# Patient Record
Sex: Male | Born: 1955
Health system: Southern US, Community
[De-identification: ages and names within clinical notes are randomized; demographics above are authoritative.]

## PROBLEM LIST (undated history)

## (undated) DIAGNOSIS — K2270 Barrett's esophagus without dysplasia: Secondary | ICD-10-CM

## (undated) DIAGNOSIS — F10931 Alcohol use, unspecified with withdrawal delirium: Secondary | ICD-10-CM

## (undated) DIAGNOSIS — R519 Headache, unspecified: Secondary | ICD-10-CM

## (undated) DIAGNOSIS — F32A Depression, unspecified: Secondary | ICD-10-CM

## (undated) DIAGNOSIS — I2510 Atherosclerotic heart disease of native coronary artery without angina pectoris: Secondary | ICD-10-CM

## (undated) DIAGNOSIS — J449 Chronic obstructive pulmonary disease, unspecified: Secondary | ICD-10-CM

## (undated) DIAGNOSIS — F1020 Alcohol dependence, uncomplicated: Secondary | ICD-10-CM

## (undated) DIAGNOSIS — I10 Essential (primary) hypertension: Secondary | ICD-10-CM

## (undated) DIAGNOSIS — I639 Cerebral infarction, unspecified: Secondary | ICD-10-CM

## (undated) DIAGNOSIS — Z5900 Homelessness unspecified: Secondary | ICD-10-CM

## (undated) DIAGNOSIS — K219 Gastro-esophageal reflux disease without esophagitis: Secondary | ICD-10-CM

## (undated) DIAGNOSIS — F10231 Alcohol dependence with withdrawal delirium: Secondary | ICD-10-CM

## (undated) DIAGNOSIS — F329 Major depressive disorder, single episode, unspecified: Secondary | ICD-10-CM

## (undated) DIAGNOSIS — R51 Headache: Secondary | ICD-10-CM

## (undated) HISTORY — PX: FINGER SURGERY: SHX640

## (undated) HISTORY — PX: OTHER SURGICAL HISTORY: SHX169

## (undated) HISTORY — PX: BACK SURGERY: SHX140

## (undated) HISTORY — PX: SHOULDER SURGERY: SHX246

## (undated) HISTORY — PX: HERNIA REPAIR: SHX51

## (undated) HISTORY — PX: GASTRECTOMY: SHX58

---
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
## 2000-07-30 DIAGNOSIS — I2510 Atherosclerotic heart disease of native coronary artery without angina pectoris: Secondary | ICD-10-CM

## 2000-07-30 HISTORY — DX: Atherosclerotic heart disease of native coronary artery without angina pectoris: I25.10

## 2004-07-30 DIAGNOSIS — I639 Cerebral infarction, unspecified: Secondary | ICD-10-CM

## 2004-07-30 HISTORY — DX: Cerebral infarction, unspecified: I63.9

## 2013-01-29 ENCOUNTER — Emergency Department (HOSPITAL_COMMUNITY)
Admission: EM | Admit: 2013-01-29 | Discharge: 2013-01-30 | Disposition: A | Discharge status: Other Acute Inpatient Hospital | Payer: Medicaid Other | Attending: Emergency Medicine | Admitting: Emergency Medicine

## 2013-01-29 ENCOUNTER — Emergency Department (HOSPITAL_COMMUNITY): Payer: Medicaid Other

## 2013-01-29 ENCOUNTER — Encounter (HOSPITAL_COMMUNITY): Payer: Self-pay | Admitting: Emergency Medicine

## 2013-01-29 DIAGNOSIS — R062 Wheezing: Secondary | ICD-10-CM | POA: Insufficient documentation

## 2013-01-29 DIAGNOSIS — R45851 Suicidal ideations: Secondary | ICD-10-CM

## 2013-01-29 DIAGNOSIS — T438X2A Poisoning by other psychotropic drugs, intentional self-harm, initial encounter: Secondary | ICD-10-CM | POA: Insufficient documentation

## 2013-01-29 DIAGNOSIS — F329 Major depressive disorder, single episode, unspecified: Secondary | ICD-10-CM | POA: Insufficient documentation

## 2013-01-29 DIAGNOSIS — Z8719 Personal history of other diseases of the digestive system: Secondary | ICD-10-CM | POA: Insufficient documentation

## 2013-01-29 DIAGNOSIS — I10 Essential (primary) hypertension: Secondary | ICD-10-CM | POA: Insufficient documentation

## 2013-01-29 DIAGNOSIS — R609 Edema, unspecified: Secondary | ICD-10-CM | POA: Insufficient documentation

## 2013-01-29 DIAGNOSIS — Z79899 Other long term (current) drug therapy: Secondary | ICD-10-CM | POA: Insufficient documentation

## 2013-01-29 DIAGNOSIS — Z8739 Personal history of other diseases of the musculoskeletal system and connective tissue: Secondary | ICD-10-CM | POA: Insufficient documentation

## 2013-01-29 DIAGNOSIS — F3289 Other specified depressive episodes: Secondary | ICD-10-CM | POA: Insufficient documentation

## 2013-01-29 DIAGNOSIS — T43502A Poisoning by unspecified antipsychotics and neuroleptics, intentional self-harm, initial encounter: Secondary | ICD-10-CM | POA: Insufficient documentation

## 2013-01-29 DIAGNOSIS — Z8711 Personal history of peptic ulcer disease: Secondary | ICD-10-CM | POA: Insufficient documentation

## 2013-01-29 DIAGNOSIS — F1010 Alcohol abuse, uncomplicated: Secondary | ICD-10-CM | POA: Insufficient documentation

## 2013-01-29 HISTORY — DX: Depression, unspecified: F32.A

## 2013-01-29 HISTORY — DX: Major depressive disorder, single episode, unspecified: F32.9

## 2013-01-29 HISTORY — DX: Essential (primary) hypertension: I10

## 2013-01-29 LAB — SALICYLATE LEVEL: Salicylate Lvl: <2 mg/dL — ABNORMAL LOW (ref 2.8–20.0)

## 2013-01-29 LAB — RAPID URINE DRUG SCREEN, HOSP PERFORMED
Barbiturates: NOT DETECTED
Benzodiazepines: NOT DETECTED
Cocaine: NOT DETECTED
Tetrahydrocannabinol: NOT DETECTED

## 2013-01-29 LAB — CBC WITH DIFFERENTIAL/PLATELET
Hemoglobin: 15.1 g/dL (ref 13.0–17.0)
Lymphocytes Relative: 38 % (ref 12–46)
Lymphs Abs: 3.3 10*3/uL (ref 0.7–4.0)
MCH: 29.7 pg (ref 26.0–34.0)
Monocytes Relative: 6 % (ref 3–12)
Neutro Abs: 4.8 10*3/uL (ref 1.7–7.7)
Neutrophils Relative %: 55 % (ref 43–77)
Platelets: 356 10*3/uL (ref 150–400)
RBC: 5.09 MIL/uL (ref 4.22–5.81)
WBC: 8.7 10*3/uL (ref 4.0–10.5)

## 2013-01-29 LAB — URINALYSIS, ROUTINE W REFLEX MICROSCOPIC
Bilirubin Urine: NEGATIVE
Ketones, ur: NEGATIVE mg/dL
Leukocytes, UA: NEGATIVE
Nitrite: NEGATIVE
Protein, ur: NEGATIVE mg/dL
pH: 5.5 (ref 5.0–8.0)

## 2013-01-29 LAB — COMPREHENSIVE METABOLIC PANEL
ALT: 13 U/L (ref 0–53)
Alkaline Phosphatase: 69 U/L (ref 39–117)
BUN: 8 mg/dL (ref 6–23)
CO2: 23 mEq/L (ref 19–32)
Chloride: 103 mEq/L (ref 96–112)
GFR calc Af Amer: >90 mL/min (ref >90)
Glucose, Bld: 82 mg/dL (ref 70–99)
Potassium: 3.8 mEq/L (ref 3.5–5.1)
Sodium: 141 mEq/L (ref 135–145)
Total Bilirubin: 0.2 mg/dL — ABNORMAL LOW (ref 0.3–1.2)
Total Protein: 7.4 g/dL (ref 6.0–8.3)

## 2013-01-29 LAB — ACETAMINOPHEN LEVEL: Acetaminophen (Tylenol), Serum: <15 ug/mL (ref 10–30)

## 2013-01-29 LAB — POCT I-STAT TROPONIN I

## 2013-01-29 MED ORDER — IPRATROPIUM BROMIDE 0.02 % IN SOLN
0.5000 mg | Freq: Once | RESPIRATORY_TRACT | Status: AC
  Administered 2013-01-29: 0.5 mg via RESPIRATORY_TRACT
  Filled 2013-01-29: qty 2.5

## 2013-01-29 MED ORDER — ALBUTEROL SULFATE HFA 108 (90 BASE) MCG/ACT IN AERS
2.0000 | INHALATION_SPRAY | Freq: Four times a day (QID) | RESPIRATORY_TRACT | Status: DC | PRN
  Administered 2013-01-30: 2 via RESPIRATORY_TRACT
  Filled 2013-01-29: qty 6.7

## 2013-01-29 MED ORDER — SODIUM CHLORIDE 0.9 % IV BOLUS (SEPSIS)
1000.0000 mL | Freq: Once | INTRAVENOUS | Status: AC
  Administered 2013-01-29: 1000 mL via INTRAVENOUS

## 2013-01-29 MED ORDER — ACETAMINOPHEN 325 MG PO TABS: 650.0000 mg | ORAL_TABLET | ORAL | Status: DC | PRN

## 2013-01-29 MED ORDER — LORAZEPAM 1 MG PO TABS: 1.0000 mg | ORAL_TABLET | Freq: Three times a day (TID) | ORAL | Status: DC | PRN

## 2013-01-29 MED ORDER — METOPROLOL TARTRATE 25 MG PO TABS
25.0000 mg | ORAL_TABLET | Freq: Two times a day (BID) | ORAL | Status: DC
  Administered 2013-01-29 – 2013-01-30 (×2): 25 mg via ORAL
  Filled 2013-01-29 (×2): qty 1

## 2013-01-29 MED ORDER — PREDNISONE 50 MG PO TABS
60.0000 mg | ORAL_TABLET | Freq: Once | ORAL | Status: AC
  Administered 2013-01-29: 60 mg via ORAL
  Filled 2013-01-29: qty 1

## 2013-01-29 MED ORDER — ALUM & MAG HYDROXIDE-SIMETH 200-200-20 MG/5ML PO SUSP: 30.0000 mL | ORAL | Status: DC | PRN

## 2013-01-29 MED ORDER — NICOTINE 21 MG/24HR TD PT24
21.0000 mg | MEDICATED_PATCH | Freq: Every day | TRANSDERMAL | Status: DC
  Administered 2013-01-29 – 2013-01-30 (×2): 21 mg via TRANSDERMAL
  Filled 2013-01-29 (×2): qty 1

## 2013-01-29 MED ORDER — ESCITALOPRAM OXALATE 10 MG PO TABS
10.0000 mg | ORAL_TABLET | Freq: Every day | ORAL | Status: DC
  Administered 2013-01-29 – 2013-01-30 (×2): 10 mg via ORAL
  Filled 2013-01-29 (×2): qty 1

## 2013-01-29 MED ORDER — VITAMIN B-1 100 MG PO TABS
100.0000 mg | ORAL_TABLET | Freq: Every day | ORAL | Status: DC
  Administered 2013-01-29 – 2013-01-30 (×2): 100 mg via ORAL
  Filled 2013-01-29 (×2): qty 1

## 2013-01-29 MED ORDER — ONDANSETRON HCL 4 MG PO TABS: 4.0000 mg | ORAL_TABLET | Freq: Three times a day (TID) | ORAL | Status: DC | PRN

## 2013-01-29 MED ORDER — ALBUTEROL SULFATE (5 MG/ML) 0.5% IN NEBU
5.0000 mg | INHALATION_SOLUTION | Freq: Once | RESPIRATORY_TRACT | Status: AC
  Administered 2013-01-29: 5 mg via RESPIRATORY_TRACT
  Filled 2013-01-29: qty 1

## 2013-01-29 MED ORDER — TRAZODONE HCL 50 MG PO TABS
150.0000 mg | ORAL_TABLET | Freq: Every day | ORAL | Status: DC
  Administered 2013-01-29: 150 mg via ORAL
  Filled 2013-01-29: qty 1

## 2013-01-29 MED ORDER — LORAZEPAM 1 MG PO TABS: 1.0000 mg | ORAL_TABLET | Freq: Four times a day (QID) | ORAL | Status: DC | PRN

## 2013-01-29 MED ORDER — FOLIC ACID 1 MG PO TABS
1.0000 mg | ORAL_TABLET | Freq: Every day | ORAL | Status: DC
  Administered 2013-01-29 – 2013-01-30 (×2): 1 mg via ORAL
  Filled 2013-01-29 (×2): qty 1

## 2013-01-29 MED ORDER — ADULT MULTIVITAMIN W/MINERALS CH
1.0000 | ORAL_TABLET | Freq: Every day | ORAL | Status: DC
  Administered 2013-01-29 – 2013-01-30 (×2): 1 via ORAL
  Filled 2013-01-29 (×2): qty 1

## 2013-01-29 MED ORDER — LORAZEPAM 2 MG/ML IJ SOLN: 1.0000 mg | Freq: Four times a day (QID) | INTRAMUSCULAR | Status: DC | PRN

## 2013-01-29 MED ORDER — IPRATROPIUM BROMIDE 0.02 % IN SOLN
0.5000 mg | Freq: Once | RESPIRATORY_TRACT | Status: AC
Start: 2013-01-29 — End: 2013-01-29
  Administered 2013-01-29: 0.5 mg via RESPIRATORY_TRACT
  Filled 2013-01-29: qty 2.5

## 2013-01-29 MED ORDER — ZOLPIDEM TARTRATE 5 MG PO TABS: 5.0000 mg | ORAL_TABLET | Freq: Every evening | ORAL | Status: DC | PRN

## 2013-01-29 MED ORDER — THIAMINE HCL 100 MG/ML IJ SOLN: 100.0000 mg | Freq: Every day | INTRAMUSCULAR | Status: DC

## 2013-01-29 MED ORDER — ARIPIPRAZOLE 5 MG PO TABS
5.0000 mg | ORAL_TABLET | Freq: Every day | ORAL | Status: DC
  Administered 2013-01-29 – 2013-01-30 (×2): 5 mg via ORAL
  Filled 2013-01-29 (×3): qty 1

## 2013-01-29 NOTE — ED Notes (Signed)
Ring and earring given to niece.  Pt put clothing in belongings bag.  Belongings placed in locker 30.  No cell phone or money with pt on arrival.

## 2013-01-29 NOTE — ED Notes (Signed)
Patient OD's on own meds and lots of beer last nite. States it was a "mistake". Lives at home w/mother. States he has lost everything, lost his son, his wife and everything else. Feels overwhelmed w/everything going on. Wants to get back on his feet and take care of himself.

## 2013-01-29 NOTE — ED Notes (Signed)
Per EMS.  Pt attempted kill self by taking remaining pills from home prescriptions.  Pt told EMs and [employer] he took 16 trazadone, 15 metoprolol, 10 abilify, gabapentin and omiprazole.  Pt told [employer] he took pills at 5pm last night along with 6 pack of beer.  Pt was woken up this am by niece.  Pt recently moved here from [facility].

## 2013-01-29 NOTE — ED Notes (Signed)
Patient aware that we need a urine specimen-urinal at bedside

## 2013-01-29 NOTE — ED Provider Notes (Signed)
History    CSN: [id number] Arrival date & time 01/29/13  1211  First MD Initiated Contact with Patient 01/29/13 1214     No chief complaint on file.  (Consider location/radiation/quality/duration/timing/severity/associated sxs/prior Treatment) HPI  57 year old male was brought here via EMS for evaluations of intentional overdose.  Patient was brought here when his family member found him to be unresponsive this morning. Patient is currently awake and able to give full history. Patient recently moved from [place]. He reports feeling depressed because his wife died 2 years ago and his son died 5 years ago. He felt lonely. Yesterday afternoon he decided to take the rest of his home medication in attempt to go to sleep "and not wake up".  Patient reports taking 12 trazodone, 20 omeprazole, 10 metoprolol, 5 abilify, and 10 gabapentin. He also drank a 12 pack of beer. Patient also has homicidal ideation, wanting to shoot his brother-in-law who lives in [place].  He does not own a gun currently. Patient denies any hallucination. Admits to history of alcohol abuse and states "I have a touch of cirrhosis".  Patient also endorsed chronic chest pain and shortness of breath, history of COPD, and also endorsed chronic abdominal pain. No new pain. No other complaints.  Has prior hx of SI with self cutting behaviors.   No past medical history on file. No past surgical history on file. No family history on file. History  Substance Use Topics  . Smoking status: Not on file  . Smokeless tobacco: Not on file  . Alcohol Use: Not on file    Review of Systems  All other systems reviewed and are negative.    Allergies  Review of patient's allergies indicates not on file.  Home Medications  No current outpatient prescriptions on file. There were no vitals taken for this visit. Physical Exam  Nursing note and vitals reviewed. Constitutional: He is oriented to person, place, and time. No distress.  Patient  looks disheveled, older than stated age.  HENT:  Head: Normocephalic and atraumatic.  Mouth is dry, edentulous  Eyes: Conjunctivae are normal.  Neck: Neck supple.  Cardiovascular: Normal rate and regular rhythm.  Exam reveals no gallop and no friction rub.   No murmur heard. Pulmonary/Chest: Effort normal. He has wheezes.  Mild scattered wheezes with decreased breath sounds to low lung base  Abdominal: Soft.  Generalized mild abdominal tenderness without guarding or rebound tenderness. Well-healing midline abdominal surgical scar without hernia noted. No trauma.  Musculoskeletal: He exhibits edema.  Neurological: He is alert and oriented to person, place, and time. He has normal strength. No cranial nerve deficit. GCS eye subscore is 4. GCS verbal subscore is 5. GCS motor subscore is 6.  Skin: Skin is warm. No rash noted.  Linear scars noted to L forearm, no infection.  Psychiatric: He has a normal mood and affect. His speech is normal. He is slowed. Thought content is not paranoid. He expresses no homicidal and no suicidal ideation.    ED Course  Procedures (including critical care time)   Date: 01/29/2013  Rate: 97  Rhythm: normal sinus rhythm  QRS Axis: normal  Intervals: normal  ST/T Wave abnormalities: normal  Conduction Disutrbances: none  Narrative Interpretation:   Old EKG Reviewed: no prior for comparison     12:39 PM Patient here for intentional overdose. He took a multitude of his home medications yesterday afternoon. Will consult Poison Control Center for further recommendation. The report having SI/HI, but no active SI. He  is afebrile with stable normal vital sign. He has mild tachypnea and mild tachycardic with some assessment muscle use. Lung with wheezes. Albuterol and Atrovent given for symptoms and treatment. Workup initiated. Will consult team and had telepsych once patient is medically cleared.  3:42 PM ETOH 322, however clinically cleared.  Pt is medically  cleared considering normal VS and OD happened 24 hrs ago.  I have consulted with ACT and also placed call to telepsych for further management.  Pt is currently here voluntarily.  However, may need IVC if required.  Care discussed with attending.  Psych hold and Med Rec filled.    7:46 PM telepsych has evaluated pt and recommend CIWA protocol, lexapro 10mg  PO daily, ativan PRN, and for admission to inpt psychiatric unit.  Order placed.  Labs Reviewed  CBC WITH DIFFERENTIAL - Abnormal; Notable for the following:    RDW 15.6 (*)    All other components within normal limits  COMPREHENSIVE METABOLIC PANEL - Abnormal; Notable for the following:    Total Bilirubin 0.2 (*)    All other components within normal limits  ETHANOL - Abnormal; Notable for the following:    Alcohol, Ethyl (B) 322 (*)    All other components within normal limits  SALICYLATE LEVEL - Abnormal; Notable for the following:    Salicylate Lvl <2.0 (*)    All other components within normal limits  URINALYSIS, ROUTINE W REFLEX MICROSCOPIC  URINE RAPID DRUG SCREEN (HOSP PERFORMED)  ACETAMINOPHEN LEVEL  POCT I-STAT TROPONIN I   No results found. 1. Suicidal ideation   2. Alcohol abuse     MDM  BP 118/63  Pulse 100  Temp(Src) 97.8 F (36.6 C) (Oral)  Resp 18  SpO2 97%  I have reviewed nursing notes and vital signs. I personally reviewed the imaging tests through PACS system  I reviewed available ER/hospitalization records thought the EMR   Fayrene Helper, PA-C 01/29/13 1545  Fayrene Helper, PA-C 01/29/13 1946

## 2013-01-29 NOTE — ED Provider Notes (Signed)
Medical screening examination/treatment/procedure(s) were conducted as a shared visit with non-physician practitioner(s) and myself.  I personally evaluated the patient during the encounter.  57yo M, c/o OD on multiple home meds last night approx 5pm with +etoh. Pt's family states they "woke him up" this morning.  States he took these meds in [place].  States he "didn't want to wake up."  Has hx of same "when I was in [place]" and was admitted to a mental health facility at that time. VSS, NAD, awake/alert, talking with family at bedside, resps easy, abd soft/NT, +SI.  Etoh 322, labs otherwise unremarkable. Will have ACT eval to admit.     Results for orders placed during the hospital encounter of 01/29/13  CBC WITH DIFFERENTIAL      Result Value Range   WBC 8.7  4.0 - 10.5 K/uL   RBC 5.09  4.22 - 5.81 MIL/uL   Hemoglobin 15.1  13.0 - 17.0 g/dL   HCT 98.1  19.1 - 47.8 %   MCV 86.2  78.0 - 100.0 fL   MCH 29.7  26.0 - 34.0 pg   MCHC 34.4  30.0 - 36.0 g/dL   RDW 29.5 (*) 62.1 - 30.8 %   Platelets 356  150 - 400 K/uL   Neutrophils Relative % 55  43 - 77 %   Neutro Abs 4.8  1.7 - 7.7 K/uL   Lymphocytes Relative 38  12 - 46 %   Lymphs Abs 3.3  0.7 - 4.0 K/uL   Monocytes Relative 6  3 - 12 %   Monocytes Absolute 0.5  0.1 - 1.0 K/uL   Eosinophils Relative 0  0 - 5 %   Eosinophils Absolute 0.0  0.0 - 0.7 K/uL   Basophils Relative 1  0 - 1 %   Basophils Absolute 0.1  0.0 - 0.1 K/uL  COMPREHENSIVE METABOLIC PANEL      Result Value Range   Sodium 141  135 - 145 mEq/L   Potassium 3.8  3.5 - 5.1 mEq/L   Chloride 103  96 - 112 mEq/L   CO2 23  19 - 32 mEq/L   Glucose, Bld 82  70 - 99 mg/dL   BUN 8  6 - 23 mg/dL   Creatinine, Ser 6.57  0.50 - 1.35 mg/dL   Calcium 8.8  8.4 - 84.6 mg/dL   Total Protein 7.4  6.0 - 8.3 g/dL   Albumin 3.6  3.5 - 5.2 g/dL   AST 22  0 - 37 U/L   ALT 13  0 - 53 U/L   Alkaline Phosphatase 69  39 - 117 U/L   Total Bilirubin 0.2 (*) 0.3 - 1.2 mg/dL   GFR calc non Af Amer >90   >90 mL/min   GFR calc Af Amer >90  >90 mL/min  URINALYSIS, ROUTINE W REFLEX MICROSCOPIC      Result Value Range   Color, Urine YELLOW  YELLOW   APPearance CLEAR  CLEAR   Specific Gravity, Urine 1.011  1.005 - 1.030   pH 5.5  5.0 - 8.0   Glucose, UA NEGATIVE  NEGATIVE mg/dL   Hgb urine dipstick NEGATIVE  NEGATIVE   Bilirubin Urine NEGATIVE  NEGATIVE   Ketones, ur NEGATIVE  NEGATIVE mg/dL   Protein, ur NEGATIVE  NEGATIVE mg/dL   Urobilinogen, UA 0.2  0.0 - 1.0 mg/dL   Nitrite NEGATIVE  NEGATIVE   Leukocytes, UA NEGATIVE  NEGATIVE  ETHANOL      Result Value Range  Alcohol, Ethyl (B) 322 (*) 0 - 11 mg/dL  URINE RAPID DRUG SCREEN (HOSP PERFORMED)      Result Value Range   Opiates NONE DETECTED  NONE DETECTED   Cocaine NONE DETECTED  NONE DETECTED   Benzodiazepines NONE DETECTED  NONE DETECTED   Amphetamines NONE DETECTED  NONE DETECTED   Tetrahydrocannabinol NONE DETECTED  NONE DETECTED   Barbiturates NONE DETECTED  NONE DETECTED  ACETAMINOPHEN LEVEL      Result Value Range   Acetaminophen (Tylenol), Serum <15.0  10 - 30 ug/mL  SALICYLATE LEVEL      Result Value Range   Salicylate Lvl <2.0 (*) 2.8 - 20.0 mg/dL  POCT I-STAT TROPONIN I      Result Value Range   Troponin i, poc 0.02  0.00 - 0.08 ng/mL   Comment 3                  Laray Anger, DO 01/29/13 1517

## 2013-01-29 NOTE — ED Notes (Signed)
[id number]<ID> Expected date:<BR> Expected time:<BR> Means of arrival:<BR> Comments:<BR> EMS-OD etoh/RX

## 2013-01-29 NOTE — ED Provider Notes (Signed)
Medical screening examination/treatment/procedure(s) were performed by non-physician practitioner and as supervising physician I was immediately available for consultation/collaboration. Please see my previous note.  Laray Anger, DO 01/29/13 2014

## 2013-01-29 NOTE — Progress Notes (Signed)
Pt confirms pcp is george osei bonsu EPIC updated

## 2013-01-29 NOTE — ED Notes (Signed)
telepsych at bedside.

## 2013-01-29 NOTE — BH Assessment (Signed)
Assessment Note   Samuel Reyes is an 57 y.o. male.   Pt overdosed on multiple drugs (see MD notes) in an attempt to end life.  Pt wanted to die.  "I lost my wife two yrs ago and my son 5 yrs ago.  I got nothing."  Pt depressed and "I did not really think about it.  I just wanted to sleep and never wake up."  Pt denies HI and AVH and SA.  Pt consumes alcohol 2x monthly.  Pt lives with mother and moved back to [place] from [place] after wife died.    "I am not sure I want to die now.  Maybe I did something stupid.  I don't know, but I need some help."  Pt cannot contract for safety at this time.    Pt made fair eye contact, Ox3, speech clear and affect flat and depressed.  Recommend Inptx due to attempt, plan and intent to harm self.  Pt has stressor and is depressed and cannot contract for safety.  Axis I: Major Depression, Recurrent severe Axis II: Deferred Axis III:  Past Medical History  Diagnosis Date  . Cirrhosis   . Degenerative joint disease   . Hypertension   . Gastric ulcer   . Depression    Axis IV: problems related to social environment and problems with primary support group Axis V: 41-50 serious symptoms  Past Medical History:  Past Medical History  Diagnosis Date  . Cirrhosis   . Degenerative joint disease   . Hypertension   . Gastric ulcer   . Depression     Past Surgical History  Procedure Laterality Date  . Gastrectomy    . Hernia repair    . Back surgery    . Shoulder surgery      Family History: History reviewed. No pertinent family history.  Social History:  reports that he has been smoking.  He does not have any smokeless tobacco history on file. He reports that  drinks alcohol. He reports that he does not use illicit drugs.  Additional Social History:  Alcohol / Drug Use Pain Medications: na Prescriptions: na Over the Counter: na History of alcohol / drug use?: No history of alcohol / drug abuse Longest period of sobriety (when/how long):  na  CIWA: CIWA-Ar BP: 121/76 mmHg Pulse Rate: 99 Nausea and Vomiting: no nausea and no vomiting Tactile Disturbances: none Tremor: no tremor Auditory Disturbances: not present Paroxysmal Sweats: no sweat visible Visual Disturbances: not present Anxiety: mildly anxious Headache, Fullness in Head: none present Agitation: normal activity Orientation and Clouding of Sensorium: oriented and can do serial additions CIWA-Ar Total: 1 COWS:    Allergies:  Allergies  Allergen Reactions  . Penicillins Rash    Home Medications:  (Not in a hospital admission)  OB/GYN Status:  No LMP for male patient.  General Assessment Data Location of Assessment: [facility] Living Arrangements: Parent Can pt return to current living arrangement?: Yes Admission Status: Voluntary Is patient capable of signing voluntary admission?: Yes Transfer from: Acute Hospital Referral Source: MD  Education Status Is patient currently in school?: No  Risk to self Suicidal Ideation: Yes-Currently Present Suicidal Intent: Yes-Currently Present Is patient at risk for suicide?: Yes Suicidal Plan?: Yes-Currently Present Specify Current Suicidal Plan: OD Access to Means: Yes Specify Access to Suicidal Means: has access to meds What has been your use of drugs/alcohol within the last 12 months?: consumes alcohol 2x per mth per pt Previous Attempts/Gestures: Yes How many times?:  1 Other Self Harm Risks: na Triggers for Past Attempts: Anniversary;Unpredictable Intentional Self Injurious Behavior: None Family Suicide History: No Recent stressful life event(s): Other (Comment) (wife died 2 yrs ago and son died 5 yrs ago) Persecutory voices/beliefs?: No Depression: Yes Depression Symptoms: Tearfulness;Isolating;Fatigue;Guilt;Loss of interest in usual pleasures;Feeling worthless/self pity;Feeling angry/irritable;Insomnia;Despondent Substance abuse history and/or treatment for substance abuse?: No Suicide prevention  information given to non-admitted patients: Not applicable  Risk to Others Homicidal Ideation: No Thoughts of Harm to Others: No Current Homicidal Intent: No Current Homicidal Plan: No Access to Homicidal Means: No Identified Victim: na History of harm to others?: No Assessment of Violence: None Noted Violent Behavior Description: cooperative Does patient have access to weapons?: No Criminal Charges Pending?: No Does patient have a court date: No  Psychosis Hallucinations: None noted Delusions: None noted  Mental Status Report Appear/Hygiene: Disheveled Eye Contact: Fair Motor Activity: Unremarkable Speech: Logical/coherent Level of Consciousness: Alert Mood: Depressed;Anxious Affect: Anxious;Blunted;Depressed Anxiety Level: Minimal Thought Processes: Coherent Judgement: Impaired Orientation: Person;Place;Situation;Appropriate for developmental age Obsessive Compulsive Thoughts/Behaviors: None  Cognitive Functioning Concentration: Decreased Memory: Recent Intact;Remote Intact IQ: Average Insight: Poor Impulse Control: Poor Appetite: Fair Weight Loss: 0 Weight Gain: 0 Sleep: Decreased Total Hours of Sleep: 2 Vegetative Symptoms: None  ADLScreening [facility] Assessment Services) Patient's cognitive ability adequate to safely complete daily activities?: Yes Patient able to express need for assistance with ADLs?: Yes Independently performs ADLs?: Yes (appropriate for developmental age)  Abuse/Neglect [facility]) Physical Abuse: Denies Verbal Abuse: Denies Sexual Abuse: Denies  Prior Inpatient Therapy Prior Inpatient Therapy: No Prior Therapy Dates: na Prior Therapy Facilty/Provider(s): na Reason for Treatment: na  Prior Outpatient Therapy Prior Outpatient Therapy: No Prior Therapy Dates: na Prior Therapy Facilty/Provider(s): na Reason for Treatment: na  ADL Screening (condition at time of admission) Patient's cognitive ability adequate to safely complete daily  activities?: Yes Is the patient deaf or have difficulty hearing?: Yes Does the patient have difficulty seeing, even when wearing glasses/contacts?: Yes Does the patient have difficulty concentrating, remembering, or making decisions?: Yes Patient able to express need for assistance with ADLs?: Yes Does the patient have difficulty dressing or bathing?: Yes Independently performs ADLs?: Yes (appropriate for developmental age) Does the patient have difficulty walking or climbing stairs?: Yes Weakness of Legs: None Weakness of Arms/Hands: None  Home Assistive Devices/Equipment Home Assistive Devices/Equipment: None  Therapy Consults (therapy consults require a physician order) PT Evaluation Needed: No OT Evalulation Needed: No SLP Evaluation Needed: No Abuse/Neglect Assessment (Assessment to be complete while patient is alone) Physical Abuse: Denies Verbal Abuse: Denies Sexual Abuse: Denies Exploitation of patient/patient's resources: Denies Self-Neglect: Denies Values / Beliefs Cultural Requests During Hospitalization: None Spiritual Requests During Hospitalization: None Consults Spiritual Care Consult Needed: No Social Work Consult Needed: No Merchant navy officer (For Healthcare) Advance Directive: Patient does not have advance directive Pre-existing out of facility DNR order (yellow form or pink MOST form): No    Additional Information 1:1 In Past 12 Months?: No CIRT Risk: No Elopement Risk: No Does patient have medical clearance?: Yes     Disposition:  Disposition Initial Assessment Completed for this Encounter: Yes Disposition of Patient: Inpatient treatment program Type of inpatient treatment program: Adult  On Site Evaluation by:   Reviewed with Physician:     Titus Mould, Eppie Gibson 01/29/2013 11:01 PM

## 2013-01-30 ENCOUNTER — Encounter (HOSPITAL_COMMUNITY): Payer: Self-pay | Admitting: *Deleted

## 2013-01-30 ENCOUNTER — Inpatient Hospital Stay (HOSPITAL_COMMUNITY)
Admission: AD | Admit: 2013-01-30 | Discharge: 2013-02-04 | DRG: 430 | Disposition: A | Discharge status: Home / Self Care | Payer: Medicaid Other | Source: Intra-hospital | Attending: Psychiatry | Admitting: Psychiatry

## 2013-01-30 DIAGNOSIS — I10 Essential (primary) hypertension: Secondary | ICD-10-CM | POA: Diagnosis present

## 2013-01-30 DIAGNOSIS — J4489 Other specified chronic obstructive pulmonary disease: Secondary | ICD-10-CM | POA: Diagnosis present

## 2013-01-30 DIAGNOSIS — F332 Major depressive disorder, recurrent severe without psychotic features: Principal | ICD-10-CM | POA: Diagnosis present

## 2013-01-30 DIAGNOSIS — J449 Chronic obstructive pulmonary disease, unspecified: Secondary | ICD-10-CM | POA: Diagnosis present

## 2013-01-30 DIAGNOSIS — Z598 Other problems related to housing and economic circumstances: Secondary | ICD-10-CM

## 2013-01-30 DIAGNOSIS — I059 Rheumatic mitral valve disease, unspecified: Secondary | ICD-10-CM | POA: Diagnosis present

## 2013-01-30 DIAGNOSIS — M1990 Unspecified osteoarthritis, unspecified site: Secondary | ICD-10-CM | POA: Diagnosis present

## 2013-01-30 DIAGNOSIS — F329 Major depressive disorder, single episode, unspecified: Secondary | ICD-10-CM

## 2013-01-30 DIAGNOSIS — K259 Gastric ulcer, unspecified as acute or chronic, without hemorrhage or perforation: Secondary | ICD-10-CM | POA: Diagnosis present

## 2013-01-30 DIAGNOSIS — F17200 Nicotine dependence, unspecified, uncomplicated: Secondary | ICD-10-CM | POA: Diagnosis present

## 2013-01-30 DIAGNOSIS — Z5987 Material hardship due to limited financial resources, not elsewhere classified: Secondary | ICD-10-CM

## 2013-01-30 DIAGNOSIS — F411 Generalized anxiety disorder: Secondary | ICD-10-CM | POA: Diagnosis present

## 2013-01-30 DIAGNOSIS — R45851 Suicidal ideations: Secondary | ICD-10-CM

## 2013-01-30 DIAGNOSIS — K449 Diaphragmatic hernia without obstruction or gangrene: Secondary | ICD-10-CM | POA: Diagnosis present

## 2013-01-30 DIAGNOSIS — K7460 Unspecified cirrhosis of liver: Secondary | ICD-10-CM | POA: Diagnosis present

## 2013-01-30 DIAGNOSIS — K219 Gastro-esophageal reflux disease without esophagitis: Secondary | ICD-10-CM | POA: Diagnosis present

## 2013-01-30 DIAGNOSIS — Z634 Disappearance and death of family member: Secondary | ICD-10-CM

## 2013-01-30 MED ORDER — METOPROLOL TARTRATE 25 MG PO TABS
25.0000 mg | ORAL_TABLET | Freq: Two times a day (BID) | ORAL | Status: DC
  Administered 2013-01-30 – 2013-02-04 (×10): 25 mg via ORAL
  Filled 2013-01-30 (×15): qty 1

## 2013-01-30 MED ORDER — ALUM & MAG HYDROXIDE-SIMETH 200-200-20 MG/5ML PO SUSP: 30.0000 mL | ORAL | Status: DC | PRN

## 2013-01-30 MED ORDER — CHLORDIAZEPOXIDE HCL 25 MG PO CAPS
25.0000 mg | ORAL_CAPSULE | Freq: Four times a day (QID) | ORAL | Status: AC
Start: 2013-01-31 — End: 2013-02-01
  Administered 2013-01-31 – 2013-02-01 (×5): 25 mg via ORAL
  Filled 2013-01-30 (×5): qty 1

## 2013-01-30 MED ORDER — ACETAMINOPHEN 325 MG PO TABS
650.0000 mg | ORAL_TABLET | Freq: Four times a day (QID) | ORAL | Status: DC | PRN
  Administered 2013-01-30 – 2013-02-04 (×6): 650 mg via ORAL

## 2013-01-30 MED ORDER — MAGNESIUM HYDROXIDE 400 MG/5ML PO SUSP: 30.0000 mL | Freq: Every day | ORAL | Status: DC | PRN

## 2013-01-30 MED ORDER — ONDANSETRON 4 MG PO TBDP: 4.0000 mg | ORAL_TABLET | Freq: Four times a day (QID) | ORAL | Status: AC | PRN

## 2013-01-30 MED ORDER — CHLORDIAZEPOXIDE HCL 25 MG PO CAPS
25.0000 mg | ORAL_CAPSULE | Freq: Three times a day (TID) | ORAL | Status: AC
  Administered 2013-02-01 – 2013-02-02 (×3): 25 mg via ORAL
  Filled 2013-01-30 (×3): qty 1

## 2013-01-30 MED ORDER — ARIPIPRAZOLE 5 MG PO TABS
5.0000 mg | ORAL_TABLET | Freq: Every day | ORAL | Status: DC
  Administered 2013-01-30 – 2013-02-04 (×6): 5 mg via ORAL
  Filled 2013-01-30 (×4): qty 1
  Filled 2013-01-30: qty 3
  Filled 2013-01-30 (×4): qty 1

## 2013-01-30 MED ORDER — PANTOPRAZOLE SODIUM 20 MG PO TBEC
20.0000 mg | DELAYED_RELEASE_TABLET | Freq: Two times a day (BID) | ORAL | Status: DC
  Administered 2013-01-30 – 2013-02-04 (×10): 20 mg via ORAL
  Filled 2013-01-30 (×15): qty 1

## 2013-01-30 MED ORDER — CHLORDIAZEPOXIDE HCL 25 MG PO CAPS
50.0000 mg | ORAL_CAPSULE | Freq: Once | ORAL | Status: AC
  Administered 2013-01-31: 50 mg via ORAL
  Filled 2013-01-30: qty 2

## 2013-01-30 MED ORDER — VITAMIN B-1 100 MG PO TABS
100.0000 mg | ORAL_TABLET | Freq: Every day | ORAL | Status: DC
  Administered 2013-01-31 – 2013-02-04 (×5): 100 mg via ORAL
  Filled 2013-01-30 (×7): qty 1

## 2013-01-30 MED ORDER — CHLORDIAZEPOXIDE HCL 25 MG PO CAPS
25.0000 mg | ORAL_CAPSULE | Freq: Four times a day (QID) | ORAL | Status: AC | PRN
  Administered 2013-02-01: 25 mg via ORAL
  Filled 2013-01-30: qty 1

## 2013-01-30 MED ORDER — CHLORDIAZEPOXIDE HCL 25 MG PO CAPS
25.0000 mg | ORAL_CAPSULE | Freq: Every day | ORAL | Status: AC
  Administered 2013-02-04: 25 mg via ORAL
  Filled 2013-01-30: qty 1

## 2013-01-30 MED ORDER — HYDROXYZINE HCL 25 MG PO TABS: 25.0000 mg | ORAL_TABLET | Freq: Four times a day (QID) | ORAL | Status: AC | PRN

## 2013-01-30 MED ORDER — CHLORDIAZEPOXIDE HCL 25 MG PO CAPS
25.0000 mg | ORAL_CAPSULE | ORAL | Status: AC
  Administered 2013-02-02 – 2013-02-03 (×2): 25 mg via ORAL
  Filled 2013-01-30 (×2): qty 1

## 2013-01-30 MED ORDER — TRAZODONE HCL 150 MG PO TABS
150.0000 mg | ORAL_TABLET | Freq: Every day | ORAL | Status: DC
  Administered 2013-01-30: 150 mg via ORAL
  Filled 2013-01-30 (×2): qty 1
  Filled 2013-01-30: qty 3
  Filled 2013-01-30: qty 1

## 2013-01-30 MED ORDER — ADULT MULTIVITAMIN W/MINERALS CH
1.0000 | ORAL_TABLET | Freq: Every day | ORAL | Status: DC
  Administered 2013-01-31 – 2013-02-04 (×5): 1 via ORAL
  Filled 2013-01-30 (×7): qty 1

## 2013-01-30 MED ORDER — ALBUTEROL SULFATE HFA 108 (90 BASE) MCG/ACT IN AERS
2.0000 | INHALATION_SPRAY | Freq: Four times a day (QID) | RESPIRATORY_TRACT | Status: DC | PRN
  Administered 2013-01-30 – 2013-02-04 (×8): 2 via RESPIRATORY_TRACT
  Filled 2013-01-30: qty 6.7

## 2013-01-30 MED ORDER — THIAMINE HCL 100 MG/ML IJ SOLN: 100.0000 mg | Freq: Once | INTRAMUSCULAR | Status: DC

## 2013-01-30 MED ORDER — LOPERAMIDE HCL 2 MG PO CAPS: 2.0000 mg | ORAL_CAPSULE | ORAL | Status: AC | PRN

## 2013-01-30 NOTE — Progress Notes (Signed)
Patient ID: Samuel Reyes, male   DOB: September 11, 1955, 57 y.o.   MRN: [id number]  Pt skin assed and found to have Tattoos on L-arm, wrist, fingers and chest. R-wrist, arm. Pt has scars on L-ankle, shoulder, and neck. R-shoulder, throat, and abdomen. Pt admitted for SI with plan to harm self. Pt having problems dealing with a lot of issues with life. Pt lost 55yr old son 5 yrs ago to asthma "died in his sleep, went to wake him up and he was dead". Pt lost wife 2 yrs ago to chron's. Pt brother died 3 months ago to Cancer. Pt father died at the age of 37. Pt complained of HA 8 out of 10 and he received tylenol (see Mar). Pt was brought back to the unit and handed off to Nurse Fannie Knee to finish the admission.

## 2013-01-30 NOTE — ED Notes (Signed)
Report to Marylene Land at [employer] transport by security.

## 2013-01-30 NOTE — BHH Counselor (Signed)
Patient will have a bed later today after 2 females are discharged. No bed is available at this time. Assessment office staff will call when a bed is available. Support paperwork completed and faxed to [facility].

## 2013-01-31 ENCOUNTER — Encounter (HOSPITAL_COMMUNITY): Payer: Self-pay | Admitting: *Deleted

## 2013-01-31 DIAGNOSIS — F1020 Alcohol dependence, uncomplicated: Secondary | ICD-10-CM

## 2013-01-31 MED ORDER — MIRTAZAPINE 15 MG PO TABS
15.0000 mg | ORAL_TABLET | Freq: Every day | ORAL | Status: DC
  Administered 2013-01-31 – 2013-02-03 (×4): 15 mg via ORAL
  Filled 2013-01-31 (×5): qty 1
  Filled 2013-01-31: qty 4

## 2013-01-31 MED ORDER — NICOTINE 21 MG/24HR TD PT24
MEDICATED_PATCH | TRANSDERMAL | Status: AC
  Administered 2013-01-31: 21 mg
  Filled 2013-01-31: qty 1

## 2013-01-31 MED ORDER — PNEUMOCOCCAL VAC POLYVALENT 25 MCG/0.5ML IJ INJ
0.5000 mL | INJECTION | INTRAMUSCULAR | Status: AC
  Administered 2013-02-01: 0.5 mL via INTRAMUSCULAR

## 2013-01-31 MED ORDER — NICOTINE 21 MG/24HR TD PT24
21.0000 mg | MEDICATED_PATCH | Freq: Every day | TRANSDERMAL | Status: DC
  Administered 2013-01-31 – 2013-02-04 (×5): 21 mg via TRANSDERMAL
  Filled 2013-01-31 (×7): qty 1

## 2013-01-31 NOTE — Progress Notes (Signed)
Patient ID: Samuel Reyes, male   DOB: [birth year], 57 y.o.   MRN: [id number] D)  Has been out on the unit, participating in the milieu, didn't program on 300 hall this evening, stated was told he would be meeting with a grief counselor at some point during his stay. Will consider 300 hall tomorrow.    Stated detox is going fairly well, c/o headache, requested and was given tylenol and gatorade, fluids encouraged.  Pleasant, cooperative, appropriate with staff and peers.  Denies thoughts of self harm at this time. A)  Will continue to monitor for safety, encouragement and support, continue POC R)  Appreciative, safety maintained.

## 2013-01-31 NOTE — Progress Notes (Signed)
Psychoeducational Group Note  Date: 01/31/2013 Time:  1015  Group Topic/Focus:  Identifying Needs:   The focus of this group is to help patients identify their personal needs that have been historically problematic and identify healthy behaviors to address their needs.  Participation Level:  Active  Participation Quality:  Appropriate  Affect:  Appropriate  Cognitive:  Oriented  Insight: Improving  Engagement in Group:  Engaged  Additional Comments:  Attended the group and was involved throughout the entire group  Jamahl Lemmons A 

## 2013-01-31 NOTE — Progress Notes (Signed)
Goals Group Note  Date:  01/31/2013 Time: 0930 Group Topic/Focus:  Identifying  Goals: This group focuses on helping patients identify goals they want to achieve and helps them identify steps they need to make to realize these goals. Participation Level:  active Participation Quality: good Affect: flat Cognitive:  good  Insight:  good  Engagement in Group: engaged  Additional Comments:   PD RN [facility]

## 2013-01-31 NOTE — H&P (Signed)
  Pt was seen by me today and I agree with the key elements documented in H&P.  

## 2013-01-31 NOTE — BHH Group Notes (Signed)
BHH Group Notes:  (Clinical Social Work)  01/31/2013   3:00-4:00PM  Summary of Progress/Problems:   The main focus of today's process group was for the patient to identify something in their life that led to their hospitalization that they would like to change, then to discuss their motivation to change.  The Stages of Change were explained to the group, then each patient identified where they are in that process.  A scaling question was used with motivation interviewing to determine the patient's current motivation to change the identified behavior (1-10, low to high). The patient expressed that he isolates himself since he has had so many bad things to happen to him in the last few years.  He ruminates about the past a great deal, especially his son's death.  He participated fully in discussion about how all of [place] react differently to medications and to circumstances.  Type of Therapy:  Process Group  Participation Level:  Active  Participation Quality:  Attentive, Sharing and Supportive  Affect:  Depressed and Flat  Cognitive:  Oriented  Insight:  Engaged  Engagement in Therapy:  Engaged  Modes of Intervention:  Education, Motivational Interviewing   Ambrose Mantle, LCSW 01/31/2013, 4:52 PM

## 2013-01-31 NOTE — BHH Suicide Risk Assessment (Signed)
Suicide Risk Assessment  Admission Assessment     Nursing information obtained from:  Patient Demographic factors:  Male;Divorced or widowed;Caucasian;Low socioeconomic status;Unemployed Current Mental Status:  denies si , hi  Loss Factors:  Decrease in vocational status;Loss of significant relationship;Decline in physical health;Financial problems / change in socioeconomic status Historical Factors:  Prior suicide attempts;Family history of mental illness or substance abuse;Anniversary of important loss Risk Reduction Factors:  Sense of responsibility to family;Living with another person, especially a relative;Positive social support  CLINICAL FACTORS:   Alcohol/Substance Abuse/Dependencies  COGNITIVE FEATURES THAT CONTRIBUTE TO RISK:  Closed-mindedness    SUICIDE RISK:   Mild:  Suicidal ideation of limited frequency, intensity, duration, and specificity.  There are no identifiable plans, no associated intent, mild dysphoria and related symptoms, good self-control (both objective and subjective assessment), few other risk factors, and identifiable protective factors, including available and accessible social support.  PLAN OF CARE: Continue current meds  I certify that inpatient services furnished can reasonably be expected to improve the patient's condition.  Wonda Cerise 01/31/2013, 2:38 PM
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
## 2013-01-31 NOTE — Progress Notes (Signed)
D) Pt has attended the groups and interacts with his peers appropriately. Rates his depression at a 6 and his hopelessness at a 2. Denies SI and HI. Mother and sister were up here to visit this evening. Talked about the fact he has been smoking at least 45 years and that he has asthma.  A) Using therapeutic humor with Pt today. Gave Pt inforamtion and education on smoking cessation. Given respect and active listening. R) Denies SI and HI.
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
## 2013-02-01 MED ORDER — ENSURE COMPLETE PO LIQD
237.0000 mL | Freq: Two times a day (BID) | ORAL | Status: DC
  Administered 2013-02-01 – 2013-02-04 (×6): 237 mL via ORAL

## 2013-02-01 NOTE — Progress Notes (Signed)
Patient ID: Samuel Reyes, male   DOB: 1956-05-16, 57 y.o.   MRN: [id number]  S;  Seen today. Attending groups today. More calm today. Reports ETOH due to stressor mostly due to loss of her son few years ago, wants to see therapist after d/c.  General Appearance: Disheveled  Eye Contact:: Good  Speech: Clear and Coherent  Volume: Normal  Mood: Anxious and Depressed  Affect: Congruent  Thought Process: Circumstantial and Tangential  Orientation: Full (Time, Place, and Person)  Thought Content: Obsessions  Suicidal Thoughts: no  Homicidal Thoughts: No Memory: Immediate; Good  Recent; Good  Remote; Good  Judgement: Impaired  Insight: Lacking  Psychomotor Activity: Normal  Concentration: Good  Recall: Good  Akathisia: No     Assessment:  AXIS I: Major Depression, Recurrent severe and Alcohol dependence  AXIS II: Deferred  AXIS III:  Past Medical History   Diagnosis  Date   .  Cirrhosis    .  Degenerative joint disease    .  Hypertension    .  Gastric ulcer    .  Depression    .  Asthma    .  Mitral valve prolapse  2002   .  H/O hiatal hernia    .  Hiatal hernia  1982    AXIS IV: economic problems, housing problems, occupational problems, problems related to legal system/crime, problems related to social environment and problems with primary support group  AXIS V:30  Continue current meds  Treatment Plan Summary:  Daily contact with patient to assess and evaluate symptoms and progress in treatment  Medication management  Refer for outpatient followup care

## 2013-02-01 NOTE — Progress Notes (Signed)
Nutrition Note  Patient identified on the Malnutrition Screening Tool (MST) Report  Assessment:  Body mass index is 24.81 kg/(m^2). Patient meets criteria for normal body weight based on current BMI.  Ht: 5'3'' Wt: 140 lbs UBW: about 150 lbs IBW: 124 lbs  Pt reports that he had a decreased appetite at home due to depression. He does say that it has increased since admission to [facility], and that he is now consuming about half of his meals. He reported that he would like to try to gain back the weight he has lost, and would like to try Ensure Complete.  Current diet order is regular, patient is consuming approximately 50% of meals at this time. Labs and medications reviewed.   Nutritional Diagnosis: Inadequate oral intake related to depression as evidenced by weight loss of 10 lbs.  Intervention: 1- Ensure Complete po BID, each supplement provides 350 kcal and 13 grams of protein.  Monitor: Po intake, weight  If further nutrition issues arise, please consult RD.   Ebbie Latus RD, LDN

## 2013-02-01 NOTE — Progress Notes (Signed)
[facility] Group Notes:  (Nursing/MHT/Case Management/Adjunct)  Date:  01/31/2013 Time:  2000  Type of Therapy:  Psychoeducational Skills  Participation Level:  Active  Participation Quality:  Attentive  Affect:  Appropriate  Cognitive:  Appropriate  Insight:  Improving  Engagement in Group:  Improving  Modes of Intervention:  Education  Summary of Progress/Problems: The patient stated in group that he had a better day since he spent more time speaking with his peers. His goals for tomorrow are as follows: "do more", and speak with someone about setting up an appointment with a grief counselor.   Barbra Miner S 02/01/2013, 1:56 AM

## 2013-02-01 NOTE — Progress Notes (Signed)
D) Pt has attended the groups and interacts with his peers. Given the pneumonia vaccine in his left arm. Denies SI and HI and rates his depression. Gets along well with his peers. Feels pleased that his family brought his teeth to him last night. A) Given support and praise along with reassurance. Given encouragement . R) Denies SI and HI.

## 2013-02-01 NOTE — [facility] Group Notes (Signed)
[facility] Group Notes:  (Clinical Social Work)  02/01/2013   3:00-4:00PM  Summary of Progress/Problems:   The main focus of today's process group was to   identify the patient's current support system and decide on other supports that can be put in place.  The picture on workbook was used to discuss why additional supports are needed, and a hand-out was distributed with four definitions/levels of support, then used to talk about how patients have given and received all different kinds of support.  An emphasis was placed on using counselor, doctor, therapy groups, 12-step groups, and problem-specific support groups to expand supports.  The patient identified one additional support as being to get back in touch with his daughter that he has not seen in 5 years, along with his grandson, now 6yo, that he has not seen since he was 57yo.  He stated that other this, his baby sister is his sole support.  He has identified his stepfather as taking him to appointments, and even though we discussed this as being a support, he refused to accept that description.  He was also upset because he feels he has been told to gain weight, and he does not want to do so, feels good at his current weight.  Type of Therapy:  Process Group  Participation Level:  Active  Participation Quality:  Attentive and Sharing  Affect:  Blunted and Depressed  Cognitive:  Appropriate and Oriented  Insight:  Developing/Improving  Engagement in Therapy:  Engaged  Modes of Intervention:  Education,  Support and [employer], LCSW 02/01/2013, 4:51 PM

## 2013-02-02 DIAGNOSIS — F1010 Alcohol abuse, uncomplicated: Secondary | ICD-10-CM

## 2013-02-02 DIAGNOSIS — F332 Major depressive disorder, recurrent severe without psychotic features: Principal | ICD-10-CM

## 2013-02-02 MED ORDER — NAPROXEN 250 MG PO TABS
250.0000 mg | ORAL_TABLET | Freq: Two times a day (BID) | ORAL | Status: DC
  Administered 2013-02-02 – 2013-02-04 (×4): 250 mg via ORAL
  Filled 2013-02-02: qty 6
  Filled 2013-02-02 (×2): qty 1
  Filled 2013-02-02: qty 6
  Filled 2013-02-02 (×4): qty 1

## 2013-02-02 MED ORDER — FLUOXETINE HCL 10 MG PO CAPS
10.0000 mg | ORAL_CAPSULE | Freq: Every day | ORAL | Status: DC
  Administered 2013-02-02 – 2013-02-04 (×3): 10 mg via ORAL
  Filled 2013-02-02: qty 1
  Filled 2013-02-02: qty 3
  Filled 2013-02-02 (×4): qty 1

## 2013-02-02 NOTE — Progress Notes (Signed)
Adult Psychoeducational Group Note  Date:  02/02/2013 Time:  3:16 PM  Group Topic/Focus:  Wellness Toolbox:   The focus of this group is to discuss various aspects of wellness, balancing those aspects and exploring ways to increase the ability to experience wellness.  Patients will create a wellness toolbox for use upon discharge.  Participation Level:  Active  Participation Quality:  Appropriate, Attentive and Sharing  Affect:  Appropriate  Cognitive:  Alert and Appropriate  Insight: Appropriate  Engagement in Group:  Engaged  Modes of Intervention:  Discussion  Additional Comments:  Pt was appropriate and sharing while attending group. Pt shared that he plans to be around others and not isolate, this will help to improve his wellness.   Sharyn Lull 02/02/2013, 3:16 PM

## 2013-02-02 NOTE — [facility] Group Notes (Addendum)
[facility] LCSW Aftercare Discharge Planning Group Note   02/02/2013  8:45 AM  Participation Quality:  Alert and Appropriate   Mood/Affect:  Appropriate  Depression Rating:  3-4  Anxiety Rating:  3-4  Thoughts of Suicide:  Pt denies SI/HI  Will you contract for safety?   Yes  Current AVH:  No  Plan for Discharge/Comments:  Pt attended discharge planning group and actively participated in group.  CSW provided pt with today's workbook.  Pt reports having the best sleep in the last 7-8 months last night and believes the medication is helping.  Pt states that he resides in [place] with mother and will return home there.  Pt states that he doesn't have a psychiatrist or therapist and will need a referral from CSW.  CSW to make appropriate referrals. No further needs voiced by pt at this time.    Transportation Means: Pt reports access to transportation  Supports: No supports mentioned at this time  Samuel Samuel, LCSWA 02/02/2013 12:18 PM

## 2013-02-02 NOTE — [facility] Group Notes (Signed)
[facility] LCSW Group Therapy  02/02/2013  1:15 PM   Type of Therapy:  Group Therapy  Participation Level:  Active  Participation Quality:  Appropriate and Attentive  Affect:  Appropriate, Flat and Depressed  Cognitive:  Alert and Appropriate  Insight:  Developing/Improving and Engaged  Engagement in Therapy:  Developing/Improving and Engaged  Modes of Intervention:  Clarification, Confrontation, Discussion, Education, Exploration, Limit-setting, Orientation, Problem-solving, Rapport Building, Dance movement psychotherapist, Socialization and Support  Summary of Progress/Problems: Pt identified obstacles faced currently and processed barriers involved in overcoming these obstacles. Pt identified steps necessary for overcoming these obstacles and explored motivation (internal and external) for facing these difficulties head on. Pt further identified one area of concern in their lives and chose a goal to focus on for today.  Pt shared that he is faced with the obstacle of being alone.  Pt explained that he feels he has lost everything and everyone and was able to process how this impacts him today.  Pt actively participated and was engaged in group discussion.    Samuel Samuel, [place] 02/02/2013 2:43 PM

## 2013-02-02 NOTE — Progress Notes (Signed)
D:  Patient's self inventory sheet, patient sleeps well, has good appetite, high energy level, good attention span.  Rated depression and hopelessness #4.  Has experienced chilling in past 24 hours.  Denied SI.  Has experienced lightheadedness, headache, blurred vision in past 24 hours.  Pain goal 32, worst pain #7.  Plans to talk to people after discharge.  No problems taking meds after discharge. A:  Medications administered per MD orders.  Emotional support and encouragement given patient. R:  Denied SI and HI.  Denied A/V hallucinations.  Will continue to monitor patient every 15 minutes for safety.  Safety maintained.

## 2013-02-02 NOTE — Tx Team (Signed)
Interdisciplinary Treatment Plan Update (Adult)  Date: 02/02/2013  Time Reviewed:  9:45 AM  Progress in Treatment: Attending groups: Yes Participating in groups:  Yes Taking medication as prescribed:  Yes Tolerating medication:  Yes Family/Significant othe contact made: CSW assessing  Patient understands diagnosis:  Yes Discussing patient identified problems/goals with staff:  Yes Medical problems stabilized or resolved:  Yes Denies suicidal/homicidal ideation: Yes Issues/concerns per patient self-inventory:  Yes Other:  New problem(s) identified: N/A  Discharge Plan or Barriers: CSW assessing for appropriate referrals.  Reason for Continuation of Hospitalization: Anxiety Depression Medication Stabilization  Comments: N/A  Estimated length of stay: 3-5 days  For review of initial/current patient goals, please see plan of care.  Attendees: Patient:     Family:     Physician:   02/02/2013 9:46 AM   Nursing:   Cynthia Goble, RN 02/02/2013 9:46 AM   Clinical Social Worker:  Zaven Klemens Horton, LCSWA 02/02/2013 9:46 AM   Other: Laura Davis, NP 02/02/2013 9:46 AM   Other:  Maseta Dorley, MA care coordination 02/02/2013 9:46 AM   Other:  Beverly Knight, RN 02/02/2013 9:46 AM   Other:     Other:    Other:    Other:    Other:    Other:    Other:     Scribe for Treatment Team:   Horton, Sylar Voong Nicole, 02/02/2013 9:46 AM   

## 2013-02-02 NOTE — Progress Notes (Signed)
Grief and Loss Group  °Group members processed their feelings and experiences related to grief and loss. Group members shared helpful coping strategies in dealing with their grief and loss.  ° °Pt was present and attentive during group. Pt did not make any verbal responses.  ° °Samuel Reyes  °Counselor Intern  °[employer]  ° ° °

## 2013-02-02 NOTE — Progress Notes (Signed)
Recreation Therapy Notes  Date: 07.07.2014 Time: 3:00pm Location: 500 Hall Dayroom      Group Topic/Focus: Leisure Education  Participation Level: Did not attend  Korbyn Chopin L Thaily Hackworth, LRT/CTRS  Jamari Moten L 02/02/2013 3:39 PM 

## 2013-02-02 NOTE — [facility] Suicide Risk Assessment (Signed)
[facility] INPATIENT:  Family/Significant Other Suicide Prevention Education  Suicide Prevention Education:  Education Completed; Joetta Manners, sister, [phone] has been identified by the patient as the family member/significant other with whom the patient will be residing, and identified as the person(s) who will aid the patient in the event of a mental health crisis (suicidal ideations/suicide attempt).  With written consent from the patient, the family member/significant other has been provided the following suicide prevention education, prior to the and/or following the discharge of the patient.  The suicide prevention education provided includes the following:  Suicide risk factors  Suicide prevention and interventions  National Suicide Hotline telephone number  [facility] assessment telephone number  [facility] Emergency Assistance 911  [facility] and/or Residential Mobile Crisis Unit telephone number  Request made of family/significant other to:  Remove weapons (e.g., guns, rifles, knives), all items previously/currently identified as safety concern.    Remove drugs/medications (over-the-counter, prescriptions, illicit drugs), all items previously/currently identified as a safety concern.  The family member/significant other verbalizes understanding of the suicide prevention education information provided.  The family member/significant other agrees to remove the items of safety concern listed above.  Daryel Gerald B 02/02/2013, 5:35 PM

## 2013-02-02 NOTE — Progress Notes (Signed)
Patient ID: Samuel Reyes, male   DOB: 12/13/1955, 57 y.o.   MRN: [id number] D)  States has had a pretty good evening, has been out on the hall, interacting with select peers, attended group, programming on the 300 hall.  States his detox is going well, denies w/d sx tonight.  Hoping to get a good night's sleep, had some difficulty last night d/t male on hall being loud and agitatated.  Has been pleasant and cooperative, sense of humor returning. A)  Will continue to monitor for safety,  Continue POC, support and encouragement. R)  Appreciative, receptive, remains safe on unit.

## 2013-02-02 NOTE — Progress Notes (Signed)
[facility] MD Progress Note  02/02/2013 2:18 PM Samuel Reyes  MRN:  [id number] Subjective:   Patient tearful when talking about his multiple losses of family members stating "I was just trying to numb myself from the pain. I drank and took my medicine. Then I forgot I took it and took some more. When my sister found me I was not breathing." He expresses a strong interest in going to grief counseling. Patient remains very depressed and admits to not knowing hope to cope with his loss. His sleep and appetite are improving. The patient remains at risk for self harm due to limited support, alcohol abuse and depression.   Diagnosis:   Axis I: Alcohol Abuse and Major Depression, Recurrent severe Axis II: Deferred Axis III:  Past Medical History  Diagnosis Date  . Cirrhosis   . Degenerative joint disease   . Hypertension   . Gastric ulcer   . Depression   . Asthma   . Mitral valve prolapse 2002  . H/O hiatal hernia   . Hiatal hernia 1982   Axis IV: economic problems, housing problems, occupational problems, problems related to legal system/crime, problems related to social environment and problems with primary support group Axis V: 41-50 serious symptoms  ADL's:  Intact  Sleep: Good  Appetite:  Good  Suicidal Ideation:  Denies Homicidal Ideation:  Denies AEB (as evidenced by):  Psychiatric Specialty Exam: ROS  Blood pressure 120/76, pulse 96, temperature 98 F (36.7 C), temperature source Oral, resp. rate 20, height 5\' 3"  (1.6 m), weight 63.504 kg (140 lb).Body mass index is 24.81 kg/(m^2).  General Appearance: Casual  Eye Contact::  Good  Speech:  Clear and Coherent  Volume:  Normal  Mood:  Anxious and Hopeless  Affect:  Flat  Thought Process:  Goal Directed and Intact  Orientation:  Full (Time, Place, and Person)  Thought Content:  WDL  Suicidal Thoughts:  Yes.  without intent/plan  Homicidal Thoughts:  No  Memory:  Immediate;   Good Recent;   Good Remote;   Good   Judgement:  Impaired  Insight:  Shallow  Psychomotor Activity:  Normal  Concentration:  Fair  Recall:  Fair  Akathisia:  No  Handed:  Right  AIMS (if indicated):     Assets:  Communication Skills Desire for Improvement Intimacy Leisure Time Social Support Talents/Skills  Sleep:  Number of Hours: 6.25   Current Medications: Current Facility-Administered Medications  Medication Dose Route Frequency Provider Last Rate Last Dose  . acetaminophen (TYLENOL) tablet 650 mg  650 mg Oral Q6H PRN Court Joy, PA-C   650 mg at 02/01/13 1157  . albuterol (PROVENTIL HFA;VENTOLIN HFA) 108 (90 BASE) MCG/ACT inhaler 2 puff  2 puff Inhalation Q6H PRN Court Joy, PA-C   2 puff at 02/02/13 0815  . alum & mag hydroxide-simeth (MAALOX/MYLANTA) 200-200-20 MG/5ML suspension 30 mL  30 mL Oral Q4H PRN Court Joy, PA-C      . ARIPiprazole (ABILIFY) tablet 5 mg  5 mg Oral Daily Court Joy, PA-C   5 mg at 02/02/13 0817  . chlordiazePOXIDE (LIBRIUM) capsule 25 mg  25 mg Oral Q6H PRN Court Joy, PA-C   25 mg at 02/01/13 0157  . chlordiazePOXIDE (LIBRIUM) capsule 25 mg  25 mg Oral BH-qamhs Court Joy, PA-C       Followed by  . [START ON 02/04/2013] chlordiazePOXIDE (LIBRIUM) capsule 25 mg  25 mg Oral Daily Court Joy, PA-C      .  feeding supplement (ENSURE COMPLETE) liquid 237 mL  237 mL Oral BID BM Earna Coder, RD   237 mL at 02/02/13 1054  . hydrOXYzine (ATARAX/VISTARIL) tablet 25 mg  25 mg Oral Q6H PRN Court Joy, PA-C      . loperamide (IMODIUM) capsule 2-4 mg  2-4 mg Oral PRN Court Joy, PA-C      . magnesium hydroxide (MILK OF MAGNESIA) suspension 30 mL  30 mL Oral Daily PRN Court Joy, PA-C      . metoprolol tartrate (LOPRESSOR) tablet 25 mg  25 mg Oral BID Court Joy, PA-C   25 mg at 02/02/13 0819  . mirtazapine (REMERON) tablet 15 mg  15 mg Oral QHS Jorje Guild, PA-C   15 mg at 02/01/13 2117  . multivitamin with minerals tablet 1 tablet  1 tablet Oral  Daily Court Joy, PA-C   1 tablet at 02/02/13 0820  . naproxen (NAPROSYN) tablet 250 mg  250 mg Oral BID WC Fransisca Kaufmann, NP      . nicotine (NICODERM CQ - dosed in mg/24 hours) patch 21 mg  21 mg Transdermal Q0600 Nehemiah Settle, MD   21 mg at 02/02/13 0651  . ondansetron (ZOFRAN-ODT) disintegrating tablet 4 mg  4 mg Oral Q6H PRN Court Joy, PA-C      . pantoprazole (PROTONIX) EC tablet 20 mg  20 mg Oral BID Court Joy, PA-C   20 mg at 02/02/13 0820  . thiamine (B-1) injection 100 mg  100 mg Intramuscular Once Court Joy, PA-C      . thiamine (VITAMIN B-1) tablet 100 mg  100 mg Oral Daily Court Joy, PA-C   100 mg at 02/02/13 0820    Lab Results: No results found for this or any previous visit (from the past 48 hour(s)).  Physical Findings: AIMS: Facial and Oral Movements Muscles of Facial Expression: None, normal Lips and Perioral Area: None, normal Jaw: None, normal Tongue: None, normal,Extremity Movements Upper (arms, wrists, hands, fingers): None, normal Lower (legs, knees, ankles, toes): None, normal, Trunk Movements Neck, shoulders, hips: None, normal, Overall Severity Severity of abnormal movements (highest score from questions above): None, normal Incapacitation due to abnormal movements: None, normal Patient's awareness of abnormal movements (rate only patient's report): No Awareness, Dental Status Current problems with teeth and/or dentures?: No Does patient usually wear dentures?: No  CIWA:  CIWA-Ar Total: 1 COWS:  COWS Total Score: 3  Treatment Plan Summary: Daily contact with patient to assess and evaluate symptoms and progress in treatment Medication management  Plan: Continue crisis management and stabilization.  Medication management: Continue librium detox protocol. Add Prozac 10 mg daily for depressive symptoms.  Encouraged patient to attend groups and participate in group counseling sessions and activities.  Discharge plan in  progress.  Address health issues: Order Naproxen 250 mg bid for complaints of right knee pain.  Continue current treatment plan.   Medical Decision Making Problem Points:  Established problem, stable/improving (1) Data Points:  Review of medication regiment & side effects (2)  I certify that inpatient services furnished can reasonably be expected to improve the patient's condition.   Airanna Partin NP-C 02/02/2013, 2:18 PM

## 2013-02-03 NOTE — Progress Notes (Signed)
D: Pt is depressed in affect and mood. He is also expressing feelings of loneliness. He has a strong interest in receiving grief counseling upon discharge. He is also requesting a referral to a Therapist, sports and a Counselor in the [place] part of [place] (preferably close to [employer]). He is not interested in a rehab program at this time. He says this is impossible with his current responsibilities such as taking care of his mother.He also reports that his increased drinking occurred after his wife died 2 yrs ago. Therefore he sees grief counseling as a priority of focus.   Pt attended group this evening. Pt observed interacting appropriately within the milieu. Pt is denying any SI/HI.  A: Writer administered scheduled and prn medications to pt. Continued support and availability as needed was extended to this pt. Staff continue to monitor pt with q65min checks.  R: No adverse drug reactions noted. Pt receptive to treatment. Pt remains safe at this time.

## 2013-02-03 NOTE — [facility] Group Notes (Signed)
[facility] LCSW Group Therapy  02/03/2013  1:15 PM   Type of Therapy:  Group Therapy  Participation Level:  Active  Participation Quality:  Appropriate and Attentive  Affect:  Appropriate  Cognitive:  Alert and Appropriate  Insight:  Developing/Improving and Engaged  Engagement in Therapy:  Developing/Improving and Engaged  Modes of Intervention:  Clarification, Confrontation, Discussion, Education, Exploration, Limit-setting, Orientation, Problem-solving, Rapport Building, Dance movement psychotherapist, Socialization and Support  Summary of Progress/Problems: The topic for group therapy was feelings about diagnosis.  Pt actively participated in group discussion on their past and current diagnosis and how they feel towards this.  Pt also identified how society and family members judge them, based on their diagnosis as well as stereotypes and stigmas.   Pt shared that his diagnosis is "a drunk".  Pt states that he has been drinking alcohol daily for a 1.5 year and was thinking about his life and his losses in life.  Pt was able to process that looking back he could've talked to someone.  Pt states that he plans to talk to a therapist and have support when he leaves the hospital.  Pt actively participated and was engaged in group discussion.    Karalyn Kadel Horton, LCSWA 02/03/2013 2:00 PM

## 2013-02-03 NOTE — Progress Notes (Signed)
Recreation Therapy Notes  Date: 07.08.2014 Time: 2:45pm Location: [address] Hall Dayroom      Group Topic/Focus: Animal Assist Activities/Therapy (AAA/T)  Participation Level: Active  Participation Quality: Appropriate  Affect: Euthymic  Cognitive: Appropriate  Additional Comments:  07.08.2014 Session = AAA Session; Dog Team: [place] and handler  Patient interacted appropriately with dog team, LRT and peers.   Wyndi Northrup L Conan Mcmanaway, LRT/CTRS  Claiborne Stroble L 02/03/2013 3:49 PM 

## 2013-02-03 NOTE — [facility] Group Notes (Signed)
[facility] LCSW Aftercare Discharge Planning Group Note   02/03/2013  8:45 AM  Participation Quality:  Alert and Appropriate   Mood/Affect:  Appropriate, Flat and Depressed  Depression Rating:  2  Anxiety Rating:  2  Thoughts of Suicide:  Pt denies SI/HI  Will you contract for safety?   Yes  Current AVH:  No  Plan for Discharge/Comments:  Pt attended discharge planning group and actively participated in group.  CSW provided pt with today's workbook.  Pt reports improved sleep last night.  Pt states that he resides in [place] with mother and will return home there.  Pt states that he doesn't have a psychiatrist or therapist and will need a referral from CSW.  CSW to make appropriate referrals. No further needs voiced by pt at this time.    Transportation Means: Pt reports access to transportation  Supports: Pt names his family as supportive  Reyes Ivan, LCSWA 02/03/2013 10:35 AM

## 2013-02-03 NOTE — Progress Notes (Signed)
D:  Patient's self inventory sheet, patient sleeps well, good appetite, high energy level, good attention span.  Rated depression and hopelessness #2.  Denied withdrawals.  Denied SI.   Has experienced headache and blurred vision in past 24 hours.  Pain goal #1, worst pain #7.  After discharge, will talk to people.  No problems taking meds after discharge. A:  Medications administered per MD orders.  Emotional support and encouragement given patient. R:  Denied SI and HI.   Denied A/V hallucinations.   Denied pain.  Will continue to monitor patient for safety with 15 minute checks.  Safety maintained. \

## 2013-02-03 NOTE — Progress Notes (Signed)
Adult Psychoeducational Group Note  Date:  02/03/2013 Time:  1:27 PM  Group Topic/Focus:  Recovery Goals:   The focus of this group is to identify appropriate goals for recovery and establish a plan to achieve them.  Participation Level:  Active  Participation Quality:  Appropriate, Attentive and Sharing  Affect:  Appropriate  Cognitive:  Alert and Appropriate  Insight: Appropriate  Engagement in Group:  Engaged  Modes of Intervention:  Discussion  Additional Comments:  Pt was appropriate and sharing while attending group. Pt shared that not seeing his daughter for years and this is a barrier for his recovery.   Sharyn Lull 02/03/2013, 1:27 PM

## 2013-02-03 NOTE — Progress Notes (Signed)
D: Patient in the dayroom watching a movie on approach.  Patient states he had a good day.  Patient pleasant and cooperative.  Patient states he needs to talk more instead of hold things in.  Patient denies SI/HI and denies AVH.   A: Staff to monitor Q 15 mins for safety.  Encouragement and support offered.  Scheduled medications administered per orders. R: Patient remains safe on the unit.  Patient attended group tonight.  Patient cooperative and taking administered medications.

## 2013-02-03 NOTE — Progress Notes (Signed)
Patient ID: Samuel Reyes, male   DOB: February 22, 1956, 57 y.o.   MRN: [id number]  [facility] MD Progress Note  02/03/2013 1:51 PM Samuel Reyes  MRN:  [id number] Subjective:   Patient reports that he is sleeping good and that his mood is improving. He states "I have energy. I realize now that I need more support. I just can't sit in my room and watch TV all day expecting to feel good." Patient has a relative that can take him to appointments as he states "I will never get my license back. I had three DUI's and lots of charges for reckless driving like ninety two of them." Today the patient rates his depression at two and denies SI. He will have follow up at [facility] and would like to d/c tomorrow.   Diagnosis:   Axis I: Alcohol Abuse and Major Depression, Recurrent severe Axis II: Deferred Axis III:  Past Medical History  Diagnosis Date  . Cirrhosis   . Degenerative joint disease   . Hypertension   . Gastric ulcer   . Depression   . Asthma   . Mitral valve prolapse 2002  . H/O hiatal hernia   . Hiatal hernia 1982   Axis IV: economic problems, housing problems, occupational problems, problems related to legal system/crime, problems related to social environment and problems with primary support group Axis V: 41-50 serious symptoms  ADL's:  Intact  Sleep: Good  Appetite:  Good  Suicidal Ideation:  Denies Homicidal Ideation:  Denies AEB (as evidenced by):  Psychiatric Specialty Exam: Review of Systems  Constitutional: Negative.   HENT: Negative.   Eyes: Negative.   Respiratory: Negative.   Cardiovascular: Negative.   Gastrointestinal: Negative.   Genitourinary: Negative.   Musculoskeletal: Negative.   Skin: Negative.   Neurological: Negative.   Endo/Heme/Allergies: Negative.   Psychiatric/Behavioral: Positive for depression and substance abuse. Negative for suicidal ideas, hallucinations and memory loss. The patient is nervous/anxious. The patient does not have  insomnia.     Blood pressure 128/84, pulse 80, temperature 97.5 F (36.4 C), temperature source Oral, resp. rate 18, height 5\' 3"  (1.6 m), weight 63.504 kg (140 lb).Body mass index is 24.81 kg/(m^2).  General Appearance: Casual  Eye Contact::  Good  Speech:  Clear and Coherent  Volume:  Normal  Mood:  Anxious and Hopeless  Affect:  Flat  Thought Process:  Goal Directed and Intact  Orientation:  Full (Time, Place, and Person)  Thought Content:  WDL  Suicidal Thoughts:  Yes.  without intent/plan  Homicidal Thoughts:  No  Memory:  Immediate;   Good Recent;   Good Remote;   Good  Judgement:  Impaired  Insight:  Shallow  Psychomotor Activity:  Normal  Concentration:  Fair  Recall:  Fair  Akathisia:  No  Handed:  Right  AIMS (if indicated):     Assets:  Communication Skills Desire for Improvement Intimacy Leisure Time Social Support Talents/Skills  Sleep:  Number of Hours: 6.25   Current Medications: Current Facility-Administered Medications  Medication Dose Route Frequency Provider Last Rate Last Dose  . acetaminophen (TYLENOL) tablet 650 mg  650 mg Oral Q6H PRN Court Joy, PA-C   650 mg at 02/02/13 1436  . albuterol (PROVENTIL HFA;VENTOLIN HFA) 108 (90 BASE) MCG/ACT inhaler 2 puff  2 puff Inhalation Q6H PRN Court Joy, PA-C   2 puff at 02/03/13 1307  . alum & mag hydroxide-simeth (MAALOX/MYLANTA) 200-200-20 MG/5ML suspension 30 mL  30 mL Oral Q4H PRN  Court Joy, PA-C      . ARIPiprazole (ABILIFY) tablet 5 mg  5 mg Oral Daily Court Joy, PA-C   5 mg at 02/03/13 0810  . [START ON 02/04/2013] chlordiazePOXIDE (LIBRIUM) capsule 25 mg  25 mg Oral Daily Court Joy, PA-C      . feeding supplement (ENSURE COMPLETE) liquid 237 mL  237 mL Oral BID BM Earna Coder, RD   237 mL at 02/03/13 1310  . FLUoxetine (PROZAC) capsule 10 mg  10 mg Oral Daily Fransisca Kaufmann, NP   10 mg at 02/03/13 0811  . magnesium hydroxide (MILK OF MAGNESIA) suspension 30 mL  30 mL Oral  Daily PRN Court Joy, PA-C      . metoprolol tartrate (LOPRESSOR) tablet 25 mg  25 mg Oral BID Court Joy, PA-C   25 mg at 02/03/13 1610  . mirtazapine (REMERON) tablet 15 mg  15 mg Oral QHS Jorje Guild, PA-C   15 mg at 02/02/13 2125  . multivitamin with minerals tablet 1 tablet  1 tablet Oral Daily Court Joy, PA-C   1 tablet at 02/03/13 9604  . naproxen (NAPROSYN) tablet 250 mg  250 mg Oral BID WC Fransisca Kaufmann, NP   250 mg at 02/03/13 5409  . nicotine (NICODERM CQ - dosed in mg/24 hours) patch 21 mg  21 mg Transdermal Q0600 Nehemiah Settle, MD   21 mg at 02/03/13 0609  . pantoprazole (PROTONIX) EC tablet 20 mg  20 mg Oral BID Court Joy, PA-C   20 mg at 02/03/13 8119  . thiamine (B-1) injection 100 mg  100 mg Intramuscular Once Court Joy, PA-C      . thiamine (VITAMIN B-1) tablet 100 mg  100 mg Oral Daily Court Joy, PA-C   100 mg at 02/03/13 1478    Lab Results: No results found for this or any previous visit (from the past 48 hour(s)).  Physical Findings: AIMS: Facial and Oral Movements Muscles of Facial Expression: None, normal Lips and Perioral Area: None, normal Jaw: None, normal Tongue: None, normal,Extremity Movements Upper (arms, wrists, hands, fingers): None, normal Lower (legs, knees, ankles, toes): None, normal, Trunk Movements Neck, shoulders, hips: None, normal, Overall Severity Severity of abnormal movements (highest score from questions above): None, normal Incapacitation due to abnormal movements: None, normal Patient's awareness of abnormal movements (rate only patient's report): No Awareness, Dental Status Current problems with teeth and/or dentures?: No Does patient usually wear dentures?: No  CIWA:  CIWA-Ar Total: 1 COWS:  COWS Total Score: 0  Treatment Plan Summary: Daily contact with patient to assess and evaluate symptoms and progress in treatment Medication management  Plan: Continue crisis management and  stabilization.  Medication management: Reviewed with patient who states no untoward effects. Tolerating Prozac that was initiated yesterday.  Encouraged patient to attend groups and participate in group counseling sessions and activities.  Discharge plan in progress. Anticipate d/c tomorrow.  Address health issues: Vitals reviewed and stable.  Continue current treatment plan.   Medical Decision Making Problem Points:  Established problem, stable/improving (1) Data Points:  Review of medication regiment & side effects (2)  I certify that inpatient services furnished can reasonably be expected to improve the patient's condition.   Therman Hughlett NP-C 02/03/2013, 1:51 PM

## 2013-02-03 NOTE — BHH Group Notes (Signed)
Adult Psychoeducational Group Note  Date:  02/03/2013 Time:  10:46 PM  Group Topic/Focus:  Wrap-Up Group:   The focus of this group is to help patients review their daily goal of treatment and discuss progress on daily workbooks.  Participation Level:  Active  Participation Quality:  Appropriate  Affect:  Appropriate  Cognitive:  Appropriate  Insight: Appropriate  Engagement in Group:  Engaged  Modes of Intervention:  Discussion  Additional Comments:  Treylon stated he slept well last night and he's going home tomorrow.  He also stated he had a good day and his goal was to hang in there and try to get better.  Zakariye also mentioned that he would be doing aftercare tomorrow.  Caroll Rancher A 02/03/2013, 10:46 PM

## 2013-02-04 DIAGNOSIS — F339 Major depressive disorder, recurrent, unspecified: Secondary | ICD-10-CM

## 2013-02-04 MED ORDER — FLUOXETINE HCL 10 MG PO CAPS: 10.0000 mg | ORAL_CAPSULE | Freq: Every day | ORAL | Status: DC

## 2013-02-04 MED ORDER — MIRTAZAPINE 15 MG PO TABS: 15.0000 mg | ORAL_TABLET | Freq: Every day | ORAL | Status: DC

## 2013-02-04 MED ORDER — ARIPIPRAZOLE 5 MG PO TABS: 5.0000 mg | ORAL_TABLET | Freq: Every day | ORAL | Status: DC

## 2013-02-04 MED ORDER — ALBUTEROL SULFATE HFA 108 (90 BASE) MCG/ACT IN AERS: 2.0000 | INHALATION_SPRAY | Freq: Four times a day (QID) | RESPIRATORY_TRACT | Status: DC | PRN

## 2013-02-04 MED ORDER — METOPROLOL TARTRATE 25 MG PO TABS: 25.0000 mg | ORAL_TABLET | Freq: Two times a day (BID) | ORAL | Status: DC

## 2013-02-04 MED ORDER — NAPROXEN 250 MG PO TABS: 250.0000 mg | ORAL_TABLET | Freq: Two times a day (BID) | ORAL | Status: DC

## 2013-02-04 MED ORDER — PANTOPRAZOLE SODIUM 20 MG PO TBEC: 20.0000 mg | DELAYED_RELEASE_TABLET | Freq: Two times a day (BID) | ORAL | Status: DC

## 2013-02-04 NOTE — Progress Notes (Signed)
D:  Patient's self inventory sheet, patient sleeps well, good appetite, high energy level, improving attention span.  Rated depression and hopelessness #1.  Denied withdrawals.  Denied SI.  Has experienced headache and blurred vision in past 24 hours.   Pain goal #1, worst pain #6.  Does have discharge plans.  No problems taking meds after discharge. A:  Medications administered per MD orders.  Emotional support and encouragement given patient. R:  Denied SI and HI.  Denied A/V hallucinations.  Contracts or safety.   Will continue to monitor patient for safety with 15 minute checks.  Safety maintained. Patient stated he plans to make follow up appointment with his MD after discharge concerning his vision.  Plans to live with his mother after discharge.

## 2013-02-04 NOTE — Progress Notes (Signed)
[facility] Adult Case Management Discharge Plan :  Will you be returning to the same living situation after discharge: Yes,  Patient is returning to his home. At discharge, do you have transportation home?:Yes,  Patient to arrange transportation home Do you have the ability to pay for your medications:Yes,  Patient has Medicaid.  Release of information consent forms completed and in the chart;  Patient's signature needed at discharge.  Patient to Follow up at: Follow-up Information   Follow up with [facility] On 02/11/2013. (You will see Ledon Snare on Wednesday, February 11, 2013 at 3:00 PM)    Contact information:   [address]  [facility]  [phone]      Please follow up. (You can also contact [facility], [address] [phone], for grief and loss counseling.  It is a free service.)       Patient denies SI/HI:   Patient no longer endorsing SI/HI or other thoughts of self harm.     Safety Planning and Suicide Prevention discussed:.Reviewed with all patients during discharge planning group   Samuel Reyes, Samuel Reyes July 02/04/2013, 11:48 AM

## 2013-02-04 NOTE — Tx Team (Signed)
Interdisciplinary Treatment Plan Update   Date Reviewed:  02/04/2013  Time Reviewed:  9:51 AM  Progress in Treatment:   Attending groups: Yes Participating in groups: Yes Taking medication as prescribed: Yes  Tolerating medication: Yes Family/Significant other contact made: Yes, contact made with family. Patient understands diagnosis: Yes  Discussing patient identified problems/goals with staff: Yes Medical problems stabilized or resolved: Yes Denies suicidal/homicidal ideation: Yes Patient has not harmed self or others: Yes  For review of initial/current patient goals, please see plan of care.  Estimated Length of Stay:  Discharge home today.  Reasons for Continued Hospitalization:   New Problems/Goals identified:    Discharge Plan or Barriers:   Home with outpatient follow up [facility]  Additional Comments:  Attendees:  Patient:   Samuel Reyes 02/04/2013 9:51 AM   Signature:  02/04/2013 9:51 AM  Signature: 02/04/2013 9:51 AM  Signature:  02/04/2013 9:51 AM  Signature:Beverly Terrilee Croak, RN 02/04/2013 9:51 AM  Signature:  02/04/2013 9:51 AM  Signature:  Juline Patch, LCSW 02/04/2013 9:51 AM  Signature:  02/04/2013 9:51 AM  Signature:  Maseta Dorley,Care Coordinator 02/04/2013 9:51 AM  Signature: Fransisca Kaufmann, [facility] 02/04/2013 9:51 AM  Signature:    Signature:    Signature:      Scribe for Treatment Team:   Juline Patch,  02/04/2013 9:51 AM

## 2013-02-04 NOTE — Progress Notes (Signed)
Patient discharged per physician order; patient denies si/hi and a/v hallucinations; patient received samples, copy of AVS,  and prescriptions after it was reviewed; patient had no other questions or concerns at this time; patient left the unit ambulatory; patient signed and verbalized that he received all his belongings

## 2013-02-04 NOTE — [facility] Suicide Risk Assessment (Signed)
Suicide Risk Assessment  Discharge Assessment     Demographic Factors:  Male and Caucasian  Mental Status Per Nursing Assessment::   On Admission:  NA  Current Mental Status by Physician: In full contact with reality. There are no suicidal ideas, plans or intent. States he feels better. He shares about all the losses he has had trough the years. He is planning to pursue outpatient treatment, go to Hospice to pursue more work on his grief. He is planning to abstain from drinking as he is aware of the effect on his mood   Loss Factors: Loss of significant relationship and Decline in physical health  Historical Factors: NA  Risk Reduction Factors:   Sense of responsibility to family and Positive social support  Continued Clinical Symptoms:  Depression:   Comorbid alcohol abuse/dependence Alcohol/Substance Abuse/Dependencies  Cognitive Features That Contribute To Risk:  Polarized thinking Thought constriction (tunnel vision)    Suicide Risk:  Minimal: No identifiable suicidal ideation.  Patients presenting with no risk factors but with morbid ruminations; may be classified as minimal risk based on the severity of the depressive symptoms  Discharge Diagnoses:   AXIS I:  Major Depression recurrent, Alcohol Abuse AXIS II:  Deferred AXIS III:   Past Medical History  Diagnosis Date  . Cirrhosis   . Degenerative joint disease   . Hypertension   . Gastric ulcer   . Depression   . Asthma   . Mitral valve prolapse 2002  . H/O hiatal hernia   . Hiatal hernia 1982   AXIS IV:  other psychosocial or environmental problems AXIS V:  61-70 mild symptoms  Plan Of Care/Follow-up recommendations:  Activity:  as tolerated Diet:  regular Follow up outpatient basis Is patient on multiple antipsychotic therapies at discharge:  No   Has Patient had three or more failed trials of antipsychotic monotherapy by history:  No  Recommended Plan for Multiple Antipsychotic Therapies:  N/A   Anshu Wehner A 02/04/2013, 1:02 PM
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
## 2013-02-04 NOTE — Plan of Care (Signed)
Problem: Alteration in mood Goal: LTG-Pt's behavior demonstrates decreased signs of depression Goal not met: Pt presents with flat affect and depressed mood. Pt admitted with depression rating of 10. Pt to show decreased sign of depression and a rating of 3 or less before d/c. Samuel Samuel, LCSWA 02/02/2013 12:59 PM   (Patient's behavior demonstrates decreased signs of depression to the point the patient is safe to return home and continue treatment in an outpatient setting)  Outcome: Completed/Met Date Met:  02/04/13 Patient is rating depression at two.  He has attended groups and easily engaged in discussions.  Outpatient follow up is scheduled with [facility]. Samuel Pultz, LCSW 02/04/2013

## 2013-02-04 NOTE — Progress Notes (Signed)
Adult Psychoeducational Group Note  Date:  02/04/2013 Time:  1:31 PM  Group Topic/Focus:  Crisis Planning:   The purpose of this group is to help patients create a crisis plan for use upon discharge or in the future, as needed.  Participation Level:  Active  Participation Quality:  Appropriate, Attentive and Sharing  Affect:  Appropriate  Cognitive:  Alert and Appropriate  Insight: Appropriate  Engagement in Group:  Engaged  Modes of Intervention:  Discussion  Additional Comments:  Pt was appropriate and sharing while attending group. Pt shared that losing his family and remaining isolated is a trigger for him. He plans to exercise and participate in community activities once discharged to help with triggers.    Sharyn Lull 02/04/2013, 1:31 PM

## 2013-02-04 NOTE — Progress Notes (Signed)
Adult Psychoeducational Group Note  Date:  02/04/2013 Time:  11:12 PM  Group Topic/Focus:  Goals Group:   The focus of this group is to help patients establish daily goals to achieve during treatment and discuss how the patient can incorporate goal setting into their daily lives to aide in recovery.  Participation Level:  Active  Participation Quality:  Appropriate  Affect:  Appropriate  Cognitive:  Appropriate  Insight: Appropriate  Engagement in Group:  Engaged  Modes of Intervention:  Discussion  Additional Comments:  Pt stated that he is wanting to get back to a good place with his family  Aldona Lento 02/04/2013, 11:12 PM

## 2013-02-04 NOTE — Plan of Care (Signed)
Problem: Ineffective individual coping Goal: STG: Patient will participate in after care plan Goal not met: CSW assessing for appropriate referrals for pt and will have follow up secured prior to d/c. Reyes Ivan, LCSWA 02/02/2013  12:59 PM  Outcome: Completed/Met Date Met:  02/04/13 Patient has attended groups and actively engaged in discussions.  Outpatient follow up is scheduled with [facility]. Ohm Dentler, LCSW 02/04/2013

## 2013-02-06 NOTE — Progress Notes (Signed)
Patient Discharge Instructions:  After Visit Summary (AVS):   Faxed to:  02/06/13 Discharge Summary Note:   Faxed to:  02/06/13 Psychiatric Admission Assessment Note:   Faxed to:  02/06/13 Suicide Risk Assessment - Discharge Assessment:   Faxed to:  02/06/13 Faxed/Sent to the Next Level Care provider:  02/06/13 Faxed to [facility] @ [phone]  Jerelene Redden, 02/06/2013, 4:08 PM

## 2013-02-17 ENCOUNTER — Encounter: Payer: Self-pay | Admitting: Family Medicine

## 2013-02-17 ENCOUNTER — Ambulatory Visit: Payer: Medicaid Other | Attending: Family Medicine | Admitting: Family Medicine

## 2013-02-17 VITALS — BP 163/104 | HR 79 | Temp 99.1°F | Resp 16 | Wt 153.6 lb

## 2013-02-17 DIAGNOSIS — F17200 Nicotine dependence, unspecified, uncomplicated: Secondary | ICD-10-CM | POA: Insufficient documentation

## 2013-02-17 DIAGNOSIS — F329 Major depressive disorder, single episode, unspecified: Secondary | ICD-10-CM

## 2013-02-17 DIAGNOSIS — K449 Diaphragmatic hernia without obstruction or gangrene: Secondary | ICD-10-CM

## 2013-02-17 DIAGNOSIS — K219 Gastro-esophageal reflux disease without esophagitis: Secondary | ICD-10-CM | POA: Insufficient documentation

## 2013-02-17 DIAGNOSIS — I10 Essential (primary) hypertension: Secondary | ICD-10-CM

## 2013-02-17 DIAGNOSIS — J449 Chronic obstructive pulmonary disease, unspecified: Secondary | ICD-10-CM | POA: Insufficient documentation

## 2013-02-17 DIAGNOSIS — Z801 Family history of malignant neoplasm of trachea, bronchus and lung: Secondary | ICD-10-CM

## 2013-02-17 DIAGNOSIS — J4489 Other specified chronic obstructive pulmonary disease: Secondary | ICD-10-CM | POA: Insufficient documentation

## 2013-02-17 MED ORDER — IPRATROPIUM-ALBUTEROL 18-103 MCG/ACT IN AERO: 2.0000 | INHALATION_SPRAY | Freq: Four times a day (QID) | RESPIRATORY_TRACT | Status: DC

## 2013-02-17 MED ORDER — HYDROCHLOROTHIAZIDE 12.5 MG PO TABS: 12.5000 mg | ORAL_TABLET | Freq: Every day | ORAL | Status: DC

## 2013-02-17 NOTE — Progress Notes (Signed)
Patient here to establish care Has history of HTN 

## 2013-02-17 NOTE — Patient Instructions (Signed)
Depression, Adult Depression refers to feeling sad, low, down in the dumps, blue, gloomy, or empty. In general, there are two kinds of depression: 1. Depression that we all experience from time to time because of upsetting life experiences, including the loss of a job or the ending of a relationship (normal sadness or normal grief). This kind of depression is considered normal, is short lived, and resolves within a few days to 2 weeks. (Depression experienced after the loss of a loved one is called bereavement. Bereavement often lasts longer than 2 weeks but normally gets better with time.) 2. Clinical depression, which lasts longer than normal sadness or normal grief or interferes with your ability to function at home, at work, and in school. It also interferes with your personal relationships. It affects almost every aspect of your life. Clinical depression is an illness. Symptoms of depression also can be caused by conditions other than normal sadness and grief or clinical depression. Examples of these conditions are listed as follows:  Physical illness Some physical illnesses, including underactive thyroid gland (hypothyroidism), severe anemia, specific types of cancer, diabetes, uncontrolled seizures, heart and lung problems, strokes, and chronic pain are commonly associated with symptoms of depression.  Side effects of some prescription medicine In some people, certain types of prescription medicine can cause symptoms of depression.  Substance abuse Abuse of alcohol and illicit drugs can cause symptoms of depression. SYMPTOMS Symptoms of normal sadness and normal grief include the following:  Feeling sad or crying for short periods of time.  Not caring about anything (apathy).  Difficulty sleeping or sleeping too much.  No longer able to enjoy the things you used to enjoy.  Desire to be by oneself all the time (social isolation).  Lack of energy or motivation.  Difficulty  concentrating or remembering.  Change in appetite or weight.  Restlessness or agitation. Symptoms of clinical depression include the same symptoms of normal sadness or normal grief and also the following symptoms:  Feeling sad or crying all the time.  Feelings of guilt or worthlessness.  Feelings of hopelessness or helplessness.  Thoughts of suicide or the desire to harm yourself (suicidal ideation).  Loss of touch with reality (psychotic symptoms). Seeing or hearing things that are not real (hallucinations) or having false beliefs about your life or the people around you (delusions and paranoia). DIAGNOSIS  The diagnosis of clinical depression usually is based on the severity and duration of the symptoms. Your caregiver also will ask you questions about your medical history and substance use to find out if physical illness, use of prescription medicine, or substance abuse is causing your depression. Your caregiver also may order blood tests. TREATMENT  Typically, normal sadness and normal grief do not require treatment. However, sometimes antidepressant medicine is prescribed for bereavement to ease the depressive symptoms until they resolve. The treatment for clinical depression depends on the severity of your symptoms but typically includes antidepressant medicine, counseling with a mental health professional, or a combination of both. Your caregiver will help to determine what treatment is best for you. Depression caused by physical illness usually goes away with appropriate medical treatment of the illness. If prescription medicine is causing depression, talk with your caregiver about stopping the medicine, decreasing the dose, or substituting another medicine. Depression caused by abuse of alcohol or illicit drugs abuse goes away with abstinence from these substances. Some adults need professional help in order to stop drinking or using drugs. SEEK IMMEDIATE CARE IF:  You have   thoughts  about hurting yourself or others.  You lose touch with reality (have psychotic symptoms).  You are taking medicine for depression and have a serious side effect. FOR MORE INFORMATION National Alliance on Mental Illness: www.nami.[employer]: http://www.maynard.net/ Document Released: 07/13/2000 Document Revised: 01/15/2012 Document Reviewed: 10/15/2011 [facility] Patient Information 2014 [place], [place]. DASH Diet The DASH diet stands for "Dietary Approaches to Stop Hypertension." It is a healthy eating plan that has been shown to reduce high blood pressure (hypertension) in as little as 14 days, while also possibly providing other significant health benefits. These other health benefits include reducing the risk of breast cancer after menopause and reducing the risk of type 2 diabetes, heart disease, colon cancer, and stroke. Health benefits also include weight loss and slowing kidney failure in patients with chronic kidney disease.  DIET GUIDELINES  Limit salt (sodium). Your diet should contain less than 1500 mg of sodium daily.  Limit refined or processed carbohydrates. Your diet should include mostly whole grains. Desserts and added sugars should be used sparingly.  Include small amounts of heart-healthy fats. These types of fats include nuts, oils, and tub margarine. Limit saturated and trans fats. These fats have been shown to be harmful in the body. CHOOSING FOODS  The following food groups are based on a 2000 calorie diet. See your Registered Dietitian for individual calorie needs. Grains and Grain Products (6 to 8 servings daily)  Eat More Often: Whole-wheat bread, brown rice, whole-grain or wheat pasta, quinoa, popcorn without added fat or salt (air popped).  Eat Less Often: White bread, white pasta, white rice, cornbread. Vegetables (4 to 5 servings daily)  Eat More Often: Fresh, frozen, and canned vegetables. Vegetables may be raw, steamed, roasted, or  grilled with a minimal amount of fat.  Eat Less Often/Avoid: Creamed or fried vegetables. Vegetables in a cheese sauce. Fruit (4 to 5 servings daily)  Eat More Often: All fresh, canned (in natural juice), or frozen fruits. Dried fruits without added sugar. One hundred percent fruit juice ( cup [237 mL] daily).  Eat Less Often: Dried fruits with added sugar. Canned fruit in light or heavy syrup. [employer], Fish, and Poultry (2 servings or less daily. One serving is 3 to 4 oz [85-114 g]).  Eat More Often: Ninety percent or leaner ground beef, tenderloin, sirloin. Round cuts of beef, chicken breast, [place] breast. All fish. Grill, bake, or broil your meat. Nothing should be fried.  Eat Less Often/Avoid: Fatty cuts of meat, [place], or chicken leg, thigh, or wing. Fried cuts of meat or fish. Dairy (2 to 3 servings)  Eat More Often: Low-fat or fat-free milk, low-fat plain or light yogurt, reduced-fat or part-skim cheese.  Eat Less Often/Avoid: Milk (whole, 2%).Whole milk yogurt. Full-fat cheeses. Nuts, Seeds, and Legumes (4 to 5 servings per week)  Eat More Often: All without added salt.  Eat Less Often/Avoid: Salted nuts and seeds, canned beans with added salt. Fats and Sweets (limited)  Eat More Often: Vegetable oils, tub margarines without trans fats, sugar-free gelatin. Mayonnaise and salad dressings.  Eat Less Often/Avoid: Coconut oils, palm oils, butter, stick margarine, cream, half and half, cookies, candy, pie. FOR MORE INFORMATION The Dash Diet Eating Plan: www.dashdiet.org Document Released: 07/05/2011 Document Revised: 10/08/2011 Document Reviewed: 07/05/2011 [facility] Patient Information 2014 [place], [place]. Hypertension As your heart beats, it forces blood through your arteries. This force is your blood pressure. If the pressure is too high, it is called hypertension (HTN) or  high blood pressure. HTN is dangerous because you may have it and not know it. High blood pressure  may mean that your heart has to work harder to pump blood. Your arteries may be narrow or stiff. The extra work puts you at risk for heart disease, stroke, and other problems.  Blood pressure consists of two numbers, a higher number over a lower, 110/72, for example. It is stated as "110 over 72." The ideal is below 120 for the top number (systolic) and under 80 for the bottom (diastolic). Write down your blood pressure today. You should pay close attention to your blood pressure if you have certain conditions such as:  Heart failure.  Prior heart attack.  Diabetes  Chronic kidney disease.  Prior stroke.  Multiple risk factors for heart disease. To see if you have HTN, your blood pressure should be measured while you are seated with your arm held at the level of the heart. It should be measured at least twice. A one-time elevated blood pressure reading (especially in the Emergency Department) does not mean that you need treatment. There may be conditions in which the blood pressure is different between your right and left arms. It is important to see your caregiver soon for a recheck. Most people have essential hypertension which means that there is not a specific cause. This type of high blood pressure may be lowered by changing lifestyle factors such as:  Stress.  Smoking.  Lack of exercise.  Excessive weight.  Drug/tobacco/alcohol use.  Eating less salt. Most people do not have symptoms from high blood pressure until it has caused damage to the body. Effective treatment can often prevent, delay or reduce that damage. TREATMENT  When a cause has been identified, treatment for high blood pressure is directed at the cause. There are a large number of medications to treat HTN. These fall into several categories, and your caregiver will help you select the medicines that are best for you. Medications may have side effects. You should review side effects with your caregiver. If your  blood pressure stays high after you have made lifestyle changes or started on medicines,   Your medication(s) may need to be changed.  Other problems may need to be addressed.  Be certain you understand your prescriptions, and know how and when to take your medicine.  Be sure to follow up with your caregiver within the time frame advised (usually within two weeks) to have your blood pressure rechecked and to review your medications.  If you are taking more than one medicine to lower your blood pressure, make sure you know how and at what times they should be taken. Taking two medicines at the same time can result in blood pressure that is too low. SEEK IMMEDIATE MEDICAL CARE IF:  You develop a severe headache, blurred or changing vision, or confusion.  You have unusual weakness or numbness, or a faint feeling.  You have severe chest or abdominal pain, vomiting, or breathing problems. MAKE SURE YOU:   Understand these instructions.  Will watch your condition.  Will get help right away if you are not doing well or get worse. Document Released: 07/16/2005 Document Revised: 10/08/2011 Document Reviewed: 03/05/2008 [facility] [place] Patient Information 2014 [place], [place]. Chronic Obstructive Pulmonary Disease Chronic obstructive pulmonary disease (COPD) is a condition in which airflow from the lungs is restricted. The lungs can never return to normal, but there are measures you can take which will improve them and make you feel better. CAUSES  Smoking.  Exposure to secondhand smoke.  Breathing in irritants such as air pollution, dust, cigarette smoke, strong odors, aerosol sprays, or paint fumes.  History of lung infections. SYMPTOMS   Deep, persistent (chronic) cough with a large amount of thick mucus.  Wheezing.  Shortness of breath, especially with physical activity.  Feeling like you cannot get enough air.  Difficulty breathing.  Rapid breaths (tachypnea).  Gray or bluish  discoloration (cyanosis) of the skin, especially in fingers, toes, or lips.  Fatigue.  Weight loss.  Swelling in legs, ankles, or feet.  Fast heartbeat (tachycardia).  Frequent lung infections.   Chest tightness. DIAGNOSIS  Initial diagnosis may be based on your history, symptoms, and physical examination. Additional tests for COPD may include:  Chest X-ray.  Computed tomography (CT) scan.  Lung (pulmonary) function tests.  Blood tests. TREATMENT  Treatment focuses on making you comfortable (supportive care). Your caregiver may prescribe medicines (inhaled or pills) to help improve your breathing. Additional treatment options may include oxygen therapy and pulmonary rehabilitation. Treatment should also include reducing your exposure to known irritants and following a plan to stop smoking. HOME CARE INSTRUCTIONS   Take all medicines, including antibiotic medicines, as directed by your caregiver.  Use inhaled medicines as directed by your caregiver.  Avoid medicines or cough syrups that dry up your airway (antihistamines) and slow down the elimination of secretions. This decreases respiratory capacity and may lead to infections.  If you smoke, stop smoking.  Avoid exposure to smoke, chemicals, and fumes that aggravate your breathing.  Avoid contact with individuals that have a contagious illness.  Avoid extreme temperature and humidity changes.  Use humidifiers at home and at your bedside if they do not make breathing difficult.  Drink enough water and fluids to keep your urine clear or pale yellow. This loosens secretions.  Eat healthy foods. Eating smaller, more frequent meals and resting before meals may help you maintain your strength.  Ask your caregiver about the use of vitamins and mineral supplements.  Stay active. Exercise and physical activity will help maintain your ability to do things you want to do.  Balance activity with periods of rest.  Assume a  position of comfort if you become short of breath.  Learn and use relaxation techniques.  Learn and use controlled breathing techniques as directed by your caregiver. Controlled breathing techniques include:  Pursed lip breathing. This breathing technique starts with breathing in (inhaling) through your nose for 1 second. Next, purse your lips as if you were going to whistle. Then breathe out (exhale) through the pursed lips for 2 seconds.  Diaphragmatic breathing. Start by putting one hand on your abdomen just above your waist. Inhale slowly through your nose. The hand on your abdomen should move out. Then exhale slowly through pursed lips. You should be able to feel the hand on your abdomen moving in as you exhale.  Learn and use controlled coughing to clear mucus from your lungs. Controlled coughing is a series of short, progressive coughs. The steps of controlled coughing are: 1. Lean your head slightly forward. 2. Breathe in deeply using diaphragmatic breathing. 3. Try to hold your breath for 3 seconds. 4. Keep your mouth slightly open while coughing twice. 5. Spit any mucus out into a tissue. 6. Rest and repeat the steps once or twice as needed.  Receive all protective vaccines your caregiver suggests, especially pneumococcal and influenza vaccines.  Learn to manage stress.  Schedule and attend all follow-up appointments as  directed by your caregiver. It is important to keep all your appointments.  Participate in pulmonary rehabilitation as directed by your caregiver.  Use home oxygen as suggested. SEEK MEDICAL CARE IF:   You are coughing up more mucus than usual.  There is a change in the color or thickness of the mucus.  Breathing is more labored than usual.  Your breathing is faster than usual.  Your skin color is more cyanotic than usual.  You are running out of the medicine you take for your breathing.  You are anxious, apprehensive, or restless.  You have a  fever. SEEK IMMEDIATE MEDICAL CARE IF:   You have a rapid heart rate.  You have shortness of breath while you are resting.  You have shortness of breath that prevents you from being able to talk.  You have shortness of breath that prevents you from performing your usual physical activities.  You have chest pain lasting longer than 5 minutes.  You have a seizure.  Your family or friends notice that you are agitated or confused. MAKE SURE YOU:   Understand these instructions.  Will watch your condition.  Will get help right away if you are not doing well or get worse. Document Released: 04/25/2005 Document Revised: 04/09/2012 Document Reviewed: 09/15/2010 [facility] Patient Information 2014 [place], [place].

## 2013-02-17 NOTE — Progress Notes (Signed)
Patient ID: Samuel Reyes, male   DOB: [birth year], 57 y.o.   MRN: [id number]  CC: establish care   HPI: Pt is reporting that he has COPD and using his albuterol inhaler at least 4 times per day.  Pt is smoking but is trying to cut back.  He is not able to quit at this time.  Pt just relocated to this area from [place].  Pt is being treated for severe depression by psychiatry and reporting that he is feeling much better now.  He is reporting that he recently lost his son and his wife.    Allergies  Allergen Reactions  . Bee Venom Anaphylaxis  . Penicillins Rash   Past Medical History  Diagnosis Date  . Cirrhosis   . Degenerative joint disease   . Hypertension   . Gastric ulcer   . Depression   . Asthma   . Mitral valve prolapse 2002  . H/O hiatal hernia   . Hiatal hernia 1982   Current Outpatient Prescriptions on File Prior to Visit  Medication Sig Dispense Refill  . albuterol (PROVENTIL HFA;VENTOLIN HFA) 108 (90 BASE) MCG/ACT inhaler Inhale 2 puffs into the lungs every 6 (six) hours as needed for wheezing.      . ARIPiprazole (ABILIFY) 5 MG tablet Take 1 tablet (5 mg total) by mouth daily. For mood stability.  30 tablet  0  . FLUoxetine (PROZAC) 10 MG capsule Take 1 capsule (10 mg total) by mouth daily. For depression.  30 capsule  0  . metoprolol tartrate (LOPRESSOR) 25 MG tablet Take 1 tablet (25 mg total) by mouth 2 (two) times daily.      . mirtazapine (REMERON) 15 MG tablet Take 1 tablet (15 mg total) by mouth at bedtime. For depression and sleep.  30 tablet  0  . naproxen (NAPROSYN) 250 MG tablet Take 1 tablet (250 mg total) by mouth 2 (two) times daily with a meal. For knee pain  60 tablet  0  . pantoprazole (PROTONIX) 20 MG tablet Take 1 tablet (20 mg total) by mouth 2 (two) times daily.       No current facility-administered medications on file prior to visit.   History reviewed. No pertinent family history. History   Social History  . Marital Status: Single    Spouse  Name: N/A    Number of Children: N/A  . Years of Education: N/A   Occupational History  . Disability    Social History Main Topics  . Smoking status: Current Every Day Smoker -- 1.00 packs/day for 45 years  . Smokeless tobacco: Not on file  . Alcohol Use: 25.2 oz/week    42 Cans of beer per week  . Drug Use: No  . Sexually Active: Not Currently   Other Topics Concern  . Not on file   Social History Narrative  . No narrative on file    Review of Systems  Constitutional: Negative for fever, chills, diaphoresis, activity change, appetite change and fatigue.  HENT: Negative for ear pain, nosebleeds, congestion, facial swelling, rhinorrhea, neck pain, neck stiffness and ear discharge.   Eyes: Negative for pain, discharge, redness, itching and visual disturbance.  Respiratory: Negative for cough, choking, chest tightness, shortness of breath, wheezing and stridor.   Cardiovascular: Negative for chest pain, palpitations and leg swelling.  Gastrointestinal: Negative for abdominal distention.  Genitourinary: Negative for dysuria, urgency, frequency, hematuria, flank pain, decreased urine volume, difficulty urinating and dyspareunia.  Musculoskeletal: Negative for back pain, joint swelling, arthralgias  and gait problem.  Neurological: Negative for dizziness, tremors, seizures, syncope, facial asymmetry, speech difficulty, weakness, light-headedness, numbness and headaches.  Hematological: Negative for adenopathy. Does not bruise/bleed easily.  Psychiatric/Behavioral: Negative for hallucinations, behavioral problems, confusion, dysphoric mood, decreased concentration and agitation.    Objective:   Filed Vitals:   02/17/13 1505  BP: 163/104  Pulse: 79  Temp: 99.1 F (37.3 C)  Resp: 16    Physical Exam  Constitutional: Appears well-developed and well-nourished. No distress.  HENT: Normocephalic. External right and left ear normal. Oropharynx is clear and moist.  Eyes:  Conjunctivae and EOM are normal. PERRLA, no scleral icterus.  Neck: Normal ROM. Neck supple. No JVD. No tracheal deviation. No thyromegaly.  CVS: RRR, S1/S2 +, no murmurs, no gallops, no carotid bruit.  Pulmonary: tight BS bilateral, no stridor, rhonchi, wheezes, rales.  Abdominal: Soft. BS +,  no distension, tenderness, rebound or guarding.  Musculoskeletal: Normal range of motion. No edema and no tenderness.  Lymphadenopathy: No lymphadenopathy noted, cervical, inguinal. Neuro: Alert. Normal reflexes, muscle tone coordination. No cranial nerve deficit. Skin: Skin is warm and dry. No rash noted. Not diaphoretic. No erythema. No pallor.  Psychiatric: Normal mood and affect. Behavior, judgment, thought content normal.   Lab Results  Component Value Date   WBC 8.7 01/29/2013   HGB 15.1 01/29/2013   HCT 43.9 01/29/2013   MCV 86.2 01/29/2013   PLT 356 01/29/2013   Lab Results  Component Value Date   CREATININE 0.82 01/29/2013   BUN 8 01/29/2013   NA 141 01/29/2013   K 3.8 01/29/2013   CL 103 01/29/2013   CO2 23 01/29/2013    No results found for this basename: HGBA1C   Lipid Panel  No results found for this basename: chol, trig, hdl, cholhdl, vldl, ldlcalc       Assessment and plan:   Patient Active Problem List   Diagnosis Date Noted  . Unspecified essential hypertension 02/17/2013  . FH: lung cancer 02/17/2013  . COPD (chronic obstructive pulmonary disease) 02/17/2013  . Smoker 02/17/2013  . Bereavement 02/02/2013  . Depression 02/02/2013   The patient was counseled on the dangers of tobacco use, and was advised to quit.  Reviewed strategies to maximize success, including removing cigarettes and smoking materials from environment.  Pt will try combivent BID for treatment of his COPD and as above strongly encouraged cessation of tobacco.   Add HCTZ 12.5 mg po daily to BP regimen.   Pt encouraged to follow up with mental health for severe depression  Follow up BP check in 2  weeks  Follow up OV in 3 months  The patient was given clear instructions to go to ER or return to medical center if symptoms don't improve, worsen or new problems develop.  The patient verbalized understanding.  The patient was told to call to get any lab results if not heard anything in the next week.    Rodney Langton, MD, CDE, FAAFP [facility] [place], [place]

## 2013-02-18 LAB — COMPLETE METABOLIC PANEL WITH GFR
CO2: 28 mEq/L (ref 19–32)
Creat: 0.87 mg/dL (ref 0.50–1.35)
GFR, Est African American: >89 mL/min
GFR, Est Non African American: >89 mL/min
Glucose, Bld: 94 mg/dL (ref 70–99)
Total Bilirubin: 0.6 mg/dL (ref 0.3–1.2)
Total Protein: 7.8 g/dL (ref 6.0–8.3)

## 2013-02-18 LAB — LIPID PANEL
HDL: 79 mg/dL (ref >39)
Triglycerides: 144 mg/dL (ref <150)

## 2013-02-18 LAB — CBC
HCT: 45.5 % (ref 39.0–52.0)
Hemoglobin: 15.4 g/dL (ref 13.0–17.0)
MCH: 28.7 pg (ref 26.0–34.0)
MCV: 84.9 fL (ref 78.0–100.0)
RBC: 5.36 MIL/uL (ref 4.22–5.81)

## 2013-02-18 NOTE — Progress Notes (Signed)
Quick Note:  Please inform patient that labs came back okay except that his cholesterol levels were elevated. Recommend he go on a low fat low cholesterol diet and start exercising 5 times per week. Also, his platelet count was mildly elevated. I'm recommending that he have his labs repeated in 3 months.  Rodney Langton, MD, CDE, FAAFP [facility] [place], [place]   ______

## 2013-02-19 ENCOUNTER — Telehealth: Payer: Self-pay | Admitting: *Deleted

## 2013-02-19 NOTE — Telephone Encounter (Signed)
02/19/13 Patient made aware lab results cholesterol level elevated recommend low fat diet And low cholesterol diet.please execise 5 time per week.. Repeat lab in 3 months patient stated  He will call back and schedule appointment. P.Toneisha Savary,RN BSN [facility]

## 2013-03-09 ENCOUNTER — Ambulatory Visit (INDEPENDENT_AMBULATORY_CARE_PROVIDER_SITE_OTHER): Payer: Medicaid Other | Admitting: Pharmacist

## 2013-03-09 ENCOUNTER — Encounter: Payer: Self-pay | Admitting: Pharmacist

## 2013-03-09 VITALS — BP 117/71 | HR 69 | Ht 64.0 in | Wt 143.0 lb

## 2013-03-09 DIAGNOSIS — F17200 Nicotine dependence, unspecified, uncomplicated: Secondary | ICD-10-CM

## 2013-03-09 DIAGNOSIS — J449 Chronic obstructive pulmonary disease, unspecified: Secondary | ICD-10-CM

## 2013-03-09 NOTE — Progress Notes (Signed)
S:    Patient arrives in good spirits- walking without assistance.   She presents to the clinic for lung function evaluation.  Patient reports breathing has been difficult for 4 months however it has been improved with the use of combivent. .   Age when started using tobacco on a daily basis 11. Number of Cigarettes per day 20 (max 40 until 4 years ago). Estimated Nicotine intake per day >20mg .   Smokes first cigarette <5 minutes after waking with coffee. Previously Smoked in the middle of the night.    Estimated Fagerstrom Score >5/10.  Most recent quit attempt - Has quit with patch while in the hospital.  What Medications (NRT, bupropion, varenicline) used in past includes PATCHES (interested in trying them again)  Rates CONFIDENCE of quitting tobacco on 1-10 scale of 7. Triggers to use tobacco include; First AM and after meals.      O: mMRC score= <2  See "scanned report" or Documentation Flowsheet (discrete results - PFTs) for  Spirometry results. Patient provided good effort while attempting spirometry.   Lung Age = 67  A/P: Spirometry evaluation reveals mild obstructive lung disease corresponding to GOLD Classification A based on spirometry and symptoms/hx.  Patient has been experiencing SOB for 3-4 months and taking albuterol with modest relief.   He was recently switched to Combivent and notes he has been doing well with this medication.  He is using only minimal albuterol for rescue.   History of Severe Nicotine Dependence of 35 years duration who has cut down from 2ppd to 1ppd over the last 4 years.   He is an  good candidate for success b/c of current level of motivation.  Interested in initiating nicotine replacement tx - will consider calling his provider for patches.  Written information provided. Provided encouragement to use the 1 800-QUIT NOW support program.  F/U  With Dr. Laural Benes.  Total time in face-to-face counseling 30 minutes.  Patient seen with Georga Kaufmann, PharmD  Resident

## 2013-03-09 NOTE — Assessment & Plan Note (Addendum)
Spirometry evaluation reveals mild obstructive lung disease corresponding to GOLD Classification A based on spirometry and symptoms/hx.  Patient has been experiencing SOB for 3-4 months and taking albuterol with modest relief.   He was recently switched to Combivent and notes he has been doing well with this medication.  He is using only minimal albuterol for rescue.  treatment plan at this time.    History of Severe Nicotine Dependence of 35 years duration who has cut down from 2ppd to 1ppd over the last 4 years.   He is an  good candidate for success b/c of current level of motivation.  Interested in initiating nicotine replacement tx - will consider calling his provider for patches.  Written information provided. Provided encouragement to use the 1 800-QUIT NOW support program.  F/U  With Dr. Laural Benes.  Total time in face-to-face counseling 30 minutes.  Patient seen with Georga Kaufmann, PharmD Resident

## 2013-03-09 NOTE — Assessment & Plan Note (Signed)
History of Severe Nicotine Dependence of 35 years duration who has cut down from 2ppd to 1ppd over the last 4 years.   He is an  good candidate for success b/c of current level of motivation.  Interested in initiating nicotine replacement tx - will consider calling his provider for patches.  Written information provided. Provided encouragement to use the 1 800-QUIT NOW support program.  F/U  With Dr. Laural Benes.  Total time in face-to-face counseling 30 minutes.  Patient seen with Georga Kaufmann, PharmD Resident

## 2013-03-09 NOTE — Patient Instructions (Addendum)
Lung function test showed MILD Obstruction.   Best of luck with quitting smoking.   Next visit with Dr. Laural Benes.   Thanks for coming in today!

## 2013-03-10 NOTE — Progress Notes (Signed)
Patient ID: Samuel Reyes, male   DOB: [birth year], 57 y.o.   MRN: [id number] Reviewed: Agree with Dr. Macky Lower documentation and management.

## 2013-03-12 ENCOUNTER — Telehealth: Payer: Self-pay | Admitting: *Deleted

## 2013-03-12 NOTE — Progress Notes (Signed)
Quick Note:  Please have patient come in to clinic to discuss his spirometry results which reveal mild obstruction but his lung age was 75 years. He definitely should stop smoking now.   Rodney Langton, MD, CDE, FAAFP [facility] [place], [place]   ______

## 2013-03-12 NOTE — Telephone Encounter (Signed)
03/12/13 Patient made aware of reultsPlease have patient come in to clinic to discuss his spirometry results which reveal mild obstruction but his lung age was 26 years. He definitely should stop smoking now Appointment scheduled for 03/23/13 P.[facility] BSN [facility]

## 2013-03-23 ENCOUNTER — Ambulatory Visit: Payer: Self-pay

## 2013-03-23 ENCOUNTER — Ambulatory Visit: Payer: Medicaid Other | Attending: Internal Medicine | Admitting: Internal Medicine

## 2013-03-23 VITALS — BP 136/88 | HR 59 | Temp 98.2°F | Resp 18 | Ht 64.0 in | Wt 146.0 lb

## 2013-03-23 DIAGNOSIS — J449 Chronic obstructive pulmonary disease, unspecified: Secondary | ICD-10-CM

## 2013-03-23 DIAGNOSIS — I10 Essential (primary) hypertension: Secondary | ICD-10-CM | POA: Insufficient documentation

## 2013-03-23 DIAGNOSIS — F17200 Nicotine dependence, unspecified, uncomplicated: Secondary | ICD-10-CM | POA: Insufficient documentation

## 2013-03-23 MED ORDER — NICOTINE 14 MG/24HR TD PT24: 1.0000 | MEDICATED_PATCH | TRANSDERMAL | Status: DC

## 2013-03-23 NOTE — Progress Notes (Signed)
PT HERE FOR LAB RESULT F/U REQUESTING PATCH TO STOP SMOKING. STATES HE HAS CUT BACK TO 1/2 PACK/DAY SATS 98% R/A

## 2013-03-23 NOTE — Progress Notes (Signed)
Patient ID: Samuel Reyes, male   DOB: 1955-08-20, 57 y.o.   MRN: [id number]  CC: Followup  HPI: [name] is a 57 years old woman who came in here today for followup visit. He has extensive smoking history and was recently sent for spirometric examination, he is here today to discuss the result. Your results show COPD with a long age of 31 years. Patient said he is ready to quit smoking, and needs nicotine patches.  Allergies  Allergen Reactions  . Bee Venom Anaphylaxis  . Penicillins Rash   Past Medical History  Diagnosis Date  . Cirrhosis   . Degenerative joint disease   . Hypertension   . Gastric ulcer   . Depression   . Asthma   . Mitral valve prolapse 2002  . H/O hiatal hernia   . Hiatal hernia 1982   Current Outpatient Prescriptions on File Prior to Visit  Medication Sig Dispense Refill  . albuterol (PROVENTIL HFA;VENTOLIN HFA) 108 (90 BASE) MCG/ACT inhaler Inhale 2 puffs into the lungs every 6 (six) hours as needed for wheezing.      Marland Kitchen albuterol-ipratropium (COMBIVENT) 18-103 MCG/ACT inhaler Inhale 2 puffs into the lungs 4 (four) times daily.  1 Inhaler  3  . ARIPiprazole (ABILIFY) 5 MG tablet Take 1 tablet (5 mg total) by mouth daily. For mood stability.  30 tablet  0  . FLUoxetine (PROZAC) 10 MG capsule Take 1 capsule (10 mg total) by mouth daily. For depression.  30 capsule  0  . gabapentin (NEURONTIN) 300 MG capsule Take 300 mg by mouth 2 (two) times daily.      . hydrochlorothiazide (HYDRODIURIL) 12.5 MG tablet Take 1 tablet (12.5 mg total) by mouth daily.  30 tablet  3  . metoprolol tartrate (LOPRESSOR) 25 MG tablet Take 1 tablet (25 mg total) by mouth 2 (two) times daily.      . mirtazapine (REMERON) 15 MG tablet Take 1 tablet (15 mg total) by mouth at bedtime. For depression and sleep.  30 tablet  0  . pantoprazole (PROTONIX) 20 MG tablet Take 1 tablet (20 mg total) by mouth 2 (two) times daily.      . Aspirin-Acetaminophen-Caffeine (GOODYS EXTRA STRENGTH)  [phone] MG PACK Take 5 packets by mouth as needed (Use less than current use (5x /day)).      . naproxen (NAPROSYN) 250 MG tablet Take 1 tablet (250 mg total) by mouth 2 (two) times daily with a meal. For knee pain  60 tablet  0   No current facility-administered medications on file prior to visit.   History reviewed. No pertinent family history. History   Social History  . Marital Status: Single    Spouse Name: N/A    Number of Children: N/A  . Years of Education: N/A   Occupational History  . Disability    Social History Main Topics  . Smoking status: Current Every Day Smoker -- 1.50 packs/day for 45 years    Types: Cigarettes    Start date: 07/30/1966  . Smokeless tobacco: Never Used     Comment: Quit date anticipated 03/13/2013  . Alcohol Use: 25.2 oz/week    42 Cans of beer per week  . Drug Use: No     Comment: PT REQUESTING PATCH. INFORMED NOT TO SMOKE WITH PATCH  . Sexual Activity: Not Currently   Other Topics Concern  . Not on file   Social History Narrative  . No narrative on file    Review of Systems: Constitutional:  Negative for fever, chills, diaphoresis, activity change, appetite change and fatigue. HENT: Negative for ear pain, nosebleeds, congestion, facial swelling, rhinorrhea, neck pain, neck stiffness and ear discharge.  Eyes: Negative for pain, discharge, redness, itching and visual disturbance. Respiratory: Negative for cough, choking, chest tightness, shortness of breath, wheezing and stridor.  Cardiovascular: Negative for chest pain, palpitations and leg swelling. Gastrointestinal: Negative for abdominal distention. Genitourinary: Negative for dysuria, urgency, frequency, hematuria, flank pain, decreased urine volume, difficulty urinating and dyspareunia.  Musculoskeletal: Negative for back pain, joint swelling, arthralgias and gait problem. Neurological: Negative for dizziness, tremors, seizures, syncope, facial asymmetry, speech difficulty,  weakness, light-headedness, numbness and headaches.  Hematological: Negative for adenopathy. Does not bruise/bleed easily. Psychiatric/Behavioral: Negative for hallucinations, behavioral problems, confusion, dysphoric mood, decreased concentration and agitation.    Objective:   Filed Vitals:   03/23/13 1259  BP: 136/88  Pulse: 59  Temp: 98.2 F (36.8 C)  Resp: 18    Physical Exam: Constitutional: Patient appears well-developed and well-nourished. No distress. HENT: Normocephalic, atraumatic, External right and left ear normal. Oropharynx is clear and moist.  Eyes: Conjunctivae and EOM are normal. PERRLA, no scleral icterus. Neck: Normal ROM. Neck supple. No JVD. No tracheal deviation. No thyromegaly. CVS: RRR, S1/S2 +, no murmurs, no gallops, no carotid bruit.  Pulmonary: Effort and breath sounds normal, no stridor, rhonchi, wheezes, rales.  Abdominal: Soft. BS +,  no distension, tenderness, rebound or guarding.  Musculoskeletal: Normal range of motion. No edema and no tenderness.  Lymphadenopathy: No lymphadenopathy noted, cervical, inguinal or axillary Neuro: Alert. Normal reflexes, muscle tone coordination. No cranial nerve deficit. Skin: Skin is warm and dry. No rash noted. Not diaphoretic. No erythema. No pallor. Psychiatric: Normal mood and affect. Behavior, judgment, thought content normal.  Lab Results  Component Value Date   WBC 10.6* 02/17/2013   HGB 15.4 02/17/2013   HCT 45.5 02/17/2013   MCV 84.9 02/17/2013   PLT 448* 02/17/2013   Lab Results  Component Value Date   CREATININE 0.87 02/17/2013   BUN 9 02/17/2013   NA 138 02/17/2013   K 4.9 02/17/2013   CL 98 02/17/2013   CO2 28 02/17/2013    No results found for this basename: HGBA1C   Lipid Panel     Component Value Date/Time   CHOL 247* 02/17/2013 1541   TRIG 144 02/17/2013 1541   HDL 79 02/17/2013 1541   CHOLHDL 3.1 02/17/2013 1541   VLDL 29 02/17/2013 1541   LDLCALC 139* 02/17/2013 1541       Assessment and  plan:   Patient Active Problem List   Diagnosis Date Noted  . Unspecified essential hypertension 02/17/2013  . FH: lung cancer 02/17/2013  . COPD (chronic obstructive pulmonary disease) 02/17/2013  . Smoker 02/17/2013  . GERD (gastroesophageal reflux disease) 02/17/2013  . Hiatal hernia 02/17/2013  . Bereavement 02/02/2013  . Depression 02/02/2013   Nicotine patches prescribe Patient strongly advised to quit smoking Patient strongly advised to continue the use of inhalers as prescribed Patient encouraged to exercise as tolerated  Nutritional counseling done   Lamir Racca was given clear instructions to go to ER or return to the clinic if symptoms don't improve, worsen or new problems develop.  Winter Trefz verbalized understanding.  Sohil Timko was told to call to get lab results if hasn't heard anything in the next week.    I STRONGLY RECOMMEND THAT YOU STOP SMOKING AND USING ALL TOBACCO / NICOTINE PRODUCTS.   Smoking cessation specialist [phone]  Jeanann Lewandowsky, MD [facility] [place], [place] [phone]   03/23/2013, 1:36 PM

## 2013-03-23 NOTE — Patient Instructions (Signed)
Chronic Obstructive Pulmonary Disease Chronic obstructive pulmonary disease (COPD) is a condition in which airflow from the lungs is restricted. The lungs can never return to normal, but there are measures you can take which will improve them and make you feel better. CAUSES   Smoking.  Exposure to secondhand smoke.  Breathing in irritants such as air pollution, dust, cigarette smoke, strong odors, aerosol sprays, or paint fumes.  History of lung infections. SYMPTOMS   Deep, persistent (chronic) cough with a large amount of thick mucus.  Wheezing.  Shortness of breath, especially with physical activity.  Feeling like you cannot get enough air.  Difficulty breathing.  Rapid breaths (tachypnea).  Gray or bluish discoloration (cyanosis) of the skin, especially in fingers, toes, or lips.  Fatigue.  Weight loss.  Swelling in legs, ankles, or feet.  Fast heartbeat (tachycardia).  Frequent lung infections.   Chest tightness. DIAGNOSIS  Initial diagnosis may be based on your history, symptoms, and physical examination. Additional tests for COPD may include:  Chest X-ray.  Computed tomography (CT) scan.  Lung (pulmonary) function tests.  Blood tests. TREATMENT  Treatment focuses on making you comfortable (supportive care). Your caregiver may prescribe medicines (inhaled or pills) to help improve your breathing. Additional treatment options may include oxygen therapy and pulmonary rehabilitation. Treatment should also include reducing your exposure to known irritants and following a plan to stop smoking. HOME CARE INSTRUCTIONS   Take all medicines, including antibiotic medicines, as directed by your caregiver.  Use inhaled medicines as directed by your caregiver.  Avoid medicines or cough syrups that dry up your airway (antihistamines) and slow down the elimination of secretions. This decreases respiratory capacity and may lead to infections.  If you smoke, stop  smoking.  Avoid exposure to smoke, chemicals, and fumes that aggravate your breathing.  Avoid contact with individuals that have a contagious illness.  Avoid extreme temperature and humidity changes.  Use humidifiers at home and at your bedside if they do not make breathing difficult.  Drink enough water and fluids to keep your urine clear or pale yellow. This loosens secretions.  Eat healthy foods. Eating smaller, more frequent meals and resting before meals may help you maintain your strength.  Ask your caregiver about the use of vitamins and mineral supplements.  Stay active. Exercise and physical activity will help maintain your ability to do things you want to do.  Balance activity with periods of rest.  Assume a position of comfort if you become short of breath.  Learn and use relaxation techniques.  Learn and use controlled breathing techniques as directed by your caregiver. Controlled breathing techniques include:  Pursed lip breathing. This breathing technique starts with breathing in (inhaling) through your nose for 1 second. Next, purse your lips as if you were going to whistle. Then breathe out (exhale) through the pursed lips for 2 seconds.  Diaphragmatic breathing. Start by putting one hand on your abdomen just above your waist. Inhale slowly through your nose. The hand on your abdomen should move out. Then exhale slowly through pursed lips. You should be able to feel the hand on your abdomen moving in as you exhale.  Learn and use controlled coughing to clear mucus from your lungs. Controlled coughing is a series of short, progressive coughs. The steps of controlled coughing are: 1. Lean your head slightly forward. 2. Breathe in deeply using diaphragmatic breathing. 3. Try to hold your breath for 3 seconds. 4. Keep your mouth slightly open while coughing twice.   your lungs. Controlled coughing is a series of short, progressive coughs. The steps of controlled coughing are:  1. Lean your head slightly forward.  2. Breathe in deeply using diaphragmatic breathing.  3. Try to hold your breath for 3 seconds.  4. Keep your mouth slightly open while coughing twice.  5. Spit any mucus out into a tissue.  6. Rest and repeat the steps once or twice as needed.   Receive all  protective vaccines your caregiver suggests, especially pneumococcal and influenza vaccines.   Learn to manage stress.   Schedule and attend all follow-up appointments as directed by your caregiver. It is important to keep all your appointments.   Participate in pulmonary rehabilitation as directed by your caregiver.   Use home oxygen as suggested.  SEEK MEDICAL CARE IF:    You are coughing up more mucus than usual.   There is a change in the color or thickness of the mucus.   Breathing is more labored than usual.   Your breathing is faster than usual.   Your skin color is more cyanotic than usual.   You are running out of the medicine you take for your breathing.   You are anxious, apprehensive, or restless.   You have a fever.  SEEK IMMEDIATE MEDICAL CARE IF:    You have a rapid heart rate.   You have shortness of breath while you are resting.   You have shortness of breath that prevents you from being able to talk.   You have shortness of breath that prevents you from performing your usual physical activities.   You have chest pain lasting longer than 5 minutes.   You have a seizure.   Your family or friends notice that you are agitated or confused.  MAKE SURE YOU:    Understand these instructions.   Will watch your condition.   Will get help right away if you are not doing well or get worse.  Document Released: 04/25/2005 Document Revised: 04/09/2012 Document Reviewed: 09/15/2010  Exit[employer] Patient Information 2014 [employer]  Smoking Cessation  Quitting smoking is important to your health and has many advantages. However, it is not always easy to quit since nicotine is a very addictive drug. Often times, people try 3 times or more before being able to quit. This document explains the best ways for you to prepare to quit smoking. Quitting takes hard work and a lot of effort, but you can do it.  ADVANTAGES OF QUITTING SMOKING   You will live longer, feel better, and live  better.   Your body will feel the impact of quitting smoking almost immediately.   Within 20 minutes, blood pressure decreases. Your pulse returns to its normal level.   After 8 hours, carbon monoxide levels in the blood return to normal. Your oxygen level increases.   After 24 hours, the chance of having a heart attack starts to decrease. Your breath, hair, and body stop smelling like smoke.   After 48 hours, damaged nerve endings begin to recover. Your sense of taste and smell improve.   After 72 hours, the body is virtually free of nicotine. Your bronchial tubes relax and breathing becomes easier.   After 2 to 12 weeks, lungs can hold more air. Exercise becomes easier and circulation improves.   The risk of having a heart attack, stroke, cancer, or lung disease is greatly reduced.   After 1 year, the risk of coronary heart disease is cut in half.     After 5 years, the risk of stroke falls to the same as a nonsmoker.   After 10 years, the risk of lung cancer is cut in half and the risk of other cancers decreases significantly.   After 15 years, the risk of coronary heart disease drops, usually to the level of a nonsmoker.   If you are pregnant, quitting smoking will improve your chances of having a healthy baby.   The people you live with, especially any children, will be healthier.   You will have extra money to spend on things other than cigarettes.  QUESTIONS TO THINK ABOUT BEFORE ATTEMPTING TO QUIT  You may want to talk about your answers with your caregiver.   Why do you want to quit?   If you tried to quit in the past, what helped and what did not?   What will be the most difficult situations for you after you quit? How will you plan to handle them?   Who can help you through the tough times? Your family? Friends? A caregiver?   What pleasures do you get from smoking? What ways can you still get pleasure if you quit?  Here are some questions to ask your caregiver:   How can you help me to  be successful at quitting?   What medicine do you think would be best for me and how should I take it?   What should I do if I need more help?   What is smoking withdrawal like? How can I get information on withdrawal?  GET READY   Set a quit date.   Change your environment by getting rid of all cigarettes, ashtrays, matches, and lighters in your home, car, or work. Do not let people smoke in your home.   Review your past attempts to quit. Think about what worked and what did not.  GET SUPPORT AND ENCOURAGEMENT  You have a better chance of being successful if you have help. You can get support in many ways.   Tell your family, friends, and co-workers that you are going to quit and need their support. Ask them not to smoke around you.   Get individual, group, or telephone counseling and support. Programs are available at local hospitals and health centers. Call your local health department for information about programs in your area.   Spiritual beliefs and practices may help some smokers quit.   Download a "quit meter" on your computer to keep track of quit statistics, such as how long you have gone without smoking, cigarettes not smoked, and money saved.   Get a self-help book about quitting smoking and staying off of tobacco.  LEARN NEW SKILLS AND BEHAVIORS   Distract yourself from urges to smoke. Talk to someone, go for a walk, or occupy your time with a task.   Change your normal routine. Take a different route to work. Drink tea instead of coffee. Eat breakfast in a different place.   Reduce your stress. Take a hot bath, exercise, or read a book.   Plan something enjoyable to do every day. Reward yourself for not smoking.   Explore interactive web-based programs that specialize in helping you quit.  GET MEDICINE AND USE IT CORRECTLY  Medicines can help you stop smoking and decrease the urge to smoke. Combining medicine with the above behavioral methods and support can greatly increase your  chances of successfully quitting smoking.   Nicotine replacement therapy helps deliver nicotine to your body without the negative effects and   risks of smoking. Nicotine replacement therapy includes nicotine gum, lozenges, inhalers, nasal sprays, and skin patches. Some may be available over-the-counter and others require a prescription.   Antidepressant medicine helps people abstain from smoking, but how this works is unknown. This medicine is available by prescription.   Nicotinic receptor partial agonist medicine simulates the effect of nicotine in your brain. This medicine is available by prescription.  Ask your caregiver for advice about which medicines to use and how to use them based on your health history. Your caregiver will tell you what side effects to look out for if you choose to be on a medicine or therapy. Carefully read the information on the package. Do not use any other product containing nicotine while using a nicotine replacement product.   RELAPSE OR DIFFICULT SITUATIONS  Most relapses occur within the first 3 months after quitting. Do not be discouraged if you start smoking again. Remember, most people try several times before finally quitting. You may have symptoms of withdrawal because your body is used to nicotine. You may crave cigarettes, be irritable, feel very hungry, cough often, get headaches, or have difficulty concentrating. The withdrawal symptoms are only temporary. They are strongest when you first quit, but they will go away within 10 14 days.  To reduce the chances of relapse, try to:   Avoid drinking alcohol. Drinking lowers your chances of successfully quitting.   Reduce the amount of caffeine you consume. Once you quit smoking, the amount of caffeine in your body increases and can give you symptoms, such as a rapid heartbeat, sweating, and anxiety.   Avoid smokers because they can make you want to smoke.   Do not let weight gain distract you. Many smokers will gain  weight when they quit, usually less than 10 pounds. Eat a healthy diet and stay active. You can always lose the weight gained after you quit.   Find ways to improve your mood other than smoking.  FOR MORE INFORMATION   www.smokefree.gov   Document Released: 07/10/2001 Document Revised: 01/15/2012 Document Reviewed: 10/25/2011  [employer] Patient Information 2014 [employer].

## 2013-06-15 ENCOUNTER — Encounter: Payer: Self-pay | Admitting: Internal Medicine

## 2013-06-15 ENCOUNTER — Ambulatory Visit: Payer: Medicaid Other | Attending: Internal Medicine | Admitting: Internal Medicine

## 2013-06-15 VITALS — BP 138/83 | HR 77 | Temp 98.3°F | Resp 16 | Ht 64.0 in | Wt 162.0 lb

## 2013-06-15 DIAGNOSIS — F329 Major depressive disorder, single episode, unspecified: Secondary | ICD-10-CM

## 2013-06-15 DIAGNOSIS — J449 Chronic obstructive pulmonary disease, unspecified: Secondary | ICD-10-CM

## 2013-06-15 DIAGNOSIS — M25561 Pain in right knee: Secondary | ICD-10-CM

## 2013-06-15 MED ORDER — GABAPENTIN 300 MG PO CAPS: 300.0000 mg | ORAL_CAPSULE | Freq: Two times a day (BID) | ORAL | Status: DC

## 2013-06-15 MED ORDER — IPRATROPIUM-ALBUTEROL 18-103 MCG/ACT IN AERO: 2.0000 | INHALATION_SPRAY | Freq: Two times a day (BID) | RESPIRATORY_TRACT | Status: DC

## 2013-06-15 MED ORDER — FLUOXETINE HCL 10 MG PO CAPS: 10.0000 mg | ORAL_CAPSULE | Freq: Every day | ORAL | Status: DC

## 2013-06-15 MED ORDER — MIRTAZAPINE 15 MG PO TABS: 15.0000 mg | ORAL_TABLET | Freq: Every day | ORAL | Status: DC

## 2013-06-15 MED ORDER — NAPROXEN 250 MG PO TABS: 250.0000 mg | ORAL_TABLET | Freq: Two times a day (BID) | ORAL | Status: DC

## 2013-06-15 MED ORDER — ALBUTEROL SULFATE HFA 108 (90 BASE) MCG/ACT IN AERS: 2.0000 | INHALATION_SPRAY | Freq: Four times a day (QID) | RESPIRATORY_TRACT | Status: DC | PRN

## 2013-06-15 MED ORDER — PANTOPRAZOLE SODIUM 20 MG PO TBEC: 20.0000 mg | DELAYED_RELEASE_TABLET | Freq: Two times a day (BID) | ORAL | Status: DC

## 2013-06-15 MED ORDER — ARIPIPRAZOLE 5 MG PO TABS: 5.0000 mg | ORAL_TABLET | Freq: Every day | ORAL | Status: DC

## 2013-06-15 MED ORDER — METOPROLOL TARTRATE 25 MG PO TABS: 25.0000 mg | ORAL_TABLET | Freq: Two times a day (BID) | ORAL | Status: DC

## 2013-06-15 MED ORDER — HYDROCHLOROTHIAZIDE 12.5 MG PO TABS: 12.5000 mg | ORAL_TABLET | Freq: Every day | ORAL | Status: DC

## 2013-06-15 NOTE — Progress Notes (Unsigned)
Pt is here following up on his COPD and HTN. Pt reports that his right knee is causing him pain.

## 2013-06-15 NOTE — Progress Notes (Unsigned)
Patient ID: Samuel Reyes, male   DOB: 1956-06-19, 57 y.o.   MRN: [id number] Patient Demographics  Samuel Reyes, is a 57 y.o. male  [id number]  [id number]  DOB - 1956-04-13  Chief Complaint  Patient presents with  . Follow-up        Subjective:   Samuel Reyes today is here for a follow up visit.  The patient is a 57 year old male with history of mild COPD, hypertension, controlled here for followup visit. Patient reports that he is quit smoking. No complaints except right knee pain, has been having falls and right knee giving out  Patient has No headache, No chest pain, No abdominal pain - No Nausea, No new weakness tingling or numbness, No Cough - SOB.  Objective:    Filed Vitals:   06/15/13 1654  BP: 138/83  Pulse: 77  Temp: 98.3 F (36.8 C)  TempSrc: Oral  Resp: 16  Height: 5\' 4"  (1.626 m)  Weight: 162 lb (73.483 kg)  SpO2: 97%     ALLERGIES:   Allergies  Allergen Reactions  . Bee Venom Anaphylaxis  . Penicillins Rash    PAST MEDICAL HISTORY: Past Medical History  Diagnosis Date  . Cirrhosis   . Degenerative joint disease   . Hypertension   . Gastric ulcer   . Depression   . Asthma   . Mitral valve prolapse 2002  . H/O hiatal hernia   . Hiatal hernia 1982    MEDICATIONS AT HOME: Prior to Admission medications   Medication Sig Start Date End Date Taking? Authorizing Provider  albuterol (PROVENTIL HFA;VENTOLIN HFA) 108 (90 BASE) MCG/ACT inhaler Inhale 2 puffs into the lungs every 6 (six) hours as needed for wheezing or shortness of breath. 06/15/13  Yes Tarhonda Hollenberg Jenna Luo, MD  albuterol-ipratropium (COMBIVENT) 18-103 MCG/ACT inhaler Inhale 2 puffs into the lungs 2 (two) times daily. 06/15/13  Yes Camry Robello Jenna Luo, MD  ARIPiprazole (ABILIFY) 5 MG tablet Take 1 tablet (5 mg total) by mouth daily. For mood stability. 06/15/13  Yes Caili Escalera Jenna Luo, MD  Aspirin-Acetaminophen-Caffeine (GOODYS EXTRA STRENGTH) [phone] MG PACK Take 5 packets by mouth  as needed (Use less than current use (5x /day)).   Yes Historical Provider, MD  FLUoxetine (PROZAC) 10 MG capsule Take 1 capsule (10 mg total) by mouth daily. For depression. 06/15/13  Yes Tonita Bills Jenna Luo, MD  naproxen (NAPROSYN) 250 MG tablet Take 1 tablet (250 mg total) by mouth 2 (two) times daily with a meal. For knee pain 06/15/13  Yes Deztinee Lohmeyer K Jabori Henegar, MD  gabapentin (NEURONTIN) 300 MG capsule Take 1 capsule (300 mg total) by mouth 2 (two) times daily. 06/15/13   Gladys Gutman Jenna Luo, MD  hydrochlorothiazide (HYDRODIURIL) 12.5 MG tablet Take 1 tablet (12.5 mg total) by mouth daily. 06/15/13   Celest Reitz Jenna Luo, MD  metoprolol tartrate (LOPRESSOR) 25 MG tablet Take 1 tablet (25 mg total) by mouth 2 (two) times daily. 06/15/13   Delila Kuklinski Jenna Luo, MD  mirtazapine (REMERON) 15 MG tablet Take 1 tablet (15 mg total) by mouth at bedtime. For depression and sleep. 06/15/13   Aashir Umholtz Jenna Luo, MD  nicotine (NICODERM CQ - DOSED IN MG/24 HOURS) 14 mg/24hr patch Place 1 patch onto the skin daily. 03/23/13   Jeanann Lewandowsky, MD  pantoprazole (PROTONIX) 20 MG tablet Take 1 tablet (20 mg total) by mouth 2 (two) times daily. 06/15/13   Jaspal Pultz Jenna Luo, MD     Exam  General appearance :Awake, alert, NAD, Speech Clear.  HEENT:  Atraumatic and Normocephalic, PERLA Neck: supple, no JVD. No cervical lymphadenopathy.  Chest: Clear to auscultation bilaterally, no wheezing, rales or rhonchi CVS: S1 S2 regular, no murmurs.  Abdomen: soft, NBS, NT, ND, no gaurding, rigidity or rebound. Extremities: no cyanosis or clubbing, B/L Lower Ext shows no edema Neurology: Awake alert, and oriented X 3, CN II-XII intact, Non focal Skin: No Rash or lesions Wounds:N/A    Data Review   Basic Metabolic Panel: No results found for this basename: NA, K, CL, CO2, GLUCOSE, BUN, CREATININE, CALCIUM, MG, PHOS,  in the last 168 hours Liver Function Tests: No results found for this basename: AST, ALT, ALKPHOS, BILITOT, PROT, ALBUMIN,  in the  last 168 hours  CBC: No results found for this basename: WBC, NEUTROABS, HGB, HCT, MCV, PLT,  in the last 168 hours  ------------------------------------------------------------------------------------------------------------------ No results found for this basename: HGBA1C,  in the last 72 hours ------------------------------------------------------------------------------------------------------------------ No results found for this basename: CHOL, HDL, LDLCALC, TRIG, CHOLHDL, LDLDIRECT,  in the last 72 hours ------------------------------------------------------------------------------------------------------------------ No results found for this basename: TSH, T4TOTAL, FREET3, T3FREE, THYROIDAB,  in the last 72 hours ------------------------------------------------------------------------------------------------------------------ No results found for this basename: VITAMINB12, FOLATE, FERRITIN, TIBC, IRON, RETICCTPCT,  in the last 72 hours  Coagulation profile  No results found for this basename: INR, PROTIME,  in the last 168 hours    Assessment & Plan   Active Problems: Mild COPD currently stable - Patient has quit smoking, continue rescue inhaler every 6 hours as needed and Combivent 2 puffs BID   - if patient has any wheezing, will benfit from advair or dulera   Hypertension: Stable refilled metoprolol, HCTZ,   GERD: cont PPI  Right knee pain: obtain right knee Xray  Depression: gets all his MEDS from [facility] screening  Flu shot today   Follow-up in 3 months     Sydelle Sherfield M.D. 06/15/2013, 5:20 PM

## 2013-06-17 ENCOUNTER — Ambulatory Visit (HOSPITAL_COMMUNITY)
Admission: RE | Admit: 2013-06-17 | Discharge: 2013-06-17 | Disposition: A | Discharge status: Home / Self Care | Payer: Medicaid Other | Source: Ambulatory Visit | Attending: Internal Medicine | Admitting: Internal Medicine

## 2013-06-17 DIAGNOSIS — IMO0002 Reserved for concepts with insufficient information to code with codable children: Secondary | ICD-10-CM | POA: Insufficient documentation

## 2013-06-17 DIAGNOSIS — M1710 Unilateral primary osteoarthritis, unspecified knee: Secondary | ICD-10-CM | POA: Insufficient documentation

## 2013-06-17 DIAGNOSIS — M25561 Pain in right knee: Secondary | ICD-10-CM

## 2013-06-17 DIAGNOSIS — M25569 Pain in unspecified knee: Secondary | ICD-10-CM | POA: Insufficient documentation

## 2013-09-15 ENCOUNTER — Ambulatory Visit: Payer: Self-pay | Admitting: Internal Medicine

## 2013-10-28 ENCOUNTER — Encounter: Payer: Self-pay | Admitting: Internal Medicine

## 2013-10-28 ENCOUNTER — Ambulatory Visit: Payer: Medicaid Other | Attending: Internal Medicine | Admitting: Internal Medicine

## 2013-10-28 VITALS — BP 146/79 | HR 89 | Temp 98.1°F | Resp 17 | Wt 161.8 lb

## 2013-10-28 DIAGNOSIS — IMO0001 Reserved for inherently not codable concepts without codable children: Secondary | ICD-10-CM

## 2013-10-28 DIAGNOSIS — M1990 Unspecified osteoarthritis, unspecified site: Secondary | ICD-10-CM

## 2013-10-28 DIAGNOSIS — M25561 Pain in right knee: Secondary | ICD-10-CM

## 2013-10-28 DIAGNOSIS — M25569 Pain in unspecified knee: Secondary | ICD-10-CM | POA: Insufficient documentation

## 2013-10-28 DIAGNOSIS — I10 Essential (primary) hypertension: Secondary | ICD-10-CM

## 2013-10-28 DIAGNOSIS — F17200 Nicotine dependence, unspecified, uncomplicated: Secondary | ICD-10-CM | POA: Insufficient documentation

## 2013-10-28 MED ORDER — NAPROXEN 500 MG PO TABS: 500.0000 mg | ORAL_TABLET | Freq: Two times a day (BID) | ORAL | Status: DC

## 2013-10-28 NOTE — Progress Notes (Signed)
Patient here for follow up Would like x ray results of his right knee Still having pain to the right knee

## 2013-10-28 NOTE — Progress Notes (Signed)
MRN: [id number] Name: Samuel Reyes  Sex: male Age: 58 y.o. DOB: 1956/03/30  Allergies: Bee venom and Penicillins  Chief Complaint  Patient presents with  . Follow-up    HPI: Patient is 58 y.o. male who comes today reported to have chronic right knee pain, patient had x-ray done which reported to have DJD, as per patient in the past he had steroid injections given as well as had arthroscopic surgery done, patient is requesting pain medication, patient also history of hypertension today borderline blood pressure is high patient also smokes cigarettes, advised patient to quit smoking.  Past Medical History  Diagnosis Date  . Cirrhosis   . Degenerative joint disease   . Hypertension   . Gastric ulcer   . Depression   . Asthma   . Mitral valve prolapse 2002  . H/O hiatal hernia   . Hiatal hernia 1982    Past Surgical History  Procedure Laterality Date  . Gastrectomy    . Hernia repair    . Back surgery    . Shoulder surgery    . Rt knee arthroscopic surgery        Medication List       This list is accurate as of: 10/28/13 11:18 AM.  Always use your most recent med list.               albuterol 108 (90 BASE) MCG/ACT inhaler  Commonly known as:  PROVENTIL HFA;VENTOLIN HFA  Inhale 2 puffs into the lungs every 6 (six) hours as needed for wheezing or shortness of breath.     albuterol-ipratropium 18-103 MCG/ACT inhaler  Commonly known as:  COMBIVENT  Inhale 2 puffs into the lungs 2 (two) times daily.     ARIPiprazole 5 MG tablet  Commonly known as:  ABILIFY  Take 1 tablet (5 mg total) by mouth daily. For mood stability.     FLUoxetine 10 MG capsule  Commonly known as:  PROZAC  Take 1 capsule (10 mg total) by mouth daily. For depression.     gabapentin 300 MG capsule  Commonly known as:  NEURONTIN  Take 1 capsule (300 mg total) by mouth 2 (two) times daily.     GOODYS EXTRA STRENGTH 500-325-65 MG Pack  Generic drug:  Aspirin-Acetaminophen-Caffeine  Take  5 packets by mouth as needed (Use less than current use (5x /day)).     hydrochlorothiazide 12.5 MG tablet  Commonly known as:  HYDRODIURIL  Take 1 tablet (12.5 mg total) by mouth daily.     metoprolol tartrate 25 MG tablet  Commonly known as:  LOPRESSOR  Take 1 tablet (25 mg total) by mouth 2 (two) times daily.     mirtazapine 15 MG tablet  Commonly known as:  REMERON  Take 1 tablet (15 mg total) by mouth at bedtime. For depression and sleep.     naproxen 500 MG tablet  Commonly known as:  NAPROSYN  Take 1 tablet (500 mg total) by mouth 2 (two) times daily with a meal. For knee pain     nicotine 14 mg/24hr patch  Commonly known as:  NICODERM CQ - dosed in mg/24 hours  Place 1 patch onto the skin daily.     pantoprazole 20 MG tablet  Commonly known as:  PROTONIX  Take 1 tablet (20 mg total) by mouth 2 (two) times daily.        Meds ordered this encounter  Medications  . naproxen (NAPROSYN) 500 MG tablet    Sig:  Take 1 tablet (500 mg total) by mouth 2 (two) times daily with a meal. For knee pain    Dispense:  60 tablet    Refill:  4    Immunization History  Administered Date(s) Administered  . Influenza Split 06/15/2013  . Pneumococcal Polysaccharide-23 02/01/2013    Family History  Problem Relation Age of Onset  . Cancer Father   . Cancer Brother   . Stroke Maternal Grandmother     History  Substance Use Topics  . Smoking status: Current Every Day Smoker -- 1.50 packs/day for 45 years    Types: Cigarettes    Start date: 07/30/1966  . Smokeless tobacco: Never Used     Comment: Quit date anticipated 03/13/2013  . Alcohol Use: 25.2 oz/week    42 Cans of beer per week    Review of Systems   As noted in HPI  Filed Vitals:   10/28/13 1104  BP: 146/79  Pulse: 89  Temp: 98.1 F (36.7 C)  Resp: 17    Physical Exam  Physical Exam  Constitutional: No distress.  Eyes: EOM are normal. Pupils are equal, round, and reactive to light.  Cardiovascular:  Normal rate and regular rhythm.   Pulmonary/Chest: Breath sounds normal. No respiratory distress. He has no wheezes. He has no rales.  Musculoskeletal:  Right knee crepitation+  Some tenderness medially, no warmth to touch     CBC    Component Value Date/Time   WBC 10.6* 02/17/2013 1541   RBC 5.36 02/17/2013 1541   HGB 15.4 02/17/2013 1541   HCT 45.5 02/17/2013 1541   PLT 448* 02/17/2013 1541   MCV 84.9 02/17/2013 1541   LYMPHSABS 3.3 01/29/2013 1300   MONOABS 0.5 01/29/2013 1300   EOSABS 0.0 01/29/2013 1300   BASOSABS 0.1 01/29/2013 1300    CMP     Component Value Date/Time   NA 138 02/17/2013 1541   K 4.9 02/17/2013 1541   CL 98 02/17/2013 1541   CO2 28 02/17/2013 1541   GLUCOSE 94 02/17/2013 1541   BUN 9 02/17/2013 1541   CREATININE 0.87 02/17/2013 1541   CREATININE 0.82 01/29/2013 1300   CALCIUM 10.8* 02/17/2013 1541   PROT 7.8 02/17/2013 1541   ALBUMIN 4.6 02/17/2013 1541   AST 17 02/17/2013 1541   ALT 13 02/17/2013 1541   ALKPHOS 75 02/17/2013 1541   BILITOT 0.6 02/17/2013 1541   GFRNONAA >89 02/17/2013 1541   GFRNONAA >90 01/29/2013 1300   GFRAA >89 02/17/2013 1541   GFRAA >90 01/29/2013 1300    Lab Results  Component Value Date/Time   CHOL 247* 02/17/2013  3:41 PM    No components found with this basename: hga1c    Lab Results  Component Value Date/Time   AST 17 02/17/2013  3:41 PM    Assessment and Plan  Knee pain, right - Plan: naproxen (NAPROSYN) 500 MG tablet, Ambulatory referral to Orthopedic Surgery  Unspecified essential hypertension Advise patient for DASH diet continue with current medication.  DJD (degenerative joint disease) - Plan: naproxen (NAPROSYN) 500 MG tablet, Ambulatory referral to Orthopedic Surgery  Smoking Advise patient to quit smoking   Return if symptoms worsen or fail to improve. follow up as scheduled.  Lorayne Marek, MD

## 2013-11-13 ENCOUNTER — Ambulatory Visit: Payer: Self-pay | Admitting: Family Medicine

## 2013-11-23 ENCOUNTER — Ambulatory Visit (INDEPENDENT_AMBULATORY_CARE_PROVIDER_SITE_OTHER): Payer: Medicaid Other | Admitting: Family Medicine

## 2013-11-23 ENCOUNTER — Encounter: Payer: Self-pay | Admitting: Family Medicine

## 2013-11-23 VITALS — BP 136/87 | HR 76 | Ht 64.0 in | Wt 161.0 lb

## 2013-11-23 DIAGNOSIS — M25569 Pain in unspecified knee: Secondary | ICD-10-CM

## 2013-11-23 MED ORDER — METHYLPREDNISOLONE ACETATE 40 MG/ML IJ SUSP
40.0000 mg | Freq: Once | INTRAMUSCULAR | Status: AC
  Administered 2013-11-23: 40 mg via INTRA_ARTICULAR

## 2013-11-23 NOTE — Assessment & Plan Note (Signed)
Reviewed his x-rays. These were non-standing but appear to show some moderate tricompartmental OA.

## 2013-11-23 NOTE — Progress Notes (Signed)
   Subjective:    Patient ID: Samuel Reyes, male    DOB: 09-Oct-1955, 58 y.o.   MRN: [id number]  HPI Left knee pain for 2-3 years. Had an arthroscopic 9 2007. Has significant improvement after that for several years. Over the last couple of years he's had increasing episodes of acute sharp pain that causes knee give way secondary to the pain. He's had a few falls secondary to this. Knee aches most of the time 2-3/10. On bad days it hurts 5-6/10. He occasionally has some mild swelling but no erythema or warmth. Has had no specific injury that he is aware of.  PERTINENT  PMH / PSH: Has had multiple shoulder surgeries for osteoarthritis. Smoker. Previously worked as a Clinical research associate.   Review of Systems No fever, sweats, chills.    Objective:   Physical Exam Vital signs are reviewed GENERAL: Well-developed male no acute distress KNEE: Right. Some external changes consistent with osteoarthritis such as synovial bogginess. No effusion. Lacks full extension by about 5. Has full flexion. A lot of crepitus. Ligamentously intact to varus and valgus stress. Normal anterior drawer. Medial joint line tenderness present. The calf is soft. Distally he is neurovascularly intact.  INJECTION: Patient was given informed consent, signed copy in the chart. Appropriate time out was taken. Area prepped and draped in usual sterile fashion. One cc of methylprednisolone 40 mg/ml plus  4 cc of 1% lidocaine without epinephrine was injected into the right knee using a(n) anterior medial approach. The patient tolerated the procedure well. There were no complications. Post procedure instructions were given.        Assessment & Plan:

## 2013-11-26 ENCOUNTER — Encounter: Payer: Self-pay | Admitting: Family Medicine

## 2013-11-26 ENCOUNTER — Ambulatory Visit: Payer: Medicaid Other | Attending: Family Medicine | Admitting: Family Medicine

## 2013-11-26 VITALS — BP 150/91 | HR 87 | Temp 97.8°F | Resp 15 | Wt 162.6 lb

## 2013-11-26 DIAGNOSIS — K432 Incisional hernia without obstruction or gangrene: Secondary | ICD-10-CM

## 2013-11-26 DIAGNOSIS — K625 Hemorrhage of anus and rectum: Secondary | ICD-10-CM

## 2013-11-26 DIAGNOSIS — D126 Benign neoplasm of colon, unspecified: Secondary | ICD-10-CM

## 2013-11-26 DIAGNOSIS — K635 Polyp of colon: Secondary | ICD-10-CM

## 2013-11-26 NOTE — Assessment & Plan Note (Signed)
Will refer for evaluation and possible correction.

## 2013-11-26 NOTE — Progress Notes (Signed)
   Subjective:    Patient ID: Samuel Reyes, male    DOB: 30-Jan-1956, 58 y.o.   MRN: [id number]  HPI  Umbilical hernia Complains the area is painful when he coughs or bends over Has had eight prior surgeries for the same thing This has been bothering him for the past couple of months Has occasional vomiting and blood in his stool for months Hernia has never stuck and not been able to be reduced but sometimes becomes fixed for up to 30 min at at time.  Colon Polyps Had 10 years ago with last colonoscopy  Review of Symptoms - see HPI  PMH - Smoking status noted.     Review of Systems     Objective:   Physical Exam  Alert no acute distress 2 cm incisional hernia at base of large well healed midline vertical scar palpable with valsalva No HSM or tenderness       Assessment & Plan:

## 2013-11-26 NOTE — Assessment & Plan Note (Signed)
History of polyps 10 years ago on colonscpy in [place].  Will refer for follow up

## 2013-11-26 NOTE — Patient Instructions (Signed)
  Go to the ED if hernia is not able to be pushed  Back in or remains out for longer than thirty minutes        Hernia A hernia occurs when an internal organ pushes out through a weak spot in the abdominal wall. Hernias most commonly occur in the groin and around the navel. Hernias often can be pushed back into place (reduced). Most hernias tend to get worse over time. Some abdominal hernias can get stuck in the opening (irreducible or incarcerated hernia) and cannot be reduced. An irreducible abdominal hernia which is tightly squeezed into the opening is at risk for impaired blood supply (strangulated hernia). A strangulated hernia is a medical emergency. Because of the risk for an irreducible or strangulated hernia, surgery may be recommended to repair a hernia. CAUSES   Heavy lifting.  Prolonged coughing.  Straining to have a bowel movement.  A cut (incision) made during an abdominal surgery. HOME CARE INSTRUCTIONS   Bed rest is not required. You may continue your normal activities.  Avoid lifting more than 10 pounds (4.5 kg) or straining.  Cough gently. If you are a smoker it is best to stop. Even the best hernia repair can break down with the continual strain of coughing. Even if you do not have your hernia repaired, a cough will continue to aggravate the problem.  Do not wear anything tight over your hernia. Do not try to keep it in with an outside bandage or truss. These can damage abdominal contents if they are trapped within the hernia sac.  Eat a normal diet.  Avoid constipation. Straining over long periods of time will increase hernia size and encourage breakdown of repairs. If you cannot do this with diet alone, stool softeners may be used. SEEK IMMEDIATE MEDICAL CARE IF:   You have a fever.  You develop increasing abdominal pain.  You feel nauseous or vomit.  Your hernia is stuck outside the abdomen, looks discolored, feels hard, or is tender.  You have any  changes in your bowel habits or in the hernia that are unusual for you.  You have increased pain or swelling around the hernia.  You cannot push the hernia back in place by applying gentle pressure while lying down. MAKE SURE YOU:   Understand these instructions.  Will watch your condition.  Will get help right away if you are not doing well or get worse. Document Released: 07/16/2005 Document Revised: 10/08/2011 Document Reviewed: 03/04/2008 [facility] Patient Information 2014 [place].

## 2013-11-27 ENCOUNTER — Encounter: Payer: Self-pay | Admitting: Gastroenterology

## 2013-12-23 ENCOUNTER — Ambulatory Visit (INDEPENDENT_AMBULATORY_CARE_PROVIDER_SITE_OTHER): Payer: Medicaid Other | Admitting: General Surgery

## 2013-12-23 ENCOUNTER — Encounter (INDEPENDENT_AMBULATORY_CARE_PROVIDER_SITE_OTHER): Payer: Self-pay | Admitting: General Surgery

## 2013-12-23 ENCOUNTER — Telehealth (INDEPENDENT_AMBULATORY_CARE_PROVIDER_SITE_OTHER): Payer: Self-pay | Admitting: General Surgery

## 2013-12-23 VITALS — BP 132/82 | HR 64 | Temp 98.2°F | Resp 18 | Ht 64.0 in | Wt 156.4 lb

## 2013-12-23 DIAGNOSIS — K432 Incisional hernia without obstruction or gangrene: Secondary | ICD-10-CM

## 2013-12-23 NOTE — Telephone Encounter (Signed)
Patient called in to let us know he will be having his colonoscopy done at [facility].

## 2013-12-23 NOTE — Patient Instructions (Signed)
Laparoscopic Ventral Hernia Repair Laparoscopic ventral hernia repairis a surgery to fix a ventral hernia. Aventral hernia, also called an incisional hernia, is a bulge of body tissue or intestines that pushes through the front part of the abdomen. This can happen if the connective tissue covering the muscles over the abdomen has a weak spot or is torn because of a surgical cut (incision) from a previous surgery. Laparoscopic ventral hernia repair is often done soon after diagnosis to stop the hernia from getting bigger, becoming uncomfortable, or becoming an emergency. This surgery usually takes about 2 hours, but the time can vary greatly. LET YOUR CAREGIVER KNOW ABOUT:  Any allergies you have.  All medicines you are taking, including steroids, vitamins, herbs, eyedrops, and over-the-counter medicines and creams.  Previous problems you or members of your family have had with the use of anesthetics.  Any blood disorders or bleeding problems you have had.  Past surgeries you have had.  Other health problems you have. RISKS AND COMPLICATIONS  Generally, laparoscopic ventral hernia repair is a safe procedure. However, as with any surgical procedure, complications can occur. Possible complications include:  Bleeding.  Trouble passing urine or having a bowel movement after the operation.  Infection.  Pneumonia.  Blood clots.  Pain in the area of the hernia.  A bulge in the area of the hernia that may be caused by a collection of fluid.  Injury to intestines or other structures in the abdomen.  Return of the hernia after surgery. In some cases, the caregiver may need to stop the laparoscopic procedure and do regular, open surgery. This may be necessary for very difficult hernias, when organs are hard to see, or when bleeding problems occur during surgery. BEFORE THE PROCEDURE   You may need to have blood tests, urine tests, a chest X-ray, or electrocardiography done before the day  of the surgery.  Ask your caregiver about changing or stopping your regular medicines.  You may need to wash with a special type of germ-killing soap.  Do not eat or drink anything for at least 6 hours before the surgery.  Make plans to have someone drive you home after the procedure. PROCEDURE   Small monitors will be put on your body. They are used to check your heart, blood pressure, and oxygen level.  An intravenous (IV) access tube will be put into a vein in your hand or arm. Fluids and medicine will flow directly into your body through the IV tube.  You will be given medicine to make you sleep through the procedure (general anesthetic).  Your abdomen will be cleaned with a special soap to kill any germs on your skin.  Once you are asleep, several small incisions will be made in your abdomen.  The large space in your abdomen will be filled with air so that it expands. This gives the caregiver more room and a better view.  A thin, lighted tube with a tiny camera on the end (laparoscope) is put through a small incision in your abdomen. The camera on the laparoscope sends a picture to a TV screen in the operating room. This gives the caregiver a good view inside the abdomen.  Hollow tubes are put through the other small incisions in your abdomen. The tools needed for the procedure are put through these tubes.  The caregiver puts the tissue or intestines that formed the hernia back in place.  A screen-like patch (mesh) is used to close the hernia. This helps make   the area stronger. Stitches, tacks, or staples are used to keep the mesh in place.  Medicine and a bandage (dressing) or skin glue will be put over the incisions. AFTER THE PROCEDURE   You will stay in a recovery area until the anesthetic wears off. Your blood pressure and pulse will be checked often.  You may be able to go home the same day or may need to stay in the hospital for 1 or 2 days after this surgery. Your  caregiver will decide when you can go home.  You may feel some pain. You will likely be given medicine for pain.  You will be urged to do breathing exercises that involve taking deep breaths. This helps prevent a lung infection after a surgery.  You may have to wear compression stockings while you are in the hospital. These stockings help keep blood clots from forming in your legs. Document Released: 07/02/2012 Document Reviewed: 07/02/2012 [employer] Patient Information 2014 [employer].  

## 2013-12-23 NOTE — Progress Notes (Signed)
Subjective:   recurrent ventral incisional hernia  Patient ID: Samuel Reyes, male   DOB: 08/11/1955, 58 y.o.   MRN: [id number]  HPI Patient is referred by Dr. Chambless from [facility] symptomatic ventral hernia. The patient has a history of gastrectomy for ulcer disease 40 years ago. He states that since that time he has had multiple incisional hernia repairs, approximately 15 by his estimation. Most of these were many years ago. They were mostly in [place] and some in [place]. The last repair was about 16 years ago. He is not sure that any mesh was ever used. For the last several weeks he has had an intermittent painful bulge near the umbilicus that he has to push back in. No GI symptoms. He is due a colonoscopy in the near future.  Past Medical History  Diagnosis Date  . Cirrhosis   . Degenerative joint disease   . Hypertension   . Gastric ulcer   . Depression   . Asthma   . Mitral valve prolapse 2002  . H/O hiatal hernia   . Hiatal hernia 1982   Past Surgical History  Procedure Laterality Date  . Gastrectomy    . Hernia repair    . Back surgery    . Shoulder surgery    . Rt knee arthroscopic surgery     Current Outpatient Prescriptions  Medication Sig Dispense Refill  . albuterol (PROVENTIL HFA;VENTOLIN HFA) 108 (90 BASE) MCG/ACT inhaler Inhale 2 puffs into the lungs every 6 (six) hours as needed for wheezing or shortness of breath.  1 Inhaler  5  . albuterol-ipratropium (COMBIVENT) 18-103 MCG/ACT inhaler Inhale 2 puffs into the lungs 2 (two) times daily.  1 Inhaler  5  . ARIPiprazole (ABILIFY) 5 MG tablet Take 1 tablet (5 mg total) by mouth daily. For mood stability.  30 tablet  0  . Aspirin-Acetaminophen-Caffeine (GOODYS EXTRA STRENGTH) 500-325-65 MG PACK Take 5 packets by mouth as needed (Use less than current use (5x /day)).      . FLUoxetine (PROZAC) 10 MG capsule Take 1 capsule (10 mg total) by mouth daily. For depression.  30 capsule  0  . gabapentin (NEURONTIN) 300  MG capsule Take 1 capsule (300 mg total) by mouth 2 (two) times daily.  60 capsule  4  . hydrochlorothiazide (HYDRODIURIL) 12.5 MG tablet Take 1 tablet (12.5 mg total) by mouth daily.  30 tablet  4  . metoprolol tartrate (LOPRESSOR) 25 MG tablet Take 1 tablet (25 mg total) by mouth 2 (two) times daily.  60 tablet  3  . mirtazapine (REMERON) 15 MG tablet Take 1 tablet (15 mg total) by mouth at bedtime. For depression and sleep.  30 tablet  0  . naproxen (NAPROSYN) 500 MG tablet Take 1 tablet (500 mg total) by mouth 2 (two) times daily with a meal. For knee pain  60 tablet  4  . nicotine (NICODERM CQ - DOSED IN MG/24 HOURS) 14 mg/24hr patch Place 1 patch onto the skin daily.  28 patch  2  . pantoprazole (PROTONIX) 20 MG tablet Take 1 tablet (20 mg total) by mouth 2 (two) times daily.  60 tablet  4   No current facility-administered medications for this visit.   Allergies  Allergen Reactions  . Bee Venom Anaphylaxis  . Penicillins Rash     Review of Systems  Constitutional: Negative.   Respiratory: Positive for shortness of breath and wheezing. Negative for choking and stridor.   Cardiovascular: Negative.     Gastrointestinal: Negative.   Genitourinary: Negative.   Musculoskeletal: Positive for arthralgias.       Objective:   Physical Exam BP 132/82  Pulse 64  Temp(Src) 98.2 F (36.8 C) (Oral)  Resp 18  Ht 5' 4" (1.626 m)  Wt 156 lb 6.4 oz (70.943 kg)  BMI 26.83 kg/m2 General: Well-developed Caucasian male no distress Skin: Warm and dry without rash or infection. HEENT: No palpable masses or thyromegaly. Sclera nonicteric. Pupils equal round and reactive. Oropharynx clear. Lymph nodes: No cervical, supraclavicular, or inguinal nodes palpable. Lungs: Breath sounds clear and equal without increased work of breathing Cardiovascular: Regular rate and rhythm without murmur. No JVD or edema. Peripheral pulses intact. Abdomen: Nondistended. Soft and nontender. No masses palpable. No  organomegaly. There is an upper midline incision that is well-healed. There is a discrete probably 2-3 cm reducible hernia near the umbilicus. The abdominal wall in general is intact with no other palpable hernias. Extremities: No edema or joint swelling or deformity. No chronic venous stasis changes. Neurologic: Alert and fully oriented. Gait normal.    Assessment:     Multiply recurrent ventral incisional hernia. His last repair however was over 15 years ago not sure that mesh has ever been used. This is symptomatic and will need to be repaired. He has a history of colon polyps and colonoscopy is due. He has an appointment with her GI physician next week and we will wait until his colonoscopy is completed to fix his hernia. I discussed laparoscopic incisional hernia repair in detail including the indications and nature of the procedure, expected recovery, risks of anesthetic complications, cardiorespiratory complications, bleeding, infection, visceral injury, recurrent hernia and open surgery. He was given literature guarding the procedure.     Plan:     Schedule for laparoscopic repair of recurrent incisional hernia under general anesthesia at least overnight hospitalization. We will schedule after his colonoscopy is completed.       

## 2013-12-28 ENCOUNTER — Telehealth: Payer: Self-pay | Admitting: *Deleted

## 2013-12-28 DIAGNOSIS — I10 Essential (primary) hypertension: Secondary | ICD-10-CM

## 2013-12-28 MED ORDER — METOPROLOL TARTRATE 25 MG PO TABS: 25.0000 mg | ORAL_TABLET | Freq: Two times a day (BID) | ORAL | Status: DC

## 2013-12-28 NOTE — Telephone Encounter (Signed)
Patient called in requesting a refill on his HTN medication.

## 2013-12-29 ENCOUNTER — Encounter (INDEPENDENT_AMBULATORY_CARE_PROVIDER_SITE_OTHER): Payer: Self-pay

## 2013-12-29 ENCOUNTER — Encounter: Payer: Self-pay | Admitting: Gastroenterology

## 2013-12-29 ENCOUNTER — Encounter (HOSPITAL_COMMUNITY): Payer: Self-pay | Admitting: Pharmacy Technician

## 2013-12-29 ENCOUNTER — Ambulatory Visit (INDEPENDENT_AMBULATORY_CARE_PROVIDER_SITE_OTHER): Payer: Medicaid Other | Admitting: Gastroenterology

## 2013-12-29 ENCOUNTER — Other Ambulatory Visit: Payer: Self-pay | Admitting: Gastroenterology

## 2013-12-29 VITALS — BP 149/87 | HR 77 | Temp 97.5°F | Resp 20 | Ht 64.0 in | Wt 156.8 lb

## 2013-12-29 DIAGNOSIS — K219 Gastro-esophageal reflux disease without esophagitis: Secondary | ICD-10-CM

## 2013-12-29 DIAGNOSIS — K635 Polyp of colon: Secondary | ICD-10-CM

## 2013-12-29 DIAGNOSIS — Z86010 Personal history of colonic polyps: Secondary | ICD-10-CM

## 2013-12-29 DIAGNOSIS — D126 Benign neoplasm of colon, unspecified: Secondary | ICD-10-CM

## 2013-12-29 MED ORDER — PEG-KCL-NACL-NASULF-NA ASC-C 100 G PO SOLR: 1.0000 | ORAL | Status: DC

## 2013-12-29 MED ORDER — OMEPRAZOLE 20 MG PO CPDR: DELAYED_RELEASE_CAPSULE | ORAL | Status: DC

## 2013-12-29 NOTE — Assessment & Plan Note (Addendum)
SX NOT CONTROLLED. OFF MEDS.  ADD OMEPRAZOLE CONTINUE TO REDUCE CIGS LOW FAT DIET EGD TO SCREEN FOR [facility]'S OPV IN 6 MOS

## 2013-12-29 NOTE — Progress Notes (Signed)
 Subjective:    Patient ID: Samuel Reyes, male    DOB: 02/26/1956, 58 y.o.   MRN: [id number]  ADVANI, DEEPAK, MD  HPI Pt presents with personal history of polyp[s and needs TCS. LAST ONE 5 YRS AGO. STATES HE WAS HAVING ONE EVERY YEAR IN [place]. RETURNED TO [place] BECAUSE HIS WIFE OF 32 YRS PASSED AWAY. NO PROBLEMS WITH SEDATION EASY TO SEDATE. NO EGD SINCE AGE > 50. REDUCING HIS CIGS/DAY: 1/2 PK/DAY. TROUBLE WITH CONSTIPATION 2-3X/WEEK. SEES BRBPR WHEN HE IS CONSTIPATED. HEARTBURN EVERY DAY. TAKES 5 GOODYS & NAPROXEN DAILY FOR R KNEE PAIN. PT DENIES FEVER, CHILLS, nausea, vomiting, melena, DIARRHEA, problems swallowing, OR problems with sedation.   Past Medical History  Diagnosis Date  . Cirrhosis   . Degenerative joint disease   . Hypertension   . Gastric ulcer   . Depression   . Asthma   . Mitral valve prolapse 2002  . H/O hiatal hernia   . Hiatal hernia 1982    Past Surgical History  Procedure Laterality Date  . Gastrectomy    . Hernia repair    . Back surgery    . Shoulder surgery    . Rt knee arthroscopic surgery       Allergies  Allergen Reactions  . Bee Venom Anaphylaxis  . Penicillins Rash   Current Outpatient Prescriptions  Medication Sig Dispense Refill  . albuterol (PROVENTIL HFA;VENTOLIN HFA) 108 (90 BASE) MCG/ACT inhaler Inhale 2 puffs into the lungs every 6 (six) hours as needed for wheezing or shortness of breath.    . albuterol-ipratropium (COMBIVENT) 18-103 MCG/ACT inhaler Inhale 2 puffs into the lungs 2 (two) times daily.    . ARIPiprazole (ABILIFY) 5 MG tablet Take 1 tablet (5 mg total) by mouth daily. For mood stability.    . Aspirin-Acetaminophen-Caffeine (GOODYS EXTRA STRENGTH) 500-325-65 MG PACK Take 5 packets by mouth as needed (Use less than current use (5x /day)).    .      . FLUoxetine (PROZAC) 10 MG capsule Take 1 capsule (10 mg total) by mouth daily. For depression.    . hydrochlorothiazide (HYDRODIURIL) 12.5 MG tablet Take 1 tablet (12.5 mg  total) by mouth daily.    . metoprolol tartrate (LOPRESSOR) 25 MG tablet Take 1 tablet (25 mg total) by mouth 2 (two) times daily.    . mirtazapine (REMERON) 15 MG tablet Take 1 tablet (15 mg total) by mouth at bedtime. For depression and sleep.    . naproxen (NAPROSYN) 500 MG tablet Take 1 tablet (500 mg total) by mouth 2 (two) times daily with a meal. For knee pain    . nicotine (NICODERM CQ - DOSED IN MG/24 HOURS) 14 mg/24hr patch Place 1 patch onto the skin daily.    . gabapentin (NEURONTIN) 300 MG capsule Take 1 capsule (300 mg total) by mouth 2 (two) times daily.       Review of Systems PER HPI OTHERWISE ALL SYSTEMS ARE NEGATIVE.      Objective:   Physical Exam  Vitals reviewed. Constitutional: He is oriented to person, place, and time. He appears well-nourished. No distress.  HENT:  Head: Normocephalic and atraumatic.  Mouth/Throat: Oropharynx is clear and moist. No oropharyngeal exudate.  Eyes: Pupils are equal, round, and reactive to light. No scleral icterus.  Neck: Normal range of motion. Neck supple.  Cardiovascular: Normal rate, regular rhythm and normal heart sounds.   Pulmonary/Chest: Effort normal and breath sounds normal. No respiratory distress.  Abdominal: Soft. Bowel sounds are   normal. He exhibits no distension. There is no tenderness.  HERNIA PRESENT.  Musculoskeletal: He exhibits edema.  TRACE BIL LE  Lymphadenopathy:    He has no cervical adenopathy.  Neurological: He is alert and oriented to person, place, and time.  NO FOCAL DEFICITS   Psychiatric:  FLAT AFFECT, NL MOOD           Assessment & Plan:   

## 2013-12-29 NOTE — Assessment & Plan Note (Signed)
TCS OVERDUE  EAT FIBER TCS TO SCREEN FOR POLYPS  OPV IN 6 MOS

## 2013-12-29 NOTE — Progress Notes (Signed)
Reminder in epic °

## 2013-12-29 NOTE — Patient Instructions (Addendum)
CUT BACK ON USING ASPIRIN, BC/GOODY POWDERS, IBUPROFEN/MOTRIN, OR NAPROXEN/ALEVE BECAUSE YOU HAVE A HISTORY OF STOMACH SURGERY/ULCERS.  ADD OMEPRAZOLE.  TAKE TWICE DAILY 30 MINUTES PRIOR TO YOUR MEAL  DRINK WATER TO KEEP YOUR URINE LIGHT YELLOW.  FOLLOW A HIGH FIBER/LOW FAT DIET. SEE INFO BELOW.  ENDOSCOPY Monday TO SCREEN FOR BARRETT'S AND POLYPS. YOU MAY HAVE A FULL LIQUID DIET JUN 7 AND DON'T EAT OR DRINK ANYTHING AFTER 8 AM MON JUN 8.  FOLLOW UP IN 6 MOS.    High-Fiber Diet A high-fiber diet changes your normal diet to include more whole grains, legumes, fruits, and vegetables. Changes in the diet involve replacing refined carbohydrates with unrefined foods. The calorie level of the diet is essentially unchanged. The Dietary Reference Intake (recommended amount) for adult males is 38 grams per day. For adult females, it is 25 grams per day. Pregnant and lactating women should consume 28 grams of fiber per day. Fiber is the intact part of a plant that is not broken down during digestion. Functional fiber is fiber that has been isolated from the plant to provide a beneficial effect in the body. PURPOSE  Increase stool bulk.   Ease and regulate bowel movements.   Lower cholesterol.  INDICATIONS THAT YOU NEED MORE FIBER  Constipation and hemorrhoids.   Uncomplicated diverticulosis (intestine condition) and irritable bowel syndrome.   Weight management.   As a protective measure against hardening of the arteries (atherosclerosis), diabetes, and cancer.   GUIDELINES FOR INCREASING FIBER IN THE DIET  Start adding fiber to the diet slowly. A gradual increase of about 5 more grams (2 slices of whole-wheat bread, 2 servings of most fruits or vegetables, or 1 bowl of high-fiber cereal) per day is best. Too rapid an increase in fiber may result in constipation, flatulence, and bloating.   Drink enough water and fluids to keep your urine clear or pale yellow. Water, juice, or  caffeine-free drinks are recommended. Not drinking enough fluid may cause constipation.   Eat a variety of high-fiber foods rather than one type of fiber.   Try to increase your intake of fiber through using high-fiber foods rather than fiber pills or supplements that contain small amounts of fiber.   The goal is to change the types of food eaten. Do not supplement your present diet with high-fiber foods, but replace foods in your present diet.  INCLUDE A VARIETY OF FIBER SOURCES  Replace refined and processed grains with whole grains, canned fruits with fresh fruits, and incorporate other fiber sources. White rice, white breads, and most bakery goods contain little or no fiber.   Brown whole-grain rice, buckwheat oats, and many fruits and vegetables are all good sources of fiber. These include: broccoli, Brussels sprouts, cabbage, cauliflower, beets, sweet potatoes, white potatoes (skin on), carrots, tomatoes, eggplant, squash, berries, fresh fruits, and dried fruits.   Cereals appear to be the richest source of fiber. Cereal fiber is found in whole grains and bran. Bran is the fiber-rich outer coat of cereal grain, which is largely removed in refining. In whole-grain cereals, the bran remains. In breakfast cereals, the largest amount of fiber is found in those with "bran" in their names. The fiber content is sometimes indicated on the label.   You may need to include additional fruits and vegetables each day.   In baking, for 1 cup white flour, you may use the following substitutions:   1 cup whole-wheat flour minus 2 tablespoons.   1/2 cup white flour  plus 1/2 cup whole-wheat flour.   Low-Fat Diet BREADS, CEREALS, PASTA, RICE, DRIED PEAS, AND BEANS These products are high in carbohydrates and most are low in fat. Therefore, they can be increased in the diet as substitutes for fatty foods. They too, however, contain calories and should not be eaten in excess. Cereals can be eaten for  snacks as well as for breakfast.   FRUITS AND VEGETABLES It is good to eat fruits and vegetables. Besides being sources of fiber, both are rich in vitamins and some minerals. They help you get the daily allowances of these nutrients. Fruits and vegetables can be used for snacks and desserts.  MEATS Limit lean meat, chicken, [place], and fish to no more than 6 ounces per day. Beef, Pork, and Lamb Use lean cuts of beef, pork, and lamb. Lean cuts include:  Extra-lean ground beef.  Arm roast.  Sirloin tip.  Center-cut ham.  Round steak.  Loin chops.  Rump roast.  Tenderloin.  Trim all fat off the outside of meats before cooking. It is not necessary to severely decrease the intake of red meat, but lean choices should be made. Lean meat is rich in protein and contains a highly absorbable form of iron. Premenopausal women, in particular, should avoid reducing lean red meat because this could increase the risk for low red blood cells (iron-deficiency anemia).  Chicken and [place] These are good sources of protein. The fat of poultry can be reduced by removing the skin and underlying fat layers before cooking. Chicken and [place] can be substituted for lean red meat in the diet. Poultry should not be fried or covered with high-fat sauces. Fish and Shellfish Fish is a good source of protein. Shellfish contain cholesterol, but they usually are low in saturated fatty acids. The preparation of fish is important. Like chicken and [place], they should not be fried or covered with high-fat sauces. EGGS Egg whites contain no fat or cholesterol. They can be eaten often. Try 1 to 2 egg whites instead of whole eggs in recipes or use egg substitutes that do not contain yolk. MILK AND DAIRY PRODUCTS Use skim or 1% milk instead of 2% or whole milk. Decrease whole milk, natural, and processed cheeses. Use nonfat or low-fat (2%) cottage cheese or low-fat cheeses made from vegetable oils. Choose nonfat or low-fat (1 to  2%) yogurt. Experiment with evaporated skim milk in recipes that call for heavy cream. Substitute low-fat yogurt or low-fat cottage cheese for sour cream in dips and salad dressings. Have at least 2 servings of low-fat dairy products, such as 2 glasses of skim (or 1%) milk each day to help get your daily calcium intake. FATS AND OILS Reduce the total intake of fats, especially saturated fat. Butterfat, lard, and beef fats are high in saturated fat and cholesterol. These should be avoided as much as possible. Vegetable fats do not contain cholesterol, but certain vegetable fats, such as coconut oil, palm oil, and palm kernel oil are very high in saturated fats. These should be limited. These fats are often used in bakery goods, processed foods, popcorn, oils, and nondairy creamers. Vegetable shortenings and some peanut butters contain hydrogenated oils, which are also saturated fats. Read the labels on these foods and check for saturated vegetable oils. Unsaturated vegetable oils and fats do not raise blood cholesterol. However, they should be limited because they are fats and are high in calories. Total fat should still be limited to 30% of your daily caloric intake. Desirable  liquid vegetable oils are corn oil, cottonseed oil, olive oil, canola oil, safflower oil, soybean oil, and sunflower oil. Peanut oil is not as good, but small amounts are acceptable. Buy a heart-healthy tub margarine that has no partially hydrogenated oils in the ingredients. Mayonnaise and salad dressings often are made from unsaturated fats, but they should also be limited because of their high calorie and fat content. Seeds, nuts, peanut butter, olives, and avocados are high in fat, but the fat is mainly the unsaturated type. These foods should be limited mainly to avoid excess calories and fat. OTHER EATING TIPS Snacks  Most sweets should be limited as snacks. They tend to be rich in calories and fats, and their caloric content  outweighs their nutritional value. Some good choices in snacks are graham crackers, melba toast, soda crackers, bagels (no egg), English muffins, fruits, and vegetables. These snacks are preferable to snack crackers, [place] fries, TORTILLA CHIPS, and POTATO chips. Popcorn should be air-popped or cooked in small amounts of liquid vegetable oil. Desserts Eat fruit, low-fat yogurt, and fruit ices instead of pastries, cake, and cookies. Sherbet, angel food cake, gelatin dessert, frozen low-fat yogurt, or other frozen products that do not contain saturated fat (pure fruit juice bars, frozen ice pops) are also acceptable.  COOKING METHODS Choose those methods that use little or no fat. They include: Poaching.  Braising.  Steaming.  Grilling.  Baking.  Stir-frying.  Broiling.  Microwaving.  Foods can be cooked in a nonstick pan without added fat, or use a nonfat cooking spray in regular cookware. Limit fried foods and avoid frying in saturated fat. Add moisture to lean meats by using water, broth, cooking wines, and other nonfat or low-fat sauces along with the cooking methods mentioned above. Soups and stews should be chilled after cooking. The fat that forms on top after a few hours in the refrigerator should be skimmed off. When preparing meals, avoid using excess salt. Salt can contribute to raising blood pressure in some people.  EATING AWAY FROM HOME Order entres, potatoes, and vegetables without sauces or butter. When meat exceeds the size of a deck of cards (3 to 4 ounces), the rest can be taken home for another meal. Choose vegetable or fruit salads and ask for low-calorie salad dressings to be served on the side. Use dressings sparingly. Limit high-fat toppings, such as bacon, crumbled eggs, cheese, sunflower seeds, and olives. Ask for heart-healthy tub margarine instead of butter.

## 2013-12-30 NOTE — Progress Notes (Signed)
cc'd to pcp 

## 2014-01-04 ENCOUNTER — Encounter (HOSPITAL_COMMUNITY)
Admission: RE | Disposition: A | Discharge status: Home / Self Care | Payer: Self-pay | Source: Ambulatory Visit | Attending: Gastroenterology

## 2014-01-04 ENCOUNTER — Ambulatory Visit (HOSPITAL_COMMUNITY)
Admission: RE | Admit: 2014-01-04 | Discharge: 2014-01-04 | Disposition: A | Discharge status: Home / Self Care | Payer: Medicaid Other | Source: Ambulatory Visit | Attending: Gastroenterology | Admitting: Gastroenterology

## 2014-01-04 ENCOUNTER — Encounter (HOSPITAL_COMMUNITY): Payer: Self-pay | Admitting: *Deleted

## 2014-01-04 DIAGNOSIS — I10 Essential (primary) hypertension: Secondary | ICD-10-CM | POA: Insufficient documentation

## 2014-01-04 DIAGNOSIS — K7460 Unspecified cirrhosis of liver: Secondary | ICD-10-CM | POA: Insufficient documentation

## 2014-01-04 DIAGNOSIS — K449 Diaphragmatic hernia without obstruction or gangrene: Secondary | ICD-10-CM | POA: Insufficient documentation

## 2014-01-04 DIAGNOSIS — F329 Major depressive disorder, single episode, unspecified: Secondary | ICD-10-CM | POA: Insufficient documentation

## 2014-01-04 DIAGNOSIS — K648 Other hemorrhoids: Secondary | ICD-10-CM | POA: Insufficient documentation

## 2014-01-04 DIAGNOSIS — D126 Benign neoplasm of colon, unspecified: Secondary | ICD-10-CM | POA: Insufficient documentation

## 2014-01-04 DIAGNOSIS — F17200 Nicotine dependence, unspecified, uncomplicated: Secondary | ICD-10-CM | POA: Insufficient documentation

## 2014-01-04 DIAGNOSIS — Z860100 Personal history of colon polyps, unspecified: Secondary | ICD-10-CM | POA: Insufficient documentation

## 2014-01-04 DIAGNOSIS — K2990 Gastroduodenitis, unspecified, without bleeding: Secondary | ICD-10-CM

## 2014-01-04 DIAGNOSIS — Z91038 Other insect allergy status: Secondary | ICD-10-CM | POA: Insufficient documentation

## 2014-01-04 DIAGNOSIS — Z79899 Other long term (current) drug therapy: Secondary | ICD-10-CM | POA: Insufficient documentation

## 2014-01-04 DIAGNOSIS — K2970 Gastritis, unspecified, without bleeding: Secondary | ICD-10-CM | POA: Insufficient documentation

## 2014-01-04 DIAGNOSIS — M1990 Unspecified osteoarthritis, unspecified site: Secondary | ICD-10-CM | POA: Insufficient documentation

## 2014-01-04 DIAGNOSIS — Z880 Allergy status to penicillin: Secondary | ICD-10-CM | POA: Insufficient documentation

## 2014-01-04 DIAGNOSIS — K219 Gastro-esophageal reflux disease without esophagitis: Secondary | ICD-10-CM | POA: Insufficient documentation

## 2014-01-04 DIAGNOSIS — Z7982 Long term (current) use of aspirin: Secondary | ICD-10-CM | POA: Insufficient documentation

## 2014-01-04 DIAGNOSIS — J45909 Unspecified asthma, uncomplicated: Secondary | ICD-10-CM | POA: Insufficient documentation

## 2014-01-04 DIAGNOSIS — F3289 Other specified depressive episodes: Secondary | ICD-10-CM | POA: Insufficient documentation

## 2014-01-04 HISTORY — PX: ESOPHAGOGASTRODUODENOSCOPY: SHX5428

## 2014-01-04 HISTORY — DX: Chronic obstructive pulmonary disease, unspecified: J44.9

## 2014-01-04 HISTORY — PX: COLONOSCOPY: SHX5424

## 2014-01-04 SURGERY — COLONOSCOPY
Anesthesia: Moderate Sedation

## 2014-01-04 MED ORDER — LIDOCAINE VISCOUS 2 % MT SOLN
OROMUCOSAL | Status: AC
  Filled 2014-01-04: qty 15

## 2014-01-04 MED ORDER — MIDAZOLAM HCL 5 MG/5ML IJ SOLN
INTRAMUSCULAR | Status: DC | PRN
  Administered 2014-01-04: 1 mg via INTRAVENOUS
  Administered 2014-01-04: 2 mg via INTRAVENOUS
  Administered 2014-01-04: 1 mg via INTRAVENOUS
  Administered 2014-01-04 (×2): 2 mg via INTRAVENOUS

## 2014-01-04 MED ORDER — MEPERIDINE HCL 100 MG/ML IJ SOLN
INTRAMUSCULAR | Status: DC | PRN
  Administered 2014-01-04 (×4): 25 mg via INTRAVENOUS

## 2014-01-04 MED ORDER — SODIUM CHLORIDE 0.9 % IV SOLN
INTRAVENOUS | Status: DC
  Administered 2014-01-04: 14:00:00 via INTRAVENOUS

## 2014-01-04 MED ORDER — PROMETHAZINE HCL 25 MG/ML IJ SOLN
INTRAMUSCULAR | Status: AC
  Filled 2014-01-04: qty 1

## 2014-01-04 MED ORDER — MEPERIDINE HCL 100 MG/ML IJ SOLN
INTRAMUSCULAR | Status: AC
  Filled 2014-01-04: qty 2

## 2014-01-04 MED ORDER — SODIUM CHLORIDE 0.9 % IJ SOLN
INTRAMUSCULAR | Status: AC
  Filled 2014-01-04: qty 10

## 2014-01-04 MED ORDER — MIDAZOLAM HCL 5 MG/5ML IJ SOLN
INTRAMUSCULAR | Status: AC
  Filled 2014-01-04: qty 10

## 2014-01-04 MED ORDER — PROMETHAZINE HCL 25 MG/ML IJ SOLN
12.5000 mg | Freq: Once | INTRAMUSCULAR | Status: AC
  Administered 2014-01-04: 12.5 mg via INTRAVENOUS

## 2014-01-04 MED ORDER — LIDOCAINE VISCOUS 2 % MT SOLN
OROMUCOSAL | Status: DC | PRN
  Administered 2014-01-04: 1 via OROMUCOSAL

## 2014-01-04 MED ORDER — SIMETHICONE 40 MG/0.6ML PO SUSP
ORAL | Status: DC | PRN
  Administered 2014-01-04: 15:00:00

## 2014-01-04 NOTE — H&P (View-Only) (Signed)
Subjective:    Patient ID: Samuel Reyes, male    DOB: 20-Jun-1956, 58 y.o.   MRN: [id number]  Lorayne Marek, MD  HPI Pt presents with personal history of polyp[s and needs TCS. LAST ONE 5 YRS AGO. STATES HE WAS HAVING ONE EVERY YEAR IN [place]. RETURNED TO [place] BECAUSE HIS WIFE OF 32 YRS PASSED AWAY. NO PROBLEMS WITH SEDATION EASY TO SEDATE. NO EGD SINCE AGE > 49. REDUCING HIS CIGS/DAY: 1/2 PK/DAY. TROUBLE WITH CONSTIPATION 2-3X/WEEK. SEES BRBPR WHEN HE IS CONSTIPATED. HEARTBURN EVERY DAY. TAKES 5 GOODYS & NAPROXEN DAILY FOR R KNEE PAIN. PT DENIES FEVER, CHILLS, nausea, vomiting, melena, DIARRHEA, problems swallowing, OR problems with sedation.   Past Medical History  Diagnosis Date  . Cirrhosis   . Degenerative joint disease   . Hypertension   . Gastric ulcer   . Depression   . Asthma   . Mitral valve prolapse 2002  . H/O hiatal hernia   . Hiatal hernia 1982    Past Surgical History  Procedure Laterality Date  . Gastrectomy    . Hernia repair    . Back surgery    . Shoulder surgery    . Rt knee arthroscopic surgery       Allergies  Allergen Reactions  . Bee Venom Anaphylaxis  . Penicillins Rash   Current Outpatient Prescriptions  Medication Sig Dispense Refill  . albuterol (PROVENTIL HFA;VENTOLIN HFA) 108 (90 BASE) MCG/ACT inhaler Inhale 2 puffs into the lungs every 6 (six) hours as needed for wheezing or shortness of breath.    Marland Kitchen albuterol-ipratropium (COMBIVENT) 18-103 MCG/ACT inhaler Inhale 2 puffs into the lungs 2 (two) times daily.    . ARIPiprazole (ABILIFY) 5 MG tablet Take 1 tablet (5 mg total) by mouth daily. For mood stability.    . Aspirin-Acetaminophen-Caffeine (GOODYS EXTRA STRENGTH) [phone] MG PACK Take 5 packets by mouth as needed (Use less than current use (5x /day)).    .      . FLUoxetine (PROZAC) 10 MG capsule Take 1 capsule (10 mg total) by mouth daily. For depression.    . hydrochlorothiazide (HYDRODIURIL) 12.5 MG tablet Take 1 tablet (12.5 mg  total) by mouth daily.    . metoprolol tartrate (LOPRESSOR) 25 MG tablet Take 1 tablet (25 mg total) by mouth 2 (two) times daily.    . mirtazapine (REMERON) 15 MG tablet Take 1 tablet (15 mg total) by mouth at bedtime. For depression and sleep.    . naproxen (NAPROSYN) 500 MG tablet Take 1 tablet (500 mg total) by mouth 2 (two) times daily with a meal. For knee pain    . nicotine (NICODERM CQ - DOSED IN MG/24 HOURS) 14 mg/24hr patch Place 1 patch onto the skin daily.    Marland Kitchen gabapentin (NEURONTIN) 300 MG capsule Take 1 capsule (300 mg total) by mouth 2 (two) times daily.       Review of Systems PER HPI OTHERWISE ALL SYSTEMS ARE NEGATIVE.      Objective:   Physical Exam  Vitals reviewed. Constitutional: He is oriented to person, place, and time. He appears well-nourished. No distress.  HENT:  Head: Normocephalic and atraumatic.  Mouth/Throat: Oropharynx is clear and moist. No oropharyngeal exudate.  Eyes: Pupils are equal, round, and reactive to light. No scleral icterus.  Neck: Normal range of motion. Neck supple.  Cardiovascular: Normal rate, regular rhythm and normal heart sounds.   Pulmonary/Chest: Effort normal and breath sounds normal. No respiratory distress.  Abdominal: Soft. Bowel sounds are  normal. He exhibits no distension. There is no tenderness.  HERNIA PRESENT.  Musculoskeletal: He exhibits edema.  TRACE BIL LE  Lymphadenopathy:    He has no cervical adenopathy.  Neurological: He is alert and oriented to person, place, and time.  NO FOCAL DEFICITS   Psychiatric:  FLAT AFFECT, NL MOOD           Assessment & Plan:

## 2014-01-04 NOTE — Op Note (Signed)
[facility] [address] [place], [address]   ENDOSCOPY PROCEDURE REPORT  PATIENT: Samuel Reyes, Samuel Reyes  MR#: [id number] BIRTHDATE: 04-Mar-1956 , 24  yrs. old GENDER: Male  ENDOSCOPIST: Barney Drain, MD REFERRED BY:   Donald Siva, MD PROCEDURE DATE: 01/04/2014 PROCEDURE:   EGD w/ biopsy INDICATIONS:Barrett's screening. MEDICATIONS: TCS+ Demerol 25 mg IV and Versed 2 mg IV TOPICAL ANESTHETIC:   Viscous Xylocaine  DESCRIPTION OF PROCEDURE:     Physical exam was performed.  Informed consent was obtained from the patient after explaining the benefits, risks, and alternatives to the procedure.  The patient was connected to the monitor and placed in the left lateral position.  Continuous oxygen was provided by nasal cannula and IV medicine administered through an indwelling cannula.  After administration of sedation, the patients esophagus was intubated and the [id number] ([id number])  endoscope was advanced under direct visualization to the second portion of the duodenum.  The scope was removed slowly by carefully examining the color, texture, anatomy, and integrity of the mucosa on the way out.  The patient was recovered in endoscopy and discharged home in satisfactory condition.   ESOPHAGUS: 2 CM TONGUE EXTENDING PROXIMAL TO GE JUNCTION.  BIOPSIES OBTAINED VIA COLD FORCEPS.   A small hiatal hernia was noted. STOMACH: Moderate non-erosive gastritis (inflammation) was found in the gastric body.  Multiple biopsies were performed using cold forceps.   DUODENUM: NORMAL ANASTOMOSIS-BILROTH I ANATOMY.  NORMAL SMALL BOWEL. COMPLICATIONS:   None  ENDOSCOPIC IMPRESSION: 1.   PROBABLE BARRETT'S ESOPHAGUS 2.   Small hiatal hernia 3.   MODERATE Non-erosive gastritis 4.   NORMAL ANASTOMOSIS-BILROTH I ANATOMY. RECOMMENDATIONS: CONTINUE OMEPRAZOLE.  TAKE 30 MINUTES PRIOR TO YOUR MEALS TWICE DAILY. AVOID ITEMS THAT TRIGGER GASTRITIS.  SEE INFO BELOW FOLLOW A LOW FAT/HIGH FIBER  DIET.  AVOID ITEMS THAT CAUSE BLOATING.  BIOPSY WILL BE BACK IN 7 DAYS. FOLLOW UP IN December 2015. REPEAT EGD IN 1 YEARS IF PT HAS BARRETT;S ESOPHAGUS. REPEAT COLONOSCOPY IN 3-5 YEARS DUE TO PERSONAL HISTORY OF POLYPS.   REPEAT EXAM:   _______________________________ Lorrin MaisBarney Drain, MD 01/04/2014 4:09 PM

## 2014-01-04 NOTE — Interval H&P Note (Signed)
History and Physical Interval Note:  01/04/2014 2:12 PM  Samuel Reyes  has presented today for surgery, with the diagnosis of COLON POLYPS AND SCREEN FOR BARRETTS ESOPHAGUS  The various methods of treatment have been discussed with the patient and family. After consideration of risks, benefits and other options for treatment, the patient has consented to  Procedure(s) with comments: COLONOSCOPY (N/A) - 1:45 ESOPHAGOGASTRODUODENOSCOPY (EGD) (N/A) as a surgical intervention .  The patient's history has been reviewed, patient examined, no change in status, stable for surgery.  I have reviewed the patient's chart and labs.  Questions were answered to the patient's satisfaction.     Davelle Anselmi L Abdelrahman Nair

## 2014-01-04 NOTE — Discharge Instructions (Signed)
YOU HAD 3  POLYPS REMOVED. You have internal hemorrhoids. YOU HAVE A SMALL HIATAL HERNIA & GASTRITIS.  I BIOPSIED YOUR STOMACH & ESOPHAGUS.     CONTINUE OMEPRAZOLE.  TAKE 30 MINUTES PRIOR TO YOUR MEALS TWICE DAILY.  AVOID ITEMS THAT TRIGGER GASTRITIS. SEE INFO BELOW  FOLLOW A LOW FAT/HIGH FIBER DIET. AVOID ITEMS THAT CAUSE BLOATING. SEE INFO BELOW.  YOUR BIOPSY WILL BE BACK IN 7 DAYS.  FOLLOW UP IN December 2015.  REPEAT COLONOSCOPY IN 3-5 YEARS DUE TO PERSONAL HISTORY OF POLYPS.   ENDOSCOPY Care After Read the instructions outlined below and refer to this sheet in the next week. These discharge instructions provide you with general information on caring for yourself after you leave the hospital. While your treatment has been planned according to the most current medical practices available, unavoidable complications occasionally occur. If you have any problems or questions after discharge, call DR. Adriona Kaney, [phone].  ACTIVITY  You may resume your regular activity, but move at a slower pace for the next 24 hours.   Take frequent rest periods for the next 24 hours.   Walking will help get rid of the air and reduce the bloated feeling in your belly (abdomen).   No driving for 24 hours (because of the medicine (anesthesia) used during the test).   You may shower.   Do not sign any important legal documents or operate any machinery for 24 hours (because of the anesthesia used during the test).    NUTRITION  Drink plenty of fluids.   You may resume your normal diet as instructed by your doctor.   Begin with a light meal and progress to your normal diet. Heavy or fried foods are harder to digest and may make you feel sick to your stomach (nauseated).   Avoid alcoholic beverages for 24 hours or as instructed.    MEDICATIONS  You may resume your normal medications.   WHAT YOU CAN EXPECT TODAY  Some feelings of bloating in the abdomen.   Passage of more gas than  usual.   Spotting of blood in your stool or on the toilet paper  .  IF YOU HAD POLYPS REMOVED DURING THE ENDOSCOPY:  Eat a soft diet IF YOU HAVE NAUSEA, BLOATING, ABDOMINAL PAIN, OR VOMITING.    FINDING OUT THE RESULTS OF YOUR TEST Not all test results are available during your visit. DR. Oneida Alar WILL CALL YOU WITHIN 7 DAYS OF YOUR PROCEDUE WITH YOUR RESULTS. Do not assume everything is normal if you have not heard from DR. Manley Fason IN ONE WEEK, CALL HER OFFICE AT [phone].  SEEK IMMEDIATE MEDICAL ATTENTION AND CALL THE OFFICE: [phone] IF:  You have more than a spotting of blood in your stool.   Your belly is swollen (abdominal distention).   You are nauseated or vomiting.   You have a temperature over 101F.   You have abdominal pain or discomfort that is severe or gets worse throughout the day.   Gastritis  Gastritis is an inflammation (the body's way of reacting to injury and/or infection) of the stomach. It is often caused by bacterial (germ) infections. It can also be caused BY ASPIRIN, BC/GOODY POWDER'S, (IBUPROFEN) MOTRIN, OR ALEVE (NAPROXEN), chemicals (including alcohol), SPICY FOODS, and medications. This illness may be associated with generalized malaise (feeling tired, not well), UPPER ABDOMINAL STOMACH cramps, and fever. One common bacterial cause of gastritis is an organism known as H. Pylori. This can be treated with antibiotics.    High-Fiber Diet  A high-fiber diet changes your normal diet to include more whole grains, legumes, fruits, and vegetables. Changes in the diet involve replacing refined carbohydrates with unrefined foods. The calorie level of the diet is essentially unchanged. The Dietary Reference Intake (recommended amount) for adult males is 38 grams per day. For adult females, it is 25 grams per day. Pregnant and lactating women should consume 28 grams of fiber per day. Fiber is the intact part of a plant that is not broken down during digestion.  Functional fiber is fiber that has been isolated from the plant to provide a beneficial effect in the body. PURPOSE  Increase stool bulk.   Ease and regulate bowel movements.   Lower cholesterol.  INDICATIONS THAT YOU NEED MORE FIBER  Constipation and hemorrhoids.   Uncomplicated diverticulosis (intestine condition) and irritable bowel syndrome.   Weight management.   As a protective measure against hardening of the arteries (atherosclerosis), diabetes, and cancer.   DO NOT USE WITH:  Acute diverticulitis (intestine infection).   Partial small bowel obstructions.   Complicated diverticular disease involving bleeding, rupture (perforation), or abscess (boil, furuncle).   Presence of autonomic neuropathy (nerve damage) or gastroparesis (stomach cannot empty itself).    GUIDELINES FOR INCREASING FIBER IN THE DIET  Start adding fiber to the diet slowly. A gradual increase of about 5 more grams (2 slices of whole-wheat bread, 2 servings of most fruits or vegetables, or 1 bowl of high-fiber cereal) per day is best. Too rapid an increase in fiber may result in constipation, flatulence, and bloating.   Drink enough water and fluids to keep your urine clear or pale yellow. Water, juice, or caffeine-free drinks are recommended. Not drinking enough fluid may cause constipation.   Eat a variety of high-fiber foods rather than one type of fiber.   Try to increase your intake of fiber through using high-fiber foods rather than fiber pills or supplements that contain small amounts of fiber.   The goal is to change the types of food eaten. Do not supplement your present diet with high-fiber foods, but replace foods in your present diet.    INCLUDE A VARIETY OF FIBER SOURCES  Replace refined and processed grains with whole grains, canned fruits with fresh fruits, and incorporate other fiber sources. White rice, white breads, and most bakery goods contain little or no fiber.   Brown  whole-grain rice, buckwheat oats, and many fruits and vegetables are all good sources of fiber. These include: broccoli, Brussels sprouts, cabbage, cauliflower, beets, sweet potatoes, white potatoes (skin on), carrots, tomatoes, eggplant, squash, berries, fresh fruits, and dried fruits.   Cereals appear to be the richest source of fiber. Cereal fiber is found in whole grains and bran. Bran is the fiber-rich outer coat of cereal grain, which is largely removed in refining. In whole-grain cereals, the bran remains. In breakfast cereals, the largest amount of fiber is found in those with "bran" in their names. The fiber content is sometimes indicated on the label.   You may need to include additional fruits and vegetables each day.   In baking, for 1 cup white flour, you may use the following substitutions:   1 cup whole-wheat flour minus 2 tablespoons.   1/2 cup white flour plus 1/2 cup whole-wheat flour.   Low-Fat Diet BREADS, CEREALS, PASTA, RICE, DRIED PEAS, AND BEANS These products are high in carbohydrates and most are low in fat. Therefore, they can be increased in the diet as substitutes for fatty foods. They  too, however, contain calories and should not be eaten in excess. Cereals can be eaten for snacks as well as for breakfast.  Include foods that contain fiber (fruits, vegetables, whole grains, and legumes). Research shows that fiber may lower blood cholesterol levels, especially the water-soluble fiber found in fruits, vegetables, oat products, and legumes. FRUITS AND VEGETABLES It is good to eat fruits and vegetables. Besides being sources of fiber, both are rich in vitamins and some minerals. They help you get the daily allowances of these nutrients. Fruits and vegetables can be used for snacks and desserts. MEATS Limit lean meat, chicken, [place], and fish to no more than 6 ounces per day. Beef, Pork, and Lamb Use lean cuts of beef, pork, and lamb. Lean cuts include:  Extra-lean  ground beef.  Arm roast.  Sirloin tip.  Center-cut ham.  Round steak.  Loin chops.  Rump roast.  Tenderloin.  Trim all fat off the outside of meats before cooking. It is not necessary to severely decrease the intake of red meat, but lean choices should be made. Lean meat is rich in protein and contains a highly absorbable form of iron. Premenopausal women, in particular, should avoid reducing lean red meat because this could increase the risk for low red blood cells (iron-deficiency anemia). The organ meats, such as liver, sweetbreads, kidneys, and brain are very rich in cholesterol. They should be limited. Chicken and [place] These are good sources of protein. The fat of poultry can be reduced by removing the skin and underlying fat layers before cooking. Chicken and [place] can be substituted for lean red meat in the diet. Poultry should not be fried or covered with high-fat sauces. Fish and Shellfish Fish is a good source of protein. Shellfish contain cholesterol, but they usually are low in saturated fatty acids. The preparation of fish is important. Like chicken and [place], they should not be fried or covered with high-fat sauces. EGGS Egg whites contain no fat or cholesterol. They can be eaten often. Try 1 to 2 egg whites instead of whole eggs in recipes or use egg substitutes that do not contain yolk. MILK AND DAIRY PRODUCTS Use skim or 1% milk instead of 2% or whole milk. Decrease whole milk, natural, and processed cheeses. Use nonfat or low-fat (2%) cottage cheese or low-fat cheeses made from vegetable oils. Choose nonfat or low-fat (1 to 2%) yogurt. Experiment with evaporated skim milk in recipes that call for heavy cream. Substitute low-fat yogurt or low-fat cottage cheese for sour cream in dips and salad dressings. Have at least 2 servings of low-fat dairy products, such as 2 glasses of skim (or 1%) milk each day to help get your daily calcium intake.  FATS AND OILS Reduce the total  intake of fats, especially saturated fat. Butterfat, lard, and beef fats are high in saturated fat and cholesterol. These should be avoided as much as possible. Vegetable fats do not contain cholesterol, but certain vegetable fats, such as coconut oil, palm oil, and palm kernel oil are very high in saturated fats. These should be limited. These fats are often used in bakery goods, processed foods, popcorn, oils, and nondairy creamers. Vegetable shortenings and some peanut butters contain hydrogenated oils, which are also saturated fats. Read the labels on these foods and check for saturated vegetable oils. Unsaturated vegetable oils and fats do not raise blood cholesterol. However, they should be limited because they are fats and are high in calories. Total fat should still be limited to 30%  of your daily caloric intake. Desirable liquid vegetable oils are corn oil, cottonseed oil, olive oil, canola oil, safflower oil, soybean oil, and sunflower oil. Peanut oil is not as good, but small amounts are acceptable. Buy a heart-healthy tub margarine that has no partially hydrogenated oils in the ingredients. Mayonnaise and salad dressings often are made from unsaturated fats, but they should also be limited because of their high calorie and fat content. Seeds, nuts, peanut butter, olives, and avocados are high in fat, but the fat is mainly the unsaturated type. These foods should be limited mainly to avoid excess calories and fat. OTHER EATING TIPS Snacks  Most sweets should be limited as snacks. They tend to be rich in calories and fats, and their caloric content outweighs their nutritional value. Some good choices in snacks are graham crackers, melba toast, soda crackers, bagels (no egg), English muffins, fruits, and vegetables. These snacks are preferable to snack crackers, [place] fries, and chips. Popcorn should be air-popped or cooked in small amounts of liquid vegetable oil. Desserts Eat fruit, low-fat  yogurt, and fruit ices. AVOID pastries, cake, and cookies. Sherbet, angel food cake, gelatin dessert, frozen low-fat yogurt, or other frozen products that do not contain saturated fat (pure fruit juice bars, frozen ice pops) are also acceptable.  COOKING METHODS Choose those methods that use little or no fat. They include: Poaching.  Braising.  Steaming.  Grilling.  Baking.  Stir-frying.  Broiling.  Microwaving.  Foods can be cooked in a nonstick pan without added fat, or use a nonfat cooking spray in regular cookware. Limit fried foods and avoid frying in saturated fat. Add moisture to lean meats by using water, broth, cooking wines, and other nonfat or low-fat sauces along with the cooking methods mentioned above. Soups and stews should be chilled after cooking. The fat that forms on top after a few hours in the refrigerator should be skimmed off. When preparing meals, avoid using excess salt. Salt can contribute to raising blood pressure in some people. EATING AWAY FROM HOME Order entres, potatoes, and vegetables without sauces or butter. When meat exceeds the size of a deck of cards (3 to 4 ounces), the rest can be taken home for another meal. Choose vegetable or fruit salads and ask for low-calorie salad dressings to be served on the side. Use dressings sparingly. Limit high-fat toppings, such as bacon, crumbled eggs, cheese, sunflower seeds, and olives. Ask for heart-healthy tub margarine instead of butter.  Hemorrhoids Hemorrhoids are dilated (enlarged) veins around the rectum. Sometimes clots will form in the veins. This makes them swollen and painful. These are called thrombosed hemorrhoids. Causes of hemorrhoids include:  Constipation.   Straining to have a bowel movement.   HEAVY LIFTING HOME CARE INSTRUCTIONS  Eat a well balanced diet and drink 6 to 8 glasses of water every day to avoid constipation. You may also use a bulk laxative.   Avoid straining to have bowel  movements.   Keep anal area dry and clean.   Do not use a donut shaped pillow or sit on the toilet for long periods. This increases blood pooling and pain.   Move your bowels when your body has the urge; this will require less straining and will decrease pain and pressure.

## 2014-01-04 NOTE — Op Note (Signed)
[facility] [address] [place], [address]   COLONOSCOPY PROCEDURE REPORT  PATIENT: Samuel Reyes, Samuel Reyes  MR#: [id number] BIRTHDATE: [birth year] , 49  yrs. old GENDER: Male ENDOSCOPIST: Barney Drain, MD REFERRED BY:   Lorayne Marek PROCEDURE DATE:  01/04/2014 PROCEDURE:   Colonoscopy with snare polypectomy and Colonoscopy with cold biopsy polypectomy INDICATIONS:Patient's personal history of adenomatous colon polyps.  MEDICATIONS: Demerol 75 mg IV, Versed 6 mg IV, and PREOP: Promethazine (Phenergan) 12.5mg  IV  DESCRIPTION OF PROCEDURE:    Physical exam was performed.  Informed consent was obtained from the patient after explaining the benefits, risks, and alternatives to procedure.  The patient was connected to monitor and placed in left lateral position. Continuous oxygen was provided by nasal cannula and IV medicine administered through an indwelling cannula.  After administration of sedation and rectal exam, the patients rectum was intubated and the EC-[id number] ([id number])  colonoscope was advanced under direct visualization to the cecum.  The scope was removed slowly by carefully examining the color, texture, anatomy, and integrity mucosa on the way out.  The patient was recovered in endoscopy and discharged home in satisfactory condition.    COLON FINDINGS: A sessile polyp measuring 4 mm in size was found in the descending colon.  A polypectomy was performed with cold forceps.  , Two polyps measuring 4-6 mm in size were found in the sigmoid colon.  A polypectomy was performed using snare cautery.  , and Moderate sized internal hemorrhoids were found.  PREP QUALITY: good.  CECAL W/D TIME: 16 minutes  COMPLICATIONS: None  ENDOSCOPIC IMPRESSION: 3 SESSILE POLYPS REMOVED  RECOMMENDATIONS: AWAIT BIOPSY HIGH FIBER DIET TCS IN 3-5 YEARS       _______________________________ Lorrin MaisBarney Drain, MD 01/04/2014 3:51 PM

## 2014-01-06 ENCOUNTER — Encounter (HOSPITAL_COMMUNITY): Payer: Self-pay | Admitting: Gastroenterology

## 2014-01-07 ENCOUNTER — Other Ambulatory Visit (INDEPENDENT_AMBULATORY_CARE_PROVIDER_SITE_OTHER): Payer: Self-pay | Admitting: General Surgery

## 2014-01-07 ENCOUNTER — Telehealth (INDEPENDENT_AMBULATORY_CARE_PROVIDER_SITE_OTHER): Payer: Self-pay

## 2014-01-07 NOTE — Telephone Encounter (Signed)
Called and spoke to patient to make aware that Dr. Excell Seltzer has reviewed his Colonoscopy and pathology.  Patient can proceed with Hernia Repair.  Patient advised that our [place] will call to schedule his surgery.  Patient advised to stop any aspirin, OTC NSAIDS 7 days prior to surgery.  Written orders placed in INBOX for OR schedulers.

## 2014-01-14 ENCOUNTER — Telehealth: Payer: Self-pay | Admitting: Gastroenterology

## 2014-01-14 NOTE — Telephone Encounter (Signed)
Please call pt. HE had simple adenomas removed. His ESOPHAGEAL Bx SHOWS ESOPHAGITIS FROM REFLUX AND THE stomach Bx shows gastritis.    CONTINUE OMEPRAZOLE.  TAKE 30 MINUTES PRIOR TO YOUR MEALS TWICE DAILY.  AVOID ITEMS THAT TRIGGER GASTRITIS.  FOLLOW A LOW FAT/HIGH FIBER DIET. AVOID ITEMS THAT CAUSE BLOATING. S  FOLLOW UP IN December 2015.  REPEAT COLONOSCOPY IN 5 YEARS DUE TO PERSONAL HISTORY OF POLYPS.

## 2014-01-15 NOTE — Telephone Encounter (Signed)
Called and informed pt.  

## 2014-01-18 NOTE — Patient Instructions (Signed)
Lew Prout  01/18/2014   Your procedure is scheduled on:  01/20/2014   0830am-1030am  Report to [facility].  Follow the Signs to [place] at   0630     am  Call this number if you have problems the morning of surgery: [phone]   Remember:   Do not eat food or drink liquids after midnight.   Take these medicines the morning of surgery with A SIP OF WATER:    Do not wear jewelry,  Do not wear lotions, powders, or perfumes,deodorant .   Men may shave face and neck.  Do not bring valuables to the hospital.  Contacts, dentures or bridgework may not be worn into surgery.  Leave suitcase in the car. After surgery it may be brought to your room.  For patients admitted to the hospital, checkout time is 11:00 AM the day of  discharge.        Please read over the following fact sheets that you were given: [facility] - Preparing for Surgery Before surgery, you can play an important role.  Because skin is not sterile, your skin needs to be as free of germs as possible.  You can reduce the number of germs on your skin by washing with CHG (chlorahexidine gluconate) soap before surgery.  CHG is an antiseptic cleaner which kills germs and bonds with the skin to continue killing germs even after washing. Please DO NOT use if you have an allergy to CHG or antibacterial soaps.  If your skin becomes reddened/irritated stop using the CHG and inform your nurse when you arrive at Short Stay. Do not shave (including legs and underarms) for at least 48 hours prior to the first CHG shower.  You may shave your face/neck. Please follow these instructions carefully:  1.  Shower with CHG Soap the night before surgery and the  morning of Surgery.  2.  If you choose to wash your hair, wash your hair first as usual with your  normal  shampoo.  3.  After you shampoo, rinse your hair and body thoroughly to remove the  shampoo.                           4.  Use CHG as you would any other liquid  soap.  You can apply chg directly  to the skin and wash                       Gently with a scrungie or clean washcloth.  5.  Apply the CHG Soap to your body ONLY FROM THE NECK DOWN.   Do not use on face/ open                           Wound or open sores. Avoid contact with eyes, ears mouth and genitals (private parts).                       Wash face,  Genitals (private parts) with your normal soap.             6.  Wash thoroughly, paying special attention to the area where your surgery  will be performed.  7.  Thoroughly rinse your body with warm water from the neck down.  8.  DO NOT shower/wash with your normal soap after using and rinsing off  the CHG  Soap.                9.  Pat yourself dry with a clean towel.            10.  Wear clean pajamas.            11.  Place clean sheets on your bed the night of your first shower and do not  sleep with pets. Day of Surgery : Do not apply any lotions/deodorants the morning of surgery.  Please wear clean clothes to the hospital/surgery center.  FAILURE TO FOLLOW THESE INSTRUCTIONS MAY RESULT IN THE CANCELLATION OF YOUR SURGERY PATIENT SIGNATURE_________________________________  NURSE SIGNATURE__________________________________  ________________________________________________________________________  Scherrie Merritts  01/18/2014   Your procedure is scheduled on:    Report to [facility].  Follow the Signs to [place] at        am  Call this number if you have problems the morning of surgery: [phone]   Remember:   Do not eat food or drink liquids after midnight.   Take these medicines the morning of surgery with A SIP OF WATER:    Do not wear jewelry, make-up or nail polish.  Do not wear lotions, powders, or perfumes. You may wear deodorant.  Do not shave 48 hours prior to surgery. Men may shave face and neck.  Do not bring valuables to the hospital.  Contacts, dentures or bridgework may not be worn into  surgery.  Leave suitcase in the car. After surgery it may be brought to your room.  For patients admitted to the hospital, checkout time is 11:00 AM the day of  discharge.   Patients discharged the day of surgery will not be allowed to drive  home.  Name and phone number of your driver:   SEE CHG INSTRUCTION SHEET    Please read over the following fact sheets that you were given: MRSA Information, coughing and deep breathing exercises, leg exercises             coughing and deep breathing exercises, leg exercises

## 2014-01-19 ENCOUNTER — Encounter (HOSPITAL_COMMUNITY): Payer: Self-pay

## 2014-01-19 ENCOUNTER — Encounter (HOSPITAL_COMMUNITY): Payer: Self-pay | Admitting: Pharmacy Technician

## 2014-01-19 ENCOUNTER — Encounter (HOSPITAL_COMMUNITY)
Admission: RE | Admit: 2014-01-19 | Discharge: 2014-01-19 | Disposition: A | Discharge status: Home / Self Care | Payer: Medicaid Other | Source: Ambulatory Visit | Attending: General Surgery | Admitting: General Surgery

## 2014-01-19 HISTORY — DX: Gastro-esophageal reflux disease without esophagitis: K21.9

## 2014-01-19 HISTORY — DX: Cerebral infarction, unspecified: I63.9

## 2014-01-19 LAB — BASIC METABOLIC PANEL
BUN: 11 mg/dL (ref 6–23)
CHLORIDE: 97 meq/L (ref 96–112)
CO2: 26 mEq/L (ref 19–32)
Calcium: 9.3 mg/dL (ref 8.4–10.5)
Creatinine, Ser: 0.99 mg/dL (ref 0.50–1.35)
GFR calc Af Amer: >90 mL/min (ref >90)
GFR calc non Af Amer: 88 mL/min — ABNORMAL LOW (ref >90)
Glucose, Bld: 86 mg/dL (ref 70–99)
Potassium: 4.8 mEq/L (ref 3.7–5.3)
SODIUM: 136 meq/L — AB (ref 137–147)

## 2014-01-19 LAB — CBC
HCT: 42.8 % (ref 39.0–52.0)
Hemoglobin: 14.1 g/dL (ref 13.0–17.0)
MCH: 28.8 pg (ref 26.0–34.0)
MCHC: 32.9 g/dL (ref 30.0–36.0)
MCV: 87.3 fL (ref 78.0–100.0)
Platelets: 475 10*3/uL — ABNORMAL HIGH (ref 150–400)
RBC: 4.9 MIL/uL (ref 4.22–5.81)
RDW: 16.7 % — AB (ref 11.5–15.5)
WBC: 7 10*3/uL (ref 4.0–10.5)

## 2014-01-19 NOTE — Progress Notes (Signed)
EKG-01/29/13 EPIC  CXR 7/3/ 14 EPIC

## 2014-01-20 ENCOUNTER — Encounter (HOSPITAL_COMMUNITY)
Admission: RE | Disposition: A | Discharge status: Home / Self Care | Payer: Self-pay | Source: Ambulatory Visit | Attending: General Surgery

## 2014-01-20 ENCOUNTER — Inpatient Hospital Stay (HOSPITAL_COMMUNITY): Payer: Medicaid Other | Admitting: Anesthesiology

## 2014-01-20 ENCOUNTER — Encounter (HOSPITAL_COMMUNITY): Payer: Medicaid Other | Admitting: Anesthesiology

## 2014-01-20 ENCOUNTER — Inpatient Hospital Stay (HOSPITAL_COMMUNITY)
Admission: RE | Admit: 2014-01-20 | Discharge: 2014-01-22 | DRG: 355 | Disposition: A | Discharge status: Home / Self Care | Payer: Medicaid Other | Source: Ambulatory Visit | Attending: General Surgery | Admitting: General Surgery

## 2014-01-20 ENCOUNTER — Encounter (HOSPITAL_COMMUNITY): Payer: Self-pay | Admitting: *Deleted

## 2014-01-20 DIAGNOSIS — Z860100 Personal history of colon polyps, unspecified: Secondary | ICD-10-CM

## 2014-01-20 DIAGNOSIS — K7460 Unspecified cirrhosis of liver: Secondary | ICD-10-CM | POA: Diagnosis present

## 2014-01-20 DIAGNOSIS — I10 Essential (primary) hypertension: Secondary | ICD-10-CM | POA: Diagnosis present

## 2014-01-20 DIAGNOSIS — J45909 Unspecified asthma, uncomplicated: Secondary | ICD-10-CM | POA: Diagnosis present

## 2014-01-20 DIAGNOSIS — K432 Incisional hernia without obstruction or gangrene: Secondary | ICD-10-CM

## 2014-01-20 DIAGNOSIS — Z01812 Encounter for preprocedural laboratory examination: Secondary | ICD-10-CM

## 2014-01-20 DIAGNOSIS — Z903 Acquired absence of stomach [part of]: Secondary | ICD-10-CM

## 2014-01-20 DIAGNOSIS — K219 Gastro-esophageal reflux disease without esophagitis: Secondary | ICD-10-CM | POA: Diagnosis present

## 2014-01-20 HISTORY — PX: INCISIONAL HERNIA REPAIR: SHX193

## 2014-01-20 SURGERY — REPAIR, HERNIA, INCISIONAL, LAPAROSCOPIC
Anesthesia: General

## 2014-01-20 MED ORDER — CHLORHEXIDINE GLUCONATE 4 % EX LIQD: 1.0000 "application " | Freq: Once | CUTANEOUS | Status: DC

## 2014-01-20 MED ORDER — HYDROMORPHONE HCL PF 1 MG/ML IJ SOLN
INTRAMUSCULAR | Status: DC | PRN
  Administered 2014-01-20 (×4): 0.5 mg via INTRAVENOUS

## 2014-01-20 MED ORDER — ONDANSETRON HCL 4 MG/2ML IJ SOLN
INTRAMUSCULAR | Status: AC
  Filled 2014-01-20: qty 2

## 2014-01-20 MED ORDER — OXYCODONE-ACETAMINOPHEN 5-325 MG PO TABS
1.0000 | ORAL_TABLET | ORAL | Status: DC | PRN
  Administered 2014-01-21 (×2): 1 via ORAL
  Administered 2014-01-21 – 2014-01-22 (×3): 2 via ORAL
  Filled 2014-01-20: qty 2
  Filled 2014-01-20 (×2): qty 1
  Filled 2014-01-20 (×2): qty 2

## 2014-01-20 MED ORDER — PROPOFOL 10 MG/ML IV BOLUS
INTRAVENOUS | Status: DC | PRN
  Administered 2014-01-20: 150 mg via INTRAVENOUS

## 2014-01-20 MED ORDER — 0.9 % SODIUM CHLORIDE (POUR BTL) OPTIME
TOPICAL | Status: DC | PRN
  Administered 2014-01-20: 1000 mL

## 2014-01-20 MED ORDER — MIRTAZAPINE 15 MG PO TABS
15.0000 mg | ORAL_TABLET | Freq: Every day | ORAL | Status: DC
  Administered 2014-01-20 – 2014-01-21 (×2): 15 mg via ORAL
  Filled 2014-01-20 (×3): qty 1

## 2014-01-20 MED ORDER — CIPROFLOXACIN IN D5W 400 MG/200ML IV SOLN
INTRAVENOUS | Status: AC
  Filled 2014-01-20: qty 200

## 2014-01-20 MED ORDER — PHENYLEPHRINE 40 MCG/ML (10ML) SYRINGE FOR IV PUSH (FOR BLOOD PRESSURE SUPPORT)
PREFILLED_SYRINGE | INTRAVENOUS | Status: AC
  Filled 2014-01-20: qty 10

## 2014-01-20 MED ORDER — BUPIVACAINE-EPINEPHRINE (PF) 0.25% -1:200000 IJ SOLN
INTRAMUSCULAR | Status: AC
  Filled 2014-01-20: qty 30

## 2014-01-20 MED ORDER — IPRATROPIUM-ALBUTEROL 18-103 MCG/ACT IN AERO: 2.0000 | INHALATION_SPRAY | Freq: Two times a day (BID) | RESPIRATORY_TRACT | Status: DC

## 2014-01-20 MED ORDER — ONDANSETRON HCL 4 MG PO TABS: 4.0000 mg | ORAL_TABLET | Freq: Four times a day (QID) | ORAL | Status: DC | PRN

## 2014-01-20 MED ORDER — ARIPIPRAZOLE 5 MG PO TABS
5.0000 mg | ORAL_TABLET | Freq: Every day | ORAL | Status: DC
  Administered 2014-01-20 – 2014-01-21 (×2): 5 mg via ORAL
  Filled 2014-01-20 (×3): qty 1

## 2014-01-20 MED ORDER — CIPROFLOXACIN IN D5W 400 MG/200ML IV SOLN: 400.0000 mg | INTRAVENOUS | Status: DC

## 2014-01-20 MED ORDER — GLYCOPYRROLATE 0.2 MG/ML IJ SOLN
INTRAMUSCULAR | Status: DC | PRN
  Administered 2014-01-20: .8 mg via INTRAVENOUS

## 2014-01-20 MED ORDER — KCL IN DEXTROSE-NACL 20-5-0.9 MEQ/L-%-% IV SOLN
INTRAVENOUS | Status: DC
  Administered 2014-01-20 – 2014-01-21 (×3): via INTRAVENOUS
  Filled 2014-01-20 (×4): qty 1000

## 2014-01-20 MED ORDER — MORPHINE SULFATE 2 MG/ML IJ SOLN
2.0000 mg | INTRAMUSCULAR | Status: DC | PRN
  Administered 2014-01-20 – 2014-01-21 (×5): 2 mg via INTRAVENOUS
  Filled 2014-01-20 (×6): qty 1

## 2014-01-20 MED ORDER — HEPARIN SODIUM (PORCINE) 5000 UNIT/ML IJ SOLN
5000.0000 [IU] | Freq: Three times a day (TID) | INTRAMUSCULAR | Status: DC
  Administered 2014-01-20 – 2014-01-22 (×6): 5000 [IU] via SUBCUTANEOUS
  Filled 2014-01-20 (×9): qty 1

## 2014-01-20 MED ORDER — NEOSTIGMINE METHYLSULFATE 10 MG/10ML IV SOLN
INTRAVENOUS | Status: AC
  Filled 2014-01-20: qty 1

## 2014-01-20 MED ORDER — PHENYLEPHRINE HCL 10 MG/ML IJ SOLN
INTRAMUSCULAR | Status: DC | PRN
  Administered 2014-01-20: 40 ug via INTRAVENOUS

## 2014-01-20 MED ORDER — HYDROMORPHONE HCL PF 1 MG/ML IJ SOLN
0.2500 mg | INTRAMUSCULAR | Status: DC | PRN
  Administered 2014-01-20 (×4): 0.5 mg via INTRAVENOUS

## 2014-01-20 MED ORDER — IPRATROPIUM-ALBUTEROL 0.5-2.5 (3) MG/3ML IN SOLN
3.0000 mL | Freq: Four times a day (QID) | RESPIRATORY_TRACT | Status: DC
  Administered 2014-01-20 (×2): 3 mL via RESPIRATORY_TRACT
  Filled 2014-01-20 (×2): qty 3

## 2014-01-20 MED ORDER — LACTATED RINGERS IV SOLN
INTRAVENOUS | Status: DC
  Administered 2014-01-20: 11:00:00 via INTRAVENOUS

## 2014-01-20 MED ORDER — GLYCOPYRROLATE 0.2 MG/ML IJ SOLN
INTRAMUSCULAR | Status: AC
  Filled 2014-01-20: qty 3

## 2014-01-20 MED ORDER — LIDOCAINE HCL (CARDIAC) 20 MG/ML IV SOLN
INTRAVENOUS | Status: DC | PRN
Start: 2014-01-20 — End: 2014-01-20
  Administered 2014-01-20: 50 mg via INTRAVENOUS

## 2014-01-20 MED ORDER — IPRATROPIUM-ALBUTEROL 0.5-2.5 (3) MG/3ML IN SOLN
3.0000 mL | Freq: Two times a day (BID) | RESPIRATORY_TRACT | Status: DC
  Administered 2014-01-21 – 2014-01-22 (×3): 3 mL via RESPIRATORY_TRACT
  Filled 2014-01-20 (×3): qty 3

## 2014-01-20 MED ORDER — ONDANSETRON HCL 4 MG/2ML IJ SOLN: 4.0000 mg | Freq: Four times a day (QID) | INTRAMUSCULAR | Status: DC | PRN

## 2014-01-20 MED ORDER — FENTANYL CITRATE 0.05 MG/ML IJ SOLN
INTRAMUSCULAR | Status: AC
  Filled 2014-01-20: qty 5

## 2014-01-20 MED ORDER — MIDAZOLAM HCL 2 MG/2ML IJ SOLN
INTRAMUSCULAR | Status: AC
  Filled 2014-01-20: qty 2

## 2014-01-20 MED ORDER — LACTATED RINGERS IV SOLN
INTRAVENOUS | Status: DC | PRN
  Administered 2014-01-20: 08:00:00 via INTRAVENOUS

## 2014-01-20 MED ORDER — HYDROMORPHONE HCL PF 1 MG/ML IJ SOLN
INTRAMUSCULAR | Status: AC
  Filled 2014-01-20: qty 1

## 2014-01-20 MED ORDER — ROCURONIUM BROMIDE 100 MG/10ML IV SOLN
INTRAVENOUS | Status: DC | PRN
Start: 2014-01-20 — End: 2014-01-20
  Administered 2014-01-20: 5 mg via INTRAVENOUS
  Administered 2014-01-20: 10 mg via INTRAVENOUS
  Administered 2014-01-20: 5 mg via INTRAVENOUS
  Administered 2014-01-20: 40 mg via INTRAVENOUS
  Administered 2014-01-20: 10 mg via INTRAVENOUS

## 2014-01-20 MED ORDER — PROMETHAZINE HCL 25 MG/ML IJ SOLN: 6.2500 mg | INTRAMUSCULAR | Status: DC | PRN

## 2014-01-20 MED ORDER — PROPOFOL 10 MG/ML IV BOLUS
INTRAVENOUS | Status: AC
  Filled 2014-01-20: qty 20

## 2014-01-20 MED ORDER — MIDAZOLAM HCL 5 MG/5ML IJ SOLN
INTRAMUSCULAR | Status: DC | PRN
  Administered 2014-01-20: 2 mg via INTRAVENOUS

## 2014-01-20 MED ORDER — LIDOCAINE HCL (CARDIAC) 20 MG/ML IV SOLN
INTRAVENOUS | Status: AC
  Filled 2014-01-20: qty 5

## 2014-01-20 MED ORDER — HYDROCHLOROTHIAZIDE 12.5 MG PO CAPS
12.5000 mg | ORAL_CAPSULE | Freq: Every morning | ORAL | Status: DC
  Administered 2014-01-20 – 2014-01-22 (×3): 12.5 mg via ORAL
  Filled 2014-01-20 (×3): qty 1

## 2014-01-20 MED ORDER — METOPROLOL TARTRATE 25 MG PO TABS
25.0000 mg | ORAL_TABLET | Freq: Two times a day (BID) | ORAL | Status: DC
  Administered 2014-01-20 – 2014-01-22 (×4): 25 mg via ORAL
  Filled 2014-01-20 (×5): qty 1

## 2014-01-20 MED ORDER — ALBUTEROL SULFATE (2.5 MG/3ML) 0.083% IN NEBU: 3.0000 mL | INHALATION_SOLUTION | Freq: Four times a day (QID) | RESPIRATORY_TRACT | Status: DC | PRN

## 2014-01-20 MED ORDER — BUPIVACAINE-EPINEPHRINE 0.25% -1:200000 IJ SOLN
INTRAMUSCULAR | Status: DC | PRN
  Administered 2014-01-20: 9 mL

## 2014-01-20 MED ORDER — TRAZODONE HCL 100 MG PO TABS
200.0000 mg | ORAL_TABLET | Freq: Every day | ORAL | Status: DC
  Administered 2014-01-20 – 2014-01-21 (×2): 200 mg via ORAL
  Filled 2014-01-20 (×3): qty 2

## 2014-01-20 MED ORDER — ROCURONIUM BROMIDE 100 MG/10ML IV SOLN
INTRAVENOUS | Status: AC
  Filled 2014-01-20: qty 1

## 2014-01-20 MED ORDER — CIPROFLOXACIN IN D5W 400 MG/200ML IV SOLN
400.0000 mg | INTRAVENOUS | Status: AC
  Administered 2014-01-20: 400 mg via INTRAVENOUS

## 2014-01-20 MED ORDER — DEXAMETHASONE SODIUM PHOSPHATE 10 MG/ML IJ SOLN
INTRAMUSCULAR | Status: AC
  Filled 2014-01-20: qty 1

## 2014-01-20 MED ORDER — FENTANYL CITRATE 0.05 MG/ML IJ SOLN
INTRAMUSCULAR | Status: DC | PRN
  Administered 2014-01-20: 50 ug via INTRAVENOUS
  Administered 2014-01-20: 100 ug via INTRAVENOUS
  Administered 2014-01-20 (×2): 50 ug via INTRAVENOUS

## 2014-01-20 MED ORDER — PANTOPRAZOLE SODIUM 40 MG PO TBEC
40.0000 mg | DELAYED_RELEASE_TABLET | Freq: Every day | ORAL | Status: DC
  Administered 2014-01-21 – 2014-01-22 (×2): 40 mg via ORAL
  Filled 2014-01-20 (×2): qty 1

## 2014-01-20 MED ORDER — NEOSTIGMINE METHYLSULFATE 10 MG/10ML IV SOLN
INTRAVENOUS | Status: DC | PRN
  Administered 2014-01-20: 4 mg via INTRAVENOUS

## 2014-01-20 MED ORDER — HYDROMORPHONE HCL PF 2 MG/ML IJ SOLN
INTRAMUSCULAR | Status: AC
  Filled 2014-01-20: qty 1

## 2014-01-20 MED ORDER — FLUOXETINE HCL 10 MG PO CAPS
10.0000 mg | ORAL_CAPSULE | Freq: Every morning | ORAL | Status: DC
  Administered 2014-01-21 – 2014-01-22 (×2): 10 mg via ORAL
  Filled 2014-01-20 (×2): qty 1

## 2014-01-20 MED ORDER — GABAPENTIN 300 MG PO CAPS
300.0000 mg | ORAL_CAPSULE | Freq: Two times a day (BID) | ORAL | Status: DC
  Administered 2014-01-20 – 2014-01-22 (×5): 300 mg via ORAL
  Filled 2014-01-20 (×6): qty 1

## 2014-01-20 SURGICAL SUPPLY — 55 items
BENZOIN TINCTURE PRP APPL 2/3 (GAUZE/BANDAGES/DRESSINGS) IMPLANT
BINDER ABDOMINAL 12 ML 46-62 (SOFTGOODS) ×3 IMPLANT
CANISTER SUCTION 2500CC (MISCELLANEOUS) ×3 IMPLANT
CHLORAPREP W/TINT 26ML (MISCELLANEOUS) ×3 IMPLANT
CLOSURE WOUND 1/2 X4 (GAUZE/BANDAGES/DRESSINGS)
DECANTER SPIKE VIAL GLASS SM (MISCELLANEOUS) IMPLANT
DERMABOND ADVANCED (GAUZE/BANDAGES/DRESSINGS) ×2
DERMABOND ADVANCED .7 DNX12 (GAUZE/BANDAGES/DRESSINGS) ×1 IMPLANT
DEVICE SECURE STRAP 25 ABSORB (INSTRUMENTS) ×3 IMPLANT
DEVICE TROCAR PUNCTURE CLOSURE (ENDOMECHANICALS) ×3 IMPLANT
DISSECTOR BLUNT TIP ENDO 5MM (MISCELLANEOUS) IMPLANT
DRAPE INCISE IOBAN 66X45 STRL (DRAPES) ×3 IMPLANT
DRAPE LAPAROSCOPIC ABDOMINAL (DRAPES) ×3 IMPLANT
ELECT REM PT RETURN 9FT ADLT (ELECTROSURGICAL) ×3
ELECTRODE REM PT RTRN 9FT ADLT (ELECTROSURGICAL) ×1 IMPLANT
GLOVE BIOGEL PI IND STRL 7.0 (GLOVE) ×1 IMPLANT
GLOVE BIOGEL PI IND STRL 7.5 (GLOVE) ×1 IMPLANT
GLOVE BIOGEL PI INDICATOR 7.0 (GLOVE) ×2
GLOVE BIOGEL PI INDICATOR 7.5 (GLOVE) ×2
GLOVE SURG SS PI 7.0 STRL IVOR (GLOVE) ×3 IMPLANT
GLOVE SURG SS PI 7.5 STRL IVOR (GLOVE) ×3 IMPLANT
GOWN STRL REUS W/TWL LRG LVL3 (GOWN DISPOSABLE) ×3 IMPLANT
GOWN STRL REUS W/TWL XL LVL3 (GOWN DISPOSABLE) ×9 IMPLANT
KIT BASIN OR (CUSTOM PROCEDURE TRAY) ×3 IMPLANT
MARKER SKIN DUAL TIP RULER LAB (MISCELLANEOUS) ×3 IMPLANT
MESH VENTRALIGHT ST 6X8 (Mesh Specialty) ×2 IMPLANT
MESH VENTRLGHT ELLIPSE 8X6XMFL (Mesh Specialty) ×1 IMPLANT
NEEDLE INSUFFLATION 14GA 150MM (NEEDLE) IMPLANT
NEEDLE SPNL 22GX3.5 QUINCKE BK (NEEDLE) ×3 IMPLANT
NS IRRIG 1000ML POUR BTL (IV SOLUTION) ×3 IMPLANT
PENCIL BUTTON HOLSTER BLD 10FT (ELECTRODE) IMPLANT
SCISSORS LAP 5X35 DISP (ENDOMECHANICALS) ×3 IMPLANT
SET IRRIG TUBING LAPAROSCOPIC (IRRIGATION / IRRIGATOR) IMPLANT
SHEARS HARMONIC ACE PLUS 36CM (ENDOMECHANICALS) ×3 IMPLANT
SLEEVE ADV FIXATION 5X100MM (TROCAR) IMPLANT
SOLUTION ANTI FOG 6CC (MISCELLANEOUS) ×3 IMPLANT
STAPLER VISISTAT 35W (STAPLE) IMPLANT
STRIP CLOSURE SKIN 1/2X4 (GAUZE/BANDAGES/DRESSINGS) IMPLANT
SUT MNCRL AB 4-0 PS2 18 (SUTURE) ×3 IMPLANT
SUT NOVA NAB GS-21 0 18 T12 DT (SUTURE) ×6 IMPLANT
SUT PROLENE 0 CT 1 CR/8 (SUTURE) IMPLANT
TACKER 5MM HERNIA 3.5CML NAB (ENDOMECHANICALS) IMPLANT
TOWEL OR 17X26 10 PK STRL BLUE (TOWEL DISPOSABLE) ×3 IMPLANT
TOWEL OR NON WOVEN STRL DISP B (DISPOSABLE) ×3 IMPLANT
TRAY FOLEY CATH 14FRSI W/METER (CATHETERS) ×3 IMPLANT
TRAY LAP CHOLE (CUSTOM PROCEDURE TRAY) ×3 IMPLANT
TROCAR ADV FIXATION 11X100MM (TROCAR) IMPLANT
TROCAR ADV FIXATION 5X100MM (TROCAR) IMPLANT
TROCAR BLADELESS OPT 5 100 (ENDOMECHANICALS) ×6 IMPLANT
TROCAR BLADELESS OPT 5 75 (ENDOMECHANICALS) ×3 IMPLANT
TROCAR XCEL BLUNT TIP 100MML (ENDOMECHANICALS) IMPLANT
TROCAR XCEL NON-BLD 11X100MML (ENDOMECHANICALS) ×3 IMPLANT
TROCAR XCEL UNIV SLVE 11M 100M (ENDOMECHANICALS) IMPLANT
TUBING FILTER THERMOFLATOR (ELECTROSURGICAL) IMPLANT
TUBING INSUFFLATION 10FT LAP (TUBING) ×3 IMPLANT

## 2014-01-20 NOTE — Interval H&P Note (Signed)
History and Physical Interval Note:  01/20/2014 8:28 AM  Samuel Reyes  has presented today for surgery, with the diagnosis of recurrent incisional hernia  The various methods of treatment have been discussed with the patient and family. After consideration of risks, benefits and other options for treatment, the patient has consented to  Procedure(s): LAPAROSCOPIC RECURRENT  INCISIONAL HERNIA (N/A) as a surgical intervention .  The patient's history has been reviewed, patient examined, no change in status, stable for surgery.  I have reviewed the patient's chart and labs.  Questions were answered to the patient's satisfaction.     HOXWORTH,BENJAMIN T

## 2014-01-20 NOTE — Op Note (Signed)
Preoperative Diagnosis: recurrent incisional hernia  Postoprative Diagnosis: recurrent incisional hernia  Procedure: Procedure(s): LAPAROSCOPIC RECURRENT  INCISIONAL HERNIA with mesh   Surgeon: Excell Seltzer T   Assistants: None  Anesthesia:  General endotracheal anesthesia  Indications: patient is a 58 year old male with a remote history of gastrectomy for ulcer disease. He states that over the years he has had at least 15 repairs of ventral incisional hernia secondary to that procedure. [place] and the last one was done over 10 years ago. He is not sure if mesh was used. He now presents with a symptomatic enlarging bulge at the umbilicus and exam confirms a recurrent ventral incisional hernia at the lower end of his incision near the umbilicus several centimeters in diameter. I recommend proceeding with laparoscopic and possible open repair. We have previously discussed the indications and nature of the procedure and risks detailed extensively elsewhere.    Procedure Detail:  Patient was brought to the operating room, placed in supine position on the operating table, and general endotracheal anesthesia induced. Foley catheter was placed. He received preoperative IV antibiotics. PAS were in place. The abdomen was widely sterilely prepped and draped. Patient timeout was performed and correct procedure verified. Access was obtained with a 5 mm Optiview trocar in the left upper quadrant without difficulty and pneumoperitoneum established. Fortunately adhesions were not severe, located only in the midline and were of hole without any bowel adhesions. There was an obvious small discrete hernia right at the umbilicus and a portion of the omentum was bluntly reduced from the hernia defect it measured about 2 cm in diameter. An additional 5 mm and 12 mm trocar were placed laterally and in the left lower quadrant. The Harmonic Scalpel was used to take down omental adhesions  completely clearing the midline incision up to the xiphoid. I did encounter a second small approximately 1 cm defect about 7 cm above the umbilicus. There were no bowel adhesions. The remainder of the incision appeared intact. As there were 2 separate defects fairly widely spaced I elected to reinforce the entire midline incision using a 20 x 15 cm piece of VentralLight mesh. 8  0 Novafil stay sutures were placed circumferentially around the mesh.  Initially I marked the superior and inferior suture sites on the anterior abdominal wall about a centimeter out from each of these 2 sutures and widely above and below the 2 defects. The mesh was moistened and introduced to the abdomen and unfurled. The 2 superior and inferior sutures were brought up through the abdominal wall with the Endo Close showing the mesh tautly deployed in this dimension. Holding the sutures in place I then stretched each suture after laterally on both sides to determine the stab wound site and the other 6 sutures were brought up through the anterior abdominal wall. There was nice taut broad deployment of the mesh and the sutures were all secured. The Securestrap tacker was then used to circumferentially tack the mesh, using one additional 5 mm trocar in the right lateral abdomen. The mesh was very tautly deployed and widely surrounding the defect. The abdomen was carefully inspected for bleeding or any evidence of bowel injury and everything looked fine. All CO2 was evacuated and trochars removed. Skin was closed with subcuticular 4-0 Monocryll and Dermabond. Sponge needle instrument counts were correct.    Findings: As above  Estimated Blood Loss:  Minimal         Drains: none  Blood Given: none  Specimens: none        Complications:  * No complications entered in OR log *         Disposition: PACU - hemodynamically stable.         Condition: stable

## 2014-01-20 NOTE — Anesthesia Preprocedure Evaluation (Signed)
Anesthesia Evaluation  Patient identified by MRN, date of birth, ID band Patient awake    Reviewed: Allergy & Precautions, H&P , NPO status , Patient's Chart, lab work & pertinent test results  Airway Mallampati: II TM Distance: >3 FB Neck ROM: Full    Dental no notable dental hx.    Pulmonary asthma ,  breath sounds clear to auscultation  Pulmonary exam normal       Cardiovascular hypertension, Rhythm:Regular Rate:Normal     Neuro/Psych TIAnegative psych ROS   GI/Hepatic GERD-  ,(+) Cirrhosis -      ,   Endo/Other  negative endocrine ROS  Renal/GU negative Renal ROS  negative genitourinary   Musculoskeletal negative musculoskeletal ROS (+)   Abdominal   Peds negative pediatric ROS (+)  Hematology negative hematology ROS (+)   Anesthesia Other Findings   Reproductive/Obstetrics negative OB ROS                           Anesthesia Physical Anesthesia Plan  ASA: III  Anesthesia Plan: General   Post-op Pain Management:    Induction: Intravenous  Airway Management Planned: Oral ETT  Additional Equipment:   Intra-op Plan:   Post-operative Plan: Extubation in OR  Informed Consent: I have reviewed the patients History and Physical, chart, labs and discussed the procedure including the risks, benefits and alternatives for the proposed anesthesia with the patient or authorized representative who has indicated his/her understanding and acceptance.   Dental advisory given  Plan Discussed with: CRNA and Surgeon  Anesthesia Plan Comments:         Anesthesia Quick Evaluation

## 2014-01-20 NOTE — Anesthesia Postprocedure Evaluation (Signed)
  Anesthesia Post-op Note  Patient: Samuel Reyes  Procedure(s) Performed: Procedure(s) (LRB): LAPAROSCOPIC RECURRENT  INCISIONAL HERNIA with mesh (N/A)  Patient Location: PACU  Anesthesia Type: General  Level of Consciousness: awake and alert   Airway and Oxygen Therapy: Patient Spontanous Breathing  Post-op Pain: mild  Post-op Assessment: Post-op Vital signs reviewed, Patient's Cardiovascular Status Stable, Respiratory Function Stable, Patent Airway and No signs of Nausea or vomiting  Last Vitals:  Filed Vitals:   01/20/14 1100  BP: 159/80  Pulse: 68  Temp:   Resp: 13    Post-op Vital Signs: stable   Complications: No apparent anesthesia complications

## 2014-01-20 NOTE — Transfer of Care (Signed)
Immediate Anesthesia Transfer of Care Note  Patient: Samuel Reyes  Procedure(s) Performed: Procedure(s) (LRB): LAPAROSCOPIC RECURRENT  INCISIONAL HERNIA with mesh (N/A)  Patient Location: PACU  Anesthesia Type: General  Level of Consciousness: sedated, patient cooperative and responds to stimulation  Airway & Oxygen Therapy: Patient Spontanous Breathing and Patient connected to face mask oxgen  Post-op Assessment: Report given to PACU RN and Post -op Vital signs reviewed and stable  Post vital signs: Reviewed and stable  Complications: No apparent anesthesia complications

## 2014-01-20 NOTE — H&P (View-Only) (Signed)
Subjective:   recurrent ventral incisional hernia  Patient ID: Samuel Reyes, male   DOB: [birth year], 58 y.o.   MRN: [id number]  HPI Patient is referred by Dr. Lenon Ahmadi from [facility] 4 symptomatic ventral hernia. The patient has a history of gastrectomy for ulcer disease 40 years ago. He states that since that time he has had multiple incisional hernia repairs, approximately 15 by his estimation. Most of these were many years ago. They were mostly in [place] and some in [place]. The last repair was about 16 years ago. He is not sure that any mesh was ever used. For the last several weeks he has had an intermittent painful bulge near the umbilicus that he has to push back in. No GI symptoms. He is due a colonoscopy in the near future.  Past Medical History  Diagnosis Date  . Cirrhosis   . Degenerative joint disease   . Hypertension   . Gastric ulcer   . Depression   . Asthma   . Mitral valve prolapse 2002  . H/O hiatal hernia   . Hiatal hernia 1982   Past Surgical History  Procedure Laterality Date  . Gastrectomy    . Hernia repair    . Back surgery    . Shoulder surgery    . Rt knee arthroscopic surgery     Current Outpatient Prescriptions  Medication Sig Dispense Refill  . albuterol (PROVENTIL HFA;VENTOLIN HFA) 108 (90 BASE) MCG/ACT inhaler Inhale 2 puffs into the lungs every 6 (six) hours as needed for wheezing or shortness of breath.  1 Inhaler  5  . albuterol-ipratropium (COMBIVENT) 18-103 MCG/ACT inhaler Inhale 2 puffs into the lungs 2 (two) times daily.  1 Inhaler  5  . ARIPiprazole (ABILIFY) 5 MG tablet Take 1 tablet (5 mg total) by mouth daily. For mood stability.  30 tablet  0  . Aspirin-Acetaminophen-Caffeine (GOODYS EXTRA STRENGTH) 500-325-65 MG PACK Take 5 packets by mouth as needed (Use less than current use (5x /day)).      Marland Kitchen FLUoxetine (PROZAC) 10 MG capsule Take 1 capsule (10 mg total) by mouth daily. For depression.  30 capsule  0  . gabapentin (NEURONTIN) 300  MG capsule Take 1 capsule (300 mg total) by mouth 2 (two) times daily.  60 capsule  4  . hydrochlorothiazide (HYDRODIURIL) 12.5 MG tablet Take 1 tablet (12.5 mg total) by mouth daily.  30 tablet  4  . metoprolol tartrate (LOPRESSOR) 25 MG tablet Take 1 tablet (25 mg total) by mouth 2 (two) times daily.  60 tablet  3  . mirtazapine (REMERON) 15 MG tablet Take 1 tablet (15 mg total) by mouth at bedtime. For depression and sleep.  30 tablet  0  . naproxen (NAPROSYN) 500 MG tablet Take 1 tablet (500 mg total) by mouth 2 (two) times daily with a meal. For knee pain  60 tablet  4  . nicotine (NICODERM CQ - DOSED IN MG/24 HOURS) 14 mg/24hr patch Place 1 patch onto the skin daily.  28 patch  2  . pantoprazole (PROTONIX) 20 MG tablet Take 1 tablet (20 mg total) by mouth 2 (two) times daily.  60 tablet  4   No current facility-administered medications for this visit.   Allergies  Allergen Reactions  . Bee Venom Anaphylaxis  . Penicillins Rash     Review of Systems  Constitutional: Negative.   Respiratory: Positive for shortness of breath and wheezing. Negative for choking and stridor.   Cardiovascular: Negative.  Gastrointestinal: Negative.   Genitourinary: Negative.   Musculoskeletal: Positive for arthralgias.       Objective:   Physical Exam BP 132/82  Pulse 64  Temp(Src) 98.2 F (36.8 C) (Oral)  Resp 18  Ht 5\' 4"  (1.626 m)  Wt 156 lb 6.4 oz (70.943 kg)  BMI 26.83 kg/m2 General: Well-developed Caucasian male no distress Skin: Warm and dry without rash or infection. HEENT: No palpable masses or thyromegaly. Sclera nonicteric. Pupils equal round and reactive. Oropharynx clear. Lymph nodes: No cervical, supraclavicular, or inguinal nodes palpable. Lungs: Breath sounds clear and equal without increased work of breathing Cardiovascular: Regular rate and rhythm without murmur. No JVD or edema. Peripheral pulses intact. Abdomen: Nondistended. Soft and nontender. No masses palpable. No  organomegaly. There is an upper midline incision that is well-healed. There is a discrete probably 2-3 cm reducible hernia near the umbilicus. The abdominal wall in general is intact with no other palpable hernias. Extremities: No edema or joint swelling or deformity. No chronic venous stasis changes. Neurologic: Alert and fully oriented. Gait normal.    Assessment:     Multiply recurrent ventral incisional hernia. His last repair however was over 15 years ago not sure that mesh has ever been used. This is symptomatic and will need to be repaired. He has a history of colon polyps and colonoscopy is due. He has an appointment with her GI physician next week and we will wait until his colonoscopy is completed to fix his hernia. I discussed laparoscopic incisional hernia repair in detail including the indications and nature of the procedure, expected recovery, risks of anesthetic complications, cardiorespiratory complications, bleeding, infection, visceral injury, recurrent hernia and open surgery. He was given literature guarding the procedure.     Plan:     Schedule for laparoscopic repair of recurrent incisional hernia under general anesthesia at least overnight hospitalization. We will schedule after his colonoscopy is completed.

## 2014-01-20 NOTE — Telephone Encounter (Signed)
Reminder in epic °

## 2014-01-21 ENCOUNTER — Encounter (HOSPITAL_COMMUNITY): Payer: Self-pay | Admitting: General Surgery

## 2014-01-21 LAB — CBC
HCT: 41.4 % (ref 39.0–52.0)
Hemoglobin: 13.1 g/dL (ref 13.0–17.0)
MCH: 28.5 pg (ref 26.0–34.0)
MCHC: 31.6 g/dL (ref 30.0–36.0)
MCV: 90 fL (ref 78.0–100.0)
Platelets: 418 10*3/uL — ABNORMAL HIGH (ref 150–400)
RBC: 4.6 MIL/uL (ref 4.22–5.81)
RDW: 17.1 % — ABNORMAL HIGH (ref 11.5–15.5)
WBC: 12.3 10*3/uL — ABNORMAL HIGH (ref 4.0–10.5)

## 2014-01-21 LAB — BASIC METABOLIC PANEL
BUN: 7 mg/dL (ref 6–23)
CO2: 25 mEq/L (ref 19–32)
Calcium: 9.2 mg/dL (ref 8.4–10.5)
Chloride: 99 mEq/L (ref 96–112)
Creatinine, Ser: 0.87 mg/dL (ref 0.50–1.35)
GFR calc Af Amer: >90 mL/min (ref >90)
GLUCOSE: 141 mg/dL — AB (ref 70–99)
Potassium: 4.5 mEq/L (ref 3.7–5.3)
SODIUM: 140 meq/L (ref 137–147)

## 2014-01-21 NOTE — Care Management Note (Signed)
    Page 1 of 1   01/21/2014     11:20:53 AM CARE MANAGEMENT NOTE 01/21/2014  Patient:  Samuel Reyes, Samuel Reyes   Account Number:  [id number]  Date Initiated:  01/21/2014  Documentation initiated by:  Sunday Spillers  Subjective/Objective Assessment:   58 yo male admitted s/p incisional ventral hernia repair. PTA lived at home with mother.     Action/Plan:   Home when stable   Anticipated DC Date:  01/22/2014   Anticipated DC Plan:  [place]  CM consult      Choice offered to / List presented to:             Status of service:  Completed, signed off Medicare Important Message given?  NO (If response is "NO", the following Medicare IM given date fields will be blank) Date Medicare IM given:   Date Additional Medicare IM given:    Discharge Disposition:  HOME/SELF CARE  Per UR Regulation:  Reviewed for med. necessity/level of care/duration of stay  If discussed at [place] of Stay Meetings, dates discussed:    Comments:

## 2014-01-21 NOTE — Progress Notes (Signed)
Patient is tolerating clear and full liquid diet without complaints of nausea or vomiting, diet advanced to regular diet per previous electronic order Neta Mends RN BSN 01-21-14 12:53pm

## 2014-01-21 NOTE — Progress Notes (Signed)
Patient ID: Samuel Reyes, male   DOB: [birth year], 58 y.o.   MRN: [id number] 1 Day Post-Op  Subjective: No major complaints this morning. Denies severe pain. Just sore when moving around. On liquids without nausea.  Objective: Vital signs in last 24 hours: Temp:  [97.4 F (36.3 C)-98.6 F (37 C)] 97.4 F (36.3 C) (06/25 0620) Pulse Rate:  [68-90] 84 (06/25 0620) Resp:  [11-19] 18 (06/25 0620) BP: (138-172)/(77-100) 146/87 mmHg (06/25 0620) SpO2:  [92 %-100 %] 97 % (06/25 0620) Weight:  [160 lb (72.576 kg)] 160 lb (72.576 kg) (06/25 0620) Last BM Date: 01/19/14  Intake/Output from previous day: 06/24 0701 - 06/25 0700 In: 4165 [P.O.:1560; I.V.:2605] Out: 2950 [Urine:2900; Blood:50] Intake/Output this shift: Total I/O In: 2400 [P.O.:1200; I.V.:1200] Out: 2600 [Urine:2600]  General appearance: alert, cooperative and no distress GI: normal findings: soft, non-tender Incision/Wound: clean and dry  Lab Results:   Recent Labs  01/19/14 1410 01/21/14 0527  WBC 7.0 12.3*  HGB 14.1 13.1  HCT 42.8 41.4  PLT 475* 418*   BMET  Recent Labs  01/19/14 1410  NA 136*  K 4.8  CL 97  CO2 26  GLUCOSE 86  BUN 11  CREATININE 0.99  CALCIUM 9.3     Studies/Results: No results found.  Anti-infectives: Anti-infectives   Start     Dose/Rate Route Frequency Ordered Stop   01/20/14 0715  ciprofloxacin (CIPRO) IVPB 400 mg     400 mg 200 mL/hr over 60 Minutes Intravenous On call to O.R. 01/20/14 4193 01/20/14 0841   01/20/14 0656  ciprofloxacin (CIPRO) IVPB 400 mg  Status:  Discontinued     400 mg 200 mL/hr over 60 Minutes Intravenous On call to O.R. 01/20/14 0656 01/20/14 1207      Assessment/Plan: s/p Procedure(s): LAPAROSCOPIC RECURRENT  INCISIONAL HERNIA with mesh Doing well. Advanced regular diet as tolerated. Increase activity. Possible discharge tomorrow.   LOS: 1 day    HOXWORTH,BENJAMIN T 01/21/2014

## 2014-01-22 LAB — BASIC METABOLIC PANEL
BUN: 7 mg/dL (ref 6–23)
CO2: 28 mEq/L (ref 19–32)
Calcium: 9.1 mg/dL (ref 8.4–10.5)
Chloride: 101 mEq/L (ref 96–112)
Creatinine, Ser: 0.97 mg/dL (ref 0.50–1.35)
GFR, EST NON AFRICAN AMERICAN: 89 mL/min — AB (ref >90)
Glucose, Bld: 98 mg/dL (ref 70–99)
POTASSIUM: 4.3 meq/L (ref 3.7–5.3)
SODIUM: 139 meq/L (ref 137–147)

## 2014-01-22 LAB — CBC
HEMATOCRIT: 39.2 % (ref 39.0–52.0)
HEMOGLOBIN: 12.4 g/dL — AB (ref 13.0–17.0)
MCH: 28.8 pg (ref 26.0–34.0)
MCHC: 31.6 g/dL (ref 30.0–36.0)
MCV: 91 fL (ref 78.0–100.0)
Platelets: 385 10*3/uL (ref 150–400)
RBC: 4.31 MIL/uL (ref 4.22–5.81)
RDW: 17.4 % — ABNORMAL HIGH (ref 11.5–15.5)
WBC: 9.9 10*3/uL (ref 4.0–10.5)

## 2014-01-22 MED ORDER — OXYCODONE-ACETAMINOPHEN 5-325 MG PO TABS: 1.0000 | ORAL_TABLET | ORAL | Status: DC | PRN

## 2014-01-22 NOTE — Discharge Instructions (Signed)
[facility] _______[facility]   HERNIA REPAIR: POST OP INSTRUCTIONS  Always review your discharge instruction sheet given to you by the facility where your surgery was performed. IF YOU HAVE DISABILITY OR FAMILY LEAVE FORMS, YOU MUST BRING THEM TO THE OFFICE FOR PROCESSING.   DO NOT GIVE THEM TO YOUR DOCTOR.  1. A  prescription for pain medication may be given to you upon discharge.  Take your pain medication as prescribed, if needed.  If narcotic pain medicine is not needed, then you may take acetaminophen (Tylenol) or ibuprofen (Advil) as needed. 2. Take your usually prescribed medications unless otherwise directed. 3. If you need a refill on your pain medication, please contact your pharmacy.  They will contact our office to request authorization. Prescriptions will not be filled after 5 pm or on week-ends. 4. You should follow a light diet the first 24 hours after arrival home, such as soup and crackers, etc.  Be sure to include lots of fluids daily.  Resume your normal diet the day after surgery. 5. Most patients will experience some swelling and bruising around the incisions.  Ice packs and reclining will help.  Swelling and bruising can take several days to resolve.  6. It is common to experience some constipation if taking pain medication after surgery.  Increasing fluid intake and taking a stool softener (such as Colace) will usually help or prevent this problem from occurring.  A mild laxative (Milk of Magnesia or Miralax) should be taken according to package directions if there are no bowel movements after 48 hours. 7. Unless discharge instructions indicate otherwise, you may remove your bandages 72 hours after surgery, and you may shower at that time.  You may have steri-strips (small skin tapes) in place directly over the incision.  These strips should be left on the skin for 7-10 days.  If your surgeon used skin glue on the incision, you may shower in 24 hours.  The glue will  flake off over the next 2-3 weeks.  Any sutures or staples will be removed at the office during your follow-up visit. 8. ACTIVITIES:  You may resume regular (light) daily activities beginning the next day--such as daily self-care, walking, climbing stairs--gradually increasing activities as tolerated.  You may have sexual intercourse when it is comfortable.  Refrain from any heavy lifting or straining-nothing over 10 pounds for 6 weeks. a. You may drive when you are no longer taking prescription pain medication, you can comfortably wear a seatbelt, and you can safely maneuver your car and apply brakes. b. RETURN TO WORK:  _When release by Dr. Hoxworth._________________________________________________________ 9. You should see your doctor in the office for a follow-up appointment approximately 2-3 weeks after your surgery.  Make sure that you call for this appointment within a day or two after you arrive home to insure a convenient appointment time. 10. OTHER INSTRUCTIONS:  __________________________________________________________________________________________________________________________________________________________________________________________  WHEN TO CALL YOUR DOCTOR: 1. Fever over 101.0 2. Inability to urinate 3. Nausea and/or vomiting 4. Extreme swelling or bruising 5. Continued bleeding from incision. 6. Increased pain, redness, or drainage from the incision  The clinic staff is available to answer your questions during regular business hours.  Please dont hesitate to call and ask to speak to one of the nurses for clinical concerns.  If you have a medical emergency, go to the nearest emergency room or call 911.  A surgeon from [facility] is always on call at the hospital   [address]  [address], [place], [place], [place]  [address] ?  [address], [place], [place]   [address] [phone] ? [phone] ? FAX [phone] Web site:  [email or web address]

## 2014-01-22 NOTE — Progress Notes (Signed)
2 Days Post-Op  Subjective: Tolerating diet.  Passing gas.  Adequate pain control.  Objective: Vital signs in last 24 hours: Temp:  [98 F (36.7 C)-98.2 F (36.8 C)] 98 F (36.7 C) (06/26 0604) Pulse Rate:  [77-89] 83 (06/26 0604) Resp:  [18] 18 (06/26 0604) BP: (124-145)/(88-94) 145/92 mmHg (06/26 0604) SpO2:  [93 %-96 %] 94 % (06/26 0804) Last BM Date: 01/19/14  Intake/Output from previous day: 06/25 0701 - 06/26 0700 In: 3335.8 [P.O.:1680; I.V.:1655.8] Out: 4742 [Urine:3825] Intake/Output this shift: Total I/O In: 240 [P.O.:240] Out: 0   PE: General- In NAD Abdomen-soft, incisions clean and intact, binder on.  Lab Results:   Recent Labs  01/21/14 0527 01/22/14 0545  WBC 12.3* 9.9  HGB 13.1 12.4*  HCT 41.4 39.2  PLT 418* 385   BMET  Recent Labs  01/21/14 0527 01/22/14 0545  NA 140 139  K 4.5 4.3  CL 99 101  CO2 25 28  GLUCOSE 141* 98  BUN 7 7  CREATININE 0.87 0.97  CALCIUM 9.2 9.1   PT/INR No results found for this basename: LABPROT, INR,  in the last 72 hours Comprehensive Metabolic Panel:    Component Value Date/Time   NA 139 01/22/2014 0545   NA 140 01/21/2014 0527   K 4.3 01/22/2014 0545   K 4.5 01/21/2014 0527   CL 101 01/22/2014 0545   CL 99 01/21/2014 0527   CO2 28 01/22/2014 0545   CO2 25 01/21/2014 0527   BUN 7 01/22/2014 0545   BUN 7 01/21/2014 0527   CREATININE 0.97 01/22/2014 0545   CREATININE 0.87 01/21/2014 0527   CREATININE 0.87 02/17/2013 1541   GLUCOSE 98 01/22/2014 0545   GLUCOSE 141* 01/21/2014 0527   CALCIUM 9.1 01/22/2014 0545   CALCIUM 9.2 01/21/2014 0527   AST 17 02/17/2013 1541   AST 22 01/29/2013 1300   ALT 13 02/17/2013 1541   ALT 13 01/29/2013 1300   ALKPHOS 75 02/17/2013 1541   ALKPHOS 69 01/29/2013 1300   BILITOT 0.6 02/17/2013 1541   BILITOT 0.2* 01/29/2013 1300   PROT 7.8 02/17/2013 1541   PROT 7.4 01/29/2013 1300   ALBUMIN 4.6 02/17/2013 1541   ALBUMIN 3.6 01/29/2013 1300     Studies/Results: No results  found.  Anti-infectives: Anti-infectives   Start     Dose/Rate Route Frequency Ordered Stop   01/20/14 0715  ciprofloxacin (CIPRO) IVPB 400 mg     400 mg 200 mL/hr over 60 Minutes Intravenous On call to O.R. 01/20/14 5956 01/20/14 0841   01/20/14 0656  ciprofloxacin (CIPRO) IVPB 400 mg  Status:  Discontinued     400 mg 200 mL/hr over 60 Minutes Intravenous On call to O.R. 01/20/14 0656 01/20/14 1207      Assessment Active Problems:   Recurrent ventral incisional hernia s/p lap repair with mesh 01/20/14-progressing well    LOS: 2 days   Plan: Discharge.  Instructions given.   Samuel Reyes Lenna Sciara 01/22/2014

## 2014-01-24 NOTE — Discharge Summary (Signed)
Physician Discharge Summary  Patient ID: Samuel Reyes MRN: [id number] DOB/AGE: 1955-11-18 58 y.o.  Admit date: 01/20/2014 Discharge date: 01/22/2014  Admission Diagnoses:  Recurrent ventral incisional hernia  Discharge Diagnoses:  Active Problems:   Recurrent ventral incisional hernia   Discharged Condition: good  Hospital Course: He was admitted and underwent elective lap for scopic repair of her recurrent ventral incisional hernia with mesh. On his first postoperative day his diet was advanced. By second postoperative day he was doing well with adequate pain control, tolerating his diet, and ready for discharge. Discharge instructions were given to him.  Consults: None  Significant Diagnostic Studies: none  Treatments: surgery: Laparoscopic repair of recurrent ventral incisional hernia with mesh  Discharge Exam: Blood pressure 145/92, pulse 83, temperature 98 F (36.7 C), temperature source Oral, resp. rate 18, height 5\' 4"  (1.626 m), weight 160 lb (72.576 kg), SpO2 94.00%.   Disposition: 01-Home or Self Care     Medication List         albuterol 108 (90 BASE) MCG/ACT inhaler  Commonly known as:  PROVENTIL HFA;VENTOLIN HFA  Inhale 2 puffs into the lungs every 6 (six) hours as needed for wheezing or shortness of breath.     albuterol-ipratropium 18-103 MCG/ACT inhaler  Commonly known as:  COMBIVENT  Inhale 2 puffs into the lungs 2 (two) times daily.     ARIPiprazole 5 MG tablet  Commonly known as:  ABILIFY  Take 5 mg by mouth at bedtime.     FLUoxetine 10 MG capsule  Commonly known as:  PROZAC  Take 10 mg by mouth every morning.     gabapentin 300 MG capsule  Commonly known as:  NEURONTIN  Take 1 capsule (300 mg total) by mouth 2 (two) times daily.     GOODYS EXTRA STRENGTH 500-325-65 MG Pack  Generic drug:  Aspirin-Acetaminophen-Caffeine  Take 1 packet by mouth 4 (four) times daily as needed (Pain).     hydrochlorothiazide 12.5 MG capsule  Commonly  known as:  MICROZIDE  Take 12.5 mg by mouth every morning.     metoprolol tartrate 25 MG tablet  Commonly known as:  LOPRESSOR  Take 1 tablet (25 mg total) by mouth 2 (two) times daily.     mirtazapine 15 MG tablet  Commonly known as:  REMERON  Take 1 tablet (15 mg total) by mouth at bedtime. For depression and sleep.     naproxen 500 MG tablet  Commonly known as:  NAPROSYN  Take 1 tablet (500 mg total) by mouth 2 (two) times daily with a meal. For knee pain     omeprazole 20 MG capsule  Commonly known as:  PRILOSEC  Take 20 mg by mouth 2 (two) times daily.     oxyCODONE-acetaminophen 5-325 MG per tablet  Commonly known as:  PERCOCET/ROXICET  Take 1-2 tablets by mouth every 4 (four) hours as needed for moderate pain.     tetrahydrozoline 0.05 % ophthalmic solution  Place 1 drop into both eyes as needed (Eye irritation).     traZODone 100 MG tablet  Commonly known as:  DESYREL  Take 200 mg by mouth at bedtime.         Signed: Odis Hollingshead 01/24/2014, 3:05 PM

## 2014-02-15 ENCOUNTER — Telehealth (INDEPENDENT_AMBULATORY_CARE_PROVIDER_SITE_OTHER): Payer: Self-pay

## 2014-02-15 NOTE — Telephone Encounter (Signed)
Called and left message for patient.

## 2014-02-15 NOTE — Telephone Encounter (Signed)
Message copied by Ivor Costa on Mon Feb 15, 2014  3:23 PM ------      Message from: Aviva Signs      Created: Mon Feb 15, 2014  8:29 AM      Regarding: RE: po appt       Hey Chic do you want me to call pt.Thayer Headings      ----- Message -----         From: Ivor Costa, CMA         Sent: 02/12/2014   5:06 PM           To: Aviva Signs      Subject: po appt                                                  02/24/14 @ 9:00 am w/Dr, Excell Seltzer      ----- Message -----         From: Aviva Signs         Sent: 01/26/2014   4:52 PM           To: Ivor Costa, CMA            Pt needs 2 week po apt with [facility] please.Marland KitchenMarland [phone]             ------

## 2014-02-24 ENCOUNTER — Ambulatory Visit (INDEPENDENT_AMBULATORY_CARE_PROVIDER_SITE_OTHER): Payer: Medicaid Other | Admitting: General Surgery

## 2014-02-24 ENCOUNTER — Encounter (INDEPENDENT_AMBULATORY_CARE_PROVIDER_SITE_OTHER): Payer: Self-pay | Admitting: General Surgery

## 2014-02-24 VITALS — BP 134/86 | HR 78 | Resp 16 | Ht 64.0 in | Wt 154.4 lb

## 2014-02-24 DIAGNOSIS — Z09 Encounter for follow-up examination after completed treatment for conditions other than malignant neoplasm: Secondary | ICD-10-CM

## 2014-02-24 NOTE — Progress Notes (Signed)
:   Patient returns for followup one month following laparoscopic repair of a recurrent incisional hernia. He reports he is doing well with just some mild soreness but continues to improve and he is pleased with the result  Exam: BP 134/86  Pulse 78  Resp 16  Ht 5\' 4"  (1.626 m)  Wt 154 lb 6.4 oz (70.035 kg)  BMI 26.49 kg/m2 General: Appears well Abdomen: Incisions all well healed. His abdominal wall feels solid with no evidence of recurrent or persistent hernia.  Assessment and plan: Doing very well following the procedure. He is discharged return as needed.

## 2014-03-08 ENCOUNTER — Emergency Department (HOSPITAL_COMMUNITY): Payer: Medicaid Other

## 2014-03-08 ENCOUNTER — Encounter (HOSPITAL_COMMUNITY): Payer: Self-pay | Admitting: Emergency Medicine

## 2014-03-08 ENCOUNTER — Emergency Department (HOSPITAL_COMMUNITY)
Admission: EM | Admit: 2014-03-08 | Discharge: 2014-03-09 | Disposition: A | Discharge status: Home / Self Care | Payer: Medicaid Other | Source: Home / Self Care

## 2014-03-08 DIAGNOSIS — F1010 Alcohol abuse, uncomplicated: Secondary | ICD-10-CM

## 2014-03-08 DIAGNOSIS — R079 Chest pain, unspecified: Secondary | ICD-10-CM

## 2014-03-08 LAB — CBC
HEMATOCRIT: 41.5 % (ref 39.0–52.0)
HEMOGLOBIN: 13.7 g/dL (ref 13.0–17.0)
MCH: 29.8 pg (ref 26.0–34.0)
MCHC: 33 g/dL (ref 30.0–36.0)
MCV: 90.2 fL (ref 78.0–100.0)
Platelets: 323 10*3/uL (ref 150–400)
RBC: 4.6 MIL/uL (ref 4.22–5.81)
RDW: 15.8 % — ABNORMAL HIGH (ref 11.5–15.5)
WBC: 9.2 10*3/uL (ref 4.0–10.5)

## 2014-03-08 LAB — RAPID URINE DRUG SCREEN, HOSP PERFORMED
Amphetamines: NOT DETECTED
BENZODIAZEPINES: NOT DETECTED
Barbiturates: NOT DETECTED
COCAINE: NOT DETECTED
Opiates: NOT DETECTED
TETRAHYDROCANNABINOL: NOT DETECTED

## 2014-03-08 LAB — ETHANOL: Alcohol, Ethyl (B): <11 mg/dL (ref 0–11)

## 2014-03-08 LAB — I-STAT TROPONIN, ED: TROPONIN I, POC: 0 ng/mL (ref 0.00–0.08)

## 2014-03-08 LAB — COMPREHENSIVE METABOLIC PANEL
ALT: 14 U/L (ref 0–53)
AST: 20 U/L (ref 0–37)
Albumin: 3.8 g/dL (ref 3.5–5.2)
Alkaline Phosphatase: 67 U/L (ref 39–117)
Anion gap: 13 (ref 5–15)
BUN: 10 mg/dL (ref 6–23)
CALCIUM: 8.9 mg/dL (ref 8.4–10.5)
CHLORIDE: 100 meq/L (ref 96–112)
CO2: 26 meq/L (ref 19–32)
CREATININE: 0.9 mg/dL (ref 0.50–1.35)
GFR calc non Af Amer: >90 mL/min (ref >90)
GLUCOSE: 103 mg/dL — AB (ref 70–99)
Potassium: 4.1 mEq/L (ref 3.7–5.3)
Sodium: 139 mEq/L (ref 137–147)
Total Protein: 6.9 g/dL (ref 6.0–8.3)

## 2014-03-08 MED ORDER — FAMOTIDINE 20 MG PO TABS
20.0000 mg | ORAL_TABLET | Freq: Once | ORAL | Status: AC
  Administered 2014-03-08: 20 mg via ORAL
  Filled 2014-03-08: qty 1

## 2014-03-08 MED ORDER — FLUOXETINE HCL 20 MG PO CAPS
60.0000 mg | ORAL_CAPSULE | Freq: Every morning | ORAL | Status: DC
Start: 2014-03-08 — End: 2014-03-08
  Filled 2014-03-08: qty 3

## 2014-03-08 MED ORDER — HYDROCHLOROTHIAZIDE 12.5 MG PO CAPS: 12.5000 mg | ORAL_CAPSULE | Freq: Every day | ORAL | Status: DC

## 2014-03-08 MED ORDER — METOPROLOL TARTRATE 25 MG PO TABS
25.0000 mg | ORAL_TABLET | Freq: Two times a day (BID) | ORAL | Status: DC
  Administered 2014-03-08: 25 mg via ORAL
  Filled 2014-03-08: qty 1

## 2014-03-08 MED ORDER — ALBUTEROL SULFATE HFA 108 (90 BASE) MCG/ACT IN AERS: 2.0000 | INHALATION_SPRAY | Freq: Four times a day (QID) | RESPIRATORY_TRACT | Status: DC | PRN

## 2014-03-08 MED ORDER — FLUOXETINE HCL 20 MG PO CAPS
20.0000 mg | ORAL_CAPSULE | Freq: Three times a day (TID) | ORAL | Status: DC
  Administered 2014-03-08: 20 mg via ORAL
  Filled 2014-03-08 (×2): qty 1

## 2014-03-08 MED ORDER — LORAZEPAM 1 MG PO TABS
0.0000 mg | ORAL_TABLET | Freq: Four times a day (QID) | ORAL | Status: DC
  Administered 2014-03-08: 1 mg via ORAL
  Filled 2014-03-08: qty 1

## 2014-03-08 MED ORDER — ACETAMINOPHEN 325 MG PO TABS: 650.0000 mg | ORAL_TABLET | ORAL | Status: DC | PRN

## 2014-03-08 MED ORDER — GABAPENTIN 300 MG PO CAPS
300.0000 mg | ORAL_CAPSULE | Freq: Two times a day (BID) | ORAL | Status: DC
  Administered 2014-03-08: 300 mg via ORAL
  Filled 2014-03-08: qty 1

## 2014-03-08 MED ORDER — NICOTINE 21 MG/24HR TD PT24
21.0000 mg | MEDICATED_PATCH | Freq: Every day | TRANSDERMAL | Status: DC
  Administered 2014-03-08: 21 mg via TRANSDERMAL
  Filled 2014-03-08: qty 1

## 2014-03-08 MED ORDER — ARIPIPRAZOLE 5 MG PO TABS
5.0000 mg | ORAL_TABLET | Freq: Every day | ORAL | Status: DC
  Administered 2014-03-08: 5 mg via ORAL
  Filled 2014-03-08: qty 1

## 2014-03-08 MED ORDER — IPRATROPIUM-ALBUTEROL 0.5-2.5 (3) MG/3ML IN SOLN
3.0000 mL | Freq: Two times a day (BID) | RESPIRATORY_TRACT | Status: DC
  Administered 2014-03-08: 3 mL via RESPIRATORY_TRACT
  Filled 2014-03-08: qty 3

## 2014-03-08 MED ORDER — NAPROXEN 250 MG PO TABS
250.0000 mg | ORAL_TABLET | Freq: Two times a day (BID) | ORAL | Status: DC
  Administered 2014-03-08: 250 mg via ORAL
  Filled 2014-03-08: qty 1

## 2014-03-08 MED ORDER — TRAZODONE HCL 50 MG PO TABS
200.0000 mg | ORAL_TABLET | Freq: Every day | ORAL | Status: DC
  Administered 2014-03-08: 200 mg via ORAL
  Filled 2014-03-08: qty 4

## 2014-03-08 MED ORDER — LORAZEPAM 1 MG PO TABS: 0.0000 mg | ORAL_TABLET | Freq: Two times a day (BID) | ORAL | Status: DC

## 2014-03-08 MED ORDER — GI COCKTAIL ~~LOC~~
30.0000 mL | Freq: Once | ORAL | Status: AC
  Administered 2014-03-08: 30 mL via ORAL
  Filled 2014-03-08: qty 30

## 2014-03-08 MED ORDER — FLUOXETINE HCL 10 MG PO CAPS: 10.0000 mg | ORAL_CAPSULE | Freq: Every morning | ORAL | Status: DC

## 2014-03-08 MED ORDER — PANTOPRAZOLE SODIUM 40 MG PO TBEC
40.0000 mg | DELAYED_RELEASE_TABLET | Freq: Every day | ORAL | Status: DC
  Administered 2014-03-08: 40 mg via ORAL
  Filled 2014-03-08: qty 1

## 2014-03-08 MED ORDER — IPRATROPIUM-ALBUTEROL 20-100 MCG/ACT IN AERS
2.0000 | INHALATION_SPRAY | Freq: Two times a day (BID) | RESPIRATORY_TRACT | Status: DC
Start: 2014-03-08 — End: 2014-03-08

## 2014-03-08 NOTE — ED Notes (Addendum)
Spoke to [employer] at [facility]. She advises that pt does not meet criteria for detox at this time. She states she will hold patient referral so if he begins to score they can offer him a detox bed. Tina at [facility] is aware

## 2014-03-08 NOTE — ED Notes (Signed)
Pt has arrived from xray 

## 2014-03-08 NOTE — ED Notes (Signed)
Faxed referral to [facility]

## 2014-03-08 NOTE — ED Notes (Signed)
Pt monitored by pulse ox, bp cuff, and 5-lead. 

## 2014-03-08 NOTE — ED Notes (Addendum)
Pt to ED for detox from alcohol.  Pt has been accepted at [facility] for Wednesday morning and was told today to come to ED to be medically cleared.  Pt reports drinking approximately 24 beers/day for the last month-last drink was last night.  Denies SIHI. Pt cooperative in triage.   Pt also reports left sided chest pain since last night- denies radiation of pain, denies SOB.

## 2014-03-08 NOTE — ED Notes (Signed)
PATIENT REPORTS HE HAS BEEN DRINKNING APPROX 1 CASE OF BEER A DAY FOR THE PAST MONTH. STATES HE HAS BEEN DRINKING MORE BECAUSE IT IS THE ANNIVERSARY OF HIS WIFES DEATH. (3 WEEKS AGO) WIFE DIED OF CROHNS 3 YEARS AGO. HE CURRENTLY LIVES WITH HIS MOTHER AND HELPS TO TAKE CARE OF HER. HE WENT TO [facility] TODAY IN HOPES OF GETTING HELP WITH HIS DRINKING BUT WAS SENT HERE FOR DETOX. STATES HE HAS HAD ISSUES WITH ALCOHOL FOR A NUMBER OF YEARS. 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 HE DENIES ANY SI OR HI. HE WALKS WITH A STEADY GAIT. PT APPRECIATIVE FOR ANY HELP HE CAN GET

## 2014-03-08 NOTE — ED Provider Notes (Signed)
CSN: [id number]     Arrival date & time 03/08/14  1451 History   First MD Initiated Contact with Patient 03/08/14 1545     Chief Complaint  Patient presents with  . Alcohol Problem  . Chest Pain     (Consider location/radiation/quality/duration/timing/severity/associated sxs/prior Treatment) Patient is a 58 y.o. male presenting with alcohol problem and chest pain. The history is provided by the patient.  Alcohol Problem Associated symptoms include chest pain. Pertinent negatives include no abdominal pain, no headaches and no shortness of breath.  Chest Pain Associated symptoms: no abdominal pain, no back pain, no fever, no headache, no palpitations, no shortness of breath and not vomiting   pt c/o needing etoh rehab/detox, states drinking 12-24 beers daily for the past month.  When stops drinking feels mildly shaky, but denies hx dts or complicated etoh withdrawal. Hx depression, and denies feeling unusually depressed, compliant w normal meds. No thoughts of harm to self or others.  Denies being through rehab or detox program in the past. Also states mid to left cp for the past day, constant, feels like heartbeat that he has had in past. Symptoms persistent and constant at rest. No change w position or activity level. No associated sob, nv or diaphoresis. Denies hx cad. No fam hx premature cad. No pleuritic pain. No leg pain or swelling. No dvt or pe.  +hx copd. +smoker.     Past Medical History  Diagnosis Date  . Cirrhosis   . Degenerative joint disease   . Hypertension   . Gastric ulcer   . Depression   . Asthma   . Mitral valve prolapse 2002  . H/O hiatal hernia   . COPD (chronic obstructive pulmonary disease)   . TIA (transient ischemic attack)     2010  . Stroke     TIA - 2010 - no deficits   . Shortness of breath     with exertion   . GERD (gastroesophageal reflux disease)   . Headache(784.0)   . Hiatal hernia 1982  . Degenerative joint disease    Past Surgical  History  Procedure Laterality Date  . Gastrectomy    . Shoulder surgery Bilateral     3 surgeries on on left, 2 surgeries on right   . Rt knee arthroscopic surgery    . Back surgery      3 cervical spine surgeries C4-C5 fused  . Hernia repair    . Finger surgery Left     2nd, 3rd, & 4th fingers were cut off by table saw and reattached  . Colonoscopy N/A 01/04/2014    Procedure: COLONOSCOPY;  Surgeon: Danie Binder, MD;  Location: [facility];  Service: Endoscopy;  Laterality: N/A;  1:45  . Esophagogastroduodenoscopy N/A 01/04/2014    Procedure: ESOPHAGOGASTRODUODENOSCOPY (EGD);  Surgeon: Danie Binder, MD;  Location: [facility];  Service: Endoscopy;  Laterality: N/A;  . Incisional hernia repair N/A 01/20/2014    Procedure: LAPAROSCOPIC RECURRENT  INCISIONAL HERNIA with mesh;  Surgeon: Edward Jolly, MD;  Location: [facility];  Service: General;  Laterality: N/A;   Family History  Problem Relation Age of Onset  . Cancer Father     bone  . Cancer Brother     lungs  . Stroke Maternal Grandmother   . Colon cancer Neg Hx   . Asthma Son     died at age 4 in his sleep   . Spina bifida Son     died at age  1    History  Substance Use Topics  . Smoking status: Current Every Day Smoker -- 0.50 packs/day for 45 years    Types: Cigarettes    Start date: 07/30/1966  . Smokeless tobacco: Never Used  . Alcohol Use: 3.6 oz/week    6 Cans of beer per week     Comment: couple of beers at a time 2-3 times weekly     Review of Systems  Constitutional: Negative for fever and chills.  HENT: Negative for sore throat.   Eyes: Negative for redness.  Respiratory: Negative for shortness of breath.   Cardiovascular: Positive for chest pain. Negative for palpitations and leg swelling.  Gastrointestinal: Negative for vomiting, abdominal pain and diarrhea.  Genitourinary: Negative for flank pain.  Musculoskeletal: Negative for back pain and neck pain.  Skin: Negative for rash.  Neurological:  Negative for headaches.  Hematological: Does not bruise/bleed easily.  Psychiatric/Behavioral: Negative for confusion.      Allergies  Bee venom and Penicillins  Home Medications   Prior to Admission medications   Medication Sig Start Date End Date Taking? Authorizing Provider  albuterol (PROVENTIL HFA;VENTOLIN HFA) 108 (90 BASE) MCG/ACT inhaler Inhale 2 puffs into the lungs every 6 (six) hours as needed for wheezing or shortness of breath. 06/15/13   Ripudeep Krystal Eaton, MD  albuterol-ipratropium (COMBIVENT) 18-103 MCG/ACT inhaler Inhale 2 puffs into the lungs 2 (two) times daily. 06/15/13   Ripudeep Krystal Eaton, MD  ARIPiprazole (ABILIFY) 5 MG tablet Take 5 mg by mouth at bedtime.    Historical Provider, MD  Aspirin-Acetaminophen-Caffeine (GOODYS EXTRA STRENGTH) [phone] MG PACK Take 1 packet by mouth 4 (four) times daily as needed (Pain).     Historical Provider, MD  FLUoxetine (PROZAC) 10 MG capsule Take 10 mg by mouth every morning.    Historical Provider, MD  gabapentin (NEURONTIN) 300 MG capsule Take 1 capsule (300 mg total) by mouth 2 (two) times daily. 06/15/13   Ripudeep Krystal Eaton, MD  hydrochlorothiazide (MICROZIDE) 12.5 MG capsule Take 12.5 mg by mouth every morning.    Historical Provider, MD  metoprolol tartrate (LOPRESSOR) 25 MG tablet Take 1 tablet (25 mg total) by mouth 2 (two) times daily. 12/28/13   Lorayne Marek, MD  mirtazapine (REMERON) 15 MG tablet Take 1 tablet (15 mg total) by mouth at bedtime. For depression and sleep. 06/15/13   Ripudeep Krystal Eaton, MD  naproxen (NAPROSYN) 500 MG tablet Take 1 tablet (500 mg total) by mouth 2 (two) times daily with a meal. For knee pain 10/28/13   Lorayne Marek, MD  omeprazole (PRILOSEC) 20 MG capsule Take 20 mg by mouth 2 (two) times daily.    Historical Provider, MD  tetrahydrozoline 0.05 % ophthalmic solution Place 1 drop into both eyes as needed (Eye irritation).    Historical Provider, MD  traZODone (DESYREL) 100 MG tablet Take 200 mg by mouth at  bedtime.    Historical Provider, MD   BP 123/77  Pulse 69  Temp(Src) 98.1 F (36.7 C) (Oral)  Resp 25  SpO2 100% Physical Exam  Nursing note and vitals reviewed. Constitutional: He is oriented to person, place, and time. He appears well-developed and well-nourished. No distress.  HENT:  Nose: Nose normal.  Mouth/Throat: Oropharynx is clear and moist.  Eyes: Conjunctivae are normal. Pupils are equal, round, and reactive to light. No scleral icterus.  Neck: Normal range of motion. Neck supple. No tracheal deviation present.  Cardiovascular: Normal rate, regular rhythm, normal heart sounds and intact  distal pulses.  Exam reveals no gallop and no friction rub.   No murmur heard. Pulmonary/Chest: Effort normal and breath sounds normal. No accessory muscle usage. No respiratory distress.  Abdominal: Soft. Bowel sounds are normal. He exhibits no distension and no mass. There is no tenderness. There is no rebound and no guarding.  Genitourinary:  No cva tenderness  Musculoskeletal: Normal range of motion. He exhibits no edema and no tenderness.  Neurological: He is alert and oriented to person, place, and time.  Skin: Skin is warm and dry. No rash noted. He is not diaphoretic.  Psychiatric: He has a normal mood and affect.    ED Course  Procedures (including critical care time) Labs Review  Results for orders placed during the hospital encounter of 03/08/14  CBC      Result Value Ref Range   WBC 9.2  4.0 - 10.5 K/uL   RBC 4.60  4.22 - 5.81 MIL/uL   Hemoglobin 13.7  13.0 - 17.0 g/dL   HCT 41.5  39.0 - 52.0 %   MCV 90.2  78.0 - 100.0 fL   MCH 29.8  26.0 - 34.0 pg   MCHC 33.0  30.0 - 36.0 g/dL   RDW 15.8 (*) 11.5 - 15.5 %   Platelets 323  150 - 400 K/uL  COMPREHENSIVE METABOLIC PANEL      Result Value Ref Range   Sodium 139  137 - 147 mEq/L   Potassium 4.1  3.7 - 5.3 mEq/L   Chloride 100  96 - 112 mEq/L   CO2 26  19 - 32 mEq/L   Glucose, Bld 103 (*) 70 - 99 mg/dL   BUN 10  6 -  23 mg/dL   Creatinine, Ser 0.90  0.50 - 1.35 mg/dL   Calcium 8.9  8.4 - 10.5 mg/dL   Total Protein 6.9  6.0 - 8.3 g/dL   Albumin 3.8  3.5 - 5.2 g/dL   AST 20  0 - 37 U/L   ALT 14  0 - 53 U/L   Alkaline Phosphatase 67  39 - 117 U/L   Total Bilirubin <0.2 (*) 0.3 - 1.2 mg/dL   GFR calc non Af Amer >90  >90 mL/min   GFR calc Af Amer >90  >90 mL/min   Anion gap 13  5 - 15  ETHANOL      Result Value Ref Range   Alcohol, Ethyl (B) <11  0 - 11 mg/dL  URINE RAPID DRUG SCREEN (HOSP PERFORMED)      Result Value Ref Range   Opiates NONE DETECTED  NONE DETECTED   Cocaine NONE DETECTED  NONE DETECTED   Benzodiazepines NONE DETECTED  NONE DETECTED   Amphetamines NONE DETECTED  NONE DETECTED   Tetrahydrocannabinol NONE DETECTED  NONE DETECTED   Barbiturates NONE DETECTED  NONE DETECTED  I-STAT TROPOININ, ED      Result Value Ref Range   Troponin i, poc 0.00  0.00 - 0.08 ng/mL   Comment 3            Dg Chest 2 View  03/08/2014   CLINICAL DATA:  Left-sided chest pain.  EXAM: CHEST  2 VIEW  COMPARISON:  01/29/2013  FINDINGS: Lungs are adequately inflated without consolidation or effusion. Cardiomediastinal silhouette and remainder of the exam is unchanged.  IMPRESSION: No active cardiopulmonary disease.   Electronically Signed   By: Marin Olp M.D.   On: 03/08/2014 15:54       Date: 03/08/2014  Rate:  69  Rhythm: normal sinus rhythm  QRS Axis: normal  Intervals: normal  ST/T Wave abnormalities: normal  Conduction Disutrbances:none  Narrative Interpretation:   Old EKG Reviewed: none available    MDM  Iv ns. Labs. pepcid and gi cocktail po for symptom relief, as pt notes long hx gerd/heartburn.  Labs.  Psych team consulted.   Reviewed nursing notes and prior charts for additional history.   After constant chest discomfort x 24 hrs, trop 0, felt not c/w acs.    Recheck pt comfortable. No ciwa, minimal withdrawal symptoms noted.  Psych eval pending.      Mirna Mires,  MD 03/08/14 [phone]

## 2014-03-08 NOTE — ED Notes (Signed)
PATIENT HAS ARRIVED ON POD C FROM XRAY. HE IS AWAKE ALERT AND ORIENTED. HE COMPLAINS OF A "HEARTBURN LIKE PAIN SINCE LAST NIGHT. HE IS WARM AND DRY AND DENIES ANY NEW SOB. DENIES NAUSEA. PATIENT HAS BEEN CHANGED INTO SCRUBS AND BELONGINGS SECURED AT NURSES STATION. PATIENT PLACED ON MONITOR AND MD IN TO SEE PT.

## 2014-03-09 ENCOUNTER — Encounter (HOSPITAL_COMMUNITY): Payer: Self-pay | Admitting: *Deleted

## 2014-03-09 ENCOUNTER — Inpatient Hospital Stay (HOSPITAL_COMMUNITY)
Admission: EM | Admit: 2014-03-09 | Discharge: 2014-03-12 | DRG: 897 | Disposition: A | Discharge status: Home / Self Care | Payer: Medicaid Other | Source: Intra-hospital | Attending: Psychiatry | Admitting: Psychiatry

## 2014-03-09 DIAGNOSIS — F17200 Nicotine dependence, unspecified, uncomplicated: Secondary | ICD-10-CM | POA: Diagnosis present

## 2014-03-09 DIAGNOSIS — M25561 Pain in right knee: Secondary | ICD-10-CM

## 2014-03-09 DIAGNOSIS — J449 Chronic obstructive pulmonary disease, unspecified: Secondary | ICD-10-CM | POA: Diagnosis present

## 2014-03-09 DIAGNOSIS — F1020 Alcohol dependence, uncomplicated: Secondary | ICD-10-CM | POA: Diagnosis present

## 2014-03-09 DIAGNOSIS — K219 Gastro-esophageal reflux disease without esophagitis: Secondary | ICD-10-CM | POA: Diagnosis present

## 2014-03-09 DIAGNOSIS — F4310 Post-traumatic stress disorder, unspecified: Secondary | ICD-10-CM | POA: Diagnosis present

## 2014-03-09 DIAGNOSIS — F332 Major depressive disorder, recurrent severe without psychotic features: Secondary | ICD-10-CM | POA: Diagnosis present

## 2014-03-09 DIAGNOSIS — F410 Panic disorder [episodic paroxysmal anxiety] without agoraphobia: Secondary | ICD-10-CM | POA: Diagnosis present

## 2014-03-09 DIAGNOSIS — G4700 Insomnia, unspecified: Secondary | ICD-10-CM | POA: Diagnosis present

## 2014-03-09 DIAGNOSIS — I10 Essential (primary) hypertension: Secondary | ICD-10-CM | POA: Diagnosis present

## 2014-03-09 DIAGNOSIS — R51 Headache: Secondary | ICD-10-CM | POA: Diagnosis present

## 2014-03-09 DIAGNOSIS — F10230 Alcohol dependence with withdrawal, uncomplicated: Secondary | ICD-10-CM

## 2014-03-09 DIAGNOSIS — M1990 Unspecified osteoarthritis, unspecified site: Secondary | ICD-10-CM | POA: Diagnosis present

## 2014-03-09 DIAGNOSIS — Z8673 Personal history of transient ischemic attack (TIA), and cerebral infarction without residual deficits: Secondary | ICD-10-CM

## 2014-03-09 DIAGNOSIS — J4489 Other specified chronic obstructive pulmonary disease: Secondary | ICD-10-CM | POA: Diagnosis present

## 2014-03-09 MED ORDER — IPRATROPIUM-ALBUTEROL 18-103 MCG/ACT IN AERO
2.0000 | INHALATION_SPRAY | Freq: Two times a day (BID) | RESPIRATORY_TRACT | Status: DC
  Filled 2014-03-09: qty 14.7

## 2014-03-09 MED ORDER — VITAMIN B-1 100 MG PO TABS
100.0000 mg | ORAL_TABLET | Freq: Every day | ORAL | Status: DC
  Administered 2014-03-10 – 2014-03-12 (×3): 100 mg via ORAL
  Filled 2014-03-09 (×5): qty 1

## 2014-03-09 MED ORDER — METOPROLOL TARTRATE 25 MG PO TABS
25.0000 mg | ORAL_TABLET | Freq: Two times a day (BID) | ORAL | Status: DC
  Administered 2014-03-09 – 2014-03-12 (×7): 25 mg via ORAL
  Filled 2014-03-09 (×3): qty 1
  Filled 2014-03-09: qty 8
  Filled 2014-03-09 (×4): qty 1
  Filled 2014-03-09: qty 8
  Filled 2014-03-09 (×2): qty 1

## 2014-03-09 MED ORDER — FLUOXETINE HCL 20 MG PO CAPS
20.0000 mg | ORAL_CAPSULE | Freq: Three times a day (TID) | ORAL | Status: DC
  Administered 2014-03-09 – 2014-03-12 (×11): 20 mg via ORAL
  Filled 2014-03-09 (×4): qty 1
  Filled 2014-03-09: qty 12
  Filled 2014-03-09: qty 1
  Filled 2014-03-09: qty 12
  Filled 2014-03-09 (×3): qty 1
  Filled 2014-03-09: qty 12
  Filled 2014-03-09 (×5): qty 1

## 2014-03-09 MED ORDER — ACETAMINOPHEN 325 MG PO TABS
650.0000 mg | ORAL_TABLET | Freq: Four times a day (QID) | ORAL | Status: DC | PRN
  Administered 2014-03-09 – 2014-03-12 (×4): 650 mg via ORAL
  Filled 2014-03-09 (×4): qty 2

## 2014-03-09 MED ORDER — NAPROXEN 500 MG PO TABS
500.0000 mg | ORAL_TABLET | Freq: Two times a day (BID) | ORAL | Status: DC
  Administered 2014-03-09 – 2014-03-11 (×6): 500 mg via ORAL
  Filled 2014-03-09: qty 1
  Filled 2014-03-09: qty 8
  Filled 2014-03-09 (×5): qty 1
  Filled 2014-03-09: qty 8
  Filled 2014-03-09 (×3): qty 1

## 2014-03-09 MED ORDER — LORAZEPAM 1 MG PO TABS
1.0000 mg | ORAL_TABLET | Freq: Three times a day (TID) | ORAL | Status: AC
  Administered 2014-03-10 (×3): 1 mg via ORAL
  Filled 2014-03-09 (×4): qty 1

## 2014-03-09 MED ORDER — ADULT MULTIVITAMIN W/MINERALS CH
1.0000 | ORAL_TABLET | Freq: Every day | ORAL | Status: DC
  Administered 2014-03-09 – 2014-03-12 (×4): 1 via ORAL
  Filled 2014-03-09 (×7): qty 1

## 2014-03-09 MED ORDER — LORAZEPAM 1 MG PO TABS
1.0000 mg | ORAL_TABLET | Freq: Four times a day (QID) | ORAL | Status: AC
  Administered 2014-03-09 (×3): 1 mg via ORAL
  Filled 2014-03-09 (×2): qty 1

## 2014-03-09 MED ORDER — ONDANSETRON 4 MG PO TBDP: 4.0000 mg | ORAL_TABLET | Freq: Four times a day (QID) | ORAL | Status: AC | PRN

## 2014-03-09 MED ORDER — THIAMINE HCL 100 MG/ML IJ SOLN
100.0000 mg | Freq: Once | INTRAMUSCULAR | Status: AC
  Administered 2014-03-09: 100 mg via INTRAMUSCULAR
  Filled 2014-03-09: qty 2

## 2014-03-09 MED ORDER — ALUM & MAG HYDROXIDE-SIMETH 200-200-20 MG/5ML PO SUSP: 30.0000 mL | ORAL | Status: DC | PRN

## 2014-03-09 MED ORDER — LORAZEPAM 1 MG PO TABS
1.0000 mg | ORAL_TABLET | Freq: Four times a day (QID) | ORAL | Status: AC | PRN
  Administered 2014-03-10 – 2014-03-11 (×2): 1 mg via ORAL
  Filled 2014-03-09 (×2): qty 1

## 2014-03-09 MED ORDER — GABAPENTIN 300 MG PO CAPS
300.0000 mg | ORAL_CAPSULE | Freq: Two times a day (BID) | ORAL | Status: DC
  Administered 2014-03-09 – 2014-03-12 (×7): 300 mg via ORAL
  Filled 2014-03-09 (×2): qty 1
  Filled 2014-03-09: qty 8
  Filled 2014-03-09: qty 1
  Filled 2014-03-09: qty 8
  Filled 2014-03-09 (×6): qty 1

## 2014-03-09 MED ORDER — MAGNESIUM HYDROXIDE 400 MG/5ML PO SUSP: 30.0000 mL | Freq: Every day | ORAL | Status: DC | PRN

## 2014-03-09 MED ORDER — NICOTINE 21 MG/24HR TD PT24
21.0000 mg | MEDICATED_PATCH | Freq: Every day | TRANSDERMAL | Status: DC
  Administered 2014-03-09 – 2014-03-12 (×4): 21 mg via TRANSDERMAL
  Filled 2014-03-09 (×6): qty 1

## 2014-03-09 MED ORDER — ALBUTEROL SULFATE (2.5 MG/3ML) 0.083% IN NEBU: 3.0000 mL | INHALATION_SOLUTION | Freq: Four times a day (QID) | RESPIRATORY_TRACT | Status: DC | PRN

## 2014-03-09 MED ORDER — HYDROXYZINE HCL 25 MG PO TABS
25.0000 mg | ORAL_TABLET | Freq: Four times a day (QID) | ORAL | Status: AC | PRN
  Administered 2014-03-09: 25 mg via ORAL
  Filled 2014-03-09: qty 1

## 2014-03-09 MED ORDER — TRAZODONE HCL 100 MG PO TABS
200.0000 mg | ORAL_TABLET | Freq: Every day | ORAL | Status: DC
  Administered 2014-03-09 – 2014-03-11 (×3): 200 mg via ORAL
  Filled 2014-03-09: qty 2
  Filled 2014-03-09: qty 8
  Filled 2014-03-09 (×3): qty 2

## 2014-03-09 MED ORDER — ALBUTEROL SULFATE HFA 108 (90 BASE) MCG/ACT IN AERS: 2.0000 | INHALATION_SPRAY | Freq: Four times a day (QID) | RESPIRATORY_TRACT | Status: DC | PRN

## 2014-03-09 MED ORDER — LOPERAMIDE HCL 2 MG PO CAPS: 2.0000 mg | ORAL_CAPSULE | ORAL | Status: AC | PRN

## 2014-03-09 MED ORDER — LORAZEPAM 1 MG PO TABS
1.0000 mg | ORAL_TABLET | Freq: Every day | ORAL | Status: AC
Start: 2014-03-12 — End: 2014-03-12
  Administered 2014-03-12: 1 mg via ORAL
  Filled 2014-03-09: qty 1

## 2014-03-09 MED ORDER — CHLORDIAZEPOXIDE HCL 25 MG PO CAPS
25.0000 mg | ORAL_CAPSULE | Freq: Four times a day (QID) | ORAL | Status: DC | PRN
  Filled 2014-03-09: qty 1

## 2014-03-09 MED ORDER — ARIPIPRAZOLE 5 MG PO TABS
5.0000 mg | ORAL_TABLET | Freq: Every day | ORAL | Status: DC
  Administered 2014-03-09 – 2014-03-12 (×4): 5 mg via ORAL
  Filled 2014-03-09: qty 1
  Filled 2014-03-09: qty 4
  Filled 2014-03-09 (×4): qty 1

## 2014-03-09 MED ORDER — LORAZEPAM 1 MG PO TABS
1.0000 mg | ORAL_TABLET | Freq: Two times a day (BID) | ORAL | Status: AC
  Administered 2014-03-11 (×2): 1 mg via ORAL
  Filled 2014-03-09 (×2): qty 1

## 2014-03-09 MED ORDER — IPRATROPIUM-ALBUTEROL 20-100 MCG/ACT IN AERS
1.0000 | INHALATION_SPRAY | Freq: Two times a day (BID) | RESPIRATORY_TRACT | Status: DC
  Administered 2014-03-09 – 2014-03-12 (×7): 1 via RESPIRATORY_TRACT
  Filled 2014-03-09: qty 4

## 2014-03-09 NOTE — Tx Team (Signed)
Initial Interdisciplinary Treatment Plan   PATIENT STRESSORS: Health problems Substance abuse   PROBLEM LIST: Problem List/Patient Goals Date to be addressed Date deferred Reason deferred Estimated date of resolution  ETOH abuse 03-09-14     Depression 03-09-14     DJD, Cirrhosis, GERDS, 03-08-14                                          DISCHARGE CRITERIA:  Verbal commitment to aftercare and medication compliance Withdrawal symptoms are absent or subacute and managed without 24-hour nursing intervention  PRELIMINARY DISCHARGE PLAN: Attend PHP/IOP Attend 12-step recovery group Participate in family therapy  PATIENT/FAMIILY INVOLVEMENT: This treatment plan has been presented to and reviewed with the patient, Samuel Reyes, and/or family member.  The patient and family have been given the opportunity to ask questions and make suggestions.  Samuel Reyes 03/09/2014, 5:26 AM

## 2014-03-09 NOTE — H&P (Signed)
Psychiatric Admission Assessment Adult  Patient Identification:  Samuel Reyes Date of Evaluation:  03/09/2014 Chief Complaint:  Alcohol Use Disorder,Severe History of Present Illness:: 58 Y/O male who states too much stuff has been happening to him in the last few years. States he has been missing all that he had back in [place]. He was admitted to this unit in July 4 and D/C February 04, 2013. After he left he did "real good for 3-4 months" states some new people moved accorss the street and they like to party. Started drinking again. States it build up to a case a day. States he was seeing a Social worker at [employer]. He has not been there for a while. He sees a Dr. Earl Lagos over telepsych. She prescribes for him. Has history of trauma, death of his son he tried to revive him, death of his wife  of Chron's she was 80 . States he lost everything he had in [place], his business, his family, there are only "two graves" states that when his wife died (3 years ago last month) everything went "to hell" states that on the anniversary he started drinking out of control. They were together 32 years. Admits persistent depression  Associated Signs/Synptoms: Depression Symptoms:  depressed mood, anhedonia, insomnia, fatigue, feelings of worthlessness/guilt, difficulty concentrating, suicidal thoughts without plan, anxiety, panic attacks, insomnia, loss of energy/fatigue, weight loss, decreased appetite, (Hypo) Manic Symptoms:  Denies Anxiety Symptoms:  Excessive Worry, Panic Symptoms, Psychotic Symptoms:  Denies PTSD Symptoms: Had a traumatic exposure:  death of son death of wife Re-experiencing:  Flashbacks Intrusive Thoughts Nightmares Total Time spent with patient: 45 minutes  Psychiatric Specialty Exam: Physical Exam  Review of Systems  Constitutional: Positive for weight loss and malaise/fatigue.  HENT:       Pounding, frontal   Eyes: Negative.   Respiratory: Positive for cough and shortness of  breath.        Smokes a pack a day  Cardiovascular: Negative.        Smokes a pack a day  Gastrointestinal: Positive for heartburn, nausea, vomiting and diarrhea.  Genitourinary: Negative.   Musculoskeletal: Positive for joint pain and neck pain.  Skin: Negative.   Neurological: Positive for dizziness, weakness and headaches.  Endo/Heme/Allergies: Negative.   Psychiatric/Behavioral: Positive for depression and substance abuse. The patient is nervous/anxious and has insomnia.     Blood pressure 122/79, pulse 66, temperature 97.9 F (36.6 C), temperature source Oral, resp. rate 19, height _0  (1.575 m), weight 65.318 kg (144 lb).Body mass index is 26.33 kg/(m^2).  General Appearance: Disheveled  Eye Sport and exercise psychologist::  Fair  Speech:  Clear and Coherent  Volume:  Decreased  Mood:  Anxious and Depressed  Affect:  Depressed  Thought Process:  Coherent and Goal Directed  Orientation:  Full (Time, Place, and Person)  Thought Content:  events, symtpoms worries concerns  Suicidal Thoughts:  No  Homicidal Thoughts:  No  Memory:  Immediate;   Fair Recent;   Fair Remote;   Fair  Judgement:  Fair  Insight:  Present  Psychomotor Activity:  Decreased  Concentration:  Fair  Recall:  [employer] of Knowledge:NA  Language: Fair  Akathisia:  No  Handed:    AIMS (if indicated):     Assets:  Desire for Improvement Housing Social Support  Sleep:  Number of Hours: 5.5    Musculoskeletal: Strength & Muscle Tone: within normal limits Gait & Station: normal Patient leans: N/A  Past Psychiatric History: Diagnosis:  Hospitalizations: [facility]  [facility]  Outpatient Care: [facility]. Used to see the people at [facility].   Substance Abuse Care: Denies  Self-Mutilation: Denies  Suicidal Attempts: Yes  Violent Behaviors: Denies   Past Medical History:   Past Medical History  Diagnosis Date  . Cirrhosis   . Degenerative joint disease   . Hypertension   . Gastric ulcer   . Depression   . Asthma   .  Mitral valve prolapse 2002  . H/O hiatal hernia   . COPD (chronic obstructive pulmonary disease)   . TIA (transient ischemic attack)     2010  . Stroke     TIA - 2010 - no deficits   . Shortness of breath     with exertion   . GERD (gastroesophageal reflux disease)   . Headache(784.0)   . Hiatal hernia 1982  . Degenerative joint disease     Allergies:   Allergies  Allergen Reactions  . Bee Venom Anaphylaxis  . Penicillins Rash   PTA Medications: Prescriptions prior to admission  Medication Sig Dispense Refill  . albuterol (PROVENTIL HFA;VENTOLIN HFA) 108 (90 BASE) MCG/ACT inhaler Inhale 2 puffs into the lungs every 6 (six) hours as needed for wheezing or shortness of breath.  1 Inhaler  5  . albuterol-ipratropium (COMBIVENT) 18-103 MCG/ACT inhaler Inhale 2 puffs into the lungs 2 (two) times daily.  1 Inhaler  5  . ARIPiprazole (ABILIFY) 5 MG tablet Take 5 mg by mouth every other day.       . Aspirin-Acetaminophen-Caffeine 262-035-59 MG PACK Take 1 packet by mouth 4 (four) times daily as needed (migraines).      Marland Kitchen FLUoxetine (PROZAC) 10 MG capsule Take 20 mg by mouth 3 (three) times daily.       Marland Kitchen gabapentin (NEURONTIN) 300 MG capsule Take 1 capsule (300 mg total) by mouth 2 (two) times daily.  60 capsule  4  . metoprolol tartrate (LOPRESSOR) 25 MG tablet Take 1 tablet (25 mg total) by mouth 2 (two) times daily.  60 tablet  3  . naproxen (NAPROSYN) 500 MG tablet Take 1 tablet (500 mg total) by mouth 2 (two) times daily with a meal. For knee pain  60 tablet  4  . traZODone (DESYREL) 100 MG tablet Take 200 mg by mouth at bedtime.        Previous Psychotropic Medications:  Medication/Dose    Prozac, Trazodone, Abilify, Neurontin             Substance Abuse History in the last 12 months:  Yes.    Consequences of Substance Abuse: Legal Consequences:  3 DWI lost license for good Blackouts:   Withdrawal Symptoms:   Diaphoresis Diarrhea Nausea Tremors  Social History:   reports that he has been smoking Cigarettes.  He started smoking about 47 years ago. He has a 22.5 pack-year smoking history. He has never used smokeless tobacco. He reports that he drinks about 3.6 ounces of alcohol per week. He reports that he does not use illicit drugs. Additional Social History: History of alcohol / drug use?: Yes Negative Consequences of Use: Legal                    Current Place of Residence:  Lives with mother and step father (states she is 97 and he has to take care of her and when he drinks he neglects her care) Place of Birth:   Family Members: Marital Status:  Widowed Children:  Sons: ( 22 when he  died)  Daughters:34 Relationships: Education:  9th grade, Clinical research associate Educational Problems/Performance: Religious Beliefs/Practices: Not currently History of Abuse (Emotional/Phsycial/Sexual) Denies Occupational Experiences; 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 Legal History: Possession of guns, 3 DWI cant get a license Hobbies/Interests:  Family History:   Family History  Problem Relation Age of Onset  . Cancer Father     bone  . Cancer Brother     lungs  . Stroke Maternal Grandmother   . Colon cancer Neg Hx   . Asthma Son     died at age 15 in his sleep   . Spina bifida Son     died at age 44   Alcoholism in family, mother depression  Results for orders placed during the hospital encounter of 03/08/14 (from the past 7 hour(s))  CBC     Status: Abnormal   Collection Time    03/08/14  3:20 PM      Result Value Ref Range   WBC 9.2  4.0 - 10.5 K/uL   RBC 4.60  4.22 - 5.81 MIL/uL   Hemoglobin 13.7  13.0 - 17.0 g/dL   HCT 41.5  39.0 - 52.0 %   MCV 90.2  78.0 - 100.0 fL   MCH 29.8  26.0 - 34.0 pg   MCHC 33.0  30.0 - 36.0 g/dL   RDW 15.8 (*) 11.5 - 15.5 %   Platelets 323  150 - 400 K/uL  COMPREHENSIVE METABOLIC PANEL     Status: Abnormal   Collection Time    03/08/14  3:20 PM      Result Value Ref Range    Sodium 139  137 - 147 mEq/L   Potassium 4.1  3.7 - 5.3 mEq/L   Chloride 100  96 - 112 mEq/L   CO2 26  19 - 32 mEq/L   Glucose, Bld 103 (*) 70 - 99 mg/dL   BUN 10  6 - 23 mg/dL   Creatinine, Ser 0.90  0.50 - 1.35 mg/dL   Calcium 8.9  8.4 - 10.5 mg/dL   Total Protein 6.9  6.0 - 8.3 g/dL   Albumin 3.8  3.5 - 5.2 g/dL   AST 20  0 - 37 U/L   ALT 14  0 - 53 U/L   Alkaline Phosphatase 67  39 - 117 U/L   Total Bilirubin <0.2 (*) 0.3 - 1.2 mg/dL   GFR calc non Af Amer >90  >90 mL/min   GFR calc Af Amer >90  >90 mL/min   Comment: (NOTE)     The eGFR has been calculated using the CKD EPI equation.     This calculation has not been validated in all clinical situations.     eGFR's persistently <90 mL/min signify possible Chronic Kidney     Disease.   Anion gap 13  5 - 15  ETHANOL     Status: None   Collection Time    03/08/14  3:20 PM      Result Value Ref Range   Alcohol, Ethyl (B) <11  0 - 11 mg/dL   Comment:            LOWEST DETECTABLE LIMIT FOR     SERUM ALCOHOL IS 11 mg/dL     FOR MEDICAL PURPOSES ONLY  URINE RAPID DRUG SCREEN (HOSP PERFORMED)     Status: None   Collection Time    03/08/14  3:21 PM      Result Value Ref Range  Opiates NONE DETECTED  NONE DETECTED   Cocaine NONE DETECTED  NONE DETECTED   Benzodiazepines NONE DETECTED  NONE DETECTED   Amphetamines NONE DETECTED  NONE DETECTED   Tetrahydrocannabinol NONE DETECTED  NONE DETECTED   Barbiturates NONE DETECTED  NONE DETECTED   Comment:            DRUG SCREEN FOR MEDICAL PURPOSES     ONLY.  IF CONFIRMATION IS NEEDED     FOR ANY PURPOSE, NOTIFY LAB     WITHIN 5 DAYS.                LOWEST DETECTABLE LIMITS     FOR URINE DRUG SCREEN     Drug Class       Cutoff (ng/mL)     Amphetamine      1000     Barbiturate      200     Benzodiazepine   559     Tricyclics       741     Opiates          300     Cocaine          300     THC              52  I-STAT TROPOININ, ED     Status: None   Collection Time     03/08/14  4:09 PM      Result Value Ref Range   Troponin i, poc 0.00  0.00 - 0.08 ng/mL   Comment 3            Comment: Due to the release kinetics of cTnI,     a negative result within the first hours     of the onset of symptoms does not rule out     myocardial infarction with certainty.     If myocardial infarction is still suspected,     repeat the test at appropriate intervals.   Psychological Evaluations:  Assessment:   DSM5:  Trauma-Stressor Disorders:  Posttraumatic Stress Disorder (309.81) Substance/Addictive Disorders:  Alcohol Related Disorder - Severe (303.90) Depressive Disorders:  Major Depressive Disorder - Severe (296.23)  AXIS I:  Substance Induced Mood Disorder AXIS II:  No diagnosis AXIS III:   Past Medical History  Diagnosis Date  . Cirrhosis   . Degenerative joint disease   . Hypertension   . Gastric ulcer   . Depression   . Asthma   . Mitral valve prolapse 2002  . H/O hiatal hernia   . COPD (chronic obstructive pulmonary disease)   . TIA (transient ischemic attack)     2010  . Stroke     TIA - 2010 - no deficits   . Shortness of breath     with exertion   . GERD (gastroesophageal reflux disease)   . Headache(784.0)   . Hiatal hernia 1982  . Degenerative joint disease    AXIS IV:  other psychosocial or environmental problems AXIS V:  41-50 serious symptoms  Treatment Plan/Recommendations:  Supportive approach/coping skills/relapse prevention                                                                 Detox as needed  Reassess and address the co morbidities                                                                 Resume the psychotropics                                                                 Consider rehab  Treatment Plan Summary: Daily contact with patient to assess and evaluate symptoms and progress in treatment Medication management Current Medications:   Current Facility-Administered Medications  Medication Dose Route Frequency Provider Last Rate Last Dose  . acetaminophen (TYLENOL) tablet 650 mg  650 mg Oral Q6H PRN Laverle Hobby, PA-C   650 mg at 03/09/14 0813  . albuterol (PROVENTIL HFA;VENTOLIN HFA) 108 (90 BASE) MCG/ACT inhaler 2 puff  2 puff Inhalation Q6H PRN Nicholaus Bloom, MD      . alum & mag hydroxide-simeth (MAALOX/MYLANTA) 200-200-20 MG/5ML suspension 30 mL  30 mL Oral Q4H PRN Laverle Hobby, PA-C      . ARIPiprazole (ABILIFY) tablet 5 mg  5 mg Oral Daily Laverle Hobby, PA-C   5 mg at 03/09/14 0809  . chlordiazePOXIDE (LIBRIUM) capsule 25 mg  25 mg Oral QID PRN Laverle Hobby, PA-C      . FLUoxetine (PROZAC) capsule 20 mg  20 mg Oral TID Laverle Hobby, PA-C   20 mg at 03/09/14 3007  . gabapentin (NEURONTIN) capsule 300 mg  300 mg Oral BID Laverle Hobby, PA-C   300 mg at 03/09/14 6226  . Ipratropium-Albuterol (COMBIVENT) respimat 1 puff  1 puff Inhalation BID Nicholaus Bloom, MD   1 puff at 03/09/14 0809  . magnesium hydroxide (MILK OF MAGNESIA) suspension 30 mL  30 mL Oral Daily PRN Laverle Hobby, PA-C      . metoprolol tartrate (LOPRESSOR) tablet 25 mg  25 mg Oral BID Laverle Hobby, PA-C   25 mg at 03/09/14 3335  . naproxen (NAPROSYN) tablet 500 mg  500 mg Oral BID WC Laverle Hobby, PA-C   500 mg at 03/09/14 4562  . traZODone (DESYREL) tablet 200 mg  200 mg Oral QHS Laverle Hobby, PA-C        Observation Level/Precautions:  15 minute checks  Laboratory:  As per ED  Psychotherapy:  Individual/group  Medications:  Ativan Detox/resume Prozac/Abilify/Neurontin/Trazodone  Consultations:    Discharge Concerns:  Need for rehab  Estimated LOS: 3-5 days  Other:     I certify that inpatient services furnished can reasonably be expected to improve the patient's condition.   Carmaleta Youngers A 8/11/20159:17 AM

## 2014-03-09 NOTE — Progress Notes (Signed)
Recreation Therapy Notes  Animal-Assisted Activity/Therapy (AAA/T) Program Checklist/Progress Notes Patient Eligibility Criteria Checklist & Daily Group note for Rec Tx Intervention  Date: 08.11.2015 Time: 2:45pm Location: [address]   AAA/T Program Assumption of Risk Form signed by Patient/ or Parent Legal Guardian yes  Patient is free of allergies or sever asthma yes  Patient reports no fear of animals yes  Patient reports no history of cruelty to animals yes   Patient understands his/her participation is voluntary yes  Patient washes hands before animal contact yes  Patient washes hands after animal contact yes  Behavioral Response: Engaged, Appropriate   Education: Hand Washing, Appropriate Animal Interaction   Education Outcome: Acknowledges understanding  Clinical Observations/Feedback: Patient interacted appropriately with therapeutic dog team and peers in session.   Laureen Ochs Shena Vinluan, LRT/CTRS  Blakley Michna L 03/09/2014 5:03 PM

## 2014-03-09 NOTE — [facility] Suicide Risk Assessment (Signed)
Suicide Risk Assessment  Admission Assessment     Nursing information obtained from:  Patient Demographic factors:  Male;Divorced or widowed;Low socioeconomic status;Unemployed Current Mental Status:  NA Loss Factors:  Decline in physical health;Financial problems / change in socioeconomic status Historical Factors:  Prior suicide attempts;Family history of mental illness or substance abuse Risk Reduction Factors:  Sense of responsibility to family;Living with another person, especially a relative Total Time spent with patient: 45 minutes  CLINICAL FACTORS:   Depression:   Comorbid alcohol abuse/dependence Alcohol/Substance Abuse/Dependencies  COGNITIVE FEATURES THAT CONTRIBUTE TO RISK:  Closed-mindedness Polarized thinking Thought constriction (tunnel vision)    SUICIDE RISK:   Moderate:  Frequent suicidal ideation with limited intensity, and duration, some specificity in terms of plans, no associated intent, good self-control, limited dysphoria/symptomatology, some risk factors present, and identifiable protective factors, including available and accessible social support.  PLAN OF CARE: Supportive approach/coping skills/relapse prevention                              Detox/reassess and address the co morbidities  I certify that inpatient services furnished can reasonably be expected to improve the patient's condition.  Undrea Shipes A 03/09/2014, 4:43 PM

## 2014-03-09 NOTE — BH Assessment (Signed)
Tele Assessment Note   Samuel Reyes is an 58 y.o. male.  -Clinician called to talk to Dr. Randal Buba about need for TTS.  She was not familiar with patient but Dr. Rockie Neighbours note indicates patient has depression and has been drinking heavily over the last month.  Patient does want to detox from ETOH.  Has been drinking a case of beer daily for the last month.  He last drank on evening of 08/09 when he drank 14 beers.  Patient denies other drug use.    Pt has been having an increase in anxiety and depression.  He has had losses over the last five years which includes the deaths of son, wife & brother.  Patient is depressed but denies SI.  He had attemtped to harm himself after the death of his son.  Pt has no HI or A/V hallucinations.    Patient is seen by psychiatrist and a counselor at [facility].  He has been at [facility] in July of '14.  Patient reports using his medication as prescribed.  -Pt care discussed with Patriciaann Clan, PA who accepted patient to services of Dr. Sabra Heck.  Patient will be signing a voluntary admission form and going to room 503-2.   Axis I: Substance Induced Mood Disorder and 204.0 ETOH use d/o severe Axis II: Deferred Axis III:  Past Medical History  Diagnosis Date  . Cirrhosis   . Degenerative joint disease   . Hypertension   . Gastric ulcer   . Depression   . Asthma   . Mitral valve prolapse 2002  . H/O hiatal hernia   . COPD (chronic obstructive pulmonary disease)   . TIA (transient ischemic attack)     2010  . Stroke     TIA - 2010 - no deficits   . Shortness of breath     with exertion   . GERD (gastroesophageal reflux disease)   . Headache(784.0)   . Hiatal hernia 1982  . Degenerative joint disease    Axis IV: economic problems and other psychosocial or environmental problems Axis V: 31-40 impairment in reality testing  Past Medical History:  Past Medical History  Diagnosis Date  . Cirrhosis   . Degenerative joint disease   . Hypertension   .  Gastric ulcer   . Depression   . Asthma   . Mitral valve prolapse 2002  . H/O hiatal hernia   . COPD (chronic obstructive pulmonary disease)   . TIA (transient ischemic attack)     2010  . Stroke     TIA - 2010 - no deficits   . Shortness of breath     with exertion   . GERD (gastroesophageal reflux disease)   . Headache(784.0)   . Hiatal hernia 1982  . Degenerative joint disease     Past Surgical History  Procedure Laterality Date  . Gastrectomy    . Shoulder surgery Bilateral     3 surgeries on on left, 2 surgeries on right   . Rt knee arthroscopic surgery    . Back surgery      3 cervical spine surgeries C4-C5 fused  . Hernia repair    . Finger surgery Left     2nd, 3rd, & 4th fingers were cut off by table saw and reattached  . Colonoscopy N/A 01/04/2014    Procedure: COLONOSCOPY;  Surgeon: Danie Binder, MD;  Location: [facility] SUITE;  Service: Endoscopy;  Laterality: N/A;  1:45  . Esophagogastroduodenoscopy N/A 01/04/2014  Procedure: ESOPHAGOGASTRODUODENOSCOPY (EGD);  Surgeon: Danie Binder, MD;  Location: [facility] ENDO SUITE;  Service: Endoscopy;  Laterality: N/A;  . Incisional hernia repair N/A 01/20/2014    Procedure: LAPAROSCOPIC RECURRENT  INCISIONAL HERNIA with mesh;  Surgeon: Edward Jolly, MD;  Location: [facility] ORS;  Service: General;  Laterality: N/A;    Family History:  Family History  Problem Relation Age of Onset  . Cancer Father     bone  . Cancer Brother     lungs  . Stroke Maternal Grandmother   . Colon cancer Neg Hx   . Asthma Son     died at age 6 in his sleep   . Spina bifida Son     died at age 34     Social History:  reports that he has been smoking Cigarettes.  He started smoking about 47 years ago. He has a 22.5 pack-year smoking history. He has never used smokeless tobacco. He reports that he drinks about 3.6 ounces of alcohol per week. He reports that he does not use illicit drugs.  Additional Social History:  Alcohol / Drug Use Pain  Medications: See PTA medication list Prescriptions: See PTA medication list Over the Counter: See PTA medication list History of alcohol / drug use?: Yes Negative Consequences of Use: Legal Withdrawal Symptoms: Nausea / Vomiting;Diarrhea;Sweats;Patient aware of relationship between substance abuse and physical/medical complications;Fever / Chills;Tremors;Blackouts Substance #1 Name of Substance 1: ETOH (beer) 1 - Age of First Use: 58 years of age 40 - Amount (size/oz): 1 case per day 1 - Frequency: Daily use 1 - Duration: Over the last month 1 - Last Use / Amount: 08/09  Drank 14 beers.  CIWA: CIWA-Ar BP: 126/79 mmHg Pulse Rate: 63 Nausea and Vomiting: no nausea and no vomiting Tactile Disturbances: none Tremor: no tremor Auditory Disturbances: not present Paroxysmal Sweats: two Visual Disturbances: not present Anxiety: no anxiety, at ease Headache, Fullness in Head: moderately severe Agitation: normal activity Orientation and Clouding of Sensorium: oriented and can do serial additions CIWA-Ar Total: 6 COWS:    PATIENT STRENGTHS: (choose at least two) Ability for insight Capable of independent living  Allergies:  Allergies  Allergen Reactions  . Bee Venom Anaphylaxis  . Penicillins Rash    Home Medications:  (Not in a hospital admission)  OB/GYN Status:  No LMP for male patient.  General Assessment Data Location of Assessment: [facility] ED Is this a Tele or Face-to-Face Assessment?: Tele Assessment Is this an Initial Assessment or a Re-assessment for this encounter?: Initial Assessment Living Arrangements: Parent (Lives with mother) Can pt return to current living arrangement?: Yes Admission Status: Voluntary Is patient capable of signing voluntary admission?: Yes Transfer from: [facility] Referral Source: Self/Family/Friend     [place] Living Arrangements: Parent (Lives with mother) Name of Psychiatrist: Beverly Sessions Name of Therapist: [facility]      Risk to self with the past 6 months Suicidal Ideation: No Suicidal Intent: No Is patient at risk for suicide?: No Suicidal Plan?: No Access to Means: No What has been your use of drugs/alcohol within the last 12 months?: ETOH use daily Previous Attempts/Gestures: Yes How many times?: 1 Other Self Harm Risks: N/A Triggers for Past Attempts: Other (Comment) (Son died 5 years ago) Intentional Self Injurious Behavior: None Family Suicide History: No Recent stressful life event(s): Financial Problems;Loss (Comment) (Son died 5 years ago; Wife died 3 yrs ago; Brother died 1.5 ) Persecutory voices/beliefs?: Yes Depression: Yes Depression Symptoms: Despondent;Isolating;Insomnia;Guilt;Feeling worthless/self pity  Substance abuse history and/or treatment for substance abuse?: Yes Suicide prevention information given to non-admitted patients: Not applicable  Risk to Others within the past 6 months Homicidal Ideation: No Thoughts of Harm to Others: No Current Homicidal Intent: No Current Homicidal Plan: No Access to Homicidal Means: No Identified Victim: No one History of harm to others?: No Assessment of Violence: None Noted Violent Behavior Description: None Does patient have access to weapons?: No Criminal Charges Pending?: No Does patient have a court date: No  Psychosis Hallucinations: None noted Delusions: None noted  Mental Status Report Appear/Hygiene: Unremarkable;In scrubs Eye Contact: Good Motor Activity: Freedom of movement;Unremarkable Speech: Logical/coherent Level of Consciousness: Alert Mood: Depressed;Anxious;Despair;Helpless Affect: Appropriate to circumstance;Anxious;Depressed Anxiety Level: Severe Thought Processes: Coherent;Relevant Judgement: Unimpaired Orientation: Person;Place;Time;Situation Obsessive Compulsive Thoughts/Behaviors: None  Cognitive Functioning Concentration: Decreased Memory: Recent Impaired;Remote Intact IQ: Average Insight:  Fair Impulse Control: Poor Appetite: Fair Weight Loss: 0 Weight Gain: 0 Sleep: Decreased Total Hours of Sleep:  (<5H/D) Vegetative Symptoms: Staying in bed;Decreased grooming  ADLScreening [facility] Assessment Services) Patient's cognitive ability adequate to safely complete daily activities?: Yes Patient able to express need for assistance with ADLs?: Yes Independently performs ADLs?: Yes (appropriate for developmental age)  Prior Inpatient Therapy Prior Inpatient Therapy: Yes Prior Therapy Dates: July '14 Prior Therapy Facilty/Provider(s): [facility] Reason for Treatment: Depression  Prior Outpatient Therapy Prior Outpatient Therapy: Yes Prior Therapy Dates: One year to current Prior Therapy Facilty/Provider(s): [facility] Reason for Treatment: Med management  ADL Screening (condition at time of admission) Patient's cognitive ability adequate to safely complete daily activities?: Yes Is the patient deaf or have difficulty hearing?: No Does the patient have difficulty seeing, even when wearing glasses/contacts?: Yes (Wears glasses) Does the patient have difficulty concentrating, remembering, or making decisions?: No Patient able to express need for assistance with ADLs?: Yes Does the patient have difficulty dressing or bathing?: No Independently performs ADLs?: Yes (appropriate for developmental age) Does the patient have difficulty walking or climbing stairs?: Yes (Right knee will give out.) Weakness of Legs: Right Weakness of Arms/Hands: None       Abuse/Neglect Assessment (Assessment to be complete while patient is alone) Physical Abuse: Denies Verbal Abuse: Denies Sexual Abuse: Denies Exploitation of patient/patient's resources: Denies Self-Neglect: Denies Values / Beliefs Cultural Requests During Hospitalization: None Spiritual Requests During Hospitalization: None   Advance Directives (For Healthcare) Advance Directive: Patient has advance directive, copy in chart Type of  Advance Directive: Living will Does patient want anything changed on advanced directive?: No change requested Pre-existing out of facility DNR order (yellow form or pink MOST form): No    Additional Information 1:1 In Past 12 Months?: No CIRT Risk: No Elopement Risk: No Does patient have medical clearance?: Yes     Disposition:  Disposition Initial Assessment Completed for this Encounter: Yes Disposition of Patient: Inpatient treatment program;Referred to Type of inpatient treatment program: Adult Patient referred to:  (Pt accepted to [facility] by Frederico Hamman to Dr. Sabra Heck.  Room 503-2)  Raymondo Band 03/09/2014 5:03 AM

## 2014-03-09 NOTE — BHH Group Notes (Signed)
[place] LCSW Group Therapy      Feelings About Diagnosis 1:15 - 2:30 PM         03/09/2014    Type of Therapy:  Group Therapy  Participation Level:  Active  Participation Quality:  Appropriate  Affect:  Appropriate  Cognitive:  Alert and Appropriate  Insight:  Developing/Improving and Engaged  Engagement in Therapy:  Developing/Improving and Engaged  Modes of Intervention:  Discussion, Education, Exploration, Problem-Solving, Rapport Building, Support  Summary of Progress/Problems:  Patient actively participated in group. Patient discussed past and present diagnosis and the effects it has had on  life.  Patient talked about family and society being judgmental and the stigma associated with having a mental health diagnosis.   Patient shared his alcohol use has lead to being a person his family cannot trust.  He shared he is hopeful going into a rehab will help to get his life back on track.  Concha Pyo 03/09/2014
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
## 2014-03-09 NOTE — Progress Notes (Addendum)
Patient ID: Samuel Reyes, male   DOB: 1956/04/22, 58 y.o.   MRN: [id number] Client is a 58 yo male admitted voluntarily for detox from ETOH, reports he has been drinking up to a case of beer a day for the last month. Client denies SHI, reports occasional voices when he drinks none currently. Client is pleasant makes eye contact, complains of chronic pain and generalized weakness with medical history of DJD, GERDS, cirrhosis, Asthma, COPD, TIA, CVA and multiple surgeries right knee, five shoulders reconstructions, gastrectomy and hernia repair. Client reports depression as "10" d/t deaths of son, wife and brother within the last five years. Client is a fall risk due to reports that legs "give out" on me sometimes most recent fall this past week at home. Client has had a previous admission in 2/15, he is interested in going to long term treatment. Writer reviewed treatment agreement, restrictive procedures, and related admission information. Client agreed to admission, offered food/drink. Client oriented to unit and room. Staff will monitor q89min for safety. Client is safe on the unit.

## 2014-03-09 NOTE — Progress Notes (Signed)
The focus of this group is to educate the patient on the purpose and policies of crisis stabilization and provide a format to answer questions about their admission.  The group details unit policies and expectations of patients while admitted.  Patient attended 0900 nurse education orientation group this morning.  Patient actively participated, appropriate affect, alert, appropriate insight and engagement.  Today patient will work on 3 goals for discharge.  

## 2014-03-09 NOTE — Progress Notes (Signed)
Patient ID: Samuel Reyes, male   DOB: [birth year], 58 y.o.   MRN: [id number] D- patient reports poor sleep last night and low energy and poor concentration a.  His appetite is good.  He is rating his depression at 8/10 and hopelessness at 6/10.  He rates anxiety at 7/10 and he denies detox symptoms except a headache and shakiness/tremors.  His goal is to feel better.A- Supported patient.  Encouraged him to attend groups and rest between groups. R-  He has been attending groups and interacting with peers.

## 2014-03-09 NOTE — ED Notes (Signed)
TTS in process 

## 2014-03-09 NOTE — Tx Team (Addendum)
Interdisciplinary Treatment Plan Update   Date Reviewed:  03/09/2014  Time Reviewed:  8:38 AM  Progress in Treatment:   Attending groups: Yes Participating in groups: Yes Taking medication as prescribed: Yes  Tolerating medication: Yes Family/Significant other contact made:  No, but will ask patient for consent for collateral contact Patient understands diagnosis: Yes  Discussing patient identified problems/goals with staff: Yes Medical problems stabilized or resolved: Yes Denies suicidal/homicidal ideation: Yes Patient has not harmed self or others: Yes  For review of initial/current patient goals, please see plan of care.  Estimated Length of Stay:  3-5 days  Reasons for Continued Hospitalization:  Anxiety Depression Medication stabilization Suicidal ideation  New Problems/Goals identified:    Discharge Plan or Barriers:   Home with outpatient follow up to be determined  Additional Comments:  Patient does want to detox from ETOH. Has been drinking a case of beer daily for the last month. He last drank on evening of 08/09 when he drank 14 beers. Patient denies other drug use.  Pt has been having an increase in anxiety and depression. He has had losses over the last five years which includes the deaths of son, wife & brother. Patient is depressed but denies SI. He had attemtped to harm himself after the death of his son. Pt has no HI or A/V hallucinations. Patient is seen by psychiatrist and a counselor at [facility]. He has been at [facility] in July of '14. Patient reports using his medication as prescribed.  Attendees:  Patient:  03/09/2014 8:38 AM   Signature:  Gypsy Balsam, MD 03/09/2014 8:38 AM  Signature:  03/09/2014 8:38 AM  Signature:  Phillis Knack, RN 03/09/2014 8:38 AM  Signature:Beverly Danelle Earthly, RN 03/09/2014 8:38 AM  Signature:  Thurnell Garbe RN 03/09/2014 8:38 AM  Signature:  Joette Catching, LCSW 03/09/2014 8:38 AM  Signature:   03/09/2014 8:38 AM  Signature:  Lucinda Dell, Care  Coordinator [facility] 03/09/2014 8:38 AM  Signature:   03/09/2014 8:38 AM  Signature:  03/09/2014  8:38 AM  Signature:   Lars Pinks, RN [facility] 03/09/2014  8:38 AM  Signature: 03/09/2014  8:38 AM    Scribe for Treatment Team:   Joette Catching,  03/09/2014 8:38 AM

## 2014-03-10 DIAGNOSIS — F1994 Other psychoactive substance use, unspecified with psychoactive substance-induced mood disorder: Secondary | ICD-10-CM

## 2014-03-10 DIAGNOSIS — F1020 Alcohol dependence, uncomplicated: Principal | ICD-10-CM

## 2014-03-10 NOTE — Progress Notes (Signed)
D: Pt denies SI/HI/AVH. Pt is pleasant and cooperative. Pt stated he was doing ok today. "no issues now"  A: Pt was offered support and encouragement. Pt was given scheduled medications. Pt was encourage to attend groups. Q 15 minute checks were done for safety.   R:Pt attends groups and interacts well with peers and staff. Pt is taking medication. Pt has no complaints at this time.Pt receptive to treatment and safety maintained on unit.

## 2014-03-10 NOTE — BHH Suicide Risk Assessment (Signed)
[place] INPATIENT:  Family/Significant Other Suicide Prevention Education  Suicide Prevention Education:  Education Completed; has been identified by the patient as the family member/significant other with whom the patient will be residing, and identified as the person(s) who will aid the patient in the event of a mental health crisis (suicidal ideations/suicide attempt).  With written consent from the patient, the family member/significant other has been provided the following suicide prevention education, prior to the and/or following the discharge of the patient  No Suicide Prevention Education provided as patient was not endorsing SI at time of admission or hospitalization.  The suicide prevention education provided includes the following:  Suicide risk factors  Suicide prevention and interventions  National Suicide Hotline telephone number  [facility] assessment telephone number  [facility] Emergency Assistance [place] and/or Residential Mobile Crisis Unit telephone number  Request made of family/significant other to:  Remove weapons (e.g., guns, rifles, knives), all items previously/currently identified as safety concern.    Remove drugs/medications (over-the-counter, prescriptions, illicit drugs), all items previously/currently identified as a safety concern.  The family member/significant other verbalizes understanding of the suicide prevention education information provided.  The family member/significant other agrees to remove the items of safety concern listed above.  Concha Pyo 03/10/2014, 8:45 AM

## 2014-03-10 NOTE — BHH Group Notes (Signed)
Adult Psychoeducational Group Note  Date:  03/10/2014 Time:  9:24 PM  Pt attended N/A meeting this evening. Pt participated and shared with the group.   Samuel Reyes 03/10/2014, 9:23 PM

## 2014-03-10 NOTE — Progress Notes (Signed)
D: Patient in the hallway on approach.  Patient states he had a much better day.  Patient states  His detox symptoms are becoming less.  Patient pleasant and cooperative. Patient denies SI/HI and denies AVH.   A: Staff to monitor Q 15 mins for safety.  Encouragement and support offered.  Scheduled medications administered per orders. R: Patient remains safe on the unit.  Patient attended group tonight.  Patient attended NA group tonight.  Patient taking administered medications.

## 2014-03-10 NOTE — Progress Notes (Signed)
[facility] MD Progress Note  03/10/2014 1:55 PM Samuel Reyes  MRN:  [id number] Subjective:  States that he starting to feel better. He will be able to go to [facility] on Monday. States he is hopeful that going there will give him the skills he needs to remain abstinent. He is still grieving but states it is getting better. States the groups have been helpful.  Diagnosis:   DSM5: Trauma-Stressor Disorders:  Posttraumatic Stress Disorder (309.81) Substance/Addictive Disorders:  Alcohol Related Disorder - Severe (303.90) Depressive Disorders:  Major Depressive Disorder - Moderate (296.22) Total Time spent with patient: 30 minutes  Axis I: Substance Induced Mood Disorder  ADL's:  Intact  Sleep: Fair  Appetite:  Fair   Psychiatric Specialty Exam: Physical Exam  Review of Systems  Constitutional: Negative.   HENT: Negative.   Eyes: Negative.   Respiratory: Negative.   Cardiovascular: Negative.   Gastrointestinal: Negative.   Genitourinary: Negative.   Musculoskeletal: Negative.   Skin: Negative.   Neurological: Negative.   Endo/Heme/Allergies: Negative.   Psychiatric/Behavioral: Positive for depression and substance abuse. The patient is nervous/anxious.     Blood pressure 147/85, pulse 88, temperature 97.9 F (36.6 C), temperature source Oral, resp. rate 18, height 5' 2"  (1.575 m), weight 65.318 kg (144 lb).Body mass index is 26.33 kg/(m^2).  General Appearance: Fairly Groomed  Engineer, water::  Fair  Speech:  Clear and Coherent  Volume:  Normal  Mood:  Anxious and Depressed  Affect:  Restricted  Thought Process:  Coherent and Goal Directed  Orientation:  Full (Time, Place, and Person)  Thought Content:  events, symtpoms worries concerns  Suicidal Thoughts:  No  Homicidal Thoughts:  No  Memory:  Immediate;   Fair Recent;   Fair Remote;   Fair  Judgement:  Fair  Insight:  Present  Psychomotor Activity:  Restlessness  Concentration:  Fair  Recall:  [employer] of Knowledge:NA   Language: Fair  Akathisia:  No  Handed:    AIMS (if indicated):     Assets:  Desire for Improvement Social Support  Sleep:  Number of Hours: 6.5   Musculoskeletal: Strength & Muscle Tone: within normal limits Gait & Station: normal Patient leans: N/A  Current Medications: Current Facility-Administered Medications  Medication Dose Route Frequency Provider Last Rate Last Dose  . acetaminophen (TYLENOL) tablet 650 mg  650 mg Oral Q6H PRN Laverle Hobby, PA-C   650 mg at 03/09/14 1646  . albuterol (PROVENTIL HFA;VENTOLIN HFA) 108 (90 BASE) MCG/ACT inhaler 2 puff  2 puff Inhalation Q6H PRN Nicholaus Bloom, MD      . alum & mag hydroxide-simeth (MAALOX/MYLANTA) 200-200-20 MG/5ML suspension 30 mL  30 mL Oral Q4H PRN Laverle Hobby, PA-C      . ARIPiprazole (ABILIFY) tablet 5 mg  5 mg Oral Daily Laverle Hobby, PA-C   5 mg at 03/10/14 0815  . FLUoxetine (PROZAC) capsule 20 mg  20 mg Oral TID Laverle Hobby, PA-C   20 mg at 03/10/14 1203  . gabapentin (NEURONTIN) capsule 300 mg  300 mg Oral BID Laverle Hobby, PA-C   300 mg at 03/10/14 8299  . hydrOXYzine (ATARAX/VISTARIL) tablet 25 mg  25 mg Oral Q6H PRN Nicholaus Bloom, MD   25 mg at 03/09/14 2205  . Ipratropium-Albuterol (COMBIVENT) respimat 1 puff  1 puff Inhalation BID Nicholaus Bloom, MD   1 puff at 03/10/14 0800  . loperamide (IMODIUM) capsule 2-4 mg  2-4 mg Oral PRN Geralyn Flash  A Myiah Petkus, MD      . LORazepam (ATIVAN) tablet 1 mg  1 mg Oral Q6H PRN Nicholaus Bloom, MD      . LORazepam (ATIVAN) tablet 1 mg  1 mg Oral TID Nicholaus Bloom, MD   1 mg at 03/10/14 1203   Followed by  . [START ON 03/11/2014] LORazepam (ATIVAN) tablet 1 mg  1 mg Oral BID Nicholaus Bloom, MD       Followed by  . [START ON 03/12/2014] LORazepam (ATIVAN) tablet 1 mg  1 mg Oral Daily Nicholaus Bloom, MD      . magnesium hydroxide (MILK OF MAGNESIA) suspension 30 mL  30 mL Oral Daily PRN Laverle Hobby, PA-C      . metoprolol tartrate (LOPRESSOR) tablet 25 mg  25 mg Oral BID  Laverle Hobby, PA-C   25 mg at 03/10/14 8453  . multivitamin with minerals tablet 1 tablet  1 tablet Oral Daily Nicholaus Bloom, MD   1 tablet at 03/10/14 6468  . naproxen (NAPROSYN) tablet 500 mg  500 mg Oral BID WC Laverle Hobby, PA-C   500 mg at 03/10/14 0813  . nicotine (NICODERM CQ - dosed in mg/24 hours) patch 21 mg  21 mg Transdermal Daily Nicholaus Bloom, MD   21 mg at 03/10/14 0813  . ondansetron (ZOFRAN-ODT) disintegrating tablet 4 mg  4 mg Oral Q6H PRN Nicholaus Bloom, MD      . thiamine (VITAMIN B-1) tablet 100 mg  100 mg Oral Daily Nicholaus Bloom, MD   100 mg at 03/10/14 0321  . traZODone (DESYREL) tablet 200 mg  200 mg Oral QHS Laverle Hobby, PA-C   200 mg at 03/09/14 2206    Lab Results:  Results for orders placed during the hospital encounter of 03/08/14 (from the past 48 hour(s))  CBC     Status: Abnormal   Collection Time    03/08/14  3:20 PM      Result Value Ref Range   WBC 9.2  4.0 - 10.5 K/uL   RBC 4.60  4.22 - 5.81 MIL/uL   Hemoglobin 13.7  13.0 - 17.0 g/dL   HCT 41.5  39.0 - 52.0 %   MCV 90.2  78.0 - 100.0 fL   MCH 29.8  26.0 - 34.0 pg   MCHC 33.0  30.0 - 36.0 g/dL   RDW 15.8 (*) 11.5 - 15.5 %   Platelets 323  150 - 400 K/uL  COMPREHENSIVE METABOLIC PANEL     Status: Abnormal   Collection Time    03/08/14  3:20 PM      Result Value Ref Range   Sodium 139  137 - 147 mEq/L   Potassium 4.1  3.7 - 5.3 mEq/L   Chloride 100  96 - 112 mEq/L   CO2 26  19 - 32 mEq/L   Glucose, Bld 103 (*) 70 - 99 mg/dL   BUN 10  6 - 23 mg/dL   Creatinine, Ser 0.90  0.50 - 1.35 mg/dL   Calcium 8.9  8.4 - 10.5 mg/dL   Total Protein 6.9  6.0 - 8.3 g/dL   Albumin 3.8  3.5 - 5.2 g/dL   AST 20  0 - 37 U/L   ALT 14  0 - 53 U/L   Alkaline Phosphatase 67  39 - 117 U/L   Total Bilirubin <0.2 (*) 0.3 - 1.2 mg/dL   GFR calc non Af Amer >90  >  90 mL/min   GFR calc Af Amer >90  >90 mL/min   Comment: (NOTE)     The eGFR has been calculated using the CKD EPI equation.     This calculation  has not been validated in all clinical situations.     eGFR's persistently <90 mL/min signify possible Chronic Kidney     Disease.   Anion gap 13  5 - 15  ETHANOL     Status: None   Collection Time    03/08/14  3:20 PM      Result Value Ref Range   Alcohol, Ethyl (B) <11  0 - 11 mg/dL   Comment:            LOWEST DETECTABLE LIMIT FOR     SERUM ALCOHOL IS 11 mg/dL     FOR MEDICAL PURPOSES ONLY  URINE RAPID DRUG SCREEN (HOSP PERFORMED)     Status: None   Collection Time    03/08/14  3:21 PM      Result Value Ref Range   Opiates NONE DETECTED  NONE DETECTED   Cocaine NONE DETECTED  NONE DETECTED   Benzodiazepines NONE DETECTED  NONE DETECTED   Amphetamines NONE DETECTED  NONE DETECTED   Tetrahydrocannabinol NONE DETECTED  NONE DETECTED   Barbiturates NONE DETECTED  NONE DETECTED   Comment:            DRUG SCREEN FOR MEDICAL PURPOSES     ONLY.  IF CONFIRMATION IS NEEDED     FOR ANY PURPOSE, NOTIFY LAB     WITHIN 5 DAYS.                LOWEST DETECTABLE LIMITS     FOR URINE DRUG SCREEN     Drug Class       Cutoff (ng/mL)     Amphetamine      1000     Barbiturate      200     Benzodiazepine   270     Tricyclics       350     Opiates          300     Cocaine          300     THC              50  I-STAT TROPOININ, ED     Status: None   Collection Time    03/08/14  4:09 PM      Result Value Ref Range   Troponin i, poc 0.00  0.00 - 0.08 ng/mL   Comment 3            Comment: Due to the release kinetics of cTnI,     a negative result within the first hours     of the onset of symptoms does not rule out     myocardial infarction with certainty.     If myocardial infarction is still suspected,     repeat the test at appropriate intervals.    Physical Findings: AIMS: Facial and Oral Movements Muscles of Facial Expression: None, normal Lips and Perioral Area: None, normal Jaw: None, normal Tongue: None, normal,Extremity Movements Upper (arms, wrists, hands, fingers): None,  normal Lower (legs, knees, ankles, toes): None, normal, Trunk Movements Neck, shoulders, hips: None, normal, Overall Severity Severity of abnormal movements (highest score from questions above): None, normal Incapacitation due to abnormal movements: None, normal Patient's awareness of abnormal movements (rate only patient's report): No Awareness,  Dental Status Current problems with teeth and/or dentures?: Yes Does patient usually wear dentures?: Yes (upper lower dentures with client)  CIWA:  CIWA-Ar Total: 0 COWS:     Treatment Plan Summary: Daily contact with patient to assess and evaluate symptoms and progress in treatment Medication management  Plan: Supportive approach/coping skills/relapse prevention           CBT;mindfulness/grief and loss           Continue detox/optmize response to psychotropics  Medical Decision Making Problem Points:  Review of psycho-social stressors (1) Data Points:  Review of medication regiment & side effects (2) Review of new medications or change in dosage (2)  I certify that inpatient services furnished can reasonably be expected to improve the patient's condition.   Linnie Delgrande A 03/10/2014, 1:55 PM

## 2014-03-10 NOTE — BHH Group Notes (Signed)
[place] LCSW Group Therapy  Emotional Regulation 1:15 - 2: 30 PM        03/10/2014     Type of Therapy:  Group Therapy  Participation Level:  Appropriate  Participation Quality:  Appropriate  Affect:  Appropriate  Cognitive:  Attentive Appropriate  Insight:  Developing/Improving Engaged  Engagement in Therapy:  Developing/Improving Engaged  Modes of Intervention:  Discussion Exploration Problem-Solving Supportive  Summary of Progress/Problems:  Group topic was emotional regulations.  Patient participated in the discussion and was able to identify an emotion that needed to regulated.  Patient stated he needs to learn how not to cre so much about others opinion.  He stated he has to change friends because he often goes along with what others want for him. Patient was able to identify approprite coping skills.  Concha Pyo 03/10/2014

## 2014-03-10 NOTE — Progress Notes (Signed)
D: Patient appropriate and cooperative with staff and peers. Patient has anxious affect and mood. He reported on the self inventory sheet that his sleep and appetite are good, ability to concentrate is poor and energy level is low. Patient is attending groups throughout the day and visible in the milieu. Patient adheres to all medications.  A: Support and encouragement provided to patient. Administered scheduled medications per ordering MD. Monitor Q15 minute checks for safety.  R: Patient receptive. Denies SI/HI/AVH. Patient remains safe on the unit.

## 2014-03-10 NOTE — BHH Group Notes (Signed)
[facility] LCSW Aftercare Discharge Planning Group Note   03/10/2014 11:17 AM    Participation Quality:  Appropraite  Mood/Affect:  Appropriate  Depression Rating:  4  Anxiety Rating:    Thoughts of Suicide:  No  Will you contract for safety?   NA  Current AVH:  No  Plan for Discharge/Comments:  Patient attended discharge planning group and actively participated in group.  He was advised Samuel Reyes will accept him for an admission assessment on Monday, March 15, 2014.  Patient was very pleased and advised of need to go home and gathered things he will need prior to presenting to [facility].  CSW provided all participants with daily work   [employer]: Patient has transportation.   Supports:  Patient has a support system.   Samuel Reyes, Samuel Reyes

## 2014-03-11 NOTE — Progress Notes (Signed)
[facility] MD Progress Note  03/11/2014 1:02 PM Samuel Reyes  MRN:  [id number] Subjective:  Samuel Reyes continues to be detox. States he is starting to sleep better. States that he still finds himself ruminating about his loses and wondering if he will ever be able to get over his loses. States he does not want to give up. He has not have significant sobriety to be able to do the work he has to do to be able to grief and as much as possible move on. He is hoping that going to [facility] this time around will help him to abstain long term. Still anxious worried but more hopeful Diagnosis:   DSM5: Trauma-Stressor Disorders:  Posttraumatic Stress Disorder (309.81) Substance/Addictive Disorders:  Alcohol Related Disorder - Severe (303.90) Depressive Disorders:  Major Depressive Disorder - Moderate (296.22) Total Time spent with patient: 30 minutes  Axis I: Substance Induced Mood Disorder  ADL's:  Intact  Sleep: Fair  Appetite:  Fair  SPsychiatric Specialty Exam: Physical Exam  Review of Systems  Constitutional: Positive for malaise/fatigue.  HENT: Negative.   Eyes: Negative.   Respiratory: Negative.   Cardiovascular: Negative.   Gastrointestinal: Negative.   Genitourinary: Negative.   Musculoskeletal: Negative.   Skin: Negative.   Neurological: Negative.   Endo/Heme/Allergies: Negative.   Psychiatric/Behavioral: Positive for depression and substance abuse. The patient is nervous/anxious.     Blood pressure 146/89, pulse 82, temperature 97.6 F (36.4 C), temperature source Oral, resp. rate 18, height 5\' 2"  (1.575 m), weight 65.318 kg (144 lb).Body mass index is 26.33 kg/(m^2).  General Appearance: Fairly Groomed  Engineer, water::  Fair  Speech:  Clear and Coherent  Volume:  Normal  Mood:  Anxious and sad, worried  Affect:  Restricted  Thought Process:  Coherent and Goal Directed  Orientation:  Full (Time, Place, and Person)  Thought Content:  symptoms events worries and concerns  Suicidal  Thoughts:  No  Homicidal Thoughts:  No  Memory:  Immediate;   Fair Recent;   Fair Remote;   Fair  Judgement:  Fair  Insight:  Present  Psychomotor Activity:  Restlessness  Concentration:  Fair  Recall:  [employer] of Knowledge:NA  Language: Fair  Akathisia:  No  Handed:    AIMS (if indicated):     Assets:  Desire for Improvement Housing Social Support  Sleep:  Number of Hours: 6.25   Musculoskeletal: Strength & Muscle Tone: within normal limits Gait & Station: normal Patient leans: N/A  Current Medications: Current Facility-Administered Medications  Medication Dose Route Frequency Provider Last Rate Last Dose  . acetaminophen (TYLENOL) tablet 650 mg  650 mg Oral Q6H PRN Laverle Hobby, PA-C   650 mg at 03/10/14 2249  . albuterol (PROVENTIL HFA;VENTOLIN HFA) 108 (90 BASE) MCG/ACT inhaler 2 puff  2 puff Inhalation Q6H PRN Nicholaus Bloom, MD      . alum & mag hydroxide-simeth (MAALOX/MYLANTA) 200-200-20 MG/5ML suspension 30 mL  30 mL Oral Q4H PRN Laverle Hobby, PA-C      . ARIPiprazole (ABILIFY) tablet 5 mg  5 mg Oral Daily Laverle Hobby, PA-C   5 mg at 03/11/14 0748  . FLUoxetine (PROZAC) capsule 20 mg  20 mg Oral TID Laverle Hobby, PA-C   20 mg at 03/11/14 1126  . gabapentin (NEURONTIN) capsule 300 mg  300 mg Oral BID Laverle Hobby, PA-C   300 mg at 03/11/14 0748  . hydrOXYzine (ATARAX/VISTARIL) tablet 25 mg  25 mg Oral Q6H PRN  Nicholaus Bloom, MD   25 mg at 03/09/14 2205  . Ipratropium-Albuterol (COMBIVENT) respimat 1 puff  1 puff Inhalation BID Nicholaus Bloom, MD   1 puff at 03/11/14 0800  . loperamide (IMODIUM) capsule 2-4 mg  2-4 mg Oral PRN Nicholaus Bloom, MD      . LORazepam (ATIVAN) tablet 1 mg  1 mg Oral Q6H PRN Nicholaus Bloom, MD   1 mg at 03/10/14 2249  . LORazepam (ATIVAN) tablet 1 mg  1 mg Oral BID Nicholaus Bloom, MD   1 mg at 03/11/14 0747   Followed by  . [START ON 03/12/2014] LORazepam (ATIVAN) tablet 1 mg  1 mg Oral Daily Nicholaus Bloom, MD      . magnesium  hydroxide (MILK OF MAGNESIA) suspension 30 mL  30 mL Oral Daily PRN Laverle Hobby, PA-C      . metoprolol tartrate (LOPRESSOR) tablet 25 mg  25 mg Oral BID Laverle Hobby, PA-C   25 mg at 03/11/14 0748  . multivitamin with minerals tablet 1 tablet  1 tablet Oral Daily Nicholaus Bloom, MD   1 tablet at 03/11/14 0747  . naproxen (NAPROSYN) tablet 500 mg  500 mg Oral BID WC Laverle Hobby, PA-C   500 mg at 03/11/14 0748  . nicotine (NICODERM CQ - dosed in mg/24 hours) patch 21 mg  21 mg Transdermal Daily Nicholaus Bloom, MD   21 mg at 03/11/14 0748  . ondansetron (ZOFRAN-ODT) disintegrating tablet 4 mg  4 mg Oral Q6H PRN Nicholaus Bloom, MD      . thiamine (VITAMIN B-1) tablet 100 mg  100 mg Oral Daily Nicholaus Bloom, MD   100 mg at 03/11/14 0747  . traZODone (DESYREL) tablet 200 mg  200 mg Oral QHS Laverle Hobby, PA-C   200 mg at 03/10/14 2250    Lab Results: No results found for this or any previous visit (from the past 48 hour(s)).  Physical Findings: AIMS: Facial and Oral Movements Muscles of Facial Expression: None, normal Lips and Perioral Area: None, normal Jaw: None, normal Tongue: None, normal,Extremity Movements Upper (arms, wrists, hands, fingers): None, normal Lower (legs, knees, ankles, toes): None, normal, Trunk Movements Neck, shoulders, hips: None, normal, Overall Severity Severity of abnormal movements (highest score from questions above): None, normal Incapacitation due to abnormal movements: None, normal Patient's awareness of abnormal movements (rate only patient's report): No Awareness, Dental Status Current problems with teeth and/or dentures?: Yes Does patient usually wear dentures?: Yes (upper lower dentures with client)  CIWA:  CIWA-Ar Total: 0 COWS:     Treatment Plan Summary: Daily contact with patient to assess and evaluate symptoms and progress in treatment Medication management  Plan: Supportive approach/coping skills/relapse prevention           Complete  the detox           Address the co morbidities/grief-loss           Optimize response to the psychotropics  Medical Decision Making Problem Points:  Review of psycho-social stressors (1) Data Points:  Review of medication regiment & side effects (2)  I certify that inpatient services furnished can reasonably be expected to improve the patient's condition.   [place] A 03/11/2014, 1:02 PM

## 2014-03-11 NOTE — Progress Notes (Signed)
Adult Psychoeducational Group Note  Date:  03/11/2014 Time:  10:16 PM  Group Topic/Focus:  Wrap-Up Group:   The focus of this group is to help patients review their daily goal of treatment and discuss progress on daily workbooks.  Participation Level:  Active  Participation Quality:  Appropriate  Affect:  Appropriate  Cognitive:  Alert and Oriented  Insight: Appropriate  Engagement in Group:  Developing/Improving  Modes of Intervention:  Exploration and Support  Additional Comments:  Patient stated that he rated his day a 7. Patient verbalized that he was feeling sluggish. Patient stated that two coping skills that he can use are talking to people and taking a nap.  Cordia Miklos, Patrick North 03/11/2014, 10:16 PM

## 2014-03-11 NOTE — Progress Notes (Signed)
D:Pt is a pleasant 58 yr old male that presents appropriate in affect and anxious in mood. Pt is denying any withdrawal symptoms. Pt is also denying any SI/HI/AVH. Pt is looking forward to going to [facility] on Monday. He plans to stay away from individuals that promote an atmosphere for him to drink.   A: Writer administered scheduled and prn medications to pt. Continued support and availability as needed was extended to this pt. Staff continue to monitor pt with q45min checks.  R: No adverse drug reactions noted. Pt receptive to treatment. Pt remains safe at this time.

## 2014-03-11 NOTE — Progress Notes (Signed)
Adult Psychoeducational Group Note  Date:  03/11/2014 Time:  9:00 AM  Group Topic/Focus:  Morning Wellness Group  Participation Level:  Active  Participation Quality:  Appropriate and Attentive  Affect:  Appropriate  Cognitive:  Alert and Oriented  Insight: Good  Engagement in Group:  Engaged  Modes of Intervention:  Discussion  Additional Comments: Patient's goal is to prepare for [facility].  Kathlen Brunswick 03/11/2014, 6:53 PM

## 2014-03-11 NOTE — BHH Group Notes (Signed)
[place] LCSW Group Therapy  [facility] 1:15 - 2:30 PM  03/11/2014   Type of Therapy:  Group Therapy  Participation Level: Active  Participation Quality:  Attentive  Affect:  Appropriate  Cognitive:  Appropriate  Insight:  Developing/Improving   Engagement in Therapy:  Developing/Improving   Modes of Intervention:  Discussion, Education, Exploration, Problem-Solving, Rapport Building, Support   Summary of Progress/Problems: Patient was attentive and engaged with speaker from [place].  Patient was attentive to speaker while they shared their story of dealing with mental health and overcoming it.  Patient expressed interest in their programs and services and received information on their agency.    Concha Pyo 03/11/2014

## 2014-03-11 NOTE — Progress Notes (Signed)
D: Patient's affect appropriate to circumstance and mood is pleasant, but anxious at times. He reported on the self inventory sheet that sleep, appetite and ability to concentrate are all good and energy level is low. Patient rates depression "6", feelings of hopelessness "4" and anxiety "5". He's interactive with peers on the hall and attending the group sessions. Compliant with medication regimen.  A: Support and encouragement provided to patient. Scheduled medications administered per MD orders. Maintain Q15 minute checks for safety.  R: Patient receptive. Denies SI/HI. Patient remains safe.

## 2014-03-12 DIAGNOSIS — F329 Major depressive disorder, single episode, unspecified: Secondary | ICD-10-CM

## 2014-03-12 DIAGNOSIS — F4310 Post-traumatic stress disorder, unspecified: Secondary | ICD-10-CM

## 2014-03-12 MED ORDER — GABAPENTIN 300 MG PO CAPS: 300.0000 mg | ORAL_CAPSULE | Freq: Two times a day (BID) | ORAL | Status: DC

## 2014-03-12 MED ORDER — METOPROLOL TARTRATE 25 MG PO TABS: 25.0000 mg | ORAL_TABLET | Freq: Two times a day (BID) | ORAL | Status: DC

## 2014-03-12 MED ORDER — FLUOXETINE HCL 20 MG PO CAPS
20.0000 mg | ORAL_CAPSULE | Freq: Three times a day (TID) | ORAL | Status: DC
Start: 2014-03-12 — End: 2014-07-27

## 2014-03-12 MED ORDER — NAPROXEN 500 MG PO TABS: 500.0000 mg | ORAL_TABLET | Freq: Two times a day (BID) | ORAL | Status: DC

## 2014-03-12 MED ORDER — TRAZODONE HCL 100 MG PO TABS: 200.0000 mg | ORAL_TABLET | Freq: Every day | ORAL | Status: DC

## 2014-03-12 MED ORDER — ALBUTEROL SULFATE HFA 108 (90 BASE) MCG/ACT IN AERS: 2.0000 | INHALATION_SPRAY | Freq: Four times a day (QID) | RESPIRATORY_TRACT | Status: DC | PRN

## 2014-03-12 MED ORDER — ARIPIPRAZOLE 5 MG PO TABS: 5.0000 mg | ORAL_TABLET | Freq: Every day | ORAL | Status: DC

## 2014-03-12 MED ORDER — IPRATROPIUM-ALBUTEROL 18-103 MCG/ACT IN AERO: 2.0000 | INHALATION_SPRAY | Freq: Two times a day (BID) | RESPIRATORY_TRACT | Status: DC

## 2014-03-12 NOTE — Progress Notes (Signed)
Discharge Note: Discharge instructions/prescriptions/medication samples given to patient. Patient verbalized understanding of discharge instructions and prescriptions. Returned belongings to patient. Denies SI/HI/AVH. Patient d/c without incident to the lobby and transported home by stepfather.

## 2014-03-12 NOTE — Tx Team (Signed)
Interdisciplinary Treatment Plan Update   Date Reviewed:  03/12/2014  Time Reviewed:  8:41 AM  Progress in Treatment:   Attending groups: Yes Participating in groups: Yes Taking medication as prescribed: Yes  Tolerating medication: Yes Family/Significant other contact made:  No, patient is discharging to facility and was no endorsing SI on or during   admission Patient understands diagnosis: Yes  Discussing patient identified problems/goals with staff: Yes Medical problems stabilized or resolved: Yes Denies suicidal/homicidal ideation: Yes Patient has not harmed self or others: Yes  For review of initial/current patient goals, please see plan of care.  Estimated Length of Stay:  Discharge today  Reasons for Continued Hospitalization:   New Problems/Goals identified:    Discharge Plan or Barriers:   Home with outpatient follow up with [facility]  Additional Comments:  Attendees:  Patient:  03/12/2014 8:41 AM   Signature:  Agustina Caroli, NP  03/12/2014 8:41 AM  Signature: Edwyna Shell, Lead LCSW 03/12/2014 8:41 AM  Signature:  Eduard Roux, RN 03/12/2014 8:41 AM  Signature:  Patrecia Pace, RN 03/12/2014 8:41 AM  Signature:   Drake Leach, RN 03/12/2014 8:41 AM  Signature:  Joette Catching, LCSW 03/12/2014 8:41 AM  Signature:  Cyril Mourning Drinkard, LCSW-A 03/12/2014 8:41 AM  Signature:  Lucinda Dell, Care Coordinator [facility] 03/12/2014 8:41 AM  Signature:  Maxie Better, LCSW-A 03/12/2014 8:41 AM  Signature:  03/12/2014  8:41 AM  Signature:  03/12/2014  8:41 AM  Signature: 03/12/2014  8:41 AM    Scribe for Treatment Team:   Joette Catching,  03/12/2014 8:41 AM

## 2014-03-12 NOTE — Progress Notes (Signed)
[facility] Adult Case Management Discharge Plan :  Will you be returning to the same living situation after discharge: Yes,  Patient is d/cing home with family. Scheduled with [facility]  on 03/15/14. At discharge, do you have transportation home?:Yes,  Family to transport patient. Do you have the ability to pay for your medications:Yes,   Patient has Medicaid.   Release of information consent forms completed and in the chart;  Patient's signature needed at discharge.  Patient to Follow up at: Follow-up Information   Follow up with [facility] On 03/15/2014. (Monday, March 15, 2014 at [place])    Contact information:   [address] [place], [place]   [address]  [phone]      Patient denies SI/HI: Patient no longer endorsing SI/HI or other thoughts of self harm.   Safety Planning and Suicide Prevention discussed: .Reviewed with all patients during discharge planning group  Meesha Sek, Eulas Post 03/12/2014, 10:34 AM

## 2014-03-12 NOTE — Discharge Summary (Signed)
Physician Discharge Summary Note  Patient:  Samuel Reyes is an 58 y.o., male MRN:  [id number] DOB:  22-Jun-1956 Patient phone:  [phone] (home)  Patient address:   [place] [address],  Total Time spent with patient: Greater than 30 minutes  Date of Admission:  03/09/2014  Date of Discharge: 03/12/14  Reason for Admission:  Alcohol detox  Discharge Diagnoses: Active Problems:   Alcohol dependence   Major depression, recurrent   PTSD (post-traumatic stress disorder)   Psychiatric Specialty Exam: Physical Exam  Psychiatric: His speech is normal and behavior is normal. Judgment and thought content normal. His mood appears not anxious. His affect is not angry, not blunt, not labile and not inappropriate. Cognition and memory are normal. He does not exhibit a depressed mood.    Review of Systems  Constitutional: Negative.   HENT: Negative.   Eyes: Negative.   Respiratory: Negative.   Cardiovascular: Negative.   Gastrointestinal: Negative.   Genitourinary: Negative.   Musculoskeletal: Negative.   Skin: Negative.   Neurological: Negative.   Endo/Heme/Allergies: Negative.   Psychiatric/Behavioral: Positive for depression (Stable) and substance abuse (Alcoholism, chronic). Negative for suicidal ideas, hallucinations and memory loss. The patient has insomnia (Stable). The patient is not nervous/anxious.     Blood pressure 126/75, pulse 83, temperature 97.7 F (36.5 C), temperature source Oral, resp. rate 20, height 5\' 2"  (1.575 m), weight 65.318 kg (144 lb).Body mass index is 26.33 kg/(m^2).  General Appearance: Fairly Groomed  Engineer, water:: Fair  Speech: Clear and Coherent  Volume: Normal  Mood: Euthymic  Affect: Appropriate  Thought Process: Coherent and Goal Directed  Orientation: Full (Time, Place, and Person)  Thought Content: plans as he moves on, relapse prevention plan  Suicidal Thoughts: No  Homicidal Thoughts: No Memory: Immediate; Fair   Recent; Fair  Remote; Fair  Judgement: Fair  Insight: Present  Psychomotor Activity: Normal  Concentration: Fair  Recall: [employer] of Knowledge:NA  Language: Fair  Akathisia: No  Handed:  AIMS (if indicated): Assets: Desire for Improvement  Housing  Social Support  Sleep: Number of Hours: 6.25   Past Psychiatric History: Diagnosis: Alcohol dependence, Major depression, recurrent, PTSD  Hospitalizations: [facility]  Outpatient Care: [facility]  Substance Abuse Care: [facility]  Self-Mutilation: NA  Suicidal Attempts: NA  Violent Behaviors: NA   Musculoskeletal: Strength & Muscle Tone: within normal limits Gait & Station: normal Patient leans: N/A  DSM5: Schizophrenia Disorders:  NA Obsessive-Compulsive Disorders:  NA Trauma-Stressor Disorders:  Posttraumatic Stress Disorder (309.81) Substance/Addictive Disorders:  Alcohol Related Disorder - Severe (303.90) Depressive Disorders:  Major depression, recurrent  Axis Diagnosis:  AXIS I:  Alcohol dependence, Major depression, recurrent, PTSD AXIS II:  Deferred AXIS III:   Past Medical History  Diagnosis Date  . Cirrhosis   . Degenerative joint disease   . Hypertension   . Gastric ulcer   . Depression   . Asthma   . Mitral valve prolapse 2002  . H/O hiatal hernia   . COPD (chronic obstructive pulmonary disease)   . TIA (transient ischemic attack)     2010  . Stroke     TIA - 2010 - no deficits   . Shortness of breath     with exertion   . GERD (gastroesophageal reflux disease)   . Headache(784.0)   . Hiatal hernia 1982  . Degenerative joint disease    AXIS IV:  other psychosocial or environmental problems and Alcoholism, chronic AXIS V:  62  Level of Care:  OP  Hospital Course:  58 Y/O male who states too much stuff has been happening to him in the last few years. States he has been missing all that he had back in [place]. He was admitted to this unit in July 4 and D/C February 04, 2013.  After he left he did "real good for 3-4 months" states some new people moved accorss the street and they like to party. Started drinking again. States it build up to a case a day.  Samuel Reyes was admitted to the hospital with his alcohol detoxification treatment. He reports having been drinking again after 3 to 4 months sobriety. His detoxification treatment was achieved using Ativan detox regimen on a tapering dose format. This was used in place of Librium detox protocol because Librium is a long acting benzodiazepine with a long half life. This way, Samuel Reyes received a cleaner detoxification treatment without the lingering effects of Librium capsules. He was also enrolled in the group counseling sessions, AA/NA meetings being offered and held on this unit. He participated and learned coping skills. Samuel Reyes received other medication regimen for his other medical conditions presented. He tolerated his treatment regimen without any significant adverse effects and or reactions.  Besides the treatments received here and scheduled further substance abuse treatment, Samuel Reyes also was ordered, received and discharged on Trazodone 100 mg at bedtime for sleep, Prozac 20 mg daily for depression, Abilify 5 mg for mood control and Neurontin 300 mg twice daily for substance withdrawal syndrome. He was resumed on his pertinent home medications for his other medical issues. He tolerated his treatment regimen without any withdrawal symptoms.  Samuel Reyes has completed detox treatment and his mood is stable. This is evidenced by his reports of improved mood and absence of substance withdrawal symptoms. He will resume psychiatric care and routine medication management and further substance abuse treatments at the [facility] on 03/15/14. He is provided with all the necessary information required to make these appointments without problems.  Upon discharge, Samuel Reyes adamantly denies any suicidal, homicidal  ideations, auditory, visual hallucinations, delusional thoughts, paranoia and or withdrawal symptoms. He left [facility] with all personal belongings in no apparent distress. He received 4 days worth supply samples of his discharge medications provided by [facility] pharmacy. Transportation per family.  Consults:  psychiatry  Significant Diagnostic Studies:  labs: CBC with diff, CMP, UDS, toxicology tests, U/A  Discharge Vitals:   Blood pressure 126/75, pulse 83, temperature 97.7 F (36.5 C), temperature source Oral, resp. rate 20, height 5\' 2"  (1.575 m), weight 65.318 kg (144 lb). Body mass index is 26.33 kg/(m^2). Lab Results:   No results found for this or any previous visit (from the past 72 hour(s)).  Physical Findings: AIMS: Facial and Oral Movements Muscles of Facial Expression: None, normal Lips and Perioral Area: None, normal Jaw: None, normal Tongue: None, normal,Extremity Movements Upper (arms, wrists, hands, fingers): None, normal Lower (legs, knees, ankles, toes): None, normal, Trunk Movements Neck, shoulders, hips: None, normal, Overall Severity Severity of abnormal movements (highest score from questions above): None, normal Incapacitation due to abnormal movements: None, normal Patient's awareness of abnormal movements (rate only patient's report): No Awareness, Dental Status Current problems with teeth and/or dentures?: Yes Does patient usually wear dentures?: Yes (upper lower dentures with client)  CIWA:  CIWA-Ar Total: 0 COWS:     Psychiatric Specialty Exam: See Psychiatric Specialty Exam and Suicide Risk Assessment completed by Attending Physician prior to discharge.  Discharge destination:  Other:  Home, then to [facility]  Is patient on multiple antipsychotic therapies at discharge:  No   Has Patient had three or more failed trials of antipsychotic monotherapy by history:  No  Recommended Plan for Multiple Antipsychotic Therapies: NA     Medication List     STOP taking these medications       Aspirin-Acetaminophen-Caffeine 500-325-65 MG Pack      TAKE these medications     Indication   albuterol 108 (90 BASE) MCG/ACT inhaler  Commonly known as:  PROVENTIL HFA;VENTOLIN HFA  Inhale 2 puffs into the lungs every 6 (six) hours as needed for wheezing or shortness of breath.   Indication:  Asthma     albuterol-ipratropium 18-103 MCG/ACT inhaler  Commonly known as:  COMBIVENT  Inhale 2 puffs into the lungs 2 (two) times daily. For shortness of breath   Indication:  Disease involving Spasms of the Lungs     ARIPiprazole 5 MG tablet  Commonly known as:  ABILIFY  Take 1 tablet (5 mg total) by mouth daily. For mood control   Indication:  Mood control     FLUoxetine 20 MG capsule  Commonly known as:  PROZAC  Take 1 capsule (20 mg total) by mouth 3 (three) times daily. For depression   Indication:  Depression     gabapentin 300 MG capsule  Commonly known as:  NEURONTIN  Take 1 capsule (300 mg total) by mouth 2 (two) times daily. For substance withdrawal syndrome   Indication:  Substance withdrawal syndrome     metoprolol tartrate 25 MG tablet  Commonly known as:  LOPRESSOR  Take 1 tablet (25 mg total) by mouth 2 (two) times daily. For high blood pressure   Indication:  High Blood Pressure     naproxen 500 MG tablet  Commonly known as:  NAPROSYN  Take 1 tablet (500 mg total) by mouth 2 (two) times daily with a meal. For knee pain   Indication:  Mild to Moderate Pain     traZODone 100 MG tablet  Commonly known as:  DESYREL  Take 2 tablets (200 mg total) by mouth at bedtime. For sleep   Indication:  Trouble Sleeping       Follow-up Information   Follow up with [facility] On 03/15/2014. (Monday, March 15, 2014 at [facility])    Contact information:   [address] [place], [place]   [address]  [phone]     Follow-up recommendations: Activity:  As tolerated Diet: As recommended by your primary care doctor. Keep all  scheduled follow-up appointments as recommended.   Comments: Take all your medications as prescribed by your mental healthcare provider. Report any adverse effects and or reactions from your medicines to your outpatient provider promptly. Patient is instructed and cautioned to not engage in alcohol and or illegal drug use while on prescription medicines. In the event of worsening symptoms, patient is instructed to call the crisis hotline, 911 and or go to the nearest ED for appropriate evaluation and treatment of symptoms. Follow-up with your primary care provider for your other medical issues, concerns and or health care needs.   Total Discharge Time:  Greater than 30 minutes.  Signed: Encarnacion Slates, [place] 03/13/2014, 2:15 PM I personally assessed the patient and formulated the plan Geralyn Flash A. Sabra Heck, M.D.

## 2014-03-12 NOTE — [facility] Suicide Risk Assessment (Signed)
Suicide Risk Assessment  Discharge Assessment     Demographic Factors:  Male, Divorced or widowed and Caucasian  Total Time spent with patient: 30 minutes  Psychiatric Specialty Exam:     Blood pressure 126/75, pulse 83, temperature 97.7 F (36.5 C), temperature source Oral, resp. rate 20, height 5\' 2"  (1.575 m), weight 65.318 kg (144 lb).Body mass index is 26.33 kg/(m^2).  General Appearance: Fairly Groomed  Engineer, water::  Fair  Speech:  Clear and Coherent  Volume:  Normal  Mood:  Euthymic  Affect:  Appropriate  Thought Process:  Coherent and Goal Directed  Orientation:  Full (Time, Place, and Person)  Thought Content:  plans as he moves on, relapse prevention plan  Suicidal Thoughts:  No  Homicidal Thoughts:  No  Memory:  Immediate;   Fair Recent;   Fair Remote;   Fair  Judgement:  Fair  Insight:  Present  Psychomotor Activity:  Normal  Concentration:  Fair  Recall:  [employer] of Knowledge:NA  Language: Fair  Akathisia:  No  Handed:    AIMS (if indicated):     Assets:  Desire for Improvement Housing Social Support  Sleep:  Number of Hours: 6.25    Musculoskeletal: Strength & Muscle Tone: within normal limits Gait & Station: normal Patient leans: N/A   Mental Status Per Nursing Assessment::   On Admission:  NA  Current Mental Status by Physician: In full contact with reality. There are no active S/S of withdrawal. His mood is euthymic affect is brighter. He admits he has work to do in terms of his grief. States that he knows his wife and son will not want him to drink himself to death.    Loss Factors: Loss of significant relationship  Historical Factors: NA  Risk Reduction Factors:   Sense of responsibility to family, Living with another person, especially a relative and Positive social support  Continued Clinical Symptoms:  Depression:   Comorbid alcohol abuse/dependence Alcohol/Substance Abuse/Dependencies  Cognitive Features That Contribute  To Risk:  Closed-mindedness Polarized thinking Thought constriction (tunnel vision)    Suicide Risk:  Minimal: No identifiable suicidal ideation.  Patients presenting with no risk factors but with morbid ruminations; may be classified as minimal risk based on the severity of the depressive symptoms  Discharge Diagnoses:   AXIS I:  Alcohol Dependence, S/P alcohol withdrawal, Major Depression recurrent, PTSD AXIS II:  No diagnosis AXIS III:   Past Medical History  Diagnosis Date  . Cirrhosis   . Degenerative joint disease   . Hypertension   . Gastric ulcer   . Depression   . Asthma   . Mitral valve prolapse 2002  . H/O hiatal hernia   . COPD (chronic obstructive pulmonary disease)   . TIA (transient ischemic attack)     2010  . Stroke     TIA - 2010 - no deficits   . Shortness of breath     with exertion   . GERD (gastroesophageal reflux disease)   . Headache(784.0)   . Hiatal hernia 1982  . Degenerative joint disease    AXIS IV:  other psychosocial or environmental problems AXIS V:  61-70 mild symptoms  Plan Of Care/Follow-up recommendations:  Activity:  as tolerated Diet:  regular Follow up [facility] Monday AM Is patient on multiple antipsychotic therapies at discharge:  No   Has Patient had three or more failed trials of antipsychotic monotherapy by history:  No  Recommended Plan for Multiple Antipsychotic Therapies: NA  Samuel Reyes A 03/12/2014, 9:05 AM

## 2014-03-12 NOTE — [facility] Group Notes (Signed)
[facility] LCSW Aftercare Discharge Planning Group Note   03/12/2014 10:33 AM    Participation Quality:  Appropraite  Mood/Affect:  Appropriate  Depression Rating:  4  Anxiety Rating:  5  Thoughts of Suicide:  No  Will you contract for safety?   NA  Current AVH:  No  Plan for Discharge/Comments:  Patient attended discharge planning group and actively participated in group.  Patient reports being ready for discharge today.  He will follow up with [facility].  CSW provided all participants with daily workbook.   Transportation Means: Patient has transportation.   Supports:  Patient has a support system.   Shaterica Mcclatchy, [place] Post

## 2014-03-17 NOTE — Progress Notes (Signed)
Patient Discharge Instructions:  After Visit Summary (AVS):   Faxed to:  03/17/14 Discharge Summary Note:   Faxed to:  03/17/14 Psychiatric Admission Assessment Note:   Faxed to:  03/17/14 Suicide Risk Assessment - Discharge Assessment:   Faxed to:  03/17/14 Faxed/Sent to the Next Level Care provider:  03/17/14 Faxed to [facility], 03/17/2014, 3:39 PM

## 2014-04-07 ENCOUNTER — Telehealth: Payer: Self-pay | Admitting: Internal Medicine

## 2014-04-07 NOTE — Telephone Encounter (Signed)
Pt. Called asking for an appt. With his PCP first available appt. Is next Tuesday...Marland KitchenMarland Kitchenappointment was scheduled and patient was advised to go to Urgent Care  And seek medical attention for his chest pain and throwing up he has been experiencing the past three days.

## 2014-04-13 ENCOUNTER — Ambulatory Visit: Payer: Self-pay | Admitting: Internal Medicine

## 2014-04-14 ENCOUNTER — Emergency Department (HOSPITAL_COMMUNITY)
Admission: EM | Admit: 2014-04-14 | Discharge: 2014-04-15 | Disposition: A | Discharge status: Other Acute Inpatient Hospital | Payer: Medicaid Other | Attending: Emergency Medicine | Admitting: Emergency Medicine

## 2014-04-14 ENCOUNTER — Emergency Department (HOSPITAL_COMMUNITY): Payer: Medicaid Other

## 2014-04-14 ENCOUNTER — Encounter (HOSPITAL_COMMUNITY): Payer: Self-pay | Admitting: Emergency Medicine

## 2014-04-14 DIAGNOSIS — F3289 Other specified depressive episodes: Secondary | ICD-10-CM | POA: Insufficient documentation

## 2014-04-14 DIAGNOSIS — E872 Acidosis, unspecified: Secondary | ICD-10-CM | POA: Insufficient documentation

## 2014-04-14 DIAGNOSIS — Z8739 Personal history of other diseases of the musculoskeletal system and connective tissue: Secondary | ICD-10-CM | POA: Diagnosis not present

## 2014-04-14 DIAGNOSIS — F17200 Nicotine dependence, unspecified, uncomplicated: Secondary | ICD-10-CM | POA: Insufficient documentation

## 2014-04-14 DIAGNOSIS — F329 Major depressive disorder, single episode, unspecified: Secondary | ICD-10-CM | POA: Insufficient documentation

## 2014-04-14 DIAGNOSIS — I10 Essential (primary) hypertension: Secondary | ICD-10-CM | POA: Insufficient documentation

## 2014-04-14 DIAGNOSIS — F10239 Alcohol dependence with withdrawal, unspecified: Secondary | ICD-10-CM | POA: Diagnosis not present

## 2014-04-14 DIAGNOSIS — K219 Gastro-esophageal reflux disease without esophagitis: Secondary | ICD-10-CM | POA: Diagnosis not present

## 2014-04-14 DIAGNOSIS — Z880 Allergy status to penicillin: Secondary | ICD-10-CM | POA: Diagnosis not present

## 2014-04-14 DIAGNOSIS — J449 Chronic obstructive pulmonary disease, unspecified: Secondary | ICD-10-CM | POA: Insufficient documentation

## 2014-04-14 DIAGNOSIS — Z79899 Other long term (current) drug therapy: Secondary | ICD-10-CM | POA: Insufficient documentation

## 2014-04-14 DIAGNOSIS — R079 Chest pain, unspecified: Secondary | ICD-10-CM | POA: Diagnosis not present

## 2014-04-14 DIAGNOSIS — F10939 Alcohol use, unspecified with withdrawal, unspecified: Secondary | ICD-10-CM | POA: Insufficient documentation

## 2014-04-14 DIAGNOSIS — R000 Tachycardia, unspecified: Secondary | ICD-10-CM | POA: Diagnosis not present

## 2014-04-14 DIAGNOSIS — Z8673 Personal history of transient ischemic attack (TIA), and cerebral infarction without residual deficits: Secondary | ICD-10-CM | POA: Insufficient documentation

## 2014-04-14 DIAGNOSIS — Z791 Long term (current) use of non-steroidal anti-inflammatories (NSAID): Secondary | ICD-10-CM | POA: Diagnosis not present

## 2014-04-14 DIAGNOSIS — F1020 Alcohol dependence, uncomplicated: Secondary | ICD-10-CM | POA: Diagnosis not present

## 2014-04-14 DIAGNOSIS — F10230 Alcohol dependence with withdrawal, uncomplicated: Secondary | ICD-10-CM

## 2014-04-14 DIAGNOSIS — E8729 Other acidosis: Secondary | ICD-10-CM

## 2014-04-14 DIAGNOSIS — J4489 Other specified chronic obstructive pulmonary disease: Secondary | ICD-10-CM | POA: Insufficient documentation

## 2014-04-14 LAB — PRO B NATRIURETIC PEPTIDE: Pro B Natriuretic peptide (BNP): 49.4 pg/mL (ref 0–125)

## 2014-04-14 LAB — BASIC METABOLIC PANEL
ANION GAP: 21 — AB (ref 5–15)
BUN: 4 mg/dL — ABNORMAL LOW (ref 6–23)
CALCIUM: 9.1 mg/dL (ref 8.4–10.5)
CO2: 21 meq/L (ref 19–32)
Chloride: 92 mEq/L — ABNORMAL LOW (ref 96–112)
Creatinine, Ser: 0.67 mg/dL (ref 0.50–1.35)
GFR calc Af Amer: >90 mL/min (ref >90)
GFR calc non Af Amer: >90 mL/min (ref >90)
Glucose, Bld: 80 mg/dL (ref 70–99)
POTASSIUM: 3.6 meq/L — AB (ref 3.7–5.3)
SODIUM: 134 meq/L — AB (ref 137–147)

## 2014-04-14 LAB — CBC
HCT: 38.6 % — ABNORMAL LOW (ref 39.0–52.0)
Hemoglobin: 13.5 g/dL (ref 13.0–17.0)
MCH: 30.2 pg (ref 26.0–34.0)
MCHC: 35 g/dL (ref 30.0–36.0)
MCV: 86.4 fL (ref 78.0–100.0)
Platelets: 347 10*3/uL (ref 150–400)
RBC: 4.47 MIL/uL (ref 4.22–5.81)
RDW: 16.3 % — AB (ref 11.5–15.5)
WBC: 10.4 10*3/uL (ref 4.0–10.5)

## 2014-04-14 LAB — ETHANOL: ALCOHOL ETHYL (B): 161 mg/dL — AB (ref 0–11)

## 2014-04-14 LAB — I-STAT TROPONIN, ED
TROPONIN I, POC: 0.01 ng/mL (ref 0.00–0.08)
Troponin i, poc: 0 ng/mL (ref 0.00–0.08)

## 2014-04-14 MED ORDER — SODIUM CHLORIDE 0.9 % IV BOLUS (SEPSIS): 1000.0000 mL | Freq: Once | INTRAVENOUS | Status: DC

## 2014-04-14 MED ORDER — SODIUM CHLORIDE 0.9 % IV BOLUS (SEPSIS)
1000.0000 mL | Freq: Once | INTRAVENOUS | Status: AC
  Administered 2014-04-14: 1000 mL via INTRAVENOUS

## 2014-04-14 MED ORDER — LORAZEPAM 1 MG PO TABS
0.0000 mg | ORAL_TABLET | Freq: Two times a day (BID) | ORAL | Status: DC
  Administered 2014-04-14 – 2014-04-15 (×2): 2 mg via ORAL
  Filled 2014-04-14 (×2): qty 2

## 2014-04-14 MED ORDER — LORAZEPAM 2 MG/ML IJ SOLN: 0.0000 mg | Freq: Four times a day (QID) | INTRAMUSCULAR | Status: DC

## 2014-04-14 MED ORDER — ASPIRIN 81 MG PO CHEW
324.0000 mg | CHEWABLE_TABLET | Freq: Once | ORAL | Status: AC
  Administered 2014-04-14: 324 mg via ORAL
  Filled 2014-04-14: qty 4

## 2014-04-14 MED ORDER — VITAMIN B-1 100 MG PO TABS
100.0000 mg | ORAL_TABLET | Freq: Every day | ORAL | Status: DC
  Administered 2014-04-14 – 2014-04-15 (×2): 100 mg via ORAL
  Filled 2014-04-14 (×2): qty 1

## 2014-04-14 MED ORDER — THIAMINE HCL 100 MG/ML IJ SOLN: 100.0000 mg | Freq: Every day | INTRAMUSCULAR | Status: DC

## 2014-04-14 MED ORDER — LORAZEPAM 2 MG/ML IJ SOLN: 0.0000 mg | Freq: Two times a day (BID) | INTRAMUSCULAR | Status: DC

## 2014-04-14 MED ORDER — ADULT MULTIVITAMIN W/MINERALS CH
1.0000 | ORAL_TABLET | Freq: Once | ORAL | Status: AC
  Administered 2014-04-14: 1 via ORAL
  Filled 2014-04-14: qty 1

## 2014-04-14 MED ORDER — LORAZEPAM 1 MG PO TABS: 0.0000 mg | ORAL_TABLET | Freq: Four times a day (QID) | ORAL | Status: DC

## 2014-04-14 NOTE — ED Notes (Addendum)
Pt reports having chest pains today, has been drinking etoh heavily x 4 days and requesting detox. HR 130 at triage.

## 2014-04-14 NOTE — ED Notes (Signed)
Patient transported to X-ray 

## 2014-04-14 NOTE — ED Provider Notes (Signed)
CSN: [id number]     Arrival date & time 04/14/14  1846 History   First MD Initiated Contact with Patient 04/14/14 1932     Chief Complaint  Patient presents with  . Chest Pain  . Alcohol Problem    Patient is a 58 y.o. male presenting with chest pain. The history is provided by the patient.  Chest Pain Pain location:  Substernal area Pain quality: pressure (bowling bowl)   Pain radiates to:  L arm Pain radiates to the back: no   Pain severity now: 8/10. Onset quality:  Sudden Duration:  12 hours Timing:  Constant Progression:  Unchanged Chronicity:  Recurrent Context: breathing and at rest   Context: not eating, not lifting, not raising an arm and no trauma   Context comment:  Sneezing Relieved by:  Nothing Worsened by:  Coughing and deep breathing (palpation) Ineffective treatments: pepto bismol prilosec. Associated symptoms: cough (chronic), dysphagia, fever (100), nausea (a few days from drinking), shortness of breath (chronic) and vomiting (from drinking, alot in past few days)   Associated symptoms: no abdominal pain, no back pain, no heartburn and no lower extremity edema   Cough:    Sputum characteristics:  Yellow Risk factors: hypertension   Risk factors: no coronary artery disease, no diabetes mellitus and no prior DVT/PE    ETOH cirrhosis, drinking 12 pack of beer per day.  No hx of detox, seizures, DT. Has been hospitalized 3 months ago for ETOH WD.  Wants to quit. Last drink was 9AM, used beer. No other alcohols. Has not eaten this week, only drinking beer.   Past Medical History  Diagnosis Date  . Cirrhosis   . Degenerative joint disease   . Hypertension   . Gastric ulcer   . Depression   . Asthma   . Mitral valve prolapse 2002  . H/O hiatal hernia   . COPD (chronic obstructive pulmonary disease)   . TIA (transient ischemic attack)     2010  . Stroke     TIA - 2010 - no deficits   . Shortness of breath     with exertion   . GERD (gastroesophageal  reflux disease)   . Headache(784.0)   . Hiatal hernia 1982  . Degenerative joint disease    Past Surgical History  Procedure Laterality Date  . Gastrectomy    . Shoulder surgery Bilateral     3 surgeries on on left, 2 surgeries on right   . Rt knee arthroscopic surgery    . Back surgery      3 cervical spine surgeries C4-C5 fused  . Hernia repair    . Finger surgery Left     2nd, 3rd, & 4th fingers were cut off by table saw and reattached  . Colonoscopy N/A 01/04/2014    Procedure: COLONOSCOPY;  Surgeon: Danie Binder, MD;  [facility];  Service: Endoscopy;  Laterality: N/A;  1:45  . Esophagogastroduodenoscopy N/A 01/04/2014    Procedure: ESOPHAGOGASTRODUODENOSCOPY (EGD);  Surgeon: Danie Binder, MD;  [facility];  Service: Endoscopy;  Laterality: N/A;  . Incisional hernia repair N/A 01/20/2014    Procedure: LAPAROSCOPIC RECURRENT  INCISIONAL HERNIA with mesh;  Surgeon: Edward Jolly, MD;  Location: [facility];  Service: General;  Laterality: N/A;   Family History  Problem Relation Age of Onset  . Cancer Father     bone  . Cancer Brother     lungs  . Stroke Maternal Grandmother   .  Colon cancer Neg Hx   . Asthma Son     died at age 59 in his sleep   . Spina bifida Son     died at age 33    History  Substance Use Topics  . Smoking status: Current Every Day Smoker -- 0.50 packs/day for 45 years    Types: Cigarettes    Start date: 07/30/1966  . Smokeless tobacco: Never Used  . Alcohol Use: 3.6 oz/week    6 Cans of beer per week     Comment: couple of beers at a time 2-3 times weekly     Review of Systems  Constitutional: Positive for fever (100).  HENT: Positive for trouble swallowing.   Respiratory: Positive for cough (chronic) and shortness of breath (chronic).   Cardiovascular: Positive for chest pain.  Gastrointestinal: Positive for nausea (a few days from drinking) and vomiting (from drinking, alot in past few days). Negative for heartburn  and abdominal pain.  Musculoskeletal: Negative for back pain.      Allergies  Bee venom and Penicillins  Home Medications   Prior to Admission medications   Medication Sig Start Date End Date Taking? Authorizing Provider  albuterol (PROVENTIL HFA;VENTOLIN HFA) 108 (90 BASE) MCG/ACT inhaler Inhale 2 puffs into the lungs every 6 (six) hours as needed for wheezing or shortness of breath. 03/12/14   Encarnacion Slates, NP  albuterol-ipratropium (COMBIVENT) 18-103 MCG/ACT inhaler Inhale 2 puffs into the lungs 2 (two) times daily. For shortness of breath 03/12/14   Encarnacion Slates, NP  ARIPiprazole (ABILIFY) 5 MG tablet Take 1 tablet (5 mg total) by mouth daily. For mood control 03/12/14   Encarnacion Slates, NP  FLUoxetine (PROZAC) 20 MG capsule Take 1 capsule (20 mg total) by mouth 3 (three) times daily. For depression 03/12/14   Encarnacion Slates, NP  gabapentin (NEURONTIN) 300 MG capsule Take 1 capsule (300 mg total) by mouth 2 (two) times daily. For substance withdrawal syndrome 03/12/14   Encarnacion Slates, NP  metoprolol tartrate (LOPRESSOR) 25 MG tablet Take 1 tablet (25 mg total) by mouth 2 (two) times daily. For high blood pressure 03/12/14   Encarnacion Slates, NP  naproxen (NAPROSYN) 500 MG tablet Take 1 tablet (500 mg total) by mouth 2 (two) times daily with a meal. For knee pain 03/12/14   Encarnacion Slates, NP  traZODone (DESYREL) 100 MG tablet Take 2 tablets (200 mg total) by mouth at bedtime. For sleep 03/12/14   Encarnacion Slates, NP   BP 140/79  Pulse 130  Temp(Src) 98.8 F (37.1 C) (Oral)  Resp 18  SpO2 95% Physical Exam  Nursing note and vitals reviewed. Constitutional: He is oriented to person, place, and time. He appears well-developed and well-nourished. No distress.  Does not smell of alcohol. Pleasant, cooperative  HENT:  Head: Normocephalic and atraumatic.  Nose: Nose normal.  Eyes: Conjunctivae are normal.  Neck: Normal range of motion. Neck supple. No JVD present. No tracheal deviation present.   Cardiovascular: Regular rhythm and normal heart sounds.   No murmur heard. tachy  Pulmonary/Chest: Effort normal and breath sounds normal. No respiratory distress. He has no rales. He exhibits no tenderness.  Abdominal: Soft. Bowel sounds are normal. He exhibits no distension and no mass. There is no tenderness.  Musculoskeletal: Normal range of motion. He exhibits no edema.  No lower extremity edema, calf tenderness, warmth, erythema or palpable cords  Neurological: He is alert and oriented to person, place, and  time.  Alert and oriented x3. CN 3-12 tested and without deficit. 5/5 muscle strength in all extremities with flexion and extension.  Normal bulk and tone.  No sensory deficit to light touch.  Tandem gait normal.  Symmetric and equal 2+ patellar and brachioradialis DTRs.  No pronator drift.  Toes flexor bilaterally. +bilateral intention tremors  Skin: Skin is warm and dry. No rash noted.  Psychiatric: He has a normal mood and affect.    ED Course  Procedures (including critical care time) Labs Review Labs Reviewed  BASIC METABOLIC PANEL - Abnormal; Notable for the following:    Sodium 134 (*)    Potassium 3.6 (*)    Chloride 92 (*)    BUN 4 (*)    Anion gap 21 (*)    All other components within normal limits  CBC - Abnormal; Notable for the following:    HCT 38.6 (*)    RDW 16.3 (*)    All other components within normal limits  ETHANOL - Abnormal; Notable for the following:    Alcohol, Ethyl (B) 161 (*)    All other components within normal limits  PRO B NATRIURETIC PEPTIDE  I-STAT TROPOININ, ED  Randolm Idol, ED    Imaging Review Dg Chest 2 View  04/14/2014   CLINICAL DATA:  Chest pain today.  ETOH.  EXAM: CHEST  2 VIEW  COMPARISON:  03/08/2014  FINDINGS: Heart size is normal. There are no focal consolidations or pleural effusions. No pulmonary edema. Surgical clips are noted in the region of the gastroesophageal junction. Visualized osseous structures have a  normal appearance.  IMPRESSION: No active cardiopulmonary disease.   Electronically Signed   By: Shon Hale M.D.   On: 04/14/2014 20:52     EKG Interpretation None      MDM   Final diagnoses:  Alcohol dependence with uncomplicated withdrawal  Chest pain, unspecified chest pain type  High anion gap metabolic acidosis    Pt presents for help with alcohol dependence.  No sz activity. Not in DTs. Mildly tremulous.    Notes chest pain after vomiting all day yesterday from drinking so much.  Appears comfortable. CXR without wide mediastinum and well appearing, doubt dissection.  No pneumomediastium/ free air, doubt esophageal rupture.  No DVT/ PE sign.  No consolidation. To indicate PNA/ aspiration.  EKG without ischemic changes. Delta trop negative.   WAGMA liekly from dehydration/ starvation ketoacidosis due not eating this week and only drinking beer. Pt denies other alcohol ingestions such as methanol/ rubbing alcohol/ moonshine. Hydrated with 2LNS  On CIWA protocol.  11:42 PM clinical sober. Denies chest pain. Consult TTS.   Tammy Sours, MD 04/14/14 [phone]

## 2014-04-15 ENCOUNTER — Observation Stay (HOSPITAL_COMMUNITY)
Admission: AD | Admit: 2014-04-15 | Discharge: 2014-04-16 | Disposition: A | Discharge status: Home / Self Care | Payer: Medicaid Other | Source: Intra-hospital | Attending: Psychiatry | Admitting: Psychiatry

## 2014-04-15 ENCOUNTER — Encounter (HOSPITAL_COMMUNITY): Payer: Self-pay | Admitting: *Deleted

## 2014-04-15 DIAGNOSIS — F10230 Alcohol dependence with withdrawal, uncomplicated: Secondary | ICD-10-CM

## 2014-04-15 DIAGNOSIS — Z8673 Personal history of transient ischemic attack (TIA), and cerebral infarction without residual deficits: Secondary | ICD-10-CM | POA: Diagnosis not present

## 2014-04-15 DIAGNOSIS — F1010 Alcohol abuse, uncomplicated: Secondary | ICD-10-CM | POA: Diagnosis present

## 2014-04-15 DIAGNOSIS — F329 Major depressive disorder, single episode, unspecified: Secondary | ICD-10-CM | POA: Insufficient documentation

## 2014-04-15 DIAGNOSIS — M1990 Unspecified osteoarthritis, unspecified site: Secondary | ICD-10-CM | POA: Diagnosis not present

## 2014-04-15 DIAGNOSIS — K7030 Alcoholic cirrhosis of liver without ascites: Secondary | ICD-10-CM | POA: Diagnosis not present

## 2014-04-15 DIAGNOSIS — K219 Gastro-esophageal reflux disease without esophagitis: Secondary | ICD-10-CM | POA: Diagnosis not present

## 2014-04-15 DIAGNOSIS — F3289 Other specified depressive episodes: Secondary | ICD-10-CM | POA: Insufficient documentation

## 2014-04-15 DIAGNOSIS — J449 Chronic obstructive pulmonary disease, unspecified: Secondary | ICD-10-CM | POA: Insufficient documentation

## 2014-04-15 DIAGNOSIS — J4489 Other specified chronic obstructive pulmonary disease: Secondary | ICD-10-CM | POA: Insufficient documentation

## 2014-04-15 DIAGNOSIS — I10 Essential (primary) hypertension: Secondary | ICD-10-CM | POA: Diagnosis not present

## 2014-04-15 DIAGNOSIS — F17200 Nicotine dependence, unspecified, uncomplicated: Secondary | ICD-10-CM | POA: Insufficient documentation

## 2014-04-15 DIAGNOSIS — F10988 Alcohol use, unspecified with other alcohol-induced disorder: Principal | ICD-10-CM | POA: Insufficient documentation

## 2014-04-15 MED ORDER — CHLORDIAZEPOXIDE HCL 25 MG PO CAPS: 25.0000 mg | ORAL_CAPSULE | Freq: Every day | ORAL | Status: DC

## 2014-04-15 MED ORDER — CHLORDIAZEPOXIDE HCL 25 MG PO CAPS
25.0000 mg | ORAL_CAPSULE | Freq: Once | ORAL | Status: AC
  Administered 2014-04-15: 25 mg via ORAL
  Filled 2014-04-15: qty 1

## 2014-04-15 MED ORDER — VITAMIN B-1 100 MG PO TABS
100.0000 mg | ORAL_TABLET | Freq: Every day | ORAL | Status: DC
  Administered 2014-04-16: 100 mg via ORAL
  Filled 2014-04-15 (×3): qty 1

## 2014-04-15 MED ORDER — ADULT MULTIVITAMIN W/MINERALS CH
1.0000 | ORAL_TABLET | Freq: Every day | ORAL | Status: DC
  Administered 2014-04-15 – 2014-04-16 (×2): 1 via ORAL
  Filled 2014-04-15 (×5): qty 1

## 2014-04-15 MED ORDER — CHLORDIAZEPOXIDE HCL 25 MG PO CAPS: 25.0000 mg | ORAL_CAPSULE | ORAL | Status: DC

## 2014-04-15 MED ORDER — CHLORDIAZEPOXIDE HCL 25 MG PO CAPS: 25.0000 mg | ORAL_CAPSULE | Freq: Three times a day (TID) | ORAL | Status: DC

## 2014-04-15 MED ORDER — ALBUTEROL SULFATE HFA 108 (90 BASE) MCG/ACT IN AERS
2.0000 | INHALATION_SPRAY | Freq: Four times a day (QID) | RESPIRATORY_TRACT | Status: DC | PRN
  Administered 2014-04-16: 2 via RESPIRATORY_TRACT
  Filled 2014-04-15: qty 6.7

## 2014-04-15 MED ORDER — TRAZODONE HCL 100 MG PO TABS
200.0000 mg | ORAL_TABLET | Freq: Every day | ORAL | Status: DC
  Administered 2014-04-15: 200 mg via ORAL
  Filled 2014-04-15 (×3): qty 2

## 2014-04-15 MED ORDER — HYDROXYZINE HCL 25 MG PO TABS
25.0000 mg | ORAL_TABLET | Freq: Four times a day (QID) | ORAL | Status: DC | PRN
  Administered 2014-04-15 – 2014-04-16 (×2): 25 mg via ORAL
  Filled 2014-04-15 (×2): qty 1

## 2014-04-15 MED ORDER — METOPROLOL TARTRATE 25 MG PO TABS
25.0000 mg | ORAL_TABLET | Freq: Two times a day (BID) | ORAL | Status: DC
  Administered 2014-04-15 – 2014-04-16 (×2): 25 mg via ORAL
  Filled 2014-04-15 (×6): qty 1

## 2014-04-15 MED ORDER — THIAMINE HCL 100 MG/ML IJ SOLN
100.0000 mg | Freq: Once | INTRAMUSCULAR | Status: AC
  Administered 2014-04-15: 100 mg via INTRAMUSCULAR
  Filled 2014-04-15: qty 2

## 2014-04-15 MED ORDER — NICOTINE 21 MG/24HR TD PT24
21.0000 mg | MEDICATED_PATCH | Freq: Every day | TRANSDERMAL | Status: DC
  Administered 2014-04-15 – 2014-04-16 (×2): 21 mg via TRANSDERMAL
  Filled 2014-04-15 (×4): qty 1

## 2014-04-15 MED ORDER — FLUOXETINE HCL 20 MG PO CAPS
20.0000 mg | ORAL_CAPSULE | Freq: Three times a day (TID) | ORAL | Status: DC
  Administered 2014-04-15 – 2014-04-16 (×3): 20 mg via ORAL
  Filled 2014-04-15 (×8): qty 1

## 2014-04-15 MED ORDER — ACETAMINOPHEN 325 MG PO TABS
650.0000 mg | ORAL_TABLET | Freq: Four times a day (QID) | ORAL | Status: DC | PRN
  Administered 2014-04-15 – 2014-04-16 (×3): 650 mg via ORAL
  Filled 2014-04-15 (×3): qty 2

## 2014-04-15 MED ORDER — ONDANSETRON 4 MG PO TBDP: 4.0000 mg | ORAL_TABLET | Freq: Four times a day (QID) | ORAL | Status: DC | PRN

## 2014-04-15 MED ORDER — ARIPIPRAZOLE 5 MG PO TABS
5.0000 mg | ORAL_TABLET | Freq: Every day | ORAL | Status: DC
  Administered 2014-04-15 – 2014-04-16 (×2): 5 mg via ORAL
  Filled 2014-04-15 (×5): qty 1

## 2014-04-15 MED ORDER — NAPROXEN 500 MG PO TABS
500.0000 mg | ORAL_TABLET | Freq: Two times a day (BID) | ORAL | Status: DC
  Administered 2014-04-15 – 2014-04-16 (×2): 500 mg via ORAL
  Filled 2014-04-15 (×6): qty 1

## 2014-04-15 MED ORDER — IPRATROPIUM-ALBUTEROL 20-100 MCG/ACT IN AERS
2.0000 | INHALATION_SPRAY | Freq: Two times a day (BID) | RESPIRATORY_TRACT | Status: DC
  Administered 2014-04-15 – 2014-04-16 (×2): 2 via RESPIRATORY_TRACT
  Filled 2014-04-15 (×2): qty 4

## 2014-04-15 MED ORDER — LOPERAMIDE HCL 2 MG PO CAPS: 2.0000 mg | ORAL_CAPSULE | ORAL | Status: DC | PRN

## 2014-04-15 MED ORDER — CHLORDIAZEPOXIDE HCL 25 MG PO CAPS
25.0000 mg | ORAL_CAPSULE | Freq: Four times a day (QID) | ORAL | Status: DC | PRN
  Administered 2014-04-15: 25 mg via ORAL
  Filled 2014-04-15: qty 1

## 2014-04-15 MED ORDER — NICOTINE 21 MG/24HR TD PT24
MEDICATED_PATCH | TRANSDERMAL | Status: AC
  Administered 2014-04-15: 11:00:00
  Filled 2014-04-15: qty 1

## 2014-04-15 MED ORDER — FLUOXETINE HCL 20 MG PO CAPS
20.0000 mg | ORAL_CAPSULE | Freq: Two times a day (BID) | ORAL | Status: DC
  Administered 2014-04-15: 20 mg via ORAL
  Filled 2014-04-15 (×5): qty 1

## 2014-04-15 MED ORDER — CHLORDIAZEPOXIDE HCL 25 MG PO CAPS
25.0000 mg | ORAL_CAPSULE | Freq: Four times a day (QID) | ORAL | Status: DC
  Administered 2014-04-15 – 2014-04-16 (×3): 25 mg via ORAL
  Filled 2014-04-15 (×3): qty 1

## 2014-04-15 NOTE — BH Assessment (Signed)
Tele Assessment Note   Samuel Reyes is a 58 y.o. male presenting to [facility] for alcohol detox. Pt denies SI/HI/AVH.  Pt says he drinks an 18pk of beer, daily.  His last drink was 04/14/14, he drank a 12 pk.  Pt denies seizures, endorses blackouts and says he has no current legal issues.  Pt is c/o w/d sxs: tremors, hot/cold sweats.    Axis I: Alcohol Abuse and Depressive Disorder NOS Axis II: Deferred Axis III:  Past Medical History  Diagnosis Date  . Cirrhosis   . Degenerative joint disease   . Hypertension   . Gastric ulcer   . Depression   . Asthma   . Mitral valve prolapse 2002  . H/O hiatal hernia   . COPD (chronic obstructive pulmonary disease)   . TIA (transient ischemic attack)     2010  . Stroke     TIA - 2010 - no deficits   . Shortness of breath     with exertion   . GERD (gastroesophageal reflux disease)   . Headache(784.0)   . Hiatal hernia 1982  . Degenerative joint disease    Axis IV: other psychosocial or environmental problems, problems related to social environment and problems with primary support group Axis V: 51-60 moderate symptoms  Past Medical History:  Past Medical History  Diagnosis Date  . Cirrhosis   . Degenerative joint disease   . Hypertension   . Gastric ulcer   . Depression   . Asthma   . Mitral valve prolapse 2002  . H/O hiatal hernia   . COPD (chronic obstructive pulmonary disease)   . TIA (transient ischemic attack)     2010  . Stroke     TIA - 2010 - no deficits   . Shortness of breath     with exertion   . GERD (gastroesophageal reflux disease)   . Headache(784.0)   . Hiatal hernia 1982  . Degenerative joint disease     Past Surgical History  Procedure Laterality Date  . Gastrectomy    . Shoulder surgery Bilateral     3 surgeries on on left, 2 surgeries on right   . Rt knee arthroscopic surgery    . Back surgery      3 cervical spine surgeries C4-C5 fused  . Hernia repair    . Finger surgery Left     2nd, 3rd, &  4th fingers were cut off by table saw and reattached  . Colonoscopy N/A 01/04/2014    Procedure: COLONOSCOPY;  Surgeon: Danie Binder, MD;  [facility];  Service: Endoscopy;  Laterality: N/A;  1:45  . Esophagogastroduodenoscopy N/A 01/04/2014    Procedure: ESOPHAGOGASTRODUODENOSCOPY (EGD);  Surgeon: Danie Binder, MD;  [facility];  Service: Endoscopy;  Laterality: N/A;  . Incisional hernia repair N/A 01/20/2014    Procedure: LAPAROSCOPIC RECURRENT  INCISIONAL HERNIA with mesh;  Surgeon: Edward Jolly, MD;  Location: [facility];  Service: General;  Laterality: N/A;    Family History:  Family History  Problem Relation Age of Onset  . Cancer Father     bone  . Cancer Brother     lungs  . Stroke Maternal Grandmother   . Colon cancer Neg Hx   . Asthma Son     died at age 55 in his sleep   . Spina bifida Son     died at age 87     Social History:  reports that he has been smoking  Cigarettes.  He started smoking about 47 years ago. He has a 22.5 pack-year smoking history. He has never used smokeless tobacco. He reports that he drinks about 3.6 ounces of alcohol per week. He reports that he does not use illicit drugs.  Additional Social History:  Alcohol / Drug Use Pain Medications: See MAR  Prescriptions: See MAR  Over the Counter: See MAR  History of alcohol / drug use?: Yes Longest period of sobriety (when/how long): When in detox  Negative Consequences of Use: Work / School;Personal relationships Withdrawal Symptoms: Fever / Chills;Tremors Substance #1 Name of Substance 1: Alcohol  1 - Age of First Use: 17 YOM  1 - Amount (size/oz): 18 Pk Beer 1 - Frequency: Daily  1 - Duration: On-going  1 - Last Use / Amount: 04/14/14  CIWA: CIWA-Ar BP: 143/73 mmHg Pulse Rate: 100 Nausea and Vomiting: no nausea and no vomiting Tactile Disturbances: none Tremor: no tremor Auditory Disturbances: not present Paroxysmal Sweats: barely perceptible sweating, palms  moist Visual Disturbances: not present Anxiety: no anxiety, at ease Headache, Fullness in Head: very mild Agitation: normal activity Orientation and Clouding of Sensorium: oriented and can do serial additions CIWA-Ar Total: 2 COWS:    PATIENT STRENGTHS: (choose at least two) Communication skills Motivation for treatment/growth  Allergies:  Allergies  Allergen Reactions  . Bee Venom Anaphylaxis  . Penicillins Rash    Home Medications:  (Not in a hospital admission)  OB/GYN Status:  No LMP for male patient.  General Assessment Data Location of Assessment: [facility] ED Is this a Tele or Face-to-Face Assessment?: Tele Assessment Is this an Initial Assessment or a Re-assessment for this encounter?: Initial Assessment Living Arrangements: Alone Can pt return to current living arrangement?: Yes Admission Status: Voluntary Is patient capable of signing voluntary admission?: Yes Transfer from: Home Referral Source: Self/Family/Friend  Medical Screening Exam ([place]) Medical Exam completed: No Reason for MSE not completed: Other: (None )  [facility] Crisis Care Plan Living Arrangements: Alone Name of Psychiatrist: [place] Name of Therapist: [facility]  Education Status Is patient currently in school?: No Current Grade: None  Highest grade of school patient has completed: None  Name of school: None  Contact person: None   Risk to self with the past 6 months Suicidal Ideation: No Suicidal Intent: No Is patient at risk for suicide?: No Suicidal Plan?: No Access to Means: No What has been your use of drugs/alcohol within the last 12 months?: Abusing: alcohol  Previous Attempts/Gestures: Yes How many times?: 1 Other Self Harm Risks: None  Triggers for Past Attempts: Other (Comment) (Son died 5 yrs ago ) Intentional Self Injurious Behavior: None Family Suicide History: No Recent stressful life event(s): Financial Problems;Loss (Comment) (3 family members have died  ) Persecutory voices/beliefs?: No Depression: Yes Depression Symptoms: Loss of interest in usual pleasures;Feeling worthless/self pity Substance abuse history and/or treatment for substance abuse?: Yes Suicide prevention information given to non-admitted patients: Not applicable  Risk to Others within the past 6 months Homicidal Ideation: No Thoughts of Harm to Others: No Current Homicidal Intent: No Current Homicidal Plan: No Access to Homicidal Means: No Identified Victim: None  History of harm to others?: No Assessment of Violence: None Noted Violent Behavior Description: None  Does patient have access to weapons?: No Criminal Charges Pending?: No Does patient have a court date: No  Psychosis Hallucinations: None noted Delusions: None noted  Mental Status Report Appear/Hygiene: In scrubs Eye Contact: Good Motor Activity: Unremarkable Speech: Logical/coherent Level of  Consciousness: Alert Mood: Depressed Affect: Depressed;Flat Anxiety Level: None Thought Processes: Coherent;Relevant Judgement: Unimpaired Orientation: Person;Place;Time;Situation Obsessive Compulsive Thoughts/Behaviors: None  Cognitive Functioning Concentration: Normal Memory: Recent Intact;Remote Intact IQ: Average Insight: Fair Impulse Control: Fair Appetite: Fair Weight Loss: 0 Weight Gain: 0 Sleep: Decreased Total Hours of Sleep: 5 Vegetative Symptoms: None  ADLScreening [facility] Assessment Services) Patient's cognitive ability adequate to safely complete daily activities?: Yes Patient able to express need for assistance with ADLs?: Yes Independently performs ADLs?: Yes (appropriate for developmental age)  Prior Inpatient Therapy Prior Inpatient Therapy: Yes Prior Therapy Dates: 2014,2015 and 6 yrs ago  Prior Therapy Facilty/Provider(s): [facility]; [facility]  Reason for Treatment: Detox/Depression   Prior Outpatient Therapy Prior Outpatient Therapy: Yes Prior Therapy Dates: One year to  current Prior Therapy Facilty/Provider(s): [facility] Reason for Treatment: Med management  ADL Screening (condition at time of admission) Patient's cognitive ability adequate to safely complete daily activities?: Yes Is the patient deaf or have difficulty hearing?: No Does the patient have difficulty seeing, even when wearing glasses/contacts?: No Does the patient have difficulty concentrating, remembering, or making decisions?: No Patient able to express need for assistance with ADLs?: Yes Does the patient have difficulty dressing or bathing?: No Independently performs ADLs?: Yes (appropriate for developmental age) Does the patient have difficulty walking or climbing stairs?: No Weakness of Legs: None Weakness of Arms/Hands: None  Home Assistive Devices/Equipment Home Assistive Devices/Equipment: None  Therapy Consults (therapy consults require a physician order) PT Evaluation Needed: No OT Evalulation Needed: No SLP Evaluation Needed: No Abuse/Neglect Assessment (Assessment to be complete while patient is alone) Physical Abuse: Denies Verbal Abuse: Denies Sexual Abuse: Denies Exploitation of patient/patient's resources: Denies Self-Neglect: Denies Values / Beliefs Cultural Requests During Hospitalization: None Spiritual Requests During Hospitalization: None Consults Spiritual Care Consult Needed: No Social Work Consult Needed: No Regulatory affairs officer (For Healthcare) Does patient have an advance directive?: No Would patient like information on creating an advanced directive?: No - patient declined information Does patient want to make changes to advanced directive?: No - Patient declined Nutrition Screen- [facility] Patient's home diet: Regular  Additional Information 1:1 In Past 12 Months?: No CIRT Risk: No Elopement Risk: No Does patient have medical clearance?: Yes     Disposition:  Disposition Initial Assessment Completed for this Encounter: Yes Disposition  of Patient: Inpatient treatment program [facility] accepted to OBS Unit #5) Type of inpatient treatment program: Adult Patient referred to: Other (Comment) (Accepted to OBS Unit #5)  G[place]a Rm 04/15/2014 8:09 AM

## 2014-04-15 NOTE — ED Notes (Signed)
TTS being done at this time.  

## 2014-04-15 NOTE — Progress Notes (Signed)
Patient ID: Samuel Reyes, male   DOB: 12-Nov-1955, 58 y.o.   MRN: [id number] D. Pt admitted to OBS unit. Notified Dr. Sabra Heck of patients arrival- here for detox. Orders received for detox protocol. R. Patient is safe, will continue to monitor q 15 minutes for safety.

## 2014-04-15 NOTE — Progress Notes (Signed)
Patient ID: Samuel Reyes, male   DOB: [birth year], 58 y.o.   MRN: [id number] Admission Note-Admitted to OBS from [facility] ED for detox. He has been drinking 5 cases of beer in the last one week. He has been drinking since he was 58 yo. Longest point of sobriety was 9 months. He has a history of blackouts, and seizures related to his alcohol use. He has had numerous losses in the last several years and this is the time of year many of his family have died, most recent loss was his wife two years ago. He reports sweating and freezing, and has hand tremors. His last drink was yesterday. He uses no other drugs. He has multiple health problems and had hernia surgery three weeks ago. He states he had a seizure yesterday from drinking and fell down the stairs at home and his chest area is hurting as a result of it. He also, has degenerative joint disease and has many aches and pains and has a current pain level of 8. He is not suicidal or homicidal and is not psychotic. He is cooperative with the admission process. He was given food and beverage on his admission on to the unit.

## 2014-04-15 NOTE — Progress Notes (Signed)
Pt alert, oriented and cooperative. Denies SI/HI, verbally contracts for safety. -A/[place].c/o increased anxiety and requesting inhalers. Pt report chest pain when inhaler not used due to anxiety and sob. Dr. Darleene Cleaver notified and orders given. See MAR. Will continue to monitor closely and evaluate for stabilization.

## 2014-04-15 NOTE — Progress Notes (Signed)
[place] INPATIENT:  Family/Significant Other Suicide Prevention Education  Suicide Prevention Education:  Patient Refusal for Family/Significant Other Suicide Prevention Education: The patient Samuel Reyes has refused to provide written consent for family/significant other to be provided Family/Significant Other Suicide Prevention Education during admission and/or prior to discharge.  Physician notified.  Davina Poke 04/15/2014, 11:32 AM

## 2014-04-15 NOTE — Plan of Care (Signed)
[place] Observation Crisis Plan  Reason for Crisis Plan:  Substance Abuse   Plan of Care:  Referral for Substance Abuse  Family Support:      Current Living Environment:     Insurance:   Hospital Account   Name Acct ID Class Status Primary Coverage   Rohail, Klees [id number] [place]        Guarantor Account (for Hospital Account [id number])   Name Relation to Pt Service Area Active? Acct Type   Scherrie Merritts Self [facility] Yes Behavioral Health   Address Phone       [address] [place], [place] [address] [phone])          Coverage Information (for Hospital Account [id number])   F/O Payor/Plan Precert #   [facility] MEDICAID/[place] MEDICA[place]    Subscriber Subscriber #   Abhinav, Mayorquin [id number] [facility]   Address Phone   [address] [place], [place] [address] [phone]      Legal Guardian:     Primary Care Provider:  Lorayne Marek, MD  Current Outpatient Providers:  none  Psychiatrist:     Counselor/Therapist:     Compliant with Medications:  No  Additional Information:   Davina Poke 9/17/201511:29 AM

## 2014-04-16 DIAGNOSIS — F10988 Alcohol use, unspecified with other alcohol-induced disorder: Secondary | ICD-10-CM | POA: Diagnosis not present

## 2014-04-16 DIAGNOSIS — F1020 Alcohol dependence, uncomplicated: Secondary | ICD-10-CM

## 2014-04-16 NOTE — [facility] Suicide Risk Assessment (Signed)
Suicide Risk Assessment  Discharge Assessment     Demographic Factors:  Male and Caucasian  Total Time spent with patient: 30 minutes  Psychiatric Specialty Exam:     Blood pressure 136/86, pulse 87, temperature 97.5 F (36.4 C), temperature source Oral, resp. rate 20, height 5\' 2"  (1.575 m), weight 68.04 kg (150 lb), SpO2 98.00%.Body mass index is 27.43 kg/(m^2).  General Appearance: Fairly Groomed  Engineer, water::  Fair  Speech:  Clear and Coherent  Volume:  Normal  Mood:  Anxious and worried  Affect:  Restricted  Thought Process:  Coherent and Goal Directed  Orientation:  Full (Time, Place, and Person)  Thought Content:  plans as he is D/C, relapse prevention plan  Suicidal Thoughts:  No  Homicidal Thoughts:  No  Memory:  Immediate;   Fair Recent;   Fair Remote;   Fair  Judgement:  Fair  Insight:  Present and Shallow  Psychomotor Activity:  Normal  Concentration:  Fair  Recall:  [employer] of Knowledge:NA  Language: Fair  Akathisia:  No  Handed:    AIMS (if indicated):     Assets:  Desire for Improvement  Sleep:       Musculoskeletal: Strength & Muscle Tone: within normal limits Gait & Station: normal Patient leans: N/A   Mental Status Per Nursing Assessment::   On Admission:  NA  Current Mental Status by Physician: In full contact with reality. There are no active S/S of withdrawal. No active SI plans or intent. He expressed his concerned about not having a place to go as his mother "kicked him out."    Loss Factors: Decline in physical health  Historical Factors: NA  Risk Reduction Factors:   will be placed in a half way house  Continued Clinical Symptoms:  Depression:   Comorbid alcohol abuse/dependence Alcohol/Substance Abuse/Dependencies  Cognitive Features That Contribute To Risk:  Thought constriction (tunnel vision)    Suicide Risk:  Minimal: No identifiable suicidal ideation.  Patients presenting with no risk factors but with morbid  ruminations; may be classified as minimal risk based on the severity of the depressive symptoms  Discharge Diagnoses:   AXIS I:  Alcohol Dependence, S/P alcohol withdrawal AXIS II:  No diagnosis AXIS III:   Past Medical History  Diagnosis Date  . Cirrhosis   . Degenerative joint disease   . Hypertension   . Gastric ulcer   . Depression   . Asthma   . Mitral valve prolapse 2002  . H/O hiatal hernia   . COPD (chronic obstructive pulmonary disease)   . TIA (transient ischemic attack)     2010  . Stroke     TIA - 2010 - no deficits   . Shortness of breath     with exertion   . GERD (gastroesophageal reflux disease)   . Headache(784.0)   . Hiatal hernia 1982  . Degenerative joint disease    AXIS IV:  other psychosocial or environmental problems AXIS V:  61-70 mild symptoms  Plan Of Care/Follow-up recommendations:  Activity:  as tolerated Diet:  regular Will be going to a half way house. (this will take care of his main concern being placement) Is patient on multiple antipsychotic therapies at discharge:  No   Has Patient had three or more failed trials of antipsychotic monotherapy by history:  No  Recommended Plan for Multiple Antipsychotic Therapies: NA    Johari Bennetts A 04/16/2014, 3:21 PM

## 2014-04-16 NOTE — Progress Notes (Signed)
Pt discharged and picked up by Clearnce Sorrel from [facility]. All belongings returned. AVS reviewed, no further questions.

## 2014-04-16 NOTE — Progress Notes (Signed)
Writer called and left message with [facility] [employer] requesting a call back to discuss discharge housing. Patient states that he will be homeless once discharged.

## 2014-04-16 NOTE — H&P (Signed)
[facility]: HPI/SRA                                   " I have reviewed the assessment from the ED and I agree. Patient details updated to reflect current status."  Samuel Reyes is a 58 y.o. male presenting to [facility] for alcohol detox. Pt denies SI/HI/AVH. Pt says he drinks an 18pk of beer, daily. His last drink was 04/14/14, he drank a 12 pk. Pt denies seizures, endorses blackouts and says he has no current legal issues. Pt is c/o w/d sxs: tremors, hot/cold sweats.  Axis I: Alcohol Abuse and Depressive Disorder NOS  Axis II: Deferred  Axis III:    Past Medical History    Diagnosis  Date    .  Cirrhosis     .  Degenerative joint disease     .  Hypertension     .  Gastric ulcer     .  Depression     .  Asthma     .  Mitral valve prolapse  2002    .  H/O hiatal hernia     .  COPD (chronic obstructive pulmonary disease)     .  TIA (transient ischemic attack)       2010    .  Stroke       TIA - 2010 - no deficits    .  Shortness of breath       with exertion    .  GERD (gastroesophageal reflux disease)     .  Headache(784.0)     .  Hiatal hernia  1982    .  Degenerative joint disease      Axis IV: other psychosocial or environmental problems, problems related to social environment and problems with primary support group  Axis V: 51-60 moderate symptoms  Past Medical History:    Past Medical History    Diagnosis  Date    .  Cirrhosis     .  Degenerative joint disease     .  Hypertension     .  Gastric ulcer     .  Depression     .  Asthma     .  Mitral valve prolapse  2002    .  H/O hiatal hernia     .  COPD (chronic obstructive pulmonary disease)     .  TIA (transient ischemic attack)       2010    .  Stroke       TIA - 2010 - no deficits    .  Shortness of breath       with exertion    .  GERD (gastroesophageal reflux disease)     .  Headache(784.0)     .  Hiatal hernia  1982    .  Degenerative joint disease       Past Surgical History    Procedure  Laterality  Date    .  Gastrectomy      .  Shoulder surgery  Bilateral       3 surgeries on on left, 2 surgeries on right    .  Rt  knee arthroscopic surgery      .  Back surgery        3 cervical spine surgeries C4-C5 fused    .  Hernia repair      .  Finger surgery  Left       2nd, 3rd, & 4th fingers were cut off by table saw and reattached    .  Colonoscopy  N/A  01/04/2014      Procedure: COLONOSCOPY; Surgeon: Danie Binder, MD; Location: [facility]; Service: Endoscopy; Laterality: N/A; 1:45    .  Esophagogastroduodenoscopy  N/A  01/04/2014      Procedure: ESOPHAGOGASTRODUODENOSCOPY (EGD); Surgeon: Danie Binder, MD; Location: [facility]; Service: Endoscopy; Laterality: N/A;    .  Incisional hernia repair  N/A  01/20/2014      Procedure: LAPAROSCOPIC RECURRENT INCISIONAL HERNIA with mesh; Surgeon: Edward Jolly, MD; Location: [facility]; Service: General; Laterality: N/A;     Family History:    Family History    Problem  Relation  Age of Onset    .  Cancer  Father       bone    .  Cancer  Brother       lungs    .  Stroke  Maternal Grandmother     .  Colon cancer  Neg Hx     .  Asthma  Son       died at age 22 in his sleep    .  Spina bifida  Son       died at age 107     Social History: reports that he has been smoking Cigarettes. He started smoking about 47 years ago. He has a 22.5 pack-year smoking history. He has never used smokeless tobacco. He reports that he drinks about 3.6 ounces of alcohol per week. He reports that he does not use illicit drugs.  Additional Social History: Alcohol / Drug Use  Pain Medications: See MAR  Prescriptions: See MAR  Over the Counter: See MAR  History of alcohol / drug use?: Yes  Longest period of sobriety (when/how long): When in detox  Negative Consequences of Use: Work / School;Personal relationships  Withdrawal Symptoms: Fever / Chills;Tremors  Substance #1  Name of Substance 1: Alcohol  1 -  Age of First Use: 17 YOM  1 - Amount (size/oz): 18 Pk Beer  1 - Frequency: Daily  1 - Duration: On-going  1 - Last Use / Amount: 04/14/14  CIWA: CIWA-Ar  BP: 143/73 mmHg  Pulse Rate: 100  Nausea and Vomiting: no nausea and no vomiting  Tactile Disturbances: none  Tremor: no tremor  Auditory Disturbances: not present  Paroxysmal Sweats: barely perceptible sweating, palms moist  Visual Disturbances: not present  Anxiety: no anxiety, at ease  Headache, Fullness in Head: very mild  Agitation: normal activity  Orientation and Clouding of Sensorium: oriented and can do serial additions  CIWA-Ar Total: 2  COWS:  PATIENT STRENGTHS: (choose at least two)  Communication skills  Motivation for treatment/growth  Allergies:    Allergies    Allergen  Reactions    .  Bee Venom  Anaphylaxis    .  Penicillins  Rash     Home Medications:  (Not in a hospital admission)  OB/GYN Status: No LMP for male patient.  General Assessment Data  [facility] ED  Is this a Tele or Face-to-Face Assessment?: Tele Assessment  Is this an Initial Assessment or a Re-assessment  for this encounter?: Initial Assessment  Living Arrangements: Alone  Can pt return to current living arrangement?: Yes  Admission Status: Voluntary  Is patient capable of signing voluntary admission?: Yes  Transfer from: Home  Referral Source: Self/Family/Friend  Medical Screening Exam ([place])  Medical Exam completed: No  Reason for MSE not completed: Other: (None )  [facility] Crisis Care Plan  Living Arrangements: Alone  Name of Psychiatrist: [place]  Name of Therapist: [facility]  Education Status  Is patient currently in school?: No  Current Grade: None  Highest grade of school patient has completed: None  Name of school: None  Contact person: None  Risk to self with the past 6 months  Suicidal Ideation: No  Suicidal Intent: No  Is patient at risk for suicide?: No  Suicidal Plan?: No  Access to Means:  No  What has been your use of drugs/alcohol within the last 12 months?: Abusing: alcohol  Previous Attempts/Gestures: Yes  How many times?: 1  Other Self Harm Risks: None  Triggers for Past Attempts: Other (Comment) (Son died 5 yrs ago )  Intentional Self Injurious Behavior: None  Family Suicide History: No  Recent stressful life event(s): Financial Problems;Loss (Comment) (3 family members have died )  Persecutory voices/beliefs?: No  Depression: Yes  Depression Symptoms: Loss of interest in usual pleasures;Feeling worthless/self pity  Substance abuse history and/or treatment for substance abuse?: Yes  Suicide prevention information given to non-admitted patients: Not applicable  Risk to Others within the past 6 months  Homicidal Ideation: No  Thoughts of Harm to Others: No  Current Homicidal Intent: No  Current Homicidal Plan: No  Access to Homicidal Means: No  Identified Victim: None  History of harm to others?: No  Assessment of Violence: None Noted  Violent Behavior Description: None  Does patient have access to weapons?: No  Criminal Charges Pending?: No  Does patient have a court date: No  Psychosis  Hallucinations: None noted  Delusions: None noted  Mental Status Report  Appear/Hygiene: In scrubs  Eye Contact: Good  Motor Activity: Unremarkable  Speech: Logical/coherent  Level of Consciousness: Alert  Mood: Depressed  Affect: Depressed;Flat  Anxiety Level: None  Thought Processes: Coherent;Relevant  Judgement: Unimpaired  Orientation: Person;Place;Time;Situation  Obsessive Compulsive Thoughts/Behaviors: None  Cognitive Functioning  Concentration: Normal  Memory: Recent Intact;Remote Intact  IQ: Average  Insight: Fair  Impulse Control: Fair  Appetite: Fair  Weight Loss: 0  Weight Gain: 0  Sleep: Decreased  Total Hours of Sleep: 5  Vegetative Symptoms: None  ADLScreening [facility])  Patient's cognitive ability adequate to safely complete  daily activities?: Yes  Patient able to express need for assistance with ADLs?: Yes  Independently performs ADLs?: Yes (appropriate for developmental age)  Prior Inpatient Therapy  Prior Inpatient Therapy: Yes  Prior Therapy Dates: 2014,2015 and 6 yrs ago  Prior Therapy Facilty/Provider(s): [facility]; [facility]  Reason for Treatment: Detox/Depression  Prior Outpatient Therapy  Prior Outpatient Therapy: Yes  Prior Therapy Dates: One year to current  Prior Therapy Facilty/Provider(s): [facility]  Reason for Treatment: Med management  ADL Screening (condition at time of admission)  Patient's cognitive ability adequate to safely complete daily activities?: Yes  Is the patient deaf or have difficulty hearing?: No  Does the patient have difficulty seeing, even when wearing glasses/contacts?: No  Does the patient have difficulty concentrating, remembering, or making decisions?: No  Patient able to express need for assistance with ADLs?: Yes  Does the patient have difficulty  dressing or bathing?: No  Independently performs ADLs?: Yes (appropriate for developmental age)  Does the patient have difficulty walking or climbing stairs?: No  Weakness of Legs: None  Weakness of Arms/Hands: None  Home Assistive Devices/Equipment  Home Assistive Devices/Equipment: None  Therapy Consults (therapy consults require a physician order)  PT Evaluation Needed: No  OT Evalulation Needed: No  SLP Evaluation Needed: No  Abuse/Neglect Assessment (Assessment to be complete while patient is alone)  Physical Abuse: Denies  Verbal Abuse: Denies  Sexual Abuse: Denies  Exploitation of patient/patient's resources: Denies  Self-Neglect: Denies  Values / Beliefs  Cultural Requests During Hospitalization: None  Suicide Risk Assessment  Admission Assessment     Nursing information obtained from:  Patient Demographic factors:  Male;Divorced or widowed;Caucasian;Low socioeconomic status Current Mental Status:  NA Loss  Factors:  Decline in physical health Historical Factors:  Prior suicide attempts;Anniversary of important loss Risk Reduction Factors:  Sense of responsibility to family;Living with another person, especially a relative Total Time spent with patient: 30 minutes  CLINICAL FACTORS:   Depression:   Comorbid alcohol abuse/dependence Alcohol/Substance Abuse/Dependencies Medical Diagnoses and Treatments/Surgeries  Psychiatric Specialty Exam:     Blood pressure 155/92, pulse 97, temperature 97.5 F (36.4 C), temperature source Oral, resp. rate 20, height 5\' 2"  (1.575 m), weight 68.04 kg (150 lb), SpO2 98.00%.Body mass index is 27.43 kg/(m^2).  General Appearance: Disheveled  Eye Sport and exercise psychologist::  Fair  Speech:  Clear and Coherent  Volume:  Normal  Mood:  Anxious and Depressed  Affect:  anxious, worried  Thought Process:  Coherent and Goal Directed  Orientation:  Other:  person place  Thought Content:  symptoms events worries and concerns  Suicidal Thoughts:  No  Homicidal Thoughts:  No  Memory:  Immediate;   Fair Recent;   Fair Remote;   Fair  Judgement:  Fair  Insight:  Present and Shallow  Psychomotor Activity:  Restlessness  Concentration:  Fair  Recall:  [employer] of Knowledge:NA  Language: Fair  Akathisia:  No  Handed:    AIMS (if indicated):     Assets:  Desire for Improvement  Sleep:      Musculoskeletal: Strength & Muscle Tone: within normal limits Gait & Station: normal Patient leans: N/A  COGNITIVE FEATURES THAT CONTRIBUTE TO RISK:  Closed-mindedness Polarized thinking Thought constriction (tunnel vision)    SUICIDE RISK:   Minimal: No identifiable suicidal ideation.  Patients presenting with no risk factors but with morbid ruminations; may be classified as minimal risk based on the severity of the depressive symptoms  PLAN OF CARE:  I     Disposition:  Admit to [facility] for monitoring and stabilization. Will address detox needs, reassess and address the co  morbidities, assess need for inpatient hospitalization and or referral to outpatient services/residential treatment program  Geralyn Flash A. Sabra Heck, M.D.

## 2014-04-16 NOTE — Progress Notes (Signed)
Tree Grey from [place] will be at [facility] to interview patient. If interview goes well, patient will leave with Mr. Veleta Miners for transitional Housing. Patient spoke with Mr. Veleta Miners and completed screening.

## 2014-04-16 NOTE — Progress Notes (Signed)
Patient will follow up with [facility] for med management.

## 2014-04-16 NOTE — Progress Notes (Deleted)
Writer and patient discussed strategies for maintaining his routine [facility] appointment. Patient states that he has missed his last several appointments and is out of medications. Patient was able to identify two family members that can remind him to make his appointments.

## 2014-04-16 NOTE — Progress Notes (Signed)
Patient seen by LCSW this morning with regards to resources in community and additional needs. Patient wants to go home, is followed by [facility] and LCSW contacted TCT representative to see if patient qualifies for TCT.  Plan for patient is DC today once MD has reviewed patient and cleared. Will most likely need a bus pas for transport home.  [facility] is where he will follow up.  Lane Hacker, MSW Clinical Social Work:  TTS Dispositions [phone]

## 2014-04-16 NOTE — Progress Notes (Deleted)
Patient reports no detox symptoms. Patient is concerned that his PTA meds have not been started. Patient requesting to go home stating "I am fine."

## 2014-04-17 NOTE — ED Provider Notes (Signed)
I saw and evaluated the patient, reviewed the resident's note and I agree with the findings and plan.   .Face to face Exam:  General:  Awake HEENT:  Atraumatic Resp:  Normal effort Abd:  Nondistended Neuro:No focal weakness  Dot Lanes, MD 04/17/14 [phone]

## 2014-04-22 NOTE — Discharge Summary (Signed)
Physician Discharge Summary Note  Patient:  Samuel Reyes is an 58 y.o., male MRN:  [id number] DOB:  04/16/1956 Patient phone:  [phone] (home)  Patient address:   [address] [place] [address],  Total Time spent with patient: 30 minutes  Date of Admission:  04/15/2014 Date of Discharge: 04/16/2014  Reason for Admission:  Samuel Reyes is a 58 y.o. male presenting to [facility] for alcohol detox. Pt denies SI/HI/AVH. Pt says he drinks an 18pk of beer, daily. His last drink was 04/14/14, he drank a 12 pk. Pt denies seizures, endorses blackouts and says he has no current legal issues. Pt is c/o w/d sxs: tremors, hot/cold sweats.  Axis I: Alcohol Abuse and Depressive Disorder NOS  Axis II: Deferred  Axis III:  Past Medical History  Diagnosis  Date  .  Cirrhosis  .  Degenerative joint disease  .  Hypertension  .  Gastric ulcer  .  Depression  .  Asthma  .  Mitral valve prolapse  2002  .  H/O hiatal hernia  .  COPD (chronic obstructive pulmonary disease)  .  TIA (transient ischemic attack)  2010  .  Stroke  TIA - 2010 - no deficits  .  Shortness of breath  with exertion  .  GERD (gastroesophageal reflux disease)  .  Headache(784.0)  .  Hiatal hernia  1982  .  Degenerative joint disease  Axis IV: other psychosocial or environmental problems, problems related to social environment and problems with primary support group  Axis V: 51-60 moderate symptoms  Past Medical History:  Past Medical History  Diagnosis  Date   .      Discharge Diagnoses: Active Problems:   S/P alcohol detoxification   Psychiatric Specialty Exam: Physical Exam  ROS  Blood pressure 136/86, pulse 87, temperature 97.5 F (36.4 C), temperature source Oral, resp. rate 20, height 5\' 2"  (1.575 m), weight 68.04 kg (150 lb), SpO2 98.00%.Body mass index is 27.43 kg/(m^2).  General Appearance: Disheveled  Eye Sport and exercise psychologist::  Fair  Speech:  Clear and Coherent  Volume:  Decreased   Mood:  Anxious and worried  Affect:  worried  Thought Process:  Coherent and Goal Directed  Orientation:  Full (Time, Place, and Person)  Thought Content:  plans as he moves on, relapse prevention plan  Suicidal Thoughts:  No  Homicidal Thoughts:  No  Memory:  Immediate;   Fair Recent;   Fair Remote;   Fair  Judgement:  Fair  Insight:  Present  Psychomotor Activity:  Normal  Concentration:  Fair  Recall:  [employer] of Knowledge:Fair  Language: Fair  Akathisia:  No  Handed:    AIMS (if indicated):     Assets:  Desire for Improvement  Sleep:       Past Psychiatric History: Diagnosis:  Hospitalizations:  Outpatient Care:  Substance Abuse Care:  Self-Mutilation:  Suicidal Attempts:  Violent Behaviors:   Musculoskeletal: Strength & Muscle Tone: within normal limits Gait & Station: normal Patient leans: N/A  DSM5:  Substance/Addictive Disorders:  Alcohol Related Disorder - Severe (303.90) Depressive Disorders:  Major Depressive Disorder - Moderate (296.22)  Axis Diagnosis:   AXIS I:  Substance Induced Mood Disorder AXIS II:  No diagnosis AXIS III:   Past Medical History  Diagnosis Date  . Cirrhosis   . Degenerative joint disease   . Hypertension   . Gastric ulcer   . Depression   . Asthma   . Mitral valve prolapse 2002  . H/O  hiatal hernia   . COPD (chronic obstructive pulmonary disease)   . TIA (transient ischemic attack)     2010  . Stroke     TIA - 2010 - no deficits   . Shortness of breath     with exertion   . GERD (gastroesophageal reflux disease)   . Headache(784.0)   . Hiatal hernia 1982  . Degenerative joint disease    AXIS IV:  other psychosocial or environmental problems AXIS V:  61-70 mild symptoms  Level of Care:  OP  Hospital Course:  He was admitted to the observation unit. Detox was pursued. He identified as a source of stress that his mother had kicked him out because of his drinking. We identified a half way house he could go  to. arrangements were made for the administrator of the house to come by. Upon discharge he was in full contact with reality, there were no active S/S of withdrawal. He had secured placement and was willing to continue outpatient follow up  Consults:  None  Significant Diagnostic Studies:  As per the ED  Discharge Vitals:   Blood pressure 136/86, pulse 87, temperature 97.5 F (36.4 C), temperature source Oral, resp. rate 20, height 5\' 2"  (1.575 m), weight 68.04 kg (150 lb), SpO2 98.00%. Body mass index is 27.43 kg/(m^2). Lab Results:   No results found for this or any previous visit (from the past 72 hour(s)).  Physical Findings: AIMS: Facial and Oral Movements Muscles of Facial Expression: None, normal Lips and Perioral Area: None, normal Jaw: None, normal Tongue: None, normal,Extremity Movements Upper (arms, wrists, hands, fingers): None, normal Lower (legs, knees, ankles, toes): None, normal, Trunk Movements Neck, shoulders, hips: None, normal, Overall Severity Severity of abnormal movements (highest score from questions above): None, normal Incapacitation due to abnormal movements: None, normal Patient's awareness of abnormal movements (rate only patient's report): No Awareness, Dental Status Current problems with teeth and/or dentures?: Yes Does patient usually wear dentures?: Yes  CIWA:  CIWA-Ar Total: 0 COWS:     Psychiatric Specialty Exam: See Psychiatric Specialty Exam and Suicide Risk Assessment completed by Attending Physician prior to discharge.  Discharge destination:  Other:  half way house  Is patient on multiple antipsychotic therapies at discharge:  No   Has Patient had three or more failed trials of antipsychotic monotherapy by history:  No  Recommended Plan for Multiple Antipsychotic Therapies: NA     Medication List       Indication   albuterol 108 (90 BASE) MCG/ACT inhaler  Commonly known as:  PROVENTIL HFA;VENTOLIN HFA  Inhale 2 puffs into the  lungs every 6 (six) hours as needed for wheezing or shortness of breath.   Indication:  Asthma     albuterol-ipratropium 18-103 MCG/ACT inhaler  Commonly known as:  COMBIVENT  Inhale 2 puffs into the lungs 2 (two) times daily. For shortness of breath   Indication:  Disease involving Spasms of the Lungs     ARIPiprazole 5 MG tablet  Commonly known as:  ABILIFY  Take 1 tablet (5 mg total) by mouth daily. For mood control   Indication:  Mood control     FLUoxetine 20 MG capsule  Commonly known as:  PROZAC  Take 1 capsule (20 mg total) by mouth 3 (three) times daily. For depression   Indication:  Depression     gabapentin 300 MG capsule  Commonly known as:  NEURONTIN  Take 1 capsule (300 mg total) by mouth 2 (two) times daily.  For substance withdrawal syndrome   Indication:  Substance withdrawal syndrome     metoprolol tartrate 25 MG tablet  Commonly known as:  LOPRESSOR  Take 1 tablet (25 mg total) by mouth 2 (two) times daily. For high blood pressure   Indication:  High Blood Pressure     naproxen 500 MG tablet  Commonly known as:  NAPROSYN  Take 1 tablet (500 mg total) by mouth 2 (two) times daily with a meal. For knee pain   Indication:  Mild to Moderate Pain     traZODone 100 MG tablet  Commonly known as:  DESYREL  Take 2 tablets (200 mg total) by mouth at bedtime. For sleep   Indication:  Trouble Sleeping           Follow-up Information   Follow up with [facility]. (Walk-in clinic begins at 8 AM  M-F. Arrive at 7:45 AM.)    Specialty:  [facility] information:   [place] [address] [phone]       Follow-up recommendations:  Activity:  as tolerated Diet:  regular Continue outpatient treatment/AA  Total Discharge Time:  Less than 30 minutes.  Signed: Evelyne Makepeace A 04/22/2014, 3:20 PM

## 2014-05-17 ENCOUNTER — Encounter: Payer: Self-pay | Admitting: Gastroenterology

## 2014-07-20 ENCOUNTER — Emergency Department (HOSPITAL_COMMUNITY): Payer: Medicaid Other

## 2014-07-20 ENCOUNTER — Encounter (HOSPITAL_COMMUNITY): Payer: Self-pay | Admitting: Emergency Medicine

## 2014-07-20 ENCOUNTER — Inpatient Hospital Stay (HOSPITAL_COMMUNITY): Admission: AD | Admit: 2014-07-20 | Payer: Medicaid Other | Source: Intra-hospital | Admitting: Psychiatry

## 2014-07-20 ENCOUNTER — Inpatient Hospital Stay (HOSPITAL_COMMUNITY)
Admission: EM | Admit: 2014-07-20 | Discharge: 2014-07-27 | DRG: 918 | Disposition: A | Discharge status: Home / Self Care | Payer: Medicaid Other | Attending: Internal Medicine | Admitting: Internal Medicine

## 2014-07-20 DIAGNOSIS — E222 Syndrome of inappropriate secretion of antidiuretic hormone: Secondary | ICD-10-CM | POA: Diagnosis present

## 2014-07-20 DIAGNOSIS — J45909 Unspecified asthma, uncomplicated: Secondary | ICD-10-CM | POA: Diagnosis present

## 2014-07-20 DIAGNOSIS — R45851 Suicidal ideations: Secondary | ICD-10-CM | POA: Diagnosis present

## 2014-07-20 DIAGNOSIS — Z880 Allergy status to penicillin: Secondary | ICD-10-CM

## 2014-07-20 DIAGNOSIS — F332 Major depressive disorder, recurrent severe without psychotic features: Secondary | ICD-10-CM | POA: Diagnosis present

## 2014-07-20 DIAGNOSIS — F17210 Nicotine dependence, cigarettes, uncomplicated: Secondary | ICD-10-CM | POA: Diagnosis present

## 2014-07-20 DIAGNOSIS — Y906 Blood alcohol level of 120-199 mg/100 ml: Secondary | ICD-10-CM | POA: Diagnosis present

## 2014-07-20 DIAGNOSIS — J440 Chronic obstructive pulmonary disease with acute lower respiratory infection: Secondary | ICD-10-CM | POA: Diagnosis present

## 2014-07-20 DIAGNOSIS — Z609 Problem related to social environment, unspecified: Secondary | ICD-10-CM | POA: Diagnosis present

## 2014-07-20 DIAGNOSIS — F419 Anxiety disorder, unspecified: Secondary | ICD-10-CM | POA: Diagnosis present

## 2014-07-20 DIAGNOSIS — J449 Chronic obstructive pulmonary disease, unspecified: Secondary | ICD-10-CM | POA: Diagnosis present

## 2014-07-20 DIAGNOSIS — Z79899 Other long term (current) drug therapy: Secondary | ICD-10-CM

## 2014-07-20 DIAGNOSIS — Z8673 Personal history of transient ischemic attack (TIA), and cerebral infarction without residual deficits: Secondary | ICD-10-CM | POA: Diagnosis not present

## 2014-07-20 DIAGNOSIS — K219 Gastro-esophageal reflux disease without esophagitis: Secondary | ICD-10-CM | POA: Diagnosis present

## 2014-07-20 DIAGNOSIS — T43592A Poisoning by other antipsychotics and neuroleptics, intentional self-harm, initial encounter: Secondary | ICD-10-CM | POA: Diagnosis present

## 2014-07-20 DIAGNOSIS — Z91030 Bee allergy status: Secondary | ICD-10-CM | POA: Diagnosis not present

## 2014-07-20 DIAGNOSIS — F22 Delusional disorders: Secondary | ICD-10-CM | POA: Diagnosis present

## 2014-07-20 DIAGNOSIS — J441 Chronic obstructive pulmonary disease with (acute) exacerbation: Secondary | ICD-10-CM | POA: Diagnosis present

## 2014-07-20 DIAGNOSIS — R509 Fever, unspecified: Secondary | ICD-10-CM | POA: Diagnosis not present

## 2014-07-20 DIAGNOSIS — D638 Anemia in other chronic diseases classified elsewhere: Secondary | ICD-10-CM | POA: Diagnosis present

## 2014-07-20 DIAGNOSIS — K7030 Alcoholic cirrhosis of liver without ascites: Secondary | ICD-10-CM | POA: Diagnosis present

## 2014-07-20 DIAGNOSIS — Z560 Unemployment, unspecified: Secondary | ICD-10-CM | POA: Diagnosis present

## 2014-07-20 DIAGNOSIS — K7460 Unspecified cirrhosis of liver: Secondary | ICD-10-CM | POA: Diagnosis present

## 2014-07-20 DIAGNOSIS — F329 Major depressive disorder, single episode, unspecified: Secondary | ICD-10-CM | POA: Diagnosis present

## 2014-07-20 DIAGNOSIS — T43212A Poisoning by selective serotonin and norepinephrine reuptake inhibitors, intentional self-harm, initial encounter: Secondary | ICD-10-CM | POA: Diagnosis present

## 2014-07-20 DIAGNOSIS — K30 Functional dyspepsia: Secondary | ICD-10-CM | POA: Diagnosis present

## 2014-07-20 DIAGNOSIS — J219 Acute bronchiolitis, unspecified: Secondary | ICD-10-CM | POA: Diagnosis present

## 2014-07-20 DIAGNOSIS — T50902A Poisoning by unspecified drugs, medicaments and biological substances, intentional self-harm, initial encounter: Secondary | ICD-10-CM

## 2014-07-20 DIAGNOSIS — F10230 Alcohol dependence with withdrawal, uncomplicated: Secondary | ICD-10-CM | POA: Diagnosis present

## 2014-07-20 DIAGNOSIS — E871 Hypo-osmolality and hyponatremia: Secondary | ICD-10-CM | POA: Diagnosis present

## 2014-07-20 DIAGNOSIS — I10 Essential (primary) hypertension: Secondary | ICD-10-CM | POA: Diagnosis present

## 2014-07-20 LAB — CBC
HEMATOCRIT: 31.4 % — AB (ref 39.0–52.0)
Hemoglobin: 10.6 g/dL — ABNORMAL LOW (ref 13.0–17.0)
MCH: 29.4 pg (ref 26.0–34.0)
MCHC: 33.8 g/dL (ref 30.0–36.0)
MCV: 87.2 fL (ref 78.0–100.0)
Platelets: 482 10*3/uL — ABNORMAL HIGH (ref 150–400)
RBC: 3.6 MIL/uL — ABNORMAL LOW (ref 4.22–5.81)
RDW: 14.6 % (ref 11.5–15.5)
WBC: 13.5 10*3/uL — AB (ref 4.0–10.5)

## 2014-07-20 LAB — HEPATIC FUNCTION PANEL
ALBUMIN: 2.7 g/dL — AB (ref 3.5–5.2)
ALT: 13 U/L (ref 0–53)
AST: 23 U/L (ref 0–37)
Alkaline Phosphatase: 97 U/L (ref 39–117)
Bilirubin, Direct: <0.1 mg/dL (ref 0.0–0.3)
TOTAL PROTEIN: 6.1 g/dL (ref 6.0–8.3)
Total Bilirubin: 0.3 mg/dL (ref 0.3–1.2)

## 2014-07-20 LAB — URINALYSIS, ROUTINE W REFLEX MICROSCOPIC
BILIRUBIN URINE: NEGATIVE
Glucose, UA: NEGATIVE mg/dL
HGB URINE DIPSTICK: NEGATIVE
Ketones, ur: 15 mg/dL — AB
LEUKOCYTES UA: NEGATIVE
Nitrite: NEGATIVE
PH: 6.5 (ref 5.0–8.0)
PROTEIN: NEGATIVE mg/dL
Specific Gravity, Urine: 1.004 — ABNORMAL LOW (ref 1.005–1.030)
Urobilinogen, UA: 0.2 mg/dL (ref 0.0–1.0)

## 2014-07-20 LAB — BASIC METABOLIC PANEL
Anion gap: 12 (ref 5–15)
CHLORIDE: 91 meq/L — AB (ref 96–112)
CO2: 25 mmol/L (ref 19–32)
Calcium: 8.1 mg/dL — ABNORMAL LOW (ref 8.4–10.5)
Creatinine, Ser: 0.62 mg/dL (ref 0.50–1.35)
GFR calc non Af Amer: >90 mL/min (ref >90)
GLUCOSE: 86 mg/dL (ref 70–99)
POTASSIUM: 4.3 mmol/L (ref 3.5–5.1)
Sodium: 128 mmol/L — ABNORMAL LOW (ref 135–145)

## 2014-07-20 LAB — TROPONIN I
Troponin I: <0.03 ng/mL (ref <0.031)
Troponin I: <0.03 ng/mL (ref <0.031)

## 2014-07-20 LAB — RAPID URINE DRUG SCREEN, HOSP PERFORMED
Amphetamines: NOT DETECTED
Barbiturates: NOT DETECTED
Benzodiazepines: NOT DETECTED
COCAINE: NOT DETECTED
OPIATES: NOT DETECTED
Tetrahydrocannabinol: NOT DETECTED

## 2014-07-20 LAB — ACETAMINOPHEN LEVEL

## 2014-07-20 LAB — ETHANOL: Alcohol, Ethyl (B): 151 mg/dL — ABNORMAL HIGH (ref 0–9)

## 2014-07-20 LAB — SALICYLATE LEVEL

## 2014-07-20 MED ORDER — LORAZEPAM 2 MG/ML IJ SOLN: 0.0000 mg | Freq: Two times a day (BID) | INTRAMUSCULAR | Status: DC

## 2014-07-20 MED ORDER — ALBUTEROL SULFATE HFA 108 (90 BASE) MCG/ACT IN AERS: 2.0000 | INHALATION_SPRAY | Freq: Four times a day (QID) | RESPIRATORY_TRACT | Status: DC | PRN

## 2014-07-20 MED ORDER — ONDANSETRON HCL 4 MG PO TABS
4.0000 mg | ORAL_TABLET | Freq: Three times a day (TID) | ORAL | Status: DC | PRN
  Administered 2014-07-20: 4 mg via ORAL
  Filled 2014-07-20: qty 1

## 2014-07-20 MED ORDER — ACETAMINOPHEN 325 MG PO TABS
650.0000 mg | ORAL_TABLET | ORAL | Status: DC | PRN
Start: 2014-07-20 — End: 2014-07-21

## 2014-07-20 MED ORDER — VITAMIN B-1 100 MG PO TABS
100.0000 mg | ORAL_TABLET | Freq: Every day | ORAL | Status: DC
  Administered 2014-07-21: 100 mg via ORAL
  Filled 2014-07-20: qty 1

## 2014-07-20 MED ORDER — LORAZEPAM 1 MG PO TABS: 0.0000 mg | ORAL_TABLET | Freq: Two times a day (BID) | ORAL | Status: DC

## 2014-07-20 MED ORDER — IBUPROFEN 800 MG PO TABS
800.0000 mg | ORAL_TABLET | Freq: Once | ORAL | Status: AC
  Administered 2014-07-21: 800 mg via ORAL
  Filled 2014-07-20: qty 1

## 2014-07-20 MED ORDER — FLUOXETINE HCL 20 MG PO CAPS
20.0000 mg | ORAL_CAPSULE | Freq: Three times a day (TID) | ORAL | Status: DC
  Administered 2014-07-21: 20 mg via ORAL
  Filled 2014-07-20: qty 1

## 2014-07-20 MED ORDER — GABAPENTIN 300 MG PO CAPS
300.0000 mg | ORAL_CAPSULE | Freq: Two times a day (BID) | ORAL | Status: DC
  Administered 2014-07-21: 300 mg via ORAL
  Filled 2014-07-20: qty 1

## 2014-07-20 MED ORDER — METOPROLOL TARTRATE 25 MG PO TABS
25.0000 mg | ORAL_TABLET | Freq: Two times a day (BID) | ORAL | Status: DC
  Administered 2014-07-21: 25 mg via ORAL
  Filled 2014-07-20: qty 1

## 2014-07-20 MED ORDER — ALUM & MAG HYDROXIDE-SIMETH 200-200-20 MG/5ML PO SUSP: 30.0000 mL | ORAL | Status: DC | PRN

## 2014-07-20 MED ORDER — IBUPROFEN 400 MG PO TABS: 600.0000 mg | ORAL_TABLET | Freq: Three times a day (TID) | ORAL | Status: DC | PRN

## 2014-07-20 MED ORDER — ARIPIPRAZOLE 10 MG PO TABS: 5.0000 mg | ORAL_TABLET | Freq: Every day | ORAL | Status: DC

## 2014-07-20 MED ORDER — SODIUM CHLORIDE 0.9 % IV BOLUS (SEPSIS)
1000.0000 mL | Freq: Once | INTRAVENOUS | Status: AC
  Administered 2014-07-21: 1000 mL via INTRAVENOUS

## 2014-07-20 MED ORDER — THIAMINE HCL 100 MG/ML IJ SOLN: 100.0000 mg | Freq: Every day | INTRAMUSCULAR | Status: DC

## 2014-07-20 MED ORDER — ZOLPIDEM TARTRATE 5 MG PO TABS: 5.0000 mg | ORAL_TABLET | Freq: Every evening | ORAL | Status: DC | PRN

## 2014-07-20 MED ORDER — NICOTINE 21 MG/24HR TD PT24
21.0000 mg | MEDICATED_PATCH | Freq: Every day | TRANSDERMAL | Status: DC
  Administered 2014-07-20: 21 mg via TRANSDERMAL
  Filled 2014-07-20: qty 1

## 2014-07-20 MED ORDER — LORAZEPAM 1 MG PO TABS
1.0000 mg | ORAL_TABLET | Freq: Three times a day (TID) | ORAL | Status: DC | PRN
  Administered 2014-07-20: 1 mg via ORAL
  Filled 2014-07-20: qty 1

## 2014-07-20 MED ORDER — LORAZEPAM 2 MG/ML IJ SOLN
0.0000 mg | Freq: Four times a day (QID) | INTRAMUSCULAR | Status: DC
Start: 2014-07-21 — End: 2014-07-21
  Administered 2014-07-21: 2 mg via INTRAVENOUS
  Filled 2014-07-20: qty 1

## 2014-07-20 MED ORDER — ACETAMINOPHEN 500 MG PO TABS
1000.0000 mg | ORAL_TABLET | Freq: Once | ORAL | Status: DC
  Administered 2014-07-21: 1000 mg via ORAL
  Filled 2014-07-20: qty 2

## 2014-07-20 MED ORDER — LORAZEPAM 1 MG PO TABS: 0.0000 mg | ORAL_TABLET | Freq: Four times a day (QID) | ORAL | Status: DC

## 2014-07-20 NOTE — BH Assessment (Addendum)
Tele Assessment Note   Samuel Reyes is an 58 y.o. male. Pt presents at [facility] reporting that he attempted to harm himself. The Pt states that took a handful of Trazodone and NyQuil. Pt reports no HI. Pt states no delusions or hallucinations. Pt states that he is also in need of detox from alcohol. Pt reports that he drinks "a pack of beer" a day. Pt states he drank a pack of beer today and had shots of Vodka. Pt has been to detox 2x. Pt cannot remember where he went for detox. Pt reports he has been drinking since a teenager. Pt was once sober for 3 months. According to the Pt, he desires to harm himself because "no one cares." Pt reports that he was evicted from his mother's home because of his drinking. Pt states he is currently homeless. It was reported that the Pt has suffered from depression all of his life but his symptoms were exacerbated after the death of his two sons and his wife. Pt states he has a daughter but has no contact with her. Pt reports the following depressive symptoms: isolating himself, crying spells, depressed mood most of the day, and fatigue. Pt has been prescribed Trazodone, Prozac, and Abilify. Pt receives therapy and medication management from [facility]. Pt reports that he has been receiving services from [facility] for 7 months. Pt states he is on disability because no one will hire him.   Writer consulted with NP-Jamison. Per Theodoro Clock Pt meets inpatient criteria. Pt accepted to [facility]. Pt's bed 300-1 Writer informed Pt's RN-Erika and attempted to inform EDP-Walden.   Axis I: Major Depression, Recurrent severe Axis II: Deferred Axis III:  Past Medical History  Diagnosis Date  . Cirrhosis   . Degenerative joint disease   . Hypertension   . Gastric ulcer   . Depression   . Asthma   . Mitral valve prolapse 2002  . H/O hiatal hernia   . COPD (chronic obstructive pulmonary disease)   . TIA (transient ischemic attack)     2010  . Stroke     TIA - 2010 - no deficits   .  Shortness of breath     with exertion   . GERD (gastroesophageal reflux disease)   . Headache(784.0)   . Hiatal hernia 1982  . Degenerative joint disease    Axis IV: economic problems, housing problems, problems related to social environment and problems with primary support group Axis V: 31-40 impairment in reality testing  Past Medical History:  Past Medical History  Diagnosis Date  . Cirrhosis   . Degenerative joint disease   . Hypertension   . Gastric ulcer   . Depression   . Asthma   . Mitral valve prolapse 2002  . H/O hiatal hernia   . COPD (chronic obstructive pulmonary disease)   . TIA (transient ischemic attack)     2010  . Stroke     TIA - 2010 - no deficits   . Shortness of breath     with exertion   . GERD (gastroesophageal reflux disease)   . Headache(784.0)   . Hiatal hernia 1982  . Degenerative joint disease     Past Surgical History  Procedure Laterality Date  . Gastrectomy    . Shoulder surgery Bilateral     3 surgeries on on left, 2 surgeries on right   . Rt knee arthroscopic surgery    . Back surgery      3 cervical spine surgeries C4-C5 fused  .  Hernia repair    . Finger surgery Left     2nd, 3rd, & 4th fingers were cut off by table saw and reattached  . Colonoscopy N/A 01/04/2014    Procedure: COLONOSCOPY;  Surgeon: Danie Binder, MD;  Location: [facility];  Service: Endoscopy;  Laterality: N/A;  1:45  . Esophagogastroduodenoscopy N/A 01/04/2014    Procedure: ESOPHAGOGASTRODUODENOSCOPY (EGD);  Surgeon: Danie Binder, MD;  Location: [facility];  Service: Endoscopy;  Laterality: N/A;  . Incisional hernia repair N/A 01/20/2014    Procedure: LAPAROSCOPIC RECURRENT  INCISIONAL HERNIA with mesh;  Surgeon: Edward Jolly, MD;  Location: [facility] ORS;  Service: General;  Laterality: N/A;    Family History:  Family History  Problem Relation Age of Onset  . Cancer Father     bone  . Cancer Brother     lungs  . Stroke Maternal Grandmother   .  Colon cancer Neg Hx   . Asthma Son     died at age 30 in his sleep   . Spina bifida Son     died at age 70     Social History:  reports that he has been smoking Cigarettes.  He started smoking about 48 years ago. He has a 45 pack-year smoking history. He has never used smokeless tobacco. He reports that he drinks about 3.6 oz of alcohol per week. He reports that he does not use illicit drugs.  Additional Social History:  Alcohol / Drug Use Pain Medications: Pt denies Prescriptions: Trazodone, Abilify, Prozac Over the Counter: Pt denies History of alcohol / drug use?: Yes Longest period of sobriety (when/how long): 6 months Negative Consequences of Use: Financial, Scientist, research (physical sciences), Work / School Withdrawal Symptoms: Agitation, Irritability, Diarrhea, Weakness Substance #1 Name of Substance 1: Alcohol 1 - Age of First Use: 19 1 - Amount (size/oz): a case a day 1 - Frequency: daily 1 - Duration: ongoing 1 - Last Use / Amount: 07/20/14  CIWA: CIWA-Ar BP: 135/67 mmHg Pulse Rate: 89 COWS:    PATIENT STRENGTHS: (choose at least two) Communication skills Motivation for treatment/growth  Allergies:  Allergies  Allergen Reactions  . Bee Venom Anaphylaxis  . Penicillins Rash    Home Medications:  (Not in a hospital admission)  OB/GYN Status:  No LMP for male patient.  General Assessment Data Location of Assessment: [facility] ACT Assessment: Yes Is this a Tele or Face-to-Face Assessment?: Tele Assessment Is this an Initial Assessment or a Re-assessment for this encounter?: Initial Assessment Living Arrangements: Other (Comment) (Homeless) Can pt return to current living arrangement?: Yes Admission Status: Voluntary Is patient capable of signing voluntary admission?: Yes Transfer from: Home Referral Source: Self/Family/Friend     [facility] Living Arrangements: Other (Comment) (Homeless) Name of Psychiatrist: Warden/ranger Name of Therapist: [facility]  Education Status Is  patient currently in school?: No Current Grade: NA Highest grade of school patient has completed: 8 Name of school: NA Contact person: NA  Risk to self with the past 6 months Suicidal Ideation: Yes-Currently Present Suicidal Intent: Yes-Currently Present Is patient at risk for suicide?: Yes Suicidal Plan?: Yes-Currently Present Specify Current Suicidal Plan: To overdose Access to Means: Yes Specify Access to Suicidal Means: Has prescriptions What has been your use of drugs/alcohol within the last 12 months?: alcohol Previous Attempts/Gestures: Yes How many times?: 1 Other Self Harm Risks: None Triggers for Past Attempts: Other (Comment) (death of family members) Intentional Self Injurious Behavior: None Family Suicide History: No Recent  stressful life event(s): Other (Comment) (death of family members) Persecutory voices/beliefs?: No Depression: Yes Depression Symptoms: Insomnia, Tearfulness, Isolating, Fatigue, Guilt, Loss of interest in usual pleasures, Feeling worthless/self pity, Feeling angry/irritable Substance abuse history and/or treatment for substance abuse?: Yes Suicide prevention information given to non-admitted patients: Not applicable  Risk to Others within the past 6 months Homicidal Ideation: No Thoughts of Harm to Others: No Current Homicidal Intent: No Current Homicidal Plan: No Access to Homicidal Means: No Identified Victim: None History of harm to others?: No Assessment of Violence: None Noted Violent Behavior Description: None Does patient have access to weapons?: No Criminal Charges Pending?: No Does patient have a court date: No  Psychosis Hallucinations: None noted Delusions: None noted  Mental Status Report Appear/Hygiene: In scrubs, Unremarkable Eye Contact: Good Motor Activity: Freedom of movement Speech: Logical/coherent Level of Consciousness: Alert Mood: Depressed Affect: Appropriate to circumstance Anxiety Level: Moderate Thought  Processes: Coherent, Relevant Judgement: Impaired Orientation: Person, Place, Time, Situation Obsessive Compulsive Thoughts/Behaviors: None  Cognitive Functioning Concentration: Good Memory: Recent Intact, Remote Intact IQ: Average Insight: Poor Impulse Control: Poor Appetite: Fair Weight Loss: 0 Weight Gain: 0 Sleep: Decreased Total Hours of Sleep: 5 Vegetative Symptoms: None  ADLScreening [facility] Assessment Services) Patient's cognitive ability adequate to safely complete daily activities?: Yes Patient able to express need for assistance with ADLs?: Yes Independently performs ADLs?: Yes (appropriate for developmental age)  Prior Inpatient Therapy Prior Inpatient Therapy: Yes Prior Therapy Dates: Unknown Prior Therapy Facilty/Provider(s): Unknown Reason for Treatment: detox  Prior Outpatient Therapy Prior Outpatient Therapy: Yes Prior Therapy Dates: 2015 Prior Therapy Facilty/Provider(s): [facility] Reason for Treatment: Depression  ADL Screening (condition at time of admission) Patient's cognitive ability adequate to safely complete daily activities?: Yes Is the patient deaf or have difficulty hearing?: No Does the patient have difficulty seeing, even when wearing glasses/contacts?: No Does the patient have difficulty concentrating, remembering, or making decisions?: No Patient able to express need for assistance with ADLs?: Yes Does the patient have difficulty dressing or bathing?: No Independently performs ADLs?: Yes (appropriate for developmental age) Does the patient have difficulty walking or climbing stairs?: No Weakness of Legs: None Weakness of Arms/Hands: None       Abuse/Neglect Assessment (Assessment to be complete while patient is alone) Physical Abuse: Denies Verbal Abuse: Denies Sexual Abuse: Denies Exploitation of patient/patient's resources: Denies Self-Neglect: Denies Values / Beliefs Cultural Requests During Hospitalization: None Spiritual  Requests During Hospitalization: None   Advance Directives (For Healthcare) Does patient have an advance directive?: No Would patient like information on creating an advanced directive?: No - patient declined information    Additional Information 1:1 In Past 12 Months?: No CIRT Risk: No Elopement Risk: No Does patient have medical clearance?: Yes     Disposition:  Disposition Initial Assessment Completed for this Encounter: Yes Disposition of Patient: Inpatient treatment program  Emelio Schneller D 07/20/2014 5:46 PM

## 2014-07-20 NOTE — ED Notes (Addendum)
Pt arrived by [facility] from home with parents c/o CP, SOB, SI and overdose. Around 11am today pt self administered 14 Trazodone 100mg  each, 12 Nighttime Cold and flu pills ( TOTAL: Tylenol 3,900mg , Dextromethorphan HBr 180mg , Doxylamine Succinate 75mg ). Pt also drank 1 case of beer between 10am and 1430pm and 1 shot of vodka. Pt stated that he was trying to hurt himself and that he just wants to go to sleep and not wake up. EMS administered ASA 324mg  and Nitro x2. Pt denies any pain at this time. 12 lead unremarkable.

## 2014-07-20 NOTE — ED Provider Notes (Signed)
CSN: [id number]     Arrival date & time 07/20/14  1613 History   First MD Initiated Contact with Patient 07/20/14 1627     Chief Complaint  Patient presents with  . Chest Pain  . Suicidal  . Drug Overdose     (Consider location/radiation/quality/duration/timing/severity/associated sxs/prior Treatment) HPI Comments: Overdose on trazodone, cold pills, beers, vodka about 6 hours ago. Was trying to kill himself.  Patient is a 58 y.o. male presenting with chest pain and Overdose. The history is provided by the patient.  Chest Pain Pain location:  Substernal area Pain quality: aching   Pain radiates to:  Does not radiate Pain radiates to the back: no   Pain severity:  No pain Onset quality:  Gradual Timing:  Constant Progression:  Resolved Chronicity:  New Context: not at rest   Relieved by:  Nothing Worsened by:  Nothing tried Associated symptoms: no abdominal pain, no cough, no fever, no shortness of breath and not vomiting   Drug Overdose Associated symptoms include chest pain. Pertinent negatives include no abdominal pain and no shortness of breath.    Past Medical History  Diagnosis Date  . Cirrhosis   . Degenerative joint disease   . Hypertension   . Gastric ulcer   . Depression   . Asthma   . Mitral valve prolapse 2002  . H/O hiatal hernia   . COPD (chronic obstructive pulmonary disease)   . TIA (transient ischemic attack)     2010  . Stroke     TIA - 2010 - no deficits   . Shortness of breath     with exertion   . GERD (gastroesophageal reflux disease)   . Headache(784.0)   . Hiatal hernia 1982  . Degenerative joint disease    Past Surgical History  Procedure Laterality Date  . Gastrectomy    . Shoulder surgery Bilateral     3 surgeries on on left, 2 surgeries on right   . Rt knee arthroscopic surgery    . Back surgery      3 cervical spine surgeries C4-C5 fused  . Hernia repair    . Finger surgery Left     2nd, 3rd, & 4th fingers were cut off by  table saw and reattached  . Colonoscopy N/A 01/04/2014    Procedure: COLONOSCOPY;  Surgeon: Danie Binder, MD;  Location: [facility];  Service: Endoscopy;  Laterality: N/A;  1:45  . Esophagogastroduodenoscopy N/A 01/04/2014    Procedure: ESOPHAGOGASTRODUODENOSCOPY (EGD);  Surgeon: Danie Binder, MD;  Location: [facility];  Service: Endoscopy;  Laterality: N/A;  . Incisional hernia repair N/A 01/20/2014    Procedure: LAPAROSCOPIC RECURRENT  INCISIONAL HERNIA with mesh;  Surgeon: Edward Jolly, MD;  Location: [facility];  Service: General;  Laterality: N/A;   Family History  Problem Relation Age of Onset  . Cancer Father     bone  . Cancer Brother     lungs  . Stroke Maternal Grandmother   . Colon cancer Neg Hx   . Asthma Son     died at age 72 in his sleep   . Spina bifida Son     died at age 53    History  Substance Use Topics  . Smoking status: Current Every Day Smoker -- 1.00 packs/day for 45 years    Types: Cigarettes    Start date: 07/30/1966  . Smokeless tobacco: Never Used  . Alcohol Use: 3.6 oz/week    6 Cans of  beer per week     Comment: 1 case of beer and 1 shot of vodka between 1030am-1430pm    Review of Systems  Constitutional: Negative for fever and chills.  Respiratory: Negative for cough and shortness of breath.   Cardiovascular: Positive for chest pain.  Gastrointestinal: Negative for vomiting and abdominal pain.  All other systems reviewed and are negative.     Allergies  Bee venom and Penicillins  Home Medications   Prior to Admission medications   Medication Sig Start Date End Date Taking? Authorizing Provider  albuterol (PROVENTIL HFA;VENTOLIN HFA) 108 (90 BASE) MCG/ACT inhaler Inhale 2 puffs into the lungs every 6 (six) hours as needed for wheezing or shortness of breath. 03/12/14   Encarnacion Slates, NP  albuterol-ipratropium (COMBIVENT) 18-103 MCG/ACT inhaler Inhale 2 puffs into the lungs 2 (two) times daily. For shortness of breath 03/12/14    Encarnacion Slates, NP  ARIPiprazole (ABILIFY) 5 MG tablet Take 1 tablet (5 mg total) by mouth daily. For mood control 03/12/14   Encarnacion Slates, NP  FLUoxetine (PROZAC) 20 MG capsule Take 1 capsule (20 mg total) by mouth 3 (three) times daily. For depression 03/12/14   Encarnacion Slates, NP  gabapentin (NEURONTIN) 300 MG capsule Take 1 capsule (300 mg total) by mouth 2 (two) times daily. For substance withdrawal syndrome 03/12/14   Encarnacion Slates, NP  metoprolol tartrate (LOPRESSOR) 25 MG tablet Take 1 tablet (25 mg total) by mouth 2 (two) times daily. For high blood pressure 03/12/14   Encarnacion Slates, NP  naproxen (NAPROSYN) 500 MG tablet Take 1 tablet (500 mg total) by mouth 2 (two) times daily with a meal. For knee pain 03/12/14   Encarnacion Slates, NP  traZODone (DESYREL) 100 MG tablet Take 2 tablets (200 mg total) by mouth at bedtime. For sleep 03/12/14   Encarnacion Slates, NP   BP 135/67 mmHg  Pulse 89  Temp(Src) 98.2 F (36.8 C) (Oral)  Resp 23  Ht 5\' 4"  (1.626 m)  Wt 155 lb (70.308 kg)  BMI 26.59 kg/m2  SpO2 99% Physical Exam  Constitutional: He is oriented to person, place, and time. He appears well-developed and well-nourished. No distress.  HENT:  Head: Normocephalic and atraumatic.  Mouth/Throat: Oropharynx is clear and moist. No oropharyngeal exudate.  Eyes: EOM are normal. Pupils are equal, round, and reactive to light.  Neck: Normal range of motion. Neck supple.  Cardiovascular: Normal rate and regular rhythm.  Exam reveals no friction rub.   No murmur heard. Pulmonary/Chest: Effort normal and breath sounds normal. No respiratory distress. He has no wheezes. He has no rales.  Abdominal: Soft. He exhibits no distension. There is no tenderness. There is no rebound.  Musculoskeletal: Normal range of motion. He exhibits no edema.  Neurological: He is alert and oriented to person, place, and time. No cranial nerve deficit. He exhibits normal muscle tone. Coordination normal.  Skin: No rash noted. He  is not diaphoretic.  Nursing note and vitals reviewed.   ED Course  Procedures (including critical care time) Labs Review Labs Reviewed  CBC - Abnormal; Notable for the following:    WBC 13.5 (*)    RBC 3.60 (*)    Hemoglobin 10.6 (*)    HCT 31.4 (*)    Platelets 482 (*)    All other components within normal limits  BASIC METABOLIC PANEL - Abnormal; Notable for the following:    Sodium 128 (*)    Chloride 91 (*)  BUN <5 (*)    Calcium 8.1 (*)    All other components within normal limits  ETHANOL - Abnormal; Notable for the following:    Alcohol, Ethyl (B) 151 (*)    All other components within normal limits  ACETAMINOPHEN LEVEL - Abnormal; Notable for the following:    Acetaminophen (Tylenol), Serum <10.0 (*)    All other components within normal limits  URINALYSIS, ROUTINE W REFLEX MICROSCOPIC - Abnormal; Notable for the following:    Specific Gravity, Urine 1.004 (*)    Ketones, ur 15 (*)    All other components within normal limits  SALICYLATE LEVEL  URINE RAPID DRUG SCREEN (HOSP PERFORMED)  TROPONIN I  TROPONIN I  HEPATIC FUNCTION PANEL    Imaging Review No results found.   EKG Interpretation   Date/Time:  Tuesday July 20 2014 21:24:39 EST Ventricular Rate:  103 PR Interval:  111 QRS Duration: 103 QT Interval:  379 QTC Calculation: 496 R Axis:   68 Text Interpretation:  Sinus tachycardia Ventricular premature complex  Aberrant complex Probable left atrial enlargement Abnormal R-wave  progression, early transition Borderline prolonged QT interval Similar to  prior Confirmed by Mingo Amber  MD, Maryclare Nydam ([id number]) on 07/20/2014 11:27:23 PM      MDM   Final diagnoses:  Fever  Drug overdose, intentional self-harm, initial encounter    58 year old male here with chest pain and an overdose. Wanted to die, was trying to kill himself. He took 14 trazodone, 12 nighttime cold and flu pills, drinking case of beer, and 1 shot of vodka. He also had chest pain is  alleviated with aspirin and nitroglycerin. He is pain-free at this time. History of suicide attempt previously by cutting wrists.  Here pain free, vital stable. Poison control recommended observation. Will obtain serial troponins and monitor for an anti-cholinergic toxidrome. Troponins ok. Tylenol level not detected. UDS ok. Late in the course of his observation, patient became febrile, tachycardic, diaphoretic. Repeat EKG ok.  I spoke with [facility], who felt his sympathomimetic symptoms were beginning to show, likely from the dextromethorphan. [place] recommended admission.   Evelina Bucy, MD 07/20/14 [phone]

## 2014-07-20 NOTE — ED Notes (Addendum)
Poison control notified, recommend to monitor pt on cardiac monitor and supportive therapy for symptoms of SOB. Monitor for seizure activity, hypotension, urine output and EKG changes. Appropriate time to monitor pt would be 6-8hrs. Poison control will be calling to follow up on pt.

## 2014-07-20 NOTE — ED Notes (Signed)
Patient in wine scrub and sitter at bedside.

## 2014-07-20 NOTE — [facility] Counselor (Signed)
Assessment complete.  Counselor consulted with NP-Jamison. Per Theodoro Clock the Pt meets inpatient criteria. TTS to seek placement.  Lorenza Cambridge, [facility] Triage Specialist

## 2014-07-20 NOTE — ED Notes (Signed)
Pt vomited in bathroom, on wall and floor.

## 2014-07-21 ENCOUNTER — Encounter (HOSPITAL_COMMUNITY): Payer: Self-pay | Admitting: Internal Medicine

## 2014-07-21 DIAGNOSIS — F10220 Alcohol dependence with intoxication, uncomplicated: Secondary | ICD-10-CM

## 2014-07-21 DIAGNOSIS — J449 Chronic obstructive pulmonary disease, unspecified: Secondary | ICD-10-CM

## 2014-07-21 DIAGNOSIS — R509 Fever, unspecified: Secondary | ICD-10-CM

## 2014-07-21 DIAGNOSIS — T50902D Poisoning by unspecified drugs, medicaments and biological substances, intentional self-harm, subsequent encounter: Secondary | ICD-10-CM

## 2014-07-21 LAB — CBC WITH DIFFERENTIAL/PLATELET
Basophils Absolute: 0 10*3/uL (ref 0.0–0.1)
Basophils Relative: 0 % (ref 0–1)
EOS PCT: 0 % (ref 0–5)
Eosinophils Absolute: 0 10*3/uL (ref 0.0–0.7)
HEMATOCRIT: 31.3 % — AB (ref 39.0–52.0)
Hemoglobin: 10.2 g/dL — ABNORMAL LOW (ref 13.0–17.0)
LYMPHS ABS: 1.5 10*3/uL (ref 0.7–4.0)
LYMPHS PCT: 14 % (ref 12–46)
MCH: 28.9 pg (ref 26.0–34.0)
MCHC: 32.6 g/dL (ref 30.0–36.0)
MCV: 88.7 fL (ref 78.0–100.0)
MONO ABS: 1.4 10*3/uL — AB (ref 0.1–1.0)
Monocytes Relative: 13 % — ABNORMAL HIGH (ref 3–12)
Neutro Abs: 7.9 10*3/uL — ABNORMAL HIGH (ref 1.7–7.7)
Neutrophils Relative %: 74 % (ref 43–77)
Platelets: 466 10*3/uL — ABNORMAL HIGH (ref 150–400)
RBC: 3.53 MIL/uL — AB (ref 4.22–5.81)
RDW: 14.9 % (ref 11.5–15.5)
WBC: 10.7 10*3/uL — AB (ref 4.0–10.5)

## 2014-07-21 LAB — COMPREHENSIVE METABOLIC PANEL
ALT: 12 U/L (ref 0–53)
AST: 16 U/L (ref 0–37)
Albumin: 2.4 g/dL — ABNORMAL LOW (ref 3.5–5.2)
Alkaline Phosphatase: 92 U/L (ref 39–117)
Anion gap: 11 (ref 5–15)
BILIRUBIN TOTAL: 0.6 mg/dL (ref 0.3–1.2)
BUN: 7 mg/dL (ref 6–23)
CALCIUM: 8 mg/dL — AB (ref 8.4–10.5)
CHLORIDE: 90 meq/L — AB (ref 96–112)
CO2: 27 mmol/L (ref 19–32)
CREATININE: 0.83 mg/dL (ref 0.50–1.35)
GFR calc Af Amer: >90 mL/min (ref >90)
GFR calc non Af Amer: >90 mL/min (ref >90)
Glucose, Bld: 90 mg/dL (ref 70–99)
Potassium: 3.9 mmol/L (ref 3.5–5.1)
Sodium: 128 mmol/L — ABNORMAL LOW (ref 135–145)
Total Protein: 5.7 g/dL — ABNORMAL LOW (ref 6.0–8.3)

## 2014-07-21 LAB — MRSA PCR SCREENING: MRSA by PCR: NEGATIVE

## 2014-07-21 LAB — TSH: TSH: 0.628 u[IU]/mL (ref 0.350–4.500)

## 2014-07-21 LAB — TROPONIN I: Troponin I: <0.03 ng/mL (ref <0.031)

## 2014-07-21 MED ORDER — PNEUMOCOCCAL VAC POLYVALENT 25 MCG/0.5ML IJ INJ
0.5000 mL | INJECTION | INTRAMUSCULAR | Status: AC
  Administered 2014-07-23: 0.5 mL via INTRAMUSCULAR
  Filled 2014-07-21 (×2): qty 0.5

## 2014-07-21 MED ORDER — INFLUENZA VAC SPLIT QUAD 0.5 ML IM SUSY
0.5000 mL | PREFILLED_SYRINGE | INTRAMUSCULAR | Status: AC
  Administered 2014-07-23: 0.5 mL via INTRAMUSCULAR
  Filled 2014-07-21 (×2): qty 0.5

## 2014-07-21 MED ORDER — IPRATROPIUM-ALBUTEROL 18-103 MCG/ACT IN AERO: 2.0000 | INHALATION_SPRAY | Freq: Two times a day (BID) | RESPIRATORY_TRACT | Status: DC

## 2014-07-21 MED ORDER — THIAMINE HCL 100 MG/ML IJ SOLN
100.0000 mg | Freq: Every day | INTRAMUSCULAR | Status: DC
  Administered 2014-07-22: 100 mg via INTRAVENOUS
  Filled 2014-07-21 (×2): qty 1

## 2014-07-21 MED ORDER — IPRATROPIUM-ALBUTEROL 0.5-2.5 (3) MG/3ML IN SOLN
3.0000 mL | Freq: Two times a day (BID) | RESPIRATORY_TRACT | Status: DC
  Administered 2014-07-21 – 2014-07-23 (×5): 3 mL via RESPIRATORY_TRACT
  Filled 2014-07-21 (×5): qty 3

## 2014-07-21 MED ORDER — LEVOFLOXACIN IN D5W 500 MG/100ML IV SOLN
500.0000 mg | INTRAVENOUS | Status: DC
  Administered 2014-07-21 – 2014-07-22 (×2): 500 mg via INTRAVENOUS
  Filled 2014-07-21 (×2): qty 100

## 2014-07-21 MED ORDER — ARIPIPRAZOLE 10 MG PO TABS
5.0000 mg | ORAL_TABLET | Freq: Every day | ORAL | Status: DC
  Administered 2014-07-21: 5 mg via ORAL

## 2014-07-21 MED ORDER — ENOXAPARIN SODIUM 40 MG/0.4ML ~~LOC~~ SOLN
40.0000 mg | SUBCUTANEOUS | Status: DC
  Administered 2014-07-21 – 2014-07-23 (×3): 40 mg via SUBCUTANEOUS
  Filled 2014-07-21 (×3): qty 0.4

## 2014-07-21 MED ORDER — ADULT MULTIVITAMIN W/MINERALS CH
1.0000 | ORAL_TABLET | Freq: Every day | ORAL | Status: DC
  Administered 2014-07-21 – 2014-07-23 (×3): 1 via ORAL
  Filled 2014-07-21 (×3): qty 1

## 2014-07-21 MED ORDER — SODIUM CHLORIDE 0.9 % IV SOLN
INTRAVENOUS | Status: AC
  Administered 2014-07-21 (×3): via INTRAVENOUS

## 2014-07-21 MED ORDER — ONDANSETRON HCL 4 MG PO TABS: 4.0000 mg | ORAL_TABLET | Freq: Four times a day (QID) | ORAL | Status: DC | PRN

## 2014-07-21 MED ORDER — VITAMIN B-1 100 MG PO TABS
100.0000 mg | ORAL_TABLET | Freq: Every day | ORAL | Status: DC
  Administered 2014-07-21 – 2014-07-23 (×3): 100 mg via ORAL
  Filled 2014-07-21 (×3): qty 1

## 2014-07-21 MED ORDER — LORAZEPAM 2 MG/ML IJ SOLN
0.0000 mg | Freq: Two times a day (BID) | INTRAMUSCULAR | Status: DC
  Filled 2014-07-21 (×2): qty 1

## 2014-07-21 MED ORDER — ONDANSETRON HCL 4 MG/2ML IJ SOLN
4.0000 mg | Freq: Four times a day (QID) | INTRAMUSCULAR | Status: DC | PRN
  Administered 2014-07-22 – 2014-07-24 (×3): 4 mg via INTRAVENOUS
  Filled 2014-07-21 (×3): qty 2

## 2014-07-21 MED ORDER — LORAZEPAM 2 MG/ML IJ SOLN
0.0000 mg | Freq: Four times a day (QID) | INTRAMUSCULAR | Status: DC
  Administered 2014-07-21: 1 mg via INTRAVENOUS
  Administered 2014-07-22: 2 mg via INTRAVENOUS
  Administered 2014-07-22 (×2): 1 mg via INTRAVENOUS
  Administered 2014-07-23 (×2): 2 mg via INTRAVENOUS
  Filled 2014-07-21 (×4): qty 1

## 2014-07-21 MED ORDER — LORAZEPAM 2 MG/ML IJ SOLN
1.0000 mg | Freq: Four times a day (QID) | INTRAMUSCULAR | Status: DC | PRN
  Administered 2014-07-21 – 2014-07-23 (×2): 1 mg via INTRAVENOUS
  Filled 2014-07-21 (×2): qty 1

## 2014-07-21 MED ORDER — LORAZEPAM 1 MG PO TABS
1.0000 mg | ORAL_TABLET | Freq: Four times a day (QID) | ORAL | Status: DC | PRN
  Administered 2014-07-22 – 2014-07-23 (×2): 1 mg via ORAL
  Filled 2014-07-21 (×3): qty 1

## 2014-07-21 MED ORDER — ACETAMINOPHEN 650 MG RE SUPP: 650.0000 mg | Freq: Four times a day (QID) | RECTAL | Status: DC | PRN

## 2014-07-21 MED ORDER — IPRATROPIUM BROMIDE 0.02 % IN SOLN: 0.5000 mg | Freq: Two times a day (BID) | RESPIRATORY_TRACT | Status: DC

## 2014-07-21 MED ORDER — ACETAMINOPHEN 325 MG PO TABS
650.0000 mg | ORAL_TABLET | Freq: Four times a day (QID) | ORAL | Status: DC | PRN
Start: 2014-07-21 — End: 2014-07-27
  Administered 2014-07-21 – 2014-07-27 (×6): 650 mg via ORAL
  Filled 2014-07-21 (×6): qty 2

## 2014-07-21 MED ORDER — FOLIC ACID 1 MG PO TABS
1.0000 mg | ORAL_TABLET | Freq: Every day | ORAL | Status: DC
  Administered 2014-07-21 – 2014-07-23 (×3): 1 mg via ORAL
  Filled 2014-07-21 (×3): qty 1

## 2014-07-21 NOTE — Progress Notes (Signed)
58yo male presents s/p suicide attempt w/ OD on trazodone, "cold pills", beer/vodka, also c/o CP, CXR suspicious for bronchiolitis, to begin IV ABX.  Will begin Levaquin 500mg  IV Q24H and monitor CBC and Cx.  Wynona Neat, PharmD, BCPS 07/21/2014 3:27 AM

## 2014-07-21 NOTE — Care Management Note (Signed)
    Page 1 of 1   07/21/2014     10:34:55 AM CARE MANAGEMENT NOTE 07/21/2014  Patient:  Samuel Reyes, Samuel Reyes   Account Number:  [id number]  Date Initiated:  07/21/2014  Documentation initiated by:  Elissa Hefty  Subjective/Objective Assessment:   adm w ch pain, overdose, inc wbc     Action/Plan:   lives alone, pcp dr Annitta Needs   Anticipated DC Date:     Anticipated DC Plan:  HOME/SELF CARE  In-house referral  Clinical Social Worker         Choice offered to / List presented to:             Status of service:   Medicare Important Message given?   (If response is "NO", the following Medicare IM given date fields will be blank) Date Medicare IM given:   Medicare IM given by:   Date Additional Medicare IM given:   Additional Medicare IM given by:    Discharge Disposition:    Per UR Regulation:  Reviewed for med. necessity/level of care/duration of stay  If discussed at [place] of Stay Meetings, dates discussed:    Comments:

## 2014-07-21 NOTE — Consult Note (Signed)
[facility] Face-to-Face Psychiatry Consult   Reason for Consult:  Intentional overdose Referring Physician:  Dr. Tana Coast  Samuel Reyes is an 58 y.o. male. Total Time spent with patient: 45 minutes  Assessment: AXIS I:  Alcohol Abuse and Major Depression, Recurrent severe; alcohol dependence with uncomplicated withdrawal AXIS II:  Deferred AXIS III:   Past Medical History  Diagnosis Date  . Cirrhosis   . Degenerative joint disease   . Hypertension   . Gastric ulcer   . Depression   . Asthma   . Mitral valve prolapse 2002  . H/O hiatal hernia   . COPD (chronic obstructive pulmonary disease)   . TIA (transient ischemic attack)     2010  . Stroke     TIA - 2010 - no deficits   . Shortness of breath     with exertion   . GERD (gastroesophageal reflux disease)   . Headache(784.0)   . Hiatal hernia 1982  . Degenerative joint disease    AXIS IV:  other psychosocial or environmental problems, problems related to social environment and problems with primary support group AXIS V:  21-30 behavior considerably influenced by delusions or hallucinations OR serious impairment in judgment, communication OR inability to function in almost all areas  Plan:  Recommend psychiatric Inpatient admission when medically cleared.  Continue Ativan alcohol detox protocol.  Subjective:   Samuel Reyes is a 58 y.o. male patient admitted with intentional overdose.  HPI:  Patient was not feeling well, physically, on assessment.  He does admit to overdosing on Trazodone and Abilify in an attempt to end his life.  Remains depressed with suicidal ideations.  Denies homicidal ideations, hallucinations, and drug abuse.  He drinks daily, usually 2-3 large beers daily but the day he overdosed, he drank a case of beer.  Samuel Reyes [place] been [place] the woods, "camping out".  His mother "won't do anything for me until I get help for my drinking."  He goes to [facility] Reyes, [facility] Reyes, for his medication--Prozac 80 mg daily,  and therapy. HPI Elements:   Location:  generalized. Quality:  acute. Severity:  severe. Timing:  intermittent. Duration:  couple of weeks. Context:  stressors.  Past Psychiatric History: Past Medical History  Diagnosis Date  . Cirrhosis   . Degenerative joint disease   . Hypertension   . Gastric ulcer   . Depression   . Asthma   . Mitral valve prolapse 2002  . H/O hiatal hernia   . COPD (chronic obstructive pulmonary disease)   . TIA (transient ischemic attack)     2010  . Stroke     TIA - 2010 - no deficits   . Shortness of breath     with exertion   . GERD (gastroesophageal reflux disease)   . Headache(784.0)   . Hiatal hernia 1982  . Degenerative joint disease     reports that he has been smoking Cigarettes.  He started smoking about 48 years ago. He has a 45 pack-year smoking history. He has never used smokeless tobacco. He reports that he drinks about 3.6 oz of alcohol per week. He reports that he does not use illicit drugs. Family History  Problem Relation Age of Onset  . Cancer Father     bone  . Cancer Brother     lungs  . Stroke Maternal Grandmother   . Colon cancer Neg Hx   . Asthma Son     died at age 69 in his sleep   . Spina  bifida Son     died at age 101    Family History Substance Abuse: Yes, Describe: Family Supports: Yes, List: (mom) Living Arrangements: Alone Can pt return to current living arrangement?: Yes Abuse/Neglect Samuel Reyes) Physical Abuse: Denies Verbal Abuse: Denies Sexual Abuse: Denies Allergies:   Allergies  Allergen Reactions  . Bee Venom Anaphylaxis  . Penicillins Rash    ACT Assessment Complete:  No:   Past Psychiatric History: Diagnosis:  Major depression; alcohol dependence  Hospitalizations:  Yes  Outpatient Care:  Samuel Reyes  Substance Abuse Care:  Rehabs and detoxs in the past  Self-Mutilation:  None  Suicidal Attempts:  Once before by cutting his wrist  Homicidal Behaviors:  No   Violent Behaviors:  No   Place of  Residence:  Samuel Reyes Marital Status:  Single Employed/Unemployed:  Unemployed Education:  High school Family Supports:  Sister, mother Objective: Blood pressure 155/79, pulse 75, temperature 98.3 F (36.8 C), temperature source Oral, resp. rate 23, height _0  (1.651 m), weight 152 lb (68.947 kg), SpO2 90 %.Body mass index is 25.29 kg/(m^2). Results for orders placed or performed during the Reyes encounter of 07/20/14 (from the past 72 hour(s))  CBC     Status: Abnormal   Collection Time: 07/20/14  5:50 PM  Result Value Ref Range   WBC 13.5 (H) 4.0 - 10.5 K/uL   RBC 3.60 (L) 4.22 - 5.81 MIL/uL   Hemoglobin 10.6 (L) 13.0 - 17.0 g/dL   HCT 31.4 (L) 39.0 - 52.0 %   MCV 87.2 78.0 - 100.0 fL   MCH 29.4 26.0 - 34.0 pg   MCHC 33.8 30.0 - 36.0 g/dL   RDW 14.6 11.5 - 15.5 %   Platelets 482 (H) 150 - 400 K/uL  Basic metabolic panel     Status: Abnormal   Collection Time: 07/20/14  5:50 PM  Result Value Ref Range   Sodium 128 (L) 135 - 145 mmol/L    Comment: Please note change in reference range.   Potassium 4.3 3.5 - 5.1 mmol/L    Comment: Please note change in reference range.   Chloride 91 (L) 96 - 112 mEq/L   CO2 25 19 - 32 mmol/L   Glucose, Bld 86 70 - 99 mg/dL   BUN <5 (L) 6 - 23 mg/dL    Comment: REPEATED TO VERIFY   Creatinine, Ser 0.62 0.50 - 1.35 mg/dL   Calcium 8.1 (L) 8.4 - 10.5 mg/dL   GFR calc non Af Amer >90 >90 mL/min   GFR calc Af Amer >90 >90 mL/min    Comment: (NOTE) The eGFR has been calculated using the CKD EPI equation. This calculation has not been validated in all clinical situations. eGFR's persistently <90 mL/min signify possible Chronic Kidney Disease.    Anion gap 12 5 - 15  Ethanol     Status: Abnormal   Collection Time: 07/20/14  5:50 PM  Result Value Ref Range   Alcohol, Ethyl (B) 151 (H) 0 - 9 mg/dL    Comment:        LOWEST DETECTABLE LIMIT FOR SERUM ALCOHOL IS 11 mg/dL FOR MEDICAL PURPOSES ONLY   Salicylate level     Status: None    Collection Time: 07/20/14  5:50 PM  Result Value Ref Range   Salicylate Lvl <0.9 2.8 - 20.0 mg/dL  Acetaminophen level     Status: Abnormal   Collection Time: 07/20/14  5:50 PM  Result Value Ref Range   Acetaminophen (Tylenol), Serum <10.0 (L)  10 - 30 ug/mL    Comment:        THERAPEUTIC CONCENTRATIONS VARY SIGNIFICANTLY. A RANGE OF 10-30 ug/mL MAY BE AN EFFECTIVE CONCENTRATION FOR MANY PATIENTS. HOWEVER, SOME ARE BEST TREATED AT CONCENTRATIONS OUTSIDE THIS RANGE. ACETAMINOPHEN CONCENTRATIONS >150 ug/mL AT 4 HOURS AFTER INGESTION AND >50 ug/mL AT 12 HOURS AFTER INGESTION ARE OFTEN ASSOCIATED WITH TOXIC REACTIONS.   Troponin I     Status: None   Collection Time: 07/20/14  5:50 PM  Result Value Ref Range   Troponin I <0.03 <0.031 ng/mL    Comment:        NO INDICATION OF MYOCARDIAL INJURY. Please note change in reference range.   Drug screen panel, emergency     Status: None   Collection Time: 07/20/14  6:15 PM  Result Value Ref Range   Opiates NONE DETECTED NONE DETECTED   Cocaine NONE DETECTED NONE DETECTED   Benzodiazepines NONE DETECTED NONE DETECTED   Amphetamines NONE DETECTED NONE DETECTED   Tetrahydrocannabinol NONE DETECTED NONE DETECTED   Barbiturates NONE DETECTED NONE DETECTED    Comment:        DRUG SCREEN FOR MEDICAL PURPOSES ONLY.  IF CONFIRMATION IS NEEDED FOR ANY PURPOSE, NOTIFY LAB WITHIN 5 DAYS.        LOWEST DETECTABLE LIMITS FOR URINE DRUG SCREEN Drug Class       Cutoff (ng/mL) Amphetamine      1000 Barbiturate      200 Benzodiazepine   431 Tricyclics       540 Opiates          300 Cocaine          300 THC              50   Urinalysis, Routine w reflex microscopic     Status: Abnormal   Collection Time: 07/20/14  6:15 PM  Result Value Ref Range   Color, Urine YELLOW YELLOW   APPearance CLEAR CLEAR   Specific Gravity, Urine 1.004 (L) 1.005 - 1.030   pH 6.5 5.0 - 8.0   Glucose, UA NEGATIVE NEGATIVE mg/dL   Hgb urine dipstick NEGATIVE  NEGATIVE   Bilirubin Urine NEGATIVE NEGATIVE   Ketones, ur 15 (A) NEGATIVE mg/dL   Protein, ur NEGATIVE NEGATIVE mg/dL   Urobilinogen, UA 0.2 0.0 - 1.0 mg/dL   Nitrite NEGATIVE NEGATIVE   Leukocytes, UA NEGATIVE NEGATIVE    Comment: MICROSCOPIC NOT DONE ON URINES WITH NEGATIVE PROTEIN, BLOOD, LEUKOCYTES, NITRITE, OR GLUCOSE <1000 mg/dL.  Troponin I     Status: None   Collection Time: 07/20/14  8:46 PM  Result Value Ref Range   Troponin I <0.03 <0.031 ng/mL    Comment:        NO INDICATION OF MYOCARDIAL INJURY. Please note change in reference range.   Hepatic function panel     Status: Abnormal   Collection Time: 07/20/14 11:26 PM  Result Value Ref Range   Total Protein 6.1 6.0 - 8.3 g/dL   Albumin 2.7 (L) 3.5 - 5.2 g/dL   AST 23 0 - 37 U/L   ALT 13 0 - 53 U/L   Alkaline Phosphatase 97 39 - 117 U/L   Total Bilirubin 0.3 0.3 - 1.2 mg/dL   Bilirubin, Direct <0.1 0.0 - 0.3 mg/dL   Indirect Bilirubin NOT CALCULATED 0.3 - 0.9 mg/dL  MRSA PCR Screening     Status: None   Collection Time: 07/21/14  5:14 AM  Result Value Ref Range  MRSA by PCR NEGATIVE NEGATIVE    Comment:        The GeneXpert MRSA Assay (FDA approved for NASAL specimens only), is one component of a comprehensive MRSA colonization surveillance program. It is not intended to diagnose MRSA infection nor to guide or monitor treatment for MRSA infections.   TSH     Status: None   Collection Time: 07/21/14  6:56 AM  Result Value Ref Range   TSH 0.628 0.350 - 4.500 uIU/mL  Troponin I     Status: None   Collection Time: 07/21/14  6:56 AM  Result Value Ref Range   Troponin I <0.03 <0.031 ng/mL    Comment:        NO INDICATION OF MYOCARDIAL INJURY. Please note change in reference range.   Comprehensive metabolic panel     Status: Abnormal   Collection Time: 07/21/14  6:56 AM  Result Value Ref Range   Sodium 128 (L) 135 - 145 mmol/L    Comment: Please note change in reference range.   Potassium 3.9 3.5 -  5.1 mmol/L    Comment: Please note change in reference range.   Chloride 90 (L) 96 - 112 mEq/L   CO2 27 19 - 32 mmol/L   Glucose, Bld 90 70 - 99 mg/dL   BUN 7 6 - 23 mg/dL   Creatinine, Ser 0.83 0.50 - 1.35 mg/dL   Calcium 8.0 (L) 8.4 - 10.5 mg/dL   Total Protein 5.7 (L) 6.0 - 8.3 g/dL   Albumin 2.4 (L) 3.5 - 5.2 g/dL   AST 16 0 - 37 U/L   ALT 12 0 - 53 U/L   Alkaline Phosphatase 92 39 - 117 U/L   Total Bilirubin 0.6 0.3 - 1.2 mg/dL   GFR calc non Af Amer >90 >90 mL/min   GFR calc Af Amer >90 >90 mL/min    Comment: (NOTE) The eGFR has been calculated using the CKD EPI equation. This calculation has not been validated in all clinical situations. eGFR's persistently <90 mL/min signify possible Chronic Kidney Disease.    Anion gap 11 5 - 15  CBC with Differential     Status: Abnormal   Collection Time: 07/21/14  6:56 AM  Result Value Ref Range   WBC 10.7 (H) 4.0 - 10.5 K/uL   RBC 3.53 (L) 4.22 - 5.81 MIL/uL   Hemoglobin 10.2 (L) 13.0 - 17.0 g/dL   HCT 31.3 (L) 39.0 - 52.0 %   MCV 88.7 78.0 - 100.0 fL   MCH 28.9 26.0 - 34.0 pg   MCHC 32.6 30.0 - 36.0 g/dL   RDW 14.9 11.5 - 15.5 %   Platelets 466 (H) 150 - 400 K/uL   Neutrophils Relative % 74 43 - 77 %   Neutro Abs 7.9 (H) 1.7 - 7.7 K/uL   Lymphocytes Relative 14 12 - 46 %   Lymphs Abs 1.5 0.7 - 4.0 K/uL   Monocytes Relative 13 (H) 3 - 12 %   Monocytes Absolute 1.4 (H) 0.1 - 1.0 K/uL   Eosinophils Relative 0 0 - 5 %   Eosinophils Absolute 0.0 0.0 - 0.7 K/uL   Basophils Relative 0 0 - 1 %   Basophils Absolute 0.0 0.0 - 0.1 K/uL   Labs are reviewed and are pertinent for medical issues being addressed.  Current Facility-Administered Medications  Medication Dose Route Frequency Provider Last Rate Last Dose  . 0.9 %  sodium chloride infusion   Intravenous Continuous Rise Patience, MD  100 mL/hr at 07/21/14 1122    . acetaminophen (TYLENOL) tablet 650 mg  650 mg Oral Q6H PRN Rise Patience, MD       Or  .  acetaminophen (TYLENOL) suppository 650 mg  650 mg Rectal Q6H PRN Rise Patience, MD      . enoxaparin (LOVENOX) injection 40 mg  40 mg Subcutaneous Q24H Rise Patience, MD   40 mg at 07/21/14 0948  . folic acid (FOLVITE) tablet 1 mg  1 mg Oral Daily Rise Patience, MD   1 mg at 07/21/14 0948  . [START ON 07/22/2014] Influenza vac split quadrivalent PF (FLUARIX) injection 0.5 mL  0.5 mL Intramuscular Tomorrow-1000 Ripudeep K Rai, MD      . ipratropium-albuterol (DUONEB) 0.5-2.5 (3) MG/3ML nebulizer solution 3 mL  3 mL Nebulization BID Rise Patience, MD   3 mL at 07/21/14 0836  . levofloxacin (LEVAQUIN) IVPB 500 mg  500 mg Intravenous Q24H Rogue Bussing, RPH 100 mL/hr at 07/21/14 0400 500 mg at 07/21/14 0400  . LORazepam (ATIVAN) injection 0-4 mg  0-4 mg Intravenous Q6H Rise Patience, MD   1 mg at 07/21/14 9166   Followed by  . [START ON 07/23/2014] LORazepam (ATIVAN) injection 0-4 mg  0-4 mg Intravenous Q12H Rise Patience, MD      . LORazepam (ATIVAN) tablet 1 mg  1 mg Oral Q6H PRN Rise Patience, MD       Or  . LORazepam (ATIVAN) injection 1 mg  1 mg Intravenous Q6H PRN Rise Patience, MD      . multivitamin with minerals tablet 1 tablet  1 tablet Oral Daily Rise Patience, MD   1 tablet at 07/21/14 [phone]  . ondansetron (ZOFRAN) tablet 4 mg  4 mg Oral Q6H PRN Rise Patience, MD       Or  . ondansetron [facility]) injection 4 mg  4 mg Intravenous Q6H PRN Rise Patience, MD      . Derrill Memo ON 07/22/2014] pneumococcal 23 valent vaccine (PNU-IMMUNE) injection 0.5 mL  0.5 mL Intramuscular Tomorrow-1000 Ripudeep K Rai, MD      . thiamine (VITAMIN B-1) tablet 100 mg  100 mg Oral Daily Rise Patience, MD   100 mg at 07/21/14 4599   Or  . thiamine (B-1) injection 100 mg  100 mg Intravenous Daily Rise Patience, MD        Psychiatric Specialty Exam:     Blood pressure 155/79, pulse 75, temperature 98.3 F (36.8 C), temperature  source Oral, resp. rate 23, height _0  (1.651 m), weight 152 lb (68.947 kg), SpO2 90 %.Body mass index is 25.29 kg/(m^2).  General Appearance: Disheveled  Eye Sport and exercise psychologist::  Fair  Speech:  Normal Rate  Volume:  Decreased  Mood:  Depressed  Affect:  Congruent  Thought Process:  Coherent  Orientation:  Full (Time, Place, and Person)  Thought Content:  WDL  Suicidal Thoughts:  Yes.  with intent/plan  Homicidal Thoughts:  No  Memory:  Immediate;   Fair Recent;   Fair Remote;   Fair  Judgement:  Poor  Insight:  Fair  Psychomotor Activity:  Decreased  Concentration:  Fair  Recall:  [employer] of Knowledge:Fair  Language: Fair  Akathisia:  No  Handed:  Right  AIMS (if indicated):     Assets:  Leisure Time Resilience  Sleep:      Musculoskeletal: Strength & Muscle Tone: within normal limits Gait &  Station: normal Patient leans: N/A  Treatment Plan Summary: Admit to inpatient psychiatric unit for stabilization; continue Ativan alcohol detox protocol.  Waylan Boga, [place] 07/21/2014 11:49 AM   Case discussed with me as above

## 2014-07-21 NOTE — H&P (Signed)
[facility] History and Physical  Remus Hagedorn HBZ:[id number] DOB: 1956-02-04 DOA: 07/20/2014  Referring physician: ER Physician PCP: Lorayne Marek, MD   Chief Complaint: Drug overdose and suicidal ideation.  HPI: Samuel Reyes is a 58 y.o. male with history of major depression, COPD and ongoing alcohol and tobacco abuse presents to the ER because of drug overdose. Patient states that he has been having suicidal ideation and took 4 or 5 tablets of his Abilify and trazodone with the intention of hurting himself. Shortly patient called EMS and was brought to the ER. Patient also stated that after taking the medication he had 2-3 episodes of vomiting. Denies any abdominal pain or diarrhea. Patient also had taken Vodka along with the tablets. Denies having taken anything else. In the ER patient's salicylate levels and Tylenol levels were negative. Patient was tachycardic and became febrile. Poison control was contacted and they feel that patient may be having some sympathetic symptoms. At this time they have recommended to closely observe and give supportive care. Patient also drinks alcohol everyday and has potential to withdrawal and was given already Ativan. Patient has been placed on suicide precautions with a sitter.   Review of Systems: As presented in the history of presenting illness, rest negative.  Past Medical History  Diagnosis Date  . Cirrhosis   . Degenerative joint disease   . Hypertension   . Gastric ulcer   . Depression   . Asthma   . Mitral valve prolapse 2002  . H/O hiatal hernia   . COPD (chronic obstructive pulmonary disease)   . TIA (transient ischemic attack)     2010  . Stroke     TIA - 2010 - no deficits   . Shortness of breath     with exertion   . GERD (gastroesophageal reflux disease)   . Headache(784.0)   . Hiatal hernia 1982  . Degenerative joint disease    Past Surgical History  Procedure Laterality Date  . Gastrectomy    . Shoulder  surgery Bilateral     3 surgeries on on left, 2 surgeries on right   . Rt knee arthroscopic surgery    . Back surgery      3 cervical spine surgeries C4-C5 fused  . Hernia repair    . Finger surgery Left     2nd, 3rd, & 4th fingers were cut off by table saw and reattached  . Colonoscopy N/A 01/04/2014    Procedure: COLONOSCOPY;  Surgeon: Danie Binder, MD;  Location: [facility];  Service: Endoscopy;  Laterality: N/A;  1:45  . Esophagogastroduodenoscopy N/A 01/04/2014    Procedure: ESOPHAGOGASTRODUODENOSCOPY (EGD);  Surgeon: Danie Binder, MD;  Location: [facility];  Service: Endoscopy;  Laterality: N/A;  . Incisional hernia repair N/A 01/20/2014    Procedure: LAPAROSCOPIC RECURRENT  INCISIONAL HERNIA with mesh;  Surgeon: Edward Jolly, MD;  Location: [facility];  Service: General;  Laterality: N/A;   Social History:  reports that he has been smoking Cigarettes.  He started smoking about 48 years ago. He has a 45 pack-year smoking history. He has never used smokeless tobacco. He reports that he drinks about 3.6 oz of alcohol per week. He reports that he does not use illicit drugs. Where does patient live shelter. Can patient participate in ADLs? Yes.  Allergies  Allergen Reactions  . Bee Venom Anaphylaxis  . Penicillins Rash    Family History:  Family History  Problem Relation Age of Onset  . Cancer  Father     bone  . Cancer Brother     lungs  . Stroke Maternal Grandmother   . Colon cancer Neg Hx   . Asthma Son     died at age 78 in his sleep   . Spina bifida Son     died at age 54       Prior to Admission medications   Medication Sig Start Date End Date Taking? Authorizing Provider  albuterol (PROVENTIL HFA;VENTOLIN HFA) 108 (90 BASE) MCG/ACT inhaler Inhale 2 puffs into the lungs every 6 (six) hours as needed for wheezing or shortness of breath. 03/12/14   Encarnacion Slates, NP  albuterol-ipratropium (COMBIVENT) 18-103 MCG/ACT inhaler Inhale 2 puffs into the lungs 2 (two)  times daily. For shortness of breath 03/12/14   Encarnacion Slates, NP  ARIPiprazole (ABILIFY) 5 MG tablet Take 1 tablet (5 mg total) by mouth daily. For mood control 03/12/14   Encarnacion Slates, NP  FLUoxetine (PROZAC) 20 MG capsule Take 1 capsule (20 mg total) by mouth 3 (three) times daily. For depression 03/12/14   Encarnacion Slates, NP  gabapentin (NEURONTIN) 300 MG capsule Take 1 capsule (300 mg total) by mouth 2 (two) times daily. For substance withdrawal syndrome 03/12/14   Encarnacion Slates, NP  metoprolol tartrate (LOPRESSOR) 25 MG tablet Take 1 tablet (25 mg total) by mouth 2 (two) times daily. For high blood pressure 03/12/14   Encarnacion Slates, NP  naproxen (NAPROSYN) 500 MG tablet Take 1 tablet (500 mg total) by mouth 2 (two) times daily with a meal. For knee pain 03/12/14   Encarnacion Slates, NP  traZODone (DESYREL) 100 MG tablet Take 2 tablets (200 mg total) by mouth at bedtime. For sleep 03/12/14   Encarnacion Slates, NP    Physical Exam: Filed Vitals:   07/20/14 2345 07/20/14 2349 07/21/14 0000 07/21/14 0015  BP: 162/86 169/83 170/82 151/77  Pulse: 98 112 101 92  Temp:      TempSrc:      Resp: 29  30 31   Height:      Weight:      SpO2: 90%  91% 95%     General: Moderately built and nourished.  Eyes: Anicteric no pallor.  ENT: No discharge from the ears eyes nose mouth.  Neck: No mass felt there are no neck rigidity.  Cardiovascular: S1-S2 heard.  Respiratory: No rhonchi or crepitations.  Abdomen: Soft nontender bowel sounds present.  Skin: No rash.  Musculoskeletal: No edema.  Psychiatric: Appears normal.  Neurologic: Alert awake oriented to time place and person. Moves all extremities.  Labs on Admission:  Basic Metabolic Panel:  Recent Labs Lab 07/20/14 1750  NA 128*  K 4.3  CL 91*  CO2 25  GLUCOSE 86  BUN <5*  CREATININE 0.62  CALCIUM 8.1*   Liver Function Tests:  Recent Labs Lab 07/20/14 2326  AST 23  ALT 13  ALKPHOS 97  BILITOT 0.3  PROT 6.1  ALBUMIN 2.7*    No results for input(s): LIPASE, AMYLASE in the last 168 hours. No results for input(s): AMMONIA in the last 168 hours. CBC:  Recent Labs Lab 07/20/14 1750  WBC 13.5*  HGB 10.6*  HCT 31.4*  MCV 87.2  PLT 482*   Cardiac Enzymes:  Recent Labs Lab 07/20/14 1750 07/20/14 2046  TROPONINI <0.03 <0.03    BNP (last 3 results)  Recent Labs  04/14/14 1911  PROBNP 49.4   CBG: No results for  input(s): GLUCAP in the last 168 hours.  Radiological Exams on Admission: Dg Chest 2 View  07/20/2014   CLINICAL DATA:  Initial evaluation for fever.  History of COPD.  EXAM: CHEST  2 VIEW  COMPARISON:  Prior radiograph from 04/14/2014  FINDINGS: Cardiac and mediastinal silhouettes are stable in size and contour, and remain within normal limits.  Lungs are normally inflated. There is diffuse peribronchial thickening, suspicious for possible bronchiolitis. No consolidative airspace disease. No pulmonary edema or pleural effusion. No pneumothorax.  Surgical clips overlie the GE junction. No acute osseus abnormality.  IMPRESSION: Prominent diffuse peribronchial thickening, suspicious for bronchiolitis in the setting of fever. No consolidative airspace disease to suggest pneumonia.   Electronically Signed   By: Jeannine Boga M.D.   On: 07/20/2014 23:55    EKG: Independently reviewed. Sinus tachycardia.  Assessment/Plan Principal Problem:   Overdose Active Problems:   COPD (chronic obstructive pulmonary disease)   Alcohol dependence   Suicidal ideations   Drug overdose   1. Intentional drug overdose with suicidal ideation and depression - patient has been placed on suicide precautions with sitter. At this time as recommended by poison control patient has been placed on supportive care. Closely observe. Continue with gentle hydration. Will need psychiatric consult in a.m. 2. Fever - may be related to drugs taken by patient or since patient also had vomiting could have aspirated. Chest  x-ray shows bronchiolitis features. At this time I have place patient empirically on Levaquin for now and blood cultures were ordered. Closely observe. 3. Alcohol dependence - patient has potential for withdrawing. Patient is already placed on IV Ativan alcohol withdrawal protocol with thiamine. 4. Tobacco abuse - patient advised to quit tobacco use. 5. COPD - is not wheezing continue home medication. 6. Mild hyponatremia - gently hydrate and recheck metabolic panel.    Code Status: Full code.  Family Communication: None.  Disposition Plan: Admit to inpatient.    Drusilla Wampole N. [facility] Pager [phone].  If 7PM-7AM, please contact night-coverage [email or web address] Password [email or web address] 07/21/2014, 1:09 AM

## 2014-07-21 NOTE — Progress Notes (Signed)
CSW (Clinical Education officer, museum) made aware of pt. CSW awaiting completion of psychiatry note to make appropriate referrals.  [place], [place]

## 2014-07-21 NOTE — Progress Notes (Signed)
Patient ID: Samuel Reyes  male  QQP:[id number]    DOB: 1956/04/11    DOA: 07/20/2014  PCP: Lorayne Marek, MD  Brief history of present illness  58 year old male with major depression, COPD, ongoing alcohol and tobacco abuse, presented with drug overdose. Patient noted suicidal ideation, took 4 or 5 tablets of Abilify and trazodone with intention of hurting himself. Patient also took vodka, along with the tablets. Patient is admitted to stepdown unit for observation.    Assessment/Plan: Principal Problem: Intentional drug  Overdose - Continue suicide precautions, sitter - Continue supportive care, repeat LFTs and EKG - Psych consultation called this morning  Active Problems: Hyponatremia - Possibly from trazodone OD causing SIADH, for now continue IV fluids, repeat BMET in a.m., stable since yesterday    COPD (chronic obstructive pulmonary disease) with mild acute bronchitis -Currently stable, no wheezing - continue IV levofloxacin, duo nebs    Alcohol dependence Continue Ativan with CIWA protocol     major depression - Psych consult called  DVT Prophylaxis: Lovenox   Code Status: full code   Family Communication:  Disposition:   Consultants: Psychiatry  Procedures  None  Antibiotics :  IV levofloxacin   Subjective : Patient seen and examined, denies any specific complaints at this time, no chest pain or shortness of breath, nausea or vomiting   Objective : Weight change:   Intake/Output Summary (Last 24 hours) at 07/21/14 1155 Last data filed at 07/21/14 1100  Gross per 24 hour  Intake    600 ml  Output      0 ml  Net    600 ml   Blood pressure 155/79, pulse 75, temperature 98.3 F (36.8 C), temperature source Oral, resp. rate 23, height 5\' 5"  (1.651 m), weight 68.947 kg (152 lb), SpO2 90 %.  Physical Exam: General: Alert and awake, oriented x3, not in any acute distress. HEENT: anicteric sclera, PERLA, EOMI CVS: S1-S2 clear, no murmur rubs  or gallops Chest: clear to auscultation bilaterally, no wheezing, rales or rhonchi Abdomen: soft nontender, nondistended, normal bowel sounds  Extremities: no cyanosis, clubbing or edema noted bilaterally Neuro: Cranial nerves II-XII intact, no focal neurological deficits  Lab Results: Basic Metabolic Panel:  Recent Labs Lab 07/20/14 1750 07/21/14 0656  NA 128* 128*  K 4.3 3.9  CL 91* 90*  CO2 25 27  GLUCOSE 86 90  BUN <5* 7  CREATININE 0.62 0.83  CALCIUM 8.1* 8.0*   Liver Function Tests:  Recent Labs Lab 07/20/14 2326 07/21/14 0656  AST 23 16  ALT 13 12  ALKPHOS 97 92  BILITOT 0.3 0.6  PROT 6.1 5.7*  ALBUMIN 2.7* 2.4*   No results for input(s): LIPASE, AMYLASE in the last 168 hours. No results for input(s): AMMONIA in the last 168 hours. CBC:  Recent Labs Lab 07/20/14 1750 07/21/14 0656  WBC 13.5* 10.7*  NEUTROABS  --  7.9*  HGB 10.6* 10.2*  HCT 31.4* 31.3*  MCV 87.2 88.7  PLT 482* 466*   Cardiac Enzymes:  Recent Labs Lab 07/20/14 1750 07/20/14 2046 07/21/14 0656  TROPONINI <0.03 <0.03 <0.03   BNP: Invalid input(s): POCBNP CBG: No results for input(s): GLUCAP in the last 168 hours.   Micro Results: Recent Results (from the past 240 hour(s))  MRSA PCR Screening     Status: None   Collection Time: 07/21/14  5:14 AM  Result Value Ref Range Status   MRSA by PCR NEGATIVE NEGATIVE Final    Comment:  The GeneXpert MRSA Assay (FDA approved for NASAL specimens only), is one component of a comprehensive MRSA colonization surveillance program. It is not intended to diagnose MRSA infection nor to guide or monitor treatment for MRSA infections.     Studies/Results: Dg Chest 2 View  07/20/2014   CLINICAL DATA:  Initial evaluation for fever.  History of COPD.  EXAM: CHEST  2 VIEW  COMPARISON:  Prior radiograph from 04/14/2014  FINDINGS: Cardiac and mediastinal silhouettes are stable in size and contour, and remain within normal limits.   Lungs are normally inflated. There is diffuse peribronchial thickening, suspicious for possible bronchiolitis. No consolidative airspace disease. No pulmonary edema or pleural effusion. No pneumothorax.  Surgical clips overlie the GE junction. No acute osseus abnormality.  IMPRESSION: Prominent diffuse peribronchial thickening, suspicious for bronchiolitis in the setting of fever. No consolidative airspace disease to suggest pneumonia.   Electronically Signed   By: Jeannine Boga M.D.   On: 07/20/2014 23:55    Medications: Scheduled Meds: . enoxaparin (LOVENOX) injection  40 mg Subcutaneous Q24H  . folic acid  1 mg Oral Daily  . [START ON 07/22/2014] Influenza vac split quadrivalent PF  0.5 mL Intramuscular Tomorrow-1000  . ipratropium-albuterol  3 mL Nebulization BID  . levofloxacin (LEVAQUIN) IV  500 mg Intravenous Q24H  . LORazepam  0-4 mg Intravenous Q6H   Followed by  . [START ON 07/23/2014] LORazepam  0-4 mg Intravenous Q12H  . multivitamin with minerals  1 tablet Oral Daily  . [START ON 07/22/2014] pneumococcal 23 valent vaccine  0.5 mL Intramuscular Tomorrow-1000  . thiamine  100 mg Oral Daily   Or  . thiamine  100 mg Intravenous Daily      LOS: 1 day   Antionio Negron M.D. [facility] 07/21/2014, 11:55 AM Pager: [phone]  If 7PM-7AM, please contact night-coverage [email or web address] Password [email or web address]

## 2014-07-21 NOTE — ED Notes (Signed)
Poison control updated 

## 2014-07-21 NOTE — ED Notes (Signed)
Pt with sitter at bedside, denies needs

## 2014-07-21 NOTE — ED Notes (Signed)
Belongings given to RN on [facility]

## 2014-07-21 NOTE — Progress Notes (Signed)
Nutrition Brief Note  Patient identified on the Malnutrition Screening Tool (MST) Report  Wt Readings from Last 15 Encounters:  07/21/14 152 lb (68.947 kg)  04/15/14 150 lb (68.04 kg)  03/09/14 144 lb (65.318 kg)  02/24/14 154 lb 6.4 oz (70.035 kg)  01/21/14 160 lb (72.576 kg)  01/19/14 161 lb (73.029 kg)  01/04/14 155 lb (70.308 kg)  12/29/13 156 lb 12.8 oz (71.124 kg)  12/23/13 156 lb 6.4 oz (70.943 kg)  11/26/13 162 lb 9.6 oz (73.755 kg)  11/23/13 161 lb (73.029 kg)  10/28/13 161 lb 12.8 oz (73.392 kg)  06/15/13 162 lb (73.483 kg)  03/23/13 146 lb (66.225 kg)  03/09/13 143 lb (64.63 kg)   58 year old male with major depression, COPD, ongoing alcohol and tobacco abuse, presented with drug overdose. Patient noted suicidal ideation, took 4 or 5 tablets of Abilify and trazodone with intention of hurting himself. Patient also took vodka, along with the tablets. Patient is admitted to stepdown unit for observation.  Pt asleep at time of visit. Noted weight fluctuations over the past year. Wt generally fluctuates between 150-160#. Awaiting psychiatry consult. Pt consumed approximately 75% of lunch tray.   Body mass index is 25.29 kg/(m^2). Patient meets criteria for overweight based on current BMI.   Current diet order is regular, patient is consuming approximately 80% of meals at this time. Labs and medications reviewed.   No nutrition interventions warranted at this time. If nutrition issues arise, please consult RD.   Mackenzy Grumbine A. Jimmye Norman, RD, LDN Pager: [phone] After hours Pager: [phone]

## 2014-07-22 DIAGNOSIS — F10230 Alcohol dependence with withdrawal, uncomplicated: Secondary | ICD-10-CM

## 2014-07-22 DIAGNOSIS — T43212A Poisoning by selective serotonin and norepinephrine reuptake inhibitors, intentional self-harm, initial encounter: Secondary | ICD-10-CM

## 2014-07-22 DIAGNOSIS — T43592A Poisoning by other antipsychotics and neuroleptics, intentional self-harm, initial encounter: Principal | ICD-10-CM

## 2014-07-22 LAB — COMPREHENSIVE METABOLIC PANEL WITH GFR
ALT: 10 U/L (ref 0–53)
AST: 13 U/L (ref 0–37)
Albumin: 2.1 g/dL — ABNORMAL LOW (ref 3.5–5.2)
Alkaline Phosphatase: 80 U/L (ref 39–117)
Anion gap: 8 (ref 5–15)
BUN: <5 mg/dL — ABNORMAL LOW (ref 6–23)
CO2: 30 mmol/L (ref 19–32)
Calcium: 8 mg/dL — ABNORMAL LOW (ref 8.4–10.5)
Chloride: 97 meq/L (ref 96–112)
Creatinine, Ser: 0.71 mg/dL (ref 0.50–1.35)
GFR calc Af Amer: >90 mL/min
GFR calc non Af Amer: >90 mL/min
Glucose, Bld: 104 mg/dL — ABNORMAL HIGH (ref 70–99)
Potassium: 3.9 mmol/L (ref 3.5–5.1)
Sodium: 135 mmol/L (ref 135–145)
Total Bilirubin: 0.2 mg/dL — ABNORMAL LOW (ref 0.3–1.2)
Total Protein: 5.3 g/dL — ABNORMAL LOW (ref 6.0–8.3)

## 2014-07-22 LAB — GLUCOSE, CAPILLARY: Glucose-Capillary: 153 mg/dL — ABNORMAL HIGH (ref 70–99)

## 2014-07-22 MED ORDER — GI COCKTAIL ~~LOC~~
30.0000 mL | Freq: Three times a day (TID) | ORAL | Status: DC | PRN
  Filled 2014-07-22: qty 30

## 2014-07-22 MED ORDER — LOSARTAN POTASSIUM 50 MG PO TABS: 50.0000 mg | ORAL_TABLET | Freq: Every day | ORAL | Status: DC

## 2014-07-22 MED ORDER — METOPROLOL TARTRATE 25 MG PO TABS
25.0000 mg | ORAL_TABLET | Freq: Two times a day (BID) | ORAL | Status: DC
  Administered 2014-07-22 (×2): 25 mg via ORAL
  Filled 2014-07-22 (×4): qty 1

## 2014-07-22 MED ORDER — PANTOPRAZOLE SODIUM 40 MG PO TBEC
40.0000 mg | DELAYED_RELEASE_TABLET | Freq: Two times a day (BID) | ORAL | Status: DC
  Administered 2014-07-22 – 2014-07-23 (×3): 40 mg via ORAL
  Filled 2014-07-22 (×3): qty 1

## 2014-07-22 MED ORDER — NICOTINE 21 MG/24HR TD PT24
21.0000 mg | MEDICATED_PATCH | Freq: Every day | TRANSDERMAL | Status: DC
  Administered 2014-07-22 – 2014-07-27 (×6): 21 mg via TRANSDERMAL
  Filled 2014-07-22 (×6): qty 1

## 2014-07-22 MED ORDER — LEVOFLOXACIN 500 MG PO TABS
500.0000 mg | ORAL_TABLET | Freq: Every day | ORAL | Status: DC
  Administered 2014-07-23: 500 mg via ORAL
  Filled 2014-07-22: qty 1

## 2014-07-22 NOTE — Progress Notes (Signed)
Pt transferred to 5W24. Pt is alert and oriented x 4, able to follow commands and is able to contract for safety. Room search conducted, sitter for safety bedside. Report received from [facility]. Will continue to monitor closely.

## 2014-07-22 NOTE — Progress Notes (Signed)
Patient has been restless and agitated this evening, scored a 14 on CIWA, medication given.  Patient wanting to leave AMA.  Patient stated, "he signed himself in and he can sign himself out."  Security called and hospitalist on call arrived to floor and in the process of filling out IVC papers.  Will continue to monitor.  Wilson Singer, RN

## 2014-07-22 NOTE — Progress Notes (Signed)
CSW (Clinical Education officer, museum) faxed referral to [facility], [place], and Insurance underwriter.  [place], [place]

## 2014-07-22 NOTE — Progress Notes (Signed)
Patient ID: Samuel Reyes  male  KTG:[id number]    DOB: 1955-09-20    DOA: 07/20/2014  PCP: Lorayne Marek, MD  Brief history of present illness  58 year old male with major depression, COPD, ongoing alcohol and tobacco abuse, presented with drug overdose. Patient noted suicidal ideation, took 4 or 5 tablets of Abilify and trazodone with intention of hurting himself. Patient also took vodka, along with the tablets. Patient was admitted to stepdown unit for observation.  Assessment/Plan: Principal Problem: Intentional drug  Overdose - Continue suicide precautions, sitter - Continue supportive care, LFTs within normal limits - Psych consultation again called this morning - Follow EKG closely, and QTc slightly better to 474 (from 496 on admission)  Active Problems: Hyponatremia - improved, Possibly from trazodone OD causing SIADH, for now continue IV fluids    COPD (chronic obstructive pulmonary disease) with mild acute bronchitis -Currently stable, no wheezing - continue IV levofloxacin, duo nebs    Alcohol dependence Continue Ativan with CIWA protocol     major depression - Psych consult called, still pending  GERD: Placed on PPI and GI cocktail as needed  DVT Prophylaxis: Lovenox   Code Status: full code   Family Communication:  Disposition: Patient medically stable and cleared for discharge to psych, transferred to telemetry floor today  Consultants: Psychiatry  Procedures  None  Antibiotics :  IV levofloxacin   Subjective : Patient seen and examined, complaining of indigestion and GERD. No fevers or chills or any shortness of breath, nausea vomiting   Objective : Weight change:   Intake/Output Summary (Last 24 hours) at 07/22/14 1143 Last data filed at 07/22/14 0843  Gross per 24 hour  Intake   2740 ml  Output    800 ml  Net   1940 ml   Blood pressure 134/72, pulse 82, temperature 98.5 F (36.9 C), temperature source Oral, resp. rate 16, height  5\' 5"  (1.651 m), weight 68.947 kg (152 lb), SpO2 91 %.  Physical Exam: General: Alert and awake, oriented x3, not in any acute distress. CVS: S1-S2 clear, no murmur rubs or gallops Chest: clear to auscultation bilaterally, no wheezing, rales or rhonchi Abdomen: soft nontender, nondistended, normal bowel sounds  Extremities: no cyanosis, clubbing or edema noted bilaterally Neuro: Cranial nerves II-XII intact, no focal neurological deficits  Lab Results: Basic Metabolic Panel:  Recent Labs Lab 07/21/14 0656 07/22/14 0318  NA 128* 135  K 3.9 3.9  CL 90* 97  CO2 27 30  GLUCOSE 90 104*  BUN 7 <5*  CREATININE 0.83 0.71  CALCIUM 8.0* 8.0*   Liver Function Tests:  Recent Labs Lab 07/21/14 0656 07/22/14 0318  AST 16 13  ALT 12 10  ALKPHOS 92 80  BILITOT 0.6 0.2*  PROT 5.7* 5.3*  ALBUMIN 2.4* 2.1*   No results for input(s): LIPASE, AMYLASE in the last 168 hours. No results for input(s): AMMONIA in the last 168 hours. CBC:  Recent Labs Lab 07/20/14 1750 07/21/14 0656  WBC 13.5* 10.7*  NEUTROABS  --  7.9*  HGB 10.6* 10.2*  HCT 31.4* 31.3*  MCV 87.2 88.7  PLT 482* 466*   Cardiac Enzymes:  Recent Labs Lab 07/20/14 1750 07/20/14 2046 07/21/14 0656  TROPONINI <0.03 <0.03 <0.03   BNP: Invalid input(s): POCBNP CBG: No results for input(s): GLUCAP in the last 168 hours.   Micro Results: Recent Results (from the past 240 hour(s))  Blood culture (routine x 2)     Status: None (Preliminary result)   Collection  Time: 07/21/14 12:30 AM  Result Value Ref Range Status   Specimen Description BLOOD RIGHT ARM  Final   Special Requests BOTTLES DRAWN AEROBIC AND ANAEROBIC 10CC  Final   Culture  Setup Time   Final    07/21/2014 11:17 Performed at [facility]    Culture   Final           BLOOD CULTURE RECEIVED NO GROWTH TO DATE CULTURE WILL BE HELD FOR 5 DAYS BEFORE ISSUING A FINAL NEGATIVE REPORT Performed at [facility]    Report Status PENDING   Incomplete  Blood culture (routine x 2)     Status: None (Preliminary result)   Collection Time: 07/21/14 12:40 AM  Result Value Ref Range Status   Specimen Description BLOOD RIGHT HAND  Final   Special Requests BOTTLES DRAWN AEROBIC ONLY 10CC  Final   Culture  Setup Time   Final    07/21/2014 11:17 Performed at [facility]    Culture   Final           BLOOD CULTURE RECEIVED NO GROWTH TO DATE CULTURE WILL BE HELD FOR 5 DAYS BEFORE ISSUING A FINAL NEGATIVE REPORT Performed at [facility]    Report Status PENDING  Incomplete  MRSA PCR Screening     Status: None   Collection Time: 07/21/14  5:14 AM  Result Value Ref Range Status   MRSA by PCR NEGATIVE NEGATIVE Final    Comment:        The GeneXpert MRSA Assay (FDA approved for NASAL specimens only), is one component of a comprehensive MRSA colonization surveillance program. It is not intended to diagnose MRSA infection nor to guide or monitor treatment for MRSA infections.     Studies/Results: Dg Chest 2 View  07/20/2014   CLINICAL DATA:  Initial evaluation for fever.  History of COPD.  EXAM: CHEST  2 VIEW  COMPARISON:  Prior radiograph from 04/14/2014  FINDINGS: Cardiac and mediastinal silhouettes are stable in size and contour, and remain within normal limits.  Lungs are normally inflated. There is diffuse peribronchial thickening, suspicious for possible bronchiolitis. No consolidative airspace disease. No pulmonary edema or pleural effusion. No pneumothorax.  Surgical clips overlie the GE junction. No acute osseus abnormality.  IMPRESSION: Prominent diffuse peribronchial thickening, suspicious for bronchiolitis in the setting of fever. No consolidative airspace disease to suggest pneumonia.   Electronically Signed   By: Jeannine Boga M.D.   On: 07/20/2014 23:55    Medications: Scheduled Meds: . enoxaparin (LOVENOX) injection  40 mg Subcutaneous Q24H  . folic acid  1 mg Oral Daily  . Influenza vac  split quadrivalent PF  0.5 mL Intramuscular Tomorrow-1000  . ipratropium-albuterol  3 mL Nebulization BID  . levofloxacin (LEVAQUIN) IV  500 mg Intravenous Q24H  . LORazepam  0-4 mg Intravenous Q6H   Followed by  . [START ON 07/23/2014] LORazepam  0-4 mg Intravenous Q12H  . metoprolol tartrate  25 mg Oral BID  . multivitamin with minerals  1 tablet Oral Daily  . pantoprazole  40 mg Oral BID AC  . pneumococcal 23 valent vaccine  0.5 mL Intramuscular Tomorrow-1000  . thiamine  100 mg Oral Daily   Or  . thiamine  100 mg Intravenous Daily      LOS: 2 days   Noralyn Karim M.D. [facility] 07/22/2014, 11:43 AM Pager: [phone]  If 7PM-7AM, please contact night-coverage [email or web address] Password [email or web address]

## 2014-07-23 DIAGNOSIS — F10231 Alcohol dependence with withdrawal delirium: Secondary | ICD-10-CM

## 2014-07-23 DIAGNOSIS — R45851 Suicidal ideations: Secondary | ICD-10-CM

## 2014-07-23 DIAGNOSIS — T50902A Poisoning by unspecified drugs, medicaments and biological substances, intentional self-harm, initial encounter: Secondary | ICD-10-CM

## 2014-07-23 DIAGNOSIS — J410 Simple chronic bronchitis: Secondary | ICD-10-CM

## 2014-07-23 LAB — GLUCOSE, CAPILLARY: Glucose-Capillary: 95 mg/dL (ref 70–99)

## 2014-07-23 MED ORDER — LORAZEPAM 2 MG/ML IJ SOLN
0.0000 mg | Freq: Two times a day (BID) | INTRAMUSCULAR | Status: DC
Start: 2014-07-25 — End: 2014-07-23

## 2014-07-23 MED ORDER — FOLIC ACID 1 MG PO TABS
1.0000 mg | ORAL_TABLET | Freq: Every day | ORAL | Status: DC
  Administered 2014-07-23 – 2014-07-27 (×5): 1 mg via ORAL
  Filled 2014-07-23 (×5): qty 1

## 2014-07-23 MED ORDER — DIAZEPAM 5 MG/ML IJ SOLN
5.0000 mg | Freq: Once | INTRAMUSCULAR | Status: AC
  Administered 2014-07-23: 5 mg via INTRAVENOUS
  Filled 2014-07-23: qty 2

## 2014-07-23 MED ORDER — LORAZEPAM 2 MG/ML IJ SOLN: 2.0000 mg | INTRAMUSCULAR | Status: DC | PRN

## 2014-07-23 MED ORDER — IPRATROPIUM-ALBUTEROL 0.5-2.5 (3) MG/3ML IN SOLN
3.0000 mL | RESPIRATORY_TRACT | Status: DC | PRN
  Administered 2014-07-25: 3 mL via RESPIRATORY_TRACT
  Filled 2014-07-23: qty 3

## 2014-07-23 MED ORDER — LORAZEPAM 2 MG/ML IJ SOLN: 0.0000 mg | Freq: Four times a day (QID) | INTRAMUSCULAR | Status: DC

## 2014-07-23 MED ORDER — DEXMEDETOMIDINE HCL IN NACL 200 MCG/50ML IV SOLN
0.2000 ug/kg/h | INTRAVENOUS | Status: DC
  Administered 2014-07-23: 0.2 ug/kg/h via INTRAVENOUS
  Administered 2014-07-23 (×2): 0.7 ug/kg/h via INTRAVENOUS
  Filled 2014-07-23: qty 50
  Filled 2014-07-23: qty 100

## 2014-07-23 MED ORDER — VITAMIN B-1 100 MG PO TABS
100.0000 mg | ORAL_TABLET | Freq: Every day | ORAL | Status: DC
  Administered 2014-07-23 – 2014-07-27 (×5): 100 mg via ORAL
  Filled 2014-07-23 (×5): qty 1

## 2014-07-23 MED ORDER — PANTOPRAZOLE SODIUM 40 MG IV SOLR
40.0000 mg | Freq: Two times a day (BID) | INTRAVENOUS | Status: DC
  Filled 2014-07-23 (×2): qty 40

## 2014-07-23 MED ORDER — SODIUM CHLORIDE 0.9 % IV SOLN
INTRAVENOUS | Status: DC
  Administered 2014-07-23: 11:00:00 via INTRAVENOUS

## 2014-07-23 MED ORDER — LEVOFLOXACIN IN D5W 500 MG/100ML IV SOLN
500.0000 mg | INTRAVENOUS | Status: DC
  Administered 2014-07-24: 500 mg via INTRAVENOUS
  Filled 2014-07-23 (×2): qty 100

## 2014-07-23 MED ORDER — LORAZEPAM 2 MG/ML IJ SOLN
2.0000 mg | Freq: Once | INTRAMUSCULAR | Status: AC
  Administered 2014-07-23: 2 mg via INTRAVENOUS
  Filled 2014-07-23: qty 1

## 2014-07-23 MED ORDER — DEXMEDETOMIDINE HCL IN NACL 400 MCG/100ML IV SOLN
0.2000 ug/kg/h | INTRAVENOUS | Status: DC
  Administered 2014-07-23: 0.7 ug/kg/h via INTRAVENOUS
  Administered 2014-07-24: 0.2 ug/kg/h via INTRAVENOUS
  Administered 2014-07-24: 0.7 ug/kg/h via INTRAVENOUS
  Filled 2014-07-23: qty 200
  Filled 2014-07-23: qty 100
  Filled 2014-07-23: qty 200

## 2014-07-23 MED ORDER — THIAMINE HCL 100 MG/ML IJ SOLN
100.0000 mg | Freq: Every day | INTRAMUSCULAR | Status: DC
  Filled 2014-07-23: qty 1

## 2014-07-23 MED ORDER — PANTOPRAZOLE SODIUM 40 MG PO TBEC
40.0000 mg | DELAYED_RELEASE_TABLET | Freq: Every day | ORAL | Status: DC
  Administered 2014-07-23 – 2014-07-26 (×4): 40 mg via ORAL
  Filled 2014-07-23 (×3): qty 1

## 2014-07-23 MED ORDER — LORAZEPAM 2 MG/ML IJ SOLN: 2.0000 mg | Freq: Once | INTRAMUSCULAR | Status: DC

## 2014-07-23 MED ORDER — METOPROLOL TARTRATE 1 MG/ML IV SOLN
2.5000 mg | Freq: Four times a day (QID) | INTRAVENOUS | Status: DC
  Filled 2014-07-23 (×4): qty 5

## 2014-07-23 MED ORDER — FOLIC ACID 5 MG/ML IJ SOLN
1.0000 mg | Freq: Every day | INTRAMUSCULAR | Status: DC
  Filled 2014-07-23: qty 0.2

## 2014-07-23 NOTE — Consult Note (Signed)
PULMONARY / CRITICAL CARE MEDICINE   Name: Samuel Reyes MRN: [id number] DOB: 1955/12/09    ADMISSION DATE:  07/20/2014 CONSULTATION DATE:  07/23/2014  REFERRING MD :  Dr. Tana Coast  CHIEF COMPLAINT:  Agitation  INITIAL PRESENTATION:  58 yo male smoker presented to ED with intentional drug overdose after feeling depressed.  He has hx of ETOH (drinks case of beer per day).  Developed progressive agitation and transferred to ICU 12/25 for alcohol withdrawal.  STUDIES:   SIGNIFICANT EVENTS: 12/22 admit 12/23 Psych consulted 12/25 Transfer to ICU   HISTORY OF PRESENT ILLNESS:   58 yo male was feeling depressed around holidays.  He took abilify, trazodone, alcohol with intent to harm himself.  He became more combative in AM of 12/25 and security was called.  He was given several doses of ativan and valium.  He was transferred to ICU for further management.  He denies cough, wheeze, sputum, chest pain, abdominal pain, nausea, or tremors.  PAST MEDICAL HISTORY :   has a past medical history of Cirrhosis; Degenerative joint disease; Hypertension; Gastric ulcer; Depression; Asthma; Mitral valve prolapse (2002); H/O hiatal hernia; COPD (chronic obstructive pulmonary disease); TIA (transient ischemic attack); Stroke; Shortness of breath; GERD (gastroesophageal reflux disease); Headache(784.0); Hiatal hernia (1982); and Degenerative joint disease.  has past surgical history that includes Gastrectomy; Shoulder surgery (Bilateral); rt knee arthroscopic surgery; Back surgery; Hernia repair; Finger surgery (Left); Colonoscopy (N/A, 01/04/2014); Esophagogastroduodenoscopy (N/A, 01/04/2014); and Incisional hernia repair (N/A, 01/20/2014). Prior to Admission medications   Medication Sig Start Date End Date Taking? Authorizing Provider  albuterol (PROVENTIL HFA;VENTOLIN HFA) 108 (90 BASE) MCG/ACT inhaler Inhale 2 puffs into the lungs every 6 (six) hours as needed for wheezing or shortness of breath. 03/12/14  Yes  Encarnacion Slates, NP  albuterol-ipratropium (COMBIVENT) 18-103 MCG/ACT inhaler Inhale 2 puffs into the lungs 2 (two) times daily. For shortness of breath 03/12/14  Yes Encarnacion Slates, NP  ARIPiprazole (ABILIFY) 5 MG tablet Take 1 tablet (5 mg total) by mouth daily. For mood control 03/12/14  Yes Encarnacion Slates, NP  FLUoxetine (PROZAC) 20 MG capsule Take 1 capsule (20 mg total) by mouth 3 (three) times daily. For depression Patient taking differently: Take 80 mg by mouth at bedtime. For depression 03/12/14  Yes Encarnacion Slates, NP  gabapentin (NEURONTIN) 300 MG capsule Take 1 capsule (300 mg total) by mouth 2 (two) times daily. For substance withdrawal syndrome 03/12/14  Yes Encarnacion Slates, NP  metoprolol tartrate (LOPRESSOR) 25 MG tablet Take 1 tablet (25 mg total) by mouth 2 (two) times daily. For high blood pressure 03/12/14  Yes Encarnacion Slates, NP  naproxen (NAPROSYN) 500 MG tablet Take 1 tablet (500 mg total) by mouth 2 (two) times daily with a meal. For knee pain 03/12/14  Yes Encarnacion Slates, NP  nicotine (NICODERM CQ - DOSED IN MG/24 HOURS) 21 mg/24hr patch Place 21 mg onto the skin daily.   Yes Historical Provider, MD  omeprazole (PRILOSEC) 20 MG capsule Take 20 mg by mouth daily. 05/29/14  Yes Historical Provider, MD  OVER THE COUNTER MEDICATION Take 2 tablets by mouth at bedtime as needed (cold symptoms).   Yes Historical Provider, MD  traZODone (DESYREL) 100 MG tablet Take 2 tablets (200 mg total) by mouth at bedtime. For sleep Patient taking differently: Take 250 mg by mouth at bedtime. For sleep 03/12/14  Yes Encarnacion Slates, NP   Allergies  Allergen Reactions  . Bee Venom Anaphylaxis  .  Penicillins Rash    FAMILY HISTORY:  indicated that his mother is alive. He indicated that his father is deceased. He indicated that his brother is deceased. He indicated that his maternal grandmother is deceased. He indicated that his daughter is alive. He indicated that both of his sons are deceased.  SOCIAL  HISTORY:  reports that he has been smoking Cigarettes.  He started smoking about 48 years ago. He has a 45 pack-year smoking history. He has never used smokeless tobacco. He reports that he drinks about 3.6 oz of alcohol per week. He reports that he does not use illicit drugs.  REVIEW OF SYSTEMS:   Negative except above  SUBJECTIVE:   VITAL SIGNS: Temp:  [98.5 F (36.9 C)-99.6 F (37.6 C)] 98.9 F (37.2 C) (12/25 0500) Pulse Rate:  [76-102] 93 (12/25 0942) Resp:  [16-18] 18 (12/25 0500) BP: (131-166)/(72-117) 131/75 mmHg (12/25 0942) SpO2:  [91 %-96 %] 95 % (12/25 0737) INTAKE / OUTPUT:  Intake/Output Summary (Last 24 hours) at 07/23/14 1022 Last data filed at 07/23/14 0200  Gross per 24 hour  Intake   1920 ml  Output      0 ml  Net   1920 ml    PHYSICAL EXAMINATION: General: no distress Neuro: alert, follows commands, normal strength, CN intact HEENT: pupils reactive, no sinus tenderness Cardiovascular: regular, no murmur Lungs: no wheeze Abdomen: soft, non tender Musculoskeletal: no edema Skin: no rashes  LABS:  CBC  Recent Labs Lab 07/20/14 1750 07/21/14 0656  WBC 13.5* 10.7*  HGB 10.6* 10.2*  HCT 31.4* 31.3*  PLT 482* 466*   BMET  Recent Labs Lab 07/20/14 1750 07/21/14 0656 07/22/14 0318  NA 128* 128* 135  K 4.3 3.9 3.9  CL 91* 90* 97  CO2 25 27 30   BUN <5* 7 <5*  CREATININE 0.62 0.83 0.71  GLUCOSE 86 90 104*   Electrolytes  Recent Labs Lab 07/20/14 1750 07/21/14 0656 07/22/14 0318  CALCIUM 8.1* 8.0* 8.0*   Liver Enzymes  Recent Labs Lab 07/20/14 2326 07/21/14 0656 07/22/14 0318  AST 23 16 13   ALT 13 12 10   ALKPHOS 97 92 80  BILITOT 0.3 0.6 0.2*  ALBUMIN 2.7* 2.4* 2.1*   Cardiac Enzymes  Recent Labs Lab 07/20/14 1750 07/20/14 2046 07/21/14 0656  TROPONINI <0.03 <0.03 <0.03   Glucose  Recent Labs Lab 07/22/14 2349 07/23/14 1015  GLUCAP 153* 95    Imaging No results found.   ASSESSMENT /  PLAN: NEUROLOGIC A:   Alcohol withdrawal. Intentional drug overdose with suicidal ideation. Hx of depression. P:   Thiamine, folic acid, MVI Add precedex 12/25 Sitter at bedside F/u with psych  PULMONARY A: Hx of COPD. Tobacco abuse. P:   PRN BD's Bronchial hygiene Oxygen prn F/u CXR as needed Nicotine patch  CARDIOVASCULAR A:  Hx of HTN. P:  D/c lopressor >> re-assess needs after starting precedex  RENAL A:   Hyponatremia likely from ETOH abuse >> improved. P:   Monitor renal fx, urine outpt, electrolytes  GASTROINTESTINAL A:   Alcoholic cirrhosis. Nutrition. P:   Regular diet Protonix for SUP  HEMATOLOGIC A:   Anemia of chronic disease. P:  F/u CBC  INFECTIOUS A:   AECOPD. P:   D/c Abx after dose on 12/25  Blood cx 12/23 >>   ENDOCRINE A:   No acute issues. P:   Monitor blood sugar on BMET  FAMILY  - Updates:   - Inter-disciplinary family meet or Palliative Care meeting  due by 07/30/14:     CC time 40 minutes.  Chesley Mires, MD [facility] Pulmonary/Critical Care 07/23/2014, 10:36 AM Pager:  [phone] After 3pm call: [phone]

## 2014-07-23 NOTE — Progress Notes (Signed)
IVC papers have been completed and in patient's chart.  Will continue to monitor.  Wilson Singer, RN

## 2014-07-23 NOTE — Progress Notes (Signed)
Patient ID: Samuel Reyes  male  JSE:[id number]    DOB: 1956/06/17    DOA: 07/20/2014  PCP: Lorayne Marek, MD  Brief history of present illness  58 year old male with major depression, COPD, ongoing alcohol and tobacco abuse, presented with drug overdose. Patient noted suicidal ideation, took 4 or 5 tablets of Abilify and trazodone with intention of hurting himself. Patient also took vodka, along with the tablets. Patient was admitted to stepdown unit for observation, he was transferred to telemetry floor on 12/24.  Assessment/Plan: Principal Problem: Acute alcohol withdrawals: drinks a case of beer every day, was intoxicated at the time of admission, EtOH level 151. Patient having active delirium tremens and hallucinating, asking to leave now to walk for 2 hours.      - Patient agitated since last night, security was called and patient was involuntary committed. This morning,  CIWA's ranging 19-24, gave 7mg  ativan since 5:15am, Valium 5 mg x1, called security again, still pacing in the room.  - Requested CCM consult, patient to be transferred to ICU for Precedex drip    - Continue IV thiamine, folate  Intentional drug  Overdose - Continue suicide precautions, sitter - Continue supportive care, LFTs within normal limits - Psych consulted - Follow EKG closely, and QTc slightly better to 474 (from 496 on admission)  Hyponatremia - improved, Possibly from trazodone OD causing SIADH, for now continue IV fluids    COPD (chronic obstructive pulmonary disease) with mild acute bronchitis -Currently stable, no wheezing - continue IV levofloxacin, duo nebs   major depression - Psych consulted  GERD: Placed on PPI IV  DVT Prophylaxis: Lovenox   Code Status: full code   Family Communication:  Disposition: transfer to ICU for acute alcohol withdrawals with DT's, hallucinations   Consultants: Psychiatry CCM  Procedures  None  Antibiotics :  IV levofloxacin   Subjective  : Patient seen and examined, pacing in the room, agitated, security called   Objective : Weight change:   Intake/Output Summary (Last 24 hours) at 07/23/14 1049 Last data filed at 07/23/14 0200  Gross per 24 hour  Intake   1920 ml  Output      0 ml  Net   1920 ml   Blood pressure 135/82, pulse 93, temperature 98.5 F (36.9 C), temperature source Oral, resp. rate 18, height 5\' 5"  (1.651 m), weight 70.9 kg (156 lb 4.9 oz), SpO2 95 %.  Physical Exam: General: Alert and awakevery confused, agitated CVS: Tachycardia, S1 and S2 clear Chest:  CTAB Abdomen: soft nontender, nondistended, normal bowel sounds  Extremities: no cyanosis, clubbing or edema noted bilaterally Neuro: pacing in the room  Lab Results: Basic Metabolic Panel:  Recent Labs Lab 07/21/14 0656 07/22/14 0318  NA 128* 135  K 3.9 3.9  CL 90* 97  CO2 27 30  GLUCOSE 90 104*  BUN 7 <5*  CREATININE 0.83 0.71  CALCIUM 8.0* 8.0*   Liver Function Tests:  Recent Labs Lab 07/21/14 0656 07/22/14 0318  AST 16 13  ALT 12 10  ALKPHOS 92 80  BILITOT 0.6 0.2*  PROT 5.7* 5.3*  ALBUMIN 2.4* 2.1*   No results for input(s): LIPASE, AMYLASE in the last 168 hours. No results for input(s): AMMONIA in the last 168 hours. CBC:  Recent Labs Lab 07/20/14 1750 07/21/14 0656  WBC 13.5* 10.7*  NEUTROABS  --  7.9*  HGB 10.6* 10.2*  HCT 31.4* 31.3*  MCV 87.2 88.7  PLT 482* 466*   Cardiac Enzymes:  Recent Labs Lab 07/20/14 1750 07/20/14 2046 07/21/14 0656  TROPONINI <0.03 <0.03 <0.03   BNP: Invalid input(s): POCBNP CBG:  Recent Labs Lab 07/22/14 2349 07/23/14 1015  GLUCAP 153* 95     Micro Results: Recent Results (from the past 240 hour(s))  Blood culture (routine x 2)     Status: None (Preliminary result)   Collection Time: 07/21/14 12:30 AM  Result Value Ref Range Status   Specimen Description BLOOD RIGHT ARM  Final   Special Requests BOTTLES DRAWN AEROBIC AND ANAEROBIC 10CC  Final   Culture   Setup Time   Final    07/21/2014 11:17 Performed at [facility]    Culture   Final           BLOOD CULTURE RECEIVED NO GROWTH TO DATE CULTURE WILL BE HELD FOR 5 DAYS BEFORE ISSUING A FINAL NEGATIVE REPORT Performed at [facility]    Report Status PENDING  Incomplete  Blood culture (routine x 2)     Status: None (Preliminary result)   Collection Time: 07/21/14 12:40 AM  Result Value Ref Range Status   Specimen Description BLOOD RIGHT HAND  Final   Special Requests BOTTLES DRAWN AEROBIC ONLY 10CC  Final   Culture  Setup Time   Final    07/21/2014 11:17 Performed at [facility]    Culture   Final           BLOOD CULTURE RECEIVED NO GROWTH TO DATE CULTURE WILL BE HELD FOR 5 DAYS BEFORE ISSUING A FINAL NEGATIVE REPORT Performed at [facility]    Report Status PENDING  Incomplete  MRSA PCR Screening     Status: None   Collection Time: 07/21/14  5:14 AM  Result Value Ref Range Status   MRSA by PCR NEGATIVE NEGATIVE Final    Comment:        The GeneXpert MRSA Assay (FDA approved for NASAL specimens only), is one component of a comprehensive MRSA colonization surveillance program. It is not intended to diagnose MRSA infection nor to guide or monitor treatment for MRSA infections.     Studies/Results: Dg Chest 2 View  07/20/2014   CLINICAL DATA:  Initial evaluation for fever.  History of COPD.  EXAM: CHEST  2 VIEW  COMPARISON:  Prior radiograph from 04/14/2014  FINDINGS: Cardiac and mediastinal silhouettes are stable in size and contour, and remain within normal limits.  Lungs are normally inflated. There is diffuse peribronchial thickening, suspicious for possible bronchiolitis. No consolidative airspace disease. No pulmonary edema or pleural effusion. No pneumothorax.  Surgical clips overlie the GE junction. No acute osseus abnormality.  IMPRESSION: Prominent diffuse peribronchial thickening, suspicious for bronchiolitis in the setting of fever.  No consolidative airspace disease to suggest pneumonia.   Electronically Signed   By: Jeannine Boga M.D.   On: 07/20/2014 23:55    Medications: Scheduled Meds: . folic acid  1 mg Oral Daily  . Influenza vac split quadrivalent PF  0.5 mL Intramuscular Tomorrow-1000  . levofloxacin (LEVAQUIN) IV  500 mg Intravenous Q24H  . nicotine  21 mg Transdermal Daily  . pantoprazole  40 mg Oral Q1200  . pneumococcal 23 valent vaccine  0.5 mL Intramuscular Tomorrow-1000  . thiamine  100 mg Oral Daily      LOS: 3 days   Elizeo Rodriques M.D. [facility] 07/23/2014, 10:49 AM Pager: [phone]  If 7PM-7AM, please contact night-coverage [email or web address] Password [email or web address]

## 2014-07-23 NOTE — Progress Notes (Signed)
Report given to receiving Nurse. Transferred pt. Down to 2M08. Spoke with pt. Mother and updated her on pt. Status. Will transport pt. Belongings downstairs. No further needs noted at this time.

## 2014-07-24 LAB — PHOSPHORUS: Phosphorus: 3.2 mg/dL (ref 2.3–4.6)

## 2014-07-24 LAB — CBC
HCT: 33.3 % — ABNORMAL LOW (ref 39.0–52.0)
Hemoglobin: 11.1 g/dL — ABNORMAL LOW (ref 13.0–17.0)
MCH: 29.9 pg (ref 26.0–34.0)
MCHC: 33.3 g/dL (ref 30.0–36.0)
MCV: 89.8 fL (ref 78.0–100.0)
Platelets: 445 10*3/uL — ABNORMAL HIGH (ref 150–400)
RBC: 3.71 MIL/uL — ABNORMAL LOW (ref 4.22–5.81)
RDW: 14.9 % (ref 11.5–15.5)
WBC: 10 10*3/uL (ref 4.0–10.5)

## 2014-07-24 LAB — BASIC METABOLIC PANEL
Anion gap: 8 (ref 5–15)
BUN: 5 mg/dL — ABNORMAL LOW (ref 6–23)
CHLORIDE: 95 meq/L — AB (ref 96–112)
CO2: 29 mmol/L (ref 19–32)
CREATININE: 0.7 mg/dL (ref 0.50–1.35)
Calcium: 8.6 mg/dL (ref 8.4–10.5)
GFR calc non Af Amer: >90 mL/min (ref >90)
Glucose, Bld: 104 mg/dL — ABNORMAL HIGH (ref 70–99)
POTASSIUM: 4 mmol/L (ref 3.5–5.1)
Sodium: 132 mmol/L — ABNORMAL LOW (ref 135–145)

## 2014-07-24 LAB — MAGNESIUM: Magnesium: 2 mg/dL (ref 1.5–2.5)

## 2014-07-24 MED ORDER — LORAZEPAM 2 MG/ML IJ SOLN
1.0000 mg | INTRAMUSCULAR | Status: DC | PRN
  Administered 2014-07-24: 4 mg via INTRAVENOUS
  Administered 2014-07-24: 2 mg via INTRAVENOUS
  Filled 2014-07-24: qty 1
  Filled 2014-07-24: qty 2

## 2014-07-24 NOTE — Progress Notes (Signed)
PULMONARY / CRITICAL CARE MEDICINE   Name: Samuel Reyes MRN: [id number] DOB: [birth year]    ADMISSION DATE:  07/20/2014 CONSULTATION DATE:  07/23/2014  REFERRING MD :  Dr. Tana Coast  CHIEF COMPLAINT:  Agitation  INITIAL PRESENTATION:  58 y/o male smoker presented to ED with intentional drug overdose after feeling depressed.  He has hx of ETOH (drinks case of beer per day).  Developed progressive agitation and transferred to ICU 12/25 for alcohol withdrawal.  STUDIES:   SIGNIFICANT EVENTS: 12/22  Admit 12/23  Psych consulted 12/25  Transfer to ICU for precedex    SUBJECTIVE: Pt denies acute physical issues.  Reports he "wants to go to sleep and never wake up".  RN reports weaning of precedex, currently on 0.4 from 0.7.    VITAL SIGNS: Temp:  [98.1 F (36.7 C)-99.8 F (37.7 C)] 98.1 F (36.7 C) (12/26 0738) Pulse Rate:  [29-91] 73 (12/26 0900) Resp:  [15-39] 25 (12/26 0900) BP: (88-172)/(46-115) 88/53 mmHg (12/26 0900) SpO2:  [87 %-100 %] 91 % (12/26 0900) Weight:  [156 lb 4.9 oz (70.9 kg)] 156 lb 4.9 oz (70.9 kg) (12/25 1015)   INTAKE / OUTPUT:  Intake/Output Summary (Last 24 hours) at 07/24/14 0943 Last data filed at 07/24/14 0900  Gross per 24 hour  Intake  756.9 ml  Output   1450 ml  Net -693.1 ml    PHYSICAL EXAMINATION: General: adult male in no acute distress Neuro: drowsy, follows commands, normal strength, CN intact HEENT: pupils reactive, no sinus tenderness Cardiovascular: regular, no murmur Lungs: no wheeze Abdomen: soft, non tender Musculoskeletal: no edema Skin: no rashes  LABS:  CBC  Recent Labs Lab 07/20/14 1750 07/21/14 0656 07/24/14 0240  WBC 13.5* 10.7* 10.0  HGB 10.6* 10.2* 11.1*  HCT 31.4* 31.3* 33.3*  PLT 482* 466* 445*   BMET  Recent Labs Lab 07/21/14 0656 07/22/14 0318 07/24/14 0240  NA 128* 135 132*  K 3.9 3.9 4.0  CL 90* 97 95*  CO2 27 30 29   BUN 7 <5* 5*  CREATININE 0.83 0.71 0.70  GLUCOSE 90 104* 104*    Electrolytes  Recent Labs Lab 07/21/14 0656 07/22/14 0318 07/24/14 0240  CALCIUM 8.0* 8.0* 8.6  MG  --   --  2.0  PHOS  --   --  3.2   Liver Enzymes  Recent Labs Lab 07/20/14 2326 07/21/14 0656 07/22/14 0318  AST 23 16 13   ALT 13 12 10   ALKPHOS 97 92 80  BILITOT 0.3 0.6 0.2*  ALBUMIN 2.7* 2.4* 2.1*   Cardiac Enzymes  Recent Labs Lab 07/20/14 1750 07/20/14 2046 07/21/14 0656  TROPONINI <0.03 <0.03 <0.03   Glucose  Recent Labs Lab 07/22/14 2349 07/23/14 1015  GLUCAP 153* 95    Imaging No results found.   ASSESSMENT / PLAN: NEUROLOGIC A:   Alcohol withdrawal. Intentional drug overdose with suicidal ideation. Hx of depression. P:   Thiamine, folic acid, MVI Precedex 12/25, wean as able Sitter at bedside F/u with psych Initial PSY note recommends inpatient Rx  PULMONARY A: Hx of COPD. Tobacco abuse. P:   PRN BD's Bronchial hygiene Oxygen if needed to keep sats 88-95% F/u CXR as needed Nicotine patch  CARDIOVASCULAR A:  Hx of HTN. P:  D/c lopressor >> re-assess needs after starting precedex  RENAL A:   Hyponatremia likely from ETOH abuse >> improved. P:   Monitor renal fx, urine outpt, electrolytes  GASTROINTESTINAL A:   Alcoholic cirrhosis. Nutrition. P:   Regular diet  Protonix for SUP  HEMATOLOGIC A:   Anemia of chronic disease. P:  F/u CBC  INFECTIOUS A:   AECOPD. P:   Monitor off abx  Blood cx 12/23 >>   ENDOCRINE A:   No acute issues. P:   Monitor blood sugar on BMET  FAMILY  - Updates: no family available on am rounds 12/26  - Inter-disciplinary family meet or Palliative Care meeting due by 07/30/14:     Noe Gens, NP-C [place] Pgr: [phone] or [phone]  Reviewed above, examined.  Has decreased appetite, mild abdominal discomfort.  Denies chest pain or dyspnea.  Breath sounds clear, abdomen soft.  Off precedex now.  Will restart CIWA.  If stable, then likely can  transfer out of ICU later today.  Will ask [facility] to resume care from 12/27.  Chesley Mires, MD [facility] Pulmonary/Critical Care 07/24/2014, 10:34 AM Pager:  [phone] After 3pm call: [phone]

## 2014-07-25 DIAGNOSIS — T50901D Poisoning by unspecified drugs, medicaments and biological substances, accidental (unintentional), subsequent encounter: Secondary | ICD-10-CM

## 2014-07-25 DIAGNOSIS — F10221 Alcohol dependence with intoxication delirium: Secondary | ICD-10-CM

## 2014-07-25 LAB — COMPREHENSIVE METABOLIC PANEL
ALT: 11 U/L (ref 0–53)
ANION GAP: 9 (ref 5–15)
AST: 13 U/L (ref 0–37)
Albumin: 2.5 g/dL — ABNORMAL LOW (ref 3.5–5.2)
Alkaline Phosphatase: 80 U/L (ref 39–117)
BUN: 10 mg/dL (ref 6–23)
CALCIUM: 8.4 mg/dL (ref 8.4–10.5)
CO2: 23 mmol/L (ref 19–32)
Chloride: 97 mEq/L (ref 96–112)
Creatinine, Ser: 0.84 mg/dL (ref 0.50–1.35)
GFR calc non Af Amer: >90 mL/min (ref >90)
GLUCOSE: 102 mg/dL — AB (ref 70–99)
Potassium: 4.2 mmol/L (ref 3.5–5.1)
SODIUM: 129 mmol/L — AB (ref 135–145)
TOTAL PROTEIN: 6.1 g/dL (ref 6.0–8.3)
Total Bilirubin: 0.1 mg/dL — ABNORMAL LOW (ref 0.3–1.2)

## 2014-07-25 LAB — CBC
HCT: 35.2 % — ABNORMAL LOW (ref 39.0–52.0)
HEMOGLOBIN: 11.3 g/dL — AB (ref 13.0–17.0)
MCH: 28.8 pg (ref 26.0–34.0)
MCHC: 32.1 g/dL (ref 30.0–36.0)
MCV: 89.6 fL (ref 78.0–100.0)
Platelets: 408 10*3/uL — ABNORMAL HIGH (ref 150–400)
RBC: 3.93 MIL/uL — AB (ref 4.22–5.81)
RDW: 15 % (ref 11.5–15.5)
WBC: 13.5 10*3/uL — AB (ref 4.0–10.5)

## 2014-07-25 MED ORDER — SODIUM CHLORIDE 0.9 % IV SOLN
INTRAVENOUS | Status: DC
  Administered 2014-07-25 – 2014-07-27 (×4): via INTRAVENOUS

## 2014-07-25 MED ORDER — LORAZEPAM 2 MG/ML IJ SOLN
1.0000 mg | INTRAMUSCULAR | Status: DC | PRN
  Administered 2014-07-25 – 2014-07-27 (×8): 2 mg via INTRAVENOUS
  Filled 2014-07-25 (×7): qty 1

## 2014-07-25 MED ORDER — FAMOTIDINE 20 MG PO TABS
20.0000 mg | ORAL_TABLET | Freq: Two times a day (BID) | ORAL | Status: DC
  Administered 2014-07-25 – 2014-07-27 (×5): 20 mg via ORAL
  Filled 2014-07-25 (×6): qty 1

## 2014-07-25 MED ORDER — LORAZEPAM 2 MG/ML IJ SOLN
INTRAMUSCULAR | Status: AC
  Filled 2014-07-25: qty 1

## 2014-07-25 MED ORDER — NYSTATIN 100000 UNIT/ML MT SUSP
5.0000 mL | Freq: Four times a day (QID) | OROMUCOSAL | Status: DC
  Administered 2014-07-25 – 2014-07-27 (×8): 500000 [IU] via ORAL
  Filled 2014-07-25 (×11): qty 5

## 2014-07-25 NOTE — Progress Notes (Signed)
Patient ID: Violet Cart  male  IWO:[id number]    DOB: [birth year]    DOA: 07/20/2014  PCP: Lorayne Marek, MD  Brief history of present illness  58 year old male with major depression, COPD, ongoing alcohol and tobacco abuse, presented with drug overdose. Patient noted suicidal ideation, took 4 or 5 tablets of Abilify and trazodone with intention of hurting himself. Patient also took vodka, along with the tablets. Patient was admitted to stepdown unit for observation, he was transferred to telemetry floor on 12/24.  Assessment/Plan: Principal Problem: Acute alcohol withdrawals: drinks a case of beer every day, was intoxicated at the time of admission, EtOH level 151. [name] to ICU for Precedex infusion and has now been transferred out-continues to feel anxious-continue Ativan when necessary  Intentional drug  Overdose - Continue suicide precautions, sitter - Continue supportive care, LFTs within normal limits - Psych consulted-have not returned since 12/23-will reconsult - Follow EKG closely  Hyponatremia -Recurrent-resume normal saline-    COPD (chronic obstructive pulmonary disease) with mild acute bronchitis -Currently stable, no wheezing - continue IV levofloxacin, duo nebs  Smoker Nicotine patch   major depression - Psych consulted- have recalled as they last saw him on the 23rd  GERD: Placed on PPI IV- added Pepcid today due to continued complaints  DVT Prophylaxis: Lovenox   Code Status: full code   Family Communication:  Disposition: transfer to floor   Consultants: Psychiatry CCM  Procedures  None  Antibiotics :  IV levofloxacin   Subjective : Patient c/o anxiety, nausea, poor appetite, very bad reflux despite PPI - had a headache but that his better now.   Objective : Weight change:   Intake/Output Summary (Last 24 hours) at 07/25/14 1254 Last data filed at 07/25/14 1100  Gross per 24 hour  Intake 1208.4 ml  Output   1025 ml  Net  183.4  ml   Blood pressure 109/67, pulse 87, temperature 97.9 F (36.6 C), temperature source Oral, resp. rate 16, height 5\' 5"  (1.651 m), weight 70.9 kg (156 lb 4.9 oz), SpO2 96 %.  Physical Exam: General: Awake alert oriented 3 CVS:S1 and S2 normal, no murmurs Chest:  CTAB Abdomen: soft nontender, nondistended, normal bowel sounds  Extremities: no cyanosis, clubbing or edema noted bilaterally  Lab Results: Basic Metabolic Panel:  Recent Labs Lab 07/24/14 0240 07/25/14 0401  NA 132* 129*  K 4.0 4.2  CL 95* 97  CO2 29 23  GLUCOSE 104* 102*  BUN 5* 10  CREATININE 0.70 0.84  CALCIUM 8.6 8.4  MG 2.0  --   PHOS 3.2  --    Liver Function Tests:  Recent Labs Lab 07/22/14 0318 07/25/14 0401  AST 13 13  ALT 10 11  ALKPHOS 80 80  BILITOT 0.2* 0.1*  PROT 5.3* 6.1  ALBUMIN 2.1* 2.5*   No results for input(s): LIPASE, AMYLASE in the last 168 hours. No results for input(s): AMMONIA in the last 168 hours. CBC:  Recent Labs Lab 07/21/14 0656 07/24/14 0240 07/25/14 0401  WBC 10.7* 10.0 13.5*  NEUTROABS 7.9*  --   --   HGB 10.2* 11.1* 11.3*  HCT 31.3* 33.3* 35.2*  MCV 88.7 89.8 89.6  PLT 466* 445* 408*   Cardiac Enzymes:  Recent Labs Lab 07/20/14 1750 07/20/14 2046 07/21/14 0656  TROPONINI <0.03 <0.03 <0.03   BNP: Invalid input(s): POCBNP CBG:  Recent Labs Lab 07/22/14 2349 07/23/14 1015  GLUCAP 153* 95     Micro Results: Recent Results (from the past  240 hour(s))  Blood culture (routine x 2)     Status: None (Preliminary result)   Collection Time: 07/21/14 12:30 AM  Result Value Ref Range Status   Specimen Description BLOOD RIGHT ARM  Final   Special Requests BOTTLES DRAWN AEROBIC AND ANAEROBIC 10CC  Final   Culture  Setup Time   Final    07/21/2014 11:17 Performed at [facility]    Culture   Final           BLOOD CULTURE RECEIVED NO GROWTH TO DATE CULTURE WILL BE HELD FOR 5 DAYS BEFORE ISSUING A FINAL NEGATIVE REPORT Performed at  [facility]    Report Status PENDING  Incomplete  Blood culture (routine x 2)     Status: None (Preliminary result)   Collection Time: 07/21/14 12:40 AM  Result Value Ref Range Status   Specimen Description BLOOD RIGHT HAND  Final   Special Requests BOTTLES DRAWN AEROBIC ONLY 10CC  Final   Culture  Setup Time   Final    07/21/2014 11:17 Performed at [facility]    Culture   Final           BLOOD CULTURE RECEIVED NO GROWTH TO DATE CULTURE WILL BE HELD FOR 5 DAYS BEFORE ISSUING A FINAL NEGATIVE REPORT Performed at [facility]    Report Status PENDING  Incomplete  MRSA PCR Screening     Status: None   Collection Time: 07/21/14  5:14 AM  Result Value Ref Range Status   MRSA by PCR NEGATIVE NEGATIVE Final    Comment:        The GeneXpert MRSA Assay (FDA approved for NASAL specimens only), is one component of a comprehensive MRSA colonization surveillance program. It is not intended to diagnose MRSA infection nor to guide or monitor treatment for MRSA infections.     Studies/Results: Dg Chest 2 View  07/20/2014   CLINICAL DATA:  Initial evaluation for fever.  History of COPD.  EXAM: CHEST  2 VIEW  COMPARISON:  Prior radiograph from 04/14/2014  FINDINGS: Cardiac and mediastinal silhouettes are stable in size and contour, and remain within normal limits.  Lungs are normally inflated. There is diffuse peribronchial thickening, suspicious for possible bronchiolitis. No consolidative airspace disease. No pulmonary edema or pleural effusion. No pneumothorax.  Surgical clips overlie the GE junction. No acute osseus abnormality.  IMPRESSION: Prominent diffuse peribronchial thickening, suspicious for bronchiolitis in the setting of fever. No consolidative airspace disease to suggest pneumonia.   Electronically Signed   By: Jeannine Boga M.D.   On: 07/20/2014 23:55    Medications: Scheduled Meds: . famotidine  20 mg Oral BID  . folic acid  1 mg Oral Daily    . nicotine  21 mg Transdermal Daily  . nystatin  5 mL Oral QID  . pantoprazole  40 mg Oral Q1200  . thiamine  100 mg Oral Daily      LOS: 5 days   Debbe Odea, M.D. [facility] 07/25/2014, 12:54 PM Pager- [email or web address] Password [email or web address]
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
## 2014-07-26 LAB — OSMOLALITY, URINE: Osmolality, Ur: 418 mOsm/kg (ref 390–1090)

## 2014-07-26 NOTE — Progress Notes (Addendum)
Patient ID: Samuel Reyes  male  RCB:[id number]    DOB: [birth year]    DOA: 07/20/2014  PCP: Lorayne Marek, MD  Primary Psychiatrist: Dr. Derwood Kaplan in [place], [place]  Brief history of present illness  58 year old male with major depression, COPD, ongoing alcohol and tobacco abuse, presented with drug overdose. Patient noted suicidal ideation, took 4 or 5 tablets of Abilify and trazodone with intention of hurting himself. Patient also took vodka, along with the tablets. Patient was admitted to stepdown unit for observation, he was transferred to telemetry floor on 12/24.  Assessment/Plan: Principal Problem: Acute alcohol withdrawals: drinks a case of beer every day, was intoxicated at the time of admission, EtOH level 151. [name] to ICU for Precedex infusion and has now been transferred out-continues to feel anxious-continue Ativan when necessary. No overt withdrawal.  Intentional drug  Overdose - Continue suicide precautions, sitter - Continue supportive care, LFTs within normal limits - Psychiatry has been reconsulted-awaiting input - Patient states that his family/friends were bothering him a whole lot during Christmas time and hence he took excess medications along with alcohol to "ride through" Christmas.  - Denies suicidal or homicidal ideations.   Hyponatremia -Recurrent-resume normal saline- - Unclear etiology. Clinically euvolemic. We will check serum and urine osmolarity.    COPD (chronic obstructive pulmonary disease) with mild acute bronchitis -Currently stable, no wheezing - continue IV levofloxacin, duo nebs  Smoker Nicotine patch   major depression - Psych consulted- input pending.  GERD: Placed on PPI IV- added Pepcid today due to continued complaints  Anemia - stable  DVT Prophylaxis: Lovenox  Code Status: full code  Family Communication: none at bedside.  Disposition: To be determined  Consultants: Psychiatry CCM  Procedures   None  Antibiotics :  IV levofloxacin   Subjective : Feels anxious. Denies suicidal or homicidal ideations.  Objective : Weight change:   Intake/Output Summary (Last 24 hours) at 07/26/14 1527 Last data filed at 07/26/14 1433  Gross per 24 hour  Intake   2360 ml  Output    350 ml  Net   2010 ml   Filed Vitals:   07/25/14 1554 07/25/14 1615 07/25/14 2222 07/26/14 0420  BP:  116/68 115/64 144/92  Pulse:  109 99 89  Temp: 98 F (36.7 C) 98 F (36.7 C) 98.5 F (36.9 C) 98.5 F (36.9 C)  TempSrc: Oral Oral Oral Oral  Resp:  18 18 18   Height:      Weight:      SpO2:  99% 98% 94%    Physical Exam: General: Awake alert oriented 3 CVS:S1 and S2 normal, no murmurs Chest:  CTAB. No increased work of breathing.  Abdomen: soft nontender, nondistended, normal bowel sounds  Extremities: no cyanosis, clubbing or edema noted bilaterally  Lab Results: Basic Metabolic Panel:  Recent Labs Lab 07/24/14 0240 07/25/14 0401  NA 132* 129*  K 4.0 4.2  CL 95* 97  CO2 29 23  GLUCOSE 104* 102*  BUN 5* 10  CREATININE 0.70 0.84  CALCIUM 8.6 8.4  MG 2.0  --   PHOS 3.2  --    Liver Function Tests:  Recent Labs Lab 07/22/14 0318 07/25/14 0401  AST 13 13  ALT 10 11  ALKPHOS 80 80  BILITOT 0.2* 0.1*  PROT 5.3* 6.1  ALBUMIN 2.1* 2.5*   No results for input(s): LIPASE, AMYLASE in the last 168 hours. No results for input(s): AMMONIA in the last 168 hours. CBC:  Recent Labs Lab  07/21/14 0656 07/24/14 0240 07/25/14 0401  WBC 10.7* 10.0 13.5*  NEUTROABS 7.9*  --   --   HGB 10.2* 11.1* 11.3*  HCT 31.3* 33.3* 35.2*  MCV 88.7 89.8 89.6  PLT 466* 445* 408*   Cardiac Enzymes:  Recent Labs Lab 07/20/14 1750 07/20/14 2046 07/21/14 0656  TROPONINI <0.03 <0.03 <0.03   BNP: Invalid input(s): POCBNP CBG:  Recent Labs Lab 07/22/14 2349 07/23/14 1015  GLUCAP 153* 95     Micro Results: Recent Results (from the past 240 hour(s))  Blood culture (routine x 2)      Status: None (Preliminary result)   Collection Time: 07/21/14 12:30 AM  Result Value Ref Range Status   Specimen Description BLOOD RIGHT ARM  Final   Special Requests BOTTLES DRAWN AEROBIC AND ANAEROBIC 10CC  Final   Culture  Setup Time   Final    07/21/2014 11:17 Performed at [facility]    Culture   Final           BLOOD CULTURE RECEIVED NO GROWTH TO DATE CULTURE WILL BE HELD FOR 5 DAYS BEFORE ISSUING A FINAL NEGATIVE REPORT Performed at [facility]    Report Status PENDING  Incomplete  Blood culture (routine x 2)     Status: None (Preliminary result)   Collection Time: 07/21/14 12:40 AM  Result Value Ref Range Status   Specimen Description BLOOD RIGHT HAND  Final   Special Requests BOTTLES DRAWN AEROBIC ONLY 10CC  Final   Culture  Setup Time   Final    07/21/2014 11:17 Performed at [facility]    Culture   Final           BLOOD CULTURE RECEIVED NO GROWTH TO DATE CULTURE WILL BE HELD FOR 5 DAYS BEFORE ISSUING A FINAL NEGATIVE REPORT Performed at [facility]    Report Status PENDING  Incomplete  MRSA PCR Screening     Status: None   Collection Time: 07/21/14  5:14 AM  Result Value Ref Range Status   MRSA by PCR NEGATIVE NEGATIVE Final    Comment:        The GeneXpert MRSA Assay (FDA approved for NASAL specimens only), is one component of a comprehensive MRSA colonization surveillance program. It is not intended to diagnose MRSA infection nor to guide or monitor treatment for MRSA infections.     Studies/Results: Dg Chest 2 View  07/20/2014   CLINICAL DATA:  Initial evaluation for fever.  History of COPD.  EXAM: CHEST  2 VIEW  COMPARISON:  Prior radiograph from 04/14/2014  FINDINGS: Cardiac and mediastinal silhouettes are stable in size and contour, and remain within normal limits.  Lungs are normally inflated. There is diffuse peribronchial thickening, suspicious for possible bronchiolitis. No consolidative airspace disease. No  pulmonary edema or pleural effusion. No pneumothorax.  Surgical clips overlie the GE junction. No acute osseus abnormality.  IMPRESSION: Prominent diffuse peribronchial thickening, suspicious for bronchiolitis in the setting of fever. No consolidative airspace disease to suggest pneumonia.   Electronically Signed   By: Jeannine Boga M.D.   On: 07/20/2014 23:55    Medications: Scheduled Meds: . famotidine  20 mg Oral BID  . folic acid  1 mg Oral Daily  . nicotine  21 mg Transdermal Daily  . nystatin  5 mL Oral QID  . pantoprazole  40 mg Oral Q1200  . thiamine  100 mg Oral Daily      LOS: 6 days   Ieasha Boerema, MD,  FACP, FHM. [facility] Pager [phone]  If 7PM-7AM, please contact night-coverage [email or web address] Password [facility] 07/26/2014, 3:38 PM

## 2014-07-27 DIAGNOSIS — F332 Major depressive disorder, recurrent severe without psychotic features: Secondary | ICD-10-CM

## 2014-07-27 DIAGNOSIS — F10239 Alcohol dependence with withdrawal, unspecified: Secondary | ICD-10-CM

## 2014-07-27 LAB — CULTURE, BLOOD (ROUTINE X 2)
Culture: NO GROWTH
Culture: NO GROWTH

## 2014-07-27 LAB — COMPREHENSIVE METABOLIC PANEL
ALK PHOS: 71 U/L (ref 39–117)
ALT: 13 U/L (ref 0–53)
AST: 16 U/L (ref 0–37)
Albumin: 2.6 g/dL — ABNORMAL LOW (ref 3.5–5.2)
Anion gap: 7 (ref 5–15)
BUN: <5 mg/dL — ABNORMAL LOW (ref 6–23)
CO2: 25 mmol/L (ref 19–32)
Calcium: 8.5 mg/dL (ref 8.4–10.5)
Chloride: 102 mEq/L (ref 96–112)
Creatinine, Ser: 0.66 mg/dL (ref 0.50–1.35)
Glucose, Bld: 95 mg/dL (ref 70–99)
POTASSIUM: 3.7 mmol/L (ref 3.5–5.1)
SODIUM: 134 mmol/L — AB (ref 135–145)
Total Bilirubin: 0.2 mg/dL — ABNORMAL LOW (ref 0.3–1.2)
Total Protein: 5.9 g/dL — ABNORMAL LOW (ref 6.0–8.3)

## 2014-07-27 LAB — OSMOLALITY: Osmolality: 277 mOsm/kg (ref 275–300)

## 2014-07-27 MED ORDER — ACETAMINOPHEN 325 MG PO TABS
650.0000 mg | ORAL_TABLET | Freq: Four times a day (QID) | ORAL | Status: DC | PRN
Start: 2014-07-27 — End: 2014-08-09

## 2014-07-27 MED ORDER — THIAMINE HCL 100 MG PO TABS: 100.0000 mg | ORAL_TABLET | Freq: Every day | ORAL | Status: DC

## 2014-07-27 MED ORDER — FOLIC ACID 1 MG PO TABS: 1.0000 mg | ORAL_TABLET | Freq: Every day | ORAL | Status: DC

## 2014-07-27 MED ORDER — FAMOTIDINE 20 MG PO TABS: 20.0000 mg | ORAL_TABLET | Freq: Two times a day (BID) | ORAL | Status: DC

## 2014-07-27 NOTE — Progress Notes (Signed)
Suicide orders discontinued. Gloves and trash cans place back in room. Patient has been assessed- he is asleep. Will continue to monitor

## 2014-07-27 NOTE — Discharge Summary (Signed)
Physician Discharge Summary  Samuel Reyes FIE:[id number] DOB: Sep 07, 1955 DOA: 07/20/2014  PCP: Lorayne Marek, MD  Primary Psychiatrist: Dr. Derwood Kaplan in [place], [place] date: 07/20/2014 Discharge date: 07/27/2014  Time spent: Greater than 30 minutes  Recommendations for Outpatient Follow-up:  1. [facility]/Psychiatry on 08/12/2013 at 2 pm. 2. Dr. Lorayne Marek, PCP in 1 week with repeat labs (CBC & CMP).  Discharge Diagnoses:  Principal Problem:   Overdose Active Problems:   COPD (chronic obstructive pulmonary disease)   Alcohol dependence   Suicidal ideations   Drug overdose   Alcohol dependence with uncomplicated withdrawal   Discharge Condition: Improved & Stable  Diet recommendation: Heart healthy diet.  Filed Weights   07/20/14 1633 07/21/14 0500 07/23/14 1015  Weight: 70.308 kg (155 lb) 68.947 kg (152 lb) 70.9 kg (156 lb 4.9 oz)    History of present illness:  58 year old male with major depression, COPD, ongoing alcohol and tobacco abuse, presented with drug overdose. Patient noted suicidal ideation, took 4 or 5 tablets of Abilify and trazodone with intention of hurting himself. Patient also took vodka, along with the tablets. Patient was admitted to stepdown unit for observation, he was transferred to telemetry floor on 12/24.  Hospital Course:   Principal Problem: Acute alcohol withdrawals: drinks a case of beer every day, was intoxicated at the time of admission, EtOH level 151. [name] to ICU for Precedex infusion and was transferred out-no further anxious feelings. No overt withdrawal. Continue thiamine and folate. Case management has arranged for outpatient follow-up with psychiatry at [facility] and patient has resources to attend [place] meetings.  Intentional drug Overdose - suicide precautions, sitter - Continue supportive care, LFTs within normal limits - Patient states that his family/friends were bothering him a whole lot during Christmas time and  hence he took excess medications along with alcohol to "ride through" Christmas.  - Denies suicidal or homicidal ideations. - Psychiatry evaluated patient on 12/29 and have cleared patient for discharge home to his mother with follow-up with his providers at [facility] and alcohol anonymous for sobriety.   Hyponatremia - Unclear etiology. Clinically euvolemic. We will check serum and urine osmolarity. Osmolarity 277 and 418 respectively. Improved. Outpatient follow-up with repeat CMP in 1 week. May be related to excess consumption of beers.   COPD (chronic obstructive pulmonary disease) with mild acute bronchitis - Stable.  Smoker Nicotine patch. Cessation counseled.  major depression - Psych has consulted but no specific recommendations regarding medications which were held in the hospital. We'll continue to hold Abilify, Prozac and trazodone until outpatient follow-up with psychiatry. We'll resume gabapentin for pain related issues.  GERD: Continue PPI and H2 blockers. Asymptomatic now.  Anemia - stable. Outpatient follow-up.  Consultations:  Psychiatry  Procedures:  None    Discharge Exam:  Complaints:  Denies complaints. Denies anxiety, suicidal or homicidal ideations.  Filed Vitals:   07/26/14 0420 07/26/14 2011 07/26/14 2336 07/27/14 0353  BP: 144/92 117/64 126/67 142/84  Pulse: 89 97 84   Temp: 98.5 F (36.9 C) 99.2 F (37.3 C) 99.2 F (37.3 C) 98.5 F (36.9 C)  TempSrc: Oral Oral Oral Oral  Resp: 18 15 19 15   Height:      Weight:      SpO2: 94% 96% 95% 98%   General: Awake alert oriented 3 CVS:S1 and S2 normal, no murmurs Chest: CTAB. No increased work of breathing.  Abdomen: soft nontender, nondistended, normal bowel sounds  Extremities: no cyanosis, clubbing or edema noted bilaterally  Discharge  Instructions      Discharge Instructions    Call MD for:    Complete by:  As directed   Suicidal thoughts.     Diet - low sodium heart healthy     Complete by:  As directed      Increase activity slowly    Complete by:  As directed             Medication List    STOP taking these medications        ARIPiprazole 5 MG tablet  Commonly known as:  ABILIFY     FLUoxetine 20 MG capsule  Commonly known as:  PROZAC     naproxen 500 MG tablet  Commonly known as:  NAPROSYN     OVER THE COUNTER MEDICATION     traZODone 100 MG tablet  Commonly known as:  DESYREL      TAKE these medications        acetaminophen 325 MG tablet  Commonly known as:  TYLENOL  Take 2 tablets (650 mg total) by mouth every 6 (six) hours as needed for mild pain, moderate pain or headache (or Fever >/= 101).     albuterol 108 (90 BASE) MCG/ACT inhaler  Commonly known as:  PROVENTIL HFA;VENTOLIN HFA  Inhale 2 puffs into the lungs every 6 (six) hours as needed for wheezing or shortness of breath.     albuterol-ipratropium 18-103 MCG/ACT inhaler  Commonly known as:  COMBIVENT  Inhale 2 puffs into the lungs 2 (two) times daily. For shortness of breath     famotidine 20 MG tablet  Commonly known as:  PEPCID  Take 1 tablet (20 mg total) by mouth 2 (two) times daily.     folic acid 1 MG tablet  Commonly known as:  FOLVITE  Take 1 tablet (1 mg total) by mouth daily.     gabapentin 300 MG capsule  Commonly known as:  NEURONTIN  Take 1 capsule (300 mg total) by mouth 2 (two) times daily. For substance withdrawal syndrome     metoprolol tartrate 25 MG tablet  Commonly known as:  LOPRESSOR  Take 1 tablet (25 mg total) by mouth 2 (two) times daily. For high blood pressure     nicotine 21 mg/24hr patch  Commonly known as:  NICODERM CQ - dosed in mg/24 hours  Place 21 mg onto the skin daily.     omeprazole 20 MG capsule  Commonly known as:  PRILOSEC  Take 20 mg by mouth daily.     thiamine 100 MG tablet  Take 1 tablet (100 mg total) by mouth daily.       Follow-up Information    Follow up with [facility].   Specialty:  Behavioral Health   Why:   appt on 08/12/2014 at 2:00 pm   Contact information:   [place] [address] [phone]       Follow up with Lorayne Marek, MD. Schedule an appointment as soon as possible for a visit in 1 week.   Specialty:  Internal Medicine   Why:  To be seen with repeat labs (CBC & CMP).   Contact information:   [address] [address] [phone]        The results of significant diagnostics from this hospitalization (including imaging, microbiology, ancillary and laboratory) are listed below for reference.    Significant Diagnostic Studies: Dg Chest 2 View  07/20/2014   CLINICAL DATA:  Initial evaluation for fever.  History of COPD.  EXAM: CHEST  2 VIEW  COMPARISON:  Prior radiograph from 04/14/2014  FINDINGS: Cardiac and mediastinal silhouettes are stable in size and contour, and remain within normal limits.  Lungs are normally inflated. There is diffuse peribronchial thickening, suspicious for possible bronchiolitis. No consolidative airspace disease. No pulmonary edema or pleural effusion. No pneumothorax.  Surgical clips overlie the GE junction. No acute osseus abnormality.  IMPRESSION: Prominent diffuse peribronchial thickening, suspicious for bronchiolitis in the setting of fever. No consolidative airspace disease to suggest pneumonia.   Electronically Signed   By: Jeannine Boga M.D.   On: 07/20/2014 23:55    Microbiology: Recent Results (from the past 240 hour(s))  Blood culture (routine x 2)     Status: None   Collection Time: 07/21/14 12:30 AM  Result Value Ref Range Status   Specimen Description BLOOD RIGHT ARM  Final   Special Requests BOTTLES DRAWN AEROBIC AND ANAEROBIC 10CC  Final   Culture  Setup Time   Final    07/21/2014 11:17 Performed at [facility]    Culture   Final    NO GROWTH 5 DAYS Performed at [facility]    Report Status 07/27/2014 FINAL  Final  Blood culture (routine x 2)     Status: None    Collection Time: 07/21/14 12:40 AM  Result Value Ref Range Status   Specimen Description BLOOD RIGHT HAND  Final   Special Requests BOTTLES DRAWN AEROBIC ONLY 10CC  Final   Culture  Setup Time   Final    07/21/2014 11:17 Performed at [facility]    Culture   Final    NO GROWTH 5 DAYS Performed at [facility]    Report Status 07/27/2014 FINAL  Final  MRSA PCR Screening     Status: None   Collection Time: 07/21/14  5:14 AM  Result Value Ref Range Status   MRSA by PCR NEGATIVE NEGATIVE Final    Comment:        The GeneXpert MRSA Assay (FDA approved for NASAL specimens only), is one component of a comprehensive MRSA colonization surveillance program. It is not intended to diagnose MRSA infection nor to guide or monitor treatment for MRSA infections.      Labs: Basic Metabolic Panel:  Recent Labs Lab 07/21/14 0656 07/22/14 0318 07/24/14 0240 07/25/14 0401 07/27/14 0448  NA 128* 135 132* 129* 134*  K 3.9 3.9 4.0 4.2 3.7  CL 90* 97 95* 97 102  CO2 27 30 29 23 25   GLUCOSE 90 104* 104* 102* 95  BUN 7 <5* 5* 10 <5*  CREATININE 0.83 0.71 0.70 0.84 0.66  CALCIUM 8.0* 8.0* 8.6 8.4 8.5  MG  --   --  2.0  --   --   PHOS  --   --  3.2  --   --    Liver Function Tests:  Recent Labs Lab 07/20/14 2326 07/21/14 0656 07/22/14 0318 07/25/14 0401 07/27/14 0448  AST 23 16 13 13 16   ALT 13 12 10 11 13   ALKPHOS 97 92 80 80 71  BILITOT 0.3 0.6 0.2* 0.1* 0.2*  PROT 6.1 5.7* 5.3* 6.1 5.9*  ALBUMIN 2.7* 2.4* 2.1* 2.5* 2.6*   No results for input(s): LIPASE, AMYLASE in the last 168 hours. No results for input(s): AMMONIA in the last 168 hours. CBC:  Recent Labs Lab 07/20/14 1750 07/21/14 0656 07/24/14 0240 07/25/14 0401  WBC 13.5* 10.7* 10.0 13.5*  NEUTROABS  --  7.9*  --   --   HGB 10.6* 10.2* 11.1* 11.3*  HCT 31.4* 31.3* 33.3* 35.2*  MCV 87.2 88.7 89.8 89.6  PLT 482* 466* 445* 408*   Cardiac Enzymes:  Recent Labs Lab 07/20/14 1750  07/20/14 2046 07/21/14 0656  TROPONINI <0.03 <0.03 <0.03   BNP: BNP (last 3 results)  Recent Labs  04/14/14 1911  PROBNP 49.4   CBG:  Recent Labs Lab 07/22/14 2349 07/23/14 1015  GLUCAP 153* 95     Additional labs: 1. Serum osmolarity 277 2. Serum salicylate level less than 4 and serum acetaminophen level less than 10 3. TSH: 0.628. 4. Urine osmolarity 418 5. UDS: Negative   Signed:  Vernell Leep, MD, FACP, FHM. [facility] Pager [phone]  If 7PM-7AM, please contact night-coverage [email or web address] Password [facility] 07/27/2014, 12:38 PM

## 2014-07-27 NOTE — Consult Note (Signed)
Samuel Reyes   Reason for Reyes:  Intentional overdose; alcohol dependence Referring Physician:  Dr. Tana Coast  Samuel Reyes is an 58 y.o. male. Total Time spent with patient: 45 minutes  Assessment: AXIS I:  Alcohol Abuse and Major Depression, Recurrent severe; alcohol dependence with uncomplicated withdrawal AXIS II:  Deferred AXIS III:   Past Medical History  Diagnosis Date  . Cirrhosis   . Degenerative joint disease   . Hypertension   . Gastric ulcer   . Depression   . Asthma   . Mitral valve prolapse 2002  . H/O hiatal hernia   . COPD (chronic obstructive pulmonary disease)   . TIA (transient ischemic attack)     2010  . Stroke     TIA - 2010 - no deficits   . Shortness of breath     with exertion   . GERD (gastroesophageal reflux disease)   . Headache(784.0)   . Hiatal hernia 1982  . Degenerative joint disease    AXIS IV:  Chronic physical and alcohol dependence issues AXIS V: 70;mild symptoms  Plan:  Supportive therapy.  Discharge home to his mother, where he lives, with follow-up with his regular providers at [place] and [place] for sobriety, obtain a sponsor (already affiliated with a group).  Dr. Sabra Reyes assessed the patient and concurs with the plan.  Subjective:   Samuel Reyes is a 58 y.o. male patient does not warrant admission  HPI: ON admission, the patient had been drinking and overdosed.  At that time, he [place] the [place], "[place]ing out".  His mother would not do "anything for me until I got help with my drinking."  He drinks daily, usually 2-3 large beers daily but the day he overdosed, he drank a case of beer.  He has been in the hospital for almost a week.  He has stabilized and denies suicidal ideations, plans on living with his mother and step-dad but plans on getting his own place with his disability money.  Samuel Reyes states he family overwhelmed him with support during his hospitalization.  He does not want inpatient  hospitalization for his depression or alcohol issues, multiple detox and rehab.  Prefers to go to [place] and plans on getting a sponsor.  He goes to [facility], for his medication--Prozac 80 mg daily, and therapy.  Denies homicidal ideations, hallucinations, and drug abuse. Calm, cooperative, pleasant, watching tv and conversing with his sitter on arrival. HPI Elements:   Location:  generalized. Quality:  acute. Severity:  severe. Timing:  intermittent. Duration:  couple of weeks. Context:  stressors.  Past Psychiatric History: Past Medical History  Diagnosis Date  . Cirrhosis   . Degenerative joint disease   . Hypertension   . Gastric ulcer   . Depression   . Asthma   . Mitral valve prolapse 2002  . H/O hiatal hernia   . COPD (chronic obstructive pulmonary disease)   . TIA (transient ischemic attack)     2010  . Stroke     TIA - 2010 - no deficits   . Shortness of breath     with exertion   . GERD (gastroesophageal reflux disease)   . Headache(784.0)   . Hiatal hernia 1982  . Degenerative joint disease     reports that he has been smoking Cigarettes.  He started smoking about 48 years ago. He has a 45 pack-year smoking history. He has never used smokeless tobacco. He reports that he drinks about 3.6 oz  of alcohol per week. He reports that he does not use illicit drugs. Family History  Problem Relation Age of Onset  . Cancer Father     bone  . Cancer Brother     lungs  . Stroke Maternal Grandmother   . Colon cancer Neg Hx   . Asthma Son     died at age 48 in his sleep   . Spina bifida Son     died at age 46    Family History Substance Abuse: Yes, Describe: Family Supports: Yes, List: (mom) Living Arrangements: Alone Can pt return to current living arrangement?: Yes Abuse/Neglect [facility]) Physical Abuse: Denies Verbal Abuse: Denies Sexual Abuse: Denies Allergies:   Allergies  Allergen Reactions  . Bee Venom Anaphylaxis  . Penicillins Rash    ACT  Assessment Complete:  No:   Past Psychiatric History: Diagnosis:  Major depression; alcohol dependence  Hospitalizations:  Yes  Outpatient Care:  [facility]  Substance Abuse Care:  Rehabs and detoxs in the past  Self-Mutilation:  None  Suicidal Attempts:  Once before by cutting his wrist  Homicidal Behaviors:  No   Violent Behaviors:  No   Place of Residence:  [place] Marital Status:  Single Employed/Unemployed:  Unemployed Education:  High school Family Supports:  Sister, mother Objective: Blood pressure 126/67, pulse 84, temperature 99.2 F (37.3 C), temperature source Oral, resp. rate 19, height _0  (1.651 m), weight 156 lb 4.9 oz (70.9 kg), SpO2 95 %.Body mass index is 26.01 kg/(m^2). Results for orders placed or performed during the hospital encounter of 07/20/14 (from the past 72 hour(s))  CBC     Status: Abnormal   Collection Time: 07/24/14  2:40 AM  Result Value Ref Range   WBC 10.0 4.0 - 10.5 K/uL   RBC 3.71 (L) 4.22 - 5.81 MIL/uL   Hemoglobin 11.1 (L) 13.0 - 17.0 g/dL   HCT 33.3 (L) 39.0 - 52.0 %   MCV 89.8 78.0 - 100.0 fL   MCH 29.9 26.0 - 34.0 pg   MCHC 33.3 30.0 - 36.0 g/dL   RDW 14.9 11.5 - 15.5 %   Platelets 445 (H) 150 - 400 K/uL  Basic metabolic panel     Status: Abnormal   Collection Time: 07/24/14  2:40 AM  Result Value Ref Range   Sodium 132 (L) 135 - 145 mmol/L    Comment: Please note change in reference range.   Potassium 4.0 3.5 - 5.1 mmol/L    Comment: Please note change in reference range.   Chloride 95 (L) 96 - 112 mEq/L   CO2 29 19 - 32 mmol/L   Glucose, Bld 104 (H) 70 - 99 mg/dL   BUN 5 (L) 6 - 23 mg/dL   Creatinine, Ser 0.70 0.50 - 1.35 mg/dL   Calcium 8.6 8.4 - 10.5 mg/dL   GFR calc non Af Amer >90 >90 mL/min   GFR calc Af Amer >90 >90 mL/min    Comment: (NOTE) The eGFR has been calculated using the CKD EPI equation. This calculation has not been validated in all clinical situations. eGFR's persistently <90 mL/min signify possible  Chronic Kidney Disease.    Anion gap 8 5 - 15  Magnesium     Status: None   Collection Time: 07/24/14  2:40 AM  Result Value Ref Range   Magnesium 2.0 1.5 - 2.5 mg/dL  Phosphorus     Status: None   Collection Time: 07/24/14  2:40 AM  Result Value Ref Range  Phosphorus 3.2 2.3 - 4.6 mg/dL  Comprehensive metabolic panel     Status: Abnormal   Collection Time: 07/25/14  4:01 AM  Result Value Ref Range   Sodium 129 (L) 135 - 145 mmol/L    Comment: Please note change in reference range.   Potassium 4.2 3.5 - 5.1 mmol/L    Comment: Please note change in reference range.   Chloride 97 96 - 112 mEq/L   CO2 23 19 - 32 mmol/L   Glucose, Bld 102 (H) 70 - 99 mg/dL   BUN 10 6 - 23 mg/dL   Creatinine, Ser 0.84 0.50 - 1.35 mg/dL   Calcium 8.4 8.4 - 10.5 mg/dL   Total Protein 6.1 6.0 - 8.3 g/dL   Albumin 2.5 (L) 3.5 - 5.2 g/dL   AST 13 0 - 37 U/L   ALT 11 0 - 53 U/L   Alkaline Phosphatase 80 39 - 117 U/L   Total Bilirubin 0.1 (L) 0.3 - 1.2 mg/dL   GFR calc non Af Amer >90 >90 mL/min   GFR calc Af Amer >90 >90 mL/min    Comment: (NOTE) The eGFR has been calculated using the CKD EPI equation. This calculation has not been validated in all clinical situations. eGFR's persistently <90 mL/min signify possible Chronic Kidney Disease.    Anion gap 9 5 - 15  CBC     Status: Abnormal   Collection Time: 07/25/14  4:01 AM  Result Value Ref Range   WBC 13.5 (H) 4.0 - 10.5 K/uL   RBC 3.93 (L) 4.22 - 5.81 MIL/uL   Hemoglobin 11.3 (L) 13.0 - 17.0 g/dL   HCT 35.2 (L) 39.0 - 52.0 %   MCV 89.6 78.0 - 100.0 fL   MCH 28.8 26.0 - 34.0 pg   MCHC 32.1 30.0 - 36.0 g/dL   RDW 15.0 11.5 - 15.5 %   Platelets 408 (H) 150 - 400 K/uL  Osmolality, urine     Status: None   Collection Time: 07/26/14  6:04 PM  Result Value Ref Range   Osmolality, Ur 418 390 - 1090 mOsm/kg    Comment: Performed at [facility] are reviewed and are pertinent for medical issues being addressed.  Current  Facility-Administered Medications  Medication Dose Route Frequency Provider Last Rate Last Dose  . 0.9 %  sodium chloride infusion   Intravenous Continuous Debbe Odea, MD 100 mL/hr at 07/26/14 0733    . acetaminophen (TYLENOL) tablet 650 mg  650 mg Oral Q6H PRN Rise Patience, MD   650 mg at 07/26/14 0751  . famotidine (PEPCID) tablet 20 mg  20 mg Oral BID Debbe Odea, MD   20 mg at 07/26/14 2156  . folic acid (FOLVITE) tablet 1 mg  1 mg Oral Daily Chesley Mires, MD   1 mg at 07/26/14 1112  . ipratropium-albuterol (DUONEB) 0.5-2.5 (3) MG/3ML nebulizer solution 3 mL  3 mL Nebulization Q2H PRN Chesley Mires, MD   3 mL at 07/25/14 2334  . LORazepam (ATIVAN) injection 1-4 mg  1-4 mg Intravenous Q4H PRN Debbe Odea, MD   2 mg at 07/26/14 2019  . nicotine (NICODERM CQ - dosed in mg/24 hours) patch 21 mg  21 mg Transdermal Daily Ripudeep K Rai, MD   21 mg at 07/26/14 1112  . nystatin (MYCOSTATIN) 100000 UNIT/ML suspension 500,000 Units  5 mL Oral QID Debbe Odea, MD   500,000 Units at 07/26/14 2156  . ondansetron (ZOFRAN) injection 4 mg  4 mg Intravenous Q6H PRN Rise Patience, MD   4 mg at 07/24/14 2038  . pantoprazole (PROTONIX) EC tablet 40 mg  40 mg Oral Q1200 Chesley Mires, MD   40 mg at 07/26/14 1751  . thiamine (VITAMIN B-1) tablet 100 mg  100 mg Oral Daily Chesley Mires, MD   100 mg at 07/26/14 1112    Psychiatric Specialty Exam:     Blood pressure 126/67, pulse 84, temperature 99.2 F (37.3 C), temperature source Oral, resp. rate 19, height _0  (1.651 m), weight 156 lb 4.9 oz (70.9 kg), SpO2 95 %.Body mass index is 26.01 kg/(m^2).  General Appearance: Casual  Eye Contact::  Good  Speech:  Normal Rate  Volume:  Normal  Mood:  Depressed  Affect:  Congruent  Thought Process:  Coherent  Orientation:  Full (Time, Place, and Person)  Thought Content:  WDL  Suicidal Thoughts:  No  Homicidal Thoughts:  No  Memory:  Good  Judgement:  Fair  Insight:  Fair  Psychomotor Activity:   Decreased  Concentration:  Good  Recall:  Good  Fund of Knowledge Good  Language: Good  Akathisia:  No  Handed:  Right  AIMS (if indicated):     Assets:  Leisure Time Resilience, family support, therapist  Sleep:      Musculoskeletal: Strength & Muscle Tone: within normal limits Gait & Station: normal Patient leans: N/A  Treatment Plan Summary: Discharge home to his mother, where he lives, with follow-up with his regular providers at [place] and [place] for sobriety, obtain a sponsor (already affiliated with a group).  Dr. Sabra Reyes assessed the patient and concurs with the plan.  Samuel Reyes, PMH-NP 07/27/2014 1:40 AM  I have been consulted about this patient and agree with the assessment and plan Samuel Reyes.

## 2014-07-27 NOTE — Progress Notes (Signed)
Patient discharge teaching given, including activity, diet, follow-up appoints, and medications. Patient verbalized understanding of all discharge instructions. IV access was d/c'd. Vitals are stable. Skin is intact except as charted in most recent assessments. Pt to be escorted out by NT, to be driven home by family. 

## 2014-07-27 NOTE — Progress Notes (Signed)
Patient asked for something for anxiety- RN left to go get medication, patient was asleep by the time nurse returned.

## 2014-07-27 NOTE — Progress Notes (Addendum)
CARE MANAGEMENT NOTE 07/27/2014  Patient:  Samuel Reyes, Samuel Reyes   Account Number:  [id number]  Date Initiated:  07/21/2014  Documentation initiated by:  Elissa Hefty  Subjective/Objective Assessment:   adm w ch pain, overdose, inc wbc     Action/Plan:   lives alone, pcp dr Annitta Needs   Anticipated DC Date:  07/27/2014   Anticipated DC Plan:  HOME/SELF CARE  In-house referral  Clinical Social Worker      DC Forensic scientist  CM consult  Outpatient Services - Pt will follow up      Choice offered to / List presented to:             Status of service:  Completed, signed off Medicare Important Message given?   (If response is "NO", the following Medicare IM given date fields will be blank) Date Medicare IM given:   Medicare IM given by:   Date Additional Medicare IM given:   Additional Medicare IM given by:    Discharge Disposition:  HOME/SELF CARE  Per UR Regulation:  Reviewed for med. necessity/level of care/duration of stay  If discussed at [place] of Stay Meetings, dates discussed:    Comments:  07/27/2014 1200 NCM spoke to pt and states holidays  are difficult for him. His mother is going to allow him to come stay with her post dc with the stipulation that he does not drink. Pt states he goes to the [employer] at [employer] for [employer]. They have meetings Mon-Fri, 3x per day. He has an appt with [facility] on 08/12/2014 at 2 pm. NCM contacted [facility] to confirm and they requested dc summary. Pt agreed to dc summary faxed to clinic. Faxed dc summary to [facility] # [phone]. Pt states he has appt with his other therapist, Dr Derwood Kaplan in March. NCM explained the importance of follow up post dc and compliance with treatment. He states his goal is to get better, he states he wants "a Job experience". Jonnie Finner RN CCM Case Mgmt phone [phone]

## 2014-08-01 ENCOUNTER — Encounter (HOSPITAL_COMMUNITY): Payer: Self-pay | Admitting: *Deleted

## 2014-08-01 ENCOUNTER — Inpatient Hospital Stay (HOSPITAL_COMMUNITY)
Admission: AD | Admit: 2014-08-01 | Discharge: 2014-08-09 | DRG: 885 | Disposition: A | Discharge status: Rehabilitation Facility | Payer: Medicaid Other | Source: Intra-hospital | Attending: Psychiatry | Admitting: Psychiatry

## 2014-08-01 ENCOUNTER — Emergency Department (HOSPITAL_COMMUNITY)
Admission: EM | Admit: 2014-08-01 | Discharge: 2014-08-01 | Disposition: A | Discharge status: Home / Self Care | Payer: Medicaid Other | Attending: Emergency Medicine | Admitting: Emergency Medicine

## 2014-08-01 DIAGNOSIS — I10 Essential (primary) hypertension: Secondary | ICD-10-CM | POA: Insufficient documentation

## 2014-08-01 DIAGNOSIS — F10230 Alcohol dependence with withdrawal, uncomplicated: Secondary | ICD-10-CM | POA: Diagnosis not present

## 2014-08-01 DIAGNOSIS — R0789 Other chest pain: Secondary | ICD-10-CM | POA: Diagnosis not present

## 2014-08-01 DIAGNOSIS — Z634 Disappearance and death of family member: Secondary | ICD-10-CM

## 2014-08-01 DIAGNOSIS — K219 Gastro-esophageal reflux disease without esophagitis: Secondary | ICD-10-CM | POA: Diagnosis not present

## 2014-08-01 DIAGNOSIS — Z880 Allergy status to penicillin: Secondary | ICD-10-CM | POA: Diagnosis not present

## 2014-08-01 DIAGNOSIS — Z599 Problem related to housing and economic circumstances, unspecified: Secondary | ICD-10-CM

## 2014-08-01 DIAGNOSIS — Z79899 Other long term (current) drug therapy: Secondary | ICD-10-CM | POA: Diagnosis not present

## 2014-08-01 DIAGNOSIS — F4310 Post-traumatic stress disorder, unspecified: Secondary | ICD-10-CM | POA: Diagnosis present

## 2014-08-01 DIAGNOSIS — J449 Chronic obstructive pulmonary disease, unspecified: Secondary | ICD-10-CM | POA: Insufficient documentation

## 2014-08-01 DIAGNOSIS — G4700 Insomnia, unspecified: Secondary | ICD-10-CM | POA: Diagnosis present

## 2014-08-01 DIAGNOSIS — Z823 Family history of stroke: Secondary | ICD-10-CM

## 2014-08-01 DIAGNOSIS — F332 Major depressive disorder, recurrent severe without psychotic features: Principal | ICD-10-CM | POA: Diagnosis present

## 2014-08-01 DIAGNOSIS — R45851 Suicidal ideations: Secondary | ICD-10-CM | POA: Diagnosis present

## 2014-08-01 DIAGNOSIS — Z8673 Personal history of transient ischemic attack (TIA), and cerebral infarction without residual deficits: Secondary | ICD-10-CM | POA: Diagnosis not present

## 2014-08-01 DIAGNOSIS — F1994 Other psychoactive substance use, unspecified with psychoactive substance-induced mood disorder: Secondary | ICD-10-CM

## 2014-08-01 DIAGNOSIS — F17210 Nicotine dependence, cigarettes, uncomplicated: Secondary | ICD-10-CM | POA: Diagnosis present

## 2014-08-01 DIAGNOSIS — Z609 Problem related to social environment, unspecified: Secondary | ICD-10-CM | POA: Diagnosis present

## 2014-08-01 DIAGNOSIS — F10239 Alcohol dependence with withdrawal, unspecified: Secondary | ICD-10-CM | POA: Diagnosis present

## 2014-08-01 DIAGNOSIS — G8929 Other chronic pain: Secondary | ICD-10-CM | POA: Diagnosis present

## 2014-08-01 DIAGNOSIS — M1990 Unspecified osteoarthritis, unspecified site: Secondary | ICD-10-CM | POA: Diagnosis present

## 2014-08-01 DIAGNOSIS — F410 Panic disorder [episodic paroxysmal anxiety] without agoraphobia: Secondary | ICD-10-CM | POA: Diagnosis present

## 2014-08-01 DIAGNOSIS — F329 Major depressive disorder, single episode, unspecified: Secondary | ICD-10-CM | POA: Insufficient documentation

## 2014-08-01 DIAGNOSIS — Z825 Family history of asthma and other chronic lower respiratory diseases: Secondary | ICD-10-CM

## 2014-08-01 DIAGNOSIS — R079 Chest pain, unspecified: Secondary | ICD-10-CM

## 2014-08-01 DIAGNOSIS — F1310 Sedative, hypnotic or anxiolytic abuse, uncomplicated: Secondary | ICD-10-CM | POA: Insufficient documentation

## 2014-08-01 DIAGNOSIS — Z720 Tobacco use: Secondary | ICD-10-CM | POA: Diagnosis not present

## 2014-08-01 DIAGNOSIS — Z8739 Personal history of other diseases of the musculoskeletal system and connective tissue: Secondary | ICD-10-CM | POA: Diagnosis not present

## 2014-08-01 DIAGNOSIS — F1010 Alcohol abuse, uncomplicated: Secondary | ICD-10-CM

## 2014-08-01 DIAGNOSIS — K7460 Unspecified cirrhosis of liver: Secondary | ICD-10-CM | POA: Diagnosis present

## 2014-08-01 DIAGNOSIS — J45909 Unspecified asthma, uncomplicated: Secondary | ICD-10-CM | POA: Diagnosis present

## 2014-08-01 LAB — BASIC METABOLIC PANEL
Anion gap: 14 (ref 5–15)
BUN: 22 mg/dL (ref 6–23)
CO2: 20 mmol/L (ref 19–32)
Calcium: 9.4 mg/dL (ref 8.4–10.5)
Chloride: 98 mEq/L (ref 96–112)
Creatinine, Ser: 1.27 mg/dL (ref 0.50–1.35)
GFR calc Af Amer: 70 mL/min — ABNORMAL LOW (ref >90)
GFR calc non Af Amer: 60 mL/min — ABNORMAL LOW (ref >90)
Glucose, Bld: 110 mg/dL — ABNORMAL HIGH (ref 70–99)
Potassium: 3.8 mmol/L (ref 3.5–5.1)
Sodium: 132 mmol/L — ABNORMAL LOW (ref 135–145)

## 2014-08-01 LAB — URINALYSIS, ROUTINE W REFLEX MICROSCOPIC
Glucose, UA: NEGATIVE mg/dL
Hgb urine dipstick: NEGATIVE
Ketones, ur: 40 mg/dL — AB
Leukocytes, UA: NEGATIVE
Nitrite: NEGATIVE
Protein, ur: NEGATIVE mg/dL
Specific Gravity, Urine: 1.031 — ABNORMAL HIGH (ref 1.005–1.030)
Urobilinogen, UA: 0.2 mg/dL (ref 0.0–1.0)
pH: 5.5 (ref 5.0–8.0)

## 2014-08-01 LAB — CBC WITH DIFFERENTIAL/PLATELET
Basophils Absolute: 0 10*3/uL (ref 0.0–0.1)
Basophils Relative: 0 % (ref 0–1)
Eosinophils Absolute: 0 10*3/uL (ref 0.0–0.7)
Eosinophils Relative: 0 % (ref 0–5)
HCT: 39.4 % (ref 39.0–52.0)
Hemoglobin: 12.7 g/dL — ABNORMAL LOW (ref 13.0–17.0)
Lymphocytes Relative: 11 % — ABNORMAL LOW (ref 12–46)
Lymphs Abs: 1.1 10*3/uL (ref 0.7–4.0)
MCH: 29.4 pg (ref 26.0–34.0)
MCHC: 32.2 g/dL (ref 30.0–36.0)
MCV: 91.2 fL (ref 78.0–100.0)
Monocytes Absolute: 1.1 10*3/uL — ABNORMAL HIGH (ref 0.1–1.0)
Monocytes Relative: 12 % (ref 3–12)
Neutro Abs: 7.1 10*3/uL (ref 1.7–7.7)
Neutrophils Relative %: 77 % (ref 43–77)
Platelets: 720 10*3/uL — ABNORMAL HIGH (ref 150–400)
RBC: 4.32 MIL/uL (ref 4.22–5.81)
RDW: 15.6 % — ABNORMAL HIGH (ref 11.5–15.5)
WBC: 9.3 10*3/uL (ref 4.0–10.5)

## 2014-08-01 LAB — RAPID URINE DRUG SCREEN, HOSP PERFORMED
Amphetamines: NOT DETECTED
Barbiturates: NOT DETECTED
Benzodiazepines: POSITIVE — AB
Cocaine: NOT DETECTED
Opiates: NOT DETECTED
Tetrahydrocannabinol: NOT DETECTED

## 2014-08-01 LAB — HEPATIC FUNCTION PANEL
ALBUMIN: 4.1 g/dL (ref 3.5–5.2)
ALT: 15 U/L (ref 0–53)
AST: 19 U/L (ref 0–37)
Alkaline Phosphatase: 78 U/L (ref 39–117)
Bilirubin, Direct: <0.1 mg/dL (ref 0.0–0.3)
TOTAL PROTEIN: 7.8 g/dL (ref 6.0–8.3)
Total Bilirubin: 2.6 mg/dL — ABNORMAL HIGH (ref 0.3–1.2)

## 2014-08-01 LAB — TROPONIN I: Troponin I: <0.03 ng/mL (ref <0.031)

## 2014-08-01 MED ORDER — LORAZEPAM 1 MG PO TABS
1.0000 mg | ORAL_TABLET | Freq: Every day | ORAL | Status: AC
  Administered 2014-08-05: 1 mg via ORAL
  Filled 2014-08-01: qty 1

## 2014-08-01 MED ORDER — ONDANSETRON 4 MG PO TBDP
4.0000 mg | ORAL_TABLET | Freq: Four times a day (QID) | ORAL | Status: AC | PRN
Start: 2014-08-01 — End: 2014-08-04

## 2014-08-01 MED ORDER — FLUOXETINE HCL 20 MG PO CAPS: 80.0000 mg | ORAL_CAPSULE | Freq: Every day | ORAL | Status: DC

## 2014-08-01 MED ORDER — VITAMIN B-1 100 MG PO TABS
100.0000 mg | ORAL_TABLET | Freq: Every day | ORAL | Status: DC
  Administered 2014-08-02 – 2014-08-08 (×7): 100 mg via ORAL
  Filled 2014-08-01 (×10): qty 1

## 2014-08-01 MED ORDER — ADULT MULTIVITAMIN W/MINERALS CH
1.0000 | ORAL_TABLET | Freq: Every day | ORAL | Status: DC
  Administered 2014-08-01 – 2014-08-08 (×8): 1 via ORAL
  Filled 2014-08-01 (×11): qty 1

## 2014-08-01 MED ORDER — FAMOTIDINE 20 MG PO TABS
20.0000 mg | ORAL_TABLET | Freq: Two times a day (BID) | ORAL | Status: DC
  Administered 2014-08-01: 20 mg via ORAL
  Filled 2014-08-01: qty 1

## 2014-08-01 MED ORDER — MORPHINE SULFATE 4 MG/ML IJ SOLN
4.0000 mg | Freq: Once | INTRAMUSCULAR | Status: AC
Start: 2014-08-01 — End: 2014-08-01
  Administered 2014-08-01: 4 mg via INTRAVENOUS
  Filled 2014-08-01: qty 1

## 2014-08-01 MED ORDER — ARIPIPRAZOLE 5 MG PO TABS
5.0000 mg | ORAL_TABLET | Freq: Every day | ORAL | Status: DC
  Administered 2014-08-01: 5 mg via ORAL
  Filled 2014-08-01: qty 1

## 2014-08-01 MED ORDER — GABAPENTIN 300 MG PO CAPS
ORAL_CAPSULE | ORAL | Status: AC
  Administered 2014-08-01: 300 mg via ORAL
  Filled 2014-08-01: qty 1

## 2014-08-01 MED ORDER — LORAZEPAM 1 MG PO TABS: 0.0000 mg | ORAL_TABLET | Freq: Two times a day (BID) | ORAL | Status: DC

## 2014-08-01 MED ORDER — ONDANSETRON HCL 4 MG PO TABS: 4.0000 mg | ORAL_TABLET | Freq: Three times a day (TID) | ORAL | Status: DC | PRN

## 2014-08-01 MED ORDER — NAPROXEN 500 MG PO TABS
500.0000 mg | ORAL_TABLET | Freq: Two times a day (BID) | ORAL | Status: DC
  Administered 2014-08-01 – 2014-08-08 (×15): 500 mg via ORAL
  Filled 2014-08-01 (×12): qty 1
  Filled 2014-08-01: qty 28
  Filled 2014-08-01 (×4): qty 1
  Filled 2014-08-01: qty 28
  Filled 2014-08-01 (×2): qty 1

## 2014-08-01 MED ORDER — ARIPIPRAZOLE 5 MG PO TABS
5.0000 mg | ORAL_TABLET | Freq: Every day | ORAL | Status: DC
  Administered 2014-08-02 – 2014-08-08 (×7): 5 mg via ORAL
  Filled 2014-08-01 (×10): qty 1
  Filled 2014-08-01: qty 14

## 2014-08-01 MED ORDER — METOPROLOL TARTRATE 25 MG PO TABS
25.0000 mg | ORAL_TABLET | Freq: Two times a day (BID) | ORAL | Status: DC
  Administered 2014-08-01: 25 mg via ORAL
  Filled 2014-08-01: qty 1

## 2014-08-01 MED ORDER — FAMOTIDINE 20 MG PO TABS
ORAL_TABLET | ORAL | Status: AC
  Administered 2014-08-01: 16:00:00
  Filled 2014-08-01: qty 1

## 2014-08-01 MED ORDER — LORAZEPAM 1 MG PO TABS
1.0000 mg | ORAL_TABLET | Freq: Four times a day (QID) | ORAL | Status: AC
  Administered 2014-08-01 – 2014-08-02 (×3): 1 mg via ORAL
  Filled 2014-08-01 (×2): qty 1

## 2014-08-01 MED ORDER — HYDROXYZINE HCL 25 MG PO TABS
ORAL_TABLET | ORAL | Status: AC
  Administered 2014-08-01: 25 mg via ORAL
  Filled 2014-08-01: qty 1

## 2014-08-01 MED ORDER — LORAZEPAM 1 MG PO TABS: 1.0000 mg | ORAL_TABLET | Freq: Four times a day (QID) | ORAL | Status: AC | PRN

## 2014-08-01 MED ORDER — IPRATROPIUM-ALBUTEROL 18-103 MCG/ACT IN AERO: 2.0000 | INHALATION_SPRAY | Freq: Two times a day (BID) | RESPIRATORY_TRACT | Status: DC

## 2014-08-01 MED ORDER — NICOTINE 21 MG/24HR TD PT24
21.0000 mg | MEDICATED_PATCH | Freq: Every day | TRANSDERMAL | Status: DC
  Administered 2014-08-02 – 2014-08-08 (×7): 21 mg via TRANSDERMAL
  Filled 2014-08-01 (×8): qty 1
  Filled 2014-08-01: qty 14
  Filled 2014-08-01 (×2): qty 1

## 2014-08-01 MED ORDER — FOLIC ACID 1 MG PO TABS
1.0000 mg | ORAL_TABLET | Freq: Every day | ORAL | Status: DC
  Administered 2014-08-01: 1 mg via ORAL
  Filled 2014-08-01: qty 1

## 2014-08-01 MED ORDER — LORAZEPAM 1 MG PO TABS
1.0000 mg | ORAL_TABLET | Freq: Two times a day (BID) | ORAL | Status: AC
  Administered 2014-08-03 – 2014-08-04 (×2): 1 mg via ORAL
  Filled 2014-08-01 (×2): qty 1

## 2014-08-01 MED ORDER — LORAZEPAM 1 MG PO TABS
1.0000 mg | ORAL_TABLET | Freq: Three times a day (TID) | ORAL | Status: AC
  Administered 2014-08-02 – 2014-08-03 (×3): 1 mg via ORAL
  Filled 2014-08-01 (×3): qty 1

## 2014-08-01 MED ORDER — LORAZEPAM 1 MG PO TABS: 0.0000 mg | ORAL_TABLET | Freq: Four times a day (QID) | ORAL | Status: DC

## 2014-08-01 MED ORDER — MAGNESIUM HYDROXIDE 400 MG/5ML PO SUSP: 30.0000 mL | Freq: Every day | ORAL | Status: DC | PRN

## 2014-08-01 MED ORDER — DIAZEPAM 5 MG/ML IJ SOLN
5.0000 mg | Freq: Once | INTRAMUSCULAR | Status: AC
  Administered 2014-08-01: 5 mg via INTRAVENOUS
  Filled 2014-08-01: qty 2

## 2014-08-01 MED ORDER — NAPROXEN 500 MG PO TABS: 500.0000 mg | ORAL_TABLET | Freq: Two times a day (BID) | ORAL | Status: DC

## 2014-08-01 MED ORDER — ACETAMINOPHEN 325 MG PO TABS
650.0000 mg | ORAL_TABLET | Freq: Four times a day (QID) | ORAL | Status: DC | PRN
  Administered 2014-08-05 – 2014-08-08 (×8): 650 mg via ORAL
  Filled 2014-08-01 (×10): qty 2

## 2014-08-01 MED ORDER — NAPROXEN 500 MG PO TABS
ORAL_TABLET | ORAL | Status: AC
  Administered 2014-08-01: 500 mg via ORAL
  Filled 2014-08-01: qty 1

## 2014-08-01 MED ORDER — SODIUM CHLORIDE 0.9 % IV BOLUS (SEPSIS)
1000.0000 mL | Freq: Once | INTRAVENOUS | Status: AC
  Administered 2014-08-01: 1000 mL via INTRAVENOUS

## 2014-08-01 MED ORDER — ALUM & MAG HYDROXIDE-SIMETH 200-200-20 MG/5ML PO SUSP: 30.0000 mL | ORAL | Status: DC | PRN

## 2014-08-01 MED ORDER — HYDROXYZINE HCL 25 MG PO TABS
25.0000 mg | ORAL_TABLET | Freq: Four times a day (QID) | ORAL | Status: AC | PRN
  Administered 2014-08-01: 25 mg via ORAL

## 2014-08-01 MED ORDER — FAMOTIDINE 20 MG PO TABS
20.0000 mg | ORAL_TABLET | Freq: Two times a day (BID) | ORAL | Status: DC
  Administered 2014-08-01 – 2014-08-08 (×15): 20 mg via ORAL
  Filled 2014-08-01: qty 28
  Filled 2014-08-01 (×9): qty 1
  Filled 2014-08-01: qty 28
  Filled 2014-08-01 (×9): qty 1

## 2014-08-01 MED ORDER — LORAZEPAM 1 MG PO TABS
ORAL_TABLET | ORAL | Status: AC
  Administered 2014-08-01: 1 mg via ORAL
  Filled 2014-08-01: qty 1

## 2014-08-01 MED ORDER — NICOTINE 21 MG/24HR TD PT24
21.0000 mg | MEDICATED_PATCH | Freq: Every day | TRANSDERMAL | Status: DC
  Administered 2014-08-01: 21 mg via TRANSDERMAL
  Filled 2014-08-01: qty 1

## 2014-08-01 MED ORDER — PANTOPRAZOLE SODIUM 40 MG PO TBEC
80.0000 mg | DELAYED_RELEASE_TABLET | Freq: Every day | ORAL | Status: DC
  Administered 2014-08-01: 80 mg via ORAL
  Filled 2014-08-01: qty 2

## 2014-08-01 MED ORDER — THIAMINE HCL 100 MG/ML IJ SOLN: 100.0000 mg | Freq: Every day | INTRAMUSCULAR | Status: DC

## 2014-08-01 MED ORDER — FLUOXETINE HCL 20 MG PO CAPS
80.0000 mg | ORAL_CAPSULE | Freq: Every day | ORAL | Status: DC
  Administered 2014-08-01 – 2014-08-08 (×8): 80 mg via ORAL
  Filled 2014-08-01 (×5): qty 4
  Filled 2014-08-01: qty 56
  Filled 2014-08-01 (×5): qty 4
  Filled 2014-08-01: qty 3
  Filled 2014-08-01: qty 4

## 2014-08-01 MED ORDER — IPRATROPIUM-ALBUTEROL 18-103 MCG/ACT IN AERO
2.0000 | INHALATION_SPRAY | Freq: Two times a day (BID) | RESPIRATORY_TRACT | Status: DC
  Administered 2014-08-01 – 2014-08-02 (×2): 2 via RESPIRATORY_TRACT
  Filled 2014-08-01 (×3): qty 14.7

## 2014-08-01 MED ORDER — METOPROLOL TARTRATE 25 MG PO TABS
ORAL_TABLET | ORAL | Status: AC
  Administered 2014-08-01: 25 mg via ORAL
  Filled 2014-08-01: qty 1

## 2014-08-01 MED ORDER — GABAPENTIN 300 MG PO CAPS
300.0000 mg | ORAL_CAPSULE | Freq: Two times a day (BID) | ORAL | Status: DC
  Administered 2014-08-01 – 2014-08-08 (×15): 300 mg via ORAL
  Filled 2014-08-01 (×17): qty 1
  Filled 2014-08-01 (×2): qty 28

## 2014-08-01 MED ORDER — FLUOXETINE HCL 20 MG PO CAPS
ORAL_CAPSULE | ORAL | Status: AC
  Administered 2014-08-01: 80 mg via ORAL
  Filled 2014-08-01: qty 4

## 2014-08-01 MED ORDER — TRAZODONE HCL 150 MG PO TABS
250.0000 mg | ORAL_TABLET | Freq: Every day | ORAL | Status: DC
  Administered 2014-08-01 – 2014-08-03 (×3): 250 mg via ORAL
  Filled 2014-08-01 (×8): qty 1

## 2014-08-01 MED ORDER — METOPROLOL TARTRATE 25 MG PO TABS
25.0000 mg | ORAL_TABLET | Freq: Two times a day (BID) | ORAL | Status: DC
  Administered 2014-08-01 – 2014-08-08 (×14): 25 mg via ORAL
  Filled 2014-08-01 (×10): qty 1
  Filled 2014-08-01: qty 28
  Filled 2014-08-01 (×3): qty 1
  Filled 2014-08-01: qty 28
  Filled 2014-08-01 (×5): qty 1

## 2014-08-01 MED ORDER — THIAMINE HCL 100 MG/ML IJ SOLN: 100.0000 mg | Freq: Once | INTRAMUSCULAR | Status: DC

## 2014-08-01 MED ORDER — VITAMIN B-1 100 MG PO TABS
100.0000 mg | ORAL_TABLET | Freq: Every day | ORAL | Status: DC
  Administered 2014-08-01: 100 mg via ORAL
  Filled 2014-08-01: qty 1

## 2014-08-01 MED ORDER — TRAZODONE HCL 50 MG PO TABS: 250.0000 mg | ORAL_TABLET | Freq: Every day | ORAL | Status: DC

## 2014-08-01 MED ORDER — ALBUTEROL SULFATE HFA 108 (90 BASE) MCG/ACT IN AERS: 2.0000 | INHALATION_SPRAY | Freq: Four times a day (QID) | RESPIRATORY_TRACT | Status: DC | PRN

## 2014-08-01 MED ORDER — LOPERAMIDE HCL 2 MG PO CAPS
2.0000 mg | ORAL_CAPSULE | ORAL | Status: AC | PRN
  Administered 2014-08-01: 4 mg via ORAL
  Administered 2014-08-02: 2 mg via ORAL
  Filled 2014-08-01: qty 2
  Filled 2014-08-01: qty 1

## 2014-08-01 MED ORDER — ADULT MULTIVITAMIN W/MINERALS CH
ORAL_TABLET | ORAL | Status: AC
  Administered 2014-08-01: 1 via ORAL
  Filled 2014-08-01: qty 1

## 2014-08-01 NOTE — ED Notes (Signed)
Patient states he has been in a hotel getting drunk all week. He had been sober for 2 months prior. He has buried his wife and 2 sons and became depressed. This what provoked the relapse. He states that last night he was smoking a cigarette when the chest pain began. This is not new, this has been occurring on and off for about 3 years. He became short of breath and nauseated at that time. He denies chest pain today.

## 2014-08-01 NOTE — Progress Notes (Signed)
Pt has been lying in bed watching TV, but mostly dozing off/on since the change of shift.  Pt reports he is having minimal to moderate withdrawal symptoms.  He has been pleasant/cooperative with Obs staff.  Pt makes his needs known to staff.  Pt encouraged to drink sufficient fluids.  Pt having passive suicidal thoughts at this time, but denies HI/AV.  Support and encouragement offered.  Safety maintained with q15 minute checks.

## 2014-08-01 NOTE — Progress Notes (Addendum)
Pt appears to be resting comfortably in his bed watching a football game. He is drinking gatorade and requested to take his meds early due to shakes and a really bad headache. Pt remains pleasant and cooperative. Pt was given 25mg  of visteral po for his shakes and nerves. He was also given his standing doses of naprosyn as well for a headache. Will continue to monitor pt closely. Pt given 2 immodium for diarrhea

## 2014-08-01 NOTE — Progress Notes (Signed)
Patient ID: Samuel Reyes, male   DOB: February 06, 1956, 59 y.o.   MRN: [id number] Pt is a 59 year old voluntary admit with allergies to PCN and Bee venom. He states he has been drinking times 40 years and has only had a 2 month period of sobriety. Pt is very pleasant and states he lost his wife 3 years ago to Chron's disease and a son 6 years ago to asthma. Pt stated a second son dies 40 years ago from complications from spina Bifada. He admits he has been drinking 2  Half gallon bottles of vodka a day along with an 18 pack of beer for the last two weeks due to extreme depression. He also has a hx of two SI attempts in the past. Pts hx includes: Cirrhosis, stroke, 27 surgeries (shoulder surgeries, stomach surgeries, knee surgery and back surgery), HTN, COPD. Pt stated he lives in a half way house and was told by the owner that he needed medical help before he can return. Pt does contract for safety and denies SI and HI,.

## 2014-08-01 NOTE — ED Notes (Signed)
Bed: JI96 Expected date: 08/01/14 Expected time: 8:28 AM Means of arrival: Ambulance Comments: Chest pain last night, detox

## 2014-08-01 NOTE — H&P (Signed)
[facility] OBS UNIT H&P  Subjective:  Pt seen and chart reviewed. Pt denies SI, HI, and AVH, contracts for safety. Pt reports having had 27 surgeries, suffering from chronic pain, and having trouble with local provider willingness to prescribe narcotic medications. He reports poor, but improving sleep when treated with Trazodone. Appetite is moderate. Pt reports chronic depression secondary to inability to get hired for work reportedly due to insurance cost and health risk to employers. Pt subjective/objective findings are congruent and he is optimistic about treatment with a preference for residential rehab for alcoholism. His last CIWA was 9 and he reports that the Ativan protocol appears to be effective at controlling his symptomology at this time.   HPI: Samuel Reyes is an 59 y.o. male who came into the Emergency Department after a friend suggested that he get help for his drinking. He states he has been drinking in a motel for the past couple of weeks straight and states he drinks about 2 half gallons of vodka and 7 18 packs of beer. Pt has a history of overdosing a "few weeks ago" on trazadone. He states that he just ended up throwing up. He denies this was necessarily a suicide attempt, he just wanted to "sleep". He does admit that he tried to kill himself by slitting his wrists 6 years ago when his son died, then his wife died 3 years later.    Pt states that he has chronic pain and just wants to "forget about the stressors in his life" so he drinks. He states that he was on methadone for his pain but was not able to get it any more when he moved from [place] to [place]. Pt states that the longest period of time he's been sober was a few weeks ago he was sober for 2 months at a halfway house. He would like to return there but needs to "get help" first according to the landlord before he is allowed to return. Pt has been using alcohol since he was 59 years old and his first son died at age 55. Pt states  that he has alienated himself from his family and friends and feels he is "85 and useless". Some passive SI statements were made however he denies suicidal thoughts at present, denies A/V hallucinations and HI. It is recommended pt go to observation unit at [facility] per Waylan Boga, NP.   Axis I: Alcohol Abuse, Major Depression, single episode and Substance Induced Mood Disorder Axis II: Deferred Axis III:  Past Medical History  Diagnosis Date  . Cirrhosis   . Degenerative joint disease   . Hypertension   . Gastric ulcer   . Depression   . Asthma   . Mitral valve prolapse 2002  . H/O hiatal hernia   . COPD (chronic obstructive pulmonary disease)   . TIA (transient ischemic attack)     2010  . Stroke     TIA - 2010 - no deficits   . Shortness of breath     with exertion   . GERD (gastroesophageal reflux disease)   . Headache(784.0)   . Hiatal hernia 1982  . Degenerative joint disease    Axis IV: economic problems, housing problems, other psychosocial or environmental problems and problems related to social environment Axis V: 41-50 serious symptoms  Psychiatric Specialty Exam: Physical Exam  Review of Systems  Constitutional: Negative.   HENT: Negative.   Eyes: Negative.   Respiratory: Negative.   Cardiovascular: Negative.   Gastrointestinal: Negative.  Genitourinary: Negative.   Musculoskeletal: Negative.   Skin: Negative.   Neurological: Negative.   Endo/Heme/Allergies: Negative.   Psychiatric/Behavioral: Positive for depression and substance abuse. The patient is nervous/anxious.     Blood pressure 142/82, pulse 71, temperature 98.7 F (37.1 C), height 5\' 4"  (1.626 m), weight 70.308 kg (155 lb), SpO2 100 %.Body mass index is 26.59 kg/(m^2).  General Appearance: Disheveled  Eye Contact::  Good  Speech:  Clear and Coherent and Normal Rate  Volume:  Normal  Mood:  Anxious and Depressed  Affect:  Depressed  Thought Process:  Coherent and Goal Directed  Orientation:   Full (Time, Place, and Person)  Thought Content:  WDL  Suicidal Thoughts:  No  Homicidal Thoughts:  No  Memory:  Immediate;   Fair Recent;   Fair Remote;   Fair  Judgement:  Fair  Insight:  Fair  Psychomotor Activity:  Normal  Concentration:  Good  Recall:  Good  Akathisia:  No  Handed:    AIMS (if indicated):     Assets:  Communication Skills Desire for Improvement Resilience  Sleep:         Past Medical History:  Past Medical History  Diagnosis Date  . Cirrhosis   . Degenerative joint disease   . Hypertension   . Gastric ulcer   . Depression   . Asthma   . Mitral valve prolapse 2002  . H/O hiatal hernia   . COPD (chronic obstructive pulmonary disease)   . TIA (transient ischemic attack)     2010  . Stroke     TIA - 2010 - no deficits   . Shortness of breath     with exertion   . GERD (gastroesophageal reflux disease)   . Headache(784.0)   . Hiatal hernia 1982  . Degenerative joint disease     Past Surgical History  Procedure Laterality Date  . Gastrectomy    . Shoulder surgery Bilateral     3 surgeries on on left, 2 surgeries on right   . Rt knee arthroscopic surgery    . Back surgery      3 cervical spine surgeries C4-C5 fused  . Hernia repair    . Finger surgery Left     2nd, 3rd, & 4th fingers were cut off by table saw and reattached  . Colonoscopy N/A 01/04/2014    Procedure: COLONOSCOPY;  Surgeon: Danie Binder, MD;  Location: [facility];  Service: Endoscopy;  Laterality: N/A;  1:45  . Esophagogastroduodenoscopy N/A 01/04/2014    Procedure: ESOPHAGOGASTRODUODENOSCOPY (EGD);  Surgeon: Danie Binder, MD;  Location: [facility];  Service: Endoscopy;  Laterality: N/A;  . Incisional hernia repair N/A 01/20/2014    Procedure: LAPAROSCOPIC RECURRENT  INCISIONAL HERNIA with mesh;  Surgeon: Edward Jolly, MD;  Location: [facility];  Service: General;  Laterality: N/A;    Family History:  Family History  Problem Relation Age of Onset  . Cancer  Father     bone  . Cancer Brother     lungs  . Stroke Maternal Grandmother   . Colon cancer Neg Hx   . Asthma Son     died at age 76 in his sleep   . Spina bifida Son     died at age 22     Social History:  reports that he has been smoking Cigarettes.  He started smoking about 48 years ago. He has a 45 pack-year smoking history. He has never used smokeless tobacco. He  reports that he drinks about 3.6 oz of alcohol per week. He reports that he does not use illicit drugs.  Additional Social History:  Alcohol / Drug Use History of alcohol / drug use?: Yes Negative Consequences of Use: Financial  CIWA: CIWA-Ar BP: (!) 142/82 mmHg Pulse Rate: 71 Nausea and Vomiting: no nausea and no vomiting Tactile Disturbances: none Tremor: three Auditory Disturbances: not present Paroxysmal Sweats: no sweat visible Visual Disturbances: not present Anxiety: two Headache, Fullness in Head: severe Agitation: normal activity Orientation and Clouding of Sensorium: oriented and can do serial additions CIWA-Ar Total: 10 COWS:    Allergies:  Allergies  Allergen Reactions  . Bee Venom Anaphylaxis  . Penicillins Rash    Home Medications:  Medications Prior to Admission  Medication Sig Dispense Refill  . acetaminophen (TYLENOL) 325 MG tablet Take 2 tablets (650 mg total) by mouth every 6 (six) hours as needed for mild pain, moderate pain or headache (or Fever >/= 101).    Marland Kitchen albuterol (PROVENTIL HFA;VENTOLIN HFA) 108 (90 BASE) MCG/ACT inhaler Inhale 2 puffs into the lungs every 6 (six) hours as needed for wheezing or shortness of breath. 1 Inhaler 5  . albuterol-ipratropium (COMBIVENT) 18-103 MCG/ACT inhaler Inhale 2 puffs into the lungs 2 (two) times daily. For shortness of breath 1 Inhaler 5  . ARIPiprazole (ABILIFY) 5 MG tablet Take 5 mg by mouth daily.    . famotidine (PEPCID) 20 MG tablet Take 1 tablet (20 mg total) by mouth 2 (two) times daily. 60 tablet 0  . FLUoxetine (PROZAC) 40 MG capsule  Take 80 mg by mouth daily.    . folic acid (FOLVITE) 1 MG tablet Take 1 tablet (1 mg total) by mouth daily. 30 tablet 0  . gabapentin (NEURONTIN) 300 MG capsule Take 1 capsule (300 mg total) by mouth 2 (two) times daily. For substance withdrawal syndrome 60 capsule 0  . metoprolol tartrate (LOPRESSOR) 25 MG tablet Take 1 tablet (25 mg total) by mouth 2 (two) times daily. For high blood pressure 60 tablet 3  . naproxen (NAPROSYN) 500 MG tablet Take 500 mg by mouth 2 (two) times daily with a meal.    . nicotine (NICODERM CQ - DOSED IN MG/24 HOURS) 21 mg/24hr patch Place 21 mg onto the skin daily.    Marland Kitchen omeprazole (PRILOSEC) 20 MG capsule Take 20 mg by mouth daily.  1  . thiamine 100 MG tablet Take 1 tablet (100 mg total) by mouth daily. 30 tablet 0  . traZODone (DESYREL) 100 MG tablet Take 250 mg by mouth at bedtime.      OB/GYN Status:  No LMP for male patient.  Disposition: Spend the night in the [facility] for Detoxification; Ativan protocol started. LFT's ordered as add-on to ED lab collection.     Benjamine Mola, FNP-BC 08/01/2014 3:16 PM  Patient seen, evaluated and I agree with notes by Nurse Practitioner. Corena Pilgrim, MD

## 2014-08-01 NOTE — ED Provider Notes (Signed)
CSN: [id number]     Arrival date & time 08/01/14  9518 History   First MD Initiated Contact with Patient 08/01/14 [phone]     Chief Complaint  Patient presents with  . Alcohol Problem  . Chest Pain  . Depression     (Consider location/radiation/quality/duration/timing/severity/associated sxs/prior Treatment) HPI   59yM with CP and requesting detox. Pt admitted to [facility] 02/2014, 03/2014 and to [facility] 12/22-12/29 and each time presented with CP and requesting detox. Patient states he has been living in a motel drinking ever since he was discharged. Spleen having chest pain for the last day and half. His been fairly constant. Achy in the center his chest. No acute respiratory complaints. No suicidal homicidal ideation. States feelings of hopelessness though. He has had 2 of his sons died. One had cerebral palsy died many years ago and the other within the past several years. He is also widowed. He thinks about his family constantly. He left [place] after their deaths. He came back to this area since he grew up in [place]. He has very little social support currently.  Past Medical History  Diagnosis Date  . Cirrhosis   . Degenerative joint disease   . Hypertension   . Gastric ulcer   . Depression   . Asthma   . Mitral valve prolapse 2002  . H/O hiatal hernia   . COPD (chronic obstructive pulmonary disease)   . TIA (transient ischemic attack)     2010  . Stroke     TIA - 2010 - no deficits   . Shortness of breath     with exertion   . GERD (gastroesophageal reflux disease)   . Headache(784.0)   . Hiatal hernia 1982  . Degenerative joint disease    Past Surgical History  Procedure Laterality Date  . Gastrectomy    . Shoulder surgery Bilateral     3 surgeries on on left, 2 surgeries on right   . Rt knee arthroscopic surgery    . Back surgery      3 cervical spine surgeries C4-C5 fused  . Hernia repair    . Finger surgery Left     2nd, 3rd, & 4th fingers were cut off by table saw  and reattached  . Colonoscopy N/A 01/04/2014    Procedure: COLONOSCOPY;  Surgeon: Danie Binder, MD;  [facility];  Service: Endoscopy;  Laterality: N/A;  1:45  . Esophagogastroduodenoscopy N/A 01/04/2014    Procedure: ESOPHAGOGASTRODUODENOSCOPY (EGD);  Surgeon: Danie Binder, MD;  Location: [facility];  Service: Endoscopy;  Laterality: N/A;  . Incisional hernia repair N/A 01/20/2014    Procedure: LAPAROSCOPIC RECURRENT  INCISIONAL HERNIA with mesh;  Surgeon: Edward Jolly, MD;  Location: [facility];  Service: General;  Laterality: N/A;   Family History  Problem Relation Age of Onset  . Cancer Father     bone  . Cancer Brother     lungs  . Stroke Maternal Grandmother   . Colon cancer Neg Hx   . Asthma Son     died at age 40 in his sleep   . Spina bifida Son     died at age 34    History  Substance Use Topics  . Smoking status: Current Every Day Smoker -- 1.00 packs/day for 45 years    Types: Cigarettes    Start date: 07/30/1966  . Smokeless tobacco: Never Used  . Alcohol Use: 3.6 oz/week    6 Cans of  beer per week     Comment: 1 case of beer and 1 shot of vodka between 1030am-1430pm    Review of Systems  All systems reviewed and negative, other than as noted in HPI.   Allergies  Bee venom and Penicillins  Home Medications   Prior to Admission medications   Medication Sig Start Date End Date Taking? Authorizing Provider  acetaminophen (TYLENOL) 325 MG tablet Take 2 tablets (650 mg total) by mouth every 6 (six) hours as needed for mild pain, moderate pain or headache (or Fever >/= 101). 07/27/14   Modena Jansky, MD  albuterol (PROVENTIL HFA;VENTOLIN HFA) 108 (90 BASE) MCG/ACT inhaler Inhale 2 puffs into the lungs every 6 (six) hours as needed for wheezing or shortness of breath. 03/12/14   Encarnacion Slates, NP  albuterol-ipratropium (COMBIVENT) 18-103 MCG/ACT inhaler Inhale 2 puffs into the lungs 2 (two) times daily. For shortness of breath 03/12/14   Encarnacion Slates, NP  famotidine (PEPCID) 20 MG tablet Take 1 tablet (20 mg total) by mouth 2 (two) times daily. 07/27/14   Modena Jansky, MD  folic acid (FOLVITE) 1 MG tablet Take 1 tablet (1 mg total) by mouth daily. 07/27/14   Modena Jansky, MD  gabapentin (NEURONTIN) 300 MG capsule Take 1 capsule (300 mg total) by mouth 2 (two) times daily. For substance withdrawal syndrome 03/12/14   Encarnacion Slates, NP  metoprolol tartrate (LOPRESSOR) 25 MG tablet Take 1 tablet (25 mg total) by mouth 2 (two) times daily. For high blood pressure 03/12/14   Encarnacion Slates, NP  nicotine (NICODERM CQ - DOSED IN MG/24 HOURS) 21 mg/24hr patch Place 21 mg onto the skin daily.    Historical Provider, MD  omeprazole (PRILOSEC) 20 MG capsule Take 20 mg by mouth daily. 05/29/14   Historical Provider, MD  thiamine 100 MG tablet Take 1 tablet (100 mg total) by mouth daily. 07/27/14   Modena Jansky, MD   BP 158/81 mmHg  Pulse 72  Resp 20  SpO2 100% Physical Exam  Constitutional: He is oriented to person, place, and time. He appears well-developed and well-nourished. No distress.  HENT:  Head: Normocephalic and atraumatic.  Eyes: Conjunctivae are normal. Right eye exhibits no discharge. Left eye exhibits no discharge.  Neck: Neck supple.  Cardiovascular: Normal rate, regular rhythm and normal heart sounds.  Exam reveals no gallop and no friction rub.   No murmur heard. Pulmonary/Chest: Effort normal and breath sounds normal. No respiratory distress. He exhibits no tenderness.  Abdominal: Soft. He exhibits no distension. There is no tenderness.  Musculoskeletal: He exhibits no edema or tenderness.  Neurological: He is alert and oriented to person, place, and time.  Skin: Skin is warm and dry.  Psychiatric: He has a normal mood and affect. His behavior is normal. Thought content normal.  Speech is clear. Content is appropriate. Does not appear to be responding to internal stimuli.  Nursing note and vitals reviewed.   ED  Course  Procedures (including critical care time) Labs Review Labs Reviewed - No data to display  Imaging Review No results found.   EKG Interpretation   Date/Time:  Sunday August 01 2014 08:36:36 EST Ventricular Rate:  70 PR Interval:  129 QRS Duration: 108 QT Interval:  451 QTC Calculation: 487 R Axis:   61 Text Interpretation:  Sinus rhythm Abnormal R-wave progression, early  transition Borderline prolonged QT interval ED PHYSICIAN INTERPRETATION  AVAILABLE IN [facility] Confirmed by TEST, Record (  [id number]) on 08/03/2014  8:01:27 AM      MDM   Final diagnoses:  None    59 year old male with alcohol abuse and chest pain. His chest pain is atypical. Doubt ACS, PE, infectious. TTS evaluation for possible placement for alcohol abuse/detox.   Virgel Manifold, MD 08/05/14 1447

## 2014-08-01 NOTE — ED Notes (Signed)
[facility]  CALLED  FOR  TRANSPORT   

## 2014-08-01 NOTE — B[facility] Counselor (Signed)
Support paperwork complete, pt accepted to Observation unit bed 5 by Debarah Crape [facility] and Nicholos Johns, NP.   Bedelia Person, M.S., LPCA, [facility] Licensed Professional Counselor Associate  Triage Specialist  [facility]  Therapeutic Triage Services Phone: [phone] Fax: [phone]

## 2014-08-01 NOTE — BH Assessment (Signed)
Assessment Note  Samuel Reyes is an 59 y.o. male who came into the Emergency Department after a friend suggested that he get help for his drinking. He states he has been drinking in a motel for the past couple of weeks straight and states he drinks about 2 half gallons of vodka and 7 18 packs of beer. Pt has a history of overdosing a "few weeks ago" on trazadone. He states that he just ended up throwing up. He denies this was necessarily a suicide attempt, he just wanted to "sleep". He does admit that he tried to kill himself by slitting his wrists 6 years ago when his son died, then his wife died 3 years later.   Pt states that he has chronic pain and just wants to "forget about the stressors in his life" so he drinks. He states that he was on methadone for his pain but was not able to get it any more when he moved from [place] to [place]. Pt states that the longest period of time he's been sober was a few weeks ago he was sober for 2 months at a halfway house. He would like to return there but needs to "get help" first according to the landlord before he is allowed to return. Pt has been using alcohol since he was 59 years old and his first son died at age 35. Pt states that he has alienated himself from his family and friends and feels he is "75 and useless". Some passive SI statements were made however he denies suicidal thoughts at present, denies A/V hallucinations and HI. It is recommended pt go to observation unit at [facility] per Waylan Boga, NP.   Axis I: Substance Induced Mood Disorder Axis II: Deferred Axis III:  Past Medical History  Diagnosis Date  . Cirrhosis   . Degenerative joint disease   . Hypertension   . Gastric ulcer   . Depression   . Asthma   . Mitral valve prolapse 2002  . H/O hiatal hernia   . COPD (chronic obstructive pulmonary disease)   . TIA (transient ischemic attack)     2010  . Stroke     TIA - 2010 - no deficits   . Shortness of breath     with exertion   .  GERD (gastroesophageal reflux disease)   . Headache(784.0)   . Hiatal hernia 1982  . Degenerative joint disease    Axis IV: economic problems, housing problems, other psychosocial or environmental problems and problems related to social environment Axis V: 41-50 serious symptoms  Past Medical History:  Past Medical History  Diagnosis Date  . Cirrhosis   . Degenerative joint disease   . Hypertension   . Gastric ulcer   . Depression   . Asthma   . Mitral valve prolapse 2002  . H/O hiatal hernia   . COPD (chronic obstructive pulmonary disease)   . TIA (transient ischemic attack)     2010  . Stroke     TIA - 2010 - no deficits   . Shortness of breath     with exertion   . GERD (gastroesophageal reflux disease)   . Headache(784.0)   . Hiatal hernia 1982  . Degenerative joint disease     Past Surgical History  Procedure Laterality Date  . Gastrectomy    . Shoulder surgery Bilateral     3 surgeries on on left, 2 surgeries on right   . Rt knee arthroscopic surgery    .  Back surgery      3 cervical spine surgeries C4-C5 fused  . Hernia repair    . Finger surgery Left     2nd, 3rd, & 4th fingers were cut off by table saw and reattached  . Colonoscopy N/A 01/04/2014    Procedure: COLONOSCOPY;  Surgeon: Danie Binder, MD;  Location: [facility];  Service: Endoscopy;  Laterality: N/A;  1:45  . Esophagogastroduodenoscopy N/A 01/04/2014    Procedure: ESOPHAGOGASTRODUODENOSCOPY (EGD);  Surgeon: Danie Binder, MD;  Location: [facility];  Service: Endoscopy;  Laterality: N/A;  . Incisional hernia repair N/A 01/20/2014    Procedure: LAPAROSCOPIC RECURRENT  INCISIONAL HERNIA with mesh;  Surgeon: Edward Jolly, MD;  Location: [facility];  Service: General;  Laterality: N/A;    Family History:  Family History  Problem Relation Age of Onset  . Cancer Father     bone  . Cancer Brother     lungs  . Stroke Maternal Grandmother   . Colon cancer Neg Hx   . Asthma Son     died at  age 41 in his sleep   . Spina bifida Son     died at age 74     Social History:  reports that he has been smoking Cigarettes.  He started smoking about 48 years ago. He has a 45 pack-year smoking history. He has never used smokeless tobacco. He reports that he drinks about 3.6 oz of alcohol per week. He reports that he does not use illicit drugs.  Additional Social History:  Alcohol / Drug Use Pain Medications: hx use of methodone Prescriptions: per pt- prozac and trazadone (overdosed on trazadone a couple weeks ago) History of alcohol / drug use?: Yes Negative Consequences of Use: Financial, Legal, Personal relationships Withdrawal Symptoms: Diarrhea, Tremors, Cramps, Nausea / Vomiting Substance #1 Name of Substance 1: Alcohol 1 - Age of First Use: 17 1 - Amount (size/oz): 2 half gallons and 7 18 packs a week 1 - Frequency: daily  1 - Duration: all day 1 - Last Use / Amount: yesterday   CIWA: CIWA-Ar BP: 153/85 mmHg Pulse Rate: 72 Nausea and Vomiting: no nausea and no vomiting Tactile Disturbances: none Tremor: no tremor Auditory Disturbances: not present Paroxysmal Sweats: no sweat visible Visual Disturbances: not present Anxiety: no anxiety, at ease Headache, Fullness in Head: mild Agitation: normal activity Orientation and Clouding of Sensorium: oriented and can do serial additions CIWA-Ar Total: 2 COWS:    Allergies:  Allergies  Allergen Reactions  . Bee Venom Anaphylaxis  . Penicillins Rash    Home Medications:  (Not in a hospital admission)  OB/GYN Status:  No LMP for male patient.  General Assessment Data Location of Assessment: [facility] Assessment Services ACT Assessment: Yes Is this a Tele or Face-to-Face Assessment?: Tele Assessment Is this an Initial Assessment or a Re-assessment for this encounter?: Initial Assessment Living Arrangements: Other (Comment) (lives in hotel) Can pt return to current living arrangement?:  (if he gets help he can go back to  halfway house) Admission Status: Voluntary Is patient capable of signing voluntary admission?: Yes Transfer from: Home Referral Source: Self/Family/Friend     [place] Living Arrangements: Other (Comment) (lives in hotel) Name of Psychiatrist:  Shanon Brow prescribes prozac and trazadone) Name of Therapist:  Beverly Sessions)  Education Status Is patient currently in school?: No Highest grade of school patient has completed:  (8th)  Risk to self with the past 6 months Suicidal Ideation:  No Suicidal Intent: No Is patient at risk for suicide?: Yes (excessive drinking and risky behaviors) Suicidal Plan?: No Specify Current Suicidal Plan:  (None) Access to Means: No Specify Access to Suicidal Means:  (N/A) What has been your use of drugs/alcohol within the last 12 months?:  (using alcohol daily) Previous Attempts/Gestures: Yes How many times?: 2 (1) Other Self Harm Risks:  (N/A) Triggers for Past Attempts: Other (Comment) (loss of son) Intentional Self Injurious Behavior: Cutting Comment - Self Injurious Behavior:  (cut wrists in a suicide attempt ) Family Suicide History: No Recent stressful life event(s): Loss (Comment), Job Loss (lost 2 sons and his wife) Persecutory voices/beliefs?: No Depression: Yes Depression Symptoms: Loss of interest in usual pleasures, Feeling worthless/self pity Substance abuse history and/or treatment for substance abuse?: Yes Suicide prevention information given to non-admitted patients: Not applicable  Risk to Others within the past 6 months Homicidal Ideation: No Thoughts of Harm to Others: No Current Homicidal Intent: No Current Homicidal Plan: No Access to Homicidal Means: No Identified Victim:  (None) History of harm to others?: No Assessment of Violence: None Noted Violent Behavior Description:  (None) Does patient have access to weapons?: No Criminal Charges Pending?: No Does patient have a court date:  No  Psychosis Hallucinations: None noted Delusions: None noted  Mental Status Report Appear/Hygiene: Disheveled, Poor hygiene Eye Contact: Good Motor Activity: Freedom of movement Speech: Unremarkable Level of Consciousness: Alert Mood: Depressed Affect: Appropriate to circumstance Anxiety Level: Minimal Thought Processes: Coherent Judgement: Impaired Orientation: Person, Place, Time Obsessive Compulsive Thoughts/Behaviors: Minimal  Cognitive Functioning Concentration: Good Memory: Recent Intact, Remote Intact IQ: Average Insight: Poor Impulse Control: Poor Appetite: Poor Weight Loss: 0 Weight Gain: 0 Sleep: Decreased Total Hours of Sleep:  (unknown) Vegetative Symptoms: None  ADLScreening [facility] Assessment Services) Patient's cognitive ability adequate to safely complete daily activities?: Yes Patient able to express need for assistance with ADLs?: No Independently performs ADLs?: Yes (appropriate for developmental age)  Prior Inpatient Therapy Prior Inpatient Therapy: Yes Prior Therapy Dates:  (unknown) Prior Therapy Facilty/Provider(s): unknown Reason for Treatment: detox  Prior Outpatient Therapy Prior Outpatient Therapy: Yes Prior Therapy Dates: 2015 Prior Therapy Facilty/Provider(s):  Consulting civil engineer) Reason for Treatment:  (depression and substance abuse)  ADL Screening (condition at time of admission) Patient's cognitive ability adequate to safely complete daily activities?: Yes Is the patient deaf or have difficulty hearing?: No Does the patient have difficulty seeing, even when wearing glasses/contacts?: No Does the patient have difficulty concentrating, remembering, or making decisions?: No Patient able to express need for assistance with ADLs?: No Does the patient have difficulty dressing or bathing?: No Independently performs ADLs?: Yes (appropriate for developmental age) Does the patient have difficulty walking or climbing stairs?: Yes Weakness of Legs:  Both Weakness of Arms/Hands: Both  Home Assistive Devices/Equipment Home Assistive Devices/Equipment: None  Therapy Consults (therapy consults require a physician order) PT Evaluation Needed: No OT Evalulation Needed: No SLP Evaluation Needed: No Abuse/Neglect Assessment (Assessment to be complete while patient is alone) Physical Abuse: Denies Verbal Abuse: Denies Sexual Abuse: Denies Exploitation of patient/patient's resources: Denies Self-Neglect: Denies Values / Beliefs Cultural Requests During Hospitalization: None Spiritual Requests During Hospitalization: None Consults Spiritual Care Consult Needed: No Social Work Consult Needed: No Regulatory affairs officer (For Healthcare) Does patient have an advance directive?: Yes Would patient like information on creating an advanced directive?: No - patient declined information Type of Advance Directive: Living will Does patient want to make changes to advanced directive?: No - Patient declined  Copy of advanced directive(s) in chart?: No - copy requested    Additional Information 1:1 In Past 12 Months?: No CIRT Risk: No Elopement Risk: No Does patient have medical clearance?: No     Disposition:  Disposition Initial Assessment Completed for this Encounter: Yes Disposition of Patient: Other dispositions Other disposition(s):  (Observation unit per Waylan Boga NP )  On Site Evaluation by:   Reviewed with Physician:    Bedelia Person 08/01/2014 12:00 PM

## 2014-08-02 ENCOUNTER — Encounter (HOSPITAL_COMMUNITY): Payer: Self-pay | Admitting: *Deleted

## 2014-08-02 DIAGNOSIS — G8929 Other chronic pain: Secondary | ICD-10-CM | POA: Diagnosis present

## 2014-08-02 DIAGNOSIS — Z823 Family history of stroke: Secondary | ICD-10-CM | POA: Diagnosis not present

## 2014-08-02 DIAGNOSIS — F10239 Alcohol dependence with withdrawal, unspecified: Secondary | ICD-10-CM

## 2014-08-02 DIAGNOSIS — J449 Chronic obstructive pulmonary disease, unspecified: Secondary | ICD-10-CM | POA: Diagnosis present

## 2014-08-02 DIAGNOSIS — Z825 Family history of asthma and other chronic lower respiratory diseases: Secondary | ICD-10-CM | POA: Diagnosis not present

## 2014-08-02 DIAGNOSIS — Z634 Disappearance and death of family member: Secondary | ICD-10-CM | POA: Diagnosis not present

## 2014-08-02 DIAGNOSIS — K7460 Unspecified cirrhosis of liver: Secondary | ICD-10-CM | POA: Diagnosis present

## 2014-08-02 DIAGNOSIS — F410 Panic disorder [episodic paroxysmal anxiety] without agoraphobia: Secondary | ICD-10-CM | POA: Diagnosis present

## 2014-08-02 DIAGNOSIS — F332 Major depressive disorder, recurrent severe without psychotic features: Secondary | ICD-10-CM | POA: Diagnosis present

## 2014-08-02 DIAGNOSIS — G4700 Insomnia, unspecified: Secondary | ICD-10-CM | POA: Diagnosis present

## 2014-08-02 DIAGNOSIS — R45851 Suicidal ideations: Secondary | ICD-10-CM

## 2014-08-02 DIAGNOSIS — Z609 Problem related to social environment, unspecified: Secondary | ICD-10-CM | POA: Diagnosis present

## 2014-08-02 DIAGNOSIS — F1919 Other psychoactive substance abuse with unspecified psychoactive substance-induced disorder: Secondary | ICD-10-CM

## 2014-08-02 DIAGNOSIS — K219 Gastro-esophageal reflux disease without esophagitis: Secondary | ICD-10-CM | POA: Diagnosis present

## 2014-08-02 DIAGNOSIS — J45909 Unspecified asthma, uncomplicated: Secondary | ICD-10-CM | POA: Diagnosis present

## 2014-08-02 DIAGNOSIS — F4310 Post-traumatic stress disorder, unspecified: Secondary | ICD-10-CM | POA: Diagnosis present

## 2014-08-02 DIAGNOSIS — I10 Essential (primary) hypertension: Secondary | ICD-10-CM | POA: Diagnosis present

## 2014-08-02 DIAGNOSIS — M1990 Unspecified osteoarthritis, unspecified site: Secondary | ICD-10-CM | POA: Diagnosis present

## 2014-08-02 DIAGNOSIS — Z8673 Personal history of transient ischemic attack (TIA), and cerebral infarction without residual deficits: Secondary | ICD-10-CM | POA: Diagnosis not present

## 2014-08-02 DIAGNOSIS — F17210 Nicotine dependence, cigarettes, uncomplicated: Secondary | ICD-10-CM | POA: Diagnosis present

## 2014-08-02 DIAGNOSIS — Z599 Problem related to housing and economic circumstances, unspecified: Secondary | ICD-10-CM | POA: Diagnosis not present

## 2014-08-02 MED ORDER — IPRATROPIUM-ALBUTEROL 20-100 MCG/ACT IN AERS
2.0000 | INHALATION_SPRAY | Freq: Two times a day (BID) | RESPIRATORY_TRACT | Status: DC
  Administered 2014-08-02 – 2014-08-08 (×13): 2 via RESPIRATORY_TRACT
  Filled 2014-08-02 (×3): qty 4

## 2014-08-02 NOTE — Plan of Care (Signed)
[place] Observation Crisis Plan  Reason for Crisis Plan:  Substance Abuse   Plan of Care:  Referral for Substance Abuse  Family Support:      Current Living Environment:  Living Arrangements: Non-relatives/Friends  Insurance:   Hospital Account    Name Acct ID Class Status Primary Coverage   Samuel Reyes, Samuel Reyes [id number] [place]        Guarantor Account (for Hospital Account [id number])    Name Relation to [place]? Acct Type   Samuel Reyes Self [facility] Yes Behavioral Health   Address Phone       [address] [place], [place] [address] [phone])          Coverage Information (for Hospital Account [id number])    F/O Payor/Plan Precert #   [facility] MEDICAID/[place]ICA[place]    Subscriber Subscriber #   Samuel Reyes, Samuel Reyes [id number] [facility]   Address Phone   [address] [place], [place] [address] [phone]      Legal Guardian:     Primary Care Provider:  Lorayne Marek, MD  Current Outpatient Providers:  NA  Psychiatrist:     Counselor/Therapist:     Compliant with Medications:  Yes  Additional Information:   Samuel Reyes 1/4/201612:17 AM

## 2014-08-02 NOTE — Progress Notes (Signed)
Patient stated he has had several falls at home recently.  High fall risk, information reviewed and given to patient.  Right knee sometimes "gives out", surgery 3 years ago, small scar on right knee.  Double hernia surgery 10 months ago.  Five shoulder surgeries 10 yeas ago.  Neck surgeries, several vertebra collapsed approximately 8 yrs ago.  Stated he has been "drinking lots of Bud and Vodka.  Trying to forget the past, 2 children died, wife died 3 yrs ago of Krohn's disease.  9th grade education, dad died when he was 53 yrs old, quit school and went to work.  Last worked 4-5 yrs ago.  Stated he could not read/write.  Had been living with his mother and stepdad in [place].  Parents told him they could not put up with his drinking any longer.  Has been living in [facility], owner Clearnce Sorrel phone [phone].  Stated he used THC over 45 yrs ago.  Only drinks alcohol now.  Denied using cocaine/heroin.  History of hiatal herna, mitral valve prolapse.  Lost his dentures.  Wears glasses.  Denied being abused.  Hears "buzzing noise" occasionally.  SI, no plan, contracts for safety.  Denied A/V hallucinations.  Denied HI.  Rated depression, anxiety and hopeless #10.  Small scar R knee, tattoos on arm/hands, L chest tattoo.  Small scars on chest/abd from hernia/ulcer surgery.  Scar back of neck. Patient oriented to unit.  Food/drink given patient. [address] 37 has box of cigarette tubes, pipe tobacco, tray, bag, cigarette machine roller, coat, cell phone, wallet, belt, cigarettes and lighter, small amount of change, yellow box and yellow bag.   Patient has been cooperative and pleasant.

## 2014-08-02 NOTE — Progress Notes (Signed)
CSW spoke with [facility] Recovery staff who established MRN number for their facility for Pt: [phone]. CSW left voicemail for intake coordinator Merry Proud requesting a call back to set up an assessment for inpatient substance abuse treatment.   Peri Maris, [place] 08/02/2014 2:56 PM

## 2014-08-02 NOTE — Progress Notes (Addendum)
Patient ID: Samuel Reyes, male   DOB: [birth year], 58 y.o.   MRN: [id number] Patient transferred to Adult unit.  Patient escorted to search room with belongings list.  List given to [facility]. Patient to be picked up by an adult nurse.

## 2014-08-02 NOTE — Progress Notes (Signed)
[facility] INPATIENT:  Family/Significant Other Suicide Prevention Education  Suicide Prevention Education:  Patient Refusal for Family/Significant Other Suicide Prevention Education: The patient Samuel Reyes has refused to provide written consent for family/significant other to be provided Family/Significant Other Suicide Prevention Education during admission and/or prior to discharge.  Physician notified.  Gala Romney 08/02/2014, 12:20 AM

## 2014-08-02 NOTE — Tx Team (Addendum)
Initial Interdisciplinary Treatment Plan   PATIENT STRESSORS: Financial difficulties Health problems Marital or family conflict Medication change or noncompliance Occupational concerns Substance abuse Traumatic event   PATIENT STRENGTHS: Motivation for treatment/growth General fund of knowledge Communication skills PROBLEM LIST: Problem List/Patient Goals Date to be addressed Date deferred Reason deferred Estimated date of resolution  "suicidal ideation" 08/02/2014   D/c        "substance abuse" 08/02/2014   D/c        "depression" 08/02/2014   D/c        "I want help with alcohol detox"      "I'm so depressed and need help dealing with the stressors and grief in my life"             DISCHARGE CRITERIA:  Ability to meet basic life and health needs Adequate post-discharge living arrangements Improved stabilization in mood, thinking, and/or behavior Medical problems require only outpatient monitoring Motivation to continue treatment in a less acute level of care Need for constant or close observation no longer present Reduction of life-threatening or endangering symptoms to within safe limits Safe-care adequate arrangements made Verbal commitment to aftercare and medication compliance Withdrawal symptoms are absent or subacute and managed without 24-hour nursing intervention  PRELIMINARY DISCHARGE PLAN: Attend aftercare/continuing care group Attend PHP/IOP Attend 12-step recovery group Outpatient therapy Participate in family therapy Return to previous living arrangement  PATIENT/FAMIILY INVOLVEMENT: This treatment plan has been presented to and reviewed with the patient, Samuel Reyes, would like to be treated for alcohol withdrawals.  The patient and family have been given the opportunity to ask questions and make suggestions.  Cammy Copa 08/02/2014, 6:33 PM

## 2014-08-02 NOTE — Progress Notes (Deleted)
[facility] MD Progress Note  08/02/2014 4:00 PM Samuel Reyes  MRN:  [id number] Subjective:   Patient presented to the ED 08/01/2014 to obtain assistance with alcohol detoxification and for mood stabilization.  Patient states long history of alcohol use in the the context of chronic pain from having 27 surgeries.  Patient reports drinking to self medicate with use of 2 - 18 packs of beer or 1/2 gallon of vodka every day.  Patient reports that alcohol has destroyed his life and resulted in his currently being kicked out of the halfway house  several days ago. Patient endorses multiple losses including the death of his wife 2 years ago and the loss of his 60 year old son to asthma and his 70 year old son to complications from spina bifida.  Patient states he has been feeling depressed over the past few months and was seen at [facility] for stabilization of his depression.  Patient had a prior suicide attempt one month ago by trying to overdose on his medications but states "i just threw them up" and did not seek help at that time.  Patient has history of also trying to cut his wrist following his 29 year old sons death six years ago and again 3 years ago after his wife passed. .  Patient endorses many symptoms of depression including anhedonia, sadness, low energy, insomnia and feelings of worthlessness/hopelessness.  Patient endorses passive SI  at this time without plan.  Patient Pt states that he has chronic pain and just wants to "forget about the stressors in his life" so he drinks. He states that he was on methadone for his pain but was not able to get it any more when he moved from [place] to [place]. Pt states that the longest period of time he's been sober was a few weeks ago he was sober for 2 months at a halfway house. He would like to return to the house of BILL  there but needs to "get help" first according to the landlord before he is allowed to return. Pt has been using alcohol since he was 59 years old and  his first son died at age 22. Pt states that he has alienated himself from his family and friends and feels he is "29 and useless". Some passive SI statements were made however he denies suicidal thoughts at present, denies A/V hallucinations and HI. As patient continues to verbalize passive SI in context of Alcohol withdrawal and CIWA scores are remaining between 7 - 9 recommend inpatient hospitalization for continued detox and stabilization of mood.  Diagnosis:   DSM5: SSubstance/Addictive Disorders:  Alcohol Related Disorder - Severe (303.90) and Alcohol Withdrawal without Perceptual Disturbances (F10.239) Depressive Disorders:  Major Depressive Disorder - Severe (296.23) without psychotic features  Total Time spent with patient: 30 minutes  Axis I: Major Depression, Recurrent severe and Substance Abuse  ADL's:  Intact  Sleep: Poor  Appetite:  Fair   Patient has had multiple GI issues including past history of hiatal hernia, and peptic ulcer disease   Suicidal Ideation:  SI  Without plan Homicidal Ideation:  Denies  AEB (as evidenced by):  Psychiatric Specialty Exam: Physical Exam  Nursing note and vitals reviewed. Constitutional: He is oriented to person, place, and time. He appears well-developed. No distress.  HENT:  Head: Normocephalic and atraumatic.  Neurological: He is alert and oriented to person, place, and time.  Skin: Skin is warm.    Review of Systems  Constitutional: Positive for malaise/fatigue  and diaphoresis.  Gastrointestinal: Positive for heartburn.  Musculoskeletal: Positive for myalgias.  Psychiatric/Behavioral: Positive for depression and substance abuse. The patient has insomnia.   All other systems reviewed and are negative.   Blood pressure 105/65, pulse 94, temperature 97.4 F (36.3 C), temperature source Oral, resp. rate 17, height 5\' 4"  (1.626 m), weight 70.308 kg (155 lb), SpO2 100 %.Body mass index is 26.59 kg/(m^2).  General Appearance: Casual   Eye Contact::  Fair  Speech:  Clear and Coherent and Slow  Volume:  Normal  Mood:  Depressed  Affect:  Congruent  Thought Process:  Coherent and Goal Directed  Orientation:  Full (Time, Place, and Person)  Thought Content:  WDL  Suicidal Thoughts:  Yes.  without intent/plan  Homicidal Thoughts:  No  Memory:  Immediate;   Good Recent;   Fair Remote;   Fair  Judgement:  Fair  Insight:  Fair  Psychomotor Activity:  Normal  Concentration:  Good  Recall:  [place] of Knowledge:Fair  Language: Good  Akathisia:  No  Handed:  Right  AIMS (if indicated):     Assets:  Communication Skills Desire for Improvement Leisure Time  Sleep:      Musculoskeletal: Strength & Muscle Tone: within normal limits Gait & Station: normal Patient leans: N/A  Current Medications: Current Facility-Administered Medications  Medication Dose Route Frequency Provider Last Rate Last Dose  . acetaminophen (TYLENOL) tablet 650 mg  650 mg Oral Q6H PRN Benjamine Mola, FNP      . albuterol-ipratropium (COMBIVENT) inhaler 2 puff  2 puff Inhalation BID Benjamine Mola, FNP   2 puff at 08/02/14 0945  . alum & mag hydroxide-simeth (MAALOX/MYLANTA) 200-200-20 MG/5ML suspension 30 mL  30 mL Oral Q4H PRN Benjamine Mola, FNP      . ARIPiprazole (ABILIFY) tablet 5 mg  5 mg Oral Daily Benjamine Mola, FNP   5 mg at 08/02/14 5885  . famotidine (PEPCID) tablet 20 mg  20 mg Oral BID Benjamine Mola, FNP   20 mg at 08/02/14 0277  . FLUoxetine (PROZAC) capsule 80 mg  80 mg Oral Daily Benjamine Mola, FNP   80 mg at 08/02/14 0734  . gabapentin (NEURONTIN) capsule 300 mg  300 mg Oral BID Benjamine Mola, FNP   300 mg at 08/02/14 0734  . hydrOXYzine (ATARAX/VISTARIL) tablet 25 mg  25 mg Oral Q6H PRN Benjamine Mola, FNP   25 mg at 08/01/14 1539  . loperamide (IMODIUM) capsule 2-4 mg  2-4 mg Oral PRN Benjamine Mola, FNP   4 mg at 08/01/14 1818  . LORazepam (ATIVAN) tablet 1 mg  1 mg Oral Q6H PRN Benjamine Mola, FNP      . LORazepam  (ATIVAN) tablet 1 mg  1 mg Oral TID Benjamine Mola, FNP       Followed by  . [START ON 08/03/2014] LORazepam (ATIVAN) tablet 1 mg  1 mg Oral BID Benjamine Mola, FNP       Followed by  . [START ON 08/05/2014] LORazepam (ATIVAN) tablet 1 mg  1 mg Oral Daily John C Withrow, FNP      . magnesium hydroxide (MILK OF MAGNESIA) suspension 30 mL  30 mL Oral Daily PRN Benjamine Mola, FNP      . metoprolol tartrate (LOPRESSOR) tablet 25 mg  25 mg Oral BID Benjamine Mola, FNP   25 mg at 08/01/14 1542  . multivitamin with minerals tablet 1 tablet  1 tablet Oral Daily Benjamine Mola, FNP   1 tablet at 08/02/14 5638  . naproxen (NAPROSYN) tablet 500 mg  500 mg Oral BID WC Benjamine Mola, FNP   500 mg at 08/02/14 0734  . nicotine (NICODERM CQ - dosed in mg/24 hours) patch 21 mg  21 mg Transdermal Daily Benjamine Mola, FNP   21 mg at 08/02/14 0735  . ondansetron (ZOFRAN-ODT) disintegrating tablet 4 mg  4 mg Oral Q6H PRN Benjamine Mola, FNP      . thiamine (B-1) injection 100 mg  100 mg Intramuscular Once Benjamine Mola, FNP   100 mg at 08/01/14 1513  . thiamine (VITAMIN B-1) tablet 100 mg  100 mg Oral Daily Benjamine Mola, FNP   100 mg at 08/02/14 9373  . traZODone (DESYREL) tablet 250 mg  250 mg Oral QHS Benjamine Mola, FNP   250 mg at 08/01/14 2115    Lab Results:  Results for orders placed or performed during the hospital encounter of 08/01/14 (from the past 48 hour(s))  Hepatic function panel     Status: Abnormal   Collection Time: 08/01/14  9:40 AM  Result Value Ref Range   Total Protein 7.8 6.0 - 8.3 g/dL   Albumin 4.1 3.5 - 5.2 g/dL   AST 19 0 - 37 U/L   ALT 15 0 - 53 U/L   Alkaline Phosphatase 78 39 - 117 U/L   Total Bilirubin 2.6 (H) 0.3 - 1.2 mg/dL   Bilirubin, Direct <0.1 0.0 - 0.3 mg/dL   Indirect Bilirubin NOT CALCULATED 0.3 - 0.9 mg/dL    Comment: Performed at [facility]    Physical Findings: AIMS: Facial and Oral Movements Muscles of Facial Expression: None,  normal Lips and Perioral Area: None, normal Jaw: None, normal Tongue: None, normal,Extremity Movements Upper (arms, wrists, hands, fingers): None, normal Lower (legs, knees, ankles, toes): None, normal, Trunk Movements Neck, shoulders, hips: None, normal, Overall Severity Severity of abnormal movements (highest score from questions above): None, normal Incapacitation due to abnormal movements: None, normal Patient's awareness of abnormal movements (rate only patient's report): No Awareness, Dental Status Current problems with teeth and/or dentures?: No Does patient usually wear dentures?: No  CIWA:  CIWA-Ar Total: 4 COWS:     Treatment Plan Summary: Daily contact with patient to assess and evaluate symptoms and progress in treatment Medication management  Ativan detox protocol in place   Plan:  Recommend transfer to [facility] inpatient adult unit for continued stabilization of mood and affect/ monitoring of withdrawal symptoms.   Medical Decision Making Problem Points:  Established problem, worsening (2) Data Points:  Decision to obtain old records (1) Review or order clinical lab tests (1) Review of medication regiment & side effects (2)  I certify that inpatient services furnished can reasonably be expected to improve the patient's condition.   Kennedy Bucker PMH-NP   08/02/2014, 4:00 PM

## 2014-08-02 NOTE — Progress Notes (Signed)
[facility] MD Progress Note  08/02/2014 5:28 PM Samuel Reyes  MRN:  [id number] Subjective:   Patient presented to the ED 08/01/2014 to obtain assistance with alcohol detoxification and for mood stabilization.  Patient states long history of alcohol use in the the context of chronic pain from having 27 surgeries.  Patient reports drinking to self medicate with use of 2 - 18 packs of beer or 1/2 gallon of vodka every day.  Patient reports that alcohol has destroyed his life and resulted in his currently being kicked out of the halfway house  several days ago. Patient endorses multiple losses including the death of his wife 2 years ago and the loss of his 62 year old son to asthma and his 51 year old son to complications from spina bifida.  Patient states he has been feeling depressed over the past few months and was seen at [facility] for stabilization of his depression.  Patient had a prior suicide attempt one month ago by trying to overdose on his medications but states "i just threw them up" and did not seek help at that time.  Patient has history of also trying to cut his wrist following his 87 year old sons death six years ago and again 3 years ago after his wife passed. .  Patient endorses many symptoms of depression including anhedonia, sadness, low energy, insomnia and feelings of worthlessness/hopelessness.  Patient endorses passive SI  at this time without plan.  Patient Pt states that he has chronic pain and just wants to "forget about the stressors in his life" so he drinks. He states that he was on methadone for his pain but was not able to get it any more when he moved from [place] to [place]. Pt states that the longest period of time he's been sober was a few weeks ago he was sober for 2 months at a halfway house. He would like to return to the house of BILL  there but needs to "get help" first according to the landlord before he is allowed to return. Pt has been using alcohol since he was 59 years old and  his first son died at age 57. Pt states that he has alienated himself from his family and friends and feels he is "59 and useless". Some passive SI statements were made however he denies suicidal thoughts at present, denies A/V hallucinations and HI. As patient continues to verbalize passive SI in context of Alcohol withdrawal and CIWA scores are remaining between 7 - 9 recommend inpatient hospitalization for continued detox and stabilization of mood.  Diagnosis:   DSM5: SSubstance/Addictive Disorders:  Alcohol Related Disorder - Severe (303.90) and Alcohol Withdrawal without Perceptual Disturbances (F10.239) Depressive Disorders:  Major Depressive Disorder - Severe (296.23) without psychotic features  Total Time spent with patient: 30 minutes  Axis I: Major Depression, Recurrent severe and Substance Abuse  ADL's:  Intact  Sleep: Poor  Appetite:  Fair   Patient has had multiple GI issues including past history of hiatal hernia, and peptic ulcer disease   Suicidal Ideation:  SI  Without plan Homicidal Ideation:  Denies  AEB (as evidenced by):  Psychiatric Specialty Exam: Physical Exam  Nursing note and vitals reviewed. Constitutional: He is oriented to person, place, and time. He appears well-developed. No distress.  HENT:  Head: Normocephalic and atraumatic.  Neurological: He is alert and oriented to person, place, and time.  Skin: Skin is warm.    Review of Systems  Constitutional: Positive for malaise/fatigue  and diaphoresis.  Gastrointestinal: Positive for heartburn.  Musculoskeletal: Positive for myalgias.  Psychiatric/Behavioral: Positive for depression and substance abuse. The patient has insomnia.   All other systems reviewed and are negative.   Blood pressure 105/65, pulse 94, temperature 97.4 F (36.3 C), temperature source Oral, resp. rate 17, height 5\' 4"  (1.626 m), weight 70.308 kg (155 lb), SpO2 100 %.Body mass index is 26.59 kg/(m^2).  General Appearance: Casual   Eye Contact::  Fair  Speech:  Clear and Coherent and Slow  Volume:  Normal  Mood:  Depressed  Affect:  Congruent  Thought Process:  Coherent and Goal Directed  Orientation:  Full (Time, Place, and Person)  Thought Content:  WDL  Suicidal Thoughts:  Yes.  without intent/plan  Homicidal Thoughts:  No  Memory:  Immediate;   Good Recent;   Fair Remote;   Fair  Judgement:  Fair  Insight:  Fair  Psychomotor Activity:  Normal  Concentration:  Good  Recall:  [place] of Knowledge:Fair  Language: Good  Akathisia:  No  Handed:  Right  AIMS (if indicated):     Assets:  Communication Skills Desire for Improvement Leisure Time  Sleep:      Musculoskeletal: Strength & Muscle Tone: within normal limits Gait & Station: normal Patient leans: N/A  Current Medications: Current Facility-Administered Medications  Medication Dose Route Frequency Provider Last Rate Last Dose  . acetaminophen (TYLENOL) tablet 650 mg  650 mg Oral Q6H PRN Benjamine Mola, FNP      . albuterol-ipratropium (COMBIVENT) inhaler 2 puff  2 puff Inhalation BID Benjamine Mola, FNP   2 puff at 08/02/14 0945  . alum & mag hydroxide-simeth (MAALOX/MYLANTA) 200-200-20 MG/5ML suspension 30 mL  30 mL Oral Q4H PRN Benjamine Mola, FNP      . ARIPiprazole (ABILIFY) tablet 5 mg  5 mg Oral Daily Benjamine Mola, FNP   5 mg at 08/02/14 6144  . famotidine (PEPCID) tablet 20 mg  20 mg Oral BID Benjamine Mola, FNP   20 mg at 08/02/14 1722  . FLUoxetine (PROZAC) capsule 80 mg  80 mg Oral Daily Benjamine Mola, FNP   80 mg at 08/02/14 0734  . gabapentin (NEURONTIN) capsule 300 mg  300 mg Oral BID Benjamine Mola, FNP   300 mg at 08/02/14 1723  . hydrOXYzine (ATARAX/VISTARIL) tablet 25 mg  25 mg Oral Q6H PRN Benjamine Mola, FNP   25 mg at 08/01/14 1539  . loperamide (IMODIUM) capsule 2-4 mg  2-4 mg Oral PRN Benjamine Mola, FNP   4 mg at 08/01/14 1818  . LORazepam (ATIVAN) tablet 1 mg  1 mg Oral Q6H PRN Benjamine Mola, FNP      . LORazepam  (ATIVAN) tablet 1 mg  1 mg Oral TID Benjamine Mola, FNP   1 mg at 08/02/14 1723   Followed by  . [START ON 08/03/2014] LORazepam (ATIVAN) tablet 1 mg  1 mg Oral BID Benjamine Mola, FNP       Followed by  . [START ON 08/05/2014] LORazepam (ATIVAN) tablet 1 mg  1 mg Oral Daily John C Withrow, FNP      . magnesium hydroxide (MILK OF MAGNESIA) suspension 30 mL  30 mL Oral Daily PRN Benjamine Mola, FNP      . metoprolol tartrate (LOPRESSOR) tablet 25 mg  25 mg Oral BID Benjamine Mola, FNP   25 mg at 08/02/14 1724  . multivitamin with minerals  tablet 1 tablet  1 tablet Oral Daily Benjamine Mola, FNP   1 tablet at 08/02/14 8003  . naproxen (NAPROSYN) tablet 500 mg  500 mg Oral BID WC Benjamine Mola, FNP   500 mg at 08/02/14 1722  . nicotine (NICODERM CQ - dosed in mg/24 hours) patch 21 mg  21 mg Transdermal Daily Benjamine Mola, FNP   21 mg at 08/02/14 0735  . ondansetron (ZOFRAN-ODT) disintegrating tablet 4 mg  4 mg Oral Q6H PRN Benjamine Mola, FNP      . thiamine (B-1) injection 100 mg  100 mg Intramuscular Once Benjamine Mola, FNP   100 mg at 08/01/14 1513  . thiamine (VITAMIN B-1) tablet 100 mg  100 mg Oral Daily Benjamine Mola, FNP   100 mg at 08/02/14 4917  . traZODone (DESYREL) tablet 250 mg  250 mg Oral QHS Benjamine Mola, FNP   250 mg at 08/01/14 2115    Lab Results:  Results for orders placed or performed during the hospital encounter of 08/01/14 (from the past 48 hour(s))  Hepatic function panel     Status: Abnormal   Collection Time: 08/01/14  9:40 AM  Result Value Ref Range   Total Protein 7.8 6.0 - 8.3 g/dL   Albumin 4.1 3.5 - 5.2 g/dL   AST 19 0 - 37 U/L   ALT 15 0 - 53 U/L   Alkaline Phosphatase 78 39 - 117 U/L   Total Bilirubin 2.6 (H) 0.3 - 1.2 mg/dL   Bilirubin, Direct <0.1 0.0 - 0.3 mg/dL   Indirect Bilirubin NOT CALCULATED 0.3 - 0.9 mg/dL    Comment: Performed at [facility]    Physical Findings: AIMS: Facial and Oral Movements Muscles of Facial  Expression: None, normal Lips and Perioral Area: None, normal Jaw: None, normal Tongue: None, normal,Extremity Movements Upper (arms, wrists, hands, fingers): None, normal Lower (legs, knees, ankles, toes): None, normal, Trunk Movements Neck, shoulders, hips: None, normal, Overall Severity Severity of abnormal movements (highest score from questions above): None, normal Incapacitation due to abnormal movements: None, normal Patient's awareness of abnormal movements (rate only patient's report): No Awareness, Dental Status Current problems with teeth and/or dentures?: No Does patient usually wear dentures?: No  CIWA:  CIWA-Ar Total: 4 COWS:     Treatment Plan Summary: Daily contact with patient to assess and evaluate symptoms and progress in treatment Medication management  Ativan detox protocol in place   Plan:  Recommend transfer to [facility] inpatient adult unit for continued stabilization of mood and affect/ monitoring of withdrawal symptoms.   Medical Decision Making Problem Points:  Established problem, worsening (2) Data Points:  Decision to obtain old records (1) Review or order clinical lab tests (1) Review of medication regiment & side effects (2)  I certify that inpatient services furnished can reasonably be expected to improve the patient's condition.   Kennedy Bucker PMH-NP   08/02/2014, 5:28 PM   Reviewed with me as above

## 2014-08-03 ENCOUNTER — Encounter (HOSPITAL_COMMUNITY): Payer: Self-pay | Admitting: Psychiatry

## 2014-08-03 LAB — URINALYSIS, ROUTINE W REFLEX MICROSCOPIC
Bilirubin Urine: NEGATIVE
Glucose, UA: NEGATIVE mg/dL
Hgb urine dipstick: NEGATIVE
KETONES UR: NEGATIVE mg/dL
LEUKOCYTES UA: NEGATIVE
Nitrite: NEGATIVE
PROTEIN: NEGATIVE mg/dL
Specific Gravity, Urine: 1.009 (ref 1.005–1.030)
UROBILINOGEN UA: 0.2 mg/dL (ref 0.0–1.0)
pH: 5.5 (ref 5.0–8.0)

## 2014-08-03 NOTE — Plan of Care (Signed)
Problem: Consults Goal: Anxiety Disorder Patient Education See Patient Education Module for eduction specifics.  Outcome: Completed/Met Date Met:  08/03/14 Nurse discussed anxiety with patient.        

## 2014-08-03 NOTE — Progress Notes (Signed)
Pt attended spiritual care group on grief and loss facilitated by chaplain Jerene Pitch and counseling intern [place] Jaydon Avina. Group opened with brief discussion and psycho-social ed around grief and loss in relationships and in relation to self - identifying life patterns, circumstances, changes that cause losses. Established group norm of speaking from own life experience. Group goal of establishing open and affirming space for members to share loss and experience with grief, normalize grief experience and provide psycho social education and grief support.  Group drew on narrative and Alderian therapeutic modalities.   Samuel Reyes was present throughout group and shared about the loss of his two sons and wife. [name] expressed doubts with his faith after these losses, and also how grief has lead to substance use. [name] was supported by other group members and was receptive to their feedback.   [place] Brazos Sandoval Counseling Intern

## 2014-08-03 NOTE — [facility] Group Notes (Signed)
The focus of this group is to educate the patient on the purpose and policies of crisis stabilization and provide a format to answer questions about their admission.  The group details unit policies and expectations of patients while admitted.  Patient attended 0900 nurse education orientation group this morning.  Patient listened attentively, appropriate affect, alert, appropriate insight and engagement.  Today patient will work on 3 goals for discharge.  

## 2014-08-03 NOTE — Tx Team (Signed)
Interdisciplinary Treatment Plan Update (Adult) Date: 08/03/2014   Time Reviewed: 9:30 AM  Progress in Treatment: Attending groups: Continuing to assess, patient new to milieu Participating in groups: Continuing to assess, patient new to milieu Taking medication as prescribed: Yes Tolerating medication: Yes Family/Significant other contact made: No, CSW assessing for appropriate contacts Patient understands diagnosis: Yes Discussing patient identified problems/goals with staff: Yes Medical problems stabilized or resolved: Yes Denies suicidal/homicidal ideation: Yes Issues/concerns per patient self-inventory: Yes Other:  New problem(s) identified: N/A  Discharge Plan or Barriers: CSW continuing to assess.  Reason for Continuation of Hospitalization:  Depression Anxiety Medication Stabilization   Comments: N/A  Estimated length of stay: 3-5 days  For review of initial/current patient goals, please see plan of care. Patient is a 59 year old Caucasian Male with a diagnosis of Alcohol Abuse, Major Depression, single episode and Substance Induced Mood Disorder. Patient lives in [place] alone. Patient will benefit from crisis stabilization, medication evaluation, group therapy, and psycho education in addition to case management for discharge planning. Patient and CSW reviewed pt's identified goals and treatment plan. Pt verbalized understanding and agreed to treatment plan.  Attendees: Patient:    Family:    Physician: Dr. Sabra Heck; Dr. Parke Poisson 08/03/2014 9:30 AM   Nursing: Grayland Ormond; Margarito Courser, RN 08/03/2014 9:30 AM   Clinical Social Worker: Tilden Fossa, [place]  08/03/2014 9:30 AM   Other: Joette Catching, LCSW 08/03/2014 9:30 AM   Other: Lucinda Dell, [facility] 08/03/2014 9:30 AM   Other: Norberto Sorenson, P4CC 08/03/2014 9:30 AM   Other: Lars Pinks, Case Manager 08/03/2014 9:30 AM   Other:    Other:      Scribe for Treatment Team:  Tilden Fossa, MSW, [place] [phone]
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
## 2014-08-03 NOTE — BHH Suicide Risk Assessment (Signed)
[place] INPATIENT:  Family/Significant Other Suicide Prevention Education  Suicide Prevention Education:  Patient Refusal for Family/Significant Other Suicide Prevention Education: The patient Samuel Reyes has refused to provide written consent for family/significant other to be provided Family/Significant Other Suicide Prevention Education during admission and/or prior to discharge.  Physician notified. SPE reviewed with patient, brochure provided.  Florencio Hollibaugh, Casimiro Needle 08/03/2014, 3:40 PM

## 2014-08-03 NOTE — Progress Notes (Signed)
Recreation Therapy Notes  Animal-Assisted Activity/Therapy (AAA/T) Program Checklist/Progress Notes Patient Eligibility Criteria Checklist & Daily Group note for Rec Tx Intervention  Date: 01.05.2015 Time: 2:45pm Location: [address]    AAA/T Program Assumption of Risk Form signed by Patient/ or Parent Legal Guardian yes  Patient is free of allergies or sever asthma yes  Patient reports no fear of animals yes  Patient reports no history of cruelty to animals yes  Patient understands his/her participation is voluntary yes  Patient washes hands before animal contact yes  Patient washes hands after animal contact yes  Behavioral Response: Engaged, Appropriate   Education: Contractor, Appropriate Animal Interaction   Education Outcome: Acknowledges education.   Clinical Observations/Feedback: Patient interacted appropriately with therapy dog and peers. Patient shared stories about his pet at home and asked appropriate questions about therapy dog and his training.   Laureen Ochs Latonda Larrivee, LRT/CTRS  Zilda No L 08/03/2014 4:53 PM

## 2014-08-03 NOTE — Clinical Social Work Note (Signed)
CSW met with patient who reports that he is interested in residential treatment and provided permission for CSW to initiate referral to [facility] and [place] faxed information to [facility] for review.  Tilden Fossa, MSW, [place] Worker [facility] [phone]

## 2014-08-03 NOTE — BHH Group Notes (Signed)
[place] LCSW Group Therapy  08/03/2014   1:15 PM   Type of Therapy:  Group Therapy  Participation Level:  Active  Participation Quality:  Attentive, Sharing and Supportive  Affect:  Appropriate  Cognitive:  Alert and Oriented  Insight:  Developing/Improving and Engaged  Engagement in Therapy:  Developing/Improving and Engaged  Modes of Intervention:  Clarification, Confrontation, Discussion, Education, Exploration, Limit-setting, Orientation, Problem-solving, Rapport Building, Art therapist, Socialization and Support  Summary of Progress/Problems: The topic for group therapy was feelings about diagnosis.  Pt actively participated in group discussion on their past and current diagnosis and how they feel towards this.  Pt also identified how society and family members judge them, based on their diagnosis as well as stereotypes and stigmas.  Patient discussed feeling embarrassed and judged by others regarding his mental health/substance abuse issues. CSW's and other group members provided emotional support and encouragement.  Tilden Fossa, MSW, [place] Worker [facility] [phone]

## 2014-08-03 NOTE — Progress Notes (Signed)
D:  Patient's self inventory sheet, patient slept good last night, sleep medication is helpful.  Fair appetite, low energy level, poor concentration.  Rated depression and anxiety 8, hopeless 7.   Stated he is still experiencing withdrawals of tremors, diarrhea, chilling, cravings, irritability.  Denied SI.  Physical problems of pain, dizziness, headaches.  Pain medications is helping his neck and legs, worst pain 8.  Goal is to stay straight.  Plans to think positive.  No discharge plans.  No problems anticipated at discharge at this time. A:  Medications administered per MD orders.  Emotional support and encouragement given patient. R:  Denied SI and HI, contracts for safety.  Denied A/V hallucinations.  Safety maintained with 15 minute checks.

## 2014-08-03 NOTE — Progress Notes (Signed)
UA completed and put in lab for pick up tonight.

## 2014-08-03 NOTE — Progress Notes (Signed)
Pt reports he is glad he was able to come from the OBS unit to continue his detox from alcohol.  He reports he is still having significant withdrawal symptoms at this point.  He has been observed in the dayroom watching TV and talking with his peers.  Pt denies SI/HI/AV at this time, but says his depression and anxiety levels are still high.  Pt makes his needs known and is med compliant with the detox protocol.  Pt plans to go to long term treatment after detox.  Support and encouragement offered.  Safety maintained with q15 minute checks.
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
## 2014-08-03 NOTE — B[facility] Suicide Risk Assessment (Signed)
Suicide Risk Assessment  Admission Assessment     Nursing information obtained from:  Patient Demographic factors:  Male, Caucasian, Low socioeconomic status Current Mental Status:    Loss Factors:  Loss of significant relationship, Financial problems / change in socioeconomic status Historical Factors:  Prior suicide attempts, Impulsivity Risk Reduction Factors:  Religious beliefs about death Total Time spent with patient: 45 minutes  CLINICAL FACTORS:   Depression:   Comorbid alcohol abuse/dependence Impulsivity Alcohol/Substance Abuse/Dependencies  COGNITIVE FEATURES THAT CONTRIBUTE TO RISK:  Closed-mindedness Polarized thinking Thought constriction (tunnel vision)    SUICIDE RISK:   Moderate:  Frequent suicidal ideation with limited intensity, and duration, some specificity in terms of plans, no associated intent, good self-control, limited dysphoria/symptomatology, some risk factors present, and identifiable protective factors, including available and accessible social support.  PLAN OF CARE: Supportive approach/coping skills/relapse prevention                               Detox with Ativan/reassess and address the co morbidities  I certify that inpatient services furnished can reasonably be expected to improve the patient's condition.  Nathalya Wolanski A 08/03/2014, 5:11 PM

## 2014-08-03 NOTE — Clinical Social Work Note (Signed)
CSW spoke with Sunny Schlein [phone] regarding patient's discharge to [facility] program. Darden Dates is agreeable to patient returning at discharge.  Tilden Fossa, MSW, [place] Worker [facility] [phone]

## 2014-08-03 NOTE — Progress Notes (Signed)
Pt reports he is feeling better this evening and his withdrawal symptoms are decreasing.  He says he is still depressed about his life situation, but he is says he is ready to move on and go to a treatment program for his alcohol abuse.  He says he would like to return to [facility] if a bed is available.  He has been there before, but he is willing to go somewhere else if a bed opens up.  Pt denies SI/HI/AV at this time.  Pt makes his needs known to staff and is attending groups.  He is taking his meds and participating in his treatment.  Support and encouragement offered.  Safety maintained with q15 minute checks.

## 2014-08-04 MED ORDER — ACAMPROSATE CALCIUM 333 MG PO TBEC
666.0000 mg | DELAYED_RELEASE_TABLET | Freq: Three times a day (TID) | ORAL | Status: DC
  Administered 2014-08-04 – 2014-08-08 (×14): 666 mg via ORAL
  Filled 2014-08-04 (×2): qty 2
  Filled 2014-08-04: qty 84
  Filled 2014-08-04 (×2): qty 2
  Filled 2014-08-04: qty 84
  Filled 2014-08-04 (×7): qty 2
  Filled 2014-08-04: qty 84
  Filled 2014-08-04 (×5): qty 2

## 2014-08-04 MED ORDER — TRAZODONE HCL 150 MG PO TABS
300.0000 mg | ORAL_TABLET | Freq: Every day | ORAL | Status: DC
  Administered 2014-08-04 – 2014-08-08 (×5): 300 mg via ORAL
  Filled 2014-08-04 (×5): qty 2
  Filled 2014-08-04: qty 28
  Filled 2014-08-04: qty 2

## 2014-08-04 NOTE — [facility] Group Notes (Signed)
[facility] LCSW Aftercare Discharge Planning Group Note   08/04/2014 1:15 PM    Participation Quality:  Appropraite  Mood/Affect:  Appropriate  Depression Rating:  8  Anxiety Rating:  7  Thoughts of Suicide:  No  Will you contract for safety?   NA  Current AVH:  No  Plan for Discharge/Comments:  Patient attended discharge planning group and actively participated in group. Patient is requesting residential treatment at [facility] or [facility].  Suicide prevention education reviewed and SPE document provided.   Transportation Means: Patient has transportation.   Supports:  Patient has a support system.   Tyler Cubit, [place] Post

## 2014-08-04 NOTE — Progress Notes (Addendum)
D:  Patient's self inventory sheet, patient had fair sleep last night, sleep medication is helpful.  Fair appetite, low energy level, good concentration.  Denied depression and hopeless.  Rated anxiety 7.  Has experienced tremors, diarrhea, chilling, cravings, cramping, irritability.  Physical problems of being lightheaded, pain, dizziness, headaches, blurred vision.  Physical pain worst #8, neck, shoulders, knee.  Pain medication is not helpful.  Goal is "being clean, stay here".  No discharge plans.  No problems anticipated after discharge. A:  Medications administered per MD orders.  Emotional support and encouragement given patient. R:  Denied SI and HI.  Denied visual hallucinations.  Does hear sound of woodpecker.  Contracts for safety.

## 2014-08-04 NOTE — Progress Notes (Signed)
D: Patient in the dayroom interacting with peers on approach.  Patient states he had a good day.  Patient state he wants to stop drinking.  Patient states I am tired and I want to do better.  Patient denies SI/HI but states he is hearing noises.  Patient pleasant and cooperative.   A: Staff to monitor Q 15 mins for safety.  Encouragement and support offered.  Scheduled medications administered per orders. R: Patient remains safe on the unit.  Patient attended group tonight.  Patient visible on the unit and interacting with peers.  Patient taking administered medications.

## 2014-08-04 NOTE — [facility] Group Notes (Signed)
Adult Psychoeducational Group Note  Date:  08/04/2014 Time:  9:59 PM  Group Topic/Focus:  NA Meeting  Participation Level:  Minimal  Participation Quality:  Attentive  Affect:  Flat  Cognitive:  Alert  Insight: None  Engagement in Group:  Limited  Modes of Intervention:  Discussion and Education  Additional Comments:  Samuel Reyes attended group.  [name] A 08/04/2014, 9:59 PM

## 2014-08-04 NOTE — Plan of Care (Signed)
Problem: Consults Goal: Substance Abuse Patient Education See Patient Education Module for education specifics.  Outcome: Completed/Met Date Met:  08/04/14 Nurse discussed alcohol abuse with patient.

## 2014-08-04 NOTE — Progress Notes (Signed)
[facility] MD Progress Note  08/04/2014 1:19 PM Samuel Reyes  MRN:  [id number] Subjective:  Samuel Reyes states he needs to go to rehab. He states he needs to know more about the "disease of addiction." states he needs to accept that he is not going to ever be able to drink like other people can. He is still having a hard time with sleep. States he used to be on Trazodone 300 mg HS in the past and that it was effective. He is still reciting the same story about all his losses including his physical health  and how he is not able to move on.  Diagnosis:   DSM5: Trauma-Stressor Disorders:  Posttraumatic Stress Disorder (309.81) Substance/Addictive Disorders:  Alcohol Related Disorder - Severe (303.90) Depressive Disorders:  Major Depressive Disorder - Moderate (296.22) Total Time spent with patient: 30 minutes  Axis I: Substance Induced Mood Disorder  ADL's:  Intact  Sleep: Poor  Appetite:  Fair Psychiatric Specialty Exam: Physical Exam  Review of Systems  Constitutional: Negative.   HENT: Negative.   Eyes: Negative.   Respiratory: Negative.   Cardiovascular: Negative.   Gastrointestinal: Negative.   Genitourinary: Negative.   Musculoskeletal: Positive for back pain and joint pain.  Skin: Negative.   Neurological: Negative.   Endo/Heme/Allergies: Negative.   Psychiatric/Behavioral: Positive for depression and substance abuse. The patient is nervous/anxious.     Blood pressure 140/76, pulse 66, temperature 97.9 F (36.6 C), temperature source Oral, resp. rate 18, height 5' 3.5" (1.613 m), weight 70.308 kg (155 lb), SpO2 99 %.Body mass index is 27.02 kg/(m^2).  General Appearance: Fairly Groomed  Engineer, water::  Fair  Speech:  Clear and Coherent  Volume:  Normal  Mood:  Anxious, Depressed and worried  Affect:  restricted  Thought Process:  Coherent and Goal Directed  Orientation:  Full (Time, Place, and Person)  Thought Content:  Rumination and symptoms events worries concerns  Suicidal  Thoughts:  No  Homicidal Thoughts:  No  Memory:  Immediate;   Fair Recent;   Fair Remote;   Fair  Judgement:  Fair  Insight:  Present and Shallow  Psychomotor Activity:  Restlessness  Concentration:  Fair  Recall:  [employer] of Knowledge:Fair  Language: Fair  Akathisia:  No  Handed:  Right  AIMS (if indicated):     Assets:  Desire for Improvement  Sleep:  Number of Hours: 6   Musculoskeletal: Strength & Muscle Tone: within normal limits Gait & Station: normal Patient leans: N/A  Current Medications: Current Facility-Administered Medications  Medication Dose Route Frequency Provider Last Rate Last Dose  . acamprosate (CAMPRAL) tablet 666 mg  666 mg Oral TID WC Nicholaus Bloom, MD   666 mg at 08/04/14 1202  . acetaminophen (TYLENOL) tablet 650 mg  650 mg Oral Q6H PRN Benjamine Mola, FNP      . alum & mag hydroxide-simeth (MAALOX/MYLANTA) 200-200-20 MG/5ML suspension 30 mL  30 mL Oral Q4H PRN Benjamine Mola, FNP      . ARIPiprazole (ABILIFY) tablet 5 mg  5 mg Oral Daily Benjamine Mola, FNP   5 mg at 08/04/14 0804  . famotidine (PEPCID) tablet 20 mg  20 mg Oral BID Benjamine Mola, FNP   20 mg at 08/04/14 0804  . FLUoxetine (PROZAC) capsule 80 mg  80 mg Oral Daily Benjamine Mola, FNP   80 mg at 08/04/14 0804  . gabapentin (NEURONTIN) capsule 300 mg  300 mg Oral BID Benjamine Mola,  FNP   300 mg at 08/04/14 0804  . hydrOXYzine (ATARAX/VISTARIL) tablet 25 mg  25 mg Oral Q6H PRN Benjamine Mola, FNP   25 mg at 08/01/14 1539  . Ipratropium-Albuterol (COMBIVENT) respimat 2 puff  2 puff Inhalation BID Nicholaus Bloom, MD   2 puff at 08/04/14 0805  . loperamide (IMODIUM) capsule 2-4 mg  2-4 mg Oral PRN Benjamine Mola, FNP   2 mg at 08/02/14 2207  . LORazepam (ATIVAN) tablet 1 mg  1 mg Oral Q6H PRN Benjamine Mola, FNP      . [START ON 08/05/2014] LORazepam (ATIVAN) tablet 1 mg  1 mg Oral Daily John C Withrow, FNP      . magnesium hydroxide (MILK OF MAGNESIA) suspension 30 mL  30 mL Oral Daily PRN  Benjamine Mola, FNP      . metoprolol tartrate (LOPRESSOR) tablet 25 mg  25 mg Oral BID Benjamine Mola, FNP   25 mg at 08/04/14 0806  . multivitamin with minerals tablet 1 tablet  1 tablet Oral Daily Benjamine Mola, FNP   1 tablet at 08/04/14 0810  . naproxen (NAPROSYN) tablet 500 mg  500 mg Oral BID WC Benjamine Mola, FNP   500 mg at 08/04/14 0806  . nicotine (NICODERM CQ - dosed in mg/24 hours) patch 21 mg  21 mg Transdermal Daily Benjamine Mola, FNP   21 mg at 08/04/14 1572  . ondansetron (ZOFRAN-ODT) disintegrating tablet 4 mg  4 mg Oral Q6H PRN Benjamine Mola, FNP      . thiamine (B-1) injection 100 mg  100 mg Intramuscular Once Benjamine Mola, FNP   100 mg at 08/01/14 1513  . thiamine (VITAMIN B-1) tablet 100 mg  100 mg Oral Daily Benjamine Mola, FNP   100 mg at 08/04/14 6203  . traZODone (DESYREL) tablet 300 mg  300 mg Oral QHS Nicholaus Bloom, MD        Lab Results:  Results for orders placed or performed during the hospital encounter of 08/01/14 (from the past 48 hour(s))  Urinalysis, Routine w reflex microscopic     Status: None   Collection Time: 08/03/14  7:28 PM  Result Value Ref Range   Color, Urine YELLOW YELLOW   APPearance CLEAR CLEAR   Specific Gravity, Urine 1.009 1.005 - 1.030   pH 5.5 5.0 - 8.0   Glucose, UA NEGATIVE NEGATIVE mg/dL   Hgb urine dipstick NEGATIVE NEGATIVE   Bilirubin Urine NEGATIVE NEGATIVE   Ketones, ur NEGATIVE NEGATIVE mg/dL   Protein, ur NEGATIVE NEGATIVE mg/dL   Urobilinogen, UA 0.2 0.0 - 1.0 mg/dL   Nitrite NEGATIVE NEGATIVE   Leukocytes, UA NEGATIVE NEGATIVE    Comment: MICROSCOPIC NOT DONE ON URINES WITH NEGATIVE PROTEIN, BLOOD, LEUKOCYTES, NITRITE, OR GLUCOSE <1000 mg/dL. Performed at [facility]     Physical Findings: AIMS: Facial and Oral Movements Muscles of Facial Expression: None, normal Lips and Perioral Area: None, normal Jaw: None, normal Tongue: None, normal,Extremity Movements Upper (arms, wrists, hands,  fingers): None, normal Lower (legs, knees, ankles, toes): None, normal, Trunk Movements Neck, shoulders, hips: None, normal, Overall Severity Severity of abnormal movements (highest score from questions above): None, normal Incapacitation due to abnormal movements: None, normal Patient's awareness of abnormal movements (rate only patient's report): No Awareness, Dental Status Current problems with teeth and/or dentures?: No Does patient usually wear dentures?: No  CIWA:  CIWA-Ar Total: 1 COWS:  COWS Total Score:  2  Treatment Plan Summary: Daily contact with patient to assess and evaluate symptoms and progress in treatment Medication management  Plan: Supportive approach/coping skills/relapse prevention           Insomnia: will increase the Trazodone to 300 mg HS ( he claims it was effective in the past)           Major Depression: optimize response to the Prozac ( at 80 mg pretty much as high a dosage as it should go) Will consider increasing the Abilify as a way to  augment it           Alcohol dependence: continue detox. Will start Campral 333 mg two TID for cravings           Will challenge to change the story and quit repeating it over and over again as he seems to be reinforcing it and creating a situation where change will not be possible.Encourage to  reinvent himself and move on with what he has left rather than focus on what he has lost and quit feeling sorry for himself             Medical Decision Making Problem Points:  Established problem, worsening (2) and Review of psycho-social stressors (1) Data Points:  Review of medication regiment & side effects (2) Review of new medications or change in dosage (2)  I certify that inpatient services furnished can reasonably be expected to improve the patient's condition.   Tiwana Chavis A 08/04/2014, 1:19 PM

## 2014-08-05 DIAGNOSIS — Z634 Disappearance and death of family member: Secondary | ICD-10-CM

## 2014-08-05 NOTE — Progress Notes (Signed)
D: Patient denies SI/HI and A/V hallucinations; patient reports sleep is fair; reports appetite is good; reports energy level is normal ; reports ability to concentrate is good; rates depression as 7/10; rates hopelessness 6/10; rates anxiety as 8/10; patient having complaints of pain in neck and shoulders; patient goal is to stay here today and to talk and engage with others  A: Monitored q 15 minutes; patient encouraged to attend groups; patient educated about medications; patient given medications per physician orders; patient encouraged to express feelings and/or concerns  R: Patient is pleasant and animated; patient talks about the death of son and wife with this Probation officer and also talks about all the surgeries he has had; patient is sad while talking about the subjects but states " im still kicking"; patient states " 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

## 2014-08-05 NOTE — Progress Notes (Signed)
D   Pt is pleasant on approach and is cooperative    He reports sleeping 5 hours last night and that was good for him but he is hoping to be able to sleep more tonight and on a regular basis   He attended karaoke group and said it was enjoyable but he did not sing    He continues to endorse depression but denies suicidal ideation at this point A   Verbal support given   Medications administered and effectiveness monitored   Q 15 min checks R   Pt safe at present

## 2014-08-05 NOTE — Progress Notes (Signed)
[facility] MD Progress Note  08/05/2014 3:26 PM Samuel Reyes  MRN:  [id number] Subjective:  States he slept few more hours with the extra 50 mg of Trazodone ( 300 mg) states that this is the dose that usually helps him. States that he needs to go to a residential treatment program. He [facility] and wait for a bed to come open. He would rather go to a program from here. He is still ruminating about his losses his surgeries. He still cant see past that.  Diagnosis:   DSM5: Trauma-Stressor Disorders:  Posttraumatic Stress Disorder (309.81) Substance/Addictive Disorders:  Alcohol Related Disorder - Severe (303.90) Depressive Disorders:  Major Depressive Disorder - Severe (296.23) Total Time spent with patient: 20 minutes  Axis I: Bereavement  ADL's:  Intact  Sleep: Fair  Appetite:  Fair Psychiatric Specialty Exam: Physical Exam  Review of Systems  Constitutional: Positive for malaise/fatigue.  HENT: Negative.   Eyes: Negative.   Respiratory: Negative.   Cardiovascular: Negative.   Gastrointestinal: Negative.   Genitourinary: Negative.   Musculoskeletal: Negative.   Skin: Negative.   Neurological: Negative.   Endo/Heme/Allergies: Negative.   Psychiatric/Behavioral: Positive for depression and substance abuse. The patient is nervous/anxious.     Blood pressure 135/81, pulse 80, temperature 97.7 F (36.5 C), temperature source Oral, resp. rate 18, height 5' 3.5" (1.613 m), weight 70.308 kg (155 lb), SpO2 99 %.Body mass index is 27.02 kg/(m^2).  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, his business   Suicidal Thoughts:  No  Homicidal Thoughts:   No  Memory:  Immediate;   Fair Recent;   Fair Remote;   Fair  Judgement:  Fair  Insight:  Present  Psychomotor Activity:  Normal  Concentration:  Fair  Recall:  [employer] of [place]  Language: Fair  Akathisia:  No  Handed:  Right  AIMS (if indicated):     Assets:  Desire for Improvement  Sleep:  Number of Hours: 6.25   Musculoskeletal: Strength & Muscle Tone: within normal limits Gait & Station: normal Patient leans: N/A  Current Medications: Current Facility-Administered Medications  Medication Dose Route Frequency Provider Last Rate Last Dose  . acamprosate (CAMPRAL) tablet 666 mg  666 mg Oral TID WC Nicholaus Bloom, MD   666 mg at 08/05/14 1206  . acetaminophen (TYLENOL) tablet 650 mg  650 mg Oral Q6H PRN Benjamine Mola, FNP   650 mg at 08/05/14 9924  . alum & mag hydroxide-simeth (MAALOX/MYLANTA) 200-200-20 MG/5ML suspension 30 mL  30 mL Oral Q4H PRN Benjamine Mola, FNP      . ARIPiprazole (ABILIFY) tablet 5 mg  5 mg Oral Daily Benjamine Mola, FNP   5 mg at 08/05/14 0756  . famotidine (PEPCID) tablet 20 mg  20 mg Oral BID Benjamine Mola, FNP   20 mg at 08/05/14 0756  . FLUoxetine (PROZAC) capsule 80 mg  80 mg Oral Daily Benjamine Mola, FNP   80 mg at 08/05/14 0755  . gabapentin (NEURONTIN) capsule 300 mg  300 mg Oral BID Benjamine Mola, FNP   300 mg at 08/05/14 0756  .  Ipratropium-Albuterol (COMBIVENT) respimat 2 puff  2 puff Inhalation BID Nicholaus Bloom, MD   2 puff at 08/05/14 0759  . magnesium hydroxide (MILK OF MAGNESIA) suspension 30 mL  30 mL Oral Daily PRN Benjamine Mola, FNP      . metoprolol tartrate (LOPRESSOR) tablet 25 mg  25 mg Oral BID Benjamine Mola, FNP   25 mg at 08/05/14 0756  . multivitamin with minerals tablet 1 tablet  1 tablet Oral Daily Benjamine Mola, FNP   1 tablet at 08/05/14 0756  . naproxen (NAPROSYN) tablet 500 mg  500 mg Oral BID WC Benjamine Mola, FNP   500 mg at 08/05/14 0756  . nicotine (NICODERM CQ - dosed in mg/24 hours) patch 21 mg   21 mg Transdermal Daily Benjamine Mola, FNP   21 mg at 08/05/14 0800  . thiamine (B-1) injection 100 mg  100 mg Intramuscular Once Benjamine Mola, FNP   100 mg at 08/01/14 1513  . thiamine (VITAMIN B-1) tablet 100 mg  100 mg Oral Daily Benjamine Mola, FNP   100 mg at 08/05/14 0756  . traZODone (DESYREL) tablet 300 mg  300 mg Oral QHS Nicholaus Bloom, MD   300 mg at 08/04/14 2130    Lab Results:  Results for orders placed or performed during the hospital encounter of 08/01/14 (from the past 48 hour(s))  Urinalysis, Routine w reflex microscopic     Status: None   Collection Time: 08/03/14  7:28 PM  Result Value Ref Range   Color, Urine YELLOW YELLOW   APPearance CLEAR CLEAR   Specific Gravity, Urine 1.009 1.005 - 1.030   pH 5.5 5.0 - 8.0   Glucose, UA NEGATIVE NEGATIVE mg/dL   Hgb urine dipstick NEGATIVE NEGATIVE   Bilirubin Urine NEGATIVE NEGATIVE   Ketones, ur NEGATIVE NEGATIVE mg/dL   Protein, ur NEGATIVE NEGATIVE mg/dL   Urobilinogen, UA 0.2 0.0 - 1.0 mg/dL   Nitrite NEGATIVE NEGATIVE   Leukocytes, UA NEGATIVE NEGATIVE    Comment: MICROSCOPIC NOT DONE ON URINES WITH NEGATIVE PROTEIN, BLOOD, LEUKOCYTES, NITRITE, OR GLUCOSE <1000 mg/dL. Performed at [facility]     Physical Findings: AIMS: Facial and Oral Movements Muscles of Facial Expression: None, normal Lips and Perioral Area: None, normal Jaw: None, normal Tongue: None, normal,Extremity Movements Upper (arms, wrists, hands, fingers): None, normal Lower (legs, knees, ankles, toes): None, normal, Trunk Movements Neck, shoulders, hips: None, normal, Overall Severity Severity of abnormal movements (highest score from questions above): None, normal Incapacitation due to abnormal movements: None, normal Patient's awareness of abnormal movements (rate only patient's report): No Awareness, Dental Status Current problems with teeth and/or dentures?: No Does patient usually wear dentures?: No  CIWA:  CIWA-Ar  Total: 0 COWS:  COWS Total Score: 2  Treatment Plan Summary: Daily contact with patient to assess and evaluate symptoms and progress in treatment Medication management  Plan: Supportive approach/coping skills/relapse prevention           Alcohol Dependence: pursue the Campral 666 mg TID further           Insomnia: continue the Trazodone at 300 mg HS ( states he got some longer hours of                 sleep with the higher dose reporting no side effects)           Continue the Prozac at 80/Abilify at 5 seems to be tolerating well  Continue to work on his grief/loss           Explore residential treatment options Medical Decision Making Problem Points:  Review of psycho-social stressors (1) Data Points:  Review of medication regiment & side effects (2) Review of new medications or change in dosage (2)  I certify that inpatient services furnished can reasonably be expected to improve the patient's condition.   Garland Smouse A 08/05/2014, 3:26 PM

## 2014-08-05 NOTE — BHH Group Notes (Signed)
[place] LCSW Group Therapy  [facility] 1:15 - 2:30 PM  08/05/2014 1:58 PM   Type of Therapy:  Group Therapy  Participation Level: Active  Participation Quality:  Attentive  Affect:  Appropriate  Cognitive:  Appropriate  Insight:  Developing/Improving   Engagement in Therapy:  Developing/Improving   Modes of Intervention:  Discussion, Education, Exploration, Problem-Solving, Rapport Building, Support   Summary of Progress/Problems:   Patient was attentive to speaker from the [employer] [facility] [employer] as he shared his story of dealing with mental health/substance abuse issues and overcoming it by working a recovery program.  Patient expressed interest in their programs and services and received information on their agency.    Concha Pyo 08/05/2014 1:58 PM

## 2014-08-05 NOTE — Progress Notes (Signed)
Pt attended the [place] speaker meeting.

## 2014-08-05 NOTE — BHH Group Notes (Signed)
[place] Group Notes:  (Nursing/MHT/Case Management/Adjunct)  Date:  08/05/2014  Time:  0915   Type of Therapy:  Psychoeducational Skills  Participation Level:  Active  Participation Quality:  Appropriate  Affect:  Appropriate  Cognitive:  Alert  Insight:  Good  Engagement in Group:  Engaged  Modes of Intervention:  Discussion, Education, Exploration and Support  Summary of Progress/Problems: Pt able to identify current feelings. Pt also identified one leisure activity and one healthy lifestyle change to make once discharged.   Elenore Rota 08/05/2014, 10:52 AM

## 2014-08-05 NOTE — Clinical Social Work Note (Addendum)
CSW spoke with Melissa at [facility].  She advised of not having any beds at this time.  She shared if patient discharges, he can continue to contact them from home.  Patient and MD will be advised.  Follow up message left with Merry Proud at [facility] to see if patient meets criteria for their facility.

## 2014-08-05 NOTE — Progress Notes (Signed)
Recreation Therapy Notes  Animal-Assisted Activity/Therapy (AAA/T) Program Checklist/Progress Notes Patient Eligibility Criteria Checklist & Daily Group note for Rec Tx Intervention  Date: 01.07.2015 Time: 2:45pm Location: [address]    AAA/T Program Assumption of Risk Form signed by Patient/ or Parent Legal Guardian yes  Patient is free of allergies or sever asthma yes  Patient reports no fear of animals yes  Patient reports no history of cruelty to animals yes  Patient understands his/her participation is voluntary yes  Patient washes hands before animal contact yes  Patient washes hands after animal contact yes  Behavioral Response: Engaged, Appropriate   Education: Contractor, Appropriate Animal Interaction   Education Outcome: Acknowledges education.   Clinical Observations/Feedback: Patient pet therapy dog appropriately, asked appropriate questions about therapy dog and his training and shared stories about his pet at home.   Laureen Ochs Sina Sumpter, LRT/CTRS  Natsha Guidry L 08/05/2014 4:25 PM

## 2014-08-06 NOTE — BHH Group Notes (Signed)
   [facility] LCSW Aftercare Discharge Planning Group Note  08/06/2014  8:45 AM   Participation Quality: Alert, Appropriate and Oriented  Mood/Affect: Appropriate  Depression Rating: 6/7  Anxiety Rating: 8  Thoughts of Suicide: Pt denies SI/HI  Will you contract for safety? Yes  Current AVH: Pt denies  Plan for Discharge/Comments: Pt attended discharge planning group and actively participated in group. CSW provided pt with today's workbook. Patient reports that he is "doing a little better today" and believes that his medications are helping with his symptoms. He is agreeable to discharging directly to [facility] Monday morning, stating "I have no where to else to go."  Transportation Means: CSW continuing to assess.  Supports: No supports mentioned at this time  Tilden Fossa, MSW, [place] Worker [facility] [phone]

## 2014-08-06 NOTE — Progress Notes (Signed)
[facility] Adult Case Management Discharge Plan :  Will you be returning to the same living situation after discharge: No, patient plans to continue treatment at [facility] At discharge, do you have transportation home?:Yes,  patient will be provided with a bus pass if needed Do you have the ability to pay for your medications:Yes,  patient will be provided with medication samples and prescriptions at discharge  Release of information consent forms completed and in the chart;  Patient's signature needed at discharge.  Patient to Follow up at: Follow-up Information    Follow up with [facility] On 08/12/2014.   Specialty:  Behavioral Health   Why:  Appointment on Thursday, January 14th at 2 pm with Aldean Jewett. Please call office if you need to reschedule.   Contact information:   [place] [address] [phone]       Follow up with [facility] On 08/09/2014.   Why:  Please present to [facility] for continued treatment on Monday, January 11th by 8 am. Please call office if you need to reschedule.   Contact information:   [address] A[place] Rump[place], [place] [address]  [phone]      Patient denies SI/HI:   Yes,  denies    Safety Planning and Suicide Prevention discussed:  Yes,  with patient  Patient refused referral  Pete Merten, Casimiro Needle 08/06/2014, 10:17 AM

## 2014-08-06 NOTE — Progress Notes (Signed)
Adult Psychoeducational Group Note  Date:  08/06/2014 Time:  4:07 PM  Group Topic/Focus:  Goals Group:   The focus of this group is to help patients establish daily goals to achieve during treatment and discuss how the patient can incorporate goal setting into their daily lives to aide in recovery.  Participation Level:  Active  Participation Quality:  Appropriate  Affect:  Appropriate  Cognitive:  Appropriate  Insight: Appropriate  Engagement in Group:  Engaged  Modes of Intervention:  Discussion  Additional Comments:   Elise Benne 08/06/2014, 4:07 PM

## 2014-08-06 NOTE — BHH Group Notes (Signed)
[place] LCSW Group Therapy 08/06/2014 1:15 PM Type of Therapy: Group Therapy Participation Level: Active  Participation Quality: Attentive, Sharing and Supportive  Affect: Appropriate  Cognitive: Alert and Oriented  Insight: Developing/Improving and Engaged  Engagement in Therapy: Developing/Improving and Engaged  Modes of Intervention: Clarification, Confrontation, Discussion, Education, Exploration, Limit-setting, Orientation, Problem-solving, Rapport Building, Art therapist, Socialization and Support  Summary of Progress/Problems: The topic for today was feelings about relapse. Pt discussed what relapse prevention is to them and identified triggers that they are on the path to relapse. Pt processed their feeling towards relapse and was able to relate to peers. Pt discussed coping skills that can be used for relapse prevention. Patient identified his relapse behavior as drinking. He discussed his goal to go to residential treatment then transition to a half-way house. Patient reports that he needs to go to [employer] as well as get a sponsor. CSW's and other group members provided emotional support and encouragement to patient.   Tilden Fossa, MSW, [place] Worker [facility] [phone]

## 2014-08-06 NOTE — Progress Notes (Signed)
Patient did attend the evening karaoke group.  

## 2014-08-06 NOTE — Clinical Social Work Note (Signed)
CSW informed by [facility] that patient may not be eligible for admission due to patient's Medicaid showing up in [place] as [place] staff member reports that she will speak with director regarding discrepancy and contact CSW back.  CSW confirmed this with financial assistance staff member who verified that patient's insurance is classified in system as [place] spoke with patient regarding his medicaid. Patient states that he had the medicaid changed to [facility]. Patient was able to retrieve his medicaid card from locker which showed a [facility] address. CSW made a copy of card and faxed to [facility] to review.   MD and NP not available to discuss with at this time, CSW updated RN.   Tilden Fossa, MSW, [place] Worker [facility] [phone]

## 2014-08-06 NOTE — Progress Notes (Signed)
[facility] MD Progress Note  08/06/2014 2:09 PM Samuel Reyes  MRN:  [id number] Subjective:  Expresses some concerns about the possibility of relapse. States he is afraid. Does not want to continue this cycle. States that right now he does not have the support that would help him abstain. He is excited about being able to go to [facility] Monday morning as sates he does not see himself as being able to make it otherwise. He is concerned about his ability to stay sober if he was D/C before the Monday appointment Diagnosis:   DSM5: Trauma-Stressor Disorders:  Posttraumatic Stress Disorder (309.81) Substance/Addictive Disorders:  Alcohol Related Disorder - Severe (303.90) Depressive Disorders:  Major Depressive Disorder - Severe (296.23) Total Time spent with patient: 30 minutes  Axis I: Substance Induced Mood Disorder  ADL's:  Intact  Sleep: better  Appetite:  Fair   Psychiatric Specialty Exam: Physical Exam  Review of Systems  Constitutional: Negative.   HENT: Negative.   Eyes: Negative.   Respiratory: Negative.   Cardiovascular: Negative.   Gastrointestinal: Negative.   Genitourinary: Negative.   Musculoskeletal: Negative.   Skin: Negative.   Neurological: Negative.   Endo/Heme/Allergies: Negative.   Psychiatric/Behavioral: Positive for depression and substance abuse. The patient is nervous/anxious.     Blood pressure 138/74, pulse 66, temperature 97.7 F (36.5 C), temperature source Oral, resp. rate 20, height 5' 3.5" (1.613 m), weight 70.308 kg (155 lb), SpO2 99 %.Body mass index is 27.02 kg/(m^2).  General Appearance: Fairly Groomed  Engineer, water::  Fair  Speech:  Clear and Coherent  Volume:  Decreased  Mood:  Anxious and worried  Affect:  anxious worried  Thought Process:  Coherent and Goal Directed  Orientation:  Full (Time, Place, and Person)  Thought Content:  symptoms events worries concerns, afraid of relapsing  Suicidal Thoughts:  No  Homicidal Thoughts:  No  Memory:   Immediate;   Fair Recent;   Fair Remote;   Fair  Judgement:  Fair  Insight:  Present  Psychomotor Activity:  Restlessness  Concentration:  Fair  Recall:  [employer] of Knowledge:Fair  Language: Fair  Akathisia:  No  Handed:  Right  AIMS (if indicated):     Assets:  Desire for Improvement  Sleep:  Number of Hours: 6.75   Musculoskeletal: Strength & Muscle Tone: within normal limits Gait & Station: normal Patient leans: N/A  Current Medications: Current Facility-Administered Medications  Medication Dose Route Frequency Provider Last Rate Last Dose  . acamprosate (CAMPRAL) tablet 666 mg  666 mg Oral TID WC Nicholaus Bloom, MD   666 mg at 08/06/14 1158  . acetaminophen (TYLENOL) tablet 650 mg  650 mg Oral Q6H PRN Benjamine Mola, FNP   650 mg at 08/05/14 6160  . alum & mag hydroxide-simeth (MAALOX/MYLANTA) 200-200-20 MG/5ML suspension 30 mL  30 mL Oral Q4H PRN Benjamine Mola, FNP      . ARIPiprazole (ABILIFY) tablet 5 mg  5 mg Oral Daily Benjamine Mola, FNP   5 mg at 08/06/14 0810  . famotidine (PEPCID) tablet 20 mg  20 mg Oral BID Benjamine Mola, FNP   20 mg at 08/06/14 0810  . FLUoxetine (PROZAC) capsule 80 mg  80 mg Oral Daily Benjamine Mola, FNP   80 mg at 08/06/14 0810  . gabapentin (NEURONTIN) capsule 300 mg  300 mg Oral BID Benjamine Mola, FNP   300 mg at 08/06/14 0810  . Ipratropium-Albuterol (COMBIVENT) respimat 2 puff  2  puff Inhalation BID Nicholaus Bloom, MD   2 puff at 08/06/14 [phone]  . magnesium hydroxide (MILK OF MAGNESIA) suspension 30 mL  30 mL Oral Daily PRN Benjamine Mola, FNP      . metoprolol tartrate (LOPRESSOR) tablet 25 mg  25 mg Oral BID Benjamine Mola, FNP   25 mg at 08/06/14 3570  . multivitamin with minerals tablet 1 tablet  1 tablet Oral Daily Benjamine Mola, FNP   1 tablet at 08/06/14 0810  . naproxen (NAPROSYN) tablet 500 mg  500 mg Oral BID WC Benjamine Mola, FNP   500 mg at 08/06/14 0810  . nicotine (NICODERM CQ - dosed in mg/24 hours) patch 21 mg  21 mg  Transdermal Daily Benjamine Mola, FNP   21 mg at 08/06/14 1779  . thiamine (B-1) injection 100 mg  100 mg Intramuscular Once Benjamine Mola, FNP   100 mg at 08/01/14 1513  . thiamine (VITAMIN B-1) tablet 100 mg  100 mg Oral Daily Benjamine Mola, FNP   100 mg at 08/06/14 0810  . traZODone (DESYREL) tablet 300 mg  300 mg Oral QHS Nicholaus Bloom, MD   300 mg at 08/05/14 2152    Lab Results: No results found for this or any previous visit (from the past 48 hour(s)).  Physical Findings: AIMS: Facial and Oral Movements Muscles of Facial Expression: None, normal Lips and Perioral Area: None, normal Jaw: None, normal Tongue: None, normal,Extremity Movements Upper (arms, wrists, hands, fingers): None, normal Lower (legs, knees, ankles, toes): None, normal, Trunk Movements Neck, shoulders, hips: None, normal, Overall Severity Severity of abnormal movements (highest score from questions above): None, normal Incapacitation due to abnormal movements: None, normal Patient's awareness of abnormal movements (rate only patient's report): No Awareness, Dental Status Current problems with teeth and/or dentures?: No Does patient usually wear dentures?: No  CIWA:  CIWA-Ar Total: 0 COWS:  COWS Total Score: 2  Treatment Plan Summary: Daily contact with patient to assess and evaluate symptoms and progress in treatment Medication management  Plan: Supportive approach/coping skills/relapse prevention           Depression: his mood is still depressed anxious worried about a possible relapse if he was to be D/C before he has the rehab bed (08/09/2013) Will continue the medications at the same dosages and help deal with the symptoms using CBT/mindfulness. Continue the grief work            Anxiety: as above             Alcohol Dependence: given that he is chronic relapser without any significant period of sobriety, will go ahead and continue to work with him during the weekend and have him ready to be admitted to  [facility] Monday AM ( door to door) to improve the chances of success Medical Decision Making Problem Points:  Review of psycho-social stressors (1) Data Points:  Review of medication regiment & side effects (2)  I certify that inpatient services furnished can reasonably be expected to improve the patient's condition.   Timothea Bodenheimer A 08/06/2014, 2:09 PM

## 2014-08-06 NOTE — BHH Group Notes (Signed)
Adult Psychoeducational Group Note  Date:  08/06/2014 Time:  11:41 PM  Group Topic/Focus:  AA Meeting  Participation Level:  Minimal  Participation Quality:  Attentive  Affect:  Flat  Cognitive:  Alert  Insight: Limited  Engagement in Group:  Limited  Modes of Intervention:  Discussion and Education  Additional Comments:  Samuel Reyes attended group.  [name] A 08/06/2014, 11:41 PM

## 2014-08-06 NOTE — Tx Team (Signed)
Interdisciplinary Treatment Plan Update (Adult) Date: 08/06/2014   Time Reviewed: 9:30 AM  Progress in Treatment: Attending groups: Yes Participating in groups: Yes Taking medication as prescribed: Yes Tolerating medication: Yes Family/Significant other contact made: No, patient has declined for CSW to make contact with family Patient understands diagnosis: Yes Discussing patient identified problems/goals with staff: Yes Medical problems stabilized or resolved: Yes Denies suicidal/homicidal ideation: Yes Issues/concerns per patient self-inventory: Yes Other:  New problem(s) identified: N/A  Discharge Plan or Barriers:  08/03/14: Continuing to assess 1/8: Patient has been accepted to [facility] on 1/11, planned discharge to [facility] on Monday at 8 am due to chronic hospitalizations  Reason for Continuation of Hospitalization:  Depression Anxiety Medication Stabilization   Comments: N/A  Estimated length of stay: 2-3 days  For review of initial/current patient goals, please see plan of care. Patient is a 59 year old Caucasian Male with a diagnosis of Alcohol Abuse, Major Depression, single episode and Substance Induced Mood Disorder. Patient lives in [place] alone. Patient will benefit from crisis stabilization, medication evaluation, group therapy, and psycho education in addition to case management for discharge planning. Patient and CSW reviewed pt's identified goals and treatment plan. Pt verbalized understanding and agreed to treatment plan.  Attendees: Patient:    Family:    Physician: Dr. Parke Poisson; Dr. Sabra Heck 08/06/2014 9:30 AM  Nursing: Eduard Roux, Para March , RN 08/06/2014 9:30 AM  Clinical Social Worker: Tilden Fossa, LCSWA 08/06/2014 9:30 AM  Other: Joette Catching, LCSW 08/06/2014 9:30 AM  Other: Lucinda Dell, [place] Liaison 08/06/2014 9:30 AM  Other: Lars Pinks, Case Manager 08/06/2014 9:30 AM  Other: Agustina Caroli, NP 08/06/2014 9:30 AM  Other: Elmarie Shiley, NP 08/06/2014 9:30 AM  Other: Norberto Sorenson, [place] 08/06/2014 9:30 AM  Other:      Scribe for Treatment Team:  Tilden Fossa, MSW, [employer] [phone]

## 2014-08-06 NOTE — Progress Notes (Signed)
D: Patient has anxious mood and affect. He reported on the self inventory sheet that he's sleeping fair at night, good appetite and ability to pay attention and low energy level. Patient rates depression/feelings of hopelessness "6" and anxiety "7". He's actively participating in groups, conversing with peers in the dayroom and visible in the milieu. Patient voiced that he's doing a lot better and feels like he's on the right medications and looks forward to going to [facility] on Monday. Adheres to medication regimen.  A: Support and encouragement provided to patient. Administered medications per ordering MD. Monitor Q15 minute checks for safety.  R: Patient receptive. Denies SI/HI and AVH. Patient remains safe on the unit.

## 2014-08-07 ENCOUNTER — Encounter (HOSPITAL_COMMUNITY): Payer: Self-pay | Admitting: Registered Nurse

## 2014-08-07 DIAGNOSIS — F332 Major depressive disorder, recurrent severe without psychotic features: Principal | ICD-10-CM

## 2014-08-07 DIAGNOSIS — F4310 Post-traumatic stress disorder, unspecified: Secondary | ICD-10-CM

## 2014-08-07 NOTE — Progress Notes (Signed)
Psychoeducational Group Note  Date: 08/07/2014 Time:  1015  Group Topic/Focus:  Identifying Needs:   The focus of this group is to help patients identify their personal needs that have been historically problematic and identify healthy behaviors to address their needs.  Participation Level:  Active  Participation Quality:  Appropriate  Affect:  Appropriate  Cognitive:  Oriented  Insight:  Improving  Engagement in Group:  Engaged  Additional Comments:  Pt participated and was involved in the discussion.  Roniesha Hollingshead A 

## 2014-08-07 NOTE — Progress Notes (Signed)
[facility] MD Progress Note  08/07/2014 3:44 PM Koury Roddy  MRN:  [id number]   Subjective:  Patient states that he was feeling better "I was feeling better until this stuff with my Medicaid card.  If I can't get into [facility] on Monday it is going to be really disappointing."  Patient denies suicidal/homicidal ideation at this time; patient also denies psychosis, and paranoia.  Patient states that he is tolerating medications with out adverse affects.     Diagnosis:   DSM5: Trauma-Stressor Disorders:  Posttraumatic Stress Disorder (309.81) Substance/Addictive Disorders:  Alcohol Related Disorder - Severe (303.90) Depressive Disorders:  Major Depressive Disorder - Severe (296.23) Total Time spent with patient: 30 minutes  Axis I: Substance Induced Mood Disorder  ADL's:  Intact  Sleep: Improving  Appetite:  Fair   Psychiatric Specialty Exam: Physical Exam  Constitutional: He is oriented to person, place, and time.  HENT:  Head: Normocephalic.  Neck: Normal range of motion.  Respiratory: Effort normal.  Musculoskeletal: Normal range of motion.  Neurological: He is alert and oriented to person, place, and time.  Skin: Skin is warm and dry.  Psychiatric: His behavior is normal. His mood appears anxious. Thought content is not paranoid and not delusional. Cognition and memory are normal. He exhibits a depressed mood. He expresses no homicidal and no suicidal ideation.    Review of Systems  Cardiovascular: Negative.   Psychiatric/Behavioral: Positive for depression (Improving) and substance abuse. Negative for suicidal ideas. The patient is nervous/anxious (Improving).        Patient states that he does have some anxiety related to if he will be able to go to [facility] on Monday   All other systems reviewed and are negative.   Blood pressure 155/82, pulse 64, temperature 98 F (36.7 C), temperature source Oral, resp. rate 20, height 5' 3.5" (1.613 m), weight 70.308 kg (155 lb), SpO2 99  %.Body mass index is 27.02 kg/(m^2).  General Appearance: Fairly Groomed  Engineer, water::  Fair  Speech:  Clear and Coherent  Volume:  Normal  Mood:  Anxious, Depressed and worried  Affect:  anxious worried  Thought Process:  Circumstantial, Coherent and Goal Directed  Orientation:  Full (Time, Place, and Person)  Thought Content:  Worried he won't get into [facility] related to his Medicaid   Suicidal Thoughts:  No  Homicidal Thoughts:  No  Memory:  Immediate;   Good Recent;   Good Remote;   Good  Judgement:  Fair  Insight:  Present  Psychomotor Activity:  Restlessness  Concentration:  Fair  Recall:  Good  Fund of Knowledge:Fair  Language: Good  Akathisia:  No  Handed:  Right  AIMS (if indicated):     Assets:  Communication Skills Desire for Improvement  Sleep:  Number of Hours: 5   Musculoskeletal: Strength & Muscle Tone: within normal limits Gait & Station: normal Patient leans: N/A  Current Medications: Current Facility-Administered Medications  Medication Dose Route Frequency Provider Last Rate Last Dose  . acamprosate (CAMPRAL) tablet 666 mg  666 mg Oral TID WC Nicholaus Bloom, MD   666 mg at 08/07/14 1210  . acetaminophen (TYLENOL) tablet 650 mg  650 mg Oral Q6H PRN Benjamine Mola, FNP   650 mg at 08/07/14 1315  . alum & mag hydroxide-simeth (MAALOX/MYLANTA) 200-200-20 MG/5ML suspension 30 mL  30 mL Oral Q4H PRN Benjamine Mola, FNP      . ARIPiprazole (ABILIFY) tablet 5 mg  5 mg Oral Daily Benjamine Mola,  FNP   5 mg at 08/07/14 0818  . famotidine (PEPCID) tablet 20 mg  20 mg Oral BID Benjamine Mola, FNP   20 mg at 08/07/14 0817  . FLUoxetine (PROZAC) capsule 80 mg  80 mg Oral Daily Benjamine Mola, FNP   80 mg at 08/07/14 0817  . gabapentin (NEURONTIN) capsule 300 mg  300 mg Oral BID Benjamine Mola, FNP   300 mg at 08/07/14 0817  . Ipratropium-Albuterol (COMBIVENT) respimat 2 puff  2 puff Inhalation BID Nicholaus Bloom, MD   2 puff at 08/07/14 0800  . magnesium hydroxide  (MILK OF MAGNESIA) suspension 30 mL  30 mL Oral Daily PRN Benjamine Mola, FNP      . metoprolol tartrate (LOPRESSOR) tablet 25 mg  25 mg Oral BID Benjamine Mola, FNP   25 mg at 08/07/14 1497  . multivitamin with minerals tablet 1 tablet  1 tablet Oral Daily Benjamine Mola, FNP   1 tablet at 08/07/14 0818  . naproxen (NAPROSYN) tablet 500 mg  500 mg Oral BID WC Benjamine Mola, FNP   500 mg at 08/07/14 0817  . nicotine (NICODERM CQ - dosed in mg/24 hours) patch 21 mg  21 mg Transdermal Daily Benjamine Mola, FNP   21 mg at 08/07/14 0818  . thiamine (B-1) injection 100 mg  100 mg Intramuscular Once Benjamine Mola, FNP   100 mg at 08/01/14 1513  . thiamine (VITAMIN B-1) tablet 100 mg  100 mg Oral Daily Benjamine Mola, FNP   100 mg at 08/07/14 0817  . traZODone (DESYREL) tablet 300 mg  300 mg Oral QHS Nicholaus Bloom, MD   300 mg at 08/06/14 2130    Lab Results: No results found for this or any previous visit (from the past 48 hour(s)).  Physical Findings: AIMS: Facial and Oral Movements Muscles of Facial Expression: None, normal Lips and Perioral Area: None, normal Jaw: None, normal Tongue: None, normal,Extremity Movements Upper (arms, wrists, hands, fingers): None, normal Lower (legs, knees, ankles, toes): None, normal, Trunk Movements Neck, shoulders, hips: None, normal, Overall Severity Severity of abnormal movements (highest score from questions above): None, normal Incapacitation due to abnormal movements: None, normal Patient's awareness of abnormal movements (rate only patient's report): No Awareness, Dental Status Current problems with teeth and/or dentures?: No Does patient usually wear dentures?: No  CIWA:  CIWA-Ar Total: 1 COWS:  COWS Total Score: 2  Treatment Plan Summary: Daily contact with patient to assess and evaluate symptoms and progress in treatment Medication management  Plan: Supportive approach/coping skills/relapse prevention           Depression: his mood is still  depressed anxious worried about a possible relapse if he was to be D/C before he has the rehab bed (08/09/2013) Will continue the medications at the same dosages and help deal with the symptoms using CBT/mindfulness. Continue the grief work            Anxiety: as above             Alcohol Dependence: given that he is chronic relapse without any significant period of sobriety, will go ahead and continue to work with him during the weekend and have him ready to be admitted to [facility] Monday AM ( door to door) to improve the chances of success   Will continue with current plan of treatment  Medical Decision Making Problem Points:  Established problem, stable/improving (1), Review of last therapy session (  1) and Review of psycho-social stressors (1) Data Points:  Review or order clinical lab tests (1) Review of medication regiment & side effects (2)  I certify that inpatient services furnished can reasonably be expected to improve the patient's condition.   Earleen Newport, FNP-BC 08/07/2014, 3:44 PM    Patient ID: Adonus Uselman, male   DOB: [birth year], 59 y.o.   MRN: [id number]

## 2014-08-07 NOTE — Progress Notes (Signed)
D. Pt had been up and visible in milieu this evening, did attend evening group activity. Pt spoke about how he is suppose to be discharged Monday morning. Pt did receive all medications without incident, does not display any overt signs or symptoms of withdrawal and did not verbalize any complaints of pain this evening. A. Support and encouragement provided. R. Safety maintained, will continue to monitor.

## 2014-08-07 NOTE — Progress Notes (Signed)
.  Psychoeducational Group Note    Date: 08/07/2014 Time: 0930   Goal Setting Purpose of Group: To be able to set Reyes goal that is measurable and that can be accomplished in one day Participation Level:  Active  Participation Quality:  Appropriate  Affect:  Appropriate  Cognitive:  Oriented  Insight:  Improving  Engagement in Group:  Engaged  Additional Comments:  Pt was engaged and involved in the group. Participation good.  Samuel Reyes  

## 2014-08-07 NOTE — Progress Notes (Signed)
Samuel Reyes has been out in the milieu today..he is seen interacting with his peers and he tolerates this well. He is pleasant and cooperative and he takes his meds as scheduled.   A He  Completes  his morning assessment and on it he writes he denies active SI within the past  24 hrs and he rates his depression, hopelessness and anxiety "8/8/9", respectively.He is attending his groups as planned and is engaged in his sobriety.   R Safety is in place.

## 2014-08-07 NOTE — Social Work (Signed)
CSW provided patient with directions for using the bus at discharge. Also placed bus passes on shadow chart, with an additional copy of bus directions.  Christene Lye MSW, LCSW

## 2014-08-08 MED ORDER — ACAMPROSATE CALCIUM 333 MG PO TBEC: 666.0000 mg | DELAYED_RELEASE_TABLET | Freq: Three times a day (TID) | ORAL | Status: DC

## 2014-08-08 MED ORDER — METHOCARBAMOL 500 MG PO TABS
500.0000 mg | ORAL_TABLET | Freq: Three times a day (TID) | ORAL | Status: DC | PRN
  Administered 2014-08-08 (×2): 500 mg via ORAL
  Filled 2014-08-08 (×2): qty 1

## 2014-08-08 MED ORDER — FLUOXETINE HCL 40 MG PO CAPS: 80.0000 mg | ORAL_CAPSULE | Freq: Every day | ORAL | Status: DC

## 2014-08-08 MED ORDER — TRAZODONE HCL 300 MG PO TABS: 300.0000 mg | ORAL_TABLET | Freq: Every day | ORAL | Status: DC

## 2014-08-08 MED ORDER — TRAZODONE HCL 150 MG PO TABS: 300.0000 mg | ORAL_TABLET | Freq: Every day | ORAL | Status: DC

## 2014-08-08 MED ORDER — GABAPENTIN 300 MG PO CAPS: 300.0000 mg | ORAL_CAPSULE | Freq: Two times a day (BID) | ORAL | Status: DC

## 2014-08-08 MED ORDER — ARIPIPRAZOLE 5 MG PO TABS: 5.0000 mg | ORAL_TABLET | Freq: Every day | ORAL | Status: DC

## 2014-08-08 MED ORDER — FAMOTIDINE 20 MG PO TABS: 20.0000 mg | ORAL_TABLET | Freq: Two times a day (BID) | ORAL | Status: DC

## 2014-08-08 NOTE — Progress Notes (Signed)
Psychoeducational Group Note  Date:  08/08/2014 Time:  1015  Group Topic/Focus:  Making Healthy Choices:   The focus of this group is to help patients identify negative/unhealthy choices they were using prior to admission and identify positive/healthier coping strategies to replace them upon discharge.  Participation Level:  Active  Participation Quality:  Appropriate  Affect:  Appropriate  Cognitive:  Alert  Insight:  Improving  Engagement in Group:  Engaged  Additional Comments:  Pt participated and was focused on the topics presented. Gaining insight and looking into his behavior  Samuel Reyes 08/08/2014

## 2014-08-08 NOTE — Discharge Summary (Signed)
Physician Discharge Summary Note  Patient:  Samuel Reyes is an 59 y.o., male MRN:  [id number] DOB:  Oct 06, 1955 Patient phone:  [phone] (home)  Patient address:   [place] [address],  Total Time spent with patient: Greater than 30 minutes  Date of Admission:  08/01/2014 Date of Discharge: 08/09/2014  Reason for Admission:  59 Y/O male with multiple admissions to our services. Last time he was here 3 months ago went to [facility]. States that he staid there for couple of months and was asked to leave as he was drinking. He staid with his mother but he was asked to leave due to the same reason. He is drinking every day. Last week 7 18 pack of beer and tow half gallons of Vodka. States he has experienced increased depression as starts remembering his sons who died one with spina bifida another one due to an asthma attack and his wife three years ago. States he moved here from [place] as he has lost everything when his wife died. He admits that when things get to this point he starts thinking about killing himself. States he recently overdosed on some of his medications  Discharge Diagnoses: Active Problems:   Bereavement   Alcohol dependence   Major depression, recurrent   PTSD (post-traumatic stress disorder)   Alcohol dependence with uncomplicated withdrawal   Major depressive disorder, recurrent, severe without psychotic features   Psychiatric Specialty Exam:  See Suicide Risk Assessment  Physical Exam  Constitutional: He is oriented to person, place, and time. He appears well-developed.  HENT:  Head: Normocephalic.  Neck: Normal range of motion.  Respiratory: Effort normal.  Musculoskeletal: Normal range of motion.  Complaints of neck pain relieved with robaxin   Neurological: He is alert and oriented to person, place, and time.  Skin: Skin is warm and dry.    Review of Systems  Musculoskeletal: Positive for neck pain (Relieved with Robaxin).   Psychiatric/Behavioral: Positive for substance abuse. Negative for suicidal ideas. Hallucinations: Denies. Memory loss: denies. Nervous/anxious: Denies. Insomnia: Stable.   All other systems reviewed and are negative.   Blood pressure 121/73, pulse 79, temperature 98.1 F (36.7 C), temperature source Oral, resp. rate 20, height 5' 3.5" (1.613 m), weight 70.308 kg (155 lb), SpO2 99 %.Body mass index is 27.02 kg/(m^2).   Past Psychiatric History: Past Psychiatric History: Diagnosis:Alcohol abuse; Substance induced mood disorder  Hospitalizations: [facility]  Outpatient Care: [facility]   Substance Abuse Care: Denies  Self-Mutilation: Yes   Suicidal Attempts: yes cut his wrist and couple of months ago "ate some pills"   Violent Behaviors: Denies     Musculoskeletal: Strength & Muscle Tone: within normal limits Gait & Station: normal Patient leans: N/A  DSM5:  Schizophrenia Disorders:  N/A Obsessive-Compulsive Disorders:  N/A Trauma-Stressor Disorders:  Posttraumatic Stress Disorder (309.81) Substance/Addictive Disorders:  Alcohol Related Disorder - Severe (303.90) Depressive Disorders:  Major Depressive Disorder - Severe (296.23)  Axis Diagnosis:   AXIS I:  Alcohol Abuse, Major Depression, Recurrent severe and Substance Induced Mood Disorder AXIS II:  Deferred AXIS III:   Past Medical History  Diagnosis Date  . Cirrhosis   . Degenerative joint disease   . Hypertension   . Gastric ulcer   . Depression   . Asthma   . Mitral valve prolapse 2002  . H/O hiatal hernia   . COPD (chronic obstructive pulmonary disease)   . TIA (transient ischemic attack)     2010  . Stroke  TIA - 2010 - no deficits   . Shortness of breath     with exertion   . GERD (gastroesophageal reflux disease)   . Headache(784.0)   . Hiatal hernia 1982  . Degenerative joint disease   . Anxiety    AXIS IV:  other psychosocial or environmental problems AXIS V:  61-70 mild symptoms  Level of  Care:  OP  Hospital Course:    Samuel Reyes was admitted forETOH USE DISORDER and crisis management.  He was treated with Ativan detox protocol; Vistaril for anxiety; Trazodone for insomnia Prozac for depression.  Medical problems were identified and treated.  Home medications were restarted as appropriate.  Improvement was monitored by observation and Scherrie Merritts daily report of symptom reduction.  Emotional and mental status was monitored daily by self inventory reports completed by the Scherrie Merritts and clinical staff.         Samuel Reyes was evaluated by the treatment team for stability and plans for continued recovery upon discharge.  He was offered further treatment options upon discharge including Residential, Intensive Outpatient and Outpatient treatment. She will follow up with [facility] for rehab Monday 08/09/2014; and [facility] for medication management and counseling.     Samuel Reyes motivation was an integral factor for scheduling further treatment.  Employment, transportation, bed availability, health status, family support, and any pending legal issues were also considered during his hospital stay.  Upon completion of this admission the patient was both and mentally and medically stable for discharge denying suicidal/homicidal ideation, auditory/visual/tactile hallucinations, delusional thoughts and paranoia.       Consults:  psychiatry  Significant Diagnostic Studies:  labs: ETOH, UDS, Urinalysis, CBC/Diff, DMET  Discharge Vitals:   Blood pressure 121/73, pulse 79, temperature 98.1 F (36.7 C), temperature source Oral, resp. rate 20, height 5' 3.5" (1.613 m), weight 70.308 kg (155 lb), SpO2 99 %. Body mass index is 27.02 kg/(m^2). Lab Results:   No results found for this or any previous visit (from the past 72 hour(s)).  Physical Findings: AIMS: Facial and Oral Movements Muscles of Facial Expression: None, normal Lips and Perioral Area: None, normal Jaw: None,  normal Tongue: None, normal,Extremity Movements Upper (arms, wrists, hands, fingers): None, normal Lower (legs, knees, ankles, toes): None, normal, Trunk Movements Neck, shoulders, hips: None, normal, Overall Severity Severity of abnormal movements (highest score from questions above): None, normal Incapacitation due to abnormal movements: None, normal Patient's awareness of abnormal movements (rate only patient's report): No Awareness, Dental Status Current problems with teeth and/or dentures?: No Does patient usually wear dentures?: No  CIWA:  CIWA-Ar Total: 3 COWS:  COWS Total Score: 2  Psychiatric Specialty Exam: See Psychiatric Specialty Exam and Suicide Risk Assessment completed by Attending Physician prior to discharge.  Discharge destination:  Other:  [facility]  Is patient on multiple antipsychotic therapies at discharge:  No   Has Patient had three or more failed trials of antipsychotic monotherapy by history:  No  Recommended Plan for Multiple Antipsychotic Therapies: NA  Discharge Instructions    Discharge instructions    Complete by:  As directed   Take all of you medications as prescribed by your mental healthcare provider.  Report any adverse effects and reactions from your medications to your outpatient provider promptly. Do not engage in alcohol and or illegal drug use while on prescription medicines. In the event of worsening symptoms call the crisis hotline, 911, and or go to the nearest emergency department for appropriate evaluation and treatment  of symptoms. Follow-up with your primary care provider for your medical issues, concerns and or health care needs.   Keep all scheduled appointments.  If you are unable to keep an appointment call to reschedule.  Let the nurse know if you will need medications before next scheduled appointment.            Medication List    STOP taking these medications        acetaminophen 325 MG tablet  Commonly known as:   TYLENOL     folic acid 1 MG tablet  Commonly known as:  FOLVITE     omeprazole 20 MG capsule  Commonly known as:  PRILOSEC     thiamine 100 MG tablet      TAKE these medications      Indication   acamprosate 333 MG tablet  Commonly known as:  CAMPRAL  Take 2 tablets (666 mg total) by mouth 3 (three) times daily with meals. For alcoholism   Indication:  Excessive Use of Alcohol     albuterol 108 (90 BASE) MCG/ACT inhaler  Commonly known as:  PROVENTIL HFA;VENTOLIN HFA  Inhale 2 puffs into the lungs every 6 (six) hours as needed for wheezing or shortness of breath.   Indication:  Asthma     albuterol-ipratropium 18-103 MCG/ACT inhaler  Commonly known as:  COMBIVENT  Inhale 2 puffs into the lungs 2 (two) times daily. For shortness of breath   Indication:  Disease involving Spasms of the Lungs     ARIPiprazole 5 MG tablet  Commonly known as:  ABILIFY  Take 5 mg by mouth daily.      famotidine 20 MG tablet  Commonly known as:  PEPCID  Take 1 tablet (20 mg total) by mouth 2 (two) times daily.      FLUoxetine 40 MG capsule  Commonly known as:  PROZAC  Take 2 capsules (80 mg total) by mouth daily. For depression   Indication:  Major Depressive Disorder     gabapentin 300 MG capsule  Commonly known as:  NEURONTIN  Take 1 capsule (300 mg total) by mouth 2 (two) times daily. For substance withdrawal syndrome   Indication:  Substance withdrawal syndrome     metoprolol tartrate 25 MG tablet  Commonly known as:  LOPRESSOR  Take 1 tablet (25 mg total) by mouth 2 (two) times daily. For high blood pressure   Indication:  High Blood Pressure     naproxen 500 MG tablet  Commonly known as:  NAPROSYN  Take 500 mg by mouth 2 (two) times daily with a meal.      nicotine 21 mg/24hr patch  Commonly known as:  NICODERM CQ - dosed in mg/24 hours  Place 21 mg onto the skin daily.      traZODone 150 MG tablet  Commonly known as:  DESYREL  Take 2 tablets (300 mg total) by mouth at  bedtime. For sleep   Indication:  Trouble Sleeping           Follow-up Information    Follow up with [facility] On 08/12/2014.   Specialty:  Behavioral Health   Why:  Appointment on Thursday, January 14th at 2 pm with Aldean Jewett. Please call office if you need to reschedule.   Contact information:   [place] [address] [phone]       Follow up with [facility] On 08/09/2014.   Why:  Please present to [facility] for continued treatment on Monday, January  11th by 8 am. Please call office if you need to reschedule.   Contact information:   [address] A[place]ha Rumpf [place], [place] [address]  [phone]      Follow-up recommendations:  Activity:  As tolerated Diet:  As tolerated Other:  Follow up with [facility] for rehab and [facility] for medication management  Comments:    Patient has been instructed to  Take medications as prescribed.  Report adverse effects to outpatient provider.  Follow up with primary doctor for any medical issues.  If symptoms recur report to nearest emergency or crisis hot line.    Total Discharge Time:  Greater than 30 minutes.  SignedEarleen Newport, FNP-BC 08/08/2014, 2:47 PMStable

## 2014-08-08 NOTE — Progress Notes (Signed)
Patient did attend the evening speaker AA meeting.  

## 2014-08-08 NOTE — Progress Notes (Signed)
Patient ID: Samuel Reyes, male   DOB: Feb 05, 1956, 59 y.o.   MRN: [id number] [facility] MD Progress Note  08/08/2014 2:34 PM [name]  MRN:  [id number]   Subjective:  Patient chart reviewed and patient discussed with nursing staff.  Patient states "I feel much better except for the neck pain that I was having.  That new pill is helping; it feels better."  Patient states that he is eating/sleeping/ and tolerating medications without adverse affects.  Patient denies suicidal/homicidal ideation, psychosis, and paranoia.  Patient is excited about going to [facility] for rehab Monday morning.      Diagnosis:   DSM5: Trauma-Stressor Disorders:  Posttraumatic Stress Disorder (309.81) Substance/Addictive Disorders:  Alcohol Related Disorder - Severe (303.90) Depressive Disorders:  Major Depressive Disorder - Severe (296.23) Total Time spent with patient: 30 minutes  Axis I: Substance Induced Mood Disorder  ADL's:  Intact  Sleep: Good  Appetite:  Good   Psychiatric Specialty Exam: Physical Exam  Constitutional: He is oriented to person, place, and time.  HENT:  Head: Normocephalic.  Neck: Normal range of motion.  Respiratory: Effort normal.  Musculoskeletal: Normal range of motion.  Complaints of neck pain; relieved with Robaxin   Neurological: He is alert and oriented to person, place, and time.  Skin: Skin is warm and dry.  Psychiatric: His speech is normal and behavior is normal. His mood appears not anxious. Thought content is not paranoid and not delusional. Cognition and memory are normal. He does not exhibit a depressed mood. He expresses no homicidal and no suicidal ideation.  Judgement intact    Review of Systems  Cardiovascular: Negative.   Musculoskeletal: Positive for neck pain (Started Robaxin.  Patient states that it is working.  Decreased pain).  Psychiatric/Behavioral: Positive for depression (Improving) and substance abuse. Negative for suicidal ideas and memory loss.  The patient is nervous/anxious (Improving) and has insomnia (Stable).        Patient states that he does have some anxiety related to if he will be able to go to [facility] on Monday   All other systems reviewed and are negative.   Blood pressure 121/73, pulse 79, temperature 98.1 F (36.7 C), temperature source Oral, resp. rate 20, height 5' 3.5" (1.613 m), weight 70.308 kg (155 lb), SpO2 99 %.Body mass index is 27.02 kg/(m^2).  General Appearance: Casual and Fairly Groomed  Eye Contact::  Good  Speech:  Clear and Coherent  Volume:  Normal  Mood:  States that he is feeling much better denies anxiety and depression  Affect:  Congruent  Thought Process:  Circumstantial, Coherent and Goal Directed  Orientation:  Full (Time, Place, and Person)  Thought Content:  Rumination  Suicidal Thoughts:  No  Homicidal Thoughts:  No  Memory:  Immediate;   Good Recent;   Good Remote;   Good  Judgement:  Intact  Insight:  Present  Psychomotor Activity:  Normal  Concentration:  Fair  Recall:  Good  Fund of Knowledge:Fair  Language: Good  Akathisia:  No  Handed:  Right  AIMS (if indicated):     Assets:  Communication Skills Desire for Improvement  Sleep:  Number of Hours: 6.5   Musculoskeletal: Strength & Muscle Tone: within normal limits Gait & Station: normal Patient leans: N/A  Current Medications: Current Facility-Administered Medications  Medication Dose Route Frequency Provider Last Rate Last Dose  . acamprosate (CAMPRAL) tablet 666 mg  666 mg Oral TID WC Nicholaus Bloom, MD   666 mg at 08/08/14 1158  .  acetaminophen (TYLENOL) tablet 650 mg  650 mg Oral Q6H PRN Benjamine Mola, FNP   650 mg at 08/08/14 1200  . alum & mag hydroxide-simeth (MAALOX/MYLANTA) 200-200-20 MG/5ML suspension 30 mL  30 mL Oral Q4H PRN Benjamine Mola, FNP      . ARIPiprazole (ABILIFY) tablet 5 mg  5 mg Oral Daily Benjamine Mola, FNP   5 mg at 08/08/14 0817  . famotidine (PEPCID) tablet 20 mg  20 mg Oral BID Benjamine Mola, FNP   20 mg at 08/08/14 0817  . FLUoxetine (PROZAC) capsule 80 mg  80 mg Oral Daily Benjamine Mola, FNP   80 mg at 08/08/14 0817  . gabapentin (NEURONTIN) capsule 300 mg  300 mg Oral BID Benjamine Mola, FNP   300 mg at 08/08/14 0817  . Ipratropium-Albuterol (COMBIVENT) respimat 2 puff  2 puff Inhalation BID Nicholaus Bloom, MD   2 puff at 08/08/14 0818  . magnesium hydroxide (MILK OF MAGNESIA) suspension 30 mL  30 mL Oral Daily PRN Benjamine Mola, FNP      . methocarbamol (ROBAXIN) tablet 500 mg  500 mg Oral TID PRN Arnelle Nale, NP   500 mg at 08/08/14 1305  . metoprolol tartrate (LOPRESSOR) tablet 25 mg  25 mg Oral BID Benjamine Mola, FNP   25 mg at 08/08/14 1478  . multivitamin with minerals tablet 1 tablet  1 tablet Oral Daily Benjamine Mola, FNP   1 tablet at 08/08/14 2956  . naproxen (NAPROSYN) tablet 500 mg  500 mg Oral BID WC Benjamine Mola, FNP   500 mg at 08/08/14 0817  . nicotine (NICODERM CQ - dosed in mg/24 hours) patch 21 mg  21 mg Transdermal Daily Benjamine Mola, FNP   21 mg at 08/08/14 0818  . thiamine (B-1) injection 100 mg  100 mg Intramuscular Once Benjamine Mola, FNP   100 mg at 08/01/14 1513  . thiamine (VITAMIN B-1) tablet 100 mg  100 mg Oral Daily Benjamine Mola, FNP   100 mg at 08/08/14 0817  . traZODone (DESYREL) tablet 300 mg  300 mg Oral QHS Nicholaus Bloom, MD   300 mg at 08/07/14 2126    Lab Results: No results found for this or any previous visit (from the past 48 hour(s)).  Physical Findings: AIMS: Facial and Oral Movements Muscles of Facial Expression: None, normal Lips and Perioral Area: None, normal Jaw: None, normal Tongue: None, normal,Extremity Movements Upper (arms, wrists, hands, fingers): None, normal Lower (legs, knees, ankles, toes): None, normal, Trunk Movements Neck, shoulders, hips: None, normal, Overall Severity Severity of abnormal movements (highest score from questions above): None, normal Incapacitation due to abnormal movements:  None, normal Patient's awareness of abnormal movements (rate only patient's report): No Awareness, Dental Status Current problems with teeth and/or dentures?: No Does patient usually wear dentures?: No  CIWA:  CIWA-Ar Total: 3 COWS:  COWS Total Score: 2  Treatment Plan Summary: Daily contact with patient to assess and evaluate symptoms and progress in treatment Medication management  Plan: Supportive approach/coping skills/relapse prevention           Depression: his mood is still depressed anxious worried about a possible relapse if he was to be D/C before he has the rehab bed (08/09/2013) Will continue the medications at the same dosages and help deal with the symptoms using CBT/mindfulness. Continue the grief work            Anxiety:  as above   Alcohol Dependence: given that he is chronic relapse without any significant period of sobriety, will go ahead and continue to work with him during the weekend and have him ready to be admitted to [facility] Monday AM ( door to door) to improve the chances of success   Will discharge tomorrow (Monday morning).  Patient will follow up with [facility] for rehab.   Medical Decision Making Problem Points:  Established problem, stable/improving (1), Review of last therapy session (1) and Review of psycho-social stressors (1) Data Points:  Review or order clinical lab tests (1) Review of medication regiment & side effects (2)  I certify that inpatient services furnished can reasonably be expected to improve the patient's condition.   Earleen Newport, FNP-BC 08/08/2014, 2:34 PM    Patient ID: Belford Pascucci, male   DOB: 1956-04-23, 59 y.o.   MRN: [id number]

## 2014-08-08 NOTE — Progress Notes (Signed)
D. Pt had been up and active in milieu this evening, did attend and participate in evening group activity. Pt spoke about continued pain in his neck and head and reports little relief. Pt also spoke about discharging early Monday morning to be able to go to [facility] and spoke of some anxiety associated with it but feels ready for discharge. A. Support and encouragement provided. R. Safety maintained, will continue to monitor.

## 2014-08-08 NOTE — Progress Notes (Signed)
Patient ID: Samuel Reyes, male   DOB: [birth year], 59 y.o.   MRN: [id number]   D: Pt has been appropriate today on the unit. Pt attended all groups and engaged in treatment. Pt reported that he was ready for discharge and was looking forward to going to [facility] in the morning. Pt did report severe pain to neck due to three neck surgeries. Shavon NP was made aware, new orders noted. Pt reported being negative SI/HI, no AH/VH noted. A: 15 min checks continued for patient safety. R: Pt safety maintained.

## 2014-08-08 NOTE — [facility] Group Notes (Signed)
[facility] Group Notes:  (Nursing/MHT/Case Management/Adjunct)  Date:  08/08/2014  Time:  2:35 PM  Type of Therapy:  Psychoeducational Skills  Participation Level:  Active  Participation Quality:  Appropriate  Affect:  Appropriate  Cognitive:  Appropriate  Insight:  Appropriate  Engagement in Group:  Engaged  Modes of Intervention:  Discussion  Summary of Progress/Problems: Pt did attend self inventory group, pt reported that he was negative SI/HI, no AH/VH noted. Pt rated his depression as a 7, and his helplessness/hopelessness as a 2.     Pt reported concerns about severe pain to neck with no relief from Tylenol, pt advised that the doctor will be made aware.   Benancio Deeds Shanta 08/08/2014, 2:35 PM

## 2014-08-08 NOTE — [facility] Group Notes (Addendum)
[place] LCSW Group Therapy  08/08/2014   1: 15 PM   Type of Therapy:  Group Therapy  Participation Level:  Active  Participation Quality:  Appropriate and Attentive  Affect:  Appropriate, Calm  Cognitive:  Alert and Appropriate  Insight:  Developing/Improving and Engaged  Engagement in Therapy:  Developing/Improving and Engaged  Modes of Intervention:  Clarification, Confrontation, Discussion, Education, Exploration, Limit-setting, Orientation, Problem-solving, Rapport Building, Art therapist, Socialization and Support  Summary of Progress/Problems: The main focus of today's process group was to identify the patient's current support system and decide on other supports that can be put in place.  An emphasis was placed on using counselor, doctor, therapy groups, 12-step groups, and problem-specific support groups to expand supports, as well as doing something different than has been done before.  Pt discussed being anxious about transferring to [place], but with the support of CSW and peer, pt felt confident on the directions on how to get there.  Pt discussed feeling like he has no support, from being from [place] and burning all bridges with his alcohol use.  Pt also states lack of transportation is a barrier to getting to meetings or support groups.  Pt is hopeful going to [facility] will help build him back up and get him on the right path.  Pt actively participated and was engaged in group discussion.     Regan Lemming, LCSW 08/08/2014 2:52 PM

## 2014-08-08 NOTE — [facility] Suicide Risk Assessment (Signed)
Demographic Factors:  Male, Divorced or widowed, Caucasian, Living alone and Unemployed  Total Time spent with patient: 30 minutes  Psychiatric Specialty Exam: Physical Exam  ROS  Blood pressure 121/73, pulse 79, temperature 98.1 F (36.7 C), temperature source Oral, resp. rate 20, height 5' 3.5" (1.613 m), weight 70.308 kg (155 lb), SpO2 99 %.Body mass index is 27.02 kg/(m^2).  General Appearance: Casual and Fairly Groomed  Engineer, water::  Fair  Speech:  Normal Rate  Volume:  Normal  Mood:  Euthymic  Affect:  Appropriate, Congruent and Full Range  Thought Process:  Goal Directed  Orientation:  Full (Time, Place, and Person)  Thought Content:  Negative  Suicidal Thoughts:  No  Homicidal Thoughts:  No  Memory:  Negative  Judgement:  Good  Insight:  Good  Psychomotor Activity:  Normal  Concentration:  Fair  Recall:  [place] of Knowledge:Fair  Language: Fair  Akathisia:  Negative  Handed:  Right  AIMS (if indicated):     Assets:  Communication Skills Desire for Improvement Financial Resources/Insurance Housing Resilience Social Support  Sleep:  Number of Hours: 6.5    Musculoskeletal: Strength & Muscle Tone: within normal limits Gait & Station: normal Patient leans: N/A   Mental Status Per Nursing Assessment::   On Admission:     Current Mental Status by Physician: Pt reports feeling better, mood is better. Sleep poor last night, due to neck pain. appetite good. Tolerating meds well. Pt denies SI/HI/AVH. Pt denies withdrawal symptoms. Pt looking forward to going to [facility] tomorrow AM.Pt is interactive with staff/peers.  Loss Factors: NA  Historical Factors: Prior suicide attempts, Anniversary of important loss and Impulsivity  Risk Reduction Factors:   Sense of responsibility to family, Positive social support, Positive therapeutic relationship and Positive coping skills or problem solving skills  Continued Clinical Symptoms:  Depression:   Comorbid  alcohol abuse/dependence Alcohol/Substance Abuse/Dependencies  Cognitive Features That Contribute To Risk:  Thought constriction (tunnel vision)    Suicide Risk:  Mild:  Suicidal ideation of limited frequency, intensity, duration, and specificity.  There are no identifiable plans, no associated intent, mild dysphoria and related symptoms, good self-control (both objective and subjective assessment), few other risk factors, and identifiable protective factors, including available and accessible social support.  Discharge Diagnoses:   AXIS I:  Alcohol Abuse and Major Depression, Recurrent severe AXIS II:  Deferred AXIS III:   Past Medical History  Diagnosis Date  . Cirrhosis   . Degenerative joint disease   . Hypertension   . Gastric ulcer   . Depression   . Asthma   . Mitral valve prolapse 2002  . H/O hiatal hernia   . COPD (chronic obstructive pulmonary disease)   . TIA (transient ischemic attack)     2010  . Stroke     TIA - 2010 - no deficits   . Shortness of breath     with exertion   . GERD (gastroesophageal reflux disease)   . Headache(784.0)   . Hiatal hernia 1982  . Degenerative joint disease   . Anxiety    AXIS IV:  other psychosocial or environmental problems AXIS V:  51-60 moderate symptoms  Plan Of Care/Follow-up recommendations:  Activity:  as tolerated Diet:  low salt Tests:  per PCP Other:  Will be going to [facility] tomorrow morning  Is patient on multiple antipsychotic therapies at discharge:  No   Has Patient had three or more failed trials of antipsychotic monotherapy by history:  No  Recommended Plan for Multiple Antipsychotic Therapies: NA    Samuel Reyes 08/08/2014, 4:40 PM

## 2014-08-09 NOTE — Clinical Social Work Note (Signed)
CSW left message for [facility] Admissions coordinator Merry Proud regarding if patient was accepted into program due to issues with medicaid registration. Awaiting return call.  Tilden Fossa, MSW, [place] Worker [facility] [phone]

## 2014-08-09 NOTE — Progress Notes (Signed)
Pt discharged at this time. Pt denies SI/HI. Pt provided with discharge instructions and pt verbalized understanding. Pt provided with supply of medications as well as prescriptions. All belongings were returned.

## 2014-08-11 NOTE — Progress Notes (Signed)
Patient Discharge Instructions:  After Visit Summary (AVS):   Faxed to:  08/11/14 Discharge Summary Note:   Faxed to:  08/11/14 Psychiatric Admission Assessment Note:   Faxed to:  08/11/14 Suicide Risk Assessment - Discharge Assessment:   Faxed to:  08/11/14 Faxed/Sent to the Next Level Care provider:  08/11/14 Faxed to [facility] @ [phone] No documentation was sent to [facility] for HBIPS.  Per the SW the information was already sent.  [name], 08/11/2014, 3:34 PM

## 2014-08-31 ENCOUNTER — Emergency Department (HOSPITAL_COMMUNITY): Payer: Medicaid Other

## 2014-08-31 ENCOUNTER — Inpatient Hospital Stay (HOSPITAL_COMMUNITY)
Admission: EM | Admit: 2014-08-31 | Discharge: 2014-09-05 | DRG: 194 | Disposition: A | Discharge status: Home / Self Care | Payer: Medicaid Other | Attending: Internal Medicine | Admitting: Internal Medicine

## 2014-08-31 ENCOUNTER — Encounter (HOSPITAL_COMMUNITY): Payer: Self-pay | Admitting: Emergency Medicine

## 2014-08-31 DIAGNOSIS — Z66 Do not resuscitate: Secondary | ICD-10-CM | POA: Diagnosis present

## 2014-08-31 DIAGNOSIS — I10 Essential (primary) hypertension: Secondary | ICD-10-CM | POA: Diagnosis present

## 2014-08-31 DIAGNOSIS — F17210 Nicotine dependence, cigarettes, uncomplicated: Secondary | ICD-10-CM | POA: Diagnosis present

## 2014-08-31 DIAGNOSIS — E871 Hypo-osmolality and hyponatremia: Secondary | ICD-10-CM | POA: Diagnosis present

## 2014-08-31 DIAGNOSIS — F339 Major depressive disorder, recurrent, unspecified: Secondary | ICD-10-CM | POA: Diagnosis present

## 2014-08-31 DIAGNOSIS — Z880 Allergy status to penicillin: Secondary | ICD-10-CM

## 2014-08-31 DIAGNOSIS — Y95 Nosocomial condition: Secondary | ICD-10-CM | POA: Diagnosis present

## 2014-08-31 DIAGNOSIS — E876 Hypokalemia: Secondary | ICD-10-CM | POA: Diagnosis present

## 2014-08-31 DIAGNOSIS — Z79899 Other long term (current) drug therapy: Secondary | ICD-10-CM

## 2014-08-31 DIAGNOSIS — F10239 Alcohol dependence with withdrawal, unspecified: Secondary | ICD-10-CM | POA: Diagnosis present

## 2014-08-31 DIAGNOSIS — R1110 Vomiting, unspecified: Secondary | ICD-10-CM

## 2014-08-31 DIAGNOSIS — R52 Pain, unspecified: Secondary | ICD-10-CM

## 2014-08-31 DIAGNOSIS — Z91030 Bee allergy status: Secondary | ICD-10-CM

## 2014-08-31 DIAGNOSIS — Z791 Long term (current) use of non-steroidal anti-inflammatories (NSAID): Secondary | ICD-10-CM | POA: Diagnosis not present

## 2014-08-31 DIAGNOSIS — J189 Pneumonia, unspecified organism: Principal | ICD-10-CM | POA: Diagnosis present

## 2014-08-31 DIAGNOSIS — Z8673 Personal history of transient ischemic attack (TIA), and cerebral infarction without residual deficits: Secondary | ICD-10-CM | POA: Diagnosis not present

## 2014-08-31 DIAGNOSIS — R112 Nausea with vomiting, unspecified: Secondary | ICD-10-CM | POA: Diagnosis present

## 2014-08-31 DIAGNOSIS — J45909 Unspecified asthma, uncomplicated: Secondary | ICD-10-CM | POA: Diagnosis present

## 2014-08-31 DIAGNOSIS — R1115 Cyclical vomiting syndrome unrelated to migraine: Secondary | ICD-10-CM

## 2014-08-31 DIAGNOSIS — K219 Gastro-esophageal reflux disease without esophagitis: Secondary | ICD-10-CM | POA: Diagnosis present

## 2014-08-31 LAB — CBC WITH DIFFERENTIAL/PLATELET
BASOS PCT: 0 % (ref 0–1)
Basophils Absolute: 0 10*3/uL (ref 0.0–0.1)
Eosinophils Absolute: 0 10*3/uL (ref 0.0–0.7)
Eosinophils Relative: 0 % (ref 0–5)
HCT: 36 % — ABNORMAL LOW (ref 39.0–52.0)
HEMOGLOBIN: 12.5 g/dL — AB (ref 13.0–17.0)
LYMPHS ABS: 1.1 10*3/uL (ref 0.7–4.0)
LYMPHS PCT: 9 % — AB (ref 12–46)
MCH: 28.9 pg (ref 26.0–34.0)
MCHC: 34.7 g/dL (ref 30.0–36.0)
MCV: 83.1 fL (ref 78.0–100.0)
MONO ABS: 1.4 10*3/uL — AB (ref 0.1–1.0)
Monocytes Relative: 11 % (ref 3–12)
NEUTROS ABS: 10.3 10*3/uL — AB (ref 1.7–7.7)
Neutrophils Relative %: 80 % — ABNORMAL HIGH (ref 43–77)
PLATELETS: 336 10*3/uL (ref 150–400)
RBC: 4.33 MIL/uL (ref 4.22–5.81)
RDW: 13.9 % (ref 11.5–15.5)
WBC: 12.8 10*3/uL — AB (ref 4.0–10.5)

## 2014-08-31 LAB — COMPREHENSIVE METABOLIC PANEL
ALK PHOS: 71 U/L (ref 39–117)
ALT: 11 U/L (ref 0–53)
AST: 16 U/L (ref 0–37)
Albumin: 3 g/dL — ABNORMAL LOW (ref 3.5–5.2)
Anion gap: 9 (ref 5–15)
BUN: 6 mg/dL (ref 6–23)
CALCIUM: 7.3 mg/dL — AB (ref 8.4–10.5)
CHLORIDE: 86 mmol/L — AB (ref 96–112)
CO2: 30 mmol/L (ref 19–32)
Creatinine, Ser: 0.67 mg/dL (ref 0.50–1.35)
GFR calc Af Amer: >90 mL/min (ref >90)
GFR calc non Af Amer: >90 mL/min (ref >90)
GLUCOSE: 98 mg/dL (ref 70–99)
POTASSIUM: 2.5 mmol/L — AB (ref 3.5–5.1)
SODIUM: 125 mmol/L — AB (ref 135–145)
TOTAL PROTEIN: 5.6 g/dL — AB (ref 6.0–8.3)
Total Bilirubin: 0.8 mg/dL (ref 0.3–1.2)

## 2014-08-31 LAB — BASIC METABOLIC PANEL
ANION GAP: 4 — AB (ref 5–15)
BUN: 5 mg/dL — ABNORMAL LOW (ref 6–23)
CALCIUM: 7.2 mg/dL — AB (ref 8.4–10.5)
CO2: 29 mmol/L (ref 19–32)
CREATININE: 0.74 mg/dL (ref 0.50–1.35)
Chloride: 100 mmol/L (ref 96–112)
Glucose, Bld: 119 mg/dL — ABNORMAL HIGH (ref 70–99)
POTASSIUM: 3.3 mmol/L — AB (ref 3.5–5.1)
SODIUM: 133 mmol/L — AB (ref 135–145)

## 2014-08-31 LAB — TROPONIN I: Troponin I: 0.03 ng/mL (ref <0.031)

## 2014-08-31 LAB — MAGNESIUM: MAGNESIUM: 1.8 mg/dL (ref 1.5–2.5)

## 2014-08-31 LAB — LIPASE, BLOOD: LIPASE: 22 U/L (ref 11–59)

## 2014-08-31 LAB — STREP PNEUMONIAE URINARY ANTIGEN: Strep Pneumo Urinary Antigen: NEGATIVE

## 2014-08-31 MED ORDER — ONDANSETRON HCL 4 MG PO TABS
4.0000 mg | ORAL_TABLET | Freq: Four times a day (QID) | ORAL | Status: DC | PRN
  Administered 2014-09-05: 4 mg via ORAL
  Filled 2014-08-31 (×2): qty 1

## 2014-08-31 MED ORDER — FOLIC ACID 1 MG PO TABS
1.0000 mg | ORAL_TABLET | Freq: Every day | ORAL | Status: DC
Start: 2014-08-31 — End: 2014-09-05
  Administered 2014-08-31 – 2014-09-05 (×6): 1 mg via ORAL
  Filled 2014-08-31 (×6): qty 1

## 2014-08-31 MED ORDER — VITAMIN B-1 100 MG PO TABS
100.0000 mg | ORAL_TABLET | Freq: Every day | ORAL | Status: DC
  Filled 2014-08-31: qty 1

## 2014-08-31 MED ORDER — GABAPENTIN 300 MG PO CAPS
300.0000 mg | ORAL_CAPSULE | Freq: Two times a day (BID) | ORAL | Status: DC
  Administered 2014-08-31 – 2014-09-05 (×11): 300 mg via ORAL
  Filled 2014-08-31 (×12): qty 1

## 2014-08-31 MED ORDER — ALUM & MAG HYDROXIDE-SIMETH 200-200-20 MG/5ML PO SUSP: 30.0000 mL | Freq: Four times a day (QID) | ORAL | Status: DC | PRN

## 2014-08-31 MED ORDER — POTASSIUM CHLORIDE IN NACL 40-0.9 MEQ/L-% IV SOLN
INTRAVENOUS | Status: DC
  Administered 2014-08-31 – 2014-09-04 (×12): 125 mL/h via INTRAVENOUS
  Filled 2014-08-31 (×18): qty 1000

## 2014-08-31 MED ORDER — ASPIRIN 81 MG PO CHEW
81.0000 mg | CHEWABLE_TABLET | Freq: Every day | ORAL | Status: DC
  Administered 2014-08-31 – 2014-09-05 (×6): 81 mg via ORAL
  Filled 2014-08-31 (×7): qty 1

## 2014-08-31 MED ORDER — VITAMIN B-1 100 MG PO TABS
100.0000 mg | ORAL_TABLET | Freq: Every day | ORAL | Status: DC
Start: 2014-08-31 — End: 2014-09-05
  Administered 2014-08-31 – 2014-09-05 (×6): 100 mg via ORAL
  Filled 2014-08-31 (×6): qty 1

## 2014-08-31 MED ORDER — LORAZEPAM 2 MG/ML IJ SOLN
1.0000 mg | Freq: Four times a day (QID) | INTRAMUSCULAR | Status: AC | PRN
  Administered 2014-08-31: 1 mg via INTRAVENOUS
  Filled 2014-08-31: qty 1

## 2014-08-31 MED ORDER — LORAZEPAM 2 MG/ML IJ SOLN: 0.0000 mg | Freq: Two times a day (BID) | INTRAMUSCULAR | Status: DC

## 2014-08-31 MED ORDER — LORAZEPAM 1 MG PO TABS
0.0000 mg | ORAL_TABLET | Freq: Two times a day (BID) | ORAL | Status: DC
  Administered 2014-08-31: 1 mg via ORAL
  Administered 2014-09-03: 2 mg via ORAL
  Administered 2014-09-03: 1 mg via ORAL
  Administered 2014-09-04: 2 mg via ORAL
  Filled 2014-08-31: qty 2
  Filled 2014-08-31: qty 1
  Filled 2014-08-31: qty 2
  Filled 2014-08-31: qty 1

## 2014-08-31 MED ORDER — LORAZEPAM 1 MG PO TABS
0.0000 mg | ORAL_TABLET | Freq: Four times a day (QID) | ORAL | Status: AC
  Administered 2014-08-31 – 2014-09-01 (×3): 1 mg via ORAL
  Filled 2014-08-31 (×3): qty 1

## 2014-08-31 MED ORDER — METOPROLOL TARTRATE 25 MG PO TABS
25.0000 mg | ORAL_TABLET | Freq: Two times a day (BID) | ORAL | Status: DC
  Administered 2014-08-31 – 2014-09-05 (×11): 25 mg via ORAL
  Filled 2014-08-31 (×12): qty 1

## 2014-08-31 MED ORDER — ARIPIPRAZOLE 5 MG PO TABS
5.0000 mg | ORAL_TABLET | Freq: Every day | ORAL | Status: DC
  Administered 2014-08-31 – 2014-09-05 (×6): 5 mg via ORAL
  Filled 2014-08-31 (×6): qty 1

## 2014-08-31 MED ORDER — GI COCKTAIL ~~LOC~~
30.0000 mL | Freq: Once | ORAL | Status: AC
  Administered 2014-08-31: 30 mL via ORAL
  Filled 2014-08-31: qty 30

## 2014-08-31 MED ORDER — ENOXAPARIN SODIUM 40 MG/0.4ML ~~LOC~~ SOLN
40.0000 mg | SUBCUTANEOUS | Status: DC
  Administered 2014-08-31 – 2014-09-04 (×5): 40 mg via SUBCUTANEOUS
  Filled 2014-08-31 (×6): qty 0.4

## 2014-08-31 MED ORDER — ONDANSETRON HCL 4 MG/2ML IJ SOLN
4.0000 mg | Freq: Four times a day (QID) | INTRAMUSCULAR | Status: DC | PRN
  Administered 2014-08-31: 4 mg via INTRAVENOUS
  Filled 2014-08-31: qty 2

## 2014-08-31 MED ORDER — DEXTROSE 5 % IV SOLN
2.0000 g | Freq: Once | INTRAVENOUS | Status: AC
  Administered 2014-08-31: 2 g via INTRAVENOUS
  Filled 2014-08-31: qty 2

## 2014-08-31 MED ORDER — LORAZEPAM 2 MG/ML IJ SOLN: 0.0000 mg | Freq: Four times a day (QID) | INTRAMUSCULAR | Status: DC

## 2014-08-31 MED ORDER — VANCOMYCIN HCL IN DEXTROSE 750-5 MG/150ML-% IV SOLN
750.0000 mg | Freq: Three times a day (TID) | INTRAVENOUS | Status: DC
  Administered 2014-08-31 – 2014-09-01 (×4): 750 mg via INTRAVENOUS
  Filled 2014-08-31 (×6): qty 150

## 2014-08-31 MED ORDER — NICOTINE 21 MG/24HR TD PT24
21.0000 mg | MEDICATED_PATCH | Freq: Every day | TRANSDERMAL | Status: DC
  Administered 2014-08-31 – 2014-09-05 (×6): 21 mg via TRANSDERMAL
  Filled 2014-08-31 (×6): qty 1

## 2014-08-31 MED ORDER — THIAMINE HCL 100 MG/ML IJ SOLN
100.0000 mg | Freq: Every day | INTRAMUSCULAR | Status: DC
  Filled 2014-08-31 (×6): qty 1

## 2014-08-31 MED ORDER — PANTOPRAZOLE SODIUM 40 MG IV SOLR
40.0000 mg | INTRAVENOUS | Status: DC
  Administered 2014-08-31 – 2014-09-02 (×3): 40 mg via INTRAVENOUS
  Filled 2014-08-31 (×3): qty 40

## 2014-08-31 MED ORDER — LORAZEPAM 2 MG/ML IJ SOLN
1.0000 mg | Freq: Three times a day (TID) | INTRAMUSCULAR | Status: DC
  Administered 2014-08-31 – 2014-09-05 (×12): 1 mg via INTRAVENOUS
  Filled 2014-08-31 (×10): qty 1

## 2014-08-31 MED ORDER — FLUOXETINE HCL 20 MG PO CAPS
80.0000 mg | ORAL_CAPSULE | Freq: Every day | ORAL | Status: DC
Start: 2014-08-31 — End: 2014-09-05
  Administered 2014-08-31 – 2014-09-05 (×6): 80 mg via ORAL
  Filled 2014-08-31 (×6): qty 4

## 2014-08-31 MED ORDER — POTASSIUM CHLORIDE 10 MEQ/100ML IV SOLN
10.0000 meq | INTRAVENOUS | Status: AC
  Administered 2014-08-31 (×4): 10 meq via INTRAVENOUS
  Filled 2014-08-31 (×4): qty 100

## 2014-08-31 MED ORDER — VANCOMYCIN HCL IN DEXTROSE 1-5 GM/200ML-% IV SOLN: 1000.0000 mg | Freq: Once | INTRAVENOUS | Status: DC

## 2014-08-31 MED ORDER — THIAMINE HCL 100 MG/ML IJ SOLN
100.0000 mg | Freq: Once | INTRAMUSCULAR | Status: AC
  Administered 2014-08-31: 100 mg via INTRAVENOUS
  Filled 2014-08-31: qty 2

## 2014-08-31 MED ORDER — SODIUM CHLORIDE 0.9 % IJ SOLN
3.0000 mL | Freq: Two times a day (BID) | INTRAMUSCULAR | Status: DC
Start: 2014-08-31 — End: 2014-09-05
  Administered 2014-08-31 – 2014-09-05 (×6): 3 mL via INTRAVENOUS

## 2014-08-31 MED ORDER — MAGNESIUM SULFATE 2 GM/50ML IV SOLN
2.0000 g | Freq: Once | INTRAVENOUS | Status: AC
  Administered 2014-08-31: 2 g via INTRAVENOUS
  Filled 2014-08-31: qty 50

## 2014-08-31 MED ORDER — SODIUM CHLORIDE 0.9 % IV SOLN
1500.0000 mg | Freq: Once | INTRAVENOUS | Status: AC
  Administered 2014-08-31: 1500 mg via INTRAVENOUS
  Filled 2014-08-31: qty 1500

## 2014-08-31 MED ORDER — SODIUM CHLORIDE 0.9 % IV SOLN
INTRAVENOUS | Status: AC
  Administered 2014-08-31: 1000 mL via INTRAVENOUS

## 2014-08-31 MED ORDER — OXYCODONE HCL 5 MG PO TABS
5.0000 mg | ORAL_TABLET | ORAL | Status: DC | PRN
  Administered 2014-08-31 – 2014-09-03 (×6): 5 mg via ORAL
  Filled 2014-08-31 (×6): qty 1

## 2014-08-31 MED ORDER — LORAZEPAM 1 MG PO TABS
1.0000 mg | ORAL_TABLET | Freq: Four times a day (QID) | ORAL | Status: AC | PRN
  Administered 2014-09-02: 1 mg via ORAL
  Filled 2014-08-31: qty 1

## 2014-08-31 MED ORDER — ADULT MULTIVITAMIN W/MINERALS CH
1.0000 | ORAL_TABLET | Freq: Every day | ORAL | Status: DC
  Administered 2014-08-31 – 2014-09-05 (×6): 1 via ORAL
  Filled 2014-08-31 (×6): qty 1

## 2014-08-31 MED ORDER — DEXTROSE 5 % IV SOLN
2.0000 g | Freq: Three times a day (TID) | INTRAVENOUS | Status: DC
  Administered 2014-08-31 – 2014-09-04 (×12): 2 g via INTRAVENOUS
  Filled 2014-08-31 (×14): qty 2

## 2014-08-31 MED ORDER — THIAMINE HCL 100 MG/ML IJ SOLN
100.0000 mg | Freq: Every day | INTRAMUSCULAR | Status: DC
  Filled 2014-08-31: qty 1

## 2014-08-31 MED ORDER — POTASSIUM CHLORIDE 10 MEQ/100ML IV SOLN
10.0000 meq | INTRAVENOUS | Status: DC
  Filled 2014-08-31 (×3): qty 100

## 2014-08-31 MED ORDER — IPRATROPIUM-ALBUTEROL 0.5-2.5 (3) MG/3ML IN SOLN
3.0000 mL | Freq: Two times a day (BID) | RESPIRATORY_TRACT | Status: DC
  Administered 2014-08-31 – 2014-09-04 (×10): 3 mL via RESPIRATORY_TRACT
  Filled 2014-08-31 (×9): qty 3

## 2014-08-31 MED ORDER — ACETAMINOPHEN 325 MG PO TABS
650.0000 mg | ORAL_TABLET | Freq: Four times a day (QID) | ORAL | Status: DC | PRN
  Administered 2014-09-02 – 2014-09-05 (×4): 650 mg via ORAL
  Filled 2014-08-31 (×4): qty 2

## 2014-08-31 MED ORDER — TRAZODONE HCL 150 MG PO TABS
300.0000 mg | ORAL_TABLET | Freq: Every day | ORAL | Status: DC
  Administered 2014-08-31 – 2014-09-04 (×5): 300 mg via ORAL
  Filled 2014-08-31 (×6): qty 2

## 2014-08-31 MED ORDER — SODIUM CHLORIDE 0.9 % IV BOLUS (SEPSIS)
2000.0000 mL | Freq: Once | INTRAVENOUS | Status: AC
Start: 2014-08-31 — End: 2014-08-31
  Administered 2014-08-31: 2000 mL via INTRAVENOUS

## 2014-08-31 MED ORDER — POTASSIUM CHLORIDE 10 MEQ/100ML IV SOLN
10.0000 meq | Freq: Once | INTRAVENOUS | Status: AC
  Administered 2014-08-31: 10 meq via INTRAVENOUS
  Filled 2014-08-31: qty 100

## 2014-08-31 MED ORDER — ACETAMINOPHEN 650 MG RE SUPP: 650.0000 mg | Freq: Four times a day (QID) | RECTAL | Status: DC | PRN

## 2014-08-31 NOTE — ED Notes (Signed)
Pt. arrived with EMS from home reports persistent emesis for several days , poor appetite , denies diarrhea or fever . Pt. received Zofran 4 mg IV by EMS with no relief. CBG= 102.

## 2014-08-31 NOTE — Progress Notes (Signed)
ANTIBIOTIC CONSULT NOTE - INITIAL  Pharmacy Consult for vancomycin, renal adjustment of antibiotics Indication: pneumonia  Allergies  Allergen Reactions  . Bee Venom Anaphylaxis  . Penicillins Rash    Patient Measurements: Height: 5\' 4"  (162.6 cm) Weight: 160 lb (72.576 kg) IBW/kg (Calculated) : 59.2   Vital Signs: Temp: 98.6 F (37 C) (02/02 0219) Temp Source: Oral (02/02 0219) BP: 159/75 mmHg (02/02 0700) Pulse Rate: 80 (02/02 0700) Intake/Output from previous day: 02/01 0701 - 02/02 0700 In: 100 [IV Piggyback:100] Out: 900 [Urine:900] Intake/Output from this shift: Total I/O In: 1000 [I.V.:1000] Out: -   Labs:  Recent Labs  08/31/14 0430  WBC 12.8*  HGB 12.5*  PLT 336  CREATININE 0.67   Estimated Creatinine Clearance: 90.8 mL/min (by C-G formula based on Cr of 0.67). No results for input(s): VANCOTROUGH, VANCOPEAK, VANCORANDOM, GENTTROUGH, GENTPEAK, GENTRANDOM, TOBRATROUGH, TOBRAPEAK, TOBRARND, AMIKACINPEAK, AMIKACINTROU, AMIKACIN in the last 72 hours.   Microbiology: No results found for this or any previous visit (from the past 720 hour(s)).  Medical History: Past Medical History  Diagnosis Date  . Cirrhosis   . Degenerative joint disease   . Hypertension   . Gastric ulcer   . Depression   . Asthma   . Mitral valve prolapse 2002  . H/O hiatal hernia   . COPD (chronic obstructive pulmonary disease)   . TIA (transient ischemic attack)     2010  . Stroke     TIA - 2010 - no deficits   . Shortness of breath     with exertion   . GERD (gastroesophageal reflux disease)   . Headache(784.0)   . Hiatal hernia 1982  . Degenerative joint disease   . Anxiety   . Alcohol abuse     Medications:  Prescriptions prior to admission  Medication Sig Dispense Refill Last Dose  . acamprosate (CAMPRAL) 333 MG tablet Take 2 tablets (666 mg total) by mouth 3 (three) times daily with meals. For alcoholism 60 tablet 0 08/30/2014 at Unknown time  . albuterol  (PROVENTIL HFA;VENTOLIN HFA) 108 (90 BASE) MCG/ACT inhaler Inhale 2 puffs into the lungs every 6 (six) hours as needed for wheezing or shortness of breath. 1 Inhaler 5 08/30/2014 at Unknown time  . albuterol-ipratropium (COMBIVENT) 18-103 MCG/ACT inhaler Inhale 2 puffs into the lungs 2 (two) times daily. For shortness of breath 1 Inhaler 5 08/30/2014 at Unknown time  . ARIPiprazole (ABILIFY) 5 MG tablet Take 5 mg by mouth daily.   08/30/2014 at Unknown time  . famotidine (PEPCID) 20 MG tablet Take 1 tablet (20 mg total) by mouth 2 (two) times daily. 60 tablet 0 08/30/2014 at Unknown time  . FLUoxetine (PROZAC) 40 MG capsule Take 2 capsules (80 mg total) by mouth daily. For depression 60 capsule 0 08/30/2014 at Unknown time  . gabapentin (NEURONTIN) 300 MG capsule Take 1 capsule (300 mg total) by mouth 2 (two) times daily. For substance withdrawal syndrome 60 capsule 0 08/30/2014 at Unknown time  . metoprolol tartrate (LOPRESSOR) 25 MG tablet Take 1 tablet (25 mg total) by mouth 2 (two) times daily. For high blood pressure 60 tablet 3 08/30/2014 at 0800  . naproxen (NAPROSYN) 500 MG tablet Take 500 mg by mouth 2 (two) times daily with a meal.   Past Month at Unknown time  . nicotine (NICODERM CQ - DOSED IN MG/24 HOURS) 21 mg/24hr patch Place 21 mg onto the skin daily.   Past Month at Unknown time  . trazodone (DESYREL) 150 MG  tablet Take 2 tablets (300 mg total) by mouth at bedtime. For sleep 60 tablet 0 08/29/2014 at Unknown time   Assessment: 59 yo man with recent admission to [facility] who is starting broad spectrum antibiotics for PNA.  He has been vomiting last 6 days and has patchy infiltrate on CXR. He may have ?ileus on KUB.  His SrCr is 0.67 and CrCl ~90 ml/min.  Goal of Therapy:  Vancomycin trough level 15-20 mcg/ml  Plan:  Vancomycin 1500 mg IV X 1 then 750 mg IV q8 hours Cont aztreonam 2 gm IV q8 hours F/u renal function, cultures and clinical course Check vanc trough when appropriate  Thanks for  allowing pharmacy to be a part of this patient's care.  Excell Seltzer, PharmD Clinical Pharmacist, [phone] 08/31/2014,7:47 AM

## 2014-08-31 NOTE — Evaluation (Signed)
Clinical/Bedside Swallow Evaluation Patient Details  Name: Samuel Reyes MRN: [id number] Date of Birth: 05/25/1956  Today's Date: 08/31/2014 Time: SLP Start Time (ACUTE ONLY): 1152 SLP Stop Time (ACUTE ONLY): 1205 SLP Time Calculation (min) (ACUTE ONLY): 13 min  Past Medical History:  Past Medical History  Diagnosis Date  . Cirrhosis   . Degenerative joint disease   . Hypertension   . Gastric ulcer   . Depression   . Asthma   . Mitral valve prolapse 2002  . H/O hiatal hernia   . COPD (chronic obstructive pulmonary disease)   . TIA (transient ischemic attack)     2010  . Stroke     TIA - 2010 - no deficits   . Shortness of breath     with exertion   . GERD (gastroesophageal reflux disease)   . Headache(784.0)   . Hiatal hernia 1982  . Degenerative joint disease   . Anxiety   . Alcohol abuse    Past Surgical History:  Past Surgical History  Procedure Laterality Date  . Gastrectomy    . Shoulder surgery Bilateral     3 surgeries on on left, 2 surgeries on right   . Rt knee arthroscopic surgery    . Back surgery      3 cervical spine surgeries C4-C5 fused  . Hernia repair    . Finger surgery Left     2nd, 3rd, & 4th fingers were cut off by table saw and reattached  . Colonoscopy N/A 01/04/2014    Procedure: COLONOSCOPY;  Surgeon: Danie Binder, MD;  Location: [facility];  Service: Endoscopy;  Laterality: N/A;  1:45  . Esophagogastroduodenoscopy N/A 01/04/2014    Procedure: ESOPHAGOGASTRODUODENOSCOPY (EGD);  Surgeon: Danie Binder, MD;  Location: [facility];  Service: Endoscopy;  Laterality: N/A;  . Incisional hernia repair N/A 01/20/2014    Procedure: LAPAROSCOPIC RECURRENT  INCISIONAL HERNIA with mesh;  Surgeon: Edward Jolly, MD;  Location: [facility];  Service: General;  Laterality: N/A;   HPI:  59 y.o. male, with a past medical history of alcoholism, suicide attempt, gastrectomy for peptic ulcer disease, TIA, COPD, GERD, hiatal hernia. EGD 01/04/14 PROBABLE  BARRETT'S ESOPHAGUS, small hiatal hernia,  MODERATE Non-erosive gastritis. He presents to the ER with 5-6 days of vomiting and epigastric pain.He drinks 24 - 40 beers daily but at this time denies hard alcohol and recreational drugs. CXR minimal patchy airspace opacity right lower lobe concerning for pneumonia.   Assessment / Plan / Recommendation Clinical Impression  Pt reported history of cervical fusion "60 years" ago with mild difficulty swallowing as well a frequent globus sensation (taking meds per pt report). No indications of pharyngeal dysphagia; he declined solid due to endentulous. Episode of emesis prior to po's. SLP suspects aspiration during frequent emesis or decreased esophageal function at baseline (GERD, hiatal hernia, probable Barrett's esophagus). SLP educated pt re: reflux precautions; voiced understanding. Pt prefers to start with clears; recommend clear liquids with upgrade to Dys 3 2/3 (no dentures and does not eat hard textures). No f/u ST needed.      Aspiration Risk   (mild-mod)    Diet Recommendation Thin liquid (clears due to nausea)   Liquid Administration via: Cup;Straw Medication Administration: Whole meds with liquid Supervision: Patient able to self feed Compensations: Slow rate;Small sips/bites Postural Changes and/or Swallow Maneuvers: Seated upright 90 degrees;Upright 30-60 min after meal    Other  Recommendations Oral Care Recommendations: Oral care BID   Follow Up  Recommendations  None    Frequency and Duration        Pertinent Vitals/Pain none         Swallow Study          Oral/Motor/Sensory Function Overall Oral Motor/Sensory Function: Appears within functional limits for tasks assessed   Ice Chips Ice chips: Not tested   Thin Liquid Thin Liquid: Within functional limits Presentation: Cup    Nectar Thick Nectar Thick Liquid: Not tested   Honey Thick Honey Thick Liquid: Not tested   Puree Puree: Within functional limits    Solid   GO    Solid:  (pt refused due to no endentulous)       Samuel Reyes, Samuel Reyes 08/31/2014,12:16 PM  Samuel Reyes Samuel Reyes.Ed [employer] [phone]

## 2014-08-31 NOTE — Progress Notes (Signed)
Patient trasfered from Ed to [phone] via wheelchair; alert and oriented x 4;  complaints of headache, nausea/vomiting; increase anxiety; IV in RH running NS@125cc /hr; skin intact. Orient patient to room and unit; watch safety video; gave patient care guide; instructed how to use the call bell and  fall risk precautions. Will continue to monitor the patient.

## 2014-08-31 NOTE — H&P (Signed)
[facility] History and Physical                                                                                    Samuel Reyes, is a 59 y.o. male  MRN: [id number]   DOB - [birth year]  Admit Date - 08/31/2014  Outpatient Primary MD for the patient is Lorayne Marek, MD  [facility].  With History of -  Past Medical History  Diagnosis Date  . Cirrhosis   . Degenerative joint disease   . Hypertension   . Gastric ulcer   . Depression   . Asthma   . Mitral valve prolapse 2002  . H/O hiatal hernia   . COPD (chronic obstructive pulmonary disease)   . TIA (transient ischemic attack)     2010  . Stroke     TIA - 2010 - no deficits   . Shortness of breath     with exertion   . GERD (gastroesophageal reflux disease)   . Headache(784.0)   . Hiatal hernia 1982  . Degenerative joint disease   . Anxiety   . Alcohol abuse       Past Surgical History  Procedure Laterality Date  . Gastrectomy    . Shoulder surgery Bilateral     3 surgeries on on left, 2 surgeries on right   . Rt knee arthroscopic surgery    . Back surgery      3 cervical spine surgeries C4-C5 fused  . Hernia repair    . Finger surgery Left     2nd, 3rd, & 4th fingers were cut off by table saw and reattached  . Colonoscopy N/A 01/04/2014    Procedure: COLONOSCOPY;  Surgeon: Danie Binder, MD;  [facility]: [facility];  Service: Endoscopy;  Laterality: N/A;  1:45  . Esophagogastroduodenoscopy N/A 01/04/2014    Procedure: ESOPHAGOGASTRODUODENOSCOPY (EGD);  Surgeon: Danie Binder, MD;  Location: [facility];  Service: Endoscopy;  Laterality: N/A;  . Incisional hernia repair N/A 01/20/2014    Procedure: LAPAROSCOPIC RECURRENT  INCISIONAL HERNIA with mesh;  Surgeon: Edward Jolly, MD;  Location: [facility];  Service: General;  Laterality: N/A;    in for   Chief Complaint  Patient presents with  . Emesis     HPI  Samuel Reyes  is a 59 y.o. male, with a past medical history of alcoholism,  suicide attempt, gastrectomy for peptic ulcer disease, TIA, COPD.  He presents to the ER with 5-6 days of vomiting and epigastric pain.  He reports subjective fevers and feeling weak.  He has been taking goodies powders to relieve his abdominal pain.   He denies blood in his emesis and stool.  He denies diarrhea. He drinks 24 - 40 beers daily but at this time denies hard alcohol and recreational drugs.  [name] has had multiple [facility] admissions after the death of his two sons and wife.   Most recently he was admitted from 1/3 - 08/09/14.   He regularly attends AA, but appears to have been unsuccessful in his attempts to combat his severe depression and alcoholism.  Review of Systems   In addition to  the HPI above,  No Headache, No changes with Vision or hearing, No problems swallowing food or Liquids, No Chest pain, Cough or Shortness of Breath, No Blood in stool or Urine, No dysuria  A full 10 point Review of Systems was done, except as stated above, all other Review of Systems were negative.  Social History History  Substance Use Topics  . Smoking status: Current Every Day Smoker -- 1.00 packs/day for 45 years    Types: Cigarettes    Start date: 07/30/1966  . Smokeless tobacco: Never Used  . Alcohol Use: 3.6 oz/week    6 Cans of beer per week     Comment: 1 case of beer and 1 shot of vodka between 1030am-1430pm  24 - 40 Beers per day.  Family History Family History  Problem Relation Age of Onset  . Cancer Father     bone  . Cancer Brother     lungs  . Stroke Maternal Grandmother   . Colon cancer Neg Hx   . Asthma Son     died at age 67 in his sleep   . Spina bifida Son     died at age 31   Father had bone cancer, bother had cirrhosis and bone cancer.  Mother is living.  Prior to Admission medications   Medication Sig Start Date End Date Taking? Authorizing Provider  acamprosate (CAMPRAL) 333 MG tablet Take 2 tablets (666 mg total) by mouth 3 (three) times daily with meals.  For alcoholism 08/08/14  Yes Shuvon Rankin, NP  albuterol (PROVENTIL HFA;VENTOLIN HFA) 108 (90 BASE) MCG/ACT inhaler Inhale 2 puffs into the lungs every 6 (six) hours as needed for wheezing or shortness of breath. 03/12/14  Yes Encarnacion Slates, NP  albuterol-ipratropium (COMBIVENT) 18-103 MCG/ACT inhaler Inhale 2 puffs into the lungs 2 (two) times daily. For shortness of breath 03/12/14  Yes Encarnacion Slates, NP  ARIPiprazole (ABILIFY) 5 MG tablet Take 5 mg by mouth daily.   Yes Historical Provider, MD  famotidine (PEPCID) 20 MG tablet Take 1 tablet (20 mg total) by mouth 2 (two) times daily. 08/08/14  Yes Shuvon Rankin, NP  FLUoxetine (PROZAC) 40 MG capsule Take 2 capsules (80 mg total) by mouth daily. For depression 08/08/14  Yes Shuvon Rankin, NP  gabapentin (NEURONTIN) 300 MG capsule Take 1 capsule (300 mg total) by mouth 2 (two) times daily. For substance withdrawal syndrome 08/08/14  Yes Shuvon Rankin, NP  metoprolol tartrate (LOPRESSOR) 25 MG tablet Take 1 tablet (25 mg total) by mouth 2 (two) times daily. For high blood pressure 03/12/14  Yes Encarnacion Slates, NP  naproxen (NAPROSYN) 500 MG tablet Take 500 mg by mouth 2 (two) times daily with a meal.   Yes Historical Provider, MD  nicotine (NICODERM CQ - DOSED IN MG/24 HOURS) 21 mg/24hr patch Place 21 mg onto the skin daily.   Yes Historical Provider, MD  trazodone (DESYREL) 150 MG tablet Take 2 tablets (300 mg total) by mouth at bedtime. For sleep 08/08/14  Yes Shuvon Rankin, NP    Allergies  Allergen Reactions  . Bee Venom Anaphylaxis  . Penicillins Rash    Physical Exam  Vitals  Blood pressure 159/75, pulse 86, temperature 98.6 F (37 C), temperature source Oral, resp. rate 24, height 5\' 4"  (1.626 m), weight 72.576 kg (160 lb), SpO2 94 %.   General:  Wd male, lying in bed.  NAD  Non toxic appearing.  Psych:  Normal affect and insight, Not Suicidal or  Homicidal, Awake Alert, Oriented X 3.  Neuro:   No F.N deficits, ALL C.Nerves Intact,  Strength 5/5 all 4 extremities, Sensation intact all 4 extremities.  No asterixis.    ENT:  Ears and Eyes appear Normal, Conjunctivae clear, PER.   Neck:  Supple, No lymphadenopathy appreciated  Respiratory:  Coarse expiratory breath sounds in the lower right posterior lobe.  No increased work of breathing.  Cardiac:  RRR, No Murmurs, no LE edema noted, no JVD.    Abdomen:  Positive bowel sounds, Soft, Non tender, Non distended,  Well healed scar, no caput medusa  Skin:  No Cyanosis, Normal Skin Turgor, No Skin Rash.   Extremities:  Able to move all 4. 5/5 strength in each, no effusions.  Data Review  CBC  Recent Labs Lab 08/31/14 0430  WBC 12.8*  HGB 12.5*  HCT 36.0*  PLT 336  MCV 83.1  MCH 28.9  MCHC 34.7  RDW 13.9  LYMPHSABS 1.1  MONOABS 1.4*  EOSABS 0.0  BASOSABS 0.0    Chemistries   Recent Labs Lab 08/31/14 0430  NA 125*  K 2.5*  CL 86*  CO2 30  GLUCOSE 98  BUN 6  CREATININE 0.67  CALCIUM 7.3*  MG 1.8  AST 16  ALT 11  ALKPHOS 71  BILITOT 0.8    Cardiac Enzymes  Recent Labs Lab 08/31/14 0430  TROPONINI 0.03     Urinalysis    Component Value Date/Time   COLORURINE YELLOW 08/03/2014 1928   APPEARANCEUR CLEAR 08/03/2014 1928   LABSPEC 1.009 08/03/2014 1928   PHURINE 5.5 08/03/2014 1928   GLUCOSEU NEGATIVE 08/03/2014 1928   HGBUR NEGATIVE 08/03/2014 1928   BILIRUBINUR NEGATIVE 08/03/2014 1928   KETONESUR NEGATIVE 08/03/2014 1928   PROTEINUR NEGATIVE 08/03/2014 1928   UROBILINOGEN 0.2 08/03/2014 1928   NITRITE NEGATIVE 08/03/2014 1928   LEUKOCYTESUR NEGATIVE 08/03/2014 1928    Imaging results:   Dg Abd Acute W/chest  08/31/2014   CLINICAL DATA:  Abdominal pain, nausea, vomiting, diarrhea. Symptoms for 5 days.  EXAM: ACUTE ABDOMEN SERIES (ABDOMEN 2 VIEW & CHEST 1 VIEW)  COMPARISON:  Chest radiographs 07/20/2014  FINDINGS: The cardiomediastinal contours are normal. There is decreased bronchial thickening, however small patchy opacity  in the right lower lobe. There is no free intra-abdominal air. Mild gaseous distention of bowel loops in the central abdomen, no disproportionate bowel dilatation to suggest obstruction. No radiopaque calculi. Surgical clips are seen in the region of the gastroesophageal junction. No acute osseous abnormalities are seen.  IMPRESSION: 1. Minimal patchy airspace opacity right lower lobe concerning for pneumonia. 2. Mild nonspecific gaseous distention of bowel loops. This may reflect ileus or enteritis. No findings of bowel obstruction. No free air.   Electronically Signed   By: Jeb Levering M.D.   On: 08/31/2014 06:02    My personal review of EKG: NSR, unchanged since last tracing.   Assessment & Plan  Principal Problem:   HCAP (healthcare-associated pneumonia) Active Problems:   Alcohol dependence   Major depression, recurrent   Persistent vomiting   Hypokalemia  HCAP vs Asp PNA Blood cultures have been obtained.  Vanc and Aztreonam per pharmacy (PCN allergy).  PNA order set used (Sputum culture, Urine for legionella & strep, HIV).  Duonebs, mucolytics, This may have been aspiration pna from vomiting as well.  Alcohol dependence He has a history of withdraw and there fore has been placed on scheduled ativan in addition to CIWA protocol.  Will Consult social work.  Persistant vomiting Xray shows gaseous distention of his bowel loops.  Given K of 2.5 he may have an early ileus.  Also he has been using goodies/using alcohol - creating concern for recurrent PUD.  Will make him NPO (w/ ice chips), IV protonix, replete K & mag.  If he does not improve as expected a GI consult may be appropriate.  (He sees Dr. Barney Drain in [place])  Hypokalemia Due to GI losses.  Will replete.  Repeat BMET later today.  Severe depression Continue home meds (trazodone, Prozac).    Hx of TIA 81 mg aspirin daily.  HTN Continue metoprolol    DVT Prophylaxis: Lovenox  AM Labs Ordered, also  please review Full Orders  Family Communication:   Patient is alert and orientated.  Code Status:  DNR  Condition:  Guarded.  Likely to withdraw.  Time spent in minutes : 60   York, Bobby Rumpf PA-C on 08/31/2014 at 9:17 AM  Between 7am to 7pm - Pager - [phone]  After 7pm go to [email or web address] - password [email or web address]  And look for the night coverage person covering me after hours  [facility]   Attending Patient was seen, examined,treatment plan was discussed with the  Advance Practice Provider.  I have directly reviewed the clinical findings, lab, imaging studies and management of this patient in detail. I have made the necessary changes to the above noted documentation, and agree with the documentation, as recorded by the Advance Practice Provider.   59 year old alcoholic white male, coming in for a five-day history of persistent vomiting. Abdominal x-ray negative for acute abnormalities, he has a small pneumonia on the right side. I suspect that this is probably related to alcohol-related gastritis, pneumonia may just be an incidental finding or he may have aspirated. Keep nothing by mouth, supportive measures, empiric antibiotics for pneumonia for now. Repeat abdominal x-ray in a.m., and follow clinical course. Although alcoholic and his abdominal pain, lipase negative making pancreatitis unlikely. If he continues to have vomiting, and abdominal pain in spite of being nothing by mouth, a CT scan of the abdomen may need to be ordered and a GI consultation may need to be placed.  Nena Alexander MD [facility].

## 2014-08-31 NOTE — Evaluation (Signed)
EDP: [facility]  Alcoholic Vomiting and not eating K 2.5 + PNA on CXR  Vanc and zosyn (aspiration coverage)-HCAP (recent admission) Banana bag CIWA Replete K  Mag level  ? Ileus on KUB-physical exam to coorelate

## 2014-08-31 NOTE — ED Provider Notes (Signed)
CSN: [id number]     Arrival date & time 08/31/14  0208 History   First MD Initiated Contact with Patient 08/31/14 [phone]     Chief Complaint  Patient presents with  . Emesis     (Consider location/radiation/quality/duration/timing/severity/associated sxs/prior Treatment) HPI Patient reports vomiting for the past 6 days. He vomited approximate 10 times today. He was treated by EMS with intravenous Zofran with relief of symptoms he also complains of diffuse abdominal discomfort and sore throat worse with vomiting he denies chest pain denies shortness of breath denies other complaint he last ate 3 days ago. He last bowel movement was yesterday, normal. No other associated symptoms. He treated himself with goody powder, without relief Last alcohol intake last night, 5 beers Past Medical History  Diagnosis Date  . Cirrhosis   . Degenerative joint disease   . Hypertension   . Gastric ulcer   . Depression   . Asthma   . Mitral valve prolapse 2002  . H/O hiatal hernia   . COPD (chronic obstructive pulmonary disease)   . TIA (transient ischemic attack)     2010  . Stroke     TIA - 2010 - no deficits   . Shortness of breath     with exertion   . GERD (gastroesophageal reflux disease)   . Headache(784.0)   . Hiatal hernia 1982  . Degenerative joint disease   . Anxiety   . Alcohol abuse    Past Surgical History  Procedure Laterality Date  . Gastrectomy    . Shoulder surgery Bilateral     3 surgeries on on left, 2 surgeries on right   . Rt knee arthroscopic surgery    . Back surgery      3 cervical spine surgeries C4-C5 fused  . Hernia repair    . Finger surgery Left     2nd, 3rd, & 4th fingers were cut off by table saw and reattached  . Colonoscopy N/A 01/04/2014    Procedure: COLONOSCOPY;  Surgeon: Danie Binder, MD;  [facility];  Service: Endoscopy;  Laterality: N/A;  1:45  . Esophagogastroduodenoscopy N/A 01/04/2014    Procedure: ESOPHAGOGASTRODUODENOSCOPY (EGD);   Surgeon: Danie Binder, MD;  [facility]: [facility];  Service: Endoscopy;  Laterality: N/A;  . Incisional hernia repair N/A 01/20/2014    Procedure: LAPAROSCOPIC RECURRENT  INCISIONAL HERNIA with mesh;  Surgeon: Edward Jolly, MD;  Location: [facility];  Service: General;  Laterality: N/A;   Family History  Problem Relation Age of Onset  . Cancer Father     bone  . Cancer Brother     lungs  . Stroke Maternal Grandmother   . Colon cancer Neg Hx   . Asthma Son     died at age 28 in his sleep   . Spina bifida Son     died at age 29    History  Substance Use Topics  . Smoking status: Current Every Day Smoker -- 1.00 packs/day for 45 years    Types: Cigarettes    Start date: 07/30/1966  . Smokeless tobacco: Never Used  . Alcohol Use: 3.6 oz/week    6 Cans of beer per week     Comment: 1 case of beer and 1 shot of vodka between 1030am-1430pm    Review of Systems  Constitutional: Positive for fever.       Reports fever 101 yesterday morning  HENT: Positive for sore throat.   Respiratory: Negative.   Cardiovascular:  Negative.   Gastrointestinal: Positive for nausea, vomiting and abdominal pain.  Musculoskeletal: Negative.   Skin: Negative.   Neurological: Negative.   Psychiatric/Behavioral: Negative.   All other systems reviewed and are negative.     Allergies  Bee venom and Penicillins  Home Medications   Prior to Admission medications   Medication Sig Start Date End Date Taking? Authorizing Provider  acamprosate (CAMPRAL) 333 MG tablet Take 2 tablets (666 mg total) by mouth 3 (three) times daily with meals. For alcoholism 08/08/14  Yes Shuvon Rankin, NP  albuterol (PROVENTIL HFA;VENTOLIN HFA) 108 (90 BASE) MCG/ACT inhaler Inhale 2 puffs into the lungs every 6 (six) hours as needed for wheezing or shortness of breath. 03/12/14  Yes Encarnacion Slates, NP  albuterol-ipratropium (COMBIVENT) 18-103 MCG/ACT inhaler Inhale 2 puffs into the lungs 2 (two) times daily. For shortness  of breath 03/12/14  Yes Encarnacion Slates, NP  ARIPiprazole (ABILIFY) 5 MG tablet Take 5 mg by mouth daily.   Yes Historical Provider, MD  famotidine (PEPCID) 20 MG tablet Take 1 tablet (20 mg total) by mouth 2 (two) times daily. 08/08/14  Yes Shuvon Rankin, NP  FLUoxetine (PROZAC) 40 MG capsule Take 2 capsules (80 mg total) by mouth daily. For depression 08/08/14  Yes Shuvon Rankin, NP  gabapentin (NEURONTIN) 300 MG capsule Take 1 capsule (300 mg total) by mouth 2 (two) times daily. For substance withdrawal syndrome 08/08/14  Yes Shuvon Rankin, NP  metoprolol tartrate (LOPRESSOR) 25 MG tablet Take 1 tablet (25 mg total) by mouth 2 (two) times daily. For high blood pressure 03/12/14  Yes Encarnacion Slates, NP  naproxen (NAPROSYN) 500 MG tablet Take 500 mg by mouth 2 (two) times daily with a meal.   Yes Historical Provider, MD  nicotine (NICODERM CQ - DOSED IN MG/24 HOURS) 21 mg/24hr patch Place 21 mg onto the skin daily.   Yes Historical Provider, MD  trazodone (DESYREL) 150 MG tablet Take 2 tablets (300 mg total) by mouth at bedtime. For sleep 08/08/14  Yes Shuvon Rankin, NP   BP 155/86 mmHg  Pulse 82  Temp(Src) 98.6 F (37 C) (Oral)  Resp 18  Ht 5\' 4"  (1.626 m)  Wt 160 lb (72.576 kg)  BMI 27.45 kg/m2  SpO2 95% Physical Exam  Constitutional: No distress.  Chronically ill-appearing  HENT:  Head: Normocephalic and atraumatic.  Mucous membranes dry  Eyes: Conjunctivae are normal. Pupils are equal, round, and reactive to light.  Neck: Neck supple. No tracheal deviation present. No thyromegaly present.  Cardiovascular: Normal rate and regular rhythm.   No murmur heard. Pulmonary/Chest: Effort normal and breath sounds normal.  Abdominal: Soft. Bowel sounds are normal. He exhibits no distension. There is no tenderness.  Midline surgical scar  Musculoskeletal: Normal range of motion. He exhibits no edema or tenderness.  Neurological: He is alert. Coordination normal.  Skin: Skin is warm and dry. No rash  noted.  Psychiatric: He has a normal mood and affect.  Nursing note and vitals reviewed.   ED Course  Procedures (including critical care time) Labs Review Labs Reviewed - No data to display  Imaging Review No results found.   EKG Interpretation   Date/Time:  Tuesday August 31 2014 04:18:30 EST Ventricular Rate:  82 PR Interval:  136 QRS Duration: 109 QT Interval:  440 QTC Calculation: 514 R Axis:   70 Text Interpretation:  Sinus rhythm Abnormal R-wave progression, early  transition Prolonged QT interval No significant change since last tracing  Confirmed  by Winfred Leeds  MD, [place] [phone]) on 08/31/2014 4:22:55 AM     Patient received of throat pain no relief after treatment with GI cocktail. Results for orders placed or performed during the hospital encounter of 08/31/14  Troponin I  Result Value Ref Range   Troponin I 0.03 <0.031 ng/mL  Comprehensive metabolic panel  Result Value Ref Range   Sodium 125 (L) 135 - 145 mmol/L   Potassium 2.5 (LL) 3.5 - 5.1 mmol/L   Chloride 86 (L) 96 - 112 mmol/L   CO2 30 19 - 32 mmol/L   Glucose, Bld 98 70 - 99 mg/dL   BUN 6 6 - 23 mg/dL   Creatinine, Ser 0.67 0.50 - 1.35 mg/dL   Calcium 7.3 (L) 8.4 - 10.5 mg/dL   Total Protein 5.6 (L) 6.0 - 8.3 g/dL   Albumin 3.0 (L) 3.5 - 5.2 g/dL   AST 16 0 - 37 U/L   ALT 11 0 - 53 U/L   Alkaline Phosphatase 71 39 - 117 U/L   Total Bilirubin 0.8 0.3 - 1.2 mg/dL   GFR calc non Af Amer >90 >90 mL/min   GFR calc Af Amer >90 >90 mL/min   Anion gap 9 5 - 15  CBC with Differential/Platelet  Result Value Ref Range   WBC 12.8 (H) 4.0 - 10.5 K/uL   RBC 4.33 4.22 - 5.81 MIL/uL   Hemoglobin 12.5 (L) 13.0 - 17.0 g/dL   HCT 36.0 (L) 39.0 - 52.0 %   MCV 83.1 78.0 - 100.0 fL   MCH 28.9 26.0 - 34.0 pg   MCHC 34.7 30.0 - 36.0 g/dL   RDW 13.9 11.5 - 15.5 %   Platelets 336 150 - 400 K/uL   Neutrophils Relative % 80 (H) 43 - 77 %   Neutro Abs 10.3 (H) 1.7 - 7.7 K/uL   Lymphocytes Relative 9 (L) 12 - 46 %    Lymphs Abs 1.1 0.7 - 4.0 K/uL   Monocytes Relative 11 3 - 12 %   Monocytes Absolute 1.4 (H) 0.1 - 1.0 K/uL   Eosinophils Relative 0 0 - 5 %   Eosinophils Absolute 0.0 0.0 - 0.7 K/uL   Basophils Relative 0 0 - 1 %   Basophils Absolute 0.0 0.0 - 0.1 K/uL  Lipase, blood  Result Value Ref Range   Lipase 22 11 - 59 U/L   Dg Abd Acute W/chest  08/31/2014   CLINICAL DATA:  Abdominal pain, nausea, vomiting, diarrhea. Symptoms for 5 days.  EXAM: ACUTE ABDOMEN SERIES (ABDOMEN 2 VIEW & CHEST 1 VIEW)  COMPARISON:  Chest radiographs 07/20/2014  FINDINGS: The cardiomediastinal contours are normal. There is decreased bronchial thickening, however small patchy opacity in the right lower lobe. There is no free intra-abdominal air. Mild gaseous distention of bowel loops in the central abdomen, no disproportionate bowel dilatation to suggest obstruction. No radiopaque calculi. Surgical clips are seen in the region of the gastroesophageal junction. No acute osseous abnormalities are seen.  IMPRESSION: 1. Minimal patchy airspace opacity right lower lobe concerning for pneumonia. 2. Mild nonspecific gaseous distention of bowel loops. This may reflect ileus or enteritis. No findings of bowel obstruction. No free air.   Electronically Signed   By: Jeb Levering M.D.   On: 08/31/2014 06:02    MDM  I suspect patchy infiltrate seen on x-ray secondary to aspiration in light of patient's persistent vomiting, however we'll treat forHCAP given recent hospitalization Final diagnoses:  None   spoke with Dr.  Newton plan admit telemetry intravenous antibiotics blood cultures pending repletion of electrolytes. Watch for alcohol withdrawal. Ciwa protocol Diagnoses #1 persistent vomiting #2 healthcare associated pneumonia #3 hyponatremia #4 hypokalemia #5 alcohol abuse     Orlie Dakin, MD 08/31/14 [phone]

## 2014-08-31 NOTE — Progress Notes (Signed)
Nutrition Brief Note  Patient identified on the Malnutrition Screening Tool (MST) Report  Wt Readings from Last 15 Encounters:  08/31/14 160 lb (72.576 kg)  08/02/14 155 lb (70.308 kg)  07/23/14 156 lb 4.9 oz (70.9 kg)  04/15/14 150 lb (68.04 kg)  03/09/14 144 lb (65.318 kg)  02/24/14 154 lb 6.4 oz (70.035 kg)  01/21/14 160 lb (72.576 kg)  01/19/14 161 lb (73.029 kg)  01/04/14 155 lb (70.308 kg)  12/29/13 156 lb 12.8 oz (71.124 kg)  12/23/13 156 lb 6.4 oz (70.943 kg)  11/26/13 162 lb 9.6 oz (73.755 kg)  11/23/13 161 lb (73.029 kg)  10/28/13 161 lb 12.8 oz (73.392 kg)  06/15/13 162 lb (73.483 kg)    Body mass index is 27.45 kg/(m^2). Patient meets criteria for Overweight based on current BMI.   Current diet order is NPO, patient is consuming approximately 0% of meals at this time because of NPO status.  Pt reports not change in appetite or weight loss prior to onset of nausea and vomiting. Records show stable weight. Pt shows no signs of fat or muscle depletion.  Labs and medications reviewed.   No nutrition interventions warranted at this time. If nutrition issues arise, please consult RD.   Samuel Picker MS Dietetic Intern Pager Number [phone]

## 2014-09-01 ENCOUNTER — Inpatient Hospital Stay (HOSPITAL_COMMUNITY): Payer: Medicaid Other

## 2014-09-01 DIAGNOSIS — F1020 Alcohol dependence, uncomplicated: Secondary | ICD-10-CM

## 2014-09-01 DIAGNOSIS — J189 Pneumonia, unspecified organism: Principal | ICD-10-CM

## 2014-09-01 DIAGNOSIS — E876 Hypokalemia: Secondary | ICD-10-CM

## 2014-09-01 DIAGNOSIS — R1110 Vomiting, unspecified: Secondary | ICD-10-CM

## 2014-09-01 DIAGNOSIS — F332 Major depressive disorder, recurrent severe without psychotic features: Secondary | ICD-10-CM

## 2014-09-01 DIAGNOSIS — E871 Hypo-osmolality and hyponatremia: Secondary | ICD-10-CM

## 2014-09-01 LAB — COMPREHENSIVE METABOLIC PANEL
ALBUMIN: 2.4 g/dL — AB (ref 3.5–5.2)
ALT: 8 U/L (ref 0–53)
AST: 13 U/L (ref 0–37)
Alkaline Phosphatase: 58 U/L (ref 39–117)
Anion gap: 4 — ABNORMAL LOW (ref 5–15)
BILIRUBIN TOTAL: 0.4 mg/dL (ref 0.3–1.2)
BUN: <5 mg/dL — ABNORMAL LOW (ref 6–23)
CO2: 27 mmol/L (ref 19–32)
Calcium: 7.6 mg/dL — ABNORMAL LOW (ref 8.4–10.5)
Chloride: 103 mmol/L (ref 96–112)
Creatinine, Ser: 0.79 mg/dL (ref 0.50–1.35)
GFR calc Af Amer: >90 mL/min (ref >90)
Glucose, Bld: 111 mg/dL — ABNORMAL HIGH (ref 70–99)
Potassium: 3.4 mmol/L — ABNORMAL LOW (ref 3.5–5.1)
Sodium: 134 mmol/L — ABNORMAL LOW (ref 135–145)
Total Protein: 4.8 g/dL — ABNORMAL LOW (ref 6.0–8.3)

## 2014-09-01 LAB — CBC
HCT: 32.6 % — ABNORMAL LOW (ref 39.0–52.0)
Hemoglobin: 10.8 g/dL — ABNORMAL LOW (ref 13.0–17.0)
MCH: 29 pg (ref 26.0–34.0)
MCHC: 33.1 g/dL (ref 30.0–36.0)
MCV: 87.4 fL (ref 78.0–100.0)
Platelets: 303 10*3/uL (ref 150–400)
RBC: 3.73 MIL/uL — AB (ref 4.22–5.81)
RDW: 14.5 % (ref 11.5–15.5)
WBC: 8.5 10*3/uL (ref 4.0–10.5)

## 2014-09-01 LAB — LEGIONELLA ANTIGEN, URINE

## 2014-09-01 LAB — HIV ANTIBODY (ROUTINE TESTING W REFLEX): HIV SCREEN 4TH GENERATION: NONREACTIVE

## 2014-09-01 MED ORDER — MAGNESIUM SULFATE 2 GM/50ML IV SOLN
2.0000 g | Freq: Once | INTRAVENOUS | Status: AC
  Administered 2014-09-01: 2 g via INTRAVENOUS
  Filled 2014-09-01: qty 50

## 2014-09-01 NOTE — Progress Notes (Signed)
[facility] PROGRESS NOTE   Samuel Reyes BEM:[id number] DOB: [birth year] DOA: 08/31/2014 PCP: Lorayne Marek, MD  HPI/Subjective: Reported that he is sleepy, not a lot of abdominal pain today but is still nauseous.  Assessment/Plan: Principal Problem:   HCAP (healthcare-associated pneumonia) Active Problems:   Alcohol dependence   Major depression, recurrent   Persistent vomiting   Hypokalemia   Hyponatremia   HCAP vs Asp PNA Blood cultures have been obtained.  Vanc and Aztreonam per pharmacy (PCN allergy).  PNA order set used (Sputum culture, Urine for legionella & strep, HIV).  Supportive management is started, Duonebs, mucolytics, This may have been aspiration pna from vomiting as well.  Alcohol dependence He has a history of withdraw and there fore has been placed on scheduled ativan in addition to CIWA protocol.  Will Consult social work.  Persistant vomiting Xray shows gaseous distention of his bowel loops. Given K of 2.5 he may have an early ileus.  Likely NSAIDs versus alcoholic gastritis, patient uses Goody powder every day. Continue nothing by mouth except ice chips, IV Protonix. Continue current management, GI consult if not improving.  Hypokalemia Likely secondary to GI loss as well as kaliuresis from alcohol. Replete orally, potassium 3.4 check BMP in a.m. Keep magnesium above 2.0.  Severe depression Continue home meds (trazodone, Prozac).   Hx of TIA 81 mg aspirin daily.  HTN Continue metoprolol  Code Status: DNR Family Communication: Plan discussed with the patient. Disposition Plan: Remains inpatient Diet: Diet clear liquid  Consultants:  None  Procedures:  None  Antibiotics:  None   Objective: Filed Vitals:   09/01/14 1208  BP: 139/72  Pulse:   Temp:   Resp:     Intake/Output Summary (Last 24 hours) at 09/01/14 1516 Last data filed at 09/01/14 1500  Gross per 24 hour  Intake 3614.67 ml  Output   3105 ml    Net 509.67 ml   Filed Weights   08/31/14 0219  Weight: 72.576 kg (160 lb)    Exam: General: Alert and awake, oriented x3, not in any acute distress. HEENT: anicteric sclera, pupils reactive to light and accommodation, EOMI CVS: S1-S2 clear, no murmur rubs or gallops Chest: clear to auscultation bilaterally, no wheezing, rales or rhonchi Abdomen: soft nontender, nondistended, normal bowel sounds, no organomegaly Extremities: no cyanosis, clubbing or edema noted bilaterally Neuro: Cranial nerves II-XII intact, no focal neurological deficits  Data Reviewed: Basic Metabolic Panel:  Recent Labs Lab 08/31/14 0430 08/31/14 1510 09/01/14 0725  NA 125* 133* 134*  K 2.5* 3.3* 3.4*  CL 86* 100 103  CO2 30 29 27   GLUCOSE 98 119* 111*  BUN 6 5* <5*  CREATININE 0.67 0.74 0.79  CALCIUM 7.3* 7.2* 7.6*  MG 1.8  --   --    Liver Function Tests:  Recent Labs Lab 08/31/14 0430 09/01/14 0725  AST 16 13  ALT 11 8  ALKPHOS 71 58  BILITOT 0.8 0.4  PROT 5.6* 4.8*  ALBUMIN 3.0* 2.4*    Recent Labs Lab 08/31/14 0430  LIPASE 22   No results for input(s): AMMONIA in the last 168 hours. CBC:  Recent Labs Lab 08/31/14 0430 09/01/14 0725  WBC 12.8* 8.5  NEUTROABS 10.3*  --   HGB 12.5* 10.8*  HCT 36.0* 32.6*  MCV 83.1 87.4  PLT 336 303   Cardiac Enzymes:  Recent Labs Lab 08/31/14 0430  TROPONINI 0.03   BNP (last 3 results) No results for input(s): BNP in the last 8760 hours.  ProBNP (last 3 results)  Recent Labs  04/14/14 1911  PROBNP 49.4    CBG: No results for input(s): GLUCAP in the last 168 hours.  Micro Recent Results (from the past 240 hour(s))  Blood culture (routine x 2)     Status: None (Preliminary result)   Collection Time: 08/31/14  6:40 AM  Result Value Ref Range Status   Specimen Description BLOOD ARM LEFT  Final   Special Requests BOTTLES DRAWN AEROBIC AND ANAEROBIC 10CC  Final   Culture   Final           BLOOD CULTURE RECEIVED NO GROWTH  TO DATE CULTURE WILL BE HELD FOR 5 DAYS BEFORE ISSUING A FINAL NEGATIVE REPORT Performed at [facility]    Report Status PENDING  Incomplete  Blood culture (routine x 2)     Status: None (Preliminary result)   Collection Time: 08/31/14  6:45 AM  Result Value Ref Range Status   Specimen Description BLOOD HAND LEFT  Final   Special Requests BOTTLES DRAWN AEROBIC ONLY 10CC  Final   Culture   Final           BLOOD CULTURE RECEIVED NO GROWTH TO DATE CULTURE WILL BE HELD FOR 5 DAYS BEFORE ISSUING A FINAL NEGATIVE REPORT Performed at [facility]    Report Status PENDING  Incomplete     Studies: Dg Abd 2 Views  09/01/2014   CLINICAL DATA:  Vomiting for 5 days, epigastric pain  EXAM: ABDOMEN - 2 VIEW  COMPARISON:  08/31/2014  FINDINGS: No free abdominal air is noted. Residual mild gaseous distended small bowel loops with slight improvement from prior exam. Again findings suspicious for enteritis or mild ileus. No significant air-fluid levels.  IMPRESSION: Residual mild gaseous distended small bowel loops with slight improvement from prior exam suspicious for enteritis or ileus.   Electronically Signed   By: Lahoma Crocker M.D.   On: 09/01/2014 08:15   Dg Abd Acute W/chest  08/31/2014   CLINICAL DATA:  Abdominal pain, nausea, vomiting, diarrhea. Symptoms for 5 days.  EXAM: ACUTE ABDOMEN SERIES (ABDOMEN 2 VIEW & CHEST 1 VIEW)  COMPARISON:  Chest radiographs 07/20/2014  FINDINGS: The cardiomediastinal contours are normal. There is decreased bronchial thickening, however small patchy opacity in the right lower lobe. There is no free intra-abdominal air. Mild gaseous distention of bowel loops in the central abdomen, no disproportionate bowel dilatation to suggest obstruction. No radiopaque calculi. Surgical clips are seen in the region of the gastroesophageal junction. No acute osseous abnormalities are seen.  IMPRESSION: 1. Minimal patchy airspace opacity right lower lobe concerning for  pneumonia. 2. Mild nonspecific gaseous distention of bowel loops. This may reflect ileus or enteritis. No findings of bowel obstruction. No free air.   Electronically Signed   By: Jeb Levering M.D.   On: 08/31/2014 06:02    Scheduled Meds: . ARIPiprazole  5 mg Oral Daily  . aspirin  81 mg Oral Daily  . aztreonam  2 g Intravenous 3 times per day  . enoxaparin (LOVENOX) injection  40 mg Subcutaneous Q24H  . FLUoxetine  80 mg Oral Daily  . folic acid  1 mg Oral Daily  . gabapentin  300 mg Oral BID  . ipratropium-albuterol  3 mL Inhalation BID  . LORazepam  1 mg Intravenous TID  . LORazepam  0-4 mg Oral 4 times per day  . LORazepam  0-4 mg Oral Q12H  . metoprolol tartrate  25 mg Oral BID  .  multivitamin with minerals  1 tablet Oral Daily  . nicotine  21 mg Transdermal Daily  . pantoprazole (PROTONIX) IV  40 mg Intravenous Q24H  . sodium chloride  3 mL Intravenous Q12H  . thiamine  100 mg Oral Daily   Or  . thiamine  100 mg Intravenous Daily  . trazodone  300 mg Oral QHS  . vancomycin  750 mg Intravenous Q8H   Continuous Infusions: . 0.9 % NaCl with KCl 40 mEq / L 125 mL/hr (09/01/14 1328)       Time spent: 35 minutes    [facility]  [facility] Pager [phone] If 7PM-7AM, please contact night-coverage at [email or web address], password [facility] 09/01/2014, 3:16 PM  LOS: 1 day

## 2014-09-01 NOTE — Evaluation (Signed)
Occupational Therapy Evaluation Patient Details Name: Samuel Reyes MRN: [id number] DOB: [birth year] Today's Date: 09/01/2014    History of Present Illness Samuel Reyes  is a 59 y.o. male, with a past medical history of alcoholism, suicide attempt, gastrectomy for peptic ulcer disease, TIA, COPD.  He presents to the ER with 5-6 days of vomiting and epigastric pain.  He reports subjective fevers and feeling weak. He has been taking goodies powders to relieve his abdominal pain.   He denies blood in his emesis and stool.  He denies diarrhea. He drinks 24 - 40 beers daily but at this time denies hard alcohol and recreational drugs   Clinical Impression   Pt admitted with above. Pt unsteady on feet during evaluation. Pt independent with ADLs, PTA. Feel pt will benefit from acute OT to increase independence, activity tolerance, and strength prior to d/c.     Follow Up Recommendations  No OT follow up;Supervision - Intermittent    Equipment Recommendations  Tub/shower bench    Recommendations for Other Services       Precautions / Restrictions Precautions Precautions: Fall Restrictions Weight Bearing Restrictions: No      Mobility Bed Mobility Overal bed mobility: Modified Independent                Transfers Overall transfer level: Needs assistance   Transfers: Sit to/from Stand Sit to Stand: Supervision             Balance Pt unsteady on feet with ambulation-Min guard assist. Pt unsteady at sink as well-Supervision-Min guard.                     ADL Overall ADL's : Needs assistance/impaired     Grooming: Wash/dry face;Wash/dry hands;Oral care;Applying deodorant;Brushing hair;Set up;Supervision/safety;Standing   Upper Body Bathing: Set up;Supervision/ safety;Standing   Lower Body Bathing: Min guard;Sit to/from stand   Upper Body Dressing : Set up;Sitting   Lower Body Dressing: Min guard;Sit to/from stand   Toilet Transfer: Min guard;Ambulation  (bed)   Toileting- Clothing Manipulation and Hygiene: Min guard;Sit to/from stand       Functional mobility during ADLs: Min guard General ADL Comments: Educated on energy conservation techniques. Educated on safety such as safe shoewear, rugs, sitting for LB ADLs. Explained benefit of therapy.  Educated on tub bench and tub transfer technique. Pt performed grooming and bathing at sink and was able to donn/doff socks sitting EOB.     Vision  Pt wears glasses all the time, but does not have them here.                   Perception     Praxis      Pertinent Vitals/Pain Pain Assessment: 0-10 Pain Score: 8  Pain Location: head Pain Descriptors / Indicators: Headache Pain Intervention(s): Monitored during session;Other (comment) (notified nurse for pain meds)     Hand Dominance     Extremity/Trunk Assessment Upper Extremity Assessment Upper Extremity Assessment: Generalized weakness   Lower Extremity Assessment Lower Extremity Assessment: Defer to PT evaluation       Communication Communication Communication: No difficulties   Cognition Arousal/Alertness: Awake/alert Behavior During Therapy: WFL for tasks assessed/performed Overall Cognitive Status: Within Functional Limits for tasks assessed                     General Comments       Exercises       Shoulder Instructions      Home Living  Family/patient expects to be discharged to:: Private residence Living Arrangements: Other (Comment) (friends/roommates) Available Help at Discharge: Friend(s) Type of Home: Apartment Home Access: Stairs to enter [employer] of Steps: several Entrance Stairs-Rails: Right;Left Home Layout: One level     Bathroom Shower/Tub: Teacher, early years/pre: Standard     Home Equipment: None          Prior Functioning/Environment Level of Independence: Independent        Comments: pt knows he needs an assistive device.    OT  Diagnosis: Acute pain;Generalized weakness   OT Problem List: Decreased strength;Decreased activity tolerance;Impaired balance (sitting and/or standing);Decreased knowledge of use of DME or AE;Decreased knowledge of precautions;Pain   OT Treatment/Interventions: Self-care/ADL training;Therapeutic exercise;DME and/or AE instruction;Therapeutic activities;Patient/family education;Balance training;Energy conservation    OT Goals(Current goals can be found in the care plan section) Acute Rehab OT Goals Patient Stated Goal: get back to functioning OT Goal Formulation: With patient Time For Goal Achievement: 09/08/14 Potential to Achieve Goals: Good ADL Goals Pt Will Perform Upper Body Bathing: with modified independence;sitting;standing Pt Will Perform Lower Body Bathing: with modified independence;sit to/from stand Pt Will Perform Lower Body Dressing: with modified independence;sit to/from stand Pt Will Transfer to Toilet: with modified independence;ambulating Pt Will Perform Toileting - Clothing Manipulation and hygiene: with modified independence;sit to/from stand Additional ADL Goal #1: Pt will independently perform HEP to increase strength in Bilateral UE's.  OT Frequency: Min 2X/week   Barriers to D/C:            Co-evaluation              End of Session Equipment Utilized During Treatment: Gait belt Nurse Communication: Other (comment) (pt with headache)  Activity Tolerance: Patient tolerated treatment well Patient left: in bed;with call bell/phone within reach;with bed alarm set   Time: 7654-6503 OT Time Calculation (min): 17 min Charges:  OT General Charges $OT Visit: 1 Procedure OT Evaluation $Initial OT Evaluation Tier I: 1 Procedure G-CodesBenito Mccreedy OTR/L [id number] 09/01/2014, 4:28 PM

## 2014-09-01 NOTE — Plan of Care (Signed)
Problem: Phase I Progression Outcomes Goal: Initial discharge plan identified Outcome: Completed/Met Date Met:  09/01/14 To return home     

## 2014-09-01 NOTE — Evaluation (Signed)
Physical Therapy Evaluation Patient Details Name: Samuel Reyes MRN: [id number] DOB: 1955/09/30 Today's Date: 09/01/2014   History of Present Illness  Samuel Reyes  is a 59 y.o. male, with a past medical history of alcoholism, suicide attempt, gastrectomy for peptic ulcer disease, TIA, COPD.  He presents to the ER with 5-6 days of vomiting and epigastric pain.  He reports subjective fevers and feeling weak. He has been taking goodies powders to relieve his abdominal pain.   He denies blood in his emesis and stool.  He denies diarrhea. He drinks 24 - 40 beers daily but at this time denies hard alcohol and recreational drugs  Clinical Impression  Pt admitted with/for N/V, epigastric pain and weakness.  Pt currently limited functionally due to the problems listed below.  (see problems list.)  Pt will benefit from PT to maximize function and safety to be able to get home safely with available assist of friends/roommates.     Follow Up Recommendations Supervision - Intermittent    Equipment Recommendations  Cane    Recommendations for Other Services       Precautions / Restrictions Precautions Precautions: Fall      Mobility  Bed Mobility Overal bed mobility: Modified Independent                Transfers Overall transfer level: Needs assistance   Transfers: Sit to/from Stand Sit to Stand: Supervision         General transfer comment: staggers around to recover balance on initial standing ;  cued to hold to something and stay put  for a moment  Ambulation/Gait Ambulation/Gait assistance: Min guard Ambulation Distance (Feet): 160 Feet Assistive device: None Gait Pattern/deviations: Step-through pattern;Scissoring;Staggering right Gait velocity: prefers slower, but can speed up to command   General Gait Details: unsteady with wandering, scissoring to maintain balance.  Improved with increased speed.  Will need assistive device.  Stairs            Wheelchair  Mobility    Modified Rankin (Stroke Patients Only)       Balance Overall balance assessment: Needs assistance Sitting-balance support: No upper extremity supported Sitting balance-Leahy Scale: Fair     Standing balance support: No upper extremity supported Standing balance-Leahy Scale: Fair Standing balance comment: staggers and steps to maintain balance                             Pertinent Vitals/Pain Pain Assessment: No/denies pain    Home Living Family/patient expects to be discharged to:: Private residence Living Arrangements: Other (Comment) (friends/room mates) Available Help at Discharge: Friend(s) Type of Home: Apartment Home Access: Stairs to enter Entrance Stairs-Rails: Psychiatric nurse of Steps: several Home Layout: One level Home Equipment: None      Prior Function Level of Independence: Independent         Comments: pt knows he needs an assistive device.     Hand Dominance        Extremity/Trunk Assessment   Upper Extremity Assessment: Defer to OT evaluation           Lower Extremity Assessment: Overall WFL for tasks assessed (proximal weakness bil, but functional)         Communication      Cognition Arousal/Alertness: Awake/alert Behavior During Therapy: WFL for tasks assessed/performed Overall Cognitive Status: Within Functional Limits for tasks assessed  General Comments      Exercises        Assessment/Plan    PT Assessment Patient needs continued PT services  PT Diagnosis Difficulty walking;Generalized weakness   PT Problem List Decreased strength;Decreased activity tolerance;Decreased balance;Decreased mobility;Decreased knowledge of use of DME  PT Treatment Interventions Gait training;Functional mobility training;Therapeutic activities;Stair training;DME instruction;Balance training;Patient/family education   PT Goals (Current goals can be found in the Care  Plan section) Acute Rehab PT Goals Patient Stated Goal: get a cane or something to help me walk better. PT Goal Formulation: With patient Time For Goal Achievement: 09/08/14 Potential to Achieve Goals: Good    Frequency Min 3X/week   Barriers to discharge        Co-evaluation               End of Session   Activity Tolerance: Patient tolerated treatment well Patient left: Other (comment);with nursing/sitter in room;with call bell/phone within reach (sitting EOB) Nurse Communication: Mobility status         Time: 3662-9476 PT Time Calculation (min) (ACUTE ONLY): 20 min   Charges:   PT Evaluation $Initial PT Evaluation Tier I: 1 Procedure     PT G Codes:        Trinita Devlin, Tessie Fass 09/01/2014, 3:19 PM 09/01/2014  Donnella Sham, PT [phone]  (pager)

## 2014-09-01 NOTE — Plan of Care (Signed)
Problem: Phase I Progression Outcomes Goal: Initial discharge plan identified Outcome: Completed/Met Date Met:  09/01/14 To return home

## 2014-09-02 LAB — BASIC METABOLIC PANEL
ANION GAP: 3 — AB (ref 5–15)
CHLORIDE: 105 mmol/L (ref 96–112)
CO2: 27 mmol/L (ref 19–32)
CREATININE: 0.76 mg/dL (ref 0.50–1.35)
Calcium: 8 mg/dL — ABNORMAL LOW (ref 8.4–10.5)
GFR calc non Af Amer: >90 mL/min (ref >90)
Glucose, Bld: 94 mg/dL (ref 70–99)
Potassium: 4.3 mmol/L (ref 3.5–5.1)
SODIUM: 135 mmol/L (ref 135–145)

## 2014-09-02 LAB — CBC
HCT: 35 % — ABNORMAL LOW (ref 39.0–52.0)
Hemoglobin: 11.2 g/dL — ABNORMAL LOW (ref 13.0–17.0)
MCH: 28.4 pg (ref 26.0–34.0)
MCHC: 32 g/dL (ref 30.0–36.0)
MCV: 88.8 fL (ref 78.0–100.0)
PLATELETS: 328 10*3/uL (ref 150–400)
RBC: 3.94 MIL/uL — ABNORMAL LOW (ref 4.22–5.81)
RDW: 14.8 % (ref 11.5–15.5)
WBC: 12 10*3/uL — ABNORMAL HIGH (ref 4.0–10.5)

## 2014-09-02 LAB — MAGNESIUM: Magnesium: 1.8 mg/dL (ref 1.5–2.5)

## 2014-09-02 LAB — VANCOMYCIN, TROUGH: VANCOMYCIN TR: 14.7 ug/mL (ref 10.0–20.0)

## 2014-09-02 MED ORDER — GUAIFENESIN-DM 100-10 MG/5ML PO SYRP
5.0000 mL | ORAL_SOLUTION | ORAL | Status: DC | PRN
  Filled 2014-09-02: qty 5

## 2014-09-02 MED ORDER — GUAIFENESIN ER 600 MG PO TB12
1200.0000 mg | ORAL_TABLET | Freq: Two times a day (BID) | ORAL | Status: DC
  Administered 2014-09-02 – 2014-09-05 (×6): 1200 mg via ORAL
  Filled 2014-09-02 (×7): qty 2

## 2014-09-02 MED ORDER — VANCOMYCIN HCL IN DEXTROSE 1-5 GM/200ML-% IV SOLN
1000.0000 mg | Freq: Three times a day (TID) | INTRAVENOUS | Status: DC
  Administered 2014-09-02: 1000 mg via INTRAVENOUS
  Filled 2014-09-02 (×2): qty 200

## 2014-09-02 MED ORDER — PANTOPRAZOLE SODIUM 40 MG PO TBEC
40.0000 mg | DELAYED_RELEASE_TABLET | Freq: Every day | ORAL | Status: DC
  Administered 2014-09-03 – 2014-09-05 (×3): 40 mg via ORAL
  Filled 2014-09-02 (×3): qty 1

## 2014-09-02 MED ORDER — MAGNESIUM SULFATE 2 GM/50ML IV SOLN
2.0000 g | Freq: Once | INTRAVENOUS | Status: AC
  Administered 2014-09-02: 2 g via INTRAVENOUS
  Filled 2014-09-02: qty 50

## 2014-09-02 MED ORDER — VANCOMYCIN HCL IN DEXTROSE 1-5 GM/200ML-% IV SOLN
1000.0000 mg | Freq: Once | INTRAVENOUS | Status: AC
  Administered 2014-09-02: 1000 mg via INTRAVENOUS
  Filled 2014-09-02: qty 200

## 2014-09-02 NOTE — Progress Notes (Signed)
[facility] PROGRESS NOTE   Samuel Reyes BZJ:[id number] DOB: 04/11/1956 DOA: 08/31/2014 PCP: Lorayne Marek, MD  HPI: Samuel Reyes is a 59 y.o. male, with a past medical history of alcoholism, suicide attempt, gastrectomy for peptic ulcer disease, TIA, COPD. He presents to the ER with 5-6 days of vomiting and epigastric pain. He reports subjective fevers and feeling weak. He has been taking goodies powders to relieve his abdominal pain. He denies blood in his emesis and stool. He denies diarrhea. He drinks 24 - 40 beers daily but at this time denies hard alcohol and recreational drugs.  Samuel Reyes has had multiple [facility] admissions after the death of his two sons and wife. Most recently he was admitted from 1/3 - 08/09/14. He regularly attends AA, but appears to have been unsuccessful in his attempts to combat his severe depression and alcoholism.  Subjective: The patient is alert today and able to ambulate with PT. He feels better walking with a cane. He reports epigastric pain, probably due to his alcohol intake, neck pain, and headache. Otherwise stable. He feels that he is more awake today than the past few days.  He is on CIWA protocol, this morning score 2. He expresses that if he doesn't change, he probably only has 8-10 years left to live.  Assessment/Plan: Principal Problem:   HCAP (healthcare-associated pneumonia) Active Problems:   Alcohol dependence   Major depression, recurrent   Persistent vomiting   Hypokalemia   Hyponatremia   HCAP vs Asp PNA This may have been aspiration pna from vomiting as well. Blood cultures have been obtained. Results pending. Discontinue Vanc today. Continue Aztreonam.  PNA order set:  HIV screen non-reactive, Urine negative for Legionella and Strep pneumo, Awaiting sputum culture. Supportive management,, Duonebs, mucolytics.   Alcohol dependence He has a history of withdrawal; he reports his last drink was Monday so he  would reach 72 mark today, and therefore has been placed on scheduled ativan in addition to CIWA protocol.  Also Ativan PRN. Patient says he has never been in rehab but is interested in [place].  Consult social work.  Persistent vomiting Xray shows gaseous distention of his bowel loops. Given K of 2.5 at admission, he may have an early ileus.  Likely NSAIDs versus alcoholic gastritis, patient uses Goody powder every day. Today K is 4.3. He is not vomiting today. Has been upgraded to Dysphagia 3 diet. Change to oral Protonix. Continue current management, GI consult not needed today.   Hypokalemia Likely secondary to GI loss as well as kaliuresis from alcohol. Potassium 4.3 this morning. Magnesium 1.8. Will give 2 g magnesium today. Keep magnesium above 2.0. Recheck in a.m.  Severe depression Continue home meds (trazodone, Prozac).   Hx of TIA 81 mg aspirin daily.  HTN Continue metoprolol.  Code Status: DNR Family Communication: Plan discussed with the patient. Disposition Plan: Remains inpatient Diet: DIET DYS 3  Consultants:  None  Procedures:  None  Antibiotics:  Aztreonam 2/2>>  Vancomycin 2/2-2/4  Cefepime on 2/2 only   Objective: Filed Vitals:   09/02/14 1358  BP: 154/77  Pulse:   Temp: 98.1 F (36.7 C)  Resp: 16    Intake/Output Summary (Last 24 hours) at 09/02/14 1654 Last data filed at 09/02/14 1557  Gross per 24 hour  Intake 4622.33 ml  Output   4775 ml  Net -152.67 ml   Filed Weights   08/31/14 0219  Weight: 72.576 kg (160 lb)    Exam: General: Small stature, male,  alert and awake, AOx3, not in any acute distress, conversational.  HEENT: anicteric sclera, no eye/ear/nose drainage.  CVS: S1-S2 clear, no murmur rubs or gallops, pedal pulses 3+ Chest: Rhonchi in lower lobes, more prominent on right side.  Abdomen: soft, nondistended, tender to palpation in epigastric area.  Extremities: no cyanosis, clubbing or edema noted  bilaterally, 5/5 strength lower extremities   Data Reviewed: Basic Metabolic Panel:  Recent Labs Lab 08/31/14 0430 08/31/14 1510 09/01/14 0725 09/02/14 0720  NA 125* 133* 134* 135  K 2.5* 3.3* 3.4* 4.3  CL 86* 100 103 105  CO2 30 29 27 27   GLUCOSE 98 119* 111* 94  BUN 6 5* <5* <5*  CREATININE 0.67 0.74 0.79 0.76  CALCIUM 7.3* 7.2* 7.6* 8.0*  MG 1.8  --   --  1.8   Liver Function Tests:  Recent Labs Lab 08/31/14 0430 09/01/14 0725  AST 16 13  ALT 11 8  ALKPHOS 71 58  BILITOT 0.8 0.4  PROT 5.6* 4.8*  ALBUMIN 3.0* 2.4*    Recent Labs Lab 08/31/14 0430  LIPASE 22   No results for input(s): AMMONIA in the last 168 hours. CBC:  Recent Labs Lab 08/31/14 0430 09/01/14 0725 09/02/14 0720  WBC 12.8* 8.5 12.0*  NEUTROABS 10.3*  --   --   HGB 12.5* 10.8* 11.2*  HCT 36.0* 32.6* 35.0*  MCV 83.1 87.4 88.8  PLT 336 303 328   Cardiac Enzymes:  Recent Labs Lab 08/31/14 0430  TROPONINI 0.03   BNP (last 3 results) No results for input(s): BNP in the last 8760 hours.  ProBNP (last 3 results)  Recent Labs  04/14/14 1911  PROBNP 49.4    CBG: No results for input(s): GLUCAP in the last 168 hours.  Micro Recent Results (from the past 240 hour(s))  Blood culture (routine x 2)     Status: None (Preliminary result)   Collection Time: 08/31/14  6:40 AM  Result Value Ref Range Status   Specimen Description BLOOD ARM LEFT  Final   Special Requests BOTTLES DRAWN AEROBIC AND ANAEROBIC 10CC  Final   Culture   Final           BLOOD CULTURE RECEIVED NO GROWTH TO DATE CULTURE WILL BE HELD FOR 5 DAYS BEFORE ISSUING A FINAL NEGATIVE REPORT Performed at [facility]    Report Status PENDING  Incomplete  Blood culture (routine x 2)     Status: None (Preliminary result)   Collection Time: 08/31/14  6:45 AM  Result Value Ref Range Status   Specimen Description BLOOD HAND LEFT  Final   Special Requests BOTTLES DRAWN AEROBIC ONLY 10CC  Final   Culture   Final             BLOOD CULTURE RECEIVED NO GROWTH TO DATE CULTURE WILL BE HELD FOR 5 DAYS BEFORE ISSUING A FINAL NEGATIVE REPORT Performed at [facility]    Report Status PENDING  Incomplete     Studies: Dg Abd 2 Views  09/01/2014   CLINICAL DATA:  Vomiting for 5 days, epigastric pain  EXAM: ABDOMEN - 2 VIEW  COMPARISON:  08/31/2014  FINDINGS: No free abdominal air is noted. Residual mild gaseous distended small bowel loops with slight improvement from prior exam. Again findings suspicious for enteritis or mild ileus. No significant air-fluid levels.  IMPRESSION: Residual mild gaseous distended small bowel loops with slight improvement from prior exam suspicious for enteritis or ileus.   Electronically Signed   By:  Lahoma Crocker M.D.   On: 09/01/2014 08:15    Scheduled Meds: . ARIPiprazole  5 mg Oral Daily  . aspirin  81 mg Oral Daily  . aztreonam  2 g Intravenous 3 times per day  . enoxaparin (LOVENOX) injection  40 mg Subcutaneous Q24H  . FLUoxetine  80 mg Oral Daily  . folic acid  1 mg Oral Daily  . gabapentin  300 mg Oral BID  . ipratropium-albuterol  3 mL Inhalation BID  . LORazepam  1 mg Intravenous TID  . LORazepam  0-4 mg Oral Q12H  . metoprolol tartrate  25 mg Oral BID  . multivitamin with minerals  1 tablet Oral Daily  . nicotine  21 mg Transdermal Daily  . [START ON 09/03/2014] pantoprazole  40 mg Oral Daily  . sodium chloride  3 mL Intravenous Q12H  . thiamine  100 mg Oral Daily   Or  . thiamine  100 mg Intravenous Daily  . trazodone  300 mg Oral QHS   Continuous Infusions: . 0.9 % NaCl with KCl 40 mEq / L 125 mL/hr at 09/02/14 1600       Time spent: 35 minutes    Magda Bernheim, PA-S  [facility] Pager [phone] If 7PM-7AM, please contact night-coverage at [email or web address], password [facility] 09/02/2014, 4:54 PM  LOS: 2 days       Addendum  Patient seen and examined, chart and data base reviewed.  I agree with the above assessment and plan.  For  full details please see Mrs. Magda Bernheim, PA-S note.   Birdie Hopes, MD [facility] Pager: [phone] 09/02/2014, 5:20 PM

## 2014-09-02 NOTE — Progress Notes (Signed)
Occupational Therapy Treatment Patient Details Name: Samuel Reyes MRN: [id number] DOB: [birth year] Today's Date: 09/02/2014    History of present illness Samuel Reyes  is a 59 y.o. male, with a past medical history of alcoholism, suicide attempt, gastrectomy for peptic ulcer disease, TIA, COPD.  He presents to the ER with 5-6 days of vomiting and epigastric pain.  He reports subjective fevers and feeling weak. He has been taking goodies powders to relieve his abdominal pain.   He denies blood in his emesis and stool.  He denies diarrhea. He drinks 24 - 40 beers daily but at this time denies hard alcohol and recreational drugs   OT comments  Pt seen today for ADLs and balance during functional activities. Pt was eager to get OOB and perform grooming tasks at sink. Pt continues to present with balance deficits and educated pt on ways to increase safety given deficits. Pt also expressed a desire to d/c to [facility] treatment center and discussed healthy lifestyle habits and ways to promote recovery.    Follow Up Recommendations  No OT follow up;Supervision - Intermittent;Other (comment) (pt hopes to d/c to [place])    Equipment Recommendations    none recommended   Recommendations for Other Services   N/A    Precautions / Restrictions Precautions Precautions: Fall Restrictions Weight Bearing Restrictions: No       Mobility Bed Mobility Overal bed mobility: Modified Independent                Transfers Overall transfer level: Needs assistance   Transfers: Sit to/from Stand Sit to Stand: Supervision         General transfer comment: Supervision for safety upon standing. No LOB initially, however during ambulation pt with balance deficits.         ADL Overall ADL's : Needs assistance/impaired     Grooming: Wash/dry hands;Wash/dry face;Brushing hair;Supervision/safety;Set up;Standing Grooming Details (indicate cue type and reason):  pt stood at sink for 10 minutes for grooming tasks (including shaving) with no LOB even with head tilts for shaving. Pt bent over to floor to retrieve dropped item, holding onto the counter top for stability with no difficulty.                  Toilet Transfer: Min guard;Ambulation Toilet Transfer Details (indicate cue type and reason): min guard due to balance deficits. Pt with 1 LOB while ambulating and able to self correct         Functional mobility during ADLs: Min guard General ADL Comments: Pt stood at sink for grooming tasks. Discussed healthy lifestyle routines and pt is hoping to d/c to [facility] following inpatient stay. OT provided therapeutic listening and offered pt strategies to implement to promote success at [facility].                 Cognition  Arousal/Alertness: Awake/Alert Behavior During Therapy: WFL for tasks assessed/performed Overall Cognitive Status: Within Functional Limits for tasks assessed                                    Pertinent Vitals/ Pain       Pain Assessment: No/denies pain         Frequency Min 2X/week     Progress Toward Goals  OT Goals(current goals can now be found in the care plan section)  Progress towards OT goals: Progressing toward goals  Acute Rehab OT Goals Patient Stated Goal: to get myself clean and back to living right OT Goal Formulation: With patient Time For Goal Achievement: 09/08/14 Potential to Achieve Goals: Good  Plan Discharge plan remains appropriate       End of Session Equipment Utilized During Treatment: Gait belt   Activity Tolerance Patient tolerated treatment well   Patient Left in bed;with call bell/phone within reach   Nurse Communication          Time: 2751-7001 OT Time Calculation (min): 19 min  Charges: OT General Charges $OT Visit: 1 Procedure OT Treatments $Self Care/Home Management : 8-22 mins  Juluis Rainier 09/02/2014, 10:42 AM  Cyndie Chime,  OTR/L Occupational Therapist [phone] (pager)

## 2014-09-02 NOTE — Progress Notes (Signed)
ANTIBIOTIC CONSULT NOTE - FOLLOW UP  Pharmacy Consult for vancomycin Indication: pneumonia  Allergies  Allergen Reactions  . Bee Venom Anaphylaxis  . Penicillins Rash    Patient Measurements: Height: 5\' 4"  (162.6 cm) Weight: 160 lb (72.576 kg) IBW/kg (Calculated) : 59.2  Vital Signs: Temp: 98.5 F (36.9 C) (02/03 2328) Temp Source: Oral (02/03 2328) BP: 125/64 mmHg (02/03 2328) Pulse Rate: 69 (02/03 2328) Intake/Output from previous day: 02/03 0701 - 02/04 0700 In: 3731.8 [P.O.:1386; I.V.:1495.8; IV Piggyback:850] Out: 2355 [Urine:2355]  Labs:  Recent Labs  08/31/14 0430 08/31/14 1510 09/01/14 0725  WBC 12.8*  --  8.5  HGB 12.5*  --  10.8*  PLT 336  --  303  CREATININE 0.67 0.74 0.79   Estimated Creatinine Clearance: 90.8 mL/min (by C-G formula based on Cr of 0.79).  Recent Labs  09/01/14 2345  VANCOTROUGH 14.7     Microbiology: Recent Results (from the past 720 hour(s))  Blood culture (routine x 2)     Status: None (Preliminary result)   Collection Time: 08/31/14  6:40 AM  Result Value Ref Range Status   Specimen Description BLOOD ARM LEFT  Final   Special Requests BOTTLES DRAWN AEROBIC AND ANAEROBIC 10CC  Final   Culture   Final           BLOOD CULTURE RECEIVED NO GROWTH TO DATE CULTURE WILL BE HELD FOR 5 DAYS BEFORE ISSUING A FINAL NEGATIVE REPORT Performed at [facility]    Report Status PENDING  Incomplete  Blood culture (routine x 2)     Status: None (Preliminary result)   Collection Time: 08/31/14  6:45 AM  Result Value Ref Range Status   Specimen Description BLOOD HAND LEFT  Final   Special Requests BOTTLES DRAWN AEROBIC ONLY 10CC  Final   Culture   Final           BLOOD CULTURE RECEIVED NO GROWTH TO DATE CULTURE WILL BE HELD FOR 5 DAYS BEFORE ISSUING A FINAL NEGATIVE REPORT Performed at [facility]    Report Status PENDING  Incomplete    Anti-infectives    Start     Dose/Rate Route Frequency Ordered Stop   09/02/14  0800  vancomycin (VANCOCIN) IVPB 1000 mg/200 mL premix     1,000 mg200 mL/hr over 60 Minutes Intravenous Every 8 hours 09/02/14 0036     09/02/14 0045  vancomycin (VANCOCIN) IVPB 1000 mg/200 mL premix     1,000 mg200 mL/hr over 60 Minutes Intravenous  Once 09/02/14 0036     08/31/14 1600  vancomycin (VANCOCIN) IVPB 750 mg/150 ml premix  Status:  Discontinued     750 mg150 mL/hr over 60 Minutes Intravenous Every 8 hours 08/31/14 0755 09/02/14 0035   08/31/14 1400  aztreonam (AZACTAM) 2 g in dextrose 5 % 50 mL IVPB     2 g100 mL/hr over 30 Minutes Intravenous 3 times per day 08/31/14 0744 09/08/14 1359   08/31/14 0800  vancomycin (VANCOCIN) 1,500 mg in sodium chloride 0.9 % 500 mL IVPB     1,500 mg250 mL/hr over 120 Minutes Intravenous  Once 08/31/14 0746 08/31/14 1102   08/31/14 0645  vancomycin (VANCOCIN) IVPB 1000 mg/200 mL premix  Status:  Discontinued     1,000 mg200 mL/hr over 60 Minutes Intravenous  Once 08/31/14 0640 08/31/14 0744   08/31/14 0645  ceFEPIme (MAXIPIME) 2 g in dextrose 5 % 50 mL IVPB     2 g100 mL/hr over 30 Minutes Intravenous  Once  08/31/14 0640 08/31/14 0730      Assessment: 59yo male slightly subtherapeutic on vancomycin with initial dosing for HCAO vs aspiration PNA; last dose given late so true trough a little lower than the reported 14.7.  Goal of Therapy:  Vancomycin trough level 15-20 mcg/ml  Plan:  Will increase vancomycin to 1000mg  IV Q8H for calculated trough closer to 17 and continue to monitor.  Wynona Neat, PharmD, BCPS  09/02/2014,12:36 AM

## 2014-09-02 NOTE — Progress Notes (Signed)
Physical Therapy Treatment Patient Details Name: Samuel Reyes MRN: [id number] DOB: [birth year] Today's Date: 09/02/2014    History of Present Illness Avon Mergenthaler  is a 59 y.o. male, with a past medical history of alcoholism, suicide attempt, gastrectomy for peptic ulcer disease, TIA, COPD.  He presents to the ER with 5-6 days of vomiting and epigastric pain.  He reports subjective fevers and feeling weak. He has been taking goodies powders to relieve his abdominal pain.   He denies blood in his emesis and stool.  He denies diarrhea. He drinks 24 - 40 beers daily but at this time denies hard alcohol and recreational drugs    PT Comments    Emphasis on gait training with standard cane.  Pt had trouble sequencing, but was more steady and felt more confident with the cane.   Follow Up Recommendations  Supervision/Assistance - 24 hour     Equipment Recommendations  Cane    Recommendations for Other Services       Precautions / Restrictions Precautions Precautions: Fall    Mobility  Bed Mobility Overal bed mobility: Modified Independent                Transfers Overall transfer level: Needs assistance Equipment used: Straight cane Transfers: Sit to/from Stand Sit to Stand: Supervision         General transfer comment: more stability with cane in hand  Ambulation/Gait Ambulation/Gait assistance: Supervision Ambulation Distance (Feet): 400 Feet   Gait Pattern/deviations: Step-through pattern Gait velocity: slower   General Gait Details: still mildly unsteady at times.  Having trouble sequencing with the cane, but is still more stable than without the device.   Stairs Stairs: Yes Stairs assistance: Supervision Stair Management: One rail Left;With cane;Alternating pattern;Forwards Number of Stairs: 4 General stair comments: generally safe  Wheelchair Mobility    Modified Rankin (Stroke Patients Only)       Balance Overall balance assessment: No  apparent balance deficits (not formally assessed) Sitting-balance support: No upper extremity supported Sitting balance-Leahy Scale: Fair       Standing balance-Leahy Scale: Fair                      Cognition Arousal/Alertness: Awake/alert Behavior During Therapy: WFL for tasks assessed/performed Overall Cognitive Status: Within Functional Limits for tasks assessed                      Exercises      General Comments        Pertinent Vitals/Pain Pain Assessment: No/denies pain    Home Living                      Prior Function            PT Goals (current goals can now be found in the care plan section) Acute Rehab PT Goals PT Goal Formulation: With patient Time For Goal Achievement: 09/08/14 Potential to Achieve Goals: Good Progress towards PT goals: Progressing toward goals    Frequency  Min 3X/week    PT Plan Current plan remains appropriate    Co-evaluation             End of Session   Activity Tolerance: Patient tolerated treatment well Patient left: in bed;with call bell/phone within reach     Time: 1400-1417 PT Time Calculation (min) (ACUTE ONLY): 17 min  Charges:  $Gait Training: 8-22 mins  G Codes:      Mykell Genao, Tessie Fass 09/02/2014, 3:12 PM 09/02/2014  Donnella Sham, PT [phone]  (pager)

## 2014-09-03 LAB — CBC WITH DIFFERENTIAL/PLATELET
Basophils Absolute: 0 10*3/uL (ref 0.0–0.1)
Basophils Relative: 0 % (ref 0–1)
Eosinophils Absolute: 0 10*3/uL (ref 0.0–0.7)
Eosinophils Relative: 0 % (ref 0–5)
HEMATOCRIT: 33.3 % — AB (ref 39.0–52.0)
Hemoglobin: 10.8 g/dL — ABNORMAL LOW (ref 13.0–17.0)
Lymphocytes Relative: 17 % (ref 12–46)
Lymphs Abs: 1.6 10*3/uL (ref 0.7–4.0)
MCH: 28.6 pg (ref 26.0–34.0)
MCHC: 32.4 g/dL (ref 30.0–36.0)
MCV: 88.1 fL (ref 78.0–100.0)
MONOS PCT: 8 % (ref 3–12)
Monocytes Absolute: 0.7 10*3/uL (ref 0.1–1.0)
Neutro Abs: 7.4 10*3/uL (ref 1.7–7.7)
Neutrophils Relative %: 75 % (ref 43–77)
PLATELETS: 326 10*3/uL (ref 150–400)
RBC: 3.78 MIL/uL — ABNORMAL LOW (ref 4.22–5.81)
RDW: 15 % (ref 11.5–15.5)
WBC: 9.7 10*3/uL (ref 4.0–10.5)

## 2014-09-03 LAB — BASIC METABOLIC PANEL
ANION GAP: 7 (ref 5–15)
BUN: <5 mg/dL — ABNORMAL LOW (ref 6–23)
CHLORIDE: 104 mmol/L (ref 96–112)
CO2: 26 mmol/L (ref 19–32)
Calcium: 8.4 mg/dL (ref 8.4–10.5)
Creatinine, Ser: 0.75 mg/dL (ref 0.50–1.35)
Glucose, Bld: 98 mg/dL (ref 70–99)
Potassium: 4.5 mmol/L (ref 3.5–5.1)
SODIUM: 137 mmol/L (ref 135–145)

## 2014-09-03 LAB — MAGNESIUM: MAGNESIUM: 2 mg/dL (ref 1.5–2.5)

## 2014-09-03 MED ORDER — HYDRALAZINE HCL 20 MG/ML IJ SOLN
10.0000 mg | Freq: Four times a day (QID) | INTRAMUSCULAR | Status: DC | PRN
  Filled 2014-09-03: qty 1

## 2014-09-03 NOTE — Progress Notes (Addendum)
[facility] PROGRESS NOTE   Samuel Reyes IOM:[id number] DOB: 1956-05-04 DOA: 08/31/2014 PCP: Lorayne Marek, MD  HPI: Samuel Reyes is a 59 y.o. male, with a past medical history of alcoholism, suicide attempt, gastrectomy for peptic ulcer disease, TIA, COPD. He presents to the ER with 5-6 days of vomiting and epigastric pain. He reports subjective fevers and feeling weak. He has been taking goodies powders to relieve his abdominal pain. He denies blood in his emesis and stool. He denies diarrhea. He drinks 24 - 40 beers daily but at this time denies hard alcohol and recreational drugs.  Samuel Reyes has had multiple [facility] admissions after the death of his two sons and wife. Most recently he was admitted from 1/3 - 08/09/14. He regularly attends AA, but appears to have been unsuccessful in his attempts to combat his severe depression and alcoholism.  Subjective: Last night, patient reports an episode of waking up gasping and short of breath. The patient is alert today and feels better than yesterday. He reports headache, sore throat, some hand tremor this morning. Otherwise stable. He is on CIWA protocol, this morning score 3. He is still worried about someone maybe stealing all his money out of the bank. He has not called [facility] yet regarding alcoholism rehab.  Assessment/Plan: Principal Problem:   HCAP (healthcare-associated pneumonia) Active Problems:   Alcohol dependence   Major depression, recurrent   Persistent vomiting   Hypokalemia   Hyponatremia   HCAP vs Asp PNA This may have been aspiration pna from vomiting as well. Blood cultures have been obtained. Results pending. Discontinue Aztreonam.  Change to oral antibiotics (Levofloxacin). PNA order set:  HIV screen non-reactive, Urine negative for Legionella and Strep pneumo, Awaiting sputum culture. Supportive management,, Duonebs, mucolytics.   Alcohol dependence He has a history of withdrawal; he reports  his last drink was Monday, and therefore has been placed on scheduled ativan in addition to CIWA protocol.  Also Ativan PRN. Patient says he has never been in rehab but is interested in [place].  Consult social work.  Persistent vomiting Xray shows gaseous distention of his bowel loops. Given K of 2.5 at admission. Likely NSAIDs versus alcoholic gastritis, patient uses Goody powder every day. Today K is 4.5. He is not vomiting today. Has been upgraded to Dysphagia 3 diet. Change to oral Protonix. Abdominal pain is better.  Hypokalemia Likely secondary to GI loss as well as kaliuresis from alcohol. Potassium 4.5 this morning. Magnesium corrected to 2.0. Recheck in a.m.  Severe depression Continue home meds (trazodone, Prozac).   Hx of TIA 81 mg aspirin daily.  HTN Continue metoprolol.  Code Status: DNR Family Communication: Plan discussed with the patient. Disposition Plan: Remains inpatient Diet: DIET DYS 3  Consultants:  None  Procedures:  None  Antibiotics:  Aztreonam 2/2>>  Vancomycin 2/2-2/4  Cefepime on 2/2 only   Objective: Filed Vitals:   09/03/14 1007  BP: 124/53  Pulse: 71  Temp:   Resp:     Intake/Output Summary (Last 24 hours) at 09/03/14 1221 Last data filed at 09/03/14 1110  Gross per 24 hour  Intake 3680.75 ml  Output   2125 ml  Net 1555.75 ml   Filed Weights   08/31/14 0219  Weight: 72.576 kg (160 lb)    Exam: General: Small stature, male, alert and awake, AOx3, not in any acute distress, conversational.  HEENT: anicteric sclera, no eye/ear/nose drainage.  CVS: S1-S2 clear, no murmur rubs or gallops, pedal pulses 3+ Chest: Rhonchi in  lower lobes, more prominent on right side.  Abdomen: soft, nondistended, tender to palpation in epigastric area.  Extremities: no cyanosis, clubbing or edema noted bilaterally, 5/5 strength lower extremities   Data Reviewed: Basic Metabolic Panel:  Recent Labs Lab 08/31/14 0430  08/31/14 1510 09/01/14 0725 09/02/14 0720 09/03/14 0646  NA 125* 133* 134* 135 137  K 2.5* 3.3* 3.4* 4.3 4.5  CL 86* 100 103 105 104  CO2 30 29 27 27 26   GLUCOSE 98 119* 111* 94 98  BUN 6 5* <5* <5* <5*  CREATININE 0.67 0.74 0.79 0.76 0.75  CALCIUM 7.3* 7.2* 7.6* 8.0* 8.4  MG 1.8  --   --  1.8 2.0   Liver Function Tests:  Recent Labs Lab 08/31/14 0430 09/01/14 0725  AST 16 13  ALT 11 8  ALKPHOS 71 58  BILITOT 0.8 0.4  PROT 5.6* 4.8*  ALBUMIN 3.0* 2.4*    Recent Labs Lab 08/31/14 0430  LIPASE 22   No results for input(s): AMMONIA in the last 168 hours. CBC:  Recent Labs Lab 08/31/14 0430 09/01/14 0725 09/02/14 0720 09/03/14 0646  WBC 12.8* 8.5 12.0* 9.7  NEUTROABS 10.3*  --   --  7.4  HGB 12.5* 10.8* 11.2* 10.8*  HCT 36.0* 32.6* 35.0* 33.3*  MCV 83.1 87.4 88.8 88.1  PLT 336 303 328 326   Cardiac Enzymes:  Recent Labs Lab 08/31/14 0430  TROPONINI 0.03   BNP (last 3 results) No results for input(s): BNP in the last 8760 hours.  ProBNP (last 3 results)  Recent Labs  04/14/14 1911  PROBNP 49.4    CBG: No results for input(s): GLUCAP in the last 168 hours.  Micro Recent Results (from the past 240 hour(s))  Blood culture (routine x 2)     Status: None (Preliminary result)   Collection Time: 08/31/14  6:40 AM  Result Value Ref Range Status   Specimen Description BLOOD ARM LEFT  Final   Special Requests BOTTLES DRAWN AEROBIC AND ANAEROBIC 10CC  Final   Culture   Final           BLOOD CULTURE RECEIVED NO GROWTH TO DATE CULTURE WILL BE HELD FOR 5 DAYS BEFORE ISSUING A FINAL NEGATIVE REPORT Performed at [facility]    Report Status PENDING  Incomplete  Blood culture (routine x 2)     Status: None (Preliminary result)   Collection Time: 08/31/14  6:45 AM  Result Value Ref Range Status   Specimen Description BLOOD HAND LEFT  Final   Special Requests BOTTLES DRAWN AEROBIC ONLY 10CC  Final   Culture   Final           BLOOD CULTURE  RECEIVED NO GROWTH TO DATE CULTURE WILL BE HELD FOR 5 DAYS BEFORE ISSUING A FINAL NEGATIVE REPORT Performed at [facility]    Report Status PENDING  Incomplete     Studies: No results found.  Scheduled Meds: . ARIPiprazole  5 mg Oral Daily  . aspirin  81 mg Oral Daily  . aztreonam  2 g Intravenous 3 times per day  . enoxaparin (LOVENOX) injection  40 mg Subcutaneous Q24H  . FLUoxetine  80 mg Oral Daily  . folic acid  1 mg Oral Daily  . gabapentin  300 mg Oral BID  . guaiFENesin  1,200 mg Oral BID  . ipratropium-albuterol  3 mL Inhalation BID  . LORazepam  1 mg Intravenous TID  . LORazepam  0-4 mg Oral Q12H  .  metoprolol tartrate  25 mg Oral BID  . multivitamin with minerals  1 tablet Oral Daily  . nicotine  21 mg Transdermal Daily  . pantoprazole  40 mg Oral Daily  . sodium chloride  3 mL Intravenous Q12H  . thiamine  100 mg Oral Daily   Or  . thiamine  100 mg Intravenous Daily  . trazodone  300 mg Oral QHS   Continuous Infusions: . 0.9 % NaCl with KCl 40 mEq / L 125 mL/hr (09/03/14 0458)       Time spent: 35 minutes    Magda Bernheim, PA-S  [facility] Pager [phone] If 7PM-7AM, please contact night-coverage at [email or web address], password [facility] 09/03/2014, 12:21 PM  LOS: 3 days       Addendum  Patient seen and examined, chart and data base reviewed.  I agree with the above assessment and plan.  For full details please see Mrs. Magda Bernheim, PA-S note.  59 year old alcoholic male who came in with cough and generalized weakness, turned out to have pneumonia and hypokalemia. Cannot rule out alcoholic/NSAID gastritis secondary to abdominal pain.  Exam General: Alert and awake, oriented x3, not in any acute distress. HEENT: anicteric sclera, pupils reactive to light and accommodation, EOMI CVS: S1-S2 clear, no murmur rubs or gallops Chest: clear to auscultation bilaterally, no wheezing, rales or rhonchi Abdomen: soft nontender,  nondistended, normal bowel sounds, no organomegaly Extremities: no cyanosis, clubbing or edema noted bilaterally Neuro: Cranial nerves II-XII intact, no focal neurological deficits   Plan: Continue CIWA protocol, continue current antibiotics for pneumonia, check CMP in a.m.   Birdie Hopes, MD [facility] Pager: [phone] 09/03/2014, 12:21 PM

## 2014-09-03 NOTE — Progress Notes (Signed)
MD notified due to acute change in patient status. Vital signs and labs are listed below.  MD notified(1st page) Time of 1st page:  0039 Responding MD:  Dr. Rogue Bussing Time MD responded: 041 MD response: No new orders giving, will continue to monitor.   Vital Signs Filed Vitals:   09/02/14 1200 09/02/14 1358 09/02/14 2042 09/02/14 2233  BP: 152/84 154/77  148/77  Pulse: 82   102  Temp:  98.1 F (36.7 C)    TempSrc:  Oral    Resp:  16  18  Height:      Weight:      SpO2:  98% 98% 97%     Lab Results WBC  Date/Time Value Ref Range Status  09/02/2014 07:20 AM 12.0* 4.0 - 10.5 K/uL Final  09/01/2014 07:25 AM 8.5 4.0 - 10.5 K/uL Final  08/31/2014 04:30 AM 12.8* 4.0 - 10.5 K/uL Final   NEUTROPHILS RELATIVE %  Date/Time Value Ref Range Status  08/31/2014 04:30 AM 80* 43 - 77 % Final  08/01/2014 09:40 AM 77 43 - 77 % Final  07/21/2014 06:56 AM 74 43 - 77 % Final   No results found for: PCO2ART No results found for: LATICACIDVEN No results found for: Cyndy Freeze, RN 09/03/2014, 12:42 AM

## 2014-09-03 NOTE — Clinical Social Work Note (Signed)
Clinical Social Work Department BRIEF PSYCHOSOCIAL ASSESSMENT 09/02/2014  Patient:  Samuel Reyes, Samuel Reyes     Account Number:  [id number]     Admit date:  08/31/2014  Clinical Social Worker:  Myles Lipps  Date/Time:  09/02/2014 04:00 PM  Referred by:  Physician  Date Referred:  09/02/2014 Referred for  Substance Abuse   Other Referral:   Interview type:  Patient Other interview type:   No family/friends at bedside at time of the assessment    PSYCHOSOCIAL DATA Living Status:  FRIEND(S) Admitted from facility:   Level of care:   Primary support name:  Alona Bene  [phone] Primary support relationship to patient:  PARENT Degree of support available:   Adequate    CURRENT CONCERNS Current Concerns  Substance Abuse   Other Concerns:    SOCIAL WORK ASSESSMENT / PLAN Clinical Social Worker met with patient at bedside to offer support and discuss patient current substance use habits. Patient states that he was living at home with his mother and stepfather, but just recently moved in to an apartment with a friend and her husband.  Patient states that the living arrangement was working well for them and he is able to return if able at discharge.    Patient states that he drinks about 24 beers a day. Patient feels that due to his lack of employment and Disability denials he is drinking alcohol out of boredom. Patient knows that he will be unable to cease complete use on his own without assistance.  Patient states that he has completed assessment paperwork with [facility], however his Medicaid application was completed in [facility] and had to be switched to [place] - patient has had this process completed.  Patient is willing to return home with outpatient resources but will need transportation to [facility] once accepted.  SBIRT complete.  Patient requests resources at time of discharge.  CSW remains available for support and to assist with possible [facility] placement and  transportation needs.   Assessment/plan status:  Psychosocial Support/Ongoing Assessment of Needs Other assessment/ plan:   Information/referral to community resources:   Clinical Social Worker offered patient with outpatient resources, in which he requested at time of discharge.  CSW also left patient with [facility] contact phone number to reach out and determine where he is on the list for admission.    PATIENT'S/FAMILY'S RESPONSE TO PLAN OF CARE: Patient alert and oriented x3 sitting up in bed.  Patient very engaged in conversation and is making every effort to make some large life changes.  Patient with appropriate family support, but seems to have a more supportive environment with friends.  Patient understanding that he may have to return home and will likely not discharge from the hospital to [facility].  CSW empowered patient to continue to research his status at [facility].  Patient verbalized understanding of CSW role and appreciation for support and involvement.

## 2014-09-04 MED ORDER — DIPHENHYDRAMINE HCL 25 MG PO CAPS
25.0000 mg | ORAL_CAPSULE | Freq: Once | ORAL | Status: AC
  Administered 2014-09-04: 25 mg via ORAL
  Filled 2014-09-04: qty 1

## 2014-09-04 MED ORDER — AMOXICILLIN-POT CLAVULANATE 875-125 MG PO TABS
1.0000 | ORAL_TABLET | Freq: Two times a day (BID) | ORAL | Status: DC
  Administered 2014-09-04 – 2014-09-05 (×3): 1 via ORAL
  Filled 2014-09-04 (×4): qty 1

## 2014-09-04 NOTE — Progress Notes (Signed)
[facility] PROGRESS NOTE   Quantrell Splitt KNL:[id number] DOB: Aug 14, 1955 DOA: 08/31/2014 PCP: Lorayne Marek, MD  HPI: Samuel Reyes is a 59 y.o. male, with a past medical history of alcoholism, suicide attempt, gastrectomy for peptic ulcer disease, TIA, COPD. He presents to the ER with 5-6 days of vomiting and epigastric pain. He reports subjective fevers and feeling weak. He has been taking goodies powders to relieve his abdominal pain. He denies blood in his emesis and stool. He denies diarrhea. He drinks 24 - 40 beers daily but at this time denies hard alcohol and recreational drugs.  [name] has had multiple [facility] admissions after the death of his two sons and wife. Most recently he was admitted from 1/3 - 08/09/14. He regularly attends AA, but appears to have been unsuccessful in his attempts to combat his severe depression and alcoholism.  Subjective: Feeling much better, less shaking and headache. Has more appetite, appears to be more steady on his feet but he still needs cane.  Assessment/Plan: Principal Problem:   HCAP (healthcare-associated pneumonia) Active Problems:   Alcohol dependence   Major depression, recurrent   Persistent vomiting   Hypokalemia   Hyponatremia   HCAP vs Asp PNA This may have been aspiration pna from vomiting as well. Blood cultures NGTD PNA order set:  HIV screen non-reactive, Urine negative for Legionella and Strep pneumo, Awaiting sputum culture. Supportive management,, Duonebs, mucolytics.  Antibiotics switched to oral Augmentin, continue for now. Reportedly has rash to penicillins, will watch today, likely can be discharged on Augmentin tomorrow.  Alcohol withdrawal Patient drinks about 24 cans per day, last drink on Monday, February 1. Had previous episodes of eye call was controlled and DTs. Started on CIWA protocol, improved, last drink 5 days ago, if he did okay he will be probably discharge in a.m.  Alcohol  dependence Patient drinks about 24 cans of beer per day. Last drink on Monday February 1. Patient says he has never been in rehab but is interested in [place]. CSW saw him.  Vomiting/abdominal pain Likely NSAIDs versus alcoholic gastritis, patient uses Goody powder every day. X-rays showed questionable ileus, this is likely secondary to hypokalemia. Now on oral Protonix.  Hypokalemia Likely secondary to GI loss as well as kaliuresis from alcohol. Potassium and magnesium repleted, recheck potassium  Severe depression Continue home meds (trazodone, Prozac).   Hx of TIA 81 mg aspirin daily.  HTN Continue metoprolol.   Code Status: DNR Family Communication: Plan discussed with the patient. Disposition Plan: Remains inpatient Diet: DIET DYS 3  Consultants:  None  Procedures:  None  Antibiotics:  Aztreonam 2/2>>  Vancomycin 2/2-2/4  Cefepime on 2/2 only   Objective: Filed Vitals:   09/04/14 1223  BP: 116/83  Pulse: 85  Temp: 98 F (36.7 C)  Resp: 16    Intake/Output Summary (Last 24 hours) at 09/04/14 1334 Last data filed at 09/04/14 0745  Gross per 24 hour  Intake 2506.17 ml  Output   1825 ml  Net 681.17 ml   Filed Weights   08/31/14 0219  Weight: 72.576 kg (160 lb)    Exam: General: Small stature, male, alert and awake, AOx3, not in any acute distress, conversational.  HEENT: anicteric sclera, no eye/ear/nose drainage.  CVS: S1-S2 clear, no murmur rubs or gallops, pedal pulses 3+ Chest: Rhonchi in lower lobes, more prominent on right side.  Abdomen: soft, nondistended, tender to palpation in epigastric area.  Extremities: no cyanosis, clubbing or edema noted bilaterally, 5/5 strength lower  extremities   Data Reviewed: Basic Metabolic Panel:  Recent Labs Lab 08/31/14 0430 08/31/14 1510 09/01/14 0725 09/02/14 0720 09/03/14 0646  NA 125* 133* 134* 135 137  K 2.5* 3.3* 3.4* 4.3 4.5  CL 86* 100 103 105 104  CO2 30 29 27 27 26   GLUCOSE  98 119* 111* 94 98  BUN 6 5* <5* <5* <5*  CREATININE 0.67 0.74 0.79 0.76 0.75  CALCIUM 7.3* 7.2* 7.6* 8.0* 8.4  MG 1.8  --   --  1.8 2.0   Liver Function Tests:  Recent Labs Lab 08/31/14 0430 09/01/14 0725  AST 16 13  ALT 11 8  ALKPHOS 71 58  BILITOT 0.8 0.4  PROT 5.6* 4.8*  ALBUMIN 3.0* 2.4*    Recent Labs Lab 08/31/14 0430  LIPASE 22   No results for input(s): AMMONIA in the last 168 hours. CBC:  Recent Labs Lab 08/31/14 0430 09/01/14 0725 09/02/14 0720 09/03/14 0646  WBC 12.8* 8.5 12.0* 9.7  NEUTROABS 10.3*  --   --  7.4  HGB 12.5* 10.8* 11.2* 10.8*  HCT 36.0* 32.6* 35.0* 33.3*  MCV 83.1 87.4 88.8 88.1  PLT 336 303 328 326   Cardiac Enzymes:  Recent Labs Lab 08/31/14 0430  TROPONINI 0.03   BNP (last 3 results) No results for input(s): BNP in the last 8760 hours.  ProBNP (last 3 results)  Recent Labs  04/14/14 1911  PROBNP 49.4    CBG: No results for input(s): GLUCAP in the last 168 hours.  Micro Recent Results (from the past 240 hour(s))  Blood culture (routine x 2)     Status: None (Preliminary result)   Collection Time: 08/31/14  6:40 AM  Result Value Ref Range Status   Specimen Description BLOOD ARM LEFT  Final   Special Requests BOTTLES DRAWN AEROBIC AND ANAEROBIC 10CC  Final   Culture   Final           BLOOD CULTURE RECEIVED NO GROWTH TO DATE CULTURE WILL BE HELD FOR 5 DAYS BEFORE ISSUING A FINAL NEGATIVE REPORT Performed at [facility]    Report Status PENDING  Incomplete  Blood culture (routine x 2)     Status: None (Preliminary result)   Collection Time: 08/31/14  6:45 AM  Result Value Ref Range Status   Specimen Description BLOOD HAND LEFT  Final   Special Requests BOTTLES DRAWN AEROBIC ONLY 10CC  Final   Culture   Final           BLOOD CULTURE RECEIVED NO GROWTH TO DATE CULTURE WILL BE HELD FOR 5 DAYS BEFORE ISSUING A FINAL NEGATIVE REPORT Performed at [facility]    Report Status PENDING  Incomplete       Studies: No results found.  Scheduled Meds: . ARIPiprazole  5 mg Oral Daily  . aspirin  81 mg Oral Daily  . aztreonam  2 g Intravenous 3 times per day  . enoxaparin (LOVENOX) injection  40 mg Subcutaneous Q24H  . FLUoxetine  80 mg Oral Daily  . folic acid  1 mg Oral Daily  . gabapentin  300 mg Oral BID  . guaiFENesin  1,200 mg Oral BID  . ipratropium-albuterol  3 mL Inhalation BID  . LORazepam  1 mg Intravenous TID  . LORazepam  0-4 mg Oral Q12H  . metoprolol tartrate  25 mg Oral BID  . multivitamin with minerals  1 tablet Oral Daily  . nicotine  21 mg Transdermal Daily  . pantoprazole  40 mg Oral Daily  . sodium chloride  3 mL Intravenous Q12H  . thiamine  100 mg Oral Daily   Or  . thiamine  100 mg Intravenous Daily  . trazodone  300 mg Oral QHS   Continuous Infusions: . 0.9 % NaCl with KCl 40 mEq / L 125 mL/hr (09/04/14 1303)       Time spent: 35 minutes    [facility]  [facility] Pager [phone] If 7PM-7AM, please contact night-coverage at [email or web address], password [facility] 09/04/2014, 1:34 PM  LOS: 4 days

## 2014-09-05 LAB — CBC WITH DIFFERENTIAL/PLATELET
BASOS PCT: 0 % (ref 0–1)
Basophils Absolute: 0 10*3/uL (ref 0.0–0.1)
EOS ABS: 0 10*3/uL (ref 0.0–0.7)
Eosinophils Relative: 0 % (ref 0–5)
HCT: 37 % — ABNORMAL LOW (ref 39.0–52.0)
Hemoglobin: 11.8 g/dL — ABNORMAL LOW (ref 13.0–17.0)
LYMPHS ABS: 1.3 10*3/uL (ref 0.7–4.0)
LYMPHS PCT: 15 % (ref 12–46)
MCH: 28.7 pg (ref 26.0–34.0)
MCHC: 31.9 g/dL (ref 30.0–36.0)
MCV: 90 fL (ref 78.0–100.0)
MONO ABS: 1 10*3/uL (ref 0.1–1.0)
MONOS PCT: 12 % (ref 3–12)
Neutro Abs: 6.1 10*3/uL (ref 1.7–7.7)
Neutrophils Relative %: 73 % (ref 43–77)
Platelets: 344 10*3/uL (ref 150–400)
RBC: 4.11 MIL/uL — ABNORMAL LOW (ref 4.22–5.81)
RDW: 15.3 % (ref 11.5–15.5)
WBC: 8.4 10*3/uL (ref 4.0–10.5)

## 2014-09-05 LAB — BASIC METABOLIC PANEL
ANION GAP: 7 (ref 5–15)
BUN: 11 mg/dL (ref 6–23)
CALCIUM: 8.9 mg/dL (ref 8.4–10.5)
CO2: 26 mmol/L (ref 19–32)
CREATININE: 0.8 mg/dL (ref 0.50–1.35)
Chloride: 101 mmol/L (ref 96–112)
GFR calc non Af Amer: >90 mL/min (ref >90)
GLUCOSE: 130 mg/dL — AB (ref 70–99)
POTASSIUM: 4.2 mmol/L (ref 3.5–5.1)
Sodium: 134 mmol/L — ABNORMAL LOW (ref 135–145)

## 2014-09-05 MED ORDER — PANTOPRAZOLE SODIUM 40 MG PO TBEC: 40.0000 mg | DELAYED_RELEASE_TABLET | Freq: Every day | ORAL | Status: DC

## 2014-09-05 MED ORDER — ASPIRIN 81 MG PO CHEW: 81.0000 mg | CHEWABLE_TABLET | Freq: Every day | ORAL | Status: DC

## 2014-09-05 MED ORDER — AMOXICILLIN-POT CLAVULANATE 875-125 MG PO TABS: 1.0000 | ORAL_TABLET | Freq: Two times a day (BID) | ORAL | Status: DC

## 2014-09-05 NOTE — Discharge Summary (Signed)
Physician Discharge Summary  Alfonza Toft YQM:[id number] DOB: [birth year] DOA: 08/31/2014  PCP: Lorayne Marek, MD  Admit date: 08/31/2014 Discharge date: 09/05/2014  Time spent: 40 minutes  Recommendations for Outpatient Follow-up:  1. Follow-up with primary care physician within one week. 2. Follow-up with [facility] rehabilitation tomorrow.  Discharge Diagnoses:  Principal Problem:   HCAP (healthcare-associated pneumonia) Active Problems:   Alcohol dependence   Major depression, recurrent   Persistent vomiting   Hypokalemia   Hyponatremia   Discharge Condition: Stable  Diet recommendation: Heart healthy  Filed Weights   08/31/14 0219  Weight: 72.576 kg (160 lb)    History of present illness:  Derris Millan is a 59 y.o. male, with a past medical history of alcoholism, suicide attempt, gastrectomy for peptic ulcer disease, TIA, COPD. He presents to the ER with 5-6 days of vomiting and epigastric pain. He reports subjective fevers and feeling weak. He has been taking goodies powders to relieve his abdominal pain. He denies blood in his emesis and stool. He denies diarrhea. He drinks 24 - 40 beers daily but at this time denies hard alcohol and recreational drugs.  Mr. Lantigua has had multiple [facility] admissions after the death of his two sons and wife. Most recently he was admitted from 1/3 - 08/09/14. He regularly attends AA, but appears to have been unsuccessful in his attempts to combat his severe depression and alcoholism.  Hospital Course:    HCAP vs Asp PNA This may have been aspiration pna from vomiting as well. Blood cultures NGTD PNA order set: HIV screen non-reactive, Urine negative for Legionella and Strep pneumo. Patient did wear a well initially was on aztreonam which switched to Augmentin. Patient did very well without any reaction to Augmentin (reportedly has allergies to penicillins). Continue Augmentin for 3 more days, patient is back to  baseline.  Alcohol withdrawal Patient drinks about 24 cans per day, last drink on Monday, February 1. Had previous episodes of alcohol withdrawal and DTs Wasn't CIWA protocol, improved no shakes or anxiety. Today he is 7 days since last drink, no use of visit as being since yesterday discharged home.  Alcohol dependence Patient drinks about 24 cans of beer per day. Last drink on Monday February 1. Seen by Education officer, museum, patient is interested in seeing a local rehabilitation facility [facility]). Patient called and the rehabilitation facility asked him to bring his insurance card with proof of residence at [facility]. Patient will follow-up with the rehabilitation facility tomorrow as outpatient.  Vomiting/abdominal pain Likely NSAIDs versus alcoholic gastritis, patient uses Goody powder and Voltaren every day. X-rays showed questionable ileus, this is likely secondary to hypokalemia. This is resolved, abdominal pain and nausea and vomiting. Patient started on Protonix, discharged on 40 mg of Protonix daily.  Hypokalemia Likely secondary to GI loss as well as kaliuresis from alcohol. Potassium and magnesium repleted with oral and parenteral supplements, normal potassium and magnesium on discharge.  Severe depression Continue home meds (trazodone, Prozac).   Hx of TIA 81 mg aspirin daily.  HTN Continue metoprolol.   Procedures:  None  Consultations:  None  Discharge Exam: Filed Vitals:   09/05/14 0800  BP: 120/67  Pulse: 82  Temp:   Resp:    General: Alert and awake, oriented x3, not in any acute distress. HEENT: anicteric sclera, pupils reactive to light and accommodation, EOMI CVS: S1-S2 clear, no murmur rubs or gallops Chest: clear to auscultation bilaterally, no wheezing, rales or rhonchi Abdomen: soft nontender, nondistended, normal bowel sounds,  no organomegaly Extremities: no cyanosis, clubbing or edema noted bilaterally Neuro: Cranial nerves II-XII  intact, no focal neurological deficits  Discharge Instructions   Discharge Instructions    Diet - low sodium heart healthy    Complete by:  As directed      Increase activity slowly    Complete by:  As directed           Current Discharge Medication List    START taking these medications   Details  amoxicillin-clavulanate (AUGMENTIN) 875-125 MG per tablet Take 1 tablet by mouth every 12 (twelve) hours. Qty: 6 tablet, Refills: 0    aspirin 81 MG chewable tablet Chew 1 tablet (81 mg total) by mouth daily.    pantoprazole (PROTONIX) 40 MG tablet Take 1 tablet (40 mg total) by mouth daily. Qty: 30 tablet, Refills: 0      CONTINUE these medications which have NOT CHANGED   Details  acamprosate (CAMPRAL) 333 MG tablet Take 2 tablets (666 mg total) by mouth 3 (three) times daily with meals. For alcoholism Qty: 60 tablet, Refills: 0    albuterol (PROVENTIL HFA;VENTOLIN HFA) 108 (90 BASE) MCG/ACT inhaler Inhale 2 puffs into the lungs every 6 (six) hours as needed for wheezing or shortness of breath. Qty: 1 Inhaler, Refills: 5    albuterol-ipratropium (COMBIVENT) 18-103 MCG/ACT inhaler Inhale 2 puffs into the lungs 2 (two) times daily. For shortness of breath Qty: 1 Inhaler, Refills: 5    ARIPiprazole (ABILIFY) 5 MG tablet Take 5 mg by mouth daily.    FLUoxetine (PROZAC) 40 MG capsule Take 2 capsules (80 mg total) by mouth daily. For depression Qty: 60 capsule, Refills: 0    gabapentin (NEURONTIN) 300 MG capsule Take 1 capsule (300 mg total) by mouth 2 (two) times daily. For substance withdrawal syndrome Qty: 60 capsule, Refills: 0    metoprolol tartrate (LOPRESSOR) 25 MG tablet Take 1 tablet (25 mg total) by mouth 2 (two) times daily. For high blood pressure Qty: 60 tablet, Refills: 3   Associated Diagnoses: HTN (hypertension)    nicotine (NICODERM CQ - DOSED IN MG/24 HOURS) 21 mg/24hr patch Place 21 mg onto the skin daily.    trazodone (DESYREL) 150 MG tablet Take 2 tablets  (300 mg total) by mouth at bedtime. For sleep Qty: 60 tablet, Refills: 0      STOP taking these medications     famotidine (PEPCID) 20 MG tablet      naproxen (NAPROSYN) 500 MG tablet        Allergies  Allergen Reactions  . Bee Venom Anaphylaxis  . Penicillins Rash      The results of significant diagnostics from this hospitalization (including imaging, microbiology, ancillary and laboratory) are listed below for reference.    Significant Diagnostic Studies: Dg Abd 2 Views  09/01/2014   CLINICAL DATA:  Vomiting for 5 days, epigastric pain  EXAM: ABDOMEN - 2 VIEW  COMPARISON:  08/31/2014  FINDINGS: No free abdominal air is noted. Residual mild gaseous distended small bowel loops with slight improvement from prior exam. Again findings suspicious for enteritis or mild ileus. No significant air-fluid levels.  IMPRESSION: Residual mild gaseous distended small bowel loops with slight improvement from prior exam suspicious for enteritis or ileus.   Electronically Signed   By: Lahoma Crocker M.D.   On: 09/01/2014 08:15   Dg Abd Acute W/chest  08/31/2014   CLINICAL DATA:  Abdominal pain, nausea, vomiting, diarrhea. Symptoms for 5 days.  EXAM: ACUTE ABDOMEN SERIES (ABDOMEN  2 VIEW & CHEST 1 VIEW)  COMPARISON:  Chest radiographs 07/20/2014  FINDINGS: The cardiomediastinal contours are normal. There is decreased bronchial thickening, however small patchy opacity in the right lower lobe. There is no free intra-abdominal air. Mild gaseous distention of bowel loops in the central abdomen, no disproportionate bowel dilatation to suggest obstruction. No radiopaque calculi. Surgical clips are seen in the region of the gastroesophageal junction. No acute osseous abnormalities are seen.  IMPRESSION: 1. Minimal patchy airspace opacity right lower lobe concerning for pneumonia. 2. Mild nonspecific gaseous distention of bowel loops. This may reflect ileus or enteritis. No findings of bowel obstruction. No free air.    Electronically Signed   By: Jeb Levering M.D.   On: 08/31/2014 06:02    Microbiology: Recent Results (from the past 240 hour(s))  Blood culture (routine x 2)     Status: None (Preliminary result)   Collection Time: 08/31/14  6:40 AM  Result Value Ref Range Status   Specimen Description BLOOD ARM LEFT  Final   Special Requests BOTTLES DRAWN AEROBIC AND ANAEROBIC 10CC  Final   Culture   Final           BLOOD CULTURE RECEIVED NO GROWTH TO DATE CULTURE WILL BE HELD FOR 5 DAYS BEFORE ISSUING A FINAL NEGATIVE REPORT Performed at [facility]    Report Status PENDING  Incomplete  Blood culture (routine x 2)     Status: None (Preliminary result)   Collection Time: 08/31/14  6:45 AM  Result Value Ref Range Status   Specimen Description BLOOD HAND LEFT  Final   Special Requests BOTTLES DRAWN AEROBIC ONLY 10CC  Final   Culture   Final           BLOOD CULTURE RECEIVED NO GROWTH TO DATE CULTURE WILL BE HELD FOR 5 DAYS BEFORE ISSUING A FINAL NEGATIVE REPORT Performed at [facility]    Report Status PENDING  Incomplete     Labs: Basic Metabolic Panel:  Recent Labs Lab 08/31/14 0430 08/31/14 1510 09/01/14 0725 09/02/14 0720 09/03/14 0646 09/05/14 0705  NA 125* 133* 134* 135 137 134*  K 2.5* 3.3* 3.4* 4.3 4.5 4.2  CL 86* 100 103 105 104 101  CO2 30 29 27 27 26 26   GLUCOSE 98 119* 111* 94 98 130*  BUN 6 5* <5* <5* <5* 11  CREATININE 0.67 0.74 0.79 0.76 0.75 0.80  CALCIUM 7.3* 7.2* 7.6* 8.0* 8.4 8.9  MG 1.8  --   --  1.8 2.0  --    Liver Function Tests:  Recent Labs Lab 08/31/14 0430 09/01/14 0725  AST 16 13  ALT 11 8  ALKPHOS 71 58  BILITOT 0.8 0.4  PROT 5.6* 4.8*  ALBUMIN 3.0* 2.4*    Recent Labs Lab 08/31/14 0430  LIPASE 22   No results for input(s): AMMONIA in the last 168 hours. CBC:  Recent Labs Lab 08/31/14 0430 09/01/14 0725 09/02/14 0720 09/03/14 0646 09/05/14 0705  WBC 12.8* 8.5 12.0* 9.7 8.4  NEUTROABS 10.3*  --   --  7.4 6.1   HGB 12.5* 10.8* 11.2* 10.8* 11.8*  HCT 36.0* 32.6* 35.0* 33.3* 37.0*  MCV 83.1 87.4 88.8 88.1 90.0  PLT 336 303 328 326 344   Cardiac Enzymes:  Recent Labs Lab 08/31/14 0430  TROPONINI 0.03   BNP: BNP (last 3 results) No results for input(s): BNP in the last 8760 hours.  ProBNP (last 3 results)  Recent Labs  04/14/14 1911  PROBNP 49.4    CBG: No results for input(s): GLUCAP in the last 168 hours.     Signed:  Estevan Kersh A  [facility] 09/05/2014, 11:19 AM

## 2014-09-05 NOTE — Progress Notes (Signed)
09/05/14 Patient to be discharged today,. IV site removed and discharge instrctions reviewed with patient and scripts given to patient.

## 2014-09-05 NOTE — Care Management Note (Signed)
    Page 1 of 1   09/05/2014     2:49:32 PM CARE MANAGEMENT NOTE 09/05/2014  Patient:  Samuel Reyes, Samuel Reyes   Account Number:  [id number]  Date Initiated:  08/31/2014  Documentation initiated by:  Tomi Bamberger  Subjective/Objective Assessment:   Patient is diagnosed with Healthcare-Associated Pneumonia.     Action/Plan:   discharge planning   Anticipated DC Date:  09/05/2014   Anticipated DC Plan:  [place]  CM consult      Choice offered to / List presented to:     DME arranged  [facility]      DME agency  [facility]        Status of service:  Completed, signed off Medicare Important Message given?  YES (If response is "NO", the following Medicare IM given date fields will be blank) Date Medicare IM given:  08/30/2014 Medicare IM given by:  Tomi Bamberger Date Additional Medicare IM given:   Additional Medicare IM given by:    Discharge Disposition:  HOME/SELF CARE  Per UR Regulation:    If discussed at Long Length of Stay Meetings, dates discussed:    Comments:  09/04/14 14:25 Cm called DME rep, Jeneen Rinks to pleae deliver cane to room prior to discharge today.  No ohter Cm needs were communicated.  [place], BSN, Cm [phone].

## 2014-09-06 LAB — CULTURE, BLOOD (ROUTINE X 2)
CULTURE: NO GROWTH
CULTURE: NO GROWTH

## 2014-09-09 ENCOUNTER — Other Ambulatory Visit: Payer: Self-pay | Admitting: Internal Medicine

## 2014-09-09 DIAGNOSIS — I10 Essential (primary) hypertension: Secondary | ICD-10-CM

## 2014-09-09 MED ORDER — METOPROLOL TARTRATE 25 MG PO TABS: 25.0000 mg | ORAL_TABLET | Freq: Two times a day (BID) | ORAL | Status: DC

## 2014-09-09 NOTE — Telephone Encounter (Signed)
Pharmacy called to request a med refill for HTCZ, Metoprolol. Please f/u with pharmacy

## 2014-09-09 NOTE — Telephone Encounter (Signed)
Rx metoprolol send to [place] [facility] need OV prior future refills

## 2014-09-17 ENCOUNTER — Telehealth: Payer: Self-pay | Admitting: *Deleted

## 2014-09-21 NOTE — Telephone Encounter (Signed)
Open in error

## 2014-09-22 ENCOUNTER — Encounter: Payer: Self-pay | Admitting: Internal Medicine

## 2014-09-22 ENCOUNTER — Ambulatory Visit: Payer: Medicaid Other | Attending: Internal Medicine | Admitting: Internal Medicine

## 2014-09-22 VITALS — BP 130/70 | HR 97 | Temp 97.8°F | Resp 16 | Wt 158.8 lb

## 2014-09-22 DIAGNOSIS — J449 Chronic obstructive pulmonary disease, unspecified: Secondary | ICD-10-CM | POA: Diagnosis not present

## 2014-09-22 DIAGNOSIS — Z7982 Long term (current) use of aspirin: Secondary | ICD-10-CM | POA: Diagnosis not present

## 2014-09-22 DIAGNOSIS — F1020 Alcohol dependence, uncomplicated: Secondary | ICD-10-CM | POA: Diagnosis not present

## 2014-09-22 DIAGNOSIS — I341 Nonrheumatic mitral (valve) prolapse: Secondary | ICD-10-CM | POA: Insufficient documentation

## 2014-09-22 DIAGNOSIS — K219 Gastro-esophageal reflux disease without esophagitis: Secondary | ICD-10-CM | POA: Diagnosis not present

## 2014-09-22 DIAGNOSIS — I10 Essential (primary) hypertension: Secondary | ICD-10-CM | POA: Diagnosis present

## 2014-09-22 DIAGNOSIS — IMO0001 Reserved for inherently not codable concepts without codable children: Secondary | ICD-10-CM

## 2014-09-22 DIAGNOSIS — F17200 Nicotine dependence, unspecified, uncomplicated: Secondary | ICD-10-CM

## 2014-09-22 DIAGNOSIS — M25561 Pain in right knee: Secondary | ICD-10-CM | POA: Diagnosis not present

## 2014-09-22 DIAGNOSIS — Z79899 Other long term (current) drug therapy: Secondary | ICD-10-CM | POA: Diagnosis not present

## 2014-09-22 DIAGNOSIS — Z791 Long term (current) use of non-steroidal anti-inflammatories (NSAID): Secondary | ICD-10-CM | POA: Diagnosis not present

## 2014-09-22 DIAGNOSIS — M1990 Unspecified osteoarthritis, unspecified site: Secondary | ICD-10-CM | POA: Diagnosis not present

## 2014-09-22 DIAGNOSIS — Z8673 Personal history of transient ischemic attack (TIA), and cerebral infarction without residual deficits: Secondary | ICD-10-CM | POA: Insufficient documentation

## 2014-09-22 DIAGNOSIS — F17210 Nicotine dependence, cigarettes, uncomplicated: Secondary | ICD-10-CM | POA: Insufficient documentation

## 2014-09-22 DIAGNOSIS — Z720 Tobacco use: Secondary | ICD-10-CM

## 2014-09-22 DIAGNOSIS — F329 Major depressive disorder, single episode, unspecified: Secondary | ICD-10-CM | POA: Diagnosis not present

## 2014-09-22 DIAGNOSIS — Z792 Long term (current) use of antibiotics: Secondary | ICD-10-CM | POA: Insufficient documentation

## 2014-09-22 LAB — COMPLETE METABOLIC PANEL WITH GFR
ALK PHOS: 75 U/L (ref 39–117)
ALT: 11 U/L (ref 0–53)
AST: 15 U/L (ref 0–37)
Albumin: 3.8 g/dL (ref 3.5–5.2)
BUN: 5 mg/dL — ABNORMAL LOW (ref 6–23)
CO2: 25 mEq/L (ref 19–32)
Calcium: 9 mg/dL (ref 8.4–10.5)
Chloride: 102 mEq/L (ref 96–112)
Creat: 0.95 mg/dL (ref 0.50–1.35)
GFR, EST NON AFRICAN AMERICAN: 87 mL/min
GFR, Est African American: >89 mL/min
GLUCOSE: 91 mg/dL (ref 70–99)
POTASSIUM: 4.7 meq/L (ref 3.5–5.3)
Sodium: 138 mEq/L (ref 135–145)
TOTAL PROTEIN: 6.8 g/dL (ref 6.0–8.3)
Total Bilirubin: 0.2 mg/dL (ref 0.2–1.2)

## 2014-09-22 MED ORDER — METOPROLOL TARTRATE 25 MG PO TABS: 25.0000 mg | ORAL_TABLET | Freq: Two times a day (BID) | ORAL | Status: DC

## 2014-09-22 MED ORDER — NAPROXEN 500 MG PO TABS: 500.0000 mg | ORAL_TABLET | Freq: Two times a day (BID) | ORAL | Status: DC

## 2014-09-22 MED ORDER — IPRATROPIUM-ALBUTEROL 18-103 MCG/ACT IN AERO: 2.0000 | INHALATION_SPRAY | Freq: Two times a day (BID) | RESPIRATORY_TRACT | Status: DC

## 2014-09-22 MED ORDER — NICOTINE 21 MG/24HR TD PT24: 21.0000 mg | MEDICATED_PATCH | Freq: Every day | TRANSDERMAL | Status: DC

## 2014-09-22 NOTE — Patient Instructions (Signed)
Smoking Cessation Quitting smoking is important to your health and has many advantages. However, it is not always easy to quit since nicotine is a very addictive drug. Oftentimes, people try 3 times or more before being able to quit. This document explains the best ways for you to prepare to quit smoking. Quitting takes hard work and a lot of effort, but you can do it. ADVANTAGES OF QUITTING SMOKING  You will live longer, feel better, and live better.  Your body will feel the impact of quitting smoking almost immediately.  Within 20 minutes, blood pressure decreases. Your pulse returns to its normal level.  After 8 hours, carbon monoxide levels in the blood return to normal. Your oxygen level increases.  After 24 hours, the chance of having a heart attack starts to decrease. Your breath, hair, and body stop smelling like smoke.  After 48 hours, damaged nerve endings begin to recover. Your sense of taste and smell improve.  After 72 hours, the body is virtually free of nicotine. Your bronchial tubes relax and breathing becomes easier.  After 2 to 12 weeks, lungs can hold more air. Exercise becomes easier and circulation improves.  The risk of having a heart attack, stroke, cancer, or lung disease is greatly reduced.  After 1 year, the risk of coronary heart disease is cut in half.  After 5 years, the risk of stroke falls to the same as a nonsmoker.  After 10 years, the risk of lung cancer is cut in half and the risk of other cancers decreases significantly.  After 15 years, the risk of coronary heart disease drops, usually to the level of a nonsmoker.  If you are pregnant, quitting smoking will improve your chances of having a healthy baby.  The people you live with, especially any children, will be healthier.  You will have extra money to spend on things other than cigarettes. QUESTIONS TO THINK ABOUT BEFORE ATTEMPTING TO QUIT You may want to talk about your answers with your  health care provider.  Why do you want to quit?  If you tried to quit in the past, what helped and what did not?  What will be the most difficult situations for you after you quit? How will you plan to handle them?  Who can help you through the tough times? Your family? Friends? A health care provider?  What pleasures do you get from smoking? What ways can you still get pleasure if you quit? Here are some questions to ask your health care provider:  How can you help me to be successful at quitting?  What medicine do you think would be best for me and how should I take it?  What should I do if I need more help?  What is smoking withdrawal like? How can I get information on withdrawal? GET READY  Set a quit date.  Change your environment by getting rid of all cigarettes, ashtrays, matches, and lighters in your home, car, or work. Do not let people smoke in your home.  Review your past attempts to quit. Think about what worked and what did not. GET SUPPORT AND ENCOURAGEMENT You have a better chance of being successful if you have help. You can get support in many ways.  Tell your family, friends, and coworkers that you are going to quit and need their support. Ask them not to smoke around you.  Get individual, group, or telephone counseling and support. Programs are available at local hospitals and health centers. Call   your local health department for information about programs in your area.  Spiritual beliefs and practices may help some smokers quit.  Download a "quit meter" on your computer to keep track of quit statistics, such as how long you have gone without smoking, cigarettes not smoked, and money saved.  Get a self-help book about quitting smoking and staying off tobacco. LEARN NEW SKILLS AND BEHAVIORS  Distract yourself from urges to smoke. Talk to someone, go for a walk, or occupy your time with a task.  Change your normal routine. Take a different route to work.  Drink tea instead of coffee. Eat breakfast in a different place.  Reduce your stress. Take a hot bath, exercise, or read a book.  Plan something enjoyable to do every day. Reward yourself for not smoking.  Explore interactive web-based programs that specialize in helping you quit. GET MEDICINE AND USE IT CORRECTLY Medicines can help you stop smoking and decrease the urge to smoke. Combining medicine with the above behavioral methods and support can greatly increase your chances of successfully quitting smoking.  Nicotine replacement therapy helps deliver nicotine to your body without the negative effects and risks of smoking. Nicotine replacement therapy includes nicotine gum, lozenges, inhalers, nasal sprays, and skin patches. Some may be available over-the-counter and others require a prescription.  Antidepressant medicine helps people abstain from smoking, but how this works is unknown. This medicine is available by prescription.  Nicotinic receptor partial agonist medicine simulates the effect of nicotine in your brain. This medicine is available by prescription. Ask your health care provider for advice about which medicines to use and how to use them based on your health history. Your health care provider will tell you what side effects to look out for if you choose to be on a medicine or therapy. Carefully read the information on the package. Do not use any other product containing nicotine while using a nicotine replacement product.  RELAPSE OR DIFFICULT SITUATIONS Most relapses occur within the first 3 months after quitting. Do not be discouraged if you start smoking again. Remember, most people try several times before finally quitting. You may have symptoms of withdrawal because your body is used to nicotine. You may crave cigarettes, be irritable, feel very hungry, cough often, get headaches, or have difficulty concentrating. The withdrawal symptoms are only temporary. They are strongest  when you first quit, but they will go away within 10-14 days. To reduce the chances of relapse, try to:  Avoid drinking alcohol. Drinking lowers your chances of successfully quitting.  Reduce the amount of caffeine you consume. Once you quit smoking, the amount of caffeine in your body increases and can give you symptoms, such as a rapid heartbeat, sweating, and anxiety.  Avoid smokers because they can make you want to smoke.  Do not let weight gain distract you. Many smokers will gain weight when they quit, usually less than 10 pounds. Eat a healthy diet and stay active. You can always lose the weight gained after you quit.  Find ways to improve your mood other than smoking. FOR MORE INFORMATION  www.smokefree.gov  Document Released: 07/10/2001 Document Revised: 11/30/2013 Document Reviewed: 10/25/2011 ExitCare Patient Information 2015 [employer]. This information is not intended to replace advice given to you by your health care provider. Make sure you discuss any questions you have with your health care provider. DASH Eating Plan DASH stands for "Dietary Approaches to Stop Hypertension." The DASH eating plan is a healthy eating plan that has   been shown to reduce high blood pressure (hypertension). Additional health benefits may include reducing the risk of type 2 diabetes mellitus, heart disease, and stroke. The DASH eating plan may also help with weight loss. WHAT DO I NEED TO KNOW ABOUT THE DASH EATING PLAN? For the DASH eating plan, you will follow these general guidelines:  Choose foods with a percent daily value for sodium of less than 5% (as listed on the food label).  Use salt-free seasonings or herbs instead of table salt or sea salt.  Check with your health care provider or pharmacist before using salt substitutes.  Eat lower-sodium products, often labeled as "lower sodium" or "no salt added."  Eat fresh foods.  Eat more vegetables, fruits, and low-fat dairy  products.  Choose whole grains. Look for the word "whole" as the first word in the ingredient list.  Choose fish and skinless chicken or turkey more often than red meat. Limit fish, poultry, and meat to 6 oz (170 g) each day.  Limit sweets, desserts, sugars, and sugary drinks.  Choose heart-healthy fats.  Limit cheese to 1 oz (28 g) per day.  Eat more home-cooked food and less restaurant, buffet, and fast food.  Limit fried foods.  Cook foods using methods other than frying.  Limit canned vegetables. If you do use them, rinse them well to decrease the sodium.  When eating at a restaurant, ask that your food be prepared with less salt, or no salt if possible. WHAT FOODS CAN I EAT? Seek help from a dietitian for individual calorie needs. Grains Whole grain or whole wheat bread. Brown rice. Whole grain or whole wheat pasta. Quinoa, bulgur, and whole grain cereals. Low-sodium cereals. Corn or whole wheat flour tortillas. Whole grain cornbread. Whole grain crackers. Low-sodium crackers. Vegetables Fresh or frozen vegetables (raw, steamed, roasted, or grilled). Low-sodium or reduced-sodium tomato and vegetable juices. Low-sodium or reduced-sodium tomato sauce and paste. Low-sodium or reduced-sodium canned vegetables.  Fruits All fresh, canned (in natural juice), or frozen fruits. Meat and Other Protein Products Ground beef (85% or leaner), grass-fed beef, or beef trimmed of fat. Skinless chicken or turkey. Ground chicken or turkey. Pork trimmed of fat. All fish and seafood. Eggs. Dried beans, peas, or lentils. Unsalted nuts and seeds. Unsalted canned beans. Dairy Low-fat dairy products, such as skim or 1% milk, 2% or reduced-fat cheeses, low-fat ricotta or cottage cheese, or plain low-fat yogurt. Low-sodium or reduced-sodium cheeses. Fats and Oils Tub margarines without trans fats. Light or reduced-fat mayonnaise and salad dressings (reduced sodium). Avocado. Safflower, olive, or canola  oils. Natural peanut or almond butter. Other Unsalted popcorn and pretzels. The items listed above may not be a complete list of recommended foods or beverages. Contact your dietitian for more options. WHAT FOODS ARE NOT RECOMMENDED? Grains White bread. White pasta. White rice. Refined cornbread. Bagels and croissants. Crackers that contain trans fat. Vegetables Creamed or fried vegetables. Vegetables in a cheese sauce. Regular canned vegetables. Regular canned tomato sauce and paste. Regular tomato and vegetable juices. Fruits Dried fruits. Canned fruit in light or heavy syrup. Fruit juice. Meat and Other Protein Products Fatty cuts of meat. Ribs, chicken wings, bacon, sausage, bologna, salami, chitterlings, fatback, hot dogs, bratwurst, and packaged luncheon meats. Salted nuts and seeds. Canned beans with salt. Dairy Whole or 2% milk, cream, half-and-half, and cream cheese. Whole-fat or sweetened yogurt. Full-fat cheeses or blue cheese. Nondairy creamers and whipped toppings. Processed cheese, cheese spreads, or cheese curds. Condiments Onion and garlic salt,   seasoned salt, table salt, and sea salt. Canned and packaged gravies. Worcestershire sauce. Tartar sauce. Barbecue sauce. Teriyaki sauce. Soy sauce, including reduced sodium. Steak sauce. Fish sauce. Oyster sauce. Cocktail sauce. Horseradish. Ketchup and mustard. Meat flavorings and tenderizers. Bouillon cubes. Hot sauce. Tabasco sauce. Marinades. Taco seasonings. Relishes. Fats and Oils Butter, stick margarine, lard, shortening, ghee, and bacon fat. Coconut, palm kernel, or palm oils. Regular salad dressings. Other Pickles and olives. Salted popcorn and pretzels. The items listed above may not be a complete list of foods and beverages to avoid. Contact your dietitian for more information. WHERE CAN I FIND MORE INFORMATION? National Heart, Lung, and Blood Institute: www.nhlbi.nih.gov/health/health-topics/topics/dash/ Document Released:  07/05/2011 Document Revised: 11/30/2013 Document Reviewed: 05/20/2013 ExitCare Patient Information 2015 [employer]. This information is not intended to replace advice given to you by your health care provider. Make sure you discuss any questions you have with your health care provider.  

## 2014-09-22 NOTE — Progress Notes (Signed)
Patient here for follow up on his COPD and medication refill

## 2014-09-22 NOTE — Progress Notes (Signed)
MRN: [id number] Name: Samuel Reyes  Sex: male Age: 59 y.o. DOB: 1956/02/16  Allergies: Bee venom and Penicillins  Chief Complaint  Patient presents with  . Follow-up    HPI: Patient is 59 y.o. male who history of hypertension, COPD, patient comes today for followup, denies any acute symptoms his blood pressure is 130/70 manually, is requesting refill on his medications, patient is to smoke cigarettes, I have counseled patient to quit smoking. Patient wants to try nicotine patch. Patient also history of right knee DJD/arthritis requesting refill on pain medication.  Past Medical History  Diagnosis Date  . Cirrhosis   . Degenerative joint disease   . Hypertension   . Gastric ulcer   . Depression   . Asthma   . Mitral valve prolapse 2002  . H/O hiatal hernia   . COPD (chronic obstructive pulmonary disease)   . TIA (transient ischemic attack)     2010  . Stroke     TIA - 2010 - no deficits   . Shortness of breath     with exertion   . GERD (gastroesophageal reflux disease)   . Headache(784.0)   . Hiatal hernia 1982  . Degenerative joint disease   . Anxiety   . Alcohol abuse     Past Surgical History  Procedure Laterality Date  . Gastrectomy    . Shoulder surgery Bilateral     3 surgeries on on left, 2 surgeries on right   . Rt knee arthroscopic surgery    . Back surgery      3 cervical spine surgeries C4-C5 fused  . Hernia repair    . Finger surgery Left     2nd, 3rd, & 4th fingers were cut off by table saw and reattached  . Colonoscopy N/A 01/04/2014    Procedure: COLONOSCOPY;  Surgeon: Danie Binder, MD;  Location: [facility];  Service: Endoscopy;  Laterality: N/A;  1:45  . Esophagogastroduodenoscopy N/A 01/04/2014    Procedure: ESOPHAGOGASTRODUODENOSCOPY (EGD);  Surgeon: Danie Binder, MD;  Location: [facility];  Service: Endoscopy;  Laterality: N/A;  . Incisional hernia repair N/A 01/20/2014    Procedure: LAPAROSCOPIC RECURRENT  INCISIONAL HERNIA with  mesh;  Surgeon: Edward Jolly, MD;  Location: [facility];  Service: General;  Laterality: N/A;      Medication List       This list is accurate as of: 09/22/14  1:42 PM.  Always use your most recent med list.               acamprosate 333 MG tablet  Commonly known as:  CAMPRAL  Take 2 tablets (666 mg total) by mouth 3 (three) times daily with meals. For alcoholism     albuterol 108 (90 BASE) MCG/ACT inhaler  Commonly known as:  PROVENTIL HFA;VENTOLIN HFA  Inhale 2 puffs into the lungs every 6 (six) hours as needed for wheezing or shortness of breath.     albuterol-ipratropium 18-103 MCG/ACT inhaler  Commonly known as:  COMBIVENT  Inhale 2 puffs into the lungs 2 (two) times daily. For shortness of breath     amoxicillin-clavulanate 875-125 MG per tablet  Commonly known as:  AUGMENTIN  Take 1 tablet by mouth every 12 (twelve) hours.     ARIPiprazole 5 MG tablet  Commonly known as:  ABILIFY  Take 5 mg by mouth daily.     aspirin 81 MG chewable tablet  Chew 1 tablet (81 mg total) by mouth daily.  FLUoxetine 40 MG capsule  Commonly known as:  PROZAC  Take 2 capsules (80 mg total) by mouth daily. For depression     gabapentin 300 MG capsule  Commonly known as:  NEURONTIN  Take 1 capsule (300 mg total) by mouth 2 (two) times daily. For substance withdrawal syndrome     metoprolol tartrate 25 MG tablet  Commonly known as:  LOPRESSOR  Take 1 tablet (25 mg total) by mouth 2 (two) times daily. For high blood pressure     naproxen 500 MG tablet  Commonly known as:  NAPROSYN  Take 1 tablet (500 mg total) by mouth 2 (two) times daily with a meal.     nicotine 21 mg/24hr patch  Commonly known as:  NICODERM CQ - dosed in mg/24 hours  Place 1 patch (21 mg total) onto the skin daily.     pantoprazole 40 MG tablet  Commonly known as:  PROTONIX  Take 1 tablet (40 mg total) by mouth daily.     traZODone 150 MG tablet  Commonly known as:  DESYREL  Take 2 tablets (300 mg  total) by mouth at bedtime. For sleep        Meds ordered this encounter  Medications  . metoprolol tartrate (LOPRESSOR) 25 MG tablet    Sig: Take 1 tablet (25 mg total) by mouth 2 (two) times daily. For high blood pressure    Dispense:  60 tablet    Refill:  2  . albuterol-ipratropium (COMBIVENT) 18-103 MCG/ACT inhaler    Sig: Inhale 2 puffs into the lungs 2 (two) times daily. For shortness of breath    Dispense:  1 Inhaler    Refill:  5  . naproxen (NAPROSYN) 500 MG tablet    Sig: Take 1 tablet (500 mg total) by mouth 2 (two) times daily with a meal.    Dispense:  30 tablet    Refill:  2  . nicotine (NICODERM CQ - DOSED IN MG/24 HOURS) 21 mg/24hr patch    Sig: Place 1 patch (21 mg total) onto the skin daily.    Dispense:  28 patch    Refill:  0    Immunization History  Administered Date(s) Administered  . Influenza Split 06/15/2013  . Influenza,inj,Quad PF,36+ Mos 07/23/2014  . Pneumococcal Polysaccharide-23 02/01/2013, 07/23/2014    Family History  Problem Relation Age of Onset  . Cancer Father     bone  . Cancer Brother     lungs  . Stroke Maternal Grandmother   . Colon cancer Neg Hx   . Asthma Son     died at age 88 in his sleep   . Spina bifida Son     died at age 82     History  Substance Use Topics  . Smoking status: Current Every Day Smoker -- 1.00 packs/day for 45 years    Types: Cigarettes    Start date: 07/30/1966  . Smokeless tobacco: Never Used  . Alcohol Use: 3.6 oz/week    6 Cans of beer per week     Comment: 1 case of beer and 1 shot of vodka between 1030am-1430pm    Review of Systems   As noted in HPI  Filed Vitals:   09/22/14 1052  BP: 130/70  Pulse:   Temp:   Resp:     Physical Exam  Physical Exam  Constitutional: No distress.  Eyes: EOM are normal. Pupils are equal, round, and reactive to light.  Cardiovascular: Normal rate and regular rhythm.  Pulmonary/Chest: Breath sounds normal. No respiratory distress. He has no  wheezes. He has no rales.  Musculoskeletal:  Right knee crepitation+    CBC    Component Value Date/Time   WBC 8.4 09/05/2014 0705   RBC 4.11* 09/05/2014 0705   HGB 11.8* 09/05/2014 0705   HCT 37.0* 09/05/2014 0705   PLT 344 09/05/2014 0705   MCV 90.0 09/05/2014 0705   LYMPHSABS 1.3 09/05/2014 0705   MONOABS 1.0 09/05/2014 0705   EOSABS 0.0 09/05/2014 0705   BASOSABS 0.0 09/05/2014 0705    CMP     Component Value Date/Time   NA 134* 09/05/2014 0705   K 4.2 09/05/2014 0705   CL 101 09/05/2014 0705   CO2 26 09/05/2014 0705   GLUCOSE 130* 09/05/2014 0705   BUN 11 09/05/2014 0705   CREATININE 0.80 09/05/2014 0705   CREATININE 0.87 02/17/2013 1541   CALCIUM 8.9 09/05/2014 0705   PROT 4.8* 09/01/2014 0725   ALBUMIN 2.4* 09/01/2014 0725   AST 13 09/01/2014 0725   ALT 8 09/01/2014 0725   ALKPHOS 58 09/01/2014 0725   BILITOT 0.4 09/01/2014 0725   GFRNONAA >90 09/05/2014 0705   GFRNONAA >89 02/17/2013 1541   GFRAA >90 09/05/2014 0705   GFRAA >89 02/17/2013 1541    Lab Results  Component Value Date/Time   CHOL 247* 02/17/2013 03:41 PM    No components found for: HGA1C  Lab Results  Component Value Date/Time   AST 13 09/01/2014 07:25 AM    Assessment and Plan  Essential hypertension - Plan: advised patient for DASH diet, continue with metoprolol tartrate (LOPRESSOR) 25 MG tablet, COMPLETE METABOLIC PANEL WITH GFR  Chronic obstructive pulmonary disease, unspecified COPD, unspecified chronic bronchitis type - Plan: albuterol-ipratropium (COMBIVENT) 18-103 MCG/ACT inhaler  Knee pain, right - Plan: naproxen (NAPROSYN) 500 MG tablet  Smoking - Plan: nicotine (NICODERM CQ - DOSED IN MG/24 HOURS) 21 mg/24hr patch   Health Maintenance  -Vaccinations:  Up-to-date with flu shot and Pneumovax  Return in about 3 months (around 12/21/2014) for hypertension.   This note has been created with Surveyor, quantity. Any  transcriptional errors are unintentional.    Lorayne Marek, MD

## 2014-09-23 ENCOUNTER — Telehealth: Payer: Self-pay | Admitting: Internal Medicine

## 2014-09-23 NOTE — Telephone Encounter (Signed)
Patients case manager called stating that the patient was seen yesterday and needed a referral for pain management. Case manager would like the referral to be placed to treat his pain. Please f/u with nurse.

## 2014-11-02 ENCOUNTER — Other Ambulatory Visit: Payer: Self-pay | Admitting: Internal Medicine

## 2014-12-10 ENCOUNTER — Ambulatory Visit (HOSPITAL_COMMUNITY): Admit: 2014-12-10 | Discharge: 2014-12-10 | Disposition: A | Discharge status: Home / Self Care | Payer: Medicaid Other

## 2014-12-10 ENCOUNTER — Other Ambulatory Visit: Payer: Self-pay

## 2014-12-10 ENCOUNTER — Emergency Department (HOSPITAL_COMMUNITY)
Admission: EM | Admit: 2014-12-10 | Discharge: 2014-12-10 | Disposition: A | Discharge status: Home / Self Care | Payer: Medicaid Other | Attending: Emergency Medicine | Admitting: Emergency Medicine

## 2014-12-10 ENCOUNTER — Encounter (HOSPITAL_COMMUNITY): Payer: Self-pay | Admitting: *Deleted

## 2014-12-10 ENCOUNTER — Ambulatory Visit: Payer: Medicaid Other | Attending: Internal Medicine | Admitting: Internal Medicine

## 2014-12-10 ENCOUNTER — Encounter: Payer: Self-pay | Admitting: Internal Medicine

## 2014-12-10 ENCOUNTER — Emergency Department (HOSPITAL_COMMUNITY): Payer: Medicaid Other

## 2014-12-10 VITALS — BP 159/93 | HR 90 | Temp 97.9°F | Resp 15 | Wt 154.8 lb

## 2014-12-10 DIAGNOSIS — Z880 Allergy status to penicillin: Secondary | ICD-10-CM | POA: Insufficient documentation

## 2014-12-10 DIAGNOSIS — J441 Chronic obstructive pulmonary disease with (acute) exacerbation: Secondary | ICD-10-CM | POA: Insufficient documentation

## 2014-12-10 DIAGNOSIS — I10 Essential (primary) hypertension: Secondary | ICD-10-CM | POA: Diagnosis not present

## 2014-12-10 DIAGNOSIS — F419 Anxiety disorder, unspecified: Secondary | ICD-10-CM | POA: Insufficient documentation

## 2014-12-10 DIAGNOSIS — R109 Unspecified abdominal pain: Secondary | ICD-10-CM

## 2014-12-10 DIAGNOSIS — Z8739 Personal history of other diseases of the musculoskeletal system and connective tissue: Secondary | ICD-10-CM | POA: Diagnosis not present

## 2014-12-10 DIAGNOSIS — R079 Chest pain, unspecified: Secondary | ICD-10-CM | POA: Diagnosis not present

## 2014-12-10 DIAGNOSIS — K2970 Gastritis, unspecified, without bleeding: Secondary | ICD-10-CM | POA: Diagnosis not present

## 2014-12-10 DIAGNOSIS — F329 Major depressive disorder, single episode, unspecified: Secondary | ICD-10-CM | POA: Diagnosis not present

## 2014-12-10 DIAGNOSIS — K219 Gastro-esophageal reflux disease without esophagitis: Secondary | ICD-10-CM | POA: Diagnosis not present

## 2014-12-10 DIAGNOSIS — F17200 Nicotine dependence, unspecified, uncomplicated: Secondary | ICD-10-CM

## 2014-12-10 DIAGNOSIS — Z903 Acquired absence of stomach [part of]: Secondary | ICD-10-CM | POA: Insufficient documentation

## 2014-12-10 DIAGNOSIS — Z8673 Personal history of transient ischemic attack (TIA), and cerebral infarction without residual deficits: Secondary | ICD-10-CM | POA: Diagnosis not present

## 2014-12-10 DIAGNOSIS — Z9889 Other specified postprocedural states: Secondary | ICD-10-CM | POA: Diagnosis not present

## 2014-12-10 DIAGNOSIS — Z79899 Other long term (current) drug therapy: Secondary | ICD-10-CM | POA: Diagnosis not present

## 2014-12-10 DIAGNOSIS — R112 Nausea with vomiting, unspecified: Secondary | ICD-10-CM

## 2014-12-10 DIAGNOSIS — Z720 Tobacco use: Secondary | ICD-10-CM | POA: Insufficient documentation

## 2014-12-10 DIAGNOSIS — J449 Chronic obstructive pulmonary disease, unspecified: Secondary | ICD-10-CM

## 2014-12-10 DIAGNOSIS — R0602 Shortness of breath: Secondary | ICD-10-CM

## 2014-12-10 LAB — I-STAT CG4 LACTIC ACID, ED: Lactic Acid, Venous: 1.16 mmol/L (ref 0.5–2.0)

## 2014-12-10 LAB — COMPREHENSIVE METABOLIC PANEL
ALBUMIN: 3.4 g/dL — AB (ref 3.5–5.0)
ALK PHOS: 79 U/L (ref 38–126)
ALT: 65 U/L — ABNORMAL HIGH (ref 17–63)
ANION GAP: 9 (ref 5–15)
AST: 72 U/L — ABNORMAL HIGH (ref 15–41)
BUN: 6 mg/dL (ref 6–20)
CO2: 29 mmol/L (ref 22–32)
Calcium: 8.5 mg/dL — ABNORMAL LOW (ref 8.9–10.3)
Chloride: 96 mmol/L — ABNORMAL LOW (ref 101–111)
Creatinine, Ser: 0.95 mg/dL (ref 0.61–1.24)
GFR calc non Af Amer: >60 mL/min (ref >60)
Glucose, Bld: 89 mg/dL (ref 65–99)
POTASSIUM: 3.5 mmol/L (ref 3.5–5.1)
SODIUM: 134 mmol/L — AB (ref 135–145)
Total Bilirubin: 0.6 mg/dL (ref 0.3–1.2)
Total Protein: 6.5 g/dL (ref 6.5–8.1)

## 2014-12-10 LAB — CBC
HCT: 39.8 % (ref 39.0–52.0)
Hemoglobin: 12.7 g/dL — ABNORMAL LOW (ref 13.0–17.0)
MCH: 27.5 pg (ref 26.0–34.0)
MCHC: 31.9 g/dL (ref 30.0–36.0)
MCV: 86.1 fL (ref 78.0–100.0)
Platelets: 298 10*3/uL (ref 150–400)
RBC: 4.62 MIL/uL (ref 4.22–5.81)
RDW: 17.6 % — ABNORMAL HIGH (ref 11.5–15.5)
WBC: 8.7 10*3/uL (ref 4.0–10.5)

## 2014-12-10 LAB — BRAIN NATRIURETIC PEPTIDE: B Natriuretic Peptide: 46.5 pg/mL (ref 0.0–100.0)

## 2014-12-10 LAB — I-STAT TROPONIN, ED: Troponin i, poc: 0 ng/mL (ref 0.00–0.08)

## 2014-12-10 LAB — LIPASE, BLOOD: LIPASE: 26 U/L (ref 22–51)

## 2014-12-10 MED ORDER — SODIUM CHLORIDE 0.9 % IV BOLUS (SEPSIS)
1000.0000 mL | Freq: Once | INTRAVENOUS | Status: AC
Start: 2014-12-10 — End: 2014-12-10
  Administered 2014-12-10: 1000 mL via INTRAVENOUS

## 2014-12-10 MED ORDER — ONDANSETRON HCL 4 MG/2ML IJ SOLN
4.0000 mg | Freq: Once | INTRAMUSCULAR | Status: AC
  Administered 2014-12-10: 4 mg via INTRAVENOUS
  Filled 2014-12-10: qty 2

## 2014-12-10 MED ORDER — GI COCKTAIL ~~LOC~~
30.0000 mL | Freq: Once | ORAL | Status: AC
  Administered 2014-12-10: 30 mL via ORAL
  Filled 2014-12-10: qty 30

## 2014-12-10 MED ORDER — FAMOTIDINE IN NACL 20-0.9 MG/50ML-% IV SOLN
20.0000 mg | Freq: Once | INTRAVENOUS | Status: AC
  Administered 2014-12-10: 20 mg via INTRAVENOUS
  Filled 2014-12-10: qty 50

## 2014-12-10 MED ORDER — FAMOTIDINE 20 MG PO TABS: 20.0000 mg | ORAL_TABLET | Freq: Two times a day (BID) | ORAL | Status: DC

## 2014-12-10 NOTE — Discharge Instructions (Signed)
Gastritis, Adult °Gastritis is soreness and puffiness (inflammation) of the lining of the stomach. If you do not get help, gastritis can cause bleeding and sores (ulcers) in the stomach. °HOME CARE  °· Only take medicine as told by your doctor. °· If you were given antibiotic medicines, take them as told. Finish the medicines even if you start to feel better. °· Drink enough fluids to keep your pee (urine) clear or pale yellow. °· Avoid foods and drinks that make your problems worse. Foods you may want to avoid include: °¨ Caffeine or alcohol. °¨ Chocolate. °¨ Mint. °¨ Garlic and onions. °¨ Spicy foods. °¨ Citrus fruits, including oranges, lemons, or limes. °¨ Food containing tomatoes, including sauce, chili, salsa, and pizza. °¨ Fried and fatty foods. °· Eat small meals throughout the day instead of large meals. °GET HELP RIGHT AWAY IF:  °· You have black or dark red poop (stools). °· You throw up (vomit) blood. It may look like coffee grounds. °· You cannot keep fluids down. °· Your belly (abdominal) pain gets worse. °· You have a fever. °· You do not feel better after 1 week. °· You have any other questions or concerns. °MAKE SURE YOU:  °· Understand these instructions. °· Will watch your condition. °· Will get help right away if you are not doing well or get worse. °Document Released: 01/02/2008 Document Revised: 10/08/2011 Document Reviewed: 08/29/2011 °[employer]® Patient Information ©2015 [employer]. This information is not intended to replace advice given to you by your health care provider. Make sure you discuss any questions you have with your health care provider. ° °

## 2014-12-10 NOTE — Progress Notes (Signed)
MRN: [id number] Name: Jaiveer Panas  Sex: male Age: 59 y.o. DOB: Feb 20, 1956  Allergies: Bee venom and Penicillins  Chief Complaint  Patient presents with  . Chest Pain    HPI: Patient is 59 y.o. male who history of hypertension COPD GERD, comes today complaining of nausea vomiting diarrhea fever chills abdominal pain chest pain for the last one week, the chest pain he describes as burning sharp, nonradiating, today's blood pressure is elevated denies any headache dizziness, has not been able to eat and has been vomiting.  Past Medical History  Diagnosis Date  . Cirrhosis   . Degenerative joint disease   . Hypertension   . Gastric ulcer   . Depression   . Asthma   . Mitral valve prolapse 2002  . H/O hiatal hernia   . COPD (chronic obstructive pulmonary disease)   . TIA (transient ischemic attack)     2010  . Stroke     TIA - 2010 - no deficits   . Shortness of breath     with exertion   . GERD (gastroesophageal reflux disease)   . Headache(784.0)   . Hiatal hernia 1982  . Degenerative joint disease   . Anxiety   . Alcohol abuse     Past Surgical History  Procedure Laterality Date  . Gastrectomy    . Shoulder surgery Bilateral     3 surgeries on on left, 2 surgeries on right   . Rt knee arthroscopic surgery    . Back surgery      3 cervical spine surgeries C4-C5 fused  . Hernia repair    . Finger surgery Left     2nd, 3rd, & 4th fingers were cut off by table saw and reattached  . Colonoscopy N/A 01/04/2014    Procedure: COLONOSCOPY;  Surgeon: Danie Binder, MD;  Location: [facility];  Service: Endoscopy;  Laterality: N/A;  1:45  . Esophagogastroduodenoscopy N/A 01/04/2014    Procedure: ESOPHAGOGASTRODUODENOSCOPY (EGD);  Surgeon: Danie Binder, MD;  Location: [facility];  Service: Endoscopy;  Laterality: N/A;  . Incisional hernia repair N/A 01/20/2014    Procedure: LAPAROSCOPIC RECURRENT  INCISIONAL HERNIA with mesh;  Surgeon: Edward Jolly, MD;   Location: [facility];  Service: General;  Laterality: N/A;      Medication List       This list is accurate as of: 12/10/14  1:10 PM.  Always use your most recent med list.               acamprosate 333 MG tablet  Commonly known as:  CAMPRAL  Take 2 tablets (666 mg total) by mouth 3 (three) times daily with meals. For alcoholism     albuterol 108 (90 BASE) MCG/ACT inhaler  Commonly known as:  PROVENTIL HFA;VENTOLIN HFA  Inhale 2 puffs into the lungs every 6 (six) hours as needed for wheezing or shortness of breath.     albuterol-ipratropium 18-103 MCG/ACT inhaler  Commonly known as:  COMBIVENT  Inhale 2 puffs into the lungs 2 (two) times daily. For shortness of breath     amoxicillin-clavulanate 875-125 MG per tablet  Commonly known as:  AUGMENTIN  Take 1 tablet by mouth every 12 (twelve) hours.     ARIPiprazole 5 MG tablet  Commonly known as:  ABILIFY  Take 5 mg by mouth daily.     aspirin 81 MG chewable tablet  Chew 1 tablet (81 mg total) by mouth daily.     FLUoxetine  40 MG capsule  Commonly known as:  PROZAC  Take 2 capsules (80 mg total) by mouth daily. For depression     gabapentin 300 MG capsule  Commonly known as:  NEURONTIN  Take 1 capsule (300 mg total) by mouth 2 (two) times daily. For substance withdrawal syndrome     metoprolol tartrate 25 MG tablet  Commonly known as:  LOPRESSOR  Take 1 tablet (25 mg total) by mouth 2 (two) times daily. For high blood pressure     naproxen 500 MG tablet  Commonly known as:  NAPROSYN  Take 1 tablet (500 mg total) by mouth 2 (two) times daily with a meal.     nicotine 21 mg/24hr patch  Commonly known as:  NICODERM CQ - dosed in mg/24 hours  PLACE 1 PATCH ONTO THE SKIN DAILY     pantoprazole 40 MG tablet  Commonly known as:  PROTONIX  Take 1 tablet (40 mg total) by mouth daily.     traZODone 150 MG tablet  Commonly known as:  DESYREL  Take 2 tablets (300 mg total) by mouth at bedtime. For sleep        No orders  of the defined types were placed in this encounter.    Immunization History  Administered Date(s) Administered  . Influenza Split 06/15/2013  . Influenza,inj,Quad PF,36+ Mos 07/23/2014  . Pneumococcal Polysaccharide-23 02/01/2013, 07/23/2014    Family History  Problem Relation Age of Onset  . Cancer Father     bone  . Cancer Brother     lungs  . Stroke Maternal Grandmother   . Colon cancer Neg Hx   . Asthma Son     died at age 66 in his sleep   . Spina bifida Son     died at age 27     History  Substance Use Topics  . Smoking status: Current Every Day Smoker -- 1.00 packs/day for 45 years    Types: Cigarettes    Start date: 07/30/1966  . Smokeless tobacco: Never Used  . Alcohol Use: 3.6 oz/week    6 Cans of beer per week     Comment: 1 case of beer and 1 shot of vodka between 1030am-1430pm    Review of Systems   As noted in HPI  Filed Vitals:   12/10/14 1236  BP: 159/93  Pulse: 90  Temp: 97.9 F (36.6 C)  Resp: 15    Physical Exam  Physical Exam  Constitutional: No distress.  Eyes: EOM are normal. Pupils are equal, round, and reactive to light.  Cardiovascular: Normal rate and regular rhythm.   Pulmonary/Chest: Breath sounds normal. No respiratory distress. He has no wheezes. He has no rales.  Abdominal:  Generalized tenderness and guarding no rebound, hyperactive bowel sounds    CBC    Component Value Date/Time   WBC 8.4 09/05/2014 0705   RBC 4.11* 09/05/2014 0705   HGB 11.8* 09/05/2014 0705   HCT 37.0* 09/05/2014 0705   PLT 344 09/05/2014 0705   MCV 90.0 09/05/2014 0705   LYMPHSABS 1.3 09/05/2014 0705   MONOABS 1.0 09/05/2014 0705   EOSABS 0.0 09/05/2014 0705   BASOSABS 0.0 09/05/2014 0705    CMP     Component Value Date/Time   NA 138 09/22/2014 1106   K 4.7 09/22/2014 1106   CL 102 09/22/2014 1106   CO2 25 09/22/2014 1106   GLUCOSE 91 09/22/2014 1106   BUN 5* 09/22/2014 1106   CREATININE 0.95 09/22/2014 1106  CREATININE 0.80  09/05/2014 0705   CALCIUM 9.0 09/22/2014 1106   PROT 6.8 09/22/2014 1106   ALBUMIN 3.8 09/22/2014 1106   AST 15 09/22/2014 1106   ALT 11 09/22/2014 1106   ALKPHOS 75 09/22/2014 1106   BILITOT 0.2 09/22/2014 1106   GFRNONAA 87 09/22/2014 1106   GFRNONAA >90 09/05/2014 0705   GFRAA >89 09/22/2014 1106   GFRAA >90 09/05/2014 0705    Lab Results  Component Value Date/Time   CHOL 247* 02/17/2013 03:41 PM    No results found for: HGBA1C  Lab Results  Component Value Date/Time   AST 15 09/22/2014 11:06 AM    Assessment and Plan  Chest pain, unspecified chest pain type - Plan: ? Secondary to nausea and vomiting EKG 12-Lead, shows some hyperacute T waves in lateral leads  Essential hypertension  Chronic obstructive pulmonary disease, unspecified COPD, unspecified chronic bronchitis type  Smoking  SOB (shortness of breath)  Non-intractable vomiting with nausea, vomiting of unspecified type  Abdominal pain, unspecified abdominal location  Patient has ongoing symptoms for the last one week and has not been feeling well, I have advised patient to get further evaluation done in the emergency room likely need abdominal imaging study,repeat EKG, troponin, blood work check electrolytes, IV hydration.  Return after discharge.   This note has been created with Surveyor, quantity. Any transcriptional errors are unintentional.    Lorayne Marek, MD

## 2014-12-10 NOTE — ED Notes (Signed)
Pt reports not feeling well x 1 week. Having n/v/d, mid chest pain, sob, generalized fatigue and reports constant drooling and thirst.

## 2014-12-10 NOTE — ED Provider Notes (Signed)
CSN: [id number]     Arrival date & time 12/10/14  1313 History   First MD Initiated Contact with Patient 12/10/14 1331     Chief Complaint  Patient presents with  . Chest Pain  . Shortness of Breath  . Fatigue     (Consider location/radiation/quality/duration/timing/severity/associated sxs/prior Treatment) HPI Comments: The patient is a 59 year old male, he has a history of alcohol abuse, he was seen multiple times for this over the last couple of years, he denies having any alcohol in over one and a half months as well as no tobacco in over a month. No recent antibiotic use travel or sick contacts.  He reports that approximately one week ago he developed nausea vomiting and diarrhea, he states that this is associated with epigastric burning, worse at night when he lays down, he states he has to sleep sitting up because of the discomfort, he used to take medications for acid reflux but no longer takes them. He states that his diarrhea is watery, multiple episodes per day, no difficulty with urination. He has also had some chest pain, he describes this as the epigastric burning discomfort that gets worse when he lays down. He denies exertional symptoms, has had no significant cough, fever, chills and has had no blood in his stools. There is been no swelling, no blurred vision, no headaches. He also complains of having a constant drooling and thirst but denies any history of diabetes. He does have cirrhosis, COPD. [facility] he was seen today for the same symptoms.  Patient is a 59 y.o. male presenting with chest pain and shortness of breath. The history is provided by the patient.  Chest Pain Associated symptoms: shortness of breath   Shortness of Breath Associated symptoms: chest pain     Past Medical History  Diagnosis Date  . Cirrhosis   . Degenerative joint disease   . Hypertension   . Gastric ulcer   . Depression   . Asthma    . Mitral valve prolapse 2002  . H/O hiatal hernia   . COPD (chronic obstructive pulmonary disease)   . TIA (transient ischemic attack)     2010  . Stroke     TIA - 2010 - no deficits   . Shortness of breath     with exertion   . GERD (gastroesophageal reflux disease)   . Headache(784.0)   . Hiatal hernia 1982  . Degenerative joint disease   . Anxiety   . Alcohol abuse    Past Surgical History  Procedure Laterality Date  . Gastrectomy    . Shoulder surgery Bilateral     3 surgeries on on left, 2 surgeries on right   . Rt knee arthroscopic surgery    . Back surgery      3 cervical spine surgeries C4-C5 fused  . Hernia repair    . Finger surgery Left     2nd, 3rd, & 4th fingers were cut off by table saw and reattached  . Colonoscopy N/A 01/04/2014    Procedure: COLONOSCOPY;  Surgeon: Danie Binder, MD;  [facility];  Service: Endoscopy;  Laterality: N/A;  1:45  . Esophagogastroduodenoscopy N/A 01/04/2014    Procedure: ESOPHAGOGASTRODUODENOSCOPY (EGD);  Surgeon: Danie Binder, MD;  [facility];  Service: Endoscopy;  Laterality: N/A;  . Incisional hernia repair N/A 01/20/2014    Procedure: LAPAROSCOPIC RECURRENT  INCISIONAL HERNIA with mesh;  Surgeon: Marland Kitchen  Edward Jolly, MD;  Location: [facility];  Service: General;  Laterality: N/A;   Family History  Problem Relation Age of Onset  . Cancer Father     bone  . Cancer Brother     lungs  . Stroke Maternal Grandmother   . Colon cancer Neg Hx   . Asthma Son     died at age 38 in his sleep   . Spina bifida Son     died at age 71    History  Substance Use Topics  . Smoking status: Current Every Day Smoker -- 1.00 packs/day for 45 years    Types: Cigarettes    Start date: 07/30/1966  . Smokeless tobacco: Never Used  . Alcohol Use: 3.6 oz/week    6 Cans of beer per week     Comment: 1 case of beer and 1 shot of vodka between 1030am-1430pm    Review of Systems  Respiratory: Positive for shortness of  breath.   Cardiovascular: Positive for chest pain.  All other systems reviewed and are negative.     Allergies  Bee venom and Penicillins  Home Medications   Prior to Admission medications   Medication Sig Start Date End Date Taking? Authorizing Provider  albuterol (PROVENTIL HFA;VENTOLIN HFA) 108 (90 BASE) MCG/ACT inhaler Inhale 2 puffs into the lungs every 6 (six) hours as needed for wheezing or shortness of breath. 03/12/14  Yes Encarnacion Slates, NP  albuterol-ipratropium (COMBIVENT) 18-103 MCG/ACT inhaler Inhale 2 puffs into the lungs 2 (two) times daily. For shortness of breath 09/22/14  Yes Deepak Advani, MD  gabapentin (NEURONTIN) 300 MG capsule Take 1 capsule (300 mg total) by mouth 2 (two) times daily. For substance withdrawal syndrome 08/08/14  Yes Shuvon B Rankin, NP  metoprolol tartrate (LOPRESSOR) 25 MG tablet Take 1 tablet (25 mg total) by mouth 2 (two) times daily. For high blood pressure 09/22/14  Yes Deepak Advani, MD  nicotine (NICODERM CQ - DOSED IN MG/24 HOURS) 21 mg/24hr patch PLACE 1 PATCH ONTO THE SKIN DAILY 11/05/14  Yes Lorayne Marek, MD  trazodone (DESYREL) 150 MG tablet Take 2 tablets (300 mg total) by mouth at bedtime. For sleep 08/08/14  Yes Shuvon B Rankin, NP  acamprosate (CAMPRAL) 333 MG tablet Take 2 tablets (666 mg total) by mouth 3 (three) times daily with meals. For alcoholism Patient not taking: Reported on 12/10/2014 08/08/14   Shuvon B Rankin, NP  amoxicillin-clavulanate (AUGMENTIN) 875-125 MG per tablet Take 1 tablet by mouth every 12 (twelve) hours. Patient not taking: Reported on 12/10/2014 09/05/14   Verlee Monte, MD  aspirin 81 MG chewable tablet Chew 1 tablet (81 mg total) by mouth daily. Patient not taking: Reported on 12/10/2014 09/05/14   Verlee Monte, MD  famotidine (PEPCID) 20 MG tablet Take 1 tablet (20 mg total) by mouth 2 (two) times daily. 12/10/14   [name], MD  FLUoxetine (PROZAC) 40 MG capsule Take 2 capsules (80 mg total) by mouth daily. For  depression Patient not taking: Reported on 12/10/2014 08/08/14   Shuvon B Rankin, NP  pantoprazole (PROTONIX) 40 MG tablet Take 1 tablet (40 mg total) by mouth daily. Patient not taking: Reported on 12/10/2014 09/05/14   Verlee Monte, MD   BP 151/84 mmHg  Pulse 69  Temp(Src) 97.6 F (36.4 C) (Oral)  Resp 10  SpO2 100% Physical Exam  Constitutional: He appears well-developed and well-nourished. No distress.  HENT:  Head: Normocephalic and atraumatic.  Mouth/Throat: Oropharynx is clear and moist. No  oropharyngeal exudate.  Eyes: Conjunctivae and EOM are normal. Pupils are equal, round, and reactive to light. Right eye exhibits no discharge. Left eye exhibits no discharge. No scleral icterus.  Neck: Normal range of motion. Neck supple. No JVD present. No thyromegaly present.  Cardiovascular: Normal rate, regular rhythm, normal heart sounds and intact distal pulses.  Exam reveals no gallop and no friction rub.   No murmur heard. Pulmonary/Chest: Effort normal and breath sounds normal. No respiratory distress. He has no wheezes. He has no rales. He exhibits no tenderness.  Abdominal: Soft. Bowel sounds are normal. He exhibits no distension and no mass. There is tenderness (mild tenderness in the epigastric region, no guarding, no peritoneal signs, no other abdominal tenderness, very soft, no Murphy sign).  Musculoskeletal: Normal range of motion. He exhibits no edema or tenderness.  Lymphadenopathy:    He has no cervical adenopathy.  Neurological: He is alert. Coordination normal.  Skin: Skin is warm and dry. No rash noted. No erythema.  Psychiatric: He has a normal mood and affect. His behavior is normal.  Nursing note and vitals reviewed.   ED Course  Procedures (including critical care time) Labs Review Labs Reviewed  CBC - Abnormal; Notable for the following:    Hemoglobin 12.7 (*)    RDW 17.6 (*)    All other components within normal limits  COMPREHENSIVE METABOLIC PANEL - Abnormal;  Notable for the following:    Sodium 134 (*)    Chloride 96 (*)    Calcium 8.5 (*)    Albumin 3.4 (*)    AST 72 (*)    ALT 65 (*)    All other components within normal limits  BRAIN NATRIURETIC PEPTIDE  LIPASE, BLOOD  I-STAT TROPOININ, ED  I-STAT CG4 LACTIC ACID, ED    Imaging Review Dg Chest 2 View  12/10/2014   CLINICAL DATA:  Chest pain, shortness of breath.  EXAM: CHEST  2 VIEW  COMPARISON:  August 31, 2014.  FINDINGS: The heart size and mediastinal contours are within normal limits. Both lungs are clear. No pneumothorax or pleural effusion is noted. The visualized skeletal structures are unremarkable.  IMPRESSION: No active cardiopulmonary disease.   Electronically Signed   By: Marijo Conception, M.D.   On: 12/10/2014 14:32     EKG Interpretation   Date/Time:  Friday Dec 10 2014 13:18:21 EDT Ventricular Rate:  77 PR Interval:  140 QRS Duration: 86 QT Interval:  382 QTC Calculation: 432 R Axis:   46 Text Interpretation:  Normal sinus rhythm Normal ECG t waves less  prominent that on prior ECG Confirmed by Lamiracle Chaidez  MD, Dalana Pfahler ([id number]) on  12/10/2014 1:34:24 PM      MDM   Final diagnoses:  Gastritis    The patient has vital signs which show mild hypertension, he has abdominal discomfort in the epigastrium which he describes as the chest pain that he has been having, it is positional, appears to be consistent with acid reflux or gastritis, the patient will be given a GI cocktail, labs have been ordered to rule out pancreatitis, cardiac source. His EKG does appear abnormal but compared to the last EKG it is really unchanged in fact the T waves are less prominent. The patient is in agreement with the plan proposed    Labs unremarkable Pt significantly improved after GI cocktail Pt states hasn't had his PPI citing finances - recommended H2 blocker for cost and efficiency Pt in agreement Requests PO meal,  Stable for  d/c.  Meds given in ED:  Medications  sodium  chloride 0.9 % bolus 1,000 mL (1,000 mLs Intravenous New Bag/Given 12/10/14 1358)  ondansetron (ZOFRAN) injection 4 mg (4 mg Intravenous Given 12/10/14 1357)  gi cocktail (Maalox,Lidocaine,Donnatal) (30 mLs Oral Given 12/10/14 1357)  famotidine (PEPCID) IVPB 20 mg premix (0 mg Intravenous Stopped 12/10/14 1528)    New Prescriptions   FAMOTIDINE (PEPCID) 20 MG TABLET    Take 1 tablet (20 mg total) by mouth 2 (two) times daily.      [name], MD 12/10/14 [phone]

## 2014-12-10 NOTE — Progress Notes (Signed)
Patient complains of not feeling well all week Complains of chest pain. SOB upset stomach Decreased appetite and constant drooling Chest pain is in the center of his chest with a rating of #8 on pain scale Patient is being sent to the ED for further evaluation Spoke with Melissa in the ED and they are aware

## 2014-12-10 NOTE — ED Notes (Signed)
Pt transported to xray 

## 2015-01-04 ENCOUNTER — Emergency Department (HOSPITAL_COMMUNITY): Payer: Medicaid Other

## 2015-01-04 ENCOUNTER — Inpatient Hospital Stay (HOSPITAL_COMMUNITY)
Admission: EM | Admit: 2015-01-04 | Discharge: 2015-01-06 | DRG: 392 | Disposition: A | Discharge status: Home / Self Care | Payer: Medicaid Other | Attending: Internal Medicine | Admitting: Internal Medicine

## 2015-01-04 DIAGNOSIS — J45909 Unspecified asthma, uncomplicated: Secondary | ICD-10-CM | POA: Diagnosis present

## 2015-01-04 DIAGNOSIS — K7460 Unspecified cirrhosis of liver: Secondary | ICD-10-CM | POA: Diagnosis present

## 2015-01-04 DIAGNOSIS — Z7982 Long term (current) use of aspirin: Secondary | ICD-10-CM

## 2015-01-04 DIAGNOSIS — K219 Gastro-esophageal reflux disease without esophagitis: Secondary | ICD-10-CM | POA: Diagnosis present

## 2015-01-04 DIAGNOSIS — F1010 Alcohol abuse, uncomplicated: Secondary | ICD-10-CM

## 2015-01-04 DIAGNOSIS — I341 Nonrheumatic mitral (valve) prolapse: Secondary | ICD-10-CM | POA: Diagnosis present

## 2015-01-04 DIAGNOSIS — Y908 Blood alcohol level of 240 mg/100 ml or more: Secondary | ICD-10-CM | POA: Diagnosis present

## 2015-01-04 DIAGNOSIS — Z8673 Personal history of transient ischemic attack (TIA), and cerebral infarction without residual deficits: Secondary | ICD-10-CM

## 2015-01-04 DIAGNOSIS — K2920 Alcoholic gastritis without bleeding: Principal | ICD-10-CM | POA: Diagnosis present

## 2015-01-04 DIAGNOSIS — F1020 Alcohol dependence, uncomplicated: Secondary | ICD-10-CM | POA: Diagnosis present

## 2015-01-04 DIAGNOSIS — J449 Chronic obstructive pulmonary disease, unspecified: Secondary | ICD-10-CM | POA: Diagnosis present

## 2015-01-04 DIAGNOSIS — F17210 Nicotine dependence, cigarettes, uncomplicated: Secondary | ICD-10-CM | POA: Diagnosis present

## 2015-01-04 DIAGNOSIS — I10 Essential (primary) hypertension: Secondary | ICD-10-CM | POA: Diagnosis present

## 2015-01-04 DIAGNOSIS — F329 Major depressive disorder, single episode, unspecified: Secondary | ICD-10-CM | POA: Diagnosis present

## 2015-01-04 DIAGNOSIS — R45851 Suicidal ideations: Secondary | ICD-10-CM | POA: Diagnosis present

## 2015-01-04 DIAGNOSIS — F419 Anxiety disorder, unspecified: Secondary | ICD-10-CM | POA: Diagnosis present

## 2015-01-04 DIAGNOSIS — K2970 Gastritis, unspecified, without bleeding: Secondary | ICD-10-CM

## 2015-01-04 DIAGNOSIS — G4700 Insomnia, unspecified: Secondary | ICD-10-CM | POA: Diagnosis present

## 2015-01-04 DIAGNOSIS — R0789 Other chest pain: Secondary | ICD-10-CM

## 2015-01-04 DIAGNOSIS — M1990 Unspecified osteoarthritis, unspecified site: Secondary | ICD-10-CM | POA: Diagnosis present

## 2015-01-04 LAB — BASIC METABOLIC PANEL
ANION GAP: 19 — AB (ref 5–15)
CHLORIDE: 83 mmol/L — AB (ref 101–111)
CO2: 24 mmol/L (ref 22–32)
CREATININE: 0.81 mg/dL (ref 0.61–1.24)
Calcium: 8.2 mg/dL — ABNORMAL LOW (ref 8.9–10.3)
GFR calc Af Amer: >60 mL/min (ref >60)
GFR calc non Af Amer: >60 mL/min (ref >60)
Glucose, Bld: 96 mg/dL (ref 65–99)
POTASSIUM: 3.3 mmol/L — AB (ref 3.5–5.1)
SODIUM: 126 mmol/L — AB (ref 135–145)

## 2015-01-04 LAB — TROPONIN I: Troponin I: <0.03 ng/mL (ref <0.031)

## 2015-01-04 LAB — ETHANOL: ALCOHOL ETHYL (B): 315 mg/dL — AB (ref <5)

## 2015-01-04 LAB — HEPATIC FUNCTION PANEL
ALT: 43 U/L (ref 17–63)
AST: 95 U/L — ABNORMAL HIGH (ref 15–41)
Albumin: 3.4 g/dL — ABNORMAL LOW (ref 3.5–5.0)
Alkaline Phosphatase: 66 U/L (ref 38–126)
Bilirubin, Direct: 0.1 mg/dL (ref 0.1–0.5)
Indirect Bilirubin: 0.4 mg/dL (ref 0.3–0.9)
TOTAL PROTEIN: 6.5 g/dL (ref 6.5–8.1)
Total Bilirubin: 0.5 mg/dL (ref 0.3–1.2)

## 2015-01-04 LAB — RAPID URINE DRUG SCREEN, HOSP PERFORMED
AMPHETAMINES: NOT DETECTED
Barbiturates: NOT DETECTED
Benzodiazepines: NOT DETECTED
Cocaine: NOT DETECTED
OPIATES: NOT DETECTED
Tetrahydrocannabinol: NOT DETECTED

## 2015-01-04 LAB — CBC
HCT: 40.5 % (ref 39.0–52.0)
HEMOGLOBIN: 14.2 g/dL (ref 13.0–17.0)
MCH: 28.9 pg (ref 26.0–34.0)
MCHC: 35.1 g/dL (ref 30.0–36.0)
MCV: 82.5 fL (ref 78.0–100.0)
Platelets: 282 10*3/uL (ref 150–400)
RBC: 4.91 MIL/uL (ref 4.22–5.81)
RDW: 18.4 % — ABNORMAL HIGH (ref 11.5–15.5)
WBC: 11.2 10*3/uL — AB (ref 4.0–10.5)

## 2015-01-04 LAB — I-STAT TROPONIN, ED: TROPONIN I, POC: 0 ng/mL (ref 0.00–0.08)

## 2015-01-04 LAB — URINALYSIS, ROUTINE W REFLEX MICROSCOPIC
BILIRUBIN URINE: NEGATIVE
GLUCOSE, UA: NEGATIVE mg/dL
HGB URINE DIPSTICK: NEGATIVE
Ketones, ur: 15 mg/dL — AB
Leukocytes, UA: NEGATIVE
Nitrite: NEGATIVE
PH: 5 (ref 5.0–8.0)
PROTEIN: NEGATIVE mg/dL
SPECIFIC GRAVITY, URINE: 1.004 — AB (ref 1.005–1.030)
Urobilinogen, UA: 0.2 mg/dL (ref 0.0–1.0)

## 2015-01-04 LAB — BRAIN NATRIURETIC PEPTIDE: B NATRIURETIC PEPTIDE 5: 20.9 pg/mL (ref 0.0–100.0)

## 2015-01-04 LAB — LIPASE, BLOOD: LIPASE: 35 U/L (ref 22–51)

## 2015-01-04 MED ORDER — IPRATROPIUM-ALBUTEROL 0.5-2.5 (3) MG/3ML IN SOLN: 3.0000 mL | RESPIRATORY_TRACT | Status: DC | PRN

## 2015-01-04 MED ORDER — VITAMIN B-1 100 MG PO TABS
100.0000 mg | ORAL_TABLET | Freq: Every day | ORAL | Status: DC
  Administered 2015-01-04: 100 mg via ORAL
  Filled 2015-01-04: qty 1

## 2015-01-04 MED ORDER — NICOTINE 14 MG/24HR TD PT24
14.0000 mg | MEDICATED_PATCH | Freq: Once | TRANSDERMAL | Status: DC
  Administered 2015-01-04: 14 mg via TRANSDERMAL
  Filled 2015-01-04: qty 1

## 2015-01-04 MED ORDER — GI COCKTAIL ~~LOC~~
30.0000 mL | Freq: Once | ORAL | Status: AC
  Administered 2015-01-04: 30 mL via ORAL
  Filled 2015-01-04: qty 30

## 2015-01-04 MED ORDER — GI COCKTAIL ~~LOC~~: 30.0000 mL | Freq: Once | ORAL | Status: DC

## 2015-01-04 MED ORDER — LORAZEPAM 1 MG PO TABS: 0.0000 mg | ORAL_TABLET | Freq: Two times a day (BID) | ORAL | Status: DC

## 2015-01-04 MED ORDER — LORAZEPAM 1 MG PO TABS: 0.0000 mg | ORAL_TABLET | Freq: Four times a day (QID) | ORAL | Status: DC

## 2015-01-04 MED ORDER — THIAMINE HCL 100 MG/ML IJ SOLN: 100.0000 mg | Freq: Every day | INTRAMUSCULAR | Status: DC

## 2015-01-04 MED ORDER — TRAZODONE HCL 100 MG PO TABS
300.0000 mg | ORAL_TABLET | Freq: Every day | ORAL | Status: DC
  Administered 2015-01-04: 300 mg via ORAL
  Filled 2015-01-04: qty 3

## 2015-01-04 MED ORDER — PANTOPRAZOLE SODIUM 40 MG IV SOLR
40.0000 mg | INTRAVENOUS | Status: DC
  Administered 2015-01-04: 40 mg via INTRAVENOUS
  Filled 2015-01-04: qty 40

## 2015-01-04 MED ORDER — POTASSIUM CHLORIDE CRYS ER 20 MEQ PO TBCR
40.0000 meq | EXTENDED_RELEASE_TABLET | Freq: Once | ORAL | Status: AC
Start: 2015-01-04 — End: 2015-01-04
  Administered 2015-01-04: 40 meq via ORAL
  Filled 2015-01-04: qty 2

## 2015-01-04 MED ORDER — ONDANSETRON HCL 4 MG PO TABS: 4.0000 mg | ORAL_TABLET | Freq: Three times a day (TID) | ORAL | Status: DC | PRN

## 2015-01-04 MED ORDER — SODIUM CHLORIDE 0.9 % IV SOLN
Freq: Once | INTRAVENOUS | Status: AC
  Administered 2015-01-04: 16:00:00 via INTRAVENOUS

## 2015-01-04 NOTE — ED Notes (Signed)
Per eMS: pt from home for eval of sharp substernal cp that started 2 hours ago, pt also reports sob and nausea, given 4mg  of zofran 324 mg of aspirin and 1 nitro with relief. EMs noted wheezing to pt lung fields, started 5mg  of albuterol breathing treatment, pt states some chest pain relief with this. Pt also reports increase in pain with inspiration. nad noted. Pt does report ETOH intake today.

## 2015-01-04 NOTE — BH Assessment (Addendum)
Tele - Assessment Note  Samuel Reyes is a 59 year old white male that reports SI with a plan to drink himself to death.  Upon admission to the ED the patient BAL was 315.  Patient reports that he is not able to contract for safety.  Patient reports SI due to the death of his wife and two sons.  Patient reports his first son died when he was 89 years old.  His wife died 21 years ago and his other son died 3 years ago.  Patient reports attempting suicide by cutting his wrist when his first son died.    Patient reports that he has been drinking 5-6 (40oz) of beer daily for the past couple of years.  Patient reports that his last drink was today when he drank 2 (40oz) ofr beer before coming the the ED.  Patient denies any substance abuse.   Patient denies withdrawl symptoms at this point in time.  Patient denies a histoty of seizures.    Patient reports that he has also been out of his Lexapro for one month.   Patient reports that he was receiving mental health medication management services at [facility].  Patient denies physical, sexual or emotional.  Patient denies HI and Psychosis.  Patient reports that he has been living with two roomates for the past two months.  Patient reports prior to having these roomates he was homeless.    Axis I: Major Depression, Recurrent severe Alcohol Abuse   Axis II: Deferred Axis III:  Past Medical History  Diagnosis Date  . Cirrhosis   . Degenerative joint disease   . Hypertension   . Gastric ulcer   . Depression   . Asthma   . Mitral valve prolapse 2002  . H/O hiatal hernia   . COPD (chronic obstructive pulmonary disease)   . TIA (transient ischemic attack)     2010  . Stroke     TIA - 2010 - no deficits   . Shortness of breath     with exertion   . GERD (gastroesophageal reflux disease)   . Headache(784.0)   . Hiatal hernia 1982  . Degenerative joint disease   . Anxiety   . Alcohol abuse    Axis IV: economic problems, housing problems,  occupational problems, other psychosocial or environmental problems, problems related to social environment, problems with access to health care services and problems with primary support group Axis V: 31-40 impairment in reality testing  Past Medical History:  Past Medical History  Diagnosis Date  . Cirrhosis   . Degenerative joint disease   . Hypertension   . Gastric ulcer   . Depression   . Asthma   . Mitral valve prolapse 2002  . H/O hiatal hernia   . COPD (chronic obstructive pulmonary disease)   . TIA (transient ischemic attack)     2010  . Stroke     TIA - 2010 - no deficits   . Shortness of breath     with exertion   . GERD (gastroesophageal reflux disease)   . Headache(784.0)   . Hiatal hernia 1982  . Degenerative joint disease   . Anxiety   . Alcohol abuse     Past Surgical History  Procedure Laterality Date  . Gastrectomy    . Shoulder surgery Bilateral     3 surgeries on on left, 2 surgeries on right   . Rt knee arthroscopic surgery    . Back surgery  3 cervical spine surgeries C4-C5 fused  . Hernia repair    . Finger surgery Left     2nd, 3rd, & 4th fingers were cut off by table saw and reattached  . Colonoscopy N/A 01/04/2014    Procedure: COLONOSCOPY;  Surgeon: Danie Binder, MD;  [facility];  Service: Endoscopy;  Laterality: N/A;  1:45  . Esophagogastroduodenoscopy N/A 01/04/2014    Procedure: ESOPHAGOGASTRODUODENOSCOPY (EGD);  Surgeon: Danie Binder, MD;  [facility];  Service: Endoscopy;  Laterality: N/A;  . Incisional hernia repair N/A 01/20/2014    Procedure: LAPAROSCOPIC RECURRENT  INCISIONAL HERNIA with mesh;  Surgeon: Edward Jolly, MD;  Location: [facility];  Service: General;  Laterality: N/A;    Family History:  Family History  Problem Relation Age of Onset  . Cancer Father     bone  . Cancer Brother     lungs  . Stroke Maternal Grandmother   . Colon cancer Neg Hx   . Asthma Son     died at age 90 in his  sleep   . Spina bifida Son     died at age 36     Social History:  reports that he has been smoking Cigarettes.  He started smoking about 48 years ago. He has a 45 pack-year smoking history. He has never used smokeless tobacco. He reports that he drinks about 3.6 oz of alcohol per week. He reports that he does not use illicit drugs.  Additional Social History:  Alcohol / Drug Use History of alcohol / drug use?: Yes Negative Consequences of Use: Financial, Personal relationships, Work / School Substance #1 Name of Substance 1: Alcohol 1 - Age of First Use: 59 years old 13 - Amount (size/oz): 5 or 6 (40oz) 1 - Frequency: Daily 1 - Duration: Years, - Reports that he does not remember how long it has been 1 - Last Use / Amount: Today 2 (40oz) of beer before coming to the ED  CIWA: CIWA-Ar BP: 114/70 mmHg Pulse Rate: 99 COWS:    Allergies:  Allergies  Allergen Reactions  . Bee Venom Anaphylaxis  . Penicillins Rash    Home Medications:  (Not in a hospital admission)  OB/GYN Status:  No LMP for male patient.  General Assessment Data Location of Assessment: [facility] ED TTS Assessment: In system Is this a Tele or Face-to-Face Assessment?: Tele Assessment Is this an Initial Assessment or a Re-assessment for this encounter?: Initial Assessment Marital status: Widowed Newsoms name: NA Is patient pregnant?: No Pregnancy Status: No Living Arrangements: Other (Comment) (Lives with 2 room mates for the past 2 months.) Can pt return to current living arrangement?: Yes Admission Status: Voluntary Is patient capable of signing voluntary admission?: Yes Referral Source: Self/Family/Friend Insurance type: [facility] Medicaid  Medical Screening Exam ([place]) Medical Exam completed: Yes  Crisis Care Plan Living Arrangements: Other (Comment) (Lives with 2 room mates for the past 2 months.) Name of Psychiatrist: Warden/ranger Name of Therapist: Warden/ranger   Education Status Is patient  currently in school?: No Current Grade: NA Highest grade of school patient has completed: NA Name of school: NA Contact person: NA  Risk to self with the past 6 months Suicidal Ideation: Yes-Currently Present Has patient been a risk to self within the past 6 months prior to admission? : Yes Suicidal Intent: Yes-Currently Present Has patient had any suicidal intent within the past 6 months prior to admission? : Yes Is patient at risk for suicide?:  Yes Suicidal Plan?: Yes-Currently Present Has patient had any suicidal plan within the past 6 months prior to admission? : Yes Specify Current Suicidal Plan: Drink himself to death. Access to Means: Yes Specify Access to Suicidal Means: Beer What has been your use of drugs/alcohol within the last 12 months?: Beer Previous Attempts/Gestures: Yes How many times?: 1 Other Self Harm Risks: NA Triggers for Past Attempts: Other (Comment) (His first son died.) Intentional Self Injurious Behavior: None Family Suicide History: No Recent stressful life event(s): Financial Problems Persecutory voices/beliefs?: No Depression: Yes Depression Symptoms: Despondent, Insomnia, Isolating, Fatigue, Guilt, Loss of interest in usual pleasures, Feeling worthless/self pity Substance abuse history and/or treatment for substance abuse?: Yes (Alcohol BAL is 315) Suicide prevention information given to non-admitted patients: Yes  Risk to Others within the past 6 months Homicidal Ideation: No Does patient have any lifetime risk of violence toward others beyond the six months prior to admission? : No Thoughts of Harm to Others: No Current Homicidal Intent: No Current Homicidal Plan: No Access to Homicidal Means: No Identified Victim: NA History of harm to others?: No Assessment of Violence: None Noted Violent Behavior Description: NA Does patient have access to weapons?: No Criminal Charges Pending?: No Does patient have a court date: No Is patient on  probation?: No  Psychosis Hallucinations: None noted Delusions: None noted  Mental Status Report Appearance/Hygiene: Disheveled Eye Contact: Fair Motor Activity: Freedom of movement Speech: Logical/coherent Level of Consciousness: Alert Mood: Depressed, Anxious, Helpless, Worthless, low self-esteem Affect: Anxious, Depressed, Sullen, Flat Anxiety Level: Minimal Thought Processes: Coherent, Relevant Judgement: Unimpaired Orientation: Place, Person, Time, Situation Obsessive Compulsive Thoughts/Behaviors: None  Cognitive Functioning Concentration: Decreased Memory: Recent Intact, Remote Intact IQ: Average Insight: Fair Impulse Control: Poor Appetite: Fair Weight Loss: 0 Weight Gain: 0 Sleep: Decreased Total Hours of Sleep: 3 Vegetative Symptoms: Decreased grooming  ADLScreening [facility] Assessment Services) Patient's cognitive ability adequate to safely complete daily activities?: Yes Patient able to express need for assistance with ADLs?: Yes Independently performs ADLs?: Yes (appropriate for developmental age)  Prior Inpatient Therapy Prior Inpatient Therapy: Yes Prior Therapy Dates: 2014 Prior Therapy Facilty/Provider(s): [facility] Reason for Treatment: SI  Prior Outpatient Therapy Prior Outpatient Therapy: Yes Prior Therapy Dates: 3 months ago Prior Therapy Facilty/Provider(s): [facility]  Reason for Treatment: Medication Management  Does patient have an ACCT team?: No Does patient have Intensive In-House Services?  : No Does patient have [facility] services? : Yes (He has not been in 3 months.) Does patient have [facility] services?: No  ADL Screening (condition at time of admission) Patient's cognitive ability adequate to safely complete daily activities?: Yes Is the patient deaf or have difficulty hearing?: No Does the patient have difficulty seeing, even when wearing glasses/contacts?: No Does the patient have difficulty concentrating, remembering, or making decisions?:  No Patient able to express need for assistance with ADLs?: Yes Does the patient have difficulty dressing or bathing?: No Independently performs ADLs?: Yes (appropriate for developmental age) Does the patient have difficulty walking or climbing stairs?: Yes Weakness of Legs: Both Weakness of Arms/Hands: None  Home Assistive Devices/Equipment Home Assistive Devices/Equipment: None    Abuse/Neglect Assessment (Assessment to be complete while patient is alone) Physical Abuse: Denies Verbal Abuse: Denies Sexual Abuse: Denies Exploitation of patient/patient's resources: Denies Self-Neglect: Denies Values / Beliefs Cultural Requests During Hospitalization: None Spiritual Requests During Hospitalization: None Consults Spiritual Care Consult Needed: No Social Work Consult Needed: No Regulatory affairs officer (For Healthcare) Does patient have an advance directive?: No Would patient  like information on creating an advanced directive?: No - patient declined information    Additional Information 1:1 In Past 12 Months?: No CIRT Risk: No Elopement Risk: No Does patient have medical clearance?: Yes     Disposition: Per Aggie, NP - patient meets criteria for inpatient hospitalization.  Disposition Initial Assessment Completed for this Encounter: Yes Disposition of Patient: Inpatient treatment program (Per Aggie, NP - patient meets criteria for inpat hosp) Type of inpatient treatment program: Adult  On Site Evaluation by:   Reviewed with Physician:    Graciella Freer [place] 01/04/2015 5:19 PM

## 2015-01-04 NOTE — ED Notes (Signed)
Pt states he is also having abd pain and n/v. Pt states he has drank 4 40 oz beers today and does this everyday. Pt states he has been having n/v for "days" and he hasn't had anything to eat.

## 2015-01-04 NOTE — ED Provider Notes (Signed)
CSN: [id number]     Arrival date & time 01/04/15  1420 History   First MD Initiated Contact with Patient 01/04/15 1506     Chief Complaint  Patient presents with  . Chest Pain  . Suicidal   HPI  Samuel Reyes is a 59 yo M with PMHx of alcohol abuse, tobacco abuse, COPD, gastric ulcers, and GERD who presents with complaint of chest pain and shortness of breath. Patient states he had sudden onset of left superior chest pain and epigastric pain around 5 am this morning. He describes the pain as burning, constant, 8/10, radiating to left arm, associated with nausea and 3 episodes of vomiting. Pain is worse with palpation and exertion. Nothing seems to make it better. Patient does admit to history of GERD, alcohol abuse, hiatal hernia and history of gastric ulcers. He admits to drinking every day for the past month- 4-5 40 oz beers a day. He admits to a history of MVP, but denies history of CAD, MI, CHF. His last stress test was 12 years ago in [place].   Patient states he has increased shortness of breath, wheezing, and productive cough of green-yellow sputum and subjective fevers for one week. He uses his albuterol and Combivent at home. Shortness of breath is worse with exertion, better with rest, but occurs even at rest. Patient is not on oxygen at home. He reports minimal improvement with the albuterol nebulizer he received via EMS.   Patient admits to increased depression due to passing of wife and sons. He has been trying to "drink himself to death." He has also been out of his Lexapro for one month. He is interested in talking with someone about his depression. He does not have a plan to kill himself other than alcohol abuse.   Past Medical History  Diagnosis Date  . Cirrhosis   . Degenerative joint disease   . Hypertension   . Gastric ulcer   . Depression   . Asthma   . Mitral valve prolapse 2002  . H/O hiatal hernia   . COPD (chronic obstructive pulmonary disease)   . TIA (transient ischemic  attack)     2010  . Stroke     TIA - 2010 - no deficits   . Shortness of breath     with exertion   . GERD (gastroesophageal reflux disease)   . Headache(784.0)   . Hiatal hernia 1982  . Degenerative joint disease   . Anxiety   . Alcohol abuse    Past Surgical History  Procedure Laterality Date  . Gastrectomy    . Shoulder surgery Bilateral     3 surgeries on on left, 2 surgeries on right   . Rt knee arthroscopic surgery    . Back surgery      3 cervical spine surgeries C4-C5 fused  . Hernia repair    . Finger surgery Left     2nd, 3rd, & 4th fingers were cut off by table saw and reattached  . Colonoscopy N/A 01/04/2014    Procedure: COLONOSCOPY;  Surgeon: Danie Binder, MD;  Location: [facility];  Service: Endoscopy;  Laterality: N/A;  1:45  . Esophagogastroduodenoscopy N/A 01/04/2014    Procedure: ESOPHAGOGASTRODUODENOSCOPY (EGD);  Surgeon: Danie Binder, MD;  Location: [facility];  Service: Endoscopy;  Laterality: N/A;  . Incisional hernia repair N/A 01/20/2014    Procedure: LAPAROSCOPIC RECURRENT  INCISIONAL HERNIA with mesh;  Surgeon: Edward Jolly, MD;  Location: [facility];  Service:  General;  Laterality: N/A;   Family History  Problem Relation Age of Onset  . Cancer Father     bone  . Cancer Brother     lungs  . Stroke Maternal Grandmother   . Colon cancer Neg Hx   . Asthma Son     died at age 43 in his sleep   . Spina bifida Son     died at age 9    History  Substance Use Topics  . Smoking status: Current Every Day Smoker -- 1.00 packs/day for 45 years    Types: Cigarettes    Start date: 07/30/1966  . Smokeless tobacco: Never Used  . Alcohol Use: 3.6 oz/week    6 Cans of beer per week     Comment: 1 case of beer and 1 shot of vodka between 1030am-1430pm    Review of Systems General: Admits to fever, chills, and diaphoresis.  Respiratory: Admits to SOB, productive cough, DOE, chest tightness, and wheezing.   Cardiovascular: Admits to chest pain,  but denies palpitations.  Gastrointestinal: Admits to nausea, and non-bilious, non-bloody vomiting, epigastric pain, and diarrhea. He denies constipation, blood in stool and abdominal distention.  Genitourinary: Denies dysuria, urgency Musculoskeletal: Admits to chronic pain mostly in back and shoulders, knees.  Skin: Denies rash and wounds.  Neurological: Denies dizziness, headaches, weakness, lightheadedness Psychiatric/Behavioral: Admits to depression and suicidal ideation   Allergies  Bee venom and Penicillins  Home Medications   Prior to Admission medications   Medication Sig Start Date End Date Taking? Authorizing Provider  albuterol (PROVENTIL HFA;VENTOLIN HFA) 108 (90 BASE) MCG/ACT inhaler Inhale 2 puffs into the lungs every 6 (six) hours as needed for wheezing or shortness of breath. 03/12/14  Yes Encarnacion Slates, NP  albuterol-ipratropium (COMBIVENT) 18-103 MCG/ACT inhaler Inhale 2 puffs into the lungs 2 (two) times daily. For shortness of breath 09/22/14  Yes Lorayne Marek, MD  Aspirin-Acetaminophen (GOODYS BODY PAIN PO) Take 2 packets by mouth daily as needed (for pain).   Yes Historical Provider, MD  famotidine (PEPCID) 20 MG tablet Take 1 tablet (20 mg total) by mouth 2 (two) times daily. 12/10/14  Yes [name], MD  gabapentin (NEURONTIN) 300 MG capsule Take 1 capsule (300 mg total) by mouth 2 (two) times daily. For substance withdrawal syndrome 08/08/14  Yes Shuvon B Rankin, NP  metoprolol tartrate (LOPRESSOR) 25 MG tablet Take 1 tablet (25 mg total) by mouth 2 (two) times daily. For high blood pressure 09/22/14  Yes Deepak Advani, MD  trazodone (DESYREL) 150 MG tablet Take 2 tablets (300 mg total) by mouth at bedtime. For sleep 08/08/14  Yes Shuvon B Rankin, NP  acamprosate (CAMPRAL) 333 MG tablet Take 2 tablets (666 mg total) by mouth 3 (three) times daily with meals. For alcoholism Patient not taking: Reported on 12/10/2014 08/08/14   Shuvon B Rankin, NP  aspirin 81 MG chewable  tablet Chew 1 tablet (81 mg total) by mouth daily. Patient not taking: Reported on 12/10/2014 09/05/14   Verlee Monte, MD  FLUoxetine (PROZAC) 40 MG capsule Take 2 capsules (80 mg total) by mouth daily. For depression Patient not taking: Reported on 12/10/2014 08/08/14   Shuvon B Rankin, NP  nicotine (NICODERM CQ - DOSED IN MG/24 HOURS) 21 mg/24hr patch PLACE 1 PATCH ONTO THE SKIN DAILY Patient not taking: Reported on 01/04/2015 11/05/14   Lorayne Marek, MD  pantoprazole (PROTONIX) 40 MG tablet Take 1 tablet (40 mg total) by mouth daily. Patient not taking: Reported on  12/10/2014 09/05/14   Verlee Monte, MD   Physical Exam  Filed Vitals:   01/04/15 1800 01/04/15 2004 01/04/15 2007 01/04/15 2015  BP: 131/77 134/72  129/65  Pulse: 99 103  102  Temp:  98.7 F (37.1 C)    TempSrc:  Oral    Resp: 14 19  17   SpO2: 95% 91% 92% 90%   General: Vital signs reviewed.  Patient is well-developed and well-nourished, in no acute distress and cooperative with exam.  Cardiovascular: Tachycardic, regular rhythm, S1 normal, S2 normal. Chest pain is reproducible upon palpation of left side of chest and sternum.  Pulmonary/Chest: Mild end-expiratory wheezes bilaterally. No rales, or rhonchi. Moving good air. Abdominal: Soft, tender to palpation in the epigastric region, non-distended, BS +, no guarding present.  Extremities: No lower extremity edema bilaterally, pulses symmetric Neurological: A&O x3,  cranial nerve II-XII are grossly intact  Skin: Warm, dry and intact. No rashes or erythema. Psychiatric: Depressed mood and affect. Speech and behavior is normal. Cognition and memory are normal.   ED Course  Procedures (including critical care time) Labs Review Labs Reviewed  CBC - Abnormal; Notable for the following:    WBC 11.2 (*)    RDW 18.4 (*)    All other components within normal limits  BASIC METABOLIC PANEL - Abnormal; Notable for the following:    Sodium 126 (*)    Potassium 3.3 (*)    Chloride 83 (*)     BUN <5 (*)    Calcium 8.2 (*)    Anion gap 19 (*)    All other components within normal limits  HEPATIC FUNCTION PANEL - Abnormal; Notable for the following:    Albumin 3.4 (*)    AST 95 (*)    All other components within normal limits  ETHANOL - Abnormal; Notable for the following:    Alcohol, Ethyl (B) 315 (*)    All other components within normal limits  URINALYSIS, ROUTINE W REFLEX MICROSCOPIC (NOT AT [facility]) - Abnormal; Notable for the following:    Specific Gravity, Urine 1.004 (*)    Ketones, ur 15 (*)    All other components within normal limits  BRAIN NATRIURETIC PEPTIDE  LIPASE, BLOOD  TROPONIN I  URINE RAPID DRUG SCREEN (HOSP PERFORMED) NOT AT [facility]  TROPONIN I  TROPONIN I  Randolm Idol, ED    Imaging Review Dg Chest 2 View  01/04/2015   CLINICAL DATA:  Chest pain. Initial encounter. Sharp substernal chest pain onset of symptoms 2 hr ago. Short of breath and nausea.  EXAM: CHEST  2 VIEW  COMPARISON:  12/10/2014.  FINDINGS: Cardiopericardial silhouette within normal limits. Mediastinal contours normal. Trachea midline. No airspace disease or effusion. Surgical clips in the upper abdomen. Truncation of the distal LEFT clavicle, likely postsurgical.  IMPRESSION: No active cardiopulmonary disease.   Electronically Signed   By: Dereck Ligas M.D.   On: 01/04/2015 15:20   Dg Abd Acute W/chest  01/04/2015   CLINICAL DATA:  Mid chest pain.  Symptoms for 1 week.  EXAM: DG ABDOMEN ACUTE W/ 1V CHEST  COMPARISON:  Plain films earlier today.  FINDINGS: Scattered air-fluid levels throughout small and large bowel without definite obstruction. Increased number of visible small bowel loops is also abnormal. No free air is observed. Surgical clips surround the GE junction. Radiodense material in LEFT upper quadrant likely ingested within the stomach. Heart size and mediastinal contours are within normal limits. Both lungs are clear.  IMPRESSION: Suspected ileus. No acute  cardiopulmonary  disease. Stable appearance of the chest from priors.   Electronically Signed   By: Rolla Flatten M.D.   On: 01/04/2015 20:01     EKG Interpretation   Date/Time:  Tuesday January 04 2015 14:32:30 EDT Ventricular Rate:  103 PR Interval:  131 QRS Duration: 103 QT Interval:  366 QTC Calculation: 479 R Axis:   70 Text Interpretation:  Sinus tachycardia Abnormal R-wave progression, early  transition Borderline ST elevation, anterior leads Borderline prolonged QT  interval No significant change since last tracing Confirmed by Kathrynn Humble,  MD, Thelma Comp [phone]) on 01/04/2015 3:56:28 PM      MDM   Final diagnoses:  None   Samuel Reyes is a 59 yo male with PMHx of GERD, CVA, HTN, COPD, Asthma, Gastric Ulcers, alcohol abuse, and suicidal ideation presenting with chest pain, epigastric pain and shortness of breath.   1. Chest Pain: Burning, constant left sided chest pain radiating to left arm for almost 12 hours. Reproducible with palpation, slightly relieved with nitroglycerin. Given ASA 325 mg po by EMS. Heart score of 3 for age, and risk factors of HLD, HTN, cigarette smoking. POC troponin negative. EKG shows sinus tachycardia and borderline ST elevation in anterior leads. Patient also admits to exertional dyspnea increasing over the last week. Repeat poc troponin negative. Will continue to trend. Given chest pain with atypical and typical features, risk factors, and persistence of pain, will consult Cardiology. Patient was discussed with Dr. Radford Pax with Cardiology given patients exertional dyspnea and chest pain. Dr. Radford Pax recommended medicine admission with further cardiac work up. Cardiology will follow along.  2. Epigastric Pain: Likely gastritis/esophagitis given past medical history and alcohol abuse. Of note, patient was recently admitted one month ago for similar symptoms, diagnosed with gastritis, and discharged with Pepcid prescription. EGD in 6/15 revealed non-erosive gastritis, possible Barrett's  esophagus and hiatal hernia. BMET reveals anion gap of 19, hypokalemia of 3.3, and hyponatremia of 126, likely secondary to alcohol intake/beer potomania. Ethanol level of 315. Lipase normal at 35. AST elevated at 95, ALT normal at 43. Began treatment with NS 100 cc/hr. Will try GI cocktail. Replaced potassium with KDur 40 mEq. Ordered abdominal xray given tenderness to rule out free air. Abd xray shows no free air, but does reveal possible ileus. UA normal. Scheduled protonix 40 mg IV QD.   3. Shortness of breath: Likely secondary to COPD versus asthma exacerbation. Present and worsening for one week, h/o COPD and asthma. Admits to productive cough of green-yellow sputum, fever, chills, wheezing, increased cough. Patient is satting 92-96% on room air. Mild leukocytosis of 11.2. BNP normal at 20.9. CXR shows no active cardiopulmonary disease. Will give Duoneb treatment. Resolution of wheezing with duoneb treatment. Pulse ox with ambulation 99% throughout.   4. Suicidal Ideation: Patient admits to trying to drink himself to death with 4-5 40 oz beers a day. Patient does admit to increased depression and being out of his Prozac for one month. UDS normal. I have placed patient on CIWA protocol. Patient is on suicide precautions.  Case was discussed in full with ED Attending Physician, Dr. Kathrynn Humble.  Osa Craver, DO PGY-1 Internal Medicine Resident Pager # [phone] 01/04/2015 9:19 PM    Roxine Caddy Sherral Hammers, MD 01/04/15 2120  Varney Biles, MD 01/06/15 1526

## 2015-01-04 NOTE — BH Specialist Note (Signed)
Per Jeremy Johann, NP - patient meets criteria for inpatient hospitalization.

## 2015-01-04 NOTE — ED Notes (Signed)
Pt states he no longer wants to live.

## 2015-01-04 NOTE — ED Notes (Signed)
Pt ambulates with no issues. Pt O2 sat remains 99% throughout.

## 2015-01-04 NOTE — Progress Notes (Addendum)
Patient has been referred for IP treatment to: [facility] - per Nunzio Cory, fax referral. [facility] - per Fraser Din, fax referral for review. [facility] - per Shanon Brow, a few geri beds open, fax referral. [facility] per Gerald Stabs, can fax referral, will take a look at pt. [facility] - per Lattie Haw, fax referral over. No beds right now, will take a look at it after midnight. [facility] - per Caryl Pina, fax referral for waitlist.   At capacity: [facility] - per Overly  CSW will continue to seek placement.  Verlon Setting, [facility] Disposition staff 01/04/2015 9:18 PM

## 2015-01-04 NOTE — ED Notes (Addendum)
Pt states he doesn't want to live anymore. Will not state he wants to harm himself, but continues to state he no longer wants to live. Staffing called and sitter is en route.

## 2015-01-05 ENCOUNTER — Ambulatory Visit (HOSPITAL_COMMUNITY): Payer: Medicaid Other

## 2015-01-05 DIAGNOSIS — J449 Chronic obstructive pulmonary disease, unspecified: Secondary | ICD-10-CM | POA: Diagnosis present

## 2015-01-05 DIAGNOSIS — F17210 Nicotine dependence, cigarettes, uncomplicated: Secondary | ICD-10-CM | POA: Diagnosis present

## 2015-01-05 DIAGNOSIS — R45851 Suicidal ideations: Secondary | ICD-10-CM | POA: Diagnosis not present

## 2015-01-05 DIAGNOSIS — R079 Chest pain, unspecified: Secondary | ICD-10-CM

## 2015-01-05 DIAGNOSIS — Y908 Blood alcohol level of 240 mg/100 ml or more: Secondary | ICD-10-CM | POA: Diagnosis present

## 2015-01-05 DIAGNOSIS — K210 Gastro-esophageal reflux disease with esophagitis: Secondary | ICD-10-CM | POA: Diagnosis not present

## 2015-01-05 DIAGNOSIS — K2920 Alcoholic gastritis without bleeding: Secondary | ICD-10-CM | POA: Diagnosis present

## 2015-01-05 DIAGNOSIS — M1990 Unspecified osteoarthritis, unspecified site: Secondary | ICD-10-CM | POA: Diagnosis present

## 2015-01-05 DIAGNOSIS — F1994 Other psychoactive substance use, unspecified with psychoactive substance-induced mood disorder: Secondary | ICD-10-CM

## 2015-01-05 DIAGNOSIS — Z7982 Long term (current) use of aspirin: Secondary | ICD-10-CM | POA: Diagnosis not present

## 2015-01-05 DIAGNOSIS — F419 Anxiety disorder, unspecified: Secondary | ICD-10-CM | POA: Diagnosis present

## 2015-01-05 DIAGNOSIS — K219 Gastro-esophageal reflux disease without esophagitis: Secondary | ICD-10-CM | POA: Diagnosis present

## 2015-01-05 DIAGNOSIS — I10 Essential (primary) hypertension: Secondary | ICD-10-CM

## 2015-01-05 DIAGNOSIS — K7460 Unspecified cirrhosis of liver: Secondary | ICD-10-CM | POA: Diagnosis present

## 2015-01-05 DIAGNOSIS — Z8673 Personal history of transient ischemic attack (TIA), and cerebral infarction without residual deficits: Secondary | ICD-10-CM | POA: Diagnosis not present

## 2015-01-05 DIAGNOSIS — G4700 Insomnia, unspecified: Secondary | ICD-10-CM | POA: Diagnosis present

## 2015-01-05 DIAGNOSIS — F1020 Alcohol dependence, uncomplicated: Secondary | ICD-10-CM | POA: Diagnosis not present

## 2015-01-05 DIAGNOSIS — J45909 Unspecified asthma, uncomplicated: Secondary | ICD-10-CM | POA: Diagnosis present

## 2015-01-05 DIAGNOSIS — F329 Major depressive disorder, single episode, unspecified: Secondary | ICD-10-CM | POA: Diagnosis present

## 2015-01-05 DIAGNOSIS — I341 Nonrheumatic mitral (valve) prolapse: Secondary | ICD-10-CM | POA: Diagnosis present

## 2015-01-05 LAB — COMPREHENSIVE METABOLIC PANEL
ALT: 36 U/L (ref 17–63)
AST: 54 U/L — ABNORMAL HIGH (ref 15–41)
Albumin: 2.9 g/dL — ABNORMAL LOW (ref 3.5–5.0)
Alkaline Phosphatase: 59 U/L (ref 38–126)
Anion gap: 10 (ref 5–15)
BUN: 5 mg/dL — ABNORMAL LOW (ref 6–20)
CALCIUM: 7.7 mg/dL — AB (ref 8.9–10.3)
CO2: 27 mmol/L (ref 22–32)
CREATININE: 0.97 mg/dL (ref 0.61–1.24)
Chloride: 96 mmol/L — ABNORMAL LOW (ref 101–111)
GFR calc Af Amer: >60 mL/min (ref >60)
GFR calc non Af Amer: >60 mL/min (ref >60)
Glucose, Bld: 101 mg/dL — ABNORMAL HIGH (ref 65–99)
POTASSIUM: 3.9 mmol/L (ref 3.5–5.1)
Sodium: 133 mmol/L — ABNORMAL LOW (ref 135–145)
Total Bilirubin: 0.7 mg/dL (ref 0.3–1.2)
Total Protein: 5.7 g/dL — ABNORMAL LOW (ref 6.5–8.1)

## 2015-01-05 LAB — TROPONIN I
Troponin I: <0.03 ng/mL (ref <0.031)
Troponin I: <0.03 ng/mL (ref <0.031)

## 2015-01-05 LAB — CBC WITH DIFFERENTIAL/PLATELET
Basophils Absolute: 0 10*3/uL (ref 0.0–0.1)
Basophils Relative: 0 % (ref 0–1)
EOS PCT: 0 % (ref 0–5)
Eosinophils Absolute: 0 10*3/uL (ref 0.0–0.7)
HCT: 36.5 % — ABNORMAL LOW (ref 39.0–52.0)
Hemoglobin: 12.5 g/dL — ABNORMAL LOW (ref 13.0–17.0)
Lymphocytes Relative: 11 % — ABNORMAL LOW (ref 12–46)
Lymphs Abs: 1.2 10*3/uL (ref 0.7–4.0)
MCH: 29.1 pg (ref 26.0–34.0)
MCHC: 34.2 g/dL (ref 30.0–36.0)
MCV: 85.1 fL (ref 78.0–100.0)
MONOS PCT: 12 % (ref 3–12)
Monocytes Absolute: 1.3 10*3/uL — ABNORMAL HIGH (ref 0.1–1.0)
NEUTROS PCT: 77 % (ref 43–77)
Neutro Abs: 8.1 10*3/uL — ABNORMAL HIGH (ref 1.7–7.7)
Platelets: 234 10*3/uL (ref 150–400)
RBC: 4.29 MIL/uL (ref 4.22–5.81)
RDW: 18.9 % — ABNORMAL HIGH (ref 11.5–15.5)
WBC: 10.6 10*3/uL — ABNORMAL HIGH (ref 4.0–10.5)

## 2015-01-05 LAB — LIPASE, BLOOD: Lipase: 18 U/L — ABNORMAL LOW (ref 22–51)

## 2015-01-05 LAB — PROTIME-INR
INR: 0.89 (ref 0.00–1.49)
PROTHROMBIN TIME: 12.3 s (ref 11.6–15.2)

## 2015-01-05 MED ORDER — GI COCKTAIL ~~LOC~~
30.0000 mL | Freq: Four times a day (QID) | ORAL | Status: DC | PRN
  Administered 2015-01-06 (×3): 30 mL via ORAL
  Filled 2015-01-05 (×3): qty 30

## 2015-01-05 MED ORDER — ONDANSETRON HCL 4 MG/2ML IJ SOLN
4.0000 mg | Freq: Once | INTRAMUSCULAR | Status: AC
Start: 2015-01-05 — End: 2015-01-05
  Administered 2015-01-05: 4 mg via INTRAVENOUS
  Filled 2015-01-05: qty 2

## 2015-01-05 MED ORDER — PANTOPRAZOLE SODIUM 40 MG PO TBEC: 40.0000 mg | DELAYED_RELEASE_TABLET | Freq: Two times a day (BID) | ORAL | Status: DC

## 2015-01-05 MED ORDER — PANTOPRAZOLE SODIUM 40 MG PO TBEC
40.0000 mg | DELAYED_RELEASE_TABLET | Freq: Two times a day (BID) | ORAL | Status: DC
  Administered 2015-01-05 – 2015-01-06 (×2): 40 mg via ORAL
  Filled 2015-01-05 (×2): qty 1

## 2015-01-05 MED ORDER — NICOTINE 21 MG/24HR TD PT24
21.0000 mg | MEDICATED_PATCH | TRANSDERMAL | Status: DC
  Administered 2015-01-05 – 2015-01-06 (×2): 21 mg via TRANSDERMAL
  Filled 2015-01-05 (×2): qty 1

## 2015-01-05 MED ORDER — MORPHINE SULFATE 2 MG/ML IJ SOLN: 2.0000 mg | INTRAMUSCULAR | Status: DC | PRN

## 2015-01-05 MED ORDER — METOPROLOL TARTRATE 25 MG PO TABS
25.0000 mg | ORAL_TABLET | Freq: Two times a day (BID) | ORAL | Status: DC
  Administered 2015-01-05 – 2015-01-06 (×3): 25 mg via ORAL
  Filled 2015-01-05 (×3): qty 1

## 2015-01-05 MED ORDER — PANTOPRAZOLE SODIUM 40 MG IV SOLR
40.0000 mg | Freq: Two times a day (BID) | INTRAVENOUS | Status: DC
  Administered 2015-01-05: 40 mg via INTRAVENOUS
  Filled 2015-01-05: qty 40

## 2015-01-05 MED ORDER — HYDROMORPHONE HCL 1 MG/ML IJ SOLN
1.0000 mg | Freq: Once | INTRAMUSCULAR | Status: AC
  Administered 2015-01-05: 1 mg via INTRAVENOUS
  Filled 2015-01-05: qty 1

## 2015-01-05 MED ORDER — CETYLPYRIDINIUM CHLORIDE 0.05 % MT LIQD
7.0000 mL | Freq: Two times a day (BID) | OROMUCOSAL | Status: DC
  Administered 2015-01-05 – 2015-01-06 (×3): 7 mL via OROMUCOSAL

## 2015-01-05 MED ORDER — VITAMIN B-1 100 MG PO TABS
100.0000 mg | ORAL_TABLET | Freq: Every day | ORAL | Status: DC
  Administered 2015-01-05 – 2015-01-06 (×2): 100 mg via ORAL
  Filled 2015-01-05 (×2): qty 1

## 2015-01-05 MED ORDER — SODIUM CHLORIDE 0.9 % IV BOLUS (SEPSIS)
1000.0000 mL | Freq: Once | INTRAVENOUS | Status: AC
  Administered 2015-01-05: 1000 mL via INTRAVENOUS

## 2015-01-05 MED ORDER — HEPARIN SODIUM (PORCINE) 5000 UNIT/ML IJ SOLN
5000.0000 [IU] | Freq: Three times a day (TID) | INTRAMUSCULAR | Status: DC
  Administered 2015-01-05 – 2015-01-06 (×5): 5000 [IU] via SUBCUTANEOUS
  Filled 2015-01-05 (×4): qty 1

## 2015-01-05 MED ORDER — ACETAMINOPHEN 325 MG PO TABS
650.0000 mg | ORAL_TABLET | ORAL | Status: DC | PRN
  Administered 2015-01-05 – 2015-01-06 (×2): 650 mg via ORAL
  Filled 2015-01-05 (×2): qty 2

## 2015-01-05 MED ORDER — THIAMINE HCL 100 MG/ML IJ SOLN
Freq: Once | INTRAVENOUS | Status: AC
  Administered 2015-01-05: 05:00:00 via INTRAVENOUS
  Filled 2015-01-05: qty 1000

## 2015-01-05 MED ORDER — TRAZODONE HCL 100 MG PO TABS
300.0000 mg | ORAL_TABLET | Freq: Every day | ORAL | Status: DC
  Administered 2015-01-05: 300 mg via ORAL
  Filled 2015-01-05: qty 3
  Filled 2015-01-05: qty 6

## 2015-01-05 MED ORDER — IPRATROPIUM-ALBUTEROL 0.5-2.5 (3) MG/3ML IN SOLN
3.0000 mL | Freq: Two times a day (BID) | RESPIRATORY_TRACT | Status: DC
  Administered 2015-01-05 – 2015-01-06 (×3): 3 mL via RESPIRATORY_TRACT
  Filled 2015-01-05 (×3): qty 3

## 2015-01-05 MED ORDER — ASPIRIN EC 81 MG PO TBEC
81.0000 mg | DELAYED_RELEASE_TABLET | Freq: Every day | ORAL | Status: DC
  Administered 2015-01-05 – 2015-01-06 (×2): 81 mg via ORAL
  Filled 2015-01-05 (×2): qty 1

## 2015-01-05 MED ORDER — LORAZEPAM 2 MG/ML IJ SOLN: 2.0000 mg | INTRAMUSCULAR | Status: DC | PRN

## 2015-01-05 MED ORDER — ONDANSETRON HCL 4 MG/2ML IJ SOLN
4.0000 mg | Freq: Four times a day (QID) | INTRAMUSCULAR | Status: DC | PRN
  Administered 2015-01-05 (×2): 4 mg via INTRAVENOUS
  Filled 2015-01-05 (×3): qty 2

## 2015-01-05 NOTE — Progress Notes (Signed)
  Echocardiogram 2D Echocardiogram has been performed.  Donata Clay 01/05/2015, 12:23 PM

## 2015-01-05 NOTE — Progress Notes (Signed)
Chaplain responded to spiritual care consult for suicidal thoughts. Pt reports that he no longer feels suicidal, but he only does "when I get drunk and I start feeling sorry for myself." He reports that this time of the years is always difficult for him because it is when he lost his son (six years ago) and his wife (three years ago). Since he lost his family he feels he does not have something to center him / give him peace. Pt also has an extensive medical history and smokes and drinks "even though I know I shouldn't." This appears to be pt's primary means of coping at this point. Chaplain informed pt of her services should he need them. Chaplain will inform unit chaplain of pt.   01/05/15 1000  Clinical Encounter Type  Visited With Patient  Visit Type Initial;Spiritual support  Referral From Nurse  Spiritual Encounters  Spiritual Needs Emotional  Stress Factors  Patient Stress Factors Loss  Mylani Gentry, Barbette Hair, Chaplain 01/05/2015 10:05 AM

## 2015-01-05 NOTE — Consult Note (Signed)
[facility] Consult   Reason for Consult:  Substance induced mood disorder, suicide ideations Referring Physician:  Dr. Aileen Fass Patient Identification: Samuel Reyes MRN:  [id number] Principal Diagnosis: Suicidal ideation Diagnosis:   Patient Active Problem List   Diagnosis Date Noted  . Chest pain [R07.9] 01/05/2015  . Suicidal ideation [R45.851] 01/05/2015  . GERD without esophagitis [K21.9] 01/05/2015  . Persistent vomiting [R11.10] 08/31/2014  . HCAP (healthcare-associated pneumonia) [J18.9] 08/31/2014  . Hypokalemia [E87.6] 08/31/2014  . Hyponatremia [E87.1] 08/31/2014  . Major depressive disorder, recurrent, severe without psychotic features [F33.2]   . Alcohol dependence with uncomplicated withdrawal [X10.626]   . Suicidal ideations [R45.851] 07/21/2014  . Drug overdose [T50.901A] 07/21/2014  . Fever [R50.9]   . Overdose [T50.901A] 07/20/2014  . S/P alcohol detoxification [Z09] 04/15/2014  . Alcohol dependence [F10.20] 03/09/2014  . Major depression, recurrent [F33.9] 03/09/2014  . PTSD (post-traumatic stress disorder) [F43.10] 03/09/2014  . Recurrent ventral incisional hernia [K43.2] 01/20/2014  . Incisional hernia, without obstruction or gangrene [K43.2] 11/26/2013  . Colon polyps [K63.5] 11/26/2013  . DJD (degenerative joint disease) [M19.90] 10/28/2013  . Smoking [Z72.0] 10/28/2013  . Knee pain [M25.569] 06/15/2013  . Essential hypertension [I10] 02/17/2013  . FH: lung cancer [Z80.1] 02/17/2013  . COPD (chronic obstructive pulmonary disease) [J44.9] 02/17/2013  . Smoker [Z72.0] 02/17/2013  . GERD (gastroesophageal reflux disease) [K21.9] 02/17/2013  . Hiatal hernia [K44.9] 02/17/2013  . Bereavement [Z63.4] 02/02/2013    Total Time spent with patient: 45 minutes  Subjective:   Samuel Reyes is a 59 y.o. male patient admitted with chest pain, nausea, vomiting or diarrhea .  HPI: Samuel Reyes is a 59 y.o. male seen face-to-face for this  psychiatric consultation and evaluation for alcohol dependence, substance induced mood disorder and passive suicidal ideation. Patient reported he was relocated to [facility] and stayed with half a house for about 2 months and did not get along with the other people so he was mood with one of the friend who also has another friend leaving there. Patient reportedly drinks heavily about 4-540 ounces of beer and also liquor if is available. Patient reported he has been drinking for the last 45 years of his life and also smokes tobacco up to 1 pack a day. Patient reported he does not eat or drink or take care of his personal hygiene while he is drinking. Patient takes over-the-counter medication for stomach pain and headaches before coming to the hospital. Patient has been stabilized without severe alcohol withdrawal symptoms at this time. Patient blood alcohol level on admission is 315, and elevated AST but normal ALT. Patient reported he feels sad, depressed and becomes suicidal when he was intoxicated. Patient reported he does not feel suicidal when he becomes sober. Patient is currently sober and denies current suicidal, homicidal ideation, intention or plans. Patient has no evidence of psychosis. Patient wants to stay sober and won't get further help to quit drinking at this time Patient is willing to participate in residential substance abuse treatment program when medically discharged from the hospital.  HPI Elements:   Location:  Depression and substance abuse. Quality:  Fair to poor. Severity:  Chronic alcohol drinking. Timing:  Unknown. Duration:  Several months. Context:  Psychosocial stresses, and disability, and unfriendly environment to stay sober.  Past Medical History:  Past Medical History  Diagnosis Date  . Cirrhosis   . Degenerative joint disease   . Hypertension   . Gastric ulcer   . Depression   . Asthma   .  Mitral valve prolapse 2002  . H/O hiatal hernia   . COPD (chronic  obstructive pulmonary disease)   . TIA (transient ischemic attack)     2010  . Stroke     TIA - 2010 - no deficits   . Shortness of breath     with exertion   . GERD (gastroesophageal reflux disease)   . Headache(784.0)   . Hiatal hernia 1982  . Degenerative joint disease   . Anxiety   . Alcohol abuse     Past Surgical History  Procedure Laterality Date  . Gastrectomy    . Shoulder surgery Bilateral     3 surgeries on on left, 2 surgeries on right   . Rt knee arthroscopic surgery    . Back surgery      3 cervical spine surgeries C4-C5 fused  . Hernia repair    . Finger surgery Left     2nd, 3rd, & 4th fingers were cut off by table saw and reattached  . Colonoscopy N/A 01/04/2014    Procedure: COLONOSCOPY;  Surgeon: Danie Binder, MD;  Location: [facility];  Service: Endoscopy;  Laterality: N/A;  1:45  . Esophagogastroduodenoscopy N/A 01/04/2014    Procedure: ESOPHAGOGASTRODUODENOSCOPY (EGD);  Surgeon: Danie Binder, MD;  Location: [facility];  Service: Endoscopy;  Laterality: N/A;  . Incisional hernia repair N/A 01/20/2014    Procedure: LAPAROSCOPIC RECURRENT  INCISIONAL HERNIA with mesh;  Surgeon: Edward Jolly, MD;  Location: [facility];  Service: General;  Laterality: N/A;   Family History:  Family History  Problem Relation Age of Onset  . Cancer Father     bone  . Cancer Brother     lungs  . Stroke Maternal Grandmother   . Colon cancer Neg Hx   . Asthma Son     died at age 47 in his sleep   . Spina bifida Son     died at age 76    Social History:  History  Alcohol Use  . 3.6 oz/week  . 6 Cans of beer per week    Comment: 1 case of beer and 1 shot of vodka between 1030am-1430pm     History  Drug Use No    Comment: denid using any drugs    History   Social History  . Marital Status: Widowed    Spouse Name: N/A  . Number of Children: N/A  . Years of Education: N/A   Occupational History  . Disability    Social History Main Topics  . Smoking  status: Current Every Day Smoker -- 1.00 packs/day for 45 years    Types: Cigarettes    Start date: 07/30/1966  . Smokeless tobacco: Never Used  . Alcohol Use: 3.6 oz/week    6 Cans of beer per week     Comment: 1 case of beer and 1 shot of vodka between 1030am-1430pm  . Drug Use: No     Comment: denid using any drugs  . Sexual Activity: Not Currently   Other Topics Concern  . Not on file   Social History Narrative   Additional Social History:    History of alcohol / drug use?: Yes Negative Consequences of Use: Financial, Personal relationships, Work / School Name of Substance 1: Alcohol 1 - Age of First Use: 59 years old 59 - Amount (size/oz): 5 or 6 (40oz) 1 - Frequency: Daily 1 - Duration: Years, - Reports that he does not remember how long it has been  1 - Last Use / Amount: Today 2 (40oz) of beer before coming to the ED                   Allergies:   Allergies  Allergen Reactions  . Bee Venom Anaphylaxis  . Penicillins Rash    Labs:  Results for orders placed or performed during the hospital encounter of 01/04/15 (from the past 48 hour(s))  CBC     Status: Abnormal   Collection Time: 01/04/15  2:32 PM  Result Value Ref Range   WBC 11.2 (H) 4.0 - 10.5 K/uL   RBC 4.91 4.22 - 5.81 MIL/uL   Hemoglobin 14.2 13.0 - 17.0 g/dL   HCT 40.5 39.0 - 52.0 %   MCV 82.5 78.0 - 100.0 fL   MCH 28.9 26.0 - 34.0 pg   MCHC 35.1 30.0 - 36.0 g/dL   RDW 18.4 (H) 11.5 - 15.5 %   Platelets 282 150 - 400 K/uL  Basic metabolic panel     Status: Abnormal   Collection Time: 01/04/15  2:32 PM  Result Value Ref Range   Sodium 126 (L) 135 - 145 mmol/L   Potassium 3.3 (L) 3.5 - 5.1 mmol/L   Chloride 83 (L) 101 - 111 mmol/L   CO2 24 22 - 32 mmol/L   Glucose, Bld 96 65 - 99 mg/dL   BUN <5 (L) 6 - 20 mg/dL   Creatinine, Ser 0.81 0.61 - 1.24 mg/dL   Calcium 8.2 (L) 8.9 - 10.3 mg/dL   GFR calc non Af Amer >60 >60 mL/min   GFR calc Af Amer >60 >60 mL/min    Comment: (NOTE) The eGFR  has been calculated using the CKD EPI equation. This calculation has not been validated in all clinical situations. eGFR's persistently <60 mL/min signify possible Chronic Kidney Disease.    Anion gap 19 (H) 5 - 15  BNP (order ONLY if patient complains of dyspnea/SOB AND you have documented it for THIS visit)     Status: None   Collection Time: 01/04/15  2:32 PM  Result Value Ref Range   B Natriuretic Peptide 20.9 0.0 - 100.0 pg/mL  Hepatic function panel     Status: Abnormal   Collection Time: 01/04/15  2:32 PM  Result Value Ref Range   Total Protein 6.5 6.5 - 8.1 g/dL   Albumin 3.4 (L) 3.5 - 5.0 g/dL   AST 95 (H) 15 - 41 U/L   ALT 43 17 - 63 U/L   Alkaline Phosphatase 66 38 - 126 U/L   Total Bilirubin 0.5 0.3 - 1.2 mg/dL   Bilirubin, Direct 0.1 0.1 - 0.5 mg/dL   Indirect Bilirubin 0.4 0.3 - 0.9 mg/dL  Lipase, blood     Status: None   Collection Time: 01/04/15  2:32 PM  Result Value Ref Range   Lipase 35 22 - 51 U/L  Ethanol     Status: Abnormal   Collection Time: 01/04/15  2:32 PM  Result Value Ref Range   Alcohol, Ethyl (B) 315 (HH) <5 mg/dL    Comment:        LOWEST DETECTABLE LIMIT FOR SERUM ALCOHOL IS 5 mg/dL FOR MEDICAL PURPOSES ONLY REPEATED TO VERIFY CRITICAL RESULT CALLED TO, READ BACK BY AND VERIFIED WITH: N STEPHENS,RN 1606 01/04/15 D BRADLEY   I-stat troponin, ED  (not at [facility])     Status: None   Collection Time: 01/04/15  2:37 PM  Result Value Ref Range   Troponin  i, poc 0.00 0.00 - 0.08 ng/mL   Comment 3            Comment: Due to the release kinetics of cTnI, a negative result within the first hours of the onset of symptoms does not rule out myocardial infarction with certainty. If myocardial infarction is still suspected, repeat the test at appropriate intervals.   Troponin I     Status: None   Collection Time: 01/04/15  4:03 PM  Result Value Ref Range   Troponin I <0.03 <0.031 ng/mL    Comment:        NO INDICATION OF MYOCARDIAL INJURY.    Urinalysis, Routine w reflex microscopic (not at [facility])     Status: Abnormal   Collection Time: 01/04/15  6:03 PM  Result Value Ref Range   Color, Urine YELLOW YELLOW   APPearance CLEAR CLEAR   Specific Gravity, Urine 1.004 (L) 1.005 - 1.030   pH 5.0 5.0 - 8.0   Glucose, UA NEGATIVE NEGATIVE mg/dL   Hgb urine dipstick NEGATIVE NEGATIVE   Bilirubin Urine NEGATIVE NEGATIVE   Ketones, ur 15 (A) NEGATIVE mg/dL   Protein, ur NEGATIVE NEGATIVE mg/dL   Urobilinogen, UA 0.2 0.0 - 1.0 mg/dL   Nitrite NEGATIVE NEGATIVE   Leukocytes, UA NEGATIVE NEGATIVE    Comment: MICROSCOPIC NOT DONE ON URINES WITH NEGATIVE PROTEIN, BLOOD, LEUKOCYTES, NITRITE, OR GLUCOSE <1000 mg/dL.  Urine rapid drug screen (hosp performed)not at [facility]     Status: None   Collection Time: 01/04/15  6:03 PM  Result Value Ref Range   Opiates NONE DETECTED NONE DETECTED   Cocaine NONE DETECTED NONE DETECTED   Benzodiazepines NONE DETECTED NONE DETECTED   Amphetamines NONE DETECTED NONE DETECTED   Tetrahydrocannabinol NONE DETECTED NONE DETECTED   Barbiturates NONE DETECTED NONE DETECTED    Comment:        DRUG SCREEN FOR MEDICAL PURPOSES ONLY.  IF CONFIRMATION IS NEEDED FOR ANY PURPOSE, NOTIFY LAB WITHIN 5 DAYS.        LOWEST DETECTABLE LIMITS FOR URINE DRUG SCREEN Drug Class       Cutoff (ng/mL) Amphetamine      1000 Barbiturate      200 Benzodiazepine   662 Tricyclics       947 Opiates          300 Cocaine          300 THC              50   Troponin I     Status: None   Collection Time: 01/05/15 12:00 AM  Result Value Ref Range   Troponin I <0.03 <0.031 ng/mL    Comment:        NO INDICATION OF MYOCARDIAL INJURY.   CBC with Differential/Platelet     Status: Abnormal   Collection Time: 01/05/15  3:45 AM  Result Value Ref Range   WBC 10.6 (H) 4.0 - 10.5 K/uL   RBC 4.29 4.22 - 5.81 MIL/uL   Hemoglobin 12.5 (L) 13.0 - 17.0 g/dL   HCT 36.5 (L) 39.0 - 52.0 %   MCV 85.1 78.0 - 100.0 fL   MCH 29.1 26.0 - 34.0  pg   MCHC 34.2 30.0 - 36.0 g/dL   RDW 18.9 (H) 11.5 - 15.5 %   Platelets 234 150 - 400 K/uL   Neutrophils Relative % 77 43 - 77 %   Neutro Abs 8.1 (H) 1.7 - 7.7 K/uL   Lymphocytes Relative 11 (L) 12 -  46 %   Lymphs Abs 1.2 0.7 - 4.0 K/uL   Monocytes Relative 12 3 - 12 %   Monocytes Absolute 1.3 (H) 0.1 - 1.0 K/uL   Eosinophils Relative 0 0 - 5 %   Eosinophils Absolute 0.0 0.0 - 0.7 K/uL   Basophils Relative 0 0 - 1 %   Basophils Absolute 0.0 0.0 - 0.1 K/uL  Comprehensive metabolic panel     Status: Abnormal   Collection Time: 01/05/15  3:45 AM  Result Value Ref Range   Sodium 133 (L) 135 - 145 mmol/L    Comment: DELTA CHECK NOTED   Potassium 3.9 3.5 - 5.1 mmol/L   Chloride 96 (L) 101 - 111 mmol/L   CO2 27 22 - 32 mmol/L   Glucose, Bld 101 (H) 65 - 99 mg/dL   BUN 5 (L) 6 - 20 mg/dL   Creatinine, Ser 0.97 0.61 - 1.24 mg/dL   Calcium 7.7 (L) 8.9 - 10.3 mg/dL   Total Protein 5.7 (L) 6.5 - 8.1 g/dL   Albumin 2.9 (L) 3.5 - 5.0 g/dL   AST 54 (H) 15 - 41 U/L   ALT 36 17 - 63 U/L   Alkaline Phosphatase 59 38 - 126 U/L   Total Bilirubin 0.7 0.3 - 1.2 mg/dL   GFR calc non Af Amer >60 >60 mL/min   GFR calc Af Amer >60 >60 mL/min    Comment: (NOTE) The eGFR has been calculated using the CKD EPI equation. This calculation has not been validated in all clinical situations. eGFR's persistently <60 mL/min signify possible Chronic Kidney Disease.    Anion gap 10 5 - 15  Protime-INR     Status: None   Collection Time: 01/05/15  3:45 AM  Result Value Ref Range   Prothrombin Time 12.3 11.6 - 15.2 seconds   INR 0.89 0.00 - 1.49  Lipase, blood     Status: Abnormal   Collection Time: 01/05/15  3:45 AM  Result Value Ref Range   Lipase 18 (L) 22 - 51 U/L  Troponin I (q 6hr x 3)     Status: None   Collection Time: 01/05/15  3:45 AM  Result Value Ref Range   Troponin I <0.03 <0.031 ng/mL    Comment:        NO INDICATION OF MYOCARDIAL INJURY.   Troponin I (q 6hr x 3)     Status: None    Collection Time: 01/05/15  9:52 AM  Result Value Ref Range   Troponin I <0.03 <0.031 ng/mL    Comment:        NO INDICATION OF MYOCARDIAL INJURY.     Vitals: Blood pressure 145/82, pulse 79, temperature 98.7 F (37.1 C), temperature source Oral, resp. rate 20, height 5' 4"  (1.626 m), weight 65.726 kg (144 lb 14.4 oz), SpO2 97 %.  Risk to Self: Suicidal Ideation: Yes-Currently Present Suicidal Intent: Yes-Currently Present Is patient at risk for suicide?: Yes Suicidal Plan?: Yes-Currently Present Specify Current Suicidal Plan: Drink himself to death. Access to Means: Yes Specify Access to Suicidal Means: Beer What has been your use of drugs/alcohol within the last 12 months?: Beer How many times?: 1 Other Self Harm Risks: NA Triggers for Past Attempts: Other (Comment) (His first son died.) Intentional Self Injurious Behavior: None Risk to Others: Homicidal Ideation: No Thoughts of Harm to Others: No Current Homicidal Intent: No Current Homicidal Plan: No Access to Homicidal Means: No Identified Victim: NA History of harm to others?: No Assessment  of Violence: None Noted Violent Behavior Description: NA Does patient have access to weapons?: No Criminal Charges Pending?: No Does patient have a court date: No Prior Inpatient Therapy: Prior Inpatient Therapy: Yes Prior Therapy Dates: 2014 Prior Therapy Facilty/Provider(s): [facility] Reason for Treatment: SI Prior Outpatient Therapy: Prior Outpatient Therapy: Yes Prior Therapy Dates: 3 months ago Prior Therapy Facilty/Provider(s): [facility]  Reason for Treatment: Medication Management  Does patient have an ACCT team?: No Does patient have Intensive In-House Services?  : No Does patient have [facility] services? : Yes (He has not been in 3 months.) Does patient have [facility] services?: No  Current Facility-Administered Medications  Medication Dose Route Frequency Provider Last Rate Last Dose  . acetaminophen (TYLENOL) tablet 650 mg  650  mg Oral Q4H PRN Lavina Hamman, MD   650 mg at 01/05/15 0827  . antiseptic oral rinse (CPC / CETYLPYRIDINIUM CHLORIDE 0.05%) solution 7 mL  7 mL Mouth Rinse BID Lavina Hamman, MD   7 mL at 01/05/15 1050  . aspirin EC tablet 81 mg  81 mg Oral Daily Lavina Hamman, MD   81 mg at 01/05/15 1031  . gi cocktail (Maalox,Lidocaine,Donnatal)  30 mL Oral QID PRN Lavina Hamman, MD      . heparin injection 5,000 Units  5,000 Units Subcutaneous 3 times per day Lavina Hamman, MD   5,000 Units at 01/05/15 0535  . ipratropium-albuterol (DUONEB) 0.5-2.5 (3) MG/3ML nebulizer solution 3 mL  3 mL Inhalation BID Lavina Hamman, MD   3 mL at 01/05/15 0823  . LORazepam (ATIVAN) injection 2-3 mg  2-3 mg Intravenous Q1H PRN Lavina Hamman, MD      . metoprolol tartrate (LOPRESSOR) tablet 25 mg  25 mg Oral BID Lavina Hamman, MD   25 mg at 01/05/15 1031  . morphine 2 MG/ML injection 2 mg  2 mg Intravenous Q4H PRN Lavina Hamman, MD      . nicotine (NICODERM CQ - dosed in mg/24 hours) patch 21 mg  21 mg Transdermal Q24H Lavina Hamman, MD   21 mg at 01/05/15 0827  . ondansetron (ZOFRAN) injection 4 mg  4 mg Intravenous Q6H PRN Lavina Hamman, MD   4 mg at 01/05/15 1251  . pantoprazole (PROTONIX) EC tablet 40 mg  40 mg Oral BID Charlynne Cousins, MD      . sodium chloride 0.9 % bolus 1,000 mL  1,000 mL Intravenous Once Charlynne Cousins, MD      . thiamine (VITAMIN B-1) tablet 100 mg  100 mg Oral Daily Lavina Hamman, MD   100 mg at 01/05/15 1031  . traZODone (DESYREL) tablet 300 mg  300 mg Oral QHS Lavina Hamman, MD        Musculoskeletal: Strength & Muscle Tone: within normal limits Gait & Station: unable to stand Patient leans: N/A  Psychiatric Specialty Exam: Physical Exam as per history and physical   ROS complaints about stomach discomfort/pain but denied nausea, vomiting, tremors, dizziness, chart shortness of breath and chest pain. No Fever-chills, No Headache, No changes with Vision or hearing,  reports vertigo No problems swallowing food or Liquids, No Chest pain, Cough or Shortness of Breath, No Abdominal pain, No Nausea or Vommitting, Bowel movements are regular, No Blood in stool or Urine, No dysuria, No new skin rashes or bruises, No new joints pains-aches,  No new weakness, tingling, numbness in any extremity, No recent weight gain or loss, No polyuria, polydypsia  or polyphagia,   A full 10 point Review of Systems was done, except as stated above, all other Review of Systems were negative.   Blood pressure 145/82, pulse 79, temperature 98.7 F (37.1 C), temperature source Oral, resp. rate 20, height 5' 4"  (1.626 m), weight 65.726 kg (144 lb 14.4 oz), SpO2 97 %.Body mass index is 24.86 kg/(m^2).  General Appearance: Casual  Eye Contact::  Good  Speech:  Clear and Coherent  Volume:  Decreased  Mood:  Euthymic  Affect:  Appropriate, Congruent and bright with good smile  Thought Process:  Coherent and Goal Directed  Orientation:  Full (Time, Place, and Person)  Thought Content:  WDL  Suicidal Thoughts:  No  Homicidal Thoughts:  No  Memory:  Immediate;   Good Recent;   Good  Judgement:  Intact  Insight:  Good  Psychomotor Activity:  Decreased  Concentration:  Good  Recall:  Good  Fund of Knowledge:Good  Language: Good  Akathisia:  Negative  Handed:  Right  AIMS (if indicated):     Assets:  Communication Skills Desire for Improvement Financial Resources/Insurance Leisure Time Resilience Social Support Talents/Skills  ADL's:  Intact  Cognition: WNL  Sleep:      Medical Decision Making: Review of Psycho-Social Stressors (1), Review or order clinical lab tests (1), Established Problem, Worsening (2), Review of Last Therapy Session (1), Review or order medicine tests (1), Review of Medication Regimen & Side Effects (2) and Review of New Medication or Change in Dosage (2)  Treatment Plan Summary: Patient presented with alcohol intoxication and history of  alcohol dependence and nicotine dependence, patient has substance induced mood disorder. Patient denies current suicidal ideation and reported he feels depressed and suicidal when he get alcohol intoxication. Patient contract for safety at this time. Daily contact with patient to assess and evaluate symptoms and progress in treatment and Medication management  Plan:  Alcohol intoxication versus dependence:.  Monitored for the alcohol withdrawal symptoms, Ativan detox and CIWA protocol Continue thiamine supplementation as a supportive care Patient shows interest for the long-term rehabilitation treatment  Suicidal ideation:  Patient contract for safety and denies current suicidal ideation.  Depression/Insomnia: Trazodone 300 mg at bedtime  Patient will be referred to the psychiatric social service regarding disposition plan which is finding a residential treatment program for substance abuse.  No evidence of imminent risk to self or others at present.   Patient does not meet criteria for psychiatric inpatient admission. Supportive therapy provided about ongoing stressors. Patient will be referred to the residential substance abuse treatment program when medically stable.  Disposition: Patient will be referred to the substance abuse treatment program.  Mariachristina Holle,JANARDHAHA R. 01/05/2015 1:47 PM

## 2015-01-05 NOTE — Discharge Summary (Addendum)
Physician Discharge Summary  Samuel Reyes GGE:[id number] DOB: Sep 05, 1955 DOA: 01/04/2015  PCP: Lorayne Marek, MD  Admit date: 01/04/2015 Discharge date: 01/05/2015  Time spent: 35 min minutes  Recommendations for Outpatient Follow-up:  1. We transferred to [facility]. 2. We'll follow-up with PCP as an outpatient in 4 weeks continue PPI for 2 weeks twice a day then daily.  Discharge Diagnoses:  Principal Problem:   Suicidal ideation Active Problems:   Essential hypertension   COPD (chronic obstructive pulmonary disease)   Alcohol dependence   Chest pain   GERD without esophagitis   Discharge Condition: stable  Diet recommendation: heart  Filed Weights   01/05/15 0404  Weight: 65.726 kg (144 lb 14.4 oz)    History of present illness:  59 y.o. male with Past medical history of alcohol abuse, hypertension, COPD, CVA, GERD. The patient is presenting with complaints of chest pain. The pain is located on the left side ongoing for last 4 days and feels like a burning pain. The pain is on and off occurring resolved with rest. Pain does not radiate anywhere. He also had episodes of nausea with vomiting more than 5 episodes of vomiting on a daily basis since last 3 days. He also had more than 5 episodes of diarrhea with loose watery without any blood in/3 days. He has been drinking alcohol on a regular basis but since last one month has been drinking heavily tried to kill himself. He drinks 4-5 40 ounces of beer on a daily basis. He mentions he is taking aspirin trazodone and inhalers but does not take any other medications. He is an active smoker.  Hospital Course:  Atypical chest pain: - He was admitted to the telemetry unit with no events, troponins were negative 3, chest x-ray shows no acute infiltrates, abdominal x-ray showed no etiology. Cardiology was consulted and recommended no further testing. - He had an endoscopy on 6 2015 that showed erosive gastritis due to  NSAID use. He relates he continues to take NSAIDs and has not been taking his Protonix. He's also been consuming large quantities of alcohol which is probably the cause of his chest pain. He relates his pain is better, will advance his diet continue [place]'s twice a day for 14 days and from there daily.  - I Have counseled him about avoiding NSAIDs including Goody powder.  Suicidal ideation: Patient is medically stable to be transferred to behavioral health. Psychiatric was consulted and recommended inpatient rehabilitation.  Alcohol dependence: He was started on IV fluids thiamine and folate and monitor with CIWAwith no withdrawal symptoms.  History of COPD: Continue current medications.  Nausea and vomiting: Probably related to alcoholic gastritis. Currently symptom-free. He was started on IV fluid hydration. And he tolerated his diet.   Procedures:  CXR  Abd x-ray  Consultations:  cardiology  Discharge Exam: Filed Vitals:   01/05/15 1031  BP: 145/72  Pulse: 105  Temp:   Resp:     General: A&O x3 Cardiovascular: RRR Respiratory: good air movement CTA B/L  Discharge Instructions   Discharge Instructions    Diet - low sodium heart healthy    Complete by:  As directed      Increase activity slowly    Complete by:  As directed           Current Discharge Medication List    CONTINUE these medications which have CHANGED   Details  pantoprazole (PROTONIX) 40 MG tablet Take 1 tablet (40 mg total) by mouth 2 (  two) times daily. Qty: 30 tablet, Refills: 0      CONTINUE these medications which have NOT CHANGED   Details  albuterol (PROVENTIL HFA;VENTOLIN HFA) 108 (90 BASE) MCG/ACT inhaler Inhale 2 puffs into the lungs every 6 (six) hours as needed for wheezing or shortness of breath. Qty: 1 Inhaler, Refills: 5    albuterol-ipratropium (COMBIVENT) 18-103 MCG/ACT inhaler Inhale 2 puffs into the lungs 2 (two) times daily. For shortness of breath Qty: 1 Inhaler,  Refills: 5   Associated Diagnoses: Chronic obstructive pulmonary disease, unspecified COPD, unspecified chronic bronchitis type    gabapentin (NEURONTIN) 300 MG capsule Take 1 capsule (300 mg total) by mouth 2 (two) times daily. For substance withdrawal syndrome Qty: 60 capsule, Refills: 0    metoprolol tartrate (LOPRESSOR) 25 MG tablet Take 1 tablet (25 mg total) by mouth 2 (two) times daily. For high blood pressure Qty: 60 tablet, Refills: 2   Associated Diagnoses: Essential hypertension    trazodone (DESYREL) 150 MG tablet Take 2 tablets (300 mg total) by mouth at bedtime. For sleep Qty: 60 tablet, Refills: 0    acamprosate (CAMPRAL) 333 MG tablet Take 2 tablets (666 mg total) by mouth 3 (three) times daily with meals. For alcoholism Qty: 60 tablet, Refills: 0    aspirin 81 MG chewable tablet Chew 1 tablet (81 mg total) by mouth daily.    FLUoxetine (PROZAC) 40 MG capsule Take 2 capsules (80 mg total) by mouth daily. For depression Qty: 60 capsule, Refills: 0    nicotine (NICODERM CQ - DOSED IN MG/24 HOURS) 21 mg/24hr patch PLACE 1 PATCH ONTO THE SKIN DAILY Qty: 28 patch, Refills: 1      STOP taking these medications     Aspirin-Acetaminophen (GOODYS BODY PAIN PO)      famotidine (PEPCID) 20 MG tablet        Allergies  Allergen Reactions  . Bee Venom Anaphylaxis  . Penicillins Rash   Follow-up Information    Follow up with Lorayne Marek, MD.   Specialty:  Internal Medicine   Why:  hospital follow up   Contact information:   [place] [phone]        The results of significant diagnostics from this hospitalization (including imaging, microbiology, ancillary and laboratory) are listed below for reference.    Significant Diagnostic Studies: Dg Chest 2 View  01/04/2015   CLINICAL DATA:  Chest pain. Initial encounter. Sharp substernal chest pain onset of symptoms 2 hr ago. Short of breath and nausea.  EXAM: CHEST  2 VIEW   COMPARISON:  12/10/2014.  FINDINGS: Cardiopericardial silhouette within normal limits. Mediastinal contours normal. Trachea midline. No airspace disease or effusion. Surgical clips in the upper abdomen. Truncation of the distal LEFT clavicle, likely postsurgical.  IMPRESSION: No active cardiopulmonary disease.   Electronically Signed   By: Dereck Ligas M.D.   On: 01/04/2015 15:20   Dg Chest 2 View  12/10/2014   CLINICAL DATA:  Chest pain, shortness of breath.  EXAM: CHEST  2 VIEW  COMPARISON:  August 31, 2014.  FINDINGS: The heart size and mediastinal contours are within normal limits. Both lungs are clear. No pneumothorax or pleural effusion is noted. The visualized skeletal structures are unremarkable.  IMPRESSION: No active cardiopulmonary disease.   Electronically Signed   By: Marijo Conception, M.D.   On: 12/10/2014 14:32   Dg Abd Acute W/chest  01/04/2015   CLINICAL DATA:  Mid chest pain.  Symptoms for 1  week.  EXAM: DG ABDOMEN ACUTE W/ 1V CHEST  COMPARISON:  Plain films earlier today.  FINDINGS: Scattered air-fluid levels throughout small and large bowel without definite obstruction. Increased number of visible small bowel loops is also abnormal. No free air is observed. Surgical clips surround the GE junction. Radiodense material in LEFT upper quadrant likely ingested within the stomach. Heart size and mediastinal contours are within normal limits. Both lungs are clear.  IMPRESSION: Suspected ileus. No acute cardiopulmonary disease. Stable appearance of the chest from priors.   Electronically Signed   By: Rolla Flatten M.D.   On: 01/04/2015 20:01    Microbiology: No results found for this or any previous visit (from the past 240 hour(s)).   Labs: Basic Metabolic Panel:  Recent Labs Lab 01/04/15 1432 01/05/15 0345  NA 126* 133*  K 3.3* 3.9  CL 83* 96*  CO2 24 27  GLUCOSE 96 101*  BUN <5* 5*  CREATININE 0.81 0.97  CALCIUM 8.2* 7.7*   Liver Function Tests:  Recent Labs Lab  01/04/15 1432 01/05/15 0345  AST 95* 54*  ALT 43 36  ALKPHOS 66 59  BILITOT 0.5 0.7  PROT 6.5 5.7*  ALBUMIN 3.4* 2.9*    Recent Labs Lab 01/04/15 1432 01/05/15 0345  LIPASE 35 18*   No results for input(s): AMMONIA in the last 168 hours. CBC:  Recent Labs Lab 01/04/15 1432 01/05/15 0345  WBC 11.2* 10.6*  NEUTROABS  --  8.1*  HGB 14.2 12.5*  HCT 40.5 36.5*  MCV 82.5 85.1  PLT 282 234   Cardiac Enzymes:  Recent Labs Lab 01/04/15 1603 01/05/15 01/05/15 0345 01/05/15 0952  TROPONINI <0.03 <0.03 <0.03 <0.03   BNP: BNP (last 3 results)  Recent Labs  12/10/14 1339 01/04/15 1432  BNP 46.5 20.9    ProBNP (last 3 results)  Recent Labs  04/14/14 1911  PROBNP 49.4    CBG: No results for input(s): GLUCAP in the last 168 hours.     Signed:  Charlynne Cousins  [facility] 01/05/2015, 11:05 AM

## 2015-01-05 NOTE — H&P (Signed)
[facility] History and Physical  Patient: Samuel Reyes  MRN: [id number]  DOB: Dec 31, 1955  DOS: the patient was seen and examined on 01/05/2015 PCP: Lorayne Marek, MD  Chief Complaint: Chest pain  HPI: Brandt Chaney is a 59 y.o. male with Past medical history of alcohol abuse, hypertension, COPD, CVA, GERD. The patient is presenting with complaints of chest pain. The pain is located on the left side ongoing for last 4 days and feels like a burning pain. The pain is on and off occurring resolved with rest. Pain does not radiate anywhere. He also had episodes of nausea with vomiting more than 5 episodes of vomiting on a daily basis since last 3 days. He also had more than 5 episodes of diarrhea with loose watery without any blood in/3 days. He has been drinking alcohol on a regular basis but since last one month has been drinking heavily tried to kill himself. He drinks 4-5 40 ounces of beer on a daily basis. He mentions he is taking aspirin trazodone and inhalers but does not take any other medications. He is an active smoker.  The patient is coming from home. And at his baseline independent for most of his ADL.  Review of Systems: as mentioned in the history of present illness.  A comprehensive review of the other systems is negative.  Past Medical History  Diagnosis Date  . Cirrhosis   . Degenerative joint disease   . Hypertension   . Gastric ulcer   . Depression   . Asthma   . Mitral valve prolapse 2002  . H/O hiatal hernia   . COPD (chronic obstructive pulmonary disease)   . TIA (transient ischemic attack)     2010  . Stroke     TIA - 2010 - no deficits   . Shortness of breath     with exertion   . GERD (gastroesophageal reflux disease)   . Headache(784.0)   . Hiatal hernia 1982  . Degenerative joint disease   . Anxiety   . Alcohol abuse    Past Surgical History  Procedure Laterality Date  . Gastrectomy    . Shoulder surgery Bilateral     3 surgeries  on on left, 2 surgeries on right   . Rt knee arthroscopic surgery    . Back surgery      3 cervical spine surgeries C4-C5 fused  . Hernia repair    . Finger surgery Left     2nd, 3rd, & 4th fingers were cut off by table saw and reattached  . Colonoscopy N/A 01/04/2014    Procedure: COLONOSCOPY;  Surgeon: Danie Binder, MD;  Location: [facility];  Service: Endoscopy;  Laterality: N/A;  1:45  . Esophagogastroduodenoscopy N/A 01/04/2014    Procedure: ESOPHAGOGASTRODUODENOSCOPY (EGD);  Surgeon: Danie Binder, MD;  Location: [facility];  Service: Endoscopy;  Laterality: N/A;  . Incisional hernia repair N/A 01/20/2014    Procedure: LAPAROSCOPIC RECURRENT  INCISIONAL HERNIA with mesh;  Surgeon: Edward Jolly, MD;  Location: [facility];  Service: General;  Laterality: N/A;   Social History:  reports that he has been smoking Cigarettes.  He started smoking about 48 years ago. He has a 45 pack-year smoking history. He has never used smokeless tobacco. He reports that he drinks about 3.6 oz of alcohol per week. He reports that he does not use illicit drugs.  Allergies  Allergen Reactions  . Bee Venom Anaphylaxis  . Penicillins Rash    Family History  Problem Relation Age of Onset  . Cancer Father     bone  . Cancer Brother     lungs  . Stroke Maternal Grandmother   . Colon cancer Neg Hx   . Asthma Son     died at age 20 in his sleep   . Spina bifida Son     died at age 106     Prior to Admission medications   Medication Sig Start Date End Date Taking? Authorizing Provider  albuterol (PROVENTIL HFA;VENTOLIN HFA) 108 (90 BASE) MCG/ACT inhaler Inhale 2 puffs into the lungs every 6 (six) hours as needed for wheezing or shortness of breath. 03/12/14  Yes Encarnacion Slates, NP  albuterol-ipratropium (COMBIVENT) 18-103 MCG/ACT inhaler Inhale 2 puffs into the lungs 2 (two) times daily. For shortness of breath 09/22/14  Yes Lorayne Marek, MD  Aspirin-Acetaminophen (GOODYS BODY PAIN PO) Take 2  packets by mouth daily as needed (for pain).   Yes Historical Provider, MD  famotidine (PEPCID) 20 MG tablet Take 1 tablet (20 mg total) by mouth 2 (two) times daily. 12/10/14  Yes [name], MD  gabapentin (NEURONTIN) 300 MG capsule Take 1 capsule (300 mg total) by mouth 2 (two) times daily. For substance withdrawal syndrome 08/08/14  Yes Shuvon B Rankin, NP  metoprolol tartrate (LOPRESSOR) 25 MG tablet Take 1 tablet (25 mg total) by mouth 2 (two) times daily. For high blood pressure 09/22/14  Yes Deepak Advani, MD  trazodone (DESYREL) 150 MG tablet Take 2 tablets (300 mg total) by mouth at bedtime. For sleep 08/08/14  Yes Shuvon B Rankin, NP  acamprosate (CAMPRAL) 333 MG tablet Take 2 tablets (666 mg total) by mouth 3 (three) times daily with meals. For alcoholism Patient not taking: Reported on 12/10/2014 08/08/14   Shuvon B Rankin, NP  aspirin 81 MG chewable tablet Chew 1 tablet (81 mg total) by mouth daily. Patient not taking: Reported on 12/10/2014 09/05/14   Verlee Monte, MD  FLUoxetine (PROZAC) 40 MG capsule Take 2 capsules (80 mg total) by mouth daily. For depression Patient not taking: Reported on 12/10/2014 08/08/14   Shuvon B Rankin, NP  nicotine (NICODERM CQ - DOSED IN MG/24 HOURS) 21 mg/24hr patch PLACE 1 PATCH ONTO THE SKIN DAILY Patient not taking: Reported on 01/04/2015 11/05/14   Lorayne Marek, MD  pantoprazole (PROTONIX) 40 MG tablet Take 1 tablet (40 mg total) by mouth daily. Patient not taking: Reported on 12/10/2014 09/05/14   Verlee Monte, MD    Physical Exam: Filed Vitals:   01/05/15 0255 01/05/15 0256 01/05/15 0300 01/05/15 0404  BP:   175/77 158/77  Pulse: 102 102 113 105  Temp:    98.5 F (36.9 C)  TempSrc:    Oral  Resp: 26 20 31 22   Height:    5\' 4"  (1.626 m)  Weight:    65.726 kg (144 lb 14.4 oz)  SpO2: 92% 92%  94%    General: Alert, Awake and Oriented to Time, Place and Person. Appear in mild distress Eyes: PERRL ENT: Oral Mucosa clear moist. Neck: no  JVD Cardiovascular: S1 and S2 Present, no Murmur, Peripheral Pulses Present Respiratory: Bilateral Air entry equal and Decreased,  Clear to Auscultation, no Crackles, no wheezes Abdomen: Bowel Sound  present , Soft and non tender Skin: no Rash Extremities: no Pedal edema, no calf tenderness Neurologic: Grossly no focal neuro deficit.  Labs on Admission:  CBC:  Recent Labs Lab 01/04/15 1432 01/05/15 0345  WBC 11.2* 10.6*  NEUTROABS  --  8.1*  HGB 14.2 12.5*  HCT 40.5 36.5*  MCV 82.5 85.1  PLT 282 234    CMP     Component Value Date/Time   NA 133* 01/05/2015 0345   K 3.9 01/05/2015 0345   CL 96* 01/05/2015 0345   CO2 27 01/05/2015 0345   GLUCOSE 101* 01/05/2015 0345   BUN 5* 01/05/2015 0345   CREATININE 0.97 01/05/2015 0345   CREATININE 0.95 09/22/2014 1106   CALCIUM 7.7* 01/05/2015 0345   PROT 5.7* 01/05/2015 0345   ALBUMIN 2.9* 01/05/2015 0345   AST 54* 01/05/2015 0345   ALT 36 01/05/2015 0345   ALKPHOS 59 01/05/2015 0345   BILITOT 0.7 01/05/2015 0345   GFRNONAA >60 01/05/2015 0345   GFRNONAA 87 09/22/2014 1106   GFRAA >60 01/05/2015 0345   GFRAA >89 09/22/2014 1106     Recent Labs Lab 01/04/15 1432 01/05/15 0345  LIPASE 35 18*     Recent Labs Lab 01/04/15 1603 01/05/15 01/05/15 0345  TROPONINI <0.03 <0.03 <0.03   BNP (last 3 results)  Recent Labs  12/10/14 1339 01/04/15 1432  BNP 46.5 20.9    ProBNP (last 3 results)  Recent Labs  04/14/14 1911  PROBNP 49.4     Radiological Exams on Admission: Dg Chest 2 View  01/04/2015   CLINICAL DATA:  Chest pain. Initial encounter. Sharp substernal chest pain onset of symptoms 2 hr ago. Short of breath and nausea.  EXAM: CHEST  2 VIEW  COMPARISON:  12/10/2014.  FINDINGS: Cardiopericardial silhouette within normal limits. Mediastinal contours normal. Trachea midline. No airspace disease or effusion. Surgical clips in the upper abdomen. Truncation of the distal LEFT clavicle, likely postsurgical.   IMPRESSION: No active cardiopulmonary disease.   Electronically Signed   By: Dereck Ligas M.D.   On: 01/04/2015 15:20   Dg Abd Acute W/chest  01/04/2015   CLINICAL DATA:  Mid chest pain.  Symptoms for 1 week.  EXAM: DG ABDOMEN ACUTE W/ 1V CHEST  COMPARISON:  Plain films earlier today.  FINDINGS: Scattered air-fluid levels throughout small and large bowel without definite obstruction. Increased number of visible small bowel loops is also abnormal. No free air is observed. Surgical clips surround the GE junction. Radiodense material in LEFT upper quadrant likely ingested within the stomach. Heart size and mediastinal contours are within normal limits. Both lungs are clear.  IMPRESSION: Suspected ileus. No acute cardiopulmonary disease. Stable appearance of the chest from priors.   Electronically Signed   By: Rolla Flatten M.D.   On: 01/04/2015 20:01   EKG: Independently reviewed. normal sinus rhythm, nonspecific ST and T waves changes.  Assessment/Plan Principal Problem:   Suicidal ideation Active Problems:   Essential hypertension   COPD (chronic obstructive pulmonary disease)   Alcohol dependence   Chest pain   1. Suicidal ideation The patient is presenting with suicidal ideation. Psychiatric has been consulted and the patient Currently meets inpatient criteria. They will will follow the patient on regular basis. Suicidal precaution as well as one-to-one sitter.  2. chest pain. The patient is admitted in the hospital for medical clearance because of his complaints of chest pain. Cardiology was initially consulted who recommended over the phone to admit the patient to medicine service. Follow serial troponin and echocardiogram in the morning. Cardiology consult in the morning. Patient remains nothing by mouth after midnight.  3.Alcohol dependence. Patient will be placed on alcohol withdrawal prevention protocol. Thiamine as well as IV fluids.  4.History of COPD. Continuing home  inhalers.  5.Nausea vomiting and diarrhea. Probable gastroenteritis. Patient does not have any vomiting or diarrhea here. Symptomatic management at present.  Advance goals of care discussion: Full code    Consults: ED discussed with cardiology.  DVT Prophylaxis: subcutaneous Heparin Nutrition: Nothing by mouth except medications   Disposition: Admitted as inpatient, step-down unit.  Author: Berle Mull, MD [facility] Pager: [phone] 01/05/2015  If 7PM-7AM, please contact night-coverage [email or web address] Password [email or web address]

## 2015-01-05 NOTE — Clinical Social Work Note (Signed)
CSW Consult Acknowledged:   The Covering CSW received a consult for behavior issues impacting hospital stay. CSW informed the psych CSW regarding the consult. The Covering CSW will sign off.        Fox, MSW, [place]

## 2015-01-05 NOTE — Consult Note (Signed)
CARDIOLOGY CONSULT NOTE   Patient ID: Samuel Reyes MRN: [id number] DOB/AGE: 12-08-1955 59 y.o.  Admit Date: 01/04/2015  Primary Physician: Samuel Marek, MD  Primary Cardiologist     Samuel Reyes  Clinical Summary Samuel Reyes is a 59 y.o.male. He is admitted with chest pain that has been on and off for several days. He has significant problem with alcohol. In addition he has suicidal ideations that have been outlined elsewhere in this record. Cardiology is consulted to help with the assessment of his chest pain. Historically there is no proven coronary disease. Troponins are normal and EKG shows no significant abnormality. He has also had a complain of some abdominal discomfort.  The patient has significant medical problems including hypertension, COPD, history of cirrhosis, history of gastric ulcer, question of mitral valve prolapse (but there is no echo data available), history of a TIA and CVA, history of tobacco abuse, significant alcohol history which is ongoing.   Allergies  Allergen Reactions  . Bee Venom Anaphylaxis  . Penicillins Rash    Medications Scheduled Medications: . antiseptic oral rinse  7 mL Mouth Rinse BID  . aspirin EC  81 mg Oral Daily  . heparin  5,000 Units Subcutaneous 3 times per day  . ipratropium-albuterol  3 mL Inhalation BID  . metoprolol tartrate  25 mg Oral BID  . nicotine  21 mg Transdermal Q24H  . pantoprazole (PROTONIX) IV  40 mg Intravenous Q12H  . thiamine  100 mg Oral Daily  . trazodone  300 mg Oral QHS     Infusions:     PRN Medications:  acetaminophen, gi cocktail, LORazepam, morphine injection, ondansetron (ZOFRAN) IV   Past Medical History  Diagnosis Date  . Cirrhosis   . Degenerative joint disease   . Hypertension   . Gastric ulcer   . Depression   . Asthma   . Mitral valve prolapse 2002  . H/O hiatal hernia   . COPD (chronic obstructive pulmonary disease)   . TIA (transient ischemic attack)     2010  .  Stroke     TIA - 2010 - no deficits   . Shortness of breath     with exertion   . GERD (gastroesophageal reflux disease)   . Headache(784.0)   . Hiatal hernia 1982  . Degenerative joint disease   . Anxiety   . Alcohol abuse     Past Surgical History  Procedure Laterality Date  . Gastrectomy    . Shoulder surgery Bilateral     3 surgeries on on left, 2 surgeries on right   . Rt knee arthroscopic surgery    . Back surgery      3 cervical spine surgeries C4-C5 fused  . Hernia repair    . Finger surgery Left     2nd, 3rd, & 4th fingers were cut off by table saw and reattached  . Colonoscopy N/A 01/04/2014    Procedure: COLONOSCOPY;  Surgeon: Samuel Binder, MD;  Location: [facility];  Service: Endoscopy;  Laterality: N/A;  1:45  . Esophagogastroduodenoscopy N/A 01/04/2014    Procedure: ESOPHAGOGASTRODUODENOSCOPY (EGD);  Surgeon: Samuel Binder, MD;  Location: [facility];  Service: Endoscopy;  Laterality: N/A;  . Incisional hernia repair N/A 01/20/2014    Procedure: LAPAROSCOPIC RECURRENT  INCISIONAL HERNIA with mesh;  Surgeon: Samuel Jolly, MD;  Location: [facility];  Service: General;  Laterality: N/A;    Family History  Problem Relation Age of  Onset  . Cancer Father     bone  . Cancer Brother     lungs  . Stroke Maternal Grandmother   . Colon cancer Neg Hx   . Asthma Son     died at age 24 in his sleep   . Spina bifida Son     died at age 50     Social History Samuel Reyes reports that he has been smoking Cigarettes.  He started smoking about 48 years ago. He has a 45 pack-year smoking history. He has never used smokeless tobacco. Samuel Reyes reports that he drinks about 3.6 oz of alcohol per week.  Review of Systems This morning the patient denies fever, chills, headache, sweats, rash, change in vision, change in hearing, cough, urinary symptoms. All other systems are reviewed and are negative other than the history of present illness.  Physical Examination Blood  pressure 151/75, pulse 98, temperature 98.2 F (36.8 C), temperature source Oral, resp. rate 20, height 5\' 4"  (1.626 m), weight 144 lb 14.4 oz (65.726 kg), SpO2 96 %.  Intake/Output Summary (Last 24 hours) at 01/05/15 0802 Last data filed at 01/05/15 0659  Gross per 24 hour  Intake 1082.45 ml  Output      0 ml  Net 1082.45 ml    This morning the patient is oriented to person time and place. Affect is normal. Head is atraumatic. Sclera and conjunctiva are normal. There is no jugular venous distention. Lungs are clear. Respiratory effort is nonlabored. Cardiac exam reveals an S1 and S2. Abdomen is soft. There is no peripheral edema. There are no musculoskeletal deformities. There are no skin rashes. There is pain to mild palpation of the anterior chest.  Lab Results  Basic Metabolic Panel:  Recent Labs Lab 01/04/15 1432 01/05/15 0345  NA 126* 133*  K 3.3* 3.9  CL 83* 96*  CO2 24 27  GLUCOSE 96 101*  BUN <5* 5*  CREATININE 0.81 0.97  CALCIUM 8.2* 7.7*    Liver Function Tests:  Recent Labs Lab 01/04/15 1432 01/05/15 0345  AST 95* 54*  ALT 43 36  ALKPHOS 66 59  BILITOT 0.5 0.7  PROT 6.5 5.7*  ALBUMIN 3.4* 2.9*    CBC:  Recent Labs Lab 01/04/15 1432 01/05/15 0345  WBC 11.2* 10.6*  NEUTROABS  --  8.1*  HGB 14.2 12.5*  HCT 40.5 36.5*  MCV 82.5 85.1  PLT 282 234    Cardiac Enzymes:  Recent Labs Lab 01/04/15 1603 01/05/15 01/05/15 0345  TROPONINI <0.03 <0.03 <0.03    BNP: Invalid input(s): POCBNP   Radiology: Dg Chest 2 View  01/04/2015   CLINICAL DATA:  Chest pain. Initial encounter. Sharp substernal chest pain onset of symptoms 2 hr ago. Short of breath and nausea.  EXAM: CHEST  2 VIEW  COMPARISON:  12/10/2014.  FINDINGS: Cardiopericardial silhouette within normal limits. Mediastinal contours normal. Trachea midline. No airspace disease or effusion. Surgical clips in the upper abdomen. Truncation of the distal LEFT clavicle, likely postsurgical.   IMPRESSION: No active cardiopulmonary disease.   Electronically Signed   By: Dereck Ligas M.D.   On: 01/04/2015 15:20   Dg Abd Acute W/chest  01/04/2015   CLINICAL DATA:  Mid chest pain.  Symptoms for 1 week.  EXAM: DG ABDOMEN ACUTE W/ 1V CHEST  COMPARISON:  Plain films earlier today.  FINDINGS: Scattered air-fluid levels throughout small and large bowel without definite obstruction. Increased number of visible small bowel loops is also abnormal. No free  air is observed. Surgical clips surround the GE junction. Radiodense material in LEFT upper quadrant likely ingested within the stomach. Heart size and mediastinal contours are within normal limits. Both lungs are clear.  IMPRESSION: Suspected ileus. No acute cardiopulmonary disease. Stable appearance of the chest from priors.   Electronically Signed   By: Rolla Flatten M.D.   On: 01/04/2015 20:01     ECG: I have reviewed current and old EKGs. The EKG reveals no significant abnormality.  Telemetry:   I have reviewed telemetry today January 05, 2015. There is normal sinus rhythm with intermittent sinus tachycardia.   Impression and Recommendations     Suicidal ideation       This is being addressed by the primary care team    Essential hypertension   COPD (chronic obstructive pulmonary disease)       Alcohol dependence     This problem is being addressed by the primary care team.    Chest pain    At this time there is no evidence that his chest pain represents cardiac ischemia. EKG reveals no significant abnormality. Troponins are normal. There is pain to palpation of his chest. Two-dimensional echo has been ordered. This is reasonable to further assess LV function in this patient with alcohol dependency. It will also help with the assessment of his mitral valve, as there is question of history of mitral valve prolapse. I would recommend no further workup beyond these tests.  Daryel November, MD 01/05/2015, 8:02 AM

## 2015-01-06 ENCOUNTER — Encounter (HOSPITAL_COMMUNITY): Payer: Self-pay | Admitting: *Deleted

## 2015-01-06 MED ORDER — ACAMPROSATE CALCIUM 333 MG PO TBEC: 666.0000 mg | DELAYED_RELEASE_TABLET | Freq: Three times a day (TID) | ORAL | Status: DC

## 2015-01-06 MED ORDER — FLUOXETINE HCL 40 MG PO CAPS: 80.0000 mg | ORAL_CAPSULE | Freq: Every day | ORAL | Status: DC

## 2015-01-06 MED ORDER — PANTOPRAZOLE SODIUM 40 MG PO TBEC: 40.0000 mg | DELAYED_RELEASE_TABLET | Freq: Two times a day (BID) | ORAL | Status: DC

## 2015-01-06 MED ORDER — GABAPENTIN 300 MG PO CAPS: 300.0000 mg | ORAL_CAPSULE | Freq: Two times a day (BID) | ORAL | Status: DC

## 2015-01-06 MED ORDER — METOPROLOL TARTRATE 25 MG PO TABS: 25.0000 mg | ORAL_TABLET | Freq: Two times a day (BID) | ORAL | Status: DC

## 2015-01-06 MED ORDER — ALBUTEROL SULFATE HFA 108 (90 BASE) MCG/ACT IN AERS: 2.0000 | INHALATION_SPRAY | Freq: Four times a day (QID) | RESPIRATORY_TRACT | Status: DC | PRN

## 2015-01-06 NOTE — Progress Notes (Signed)
Nutrition Brief Note  Patient identified on the Malnutrition Screening Tool (MST) Report. Patient with weight fluctuations over the past year between 144 and 160 lbs.  Wt Readings from Last 15 Encounters:  01/05/15 144 lb 14.4 oz (65.726 kg)  12/10/14 154 lb 12.8 oz (70.217 kg)  09/22/14 158 lb 12.8 oz (72.031 kg)  08/31/14 160 lb (72.576 kg)  08/02/14 155 lb (70.308 kg)  07/23/14 156 lb 4.9 oz (70.9 kg)  04/15/14 150 lb (68.04 kg)  03/09/14 144 lb (65.318 kg)  02/24/14 154 lb 6.4 oz (70.035 kg)  01/21/14 160 lb (72.576 kg)  01/19/14 161 lb (73.029 kg)  01/04/14 155 lb (70.308 kg)  12/29/13 156 lb 12.8 oz (71.124 kg)  12/23/13 156 lb 6.4 oz (70.943 kg)  11/26/13 162 lb 9.6 oz (73.755 kg)    Body mass index is 24.86 kg/(m^2). Patient meets criteria for normal weight based on current BMI.   Current diet order is low sodium heart healthy, patient is consuming approximately 100% of meals at this time. Labs and medications reviewed. Plans for d/c home today.  No nutrition interventions warranted at this time. If nutrition issues arise, please consult RD.    Molli Barrows, RD, LDN, [place] Pager [phone] After Hours Pager [phone]

## 2015-01-06 NOTE — Clinical Social Work Psych Note (Signed)
Psych CSW received consult for suidical ideation.  Patient has been evaluated by psychiatry and the current disposition recommendation is home with outpatient follow-up.  Psych CSW will meet with patient today at 2pm to review resources.  RN updated and aware.  Nonnie Done, LCSW [phone]  Psychiatric & Orthopedics (5N 1-8) Clinical Social Worker

## 2015-01-06 NOTE — Care Management Note (Signed)
Case Management Note  Patient Details  Name: Samuel Reyes MRN: [id number] Date of Birth: 1956-04-06  Subjective/Objective:  Pt admitted for ETOH abuse. Initiated on CIWA Protocol.                   Action/Plan: CSW assisting with disposition needs. Psych has f/u. Plan was for [place], however pt to return home with his mother. No further needs at this time.    Expected Discharge Date:                  Expected Discharge Plan:  IP Rehab Facility  In-House Referral:  Clinical Social Work  Discharge planning Services  CM Consult  Post Acute Care Choice:    Choice offered to:     DME Arranged:    DME Agency:     HH Arranged:    [place] Agency:     Status of Service:  Completed, signed off  Medicare Important Message Given:  No Date Medicare IM Given:    Medicare IM give by:    Date Additional Medicare IM Given:    Additional Medicare Important Message give by:     If discussed at [place] of Stay Meetings, dates discussed:    Additional Comments:  Bethena Roys, RN 01/06/2015, 2:14 PM

## 2015-01-06 NOTE — Progress Notes (Signed)
UR Completed Fin Hupp Graves-Bigelow, RN,BSN [phone]  

## 2015-01-06 NOTE — Progress Notes (Signed)
[facility] PROGRESS NOTE Assessment/Plan:  Atypical chest pain: - He relates his pain is better, protonixs twice a day for 14 days and from there daily.  - I Have counseled him about avoiding NSAIDs including Goody powder.  Suicidal ideation: awaiting psyq recommendations.  Alcohol dependence: He was started on IV fluids thiamine and folate and monitor with CIWAwith no withdrawal symptoms.  History of COPD: Continue current medications.  Nausea and vomiting: Probably related to alcoholic gastritis. Currently symptom-free. He was started on IV fluid hydration. And he tolerated his diet.   Code Status: full Family Communication: none  Disposition Plan: home  today   Consultants:  psyq  Procedures:  CXR  abd x-ray  Antibiotics:  None  HPI/Subjective: No compalins  Objective: Filed Vitals:   01/05/15 2203 01/06/15 0413 01/06/15 0749 01/06/15 0843  BP:  159/83  121/71  Pulse:  81  85  Temp:  98.6 F (37 C)  98.6 F (37 C)  TempSrc:  Oral  Oral  Resp:  21  20  Height:      Weight:      SpO2: 98% 97% 97% 97%    Intake/Output Summary (Last 24 hours) at 01/06/15 0912 Last data filed at 01/06/15 0425  Gross per 24 hour  Intake    365 ml  Output      0 ml  Net    365 ml   Filed Weights   01/05/15 0404  Weight: 65.726 kg (144 lb 14.4 oz)    Exam:  General: Alert, awake, oriented x3, in no acute distress.  HEENT: No bruits, no goiter.  Heart: Regular rate and rhythm, without murmurs, rubs, gallops.  Lungs: Good air movement, clear Abdomen: Soft, nontender, nondistended, positive bowel sounds.  Neuro: Grossly intact, nonfocal.   Data Reviewed: Basic Metabolic Panel:  Recent Labs Lab 01/04/15 1432 01/05/15 0345  NA 126* 133*  K 3.3* 3.9  CL 83* 96*  CO2 24 27  GLUCOSE 96 101*  BUN <5* 5*  CREATININE 0.81 0.97  CALCIUM 8.2* 7.7*   Liver Function Tests:  Recent Labs Lab 01/04/15 1432 01/05/15 0345  AST 95* 54*  ALT 43  36  ALKPHOS 66 59  BILITOT 0.5 0.7  PROT 6.5 5.7*  ALBUMIN 3.4* 2.9*    Recent Labs Lab 01/04/15 1432 01/05/15 0345  LIPASE 35 18*   No results for input(s): AMMONIA in the last 168 hours. CBC:  Recent Labs Lab 01/04/15 1432 01/05/15 0345  WBC 11.2* 10.6*  NEUTROABS  --  8.1*  HGB 14.2 12.5*  HCT 40.5 36.5*  MCV 82.5 85.1  PLT 282 234   Cardiac Enzymes:  Recent Labs Lab 01/04/15 1603 01/05/15 01/05/15 0345 01/05/15 0952  TROPONINI <0.03 <0.03 <0.03 <0.03   BNP (last 3 results)  Recent Labs  12/10/14 1339 01/04/15 1432  BNP 46.5 20.9    ProBNP (last 3 results)  Recent Labs  04/14/14 1911  PROBNP 49.4    CBG: No results for input(s): GLUCAP in the last 168 hours.  No results found for this or any previous visit (from the past 240 hour(s)).   Studies: Dg Chest 2 View  01/04/2015   CLINICAL DATA:  Chest pain. Initial encounter. Sharp substernal chest pain onset of symptoms 2 hr ago. Short of breath and nausea.  EXAM: CHEST  2 VIEW  COMPARISON:  12/10/2014.  FINDINGS: Cardiopericardial silhouette within normal limits. Mediastinal contours normal. Trachea midline. No airspace disease or effusion. Surgical clips in the  upper abdomen. Truncation of the distal LEFT clavicle, likely postsurgical.  IMPRESSION: No active cardiopulmonary disease.   Electronically Signed   By: Dereck Ligas M.D.   On: 01/04/2015 15:20   Dg Abd Acute W/chest  01/04/2015   CLINICAL DATA:  Mid chest pain.  Symptoms for 1 week.  EXAM: DG ABDOMEN ACUTE W/ 1V CHEST  COMPARISON:  Plain films earlier today.  FINDINGS: Scattered air-fluid levels throughout small and large bowel without definite obstruction. Increased number of visible small bowel loops is also abnormal. No free air is observed. Surgical clips surround the GE junction. Radiodense material in LEFT upper quadrant likely ingested within the stomach. Heart size and mediastinal contours are within normal limits. Both lungs are  clear.  IMPRESSION: Suspected ileus. No acute cardiopulmonary disease. Stable appearance of the chest from priors.   Electronically Signed   By: Rolla Flatten M.D.   On: 01/04/2015 20:01    Scheduled Meds: . antiseptic oral rinse  7 mL Mouth Rinse BID  . aspirin EC  81 mg Oral Daily  . heparin  5,000 Units Subcutaneous 3 times per day  . ipratropium-albuterol  3 mL Inhalation BID  . metoprolol tartrate  25 mg Oral BID  . nicotine  21 mg Transdermal Q24H  . pantoprazole  40 mg Oral BID  . thiamine  100 mg Oral Daily  . trazodone  300 mg Oral QHS   Continuous Infusions:   Time Spent: 15 min   Charlynne Cousins  [facility] Pager [phone]. If 7PM-7AM, please contact night-coverage at [email or web address], password [facility] 01/06/2015, 9:12 AM  LOS: 1 day

## 2015-01-17 ENCOUNTER — Other Ambulatory Visit: Payer: Self-pay | Admitting: Internal Medicine

## 2015-01-18 ENCOUNTER — Emergency Department (HOSPITAL_COMMUNITY): Payer: Medicaid Other

## 2015-01-18 ENCOUNTER — Inpatient Hospital Stay (HOSPITAL_COMMUNITY): Payer: Medicaid Other

## 2015-01-18 ENCOUNTER — Encounter (HOSPITAL_COMMUNITY): Payer: Self-pay

## 2015-01-18 ENCOUNTER — Inpatient Hospital Stay (HOSPITAL_COMMUNITY)
Admission: EM | Admit: 2015-01-18 | Discharge: 2015-01-22 | DRG: 640 | Disposition: A | Discharge status: Home / Self Care | Payer: Medicaid Other | Attending: Internal Medicine | Admitting: Internal Medicine

## 2015-01-18 DIAGNOSIS — Z808 Family history of malignant neoplasm of other organs or systems: Secondary | ICD-10-CM

## 2015-01-18 DIAGNOSIS — I10 Essential (primary) hypertension: Secondary | ICD-10-CM | POA: Diagnosis present

## 2015-01-18 DIAGNOSIS — Z825 Family history of asthma and other chronic lower respiratory diseases: Secondary | ICD-10-CM

## 2015-01-18 DIAGNOSIS — G4089 Other seizures: Secondary | ICD-10-CM | POA: Diagnosis present

## 2015-01-18 DIAGNOSIS — E871 Hypo-osmolality and hyponatremia: Secondary | ICD-10-CM | POA: Diagnosis not present

## 2015-01-18 DIAGNOSIS — J45909 Unspecified asthma, uncomplicated: Secondary | ICD-10-CM | POA: Diagnosis present

## 2015-01-18 DIAGNOSIS — K7460 Unspecified cirrhosis of liver: Secondary | ICD-10-CM | POA: Diagnosis present

## 2015-01-18 DIAGNOSIS — R569 Unspecified convulsions: Secondary | ICD-10-CM | POA: Diagnosis not present

## 2015-01-18 DIAGNOSIS — Z880 Allergy status to penicillin: Secondary | ICD-10-CM

## 2015-01-18 DIAGNOSIS — Z823 Family history of stroke: Secondary | ICD-10-CM | POA: Diagnosis not present

## 2015-01-18 DIAGNOSIS — Z791 Long term (current) use of non-steroidal anti-inflammatories (NSAID): Secondary | ICD-10-CM

## 2015-01-18 DIAGNOSIS — Y900 Blood alcohol level of less than 20 mg/100 ml: Secondary | ICD-10-CM | POA: Diagnosis present

## 2015-01-18 DIAGNOSIS — I341 Nonrheumatic mitral (valve) prolapse: Secondary | ICD-10-CM | POA: Diagnosis present

## 2015-01-18 DIAGNOSIS — F329 Major depressive disorder, single episode, unspecified: Secondary | ICD-10-CM | POA: Diagnosis present

## 2015-01-18 DIAGNOSIS — M1990 Unspecified osteoarthritis, unspecified site: Secondary | ICD-10-CM | POA: Diagnosis present

## 2015-01-18 DIAGNOSIS — Z801 Family history of malignant neoplasm of trachea, bronchus and lung: Secondary | ICD-10-CM

## 2015-01-18 DIAGNOSIS — F17210 Nicotine dependence, cigarettes, uncomplicated: Secondary | ICD-10-CM | POA: Diagnosis present

## 2015-01-18 DIAGNOSIS — Z79899 Other long term (current) drug therapy: Secondary | ICD-10-CM

## 2015-01-18 DIAGNOSIS — J449 Chronic obstructive pulmonary disease, unspecified: Secondary | ICD-10-CM | POA: Diagnosis present

## 2015-01-18 DIAGNOSIS — F419 Anxiety disorder, unspecified: Secondary | ICD-10-CM | POA: Diagnosis present

## 2015-01-18 DIAGNOSIS — E876 Hypokalemia: Secondary | ICD-10-CM | POA: Diagnosis present

## 2015-01-18 DIAGNOSIS — Z8711 Personal history of peptic ulcer disease: Secondary | ICD-10-CM | POA: Diagnosis not present

## 2015-01-18 DIAGNOSIS — Z6824 Body mass index (BMI) 24.0-24.9, adult: Secondary | ICD-10-CM | POA: Diagnosis not present

## 2015-01-18 DIAGNOSIS — Z91030 Bee allergy status: Secondary | ICD-10-CM | POA: Diagnosis not present

## 2015-01-18 DIAGNOSIS — K219 Gastro-esophageal reflux disease without esophagitis: Secondary | ICD-10-CM | POA: Diagnosis present

## 2015-01-18 DIAGNOSIS — F10239 Alcohol dependence with withdrawal, unspecified: Secondary | ICD-10-CM | POA: Diagnosis present

## 2015-01-18 DIAGNOSIS — E43 Unspecified severe protein-calorie malnutrition: Secondary | ICD-10-CM | POA: Diagnosis present

## 2015-01-18 DIAGNOSIS — Z8673 Personal history of transient ischemic attack (TIA), and cerebral infarction without residual deficits: Secondary | ICD-10-CM

## 2015-01-18 DIAGNOSIS — R1013 Epigastric pain: Secondary | ICD-10-CM

## 2015-01-18 LAB — BASIC METABOLIC PANEL
ANION GAP: 11 (ref 5–15)
BUN: 10 mg/dL (ref 6–20)
BUN: 8 mg/dL (ref 6–20)
CALCIUM: 7.5 mg/dL — AB (ref 8.9–10.3)
CO2: 43 mmol/L — AB (ref 22–32)
CO2: 42 mmol/L — ABNORMAL HIGH (ref 22–32)
CREATININE: 0.74 mg/dL (ref 0.61–1.24)
Calcium: 7.5 mg/dL — ABNORMAL LOW (ref 8.9–10.3)
Chloride: <65 mmol/L — CL (ref 101–111)
Chloride: 67 mmol/L — ABNORMAL LOW (ref 101–111)
Creatinine, Ser: 0.76 mg/dL (ref 0.61–1.24)
GFR calc non Af Amer: >60 mL/min (ref >60)
GFR calc non Af Amer: >60 mL/min (ref >60)
GLUCOSE: 93 mg/dL (ref 65–99)
Glucose, Bld: 96 mg/dL (ref 65–99)
Potassium: <2 mmol/L — CL (ref 3.5–5.1)
Potassium: <2 mmol/L — CL (ref 3.5–5.1)
SODIUM: 120 mmol/L — AB (ref 135–145)
Sodium: 117 mmol/L — CL (ref 135–145)

## 2015-01-18 LAB — COMPREHENSIVE METABOLIC PANEL
ALT: 28 U/L (ref 17–63)
AST: 26 U/L (ref 15–41)
Albumin: 2.9 g/dL — ABNORMAL LOW (ref 3.5–5.0)
Alkaline Phosphatase: 60 U/L (ref 38–126)
BUN: 11 mg/dL (ref 6–20)
CO2: 41 mmol/L — ABNORMAL HIGH (ref 22–32)
Calcium: 7.1 mg/dL — ABNORMAL LOW (ref 8.9–10.3)
Chloride: <65 mmol/L — CL (ref 101–111)
Creatinine, Ser: 0.75 mg/dL (ref 0.61–1.24)
GFR calc Af Amer: >60 mL/min (ref >60)
GFR calc non Af Amer: >60 mL/min (ref >60)
Glucose, Bld: 113 mg/dL — ABNORMAL HIGH (ref 65–99)
Potassium: <2 mmol/L — CL (ref 3.5–5.1)
Sodium: 114 mmol/L — CL (ref 135–145)
Total Bilirubin: 0.8 mg/dL (ref 0.3–1.2)
Total Protein: 5.6 g/dL — ABNORMAL LOW (ref 6.5–8.1)

## 2015-01-18 LAB — PHOSPHORUS: Phosphorus: 2.4 mg/dL — ABNORMAL LOW (ref 2.5–4.6)

## 2015-01-18 LAB — CBC WITH DIFFERENTIAL/PLATELET
Basophils Absolute: 0 10*3/uL (ref 0.0–0.1)
Basophils Relative: 0 % (ref 0–1)
Eosinophils Absolute: 0 10*3/uL (ref 0.0–0.7)
Eosinophils Relative: 0 % (ref 0–5)
HCT: 39 % (ref 39.0–52.0)
Hemoglobin: 14.3 g/dL (ref 13.0–17.0)
Lymphocytes Relative: 13 % (ref 12–46)
Lymphs Abs: 1.5 10*3/uL (ref 0.7–4.0)
MCH: 29.7 pg (ref 26.0–34.0)
MCHC: 36.7 g/dL — ABNORMAL HIGH (ref 30.0–36.0)
MCV: 80.9 fL (ref 78.0–100.0)
Monocytes Absolute: 0.4 10*3/uL (ref 0.1–1.0)
Monocytes Relative: 4 % (ref 3–12)
Neutro Abs: 9.3 10*3/uL — ABNORMAL HIGH (ref 1.7–7.7)
Neutrophils Relative %: 83 % — ABNORMAL HIGH (ref 43–77)
Platelets: 194 10*3/uL (ref 150–400)
RBC: 4.82 MIL/uL (ref 4.22–5.81)
RDW: 16.2 % — ABNORMAL HIGH (ref 11.5–15.5)
WBC: 11.2 10*3/uL — ABNORMAL HIGH (ref 4.0–10.5)

## 2015-01-18 LAB — LIPASE, BLOOD: Lipase: 12 U/L — ABNORMAL LOW (ref 22–51)

## 2015-01-18 LAB — ETHANOL

## 2015-01-18 LAB — TSH: TSH: 0.962 u[IU]/mL (ref 0.350–4.500)

## 2015-01-18 LAB — MRSA PCR SCREENING: MRSA BY PCR: POSITIVE — AB

## 2015-01-18 LAB — MAGNESIUM: Magnesium: 2.1 mg/dL (ref 1.7–2.4)

## 2015-01-18 MED ORDER — NICOTINE 21 MG/24HR TD PT24
21.0000 mg | MEDICATED_PATCH | Freq: Every day | TRANSDERMAL | Status: DC
  Administered 2015-01-18 – 2015-01-22 (×5): 21 mg via TRANSDERMAL
  Filled 2015-01-18 (×5): qty 1

## 2015-01-18 MED ORDER — ONDANSETRON HCL 4 MG/2ML IJ SOLN: 4.0000 mg | Freq: Four times a day (QID) | INTRAMUSCULAR | Status: DC | PRN

## 2015-01-18 MED ORDER — SODIUM CHLORIDE 3 % IV SOLN
INTRAVENOUS | Status: DC
  Filled 2015-01-18: qty 500

## 2015-01-18 MED ORDER — FLUOXETINE HCL 20 MG PO CAPS
80.0000 mg | ORAL_CAPSULE | Freq: Every day | ORAL | Status: DC
  Administered 2015-01-18 – 2015-01-22 (×5): 80 mg via ORAL
  Filled 2015-01-18 (×5): qty 4

## 2015-01-18 MED ORDER — SODIUM CHLORIDE 0.9 % IV BOLUS (SEPSIS)
1000.0000 mL | Freq: Once | INTRAVENOUS | Status: AC
  Administered 2015-01-18: 1000 mL via INTRAVENOUS

## 2015-01-18 MED ORDER — IPRATROPIUM-ALBUTEROL 18-103 MCG/ACT IN AERO
2.0000 | INHALATION_SPRAY | Freq: Two times a day (BID) | RESPIRATORY_TRACT | Status: DC
Start: 2015-01-18 — End: 2015-01-18

## 2015-01-18 MED ORDER — PANTOPRAZOLE SODIUM 40 MG PO TBEC
40.0000 mg | DELAYED_RELEASE_TABLET | Freq: Two times a day (BID) | ORAL | Status: DC
  Administered 2015-01-18 – 2015-01-22 (×8): 40 mg via ORAL
  Filled 2015-01-18 (×9): qty 1

## 2015-01-18 MED ORDER — TRAZODONE HCL 100 MG PO TABS
300.0000 mg | ORAL_TABLET | Freq: Every day | ORAL | Status: DC
  Administered 2015-01-18 – 2015-01-21 (×4): 300 mg via ORAL
  Filled 2015-01-18: qty 6
  Filled 2015-01-18: qty 2
  Filled 2015-01-18: qty 6
  Filled 2015-01-18 (×2): qty 3

## 2015-01-18 MED ORDER — THIAMINE HCL 100 MG/ML IJ SOLN: 100.0000 mg | Freq: Every day | INTRAMUSCULAR | Status: DC

## 2015-01-18 MED ORDER — OXYCODONE HCL 5 MG PO TABS
5.0000 mg | ORAL_TABLET | ORAL | Status: DC | PRN
  Administered 2015-01-20: 5 mg via ORAL
  Filled 2015-01-18: qty 1

## 2015-01-18 MED ORDER — SODIUM CHLORIDE 0.9 % IJ SOLN
3.0000 mL | Freq: Two times a day (BID) | INTRAMUSCULAR | Status: DC
  Administered 2015-01-18 – 2015-01-21 (×7): 3 mL via INTRAVENOUS

## 2015-01-18 MED ORDER — ADULT MULTIVITAMIN W/MINERALS CH: 1.0000 | ORAL_TABLET | Freq: Every day | ORAL | Status: DC

## 2015-01-18 MED ORDER — POTASSIUM CHLORIDE 10 MEQ/100ML IV SOLN
10.0000 meq | INTRAVENOUS | Status: AC
  Administered 2015-01-19 (×2): 10 meq via INTRAVENOUS

## 2015-01-18 MED ORDER — IPRATROPIUM-ALBUTEROL 0.5-2.5 (3) MG/3ML IN SOLN
3.0000 mL | Freq: Two times a day (BID) | RESPIRATORY_TRACT | Status: DC
  Administered 2015-01-18 – 2015-01-22 (×8): 3 mL via RESPIRATORY_TRACT
  Filled 2015-01-18 (×8): qty 3

## 2015-01-18 MED ORDER — LORAZEPAM 1 MG PO TABS: 1.0000 mg | ORAL_TABLET | Freq: Four times a day (QID) | ORAL | Status: AC | PRN

## 2015-01-18 MED ORDER — POTASSIUM CHLORIDE 10 MEQ/100ML IV SOLN
10.0000 meq | INTRAVENOUS | Status: DC
  Administered 2015-01-18 (×2): 10 meq via INTRAVENOUS
  Filled 2015-01-18 (×2): qty 100

## 2015-01-18 MED ORDER — POTASSIUM CHLORIDE 10 MEQ/100ML IV SOLN
10.0000 meq | INTRAVENOUS | Status: AC
  Administered 2015-01-18 – 2015-01-19 (×6): 10 meq via INTRAVENOUS
  Filled 2015-01-18 (×2): qty 100

## 2015-01-18 MED ORDER — HEPARIN SODIUM (PORCINE) 5000 UNIT/ML IJ SOLN
5000.0000 [IU] | Freq: Three times a day (TID) | INTRAMUSCULAR | Status: DC
  Administered 2015-01-18 – 2015-01-22 (×11): 5000 [IU] via SUBCUTANEOUS
  Filled 2015-01-18 (×14): qty 1

## 2015-01-18 MED ORDER — ACAMPROSATE CALCIUM 333 MG PO TBEC
666.0000 mg | DELAYED_RELEASE_TABLET | Freq: Three times a day (TID) | ORAL | Status: DC
  Administered 2015-01-19 – 2015-01-22 (×11): 666 mg via ORAL
  Filled 2015-01-18 (×17): qty 2

## 2015-01-18 MED ORDER — FOLIC ACID 1 MG PO TABS: 1.0000 mg | ORAL_TABLET | Freq: Every day | ORAL | Status: DC

## 2015-01-18 MED ORDER — HYDROMORPHONE HCL 1 MG/ML IJ SOLN
1.0000 mg | Freq: Once | INTRAMUSCULAR | Status: AC
  Administered 2015-01-18: 1 mg via INTRAVENOUS
  Filled 2015-01-18: qty 1

## 2015-01-18 MED ORDER — ADULT MULTIVITAMIN W/MINERALS CH
1.0000 | ORAL_TABLET | Freq: Every day | ORAL | Status: DC
  Administered 2015-01-18 – 2015-01-22 (×5): 1 via ORAL
  Filled 2015-01-18 (×5): qty 1

## 2015-01-18 MED ORDER — VITAMIN B-1 100 MG PO TABS
100.0000 mg | ORAL_TABLET | Freq: Every day | ORAL | Status: DC
Start: 2015-01-18 — End: 2015-01-18

## 2015-01-18 MED ORDER — SODIUM CHLORIDE 0.9 % IV SOLN
INTRAVENOUS | Status: DC
  Administered 2015-01-18: 20:00:00 via INTRAVENOUS

## 2015-01-18 MED ORDER — LORAZEPAM 2 MG/ML IJ SOLN
1.0000 mg | Freq: Once | INTRAMUSCULAR | Status: AC
  Administered 2015-01-18: 1 mg via INTRAVENOUS
  Filled 2015-01-18: qty 1

## 2015-01-18 MED ORDER — ONDANSETRON HCL 4 MG PO TABS: 4.0000 mg | ORAL_TABLET | Freq: Four times a day (QID) | ORAL | Status: DC | PRN

## 2015-01-18 MED ORDER — GABAPENTIN 300 MG PO CAPS
300.0000 mg | ORAL_CAPSULE | Freq: Two times a day (BID) | ORAL | Status: DC
  Administered 2015-01-18 – 2015-01-22 (×8): 300 mg via ORAL
  Filled 2015-01-18 (×9): qty 1

## 2015-01-18 MED ORDER — VITAMIN B-1 100 MG PO TABS
100.0000 mg | ORAL_TABLET | Freq: Every day | ORAL | Status: DC
  Administered 2015-01-18 – 2015-01-22 (×5): 100 mg via ORAL
  Filled 2015-01-18 (×5): qty 1

## 2015-01-18 MED ORDER — METOPROLOL TARTRATE 25 MG PO TABS
25.0000 mg | ORAL_TABLET | Freq: Two times a day (BID) | ORAL | Status: DC
  Administered 2015-01-18 – 2015-01-20 (×5): 25 mg via ORAL
  Filled 2015-01-18 (×7): qty 1

## 2015-01-18 MED ORDER — LORAZEPAM 2 MG/ML IJ SOLN: 1.0000 mg | Freq: Four times a day (QID) | INTRAMUSCULAR | Status: AC | PRN

## 2015-01-18 MED ORDER — FOLIC ACID 1 MG PO TABS
1.0000 mg | ORAL_TABLET | Freq: Every day | ORAL | Status: DC
  Administered 2015-01-18 – 2015-01-22 (×5): 1 mg via ORAL
  Filled 2015-01-18 (×5): qty 1

## 2015-01-18 MED ORDER — ONDANSETRON HCL 4 MG/2ML IJ SOLN
4.0000 mg | Freq: Once | INTRAMUSCULAR | Status: AC
  Administered 2015-01-18: 4 mg via INTRAVENOUS
  Filled 2015-01-18: qty 2

## 2015-01-18 NOTE — Consult Note (Addendum)
Reason for Consult:Seizure Referring Physician: Darl Householder  CC: Seizure  HPI: Samuel Reyes is an 59 y.o. male with a history of alcohol abuse who reports that he has been feeling poorly for the past 3-4 days (chest pain, nausea, vomiting, diarrhea).  Today EMS was called.  On their arrival patient was noted to have a generalized tonic-clonic seizure.  Patient was brought in for evaluation at that time.  Reports no previous history of seizure.    Past Medical History  Diagnosis Date  . Cirrhosis   . Degenerative joint disease   . Hypertension   . Gastric ulcer   . Depression   . Asthma   . Mitral valve prolapse 2002  . H/O hiatal hernia   . COPD (chronic obstructive pulmonary disease)   . TIA (transient ischemic attack)     2010  . Stroke     TIA - 2010 - no deficits   . Shortness of breath     with exertion   . GERD (gastroesophageal reflux disease)   . Headache(784.0)   . Hiatal hernia 1982  . Degenerative joint disease   . Anxiety   . Alcohol abuse     Past Surgical History  Procedure Laterality Date  . Gastrectomy    . Shoulder surgery Bilateral     3 surgeries on on left, 2 surgeries on right   . Rt knee arthroscopic surgery    . Back surgery      3 cervical spine surgeries C4-C5 fused  . Hernia repair    . Finger surgery Left     2nd, 3rd, & 4th fingers were cut off by table saw and reattached  . Colonoscopy N/A 01/04/2014    Procedure: COLONOSCOPY;  Surgeon: Danie Binder, MD;  Location: [facility];  Service: Endoscopy;  Laterality: N/A;  1:45  . Esophagogastroduodenoscopy N/A 01/04/2014    Procedure: ESOPHAGOGASTRODUODENOSCOPY (EGD);  Surgeon: Danie Binder, MD;  Location: [facility];  Service: Endoscopy;  Laterality: N/A;  . Incisional hernia repair N/A 01/20/2014    Procedure: LAPAROSCOPIC RECURRENT  INCISIONAL HERNIA with mesh;  Surgeon: Edward Jolly, MD;  Location: [facility];  Service: General;  Laterality: N/A;    Family History  Problem Relation Age  of Onset  . Cancer Father     bone  . Cancer Brother     lungs  . Stroke Maternal Grandmother   . Colon cancer Neg Hx   . Asthma Son     died at age 8 in his sleep   . Spina bifida Son     died at age 43     Social History:  reports that he has been smoking Cigarettes.  He started smoking about 48 years ago. He has a 45 pack-year smoking history. He has never used smokeless tobacco. He reports that he drinks about 4-5 40oz of alcohol per day. He reports that he does not use illicit drugs.  Allergies  Allergen Reactions  . Bee Venom Anaphylaxis  . Penicillins Rash    Medications: I have reviewed the patient's current medications. Prior to Admission:  Current outpatient prescriptions:  .  albuterol-ipratropium (COMBIVENT) 18-103 MCG/ACT inhaler, Inhale 2 puffs into the lungs 2 (two) times daily. For shortness of breath, Disp: 1 Inhaler, Rfl: 5 .  metoprolol tartrate (LOPRESSOR) 25 MG tablet, Take 1 tablet (25 mg total) by mouth 2 (two) times daily. For high blood pressure, Disp: 60 tablet, Rfl: 2 .  nicotine (NICODERM CQ - DOSED  IN MG/24 HOURS) 21 mg/24hr patch, PLACE 1 PATCH ONTO THE SKIN DAILY, Disp: 28 patch, Rfl: 1 .  trazodone (DESYREL) 150 MG tablet, Take 2 tablets (300 mg total) by mouth at bedtime. For sleep, Disp: 60 tablet, Rfl: 0 .  acamprosate (CAMPRAL) 333 MG tablet, Take 2 tablets (666 mg total) by mouth 3 (three) times daily with meals. For alcoholism (Patient not taking: Reported on 01/18/2015), Disp: 60 tablet, Rfl: 0 .  albuterol (PROVENTIL HFA;VENTOLIN HFA) 108 (90 BASE) MCG/ACT inhaler, Inhale 2 puffs into the lungs every 6 (six) hours as needed for wheezing or shortness of breath. (Patient not taking: Reported on 01/18/2015), Disp: 1 Inhaler, Rfl: 5 .  aspirin 81 MG chewable tablet, Chew 1 tablet (81 mg total) by mouth daily. (Patient not taking: Reported on 12/10/2014), Disp: , Rfl:  .  FLUoxetine (PROZAC) 40 MG capsule, Take 2 capsules (80 mg total) by mouth daily.  For depression (Patient not taking: Reported on 01/18/2015), Disp: 60 capsule, Rfl: 0 .  gabapentin (NEURONTIN) 300 MG capsule, Take 1 capsule (300 mg total) by mouth 2 (two) times daily. For substance withdrawal syndrome (Patient not taking: Reported on 01/18/2015), Disp: 60 capsule, Rfl: 0 .  naproxen (NAPROSYN) 500 MG tablet, Take 500 mg by mouth 2 (two) times daily with a meal., Disp: , Rfl:  .  pantoprazole (PROTONIX) 40 MG tablet, Take 1 tablet (40 mg total) by mouth 2 (two) times daily. (Patient not taking: Reported on 01/18/2015), Disp: 60 tablet, Rfl: 3  ROS: History obtained from the patient  General ROS: negative for - chills, fatigue, fever, night sweats, weight gain or weight loss Psychological ROS: negative for - behavioral disorder, hallucinations, memory difficulties, mood swings or suicidal ideation Ophthalmic ROS: negative for - blurry vision, double vision, eye pain or loss of vision ENT ROS: negative for - epistaxis, nasal discharge, oral lesions, sore throat, tinnitus or vertigo Allergy and Immunology ROS: negative for - hives or itchy/watery eyes Hematological and Lymphatic ROS: negative for - bleeding problems, bruising or swollen lymph nodes Endocrine ROS: negative for - galactorrhea, hair pattern changes, polydipsia/polyuria or temperature intolerance Respiratory ROS: negative for - cough, hemoptysis, shortness of breath or wheezing Cardiovascular ROS: as noted in HPI Gastrointestinal ROS: as noted in HPI Genito-Urinary ROS: negative for - dysuria, hematuria, incontinence or urinary frequency/urgency Musculoskeletal ROS: negative for - joint swelling or muscular weakness Neurological ROS: as noted in HPI Dermatological ROS: negative for rash and skin lesion changes  Physical Examination: Blood pressure 109/63, pulse 74, temperature 98 F (36.7 C), temperature source Oral, resp. rate 16, SpO2 98 %.  HEENT-  Normocephalic, no lesions, without obvious abnormality.   Normal external eye and conjunctiva.  Normal TM's bilaterally.  Normal auditory canals and external ears. Normal external nose, mucus membranes and septum.  Normal pharynx. Cardiovascular- S1, S2 normal, pulses palpable throughout   Lungs- chest clear, no wheezing, rales, normal symmetric air entry Abdomen- soft, non-tender; bowel sounds normal; no masses,  no organomegaly Extremities- no edema Lymph-no adenopathy palpable Musculoskeletal-no joint tenderness, deformity or swelling Skin-warm and dry, no hyperpigmentation, vitiligo, or suspicious lesions  Neurological Examination Mental Status: Lethargic, oriented, thought content appropriate.  Speech fluent without evidence of aphasia.  Able to follow 3 step commands without difficulty. Cranial Nerves: II: Discs flat bilaterally; Visual fields grossly normal, pupils equal, round, reactive to light and accommodation III,IV, VI: ptosis not present, extra-ocular motions intact bilaterally V,VII: smile symmetric, facial light touch sensation decreased on the right side  of the face VIII: hearing normal bilaterally IX,X: gag reflex present XI: bilateral shoulder shrug XII: midline tongue extension Motor: Gives very little resistance but lifts all extremties against gravity and shows no focal weakness.   Sensory: Pinprick and light touch decreased in the RUE Deep Tendon Reflexes: 2+ and symmetric throughout Plantars: Right: upgoing   Left: upgoing Cerebellar: normal finger-to-nose and normal heel-to-shin testing bilaterally Gait: not tested due to safety concerns    Laboratory Studies:   Basic Metabolic Panel:  Recent Labs Lab 01/18/15 1606  NA 114*  K <2.0*  CL <65*  CO2 41*  GLUCOSE 113*  BUN 11  CREATININE 0.75  CALCIUM 7.1*    Liver Function Tests:  Recent Labs Lab 01/18/15 1606  AST 26  ALT 28  ALKPHOS 60  BILITOT 0.8  PROT 5.6*  ALBUMIN 2.9*    Recent Labs Lab 01/18/15 1606  LIPASE 12*   No results for  input(s): AMMONIA in the last 168 hours.  CBC:  Recent Labs Lab 01/18/15 1437  WBC 11.2*  NEUTROABS 9.3*  HGB 14.3  HCT 39.0  MCV 80.9  PLT 194    Cardiac Enzymes: No results for input(s): CKTOTAL, CKMB, CKMBINDEX, TROPONINI in the last 168 hours.  BNP: Invalid input(s): POCBNP  CBG: No results for input(s): GLUCAP in the last 168 hours.  Microbiology: Results for orders placed or performed during the hospital encounter of 08/31/14  Blood culture (routine x 2)     Status: None   Collection Time: 08/31/14  6:40 AM  Result Value Ref Range Status   Specimen Description BLOOD ARM LEFT  Final   Special Requests BOTTLES DRAWN AEROBIC AND ANAEROBIC 10CC  Final   Culture   Final    NO GROWTH 5 DAYS Performed at [facility]    Report Status 09/06/2014 FINAL  Final  Blood culture (routine x 2)     Status: None   Collection Time: 08/31/14  6:45 AM  Result Value Ref Range Status   Specimen Description BLOOD HAND LEFT  Final   Special Requests BOTTLES DRAWN AEROBIC ONLY 10CC  Final   Culture   Final    NO GROWTH 5 DAYS Performed at [facility]    Report Status 09/06/2014 FINAL  Final    Coagulation Studies: No results for input(s): LABPROT, INR in the last 72 hours.  Urinalysis: No results for input(s): COLORURINE, LABSPEC, PHURINE, GLUCOSEU, HGBUR, BILIRUBINUR, KETONESUR, PROTEINUR, UROBILINOGEN, NITRITE, LEUKOCYTESUR in the last 168 hours.  Invalid input(s): APPERANCEUR  Lipid Panel:     Component Value Date/Time   CHOL 247* 02/17/2013 1541   TRIG 144 02/17/2013 1541   HDL 79 02/17/2013 1541   CHOLHDL 3.1 02/17/2013 1541   VLDL 29 02/17/2013 1541   LDLCALC 139* 02/17/2013 1541    HgbA1C: No results found for: HGBA1C  Urine Drug Screen:     Component Value Date/Time   LABOPIA NONE DETECTED 01/04/2015 1803   COCAINSCRNUR NONE DETECTED 01/04/2015 1803   LABBENZ NONE DETECTED 01/04/2015 1803   AMPHETMU NONE DETECTED 01/04/2015 1803   THCU  NONE DETECTED 01/04/2015 1803   LABBARB NONE DETECTED 01/04/2015 1803    Alcohol Level:  Recent Labs Lab 01/18/15 [place] <5    Other results: EKG: sinus rhythm at 83 bpm.  Imaging: Ct Head Wo Contrast  01/18/2015   CLINICAL DATA:  New onset grand mal seizure. Dizziness and headache postictal  EXAM: CT HEAD WITHOUT CONTRAST  TECHNIQUE: Contiguous axial images  were obtained from the base of the skull through the vertex without intravenous contrast.  COMPARISON:  None.  FINDINGS: There is mild diffuse atrophy. There is no intracranial mass, hemorrhage, extra-axial fluid collection, or midline shift. Gray-white compartments are normal. No acute infarct apparent. Bony calvarium appears intact. The mastoid air cells are clear.  IMPRESSION: Mild diffuse atrophy.  Study otherwise unremarkable.   Electronically Signed   By: Lowella Grip III M.D.   On: 01/18/2015 14:53     Assessment/Plan: 59 year old male with new onset seizure.  Patient was drinking heavily as recently as yesterday making an alcohol withdrawal seizure less likely.  The patient does have multiple metabolic issues including a low sodium therefore his seizure was likely provoked.  Head CT personally reviewed and shows no acute changes.  Patient does have some complaints of numbness on examination that are new though.  Further work up recommended.  Would not start chronic AED therapy at this time.    Recommendations: 1.  Seizure precautions 2.  Ativan prn, CIWA protocol 3.  Sodium repletion 4.  Serum magnesium and phosphorus 5.  EEG 6.  MRI of the brain with and without contrast   Alexis Goodell, MD [facility] [phone] 01/18/2015, 6:06 PM

## 2015-01-18 NOTE — ED Notes (Signed)
Per EMS- Patient originally called for c/o upper abdominal pain and had a 30 second grand mal seizure witnessed by EMS. Patient was seen at [facility] last week and was diagnosed with gastritis. Patient admitted prior to seizure that he drank 5 40 ounce beers yesterday.

## 2015-01-18 NOTE — Progress Notes (Signed)
CRITICAL VALUE ALERT  Critical value received:  K+ < 2.0  Date of notification:  6/21  Time of notification:  2215  Critical value read back:Yes.    Nurse who received alert:  Benjamin Stain, RN  MD notified (1st page):  [facility] on call   Time of first page:  2230

## 2015-01-18 NOTE — ED Notes (Signed)
Bed: HD62 Expected date:  Expected time:  Means of arrival:  Comments: EMS- 59yo M, abdominal pain, seizure

## 2015-01-18 NOTE — ED Provider Notes (Signed)
CSN: [id number]     Arrival date & time 01/18/15  1333 History   First MD Initiated Contact with Patient 01/18/15 1355     Chief Complaint  Patient presents with  . Seizures     (Consider location/radiation/quality/duration/timing/severity/associated sxs/prior Treatment) HPI   58yM with abdominal pain. Epigastric. Worsening over past day. Hx of etoh abuse. Last drank several 40's yesterday. Constant. Doesn't radiate. Associated with n/v. No fever or chills. EMS called and report he had generalized seizure-like activity just as they arrived. No incontinence. No oral trauma. Pt denies hx of seizure. No fever or chills.   Past Medical History  Diagnosis Date  . Cirrhosis   . Degenerative joint disease   . Hypertension   . Gastric ulcer   . Depression   . Asthma   . Mitral valve prolapse 2002  . H/O hiatal hernia   . COPD (chronic obstructive pulmonary disease)   . TIA (transient ischemic attack)     2010  . Stroke     TIA - 2010 - no deficits   . Shortness of breath     with exertion   . GERD (gastroesophageal reflux disease)   . Headache(784.0)   . Hiatal hernia 1982  . Degenerative joint disease   . Anxiety   . Alcohol abuse    Past Surgical History  Procedure Laterality Date  . Gastrectomy    . Shoulder surgery Bilateral     3 surgeries on on left, 2 surgeries on right   . Rt knee arthroscopic surgery    . Back surgery      3 cervical spine surgeries C4-C5 fused  . Hernia repair    . Finger surgery Left     2nd, 3rd, & 4th fingers were cut off by table saw and reattached  . Colonoscopy N/A 01/04/2014    Procedure: COLONOSCOPY;  Surgeon: Danie Binder, MD;  Location: [facility];  Service: Endoscopy;  Laterality: N/A;  1:45  . Esophagogastroduodenoscopy N/A 01/04/2014    Procedure: ESOPHAGOGASTRODUODENOSCOPY (EGD);  Surgeon: Danie Binder, MD;  Location: [facility];  Service: Endoscopy;  Laterality: N/A;  . Incisional hernia repair N/A 01/20/2014    Procedure:  LAPAROSCOPIC RECURRENT  INCISIONAL HERNIA with mesh;  Surgeon: Edward Jolly, MD;  Location: [facility];  Service: General;  Laterality: N/A;   Family History  Problem Relation Age of Onset  . Cancer Father     bone  . Cancer Brother     lungs  . Stroke Maternal Grandmother   . Colon cancer Neg Hx   . Asthma Son     died at age 36 in his sleep   . Spina bifida Son     died at age 63    History  Substance Use Topics  . Smoking status: Current Every Day Smoker -- 1.00 packs/day for 45 years    Types: Cigarettes    Start date: 07/30/1966  . Smokeless tobacco: Never Used  . Alcohol Use: 3.6 oz/week    6 Cans of beer per week     Comment: 1 case of beer and 1 shot of vodka between 1030am-1430pm    Review of Systems  All systems reviewed and negative, other than as noted in HPI.   Allergies  Bee venom and Penicillins  Home Medications   Prior to Admission medications   Medication Sig Start Date End Date Taking? Authorizing Provider  metoprolol tartrate (LOPRESSOR) 25 MG tablet Take 1 tablet (25 mg  total) by mouth 2 (two) times daily. For high blood pressure 01/06/15  Yes Charlynne Cousins, MD  nicotine (NICODERM CQ - DOSED IN MG/24 HOURS) 21 mg/24hr patch PLACE 1 PATCH ONTO THE SKIN DAILY 11/05/14  Yes Lorayne Marek, MD  trazodone (DESYREL) 150 MG tablet Take 2 tablets (300 mg total) by mouth at bedtime. For sleep 08/08/14  Yes Shuvon B Rankin, NP  acamprosate (CAMPRAL) 333 MG tablet Take 2 tablets (666 mg total) by mouth 3 (three) times daily with meals. For alcoholism Patient not taking: Reported on 01/18/2015 01/06/15   Charlynne Cousins, MD  albuterol (PROVENTIL HFA;VENTOLIN HFA) 108 (90 BASE) MCG/ACT inhaler Inhale 2 puffs into the lungs every 6 (six) hours as needed for wheezing or shortness of breath. Patient not taking: Reported on 01/18/2015 01/06/15   Charlynne Cousins, MD  albuterol-ipratropium [facility]) [phone] MCG/ACT inhaler Inhale 2 puffs into the lungs 2 (two)  times daily. For shortness of breath 09/22/14   Lorayne Marek, MD  aspirin 81 MG chewable tablet Chew 1 tablet (81 mg total) by mouth daily. Patient not taking: Reported on 12/10/2014 09/05/14   Verlee Monte, MD  FLUoxetine (PROZAC) 40 MG capsule Take 2 capsules (80 mg total) by mouth daily. For depression Patient not taking: Reported on 01/18/2015 01/06/15   Charlynne Cousins, MD  gabapentin (NEURONTIN) 300 MG capsule Take 1 capsule (300 mg total) by mouth 2 (two) times daily. For substance withdrawal syndrome Patient not taking: Reported on 01/18/2015 01/06/15   Charlynne Cousins, MD  naproxen (NAPROSYN) 500 MG tablet Take 500 mg by mouth 2 (two) times daily with a meal.    Historical Provider, MD  pantoprazole (PROTONIX) 40 MG tablet Take 1 tablet (40 mg total) by mouth 2 (two) times daily. Patient not taking: Reported on 01/18/2015 01/06/15   Charlynne Cousins, MD   BP 134/60 mmHg  Pulse 73  Temp(Src) 98 F (36.7 C) (Oral)  Resp 18  SpO2 78% Physical Exam  Constitutional: He appears well-developed and well-nourished. No distress.  HENT:  Head: Normocephalic and atraumatic.  Eyes: Conjunctivae are normal. Right eye exhibits no discharge. Left eye exhibits no discharge.  Neck: Neck supple.  Cardiovascular: Normal rate, regular rhythm and normal heart sounds.  Exam reveals no gallop and no friction rub.   No murmur heard. Pulmonary/Chest: Effort normal. No respiratory distress. He has wheezes.  Scattered wheezes b/l. No increased WOB.   Abdominal: Soft. He exhibits no distension. There is tenderness.  Epigastric tenderness w/o rebound or guarding.   Musculoskeletal: He exhibits no edema or tenderness.  Neurological: He is alert.  Laying in bed flipping through channels on TV. NAD. Alert. Oriented x3. CN intact. Strength 5/5 b/l u/l extremities. Sensation intact to light touch.   Skin: Skin is warm and dry. He is not diaphoretic.  Psychiatric: He has a normal mood and affect. His behavior  is normal. Thought content normal.  Nursing note and vitals reviewed.   ED Course  Procedures (including critical care time) Labs Review Labs Reviewed  CBC WITH DIFFERENTIAL/PLATELET - Abnormal; Notable for the following:    WBC 11.2 (*)    MCHC 36.7 (*)    RDW 16.2 (*)    Neutrophils Relative % 83 (*)    Neutro Abs 9.3 (*)    All other components within normal limits  COMPREHENSIVE METABOLIC PANEL  LIPASE, BLOOD    Imaging Review Ct Head Wo Contrast  01/18/2015   CLINICAL DATA:  New onset grand  mal seizure. Dizziness and headache postictal  EXAM: CT HEAD WITHOUT CONTRAST  TECHNIQUE: Contiguous axial images were obtained from the base of the skull through the vertex without intravenous contrast.  COMPARISON:  None.  FINDINGS: There is mild diffuse atrophy. There is no intracranial mass, hemorrhage, extra-axial fluid collection, or midline shift. Gray-white compartments are normal. No acute infarct apparent. Bony calvarium appears intact. The mastoid air cells are clear.  IMPRESSION: Mild diffuse atrophy.  Study otherwise unremarkable.   Electronically Signed   By: Lowella Grip III M.D.   On: 01/18/2015 14:53     EKG Interpretation   Date/Time:  Tuesday January 18 2015 13:46:37 EDT Ventricular Rate:  83 PR Interval:  140 QRS Duration: 116 QT Interval:  529 QTC Calculation: 622 R Axis:   69 Text Interpretation:  Sinus rhythm Nonspecific intraventricular conduction  delay Minimal ST elevation, anterior leads Confirmed by Wilson Singer  MD, Neaveh Belanger  ([id number]) on 01/18/2015 1:50:25 PM      MDM   Final diagnoses:  Epigastric pain    58yM with abdominal pain. Possibly gastritis. Pt abuses ETOH. Consider pancreatitis as well. Doubt ACS. Very atypical. Possible seizure for EMS? No apparent history of the same. He is currently alert and oriented. Nonfocal neuro exam. Last drink was yesterday. Consider ETOH withdrawal. Pt is not tachycardic, hypertensive nor particularly tremulous though.  Will CT for possible new onset seizure. Treat abdominal symptoms. If w/u without acute findings, then I feel he can be discharged with outpt FU. Pt counseled on need to stop drinking and likely continued symptoms if he does.     Virgel Manifold, MD 01/18/15 [phone]

## 2015-01-18 NOTE — H&P (Signed)
[facility] History and Physical  Samuel Reyes TWS:[id number] DOB: [birth year] DOA: 01/18/2015  Referring physician: Shirlyn Goltz, MD PCP: Lorayne Marek, MD   Chief Complaint: Hyponatremia  HPI: Samuel Reyes is a 58 y.o. male with past history of ETOH abuse COPD presents with seizures. Patient not a reliable historian. He staets that he is a regular drinker had his last drink yesterday. Patient states that he has had seizures before when he stops drinking. Apparently in the ambulance he had a seizure,. Neurology has seen the patient in the ED and he told them he has not had a seizure before. In the ED he had labwork repeated several times which shows severe hyponatremia and severe hypokalemia,. He is awake and answers questions though not reliable. He has no headache. States he has been having vomiting and diarrhea. Also he states he has been having pain in his epigastric area. No hematemesis noted no blood in the stool noted.   Review of Systems:  Complete 12 point ROS attempted and performed and is unremarkable other than HPI  Past Medical History  Diagnosis Date  . Cirrhosis   . Degenerative joint disease   . Hypertension   . Gastric ulcer   . Depression   . Asthma   . Mitral valve prolapse 2002  . H/O hiatal hernia   . COPD (chronic obstructive pulmonary disease)   . TIA (transient ischemic attack)     2010  . Stroke     TIA - 2010 - no deficits   . Shortness of breath     with exertion   . GERD (gastroesophageal reflux disease)   . Headache(784.0)   . Hiatal hernia 1982  . Degenerative joint disease   . Anxiety   . Alcohol abuse    Past Surgical History  Procedure Laterality Date  . Gastrectomy    . Shoulder surgery Bilateral     3 surgeries on on left, 2 surgeries on right   . Rt knee arthroscopic surgery    . Back surgery      3 cervical spine surgeries C4-C5 fused  . Hernia repair    . Finger surgery Left     2nd, 3rd, & 4th fingers were cut off by  table saw and reattached  . Colonoscopy N/A 01/04/2014    Procedure: COLONOSCOPY;  Surgeon: Danie Binder, MD;  Location: [facility];  Service: Endoscopy;  Laterality: N/A;  1:45  . Esophagogastroduodenoscopy N/A 01/04/2014    Procedure: ESOPHAGOGASTRODUODENOSCOPY (EGD);  Surgeon: Danie Binder, MD;  Location: [facility];  Service: Endoscopy;  Laterality: N/A;  . Incisional hernia repair N/A 01/20/2014    Procedure: LAPAROSCOPIC RECURRENT  INCISIONAL HERNIA with mesh;  Surgeon: Edward Jolly, MD;  Location: [facility];  Service: General;  Laterality: N/A;   Social History:  reports that he has been smoking Cigarettes.  He started smoking about 48 years ago. He has a 45 pack-year smoking history. He has never used smokeless tobacco. He reports that he drinks about 3.6 oz of alcohol per week. He reports that he does not use illicit drugs.  Allergies  Allergen Reactions  . Bee Venom Anaphylaxis  . Penicillins Rash    Family History  Problem Relation Age of Onset  . Cancer Father     bone  . Cancer Brother     lungs  . Stroke Maternal Grandmother   . Colon cancer Neg Hx   . Asthma Son     died at age  22 in his sleep   . Spina bifida Son     died at age 46      Prior to Admission medications   Medication Sig Start Date End Date Taking? Authorizing Provider  albuterol-ipratropium (COMBIVENT) 18-103 MCG/ACT inhaler Inhale 2 puffs into the lungs 2 (two) times daily. For shortness of breath 09/22/14  Yes Lorayne Marek, MD  metoprolol tartrate (LOPRESSOR) 25 MG tablet Take 1 tablet (25 mg total) by mouth 2 (two) times daily. For high blood pressure 01/06/15  Yes Charlynne Cousins, MD  nicotine (NICODERM CQ - DOSED IN MG/24 HOURS) 21 mg/24hr patch PLACE 1 PATCH ONTO THE SKIN DAILY 11/05/14  Yes Lorayne Marek, MD  trazodone (DESYREL) 150 MG tablet Take 2 tablets (300 mg total) by mouth at bedtime. For sleep 08/08/14  Yes Shuvon B Rankin, NP  acamprosate (CAMPRAL) 333 MG tablet Take 2 tablets  (666 mg total) by mouth 3 (three) times daily with meals. For alcoholism Patient not taking: Reported on 01/18/2015 01/06/15   Charlynne Cousins, MD  albuterol (PROVENTIL HFA;VENTOLIN HFA) 108 (90 BASE) MCG/ACT inhaler Inhale 2 puffs into the lungs every 6 (six) hours as needed for wheezing or shortness of breath. Patient not taking: Reported on 01/18/2015 01/06/15   Charlynne Cousins, MD  aspirin 81 MG chewable tablet Chew 1 tablet (81 mg total) by mouth daily. Patient not taking: Reported on 12/10/2014 09/05/14   Verlee Monte, MD  FLUoxetine (PROZAC) 40 MG capsule Take 2 capsules (80 mg total) by mouth daily. For depression Patient not taking: Reported on 01/18/2015 01/06/15   Charlynne Cousins, MD  gabapentin (NEURONTIN) 300 MG capsule Take 1 capsule (300 mg total) by mouth 2 (two) times daily. For substance withdrawal syndrome Patient not taking: Reported on 01/18/2015 01/06/15   Charlynne Cousins, MD  naproxen (NAPROSYN) 500 MG tablet Take 500 mg by mouth 2 (two) times daily with a meal.    Historical Provider, MD  pantoprazole (PROTONIX) 40 MG tablet Take 1 tablet (40 mg total) by mouth 2 (two) times daily. Patient not taking: Reported on 01/18/2015 01/06/15   Charlynne Cousins, MD   Physical Exam: Filed Vitals:   01/18/15 1600 01/18/15 1630 01/18/15 1740 01/18/15 1748  BP: 115/69 113/69 109/63 109/63  Pulse:   78 74  Temp:      TempSrc:      Resp: 16 15 20 16   SpO2:   98% 98%    Wt Readings from Last 3 Encounters:  01/05/15 65.726 kg (144 lb 14.4 oz)  12/10/14 70.217 kg (154 lb 12.8 oz)  09/22/14 72.031 kg (158 lb 12.8 oz)    General:  Appears calm and comfortable Eyes: PERRL, normal lids, irises & conjunctiva ENT: grossly normal hearing, lips & tongue Neck: no LAD, masses or thyromegaly Cardiovascular: RRR, no m/r/g. No LE edema. Respiratory: CTA bilaterally, no w/r/r. Normal respiratory effort. Abdomen: soft, c/o epigastric tenderness Skin: multiple areas of bruising  noted Musculoskeletal: grossly normal tone BUE/BLE Neurologic: grossly non-focal moves all 4 extremities          Labs on Admission:  Basic Metabolic Panel:  Recent Labs Lab 01/18/15 1606 01/18/15 1754  NA 114* 117*  K <2.0* <2.0*  CL <65* <65*  CO2 41* 43*  GLUCOSE 113* 93  BUN 11 10  CREATININE 0.75 0.74  CALCIUM 7.1* 7.5*   Liver Function Tests:  Recent Labs Lab 01/18/15 1606  AST 26  ALT 28  ALKPHOS 60  BILITOT 0.8  PROT 5.6*  ALBUMIN 2.9*    Recent Labs Lab 01/18/15 1606  LIPASE 12*   No results for input(s): AMMONIA in the last 168 hours. CBC:  Recent Labs Lab 01/18/15 1437  WBC 11.2*  NEUTROABS 9.3*  HGB 14.3  HCT 39.0  MCV 80.9  PLT 194   Cardiac Enzymes: No results for input(s): CKTOTAL, CKMB, CKMBINDEX, TROPONINI in the last 168 hours.  BNP (last 3 results)  Recent Labs  12/10/14 1339 01/04/15 1432  BNP 46.5 20.9    ProBNP (last 3 results)  Recent Labs  04/14/14 1911  PROBNP 49.4    CBG: No results for input(s): GLUCAP in the last 168 hours.  Radiological Exams on Admission: Ct Head Wo Contrast  01/18/2015   CLINICAL DATA:  New onset grand mal seizure. Dizziness and headache postictal  EXAM: CT HEAD WITHOUT CONTRAST  TECHNIQUE: Contiguous axial images were obtained from the base of the skull through the vertex without intravenous contrast.  COMPARISON:  None.  FINDINGS: There is mild diffuse atrophy. There is no intracranial mass, hemorrhage, extra-axial fluid collection, or midline shift. Gray-white compartments are normal. No acute infarct apparent. Bony calvarium appears intact. The mastoid air cells are clear.  IMPRESSION: Mild diffuse atrophy.  Study otherwise unremarkable.   Electronically Signed   By: Lowella Grip III M.D.   On: 01/18/2015 14:53      Assessment/Plan Active Problems:   Essential hypertension   COPD (chronic obstructive pulmonary disease)   Hypokalemia   Hyponatremia   1. Severe  Hyponatremia -will be started on IVF NS at 75cc per hour -monitor sodium levels q4h -seizure precautions  2. Hypokalemia -will replete with KCL IV infusion 6 runs intially -repeat K level with sodium levels Q4h -will check magnesium level and replete as needed -phos levels ordered  3. Hypertension -patient has not been taking home medications -will resume metoprolol and monitor pressures  4. COPD -continue with combivent as needed  5. Ongoing ETOH abuse -counseling on cessation of alcohol needs CSW consult -CIWA withdrawal protocol  6. Seizure -due to hyponatremia or ETOH -will monitor and order EEG MRI -place on seizure precautions   Code Status: Full Code (must indicate code status--if unknown or must be presumed, indicate so) DVT Prophylaxis:Heparin Family Communication: none (indicate person spoken with, if applicable, with phone number if by telephone) Disposition Plan: Home (indicate anticipated LOS)  Time spent: 42min  KHAN,SAADAT A [facility] Pager [phone]

## 2015-01-18 NOTE — ED Notes (Signed)
Unable to collect labs at this time because patient is not min room.

## 2015-01-18 NOTE — ED Provider Notes (Signed)
  Physical Exam  BP 113/69 mmHg  Pulse 72  Temp(Src) 98 F (36.7 C) (Oral)  Resp 15  SpO2 100%  Physical Exam  ED Course  Procedures  MDM Care assumed at sign out. Patient chronic alcoholic and last drink was yesterday. Had seizure today. CT head unremarkable. Sign out pending labs and ETOH level. ETOH neg. But Na 114, K < 2. After 1L NS, recheck was 117. Consulted critical care, Dr. Lake Bells, who doesn't want to start hypertonic saline. Wants to try NS for now. Recommend admission to stepdown. K supplemented. Will admit to stepdown under Dr. Humphrey Rolls for hyponatremia, seizures. Neuro consulted as well.   CRITICAL CARE Performed by: Darl Householder, DAVID   Total critical care time: 30 min   Critical care time was exclusive of separately billable procedures and treating other patients.  Critical care was necessary to treat or prevent imminent or life-threatening deterioration.  Critical care was time spent personally by me on the following activities: development of treatment plan with patient and/or surrogate as well as nursing, discussions with consultants, evaluation of patient's response to treatment, examination of patient, obtaining history from patient or surrogate, ordering and performing treatments and interventions, ordering and review of laboratory studies, ordering and review of radiographic studies, pulse oximetry and re-evaluation of patient's condition.   Wandra Arthurs, MD 01/18/15 1910

## 2015-01-18 NOTE — ED Notes (Signed)
O2 sats frequently at  75%- 89%. Patient placed on O2 2L/min via [facility]. sats increased to 98%.

## 2015-01-18 NOTE — ED Notes (Signed)
Gave I stat results to MD Darl Householder  And did not get a result in [employer]  Result or a CL result.  Patient had 2 critical results downstairs.

## 2015-01-19 ENCOUNTER — Inpatient Hospital Stay (HOSPITAL_COMMUNITY): Payer: Medicaid Other

## 2015-01-19 ENCOUNTER — Inpatient Hospital Stay (HOSPITAL_COMMUNITY)
Admit: 2015-01-19 | Discharge: 2015-01-19 | Disposition: A | Discharge status: Home / Self Care | Payer: Medicaid Other | Attending: Neurology | Admitting: Neurology

## 2015-01-19 DIAGNOSIS — E876 Hypokalemia: Secondary | ICD-10-CM

## 2015-01-19 DIAGNOSIS — R569 Unspecified convulsions: Secondary | ICD-10-CM

## 2015-01-19 DIAGNOSIS — J449 Chronic obstructive pulmonary disease, unspecified: Secondary | ICD-10-CM

## 2015-01-19 DIAGNOSIS — E871 Hypo-osmolality and hyponatremia: Principal | ICD-10-CM

## 2015-01-19 LAB — COMPREHENSIVE METABOLIC PANEL
ALBUMIN: 2.7 g/dL — AB (ref 3.5–5.0)
ALK PHOS: 53 U/L (ref 38–126)
ALT: 30 U/L (ref 17–63)
AST: 30 U/L (ref 15–41)
Anion gap: 9 (ref 5–15)
BILIRUBIN TOTAL: 0.6 mg/dL (ref 0.3–1.2)
BUN: 7 mg/dL (ref 6–20)
CO2: 38 mmol/L — ABNORMAL HIGH (ref 22–32)
Calcium: 7.3 mg/dL — ABNORMAL LOW (ref 8.9–10.3)
Chloride: 76 mmol/L — ABNORMAL LOW (ref 101–111)
Creatinine, Ser: 0.71 mg/dL (ref 0.61–1.24)
GFR calc Af Amer: >60 mL/min (ref >60)
GFR calc non Af Amer: >60 mL/min (ref >60)
Glucose, Bld: 93 mg/dL (ref 65–99)
Potassium: 2.2 mmol/L — CL (ref 3.5–5.1)
SODIUM: 123 mmol/L — AB (ref 135–145)
TOTAL PROTEIN: 5 g/dL — AB (ref 6.5–8.1)

## 2015-01-19 LAB — BASIC METABOLIC PANEL
ANION GAP: 8 (ref 5–15)
ANION GAP: 8 (ref 5–15)
Anion gap: 7 (ref 5–15)
Anion gap: 9 (ref 5–15)
BUN: 7 mg/dL (ref 6–20)
BUN: 7 mg/dL (ref 6–20)
BUN: 8 mg/dL (ref 6–20)
BUN: 8 mg/dL (ref 6–20)
CALCIUM: 7.7 mg/dL — AB (ref 8.9–10.3)
CHLORIDE: 82 mmol/L — AB (ref 101–111)
CO2: 41 mmol/L — ABNORMAL HIGH (ref 22–32)
CO2: 36 mmol/L — ABNORMAL HIGH (ref 22–32)
CO2: 37 mmol/L — ABNORMAL HIGH (ref 22–32)
CO2: 35 mmol/L — AB (ref 22–32)
CREATININE: 0.63 mg/dL (ref 0.61–1.24)
Calcium: 7.5 mg/dL — ABNORMAL LOW (ref 8.9–10.3)
Calcium: 7.4 mg/dL — ABNORMAL LOW (ref 8.9–10.3)
Calcium: 7.7 mg/dL — ABNORMAL LOW (ref 8.9–10.3)
Chloride: 72 mmol/L — ABNORMAL LOW (ref 101–111)
Chloride: 77 mmol/L — ABNORMAL LOW (ref 101–111)
Chloride: 77 mmol/L — ABNORMAL LOW (ref 101–111)
Creatinine, Ser: 0.77 mg/dL (ref 0.61–1.24)
Creatinine, Ser: 0.68 mg/dL (ref 0.61–1.24)
Creatinine, Ser: 0.74 mg/dL (ref 0.61–1.24)
GFR calc Af Amer: >60 mL/min (ref >60)
GFR calc Af Amer: >60 mL/min (ref >60)
GFR calc Af Amer: >60 mL/min (ref >60)
GFR calc non Af Amer: >60 mL/min (ref >60)
GFR calc non Af Amer: >60 mL/min (ref >60)
GLUCOSE: 94 mg/dL (ref 65–99)
Glucose, Bld: 105 mg/dL — ABNORMAL HIGH (ref 65–99)
Glucose, Bld: 92 mg/dL (ref 65–99)
Glucose, Bld: 98 mg/dL (ref 65–99)
POTASSIUM: 2.9 mmol/L — AB (ref 3.5–5.1)
Potassium: 2.2 mmol/L — CL (ref 3.5–5.1)
Potassium: 2.8 mmol/L — ABNORMAL LOW (ref 3.5–5.1)
SODIUM: 121 mmol/L — AB (ref 135–145)
SODIUM: 120 mmol/L — AB (ref 135–145)
SODIUM: 125 mmol/L — AB (ref 135–145)
Sodium: 123 mmol/L — ABNORMAL LOW (ref 135–145)

## 2015-01-19 LAB — CBC
HCT: 34.6 % — ABNORMAL LOW (ref 39.0–52.0)
HEMOGLOBIN: 12 g/dL — AB (ref 13.0–17.0)
MCH: 29 pg (ref 26.0–34.0)
MCHC: 34.7 g/dL (ref 30.0–36.0)
MCV: 83.6 fL (ref 78.0–100.0)
Platelets: 154 10*3/uL (ref 150–400)
RBC: 4.14 MIL/uL — AB (ref 4.22–5.81)
RDW: 16.5 % — ABNORMAL HIGH (ref 11.5–15.5)
WBC: 6.3 10*3/uL (ref 4.0–10.5)

## 2015-01-19 LAB — GLUCOSE, CAPILLARY: Glucose-Capillary: 102 mg/dL — ABNORMAL HIGH (ref 65–99)

## 2015-01-19 MED ORDER — K PHOS MONO-SOD PHOS DI & MONO 155-852-130 MG PO TABS
500.0000 mg | ORAL_TABLET | Freq: Two times a day (BID) | ORAL | Status: AC
  Administered 2015-01-19 – 2015-01-20 (×4): 500 mg via ORAL
  Filled 2015-01-19 (×6): qty 2

## 2015-01-19 MED ORDER — POTASSIUM CHLORIDE CRYS ER 20 MEQ PO TBCR
40.0000 meq | EXTENDED_RELEASE_TABLET | Freq: Once | ORAL | Status: AC
  Administered 2015-01-19: 40 meq via ORAL
  Filled 2015-01-19: qty 2

## 2015-01-19 MED ORDER — POTASSIUM CHLORIDE CRYS ER 20 MEQ PO TBCR
40.0000 meq | EXTENDED_RELEASE_TABLET | ORAL | Status: AC
  Administered 2015-01-19 (×3): 40 meq via ORAL
  Filled 2015-01-19 (×3): qty 2

## 2015-01-19 MED ORDER — POTASSIUM CHLORIDE 10 MEQ/100ML IV SOLN
10.0000 meq | INTRAVENOUS | Status: AC
  Administered 2015-01-19 (×4): 10 meq via INTRAVENOUS
  Filled 2015-01-19 (×3): qty 100

## 2015-01-19 MED ORDER — GADOBENATE DIMEGLUMINE 529 MG/ML IV SOLN
15.0000 mL | Freq: Once | INTRAVENOUS | Status: AC | PRN
  Administered 2015-01-19: 13 mL via INTRAVENOUS

## 2015-01-19 MED ORDER — POTASSIUM CHLORIDE 10 MEQ/100ML IV SOLN
10.0000 meq | INTRAVENOUS | Status: AC
  Administered 2015-01-19 (×4): 10 meq via INTRAVENOUS
  Filled 2015-01-19: qty 100

## 2015-01-19 MED ORDER — ENSURE ENLIVE PO LIQD
237.0000 mL | Freq: Two times a day (BID) | ORAL | Status: DC
  Administered 2015-01-19 – 2015-01-22 (×5): 237 mL via ORAL

## 2015-01-19 MED ORDER — K PHOS MONO-SOD PHOS DI & MONO 155-852-130 MG PO TABS: 250.0000 mg | ORAL_TABLET | Freq: Two times a day (BID) | ORAL | Status: DC

## 2015-01-19 MED ORDER — MUPIROCIN 2 % EX OINT
1.0000 "application " | TOPICAL_OINTMENT | Freq: Two times a day (BID) | CUTANEOUS | Status: DC
  Administered 2015-01-19 – 2015-01-22 (×7): 1 via NASAL
  Filled 2015-01-19 (×3): qty 22

## 2015-01-19 MED ORDER — CHLORHEXIDINE GLUCONATE CLOTH 2 % EX PADS
6.0000 | MEDICATED_PAD | Freq: Every day | CUTANEOUS | Status: DC
  Administered 2015-01-19: 6 via TOPICAL

## 2015-01-19 NOTE — Progress Notes (Signed)
Date:  January 18, 2015 U.R. performed for needs and level of care. Will continue to follow for Case Management needs.  Rhonda Davis, RN, BSN, CCM   [phone] 

## 2015-01-19 NOTE — Progress Notes (Addendum)
Patient Demographics  Samuel Reyes, is a 59 y.o. male, DOB - [birth year], XLK:[id number]  Admit date - 01/18/2015   Admitting Physician Allyne Gee, MD  Outpatient Primary MD for the patient is Lorayne Marek, MD  LOS - 1   Chief Complaint  Patient presents with  . Seizures       Admission HPI/Brief narrative: 59 year old male with history of alcohol abuse and COPD, presents with seizures, poor Historian, multiple labs abnormalities including hyponatremia, hypokalemia.  Subjective:   Urian Martenson today has, No headache, No chest pain, No abdominal pain - No Nausea, No Cough - SOB.   Assessment & Plan    Active Problems:   Essential hypertension   COPD (chronic obstructive pulmonary disease)   Hypokalemia   Hyponatremia   Protein-calorie malnutrition, severe  Severe hyponatremia - Continue on step down unit. - Continue with IV normal saline at 60 ml/hr to avoid rapid correction  - Continue to monitor BMP every 4 hours - Was likely related to volume depletion versus beer potomania  Seizures - Related to hypernatremia, possibility of alcohol withdrawal, as patient alcohol level was within normal limits upon presentation. - Neurology consult appreciated, follow on EEG and MRI - Continue with seizure precaution  Alcohol abuse  - Counseled, continue with CIWA protocol  Protein calorie malnutrition - Continue ensure  Hypokalemia - Repleted, monitor closely, magnesium within normal limits  Hypophosphatemia -  mild, repleting  Hypertension - Acceptable on metoprolol  COPD - No wheezing  Code Status: Full  Family Communication: Discussed with patient, none at bedside  Disposition Plan: Remains stepdown   Procedures  None   Consults   Neurology   Medications  Scheduled Meds: . acamprosate  666 mg Oral TID WC  . Chlorhexidine Gluconate Cloth  6 each Topical  Q0600  . FLUoxetine  80 mg Oral Daily  . folic acid  1 mg Oral Daily  . gabapentin  300 mg Oral BID  . heparin  5,000 Units Subcutaneous 3 times per day  . ipratropium-albuterol  3 mL Nebulization BID  . metoprolol tartrate  25 mg Oral BID  . multivitamin with minerals  1 tablet Oral Daily  . mupirocin ointment  1 application Nasal BID  . nicotine  21 mg Transdermal Daily  . pantoprazole  40 mg Oral BID  . phosphorus  500 mg Oral BID  . potassium chloride  10 mEq Intravenous Q1 Hr x 4  . potassium chloride  40 mEq Oral Q3H  . sodium chloride  3 mL Intravenous Q12H  . thiamine  100 mg Oral Daily   Or  . thiamine  100 mg Intravenous Daily  . trazodone  300 mg Oral QHS   Continuous Infusions: . sodium chloride 40 mL/hr at 01/19/15 0445   PRN Meds:.LORazepam **OR** LORazepam, ondansetron **OR** ondansetron (ZOFRAN) IV, oxyCODONE  DVT Prophylaxis   Heparin   Lab Results  Component Value Date   PLT 154 01/19/2015    Antibiotics    Anti-infectives    None          Objective:   Filed Vitals:   01/19/15 0812 01/19/15 0823 01/19/15 0900 01/19/15 0951  BP:   118/54 119/64  Pulse:   80 80  Temp:  TempSrc:      Resp:   17   Height:      Weight:      SpO2: 92% 92% 97%     Wt Readings from Last 3 Encounters:  01/18/15 63.8 kg (140 lb 10.5 oz)  01/05/15 65.726 kg (144 lb 14.4 oz)  12/10/14 70.217 kg (154 lb 12.8 oz)     Intake/Output Summary (Last 24 hours) at 01/19/15 1102 Last data filed at 01/19/15 0949  Gross per 24 hour  Intake   1590 ml  Output    625 ml  Net    965 ml     Physical Exam  Awake Alert, Oriented , No new F.N deficits, Normal affect [place].AT,PERRAL Supple Neck,No JVD, No cervical lymphadenopathy appriciated.  Symmetrical Chest wall movement, Good air movement bilaterally, CTAB RRR,No Gallops,Rubs or new Murmurs, No Parasternal Heave +ve B.Sounds, Abd Soft, No tenderness, No organomegaly appriciated, No rebound - guarding or  rigidity. No Cyanosis, Clubbing or edema, No new Rash or bruise  , no tremors   Data Review   Micro Results Recent Results (from the past 240 hour(s))  MRSA PCR Screening     Status: Abnormal   Collection Time: 01/18/15  8:39 PM  Result Value Ref Range Status   MRSA by PCR POSITIVE (A) NEGATIVE Final    Comment:        The GeneXpert MRSA Assay (FDA approved for NASAL specimens only), is one component of a comprehensive MRSA colonization surveillance program. It is not intended to diagnose MRSA infection nor to guide or monitor treatment for MRSA infections. RESULT CALLED TO, READ BACK BY AND VERIFIED WITH: Janene Madeira [id number] @ [address]     Radiology Reports Dg Chest 2 View  01/04/2015   CLINICAL DATA:  Chest pain. Initial encounter. Sharp substernal chest pain onset of symptoms 2 hr ago. Short of breath and nausea.  EXAM: CHEST  2 VIEW  COMPARISON:  12/10/2014.  FINDINGS: Cardiopericardial silhouette within normal limits. Mediastinal contours normal. Trachea midline. No airspace disease or effusion. Surgical clips in the upper abdomen. Truncation of the distal LEFT clavicle, likely postsurgical.  IMPRESSION: No active cardiopulmonary disease.   Electronically Signed   By: Dereck Ligas M.D.   On: 01/04/2015 15:20   Ct Head Wo Contrast  01/18/2015   CLINICAL DATA:  New onset grand mal seizure. Dizziness and headache postictal  EXAM: CT HEAD WITHOUT CONTRAST  TECHNIQUE: Contiguous axial images were obtained from the base of the skull through the vertex without intravenous contrast.  COMPARISON:  None.  FINDINGS: There is mild diffuse atrophy. There is no intracranial mass, hemorrhage, extra-axial fluid collection, or midline shift. Gray-white compartments are normal. No acute infarct apparent. Bony calvarium appears intact. The mastoid air cells are clear.  IMPRESSION: Mild diffuse atrophy.  Study otherwise unremarkable.   Electronically Signed   By: Lowella Grip  III M.D.   On: 01/18/2015 14:53   Dg Abd Acute W/chest  01/04/2015   CLINICAL DATA:  Mid chest pain.  Symptoms for 1 week.  EXAM: DG ABDOMEN ACUTE W/ 1V CHEST  COMPARISON:  Plain films earlier today.  FINDINGS: Scattered air-fluid levels throughout small and large bowel without definite obstruction. Increased number of visible small bowel loops is also abnormal. No free air is observed. Surgical clips surround the GE junction. Radiodense material in LEFT upper quadrant likely ingested within the stomach. Heart size and mediastinal contours are within normal limits. Both lungs are clear.  IMPRESSION: Suspected ileus. No acute cardiopulmonary disease. Stable appearance of the chest from priors.   Electronically Signed   By: Rolla Flatten M.D.   On: 01/04/2015 20:01     CBC  Recent Labs Lab 01/18/15 1437 01/19/15 0340  WBC 11.2* 6.3  HGB 14.3 12.0*  HCT 39.0 34.6*  PLT 194 154  MCV 80.9 83.6  MCH 29.7 29.0  MCHC 36.7* 34.7  RDW 16.2* 16.5*  LYMPHSABS 1.5  --   MONOABS 0.4  --   EOSABS 0.0  --   BASOSABS 0.0  --     Chemistries   Recent Labs Lab 01/18/15 1606 01/18/15 1754 01/18/15 2136 01/18/15 2326 01/19/15 0340 01/19/15 0640  NA 114* 117* 120* 121* 123* 120*  K <2.0* <2.0* <2.0* <2.0* 2.2* 2.2*  CL <65* <65* 67* 72* 76* 77*  CO2 41* 43* 42* 41* 38* 36*  GLUCOSE 113* 93 96 105* 93 92  BUN 11 10 8 7 7 7   CREATININE 0.75 0.74 0.76 0.77 0.71 0.68  CALCIUM 7.1* 7.5* 7.5* 7.5* 7.3* 7.4*  MG 2.1  --   --   --   --   --   AST 26  --   --   --  30  --   ALT 28  --   --   --  30  --   ALKPHOS 60  --   --   --  53  --   BILITOT 0.8  --   --   --  0.6  --    ------------------------------------------------------------------------------------------------------------------ estimated creatinine clearance is 83.3 mL/min (by C-G formula based on Cr of 0.68). ------------------------------------------------------------------------------------------------------------------ No results  for input(s): HGBA1C in the last 72 hours. ------------------------------------------------------------------------------------------------------------------ No results for input(s): CHOL, HDL, LDLCALC, TRIG, CHOLHDL, LDLDIRECT in the last 72 hours. ------------------------------------------------------------------------------------------------------------------  Recent Labs  01/18/15 2136  TSH 0.962   ------------------------------------------------------------------------------------------------------------------ No results for input(s): VITAMINB12, FOLATE, FERRITIN, TIBC, IRON, RETICCTPCT in the last 72 hours.  Coagulation profile No results for input(s): INR, PROTIME in the last 168 hours.  No results for input(s): DDIMER in the last 72 hours.  Cardiac Enzymes No results for input(s): CKMB, TROPONINI, MYOGLOBIN in the last 168 hours.  Invalid input(s): CK ------------------------------------------------------------------------------------------------------------------ Invalid input(s): POCBNP     Time Spent in minutes   30  Minutes    Verlon Pischke M.D on 01/19/2015 at 11:02 AM  Between 7am to 7pm - Pager - [phone]  After 7pm go to [email or web address] - password [facility]  [facility]   Office  [phone]

## 2015-01-19 NOTE — Procedures (Addendum)
ELECTROENCEPHALOGRAM REPORT   Patient: Samuel Reyes       Room #: IP3825 EEG No. ID: [id number] Age: 59 y.o.        Sex: male Referring Physician: Elgergawy Report Date:  01/19/2015        Interpreting Physician: Alexis Goodell  History: [name] is an 60 y.o. male with new onset seizure  Medications:  Scheduled: . acamprosate  666 mg Oral TID WC  . Chlorhexidine Gluconate Cloth  6 each Topical Q0600  . feeding supplement (ENSURE ENLIVE)  237 mL Oral BID BM  . FLUoxetine  80 mg Oral Daily  . folic acid  1 mg Oral Daily  . gabapentin  300 mg Oral BID  . heparin  5,000 Units Subcutaneous 3 times per day  . ipratropium-albuterol  3 mL Nebulization BID  . metoprolol tartrate  25 mg Oral BID  . multivitamin with minerals  1 tablet Oral Daily  . mupirocin ointment  1 application Nasal BID  . nicotine  21 mg Transdermal Daily  . pantoprazole  40 mg Oral BID  . phosphorus  500 mg Oral BID  . potassium chloride  10 mEq Intravenous Q1 Hr x 4  . potassium chloride  40 mEq Oral Once  . sodium chloride  3 mL Intravenous Q12H  . thiamine  100 mg Oral Daily   Or  . thiamine  100 mg Intravenous Daily  . trazodone  300 mg Oral QHS    Conditions of Recording:  This is a 16 channel EEG carried out with the patient in the awake and drowsy states.  Description:  The waking background activity consists of a low voltage, symmetrical, fairly well organized, 8 Hz alpha activity, seen from the parieto-occipital and posterior temporal regions.  Low voltage fast activity, poorly organized, is seen anteriorly and is at times superimposed on more posterior regions.  A mixture of theta and alpha rhythms are seen from the central and temporal regions. The patient drowses with slowing to irregular, low voltage theta and beta activity.   Stage II sleep is not obtained. Hyperventilation and intermittent photic stimulation were not performed.   IMPRESSION: Normal electroencephalogram, awake and  drowsy. There are no focal lateralizing or epileptiform features.   Alexis Goodell, MD [facility] [phone] 01/19/2015, 6:08 PM

## 2015-01-19 NOTE — Care Management Note (Signed)
Case Management Note  Patient Details  Name: Samuel Reyes MRN: [id number] Date of Birth: 1955-12-19  Subjective/Objective:                 etoh withdrawal , hallucinations and seizures   Action/Plan: tbd   Expected Discharge Date:   (unknown)              [id number] Expected Discharge Plan:     In-House Referral:  Clinical Social Work  Discharge planning Services  CM Consult  Post Acute Care Choice:  NA Choice offered to:  NA  DME Arranged:  N/A DME Agency:  NA  HH Arranged:  NA HH Agency:  NA  Status of Service:  In process, will continue to follow  Medicare Important Message Given:    Date Medicare IM Given:    Medicare IM give by:    Date Additional Medicare IM Given:    Additional Medicare Important Message give by:     If discussed at [place] of Stay Meetings, dates discussed:    Additional Comments:  Leeroy Cha, RN 01/19/2015, 8:50 AM

## 2015-01-19 NOTE — Progress Notes (Signed)
Initial Nutrition Assessment  DOCUMENTATION CODES:  Severe malnutrition in context of social or environmental circumstances  INTERVENTION: - Will monitor for need for supplements - Monitor for refeeding with limited intakes PTA  NUTRITION DIAGNOSIS:  Inadequate oral intake related to nausea, social / environmental circumstances as evidenced by per patient/family report.  GOAL:  Patient will meet greater than or equal to 90% of their needs  MONITOR:  PO intake, Weight trends, Labs, I & O's  REASON FOR ASSESSMENT:  Malnutrition Screening Tool  ASSESSMENT: 59 y.o. male with past history of ETOH abuse COPD presents with seizures. Patient not a reliable historian. He staets that he is a regular drinker had his last drink yesterday. Patient states that he has had seizures before when he stops drinking. Apparently in the ambulance he had a seizure,. Neurology has seen the patient in the ED and he told them he has not had a seizure before. In the ED he had labwork repeated several times which shows severe hyponatremia and severe hypokalemia.  Pt seen for MST. BMI indicates normal weight status. Pt ate 100% of sausage and eggs for breakfast this AM and reports nausea after intakes. He states that PTA he was drinking heavily and that he would often go 1-2 weeks without eating, only drinking alcohol. He states that he has lost weight recently but he is unsure how much or what UBW is. Per weight hx review, pt has lost 18 lbs (11% body weight) in the past 4 months which is significant for time frame.   Pt reports that he felt nauseous after breakfast this AM and that this is usual for him. Not meeting needs PTA. No muscle or fat wasting noted at this time. Medications reviewed. Labs reviewed; Na: 120 mmol/L, K: 2.2 mmol/L, Cl: 77 mmol/L, Ca: 7.4 mg/dL.  Height:  Ht Readings from Last 1 Encounters:  01/18/15 5\' 4"  (1.626 m)    Weight:  Wt Readings from Last 1 Encounters:  01/18/15 140 lb  10.5 oz (63.8 kg)    Ideal Body Weight:  59.1 kg (kg)  Wt Readings from Last 10 Encounters:  01/18/15 140 lb 10.5 oz (63.8 kg)  01/05/15 144 lb 14.4 oz (65.726 kg)  12/10/14 154 lb 12.8 oz (70.217 kg)  09/22/14 158 lb 12.8 oz (72.031 kg)  08/31/14 160 lb (72.576 kg)  08/02/14 155 lb (70.308 kg)  07/23/14 156 lb 4.9 oz (70.9 kg)  04/15/14 150 lb (68.04 kg)  03/09/14 144 lb (65.318 kg)  02/24/14 154 lb 6.4 oz (70.035 kg)    BMI:  Body mass index is 24.13 kg/(m^2).  Estimated Nutritional Needs:  Kcal:  1500-1700  Protein:  60-70 grams  Fluid:  2.2 L/day  Skin:  Reviewed, no issues  Diet Order:  Diet heart healthy/carb modified Room service appropriate?: Yes; Fluid consistency:: Thin  EDUCATION NEEDS:  No education needs identified at this time   Intake/Output Summary (Last 24 hours) at 01/19/15 0937 Last data filed at 01/19/15 0800  Gross per 24 hour  Intake   1350 ml  Output    625 ml  Net    725 ml    Last BM:  PTA    Jarome Matin, RD, LDN Inpatient Clinical Dietitian Pager # [phone] After hours/weekend pager # [phone]

## 2015-01-19 NOTE — Progress Notes (Signed)
Subjective: Patient remains lethargic but easily awakened.  No further seizures since admission.  Continues to complain of right face and arm numbness.    Objective: Current vital signs: BP 107/69 mmHg  Pulse 76  Temp(Src) 98.7 F (37.1 C) (Oral)  Resp 22  Ht 5\' 4"  (1.626 m)  Wt 63.8 kg (140 lb 10.5 oz)  BMI 24.13 kg/m2  SpO2 97% Vital signs in last 24 hours: Temp:  [97.5 F (36.4 C)-98.7 F (37.1 C)] 98.7 F (37.1 C) (06/22 0800) Pulse Rate:  [64-87] 76 (06/22 1100) Resp:  [11-25] 22 (06/22 1100) BP: (81-141)/(53-76) 107/69 mmHg (06/22 1100) SpO2:  [78 %-100 %] 97 % (06/22 1100) Weight:  [63.8 kg (140 lb 10.5 oz)] 63.8 kg (140 lb 10.5 oz) (06/21 2030)  Intake/Output from previous day: 06/21 0701 - 06/22 0700 In: 1310 [I.V.:710; IV Piggyback:600] Out: 625 [Urine:625] Intake/Output this shift: Total I/O In: 360 [I.V.:160; IV Piggyback:200] Out: -  Nutritional status: Diet heart healthy/carb modified Room service appropriate?: Yes; Fluid consistency:: Thin  Neurologic Exam: Mental Status: Lethargic, oriented, thought content appropriate. Speech fluent without evidence of aphasia. Able to follow 3 step commands without difficulty. Cranial Nerves: II: Discs flat bilaterally; Visual fields grossly normal, pupils equal, round, reactive to light and accommodation III,IV, VI: ptosis not present, extra-ocular motions intact bilaterally V,VII: smile symmetric, facial light touch sensation decreased on the right side of the face VIII: hearing normal bilaterally IX,X: gag reflex present XI: bilateral shoulder shrug XII: midline tongue extension Motor: Gives very little resistance but lifts all extremties against gravity and shows no focal weakness.  Sensory: Pinprick and light touch decreased in the RUE Deep Tendon Reflexes: 2+ and symmetric throughout   Lab Results: Basic Metabolic Panel:  Recent Labs Lab 01/18/15 1606 01/18/15 1754 01/18/15 2136 01/18/15 2326  01/19/15 0340 01/19/15 0640  NA 114* 117* 120* 121* 123* 120*  K <2.0* <2.0* <2.0* <2.0* 2.2* 2.2*  CL <65* <65* 67* 72* 76* 77*  CO2 41* 43* 42* 41* 38* 36*  GLUCOSE 113* 93 96 105* 93 92  BUN 11 10 8 7 7 7   CREATININE 0.75 0.74 0.76 0.77 0.71 0.68  CALCIUM 7.1* 7.5* 7.5* 7.5* 7.3* 7.4*  MG 2.1  --   --   --   --   --   PHOS 2.4*  --   --   --   --   --     Liver Function Tests:  Recent Labs Lab 01/18/15 1606 01/19/15 0340  AST 26 30  ALT 28 30  ALKPHOS 60 53  BILITOT 0.8 0.6  PROT 5.6* 5.0*  ALBUMIN 2.9* 2.7*    Recent Labs Lab 01/18/15 1606  LIPASE 12*   No results for input(s): AMMONIA in the last 168 hours.  CBC:  Recent Labs Lab 01/18/15 1437 01/19/15 0340  WBC 11.2* 6.3  NEUTROABS 9.3*  --   HGB 14.3 12.0*  HCT 39.0 34.6*  MCV 80.9 83.6  PLT 194 154    Cardiac Enzymes: No results for input(s): CKTOTAL, CKMB, CKMBINDEX, TROPONINI in the last 168 hours.  Lipid Panel: No results for input(s): CHOL, TRIG, HDL, CHOLHDL, VLDL, LDLCALC in the last 168 hours.  CBG:  Recent Labs Lab 01/19/15 0814  GLUCAP 102*    Microbiology: Results for orders placed or performed during the hospital encounter of 01/18/15  MRSA PCR Screening     Status: Abnormal   Collection Time: 01/18/15  8:39 PM  Result Value Ref Range Status  MRSA by PCR POSITIVE (A) NEGATIVE Final    Comment:        The GeneXpert MRSA Assay (FDA approved for NASAL specimens only), is one component of a comprehensive MRSA colonization surveillance program. It is not intended to diagnose MRSA infection nor to guide or monitor treatment for MRSA infections. RESULT CALLED TO, READ BACK BY AND VERIFIED WITH: Janene Madeira [id number] @ 2300 BY J SCOTTON     Coagulation Studies: No results for input(s): LABPROT, INR in the last 72 hours.  Imaging: Ct Head Wo Contrast  01/18/2015   CLINICAL DATA:  New onset grand mal seizure. Dizziness and headache postictal  EXAM: CT HEAD  WITHOUT CONTRAST  TECHNIQUE: Contiguous axial images were obtained from the base of the skull through the vertex without intravenous contrast.  COMPARISON:  None.  FINDINGS: There is mild diffuse atrophy. There is no intracranial mass, hemorrhage, extra-axial fluid collection, or midline shift. Gray-white compartments are normal. No acute infarct apparent. Bony calvarium appears intact. The mastoid air cells are clear.  IMPRESSION: Mild diffuse atrophy.  Study otherwise unremarkable.   Electronically Signed   By: Lowella Grip III M.D.   On: 01/18/2015 14:53    Medications:  I have reviewed the patient's current medications. Scheduled: . acamprosate  666 mg Oral TID WC  . Chlorhexidine Gluconate Cloth  6 each Topical Q0600  . feeding supplement (ENSURE ENLIVE)  237 mL Oral BID BM  . FLUoxetine  80 mg Oral Daily  . folic acid  1 mg Oral Daily  . gabapentin  300 mg Oral BID  . heparin  5,000 Units Subcutaneous 3 times per day  . ipratropium-albuterol  3 mL Nebulization BID  . metoprolol tartrate  25 mg Oral BID  . multivitamin with minerals  1 tablet Oral Daily  . mupirocin ointment  1 application Nasal BID  . nicotine  21 mg Transdermal Daily  . pantoprazole  40 mg Oral BID  . phosphorus  500 mg Oral BID  . potassium chloride  10 mEq Intravenous Q1 Hr x 4  . potassium chloride  40 mEq Oral Q3H  . sodium chloride  3 mL Intravenous Q12H  . thiamine  100 mg Oral Daily   Or  . thiamine  100 mg Intravenous Daily  . trazodone  300 mg Oral QHS    Assessment/Plan: No further seizures.  Sodium improving.  Patient remains lethargic but arousable.  Continues to complain of right sided numbness.  Anticonvulsant therapy not indicated at this time.  EEG and MRI pending.  Recommendations: 1.  Agree with continued addressing of metabolic issues.   2.  Continue seizure precautions and prn Ativan 3.  Will follow up results of MRI and EEG.   LOS: 1 day   Alexis Goodell, MD [facility] [phone] 01/19/2015  11:32 AM

## 2015-01-19 NOTE — Progress Notes (Signed)
Offsite EEG completed at [facility], results pending. 

## 2015-01-19 NOTE — Progress Notes (Signed)
Patient sat had dropped to 87-88. RT increased by 1-lpm.

## 2015-01-20 DIAGNOSIS — I10 Essential (primary) hypertension: Secondary | ICD-10-CM

## 2015-01-20 LAB — BASIC METABOLIC PANEL
ANION GAP: 8 (ref 5–15)
Anion gap: 6 (ref 5–15)
Anion gap: 4 — ABNORMAL LOW (ref 5–15)
Anion gap: 6 (ref 5–15)
BUN: 10 mg/dL (ref 6–20)
BUN: 9 mg/dL (ref 6–20)
BUN: 9 mg/dL (ref 6–20)
BUN: 8 mg/dL (ref 6–20)
CALCIUM: 7.8 mg/dL — AB (ref 8.9–10.3)
CALCIUM: 7.8 mg/dL — AB (ref 8.9–10.3)
CO2: 34 mmol/L — ABNORMAL HIGH (ref 22–32)
CO2: 34 mmol/L — AB (ref 22–32)
CO2: 32 mmol/L (ref 22–32)
CO2: 33 mmol/L — AB (ref 22–32)
CREATININE: 0.73 mg/dL (ref 0.61–1.24)
CREATININE: 0.58 mg/dL — AB (ref 0.61–1.24)
Calcium: 7.7 mg/dL — ABNORMAL LOW (ref 8.9–10.3)
Calcium: 8 mg/dL — ABNORMAL LOW (ref 8.9–10.3)
Chloride: 83 mmol/L — ABNORMAL LOW (ref 101–111)
Chloride: 86 mmol/L — ABNORMAL LOW (ref 101–111)
Chloride: 87 mmol/L — ABNORMAL LOW (ref 101–111)
Chloride: 92 mmol/L — ABNORMAL LOW (ref 101–111)
Creatinine, Ser: 0.71 mg/dL (ref 0.61–1.24)
Creatinine, Ser: 0.62 mg/dL (ref 0.61–1.24)
GFR calc Af Amer: >60 mL/min (ref >60)
GFR calc Af Amer: >60 mL/min (ref >60)
GFR calc Af Amer: >60 mL/min (ref >60)
GFR calc non Af Amer: >60 mL/min (ref >60)
GFR calc non Af Amer: >60 mL/min (ref >60)
GLUCOSE: 114 mg/dL — AB (ref 65–99)
Glucose, Bld: 108 mg/dL — ABNORMAL HIGH (ref 65–99)
Glucose, Bld: 101 mg/dL — ABNORMAL HIGH (ref 65–99)
Glucose, Bld: 107 mg/dL — ABNORMAL HIGH (ref 65–99)
POTASSIUM: 4.4 mmol/L (ref 3.5–5.1)
Potassium: 3.9 mmol/L (ref 3.5–5.1)
Potassium: 3.7 mmol/L (ref 3.5–5.1)
Potassium: 3.6 mmol/L (ref 3.5–5.1)
SODIUM: 124 mmol/L — AB (ref 135–145)
SODIUM: 131 mmol/L — AB (ref 135–145)
Sodium: 123 mmol/L — ABNORMAL LOW (ref 135–145)
Sodium: 127 mmol/L — ABNORMAL LOW (ref 135–145)

## 2015-01-20 LAB — HEMOGLOBIN A1C
Hgb A1c MFr Bld: 5.6 % (ref 4.8–5.6)
Mean Plasma Glucose: 114 mg/dL

## 2015-01-20 LAB — GLUCOSE, CAPILLARY: GLUCOSE-CAPILLARY: 124 mg/dL — AB (ref 65–99)

## 2015-01-20 LAB — AMMONIA: Ammonia: 23 umol/L (ref 9–35)

## 2015-01-20 MED ORDER — SODIUM CHLORIDE 0.9 % IV SOLN
INTRAVENOUS | Status: DC
  Administered 2015-01-20 – 2015-01-21 (×4): via INTRAVENOUS

## 2015-01-20 MED ORDER — POTASSIUM CHLORIDE CRYS ER 20 MEQ PO TBCR
30.0000 meq | EXTENDED_RELEASE_TABLET | ORAL | Status: AC
  Administered 2015-01-20 (×2): 30 meq via ORAL
  Filled 2015-01-20 (×3): qty 1

## 2015-01-20 MED ORDER — CHLORHEXIDINE GLUCONATE CLOTH 2 % EX PADS
6.0000 | MEDICATED_PAD | Freq: Every day | CUTANEOUS | Status: DC
  Administered 2015-01-20 – 2015-01-21 (×2): 6 via TOPICAL

## 2015-01-20 NOTE — Progress Notes (Signed)
Patient Demographics  Samuel Reyes, is a 59 y.o. male, DOB - 09-14-1955, NLZ:[id number]  Admit date - 01/18/2015   Admitting Physician Allyne Gee, MD  Outpatient Primary MD for the patient is Lorayne Marek, MD  LOS - 2   Chief Complaint  Patient presents with  . Seizures       Admission HPI/Brief narrative: 59 year old male with history of alcohol abuse and COPD, presents with seizures, poor Historian, multiple labs abnormalities including hyponatremia, hypokalemia, no recurrence of seizure during hospital stay, lab abnormalities corrected.  Subjective:   Samuel Reyes today has, No headache, No chest pain, No abdominal pain - No Nausea, No Cough - SOB.   Assessment & Plan    Active Problems:   Essential hypertension   COPD (chronic obstructive pulmonary disease)   Hypokalemia   Hyponatremia   Protein-calorie malnutrition, severe   Seizures   New onset seizure  Severe hyponatremia -Improved, gradually correcting, Samuel Reyes today is 127. - Continue with IV normal saline at 50 ml/hr to avoid rapid correction  -  likely related to volume depletion versus beer potomania  Seizures - Related to hypernatremia, possibility of alcohol withdrawal, as patient alcohol level was within normal limits upon presentation. - Neurology consult appreciate, EEG unremarkable  and MRI brain with no acute change - no indication for anticonvulsive therapy at this time.  alcohol abuse  - Counseled, continue with CIWA protocol  Protein calorie malnutrition - Continue ensure  Hypokalemia - Repleted, monitor closely.  Hypophosphatemia -  mild, repleting  Hypertension - Acceptable on metoprolol  COPD - No wheezing  Code Status: Full  Family Communication: Discussed with patient, none at bedside  Disposition Plan:Posterior to medical floor  Procedures  None   Consults    Neurology   Medications  Scheduled Meds: . acamprosate  666 mg Oral TID WC  . Chlorhexidine Gluconate Cloth  6 each Topical Q0600  . feeding supplement (ENSURE ENLIVE)  237 mL Oral BID BM  . FLUoxetine  80 mg Oral Daily  . folic acid  1 mg Oral Daily  . gabapentin  300 mg Oral BID  . heparin  5,000 Units Subcutaneous 3 times per day  . ipratropium-albuterol  3 mL Nebulization BID  . metoprolol tartrate  25 mg Oral BID  . multivitamin with minerals  1 tablet Oral Daily  . mupirocin ointment  1 application Nasal BID  . nicotine  21 mg Transdermal Daily  . pantoprazole  40 mg Oral BID  . phosphorus  500 mg Oral BID  . potassium chloride  30 mEq Oral Q4H  . sodium chloride  3 mL Intravenous Q12H  . thiamine  100 mg Oral Daily   Or  . thiamine  100 mg Intravenous Daily  . trazodone  300 mg Oral QHS   Continuous Infusions: . sodium chloride 10 mL/hr at 01/19/15 1749  . sodium chloride 50 mL/hr at 01/20/15 0745   PRN Meds:.LORazepam **OR** LORazepam, ondansetron **OR** ondansetron (ZOFRAN) IV, oxyCODONE  DVT Prophylaxis   Heparin   Lab Results  Component Value Date   PLT 154 01/19/2015    Antibiotics    Anti-infectives    None          Objective:   Filed  Vitals:   01/20/15 0800 01/20/15 0900 01/20/15 1000 01/20/15 1200  BP: 120/63 88/58 88/56    Pulse: 77 72 70   Temp: 98.2 F (36.8 C)   98.2 F (36.8 C)  TempSrc: Oral   Oral  Resp: 14 14 16    Height:      Weight:      SpO2: 97% 91% 98%     Wt Readings from Last 3 Encounters:  01/18/15 63.8 kg (140 lb 10.5 oz)  01/05/15 65.726 kg (144 lb 14.4 oz)  12/10/14 70.217 kg (154 lb 12.8 oz)     Intake/Output Summary (Last 24 hours) at 01/20/15 1242 Last data filed at 01/20/15 0600  Gross per 24 hour  Intake 1199.17 ml  Output   1800 ml  Net -600.83 ml     Physical Exam  Awake Alert, Oriented , No new F.N deficits, Normal affect Samuel Reyes.AT,PERRAL Supple Neck,No JVD, No cervical lymphadenopathy  appriciated.  Symmetrical Chest wall movement, Good air movement bilaterally, CTAB RRR,No Gallops,Rubs or new Murmurs, No Parasternal Heave +ve B.Sounds, Abd Soft, No tenderness, No organomegaly appriciated, No rebound - guarding or rigidity. No Cyanosis, Clubbing or edema, No new Rash or bruise  , no tremors   Data Review   Micro Results Recent Results (from the past 240 hour(s))  MRSA PCR Screening     Status: Abnormal   Collection Time: 01/18/15  8:39 PM  Result Value Ref Range Status   MRSA by PCR POSITIVE (A) NEGATIVE Final    Comment:        The GeneXpert MRSA Assay (FDA approved for NASAL specimens only), is one component of a comprehensive MRSA colonization surveillance program. It is not intended to diagnose MRSA infection nor to guide or monitor treatment for MRSA infections. RESULT CALLED TO, READ BACK BY AND VERIFIED WITH: Janene Madeira [id number] @ [place]     Radiology Reports Dg Chest 2 View  01/04/2015   CLINICAL DATA:  Chest pain. Initial encounter. Sharp substernal chest pain onset of symptoms 2 hr ago. Short of breath and nausea.  EXAM: CHEST  2 VIEW  COMPARISON:  12/10/2014.  FINDINGS: Cardiopericardial silhouette within normal limits. Mediastinal contours normal. Trachea midline. No airspace disease or effusion. Surgical clips in the upper abdomen. Truncation of the distal LEFT clavicle, likely postsurgical.  IMPRESSION: No active cardiopulmonary disease.   Electronically Signed   By: Dereck Ligas M.D.   On: 01/04/2015 15:20   Ct Head Wo Contrast  01/18/2015   CLINICAL DATA:  New onset grand mal seizure. Dizziness and headache postictal  EXAM: CT HEAD WITHOUT CONTRAST  TECHNIQUE: Contiguous axial images were obtained from the base of the skull through the vertex without intravenous contrast.  COMPARISON:  None.  FINDINGS: There is mild diffuse atrophy. There is no intracranial mass, hemorrhage, extra-axial fluid collection, or midline shift.  Gray-white compartments are normal. No acute infarct apparent. Bony calvarium appears intact. The mastoid air cells are clear.  IMPRESSION: Mild diffuse atrophy.  Study otherwise unremarkable.   Electronically Signed   By: Lowella Grip III M.D.   On: 01/18/2015 14:53   Mr Jeri Cos RK Contrast  01/19/2015   CLINICAL DATA:  New onset seizures. Lethargy. Right face and arm numbness. History of alcohol abuse.  EXAM: MRI HEAD WITHOUT AND WITH CONTRAST  TECHNIQUE: Multiplanar, multiecho pulse sequences of the brain and surrounding structures were obtained without and with intravenous contrast.  CONTRAST:  28mL MULTIHANCE GADOBENATE DIMEGLUMINE 529 MG/ML IV SOLN  COMPARISON:  Head CT 01/18/2015  FINDINGS: Dedicated thin section imaging through the temporal lobes demonstrates symmetric volume and normal signal of the hippocampi. There is mild-to-moderate generalized cerebral atrophy, advanced for age. There is no evidence of acute infarct, intracranial hemorrhage, mass, midline shift, or extra-axial fluid collection. No significant cerebral white matter disease is identified. There is mild patchy T2 hyperintensity in the pons which is nonspecific but may reflect mild chronic small vessel ischemic change.  Orbits are unremarkable. Trace right mastoid effusion is noted. Paranasal sinuses are clear. Major intracranial vascular flow voids are preserved.  IMPRESSION: 1. No acute intracranial abnormality. No etiology of seizures identified. 2. Age advanced cerebral atrophy.   Electronically Signed   By: Logan Bores   On: 01/19/2015 15:00   Dg Abd Acute W/chest  01/04/2015   CLINICAL DATA:  Mid chest pain.  Symptoms for 1 week.  EXAM: DG ABDOMEN ACUTE W/ 1V CHEST  COMPARISON:  Plain films earlier today.  FINDINGS: Scattered air-fluid levels throughout small and large bowel without definite obstruction. Increased number of visible small bowel loops is also abnormal. No free air is observed. Surgical clips surround the GE  junction. Radiodense material in LEFT upper quadrant likely ingested within the stomach. Heart size and mediastinal contours are within normal limits. Both lungs are clear.  IMPRESSION: Suspected ileus. No acute cardiopulmonary disease. Stable appearance of the chest from priors.   Electronically Signed   By: Rolla Flatten M.D.   On: 01/04/2015 20:01     CBC  Recent Labs Lab 01/18/15 1437 01/19/15 0340  WBC 11.2* 6.3  HGB 14.3 12.0*  HCT 39.0 34.6*  PLT 194 154  MCV 80.9 83.6  MCH 29.7 29.0  MCHC 36.7* 34.7  RDW 16.2* 16.5*  LYMPHSABS 1.5  --   MONOABS 0.4  --   EOSABS 0.0  --   BASOSABS 0.0  --     Chemistries   Recent Labs Lab 01/18/15 1606  01/19/15 0340  01/19/15 1130 01/19/15 1535 01/19/15 1940 01/19/15 2328 01/20/15 0332  NA 114*  < > 123*  < > 123* 125* 123* 124* 127*  K <2.0*  < > 2.2*  < > 2.8* 2.9* 3.9 3.7 3.6  CL <65*  < > 76*  < > 77* 82* 83* 86* 87*  CO2 41*  < > 38*  < > 37* 35* 34* 34* 32  GLUCOSE 113*  < > 93  < > 94 98 114* 108* 101*  BUN 11  < > 7  < > 8 8 10 9 9   CREATININE 0.75  < > 0.71  < > 0.63 0.74 0.71 0.73 0.58*  CALCIUM 7.1*  < > 7.3*  < > 7.7* 7.7* 7.7* 7.8* 7.8*  MG 2.1  --   --   --   --   --   --   --   --   AST 26  --  30  --   --   --   --   --   --   ALT 28  --  30  --   --   --   --   --   --   ALKPHOS 60  --  53  --   --   --   --   --   --   BILITOT 0.8  --  0.6  --   --   --   --   --   --   < > =  values in this interval not displayed. ------------------------------------------------------------------------------------------------------------------ estimated creatinine clearance is 83.3 mL/min (by C-G formula based on Cr of 0.58). ------------------------------------------------------------------------------------------------------------------  Recent Labs  01/18/15 2136  HGBA1C 5.6   ------------------------------------------------------------------------------------------------------------------ No results for input(s):  CHOL, HDL, LDLCALC, TRIG, CHOLHDL, LDLDIRECT in the last 72 hours. ------------------------------------------------------------------------------------------------------------------  Recent Labs  01/18/15 2136  TSH 0.962   ------------------------------------------------------------------------------------------------------------------ No results for input(s): VITAMINB12, FOLATE, FERRITIN, TIBC, IRON, RETICCTPCT in the last 72 hours.  Coagulation profile No results for input(s): INR, PROTIME in the last 168 hours.  No results for input(s): DDIMER in the last 72 hours.  Cardiac Enzymes No results for input(s): CKMB, TROPONINI, MYOGLOBIN in the last 168 hours.  Invalid input(s): CK ------------------------------------------------------------------------------------------------------------------ Invalid input(s): POCBNP     Time Spent in minutes   30  Minutes    Raquel Sayres M.D on 01/20/2015 at 12:42 PM  Between 7am to 7pm - Pager - [phone]  After 7pm go to [email or web address] - password [facility]  [facility]   Office  [phone]

## 2015-01-20 NOTE — Progress Notes (Signed)
Subjective: No further seizures noted.  Patient less lethargic today.    Objective: Current vital signs: BP 88/56 mmHg  Pulse 70  Temp(Src) 98.2 F (36.8 C) (Oral)  Resp 16  Ht 5\' 4"  (1.626 m)  Wt 63.8 kg (140 lb 10.5 oz)  BMI 24.13 kg/m2  SpO2 98% Vital signs in last 24 hours: Temp:  [98 F (36.7 C)-98.9 F (37.2 C)] 98.2 F (36.8 C) (06/23 0800) Pulse Rate:  [66-85] 70 (06/23 1000) Resp:  [14-27] 16 (06/23 1000) BP: (85-143)/(47-78) 88/56 mmHg (06/23 1000) SpO2:  [88 %-100 %] 98 % (06/23 1000)  Intake/Output from previous day: 06/22 0701 - 06/23 0700 In: 1799.2 [P.O.:500; I.V.:499.2; IV Piggyback:800] Out: 1800 [Urine:1800] Intake/Output this shift:   Nutritional status: Diet heart healthy/carb modified Room service appropriate?: Yes; Fluid consistency:: Thin  Neurologic Exam: Mental Status: Lethargic, oriented, thought content appropriate. Speech fluent without evidence of aphasia. Able to follow 3 step commands without difficulty. Cranial Nerves: II: Discs flat bilaterally; Visual fields grossly normal, pupils equal, round, reactive to light and accommodation III,IV, VI: ptosis not present, extra-ocular motions intact bilaterally V,VII: smile symmetric, facial light touch sensation decreased on the right side of the face VIII: hearing normal bilaterally IX,X: gag reflex present XI: bilateral shoulder shrug XII: midline tongue extension Motor: Lifts all extremties against gravity and shows no focal weakness.    Lab Results: Basic Metabolic Panel:  Recent Labs Lab 01/18/15 1606  01/19/15 1130 01/19/15 1535 01/19/15 1940 01/19/15 2328 01/20/15 0332  NA 114*  < > 123* 125* 123* 124* 127*  K <2.0*  < > 2.8* 2.9* 3.9 3.7 3.6  CL <65*  < > 77* 82* 83* 86* 87*  CO2 41*  < > 37* 35* 34* 34* 32  GLUCOSE 113*  < > 94 98 114* 108* 101*  BUN 11  < > 8 8 10 9 9   CREATININE 0.75  < > 0.63 0.74 0.71 0.73 0.58*  CALCIUM 7.1*  < > 7.7* 7.7* 7.7* 7.8* 7.8*  MG  2.1  --   --   --   --   --   --   PHOS 2.4*  --   --   --   --   --   --   < > = values in this interval not displayed.  Liver Function Tests:  Recent Labs Lab 01/18/15 1606 01/19/15 0340  AST 26 30  ALT 28 30  ALKPHOS 60 53  BILITOT 0.8 0.6  PROT 5.6* 5.0*  ALBUMIN 2.9* 2.7*    Recent Labs Lab 01/18/15 1606  LIPASE 12*   No results for input(s): AMMONIA in the last 168 hours.  CBC:  Recent Labs Lab 01/18/15 1437 01/19/15 0340  WBC 11.2* 6.3  NEUTROABS 9.3*  --   HGB 14.3 12.0*  HCT 39.0 34.6*  MCV 80.9 83.6  PLT 194 154    Cardiac Enzymes: No results for input(s): CKTOTAL, CKMB, CKMBINDEX, TROPONINI in the last 168 hours.  Lipid Panel: No results for input(s): CHOL, TRIG, HDL, CHOLHDL, VLDL, LDLCALC in the last 168 hours.  CBG:  Recent Labs Lab 01/19/15 0814 01/20/15 0746  GLUCAP 102* 124*    Microbiology: Results for orders placed or performed during the hospital encounter of 01/18/15  MRSA PCR Screening     Status: Abnormal   Collection Time: 01/18/15  8:39 PM  Result Value Ref Range Status   MRSA by PCR POSITIVE (A) NEGATIVE Final    Comment:  The GeneXpert MRSA Assay (FDA approved for NASAL specimens only), is one component of a comprehensive MRSA colonization surveillance program. It is not intended to diagnose MRSA infection nor to guide or monitor treatment for MRSA infections. RESULT CALLED TO, READ BACK BY AND VERIFIED WITH: Janene Madeira [id number] @ 2300 BY J SCOTTON     Coagulation Studies: No results for input(s): LABPROT, INR in the last 72 hours.  Imaging: Ct Head Wo Contrast  01/18/2015   CLINICAL DATA:  New onset grand mal seizure. Dizziness and headache postictal  EXAM: CT HEAD WITHOUT CONTRAST  TECHNIQUE: Contiguous axial images were obtained from the base of the skull through the vertex without intravenous contrast.  COMPARISON:  None.  FINDINGS: There is mild diffuse atrophy. There is no intracranial mass,  hemorrhage, extra-axial fluid collection, or midline shift. Gray-white compartments are normal. No acute infarct apparent. Bony calvarium appears intact. The mastoid air cells are clear.  IMPRESSION: Mild diffuse atrophy.  Study otherwise unremarkable.   Electronically Signed   By: Lowella Grip III M.D.   On: 01/18/2015 14:53   Mr Jeri Cos IR Contrast  01/19/2015   CLINICAL DATA:  New onset seizures. Lethargy. Right face and arm numbness. History of alcohol abuse.  EXAM: MRI HEAD WITHOUT AND WITH CONTRAST  TECHNIQUE: Multiplanar, multiecho pulse sequences of the brain and surrounding structures were obtained without and with intravenous contrast.  CONTRAST:  59mL MULTIHANCE GADOBENATE DIMEGLUMINE 529 MG/ML IV SOLN  COMPARISON:  Head CT 01/18/2015  FINDINGS: Dedicated thin section imaging through the temporal lobes demonstrates symmetric volume and normal signal of the hippocampi. There is mild-to-moderate generalized cerebral atrophy, advanced for age. There is no evidence of acute infarct, intracranial hemorrhage, mass, midline shift, or extra-axial fluid collection. No significant cerebral white matter disease is identified. There is mild patchy T2 hyperintensity in the pons which is nonspecific but may reflect mild chronic small vessel ischemic change.  Orbits are unremarkable. Trace right mastoid effusion is noted. Paranasal sinuses are clear. Major intracranial vascular flow voids are preserved.  IMPRESSION: 1. No acute intracranial abnormality. No etiology of seizures identified. 2. Age advanced cerebral atrophy.   Electronically Signed   By: Logan Bores   On: 01/19/2015 15:00    Medications:  I have reviewed the patient's current medications. Scheduled: . acamprosate  666 mg Oral TID WC  . Chlorhexidine Gluconate Cloth  6 each Topical Q0600  . feeding supplement (ENSURE ENLIVE)  237 mL Oral BID BM  . FLUoxetine  80 mg Oral Daily  . folic acid  1 mg Oral Daily  . gabapentin  300 mg Oral BID   . heparin  5,000 Units Subcutaneous 3 times per day  . ipratropium-albuterol  3 mL Nebulization BID  . metoprolol tartrate  25 mg Oral BID  . multivitamin with minerals  1 tablet Oral Daily  . mupirocin ointment  1 application Nasal BID  . nicotine  21 mg Transdermal Daily  . pantoprazole  40 mg Oral BID  . phosphorus  500 mg Oral BID  . sodium chloride  3 mL Intravenous Q12H  . thiamine  100 mg Oral Daily   Or  . thiamine  100 mg Intravenous Daily  . trazodone  300 mg Oral QHS    Assessment/Plan: No further seizures.  Sodium improving.  MRI personally reviewed and shows no acute changes.  EEG unremarkable.  No indication for anticonvulsant therapy at this time.  Seizure likely provoked.    Recommendations: 1.  No further neurologic intervention is recommended at this time.  If further questions arise, please call or page at that time.  Thank you for allowing neurology to participate in the care of this patient.    LOS: 2 days   Alexis Goodell, MD [facility] [phone] 01/20/2015  11:19 AM

## 2015-01-21 LAB — GLUCOSE, CAPILLARY: GLUCOSE-CAPILLARY: 133 mg/dL — AB (ref 65–99)

## 2015-01-21 LAB — BASIC METABOLIC PANEL
ANION GAP: 6 (ref 5–15)
BUN: 10 mg/dL (ref 6–20)
CO2: 30 mmol/L (ref 22–32)
CREATININE: 0.7 mg/dL (ref 0.61–1.24)
Calcium: 8 mg/dL — ABNORMAL LOW (ref 8.9–10.3)
Chloride: 96 mmol/L — ABNORMAL LOW (ref 101–111)
GFR calc non Af Amer: >60 mL/min (ref >60)
Glucose, Bld: 100 mg/dL — ABNORMAL HIGH (ref 65–99)
POTASSIUM: 4.5 mmol/L (ref 3.5–5.1)
Sodium: 132 mmol/L — ABNORMAL LOW (ref 135–145)

## 2015-01-21 LAB — CBC
HEMATOCRIT: 35 % — AB (ref 39.0–52.0)
Hemoglobin: 11.5 g/dL — ABNORMAL LOW (ref 13.0–17.0)
MCH: 29.5 pg (ref 26.0–34.0)
MCHC: 32.9 g/dL (ref 30.0–36.0)
MCV: 89.7 fL (ref 78.0–100.0)
Platelets: 223 10*3/uL (ref 150–400)
RBC: 3.9 MIL/uL — AB (ref 4.22–5.81)
RDW: 18.4 % — ABNORMAL HIGH (ref 11.5–15.5)
WBC: 7.1 10*3/uL (ref 4.0–10.5)

## 2015-01-21 LAB — PHOSPHORUS: PHOSPHORUS: 3.2 mg/dL (ref 2.5–4.6)

## 2015-01-21 MED ORDER — ACETAMINOPHEN 325 MG PO TABS
325.0000 mg | ORAL_TABLET | ORAL | Status: DC | PRN
  Administered 2015-01-21: 325 mg via ORAL
  Filled 2015-01-21: qty 1

## 2015-01-21 NOTE — Progress Notes (Signed)
Patient refused to ambulate at this time. At rest O2 sat was 97%, denies SOB. Will continue to monitor the patient.

## 2015-01-21 NOTE — Progress Notes (Signed)
Santiago Glad -PT notified on patient order for PT.

## 2015-01-21 NOTE — Evaluation (Signed)
Physical Therapy Evaluation Patient Details Name: Samuel Reyes MRN: [id number] DOB: [birth year] Today's Date: 01/21/2015   History of Present Illness  60 year old male with history of alcohol abuse and COPD, presents with seizures 6/21.  Clinical Impression  Patient ambulated in hall with RW, did not attempt without RW today. Patient will benefit from PT to address problems listed in note below.    Follow Up Recommendations No PT follow up;Supervision - Intermittent    Equipment Recommendations  None recommended by PT    Recommendations for Other Services       Precautions / Restrictions Precautions Precautions: Fall Precaution Comments: seizure      Mobility  Bed Mobility Overal bed mobility: Independent                Transfers Overall transfer level: Needs assistance Equipment used: Rolling walker (2 wheeled) Transfers: Sit to/from Stand Sit to Stand: Supervision            Ambulation/Gait Ambulation/Gait assistance: Supervision Ambulation Distance (Feet): 100 Feet Assistive device: Rolling walker (2 wheeled) Gait Pattern/deviations: Step-through pattern     General Gait Details: manages RW in hall,  cues for turning  Stairs            Wheelchair Mobility    Modified Rankin (Stroke Patients Only)       Balance Overall balance assessment: Needs assistance         Standing balance support: During functional activity;Bilateral upper extremity supported Standing balance-Leahy Scale: Good                               Pertinent Vitals/Pain Pain Assessment: No/denies pain    Home Living Family/patient expects to be discharged to:: Private residence Living Arrangements: Non-relatives/Friends (per patient "2 roommates") Available Help at Discharge: Friend(s) Type of Home: House Home Access: Stairs to enter   Technical brewer of Steps: several Home Layout: One level Home Equipment: Cane - single point       Prior Function Level of Independence: Independent               Hand Dominance        Extremity/Trunk Assessment   Upper Extremity Assessment: Generalized weakness           Lower Extremity Assessment: Generalized weakness         Communication      Cognition Arousal/Alertness: Awake/alert Behavior During Therapy: WFL for tasks assessed/performed   Area of Impairment: Orientation Orientation Level: Disoriented to;Time             General Comments: did not know when he came to hospital    General Comments      Exercises        Assessment/Plan    PT Assessment    PT Diagnosis Generalized weakness;Difficulty walking   PT Problem List    PT Treatment Interventions     PT Goals (Current goals can be found in the Care Plan section) Acute Rehab PT Goals Patient Stated Goal: none stated PT Goal Formulation: With patient Time For Goal Achievement: 02/04/15 Potential to Achieve Goals: Good    Frequency     Barriers to discharge        Co-evaluation               End of Session   Activity Tolerance: Patient tolerated treatment well Patient left: in chair;with nursing/sitter in room Nurse Communication: Mobility status  Time: 1130-1145 PT Time Calculation (min) (ACUTE ONLY): 15 min   Charges:   PT Evaluation $Initial PT Evaluation Tier I: 1 Procedure     PT G CodesClaretha Cooper 01/21/2015, 11:57 AM Tresa Endo PT [phone]

## 2015-01-21 NOTE — Progress Notes (Signed)
Patient Demographics  Samuel Reyes, is a 59 y.o. male, DOB - [birth year], FWY:[id number]  Admit date - 01/18/2015   Admitting Physician Allyne Gee, MD  Outpatient Primary MD for the patient is Lorayne Marek, MD  LOS - 3   Chief Complaint  Patient presents with  . Seizures       Admission HPI/Brief narrative: 59 year old male with history of alcohol abuse and COPD, presents with seizures, poor Historian, multiple labs abnormalities including hyponatremia, hypokalemia, no recurrence of seizure during hospital stay, lab abnormalities corrected.  Subjective:   Samuel Reyes today has, No headache, No chest pain, No abdominal pain - No Nausea, No Cough - SOB.   Assessment & Plan    Active Problems:   Essential hypertension   COPD (chronic obstructive pulmonary disease)   Hypokalemia   Hyponatremia   Protein-calorie malnutrition, severe   Seizures   New onset seizure  Severe hyponatremia -Improved, gradually correcting, sodium is 132 today,only corrected from 127 to 132 yesterday so  will increase normal saline at 75 ml/hr , -  likely related to volume depletion versus beer potomania  Seizures - Related to hypernatremia, possibility of alcohol withdrawal, as patient alcohol level was within normal limits upon presentation. - Neurology consult appreciate, EEG unremarkable  and MRI brain with no acute change - no indication for anticonvulsive therapy at this time.  alcohol abuse  - Counseled, continue with CIWA protocol, no Ativan required over the last 24 hours .  Protein calorie malnutrition - Continue ensure  Hypokalemia - Repleted, monitor closely.  Hypophosphatemia -  Repleted  Hypertension - blood pressure is on the lower side, will discontinue metoprolol  COPD - wean off oxygen - No wheezing  Code Status: Full  Family Communication: Discussed with patient, none  at bedside  Disposition Plan: Plan to discharge in 1-2 days .  Procedures  None   Consults   Neurology   Medications  Scheduled Meds: . acamprosate  666 mg Oral TID WC  . Chlorhexidine Gluconate Cloth  6 each Topical Q0600  . feeding supplement (ENSURE ENLIVE)  237 mL Oral BID BM  . FLUoxetine  80 mg Oral Daily  . folic acid  1 mg Oral Daily  . gabapentin  300 mg Oral BID  . heparin  5,000 Units Subcutaneous 3 times per day  . ipratropium-albuterol  3 mL Nebulization BID  . metoprolol tartrate  25 mg Oral BID  . multivitamin with minerals  1 tablet Oral Daily  . mupirocin ointment  1 application Nasal BID  . nicotine  21 mg Transdermal Daily  . pantoprazole  40 mg Oral BID  . sodium chloride  3 mL Intravenous Q12H  . thiamine  100 mg Oral Daily  . traZODone  300 mg Oral QHS   Continuous Infusions: . sodium chloride 50 mL/hr at 01/20/15 1635   PRN Meds:.acetaminophen, LORazepam **OR** LORazepam, ondansetron **OR** ondansetron (ZOFRAN) IV, oxyCODONE  DVT Prophylaxis   Heparin   Lab Results  Component Value Date   PLT 223 01/21/2015    Antibiotics    Anti-infectives    None          Objective:   Filed Vitals:   01/21/15 2774 01/21/15 0932 01/21/15 1287 01/21/15 8676  BP:  97/57 97/57   Pulse:  93    Temp:      TempSrc:      Resp:      Height:      Weight:      SpO2: 95%   97%    Wt Readings from Last 3 Encounters:  01/18/15 63.8 kg (140 lb 10.5 oz)  01/05/15 65.726 kg (144 lb 14.4 oz)  12/10/14 70.217 kg (154 lb 12.8 oz)     Intake/Output Summary (Last 24 hours) at 01/21/15 0958 Last data filed at 01/21/15 0846  Gross per 24 hour  Intake 1475.5 ml  Output   1200 ml  Net  275.5 ml     Physical Exam  Awake Alert, Oriented , No new F.N deficits, Normal affect [place].AT,PERRAL Supple Neck,No JVD, No cervical lymphadenopathy appriciated.  Symmetrical Chest wall movement, Good air movement bilaterally, CTAB RRR,No Gallops,Rubs or new  Murmurs, No Parasternal Heave +ve B.Sounds, Abd Soft, No tenderness, No organomegaly appriciated, No rebound - guarding or rigidity. No Cyanosis, Clubbing or edema, No new Rash or bruise  , no tremors   Data Review   Micro Results Recent Results (from the past 240 hour(s))  MRSA PCR Screening     Status: Abnormal   Collection Time: 01/18/15  8:39 PM  Result Value Ref Range Status   MRSA by PCR POSITIVE (A) NEGATIVE Final    Comment:        The GeneXpert MRSA Assay (FDA approved for NASAL specimens only), is one component of a comprehensive MRSA colonization surveillance program. It is not intended to diagnose MRSA infection nor to guide or monitor treatment for MRSA infections. RESULT CALLED TO, READ BACK BY AND VERIFIED WITH: Janene Madeira [id number] @ [place]     Radiology Reports Dg Chest 2 View  01/04/2015   CLINICAL DATA:  Chest pain. Initial encounter. Sharp substernal chest pain onset of symptoms 2 hr ago. Short of breath and nausea.  EXAM: CHEST  2 VIEW  COMPARISON:  12/10/2014.  FINDINGS: Cardiopericardial silhouette within normal limits. Mediastinal contours normal. Trachea midline. No airspace disease or effusion. Surgical clips in the upper abdomen. Truncation of the distal LEFT clavicle, likely postsurgical.  IMPRESSION: No active cardiopulmonary disease.   Electronically Signed   By: Dereck Ligas M.D.   On: 01/04/2015 15:20   Ct Head Wo Contrast  01/18/2015   CLINICAL DATA:  New onset grand mal seizure. Dizziness and headache postictal  EXAM: CT HEAD WITHOUT CONTRAST  TECHNIQUE: Contiguous axial images were obtained from the base of the skull through the vertex without intravenous contrast.  COMPARISON:  None.  FINDINGS: There is mild diffuse atrophy. There is no intracranial mass, hemorrhage, extra-axial fluid collection, or midline shift. Gray-white compartments are normal. No acute infarct apparent. Bony calvarium appears intact. The mastoid air cells  are clear.  IMPRESSION: Mild diffuse atrophy.  Study otherwise unremarkable.   Electronically Signed   By: Lowella Grip III M.D.   On: 01/18/2015 14:53   Mr Jeri Cos CE Contrast  01/19/2015   CLINICAL DATA:  New onset seizures. Lethargy. Right face and arm numbness. History of alcohol abuse.  EXAM: MRI HEAD WITHOUT AND WITH CONTRAST  TECHNIQUE: Multiplanar, multiecho pulse sequences of the brain and surrounding structures were obtained without and with intravenous contrast.  CONTRAST:  46mL MULTIHANCE GADOBENATE DIMEGLUMINE 529 MG/ML IV SOLN  COMPARISON:  Head CT 01/18/2015  FINDINGS: Dedicated thin section imaging through the temporal lobes demonstrates  symmetric volume and normal signal of the hippocampi. There is mild-to-moderate generalized cerebral atrophy, advanced for age. There is no evidence of acute infarct, intracranial hemorrhage, mass, midline shift, or extra-axial fluid collection. No significant cerebral white matter disease is identified. There is mild patchy T2 hyperintensity in the pons which is nonspecific but may reflect mild chronic small vessel ischemic change.  Orbits are unremarkable. Trace right mastoid effusion is noted. Paranasal sinuses are clear. Major intracranial vascular flow voids are preserved.  IMPRESSION: 1. No acute intracranial abnormality. No etiology of seizures identified. 2. Age advanced cerebral atrophy.   Electronically Signed   By: Logan Bores   On: 01/19/2015 15:00   Dg Abd Acute W/chest  01/04/2015   CLINICAL DATA:  Mid chest pain.  Symptoms for 1 week.  EXAM: DG ABDOMEN ACUTE W/ 1V CHEST  COMPARISON:  Plain films earlier today.  FINDINGS: Scattered air-fluid levels throughout small and large bowel without definite obstruction. Increased number of visible small bowel loops is also abnormal. No free air is observed. Surgical clips surround the GE junction. Radiodense material in LEFT upper quadrant likely ingested within the stomach. Heart size and mediastinal  contours are within normal limits. Both lungs are clear.  IMPRESSION: Suspected ileus. No acute cardiopulmonary disease. Stable appearance of the chest from priors.   Electronically Signed   By: Rolla Flatten M.D.   On: 01/04/2015 20:01     CBC  Recent Labs Lab 01/18/15 1437 01/19/15 0340 01/21/15 0356  WBC 11.2* 6.3 7.1  HGB 14.3 12.0* 11.5*  HCT 39.0 34.6* 35.0*  PLT 194 154 223  MCV 80.9 83.6 89.7  MCH 29.7 29.0 29.5  MCHC 36.7* 34.7 32.9  RDW 16.2* 16.5* 18.4*  LYMPHSABS 1.5  --   --   MONOABS 0.4  --   --   EOSABS 0.0  --   --   BASOSABS 0.0  --   --     Chemistries   Recent Labs Lab 01/18/15 1606  01/19/15 0340  01/19/15 1940 01/19/15 2328 01/20/15 0332 01/20/15 1310 01/21/15 0356  NA 114*  < > 123*  < > 123* 124* 127* 131* 132*  K <2.0*  < > 2.2*  < > 3.9 3.7 3.6 4.4 4.5  CL <65*  < > 76*  < > 83* 86* 87* 92* 96*  CO2 41*  < > 38*  < > 34* 34* 32 33* 30  GLUCOSE 113*  < > 93  < > 114* 108* 101* 107* 100*  BUN 11  < > 7  < > 10 9 9 8 10   CREATININE 0.75  < > 0.71  < > 0.71 0.73 0.58* 0.62 0.70  CALCIUM 7.1*  < > 7.3*  < > 7.7* 7.8* 7.8* 8.0* 8.0*  MG 2.1  --   --   --   --   --   --   --   --   AST 26  --  30  --   --   --   --   --   --   ALT 28  --  30  --   --   --   --   --   --   ALKPHOS 60  --  53  --   --   --   --   --   --   BILITOT 0.8  --  0.6  --   --   --   --   --   --   < > =  values in this interval not displayed. ------------------------------------------------------------------------------------------------------------------ estimated creatinine clearance is 83.3 mL/min (by C-G formula based on Cr of 0.7). ------------------------------------------------------------------------------------------------------------------  Recent Labs  01/18/15 2136  HGBA1C 5.6   ------------------------------------------------------------------------------------------------------------------ No results for input(s): CHOL, HDL, LDLCALC, TRIG, CHOLHDL,  LDLDIRECT in the last 72 hours. ------------------------------------------------------------------------------------------------------------------  Recent Labs  01/18/15 2136  TSH 0.962   ------------------------------------------------------------------------------------------------------------------ No results for input(s): VITAMINB12, FOLATE, FERRITIN, TIBC, IRON, RETICCTPCT in the last 72 hours.  Coagulation profile No results for input(s): INR, PROTIME in the last 168 hours.  No results for input(s): DDIMER in the last 72 hours.  Cardiac Enzymes No results for input(s): CKMB, TROPONINI, MYOGLOBIN in the last 168 hours.  Invalid input(s): CK ------------------------------------------------------------------------------------------------------------------ Invalid input(s): POCBNP     Time Spent in minutes   30  Minutes    Brandon Scarbrough M.D on 01/21/2015 at 9:58 AM  Between 7am to 7pm - Pager - [phone]  After 7pm go to [email or web address] - password [facility]  [facility]   Office  [phone]

## 2015-01-22 LAB — BASIC METABOLIC PANEL
ANION GAP: 5 (ref 5–15)
BUN: 11 mg/dL (ref 6–20)
CHLORIDE: 98 mmol/L — AB (ref 101–111)
CO2: 27 mmol/L (ref 22–32)
CREATININE: 0.77 mg/dL (ref 0.61–1.24)
Calcium: 8.1 mg/dL — ABNORMAL LOW (ref 8.9–10.3)
GFR calc Af Amer: >60 mL/min (ref >60)
Glucose, Bld: 118 mg/dL — ABNORMAL HIGH (ref 65–99)
POTASSIUM: 4.4 mmol/L (ref 3.5–5.1)
Sodium: 130 mmol/L — ABNORMAL LOW (ref 135–145)

## 2015-01-22 LAB — MAGNESIUM: Magnesium: 1.8 mg/dL (ref 1.7–2.4)

## 2015-01-22 LAB — GLUCOSE, CAPILLARY: GLUCOSE-CAPILLARY: 96 mg/dL (ref 65–99)

## 2015-01-22 MED ORDER — FOLIC ACID 1 MG PO TABS: 1.0000 mg | ORAL_TABLET | Freq: Every day | ORAL | Status: DC

## 2015-01-22 MED ORDER — THIAMINE HCL 100 MG PO TABS: 100.0000 mg | ORAL_TABLET | Freq: Every day | ORAL | Status: DC

## 2015-01-22 MED ORDER — CHLORDIAZEPOXIDE HCL 10 MG PO CAPS: ORAL_CAPSULE | ORAL | Status: DC

## 2015-01-22 MED ORDER — ENSURE ENLIVE PO LIQD: 237.0000 mL | Freq: Two times a day (BID) | ORAL | Status: DC

## 2015-01-22 MED ORDER — ADULT MULTIVITAMIN W/MINERALS CH: 1.0000 | ORAL_TABLET | Freq: Every day | ORAL | Status: DC

## 2015-01-22 NOTE — Discharge Summary (Signed)
Samuel Reyes, is a 59 y.o. male  DOB 1956-02-02  MRN [id number].  Admission date:  01/18/2015  Admitting Physician  Allyne Gee, MD  Discharge Date:  01/22/2015   Primary MD  Lorayne Marek, MD  Recommendations for primary care physician for things to follow:  - Check basic labs including CBC, BMP during next visit to ensure improvement of hyponatremia.   Admission Diagnosis  Epigastric pain [R10.13] New onset seizure [R56.9]   Discharge Diagnosis  Epigastric pain [R10.13] New onset seizure [R56.9]    Active Problems:   Essential hypertension   COPD (chronic obstructive pulmonary disease)   Hypokalemia   Hyponatremia   Protein-calorie malnutrition, severe   Seizures   New onset seizure      Past Medical History  Diagnosis Date  . Cirrhosis   . Degenerative joint disease   . Hypertension   . Gastric ulcer   . Depression   . Asthma   . Mitral valve prolapse 2002  . H/O hiatal hernia   . COPD (chronic obstructive pulmonary disease)   . TIA (transient ischemic attack)     2010  . Stroke     TIA - 2010 - no deficits   . Shortness of breath     with exertion   . GERD (gastroesophageal reflux disease)   . Headache(784.0)   . Hiatal hernia 1982  . Degenerative joint disease   . Anxiety   . Alcohol abuse     Past Surgical History  Procedure Laterality Date  . Gastrectomy    . Shoulder surgery Bilateral     3 surgeries on on left, 2 surgeries on right   . Rt knee arthroscopic surgery    . Back surgery      3 cervical spine surgeries C4-C5 fused  . Hernia repair    . Finger surgery Left     2nd, 3rd, & 4th fingers were cut off by table saw and reattached  . Colonoscopy N/A 01/04/2014    Procedure: COLONOSCOPY;  Surgeon: Danie Binder, MD;  Location: [facility];  Service: Endoscopy;  Laterality: N/A;  1:45  . Esophagogastroduodenoscopy N/A 01/04/2014    Procedure:  ESOPHAGOGASTRODUODENOSCOPY (EGD);  Surgeon: Danie Binder, MD;  Location: [facility];  Service: Endoscopy;  Laterality: N/A;  . Incisional hernia repair N/A 01/20/2014    Procedure: LAPAROSCOPIC RECURRENT  INCISIONAL HERNIA with mesh;  Surgeon: Edward Jolly, MD;  Location: [facility];  Service: General;  Laterality: N/A;       History of present illness and  Hospital Course:     Kindly see H&P for history of present illness and admission details, please review complete Labs, Consult reports and Test reports for all details in brief  HPI  from the history and physical done on the day of admission by Dr. Devona Konig 01/18/59  Samuel Reyes is a 59 y.o. male with past history of ETOH abuse COPD presents with seizures. Patient not a reliable historian. He staets that he is a  regular drinker had his last drink yesterday. Patient states that he has had seizures before when he stops drinking. Apparently in the ambulance he had a seizure,. Neurology has seen the patient in the ED and he told them he has not had a seizure before. In the ED he had labwork repeated several times which shows severe hyponatremia and severe hypokalemia,. He is awake and answers questions though not reliable. He has no headache. States he has been having vomiting and diarrhea. Also he states he has been having pain in his epigastric area. No hematemesis noted no blood in the stool noted.    Hospital Course  Severe hyponatremia -Significantly Improved, corrected gradually with normal saline, sodium is 130 at day of discharge. - likely related to volume depletion versus beer potomania  Seizures - Related to hypernatremia, possibility of alcohol withdrawal, as patient alcohol level was within normal limits upon presentation. - Neurology consult appreciate, EEG unremarkable and MRI brain with no acute change - no indication for anticonvulsive therapy at this time.  alcohol abuse  - Counseled, treated with CIWA  protocol, no Ativan required over the last 24 hours . No evidence of DTs, will discharge on thiamine, folic acid and Librium taper.  Protein calorie malnutrition - Continue ensure  Hypokalemia - Repleted,   Hypophosphatemia - Repleted  Hypertension - blood pressure is on the lower side, will discontinue metoprolol  COPD - On room air - No wheezing      Discharge Condition:  stable   Follow UP  Follow-up Information    Follow up with Lorayne Marek, MD. Schedule an appointment as soon as possible for a visit in 1 week.   Specialty:  Internal Medicine   Why:  Posthospitalization follow-up   Contact information:   [place] [address] [phone]         Discharge Instructions  and  Discharge Medications     Discharge Instructions    Diet - low sodium heart healthy    Complete by:  As directed      Discharge instructions    Complete by:  As directed   Follow with Primary MD Lorayne Marek, MD in 7 days   Get CBC, CMP, 2 view Chest X ray checked  by Primary MD next visit.    Activity: As tolerated with Full fall precautions use walker/cane & assistance as needed   Disposition Home **   Diet: Heart Healthy ** , with feeding assistance and aspiration precautions.  For Heart failure patients - Check your Weight same time everyday, if you gain over 2 pounds, or you develop in leg swelling, experience more shortness of breath or chest pain, call your Primary MD immediately. Follow Cardiac Low Salt Diet and 1.5 lit/day fluid restriction.   On your next visit with your primary care physician please Get Medicines reviewed and adjusted.   Please request your Prim.MD to go over all Hospital Tests and Procedure/Radiological results at the follow up, please get all Hospital records sent to your Prim MD by signing hospital release before you go home.   If you experience worsening of your admission symptoms, develop shortness of breath, life  threatening emergency, suicidal or homicidal thoughts you must seek medical attention immediately by calling 911 or calling your MD immediately  if symptoms less severe.  You Must read complete instructions/literature along with all the possible adverse reactions/side effects for all the Medicines you take and that have been prescribed to you. Take any new  Medicines after you have completely understood and accpet all the possible adverse reactions/side effects.   Do not drive, operating heavy machinery, perform activities at heights, swimming or participation in water activities or provide baby sitting services if your were admitted for syncope or siezures until you have seen by Primary MD or a Neurologist and advised to do so again.  Do not drive when taking Pain medications.    Do not take more than prescribed Pain, Sleep and Anxiety Medications  Special Instructions: If you have smoked or chewed Tobacco  in the last 2 yrs please stop smoking, stop any regular Alcohol  and or any Recreational drug use.  Wear Seat belts while driving.   Please note  You were cared for by a hospitalist during your hospital stay. If you have any questions about your discharge medications or the care you received while you were in the hospital after you are discharged, you can call the unit and asked to speak with the hospitalist on call if the hospitalist that took care of you is not available. Once you are discharged, your primary care physician will handle any further medical issues. Please note that NO REFILLS for any discharge medications will be authorized once you are discharged, as it is imperative that you return to your primary care physician (or establish a relationship with a primary care physician if you do not have one) for your aftercare needs so that they can reassess your need for medications and monitor your lab values.     Increase activity slowly    Complete by:  As directed               Medication List    STOP taking these medications        aspirin 81 MG chewable tablet     metoprolol tartrate 25 MG tablet  Commonly known as:  LOPRESSOR     naproxen 500 MG tablet  Commonly known as:  NAPROSYN      TAKE these medications        acamprosate 333 MG tablet  Commonly known as:  CAMPRAL  Take 2 tablets (666 mg total) by mouth 3 (three) times daily with meals. For alcoholism     albuterol 108 (90 BASE) MCG/ACT inhaler  Commonly known as:  PROVENTIL HFA;VENTOLIN HFA  Inhale 2 puffs into the lungs every 6 (six) hours as needed for wheezing or shortness of breath.     albuterol-ipratropium 18-103 MCG/ACT inhaler  Commonly known as:  COMBIVENT  Inhale 2 puffs into the lungs 2 (two) times daily. For shortness of breath     chlordiazePOXIDE 10 MG capsule  Commonly known as:  LIBRIUM  Please take 2 tablets (20 mg) oral 3 times daily for 3 days, then take 1 tablet (10 mg) oral 3 times daily for 3 days, then 1 tablet (10 mg ) oral once daily for 3 days then stop.     feeding supplement (ENSURE ENLIVE) Liqd  Take 237 mLs by mouth 2 (two) times daily between meals.     FLUoxetine 40 MG capsule  Commonly known as:  PROZAC  Take 2 capsules (80 mg total) by mouth daily. For depression     folic acid 1 MG tablet  Commonly known as:  FOLVITE  Take 1 tablet (1 mg total) by mouth daily.     gabapentin 300 MG capsule  Commonly known as:  NEURONTIN  Take 1 capsule (300 mg total) by mouth 2 (two) times daily.  For substance withdrawal syndrome     multivitamin with minerals Tabs tablet  Take 1 tablet by mouth daily.     nicotine 21 mg/24hr patch  Commonly known as:  NICODERM CQ - dosed in mg/24 hours  PLACE 1 PATCH ONTO THE SKIN DAILY     pantoprazole 40 MG tablet  Commonly known as:  PROTONIX  Take 1 tablet (40 mg total) by mouth 2 (two) times daily.     thiamine 100 MG tablet  Take 1 tablet (100 mg total) by mouth daily.     traZODone 150 MG tablet  Commonly  known as:  DESYREL  Take 2 tablets (300 mg total) by mouth at bedtime. For sleep          Diet and Activity recommendation: See Discharge Instructions above   Consults obtained -  none   Major procedures and Radiology Reports - PLEASE review detailed and final reports for all details, in brief -      Dg Chest 2 View  01/04/2015   CLINICAL DATA:  Chest pain. Initial encounter. Sharp substernal chest pain onset of symptoms 2 hr ago. Short of breath and nausea.  EXAM: CHEST  2 VIEW  COMPARISON:  12/10/2014.  FINDINGS: Cardiopericardial silhouette within normal limits. Mediastinal contours normal. Trachea midline. No airspace disease or effusion. Surgical clips in the upper abdomen. Truncation of the distal LEFT clavicle, likely postsurgical.  IMPRESSION: No active cardiopulmonary disease.   Electronically Signed   By: Dereck Ligas M.D.   On: 01/04/2015 15:20   Ct Head Wo Contrast  01/18/2015   CLINICAL DATA:  New onset grand mal seizure. Dizziness and headache postictal  EXAM: CT HEAD WITHOUT CONTRAST  TECHNIQUE: Contiguous axial images were obtained from the base of the skull through the vertex without intravenous contrast.  COMPARISON:  None.  FINDINGS: There is mild diffuse atrophy. There is no intracranial mass, hemorrhage, extra-axial fluid collection, or midline shift. Gray-white compartments are normal. No acute infarct apparent. Bony calvarium appears intact. The mastoid air cells are clear.  IMPRESSION: Mild diffuse atrophy.  Study otherwise unremarkable.   Electronically Signed   By: Lowella Grip III M.D.   On: 01/18/2015 14:53   Mr Jeri Cos [facility] Contrast  01/19/2015   CLINICAL DATA:  New onset seizures. Lethargy. Right face and arm numbness. History of alcohol abuse.  EXAM: MRI HEAD WITHOUT AND WITH CONTRAST  TECHNIQUE: Multiplanar, multiecho pulse sequences of the brain and surrounding structures were obtained without and with intravenous contrast.  CONTRAST:  27mL MULTIHANCE  GADOBENATE DIMEGLUMINE 529 MG/ML IV SOLN  COMPARISON:  Head CT 01/18/2015  FINDINGS: Dedicated thin section imaging through the temporal lobes demonstrates symmetric volume and normal signal of the hippocampi. There is mild-to-moderate generalized cerebral atrophy, advanced for age. There is no evidence of acute infarct, intracranial hemorrhage, mass, midline shift, or extra-axial fluid collection. No significant cerebral white matter disease is identified. There is mild patchy T2 hyperintensity in the pons which is nonspecific but may reflect mild chronic small vessel ischemic change.  Orbits are unremarkable. Trace right mastoid effusion is noted. Paranasal sinuses are clear. Major intracranial vascular flow voids are preserved.  IMPRESSION: 1. No acute intracranial abnormality. No etiology of seizures identified. 2. Age advanced cerebral atrophy.   Electronically Signed   By: Logan Bores   On: 01/19/2015 15:00   Dg Abd Acute W/chest  01/04/2015   CLINICAL DATA:  Mid chest pain.  Symptoms for 1 week.  EXAM: DG ABDOMEN  ACUTE W/ 1V CHEST  COMPARISON:  Plain films earlier today.  FINDINGS: Scattered air-fluid levels throughout small and large bowel without definite obstruction. Increased number of visible small bowel loops is also abnormal. No free air is observed. Surgical clips surround the GE junction. Radiodense material in LEFT upper quadrant likely ingested within the stomach. Heart size and mediastinal contours are within normal limits. Both lungs are clear.  IMPRESSION: Suspected ileus. No acute cardiopulmonary disease. Stable appearance of the chest from priors.   Electronically Signed   By: Rolla Flatten M.D.   On: 01/04/2015 20:01    Micro Results     Recent Results (from the past 240 hour(s))  MRSA PCR Screening     Status: Abnormal   Collection Time: 01/18/15  8:39 PM  Result Value Ref Range Status   MRSA by PCR POSITIVE (A) NEGATIVE Final    Comment:        The GeneXpert MRSA Assay  (FDA approved for NASAL specimens only), is one component of a comprehensive MRSA colonization surveillance program. It is not intended to diagnose MRSA infection nor to guide or monitor treatment for MRSA infections. RESULT CALLED TO, READ BACK BY AND VERIFIED WITH: Janene Madeira [id number] @ 2300 BY J SCOTTON        Today   Subjective:   Samuel Reyes today has no headache,no chest abdominal pain,no new weakness tingling or numbness, feels much better  today.   Objective:   Blood pressure 98/68, pulse 100, temperature 99.2 F (37.3 C), temperature source Oral, resp. rate 16, height 5\' 4"  (1.626 m), weight 63.8 kg (140 lb 10.5 oz), SpO2 98 %.   Intake/Output Summary (Last 24 hours) at 01/22/15 0950 Last data filed at 01/22/15 1962  Gross per 24 hour  Intake    995 ml  Output   2250 ml  Net  -1255 ml    Exam  Awake Alert, Oriented , No new F.N deficits, Normal affect [place].AT,PERRAL Supple Neck,No JVD, No cervical lymphadenopathy appriciated.  Symmetrical Chest wall movement, Good air movement bilaterally, CTAB RRR,No Gallops,Rubs or new Murmurs, No Parasternal Heave +ve B.Sounds, Abd Soft, No tenderness, No organomegaly appriciated, No rebound - guarding or rigidity. No Cyanosis, Clubbing or edema, No new Rash or bruise , no tremors Data Review   CBC w Diff: Lab Results  Component Value Date   WBC 7.1 01/21/2015   HGB 11.5* 01/21/2015   HCT 35.0* 01/21/2015   PLT 223 01/21/2015   LYMPHOPCT 13 01/18/2015   MONOPCT 4 01/18/2015   EOSPCT 0 01/18/2015   BASOPCT 0 01/18/2015    CMP: Lab Results  Component Value Date   NA 130* 01/22/2015   K 4.4 01/22/2015   CL 98* 01/22/2015   CO2 27 01/22/2015   BUN 11 01/22/2015   CREATININE 0.77 01/22/2015   CREATININE 0.95 09/22/2014   PROT 5.0* 01/19/2015   ALBUMIN 2.7* 01/19/2015   BILITOT 0.6 01/19/2015   ALKPHOS 53 01/19/2015   AST 30 01/19/2015   ALT 30 01/19/2015  .   Total Time in preparing paper  work, data evaluation and todays exam - 35 minutes  Omah Dewalt M.D on 01/22/2015 at 9:50 AM  [facility]   Office  [phone]

## 2015-01-22 NOTE — Progress Notes (Signed)
Pt discharged to home. DC instructions given. Prescriptions x 5 meds also given. No concerns voiced. Left unit in wheelchair pushed by transporter. Left in good condition to meet ride downstairs. Vwilliams,rn.

## 2015-01-22 NOTE — Discharge Instructions (Signed)
Follow with Primary MD ADVANI, DEEPAK, MD in 7 days  ° °Get CBC, CMP, 2 view Chest X ray checked  by Primary MD next visit.  ° ° °Activity: As tolerated with Full fall precautions use walker/cane & assistance as needed ° ° °Disposition Home ° ° °Diet: Heart Healthy  , with feeding assistance and aspiration precautions. ° °For Heart failure patients - Check your Weight same time everyday, if you gain over 2 pounds, or you develop in leg swelling, experience more shortness of breath or chest pain, call your Primary MD immediately. Follow Cardiac Low Salt Diet and 1.5 lit/day fluid restriction. ° ° °On your next visit with your primary care physician please Get Medicines reviewed and adjusted. ° ° °Please request your Prim.MD to go over all Hospital Tests and Procedure/Radiological results at the follow up, please get all Hospital records sent to your Prim MD by signing hospital release before you go home. ° ° °If you experience worsening of your admission symptoms, develop shortness of breath, life threatening emergency, suicidal or homicidal thoughts you must seek medical attention immediately by calling 911 or calling your MD immediately  if symptoms less severe. ° °You Must read complete instructions/literature along with all the possible adverse reactions/side effects for all the Medicines you take and that have been prescribed to you. Take any new Medicines after you have completely understood and accpet all the possible adverse reactions/side effects.  ° °Do not drive, operating heavy machinery, perform activities at heights, swimming or participation in water activities or provide baby sitting services if your were admitted for syncope or siezures until you have seen by Primary MD or a Neurologist and advised to do so again. ° °Do not drive when taking Pain medications.  ° ° °Do not take more than prescribed Pain, Sleep and Anxiety Medications ° °Special Instructions: If you have smoked or chewed Tobacco  in  the last 2 yrs please stop smoking, stop any regular Alcohol  and or any Recreational drug use. ° °Wear Seat belts while driving. ° ° °Please note ° °You were cared for by a hospitalist during your hospital stay. If you have any questions about your discharge medications or the care you received while you were in the hospital after you are discharged, you can call the unit and asked to speak with the hospitalist on call if the hospitalist that took care of you is not available. Once you are discharged, your primary care physician will handle any further medical issues. Please note that NO REFILLS for any discharge medications will be authorized once you are discharged, as it is imperative that you return to your primary care physician (or establish a relationship with a primary care physician if you do not have one) for your aftercare needs so that they can reassess your need for medications and monitor your lab values. ° °

## 2015-02-05 ENCOUNTER — Emergency Department (HOSPITAL_COMMUNITY): Payer: Medicaid Other

## 2015-02-05 ENCOUNTER — Observation Stay (HOSPITAL_COMMUNITY)
Admission: EM | Admit: 2015-02-05 | Discharge: 2015-02-07 | Disposition: A | Discharge status: Home / Self Care | Payer: Medicaid Other | Attending: Internal Medicine | Admitting: Internal Medicine

## 2015-02-05 ENCOUNTER — Encounter (HOSPITAL_COMMUNITY): Payer: Self-pay

## 2015-02-05 DIAGNOSIS — E871 Hypo-osmolality and hyponatremia: Secondary | ICD-10-CM

## 2015-02-05 DIAGNOSIS — F329 Major depressive disorder, single episode, unspecified: Secondary | ICD-10-CM | POA: Insufficient documentation

## 2015-02-05 DIAGNOSIS — Z8673 Personal history of transient ischemic attack (TIA), and cerebral infarction without residual deficits: Secondary | ICD-10-CM | POA: Diagnosis not present

## 2015-02-05 DIAGNOSIS — N179 Acute kidney failure, unspecified: Secondary | ICD-10-CM | POA: Diagnosis not present

## 2015-02-05 DIAGNOSIS — E43 Unspecified severe protein-calorie malnutrition: Secondary | ICD-10-CM | POA: Insufficient documentation

## 2015-02-05 DIAGNOSIS — F1020 Alcohol dependence, uncomplicated: Secondary | ICD-10-CM | POA: Diagnosis not present

## 2015-02-05 DIAGNOSIS — E869 Volume depletion, unspecified: Secondary | ICD-10-CM | POA: Diagnosis not present

## 2015-02-05 DIAGNOSIS — K219 Gastro-esophageal reflux disease without esophagitis: Secondary | ICD-10-CM | POA: Insufficient documentation

## 2015-02-05 DIAGNOSIS — F17210 Nicotine dependence, cigarettes, uncomplicated: Secondary | ICD-10-CM | POA: Insufficient documentation

## 2015-02-05 DIAGNOSIS — Z880 Allergy status to penicillin: Secondary | ICD-10-CM | POA: Insufficient documentation

## 2015-02-05 DIAGNOSIS — I10 Essential (primary) hypertension: Secondary | ICD-10-CM | POA: Diagnosis not present

## 2015-02-05 DIAGNOSIS — E876 Hypokalemia: Secondary | ICD-10-CM | POA: Diagnosis not present

## 2015-02-05 DIAGNOSIS — J449 Chronic obstructive pulmonary disease, unspecified: Secondary | ICD-10-CM | POA: Insufficient documentation

## 2015-02-05 DIAGNOSIS — E878 Other disorders of electrolyte and fluid balance, not elsewhere classified: Secondary | ICD-10-CM | POA: Diagnosis present

## 2015-02-05 DIAGNOSIS — Z79899 Other long term (current) drug therapy: Secondary | ICD-10-CM | POA: Insufficient documentation

## 2015-02-05 DIAGNOSIS — K7460 Unspecified cirrhosis of liver: Secondary | ICD-10-CM | POA: Insufficient documentation

## 2015-02-05 DIAGNOSIS — Z91030 Bee allergy status: Secondary | ICD-10-CM | POA: Insufficient documentation

## 2015-02-05 DIAGNOSIS — R51 Headache: Secondary | ICD-10-CM | POA: Insufficient documentation

## 2015-02-05 DIAGNOSIS — R1013 Epigastric pain: Secondary | ICD-10-CM

## 2015-02-05 DIAGNOSIS — I341 Nonrheumatic mitral (valve) prolapse: Secondary | ICD-10-CM | POA: Insufficient documentation

## 2015-02-05 DIAGNOSIS — J9611 Chronic respiratory failure with hypoxia: Secondary | ICD-10-CM | POA: Diagnosis not present

## 2015-02-05 DIAGNOSIS — M1990 Unspecified osteoarthritis, unspecified site: Secondary | ICD-10-CM | POA: Insufficient documentation

## 2015-02-05 DIAGNOSIS — J45909 Unspecified asthma, uncomplicated: Secondary | ICD-10-CM | POA: Diagnosis not present

## 2015-02-05 DIAGNOSIS — F419 Anxiety disorder, unspecified: Secondary | ICD-10-CM | POA: Insufficient documentation

## 2015-02-05 DIAGNOSIS — K449 Diaphragmatic hernia without obstruction or gangrene: Secondary | ICD-10-CM | POA: Insufficient documentation

## 2015-02-05 DIAGNOSIS — R000 Tachycardia, unspecified: Secondary | ICD-10-CM | POA: Insufficient documentation

## 2015-02-05 DIAGNOSIS — K259 Gastric ulcer, unspecified as acute or chronic, without hemorrhage or perforation: Secondary | ICD-10-CM | POA: Diagnosis not present

## 2015-02-05 LAB — URINALYSIS, ROUTINE W REFLEX MICROSCOPIC
Glucose, UA: NEGATIVE mg/dL
HGB URINE DIPSTICK: NEGATIVE
Ketones, ur: >80 mg/dL — AB
LEUKOCYTES UA: NEGATIVE
Nitrite: NEGATIVE
PROTEIN: NEGATIVE mg/dL
SPECIFIC GRAVITY, URINE: 1.018 (ref 1.005–1.030)
UROBILINOGEN UA: 1 mg/dL (ref 0.0–1.0)
pH: 6 (ref 5.0–8.0)

## 2015-02-05 LAB — CBC WITH DIFFERENTIAL/PLATELET
Basophils Absolute: 0 K/uL (ref 0.0–0.1)
Basophils Relative: 0 % (ref 0–1)
Eosinophils Absolute: 0 K/uL (ref 0.0–0.7)
Eosinophils Relative: 0 % (ref 0–5)
HCT: 42.7 % (ref 39.0–52.0)
Hemoglobin: 15.5 g/dL (ref 13.0–17.0)
Lymphocytes Relative: 9 % — ABNORMAL LOW (ref 12–46)
Lymphs Abs: 1.1 K/uL (ref 0.7–4.0)
MCH: 30.5 pg (ref 26.0–34.0)
MCHC: 36.3 g/dL — ABNORMAL HIGH (ref 30.0–36.0)
MCV: 83.9 fL (ref 78.0–100.0)
Monocytes Absolute: 1.1 K/uL — ABNORMAL HIGH (ref 0.1–1.0)
Monocytes Relative: 9 % (ref 3–12)
Neutro Abs: 10.5 K/uL — ABNORMAL HIGH (ref 1.7–7.7)
Neutrophils Relative %: 82 % — ABNORMAL HIGH (ref 43–77)
Platelets: 318 K/uL (ref 150–400)
RBC: 5.09 MIL/uL (ref 4.22–5.81)
RDW: 16.6 % — ABNORMAL HIGH (ref 11.5–15.5)
WBC: 12.8 K/uL — ABNORMAL HIGH (ref 4.0–10.5)

## 2015-02-05 LAB — COMPREHENSIVE METABOLIC PANEL WITH GFR
ALT: 70 U/L — ABNORMAL HIGH (ref 17–63)
AST: 40 U/L (ref 15–41)
Albumin: 3.5 g/dL (ref 3.5–5.0)
Alkaline Phosphatase: 89 U/L (ref 38–126)
BUN: 11 mg/dL (ref 6–20)
CO2: 35 mmol/L — ABNORMAL HIGH (ref 22–32)
Calcium: 9 mg/dL (ref 8.9–10.3)
Chloride: <65 mmol/L — CL (ref 101–111)
Creatinine, Ser: 1.23 mg/dL (ref 0.61–1.24)
GFR calc Af Amer: >60 mL/min
GFR calc non Af Amer: >60 mL/min
Glucose, Bld: 102 mg/dL — ABNORMAL HIGH (ref 65–99)
Potassium: 2.2 mmol/L — CL (ref 3.5–5.1)
Sodium: 121 mmol/L — ABNORMAL LOW (ref 135–145)
Total Bilirubin: 1.8 mg/dL — ABNORMAL HIGH (ref 0.3–1.2)
Total Protein: 6.4 g/dL — ABNORMAL LOW (ref 6.5–8.1)

## 2015-02-05 LAB — BASIC METABOLIC PANEL
Anion gap: 11 (ref 5–15)
BUN: 10 mg/dL (ref 6–20)
CO2: 40 mmol/L — AB (ref 22–32)
Calcium: 8.2 mg/dL — ABNORMAL LOW (ref 8.9–10.3)
Chloride: 69 mmol/L — ABNORMAL LOW (ref 101–111)
Creatinine, Ser: 1.03 mg/dL (ref 0.61–1.24)
GFR calc Af Amer: >60 mL/min (ref >60)
GFR calc non Af Amer: >60 mL/min (ref >60)
Glucose, Bld: 155 mg/dL — ABNORMAL HIGH (ref 65–99)
POTASSIUM: 2 mmol/L — AB (ref 3.5–5.1)
SODIUM: 120 mmol/L — AB (ref 135–145)

## 2015-02-05 LAB — TROPONIN I
TROPONIN I: 0.03 ng/mL (ref <0.031)
Troponin I: 0.04 ng/mL — ABNORMAL HIGH
Troponin I: 0.03 ng/mL (ref <0.031)

## 2015-02-05 LAB — LIPASE, BLOOD: Lipase: 18 U/L — ABNORMAL LOW (ref 22–51)

## 2015-02-05 LAB — MAGNESIUM: MAGNESIUM: 2.1 mg/dL (ref 1.7–2.4)

## 2015-02-05 LAB — ETHANOL: Alcohol, Ethyl (B): <5 mg/dL

## 2015-02-05 MED ORDER — TRAZODONE HCL 150 MG PO TABS
300.0000 mg | ORAL_TABLET | Freq: Every day | ORAL | Status: DC
  Administered 2015-02-05 – 2015-02-06 (×2): 300 mg via ORAL
  Filled 2015-02-05: qty 2
  Filled 2015-02-05: qty 6
  Filled 2015-02-05 (×2): qty 2

## 2015-02-05 MED ORDER — IOHEXOL 300 MG/ML  SOLN
100.0000 mL | Freq: Once | INTRAMUSCULAR | Status: AC | PRN
  Administered 2015-02-05: 100 mL via INTRAVENOUS

## 2015-02-05 MED ORDER — FOLIC ACID 1 MG PO TABS
1.0000 mg | ORAL_TABLET | Freq: Every day | ORAL | Status: DC
  Administered 2015-02-05 – 2015-02-07 (×3): 1 mg via ORAL
  Filled 2015-02-05 (×3): qty 1

## 2015-02-05 MED ORDER — ONDANSETRON HCL 4 MG PO TABS: 4.0000 mg | ORAL_TABLET | Freq: Four times a day (QID) | ORAL | Status: DC | PRN

## 2015-02-05 MED ORDER — GI COCKTAIL ~~LOC~~
30.0000 mL | Freq: Three times a day (TID) | ORAL | Status: DC | PRN
  Administered 2015-02-05 – 2015-02-07 (×5): 30 mL via ORAL
  Filled 2015-02-05 (×8): qty 30

## 2015-02-05 MED ORDER — SODIUM CHLORIDE 0.9 % IV SOLN
INTRAVENOUS | Status: AC
  Administered 2015-02-05: 10:00:00 via INTRAVENOUS

## 2015-02-05 MED ORDER — LORAZEPAM 1 MG PO TABS
1.0000 mg | ORAL_TABLET | Freq: Four times a day (QID) | ORAL | Status: DC | PRN
Start: 2015-02-05 — End: 2015-02-07

## 2015-02-05 MED ORDER — LORAZEPAM 2 MG/ML IJ SOLN: 0.0000 mg | Freq: Four times a day (QID) | INTRAMUSCULAR | Status: DC

## 2015-02-05 MED ORDER — POTASSIUM CHLORIDE 10 MEQ/100ML IV SOLN
10.0000 meq | Freq: Once | INTRAVENOUS | Status: AC
  Administered 2015-02-05: 10 meq via INTRAVENOUS
  Filled 2015-02-05: qty 100

## 2015-02-05 MED ORDER — LORAZEPAM 2 MG/ML IJ SOLN: 1.0000 mg | Freq: Four times a day (QID) | INTRAMUSCULAR | Status: DC | PRN

## 2015-02-05 MED ORDER — ALBUTEROL SULFATE HFA 108 (90 BASE) MCG/ACT IN AERS: 2.0000 | INHALATION_SPRAY | Freq: Four times a day (QID) | RESPIRATORY_TRACT | Status: DC | PRN

## 2015-02-05 MED ORDER — LORAZEPAM 2 MG/ML IJ SOLN: 0.0000 mg | Freq: Two times a day (BID) | INTRAMUSCULAR | Status: DC

## 2015-02-05 MED ORDER — IOHEXOL 300 MG/ML  SOLN
25.0000 mL | Freq: Once | INTRAMUSCULAR | Status: AC | PRN
  Administered 2015-02-05: 25 mL via ORAL

## 2015-02-05 MED ORDER — PANTOPRAZOLE SODIUM 40 MG PO TBEC
40.0000 mg | DELAYED_RELEASE_TABLET | Freq: Two times a day (BID) | ORAL | Status: DC
  Administered 2015-02-05 – 2015-02-07 (×4): 40 mg via ORAL
  Filled 2015-02-05 (×4): qty 1

## 2015-02-05 MED ORDER — POTASSIUM CHLORIDE CRYS ER 20 MEQ PO TBCR
40.0000 meq | EXTENDED_RELEASE_TABLET | Freq: Three times a day (TID) | ORAL | Status: AC
  Administered 2015-02-05 – 2015-02-06 (×3): 40 meq via ORAL
  Filled 2015-02-05 (×4): qty 2

## 2015-02-05 MED ORDER — ADULT MULTIVITAMIN W/MINERALS CH
1.0000 | ORAL_TABLET | Freq: Every day | ORAL | Status: DC
  Administered 2015-02-05 – 2015-02-07 (×3): 1 via ORAL
  Filled 2015-02-05 (×4): qty 1

## 2015-02-05 MED ORDER — VITAMIN B-1 100 MG PO TABS
100.0000 mg | ORAL_TABLET | Freq: Every day | ORAL | Status: DC
  Administered 2015-02-06 – 2015-02-07 (×2): 100 mg via ORAL
  Filled 2015-02-05 (×3): qty 1

## 2015-02-05 MED ORDER — ALBUTEROL SULFATE (2.5 MG/3ML) 0.083% IN NEBU: 2.5000 mg | INHALATION_SOLUTION | Freq: Four times a day (QID) | RESPIRATORY_TRACT | Status: DC | PRN

## 2015-02-05 MED ORDER — MORPHINE SULFATE 4 MG/ML IJ SOLN
4.0000 mg | Freq: Once | INTRAMUSCULAR | Status: AC
  Administered 2015-02-05: 4 mg via INTRAVENOUS
  Filled 2015-02-05: qty 1

## 2015-02-05 MED ORDER — ONDANSETRON HCL 4 MG/2ML IJ SOLN: 4.0000 mg | Freq: Four times a day (QID) | INTRAMUSCULAR | Status: DC | PRN

## 2015-02-05 MED ORDER — SODIUM CHLORIDE 0.9 % IV SOLN
INTRAVENOUS | Status: DC
  Administered 2015-02-05: 22:00:00 via INTRAVENOUS
  Administered 2015-02-06: 1000 mL via INTRAVENOUS
  Administered 2015-02-06: 21:00:00 via INTRAVENOUS

## 2015-02-05 MED ORDER — LORAZEPAM 1 MG PO TABS: 0.0000 mg | ORAL_TABLET | Freq: Two times a day (BID) | ORAL | Status: DC

## 2015-02-05 MED ORDER — NICOTINE 21 MG/24HR TD PT24
21.0000 mg | MEDICATED_PATCH | Freq: Every day | TRANSDERMAL | Status: DC
  Administered 2015-02-05 – 2015-02-07 (×3): 21 mg via TRANSDERMAL
  Filled 2015-02-05 (×3): qty 1

## 2015-02-05 MED ORDER — LORAZEPAM 1 MG PO TABS
0.0000 mg | ORAL_TABLET | Freq: Four times a day (QID) | ORAL | Status: DC
  Administered 2015-02-05: 1 mg via ORAL
  Filled 2015-02-05: qty 1

## 2015-02-05 MED ORDER — POTASSIUM CHLORIDE CRYS ER 20 MEQ PO TBCR
40.0000 meq | EXTENDED_RELEASE_TABLET | Freq: Once | ORAL | Status: AC
  Administered 2015-02-05: 40 meq via ORAL
  Filled 2015-02-05: qty 2

## 2015-02-05 MED ORDER — ENSURE ENLIVE PO LIQD
237.0000 mL | Freq: Two times a day (BID) | ORAL | Status: DC
  Administered 2015-02-06 (×2): 237 mL via ORAL

## 2015-02-05 MED ORDER — ONDANSETRON HCL 4 MG/2ML IJ SOLN
4.0000 mg | Freq: Once | INTRAMUSCULAR | Status: AC
  Administered 2015-02-05: 4 mg via INTRAVENOUS
  Filled 2015-02-05: qty 2

## 2015-02-05 MED ORDER — SODIUM CHLORIDE 0.9 % IV BOLUS (SEPSIS): 1000.0000 mL | Freq: Once | INTRAVENOUS | Status: DC

## 2015-02-05 MED ORDER — THIAMINE HCL 100 MG/ML IJ SOLN
100.0000 mg | Freq: Every day | INTRAMUSCULAR | Status: DC
  Administered 2015-02-05: 100 mg via INTRAVENOUS
  Filled 2015-02-05 (×3): qty 1

## 2015-02-05 NOTE — Progress Notes (Signed)
Pt transferred to [address] bed 18 per w/c with all belongings. Accompanied by nurse tech

## 2015-02-05 NOTE — ED Notes (Signed)
Pt reports last drink at 0000 today.

## 2015-02-05 NOTE — ED Notes (Signed)
NP aware of critical labs.

## 2015-02-05 NOTE — H&P (Addendum)
History and Physical  Samuel Reyes JXB:[id number] DOB: 08/10/1955 DOA: 02/05/2015  Referring physician: Glendell Docker, ER PA PCP: Lorayne Marek, MD   Chief Complaint: Abdominal pain  HPI: Samuel Reyes is a 59 y.o. male  Past medical history of alcohol abuse, COPD and mild cirrhosis who was recently discharged from the hospitalist service after coming in following alcohol withdrawal seizure and treated for hyponatremia. He returned back today complaining of midepigastric abdominal pain Patient states he last drank the previous day. Alcohol level normal on admission. Labs were noted for sodium of 121, potassium 2.2 and white count of 12.8. Troponin level at 0.04. CT scan of abdomen pelvis noted signs of chronic liver issues secondary to alcohol, but no signs of any ascites or bowel obstruction. Patient given doses of oral replacement potassium  eview of Systems:  Patient seen After arrival to floor . Pt complains of Mild abdominal pain in the midepigastric area, although much improved   Pt denies any Headaches, vision changes, dysphagia, chest pain, palpitations, shortness of breath, wheeze, cough, hematuria, dysuria, constipation, diarrhea, focal extremity numbness weakness or paineview of systems are otherwise negative  Past Medical History  Diagnosis Date  . Cirrhosis   . Degenerative joint disease   . Hypertension   . Gastric ulcer   . Depression   . Asthma   . Mitral valve prolapse 2002  . H/O hiatal hernia   . COPD (chronic obstructive pulmonary disease)   . TIA (transient ischemic attack)     2010  . Stroke     TIA - 2010 - no deficits   . Shortness of breath     with exertion   . GERD (gastroesophageal reflux disease)   . Headache(784.0)   . Hiatal hernia 1982  . Degenerative joint disease   . Anxiety   . Alcohol abuse    Past Surgical History  Procedure Laterality Date  . Gastrectomy    . Shoulder surgery Bilateral     3 surgeries on on left, 2 surgeries  on right   . Rt knee arthroscopic surgery    . Back surgery      3 cervical spine surgeries C4-C5 fused  . Hernia repair    . Finger surgery Left     2nd, 3rd, & 4th fingers were cut off by table saw and reattached  . Colonoscopy N/A 01/04/2014    Procedure: COLONOSCOPY;  Surgeon: Danie Binder, MD;  Location: [facility];  Service: Endoscopy;  Laterality: N/A;  1:45  . Esophagogastroduodenoscopy N/A 01/04/2014    Procedure: ESOPHAGOGASTRODUODENOSCOPY (EGD);  Surgeon: Danie Binder, MD;  Location: [facility];  Service: Endoscopy;  Laterality: N/A;  . Incisional hernia repair N/A 01/20/2014    Procedure: LAPAROSCOPIC RECURRENT  INCISIONAL HERNIA with mesh;  Surgeon: Edward Jolly, MD;  Location: [facility];  Service: General;  Laterality: N/A;   Social History:  reports that he has been smoking Cigarettes.  He started smoking about 48 years ago. He has a 45 pack-year smoking history. He has never used smokeless tobacco. He reports that he drinks about 3.6 oz of alcohol per week. He reports that he does not use illicit drugs. Patient lives at  home with 2 roommates & is able to participate in activities of daily living with out assistance  Allergies  Allergen Reactions  . Bee Venom Anaphylaxis  . Penicillins Rash    Family History  Problem Relation Age of Onset  . Cancer Father  bone  . Cancer Brother     lungs  . Stroke Maternal Grandmother   . Colon cancer Neg Hx   . Asthma Son     died at age 57 in his sleep   . Spina bifida Son     died at age 30       Prior to Admission medications   Medication Sig Start Date End Date Taking? Authorizing Provider  acamprosate (CAMPRAL) 333 MG tablet Take 2 tablets (666 mg total) by mouth 3 (three) times daily with meals. For alcoholism 01/06/15  Yes Charlynne Cousins, MD  albuterol (PROVENTIL HFA;VENTOLIN HFA) 108 (90 BASE) MCG/ACT inhaler Inhale 2 puffs into the lungs every 6 (six) hours as needed for wheezing or shortness of  breath. 01/06/15  Yes Charlynne Cousins, MD  albuterol-ipratropium (COMBIVENT) 18-103 MCG/ACT inhaler Inhale 2 puffs into the lungs 2 (two) times daily. For shortness of breath 09/22/14  Yes Deepak Advani, MD  feeding supplement, ENSURE ENLIVE, (ENSURE ENLIVE) LIQD Take 237 mLs by mouth 2 (two) times daily between meals. 01/22/15  Yes Albertine Patricia, MD  folic acid (FOLVITE) 1 MG tablet Take 1 tablet (1 mg total) by mouth daily. 01/22/15  Yes Albertine Patricia, MD  gabapentin (NEURONTIN) 300 MG capsule Take 1 capsule (300 mg total) by mouth 2 (two) times daily. For substance withdrawal syndrome 01/06/15  Yes Charlynne Cousins, MD  Multiple Vitamin (MULTIVITAMIN WITH MINERALS) TABS tablet Take 1 tablet by mouth daily. 01/22/15  Yes Silver Huguenin Elgergawy, MD  pantoprazole (PROTONIX) 40 MG tablet Take 1 tablet (40 mg total) by mouth 2 (two) times daily. 01/06/15  Yes Charlynne Cousins, MD  trazodone (DESYREL) 150 MG tablet Take 2 tablets (300 mg total) by mouth at bedtime. For sleep 08/08/14  Yes Shuvon B Rankin, NP  nicotine (NICODERM CQ - DOSED IN MG/24 HOURS) 21 mg/24hr patch PLACE 1 PATCH ONTO THE SKIN DAILY Patient not taking: Reported on 02/05/2015 11/05/14   Lorayne Marek, MD    Physical Exam: BP 129/87 mmHg  Pulse 106  Temp(Src) 98 F (36.7 C) (Oral)  Resp 21  Ht 5\' 4"  (1.626 m)  Wt 60.011 kg (132 lb 4.8 oz)  BMI 22.70 kg/m2  SpO2 92%  General:  Alert and oriented 3, no acute distress Eyes: Sclera nonicteric, extraocular movements are intact ENT: normocephalic May 2, mucous membranes are dry Neck: no JVD  Cardiovascular: regular rhythm, borderline tachycardia  Respiratory: decreased breath sounds throughout  Abdomen: soft, minimal tenderness in the midepigastric area, nondistended, hypoactive bowel sounds  Skin: chronically tanned skin, otherwise no skin breaks tears or lesions  Musculoskeletal: no clubbing or cyanosis or edema  Psychiatric: patient is appropriate, no evidence of  psychoses  Neurologic: no focal deficits           Labs on Admission:  Basic Metabolic Panel:  Recent Labs Lab 02/05/15 0759  NA 121*  K 2.2*  CL <65*  CO2 35*  GLUCOSE 102*  BUN 11  CREATININE 1.23  CALCIUM 9.0   Liver Function Tests:  Recent Labs Lab 02/05/15 0759  AST 40  ALT 70*  ALKPHOS 89  BILITOT 1.8*  PROT 6.4*  ALBUMIN 3.5    Recent Labs Lab 02/05/15 0759  LIPASE 18*   No results for input(s): AMMONIA in the last 168 hours. CBC:  Recent Labs Lab 02/05/15 0759  WBC 12.8*  NEUTROABS 10.5*  HGB 15.5  HCT 42.7  MCV 83.9  PLT 318  Cardiac Enzymes:  Recent Labs Lab 02/05/15 0759  TROPONINI 0.04*    BNP (last 3 results)  Recent Labs  12/10/14 1339 01/04/15 1432  BNP 46.5 20.9    ProBNP (last 3 results)  Recent Labs  04/14/14 1911  PROBNP 49.4    CBG: No results for input(s): GLUCAP in the last 168 hours.  Radiological Exams on Admission: Ct Abdomen Pelvis W Contrast  02/05/2015   CLINICAL DATA:  Epigastric pain, nausea/vomiting x3 weeks, history of cirrhosis  EXAM: CT ABDOMEN AND PELVIS WITH CONTRAST  TECHNIQUE: Multidetector CT imaging of the abdomen and pelvis was performed using the standard protocol following bolus administration of intravenous contrast.  CONTRAST:  131mL OMNIPAQUE IOHEXOL 300 MG/ML  SOLN  COMPARISON:  None.  FINDINGS: Lower chest:  Minimal dependent atelectasis at the right lung base.  Hepatobiliary: Liver is notable for hepatic steatosis with mild focal fatty sparing along the gallbladder fossa. Focal fat/altered perfusion along the falciform ligament. Mildly macronodular lobular contour of the left liver (series 21/image 21).  Gallbladder is unremarkable. No intrahepatic or extrahepatic ductal dilatation.  Pancreas: Within normal limits.  Spleen: Within normal limits.  Adrenals/Urinary Tract: Adrenal glands are within normal limits.  Kidneys are within normal limits.  No hydronephrosis.  Bladder is within  normal limits.  Stomach/Bowel: Stomach is notable for postsurgical changes at the GE junction.  No evidence of bowel obstruction.  Appendix is not discretely visualized.  Vascular/Lymphatic: Atherosclerotic calcifications of the abdominal aorta and branch vessels.  Small perigastric varices.  Portal vein is patent.  No suspicious abdominopelvic lymphadenopathy.  Reproductive: Prostate is grossly unremarkable.  Other: No abdominopelvic ascites.  Postsurgical changes from prior right inguinal hernia repair.  Musculoskeletal: Degenerative changes of the lumbar spine.  IMPRESSION: No evidence of bowel obstruction.  Prior surgical changes at the GE junction.  Suspected mild changes of cirrhosis. No focal hepatic lesion is seen. Superimposed hepatic steatosis with focal fatty sparing.  No CT findings to account for the patient's abdominal pain.   Electronically Signed   By: Julian Hy M.D.   On: 02/05/2015 10:48    EKG: Independently reviewed. prolonged QT  Assessment/Plan Present on Admission:  . Hyponatremia: Patient has chronic hyponatremia, likely from dehydration and possibly potomania.    . Smoking: Nicotine patch  . Protein-calorie malnutrition, severe: Start ensure supplements  . Major depressive disorder, recurrent, severe without psychotic features . Hypokalemia: Replacing, checking magnesium level.  . Essential hypertension: Stable.    . Alcohol dependence: Patient counseled to quit drinking. Alcohol level normal on admission. We'll monitor closely for alcohol withdrawal.  . Hepatic steatosis: Stable. Noted early signs of cirrhosis.  . Abdominal pain : Principal problem. Suspect secondary to alcoholic gastritis. Lipase level normal and patient able to tolerate some by mouth so do not think that this is chronic pancreatitis. GI cocktail and advance diet.  Elevated troponin: Mild. May be secondary to respiratory failure issues. Cycle troponins x 3  Prolonged QT: Follow-up EKG in the  morning. Chronic respiratory failure with hypoxia from underlying COPD: Patient not on oxygen at home. That said, he appears comfortable and denies any acute shortness of breath. However, by monitoring, clearly has low oxygen saturations. Given his chronic smoking, suspect that he has chronic respiratory failure from underlying COPD. Treating with supplemental oxygen. Artery discharge, will ambulate and see if he needs to be on oxygen long-term.  Consultants: None  Code Status: Full code as confirmed by patient  Family Communication: No family present  Disposition Plan: Likely home tomorrow if labs are okay  Time spent: 45 minutes  [facility] Pager [phone]

## 2015-02-05 NOTE — Progress Notes (Signed)
Called report to [facility]  [facility] for bed 18 Pt will transfer per w/c

## 2015-02-05 NOTE — ED Notes (Signed)
Per EMS- pt reports burning in throat x3 days with n/v. Hx hiatal hernia. Seen 3 weeks ago for same symptoms and dx with epigastric pain. Pt drinks 12-pack beer/day and smokes 1 pack/day. Pt has had no alcohol or food today. EMS gave 4mg  zofran and helped nausea decrease. Took 3 puffs albuterol at home (pt states he does this every day). EMS gave 324mg  aspirin and 1 nitro with no relief. Sinus tachycardia on monitor (112-116). No hx MI. BP 122/78. Afebrile. 20G RAC

## 2015-02-05 NOTE — Progress Notes (Signed)
Pt transferred to [facility] per w/c with belongings. MD was text paged at 1740 about Potassium of 2,0 Pt had just received 40 meq K at 1630 & has another dose scheduled and received at 1830.

## 2015-02-05 NOTE — ED Provider Notes (Signed)
Medical screening examination/treatment/procedure(s) were conducted as a shared visit with non-physician practitioner(s) and myself.  I personally evaluated the patient during the encounter.   EKG Interpretation   Date/Time:  Saturday February 05 2015 08:04:09 EDT Ventricular Rate:  108 PR Interval:  125 QRS Duration: 95 QT Interval:  430 QTC Calculation: 576 R Axis:   64 Text Interpretation:  Sinus tachycardia Prolonged QT interval Confirmed by  [facility]  MD, TREY ([id number]) on 02/05/2015 8:13:01 AM      59 yo male with hx of alcohol abuse presenting epigastric pain.  On exam, well appearing, nontoxic, not distressed, normal respiratory effort, normal perfusion, epigastric TTP without r/r/g, dry mucous membranes.  His epigastric pain is likely alcoholic gastritis.  However, his labs were concerning for severe hyponatremia.  Started on slow NS infusion.  Have also started potassium replacement.  Plan admit.  Of note, he drinks more than 12 beers a day.    Clinical Impression: 1. Hyponatremia   2. Hypokalemia   3. Hypochloremia   4. Epigastric pain       Serita Grit, MD 02/05/15 1116

## 2015-02-05 NOTE — ED Provider Notes (Signed)
CSN: [id number]     Arrival date & time 02/05/15  5277 History   First MD Initiated Contact with Patient 02/05/15 [phone]     Chief Complaint  Patient presents with  . Abdominal Pain     (Consider location/radiation/quality/duration/timing/severity/associated sxs/prior Treatment) Patient is a 59 y.o. male presenting with abdominal pain. The history is provided by the patient. No language interpreter was used.  Abdominal Pain Pain location:  Epigastric Pain quality: aching   Pain radiates to:  Does not radiate Pain severity:  Moderate Onset quality:  Sudden Duration:  3 days Timing:  Constant Progression:  Unchanged Context: alcohol use   Relieved by:  Nothing Worsened by:  Nothing tried Ineffective treatments:  None tried Associated symptoms: nausea   Associated symptoms: no chest pain, no fever, no hematemesis and no hematuria     Past Medical History  Diagnosis Date  . Cirrhosis   . Degenerative joint disease   . Hypertension   . Gastric ulcer   . Depression   . Asthma   . Mitral valve prolapse 2002  . H/O hiatal hernia   . COPD (chronic obstructive pulmonary disease)   . TIA (transient ischemic attack)     2010  . Stroke     TIA - 2010 - no deficits   . Shortness of breath     with exertion   . GERD (gastroesophageal reflux disease)   . Headache(784.0)   . Hiatal hernia 1982  . Degenerative joint disease   . Anxiety   . Alcohol abuse    Past Surgical History  Procedure Laterality Date  . Gastrectomy    . Shoulder surgery Bilateral     3 surgeries on on left, 2 surgeries on right   . Rt knee arthroscopic surgery    . Back surgery      3 cervical spine surgeries C4-C5 fused  . Hernia repair    . Finger surgery Left     2nd, 3rd, & 4th fingers were cut off by table saw and reattached  . Colonoscopy N/A 01/04/2014    Procedure: COLONOSCOPY;  Surgeon: Danie Binder, MD;  Location: [facility];  Service: Endoscopy;  Laterality: N/A;  1:45  .  Esophagogastroduodenoscopy N/A 01/04/2014    Procedure: ESOPHAGOGASTRODUODENOSCOPY (EGD);  Surgeon: Danie Binder, MD;  Location: [facility];  Service: Endoscopy;  Laterality: N/A;  . Incisional hernia repair N/A 01/20/2014    Procedure: LAPAROSCOPIC RECURRENT  INCISIONAL HERNIA with mesh;  Surgeon: Edward Jolly, MD;  Location: [facility];  Service: General;  Laterality: N/A;   Family History  Problem Relation Age of Onset  . Cancer Father     bone  . Cancer Brother     lungs  . Stroke Maternal Grandmother   . Colon cancer Neg Hx   . Asthma Son     died at age 39 in his sleep   . Spina bifida Son     died at age 38    History  Substance Use Topics  . Smoking status: Current Every Day Smoker -- 1.00 packs/day for 45 years    Types: Cigarettes    Start date: 07/30/1966  . Smokeless tobacco: Never Used  . Alcohol Use: 3.6 oz/week    6 Cans of beer per week     Comment: 1 case of beer and 1 shot of vodka between 1030am-1430pm    Review of Systems  Constitutional: Negative for fever.  Cardiovascular: Negative for chest pain.  Gastrointestinal: Positive for nausea and abdominal pain. Negative for hematemesis.  Genitourinary: Negative for hematuria.  All other systems reviewed and are negative.     Allergies  Bee venom and Penicillins  Home Medications   Prior to Admission medications   Medication Sig Start Date End Date Taking? Authorizing Provider  acamprosate (CAMPRAL) 333 MG tablet Take 2 tablets (666 mg total) by mouth 3 (three) times daily with meals. For alcoholism Patient not taking: Reported on 01/18/2015 01/06/15   Charlynne Cousins, MD  albuterol (PROVENTIL HFA;VENTOLIN HFA) 108 (90 BASE) MCG/ACT inhaler Inhale 2 puffs into the lungs every 6 (six) hours as needed for wheezing or shortness of breath. Patient not taking: Reported on 01/18/2015 01/06/15   Charlynne Cousins, MD  albuterol-ipratropium [facility]) [phone] MCG/ACT inhaler Inhale 2 puffs into the lungs 2  (two) times daily. For shortness of breath 09/22/14   Lorayne Marek, MD  chlordiazePOXIDE (LIBRIUM) 10 MG capsule Please take 2 tablets (20 mg) oral 3 times daily for 3 days, then take 1 tablet (10 mg) oral 3 times daily for 3 days, then 1 tablet (10 mg ) oral once daily for 3 days then stop. 01/22/15   Silver Huguenin Elgergawy, MD  feeding supplement, ENSURE ENLIVE, (ENSURE ENLIVE) LIQD Take 237 mLs by mouth 2 (two) times daily between meals. 01/22/15   Silver Huguenin Elgergawy, MD  FLUoxetine (PROZAC) 40 MG capsule Take 2 capsules (80 mg total) by mouth daily. For depression Patient not taking: Reported on 01/18/2015 01/06/15   Charlynne Cousins, MD  folic acid (FOLVITE) 1 MG tablet Take 1 tablet (1 mg total) by mouth daily. 01/22/15   Silver Huguenin Elgergawy, MD  gabapentin (NEURONTIN) 300 MG capsule Take 1 capsule (300 mg total) by mouth 2 (two) times daily. For substance withdrawal syndrome Patient not taking: Reported on 01/18/2015 01/06/15   Charlynne Cousins, MD  Multiple Vitamin (MULTIVITAMIN WITH MINERALS) TABS tablet Take 1 tablet by mouth daily. 01/22/15   Silver Huguenin Elgergawy, MD  nicotine (NICODERM CQ - DOSED IN MG/24 HOURS) 21 mg/24hr patch PLACE 1 PATCH ONTO THE SKIN DAILY 11/05/14   Lorayne Marek, MD  pantoprazole (PROTONIX) 40 MG tablet Take 1 tablet (40 mg total) by mouth 2 (two) times daily. Patient not taking: Reported on 01/18/2015 01/06/15   Charlynne Cousins, MD  thiamine 100 MG tablet Take 1 tablet (100 mg total) by mouth daily. 01/22/15   Albertine Patricia, MD  trazodone (DESYREL) 150 MG tablet Take 2 tablets (300 mg total) by mouth at bedtime. For sleep 08/08/14   Shuvon B Rankin, NP   BP 121/83 mmHg  Pulse 112  Temp(Src) 98.5 F (36.9 C) (Oral)  Resp 15  SpO2 95% Physical Exam  Constitutional: He is oriented to person, place, and time. He appears well-developed and well-nourished.  Cardiovascular: Normal rate and regular rhythm.   Pulmonary/Chest: Effort normal and breath sounds normal.   Abdominal: Soft. Bowel sounds are normal. There is tenderness in the epigastric area.  Musculoskeletal: Normal range of motion.  Neurological: He is alert and oriented to person, place, and time. Coordination normal.  Skin: Skin is warm and dry.  Psychiatric: He has a normal mood and affect.  Nursing note and vitals reviewed.   ED Course  Procedures (including critical care time) Labs Review Labs Reviewed  COMPREHENSIVE METABOLIC PANEL - Abnormal; Notable for the following:    Sodium 121 (*)    Potassium 2.2 (*)    Chloride <65 (*)  CO2 35 (*)    Glucose, Bld 102 (*)    Total Protein 6.4 (*)    ALT 70 (*)    Total Bilirubin 1.8 (*)    All other components within normal limits  LIPASE, BLOOD - Abnormal; Notable for the following:    Lipase 18 (*)    All other components within normal limits  CBC WITH DIFFERENTIAL/PLATELET - Abnormal; Notable for the following:    WBC 12.8 (*)    MCHC 36.3 (*)    RDW 16.6 (*)    Neutrophils Relative % 82 (*)    Neutro Abs 10.5 (*)    Lymphocytes Relative 9 (*)    Monocytes Absolute 1.1 (*)    All other components within normal limits  URINALYSIS, ROUTINE W REFLEX MICROSCOPIC (NOT AT [facility]) - Abnormal; Notable for the following:    Color, Urine AMBER (*)    Bilirubin Urine MODERATE (*)    Ketones, ur >80 (*)    All other components within normal limits  TROPONIN I - Abnormal; Notable for the following:    Troponin I 0.04 (*)    All other components within normal limits  ETHANOL    Imaging Review Ct Abdomen Pelvis W Contrast  02/05/2015   CLINICAL DATA:  Epigastric pain, nausea/vomiting x3 weeks, history of cirrhosis  EXAM: CT ABDOMEN AND PELVIS WITH CONTRAST  TECHNIQUE: Multidetector CT imaging of the abdomen and pelvis was performed using the standard protocol following bolus administration of intravenous contrast.  CONTRAST:  138mL OMNIPAQUE IOHEXOL 300 MG/ML  SOLN  COMPARISON:  None.  FINDINGS: Lower chest:  Minimal dependent  atelectasis at the right lung base.  Hepatobiliary: Liver is notable for hepatic steatosis with mild focal fatty sparing along the gallbladder fossa. Focal fat/altered perfusion along the falciform ligament. Mildly macronodular lobular contour of the left liver (series 21/image 21).  Gallbladder is unremarkable. No intrahepatic or extrahepatic ductal dilatation.  Pancreas: Within normal limits.  Spleen: Within normal limits.  Adrenals/Urinary Tract: Adrenal glands are within normal limits.  Kidneys are within normal limits.  No hydronephrosis.  Bladder is within normal limits.  Stomach/Bowel: Stomach is notable for postsurgical changes at the GE junction.  No evidence of bowel obstruction.  Appendix is not discretely visualized.  Vascular/Lymphatic: Atherosclerotic calcifications of the abdominal aorta and branch vessels.  Small perigastric varices.  Portal vein is patent.  No suspicious abdominopelvic lymphadenopathy.  Reproductive: Prostate is grossly unremarkable.  Other: No abdominopelvic ascites.  Postsurgical changes from prior right inguinal hernia repair.  Musculoskeletal: Degenerative changes of the lumbar spine.  IMPRESSION: No evidence of bowel obstruction.  Prior surgical changes at the GE junction.  Suspected mild changes of cirrhosis. No focal hepatic lesion is seen. Superimposed hepatic steatosis with focal fatty sparing.  No CT findings to account for the patient's abdominal pain.   Electronically Signed   By: Julian Hy M.D.   On: 02/05/2015 10:48     EKG Interpretation   Date/Time:  Saturday February 05 2015 08:04:09 EDT Ventricular Rate:  108 PR Interval:  125 QRS Duration: 95 QT Interval:  430 QTC Calculation: 576 R Axis:   64 Text Interpretation:  Sinus tachycardia Prolonged QT interval Confirmed by  [facility]  MD, TREY ([id number]) on 02/05/2015 8:13:01 AM      MDM   Final diagnoses:  Hyponatremia  Hypokalemia  Hypochloremia  Epigastric pain    Pt to be admitted to the  hospital for abnormal labs. Ct without acute finding. Pt not having  any seizure activity at this time.    Glendell Docker, NP 02/05/15 [place], MD 02/05/15 1123

## 2015-02-05 NOTE — ED Notes (Signed)
CT aware pt finished contrast 

## 2015-02-05 NOTE — Progress Notes (Signed)
Samuel Reyes is a 59 y.o. male patient who transferred  from [facility] awake, alert  & orientated  X 3, Full Code, VSS - Blood pressure 118/83, pulse 103, temperature 98.2 F (36.8 C), temperature source Oral, resp. rate 14, height 5\' 4"  (1.626 m), weight 60.011 kg (132 lb 4.8 oz), SpO2 94 %., R/A, no c/o shortness of breath, no c/o chest pain, no distress noted. Tele # 25 placed and pt is currently running:normal sinus rhythm.   IV site WDL: antecubital right, condition patent and no redness with a transparent dsg that's clean dry and intact.  Allergies:   Allergies  Allergen Reactions  . Bee Venom Anaphylaxis  . Penicillins Rash     Past Medical History  Diagnosis Date  . Cirrhosis   . Degenerative joint disease   . Hypertension   . Gastric ulcer   . Depression   . Asthma   . Mitral valve prolapse 2002  . H/O hiatal hernia   . COPD (chronic obstructive pulmonary disease)   . TIA (transient ischemic attack)     2010  . Stroke     TIA - 2010 - no deficits   . Shortness of breath     with exertion   . GERD (gastroesophageal reflux disease)   . Headache(784.0)   . Hiatal hernia 1982  . Degenerative joint disease   . Anxiety   . Alcohol abuse     Pt orientation to unit, room and routine. SR up x 2, fall risk assessment complete with Patient and family verbalizing understanding of risks associated with falls. Pt verbalizes an understanding of how to use the call bell and to call for help before getting out of bed.  Skin, clean-dry- intact without evidence of bruising, or skin tears.   No evidence of skin break down noted on exam. no rashes    Will cont to monitor and assist as needed.  Lindalou Hose, RN 02/05/2015 8:34 PM

## 2015-02-05 NOTE — Progress Notes (Signed)
Pt admitted from ED per stretcher. Oriented to room, CHG bath done already positive MRSA on 01/18/15 so not reswabbed. Placed on contact isolation.

## 2015-02-05 NOTE — ED Notes (Signed)
Patient was given a [place] sandwich bag with a cup of sprite.

## 2015-02-06 DIAGNOSIS — E878 Other disorders of electrolyte and fluid balance, not elsewhere classified: Secondary | ICD-10-CM | POA: Diagnosis not present

## 2015-02-06 DIAGNOSIS — E871 Hypo-osmolality and hyponatremia: Secondary | ICD-10-CM

## 2015-02-06 DIAGNOSIS — R1013 Epigastric pain: Principal | ICD-10-CM

## 2015-02-06 DIAGNOSIS — F1024 Alcohol dependence with alcohol-induced mood disorder: Secondary | ICD-10-CM

## 2015-02-06 DIAGNOSIS — E876 Hypokalemia: Secondary | ICD-10-CM

## 2015-02-06 DIAGNOSIS — E43 Unspecified severe protein-calorie malnutrition: Secondary | ICD-10-CM

## 2015-02-06 LAB — BASIC METABOLIC PANEL
Anion gap: 9 (ref 5–15)
BUN: 8 mg/dL (ref 6–20)
CO2: 37 mmol/L — ABNORMAL HIGH (ref 22–32)
CREATININE: 0.88 mg/dL (ref 0.61–1.24)
Calcium: 8 mg/dL — ABNORMAL LOW (ref 8.9–10.3)
Chloride: 80 mmol/L — ABNORMAL LOW (ref 101–111)
GLUCOSE: 111 mg/dL — AB (ref 65–99)
Potassium: 3.1 mmol/L — ABNORMAL LOW (ref 3.5–5.1)
Sodium: 126 mmol/L — ABNORMAL LOW (ref 135–145)

## 2015-02-06 LAB — CBC
HCT: 36.3 % — ABNORMAL LOW (ref 39.0–52.0)
Hemoglobin: 12.6 g/dL — ABNORMAL LOW (ref 13.0–17.0)
MCH: 30.4 pg (ref 26.0–34.0)
MCHC: 34.7 g/dL (ref 30.0–36.0)
MCV: 87.5 fL (ref 78.0–100.0)
PLATELETS: 232 10*3/uL (ref 150–400)
RBC: 4.15 MIL/uL — ABNORMAL LOW (ref 4.22–5.81)
RDW: 16.9 % — AB (ref 11.5–15.5)
WBC: 7.3 10*3/uL (ref 4.0–10.5)

## 2015-02-06 LAB — TROPONIN I: Troponin I: 0.03 ng/mL (ref <0.031)

## 2015-02-06 MED ORDER — ACETAMINOPHEN 325 MG PO TABS: 650.0000 mg | ORAL_TABLET | Freq: Four times a day (QID) | ORAL | Status: DC | PRN

## 2015-02-06 MED ORDER — ACETAMINOPHEN 500 MG PO TABS
500.0000 mg | ORAL_TABLET | Freq: Four times a day (QID) | ORAL | Status: DC | PRN
  Administered 2015-02-06 – 2015-02-07 (×3): 500 mg via ORAL
  Filled 2015-02-06 (×4): qty 1

## 2015-02-06 NOTE — Progress Notes (Signed)
PROGRESS NOTE  Samuel Reyes QBV:[id number] DOB: 1955-10-23 DOA: 02/05/2015 PCP: Lorayne Marek, MD  Brief history 59 year old male with a history of COPD, alcohol abuse, tobacco abuse, hypertension, severe protein calorie malnutrition, alcohol withdrawal seizure, hyponatremia, depression presents with 2-3 day history of nausea and vomiting with epigastric pain. Patient denies any fevers, chills, chest pain, hematemesis, hematochezia, melena, dysuria, hematuria. Unfortunately, the patient continues to drink a 12 pack of beer a day. He has been drinking for over 20 years. In addition, the patient takes 6 daily progress daily for headaches and epigastric discomfort. He denies any other illegal drug use. CT of the abdomen and pelvis at time of admission was negative for any acute findings but showed a small perigastric varices. Serum sodium was 120 at the time of admission. Assessment/Plan: Hyponatremia  -Multifactorial including dehydration, poor solute intake, and beer potomania -continue IVF-->improvind -baseline Na = 127-132 AKI -secondary to volume depletion -improving with IVF-->continue -Baseline creatinine 0.5-0.7 Epigastric abdominal pain  -Secondary to gastritis/esophagitis  -Patient uses Goody powders 6 times daily for many years  -The patient also drinks 12 pack of beer daily  -Continue PPI  -tums prn -give GI cocktail x 1 -  02/05/2015 CT abdomen and pelvis negative for acute findings, negative for pancreatitis Chronic daily headache -Patient has analgesia headaches secondary to daily NSAID use and hyponatremia -Discontinue Goody powders - minimize NSAIDS Hypokalemia -Replete -Magnesium 2.1 Alcohol abuse -Alcohol withdrawal protocol Tobacco abuse with COPD -Tobacco cessation discussed -nicoderm patch severe protein calorie malnutrition  -Nutrition supplementation    Family Communication:   Pt at beside Disposition Plan:   Home 02/07/15 if  stable       Procedures/Studies: Ct Head Wo Contrast  01/18/2015   CLINICAL DATA:  New onset grand mal seizure. Dizziness and headache postictal  EXAM: CT HEAD WITHOUT CONTRAST  TECHNIQUE: Contiguous axial images were obtained from the base of the skull through the vertex without intravenous contrast.  COMPARISON:  None.  FINDINGS: There is mild diffuse atrophy. There is no intracranial mass, hemorrhage, extra-axial fluid collection, or midline shift. Gray-white compartments are normal. No acute infarct apparent. Bony calvarium appears intact. The mastoid air cells are clear.  IMPRESSION: Mild diffuse atrophy.  Study otherwise unremarkable.   Electronically Signed   By: Lowella Grip III M.D.   On: 01/18/2015 14:53   Mr Jeri Cos KC Contrast  01/19/2015   CLINICAL DATA:  New onset seizures. Lethargy. Right face and arm numbness. History of alcohol abuse.  EXAM: MRI HEAD WITHOUT AND WITH CONTRAST  TECHNIQUE: Multiplanar, multiecho pulse sequences of the brain and surrounding structures were obtained without and with intravenous contrast.  CONTRAST:  26mL MULTIHANCE GADOBENATE DIMEGLUMINE 529 MG/ML IV SOLN  COMPARISON:  Head CT 01/18/2015  FINDINGS: Dedicated thin section imaging through the temporal lobes demonstrates symmetric volume and normal signal of the hippocampi. There is mild-to-moderate generalized cerebral atrophy, advanced for age. There is no evidence of acute infarct, intracranial hemorrhage, mass, midline shift, or extra-axial fluid collection. No significant cerebral white matter disease is identified. There is mild patchy T2 hyperintensity in the pons which is nonspecific but may reflect mild chronic small vessel ischemic change.  Orbits are unremarkable. Trace right mastoid effusion is noted. Paranasal sinuses are clear. Major intracranial vascular flow voids are preserved.  IMPRESSION: 1. No acute intracranial abnormality. No etiology of seizures identified. 2. Age advanced cerebral  atrophy.   Electronically Signed   By: Zenia Resides  Jeralyn Ruths   On: 01/19/2015 15:00   Ct Abdomen Pelvis W Contrast  02/05/2015   CLINICAL DATA:  Epigastric pain, nausea/vomiting x3 weeks, history of cirrhosis  EXAM: CT ABDOMEN AND PELVIS WITH CONTRAST  TECHNIQUE: Multidetector CT imaging of the abdomen and pelvis was performed using the standard protocol following bolus administration of intravenous contrast.  CONTRAST:  168mL OMNIPAQUE IOHEXOL 300 MG/ML  SOLN  COMPARISON:  None.  FINDINGS: Lower chest:  Minimal dependent atelectasis at the right lung base.  Hepatobiliary: Liver is notable for hepatic steatosis with mild focal fatty sparing along the gallbladder fossa. Focal fat/altered perfusion along the falciform ligament. Mildly macronodular lobular contour of the left liver (series 21/image 21).  Gallbladder is unremarkable. No intrahepatic or extrahepatic ductal dilatation.  Pancreas: Within normal limits.  Spleen: Within normal limits.  Adrenals/Urinary Tract: Adrenal glands are within normal limits.  Kidneys are within normal limits.  No hydronephrosis.  Bladder is within normal limits.  Stomach/Bowel: Stomach is notable for postsurgical changes at the GE junction.  No evidence of bowel obstruction.  Appendix is not discretely visualized.  Vascular/Lymphatic: Atherosclerotic calcifications of the abdominal aorta and branch vessels.  Small perigastric varices.  Portal vein is patent.  No suspicious abdominopelvic lymphadenopathy.  Reproductive: Prostate is grossly unremarkable.  Other: No abdominopelvic ascites.  Postsurgical changes from prior right inguinal hernia repair.  Musculoskeletal: Degenerative changes of the lumbar spine.  IMPRESSION: No evidence of bowel obstruction.  Prior surgical changes at the GE junction.  Suspected mild changes of cirrhosis. No focal hepatic lesion is seen. Superimposed hepatic steatosis with focal fatty sparing.  No CT findings to account for the patient's abdominal pain.    Electronically Signed   By: Julian Hy M.D.   On: 02/05/2015 10:48         Subjective: patient complains of a bifrontal headache. States that his nausea and vomiting have improved. Complains of intermittent epigastric pain. Denies any hematemesis, diarrhea, hematochezia, melena, dysuria, hematuria. No visual disturbance or focal extremity weakness.   Objective: Filed Vitals:   02/05/15 1902 02/05/15 2151 02/06/15 0051 02/06/15 0509  BP: 118/83 117/74 97/64 108/64  Pulse: 103 96 93 96  Temp: 98.2 F (36.8 C) 97.7 F (36.5 C) 98.4 F (36.9 C) 98 F (36.7 C)  TempSrc: Oral Oral Oral Oral  Resp: 14 15 17 16   Height:      Weight:      SpO2: 94% 92% 94% 96%    Intake/Output Summary (Last 24 hours) at 02/06/15 1203 Last data filed at 02/06/15 1144  Gross per 24 hour  Intake   2565 ml  Output   1595 ml  Net    970 ml   Weight change:  Exam:   General:  Pt is alert, follows commands appropriately, not in acute distress  HEENT: No icterus, No thrush, No neck mass, [place]/AT  Cardiovascular: RRR, S1/S2, no rubs, no gallops  Respiratory: diminished breath sounds. Bibasilar crackles. No wheezing  Abdomen: Soft/epigastric tenderness without any reboundd, no guarding; no hepatosplenomegaly  Extremities: trace LE edema, No lymphangitis, No petechiae, No rashes, no synovitis; no cyanosis or clubbing   Data Reviewed: Basic Metabolic Panel:  Recent Labs Lab 02/05/15 0759 02/05/15 1633 02/06/15 0412  NA 121* 120* 126*  K 2.2* 2.0* 3.1*  CL <65* 69* 80*  CO2 35* 40* 37*  GLUCOSE 102* 155* 111*  BUN 11 10 8   CREATININE 1.23 1.03 0.88  CALCIUM 9.0 8.2* 8.0*  MG  --  2.1  --  Liver Function Tests:  Recent Labs Lab 02/05/15 0759  AST 40  ALT 70*  ALKPHOS 89  BILITOT 1.8*  PROT 6.4*  ALBUMIN 3.5    Recent Labs Lab 02/05/15 0759  LIPASE 18*   No results for input(s): AMMONIA in the last 168 hours. CBC:  Recent Labs Lab 02/05/15 0759 02/06/15 0412   WBC 12.8* 7.3  NEUTROABS 10.5*  --   HGB 15.5 12.6*  HCT 42.7 36.3*  MCV 83.9 87.5  PLT 318 232   Cardiac Enzymes:  Recent Labs Lab 02/05/15 0759 02/05/15 1633 02/05/15 2130 02/06/15 0412  TROPONINI 0.04* 0.03 0.03 0.03   BNP: Invalid input(s): POCBNP CBG: No results for input(s): GLUCAP in the last 168 hours.  No results found for this or any previous visit (from the past 240 hour(s)).   Scheduled Meds: . feeding supplement (ENSURE ENLIVE)  237 mL Oral BID BM  . folic acid  1 mg Oral Daily  . multivitamin with minerals  1 tablet Oral Daily  . nicotine  21 mg Transdermal Daily  . pantoprazole  40 mg Oral BID  . potassium chloride  40 mEq Oral TID  . thiamine  100 mg Oral Daily   Or  . thiamine  100 mg Intravenous Daily  . trazodone  300 mg Oral QHS   Continuous Infusions: . sodium chloride 1,000 mL (02/06/15 1138)     Samuel Fortson, DO  [facility] Pager [phone]  If 7PM-7AM, please contact night-coverage [email or web address] Password [email or web address] 02/06/2015, 12:03 PM   LOS: 1 day

## 2015-02-07 DIAGNOSIS — E878 Other disorders of electrolyte and fluid balance, not elsewhere classified: Secondary | ICD-10-CM

## 2015-02-07 DIAGNOSIS — E876 Hypokalemia: Secondary | ICD-10-CM | POA: Diagnosis not present

## 2015-02-07 DIAGNOSIS — R1013 Epigastric pain: Secondary | ICD-10-CM | POA: Diagnosis not present

## 2015-02-07 DIAGNOSIS — E871 Hypo-osmolality and hyponatremia: Secondary | ICD-10-CM | POA: Diagnosis not present

## 2015-02-07 LAB — CBC
HEMATOCRIT: 33.9 % — AB (ref 39.0–52.0)
HEMOGLOBIN: 11.3 g/dL — AB (ref 13.0–17.0)
MCH: 29.5 pg (ref 26.0–34.0)
MCHC: 33.3 g/dL (ref 30.0–36.0)
MCV: 88.5 fL (ref 78.0–100.0)
Platelets: 245 10*3/uL (ref 150–400)
RBC: 3.83 MIL/uL — AB (ref 4.22–5.81)
RDW: 17.5 % — AB (ref 11.5–15.5)
WBC: 6.5 10*3/uL (ref 4.0–10.5)

## 2015-02-07 LAB — BASIC METABOLIC PANEL
Anion gap: 6 (ref 5–15)
BUN: 9 mg/dL (ref 6–20)
CO2: 32 mmol/L (ref 22–32)
Calcium: 7.5 mg/dL — ABNORMAL LOW (ref 8.9–10.3)
Chloride: 90 mmol/L — ABNORMAL LOW (ref 101–111)
Creatinine, Ser: 0.83 mg/dL (ref 0.61–1.24)
GFR calc Af Amer: >60 mL/min (ref >60)
GFR calc non Af Amer: >60 mL/min (ref >60)
GLUCOSE: 101 mg/dL — AB (ref 65–99)
POTASSIUM: 3.1 mmol/L — AB (ref 3.5–5.1)
Sodium: 128 mmol/L — ABNORMAL LOW (ref 135–145)

## 2015-02-07 MED ORDER — GI COCKTAIL ~~LOC~~
30.0000 mL | Freq: Once | ORAL | Status: AC
  Administered 2015-02-07: 30 mL via ORAL

## 2015-02-07 MED ORDER — POTASSIUM CHLORIDE CRYS ER 20 MEQ PO TBCR
40.0000 meq | EXTENDED_RELEASE_TABLET | Freq: Once | ORAL | Status: AC
  Administered 2015-02-07: 40 meq via ORAL
  Filled 2015-02-07: qty 2

## 2015-02-07 MED ORDER — OMEPRAZOLE 40 MG PO CPDR: 40.0000 mg | DELAYED_RELEASE_CAPSULE | Freq: Every day | ORAL | Status: DC

## 2015-02-07 NOTE — Progress Notes (Signed)
Nsg Discharge Note  Admit Date:  02/05/2015 Discharge date: 02/07/2015   Dhilan Brauer to be D/C'd Home per MD order.  AVS completed.  Copy for chart, and copy for patient signed, and dated. Patient/caregiver able to verbalize understanding.  Discharge Medication:   Medication List    STOP taking these medications        nicotine 21 mg/24hr patch  Commonly known as:  NICODERM CQ - dosed in mg/24 hours     pantoprazole 40 MG tablet  Commonly known as:  PROTONIX      TAKE these medications        acamprosate 333 MG tablet  Commonly known as:  CAMPRAL  Take 2 tablets (666 mg total) by mouth 3 (three) times daily with meals. For alcoholism     albuterol 108 (90 BASE) MCG/ACT inhaler  Commonly known as:  PROVENTIL HFA;VENTOLIN HFA  Inhale 2 puffs into the lungs every 6 (six) hours as needed for wheezing or shortness of breath.     albuterol-ipratropium 18-103 MCG/ACT inhaler  Commonly known as:  COMBIVENT  Inhale 2 puffs into the lungs 2 (two) times daily. For shortness of breath     feeding supplement (ENSURE ENLIVE) Liqd  Take 237 mLs by mouth 2 (two) times daily between meals.     folic acid 1 MG tablet  Commonly known as:  FOLVITE  Take 1 tablet (1 mg total) by mouth daily.     gabapentin 300 MG capsule  Commonly known as:  NEURONTIN  Take 1 capsule (300 mg total) by mouth 2 (two) times daily. For substance withdrawal syndrome     multivitamin with minerals Tabs tablet  Take 1 tablet by mouth daily.     omeprazole 40 MG capsule  Commonly known as:  PRILOSEC  Take 1 capsule (40 mg total) by mouth daily.     traZODone 150 MG tablet  Commonly known as:  DESYREL  Take 2 tablets (300 mg total) by mouth at bedtime. For sleep        Discharge Assessment: Filed Vitals:   02/07/15 0429  BP: 113/72  Pulse: 85  Temp: 98.5 F (36.9 C)  Resp: 15   Skin clean, dry and intact without evidence of skin break down, no evidence of skin tears noted. IV catheter  discontinued intact. Site without signs and symptoms of complications - no redness or edema noted at insertion site, patient denies c/o pain - only slight tenderness at site.  Dressing with slight pressure applied.  D/c Instructions-Education: Discharge instructions given to patient/family with verbalized understanding. D/c education completed with patient/family including follow up instructions, medication list, d/c activities limitations if indicated, with other d/c instructions as indicated by MD - patient able to verbalize understanding, all questions fully answered. Patient instructed to return to ED, call 911, or call MD for any changes in condition.  Patient escorted via [place], and D/C home via private auto.  Dayle Points, RN 02/07/2015 11:06 AM

## 2015-02-07 NOTE — Discharge Summary (Signed)
Physician Discharge Summary  Samuel Reyes EXH:[id number] DOB: [birth year] DOA: 02/05/2015  PCP: Lorayne Marek, MD  Admit date: 02/05/2015 Discharge date: 02/07/2015  Recommendations for Outpatient Follow-up:  1. Pt will need to follow up with PCP in 2 weeks post discharge 2. Please obtain BMP to evaluate electrolytes and kidney function 3. Please also check CBC to evaluate Hg and Hct levels   Discharge Diagnoses:  Hyponatremia  -Multifactorial including dehydration, poor solute intake, and beer potomania -continue IVF-->improvind -baseline Na = 127-132 -Na = 128 on day of discharge AKI -secondary to volume depletion -improving with IVF-->continue -Baseline creatinine 0.5-0.8 -serum creatinine 1.2-->0.8 on day of d/c Epigastric abdominal pain  -Secondary to gastritis/esophagitis due to Etoh and NSAIDS -Patient uses [employer] powders 6 times daily for many years  -The patient also drinks 12 pack of beer daily  -Continue PPI --home with omeprazole 40mg  bid -give GI cocktail x 1--helps - 02/05/2015 CT abdomen and pelvis negative for acute findings, negative for pancreatitis Chronic daily headache -Patient has analgesia headaches secondary to daily NSAID use and hyponatremia -Discontinue Goody powders - minimize NSAIDS -no focal neuro deficits Hypokalemia -Repleteed -Magnesium 2.1 Alcohol abuse -Alcohol withdrawal protocol Tobacco abuse with COPD -Tobacco cessation discussed -nicoderm patch severe protein calorie malnutrition  -Nutrition supplementation  Discharge Condition: stable  Disposition: home  Diet:heart healthy Wt Readings from Last 3 Encounters:  02/05/15 60.011 kg (132 lb 4.8 oz)  01/18/15 63.8 kg (140 lb 10.5 oz)  01/05/15 65.726 kg (144 lb 14.4 oz)    History of present illness:  59 year old male with a history of COPD, alcohol abuse, tobacco abuse, hypertension, severe protein calorie malnutrition, alcohol withdrawal seizure, hyponatremia,  depression presents with 2-3 day history of nausea and vomiting with epigastric pain. Patient denies any fevers, chills, chest pain, hematemesis, hematochezia, melena, dysuria, hematuria. Unfortunately, the patient continues to drink a 12 pack of beer a day. He has been drinking for over 20 years. In addition, the patient takes 6 Goody's powders daily for headaches and epigastric discomfort for many years. He denies any other illegal drug use. CT of the abdomen and pelvis at time of admission was negative for any acute findings but showed a small perigastric varices. Serum sodium was 120 at the time of admission.  Pt was started on IVF and diet was advanced.  His Na improved to 128.  Pt abdominal pain improved with GI cocktail.  He will be sent home with omeprazole 40mg  bid.  Abstinence from Etoh and NSAIDS was discussed.    Discharge Exam: Filed Vitals:   02/07/15 0429  BP: 113/72  Pulse: 85  Temp: 98.5 F (36.9 C)  Resp: 15   Filed Vitals:   02/06/15 1231 02/06/15 1825 02/06/15 2112 02/07/15 0429  BP: 103/58 130/68 111/63 113/72  Pulse: 105 102 89 85  Temp: 98.4 F (36.9 C) 98.1 F (36.7 C) 98.6 F (37 C) 98.5 F (36.9 C)  TempSrc: Oral Oral Oral Oral  Resp: 14 20 16 15   Height:      Weight:      SpO2: 96% 100% 98% 96%   General: A&O x 3, NAD, pleasant, cooperative Cardiovascular: RRR, no rub, no gallop, no S3 Respiratory: bibasilar rales without wheeze Abdomen:soft, epigastric tenderness, nondistended, positive bowel sounds Extremities: trace LE edema, No lymphangitis, no petechiae  Discharge Instructions      Discharge Instructions    Diet - low sodium heart healthy    Complete by:  As directed      Increase  activity slowly    Complete by:  As directed             Medication List    STOP taking these medications        nicotine 21 mg/24hr patch  Commonly known as:  NICODERM CQ - dosed in mg/24 hours     pantoprazole 40 MG tablet  Commonly known as:  PROTONIX       TAKE these medications        acamprosate 333 MG tablet  Commonly known as:  CAMPRAL  Take 2 tablets (666 mg total) by mouth 3 (three) times daily with meals. For alcoholism     albuterol 108 (90 BASE) MCG/ACT inhaler  Commonly known as:  PROVENTIL HFA;VENTOLIN HFA  Inhale 2 puffs into the lungs every 6 (six) hours as needed for wheezing or shortness of breath.     albuterol-ipratropium 18-103 MCG/ACT inhaler  Commonly known as:  COMBIVENT  Inhale 2 puffs into the lungs 2 (two) times daily. For shortness of breath     feeding supplement (ENSURE ENLIVE) Liqd  Take 237 mLs by mouth 2 (two) times daily between meals.     folic acid 1 MG tablet  Commonly known as:  FOLVITE  Take 1 tablet (1 mg total) by mouth daily.     gabapentin 300 MG capsule  Commonly known as:  NEURONTIN  Take 1 capsule (300 mg total) by mouth 2 (two) times daily. For substance withdrawal syndrome     multivitamin with minerals Tabs tablet  Take 1 tablet by mouth daily.     omeprazole 40 MG capsule  Commonly known as:  PRILOSEC  Take 1 capsule (40 mg total) by mouth daily.     traZODone 150 MG tablet  Commonly known as:  DESYREL  Take 2 tablets (300 mg total) by mouth at bedtime. For sleep         The results of significant diagnostics from this hospitalization (including imaging, microbiology, ancillary and laboratory) are listed below for reference.    Significant Diagnostic Studies: Ct Head Wo Contrast  01/18/2015   CLINICAL DATA:  New onset grand mal seizure. Dizziness and headache postictal  EXAM: CT HEAD WITHOUT CONTRAST  TECHNIQUE: Contiguous axial images were obtained from the base of the skull through the vertex without intravenous contrast.  COMPARISON:  None.  FINDINGS: There is mild diffuse atrophy. There is no intracranial mass, hemorrhage, extra-axial fluid collection, or midline shift. Gray-white compartments are normal. No acute infarct apparent. Bony calvarium appears intact. The  mastoid air cells are clear.  IMPRESSION: Mild diffuse atrophy.  Study otherwise unremarkable.   Electronically Signed   By: Lowella Grip III M.D.   On: 01/18/2015 14:53   Mr Jeri Cos DG Contrast  01/19/2015   CLINICAL DATA:  New onset seizures. Lethargy. Right face and arm numbness. History of alcohol abuse.  EXAM: MRI HEAD WITHOUT AND WITH CONTRAST  TECHNIQUE: Multiplanar, multiecho pulse sequences of the brain and surrounding structures were obtained without and with intravenous contrast.  CONTRAST:  16mL MULTIHANCE GADOBENATE DIMEGLUMINE 529 MG/ML IV SOLN  COMPARISON:  Head CT 01/18/2015  FINDINGS: Dedicated thin section imaging through the temporal lobes demonstrates symmetric volume and normal signal of the hippocampi. There is mild-to-moderate generalized cerebral atrophy, advanced for age. There is no evidence of acute infarct, intracranial hemorrhage, mass, midline shift, or extra-axial fluid collection. No significant cerebral white matter disease is identified. There is mild patchy T2 hyperintensity in the pons which  is nonspecific but may reflect mild chronic small vessel ischemic change.  Orbits are unremarkable. Trace right mastoid effusion is noted. Paranasal sinuses are clear. Major intracranial vascular flow voids are preserved.  IMPRESSION: 1. No acute intracranial abnormality. No etiology of seizures identified. 2. Age advanced cerebral atrophy.   Electronically Signed   By: Logan Bores   On: 01/19/2015 15:00   Ct Abdomen Pelvis W Contrast  02/05/2015   CLINICAL DATA:  Epigastric pain, nausea/vomiting x3 weeks, history of cirrhosis  EXAM: CT ABDOMEN AND PELVIS WITH CONTRAST  TECHNIQUE: Multidetector CT imaging of the abdomen and pelvis was performed using the standard protocol following bolus administration of intravenous contrast.  CONTRAST:  172mL OMNIPAQUE IOHEXOL 300 MG/ML  SOLN  COMPARISON:  None.  FINDINGS: Lower chest:  Minimal dependent atelectasis at the right lung base.   Hepatobiliary: Liver is notable for hepatic steatosis with mild focal fatty sparing along the gallbladder fossa. Focal fat/altered perfusion along the falciform ligament. Mildly macronodular lobular contour of the left liver (series 21/image 21).  Gallbladder is unremarkable. No intrahepatic or extrahepatic ductal dilatation.  Pancreas: Within normal limits.  Spleen: Within normal limits.  Adrenals/Urinary Tract: Adrenal glands are within normal limits.  Kidneys are within normal limits.  No hydronephrosis.  Bladder is within normal limits.  Stomach/Bowel: Stomach is notable for postsurgical changes at the GE junction.  No evidence of bowel obstruction.  Appendix is not discretely visualized.  Vascular/Lymphatic: Atherosclerotic calcifications of the abdominal aorta and branch vessels.  Small perigastric varices.  Portal vein is patent.  No suspicious abdominopelvic lymphadenopathy.  Reproductive: Prostate is grossly unremarkable.  Other: No abdominopelvic ascites.  Postsurgical changes from prior right inguinal hernia repair.  Musculoskeletal: Degenerative changes of the lumbar spine.  IMPRESSION: No evidence of bowel obstruction.  Prior surgical changes at the GE junction.  Suspected mild changes of cirrhosis. No focal hepatic lesion is seen. Superimposed hepatic steatosis with focal fatty sparing.  No CT findings to account for the patient's abdominal pain.   Electronically Signed   By: Julian Hy M.D.   On: 02/05/2015 10:48     Microbiology: No results found for this or any previous visit (from the past 240 hour(s)).   Labs: Basic Metabolic Panel:  Recent Labs Lab 02/05/15 0759 02/05/15 1633 02/06/15 0412 02/07/15 0455  NA 121* 120* 126* 128*  K 2.2* 2.0* 3.1* 3.1*  CL <65* 69* 80* 90*  CO2 35* 40* 37* 32  GLUCOSE 102* 155* 111* 101*  BUN 11 10 8 9   CREATININE 1.23 1.03 0.88 0.83  CALCIUM 9.0 8.2* 8.0* 7.5*  MG  --  2.1  --   --    Liver Function Tests:  Recent Labs Lab  02/05/15 0759  AST 40  ALT 70*  ALKPHOS 89  BILITOT 1.8*  PROT 6.4*  ALBUMIN 3.5    Recent Labs Lab 02/05/15 0759  LIPASE 18*   No results for input(s): AMMONIA in the last 168 hours. CBC:  Recent Labs Lab 02/05/15 0759 02/06/15 0412 02/07/15 0455  WBC 12.8* 7.3 6.5  NEUTROABS 10.5*  --   --   HGB 15.5 12.6* 11.3*  HCT 42.7 36.3* 33.9*  MCV 83.9 87.5 88.5  PLT 318 232 245   Cardiac Enzymes:  Recent Labs Lab 02/05/15 0759 02/05/15 1633 02/05/15 2130 02/06/15 0412  TROPONINI 0.04* 0.03 0.03 0.03   BNP: Invalid input(s): POCBNP CBG: No results for input(s): GLUCAP in the last 168 hours.  Time coordinating discharge:  Greater than 30 minutes  Signed:  Brandilyn Nanninga, DO [facility] Pager: [phone] 02/07/2015, 8:07 AM

## 2015-02-18 ENCOUNTER — Other Ambulatory Visit: Payer: Self-pay | Admitting: Internal Medicine

## 2015-03-05 ENCOUNTER — Emergency Department (HOSPITAL_COMMUNITY)
Admission: EM | Admit: 2015-03-05 | Discharge: 2015-03-06 | Disposition: A | Discharge status: Home / Self Care | Payer: Medicaid Other | Attending: Emergency Medicine | Admitting: Emergency Medicine

## 2015-03-05 ENCOUNTER — Emergency Department (HOSPITAL_COMMUNITY): Payer: Medicaid Other

## 2015-03-05 ENCOUNTER — Encounter (HOSPITAL_COMMUNITY): Payer: Self-pay | Admitting: *Deleted

## 2015-03-05 DIAGNOSIS — K219 Gastro-esophageal reflux disease without esophagitis: Secondary | ICD-10-CM | POA: Insufficient documentation

## 2015-03-05 DIAGNOSIS — R5383 Other fatigue: Secondary | ICD-10-CM | POA: Insufficient documentation

## 2015-03-05 DIAGNOSIS — F1011 Alcohol abuse, in remission: Secondary | ICD-10-CM

## 2015-03-05 DIAGNOSIS — F1010 Alcohol abuse, uncomplicated: Secondary | ICD-10-CM | POA: Diagnosis not present

## 2015-03-05 DIAGNOSIS — F419 Anxiety disorder, unspecified: Secondary | ICD-10-CM | POA: Insufficient documentation

## 2015-03-05 DIAGNOSIS — R51 Headache: Secondary | ICD-10-CM | POA: Insufficient documentation

## 2015-03-05 DIAGNOSIS — Z79899 Other long term (current) drug therapy: Secondary | ICD-10-CM | POA: Insufficient documentation

## 2015-03-05 DIAGNOSIS — J449 Chronic obstructive pulmonary disease, unspecified: Secondary | ICD-10-CM | POA: Insufficient documentation

## 2015-03-05 DIAGNOSIS — Z720 Tobacco use: Secondary | ICD-10-CM | POA: Diagnosis not present

## 2015-03-05 DIAGNOSIS — R479 Unspecified speech disturbances: Secondary | ICD-10-CM

## 2015-03-05 DIAGNOSIS — Z880 Allergy status to penicillin: Secondary | ICD-10-CM | POA: Insufficient documentation

## 2015-03-05 DIAGNOSIS — Z8673 Personal history of transient ischemic attack (TIA), and cerebral infarction without residual deficits: Secondary | ICD-10-CM | POA: Insufficient documentation

## 2015-03-05 DIAGNOSIS — I10 Essential (primary) hypertension: Secondary | ICD-10-CM | POA: Diagnosis not present

## 2015-03-05 DIAGNOSIS — R4789 Other speech disturbances: Secondary | ICD-10-CM | POA: Insufficient documentation

## 2015-03-05 DIAGNOSIS — R1013 Epigastric pain: Secondary | ICD-10-CM | POA: Insufficient documentation

## 2015-03-05 DIAGNOSIS — Z8739 Personal history of other diseases of the musculoskeletal system and connective tissue: Secondary | ICD-10-CM | POA: Diagnosis not present

## 2015-03-05 DIAGNOSIS — R519 Headache, unspecified: Secondary | ICD-10-CM

## 2015-03-05 LAB — DIFFERENTIAL
Basophils Absolute: 0.1 10*3/uL (ref 0.0–0.1)
Basophils Relative: 1 % (ref 0–1)
EOS PCT: 0 % (ref 0–5)
Eosinophils Absolute: 0 10*3/uL (ref 0.0–0.7)
Lymphocytes Relative: 25 % (ref 12–46)
Lymphs Abs: 2.1 10*3/uL (ref 0.7–4.0)
MONOS PCT: 13 % — AB (ref 3–12)
Monocytes Absolute: 1.1 10*3/uL — ABNORMAL HIGH (ref 0.1–1.0)
NEUTROS ABS: 5.2 10*3/uL (ref 1.7–7.7)
Neutrophils Relative %: 61 % (ref 43–77)

## 2015-03-05 LAB — RAPID URINE DRUG SCREEN, HOSP PERFORMED
Amphetamines: NOT DETECTED
BARBITURATES: NOT DETECTED
Benzodiazepines: NOT DETECTED
Cocaine: NOT DETECTED
OPIATES: NOT DETECTED
Tetrahydrocannabinol: NOT DETECTED

## 2015-03-05 LAB — URINALYSIS, ROUTINE W REFLEX MICROSCOPIC
Bilirubin Urine: NEGATIVE
Glucose, UA: NEGATIVE mg/dL
HGB URINE DIPSTICK: NEGATIVE
KETONES UR: NEGATIVE mg/dL
Leukocytes, UA: NEGATIVE
Nitrite: NEGATIVE
Protein, ur: NEGATIVE mg/dL
Specific Gravity, Urine: 1.006 (ref 1.005–1.030)
UROBILINOGEN UA: 0.2 mg/dL (ref 0.0–1.0)
pH: 5 (ref 5.0–8.0)

## 2015-03-05 LAB — COMPREHENSIVE METABOLIC PANEL
ALBUMIN: 3.3 g/dL — AB (ref 3.5–5.0)
ALT: 24 U/L (ref 17–63)
AST: 34 U/L (ref 15–41)
Alkaline Phosphatase: 102 U/L (ref 38–126)
Anion gap: 13 (ref 5–15)
CHLORIDE: 107 mmol/L (ref 101–111)
CO2: 18 mmol/L — AB (ref 22–32)
Calcium: 8.2 mg/dL — ABNORMAL LOW (ref 8.9–10.3)
Creatinine, Ser: 0.91 mg/dL (ref 0.61–1.24)
GLUCOSE: 79 mg/dL (ref 65–99)
POTASSIUM: 3.6 mmol/L (ref 3.5–5.1)
SODIUM: 138 mmol/L (ref 135–145)
TOTAL PROTEIN: 6.3 g/dL — AB (ref 6.5–8.1)
Total Bilirubin: 0.4 mg/dL (ref 0.3–1.2)

## 2015-03-05 LAB — CBC
HCT: 41.8 % (ref 39.0–52.0)
Hemoglobin: 14.1 g/dL (ref 13.0–17.0)
MCH: 31.3 pg (ref 26.0–34.0)
MCHC: 33.7 g/dL (ref 30.0–36.0)
MCV: 92.7 fL (ref 78.0–100.0)
PLATELETS: 343 10*3/uL (ref 150–400)
RBC: 4.51 MIL/uL (ref 4.22–5.81)
RDW: 18.1 % — ABNORMAL HIGH (ref 11.5–15.5)
WBC: 8.4 10*3/uL (ref 4.0–10.5)

## 2015-03-05 LAB — I-STAT CHEM 8, ED
BUN: 4 mg/dL — AB (ref 6–20)
CHLORIDE: 109 mmol/L (ref 101–111)
Calcium, Ion: 1.02 mmol/L — ABNORMAL LOW (ref 1.12–1.23)
Creatinine, Ser: 1.2 mg/dL (ref 0.61–1.24)
Glucose, Bld: 79 mg/dL (ref 65–99)
HEMATOCRIT: 42 % (ref 39.0–52.0)
Hemoglobin: 14.3 g/dL (ref 13.0–17.0)
Potassium: 3.8 mmol/L (ref 3.5–5.1)
SODIUM: 143 mmol/L (ref 135–145)
TCO2: 17 mmol/L (ref 0–100)

## 2015-03-05 LAB — APTT: aPTT: 24 seconds (ref 24–37)

## 2015-03-05 LAB — AMMONIA: Ammonia: 20 umol/L (ref 9–35)

## 2015-03-05 LAB — PROTIME-INR
INR: 0.9 (ref 0.00–1.49)
PROTHROMBIN TIME: 12.4 s (ref 11.6–15.2)

## 2015-03-05 LAB — ETHANOL: Alcohol, Ethyl (B): 282 mg/dL — ABNORMAL HIGH (ref <5)

## 2015-03-05 LAB — I-STAT TROPONIN, ED: Troponin i, poc: 0.01 ng/mL (ref 0.00–0.08)

## 2015-03-05 LAB — CBG MONITORING, ED: Glucose-Capillary: 81 mg/dL (ref 65–99)

## 2015-03-05 MED ORDER — LORAZEPAM 2 MG/ML IJ SOLN
1.0000 mg | Freq: Once | INTRAMUSCULAR | Status: AC
  Administered 2015-03-05: 1 mg via INTRAVENOUS
  Filled 2015-03-05: qty 1

## 2015-03-05 MED ORDER — GADOBENATE DIMEGLUMINE 529 MG/ML IV SOLN: 10.0000 mL | Freq: Once | INTRAVENOUS | Status: AC | PRN

## 2015-03-05 NOTE — ED Notes (Signed)
The pot has no teeth  Speech [place] difficult to understand.  He was unable t hold his rt leg because he has knee pain.  He was also able to hold his lt leg then dropped it on the bed

## 2015-03-05 NOTE — ED Notes (Signed)
Unable to do neuro checks pt in mri

## 2015-03-05 NOTE — ED Notes (Signed)
CBG 81.  

## 2015-03-05 NOTE — ED Notes (Signed)
The pt reports that he is an alcoholic and his last drink was this am.

## 2015-03-05 NOTE — ED Provider Notes (Signed)
CSN: [id number]     Arrival date & time 03/05/15  1932 History   First MD Initiated Contact with Patient 03/05/15 1935     Chief Complaint  Patient presents with  . Headache     (Consider location/radiation/quality/duration/timing/severity/associated sxs/prior Treatment) The history is provided by the patient and medical records. No language interpreter was used.  Patient with PMH cirrhosis 2/2 EtOH abuse, HTN, COPD who presents with difficulty speaking over last 4 days. Pt also relates he has felt increasingly more fatigued over last 12-24 hours. Pt states he has never had speech symptoms like this in past. States its like he is trying to say words but they are not coming out. Denies any chest pain, SOB, n/v/d. Pt currently intoxicated and states last drink was earlier this morning. States typically drinks 6 40 oz beers a day. States his symptoms have been constant since onset. Do not seem to be worsening. Pt also has associated headache today which he's had since yesterday. Had a fall yesterday but states his headache preceded the fall.   Past Medical History  Diagnosis Date  . Cirrhosis   . Degenerative joint disease   . Hypertension   . Gastric ulcer   . Depression   . Asthma   . Mitral valve prolapse 2002  . H/O hiatal hernia   . COPD (chronic obstructive pulmonary disease)   . TIA (transient ischemic attack)     2010  . Stroke     TIA - 2010 - no deficits   . Shortness of breath     with exertion   . GERD (gastroesophageal reflux disease)   . Headache(784.0)   . Hiatal hernia 1982  . Degenerative joint disease   . Anxiety   . Alcohol abuse    Past Surgical History  Procedure Laterality Date  . Gastrectomy    . Shoulder surgery Bilateral     3 surgeries on on left, 2 surgeries on right   . Rt knee arthroscopic surgery    . Back surgery      3 cervical spine surgeries C4-C5 fused  . Hernia repair    . Finger surgery Left     2nd, 3rd, & 4th fingers were cut off by  table saw and reattached  . Colonoscopy N/A 01/04/2014    Procedure: COLONOSCOPY;  Surgeon: Danie Binder, MD;  Location: [facility];  Service: Endoscopy;  Laterality: N/A;  1:45  . Esophagogastroduodenoscopy N/A 01/04/2014    Procedure: ESOPHAGOGASTRODUODENOSCOPY (EGD);  Surgeon: Danie Binder, MD;  Location: [facility];  Service: Endoscopy;  Laterality: N/A;  . Incisional hernia repair N/A 01/20/2014    Procedure: LAPAROSCOPIC RECURRENT  INCISIONAL HERNIA with mesh;  Surgeon: Edward Jolly, MD;  Location: [facility];  Service: General;  Laterality: N/A;   Family History  Problem Relation Age of Onset  . Cancer Father     bone  . Cancer Brother     lungs  . Stroke Maternal Grandmother   . Colon cancer Neg Hx   . Asthma Son     died at age 66 in his sleep   . Spina bifida Son     died at age 39    History  Substance Use Topics  . Smoking status: Current Every Day Smoker -- 1.00 packs/day for 45 years    Types: Cigarettes    Start date: 07/30/1966  . Smokeless tobacco: Never Used  . Alcohol Use: 3.6 oz/week    6 Cans  of beer per week     Comment: 1 case of beer and 1 shot of vodka between 1030am-1430pm    Review of Systems  Constitutional: Positive for fatigue (since earlier this morning). Negative for fever and chills.  HENT: Negative for congestion and rhinorrhea.   Eyes: Negative for photophobia and visual disturbance.  Respiratory: Negative for shortness of breath and wheezing.   Cardiovascular: Negative for chest pain and palpitations.  Gastrointestinal: Positive for abdominal pain (mild epigastric pain). Negative for nausea and vomiting.  Genitourinary: Negative for dysuria and difficulty urinating.  Musculoskeletal: Negative for back pain and neck pain.  Skin: Negative for pallor and rash.  Neurological: Positive for speech difficulty (as described in HPI) and headaches (since yesterday, not thunderclap onset). Negative for syncope, light-headedness and numbness.   Psychiatric/Behavioral: Negative for confusion and agitation.  All other systems reviewed and are negative.     Allergies  Bee venom and Penicillins  Home Medications   Prior to Admission medications   Medication Sig Start Date End Date Taking? Authorizing Provider  acamprosate (CAMPRAL) 333 MG tablet Take 2 tablets (666 mg total) by mouth 3 (three) times daily with meals. For alcoholism 01/06/15  Yes Charlynne Cousins, MD  albuterol (PROVENTIL HFA;VENTOLIN HFA) 108 (90 BASE) MCG/ACT inhaler Inhale 2 puffs into the lungs every 6 (six) hours as needed for wheezing or shortness of breath. 01/06/15  Yes Charlynne Cousins, MD  albuterol-ipratropium (COMBIVENT) 18-103 MCG/ACT inhaler Inhale 2 puffs into the lungs 2 (two) times daily. For shortness of breath 09/22/14  Yes Lorayne Marek, MD  folic acid (FOLVITE) 1 MG tablet Take 1 tablet (1 mg total) by mouth daily. 01/22/15  Yes Albertine Patricia, MD  gabapentin (NEURONTIN) 300 MG capsule Take 1 capsule (300 mg total) by mouth 2 (two) times daily. For substance withdrawal syndrome 01/06/15  Yes Charlynne Cousins, MD  Multiple Vitamin (MULTIVITAMIN WITH MINERALS) TABS tablet Take 1 tablet by mouth daily. 01/22/15  Yes Albertine Patricia, MD  omeprazole (PRILOSEC) 40 MG capsule Take 1 capsule (40 mg total) by mouth daily. 02/07/15  Yes Orson Eva, MD  trazodone (DESYREL) 150 MG tablet Take 2 tablets (300 mg total) by mouth at bedtime. For sleep 08/08/14  Yes Shuvon B Rankin, NP  feeding supplement, ENSURE ENLIVE, (ENSURE ENLIVE) LIQD Take 237 mLs by mouth 2 (two) times daily between meals. Patient not taking: Reported on 03/05/2015 01/22/15   Silver Huguenin Elgergawy, MD   BP 148/104 mmHg  Pulse 96  Temp(Src) 98.9 F (37.2 C) (Oral)  Resp 16  SpO2 99% Physical Exam  Constitutional: He is oriented to person, place, and time. No distress.  Appears disheveled  HENT:  Head: Normocephalic and atraumatic.  Eyes: Conjunctivae and EOM are normal.  Neck:  Normal range of motion. Neck supple.  Cardiovascular: Normal rate, regular rhythm and normal heart sounds.   No murmur heard. Pulmonary/Chest: Effort normal and breath sounds normal. No respiratory distress. He has no wheezes.  Abdominal: Soft. He exhibits no distension. There is tenderness (mild epigastric ttp). There is no guarding.  Musculoskeletal: Normal range of motion. He exhibits no tenderness.  Neurological: He is alert and oriented to person, place, and time. He has normal strength. No sensory deficit (intact throughout). GCS eye subscore is 4. GCS verbal subscore is 5. GCS motor subscore is 6.  5/5 strength in upper and lower bilaterally  Skin: Skin is warm and dry. He is not diaphoretic.  Psychiatric: He has a normal  mood and affect. His behavior is normal. His speech is delayed.  Speech is fragmented but makes sense, patient making abnormal tongue motion while speaking.   Nursing note and vitals reviewed.   ED Course  Procedures (including critical care time) Labs Review Labs Reviewed  CBC - Abnormal; Notable for the following:    RDW 18.1 (*)    All other components within normal limits  DIFFERENTIAL - Abnormal; Notable for the following:    Monocytes Relative 13 (*)    Monocytes Absolute 1.1 (*)    All other components within normal limits  COMPREHENSIVE METABOLIC PANEL - Abnormal; Notable for the following:    CO2 18 (*)    BUN <5 (*)    Calcium 8.2 (*)    Total Protein 6.3 (*)    Albumin 3.3 (*)    All other components within normal limits  ETHANOL - Abnormal; Notable for the following:    Alcohol, Ethyl (B) 282 (*)    All other components within normal limits  I-STAT CHEM 8, ED - Abnormal; Notable for the following:    BUN 4 (*)    Calcium, Ion 1.02 (*)    All other components within normal limits  PROTIME-INR  APTT  URINE RAPID DRUG SCREEN, HOSP PERFORMED  URINALYSIS, ROUTINE W REFLEX MICROSCOPIC (NOT AT [facility])  AMMONIA  CBG MONITORING, ED  I-STAT  TROPOININ, ED  CBG MONITORING, ED    Imaging Review Mr Jeri Cos Wo Contrast  03/06/2015   CLINICAL DATA:  Headache, subsequent fall yesterday, speech difficulty beginning at 5 a.m. History of stroke, hypertension, alcohol abuse. Evaluate abnormal mouth movements.  EXAM: MRI HEAD WITHOUT AND WITH CONTRAST  TECHNIQUE: Multiplanar, multiecho pulse sequences of the brain and surrounding structures were obtained without and with intravenous contrast.  CONTRAST:  10 cc MultiHance  COMPARISON:  CT head March 05, 2015  FINDINGS: Moderately motion degraded post gadolinium sequences. Additional sequences are mild to moderately motion degraded.  Moderate ventriculomegaly, likely on the basis of global parenchymal brain volume loss as there is overall commensurate enlargement of cerebral sulci and cerebellar folia. Minimal T2 hyperintense signal in the pons likely chronic small vessel ischemic disease. No abnormal parenchymal signal, mass lesions, mass effect. No abnormal parenchymal enhancement. No reduced diffusion to suggest acute ischemia. No susceptibility artifact to suggest hemorrhage.  No abnormal extra-axial fluid collections. No extra-axial masses nor leptomeningeal enhancement. Normal major intracranial vascular flow voids seen at the skull base. Motion degraded coronal thin sliced T2. Generally normal morphology, size and signal of the bilateral  Hippocampus.  Ocular globes and orbital contents are unremarkable though not tailored for evaluation. No abnormal sellar expansion. Trace LEFT mastoid effusion. Paranasal sinuses are well-aerated. No suspicious calvarial bone marrow signal. No abnormal sellar expansion. Craniocervical junction maintained. Patient is edentulous.  IMPRESSION: No acute intracranial process on this motion degraded examination.  Moderate global parenchymal brain volume loss, advanced for age.   Electronically Signed   By: Elon Alas M.D.   On: 03/06/2015 00:19     EKG  Interpretation   Date/Time:  Saturday March 05 2015 19:39:30 EDT Ventricular Rate:  116 PR Interval:  141 QRS Duration: 92 QT Interval:  320 QTC Calculation: 444 R Axis:   67 Text Interpretation:  Sinus tachycardia no significant change from last  month Confirmed by GOLDSTON  MD, SCOTT ([id number]) on 03/05/2015 7:53:49 PM      MDM   Final diagnoses:  Speech abnormality  History of alcohol abuse  New onset headache    Patient with PMH cirrhosis 2/2 EtOH abuse, HTN, COPD who presents with difficulty speaking over last 4 days.  Ddx includes: CVA, TIA, tardive dyskinesia, somatic symptom  Speech abnormality is only focal neuro deficit noted. Obtained CT head that does not show any acute changes. Also obtained CBC, chem 8, PT-INR, PTT, UA, CMP, EtOH, Blood glucose. -Hypoglycemia likely 2/2 chronic alcohol use and poor nutrition.  -Discussed with Dr. Doy Mince (neurology on call) and they recommend MRI for further evaluation of possible CVA.  -No abnormalities that would explain patient's symptoms on labs. -MRI shows no acute findings that would explain patient's symptoms. -On reeval after MRI pt's symptoms actually seemed to be improving. -Speech abnormality could be related to EtOH use. Tardive dyskinesia seems less likely given patient denies ever being on anti-psychotics.  -Discussed reassuring findings with patient and plan for discharge home, pt is agreeable with this plan. -Pt able to ambulate and tolerate PO intake at time of discharge, was clinically sober.     Theodosia Quay, MD 03/07/15 [id number]  Sherwood Gambler, MD 03/15/15 [phone]

## 2015-03-05 NOTE — ED Notes (Signed)
Pt passed swallow screen

## 2015-03-05 NOTE — ED Notes (Signed)
The pt arrived by [facility].  He is homeless  And lives outside.  He has had a headache and he fell yesterday he had a headache before he fell.  His friends report he has been ly around on a mattress all day and they are reporting that his speech is worse since 0500am .  He is alert  His cbg was in the 60s and he was given  Glucagon by emns.  Iv lt hand.  Alert oriented xr 4.  He is also c/o epigastric pain

## 2015-03-05 NOTE — ED Notes (Signed)
Pt to xray

## 2015-03-05 NOTE — ED Notes (Signed)
The pt has been in mri for approx 30 minutes

## 2015-03-05 NOTE — ED Notes (Signed)
Pt returned from  Xray and c-t.  Pt status unchanged

## 2015-03-06 MED ORDER — GADOBENATE DIMEGLUMINE 529 MG/ML IV SOLN
10.0000 mL | Freq: Once | INTRAVENOUS | Status: AC | PRN
  Administered 2015-03-06: 10 mL via INTRAVENOUS

## 2015-03-06 NOTE — Discharge Instructions (Signed)
Alcohol Use Disorder Alcohol use disorder is a mental disorder. It is not a one-time incident of heavy drinking. Alcohol use disorder is the excessive and uncontrollable use of alcohol over time that leads to problems with functioning in one or more areas of daily living. People with this disorder risk harming themselves and others when they drink to excess. Alcohol use disorder also can cause other mental disorders, such as mood and anxiety disorders, and serious physical problems. People with alcohol use disorder often misuse other drugs.  Alcohol use disorder is common and widespread. Some people with this disorder drink alcohol to cope with or escape from negative life events. Others drink to relieve chronic pain or symptoms of mental illness. People with a family history of alcohol use disorder are at higher risk of losing control and using alcohol to excess.  SYMPTOMS  Signs and symptoms of alcohol use disorder may include the following:   Consumption ofalcohol inlarger amounts or over a longer period of time than intended.  Multiple unsuccessful attempts to cutdown or control alcohol use.   A great deal of time spent obtaining alcohol, using alcohol, or recovering from the effects of alcohol (hangover).  A strong desire or urge to use alcohol (cravings).   Continued use of alcohol despite problems at work, school, or home because of alcohol use.   Continued use of alcohol despite problems in relationships because of alcohol use.  Continued use of alcohol in situations when it is physically hazardous, such as driving a car.  Continued use of alcohol despite awareness of a physical or psychological problem that is likely related to alcohol use. Physical problems related to alcohol use can involve the brain, heart, liver, stomach, and intestines. Psychological problems related to alcohol use include intoxication, depression, anxiety, psychosis, delirium, and dementia.   The need for  increased amounts of alcohol to achieve the same desired effect, or a decreased effect from the consumption of the same amount of alcohol (tolerance).  Withdrawal symptoms upon reducing or stopping alcohol use, or alcohol use to reduce or avoid withdrawal symptoms. Withdrawal symptoms include:  Racing heart.  Hand tremor.  Difficulty sleeping.  Nausea.  Vomiting.  Hallucinations.  Restlessness.  Seizures. DIAGNOSIS Alcohol use disorder is diagnosed through an assessment by your health care provider. Your health care provider may start by asking three or four questions to screen for excessive or problematic alcohol use. To confirm a diagnosis of alcohol use disorder, at least two symptoms must be present within a 12-month period. The severity of alcohol use disorder depends on the number of symptoms:  Mild--two or three.  Moderate--four or five.  Severe--six or more. Your health care provider may perform a physical exam or use results from lab tests to see if you have physical problems resulting from alcohol use. Your health care provider may refer you to a mental health professional for evaluation. TREATMENT  Some people with alcohol use disorder are able to reduce their alcohol use to low-risk levels. Some people with alcohol use disorder need to quit drinking alcohol. When necessary, mental health professionals with specialized training in substance use treatment can help. Your health care provider can help you decide how severe your alcohol use disorder is and what type of treatment you need. The following forms of treatment are available:   Detoxification. Detoxification involves the use of prescription medicines to prevent alcohol withdrawal symptoms in the first week after quitting. This is important for people with a history of symptoms   of withdrawal and for heavy drinkers who are likely to have withdrawal symptoms. Alcohol withdrawal can be dangerous and, in severe cases, cause  death. Detoxification is usually provided in a hospital or in-patient substance use treatment facility.  Counseling or talk therapy. Talk therapy is provided by substance use treatment counselors. It addresses the reasons people use alcohol and ways to keep them from drinking again. The goals of talk therapy are to help people with alcohol use disorder find healthy activities and ways to cope with life stress, to identify and avoid triggers for alcohol use, and to handle cravings, which can cause relapse.  Medicines.Different medicines can help treat alcohol use disorder through the following actions:  Decrease alcohol cravings.  Decrease the positive reward response felt from alcohol use.  Produce an uncomfortable physical reaction when alcohol is used (aversion therapy).  Support groups. Support groups are run by people who have quit drinking. They provide emotional support, advice, and guidance. These forms of treatment are often combined. Some people with alcohol use disorder benefit from intensive combination treatment provided by specialized substance use treatment centers. Both inpatient and outpatient treatment programs are available. Document Released: 08/23/2004 Document Revised: 11/30/2013 Document Reviewed: 10/23/2012 [employer] Patient Information 2015 [employer]. This information is not intended to replace advice given to you by your health care provider. Make sure you discuss any questions you have with your health care provider.  

## 2015-03-06 NOTE — ED Notes (Signed)
Pt very sleepy from the ativan..  Still has a headache.  His speech pattern has cleared up.  Normal speech for approx one  hour

## 2015-03-06 NOTE — ED Provider Notes (Signed)
I saw and evaluated the patient, reviewed the resident's note and I agree with the findings and plan.   EKG Interpretation   Date/Time:  Saturday March 05 2015 19:39:30 EDT Ventricular Rate:  116 PR Interval:  141 QRS Duration: 92 QT Interval:  320 QTC Calculation: 444 R Axis:   67 Text Interpretation:  Sinus tachycardia no significant change from last  month Confirmed by Shadai Mcclane  MD, Miangel Flom ([id number]) on 03/05/2015 7:53:49 PM       Patient presents with trouble speaking and is currently intoxicated. Is having a headache as well. The patient's exam shows he is able to get words out but seems to take a long time. There is some mild lip smacking but no tongue deviation or head deviation. He is on multiple psychiatric medicines but none that would typically cause tardive dyskinesia. Plan to consult neurology with likely plan to give benzos and MRI to rule out stroke or other pathology.  Sherwood Gambler, MD 03/06/15 [phone]

## 2015-03-06 NOTE — ED Notes (Signed)
Pt returned from mri

## 2015-03-06 NOTE — ED Notes (Signed)
Sandwich given 

## 2015-03-13 ENCOUNTER — Emergency Department (HOSPITAL_COMMUNITY)
Admission: EM | Admit: 2015-03-13 | Discharge: 2015-03-14 | Disposition: A | Discharge status: Other Acute Inpatient Hospital | Payer: Medicaid Other | Attending: Emergency Medicine | Admitting: Emergency Medicine

## 2015-03-13 ENCOUNTER — Encounter (HOSPITAL_COMMUNITY): Payer: Self-pay | Admitting: Emergency Medicine

## 2015-03-13 DIAGNOSIS — Z720 Tobacco use: Secondary | ICD-10-CM | POA: Diagnosis not present

## 2015-03-13 DIAGNOSIS — F1020 Alcohol dependence, uncomplicated: Secondary | ICD-10-CM | POA: Insufficient documentation

## 2015-03-13 DIAGNOSIS — I10 Essential (primary) hypertension: Secondary | ICD-10-CM | POA: Diagnosis not present

## 2015-03-13 DIAGNOSIS — F332 Major depressive disorder, recurrent severe without psychotic features: Secondary | ICD-10-CM | POA: Diagnosis not present

## 2015-03-13 DIAGNOSIS — Z8673 Personal history of transient ischemic attack (TIA), and cerebral infarction without residual deficits: Secondary | ICD-10-CM | POA: Diagnosis not present

## 2015-03-13 DIAGNOSIS — K219 Gastro-esophageal reflux disease without esophagitis: Secondary | ICD-10-CM | POA: Insufficient documentation

## 2015-03-13 DIAGNOSIS — R45851 Suicidal ideations: Secondary | ICD-10-CM | POA: Diagnosis present

## 2015-03-13 DIAGNOSIS — Z8739 Personal history of other diseases of the musculoskeletal system and connective tissue: Secondary | ICD-10-CM | POA: Insufficient documentation

## 2015-03-13 DIAGNOSIS — J449 Chronic obstructive pulmonary disease, unspecified: Secondary | ICD-10-CM | POA: Insufficient documentation

## 2015-03-13 DIAGNOSIS — Z79899 Other long term (current) drug therapy: Secondary | ICD-10-CM | POA: Diagnosis not present

## 2015-03-13 DIAGNOSIS — Z880 Allergy status to penicillin: Secondary | ICD-10-CM | POA: Diagnosis not present

## 2015-03-13 DIAGNOSIS — F10230 Alcohol dependence with withdrawal, uncomplicated: Secondary | ICD-10-CM

## 2015-03-13 LAB — CBC
HEMATOCRIT: 39 % (ref 39.0–52.0)
Hemoglobin: 13.2 g/dL (ref 13.0–17.0)
MCH: 30.8 pg (ref 26.0–34.0)
MCHC: 33.8 g/dL (ref 30.0–36.0)
MCV: 90.9 fL (ref 78.0–100.0)
PLATELETS: 249 10*3/uL (ref 150–400)
RBC: 4.29 MIL/uL (ref 4.22–5.81)
RDW: 17 % — ABNORMAL HIGH (ref 11.5–15.5)
WBC: 7.7 10*3/uL (ref 4.0–10.5)

## 2015-03-13 LAB — RAPID URINE DRUG SCREEN, HOSP PERFORMED
AMPHETAMINES: NOT DETECTED
BENZODIAZEPINES: NOT DETECTED
Barbiturates: NOT DETECTED
COCAINE: NOT DETECTED
Opiates: NOT DETECTED
TETRAHYDROCANNABINOL: NOT DETECTED

## 2015-03-13 LAB — COMPREHENSIVE METABOLIC PANEL
ALT: 32 U/L (ref 17–63)
ANION GAP: 12 (ref 5–15)
AST: 50 U/L — AB (ref 15–41)
Albumin: 3.6 g/dL (ref 3.5–5.0)
Alkaline Phosphatase: 130 U/L — ABNORMAL HIGH (ref 38–126)
BILIRUBIN TOTAL: 0.5 mg/dL (ref 0.3–1.2)
BUN: 6 mg/dL (ref 6–20)
CO2: 21 mmol/L — AB (ref 22–32)
CREATININE: 0.69 mg/dL (ref 0.61–1.24)
Calcium: 8.3 mg/dL — ABNORMAL LOW (ref 8.9–10.3)
Chloride: 100 mmol/L — ABNORMAL LOW (ref 101–111)
GFR calc Af Amer: >60 mL/min (ref >60)
GFR calc non Af Amer: >60 mL/min (ref >60)
GLUCOSE: 92 mg/dL (ref 65–99)
Potassium: 3.8 mmol/L (ref 3.5–5.1)
Sodium: 133 mmol/L — ABNORMAL LOW (ref 135–145)
TOTAL PROTEIN: 7.2 g/dL (ref 6.5–8.1)

## 2015-03-13 LAB — ETHANOL: ALCOHOL ETHYL (B): 340 mg/dL — AB (ref <5)

## 2015-03-13 MED ORDER — ACETAMINOPHEN 325 MG PO TABS: 650.0000 mg | ORAL_TABLET | ORAL | Status: DC | PRN

## 2015-03-13 MED ORDER — ONDANSETRON HCL 4 MG PO TABS: 4.0000 mg | ORAL_TABLET | Freq: Three times a day (TID) | ORAL | Status: DC | PRN

## 2015-03-13 MED ORDER — ALUM & MAG HYDROXIDE-SIMETH 200-200-20 MG/5ML PO SUSP: 30.0000 mL | ORAL | Status: DC | PRN

## 2015-03-13 MED ORDER — NICOTINE 21 MG/24HR TD PT24
21.0000 mg | MEDICATED_PATCH | Freq: Every day | TRANSDERMAL | Status: DC
  Administered 2015-03-14: 21 mg via TRANSDERMAL
  Filled 2015-03-13: qty 1

## 2015-03-13 MED ORDER — IBUPROFEN 200 MG PO TABS: 600.0000 mg | ORAL_TABLET | Freq: Three times a day (TID) | ORAL | Status: DC | PRN

## 2015-03-13 MED ORDER — ZOLPIDEM TARTRATE 5 MG PO TABS: 5.0000 mg | ORAL_TABLET | Freq: Every evening | ORAL | Status: DC | PRN

## 2015-03-13 MED ORDER — LORAZEPAM 1 MG PO TABS
1.0000 mg | ORAL_TABLET | Freq: Three times a day (TID) | ORAL | Status: DC | PRN
  Administered 2015-03-14: 1 mg via ORAL
  Filled 2015-03-13: qty 1

## 2015-03-13 MED ORDER — ALUM & MAG HYDROXIDE-SIMETH 200-200-20 MG/5ML PO SUSP
30.0000 mL | Freq: Four times a day (QID) | ORAL | Status: DC | PRN
  Administered 2015-03-13: 30 mL via ORAL
  Filled 2015-03-13: qty 30

## 2015-03-13 NOTE — ED Provider Notes (Signed)
CSN: [id number]     Arrival date & time 03/13/15  1936 History   First MD Initiated Contact with Patient 03/13/15 2103     Chief Complaint  Patient presents with  . Suicidal     (Consider location/radiation/quality/duration/timing/severity/associated sxs/prior Treatment) HPI.... Level V caveat for psychiatric disorder. Patient expresses suicidal ideation. He has a long-standing history of alcoholism which has been exacerbated by the death of his son and wife over the years.  He claims to have consumed 7 40 ounce beers today. Past medical history well-documented in note.  Past Medical History  Diagnosis Date  . Cirrhosis   . Degenerative joint disease   . Hypertension   . Gastric ulcer   . Depression   . Asthma   . Mitral valve prolapse 2002  . H/O hiatal hernia   . COPD (chronic obstructive pulmonary disease)   . TIA (transient ischemic attack)     2010  . Stroke     TIA - 2010 - no deficits   . Shortness of breath     with exertion   . GERD (gastroesophageal reflux disease)   . Headache(784.0)   . Hiatal hernia 1982  . Degenerative joint disease   . Anxiety   . Alcohol abuse    Past Surgical History  Procedure Laterality Date  . Gastrectomy    . Shoulder surgery Bilateral     3 surgeries on on left, 2 surgeries on right   . Rt knee arthroscopic surgery    . Back surgery      3 cervical spine surgeries C4-C5 fused  . Hernia repair    . Finger surgery Left     2nd, 3rd, & 4th fingers were cut off by table saw and reattached  . Colonoscopy N/A 01/04/2014    Procedure: COLONOSCOPY;  Surgeon: Danie Binder, MD;  [facility];  Service: Endoscopy;  Laterality: N/A;  1:45  . Esophagogastroduodenoscopy N/A 01/04/2014    Procedure: ESOPHAGOGASTRODUODENOSCOPY (EGD);  Surgeon: Danie Binder, MD;  [facility];  Service: Endoscopy;  Laterality: N/A;  . Incisional hernia repair N/A 01/20/2014    Procedure: LAPAROSCOPIC RECURRENT  INCISIONAL HERNIA with mesh;   Surgeon: Edward Jolly, MD;  Location: [facility];  Service: General;  Laterality: N/A;   Family History  Problem Relation Age of Onset  . Cancer Father     bone  . Cancer Brother     lungs  . Stroke Maternal Grandmother   . Colon cancer Neg Hx   . Asthma Son     died at age 76 in his sleep   . Spina bifida Son     died at age 45    Social History  Substance Use Topics  . Smoking status: Current Every Day Smoker -- 1.00 packs/day for 45 years    Types: Cigarettes    Start date: 07/30/1966  . Smokeless tobacco: Never Used  . Alcohol Use: 3.6 oz/week    6 Cans of beer per week     Comment: 1 case of beer and 1 shot of vodka between 1030am-1430pm    Review of Systems  Unable to perform ROS: Psychiatric disorder      Allergies  Bee venom and Penicillins  Home Medications   Prior to Admission medications   Medication Sig Start Date End Date Taking? Authorizing Provider  acamprosate (CAMPRAL) 333 MG tablet Take 2 tablets (666 mg total) by mouth 3 (three) times daily with meals. For alcoholism  01/06/15  Yes Charlynne Cousins, MD  albuterol (PROVENTIL HFA;VENTOLIN HFA) 108 (90 BASE) MCG/ACT inhaler Inhale 2 puffs into the lungs every 6 (six) hours as needed for wheezing or shortness of breath. 01/06/15  Yes Charlynne Cousins, MD  albuterol-ipratropium (COMBIVENT) 18-103 MCG/ACT inhaler Inhale 2 puffs into the lungs 2 (two) times daily. For shortness of breath 09/22/14  Yes Lorayne Marek, MD  folic acid (FOLVITE) 1 MG tablet Take 1 tablet (1 mg total) by mouth daily. 01/22/15  Yes Albertine Patricia, MD  gabapentin (NEURONTIN) 300 MG capsule Take 1 capsule (300 mg total) by mouth 2 (two) times daily. For substance withdrawal syndrome 01/06/15  Yes Charlynne Cousins, MD  omeprazole (PRILOSEC) 40 MG capsule Take 1 capsule (40 mg total) by mouth daily. 02/07/15  Yes Orson Eva, MD  trazodone (DESYREL) 150 MG tablet Take 2 tablets (300 mg total) by mouth at bedtime. For sleep 08/08/14   Yes Shuvon B Rankin, NP  feeding supplement, ENSURE ENLIVE, (ENSURE ENLIVE) LIQD Take 237 mLs by mouth 2 (two) times daily between meals. Patient not taking: Reported on 03/05/2015 01/22/15   Silver Huguenin Elgergawy, MD  Multiple Vitamin (MULTIVITAMIN WITH MINERALS) TABS tablet Take 1 tablet by mouth daily. Patient not taking: Reported on 03/13/2015 01/22/15   Silver Huguenin Elgergawy, MD   BP 122/78 mmHg  Pulse 108  Temp(Src) 97.5 F (36.4 C) (Oral)  Resp 24  SpO2 99% Physical Exam  Constitutional: He is oriented to person, place, and time.  Patient is intoxicated, but not psychotic  HENT:  Head: Normocephalic and atraumatic.  Eyes: Conjunctivae and EOM are normal. Pupils are equal, round, and reactive to light.  Neck: Normal range of motion. Neck supple.  Cardiovascular: Normal rate and regular rhythm.   Pulmonary/Chest: Effort normal and breath sounds normal.  Abdominal: Soft. Bowel sounds are normal.  Musculoskeletal: Normal range of motion.  Neurological: He is alert and oriented to person, place, and time.  Skin: Skin is warm and dry.  Psychiatric:  Flat affect, depressed  Nursing note and vitals reviewed.   ED Course  Procedures (including critical care time) Labs Review Labs Reviewed  COMPREHENSIVE METABOLIC PANEL - Abnormal; Notable for the following:    Sodium 133 (*)    Chloride 100 (*)    CO2 21 (*)    Calcium 8.3 (*)    AST 50 (*)    Alkaline Phosphatase 130 (*)    All other components within normal limits  ETHANOL - Abnormal; Notable for the following:    Alcohol, Ethyl (B) 340 (*)    All other components within normal limits  CBC - Abnormal; Notable for the following:    RDW 17.0 (*)    All other components within normal limits  URINE RAPID DRUG SCREEN, HOSP PERFORMED    Imaging Review No results found. I, Che Rachal, personally reviewed and evaluated these images and lab results as part of my medical decision-making.   EKG Interpretation None      MDM    Final diagnoses:  Depression  Alcoholism    Patient is alert although his alcohol level is 340. Patient is voluntary. Will obtain behavioral health consultation.    Nat Christen, MD 03/13/15 [phone]

## 2015-03-13 NOTE — ED Notes (Signed)
Pt changing into paper scrubs

## 2015-03-13 NOTE — ED Notes (Signed)
340 alcohol level.  Critical value reported by Lab.  Will discuss with MD and tell RN.

## 2015-03-13 NOTE — ED Notes (Signed)
Pt wanded by security. 

## 2015-03-13 NOTE — ED Notes (Addendum)
Pt brought in by [employer] (pt is voluntary)  in which pt called 911 because "I want to die". PT reports that he doesn't want to live anymore and that he usually cuts wrists in order to kill self. No cuts present. He reports he has drank about 7 40 oz beers today. He usually drinks "a couple 40's". Pt states "I want to kill myself but I'm to chicken" and then makes a farting noise with his mouth. Pt reports that he feels unsafe and that "they want to kill me but they are to chicken". Pt ambulates to restroom with steady gait.

## 2015-03-14 ENCOUNTER — Encounter (HOSPITAL_COMMUNITY): Payer: Self-pay | Admitting: *Deleted

## 2015-03-14 ENCOUNTER — Inpatient Hospital Stay (HOSPITAL_COMMUNITY)
Admission: AD | Admit: 2015-03-14 | Discharge: 2015-03-18 | DRG: 897 | Disposition: A | Discharge status: Home / Self Care | Payer: Medicaid Other | Source: Intra-hospital | Attending: Psychiatry | Admitting: Psychiatry

## 2015-03-14 DIAGNOSIS — M1990 Unspecified osteoarthritis, unspecified site: Secondary | ICD-10-CM | POA: Diagnosis present

## 2015-03-14 DIAGNOSIS — K760 Fatty (change of) liver, not elsewhere classified: Secondary | ICD-10-CM | POA: Diagnosis present

## 2015-03-14 DIAGNOSIS — I10 Essential (primary) hypertension: Secondary | ICD-10-CM | POA: Diagnosis present

## 2015-03-14 DIAGNOSIS — Z823 Family history of stroke: Secondary | ICD-10-CM

## 2015-03-14 DIAGNOSIS — K219 Gastro-esophageal reflux disease without esophagitis: Secondary | ICD-10-CM | POA: Diagnosis present

## 2015-03-14 DIAGNOSIS — F332 Major depressive disorder, recurrent severe without psychotic features: Secondary | ICD-10-CM | POA: Diagnosis present

## 2015-03-14 DIAGNOSIS — F10230 Alcohol dependence with withdrawal, uncomplicated: Secondary | ICD-10-CM | POA: Diagnosis not present

## 2015-03-14 DIAGNOSIS — F410 Panic disorder [episodic paroxysmal anxiety] without agoraphobia: Secondary | ICD-10-CM | POA: Diagnosis present

## 2015-03-14 DIAGNOSIS — K7460 Unspecified cirrhosis of liver: Secondary | ICD-10-CM | POA: Diagnosis present

## 2015-03-14 DIAGNOSIS — G4700 Insomnia, unspecified: Secondary | ICD-10-CM | POA: Diagnosis present

## 2015-03-14 DIAGNOSIS — F17210 Nicotine dependence, cigarettes, uncomplicated: Secondary | ICD-10-CM | POA: Diagnosis present

## 2015-03-14 DIAGNOSIS — F10239 Alcohol dependence with withdrawal, unspecified: Secondary | ICD-10-CM | POA: Diagnosis present

## 2015-03-14 DIAGNOSIS — Z8673 Personal history of transient ischemic attack (TIA), and cerebral infarction without residual deficits: Secondary | ICD-10-CM | POA: Diagnosis not present

## 2015-03-14 DIAGNOSIS — F4310 Post-traumatic stress disorder, unspecified: Secondary | ICD-10-CM | POA: Diagnosis present

## 2015-03-14 DIAGNOSIS — J449 Chronic obstructive pulmonary disease, unspecified: Secondary | ICD-10-CM | POA: Diagnosis present

## 2015-03-14 DIAGNOSIS — R45851 Suicidal ideations: Secondary | ICD-10-CM

## 2015-03-14 MED ORDER — TRAZODONE HCL 50 MG PO TABS
50.0000 mg | ORAL_TABLET | Freq: Every day | ORAL | Status: DC
  Administered 2015-03-14: 50 mg via ORAL
  Filled 2015-03-14: qty 1

## 2015-03-14 MED ORDER — CHLORDIAZEPOXIDE HCL 25 MG PO CAPS: 25.0000 mg | ORAL_CAPSULE | Freq: Every day | ORAL | Status: DC

## 2015-03-14 MED ORDER — LORAZEPAM 1 MG PO TABS: 1.0000 mg | ORAL_TABLET | Freq: Two times a day (BID) | ORAL | Status: DC

## 2015-03-14 MED ORDER — IPRATROPIUM-ALBUTEROL 20-100 MCG/ACT IN AERS
2.0000 | INHALATION_SPRAY | Freq: Two times a day (BID) | RESPIRATORY_TRACT | Status: DC
  Administered 2015-03-14: 2 via RESPIRATORY_TRACT
  Filled 2015-03-14: qty 4

## 2015-03-14 MED ORDER — CHLORDIAZEPOXIDE HCL 25 MG PO CAPS: 25.0000 mg | ORAL_CAPSULE | Freq: Four times a day (QID) | ORAL | Status: DC

## 2015-03-14 MED ORDER — PANTOPRAZOLE SODIUM 40 MG PO TBEC
40.0000 mg | DELAYED_RELEASE_TABLET | Freq: Every day | ORAL | Status: DC
  Administered 2015-03-14: 40 mg via ORAL
  Filled 2015-03-14: qty 1

## 2015-03-14 MED ORDER — ADULT MULTIVITAMIN W/MINERALS CH
1.0000 | ORAL_TABLET | Freq: Every day | ORAL | Status: DC
  Administered 2015-03-14: 1 via ORAL
  Filled 2015-03-14: qty 1

## 2015-03-14 MED ORDER — VITAMIN B-1 100 MG PO TABS
100.0000 mg | ORAL_TABLET | Freq: Every day | ORAL | Status: DC
  Administered 2015-03-14: 100 mg via ORAL
  Filled 2015-03-14: qty 1

## 2015-03-14 MED ORDER — CHLORDIAZEPOXIDE HCL 25 MG PO CAPS
25.0000 mg | ORAL_CAPSULE | ORAL | Status: AC
  Administered 2015-03-17 – 2015-03-18 (×2): 25 mg via ORAL
  Filled 2015-03-14 (×2): qty 1

## 2015-03-14 MED ORDER — ADULT MULTIVITAMIN W/MINERALS CH
1.0000 | ORAL_TABLET | Freq: Every day | ORAL | Status: DC
Start: 2015-03-14 — End: 2015-03-14

## 2015-03-14 MED ORDER — TRAZODONE HCL 100 MG PO TABS
100.0000 mg | ORAL_TABLET | Freq: Once | ORAL | Status: DC
  Filled 2015-03-14: qty 1

## 2015-03-14 MED ORDER — CHLORDIAZEPOXIDE HCL 25 MG PO CAPS: 25.0000 mg | ORAL_CAPSULE | Freq: Four times a day (QID) | ORAL | Status: DC | PRN

## 2015-03-14 MED ORDER — GABAPENTIN 300 MG PO CAPS
300.0000 mg | ORAL_CAPSULE | Freq: Two times a day (BID) | ORAL | Status: DC
  Administered 2015-03-14: 300 mg via ORAL
  Filled 2015-03-14: qty 1

## 2015-03-14 MED ORDER — FOLIC ACID 1 MG PO TABS
1.0000 mg | ORAL_TABLET | Freq: Every day | ORAL | Status: DC
  Administered 2015-03-15 – 2015-03-18 (×4): 1 mg via ORAL
  Filled 2015-03-14 (×6): qty 1

## 2015-03-14 MED ORDER — LORAZEPAM 1 MG PO TABS
1.0000 mg | ORAL_TABLET | Freq: Four times a day (QID) | ORAL | Status: DC
  Administered 2015-03-14: 1 mg via ORAL
  Filled 2015-03-14: qty 1

## 2015-03-14 MED ORDER — ACAMPROSATE CALCIUM 333 MG PO TBEC
666.0000 mg | DELAYED_RELEASE_TABLET | Freq: Three times a day (TID) | ORAL | Status: DC
  Administered 2015-03-14: 666 mg via ORAL
  Filled 2015-03-14 (×2): qty 2

## 2015-03-14 MED ORDER — IPRATROPIUM-ALBUTEROL 18-103 MCG/ACT IN AERO
2.0000 | INHALATION_SPRAY | Freq: Two times a day (BID) | RESPIRATORY_TRACT | Status: DC
  Filled 2015-03-14: qty 14.7

## 2015-03-14 MED ORDER — ENSURE ENLIVE PO LIQD: 237.0000 mL | Freq: Two times a day (BID) | ORAL | Status: DC

## 2015-03-14 MED ORDER — CHLORDIAZEPOXIDE HCL 25 MG PO CAPS: 25.0000 mg | ORAL_CAPSULE | ORAL | Status: DC

## 2015-03-14 MED ORDER — TRAZODONE HCL 150 MG PO TABS: 150.0000 mg | ORAL_TABLET | Freq: Every evening | ORAL | Status: DC | PRN

## 2015-03-14 MED ORDER — ACAMPROSATE CALCIUM 333 MG PO TBEC
666.0000 mg | DELAYED_RELEASE_TABLET | Freq: Three times a day (TID) | ORAL | Status: DC
  Administered 2015-03-14: 666 mg via ORAL
  Filled 2015-03-14 (×3): qty 2

## 2015-03-14 MED ORDER — ACETAMINOPHEN 325 MG PO TABS
650.0000 mg | ORAL_TABLET | Freq: Four times a day (QID) | ORAL | Status: DC | PRN
  Administered 2015-03-14 – 2015-03-18 (×6): 650 mg via ORAL
  Filled 2015-03-14 (×6): qty 2

## 2015-03-14 MED ORDER — CHLORDIAZEPOXIDE HCL 25 MG PO CAPS
25.0000 mg | ORAL_CAPSULE | Freq: Four times a day (QID) | ORAL | Status: AC
  Administered 2015-03-15 – 2015-03-16 (×5): 25 mg via ORAL
  Filled 2015-03-14 (×5): qty 1

## 2015-03-14 MED ORDER — TRAZODONE HCL 100 MG PO TABS: 300.0000 mg | ORAL_TABLET | Freq: Every day | ORAL | Status: DC

## 2015-03-14 MED ORDER — CHLORDIAZEPOXIDE HCL 25 MG PO CAPS: 25.0000 mg | ORAL_CAPSULE | Freq: Three times a day (TID) | ORAL | Status: DC

## 2015-03-14 MED ORDER — FOLIC ACID 1 MG PO TABS
1.0000 mg | ORAL_TABLET | Freq: Every day | ORAL | Status: DC
  Administered 2015-03-14: 1 mg via ORAL
  Filled 2015-03-14: qty 1

## 2015-03-14 MED ORDER — ALUM & MAG HYDROXIDE-SIMETH 200-200-20 MG/5ML PO SUSP: 30.0000 mL | ORAL | Status: DC | PRN

## 2015-03-14 MED ORDER — TRAZODONE HCL 50 MG PO TABS: 50.0000 mg | ORAL_TABLET | Freq: Every day | ORAL | Status: DC

## 2015-03-14 MED ORDER — CHLORDIAZEPOXIDE HCL 25 MG PO CAPS: 25.0000 mg | ORAL_CAPSULE | Freq: Four times a day (QID) | ORAL | Status: AC | PRN

## 2015-03-14 MED ORDER — MAGNESIUM HYDROXIDE 400 MG/5ML PO SUSP: 30.0000 mL | Freq: Every day | ORAL | Status: DC | PRN

## 2015-03-14 MED ORDER — ONDANSETRON 4 MG PO TBDP: 4.0000 mg | ORAL_TABLET | Freq: Four times a day (QID) | ORAL | Status: DC | PRN

## 2015-03-14 MED ORDER — VITAMIN B-1 100 MG PO TABS
100.0000 mg | ORAL_TABLET | Freq: Every day | ORAL | Status: DC
  Administered 2015-03-15 – 2015-03-18 (×4): 100 mg via ORAL
  Filled 2015-03-14 (×6): qty 1

## 2015-03-14 MED ORDER — GABAPENTIN 300 MG PO CAPS
300.0000 mg | ORAL_CAPSULE | Freq: Two times a day (BID) | ORAL | Status: DC
  Administered 2015-03-14 – 2015-03-18 (×8): 300 mg via ORAL
  Filled 2015-03-14 (×3): qty 1
  Filled 2015-03-14 (×2): qty 18
  Filled 2015-03-14 (×7): qty 1

## 2015-03-14 MED ORDER — HYDROXYZINE HCL 25 MG PO TABS: 25.0000 mg | ORAL_TABLET | Freq: Four times a day (QID) | ORAL | Status: AC | PRN

## 2015-03-14 MED ORDER — ADULT MULTIVITAMIN W/MINERALS CH
1.0000 | ORAL_TABLET | Freq: Every day | ORAL | Status: DC
  Administered 2015-03-15 – 2015-03-18 (×4): 1 via ORAL
  Filled 2015-03-14 (×6): qty 1

## 2015-03-14 MED ORDER — LORAZEPAM 1 MG PO TABS: 1.0000 mg | ORAL_TABLET | Freq: Three times a day (TID) | ORAL | Status: DC

## 2015-03-14 MED ORDER — LOPERAMIDE HCL 2 MG PO CAPS: 2.0000 mg | ORAL_CAPSULE | ORAL | Status: AC | PRN

## 2015-03-14 MED ORDER — IPRATROPIUM-ALBUTEROL 20-100 MCG/ACT IN AERS
2.0000 | INHALATION_SPRAY | Freq: Two times a day (BID) | RESPIRATORY_TRACT | Status: DC
  Administered 2015-03-14 – 2015-03-18 (×8): 2 via RESPIRATORY_TRACT
  Filled 2015-03-14: qty 4

## 2015-03-14 MED ORDER — CHLORDIAZEPOXIDE HCL 25 MG PO CAPS
25.0000 mg | ORAL_CAPSULE | Freq: Three times a day (TID) | ORAL | Status: AC
  Administered 2015-03-16 – 2015-03-17 (×3): 25 mg via ORAL
  Filled 2015-03-14 (×3): qty 1

## 2015-03-14 MED ORDER — PANTOPRAZOLE SODIUM 40 MG PO TBEC
40.0000 mg | DELAYED_RELEASE_TABLET | Freq: Every day | ORAL | Status: DC
  Administered 2015-03-15 – 2015-03-18 (×4): 40 mg via ORAL
  Filled 2015-03-14 (×6): qty 1

## 2015-03-14 MED ORDER — THIAMINE HCL 100 MG/ML IJ SOLN
100.0000 mg | Freq: Once | INTRAMUSCULAR | Status: DC
Start: 2015-03-14 — End: 2015-03-14

## 2015-03-14 MED ORDER — LOPERAMIDE HCL 2 MG PO CAPS: 2.0000 mg | ORAL_CAPSULE | ORAL | Status: DC | PRN

## 2015-03-14 MED ORDER — LORAZEPAM 1 MG PO TABS: 1.0000 mg | ORAL_TABLET | Freq: Every day | ORAL | Status: DC

## 2015-03-14 MED ORDER — HYDROXYZINE HCL 25 MG PO TABS: 25.0000 mg | ORAL_TABLET | Freq: Four times a day (QID) | ORAL | Status: DC | PRN

## 2015-03-14 NOTE — Progress Notes (Signed)
Patient is alert and oriented x 4. Patient denies HI, AVH. Patient is having passive SI with no plan by stating, "I just want it to end."  Patient states wife died 3 years ago, and his 2nd son died 6 years ago. He also had an infant son that died in 5945 from complications of spinal bifida.  Patient states he has a daughter he has not seen or talked to in 5 years and his mom is 67 and in bad health. Patient reports he has attempted suicide once before by slitting his wrist 2 years ago. Patient is a smoker 1.5 packs a day for 45 years and wants nicotine patch. Patient has been drinking 4-5 40 oz beers a day for the past 4 or 5 months.  Critical lab reported to writer Alcohol level 340. Patient stated, "I drank 7 40 oz beers before I came here." Patient reports pain medial lower chest area 7/10. Patient reports HX of hiatal hernia.  NP made aware order was placed for Maalox/mylanta 30 ml PRN.  Sandwich and sprite was given to patient, then at 2218 PRN Mylanta was given with effective results. Patient states pain 1/10. Patient is currently resting in bed with eyes closed. Patient is being closely monitored by 15 min checks.

## 2015-03-14 NOTE — BH Assessment (Addendum)
Tele Assessment Note   Samuel Reyes is an 59 y.o.widowed male who was brought into the [facility] by [employer] after he called 911 because pt sts he wanted to kill himself. Information for this assessment obtained from pt and hospital records. Pt sts that he "wants to die."  Pt sts that he is "tired of living."  Pt sts that his depression has been getting worse over the last month until tonight he was contemplating killing himself.  Pt sts he has attempted to kill himself previously once before by slitting his wrists.  Pt sts that he does own 4 guns but his mother has his guns and he does not know what she has done with them. Pt sts that he can't stop thinking about "losing everything."  Pt sts that he has lost 1 son in infantcy, 1 son at 85 yo (died 77 years ago) and 2 wives, the most recent having died 3 years ago.  Pt sts that he has been living with a roommate that he does not get along with.  Pt sts that he has had thoughts of "beating him up" but not of killing him. Pt denies HI, SHI and AVH. Pt denies any form of abuse: physical, sexual or emotional/verbal. Pt sts he drinks alcohol daily.  Pt sts he usually drinks 2-40 oz beers but, today he drank 6-7 40 oz beers and when tested at the hospital tonight, pt's BAL was 340. Pt tested negative for all substances on his UDS. Pt sts that he sleeps about 3-4 hours per night and in the last few months has lost 14 lbs.  Pt sts that he can manage his ADLs but due to his degenerative joint disease his knees do "give out" sometimes and he does fall often..   Pt lives with a roommate in the roommate's house. Pt sts that the roommate is going to lose his house in another month which may make the pt homeless. Pt sts that he is disabled with a degenerative joint disease, cirrhosis, a stomach ulcer and other health condtions and has had 27 operations which depresses him.  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 Pt sts that he was born in [place] and  moved to [place] where he lived for 32 years. Pt sts he moved back to [place] after his wife died 3 years ago. Pt sts he has been IP multiple times in [place] and has been IP 3 times at [facility] since August, 2015. Pt sts he has not had therapy recently and that he stopped going a couple of months ago because "it wasn't helping."  Pt sts he has a hx of panic attacks, anxiety and depression.   Pt was dressed in scrubs and lying in his hospital bed during the assessment. Pt was alert, cooperative and pleasant, although somewhat irritable. Pt spoke and moved in normal manner.  Pt's thought processes were coherent and relevant. Pt's mood was depressed and a bit anxious.  Pt sts he had a panic attack today. Pt's mood was depressed and anxious and his blunted affect was congruent. Pt was oriented x 4.   Axis I: 311 Unspecified Depressive Disorder; Anxiety by hx Axis II: Deferred Axis III:  Past Medical History  Diagnosis Date  . Cirrhosis   . Degenerative joint disease   . Hypertension   . Gastric ulcer   . Depression   . Asthma   . Mitral valve prolapse 2002  . H/O hiatal  hernia   . COPD (chronic obstructive pulmonary disease)   . TIA (transient ischemic attack)     2010  . Stroke     TIA - 2010 - no deficits   . Shortness of breath     with exertion   . GERD (gastroesophageal reflux disease)   . Headache(784.0)   . Hiatal hernia 1982  . Degenerative joint disease   . Anxiety   . Alcohol abuse    Axis IV: housing problems, occupational problems, other psychosocial or environmental problems, problems related to social environment and problems with primary support group Axis V: 11-20 some danger of hurting self or others possible OR occasionally fails to maintain minimal personal hygiene OR gross impairment in communication  Past Medical History:  Past Medical History  Diagnosis Date  . Cirrhosis   . Degenerative joint disease   . Hypertension   . Gastric ulcer   . Depression   . Asthma   .  Mitral valve prolapse 2002  . H/O hiatal hernia   . COPD (chronic obstructive pulmonary disease)   . TIA (transient ischemic attack)     2010  . Stroke     TIA - 2010 - no deficits   . Shortness of breath     with exertion   . GERD (gastroesophageal reflux disease)   . Headache(784.0)   . Hiatal hernia 1982  . Degenerative joint disease   . Anxiety   . Alcohol abuse     Past Surgical History  Procedure Laterality Date  . Gastrectomy    . Shoulder surgery Bilateral     3 surgeries on on left, 2 surgeries on right   . Rt knee arthroscopic surgery    . Back surgery      3 cervical spine surgeries C4-C5 fused  . Hernia repair    . Finger surgery Left     2nd, 3rd, & 4th fingers were cut off by table saw and reattached  . Colonoscopy N/A 01/04/2014    Procedure: COLONOSCOPY;  Surgeon: Danie Binder, MD;  [facility];  Service: Endoscopy;  Laterality: N/A;  1:45  . Esophagogastroduodenoscopy N/A 01/04/2014    Procedure: ESOPHAGOGASTRODUODENOSCOPY (EGD);  Surgeon: Danie Binder, MD;  [facility];  Service: Endoscopy;  Laterality: N/A;  . Incisional hernia repair N/A 01/20/2014    Procedure: LAPAROSCOPIC RECURRENT  INCISIONAL HERNIA with mesh;  Surgeon: Edward Jolly, MD;  Location: [facility];  Service: General;  Laterality: N/A;    Family History:  Family History  Problem Relation Age of Onset  . Cancer Father     bone  . Cancer Brother     lungs  . Stroke Maternal Grandmother   . Colon cancer Neg Hx   . Asthma Son     died at age 30 in his sleep   . Spina bifida Son     died at age 15     Social History:  reports that he has been smoking Cigarettes.  He started smoking about 48 years ago. He has a 45 pack-year smoking history. He has never used smokeless tobacco. He reports that he drinks about 3.6 oz of alcohol per week. He reports that he does not use illicit drugs.  Additional Social History:  Alcohol / Drug Use Prescriptions: See PTA  list History of alcohol / drug use?: Yes Longest period of sobriety (when/how long): "a month" Substance #1 Name of Substance 1: Alcohol 1 - Age of First  Use: 17 1 - Amount (size/oz): "usually 2-40 oz beers, today 6-7 40 oz beers" 1 - Frequency: daily 1 - Duration: "years" 1 - Last Use / Amount: today 03/13/15  CIWA: CIWA-Ar BP: 122/78 mmHg Pulse Rate: 108 Nausea and Vomiting: no nausea and no vomiting Tactile Disturbances: none Tremor: not visible, but can be felt fingertip to fingertip Auditory Disturbances: not present Paroxysmal Sweats: no sweat visible Visual Disturbances: not present Anxiety: no anxiety, at ease Headache, Fullness in Head: none present Agitation: normal activity Orientation and Clouding of Sensorium: oriented and can do serial additions CIWA-Ar Total: 1 COWS:    PATIENT STRENGTHS: (choose at least two) Average or above average intelligence Capable of independent living Communication skills Supportive family/friends  Allergies:  Allergies  Allergen Reactions  . Bee Venom Anaphylaxis  . Penicillins Rash    Home Medications:  (Not in a hospital admission)  OB/GYN Status:  No LMP for male patient.  General Assessment Data [facility]: [facility] Assessment: In system Is this a Tele or Face-to-Face Assessment?: Tele Assessment Is this an Initial Assessment or a Re-assessment for this encounter?: Initial Assessment Marital status: Widowed [place] name: na Is patient pregnant?: No Pregnancy Status: No Living Arrangements: Non-relatives/Friends Can pt return to current living arrangement?: Yes Admission Status: Voluntary Is patient capable of signing voluntary admission?: Yes Referral Source: Self/Family/Friend Insurance type: Medicaid  Medical Screening Exam ([place]) Medical Exam completed: Yes  Crisis Care Plan Living Arrangements: Non-relatives/Friends Name of Psychiatrist: [place] Name  of Therapist: none  Education Status Is patient currently in school?: No Current Grade: na Highest grade of school patient has completed: 7 Name of school: na Contact person: na  Risk to self with the past 6 months Suicidal Ideation: Yes-Currently Present Has patient been a risk to self within the past 6 months prior to admission? : Yes Suicidal Intent: Yes-Currently Present Has patient had any suicidal intent within the past 6 months prior to admission? : Yes Is patient at risk for suicide?: Yes Suicidal Plan?: Yes-Currently Present Has patient had any suicidal plan within the past 6 months prior to admission? : Yes Specify Current Suicidal Plan: slash his wrists Access to Means: Yes Specify Access to Suicidal Means: sharp objects What has been your use of drugs/alcohol within the last 12 months?: daily alcohol use Previous Attempts/Gestures: Yes How many times?: 1 Other Self Harm Risks: none noted Triggers for Past Attempts:  (death of children and spouses) Intentional Self Injurious Behavior: None Family Suicide History: Unknown Recent stressful life event(s): Conflict (Comment) (conflict with roommate; rumination on losses) Persecutory voices/beliefs?: No Depression: Yes Depression Symptoms: Despondent, Insomnia, Tearfulness, Isolating, Fatigue, Guilt, Loss of interest in usual pleasures, Feeling worthless/self pity, Feeling angry/irritable Substance abuse history and/or treatment for substance abuse?: Yes Suicide prevention information given to non-admitted patients: Not applicable  Risk to Others within the past 6 months Homicidal Ideation: No (denies) Does patient have any lifetime risk of violence toward others beyond the six months prior to admission? : No Thoughts of Harm to Others: Yes-Currently Present (thoughts of "beating up" his roommate) Comment - Thoughts of Harm to Others: thoughts of "beating up" his roommate Current Homicidal Intent: No (denies) Current  Homicidal Plan: No (denies) Access to Homicidal Means: No Identified Victim: his roommate History of harm to others?: No (denies) Assessment of Violence: None Noted Violent Behavior Description: na Does patient have access to weapons?: No (sts he owns 4 guns but sts his  mother has them secured) Criminal Charges Pending?: No Does patient have a court date: No Is patient on probation?: No  Psychosis Hallucinations: None noted Delusions: None noted  Mental Status Report Appearance/Hygiene: Disheveled, In scrubs Eye Contact: Fair Motor Activity: Restlessness, Freedom of movement Speech: Loud, Logical/coherent Level of Consciousness: Alert, Restless Mood: Depressed, Pleasant, Irritable Affect: Depressed, Blunted Anxiety Level: Minimal Thought Processes: Coherent, Relevant Judgement: Impaired Orientation: Person, Place, Time, Situation Obsessive Compulsive Thoughts/Behaviors: None  Cognitive Functioning Concentration: Fair Memory: Recent Intact, Remote Intact IQ: Average Insight: Poor Impulse Control: Poor Appetite: Poor Weight Loss: 14 Weight Gain: 0 Sleep: Decreased Total Hours of Sleep: 4 (3-4) Vegetative Symptoms: Staying in bed, Decreased grooming  ADLScreening [facility] Assessment Services) Patient's cognitive ability adequate to safely complete daily activities?: Yes Patient able to express need for assistance with ADLs?: Yes Independently performs ADLs?: Yes (appropriate for developmental age)  Prior Inpatient Therapy Prior Inpatient Therapy: Yes Prior Therapy Dates: multiple Prior Therapy Facilty/Provider(s): multiple in [place] Reason for Treatment: Depression, SI  Prior Outpatient Therapy Prior Outpatient Therapy: Yes Prior Therapy Dates: multiple Prior Therapy Facilty/Provider(s): multiple in [place] Reason for Treatment: Depression, SI Does patient have an ACCT team?: No Does patient have Intensive In-House Services?  : No Does patient have [facility] services? :  No Does patient have P4CC services?: No  ADL Screening (condition at time of admission) Patient's cognitive ability adequate to safely complete daily activities?: Yes Patient able to express need for assistance with ADLs?: Yes Independently performs ADLs?: Yes (appropriate for developmental age)       Abuse/Neglect Assessment (Assessment to be complete while patient is alone) Physical Abuse: Denies Verbal Abuse: Denies Sexual Abuse: Denies Exploitation of patient/patient's resources: Denies Self-Neglect: Denies     Regulatory affairs officer (For Healthcare) Does patient have an advance directive?: Yes Would patient like information on creating an advanced directive?: No - patient declined information Type of Advance Directive: Living will Does patient want to make changes to advanced directive?: No - Patient declined Copy of advanced directive(s) in chart?: No - copy requested    Additional Information 1:1 In Past 12 Months?: No CIRT Risk: No Elopement Risk: No Does patient have medical clearance?: Yes     Disposition:  Disposition Initial Assessment Completed for this Encounter: Yes Disposition of Patient: Other dispositions (Pending review w [facility]) Other disposition(s): Other (Comment)  Per Arlester Marker, NP: Meets IP criteira with Dual Dx (MDD and SA)  Per Lavell Luster, AC: No appropriate bed available at [facility]; TTS to seek outside placement.  Spoke with Dr. Tomi Bamberger at [facility] of recommendation.  She agreed.  Faylene Kurtz, MS, CRC, [place] Triage Specialist [facility] T 03/14/2015 12:31 AM

## 2015-03-14 NOTE — Consult Note (Signed)
[facility] Consult   Reason for Consult:  Suicidal ideations Referring Physician:  EDP Patient Identification: Samuel Reyes MRN:  [id number] Principal Diagnosis: Major depressive disorder, recurrent, severe without psychotic features Diagnosis:   Patient Active Problem List   Diagnosis Date Noted  . Major depressive disorder, recurrent, severe without psychotic features [F33.2]     Priority: High  . Alcohol dependence with uncomplicated withdrawal [N81.771]     Priority: High  . Hypochloremia [E87.8]   . Epigastric pain [R10.13]   . Hepatic steatosis [K76.0] 02/05/2015  . Abdominal pain [R10.9] 02/05/2015  . Pressure ulcer [L89.90] 02/05/2015  . Protein-calorie malnutrition, severe [E43] 01/19/2015  . Seizures [R56.9] 01/19/2015  . New onset seizure [R56.9]   . Chest pain [R07.9] 01/05/2015  . Suicidal ideation [R45.851] 01/05/2015  . GERD without esophagitis [K21.9] 01/05/2015  . Persistent vomiting [R11.10] 08/31/2014  . HCAP (healthcare-associated pneumonia) [J18.9] 08/31/2014  . Hypokalemia [E87.6] 08/31/2014  . Hyponatremia [E87.1] 08/31/2014  . Suicidal ideations [R45.851] 07/21/2014  . Drug overdose [T50.901A] 07/21/2014  . Fever [R50.9]   . Overdose [T50.901A] 07/20/2014  . S/P alcohol detoxification [Z09] 04/15/2014  . Alcohol dependence [F10.20] 03/09/2014  . Major depression, recurrent [F33.9] 03/09/2014  . PTSD (post-traumatic stress disorder) [F43.10] 03/09/2014  . Recurrent ventral incisional hernia [K43.2] 01/20/2014  . Incisional hernia, without obstruction or gangrene [K43.2] 11/26/2013  . Colon polyps [K63.5] 11/26/2013  . DJD (degenerative joint disease) [M19.90] 10/28/2013  . Smoking [Z72.0] 10/28/2013  . Knee pain [M25.569] 06/15/2013  . Essential hypertension [I10] 02/17/2013  . FH: lung cancer [Z80.1] 02/17/2013  . COPD (chronic obstructive pulmonary disease) [J44.9] 02/17/2013  . Smoker [Z72.0] 02/17/2013  . GERD  (gastroesophageal reflux disease) [K21.9] 02/17/2013  . Hiatal hernia [K44.9] 02/17/2013  . Bereavement [Z63.4] 02/02/2013    Total Time spent with patient: 45 minutes  Subjective:   Samuel Reyes is a 59 y.o. male patient admitted with suicidal ideations and plan to shoot himself.  HPI:  59 y.o.widowed male who was brought into the [facility] by [employer] after he called 911 because pt sts he wanted to kill himself. Information for this assessment obtained from pt and hospital records. Pt sts that he "wants to die." Pt sts that he is "tired of living." Pt sts that his depression has been getting worse over the last month until tonight he was contemplating killing himself. Pt sts he has attempted to kill himself previously once before by slitting his wrists. Pt sts that he does own 4 guns but his mother has his guns and he does not know what she has done with them. Pt sts that he can't stop thinking about "losing everything." Pt sts that he has lost 1 son in infantcy, 1 son at 88 yo (died 10 years ago) and 2 wives, the most recent having died 3 years ago. Pt sts that he has been living with a roommate that he does not get along with. Pt sts that he has had thoughts of "beating him up" but not of killing him. Pt denies HI, SHI and AVH. Pt denies any form of abuse: physical, sexual or emotional/verbal. Pt sts he drinks alcohol daily. Pt sts he usually drinks 2-40 oz beers but, today he drank 6-7 40 oz beers and when tested at the hospital tonight, pt's BAL was 340. Pt tested negative for all substances on his UDS. Pt sts that he sleeps about 3-4 hours per night and in the last few months has lost 14 lbs. Pt  sts that he can manage his ADLs but due to his degenerative joint disease his knees do "give out" sometimes and he does fall often..   Pt lives with a roommate in the roommate's house. Pt sts that the roommate is going to lose his house in another month which may make the pt homeless. Pt sts that he is  disabled with a degenerative joint disease, cirrhosis, a stomach ulcer and other health condtions and has had 27 operations which depresses him. 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 Pt sts that he was born in [place] and moved to [place] where he lived for 32 years. Pt sts he moved back to [place] after his wife died 3 years ago. Pt sts he has been IP multiple times in [place] and has been IP 3 times at [facility] since August, 2015. Pt sts he has not had therapy recently and that he stopped going a couple of months ago because "it wasn't helping." Pt sts he has a hx of panic attacks, anxiety and depression.   Today:  The patient remains suicidal with a plan to shoot himself, "tired of living."  Alcohol daily for the past 5 months in large amounts due to his grief for his wife and son.  Denies drug issues and homicidal ideations.  HPI Elements:   Location:  generalized. Quality:  acute. Severity:  severe. Timing:  constant. Duration:  couple of months. Context:  stressors.  Past Medical History:  Past Medical History  Diagnosis Date  . Cirrhosis   . Degenerative joint disease   . Hypertension   . Gastric ulcer   . Depression   . Asthma   . Mitral valve prolapse 2002  . H/O hiatal hernia   . COPD (chronic obstructive pulmonary disease)   . TIA (transient ischemic attack)     2010  . Stroke     TIA - 2010 - no deficits   . Shortness of breath     with exertion   . GERD (gastroesophageal reflux disease)   . Headache(784.0)   . Hiatal hernia 1982  . Degenerative joint disease   . Anxiety   . Alcohol abuse     Past Surgical History  Procedure Laterality Date  . Gastrectomy    . Shoulder surgery Bilateral     3 surgeries on on left, 2 surgeries on right   . Rt knee arthroscopic surgery    . Back surgery      3 cervical spine surgeries C4-C5 fused  . Hernia repair    . Finger surgery Left     2nd, 3rd, & 4th fingers were cut off by table saw and reattached   . Colonoscopy N/A 01/04/2014    Procedure: COLONOSCOPY;  Surgeon: Danie Binder, MD;  Location: [facility];  Service: Endoscopy;  Laterality: N/A;  1:45  . Esophagogastroduodenoscopy N/A 01/04/2014    Procedure: ESOPHAGOGASTRODUODENOSCOPY (EGD);  Surgeon: Danie Binder, MD;  Location: [facility];  Service: Endoscopy;  Laterality: N/A;  . Incisional hernia repair N/A 01/20/2014    Procedure: LAPAROSCOPIC RECURRENT  INCISIONAL HERNIA with mesh;  Surgeon: Edward Jolly, MD;  Location: [facility];  Service: General;  Laterality: N/A;   Family History:  Family History  Problem Relation Age of Onset  . Cancer Father     bone  . Cancer Brother     lungs  . Stroke Maternal Grandmother   . Colon cancer Neg  Hx   . Asthma Son     died at age 38 in his sleep   . Spina bifida Son     died at age 32    Social History:  History  Alcohol Use  . 3.6 oz/week  . 6 Cans of beer per week    Comment: 1 case of beer and 1 shot of vodka between 1030am-1430pm     History  Drug Use No    Comment: denid using any drugs    Social History   Social History  . Marital Status: Widowed    Spouse Name: N/A  . Number of Children: N/A  . Years of Education: N/A   Occupational History  . Disability    Social History Main Topics  . Smoking status: Current Every Day Smoker -- 1.00 packs/day for 45 years    Types: Cigarettes    Start date: 07/30/1966  . Smokeless tobacco: Never Used  . Alcohol Use: 3.6 oz/week    6 Cans of beer per week     Comment: 1 case of beer and 1 shot of vodka between 1030am-1430pm  . Drug Use: No     Comment: denid using any drugs  . Sexual Activity: Not Currently   Other Topics Concern  . None   Social History Narrative   Additional Social History:    Prescriptions: See PTA list History of alcohol / drug use?: Yes Longest period of sobriety (when/how long): "a month" Name of Substance 1: Alcohol 1 - Age of First Use: 17 1 - Amount (size/oz): "usually 2-40 oz  beers, today 6-7 40 oz beers" 1 - Frequency: daily 1 - Duration: "years" 1 - Last Use / Amount: today 03/13/15                   Allergies:   Allergies  Allergen Reactions  . Bee Venom Anaphylaxis  . Penicillins Rash    Labs:  Results for orders placed or performed during the hospital encounter of 03/13/15 (from the past 48 hour(s))  Comprehensive metabolic panel     Status: Abnormal   Collection Time: 03/13/15  8:20 PM  Result Value Ref Range   Sodium 133 (L) 135 - 145 mmol/L   Potassium 3.8 3.5 - 5.1 mmol/L   Chloride 100 (L) 101 - 111 mmol/L   CO2 21 (L) 22 - 32 mmol/L   Glucose, Bld 92 65 - 99 mg/dL   BUN 6 6 - 20 mg/dL   Creatinine, Ser 0.69 0.61 - 1.24 mg/dL   Calcium 8.3 (L) 8.9 - 10.3 mg/dL   Total Protein 7.2 6.5 - 8.1 g/dL   Albumin 3.6 3.5 - 5.0 g/dL   AST 50 (H) 15 - 41 U/L   ALT 32 17 - 63 U/L   Alkaline Phosphatase 130 (H) 38 - 126 U/L   Total Bilirubin 0.5 0.3 - 1.2 mg/dL   GFR calc non Af Amer >60 >60 mL/min   GFR calc Af Amer >60 >60 mL/min    Comment: (NOTE) The eGFR has been calculated using the CKD EPI equation. This calculation has not been validated in all clinical situations. eGFR's persistently <60 mL/min signify possible Chronic Kidney Disease.    Anion gap 12 5 - 15  Ethanol (ETOH)     Status: Abnormal   Collection Time: 03/13/15  8:20 PM  Result Value Ref Range   Alcohol, Ethyl (B) 340 (HH) <5 mg/dL    Comment: CRITICAL RESULT CALLED TO, READ BACK  BY AND VERIFIED WITH: L FANT RN @ 2111 ON 03/13/15 BY C DAVIS        LOWEST DETECTABLE LIMIT FOR SERUM ALCOHOL IS 5 mg/dL FOR MEDICAL PURPOSES ONLY   CBC     Status: Abnormal   Collection Time: 03/13/15  8:20 PM  Result Value Ref Range   WBC 7.7 4.0 - 10.5 K/uL   RBC 4.29 4.22 - 5.81 MIL/uL   Hemoglobin 13.2 13.0 - 17.0 g/dL   HCT 39.0 39.0 - 52.0 %   MCV 90.9 78.0 - 100.0 fL   MCH 30.8 26.0 - 34.0 pg   MCHC 33.8 30.0 - 36.0 g/dL   RDW 17.0 (H) 11.5 - 15.5 %   Platelets 249 150  - 400 K/uL  Urine rapid drug screen (hosp performed) (Not at [facility])     Status: None   Collection Time: 03/13/15  8:50 PM  Result Value Ref Range   Opiates NONE DETECTED NONE DETECTED   Cocaine NONE DETECTED NONE DETECTED   Benzodiazepines NONE DETECTED NONE DETECTED   Amphetamines NONE DETECTED NONE DETECTED   Tetrahydrocannabinol NONE DETECTED NONE DETECTED   Barbiturates NONE DETECTED NONE DETECTED    Comment:        DRUG SCREEN FOR MEDICAL PURPOSES ONLY.  IF CONFIRMATION IS NEEDED FOR ANY PURPOSE, NOTIFY LAB WITHIN 5 DAYS.        LOWEST DETECTABLE LIMITS FOR URINE DRUG SCREEN Drug Class       Cutoff (ng/mL) Amphetamine      1000 Barbiturate      200 Benzodiazepine   382 Tricyclics       505 Opiates          300 Cocaine          300 THC              50     Vitals: Blood pressure 147/87, pulse 105, temperature 98.6 F (37 C), temperature source Oral, resp. rate 18, SpO2 100 %.  Risk to Self: Suicidal Ideation: Yes-Currently Present Suicidal Intent: Yes-Currently Present Is patient at risk for suicide?: Yes Suicidal Plan?: Yes-Currently Present Specify Current Suicidal Plan: slash his wrists Access to Means: Yes Specify Access to Suicidal Means: sharp objects What has been your use of drugs/alcohol within the last 12 months?: daily alcohol use How many times?: 1 Other Self Harm Risks: none noted Triggers for Past Attempts:  (death of children and spouses) Intentional Self Injurious Behavior: None Risk to Others: Homicidal Ideation: No (denies) Thoughts of Harm to Others: Yes-Currently Present (thoughts of "beating up" his roommate) Comment - Thoughts of Harm to Others: thoughts of "beating up" his roommate Current Homicidal Intent: No (denies) Current Homicidal Plan: No (denies) Access to Homicidal Means: No Identified Victim: his roommate History of harm to others?: No (denies) Assessment of Violence: None Noted Violent Behavior Description: na Does patient have  access to weapons?: No (sts he owns 4 guns but sts his mother has them secured) Criminal Charges Pending?: No Does patient have a court date: No Prior Inpatient Therapy: Prior Inpatient Therapy: Yes Prior Therapy Dates: multiple Prior Therapy Facilty/Provider(s): multiple in [place] Reason for Treatment: Depression, SI Prior Outpatient Therapy: Prior Outpatient Therapy: Yes Prior Therapy Dates: multiple Prior Therapy Facilty/Provider(s): multiple in [place] Reason for Treatment: Depression, SI Does patient have an ACCT team?: No Does patient have Intensive In-House Services?  : No Does patient have [facility] services? : No Does patient have P4CC services?: No  Current Facility-Administered Medications  Medication Dose Route Frequency Provider Last Rate Last Dose  . acamprosate (CAMPRAL) tablet 666 mg  666 mg Oral TID WC Hildreth Robart   666 mg at 03/14/15 1303  . alum & mag hydroxide-simeth (MAALOX/MYLANTA) 200-200-20 MG/5ML suspension 30 mL  30 mL Oral PRN Nat Christen, MD      . folic acid (FOLVITE) tablet 1 mg  1 mg Oral Daily Trebor Galdamez   1 mg at 03/14/15 1302  . gabapentin (NEURONTIN) capsule 300 mg  300 mg Oral BID Chanta Bauers   300 mg at 03/14/15 1302  . hydrOXYzine (ATARAX/VISTARIL) tablet 25 mg  25 mg Oral Q6H PRN Zalaya Astarita      . ibuprofen (ADVIL,MOTRIN) tablet 600 mg  600 mg Oral Q8H PRN Nat Christen, MD      . Ipratropium-Albuterol (COMBIVENT) respimat 2 puff  2 puff Inhalation BID Royetta Asal, RPH   2 puff at 03/14/15 1210  . loperamide (IMODIUM) capsule 2-4 mg  2-4 mg Oral PRN Tamika Nou      . LORazepam (ATIVAN) tablet 1 mg  1 mg Oral Q8H PRN Nat Christen, MD   1 mg at 03/14/15 0900  . LORazepam (ATIVAN) tablet 1 mg  1 mg Oral QID Patrecia Pour, NP       Followed by  . [START ON 03/15/2015] LORazepam (ATIVAN) tablet 1 mg  1 mg Oral TID Patrecia Pour, NP       Followed by  . [START ON 03/16/2015] LORazepam (ATIVAN) tablet 1 mg  1 mg Oral BID Patrecia Pour, NP        Followed by  . [START ON 03/18/2015] LORazepam (ATIVAN) tablet 1 mg  1 mg Oral Daily Patrecia Pour, NP      . multivitamin with minerals tablet 1 tablet  1 tablet Oral Daily Jamarii Banks   1 tablet at 03/14/15 1303  . nicotine (NICODERM CQ - dosed in mg/24 hours) patch 21 mg  21 mg Transdermal Daily Nat Christen, MD   21 mg at 03/14/15 1020  . ondansetron (ZOFRAN) tablet 4 mg  4 mg Oral Q8H PRN Nat Christen, MD      . pantoprazole (PROTONIX) EC tablet 40 mg  40 mg Oral Daily Teniyah Seivert   40 mg at 03/14/15 1302  . thiamine (B-1) injection 100 mg  100 mg Intramuscular Once Abigaile Rossie   100 mg at 03/14/15 1310  . [START ON 03/15/2015] thiamine (VITAMIN B-1) tablet 100 mg  100 mg Oral Daily Naftoli Penny   100 mg at 03/14/15 1301  . traZODone (DESYREL) tablet 50 mg  50 mg Oral QHS Marcy Sookdeo       Current Outpatient Prescriptions  Medication Sig Dispense Refill  . acamprosate (CAMPRAL) 333 MG tablet Take 2 tablets (666 mg total) by mouth 3 (three) times daily with meals. For alcoholism 60 tablet 0  . albuterol (PROVENTIL HFA;VENTOLIN HFA) 108 (90 BASE) MCG/ACT inhaler Inhale 2 puffs into the lungs every 6 (six) hours as needed for wheezing or shortness of breath. 1 Inhaler 5  . albuterol-ipratropium (COMBIVENT) 18-103 MCG/ACT inhaler Inhale 2 puffs into the lungs 2 (two) times daily. For shortness of breath 1 Inhaler 5  . folic acid (FOLVITE) 1 MG tablet Take 1 tablet (1 mg total) by mouth daily. 30 tablet 0  . gabapentin (NEURONTIN) 300 MG capsule Take 1 capsule (300 mg total) by mouth 2 (two) times daily. For substance withdrawal syndrome 60 capsule 0  . omeprazole (PRILOSEC)  40 MG capsule Take 1 capsule (40 mg total) by mouth daily. 60 capsule 1  . trazodone (DESYREL) 150 MG tablet Take 2 tablets (300 mg total) by mouth at bedtime. For sleep 60 tablet 0  . feeding supplement, ENSURE ENLIVE, (ENSURE ENLIVE) LIQD Take 237 mLs by mouth 2 (two) times daily between meals. (Patient  not taking: Reported on 03/05/2015) 237 mL 0  . Multiple Vitamin (MULTIVITAMIN WITH MINERALS) TABS tablet Take 1 tablet by mouth daily. (Patient not taking: Reported on 03/13/2015) 30 tablet 0    Musculoskeletal: Strength & Muscle Tone: within normal limits Gait & Station: normal Patient leans: N/A  Psychiatric Specialty Exam: Physical Exam  ROS  Blood pressure 147/87, pulse 105, temperature 98.6 F (37 C), temperature source Oral, resp. rate 18, SpO2 100 %.There is no weight on file to calculate BMI.  General Appearance: Disheveled  Eye Sport and exercise psychologist::  Fair  Speech:  Normal Rate  Volume:  Decreased  Mood:  Depressed  Affect:  Congruent  Thought Process:  Coherent  Orientation:  Full (Time, Place, and Person)  Thought Content:  Rumination  Suicidal Thoughts:  Yes with plan and intent  Homicidal Thoughts:  No  Memory:  Immediate;   Fair Recent;   Fair Remote;   Fair  Judgement:  Poor  Insight:  Fair  Psychomotor Activity:  Decreased  Concentration:  Fair  Recall:  [employer] of Knowledge:Fair  Language: Good  Akathisia:  No  Handed:  Right  AIMS (if indicated):     Assets:  Housing Leisure Time Resilience  ADL's:  Intact  Cognition: WNL  Sleep:      Medical Decision Making: Review of Psycho-Social Stressors (1), Review or order clinical lab tests (1) and Review of Medication Regimen & Side Effects (2)  Treatment Plan Summary: Daily contact with patient to assess and evaluate symptoms and progress in treatment, Medication management and Plan Major Depressive disorder, recurrent, severe without psychotic features: -Crisis stabilization -Restart home medications:  Gabapentin 300 mg BID for anxiety and stability  Alcohol dependence, severe, with uncomplicated withdrawal: -Ativan alcohol detox protocol in place -Camprall 666 mg TID for alcohol cravings -Substance abuse counseling  Plan:  Recommend psychiatric Inpatient admission when medically cleared. Disposition: Admit  to [name] for stabilization  Waylan Boga, [place] 03/14/2015 1:52 PM Patient seen face-to-face for psychiatric evaluation, chart reviewed and case discussed with the physician extender and developed treatment plan. Reviewed the information documented and agree with the treatment plan. Corena Pilgrim, MD

## 2015-03-14 NOTE — BH Assessment (Signed)
[facility] Assessment Progress Note  Per Mojeed Akintayo.MD, this pt requires psychiatric hospitalization at this time.  Debarah Crape, RN, [facility] has assigned pt to [facility] Rm 306-1.  Pt has signed Voluntary Admission and Consent for Treatment, as well as Consent to Release Information, and signed forms have been faxed to [facility].  Pt's nurse, Gerrit Friends, has been notified, and agrees to send original paperwork along with pt via [facility]ries, and to call report to [phone].  Jalene Mullet, [place] Triage Specialist [phone]

## 2015-03-14 NOTE — Tx Team (Signed)
Initial Interdisciplinary Treatment Plan   PATIENT STRESSORS: Financial difficulties Health problems Substance abuse Traumatic event   PATIENT STRENGTHS: Average or above average intelligence Communication skills   PROBLEM LIST: Problem List/Patient Goals Date to be addressed Date deferred Reason deferred Estimated date of resolution  Suicidal ideation 03/15/2015     Unresolved grief  03/15/2015     Homelessness 03/15/2015     "I need to deal with grief of my family dying" 03/15/2015     "stay away from alcohol 03/15/2015                              DISCHARGE CRITERIA:  Improved stabilization in mood, thinking, and/or behavior Need for constant or close observation no longer present Reduction of life-threatening or endangering symptoms to within safe limits Withdrawal symptoms are absent or subacute and managed without 24-hour nursing intervention  PRELIMINARY DISCHARGE PLAN: Attend 12-step recovery group Placement in alternative living arrangements  PATIENT/FAMIILY INVOLVEMENT: This treatment plan has been presented to and reviewed with the patient, Samuel Reyes.  The patient and family have been given the opportunity to ask questions and make suggestions.  [name] 03/14/2015, 3:19 PM

## 2015-03-14 NOTE — ED Notes (Signed)
Discharged to [facility].  Left the unit ambulatory with [employer].  All belongings given to the driver.

## 2015-03-14 NOTE — ED Provider Notes (Signed)
[name], TSS, states patient meets criteria for admission, but no beds at this time at [facility]. Will look for placement.   Rolland Porter, MD 03/14/15 0110
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
## 2015-03-14 NOTE — [facility] Group Notes (Signed)
Adult Psychoeducational Group Note  Date:  03/14/2015 Time:  9:40 PM  Group Topic/Focus:  AA Meeting  Participation Level:  None  Participation Quality:  Attentive  Affect:  Flat  Cognitive:  Alert  Insight: None  Engagement in Group:  None  Modes of Intervention:  Discussion  Additional Comments:  Patient attended group and was attentive.  [name] A 03/14/2015, 9:40 PM

## 2015-03-15 ENCOUNTER — Encounter (HOSPITAL_COMMUNITY): Payer: Self-pay | Admitting: Psychiatry

## 2015-03-15 DIAGNOSIS — F10230 Alcohol dependence with withdrawal, uncomplicated: Secondary | ICD-10-CM

## 2015-03-15 MED ORDER — TRAZODONE HCL 150 MG PO TABS
300.0000 mg | ORAL_TABLET | Freq: Every day | ORAL | Status: DC
  Administered 2015-03-15 – 2015-03-17 (×3): 300 mg via ORAL
  Filled 2015-03-15 (×5): qty 2

## 2015-03-15 MED ORDER — NICOTINE 21 MG/24HR TD PT24
21.0000 mg | MEDICATED_PATCH | Freq: Every day | TRANSDERMAL | Status: DC
  Administered 2015-03-15 – 2015-03-18 (×4): 21 mg via TRANSDERMAL
  Filled 2015-03-15 (×3): qty 1
  Filled 2015-03-15: qty 14
  Filled 2015-03-15 (×3): qty 1

## 2015-03-15 MED ORDER — METOPROLOL TARTRATE 25 MG PO TABS
25.0000 mg | ORAL_TABLET | Freq: Every day | ORAL | Status: DC
  Administered 2015-03-15 – 2015-03-18 (×4): 25 mg via ORAL
  Filled 2015-03-15 (×6): qty 1
  Filled 2015-03-15: qty 6
  Filled 2015-03-15: qty 1

## 2015-03-15 NOTE — Progress Notes (Signed)
D: When asked the circumstances surrounding his adm, pt stated, "I couldn't take it no more. I go to sleep like I'm still on my son. Wake up and start drinking". Pt states he drinks to numb the pain about his losses. Stated his older son died due to an asthma attack and pt was attempting to do CPR. Stated his wife died 3 yrs ago and another son died in infancy due to spina bifida.  Pt states that "talking to people about his family helps him".   A:  Support and encouragement was offered. 15 min checks continued for safety.  R: Pt remains safe.

## 2015-03-15 NOTE — Progress Notes (Signed)
Recreation Therapy Notes  Animal-Assisted Activity (AAA) Program Checklist/Progress Notes Patient Eligibility Criteria Checklist & Daily Group note for Rec Tx Intervention  Date: 08.16.2016 Time: 2:45pm Location: [address]    AAA/T Program Assumption of Risk Form signed by Patient/ or Parent Legal Guardian yes  Patient is free of allergies or sever asthma yes  Patient reports no fear of animals yes  Patient reports no history of cruelty to animals yes  Patient understands his/her participation is voluntary yes  Behavioral Response: Did not attend.    Laureen Ochs Aman Batley, LRT/CTRS        Abrial Arrighi L 03/15/2015 3:15 PM

## 2015-03-15 NOTE — Progress Notes (Signed)
D: Patient is visible on the milieu.  He is interacting well with staff and peers.  Patient states his SI has decreased.  He continues to have withdrawal symptoms such as tremors, chilling and cravings.  Patient is now on the librium protocol.  He is attending groups and participating in his treatment.  He rates his depression as a 7; hopelessness as a 4 and anxiety as a 5.   A: Continue to monitor medication management and MD orders.  Safety checks completed every 15 minutes per protocol.  Offer support and encouragement as needed. R: Patient's behavior is appropriate to situation.

## 2015-03-15 NOTE — H&P (Signed)
Psychiatric Admission Assessment Adult  Patient Identification: Samuel Reyes MRN:  [id number] Date of Evaluation:  03/15/2015 Chief Complaint:  MDD ETOH USE DISORDER Principal Diagnosis: <principal problem not specified> Diagnosis:   Patient Active Problem List   Diagnosis Date Noted  . Recurrent major depression-severe [F33.2] 03/14/2015  . Hypochloremia [E87.8]   . Epigastric pain [R10.13]   . Hepatic steatosis [K76.0] 02/05/2015  . Abdominal pain [R10.9] 02/05/2015  . Pressure ulcer [L89.90] 02/05/2015  . Protein-calorie malnutrition, severe [E43] 01/19/2015  . Seizures [R56.9] 01/19/2015  . New onset seizure [R56.9]   . Chest pain [R07.9] 01/05/2015  . Suicidal ideation [R45.851] 01/05/2015  . GERD without esophagitis [K21.9] 01/05/2015  . Persistent vomiting [R11.10] 08/31/2014  . HCAP (healthcare-associated pneumonia) [J18.9] 08/31/2014  . Hypokalemia [E87.6] 08/31/2014  . Hyponatremia [E87.1] 08/31/2014  . Major depressive disorder, recurrent, severe without psychotic features [F33.2]   . Alcohol dependence with uncomplicated withdrawal [H08.657]   . Suicidal ideations [R45.851] 07/21/2014  . Drug overdose [T50.901A] 07/21/2014  . Fever [R50.9]   . Overdose [T50.901A] 07/20/2014  . S/P alcohol detoxification [Z09] 04/15/2014  . Alcohol dependence [F10.20] 03/09/2014  . Major depression, recurrent [F33.9] 03/09/2014  . PTSD (post-traumatic stress disorder) [F43.10] 03/09/2014  . Recurrent ventral incisional hernia [K43.2] 01/20/2014  . Incisional hernia, without obstruction or gangrene [K43.2] 11/26/2013  . Colon polyps [K63.5] 11/26/2013  . DJD (degenerative joint disease) [M19.90] 10/28/2013  . Smoking [Z72.0] 10/28/2013  . Knee pain [M25.569] 06/15/2013  . Essential hypertension [I10] 02/17/2013  . FH: lung cancer [Z80.1] 02/17/2013  . COPD (chronic obstructive pulmonary disease) [J44.9] 02/17/2013  . Smoker [Z72.0] 02/17/2013  . GERD (gastroesophageal reflux  disease) [K21.9] 02/17/2013  . Hiatal hernia [K44.9] 02/17/2013  . Bereavement [Z63.4] 02/02/2013   History of Present Illness:: 59 Y/O male known to our service who states he did not do anything for 4-5 months. States he moved with a girl who the only thing she was doing was sit around and drink. States he got depressed started dreaming about the family he lost. States he has dreams that actually he can touch them, that he can smell his kid who he did CPR on. States he has dreams of his dead wife and son at least 3 times a week. States he wants to isolate a lot, has gotten really depressed with ideas to hurt himself  The initial assessment is as follows: Samuel Reyes is an 59 y.o.widowed male who was brought into the [facility] by [employer] after he called 911 because pt sts he wanted to kill himself. Information for this assessment obtained from pt and hospital records. Pt sts that he "wants to die." Pt sts that he is "tired of living." Pt sts that his depression has been getting worse over the last month until tonight he was contemplating killing himself. Pt sts he has attempted to kill himself previously once before by slitting his wrists. Pt sts that he does own 4 guns but his mother has his guns and he does not know what she has done with them. Pt sts that he can't stop thinking about "losing everything." Pt sts that he has lost 1 son in infantcy, 1 son at 55 yo (died 102 years ago) and 2 wives, the most recent having died 3 years ago. Pt sts that he has been living with a roommate that he does not get along with. Pt sts that he has had thoughts of "beating him up" but not of killing him. Pt denies HI,  SHI and AVH. Pt denies any form of abuse: physical, sexual or emotional/verbal. Pt sts he drinks alcohol daily. Pt sts he usually drinks 2-40 oz beers but, today he drank 6-7 40 oz beers and when tested at the hospital tonight, pt's BAL was 340. Pt tested negative for all substances on his UDS. Pt sts that he  sleeps about 3-4 hours per night and in the last few months has lost 14 lbs. Pt sts that he can manage his ADLs but due to his degenerative joint disease his knees do "give out" sometimes and he does fall often..  Elements:  Location:  alcohol dependence depression PTSD. Quality:  relapsed on alcohol bringing memories and dreams of the family he lost developing suicidal ideas. Severity:  severe. Timing:  every day. Duration:  building up for the last few months. Context:  moved in with a drinker he relapsed got more depressed, started having more dreams thoughts about his dead family started thinking about killing himself       . Associated Signs/Symptoms: Depression Symptoms:  depressed mood, insomnia, fatigue, difficulty concentrating, anxiety, panic attacks, loss of energy/fatigue, disturbed sleep, weight loss, (Hypo) Manic Symptoms:  denies Anxiety Symptoms:  Excessive Worry, Panic Symptoms, Psychotic Symptoms:  denies PTSD Symptoms: Had a traumatic exposure:  dead of wife and son Re-experiencing:  Intrusive Thoughts Nightmares Total Time spent with patient: 45 minutes  Past Medical History:  Past Medical History  Diagnosis Date  . Cirrhosis   . Degenerative joint disease   . Hypertension   . Gastric ulcer   . Depression   . Asthma   . Mitral valve prolapse 2002  . H/O hiatal hernia   . COPD (chronic obstructive pulmonary disease)   . TIA (transient ischemic attack)     2010  . Stroke     TIA - 2010 - no deficits   . Shortness of breath     with exertion   . GERD (gastroesophageal reflux disease)   . Headache(784.0)   . Hiatal hernia 1982  . Degenerative joint disease   . Anxiety   . Alcohol abuse     Past Surgical History  Procedure Laterality Date  . Gastrectomy    . Shoulder surgery Bilateral     3 surgeries on on left, 2 surgeries on right   . Rt knee arthroscopic surgery    . Back surgery      3 cervical spine surgeries C4-C5 fused  . Hernia  repair    . Finger surgery Left     2nd, 3rd, & 4th fingers were cut off by table saw and reattached  . Colonoscopy N/A 01/04/2014    Procedure: COLONOSCOPY;  Surgeon: Danie Binder, MD;  Location: [facility];  Service: Endoscopy;  Laterality: N/A;  1:45  . Esophagogastroduodenoscopy N/A 01/04/2014    Procedure: ESOPHAGOGASTRODUODENOSCOPY (EGD);  Surgeon: Danie Binder, MD;  Location: [facility];  Service: Endoscopy;  Laterality: N/A;  . Incisional hernia repair N/A 01/20/2014    Procedure: LAPAROSCOPIC RECURRENT  INCISIONAL HERNIA with mesh;  Surgeon: Edward Jolly, MD;  Location: [facility];  Service: General;  Laterality: N/A;   Family History:  Family History  Problem Relation Age of Onset  . Cancer Father     bone  . Cancer Brother     lungs  . Stroke Maternal Grandmother   . Colon cancer Neg Hx   . Asthma Son     died at age 64 in his sleep   .  Spina bifida Son     died at age 55   sister Bipolar  Social History:  History  Alcohol Use  . 3.6 oz/week  . 6 Cans of beer per week    Comment: 1 case of beer and 1 shot of vodka between 1030am-1430pm     History  Drug Use No    Comment: denid using any drugs    Social History   Social History  . Marital Status: Widowed    Spouse Name: N/A  . Number of Children: N/A  . Years of Education: N/A   Occupational History  . Disability    Social History Main Topics  . Smoking status: Current Every Day Smoker -- 1.00 packs/day for 45 years    Types: Cigarettes    Start date: 07/30/1966  . Smokeless tobacco: Never Used  . Alcohol Use: 3.6 oz/week    6 Cans of beer per week     Comment: 1 case of beer and 1 shot of vodka between 1030am-1430pm  . Drug Use: No     Comment: denid using any drugs  . Sexual Activity: Not Currently   Other Topics Concern  . None   Social History Narrative  states he is not going back to the person. States he used to stay at the shelter and she used to bring dunuts to the shelter got  connected with her. States he gets SSI disability for back and neck. States he has a daughter but has not talk to her in 5 years. 8th grade started working [place] 32 years [employer]. Drinks 5-7 40 ounces a day for the last 4-5 months  Additional Social History:                          Musculoskeletal: Strength & Muscle Tone: within normal limits Gait & Station: normal Patient leans: normal  Psychiatric Specialty Exam: Physical Exam  Review of Systems  Constitutional: Positive for weight loss and malaise/fatigue.  HENT:       Pounding  Eyes: Positive for blurred vision.  Respiratory: Positive for cough.        Pack a day  Cardiovascular: Positive for chest pain and palpitations.       Hiatal hernia  Gastrointestinal: Positive for heartburn, nausea and diarrhea.  Genitourinary:       Hesitancy  Musculoskeletal: Positive for joint pain and neck pain.  Skin: Positive for rash.  Neurological: Positive for dizziness, weakness and headaches.  Endo/Heme/Allergies: Negative.   Psychiatric/Behavioral: Positive for depression and substance abuse. The patient is nervous/anxious and has insomnia.     Blood pressure 133/87, pulse 120, temperature 98.8 F (37.1 C), temperature source Oral, resp. rate 16, height 5' 2.5" (1.588 m), weight 64.071 kg (141 lb 4 oz).Body mass index is 25.41 kg/(m^2).  General Appearance: Fairly Groomed  Engineer, water::  Fair  Speech:  Clear and Coherent  Volume:  fluctuates  Mood:  Anxious and Depressed  Affect:  Depressed and anxious worried  Thought Process:  Coherent and Goal Directed  Orientation:  Full (Time, Place, and Person)  Thought Content:  symptoms events worries concerns  Suicidal Thoughts:  Intermittent but no plans no intent  Homicidal Thoughts:  No  Memory:  Immediate;   Fair Recent;   Fair Remote;   Fair  Judgement:  Fair  Insight:  Present  Psychomotor Activity:  Restlessness  Concentration:  Fair  Recall:  [employer]  of Knowledge:Fair  Language: Fair  Akathisia:  No  Handed:  Right  AIMS (if indicated):     Assets:  Desire for Improvement  ADL's:  Intact  Cognition: WNL  Sleep:  Number of Hours: 6.75   Risk to Self: Is patient at risk for suicide?: Yes Risk to Others:   Prior Inpatient Therapy:  [facility]  Prior Outpatient Therapy:  [facility] not in awhile some AA  Alcohol Screening: 1. How often do you have a drink containing alcohol?: 4 or more times a week 2. How many drinks containing alcohol do you have on a typical day when you are drinking?: 3 or 4 3. How often do you have six or more drinks on one occasion?: Daily or almost daily Preliminary Score: 5 4. How often during the last year have you found that you were not able to stop drinking once you had started?: Daily or almost daily 5. How often during the last year have you failed to do what was normally expected from you becasue of drinking?: Daily or almost daily 6. How often during the last year have you needed a first drink in the morning to get yourself going after a heavy drinking session?: Daily or almost daily 7. How often during the last year have you had a feeling of guilt of remorse after drinking?: Daily or almost daily 8. How often during the last year have you been unable to remember what happened the night before because you had been drinking?: Daily or almost daily 9. Have you or someone else been injured as a result of your drinking?: No 10. Has a relative or friend or a doctor or another health worker been concerned about your drinking or suggested you cut down?: Yes, during the last year Alcohol Use Disorder Identification Test Final Score (AUDIT): 33 Brief Intervention: Yes  Allergies:   Allergies  Allergen Reactions  . Bee Venom Anaphylaxis  . Penicillins Rash   Lab Results:  Results for orders placed or performed during the hospital encounter of 03/13/15 (from the past 48 hour(s))  Comprehensive metabolic panel      Status: Abnormal   Collection Time: 03/13/15  8:20 PM  Result Value Ref Range   Sodium 133 (L) 135 - 145 mmol/L   Potassium 3.8 3.5 - 5.1 mmol/L   Chloride 100 (L) 101 - 111 mmol/L   CO2 21 (L) 22 - 32 mmol/L   Glucose, Bld 92 65 - 99 mg/dL   BUN 6 6 - 20 mg/dL   Creatinine, Ser 0.69 0.61 - 1.24 mg/dL   Calcium 8.3 (L) 8.9 - 10.3 mg/dL   Total Protein 7.2 6.5 - 8.1 g/dL   Albumin 3.6 3.5 - 5.0 g/dL   AST 50 (H) 15 - 41 U/L   ALT 32 17 - 63 U/L   Alkaline Phosphatase 130 (H) 38 - 126 U/L   Total Bilirubin 0.5 0.3 - 1.2 mg/dL   GFR calc non Af Amer >60 >60 mL/min   GFR calc Af Amer >60 >60 mL/min    Comment: (NOTE) The eGFR has been calculated using the CKD EPI equation. This calculation has not been validated in all clinical situations. eGFR's persistently <60 mL/min signify possible Chronic Kidney Disease.    Anion gap 12 5 - 15  Ethanol (ETOH)     Status: Abnormal   Collection Time: 03/13/15  8:20 PM  Result Value Ref Range   Alcohol, Ethyl (B) 340 (HH) <5 mg/dL    Comment: CRITICAL RESULT CALLED TO,  READ BACK BY AND VERIFIED WITH: L FANT RN @ 2111 ON 03/13/15 BY C DAVIS        LOWEST DETECTABLE LIMIT FOR SERUM ALCOHOL IS 5 mg/dL FOR MEDICAL PURPOSES ONLY   CBC     Status: Abnormal   Collection Time: 03/13/15  8:20 PM  Result Value Ref Range   WBC 7.7 4.0 - 10.5 K/uL   RBC 4.29 4.22 - 5.81 MIL/uL   Hemoglobin 13.2 13.0 - 17.0 g/dL   HCT 39.0 39.0 - 52.0 %   MCV 90.9 78.0 - 100.0 fL   MCH 30.8 26.0 - 34.0 pg   MCHC 33.8 30.0 - 36.0 g/dL   RDW 17.0 (H) 11.5 - 15.5 %   Platelets 249 150 - 400 K/uL  Urine rapid drug screen (hosp performed) (Not at [facility])     Status: None   Collection Time: 03/13/15  8:50 PM  Result Value Ref Range   Opiates NONE DETECTED NONE DETECTED   Cocaine NONE DETECTED NONE DETECTED   Benzodiazepines NONE DETECTED NONE DETECTED   Amphetamines NONE DETECTED NONE DETECTED   Tetrahydrocannabinol NONE DETECTED NONE DETECTED   Barbiturates NONE  DETECTED NONE DETECTED    Comment:        DRUG SCREEN FOR MEDICAL PURPOSES ONLY.  IF CONFIRMATION IS NEEDED FOR ANY PURPOSE, NOTIFY LAB WITHIN 5 DAYS.        LOWEST DETECTABLE LIMITS FOR URINE DRUG SCREEN Drug Class       Cutoff (ng/mL) Amphetamine      1000 Barbiturate      200 Benzodiazepine   008 Tricyclics       676 Opiates          300 Cocaine          300 THC              50    Current Medications: Current Facility-Administered Medications  Medication Dose Route Frequency Provider Last Rate Last Dose  . acetaminophen (TYLENOL) tablet 650 mg  650 mg Oral Q6H PRN Patrecia Pour, NP   650 mg at 03/14/15 1543  . alum & mag hydroxide-simeth (MAALOX/MYLANTA) 200-200-20 MG/5ML suspension 30 mL  30 mL Oral Q4H PRN Patrecia Pour, NP      . chlordiazePOXIDE (LIBRIUM) capsule 25 mg  25 mg Oral Q6H PRN Laverle Hobby, PA-C      . chlordiazePOXIDE (LIBRIUM) capsule 25 mg  25 mg Oral QID Laverle Hobby, PA-C   25 mg at 03/15/15 0805   Followed by  . [START ON 03/16/2015] chlordiazePOXIDE (LIBRIUM) capsule 25 mg  25 mg Oral TID Laverle Hobby, PA-C       Followed by  . [START ON 03/17/2015] chlordiazePOXIDE (LIBRIUM) capsule 25 mg  25 mg Oral BH-qamhs Spencer E Simon, PA-C       Followed by  . [START ON 03/19/2015] chlordiazePOXIDE (LIBRIUM) capsule 25 mg  25 mg Oral Daily Laverle Hobby, PA-C      . folic acid (FOLVITE) tablet 1 mg  1 mg Oral Daily Patrecia Pour, NP   1 mg at 03/15/15 0805  . gabapentin (NEURONTIN) capsule 300 mg  300 mg Oral BID Patrecia Pour, NP   300 mg at 03/15/15 0805  . hydrOXYzine (ATARAX/VISTARIL) tablet 25 mg  25 mg Oral Q6H PRN Patrecia Pour, NP      . Ipratropium-Albuterol (COMBIVENT) respimat 2 puff  2 puff Inhalation BID Patrecia Pour, NP   2  puff at 03/15/15 0805  . loperamide (IMODIUM) capsule 2-4 mg  2-4 mg Oral PRN Patrecia Pour, NP      . magnesium hydroxide (MILK OF MAGNESIA) suspension 30 mL  30 mL Oral Daily PRN Patrecia Pour, NP      .  multivitamin with minerals tablet 1 tablet  1 tablet Oral Daily Patrecia Pour, NP   1 tablet at 03/15/15 0805  . pantoprazole (PROTONIX) EC tablet 40 mg  40 mg Oral Daily Patrecia Pour, NP   40 mg at 03/15/15 0805  . thiamine (VITAMIN B-1) tablet 100 mg  100 mg Oral Daily Patrecia Pour, NP   100 mg at 03/15/15 0804  . traZODone (DESYREL) tablet 100 mg  100 mg Oral Once Laverle Hobby, PA-C   100 mg at 03/14/15 2315  . traZODone (DESYREL) tablet 150 mg  150 mg Oral QHS PRN,MR X 1 Spencer E Simon, PA-C       PTA Medications: Prescriptions prior to admission  Medication Sig Dispense Refill Last Dose  . acamprosate (CAMPRAL) 333 MG tablet Take 2 tablets (666 mg total) by mouth 3 (three) times daily with meals. For alcoholism 60 tablet 0 03/13/2015 at Unknown time  . albuterol (PROVENTIL HFA;VENTOLIN HFA) 108 (90 BASE) MCG/ACT inhaler Inhale 2 puffs into the lungs every 6 (six) hours as needed for wheezing or shortness of breath. 1 Inhaler 5 03/13/2015 at Unknown time  . albuterol-ipratropium (COMBIVENT) 18-103 MCG/ACT inhaler Inhale 2 puffs into the lungs 2 (two) times daily. For shortness of breath 1 Inhaler 5 03/13/2015 at Unknown time  . feeding supplement, ENSURE ENLIVE, (ENSURE ENLIVE) LIQD Take 237 mLs by mouth 2 (two) times daily between meals. (Patient not taking: Reported on 03/05/2015) 237 mL 0 Not Taking at Unknown time  . folic acid (FOLVITE) 1 MG tablet Take 1 tablet (1 mg total) by mouth daily. 30 tablet 0 03/13/2015 at Unknown time  . gabapentin (NEURONTIN) 300 MG capsule Take 1 capsule (300 mg total) by mouth 2 (two) times daily. For substance withdrawal syndrome 60 capsule 0 03/13/2015 at Unknown time  . Multiple Vitamin (MULTIVITAMIN WITH MINERALS) TABS tablet Take 1 tablet by mouth daily. (Patient not taking: Reported on 03/13/2015) 30 tablet 0 Not Taking at Unknown time  . omeprazole (PRILOSEC) 40 MG capsule Take 1 capsule (40 mg total) by mouth daily. 60 capsule 1 03/13/2015 at Unknown  time  . trazodone (DESYREL) 150 MG tablet Take 2 tablets (300 mg total) by mouth at bedtime. For sleep 60 tablet 0 03/12/2015 at Unknown time    Previous Psychotropic Medications: Yes but not taking and cant remember. States he takes Trazodone at home 300 mg  Substance Abuse History in the last 12 months:  Yes.      Consequences of Substance Abuse: Legal Consequences:  3 DWI no license or car Blackouts:   Withdrawal Symptoms:   Diaphoresis Diarrhea Headaches Nausea Tremors Vomiting seizure a month ago  Results for orders placed or performed during the hospital encounter of 03/13/15 (from the past 72 hour(s))  Comprehensive metabolic panel     Status: Abnormal   Collection Time: 03/13/15  8:20 PM  Result Value Ref Range   Sodium 133 (L) 135 - 145 mmol/L   Potassium 3.8 3.5 - 5.1 mmol/L   Chloride 100 (L) 101 - 111 mmol/L   CO2 21 (L) 22 - 32 mmol/L   Glucose, Bld 92 65 - 99 mg/dL   BUN 6 6 -  20 mg/dL   Creatinine, Ser 0.69 0.61 - 1.24 mg/dL   Calcium 8.3 (L) 8.9 - 10.3 mg/dL   Total Protein 7.2 6.5 - 8.1 g/dL   Albumin 3.6 3.5 - 5.0 g/dL   AST 50 (H) 15 - 41 U/L   ALT 32 17 - 63 U/L   Alkaline Phosphatase 130 (H) 38 - 126 U/L   Total Bilirubin 0.5 0.3 - 1.2 mg/dL   GFR calc non Af Amer >60 >60 mL/min   GFR calc Af Amer >60 >60 mL/min    Comment: (NOTE) The eGFR has been calculated using the CKD EPI equation. This calculation has not been validated in all clinical situations. eGFR's persistently <60 mL/min signify possible Chronic Kidney Disease.    Anion gap 12 5 - 15  Ethanol (ETOH)     Status: Abnormal   Collection Time: 03/13/15  8:20 PM  Result Value Ref Range   Alcohol, Ethyl (B) 340 (HH) <5 mg/dL    Comment: CRITICAL RESULT CALLED TO, READ BACK BY AND VERIFIED WITH: L FANT RN @ 2111 ON 03/13/15 BY C DAVIS        LOWEST DETECTABLE LIMIT FOR SERUM ALCOHOL IS 5 mg/dL FOR MEDICAL PURPOSES ONLY   CBC     Status: Abnormal   Collection Time: 03/13/15  8:20 PM   Result Value Ref Range   WBC 7.7 4.0 - 10.5 K/uL   RBC 4.29 4.22 - 5.81 MIL/uL   Hemoglobin 13.2 13.0 - 17.0 g/dL   HCT 39.0 39.0 - 52.0 %   MCV 90.9 78.0 - 100.0 fL   MCH 30.8 26.0 - 34.0 pg   MCHC 33.8 30.0 - 36.0 g/dL   RDW 17.0 (H) 11.5 - 15.5 %   Platelets 249 150 - 400 K/uL  Urine rapid drug screen (hosp performed) (Not at [facility])     Status: None   Collection Time: 03/13/15  8:50 PM  Result Value Ref Range   Opiates NONE DETECTED NONE DETECTED   Cocaine NONE DETECTED NONE DETECTED   Benzodiazepines NONE DETECTED NONE DETECTED   Amphetamines NONE DETECTED NONE DETECTED   Tetrahydrocannabinol NONE DETECTED NONE DETECTED   Barbiturates NONE DETECTED NONE DETECTED    Comment:        DRUG SCREEN FOR MEDICAL PURPOSES ONLY.  IF CONFIRMATION IS NEEDED FOR ANY PURPOSE, NOTIFY LAB WITHIN 5 DAYS.        LOWEST DETECTABLE LIMITS FOR URINE DRUG SCREEN Drug Class       Cutoff (ng/mL) Amphetamine      1000 Barbiturate      200 Benzodiazepine   161 Tricyclics       096 Opiates          300 Cocaine          300 THC              50     Observation Level/Precautions:  15 minute checks  Laboratory:  As per the ED  Psychotherapy: Individual/group   Medications:  Librium detox protocol  Consultations:    Discharge Concerns:    Estimated LOS: 3-5 days  Other:     Psychological Evaluations: No   Treatment Plan Summary: Daily contact with patient to assess and evaluate symptoms and progress in treatment and Medication management Supportive approach/coping skills Alcohol dependence; detox with Librium/work a relapse prevention plan Insomnia; increase the Trazodone to 300 mg HS Depression; reassess for the need for an antidepressant Trauma; help process the traumatic deaths/grief loss  Explore residential treatment options Medical Decision Making:  Review of Psycho-Social Stressors (1), Review or order clinical lab tests (1), Review of Medication Regimen & Side Effects (2) and  Review of New Medication or Change in Dosage (2)  I certify that inpatient services furnished can reasonably be expected to improve the patient's condition.   Samuel Reyes A 8/16/20168:22 AM

## 2015-03-15 NOTE — Progress Notes (Signed)
[facility] Group Notes:  (Nursing/MHT/Case Management/Adjunct)  Date:  03/15/2015  Time:  2100  Type of Therapy:  wrap up group  Participation Level:  Active  Participation Quality:  Appropriate, Attentive, Sharing and Supportive  Affect:  Appropriate  Cognitive:  Appropriate  Insight:  Improving  Engagement in Group:  Engaged  Modes of Intervention:  Clarification, Education and Support  Summary of Progress/Problems: Pt reports having 5 months clean and then moving into a "drinking house" where he began drinking too. Pt wants to seek out grief counseling and needs a clean sober house/place to go to.   Jacques Navy 03/15/2015, 10:09 PM

## 2015-03-15 NOTE — [facility] Group Notes (Signed)
[facility] Group Notes:  (Nursing/MHT/Case Management/Adjunct)  Date:  03/15/2015  Time:  0900 am  Type of Therapy:  Psychoeducational Skills  Participation Level:  Active  Participation Quality:  Appropriate and Attentive  Affect:  Appropriate  Cognitive:  Alert and Appropriate  Insight:  Good  Engagement in Group:  Engaged  Modes of Intervention:  Support  Summary of Progress/Problems: Samuel Reyes spoke about his alcohol addiction and his past.  Patient has had several losses in his life.  Ordered spiritual consult for him.  Samuel Reyes 03/15/2015, 9:57 AM

## 2015-03-15 NOTE — [facility] Group Notes (Signed)
[facility] LCSW Group Therapy  03/15/2015   1:15 PM   Type of Therapy:  Group Therapy  Participation Level:  Active  Participation Quality:  Attentive, Sharing and Supportive  Affect:  Depressed and Flat  Cognitive:  Alert and Oriented  Insight:  Developing/Improving and Engaged  Engagement in Therapy:  Developing/Improving and Engaged  Modes of Intervention:  Clarification, Confrontation, Discussion, Education, Exploration, Limit-setting, Orientation, Problem-solving, Rapport Building, Art therapist, Socialization and Support  Summary of Progress/Problems: The topic for group therapy was feelings about diagnosis.  Pt actively participated in group discussion on their past and current diagnosis and how they feel towards this.  Pt also identified how society and family members judge them, based on their diagnosis as well as stereotypes and stigmas.  Patient discussed his tendency to isolate and ruminate about the past and the deaths of his sons and other relatives. Patient reports that he finds that his addiction "controls" him and that he uses alcohol to self-medicate. Patient shared that he questions his faith since the death of his children. CSW and other group members provided patient with emotional support and encouragement.  Tilden Fossa, MSW, [place] Worker [facility] [phone]

## 2015-03-15 NOTE — Tx Team (Signed)
Interdisciplinary Treatment Plan Update (Adult) Date: 03/15/2015    Time Reviewed: 9:30 AM  Progress in Treatment: Attending groups: Continuing to assess, patient new to milieu Participating in groups: Continuing to assess, patient new to milieu Taking medication as prescribed: Yes Tolerating medication: Yes Family/Significant other contact made: No, CSW assessing for appropriate contacts Patient understands diagnosis: Yes Discussing patient identified problems/goals with staff: Yes Medical problems stabilized or resolved: Yes Denies suicidal/homicidal ideation: Yes Issues/concerns per patient self-inventory: Yes Other:  New problem(s) identified: N/A  Discharge Plan or Barriers: 03/15/2015:  CSW continuing to assess, patient new to milieu.  Reason for Continuation of Hospitalization:  Depression Anxiety Medication Stabilization   Comments: N/A  Estimated length of stay: 3-5 days   Patient is a 59 y.o. male with increasing depression over the past month & SI. Patient reports a past suicide attempt by cutting his wrists. He states that he owns 4 guns but his mother has his guns and he does not know what she has done with them. Reports being at risk for homelessness. He drinks 2-40 oz beers daily. Last here in January 2016. Patient will benefit from crisis stabilization, medication evaluation, group therapy, and psycho education in addition to case management for discharge planning. Patient and CSW reviewed pt's identified goals and treatment plan. Pt verbalized understanding and agreed to treatment plan.    Review of initial/current patient goals per problem list:  1. Goal(s): Patient will participate in aftercare plan   Met: No   Target date: 3-5 days post admission date   As evidenced by: Patient will participate within aftercare plan AEB aftercare provider and housing plan at discharge being identified.  03/15/2015: Goal not met: CSW assessing for appropriate  referrals for pt and will have follow up secured prior to d/c.   2. Goal (s): Patient will exhibit decreased depressive symptoms and suicidal ideations.   Met: No   Target date: 3-5 days post admission date   As evidenced by: Patient will utilize self rating of depression at 3 or below and demonstrate decreased signs of depression or be deemed stable for discharge by MD.  03/15/2015: Goal not met: Pt presents with flat affect and depressed mood.  Pt admitted with depression rating of 10.  Pt to show decreased sign of depression and a rating of 3 or less before d/c.       3. Goal(s): Patient will demonstrate decreased signs and symptoms of anxiety.   Met: No   Target date: 3-5 days post admission date   As evidenced by: Patient will utilize self rating of anxiety at 3 or below and demonstrated decreased signs of anxiety, or be deemed stable for discharge by MD  03/15/2015: Goal not met: Pt presents with anxious mood and affect.  Pt admitted with anxiety rating of 10.  Pt to show decreased sign of anxiety and a rating of 3 or less before d/c.     4. Goal(s): Patient will demonstrate decreased signs of withdrawal due to substance abuse   Met: Yes   Target date: 3-5 days post admission date   As evidenced by: Patient will produce a CIWA/COWS score of 0, have stable vitals signs, and no symptoms of withdrawal  03/15/2015: Goal met: No withdrawal symptoms reported at this time per medical chart.    Attendees: Patient:    Family:    Physician: Dr. Parke Poisson; Dr. Sabra Heck 03/15/2015 9:30 AM  Nursing: Mayra Neer, Festus Aloe, Patrice Hugo, [place] RN 03/15/2015 9:30 AM  Clinical Social Worker: Tilden Fossa,  [place] 03/15/2015 9:30 AM  Other: Peri Maris, Heather Smart, LCSWA 03/15/2015 9:30 AM  Other: Debarah Crape, [facility] 03/15/2015 9:30 AM  Other:  03/15/2015 9:30 AM  Other: Ave Filter, NP 03/15/2015 9:30 AM  Other:    Other:    Other:      Scribe  for Treatment Team:  Tilden Fossa, MSW, [place] [phone]

## 2015-03-15 NOTE — BHH Suicide Risk Assessment (Signed)
[facility] Admission Suicide Risk Assessment   Nursing information obtained from:    Demographic factors:    Current Mental Status:    Loss Factors:    Historical Factors:    Risk Reduction Factors:    Total Time spent with patient: 45 minutes Principal Problem: Alcohol dependence with uncomplicated withdrawal Diagnosis:   Patient Active Problem List   Diagnosis Date Noted  . Hypochloremia [E87.8]   . Epigastric pain [R10.13]   . Hepatic steatosis [K76.0] 02/05/2015  . Abdominal pain [R10.9] 02/05/2015  . Pressure ulcer [L89.90] 02/05/2015  . Protein-calorie malnutrition, severe [E43] 01/19/2015  . Seizures [R56.9] 01/19/2015  . New onset seizure [R56.9]   . Chest pain [R07.9] 01/05/2015  . Suicidal ideation [R45.851] 01/05/2015  . GERD without esophagitis [K21.9] 01/05/2015  . Persistent vomiting [R11.10] 08/31/2014  . HCAP (healthcare-associated pneumonia) [J18.9] 08/31/2014  . Hypokalemia [E87.6] 08/31/2014  . Hyponatremia [E87.1] 08/31/2014  . Major depressive disorder, recurrent, severe without psychotic features [F33.2]   . Alcohol dependence with uncomplicated withdrawal [Q46.962]   . Suicidal ideations [R45.851] 07/21/2014  . Drug overdose [T50.901A] 07/21/2014  . Fever [R50.9]   . Overdose [T50.901A] 07/20/2014  . S/P alcohol detoxification [Z09] 04/15/2014  . Alcohol dependence [F10.20] 03/09/2014  . Major depression, recurrent [F33.9] 03/09/2014  . PTSD (post-traumatic stress disorder) [F43.10] 03/09/2014  . Recurrent ventral incisional hernia [K43.2] 01/20/2014  . Incisional hernia, without obstruction or gangrene [K43.2] 11/26/2013  . Colon polyps [K63.5] 11/26/2013  . DJD (degenerative joint disease) [M19.90] 10/28/2013  . Smoking [Z72.0] 10/28/2013  . Knee pain [M25.569] 06/15/2013  . Essential hypertension [I10] 02/17/2013  . FH: lung cancer [Z80.1] 02/17/2013  . COPD (chronic obstructive pulmonary disease) [J44.9] 02/17/2013  . Smoker [Z72.0] 02/17/2013  .  GERD (gastroesophageal reflux disease) [K21.9] 02/17/2013  . Hiatal hernia [K44.9] 02/17/2013  . Bereavement [Z63.4] 02/02/2013     Continued Clinical Symptoms:  Alcohol Use Disorder Identification Test Final Score (AUDIT): 33 The "Alcohol Use Disorders Identification Test", Guidelines for Use in Primary Care, Second Edition.  World Pharmacologist [facility]). Score between 0-7:  no or low risk or alcohol related problems. Score between 8-15:  moderate risk of alcohol related problems. Score between 16-19:  high risk of alcohol related problems. Score 20 or above:  warrants further diagnostic evaluation for alcohol dependence and treatment.   CLINICAL FACTORS:   Depression:   Comorbid alcohol abuse/dependence Alcohol/Substance Abuse/Dependencies   Psychiatric Specialty Exam: Physical Exam  ROS  Blood pressure 123/79, pulse 101, temperature 97.6 F (36.4 C), temperature source Oral, resp. rate 16, height 5' 2.5" (1.588 m), weight 64.071 kg (141 lb 4 oz).Body mass index is 25.41 kg/(m^2).   COGNITIVE FEATURES THAT CONTRIBUTE TO RISK:  Closed-mindedness, Polarized thinking and Thought constriction (tunnel vision)    SUICIDE RISK:   Moderate:  Frequent suicidal ideation with limited intensity, and duration, some specificity in terms of plans, no associated intent, good self-control, limited dysphoria/symptomatology, some risk factors present, and identifiable protective factors, including available and accessible social support.  PLAN OF CARE: Supportive approach/coping skills                              Alcohol dependence; detox as needed                              Work a relapse prevention plan  Reassess and address the co morbities                              Explore placement options Medical Decision Making:  Review of Psycho-Social Stressors (1), Review or order clinical lab tests (1), Review of Medication Regimen & Side Effects (2) and Review of  New Medication or Change in Dosage (2)  I certify that inpatient services furnished can reasonably be expected to improve the patient's condition.   [place] A 03/15/2015, 5:33 PM

## 2015-03-15 NOTE — BHH Suicide Risk Assessment (Signed)
[place] INPATIENT:  Family/Significant Other Suicide Prevention Education  Suicide Prevention Education:  Contact Attempts: Zonia Kief [phone], (name of family member/significant other) has been identified by the patient as the family member/significant other with whom the patient will be residing, and identified as the person(s) who will aid the patient in the event of a mental health crisis.  With written consent from the patient, two attempts were made to provide suicide prevention education, prior to and/or following the patient's discharge.  We were unsuccessful in providing suicide prevention education.  A suicide education pamphlet was given to the patient to share with family/significant other.  Date and time of first attempt: 03/15/15 at 10:30am Date and time of second attempt: 03/16/15 at 11:35am  Ginevra Tacker, Casimiro Needle 03/15/2015, 10:34 AM
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
## 2015-03-16 DIAGNOSIS — F332 Major depressive disorder, recurrent severe without psychotic features: Secondary | ICD-10-CM

## 2015-03-16 DIAGNOSIS — F4310 Post-traumatic stress disorder, unspecified: Secondary | ICD-10-CM

## 2015-03-16 MED ORDER — FLUOXETINE HCL 10 MG PO CAPS: 10.0000 mg | ORAL_CAPSULE | Freq: Every day | ORAL | Status: DC

## 2015-03-16 MED ORDER — FLUOXETINE HCL 10 MG PO CAPS
10.0000 mg | ORAL_CAPSULE | Freq: Every day | ORAL | Status: DC
  Administered 2015-03-17 – 2015-03-18 (×2): 10 mg via ORAL
  Filled 2015-03-16 (×2): qty 1
  Filled 2015-03-16: qty 3
  Filled 2015-03-16 (×2): qty 1

## 2015-03-16 NOTE — Progress Notes (Signed)
D: Patient visible in the milieu.  He is attending groups and participating in his treatment.  Patient continues to ruminate over the death of his son and wife.  Patient is pleasant and cooperative.  He rates his depression as a 5; hopelessness as a 3; anxiety as a 3.  He reports poor sleep with nightmares.  Patient reports minimal withdrawal symptoms as agitation and irritability.  His goal is to "try to find somewhere to go when I leave here. A: Continue to monitor medication management and MD orders.  Safety checks completed every 15 minutes per protocol.  Offer encouragement and support as needed. R: Patient's behavior is appropriate to situation.

## 2015-03-16 NOTE — Progress Notes (Signed)
D:Patient in the dayroom on approach.  Patient states he had a good day and he ate well.  Patient states his goal for today was to listen to what is going on in groups.  Patient has been visible on the unit but minimal interactions with peers and staff.  Patient denies SI/HI and denies AVH.   A: Staff to monitor Q 15 mins for safety.  Encouragement and support offered.  Scheduled medications administered per orders. R: Patient remains safe on the unit.  Patient attended group tonight.  Patient visible on the unit.  Patient taking administered medications

## 2015-03-16 NOTE — Progress Notes (Signed)
Recreation Therapy Notes  Date: 08.17.2016 Time: 9:30am Location: [address] Group room   Group Topic: Stress Management  Goal Area(s) Addresses:  Patient will actively participate in stress management techniques presented during session.   Behavioral Response: Did not attend.   Laureen Ochs [place], LRT/CTRS        Shaka Zech L 03/16/2015 3:03 PM

## 2015-03-16 NOTE — Progress Notes (Signed)
Pt attended NA group this evening.  

## 2015-03-16 NOTE — Plan of Care (Signed)
Problem: Alteration in mood Goal: LTG-Patient reports reduction in suicidal thoughts (Patient reports reduction in suicidal thoughts and is able to verbalize a safety plan for whenever patient is feeling suicidal)  Outcome: Progressing Patient denies SI.  Patient verbally contracts for safety.

## 2015-03-16 NOTE — BHH Group Notes (Signed)
[place] LCSW Group Therapy 03/16/2015  1:15 PM Type of Therapy: Group Therapy Participation Level: Active  Participation Quality: Attentive, Sharing and Supportive  Affect: Appropriate  Cognitive: Alert and Oriented  Insight: Developing/Improving and Engaged  Engagement in Therapy: Developing/Improving and Engaged  Modes of Intervention: Clarification, Confrontation, Discussion, Education, Exploration, Limit-setting, Orientation, Problem-solving, Rapport Building, Art therapist, Socialization and Support  Summary of Progress/Problems: The topic for group today was emotional regulation. This group focused on both positive and negative emotion identification and allowed group members to process ways to identify feelings, regulate negative emotions, and find healthy ways to manage internal/external emotions. Group members were asked to reflect on a time when their reaction to an emotion led to a negative outcome and explored how alternative responses using emotion regulation would have benefited them. Group members were also asked to discuss a time when emotion regulation was utilized when a negative emotion was experienced. Patient participated in discussion of emotional stressors, warning signs, and coping strategies. CSW reviewed cognitive triangle with group and patient participated in working through various scenarios.   Samuel Reyes, MSW, [place] Worker [facility] [phone]

## 2015-03-16 NOTE — Progress Notes (Signed)
[facility] MD Progress Note  03/16/2015 4:52 PM Arlee Santosuosso  MRN:  [id number] Subjective:  Samuel Reyes endorses that he does not know why is he having the dreams he is having. States in his dream he saw his son standing there. He states he cant go back to his past behaviors. He was just sitting and drinking. States he needs to continue to work on the grief. He lost another relative not long ago and thinks this has probably triggered things coming back. On the other hand states it was a wake up call to quit what he was doing Principal Problem: Alcohol dependence with uncomplicated withdrawal Diagnosis:   Patient Active Problem List   Diagnosis Date Noted  . Hypochloremia [E87.8]   . Epigastric pain [R10.13]   . Hepatic steatosis [K76.0] 02/05/2015  . Abdominal pain [R10.9] 02/05/2015  . Pressure ulcer [L89.90] 02/05/2015  . Protein-calorie malnutrition, severe [E43] 01/19/2015  . Seizures [R56.9] 01/19/2015  . New onset seizure [R56.9]   . Chest pain [R07.9] 01/05/2015  . Suicidal ideation [R45.851] 01/05/2015  . GERD without esophagitis [K21.9] 01/05/2015  . Persistent vomiting [R11.10] 08/31/2014  . HCAP (healthcare-associated pneumonia) [J18.9] 08/31/2014  . Hypokalemia [E87.6] 08/31/2014  . Hyponatremia [E87.1] 08/31/2014  . Major depressive disorder, recurrent, severe without psychotic features [F33.2]   . Alcohol dependence with uncomplicated withdrawal [P32.951]   . Suicidal ideations [R45.851] 07/21/2014  . Drug overdose [T50.901A] 07/21/2014  . Fever [R50.9]   . Overdose [T50.901A] 07/20/2014  . S/P alcohol detoxification [Z09] 04/15/2014  . Alcohol dependence [F10.20] 03/09/2014  . Major depression, recurrent [F33.9] 03/09/2014  . PTSD (post-traumatic stress disorder) [F43.10] 03/09/2014  . Recurrent ventral incisional hernia [K43.2] 01/20/2014  . Incisional hernia, without obstruction or gangrene [K43.2] 11/26/2013  . Colon polyps [K63.5] 11/26/2013  . DJD (degenerative joint  disease) [M19.90] 10/28/2013  . Smoking [Z72.0] 10/28/2013  . Knee pain [M25.569] 06/15/2013  . Essential hypertension [I10] 02/17/2013  . FH: lung cancer [Z80.1] 02/17/2013  . COPD (chronic obstructive pulmonary disease) [J44.9] 02/17/2013  . Smoker [Z72.0] 02/17/2013  . GERD (gastroesophageal reflux disease) [K21.9] 02/17/2013  . Hiatal hernia [K44.9] 02/17/2013  . Bereavement [Z63.4] 02/02/2013   Total Time spent with patient: 30 minutes   Past Medical History:  Past Medical History  Diagnosis Date  . Cirrhosis   . Degenerative joint disease   . Hypertension   . Gastric ulcer   . Depression   . Asthma   . Mitral valve prolapse 2002  . H/O hiatal hernia   . COPD (chronic obstructive pulmonary disease)   . TIA (transient ischemic attack)     2010  . Stroke     TIA - 2010 - no deficits   . Shortness of breath     with exertion   . GERD (gastroesophageal reflux disease)   . Headache(784.0)   . Hiatal hernia 1982  . Degenerative joint disease   . Anxiety   . Alcohol abuse     Past Surgical History  Procedure Laterality Date  . Gastrectomy    . Shoulder surgery Bilateral     3 surgeries on on left, 2 surgeries on right   . Rt knee arthroscopic surgery    . Back surgery      3 cervical spine surgeries C4-C5 fused  . Hernia repair    . Finger surgery Left     2nd, 3rd, & 4th fingers were cut off by table saw and reattached  . Colonoscopy N/A 01/04/2014  Procedure: COLONOSCOPY;  Surgeon: Danie Binder, MD;  [facility];  Service: Endoscopy;  Laterality: N/A;  1:45  . Esophagogastroduodenoscopy N/A 01/04/2014    Procedure: ESOPHAGOGASTRODUODENOSCOPY (EGD);  Surgeon: Danie Binder, MD;  Location: [facility];  Service: Endoscopy;  Laterality: N/A;  . Incisional hernia repair N/A 01/20/2014    Procedure: LAPAROSCOPIC RECURRENT  INCISIONAL HERNIA with mesh;  Surgeon: Edward Jolly, MD;  Location: [facility];  Service: General;  Laterality: N/A;   Family  History:  Family History  Problem Relation Age of Onset  . Cancer Father     bone  . Cancer Brother     lungs  . Stroke Maternal Grandmother   . Colon cancer Neg Hx   . Asthma Son     died at age 58 in his sleep   . Spina bifida Son     died at age 2    Social History:  History  Alcohol Use  . 3.6 oz/week  . 6 Cans of beer per week    Comment: 1 case of beer and 1 shot of vodka between 1030am-1430pm     History  Drug Use No    Comment: denid using any drugs    Social History   Social History  . Marital Status: Widowed    Spouse Name: N/A  . Number of Children: N/A  . Years of Education: N/A   Occupational History  . Disability    Social History Main Topics  . Smoking status: Current Every Day Smoker -- 1.00 packs/day for 45 years    Types: Cigarettes    Start date: 07/30/1966  . Smokeless tobacco: Never Used  . Alcohol Use: 3.6 oz/week    6 Cans of beer per week     Comment: 1 case of beer and 1 shot of vodka between 1030am-1430pm  . Drug Use: No     Comment: denid using any drugs  . Sexual Activity: Not Currently   Other Topics Concern  . None   Social History Narrative   Additional History:    Sleep: Fair  Appetite:  Fair   Assessment:   Musculoskeletal: Strength & Muscle Tone: within normal limits Gait & Station: normal Patient leans: normal   Psychiatric Specialty Exam: Physical Exam  Review of Systems  Constitutional: Positive for malaise/fatigue.  HENT: Negative.   Eyes: Negative.   Respiratory: Negative.   Cardiovascular: Positive for chest pain.  Gastrointestinal: Negative.   Genitourinary: Negative.   Musculoskeletal: Negative.   Skin: Negative.   Neurological: Positive for weakness.  Endo/Heme/Allergies: Negative.   Psychiatric/Behavioral: Positive for depression and substance abuse. The patient is nervous/anxious.     Blood pressure 117/78, pulse 86, temperature 97.7 F (36.5 C), temperature source Oral, resp. rate 24,  height 5' 2.5" (1.588 m), weight 64.071 kg (141 lb 4 oz).Body mass index is 25.41 kg/(m^2).  General Appearance: Fairly Groomed  Engineer, water::  Fair  Speech:  Clear and Coherent  Volume:  Decreased  Mood:  Anxious and Depressed  Affect:  anxious worried  Thought Process:  Coherent and Goal Directed  Orientation:  Full (Time, Place, and Person)  Thought Content:  symptoms events worries concerns  Suicidal Thoughts:  No  Homicidal Thoughts:  No  Memory:  Immediate;   Fair Recent;   Fair Remote;   Fair  Judgement:  Fair  Insight:  Present and Shallow  Psychomotor Activity:  Restlessness  Concentration:  Fair  Recall:  [employer] of  Knowledge:Fair  Language: Fair  Akathisia:  No  Handed:  Right  AIMS (if indicated):     Assets:  Desire for Improvement  ADL's:  Intact  Cognition: WNL  Sleep:  Number of Hours: 7.45     Current Medications: Current Facility-Administered Medications  Medication Dose Route Frequency Provider Last Rate Last Dose  . acetaminophen (TYLENOL) tablet 650 mg  650 mg Oral Q6H PRN Patrecia Pour, NP   650 mg at 03/16/15 1613  . alum & mag hydroxide-simeth (MAALOX/MYLANTA) 200-200-20 MG/5ML suspension 30 mL  30 mL Oral Q4H PRN Patrecia Pour, NP      . chlordiazePOXIDE (LIBRIUM) capsule 25 mg  25 mg Oral Q6H PRN Laverle Hobby, PA-C      . chlordiazePOXIDE (LIBRIUM) capsule 25 mg  25 mg Oral TID Laverle Hobby, PA-C   25 mg at 03/16/15 1613   Followed by  . [START ON 03/17/2015] chlordiazePOXIDE (LIBRIUM) capsule 25 mg  25 mg Oral BH-qamhs Spencer E Simon, PA-C       Followed by  . [START ON 03/19/2015] chlordiazePOXIDE (LIBRIUM) capsule 25 mg  25 mg Oral Daily Laverle Hobby, PA-C      . [START ON 03/17/2015] FLUoxetine (PROZAC) capsule 10 mg  10 mg Oral Daily Nicholaus Bloom, MD      . folic acid (FOLVITE) tablet 1 mg  1 mg Oral Daily Patrecia Pour, NP   1 mg at 03/16/15 9702  . gabapentin (NEURONTIN) capsule 300 mg  300 mg Oral BID Patrecia Pour, NP    300 mg at 03/16/15 1613  . hydrOXYzine (ATARAX/VISTARIL) tablet 25 mg  25 mg Oral Q6H PRN Patrecia Pour, NP      . Ipratropium-Albuterol (COMBIVENT) respimat 2 puff  2 puff Inhalation BID Patrecia Pour, NP   2 puff at 03/16/15 1613  . loperamide (IMODIUM) capsule 2-4 mg  2-4 mg Oral PRN Patrecia Pour, NP      . magnesium hydroxide (MILK OF MAGNESIA) suspension 30 mL  30 mL Oral Daily PRN Patrecia Pour, NP      . metoprolol tartrate (LOPRESSOR) tablet 25 mg  25 mg Oral QAC breakfast Encarnacion Slates, NP   25 mg at 03/16/15 6378  . multivitamin with minerals tablet 1 tablet  1 tablet Oral Daily Patrecia Pour, NP   1 tablet at 03/16/15 [id number]  . nicotine (NICODERM CQ - dosed in mg/24 hours) patch 21 mg  21 mg Transdermal Daily Nicholaus Bloom, MD   21 mg at 03/16/15 0815  . pantoprazole (PROTONIX) EC tablet 40 mg  40 mg Oral Daily Patrecia Pour, NP   40 mg at 03/16/15 0277  . thiamine (VITAMIN B-1) tablet 100 mg  100 mg Oral Daily Patrecia Pour, NP   100 mg at 03/16/15 4128  . traZODone (DESYREL) tablet 100 mg  100 mg Oral Once Laverle Hobby, PA-C   100 mg at 03/14/15 2315  . traZODone (DESYREL) tablet 300 mg  300 mg Oral QHS Nicholaus Bloom, MD   300 mg at 03/15/15 2208    Lab Results: No results found for this or any previous visit (from the past 68 hour(s)).  Physical Findings: AIMS: Facial and Oral Movements Muscles of Facial Expression: None, normal Lips and Perioral Area: None, normal Jaw: None, normal Tongue: None, normal,Extremity Movements Upper (arms, wrists, hands, fingers): None, normal Lower (legs, knees, ankles, toes): None, normal, Trunk Movements Neck,  shoulders, hips: None, normal, Overall Severity Severity of abnormal movements (highest score from questions above): None, normal Incapacitation due to abnormal movements: None, normal Patient's awareness of abnormal movements (rate only patient's report): No Awareness, Dental Status Current problems with teeth and/or  dentures?: Yes Does patient usually wear dentures?: Yes  CIWA:  CIWA-Ar Total: 2 COWS:     Treatment Plan Summary: Daily contact with patient to assess and evaluate symptoms and progress in treatment and Medication management Supportive approach/copign skills Alcohol dependence; continue the Librium detox protocol Work a relapse prevention plan Depression; work on the grief/loss/add Prozac 10 mg daily Pain; will continue to work with the Neurontin CBT/mindfulness  Medical Decision Making:  Review of Psycho-Social Stressors (1), Review or order clinical lab tests (1) and Review of Medication Regimen & Side Effects (2)     Jace Fermin A 03/16/2015, 4:52 PM

## 2015-03-16 NOTE — BHH Group Notes (Signed)
   [facility] LCSW Aftercare Discharge Planning Group Note  03/16/2015  8:45 AM   Participation Quality: Alert, Appropriate and Oriented  Mood/Affect: Appropriate  Depression Rating: 3  Anxiety Rating: 3-4  Thoughts of Suicide: Pt denies SI/HI  Will you contract for safety? Yes  Current AVH: Pt denies  Plan for Discharge/Comments: Pt attended discharge planning group and actively participated in group. CSW provided pt with today's workbook. Patient reports feeling "alright", reports that he did not sleep well last night due to dreaming about his son. CSW encouraged patient to call [facility] today.  Transportation Means: Pt reports access to transportation  Supports: No supports mentioned at this time  Tilden Fossa, MSW, [place] Social Worker [employer] [phone]

## 2015-03-17 NOTE — Progress Notes (Addendum)
Providing support with Samuel Reyes around multiple losses, complicated grief, ETOH.    Samuel Reyes described feeling stuck in grief around several losses - loss of two sons, loss of spouse, loss of function due to several surgeries.  States he has coped through ETOH, using this to manage pain of loss, and this has isolated him from his family.  Has one daughter whom he does not see.  Lives with two roommates with which he does not have good relationship.  Samuel Reyes described having visions of his son, isolating himself in his room when he is becoming overwhelmed.  Described feeling "guilt" around losses, stating "I wish there was something different I could have done."  Chaplain worked with Samuel Reyes to normalize grief response, offer context for feelings of guilt.  Samuel Reyes identified helpfulness of sharing his loss with others, stating "I am feeling better being around people who don't think I just need to get over it."  Hopes to find a similar environment outside of [facility] - spoke with chaplain about hospice grief support groups.  Provided space to eulogize family, as Samuel Reyes identified not having context to share who they were for him.     Transportation is a significant barrier for Samuel Reyes receiving follow up.  Stated his landlord had been giving him rides to treatment previously, but he feels he is not able to go back to this place.   Chaplain called to emergency situation.  Samuel Reyes states he would be appreciative of chaplain follow up.    Samuel Reyes, [facility]

## 2015-03-17 NOTE — Progress Notes (Signed)
Patient attended Karaoke group tonight.

## 2015-03-17 NOTE — Progress Notes (Signed)
D: Patient in the dayroom on approach.  Patient states he had a good day today.  Patient states he has tried to attend all groups today.  Patient states he was also happy to speak with the chaplain today.  Patient states he hopes to speak with the chaplain again and work on getting set up with grief counseling when discharged. Patient denies SI/HI and denies AVH.  A: Staff to monitor Q 15 mins for safety.  Encouragement and support offered.  Scheduled medications administered per orders. R: Patient remains safe on the unit.  Patient attended group tonight.  Patient visible on the unit and interacting with peers.  Patient taking administered medications.

## 2015-03-17 NOTE — Progress Notes (Signed)
[facility] MD Progress Note  03/17/2015 4:15 PM Samuel Reyes  MRN:  [id number] Subjective:  Samuel Reyes states that he is wanting to get his life back together. He is going into the anniversary of his wife's death in 2023-05-26 and his sons' death in June 26, 2023. He states he has not really grief their deaths. He has not been back to visit his wife's grave since she died. States he copes by drinking. Does admit it is not working anymore. He is not sure where to go from here. He states he cant go back to where he was as he knows he will be going back to drinking Principal Problem: Alcohol dependence with uncomplicated withdrawal Diagnosis:   Patient Active Problem List   Diagnosis Date Noted  . Hypochloremia [E87.8]   . Epigastric pain [R10.13]   . Hepatic steatosis [K76.0] 02/05/2015  . Abdominal pain [R10.9] 02/05/2015  . Pressure ulcer [L89.90] 02/05/2015  . Protein-calorie malnutrition, severe [E43] 01/19/2015  . Seizures [R56.9] 01/19/2015  . New onset seizure [R56.9]   . Chest pain [R07.9] 01/05/2015  . Suicidal ideation [R45.851] 01/05/2015  . GERD without esophagitis [K21.9] 01/05/2015  . Persistent vomiting [R11.10] 08/31/2014  . HCAP (healthcare-associated pneumonia) [J18.9] 08/31/2014  . Hypokalemia [E87.6] 08/31/2014  . Hyponatremia [E87.1] 08/31/2014  . Major depressive disorder, recurrent, severe without psychotic features [F33.2]   . Alcohol dependence with uncomplicated withdrawal [K44.010]   . Suicidal ideations [R45.851] 07/21/2014  . Drug overdose [T50.901A] 07/21/2014  . Fever [R50.9]   . Overdose [T50.901A] 07/20/2014  . S/P alcohol detoxification [Z09] 04/15/2014  . Alcohol dependence [F10.20] 03/09/2014  . Major depression, recurrent [F33.9] 03/09/2014  . PTSD (post-traumatic stress disorder) [F43.10] 03/09/2014  . Recurrent ventral incisional hernia [K43.2] 01/20/2014  . Incisional hernia, without obstruction or gangrene [K43.2] 11/26/2013  . Colon polyps [K63.5]  11/26/2013  . DJD (degenerative joint disease) [M19.90] 10/28/2013  . Smoking [Z72.0] 10/28/2013  . Knee pain [M25.569] 06/15/2013  . Essential hypertension [I10] 02/17/2013  . FH: lung cancer [Z80.1] 02/17/2013  . COPD (chronic obstructive pulmonary disease) [J44.9] 02/17/2013  . Smoker [Z72.0] 02/17/2013  . GERD (gastroesophageal reflux disease) [K21.9] 02/17/2013  . Hiatal hernia [K44.9] 02/17/2013  . Bereavement [Z63.4] 02/02/2013   Total Time spent with patient: 30 minutes   Past Medical History:  Past Medical History  Diagnosis Date  . Cirrhosis   . Degenerative joint disease   . Hypertension   . Gastric ulcer   . Depression   . Asthma   . Mitral valve prolapse 2002  . H/O hiatal hernia   . COPD (chronic obstructive pulmonary disease)   . TIA (transient ischemic attack)     2010  . Stroke     TIA - 2010 - no deficits   . Shortness of breath     with exertion   . GERD (gastroesophageal reflux disease)   . Headache(784.0)   . Hiatal hernia 1982  . Degenerative joint disease   . Anxiety   . Alcohol abuse     Past Surgical History  Procedure Laterality Date  . Gastrectomy    . Shoulder surgery Bilateral     3 surgeries on on left, 2 surgeries on right   . Rt knee arthroscopic surgery    . Back surgery      3 cervical spine surgeries C4-C5 fused  . Hernia repair    . Finger surgery Left     2nd, 3rd, & 4th fingers were cut off by table saw and reattached  .  Colonoscopy N/A 01/04/2014    Procedure: COLONOSCOPY;  Surgeon: Danie Binder, MD;  [facility];  Service: Endoscopy;  Laterality: N/A;  1:45  . Esophagogastroduodenoscopy N/A 01/04/2014    Procedure: ESOPHAGOGASTRODUODENOSCOPY (EGD);  Surgeon: Danie Binder, MD;  [facility];  Service: Endoscopy;  Laterality: N/A;  . Incisional hernia repair N/A 01/20/2014    Procedure: LAPAROSCOPIC RECURRENT  INCISIONAL HERNIA with mesh;  Surgeon: Edward Jolly, MD;  Location: [facility];  Service:  General;  Laterality: N/A;   Family History:  Family History  Problem Relation Age of Onset  . Cancer Father     bone  . Cancer Brother     lungs  . Stroke Maternal Grandmother   . Colon cancer Neg Hx   . Asthma Son     died at age 43 in his sleep   . Spina bifida Son     died at age 52    Social History:  History  Alcohol Use  . 3.6 oz/week  . 6 Cans of beer per week    Comment: 1 case of beer and 1 shot of vodka between 1030am-1430pm     History  Drug Use No    Comment: denid using any drugs    Social History   Social History  . Marital Status: Widowed    Spouse Name: N/A  . Number of Children: N/A  . Years of Education: N/A   Occupational History  . Disability    Social History Main Topics  . Smoking status: Current Every Day Smoker -- 1.00 packs/day for 45 years    Types: Cigarettes    Start date: 07/30/1966  . Smokeless tobacco: Never Used  . Alcohol Use: 3.6 oz/week    6 Cans of beer per week     Comment: 1 case of beer and 1 shot of vodka between 1030am-1430pm  . Drug Use: No     Comment: denid using any drugs  . Sexual Activity: Not Currently   Other Topics Concern  . None   Social History Narrative   Additional History:    Sleep: Fair  Appetite:  Fair   Assessment:   Musculoskeletal: Strength & Muscle Tone: within normal limits Gait & Station: normal Patient leans: normal   Psychiatric Specialty Exam: Physical Exam  Review of Systems  Constitutional: Negative.   Eyes: Negative.   Respiratory: Negative.   Cardiovascular: Negative.   Gastrointestinal: Negative.   Genitourinary: Negative.   Musculoskeletal: Positive for neck pain.  Skin: Negative.   Neurological: Negative.   Endo/Heme/Allergies: Negative.   Psychiatric/Behavioral: Positive for depression and substance abuse. The patient is nervous/anxious.     Blood pressure 103/68, pulse 89, temperature 97.6 F (36.4 C), temperature source Oral, resp. rate 16, height 5' 2.5"  (1.588 m), weight 64.071 kg (141 lb 4 oz).Body mass index is 25.41 kg/(m^2).  General Appearance: Fairly Groomed  Engineer, water::  Fair  Speech:  Clear and Coherent  Volume:  Decreased  Mood:  Anxious and Depressed  Affect:  anxious worried  Thought Process:  Coherent and Goal Directed  Orientation:  Full (Time, Place, and Person)  Thought Content:  symptoms events worries concerns  Suicidal Thoughts:  No  Homicidal Thoughts:  No  Memory:  Immediate;   Fair Recent;   Fair Remote;   Fair  Judgement:  Fair  Insight:  Present  Psychomotor Activity:  Restlessness  Concentration:  Fair  Recall:  [employer] of Knowledge:Fair  Language: Fair  Akathisia:  No  Handed:  Right  AIMS (if indicated):     Assets:  Desire for Improvement  ADL's:  Intact  Cognition: WNL  Sleep:  Number of Hours: 7.45     Current Medications: Current Facility-Administered Medications  Medication Dose Route Frequency Provider Last Rate Last Dose  . acetaminophen (TYLENOL) tablet 650 mg  650 mg Oral Q6H PRN Patrecia Pour, NP   650 mg at 03/17/15 1517  . alum & mag hydroxide-simeth (MAALOX/MYLANTA) 200-200-20 MG/5ML suspension 30 mL  30 mL Oral Q4H PRN Patrecia Pour, NP      . chlordiazePOXIDE (LIBRIUM) capsule 25 mg  25 mg Oral Q6H PRN Laverle Hobby, PA-C      . chlordiazePOXIDE (LIBRIUM) capsule 25 mg  25 mg Oral BH-qamhs Laverle Hobby, PA-C       Followed by  . [START ON 03/19/2015] chlordiazePOXIDE (LIBRIUM) capsule 25 mg  25 mg Oral Daily Laverle Hobby, PA-C      . FLUoxetine (PROZAC) capsule 10 mg  10 mg Oral Daily Nicholaus Bloom, MD   10 mg at 03/17/15 0756  . folic acid (FOLVITE) tablet 1 mg  1 mg Oral Daily Patrecia Pour, NP   1 mg at 03/17/15 0756  . gabapentin (NEURONTIN) capsule 300 mg  300 mg Oral BID Patrecia Pour, NP   300 mg at 03/17/15 0757  . Ipratropium-Albuterol (COMBIVENT) respimat 2 puff  2 puff Inhalation BID Patrecia Pour, NP   2 puff at 03/17/15 0757  . magnesium hydroxide  (MILK OF MAGNESIA) suspension 30 mL  30 mL Oral Daily PRN Patrecia Pour, NP      . metoprolol tartrate (LOPRESSOR) tablet 25 mg  25 mg Oral QAC breakfast Encarnacion Slates, NP   25 mg at 03/17/15 6160  . multivitamin with minerals tablet 1 tablet  1 tablet Oral Daily Patrecia Pour, NP   1 tablet at 03/17/15 0756  . nicotine (NICODERM CQ - dosed in mg/24 hours) patch 21 mg  21 mg Transdermal Daily Nicholaus Bloom, MD   21 mg at 03/17/15 7371  . pantoprazole (PROTONIX) EC tablet 40 mg  40 mg Oral Daily Patrecia Pour, NP   40 mg at 03/17/15 0756  . thiamine (VITAMIN B-1) tablet 100 mg  100 mg Oral Daily Patrecia Pour, NP   100 mg at 03/17/15 0756  . traZODone (DESYREL) tablet 100 mg  100 mg Oral Once Laverle Hobby, PA-C   100 mg at 03/14/15 2315  . traZODone (DESYREL) tablet 300 mg  300 mg Oral QHS Nicholaus Bloom, MD   300 mg at 03/16/15 2148    Lab Results: No results found for this or any previous visit (from the past 48 hour(s)).  Physical Findings: AIMS: Facial and Oral Movements Muscles of Facial Expression: None, normal Lips and Perioral Area: None, normal Jaw: None, normal Tongue: None, normal,Extremity Movements Upper (arms, wrists, hands, fingers): None, normal Lower (legs, knees, ankles, toes): None, normal, Trunk Movements Neck, shoulders, hips: None, normal, Overall Severity Severity of abnormal movements (highest score from questions above): None, normal Incapacitation due to abnormal movements: None, normal Patient's awareness of abnormal movements (rate only patient's report): No Awareness, Dental Status Current problems with teeth and/or dentures?: Yes Does patient usually wear dentures?: Yes  CIWA:  CIWA-Ar Total: 0 COWS:     Treatment Plan Summary: Daily contact with patient to assess and evaluate symptoms  and progress in treatment and Medication management Supportive approach/coping skills Alcohol dependence; continue the Librium detox protocol Work a relapse  prevention plan Depression; work with his unresolved grief/continue Prozac 10 mg consider increasing to 20 mg Explore residential treatment options vs. [facility] CBT/mindfulness  Medical Decision Making:  Review of Psycho-Social Stressors (1) and Review of Medication Regimen & Side Effects (2)     Sven Pinheiro A 03/17/2015, 4:15 PM

## 2015-03-17 NOTE — Tx Team (Signed)
Interdisciplinary Treatment Plan Update (Adult) Date: 03/17/2015    Time Reviewed: 9:30 AM  Progress in Treatment: Attending groups: Yes Participating in groups: Yes Taking medication as prescribed: Yes Tolerating medication: Yes Family/Significant other contact made: No, 2 failed attempts to contact friend Patient understands diagnosis: Yes Discussing patient identified problems/goals with staff: Yes Medical problems stabilized or resolved: Yes Denies suicidal/homicidal ideation: Yes Issues/concerns per patient self-inventory: Yes Other:  New problem(s) identified: N/A  Discharge Plan or Barriers: 03/17/2015:  Patient interested in going to a recovery house at discharge. He has been provided with [facility] listing. He plans to resume services with [facility].  Reason for Continuation of Hospitalization:  Depression Anxiety Medication Stabilization   Comments: N/A  Estimated length of stay: 1-2 days   Patient is a 59 y.o. male with increasing depression over the past month & SI. Patient reports a past suicide attempt by cutting his wrists. He states that he owns 4 guns but his mother has his guns and he does not know what she has done with them. Reports being at risk for homelessness. He drinks 2-40 oz beers daily. Last here in January 2016. Patient will benefit from crisis stabilization, medication evaluation, group therapy, and psycho education in addition to case management for discharge planning. Patient and CSW reviewed pt's identified goals and treatment plan. Pt verbalized understanding and agreed to treatment plan.    Review of initial/current patient goals per problem list:  1. Goal(s): Patient will participate in aftercare plan   Met: Goal Progressing.   Target date: 3-5 days post admission date   As evidenced by: Patient will participate within aftercare plan AEB aftercare provider and housing plan at discharge being identified.  03/15/15: Goal not met: CSW  assessing for appropriate referrals for pt and will have follow up secured prior to d/c. 8/18: Patient plans to go to recovery house, has been provided with [employer]. Plans to resume services at [facility].     2. Goal (s): Patient will exhibit decreased depressive symptoms and suicidal ideations.   Met: Yes   Target date: 3-5 days post admission date   As evidenced by: Patient will utilize self rating of depression at 3 or below and demonstrate decreased signs of depression or be deemed stable for discharge by MD.  8/16: Goal not met: Pt presents with flat affect and depressed mood.  Pt admitted with depression rating of 10.  Pt to show decreased sign of depression and a rating of 3 or less before d/c.   8/18: Goal met: Patient rates depression at 3 and denies SI.     3. Goal(s): Patient will demonstrate decreased signs and symptoms of anxiety.   Met: Yes   Target date: 3-5 days post admission date   As evidenced by: Patient will utilize self rating of anxiety at 3 or below and demonstrated decreased signs of anxiety, or be deemed stable for discharge by MD  8/16: Goal not met: Pt presents with anxious mood and affect.  Pt admitted with anxiety rating of 10.  Pt to show decreased sign of anxiety and a rating of 3 or less before d/c. 8/18:  Goal met: Patient rates anxiety at 3 today.     4. Goal(s): Patient will demonstrate decreased signs of withdrawal due to substance abuse   Met: Yes   Target date: 3-5 days post admission date   As evidenced by: Patient will produce a CIWA/COWS score of 0, have stable vitals signs, and no symptoms  of withdrawal  03/17/2015: Goal met: No withdrawal symptoms reported at this time per medical chart.    Attendees: Patient:    Family:    Physician: Dr. Parke Poisson; Dr. Sabra Heck 03/17/2015 9:30 AM  Nursing: Mayra Neer, Patty Duke, Fredda Hammed [facility] 03/17/2015 9:30 AM  Clinical Social Worker: Tilden Fossa,  [place] 03/17/2015 9:30 AM  Other: Peri Maris, [place], LCSWA 03/17/2015 9:30 AM  Other:  03/17/2015 9:30 AM  Other:  03/17/2015 9:30 AM  Other: Samuel Jester, NP 03/17/2015 9:30 AM  Other:    Other:    Other:      Scribe for Treatment Team:  Tilden Fossa, MSW, [place] [phone]

## 2015-03-17 NOTE — Progress Notes (Signed)
D: Patient visible on the milieu.  He reports minimal withdrawal symptoms.  He is attend groups and participating in his treatment.  He denies SI/HI/AVH.  Patient is sleeping and eating well.  His goal today is "to get better and listen well."  He rates his depression and anxiety as a 3.  Patient is awaiting consult with the Chaplain this afternoon to discuss his unresolved grief. A: Continue to monitor medication management and MD orders.  Safety checks completed every 15 minutes per protocol.  Offer support and encouragement as needed. R: Patient's behavior is appropriate to situation.

## 2015-03-17 NOTE — [facility] Group Notes (Signed)
[facility] Group Notes:  (Nursing/MHT/Case Management/Adjunct)  Date:  03/17/2015  Time:   0930 Type of Therapy:  Nurse Education  /  Leisure Group : The group is focused on helping patients understand the importance of  Incorporating leisure time into their lives on a daily basis.  Participation Level:  Active  Participation Quality:  Appropriate  Affect:  Blunted  Cognitive:  Oriented  Insight:  Appropriate  Engagement in Group:  Engaged  Modes of Intervention:  Discussion  Summary of Progress/Problems:  Samuel Reyes 03/17/2015, 10:40 AM

## 2015-03-17 NOTE — Progress Notes (Signed)
Nutrition Education Note  RD led a group providing general, healthful nutrition education.  RD emphasized the importance of eating regular meals and snacks throughout the day. Consuming sugar-free beverages and incorporating fruits and vegetables into diet when possible. Provided examples of healthy snacks. Encouraged physical activity for at least 60 minutes a day. Patient encouraged to leave group with a goal to improve nutrition/healthy eating.   Expect good compliance.  Diet Order: Diet Heart Room service appropriate?: Yes; Fluid consistency:: Thin Pt is also offered choice of unit snacks mid-morning and mid-afternoon.  Pt is eating as desired.   Labs and medications reviewed. If additional nutrition issues arise, please consult RD.  Clayton Bibles, MS, RD, LDN Pager: [phone] After Hours Pager: [phone]

## 2015-03-17 NOTE — [facility] Group Notes (Signed)
[place] LCSW Group Therapy 03/17/2015 1:15 PM Type of Therapy: Group Therapy Participation Level: Active  Participation Quality: Attentive, Sharing and Supportive  Affect: Appropriate  Cognitive: Alert and Oriented  Insight: Developing/Improving and Engaged  Engagement in Therapy: Developing/Improving and Engaged  Modes of Intervention: Activity, Clarification, Confrontation, Discussion, Education, Exploration, Limit-setting, Orientation, Problem-solving, Rapport Building, Art therapist, Socialization and Support  Summary of Progress/Problems: Patient was attentive and engaged with speaker from [place]. Patient was attentive to speaker while they shared their story of dealing with mental health and overcoming it. Patient expressed interest in their programs and services and received information on their agency. Patient processed ways they can relate to the speaker.   Tilden Fossa, MSW, [place] Worker [facility] [phone]

## 2015-03-18 MED ORDER — OMEPRAZOLE 40 MG PO CPDR: 40.0000 mg | DELAYED_RELEASE_CAPSULE | Freq: Every day | ORAL | Status: DC

## 2015-03-18 MED ORDER — NICOTINE 21 MG/24HR TD PT24: 21.0000 mg | MEDICATED_PATCH | Freq: Every day | TRANSDERMAL | Status: DC

## 2015-03-18 MED ORDER — FLUOXETINE HCL 10 MG PO CAPS: 10.0000 mg | ORAL_CAPSULE | Freq: Every day | ORAL | Status: DC

## 2015-03-18 MED ORDER — TRAZODONE HCL 100 MG PO TABS: 100.0000 mg | ORAL_TABLET | Freq: Once | ORAL | Status: DC

## 2015-03-18 MED ORDER — METOPROLOL TARTRATE 25 MG PO TABS: 25.0000 mg | ORAL_TABLET | Freq: Every day | ORAL | Status: DC

## 2015-03-18 MED ORDER — GABAPENTIN 300 MG PO CAPS: 300.0000 mg | ORAL_CAPSULE | Freq: Two times a day (BID) | ORAL | Status: DC

## 2015-03-18 NOTE — Discharge Summary (Signed)
Physician Discharge Summary Note  Patient:  Samuel Reyes is an 59 y.o., male MRN:  [id number] DOB:  1956/01/24 Patient phone:  [phone] (home)  Patient address:   [address] [place] [address],  Total Time spent with patient: 45 minutes  Date of Admission:  03/14/2015 Date of Discharge: 03/18/2015  Reason for Admission:  Substance abuse  Principal Problem: Alcohol dependence with uncomplicated withdrawal Discharge Diagnoses: Patient Active Problem List   Diagnosis Date Noted  . Hypochloremia [E87.8]   . Epigastric pain [R10.13]   . Hepatic steatosis [K76.0] 02/05/2015  . Abdominal pain [R10.9] 02/05/2015  . Pressure ulcer [L89.90] 02/05/2015  . Protein-calorie malnutrition, severe [E43] 01/19/2015  . Seizures [R56.9] 01/19/2015  . New onset seizure [R56.9]   . Chest pain [R07.9] 01/05/2015  . Suicidal ideation [R45.851] 01/05/2015  . GERD without esophagitis [K21.9] 01/05/2015  . Persistent vomiting [R11.10] 08/31/2014  . HCAP (healthcare-associated pneumonia) [J18.9] 08/31/2014  . Hypokalemia [E87.6] 08/31/2014  . Hyponatremia [E87.1] 08/31/2014  . Major depressive disorder, recurrent, severe without psychotic features [F33.2]   . Alcohol dependence with uncomplicated withdrawal [A25.053]   . Suicidal ideations [R45.851] 07/21/2014  . Drug overdose [T50.901A] 07/21/2014  . Fever [R50.9]   . Overdose [T50.901A] 07/20/2014  . S/P alcohol detoxification [Z09] 04/15/2014  . Alcohol dependence [F10.20] 03/09/2014  . Major depression, recurrent [F33.9] 03/09/2014  . PTSD (post-traumatic stress disorder) [F43.10] 03/09/2014  . Recurrent ventral incisional hernia [K43.2] 01/20/2014  . Incisional hernia, without obstruction or gangrene [K43.2] 11/26/2013  . Colon polyps [K63.5] 11/26/2013  . DJD (degenerative joint disease) [M19.90] 10/28/2013  . Smoking [Z72.0] 10/28/2013  . Knee pain [M25.569] 06/15/2013  . Essential hypertension [I10] 02/17/2013  . FH: lung  cancer [Z80.1] 02/17/2013  . COPD (chronic obstructive pulmonary disease) [J44.9] 02/17/2013  . Smoker [Z72.0] 02/17/2013  . GERD (gastroesophageal reflux disease) [K21.9] 02/17/2013  . Hiatal hernia [K44.9] 02/17/2013  . Bereavement [Z63.4] 02/02/2013    Musculoskeletal: Strength & Muscle Tone: within normal limits Gait & Station: normal Patient leans: N/A  Psychiatric Specialty Exam: Physical Exam  Vitals reviewed. Psychiatric: His mood appears anxious. He does not exhibit a depressed mood.    Review of Systems  All other systems reviewed and are negative.   Blood pressure 111/73, pulse 78, temperature 98.2 F (36.8 C), temperature source Oral, resp. rate 16, height 5' 2.5" (1.588 m), weight 64.071 kg (141 lb 4 oz).Body mass index is 25.41 kg/(m^2).   General Appearance: Fairly Groomed  Engineer, water:: Fair  Speech: Clear and Coherent  Volume: Normal  Mood: Euthymic  Affect: Appropriate  Thought Process: Coherent and Goal Directed  Orientation: Full (Time, Place, and Person)  Thought Content: plans as he moves on, relapse prevention plan  Suicidal Thoughts: No  Homicidal Thoughts: No  Memory: Immediate; Fair Recent; Fair Remote; Fair  Judgement: Fair  Insight: Present  Psychomotor Activity: Normal  Concentration: Fair  Recall: [employer] of Knowledge:Fair  Language: Fair  Akathisia: No  Handed: Right  AIMS (if indicated):    Assets: Desire for Improvement  Sleep: Number of Hours: 7.45  Cognition: WNL  ADL's: Intact       Have you used any form of tobacco in the last 30 days? (Cigarettes, Smokeless Tobacco, Cigars, and/or Pipes): Yes  Has this patient used any form of tobacco in the last 30 days? (Cigarettes, Smokeless Tobacco, Cigars, and/or Pipes) N/A  Past Medical History:  Past Medical History  Diagnosis Date  . Cirrhosis   . Degenerative  joint disease   . Hypertension   . Gastric ulcer   .  Depression   . Asthma   . Mitral valve prolapse 2002  . H/O hiatal hernia   . COPD (chronic obstructive pulmonary disease)   . TIA (transient ischemic attack)     2010  . Stroke     TIA - 2010 - no deficits   . Shortness of breath     with exertion   . GERD (gastroesophageal reflux disease)   . Headache(784.0)   . Hiatal hernia 1982  . Degenerative joint disease   . Anxiety   . Alcohol abuse     Past Surgical History  Procedure Laterality Date  . Gastrectomy    . Shoulder surgery Bilateral     3 surgeries on on left, 2 surgeries on right   . Rt knee arthroscopic surgery    . Back surgery      3 cervical spine surgeries C4-C5 fused  . Hernia repair    . Finger surgery Left     2nd, 3rd, & 4th fingers were cut off by table saw and reattached  . Colonoscopy N/A 01/04/2014    Procedure: COLONOSCOPY;  Surgeon: Danie Binder, MD;  Location: [facility];  Service: Endoscopy;  Laterality: N/A;  1:45  . Esophagogastroduodenoscopy N/A 01/04/2014    Procedure: ESOPHAGOGASTRODUODENOSCOPY (EGD);  Surgeon: Danie Binder, MD;  Location: [facility];  Service: Endoscopy;  Laterality: N/A;  . Incisional hernia repair N/A 01/20/2014    Procedure: LAPAROSCOPIC RECURRENT  INCISIONAL HERNIA with mesh;  Surgeon: Edward Jolly, MD;  Location: [facility];  Service: General;  Laterality: N/A;   Family History:  Family History  Problem Relation Age of Onset  . Cancer Father     bone  . Cancer Brother     lungs  . Stroke Maternal Grandmother   . Colon cancer Neg Hx   . Asthma Son     died at age 70 in his sleep   . Spina bifida Son     died at age 71    Social History:  History  Alcohol Use  . 3.6 oz/week  . 6 Cans of beer per week    Comment: 1 case of beer and 1 shot of vodka between 1030am-1430pm     History  Drug Use No    Comment: denid using any drugs    Social History   Social History  . Marital Status: Widowed    Spouse Name: N/A  . Number of Children: N/A  . Years  of Education: N/A   Occupational History  . Disability    Social History Main Topics  . Smoking status: Current Every Day Smoker -- 1.00 packs/day for 45 years    Types: Cigarettes    Start date: 07/30/1966  . Smokeless tobacco: Never Used  . Alcohol Use: 3.6 oz/week    6 Cans of beer per week     Comment: 1 case of beer and 1 shot of vodka between 1030am-1430pm  . Drug Use: No     Comment: denid using any drugs  . Sexual Activity: Not Currently   Other Topics Concern  . None   Social History Narrative   Risk to Self: Is patient at risk for suicide?: Yes Risk to Others:   Prior Inpatient Therapy:   Prior Outpatient Therapy:    Level of Care:  OP  Hospital Course:  Kenneith Stief was admitted for Alcohol dependence with uncomplicated  withdrawal and crisis management.  He was treated discharged with the medications listed below under Medication List.  Medical problems were identified and treated as needed.  Home medications were restarted as appropriate.  Improvement was monitored by observation and Scherrie Merritts daily report of symptom reduction.  Emotional and mental status was monitored by daily self-inventory reports completed by Scherrie Merritts and clinical staff.         Burns Timson was evaluated by the treatment team for stability and plans for continued recovery upon discharge.  Vitaly Wanat motivation was an integral factor for scheduling further treatment.  Employment, transportation, bed availability, health status, family support, and any pending legal issues were also considered during his hospital stay.  He was offered further treatment options upon discharge including but not limited to Residential, Intensive Outpatient, and Outpatient treatment.  Onis Markoff will follow up with the services as listed below under Follow Up Information.     Upon completion of this admission the patient was both mentally and medically stable for discharge denying  suicidal/homicidal ideation, auditory/visual/tactile hallucinations, delusional thoughts and paranoia.      Consults:  psychiatry  Significant Diagnostic Studies:  labs: per ED  Discharge Vitals:   Blood pressure 111/73, pulse 78, temperature 98.2 F (36.8 C), temperature source Oral, resp. rate 16, height 5' 2.5" (1.588 m), weight 64.071 kg (141 lb 4 oz). Body mass index is 25.41 kg/(m^2). Lab Results:   No results found for this or any previous visit (from the past 72 hour(s)).  Physical Findings: AIMS: Facial and Oral Movements Muscles of Facial Expression: None, normal Lips and Perioral Area: None, normal Jaw: None, normal Tongue: None, normal,Extremity Movements Upper (arms, wrists, hands, fingers): None, normal Lower (legs, knees, ankles, toes): None, normal, Trunk Movements Neck, shoulders, hips: None, normal, Overall Severity Severity of abnormal movements (highest score from questions above): None, normal Incapacitation due to abnormal movements: None, normal Patient's awareness of abnormal movements (rate only patient's report): No Awareness, Dental Status Current problems with teeth and/or dentures?: Yes Does patient usually wear dentures?: Yes  CIWA:  CIWA-Ar Total: 0 COWS:      See Psychiatric Specialty Exam and Suicide Risk Assessment completed by Attending Physician prior to discharge.  Discharge destination:  Home  Is patient on multiple antipsychotic therapies at discharge:  No   Has Patient had three or more failed trials of antipsychotic monotherapy by history:  No  Recommended Plan for Multiple Antipsychotic Therapies: NA     Medication List    STOP taking these medications        acamprosate 333 MG tablet  Commonly known as:  CAMPRAL     albuterol 108 (90 BASE) MCG/ACT inhaler  Commonly known as:  PROVENTIL HFA;VENTOLIN HFA     albuterol-ipratropium 18-103 MCG/ACT inhaler  Commonly known as:  COMBIVENT     feeding supplement (ENSURE ENLIVE)  Liqd     folic acid 1 MG tablet  Commonly known as:  FOLVITE      TAKE these medications      Indication   FLUoxetine 10 MG capsule  Commonly known as:  PROZAC  Take 1 capsule (10 mg total) by mouth daily.   Indication:  Major Depressive Disorder     gabapentin 300 MG capsule  Commonly known as:  NEURONTIN  Take 1 capsule (300 mg total) by mouth 2 (two) times daily.   Indication:  Agitation, Neuropathic Pain, Substance withdrawal syndrome     metoprolol tartrate 25 MG tablet  Commonly known as:  LOPRESSOR  Take 1 tablet (25 mg total) by mouth daily before breakfast.   Indication:  High Blood Pressure     multivitamin with minerals Tabs tablet  Take 1 tablet by mouth daily.      nicotine 21 mg/24hr patch  Commonly known as:  NICODERM CQ - dosed in mg/24 hours  Place 1 patch (21 mg total) onto the skin daily.   Indication:  Nicotine Addiction     omeprazole 40 MG capsule  Commonly known as:  PRILOSEC  Take 1 capsule (40 mg total) by mouth daily.   Indication:  Gastroesophageal Reflux Disease     traZODone 100 MG tablet  Commonly known as:  DESYREL  Take 1 tablet (100 mg total) by mouth once.   Indication:  Trouble Sleeping       Follow-up Information    Follow up with [facility].   Specialty:  Behavioral Health   Why:  Walk-in clinic Monday-Friday between 8am to 3pm for assessment for therapy and medication management services. Please go early in the morning and let them know if you are an established patient in order to be seen sooner.   Contact information:   [address] [place] [address] [phone]       Follow-up recommendations:  Activity:  as tol, diet as tol  Comments:  1.  Take all your medications as prescribed.              2.  Report any adverse side effects to outpatient provider.                       3.  Patient instructed to not use alcohol or illegal drugs while on prescription medicines.            4.  In the event of worsening symptoms,  instructed patient to call 911, the crisis hotline or go to nearest emergency room for evaluation of symptoms.  Total Discharge Time: 40 min  Signed: Freda Munro May Agustin AGNP-BC 03/18/2015, 5:38 PM  I personally assessed the patient and formulated the plan Geralyn Flash A. Sabra Heck, M.D.

## 2015-03-18 NOTE — Plan of Care (Signed)
Problem: Diagnosis: Increased Risk For Suicide Attempt Goal: STG-Patient Will Attend All Groups On The Unit Outcome: Progressing Patient has been attending groups on the unit.

## 2015-03-18 NOTE — [facility] Suicide Risk Assessment (Signed)
[facility] Discharge Suicide Risk Assessment   Demographic Factors:  Male and Caucasian  Total Time spent with patient: 30 minutes  Musculoskeletal: Strength & Muscle Tone: within normal limits Gait & Station: normal Patient leans: normal  Psychiatric Specialty Exam: Physical Exam  ROS  Blood pressure 111/73, pulse 78, temperature 98.2 F (36.8 C), temperature source Oral, resp. rate 16, height 5' 2.5" (1.588 m), weight 64.071 kg (141 lb 4 oz).Body mass index is 25.41 kg/(m^2).  General Appearance: Fairly Groomed  Engineer, water::  Fair  Speech:  Clear and [id number]  Volume:  Normal  Mood:  Euthymic  Affect:  Appropriate  Thought Process:  Coherent and Goal Directed  Orientation:  Full (Time, Place, and Person)  Thought Content:  plans as he moves on, relapse prevention plan  Suicidal Thoughts:  No  Homicidal Thoughts:  No  Memory:  Immediate;   Fair Recent;   Fair Remote;   Fair  Judgement:  Fair  Insight:  Present  Psychomotor Activity:  Normal  Concentration:  Fair  Recall:  [employer] of Knowledge:Fair  Language: Fair  Akathisia:  No  Handed:  Right  AIMS (if indicated):     Assets:  Desire for Improvement  Sleep:  Number of Hours: 7.45  Cognition: WNL  ADL's:  Intact   Have you used any form of tobacco in the last 30 days? (Cigarettes, Smokeless Tobacco, Cigars, and/or Pipes): Yes  Has this patient used any form of tobacco in the last 30 days? (Cigarettes, Smokeless Tobacco, Cigars, and/or Pipes) Yes, Prescription not provided because: will be given nicotine patches  Mental Status Per Nursing Assessment::   On Admission:    Current Mental Status by Physician: in full contact with reality. There are no active S/S of withdrawal. There are no active SI plans or intent. He is going to a half way house. He plans to pursue grief counseling   Loss Factors: Loss of significant relationship  Historical Factors: NA  Risk Reduction Factors:   Sense of responsibility to  family and Positive social support  Continued Clinical Symptoms: Substance Abuse/grief loss   Cognitive Features That Contribute To Risk:  Closed-mindedness, Polarized thinking and Thought constriction (tunnel vision)    Suicide Risk:  Minimal: No identifiable suicidal ideation.  Patients presenting with no risk factors but with morbid ruminations; may be classified as minimal risk based on the severity of the depressive symptoms  Principal Problem: Alcohol dependence with uncomplicated withdrawal Discharge Diagnoses:  Patient Active Problem List   Diagnosis Date Noted  . Hypochloremia [E87.8]   . Epigastric pain [R10.13]   . Hepatic steatosis [K76.0] 02/05/2015  . Abdominal pain [R10.9] 02/05/2015  . Pressure ulcer [L89.90] 02/05/2015  . Protein-calorie malnutrition, severe [E43] 01/19/2015  . Seizures [R56.9] 01/19/2015  . New onset seizure [R56.9]   . Chest pain [R07.9] 01/05/2015  . Suicidal ideation [R45.851] 01/05/2015  . GERD without esophagitis [K21.9] 01/05/2015  . Persistent vomiting [R11.10] 08/31/2014  . HCAP (healthcare-associated pneumonia) [J18.9] 08/31/2014  . Hypokalemia [E87.6] 08/31/2014  . Hyponatremia [E87.1] 08/31/2014  . Major depressive disorder, recurrent, severe without psychotic features [F33.2]   . Alcohol dependence with uncomplicated withdrawal [A21.308]   . Suicidal ideations [R45.851] 07/21/2014  . Drug overdose [T50.901A] 07/21/2014  . Fever [R50.9]   . Overdose [T50.901A] 07/20/2014  . S/P alcohol detoxification [Z09] 04/15/2014  . Alcohol dependence [F10.20] 03/09/2014  . Major depression, recurrent [F33.9] 03/09/2014  . PTSD (post-traumatic stress disorder) [F43.10] 03/09/2014  . Recurrent ventral incisional  hernia [K43.2] 01/20/2014  . Incisional hernia, without obstruction or gangrene [K43.2] 11/26/2013  . Colon polyps [K63.5] 11/26/2013  . DJD (degenerative joint disease) [M19.90] 10/28/2013  . Smoking [Z72.0] 10/28/2013  . Knee pain  [M25.569] 06/15/2013  . Essential hypertension [I10] 02/17/2013  . FH: lung cancer [Z80.1] 02/17/2013  . COPD (chronic obstructive pulmonary disease) [J44.9] 02/17/2013  . Smoker [Z72.0] 02/17/2013  . GERD (gastroesophageal reflux disease) [K21.9] 02/17/2013  . Hiatal hernia [K44.9] 02/17/2013  . Bereavement [Z63.4] 02/02/2013    Follow-up Information    Follow up with [facility].   Specialty:  Behavioral Health   Why:  Walk-in clinic Monday-Friday between 8am to 3pm for assessment for therapy and medication management services. Please go early in the morning and let them know if you are an established patient in order to be seen sooner.   Contact information:   [place] [address] [phone]       Plan Of Care/Follow-up recommendations:  Activity:  as tolerated Diet:  regular Follow up [facility] Is patient on multiple antipsychotic therapies at discharge:  No   Has Patient had three or more failed trials of antipsychotic monotherapy by history:  No  Recommended Plan for Multiple Antipsychotic Therapies: NA    Samuel Reyes A 03/18/2015, 1:03 PM

## 2015-03-18 NOTE — Progress Notes (Signed)
Patient is being discharged, denies SI/HI and A/V hallucinations. Patient states he feels much better and feels like this admission has helped him a lot more than a previous one because he put more work into it. Patient given 4 scripts as well as samples. Patient verbalized understanding of discharge instructions and follow up, no further questions at this time. All belongings returned to patient.   Talyah Seder, Thornton Dales, RN

## 2015-03-18 NOTE — Progress Notes (Signed)
D: Patient is A&Ox4, denies SI/HI and A/V hallucinations. Patient stated he doesn't sleep well, stated he falls asleep around midnight and wakes up every morning around 3-4am, stated he can't find any medicine to help him stay asleep.  Patient also spoke about the death of 2 children and his wife, as well as multiple surgeries and chronic pain from that. Patient presents with a flat affect and appears very depressed, but is attending groups and sitting in the dayroom with minimal interaction A: Patient given medications as scheduled and prn. Emotional support given to patient as needed. Continue monitoring patient q15 minutes and prn for safety. R: Patient remains safe. Patient verbalized understanding to find staff if he no longer feels safe or if anything changes.  Addyson Traub, Thornton Dales, RN

## 2015-03-18 NOTE — Tx Team (Signed)
Interdisciplinary Treatment Plan Update (Adult) Date: 03/18/2015    Time Reviewed: 9:30 AM  Progress in Treatment: Attending groups: Yes Participating in groups: Yes Taking medication as prescribed: Yes Tolerating medication: Yes Family/Significant other contact made: No, 2 failed attempts to contact friend. spe completed with pt.  Patient understands diagnosis: Yes Discussing patient identified problems/goals with staff: Yes Medical problems stabilized or resolved: Yes Denies suicidal/homicidal ideation: Yes Issues/concerns per patient self-inventory: Yes Other:  New problem(s) identified: N/A  Discharge Plan or Barriers: 03/18/2015:  Patient interested in going to a recovery house at discharge and has interview this weekend. He plans to stay with a friend until he can get into [facility] and go to [facility] for outpatient services.   Reason for Continuation of Hospitalization:  none  Comments: N/A  Estimated length of stay: d/c today.    Patient is a 59 y.o. male with increasing depression over the past month & SI. Patient reports a past suicide attempt by cutting his wrists. He states that he owns 4 guns but his mother has his guns and he does not know what she has done with them. Reports being at risk for homelessness. He drinks 2-40 oz beers daily. Last here in January 2016. Patient will benefit from crisis stabilization, medication evaluation, group therapy, and psycho education in addition to case management for discharge planning. Patient and CSW reviewed pt's identified goals and treatment plan. Pt verbalized understanding and agreed to treatment plan.  Review of initial/current patient goals per problem list:  1. Goal(s): Patient will participate in aftercare plan   Met: Goal met   Target date: 3-5 days post admission date   As evidenced by: Patient will participate within aftercare plan AEB aftercare provider and housing plan at discharge being  identified.  03/15/15: Goal not met: CSW assessing for appropriate referrals for pt and will have follow up secured prior to d/c. 8/18: Patient plans to go to recovery house, has been provided with [employer]. Plans to resume services at [facility].  8/19: Goal met. Pt will stay with friend until he gets into [facility]-interview scheduled for this weekend. [facility] to [facility].   2. Goal (s): Patient will exhibit decreased depressive symptoms and suicidal ideations.   Met: Yes   Target date: 3-5 days post admission date   As evidenced by: Patient will utilize self rating of depression at 3 or below and demonstrate decreased signs of depression or be deemed stable for discharge by MD.  8/16: Goal not met: Pt presents with flat affect and depressed mood.  Pt admitted with depression rating of 10.  Pt to show decreased sign of depression and a rating of 3 or less before d/c.   8/18: Goal met: Patient rates depression at 3 and denies SI.     3. Goal(s): Patient will demonstrate decreased signs and symptoms of anxiety.   Met: Yes   Target date: 3-5 days post admission date   As evidenced by: Patient will utilize self rating of anxiety at 3 or below and demonstrated decreased signs of anxiety, or be deemed stable for discharge by MD  8/16: Goal not met: Pt presents with anxious mood and affect.  Pt admitted with anxiety rating of 10.  Pt to show decreased sign of anxiety and a rating of 3 or less before d/c. 8/18:  Goal met: Patient rates anxiety at 3 today.     4. Goal(s): Patient will demonstrate decreased signs of withdrawal due to substance  abuse   Met: Yes   Target date: 3-5 days post admission date   As evidenced by: Patient will produce a CIWA/COWS score of 0, have stable vitals signs, and no symptoms of withdrawal  03/18/2015: Goal met: No withdrawal symptoms reported at this time per medical chart.    Attendees: Patient:    Family:     Physician: Dr. Sabra Heck 03/18/2015 10:08 AM   Nursing: Gaspar Cola RN   Clinical Social Worker: Tilden Fossa,  LCSWA   Other: Peri Maris, Maxie Better, LCSWA   Other:    Other:    Other: Samuel Jester, NP   Other:    Other:    Other:      Scribe for Treatment Team:  Maxie Better, [place] 03/18/2015 10:09 AM

## 2015-03-18 NOTE — Progress Notes (Signed)
  [facility] Adult Case Management Discharge Plan :  Will you be returning to the same living situation after discharge:  Yes,  home with friend until he gets into [facility]-he has interview on Saturday at local [facility] At discharge, do you have transportation home?: Yes,  bus pass Do you have the ability to pay for your medications: Yes,  mental health  Release of information consent forms completed and submitted to Medical Records by CSW.  Patient to Follow up at: Follow-up Information    Follow up with [facility].   Specialty:  Behavioral Health   Why:  Walk-in clinic Monday-Friday between 8am to 3pm for assessment for therapy and medication management services. Please go early in the morning and let them know if you are an established patient in order to be seen sooner.   Contact information:   [place] [address] [phone]       Patient denies SI/HI: Yes,  during group/self report    Safety Planning and Suicide Prevention discussed: Yes,  2 contact attempts made with pt's friend. SPE completed with pt. pt given SPI pamphlet and encouraged to share information with support network, ask questions, and talk about any concerns relating to SPE.  Have you used any form of tobacco in the last 30 days? (Cigarettes, Smokeless Tobacco, Cigars, and/or Pipes): Yes  Has patient been referred to the Quitline?: Patient refused referral  Smart, Alicia Amel 03/18/2015, 10:09 AM

## 2015-04-06 ENCOUNTER — Inpatient Hospital Stay: Payer: Self-pay | Admitting: Family Medicine

## 2015-04-14 ENCOUNTER — Inpatient Hospital Stay: Payer: Self-pay | Admitting: Family Medicine

## 2015-05-31 ENCOUNTER — Encounter (HOSPITAL_COMMUNITY): Payer: Self-pay | Admitting: Emergency Medicine

## 2015-05-31 ENCOUNTER — Emergency Department (HOSPITAL_COMMUNITY): Payer: Medicaid Other

## 2015-05-31 ENCOUNTER — Emergency Department (HOSPITAL_COMMUNITY)
Admission: EM | Admit: 2015-05-31 | Discharge: 2015-06-01 | Disposition: A | Discharge status: Other Acute Inpatient Hospital | Payer: Medicaid Other | Attending: Emergency Medicine | Admitting: Emergency Medicine

## 2015-05-31 DIAGNOSIS — K219 Gastro-esophageal reflux disease without esophagitis: Secondary | ICD-10-CM | POA: Insufficient documentation

## 2015-05-31 DIAGNOSIS — F329 Major depressive disorder, single episode, unspecified: Secondary | ICD-10-CM | POA: Diagnosis not present

## 2015-05-31 DIAGNOSIS — Y9389 Activity, other specified: Secondary | ICD-10-CM | POA: Insufficient documentation

## 2015-05-31 DIAGNOSIS — S02609A Fracture of mandible, unspecified, initial encounter for closed fracture: Secondary | ICD-10-CM

## 2015-05-31 DIAGNOSIS — Z8673 Personal history of transient ischemic attack (TIA), and cerebral infarction without residual deficits: Secondary | ICD-10-CM | POA: Insufficient documentation

## 2015-05-31 DIAGNOSIS — S02611A Fracture of condylar process of right mandible, initial encounter for closed fracture: Secondary | ICD-10-CM | POA: Insufficient documentation

## 2015-05-31 DIAGNOSIS — I10 Essential (primary) hypertension: Secondary | ICD-10-CM | POA: Diagnosis not present

## 2015-05-31 DIAGNOSIS — Y998 Other external cause status: Secondary | ICD-10-CM | POA: Insufficient documentation

## 2015-05-31 DIAGNOSIS — Z79899 Other long term (current) drug therapy: Secondary | ICD-10-CM | POA: Insufficient documentation

## 2015-05-31 DIAGNOSIS — Y9289 Other specified places as the place of occurrence of the external cause: Secondary | ICD-10-CM | POA: Insufficient documentation

## 2015-05-31 DIAGNOSIS — Z720 Tobacco use: Secondary | ICD-10-CM | POA: Insufficient documentation

## 2015-05-31 DIAGNOSIS — W19XXXA Unspecified fall, initial encounter: Secondary | ICD-10-CM

## 2015-05-31 DIAGNOSIS — Z23 Encounter for immunization: Secondary | ICD-10-CM | POA: Diagnosis not present

## 2015-05-31 DIAGNOSIS — J449 Chronic obstructive pulmonary disease, unspecified: Secondary | ICD-10-CM | POA: Insufficient documentation

## 2015-05-31 DIAGNOSIS — Z880 Allergy status to penicillin: Secondary | ICD-10-CM | POA: Diagnosis not present

## 2015-05-31 DIAGNOSIS — F10120 Alcohol abuse with intoxication, uncomplicated: Secondary | ICD-10-CM | POA: Insufficient documentation

## 2015-05-31 DIAGNOSIS — F419 Anxiety disorder, unspecified: Secondary | ICD-10-CM | POA: Diagnosis not present

## 2015-05-31 DIAGNOSIS — W01198A Fall on same level from slipping, tripping and stumbling with subsequent striking against other object, initial encounter: Secondary | ICD-10-CM | POA: Insufficient documentation

## 2015-05-31 DIAGNOSIS — R0789 Other chest pain: Secondary | ICD-10-CM

## 2015-05-31 DIAGNOSIS — F10920 Alcohol use, unspecified with intoxication, uncomplicated: Secondary | ICD-10-CM

## 2015-05-31 DIAGNOSIS — S0993XA Unspecified injury of face, initial encounter: Secondary | ICD-10-CM | POA: Diagnosis present

## 2015-05-31 LAB — CBC WITH DIFFERENTIAL/PLATELET
BASOS PCT: 0 %
Basophils Absolute: 0 10*3/uL (ref 0.0–0.1)
Eosinophils Absolute: 0 10*3/uL (ref 0.0–0.7)
Eosinophils Relative: 0 %
HEMATOCRIT: 37 % — AB (ref 39.0–52.0)
Hemoglobin: 12.7 g/dL — ABNORMAL LOW (ref 13.0–17.0)
LYMPHS ABS: 1.6 10*3/uL (ref 0.7–4.0)
LYMPHS PCT: 20 %
MCH: 29.7 pg (ref 26.0–34.0)
MCHC: 34.3 g/dL (ref 30.0–36.0)
MCV: 86.4 fL (ref 78.0–100.0)
MONO ABS: 1.2 10*3/uL — AB (ref 0.1–1.0)
MONOS PCT: 16 %
NEUTROS ABS: 5 10*3/uL (ref 1.7–7.7)
Neutrophils Relative %: 64 %
Platelets: 227 10*3/uL (ref 150–400)
RBC: 4.28 MIL/uL (ref 4.22–5.81)
RDW: 15.4 % (ref 11.5–15.5)
WBC: 7.8 10*3/uL (ref 4.0–10.5)

## 2015-05-31 LAB — COMPREHENSIVE METABOLIC PANEL
ALK PHOS: 70 U/L (ref 38–126)
ALT: 53 U/L (ref 17–63)
ANION GAP: 12 (ref 5–15)
AST: 68 U/L — ABNORMAL HIGH (ref 15–41)
Albumin: 3.4 g/dL — ABNORMAL LOW (ref 3.5–5.0)
BILIRUBIN TOTAL: 0.5 mg/dL (ref 0.3–1.2)
BUN: 8 mg/dL (ref 6–20)
CALCIUM: 8.1 mg/dL — AB (ref 8.9–10.3)
CO2: 27 mmol/L (ref 22–32)
Chloride: 82 mmol/L — ABNORMAL LOW (ref 101–111)
Creatinine, Ser: 0.78 mg/dL (ref 0.61–1.24)
Glucose, Bld: 97 mg/dL (ref 65–99)
POTASSIUM: 2.9 mmol/L — AB (ref 3.5–5.1)
Sodium: 121 mmol/L — ABNORMAL LOW (ref 135–145)
TOTAL PROTEIN: 6.4 g/dL — AB (ref 6.5–8.1)

## 2015-05-31 LAB — RAPID URINE DRUG SCREEN, HOSP PERFORMED
Amphetamines: NOT DETECTED
Barbiturates: NOT DETECTED
Benzodiazepines: NOT DETECTED
Cocaine: NOT DETECTED
OPIATES: NOT DETECTED
Tetrahydrocannabinol: NOT DETECTED

## 2015-05-31 LAB — URINALYSIS W MICROSCOPIC (NOT AT ARMC)
Bilirubin Urine: NEGATIVE
Glucose, UA: NEGATIVE mg/dL
HGB URINE DIPSTICK: NEGATIVE
KETONES UR: NEGATIVE mg/dL
LEUKOCYTES UA: NEGATIVE
NITRITE: NEGATIVE
Protein, ur: NEGATIVE mg/dL
Specific Gravity, Urine: 1.005 (ref 1.005–1.030)
UROBILINOGEN UA: 0.2 mg/dL (ref 0.0–1.0)
pH: 6 (ref 5.0–8.0)

## 2015-05-31 LAB — I-STAT TROPONIN, ED: Troponin i, poc: 0.01 ng/mL (ref 0.00–0.08)

## 2015-05-31 LAB — MAGNESIUM: Magnesium: 1.9 mg/dL (ref 1.7–2.4)

## 2015-05-31 LAB — ETHANOL: Alcohol, Ethyl (B): 221 mg/dL — ABNORMAL HIGH (ref <5)

## 2015-05-31 LAB — SALICYLATE LEVEL

## 2015-05-31 MED ORDER — SODIUM CHLORIDE 0.9 % IV BOLUS (SEPSIS)
1000.0000 mL | Freq: Once | INTRAVENOUS | Status: AC
  Administered 2015-05-31: 1000 mL via INTRAVENOUS

## 2015-05-31 MED ORDER — ACETAMINOPHEN 325 MG PO TABS
325.0000 mg | ORAL_TABLET | Freq: Once | ORAL | Status: DC
  Filled 2015-05-31: qty 1

## 2015-05-31 MED ORDER — TETANUS-DIPHTH-ACELL PERTUSSIS 5-2.5-18.5 LF-MCG/0.5 IM SUSP
0.5000 mL | Freq: Once | INTRAMUSCULAR | Status: AC
  Administered 2015-05-31: 0.5 mL via INTRAMUSCULAR
  Filled 2015-05-31: qty 0.5

## 2015-05-31 MED ORDER — LIDOCAINE-EPINEPHRINE (PF) 2 %-1:200000 IJ SOLN
10.0000 mL | Freq: Once | INTRAMUSCULAR | Status: AC
  Administered 2015-05-31: 23:00:00 via INTRADERMAL
  Filled 2015-05-31: qty 20

## 2015-05-31 MED ORDER — KETOROLAC TROMETHAMINE 30 MG/ML IJ SOLN
30.0000 mg | Freq: Once | INTRAMUSCULAR | Status: AC
  Administered 2015-05-31: 30 mg via INTRAVENOUS
  Filled 2015-05-31: qty 1

## 2015-05-31 MED ORDER — POTASSIUM CHLORIDE 10 MEQ/100ML IV SOLN
10.0000 meq | INTRAVENOUS | Status: AC
  Administered 2015-05-31 – 2015-06-01 (×2): 10 meq via INTRAVENOUS
  Filled 2015-05-31 (×2): qty 100

## 2015-05-31 NOTE — ED Notes (Addendum)
According to [facility], they were called to [facility] after pt has an unwitnessed fall.  He did have a LOC and does not remember the fall.  According to EMS pt has a 1inch lac under his chin that "goes to the bone."  He also complains of left sided chest pain scoring a 9 that he describes as burning.  Hx of COPD, Stroke and MI (no dates).  He also reports that he has not had any of his medications for two weeks (including psy meds).  He endorses SI stating that "I just want to die....tired of living...have a gun at my mother's house that works."  EMS reports that he admits to ETOH, (5 40oz w/ the last one being 90 minutes ago)

## 2015-05-31 NOTE — ED Notes (Signed)
Pt unable to provide urine sample. Will attempt again later.

## 2015-05-31 NOTE — ED Notes (Signed)
Spoke to lab will add on Mag.

## 2015-05-31 NOTE — ED Provider Notes (Signed)
CSN: [id number]     Arrival date & time 05/31/15  2030 History   First MD Initiated Contact with Patient 05/31/15 2039     Chief Complaint  Patient presents with  . Fall     (Consider location/radiation/quality/duration/timing/severity/associated sxs/prior Treatment) HPI  Level V caveat due to altered mental status from acute intoxication. 59 year old male wit history of alcohol abuse, COPD, TIA, and hypertension who presents after fall. He was brought in by EMS from the [facility] after patient had an unwitnessed fall. According to EMS, they believe that he had a syncopal episode and sustained laceration to his chin. He began to complain of chest pain afterwards. I he also endorse suicidal ideation to EMS, stating that he had wanted to die and was tired of living. He admits to drinking a significant amount of alcohol earlier today, last drink was about 30 minutes prior to arrival he states. He is an unreliable historian as he initially states that he has been out of his medications for 2 weeks. However upon requestioning he says that he took extra doses of his own fluoxetine and trazodone prior to arrival. Denies suicide attempt. He also told EMS that he had blacked out prior to his fall, the patient states that he he does not remember anything that has happened to him today, including syncope or fall.   Past Medical History  Diagnosis Date  . Cirrhosis ([place])   . Degenerative joint disease   . Hypertension   . Gastric ulcer   . Depression   . Asthma   . Mitral valve prolapse 2002  . H/O hiatal hernia   . COPD (chronic obstructive pulmonary disease) ([place])   . TIA (transient ischemic attack)     2010  . Stroke [facility])     TIA - 2010 - no deficits   . Shortness of breath     with exertion   . GERD (gastroesophageal reflux disease)   . Headache(784.0)   . Hiatal hernia 1982  . Degenerative joint disease   . Anxiety   . Alcohol abuse    Past Surgical History  Procedure Laterality  Date  . Gastrectomy    . Shoulder surgery Bilateral     3 surgeries on on left, 2 surgeries on right   . Rt knee arthroscopic surgery    . Back surgery      3 cervical spine surgeries C4-C5 fused  . Hernia repair    . Finger surgery Left     2nd, 3rd, & 4th fingers were cut off by table saw and reattached  . Colonoscopy N/A 01/04/2014    Procedure: COLONOSCOPY;  Surgeon: Danie Binder, MD;  Location: [facility];  Service: Endoscopy;  Laterality: N/A;  1:45  . Esophagogastroduodenoscopy N/A 01/04/2014    Procedure: ESOPHAGOGASTRODUODENOSCOPY (EGD);  Surgeon: Danie Binder, MD;  Location: [facility];  Service: Endoscopy;  Laterality: N/A;  . Incisional hernia repair N/A 01/20/2014    Procedure: LAPAROSCOPIC RECURRENT  INCISIONAL HERNIA with mesh;  Surgeon: Edward Jolly, MD;  Location: [facility] ORS;  Service: General;  Laterality: N/A;   Family History  Problem Relation Age of Onset  . Cancer Father     bone  . Cancer Brother     lungs  . Stroke Maternal Grandmother   . Colon cancer Neg Hx   . Asthma Son     died at age 25 in his sleep   . Spina bifida Son  died at age 24    Social History  Substance Use Topics  . Smoking status: Current Every Day Smoker -- 1.00 packs/day for 45 years    Types: Cigarettes    Start date: 07/30/1966  . Smokeless tobacco: Never Used  . Alcohol Use: 3.6 oz/week    6 Cans of beer per week     Comment: 1 case of beer and 1 shot of vodka between 1030am-1430pm    Review of Systems 10/14 systems reviewed and are negative other than those stated in the HPI    Allergies  Bee venom and Penicillins  Home Medications   Prior to Admission medications   Medication Sig Start Date End Date Taking? Authorizing Provider  FLUoxetine (PROZAC) 10 MG capsule Take 1 capsule (10 mg total) by mouth daily. 03/18/15  Yes Kerrie Buffalo, NP  gabapentin (NEURONTIN) 300 MG capsule Take 1 capsule (300 mg total) by mouth 2 (two) times daily. 03/18/15  Yes Kerrie Buffalo, NP  metoprolol tartrate (LOPRESSOR) 25 MG tablet Take 1 tablet (25 mg total) by mouth daily before breakfast. 03/18/15  Yes Kerrie Buffalo, NP  omeprazole (PRILOSEC) 40 MG capsule Take 1 capsule (40 mg total) by mouth daily. 03/18/15  Yes Kerrie Buffalo, NP  traZODone (DESYREL) 100 MG tablet Take 1 tablet (100 mg total) by mouth once. 03/18/15  Yes Kerrie Buffalo, NP  clindamycin (CLEOCIN) 150 MG capsule Take 2 capsules (300 mg total) by mouth 3 (three) times daily. 06/01/15   Forde Dandy, MD  HYDROcodone-acetaminophen (HYCET) 7.5-325 mg/15 ml solution Take 15 mLs by mouth 4 (four) times daily as needed for moderate pain or severe pain. 06/01/15 05/31/16  Forde Dandy, MD  Multiple Vitamin (MULTIVITAMIN WITH MINERALS) TABS tablet Take 1 tablet by mouth daily. Patient not taking: Reported on 03/13/2015 01/22/15   Silver Huguenin Elgergawy, MD  nicotine (NICODERM CQ - DOSED IN MG/24 HOURS) 21 mg/24hr patch Place 1 patch (21 mg total) onto the skin daily. Patient not taking: Reported on 05/31/2015 03/18/15   Kerrie Buffalo, NP   BP 119/71 mmHg  Pulse 93  Temp(Src) 98.2 F (36.8 C) (Oral)  Resp 20  SpO2 95% Physical Exam Physical Exam  Nursing note and vitals reviewed. Constitutional: Disheveled, somnolent but arouses to voice, non-toxic and in no acute distress Head: Normocephalic. Tenderness to palpation along the mandible, with posterior displacement of the mandible.  Mouth/Throat: Oropharynx is clear and moist.  Neck: Normal range of motion. Neck supple.  Cardiovascular: Normal rate and regular rhythm.   no edema Pulmonary/Chest: Effort normal and breath sounds normal.  tenderness to palpation of the left chest wall. Abdominal: Soft. There is no tenderness. There is no rebound and no guarding.  Musculoskeletal: No deformities. Neurological:  Somnolent, arouses to voice, slurred speech, no facial droop, pupils equal and reactive to light, moves all 4 extremities to command Skin: 3 cm laceration  to the chin, no active bleeding  Psychiatric: Cooperative  ED Course  .[place] KitchenLaceration Repair Date/Time: 06/01/2015 1:30 AM Performed by: Brantley Stage DUO Authorized by: Brantley Stage DUO Consent: Verbal consent obtained. Risks and benefits: risks, benefits and alternatives were discussed Consent given by: patient Patient identity confirmed: verbally with patient Time out: Immediately prior to procedure a "time out" was called to verify the correct patient, procedure, equipment, support staff and site/side marked as required. Body area: head/neck Location details: chin Laceration length: 3 cm Foreign bodies: no foreign bodies Tendon involvement: none Nerve involvement: none Vascular damage: no Anesthesia: local  infiltration Local anesthetic: lidocaine 1% with epinephrine Anesthetic total: 5 ml Patient sedated: no Preparation: Patient was prepped and draped in the usual sterile fashion. Irrigation solution: saline Irrigation method: syringe Amount of cleaning: extensive Debridement: none Degree of undermining: none Skin closure: 5-0 nylon Number of sutures: 5 Technique: simple Approximation: close Approximation difficulty: simple Dressing: 4x4 sterile gauze Patient tolerance: Patient tolerated the procedure well with no immediate complications   (including critical care time) Labs Review Labs Reviewed  CBC WITH DIFFERENTIAL/PLATELET - Abnormal; Notable for the following:    Hemoglobin 12.7 (*)    HCT 37.0 (*)    Monocytes Absolute 1.2 (*)    All other components within normal limits  COMPREHENSIVE METABOLIC PANEL - Abnormal; Notable for the following:    Sodium 121 (*)    Potassium 2.9 (*)    Chloride 82 (*)    Calcium 8.1 (*)    Total Protein 6.4 (*)    Albumin 3.4 (*)    AST 68 (*)    All other components within normal limits  ETHANOL - Abnormal; Notable for the following:    Alcohol, Ethyl (B) 221 (*)    All other components within normal limits  SALICYLATE LEVEL   URINALYSIS W MICROSCOPIC  URINE RAPID DRUG SCREEN, HOSP PERFORMED  MAGNESIUM  I-STAT TROPOININ, ED  I-STAT TROPOININ, ED    Imaging Review Ct Head Wo Contrast  05/31/2015  CLINICAL DATA:  59 year old male with a history of fall. Loss of consciousness. EXAM: CT HEAD WITHOUT CONTRAST CT MAXILLOFACIAL WITHOUT CONTRAST CT CERVICAL SPINE WITHOUT CONTRAST TECHNIQUE: Multidetector CT imaging of the head, cervical spine, and maxillofacial structures were performed using the standard protocol without intravenous contrast. Multiplanar CT image reconstructions of the cervical spine and maxillofacial structures were also generated. COMPARISON:  03/05/2015, 01/19/2015, 01/18/2015 FINDINGS: CT HEAD FINDINGS Unremarkable appearance of the calvarium without acute fracture or aggressive lesion. Unremarkable appearance of the scalp soft tissues. Unremarkable appearance of the bilateral orbits. Mastoid air cells are clear. No significant paranasal sinus disease No acute intracranial hemorrhage, midline shift, or mass effect. Gray-white differentiation is maintained, without CT evidence of acute ischemia. Unremarkable configuration of the ventricles. CT MAXILLOFACIAL FINDINGS Mandible: Acute fracture of the right neck of the mandible, with displacement of the condylar fracture fragment. There is acute fracture through the left condyle, with minimal displacement of the fracture fragment. . The patient is edentulous. Soft tissue swelling overlying the midline of the mental aspect of the mandible, compatible with given history. No underlying fracture. Mid face: No acute bony abnormality identified. Paranasal sinuses: Patency of the frontal sinuses, ethmoid air cells, bilateral maxillary sinuses, sphenoid sinuses. Mastoid air cells are clear. No fluid within the middle ear on the left or the right. Symmetry maintained of the bilateral facial soft tissues. Orbits:  Unremarkable appearance the bilateral orbits. Unremarkable  appearance the bilateral globes. Unremarkable appearance of the visualized intracranial structures. Unremarkable appearance of the visualized craniocervical junction and upper cervical spine CT CERVICAL SPINE FINDINGS Craniocervical junction aligned. No acute fracture at the skullbase identified. Anatomic alignment of the cervical elements is relatively maintained. No subluxation. Vertebral body heights maintained. No fracture line identified. Facets maintain alignment. Degenerative disc disease throughout the cervical spine. Surgical changes of prior anterior cervical discectomy infusion of C5-C6 with marrow spanning the disc space. Uncovertebral joint disease present at all levels. No significant bony canal narrowing. Bilateral facet disease. No evidence of epidural hemorrhage. Unremarkable appearance of the lung apices. IMPRESSION: Head CT: No CT  evidence of acute intracranial abnormality. Maxillofacial CT: Acute fracture of the right mandibular condylar neck, with displacement of the fracture fragment/ condyle. There is also acute fracture on the left through the condyle, with minimally displaced fracture fragment. Soft tissue swelling overlying the mental aspect of the mandible in the midline, compatible with the given history. Cervical CT: No CT evidence of acute fracture or malalignment of the cervical spine. Multilevel degenerative disc disease, with surgical changes at C5-C6. Signed, Dulcy Fanny. Earleen Newport, DO [facility] Radiology Electronically Signed   By: Corrie Mckusick D.O.   On: 05/31/2015 22:06   Ct Cervical Spine Wo Contrast  05/31/2015  CLINICAL DATA:  59 year old male with a history of fall. Loss of consciousness. EXAM: CT HEAD WITHOUT CONTRAST CT MAXILLOFACIAL WITHOUT CONTRAST CT CERVICAL SPINE WITHOUT CONTRAST TECHNIQUE: Multidetector CT imaging of the head, cervical spine, and maxillofacial structures were performed using the standard protocol without  intravenous contrast. Multiplanar CT image reconstructions of the cervical spine and maxillofacial structures were also generated. COMPARISON:  03/05/2015, 01/19/2015, 01/18/2015 FINDINGS: CT HEAD FINDINGS Unremarkable appearance of the calvarium without acute fracture or aggressive lesion. Unremarkable appearance of the scalp soft tissues. Unremarkable appearance of the bilateral orbits. Mastoid air cells are clear. No significant paranasal sinus disease No acute intracranial hemorrhage, midline shift, or mass effect. Gray-white differentiation is maintained, without CT evidence of acute ischemia. Unremarkable configuration of the ventricles. CT MAXILLOFACIAL FINDINGS Mandible: Acute fracture of the right neck of the mandible, with displacement of the condylar fracture fragment. There is acute fracture through the left condyle, with minimal displacement of the fracture fragment. . The patient is edentulous. Soft tissue swelling overlying the midline of the mental aspect of the mandible, compatible with given history. No underlying fracture. Mid face: No acute bony abnormality identified. Paranasal sinuses: Patency of the frontal sinuses, ethmoid air cells, bilateral maxillary sinuses, sphenoid sinuses. Mastoid air cells are clear. No fluid within the middle ear on the left or the right. Symmetry maintained of the bilateral facial soft tissues. Orbits:  Unremarkable appearance the bilateral orbits. Unremarkable appearance the bilateral globes. Unremarkable appearance of the visualized intracranial structures. Unremarkable appearance of the visualized craniocervical junction and upper cervical spine CT CERVICAL SPINE FINDINGS Craniocervical junction aligned. No acute fracture at the skullbase identified. Anatomic alignment of the cervical elements is relatively maintained. No subluxation. Vertebral body heights maintained. No fracture line identified. Facets maintain alignment. Degenerative disc disease throughout  the cervical spine. Surgical changes of prior anterior cervical discectomy infusion of C5-C6 with marrow spanning the disc space. Uncovertebral joint disease present at all levels. No significant bony canal narrowing. Bilateral facet disease. No evidence of epidural hemorrhage. Unremarkable appearance of the lung apices. IMPRESSION: Head CT: No CT evidence of acute intracranial abnormality. Maxillofacial CT: Acute fracture of the right mandibular condylar neck, with displacement of the fracture fragment/ condyle. There is also acute fracture on the left through the condyle, with minimally displaced fracture fragment. Soft tissue swelling overlying the mental aspect of the mandible in the midline, compatible with the given history. Cervical CT: No CT evidence of acute fracture or malalignment of the cervical spine. Multilevel degenerative disc disease, with surgical changes at C5-C6. Signed, Dulcy Fanny. Earleen Newport, DO [facility] Radiology Electronically Signed   By: Corrie Mckusick D.O.   On: 05/31/2015 22:06   Dg Chest Port 1 View  05/31/2015  CLINICAL DATA:  Left-sided chest pain after falling this evening EXAM: PORTABLE  CHEST 1 VIEW COMPARISON:  01/04/2015 FINDINGS: A single AP portable view of the chest demonstrates no focal airspace consolidation or alveolar edema. The lungs are grossly clear. There is no large effusion or pneumothorax. Cardiac and mediastinal contours appear unremarkable. IMPRESSION: No active disease. Electronically Signed   By: Andreas Newport M.D.   On: 05/31/2015 22:16   Ct Maxillofacial Wo Cm  05/31/2015  CLINICAL DATA:  59 year old male with a history of fall. Loss of consciousness. EXAM: CT HEAD WITHOUT CONTRAST CT MAXILLOFACIAL WITHOUT CONTRAST CT CERVICAL SPINE WITHOUT CONTRAST TECHNIQUE: Multidetector CT imaging of the head, cervical spine, and maxillofacial structures were performed using the standard protocol without intravenous  contrast. Multiplanar CT image reconstructions of the cervical spine and maxillofacial structures were also generated. COMPARISON:  03/05/2015, 01/19/2015, 01/18/2015 FINDINGS: CT HEAD FINDINGS Unremarkable appearance of the calvarium without acute fracture or aggressive lesion. Unremarkable appearance of the scalp soft tissues. Unremarkable appearance of the bilateral orbits. Mastoid air cells are clear. No significant paranasal sinus disease No acute intracranial hemorrhage, midline shift, or mass effect. Gray-white differentiation is maintained, without CT evidence of acute ischemia. Unremarkable configuration of the ventricles. CT MAXILLOFACIAL FINDINGS Mandible: Acute fracture of the right neck of the mandible, with displacement of the condylar fracture fragment. There is acute fracture through the left condyle, with minimal displacement of the fracture fragment. . The patient is edentulous. Soft tissue swelling overlying the midline of the mental aspect of the mandible, compatible with given history. No underlying fracture. Mid face: No acute bony abnormality identified. Paranasal sinuses: Patency of the frontal sinuses, ethmoid air cells, bilateral maxillary sinuses, sphenoid sinuses. Mastoid air cells are clear. No fluid within the middle ear on the left or the right. Symmetry maintained of the bilateral facial soft tissues. Orbits:  Unremarkable appearance the bilateral orbits. Unremarkable appearance the bilateral globes. Unremarkable appearance of the visualized intracranial structures. Unremarkable appearance of the visualized craniocervical junction and upper cervical spine CT CERVICAL SPINE FINDINGS Craniocervical junction aligned. No acute fracture at the skullbase identified. Anatomic alignment of the cervical elements is relatively maintained. No subluxation. Vertebral body heights maintained. No fracture line identified. Facets maintain alignment. Degenerative disc disease throughout the cervical  spine. Surgical changes of prior anterior cervical discectomy infusion of C5-C6 with marrow spanning the disc space. Uncovertebral joint disease present at all levels. No significant bony canal narrowing. Bilateral facet disease. No evidence of epidural hemorrhage. Unremarkable appearance of the lung apices. IMPRESSION: Head CT: No CT evidence of acute intracranial abnormality. Maxillofacial CT: Acute fracture of the right mandibular condylar neck, with displacement of the fracture fragment/ condyle. There is also acute fracture on the left through the condyle, with minimally displaced fracture fragment. Soft tissue swelling overlying the mental aspect of the mandible in the midline, compatible with the given history. Cervical CT: No CT evidence of acute fracture or malalignment of the cervical spine. Multilevel degenerative disc disease, with surgical changes at C5-C6. Signed, Dulcy Fanny. Earleen Newport, DO [facility] Radiology Electronically Signed   By: Corrie Mckusick D.O.   On: 05/31/2015 22:06   I have personally reviewed and evaluated these images and lab results as part of my medical decision-making.   EKG Interpretation None      MDM   Final diagnoses:  Alcohol intoxication, uncomplicated (HCC)  Closed fracture of mandible, unspecified mandibular site, initial encounter [facility])  Fall, initial encounter    In short, this is a 59 year old male who presents with  alcohol intoxication and fall. Appears very intoxicated on arrival. No focal neurological deficits. Laceration over chin with mild deformity of the lower jaw. Reproducible chest pain on exam. CT head and cervical spine performed, visualized, and negative for acute traumatic injury. CT face showing bilateral mandible fractures. CXR without acute cardiopulmonary processes. Given reproducible chest pain, this is most consistent with MSK pain from his fall.   Hyponatremic and hypokalemic here. Normal  magnesium. This is reflection of his alcohol abuse and likely decreased PO intake, as he appears dry as well. Given IVF and potassium repletion in the ED. No other evidence of toxic ingestion, but has alcohol level in 200s. Pending sober re-evaluation. If tertiary evaluation without additional injuries and no concern for suicidal thoughts when sober, may be discharged with outpatient ENT follow-up. However if persistently suicidal, will require TTS consult.  Spoke with Dr. Buelah Manis from ENT, who states that this is typically repaired as an outpatient within the next few days. Recommended liquid diet and antibiotics. Requested that the patient call his office tomorrow to set up same day follow-up.    Forde Dandy, MD 06/01/15 1311

## 2015-06-01 ENCOUNTER — Encounter (HOSPITAL_COMMUNITY): Payer: Self-pay | Admitting: *Deleted

## 2015-06-01 ENCOUNTER — Inpatient Hospital Stay (HOSPITAL_COMMUNITY)
Admission: EM | Admit: 2015-06-01 | Discharge: 2015-06-06 | DRG: 885 | Disposition: A | Discharge status: Other Acute Inpatient Hospital | Payer: Medicaid Other | Source: Intra-hospital | Attending: Psychiatry | Admitting: Psychiatry

## 2015-06-01 DIAGNOSIS — F329 Major depressive disorder, single episode, unspecified: Secondary | ICD-10-CM | POA: Diagnosis present

## 2015-06-01 DIAGNOSIS — I10 Essential (primary) hypertension: Secondary | ICD-10-CM | POA: Diagnosis present

## 2015-06-01 DIAGNOSIS — F10230 Alcohol dependence with withdrawal, uncomplicated: Secondary | ICD-10-CM | POA: Diagnosis not present

## 2015-06-01 DIAGNOSIS — R45851 Suicidal ideations: Secondary | ICD-10-CM | POA: Diagnosis present

## 2015-06-01 DIAGNOSIS — F17210 Nicotine dependence, cigarettes, uncomplicated: Secondary | ICD-10-CM | POA: Diagnosis present

## 2015-06-01 DIAGNOSIS — J449 Chronic obstructive pulmonary disease, unspecified: Secondary | ICD-10-CM | POA: Diagnosis present

## 2015-06-01 DIAGNOSIS — F332 Major depressive disorder, recurrent severe without psychotic features: Principal | ICD-10-CM | POA: Diagnosis present

## 2015-06-01 DIAGNOSIS — F4310 Post-traumatic stress disorder, unspecified: Secondary | ICD-10-CM | POA: Diagnosis not present

## 2015-06-01 DIAGNOSIS — S02611A Fracture of condylar process of right mandible, initial encounter for closed fracture: Secondary | ICD-10-CM | POA: Diagnosis not present

## 2015-06-01 DIAGNOSIS — Z8673 Personal history of transient ischemic attack (TIA), and cerebral infarction without residual deficits: Secondary | ICD-10-CM | POA: Diagnosis not present

## 2015-06-01 LAB — COMPREHENSIVE METABOLIC PANEL
ALBUMIN: 3.4 g/dL — AB (ref 3.5–5.0)
ALT: 42 U/L (ref 17–63)
AST: 38 U/L (ref 15–41)
Alkaline Phosphatase: 75 U/L (ref 38–126)
Anion gap: 5 (ref 5–15)
BILIRUBIN TOTAL: 0.8 mg/dL (ref 0.3–1.2)
BUN: 15 mg/dL (ref 6–20)
CHLORIDE: 101 mmol/L (ref 101–111)
CO2: 25 mmol/L (ref 22–32)
CREATININE: 0.83 mg/dL (ref 0.61–1.24)
Calcium: 8.4 mg/dL — ABNORMAL LOW (ref 8.9–10.3)
GFR calc Af Amer: >60 mL/min (ref >60)
Glucose, Bld: 102 mg/dL — ABNORMAL HIGH (ref 65–99)
Potassium: 4 mmol/L (ref 3.5–5.1)
Sodium: 131 mmol/L — ABNORMAL LOW (ref 135–145)
Total Protein: 6.6 g/dL (ref 6.5–8.1)

## 2015-06-01 LAB — I-STAT TROPONIN, ED: TROPONIN I, POC: 0.01 ng/mL (ref 0.00–0.08)

## 2015-06-01 MED ORDER — ONDANSETRON HCL 4 MG PO TABS: 4.0000 mg | ORAL_TABLET | Freq: Three times a day (TID) | ORAL | Status: DC | PRN

## 2015-06-01 MED ORDER — ADULT MULTIVITAMIN W/MINERALS CH
1.0000 | ORAL_TABLET | Freq: Every day | ORAL | Status: DC
  Administered 2015-06-01 – 2015-06-05 (×5): 1 via ORAL
  Filled 2015-06-01 (×8): qty 1

## 2015-06-01 MED ORDER — POTASSIUM CHLORIDE CRYS ER 20 MEQ PO TBCR
40.0000 meq | EXTENDED_RELEASE_TABLET | Freq: Three times a day (TID) | ORAL | Status: DC
  Filled 2015-06-01 (×3): qty 2

## 2015-06-01 MED ORDER — LORAZEPAM 1 MG PO TABS
1.0000 mg | ORAL_TABLET | Freq: Two times a day (BID) | ORAL | Status: AC
  Administered 2015-06-03 (×2): 1 mg via ORAL
  Filled 2015-06-01 (×2): qty 1

## 2015-06-01 MED ORDER — POTASSIUM CHLORIDE CRYS ER 20 MEQ PO TBCR
40.0000 meq | EXTENDED_RELEASE_TABLET | Freq: Three times a day (TID) | ORAL | Status: DC
  Filled 2015-06-01 (×2): qty 2

## 2015-06-01 MED ORDER — LORAZEPAM 1 MG PO TABS: 0.0000 mg | ORAL_TABLET | Freq: Four times a day (QID) | ORAL | Status: DC

## 2015-06-01 MED ORDER — IBUPROFEN 600 MG PO TABS: 600.0000 mg | ORAL_TABLET | Freq: Three times a day (TID) | ORAL | Status: DC | PRN

## 2015-06-01 MED ORDER — LORAZEPAM 1 MG PO TABS
1.0000 mg | ORAL_TABLET | Freq: Four times a day (QID) | ORAL | Status: AC
  Administered 2015-06-01 (×2): 1 mg via ORAL
  Filled 2015-06-01 (×2): qty 1

## 2015-06-01 MED ORDER — HYDROCODONE-ACETAMINOPHEN 7.5-325 MG/15ML PO SOLN: 15.0000 mL | Freq: Four times a day (QID) | ORAL | Status: DC | PRN

## 2015-06-01 MED ORDER — CLINDAMYCIN HCL 150 MG PO CAPS: 300.0000 mg | ORAL_CAPSULE | Freq: Three times a day (TID) | ORAL | Status: DC

## 2015-06-01 MED ORDER — BOOST / RESOURCE BREEZE PO LIQD
1.0000 | Freq: Three times a day (TID) | ORAL | Status: DC
  Administered 2015-06-01 – 2015-06-04 (×8): 1 via ORAL
  Administered 2015-06-04: 20:00:00 via ORAL
  Administered 2015-06-05 (×2): 1 via ORAL
  Filled 2015-06-01 (×19): qty 1

## 2015-06-01 MED ORDER — TRAZODONE HCL 50 MG PO TABS
50.0000 mg | ORAL_TABLET | Freq: Every evening | ORAL | Status: DC | PRN
  Administered 2015-06-01 – 2015-06-05 (×5): 50 mg via ORAL
  Filled 2015-06-01 (×13): qty 1

## 2015-06-01 MED ORDER — NICOTINE 21 MG/24HR TD PT24
MEDICATED_PATCH | TRANSDERMAL | Status: AC
  Administered 2015-06-01: 21 mg via TRANSDERMAL
  Filled 2015-06-01: qty 1

## 2015-06-01 MED ORDER — IBUPROFEN 800 MG PO TABS
800.0000 mg | ORAL_TABLET | Freq: Three times a day (TID) | ORAL | Status: DC | PRN
  Administered 2015-06-01 – 2015-06-06 (×12): 800 mg via ORAL
  Filled 2015-06-01 (×12): qty 1

## 2015-06-01 MED ORDER — GABAPENTIN 300 MG PO CAPS
300.0000 mg | ORAL_CAPSULE | Freq: Three times a day (TID) | ORAL | Status: DC
  Administered 2015-06-01 – 2015-06-05 (×13): 300 mg via ORAL
  Filled 2015-06-01 (×19): qty 1

## 2015-06-01 MED ORDER — FLUOXETINE HCL 10 MG PO CAPS
10.0000 mg | ORAL_CAPSULE | Freq: Every day | ORAL | Status: DC
  Administered 2015-06-01 – 2015-06-03 (×3): 10 mg via ORAL
  Filled 2015-06-01 (×5): qty 1

## 2015-06-01 MED ORDER — LORAZEPAM 1 MG PO TABS: 0.0000 mg | ORAL_TABLET | Freq: Two times a day (BID) | ORAL | Status: DC

## 2015-06-01 MED ORDER — LORAZEPAM 1 MG PO TABS
1.0000 mg | ORAL_TABLET | Freq: Three times a day (TID) | ORAL | Status: AC
  Administered 2015-06-02 (×3): 1 mg via ORAL
  Filled 2015-06-01 (×3): qty 1

## 2015-06-01 MED ORDER — ALUM & MAG HYDROXIDE-SIMETH 200-200-20 MG/5ML PO SUSP: 30.0000 mL | ORAL | Status: DC | PRN

## 2015-06-01 MED ORDER — METOPROLOL TARTRATE 25 MG PO TABS
25.0000 mg | ORAL_TABLET | Freq: Every day | ORAL | Status: DC
  Administered 2015-06-02 – 2015-06-05 (×4): 25 mg via ORAL
  Filled 2015-06-01 (×6): qty 1

## 2015-06-01 MED ORDER — INFLUENZA VAC SPLIT QUAD 0.5 ML IM SUSY
0.5000 mL | PREFILLED_SYRINGE | INTRAMUSCULAR | Status: AC
  Administered 2015-06-02: 0.5 mL via INTRAMUSCULAR
  Filled 2015-06-01: qty 0.5

## 2015-06-01 MED ORDER — CLINDAMYCIN HCL 300 MG PO CAPS
300.0000 mg | ORAL_CAPSULE | Freq: Three times a day (TID) | ORAL | Status: DC
  Administered 2015-06-01 – 2015-06-05 (×13): 300 mg via ORAL
  Filled 2015-06-01 (×2): qty 1
  Filled 2015-06-01: qty 2
  Filled 2015-06-01 (×10): qty 1
  Filled 2015-06-01: qty 2
  Filled 2015-06-01 (×5): qty 1

## 2015-06-01 MED ORDER — NICOTINE 21 MG/24HR TD PT24
21.0000 mg | MEDICATED_PATCH | Freq: Every day | TRANSDERMAL | Status: DC
  Administered 2015-06-01: 21 mg via TRANSDERMAL
  Filled 2015-06-01 (×3): qty 1

## 2015-06-01 MED ORDER — IPRATROPIUM-ALBUTEROL 20-100 MCG/ACT IN AERS
1.0000 | INHALATION_SPRAY | Freq: Four times a day (QID) | RESPIRATORY_TRACT | Status: DC | PRN
  Administered 2015-06-01 – 2015-06-05 (×3): 1 via RESPIRATORY_TRACT
  Filled 2015-06-01: qty 4

## 2015-06-01 MED ORDER — PANTOPRAZOLE SODIUM 40 MG PO TBEC
80.0000 mg | DELAYED_RELEASE_TABLET | Freq: Every day | ORAL | Status: DC
  Administered 2015-06-01 – 2015-06-05 (×5): 80 mg via ORAL
  Filled 2015-06-01 (×7): qty 2

## 2015-06-01 MED ORDER — NICOTINE 21 MG/24HR TD PT24
21.0000 mg | MEDICATED_PATCH | Freq: Every day | TRANSDERMAL | Status: DC
  Administered 2015-06-02 – 2015-06-05 (×3): 21 mg via TRANSDERMAL
  Filled 2015-06-01 (×7): qty 1

## 2015-06-01 MED ORDER — POTASSIUM CHLORIDE CRYS ER 20 MEQ PO TBCR
40.0000 meq | EXTENDED_RELEASE_TABLET | Freq: Once | ORAL | Status: AC
  Administered 2015-06-01: 40 meq via ORAL
  Filled 2015-06-01: qty 2

## 2015-06-01 MED ORDER — GI COCKTAIL ~~LOC~~
30.0000 mL | Freq: Two times a day (BID) | ORAL | Status: DC | PRN
  Filled 2015-06-01 (×2): qty 30

## 2015-06-01 MED ORDER — LORAZEPAM 1 MG PO TABS
1.0000 mg | ORAL_TABLET | Freq: Every day | ORAL | Status: AC
  Administered 2015-06-04: 1 mg via ORAL
  Filled 2015-06-01: qty 1

## 2015-06-01 MED ORDER — NICOTINE 21 MG/24HR TD PT24: 21.0000 mg | MEDICATED_PATCH | Freq: Every day | TRANSDERMAL | Status: DC

## 2015-06-01 MED ORDER — SODIUM CHLORIDE 0.9 % IV SOLN
Freq: Once | INTRAVENOUS | Status: AC
  Administered 2015-06-01: 03:00:00 via INTRAVENOUS

## 2015-06-01 MED ORDER — LORAZEPAM 1 MG PO TABS
1.0000 mg | ORAL_TABLET | Freq: Three times a day (TID) | ORAL | Status: DC | PRN
  Administered 2015-06-02 – 2015-06-05 (×4): 1 mg via ORAL
  Filled 2015-06-01 (×4): qty 1

## 2015-06-01 MED ORDER — POTASSIUM CHLORIDE 20 MEQ PO PACK: 60.0000 meq | PACK | Freq: Once | ORAL | Status: DC

## 2015-06-01 MED ORDER — POTASSIUM CHLORIDE CRYS ER 20 MEQ PO TBCR
60.0000 meq | EXTENDED_RELEASE_TABLET | Freq: Once | ORAL | Status: AC
  Administered 2015-06-01: 60 meq via ORAL
  Filled 2015-06-01: qty 3
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
## 2015-06-01 NOTE — BHH Counselor (Signed)
Called [facility] charge nurse Marcella to order cart for tele-psych at 4:52 a.m. On 06/01/15 Kiffany Schelling K. Nash Shearer, LPC-A, [facility]  Counselor 06/01/2015 4:54 AM

## 2015-06-01 NOTE — Discharge Instructions (Signed)
Please be on a liquid diet until you follow-up  Regarding your broken jaw. Please take antibiotics as prescribed. Return without fail for worsening symptoms, including unable to eat/drink, confusion, worsening pain, or any other symptoms concerning to you.  Alcohol Intoxication Alcohol intoxication occurs when the amount of alcohol that a person has consumed impairs his or her ability to mentally and physically function. Alcohol directly impairs the normal chemical activity of the brain. Drinking large amounts of alcohol can lead to changes in mental function and behavior, and it can cause many physical effects that can be harmful.  Alcohol intoxication can range in severity from mild to very severe. Various factors can affect the level of intoxication that occurs, such as the person's age, gender, weight, frequency of alcohol consumption, and the presence of other medical conditions (such as diabetes, seizures, or heart conditions). Dangerous levels of alcohol intoxication may occur when people drink large amounts of alcohol in a short period (binge drinking). Alcohol can also be especially dangerous when combined with certain prescription medicines or "recreational" drugs. SIGNS AND SYMPTOMS Some common signs and symptoms of mild alcohol intoxication include:  Loss of coordination.  Changes in mood and behavior.  Impaired judgment.  Slurred speech. As alcohol intoxication progresses to more severe levels, other signs and symptoms will appear. These may include:  Vomiting.  Confusion and impaired memory.  Slowed breathing.  Seizures.  Loss of consciousness. DIAGNOSIS  Your health care provider will take a medical history and perform a physical exam. You will be asked about the amount and type of alcohol you have consumed. Blood tests will be done to measure the concentration of alcohol in your blood. In many places, your blood alcohol level must be lower than 80 mg/dL (0.08%) to legally  drive. However, many dangerous effects of alcohol can occur at much lower levels.  TREATMENT  People with alcohol intoxication often do not require treatment. Most of the effects of alcohol intoxication are temporary, and they go away as the alcohol naturally leaves the body. Your health care provider will monitor your condition until you are stable enough to go home. Fluids are sometimes given through an IV access tube to help prevent dehydration.  HOME CARE INSTRUCTIONS  Do not drive after drinking alcohol.  Stay hydrated. Drink enough water and fluids to keep your urine clear or pale yellow. Avoid caffeine.   Only take over-the-counter or prescription medicines as directed by your health care provider.  SEEK MEDICAL CARE IF:   You have persistent vomiting.   You do not feel better after a few days.  You have frequent alcohol intoxication. Your health care provider can help determine if you should see a substance use treatment counselor. SEEK IMMEDIATE MEDICAL CARE IF:   You become shaky or tremble when you try to stop drinking.   You shake uncontrollably (seizure).   You throw up (vomit) blood. This may be bright red or may look like black coffee grounds.   You have blood in your stool. This may be bright red or may appear as a black, tarry, bad smelling stool.   You become lightheaded or faint.  MAKE SURE YOU:   Understand these instructions.  Will watch your condition.  Will get help right away if you are not doing well or get worse.   This information is not intended to replace advice given to you by your health care provider. Make sure you discuss any questions you have with your health care provider.  Document Released: 04/25/2005 Document Revised: 03/18/2013 Document Reviewed: 12/19/2012 Elsevier Interactive Patient Education 2016 Elsevier Inc[employer]  Mandibular Fracture A mandibular fracture is a break in the jawbone. CAUSES  The most common cause of  mandibular fracture is a direct blow (trauma) to the jaw. This could happen from:  A car crash.  Physical violence.  A fall from a high place. SYMPTOMS   Pain.  Swelling.  Difficulty and pain when closing the mouth.  Feeling that the teeth are not aligned properly when closing the mouth (malocclusion).  Difficulty speaking.  Difficulty swallowing. DIAGNOSIS  Your caregiver will take your history and perform a physical exam. He or she may also order imaging tests, such as X-rays or a computed tomography (CT) scan, to confirm your diagnosis. TREATMENT  Surgery is often needed to put the jaw back in the right position. Wires are usually placed around the teeth to hold the jaw in place while it heals. Treatment may also include pain medicine, ice, and a soft or liquid diet. HOME CARE INSTRUCTIONS   Put ice on the injured area.  Put ice in a plastic bag.  Place a towel between your skin and the bag.  Leave the ice on for 15-20 minutes, 03-04 times a day for the first 2 days.  Only take over-the-counter or prescription medicines for pain, discomfort, or fever as directed by your caregiver.  Eat a well-balanced, high-protein soft or liquid diet as directed by your caregiver.  If your jaws are wired, follow your caregiver's instructions for wired jaw care.  Sleep on your back to avoid putting pressure on your jaw.  Avoid exercising to the point that you become short of breath. SEEK MEDICAL CARE IF:   You have a severe headache or numbness in your face.  You have severe jaw pain that is not relieved with medicine.  Your jaw wires become loose.  You have uncontrollable nausea or anxiety.  Your swelling or redness gets worse. SEEK IMMEDIATE MEDICAL CARE IF:  You have a fever.  You have difficulty breathing.  You feel like your airway is tightening.  You cannot swallow your saliva.  You make a high-pitched whistling sound when you breathe (wheezing). MAKE SURE  YOU:   Understand these instructions.  Will watch your condition.  Will get help right away if you are not doing well or get worse.   This information is not intended to replace advice given to you by your health care provider. Make sure you discuss any questions you have with your health care provider.   Document Released: 07/16/2005 Document Revised: 08/06/2014 Document Reviewed: 08/01/2011 Elsevier Interactive Patient Education [employer].

## 2015-06-01 NOTE — BHH Counselor (Signed)
Per Frederico Hamman, [facility] in-patient criteria and recommends 300 hall placement. Confirmed with Dietrich Pates, [facility] nurse, RN 300-1 is available and can transport after 8:00 this a.m. On 06/01/2015 Dreya Buhrman K. Sharman Cheek, [facility]  Counselor 06/01/2015 6:12 AM'
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
## 2015-06-01 NOTE — BH Assessment (Signed)
Tele Assessment Note   Samuel Reyes is an 59 y.o. male, Caucasian, single who [facility]: According to [facility], they were called to [facility] after pt has an unwitnessed fall.  He did have a LOC and does not remember the fall.  According to EMS pt has a 1inch lac under his chin that "goes to the bone."  He also complains of left sided chest pain scoring a 9 that he describes as burning.  Hx of COPD, Stroke and MI (no dates).  He also reports that he has not had any of his medications for two weeks (including psy meds).  He endorses SI stating that "I just want to die....tired of living...have a gun at my mother's house that works."  EMS reports that he admits to ETOH, (5 40oz w/ the last one being 90 minutes ago). Patient identifies depression and a recent fall as his primary concern.  Patient stated that he has not taken the meds. that  He is prescribed in at or around 2 weeks from this date, and patient listed his meds. As being: Prozac, Trazedone, Neurotin, and BP meds., all of unspecified amounts. Patient has reported depression and past trauma due to multiple deaths of siblings and family members/ Patient reports an average of 4 hours of sleep per day followed by reports of nightmares dealing with multiple deaths. Patient denies history of psychotic symptoms.  Patient denies any current suicidal plan or intent, but reports that he always has suicidal thoughts. Patient denies homicidal ideation or intent. Patient acknowledges an attempt of suicide via overdose 8 months ago , and another attempt at or around 8 years ago via slitting his wrists. Patient reports a history of substance abuse via alcohol starting at the age of 85 with unspecified amounts and frequency of drinking in inconsistent patterns with last consumption on 11/2/206 with x5 40 oz cans of beer. Patient denies current or past history of auditory or visual hallucinations.  Patient resides at [facility]. Patient identifies past  death anniversaries as his primary stressor. Patient reports history of multiple in-patient psychiatric hospitalizations since 2015 relating to depression and alcohol consumption, and unspecified in-patient in [place]. Patient acknowledges being seen at [facility] in [place] via Dr. Glennon Mac for depression and past trauma relating to deaths with last visit at or around October 2016.   Patient is dressed in scrubs alert and oriented x 4. Patient speech was within normal limits and motor behavior appeared normal. Patient thought process is coherent. Patient was cooperative throughout assessment and states that he is agreeable to inpatient psychiatric treatment.  Diagnosis: 296.33 [F33.2] Major Depressive Disorder, Recurrent Episode, Severe 303.90 [F10.20] Alcohol Use Disorder, Moderate  Past Medical History:  Past Medical History  Diagnosis Date  . Cirrhosis ([place])   . Degenerative joint disease   . Hypertension   . Gastric ulcer   . Depression   . Asthma   . Mitral valve prolapse 2002  . H/O hiatal hernia   . COPD (chronic obstructive pulmonary disease) ([place])   . TIA (transient ischemic attack)     2010  . Stroke [facility])     TIA - 2010 - no deficits   . Shortness of breath     with exertion   . GERD (gastroesophageal reflux disease)   . Headache(784.0)   . Hiatal hernia 1982  . Degenerative joint disease   . Anxiety   . Alcohol abuse     Past Surgical History  Procedure Laterality Date  .  Gastrectomy    . Shoulder surgery Bilateral     3 surgeries on on left, 2 surgeries on right   . Rt knee arthroscopic surgery    . Back surgery      3 cervical spine surgeries C4-C5 fused  . Hernia repair    . Finger surgery Left     2nd, 3rd, & 4th fingers were cut off by table saw and reattached  . Colonoscopy N/A 01/04/2014    Procedure: COLONOSCOPY;  Surgeon: Danie Binder, MD;  Location: [facility] ENDO [facility];  Service: Endoscopy;  Laterality: N/A;  1:45  . Esophagogastroduodenoscopy N/A 01/04/2014     Procedure: ESOPHAGOGASTRODUODENOSCOPY (EGD);  Surgeon: Danie Binder, MD;  Location: [facility] ENDO SUITE;  Service: Endoscopy;  Laterality: N/A;  . Incisional hernia repair N/A 01/20/2014    Procedure: LAPAROSCOPIC RECURRENT  INCISIONAL HERNIA with mesh;  Surgeon: Edward Jolly, MD;  Location: [facility] ORS;  Service: General;  Laterality: N/A;    Family History:  Family History  Problem Relation Age of Onset  . Cancer Father     bone  . Cancer Brother     lungs  . Stroke Maternal Grandmother   . Colon cancer Neg Hx   . Asthma Son     died at age 63 in his sleep   . Spina bifida Son     died at age 62     Social History:  reports that he has been smoking Cigarettes.  He started smoking about 48 years ago. He has a 45 pack-year smoking history. He has never used smokeless tobacco. He reports that he drinks about 3.6 oz of alcohol per week. He reports that he does not use illicit drugs.  Additional Social History:  Alcohol / Drug Use Pain Medications: SEE MAR Prescriptions: SEE MAR Over the Counter: SEE MAR History of alcohol / drug use?: Yes Longest period of sobriety (when/how long): 1 year of sobriety in 2011 Negative Consequences of Use: Personal relationships Withdrawal Symptoms: Change in blood pressure Substance #1 Name of Substance 1: Alcohol 1 - Age of First Use: 17 1 - Amount (size/oz): unspecified, but pt identified few cans of beer 1 - Frequency: occasionally ,on pay day per pt. 1 - Duration: off and on for years 1 - Last Use / Amount: 5 40 oz cans of beer on 06/01/15  CIWA: CIWA-Ar BP: 130/64 mmHg Pulse Rate: 96 COWS:    PATIENT STRENGTHS: (choose at least two) Average or above average intelligence Capable of independent living  Allergies:  Allergies  Allergen Reactions  . Bee Venom Anaphylaxis  . Penicillins Rash    Has patient had a PCN reaction causing immediate rash, facial/tongue/throat swelling, SOB or lightheadedness with hypotension: {Yes Has patient had  a PCN reaction causing severe rash involving mucus membranes or skin necrosis: NO Has patient had a PCN reaction that required hospitalization {Yes Has patient had a PCN reaction occurring within the last 10 years: NO If all of the above answers are "NO", then may proceed with Cephalosporin use.     Home Medications:  (Not in a hospital admission)  OB/GYN Status:  No LMP for male patient.  General Assessment Data Location of Assessment: [facility] ED TTS Assessment: In system Is this a Tele or Face-to-Face Assessment?: Tele Assessment Is this an Initial Assessment or a Re-assessment for this encounter?: Initial Assessment Marital status: Single Maiden name: NA Is patient pregnant?: No Pregnancy Status: No Living Arrangements: Other (Comment) [employer]) Can pt return  to current living arrangement?: Yes Admission Status: Voluntary Is patient capable of signing voluntary admission?: Yes Referral Source: Self/Family/Friend Insurance type: Medicaid  Medical Screening Exam ([place]) Medical Exam completed: Yes  Crisis Care Plan Living Arrangements: Other (Comment) [employer]) Name of Psychiatrist: unknown Haematologist and [facility]) Name of Therapist: Dr. Glennon Mac ([place])  Education Status Is patient currently in school?: No Current Grade: NA Highest grade of school patient has completed: 7 Name of school: unknown Contact person: Alona Bene  [phone])  Risk to self with the past 6 months Suicidal Ideation: Yes-Currently Present (all the time according to pt.) Has patient been a risk to self within the past 6 months prior to admission? : Yes (previous inpt. ) Suicidal Intent: No-Not Currently/Within Last 6 Months Has patient had any suicidal intent within the past 6 months prior to admission? : No Is patient at risk for suicide?: Yes (pt expressed sucidal ideation and past 8 months o.d. attempt) Suicidal Plan?: No-Not  Currently/Within Last 6 Months Has patient had any suicidal plan within the past 6 months prior to admission? : No Access to Means: Yes Specify Access to Suicidal Means: pills What has been your use of drugs/alcohol within the last 12 months?: Alcohol  Previous Attempts/Gestures: Yes How many times?: 1 (o.d. on pills in past 8 months) Other Self Harm Risks: cut wrist (years ago according to pt.) Triggers for Past Attempts: Anniversary (numerous death of siblings and parents) Intentional Self Injurious Behavior: None Family Suicide History: No Recent stressful life event(s): Trauma (Comment) (anniversaries unspc. dates numerous deaths) Persecutory voices/beliefs?: No Depression: Yes Depression Symptoms: Insomnia, Tearfulness, Fatigue, Loss of interest in usual pleasures Substance abuse history and/or treatment for substance abuse?: Yes Suicide prevention information given to non-admitted patients: Not applicable  Risk to Others within the past 6 months Homicidal Ideation: No-Not Currently/Within Last 6 Months Does patient have any lifetime risk of violence toward others beyond the six months prior to admission? : No Thoughts of Harm to Others: No-Not Currently Present/Within Last 6 Months Current Homicidal Intent: No-Not Currently/Within Last 6 Months Current Homicidal Plan: No-Not Currently/Within Last 6 Months Access to Homicidal Means: No Identified Victim: NA History of harm to others?: No Assessment of Violence: None Noted Violent Behavior Description: NA Does patient have access to weapons?: No Criminal Charges Pending?: No Does patient have a court date: No Is patient on probation?: Unknown  Psychosis Hallucinations: None noted Delusions: None noted  Mental Status Report Appearance/Hygiene: In scrubs Eye Contact: Fair Motor Activity: Unremarkable Speech: Logical/coherent, Loud Level of Consciousness: Alert Mood: Depressed, Despair, Helpless Affect: Sad Anxiety  Level: Moderate Thought Processes: Coherent, Relevant Judgement: Unimpaired Orientation: Person, Place, Time, Situation, Appropriate for developmental age Obsessive Compulsive Thoughts/Behaviors: None  Cognitive Functioning Concentration: Normal Memory: Recent Intact, Remote Intact IQ: Average Insight: Fair Impulse Control: Good Appetite: Poor Weight Loss: 12 Weight Gain: 0 Sleep: Decreased Total Hours of Sleep: 4 Vegetative Symptoms: None  ADLScreening [facility] Assessment Services) Patient's cognitive ability adequate to safely complete daily activities?: Yes Patient able to express need for assistance with ADLs?: Yes Independently performs ADLs?: Yes (appropriate for developmental age)  Prior Inpatient Therapy Prior Inpatient Therapy: Yes Prior Therapy Dates: 2015 numeorus since 2015 Prior Therapy Facilty/Provider(s): [facility], multiple in [place] Reason for Treatment: depression  Prior Outpatient Therapy Prior Outpatient Therapy: Yes Prior Therapy Dates: 2016 Prior Therapy Facilty/Provider(s): [facility] Reason for Treatment: Depression, alcohol abuse Does patient have an ACCT  team?: No Does patient have Intensive In-House Services?  : No Does patient have [facility] services? : Yes Does patient have P4CC services?: No  ADL Screening (condition at time of admission) Patient's cognitive ability adequate to safely complete daily activities?: Yes Is the patient deaf or have difficulty hearing?: No Does the patient have difficulty seeing, even when wearing glasses/contacts?: No Does the patient have difficulty concentrating, remembering, or making decisions?: No Patient able to express need for assistance with ADLs?: Yes Does the patient have difficulty dressing or bathing?: No Independently performs ADLs?: Yes (appropriate for developmental age) Does the patient have difficulty walking or climbing stairs?: Yes (COPD and degenerative joint  disease) Weakness of Legs: Right Weakness of Arms/Hands: None  Home Assistive Devices/Equipment Home Assistive Devices/Equipment: None    Abuse/Neglect Assessment (Assessment to be complete while patient is alone) Physical Abuse: Denies Verbal Abuse: Denies Sexual Abuse: Denies Exploitation of patient/patient's resources: Denies Self-Neglect: Denies Values / Beliefs Cultural Requests During Hospitalization: None Spiritual Requests During Hospitalization: None   Advance Directives (For Healthcare) Does patient have an advance directive?: No Would patient like information on creating an advanced directive?: No - patient declined information    Additional Information 1:1 In Past 12 Months?: Yes CIRT Risk: No Elopement Risk: No Does patient have medical clearance?: Yes     Disposition: Consulted with Frederico Hamman, PA, and patient does meet criteria for in patient psychiatric treatment , and 300 hall bed is recommended. Confirmed with [place], RN 300-1 bed is available under care of Dr. Sabra Heck. Disposition Initial Assessment Completed for this Encounter: Yes  Kristeen Mans 06/01/2015 5:32 AM

## 2015-06-01 NOTE — ED Notes (Signed)
PT IS TO BE ON LIQUID DIET PER ENT UNTIL FURTHER NOTICE D/T [facility]OK[facility]AW.  THIS RN PLACED PT'S D/C PAPERWORK AND RXS IN C20 BOX ON POD C

## 2015-06-01 NOTE — Progress Notes (Signed)
Patient ID: Samuel Reyes, male   DOB: [birth year], 59 y.o.   MRN: [id number]  Pt currently presents with a flat affect and anxious behavior. Pt reports multiple physical symptoms throughout the day today. Pt in dayroom, not interacting with peers post admission. MD notified of pts admission, orders provided. Pt's safety ensured with 15 minute and environmental checks. Pt currently denies SI/HI and A/V hallucinations. Pt verbally agrees to seek staff if SI/HI or A/VH occurs and to consult with staff before acting on these thoughts. Will continue POC.

## 2015-06-01 NOTE — ED Notes (Signed)
Consulted with Dr. Rex Kras regarding plan of care. Pt has been cleared by MD and oral surgery and plan was to discharge and follow up with ENT, however SI was not addressed by previous MD. Pt will psych hold and be cleared by TTS.

## 2015-06-01 NOTE — Progress Notes (Signed)
Pt attended the evening NA speaker meeting. Pt was engaged and attentive.

## 2015-06-01 NOTE — Tx Team (Addendum)
Initial Interdisciplinary Treatment Plan   PATIENT STRESSORS: Financial difficulties Health problems Loss of wife passed 55yrs ago and son passed 6 yrs ago Substance abuse   PATIENT STRENGTHS: Ability for insight Communication skills General fund of knowledge   PROBLEM LIST: Problem List/Patient Goals Date to be addressed Date deferred Reason deferred Estimated date of resolution  depression 06/01/15     anxiety 06/01/15     Substance abuse 06/01/15     "I want help with my depression and suicidal thoughts"                                     DISCHARGE CRITERIA:  Ability to meet basic life and health needs Adequate post-discharge living arrangements Improved stabilization in mood, thinking, and/or behavior Medical problems require only outpatient monitoring Motivation to continue treatment in a less acute level of care Need for constant or close observation no longer present Reduction of life-threatening or endangering symptoms to within safe limits Safe-care adequate arrangements made Verbal commitment to aftercare and medication compliance Withdrawal symptoms are absent or subacute and managed without 24-hour nursing intervention  PRELIMINARY DISCHARGE PLAN: Outpatient therapy  PATIENT/FAMIILY INVOLVEMENT: This treatment plan has been presented to and reviewed with the patient, Samuel Reyes, and/or family member,   The patient and family have been given the opportunity to ask questions and make suggestions.  [name] 06/01/2015, 10:55 AM

## 2015-06-01 NOTE — BHH Suicide Risk Assessment (Signed)
[place] INPATIENT:  Family/Significant Other Suicide Prevention Education  Suicide Prevention Education:  Education Completed; Orion Crook (pt's mother) [phone] has been identified by the patient as the family member/significant other with whom the patient will be residing, and identified as the person(s) who will aid the patient in the event of a mental health crisis (suicidal ideations/suicide attempt).  With written consent from the patient, the family member/significant other has been provided the following suicide prevention education, prior to the and/or following the discharge of the patient.  The suicide prevention education provided includes the following:  Suicide risk factors  Suicide prevention and interventions  National Suicide Hotline telephone number  [facility] assessment telephone number  [facility] Emergency Assistance [place] and/or Residential Mobile Crisis Unit telephone number  Request made of family/significant other to:  Remove weapons (e.g., guns, rifles, knives), all items previously/currently identified as safety concern.    Remove drugs/medications (over-the-counter, prescriptions, illicit drugs), all items previously/currently identified as a safety concern.  The family member/significant other verbalizes understanding of the suicide prevention education information provided.  The family member/significant other agrees to remove the items of safety concern listed above.  Smart, Sundeep Destin LCSWA 06/01/2015, 3:04 PM

## 2015-06-01 NOTE — Progress Notes (Signed)
Patient up in mileu interacting with peers.  Attended afternoon NA meeting. Currently denies any SI/HI/AVH.  Incision line to left chin intact with sutures.  Denies any pain or discomfort at this time.  Will continue to monitor for safety.

## 2015-06-01 NOTE — BHH Suicide Risk Assessment (Signed)
[facility] Admission Suicide Risk Assessment   Nursing information obtained from:  Patient Demographic factors:  Male, Low socioeconomic status, Living alone, Access to firearms Current Mental Status:  Suicidal ideation indicated by patient Loss Factors:  Loss of significant relationship, Decline in physical health, Financial problems / change in socioeconomic status Historical Factors:  Prior suicide attempts, Family history of suicide, Family history of mental illness or substance abuse Risk Reduction Factors:  NA Total Time spent with patient: 45 minutes Principal Problem: MDD (major depressive disorder), recurrent severe, without psychosis ([place]) Diagnosis:   Patient Active Problem List   Diagnosis Date Noted  . MDD (major depressive disorder), recurrent severe, without psychosis ([place]) [F33.2] 06/01/2015  . Hypochloremia [E87.8]   . Epigastric pain [R10.13]   . Hepatic steatosis [K76.0] 02/05/2015  . Abdominal pain [R10.9] 02/05/2015  . Pressure ulcer [L89.90] 02/05/2015  . Protein-calorie malnutrition, severe ([place]) [E43] 01/19/2015  . Seizures ([place]) [R56.9] 01/19/2015  . New onset seizure ([place]) [R56.9]   . Chest pain [R07.9] 01/05/2015  . Suicidal ideation [R45.851] 01/05/2015  . GERD without esophagitis [K21.9] 01/05/2015  . Persistent vomiting [R11.10] 08/31/2014  . HCAP (healthcare-associated pneumonia) [J18.9] 08/31/2014  . Hypokalemia [E87.6] 08/31/2014  . Hyponatremia [E87.1] 08/31/2014  . Major depressive disorder, recurrent, severe without psychotic features ([place]) [F33.2]   . Alcohol dependence with uncomplicated withdrawal ([place]) [F10.230]   . Suicidal ideations [R45.851] 07/21/2014  . Drug overdose [T50.901A] 07/21/2014  . Fever [R50.9]   . Overdose [T50.901A] 07/20/2014  . S/P alcohol detoxification [Z09] 04/15/2014  . Alcohol dependence ([place]) [F10.20] 03/09/2014  . Major depression, recurrent ([place]) [F33.9] 03/09/2014  . PTSD (post-traumatic stress disorder) [F43.10]  03/09/2014  . Recurrent ventral incisional hernia [K43.2] 01/20/2014  . Incisional hernia, without obstruction or gangrene [K43.2] 11/26/2013  . Colon polyps [K63.5] 11/26/2013  . DJD (degenerative joint disease) [M19.90] 10/28/2013  . Smoking [Z72.0] 10/28/2013  . Knee pain [M25.569] 06/15/2013  . Essential hypertension [I10] 02/17/2013  . FH: lung cancer [Z80.1] 02/17/2013  . COPD (chronic obstructive pulmonary disease) ([place]) [J44.9] 02/17/2013  . Smoker [Z72.0] 02/17/2013  . GERD (gastroesophageal reflux disease) [K21.9] 02/17/2013  . Hiatal hernia [K44.9] 02/17/2013  . Bereavement [Z63.4] 02/02/2013     Continued Clinical Symptoms:  Alcohol Use Disorder Identification Test Final Score (AUDIT): 28 The "Alcohol Use Disorders Identification Test", Guidelines for Use in Primary Care, Second Edition.  World Pharmacologist [facility]). Score between 0-7:  no or low risk or alcohol related problems. Score between 8-15:  moderate risk of alcohol related problems. Score between 16-19:  high risk of alcohol related problems. Score 20 or above:  warrants further diagnostic evaluation for alcohol dependence and treatment.   CLINICAL FACTORS:   Depression:   Comorbid alcohol abuse/dependence Alcohol/Substance Abuse/Dependencies  Psychiatric Specialty Exam: Physical Exam  ROS  Blood pressure 140/82, pulse 113, temperature 98.2 F (36.8 C), temperature source Oral, resp. rate 16, height 5\' 3"  (1.6 m), weight 64.411 kg (142 lb), SpO2 100 %.Body mass index is 25.16 kg/(m^2).    COGNITIVE FEATURES THAT CONTRIBUTE TO RISK:  Closed-mindedness, Polarized thinking and Thought constriction (tunnel vision)    SUICIDE RISK:   Moderate:  Frequent suicidal ideation with limited intensity, and duration, some specificity in terms of plans, no associated intent, good self-control, limited dysphoria/symptomatology, some risk factors present, and identifiable protective factors, including available and  accessible social support.  PLAN OF CARE: See admission H and P  Medical Decision Making:  Review of Psycho-Social Stressors (1), Review or  order clinical lab tests (1), Review of Medication Regimen & Side Effects (2) and Review of New Medication or Change in Dosage (2)  I certify that inpatient services furnished can reasonably be expected to improve the patient's condition.   Collier A 06/01/2015, 5:18 PM

## 2015-06-01 NOTE — Clinical Social Work Note (Signed)
CSW contacted Barnett Applebaum at [facility] and was told that pt's bed would be held for him until Friday. On Friday, pt was instructed to contact Barnett Applebaum to let her know if she needs to continue to hold the bed or if pt plans to go to treatment. Referrals sent to both [place]. Pt plans to continue follow-up at [facility].   Maxie Better, [employer] Clinical Social Worker 06/01/2015 3:24 PM
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
## 2015-06-02 MED ORDER — ALBUTEROL SULFATE HFA 108 (90 BASE) MCG/ACT IN AERS
2.0000 | INHALATION_SPRAY | Freq: Four times a day (QID) | RESPIRATORY_TRACT | Status: DC | PRN
  Administered 2015-06-02 – 2015-06-04 (×2): 2 via RESPIRATORY_TRACT
  Filled 2015-06-02: qty 6.7
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
## 2015-06-02 NOTE — Progress Notes (Signed)
Patients BP up to 167/66 sitting with HR of 122.  Standing BP 138/73 with HR of120.  Patriciaann Clan notified no new orders.

## 2015-06-02 NOTE — Progress Notes (Signed)
[facility] MD Progress Note  06/02/2015 2:15 PM Cane Dubray  MRN:  [id number] Subjective:  Mazen is trying to get his life back together. States he needs to go to a residential treatment program. He is scared of going back to the [employer]. The way he feels now he feels it is going to be hard to abstain. Admits to increase anxiety and worry. He does not know what needs to happen with his jaw.  Principal Problem: MDD (major depressive disorder), recurrent severe, without psychosis ([place]) Diagnosis:   Patient Active Problem List   Diagnosis Date Noted  . MDD (major depressive disorder), recurrent severe, without psychosis ([place]) [F33.2] 06/01/2015  . Hypochloremia [E87.8]   . Epigastric pain [R10.13]   . Hepatic steatosis [K76.0] 02/05/2015  . Abdominal pain [R10.9] 02/05/2015  . Pressure ulcer [L89.90] 02/05/2015  . Protein-calorie malnutrition, severe ([place]) [E43] 01/19/2015  . Seizures ([place]) [R56.9] 01/19/2015  . New onset seizure ([place]) [R56.9]   . Chest pain [R07.9] 01/05/2015  . Suicidal ideation [R45.851] 01/05/2015  . GERD without esophagitis [K21.9] 01/05/2015  . Persistent vomiting [R11.10] 08/31/2014  . HCAP (healthcare-associated pneumonia) [J18.9] 08/31/2014  . Hypokalemia [E87.6] 08/31/2014  . Hyponatremia [E87.1] 08/31/2014  . Major depressive disorder, recurrent, severe without psychotic features ([place]) [F33.2]   . Alcohol dependence with uncomplicated withdrawal ([place]) [F10.230]   . Suicidal ideations [R45.851] 07/21/2014  . Drug overdose [T50.901A] 07/21/2014  . Fever [R50.9]   . Overdose [T50.901A] 07/20/2014  . S/P alcohol detoxification [Z09] 04/15/2014  . Alcohol dependence ([place]) [F10.20] 03/09/2014  . Major depression, recurrent ([place]) [F33.9] 03/09/2014  . PTSD (post-traumatic stress disorder) [F43.10] 03/09/2014  . Recurrent ventral incisional hernia [K43.2] 01/20/2014  . Incisional hernia, without obstruction or gangrene [K43.2] 11/26/2013  . Colon polyps  [K63.5] 11/26/2013  . DJD (degenerative joint disease) [M19.90] 10/28/2013  . Smoking [Z72.0] 10/28/2013  . Knee pain [M25.569] 06/15/2013  . Essential hypertension [I10] 02/17/2013  . FH: lung cancer [Z80.1] 02/17/2013  . COPD (chronic obstructive pulmonary disease) ([place]) [J44.9] 02/17/2013  . Smoker [Z72.0] 02/17/2013  . GERD (gastroesophageal reflux disease) [K21.9] 02/17/2013  . Hiatal hernia [K44.9] 02/17/2013  . Bereavement [Z63.4] 02/02/2013   Total Time spent with patient: 30 minutes  Past Psychiatric History: see admission H and P  Past Medical History:  Past Medical History  Diagnosis Date  . Cirrhosis ([place])   . Degenerative joint disease   . Hypertension   . Gastric ulcer   . Depression   . Asthma   . Mitral valve prolapse 2002  . H/O hiatal hernia   . COPD (chronic obstructive pulmonary disease) ([place])   . TIA (transient ischemic attack)     2010  . Stroke [facility])     TIA - 2010 - no deficits   . Shortness of breath     with exertion   . GERD (gastroesophageal reflux disease)   . Headache(784.0)   . Hiatal hernia 1982  . Degenerative joint disease   . Anxiety   . Alcohol abuse     Past Surgical History  Procedure Laterality Date  . Gastrectomy    . Shoulder surgery Bilateral     3 surgeries on on left, 2 surgeries on right   . Rt knee arthroscopic surgery    . Back surgery      3 cervical spine surgeries C4-C5 fused  . Hernia repair    . Finger surgery Left     2nd, 3rd, & 4th fingers were cut off  by table saw and reattached  . Colonoscopy N/A 01/04/2014    Procedure: COLONOSCOPY;  Surgeon: Danie Binder, MD;  Location: [facility]O [facility];  Service: Endoscopy;  Laterality: N/A;  1:45  . Esophagogastroduodenoscopy N/A 01/04/2014    Procedure: ESOPHAGOGASTRODUODENOSCOPY (EGD);  Surgeon: Danie Binder, MD;  Location: [facility] ENDO SU[facility];  Service: Endoscopy;  Laterality: N/A;  . Incisional hernia repair N/A 01/20/2014    Procedure: LAPAROSCOPIC RECURRENT  INCISIONAL  HERNIA with mesh;  Surgeon: Edward Jolly, MD;  Location: [facility] ORS;  Service: General;  Laterality: N/A;   Family History:  Family History  Problem Relation Age of Onset  . Cancer Father     bone  . Cancer Brother     lungs  . Stroke Maternal Grandmother   . Colon cancer Neg Hx   . Asthma Son     died at age 51 in his sleep   . Spina bifida Son     died at age 18    Family Psychiatric  History: see admission H and P Social History:  History  Alcohol Use  . 3.6 oz/week  . 6 Cans of beer per week    Comment: 1 case of beer and 1 shot of vodka between 1030am-1430pm     History  Drug Use No    Comment: denid using any drugs    Social History   Social History  . Marital Status: Widowed    Spouse Name: N/A  . Number of Children: N/A  . Years of Education: N/A   Occupational History  . Disability    Social History Main Topics  . Smoking status: Current Every Day Smoker -- 1.00 packs/day for 45 years    Types: Cigarettes    Start date: 07/30/1966  . Smokeless tobacco: Never Used  . Alcohol Use: 3.6 oz/week    6 Cans of beer per week     Comment: 1 case of beer and 1 shot of vodka between 1030am-1430pm  . Drug Use: No     Comment: denid using any drugs  . Sexual Activity: Not Currently   Other Topics Concern  . None   Social History Narrative   Additional Social History:    History of alcohol / drug use?: Yes Negative Consequences of Use: Financial, Personal relationships                    Sleep: Fair  Appetite:  Fair  Current Medications: Current Facility-Administered Medications  Medication Dose Route Frequency Provider Last Rate Last Dose  . albuterol (PROVENTIL HFA;VENTOLIN HFA) 108 (90 BASE) MCG/ACT inhaler 2 puff  2 puff Inhalation Q6H PRN Nicholaus Bloom, MD      . alum & mag hydroxide-simeth (MAALOX/MYLANTA) 200-200-20 MG/5ML suspension 30 mL  30 mL Oral PRN Patrecia Pour, NP      . clindamycin (CLEOCIN) capsule 300 mg  300 mg Oral TID  Benjamine Mola, FNP   300 mg at 06/02/15 1118  . feeding supplement (BOOST / RESOURCE BREEZE) liquid 1 Container  1 Container Oral TID BM Nicholaus Bloom, MD   1 Container at 06/02/15 1118  . FLUoxetine (PROZAC) capsule 10 mg  10 mg Oral Daily Benjamine Mola, FNP   10 mg at 06/02/15 0759  . gabapentin (NEURONTIN) capsule 300 mg  300 mg Oral TID Benjamine Mola, FNP   300 mg at 06/02/15 1118  . gi cocktail (Maalox,Lidocaine,Donnatal)  30 mL Oral BID PRN Woodroe Chen  Sabra Heck, MD      . ibuprofen (ADVIL,MOTRIN) tablet 800 mg  800 mg Oral Q8H PRN Nicholaus Bloom, MD   800 mg at 06/02/15 0616  . Ipratropium-Albuterol (COMBIVENT) respimat 1 puff  1 puff Inhalation Q6H PRN Nicholaus Bloom, MD   1 puff at 06/01/15 2009  . LORazepam (ATIVAN) tablet 1 mg  1 mg Oral Q8H PRN Patrecia Pour, NP      . LORazepam (ATIVAN) tablet 1 mg  1 mg Oral TID Patrecia Pour, NP   1 mg at 06/02/15 1118   Followed by  . [START ON 06/03/2015] LORazepam (ATIVAN) tablet 1 mg  1 mg Oral BID Patrecia Pour, NP       Followed by  . [START ON 06/04/2015] LORazepam (ATIVAN) tablet 1 mg  1 mg Oral Daily Patrecia Pour, NP      . metoprolol tartrate (LOPRESSOR) tablet 25 mg  25 mg Oral QAC breakfast Benjamine Mola, FNP   25 mg at 06/02/15 7341  . multivitamin with minerals tablet 1 tablet  1 tablet Oral Daily Benjamine Mola, FNP   1 tablet at 06/02/15 0800  . nicotine (NICODERM CQ - dosed in mg/24 hours) patch 21 mg  21 mg Transdermal Daily Patrecia Pour, NP   21 mg at 06/02/15 0759  . ondansetron (ZOFRAN) tablet 4 mg  4 mg Oral Q8H PRN Patrecia Pour, NP      . pantoprazole (PROTONIX) EC tablet 80 mg  80 mg Oral Daily Benjamine Mola, FNP   80 mg at 06/02/15 0801  . traZODone (DESYREL) tablet 50 mg  50 mg Oral QHS,MR X 1 Benjamine Mola, FNP   50 mg at 06/01/15 2133    Lab Results:  Results for orders placed or performed during the hospital encounter of 06/01/15 (from the past 48 hour(s))  Comprehensive metabolic panel     Status: Abnormal    Collection Time: 06/01/15  6:23 PM  Result Value Ref Range   Sodium 131 (L) 135 - 145 mmol/L   Potassium 4.0 3.5 - 5.1 mmol/L   Chloride 101 101 - 111 mmol/L   CO2 25 22 - 32 mmol/L   Glucose, Bld 102 (H) 65 - 99 mg/dL   BUN 15 6 - 20 mg/dL   Creatinine, Ser 0.83 0.61 - 1.24 mg/dL   Calcium 8.4 (L) 8.9 - 10.3 mg/dL   Total Protein 6.6 6.5 - 8.1 g/dL   Albumin 3.4 (L) 3.5 - 5.0 g/dL   AST 38 15 - 41 U/L   ALT 42 17 - 63 U/L   Alkaline Phosphatase 75 38 - 126 U/L   Total Bilirubin 0.8 0.3 - 1.2 mg/dL   GFR calc non Af Amer >60 >60 mL/min   GFR calc Af Amer >60 >60 mL/min    Comment: (NOTE) The eGFR has been calculated using the CKD EPI equation. This calculation has not been validated in all clinical situations. eGFR's persistently <60 mL/min signify possible Chronic Kidney Disease.    Anion gap 5 5 - 15    Comment: Performed at [facility]    Physical Findings: AIMS: Facial and Oral Movements Muscles of Facial Expression: None, normal Lips and Perioral Area: None, normal Jaw: None, normal Tongue: None, normal,Extremity Movements Upper (arms, wrists, hands, fingers): None, normal Lower (legs, knees, ankles, toes): None, normal, Trunk Movements Neck, shoulders, hips: None, normal, Overall Severity Severity of abnormal movements (highest score from questions  above): None, normal Incapacitation due to abnormal movements: None, normal Patient's awareness of abnormal movements (rate only patient's report): No Awareness, Dental Status Current problems with teeth and/or dentures?: No (no teeth) Does patient usually wear dentures?: Yes (not with pt at hospital)  CIWA:  CIWA-Ar Total: 1 COWS:     Musculoskeletal: Strength & Muscle Tone: within normal limits Gait & Station: normal Patient leans: normal  Psychiatric Specialty Exam: Review of Systems  HENT:       Jaw pain  Eyes: Negative.   Respiratory: Negative.   Cardiovascular: Negative.    Gastrointestinal: Negative.   Genitourinary: Negative.   Musculoskeletal: Negative.   Skin: Negative.   Neurological: Positive for weakness and headaches.  Endo/Heme/Allergies: Negative.   Psychiatric/Behavioral: Positive for depression and substance abuse. The patient is nervous/anxious.     Blood pressure 138/73, pulse 129, temperature 97.9 F (36.6 C), temperature source Oral, resp. rate 18, height 5' 3"  (1.6 m), weight 64.411 kg (142 lb), SpO2 100 %.Body mass index is 25.16 kg/(m^2).  General Appearance: Disheveled  Eye Sport and exercise psychologist::  Fair  Speech:  Clear and Coherent  Volume:  Decreased  Mood:  Anxious and Depressed  Affect:  anxious worry  Thought Process:  Coherent and Goal Directed  Orientation:  Full (Time, Place, and Person)  Thought Content:  symptoms events worries concerns  Suicidal Thoughts:  No  Homicidal Thoughts:  No  Memory:  Immediate;   Fair Recent;   Fair Remote;   Fair  Judgement:  Fair  Insight:  Present and Shallow  Psychomotor Activity:  Restlessness  Concentration:  Fair  Recall:  [employer] of Knowledge:Fair  Language: Fair  Akathisia:  No  Handed:  Right  AIMS (if indicated):     Assets:  Desire for Improvement  ADL's:  Intact  Cognition: WNL  Sleep:  Number of Hours: 6.5   Treatment Plan Summary: Daily contact with patient to assess and evaluate symptoms and progress in treatment and Medication management Supportive approach/coping skills Alcohol dependence; continue the Ativan detox protocol/work a relapse prevention plan Depression; increase the Prozac to 20 mg daily Pain; work with the Neurontin 300 mg TID/Motrin 800 Fractured jaw; follow up on what to do next Work with CBT/mindfulness Explore residential treatment options Kodee Ravert A 06/02/2015, 2:15 PM

## 2015-06-02 NOTE — [facility] Group Notes (Signed)
[facility] LCSW Group Therapy 06/02/2015  1:15 pm   Type of Therapy: Group Therapy Participation Level: Active  Participation Quality: Attentive, Sharing and Supportive  Affect: Appropriate  Cognitive: Alert and Oriented  Insight: Developing/Improving and Engaged  Engagement in Therapy: Developing/Improving and Engaged  Modes of Intervention: Clarification, Confrontation, Discussion, Education, Exploration, Limit-setting, Orientation, Problem-solving, Rapport Building, Art therapist, Socialization and Support  Summary of Progress/Problems: The topic for group was balance in life. Today's group focused on defining balance in one's own words, identifying things that can knock one off balance, and exploring healthy ways to maintain balance in life. Group members were asked to provide an example of a time when they felt off balance, describe how they handled that situation,and process healthier ways to regain balance in the future. Group members were asked to share the most important tool for maintaining balance that they learned while at [facility] and how they plan to apply this method after discharge. Patient identified his lack of family relationships (death of son, estrangement from daughter and grandson) and alcohol use as throwing his life out of balance. CSW and other group members provided patient with emotional support and encouragement.   Tilden Fossa, MSW, [place] Worker [facility] [phone]

## 2015-06-02 NOTE — Progress Notes (Signed)
.   NUTRITION ASSESSMENT  Pt identified as at risk on the Malnutrition Screen Tool  INTERVENTION: 1. Educated patient on the importance of nutrition and encouraged intake of food and beverages. 2. Discussed weight goals. 3. Supplements: none at this time.  NUTRITION DIAGNOSIS: Unintentional weight loss related to sub-optimal intake as evidenced by pt report.   Goal: Pt to meet >/= 90% of their estimated nutrition needs.  Monitor:  PO intake  Assessment:  Pt seen for MST. Pt admitted for MDD. Per report, pt has been losing weight over the past few months due to social/financial situation. Per chart review, pt has lost 12 lbs (8% body weight) in the past 6 months which is not significant for time frame.  No supplements at this time; please order Ensure Enlive BID should supplements be warranted later in admission.  59 y.o. male  Height: Ht Readings from Last 1 Encounters:  06/01/15 5\' 3"  (1.6 m)    Weight: Wt Readings from Last 1 Encounters:  06/01/15 142 lb (64.411 kg)    Weight Hx: Wt Readings from Last 10 Encounters:  06/01/15 142 lb (64.411 kg)  03/14/15 141 lb 4 oz (64.071 kg)  02/05/15 132 lb 4.8 oz (60.011 kg)  01/18/15 140 lb 10.5 oz (63.8 kg)  01/05/15 144 lb 14.4 oz (65.726 kg)  12/10/14 154 lb 12.8 oz (70.217 kg)  09/22/14 158 lb 12.8 oz (72.031 kg)  08/31/14 160 lb (72.576 kg)  08/02/14 155 lb (70.308 kg)  07/23/14 156 lb 4.9 oz (70.9 kg)    BMI:  Body mass index is 25.16 kg/(m^2). Pt meets criteria for overweight based on current BMI.  Estimated Nutritional Needs: Kcal: 25-30 kcal/kg Protein: > 1 gram protein/kg Fluid: 1 ml/kcal  Diet Order: Diet full liquid Room service appropriate?: Yes; Fluid consistency:: Thin Pt is also offered choice of unit snacks mid-morning and mid-afternoon.  Pt is eating as desired.   Lab results and medications reviewed.      Jarome Matin, RD, LDN Inpatient Clinical Dietitian Pager # [phone] After  hours/weekend pager # [phone]

## 2015-06-02 NOTE — Progress Notes (Signed)
Patient ID: Marquavious Nazar, male   DOB: [birth year], 59 y.o.   MRN: [id number]  Pt currently presents with a flat affect and anxious behavior. Per self inventory, pt rates depression at a 7 and anxiety 5. Pt's daily goal is "getting better" and they intend to do so by "be more open." Pt reports fair sleep, a fair appetite, low energy and good concentration.   Pt provided with medications per providers orders. Pt's labs and vitals were monitored throughout the day. Pt supported emotionally and encouraged to express concerns and questions. Pt educated on medications. Administered flu vaccine, pt tolerated well.   Pt's safety ensured with 15 minute and environmental checks. Pt currently denies SI/HI and A/V hallucinations. Pt verbally agrees to seek staff if SI/HI or A/VH occurs and to consult with staff before acting on these thoughts. Pt attended groups today. Will continue POC.

## 2015-06-03 MED ORDER — HYDROXYZINE HCL 50 MG PO TABS
50.0000 mg | ORAL_TABLET | Freq: Four times a day (QID) | ORAL | Status: DC | PRN
  Administered 2015-06-03 – 2015-06-05 (×3): 50 mg via ORAL
  Filled 2015-06-03 (×3): qty 1

## 2015-06-03 MED ORDER — FLUOXETINE HCL 20 MG PO CAPS
20.0000 mg | ORAL_CAPSULE | Freq: Every day | ORAL | Status: DC
  Administered 2015-06-04 – 2015-06-05 (×2): 20 mg via ORAL
  Filled 2015-06-03 (×4): qty 1

## 2015-06-03 NOTE — Tx Team (Addendum)
Interdisciplinary Treatment Plan Update (Adult) Date: 06/03/2015   Time Reviewed: 9:30 AM  Progress in Treatment: Attending groups: Yes Participating in groups: Yes Taking medication as prescribed: Yes Tolerating medication: Yes Family/Significant other contact made: Yes, CSW has spoken with patient's mother Patient understands diagnosis: Yes Discussing patient identified problems/goals with staff: Yes Medical problems stabilized or resolved: Yes Denies suicidal/homicidal ideation: Yes Issues/concerns per patient self-inventory: Yes Other:  New problem(s) identified: N/A  Discharge Plan or Barriers: Patient will discharge to [facility] screening on Monday 11/7.    Reason for Continuation of Hospitalization:  Depression Anxiety Medication Stabilization   Comments: N/A  Estimated length of stay: 2-3 days   Samuel Reyes is a 59 years old Caucasian male admitted with Alcohol Abuse and depression. Pt currently denies SI/HI/AVH. Pt reports that he has been staying at the [facility] for a few nights after house he was living in was foreclosed on. Pt reports he relapsed on alcohol 3 weeks ago when he was unable to afford his psych meds. He reports several months of sobriety prior to recent relapse. No other drug use identified. He will benefit from crisis stabilization, detox, evaluation for medication, psycho-education groups for coping skills development, group therapy and case management for discharge planning. CSW made referrals to [facility] and [facility]. Pt plans to continue medication management at [facility].     Review of initial/current patient goals per problem list:  1. Goal(s): Patient will participate in aftercare plan   Met: Yes   Target date: 3-5 days post admission date   As evidenced by: Patient will participate within aftercare plan AEB aftercare provider and housing plan at discharge being identified.  11/4: Goal met: Patient plans to discharge to  [facility] on Monday 11/7 for admission screening.      2. Goal (s): Patient will exhibit decreased depressive symptoms and suicidal ideations.   Met: Adequate for discharge per MD.   Target date: 3-5 days post admission date   As evidenced by: Patient will utilize self rating of depression at 3 or below and demonstrate decreased signs of depression or be deemed stable for discharge by MD.  11/4: Adequate for discharge per MD. Patient rates depression at 7. Patient denies SI.       4. Goal(s): Patient will demonstrate decreased signs of withdrawal due to substance abuse   Met: Yes   Target date: 3-5 days post admission date   As evidenced by: Patient will produce a CIWA/COWS score of 0, have stable vitals signs, and no symptoms of withdrawal  11/4: Goal met: No withdrawal symptoms reported at this time per medical chart.     Attendees: Patient:    Family:    Physician: Dr. Parke Poisson; Dr. Sabra Heck 06/03/2015 9:30 AM  Nursing: Neldon Mc, RN 06/03/2015 9:30 AM  Clinical Social Worker: Tilden Fossa, LCSWA 06/03/2015 9:30 AM  Other: Peri Maris, LCSWA 06/03/2015 9:30 AM  Other: Norberto Sorenson, P4CC  06/03/2015 9:30 AM  Other: Lars Pinks, Case Manager 06/03/2015 9:30 AM  Other: 06/03/2015 9:30 AM  Other:    Other:    Other:    Other:    Other:     Scribe for Treatment Team:  Tilden Fossa, MSW, [employer] [phone]

## 2015-06-03 NOTE — Clinical Social Work Note (Signed)
Ms. B at [facility] has agreed to hold patient's bed until Monday 06/06/15.  Tilden Fossa, MSW, [place] Worker [facility] [phone]

## 2015-06-03 NOTE — Progress Notes (Signed)
[facility] MD Progress Note  06/03/2015 6:28 PM Samuel Reyes  MRN:  [id number] Subjective:  Samuel Reyes continues to endorse feeling anxious depressed. States there are some positive things happening like having the chance of going to [facility] on Monday but he is still feeling down depressed anxious. States there is an agency that is going to work with him once he is out Principal Problem: MDD (major depressive disorder), recurrent severe, without psychosis ([place]) Diagnosis:   Patient Active Problem List   Diagnosis Date Noted  . MDD (major depressive disorder), recurrent severe, without psychosis ([place]) [F33.2] 06/01/2015  . Hypochloremia [E87.8]   . Epigastric pain [R10.13]   . Hepatic steatosis [K76.0] 02/05/2015  . Abdominal pain [R10.9] 02/05/2015  . Pressure ulcer [L89.90] 02/05/2015  . Protein-calorie malnutrition, severe ([place]) [E43] 01/19/2015  . Seizures ([place]) [R56.9] 01/19/2015  . New onset seizure ([place]) [R56.9]   . Chest pain [R07.9] 01/05/2015  . Suicidal ideation [R45.851] 01/05/2015  . GERD without esophagitis [K21.9] 01/05/2015  . Persistent vomiting [R11.10] 08/31/2014  . HCAP (healthcare-associated pneumonia) [J18.9] 08/31/2014  . Hypokalemia [E87.6] 08/31/2014  . Hyponatremia [E87.1] 08/31/2014  . Major depressive disorder, recurrent, severe without psychotic features ([place]) [F33.2]   . Alcohol dependence with uncomplicated withdrawal ([place]) [F10.230]   . Suicidal ideations [R45.851] 07/21/2014  . Drug overdose [T50.901A] 07/21/2014  . Fever [R50.9]   . Overdose [T50.901A] 07/20/2014  . S/P alcohol detoxification [Z09] 04/15/2014  . Alcohol dependence ([place]) [F10.20] 03/09/2014  . Major depression, recurrent ([place]) [F33.9] 03/09/2014  . PTSD (post-traumatic stress disorder) [F43.10] 03/09/2014  . Recurrent ventral incisional hernia [K43.2] 01/20/2014  . Incisional hernia, without obstruction or gangrene [K43.2] 11/26/2013  . Colon polyps [K63.5] 11/26/2013  . DJD (degenerative  joint disease) [M19.90] 10/28/2013  . Smoking [Z72.0] 10/28/2013  . Knee pain [M25.569] 06/15/2013  . Essential hypertension [I10] 02/17/2013  . FH: lung cancer [Z80.1] 02/17/2013  . COPD (chronic obstructive pulmonary disease) ([place]) [J44.9] 02/17/2013  . Smoker [Z72.0] 02/17/2013  . GERD (gastroesophageal reflux disease) [K21.9] 02/17/2013  . Hiatal hernia [K44.9] 02/17/2013  . Bereavement [Z63.4] 02/02/2013   Total Time spent with patient: 30 minutes  Past Psychiatric History: see admission H and P  Past Medical History:  Past Medical History  Diagnosis Date  . Cirrhosis ([place])   . Degenerative joint disease   . Hypertension   . Gastric ulcer   . Depression   . Asthma   . Mitral valve prolapse 2002  . H/O hiatal hernia   . COPD (chronic obstructive pulmonary disease) ([place])   . TIA (transient ischemic attack)     2010  . Stroke [facility])     TIA - 2010 - no deficits   . Shortness of breath     with exertion   . GERD (gastroesophageal reflux disease)   . Headache(784.0)   . Hiatal hernia 1982  . Degenerative joint disease   . Anxiety   . Alcohol abuse     Past Surgical History  Procedure Laterality Date  . Gastrectomy    . Shoulder surgery Bilateral     3 surgeries on on left, 2 surgeries on right   . Rt knee arthroscopic surgery    . Back surgery      3 cervical spine surgeries C4-C5 fused  . Hernia repair    . Finger surgery Left     2nd, 3rd, & 4th fingers were cut off by table saw and reattached  . Colonoscopy N/A 01/04/2014    Procedure:  COLONOSCOPY;  Surgeon: Danie Binder, MD;  Location: [facility] ENDO SUITE;  Service: Endoscopy;  Laterality: N/A;  1:45  . Esophagogastroduodenoscopy N/A 01/04/2014    Procedure: ESOPHAGOGASTRODUODENOSCOPY (EGD);  Surgeon: Danie Binder, MD;  Location: [facility] ENDO SUITE;  Service: Endoscopy;  Laterality: N/A;  . Incisional hernia repair N/A 01/20/2014    Procedure: LAPAROSCOPIC RECURRENT  INCISIONAL HERNIA with mesh;  Surgeon: Edward Jolly, MD;  Location: [facility] ORS;  Service: General;  Laterality: N/A;   Family History:  Family History  Problem Relation Age of Onset  . Cancer Father     bone  . Cancer Brother     lungs  . Stroke Maternal Grandmother   . Colon cancer Neg Hx   . Asthma Son     died at age 25 in his sleep   . Spina bifida Son     died at age 57    Family Psychiatric  History: see admission H and P Social History:  History  Alcohol Use  . 3.6 oz/week  . 6 Cans of beer per week    Comment: 1 case of beer and 1 shot of vodka between 1030am-1430pm     History  Drug Use No    Comment: denid using any drugs    Social History   Social History  . Marital Status: Widowed    Spouse Name: N/A  . Number of Children: N/A  . Years of Education: N/A   Occupational History  . Disability    Social History Main Topics  . Smoking status: Current Every Day Smoker -- 1.00 packs/day for 45 years    Types: Cigarettes    Start date: 07/30/1966  . Smokeless tobacco: Never Used  . Alcohol Use: 3.6 oz/week    6 Cans of beer per week     Comment: 1 case of beer and 1 shot of vodka between 1030am-1430pm  . Drug Use: No     Comment: denid using any drugs  . Sexual Activity: Not Currently   Other Topics Concern  . None   Social History Narrative   Additional Social History:    History of alcohol / drug use?: Yes Negative Consequences of Use: Financial, Personal relationships                    Sleep: Fair  Appetite:  Fair  Current Medications: Current Facility-Administered Medications  Medication Dose Route Frequency Provider Last Rate Last Dose  . albuterol (PROVENTIL HFA;VENTOLIN HFA) 108 (90 BASE) MCG/ACT inhaler 2 puff  2 puff Inhalation Q6H PRN Nicholaus Bloom, MD   2 puff at 06/02/15 1706  . alum & mag hydroxide-simeth (MAALOX/MYLANTA) 200-200-20 MG/5ML suspension 30 mL  30 mL Oral PRN Patrecia Pour, NP      . clindamycin (CLEOCIN) capsule 300 mg  300 mg Oral TID Benjamine Mola,  FNP   300 mg at 06/03/15 1823  . feeding supplement (BOOST / RESOURCE BREEZE) liquid 1 Container  1 Container Oral TID BM Nicholaus Bloom, MD   1 Container at 06/03/15 1054  . FLUoxetine (PROZAC) capsule 10 mg  10 mg Oral Daily Benjamine Mola, FNP   10 mg at 06/03/15 0803  . gabapentin (NEURONTIN) capsule 300 mg  300 mg Oral TID Benjamine Mola, FNP   300 mg at 06/03/15 1823  . gi cocktail (Maalox,Lidocaine,Donnatal)  30 mL Oral BID PRN Nicholaus Bloom, MD      . hydrOXYzine (ATARAX/VISTARIL) tablet  50 mg  50 mg Oral Q6H PRN Nicholaus Bloom, MD      . ibuprofen (ADVIL,MOTRIN) tablet 800 mg  800 mg Oral Q8H PRN Nicholaus Bloom, MD   800 mg at 06/03/15 1106  . Ipratropium-Albuterol (COMBIVENT) respimat 1 puff  1 puff Inhalation Q6H PRN Nicholaus Bloom, MD   1 puff at 06/03/15 0804  . LORazepam (ATIVAN) tablet 1 mg  1 mg Oral Q8H PRN Patrecia Pour, NP   1 mg at 06/03/15 1343  . [START ON 06/04/2015] LORazepam (ATIVAN) tablet 1 mg  1 mg Oral Daily Patrecia Pour, NP      . metoprolol tartrate (LOPRESSOR) tablet 25 mg  25 mg Oral QAC breakfast Benjamine Mola, FNP   25 mg at 06/03/15 0645  . multivitamin with minerals tablet 1 tablet  1 tablet Oral Daily Benjamine Mola, FNP   1 tablet at 06/03/15 1478  . nicotine (NICODERM CQ - dosed in mg/24 hours) patch 21 mg  21 mg Transdermal Daily Patrecia Pour, NP   21 mg at 06/03/15 0803  . ondansetron (ZOFRAN) tablet 4 mg  4 mg Oral Q8H PRN Patrecia Pour, NP      . pantoprazole (PROTONIX) EC tablet 80 mg  80 mg Oral Daily Benjamine Mola, FNP   80 mg at 06/03/15 0804  . traZODone (DESYREL) tablet 50 mg  50 mg Oral QHS,MR X 1 Benjamine Mola, FNP   50 mg at 06/02/15 2159    Lab Results: No results found for this or any previous visit (from the past 48 hour(s)).  Physical Findings: AIMS: Facial and Oral Movements Muscles of Facial Expression: None, normal Lips and Perioral Area: None, normal Jaw: None, normal Tongue: None, normal,Extremity Movements Upper (arms,  wrists, hands, fingers): None, normal Lower (legs, knees, ankles, toes): None, normal, Trunk Movements Neck, shoulders, hips: None, normal, Overall Severity Severity of abnormal movements (highest score from questions above): None, normal Incapacitation due to abnormal movements: None, normal Patient's awareness of abnormal movements (rate only patient's report): No Awareness, Dental Status Current problems with teeth and/or dentures?: No (no teeth) Does patient usually wear dentures?: Yes (not with pt at hospital)  CIWA:  CIWA-Ar Total: 0 COWS:     Musculoskeletal: Strength & Muscle Tone: within normal limits Gait & Station: normal Patient leans: normal  Psychiatric Specialty Exam: Review of Systems  Constitutional: Negative.   HENT:       Jaw fracture  Eyes: Negative.   Respiratory: Negative.   Cardiovascular: Negative.   Gastrointestinal: Negative.   Genitourinary: Negative.   Musculoskeletal: Positive for joint pain and neck pain.  Skin: Negative.   Neurological: Negative.   Endo/Heme/Allergies: Negative.   Psychiatric/Behavioral: Positive for depression and substance abuse. The patient is nervous/anxious.     Blood pressure 139/72, pulse 108, temperature 97.9 F (36.6 C), temperature source Oral, resp. rate 16, height 5\' 3"  (1.6 m), weight 64.411 kg (142 lb), SpO2 100 %.Body mass index is 25.16 kg/(m^2).  General Appearance: Fairly Groomed  Engineer, water::  Fair  Speech:  Clear and Coherent  Volume:  Normal  Mood:  Anxious and Depressed  Affect:  anxious worried  Thought Process:  Coherent and Goal Directed  Orientation:  Full (Time, Place, and Person)  Thought Content:  symptoms events worries concerns  Suicidal Thoughts:  No  Homicidal Thoughts:  No  Memory:  Immediate;   Fair Recent;   Fair Remote;   Fair  Judgement:  Fair  Insight:  Present and Shallow  Psychomotor Activity:  Restlessness  Concentration:  Fair  Recall:  [employer] of Knowledge:Fair   Language: Fair  Akathisia:  No  Handed:  Right  AIMS (if indicated):     Assets:  Desire for Improvement  ADL's:  Intact  Cognition: WNL  Sleep:  Number of Hours: 5   Treatment Plan Summary: Daily contact with patient to assess and evaluate symptoms and progress in treatment and Medication management Supportive approach/coping skills Alcohol dependence; continue the Ativan detox protocol/work a relapse prevention plan Depression; will increase the Prozac to 20 mg daily Anxiety; will work with the Neurontin/Vistaril Will work with CBT/mindfulness Will facilitate being admitted to [facility] Monday AM [place] A 06/03/2015, 6:28 PM

## 2015-06-03 NOTE — Progress Notes (Signed)
  [facility] Discharge Plan :  Will you be returning to the same living situation after discharge:  No. Patient plans to go to [place] for screening At discharge, do you have transportation home?: Yes,  patient reports that his stepfather will provide transport Do you have the ability to pay for your medications: Yes,  patient will be provided with prescriptions at discharge  Release of information consent forms completed and in the chart;  Patient's signature needed at discharge.  Patient to Follow up at: Follow-up Information    Follow up with [facility].   Why:  Walk in between 8am-9am Monday through Friday for hospital follow-up/medication management.    Contact information:   [address], [place] [address] Phone: [phone] Fax: [phone]      Follow up with [facility] On 06/06/2015.   Why:  Admission screening scheduled for Monday November 7th at 8am. Please bring 2 week supply of meds, [place] ID, and clothing. Call office if you need to reschedule.   Contact information:   [address] [place] [phone]      Next level of care provider has access to [facility]  Patient denies SI/HI: Yes,  denies    Safety Planning and Suicide Prevention discussed: Yes,  with patient and mother  Have you used any form of tobacco in the last 30 days? (Cigarettes, Smokeless Tobacco, Cigars, and/or Pipes): Yes  Has patient been referred to the Quitline?: Patient refused referral  Xiomara Sevillano, Casimiro Needle 06/03/2015, 3:11 PM

## 2015-06-03 NOTE — Progress Notes (Signed)
D.  Pt pleasant but anxious on approach, denies complaints other than continued jaw pain and some anxiety.  Positive for evening AA group, interacting appropriately with peers on the unit.  Denies SI/HI/hallucinations at this time.  A.  Support and encouragement offered  R. Pt remains safe on the unit, will continue to monitor.

## 2015-06-03 NOTE — BHH Group Notes (Signed)
   [facility] LCSW Aftercare Discharge Planning Group Note  06/03/2015  8:45 AM   Participation Quality: Alert, Appropriate and Oriented  Mood/Affect: Appropriate  Depression Rating: 7  Anxiety Rating: 5  Thoughts of Suicide: Pt denies SI/HI  Will you contract for safety? Yes  Current AVH: Pt denies  Plan for Discharge/Comments: Pt attended discharge planning group and actively participated in group. CSW provided pt with today's workbook. Patient reports experiencing pain in his jaw and neck today. He plans to discharge to [facility] Monday morning for continued care.   Transportation Means: Pt reports access to transportation  Supports: No supports mentioned at this time  Tilden Fossa, MSW, [place] Social Worker [employer] [phone]

## 2015-06-03 NOTE — BHH Group Notes (Signed)
[facility] LCSW Group Therapy 06/03/2015 1:15 PM Type of Therapy: Group Therapy Participation Level: Active  Participation Quality: Attentive, Sharing and Supportive  Affect: Appropriate  Cognitive: Alert and Oriented  Insight: Developing/Improving and Engaged  Engagement in Therapy: Developing/Improving and Engaged  Modes of Intervention: Clarification, Confrontation, Discussion, Education, Exploration, Limit-setting, Orientation, Problem-solving, Rapport Building, Art therapist, Socialization and Support  Summary of Progress/Problems: The topic for today was feelings about relapse. Pt discussed what relapse prevention is to them and identified triggers that they are on the path to relapse. Pt processed their feeling towards relapse and was able to relate to peers. Pt discussed coping skills that can be used for relapse prevention. Patient discussed how his unresolved grief affects his drinking. CSW and other group members provided patient with emotional support and encouragement.   Tilden Fossa, MSW, [place] Worker [facility] [phone]

## 2015-06-03 NOTE — Progress Notes (Signed)
Patient has been noted a sad and depressed this shift.  Patient denies AVH and SI/HI.  Patient reported having a low mood and feeling anxious.  Patient has had no issues of behavior dyscontrol this shift.   Assess patient for safety, offer medications as prescribed, encourage patient to attend groups and participate in therapy.   Patient able to contract with safety.

## 2015-06-04 DIAGNOSIS — F332 Major depressive disorder, recurrent severe without psychotic features: Principal | ICD-10-CM

## 2015-06-04 MED ORDER — CLINDAMYCIN HCL 300 MG PO CAPS: 300.0000 mg | ORAL_CAPSULE | Freq: Three times a day (TID) | ORAL | Status: DC

## 2015-06-04 MED ORDER — PANTOPRAZOLE SODIUM 40 MG PO TBEC: 80.0000 mg | DELAYED_RELEASE_TABLET | Freq: Every day | ORAL | Status: DC

## 2015-06-04 MED ORDER — GABAPENTIN 300 MG PO CAPS: 300.0000 mg | ORAL_CAPSULE | Freq: Three times a day (TID) | ORAL | Status: DC

## 2015-06-04 MED ORDER — TRAZODONE HCL 50 MG PO TABS: 50.0000 mg | ORAL_TABLET | Freq: Every evening | ORAL | Status: DC | PRN

## 2015-06-04 MED ORDER — IPRATROPIUM-ALBUTEROL 20-100 MCG/ACT IN AERS: 1.0000 | INHALATION_SPRAY | Freq: Four times a day (QID) | RESPIRATORY_TRACT | Status: DC | PRN

## 2015-06-04 MED ORDER — FLUOXETINE HCL 20 MG PO CAPS: 20.0000 mg | ORAL_CAPSULE | Freq: Every day | ORAL | Status: DC

## 2015-06-04 MED ORDER — OXYCODONE-ACETAMINOPHEN 5-325 MG PO TABS
1.0000 | ORAL_TABLET | Freq: Once | ORAL | Status: AC
  Administered 2015-06-04: 1 via ORAL
  Filled 2015-06-04: qty 1

## 2015-06-04 MED ORDER — OXYCODONE-ACETAMINOPHEN 5-325 MG PO TABS: 1.0000 | ORAL_TABLET | Freq: Once | ORAL | Status: DC

## 2015-06-04 MED ORDER — ADULT MULTIVITAMIN W/MINERALS CH: 1.0000 | ORAL_TABLET | Freq: Every day | ORAL | Status: DC

## 2015-06-04 MED ORDER — METOPROLOL TARTRATE 25 MG PO TABS: 25.0000 mg | ORAL_TABLET | Freq: Every day | ORAL | Status: DC
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
## 2015-06-04 NOTE — Progress Notes (Signed)
[facility] MD Progress Note  06/04/2015 1:56 PM Samuel Reyes  MRN:  [id number]   On Evaluation:  Patient attending group sessions. Patients is awake and alert, with complaints of  Pain, anxiety and depression. States he is feeling anxious about his discharge. Reports he has to be discharged early to go to [facility] and then go to [facility] by 0800 am. Reports he has not been eating well do to his Jaw fracture reports is pain is 10/10 and the ibuprofen is not helping. Reports resting well with medication, however due to the recent facial pain reports having a difficult time staying asleep. Patient reports learning " to be patient with other" and "too think things out better."  Denies suicidal or homicidal ideation. Support, encouragement and reassurance provided. Principal Problem: MDD (major depressive disorder), recurrent severe, without psychosis ([place]) Diagnosis:   Patient Active Problem List   Diagnosis Date Noted  . MDD (major depressive disorder), recurrent severe, without psychosis ([place]) [F33.2] 06/01/2015  . Hypochloremia [E87.8]   . Epigastric pain [R10.13]   . Hepatic steatosis [K76.0] 02/05/2015  . Abdominal pain [R10.9] 02/05/2015  . Pressure ulcer [L89.90] 02/05/2015  . Protein-calorie malnutrition, severe ([place]) [E43] 01/19/2015  . Seizures ([place]) [R56.9] 01/19/2015  . New onset seizure ([place]) [R56.9]   . Chest pain [R07.9] 01/05/2015  . Suicidal ideation [R45.851] 01/05/2015  . GERD without esophagitis [K21.9] 01/05/2015  . Persistent vomiting [R11.10] 08/31/2014  . HCAP (healthcare-associated pneumonia) [J18.9] 08/31/2014  . Hypokalemia [E87.6] 08/31/2014  . Hyponatremia [E87.1] 08/31/2014  . Major depressive disorder, recurrent, severe without psychotic features ([place]) [F33.2]   . Alcohol dependence with uncomplicated withdrawal ([place]) [F10.230]   . Suicidal ideations [R45.851] 07/21/2014  . Drug overdose [T50.901A] 07/21/2014  . Fever [R50.9]   . Overdose [T50.901A] 07/20/2014   . S/P alcohol detoxification [Z09] 04/15/2014  . Alcohol dependence ([place]) [F10.20] 03/09/2014  . Major depression, recurrent ([place]) [F33.9] 03/09/2014  . PTSD (post-traumatic stress disorder) [F43.10] 03/09/2014  . Recurrent ventral incisional hernia [K43.2] 01/20/2014  . Incisional hernia, without obstruction or gangrene [K43.2] 11/26/2013  . Colon polyps [K63.5] 11/26/2013  . DJD (degenerative joint disease) [M19.90] 10/28/2013  . Smoking [Z72.0] 10/28/2013  . Knee pain [M25.569] 06/15/2013  . Essential hypertension [I10] 02/17/2013  . FH: lung cancer [Z80.1] 02/17/2013  . COPD (chronic obstructive pulmonary disease) ([place]) [J44.9] 02/17/2013  . Smoker [Z72.0] 02/17/2013  . GERD (gastroesophageal reflux disease) [K21.9] 02/17/2013  . Hiatal hernia [K44.9] 02/17/2013  . Bereavement [Z63.4] 02/02/2013   Total Time spent with patient: 30 minutes  Past Psychiatric History: see admission H and P  Past Medical History:  Past Medical History  Diagnosis Date  . Cirrhosis ([place])   . Degenerative joint disease   . Hypertension   . Gastric ulcer   . Depression   . Asthma   . Mitral valve prolapse 2002  . H/O hiatal hernia   . COPD (chronic obstructive pulmonary disease) ([place])   . TIA (transient ischemic attack)     2010  . Stroke [facility])     TIA - 2010 - no deficits   . Shortness of breath     with exertion   . GERD (gastroesophageal reflux disease)   . Headache(784.0)   . Hiatal hernia 1982  . Degenerative joint disease   . Anxiety   . Alcohol abuse     Past Surgical History  Procedure Laterality Date  . Gastrectomy    . Shoulder surgery Bilateral     3 surgeries  on on left, 2 surgeries on right   . Rt knee arthroscopic surgery    . Back surgery      3 cervical spine surgeries C4-C5 fused  . Hernia repair    . Finger surgery Left     2nd, 3rd, & 4th fingers were cut off by table saw and reattached  . Colonoscopy N/A 01/04/2014    Procedure: COLONOSCOPY;  Surgeon: Danie Binder, MD;  Location: [facility] ENDO [facility];  Service: Endoscopy;  Laterality: N/A;  1:45  . Esophagogastroduodenoscopy N/A 01/04/2014    Procedure: ESOPHAGOGASTRODUODENOSCOPY (EGD);  Surgeon: Danie Binder, MD;  Location: [facility] ENDO SUITE;  Service: Endoscopy;  Laterality: N/A;  . Incisional hernia repair N/A 01/20/2014    Procedure: LAPAROSCOPIC RECURRENT  INCISIONAL HERNIA with mesh;  Surgeon: Edward Jolly, MD;  Location: [facility] ORS;  Service: General;  Laterality: N/A;   Family History:  Family History  Problem Relation Age of Onset  . Cancer Father     bone  . Cancer Brother     lungs  . Stroke Maternal Grandmother   . Colon cancer Neg Hx   . Asthma Son     died at age 76 in his sleep   . Spina bifida Son     died at age 23    Family Psychiatric  History: see admission H and P Social History:  History  Alcohol Use  . 3.6 oz/week  . 6 Cans of beer per week    Comment: 1 case of beer and 1 shot of vodka between 1030am-1430pm     History  Drug Use No    Comment: denid using any drugs    Social History   Social History  . Marital Status: Widowed    Spouse Name: N/A  . Number of Children: N/A  . Years of Education: N/A   Occupational History  . Disability    Social History Main Topics  . Smoking status: Current Every Day Smoker -- 1.00 packs/day for 45 years    Types: Cigarettes    Start date: 07/30/1966  . Smokeless tobacco: Never Used  . Alcohol Use: 3.6 oz/week    6 Cans of beer per week     Comment: 1 case of beer and 1 shot of vodka between 1030am-1430pm  . Drug Use: No     Comment: denid using any drugs  . Sexual Activity: Not Currently   Other Topics Concern  . None   Social History Narrative   Additional Social History:    History of alcohol / drug use?: Yes Negative Consequences of Use: Financial, Personal relationships         Sleep: Fair  Appetite:  Fair (Reports to due facial pain unable to chew well)  Current Medications: Current  Facility-Administered Medications  Medication Dose Route Frequency Provider Last Rate Last Dose  . albuterol (PROVENTIL HFA;VENTOLIN HFA) 108 (90 BASE) MCG/ACT inhaler 2 puff  2 puff Inhalation Q6H PRN Nicholaus Bloom, MD   2 puff at 06/02/15 1706  . alum & mag hydroxide-simeth (MAALOX/MYLANTA) 200-200-20 MG/5ML suspension 30 mL  30 mL Oral PRN Patrecia Pour, NP      . clindamycin (CLEOCIN) capsule 300 mg  300 mg Oral TID Benjamine Mola, FNP   300 mg at 06/04/15 1124  . feeding supplement (BOOST / RESOURCE BREEZE) liquid 1 Container  1 Container Oral TID BM Nicholaus Bloom, MD   1 Container at 06/04/15 1124  . FLUoxetine (PROZAC)  capsule 20 mg  20 mg Oral Daily Nicholaus Bloom, MD   20 mg at 06/04/15 3716  . gabapentin (NEURONTIN) capsule 300 mg  300 mg Oral TID Benjamine Mola, FNP   300 mg at 06/04/15 1124  . gi cocktail (Maalox,Lidocaine,Donnatal)  30 mL Oral BID PRN Nicholaus Bloom, MD      . hydrOXYzine (ATARAX/VISTARIL) tablet 50 mg  50 mg Oral Q6H PRN Nicholaus Bloom, MD   50 mg at 06/03/15 2135  . ibuprofen (ADVIL,MOTRIN) tablet 800 mg  800 mg Oral Q8H PRN Nicholaus Bloom, MD   800 mg at 06/04/15 0817  . Ipratropium-Albuterol (COMBIVENT) respimat 1 puff  1 puff Inhalation Q6H PRN Nicholaus Bloom, MD   1 puff at 06/03/15 0804  . LORazepam (ATIVAN) tablet 1 mg  1 mg Oral Q8H PRN Patrecia Pour, NP   1 mg at 06/03/15 1343  . metoprolol tartrate (LOPRESSOR) tablet 25 mg  25 mg Oral QAC breakfast Benjamine Mola, FNP   25 mg at 06/04/15 9678  . multivitamin with minerals tablet 1 tablet  1 tablet Oral Daily Benjamine Mola, FNP   1 tablet at 06/04/15 9381  . nicotine (NICODERM CQ - dosed in mg/24 hours) patch 21 mg  21 mg Transdermal Daily Patrecia Pour, NP   21 mg at 06/03/15 0803  . ondansetron (ZOFRAN) tablet 4 mg  4 mg Oral Q8H PRN Patrecia Pour, NP      . pantoprazole (PROTONIX) EC tablet 80 mg  80 mg Oral Daily Benjamine Mola, FNP   80 mg at 06/04/15 0175  . traZODone (DESYREL) tablet 50 mg  50 mg Oral  QHS,MR X 1 Benjamine Mola, FNP   50 mg at 06/03/15 2135    Lab Results: No results found for this or any previous visit (from the past 48 hour(s)).  Physical Findings: AIMS: Facial and Oral Movements Muscles of Facial Expression: None, normal Lips and Perioral Area: None, normal Jaw: None, normal Tongue: None, normal,Extremity Movements Upper (arms, wrists, hands, fingers): None, normal Lower (legs, knees, ankles, toes): None, normal, Trunk Movements Neck, shoulders, hips: None, normal, Overall Severity Severity of abnormal movements (highest score from questions above): None, normal Incapacitation due to abnormal movements: None, normal Patient's awareness of abnormal movements (rate only patient's report): No Awareness, Dental Status Current problems with teeth and/or dentures?: No (no teeth) Does patient usually wear dentures?: Yes (not with pt at hospital)  CIWA:  CIWA-Ar Total: 0 COWS:     Musculoskeletal: Strength & Muscle Tone: within normal limits Gait & Station: normal Patient leans: normal  Psychiatric Specialty Exam: Review of Systems  Constitutional: Negative.   HENT:       Jaw fracture Pt is c/o of increased pain 10/10 and is unable to eat due to pain  Eyes: Negative.   Respiratory: Negative.   Cardiovascular: Negative.   Gastrointestinal: Negative.   Genitourinary: Negative.   Musculoskeletal: Positive for joint pain.  Skin: Negative.   Neurological: Negative.   Endo/Heme/Allergies: Negative.   Psychiatric/Behavioral: Positive for depression and substance abuse. The patient is nervous/anxious.     Blood pressure 133/74, pulse 90, temperature 98.4 F (36.9 C), temperature source Oral, resp. rate 20, height 5\' 3"  (1.6 m), weight 64.411 kg (142 lb), SpO2 100 %.Body mass index is 25.16 kg/(m^2).  General Appearance: Casual, Fairly Groomed and Neat  Eye Contact::  Fair  Speech:  Clear and Coherent  Volume:  Normal  Mood:  Anxious and Depressed  Affect:   anxious worried  Thought Process:  Coherent and Goal Directed  Orientation:  Full (Time, Place, and Person)  Thought Content:  Hallucinations: None and Increased Axiety  Suicidal Thoughts:  No  Homicidal Thoughts:  No  Memory:  Immediate;   Fair Recent;   Fair Remote;   Fair  Judgement:  Fair  Insight:  Present and Shallow  Psychomotor Activity:  Normal  Concentration:  Fair  Recall:  [employer] of Knowledge:Fair  Language: Fair  Akathisia:  No  Handed:  Right  AIMS (if indicated):     Assets:  Desire for Improvement  ADL's:  Intact  Cognition: WNL  Sleep:  Number of Hours: 5.5    I agree with current treatment plan.  Treatment Plan Summary:  Take Percocet 5/325 P.O. NOW order due to pain. One time order for Jaw Fracture/ Pain Daily contact with patient to assess and evaluate symptoms and progress in treatment and Medication management Supportive approach/coping skills Alcohol dependence; continue the Ativan detox protocol/work a relapse prevention plan Depression; will increase the Prozac to 20 mg daily Anxiety; will work with the Neurontin/Vistaril Will work with CBT/mindfulness Will facilitate being admitted to [facility] Monday AM   [place] 06/04/2015, 1:56 PM

## 2015-06-04 NOTE — Plan of Care (Signed)
Problem: Ineffective individual coping Goal: STG: Patient will remain free from self harm Outcome: Progressing Pt denies self harm thoughts.

## 2015-06-04 NOTE — [facility] Group Notes (Signed)
[place] Group Notes:  (Clinical Social Work)   05/28/2015     10:00-11:00AM  Summary of Progress/Problems:   In today's process group a decisional balance exercise was used to explore in depth the perceived benefits and costs of alcohol and drugs, as well as the  benefits and costs of replacing these with healthy coping skills.  Patients listed healthy and unhealthy coping techniques, determining with CSW guidance that unhealthy coping techniques work initially, but eventually become harmful.  Motivational Interviewing and the whiteboard were utilized for the exercises.  The patient expressed that the unhealthy coping he often uses is drinking plus he hangs out with people who do so.  He participated fully in group.  Type of Therapy:  Group Therapy - Process   Participation Level:  Active  Participation Quality:  Attentive and Sharing  Affect:  Blunted  Cognitive:  Appropriate  Insight:  Developing/Improving  Engagement in Therapy:  Engaged  Modes of Intervention:  Education, Motivational Interviewing  Selmer Dominion, LCSW 06/04/2015, 12:41 PM

## 2015-06-04 NOTE — Progress Notes (Signed)
D:Pt rates depression and anxiety as a 5 on 0-10 scale with 10 being the most. Pt is pleasant and interacting on the unit. Pt c/o rt and lt jaw pain that radiates down his neck.  A:Offered support, encouragement and 15 minute checks. R:Pt denies si and hi. Safety maintained on the unit.

## 2015-06-04 NOTE — Progress Notes (Signed)
Patient did attend the evening speaker AA meeting.  

## 2015-06-04 NOTE — Progress Notes (Signed)
[place] Group Notes:  (Nursing/MHT/Case Management/Adjunct)  Date:  06/04/2015  Time:  0930 Type of Therapy:  Nurse Education  Participation Level:  Active  Participation Quality:  Appropriate and Sharing  Affect:  Anxious  Cognitive:  Alert, Appropriate and Oriented  Insight:  Improving  Engagement in Group:  Developing/Improving and Engaged  Modes of Intervention:  Discussion, Education, Socialization and Support  Summary of Progress/Problems:Pt's goal for today is to talk with his sister about bringing him clothes and prepare for discharge on Monday. [name] 06/04/2015, 11:17 AM
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
## 2015-06-05 NOTE — Progress Notes (Signed)
Informational note ; Pt for discharge 6a tomorrow morning , step dad Evelena Peat will be picking pt up.Medications samples ready [facility].

## 2015-06-05 NOTE — [facility] Group Notes (Signed)
[facility] Group Notes:  (Clinical Social Work)  06/05/2015  10:00-11:00AM  Summary of Progress/Problems:   The main focus of today's process group was to   1)  discuss the importance of adding supports  2)  define healthy supports versus unhealthy supports  3)  identify the patient's current level of supports and barriers to find more  4)  generate ideas for supports to add  An emphasis was placed on using counselor, doctor, therapy groups, 12-step groups, and problem-specific support groups to expand supports.    The patient expressed full comprehension of the concepts presented, and agreed that there is a need to add more supports.  The patient stated he looks forward to going to [facility] for additional support.  Type of Therapy:  Process Group with Motivational Interviewing  Participation Level:  Active  Participation Quality:  Attentive  Affect:  Blunted  Cognitive:  Oriented  Insight:  Engaged  Engagement in Therapy:  Engaged  Modes of Intervention:   Education, Support and Processing, Activity  Selmer Dominion, LCSW 06/05/2015
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
## 2015-06-05 NOTE — Progress Notes (Signed)
D.  Pt pleasant on approach, denies complaints at this time.  Positive for evening AA group, interacting appropriately with peers on the unit.  Looking forward to discharge in AM to [facility].   Denies SI/HI/hallucinations at this time.  A.  Support and encouragement offered.  R.  Pt remains safe on the unit, will continue to monitor.

## 2015-06-05 NOTE — BHH Group Notes (Signed)
[facility] Group Notes:  (Nursing/MHT/Case Management/Adjunct)  Date:  06/05/2015  Time: 09:30 am  Type of Therapy:  Nurse Education  Participation Level:  Active  Participation Quality:  Appropriate and Attentive  Affect:  Anxious and Appropriate  Cognitive:  Alert and Appropriate  Insight:  Appropriate and Improving  Engagement in Group:  Developing/Improving  Modes of Intervention:  Discussion and Education  Summary of Progress/Problems: Group discussion on healthy support systems. Pt shared how his drinking had drove his family away but yesterday his sister started taking to him again because he's getting help.  Jaynie Bream 06/05/2015, 12:14 PM

## 2015-06-05 NOTE — Progress Notes (Signed)
Patient did attend the evening speaker AA meeting.  

## 2015-06-06 NOTE — Plan of Care (Signed)
Problem: Diagnosis: Increased Risk For Suicide Attempt Goal: LTG-Patient Will Report Improved Mood and Deny Suicidal LTG (by discharge) Patient will report improved mood and deny suicidal ideation.  Outcome: Progressing Pt has had improved mood and has denied suicidal ideation this weekend.  Also less complaint of anxiety.

## 2015-06-06 NOTE — Progress Notes (Signed)
Pt discharged with his medication samples and discharge information including directions to [facility].  Pt states his step-father knows where it is but he wanted the directions just to be sure he gets there in time.  Pt also given prescriptions and collected his belongings from his room as well as his locker (19).  Pt denied suicidal ideation of any kind.  Pt verbalized understanding of discharge instructions and denied questions at this time.  Pt is to be picked up by his Step-father this morning.

## 2015-06-09 ENCOUNTER — Emergency Department (HOSPITAL_BASED_OUTPATIENT_CLINIC_OR_DEPARTMENT_OTHER)
Admission: EM | Admit: 2015-06-09 | Discharge: 2015-06-09 | Disposition: A | Discharge status: Home / Self Care | Payer: Medicaid Other | Attending: Emergency Medicine | Admitting: Emergency Medicine

## 2015-06-09 ENCOUNTER — Encounter (HOSPITAL_BASED_OUTPATIENT_CLINIC_OR_DEPARTMENT_OTHER): Payer: Self-pay | Admitting: *Deleted

## 2015-06-09 DIAGNOSIS — Z4802 Encounter for removal of sutures: Secondary | ICD-10-CM | POA: Insufficient documentation

## 2015-06-09 DIAGNOSIS — J449 Chronic obstructive pulmonary disease, unspecified: Secondary | ICD-10-CM | POA: Diagnosis not present

## 2015-06-09 DIAGNOSIS — F419 Anxiety disorder, unspecified: Secondary | ICD-10-CM | POA: Diagnosis not present

## 2015-06-09 DIAGNOSIS — Z8739 Personal history of other diseases of the musculoskeletal system and connective tissue: Secondary | ICD-10-CM | POA: Diagnosis not present

## 2015-06-09 DIAGNOSIS — Z792 Long term (current) use of antibiotics: Secondary | ICD-10-CM | POA: Insufficient documentation

## 2015-06-09 DIAGNOSIS — K219 Gastro-esophageal reflux disease without esophagitis: Secondary | ICD-10-CM | POA: Diagnosis not present

## 2015-06-09 DIAGNOSIS — Z8673 Personal history of transient ischemic attack (TIA), and cerebral infarction without residual deficits: Secondary | ICD-10-CM | POA: Insufficient documentation

## 2015-06-09 DIAGNOSIS — Z880 Allergy status to penicillin: Secondary | ICD-10-CM | POA: Diagnosis not present

## 2015-06-09 DIAGNOSIS — F329 Major depressive disorder, single episode, unspecified: Secondary | ICD-10-CM | POA: Insufficient documentation

## 2015-06-09 DIAGNOSIS — Z720 Tobacco use: Secondary | ICD-10-CM | POA: Diagnosis not present

## 2015-06-09 DIAGNOSIS — Z79899 Other long term (current) drug therapy: Secondary | ICD-10-CM | POA: Diagnosis not present

## 2015-06-09 DIAGNOSIS — I10 Essential (primary) hypertension: Secondary | ICD-10-CM | POA: Insufficient documentation

## 2015-06-09 NOTE — ED Provider Notes (Signed)
CSN: [id number]     Arrival date & time 06/09/15  1513 History   None    Chief Complaint  Patient presents with  . Suture / Staple Removal     (Consider location/radiation/quality/duration/timing/severity/associated sxs/prior Treatment) HPI Comments: Pt comes in for suture removal for sutures to his chin from 9 days ago. Denies drainage redness or warmth from the area. Has good sensation  The history is provided by the patient. No language interpreter was used.    Past Medical History  Diagnosis Date  . Cirrhosis ([place])   . Degenerative joint disease   . Hypertension   . Gastric ulcer   . Depression   . Asthma   . Mitral valve prolapse 2002  . H/O hiatal hernia   . COPD (chronic obstructive pulmonary disease) ([place])   . TIA (transient ischemic attack)     2010  . Stroke [facility])     TIA - 2010 - no deficits   . Shortness of breath     with exertion   . GERD (gastroesophageal reflux disease)   . Headache(784.0)   . Hiatal hernia 1982  . Degenerative joint disease   . Anxiety   . Alcohol abuse    Past Surgical History  Procedure Laterality Date  . Gastrectomy    . Shoulder surgery Bilateral     3 surgeries on on left, 2 surgeries on right   . Rt knee arthroscopic surgery    . Back surgery      3 cervical spine surgeries C4-C5 fused  . Hernia repair    . Finger surgery Left     2nd, 3rd, & 4th fingers were cut off by table saw and reattached  . Colonoscopy N/A 01/04/2014    Procedure: COLONOSCOPY;  Surgeon: Danie Binder, MD;  [facility];  Service: Endoscopy;  Laterality: N/A;  1:45  . Esophagogastroduodenoscopy N/A 01/04/2014    Procedure: ESOPHAGOGASTRODUODENOSCOPY (EGD);  Surgeon: Danie Binder, MD;  Location: [facility];  Service: Endoscopy;  Laterality: N/A;  . Incisional hernia repair N/A 01/20/2014    Procedure: LAPAROSCOPIC RECURRENT  INCISIONAL HERNIA with mesh;  Surgeon: Edward Jolly, MD;  Location: [facility] ORS;  Service: General;  Laterality:  N/A;   Family History  Problem Relation Age of Onset  . Cancer Father     bone  . Cancer Brother     lungs  . Stroke Maternal Grandmother   . Colon cancer Neg Hx   . Asthma Son     died at age 5 in his sleep   . Spina bifida Son     died at age 31    Social History  Substance Use Topics  . Smoking status: Current Every Day Smoker -- 1.00 packs/day for 45 years    Types: Cigarettes    Start date: 07/30/1966  . Smokeless tobacco: Never Used  . Alcohol Use: 3.6 oz/week    6 Cans of beer per week     Comment: 1 case of beer and 1 shot of vodka between 1030am-1430pm    Review of Systems  Constitutional: Negative.   Respiratory: Negative.   Cardiovascular: Negative.       Allergies  Bee venom and Penicillins  Home Medications   Prior to Admission medications   Medication Sig Start Date End Date Taking? Authorizing Provider  clindamycin (CLEOCIN) 300 MG capsule Take 1 capsule (300 mg total) by mouth 3 (three) times daily. 06/04/15  Yes Derrill Center, NP  FLUoxetine (PROZAC) 20 MG capsule Take 1 capsule (20 mg total) by mouth daily. 06/04/15  Yes Derrill Center, NP  gabapentin (NEURONTIN) 300 MG capsule Take 1 capsule (300 mg total) by mouth 3 (three) times daily. 06/04/15  Yes Derrill Center, NP  metoprolol tartrate (LOPRESSOR) 25 MG tablet Take 1 tablet (25 mg total) by mouth daily before breakfast. 06/04/15  Yes Derrill Center, NP  Multiple Vitamin (MULTIVITAMIN WITH MINERALS) TABS tablet Take 1 tablet by mouth daily. 06/04/15  Yes Derrill Center, NP  pantoprazole (PROTONIX) 40 MG tablet Take 2 tablets (80 mg total) by mouth daily. 06/04/15  Yes Derrill Center, NP  traZODone (DESYREL) 50 MG tablet Take 1 tablet (50 mg total) by mouth at bedtime and may repeat dose one time if needed. 06/04/15  Yes Derrill Center, NP  Ipratropium-Albuterol (COMBIVENT) 20-100 MCG/ACT AERS respimat Inhale 1 puff into the lungs every 6 (six) hours as needed for wheezing. 06/04/15   Derrill Center, NP    BP 122/83 mmHg  Pulse 75  Temp(Src) 98.1 F (36.7 C) (Oral)  Resp 18  Ht 5\' 4"  (1.626 m)  Wt 144 lb (65.318 kg)  BMI 24.71 kg/m2  SpO2 100% Physical Exam  Constitutional: He appears well-developed and well-nourished.  HENT:  Well healed wound to under the chin. No redness or drainage  Cardiovascular: Normal rate and regular rhythm.   Pulmonary/Chest: Effort normal and breath sounds normal.  Nursing note and vitals reviewed.   ED Course  .Suture Removal Date/Time: 06/09/2015 3:57 PM Performed by: Glendell Docker Authorized by: Glendell Docker Consent: Verbal consent obtained. Consent given by: patient Patient identity confirmed: verbally with patient Body area: head/neck Location details: chin Sutures Removed: 5 Post-removal: dressing applied Facility: sutures placed in this facility Patient tolerance: Patient tolerated the procedure well with no immediate complications   (including critical care time) Labs Review Labs Reviewed - No data to display  Imaging Review No results found. I have personally reviewed and evaluated these images and lab results as part of my medical decision-making.   EKG Interpretation None      MDM   Final diagnoses:  Visit for suture removal    No sign of infection. Discussed wound care   Glendell Docker, NP 06/09/15 1557  Davonna Belling, MD 06/09/15 2252

## 2015-06-09 NOTE — ED Notes (Signed)
Patient here for suture removal from chin from 05/31/15.

## 2015-06-09 NOTE — Discharge Instructions (Signed)

## 2015-06-15 ENCOUNTER — Emergency Department (HOSPITAL_BASED_OUTPATIENT_CLINIC_OR_DEPARTMENT_OTHER)
Admission: EM | Admit: 2015-06-15 | Discharge: 2015-06-15 | Disposition: A | Discharge status: Home / Self Care | Payer: Medicaid Other | Attending: Emergency Medicine | Admitting: Emergency Medicine

## 2015-06-15 ENCOUNTER — Encounter (HOSPITAL_BASED_OUTPATIENT_CLINIC_OR_DEPARTMENT_OTHER): Payer: Self-pay

## 2015-06-15 ENCOUNTER — Emergency Department (HOSPITAL_BASED_OUTPATIENT_CLINIC_OR_DEPARTMENT_OTHER): Payer: Medicaid Other

## 2015-06-15 ENCOUNTER — Other Ambulatory Visit: Payer: Self-pay

## 2015-06-15 DIAGNOSIS — W228XXA Striking against or struck by other objects, initial encounter: Secondary | ICD-10-CM | POA: Diagnosis not present

## 2015-06-15 DIAGNOSIS — S2231XA Fracture of one rib, right side, initial encounter for closed fracture: Secondary | ICD-10-CM

## 2015-06-15 DIAGNOSIS — M1990 Unspecified osteoarthritis, unspecified site: Secondary | ICD-10-CM | POA: Insufficient documentation

## 2015-06-15 DIAGNOSIS — Y9389 Activity, other specified: Secondary | ICD-10-CM | POA: Insufficient documentation

## 2015-06-15 DIAGNOSIS — Z79899 Other long term (current) drug therapy: Secondary | ICD-10-CM | POA: Diagnosis not present

## 2015-06-15 DIAGNOSIS — I10 Essential (primary) hypertension: Secondary | ICD-10-CM | POA: Diagnosis not present

## 2015-06-15 DIAGNOSIS — J449 Chronic obstructive pulmonary disease, unspecified: Secondary | ICD-10-CM | POA: Diagnosis not present

## 2015-06-15 DIAGNOSIS — Z792 Long term (current) use of antibiotics: Secondary | ICD-10-CM | POA: Insufficient documentation

## 2015-06-15 DIAGNOSIS — Z8719 Personal history of other diseases of the digestive system: Secondary | ICD-10-CM | POA: Insufficient documentation

## 2015-06-15 DIAGNOSIS — R079 Chest pain, unspecified: Secondary | ICD-10-CM | POA: Diagnosis present

## 2015-06-15 DIAGNOSIS — Y998 Other external cause status: Secondary | ICD-10-CM | POA: Insufficient documentation

## 2015-06-15 DIAGNOSIS — Z880 Allergy status to penicillin: Secondary | ICD-10-CM | POA: Diagnosis not present

## 2015-06-15 DIAGNOSIS — Z8673 Personal history of transient ischemic attack (TIA), and cerebral infarction without residual deficits: Secondary | ICD-10-CM | POA: Insufficient documentation

## 2015-06-15 DIAGNOSIS — F329 Major depressive disorder, single episode, unspecified: Secondary | ICD-10-CM | POA: Diagnosis not present

## 2015-06-15 DIAGNOSIS — Y9289 Other specified places as the place of occurrence of the external cause: Secondary | ICD-10-CM | POA: Insufficient documentation

## 2015-06-15 DIAGNOSIS — F419 Anxiety disorder, unspecified: Secondary | ICD-10-CM | POA: Diagnosis not present

## 2015-06-15 DIAGNOSIS — F17210 Nicotine dependence, cigarettes, uncomplicated: Secondary | ICD-10-CM | POA: Insufficient documentation

## 2015-06-15 DIAGNOSIS — S2241XA Multiple fractures of ribs, right side, initial encounter for closed fracture: Secondary | ICD-10-CM | POA: Insufficient documentation

## 2015-06-15 LAB — BASIC METABOLIC PANEL
ANION GAP: 6 (ref 5–15)
BUN: 15 mg/dL (ref 6–20)
CHLORIDE: 105 mmol/L (ref 101–111)
CO2: 26 mmol/L (ref 22–32)
Calcium: 8.9 mg/dL (ref 8.9–10.3)
Creatinine, Ser: 0.91 mg/dL (ref 0.61–1.24)
GFR calc Af Amer: >60 mL/min (ref >60)
GFR calc non Af Amer: >60 mL/min (ref >60)
Glucose, Bld: 84 mg/dL (ref 65–99)
POTASSIUM: 3.6 mmol/L (ref 3.5–5.1)
SODIUM: 137 mmol/L (ref 135–145)

## 2015-06-15 LAB — CBC WITH DIFFERENTIAL/PLATELET
BASOS ABS: 0.1 10*3/uL (ref 0.0–0.1)
Basophils Relative: 1 %
EOS ABS: 0 10*3/uL (ref 0.0–0.7)
Eosinophils Relative: 0 %
HCT: 37.2 % — ABNORMAL LOW (ref 39.0–52.0)
HEMOGLOBIN: 11.9 g/dL — AB (ref 13.0–17.0)
LYMPHS ABS: 1.9 10*3/uL (ref 0.7–4.0)
LYMPHS PCT: 20 %
MCH: 29.4 pg (ref 26.0–34.0)
MCHC: 32 g/dL (ref 30.0–36.0)
MCV: 91.9 fL (ref 78.0–100.0)
Monocytes Absolute: 0.8 10*3/uL (ref 0.1–1.0)
Monocytes Relative: 9 %
NEUTROS PCT: 70 %
Neutro Abs: 6.9 10*3/uL (ref 1.7–7.7)
Platelets: 404 10*3/uL — ABNORMAL HIGH (ref 150–400)
RBC: 4.05 MIL/uL — AB (ref 4.22–5.81)
RDW: 17.5 % — ABNORMAL HIGH (ref 11.5–15.5)
WBC: 9.7 10*3/uL (ref 4.0–10.5)

## 2015-06-15 LAB — TROPONIN I: Troponin I: <0.03 ng/mL (ref <0.031)

## 2015-06-15 LAB — BRAIN NATRIURETIC PEPTIDE: B NATRIURETIC PEPTIDE 5: 41.3 pg/mL (ref 0.0–100.0)

## 2015-06-15 MED ORDER — NAPROXEN 500 MG PO TABS: 500.0000 mg | ORAL_TABLET | Freq: Two times a day (BID) | ORAL | Status: DC

## 2015-06-15 MED ORDER — ALBUTEROL SULFATE HFA 108 (90 BASE) MCG/ACT IN AERS: 1.0000 | INHALATION_SPRAY | Freq: Four times a day (QID) | RESPIRATORY_TRACT | Status: DC | PRN

## 2015-06-15 MED ORDER — ALBUTEROL SULFATE HFA 108 (90 BASE) MCG/ACT IN AERS: 1.0000 | INHALATION_SPRAY | RESPIRATORY_TRACT | Status: DC | PRN
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
## 2015-06-15 NOTE — Discharge Instructions (Signed)
Take the prescribed medication as directed.  You may continue to have some soreness on your right side due to your rib fractures. Continue to use incentive spirometer as directed. Follow-up with your primary care physician. Return to the ED for new or worsening symptoms.

## 2015-06-15 NOTE — ED Notes (Addendum)
CP since last Tuesday-pt at [facility] for ETOH abuse

## 2015-06-15 NOTE — ED Notes (Signed)
Patient preparing for discharge. 

## 2015-06-15 NOTE — ED Notes (Signed)
Patient changed into a gown from waist up.

## 2015-06-15 NOTE — ED Notes (Signed)
PA at bedside.

## 2015-09-02 ENCOUNTER — Encounter (HOSPITAL_COMMUNITY): Payer: Self-pay

## 2015-09-02 ENCOUNTER — Inpatient Hospital Stay (HOSPITAL_COMMUNITY)
Admission: EM | Admit: 2015-09-02 | Discharge: 2015-09-07 | DRG: 304 | Disposition: A | Discharge status: Home / Self Care | Payer: Medicaid Other | Attending: Family Medicine | Admitting: Family Medicine

## 2015-09-02 ENCOUNTER — Inpatient Hospital Stay (HOSPITAL_COMMUNITY): Payer: Medicaid Other

## 2015-09-02 DIAGNOSIS — I10 Essential (primary) hypertension: Secondary | ICD-10-CM | POA: Diagnosis present

## 2015-09-02 DIAGNOSIS — F10230 Alcohol dependence with withdrawal, uncomplicated: Secondary | ICD-10-CM | POA: Diagnosis not present

## 2015-09-02 DIAGNOSIS — Z903 Acquired absence of stomach [part of]: Secondary | ICD-10-CM

## 2015-09-02 DIAGNOSIS — G4701 Insomnia due to medical condition: Secondary | ICD-10-CM | POA: Diagnosis not present

## 2015-09-02 DIAGNOSIS — B379 Candidiasis, unspecified: Secondary | ICD-10-CM | POA: Diagnosis present

## 2015-09-02 DIAGNOSIS — Z6826 Body mass index (BMI) 26.0-26.9, adult: Secondary | ICD-10-CM | POA: Diagnosis not present

## 2015-09-02 DIAGNOSIS — I160 Hypertensive urgency: Secondary | ICD-10-CM | POA: Diagnosis not present

## 2015-09-02 DIAGNOSIS — Z8673 Personal history of transient ischemic attack (TIA), and cerebral infarction without residual deficits: Secondary | ICD-10-CM

## 2015-09-02 DIAGNOSIS — Z981 Arthrodesis status: Secondary | ICD-10-CM | POA: Diagnosis not present

## 2015-09-02 DIAGNOSIS — K210 Gastro-esophageal reflux disease with esophagitis: Secondary | ICD-10-CM | POA: Diagnosis present

## 2015-09-02 DIAGNOSIS — Z638 Other specified problems related to primary support group: Secondary | ICD-10-CM | POA: Diagnosis not present

## 2015-09-02 DIAGNOSIS — J449 Chronic obstructive pulmonary disease, unspecified: Secondary | ICD-10-CM | POA: Diagnosis present

## 2015-09-02 DIAGNOSIS — F339 Major depressive disorder, recurrent, unspecified: Secondary | ICD-10-CM | POA: Diagnosis not present

## 2015-09-02 DIAGNOSIS — F1020 Alcohol dependence, uncomplicated: Secondary | ICD-10-CM | POA: Diagnosis not present

## 2015-09-02 DIAGNOSIS — F17210 Nicotine dependence, cigarettes, uncomplicated: Secondary | ICD-10-CM | POA: Diagnosis present

## 2015-09-02 DIAGNOSIS — E860 Dehydration: Secondary | ICD-10-CM | POA: Diagnosis present

## 2015-09-02 DIAGNOSIS — Z91030 Bee allergy status: Secondary | ICD-10-CM

## 2015-09-02 DIAGNOSIS — R000 Tachycardia, unspecified: Secondary | ICD-10-CM

## 2015-09-02 DIAGNOSIS — E43 Unspecified severe protein-calorie malnutrition: Secondary | ICD-10-CM

## 2015-09-02 DIAGNOSIS — R45851 Suicidal ideations: Secondary | ICD-10-CM | POA: Diagnosis present

## 2015-09-02 DIAGNOSIS — K760 Fatty (change of) liver, not elsewhere classified: Secondary | ICD-10-CM | POA: Diagnosis present

## 2015-09-02 DIAGNOSIS — K7010 Alcoholic hepatitis without ascites: Secondary | ICD-10-CM | POA: Diagnosis present

## 2015-09-02 DIAGNOSIS — Z590 Homelessness: Secondary | ICD-10-CM | POA: Diagnosis not present

## 2015-09-02 DIAGNOSIS — R55 Syncope and collapse: Secondary | ICD-10-CM | POA: Diagnosis present

## 2015-09-02 DIAGNOSIS — F10939 Alcohol use, unspecified with withdrawal, unspecified: Secondary | ICD-10-CM

## 2015-09-02 DIAGNOSIS — F10239 Alcohol dependence with withdrawal, unspecified: Secondary | ICD-10-CM | POA: Diagnosis present

## 2015-09-02 DIAGNOSIS — K7460 Unspecified cirrhosis of liver: Secondary | ICD-10-CM | POA: Diagnosis present

## 2015-09-02 DIAGNOSIS — F332 Major depressive disorder, recurrent severe without psychotic features: Secondary | ICD-10-CM | POA: Diagnosis present

## 2015-09-02 DIAGNOSIS — J45909 Unspecified asthma, uncomplicated: Secondary | ICD-10-CM | POA: Diagnosis present

## 2015-09-02 DIAGNOSIS — Z880 Allergy status to penicillin: Secondary | ICD-10-CM

## 2015-09-02 DIAGNOSIS — R7989 Other specified abnormal findings of blood chemistry: Secondary | ICD-10-CM

## 2015-09-02 DIAGNOSIS — Z9114 Patient's other noncompliance with medication regimen: Secondary | ICD-10-CM

## 2015-09-02 DIAGNOSIS — E0781 Sick-euthyroid syndrome: Secondary | ICD-10-CM | POA: Diagnosis present

## 2015-09-02 DIAGNOSIS — Z720 Tobacco use: Secondary | ICD-10-CM

## 2015-09-02 LAB — COMPREHENSIVE METABOLIC PANEL
ALBUMIN: 3.5 g/dL (ref 3.5–5.0)
ALK PHOS: 87 U/L (ref 38–126)
ALT: 19 U/L (ref 17–63)
ANION GAP: 16 — AB (ref 5–15)
AST: 35 U/L (ref 15–41)
BILIRUBIN TOTAL: 1 mg/dL (ref 0.3–1.2)
BUN: 9 mg/dL (ref 6–20)
CALCIUM: 8.6 mg/dL — AB (ref 8.9–10.3)
CHLORIDE: 103 mmol/L (ref 101–111)
CO2: 22 mmol/L (ref 22–32)
Creatinine, Ser: 0.99 mg/dL (ref 0.61–1.24)
GFR calc Af Amer: >60 mL/min (ref >60)
GFR calc non Af Amer: >60 mL/min (ref >60)
GLUCOSE: 99 mg/dL (ref 65–99)
Potassium: 4.8 mmol/L (ref 3.5–5.1)
Sodium: 141 mmol/L (ref 135–145)
Total Protein: 6.8 g/dL (ref 6.5–8.1)

## 2015-09-02 LAB — URINALYSIS, ROUTINE W REFLEX MICROSCOPIC
Bilirubin Urine: NEGATIVE
Glucose, UA: NEGATIVE mg/dL
HGB URINE DIPSTICK: NEGATIVE
Ketones, ur: 15 mg/dL — AB
LEUKOCYTES UA: NEGATIVE
Nitrite: NEGATIVE
PROTEIN: NEGATIVE mg/dL
SPECIFIC GRAVITY, URINE: 1.018 (ref 1.005–1.030)
pH: 5.5 (ref 5.0–8.0)

## 2015-09-02 LAB — CBC WITH DIFFERENTIAL/PLATELET
BASOS PCT: 1 %
Basophils Absolute: 0.1 10*3/uL (ref 0.0–0.1)
Eosinophils Absolute: 0 10*3/uL (ref 0.0–0.7)
Eosinophils Relative: 0 %
HEMATOCRIT: 45.1 % (ref 39.0–52.0)
HEMOGLOBIN: 15.4 g/dL (ref 13.0–17.0)
LYMPHS ABS: 1.8 10*3/uL (ref 0.7–4.0)
LYMPHS PCT: 20 %
MCH: 31.1 pg (ref 26.0–34.0)
MCHC: 34.1 g/dL (ref 30.0–36.0)
MCV: 91.1 fL (ref 78.0–100.0)
Monocytes Absolute: 0.7 10*3/uL (ref 0.1–1.0)
Monocytes Relative: 8 %
NEUTROS ABS: 6.2 10*3/uL (ref 1.7–7.7)
NEUTROS PCT: 71 %
Platelets: 367 10*3/uL (ref 150–400)
RBC: 4.95 MIL/uL (ref 4.22–5.81)
RDW: 15.9 % — ABNORMAL HIGH (ref 11.5–15.5)
WBC: 8.8 10*3/uL (ref 4.0–10.5)

## 2015-09-02 LAB — CBG MONITORING, ED: Glucose-Capillary: 74 mg/dL (ref 65–99)

## 2015-09-02 LAB — RAPID URINE DRUG SCREEN, HOSP PERFORMED
AMPHETAMINES: NOT DETECTED
BENZODIAZEPINES: NOT DETECTED
Barbiturates: NOT DETECTED
Cocaine: NOT DETECTED
OPIATES: NOT DETECTED
TETRAHYDROCANNABINOL: NOT DETECTED

## 2015-09-02 LAB — MRSA PCR SCREENING: MRSA by PCR: NEGATIVE

## 2015-09-02 LAB — PHOSPHORUS: Phosphorus: 3.4 mg/dL (ref 2.5–4.6)

## 2015-09-02 LAB — TSH: TSH: 0.347 u[IU]/mL — AB (ref 0.350–4.500)

## 2015-09-02 LAB — I-STAT TROPONIN, ED: Troponin i, poc: 0 ng/mL (ref 0.00–0.08)

## 2015-09-02 LAB — T4, FREE
FREE T4: 0.68 ng/dL (ref 0.61–1.12)
Free T4: 0.68 ng/dL (ref 0.61–1.12)

## 2015-09-02 LAB — MAGNESIUM: Magnesium: 1.5 mg/dL — ABNORMAL LOW (ref 1.7–2.4)

## 2015-09-02 LAB — ETHANOL: Alcohol, Ethyl (B): 36 mg/dL — ABNORMAL HIGH (ref <5)

## 2015-09-02 MED ORDER — LORAZEPAM 1 MG PO TABS: 0.0000 mg | ORAL_TABLET | Freq: Four times a day (QID) | ORAL | Status: DC

## 2015-09-02 MED ORDER — ALUM & MAG HYDROXIDE-SIMETH 200-200-20 MG/5ML PO SUSP
30.0000 mL | Freq: Four times a day (QID) | ORAL | Status: DC | PRN
  Administered 2015-09-03: 30 mL via ORAL
  Filled 2015-09-02: qty 30

## 2015-09-02 MED ORDER — FOLIC ACID 5 MG/ML IJ SOLN
1.0000 mg | Freq: Every day | INTRAMUSCULAR | Status: DC
  Administered 2015-09-02: 1 mg via INTRAVENOUS
  Filled 2015-09-02 (×2): qty 0.2

## 2015-09-02 MED ORDER — ACETAMINOPHEN 650 MG RE SUPP
650.0000 mg | Freq: Four times a day (QID) | RECTAL | Status: DC | PRN
Start: 2015-09-02 — End: 2015-09-07

## 2015-09-02 MED ORDER — SODIUM CHLORIDE 0.9 % IV SOLN
INTRAVENOUS | Status: DC
  Administered 2015-09-03 – 2015-09-04 (×3): via INTRAVENOUS

## 2015-09-02 MED ORDER — THIAMINE HCL 100 MG/ML IJ SOLN: 100.0000 mg | Freq: Every day | INTRAMUSCULAR | Status: DC

## 2015-09-02 MED ORDER — ACETAMINOPHEN 325 MG PO TABS
650.0000 mg | ORAL_TABLET | Freq: Four times a day (QID) | ORAL | Status: DC | PRN
  Administered 2015-09-02 – 2015-09-07 (×12): 650 mg via ORAL
  Filled 2015-09-02 (×12): qty 2

## 2015-09-02 MED ORDER — VITAMIN B-1 100 MG PO TABS
100.0000 mg | ORAL_TABLET | Freq: Every day | ORAL | Status: DC
  Administered 2015-09-02 – 2015-09-07 (×6): 100 mg via ORAL
  Filled 2015-09-02 (×6): qty 1

## 2015-09-02 MED ORDER — SODIUM CHLORIDE 0.9 % IV BOLUS (SEPSIS)
1000.0000 mL | Freq: Once | INTRAVENOUS | Status: AC
  Administered 2015-09-02: 1000 mL via INTRAVENOUS

## 2015-09-02 MED ORDER — HYDRALAZINE HCL 20 MG/ML IJ SOLN
10.0000 mg | Freq: Four times a day (QID) | INTRAMUSCULAR | Status: DC | PRN
Start: 2015-09-02 — End: 2015-09-07

## 2015-09-02 MED ORDER — ENOXAPARIN SODIUM 40 MG/0.4ML ~~LOC~~ SOLN
40.0000 mg | SUBCUTANEOUS | Status: DC
Start: 2015-09-02 — End: 2015-09-07
  Administered 2015-09-02 – 2015-09-06 (×5): 40 mg via SUBCUTANEOUS
  Filled 2015-09-02 (×5): qty 0.4

## 2015-09-02 MED ORDER — LORAZEPAM 1 MG PO TABS
0.0000 mg | ORAL_TABLET | Freq: Two times a day (BID) | ORAL | Status: DC
  Administered 2015-09-02: 1 mg via ORAL
  Filled 2015-09-02: qty 1

## 2015-09-02 MED ORDER — NICOTINE 21 MG/24HR TD PT24
21.0000 mg | MEDICATED_PATCH | Freq: Every day | TRANSDERMAL | Status: DC
  Administered 2015-09-02 – 2015-09-07 (×6): 21 mg via TRANSDERMAL
  Filled 2015-09-02 (×6): qty 1

## 2015-09-02 MED ORDER — LORAZEPAM 2 MG/ML IJ SOLN
2.0000 mg | Freq: Once | INTRAMUSCULAR | Status: AC
  Administered 2015-09-02: 2 mg via INTRAVENOUS
  Filled 2015-09-02: qty 1

## 2015-09-02 MED ORDER — SODIUM CHLORIDE 0.9% FLUSH
3.0000 mL | Freq: Two times a day (BID) | INTRAVENOUS | Status: DC
  Administered 2015-09-02 – 2015-09-07 (×9): 3 mL via INTRAVENOUS

## 2015-09-02 MED ORDER — CLONIDINE HCL 0.2 MG PO TABS
0.2000 mg | ORAL_TABLET | Freq: Three times a day (TID) | ORAL | Status: DC
  Administered 2015-09-02 – 2015-09-07 (×15): 0.2 mg via ORAL
  Filled 2015-09-02: qty 1
  Filled 2015-09-02 (×2): qty 2
  Filled 2015-09-02: qty 1
  Filled 2015-09-02: qty 2
  Filled 2015-09-02: qty 1
  Filled 2015-09-02 (×3): qty 2
  Filled 2015-09-02: qty 1
  Filled 2015-09-02 (×6): qty 2

## 2015-09-02 MED ORDER — LORAZEPAM 2 MG/ML IJ SOLN: 0.0000 mg | Freq: Four times a day (QID) | INTRAMUSCULAR | Status: DC

## 2015-09-02 MED ORDER — THIAMINE HCL 100 MG/ML IJ SOLN
100.0000 mg | Freq: Every day | INTRAMUSCULAR | Status: DC
  Filled 2015-09-02: qty 2

## 2015-09-02 MED ORDER — LORAZEPAM 2 MG/ML IJ SOLN
0.0000 mg | Freq: Two times a day (BID) | INTRAMUSCULAR | Status: AC
  Administered 2015-09-02: 2 mg via INTRAVENOUS
  Administered 2015-09-03: 1 mg via INTRAVENOUS
  Administered 2015-09-03: 2 mg via INTRAVENOUS
  Filled 2015-09-02 (×4): qty 1

## 2015-09-02 MED ORDER — DIAZEPAM 5 MG/ML IJ SOLN
5.0000 mg | INTRAMUSCULAR | Status: DC | PRN
  Administered 2015-09-02 – 2015-09-04 (×4): 5 mg via INTRAVENOUS
  Filled 2015-09-02 (×4): qty 2

## 2015-09-02 NOTE — ED Notes (Addendum)
Per EMS - pt witnessed syncopal episode - diaphoretic and dizziness prior to episode. Pt drinks daily - last drink 0300, usually drinks 24 beers/day. Denies CP. Still c/o dizziness, heartburn, nausea. Pt reports vomiting x 3 days. Denies abd pain. ST on monitor. Hx COPD and degenerative joint disease.

## 2015-09-02 NOTE — ED Notes (Signed)
Pt unable to obtain urine specimen at this time 
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
## 2015-09-02 NOTE — ED Notes (Signed)
Contacted lab - will add-on mag, phosphorous, ethanol

## 2015-09-02 NOTE — BH Assessment (Addendum)
Tele Assessment Note   Samuel Reyes is an 60 y.o. male that presents this date with some passive SI although when questioned patient stated, "I just have no desire to go on." Patient could not contract for safety when asked and stated," I have tried it before and can't say what I would do if I wasn't here." Patient did state that he was trying to drink himself to death and thought, "this would be the day."Patient reported that he was found at a local [employer] convenience store earlier this date reporting he thought he walked there and then passed out, waking up at [facility]. Patient was in active withdrawals at the time this writer assessed him with patient reporting, nausea and noticeable tremors. Patient reported that since his wife passed away three years ago patient has drank "almost every day," reporting drinking up to one case of 24-12oz beers daily for the last year. Patient did report one prior suicide attempt a week after his wife's death three years ago, where patient reported he sliced both wrists open receiving medical attention but denies any inpatient admission at that time. Patient has limited supports and reported both of his sons are also deceased with one living daughter that he is not in contact with. Patient stated he received services after his wife's death from [facility] where he saw Dr. Glennon Mac who prescribed medication/s (patient reported he thought the medications were Prozac and Trazodone). Patient admitted to taking the medications for over two years until he recently starting receiving services from [facility] three months ago. Patient stated he was again prescribed medications but only took them for two months. Patient states he also maintained his sobriety for one month during that time but soon relapsed and discontinued his medications stating, "what is the point of it." Patient was seen at [facility] on 05/31/15 where he presented with similar symptoms although per admission note, patient  endorsed SI with plan. Patient is open to a voluntary admission stating, "he needed help." Case was staffed with Markus Jarvis NP who agreed patient meet criteria for an inpatient admission. Case was also staffed with [facility] Serena Croissant RN who after reviewing patient's current vitals, noticed patient continued to exhibit a high heart rate and was in active withdrawal. This Probation officer contacted [place] and informed of AC's concern's. Livia Snellen was given this writer's contact information and was informed to also contact [place] once patient's vitals returned to acceptable criteria for inpatient admission.          Diagnosis: Recurrent Major Depressive D/O, recurrent F33.2  Alcohol dependence F10.20   Past Medical History:  Past Medical History  Diagnosis Date  . Cirrhosis ([place])   . Degenerative joint disease   . Hypertension   . Gastric ulcer   . Depression   . Asthma   . Mitral valve prolapse 2002  . H/O hiatal hernia   . COPD (chronic obstructive pulmonary disease) ([place])   . TIA (transient ischemic attack)     2010  . Stroke [facility])     TIA - 2010 - no deficits   . Shortness of breath     with exertion   . GERD (gastroesophageal reflux disease)   . Headache(784.0)   . Hiatal hernia 1982  . Degenerative joint disease   . Anxiety   . Alcohol abuse     Past Surgical History  Procedure Laterality Date  . Gastrectomy    . Shoulder surgery Bilateral     3 surgeries  on on left, 2 surgeries on right   . Rt knee arthroscopic surgery    . Back surgery      3 cervical spine surgeries C4-C5 fused  . Hernia repair    . Finger surgery Left     2nd, 3rd, & 4th fingers were cut off by table saw and reattached  . Colonoscopy N/A 01/04/2014    Procedure: COLONOSCOPY;  Surgeon: Danie Binder, MD;  [facility];  Service: Endoscopy;  Laterality: N/A;  1:45  . Esophagogastroduodenoscopy N/A 01/04/2014    Procedure: ESOPHAGOGASTRODUODENOSCOPY (EGD);  Surgeon: Danie Binder,  MD;  Location[facility];  Service: Endoscopy;  Laterality: N/A;  . Incisional hernia repair N/A 01/20/2014    Procedure: LAPAROSCOPIC RECURRENT  INCISIONAL HERNIA with mesh;  Surgeon: Edward Jolly, MD;  Location: [facility] ORS;  Service: General;  Laterality: N/A;    Family History:  Family History  Problem Relation Age of Onset  . Cancer Father     bone  . Cancer Brother     lungs  . Stroke Maternal Grandmother   . Colon cancer Neg Hx   . Asthma Son     died at age 85 in his sleep   . Spina bifida Son     died at age 55     Social History:  reports that he has been smoking Cigarettes.  He started smoking about 49 years ago. He has a 45 pack-year smoking history. He has never used smokeless tobacco. He reports that he drinks alcohol. He reports that he does not use illicit drugs.  Additional Social History:  Alcohol / Drug Use Pain Medications: See MAR Prescriptions: See MAR Over the Counter: See MAR History of alcohol / drug use?: Yes Longest period of sobriety (when/how long): Two months ago when patient was involved at [facility] patient stated he maintained his sobriety for the month of November but relapsed in December 2016. Negative Consequences of Use: Personal relationships Withdrawal Symptoms: Agitation, Nausea / Vomiting, Tremors, Sweats Substance #1 Name of Substance 1: Alcohol 1 - Age of First Use: 19 1 - Amount (size/oz): 12 oz beers 1 - Frequency: Daily 1 - Duration: Fot the last three years with the exception of one month in 2016. 1 - Last Use / Amount: 08/01/15   CIWA: CIWA-Ar BP: 153/97 mmHg Pulse Rate: (!) 129 Nausea and Vomiting: 3 Tactile Disturbances: none Tremor: moderate, with patient's arms extended Auditory Disturbances: not present Paroxysmal Sweats: barely perceptible sweating, palms moist Visual Disturbances: not present Anxiety: mildly anxious Headache, Fullness in Head: none present Agitation: normal activity Orientation and Clouding of  Sensorium: oriented and can do serial additions CIWA-Ar Total: 9 COWS:    PATIENT STRENGTHS: (choose at least two) General fund of knowledge Motivation for treatment/growth  Allergies:  Allergies  Allergen Reactions  . Bee Venom Anaphylaxis  . Penicillins Rash    Has patient had a PCN reaction causing immediate rash, facial/tongue/throat swelling, SOB or lightheadedness with hypotension: {Yes Has patient had a PCN reaction causing severe rash involving mucus membranes or skin necrosis: NO Has patient had a PCN reaction that required hospitalization {Yes Has patient had a PCN reaction occurring within the last 10 years: NO If all of the above answers are "NO", then may proceed with Cephalosporin use.     Home Medications:  (Not in a hospital admission)  OB/GYN Status:  No LMP for male patient.  General Assessment Data [facility] of Assessment: [facility] ED TTS Assessment:  In system Is this a Tele or Face-to-Face Assessment?: Tele Assessment Is this an Initial Assessment or a Re-assessment for this encounter?: Initial Assessment Marital status: Widowed [place] name: na Is patient pregnant?: No Pregnancy Status: No Living Arrangements: Alone Can pt return to current living arrangement?: Yes Admission Status: Voluntary Is patient capable of signing voluntary admission?: Yes Referral Source: Self/Family/Friend Insurance type: SSI  Medical Screening Exam ([place]) Medical Exam completed: Yes  Crisis Care Plan Living Arrangements: Alone Legal Guardian: Other: (None) Name of Psychiatrist: Dr. Delphia Grates Name of Therapist: None  Education Status Is patient currently in school?: No Current Grade: na Highest grade of school patient has completed: 8 Name of school: na Contact person: na  Risk to self with the past 6 months Suicidal Ideation: Yes-Currently Present (Passive but pt admitts he could not contract for safety ) Has patient been a risk to self within the  past 6 months prior to admission? : Yes (patient continues to use daily large amounts of ETOH) Suicidal Intent: No-Not Currently/Within Last 6 Months (pt. did state when asked about SI "he would figure it out") Has patient had any suicidal intent within the past 6 months prior to admission? : No Is patient at risk for suicide?: Yes Suicidal Plan?: No-Not Currently/Within Last 6 Months Has patient had any suicidal plan within the past 6 months prior to admission? : No Access to Means: No What has been your use of drugs/alcohol within the last 12 months?: daily use Previous Attempts/Gestures: Yes How many times?: 1 Other Self Harm Risks: Excessive ETOH use. Triggers for Past Attempts: Other (Comment) (Loss of wife three years ago) Intentional Self Injurious Behavior: Cutting (Pt. admitted to self harm three years ago) Comment - Self Injurious Behavior: Patient cut his wrist after death of wife Family Suicide History: No Recent stressful life event(s): Loss (Comment) (Loss of wife) Persecutory voices/beliefs?: No Depression: Yes Depression Symptoms: Despondent, Loss of interest in usual pleasures Substance abuse history and/or treatment for substance abuse?: Yes Suicide prevention information given to non-admitted patients: Not applicable  Risk to Others within the past 6 months Homicidal Ideation: No Does patient have any lifetime risk of violence toward others beyond the six months prior to admission? : No Thoughts of Harm to Others: No Current Homicidal Intent: No Current Homicidal Plan: No Access to Homicidal Means: No Identified Victim: na History of harm to others?: No Assessment of Violence: None Noted Violent Behavior Description: na Does patient have access to weapons?: No (Patient has weapons at his mother's home) Criminal Charges Pending?: No Does patient have a court date: No Is patient on probation?: No  Psychosis Hallucinations: None noted Delusions: None  noted  Mental Status Report Appearance/Hygiene: Unremarkable Eye Contact: Fair Motor Activity: Other (Comment) (In active withdrawals from ETOH) Speech: Slurred Level of Consciousness: Irritable Mood: Depressed Affect: Depressed Anxiety Level: Minimal Judgement: Unimpaired Orientation: Person, Place, Time Obsessive Compulsive Thoughts/Behaviors: None  Cognitive Functioning Concentration: Normal Memory: Recent Intact, Remote Intact IQ: Average Insight: Fair Impulse Control: Fair Appetite: Poor Weight Loss: 15 Weight Gain: 0 Sleep: Decreased Total Hours of Sleep: 4 Vegetative Symptoms: None  ADLScreening [facility] Assessment Services) Patient's cognitive ability adequate to safely complete daily activities?: Yes Patient able to express need for assistance with ADLs?: Yes Independently performs ADLs?: Yes (appropriate for developmental age)  Prior Inpatient Therapy Prior Inpatient Therapy: No Prior Therapy Dates: na Prior Therapy Facilty/Provider(s): [facility] Reason for Treatment: SA use, depression  Prior Outpatient Therapy Prior Outpatient Therapy:  Yes Prior Therapy Dates: Last three months [facility] Prior Therapy Facilty/Provider(s): [facility] for two years Reason for Treatment: Sa use, depression Does patient have an ACCT team?: No Does patient have Intensive In-House Services?  : No Does patient have [facility] services? : No Does patient have P4CC services?: No  ADL Screening (condition at time of admission) Patient's cognitive ability adequate to safely complete daily activities?: Yes Is the patient deaf or have difficulty hearing?: No Does the patient have difficulty seeing, even when wearing glasses/contacts?: No Does the patient have difficulty concentrating, remembering, or making decisions?: Yes Patient able to express need for assistance with ADLs?: Yes Does the patient have difficulty dressing or bathing?: No Independently performs ADLs?: Yes (appropriate for  developmental age) Does the patient have difficulty walking or climbing stairs?: No Weakness of Legs: None Weakness of Arms/Hands: None  Home Assistive Devices/Equipment Home Assistive Devices/Equipment: None  Therapy Consults (therapy consults require a physician order) PT Evaluation Needed: No OT Evalulation Needed: No SLP Evaluation Needed: No Abuse/Neglect Assessment (Assessment to be complete while patient is alone) Physical Abuse: Denies Verbal Abuse: Denies Sexual Abuse: Denies Exploitation of patient/patient's resources: Denies Self-Neglect: Denies Values / Beliefs Cultural Requests During Hospitalization: None Spiritual Requests During Hospitalization: None Consults Spiritual Care Consult Needed: No Social Work Consult Needed: No Regulatory affairs officer (For Healthcare) Does patient have an advance directive?: No Would patient like information on creating an advanced directive?: No - patient declined information (Patient stated he was not interested)    Additional Information 1:1 In Past 12 Months?: No CIRT Risk: No Elopement Risk: No Does patient have medical clearance?: No (Labs pending)     Disposition: Case was staffed with Markus Jarvis NP who agreed patient meet criteria for an inpatient admission. Case was also staffed with [facility] Serena Croissant RN who after reviewing patient's current vitals, noticed patient continued to exhibit a high heart rate and was in active withdrawal. This Probation officer contacted [place] and informed of AC's concern's. Livia Snellen was given this writer's contact information and was informed to also contact [place] once patient's vitals returned to acceptable criteria for inpatient admission.        Disposition Initial Assessment Completed for this Encounter: Yes Disposition of Patient: Inpatient treatment program Type of inpatient treatment program: Adult (Inpatient admission once pt. is stabilized in ED per [facility])  Mamie Nick 09/02/2015 1:55 PM

## 2015-09-02 NOTE — ED Notes (Signed)
Contacted staffing for sitter  

## 2015-09-02 NOTE — Progress Notes (Signed)
Patient arrived to 2H27. Patient has no complains at this time and is no apparent distress. Suicide precautions implemented and sitter at bedside.

## 2015-09-02 NOTE — BH Assessment (Signed)
Tele Psych machine was in use by the NP assessing other patients.  Writer is now able to use the machine to assess the patient.   The nurse reports that the machine is in the room and ready for the patient to be assessed.

## 2015-09-02 NOTE — ED Provider Notes (Signed)
CSN: [id number]     Arrival date & time 09/02/15  0905 History   First MD Initiated Contact with Patient 09/02/15 [phone]     Chief Complaint  Patient presents with  . Loss of Consciousness  . Alcohol Intoxication  . Shortness of Breath     (Consider location/radiation/quality/duration/timing/severity/associated sxs/prior Treatment) HPI   60 y m who is currently living in a motel, he hasn't eaten or drank anything other than beer for the past 3 days.  He usually drinks at least 24 beers/day.  He was going to the store this morning to buy beer and cigarettes when his vision went black and he had a witnessed syncopal episode.  He has NBNB emesis for the past few days as well as diarrhea for the past few days as well.  He denies blood in his stool.    Past Medical History  Diagnosis Date  . Cirrhosis ([place])   . Degenerative joint disease   . Hypertension   . Gastric ulcer   . Depression   . Asthma   . Mitral valve prolapse 2002  . H/O hiatal hernia   . COPD (chronic obstructive pulmonary disease) ([place])   . TIA (transient ischemic attack)     2010  . Stroke [facility])     TIA - 2010 - no deficits   . Shortness of breath     with exertion   . GERD (gastroesophageal reflux disease)   . Headache(784.0)   . Hiatal hernia 1982  . Degenerative joint disease   . Anxiety   . Alcohol abuse    Past Surgical History  Procedure Laterality Date  . Gastrectomy    . Shoulder surgery Bilateral     3 surgeries on on left, 2 surgeries on right   . Rt knee arthroscopic surgery    . Back surgery      3 cervical spine surgeries C4-C5 fused  . Hernia repair    . Finger surgery Left     2nd, 3rd, & 4th fingers were cut off by table saw and reattached  . Colonoscopy N/A 01/04/2014    Procedure: COLONOSCOPY;  Surgeon: Danie Binder, MD;  [facility];  Service: Endoscopy;  Laterality: N/A;  1:45  . Esophagogastroduodenoscopy N/A 01/04/2014    Procedure: ESOPHAGOGASTRODUODENOSCOPY (EGD);   Surgeon: Danie Binder, MD;  Location[facility];  Service: Endoscopy;  Laterality: N/A;  . Incisional hernia repair N/A 01/20/2014    Procedure: LAPAROSCOPIC RECURRENT  INCISIONAL HERNIA with mesh;  Surgeon: Edward Jolly, MD;  Location: [facility] ORS;  Service: General;  Laterality: N/A;   Family History  Problem Relation Age of Onset  . Cancer Father     bone  . Cancer Brother     lungs  . Stroke Maternal Grandmother   . Colon cancer Neg Hx   . Asthma Son     died at age 28 in his sleep   . Spina bifida Son     died at age 56    Social History  Substance Use Topics  . Smoking status: Current Every Day Smoker -- 1.00 packs/day for 45 years    Types: Cigarettes    Start date: 07/30/1966  . Smokeless tobacco: Never Used  . Alcohol Use: Yes     Comment: 24 beers/day    Review of Systems  Constitutional: Negative for fever and chills.  Eyes: Negative for redness.  Respiratory: Negative for cough and shortness of breath.   Cardiovascular:  Negative for chest pain.  Gastrointestinal: Negative for nausea, vomiting, abdominal pain and diarrhea.  Genitourinary: Negative for dysuria.  Skin: Negative for rash.  Neurological: Positive for syncope. Negative for headaches.  All other systems reviewed and are negative.     Allergies  Bee venom and Penicillins  Home Medications   Prior to Admission medications   Medication Sig Start Date End Date Taking? Authorizing Provider  Aspirin-Acetaminophen-Caffeine (GOODY HEADACHE PO) Take 1 packet by mouth every 8 (eight) hours as needed (pain).   Yes Historical Provider, MD  albuterol (PROVENTIL HFA;VENTOLIN HFA) 108 (90 BASE) MCG/ACT inhaler Inhale 1-2 puffs into the lungs every 4 (four) hours as needed for wheezing. Patient not taking: Reported on 09/02/2015 06/15/15   Larene Pickett, PA-C  clindamycin (CLEOCIN) 300 MG capsule Take 1 capsule (300 mg total) by mouth 3 (three) times daily. Patient not taking: Reported on 09/02/2015  06/04/15   Derrill Center, NP  FLUoxetine (PROZAC) 20 MG capsule Take 1 capsule (20 mg total) by mouth daily. Patient not taking: Reported on 09/02/2015 06/04/15   Derrill Center, NP  gabapentin (NEURONTIN) 300 MG capsule Take 1 capsule (300 mg total) by mouth 3 (three) times daily. Patient not taking: Reported on 09/02/2015 06/04/15   Derrill Center, NP  Ipratropium-Albuterol (COMBIVENT) 20-100 MCG/ACT AERS respimat Inhale 1 puff into the lungs every 6 (six) hours as needed for wheezing. Patient not taking: Reported on 09/02/2015 06/04/15   Derrill Center, NP  metoprolol tartrate (LOPRESSOR) 25 MG tablet Take 1 tablet (25 mg total) by mouth daily before breakfast. Patient not taking: Reported on 09/02/2015 06/04/15   Derrill Center, NP  Multiple Vitamin (MULTIVITAMIN WITH MINERALS) TABS tablet Take 1 tablet by mouth daily. Patient not taking: Reported on 09/02/2015 06/04/15   Derrill Center, NP  naproxen (NAPROSYN) 500 MG tablet Take 1 tablet (500 mg total) by mouth 2 (two) times daily with a meal. Patient not taking: Reported on 09/02/2015 06/15/15   Larene Pickett, PA-C  pantoprazole (PROTONIX) 40 MG tablet Take 2 tablets (80 mg total) by mouth daily. Patient not taking: Reported on 09/02/2015 06/04/15   Derrill Center, NP  traZODone (DESYREL) 50 MG tablet Take 1 tablet (50 mg total) by mouth at bedtime and may repeat dose one time if needed. Patient not taking: Reported on 09/02/2015 06/04/15   Derrill Center, NP   BP 121/80 mmHg  Pulse 107  Temp(Src) 98.5 F (36.9 C) (Oral)  Resp 21  Ht 5\' 4"  (1.626 m)  Wt 69.1 kg  BMI 26.14 kg/m2  SpO2 99% Physical Exam  Constitutional: He is oriented to person, place, and time. No distress.  HENT:  Head: Normocephalic and atraumatic.  Eyes: EOM are normal. Pupils are equal, round, and reactive to light.  Neck: Normal range of motion. Neck supple.  Pulmonary/Chest: Effort normal. No respiratory distress.  Abdominal: Soft. There is no tenderness.  Musculoskeletal:  Normal range of motion.  Neurological: He is alert and oriented to person, place, and time.  Skin: No rash noted. He is not diaphoretic.  Psychiatric: He has a normal mood and affect.    ED Course  Procedures (including critical care time) Labs Review Labs Reviewed  CBC WITH DIFFERENTIAL/PLATELET - Abnormal; Notable for the following:    RDW 15.9 (*)    All other components within normal limits  COMPREHENSIVE METABOLIC PANEL - Abnormal; Notable for the following:    Calcium 8.6 (*)    Anion  gap 16 (*)    All other components within normal limits  TSH - Abnormal; Notable for the following:    TSH 0.347 (*)    All other components within normal limits  URINALYSIS, ROUTINE W REFLEX MICROSCOPIC (NOT AT [facility]) - Abnormal; Notable for the following:    Ketones, ur 15 (*)    All other components within normal limits  ETHANOL - Abnormal; Notable for the following:    Alcohol, Ethyl (B) 36 (*)    All other components within normal limits  MAGNESIUM - Abnormal; Notable for the following:    Magnesium 1.5 (*)    All other components within normal limits  MRSA PCR SCREENING  URINE CULTURE  T4, FREE  URINE RAPID DRUG SCREEN, HOSP PERFORMED  PHOSPHORUS  T4, FREE  T3  COMPREHENSIVE METABOLIC PANEL  CBC  PROTIME-INR  APTT  CBG MONITORING, ED  Randolm Idol, ED    Imaging Review Dg Chest 2 View  09/02/2015  CLINICAL DATA:  Tachycardia.  COPD. EXAM: CHEST  2 VIEW COMPARISON:  Chest radiograph 06/15/2015. FINDINGS: Multiple monitoring leads overlie the patient. Stable cardiac and mediastinal contours. No consolidative pulmonary opacities. No pleural effusion or pneumothorax. Thoracic spine is unremarkable. IMPRESSION: No acute cardiopulmonary process. Electronically Signed   By: Lovey Newcomer M.D.   On: 09/02/2015 17:49   I have personally reviewed and evaluated these images and lab results as part of my medical decision-making.   EKG Interpretation   Date/Time:  Friday September 02 2015 09:10:32 EST Ventricular Rate:  120 PR Interval:  123 QRS Duration: 105 QT Interval:  328 QTC Calculation: 463 R Axis:   81 Text Interpretation:  Sinus tachycardia `no acute st/t changes from prior,  rate is faster Confirmed by Ashok Cordia  MD, Lennette Bihari ([id number]) on 09/02/2015 9:33:25  AM      MDM   Final diagnoses:  Tachycardia  Alcohol withdrawal, with unspecified complication ([place])     62 y m who is currently living in a motel, he hasn't eaten or drank anything other than beer for the past 3 days.  He usually drinks at least 24 beers/day. He presents with syncope  Exam as above.  Tachycardic, satting well, afebrile, good bp.  Concern for dehydration vs. etoh withdrawal.  Pt mentioning passive SI on exam and previous hx of attempt.  Will consult psych  Will obtian cbc/bmp/trop/ekg.  Will give 2L NS and place on CIWA  Cbc/bmp/trop unremarkable.  ekg w/ sinus.  Have given ativan in the ED. Have given 2L NS, patient continues to be tachycardic but satting well, doubt PE given no sig cp or sob and feel etoh withdrawal more likely  Will admit for etoh withdrawal. Psych has evaled and feel pt is good candidate for inpatient psych once stabilized.   Jarome Matin, MD 09/02/15 [place], MD 09/03/15 [phone]

## 2015-09-02 NOTE — H&P (Signed)
[facility] History and Physical                                                                                    Samuel Reyes, is a 60 y.o. male  MRN: [id number]   DOB - [birth year]  Admit Date - 09/02/2015  Outpatient Primary MD for the patient is Lorayne Marek, MD  Referring MD: Ashok Cordia / ER  PMH: Past Medical History  Diagnosis Date  . Cirrhosis ([place])   . Degenerative joint disease   . Hypertension   . Gastric ulcer   . Depression   . Asthma   . Mitral valve prolapse 2002  . H/O hiatal hernia   . COPD (chronic obstructive pulmonary disease) ([place])   . TIA (transient ischemic attack)     2010  . Stroke [facility])     TIA - 2010 - no deficits   . Shortness of breath     with exertion   . GERD (gastroesophageal reflux disease)   . Headache(784.0)   . Hiatal hernia 1982  . Degenerative joint disease   . Anxiety   . Alcohol abuse       PSH: Past Surgical History  Procedure Laterality Date  . Gastrectomy    . Shoulder surgery Bilateral     3 surgeries on on left, 2 surgeries on right   . Rt knee arthroscopic surgery    . Back surgery      3 cervical spine surgeries C4-C5 fused  . Hernia repair    . Finger surgery Left     2nd, 3rd, & 4th fingers were cut off by table saw and reattached  . Colonoscopy N/A 01/04/2014    Procedure: COLONOSCOPY;  Surgeon: Danie Binder, MD;  Location: [facility]O [facility]ITE;  Service: Endoscopy;  Laterality: N/A;  1:45  . Esophagogastroduodenoscopy N/A 01/04/2014    Procedure: ESOPHAGOGASTRODUODENOSCOPY (EGD);  Surgeon: Danie Binder, MD;  Location: [facility]O [facility];  Service: Endoscopy;  Laterality: N/A;  . Incisional hernia repair N/A 01/20/2014    Procedure: LAPAROSCOPIC RECURRENT  INCISIONAL HERNIA with mesh;  Surgeon: Edward Jolly, MD;  Location: [facility] ORS;  Service: General;  Laterality: N/A;     CC:  Chief Complaint  Patient presents with  . Loss of Consciousness  . Alcohol Intoxication  . Shortness of Breath     HPI:    60 year old male patient with a 40 year history of daily alcohol use, worse over the past 3 years since his wife died. He is disabled due to chronic back and knee pain after having undergone multiple surgeries, he also has history of hypertension and apparent bleeding ulcers. He reports that his drinking began after one of his sons died of complications related to spina bifida; 6 years ago another son (age 47) died of complications from asthma. Since then he's been estranged from his daughter. Patient reports he typically drinks at least a case of beer per day and utilizes goody powders daily. Fortunately he has not had any current reflux symptoms or dark or bloody stools. He is currently homeless although has been able to obtain overnight lodging in a local motel. He has  not had anything to eat or drink for the past 3 days other than beer. He apparently was at a local gas station convenience store attempting to buy beer and cigarettes when he had loss of vision and was witnessed to be "passing out" and was caught by another customer in the store. Also been vomiting for the past several days as well as having diarrhea. Tells me he is supposed to be taking inhalers for his COPD and blood pressure medications but does not take these medications and instead spends his money on alcohol and tobacco.  ER Evaluation and treatment: Afebrile, hypertensive with initial blood pressure 147/95 with current blood pressure 165/90, tachycardic heart rate initially was 115 bpm and is now averaging 135 bpm, room air saturations 97-100%. EKG: Sinus tachycardia with ventricular rate 120 bpm, QTC 463 ms, normal R-wave progression, no significant ST segment changes that would be concerning for ischemia. 2 View CXR: Ordered by ER but pending Laboratory data: K+ 4.8, BUN 9, creatinine 0.99, calcium 8.6, anion gap 16 with glucose 74, troponin 0.00, white count 8800 with hemoglobin 15.4 and platelets 367,000, TSH 0.347 Normal saline  IV bolus total of 3000 mL Ativan 1 mg 1 dose with repeat 2 mg 1 dose Valium 5 mg IV 1 dose Vitamin B 100 mg IV 1 dose  Review of Systems   In addition to the HPI above,  No Fever-chills, myalgias or other constitutional symptoms No Headache, changes with Vision or hearing, new weakness, tingling, numbness in any extremity, No problems swallowing food or Liquids, indigestion/reflux No Chest pain, Cough or Shortness of Breath, palpitations, orthopnea or DOE No Abdominal pain, N/V; no melena or hematochezia, no dark tarry stools, Bowel movements are regular, No dysuria, hematuria or flank pain No new skin rashes, lesions, masses or bruises, No new joints pains-aches No polyuria, polydypsia or polyphagia,  *A full 10 point Review of Systems was done, except as stated above, all other Review of Systems were negative.  Social History Social History  Substance Use Topics  . Smoking status: Current Every Day Smoker -- 1.00 packs/day for 45 years    Types: Cigarettes    Start date: 07/30/1966  . Smokeless tobacco: Never Used  . Alcohol Use: Yes     Comment: 24 beers/day    Resides at: Motel but primarily homeless  Lives with: Alone  Ambulatory status: Without assistive devices   Family History Family History  Problem Relation Age of Onset  . Cancer Father     bone  . Cancer Brother     lungs  . Stroke Maternal Grandmother   . Colon cancer Neg Hx   . Asthma Son     died at age 12 in his sleep   . Spina bifida Son     died at age 49      Prior to Admission medications   Medication Sig Start Date End Date Taking? Authorizing Provider  Aspirin-Acetaminophen-Caffeine (GOODY HEADACHE PO) Take 1 packet by mouth every 8 (eight) hours as needed (pain).   Yes Historical Provider, MD  albuterol (PROVENTIL HFA;VENTOLIN HFA) 108 (90 BASE) MCG/ACT inhaler Inhale 1-2 puffs into the lungs every 4 (four) hours as needed for wheezing. Patient not taking: Reported on 09/02/2015 06/15/15    Larene Pickett, PA-C  clindamycin (CLEOCIN) 300 MG capsule Take 1 capsule (300 mg total) by mouth 3 (three) times daily. Patient not taking: Reported on 09/02/2015 06/04/15   Derrill Center, NP  FLUoxetine (PROZAC) 20 MG capsule  Take 1 capsule (20 mg total) by mouth daily. Patient not taking: Reported on 09/02/2015 06/04/15   Derrill Center, NP  gabapentin (NEURONTIN) 300 MG capsule Take 1 capsule (300 mg total) by mouth 3 (three) times daily. Patient not taking: Reported on 09/02/2015 06/04/15   Derrill Center, NP  Ipratropium-Albuterol (COMBIVENT) 20-100 MCG/ACT AERS respimat Inhale 1 puff into the lungs every 6 (six) hours as needed for wheezing. Patient not taking: Reported on 09/02/2015 06/04/15   Derrill Center, NP  metoprolol tartrate (LOPRESSOR) 25 MG tablet Take 1 tablet (25 mg total) by mouth daily before breakfast. Patient not taking: Reported on 09/02/2015 06/04/15   Derrill Center, NP  Multiple Vitamin (MULTIVITAMIN WITH MINERALS) TABS tablet Take 1 tablet by mouth daily. Patient not taking: Reported on 09/02/2015 06/04/15   Derrill Center, NP  naproxen (NAPROSYN) 500 MG tablet Take 1 tablet (500 mg total) by mouth 2 (two) times daily with a meal. Patient not taking: Reported on 09/02/2015 06/15/15   Larene Pickett, PA-C  pantoprazole (PROTONIX) 40 MG tablet Take 2 tablets (80 mg total) by mouth daily. Patient not taking: Reported on 09/02/2015 06/04/15   Derrill Center, NP  traZODone (DESYREL) 50 MG tablet Take 1 tablet (50 mg total) by mouth at bedtime and may repeat dose one time if needed. Patient not taking: Reported on 09/02/2015 06/04/15   Derrill Center, NP    Allergies  Allergen Reactions  . Bee Venom Anaphylaxis  . Penicillins Rash    Has patient had a PCN reaction causing immediate rash, facial/tongue/throat swelling, SOB or lightheadedness with hypotension: {Yes Has patient had a PCN reaction causing severe rash involving mucus membranes or skin necrosis: NO Has patient had a PCN reaction  that required hospitalization {Yes Has patient had a PCN reaction occurring within the last 10 years: NO If all of the above answers are "NO", then may proceed with Cephalosporin use.     Physical Exam  Vitals  Blood pressure 165/90, pulse 125, temperature 98.1 F (36.7 C), temperature source Oral, resp. rate 13, height 5\' 5"  (1.651 m), weight 150 lb (68.04 kg), SpO2 97 %.   General:  Flushed and mildly anxious and depressed appears to be evolving into full alcohol withdrawal  Psych: Depressed and slightly anxious affect, Awake Alert, Oriented X 3. Speech and thought patterns are clear and appropriate, no apparent short term memory deficits  Neuro:   No focal neurological deficits, CN II through XII intact, Strength 5/5 all 4 extremities, Sensation intact all 4 extremities. Non-tremulous  ENT:  Ears and Eyes appear Normal, Conjunctivae clear, PER. Moist oral mucosa without erythema or exudates.  Neck:  Supple, No lymphadenopathy appreciated  Respiratory:  Symmetrical chest wall movement, Good air movement bilaterally, CTAB. Room Air  Cardiac:  RRR, No Murmurs, no LE edema noted, no JVD, No carotid bruits, peripheral pulses palpable at 2+; hypertensive and tachycardic  Abdomen:  Positive bowel sounds, Soft, tender upper quadrant without guarding or rebounding-no left upper quadrant pain with palpation, Non distended,  No masses appreciated, no obvious hepatosplenomegaly  Skin:  No Cyanosis, Normal Skin Turgor, No Skin Rash or Bruise. Flushed chest neck and face  Extremities: Symmetrical without obvious trauma or injury,  no effusions.  Data Review  CBC  Recent Labs Lab 09/02/15 0918  WBC 8.8  HGB 15.4  HCT 45.1  PLT 367  MCV 91.1  MCH 31.1  MCHC 34.1  RDW 15.9*  LYMPHSABS 1.8  MONOABS  0.7  EOSABS 0.0  BASOSABS 0.1    Chemistries   Recent Labs Lab 09/02/15 0918  NA 141  K 4.8  CL 103  CO2 22  GLUCOSE 99  BUN 9  CREATININE 0.99  CALCIUM 8.6*  AST 35    ALT 19  ALKPHOS 87  BILITOT 1.0    estimated creatinine clearance is 69 mL/min (by C-G formula based on Cr of 0.99).   Recent Labs  09/02/15 1427  TSH 0.347*    Coagulation profile No results for input(s): INR, PROTIME in the last 168 hours.  No results for input(s): DDIMER in the last 72 hours.  Cardiac Enzymes No results for input(s): CKMB, TROPONINI, MYOGLOBIN in the last 168 hours.  Invalid input(s): CK  Invalid input(s): POCBNP  Urinalysis    Component Value Date/Time   COLORURINE YELLOW 05/31/2015 2156   APPEARANCEUR CLEAR 05/31/2015 2156   LABSPEC 1.005 05/31/2015 2156   PHURINE 6.0 05/31/2015 2156   GLUCOSEU NEGATIVE 05/31/2015 2156   HGBUR NEGATIVE 05/31/2015 2156   [place] NEGATIVE 05/31/2015 2156   [place] NEGATIVE 05/31/2015 2156   PROTEINUR NEGATIVE 05/31/2015 2156   UROBILINOGEN 0.2 05/31/2015 2156   NITRITE NEGATIVE 05/31/2015 2156   LEUKOCYTESUR NEGATIVE 05/31/2015 2156    Imaging results:   No results found.   EKG: (Independently reviewed)  Sinus tachycardia with ventricular rate 120 bpm, QTC 463 ms, normal R-wave progression, no significant ST segment changes that would be concerning for ischemia.   Assessment & Plan  Principal Problem:   Alcohol withdrawal/ Alcohol dependence  -Last drink 3 AM and since drinks quite heavily (min. 24 beers daily) clearly appears to be headed into severe alcohol withdrawal -Admit to SDU/Inpt -Step down level Ativan CIWA -Monitor for worsening symptomatology that may warrant transfer to ICU for Precedex therapy -Patient is high flight risk; I discussed with the patient that his withdrawal symptoms would likely become severe and that he may require soft wrist restraints for his own protection until he is medically stable-at this time patient is verbalizing understanding of this process -Sitter at bedside -Has had frequent hospitalizations at behavioral health for depressive disorder symptomatology and  was through alcohol withdrawal will need psychiatric evaluation -Discussed with the patient that he must demonstrate willingness to quit permanently and never drink again; acknowledged to him that this would be very tough since he has been drinking daily for more than 40 years  Active Problems:   Dehydration, severe -Secondary to daily alcohol use -Continue IV fluids -Monitor for recurrent orthostasis; suspect this is etiology to patient's near syncopal episode at the gas station    Hypertensive urgency -Multifactorial secondary to untreated hypertension and person not taking medications as well as acute alcohol withdrawal -Have started clonidine 0.2 mg by mouth 3 times a day -prn IV hydralazine -Interestingly EKG without voltage criteria for LVH and echocardiogram in August was completely normal    COPD  -Currently not wheezing -If requires bronchodilators in setting of tachycardia from alcohol withdrawal would utilize Xopenex as opposed to [employer]    Major depression, recurrent  -As above regarding likely need for psychiatric consultation once alcohol withdrawal complete    Smoker -Nicotine patch    Protein-calorie malnutrition, severe  -Secondary to ongoing alcoholism and poor oral intake as well as homelessness -[place] alcohol or gastroenterology need formal nutrition consultation    Hepatic steatosis -Secondary to alcoholism -ET abdomen and pelvis in August 2016 shows normal spleen with the hepatic steatosis but no evidence of cirrhosis -  Current exam consistent with acute alcoholic hepatitis given abdominal pain on palpation    Abnormal TSH -Slightly low TSH so we'll check free T4 and T3 -Likely sick euthyroid syndrome    DVT Prophylaxis: Lovenox  Family Communication:  No family at bedside   Code Status:  Full code  Condition:  Guarded  Discharge disposition: Anticipate likely discharge to an patient behavioral health after successful completion of alcohol  detoxification while inpatient and once medically stable  Time spent in minutes : 60      ELLIS,[name] L. ANP on 09/02/2015 at 4:25 PM  You may contact me by going to [email or web address] - password [email or web address]  I am available from 7a-7p but please confirm I am on the schedule by going to Amion as above.   After 7p please contact night coverage person covering me after hours  [facility]

## 2015-09-03 DIAGNOSIS — F10230 Alcohol dependence with withdrawal, uncomplicated: Secondary | ICD-10-CM

## 2015-09-03 DIAGNOSIS — J449 Chronic obstructive pulmonary disease, unspecified: Secondary | ICD-10-CM

## 2015-09-03 DIAGNOSIS — F10239 Alcohol dependence with withdrawal, unspecified: Secondary | ICD-10-CM

## 2015-09-03 DIAGNOSIS — I160 Hypertensive urgency: Principal | ICD-10-CM

## 2015-09-03 DIAGNOSIS — F332 Major depressive disorder, recurrent severe without psychotic features: Secondary | ICD-10-CM

## 2015-09-03 DIAGNOSIS — F1020 Alcohol dependence, uncomplicated: Secondary | ICD-10-CM

## 2015-09-03 LAB — COMPREHENSIVE METABOLIC PANEL
ALBUMIN: 2.4 g/dL — AB (ref 3.5–5.0)
ALT: 12 U/L — AB (ref 17–63)
AST: 19 U/L (ref 15–41)
Alkaline Phosphatase: 65 U/L (ref 38–126)
Anion gap: 10 (ref 5–15)
BILIRUBIN TOTAL: 0.8 mg/dL (ref 0.3–1.2)
BUN: 9 mg/dL (ref 6–20)
CO2: 21 mmol/L — ABNORMAL LOW (ref 22–32)
CREATININE: 0.81 mg/dL (ref 0.61–1.24)
Calcium: 7.7 mg/dL — ABNORMAL LOW (ref 8.9–10.3)
Chloride: 106 mmol/L (ref 101–111)
GFR calc Af Amer: >60 mL/min (ref >60)
GLUCOSE: 91 mg/dL (ref 65–99)
POTASSIUM: 3.7 mmol/L (ref 3.5–5.1)
Sodium: 137 mmol/L (ref 135–145)
TOTAL PROTEIN: 4.9 g/dL — AB (ref 6.5–8.1)

## 2015-09-03 LAB — CBC
HCT: 34.4 % — ABNORMAL LOW (ref 39.0–52.0)
HEMOGLOBIN: 11.3 g/dL — AB (ref 13.0–17.0)
MCH: 29.8 pg (ref 26.0–34.0)
MCHC: 32.8 g/dL (ref 30.0–36.0)
MCV: 90.8 fL (ref 78.0–100.0)
Platelets: 276 10*3/uL (ref 150–400)
RBC: 3.79 MIL/uL — ABNORMAL LOW (ref 4.22–5.81)
RDW: 15.8 % — ABNORMAL HIGH (ref 11.5–15.5)
WBC: 5.8 10*3/uL (ref 4.0–10.5)

## 2015-09-03 LAB — PROTIME-INR
INR: 1.02 (ref 0.00–1.49)
PROTHROMBIN TIME: 13.6 s (ref 11.6–15.2)

## 2015-09-03 LAB — APTT: aPTT: 32 seconds (ref 24–37)

## 2015-09-03 MED ORDER — TRAZODONE HCL 100 MG PO TABS
100.0000 mg | ORAL_TABLET | Freq: Every day | ORAL | Status: DC
  Administered 2015-09-03 – 2015-09-06 (×4): 100 mg via ORAL
  Filled 2015-09-03 (×2): qty 2
  Filled 2015-09-03: qty 1
  Filled 2015-09-03: qty 2

## 2015-09-03 MED ORDER — TRAZODONE HCL 50 MG PO TABS: 50.0000 mg | ORAL_TABLET | Freq: Every evening | ORAL | Status: DC | PRN

## 2015-09-03 MED ORDER — PANTOPRAZOLE SODIUM 40 MG PO TBEC
80.0000 mg | DELAYED_RELEASE_TABLET | Freq: Every day | ORAL | Status: DC
  Administered 2015-09-03 – 2015-09-07 (×5): 80 mg via ORAL
  Filled 2015-09-03 (×6): qty 2

## 2015-09-03 MED ORDER — GABAPENTIN 100 MG PO CAPS
100.0000 mg | ORAL_CAPSULE | Freq: Three times a day (TID) | ORAL | Status: DC
  Administered 2015-09-03 – 2015-09-07 (×12): 100 mg via ORAL
  Filled 2015-09-03 (×12): qty 1

## 2015-09-03 MED ORDER — ADULT MULTIVITAMIN W/MINERALS CH
1.0000 | ORAL_TABLET | Freq: Every day | ORAL | Status: DC
  Administered 2015-09-03 – 2015-09-07 (×5): 1 via ORAL
  Filled 2015-09-03 (×5): qty 1

## 2015-09-03 MED ORDER — ENSURE ENLIVE PO LIQD
237.0000 mL | Freq: Three times a day (TID) | ORAL | Status: DC
  Administered 2015-09-03 – 2015-09-07 (×10): 237 mL via ORAL

## 2015-09-03 MED ORDER — ALBUTEROL SULFATE (2.5 MG/3ML) 0.083% IN NEBU: 2.5000 mg | INHALATION_SOLUTION | RESPIRATORY_TRACT | Status: DC | PRN

## 2015-09-03 MED ORDER — METOPROLOL TARTRATE 25 MG PO TABS
25.0000 mg | ORAL_TABLET | Freq: Every day | ORAL | Status: DC
  Administered 2015-09-03 – 2015-09-07 (×5): 25 mg via ORAL
  Filled 2015-09-03 (×5): qty 1

## 2015-09-03 MED ORDER — FLUOXETINE HCL 20 MG PO CAPS
20.0000 mg | ORAL_CAPSULE | Freq: Every day | ORAL | Status: DC
  Administered 2015-09-03 – 2015-09-07 (×5): 20 mg via ORAL
  Filled 2015-09-03 (×5): qty 1

## 2015-09-03 MED ORDER — FOLIC ACID 1 MG PO TABS
1.0000 mg | ORAL_TABLET | Freq: Every day | ORAL | Status: DC
  Administered 2015-09-03 – 2015-09-07 (×5): 1 mg via ORAL
  Filled 2015-09-03 (×5): qty 1

## 2015-09-03 NOTE — Consult Note (Signed)
[facility] Face-to-Face Psychiatry Consult   Reason for Consult: alcohol withdrawal, depression Referring Physician:  Dr. Gloriann Loan Patient Identification: Samuel Reyes MRN:  [id number] Principal Diagnosis: Alcohol dependence with uncomplicated withdrawal [facility]) Diagnosis:   Patient Active Problem List   Diagnosis Date Noted  . Alcohol withdrawal syndrome (HCC) [F10.239] 09/02/2015    Priority: High  . Major depressive disorder, recurrent, severe without psychotic features (HCC) [F33.2]     Priority: High  . Alcohol dependence with uncomplicated withdrawal (HCC) [F10.230]     Priority: High  . Dehydration, severe [E86.0] 09/02/2015  . Alcohol withdrawal (HCC) [F10.239] 09/02/2015  . Abnormal TSH [R79.89] 09/02/2015  . Hypochloremia [E87.8]   . Hepatic steatosis [K76.0] 02/05/2015  . Pressure ulcer [L89.90] 02/05/2015  . Protein-calorie malnutrition, severe (HCC) [E43] 01/19/2015  . Seizures (HCC) [R56.9] 01/19/2015  . GERD without esophagitis [K21.9] 01/05/2015  . Suicidal ideations [R45.851] 07/21/2014  . Drug overdose [T50.901A] 07/21/2014  . Alcohol dependence (HCC) [F10.20] 03/09/2014  . Major depression, recurrent (HCC) [F33.9] 03/09/2014  . PTSD (post-traumatic stress disorder) [F43.10] 03/09/2014  . Recurrent ventral incisional hernia [K43.2] 01/20/2014  . Incisional hernia, without obstruction or gangrene [K43.2] 11/26/2013  . Colon polyps [K63.5] 11/26/2013  . DJD (degenerative joint disease) [M19.90] 10/28/2013  . Smoking [Z72.0] 10/28/2013  . Knee pain [M25.569] 06/15/2013  . Hypertensive urgency [I16.0] 02/17/2013  . FH: lung cancer [Z80.1] 02/17/2013  . COPD (chronic obstructive pulmonary disease) (HCC) [J44.9] 02/17/2013  . Smoker [Z72.0] 02/17/2013  . GERD (gastroesophageal reflux disease) [K21.9] 02/17/2013  . Hiatal hernia [K44.9] 02/17/2013  . Bereavement [Z63.4] 02/02/2013    Total Time spent with patient: 1 hour  Subjective:   Samuel Reyes is a 60 y.o.  male patient admitted with alcohol withdrawal and depression.  HPI:  Thank you for asking me to do a psychiatric consult on Samuel Reyes, a 60 y.o. Male, widower, with long history of  Alcohol use disorder and depression who presents to the hospital with worsening depressive symptoms, passive suicidal thoughts with no specific plan but says he has no desire to continue living. Patient reports that he started drinking 40 years ago after the death of his son, his alcohol used escalated after his wife died few years ago. Prior to this admission, he states that he was so depressed and he tried to drink himself to death. He has been drinking everyday since his wife passed away three years ago, reporting drinking up to one case of 24-12oz beers daily . Patient did report one prior suicide attempt a week after his wife's death three years ago, where patient reported he sliced both wrists open receiving medical attention. Patient has limited supports and reported both of his sons are also deceased with one living daughter that he is not in contact with for the past 6 years. Patient stated he received services after his wife's death from [facility] where he saw Dr. Jean Rosenthal who prescribed medication Prozac which helps his depressive symptoms whenever he takes it. However, he has been drinking more, feeling more depressed, hopeless, since he became homeless. He reports history of DTs and  Blackouts due to alcohol withdrawal and intoxication. He received alcohol rehablitation for one month at [facility] three months ago, he was sober for 2 months before he started drinking again. Today, he denies suicidal thoughts, psychosis or delusional thinking. However, he stressed out and overwhelmed for being homeless.  Past Psychiatric History: Alcohol use disorder, MDD  Risk to Self: Suicidal Ideation: Yes-Currently Present (Passive but pt  admitts he could not contract for safety ) Suicidal Intent: No-Not Currently/Within Last 6  Months (pt. did state when asked about SI "he would figure it out") Is patient at risk for suicide?: Yes Suicidal Plan?: No-Not Currently/Within Last 6 Months Access to Means: No What has been your use of drugs/alcohol within the last 12 months?: daily use How many times?: 1 Other Self Harm Risks: Excessive ETOH use. Triggers for Past Attempts: Other (Comment) (Loss of wife three years ago) Intentional Self Injurious Behavior: Cutting (Pt. admitted to self harm three years ago) Comment - Self Injurious Behavior: Patient cut his wrist after death of wife Risk to Others: Homicidal Ideation: No Thoughts of Harm to Others: No Current Homicidal Intent: No Current Homicidal Plan: No Access to Homicidal Means: No Identified Victim: na History of harm to others?: No Assessment of Violence: None Noted Violent Behavior Description: na Does patient have access to weapons?: No (Patient has weapons at his mother's home) Criminal Charges Pending?: No Does patient have a court date: No Prior Inpatient Therapy: Prior Inpatient Therapy: No Prior Therapy Dates: na Prior Therapy Facilty/Provider(s): [facility] Reason for Treatment: SA use, depression Prior Outpatient Therapy: Prior Outpatient Therapy: Yes Prior Therapy Dates: Last three months [facility] Prior Therapy Facilty/Provider(s): [facility] for two years Reason for Treatment: Sa use, depression Does patient have an ACCT team?: No Does patient have Intensive In-House Services?  : No Does patient have [facility] services? : No Does patient have P4CC services?: No  Past Medical History:  Past Medical History  Diagnosis Date  . Cirrhosis ([place])   . Degenerative joint disease   . Hypertension   . Gastric ulcer   . Depression   . Asthma   . Mitral valve prolapse 2002  . H/O hiatal hernia   . COPD (chronic obstructive pulmonary disease) ([place])   . TIA (transient ischemic attack)     2010  . Stroke [facility])     TIA - 2010 - no deficits   . Shortness  of breath     with exertion   . GERD (gastroesophageal reflux disease)   . Headache(784.0)   . Hiatal hernia 1982  . Degenerative joint disease   . Anxiety   . Alcohol abuse     Past Surgical History  Procedure Laterality Date  . Gastrectomy    . Shoulder surgery Bilateral     3 surgeries on on left, 2 surgeries on right   . Rt knee arthroscopic surgery    . Back surgery      3 cervical spine surgeries C4-C5 fused  . Hernia repair    . Finger surgery Left     2nd, 3rd, & 4th fingers were cut off by table saw and reattached  . Colonoscopy N/A 01/04/2014    Procedure: COLONOSCOPY;  Surgeon: Danie Binder, MD;  Location: [facility] ENDO SUITE;  Service: Endoscopy;  Laterality: N/A;  1:45  . Esophagogastroduodenoscopy N/A 01/04/2014    Procedure: ESOPHAGOGASTRODUODENOSCOPY (EGD);  Surgeon: Danie Binder, MD;  Location: [facility] ENDO SUITE;  Service: Endoscopy;  Laterality: N/A;  . Incisional hernia repair N/A 01/20/2014    Procedure: LAPAROSCOPIC RECURRENT  INCISIONAL HERNIA with mesh;  Surgeon: Edward Jolly, MD;  Location: [facility] ORS;  Service: General;  Laterality: N/A;   Family History:  Family History  Problem Relation Age of Onset  . Cancer Father     bone  . Cancer Brother     lungs  . Stroke Maternal Grandmother   . Colon  cancer Neg Hx   . Asthma Son     died at age 73 in his sleep   . Spina bifida Son     died at age 40    Family Psychiatric  History:  Social History:  History  Alcohol Use  . Yes    Comment: 24 beers/day     History  Drug Use No    Comment: denid using any drugs    Social History   Social History  . Marital Status: Widowed    Spouse Name: N/A  . Number of Children: N/A  . Years of Education: N/A   Occupational History  . Disability    Social History Main Topics  . Smoking status: Current Every Day Smoker -- 1.00 packs/day for 45 years    Types: Cigarettes    Start date: 07/30/1966  . Smokeless tobacco: Never Used  . Alcohol Use: Yes      Comment: 24 beers/day  . Drug Use: No     Comment: denid using any drugs  . Sexual Activity: Not Asked   Other Topics Concern  . None   Social History Narrative   Additional Social History:    Allergies:   Allergies  Allergen Reactions  . Bee Venom Anaphylaxis  . Penicillins Rash    Has patient had a PCN reaction causing immediate rash, facial/tongue/throat swelling, SOB or lightheadedness with hypotension: {Yes Has patient had a PCN reaction causing severe rash involving mucus membranes or skin necrosis: NO Has patient had a PCN reaction that required hospitalization {Yes Has patient had a PCN reaction occurring within the last 10 years: NO If all of the above answers are "NO", then may proceed with Cephalosporin use.     Labs:  Results for orders placed or performed during the hospital encounter of 09/02/15 (from the past 48 hour(s))  CBC with Differential     Status: Abnormal   Collection Time: 09/02/15  9:18 AM  Result Value Ref Range   WBC 8.8 4.0 - 10.5 K/uL   RBC 4.95 4.22 - 5.81 MIL/uL   Hemoglobin 15.4 13.0 - 17.0 g/dL   HCT 45.1 39.0 - 52.0 %   MCV 91.1 78.0 - 100.0 fL   MCH 31.1 26.0 - 34.0 pg   MCHC 34.1 30.0 - 36.0 g/dL   RDW 15.9 (H) 11.5 - 15.5 %   Platelets 367 150 - 400 K/uL   Neutrophils Relative % 71 %   Neutro Abs 6.2 1.7 - 7.7 K/uL   Lymphocytes Relative 20 %   Lymphs Abs 1.8 0.7 - 4.0 K/uL   Monocytes Relative 8 %   Monocytes Absolute 0.7 0.1 - 1.0 K/uL   Eosinophils Relative 0 %   Eosinophils Absolute 0.0 0.0 - 0.7 K/uL   Basophils Relative 1 %   Basophils Absolute 0.1 0.0 - 0.1 K/uL  Comprehensive metabolic panel     Status: Abnormal   Collection Time: 09/02/15  9:18 AM  Result Value Ref Range   Sodium 141 135 - 145 mmol/L   Potassium 4.8 3.5 - 5.1 mmol/L    Comment: HEMOLYSIS AT THIS LEVEL MAY AFFECT RESULT   Chloride 103 101 - 111 mmol/L   CO2 22 22 - 32 mmol/L   Glucose, Bld 99 65 - 99 mg/dL   BUN 9 6 - 20 mg/dL   Creatinine, Ser  0.99 0.61 - 1.24 mg/dL   Calcium 8.6 (L) 8.9 - 10.3 mg/dL   Total Protein 6.8 6.5 - 8.1 g/dL  Albumin 3.5 3.5 - 5.0 g/dL   AST 35 15 - 41 U/L   ALT 19 17 - 63 U/L   Alkaline Phosphatase 87 38 - 126 U/L   Total Bilirubin 1.0 0.3 - 1.2 mg/dL   GFR calc non Af Amer >60 >60 mL/min   GFR calc Af Amer >60 >60 mL/min    Comment: (NOTE) The eGFR has been calculated using the CKD EPI equation. This calculation has not been validated in all clinical situations. eGFR's persistently <60 mL/min signify possible Chronic Kidney Disease.    Anion gap 16 (H) 5 - 15  POC CBG, ED     Status: None   Collection Time: 09/02/15  9:31 AM  Result Value Ref Range   Glucose-Capillary 74 65 - 99 mg/dL  TSH     Status: Abnormal   Collection Time: 09/02/15  2:27 PM  Result Value Ref Range   TSH 0.347 (L) 0.350 - 4.500 uIU/mL  T4, free     Status: None   Collection Time: 09/02/15  2:41 PM  Result Value Ref Range   Free T4 0.68 0.61 - 1.12 ng/dL  I-Stat Troponin, ED (not at [facility])     Status: None   Collection Time: 09/02/15  2:41 PM  Result Value Ref Range   Troponin i, poc 0.00 0.00 - 0.08 ng/mL   Comment 3            Comment: Due to the release kinetics of cTnI, a negative result within the first hours of the onset of symptoms does not rule out myocardial infarction with certainty. If myocardial infarction is still suspected, repeat the test at appropriate intervals.   Ethanol     Status: Abnormal   Collection Time: 09/02/15  3:21 PM  Result Value Ref Range   Alcohol, Ethyl (B) 36 (H) <5 mg/dL    Comment:        LOWEST DETECTABLE LIMIT FOR SERUM ALCOHOL IS 5 mg/dL FOR MEDICAL PURPOSES ONLY   Magnesium     Status: Abnormal   Collection Time: 09/02/15  3:21 PM  Result Value Ref Range   Magnesium 1.5 (L) 1.7 - 2.4 mg/dL  Phosphorus     Status: None   Collection Time: 09/02/15  3:21 PM  Result Value Ref Range   Phosphorus 3.4 2.5 - 4.6 mg/dL  T4, free     Status: None   Collection Time:  09/02/15  5:01 PM  Result Value Ref Range   Free T4 0.68 0.61 - 1.12 ng/dL  MRSA PCR Screening     Status: None   Collection Time: 09/02/15  5:02 PM  Result Value Ref Range   MRSA by PCR NEGATIVE NEGATIVE    Comment:        The GeneXpert MRSA Assay (FDA approved for NASAL specimens only), is one component of a comprehensive MRSA colonization surveillance program. It is not intended to diagnose MRSA infection nor to guide or monitor treatment for MRSA infections.   Urine culture     Status: None (Preliminary result)   Collection Time: 09/02/15  6:05 PM  Result Value Ref Range   Specimen Description URINE, RANDOM    Special Requests NONE    Culture TOO YOUNG TO READ    Report Status PENDING   Urinalysis, Routine w reflex microscopic (not at [facility])     Status: Abnormal   Collection Time: 09/02/15  6:06 PM  Result Value Ref Range   Color, Urine YELLOW YELLOW   APPearance  CLEAR CLEAR   Specific Gravity, Urine 1.018 1.005 - 1.030   pH 5.5 5.0 - 8.0   Glucose, UA NEGATIVE NEGATIVE mg/dL   Hgb urine dipstick NEGATIVE NEGATIVE   Bilirubin Urine NEGATIVE NEGATIVE   Ketones, ur 15 (A) NEGATIVE mg/dL   Protein, ur NEGATIVE NEGATIVE mg/dL   Nitrite NEGATIVE NEGATIVE   Leukocytes, UA NEGATIVE NEGATIVE    Comment: MICROSCOPIC NOT DONE ON URINES WITH NEGATIVE PROTEIN, BLOOD, LEUKOCYTES, NITRITE, OR GLUCOSE <1000 mg/dL.  Urine rapid drug screen (hosp performed)     Status: None   Collection Time: 09/02/15  6:06 PM  Result Value Ref Range   Opiates NONE DETECTED NONE DETECTED   Cocaine NONE DETECTED NONE DETECTED   Benzodiazepines NONE DETECTED NONE DETECTED   Amphetamines NONE DETECTED NONE DETECTED   Tetrahydrocannabinol NONE DETECTED NONE DETECTED   Barbiturates NONE DETECTED NONE DETECTED    Comment:        DRUG SCREEN FOR MEDICAL PURPOSES ONLY.  IF CONFIRMATION IS NEEDED FOR ANY PURPOSE, NOTIFY LAB WITHIN 5 DAYS.        LOWEST DETECTABLE LIMITS FOR URINE DRUG SCREEN Drug  Class       Cutoff (ng/mL) Amphetamine      1000 Barbiturate      200 Benzodiazepine   092 Tricyclics       330 Opiates          300 Cocaine          300 THC              50   Comprehensive metabolic panel     Status: Abnormal   Collection Time: 09/03/15  2:30 AM  Result Value Ref Range   Sodium 137 135 - 145 mmol/L   Potassium 3.7 3.5 - 5.1 mmol/L    Comment: DELTA CHECK NOTED   Chloride 106 101 - 111 mmol/L   CO2 21 (L) 22 - 32 mmol/L   Glucose, Bld 91 65 - 99 mg/dL   BUN 9 6 - 20 mg/dL   Creatinine, Ser 0.81 0.61 - 1.24 mg/dL   Calcium 7.7 (L) 8.9 - 10.3 mg/dL   Total Protein 4.9 (L) 6.5 - 8.1 g/dL   Albumin 2.4 (L) 3.5 - 5.0 g/dL   AST 19 15 - 41 U/L   ALT 12 (L) 17 - 63 U/L   Alkaline Phosphatase 65 38 - 126 U/L   Total Bilirubin 0.8 0.3 - 1.2 mg/dL   GFR calc non Af Amer >60 >60 mL/min   GFR calc Af Amer >60 >60 mL/min    Comment: (NOTE) The eGFR has been calculated using the CKD EPI equation. This calculation has not been validated in all clinical situations. eGFR's persistently <60 mL/min signify possible Chronic Kidney Disease.    Anion gap 10 5 - 15  CBC     Status: Abnormal   Collection Time: 09/03/15  2:30 AM  Result Value Ref Range   WBC 5.8 4.0 - 10.5 K/uL   RBC 3.79 (L) 4.22 - 5.81 MIL/uL   Hemoglobin 11.3 (L) 13.0 - 17.0 g/dL    Comment: DELTA CHECK NOTED REPEATED TO VERIFY    HCT 34.4 (L) 39.0 - 52.0 %   MCV 90.8 78.0 - 100.0 fL   MCH 29.8 26.0 - 34.0 pg   MCHC 32.8 30.0 - 36.0 g/dL   RDW 15.8 (H) 11.5 - 15.5 %   Platelets 276 150 - 400 K/uL  Protime-INR     Status: None  Collection Time: 09/03/15  2:30 AM  Result Value Ref Range   Prothrombin Time 13.6 11.6 - 15.2 seconds   INR 1.02 0.00 - 1.49  APTT     Status: None   Collection Time: 09/03/15  2:30 AM  Result Value Ref Range   aPTT 32 24 - 37 seconds    Current Facility-Administered Medications  Medication Dose Route Frequency Provider Last Rate Last Dose  . 0.9 %  sodium chloride  infusion   Intravenous Continuous Nita Sells, MD 75 mL/hr at 09/03/15 0815    . acetaminophen (TYLENOL) tablet 650 mg  650 mg Oral Q6H PRN Samella Parr, NP   650 mg at 09/03/15 2353   Or  . acetaminophen (TYLENOL) suppository 650 mg  650 mg Rectal Q6H PRN Samella Parr, NP      . albuterol (PROVENTIL) (2.5 MG/3ML) 0.083% nebulizer solution 2.5 mg  2.5 mg Nebulization Q4H PRN Nita Sells, MD      . alum & mag hydroxide-simeth (MAALOX/MYLANTA) 200-200-20 MG/5ML suspension 30 mL  30 mL Oral Q6H PRN Samella Parr, NP      . cloNIDine (CATAPRES) tablet 0.2 mg  0.2 mg Oral TID Samella Parr, NP   0.2 mg at 09/03/15 0935  . diazepam (VALIUM) injection 5-10 mg  5-10 mg Intravenous Q1H PRN Samella Parr, NP   5 mg at 09/03/15 0824  . enoxaparin (LOVENOX) injection 40 mg  40 mg Subcutaneous Q24H Samella Parr, NP   40 mg at 09/02/15 1824  . feeding supplement (ENSURE ENLIVE) (ENSURE ENLIVE) liquid 237 mL  237 mL Oral TID BM Nita Sells, MD   237 mL at 09/03/15 1200  . FLUoxetine (PROZAC) capsule 20 mg  20 mg Oral Daily Nita Sells, MD   20 mg at 09/03/15 1219  . folic acid (FOLVITE) tablet 1 mg  1 mg Oral Daily Assunta Found Stone, RPH   1 mg at 09/03/15 1220  . gabapentin (NEURONTIN) capsule 100 mg  100 mg Oral TID Corena Pilgrim, MD      . hydrALAZINE (APRESOLINE) injection 10 mg  10 mg Intravenous Q6H PRN Samella Parr, NP      . LORazepam (ATIVAN) injection 0-4 mg  0-4 mg Intravenous Q12H Jarome Matin, MD   2 mg at 09/03/15 0935  . metoprolol tartrate (LOPRESSOR) tablet 25 mg  25 mg Oral QAC breakfast Nita Sells, MD   25 mg at 09/03/15 0944  . multivitamin with minerals tablet 1 tablet  1 tablet Oral Daily Nita Sells, MD   1 tablet at 09/03/15 1219  . nicotine (NICODERM CQ - dosed in mg/24 hours) patch 21 mg  21 mg Transdermal Daily Samella Parr, NP   21 mg at 09/03/15 0945  . pantoprazole (PROTONIX) EC tablet 80 mg  80 mg Oral Daily  Nita Sells, MD   80 mg at 09/03/15 0934  . sodium chloride flush (NS) 0.9 % injection 3 mL  3 mL Intravenous Q12H Samella Parr, NP   3 mL at 09/03/15 1000  . thiamine (B-1) injection 100 mg  100 mg Intravenous Daily Samella Parr, NP   100 mg at 09/02/15 1558  . thiamine (VITAMIN B-1) tablet 100 mg  100 mg Oral Daily Jarome Matin, MD   100 mg at 09/03/15 0934  . traZODone (DESYREL) tablet 100 mg  100 mg Oral QHS Corena Pilgrim, MD        Musculoskeletal: Strength & Muscle Tone: within normal  limits Gait & Station: unsteady Patient leans: Front  Psychiatric Specialty Exam: Review of Systems  Constitutional: Positive for malaise/fatigue and diaphoresis.  HENT: Negative.   Eyes: Negative.   Respiratory: Negative.   Cardiovascular: Negative.   Gastrointestinal: Positive for nausea.  Genitourinary: Negative.   Musculoskeletal: Positive for myalgias.  Skin: Negative.   Neurological: Positive for tremors and weakness.  Endo/Heme/Allergies: Negative.   Psychiatric/Behavioral: Positive for depression and substance abuse. The patient is nervous/anxious and has insomnia.     Blood pressure 148/90, pulse 70, temperature 98.4 F (36.9 C), temperature source Axillary, resp. rate 18, height _0  (1.626 m), weight 69.1 kg (152 lb 5.4 oz), SpO2 99 %.Body mass index is 26.14 kg/(m^2).  General Appearance: Disheveled  Eye Contact::  Good  Speech:  Clear and Coherent  Volume:  Normal  Mood:  Depressed and Dysphoric  Affect:  Constricted  Thought Process:  Negative  Orientation:  Full (Time, Place, and Person)  Thought Content:  Negative  Suicidal Thoughts:  No  Homicidal Thoughts:  No  Memory:  Immediate;   Fair Recent;   Good Remote;   Good  Judgement:  Impaired  Insight:  Shallow  Psychomotor Activity:  Decreased  Concentration:  Fair  Recall:  Good  Fund of Knowledge:Good  Language: Good  Akathisia:  No  Handed:  Right  AIMS (if indicated):     Assets:   Communication Skills Desire for Improvement  ADL's:  Intact  Cognition: WNL  Sleep:   poor   Treatment Plan Summary: Daily contact with patient to assess and evaluate symptoms and progress in treatment.   Medication management: -Continue Prozac 69m daily for depression. -Continue Alcohol detox protocol. -Increase Trazodone to 102mqhs for insomnia -Initiate Gabapentin 10089mid for alcohol withdrawal syndrome.  Disposition: Recommend psychiatric Inpatient admission when medically cleared. Supportive therapy provided about ongoing stressors. Patient will benefit from Alcohol detox and rehabilitation.  Unit Social worker to help with patient placement in the inpatient(dual diagnosis unit).  AkiCorena PilgrimD 09/03/2015 1:02 PM

## 2015-09-03 NOTE — Care Management Note (Signed)
Case Management Note  Patient Details  Name: Samuel Reyes MRN: [id number] Date of Birth: [birth year]  Subjective/Objective:                  ethanol abuse Action/Plan: Discharge planning Expected Discharge Date:                  Expected Discharge Plan:  Home/Self Care  In-House Referral:     Discharge planning Services  CM Consult, Medication Assistance  Post Acute Care Choice:    Choice offered to:     DME Arranged:    DME Agency:     HH Arranged:    HH Agency:     Status of Service:  Completed, signed off  Medicare Important Message Given:    Date Medicare IM Given:    Medicare IM give by:    Date Additional Medicare IM Given:    Additional Medicare Important Message give by:     If discussed at [employer] of Stay Meetings, dates discussed:    Additional Comments: CM notes pt has had [facility] in the past; has a cane at home; has MEDICAID.  Consult for medication needs: pt has Medicaid and does NOT meet criteria for charity med asst for discharge meds through [facility].  If pt is prescribed a NEW medication, a discount may be available; CM will follow progression for needs. Dellie Catholic, RN 09/03/2015, 9:41 AM

## 2015-09-03 NOTE — Progress Notes (Signed)
Initial Nutrition Assessment   INTERVENTION:  Provide Ensure Enlive po BID, each supplement provides 350 kcal and 20 grams of protein Provide Snacks TID   NUTRITION DIAGNOSIS:   Inadequate oral intake related to social / environmental circumstances, poor appetite as evidenced by per patient/family report, energy intake < or equal to 50% for > or equal to 5 days.   GOAL:   Patient will meet greater than or equal to 90% of their needs   MONITOR:   PO intake, Supplement acceptance, Labs, Weight trends, Skin, I & O's  REASON FOR ASSESSMENT:   Malnutrition Screening Tool    ASSESSMENT:   60 year old male with a history of alcohol abuse, tobacco abuse, who presented to the emergency department with complaints of syncope. Patient has been drinking daily for the last 40 years, has been drinking at least 1 case of beer daily. He was on his way to the gas station to obtain cigarettes and beer, however then he had a syncopal episode.   Pt states that he had not eaten any solid food for 6 days PTA, he was only drinking alcohol. He reports eating 50% of breakfast this morning due to nausea and early satiety; expects it will take him a few days to start eating more. He reports losing 18 lbs recently. Per weight history, pt lost down to 132 lbs back in July, but has since regained weight. Pt agreeable to receiving supplements and snacks until PO intake improved. RD emphasized the importance of nutrition and adequate healthful PO intake. Provided and discussed "Sobriety Nutrition Therapy" handout from the Academy of [employer] Dietetics.   Labs: low albumin, low calcium, low hemoglobin, low magnesium  Diet Order:  Diet Heart Room service appropriate?: Yes; Fluid consistency:: Thin  Skin:  Reviewed, no issues  Last BM:  2/3  Height:   Ht Readings from Last 1 Encounters:  09/02/15 5\' 4"  (1.626 m)    Weight:   Wt Readings from Last 1 Encounters:  09/02/15 152 lb 5.4 oz (69.1 kg)     Ideal Body Weight:  59.1 kg  BMI:  Body mass index is 26.14 kg/(m^2).  Estimated Nutritional Needs:   Kcal:  1900-2100  Protein:  80-90 grams  Fluid:  1.9-2.1 L/day  EDUCATION NEEDS:   No education needs identified at this time  [place], LDN Inpatient Clinical Dietitian Pager: [phone] After Hours Pager: [phone]

## 2015-09-03 NOTE — Progress Notes (Signed)
Samuel Reyes [id number] DOB: Apr 21, 1956 DOA: 09/02/2015 PCP: Lorayne Marek, MD  Brief narrative: 60 y/o ? Chronic ethanol abuse 5X 40 ounces of beer on most days Known depression on multiple medications Prior suicidality 2016 last admitted 05/2015 , 02/2015 also has had intentional overdose Esophagitis secondary to nonsteroidals and gastrectomy in the past for PUD  Smoker with known COPD  Prior CVA    Admitted to [facility] with syncope Admission Chem-7 reassuring, LFTs not elevated, hemoglobin 15 but dropped to 11 after IV fluid repletion CIWA 3 CXR benign  Past medical history-As per Problem list Chart reviewed as below-   Consultants:  Psychiatry  Procedures:    Antibiotics:  None    Subjective   Endorses feeling of hopelessness helplessness Tells me he buried his children and his wife Lives with his parents and they kicked him out because of his habits He drinks case of beer a day smokes pack cigarettes a day tells me he cannot get housing with the $700 he gets from [employer] Originally from [place] Dear short of breath and occasionally uses albuterol and Combivent.   Objective    Interim History:   Telemetry: Sinus tachycardia    Objective: Filed Vitals:   09/02/15 2305 09/03/15 0000 09/03/15 0300 09/03/15 0400  BP: 111/84 123/91 118/72 124/72  Pulse: 92 97 88 88  Temp: 98.2 F (36.8 C)   98.2 F (36.8 C)  TempSrc: Oral   Oral  Resp: 23 18 21 20   Height:      Weight:      SpO2: 99% 97% 99% 99%    Intake/Output Summary (Last 24 hours) at 09/03/15 0731 Last data filed at 09/03/15 [id number]  Gross per 24 hour  Intake   2430 ml  Output    800 ml  Net   1630 ml    Exam:  General: EOMI NCAT, flat affect Cardiovascular: S1-S2 no murmur rub or gallop Respiratory: Clinically clear cannot appreciate any added sound nor wheeze Abdomen: Soft nontender nondistended no rebound Skin no lower extremity edema Neuro grossly  intact  Data Reviewed: Basic Metabolic Panel:  Recent Labs Lab 09/02/15 0918 09/02/15 1521 09/03/15 0230  NA 141  --  137  K 4.8  --  3.7  CL 103  --  106  CO2 22  --  21*  GLUCOSE 99  --  91  BUN 9  --  9  CREATININE 0.99  --  0.81  CALCIUM 8.6*  --  7.7*  MG  --  1.5*  --   PHOS  --  3.4  --    Liver Function Tests:  Recent Labs Lab 09/02/15 0918 09/03/15 0230  AST 35 19  ALT 19 12*  ALKPHOS 87 65  BILITOT 1.0 0.8  PROT 6.8 4.9*  ALBUMIN 3.5 2.4*   No results for input(s): LIPASE, AMYLASE in the last 168 hours. No results for input(s): AMMONIA in the last 168 hours. CBC:  Recent Labs Lab 09/02/15 0918 09/03/15 0230  WBC 8.8 5.8  NEUTROABS 6.2  --   HGB 15.4 11.3*  HCT 45.1 34.4*  MCV 91.1 90.8  PLT 367 276   Cardiac Enzymes: No results for input(s): CKTOTAL, CKMB, CKMBINDEX, TROPONINI in the last 168 hours. BNP: Invalid input(s): POCBNP CBG:  Recent Labs Lab 09/02/15 0931  GLUCAP 74    Recent Results (from the past 240 hour(s))  MRSA PCR Screening     Status: None   Collection Time: 09/02/15  5:02 PM  Result Value Ref Range Status   MRSA by PCR NEGATIVE NEGATIVE Final    Comment:        The GeneXpert MRSA Assay (FDA approved for NASAL specimens only), is one component of a comprehensive MRSA colonization surveillance program. It is not intended to diagnose MRSA infection nor to guide or monitor treatment for MRSA infections.      Studies:              All Imaging reviewed and is as per above notation   Scheduled Meds: . cloNIDine  0.2 mg Oral TID  . enoxaparin (LOVENOX) injection  40 mg Subcutaneous Q24H  . folic acid  1 mg Intravenous Daily  . LORazepam  0-4 mg Intravenous Q12H  . nicotine  21 mg Transdermal Daily  . sodium chloride flush  3 mL Intravenous Q12H  . thiamine  100 mg Intravenous Daily  . thiamine  100 mg Oral Daily   Continuous Infusions: . sodium chloride 150 mL/hr at 09/03/15 0136      Assessment/Plan:  1. Acute ethanol withdrawal-CIWA 11-medicated with Ativan. Keep on step down unit and monitor closely for worsening of withdrawal symptoms as drinks relatively large amounts of alcohol. He states that he was "trying to kill himself by drinking but threw up" we will consult psychiatry 2. Major depression-resume home doses of trazodone 50 daily at bedtime as well as Prozac 20 daily 3. Sinus tachycardia restart metoprolol 25 daily. Can continue clonidine 0.2 3 times a day for now if blood pressure supports same 4. Esophagitis with known gastrectomy in the past for PUD-continue Protonix twice a day 5. Prior CVA-hold on aspirin for now 6. Dilutional anemia-likely secondary to IV fluid. Discontinue today 7. Probable COPD in chronic smoker who still smokes-added albuterol every 4 when necessary   Keep on step down today No family at bedside Full [place] psychiatry  Verneita Griffes, MD  [facility] Pager [phone] 09/03/2015, 7:31 AM    LOS: 1 day

## 2015-09-04 LAB — URINE CULTURE

## 2015-09-04 MED ORDER — GI COCKTAIL ~~LOC~~
30.0000 mL | Freq: Two times a day (BID) | ORAL | Status: DC | PRN
  Administered 2015-09-04 – 2015-09-05 (×3): 30 mL via ORAL
  Filled 2015-09-04 (×4): qty 30

## 2015-09-04 NOTE — Progress Notes (Signed)
Samuel Reyes [id number] DOB: 01/30/1956 DOA: 09/02/2015 PCP: Lorayne Marek, MD  Brief narrative: 60 y/o ? Chronic ethanol abuse 5X 40 ounces of beer on most days Known depression on multiple medications Prior suicidality 2016 last admitted 05/2015 , 02/2015 also has had intentional overdose Esophagitis secondary to nonsteroidals and gastrectomy in the past for PUD  Smoker with known COPD  Prior CVA    Admitted to [facility] with syncope Admission Chem-7 reassuring, LFTs not elevated, hemoglobin 15 but dropped to 11 after IV fluid repletion CIWA 3 CXR benign  Past medical history-As per Problem list Chart reviewed as below-   Consultants:  Psychiatry  Procedures:    Antibiotics:  None    Subjective    doing fair. a little tremulous   no nausea no vomiting tolerating diet.  states that he has some abdominal discomfort and is asking for a GI cocktail.    Objective    Interim History:   Telemetry: Sinus tachycardia    Objective: Filed Vitals:   09/04/15 0400 09/04/15 0747 09/04/15 0800 09/04/15 0845  BP: 143/83 121/76    Pulse:  91 74 69  Temp:    97.9 F (36.6 C)  TempSrc:    Axillary  Resp:    20  Height:      Weight:      SpO2:  98%      Intake/Output Summary (Last 24 hours) at 09/04/15 1212 Last data filed at 09/04/15 1200  Gross per 24 hour  Intake   2670 ml  Output    950 ml  Net   1720 ml    Exam:  General: EOMI NCAT, flat affect Cardiovascular: S1-S2 no murmur rub or gallop Respiratory: Clinically clear cannot appreciate any added sound nor wheeze Abdomen: Soft nontender nondistended no rebound Skin no lower extremity edema Neuro grossly intact  Data Reviewed: Basic Metabolic Panel:  Recent Labs Lab 09/02/15 0918 09/02/15 1521 09/03/15 0230  NA 141  --  137  K 4.8  --  3.7  CL 103  --  106  CO2 22  --  21*  GLUCOSE 99  --  91  BUN 9  --  9  CREATININE 0.99  --  0.81  CALCIUM 8.6*  --  7.7*  MG  --   1.5*  --   PHOS  --  3.4  --    Liver Function Tests:  Recent Labs Lab 09/02/15 0918 09/03/15 0230  AST 35 19  ALT 19 12*  ALKPHOS 87 65  BILITOT 1.0 0.8  PROT 6.8 4.9*  ALBUMIN 3.5 2.4*   No results for input(s): LIPASE, AMYLASE in the last 168 hours. No results for input(s): AMMONIA in the last 168 hours. CBC:  Recent Labs Lab 09/02/15 0918 09/03/15 0230  WBC 8.8 5.8  NEUTROABS 6.2  --   HGB 15.4 11.3*  HCT 45.1 34.4*  MCV 91.1 90.8  PLT 367 276   Cardiac Enzymes: No results for input(s): CKTOTAL, CKMB, CKMBINDEX, TROPONINI in the last 168 hours. BNP: Invalid input(s): POCBNP CBG:  Recent Labs Lab 09/02/15 0931  GLUCAP 74    Recent Results (from the past 240 hour(s))  MRSA PCR Screening     Status: None   Collection Time: 09/02/15  5:02 PM  Result Value Ref Range Status   MRSA by PCR NEGATIVE NEGATIVE Final    Comment:        The GeneXpert MRSA Assay (FDA approved for NASAL specimens only), is one  component of a comprehensive MRSA colonization surveillance program. It is not intended to diagnose MRSA infection nor to guide or monitor treatment for MRSA infections.   Urine culture     Status: None   Collection Time: 09/02/15  6:05 PM  Result Value Ref Range Status   Specimen Description URINE, RANDOM  Final   Special Requests NONE  Final   Culture 1,000 COLONIES/mL INSIGNIFICANT GROWTH  Final   Report Status 09/04/2015 FINAL  Final     Studies:              All Imaging reviewed and is as per above notation   Scheduled Meds: . cloNIDine  0.2 mg Oral TID  . enoxaparin (LOVENOX) injection  40 mg Subcutaneous Q24H  . feeding supplement (ENSURE ENLIVE)  237 mL Oral TID BM  . FLUoxetine  20 mg Oral Daily  . folic acid  1 mg Oral Daily  . gabapentin  100 mg Oral TID  . metoprolol tartrate  25 mg Oral QAC breakfast  . multivitamin with minerals  1 tablet Oral Daily  . nicotine  21 mg Transdermal Daily  . pantoprazole  80 mg Oral Daily  .  sodium chloride flush  3 mL Intravenous Q12H  . thiamine  100 mg Intravenous Daily  . thiamine  100 mg Oral Daily  . traZODone  100 mg Oral QHS   Continuous Infusions: . sodium chloride 75 mL/hr at 09/04/15 1131     Assessment/Plan:  1. Acute ethanol withdrawal-CIWA 11- 6 medicated with Ativan. Keep on step down unit and monitor closely for worsening of withdrawal symptoms as drinks relatively large amounts of alcohol.  2. Major depression-resume home doses of trazodone 50 daily at bedtime as well as Prozac 20 daily-- psychiatry was increased his  trazodone to 100 mg on 09/03/15  , gabapentin was added as well this admission to help with alcohol withdrawal 100 3 times a day. 3. Sinus tachycardia restart metoprolol 25 daily. Can continue clonidine 0.2 3 times a day for now if blood pressure supports same 4. Esophagitis with known gastrectomy in the past for PUD-continue Protonix twice a day 5. Prior CVA-hold on aspirin for now 6. Dilutional anemia-likely secondary to IV fluid. Discontinue today 7. Probable COPD in chronic smoker who still smokes-added albuterol every 4 when necessary   Keep on step down today No family at bedside Full [place] psychiatry  Verneita Griffes, MD  [facility] Pager [phone] 09/04/2015, 12:12 PM    LOS: 2 days

## 2015-09-05 LAB — T3: T3 TOTAL: 83 ng/dL (ref 71–180)

## 2015-09-05 LAB — TROPONIN I

## 2015-09-05 LAB — BASIC METABOLIC PANEL
Anion gap: 7 (ref 5–15)
BUN: 8 mg/dL (ref 6–20)
CO2: 28 mmol/L (ref 22–32)
Calcium: 8.3 mg/dL — ABNORMAL LOW (ref 8.9–10.3)
Chloride: 105 mmol/L (ref 101–111)
Creatinine, Ser: 0.85 mg/dL (ref 0.61–1.24)
Glucose, Bld: 97 mg/dL (ref 65–99)
POTASSIUM: 4.1 mmol/L (ref 3.5–5.1)
SODIUM: 140 mmol/L (ref 135–145)

## 2015-09-05 NOTE — Progress Notes (Signed)
Samuel Reyes [id number] DOB: [birth year] DOA: 09/02/2015 PCP: Lorayne Marek, MD  Brief narrative: 60 y/o ? Chronic ethanol abuse 5X 40 ounces of beer on most days Known depression on multiple medications Prior suicidality 2016 last admitted 05/2015 , 02/2015 also has had intentional overdose Esophagitis secondary to nonsteroidals and gastrectomy in the past for PUD  Smoker with known COPD  Prior CVA    Admitted to [facility] with syncope Admission Chem-7 reassuring, LFTs not elevated, hemoglobin 15 but dropped to 11 after IV fluid repletion CIWA 3 CXR benign  Past medical history-As per Problem list Chart reviewed as below-   Consultants:  Psychiatry  Procedures:    Antibiotics:  None    Subjective   Fair other wise No new issues  more hopeful today Some mild pains overnight in the chest pain-like sensation coming going no heaviness no radiation   Objective    Interim History:   Telemetry: Sinus tachycardia    Objective: Filed Vitals:   09/05/15 0319 09/05/15 0320 09/05/15 0809 09/05/15 0900  BP:  106/76 120/76   Pulse:  59 76   Temp: 97.8 F (36.6 C)   98 F (36.7 C)  TempSrc: Oral   Oral  Resp:  19 18   Height:      Weight:      SpO2:  99% 99%     Intake/Output Summary (Last 24 hours) at 09/05/15 1034 Last data filed at 09/05/15 0900  Gross per 24 hour  Intake   1770 ml  Output   2726 ml  Net   -956 ml    Exam:  General: EOMI NCAT, flat affect Cardiovascular: S1-S2 no murmur rub or gallop Respiratory: Clinically clear cannot appreciate any added sound nor wheeze Abdomen: Soft nontender nondistended no rebound Skin no lower extremity edema Neuro grossly intact  Data Reviewed: Basic Metabolic Panel:  Recent Labs Lab 09/02/15 0918 09/02/15 1521 09/03/15 0230 09/05/15 0333  NA 141  --  137 140  K 4.8  --  3.7 4.1  CL 103  --  106 105  CO2 22  --  21* 28  GLUCOSE 99  --  91 97  BUN 9  --  9 8  CREATININE  0.99  --  0.81 0.85  CALCIUM 8.6*  --  7.7* 8.3*  MG  --  1.5*  --   --   PHOS  --  3.4  --   --    Liver Function Tests:  Recent Labs Lab 09/02/15 0918 09/03/15 0230  AST 35 19  ALT 19 12*  ALKPHOS 87 65  BILITOT 1.0 0.8  PROT 6.8 4.9*  ALBUMIN 3.5 2.4*   No results for input(s): LIPASE, AMYLASE in the last 168 hours. No results for input(s): AMMONIA in the last 168 hours. CBC:  Recent Labs Lab 09/02/15 0918 09/03/15 0230  WBC 8.8 5.8  NEUTROABS 6.2  --   HGB 15.4 11.3*  HCT 45.1 34.4*  MCV 91.1 90.8  PLT 367 276   Cardiac Enzymes: No results for input(s): CKTOTAL, CKMB, CKMBINDEX, TROPONINI in the last 168 hours. BNP: Invalid input(s): POCBNP CBG:  Recent Labs Lab 09/02/15 0931  GLUCAP 74    Recent Results (from the past 240 hour(s))  MRSA PCR Screening     Status: None   Collection Time: 09/02/15  5:02 PM  Result Value Ref Range Status   MRSA by PCR NEGATIVE NEGATIVE Final    Comment:  The GeneXpert MRSA Assay (FDA approved for NASAL specimens only), is one component of a comprehensive MRSA colonization surveillance program. It is not intended to diagnose MRSA infection nor to guide or monitor treatment for MRSA infections.   Urine culture     Status: None   Collection Time: 09/02/15  6:05 PM  Result Value Ref Range Status   Specimen Description URINE, RANDOM  Final   Special Requests NONE  Final   Culture 1,000 COLONIES/mL INSIGNIFICANT GROWTH  Final   Report Status 09/04/2015 FINAL  Final     Studies:              All Imaging reviewed and is as per above notation   Scheduled Meds: . cloNIDine  0.2 mg Oral TID  . enoxaparin (LOVENOX) injection  40 mg Subcutaneous Q24H  . feeding supplement (ENSURE ENLIVE)  237 mL Oral TID BM  . FLUoxetine  20 mg Oral Daily  . folic acid  1 mg Oral Daily  . gabapentin  100 mg Oral TID  . metoprolol tartrate  25 mg Oral QAC breakfast  . multivitamin with minerals  1 tablet Oral Daily  .  nicotine  21 mg Transdermal Daily  . pantoprazole  80 mg Oral Daily  . sodium chloride flush  3 mL Intravenous Q12H  . thiamine  100 mg Oral Daily  . traZODone  100 mg Oral QHS   Continuous Infusions:     Assessment/Plan:  1. Acute ethanol withdrawal- CIWA has been below 8.  Feel reasonable for tele transfer 2. Chest pain-unlikely to be cardiac. If recurs would just get a troponin or EKG. Not concerned at this time for ACS 3. Major depression-resume home doses of trazodone 50 daily at bedtime as well as Prozac 20 daily-- psychiatry was increased his  trazodone to 100 mg on 09/03/15  , gabapentin was added as well this admission to help with alcohol withdrawal 100 3 times a day.  Await further psyche input-Social worker contacted for placement for dual diagnosis-withdrawal and mdd 4. Sinus tachycardia resolved after restart metoprolol 25 daily. Can continue clonidine 0.2 3 tid 5. Esophagitis with known gastrectomy in the past for PUD-continue Protonix twice a day 6. Prior CVA-hold on aspirin for now and resume on discharge 7. Dilutional anemia-likely secondary to IV fluid. Repeat cbc in am 8. Probable COPD in chronic smoker who still smokes-added albuterol every 4 when necessary    No family at bedside Full [facility] Transfer to telemetry unit and monitor--nearing discharge   [facility] Pager [phone] 09/05/2015, 10:34 AM    LOS: 3 days

## 2015-09-06 LAB — CBC
HEMATOCRIT: 36.8 % — AB (ref 39.0–52.0)
Hemoglobin: 11.4 g/dL — ABNORMAL LOW (ref 13.0–17.0)
MCH: 29 pg (ref 26.0–34.0)
MCHC: 31 g/dL (ref 30.0–36.0)
MCV: 93.6 fL (ref 78.0–100.0)
PLATELETS: 266 10*3/uL (ref 150–400)
RBC: 3.93 MIL/uL — ABNORMAL LOW (ref 4.22–5.81)
RDW: 15.5 % (ref 11.5–15.5)
WBC: 9.4 10*3/uL (ref 4.0–10.5)

## 2015-09-06 LAB — BASIC METABOLIC PANEL
ANION GAP: 8 (ref 5–15)
BUN: 14 mg/dL (ref 6–20)
CALCIUM: 8.4 mg/dL — AB (ref 8.9–10.3)
CO2: 27 mmol/L (ref 22–32)
Chloride: 103 mmol/L (ref 101–111)
Creatinine, Ser: 0.9 mg/dL (ref 0.61–1.24)
GFR calc Af Amer: >60 mL/min (ref >60)
GFR calc non Af Amer: >60 mL/min (ref >60)
GLUCOSE: 92 mg/dL (ref 65–99)
Potassium: 3.9 mmol/L (ref 3.5–5.1)
Sodium: 138 mmol/L (ref 135–145)

## 2015-09-06 MED ORDER — CLONIDINE HCL 0.2 MG PO TABS: 0.2000 mg | ORAL_TABLET | Freq: Three times a day (TID) | ORAL | Status: DC

## 2015-09-06 MED ORDER — TRAZODONE HCL 100 MG PO TABS: 100.0000 mg | ORAL_TABLET | Freq: Every day | ORAL | Status: DC

## 2015-09-06 MED ORDER — GABAPENTIN 100 MG PO CAPS: 100.0000 mg | ORAL_CAPSULE | Freq: Three times a day (TID) | ORAL | Status: DC

## 2015-09-06 NOTE — Progress Notes (Signed)
Pt transferred from [facility] this pm. Alert, oriented and able to voice needs. Denies pain at this time. Suicide sitter at bedside

## 2015-09-06 NOTE — Progress Notes (Signed)
Pt not placed on contact precautions as he was negative by pcr screening on this admission per infection prevention nurse

## 2015-09-06 NOTE — Discharge Summary (Signed)
Physician Discharge Summary  Samuel Reyes [id number] DOB: [birth year] DOA: 09/02/2015  PCP: Lorayne Marek, MD  Admit date: 09/02/2015 Discharge date: 09/06/2015  Time spent: 40 minutes  Recommendations for Outpatient Follow-up:  1. Needs management at Psychiatric facility for depression, suicidality  Discharge Diagnoses:  Principal Problem:   Alcohol dependence with uncomplicated withdrawal ([place]) Active Problems:   Hypertensive urgency   COPD (chronic obstructive pulmonary disease) (HCC)   Smoker   Alcohol dependence ([place])   Major depression, recurrent (HCC)   Major depressive disorder, recurrent, severe without psychotic features ([place])   Protein-calorie malnutrition, severe (HCC)   Hepatic steatosis   Dehydration, severe   Alcohol withdrawal (HCC)   Abnormal TSH   Alcohol withdrawal syndrome ([place])   Discharge Condition: fair  Diet recommendation:  Heart healthy  Filed Weights   09/02/15 0919 09/02/15 1700  Weight: 68.04 kg (150 lb) 69.1 kg (152 lb 5.4 oz)    History of present illness:  60 y/o ? Chronic ethanol abuse 5X 40 ounces of beer on most days Known depression on multiple medications Prior suicidality 2016 last admitted 05/2015 , 02/2015 also has had intentional overdose Esophagitis secondary to nonsteroidals and gastrectomy in the past for PUD  Smoker with known COPD  Prior CVA    Admitted to [facility] with syncope Admission Chem-7 reassuring, LFTs not elevated, hemoglobin 15 but dropped to 11 after IV fluid repletion CIWA 3 CXR benign  Hospital Course:   1. Acute ethanol withdrawal- CIWA has been below 8. patient has been reasonably detoxed per protocol and can continue care at Short term psychiatric facility 2. Chest pain-transient and related to GERD most probably.  EKG no changes and troponin done wasn't elevated 3. Major depression-resume home doses of trazodone 50 daily at bedtime as well as Prozac 20 daily-- psychiatry was  increased his trazodone to 100 mg on 09/03/15 , gabapentin continued to help with alcohol withdrawal 100 3 times a day- on d/c [wasn;t taking gabepentin prior to admit although had been prescribed]. Await further psyche input-Social worker contacted for placement for dual diagnosis-withdrawal and mdd 4. Sinus tachycardia resolved after restart metoprolol 25 daily. Can continue clonidine 0.2 3 tid 5. Esophagitis with known gastrectomy in the past for PUD-continue Protonix twice a day 6. Prior CVA-hold on aspirin for now and resume on discharge 7. Dilutional anemia-likely secondary to IV fluid. Repeat cbc showed stability 8. Probable COPD in chronic smoker who still smokes-added albuterol every 4 when necessary-may continue as OP   Consultations:  pscyhiatry  Discharge Exam: Filed Vitals:   09/06/15 0700 09/06/15 0747  BP:  94/67  Pulse: 63   Temp:  97.6 F (36.4 C)  Resp: 15    No overt complaitns General: eomi ncat Cardiovascular:  s1 s2 no m/r/g Respiratory:  Clear no added sound   Discharge Instructions   Discharge Instructions    Diet - low sodium heart healthy    Complete by:  As directed      Increase activity slowly    Complete by:  As directed           Current Discharge Medication List    START taking these medications   Details  cloNIDine (CATAPRES) 0.2 MG tablet Take 1 tablet (0.2 mg total) by mouth 3 (three) times daily. Qty: 60 tablet, Refills: 11      CONTINUE these medications which have CHANGED   Details  gabapentin (NEURONTIN) 100 MG capsule Take 1 capsule (100 mg total) by mouth 3 (  three) times daily. Qty: 90 capsule, Refills: 0    traZODone (DESYREL) 100 MG tablet Take 1 tablet (100 mg total) by mouth at bedtime.      CONTINUE these medications which have NOT CHANGED   Details  Aspirin-Acetaminophen-Caffeine (GOODY HEADACHE PO) Take 1 packet by mouth every 8 (eight) hours as needed (pain).    albuterol (PROVENTIL HFA;VENTOLIN HFA) 108 (90  BASE) MCG/ACT inhaler Inhale 1-2 puffs into the lungs every 4 (four) hours as needed for wheezing. Qty: 1 Inhaler, Refills: 0    FLUoxetine (PROZAC) 20 MG capsule Take 1 capsule (20 mg total) by mouth daily. Qty: 30 capsule, Refills: 0    Ipratropium-Albuterol (COMBIVENT) 20-100 MCG/ACT AERS respimat Inhale 1 puff into the lungs every 6 (six) hours as needed for wheezing. Qty: 1 Inhaler, Refills: 0    metoprolol tartrate (LOPRESSOR) 25 MG tablet Take 1 tablet (25 mg total) by mouth daily before breakfast. Qty: 30 tablet, Refills: 0    Multiple Vitamin (MULTIVITAMIN WITH MINERALS) TABS tablet Take 1 tablet by mouth daily. Qty: 30 tablet, Refills: 0    naproxen (NAPROSYN) 500 MG tablet Take 1 tablet (500 mg total) by mouth 2 (two) times daily with a meal. Qty: 30 tablet, Refills: 0    pantoprazole (PROTONIX) 40 MG tablet Take 2 tablets (80 mg total) by mouth daily. Qty: 30 tablet, Refills: 0      STOP taking these medications     clindamycin (CLEOCIN) 300 MG capsule        Allergies  Allergen Reactions  . Bee Venom Anaphylaxis  . Penicillins Rash    Has patient had a PCN reaction causing immediate rash, facial/tongue/throat swelling, SOB or lightheadedness with hypotension: {Yes Has patient had a PCN reaction causing severe rash involving mucus membranes or skin necrosis: NO Has patient had a PCN reaction that required hospitalization {Yes Has patient had a PCN reaction occurring within the last 10 years: NO If all of the above answers are "NO", then may proceed with Cephalosporin use.       The results of significant diagnostics from this hospitalization (including imaging, microbiology, ancillary and laboratory) are listed below for reference.    Significant Diagnostic Studies: Dg Chest 2 View  09/02/2015  CLINICAL DATA:  Tachycardia.  COPD. EXAM: CHEST  2 VIEW COMPARISON:  Chest radiograph 06/15/2015. FINDINGS: Multiple monitoring leads overlie the patient. Stable  cardiac and mediastinal contours. No consolidative pulmonary opacities. No pleural effusion or pneumothorax. Thoracic spine is unremarkable. IMPRESSION: No acute cardiopulmonary process. Electronically Signed   By: Lovey Newcomer M.D.   On: 09/02/2015 17:49    Microbiology: Recent Results (from the past 240 hour(s))  MRSA PCR Screening     Status: None   Collection Time: 09/02/15  5:02 PM  Result Value Ref Range Status   MRSA by PCR NEGATIVE NEGATIVE Final    Comment:        The GeneXpert MRSA Assay (FDA approved for NASAL specimens only), is one component of a comprehensive MRSA colonization surveillance program. It is not intended to diagnose MRSA infection nor to guide or monitor treatment for MRSA infections.   Urine culture     Status: None   Collection Time: 09/02/15  6:05 PM  Result Value Ref Range Status   Specimen Description URINE, RANDOM  Final   Special Requests NONE  Final   Culture 1,000 COLONIES/mL INSIGNIFICANT GROWTH  Final   Report Status 09/04/2015 FINAL  Final     Labs: Basic Metabolic  Panel:  Recent Labs Lab 09/02/15 0918 09/02/15 1521 09/03/15 0230 09/05/15 0333 09/06/15 0240  NA 141  --  137 140 138  K 4.8  --  3.7 4.1 3.9  CL 103  --  106 105 103  CO2 22  --  21* 28 27  GLUCOSE 99  --  91 97 92  BUN 9  --  9 8 14   CREATININE 0.99  --  0.81 0.85 0.90  CALCIUM 8.6*  --  7.7* 8.3* 8.4*  MG  --  1.5*  --   --   --   PHOS  --  3.4  --   --   --    Liver Function Tests:  Recent Labs Lab 09/02/15 0918 09/03/15 0230  AST 35 19  ALT 19 12*  ALKPHOS 87 65  BILITOT 1.0 0.8  PROT 6.8 4.9*  ALBUMIN 3.5 2.4*   No results for input(s): LIPASE, AMYLASE in the last 168 hours. No results for input(s): AMMONIA in the last 168 hours. CBC:  Recent Labs Lab 09/02/15 0918 09/03/15 0230 09/06/15 0240  WBC 8.8 5.8 9.4  NEUTROABS 6.2  --   --   HGB 15.4 11.3* 11.4*  HCT 45.1 34.4* 36.8*  MCV 91.1 90.8 93.6  PLT 367 276 266   Cardiac  Enzymes:  Recent Labs Lab 09/05/15 1414  TROPONINI <0.03   BNP: BNP (last 3 results)  Recent Labs  12/10/14 1339 01/04/15 1432 06/15/15 1325  BNP 46.5 20.9 41.3    ProBNP (last 3 results) No results for input(s): PROBNP in the last 8760 hours.  CBG:  Recent Labs Lab 09/02/15 0931  GLUCAP 74       Signed:  Nita Sells MD   [facility] 09/06/2015, 8:16 AM

## 2015-09-07 DIAGNOSIS — G4701 Insomnia due to medical condition: Secondary | ICD-10-CM

## 2015-09-07 DIAGNOSIS — F339 Major depressive disorder, recurrent, unspecified: Secondary | ICD-10-CM

## 2015-09-07 MED ORDER — NYSTATIN 100000 UNIT/ML MT SUSP
5.0000 mL | Freq: Four times a day (QID) | OROMUCOSAL | Status: DC
  Administered 2015-09-07 (×2): 500000 [IU] via ORAL
  Filled 2015-09-07 (×2): qty 5

## 2015-09-07 MED ORDER — NYSTATIN 100000 UNIT/ML MT SUSP: 5.0000 mL | Freq: Four times a day (QID) | OROMUCOSAL | Status: DC

## 2015-09-07 MED ORDER — ENSURE ENLIVE PO LIQD: 237.0000 mL | ORAL | Status: DC

## 2015-09-07 NOTE — Progress Notes (Signed)
  He is stable for discharge since 09/06/15 and awaiting placement as per S/W. See updated d/c summary dated 09/06/14  Verneita Griffes, MD [facility] [phone]

## 2015-09-07 NOTE — Progress Notes (Signed)
Call placed again to social worker covering unit and message left for her to  call unit. Pt is still asking to talk to her about discharge plans

## 2015-09-07 NOTE — Consult Note (Signed)
[facility] Consult follow up  Reason for Consult: alcohol withdrawal, depression Referring Physician:  Dr. Verlon Au Patient Identification: Normal Recinos MRN:  [id number] Principal Diagnosis: Alcohol dependence with uncomplicated withdrawal [facility]) Diagnosis:   Patient Active Problem List   Diagnosis Date Noted  . Dehydration, severe [E86.0] 09/02/2015  . Alcohol withdrawal ([place]) [F10.239] 09/02/2015  . Abnormal TSH [R79.89] 09/02/2015  . Alcohol withdrawal syndrome ([place]) [F10.239] 09/02/2015  . Hypochloremia [E87.8]   . Hepatic steatosis [K76.0] 02/05/2015  . Pressure ulcer [L89.90] 02/05/2015  . Protein-calorie malnutrition, severe ([place]) [E43] 01/19/2015  . Seizures ([place]) [R56.9] 01/19/2015  . GERD without esophagitis [K21.9] 01/05/2015  . Major depressive disorder, recurrent, severe without psychotic features ([place]) [F33.2]   . Alcohol dependence with uncomplicated withdrawal ([place]) [F10.230]   . Suicidal ideations [R45.851] 07/21/2014  . Drug overdose [T50.901A] 07/21/2014  . Alcohol dependence ([place]) [F10.20] 03/09/2014  . Major depression, recurrent ([place]) [F33.9] 03/09/2014  . PTSD (post-traumatic stress disorder) [F43.10] 03/09/2014  . Recurrent ventral incisional hernia [K43.2] 01/20/2014  . Incisional hernia, without obstruction or gangrene [K43.2] 11/26/2013  . Colon polyps [K63.5] 11/26/2013  . DJD (degenerative joint disease) [M19.90] 10/28/2013  . Smoking [Z72.0] 10/28/2013  . Knee pain [M25.569] 06/15/2013  . Hypertensive urgency [I16.0] 02/17/2013  . FH: lung cancer [Z80.1] 02/17/2013  . COPD (chronic obstructive pulmonary disease) ([place]) [J44.9] 02/17/2013  . Smoker [Z72.0] 02/17/2013  . GERD (gastroesophageal reflux disease) [K21.9] 02/17/2013  . Hiatal hernia [K44.9] 02/17/2013  . Bereavement [Z63.4] 02/02/2013    Total Time spent with patient: 30 minutes  Subjective:   Samuel Reyes is a 60 y.o. male patient admitted with alcohol withdrawal  and depression.  HPI:  Thank you for asking me to do a psychiatric consult on Samuel Reyes, a 60 y.o. Male, widower, with long history of  Alcohol use disorder and depression who presents to the hospital with worsening depressive symptoms, passive suicidal thoughts with no specific plan but says he has no desire to continue living. Patient reports that he started drinking 40 years ago after the death of his son, his alcohol used escalated after his wife died few years ago. Prior to this admission, he states that he was so depressed and he tried to drink himself to death. He has been drinking everyday since his wife passed away three years ago, reporting drinking up to one case of 24-12oz beers daily . Patient did report one prior suicide attempt a week after his wife's death three years ago, where patient reported he sliced both wrists open receiving medical attention. Patient has limited supports and reported both of his sons are also deceased with one living daughter that he is not in contact with for the past 6 years. Patient stated he received services after his wife's death from [facility] where he saw Dr. Glennon Mac who prescribed medication Prozac which helps his depressive symptoms whenever he takes it. However, he has been drinking more, feeling more depressed, hopeless, since he became homeless. He reports history of DTs and  Blackouts due to alcohol withdrawal and intoxication. He received alcohol [facility]abl[facility] three months ago, he was sober for 2 months before he started drinking again. Today, he denies suicidal thoughts, psychosis or delusional thinking. However, he stressed out and overwhelmed for being homeless. Past Psychiatric History: Alcohol use disorder, MDD  Interval History: Patient has been stable without symptoms of depression, anxiety and suicide or homicide ideation. He has plan of working with a case Freight forwarder  to find place of his own and currently living in a  motel. He denied craving for alcohol or other drug of abuse. He is sober and wants to stay sober. He is willing to take his current medications and follow up with [facility].   Risk to Self: Suicidal Ideation: Yes-Currently Present (Passive but pt admitts he could not contract for safety ) Suicidal Intent: No-Not Currently/Within Last 6 Months (pt. did state when asked about SI "he would figure it out") Is patient at risk for suicide?: Yes Suicidal Plan?: No-Not Currently/Within Last 6 Months Access to Means: No What has been your use of drugs/alcohol within the last 12 months?: daily use How many times?: 1 Other Self Harm Risks: Excessive ETOH use. Triggers for Past Attempts: Other (Comment) (Loss of wife three years ago) Intentional Self Injurious Behavior: Cutting (Pt. admitted to self harm three years ago) Comment - Self Injurious Behavior: Patient cut his wrist after death of wife Risk to Others: Homicidal Ideation: No Thoughts of Harm to Others: No Current Homicidal Intent: No Current Homicidal Plan: No Access to Homicidal Means: No Identified Victim: na History of harm to others?: No Assessment of Violence: None Noted Violent Behavior Description: na Does patient have access to weapons?: No (Patient has weapons at his mother's home) Criminal Charges Pending?: No Does patient have a court date: No Prior Inpatient Therapy: Prior Inpatient Therapy: No Prior Therapy Dates: na Prior Therapy Facilty/Provider(s): [facility] Reason for Treatment: SA use, depression Prior Outpatient Therapy: Prior Outpatient Therapy: Yes Prior Therapy Dates: Last three months [facility] Prior Therapy Facilty/Provider(s): [facility] for two years Reason for Treatment: Sa use, depression Does patient have an ACCT team?: No Does patient have Intensive In-House Services?  : No Does patient have [facility] services? : No Does patient have P4[facility] services?: No  Past Medical History:  Past Medical History   Diagnosis Date  . Cirrhosis ([place])   . Degenerative joint disease   . Hypertension   . Gastric ulcer   . Depression   . Asthma   . Mitral valve prolapse 2002  . H/O hiatal hernia   . COPD (chronic obstructive pulmonary disease) ([place])   . TIA (transient ischemic attack)     2010  . Stroke [facility])     TIA - 2010 - no deficits   . Shortness of breath     with exertion   . GERD (gastroesophageal reflux disease)   . Headache(784.0)   . Hiatal hernia 1982  . Degenerative joint disease   . Anxiety   . Alcohol abuse     Past Surgical History  Procedure Laterality Date  . Gastrectomy    . Shoulder surgery Bilateral     3 surgeries on on left, 2 surgeries on right   . Rt knee arthroscopic surgery    . Back surgery      3 cervical spine surgeries C4-C5 fused  . Hernia repair    . Finger surgery Left     2nd, 3rd, & 4th fingers were cut off by table saw and reattached  . Colonoscopy N/A 01/04/2014    Procedure: COLONOSCOPY;  Surgeon: Danie Binder, MD;  Location: [facility] ENDO SUITE;  Service: Endoscopy;  Laterality: N/A;  1:45  . Esophagogastroduodenoscopy N/A 01/04/2014    Procedure: ESOPHAGOGASTRODUODENOSCOPY (EGD);  Surgeon: Danie Binder, MD;  Location: [facility] ENDO SUITE;  Service: Endoscopy;  Laterality: N/A;  . Incisional hernia repair N/A 01/20/2014    Procedure: LAPAROSCOPIC RECURRENT  INCISIONAL HERNIA with  mesh;  Surgeon: Edward Jolly, MD;  Location: [facility] ORS;  Service: General;  Laterality: N/A;   Family History:  Family History  Problem Relation Age of Onset  . Cancer Father     bone  . Cancer Brother     lungs  . Stroke Maternal Grandmother   . Colon cancer Neg Hx   . Asthma Son     died at age 50 in his sleep   . Spina bifida Son     died at age 67    Family Psychiatric  History:  Social History:  History  Alcohol Use  . Yes    Comment: 24 beers/day     History  Drug Use No    Comment: denid using any drugs    Social History   Social History  . Marital  Status: Widowed    Spouse Name: N/A  . Number of Children: N/A  . Years of Education: N/A   Occupational History  . Disability    Social History Main Topics  . Smoking status: Current Every Day Smoker -- 1.00 packs/day for 45 years    Types: Cigarettes    Start date: 07/30/1966  . Smokeless tobacco: Never Used  . Alcohol Use: Yes     Comment: 24 beers/day  . Drug Use: No     Comment: denid using any drugs  . Sexual Activity: Not Asked   Other Topics Concern  . None   Social History Narrative   Additional Social History:    Allergies:   Allergies  Allergen Reactions  . Bee Venom Anaphylaxis  . Penicillins Rash    Has patient had a PCN reaction causing immediate rash, facial/tongue/throat swelling, SOB or lightheadedness with hypotension: {Yes Has patient had a PCN reaction causing severe rash involving mucus membranes or skin necrosis: NO Has patient had a PCN reaction that required hospitalization {Yes Has patient had a PCN reaction occurring within the last 10 years: NO If all of the above answers are "NO", then may proceed with Cephalosporin use.     Labs:  Results for orders placed or performed during the hospital encounter of 09/02/15 (from the past 48 hour(s))  Troponin I     Status: None   Collection Time: 09/05/15  2:14 PM  Result Value Ref Range   Troponin I <0.03 <0.031 ng/mL    Comment:        NO INDICATION OF MYOCARDIAL INJURY.   CBC     Status: Abnormal   Collection Time: 09/06/15  2:40 AM  Result Value Ref Range   WBC 9.4 4.0 - 10.5 K/uL   RBC 3.93 (L) 4.22 - 5.81 MIL/uL   Hemoglobin 11.4 (L) 13.0 - 17.0 g/dL   HCT 36.8 (L) 39.0 - 52.0 %   MCV 93.6 78.0 - 100.0 fL   MCH 29.0 26.0 - 34.0 pg   MCHC 31.0 30.0 - 36.0 g/dL   RDW 15.5 11.5 - 15.5 %   Platelets 266 150 - 400 K/uL  Basic metabolic panel     Status: Abnormal   Collection Time: 09/06/15  2:40 AM  Result Value Ref Range   Sodium 138 135 - 145 mmol/L   Potassium 3.9 3.5 - 5.1 mmol/L    Chloride 103 101 - 111 mmol/L   CO2 27 22 - 32 mmol/L   Glucose, Bld 92 65 - 99 mg/dL   BUN 14 6 - 20 mg/dL   Creatinine, Ser 0.90 0.61 - 1.24 mg/dL  Calcium 8.4 (L) 8.9 - 10.3 mg/dL   GFR calc non Af Amer >60 >60 mL/min   GFR calc Af Amer >60 >60 mL/min    Comment: (NOTE) The eGFR has been calculated using the CKD EPI equation. This calculation has not been validated in all clinical situations. eGFR's persistently <60 mL/min signify possible Chronic Kidney Disease.    Anion gap 8 5 - 15    Current Facility-Administered Medications  Medication Dose Route Frequency Provider Last Rate Last Dose  . acetaminophen (TYLENOL) tablet 650 mg  650 mg Oral Q6H PRN Samella Parr, NP   650 mg at 09/07/15 0844   Or  . acetaminophen (TYLENOL) suppository 650 mg  650 mg Rectal Q6H PRN Samella Parr, NP      . albuterol (PROVENTIL) (2.5 MG/3ML) 0.083% nebulizer solution 2.5 mg  2.5 mg Nebulization Q4H PRN Nita Sells, MD      . alum & mag hydroxide-simeth (MAALOX/MYLANTA) 200-200-20 MG/5ML suspension 30 mL  30 mL Oral Q6H PRN Samella Parr, NP   30 mL at 09/03/15 2020  . cloNIDine (CATAPRES) tablet 0.2 mg  0.2 mg Oral TID Samella Parr, NP   0.2 mg at 09/07/15 0941  . diazepam (VALIUM) injection 5-10 mg  5-10 mg Intravenous Q1H PRN Samella Parr, NP   5 mg at 09/04/15 1128  . enoxaparin (LOVENOX) injection 40 mg  40 mg Subcutaneous Q24H Samella Parr, NP   40 mg at 09/06/15 1800  . [START ON 09/08/2015] feeding supplement (ENSURE ENLIVE) (ENSURE ENLIVE) liquid 237 mL  237 mL Oral Q24H Nita Sells, MD      . FLUoxetine (PROZAC) capsule 20 mg  20 mg Oral Daily Nita Sells, MD   20 mg at 09/07/15 0941  . folic acid (FOLVITE) tablet 1 mg  1 mg Oral Daily Assunta Found Stone, RPH   1 mg at 09/07/15 0941  . gabapentin (NEURONTIN) capsule 100 mg  100 mg Oral TID Corena Pilgrim, MD   100 mg at 09/07/15 0941  . gi cocktail (Maalox,Lidocaine,Donnatal)  30 mL Oral BID PRN  Nita Sells, MD   30 mL at 09/05/15 0808  . hydrALAZINE (APRESOLINE) injection 10 mg  10 mg Intravenous Q6H PRN Samella Parr, NP      . metoprolol tartrate (LOPRESSOR) tablet 25 mg  25 mg Oral QAC breakfast Nita Sells, MD   25 mg at 09/07/15 0843  . multivitamin with minerals tablet 1 tablet  1 tablet Oral Daily Nita Sells, MD   1 tablet at 09/07/15 0941  . nicotine (NICODERM CQ - dosed in mg/24 hours) patch 21 mg  21 mg Transdermal Daily Samella Parr, NP   21 mg at 09/07/15 0941  . nystatin (MYCOSTATIN) 100000 UNIT/ML suspension 500,000 Units  5 mL Oral QID Nita Sells, MD   500,000 Units at 09/07/15 0941  . pantoprazole (PROTONIX) EC tablet 80 mg  80 mg Oral Daily Nita Sells, MD   80 mg at 09/07/15 0941  . sodium chloride flush (NS) 0.9 % injection 3 mL  3 mL Intravenous Q12H Samella Parr, NP   3 mL at 09/07/15 0946  . thiamine (VITAMIN B-1) tablet 100 mg  100 mg Oral Daily Jarome Matin, MD   100 mg at 09/07/15 0941  . traZODone (DESYREL) tablet 100 mg  100 mg Oral QHS Corena Pilgrim, MD   100 mg at 09/06/15 2227    Musculoskeletal: Strength & Muscle Tone: within normal limits Gait &  Station: unsteady Patient leans: Engineer, technical sales Exam: Review of Systems  Constitutional: Positive for malaise/fatigue and diaphoresis.  HENT: Negative.   Eyes: Negative.   Respiratory: Negative.   Cardiovascular: Negative.   Gastrointestinal: Positive for nausea.  Genitourinary: Negative.   Musculoskeletal: Positive for myalgias.  Skin: Negative.   Neurological: Positive for tremors and weakness.  Endo/Heme/Allergies: Negative.   Psychiatric/Behavioral: Positive for depression and substance abuse. The patient is nervous/anxious and has insomnia.     Blood pressure 119/73, pulse 75, temperature 98 F (36.7 C), temperature source Oral, resp. rate 18, height 5' 4"  (1.626 m), weight 69.1 kg (152 lb 5.4 oz), SpO2 98 %.Body mass index is 26.14  kg/(m^2).  General Appearance: Disheveled  Eye Contact::  Good  Speech:  Clear and Coherent  Volume:  Normal  Mood:  Depressed and Dysphoric  Affect:  Constricted  Thought Process:  Negative  Orientation:  Full (Time, Place, and Person)  Thought Content:  Negative  Suicidal Thoughts:  No  Homicidal Thoughts:  No  Memory:  Immediate;   Fair Recent;   Good Remote;   Good  Judgement:  Impaired  Insight:  Shallow  Psychomotor Activity:  Decreased  Concentration:  Fair  Recall:  Good  Fund of Knowledge:Good  Language: Good  Akathisia:  No  Handed:  Right  AIMS (if indicated):     Assets:  Communication Skills Desire for Improvement  ADL's:  Intact  Cognition: WNL  Sleep:   poor   Treatment Plan Summary: Daily contact with patient to assess and evaluate symptoms and progress in treatment.   Medication management: Continue Prozac 59m daily for depression. Continue Alcohol detox protocol. Continue Trazodone to 1034mqhs for insomnia continue Gabapentin 10016mid for alcohol withdrawal syndrome.  Disposition: Patient does not meet criteria for psychiatric inpatient admission. Supportive therapy provided about ongoing stressors.  [facility] 09[facility]07/2015 11:58 AM

## 2015-09-07 NOTE — Progress Notes (Signed)
Pt discharged to [facility] on [address]. Leaving hospital via [facility] card. Pt is self paying for the taxi. Belongings collected from security and checked with patient before discharge. Everything accounted for. Discharge instructions provided with no concerns voiced

## 2015-09-07 NOTE — Progress Notes (Signed)
Pt going to [employer], does not know phone number to the hotel. Contact pulled from google, Address: [address] [place], [place] [address]  Phone: [phone].

## 2015-09-07 NOTE — Progress Notes (Signed)
Nutrition Follow-up  DOCUMENTATION CODES:   Not applicable  INTERVENTION:   -Continue MVI daily -Decrease Ensure Enlive to daily, due to improved PO's  NUTRITION DIAGNOSIS:   Inadequate oral intake related to social / environmental circumstances, poor appetite as evidenced by per patient/family report, energy intake < or equal to 50% for > or equal to 5 days.  Progressing  GOAL:   Patient will meet greater than or equal to 90% of their needs  Progressing  MONITOR:   PO intake, Supplement acceptance, Labs, Weight trends, Skin, I & O's  REASON FOR ASSESSMENT:   Malnutrition Screening Tool    ASSESSMENT:   60 year old male with a history of alcohol abuse, tobacco abuse, who presented to the emergency department with complaints of syncope. Patient has been drinking daily for the last 40 years, has been drinking at least 1 case of beer daily. He was on his way to the gas station to obtain cigarettes and beer, however then he had a syncopal episode.   Pt ambulating halls with safety sitter prior to visit.   Spoke with pt at bedside. He reports that his appetite has improved greatly since admission. Meal completion 50-100%. He reports he did well with his breakfast tray. Per sitter at bedside, pt consumed 100% of his tray.   Pt enjoys the Ensure Enlive supplements (chocolate flavor), however, complains of consuming 3 times daily, due to increased PO intake. RD will decrease frequency of supplements.   Spoke with RN, who reports pt with good appetite and medically stable for discharge once transfer to psychiatric facility have been arranged.   Labs reviewed.   Diet Order:  Diet Heart Room service appropriate?: Yes; Fluid consistency:: Thin Diet - low sodium heart healthy  Skin:  Reviewed, no issues  Last BM:  09/06/15  Height:   Ht Readings from Last 1 Encounters:  09/02/15 5\' 4"  (1.626 m)    Weight:   Wt Readings from Last 1 Encounters:  09/02/15 152 lb 5.4 oz  (69.1 kg)    Ideal Body Weight:  59.1 kg  BMI:  Body mass index is 26.14 kg/(m^2).  Estimated Nutritional Needs:   Kcal:  1900-2100  Protein:  80-90 grams  Fluid:  1.9-2.1 L/day  EDUCATION NEEDS:   No education needs identified at this time  Stephenson Cichy A. Jimmye Norman, RD, LDN, CDE Pager: [phone] After hours Pager: [phone]

## 2015-09-07 NOTE — Progress Notes (Signed)
Pt requested to speak to social worker regarding discharge plans. Call placed to covering social worker and message left for her to call unit.

## 2015-09-07 NOTE — Discharge Summary (Signed)
Physician Discharge Summary  Samuel Reyes [id number] DOB: 23-Jun-1956 DOA: 09/02/2015  PCP: Lorayne Marek, MD  Admit date: 09/02/2015 Discharge date: 09/07/2015  Time spent: 40 minutes  Recommendations for Outpatient Follow-up:  1. Needs management at Psychiatric facility for depression, suicidality 2. Complete cours eof Nystatin swish swallow for thrush  Discharge Diagnoses:  Principal Problem:   Alcohol dependence with uncomplicated withdrawal ([place]) Active Problems:   Hypertensive urgency   COPD (chronic obstructive pulmonary disease) (HCC)   Smoker   Alcohol dependence ([place])   Major depression, recurrent (HCC)   Major depressive disorder, recurrent, severe without psychotic features ([place])   Protein-calorie malnutrition, severe ([place])   Hepatic steatosis   Dehydration, severe   Alcohol withdrawal ([place])   Abnormal TSH   Alcohol withdrawal syndrome ([place])   Discharge Condition: fair  Diet recommendation:  Heart healthy  Filed Weights   09/02/15 0919 09/02/15 1700  Weight: 68.04 kg (150 lb) 69.1 kg (152 lb 5.4 oz)    History of present illness:  60 y/o ? Chronic ethanol abuse 5X 40 ounces of beer on most days Known depression on multiple medications Prior suicidality 2016 last admitted 05/2015 , 02/2015 also has had intentional overdose Esophagitis secondary to nonsteroidals and gastrectomy in the past for PUD  Smoker with known COPD  Prior CVA    Admitted to [facility] with syncope Admission Chem-7 reassuring, LFTs not elevated, hemoglobin 15 but dropped to 11 after IV fluid repletion CIWA 3 CXR benign  Hospital Course:   1. Acute ethanol withdrawal- CIWA has been below 8. patient has been reasonably detoxed per protocol and can continue care at Short term psychiatric facility 2. Chest pain-transient and related to GERD most probably.  EKG no changes and troponin done wasn't elevated 3. Major depression-resume home doses of trazodone 50 daily at  bedtime as well as Prozac 20 daily-- psychiatry was increased his trazodone to 100 mg on 09/03/15 , gabapentin continued to help with alcohol withdrawal 100 3 times a day- on d/c [wasn;t taking gabepentin prior to admit although had been prescribed]. Await further psyche input-Social worker contacted for placement for dual diagnosis-withdrawal and mdd 4. Sinus tachycardia resolved after restart metoprolol 25 daily. Can continue clonidine 0.2 x 3 tid 5. Esophagitis with known gastrectomy in the past for PUD-continue Protonix twice a day 6. Prior CVA-hold on aspirin for now and resume on discharge 7. Dilutional anemia-likely secondary to IV fluid. Repeat cbc showed stability 8. Thrush-given nystatin on d/c home 9. Probable COPD in chronic smoker who still smokes-added albuterol every 4 when necessary-may continue as OP   Consultations:  pscyhiatry  Discharge Exam: Filed Vitals:   09/06/15 2227 09/07/15 0545  BP: 103/55 119/73  Pulse:  75  Temp:  98 F (36.7 C)  Resp:  18   No overt complaitns General: eomi ncat, some thrush noted on tongue Cardiovascular:  s1 s2 no m/r/g Respiratory:  Clear no added sound   Discharge Instructions   Discharge Instructions    Diet - low sodium heart healthy    Complete by:  As directed      Increase activity slowly    Complete by:  As directed           Current Discharge Medication List    START taking these medications   Details  cloNIDine (CATAPRES) 0.2 MG tablet Take 1 tablet (0.2 mg total) by mouth 3 (three) times daily. Qty: 60 tablet, Refills: 11    nystatin (MYCOSTATIN) 100000 UNIT/ML suspension  Take 5 mLs (500,000 Units total) by mouth 4 (four) times daily. Qty: 60 mL, Refills: 0      CONTINUE these medications which have CHANGED   Details  gabapentin (NEURONTIN) 100 MG capsule Take 1 capsule (100 mg total) by mouth 3 (three) times daily. Qty: 90 capsule, Refills: 0    traZODone (DESYREL) 100 MG tablet Take 1 tablet (100 mg  total) by mouth at bedtime.      CONTINUE these medications which have NOT CHANGED   Details  Aspirin-Acetaminophen-Caffeine (GOODY HEADACHE PO) Take 1 packet by mouth every 8 (eight) hours as needed (pain).    albuterol (PROVENTIL HFA;VENTOLIN HFA) 108 (90 BASE) MCG/ACT inhaler Inhale 1-2 puffs into the lungs every 4 (four) hours as needed for wheezing. Qty: 1 Inhaler, Refills: 0    FLUoxetine (PROZAC) 20 MG capsule Take 1 capsule (20 mg total) by mouth daily. Qty: 30 capsule, Refills: 0    Ipratropium-Albuterol (COMBIVENT) 20-100 MCG/ACT AERS respimat Inhale 1 puff into the lungs every 6 (six) hours as needed for wheezing. Qty: 1 Inhaler, Refills: 0    metoprolol tartrate (LOPRESSOR) 25 MG tablet Take 1 tablet (25 mg total) by mouth daily before breakfast. Qty: 30 tablet, Refills: 0    Multiple Vitamin (MULTIVITAMIN WITH MINERALS) TABS tablet Take 1 tablet by mouth daily. Qty: 30 tablet, Refills: 0    naproxen (NAPROSYN) 500 MG tablet Take 1 tablet (500 mg total) by mouth 2 (two) times daily with a meal. Qty: 30 tablet, Refills: 0    pantoprazole (PROTONIX) 40 MG tablet Take 2 tablets (80 mg total) by mouth daily. Qty: 30 tablet, Refills: 0      STOP taking these medications     clindamycin (CLEOCIN) 300 MG capsule        Allergies  Allergen Reactions  . Bee Venom Anaphylaxis  . Penicillins Rash    Has patient had a PCN reaction causing immediate rash, facial/tongue/throat swelling, SOB or lightheadedness with hypotension: {Yes Has patient had a PCN reaction causing severe rash involving mucus membranes or skin necrosis: NO Has patient had a PCN reaction that required hospitalization {Yes Has patient had a PCN reaction occurring within the last 10 years: NO If all of the above answers are "NO", then may proceed with Cephalosporin use.       The results of significant diagnostics from this hospitalization (including imaging, microbiology, ancillary and laboratory)  are listed below for reference.    Significant Diagnostic Studies: Dg Chest 2 View  09/02/2015  CLINICAL DATA:  Tachycardia.  COPD. EXAM: CHEST  2 VIEW COMPARISON:  Chest radiograph 06/15/2015. FINDINGS: Multiple monitoring leads overlie the patient. Stable cardiac and mediastinal contours. No consolidative pulmonary opacities. No pleural effusion or pneumothorax. Thoracic spine is unremarkable. IMPRESSION: No acute cardiopulmonary process. Electronically Signed   By: Lovey Newcomer M.D.   On: 09/02/2015 17:49    Microbiology: Recent Results (from the past 240 hour(s))  MRSA PCR Screening     Status: None   Collection Time: 09/02/15  5:02 PM  Result Value Ref Range Status   MRSA by PCR NEGATIVE NEGATIVE Final    Comment:        The GeneXpert MRSA Assay (FDA approved for NASAL specimens only), is one component of a comprehensive MRSA colonization surveillance program. It is not intended to diagnose MRSA infection nor to guide or monitor treatment for MRSA infections.   Urine culture     Status: None   Collection Time: 09/02/15  6:05  PM  Result Value Ref Range Status   Specimen Description URINE, RANDOM  Final   Special Requests NONE  Final   Culture 1,000 COLONIES/mL INSIGNIFICANT GROWTH  Final   Report Status 09/04/2015 FINAL  Final     Labs: Basic Metabolic Panel:  Recent Labs Lab 09/02/15 0918 09/02/15 1521 09/03/15 0230 09/05/15 0333 09/06/15 0240  NA 141  --  137 140 138  K 4.8  --  3.7 4.1 3.9  CL 103  --  106 105 103  CO2 22  --  21* 28 27  GLUCOSE 99  --  91 97 92  BUN 9  --  9 8 14   CREATININE 0.99  --  0.81 0.85 0.90  CALCIUM 8.6*  --  7.7* 8.3* 8.4*  MG  --  1.5*  --   --   --   PHOS  --  3.4  --   --   --    Liver Function Tests:  Recent Labs Lab 09/02/15 0918 09/03/15 0230  AST 35 19  ALT 19 12*  ALKPHOS 87 65  BILITOT 1.0 0.8  PROT 6.8 4.9*  ALBUMIN 3.5 2.4*   No results for input(s): LIPASE, AMYLASE in the last 168 hours. No results for  input(s): AMMONIA in the last 168 hours. CBC:  Recent Labs Lab 09/02/15 0918 09/03/15 0230 09/06/15 0240  WBC 8.8 5.8 9.4  NEUTROABS 6.2  --   --   HGB 15.4 11.3* 11.4*  HCT 45.1 34.4* 36.8*  MCV 91.1 90.8 93.6  PLT 367 276 266   Cardiac Enzymes:  Recent Labs Lab 09/05/15 1414  TROPONINI <0.03   BNP: BNP (last 3 results)  Recent Labs  12/10/14 1339 01/04/15 1432 06/15/15 1325  BNP 46.5 20.9 41.3    ProBNP (last 3 results) No results for input(s): PROBNP in the last 8760 hours.  CBG:  Recent Labs Lab 09/02/15 0931  GLUCAP 74       Signed:  Nita Sells MD   [facility] 09/07/2015, 11:05 AM

## 2015-09-07 NOTE — Progress Notes (Signed)
CSW provided pt with SA resource list.  Creta Levin, LCSW Evening/ED Coverage [id number]

## 2015-09-17 ENCOUNTER — Inpatient Hospital Stay (HOSPITAL_COMMUNITY)
Admission: EM | Admit: 2015-09-17 | Discharge: 2015-09-21 | DRG: 391 | Disposition: A | Discharge status: Home / Self Care | Payer: Medicaid Other | Attending: Internal Medicine | Admitting: Internal Medicine

## 2015-09-17 ENCOUNTER — Encounter (HOSPITAL_COMMUNITY): Payer: Self-pay | Admitting: Emergency Medicine

## 2015-09-17 DIAGNOSIS — K2270 Barrett's esophagus without dysplasia: Secondary | ICD-10-CM | POA: Diagnosis present

## 2015-09-17 DIAGNOSIS — J9601 Acute respiratory failure with hypoxia: Secondary | ICD-10-CM | POA: Diagnosis present

## 2015-09-17 DIAGNOSIS — I10 Essential (primary) hypertension: Secondary | ICD-10-CM | POA: Diagnosis present

## 2015-09-17 DIAGNOSIS — Z8711 Personal history of peptic ulcer disease: Secondary | ICD-10-CM

## 2015-09-17 DIAGNOSIS — Y908 Blood alcohol level of 240 mg/100 ml or more: Secondary | ICD-10-CM | POA: Diagnosis present

## 2015-09-17 DIAGNOSIS — R0600 Dyspnea, unspecified: Secondary | ICD-10-CM

## 2015-09-17 DIAGNOSIS — E876 Hypokalemia: Secondary | ICD-10-CM | POA: Diagnosis present

## 2015-09-17 DIAGNOSIS — I4581 Long QT syndrome: Secondary | ICD-10-CM | POA: Diagnosis present

## 2015-09-17 DIAGNOSIS — J449 Chronic obstructive pulmonary disease, unspecified: Secondary | ICD-10-CM | POA: Diagnosis not present

## 2015-09-17 DIAGNOSIS — K219 Gastro-esophageal reflux disease without esophagitis: Secondary | ICD-10-CM | POA: Diagnosis present

## 2015-09-17 DIAGNOSIS — Z8673 Personal history of transient ischemic attack (TIA), and cerebral infarction without residual deficits: Secondary | ICD-10-CM

## 2015-09-17 DIAGNOSIS — IMO0001 Reserved for inherently not codable concepts without codable children: Secondary | ICD-10-CM

## 2015-09-17 DIAGNOSIS — Z825 Family history of asthma and other chronic lower respiratory diseases: Secondary | ICD-10-CM

## 2015-09-17 DIAGNOSIS — M1990 Unspecified osteoarthritis, unspecified site: Secondary | ICD-10-CM | POA: Diagnosis present

## 2015-09-17 DIAGNOSIS — E871 Hypo-osmolality and hyponatremia: Secondary | ICD-10-CM | POA: Diagnosis present

## 2015-09-17 DIAGNOSIS — R45851 Suicidal ideations: Secondary | ICD-10-CM | POA: Diagnosis present

## 2015-09-17 DIAGNOSIS — F329 Major depressive disorder, single episode, unspecified: Secondary | ICD-10-CM | POA: Diagnosis present

## 2015-09-17 DIAGNOSIS — F10129 Alcohol abuse with intoxication, unspecified: Secondary | ICD-10-CM | POA: Diagnosis present

## 2015-09-17 DIAGNOSIS — Z801 Family history of malignant neoplasm of trachea, bronchus and lung: Secondary | ICD-10-CM

## 2015-09-17 DIAGNOSIS — Z91030 Bee allergy status: Secondary | ICD-10-CM | POA: Diagnosis not present

## 2015-09-17 DIAGNOSIS — E869 Volume depletion, unspecified: Secondary | ICD-10-CM | POA: Diagnosis present

## 2015-09-17 DIAGNOSIS — Z79899 Other long term (current) drug therapy: Secondary | ICD-10-CM

## 2015-09-17 DIAGNOSIS — Z808 Family history of malignant neoplasm of other organs or systems: Secondary | ICD-10-CM

## 2015-09-17 DIAGNOSIS — J441 Chronic obstructive pulmonary disease with (acute) exacerbation: Secondary | ICD-10-CM | POA: Diagnosis present

## 2015-09-17 DIAGNOSIS — I341 Nonrheumatic mitral (valve) prolapse: Secondary | ICD-10-CM | POA: Diagnosis present

## 2015-09-17 DIAGNOSIS — F10120 Alcohol abuse with intoxication, uncomplicated: Secondary | ICD-10-CM | POA: Diagnosis not present

## 2015-09-17 DIAGNOSIS — K920 Hematemesis: Secondary | ICD-10-CM | POA: Diagnosis present

## 2015-09-17 DIAGNOSIS — K7460 Unspecified cirrhosis of liver: Secondary | ICD-10-CM | POA: Diagnosis present

## 2015-09-17 DIAGNOSIS — F1020 Alcohol dependence, uncomplicated: Secondary | ICD-10-CM | POA: Diagnosis not present

## 2015-09-17 DIAGNOSIS — Z880 Allergy status to penicillin: Secondary | ICD-10-CM | POA: Diagnosis not present

## 2015-09-17 DIAGNOSIS — R000 Tachycardia, unspecified: Secondary | ICD-10-CM | POA: Diagnosis present

## 2015-09-17 DIAGNOSIS — F10929 Alcohol use, unspecified with intoxication, unspecified: Secondary | ICD-10-CM | POA: Diagnosis present

## 2015-09-17 DIAGNOSIS — Z823 Family history of stroke: Secondary | ICD-10-CM | POA: Diagnosis not present

## 2015-09-17 DIAGNOSIS — K2920 Alcoholic gastritis without bleeding: Secondary | ICD-10-CM | POA: Diagnosis present

## 2015-09-17 DIAGNOSIS — F17210 Nicotine dependence, cigarettes, uncomplicated: Secondary | ICD-10-CM | POA: Diagnosis present

## 2015-09-17 DIAGNOSIS — F419 Anxiety disorder, unspecified: Secondary | ICD-10-CM | POA: Diagnosis present

## 2015-09-17 DIAGNOSIS — J45909 Unspecified asthma, uncomplicated: Secondary | ICD-10-CM | POA: Diagnosis present

## 2015-09-17 DIAGNOSIS — F331 Major depressive disorder, recurrent, moderate: Secondary | ICD-10-CM | POA: Diagnosis not present

## 2015-09-17 DIAGNOSIS — F10229 Alcohol dependence with intoxication, unspecified: Secondary | ICD-10-CM | POA: Diagnosis present

## 2015-09-17 DIAGNOSIS — F32A Depression, unspecified: Secondary | ICD-10-CM | POA: Diagnosis present

## 2015-09-17 LAB — COMPREHENSIVE METABOLIC PANEL
ALBUMIN: 4 g/dL (ref 3.5–5.0)
ALK PHOS: 89 U/L (ref 38–126)
ALK PHOS: 85 U/L (ref 38–126)
ALT: 36 U/L (ref 17–63)
ALT: 31 U/L (ref 17–63)
ANION GAP: 19 — AB (ref 5–15)
AST: 51 U/L — AB (ref 15–41)
AST: 47 U/L — ABNORMAL HIGH (ref 15–41)
Albumin: 3.7 g/dL (ref 3.5–5.0)
Anion gap: 21 — ABNORMAL HIGH (ref 5–15)
BILIRUBIN TOTAL: 0.3 mg/dL (ref 0.3–1.2)
BILIRUBIN TOTAL: 0.5 mg/dL (ref 0.3–1.2)
BUN: 9 mg/dL (ref 6–20)
BUN: 8 mg/dL (ref 6–20)
CALCIUM: 7.9 mg/dL — AB (ref 8.9–10.3)
CO2: 29 mmol/L (ref 22–32)
CO2: 31 mmol/L (ref 22–32)
CREATININE: 0.78 mg/dL (ref 0.61–1.24)
Calcium: 8.5 mg/dL — ABNORMAL LOW (ref 8.9–10.3)
Chloride: 71 mmol/L — ABNORMAL LOW (ref 101–111)
Chloride: 76 mmol/L — ABNORMAL LOW (ref 101–111)
Creatinine, Ser: 0.73 mg/dL (ref 0.61–1.24)
GFR calc Af Amer: >60 mL/min (ref >60)
GLUCOSE: 93 mg/dL (ref 65–99)
Glucose, Bld: 68 mg/dL (ref 65–99)
POTASSIUM: 2.8 mmol/L — AB (ref 3.5–5.1)
POTASSIUM: 2.7 mmol/L — AB (ref 3.5–5.1)
Sodium: 121 mmol/L — ABNORMAL LOW (ref 135–145)
Sodium: 126 mmol/L — ABNORMAL LOW (ref 135–145)
TOTAL PROTEIN: 7.7 g/dL (ref 6.5–8.1)
TOTAL PROTEIN: 7 g/dL (ref 6.5–8.1)

## 2015-09-17 LAB — URINALYSIS, ROUTINE W REFLEX MICROSCOPIC
BILIRUBIN URINE: NEGATIVE
GLUCOSE, UA: NEGATIVE mg/dL
Hgb urine dipstick: NEGATIVE
KETONES UR: NEGATIVE mg/dL
Leukocytes, UA: NEGATIVE
NITRITE: NEGATIVE
PH: 5.5 (ref 5.0–8.0)
Protein, ur: NEGATIVE mg/dL
Specific Gravity, Urine: 1.008 (ref 1.005–1.030)

## 2015-09-17 LAB — APTT
APTT: 27 s (ref 24–37)
aPTT: 27 seconds (ref 24–37)

## 2015-09-17 LAB — CBC WITH DIFFERENTIAL/PLATELET
BASOS ABS: 0 10*3/uL (ref 0.0–0.1)
BASOS ABS: 0 10*3/uL (ref 0.0–0.1)
BASOS PCT: 0 %
Basophils Relative: 0 %
EOS ABS: 0 10*3/uL (ref 0.0–0.7)
EOS PCT: 0 %
Eosinophils Absolute: 0 10*3/uL (ref 0.0–0.7)
Eosinophils Relative: 0 %
HCT: 42.2 % (ref 39.0–52.0)
HEMATOCRIT: 40 % (ref 39.0–52.0)
HEMOGLOBIN: 13.8 g/dL (ref 13.0–17.0)
Hemoglobin: 14.6 g/dL (ref 13.0–17.0)
LYMPHS ABS: 1.2 10*3/uL (ref 0.7–4.0)
LYMPHS PCT: 12 %
Lymphocytes Relative: 9 %
Lymphs Abs: 0.9 10*3/uL (ref 0.7–4.0)
MCH: 29.5 pg (ref 26.0–34.0)
MCH: 29.9 pg (ref 26.0–34.0)
MCHC: 34.6 g/dL (ref 30.0–36.0)
MCHC: 34.5 g/dL (ref 30.0–36.0)
MCV: 85.3 fL (ref 78.0–100.0)
MCV: 86.6 fL (ref 78.0–100.0)
MONO ABS: 1.6 10*3/uL — AB (ref 0.1–1.0)
MONOS PCT: 17 %
MONOS PCT: 7 %
Monocytes Absolute: 0.7 10*3/uL (ref 0.1–1.0)
NEUTROS ABS: 8.4 10*3/uL — AB (ref 1.7–7.7)
Neutro Abs: 7.2 10*3/uL (ref 1.7–7.7)
Neutrophils Relative %: 74 %
Neutrophils Relative %: 81 %
PLATELETS: 278 10*3/uL (ref 150–400)
Platelets: 281 10*3/uL (ref 150–400)
RBC: 4.95 MIL/uL (ref 4.22–5.81)
RBC: 4.62 MIL/uL (ref 4.22–5.81)
RDW: 14.6 % (ref 11.5–15.5)
RDW: 14.9 % (ref 11.5–15.5)
WBC: 9.7 10*3/uL (ref 4.0–10.5)
WBC: 10.3 10*3/uL (ref 4.0–10.5)

## 2015-09-17 LAB — SALICYLATE LEVEL: Salicylate Lvl: 5.9 mg/dL (ref 2.8–30.0)

## 2015-09-17 LAB — RAPID URINE DRUG SCREEN, HOSP PERFORMED
AMPHETAMINES: NOT DETECTED
BARBITURATES: NOT DETECTED
Benzodiazepines: NOT DETECTED
Cocaine: NOT DETECTED
Opiates: NOT DETECTED
TETRAHYDROCANNABINOL: NOT DETECTED

## 2015-09-17 LAB — TYPE AND SCREEN
ABO/RH(D): O POS
Antibody Screen: NEGATIVE

## 2015-09-17 LAB — ACETAMINOPHEN LEVEL

## 2015-09-17 LAB — OCCULT BLOOD GASTRIC / DUODENUM (SPECIMEN CUP)
Occult Blood, Gastric: POSITIVE — AB
PH, GASTRIC: 3

## 2015-09-17 LAB — ABO/RH: ABO/RH(D): O POS

## 2015-09-17 LAB — PHOSPHORUS: PHOSPHORUS: 2.9 mg/dL (ref 2.5–4.6)

## 2015-09-17 LAB — TSH: TSH: 0.24 u[IU]/mL — AB (ref 0.350–4.500)

## 2015-09-17 LAB — ETHANOL: ALCOHOL ETHYL (B): 287 mg/dL — AB (ref <5)

## 2015-09-17 LAB — PROTIME-INR
INR: 0.88 (ref 0.00–1.49)
INR: 0.88 (ref 0.00–1.49)
PROTHROMBIN TIME: 12.1 s (ref 11.6–15.2)
Prothrombin Time: 12.2 seconds (ref 11.6–15.2)

## 2015-09-17 LAB — MAGNESIUM: MAGNESIUM: 2.1 mg/dL (ref 1.7–2.4)

## 2015-09-17 LAB — LIPASE, BLOOD: Lipase: 29 U/L (ref 11–51)

## 2015-09-17 MED ORDER — ONDANSETRON HCL 4 MG PO TABS: 4.0000 mg | ORAL_TABLET | Freq: Four times a day (QID) | ORAL | Status: DC | PRN

## 2015-09-17 MED ORDER — PANTOPRAZOLE SODIUM 40 MG IV SOLR
40.0000 mg | Freq: Two times a day (BID) | INTRAVENOUS | Status: DC
  Filled 2015-09-17: qty 40

## 2015-09-17 MED ORDER — ONDANSETRON HCL 4 MG/2ML IJ SOLN
4.0000 mg | Freq: Once | INTRAMUSCULAR | Status: AC
  Administered 2015-09-17: 4 mg via INTRAVENOUS
  Filled 2015-09-17: qty 2

## 2015-09-17 MED ORDER — THIAMINE HCL 100 MG/ML IJ SOLN: 100.0000 mg | Freq: Every day | INTRAMUSCULAR | Status: DC

## 2015-09-17 MED ORDER — ACETAMINOPHEN 650 MG RE SUPP
650.0000 mg | Freq: Four times a day (QID) | RECTAL | Status: DC | PRN
  Administered 2015-09-17: 650 mg via RECTAL
  Filled 2015-09-17: qty 1

## 2015-09-17 MED ORDER — SODIUM CHLORIDE 0.9 % IV SOLN
8.0000 mg/h | INTRAVENOUS | Status: DC
  Administered 2015-09-17: 8 mg/h via INTRAVENOUS
  Filled 2015-09-17: qty 80

## 2015-09-17 MED ORDER — LORAZEPAM 2 MG/ML IJ SOLN
0.0000 mg | Freq: Four times a day (QID) | INTRAMUSCULAR | Status: DC
  Administered 2015-09-17: 2 mg via INTRAVENOUS

## 2015-09-17 MED ORDER — FLUOXETINE HCL 20 MG PO CAPS
20.0000 mg | ORAL_CAPSULE | Freq: Every day | ORAL | Status: DC
Start: 2015-09-17 — End: 2015-09-22
  Administered 2015-09-17 – 2015-09-21 (×5): 20 mg via ORAL
  Filled 2015-09-17 (×5): qty 1

## 2015-09-17 MED ORDER — IPRATROPIUM-ALBUTEROL 20-100 MCG/ACT IN AERS: 1.0000 | INHALATION_SPRAY | Freq: Four times a day (QID) | RESPIRATORY_TRACT | Status: DC | PRN

## 2015-09-17 MED ORDER — LORAZEPAM 1 MG PO TABS: 0.0000 mg | ORAL_TABLET | Freq: Two times a day (BID) | ORAL | Status: DC

## 2015-09-17 MED ORDER — PANTOPRAZOLE SODIUM 40 MG IV SOLR: 40.0000 mg | Freq: Two times a day (BID) | INTRAVENOUS | Status: DC

## 2015-09-17 MED ORDER — IPRATROPIUM-ALBUTEROL 0.5-2.5 (3) MG/3ML IN SOLN: 3.0000 mL | Freq: Four times a day (QID) | RESPIRATORY_TRACT | Status: DC | PRN

## 2015-09-17 MED ORDER — THIAMINE HCL 100 MG/ML IJ SOLN
Freq: Once | INTRAVENOUS | Status: AC
  Administered 2015-09-17: 15:00:00 via INTRAVENOUS
  Filled 2015-09-17: qty 1000

## 2015-09-17 MED ORDER — METOPROLOL TARTRATE 25 MG PO TABS
25.0000 mg | ORAL_TABLET | Freq: Every day | ORAL | Status: DC
  Administered 2015-09-18 – 2015-09-21 (×4): 25 mg via ORAL
  Filled 2015-09-17 (×6): qty 1

## 2015-09-17 MED ORDER — SODIUM CHLORIDE 0.9 % IV SOLN: INTRAVENOUS | Status: DC

## 2015-09-17 MED ORDER — NYSTATIN 100000 UNIT/ML MT SUSP
5.0000 mL | Freq: Four times a day (QID) | OROMUCOSAL | Status: DC
  Administered 2015-09-17 – 2015-09-21 (×15): 500000 [IU] via ORAL
  Filled 2015-09-17 (×17): qty 5

## 2015-09-17 MED ORDER — VITAMIN B-1 100 MG PO TABS: 100.0000 mg | ORAL_TABLET | Freq: Every day | ORAL | Status: DC

## 2015-09-17 MED ORDER — GABAPENTIN 100 MG PO CAPS
100.0000 mg | ORAL_CAPSULE | Freq: Three times a day (TID) | ORAL | Status: DC
  Administered 2015-09-17 – 2015-09-21 (×12): 100 mg via ORAL
  Filled 2015-09-17 (×13): qty 1

## 2015-09-17 MED ORDER — ONDANSETRON HCL 4 MG/2ML IJ SOLN
INTRAMUSCULAR | Status: AC
  Administered 2015-09-17: 16:00:00
  Filled 2015-09-17: qty 2

## 2015-09-17 MED ORDER — ONDANSETRON HCL 4 MG/2ML IJ SOLN
4.0000 mg | Freq: Four times a day (QID) | INTRAMUSCULAR | Status: DC | PRN
  Administered 2015-09-17 – 2015-09-19 (×2): 4 mg via INTRAVENOUS
  Filled 2015-09-17 (×2): qty 2

## 2015-09-17 MED ORDER — LORAZEPAM 1 MG PO TABS: 0.0000 mg | ORAL_TABLET | Freq: Four times a day (QID) | ORAL | Status: DC

## 2015-09-17 MED ORDER — POTASSIUM CHLORIDE 10 MEQ/100ML IV SOLN
10.0000 meq | INTRAVENOUS | Status: AC
  Administered 2015-09-17 (×3): 10 meq via INTRAVENOUS
  Filled 2015-09-17 (×2): qty 100

## 2015-09-17 MED ORDER — ALBUTEROL SULFATE (2.5 MG/3ML) 0.083% IN NEBU: 2.5000 mg | INHALATION_SOLUTION | RESPIRATORY_TRACT | Status: DC | PRN

## 2015-09-17 MED ORDER — SODIUM CHLORIDE 0.9% FLUSH
3.0000 mL | Freq: Two times a day (BID) | INTRAVENOUS | Status: DC
  Administered 2015-09-18 – 2015-09-20 (×4): 3 mL via INTRAVENOUS

## 2015-09-17 MED ORDER — SODIUM CHLORIDE 0.9 % IV SOLN
80.0000 mg | Freq: Once | INTRAVENOUS | Status: AC
  Administered 2015-09-17: 80 mg via INTRAVENOUS
  Filled 2015-09-17: qty 80

## 2015-09-17 MED ORDER — ACETAMINOPHEN 325 MG PO TABS
650.0000 mg | ORAL_TABLET | Freq: Four times a day (QID) | ORAL | Status: DC | PRN
  Administered 2015-09-18 – 2015-09-21 (×4): 650 mg via ORAL
  Filled 2015-09-17 (×4): qty 2

## 2015-09-17 MED ORDER — TRAZODONE HCL 100 MG PO TABS
100.0000 mg | ORAL_TABLET | Freq: Every day | ORAL | Status: DC
  Administered 2015-09-17 – 2015-09-20 (×4): 100 mg via ORAL
  Filled 2015-09-17 (×5): qty 1

## 2015-09-17 MED ORDER — CLONIDINE HCL 0.2 MG PO TABS
0.2000 mg | ORAL_TABLET | Freq: Three times a day (TID) | ORAL | Status: DC
Start: 2015-09-17 — End: 2015-09-19
  Administered 2015-09-17 – 2015-09-18 (×4): 0.2 mg via ORAL
  Filled 2015-09-17 (×7): qty 1

## 2015-09-17 MED ORDER — LORAZEPAM 2 MG/ML IJ SOLN
0.0000 mg | Freq: Two times a day (BID) | INTRAMUSCULAR | Status: DC
  Filled 2015-09-17: qty 1

## 2015-09-17 NOTE — ED Notes (Signed)
Pt arrived via EMS with report of telling police that he wanted to kill himself. Pt stated, "I just want to die" and "I'm tired of living". Pt reported drinking alcohol x2 weeks with last drink of 4-40oz today. Pt reported that he does not have a plan.

## 2015-09-17 NOTE — Progress Notes (Signed)
Pt. Arrived to floor from ED via stretcher. Alert and oriented x 4.No respiratory distress noted. Follow commands, appears pleasant. MD notified of arrival to floor.

## 2015-09-17 NOTE — ED Notes (Signed)
Delay in blood draw, pt changing clothes

## 2015-09-17 NOTE — H&P (Signed)
[facility] History and Physical  Samuel Reyes [id number] DOB: 1955-12-21 DOA: 09/17/2015  Referring physician: ER physician: Dr. Charlesetta Shanks  PCP: Lorayne Marek, MD  Chief Complaint: alcohol intoxication   HPI:  60 year old male with past medical history of alcohol abuse, dependence, hypertension who presented to [facility] ED and apparently wanted to kill himself. He is not a good historian due to acute alcohol intoxication. He had an episode of coffee ground emesis in ED and says he does not feel good. No respiratory distress. No fevers. No chest pain. No palpitations. No shortness of breath. No reports of diarrhea or blood in stool. He says he drinks all the time. No seizures and no loss of consciousness.   In ED, BP was 131/52m, HR 123, T max 99.9 F, oxygen saturation 93% on room air. Blood work showed sodium 121, potassium 2.8 (supplemented). Normal UA, normal UDS. Alcohol wasv 287. Started  On CIWA protocol. He had coffee ground emesis and was started on protonix drip and protonix 40 mg IV Q 12 hours. His hemoglobin is WNL.  Assessment & Plan    Principal Problem:   Alcohol intoxication ([place]) - Alcohol 287  - UDS WNL - Started CIWA protocol - Continue IV fluids, multivitamin, thiamine, folic acid - Monitor for withdrawals or seizures - Monitor on telemetry   Active Problems:   COPD (chronic obstructive pulmonary disease) (HCC) - Stable - Not in exacerbation - Resume albuterol inhaler     Essential hypertension - Continue clonidine and metoprolol     GERD (gastroesophageal reflux disease) / Coffee ground emesis - Started on protonix drip and protonix 40 mg IV Q 12 hours - Now stable after he has gotten Zofran - Consult GI in am    Suicidal ideation - Consult psych in am  - Sitter at the bedside     Depression - Resume Paxil    Benign essential HTN    Hypokalemia / Hyponatremia - Due to alcohol intoxication - Started IV fluids - Potassium  supplemented - Check Magnesium level - Repeat BMP in am   DVT prophylaxis:  - SCD's due to risk of bleeding   Radiological Exams on Admission: No results found.   Code Status: Full Family Communication: Plan of care discussed with the patient; no family at the bedside  Disposition Plan: Admit for further evaluation, telemetry due to tachycardia   Leisa Lenz, MD  [facility] Pager [phone]  Time spent in minutes: 75 minutes  Review of Systems:  Constitutional: Negative for fever, chills and malaise/fatigue. Negative for diaphoresis.  HENT: Negative for hearing loss, ear pain, nosebleeds, congestion, sore throat, neck pain, tinnitus and ear discharge.   Eyes: Negative for blurred vision, double vision, photophobia, pain, discharge and redness.  Respiratory: Negative for cough, hemoptysis, sputum production, shortness of breath, wheezing and stridor.   Cardiovascular: Negative for chest pain, palpitations, orthopnea, claudication and leg swelling.  Gastrointestinal: per HPI  Genitourinary: Negative for dysuria, urgency, frequency, hematuria and flank pain.  Musculoskeletal: Negative for myalgias, back pain, joint pain and falls.  Skin: Negative for itching and rash.  Neurological: Negative for dizziness and weakness. Negative for tingling, tremors, sensory change, speech change, focal weakness, loss of consciousness and headaches.  Endo/Heme/Allergies: Negative for environmental allergies and polydipsia. Does not bruise/bleed easily.  Psychiatric/Behavioral: Negative for suicidal ideas. The patient is not nervous/anxious.      Past Medical History  Diagnosis Date  . Cirrhosis ([place])   . Degenerative joint disease   .  Hypertension   . Gastric ulcer   . Depression   . Asthma   . Mitral valve prolapse 2002  . H/O hiatal hernia   . COPD (chronic obstructive pulmonary disease) ([place])   . TIA (transient ischemic attack)     2010  . Stroke [facility])     TIA - 2010 - no  deficits   . Shortness of breath     with exertion   . GERD (gastroesophageal reflux disease)   . Headache(784.0)   . Hiatal hernia 1982  . Degenerative joint disease   . Anxiety   . Alcohol abuse    Past Surgical History  Procedure Laterality Date  . Gastrectomy    . Shoulder surgery Bilateral     3 surgeries on on left, 2 surgeries on right   . Rt knee arthroscopic surgery    . Back surgery      3 cervical spine surgeries C4-C5 fused  . Hernia repair    . Finger surgery Left     2nd, 3rd, & 4th fingers were cut off by table saw and reattached  . Colonoscopy N/A 01/04/2014    Procedure: COLONOSCOPY;  Surgeon: Danie Binder, MD;  Location: [facility] ENDO SUITE;  Service: Endoscopy;  Laterality: N/A;  1:45  . Esophagogastroduodenoscopy N/A 01/04/2014    Procedure: ESOPHAGOGASTRODUODENOSCOPY (EGD);  Surgeon: Danie Binder, MD;  Location: [facility] ENDO SUITE;  Service: Endoscopy;  Laterality: N/A;  . Incisional hernia repair N/A 01/20/2014    Procedure: LAPAROSCOPIC RECURRENT  INCISIONAL HERNIA with mesh;  Surgeon: Edward Jolly, MD;  Location: [facility] ORS;  Service: General;  Laterality: N/A;   Social History:  reports that he has been smoking Cigarettes.  He started smoking about 49 years ago. He has a 45 pack-year smoking history. He has never used smokeless tobacco. He reports that he drinks alcohol. He reports that he does not use illicit drugs.  Allergies  Allergen Reactions  . Bee Venom Anaphylaxis  . Penicillins Rash    Has patient had a PCN reaction causing immediate rash, facial/tongue/throat swelling, SOB or lightheadedness with hypotension: {Yes Has patient had a PCN reaction causing severe rash involving mucus membranes or skin necrosis: NO Has patient had a PCN reaction that required hospitalization {Yes Has patient had a PCN reaction occurring within the last 10 years: NO If all of the above answers are "NO", then may proceed with Cephalosporin use.     Family History:  Family  History  Problem Relation Age of Onset  . Cancer Father     bone  . Cancer Brother     lungs  . Stroke Maternal Grandmother   . Colon cancer Neg Hx   . Asthma Son     died at age 2 in his sleep   . Spina bifida Son     died at age 76      Prior to Admission medications   Medication Sig Start Date End Date Taking? Authorizing Provider  albuterol (PROVENTIL HFA;VENTOLIN HFA) 108 (90 BASE) MCG/ACT inhaler Inhale 1-2 puffs into the lungs every 4 (four) hours as needed for wheezing. 06/15/15  Yes Larene Pickett, PA-C  Aspirin-Acetaminophen-Caffeine (GOODY HEADACHE PO) Take 1 packet by mouth every 8 (eight) hours as needed (pain).   Yes Historical Provider, MD  cloNIDine (CATAPRES) 0.2 MG tablet Take 1 tablet (0.2 mg total) by mouth 3 (three) times daily. 09/06/15  Yes Nita Sells, MD  FLUoxetine (PROZAC) 20 MG capsule Take 1 capsule (20  mg total) by mouth daily. 06/04/15  Yes Derrill Center, NP  gabapentin (NEURONTIN) 100 MG capsule Take 1 capsule (100 mg total) by mouth 3 (three) times daily. 09/06/15  Yes Nita Sells, MD  Ipratropium-Albuterol (COMBIVENT) 20-100 MCG/ACT AERS respimat Inhale 1 puff into the lungs every 6 (six) hours as needed for wheezing. 06/04/15  Yes Derrill Center, NP  metoprolol tartrate (LOPRESSOR) 25 MG tablet Take 1 tablet (25 mg total) by mouth daily before breakfast. 06/04/15  Yes Derrill Center, NP  nystatin (MYCOSTATIN) 100000 UNIT/ML suspension Take 5 mLs (500,000 Units total) by mouth 4 (four) times daily. 09/07/15  Yes Nita Sells, MD  pantoprazole (PROTONIX) 40 MG tablet Take 2 tablets (80 mg total) by mouth daily. 06/04/15  Yes Derrill Center, NP  traZODone (DESYREL) 100 MG tablet Take 1 tablet (100 mg total) by mouth at bedtime. 09/06/15  Yes Nita Sells, MD   Physical Exam: Filed Vitals:   09/17/15 1243 09/17/15 1418 09/17/15 1809  BP: 126/85 134/78 131/72  Pulse: 108 99 123  Temp: 97.7 F (36.5 C)  99.9 F (37.7 C)  TempSrc:  Oral  Oral  Resp: 15 20 24   SpO2: 92% 92% 93%    Physical Exam  Constitutional: Intoxicated, well noursihed. No distress.  HENT: Normocephalic. No tonsillar erythema or exudates Eyes: Conjunctivae are normal. No scleral icterus.  Neck: Normal ROM. Neck supple. No JVD. No tracheal deviation. No thyromegaly.  CVS: RRR, S1/S2 +, no murmurs, no gallops, no carotid bruit.  Pulmonary: Effort and breath sounds normal, no stridor, rhonchi, wheezes, rales.  Abdominal: Soft. BS +,  no distension, tenderness, rebound or guarding.  Musculoskeletal: Normal range of motion. No edema and no tenderness.  Lymphadenopathy: No lymphadenopathy noted, cervical, inguinal. Neuro: Alert. Normal reflexes, muscle tone coordination. No focal neurologic deficits. Skin: Skin is warm and dry.   Psychiatric: Normal mood and affect. Behavior normal.   Labs on Admission:  Basic Metabolic Panel:  Recent Labs Lab 09/17/15 1348  NA 121*  K 2.8*  CL 71*  CO2 29  GLUCOSE 93  BUN 9  CREATININE 0.78  CALCIUM 8.5*   Liver Function Tests:  Recent Labs Lab 09/17/15 1348  AST 51*  ALT 36  ALKPHOS 89  BILITOT 0.3  PROT 7.7  ALBUMIN 4.0    Recent Labs Lab 09/17/15 1348  LIPASE 29   No results for input(s): AMMONIA in the last 168 hours. CBC:  Recent Labs Lab 09/17/15 1525  WBC 9.7  NEUTROABS 7.2  HGB 14.6  HCT 42.2  MCV 85.3  PLT 278   Cardiac Enzymes: No results for input(s): CKTOTAL, CKMB, CKMBINDEX, TROPONINI in the last 168 hours. BNP: Invalid input(s): POCBNP CBG: No results for input(s): GLUCAP in the last 168 hours.  If 7PM-7AM, please contact night-coverage [email or web address] Password [facility] 09/17/2015, 6:10 PM

## 2015-09-17 NOTE — ED Notes (Signed)
UNABLE TO COLLECT LABS AT THIS TIME PATIENT IS IN THE BATHROOM. 

## 2015-09-17 NOTE — Progress Notes (Addendum)
Pt was brought back from the main ER. He stated,'I don't want to be here anymore. " Pt does have a sitter at the bedside. Pt has coffee ground emesis on a towel. Phoned EDP to make him aware. IV infusing at 150cc /hr via left arm 2o gauge. Pt does appear dishelved. Abd tender to palpation on the left upper and lower qaudrant. Spoke to EDP , Dr. Eulis Foster who will tell Dr Fransico Michael. (3:10pm)pt admits he has been vomitting up dark brown since yesterday. Pt given 2mg  of ativan for detox. He was instructed to turn on his side should he have to vomit. Phoned EDPconcerning the pts potassium. Per Dr. Eulis Foster do an EKG. 3:30p- EKG done. Blood work drawn and sent -CBC, PT/PTT. Pt will be placed on a heart monitor. EDP aware of pts potassium level 2.8. Gastric emesis sent to the lab be [facility],. Pt remains stable at this time. (3:40pm)Pt given 4 mg of zofran IV  fro dry heaves.

## 2015-09-17 NOTE — ED Notes (Signed)
Pt undress with following clothing items: shirt, jacket, socks, shoes and pants placed in belongings bag, wanded via security and pt wanded.

## 2015-09-17 NOTE — ED Notes (Signed)
Bed: [facility] Expected date:  Expected time:  Means of arrival:  Comments: EMS etoh  

## 2015-09-17 NOTE — ED Provider Notes (Signed)
CSN: [id number]     Arrival date & time 09/17/15  1217 History   First MD Initiated Contact with Patient 09/17/15 1229     Chief Complaint  Patient presents with  . Suicidal  . Alcohol Intoxication     (Consider location/radiation/quality/duration/timing/severity/associated sxs/prior Treatment) HPI Patient reports that he is here because he drinks all the time and he feels like he might kill himself. He is a very poor historian. He reports drinking just prior to coming to the emergency department. He has had some vomiting. He does not specify how much. He does not specify a plan for suicide. Reviewed EMR indicates the patient was admitted several weeks ago with suicidal ideation and alcohol withdrawal. Past Medical History  Diagnosis Date  . Cirrhosis ([place])   . Degenerative joint disease   . Hypertension   . Gastric ulcer   . Depression   . Asthma   . Mitral valve prolapse 2002  . H/O hiatal hernia   . COPD (chronic obstructive pulmonary disease) ([place])   . TIA (transient ischemic attack)     2010  . Stroke [facility])     TIA - 2010 - no deficits   . Shortness of breath     with exertion   . GERD (gastroesophageal reflux disease)   . Headache(784.0)   . Hiatal hernia 1982  . Degenerative joint disease   . Anxiety   . Alcohol abuse    Past Surgical History  Procedure Laterality Date  . Gastrectomy    . Shoulder surgery Bilateral     3 surgeries on on left, 2 surgeries on right   . Rt knee arthroscopic surgery    . Back surgery      3 cervical spine surgeries C4-C5 fused  . Hernia repair    . Finger surgery Left     2nd, 3rd, & 4th fingers were cut off by table saw and reattached  . Colonoscopy N/A 01/04/2014    Procedure: COLONOSCOPY;  Surgeon: Danie Binder, MD;  [facility];  Service: Endoscopy;  Laterality: N/A;  1:45  . Esophagogastroduodenoscopy N/A 01/04/2014    Procedure: ESOPHAGOGASTRODUODENOSCOPY (EGD);  Surgeon: Danie Binder, MD;  Location[facility];  Service: Endoscopy;  Laterality: N/A;  . Incisional hernia repair N/A 01/20/2014    Procedure: LAPAROSCOPIC RECURRENT  INCISIONAL HERNIA with mesh;  Surgeon: Edward Jolly, MD;  Location: [facility];  Service: General;  Laterality: N/A;   Family History  Problem Relation Age of Onset  . Cancer Father     bone  . Cancer Brother     lungs  . Stroke Maternal Grandmother   . Colon cancer Neg Hx   . Asthma Son     died at age 75 in his sleep   . Spina bifida Son     died at age 3    Social History  Substance Use Topics  . Smoking status: Current Every Day Smoker -- 1.00 packs/day for 45 years    Types: Cigarettes    Start date: 07/30/1966  . Smokeless tobacco: Never Used  . Alcohol Use: Yes     Comment: 4-40oz today    Review of Systems Cannot obtain full review of systems Level V caveat alcohol intoxication.   Allergies  Bee venom and Penicillins  Home Medications   Prior to Admission medications   Medication Sig Start Date End Date Taking? Authorizing Provider  albuterol (PROVENTIL HFA;VENTOLIN HFA) 108 (90 BASE) MCG/ACT inhaler Inhale  1-2 puffs into the lungs every 4 (four) hours as needed for wheezing. 06/15/15  Yes Larene Pickett, PA-C  Aspirin-Acetaminophen-Caffeine (GOODY HEADACHE PO) Take 1 packet by mouth every 8 (eight) hours as needed (pain).   Yes Historical Provider, MD  cloNIDine (CATAPRES) 0.2 MG tablet Take 1 tablet (0.2 mg total) by mouth 3 (three) times daily. 09/06/15  Yes Nita Sells, MD  FLUoxetine (PROZAC) 20 MG capsule Take 1 capsule (20 mg total) by mouth daily. 06/04/15  Yes Derrill Center, NP  gabapentin (NEURONTIN) 100 MG capsule Take 1 capsule (100 mg total) by mouth 3 (three) times daily. 09/06/15  Yes Nita Sells, MD  Ipratropium-Albuterol (COMBIVENT) 20-100 MCG/ACT AERS respimat Inhale 1 puff into the lungs every 6 (six) hours as needed for wheezing. 06/04/15  Yes Derrill Center, NP  metoprolol tartrate (LOPRESSOR) 25 MG tablet  Take 1 tablet (25 mg total) by mouth daily before breakfast. 06/04/15  Yes Derrill Center, NP  nystatin (MYCOSTATIN) 100000 UNIT/ML suspension Take 5 mLs (500,000 Units total) by mouth 4 (four) times daily. 09/07/15  Yes Nita Sells, MD  pantoprazole (PROTONIX) 40 MG tablet Take 2 tablets (80 mg total) by mouth daily. 06/04/15  Yes Derrill Center, NP  traZODone (DESYREL) 100 MG tablet Take 1 tablet (100 mg total) by mouth at bedtime. 09/06/15  Yes Nita Sells, MD   BP 134/78 mmHg  Pulse 99  Temp(Src) 97.7 F (36.5 C) (Oral)  Resp 20  SpO2 92% Physical Exam  Constitutional:  Patient smells strongly of alcohol. He is lying in clothes soaked with urine. He answers simple questions. Patient does not have respiratory distress.  HENT:  Head: Normocephalic and atraumatic.  Right Ear: External ear normal.  Left Ear: External ear normal.  Nose: Nose normal.  Patient has a slimy brown tinged mucous in his beard. He appears to have vomited and there is some on the front of his shirt.  Eyes: EOM are normal.  Sclerae are injected.  Neck: Neck supple.  Cardiovascular: Normal rate, regular rhythm, normal heart sounds and intact distal pulses.   Pulmonary/Chest: Effort normal. No respiratory distress.  Intermittent wheeze.  Abdominal: Soft. Bowel sounds are normal. He exhibits no distension. There is no tenderness.  Musculoskeletal:  The patient is spontaneously moving all 4 extremities to reposition himself. There is no focal deformity or focal deficit.  Neurological:  Patient appears severely intoxicated. He has predominately sleeping but awakens to vigorous shaking. His speech is slurred. He answers simple questions appropriately. Patient spontaneously uses all 4 extremities.  Skin: Skin is warm and dry.    ED Course  Procedures (including critical care time) Labs Review Labs Reviewed  COMPREHENSIVE METABOLIC PANEL - Abnormal; Notable for the following:    Sodium 121 (*)     Potassium 2.8 (*)    Chloride 71 (*)    Calcium 8.5 (*)    AST 51 (*)    Anion gap 21 (*)    All other components within normal limits  ETHANOL - Abnormal; Notable for the following:    Alcohol, Ethyl (B) 287 (*)    All other components within normal limits  ACETAMINOPHEN LEVEL - Abnormal; Notable for the following:    Acetaminophen (Tylenol), Serum <10 (*)    All other components within normal limits  CBC WITH DIFFERENTIAL/PLATELET - Abnormal; Notable for the following:    Monocytes Absolute 1.6 (*)    All other components within normal limits  OCCULT BLOOD GASTRIC / DUODENUM (  SPECIMEN CUP) - Abnormal; Notable for the following:    Occult Blood, Gastric POSITIVE (*)    All other components within normal limits  LIPASE, BLOOD  SALICYLATE LEVEL  URINE RAPID DRUG SCREEN, HOSP PERFORMED  PROTIME-INR  APTT  URINALYSIS, ROUTINE W REFLEX MICROSCOPIC (NOT AT [facility])  POCT GASTRIC OCCULT BLOOD (1-CARD TO LAB)  TYPE AND SCREEN    Imaging Review No results found. I have personally reviewed and evaluated these images and lab results as part of my medical decision-making.   EKG Interpretation   Date/Time:  Saturday September 17 2015 15:43:56 EST Ventricular Rate:  103 PR Interval:  130 QRS Duration: 96 QT Interval:  404 QTC Calculation: 529 R Axis:   65 Text Interpretation:  Sinus tachycardia Possible Left atrial enlargement  Prolonged QT Abnormal ECG no STEMI. no change from old Confirmed by  Johnney Killian, MD, Jeannie Done [phone]) on 09/17/2015 3:52:19 PM      MDM   Final diagnoses:  Alcoholism /alcohol abuse ([place])  Hematemesis without nausea  Hyponatremia   Patient presents very intoxicated. He had stated suicide ideation without plan. Lying in the emergency department he has vomited small amounts of brown tinged mucus that appears to be coffee-ground in nature. Patient is extremely disheveled. At this time he will be admitted for vomiting with acute alcohol intoxication. He is also  significantly hyponatremic and hypokalemic. He has multiple complications of chronic alcohol abuse as well as being acutely intoxicated.    Charlesetta Shanks, MD 09/17/15 [phone]

## 2015-09-17 NOTE — ED Notes (Addendum)
Noted coffee brown emesis. Pt ambulated with assist to RM 31.

## 2015-09-18 DIAGNOSIS — J9601 Acute respiratory failure with hypoxia: Secondary | ICD-10-CM

## 2015-09-18 DIAGNOSIS — F331 Major depressive disorder, recurrent, moderate: Secondary | ICD-10-CM

## 2015-09-18 DIAGNOSIS — IMO0001 Reserved for inherently not codable concepts without codable children: Secondary | ICD-10-CM | POA: Insufficient documentation

## 2015-09-18 DIAGNOSIS — F10129 Alcohol abuse with intoxication, unspecified: Secondary | ICD-10-CM

## 2015-09-18 DIAGNOSIS — E871 Hypo-osmolality and hyponatremia: Secondary | ICD-10-CM

## 2015-09-18 DIAGNOSIS — E876 Hypokalemia: Secondary | ICD-10-CM

## 2015-09-18 DIAGNOSIS — F1020 Alcohol dependence, uncomplicated: Secondary | ICD-10-CM | POA: Insufficient documentation

## 2015-09-18 DIAGNOSIS — R45851 Suicidal ideations: Secondary | ICD-10-CM

## 2015-09-18 DIAGNOSIS — K920 Hematemesis: Secondary | ICD-10-CM

## 2015-09-18 LAB — CBC
HEMATOCRIT: 36.6 % — AB (ref 39.0–52.0)
Hemoglobin: 12.8 g/dL — ABNORMAL LOW (ref 13.0–17.0)
MCH: 30.5 pg (ref 26.0–34.0)
MCHC: 35 g/dL (ref 30.0–36.0)
MCV: 87.1 fL (ref 78.0–100.0)
PLATELETS: 226 10*3/uL (ref 150–400)
RBC: 4.2 MIL/uL — AB (ref 4.22–5.81)
RDW: 15.2 % (ref 11.5–15.5)
WBC: 11.2 10*3/uL — ABNORMAL HIGH (ref 4.0–10.5)

## 2015-09-18 LAB — BASIC METABOLIC PANEL
Anion gap: 11 (ref 5–15)
BUN: 11 mg/dL (ref 6–20)
CHLORIDE: 82 mmol/L — AB (ref 101–111)
CO2: 33 mmol/L — AB (ref 22–32)
CREATININE: 0.85 mg/dL (ref 0.61–1.24)
Calcium: 7.8 mg/dL — ABNORMAL LOW (ref 8.9–10.3)
GFR calc non Af Amer: >60 mL/min (ref >60)
Glucose, Bld: 86 mg/dL (ref 65–99)
Potassium: 3.3 mmol/L — ABNORMAL LOW (ref 3.5–5.1)
Sodium: 126 mmol/L — ABNORMAL LOW (ref 135–145)

## 2015-09-18 LAB — MAGNESIUM: Magnesium: 2 mg/dL (ref 1.7–2.4)

## 2015-09-18 LAB — GLUCOSE, CAPILLARY: Glucose-Capillary: 88 mg/dL (ref 65–99)

## 2015-09-18 MED ORDER — IPRATROPIUM-ALBUTEROL 0.5-2.5 (3) MG/3ML IN SOLN
3.0000 mL | Freq: Four times a day (QID) | RESPIRATORY_TRACT | Status: DC
  Administered 2015-09-18 (×2): 3 mL via RESPIRATORY_TRACT
  Filled 2015-09-18: qty 3

## 2015-09-18 MED ORDER — SODIUM CHLORIDE 0.9 % IV SOLN
INTRAVENOUS | Status: DC
  Administered 2015-09-18 – 2015-09-20 (×3): via INTRAVENOUS
  Filled 2015-09-18 (×5): qty 1000

## 2015-09-18 MED ORDER — PREDNISONE 50 MG PO TABS
50.0000 mg | ORAL_TABLET | Freq: Every day | ORAL | Status: DC
  Administered 2015-09-19 – 2015-09-21 (×3): 50 mg via ORAL
  Filled 2015-09-18 (×5): qty 1

## 2015-09-18 MED ORDER — IPRATROPIUM-ALBUTEROL 0.5-2.5 (3) MG/3ML IN SOLN
3.0000 mL | Freq: Three times a day (TID) | RESPIRATORY_TRACT | Status: DC
  Administered 2015-09-18 – 2015-09-19 (×4): 3 mL via RESPIRATORY_TRACT
  Filled 2015-09-18 (×4): qty 3

## 2015-09-18 NOTE — Progress Notes (Signed)
PROGRESS NOTE  Samuel Reyes [id number] DOB: January 22, 1956 DOA: 09/17/2015 PCP: Lorayne Marek, MD Brief history 60 year old male with a history of Barrett's esophagus, alcohol abuse, hypertension, COPD presents to the emergency department with coffee grounds emesis and intoxicated. He was suicidal in the emergency department  He was noted to have coffee grounds emesis during his stay in the emergency department. Alcohol level was 287. Hemoglobin and blood pressures have been stable. The patient was started on intravenous Protonix. Psychiatry and GI have been consulted. Assessment/Plan: Hematemesis -pt with hx of Barrett's seen on EGD on 01/04/14 and Bilroth I bypass -secondary to Etoh and NSAIDS -continue Protonix IV -Hemoglobin remains stable, hemodynamically stable -Clear liquids -I have consulted [facility]--spoke with Dr. Penelope Coop Acute respiratory failure with hypoxia/COPD exacerbation -oxygen saturation 88% on room air with increasing shortness of breath -Start duo nebs -Start prednisone -Pulmonary hygiene -stable on 2L Suicidal ideation -continue sitter -psychiatry consult--I have called this am Alcohol intoxication with history of withdrawal -Last alcoholic beverage [id number] -Drinks 2 x 40oz beers daily -CIWA Hyponatremia -Secondary to volume depletion -Continue IV fluids Hypokalemia -replete -check mag Hypertension -Continue metoprolol tartrate -continue clonidine Depression -Continue Prozac and trazodone Tobacco abuse -no desire to quit   Family Communication:   Pt at beside Disposition Plan:   Home when medically stable        Procedures/Studies: Dg Chest 2 View  09/02/2015  CLINICAL DATA:  Tachycardia.  COPD. EXAM: CHEST  2 VIEW COMPARISON:  Chest radiograph 06/15/2015. FINDINGS: Multiple monitoring leads overlie the patient. Stable cardiac and mediastinal contours. No consolidative pulmonary opacities. No pleural effusion or pneumothorax.  Thoracic spine is unremarkable. IMPRESSION: No acute cardiopulmonary process. Electronically Signed   By: Lovey Newcomer M.D.   On: 09/02/2015 17:49         Subjective: Patient complains of some nausea without emesis. Denies any fevers, chills, chest pain, vomiting, diarrhea, abdominal pain. He is short of breath.  Objective: Filed Vitals:   09/17/15 2154 09/18/15 0016 09/18/15 0445 09/18/15 0531  BP: 130/65 118/90 119/65   Pulse: 117 113 114   Temp: 99.4 F (37.4 C) 98.9 F (37.2 C) 100.3 F (37.9 C)   TempSrc: Oral Oral Oral   Resp: 20 20 18    SpO2: 88% 88% 88% 97%   No intake or output data in the 24 hours ending 09/18/15 0856 Weight change:  Exam:   General:  Pt is alert, follows commands appropriately, not in acute distress  HEENT: No icterus, No thrush, No neck mass, [place]/AT  Cardiovascular: RRR, S1/S2, no rubs, no gallops  Respiratory: bilateral expiratory wheeze.  Abdomen: Soft/+BS, non tender, non distended, no guarding; no hepatosplenomegaly  Extremities: No edema, No lymphangitis, No petechiae, No rashes, no synovitis; clubbing without cyanosis  Data Reviewed: Basic Metabolic Panel:  Recent Labs Lab 09/17/15 1348 09/17/15 1925 09/18/15 0629  NA 121* 126* 126*  K 2.8* 2.7* 3.3*  CL 71* 76* 82*  CO2 29 31 33*  GLUCOSE 93 68 86  BUN 9 8 11   CREATININE 0.78 0.73 0.85  CALCIUM 8.5* 7.9* 7.8*  MG  --  2.1 2.0  PHOS  --  2.9  --    Liver Function Tests:  Recent Labs Lab 09/17/15 1348 09/17/15 1925  AST 51* 47*  ALT 36 31  ALKPHOS 89 85  BILITOT 0.3 0.5  PROT 7.7 7.0  ALBUMIN 4.0 3.7    Recent Labs Lab 09/17/15 1348  LIPASE  29   No results for input(s): AMMONIA in the last 168 hours. CBC:  Recent Labs Lab 09/17/15 1525 09/17/15 2020 09/18/15 0629  WBC 9.7 10.3 11.2*  NEUTROABS 7.2 8.4*  --   HGB 14.6 13.8 12.8*  HCT 42.2 40.0 36.6*  MCV 85.3 86.6 87.1  PLT 278 281 226   Cardiac Enzymes: No results for input(s): CKTOTAL,  CKMB, CKMBINDEX, TROPONINI in the last 168 hours. BNP: Invalid input(s): POCBNP CBG:  Recent Labs Lab 09/18/15 0743  GLUCAP 88    No results found for this or any previous visit (from the past 240 hour(s)).   Scheduled Meds: . sodium chloride   Intravenous STAT  . cloNIDine  0.2 mg Oral TID  . FLUoxetine  20 mg Oral Daily  . gabapentin  100 mg Oral TID  . metoprolol tartrate  25 mg Oral QAC breakfast  . nystatin  5 mL Oral QID  . [START ON 09/20/2015] pantoprazole (PROTONIX) IV  40 mg Intravenous Q12H  . sodium chloride flush  3 mL Intravenous Q12H  . traZODone  100 mg Oral QHS   Continuous Infusions: . sodium chloride       Tabbetha Kutscher, DO  [facility] Pager [phone]  If 7PM-7AM, please contact night-coverage [email or web address] Password [email or web address] 09/18/2015, 8:56 AM   LOS: 1 day

## 2015-09-18 NOTE — Consult Note (Signed)
Subjective:   HPI  The patient is a 60 year old alcoholic who presented to the emergency room last night. He was acutely intoxicated with alcohol. His alcohol level was 287. He had an episode of coffee-ground emesis in the emergency room. Vital signs remained stable. He has not vomited further inserting admitted. He had an endoscopy a couple of years ago by Dr. Oneida Alar and no varices were seen. There was question of Barrett's esophagus but biopsies for this were negative. He denies abdominal pain or further vomiting at this time. He does have a history of peptic ulcer disease and has had a Billroth I. Hemoglobin is 12.8.  Review of Systems No chest pain or shortness of breath  History reviewed. No pertinent past medical history. Past Surgical History  Procedure Laterality Date  . Gastrectomy    . Shoulder surgery Bilateral     3 surgeries on on left, 2 surgeries on right   . Rt knee arthroscopic surgery    . Back surgery      3 cervical spine surgeries C4-C5 fused  . Hernia repair    . Finger surgery Left     2nd, 3rd, & 4th fingers were cut off by table saw and reattached  . Colonoscopy N/A 01/04/2014    Procedure: COLONOSCOPY;  Surgeon: Danie Binder, MD;  [facility];  Service: Endoscopy;  Laterality: N/A;  1:45  . Esophagogastroduodenoscopy N/A 01/04/2014    Procedure: ESOPHAGOGASTRODUODENOSCOPY (EGD);  Surgeon: Danie Binder, MD;  [facility];  Service: Endoscopy;  Laterality: N/A;  . Incisional hernia repair N/A 01/20/2014    Procedure: LAPAROSCOPIC RECURRENT  INCISIONAL HERNIA with mesh;  Surgeon: Edward Jolly, MD;  Location: [facility];  Service: General;  Laterality: N/A;   Social History   Social History  . Marital Status: Widowed    Spouse Name: N/A  . Number of Children: N/A  . Years of Education: N/A   Occupational History  . Disability    Social History Main Topics  . Smoking status: Current Every Day Smoker -- 1.00 packs/day for 45 years    Types: Cigarettes    Start date: 07/30/1966  . Smokeless tobacco: Never Used  . Alcohol Use: Yes     Comment: 4-40oz today  . Drug Use: No     Comment: denid using any drugs  . Sexual Activity: Not on file   Other Topics Concern  . Not on file   Social History Narrative   family history includes Asthma in his son; Cancer in his brother and father; Spina bifida in his son; Stroke in his maternal grandmother. There is no history of Colon cancer.  Current facility-administered medications:  .  acetaminophen (TYLENOL) tablet 650 mg, 650 mg, Oral, Q6H PRN **OR** acetaminophen (TYLENOL) suppository 650 mg, 650 mg, Rectal, Q6H PRN, Robbie Lis, MD, 650 mg at 09/17/15 1903 .  albuterol (PROVENTIL) (2.5 MG/3ML) 0.083% nebulizer solution 2.5 mg, 2.5 mg, Inhalation, Q4H PRN, Robbie Lis, MD .  cloNIDine (CATAPRES) tablet 0.2 mg, 0.2 mg, Oral, TID, Robbie Lis, MD, 0.2 mg at 09/18/15 0932 .  FLUoxetine (PROZAC) capsule 20 mg, 20 mg, Oral, Daily, Robbie Lis, MD, 20 mg at 09/18/15 0932 .  gabapentin (NEURONTIN) capsule 100 mg, 100 mg, Oral, TID, Robbie Lis, MD, 100 mg at 09/18/15 0933 .  ipratropium-albuterol (DUONEB) 0.5-2.5 (3) MG/3ML nebulizer solution 3 mL, 3 mL, Nebulization, Q6H, Orson Eva, MD, 3 mL at 09/18/15 1036 .  metoprolol  tartrate (LOPRESSOR) tablet 25 mg, 25 mg, Oral, QAC breakfast, Robbie Lis, MD, 25 mg at 09/18/15 0836 .  nystatin (MYCOSTATIN) 100000 UNIT/ML suspension 500,000 Units, 5 mL, Oral, QID, Robbie Lis, MD, 500,000 Units at 09/18/15 0933 .  ondansetron (ZOFRAN) tablet 4 mg, 4 mg, Oral, Q6H PRN **OR** ondansetron (ZOFRAN) injection 4 mg, 4 mg, Intravenous, Q6H PRN, Robbie Lis, MD, 4 mg at 09/17/15 2300 .  [START ON 09/20/2015] pantoprazole (PROTONIX) injection 40 mg, 40 mg, Intravenous, Q12H, Robbie Lis, MD .  Derrill Memo ON 09/19/2015] predniSONE (DELTASONE) tablet 50 mg, 50 mg, Oral, Q breakfast, Shanon Brow Tat, MD .  sodium chloride 0.9 % 1,000 mL with potassium  chloride 20 mEq infusion, , Intravenous, Continuous, Orson Eva, MD, Last Rate: 75 mL/hr at 09/18/15 1002 .  sodium chloride flush (NS) 0.9 % injection 3 mL, 3 mL, Intravenous, Q12H, Robbie Lis, MD, 3 mL at 09/17/15 2200 .  traZODone (DESYREL) tablet 100 mg, 100 mg, Oral, QHS, Robbie Lis, MD, 100 mg at 09/17/15 2241 Allergies  Allergen Reactions  . Bee Venom Anaphylaxis  . Penicillins Rash    Has patient had a PCN reaction causing immediate rash, facial/tongue/throat swelling, SOB or lightheadedness with hypotension: {Yes Has patient had a PCN reaction causing severe rash involving mucus membranes or skin necrosis: NO Has patient had a PCN reaction that required hospitalization {Yes Has patient had a PCN reaction occurring within the last 10 years: NO If all of the above answers are "NO", then may proceed with Cephalosporin use.      Objective:     BP 119/65 mmHg  Pulse 114  Temp(Src) 100.3 F (37.9 C) (Oral)  Resp 18  SpO2 98%  He is in no distress  Nonicteric  Heart regular rhythm no murmurs  Lungs clear  Abdomen: Soft and nontender Lab Results  Component Value Date   HGB 12.8* 09/18/2015   HGB 13.8 09/17/2015   HGB 14.6 09/17/2015   HCT 36.6* 09/18/2015   HCT 40.0 09/17/2015   HCT 42.2 09/17/2015   ALKPHOS 85 09/17/2015   ALKPHOS 89 09/17/2015   ALKPHOS 65 09/03/2015   AST 47* 09/17/2015   AST 51* 09/17/2015   AST 19 09/03/2015   ALT 31 09/17/2015   ALT 36 09/17/2015   ALT 12* 09/03/2015     Laboratory No components found for: D1    Assessment:     Alcoholic  Coffee-ground emesis  Has remained hemodynamically stable        Plan:     I would recommend continuing supportive care with PPI therapy. Watch for any evidence of DTs. I suspect that his coffee-ground emesis was secondary to alcohol gastritis. I do not think we need to pursue endoscopy at this time.

## 2015-09-18 NOTE — Consult Note (Signed)
[facility] Consult   Reason for Consult:  Alcohol intoxication and alcohol induced depression and suicidal ideation Referring Physician:  Dr. Carles Collet Patient Identification: Samuel Reyes MRN:  [id number] Principal Diagnosis: Alcohol intoxication [facility]) Diagnosis:   Patient Active Problem List   Diagnosis Date Noted  . Acute respiratory failure with hypoxia ([place]) [J96.01] 09/18/2015  . Alcoholism /alcohol abuse ([place]) [F10.20]   . Suicidal ideation [R45.851] 09/17/2015  . Alcohol intoxication ([place]) [F10.129] 09/17/2015  . Depression [F32.9] 09/17/2015  . Benign essential HTN [I10] 09/17/2015  . Hypokalemia [E87.6] 09/17/2015  . Hyponatremia [E87.1] 09/17/2015  . Coffee ground emesis [K92.0] 09/17/2015  . COPD (chronic obstructive pulmonary disease) ([place]) [J44.9] 02/17/2013  . GERD (gastroesophageal reflux disease) [K21.9] 02/17/2013    Total Time spent with patient: 1 hour  Subjective:   Samuel Reyes is a 60 y.o. male patient admitted with  alcohol intoxication and suicidal ideation  History of present illness:  Samuel Reyes is a 60 year old male seen for face to face psych consultation. Patient stated that he has been depressed but denied being suicide and contract for safety. Patient has multiple psychosocial stresses and no place to live since his step dad kicked him out of home due to increased drinking and smoking all day long. He has been improving from his alcohol intoxication, withdrawal from his current medication regimen. No seizures and no loss of consciousness. He is willing to take his medication and follow up with out patient treatment. He refuses to go to residential rehab treatment and was involved in [facility] in the past and [facility]. He contract for safety.  Past Psychiatric History: Multiple admission to behavioral health for alcohol detox treatment and depression  Risk to Self: Is patient at risk for suicide?: Yes Risk to Others:   Prior Inpatient  Therapy:   Prior Outpatient Therapy:    Past Medical History: History reviewed. No pertinent past medical history.  Past Surgical History  Procedure Laterality Date  . Gastrectomy    . Shoulder surgery Bilateral     3 surgeries on on left, 2 surgeries on right   . Rt knee arthroscopic surgery    . Back surgery      3 cervical spine surgeries C4-C5 fused  . Hernia repair    . Finger surgery Left     2nd, 3rd, & 4th fingers were cut off by table saw and reattached  . Colonoscopy N/A 01/04/2014    Procedure: COLONOSCOPY;  Surgeon: Danie Binder, MD;  Location: [facility];  Service: Endoscopy;  Laterality: N/A;  1:45  . Esophagogastroduodenoscopy N/A 01/04/2014    Procedure: ESOPHAGOGASTRODUODENOSCOPY (EGD);  Surgeon: Danie Binder, MD;  Location: [facility];  Service: Endoscopy;  Laterality: N/A;  . Incisional hernia repair N/A 01/20/2014    Procedure: LAPAROSCOPIC RECURRENT  INCISIONAL HERNIA with mesh;  Surgeon: Edward Jolly, MD;  Location: [facility] ORS;  Service: General;  Laterality: N/A;   Family History:  Family History  Problem Relation Age of Onset  . Cancer Father     bone  . Cancer Brother     lungs  . Stroke Maternal Grandmother   . Colon cancer Neg Hx   . Asthma Son     died at age 56 in his sleep   . Spina bifida Son     died at age 51    Family Psychiatric  History:  Unknown Social History:  History  Alcohol Use  . Yes    Comment:  4-40oz today     History  Drug Use No    Comment: denid using any drugs    Social History   Social History  . Marital Status: Widowed    Spouse Name: N/A  . Number of Children: N/A  . Years of Education: N/A   Occupational History  . Disability    Social History Main Topics  . Smoking status: Current Every Day Smoker -- 1.00 packs/day for 45 years    Types: Cigarettes    Start date: 07/30/1966  . Smokeless tobacco: Never Used  . Alcohol Use: Yes     Comment: 4-40oz today  . Drug Use: No     Comment: denid using  any drugs  . Sexual Activity: Not Asked   Other Topics Concern  . None   Social History Narrative   Additional Social History:    Allergies:   Allergies  Allergen Reactions  . Bee Venom Anaphylaxis  . Penicillins Rash    Has patient had a PCN reaction causing immediate rash, facial/tongue/throat swelling, SOB or lightheadedness with hypotension: {Yes Has patient had a PCN reaction causing severe rash involving mucus membranes or skin necrosis: NO Has patient had a PCN reaction that required hospitalization {Yes Has patient had a PCN reaction occurring within the last 10 years: NO If all of the above answers are "NO", then may proceed with Cephalosporin use.     Labs:  Results for orders placed or performed during the hospital encounter of 09/17/15 (from the past 48 hour(s))  Comprehensive metabolic panel     Status: Abnormal   Collection Time: 09/17/15  1:48 PM  Result Value Ref Range   Sodium 121 (L) 135 - 145 mmol/L    Comment: RESULT REPEATED AND VERIFIED   Potassium 2.8 (L) 3.5 - 5.1 mmol/L    Comment: RESULT REPEATED AND VERIFIED   Chloride 71 (L) 101 - 111 mmol/L    Comment: RESULT REPEATED AND VERIFIED   CO2 29 22 - 32 mmol/L    Comment: RESULT REPEATED AND VERIFIED   Glucose, Bld 93 65 - 99 mg/dL   BUN 9 6 - 20 mg/dL   Creatinine, Ser 0.78 0.61 - 1.24 mg/dL   Calcium 8.5 (L) 8.9 - 10.3 mg/dL    Comment: RESULT REPEATED AND VERIFIED   Total Protein 7.7 6.5 - 8.1 g/dL   Albumin 4.0 3.5 - 5.0 g/dL   AST 51 (H) 15 - 41 U/L   ALT 36 17 - 63 U/L   Alkaline Phosphatase 89 38 - 126 U/L   Total Bilirubin 0.3 0.3 - 1.2 mg/dL   GFR calc non Af Amer >60 >60 mL/min   GFR calc Af Amer >60 >60 mL/min    Comment: (NOTE) The eGFR has been calculated using the CKD EPI equation. This calculation has not been validated in all clinical situations. eGFR's persistently <60 mL/min signify possible Chronic Kidney Disease.    Anion gap 21 (H) 5 - 15    Comment: REPEATED TO  VERIFY  Ethanol     Status: Abnormal   Collection Time: 09/17/15  1:48 PM  Result Value Ref Range   Alcohol, Ethyl (B) 287 (H) <5 mg/dL    Comment:        LOWEST DETECTABLE LIMIT FOR SERUM ALCOHOL IS 5 mg/dL FOR MEDICAL PURPOSES ONLY   Lipase, blood     Status: None   Collection Time: 09/17/15  1:48 PM  Result Value Ref Range   Lipase 29 11 -  51 U/L  Salicylate level     Status: None   Collection Time: 09/17/15  1:48 PM  Result Value Ref Range   Salicylate Lvl 5.9 2.8 - 30.0 mg/dL  Acetaminophen level     Status: Abnormal   Collection Time: 09/17/15  1:48 PM  Result Value Ref Range   Acetaminophen (Tylenol), Serum <10 (L) 10 - 30 ug/mL    Comment:        THERAPEUTIC CONCENTRATIONS VARY SIGNIFICANTLY. A RANGE OF 10-30 ug/mL MAY BE AN EFFECTIVE CONCENTRATION FOR MANY PATIENTS. HOWEVER, SOME ARE BEST TREATED AT CONCENTRATIONS OUTSIDE THIS RANGE. ACETAMINOPHEN CONCENTRATIONS >150 ug/mL AT 4 HOURS AFTER INGESTION AND >50 ug/mL AT 12 HOURS AFTER INGESTION ARE OFTEN ASSOCIATED WITH TOXIC REACTIONS.   CBC with Differential     Status: Abnormal   Collection Time: 09/17/15  3:25 PM  Result Value Ref Range   WBC 9.7 4.0 - 10.5 K/uL   RBC 4.95 4.22 - 5.81 MIL/uL   Hemoglobin 14.6 13.0 - 17.0 g/dL   HCT 42.2 39.0 - 52.0 %   MCV 85.3 78.0 - 100.0 fL   MCH 29.5 26.0 - 34.0 pg   MCHC 34.6 30.0 - 36.0 g/dL   RDW 14.6 11.5 - 15.5 %   Platelets 278 150 - 400 K/uL   Neutrophils Relative % 74 %   Neutro Abs 7.2 1.7 - 7.7 K/uL   Lymphocytes Relative 9 %   Lymphs Abs 0.9 0.7 - 4.0 K/uL   Monocytes Relative 17 %   Monocytes Absolute 1.6 (H) 0.1 - 1.0 K/uL   Eosinophils Relative 0 %   Eosinophils Absolute 0.0 0.0 - 0.7 K/uL   Basophils Relative 0 %   Basophils Absolute 0.0 0.0 - 0.1 K/uL  Protime-INR     Status: None   Collection Time: 09/17/15  3:25 PM  Result Value Ref Range   Prothrombin Time 12.2 11.6 - 15.2 seconds   INR 0.88 0.00 - 1.49  APTT     Status: None    Collection Time: 09/17/15  3:25 PM  Result Value Ref Range   aPTT 27 24 - 37 seconds  Occult bld gastric/duodenum (cup to lab)     Status: Abnormal   Collection Time: 09/17/15  3:30 PM  Result Value Ref Range   pH, Gastric 3    Occult Blood, Gastric POSITIVE (A) NEGATIVE  Type and screen [place]     Status: None   Collection Time: 09/17/15  3:40 PM  Result Value Ref Range   ABO/RH(D) O POS    Antibody Screen NEG    Sample Expiration 09/20/2015   ABO/Rh     Status: None   Collection Time: 09/17/15  3:52 PM  Result Value Ref Range   ABO/RH(D) O POS   Urinalysis, Routine w reflex microscopic     Status: None   Collection Time: 09/17/15  4:20 PM  Result Value Ref Range   Color, Urine YELLOW YELLOW   APPearance CLEAR CLEAR   Specific Gravity, Urine 1.008 1.005 - 1.030   pH 5.5 5.0 - 8.0   Glucose, UA NEGATIVE NEGATIVE mg/dL   Hgb urine dipstick NEGATIVE NEGATIVE   Bilirubin Urine NEGATIVE NEGATIVE   Ketones, ur NEGATIVE NEGATIVE mg/dL   Protein, ur NEGATIVE NEGATIVE mg/dL   Nitrite NEGATIVE NEGATIVE   Leukocytes, UA NEGATIVE NEGATIVE    Comment: MICROSCOPIC NOT DONE ON URINES WITH NEGATIVE PROTEIN, BLOOD, LEUKOCYTES, NITRITE, OR GLUCOSE <1000 mg/dL.  Urine rapid drug screen (  hosp performed)     Status: None   Collection Time: 09/17/15  4:20 PM  Result Value Ref Range   Opiates NONE DETECTED NONE DETECTED   Cocaine NONE DETECTED NONE DETECTED   Benzodiazepines NONE DETECTED NONE DETECTED   Amphetamines NONE DETECTED NONE DETECTED   Tetrahydrocannabinol NONE DETECTED NONE DETECTED   Barbiturates NONE DETECTED NONE DETECTED    Comment:        DRUG SCREEN FOR MEDICAL PURPOSES ONLY.  IF CONFIRMATION IS NEEDED FOR ANY PURPOSE, NOTIFY LAB WITHIN 5 DAYS.        LOWEST DETECTABLE LIMITS FOR URINE DRUG SCREEN Drug Class       Cutoff (ng/mL) Amphetamine      1000 Barbiturate      200 Benzodiazepine   638 Tricyclics       756 Opiates          300 Cocaine           300 THC              50   Comprehensive metabolic panel     Status: Abnormal   Collection Time: 09/17/15  7:25 PM  Result Value Ref Range   Sodium 126 (L) 135 - 145 mmol/L   Potassium 2.7 (LL) 3.5 - 5.1 mmol/L    Comment: CRITICAL RESULT CALLED TO, READ BACK BY AND VERIFIED WITHJonette Eva RN 2114 09/17/15 A NAVARRO    Chloride 76 (L) 101 - 111 mmol/L   CO2 31 22 - 32 mmol/L   Glucose, Bld 68 65 - 99 mg/dL   BUN 8 6 - 20 mg/dL   Creatinine, Ser 0.73 0.61 - 1.24 mg/dL   Calcium 7.9 (L) 8.9 - 10.3 mg/dL   Total Protein 7.0 6.5 - 8.1 g/dL   Albumin 3.7 3.5 - 5.0 g/dL   AST 47 (H) 15 - 41 U/L   ALT 31 17 - 63 U/L   Alkaline Phosphatase 85 38 - 126 U/L   Total Bilirubin 0.5 0.3 - 1.2 mg/dL   GFR calc non Af Amer >60 >60 mL/min   GFR calc Af Amer >60 >60 mL/min    Comment: (NOTE) The eGFR has been calculated using the CKD EPI equation. This calculation has not been validated in all clinical situations. eGFR's persistently <60 mL/min signify possible Chronic Kidney Disease.    Anion gap 19 (H) 5 - 15  Magnesium     Status: None   Collection Time: 09/17/15  7:25 PM  Result Value Ref Range   Magnesium 2.1 1.7 - 2.4 mg/dL  Phosphorus     Status: None   Collection Time: 09/17/15  7:25 PM  Result Value Ref Range   Phosphorus 2.9 2.5 - 4.6 mg/dL  CBC WITH DIFFERENTIAL     Status: Abnormal   Collection Time: 09/17/15  8:20 PM  Result Value Ref Range   WBC 10.3 4.0 - 10.5 K/uL   RBC 4.62 4.22 - 5.81 MIL/uL   Hemoglobin 13.8 13.0 - 17.0 g/dL   HCT 40.0 39.0 - 52.0 %   MCV 86.6 78.0 - 100.0 fL   MCH 29.9 26.0 - 34.0 pg   MCHC 34.5 30.0 - 36.0 g/dL   RDW 14.9 11.5 - 15.5 %   Platelets 281 150 - 400 K/uL   Neutrophils Relative % 81 %   Lymphocytes Relative 12 %   Monocytes Relative 7 %   Eosinophils Relative 0 %   Basophils Relative 0 %   Neutro Abs  8.4 (H) 1.7 - 7.7 K/uL   Lymphs Abs 1.2 0.7 - 4.0 K/uL   Monocytes Absolute 0.7 0.1 - 1.0 K/uL   Eosinophils Absolute  0.0 0.0 - 0.7 K/uL   Basophils Absolute 0.0 0.0 - 0.1 K/uL   RBC Morphology TARGET CELLS     Comment: RARE NRBCs  TSH     Status: Abnormal   Collection Time: 09/17/15  8:20 PM  Result Value Ref Range   TSH 0.240 (L) 0.350 - 4.500 uIU/mL  Protime-INR     Status: None   Collection Time: 09/17/15  8:20 PM  Result Value Ref Range   Prothrombin Time 12.1 11.6 - 15.2 seconds   INR 0.88 0.00 - 1.49  APTT     Status: None   Collection Time: 09/17/15  8:20 PM  Result Value Ref Range   aPTT 27 24 - 37 seconds  Magnesium     Status: None   Collection Time: 09/18/15  6:29 AM  Result Value Ref Range   Magnesium 2.0 1.7 - 2.4 mg/dL  Basic metabolic panel     Status: Abnormal   Collection Time: 09/18/15  6:29 AM  Result Value Ref Range   Sodium 126 (L) 135 - 145 mmol/L   Potassium 3.3 (L) 3.5 - 5.1 mmol/L    Comment: DELTA CHECK NOTED   Chloride 82 (L) 101 - 111 mmol/L   CO2 33 (H) 22 - 32 mmol/L   Glucose, Bld 86 65 - 99 mg/dL   BUN 11 6 - 20 mg/dL   Creatinine, Ser 0.85 0.61 - 1.24 mg/dL   Calcium 7.8 (L) 8.9 - 10.3 mg/dL   GFR calc non Af Amer >60 >60 mL/min   GFR calc Af Amer >60 >60 mL/min    Comment: (NOTE) The eGFR has been calculated using the CKD EPI equation. This calculation has not been validated in all clinical situations. eGFR's persistently <60 mL/min signify possible Chronic Kidney Disease.    Anion gap 11 5 - 15  CBC     Status: Abnormal   Collection Time: 09/18/15  6:29 AM  Result Value Ref Range   WBC 11.2 (H) 4.0 - 10.5 K/uL   RBC 4.20 (L) 4.22 - 5.81 MIL/uL   Hemoglobin 12.8 (L) 13.0 - 17.0 g/dL   HCT 36.6 (L) 39.0 - 52.0 %   MCV 87.1 78.0 - 100.0 fL   MCH 30.5 26.0 - 34.0 pg   MCHC 35.0 30.0 - 36.0 g/dL   RDW 15.2 11.5 - 15.5 %   Platelets 226 150 - 400 K/uL  Glucose, capillary     Status: None   Collection Time: 09/18/15  7:43 AM  Result Value Ref Range   Glucose-Capillary 88 65 - 99 mg/dL   Comment 1 Notify RN     Current Facility-Administered  Medications  Medication Dose Route Frequency Provider Last Rate Last Dose  . acetaminophen (TYLENOL) tablet 650 mg  650 mg Oral Q6H PRN Robbie Lis, MD       Or  . acetaminophen (TYLENOL) suppository 650 mg  650 mg Rectal Q6H PRN Robbie Lis, MD   650 mg at 09/17/15 1903  . albuterol (PROVENTIL) (2.5 MG/3ML) 0.083% nebulizer solution 2.5 mg  2.5 mg Inhalation Q4H PRN Robbie Lis, MD      . cloNIDine (CATAPRES) tablet 0.2 mg  0.2 mg Oral TID Robbie Lis, MD   0.2 mg at 09/18/15 0932  . FLUoxetine (PROZAC) capsule 20 mg  20  mg Oral Daily Robbie Lis, MD   20 mg at 09/18/15 0932  . gabapentin (NEURONTIN) capsule 100 mg  100 mg Oral TID Robbie Lis, MD   100 mg at 09/18/15 0933  . ipratropium-albuterol (DUONEB) 0.5-2.5 (3) MG/3ML nebulizer solution 3 mL  3 mL Nebulization Q6H Orson Eva, MD   3 mL at 09/18/15 1410  . metoprolol tartrate (LOPRESSOR) tablet 25 mg  25 mg Oral QAC breakfast Robbie Lis, MD   25 mg at 09/18/15 0836  . nystatin (MYCOSTATIN) 100000 UNIT/ML suspension 500,000 Units  5 mL Oral QID Robbie Lis, MD   500,000 Units at 09/18/15 1432  . ondansetron (ZOFRAN) tablet 4 mg  4 mg Oral Q6H PRN Robbie Lis, MD       Or  . ondansetron [facility]) injection 4 mg  4 mg Intravenous Q6H PRN Robbie Lis, MD   4 mg at 09/17/15 2300  . [START ON 09/20/2015] pantoprazole (PROTONIX) injection 40 mg  40 mg Intravenous Q12H Robbie Lis, MD      . Derrill Memo ON 09/19/2015] predniSONE (DELTASONE) tablet 50 mg  50 mg Oral Q breakfast Shanon Brow Tat, MD      . sodium chloride 0.9 % 1,000 mL with potassium chloride 20 mEq infusion   Intravenous Continuous Orson Eva, MD 75 mL/hr at 09/18/15 1002    . sodium chloride flush (NS) 0.9 % injection 3 mL  3 mL Intravenous Q12H Robbie Lis, MD   3 mL at 09/17/15 2200  . traZODone (DESYREL) tablet 100 mg  100 mg Oral QHS Robbie Lis, MD   100 mg at 09/17/15 2241    Musculoskeletal: Strength & Muscle Tone: within normal limits Gait & Station:  normal Patient leans: N/A  Psychiatric Specialty Exam: ROS  No Fever-chills, No Headache, No changes with Vision or hearing, reports vertigo No problems swallowing food or Liquids, No Chest pain, Cough or Shortness of Breath, No Abdominal pain, No Nausea or Vommitting, Bowel movements are regular, No Blood in stool or Urine, No dysuria, No new skin rashes or bruises, No new joints pains-aches,  No new weakness, tingling, numbness in any extremity, No recent weight gain or loss, No polyuria, polydypsia or polyphagia,  A full 10 point Review of Systems was done, except as stated above, all other Review of Systems were negative.  Blood pressure 119/65, pulse 114, temperature 100.3 F (37.9 C), temperature source Oral, resp. rate 18, SpO2 98 %.There is no weight on file to calculate BMI.  General Appearance: Casual  Eye Contact::  Good  Speech:  Clear and Coherent  Volume:  Normal  Mood:  Anxious and Depressed  Affect:  Constricted and Depressed  Thought Process:  Coherent and Goal Directed  Orientation:  Full (Time, Place, and Person)  Thought Content:  WDL  Suicidal Thoughts:  No  Homicidal Thoughts:  No  Memory:  Immediate;   Good Recent;   Good Remote;   Good  Judgement:  Impaired  Insight:  Good  Psychomotor Activity:  Normal  Concentration:  Good  Recall:  Good  Fund of Knowledge:Good  Language: Good  Akathisia:  Negative  Handed:  Right  AIMS (if indicated):     Assets:  Communication Skills Desire for Improvement Financial Resources/Insurance Housing Leisure Time Social Support Transportation  ADL's:  Intact  Cognition: WNL  Sleep:      Treatment Plan Summary: Daily contact with patient to assess and evaluate symptoms and progress in treatment  and Medication management  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 Continue CIWA and ativan detox protocol Continue Prozac 20 mg PO Qam for depression and trazodone 100 mg PO Qhs for insomnia Appreciate  psychiatric consultation and will sign off Please contact [phone] or [phone] if needs further assistance   Disposition: LCSW for psychosocial issues and out patient care needs Patient does not meet criteria for psychiatric inpatient admission. Supportive therapy provided about ongoing stressors. Refer to IOP.  Durward Parcel., MD 09/18/2015 3:34 PM

## 2015-09-18 NOTE — Progress Notes (Signed)
Utilization review completed.  

## 2015-09-18 NOTE — Evaluation (Signed)
Physical Therapy Evaluation Patient Details Name: Samuel Reyes MRN: [id number] DOB: 01-Jun-1956 Today's Date: 09/18/2015   History of Present Illness  Pt admitted through ED as suicidal and with hx of ETOH abuse  Clinical Impression  Pt admitted as above and presenting with functional mobility limitations 2* generalized weakness, ambulatory instability and limited endurance.  Pt hopes to progress to dc home with assistance of roommate and friends.    Follow Up Recommendations No PT follow up;Home health PT (dependent on acute stay progress)    Equipment Recommendations  None recommended by PT    Recommendations for Other Services OT consult     Precautions / Restrictions Precautions Precautions: Fall Restrictions Weight Bearing Restrictions: No      Mobility  Bed Mobility Overal bed mobility: Modified Independent             General bed mobility comments: Pt to EOB unassisted  Transfers Overall transfer level: Needs assistance Equipment used: None Transfers: Sit to/from Stand Sit to Stand: Min assist         General transfer comment: Cues for saftey and use of UEs to self assist.  Min assist to rise and steady  Ambulation/Gait Ambulation/Gait assistance: Min assist Ambulation Distance (Feet): 50 Feet Assistive device: None Gait Pattern/deviations: Step-through pattern;Decreased step length - right;Decreased step length - left;Shuffle;Wide base of support;Staggering left;Staggering right Gait velocity: decr   General Gait Details: General instability all directions with pt attempting to compensate with widened BOS.  Pt states he is typically much steadier.  Distance ltd by pt c/o dizziness - BP 126/74 and SaO2 90% with HR 113  Stairs            Wheelchair Mobility    Modified Rankin (Stroke Patients Only)       Balance Overall balance assessment: Needs assistance Sitting-balance support: Feet supported;No upper extremity supported Sitting  balance-Leahy Scale: Good     Standing balance support: No upper extremity supported Standing balance-Leahy Scale: Fair                               Pertinent Vitals/Pain Pain Assessment: Faces Faces Pain Scale: Hurts a little bit Pain Location: Pt reports "achy all over" Pain Descriptors / Indicators: Aching Pain Intervention(s): Limited activity within patient's tolerance;Monitored during session    [place] expects to be discharged to:: Private residence Living Arrangements: Non-relatives/Friends (Pt states "I live with another guy") Available Help at Discharge: Friend(s) Type of Home: House Home Access: Stairs to enter Entrance Stairs-Rails: Psychiatric nurse of Steps: several Home Layout: One level Home Equipment: Cane - single point      Prior Function Level of Independence: Independent               Hand Dominance        Extremity/Trunk Assessment   Upper Extremity Assessment: Generalized weakness           Lower Extremity Assessment: Generalized weakness      Cervical / Trunk Assessment: Normal  Communication   Communication: No difficulties  Cognition Arousal/Alertness: Awake/alert Behavior During Therapy: WFL for tasks assessed/performed Overall Cognitive Status: Within Functional Limits for tasks assessed                      General Comments      Exercises        Assessment/Plan    PT Assessment Patient needs continued PT services  PT Diagnosis Difficulty walking   PT Problem List Decreased strength;Decreased activity tolerance;Decreased balance;Decreased mobility;Decreased knowledge of use of DME;Decreased safety awareness  PT Treatment Interventions DME instruction;Gait training;Stair training;Functional mobility training;Therapeutic activities;Therapeutic exercise;Balance training;Patient/family education   PT Goals (Current goals can be found in the Care Plan section)  Acute Rehab PT Goals Patient Stated Goal: HOME PT Goal Formulation: With patient Time For Goal Achievement: 10/01/15 Potential to Achieve Goals: Fair    Frequency Min 3X/week   Barriers to discharge Decreased caregiver support Pt states he lives with friend who can assist but unclear on limits of assistance    Co-evaluation               End of Session Equipment Utilized During Treatment: Gait belt Activity Tolerance: Patient limited by fatigue;Other (comment) (c/o dizziness) Patient left: in chair;with call bell/phone within reach;with nursing/sitter in room Nurse Communication: Mobility status         Time: 0910-0928 PT Time Calculation (min) (ACUTE ONLY): 18 min   Charges:   PT Evaluation $PT Eval Low Complexity: 1 Procedure     PT G Codes:        Samuel Reyes October 12, 2015, 12:46 PM

## 2015-09-19 LAB — CBC
HCT: 35.6 % — ABNORMAL LOW (ref 39.0–52.0)
Hemoglobin: 11.6 g/dL — ABNORMAL LOW (ref 13.0–17.0)
MCH: 30.1 pg (ref 26.0–34.0)
MCHC: 32.6 g/dL (ref 30.0–36.0)
MCV: 92.5 fL (ref 78.0–100.0)
Platelets: 216 K/uL (ref 150–400)
RBC: 3.85 MIL/uL — ABNORMAL LOW (ref 4.22–5.81)
RDW: 15.5 % (ref 11.5–15.5)
WBC: 10.4 K/uL (ref 4.0–10.5)

## 2015-09-19 LAB — GLUCOSE, CAPILLARY: Glucose-Capillary: 118 mg/dL — ABNORMAL HIGH (ref 65–99)

## 2015-09-19 LAB — BASIC METABOLIC PANEL
ANION GAP: 7 (ref 5–15)
BUN: 8 mg/dL (ref 6–20)
CALCIUM: 7.8 mg/dL — AB (ref 8.9–10.3)
CO2: 31 mmol/L (ref 22–32)
Chloride: 91 mmol/L — ABNORMAL LOW (ref 101–111)
Creatinine, Ser: 0.62 mg/dL (ref 0.61–1.24)
GLUCOSE: 99 mg/dL (ref 65–99)
POTASSIUM: 3.2 mmol/L — AB (ref 3.5–5.1)
Sodium: 129 mmol/L — ABNORMAL LOW (ref 135–145)

## 2015-09-19 LAB — MAGNESIUM: MAGNESIUM: 2 mg/dL (ref 1.7–2.4)

## 2015-09-19 MED ORDER — IPRATROPIUM-ALBUTEROL 0.5-2.5 (3) MG/3ML IN SOLN
3.0000 mL | Freq: Two times a day (BID) | RESPIRATORY_TRACT | Status: DC
  Administered 2015-09-20 – 2015-09-21 (×3): 3 mL via RESPIRATORY_TRACT
  Filled 2015-09-19 (×3): qty 3

## 2015-09-19 MED ORDER — LORAZEPAM 1 MG PO TABS
1.0000 mg | ORAL_TABLET | Freq: Four times a day (QID) | ORAL | Status: DC | PRN
  Administered 2015-09-19: 1 mg via ORAL
  Filled 2015-09-19: qty 1

## 2015-09-19 MED ORDER — POTASSIUM CHLORIDE CRYS ER 20 MEQ PO TBCR
40.0000 meq | EXTENDED_RELEASE_TABLET | Freq: Once | ORAL | Status: AC
  Administered 2015-09-19: 40 meq via ORAL
  Filled 2015-09-19: qty 2

## 2015-09-19 MED ORDER — ADULT MULTIVITAMIN W/MINERALS CH
1.0000 | ORAL_TABLET | Freq: Every day | ORAL | Status: DC
  Administered 2015-09-19 – 2015-09-21 (×3): 1 via ORAL
  Filled 2015-09-19 (×3): qty 1

## 2015-09-19 MED ORDER — LORAZEPAM 2 MG/ML IJ SOLN
1.0000 mg | Freq: Once | INTRAMUSCULAR | Status: AC
  Administered 2015-09-19: 1 mg via INTRAVENOUS
  Filled 2015-09-19: qty 1

## 2015-09-19 MED ORDER — CLONIDINE HCL 0.1 MG PO TABS
0.1000 mg | ORAL_TABLET | Freq: Three times a day (TID) | ORAL | Status: DC
  Administered 2015-09-19 – 2015-09-21 (×8): 0.1 mg via ORAL
  Filled 2015-09-19 (×9): qty 1

## 2015-09-19 MED ORDER — CHLORHEXIDINE GLUCONATE 0.12 % MT SOLN
15.0000 mL | Freq: Two times a day (BID) | OROMUCOSAL | Status: DC
  Administered 2015-09-19 – 2015-09-21 (×5): 15 mL via OROMUCOSAL
  Filled 2015-09-19 (×6): qty 15

## 2015-09-19 MED ORDER — PANTOPRAZOLE SODIUM 40 MG PO TBEC
40.0000 mg | DELAYED_RELEASE_TABLET | Freq: Two times a day (BID) | ORAL | Status: DC
  Administered 2015-09-19 – 2015-09-21 (×5): 40 mg via ORAL
  Filled 2015-09-19 (×6): qty 1

## 2015-09-19 MED ORDER — FOLIC ACID 1 MG PO TABS
1.0000 mg | ORAL_TABLET | Freq: Every day | ORAL | Status: DC
  Administered 2015-09-19 – 2015-09-21 (×3): 1 mg via ORAL
  Filled 2015-09-19 (×3): qty 1

## 2015-09-19 MED ORDER — THIAMINE HCL 100 MG/ML IJ SOLN
100.0000 mg | Freq: Every day | INTRAMUSCULAR | Status: DC
  Filled 2015-09-19 (×3): qty 1

## 2015-09-19 MED ORDER — ENSURE ENLIVE PO LIQD
237.0000 mL | Freq: Two times a day (BID) | ORAL | Status: DC
  Administered 2015-09-19 – 2015-09-21 (×4): 237 mL via ORAL

## 2015-09-19 MED ORDER — NICOTINE 21 MG/24HR TD PT24
21.0000 mg | MEDICATED_PATCH | Freq: Every day | TRANSDERMAL | Status: DC
  Administered 2015-09-19 – 2015-09-21 (×3): 21 mg via TRANSDERMAL
  Filled 2015-09-19 (×3): qty 1

## 2015-09-19 MED ORDER — VITAMIN B-1 100 MG PO TABS
100.0000 mg | ORAL_TABLET | Freq: Every day | ORAL | Status: DC
  Administered 2015-09-19 – 2015-09-21 (×3): 100 mg via ORAL
  Filled 2015-09-19 (×3): qty 1

## 2015-09-19 MED ORDER — LORAZEPAM 2 MG/ML IJ SOLN
1.0000 mg | Freq: Four times a day (QID) | INTRAMUSCULAR | Status: DC | PRN
Start: 2015-09-19 — End: 2015-09-20
  Administered 2015-09-19: 1 mg via INTRAVENOUS
  Filled 2015-09-19: qty 1

## 2015-09-19 NOTE — Progress Notes (Signed)
Subjective: No further hematemesis. No blood in stool. Upper abdominal pain improving.  Objective: Vital signs in last 24 hours: Temp:  [97.9 F (36.6 C)-98.6 F (37 C)] 97.9 F (36.6 C) (02/20 0804) Pulse Rate:  [72-83] 78 (02/20 0804) Resp:  [16-20] 16 (02/20 0548) BP: (101-127)/(58-67) 127/62 mmHg (02/20 0804) SpO2:  [91 %-100 %] 95 % (02/20 0944) Weight:  [68.765 kg (151 lb 9.6 oz)] 68.765 kg (151 lb 9.6 oz) (02/20 0645) Weight change:  Last BM Date: 09/20/15  PE: GEN:  Chronically ill-appearing, older-appearing than stated age ABD:  Soft, mild epigastric and left upper quadrant tenderness without peritonitis SKIN:  Several tattoos  Lab Results: CBC    Component Value Date/Time   WBC 10.4 09/19/2015 0553   RBC 3.85* 09/19/2015 0553   HGB 11.6* 09/19/2015 0553   HCT 35.6* 09/19/2015 0553   PLT 216 09/19/2015 0553   MCV 92.5 09/19/2015 0553   MCH 30.1 09/19/2015 0553   MCHC 32.6 09/19/2015 0553   RDW 15.5 09/19/2015 0553   LYMPHSABS 1.2 09/17/2015 2020   MONOABS 0.7 09/17/2015 2020   EOSABS 0.0 09/17/2015 2020   BASOSABS 0.0 09/17/2015 2020   CMP     Component Value Date/Time   NA 129* 09/19/2015 0553   K 3.2* 09/19/2015 0553   CL 91* 09/19/2015 0553   CO2 31 09/19/2015 0553   GLUCOSE 99 09/19/2015 0553   BUN 8 09/19/2015 0553   CREATININE 0.62 09/19/2015 0553   CREATININE 0.95 09/22/2014 1106   CALCIUM 7.8* 09/19/2015 0553   PROT 7.0 09/17/2015 1925   ALBUMIN 3.7 09/17/2015 1925   AST 47* 09/17/2015 1925   ALT 31 09/17/2015 1925   ALKPHOS 85 09/17/2015 1925   BILITOT 0.5 09/17/2015 1925   GFRNONAA >60 09/19/2015 0553   GFRNONAA 87 09/22/2014 1106   GFRAA >60 09/19/2015 0553   GFRAA >89 09/22/2014 1106   Assessment:  1.  Coffee ground emesis.  Likely NSAID and alcohol-mediated esophagogastritis.  No evidence of active ongoing GI tract bleeding. 2.  Alcohol abuse. 3.  Possible early cirrhosis based on CT scan 2016.  Plan:  1.  Advance diet as  tolerated. 2.  Pantoprazole 40 mg po qd, now and upon discharge. 3.  Alcohol cessation counseling.  Would benefit from intensive outpatient alcohol rehabilitation counseling. 4.  No NSAIDs. 5.  No further GI evaluation anticipated as inpatient; patient can follow-up with his primary gastroenterologist, Dr. Barney Drain, upon discharge. 6.  [facility] will sign-off; please call with questions; thank you for the consultation.   Landry Dyke 09/19/2015, 10:39 AM   Pager [phone] If no answer or after 5 PM call [phone]

## 2015-09-19 NOTE — Progress Notes (Signed)
Initial Nutrition Assessment  DOCUMENTATION CODES:   Not applicable  INTERVENTION:  - Continue Soft diet - RD will continue to monitor for needs  NUTRITION DIAGNOSIS:   Inadequate oral intake related to social / environmental circumstances as evidenced by per patient/family report  GOAL:   Patient will meet greater than or equal to 90% of their needs  MONITOR:   PO intake, Weight trends, Labs, I & O's  REASON FOR ASSESSMENT:   Malnutrition Screening Tool, Consult Assessment of nutrition requirement/status  ASSESSMENT:   60 year old male with past medical history of alcohol abuse, dependence, hypertension who presented to [facility] ED and apparently wanted to kill himself. He is not a good historian due to acute alcohol intoxication. He had an episode of coffee ground emesis in ED and says he does not feel good.   Pt seen for MST and consult. BMI indicates overweight status. Pt's diet advanced from CLD to Soft at 0934 today. Pt states that he has been drinking liquids all day, sitter at bedside confirms, and pt denies abdominal pain or nausea associated with intakes of liquids. He states that for 1 week PTA he was drinking 5-6 forty ounce beers/day and smoking 1 pack of cigarettes/day and was not consuming food or any other liquids. He states that when he drinks heavily he does not eat. Pt with tremors at this time related to withdrawal.   Physical assessment not performed but no visualization of muscle or fat wasting at this time. Per chart review, pt has gained 6 lbs in the past 3 months.  Not meeting needs for at least 1 week PTA due to alcohol abuse. Medications reviewed. Labs reviewed; Na: 128 mmol/L, K: 3.2 mmol/L, Cl: 91 mmol/L, Ca: 7.8 mg/dL.    Diet Order:  DIET SOFT Room service appropriate?: Yes; Fluid consistency:: Thin  Skin:  Reviewed, no issues  Last BM:  2/20  Height:   Ht Readings from Last 1 Encounters:  09/02/15 5\' 4"  (1.626 m)    Weight:   Wt Readings  from Last 1 Encounters:  09/19/15 151 lb 9.6 oz (68.765 kg)    Ideal Body Weight:  59.09 kg (kg)  BMI:  Body mass index is 26.01 kg/(m^2).  Estimated Nutritional Needs:   Kcal:  [id number]  Protein:  70-80 grams  Fluid:  2 L/day  EDUCATION NEEDS:   No education needs identified at this time      Jarome Matin, RD, LDN Inpatient Clinical Dietitian Pager # [phone] After hours/weekend pager # [phone]

## 2015-09-19 NOTE — Progress Notes (Signed)
PROGRESS NOTE  Samuel Reyes [id number] DOB: Feb 18, 1956 DOA: 09/17/2015 PCP: Lorayne Marek, MD  Brief history 60 year old male with a history of Barrett's esophagus, alcohol abuse, hypertension, COPD presents to the emergency department with coffee grounds emesis and intoxicated. He was suicidal in the emergency department He was noted to have coffee grounds emesis during his stay in the emergency department. Alcohol level was 287. Hemoglobin and blood pressures have been stable. The patient was started on intravenous Protonix. Psychiatry and GI have been consulted.  GI did not feel pt needs endoscopy at this time Assessment/Plan: Hematemesis -pt with hx of Barrett's seen on EGD on 01/04/14 and Bilroth I bypass -secondary to Etoh and NSAIDS -continue Protonix IV -Hemoglobin remains stable, hemodynamically stable -advance to soft diet -Appreciate GI--no further intervention at this time Acute respiratory failure with hypoxia/COPD exacerbation -oxygen saturation 88% on room air with increasing shortness of breath -Start duo nebs -Start prednisone D#1 of 5 -Pulmonary hygiene -stable on RA -ambulatory pulseox Suicidal ideation -continue sitter -psychiatry consult -remains suicidal without plan Alcohol intoxication with history of withdrawal -Last alcoholic beverage [id number] -Drinks 2 x 40oz beers daily -CIWA -UDS--neg Hyponatremia -Secondary to volume depletion -Continue IV fluids-->improving Hypokalemia -replete -check mag--2.0 Hypertension -Continue metoprolol tartrate -continue clonidine Depression -Continue Prozac and trazodone Tobacco abuse -no desire to quit   Family Communication: Pt at beside [facility]position Plan: [facility] Pending psychiatry input      Procedures/Studies: Dg Chest 2 View  09/02/2015  CLINICAL DATA:  Tachycardia.  COPD. EXAM: CHEST  2 VIEW COMPARISON:  Chest radiograph 06/15/2015. FINDINGS: Multiple monitoring leads overlie  the patient. Stable cardiac and mediastinal contours. No consolidative pulmonary opacities. No pleural effusion or pneumothorax. Thoracic spine is unremarkable. IMPRESSION: No acute cardiopulmonary process. Electronically Signed   By: Lovey Newcomer M.D.   On: 09/02/2015 17:49         Subjective: Patient says that he is breathing better but feels some chest discomfort with "aa bowling ball" sitting on his chest. Denies anyvomiting, diarrhea, fevers, chills, dysuria, hematuria.  Objective: Filed Vitals:   09/18/15 2119 09/19/15 0548 09/19/15 0645 09/19/15 0804  BP: 101/64 106/58  127/62  Pulse: 78 72  78  Temp: 98 F (36.7 C) 97.9 F (36.6 C)  97.9 F (36.6 C)  TempSrc: Oral Oral  Oral  Resp: 18 16    Weight:   68.765 kg (151 lb 9.6 oz)   SpO2: 96% 91%  100%    Intake/Output Summary (Last 24 hours) at 09/19/15 0936 Last data filed at 09/19/15 0846  Gross per 24 hour  Intake   3235 ml  Output   2300 ml  Net    935 ml   Weight change:  Exam:   General:  Pt is alert, follows commands appropriately, not in acute distress  HEENT: No icterus, No thrush, No neck mass, /AT  Cardiovascular: RRR, S1/S2, no rubs, no gallops  Respiratory: CTA bilaterally, no wheezing, no crackles, no rhonchi  Abdomen: Soft/+BS, non tender, non distended, no guarding  Extremities: No edema, No lymphangitis, No petechiae, No rashes, no synovitis  Data Reviewed: Basic Metabolic Panel:  Recent Labs Lab 09/17/15 1348 09/17/15 1925 09/18/15 0629 09/19/15 0553  NA 121* 126* 126* 129*  K 2.8* 2.7* 3.3* 3.2*  CL 71* 76* 82* 91*  CO2 29 31 33* 31  GLUCOSE 93 68 86 99  BUN 9 8 11 8   CREATININE 0.78 0.73 0.85 0.62  CALCIUM  8.5* 7.9* 7.8* 7.8*  MG  --  2.1 2.0 2.0  PHOS  --  2.9  --   --    Liver Function Tests:  Recent Labs Lab 09/17/15 1348 09/17/15 1925  AST 51* 47*  ALT 36 31  ALKPHOS 89 85  BILITOT 0.3 0.5  PROT 7.7 7.0  ALBUMIN 4.0 3.7    Recent Labs Lab 09/17/15 1348    LIPASE 29   No results for input(s): AMMONIA in the last 168 hours. CBC:  Recent Labs Lab 09/17/15 1525 09/17/15 2020 09/18/15 0629 09/19/15 0553  WBC 9.7 10.3 11.2* 10.4  NEUTROABS 7.2 8.4*  --   --   HGB 14.6 13.8 12.8* 11.6*  HCT 42.2 40.0 36.6* 35.6*  MCV 85.3 86.6 87.1 92.5  PLT 278 281 226 216   Cardiac Enzymes: No results for input(s): CKTOTAL, CKMB, CKMBINDEX, TROPONINI in the last 168 hours. BNP: Invalid input(s): POCBNP CBG:  Recent Labs Lab 09/18/15 0743 09/19/15 0737  GLUCAP 88 118*    No results found for this or any previous visit (from the past 240 hour(s)).   Scheduled Meds: . cloNIDine  0.1 mg Oral TID  . FLUoxetine  20 mg Oral Daily  . folic acid  1 mg Oral Daily  . gabapentin  100 mg Oral TID  . ipratropium-albuterol  3 mL Nebulization TID  . metoprolol tartrate  25 mg Oral QAC breakfast  . multivitamin with minerals  1 tablet Oral Daily  . nicotine  21 mg Transdermal Daily  . nystatin  5 mL Oral QID  . [START ON 09/20/2015] pantoprazole (PROTONIX) IV  40 mg Intravenous Q12H  . potassium chloride  40 mEq Oral Once  . predniSONE  50 mg Oral Q breakfast  . sodium chloride flush  3 mL Intravenous Q12H  . thiamine  100 mg Oral Daily   Or  . thiamine  100 mg Intravenous Daily  . traZODone  100 mg Oral QHS   Continuous Infusions: . sodium chloride 0.9 % 1,000 mL with potassium chloride 20 mEq infusion 75 mL/hr at 09/18/15 1002     Franziska Podgurski, DO  [facility] Pager [phone]  If 7PM-7AM, please contact night-coverage [email or web address] Password [facility] 09/19/2015, 9:36 AM   LOS: 2 days

## 2015-09-19 NOTE — Progress Notes (Signed)
This shift pt arousable and slow to respond. No c/o nausea/ vomiting or pain this shift. Continues to have suicidal ideation with no set plan. Sitter at bedside.

## 2015-09-19 NOTE — Progress Notes (Signed)
Physical Therapy Treatment Patient Details Name: Samuel Reyes MRN: [id number] DOB: [birth year] Today's Date: 2015/10/11    History of Present Illness Pt admitted through ED as suicidal and with hx of ETOH abuse    PT Comments    Pt is cooperative with PT, incr activity/amb  tolerance today although continues to be unsteady; He states he thinks his "life is over"; sitter with pt during PT session  Follow Up Recommendations  No PT follow up;Home health PT (dependent on progress)     Equipment Recommendations  None recommended by PT    Recommendations for Other Services       Precautions / Restrictions Precautions Precautions: Fall Restrictions Weight Bearing Restrictions: No    Mobility  Bed Mobility Overal bed mobility: Modified Independent             General bed mobility comments: Pt to EOB unassisted  Transfers Overall transfer level: Needs assistance Equipment used: None Transfers: Sit to/from Stand Sit to Stand: Min guard         General transfer comment: min/guard for safety and balance, pt unsteady initially in standing  Ambulation/Gait Ambulation/Gait assistance: Min assist Ambulation Distance (Feet): 280 Feet Assistive device: 1 person hand held assist;None Gait Pattern/deviations: Step-through pattern;Decreased stride length;Wide base of support;Drifts right/left Gait velocity: decr   General Gait Details: pt with multi-directional drifting and LOB x3 during gait requiring light  min assist to recover; uses wide BOS to compensate and  immediate steppage response with delayed/absent ankle/hip strategy   Stairs            Wheelchair Mobility    Modified Rankin (Stroke Patients Only)       Balance             Standing balance-Leahy Scale: Fair                      Cognition Arousal/Alertness: Awake/alert Behavior During Therapy: WFL for tasks assessed/performed Overall Cognitive Status: Within Functional Limits for  tasks assessed                      Exercises      General Comments        Pertinent Vitals/Pain Pain Assessment: No/denies pain    Home Living Family/patient expects to be discharged to:: Unsure Living Arrangements: Non-relatives/Friends                  Prior Function            PT Goals (current goals can now be found in the care plan section) Acute Rehab PT Goals Patient Stated Goal: HOME PT Goal Formulation: With patient Time For Goal Achievement: 10/01/15 Potential to Achieve Goals: Fair Progress towards PT goals: Progressing toward goals    Frequency  Min 3X/week    PT Plan Current plan remains appropriate    Co-evaluation             End of Session Equipment Utilized During Treatment: Gait belt Activity Tolerance: Patient tolerated treatment well Patient left: in bed;with call bell/phone within reach;with nursing/sitter in room     Time: 1430-1448 PT Time Calculation (min) (ACUTE ONLY): 18 min  Charges:  $Gait Training: 8-22 mins                    G Codes:      Taron Conrey October 11, 2015, 2:56 PM

## 2015-09-20 ENCOUNTER — Inpatient Hospital Stay (HOSPITAL_COMMUNITY): Payer: Medicaid Other

## 2015-09-20 DIAGNOSIS — J441 Chronic obstructive pulmonary disease with (acute) exacerbation: Secondary | ICD-10-CM

## 2015-09-20 LAB — BASIC METABOLIC PANEL
ANION GAP: 7 (ref 5–15)
BUN: 7 mg/dL (ref 6–20)
CALCIUM: 8.2 mg/dL — AB (ref 8.9–10.3)
CHLORIDE: 102 mmol/L (ref 101–111)
CO2: 26 mmol/L (ref 22–32)
CREATININE: 0.62 mg/dL (ref 0.61–1.24)
GFR calc non Af Amer: >60 mL/min (ref >60)
Glucose, Bld: 97 mg/dL (ref 65–99)
Potassium: 3.7 mmol/L (ref 3.5–5.1)
SODIUM: 135 mmol/L (ref 135–145)

## 2015-09-20 LAB — GLUCOSE, CAPILLARY: GLUCOSE-CAPILLARY: 102 mg/dL — AB (ref 65–99)

## 2015-09-20 MED ORDER — LORAZEPAM 2 MG/ML IJ SOLN: 2.0000 mg | INTRAMUSCULAR | Status: DC | PRN

## 2015-09-20 MED ORDER — BUDESONIDE 0.25 MG/2ML IN SUSP
0.2500 mg | Freq: Two times a day (BID) | RESPIRATORY_TRACT | Status: DC
  Administered 2015-09-20 – 2015-09-21 (×2): 0.25 mg via RESPIRATORY_TRACT
  Filled 2015-09-20 (×2): qty 2

## 2015-09-20 MED ORDER — LORAZEPAM 1 MG PO TABS
2.0000 mg | ORAL_TABLET | ORAL | Status: DC | PRN
  Administered 2015-09-20: 2 mg via ORAL
  Filled 2015-09-20: qty 2

## 2015-09-20 NOTE — Progress Notes (Signed)
CSW spoke with MD who placed an order for psych re-eval due to Pt continues to voice SI.   CSW continuing to follow Pt for psych needs.   [place]  [phone]

## 2015-09-20 NOTE — Progress Notes (Signed)
PROGRESS NOTE  Samuel Reyes [id number] DOB: 07/13/1956 DOA: 09/17/2015 PCP: Lorayne Marek, MD Brief history 60 year old male with a history of Barrett's esophagus, alcohol abuse, hypertension, COPD presents to the emergency department with coffee grounds emesis and intoxicated. He was suicidal in the emergency department He was noted to have coffee grounds emesis during his stay in the emergency department. Alcohol level was 287. Hemoglobin and blood pressures have been stable. The patient was started on intravenous Protonix. Psychiatry and GI have been consulted. GI did not feel pt needs endoscopy at this time Assessment/Plan: Coffee Grounds Emesis -pt with hx of Barrett's seen on EGD on 01/04/14 and Bilroth I bypass -secondary to Etoh and NSAIDS -continue Protonix IV -Hemoglobin remains stable, hemodynamically stable -advance to soft diet -Appreciate GI--no further intervention at this time -09/19/15 evening--vomit x 1 but did not show RN if any coffee grounds-->09/20/15 pt at 75% breakfast without difficulty -am CBC Acute respiratory failure with hypoxia/COPD exacerbation -oxygen saturation 88% on room air with increasing shortness of breath -continue duo nebs -add pulmicort neb -Start prednisone D#2 of 5-->wheezing is improving -Pulmonary hygiene -stable on RA -ambulatory pulseox prior to discharge -check CXR-today Suicidal ideation -continue sitter -appreciate psychiatry consult-->reconsult today (2/21) -remains suicidal without plan Alcohol intoxication with history of withdrawal -Last alcoholic beverage [id number] -Drinks 2 x 40oz beers daily; occasional liquor -CIWA--change ativan to Q 4 hours prn -UDS--neg Hyponatremia -Secondary to volume depletion -Continue IV fluids-->improved Hypokalemia -replete -check mag--2.0 Hypertension -Continue metoprolol tartrate -continue clonidine 0.1 mg tid Depression -Continue Prozac and trazodone Tobacco  abuse -no desire to quit   Family Communication:  No family present Disposition Plan:   Likely [facility] in 1-2 days       Procedures/Studies: Dg Chest 2 View  09/02/2015  CLINICAL DATA:  Tachycardia.  COPD. EXAM: CHEST  2 VIEW COMPARISON:  Chest radiograph 06/15/2015. FINDINGS: Multiple monitoring leads overlie the patient. Stable cardiac and mediastinal contours. No consolidative pulmonary opacities. No pleural effusion or pneumothorax. Thoracic spine is unremarkable. IMPRESSION: No acute cardiopulmonary process. Electronically Signed   By: Lovey Newcomer M.D.   On: 09/02/2015 17:49         Subjective: Patient had one episode of emesis last night but did not show though the nursing staff. He ate 75% of his breakfast this morning without difficulty. Complains of abdominal pain but it is somewhat improved. Denies any fevers, chills, chest pain. He has some dyspnea on exertion. No hemoptysis.  Objective: Filed Vitals:   09/19/15 1927 09/19/15 1940 09/19/15 2115 09/20/15 0849  BP:  116/66 119/58   Pulse: 79 101 102   Temp:  98.3 F (36.8 C) 98.1 F (36.7 C)   TempSrc:  Oral Oral   Resp: 16 16 16    Weight:      SpO2: 98% 97% 98% 96%    Intake/Output Summary (Last 24 hours) at 09/20/15 0926 Last data filed at 09/19/15 1653  Gross per 24 hour  Intake   1160 ml  Output    550 ml  Net    610 ml   Weight change:  Exam:   General:  Pt is alert, follows commands appropriately, not in acute distress  HEENT: No icterus, No thrush, No neck mass, [place]/AT  Cardiovascular: RRR, S1/S2, no rubs, no gallops  Respiratory: bibasilar rales. Upper airway wheeze.  Abdomen: Soft/+BS, non tender, non distended, no guarding  Extremities: No edema, No lymphangitis, No petechiae, No rashes, no synovitis  Data Reviewed: Basic Metabolic Panel:  Recent Labs Lab 09/17/15 1348 09/17/15 1925 09/18/15 0629 09/19/15 0553 09/20/15 0558  NA 121* 126* 126* 129* 135  K 2.8* 2.7* 3.3* 3.2* 3.7   CL 71* 76* 82* 91* 102  CO2 29 31 33* 31 26  GLUCOSE 93 68 86 99 97  BUN 9 8 11 8 7   CREATININE 0.78 0.73 0.85 0.62 0.62  CALCIUM 8.5* 7.9* 7.8* 7.8* 8.2*  MG  --  2.1 2.0 2.0  --   PHOS  --  2.9  --   --   --    Liver Function Tests:  Recent Labs Lab 09/17/15 1348 09/17/15 1925  AST 51* 47*  ALT 36 31  ALKPHOS 89 85  BILITOT 0.3 0.5  PROT 7.7 7.0  ALBUMIN 4.0 3.7    Recent Labs Lab 09/17/15 1348  LIPASE 29   No results for input(s): AMMONIA in the last 168 hours. CBC:  Recent Labs Lab 09/17/15 1525 09/17/15 2020 09/18/15 0629 09/19/15 0553  WBC 9.7 10.3 11.2* 10.4  NEUTROABS 7.2 8.4*  --   --   HGB 14.6 13.8 12.8* 11.6*  HCT 42.2 40.0 36.6* 35.6*  MCV 85.3 86.6 87.1 92.5  PLT 278 281 226 216   Cardiac Enzymes: No results for input(s): CKTOTAL, CKMB, CKMBINDEX, TROPONINI in the last 168 hours. BNP: Invalid input(s): POCBNP CBG:  Recent Labs Lab 09/18/15 0743 09/19/15 0737 09/20/15 0742  GLUCAP 88 118* 102*    No results found for this or any previous visit (from the past 240 hour(s)).   Scheduled Meds: . chlorhexidine  15 mL Mouth/Throat BID  . cloNIDine  0.1 mg Oral TID  . feeding supplement (ENSURE ENLIVE)  237 mL Oral BID BM  . FLUoxetine  20 mg Oral Daily  . folic acid  1 mg Oral Daily  . gabapentin  100 mg Oral TID  . ipratropium-albuterol  3 mL Nebulization BID  . metoprolol tartrate  25 mg Oral QAC breakfast  . multivitamin with minerals  1 tablet Oral Daily  . nicotine  21 mg Transdermal Daily  . nystatin  5 mL Oral QID  . pantoprazole  40 mg Oral BID AC  . predniSONE  50 mg Oral Q breakfast  . sodium chloride flush  3 mL Intravenous Q12H  . thiamine  100 mg Oral Daily   Or  . thiamine  100 mg Intravenous Daily  . traZODone  100 mg Oral QHS   Continuous Infusions: . sodium chloride 0.9 % 1,000 mL with potassium chloride 20 mEq infusion 75 mL/hr at 09/20/15 0109     Tera Pellicane, DO  [facility] Pager [phone]  If  7PM-7AM, please contact night-coverage [email or web address] Password [email or web address] 09/20/2015, 9:26 AM   LOS: 3 days

## 2015-09-20 NOTE — Progress Notes (Signed)
Patient ID: Samuel Reyes, male   DOB: 1956-05-16, 60 y.o.   MRN: [id number]  Psychiatric consult completed and spoke with Dr. Carles Collet. Patient is psychiatrically cleared for substance abuse rehabilitation treatment. Patient contract for safety. Please contact unit social service to provide psychosocial support and referral to Substance abuse rehab placement.     Sigmond Patalano,JANARDHAHA R. 09/20/2015 9:52 AM

## 2015-09-20 NOTE — Progress Notes (Signed)
CSW reviewed chart and Pt is cleared through psychiatry and contracted for safety.   CSW will meet with the Pt and provide Pt with Substance Abuse information for both inpt and IOP programs.   [place]  [phone]

## 2015-09-21 DIAGNOSIS — J9601 Acute respiratory failure with hypoxia: Secondary | ICD-10-CM

## 2015-09-21 DIAGNOSIS — F1020 Alcohol dependence, uncomplicated: Secondary | ICD-10-CM

## 2015-09-21 DIAGNOSIS — F10120 Alcohol abuse with intoxication, uncomplicated: Secondary | ICD-10-CM

## 2015-09-21 DIAGNOSIS — I10 Essential (primary) hypertension: Secondary | ICD-10-CM

## 2015-09-21 LAB — COMPREHENSIVE METABOLIC PANEL
ALBUMIN: 2.8 g/dL — AB (ref 3.5–5.0)
ALT: 17 U/L (ref 17–63)
AST: 15 U/L (ref 15–41)
Alkaline Phosphatase: 57 U/L (ref 38–126)
Anion gap: 9 (ref 5–15)
BUN: 9 mg/dL (ref 6–20)
CHLORIDE: 101 mmol/L (ref 101–111)
CO2: 26 mmol/L (ref 22–32)
CREATININE: 0.65 mg/dL (ref 0.61–1.24)
Calcium: 8.5 mg/dL — ABNORMAL LOW (ref 8.9–10.3)
GFR calc Af Amer: >60 mL/min (ref >60)
GLUCOSE: 89 mg/dL (ref 65–99)
POTASSIUM: 3.6 mmol/L (ref 3.5–5.1)
Sodium: 136 mmol/L (ref 135–145)
Total Bilirubin: 0.3 mg/dL (ref 0.3–1.2)
Total Protein: 6 g/dL — ABNORMAL LOW (ref 6.5–8.1)

## 2015-09-21 LAB — CBC
HEMATOCRIT: 35.4 % — AB (ref 39.0–52.0)
Hemoglobin: 11.4 g/dL — ABNORMAL LOW (ref 13.0–17.0)
MCH: 29.7 pg (ref 26.0–34.0)
MCHC: 32.2 g/dL (ref 30.0–36.0)
MCV: 92.2 fL (ref 78.0–100.0)
PLATELETS: 303 10*3/uL (ref 150–400)
RBC: 3.84 MIL/uL — AB (ref 4.22–5.81)
RDW: 15.8 % — AB (ref 11.5–15.5)
WBC: 9.1 10*3/uL (ref 4.0–10.5)

## 2015-09-21 LAB — MRSA PCR SCREENING: MRSA by PCR: NEGATIVE

## 2015-09-21 LAB — GLUCOSE, CAPILLARY: GLUCOSE-CAPILLARY: 101 mg/dL — AB (ref 65–99)

## 2015-09-21 MED ORDER — BUDESONIDE 0.25 MG/2ML IN SUSP: 0.2500 mg | Freq: Two times a day (BID) | RESPIRATORY_TRACT | Status: DC

## 2015-09-21 MED ORDER — ALBUTEROL SULFATE HFA 108 (90 BASE) MCG/ACT IN AERS: 1.0000 | INHALATION_SPRAY | RESPIRATORY_TRACT | Status: DC | PRN

## 2015-09-21 MED ORDER — ONDANSETRON HCL 4 MG PO TABS: 4.0000 mg | ORAL_TABLET | Freq: Four times a day (QID) | ORAL | Status: DC | PRN

## 2015-09-21 MED ORDER — IPRATROPIUM-ALBUTEROL 20-100 MCG/ACT IN AERS: 1.0000 | INHALATION_SPRAY | Freq: Four times a day (QID) | RESPIRATORY_TRACT | Status: DC | PRN

## 2015-09-21 MED ORDER — PREDNISONE 10 MG PO TABS: ORAL_TABLET | ORAL | Status: DC

## 2015-09-21 MED ORDER — PANTOPRAZOLE SODIUM 40 MG PO TBEC: 40.0000 mg | DELAYED_RELEASE_TABLET | Freq: Every day | ORAL | Status: DC

## 2015-09-21 NOTE — Progress Notes (Signed)
Physical Therapy Treatment Patient Details Name: Samuel Reyes MRN: [id number] DOB: 1955-11-21 Today's Date: 09/21/2015    History of Present Illness Pt admitted through ED as suicidal and with hx of ETOH abuse    PT Comments    Pt in bed with safety sitter in room.  Assisted with amb a greater distance in hallway with NO AD and NO LOB.  Good gait speed and only mild c/o dyspnea as pt stated he has there COPD.  RA 97%  Follow Up Recommendations  No PT follow up (per chart review seeking SubAbuse Bed)     Equipment Recommendations       Recommendations for Other Services       Precautions / Restrictions Precautions Precautions: Fall    Mobility  Bed Mobility Overal bed mobility: Modified Independent                Transfers Overall transfer level: Modified independent   Transfers: Sit to/from [employer] transfer comment: steady  Ambulation/Gait Ambulation/Gait assistance: Supervision;Min guard Ambulation Distance (Feet): 350 Feet Assistive device: 1 person hand held assist;None Gait Pattern/deviations: Step-through pattern;Decreased stride length Gait velocity: decreased   General Gait Details: good alternating gait.  No LOB.  Min c/o fatigue and dyspnea 1/3 RA 97% as pt stated he has the COPD.   Stairs            Wheelchair Mobility    Modified Rankin (Stroke Patients Only)       Balance                                    Cognition Arousal/Alertness: Awake/alert Behavior During Therapy: WFL for tasks assessed/performed Overall Cognitive Status: Within Functional Limits for tasks assessed                      Exercises      General Comments        Pertinent Vitals/Pain Pain Assessment: No/denies pain    Home Living                      Prior Function            PT Goals (current goals can now be found in the care plan section) Progress towards PT goals:  Progressing toward goals    Frequency  Min 3X/week    PT Plan Current plan remains appropriate    Co-evaluation             End of Session Equipment Utilized During Treatment: Gait belt Activity Tolerance: Patient tolerated treatment well Patient left: in bed;with nursing/sitter in room;with call bell/phone within reach     Time: 1418-1431 PT Time Calculation (min) (ACUTE ONLY): 13 min  Charges:  $Gait Training: 8-22 mins                    G Codes:      Rica Koyanagi  PTA [facility]  Acute  Rehab Pager      [phone]

## 2015-09-21 NOTE — Care Management Note (Signed)
Case Management Note  Patient Details  Name: Samuel Reyes MRN: [id number] Date of Birth: April 30, 1956  Subjective/Objective:      60 yo admitted with alcohol intoxication             Action/Plan: From hotel with friends  Expected Discharge Date:   (unknown)               Expected Discharge Plan:   (substance abuse facility)  In-House Referral:  Clinical Social Work  Discharge planning Services  CM Consult  Post Acute Care Choice:    Choice offered to:     DME Arranged:    DME Agency:     HH Arranged:    [place] Agency:     Status of Service:  Completed, signed off  Medicare Important Message Given:    Date Medicare IM Given:    Medicare IM give by:    Date Additional Medicare IM Given:    Additional Medicare Important Message give by:     If discussed at [place] of Stay Meetings, dates discussed:    Additional Comments: Chart reviewed and no CM needs identified or communicated at this time. CM will continue to follow. Marney Doctor RN,BSN,NCM [id number] Lynnell Catalan, RN 09/21/2015, 3:21 PM

## 2015-09-21 NOTE — Discharge Summary (Signed)
Physician Discharge Summary  Bachir Mable [id number] DOB: 1956/03/20 DOA: 09/17/2015  PCP: Lorayne Marek, MD  Admit date: 09/17/2015 Discharge date: 09/21/2015  Recommendations for Outpatient Follow-up:  1. Pt will need to follow up with PCP in 2-3 weeks post discharge 2. Please obtain BMP to evaluate electrolytes and kidney function 3. Please also check CBC to evaluate Hg and Hct levels 4. Outpatient rehab recommended to assist with alcohol cessation   Discharge Diagnoses:  Principal Problem:   Alcohol intoxication ([place]) Active Problems:   COPD (chronic obstructive pulmonary disease) (HCC)   GERD (gastroesophageal reflux disease)   Suicidal ideation   Depression   Benign essential HTN   Hypokalemia   Hyponatremia   Coffee ground emesis   Acute respiratory failure with hypoxia (HCC)   Alcoholism /alcohol abuse ([place])   COPD exacerbation ([place])  Discharge Condition: Stable  Diet recommendation: Heart healthy diet discussed in details   Brief HPI: 60 year old male with a history of Barrett's esophagus, alcohol abuse, hypertension, COPD presented to the emergency department with coffee grounds emesis and intoxicated. He was suicidal in the emergency department He was noted to have coffee grounds emesis during his stay in the emergency department. Alcohol level was 287. Hemoglobin and blood pressures have been stable. Gi team consulted and recommended PPI QD indefinitely.   Assessment/Plan: Coffee Grounds Emesis -pt with hx of Barrett's seen on EGD on 01/04/14 and Bilroth I bypass -secondary to Etoh and NSAIDS -continue Protonix QD per GI team  -Hemoglobin remains stable, hemodynamically stable -tolerating diet well  -Appreciate GI--no further intervention at this time  Acute respiratory failure with hypoxia/COPD exacerbation -oxygen saturation 88% on room air with increasing shortness of breath -stable on RA  Suicidal ideation -no harm to himself or others -per  psych not meeting criteria for inpatient psych [place] -outpatient rehab recommended   Alcohol intoxication with history of withdrawal -Last alcoholic beverage [id number] -Drinks 2 x 40oz beers daily; occasional liquor -Cessation discussed in detail   Hyponatremia -resolved with IVF  Hypokalemia -WNL  Hypertension -Continue home medical regimen   Depression -Continue Prozac and trazodone  Tobacco abuse -no desire to quit  Discharge Exam: Filed Vitals:   09/21/15 1327 09/21/15 2100  BP: 138/81 136/80  Pulse: 71 92  Temp: 98.7 F (37.1 C) 98.4 F (36.9 C)  Resp: 18 18   Filed Vitals:   09/21/15 0910 09/21/15 1220 09/21/15 1327 09/21/15 2100  BP:  133/78 138/81 136/80  Pulse:  78 71 92  Temp:  98.5 F (36.9 C) 98.7 F (37.1 C) 98.4 F (36.9 C)  TempSrc:  Oral Oral Oral  Resp:  18 18 18   Weight:      SpO2: 97% 97% 96% 98%    General: Pt is alert, follows commands appropriately, not in acute distress Cardiovascular: Regular rate and rhythm, no rubs, no gallops Respiratory: Clear to auscultation bilaterally, no wheezing, no crackles, no rhonchi Abdominal: Soft, non tender, non distended, bowel sounds +, no guarding Extremities: no edema, no cyanosis, pulses palpable bilaterally DP and PT Neuro: Grossly nonfocal  Discharge Instructions  Discharge Instructions    Diet - low sodium heart healthy    Complete by:  As directed      Increase activity slowly    Complete by:  As directed             Medication List    STOP taking these medications        GOODY HEADACHE PO  TAKE these medications        albuterol 108 (90 Base) MCG/ACT inhaler  Commonly known as:  PROVENTIL HFA;VENTOLIN HFA  Inhale 1-2 puffs into the lungs every 4 (four) hours as needed for wheezing.     budesonide 0.25 MG/2ML nebulizer solution  Commonly known as:  PULMICORT  Take 2 mLs (0.25 mg total) by nebulization 2 (two) times daily.     cloNIDine 0.2 MG tablet  Commonly  known as:  CATAPRES  Take 1 tablet (0.2 mg total) by mouth 3 (three) times daily.     FLUoxetine 20 MG capsule  Commonly known as:  PROZAC  Take 1 capsule (20 mg total) by mouth daily.     gabapentin 100 MG capsule  Commonly known as:  NEURONTIN  Take 1 capsule (100 mg total) by mouth 3 (three) times daily.     Ipratropium-Albuterol 20-100 MCG/ACT Aers respimat  Commonly known as:  COMBIVENT  Inhale 1 puff into the lungs every 6 (six) hours as needed for wheezing or shortness of breath.     metoprolol tartrate 25 MG tablet  Commonly known as:  LOPRESSOR  Take 1 tablet (25 mg total) by mouth daily before breakfast.     nystatin 100000 UNIT/ML suspension  Commonly known as:  MYCOSTATIN  Take 5 mLs (500,000 Units total) by mouth 4 (four) times daily.     ondansetron 4 MG tablet  Commonly known as:  ZOFRAN  Take 1 tablet (4 mg total) by mouth every 6 (six) hours as needed for nausea.     pantoprazole 40 MG tablet  Commonly known as:  PROTONIX  Take 1 tablet (40 mg total) by mouth daily.     predniSONE 10 MG tablet  Commonly known as:  DELTASONE  Take 40 mg tablet 2/23 and taper down by 10 mg daily until completed     traZODone 100 MG tablet  Commonly known as:  DESYREL  Take 1 tablet (100 mg total) by mouth at bedtime.           The results of significant diagnostics from this hospitalization (including imaging, microbiology, ancillary and laboratory) are listed below for reference.     Microbiology: Recent Results (from the past 240 hour(s))  MRSA PCR Screening     Status: None   Collection Time: 09/21/15  7:52 AM  Result Value Ref Range Status   MRSA by PCR NEGATIVE NEGATIVE Final    Comment:        The GeneXpert MRSA Assay (FDA approved for NASAL specimens only), is one component of a comprehensive MRSA colonization surveillance program. It is not intended to diagnose MRSA infection nor to guide or monitor treatment for MRSA infections.       Labs: Basic Metabolic Panel:  Recent Labs Lab 09/17/15 1925 09/18/15 0629 09/19/15 0553 09/20/15 0558 09/21/15 0538  NA 126* 126* 129* 135 136  K 2.7* 3.3* 3.2* 3.7 3.6  CL 76* 82* 91* 102 101  CO2 31 33* 31 26 26   GLUCOSE 68 86 99 97 89  BUN 8 11 8 7 9   CREATININE 0.73 0.85 0.62 0.62 0.65  CALCIUM 7.9* 7.8* 7.8* 8.2* 8.5*  MG 2.1 2.0 2.0  --   --   PHOS 2.9  --   --   --   --    Liver Function Tests:  Recent Labs Lab 09/17/15 1348 09/17/15 1925 09/21/15 0538  AST 51* 47* 15  ALT 36 31 17  ALKPHOS 89 85 57  BILITOT 0.3 0.5 0.3  PROT 7.7 7.0 6.0*  ALBUMIN 4.0 3.7 2.8*    Recent Labs Lab 09/17/15 1348  LIPASE 29   No results for input(s): AMMONIA in the last 168 hours. CBC:  Recent Labs Lab 09/17/15 1525 09/17/15 2020 09/18/15 0629 09/19/15 0553 09/21/15 0538  WBC 9.7 10.3 11.2* 10.4 9.1  NEUTROABS 7.2 8.4*  --   --   --   HGB 14.6 13.8 12.8* 11.6* 11.4*  HCT 42.2 40.0 36.6* 35.6* 35.4*  MCV 85.3 86.6 87.1 92.5 92.2  PLT 278 281 226 216 303   BNP (last 3 results)  Recent Labs  12/10/14 1339 01/04/15 1432 06/15/15 1325  BNP 46.5 20.9 41.3   CBG:  Recent Labs Lab 09/18/15 0743 09/19/15 0737 09/20/15 0742 09/21/15 0716  GLUCAP 88 118* 102* 101*   SIGNED: Time coordinating discharge: 30 minutes  MAGICK-Nelvin Tomb, MD  [facility] 09/21/2015, 10:58 PM Pager [phone]  If 7PM-7AM, please contact night-coverage [email or web address] Password [email or web address]

## 2015-09-22 ENCOUNTER — Inpatient Hospital Stay: Payer: Self-pay | Admitting: Family Medicine

## 2015-10-18 ENCOUNTER — Inpatient Hospital Stay: Payer: Self-pay | Admitting: Internal Medicine

## 2015-10-20 ENCOUNTER — Emergency Department (HOSPITAL_COMMUNITY)
Admission: EM | Admit: 2015-10-20 | Discharge: 2015-10-21 | Disposition: A | Discharge status: Home / Self Care | Payer: Medicaid Other | Attending: Emergency Medicine | Admitting: Emergency Medicine

## 2015-10-20 ENCOUNTER — Emergency Department (HOSPITAL_COMMUNITY): Payer: Medicaid Other

## 2015-10-20 ENCOUNTER — Encounter (HOSPITAL_COMMUNITY): Payer: Self-pay

## 2015-10-20 DIAGNOSIS — Y998 Other external cause status: Secondary | ICD-10-CM | POA: Diagnosis not present

## 2015-10-20 DIAGNOSIS — Z7951 Long term (current) use of inhaled steroids: Secondary | ICD-10-CM | POA: Diagnosis not present

## 2015-10-20 DIAGNOSIS — Z043 Encounter for examination and observation following other accident: Secondary | ICD-10-CM | POA: Diagnosis not present

## 2015-10-20 DIAGNOSIS — Y92410 Unspecified street and highway as the place of occurrence of the external cause: Secondary | ICD-10-CM | POA: Diagnosis not present

## 2015-10-20 DIAGNOSIS — F10920 Alcohol use, unspecified with intoxication, uncomplicated: Secondary | ICD-10-CM

## 2015-10-20 DIAGNOSIS — Z880 Allergy status to penicillin: Secondary | ICD-10-CM | POA: Insufficient documentation

## 2015-10-20 DIAGNOSIS — R4182 Altered mental status, unspecified: Secondary | ICD-10-CM | POA: Diagnosis not present

## 2015-10-20 DIAGNOSIS — Y9389 Activity, other specified: Secondary | ICD-10-CM | POA: Diagnosis not present

## 2015-10-20 DIAGNOSIS — Z79899 Other long term (current) drug therapy: Secondary | ICD-10-CM | POA: Insufficient documentation

## 2015-10-20 DIAGNOSIS — S0993XA Unspecified injury of face, initial encounter: Secondary | ICD-10-CM | POA: Insufficient documentation

## 2015-10-20 DIAGNOSIS — F10120 Alcohol abuse with intoxication, uncomplicated: Secondary | ICD-10-CM | POA: Diagnosis not present

## 2015-10-20 DIAGNOSIS — F17210 Nicotine dependence, cigarettes, uncomplicated: Secondary | ICD-10-CM | POA: Insufficient documentation

## 2015-10-20 DIAGNOSIS — F10129 Alcohol abuse with intoxication, unspecified: Secondary | ICD-10-CM | POA: Diagnosis present

## 2015-10-20 DIAGNOSIS — W19XXXA Unspecified fall, initial encounter: Secondary | ICD-10-CM

## 2015-10-20 NOTE — Discharge Instructions (Signed)
Alcohol Intoxication  Alcohol intoxication occurs when the amount of alcohol that a person has consumed impairs his or her ability to mentally and physically function. Alcohol directly impairs the normal chemical activity of the brain. Drinking large amounts of alcohol can lead to changes in mental function and behavior, and it can cause many physical effects that can be harmful.   Alcohol intoxication can range in severity from mild to very severe. Various factors can affect the level of intoxication that occurs, such as the person's age, gender, weight, frequency of alcohol consumption, and the presence of other medical conditions (such as diabetes, seizures, or heart conditions). Dangerous levels of alcohol intoxication may occur when people drink large amounts of alcohol in a short period (binge drinking). Alcohol can also be especially dangerous when combined with certain prescription medicines or "recreational" drugs.  SIGNS AND SYMPTOMS  Some common signs and symptoms of mild alcohol intoxication include:  · Loss of coordination.  · Changes in mood and behavior.  · Impaired judgment.  · Slurred speech.  As alcohol intoxication progresses to more severe levels, other signs and symptoms will appear. These may include:  · Vomiting.  · Confusion and impaired memory.  · Slowed breathing.  · Seizures.  · Loss of consciousness.  DIAGNOSIS   Your health care provider will take a medical history and perform a physical exam. You will be asked about the amount and type of alcohol you have consumed. Blood tests will be done to measure the concentration of alcohol in your blood. In many places, your blood alcohol level must be lower than 80 mg/dL (0.08%) to legally drive. However, many dangerous effects of alcohol can occur at much lower levels.   TREATMENT   People with alcohol intoxication often do not require treatment. Most of the effects of alcohol intoxication are temporary, and they go away as the alcohol naturally  leaves the body. Your health care provider will monitor your condition until you are stable enough to go home. Fluids are sometimes given through an IV access tube to help prevent dehydration.   HOME CARE INSTRUCTIONS  · Do not drive after drinking alcohol.  · Stay hydrated. Drink enough water and fluids to keep your urine clear or pale yellow. Avoid caffeine.    · Only take over-the-counter or prescription medicines as directed by your health care provider.    SEEK MEDICAL CARE IF:   · You have persistent vomiting.    · You do not feel better after a few days.  · You have frequent alcohol intoxication. Your health care provider can help determine if you should see a substance use treatment counselor.  SEEK IMMEDIATE MEDICAL CARE IF:   · You become shaky or tremble when you try to stop drinking.    · You shake uncontrollably (seizure).    · You throw up (vomit) blood. This may be bright red or may look like black coffee grounds.    · You have blood in your stool. This may be bright red or may appear as a black, tarry, bad smelling stool.    · You become lightheaded or faint.    MAKE SURE YOU:   · Understand these instructions.  · Will watch your condition.  · Will get help right away if you are not doing well or get worse.     This information is not intended to replace advice given to you by your health care provider. Make sure you discuss any questions you have with your health care provider.       Document Released: 04/25/2005 Document Revised: 03/18/2013 Document Reviewed: 12/19/2012  Elsevier Interactive Patient Education ©2016 [employer]
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
## 2015-10-28 ENCOUNTER — Emergency Department (HOSPITAL_COMMUNITY)
Admission: EM | Admit: 2015-10-28 | Discharge: 2015-10-29 | Disposition: A | Discharge status: Other Acute Inpatient Hospital | Payer: Medicaid Other | Attending: Emergency Medicine | Admitting: Emergency Medicine

## 2015-10-28 ENCOUNTER — Emergency Department (HOSPITAL_COMMUNITY): Payer: Medicaid Other

## 2015-10-28 ENCOUNTER — Encounter (HOSPITAL_COMMUNITY): Payer: Self-pay

## 2015-10-28 DIAGNOSIS — E876 Hypokalemia: Secondary | ICD-10-CM | POA: Diagnosis not present

## 2015-10-28 DIAGNOSIS — R000 Tachycardia, unspecified: Secondary | ICD-10-CM | POA: Diagnosis not present

## 2015-10-28 DIAGNOSIS — R0789 Other chest pain: Secondary | ICD-10-CM | POA: Insufficient documentation

## 2015-10-28 DIAGNOSIS — Z79899 Other long term (current) drug therapy: Secondary | ICD-10-CM | POA: Diagnosis not present

## 2015-10-28 DIAGNOSIS — F1010 Alcohol abuse, uncomplicated: Secondary | ICD-10-CM | POA: Diagnosis not present

## 2015-10-28 DIAGNOSIS — Z880 Allergy status to penicillin: Secondary | ICD-10-CM | POA: Insufficient documentation

## 2015-10-28 DIAGNOSIS — Z7951 Long term (current) use of inhaled steroids: Secondary | ICD-10-CM | POA: Insufficient documentation

## 2015-10-28 DIAGNOSIS — J449 Chronic obstructive pulmonary disease, unspecified: Secondary | ICD-10-CM | POA: Diagnosis not present

## 2015-10-28 DIAGNOSIS — R45851 Suicidal ideations: Secondary | ICD-10-CM | POA: Diagnosis present

## 2015-10-28 DIAGNOSIS — F17210 Nicotine dependence, cigarettes, uncomplicated: Secondary | ICD-10-CM | POA: Insufficient documentation

## 2015-10-28 LAB — RAPID URINE DRUG SCREEN, HOSP PERFORMED
Amphetamines: NOT DETECTED
Barbiturates: NOT DETECTED
Benzodiazepines: NOT DETECTED
Cocaine: NOT DETECTED
Opiates: NOT DETECTED
Tetrahydrocannabinol: NOT DETECTED

## 2015-10-28 LAB — BASIC METABOLIC PANEL WITH GFR
Anion gap: 13 (ref 5–15)
BUN: <5 mg/dL — ABNORMAL LOW (ref 6–20)
CO2: 26 mmol/L (ref 22–32)
Calcium: 8.3 mg/dL — ABNORMAL LOW (ref 8.9–10.3)
Chloride: 93 mmol/L — ABNORMAL LOW (ref 101–111)
Creatinine, Ser: 0.86 mg/dL (ref 0.61–1.24)
GFR calc Af Amer: >60 mL/min
GFR calc non Af Amer: >60 mL/min
Glucose, Bld: 77 mg/dL (ref 65–99)
Potassium: 2.9 mmol/L — ABNORMAL LOW (ref 3.5–5.1)
Sodium: 132 mmol/L — ABNORMAL LOW (ref 135–145)

## 2015-10-28 LAB — URINALYSIS, ROUTINE W REFLEX MICROSCOPIC
Bilirubin Urine: NEGATIVE
Glucose, UA: NEGATIVE mg/dL
Hgb urine dipstick: NEGATIVE
Ketones, ur: NEGATIVE mg/dL
Leukocytes, UA: NEGATIVE
Nitrite: NEGATIVE
Protein, ur: NEGATIVE mg/dL
Specific Gravity, Urine: 1.004 — ABNORMAL LOW (ref 1.005–1.030)
pH: 6 (ref 5.0–8.0)

## 2015-10-28 LAB — CBC
HCT: 39.6 % (ref 39.0–52.0)
Hemoglobin: 12.9 g/dL — ABNORMAL LOW (ref 13.0–17.0)
MCH: 27.5 pg (ref 26.0–34.0)
MCHC: 32.6 g/dL (ref 30.0–36.0)
MCV: 84.4 fL (ref 78.0–100.0)
Platelets: 153 K/uL (ref 150–400)
RBC: 4.69 MIL/uL (ref 4.22–5.81)
RDW: 16.1 % — ABNORMAL HIGH (ref 11.5–15.5)
WBC: 6.7 K/uL (ref 4.0–10.5)

## 2015-10-28 LAB — I-STAT TROPONIN, ED: Troponin i, poc: 0 ng/mL (ref 0.00–0.08)

## 2015-10-28 LAB — ACETAMINOPHEN LEVEL: Acetaminophen (Tylenol), Serum: <10 ug/mL — ABNORMAL LOW (ref 10–30)

## 2015-10-28 LAB — SALICYLATE LEVEL: Salicylate Lvl: <4 mg/dL (ref 2.8–30.0)

## 2015-10-28 LAB — ETHANOL: Alcohol, Ethyl (B): 209 mg/dL — ABNORMAL HIGH

## 2015-10-28 MED ORDER — ALBUTEROL SULFATE HFA 108 (90 BASE) MCG/ACT IN AERS: 1.0000 | INHALATION_SPRAY | RESPIRATORY_TRACT | Status: DC | PRN

## 2015-10-28 MED ORDER — IPRATROPIUM-ALBUTEROL 20-100 MCG/ACT IN AERS: 1.0000 | INHALATION_SPRAY | Freq: Four times a day (QID) | RESPIRATORY_TRACT | Status: DC | PRN

## 2015-10-28 MED ORDER — FOLIC ACID 1 MG PO TABS
1.0000 mg | ORAL_TABLET | Freq: Once | ORAL | Status: AC
  Administered 2015-10-28: 1 mg via ORAL
  Filled 2015-10-28: qty 1

## 2015-10-28 MED ORDER — LORAZEPAM 1 MG PO TABS
0.0000 mg | ORAL_TABLET | Freq: Four times a day (QID) | ORAL | Status: DC
  Administered 2015-10-28 – 2015-10-29 (×2): 1 mg via ORAL
  Filled 2015-10-28 (×2): qty 1

## 2015-10-28 MED ORDER — SODIUM CHLORIDE 0.9 % IV BOLUS (SEPSIS)
1000.0000 mL | Freq: Once | INTRAVENOUS | Status: AC
  Administered 2015-10-28: 1000 mL via INTRAVENOUS

## 2015-10-28 MED ORDER — THIAMINE HCL 100 MG/ML IJ SOLN: 100.0000 mg | Freq: Every day | INTRAMUSCULAR | Status: DC

## 2015-10-28 MED ORDER — ONDANSETRON HCL 4 MG PO TABS
4.0000 mg | ORAL_TABLET | Freq: Four times a day (QID) | ORAL | Status: DC | PRN
  Administered 2015-10-28: 4 mg via ORAL
  Filled 2015-10-28: qty 1

## 2015-10-28 MED ORDER — BUDESONIDE 0.25 MG/2ML IN SUSP
0.2500 mg | Freq: Two times a day (BID) | RESPIRATORY_TRACT | Status: DC
  Administered 2015-10-29: 0.25 mg via RESPIRATORY_TRACT
  Filled 2015-10-28 (×2): qty 2

## 2015-10-28 MED ORDER — ONDANSETRON HCL 4 MG/2ML IJ SOLN
4.0000 mg | Freq: Once | INTRAMUSCULAR | Status: AC
  Administered 2015-10-28: 4 mg via INTRAVENOUS
  Filled 2015-10-28: qty 2

## 2015-10-28 MED ORDER — CLONIDINE HCL 0.2 MG PO TABS
0.2000 mg | ORAL_TABLET | Freq: Three times a day (TID) | ORAL | Status: DC
  Administered 2015-10-28 – 2015-10-29 (×2): 0.2 mg via ORAL
  Filled 2015-10-28 (×2): qty 1

## 2015-10-28 MED ORDER — LORAZEPAM 1 MG PO TABS: 0.0000 mg | ORAL_TABLET | Freq: Two times a day (BID) | ORAL | Status: DC

## 2015-10-28 MED ORDER — TRAZODONE HCL 100 MG PO TABS
100.0000 mg | ORAL_TABLET | Freq: Every day | ORAL | Status: DC
  Administered 2015-10-28: 100 mg via ORAL
  Filled 2015-10-28: qty 1

## 2015-10-28 MED ORDER — LORAZEPAM 2 MG/ML IJ SOLN
1.0000 mg | Freq: Once | INTRAMUSCULAR | Status: AC
  Administered 2015-10-28: 1 mg via INTRAVENOUS
  Filled 2015-10-28: qty 1

## 2015-10-28 MED ORDER — POTASSIUM CHLORIDE CRYS ER 20 MEQ PO TBCR
40.0000 meq | EXTENDED_RELEASE_TABLET | Freq: Once | ORAL | Status: AC
  Administered 2015-10-28: 40 meq via ORAL
  Filled 2015-10-28: qty 2

## 2015-10-28 MED ORDER — METOPROLOL TARTRATE 25 MG PO TABS
25.0000 mg | ORAL_TABLET | Freq: Every day | ORAL | Status: DC
  Administered 2015-10-29: 25 mg via ORAL
  Filled 2015-10-28: qty 1

## 2015-10-28 MED ORDER — VITAMIN B-1 100 MG PO TABS
100.0000 mg | ORAL_TABLET | Freq: Every day | ORAL | Status: DC
  Administered 2015-10-28 – 2015-10-29 (×2): 100 mg via ORAL
  Filled 2015-10-28 (×2): qty 1

## 2015-10-28 MED ORDER — ACETAMINOPHEN 325 MG PO TABS
650.0000 mg | ORAL_TABLET | Freq: Once | ORAL | Status: AC
  Administered 2015-10-28: 650 mg via ORAL
  Filled 2015-10-28: qty 2

## 2015-10-28 MED ORDER — PANTOPRAZOLE SODIUM 40 MG PO TBEC
40.0000 mg | DELAYED_RELEASE_TABLET | Freq: Every day | ORAL | Status: DC
  Administered 2015-10-29: 40 mg via ORAL
  Filled 2015-10-28: qty 1

## 2015-10-28 MED ORDER — LORAZEPAM 2 MG/ML IJ SOLN
0.0000 mg | Freq: Four times a day (QID) | INTRAMUSCULAR | Status: DC
  Administered 2015-10-29: 1 mg via INTRAVENOUS
  Filled 2015-10-28: qty 1

## 2015-10-28 MED ORDER — FLUOXETINE HCL 20 MG PO CAPS
20.0000 mg | ORAL_CAPSULE | Freq: Every day | ORAL | Status: DC
  Administered 2015-10-29: 20 mg via ORAL
  Filled 2015-10-28: qty 1

## 2015-10-28 MED ORDER — GABAPENTIN 100 MG PO CAPS
100.0000 mg | ORAL_CAPSULE | Freq: Three times a day (TID) | ORAL | Status: DC
  Administered 2015-10-28 – 2015-10-29 (×2): 100 mg via ORAL
  Filled 2015-10-28 (×2): qty 1

## 2015-10-28 MED ORDER — IPRATROPIUM-ALBUTEROL 0.5-2.5 (3) MG/3ML IN SOLN: 3.0000 mL | Freq: Four times a day (QID) | RESPIRATORY_TRACT | Status: DC | PRN

## 2015-10-28 MED ORDER — LORAZEPAM 2 MG/ML IJ SOLN: 0.0000 mg | Freq: Two times a day (BID) | INTRAMUSCULAR | Status: DC

## 2015-10-28 NOTE — ED Notes (Signed)
TTS completed. They have recommended in-patient treatment

## 2015-10-28 NOTE — ED Provider Notes (Signed)
CSN: [id number]     Arrival date & time 10/28/15  1308 History   First MD Initiated Contact with Patient 10/28/15 1318     No chief complaint on file.    (Consider location/radiation/quality/duration/timing/severity/associated sxs/prior Treatment) HPI   Samuel Reyes is a 60 year old male with past medical history of COPD, alcohol abuse who presents to the ED complaining of suicidal ideation. Level V caveat, intoxication. Patient reports that since his son and his wife died 3 years ago that he does not want to live anymore. Patient states the last 24 hours he has had 9 40 ounce beers. Patient states that he attempted suicide 2 weeks ago by overdosing on trazodone but all it did was make him sick. He has a history of cutting. Denies homicidal ideation or hallucinations. Patient also complains of left-sided chest pain, intermittent over the last 2 weeks. Pain does not radiate and is 6/10. Pain is worsened with "pressing down on my chest". Patient also reports DOE, however he states this is baseline as he has COPD. Denies tinnitus, syncope, weakness, nausea, abdominal pain, blurry vision. No personal history of CAD. Patient is a tobacco smoker.   History reviewed. No pertinent past medical history. Past Surgical History  Procedure Laterality Date  . Gastrectomy    . Shoulder surgery Bilateral     3 surgeries on on left, 2 surgeries on right   . Rt knee arthroscopic surgery    . Back surgery      3 cervical spine surgeries C4-C5 fused  . Hernia repair    . Finger surgery Left     2nd, 3rd, & 4th fingers were cut off by table saw and reattached  . Colonoscopy N/A 01/04/2014    Procedure: COLONOSCOPY;  Surgeon: Danie Binder, MD;  [facility];  Service: Endoscopy;  Laterality: N/A;  1:45  . Esophagogastroduodenoscopy N/A 01/04/2014    Procedure: ESOPHAGOGASTRODUODENOSCOPY (EGD);  Surgeon: Danie Binder, MD;  [facility];  Service: Endoscopy;  Laterality: N/A;  .  Incisional hernia repair N/A 01/20/2014    Procedure: LAPAROSCOPIC RECURRENT  INCISIONAL HERNIA with mesh;  Surgeon: Edward Jolly, MD;  Location: [facility];  Service: General;  Laterality: N/A;   Family History  Problem Relation Age of Onset  . Cancer Father     bone  . Cancer Brother     lungs  . Stroke Maternal Grandmother   . Colon cancer Neg Hx   . Asthma Son     died at age 40 in his sleep   . Spina bifida Son     died at age 3    Social History  Substance Use Topics  . Smoking status: Current Every Day Smoker -- 1.00 packs/day for 45 years    Types: Cigarettes    Start date: 07/30/1966  . Smokeless tobacco: Never Used  . Alcohol Use: Yes     Comment: 4-40oz today 1/2gallon vodka a month. 5 to 6 cases a beer a month.    Review of Systems  All other systems reviewed and are negative.     Allergies  Bee venom and Penicillins  Home Medications   Prior to Admission medications   Medication Sig Start Date End Date Taking? Authorizing Provider  albuterol (PROVENTIL HFA;VENTOLIN HFA) 108 (90 Base) MCG/ACT inhaler Inhale 1-2 puffs into the lungs every 4 (four) hours as needed for wheezing. 09/21/15  Yes Theodis Blaze, MD  budesonide (PULMICORT) 0.25 MG/2ML nebulizer solution Take 2 mLs (  0.25 mg total) by nebulization 2 (two) times daily. 09/21/15  Yes Theodis Blaze, MD  cloNIDine (CATAPRES) 0.2 MG tablet Take 1 tablet (0.2 mg total) by mouth 3 (three) times daily. 09/06/15  Yes Nita Sells, MD  FLUoxetine (PROZAC) 20 MG capsule Take 1 capsule (20 mg total) by mouth daily. 06/04/15  Yes Derrill Center, NP  gabapentin (NEURONTIN) 100 MG capsule Take 1 capsule (100 mg total) by mouth 3 (three) times daily. 09/06/15  Yes Nita Sells, MD  Ipratropium-Albuterol (COMBIVENT) 20-100 MCG/ACT AERS respimat Inhale 1 puff into the lungs every 6 (six) hours as needed for wheezing or shortness of breath. 09/21/15  Yes Theodis Blaze, MD  metoprolol tartrate (LOPRESSOR) 25 MG  tablet Take 1 tablet (25 mg total) by mouth daily before breakfast. 06/04/15  Yes Derrill Center, NP  pantoprazole (PROTONIX) 40 MG tablet Take 1 tablet (40 mg total) by mouth daily. 09/21/15  Yes Theodis Blaze, MD  traZODone (DESYREL) 100 MG tablet Take 1 tablet (100 mg total) by mouth at bedtime. 09/06/15  Yes Nita Sells, MD  nystatin (MYCOSTATIN) 100000 UNIT/ML suspension Take 5 mLs (500,000 Units total) by mouth 4 (four) times daily. Patient not taking: Reported on 10/28/2015 09/07/15   Nita Sells, MD  ondansetron (ZOFRAN) 4 MG tablet Take 1 tablet (4 mg total) by mouth every 6 (six) hours as needed for nausea. 09/21/15   Theodis Blaze, MD  predniSONE (DELTASONE) 10 MG tablet Take 40 mg tablet 2/23 and taper down by 10 mg daily until completed Patient not taking: Reported on 10/28/2015 09/21/15   Theodis Blaze, MD   BP 102/71 mmHg  Pulse 99  Temp(Src) 98.3 F (36.8 C) (Oral)  Resp 22  Ht 5\' 4"  (1.626 m)  Wt 72.122 kg  BMI 27.28 kg/m2 Physical Exam  Constitutional: He is oriented to person, place, and time. He appears well-developed and well-nourished. No distress.  Patient appears intoxicated. Smells of alcohol.  HENT:  Head: Normocephalic and atraumatic.  Mouth/Throat: No oropharyngeal exudate.  Eyes: Conjunctivae and EOM are normal. Pupils are equal, round, and reactive to light. Right eye exhibits no discharge. Left eye exhibits no discharge. No scleral icterus.  Neck: Neck supple.  Cardiovascular: Normal rate, regular rhythm, normal heart sounds and intact distal pulses.  Exam reveals no gallop and no friction rub.   No murmur heard. Pulmonary/Chest: Effort normal and breath sounds normal. No respiratory distress. He has no wheezes. He has no rales. He exhibits tenderness ( Left anterior chest wall TTP).  Abdominal: Soft. He exhibits no distension. There is no tenderness. There is no guarding.  Musculoskeletal: Normal range of motion. He exhibits no edema.   Lymphadenopathy:    He has no cervical adenopathy.  Neurological: He is alert and oriented to person, place, and time. No cranial nerve deficit.  Strength 5/5 throughout. No sensory deficits.  No gait abnormality  Skin: Skin is warm and dry. No rash noted. He is not diaphoretic. No erythema. No pallor.  Psychiatric: He has a normal mood and affect. His behavior is normal.  Nursing note and vitals reviewed.   ED Course  Procedures (including critical care time) Labs Review Labs Reviewed  CBC - Abnormal; Notable for the following:    Hemoglobin 12.9 (*)    RDW 16.1 (*)    All other components within normal limits  BASIC METABOLIC PANEL  ETHANOL  ACETAMINOPHEN LEVEL  SALICYLATE LEVEL  URINALYSIS, ROUTINE W REFLEX MICROSCOPIC (NOT AT [facility])  URINE RAPID DRUG SCREEN, HOSP PERFORMED  I-STAT [place], ED    Imaging Review Dg Chest 2 View  10/28/2015  CLINICAL DATA:  Chest pain and COPD EXAM: CHEST  2 VIEW COMPARISON:  09/20/2015 chest radiograph. FINDINGS: Surgical clips overlie the region of the esophagogastric junction. Stable cardiomediastinal silhouette with normal heart size. No pneumothorax. No pleural effusion. Borderline hyperinflated lungs. No pulmonary edema. No acute consolidative airspace disease. IMPRESSION: Borderline hyperinflated lungs, which is compatible with the provided history of COPD. Otherwise no active disease in the chest. Electronically Signed   By: Ilona Sorrel M.D.   On: 10/28/2015 14:51   I have personally reviewed and evaluated these images and lab results as part of my medical decision-making.   EKG Interpretation   Date/Time:  Friday October 28 2015 13:10:48 EDT Ventricular Rate:  101 PR Interval:  129 QRS Duration: 102 QT Interval:  371 QTC Calculation: 481 R Axis:   76 Text Interpretation:  Sinus tachycardia Abnormal R-wave progression, early  transition Borderline prolonged QT interval Confirmed by Lacinda Axon  MD, BRIAN  [phone]) on 10/28/2015 1:39:47  PM Also confirmed by Lacinda Axon  MD, BRIAN ([id number])   on 10/28/2015 1:59:24 PM      MDM   Final diagnoses:  Suicidal ideation  Alcohol abuse   60 year old male with history of COPD presents to the ED complaint suicidal ideation. Patient appears clinically intoxicated and reports drinking 9 40 ounce beers in the last 24 hours. He states he does not want live anymore. Patient also complains of chest pain, intermittent for 2 weeks. CP is reproducible on exam with palpation. EKG reveals sinus tachycardia, no signs of ischemia. Troponin is 0.0. Chest x-ray reveals findings consistent with COPD. Low suspicion ACS. Potassium low at 2.9. Repleted in ED. Patient given 500 mL bolus NS. Patient placed on CIWA protocol. Thiamine and folic acid replacement. Patient is medically clear. Will await TTS consult.  TTS recommends inpatient therapy. Patient is currently hemodynamically stable and awaiting bed placement.  Patient was discussed with and seen by Dr. Lacinda Axon who agrees with the treatment plan.       Dondra Spry Whitaker, PA-C 10/28/15 1643  [name], MD 10/28/15 [phone]
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
## 2015-10-28 NOTE — BH Assessment (Signed)
Tele Assessment Note   Samuel Reyes is an 60 y.o. male who came to the Emergency Department due to severe alcohol abuse and depression with some suicidal ideations. He states that he "doesn't want to live anymore". When asked if he wanted to kill himself he states "I tried that, it didn't work." He states that he tried to slit his wrists 4 years ago and overdosed on medication 2 years ago. He was not admitted inpatient either of these times. He was admitted to [facility] in 2016 for alcohol dependence. He states that he started drinking when he was 80 after his first son died and now drinks 5-9 40oz beers daily. He states his last drink was at 9am this morning. He reports that he has bad withdrawal symptoms and is starting to "get the shakes". He states that he also lost his other son 6 years ago and his wife 3 years ago. He says that he has been having a lot of depression since then and just "wants to die" to not be in pain anymore. He denies HI or A/V hallucinations.   Per Samuel Ala NP pt meets inpatient criteria.   Diagnosis: Major Depressive Disorder Recurrent Severe, Alcohol use disorder severe   Past Medical History: History reviewed. No pertinent past medical history.  Past Surgical History  Procedure Laterality Date  . Gastrectomy    . Shoulder surgery Bilateral     3 surgeries on on left, 2 surgeries on right   . Rt knee arthroscopic surgery    . Back surgery      3 cervical spine surgeries C4-C5 fused  . Hernia repair    . Finger surgery Left     2nd, 3rd, & 4th fingers were cut off by table saw and reattached  . Colonoscopy N/A 01/04/2014    Procedure: COLONOSCOPY;  Surgeon: Danie Binder, MD;  [facility];  Service: Endoscopy;  Laterality: N/A;  1:45  . Esophagogastroduodenoscopy N/A 01/04/2014    Procedure: ESOPHAGOGASTRODUODENOSCOPY (EGD);  Surgeon: Danie Binder, MD;  [facility];  Service: Endoscopy;  Laterality: N/A;  . Incisional hernia repair N/A  01/20/2014    Procedure: LAPAROSCOPIC RECURRENT  INCISIONAL HERNIA with mesh;  Surgeon: Edward Jolly, MD;  Location: [facility];  Service: General;  Laterality: N/A;    Family History:  Family History  Problem Relation Age of Onset  . Cancer Father     bone  . Cancer Brother     lungs  . Stroke Maternal Grandmother   . Colon cancer Neg Hx   . Asthma Son     died at age 12 in his sleep   . Spina bifida Son     died at age 8     Social History:  reports that he has been smoking Cigarettes.  He started smoking about 49 years ago. He has a 45 pack-year smoking history. He has never used smokeless tobacco. He reports that he drinks alcohol. He reports that he does not use illicit drugs.  Additional Social History:  Alcohol / Drug Use History of alcohol / drug use?: Yes Withdrawal Symptoms: Cramps, Tremors, Weakness Substance #1 Name of Substance 1: Alcohol  1 - Age of First Use: 17 1 - Amount (size/oz): 5-9 40oz beers  1 - Frequency: daily  1 - Duration: on and off since age 12 1 - Last Use / Amount: 9am this morning  CIWA: CIWA-Ar BP: 128/74 mmHg Pulse Rate: 97 COWS:  PATIENT STRENGTHS: (choose at least two) Average or above average intelligence General fund of knowledge  Allergies:  Allergies  Allergen Reactions  . Bee Venom Anaphylaxis  . Penicillins Rash    Has patient had a PCN reaction causing immediate rash, facial/tongue/throat swelling, SOB or lightheadedness with hypotension: {Yes Has patient had a PCN reaction causing severe rash involving mucus membranes or skin necrosis: NO Has patient had a PCN reaction that required hospitalization {Yes Has patient had a PCN reaction occurring within the last 10 years: NO If all of the above answers are "NO", then may proceed with Cephalosporin use.     Home Medications:  (Not in a hospital admission)  OB/GYN Status:  No LMP for male patient.  General Assessment Data Location of Assessment: [facility] ED TTS  Assessment: In system Is this a Tele or Face-to-Face Assessment?: Face-to-Face Is this an Initial Assessment or a Re-assessment for this encounter?: Initial Assessment Marital status: Widowed Is patient pregnant?: No Pregnancy Status: No Living Arrangements: Alone Can pt return to current living arrangement?: Yes Admission Status: Voluntary Is patient capable of signing voluntary admission?: Yes Referral Source: Self/Family/Friend Insurance type:  Banker)     Crisis Care Plan Living Arrangements: Alone  Education Status Is patient currently in school?: No  Risk to self with the past 6 months Suicidal Ideation: Yes-Currently Present Has patient been a risk to self within the past 6 months prior to admission? : Yes Suicidal Intent: No Has patient had any suicidal intent within the past 6 months prior to admission? : No Is patient at risk for suicide?: Yes Suicidal Plan?: No Has patient had any suicidal plan within the past 6 months prior to admission? : No Access to Means: No What has been your use of drugs/alcohol within the last 12 months?: using alcohol daily  Previous Attempts/Gestures: Yes How many times?: 2 Other Self Harm Risks: alcoholism  Triggers for Past Attempts: Other (Comment) (loss of family members) Intentional Self Injurious Behavior: None Family Suicide History: Yes (step sister committed suicide) Recent stressful life event(s): Loss (Comment) Persecutory voices/beliefs?: No Depression: Yes Depression Symptoms: Despondent, Insomnia, Loss of interest in usual pleasures, Feeling worthless/self pity Substance abuse history and/or treatment for substance abuse?: Yes Suicide prevention information given to non-admitted patients: Not applicable  Risk to Others within the past 6 months Homicidal Ideation: No Does patient have any lifetime risk of violence toward others beyond the six months prior to admission? : No Thoughts of Harm to Others: No Current  Homicidal Intent: No Current Homicidal Plan: No Access to Homicidal Means: No Identified Victim: no History of harm to others?: No Assessment of Violence: None Noted Violent Behavior Description: none Does patient have access to weapons?: No Criminal Charges Pending?: No Does patient have a court date: No Is patient on probation?: No  Psychosis Hallucinations: None noted Delusions: None noted  Mental Status Report Appearance/Hygiene: Disheveled Eye Contact: Fair Motor Activity: Freedom of movement Speech: Logical/coherent Level of Consciousness: Alert Mood: Depressed Affect: Appropriate to circumstance Anxiety Level: Moderate Thought Processes: Coherent Judgement: Partial Orientation: Person, Place, Time, Situation Obsessive Compulsive Thoughts/Behaviors: None  Cognitive Functioning Concentration: Decreased Memory: Recent Intact, Remote Intact IQ: Average Insight: Fair Impulse Control: Poor Appetite: Poor (hasn't eaten in 3 days ) Weight Loss: 0 Weight Gain: 0 Sleep: Decreased Total Hours of Sleep: 3  ADLScreening [facility] Assessment Services) Patient's cognitive ability adequate to safely complete daily activities?: Yes Patient able to express need for assistance with ADLs?: Yes Independently performs ADLs?:  Yes (appropriate for developmental age)  Prior Inpatient Therapy Prior Inpatient Therapy: Yes Prior Therapy Dates: 2015 Prior Therapy Facilty/Provider(s): [facility] Reason for Treatment: Substance abuse  Prior Outpatient Therapy Prior Outpatient Therapy: Yes Prior Therapy Dates: ongoing Prior Therapy Facilty/Provider(s): [facility] Reason for Treatment: Depression Does patient have an ACCT team?: No Does patient have Intensive In-House Services?  : No Does patient have [facility] services? : Yes Does patient have P4CC services?: No  ADL Screening (condition at time of admission) Patient's cognitive ability adequate to safely complete daily activities?: Yes Is  the patient deaf or have difficulty hearing?: No Does the patient have difficulty seeing, even when wearing glasses/contacts?: No Does the patient have difficulty concentrating, remembering, or making decisions?: No Patient able to express need for assistance with ADLs?: Yes Does the patient have difficulty dressing or bathing?: No Independently performs ADLs?: Yes (appropriate for developmental age) Does the patient have difficulty walking or climbing stairs?: No Weakness of Legs: None Weakness of Arms/Hands: None  Home Assistive Devices/Equipment Home Assistive Devices/Equipment: None  Therapy Consults (therapy consults require a physician order) PT Evaluation Needed: No OT Evalulation Needed: No SLP Evaluation Needed: No Abuse/Neglect Assessment (Assessment to be complete while patient is alone) Physical Abuse: Denies Verbal Abuse: Denies Sexual Abuse: Denies Exploitation of patient/patient's resources: Denies Self-Neglect: Denies Values / Beliefs Cultural Requests During Hospitalization: None Spiritual Requests During Hospitalization: None Consults Spiritual Care Consult Needed: No Social Work Consult Needed: No Regulatory affairs officer (For Healthcare) Does patient have an advance directive?: No Would patient like information on creating an advanced directive?: No - patient declined information    Additional Information 1:1 In Past 12 Months?: No CIRT Risk: No Elopement Risk: No     Disposition:  Disposition Initial Assessment Completed for this Encounter: Yes Disposition of Patient: Inpatient treatment program Type of inpatient treatment program: Adult  Samuel Reyes 10/28/2015 4:23 PM

## 2015-10-28 NOTE — ED Notes (Signed)
Pt. Has been wanded and belongings at the [employer]

## 2015-10-28 NOTE — Progress Notes (Addendum)
Patient was reccommended inpatient treatment per NP Ricky Ala, on 3/31.   Patient has been referred to the following facilities: [facility] - per [facility] Janus, accepting referrals tonight. [facility] per Izora Gala, will review. [facility] - per [facility], will look at it. [facility] - per [employer], couple of beds open. [facility] - [facility], will review it. Left voicemail for [facility] inquiring about bed status.  [facility] are both not accepting adult medicaid.  Declined: [facility] - per [employer], due withdrawal symptoms and SA  At capacity:  [facility] - per [facility] [place], one geriatric male bed tonight. [facility] - per [employer] - per [facility] - per [facility] - per [facility] Dar - per [facility]  CSW will continue to follow up with placement.  Verlon Setting, [facility] Disposition staff 10/28/2015 11:26 PM

## 2015-10-28 NOTE — ED Notes (Signed)
Ordered reg diet, no sharps dinner tray

## 2015-10-29 ENCOUNTER — Inpatient Hospital Stay (HOSPITAL_COMMUNITY)
Admission: AD | Admit: 2015-10-29 | Discharge: 2015-11-07 | DRG: 885 | Disposition: A | Discharge status: Home / Self Care | Payer: Medicaid Other | Source: Intra-hospital | Attending: Psychiatry | Admitting: Psychiatry

## 2015-10-29 ENCOUNTER — Encounter (HOSPITAL_COMMUNITY): Payer: Self-pay

## 2015-10-29 DIAGNOSIS — J449 Chronic obstructive pulmonary disease, unspecified: Secondary | ICD-10-CM | POA: Diagnosis present

## 2015-10-29 DIAGNOSIS — F1020 Alcohol dependence, uncomplicated: Secondary | ICD-10-CM | POA: Diagnosis present

## 2015-10-29 DIAGNOSIS — F17210 Nicotine dependence, cigarettes, uncomplicated: Secondary | ICD-10-CM | POA: Diagnosis present

## 2015-10-29 DIAGNOSIS — G4700 Insomnia, unspecified: Secondary | ICD-10-CM | POA: Diagnosis present

## 2015-10-29 DIAGNOSIS — F331 Major depressive disorder, recurrent, moderate: Principal | ICD-10-CM | POA: Diagnosis present

## 2015-10-29 DIAGNOSIS — R45851 Suicidal ideations: Secondary | ICD-10-CM | POA: Diagnosis present

## 2015-10-29 DIAGNOSIS — F1010 Alcohol abuse, uncomplicated: Secondary | ICD-10-CM | POA: Diagnosis not present

## 2015-10-29 DIAGNOSIS — Y907 Blood alcohol level of 200-239 mg/100 ml: Secondary | ICD-10-CM | POA: Diagnosis present

## 2015-10-29 DIAGNOSIS — J069 Acute upper respiratory infection, unspecified: Secondary | ICD-10-CM | POA: Diagnosis present

## 2015-10-29 LAB — BASIC METABOLIC PANEL
ANION GAP: 8 (ref 5–15)
BUN: <5 mg/dL — ABNORMAL LOW (ref 6–20)
CHLORIDE: 97 mmol/L — AB (ref 101–111)
CO2: 26 mmol/L (ref 22–32)
Calcium: 7.5 mg/dL — ABNORMAL LOW (ref 8.9–10.3)
Creatinine, Ser: 0.74 mg/dL (ref 0.61–1.24)
GFR calc Af Amer: >60 mL/min (ref >60)
GLUCOSE: 107 mg/dL — AB (ref 65–99)
POTASSIUM: 3.2 mmol/L — AB (ref 3.5–5.1)
Sodium: 131 mmol/L — ABNORMAL LOW (ref 135–145)

## 2015-10-29 MED ORDER — MAGNESIUM HYDROXIDE 400 MG/5ML PO SUSP: 30.0000 mL | Freq: Every day | ORAL | Status: DC | PRN

## 2015-10-29 MED ORDER — ALUM & MAG HYDROXIDE-SIMETH 200-200-20 MG/5ML PO SUSP: 30.0000 mL | ORAL | Status: DC | PRN

## 2015-10-29 MED ORDER — ACETAMINOPHEN 325 MG PO TABS
650.0000 mg | ORAL_TABLET | Freq: Four times a day (QID) | ORAL | Status: DC | PRN
  Administered 2015-10-29 – 2015-11-07 (×10): 650 mg via ORAL
  Filled 2015-10-29 (×10): qty 2

## 2015-10-29 MED ORDER — METOPROLOL TARTRATE 25 MG PO TABS
25.0000 mg | ORAL_TABLET | Freq: Every day | ORAL | Status: DC
  Administered 2015-10-30 – 2015-11-07 (×9): 25 mg via ORAL
  Filled 2015-10-29 (×13): qty 1

## 2015-10-29 MED ORDER — CHLORDIAZEPOXIDE HCL 25 MG PO CAPS
25.0000 mg | ORAL_CAPSULE | Freq: Four times a day (QID) | ORAL | Status: AC
  Administered 2015-10-29 – 2015-10-30 (×4): 25 mg via ORAL
  Filled 2015-10-29 (×4): qty 1

## 2015-10-29 MED ORDER — PNEUMOCOCCAL VAC POLYVALENT 25 MCG/0.5ML IJ INJ
0.5000 mL | INJECTION | INTRAMUSCULAR | Status: AC
  Administered 2015-10-30: 0.5 mL via INTRAMUSCULAR

## 2015-10-29 MED ORDER — VITAMIN B-1 100 MG PO TABS
100.0000 mg | ORAL_TABLET | Freq: Every day | ORAL | Status: DC
  Administered 2015-10-30 – 2015-11-07 (×9): 100 mg via ORAL
  Filled 2015-10-29 (×11): qty 1

## 2015-10-29 MED ORDER — LOPERAMIDE HCL 2 MG PO CAPS: 2.0000 mg | ORAL_CAPSULE | ORAL | Status: AC | PRN

## 2015-10-29 MED ORDER — GABAPENTIN 100 MG PO CAPS
100.0000 mg | ORAL_CAPSULE | Freq: Three times a day (TID) | ORAL | Status: DC
  Administered 2015-10-29 – 2015-11-07 (×27): 100 mg via ORAL
  Filled 2015-10-29 (×35): qty 1

## 2015-10-29 MED ORDER — TRAZODONE HCL 100 MG PO TABS
100.0000 mg | ORAL_TABLET | Freq: Every day | ORAL | Status: DC
  Administered 2015-10-29: 100 mg via ORAL
  Filled 2015-10-29 (×3): qty 1

## 2015-10-29 MED ORDER — ALBUTEROL SULFATE HFA 108 (90 BASE) MCG/ACT IN AERS: 1.0000 | INHALATION_SPRAY | RESPIRATORY_TRACT | Status: DC | PRN

## 2015-10-29 MED ORDER — THIAMINE HCL 100 MG/ML IJ SOLN: 100.0000 mg | Freq: Once | INTRAMUSCULAR | Status: DC

## 2015-10-29 MED ORDER — CHLORDIAZEPOXIDE HCL 25 MG PO CAPS: 25.0000 mg | ORAL_CAPSULE | ORAL | Status: DC

## 2015-10-29 MED ORDER — NICOTINE 21 MG/24HR TD PT24
21.0000 mg | MEDICATED_PATCH | Freq: Every day | TRANSDERMAL | Status: DC
  Administered 2015-10-29 – 2015-11-07 (×10): 21 mg via TRANSDERMAL
  Filled 2015-10-29 (×12): qty 1

## 2015-10-29 MED ORDER — ONDANSETRON 4 MG PO TBDP: 4.0000 mg | ORAL_TABLET | Freq: Four times a day (QID) | ORAL | Status: AC | PRN

## 2015-10-29 MED ORDER — PANTOPRAZOLE SODIUM 40 MG PO TBEC
40.0000 mg | DELAYED_RELEASE_TABLET | Freq: Every day | ORAL | Status: DC
  Administered 2015-10-30 – 2015-11-07 (×9): 40 mg via ORAL
  Filled 2015-10-29 (×11): qty 1

## 2015-10-29 MED ORDER — CHLORDIAZEPOXIDE HCL 25 MG PO CAPS: 25.0000 mg | ORAL_CAPSULE | Freq: Four times a day (QID) | ORAL | Status: DC | PRN

## 2015-10-29 MED ORDER — ADULT MULTIVITAMIN W/MINERALS CH
1.0000 | ORAL_TABLET | Freq: Every day | ORAL | Status: DC
  Administered 2015-10-29 – 2015-11-07 (×10): 1 via ORAL
  Filled 2015-10-29 (×12): qty 1

## 2015-10-29 MED ORDER — IPRATROPIUM-ALBUTEROL 20-100 MCG/ACT IN AERS
1.0000 | INHALATION_SPRAY | Freq: Four times a day (QID) | RESPIRATORY_TRACT | Status: DC | PRN
Start: 2015-10-29 — End: 2015-11-07
  Administered 2015-10-30 – 2015-11-07 (×4): 1 via RESPIRATORY_TRACT
  Filled 2015-10-29: qty 4

## 2015-10-29 MED ORDER — CHLORDIAZEPOXIDE HCL 25 MG PO CAPS: 25.0000 mg | ORAL_CAPSULE | Freq: Every day | ORAL | Status: DC

## 2015-10-29 MED ORDER — CLONIDINE HCL 0.2 MG PO TABS
0.2000 mg | ORAL_TABLET | Freq: Three times a day (TID) | ORAL | Status: DC
  Administered 2015-10-30 – 2015-10-31 (×2): 0.2 mg via ORAL
  Filled 2015-10-29 (×8): qty 1
  Filled 2015-10-29: qty 2
  Filled 2015-10-29 (×3): qty 1

## 2015-10-29 MED ORDER — HYDROXYZINE HCL 25 MG PO TABS
25.0000 mg | ORAL_TABLET | Freq: Four times a day (QID) | ORAL | Status: AC | PRN
  Administered 2015-10-29 – 2015-10-31 (×2): 25 mg via ORAL
  Filled 2015-10-29 (×2): qty 1

## 2015-10-29 MED ORDER — CHLORDIAZEPOXIDE HCL 25 MG PO CAPS
25.0000 mg | ORAL_CAPSULE | Freq: Three times a day (TID) | ORAL | Status: DC
  Administered 2015-10-30 – 2015-10-31 (×2): 25 mg via ORAL
  Filled 2015-10-29 (×2): qty 1

## 2015-10-29 MED ORDER — POTASSIUM CHLORIDE CRYS ER 20 MEQ PO TBCR
40.0000 meq | EXTENDED_RELEASE_TABLET | Freq: Once | ORAL | Status: AC
  Administered 2015-10-29: 40 meq via ORAL
  Filled 2015-10-29: qty 2

## 2015-10-29 MED ORDER — TRAZODONE HCL 50 MG PO TABS
50.0000 mg | ORAL_TABLET | Freq: Every evening | ORAL | Status: DC | PRN
  Administered 2015-10-30 – 2015-11-01 (×3): 50 mg via ORAL
  Filled 2015-10-29 (×3): qty 1

## 2015-10-29 MED ORDER — BUDESONIDE 0.25 MG/2ML IN SUSP
0.2500 mg | Freq: Two times a day (BID) | RESPIRATORY_TRACT | Status: DC
  Administered 2015-10-30 – 2015-11-06 (×15): 0.25 mg via RESPIRATORY_TRACT
  Filled 2015-10-29 (×22): qty 2

## 2015-10-29 NOTE — Progress Notes (Signed)
Patient did attend the evening speaker AA meeting.  

## 2015-10-29 NOTE — ED Notes (Signed)
O2 at 2 LPM via nasal cannula applied to patient after noting oxygen sats 88% repeatedly. Oxygen sats 95% on 2 LPM via nasal cannula. Patient resting in bed with eyes closed. Sitter present.

## 2015-10-29 NOTE — ED Notes (Signed)
Pt's friend - Demetrius Revel [phone].

## 2015-10-29 NOTE — ED Notes (Signed)
Pt's friends x 2 visiting w/pt.

## 2015-10-29 NOTE — Tx Team (Signed)
Initial Interdisciplinary Treatment Plan   PATIENT STRESSORS: Health problems Loss of Wife and children   PATIENT STRENGTHS: Warehouse manager means   PROBLEM LIST: Problem List/Patient Goals Date to be addressed Date deferred Reason deferred Estimated date of resolution  Depression 10/29/15     Suicidal ideation 10/29/15     Substance abuse 10/29/15     "Quit drinking" 10/29/15     "I want to feel better about myself" 10/29/15                              DISCHARGE CRITERIA:  Medical problems require only outpatient monitoring Need for constant or close observation no longer present Verbal commitment to aftercare and medication compliance  PRELIMINARY DISCHARGE PLAN: Attend aftercare/continuing care group Participate in family therapy Medication management  PATIENT/FAMIILY INVOLVEMENT: This treatment plan has been presented to and reviewed with the patient, Samuel Reyes.  The patient and family have been given the opportunity to ask questions and make suggestions.  Samuel Reyes 10/29/2015, 12:51 PM

## 2015-10-29 NOTE — ED Provider Notes (Signed)
Pt awaiting placement for SI and also substance abuse Mildly tachycardic, but otherwise stable, answers questions appropriately No significant tremor noted   Ripley Fraise, MD 10/29/15 0109

## 2015-10-29 NOTE — Progress Notes (Addendum)
Samuel Reyes is a 60 year old male being voluntarily admitted [facility] for depression, alcohol abuse and suicidal ideation with no plan.  He has history of multiple attempts in the past including slitting wrists and OD on his medications.  He denies HI or A/V hallucinations.  He reports multiple losses in his family and wants to die and not be in pain anymore.  His diagnosis is Major Depressive Disorder and Alcohol use disorder.  He reports drinking 5-6 40oz beers per day.  He admits to multiple back/shoulder/abdominal surgeries.  He states that his pain level is 9/10 for generalized pain.  He is a high fall risk and gait is unsteady.  Yellow socks, yellow wrist band and reviewed with patient the importance of asking for assistance when needed.  Walker given to him and he was able to slowly walk to his room.  Admission paperwork completed and signed.  Belongings searched and secured in locker # 41(black belt, black hat, NCID, Blue hoodie, black shoes with strings, black cell phone, black wallet w/ EBT card, SS Card, Medicaid card and $0.08 pennies).  Skin assessment completed and noted neck surgery scar, abdominal surgery scar, tattoos on right and left shoulder, lower right arm and lower left arm and fingers.  Q 15 minute checks initiated for safety.  We will monitor the progress towards his goals.

## 2015-10-29 NOTE — ED Notes (Signed)
Order received from Dr Alvino Chapel - Potassium 38meq po now.

## 2015-10-29 NOTE — ED Provider Notes (Signed)
  Physical Exam  BP 96/65 mmHg  Pulse 96  Temp(Src) 97.5 F (36.4 C) (Oral)  Resp 20  Ht 5\' 4"  (1.626 m)  Wt 159 lb (72.122 kg)  BMI 27.28 kg/m2  SpO2 98%  Physical Exam  ED Course  Procedures  MDM Accepted at behavioral health by Dr. Sabra Heck.      Davonna Belling, MD 10/29/15 [phone]

## 2015-10-29 NOTE — ED Notes (Addendum)
Per Micheal Likens, [facility] - Accepted to [facility] - [id number] - Dr Sabra Heck - pending repeat of BMET. May be transported after 1100. Pt aware and is in agreement.

## 2015-10-30 ENCOUNTER — Encounter (HOSPITAL_COMMUNITY): Payer: Self-pay | Admitting: Psychiatry

## 2015-10-30 DIAGNOSIS — F1020 Alcohol dependence, uncomplicated: Secondary | ICD-10-CM

## 2015-10-30 DIAGNOSIS — F331 Major depressive disorder, recurrent, moderate: Principal | ICD-10-CM

## 2015-10-30 MED ORDER — LIDOCAINE 5 % EX PTCH
1.0000 | MEDICATED_PATCH | Freq: Every day | CUTANEOUS | Status: DC
  Administered 2015-10-30 – 2015-11-07 (×9): 1 via TRANSDERMAL
  Filled 2015-10-30 (×12): qty 1

## 2015-10-30 MED ORDER — ENSURE ENLIVE PO LIQD
237.0000 mL | Freq: Two times a day (BID) | ORAL | Status: DC
  Administered 2015-10-30 – 2015-11-05 (×7): 237 mL via ORAL

## 2015-10-30 MED ORDER — GUAIFENESIN ER 600 MG PO TB12
600.0000 mg | ORAL_TABLET | Freq: Two times a day (BID) | ORAL | Status: DC
  Administered 2015-10-30 – 2015-10-31 (×2): 600 mg via ORAL
  Filled 2015-10-30 (×8): qty 1

## 2015-10-30 MED ORDER — MIRTAZAPINE 15 MG PO TABS
15.0000 mg | ORAL_TABLET | Freq: Every day | ORAL | Status: DC
  Administered 2015-10-30 – 2015-11-06 (×7): 15 mg via ORAL
  Filled 2015-10-30 (×13): qty 1

## 2015-10-30 MED ORDER — POTASSIUM CHLORIDE CRYS ER 20 MEQ PO TBCR
20.0000 meq | EXTENDED_RELEASE_TABLET | Freq: Two times a day (BID) | ORAL | Status: AC
  Administered 2015-10-30 – 2015-10-31 (×2): 20 meq via ORAL
  Filled 2015-10-30 (×2): qty 1

## 2015-10-30 NOTE — [facility] Suicide Risk Assessment (Signed)
[facility] Admission Suicide Risk Assessment   Nursing information obtained from:  Patient Demographic factors:  Male, Divorced or widowed, Low socioeconomic status, Living alone, Unemployed Current Mental Status:  Suicidal ideation indicated by patient, Self-harm thoughts Loss Factors:  Loss of significant relationship, Decline in physical health, Financial problems / change in socioeconomic status Historical Factors:  Prior suicide attempts, Impulsivity Risk Reduction Factors:  NA  Total Time spent with patient: 30 minutes Principal Problem: Major depressive disorder, recurrent episode, moderate with anxious distress ([place]) Diagnosis:   Patient Active Problem List   Diagnosis Date Noted  . Major depressive disorder, recurrent episode, moderate with anxious distress ([place]) [F33.1] 10/30/2015  . Alcohol use disorder, severe, dependence ([place]) [F10.20] 10/29/2015  . COPD exacerbation ([place]) [J44.1] 09/20/2015  . Acute respiratory failure with hypoxia ([place]) [J96.01] 09/18/2015  . Alcoholism /alcohol abuse ([place]) [F10.20]   . Suicidal ideation [R45.851] 09/17/2015  . Alcohol intoxication ([place]) [F10.129] 09/17/2015  . Depression [F32.9] 09/17/2015  . Benign essential HTN [I10] 09/17/2015  . Hypokalemia [E87.6] 09/17/2015  . Hyponatremia [E87.1] 09/17/2015  . Coffee ground emesis [K92.0] 09/17/2015  . COPD (chronic obstructive pulmonary disease) ([place]) [J44.9] 02/17/2013  . GERD (gastroesophageal reflux disease) [K21.9] 02/17/2013   Subjective Data: Please see H&P.   Continued Clinical Symptoms:  Alcohol Use Disorder Identification Test Final Score (AUDIT): 26 The "Alcohol Use Disorders Identification Test", Guidelines for Use in Primary Care, Second Edition.  World Pharmacologist [facility]). Score between 0-7:  no or low risk or alcohol related problems. Score between 8-15:  moderate risk of alcohol related problems. Score between 16-19:  high risk of alcohol related problems. Score 20 or  above:  warrants further diagnostic evaluation for alcohol dependence and treatment.   CLINICAL FACTORS:   Alcohol/Substance Abuse/Dependencies Previous Psychiatric Diagnoses and Treatments Medical Diagnoses and Treatments/Surgeries   Musculoskeletal: Strength & Muscle Tone: within normal limits Gait & Station: normal Patient leans: N/A  Psychiatric Specialty Exam: Review of Systems  Constitutional: Positive for malaise/fatigue.  Gastrointestinal: Positive for heartburn.  Musculoskeletal: Positive for myalgias and joint pain.  Neurological: Positive for tremors.  Psychiatric/Behavioral: Positive for depression and substance abuse. The patient is nervous/anxious and has insomnia.   All other systems reviewed and are negative.   Blood pressure 111/80, pulse 123, temperature 97.9 F (36.6 C), temperature source Oral, resp. rate 24, height 5\' 5"  (1.651 m), weight 70.308 kg (155 lb), SpO2 96 %.Body mass index is 25.79 kg/(m^2).                            Please see H&P.                             COGNITIVE FEATURES THAT CONTRIBUTE TO RISK:  Closed-mindedness, Polarized thinking and Thought constriction (tunnel vision)    SUICIDE RISK:   Moderate:  Frequent suicidal ideation with limited intensity, and duration, some specificity in terms of plans, no associated intent, good self-control, limited dysphoria/symptomatology, some risk factors present, and identifiable protective factors, including available and accessible social support.  PLAN OF CARE: Please see H&P.   I certify that inpatient services furnished can reasonably be expected to improve the patient's condition.   Tanette Chauca, MD 10/30/2015, 10:21 AM

## 2015-10-30 NOTE — Progress Notes (Signed)
D   Pt is pleasant and cooperative on approach    He is compliant with treatment and interacts well with others    He uses a walker to ambulate  A    Verbal support given   Medications administered and effectiveness monitored    Q 15 min checks R   Pt safe at present

## 2015-10-30 NOTE — Progress Notes (Signed)
Patient ID: Bluford Kubes, male   DOB: [birth year], 60 y.o.   MRN: [id number] Adult Psychoeducational Group Note  Date:  10/30/2015 Time:  1:45pm   Group Topic/Focus:  Identifying Needs:   The focus of this group is to help patients identify their personal needs that have been historically problematic and identify healthy behaviors to address their needs.  Participation Level:  Did Not Attend  Participation Quality: n/a  Affect: n/a  Cognitive: n/a  Insight:n/a  Engagement in Group:  n/a  Modes of Intervention:  Activity, Discussion, Education and Support  Additional Comments:  Pt did not attend group, pt in bed asleep.   Elenore Rota 10/30/2015, 2:21 PM

## 2015-10-30 NOTE — Progress Notes (Signed)
Patient did attend the evening speaker AA meeting.  

## 2015-10-30 NOTE — Progress Notes (Signed)
D- Patient is in a depressed mood with a sad affect.  Patient was observed in the milieu interacting well with peers.  Denies SI, HI, and AVH.  Patient had c/o a headache.  Patient was given Tylenol for pain per MD order. See MAR.  Patient was sleep upon reassessment.  Patient also had complaints of congestion.  No other complaints.       A- Scheduled medications administered to patient, per MD orders. Support and encouragement provided.  Routine safety checks conducted every 15 minutes.  Patient informed to notify staff with problems or concerns. R- Patient contracts for safety at this time. Patient compliant with medications and treatment plan. Patient receptive, calm, and cooperative. Patient remains safe at this time.
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
## 2015-10-30 NOTE — Progress Notes (Signed)
Patient ID: Samuel Reyes, male   DOB: 1955-11-25, 61 y.o.   MRN: [id number]  Pt currently presents with a sad affect and cooeprative behavior. Per self inventory, pt rates depression at a 8, hopelessness 7 and anxiety 7. Pt's daily goal is to "being good to myself" and they intend to do so by " be more open." Pt reports fair sleep, a fair appetite, low energy and good concentration. Pt reports signs and symptoms of withdrawal including tremors, chilling and irritability. Pt reports  Increased difficulty breathing for the past couple of months that has worsened with a "cold I've had for four days."   Pt provided with medications per providers orders. Pt's labs and vitals were monitored throughout the day. Pt supported emotionally and encouraged to express concerns and questions. Pt educated on medications. Pt given nebulizer treatment, respiratory assessment completed. NP notified, see MAR. Pt high fall risk, educated on risks.   Pt's safety ensured with 15 minute and environmental checks. Pt currently denies SI/HI and A/V hallucinations. Pt verbally agrees to seek staff if SI/HI or A/VH occurs and to consult with staff before acting on these thoughts. PT remains safe on the unit. Will continue POC.
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
## 2015-10-30 NOTE — BHH Group Notes (Signed)
[facility] Group Notes: (Clinical Social Work)   10/30/2015      Type of Therapy:  Group Therapy   Participation Level:  Did Not Attend despite MHT prompting   Selmer Dominion, LCSW 10/30/2015, 12:24 PM

## 2015-10-31 LAB — BASIC METABOLIC PANEL
ANION GAP: 8 (ref 5–15)
BUN: 13 mg/dL (ref 6–20)
CALCIUM: 8.4 mg/dL — AB (ref 8.9–10.3)
CO2: 26 mmol/L (ref 22–32)
Chloride: 104 mmol/L (ref 101–111)
Creatinine, Ser: 0.73 mg/dL (ref 0.61–1.24)
Glucose, Bld: 69 mg/dL (ref 65–99)
Potassium: 3.9 mmol/L (ref 3.5–5.1)
Sodium: 138 mmol/L (ref 135–145)

## 2015-10-31 LAB — TSH: TSH: 1.266 u[IU]/mL (ref 0.350–4.500)

## 2015-10-31 MED ORDER — LORAZEPAM 1 MG PO TABS
1.0000 mg | ORAL_TABLET | Freq: Four times a day (QID) | ORAL | Status: AC
  Administered 2015-10-31 – 2015-11-01 (×6): 1 mg via ORAL
  Filled 2015-10-31 (×6): qty 1

## 2015-10-31 MED ORDER — LORAZEPAM 1 MG PO TABS
1.0000 mg | ORAL_TABLET | Freq: Three times a day (TID) | ORAL | Status: AC
Start: 2015-11-02 — End: 2015-11-02
  Administered 2015-11-02 (×3): 1 mg via ORAL
  Filled 2015-10-31 (×3): qty 1

## 2015-10-31 MED ORDER — LORAZEPAM 1 MG PO TABS: 1.0000 mg | ORAL_TABLET | Freq: Four times a day (QID) | ORAL | Status: AC | PRN

## 2015-10-31 MED ORDER — IBUPROFEN 600 MG PO TABS
600.0000 mg | ORAL_TABLET | Freq: Four times a day (QID) | ORAL | Status: DC | PRN
  Administered 2015-10-31 – 2015-11-06 (×7): 600 mg via ORAL
  Filled 2015-10-31 (×7): qty 1

## 2015-10-31 MED ORDER — LOPERAMIDE HCL 2 MG PO CAPS: 2.0000 mg | ORAL_CAPSULE | ORAL | Status: DC | PRN

## 2015-10-31 MED ORDER — VITAMIN B-1 100 MG PO TABS: 100.0000 mg | ORAL_TABLET | Freq: Every day | ORAL | Status: DC

## 2015-10-31 MED ORDER — LORAZEPAM 1 MG PO TABS
1.0000 mg | ORAL_TABLET | Freq: Every day | ORAL | Status: AC
Start: 2015-11-04 — End: 2015-11-04
  Administered 2015-11-04: 1 mg via ORAL
  Filled 2015-10-31: qty 1

## 2015-10-31 MED ORDER — ONDANSETRON 4 MG PO TBDP: 4.0000 mg | ORAL_TABLET | Freq: Four times a day (QID) | ORAL | Status: DC | PRN

## 2015-10-31 MED ORDER — ADULT MULTIVITAMIN W/MINERALS CH: 1.0000 | ORAL_TABLET | Freq: Every day | ORAL | Status: DC

## 2015-10-31 MED ORDER — BENZONATATE 100 MG PO CAPS
100.0000 mg | ORAL_CAPSULE | Freq: Three times a day (TID) | ORAL | Status: DC
Start: 2015-10-31 — End: 2015-11-02
  Administered 2015-10-31 – 2015-11-02 (×8): 100 mg via ORAL
  Filled 2015-10-31 (×15): qty 1

## 2015-10-31 MED ORDER — LORAZEPAM 1 MG PO TABS
1.0000 mg | ORAL_TABLET | Freq: Two times a day (BID) | ORAL | Status: AC
  Administered 2015-11-03 (×2): 1 mg via ORAL
  Filled 2015-10-31 (×2): qty 1

## 2015-10-31 MED ORDER — THIAMINE HCL 100 MG/ML IJ SOLN
100.0000 mg | Freq: Once | INTRAMUSCULAR | Status: AC
  Administered 2015-10-31: 100 mg via INTRAMUSCULAR
  Filled 2015-10-31: qty 2

## 2015-10-31 MED ORDER — BENZONATATE 100 MG PO CAPS
ORAL_CAPSULE | ORAL | Status: AC
  Administered 2015-10-31: 100 mg via ORAL
  Filled 2015-10-31: qty 1

## 2015-10-31 MED ORDER — HYDROXYZINE HCL 25 MG PO TABS: 25.0000 mg | ORAL_TABLET | Freq: Four times a day (QID) | ORAL | Status: DC | PRN

## 2015-10-31 NOTE — BHH Group Notes (Signed)
[facility] LCSW Aftercare Discharge Planning Group Note   10/31/2015 10:36 AM  Participation Quality:  Appropriate   Mood/Affect:  Depressed  Depression Rating:  8  Anxiety Rating:  8  Thoughts of Suicide:  No Will you contract for safety?   NA  Current AVH:  No  Plan for Discharge/Comments:  Pt reports that he slept "off and on" last night; reports achiness and chest cold that he is seeking medication for. Pt reports feeling dizzy and is using walker/high fall risk. Pt is not sure if he wants to pursue inpatient or outpatient treatment from here. CSW assessing for appropriate referrals.   Transportation Means: unknown at this time.    Supports: none identified by pt.   Smart, Freida Nebel LCSW

## 2015-10-31 NOTE — Progress Notes (Signed)
Patient ID: Samuel Reyes, male   DOB: [birth year], 60 y.o.   MRN: [id number]  Pt currently presents with a flat affect and cooperative behavior. Pt reports increased pain in chest from night time coughing. Pt also reports all over generalized pain. Pt attends groups, interacts superficially with other pts.   Pt provided with prn and scheduled medications per providers orders. Pt's labs and vitals were monitored throughout the day. Pt supported emotionally and encouraged to express concerns and questions. Respiratory assessment completed. Pt educated on medications. NP notified of pt increase in symptoms.   Pt's safety ensured with 15 minute and environmental checks. Pt currently denies SI/HI and A/V hallucinations. Pt verbally agrees to seek staff if SI/HI or A/VH occurs and to consult with staff before acting on these thoughts. Will continue POC.

## 2015-10-31 NOTE — Tx Team (Signed)
Interdisciplinary Treatment Plan Update (Adult)  Date:  10/31/2015  Time Reviewed:  8:50 AM   Progress in Treatment: Attending groups: Yes. Participating in groups:  Yes. Taking medication as prescribed:  Yes. Tolerating medication:  Yes. Family/Significant othe contact made:  SPE required for pt.  Patient understands diagnosis:  Yes. and As evidenced by:  seeking treatment for alcohol abuse, depression, SI, and for medication management Discussing patient identified problems/goals with staff:  Yes. Medical problems stabilized or resolved:  Yes. Denies suicidal/homicidal ideation: Yes. Issues/concerns per patient self-inventory:  Other:  Discharge Plan or Barriers: CSW assessing for appropriate referrals. Pt is unsure if he wants to pursue inpatient or outpatient treatment at this time. Pt reports that he is living on second story of boarding house and needs to move due to his physical limitations "I have a hard time going up stairs."   Reason for Continuation of Hospitalization: Depression Medication stabilization Suicidal ideation Withdrawal symptoms  Comments:  Samuel Reyes is an 60 y.o. male who came to the Emergency Department due to severe alcohol abuse and depression with some suicidal ideations. He states that he "doesn't want to live anymore". When asked if he wanted to kill himself he states "I tried that, it didn't work." He states that he tried to slit his wrists 4 years ago and overdosed on medication 2 years ago. He was not admitted inpatient either of these times. He was admitted to [facility] in 2016 for alcohol dependence. He states that he started drinking when he was 61 after his first son died and now drinks 5-9 40oz beers daily. He states his last drink was at 9am this morning. He reports that he has bad withdrawal symptoms and is starting to "get the shakes". He states that he also lost his other son 6 years ago and his wife 3 years ago. He says that he has been having  a lot of depression since then and just "wants to die" to not be in pain anymore. He denies HI or A/V hallucinations. Diagnosis: Major Depressive Disorder Recurrent Severe, Alcohol use disorder severe    Estimated length of stay:  3-5 days   New goal(s): to develop effective aftercare plan.   Additional Comments:  Patient and CSW reviewed pt's identified goals and treatment plan. Patient verbalized understanding and agreed to treatment plan. CSW reviewed [facility] "Discharge Process and Patient Involvement" Form. Pt verbalized understanding of information provided and signed form.    Review of initial/current patient goals per problem list:  1. Goal(s): Patient will participate in aftercare plan  Met: No.   Target date: at discharge  As evidenced by: Patient will participate within aftercare plan AEB aftercare provider and housing plan at discharge being identified.  4/3: CSW assessing for appropriate referrals.   2. Goal (s): Patient will exhibit decreased depressive symptoms and suicidal ideations.  Met: No.    Target date: at discharge  As evidenced by: Patient will utilize self rating of depression at 3 or below and demonstrate decreased signs of depression or be deemed stable for discharge by MD.  4/3: Pt rates depression as 8/10 and presents with depressed mood/flat affect. He denies SI/HI/AVH today.   3. Goal(s): Patient will demonstrate decreased signs of withdrawal due to substance abuse  Met:No.   Target date:at discharge   As evidenced by: Patient will produce a CIWA/COWS score of 0, have stable vitals signs, and no symptoms of withdrawal.  4/3: Pt reports mild withdrawals today with CIWA score of 4  and stable vitals. Goal progressing.   Attendees: Patient:   10/31/2015 8:50 AM   Family:   10/31/2015 8:50 AM   Physician:  Dr. Carlton Adam, MD 10/31/2015 8:50 AM   Nursing:   Leisa Lenz RN 10/31/2015 8:50 AM   Clinical Social Worker: Maxie Better, LCSW 10/31/2015  8:50 AM   Clinical Social Worker: Erasmo Downer Drinkard LCSW 10/31/2015 8:50 AM   Other:  Gerline Legacy Nurse Case Manager 10/31/2015 8:50 AM   Other:  Agustina Caroli NP  10/31/2015 8:50 AM   Other:   10/31/2015 8:50 AM   Other:  10/31/2015 8:50 AM   Other:  10/31/2015 8:50 AM   Other:  10/31/2015 8:50 AM    10/31/2015 8:50 AM    10/31/2015 8:50 AM    10/31/2015 8:50 AM    10/31/2015 8:50 AM    Scribe for Treatment Team:   Maxie Better, LCSW 10/31/2015 8:50 AM

## 2015-10-31 NOTE — Progress Notes (Signed)
[facility] MD Progress Note  10/31/2015 4:44 PM Shon Kelty  MRN:  [id number] Subjective:  Cortney states that he relapsed heavily on alcohol. He had been drinking up to 3 days ago. He endorses he is starting to detox has tremors. Feels weak, using a walker to help himself around Principal Problem: Major depressive disorder, recurrent episode, moderate with anxious distress ([place]) Diagnosis:   Patient Active Problem List   Diagnosis Date Noted  . Major depressive disorder, recurrent episode, moderate with anxious distress ([place]) [F33.1] 10/30/2015  . Alcohol use disorder, severe, dependence ([place]) [F10.20] 10/29/2015  . COPD exacerbation ([place]) [J44.1] 09/20/2015  . Acute respiratory failure with hypoxia ([place]) [J96.01] 09/18/2015  . Alcoholism /alcohol abuse ([place]) [F10.20]   . Suicidal ideation [R45.851] 09/17/2015  . Alcohol intoxication ([place]) [F10.129] 09/17/2015  . Depression [F32.9] 09/17/2015  . Benign essential HTN [I10] 09/17/2015  . Hypokalemia [E87.6] 09/17/2015  . Hyponatremia [E87.1] 09/17/2015  . Coffee ground emesis [K92.0] 09/17/2015  . COPD (chronic obstructive pulmonary disease) ([place]) [J44.9] 02/17/2013  . GERD (gastroesophageal reflux disease) [K21.9] 02/17/2013   Total Time spent with patient: 20 minutes  Past Psychiatric History: see admission H and P  Past Medical History:  Past Medical History  Diagnosis Date  . COPD (chronic obstructive pulmonary disease) [facility])     Past Surgical History  Procedure Laterality Date  . Gastrectomy    . Shoulder surgery Bilateral     3 surgeries on on left, 2 surgeries on right   . Rt knee arthroscopic surgery    . Back surgery      3 cervical spine surgeries C4-C5 fused  . Hernia repair    . Finger surgery Left     2nd, 3rd, & 4th fingers were cut off by table saw and reattached  . Colonoscopy N/A 01/04/2014    Procedure: COLONOSCOPY;  Surgeon: Danie Binder, MD;  Location: [facility] ENDO SUITE;  Service: Endoscopy;  Laterality: N/A;   1:45  . Esophagogastroduodenoscopy N/A 01/04/2014    Procedure: ESOPHAGOGASTRODUODENOSCOPY (EGD);  Surgeon: Danie Binder, MD;  Location: [facility] ENDO SUITE;  Service: Endoscopy;  Laterality: N/A;  . Incisional hernia repair N/A 01/20/2014    Procedure: LAPAROSCOPIC RECURRENT  INCISIONAL HERNIA with mesh;  Surgeon: Edward Jolly, MD;  Location: [facility] ORS;  Service: General;  Laterality: N/A;   Family History:  Family History  Problem Relation Age of Onset  . Cancer Father     bone  . Cancer Brother     lungs  . Stroke Maternal Grandmother   . Colon cancer Neg Hx   . Asthma Son     died at age 66 in his sleep   . Spina bifida Son     died at age 7   . Dementia Mother    Family Psychiatric  History: see admission H and P Social History:  History  Alcohol Use  . 39.0 oz/week  . 65 Cans of beer per week    Comment: 4-40oz today 1/2gallon vodka a month. 5 to 6 cases a beer a month.     History  Drug Use No    Comment: denid using any drugs    Social History   Social History  . Marital Status: Widowed    Spouse Name: N/A  . Number of Children: N/A  . Years of Education: N/A   Occupational History  . Disability    Social History Main Topics  . Smoking status: Current Every Day Smoker -- 1.00 packs/day  for 45 years    Types: Cigarettes    Start date: 07/30/1966  . Smokeless tobacco: Never Used  . Alcohol Use: 39.0 oz/week    65 Cans of beer per week     Comment: 4-40oz today 1/2gallon vodka a month. 5 to 6 cases a beer a month.  . Drug Use: No     Comment: denid using any drugs  . Sexual Activity: No   Other Topics Concern  . None   Social History Narrative   Additional Social History:                         Sleep: Fair  Appetite:  Fair  Current Medications: Current Facility-Administered Medications  Medication Dose Route Frequency Provider Last Rate Last Dose  . acetaminophen (TYLENOL) tablet 650 mg  650 mg Oral Q6H PRN Encarnacion Slates, NP   650 mg  at 10/30/15 2209  . albuterol (PROVENTIL HFA;VENTOLIN HFA) 108 (90 Base) MCG/ACT inhaler 1-2 puff  1-2 puff Inhalation Q4H PRN Encarnacion Slates, NP      . alum & mag hydroxide-simeth (MAALOX/MYLANTA) 200-200-20 MG/5ML suspension 30 mL  30 mL Oral Q4H PRN Encarnacion Slates, NP      . benzonatate (TESSALON) capsule 100 mg  100 mg Oral TID Kerrie Buffalo, NP   100 mg at 10/31/15 1507  . budesonide (PULMICORT) nebulizer solution 0.25 mg  0.25 mg Nebulization BID Encarnacion Slates, NP   0.25 mg at 10/31/15 0825  . feeding supplement (ENSURE ENLIVE) (ENSURE ENLIVE) liquid 237 mL  237 mL Oral BID BM Saramma Eappen, MD   237 mL at 10/31/15 1546  . gabapentin (NEURONTIN) capsule 100 mg  100 mg Oral TID Encarnacion Slates, NP   100 mg at 10/31/15 1208  . hydrOXYzine (ATARAX/VISTARIL) tablet 25 mg  25 mg Oral Q6H PRN Encarnacion Slates, NP   25 mg at 10/29/15 2210  . hydrOXYzine (ATARAX/VISTARIL) tablet 25 mg  25 mg Oral Q6H PRN Nicholaus Bloom, MD      . ibuprofen (ADVIL,MOTRIN) tablet 600 mg  600 mg Oral Q6H PRN Kerrie Buffalo, NP      . Ipratropium-Albuterol (COMBIVENT) respimat 1 puff  1 puff Inhalation Q6H PRN Encarnacion Slates, NP   1 puff at 10/30/15 1611  . lidocaine (LIDODERM) 5 % 1 patch  1 patch Transdermal Daily Ursula Alert, MD   1 patch at 10/31/15 0825  . loperamide (IMODIUM) capsule 2-4 mg  2-4 mg Oral PRN Encarnacion Slates, NP      . LORazepam (ATIVAN) tablet 1 mg  1 mg Oral Q6H PRN Nicholaus Bloom, MD      . LORazepam (ATIVAN) tablet 1 mg  1 mg Oral QID Nicholaus Bloom, MD       Followed by  . [START ON 11/02/2015] LORazepam (ATIVAN) tablet 1 mg  1 mg Oral TID Nicholaus Bloom, MD       Followed by  . [START ON 11/03/2015] LORazepam (ATIVAN) tablet 1 mg  1 mg Oral BID Nicholaus Bloom, MD       Followed by  . [START ON 11/04/2015] LORazepam (ATIVAN) tablet 1 mg  1 mg Oral Daily Nicholaus Bloom, MD      . magnesium hydroxide (MILK OF MAGNESIA) suspension 30 mL  30 mL Oral Daily PRN Encarnacion Slates, NP      . metoprolol tartrate  (LOPRESSOR) tablet 25 mg  25  mg Oral QAC breakfast Encarnacion Slates, NP   25 mg at 10/31/15 [id number]  . mirtazapine (REMERON) tablet 15 mg  15 mg Oral QHS Ursula Alert, MD   15 mg at 10/30/15 2205  . multivitamin with minerals tablet 1 tablet  1 tablet Oral Daily Encarnacion Slates, NP   1 tablet at 10/31/15 [id number]  . nicotine (NICODERM CQ - dosed in mg/24 hours) patch 21 mg  21 mg Transdermal Daily Nicholaus Bloom, MD   21 mg at 10/31/15 0825  . ondansetron (ZOFRAN-ODT) disintegrating tablet 4 mg  4 mg Oral Q6H PRN Encarnacion Slates, NP      . pantoprazole (PROTONIX) EC tablet 40 mg  40 mg Oral Daily Encarnacion Slates, NP   40 mg at 10/31/15 0826  . thiamine (B-1) injection 100 mg  100 mg Intramuscular Once Nicholaus Bloom, MD      . thiamine (VITAMIN B-1) tablet 100 mg  100 mg Oral Daily Encarnacion Slates, NP   100 mg at 10/31/15 0823  . traZODone (DESYREL) tablet 50 mg  50 mg Oral QHS PRN Encarnacion Slates, NP   50 mg at 10/30/15 2205    Lab Results: No results found for this or any previous visit (from the past 48 hour(s)).  Blood Alcohol level:  Lab Results  Component Value Date   ETH 209* 10/28/2015   ETH 287* 09/17/2015    Physical Findings: AIMS: Facial and Oral Movements Muscles of Facial Expression: None, normal Lips and Perioral Area: None, normal Jaw: None, normal Tongue: None, normal,Extremity Movements Upper (arms, wrists, hands, fingers): None, normal Lower (legs, knees, ankles, toes): None, normal, Trunk Movements Neck, shoulders, hips: None, normal, Overall Severity Severity of abnormal movements (highest score from questions above): None, normal Incapacitation due to abnormal movements: None, normal Patient's awareness of abnormal movements (rate only patient's report): No Awareness, Dental Status Current problems with teeth and/or dentures?: No Does patient usually wear dentures?: No  CIWA:  CIWA-Ar Total: 5 COWS:     Musculoskeletal: Strength & Muscle Tone: within normal limits Gait &  Station: using a walker as unsteady gate Patient leans: normal  Psychiatric Specialty Exam: Review of Systems  Constitutional: Positive for malaise/fatigue.  HENT: Negative.   Eyes: Negative.   Respiratory: Positive for shortness of breath.   Cardiovascular: Negative.   Gastrointestinal: Negative.   Genitourinary: Negative.   Musculoskeletal: Negative.   Skin: Negative.   Neurological: Positive for tremors and weakness.  Endo/Heme/Allergies: Negative.   Psychiatric/Behavioral: Positive for depression and substance abuse. The patient is nervous/anxious.     Blood pressure 90/58, pulse 83, temperature 98 F (36.7 C), temperature source Oral, resp. rate 16, height 5\' 5"  (1.651 m), weight 70.308 kg (155 lb), SpO2 95 %.Body mass index is 25.79 kg/(m^2).  General Appearance: Disheveled  Eye Sport and exercise psychologist::  Fair  Speech:  Clear and Coherent and Slow  Volume:  Decreased  Mood:  Anxious, Depressed and Dysphoric  Affect:  Restricted  Thought Process:  Coherent and Goal Directed  Orientation:  Full (Time, Place, and Person)  Thought Content:  symptoms events worries concerns  Suicidal Thoughts:  No  Homicidal Thoughts:  No  Memory:  Immediate;   Fair Recent;   Fair Remote;   Fair  Judgement:  Fair  Insight:  Shallow  Psychomotor Activity:  Decreased  Concentration:  Fair  Recall:  [place]  Language: Fair  Akathisia:  No  Handed:  Right  AIMS (  if indicated):     Assets:  Desire for Improvement  ADL's:  Intact  Cognition: WNL  Sleep:  Number of Hours: 6.25   Treatment Plan Summary: Daily contact with patient to assess and evaluate symptoms and progress in treatment and Medication management Supportive approach/coping skills Alcohol dependence; Ativan detox protocol/work a relapse prevention plan Insomnia; continue the Remeron 15 mg HS  Work with CBT/mindfulness Explore residential treatment options [place] Haub A, MD 10/31/2015, 4:44 PM

## 2015-10-31 NOTE — Progress Notes (Signed)
Recreation Therapy Notes  Date: 04.03.2017 Time: 9:30am Location: [address] Dayroom   Group Topic: Stress Management  Goal Area(s) Addresses:  Patient will actively participate in stress management techniques presented during session.   Behavioral Response: Did not attend.   Laureen Ochs Tykwon Fera, LRT/CTRS        [place] Osten L 10/31/2015 3:12 PM

## 2015-10-31 NOTE — Progress Notes (Signed)
D   Pt is pleasant and cooperative on approach    He is compliant with treatment and interacts well with others    He uses a walker to ambulate    Pt reports wanting to get into long term treatment  And thinks there is a bed available for him tomorrow   He reports ready to discharge A    Verbal support given   Medications administered and effectiveness monitored    Q 15 min checks R   Pt safe at present

## 2015-10-31 NOTE — BHH Group Notes (Signed)
[place] LCSW Group Therapy  10/31/2015 1:07 PM  Type of Therapy:  Group Therapy  Participation Level:  Active  Participation Quality:  Attentive  Affect:  Appropriate  Cognitive:  Alert and Oriented  Insight:  Improving  Engagement in Therapy:  Engaged  Modes of Intervention:  Confrontation, Discussion, Education, Problem-solving, Rapport Building, Socialization and Support  Summary of Progress/Problems: Today's Topic: Overcoming Obstacles. Patients identified one short term goal and potential obstacles in reaching this goal. Patients processed barriers involved in overcoming these obstacles. Patients identified steps necessary for overcoming these obstacles and explored motivation (internal and external) for facing these difficulties head on. Samuel Reyes was attentive and engaged during today's processing group. Samuel Reyes shared that his biggest obstacle involves "getting my strength back and quitting drinking." Samuel Reyes stated that he feels weak and is scared that he will never regain his full strength.    Smart, Zanetta Dehaan LCSW 10/31/2015, 1:07 PM

## 2015-11-01 LAB — COMPREHENSIVE METABOLIC PANEL
ALT: 37 U/L (ref 17–63)
ANION GAP: 9 (ref 5–15)
AST: 35 U/L (ref 15–41)
Albumin: 3.1 g/dL — ABNORMAL LOW (ref 3.5–5.0)
Alkaline Phosphatase: 80 U/L (ref 38–126)
BUN: 12 mg/dL (ref 6–20)
CHLORIDE: 105 mmol/L (ref 101–111)
CO2: 27 mmol/L (ref 22–32)
Calcium: 8.6 mg/dL — ABNORMAL LOW (ref 8.9–10.3)
Creatinine, Ser: 0.76 mg/dL (ref 0.61–1.24)
Glucose, Bld: 79 mg/dL (ref 65–99)
POTASSIUM: 4.1 mmol/L (ref 3.5–5.1)
SODIUM: 141 mmol/L (ref 135–145)
Total Bilirubin: 0.3 mg/dL (ref 0.3–1.2)
Total Protein: 6.6 g/dL (ref 6.5–8.1)

## 2015-11-01 NOTE — [facility] Group Notes (Signed)
[place] LCSW Group Therapy  11/01/2015 2:21 PM  Type of Therapy:  Group Therapy  Participation Level:  Appropriate   Participation Quality:  Attentive  Affect:  Appropriate  Cognitive:  Alert and Oriented  Insight:  Improving  Engagement in Therapy:  Improving  Modes of Intervention:  Confrontation, Discussion, Education, Exploration, Problem-solving, Rapport Building, Socialization and Support  Summary of Progress/Problems: [facility] Speaker came to talk about his personal journey with substance abuse and addiction. The pt processed ways by which to relate to the speaker. [place] speaker provided handouts and educational information pertaining to groups and services offered by the [facility].   Smart, Deyton Ellenbecker LCSW 11/01/2015, 2:21 PM

## 2015-11-01 NOTE — Progress Notes (Signed)
[facility] MD Progress Note  11/01/2015 3:15 PM Samuel Reyes  MRN:  [id number] Subjective:  Samuel Reyes continues to endorse having pain feeling down depressed. He states that every time he goes trough this it gets worst. States he is not sure what to do from here. States he knows he needs more help but does not know  where to go from here Principal Problem: Major depressive disorder, recurrent episode, moderate with anxious distress ([place]) Diagnosis:   Patient Active Problem List   Diagnosis Date Noted  . Major depressive disorder, recurrent episode, moderate with anxious distress ([place]) [F33.1] 10/30/2015  . Alcohol use disorder, severe, dependence ([place]) [F10.20] 10/29/2015  . COPD exacerbation ([place]) [J44.1] 09/20/2015  . Acute respiratory failure with hypoxia ([place]) [J96.01] 09/18/2015  . Alcoholism /alcohol abuse ([place]) [F10.20]   . Suicidal ideation [R45.851] 09/17/2015  . Alcohol intoxication ([place]) [F10.129] 09/17/2015  . Depression [F32.9] 09/17/2015  . Benign essential HTN [I10] 09/17/2015  . Hypokalemia [E87.6] 09/17/2015  . Hyponatremia [E87.1] 09/17/2015  . Coffee ground emesis [K92.0] 09/17/2015  . COPD (chronic obstructive pulmonary disease) ([place]) [J44.9] 02/17/2013  . GERD (gastroesophageal reflux disease) [K21.9] 02/17/2013   Total Time spent with patient: 20 minutes  Past Psychiatric History: see admission H and P  Past Medical History:  Past Medical History  Diagnosis Date  . COPD (chronic obstructive pulmonary disease) [facility])     Past Surgical History  Procedure Laterality Date  . Gastrectomy    . Shoulder surgery Bilateral     3 surgeries on on left, 2 surgeries on right   . Rt knee arthroscopic surgery    . Back surgery      3 cervical spine surgeries C4-C5 fused  . Hernia repair    . Finger surgery Left     2nd, 3rd, & 4th fingers were cut off by table saw and reattached  . Colonoscopy N/A 01/04/2014    Procedure: COLONOSCOPY;  Surgeon: Danie Binder, MD;  [facility]  [facility];  Service: Endoscopy;  Laterality: N/A;  1:45  . Esophagogastroduodenoscopy N/A 01/04/2014    Procedure: ESOPHAGOGASTRODUODENOSCOPY (EGD);  Surgeon: Danie Binder, MD;  Location: [facility];  Service: Endoscopy;  Laterality: N/A;  . Incisional hernia repair N/A 01/20/2014    Procedure: LAPAROSCOPIC RECURRENT  INCISIONAL HERNIA with mesh;  Surgeon: Edward Jolly, MD;  Location: [facility];  Service: General;  Laterality: N/A;   Family History:  Family History  Problem Relation Age of Onset  . Cancer Father     bone  . Cancer Brother     lungs  . Stroke Maternal Grandmother   . Colon cancer Neg Hx   . Asthma Son     died at age 28 in his sleep   . Spina bifida Son     died at age 48   . Dementia Mother    Family Psychiatric  History: see admission H and P Social History:  History  Alcohol Use  . 39.0 oz/week  . 65 Cans of beer per week    Comment: 4-40oz today 1/2gallon vodka a month. 5 to 6 cases a beer a month.     History  Drug Use No    Comment: denid using any drugs    Social History   Social History  . Marital Status: Widowed    Spouse Name: N/A  . Number of Children: N/A  . Years of Education: N/A   Occupational History  . Disability    Social History  Main Topics  . Smoking status: Current Every Day Smoker -- 1.00 packs/day for 45 years    Types: Cigarettes    Start date: 07/30/1966  . Smokeless tobacco: Never Used  . Alcohol Use: 39.0 oz/week    65 Cans of beer per week     Comment: 4-40oz today 1/2gallon vodka a month. 5 to 6 cases a beer a month.  . Drug Use: No     Comment: denid using any drugs  . Sexual Activity: No   Other Topics Concern  . None   Social History Narrative   Additional Social History:                         Sleep: Fair  Appetite:  Fair  Current Medications: Current Facility-Administered Medications  Medication Dose Route Frequency Provider Last Rate Last Dose  . acetaminophen (TYLENOL) tablet  650 mg  650 mg Oral Q6H PRN Encarnacion Slates, NP   650 mg at 10/30/15 2209  . albuterol (PROVENTIL HFA;VENTOLIN HFA) 108 (90 Base) MCG/ACT inhaler 1-2 puff  1-2 puff Inhalation Q4H PRN Encarnacion Slates, NP      . alum & mag hydroxide-simeth (MAALOX/MYLANTA) 200-200-20 MG/5ML suspension 30 mL  30 mL Oral Q4H PRN Encarnacion Slates, NP      . benzonatate (TESSALON) capsule 100 mg  100 mg Oral TID Kerrie Buffalo, NP   100 mg at 11/01/15 1306  . budesonide (PULMICORT) nebulizer solution 0.25 mg  0.25 mg Nebulization BID Encarnacion Slates, NP   0.25 mg at 11/01/15 0825  . feeding supplement (ENSURE ENLIVE) (ENSURE ENLIVE) liquid 237 mL  237 mL Oral BID BM Saramma Eappen, MD   237 mL at 10/31/15 1546  . gabapentin (NEURONTIN) capsule 100 mg  100 mg Oral TID Encarnacion Slates, NP   100 mg at 11/01/15 1307  . ibuprofen (ADVIL,MOTRIN) tablet 600 mg  600 mg Oral Q6H PRN Kerrie Buffalo, NP   600 mg at 10/31/15 2211  . Ipratropium-Albuterol (COMBIVENT) respimat 1 puff  1 puff Inhalation Q6H PRN Encarnacion Slates, NP   1 puff at 10/30/15 1611  . lidocaine (LIDODERM) 5 % 1 patch  1 patch Transdermal Daily Ursula Alert, MD   1 patch at 11/01/15 0826  . LORazepam (ATIVAN) tablet 1 mg  1 mg Oral Q6H PRN Nicholaus Bloom, MD      . LORazepam (ATIVAN) tablet 1 mg  1 mg Oral QID Nicholaus Bloom, MD   1 mg at 11/01/15 1308   Followed by  . [START ON 11/02/2015] LORazepam (ATIVAN) tablet 1 mg  1 mg Oral TID Nicholaus Bloom, MD       Followed by  . [START ON 11/03/2015] LORazepam (ATIVAN) tablet 1 mg  1 mg Oral BID Nicholaus Bloom, MD       Followed by  . [START ON 11/04/2015] LORazepam (ATIVAN) tablet 1 mg  1 mg Oral Daily Nicholaus Bloom, MD      . magnesium hydroxide (MILK OF MAGNESIA) suspension 30 mL  30 mL Oral Daily PRN Encarnacion Slates, NP      . metoprolol tartrate (LOPRESSOR) tablet 25 mg  25 mg Oral QAC breakfast Encarnacion Slates, NP   25 mg at 11/01/15 0829  . mirtazapine (REMERON) tablet 15 mg  15 mg Oral QHS Ursula Alert, MD   15 mg at 10/31/15  2211  . multivitamin with minerals tablet 1 tablet  1 tablet Oral Daily Encarnacion Slates, NP   1 tablet at 11/01/15 [id number]  . nicotine (NICODERM CQ - dosed in mg/24 hours) patch 21 mg  21 mg Transdermal Daily Nicholaus Bloom, MD   21 mg at 11/01/15 0830  . pantoprazole (PROTONIX) EC tablet 40 mg  40 mg Oral Daily Encarnacion Slates, NP   40 mg at 11/01/15 0830  . thiamine (VITAMIN B-1) tablet 100 mg  100 mg Oral Daily Encarnacion Slates, NP   100 mg at 11/01/15 0830  . traZODone (DESYREL) tablet 50 mg  50 mg Oral QHS PRN Encarnacion Slates, NP   50 mg at 10/31/15 2211    Lab Results:  Results for orders placed or performed during the hospital encounter of 10/29/15 (from the past 48 hour(s))  Basic metabolic panel     Status: Abnormal   Collection Time: 10/31/15  6:30 PM  Result Value Ref Range   Sodium 138 135 - 145 mmol/L   Potassium 3.9 3.5 - 5.1 mmol/L   Chloride 104 101 - 111 mmol/L   CO2 26 22 - 32 mmol/L   Glucose, Bld 69 65 - 99 mg/dL   BUN 13 6 - 20 mg/dL   Creatinine, Ser 0.73 0.61 - 1.24 mg/dL   Calcium 8.4 (L) 8.9 - 10.3 mg/dL   GFR calc non Af Amer >60 >60 mL/min   GFR calc Af Amer >60 >60 mL/min    Comment: (NOTE) The eGFR has been calculated using the CKD EPI equation. This calculation has not been validated in all clinical situations. eGFR's persistently <60 mL/min signify possible Chronic Kidney Disease.    Anion gap 8 5 - 15    Comment: Performed at [facility]  TSH     Status: None   Collection Time: 10/31/15  6:30 PM  Result Value Ref Range   TSH 1.266 0.350 - 4.500 uIU/mL    Comment: Performed at [facility]    Blood Alcohol level:  Lab Results  Component Value Date   [facility] 209* 10/28/2015   [facility] 287* 09/17/2015    Physical Findings: AIMS: Facial and Oral Movements Muscles of Facial Expression: None, normal Lips and Perioral Area: None, normal Jaw: None, normal Tongue: None, normal,Extremity Movements Upper (arms, wrists, hands,  fingers): None, normal Lower (legs, knees, ankles, toes): None, normal, Trunk Movements Neck, shoulders, hips: None, normal, Overall Severity Severity of abnormal movements (highest score from questions above): None, normal Incapacitation due to abnormal movements: None, normal Patient's awareness of abnormal movements (rate only patient's report): No Awareness, Dental Status Current problems with teeth and/or dentures?: No Does patient usually wear dentures?: No  CIWA:  CIWA-Ar Total: 3 COWS:     Musculoskeletal: Strength & Muscle Tone: within normal limits Gait & Station: normal Patient leans: normal  Psychiatric Specialty Exam: Review of Systems  Constitutional: Positive for malaise/fatigue.  HENT:       Pounding HA  Eyes: Positive for blurred vision.  Respiratory: Positive for cough.   Cardiovascular: Negative.   Gastrointestinal: Positive for heartburn, nausea and vomiting.  Genitourinary: Negative.   Musculoskeletal: Positive for myalgias, back pain, joint pain and neck pain.  Skin: Negative.   Neurological: Positive for dizziness, weakness and headaches.  Endo/Heme/Allergies: Negative.   Psychiatric/Behavioral: Positive for depression and substance abuse. The patient is nervous/anxious and has insomnia.     Blood pressure 113/81, pulse 86, temperature 97.9 F (36.6 C), temperature source Oral, resp. rate 20, height  5' 5"  (1.651 m), weight 70.308 kg (155 lb), SpO2 95 %.Body mass index is 25.79 kg/(m^2).  General Appearance: Fairly Groomed  Engineer, water::  Fair  Speech:  Clear and Coherent and Slow  Volume:  Decreased  Mood:  Anxious, Depressed and Dysphoric  Affect:  Restricted and anxious worried  Thought Process:  Coherent and Goal Directed  Orientation:  Full (Time, Place, and Person)  Thought Content:  symptoms events worries concerns  Suicidal Thoughts:  No  Homicidal Thoughts:  No  Memory:  Immediate;   Fair Recent;   Fair Remote;   Fair  Judgement:  Fair   Insight:  Present and Shallow  Psychomotor Activity:  Restlessness  Concentration:  Fair  Recall:  [employer] of Knowledge:Fair  Language: Fair  Akathisia:  No  Handed:  Right  AIMS (if indicated):     Assets:  Desire for Improvement  ADL's:  Intact  Cognition: WNL  Sleep:  Number of Hours: 6.25   Treatment Plan Summary: Daily contact with patient to assess and evaluate symptoms and progress in treatment and Medication management Supportive approach/coping skills Alcohol dependence; continue Ativan detox protocol/work a relapse prevention plan Depression; continue the Remeron 15 mg HS Work with CBT/mindfulness Explore residential treatment options Vishruth Seoane A, MD 11/01/2015, 3:15 PM

## 2015-11-01 NOTE — Plan of Care (Signed)
Problem: Consults Goal: Depression Patient Education See Patient Education Module for education specifics.  Outcome: Progressing Nurse discussed depression/coping skills with patient.        

## 2015-11-01 NOTE — Progress Notes (Signed)
Recreation Therapy Notes  Animal-Assisted Activity (AAA) Program Checklist/Progress Notes Patient Eligibility Criteria Checklist & Daily Group note for Rec Tx Intervention  Date: 04.04.2017 Time: 2:45pm Location: [address]    AAA/T Program Assumption of Risk Form signed by Patient/ or Parent Legal Guardian Yes  Patient is free of allergies or sever asthma Yes  Patient reports no fear of animals Yes  Patient reports no history of cruelty to animals Yes  Patient understands his/her participation is voluntary Yes  Behavioral Response: Did not attend.     Laureen Ochs Horacio Werth, LRT/CTRS        Everlene Cunning L 11/01/2015 3:04 PM

## 2015-11-01 NOTE — Progress Notes (Signed)
Pt did not attend wrap up group meeting.

## 2015-11-01 NOTE — Progress Notes (Signed)
D   Pt is pleasant and cooperative on approach    He is compliant with treatment and interacts well with others    He uses a walker to ambulate    Pt reports wanting to get into long term treatment  And thinks there is a bed available for him tomorrow   He reports ready to discharge A    Verbal support given   Medications administered and effectiveness monitored    Q 15 min checks R   Pt safe at present

## 2015-11-01 NOTE — [facility] Group Notes (Signed)
Patient attend group.

## 2015-11-01 NOTE — Progress Notes (Signed)
D:  Patient's self inventory sheet, patient has fair sleep, sleep medication is helpful.  Fair appetite, normal energy level, poor concentration.  Rated depression 8, hopeless 7, anxiety 6.  Withdrawals, tremors, diarrhea, cramping.  Denied SI.  Physical problems, lightheaded, pain, headaches.  Physical pain, neck, head, no pain medication.  Goal is to getting to know myself.  Plans to feel better. A:  Medications administered per MD orders.  Emotional support and encouragement given patient. R:  Denied SI and HI, contracts for safety.  Denied A/V hallucinations.  Safety maintained with 15 minute checks.

## 2015-11-01 NOTE — [facility] Group Notes (Signed)

## 2015-11-02 MED ORDER — GUAIFENESIN ER 600 MG PO TB12
600.0000 mg | ORAL_TABLET | Freq: Two times a day (BID) | ORAL | Status: DC
  Administered 2015-11-02 – 2015-11-07 (×10): 600 mg via ORAL
  Filled 2015-11-02 (×17): qty 1

## 2015-11-02 MED ORDER — LEVOFLOXACIN 500 MG PO TABS
500.0000 mg | ORAL_TABLET | Freq: Every day | ORAL | Status: DC
  Administered 2015-11-02 – 2015-11-07 (×6): 500 mg via ORAL
  Filled 2015-11-02 (×2): qty 1
  Filled 2015-11-02: qty 2
  Filled 2015-11-02 (×2): qty 1
  Filled 2015-11-02: qty 2
  Filled 2015-11-02 (×3): qty 1

## 2015-11-02 NOTE — Progress Notes (Signed)
D:  Patient's self inventory sheet, patient has fair sleep, sleep medication was not helpful.  Poor appetite, low energy level, poor concentration.  Rated depression , hopeless and anxiety 7.  Denied withdrawals.  Denied SI.  Physical problems neck, worst pain in the past 24 hours is #9 neck pain.   Denied SI.  Goal is to feel better.  Plans to take medications and rest.  No discharge plans. A:  Medications administered per MD oders.  Emotional support and encouragement given patient. R:  Patient denied SI and HI, contracts for safety.  Safety maintained with 15 minute checks.

## 2015-11-02 NOTE — Progress Notes (Signed)
Recreation Therapy Notes  Date: 04.04.02017 Time: 9:30am Location: [address] Group Room   Group Topic: Stress Management  Goal Area(s) Addresses:  Patient will actively participate in stress management techniques presented during session.   Behavioral Response: Did not attend.   Laureen Ochs Alania [place], LRT/CTRS        Takhia Spoon L 11/02/2015 2:19 PM

## 2015-11-02 NOTE — Plan of Care (Signed)
Problem: Consults Goal: Suicide Risk Patient Education (See Patient Education module for education specifics)  Outcome: Progressing Nurse discussed depression/suicidal thoughts/coping skills with patient.     

## 2015-11-02 NOTE — [facility] Group Notes (Signed)
[place] LCSW Group Therapy  11/02/2015 3:12 PM  Type of Therapy:  Group Therapy  Participation Level:  Active  Participation Quality:  Attentive  Affect:  Appropriate  Cognitive:  Alert and Oriented  Insight:  Improving  Engagement in Therapy:  Improving  Modes of Intervention:  Confrontation, Discussion, Education, Exploration, Problem-solving, Rapport Building, Socialization and Support  Summary of Progress/Problems: Feelings around Diagnosis. Samuel Reyes was attentive and engaged during today's processing group. He shared that he feels misunderstood and judged by others due to his mental illness and substance abuse diagnosis. "My daughter said I'd be better off dead." Pt was tearful when talking about the lack of emotional support provided by his family. "I like to go to AA. I know those guys and feel good when I go." Samuel Reyes continues to show progress in the group setting with improving insight.   Smart, Randall Colden LCSW 11/02/2015, 3:12 PM

## 2015-11-02 NOTE — Progress Notes (Signed)
[facility] MD Progress Note  11/02/2015 6:13 PM Samuel Reyes  MRN:  [id number] Subjective:  Samuel Reyes continues to be detox. He is also dealing with a dry cough. He states he does not know what he is going to do when he gets out of here. He could go back to where he was but it is not a good environment as well as being in a second story and he not able to go upstairs Principal Problem: Major depressive disorder, recurrent episode, moderate with anxious distress ([place]) Diagnosis:   Patient Active Problem List   Diagnosis Date Noted  . Major depressive disorder, recurrent episode, moderate with anxious distress ([place]) [F33.1] 10/30/2015  . Alcohol use disorder, severe, dependence ([place]) [F10.20] 10/29/2015  . COPD exacerbation ([place]) [J44.1] 09/20/2015  . Acute respiratory failure with hypoxia ([place]) [J96.01] 09/18/2015  . Alcoholism /alcohol abuse ([place]) [F10.20]   . Suicidal ideation [R45.851] 09/17/2015  . Alcohol intoxication ([place]) [F10.129] 09/17/2015  . Depression [F32.9] 09/17/2015  . Benign essential HTN [I10] 09/17/2015  . Hypokalemia [E87.6] 09/17/2015  . Hyponatremia [E87.1] 09/17/2015  . Coffee ground emesis [K92.0] 09/17/2015  . COPD (chronic obstructive pulmonary disease) ([place]) [J44.9] 02/17/2013  . GERD (gastroesophageal reflux disease) [K21.9] 02/17/2013   Total Time spent with patient: 15 minutes  Past Psychiatric History: see admission H and P  Past Medical History:  Past Medical History  Diagnosis Date  . COPD (chronic obstructive pulmonary disease) [facility])     Past Surgical History  Procedure Laterality Date  . Gastrectomy    . Shoulder surgery Bilateral     3 surgeries on on left, 2 surgeries on right   . Rt knee arthroscopic surgery    . Back surgery      3 cervical spine surgeries C4-C5 fused  . Hernia repair    . Finger surgery Left     2nd, 3rd, & 4th fingers were cut off by table saw and reattached  . Colonoscopy N/A 01/04/2014    Procedure: COLONOSCOPY;  Surgeon: Danie Binder, MD;  Location: [facility] ENDO [facility];  Service: Endoscopy;  Laterality: N/A;  1:45  . Esophagogastroduodenoscopy N/A 01/04/2014    Procedure: ESOPHAGOGASTRODUODENOSCOPY (EGD);  Surgeon: Danie Binder, MD;  Location: [facility] ENDO SUITE;  Service: Endoscopy;  Laterality: N/A;  . Incisional hernia repair N/A 01/20/2014    Procedure: LAPAROSCOPIC RECURRENT  INCISIONAL HERNIA with mesh;  Surgeon: Edward Jolly, MD;  Location: [facility] ORS;  Service: General;  Laterality: N/A;   Family History:  Family History  Problem Relation Age of Onset  . Cancer Father     bone  . Cancer Brother     lungs  . Stroke Maternal Grandmother   . Colon cancer Neg Hx   . Asthma Son     died at age 53 in his sleep   . Spina bifida Son     died at age 94   . Dementia Mother    Family Psychiatric  History: see admission H and P Social History:  History  Alcohol Use  . 39.0 oz/week  . 65 Cans of beer per week    Comment: 4-40oz today 1/2gallon vodka a month. 5 to 6 cases a beer a month.     History  Drug Use No    Comment: denid using any drugs    Social History   Social History  . Marital Status: Widowed    Spouse Name: N/A  . Number of Children: N/A  . Years of Education:  N/A   Occupational History  . Disability    Social History Main Topics  . Smoking status: Current Every Day Smoker -- 1.00 packs/day for 45 years    Types: Cigarettes    Start date: 07/30/1966  . Smokeless tobacco: Never Used  . Alcohol Use: 39.0 oz/week    65 Cans of beer per week     Comment: 4-40oz today 1/2gallon vodka a month. 5 to 6 cases a beer a month.  . Drug Use: No     Comment: denid using any drugs  . Sexual Activity: No   Other Topics Concern  . None   Social History Narrative   Additional Social History:                         Sleep: Fair  Appetite:  Fair  Current Medications: Current Facility-Administered Medications  Medication Dose Route Frequency Provider Last Rate Last Dose  .  acetaminophen (TYLENOL) tablet 650 mg  650 mg Oral Q6H PRN Encarnacion Slates, NP   650 mg at 11/02/15 1605  . albuterol (PROVENTIL HFA;VENTOLIN HFA) 108 (90 Base) MCG/ACT inhaler 1-2 puff  1-2 puff Inhalation Q4H PRN Encarnacion Slates, NP      . alum & mag hydroxide-simeth (MAALOX/MYLANTA) 200-200-20 MG/5ML suspension 30 mL  30 mL Oral Q4H PRN Encarnacion Slates, NP      . budesonide (PULMICORT) nebulizer solution 0.25 mg  0.25 mg Nebulization BID Encarnacion Slates, NP   0.25 mg at 11/02/15 1621  . feeding supplement (ENSURE ENLIVE) (ENSURE ENLIVE) liquid 237 mL  237 mL Oral BID BM Saramma Eappen, MD   237 mL at 10/31/15 1546  . gabapentin (NEURONTIN) capsule 100 mg  100 mg Oral TID Encarnacion Slates, NP   100 mg at 11/02/15 1622  . guaiFENesin (MUCINEX) 12 hr tablet 600 mg  600 mg Oral BID Nicholaus Bloom, MD   600 mg at 11/02/15 1624  . ibuprofen (ADVIL,MOTRIN) tablet 600 mg  600 mg Oral Q6H PRN Kerrie Buffalo, NP   600 mg at 11/02/15 0747  . Ipratropium-Albuterol (COMBIVENT) respimat 1 puff  1 puff Inhalation Q6H PRN Encarnacion Slates, NP   1 puff at 10/30/15 1611  . levofloxacin (LEVAQUIN) tablet 500 mg  500 mg Oral Daily Nicholaus Bloom, MD   500 mg at 11/02/15 1644  . lidocaine (LIDODERM) 5 % 1 patch  1 patch Transdermal Daily Ursula Alert, MD   1 patch at 11/02/15 0734  . LORazepam (ATIVAN) tablet 1 mg  1 mg Oral Q6H PRN Nicholaus Bloom, MD      . Derrill Memo ON 11/03/2015] LORazepam (ATIVAN) tablet 1 mg  1 mg Oral BID Nicholaus Bloom, MD       Followed by  . [START ON 11/04/2015] LORazepam (ATIVAN) tablet 1 mg  1 mg Oral Daily Nicholaus Bloom, MD      . magnesium hydroxide (MILK OF MAGNESIA) suspension 30 mL  30 mL Oral Daily PRN Encarnacion Slates, NP      . metoprolol tartrate (LOPRESSOR) tablet 25 mg  25 mg Oral QAC breakfast Encarnacion Slates, NP   25 mg at 11/02/15 0615  . mirtazapine (REMERON) tablet 15 mg  15 mg Oral QHS Ursula Alert, MD   15 mg at 11/01/15 2120  . multivitamin with minerals tablet 1 tablet  1 tablet Oral Daily  Encarnacion Slates, NP   1 tablet at 11/02/15 0734  .  nicotine (NICODERM CQ - dosed in mg/24 hours) patch 21 mg  21 mg Transdermal Daily Nicholaus Bloom, MD   21 mg at 11/02/15 0735  . pantoprazole (PROTONIX) EC tablet 40 mg  40 mg Oral Daily Encarnacion Slates, NP   40 mg at 11/02/15 0735  . thiamine (VITAMIN B-1) tablet 100 mg  100 mg Oral Daily Encarnacion Slates, NP   100 mg at 11/02/15 0735  . traZODone (DESYREL) tablet 50 mg  50 mg Oral QHS PRN Encarnacion Slates, NP   50 mg at 11/01/15 2120    Lab Results:  Results for orders placed or performed during the hospital encounter of 10/29/15 (from the past 48 hour(s))  Basic metabolic panel     Status: Abnormal   Collection Time: 10/31/15  6:30 PM  Result Value Ref Range   Sodium 138 135 - 145 mmol/L   Potassium 3.9 3.5 - 5.1 mmol/L   Chloride 104 101 - 111 mmol/L   CO2 26 22 - 32 mmol/L   Glucose, Bld 69 65 - 99 mg/dL   BUN 13 6 - 20 mg/dL   Creatinine, Ser 0.73 0.61 - 1.24 mg/dL   Calcium 8.4 (L) 8.9 - 10.3 mg/dL   GFR calc non Af Amer >60 >60 mL/min   GFR calc Af Amer >60 >60 mL/min    Comment: (NOTE) The eGFR has been calculated using the CKD EPI equation. This calculation has not been validated in all clinical situations. eGFR's persistently <60 mL/min signify possible Chronic Kidney Disease.    Anion gap 8 5 - 15    Comment: Performed at [facility]  TSH     Status: None   Collection Time: 10/31/15  6:30 PM  Result Value Ref Range   TSH 1.266 0.350 - 4.500 uIU/mL    Comment: Performed at [name]  Comprehensive metabolic panel     Status: Abnormal   Collection Time: 11/01/15  7:05 PM  Result Value Ref Range   Sodium 141 135 - 145 mmol/L   Potassium 4.1 3.5 - 5.1 mmol/L   Chloride 105 101 - 111 mmol/L   CO2 27 22 - 32 mmol/L   Glucose, Bld 79 65 - 99 mg/dL   BUN 12 6 - 20 mg/dL   Creatinine, Ser 0.76 0.61 - 1.24 mg/dL   Calcium 8.6 (L) 8.9 - 10.3 mg/dL   Total Protein 6.6 6.5 - 8.1 g/dL    Albumin 3.1 (L) 3.5 - 5.0 g/dL   AST 35 15 - 41 U/L   ALT 37 17 - 63 U/L   Alkaline Phosphatase 80 38 - 126 U/L   Total Bilirubin 0.3 0.3 - 1.2 mg/dL   GFR calc non Af Amer >60 >60 mL/min   GFR calc Af Amer >60 >60 mL/min    Comment: (NOTE) The eGFR has been calculated using the CKD EPI equation. This calculation has not been validated in all clinical situations. eGFR's persistently <60 mL/min signify possible Chronic Kidney Disease.    Anion gap 9 5 - 15    Comment: Performed at [facility]    Blood Alcohol level:  Lab Results  Component Value Date   [facility] 209* 10/28/2015   ETH 287* 09/17/2015    Physical Findings: AIMS: Facial and Oral Movements Muscles of Facial Expression: None, normal Lips and Perioral Area: None, normal Jaw: None, normal Tongue: None, normal,Extremity Movements Upper (arms, wrists, hands, fingers): None, normal Lower (legs, knees,  ankles, toes): None, normal, Trunk Movements Neck, shoulders, hips: None, normal, Overall Severity Severity of abnormal movements (highest score from questions above): None, normal Incapacitation due to abnormal movements: None, normal Patient's awareness of abnormal movements (rate only patient's report): No Awareness, Dental Status Current problems with teeth and/or dentures?: No Does patient usually wear dentures?: No  CIWA:  CIWA-Ar Total: 1 COWS:  COWS Total Score: 3  Musculoskeletal: Strength & Muscle Tone: within normal limits Gait & Station: unsteady Patient leans: uses a walker  Psychiatric Specialty Exam: Review of Systems  Constitutional: Positive for malaise/fatigue.  HENT: Negative.   Eyes: Negative.   Respiratory: Positive for cough.   Cardiovascular: Negative.   Gastrointestinal: Negative.   Genitourinary: Negative.   Musculoskeletal: Negative.   Skin: Negative.   Neurological: Positive for weakness.  Endo/Heme/Allergies: Negative.   Psychiatric/Behavioral: Positive for  depression and substance abuse. The patient is nervous/anxious.     Blood pressure 143/85, pulse 110, temperature 98.9 F (37.2 C), temperature source Oral, resp. rate 16, height 5' 5"  (1.651 m), weight 70.308 kg (155 lb), SpO2 95 %.Body mass index is 25.79 kg/(m^2).  General Appearance: Fairly Groomed  Engineer, water::  Fair  Speech:  Clear and Coherent and Slow  Volume:  Decreased  Mood:  Anxious, Depressed and worried  Affect:  anxious worried  Thought Process:  Coherent and Goal Directed  Orientation:  Full (Time, Place, and Person)  Thought Content:  symptoms events worries concerns  Suicidal Thoughts:  No  Homicidal Thoughts:  No  Memory:  Immediate;   Fair Recent;   Fair Remote;   Fair  Judgement:  Fair  Insight:  Present and Shallow  Psychomotor Activity:  Restlessness  Concentration:  Fair  Recall:  [employer] of Knowledge:Fair  Language: Fair  Akathisia:  No  Handed:  Right  AIMS (if indicated):     Assets:  Desire for Improvement  ADL's:  Intact  Cognition: WNL  Sleep:  Number of Hours: 6.75   Treatment Plan Summary: Daily contact with patient to assess and evaluate symptoms and progress in treatment and Medication management Supportive approach/coping skills Alcohol dependence; continue the detox protocol/work a relapse prevention plan Mood instability/anxiety/pain: Neurontin  100 mg TID and optimize dose response Insomnia; Remeron 15 mg HS  Cough; will start an antibiotic/start Mucinex /use and inhaler Work with CBT/mindfulness Evaluate residential treatment  or other placement options Bettylou Frew A, MD 11/02/2015, 6:13 PM

## 2015-11-02 NOTE — Progress Notes (Signed)
D:Patient in the bed on approach.  Patient states he has been tired all day.  Patient states his goal for today was to get rid of his cold.  Patient states he did not meet his goal because he feels the same.  Patient denies SI/HI and denies AVH.  Patient denies withdrawal symptoms. A: Staff to monitor Q 15 mins for safety.  Encouragement and support offered.  No cheduled medications administered per orders patient was asleep. R: Patient remains safe on the unit.  Patient did not attend group tonight.  Patient not visible on the unit.  Patient not taking administered medications patient extremely drowsy.

## 2015-11-02 NOTE — BHH Group Notes (Signed)
[facility] LCSW Aftercare Discharge Planning Group Note   11/02/2015 1:10 PM  Participation Quality:  Appropriate   Mood/Affect:  Appropriate  Depression Rating:  8  Anxiety Rating:  7  Thoughts of Suicide:  No Will you contract for safety?   NA  Current AVH:  No  Plan for Discharge/Comments:  Pt reports that he still feels like he has a bad cold. Pt reports shakes and general achiness all over with poor sleep. Pt plans to follow-up outpatient "somewhere" but has not committed to a plan at this point. [facility] referral has been sent.   Transportation Means: unknown at this time.   Supports: none identified by pt.   Smart, Macguire Holsinger LCSW

## 2015-11-02 NOTE — Progress Notes (Signed)
Pt did not attend NA meeting this evening. Pt stayed in the bed sleeping.  

## 2015-11-03 MED ORDER — TRAZODONE HCL 100 MG PO TABS
100.0000 mg | ORAL_TABLET | Freq: Every evening | ORAL | Status: DC | PRN
  Administered 2015-11-03 – 2015-11-06 (×4): 100 mg via ORAL
  Filled 2015-11-03 (×4): qty 1

## 2015-11-03 NOTE — BHH Group Notes (Signed)
[place] LCSW Group Therapy  11/03/2015 2:44 PM  Type of Therapy:  Group Therapy  Participation Level:  Active  Participation Quality:  Attentive  Affect:  Appropriate  Cognitive:  Alert and Oriented  Insight:  Improving  Engagement in Therapy:  Improving  Modes of Intervention:  Confrontation, Discussion, Education, Exploration, Problem-solving, Rapport Building, Socialization and Support  Summary of Progress/Problems: Self Sabotaging Behaviors. Samuel Reyes was attentive and engaged during today's processing group. He was able to identify with several of the listed self sabotaging behaviors listed by the CSW including: "comparing myself to others, negative thinking, over thinking, and having no purpose." Samuel Reyes shared that his family has given up on him and he feels a lack of support. He was able to identify "the [place] group" as a source of potential support. "I just have to get over the shame of relapsing and make another appearance there." He continues to show progress in the group setting with improving insight.    Smart, Wilba Mutz LCSW 11/03/2015, 2:44 PM

## 2015-11-03 NOTE — Progress Notes (Signed)
Psychoeducational Group Note  Date:  11/03/2015 Time:  2100  Group Topic/Focus:  wrap up group  Participation Level: Did Not Attend  Participation Quality:  Not Applicable  Affect:  Not Applicable  Cognitive:  Not Applicable  Insight:  Not Applicable  Engagement in Group: Not Applicable  Additional Comments:  Pt was notified that group was beginning but remained in bed.   Shellia Cleverly 11/03/2015, 9:52 PM

## 2015-11-03 NOTE — Plan of Care (Signed)
Problem: Alteration in mood Goal: STG-Patient reports thoughts of self-harm to staff Outcome: Progressing Patient denies  SI, thoughts to self harm.  Problem: Diagnosis: Increased Risk For Suicide Attempt Goal: STG-Patient Will Comply With Medication Regime Outcome: Progressing Patient is med compliant.

## 2015-11-03 NOTE — Progress Notes (Signed)
D:Patient in the dayroom on approach.  Patient appears brighter today.  Patient states he has been more active on the unit tonight.  Patient states his withdrawal symptoms are slim to none.  Patient states his goal was to feel better today.  Patient denies SI/HI and denies AVH. A: Staff to monitor Q 15 mins for safety.  Encouragement and support offered.  Scheduled medications administered per orders.  Trazodone administered prn for sleep.  Ibuprofen administered prn for pain.   R: Patient remains safe on the unit.  Patient did not attend group tonight.  Patient visible on the unit.  Patient taking administered medications.

## 2015-11-03 NOTE — Progress Notes (Signed)
Patient ID: Samuel Reyes, male   DOB: Aug 19, 1955, 60 y.o.   MRN: [id number] [facility] MD Progress Note  11/03/2015 3:12 PM Samuel Reyes  MRN:  [id number]  Subjective:  Samuel Reyes continues the [facility] detox protocols. He says he is hanging in there. Says his cough is better. Is complaining of headache pain, pain on his neck areas. Still presenting with some tremors. He is worrying about what to do & where to go after discharge. Says he drinks a lot because of loneliness & not having a support system. He rates his depression #7 & anxiety #8. Samuel Reyes walks with a walker for support during ambulation due to weak gait & balance. He says he is not sleeping well at night. Would like to go to a boarding house after discharge with a room on the first floor. He currently denies any SIHI, AVH.  Principal Problem: Major depressive disorder, recurrent episode, moderate with anxious distress ([place]), Alcohol use disorder, severe, dependence ([place])  Diagnosis:   Patient Active Problem List   Diagnosis Date Noted  . Major depressive disorder, recurrent episode, moderate with anxious distress ([place]) [F33.1] 10/30/2015  . Alcohol use disorder, severe, dependence ([place]) [F10.20] 10/29/2015  . COPD exacerbation ([place]) [J44.1] 09/20/2015  . Acute respiratory failure with hypoxia ([place]) [J96.01] 09/18/2015  . Alcoholism /alcohol abuse ([place]) [F10.20]   . Suicidal ideation [R45.851] 09/17/2015  . Alcohol intoxication ([place]) [F10.129] 09/17/2015  . Depression [F32.9] 09/17/2015  . Benign essential HTN [I10] 09/17/2015  . Hypokalemia [E87.6] 09/17/2015  . Hyponatremia [E87.1] 09/17/2015  . Coffee ground emesis [K92.0] 09/17/2015  . COPD (chronic obstructive pulmonary disease) ([place]) [J44.9] 02/17/2013  . GERD (gastroesophageal reflux disease) [K21.9] 02/17/2013   Total Time spent with patient: 15 minutes  Past Psychiatric History: See admission H&P  Past Medical History:  Past Medical History  Diagnosis Date  . COPD (chronic  obstructive pulmonary disease) [facility])     Past Surgical History  Procedure Laterality Date  . Gastrectomy    . Shoulder surgery Bilateral     3 surgeries on on left, 2 surgeries on right   . Rt knee arthroscopic surgery    . Back surgery      3 cervical spine surgeries C4-C5 fused  . Hernia repair    . Finger surgery Left     2nd, 3rd, & 4th fingers were cut off by table saw and reattached  . Colonoscopy N/A 01/04/2014    Procedure: COLONOSCOPY;  Surgeon: Danie Binder, MD;  Location: [facility] ENDO SU[facility];  Service: Endoscopy;  Laterality: N/A;  1:45  . Esophagogastroduodenoscopy N/A 01/04/2014    Procedure: ESOPHAGOGASTRODUODENOSCOPY (EGD);  Surgeon: Danie Binder, MD;  Location: [facility] ENDO SUITE;  Service: Endoscopy;  Laterality: N/A;  . Incisional hernia repair N/A 01/20/2014    Procedure: LAPAROSCOPIC RECURRENT  INCISIONAL HERNIA with mesh;  Surgeon: Edward Jolly, MD;  Location: [facility] ORS;  Service: General;  Laterality: N/A;   Family History:  Family History  Problem Relation Age of Onset  . Cancer Father     bone  . Cancer Brother     lungs  . Stroke Maternal Grandmother   . Colon cancer Neg Hx   . Asthma Son     died at age 38 in his sleep   . Spina bifida Son     died at age 5   . Dementia Mother    Family Psychiatric  History: See admission H and P  Social History:  History  Alcohol Use  .  39.0 oz/week  . 65 Cans of beer per week    Comment: 4-40oz today 1/2gallon vodka a month. 5 to 6 cases a beer a month.     History  Drug Use No    Comment: denid using any drugs    Social History   Social History  . Marital Status: Widowed    Spouse Name: N/A  . Number of Children: N/A  . Years of Education: N/A   Occupational History  . Disability    Social History Main Topics  . Smoking status: Current Every Day Smoker -- 1.00 packs/day for 45 years    Types: Cigarettes    Start date: 07/30/1966  . Smokeless tobacco: Never Used  . Alcohol Use: 39.0 oz/week    65  Cans of beer per week     Comment: 4-40oz today 1/2gallon vodka a month. 5 to 6 cases a beer a month.  . Drug Use: No     Comment: denid using any drugs  . Sexual Activity: No   Other Topics Concern  . None   Social History Narrative   Additional Social History:   Sleep: Poor per patient's reports.  Appetite:  Fair  Current Medications: Current Facility-Administered Medications  Medication Dose Route Frequency Provider Last Rate Last Dose  . acetaminophen (TYLENOL) tablet 650 mg  650 mg Oral Q6H PRN Encarnacion Slates, NP   650 mg at 11/02/15 1605  . albuterol (PROVENTIL HFA;VENTOLIN HFA) 108 (90 Base) MCG/ACT inhaler 1-2 puff  1-2 puff Inhalation Q4H PRN Encarnacion Slates, NP      . alum & mag hydroxide-simeth (MAALOX/MYLANTA) 200-200-20 MG/5ML suspension 30 mL  30 mL Oral Q4H PRN Encarnacion Slates, NP      . budesonide (PULMICORT) nebulizer solution 0.25 mg  0.25 mg Nebulization BID Encarnacion Slates, NP   0.25 mg at 11/03/15 1102  . feeding supplement (ENSURE ENLIVE) (ENSURE ENLIVE) liquid 237 mL  237 mL Oral BID BM Saramma Eappen, MD   237 mL at 11/03/15 1102  . gabapentin (NEURONTIN) capsule 100 mg  100 mg Oral TID Encarnacion Slates, NP   100 mg at 11/03/15 1103  . guaiFENesin (MUCINEX) 12 hr tablet 600 mg  600 mg Oral BID Nicholaus Bloom, MD   600 mg at 11/03/15 0829  . ibuprofen (ADVIL,MOTRIN) tablet 600 mg  600 mg Oral Q6H PRN Kerrie Buffalo, NP   600 mg at 11/03/15 0829  . Ipratropium-Albuterol (COMBIVENT) respimat 1 puff  1 puff Inhalation Q6H PRN Encarnacion Slates, NP   1 puff at 10/30/15 1611  . levofloxacin (LEVAQUIN) tablet 500 mg  500 mg Oral Daily Nicholaus Bloom, MD   500 mg at 11/03/15 0829  . lidocaine (LIDODERM) 5 % 1 patch  1 patch Transdermal Daily Ursula Alert, MD   1 patch at 11/03/15 0830  . LORazepam (ATIVAN) tablet 1 mg  1 mg Oral Q6H PRN Nicholaus Bloom, MD      . LORazepam (ATIVAN) tablet 1 mg  1 mg Oral BID Nicholaus Bloom, MD   1 mg at 11/03/15 0829   Followed by  . [START ON  11/04/2015] LORazepam (ATIVAN) tablet 1 mg  1 mg Oral Daily Nicholaus Bloom, MD      . magnesium hydroxide (MILK OF MAGNESIA) suspension 30 mL  30 mL Oral Daily PRN Encarnacion Slates, NP      . metoprolol tartrate (LOPRESSOR) tablet 25 mg  25 mg Oral QAC breakfast  Encarnacion Slates, NP   25 mg at 11/03/15 0631  . mirtazapine (REMERON) tablet 15 mg  15 mg Oral QHS Ursula Alert, MD   15 mg at 11/01/15 2120  . multivitamin with minerals tablet 1 tablet  1 tablet Oral Daily Encarnacion Slates, NP   1 tablet at 11/03/15 0829  . nicotine (NICODERM CQ - dosed in mg/24 hours) patch 21 mg  21 mg Transdermal Daily Nicholaus Bloom, MD   21 mg at 11/03/15 0830  . pantoprazole (PROTONIX) EC tablet 40 mg  40 mg Oral Daily Encarnacion Slates, NP   40 mg at 11/03/15 0829  . thiamine (VITAMIN B-1) tablet 100 mg  100 mg Oral Daily Encarnacion Slates, NP   100 mg at 11/03/15 0830  . traZODone (DESYREL) tablet 50 mg  50 mg Oral QHS PRN Encarnacion Slates, NP   50 mg at 11/01/15 2120   Lab Results:  Results for orders placed or performed during the hospital encounter of 10/29/15 (from the past 48 hour(s))  Comprehensive metabolic panel     Status: Abnormal   Collection Time: 11/01/15  7:05 PM  Result Value Ref Range   Sodium 141 135 - 145 mmol/L   Potassium 4.1 3.5 - 5.1 mmol/L   Chloride 105 101 - 111 mmol/L   CO2 27 22 - 32 mmol/L   Glucose, Bld 79 65 - 99 mg/dL   BUN 12 6 - 20 mg/dL   Creatinine, Ser 0.76 0.61 - 1.24 mg/dL   Calcium 8.6 (L) 8.9 - 10.3 mg/dL   Total Protein 6.6 6.5 - 8.1 g/dL   Albumin 3.1 (L) 3.5 - 5.0 g/dL   AST 35 15 - 41 U/L   ALT 37 17 - 63 U/L   Alkaline Phosphatase 80 38 - 126 U/L   Total Bilirubin 0.3 0.3 - 1.2 mg/dL   GFR calc non Af Amer >60 >60 mL/min   GFR calc Af Amer >60 >60 mL/min    Comment: (NOTE) The eGFR has been calculated using the CKD EPI equation. This calculation has not been validated in all clinical situations. eGFR's persistently <60 mL/min signify possible Chronic Kidney Disease.     Anion gap 9 5 - 15    Comment: Performed at [facility]    Blood Alcohol level:  Lab Results  Component Value Date   [facility] 209* 10/28/2015   [facility] 287* 09/17/2015   Physical Findings: AIMS: Facial and Oral Movements Muscles of Facial Expression: None, normal Lips and Perioral Area: None, normal Jaw: None, normal Tongue: None, normal,Extremity Movements Upper (arms, wrists, hands, fingers): None, normal Lower (legs, knees, ankles, toes): None, normal, Trunk Movements Neck, shoulders, hips: None, normal, Overall Severity Severity of abnormal movements (highest score from questions above): None, normal Incapacitation due to abnormal movements: None, normal Patient's awareness of abnormal movements (rate only patient's report): No Awareness, Dental Status Current problems with teeth and/or dentures?: No Does patient usually wear dentures?: No  CIWA:  CIWA-Ar Total: 1 COWS:  COWS Total Score: 3  Musculoskeletal: Strength & Muscle Tone: within normal limits Gait & Station: unsteady Patient leans: uses a walker  Psychiatric Specialty Exam: Review of Systems  Constitutional: Positive for malaise/fatigue.  HENT: Negative.   Eyes: Negative.   Respiratory: Positive for cough.   Cardiovascular: Negative.   Gastrointestinal: Negative.   Genitourinary: Negative.   Musculoskeletal: Negative.   Skin: Negative.   Neurological: Positive for weakness.  Endo/Heme/Allergies: Negative.   Psychiatric/Behavioral:  Positive for depression and substance abuse. The patient is nervous/anxious.     Blood pressure 133/84, pulse 119, temperature 98.9 F (37.2 C), temperature source Oral, resp. rate 16, height 5' 5"  (1.651 m), weight 70.308 kg (155 lb), SpO2 95 %.Body mass index is 25.79 kg/(m^2).  General Appearance: Fairly Groomed  Engineer, water::  Fair  Speech:  Clear and Coherent and Slow  Volume:  Decreased  Mood:  Anxious, Depressed and worried  Affect:  anxious worried   Thought Process:  Coherent and Goal Directed  Orientation:  Full (Time, Place, and Person)  Thought Content:  symptoms events worries concerns  Suicidal Thoughts:  No  Homicidal Thoughts:  No  Memory:  Immediate;   Fair Recent;   Fair Remote;   Fair  Judgement:  Fair  Insight:  Present and Shallow  Psychomotor Activity:  Restlessness  Concentration:  Fair  Recall:  [employer] of Knowledge:Fair  Language: Fair  Akathisia:  No  Handed:  Right  AIMS (if indicated):     Assets:  Desire for Improvement  ADL's:  Intact  Cognition: WNL  Sleep:  Number of Hours: 6.75   Treatment Plan Summary: Daily contact with patient to assess and evaluate symptoms and progress in treatment and Medication management Supportive approach/coping skills Alcohol dependence; continue the detox protocol/work a relapse prevention plan Mood instability/anxiety/pain: Neurontin  100 mg TID and optimize dose response Insomnia; Remeron 15 mg HS, increased Trazodone to 100 mg Qhs due to poor sleep.  Cough; continue antibiotic therapy & Mucinex tabs /use and inhaler Work with CBT/mindfulness Evaluate residential treatment  or other placement options  Encarnacion Slates, NP, PMHNP-BC 11/03/2015, 3:12 PM I agree with assessment and plan Geralyn Flash A. Sabra Heck, M.D.

## 2015-11-03 NOTE — BHH Group Notes (Signed)
[facility] [facility] Group Notes:  (Nursing/MHT/Case Management/Adjunct)  Date:  11/03/2015  Time:  0900  Type of Therapy:  Nurse Education  Participation Level:  Did Not Attend  Participation Quality:    Affect:    Cognitive:    Insight:    Engagement in Group:    Modes of Intervention:    Summary of Progress/Problems: Patient unable to attend group as he continues to feel poorly due to viral infection. Resting in bed.  Loletta Specter [facility] 11/03/2015, 0930

## 2015-11-03 NOTE — Progress Notes (Signed)
Patient initially in bed this AM reporting generalized body aches (8/10) and continued cough and congestion. Asked that meds be brought to him. Patient did arise mid morning. Affect flat, mood depressed. Rates his depression at an 8/10, hopelessness and anxiety both at a 7/10. Ambulating with walker, remains unsteady. Medicated per orders. Advil given for pain with good results. Self inventory reviewed. Emotional support provided. Fall teaching reviewed and precautions in place. Patient verbalized understanding. He denies SI/HI and remains safe on level III obs.

## 2015-11-04 NOTE — BHH Group Notes (Signed)
[place] LCSW Group Therapy  11/04/2015 1:17 PM  Type of Therapy:  Group Therapy  Participation Level:  Active  Participation Quality:  Attentive  Affect:  Appropriate  Cognitive:  Alert and Oriented  Insight:  Improving  Engagement in Therapy:  Improving  Modes of Intervention:  Confrontation, Discussion, Education, Exploration, Problem-solving, Rapport Building, Socialization and Support  Summary of Progress/Problems: Feelings around Relapse. Group members discussed the meaning of relapse and shared personal stories of relapse, how it affected them and others, and how they perceived themselves during this time. Group members were encouraged to identify triggers, warning signs and coping skills used when facing the possibility of relapse. Social supports were discussed and explored in detail. Post Acute Withdrawal Syndrome (handout provided) was introduced and examined. Pt's were encouraged to ask questions, talk about key points associated with PAWS, and process this information in terms of relapse prevention. Samuel Reyes was attentive and engaged during today's processing group. He shared that he struggles with the symptoms of PAWS and often does not utilize support system and relapses. Samuel Reyes stated that he plans to attend AA on [place] and reconnect with people there in order to maintain his sobriety.   Smart, Luria Rosario LCSW 11/04/2015, 1:17 PM

## 2015-11-04 NOTE — BHH Group Notes (Signed)
Pt attended [place] meeting.  [name], MHT

## 2015-11-04 NOTE — Tx Team (Signed)
Interdisciplinary Treatment Plan Update (Adult)  Date:  11/04/2015  Time Reviewed:  1:25 PM   Progress in Treatment: Attending groups: Yes. Participating in groups:  Yes. Taking medication as prescribed:  Yes. Tolerating medication:  Yes. Family/Significant othe contact made:  SPE required for pt.  Patient understands diagnosis:  Yes. and As evidenced by:  seeking treatment for alcohol abuse, depression, SI, and for medication management Discussing patient identified problems/goals with staff:  Yes. Medical problems stabilized or resolved:  Yes. Denies suicidal/homicidal ideation: Yes. Issues/concerns per patient self-inventory:  Other:  Discharge Plan or Barriers: CSW assessing for appropriate referrals. Pt is unsure if he wants to pursue inpatient or outpatient treatment at this time. Pt reports that he is living on second story of boarding house and needs to move due to his physical limitations "I have a hard time going up stairs."   Reason for Continuation of Hospitalization: Depression Medication stabilization Withdrawal symptoms  Comments:  Samuel Reyes is an 60 y.o. male who came to the Emergency Department due to severe alcohol abuse and depression with some suicidal ideations. He states that he "doesn't want to live anymore". When asked if he wanted to kill himself he states "I tried that, it didn't work." He states that he tried to slit his wrists 4 years ago and overdosed on medication 2 years ago. He was not admitted inpatient either of these times. He was admitted to [facility] in 2016 for alcohol dependence. He states that he started drinking when he was 2 after his first son died and now drinks 5-9 40oz beers daily. He states his last drink was at 9am this morning. He reports that he has bad withdrawal symptoms and is starting to "get the shakes". He states that he also lost his other son 6 years ago and his wife 3 years ago. He says that he has been having a lot of depression  since then and just "wants to die" to not be in pain anymore. He denies HI or A/V hallucinations. Diagnosis: Major Depressive Disorder Recurrent Severe, Alcohol use disorder severe    Estimated length of stay:  3-4 days   New goal(s): to develop effective aftercare plan.   Additional Comments:  Patient and CSW reviewed pt's identified goals and treatment plan. Patient verbalized understanding and agreed to treatment plan. CSW reviewed [facility] "Discharge Process and Patient Involvement" Form. Pt verbalized understanding of information provided and signed form.    Review of initial/current patient goals per problem list:  1. Goal(s): Patient will participate in aftercare plan  Met: Goal progressing.   Target date: at discharge  As evidenced by: Patient will participate within aftercare plan AEB aftercare provider and housing plan at discharge being identified.  4/3: CSW assessing for appropriate referrals.   4/7: Pt has [facility] screening on Tuesday. Plans to return home and followup at [facility] and is trying to find different boarding house or room to rent. Unsure if he wants to pursue [facility] at this time.   2. Goal (s): Patient will exhibit decreased depressive symptoms and suicidal ideations.  Met: No.    Target date: at discharge  As evidenced by: Patient will utilize self rating of depression at 3 or below and demonstrate decreased signs of depression or be deemed stable for discharge by MD.  4/3: Pt rates depression as 8/10 and presents with depressed mood/flat affect. He denies SI/HI/AVH today.   4/7: Pt rates depression as 8. Depressed mood.   3. Goal(s): Patient will demonstrate decreased  signs of withdrawal due to substance abuse  Met:No.   Target date:at discharge   As evidenced by: Patient will produce a CIWA/COWS score of 0, have stable vitals signs, and no symptoms of withdrawal.  4/3: Pt reports mild withdrawals today with CIWA score of 4 and stable vitals.  Goal progressing.   4/7: Pt reports mild withdrawals with CIWA of 1 and high pulse. Goal continuing to progress.   Attendees: Patient:   11/04/2015 1:25 PM   Family:   11/04/2015 1:25 PM   Physician:  Dr. Carlton Adam, MD 11/04/2015 1:25 PM   Nursing:   Jasmine Awe RN  11/04/2015 1:25 PM   Clinical Social Worker: Maxie Better, LCSW 11/04/2015 1:25 PM   Clinical Social Worker: 11/04/2015 1:25 PM   Other:  Gerline Legacy Nurse Case Manager 11/04/2015 1:25 PM   Other:  Agustina Caroli NP  11/04/2015 1:25 PM   Other:   11/04/2015 1:25 PM   Other:  11/04/2015 1:25 PM   Other:  11/04/2015 1:25 PM   Other:  11/04/2015 1:25 PM    11/04/2015 1:25 PM    11/04/2015 1:25 PM    11/04/2015 1:25 PM    11/04/2015 1:25 PM    Scribe for Treatment Team:   Maxie Better, LCSW 11/04/2015 1:25 PM

## 2015-11-04 NOTE — BHH Group Notes (Signed)
[facility] LCSW Aftercare Discharge Planning Group Note   11/04/2015 1:15 PM  Participation Quality:  Appropriate   Mood/Affect:  Appropriate  Depression Rating:  8  Anxiety Rating:  8  Thoughts of Suicide:  No Will you contract for safety?   NA  Current AVH:  No  Plan for Discharge/Comments:  Pt reports that he still feels sick. Pt is calling low income housing and boarding houses and plans to follow-up at [facility] at d/c. Pt given [place] and go to [place]. Minimal withdrawals and continues right leg weakness reported. Poor sleep due to "constant coughing."   Transportation Means: bus?   Supports: none identified by pt.   Smart, Hao Dion LCSW

## 2015-11-04 NOTE — Progress Notes (Signed)
DAR NOTE: Patient presents with flat affect and depressed mood.  Denies auditory and visual hallucinations.  Rates depression at 8, hopelessness at 8, and anxiety at 8.  Describes energy level as low and concentration as poor.  Maintained on routine safety checks.  Medications given as prescribed.  Support and encouragement offered as needed.  Attended group and participated.  States goal for today is "feeling better."  Patient observed socializing with peers in the dayroom.  Patient continues on antibiotic therapy for upper respiratory infection.  No adverse reaction noted.  Tylenol 650 mg given for complain of headache with good effect.

## 2015-11-04 NOTE — Progress Notes (Signed)
[facility] MD Progress Note  11/04/2015 5:01 PM Samuel Reyes  MRN:  [id number] Subjective:  Samuel Reyes is still worried about where to go from here. States that he still cant sleep too well the cough is a factor. He is dealing with the pain.  Principal Problem: Major depressive disorder, recurrent episode, moderate with anxious distress ([place]) Diagnosis:   Patient Active Problem List   Diagnosis Date Noted  . Major depressive disorder, recurrent episode, moderate with anxious distress ([place]) [F33.1] 10/30/2015  . Alcohol use disorder, severe, dependence ([place]) [F10.20] 10/29/2015  . COPD exacerbation ([place]) [J44.1] 09/20/2015  . Acute respiratory failure with hypoxia ([place]) [J96.01] 09/18/2015  . Alcoholism /alcohol abuse ([place]) [F10.20]   . Suicidal ideation [R45.851] 09/17/2015  . Alcohol intoxication ([place]) [F10.129] 09/17/2015  . Depression [F32.9] 09/17/2015  . Benign essential HTN [I10] 09/17/2015  . Hypokalemia [E87.6] 09/17/2015  . Hyponatremia [E87.1] 09/17/2015  . Coffee ground emesis [K92.0] 09/17/2015  . COPD (chronic obstructive pulmonary disease) ([place]) [J44.9] 02/17/2013  . GERD (gastroesophageal reflux disease) [K21.9] 02/17/2013   Total Time spent with patient: 20 minutes  Past Psychiatric History: see admission H and P  Past Medical History:  Past Medical History  Diagnosis Date  . COPD (chronic obstructive pulmonary disease) [name])     Past Surgical History  Procedure Laterality Date  . Gastrectomy    . Shoulder surgery Bilateral     3 surgeries on on left, 2 surgeries on right   . Rt knee arthroscopic surgery    . Back surgery      3 cervical spine surgeries C4-C5 fused  . Hernia repair    . Finger surgery Left     2nd, 3rd, & 4th fingers were cut off by table saw and reattached  . Colonoscopy N/A 01/04/2014    Procedure: COLONOSCOPY;  Surgeon: Danie Binder, MD;  [facility];  Service: Endoscopy;  Laterality: N/A;  1:45  . Esophagogastroduodenoscopy N/A  01/04/2014    Procedure: ESOPHAGOGASTRODUODENOSCOPY (EGD);  Surgeon: Danie Binder, MD;  Location: [facility];  Service: Endoscopy;  Laterality: N/A;  . Incisional hernia repair N/A 01/20/2014    Procedure: LAPAROSCOPIC RECURRENT  INCISIONAL HERNIA with mesh;  Surgeon: Edward Jolly, MD;  Location: [facility];  Service: General;  Laterality: N/A;   Family History:  Family History  Problem Relation Age of Onset  . Cancer Father     bone  . Cancer Brother     lungs  . Stroke Maternal Grandmother   . Colon cancer Neg Hx   . Asthma Son     died at age 6 in his sleep   . Spina bifida Son     died at age 31   . Dementia Mother    Family Psychiatric  History: see admission H and P  Social History:  History  Alcohol Use  . 39.0 oz/week  . 65 Cans of beer per week    Comment: 4-40oz today 1/2gallon vodka a month. 5 to 6 cases a beer a month.     History  Drug Use No    Comment: denid using any drugs    Social History   Social History  . Marital Status: Widowed    Spouse Name: N/A  . Number of Children: N/A  . Years of Education: N/A   Occupational History  . Disability    Social History Main Topics  . Smoking status: Current Every Day Smoker -- 1.00 packs/day for 45 years  Types: Cigarettes    Start date: 07/30/1966  . Smokeless tobacco: Never Used  . Alcohol Use: 39.0 oz/week    65 Cans of beer per week     Comment: 4-40oz today 1/2gallon vodka a month. 5 to 6 cases a beer a month.  . Drug Use: No     Comment: denid using any drugs  . Sexual Activity: No   Other Topics Concern  . None   Social History Narrative   Additional Social History:                         Sleep: Fair  Appetite:  Fair  Current Medications: Current Facility-Administered Medications  Medication Dose Route Frequency Provider Last Rate Last Dose  . acetaminophen (TYLENOL) tablet 650 mg  650 mg Oral Q6H PRN Encarnacion Slates, NP   650 mg at 11/02/15 1605  . albuterol  (PROVENTIL HFA;VENTOLIN HFA) 108 (90 Base) MCG/ACT inhaler 1-2 puff  1-2 puff Inhalation Q4H PRN Encarnacion Slates, NP      . alum & mag hydroxide-simeth (MAALOX/MYLANTA) 200-200-20 MG/5ML suspension 30 mL  30 mL Oral Q4H PRN Encarnacion Slates, NP      . budesonide (PULMICORT) nebulizer solution 0.25 mg  0.25 mg Nebulization BID Encarnacion Slates, NP   0.25 mg at 11/04/15 0839  . feeding supplement (ENSURE ENLIVE) (ENSURE ENLIVE) liquid 237 mL  237 mL Oral BID BM Saramma Eappen, MD   237 mL at 11/04/15 1508  . gabapentin (NEURONTIN) capsule 100 mg  100 mg Oral TID Encarnacion Slates, NP   100 mg at 11/04/15 1204  . guaiFENesin (MUCINEX) 12 hr tablet 600 mg  600 mg Oral BID Nicholaus Bloom, MD   600 mg at 11/04/15 [id number]  . ibuprofen (ADVIL,MOTRIN) tablet 600 mg  600 mg Oral Q6H PRN Kerrie Buffalo, NP   600 mg at 11/03/15 2330  . Ipratropium-Albuterol (COMBIVENT) respimat 1 puff  1 puff Inhalation Q6H PRN Encarnacion Slates, NP   1 puff at 11/04/15 0839  . levofloxacin (LEVAQUIN) tablet 500 mg  500 mg Oral Daily Nicholaus Bloom, MD   500 mg at 11/04/15 0838  . lidocaine (LIDODERM) 5 % 1 patch  1 patch Transdermal Daily Ursula Alert, MD   1 patch at 11/04/15 0839  . magnesium hydroxide (MILK OF MAGNESIA) suspension 30 mL  30 mL Oral Daily PRN Encarnacion Slates, NP      . metoprolol tartrate (LOPRESSOR) tablet 25 mg  25 mg Oral QAC breakfast Encarnacion Slates, NP   25 mg at 11/04/15 0620  . mirtazapine (REMERON) tablet 15 mg  15 mg Oral QHS Ursula Alert, MD   15 mg at 11/03/15 2132  . multivitamin with minerals tablet 1 tablet  1 tablet Oral Daily Encarnacion Slates, NP   1 tablet at 11/04/15 [phone]  . nicotine (NICODERM CQ - dosed in mg/24 hours) patch 21 mg  21 mg Transdermal Daily Nicholaus Bloom, MD   21 mg at 11/04/15 0840  . pantoprazole (PROTONIX) EC tablet 40 mg  40 mg Oral Daily Encarnacion Slates, NP   40 mg at 11/04/15 0838  . thiamine (VITAMIN B-1) tablet 100 mg  100 mg Oral Daily Encarnacion Slates, NP   100 mg at 11/04/15 [id number]  .  traZODone (DESYREL) tablet 100 mg  100 mg Oral QHS PRN Encarnacion Slates, NP   100 mg at 11/03/15 2132  Lab Results: No results found for this or any previous visit (from the past 48 hour(s)).  Blood Alcohol level:  Lab Results  Component Value Date   ETH 209* 10/28/2015   ETH 287* 09/17/2015    Physical Findings: AIMS: Facial and Oral Movements Muscles of Facial Expression: None, normal Lips and Perioral Area: None, normal Jaw: None, normal Tongue: None, normal,Extremity Movements Upper (arms, wrists, hands, fingers): None, normal Lower (legs, knees, ankles, toes): None, normal, Trunk Movements Neck, shoulders, hips: None, normal, Overall Severity Severity of abnormal movements (highest score from questions above): None, normal Incapacitation due to abnormal movements: None, normal Patient's awareness of abnormal movements (rate only patient's report): No Awareness, Dental Status Current problems with teeth and/or dentures?: No Does patient usually wear dentures?: No  CIWA:  CIWA-Ar Total: 1 COWS:  COWS Total Score: 3  Musculoskeletal: Strength & Muscle Tone: within normal limits Gait & Station: uses walker to help himself  Patient leans: normal  Psychiatric Specialty Exam: Review of Systems  Constitutional: Positive for malaise/fatigue.  HENT: Negative.   Eyes: Negative.   Respiratory: Negative.   Cardiovascular: Negative.   Gastrointestinal: Negative.   Genitourinary: Negative.   Musculoskeletal: Positive for myalgias, back pain and joint pain.  Skin: Negative.   Neurological: Positive for weakness.  Endo/Heme/Allergies: Negative.   Psychiatric/Behavioral: Positive for depression and substance abuse. The patient is nervous/anxious.     Blood pressure 139/78, pulse 130, temperature 98.2 F (36.8 C), temperature source Oral, resp. rate 24, height 5\' 5"  (1.651 m), weight 70.308 kg (155 lb), SpO2 95 %.Body mass index is 25.79 kg/(m^2).  General Appearance: Fairly  Groomed  Engineer, water::  Fair  Speech:  Clear and Coherent  Volume:  decreased  Mood:  Anxious and Depressed  Affect:  anxious worried  Thought Process:  Coherent and Goal Directed  Orientation:  Full (Time, Place, and Person)  Thought Content:  symptoms events worries concerns  Suicidal Thoughts:  No  Homicidal Thoughts:  No  Memory:  Immediate;   Fair Recent;   Fair Remote;   Fair  Judgement:  Fair  Insight:  Present and Shallow  Psychomotor Activity:  Restlessness  Concentration:  Fair  Recall:  [employer] of Knowledge:Fair  Language: Fair  Akathisia:  No  Handed:  Right  AIMS (if indicated):     Assets:  Desire for Improvement  ADL's:  Intact  Cognition: WNL  Sleep:  Number of Hours: 6   Treatment Plan Summary: Daily contact with patient to assess and evaluate symptoms and progress in treatment and Medication management Supportive approach/coping skills Alcohol dependence; work a relapse prevention plan Insomnia; Remeron 15/Trazodone 100 Cough-COPD; will continue the antibiotic/pulmicort/musinex Work to identify placement options Shirelle Tootle A, MD 11/04/2015, 5:01 PM

## 2015-11-05 NOTE — Progress Notes (Signed)
[place] Group Notes:  (Nursing/MHT/Case Management/Adjunct)  Date:  11/05/2015  Time:  2100   Type of Therapy:  wrap up group  Participation Level:  Active  Participation Quality:  Appropriate, Attentive, Sharing and Supportive  Affect:  Depressed  Cognitive:  Appropriate  Insight:  Appropriate  Engagement in Group:  Engaged  Modes of Intervention:  Clarification, Education and Support   Summary of Progress/Problems: Pt has suffered a lot of loss and turns to drinking when the sadness overcomes him. Pt reports having attended AA regular for 8 months and plans on a swift return upon discharge. Pt reports AA being successful for him. Pt also shares that he would like to make his way back to [place] where he lived a majority of his life. Pt made contact with his sister today to check on his mother.   Shellia Cleverly 11/05/2015, 10:13 PM

## 2015-11-05 NOTE — Progress Notes (Signed)
[facility] MD Progress Note  11/05/2015 8:14 PM Samuel Reyes  MRN:  [id number] Subjective:  Samuel Reyes is planning to go back to the boarding house where the owner has offered to give him a room downstairs. He states that his cough is better. He slept better last night. He is having a hard time with his knee. States that they are still not wanting to look at it because he is early 60  Principal Problem: Major depressive disorder, recurrent episode, moderate with anxious distress ([place]) Diagnosis:   Patient Active Problem List   Diagnosis Date Noted  . Major depressive disorder, recurrent episode, moderate with anxious distress ([place]) [F33.1] 10/30/2015  . Alcohol use disorder, severe, dependence ([place]) [F10.20] 10/29/2015  . COPD exacerbation ([place]) [J44.1] 09/20/2015  . Acute respiratory failure with hypoxia ([place]) [J96.01] 09/18/2015  . Alcoholism /alcohol abuse ([place]) [F10.20]   . Suicidal ideation [R45.851] 09/17/2015  . Alcohol intoxication ([place]) [F10.129] 09/17/2015  . Depression [F32.9] 09/17/2015  . Benign essential HTN [I10] 09/17/2015  . Hypokalemia [E87.6] 09/17/2015  . Hyponatremia [E87.1] 09/17/2015  . Coffee ground emesis [K92.0] 09/17/2015  . COPD (chronic obstructive pulmonary disease) ([place]) [J44.9] 02/17/2013  . GERD (gastroesophageal reflux disease) [K21.9] 02/17/2013   Total Time spent with patient: 20 minutes  Past Psychiatric History: see admission H and P  Past Medical History:  Past Medical History  Diagnosis Date  . COPD (chronic obstructive pulmonary disease) [facility])     Past Surgical History  Procedure Laterality Date  . Gastrectomy    . Shoulder surgery Bilateral     3 surgeries on on left, 2 surgeries on right   . Rt knee arthroscopic surgery    . Back surgery      3 cervical spine surgeries C4-C5 fused  . Hernia repair    . Finger surgery Left     2nd, 3rd, & 4th fingers were cut off by table saw and reattached  . Colonoscopy N/A 01/04/2014    Procedure:  COLONOSCOPY;  Surgeon: Danie Binder, MD;  [facility];  Service: Endoscopy;  Laterality: N/A;  1:45  . Esophagogastroduodenoscopy N/A 01/04/2014    Procedure: ESOPHAGOGASTRODUODENOSCOPY (EGD);  Surgeon: Danie Binder, MD;  [facility];  Service: Endoscopy;  Laterality: N/A;  . Incisional hernia repair N/A 01/20/2014    Procedure: LAPAROSCOPIC RECURRENT  INCISIONAL HERNIA with mesh;  Surgeon: Edward Jolly, MD;  Location: [facility];  Service: General;  Laterality: N/A;   Family History:  Family History  Problem Relation Age of Onset  . Cancer Father     bone  . Cancer Brother     lungs  . Stroke Maternal Grandmother   . Colon cancer Neg Hx   . Asthma Son     died at age 65 in his sleep   . Spina bifida Son     died at age 19   . Dementia Mother    Family Psychiatric  History: see admission H and P  Social History:  History  Alcohol Use  . 39.0 oz/week  . 65 Cans of beer per week    Comment: 4-40oz today 1/2gallon vodka a month. 5 to 6 cases a beer a month.     History  Drug Use No    Comment: denid using any drugs    Social History   Social History  . Marital Status: Widowed    Spouse Name: N/A  . Number of Children: N/A  . Years of Education: N/A  Occupational History  . Disability    Social History Main Topics  . Smoking status: Current Every Day Smoker -- 1.00 packs/day for 45 years    Types: Cigarettes    Start date: 07/30/1966  . Smokeless tobacco: Never Used  . Alcohol Use: 39.0 oz/week    65 Cans of beer per week     Comment: 4-40oz today 1/2gallon vodka a month. 5 to 6 cases a beer a month.  . Drug Use: No     Comment: denid using any drugs  . Sexual Activity: No   Other Topics Concern  . None   Social History Narrative   Additional Social History:                         Sleep: Fair  Appetite:  Fair  Current Medications: Current Facility-Administered Medications  Medication Dose Route Frequency Provider  Last Rate Last Dose  . acetaminophen (TYLENOL) tablet 650 mg  650 mg Oral Q6H PRN Encarnacion Slates, NP   650 mg at 11/05/15 0813  . albuterol (PROVENTIL HFA;VENTOLIN HFA) 108 (90 Base) MCG/ACT inhaler 1-2 puff  1-2 puff Inhalation Q4H PRN Encarnacion Slates, NP      . alum & mag hydroxide-simeth (MAALOX/MYLANTA) 200-200-20 MG/5ML suspension 30 mL  30 mL Oral Q4H PRN Encarnacion Slates, NP      . budesonide (PULMICORT) nebulizer solution 0.25 mg  0.25 mg Nebulization BID Encarnacion Slates, NP   0.25 mg at 11/05/15 1656  . feeding supplement (ENSURE ENLIVE) (ENSURE ENLIVE) liquid 237 mL  237 mL Oral BID BM Saramma Eappen, MD   237 mL at 11/05/15 1117  . gabapentin (NEURONTIN) capsule 100 mg  100 mg Oral TID Encarnacion Slates, NP   100 mg at 11/05/15 1656  . guaiFENesin (MUCINEX) 12 hr tablet 600 mg  600 mg Oral BID Nicholaus Bloom, MD   600 mg at 11/05/15 1656  . ibuprofen (ADVIL,MOTRIN) tablet 600 mg  600 mg Oral Q6H PRN Kerrie Buffalo, NP   600 mg at 11/03/15 2330  . Ipratropium-Albuterol (COMBIVENT) respimat 1 puff  1 puff Inhalation Q6H PRN Encarnacion Slates, NP   1 puff at 11/05/15 [phone]  . levofloxacin (LEVAQUIN) tablet 500 mg  500 mg Oral Daily Nicholaus Bloom, MD   500 mg at 11/05/15 [phone]  . lidocaine (LIDODERM) 5 % 1 patch  1 patch Transdermal Daily Ursula Alert, MD   1 patch at 11/05/15 0811  . magnesium hydroxide (MILK OF MAGNESIA) suspension 30 mL  30 mL Oral Daily PRN Encarnacion Slates, NP      . metoprolol tartrate (LOPRESSOR) tablet 25 mg  25 mg Oral QAC breakfast Encarnacion Slates, NP   25 mg at 11/05/15 0636  . mirtazapine (REMERON) tablet 15 mg  15 mg Oral QHS Ursula Alert, MD   15 mg at 11/04/15 2151  . multivitamin with minerals tablet 1 tablet  1 tablet Oral Daily Encarnacion Slates, NP   1 tablet at 11/05/15 [phone]  . nicotine (NICODERM CQ - dosed in mg/24 hours) patch 21 mg  21 mg Transdermal Daily Nicholaus Bloom, MD   21 mg at 11/05/15 0813  . pantoprazole (PROTONIX) EC tablet 40 mg  40 mg Oral Daily Encarnacion Slates, NP    40 mg at 11/05/15 0811  . thiamine (VITAMIN B-1) tablet 100 mg  100 mg Oral Daily Encarnacion Slates, NP   100 mg  at 11/05/15 0810  . traZODone (DESYREL) tablet 100 mg  100 mg Oral QHS PRN Encarnacion Slates, NP   100 mg at 11/04/15 2151    Lab Results: No results found for this or any previous visit (from the past 48 hour(s)).  Blood Alcohol level:  Lab Results  Component Value Date   ETH 209* 10/28/2015   ETH 287* 09/17/2015    Physical Findings: AIMS: Facial and Oral Movements Muscles of Facial Expression: None, normal Lips and Perioral Area: None, normal Jaw: None, normal Tongue: None, normal,Extremity Movements Upper (arms, wrists, hands, fingers): None, normal Lower (legs, knees, ankles, toes): None, normal, Trunk Movements Neck, shoulders, hips: None, normal, Overall Severity Severity of abnormal movements (highest score from questions above): None, normal Incapacitation due to abnormal movements: None, normal Patient's awareness of abnormal movements (rate only patient's report): No Awareness, Dental Status Current problems with teeth and/or dentures?: No Does patient usually wear dentures?: No  CIWA:  CIWA-Ar Total: 1 COWS:  COWS Total Score: 3  Musculoskeletal: Strength & Muscle Tone: within normal limits Gait & Station: uses walker to help himself  Patient leans: normal  Psychiatric Specialty Exam: Review of Systems  Constitutional: Positive for malaise/fatigue.  HENT: Negative.   Eyes: Negative.   Respiratory: Negative.   Cardiovascular: Negative.   Gastrointestinal: Negative.   Genitourinary: Negative.   Musculoskeletal: Positive for myalgias, back pain and joint pain.  Skin: Negative.   Neurological: Positive for weakness.  Endo/Heme/Allergies: Negative.   Psychiatric/Behavioral: Positive for depression and substance abuse. The patient is nervous/anxious.     Blood pressure 130/83, pulse 130, temperature 97.7 F (36.5 C), temperature source Oral, resp. rate  21, height 5\' 5"  (1.651 m), weight 70.308 kg (155 lb), SpO2 95 %.Body mass index is 25.79 kg/(m^2).  General Appearance: Fairly Groomed  Engineer, water::  Fair  Speech:  Clear and Coherent  Volume:  decreased  Mood:  Anxious and Depressed  Affect:  anxious worried  Thought Process:  Coherent and Goal Directed  Orientation:  Full (Time, Place, and Person)  Thought Content:  symptoms events worries concerns  Suicidal Thoughts:  No  Homicidal Thoughts:  No  Memory:  Immediate;   Fair Recent;   Fair Remote;   Fair  Judgement:  Fair  Insight:  Present and Shallow  Psychomotor Activity:  Restlessness  Concentration:  Fair  Recall:  [employer] of Knowledge:Fair  Language: Fair  Akathisia:  No  Handed:  Right  AIMS (if indicated):     Assets:  Desire for Improvement  ADL's:  Intact  Cognition: WNL  Sleep:  Number of Hours: 6.75   Treatment Plan Summary: Daily contact with patient to assess and evaluate symptoms and progress in treatment and Medication management Supportive approach/coping skills Alcohol dependence; work a relapse prevention plan Insomnia; continue Remeron 15/Trazodone 100 Cough-COPD; will continue the antibiotic/pulmicort/musinex Ermagene Saidi A, MD 11/05/2015, 8:14 PM

## 2015-11-05 NOTE — BHH Group Notes (Signed)
[place] Group Notes:  (Clinical Social Work)   05/28/2015     10:00-11:00AM  Summary of Progress/Problems:   In today's process group a decisional balance exercise was used to explore in depth the perceived benefits and costs of alcohol and drugs, as well as the  benefits and costs of replacing these with healthy coping skills.  Patients listed healthy and unhealthy coping techniques, determining with CSW guidance that unhealthy coping techniques work initially, but eventually become harmful.  Motivational Interviewing and the whiteboard were utilized for the exercises.  The patient expressed that the unhealthy coping he often uses is drinking beer and poor money management of his SSI check so he does not have money for the necessities.  He participated fully and eagerly in the discussion.  Type of Therapy:  Group Therapy - Process   Participation Level:  Active  Participation Quality:  Attentive, Sharing and Supportive  Affect:  Appropriate  Cognitive:  Appropriate and Oriented  Insight:  Engaged  Engagement in Therapy:  Engaged  Modes of Intervention:  Education, Motivational Interviewing  Selmer Dominion, LCSW 11/05/2015, 12:32 PM

## 2015-11-05 NOTE — Progress Notes (Signed)
D:Patient in the dayroom on approach.  Patient states he had a better day today.  Patient did get dressed and has been visible on the unit.  Patient states he did not sleep during the day today.  Patient denies SI/HI and denies AVH. A: Staff to monitor Q 15 mins for safety.  Encouragement and support offered.  Scheduled medications administered per orders.  Trazodone administered prn for sleep. R: Patient remains safe on the unit.  Patient attended group tonight.  Patient visible on the unit and interacting with peers.

## 2015-11-05 NOTE — Progress Notes (Signed)
DAR NOTE: Patient presents with flat affect and depressed mood.  Denies auditory and visual hallucinations.  Rates depression at 9, hopelessness at 9, and anxiety at 9.  Maintained on routine safety checks.  Medications given as prescribed.  Support and encouragement offered as needed.  Attended group and participated.  Patient observed socializing with peers in the dayroom.  Antibiotic therapy continues for upper respiratory infection.  No adverse reaction noted.   No acute respiratory distress noted or reported.

## 2015-11-05 NOTE — BHH Group Notes (Signed)
[facility] Group Notes:  (Nursing/MHT/Case Management/Adjunct)  Date:  11/05/2015  Time:  2:40 PM  Type of Therapy:  Nurse Education  Participation Level:  Active  Participation Quality:  Appropriate and Attentive  Affect:  Appropriate  Cognitive:  Alert and Appropriate  Insight:  Appropriate, Good and Improving  Engagement in Group:  Engaged  Modes of Intervention:  Activity, Confrontation, Discussion and Education  Summary of Progress/Problems: Topic was on healthy coping skills.  Discussed the importance of learning new healthy coping skills before discharging from the hospital.  Group encouraged to continue to utilize these skills even after discharge from the hospital.  Patient was attentive and receptive.  Mart Piggs 11/05/2015, 2:40 PM

## 2015-11-05 NOTE — Progress Notes (Signed)
D.  Pt pleasant on approach, denies complaints at this time.  Positive for evening wrap up group, interacting appropriately with peers on the unit.  Denies SI/HI/hallucinations at this time.  A.  Support and encouragement offered, medication given as ordered.  R.  Pt remains safe on the unit, will continue to monitor.

## 2015-11-06 NOTE — BHH Group Notes (Signed)
[facility] Group Notes:  (Clinical Social Work)  11/06/2015  10:00-11:00AM  Summary of Progress/Problems:   The main focus of today's process group was to   1)  discuss the importance of adding supports  2)  define health supports versus unhealthy supports  3)  identify the patient's current unhealthy supports and plan how to handle them  4)  Identify the patient's current healthy supports and plan what to add.  An emphasis was placed on using counselor, doctor, therapy groups, 12-step groups, and problem-specific support groups to expand supports.    The patient expressed full comprehension of the concepts presented, and agreed that there is a need to add more supports.  The patient stated he is going to try to involve some of his family members in his life again.  He talked about being both excited and nervous about leaving the hospital tomorrow.  He had agreed when the group talked about getting a sponsor, but did not name that as an option he thinks he will pursue.  Type of Therapy:  Process Group with Motivational Interviewing  Participation Level:  Active  Participation Quality:  Attentive and Sharing  Affect:  Appropriate  Cognitive:  Appropriate and Oriented  Insight:  Engaged  Engagement in Therapy:  Engaged  Modes of Intervention:   Education, Support and Processing, Activity  Selmer Dominion, LCSW 11/06/2015

## 2015-11-06 NOTE — [facility] Group Notes (Signed)
[facility] Group Notes:  (Nursing/MHT/Case Management/Adjunct)  Date:  11/06/2015  Time:  1415pm  Type of Therapy:  Group completed by nursing students  Participation Level:  Active  Participation Quality:  Appropriate and Attentive  Affect:  Appropriate  Cognitive:  Alert and Appropriate  Insight:  Appropriate and Good  Engagement in Group:  Engaged  Modes of Intervention:  Activity  Summary of Progress/Problems: Patient attended group and participated.  Linward Headland 11/06/2015, 4:40 PM

## 2015-11-06 NOTE — Progress Notes (Signed)
Patient ID: Samuel Reyes, male   DOB: [birth year], 60 y.o.   MRN: [id number] [facility] MD Progress Note  11/06/2015 8:39 PM Samuel Reyes  MRN:  [id number] Subjective:  Samuel Reyes says he is ready  to go back to the boarding house where the owner has offered to give him a room downstairs. He states that his cough is gone. He slept better last night. He is having a hard time with his knee. States that they are still not wanting to look at it because he is early 88 but he will like to have a reassessment . States he is committed to not drink  Principal Problem: Major depressive disorder, recurrent episode, moderate with anxious distress ([name]) Diagnosis:   Patient Active Problem List   Diagnosis Date Noted  . Major depressive disorder, recurrent episode, moderate with anxious distress ([name]) [F33.1] 10/30/2015  . Alcohol use disorder, severe, dependence ([place]) [F10.20] 10/29/2015  . COPD exacerbation ([place]) [J44.1] 09/20/2015  . Acute respiratory failure with hypoxia ([place]) [J96.01] 09/18/2015  . Alcoholism /alcohol abuse ([place]) [F10.20]   . Suicidal ideation [R45.851] 09/17/2015  . Alcohol intoxication ([place]) [F10.129] 09/17/2015  . Depression [F32.9] 09/17/2015  . Benign essential HTN [I10] 09/17/2015  . Hypokalemia [E87.6] 09/17/2015  . Hyponatremia [E87.1] 09/17/2015  . Coffee ground emesis [K92.0] 09/17/2015  . COPD (chronic obstructive pulmonary disease) ([place]) [J44.9] 02/17/2013  . GERD (gastroesophageal reflux disease) [K21.9] 02/17/2013   Total Time spent with patient: 15  Past Psychiatric History: see admission H and P  Past Medical History:  Past Medical History  Diagnosis Date  . COPD (chronic obstructive pulmonary disease) [facility])     Past Surgical History  Procedure Laterality Date  . Gastrectomy    . Shoulder surgery Bilateral     3 surgeries on on left, 2 surgeries on right   . Rt knee arthroscopic surgery    . Back surgery      3 cervical spine surgeries C4-C5 fused  . Hernia  repair    . Finger surgery Left     2nd, 3rd, & 4th fingers were cut off by table saw and reattached  . Colonoscopy N/A 01/04/2014    Procedure: COLONOSCOPY;  Surgeon: Danie Binder, MD;  Location: [facility];  Service: Endoscopy;  Laterality: N/A;  1:45  . Esophagogastroduodenoscopy N/A 01/04/2014    Procedure: ESOPHAGOGASTRODUODENOSCOPY (EGD);  Surgeon: Danie Binder, MD;  Location: [facility] END[facility];  Service: Endoscopy;  Laterality: N/A;  . Incisional hernia repair N/A 01/20/2014    Procedure: LAPAROSCOPIC RECURRENT  INCISIONAL HERNIA with mesh;  Surgeon: Edward Jolly, MD;  Location: [facility] ORS;  Service: General;  Laterality: N/A;   Family History:  Family History  Problem Relation Age of Onset  . Cancer Father     bone  . Cancer Brother     lungs  . Stroke Maternal Grandmother   . Colon cancer Neg Hx   . Asthma Son     died at age 62 in his sleep   . Spina bifida Son     died at age 22   . Dementia Mother    Family Psychiatric  History: see admission H and P  Social History:  History  Alcohol Use  . 39.0 oz/week  . 65 Cans of beer per week    Comment: 4-40oz today 1/2gallon vodka a month. 5 to 6 cases a beer a month.     History  Drug Use No    Comment: denid using any  drugs    Social History   Social History  . Marital Status: Widowed    Spouse Name: N/A  . Number of Children: N/A  . Years of Education: N/A   Occupational History  . Disability    Social History Main Topics  . Smoking status: Current Every Day Smoker -- 1.00 packs/day for 45 years    Types: Cigarettes    Start date: 07/30/1966  . Smokeless tobacco: Never Used  . Alcohol Use: 39.0 oz/week    65 Cans of beer per week     Comment: 4-40oz today 1/2gallon vodka a month. 5 to 6 cases a beer a month.  . Drug Use: No     Comment: denid using any drugs  . Sexual Activity: No   Other Topics Concern  . None   Social History Narrative   Additional Social History:                          Sleep: Fair  Appetite:  Fair  Current Medications: Current Facility-Administered Medications  Medication Dose Route Frequency Provider Last Rate Last Dose  . acetaminophen (TYLENOL) tablet 650 mg  650 mg Oral Q6H PRN Encarnacion Slates, NP   650 mg at 11/06/15 1830  . albuterol (PROVENTIL HFA;VENTOLIN HFA) 108 (90 Base) MCG/ACT inhaler 1-2 puff  1-2 puff Inhalation Q4H PRN Encarnacion Slates, NP      . alum & mag hydroxide-simeth (MAALOX/MYLANTA) 200-200-20 MG/5ML suspension 30 mL  30 mL Oral Q4H PRN Encarnacion Slates, NP      . budesonide (PULMICORT) nebulizer solution 0.25 mg  0.25 mg Nebulization BID Encarnacion Slates, NP   0.25 mg at 11/06/15 1827  . feeding supplement (ENSURE ENLIVE) (ENSURE ENLIVE) liquid 237 mL  237 mL Oral BID BM Saramma Eappen, MD   237 mL at 11/05/15 1117  . gabapentin (NEURONTIN) capsule 100 mg  100 mg Oral TID Encarnacion Slates, NP   100 mg at 11/06/15 1830  . guaiFENesin (MUCINEX) 12 hr tablet 600 mg  600 mg Oral BID Nicholaus Bloom, MD   600 mg at 11/06/15 1829  . ibuprofen (ADVIL,MOTRIN) tablet 600 mg  600 mg Oral Q6H PRN Kerrie Buffalo, NP   600 mg at 11/03/15 2330  . Ipratropium-Albuterol (COMBIVENT) respimat 1 puff  1 puff Inhalation Q6H PRN Encarnacion Slates, NP   1 puff at 11/05/15 [phone]  . levofloxacin (LEVAQUIN) tablet 500 mg  500 mg Oral Daily Nicholaus Bloom, MD   500 mg at 11/06/15 0743  . lidocaine (LIDODERM) 5 % 1 patch  1 patch Transdermal Daily Ursula Alert, MD   1 patch at 11/06/15 0742  . magnesium hydroxide (MILK OF MAGNESIA) suspension 30 mL  30 mL Oral Daily PRN Encarnacion Slates, NP      . metoprolol tartrate (LOPRESSOR) tablet 25 mg  25 mg Oral QAC breakfast Encarnacion Slates, NP   25 mg at 11/06/15 0609  . mirtazapine (REMERON) tablet 15 mg  15 mg Oral QHS Ursula Alert, MD   15 mg at 11/05/15 2136  . multivitamin with minerals tablet 1 tablet  1 tablet Oral Daily Encarnacion Slates, NP   1 tablet at 11/06/15 0743  . nicotine (NICODERM CQ - dosed in mg/24 hours) patch 21 mg   21 mg Transdermal Daily Nicholaus Bloom, MD   21 mg at 11/06/15 0742  . pantoprazole (PROTONIX) EC tablet 40 mg  40  mg Oral Daily Encarnacion Slates, NP   40 mg at 11/06/15 0743  . thiamine (VITAMIN B-1) tablet 100 mg  100 mg Oral Daily Encarnacion Slates, NP   100 mg at 11/06/15 0744  . traZODone (DESYREL) tablet 100 mg  100 mg Oral QHS PRN Encarnacion Slates, NP   100 mg at 11/05/15 2136    Lab Results: No results found for this or any previous visit (from the past 48 hour(s)).  Blood Alcohol level:  Lab Results  Component Value Date   ETH 209* 10/28/2015   ETH 287* 09/17/2015    Physical Findings: AIMS: Facial and Oral Movements Muscles of Facial Expression: None, normal Lips and Perioral Area: None, normal Jaw: None, normal Tongue: None, normal,Extremity Movements Upper (arms, wrists, hands, fingers): None, normal Lower (legs, knees, ankles, toes): None, normal, Trunk Movements Neck, shoulders, hips: None, normal, Overall Severity Severity of abnormal movements (highest score from questions above): None, normal Incapacitation due to abnormal movements: None, normal Patient's awareness of abnormal movements (rate only patient's report): No Awareness, Dental Status Current problems with teeth and/or dentures?: No Does patient usually wear dentures?: No  CIWA:  CIWA-Ar Total: 0 COWS:  COWS Total Score: 3  Musculoskeletal: Strength & Muscle Tone: within normal limits Gait & Station: uses walker to help himself  Patient leans: normal  Psychiatric Specialty Exam: Review of Systems  Constitutional: Positive for malaise/fatigue.  HENT: Negative.   Eyes: Negative.   Respiratory: Negative.   Cardiovascular: Negative.   Gastrointestinal: Negative.   Genitourinary: Negative.   Musculoskeletal: Positive for myalgias, back pain and joint pain.  Skin: Negative.   Neurological: Positive for weakness.  Endo/Heme/Allergies: Negative.   Psychiatric/Behavioral: Positive for depression and substance  abuse. The patient is nervous/anxious.     Blood pressure 136/90, pulse 127, temperature 97.8 F (36.6 C), temperature source Oral, resp. rate 24, height 5\' 5"  (1.651 m), weight 70.308 kg (155 lb), SpO2 95 %.Body mass index is 25.79 kg/(m^2).  General Appearance: Fairly Groomed  Engineer, water::  Fair  Speech:  Clear and Coherent  Volume:  decreased  Mood:  Anxious and Depressed  Affect:  anxious worried  Thought Process:  Coherent and Goal Directed  Orientation:  Full (Time, Place, and Person)  Thought Content:  symptoms events worries concerns  Suicidal Thoughts:  No  Homicidal Thoughts:  No  Memory:  Immediate;   Fair Recent;   Fair Remote;   Fair  Judgement:  Fair  Insight:  Present and Shallow  Psychomotor Activity:  Restlessness  Concentration:  Fair  Recall:  [employer] of Knowledge:Fair  Language: Fair  Akathisia:  No  Handed:  Right  AIMS (if indicated):     Assets:  Desire for Improvement  ADL's:  Intact  Cognition: WNL  Sleep:  Number of Hours: 6.25   Treatment Plan Summary: Daily contact with patient to assess and evaluate symptoms and progress in treatment and Medication management Supportive approach/coping skills Alcohol dependence; work a relapse prevention plan Insomnia; continue Remeron 15/Trazodone 100 Cough-COPD; will continue the antibiotic/pulmicort/musinex  Will continue to work with CBT/mindfulness  Yuri Fana A, MD 11/06/2015, 8:39 PM

## 2015-11-06 NOTE — [facility] Group Notes (Signed)
[place] Group Notes:  (Nursing/MHT/Case Management/Adjunct)  Date:  11/06/2015  Time:  1:52 PM  Type of Therapy:  Group Therapy  Participation Level:  Active  Participation Quality:  Appropriate  Affect:  Appropriate  Cognitive:  Appropriate  Insight:  Appropriate  Engagement in Group:  Engaged  Modes of Intervention:  Discussion  Summary of Progress/Problems:  Clarita Crane 11/06/2015, 1:52 PM

## 2015-11-07 MED ORDER — LIDOCAINE 5 % EX PTCH: 1.0000 | MEDICATED_PATCH | Freq: Every day | CUTANEOUS | Status: DC

## 2015-11-07 MED ORDER — LEVOFLOXACIN 500 MG PO TABS: 500.0000 mg | ORAL_TABLET | Freq: Every day | ORAL | Status: DC

## 2015-11-07 MED ORDER — PANTOPRAZOLE SODIUM 40 MG PO TBEC: 40.0000 mg | DELAYED_RELEASE_TABLET | Freq: Every day | ORAL | Status: DC

## 2015-11-07 MED ORDER — MIRTAZAPINE 15 MG PO TABS: 15.0000 mg | ORAL_TABLET | Freq: Every day | ORAL | Status: DC

## 2015-11-07 MED ORDER — METOPROLOL TARTRATE 25 MG PO TABS: 25.0000 mg | ORAL_TABLET | Freq: Every day | ORAL | Status: DC

## 2015-11-07 MED ORDER — GABAPENTIN 100 MG PO CAPS: 100.0000 mg | ORAL_CAPSULE | Freq: Three times a day (TID) | ORAL | Status: DC

## 2015-11-07 MED ORDER — NICOTINE 21 MG/24HR TD PT24: 21.0000 mg | MEDICATED_PATCH | Freq: Every day | TRANSDERMAL | Status: DC

## 2015-11-07 MED ORDER — TRAZODONE HCL 100 MG PO TABS: 100.0000 mg | ORAL_TABLET | Freq: Every evening | ORAL | Status: DC | PRN

## 2015-11-07 MED ORDER — IPRATROPIUM-ALBUTEROL 20-100 MCG/ACT IN AERS: 1.0000 | INHALATION_SPRAY | Freq: Four times a day (QID) | RESPIRATORY_TRACT | Status: DC | PRN

## 2015-11-07 MED ORDER — ALBUTEROL SULFATE HFA 108 (90 BASE) MCG/ACT IN AERS: 1.0000 | INHALATION_SPRAY | RESPIRATORY_TRACT | Status: DC | PRN

## 2015-11-07 MED ORDER — BUDESONIDE 0.25 MG/2ML IN SUSP: 0.2500 mg | Freq: Two times a day (BID) | RESPIRATORY_TRACT | Status: DC

## 2015-11-07 NOTE — Progress Notes (Signed)
Eriverto was D/C from the unit to lobby with bus tickets.  He was pleasant and cooperative. He voiced no SI/HI or A/V halluciations.  D/C instructions and medications reviewed with pt.  Pt. verbalized understanding of medications and d/c instructions.   All belongings from locker 41 (black hat, black belt, NCID, black shoes, Blue hoodie, black phone, black wallet-8 pennies, EBT card, SS card Medicaid card, [employer] debit and key) returned to pt. Q 15 min checks maintained until discharge.  Ihsan left the unit in no apparent distress.

## 2015-11-07 NOTE — Progress Notes (Signed)
Recreation Therapy Notes  Date: 04.10.2017 Time: 9:30am Location: [address] Group Room   Group Topic: Stress Management  Goal Area(s) Addresses:  Patient will actively participate in stress management techniques presented during session.   Behavioral Response: Appropriate, Engaged  Intervention: Stress management techniques  Activity :  Diaphragmatic Breathing, Mindful Breathing and Mindfulness. LRT provided education, instruction and demonstration on practice of Diaphragmatic Breathing, Mindful Breathing and Mindfulness. Patient was asked to participate in technique introduced during session.   Education:  Stress Management, Discharge Planning.   Education Outcome: Acknowledges education  Clinical Observations/Feedback: Patient actively engaged in technique introduced, expressed no concerns and demonstrated ability to practice independently post d/c.   Laureen Ochs Fernande Treiber, LRT/CTRS        Chyna Kneece L 11/07/2015 1:42 PM

## 2015-11-07 NOTE — Progress Notes (Signed)
Pt observed in the dayroom watching TV with minimal interacting with peers.  He reports he has had a good day.   Writer ask pt about his pain level, and he reports that his pain level is 8/10.  When asked about taking his lidocaine patch off this evening, he stated that it was not put on this morning.  At bedtime, he was given Ibuprofen for pain along with his other medications.  He denies any withdrawal symptoms at this time.  He reports that he is being discharged tomorrow to go back to the boarding house where he was staying prior to admission.  He denies SI/HI/AVH.  He says that he is glad the owner of the home is going to let him have a room that is easier to get to since he has chronic knee pain.  He intends to stay away from alcohol, and reconnect with some of his family.  Pt makes his needs known to staff  Support and encouragement offered.  Discharge plans in process.  Safety maintained with q15 minute checks.

## 2015-11-07 NOTE — BHH Suicide Risk Assessment (Signed)
[facility] Discharge Suicide Risk Assessment   Principal Problem: Major depressive disorder, recurrent episode, moderate with anxious distress [facility]) Discharge Diagnoses:  Patient Active Problem List   Diagnosis Date Noted  . Major depressive disorder, recurrent episode, moderate with anxious distress ([place]) [F33.1] 10/30/2015  . Alcohol use disorder, severe, dependence ([place]) [F10.20] 10/29/2015  . COPD exacerbation ([place]) [J44.1] 09/20/2015  . Acute respiratory failure with hypoxia ([place]) [J96.01] 09/18/2015  . Alcoholism /alcohol abuse ([place]) [F10.20]   . Suicidal ideation [R45.851] 09/17/2015  . Alcohol intoxication ([place]) [F10.129] 09/17/2015  . Depression [F32.9] 09/17/2015  . Benign essential HTN [I10] 09/17/2015  . Hypokalemia [E87.6] 09/17/2015  . Hyponatremia [E87.1] 09/17/2015  . Coffee ground emesis [K92.0] 09/17/2015  . COPD (chronic obstructive pulmonary disease) ([place]) [J44.9] 02/17/2013  . GERD (gastroesophageal reflux disease) [K21.9] 02/17/2013    Total Time spent with patient: 20 minutes  Musculoskeletal: Strength & Muscle Tone: within normal limits Gait & Station: affected by knee pain Patient leans: normal  Psychiatric Specialty Exam: Review of Systems  Constitutional: Negative.   HENT: Negative.   Eyes: Negative.   Respiratory: Negative.   Cardiovascular: Negative.   Gastrointestinal: Negative.   Genitourinary: Negative.   Musculoskeletal: Positive for joint pain.  Skin: Negative.   Neurological: Negative.   Endo/Heme/Allergies: Negative.   Psychiatric/Behavioral: Positive for substance abuse. The patient is nervous/anxious.     Blood pressure 121/75, pulse 88, temperature 98 F (36.7 C), temperature source Oral, resp. rate 16, height 5\' 5"  (1.651 m), weight 70.308 kg (155 lb), SpO2 95 %.Body mass index is 25.79 kg/(m^2).  General Appearance: Fairly Groomed  Engineer, water::  Fair  Speech:  Clear and [id number]  Volume:  Decreased  Mood:  Anxious  Affect:   Appropriate  Thought Process:  Coherent and Goal Directed  Orientation:  Full (Time, Place, and Person)  Thought Content:  plans as he moves on, relapse prevention  Suicidal Thoughts:  No  Homicidal Thoughts:  No  Memory:  Immediate;   Fair Recent;   Fair Remote;   Fair  Judgement:  Fair  Insight:  Fair  Psychomotor Activity:  Normal  Concentration:  Fair  Recall:  [employer] of Knowledge:Fair  Language: Fair  Akathisia:  No  Handed:  Right  AIMS (if indicated):     Assets:  Desire for Improvement Housing Social Support  Sleep:  Number of Hours: 6.75  Cognition: WNL  ADL's:  Intact  In full contact with reality. There are no active S/S of withdrawal. There are no active SI plans or intent. He is willing and motivated to pursue abstinence. He plans to ask for a reassessment of his knee condition to see if it can be replaces Mental Status Per Nursing Assessment::   On Admission:  Suicidal ideation indicated by patient, Self-harm thoughts  Demographic Factors:  Male and Caucasian  Loss Factors: Decline in physical health  Historical Factors: denies  Risk Reduction Factors:   Positive social support  Continued Clinical Symptoms:  Depression:   Comorbid alcohol abuse/dependence Alcohol/Substance Abuse/Dependencies Chronic Pain Medical Diagnoses and Treatments/Surgeries  Cognitive Features That Contribute To Risk:  None    Suicide Risk:  Minimal: No identifiable suicidal ideation.  Patients presenting with no risk factors but with morbid ruminations; may be classified as minimal risk based on the severity of the depressive symptoms  Follow-up Information    Go to [facility].   Specialty:  Behavioral Health   Why:  Walk-in clinic Monday-Friday between 8am to 3pm for assessment  for therapy and medication management services. Please arrive early in order to be seen as quickly as possible.    Contact information:   [place] [address] [phone]        Follow up with [facility]  On 11/08/2015.   Why:  Screening for possible admission on this date at 8:00AM. Please bring Photo ID/Proof of [employer] residency and Medicaid card to this appt. Thank you.    Contact information:   [address] [place], [place] [address] Phone: [phone] Fax: [phone]      Plan Of Care/Follow-up recommendations:  Activity:  as tolerated Diet:  regular Follow up as above Tylene Quashie A, MD 11/07/2015, 12:06 PM

## 2015-11-07 NOTE — Progress Notes (Signed)
  [facility] Adult Case Management Discharge Plan :  Will you be returning to the same living situation after discharge: Yes, patient plans to return to previous living situation At discharge, do you have transportation home?: Yes,  bus pass Do you have the ability to pay for your medications: Yes,  patient will be provided with prescriptions at discharge  Release of information consent forms completed and in the chart;  Patient's signature needed at discharge.  Patient to Follow up at: Follow-up Information    Go to [facility].   Specialty:  Behavioral Health   Why:  Walk-in clinic Monday-Friday between 8am to 3pm for assessment for therapy and medication management services. Please arrive early in order to be seen as quickly as possible.    Contact information:   [address] [place] [address] [phone]       Follow up with [facility]  On 11/08/2015.   Why:  Screening for possible admission on this date at 8:00AM. Please bring Photo ID/Proof of [employer] residency and Medicaid card to this appt. Thank you.    Contact information:   [address] [place], [place] [address] Phone: [phone] Fax: [phone]      Next level of care provider has access to [place] and Suicide Prevention discussed: Yes,  with patient and sister  Have you used any form of tobacco in the last 30 days? (Cigarettes, Smokeless Tobacco, Cigars, and/or Pipes): Yes  Has patient been referred to the Quitline?: Patient refused referral  Patient has been referred for addiction treatment: Yes  Lether Tesch, Casimiro Needle 11/07/2015, 11:50 AM

## 2015-11-07 NOTE — Discharge Summary (Signed)
Physician Discharge Summary Note  Patient:  Samuel Reyes is an 60 y.o., male MRN:  [id number] DOB:  February 15, 1956 Patient phone:  [phone] (home)  Patient address:   [place] [address],  Total Time spent with patient: Greater than 30 minutes  Date of Admission:  10/29/2015 Date of Discharge: 11-07-15  Reason for Admission: Alcohol detox/mood stabilization  Principal Problem: Major depressive disorder, recurrent episode, moderate with anxious distress [facility])  Discharge Diagnoses: Patient Active Problem List   Diagnosis Date Noted  . Major depressive disorder, recurrent episode, moderate with anxious distress ([place]) [F33.1] 10/30/2015  . Alcohol use disorder, severe, dependence ([place]) [F10.20] 10/29/2015  . COPD exacerbation ([place]) [J44.1] 09/20/2015  . Acute respiratory failure with hypoxia ([place]) [J96.01] 09/18/2015  . Alcoholism /alcohol abuse ([place]) [F10.20]   . Suicidal ideation [R45.851] 09/17/2015  . Alcohol intoxication ([place]) [F10.129] 09/17/2015  . Depression [F32.9] 09/17/2015  . Benign essential HTN [I10] 09/17/2015  . Hypokalemia [E87.6] 09/17/2015  . Hyponatremia [E87.1] 09/17/2015  . Coffee ground emesis [K92.0] 09/17/2015  . COPD (chronic obstructive pulmonary disease) ([place]) [J44.9] 02/17/2013  . GERD (gastroesophageal reflux disease) [K21.9] 02/17/2013   Past Psychiatric History: Alcoholism, Major depression.  Past Medical History:  Past Medical History  Diagnosis Date  . COPD (chronic obstructive pulmonary disease) [facility])     Past Surgical History  Procedure Laterality Date  . Gastrectomy    . Shoulder surgery Bilateral     3 surgeries on on left, 2 surgeries on right   . Rt knee arthroscopic surgery    . Back surgery      3 cervical spine surgeries C4-C5 fused  . Hernia repair    . Finger surgery Left     2nd, 3rd, & 4th fingers were cut off by table saw and reattached  . Colonoscopy N/A 01/04/2014    Procedure: COLONOSCOPY;  Surgeon: Danie Binder, MD;  Location[facility];  Service: Endoscopy;  Laterality: N/A;  1:45  . Esophagogastroduodenoscopy N/A 01/04/2014    Procedure: ESOPHAGOGASTRODUODENOSCOPY (EGD);  Surgeon: Danie Binder, MD;  Location: [facility] SU[facility];  Service: Endoscopy;  Laterality: N/A;  . Incisional hernia repair N/A 01/20/2014    Procedure: LAPAROSCOPIC RECURRENT  INCISIONAL HERNIA with mesh;  Surgeon: Edward Jolly, MD;  Location: [facility] ORS;  Service: General;  Laterality: N/A;   Family History:  Family History  Problem Relation Age of Onset  . Cancer Father     bone  . Cancer Brother     lungs  . Stroke Maternal Grandmother   . Colon cancer Neg Hx   . Asthma Son     died at age 2 in his sleep   . Spina bifida Son     died at age 28   . Dementia Mother    Family Psychiatric  History: See Md's SRA  Social History:  History  Alcohol Use  . 39.0 oz/week  . 65 Cans of beer per week    Comment: 4-40oz today 1/2gallon vodka a month. 5 to 6 cases a beer a month.     History  Drug Use No    Comment: denid using any drugs    Social History   Social History  . Marital Status: Widowed    Spouse Name: N/A  . Number of Children: N/A  . Years of Education: N/A   Occupational History  . Disability    Social History Main Topics  . Smoking status: Current Every Day Smoker --  1.00 packs/day for 45 years    Types: Cigarettes    Start date: 07/30/1966  . Smokeless tobacco: Never Used  . Alcohol Use: 39.0 oz/week    65 Cans of beer per week     Comment: 4-40oz today 1/2gallon vodka a month. 5 to 6 cases a beer a month.  . Drug Use: No     Comment: denid using any drugs  . Sexual Activity: No   Other Topics Concern  . None   Social History Narrative   Hospital Course: Samuel Reyes is a 60 y.o. Caucasian male, who has a hx of depression, alcohol abuse, he is widowed, lives in a rooming house in [place], [place], has multiple medical issues. He presented to the [facility] cone Emergency  Department due to severe alcohol abuse and depression with some suicidal ideations.  Samuel Reyes was admitted to the hospital with a BAL of 209 per toxicology tests results. He admits having been drinking a lot & it has worsened. He was also presenting with worsening symptoms of depression triggering suicidal ideations. He was here for alcohol/drug detox/mood stabilization treatments.  His detoxification treatment was achieved using Ativan detox regimen on a tapering dose format. Samuel Reyes was also medicated & discharged on; Remeron 15 mg for depression/insomnia, Gabapentin 100 mg for agitation/substance withdrawal symptoms & Trazodone 100 mg for insomnia. He was resumed on his pertinent home medications for his other previously existing medical issues including antibiotic therapy for URI. He tolerated his treatment regimen without any adverse effects or reactions reported. Samuel Reyes was enrolled in the group counseling sessions, AA/NA meetings being offered and held on this unit. He participated and learned coping skills.   Samuel Reyes has completed detox treatment and his mood is stable. This is evidenced by his reports of improved mood and absence of substance withdrawal symptoms. He will resume psychiatric care and routine medication management at the [facility] clinic here in [place], [place]. He has a referral to the [facility] treatment center for admission screening on 11-08-15. He is provided with all the pertinent information needed to make these appointments without problems. He was encouraged to join/attend AA/NA meetings being offered and held within his community to achieve & maintain maximum sobriety.   Upon discharge, he adamantly denies any suicidal, homicidal ideations, auditory, visual hallucinations, delusional thoughts, paranoia & or withdrawal symptoms. Samuel Reyes [facility] with all personal belongings in no apparent distress. Transportation per city bus. Bus pass provided by [facility]   Physical  Findings: AIMS: Facial and Oral Movements Muscles of Facial Expression: None, normal Lips and Perioral Area: None, normal Jaw: None, normal Tongue: None, normal,Extremity Movements Upper (arms, wrists, hands, fingers): None, normal Lower (legs, knees, ankles, toes): None, normal, Trunk Movements Neck, shoulders, hips: None, normal, Overall Severity Severity of abnormal movements (highest score from questions above): None, normal Incapacitation due to abnormal movements: None, normal Patient's awareness of abnormal movements (rate only patient's report): No Awareness, Dental Status Current problems with teeth and/or dentures?: No Does patient usually wear dentures?: No  CIWA:  CIWA-Ar Total: 0 COWS:  COWS Total Score: 3  Musculoskeletal: Strength & Muscle Tone: within normal limits Gait & Station: normal Patient leans: N/A  Psychiatric Specialty Exam: Review of Systems  Constitutional: Negative.   HENT: Negative.   Eyes: Negative.   Respiratory: Negative.   Cardiovascular: Negative.   Gastrointestinal: Negative.   Genitourinary: Negative.   Musculoskeletal: Negative.   Skin: Negative.   Neurological: Negative.   Endo/Heme/Allergies: Negative.   Psychiatric/Behavioral: Positive for depression (  Stable) and substance abuse (Alcohol dependence). Negative for suicidal ideas, hallucinations and memory loss. The patient has insomnia (Stable). The patient is not nervous/anxious.     Blood pressure 121/75, pulse 88, temperature 98 F (36.7 C), temperature source Oral, resp. rate 16, height 5\' 5"  (1.651 m), weight 70.308 kg (155 lb), SpO2 95 %.Body mass index is 25.79 kg/(m^2).  See Md's SRA   Have you used any form of tobacco in the last 30 days? (Cigarettes, Smokeless Tobacco, Cigars, and/or Pipes): Yes  Has this patient used any form of tobacco in the last 30 days? (Cigarettes, Smokeless Tobacco, Cigars, and/or Pipes) Yes, Yes, A prescription for an FDA-approved tobacco cessation  medication was offered at discharge and the patient refused  Blood Alcohol level:  Lab Results  Component Value Date   [facility] 209* 10/28/2015   ETH 287* [id number]    Metabolic Disorder Labs:  Lab Results  Component Value Date   HGBA1C 5.6 01/18/2015   MPG 114 01/18/2015   No results found for: PROLACTIN Lab Results  Component Value Date   CHOL 247* 02/17/2013   TRIG 144 02/17/2013   HDL 79 02/17/2013   CHOLHDL 3.1 02/17/2013   VLDL 29 02/17/2013   LDLCALC 139* 02/17/2013   See Psychiatric Specialty Exam and Suicide Risk Assessment completed by Attending Physician prior to discharge.  Discharge destination:  Home  Is patient on multiple antipsychotic therapies at discharge:  No   Has Patient had three or more failed trials of antipsychotic monotherapy by history:  No  Recommended Plan for Multiple Antipsychotic Therapies: NA    Medication List    STOP taking these medications        cloNIDine 0.2 MG tablet  Commonly known as:  CATAPRES     FLUoxetine 20 MG capsule  Commonly known as:  PROZAC     nystatin 100000 UNIT/ML suspension  Commonly known as:  MYCOSTATIN     ondansetron 4 MG tablet  Commonly known as:  ZOFRAN     predniSONE 10 MG tablet  Commonly known as:  DELTASONE      TAKE these medications      Indication   albuterol 108 (90 Base) MCG/ACT inhaler  Commonly known as:  PROVENTIL HFA;VENTOLIN HFA  Inhale 1-2 puffs into the lungs every 4 (four) hours as needed for wheezing.   Indication:  Asthma     budesonide 0.25 MG/2ML nebulizer solution  Commonly known as:  PULMICORT  Take 2 mLs (0.25 mg total) by nebulization 2 (two) times daily. For COPD   Indication:  Asthma     gabapentin 100 MG capsule  Commonly known as:  NEURONTIN  Take 1 capsule (100 mg total) by mouth 3 (three) times daily. For agitation/alcohol withdrawal syndrome   Indication:  Agitation, Alcohol Withdrawal Syndrome     Ipratropium-Albuterol 20-100 MCG/ACT Aers respimat   Commonly known as:  COMBIVENT  Inhale 1 puff into the lungs every 6 (six) hours as needed for wheezing or shortness of breath.   Indication:  Asthma     levofloxacin 500 MG tablet  Commonly known as:  LEVAQUIN  Take 1 tablet (500 mg total) by mouth daily. For Respiratory infection   Indication:  URI     lidocaine 5 %  Commonly known as:  LIDODERM  Place 1 patch onto the skin daily. Remove & Discard patch within 12 hours or as directed by MD: For pain management:   Indication:  Pain management     metoprolol tartrate  25 MG tablet  Commonly known as:  LOPRESSOR  Take 1 tablet (25 mg total) by mouth daily before breakfast. For high blood pressure   Indication:  High Blood Pressure     mirtazapine 15 MG tablet  Commonly known as:  REMERON  Take 1 tablet (15 mg total) by mouth at bedtime. For depression/insomnia   Indication:  Trouble Sleeping, Major Depressive Disorder     nicotine 21 mg/24hr patch  Commonly known as:  NICODERM CQ - dosed in mg/24 hours  Place 1 patch (21 mg total) onto the skin daily. For nicotine addiction   Indication:  Nicotine Addiction     pantoprazole 40 MG tablet  Commonly known as:  PROTONIX  Take 1 tablet (40 mg total) by mouth daily. For acid reflux   Indication:  Gastroesophageal Reflux Disease     traZODone 100 MG tablet  Commonly known as:  DESYREL  Take 1 tablet (100 mg total) by mouth at bedtime as needed for sleep.   Indication:  Trouble Sleeping       Follow-up Information    Go to [facility].   Specialty:  Behavioral Health   Why:  Social Worker will call and confirm your upcoming appointment date/time.   Contact information:   [place] [address] [phone]       Follow up with [facility]  On 11/08/2015.   Why:  Screening for possible admission on this date at 8:00AM. Please bring Photo ID/Proof of [employer] residency and Medicaid card to this appt. Thank you.    Contact information:   [address] [place], [place] [address] Phone: [phone] Fax: [phone]     Follow-up recommendations:  Activity:  As tolerated Diet: As recommended by your primary care doctor. Keep all scheduled follow-up appointments as recommended.  Comments:  Take all your medications as prescribed by your mental healthcare provider. Report any adverse effects and or reactions from your medicines to your outpatient provider promptly. Patient is instructed and cautioned to not engage in alcohol and or illegal drug use while on prescription medicines. In the event of worsening symptoms, patient is instructed to call the crisis hotline, 911 and or go to the nearest ED for appropriate evaluation and treatment of symptoms. Follow-up with your primary care provider for your other medical issues, concerns and or health care needs.   Signed: Encarnacion Slates, NP, PMHNP-BC 11/07/2015, 11:29 AM   I personally assessed the patient and formulated the plan Geralyn Flash A. Sabra Heck, M.D.

## 2015-11-07 NOTE — BHH Suicide Risk Assessment (Signed)
[facility] INPATIENT:  Family/Significant Other Suicide Prevention Education  Suicide Prevention Education:  Education Completed; sister Lolita Lenz [phone],  (name of family member/significant other) has been identified by the patient as the family member/significant other with whom the patient will be residing, and identified as the person(s) who will aid the patient in the event of a mental health crisis (suicidal ideations/suicide attempt).  With written consent from the patient, the family member/significant other has been provided the following suicide prevention education, prior to the and/or following the discharge of the patient.  The suicide prevention education provided includes the following:  Suicide risk factors  Suicide prevention and interventions  National Suicide Hotline telephone number  [facility] assessment telephone number  [facility] Emergency Assistance [place] and/or Residential Mobile Crisis Unit telephone number  Request made of family/significant other to:  Remove weapons (e.g., guns, rifles, knives), all items previously/currently identified as safety concern.    Remove drugs/medications (over-the-counter, prescriptions, illicit drugs), all items previously/currently identified as a safety concern.  The family member/significant other verbalizes understanding of the suicide prevention education information provided.  The family member/significant other agrees to remove the items of safety concern listed above.  Pawel Soules, Casimiro Needle 11/07/2015, 11:39 AM

## 2015-11-07 NOTE — BHH Group Notes (Signed)
   [facility] LCSW Aftercare Discharge Planning Group Note  11/07/2015  8:45 AM   Participation Quality: Alert, Appropriate and Oriented  Mood/Affect: Appropriate  Depression Rating: 5  Anxiety Rating: 5  Thoughts of Suicide: Pt denies SI/HI  Will you contract for safety? Yes  Current AVH: Pt denies  Plan for Discharge/Comments: Pt attended discharge planning group and actively participated in group. CSW provided pt with today's workbook. Patient plans to return to previous living situation to follow up with outpatient services.   Transportation Means: Pt reports access to transportation  Supports: No supports mentioned at this time  Tilden Fossa, MSW, [employer] Clinical Social Worker [employer] [phone]

## 2015-11-07 NOTE — Tx Team (Signed)
Interdisciplinary Treatment Plan Update (Adult)  Date:  11/07/2015  Time Reviewed:  9:30am  Progress in Treatment: Attending groups: Yes. Participating in groups:  Yes. Taking medication as prescribed:  Yes. Tolerating medication:  Yes. Family/Significant othe contact made: Yes, patient spoke with sister Patient understands diagnosis:  Yes. and As evidenced by:  seeking treatment for alcohol abuse, depression, SI, and for medication management Discussing patient identified problems/goals with staff:  Yes. Medical problems stabilized or resolved:  Yes. Denies suicidal/homicidal ideation: Yes. Issues/concerns per patient self-inventory:  Other:  Discharge Plan or Barriers: CSW assessing for appropriate referrals. Pt is unsure if he wants to pursue inpatient or outpatient treatment at this time. Pt reports that he is living on second story of boarding house and needs to move due to his physical limitations "I have a hard time going up stairs."   Reason for Continuation of Hospitalization: Depression Medication stabilization Withdrawal symptoms  Comments:  Samuel Reyes is an 60 y.o. male who came to the Emergency Department due to severe alcohol abuse and depression with some suicidal ideations. He states that he "doesn't want to live anymore". When asked if he wanted to kill himself he states "I tried that, it didn't work." He states that he tried to slit his wrists 4 years ago and overdosed on medication 2 years ago. He was not admitted inpatient either of these times. He was admitted to [facility] in 2016 for alcohol dependence. He states that he started drinking when he was 38 after his first son died and now drinks 5-9 40oz beers daily. He states his last drink was at 9am this morning. He reports that he has bad withdrawal symptoms and is starting to "get the shakes". He states that he also lost his other son 6 years ago and his wife 3 years ago. He says that he has been having a lot of  depression since then and just "wants to die" to not be in pain anymore. He denies HI or A/V hallucinations. Diagnosis: Major Depressive Disorder Recurrent Severe, Alcohol use disorder severe    Estimated length of stay:  Discharge anticipated for today 11/07/15   New goal(s): to develop effective aftercare plan.   Additional Comments:  Patient and CSW reviewed pt's identified goals and treatment plan. Patient verbalized understanding and agreed to treatment plan. CSW reviewed [facility] "Discharge Process and Patient Involvement" Form. Pt verbalized understanding of information provided and signed form.    Review of initial/current patient goals per problem list:  1. Goal(s): Patient will participate in aftercare plan  Met:Yes  Target date: at discharge  As evidenced by: Patient will participate within aftercare plan AEB aftercare provider and housing plan at discharge being identified.  4/3: CSW assessing for appropriate referrals.  4/7: Pt has [facility] screening on Tuesday. Plans to return home and followup at [facility] and is trying to find different boarding house or room to rent. Unsure if he wants to pursue [facility] at this time.  4/10: Goal met. Patient plans to return to previous living situation to follow up with outpatient services.   2. Goal (s): Patient will exhibit decreased depressive symptoms and suicidal ideations.  Met: Adequate for discharge per MD   Target date: at discharge  As evidenced by: Patient will utilize self rating of depression at 3 or below and demonstrate decreased signs of depression or be deemed stable for discharge by MD.  4/3: Pt rates depression as 8/10 and presents with depressed mood/flat affect. He denies SI/HI/AVH today.  4/7: Pt rates depression as 8. Depressed mood.  4/10: Patient rates depression at 5, reports feeling safe for discharge.   3. Goal(s): Patient will demonstrate decreased signs of withdrawal due to substance  abuse  Met:Yes  Target date:at discharge   As evidenced by: Patient will produce a CIWA/COWS score of 0, have stable vitals signs, and no symptoms of withdrawal.  4/3: Pt reports mild withdrawals today with CIWA score of 4 and stable vitals. Goal progressing.   4/7: Pt reports mild withdrawals with CIWA of 1 and high pulse. Goal continuing to progress.   4/10: Goal met. No withdrawal symptoms reported at this time per medical chart.    Attendees: Patient:    Family:    Physician: Dr. Sabra Heck 11/07/2015 9:30 AM  Nursing: Gaylan Gerold, Mayra Neer, RN 11/07/2015 9:30 AM  Clinical Social Worker: Erasmo Downer Zakeya Junker, LCSW 11/07/2015 9:30 AM  Other:  11/07/2015 9:30 AM   11/07/2015 9:30 AM   11/07/2015 9:30 AM  Other: Agustina Caroli, NP 11/07/2015 9:30 AM        Scribe for Treatment Team:   Tilden Fossa, [place] Worker [facility] [phone]

## 2015-11-15 ENCOUNTER — Emergency Department (HOSPITAL_COMMUNITY): Payer: Medicaid Other

## 2015-11-15 ENCOUNTER — Inpatient Hospital Stay (HOSPITAL_COMMUNITY)
Admission: EM | Admit: 2015-11-15 | Discharge: 2015-11-17 | DRG: 392 | Disposition: A | Discharge status: Home / Self Care | Payer: Medicaid Other | Attending: Family Medicine | Admitting: Family Medicine

## 2015-11-15 ENCOUNTER — Encounter (HOSPITAL_COMMUNITY): Payer: Self-pay

## 2015-11-15 DIAGNOSIS — F1020 Alcohol dependence, uncomplicated: Secondary | ICD-10-CM | POA: Diagnosis present

## 2015-11-15 DIAGNOSIS — K648 Other hemorrhoids: Secondary | ICD-10-CM | POA: Diagnosis present

## 2015-11-15 DIAGNOSIS — R55 Syncope and collapse: Secondary | ICD-10-CM | POA: Diagnosis present

## 2015-11-15 DIAGNOSIS — I10 Essential (primary) hypertension: Secondary | ICD-10-CM | POA: Diagnosis present

## 2015-11-15 DIAGNOSIS — R0789 Other chest pain: Secondary | ICD-10-CM | POA: Diagnosis present

## 2015-11-15 DIAGNOSIS — R1084 Generalized abdominal pain: Secondary | ICD-10-CM | POA: Diagnosis not present

## 2015-11-15 DIAGNOSIS — K529 Noninfective gastroenteritis and colitis, unspecified: Principal | ICD-10-CM | POA: Diagnosis present

## 2015-11-15 DIAGNOSIS — IMO0001 Reserved for inherently not codable concepts without codable children: Secondary | ICD-10-CM | POA: Diagnosis present

## 2015-11-15 DIAGNOSIS — R079 Chest pain, unspecified: Secondary | ICD-10-CM | POA: Diagnosis not present

## 2015-11-15 DIAGNOSIS — E860 Dehydration: Secondary | ICD-10-CM | POA: Diagnosis present

## 2015-11-15 DIAGNOSIS — R197 Diarrhea, unspecified: Secondary | ICD-10-CM

## 2015-11-15 DIAGNOSIS — R112 Nausea with vomiting, unspecified: Secondary | ICD-10-CM | POA: Diagnosis present

## 2015-11-15 DIAGNOSIS — F329 Major depressive disorder, single episode, unspecified: Secondary | ICD-10-CM | POA: Diagnosis present

## 2015-11-15 DIAGNOSIS — F17210 Nicotine dependence, cigarettes, uncomplicated: Secondary | ICD-10-CM | POA: Diagnosis present

## 2015-11-15 DIAGNOSIS — F10929 Alcohol use, unspecified with intoxication, unspecified: Secondary | ICD-10-CM | POA: Diagnosis present

## 2015-11-15 DIAGNOSIS — F32A Depression, unspecified: Secondary | ICD-10-CM | POA: Diagnosis present

## 2015-11-15 DIAGNOSIS — Z7951 Long term (current) use of inhaled steroids: Secondary | ICD-10-CM

## 2015-11-15 DIAGNOSIS — K219 Gastro-esophageal reflux disease without esophagitis: Secondary | ICD-10-CM | POA: Diagnosis present

## 2015-11-15 DIAGNOSIS — F10229 Alcohol dependence with intoxication, unspecified: Secondary | ICD-10-CM | POA: Diagnosis present

## 2015-11-15 DIAGNOSIS — R109 Unspecified abdominal pain: Secondary | ICD-10-CM | POA: Diagnosis present

## 2015-11-15 DIAGNOSIS — F10129 Alcohol abuse with intoxication, unspecified: Secondary | ICD-10-CM | POA: Diagnosis not present

## 2015-11-15 DIAGNOSIS — J449 Chronic obstructive pulmonary disease, unspecified: Secondary | ICD-10-CM | POA: Diagnosis present

## 2015-11-15 HISTORY — DX: Headache: R51

## 2015-11-15 HISTORY — DX: Headache, unspecified: R51.9

## 2015-11-15 LAB — URINALYSIS, ROUTINE W REFLEX MICROSCOPIC
Bilirubin Urine: NEGATIVE
GLUCOSE, UA: NEGATIVE mg/dL
Hgb urine dipstick: NEGATIVE
KETONES UR: NEGATIVE mg/dL
LEUKOCYTES UA: NEGATIVE
Nitrite: NEGATIVE
PH: 5.5 (ref 5.0–8.0)
PROTEIN: NEGATIVE mg/dL
SPECIFIC GRAVITY, URINE: 1.009 (ref 1.005–1.030)

## 2015-11-15 LAB — RAPID URINE DRUG SCREEN, HOSP PERFORMED
Amphetamines: NOT DETECTED
Barbiturates: NOT DETECTED
Benzodiazepines: NOT DETECTED
COCAINE: NOT DETECTED
OPIATES: NOT DETECTED
Tetrahydrocannabinol: NOT DETECTED

## 2015-11-15 LAB — COMPREHENSIVE METABOLIC PANEL
ALT: 28 U/L (ref 17–63)
AST: 30 U/L (ref 15–41)
Albumin: 3.7 g/dL (ref 3.5–5.0)
Alkaline Phosphatase: 74 U/L (ref 38–126)
Anion gap: 15 (ref 5–15)
BUN: <5 mg/dL — ABNORMAL LOW (ref 6–20)
CHLORIDE: 104 mmol/L (ref 101–111)
CO2: 25 mmol/L (ref 22–32)
CREATININE: 0.81 mg/dL (ref 0.61–1.24)
Calcium: 8.7 mg/dL — ABNORMAL LOW (ref 8.9–10.3)
Glucose, Bld: 108 mg/dL — ABNORMAL HIGH (ref 65–99)
Potassium: 3.5 mmol/L (ref 3.5–5.1)
Sodium: 144 mmol/L (ref 135–145)
Total Bilirubin: 0.4 mg/dL (ref 0.3–1.2)
Total Protein: 7.5 g/dL (ref 6.5–8.1)

## 2015-11-15 LAB — CBC WITH DIFFERENTIAL/PLATELET
BASOS ABS: 0 10*3/uL (ref 0.0–0.1)
Basophils Relative: 0 %
EOS ABS: 0 10*3/uL (ref 0.0–0.7)
EOS PCT: 0 %
HCT: 45.1 % (ref 39.0–52.0)
Hemoglobin: 14.6 g/dL (ref 13.0–17.0)
LYMPHS PCT: 18 %
Lymphs Abs: 1.8 10*3/uL (ref 0.7–4.0)
MCH: 27.5 pg (ref 26.0–34.0)
MCHC: 32.4 g/dL (ref 30.0–36.0)
MCV: 85.1 fL (ref 78.0–100.0)
Monocytes Absolute: 0.7 10*3/uL (ref 0.1–1.0)
Monocytes Relative: 7 %
Neutro Abs: 7.5 10*3/uL (ref 1.7–7.7)
Neutrophils Relative %: 75 %
PLATELETS: 503 10*3/uL — AB (ref 150–400)
RBC: 5.3 MIL/uL (ref 4.22–5.81)
RDW: 18 % — ABNORMAL HIGH (ref 11.5–15.5)
WBC: 10.1 10*3/uL (ref 4.0–10.5)

## 2015-11-15 LAB — I-STAT TROPONIN, ED
TROPONIN I, POC: 0 ng/mL (ref 0.00–0.08)
TROPONIN I, POC: 0 ng/mL (ref 0.00–0.08)

## 2015-11-15 LAB — LIPASE, BLOOD: LIPASE: 20 U/L (ref 11–51)

## 2015-11-15 LAB — ETHANOL: Alcohol, Ethyl (B): 357 mg/dL (ref <5)

## 2015-11-15 MED ORDER — TRAZODONE HCL 100 MG PO TABS: 100.0000 mg | ORAL_TABLET | Freq: Every evening | ORAL | Status: DC | PRN

## 2015-11-15 MED ORDER — MORPHINE SULFATE (PF) 2 MG/ML IV SOLN
2.0000 mg | INTRAVENOUS | Status: DC | PRN
  Administered 2015-11-16 – 2015-11-17 (×3): 2 mg via INTRAVENOUS
  Filled 2015-11-15 (×3): qty 1

## 2015-11-15 MED ORDER — SODIUM CHLORIDE 0.9% FLUSH
3.0000 mL | Freq: Two times a day (BID) | INTRAVENOUS | Status: DC
  Administered 2015-11-17: 3 mL via INTRAVENOUS

## 2015-11-15 MED ORDER — GUAIFENESIN ER 600 MG PO TB12: 600.0000 mg | ORAL_TABLET | Freq: Two times a day (BID) | ORAL | Status: DC | PRN

## 2015-11-15 MED ORDER — GABAPENTIN 100 MG PO CAPS
100.0000 mg | ORAL_CAPSULE | Freq: Three times a day (TID) | ORAL | Status: DC
  Administered 2015-11-16 – 2015-11-17 (×4): 100 mg via ORAL
  Filled 2015-11-15 (×4): qty 1

## 2015-11-15 MED ORDER — LORAZEPAM 1 MG PO TABS
1.0000 mg | ORAL_TABLET | Freq: Four times a day (QID) | ORAL | Status: DC | PRN
Start: 2015-11-15 — End: 2015-11-17

## 2015-11-15 MED ORDER — LORAZEPAM 2 MG/ML IJ SOLN
1.0000 mg | Freq: Four times a day (QID) | INTRAMUSCULAR | Status: DC | PRN
  Filled 2015-11-15: qty 1

## 2015-11-15 MED ORDER — IPRATROPIUM-ALBUTEROL 20-100 MCG/ACT IN AERS: 1.0000 | INHALATION_SPRAY | Freq: Four times a day (QID) | RESPIRATORY_TRACT | Status: DC | PRN

## 2015-11-15 MED ORDER — MIRTAZAPINE 15 MG PO TABS
15.0000 mg | ORAL_TABLET | Freq: Every day | ORAL | Status: DC
  Administered 2015-11-16: 15 mg via ORAL
  Filled 2015-11-15: qty 1

## 2015-11-15 MED ORDER — FOLIC ACID 1 MG PO TABS
1.0000 mg | ORAL_TABLET | Freq: Every day | ORAL | Status: DC
Start: 2015-11-16 — End: 2015-11-17
  Administered 2015-11-16 – 2015-11-17 (×2): 1 mg via ORAL
  Filled 2015-11-15 (×2): qty 1

## 2015-11-15 MED ORDER — LORAZEPAM 2 MG/ML IJ SOLN: 0.0000 mg | Freq: Two times a day (BID) | INTRAMUSCULAR | Status: DC

## 2015-11-15 MED ORDER — ONDANSETRON HCL 4 MG/2ML IJ SOLN
4.0000 mg | Freq: Three times a day (TID) | INTRAMUSCULAR | Status: DC | PRN
  Administered 2015-11-16 – 2015-11-17 (×2): 4 mg via INTRAVENOUS
  Filled 2015-11-15 (×2): qty 2

## 2015-11-15 MED ORDER — IPRATROPIUM-ALBUTEROL 20-100 MCG/ACT IN AERS: 1.0000 | INHALATION_SPRAY | RESPIRATORY_TRACT | Status: DC | PRN

## 2015-11-15 MED ORDER — THIAMINE HCL 100 MG/ML IJ SOLN: 100.0000 mg | Freq: Every day | INTRAMUSCULAR | Status: DC

## 2015-11-15 MED ORDER — LORAZEPAM 2 MG/ML IJ SOLN
0.0000 mg | Freq: Four times a day (QID) | INTRAMUSCULAR | Status: DC
  Administered 2015-11-16 (×4): 1 mg via INTRAVENOUS
  Filled 2015-11-15 (×3): qty 1

## 2015-11-15 MED ORDER — BUDESONIDE 0.25 MG/2ML IN SUSP
0.2500 mg | Freq: Two times a day (BID) | RESPIRATORY_TRACT | Status: DC
  Administered 2015-11-16 – 2015-11-17 (×3): 0.25 mg via RESPIRATORY_TRACT
  Filled 2015-11-15 (×3): qty 2

## 2015-11-15 MED ORDER — LORAZEPAM 2 MG/ML IJ SOLN
1.0000 mg | Freq: Once | INTRAMUSCULAR | Status: AC
  Administered 2015-11-15: 1 mg via INTRAVENOUS
  Filled 2015-11-15: qty 1

## 2015-11-15 MED ORDER — VITAMIN B-1 100 MG PO TABS
100.0000 mg | ORAL_TABLET | Freq: Every day | ORAL | Status: DC
  Administered 2015-11-16 – 2015-11-17 (×2): 100 mg via ORAL
  Filled 2015-11-15 (×2): qty 1

## 2015-11-15 MED ORDER — SODIUM CHLORIDE 0.9 % IV BOLUS (SEPSIS)
1000.0000 mL | Freq: Once | INTRAVENOUS | Status: AC
  Administered 2015-11-15: 1000 mL via INTRAVENOUS

## 2015-11-15 MED ORDER — NITROGLYCERIN 0.4 MG SL SUBL: 0.4000 mg | SUBLINGUAL_TABLET | SUBLINGUAL | Status: DC | PRN

## 2015-11-15 MED ORDER — METOPROLOL TARTRATE 25 MG PO TABS
25.0000 mg | ORAL_TABLET | Freq: Every day | ORAL | Status: DC
  Administered 2015-11-16 – 2015-11-17 (×2): 25 mg via ORAL
  Filled 2015-11-15 (×2): qty 1

## 2015-11-15 MED ORDER — ASPIRIN EC 81 MG PO TBEC
81.0000 mg | DELAYED_RELEASE_TABLET | Freq: Every day | ORAL | Status: DC
  Administered 2015-11-16 – 2015-11-17 (×2): 81 mg via ORAL
  Filled 2015-11-15 (×2): qty 1

## 2015-11-15 MED ORDER — LIDOCAINE 5 % EX PTCH
1.0000 | MEDICATED_PATCH | Freq: Every day | CUTANEOUS | Status: DC
  Administered 2015-11-16 – 2015-11-17 (×2): 1 via TRANSDERMAL
  Filled 2015-11-15 (×3): qty 1

## 2015-11-15 MED ORDER — ENOXAPARIN SODIUM 40 MG/0.4ML ~~LOC~~ SOLN
40.0000 mg | SUBCUTANEOUS | Status: DC
Start: 2015-11-16 — End: 2015-11-17
  Administered 2015-11-17: 40 mg via SUBCUTANEOUS
  Filled 2015-11-15 (×2): qty 0.4

## 2015-11-15 MED ORDER — SODIUM CHLORIDE 0.9 % IV SOLN
INTRAVENOUS | Status: DC
  Administered 2015-11-16: 1000 mL via INTRAVENOUS

## 2015-11-15 MED ORDER — FAMOTIDINE IN NACL 20-0.9 MG/50ML-% IV SOLN
20.0000 mg | Freq: Two times a day (BID) | INTRAVENOUS | Status: DC
  Administered 2015-11-16 – 2015-11-17 (×4): 20 mg via INTRAVENOUS
  Filled 2015-11-15 (×6): qty 50

## 2015-11-15 MED ORDER — ADULT MULTIVITAMIN W/MINERALS CH
1.0000 | ORAL_TABLET | Freq: Every day | ORAL | Status: DC
  Administered 2015-11-16 – 2015-11-17 (×2): 1 via ORAL
  Filled 2015-11-15 (×2): qty 1

## 2015-11-15 MED ORDER — NICOTINE 21 MG/24HR TD PT24
21.0000 mg | MEDICATED_PATCH | Freq: Every day | TRANSDERMAL | Status: DC
  Administered 2015-11-16 – 2015-11-17 (×2): 21 mg via TRANSDERMAL
  Filled 2015-11-15 (×2): qty 1

## 2015-11-15 NOTE — ED Provider Notes (Signed)
  Physical Exam  BP 142/76 mmHg  Pulse 112  Temp(Src) 98 F (36.7 C) (Oral)  Resp 22  Ht 5\' 5"  (1.651 m)  Wt 150 lb (68.04 kg)  BMI 24.96 kg/m2  SpO2 97%  Physical Exam  ED Course  Procedures  MDM Patient is more sober now. States that he was drinking and then began to have chest pain and then passed out. States everything just went black. Reportedly had CPR done by another intoxicated friend. Troponins negative 2 but with the chest pain followed by syncope will discuss with hospitalist about admission.      Davonna Belling, MD 11/15/15 2214

## 2015-11-15 NOTE — H&P (Signed)
History and Physical    [name] [id number] DOB: January 21, 1956 DOA: 11/15/2015  Referring MD/NP/PA:   PCP: Lorayne Marek, MD   Outpatient Specialists: none  Patient coming from:  Home Chief Complaint: syncope, chest pain, nausea, vomiting, diarrhea and abdominal pain  HPI: Samuel Reyes is a 60 y.o. male with medical history significant of tobacco abuse, alcohol abuse, hypertension, COPD, GERD, depression, who presents with syncope, chest pain, nausea, vomiting, diarrhea and abdominal pain.  Per reports, pt had syncope episode after drinking five 40oz beers. He does not know how long he has passed out. His drank friend did CPR for him. After that, he started have chest pain. His CP he is located in the substernal area, constant, moderate, nonradiating. He also has chest wall tenderness. Patient reports that he has been having nausea, vomiting, diarrhea and AP for about 1 week. He had 3 bowel movement with loose stool today. He took levaquin for 5 days for COPD recently. He has mild cough due to smoking and COPD, no shortness of breath, fever or chills. No symptoms of UTI or unilateral weakness.  ED Course: pt was found to have negative troponin 2, negative UDS, lipase of 20, negative urinalysis, WBC 10.1, alcohol level CCCLVII, tachycardia, tachypnea, electrolytes and renal function okay, negative chest x-ray for acute abnormalities. Patient is admitted to inpatient for further evaluation and treatment.  # CT head: Atrophy with mild small vessel disease. The greatest degree of small vessel disease appears in the mid pons in the basilar perforator distribution. There is no intracranial mass, hemorrhage, or extra-axial fluid collection. No acute infarct evident. Mild right sphenoid sinus disease.  # CT cervical spine: No acute fracture or spondylolisthesis. Multifocal arthropathy. Stable ankylosis at C5 and C6. Foci of carotid artery calcification bilaterally. Fluid in the upper  cervical esophagus may be indicative of chronic reflux or could be indicative of achalasia or similar motility disorder. This finding may warrant nonemergent barium swallow or direct visualization of the esophagus to further evaluate.  Can patient participate in ADLs?  Yes  Review of Systems:   General: no fevers, chills, no changes in body weight, has poor appetite, has fatigue HEENT: no blurry vision, hearing changes or sore throat Pulm: no dyspnea, has coughing, no wheezing CV: has chest pain, no palpitations Abd: has nausea, vomiting, abdominal pain, diarrhea, no constipation GU: no dysuria, burning on urination, increased urinary frequency, hematuria  Ext: no leg edema Neuro: no unilateral weakness, numbness, or tingling, no vision change or hearing loss Skin: no rash MSK: No muscle spasm, no deformity, no limitation of range of movement in spin Heme: No easy bruising.  Travel history: No recent long distant travel.  Allergy:  Allergies  Allergen Reactions  . Bee Venom Anaphylaxis  . Penicillins Rash    Has patient had a PCN reaction causing immediate rash, facial/tongue/throat swelling, SOB or lightheadedness with hypotension: {Yes Has patient had a PCN reaction causing severe rash involving mucus membranes or skin necrosis: NO Has patient had a PCN reaction that required hospitalization {Yes Has patient had a PCN reaction occurring within the last 10 years: NO If all of the above answers are "NO", then may proceed with Cephalosporin use.     Past Medical History  Diagnosis Date  . COPD (chronic obstructive pulmonary disease) [facility])     Past Surgical History  Procedure Laterality Date  . Gastrectomy    . Shoulder surgery Bilateral     3 surgeries on on left, 2 surgeries on  right   . Rt knee arthroscopic surgery    . Back surgery      3 cervical spine surgeries C4-C5 fused  . Hernia repair    . Finger surgery Left     2nd, 3rd, & 4th fingers were cut off by table  saw and reattached  . Colonoscopy N/A 01/04/2014    Procedure: COLONOSCOPY;  Surgeon: Danie Binder, MD;  Location: [facility];  Service: Endoscopy;  Laterality: N/A;  1:45  . Esophagogastroduodenoscopy N/A 01/04/2014    Procedure: ESOPHAGOGASTRODUODENOSCOPY (EGD);  Surgeon: Danie Binder, MD;  Location: [facility];  Service: Endoscopy;  Laterality: N/A;  . Incisional hernia repair N/A 01/20/2014    Procedure: LAPAROSCOPIC RECURRENT  INCISIONAL HERNIA with mesh;  Surgeon: Edward Jolly, MD;  Location: [facility];  Service: General;  Laterality: N/A;    Social History:  reports that he has been smoking Cigarettes.  He started smoking about 49 years ago. He has a 45 pack-year smoking history. He has never used smokeless tobacco. He reports that he drinks about 39.0 oz of alcohol per week. He reports that he does not use illicit drugs.  Family History:  Family History  Problem Relation Age of Onset  . Cancer Father     bone  . Cancer Brother     lungs  . Stroke Maternal Grandmother   . Colon cancer Neg Hx   . Asthma Son     died at age 63 in his sleep   . Spina bifida Son     died at age 66   . Dementia Mother      Prior to Admission medications   Medication Sig Start Date End Date Taking? Authorizing Provider  albuterol (PROVENTIL HFA;VENTOLIN HFA) 108 (90 Base) MCG/ACT inhaler Inhale 1-2 puffs into the lungs every 4 (four) hours as needed for wheezing. 11/07/15   Encarnacion Slates, NP  budesonide (PULMICORT) 0.25 MG/2ML nebulizer solution Take 2 mLs (0.25 mg total) by nebulization 2 (two) times daily. For COPD 11/07/15   Encarnacion Slates, NP  gabapentin (NEURONTIN) 100 MG capsule Take 1 capsule (100 mg total) by mouth 3 (three) times daily. For agitation/alcohol withdrawal syndrome 11/07/15   Encarnacion Slates, NP  Ipratropium-Albuterol (COMBIVENT) 20-100 MCG/ACT AERS respimat Inhale 1 puff into the lungs every 6 (six) hours as needed for wheezing or shortness of breath. 11/07/15   Encarnacion Slates,  NP  levofloxacin (LEVAQUIN) 500 MG tablet Take 1 tablet (500 mg total) by mouth daily. For Respiratory infection 11/07/15   Encarnacion Slates, NP  lidocaine (LIDODERM) 5 % Place 1 patch onto the skin daily. Remove & Discard patch within 12 hours or as directed by MD: For pain management: 11/07/15   Encarnacion Slates, NP  metoprolol tartrate (LOPRESSOR) 25 MG tablet Take 1 tablet (25 mg total) by mouth daily before breakfast. For high blood pressure 11/07/15   Encarnacion Slates, NP  mirtazapine (REMERON) 15 MG tablet Take 1 tablet (15 mg total) by mouth at bedtime. For depression/insomnia 11/07/15   Encarnacion Slates, NP  nicotine (NICODERM CQ - DOSED IN MG/24 HOURS) 21 mg/24hr patch Place 1 patch (21 mg total) onto the skin daily. For nicotine addiction 11/07/15   Encarnacion Slates, NP  pantoprazole (PROTONIX) 40 MG tablet Take 1 tablet (40 mg total) by mouth daily. For acid reflux 11/07/15   Encarnacion Slates, NP  traZODone (DESYREL) 100 MG tablet Take 1 tablet (100 mg total)  by mouth at bedtime as needed for sleep. 11/07/15   Encarnacion Slates, NP    Physical Exam: Filed Vitals:   11/15/15 2030 11/15/15 2100 11/15/15 2115 11/15/15 2130  BP: 142/76 117/69  140/75  Pulse: 112 112 130 131  Temp:      TempSrc:      Resp: 22 22 22 21   Height:      Weight:      SpO2: 97% 96% 97% 98%   General: Not in acute distress HEENT:       Eyes: PERRL, EOMI, no scleral icterus.       ENT: No discharge from the ears and nose, no pharynx injection, no tonsillar enlargement.        Neck: No JVD, no bruit, no mass felt. Heme: No neck lymph node enlargement. Cardiac: S1/S2, RRR, No murmurs, No gallops or rubs. Pulm: No rales, wheezing, rhonchi or rubs. Has tenderness over front chest wall. Abd: Soft, nondistended, has tenderness over central abdomen, no rebound pain, no organomegaly, BS present. GU: No hematuria Ext: No pitting leg edema bilaterally. 2+DP/PT pulse bilaterally. Musculoskeletal: No joint deformities, No joint redness or  warmth, no limitation of ROM in spin. Skin: No rashes. Decubitus ulcers Neuro: Alert, oriented X3, cranial nerves II-XII grossly intact, moves all extremities normally. Psych: Patient is not psychotic, no suicidal or hemocidal ideation.  Labs on Admission: I have personally reviewed following labs and imaging studies  CBC:  Recent Labs Lab 11/15/15 1304  WBC 10.1  NEUTROABS 7.5  HGB 14.6  HCT 45.1  MCV 85.1  PLT [id number]*   Basic Metabolic Panel:  Recent Labs Lab 11/15/15 1304  NA 144  K 3.5  CL 104  CO2 25  GLUCOSE 108*  BUN <5*  CREATININE 0.81  CALCIUM 8.7*   GFR: Estimated Creatinine Clearance: 84.4 mL/min (by C-G formula based on Cr of 0.81). Liver Function Tests:  Recent Labs Lab 11/15/15 1304  AST 30  ALT 28  ALKPHOS 74  BILITOT 0.4  PROT 7.5  ALBUMIN 3.7    Recent Labs Lab 11/15/15 1304  LIPASE 20   No results for input(s): AMMONIA in the last 168 hours. Coagulation Profile: No results for input(s): INR, PROTIME in the last 168 hours. Cardiac Enzymes: No results for input(s): CKTOTAL, CKMB, CKMBINDEX, TROPONINI in the last 168 hours. BNP (last 3 results) No results for input(s): PROBNP in the last 8760 hours. HbA1C: No results for input(s): HGBA1C in the last 72 hours. CBG: No results for input(s): GLUCAP in the last 168 hours. Lipid Profile: No results for input(s): CHOL, HDL, LDLCALC, TRIG, CHOLHDL, LDLDIRECT in the last 72 hours. Thyroid Function Tests: No results for input(s): TSH, T4TOTAL, FREET4, T3FREE, THYROIDAB in the last 72 hours. Anemia Panel: No results for input(s): VITAMINB12, FOLATE, FERRITIN, TIBC, IRON, RETICCTPCT in the last 72 hours. Urine analysis:    Component Value Date/Time   COLORURINE YELLOW 11/15/2015 1400   APPEARANCEUR CLEAR 11/15/2015 1400   LABSPEC 1.009 11/15/2015 1400   PHURINE 5.5 11/15/2015 1400   GLUCOSEU NEGATIVE 11/15/2015 1400   HGBUR NEGATIVE 11/15/2015 1400   BILIRUBINUR NEGATIVE 11/15/2015 1400    KETONESUR NEGATIVE 11/15/2015 1400   PROTEINUR NEGATIVE 11/15/2015 1400   UROBILINOGEN 0.2 05/31/2015 2156   NITRITE NEGATIVE 11/15/2015 1400   LEUKOCYTESUR NEGATIVE 11/15/2015 1400   Sepsis Labs: @LABRCNTIP (procalcitonin:4,lacticidven:4) )No results found for this or any previous visit (from the past 240 hour(s)).   Radiological Exams on Admission: Dg Chest 2 View  11/15/2015  CLINICAL DATA:  Chest pain today. Initial encounter. No known injury. EXAM: CHEST  2 VIEW COMPARISON:  PA and lateral chest 10/28/2015 and 12/10/2014. FINDINGS: The lungs are clear. Heart size is normal. No pneumothorax or pleural effusion. Surgical clips at the gastroesophageal junction and resection of the distal left clavicle are noted. IMPRESSION: No acute disease. Electronically Signed   By: Inge Rise M.D.   On: 11/15/2015 13:34   Ct Head Wo Contrast  11/15/2015  CLINICAL DATA:  Pain following fall EXAM: CT HEAD WITHOUT CONTRAST CT CERVICAL SPINE WITHOUT CONTRAST TECHNIQUE: Multidetector CT imaging of the head and cervical spine was performed following the standard protocol without intravenous contrast. Multiplanar CT image reconstructions of the cervical spine were also generated. COMPARISON:  CT head and CT cervical spine October 20, 2015 ; head CT June 20, 2015 FINDINGS: CT HEAD FINDINGS Mild diffuse atrophy is stable. There is no intracranial mass, hemorrhage, extra-axial fluid collection, or midline shift. There is moderate small vessel disease in the mid pons in the basilar perforator distribution. There is mild small vessel disease in the centra semiovale bilaterally. There is no new gray-white compartment lesion. No acute infarct evident. The bony calvarium appears intact. The mastoid air cells are clear. No intraorbital lesions are evident. There is mucosal thickening in the anterior right sphenoid sinus region. CT CERVICAL SPINE FINDINGS There is no acute fracture or spondylolisthesis. The  prevertebral soft tissues and predental space regions are normal. There is stable mid cervical dextroscoliosis. There is stable ankylosis at C5 and C6. There is moderately severe disc space narrowing at C3-4, C4-5, and C6-7. There is facet hypertrophy with exit foraminal narrowing at C3-4 bilaterally, at C4-5 on the right, and at C6-7 on the right. There are foci of carotid artery calcification bilaterally. There is fluid with esophageal thickening in the upper thoracic esophagus. IMPRESSION: CT head: Atrophy with mild small vessel disease. The greatest degree of small vessel disease appears in the mid pons in the basilar perforator distribution. There is no intracranial mass, hemorrhage, or extra-axial fluid collection. No acute infarct evident. Mild right sphenoid sinus disease. CT cervical spine: No acute fracture or spondylolisthesis. Multifocal arthropathy. Stable ankylosis at C5 and C6. Foci of carotid artery calcification bilaterally. Fluid in the upper cervical esophagus may be indicative of chronic reflux or could be indicative of achalasia or similar motility disorder. This finding may warrant nonemergent barium swallow or direct visualization of the esophagus to further evaluate. Electronically Signed   By: Lowella Grip III M.D.   On: 11/15/2015 13:53   Ct Cervical Spine Wo Contrast  11/15/2015  CLINICAL DATA:  Pain following fall EXAM: CT HEAD WITHOUT CONTRAST CT CERVICAL SPINE WITHOUT CONTRAST TECHNIQUE: Multidetector CT imaging of the head and cervical spine was performed following the standard protocol without intravenous contrast. Multiplanar CT image reconstructions of the cervical spine were also generated. COMPARISON:  CT head and CT cervical spine October 20, 2015 ; head CT June 20, 2015 FINDINGS: CT HEAD FINDINGS Mild diffuse atrophy is stable. There is no intracranial mass, hemorrhage, extra-axial fluid collection, or midline shift. There is moderate small vessel disease in the mid  pons in the basilar perforator distribution. There is mild small vessel disease in the centra semiovale bilaterally. There is no new gray-white compartment lesion. No acute infarct evident. The bony calvarium appears intact. The mastoid air cells are clear. No intraorbital lesions are evident. There is mucosal thickening in the anterior right sphenoid sinus region. CT  CERVICAL SPINE FINDINGS There is no acute fracture or spondylolisthesis. The prevertebral soft tissues and predental space regions are normal. There is stable mid cervical dextroscoliosis. There is stable ankylosis at C5 and C6. There is moderately severe disc space narrowing at C3-4, C4-5, and C6-7. There is facet hypertrophy with exit foraminal narrowing at C3-4 bilaterally, at C4-5 on the right, and at C6-7 on the right. There are foci of carotid artery calcification bilaterally. There is fluid with esophageal thickening in the upper thoracic esophagus. IMPRESSION: CT head: Atrophy with mild small vessel disease. The greatest degree of small vessel disease appears in the mid pons in the basilar perforator distribution. There is no intracranial mass, hemorrhage, or extra-axial fluid collection. No acute infarct evident. Mild right sphenoid sinus disease. CT cervical spine: No acute fracture or spondylolisthesis. Multifocal arthropathy. Stable ankylosis at C5 and C6. Foci of carotid artery calcification bilaterally. Fluid in the upper cervical esophagus may be indicative of chronic reflux or could be indicative of achalasia or similar motility disorder. This finding may warrant nonemergent barium swallow or direct visualization of the esophagus to further evaluate. Electronically Signed   By: Lowella Grip III M.D.   On: 11/15/2015 13:53     EKG: Independently reviewed. QTC 467, tachycardia, no ischemic change  Assessment/Plan Principal Problem:   Nausea vomiting and diarrhea Active Problems:   COPD (chronic obstructive pulmonary  disease) (HCC)   GERD (gastroesophageal reflux disease)   Alcohol intoxication ([place])   Depression   Alcoholism /alcohol abuse ([place])   Syncope   Chest pain   Abdominal pain   Nausea vomiting and diarrhea and AP: lipase is normal.  Pt used abx recently. Will need to r/o c diff colitis. Pt may have alcoholic gastritis -will admit to tele bed for observation -check C diff pcr and GI path panel -prn zofran for nausea and morphine for pain -IVF: 2L NS and then 100 cc/h -pepcid IV  GERD: -pepcid  COPD: stable.  -prn Duonebs -pumicort nebs  Chest pain: likely due to muscular skeletal pain since pt had CPR by his friend. He has reproducible tenderness over frontal chest. Trop negative x 2 so far -trop x 3 -ASA 81 mg daily and metoprolol -repeat EKG in am -prn NTG and morphine for severe chest pain -check Ac and FLP  Tobacco abuse and Alcohol abuse: -Did counseling about importance of quitting smoking -Nicotine patch -Did counseling about the importance of quitting drinking -CIWA protocol  Depression: -continue Remeron  Syncope: CT head and C-spine are negative for acute abnormalities. Patient does not have signs of stroke. Likely due to alcohol intoxication. -Frequent neuro check -P/TOT   DVT ppx: SQ Lovenox Code Status: Full code Family Communication: None at bed side.  Disposition Plan:  Anticipate discharge back to previous home environment Consults called: None Admission status:   obs / tele   Date of Service 11/15/2015    Ivor Costa [facility] Pager [phone]  If 7PM-7AM, please contact night-coverage [email or web address] Password [facility] 11/15/2015, 11:09 PM

## 2015-11-15 NOTE — ED Notes (Signed)
Pt. Coming from home via [facility] c/o chest pain after his 'friend' gave him CPR. Pt. Reports drinking five 40oz beers today. Hx of ETOH abuse due to wife and son dying in car accident 3 years ago. Pt. sts he vomiting after chest compressions. Pt. Denies drugs, but EMS reports seeing 'crack pipes' on scene.

## 2015-11-15 NOTE — ED Provider Notes (Signed)
CSN: [id number]     Arrival date & time 11/15/15  1209 History   First MD Initiated Contact with Patient 11/15/15 1229     Chief Complaint  Patient presents with  . Alcohol Intoxication  . Chest Pain   LEVEL 5 CAVEAT DUE TO INTOXICATION  (Consider location/radiation/quality/duration/timing/severity/associated sxs/prior Treatment) HPI  60 year old male with a history of chronic alcohol abuse presents with a chief complaint of chest pain. Patient tells me that he passed out and his friend performed CPR. Patient states after he woke up from thing passed out he had chest pain. States it feels like someone shot him in the chest. Endorses shortness of breath as well. Patient has had at least 5 40 ounce beers. EMS notes there were crack pipes and the friend also seemed intoxicated/high. Patient denies drug use. Patient answers "yes" to multiple other pain complaints including abdominal pain, headache, neck pain, and back pain. History is limited due to acute intoxication. Patient indicates to me that he did not have chest pain prior to passing out and only after compressions were performed.  Past Medical History  Diagnosis Date  . COPD (chronic obstructive pulmonary disease) [facility])    Past Surgical History  Procedure Laterality Date  . Gastrectomy    . Shoulder surgery Bilateral     3 surgeries on on left, 2 surgeries on right   . Rt knee arthroscopic surgery    . Back surgery      3 cervical spine surgeries C4-C5 fused  . Hernia repair    . Finger surgery Left     2nd, 3rd, & 4th fingers were cut off by table saw and reattached  . Colonoscopy N/A 01/04/2014    Procedure: COLONOSCOPY;  Surgeon: Danie Binder, MD;  Location: [facility];  Service: Endoscopy;  Laterality: N/A;  1:45  . Esophagogastroduodenoscopy N/A 01/04/2014    Procedure: ESOPHAGOGASTRODUODENOSCOPY (EGD);  Surgeon: Danie Binder, MD;  Location: [facility];  Service: Endoscopy;  Laterality: N/A;  . Incisional hernia repair  N/A 01/20/2014    Procedure: LAPAROSCOPIC RECURRENT  INCISIONAL HERNIA with mesh;  Surgeon: Edward Jolly, MD;  Location: [facility] ORS;  Service: General;  Laterality: N/A;   Family History  Problem Relation Age of Onset  . Cancer Father     bone  . Cancer Brother     lungs  . Stroke Maternal Grandmother   . Colon cancer Neg Hx   . Asthma Son     died at age 22 in his sleep   . Spina bifida Son     died at age 18   . Dementia Mother    Social History  Substance Use Topics  . Smoking status: Current Every Day Smoker -- 1.00 packs/day for 45 years    Types: Cigarettes    Start date: 07/30/1966  . Smokeless tobacco: Never Used  . Alcohol Use: 39.0 oz/week    65 Cans of beer per week     Comment: 'tries to drink every day' ETOH abuse    Review of Systems  Unable to perform ROS: Mental status change      Allergies  Bee venom and Penicillins  Home Medications   Prior to Admission medications   Medication Sig Start Date End Date Taking? Authorizing Provider  albuterol (PROVENTIL HFA;VENTOLIN HFA) 108 (90 Base) MCG/ACT inhaler Inhale 1-2 puffs into the lungs every 4 (four) hours as needed for wheezing. 11/07/15   Encarnacion Slates, NP  budesonide (PULMICORT) 0.25  MG/2ML nebulizer solution Take 2 mLs (0.25 mg total) by nebulization 2 (two) times daily. For COPD 11/07/15   Encarnacion Slates, NP  gabapentin (NEURONTIN) 100 MG capsule Take 1 capsule (100 mg total) by mouth 3 (three) times daily. For agitation/alcohol withdrawal syndrome 11/07/15   Encarnacion Slates, NP  Ipratropium-Albuterol (COMBIVENT) 20-100 MCG/ACT AERS respimat Inhale 1 puff into the lungs every 6 (six) hours as needed for wheezing or shortness of breath. 11/07/15   Encarnacion Slates, NP  levofloxacin (LEVAQUIN) 500 MG tablet Take 1 tablet (500 mg total) by mouth daily. For Respiratory infection 11/07/15   Encarnacion Slates, NP  lidocaine (LIDODERM) 5 % Place 1 patch onto the skin daily. Remove & Discard patch within 12 hours or as  directed by MD: For pain management: 11/07/15   Encarnacion Slates, NP  metoprolol tartrate (LOPRESSOR) 25 MG tablet Take 1 tablet (25 mg total) by mouth daily before breakfast. For high blood pressure 11/07/15   Encarnacion Slates, NP  mirtazapine (REMERON) 15 MG tablet Take 1 tablet (15 mg total) by mouth at bedtime. For depression/insomnia 11/07/15   Encarnacion Slates, NP  nicotine (NICODERM CQ - DOSED IN MG/24 HOURS) 21 mg/24hr patch Place 1 patch (21 mg total) onto the skin daily. For nicotine addiction 11/07/15   Encarnacion Slates, NP  pantoprazole (PROTONIX) 40 MG tablet Take 1 tablet (40 mg total) by mouth daily. For acid reflux 11/07/15   Encarnacion Slates, NP  traZODone (DESYREL) 100 MG tablet Take 1 tablet (100 mg total) by mouth at bedtime as needed for sleep. 11/07/15   Encarnacion Slates, NP   BP 150/89 mmHg  Pulse 105  Temp(Src) 98 F (36.7 C) (Oral)  Resp 18  Ht 5\' 5"  (1.651 m)  Wt 150 lb (68.04 kg)  BMI 24.96 kg/m2  SpO2 96% Physical Exam  Constitutional: He appears well-developed and well-nourished.  intoxicated  HENT:  Head: Normocephalic and atraumatic.  Right Ear: External ear normal.  Left Ear: External ear normal.  Nose: Nose normal.  Eyes: Right eye exhibits no discharge. Left eye exhibits no discharge.  Neck: Normal range of motion. Neck supple. No spinous process tenderness and no muscular tenderness present.  Cardiovascular: Normal rate, regular rhythm, normal heart sounds and intact distal pulses.   Pulses:      Radial pulses are 2+ on the right side, and 2+ on the left side.       Dorsalis pedis pulses are 2+ on the right side, and 2+ on the left side.  Pulmonary/Chest: Effort normal and breath sounds normal. He exhibits tenderness.  Abdominal: Soft. There is tenderness in the right upper quadrant, epigastric area and left upper quadrant.  Musculoskeletal: He exhibits no edema.  Neurological:  Asleep. Arouses to minimal stimulation but appears intoxicated. Equal strength in all 4  extremities  Skin: Skin is warm and dry.  Nursing note and vitals reviewed.   ED Course  Procedures (including critical care time) Labs Review Labs Reviewed  COMPREHENSIVE METABOLIC PANEL - Abnormal; Notable for the following:    Glucose, Bld 108 (*)    BUN <5 (*)    Calcium 8.7 (*)    All other components within normal limits  CBC WITH DIFFERENTIAL/PLATELET - Abnormal; Notable for the following:    RDW 18.0 (*)    Platelets 503 (*)    All other components within normal limits  ETHANOL - Abnormal; Notable for the following:    Alcohol, Ethyl (  B) 357 (*)    All other components within normal limits  LIPASE, BLOOD  URINE RAPID DRUG SCREEN, HOSP PERFORMED  URINALYSIS, ROUTINE W REFLEX MICROSCOPIC (NOT AT [facility])  Randolm Idol, ED    Imaging Review Dg Chest 2 View  11/15/2015  CLINICAL DATA:  Chest pain today. Initial encounter. No known injury. EXAM: CHEST  2 VIEW COMPARISON:  PA and lateral chest 10/28/2015 and 12/10/2014. FINDINGS: The lungs are clear. Heart size is normal. No pneumothorax or pleural effusion. Surgical clips at the gastroesophageal junction and resection of the distal left clavicle are noted. IMPRESSION: No acute disease. Electronically Signed   By: Inge Rise M.D.   On: 11/15/2015 13:34   Ct Head Wo Contrast  11/15/2015  CLINICAL DATA:  Pain following fall EXAM: CT HEAD WITHOUT CONTRAST CT CERVICAL SPINE WITHOUT CONTRAST TECHNIQUE: Multidetector CT imaging of the head and cervical spine was performed following the standard protocol without intravenous contrast. Multiplanar CT image reconstructions of the cervical spine were also generated. COMPARISON:  CT head and CT cervical spine October 20, 2015 ; head CT June 20, 2015 FINDINGS: CT HEAD FINDINGS Mild diffuse atrophy is stable. There is no intracranial mass, hemorrhage, extra-axial fluid collection, or midline shift. There is moderate small vessel disease in the mid pons in the basilar perforator  distribution. There is mild small vessel disease in the centra semiovale bilaterally. There is no new gray-white compartment lesion. No acute infarct evident. The bony calvarium appears intact. The mastoid air cells are clear. No intraorbital lesions are evident. There is mucosal thickening in the anterior right sphenoid sinus region. CT CERVICAL SPINE FINDINGS There is no acute fracture or spondylolisthesis. The prevertebral soft tissues and predental space regions are normal. There is stable mid cervical dextroscoliosis. There is stable ankylosis at C5 and C6. There is moderately severe disc space narrowing at C3-4, C4-5, and C6-7. There is facet hypertrophy with exit foraminal narrowing at C3-4 bilaterally, at C4-5 on the right, and at C6-7 on the right. There are foci of carotid artery calcification bilaterally. There is fluid with esophageal thickening in the upper thoracic esophagus. IMPRESSION: CT head: Atrophy with mild small vessel disease. The greatest degree of small vessel disease appears in the mid pons in the basilar perforator distribution. There is no intracranial mass, hemorrhage, or extra-axial fluid collection. No acute infarct evident. Mild right sphenoid sinus disease. CT cervical spine: No acute fracture or spondylolisthesis. Multifocal arthropathy. Stable ankylosis at C5 and C6. Foci of carotid artery calcification bilaterally. Fluid in the upper cervical esophagus may be indicative of chronic reflux or could be indicative of achalasia or similar motility disorder. This finding may warrant nonemergent barium swallow or direct visualization of the esophagus to further evaluate. Electronically Signed   By: Lowella Grip III M.D.   On: 11/15/2015 13:53   Ct Cervical Spine Wo Contrast  11/15/2015  CLINICAL DATA:  Pain following fall EXAM: CT HEAD WITHOUT CONTRAST CT CERVICAL SPINE WITHOUT CONTRAST TECHNIQUE: Multidetector CT imaging of the head and cervical spine was performed following the  standard protocol without intravenous contrast. Multiplanar CT image reconstructions of the cervical spine were also generated. COMPARISON:  CT head and CT cervical spine October 20, 2015 ; head CT June 20, 2015 FINDINGS: CT HEAD FINDINGS Mild diffuse atrophy is stable. There is no intracranial mass, hemorrhage, extra-axial fluid collection, or midline shift. There is moderate small vessel disease in the mid pons in the basilar perforator distribution. There is mild small vessel  disease in the centra semiovale bilaterally. There is no new gray-white compartment lesion. No acute infarct evident. The bony calvarium appears intact. The mastoid air cells are clear. No intraorbital lesions are evident. There is mucosal thickening in the anterior right sphenoid sinus region. CT CERVICAL SPINE FINDINGS There is no acute fracture or spondylolisthesis. The prevertebral soft tissues and predental space regions are normal. There is stable mid cervical dextroscoliosis. There is stable ankylosis at C5 and C6. There is moderately severe disc space narrowing at C3-4, C4-5, and C6-7. There is facet hypertrophy with exit foraminal narrowing at C3-4 bilaterally, at C4-5 on the right, and at C6-7 on the right. There are foci of carotid artery calcification bilaterally. There is fluid with esophageal thickening in the upper thoracic esophagus. IMPRESSION: CT head: Atrophy with mild small vessel disease. The greatest degree of small vessel disease appears in the mid pons in the basilar perforator distribution. There is no intracranial mass, hemorrhage, or extra-axial fluid collection. No acute infarct evident. Mild right sphenoid sinus disease. CT cervical spine: No acute fracture or spondylolisthesis. Multifocal arthropathy. Stable ankylosis at C5 and C6. Foci of carotid artery calcification bilaterally. Fluid in the upper cervical esophagus may be indicative of chronic reflux or could be indicative of achalasia or similar motility  disorder. This finding may warrant nonemergent barium swallow or direct visualization of the esophagus to further evaluate. Electronically Signed   By: Lowella Grip III M.D.   On: 11/15/2015 13:53   I have personally reviewed and evaluated these images and lab results as part of my medical decision-making.   EKG Interpretation   Date/Time:  Tuesday November 15 2015 12:13:21 EDT Ventricular Rate:  106 PR Interval:  137 QRS Duration: 99 QT Interval:  350 QTC Calculation: 465 R Axis:   79 Text Interpretation:  Sinus tachycardia Otherwise normal ECG Confirmed by  Nosson Wender  MD, Deborah Lazcano ([id number]) on 11/15/2015 12:18:47 PM      MDM   Final diagnoses:  Alcohol intoxication, with unspecified complication (HCC)  Chest pain, unspecified chest pain type    It's unclear exactly what happened this morning. Patient is quite intoxicated currently and unable to provide significant history. He seems to indicate that chest pain only started after his friend had performed CPR after passing out. Unclear why he passed out, although I suspect this was likely related to a high volume of alcohol. ECG is unremarkable. Initial troponin negative. He is still intoxicated, plan for a second troponin and reevaluation when sober. If history is unconcerning I think he can be discharged home. I have low suspicion for PE or dissection. O2 has been normal. Care to Dr. Alvino Chapel.    Sherwood Gambler, MD 11/15/15 [phone]

## 2015-11-15 NOTE — ED Notes (Addendum)
Pt arrived to C23 via stretcher - wearing hospital gown. Monitor applied to pt. Pt noted to be alert, oriented, cooperative - watching tv. Pt answers questions appropriately. States he does not recall events PTA. Pt drinking 2nd cup of Ginger Ale w/o difficulty. Per report, 2nd IV bolus infusing. Pt states he drinks heavily daily. Dr Alvino Chapel advised after reassesses pt, may possibly be d/c'd to home. Pt's belongings left at bedside - cell phone, wallet, shirt, ID. Full liquid dinner tray ordered for pt.

## 2015-11-16 ENCOUNTER — Encounter (HOSPITAL_COMMUNITY): Payer: Self-pay | Admitting: General Practice

## 2015-11-16 DIAGNOSIS — R079 Chest pain, unspecified: Secondary | ICD-10-CM

## 2015-11-16 DIAGNOSIS — F10120 Alcohol abuse with intoxication, uncomplicated: Secondary | ICD-10-CM | POA: Diagnosis not present

## 2015-11-16 DIAGNOSIS — R1084 Generalized abdominal pain: Secondary | ICD-10-CM | POA: Diagnosis not present

## 2015-11-16 DIAGNOSIS — F329 Major depressive disorder, single episode, unspecified: Secondary | ICD-10-CM | POA: Diagnosis present

## 2015-11-16 DIAGNOSIS — Z7951 Long term (current) use of inhaled steroids: Secondary | ICD-10-CM | POA: Diagnosis not present

## 2015-11-16 DIAGNOSIS — J449 Chronic obstructive pulmonary disease, unspecified: Secondary | ICD-10-CM | POA: Diagnosis present

## 2015-11-16 DIAGNOSIS — I10 Essential (primary) hypertension: Secondary | ICD-10-CM | POA: Diagnosis present

## 2015-11-16 DIAGNOSIS — F10229 Alcohol dependence with intoxication, unspecified: Secondary | ICD-10-CM | POA: Diagnosis present

## 2015-11-16 DIAGNOSIS — R112 Nausea with vomiting, unspecified: Secondary | ICD-10-CM | POA: Diagnosis present

## 2015-11-16 DIAGNOSIS — F17210 Nicotine dependence, cigarettes, uncomplicated: Secondary | ICD-10-CM | POA: Diagnosis present

## 2015-11-16 DIAGNOSIS — R197 Diarrhea, unspecified: Secondary | ICD-10-CM

## 2015-11-16 DIAGNOSIS — K648 Other hemorrhoids: Secondary | ICD-10-CM | POA: Diagnosis present

## 2015-11-16 DIAGNOSIS — R0789 Other chest pain: Secondary | ICD-10-CM | POA: Diagnosis present

## 2015-11-16 DIAGNOSIS — R55 Syncope and collapse: Secondary | ICD-10-CM | POA: Diagnosis not present

## 2015-11-16 DIAGNOSIS — K219 Gastro-esophageal reflux disease without esophagitis: Secondary | ICD-10-CM | POA: Diagnosis present

## 2015-11-16 DIAGNOSIS — K529 Noninfective gastroenteritis and colitis, unspecified: Secondary | ICD-10-CM | POA: Diagnosis not present

## 2015-11-16 DIAGNOSIS — E860 Dehydration: Secondary | ICD-10-CM | POA: Diagnosis present

## 2015-11-16 LAB — COMPREHENSIVE METABOLIC PANEL
ALK PHOS: 62 U/L (ref 38–126)
ALT: 22 U/L (ref 17–63)
ANION GAP: 11 (ref 5–15)
AST: 21 U/L (ref 15–41)
Albumin: 2.9 g/dL — ABNORMAL LOW (ref 3.5–5.0)
BILIRUBIN TOTAL: 0.7 mg/dL (ref 0.3–1.2)
BUN: <5 mg/dL — ABNORMAL LOW (ref 6–20)
CALCIUM: 8.1 mg/dL — AB (ref 8.9–10.3)
CO2: 22 mmol/L (ref 22–32)
Chloride: 106 mmol/L (ref 101–111)
Creatinine, Ser: 0.62 mg/dL (ref 0.61–1.24)
GFR calc non Af Amer: >60 mL/min (ref >60)
GLUCOSE: 98 mg/dL (ref 65–99)
Potassium: 3.3 mmol/L — ABNORMAL LOW (ref 3.5–5.1)
Sodium: 139 mmol/L (ref 135–145)
TOTAL PROTEIN: 5.8 g/dL — AB (ref 6.5–8.1)

## 2015-11-16 LAB — CBC
HCT: 35.6 % — ABNORMAL LOW (ref 39.0–52.0)
HEMOGLOBIN: 11.7 g/dL — AB (ref 13.0–17.0)
MCH: 27.4 pg (ref 26.0–34.0)
MCHC: 32.6 g/dL (ref 30.0–36.0)
MCV: 84 fL (ref 78.0–100.0)
PLATELETS: 441 10*3/uL — AB (ref 150–400)
RBC: 4.24 MIL/uL (ref 4.22–5.81)
RDW: 17.7 % — ABNORMAL HIGH (ref 11.5–15.5)
WBC: 11.8 10*3/uL — ABNORMAL HIGH (ref 4.0–10.5)

## 2015-11-16 LAB — CBG MONITORING, ED: GLUCOSE-CAPILLARY: 102 mg/dL — AB (ref 65–99)

## 2015-11-16 LAB — PROTIME-INR
INR: 1 (ref 0.00–1.49)
Prothrombin Time: 13.4 seconds (ref 11.6–15.2)

## 2015-11-16 LAB — C DIFFICILE QUICK SCREEN W PCR REFLEX
C DIFFICLE (CDIFF) ANTIGEN: NEGATIVE
C Diff interpretation: NEGATIVE
C Diff toxin: NEGATIVE

## 2015-11-16 LAB — TROPONIN I

## 2015-11-16 MED ORDER — ALUM & MAG HYDROXIDE-SIMETH 200-200-20 MG/5ML PO SUSP
15.0000 mL | ORAL | Status: DC | PRN
  Administered 2015-11-16: 15 mL via ORAL
  Filled 2015-11-16: qty 30

## 2015-11-16 MED ORDER — ENSURE ENLIVE PO LIQD
237.0000 mL | Freq: Two times a day (BID) | ORAL | Status: DC
  Administered 2015-11-16: 237 mL via ORAL

## 2015-11-16 MED ORDER — BOOST / RESOURCE BREEZE PO LIQD: 1.0000 | Freq: Three times a day (TID) | ORAL | Status: DC

## 2015-11-16 MED ORDER — POTASSIUM CHLORIDE CRYS ER 20 MEQ PO TBCR
40.0000 meq | EXTENDED_RELEASE_TABLET | Freq: Once | ORAL | Status: AC
  Administered 2015-11-16: 40 meq via ORAL
  Filled 2015-11-16: qty 2

## 2015-11-16 MED ORDER — IPRATROPIUM-ALBUTEROL 0.5-2.5 (3) MG/3ML IN SOLN: 3.0000 mL | RESPIRATORY_TRACT | Status: DC | PRN

## 2015-11-16 NOTE — Progress Notes (Signed)
Admission note:  Arrival Method: Patient arrived on stretcher from ED accompanied by the nurse. Mental Orientation:  Alert and oriented x 4. Telemetry: He is on 6E14, ST HR 100. Assessment: See doc flow sheets. Skin: Warm, dry and intact, no open areas or any wounds noted.  Scratch marks noted on the right leg, old incision scar noted on the midline abdomen.  Assessed by two nurses Carlyn Reichert). IV: NS 100 ml/hr L AC. Pain: C/o headache and heart burn, paged doctor for heart burn. Safety Measures: Bed in low position, call light and phone within reach. Fall Prevention Safety Plan: Reviewed the plan, understood and acknowledged. Admission Screening: In progress. 6700 Orientation: Patient has been oriented to the unit, staff and to the room.

## 2015-11-16 NOTE — Progress Notes (Signed)
PROGRESS NOTE    Samuel Reyes  [id number] DOB: July 04, 1956 DOA: 11/15/2015 PCP: Lorayne Marek, MD  Outpatient Specialists:     Brief Narrative:  60 year old male with history of hypertension, alcohol abuse, COPD, GERD came that syncope chest pain nausea vomiting diarrhea and abdominal pain. Patient had syncope episode after drinking 5 40 ounce beers.Also patient has been complaining of nausea vomiting and diarrhea for about 1 week.   Assessment & Plan:   Principal Problem:   Nausea vomiting and diarrhea Active Problems:   COPD (chronic obstructive pulmonary disease) (HCC)   GERD (gastroesophageal reflux disease)   Alcohol intoxication ([place])   Depression   Alcoholism /alcohol abuse ([place])   Syncope   Chest pain   Abdominal pain   Nausea , vomiting, diarrhea- lipase was normal in the ED. Stool for C. difficile ordered and is currently pending. Will continue with Zofran for nausea and vomiting morphine when necessary for pain.  Chest pain- likely atypical musculoskeletal pain. Patient had a CPR performed by his friend. Has reproducible tenderness over the anterior chest wall. Troponin 2 negative so far. Would admit the patient for serial troponin. Continue aspirin and metoprolol.  Alcohol abuse- patient started on CIWA protocol. Continue folic acid, thiamine.  Syncope- likely from alcohol intoxication. CT head and C-spine negative for any acute abnormality. We'll obtain echocardiogram. The patient on telemetry   DVT prophylaxis: Lovenox  Code Status: Full code  Family Communication: No family at bedside  Disposition Plan: Pending completion of workup for chest pain   Consultants:  None   Procedures:  None   Antimicrobials:  None   Subjective: Patient seen and examined. Denies any complaints this morning  Objective: Filed Vitals:   11/16/15 1230 11/16/15 1300 11/16/15 1330 11/16/15 1400  BP: 147/84 163/88 151/89 137/86  Pulse: 96 103 103 93  Temp:        TempSrc:      Resp: 17 21 19 21   Height:      Weight:      SpO2: 96% 99% 100% 98%    Intake/Output Summary (Last 24 hours) at 11/16/15 1523 Last data filed at 11/16/15 1114  Gross per 24 hour  Intake     20 ml  Output    600 ml  Net   -580 ml   Filed Weights   11/15/15 1213  Weight: 68.04 kg (150 lb)    Examination:  General exam: Appears calm and comfortable  Respiratory system: Clear to auscultation. Respiratory effort normal. Cardiovascular system: S1 & S2 heard, RRR. No JVD, murmurs, rubs, gallops or clicks. No pedal edema. Gastrointestinal system: Abdomen is nondistended, soft and nontender. No organomegaly or masses felt. Normal bowel sounds heard. Central nervous system: Alert and oriented. No focal neurological deficits. Extremities: Symmetric 5 x 5 power. Skin: No rashes, lesions or ulcers Psychiatry: Judgement and insight appear normal. Mood & affect appropriate.     Data Reviewed: I have personally reviewed following labs and imaging studies  CBC:  Recent Labs Lab 11/15/15 1304 11/16/15 0204  WBC 10.1 11.8*  NEUTROABS 7.5  --   HGB 14.6 11.7*  HCT 45.1 35.6*  MCV 85.1 84.0  PLT 503* [id number]*   Basic Metabolic Panel:  Recent Labs Lab 11/15/15 1304 11/16/15 0204  NA 144 139  K 3.5 3.3*  CL 104 106  CO2 25 22  GLUCOSE 108* 98  BUN <5* <5*  CREATININE 0.81 0.62  CALCIUM 8.7* 8.1*   GFR: Estimated Creatinine Clearance: 85.4 mL/min (by  C-G formula based on Cr of 0.62). Liver Function Tests:  Recent Labs Lab 11/15/15 1304 11/16/15 0204  AST 30 21  ALT 28 22  ALKPHOS 74 62  BILITOT 0.4 0.7  PROT 7.5 5.8*  ALBUMIN 3.7 2.9*    Recent Labs Lab 11/15/15 1304  LIPASE 20   No results for input(s): AMMONIA in the last 168 hours. Coagulation Profile:  Recent Labs Lab 11/16/15 0204  INR 1.00   Cardiac Enzymes:  Recent Labs Lab 11/16/15 0204  TROPONINI <0.03   BNP (last 3 results) No results for input(s): PROBNP in the last  8760 hours. HbA1C: No results for input(s): HGBA1C in the last 72 hours. CBG:  Recent Labs Lab 11/16/15 0828  GLUCAP 102*   Lipid Profile: No results for input(s): CHOL, HDL, LDLCALC, TRIG, CHOLHDL, LDLDIRECT in the last 72 hours. Thyroid Function Tests: No results for input(s): TSH, T4TOTAL, FREET4, T3FREE, THYROIDAB in the last 72 hours. Anemia Panel: No results for input(s): VITAMINB12, FOLATE, FERRITIN, TIBC, IRON, RETICCTPCT in the last 72 hours. Urine analysis:    Component Value Date/Time   COLORURINE YELLOW 11/15/2015 1400   APPEARANCEUR CLEAR 11/15/2015 1400   LABSPEC 1.009 11/15/2015 1400   PHURINE 5.5 11/15/2015 1400   GLUCOSEU NEGATIVE 11/15/2015 1400   HGBUR NEGATIVE 11/15/2015 1400   BILIRUBINUR NEGATIVE 11/15/2015 1400   KETONESUR NEGATIVE 11/15/2015 1400   PROTEINUR NEGATIVE 11/15/2015 1400   UROBILINOGEN 0.2 05/31/2015 2156   NITRITE NEGATIVE 11/15/2015 1400   LEUKOCYTESUR NEGATIVE 11/15/2015 1400   Sepsis Labs: @LABRCNTIP (procalcitonin:4,lacticidven:4)  ) Recent Results (from the past 240 hour(s))  C difficile quick scan w PCR reflex     Status: None   Collection Time: 11/16/15  1:17 PM  Result Value Ref Range Status   C Diff antigen NEGATIVE NEGATIVE Final   C Diff toxin NEGATIVE NEGATIVE Final   C Diff interpretation Negative for toxigenic C. difficile  Final         Radiology Studies: Dg Chest 2 View  11/15/2015  CLINICAL DATA:  Chest pain today. Initial encounter. No known injury. EXAM: CHEST  2 VIEW COMPARISON:  PA and lateral chest 10/28/2015 and 12/10/2014. FINDINGS: The lungs are clear. Heart size is normal. No pneumothorax or pleural effusion. Surgical clips at the gastroesophageal junction and resection of the distal left clavicle are noted. IMPRESSION: No acute disease. Electronically Signed   By: Inge Rise M.D.   On: 11/15/2015 13:34   Ct Head Wo Contrast  11/15/2015  CLINICAL DATA:  Pain following fall EXAM: CT HEAD WITHOUT  CONTRAST CT CERVICAL SPINE WITHOUT CONTRAST TECHNIQUE: Multidetector CT imaging of the head and cervical spine was performed following the standard protocol without intravenous contrast. Multiplanar CT image reconstructions of the cervical spine were also generated. COMPARISON:  CT head and CT cervical spine October 20, 2015 ; head CT June 20, 2015 FINDINGS: CT HEAD FINDINGS Mild diffuse atrophy is stable. There is no intracranial mass, hemorrhage, extra-axial fluid collection, or midline shift. There is moderate small vessel disease in the mid pons in the basilar perforator distribution. There is mild small vessel disease in the centra semiovale bilaterally. There is no new gray-white compartment lesion. No acute infarct evident. The bony calvarium appears intact. The mastoid air cells are clear. No intraorbital lesions are evident. There is mucosal thickening in the anterior right sphenoid sinus region. CT CERVICAL SPINE FINDINGS There is no acute fracture or spondylolisthesis. The prevertebral soft tissues and predental space regions are normal. There is  stable mid cervical dextroscoliosis. There is stable ankylosis at C5 and C6. There is moderately severe disc space narrowing at C3-4, C4-5, and C6-7. There is facet hypertrophy with exit foraminal narrowing at C3-4 bilaterally, at C4-5 on the right, and at C6-7 on the right. There are foci of carotid artery calcification bilaterally. There is fluid with esophageal thickening in the upper thoracic esophagus. IMPRESSION: CT head: Atrophy with mild small vessel disease. The greatest degree of small vessel disease appears in the mid pons in the basilar perforator distribution. There is no intracranial mass, hemorrhage, or extra-axial fluid collection. No acute infarct evident. Mild right sphenoid sinus disease. CT cervical spine: No acute fracture or spondylolisthesis. Multifocal arthropathy. Stable ankylosis at C5 and C6. Foci of carotid artery calcification  bilaterally. Fluid in the upper cervical esophagus may be indicative of chronic reflux or could be indicative of achalasia or similar motility disorder. This finding may warrant nonemergent barium swallow or direct visualization of the esophagus to further evaluate. Electronically Signed   By: Lowella Grip III M.D.   On: 11/15/2015 13:53   Ct Cervical Spine Wo Contrast  11/15/2015  CLINICAL DATA:  Pain following fall EXAM: CT HEAD WITHOUT CONTRAST CT CERVICAL SPINE WITHOUT CONTRAST TECHNIQUE: Multidetector CT imaging of the head and cervical spine was performed following the standard protocol without intravenous contrast. Multiplanar CT image reconstructions of the cervical spine were also generated. COMPARISON:  CT head and CT cervical spine October 20, 2015 ; head CT June 20, 2015 FINDINGS: CT HEAD FINDINGS Mild diffuse atrophy is stable. There is no intracranial mass, hemorrhage, extra-axial fluid collection, or midline shift. There is moderate small vessel disease in the mid pons in the basilar perforator distribution. There is mild small vessel disease in the centra semiovale bilaterally. There is no new gray-white compartment lesion. No acute infarct evident. The bony calvarium appears intact. The mastoid air cells are clear. No intraorbital lesions are evident. There is mucosal thickening in the anterior right sphenoid sinus region. CT CERVICAL SPINE FINDINGS There is no acute fracture or spondylolisthesis. The prevertebral soft tissues and predental space regions are normal. There is stable mid cervical dextroscoliosis. There is stable ankylosis at C5 and C6. There is moderately severe disc space narrowing at C3-4, C4-5, and C6-7. There is facet hypertrophy with exit foraminal narrowing at C3-4 bilaterally, at C4-5 on the right, and at C6-7 on the right. There are foci of carotid artery calcification bilaterally. There is fluid with esophageal thickening in the upper thoracic esophagus. IMPRESSION:  CT head: Atrophy with mild small vessel disease. The greatest degree of small vessel disease appears in the mid pons in the basilar perforator distribution. There is no intracranial mass, hemorrhage, or extra-axial fluid collection. No acute infarct evident. Mild right sphenoid sinus disease. CT cervical spine: No acute fracture or spondylolisthesis. Multifocal arthropathy. Stable ankylosis at C5 and C6. Foci of carotid artery calcification bilaterally. Fluid in the upper cervical esophagus may be indicative of chronic reflux or could be indicative of achalasia or similar motility disorder. This finding may warrant nonemergent barium swallow or direct visualization of the esophagus to further evaluate. Electronically Signed   By: Lowella Grip III M.D.   On: 11/15/2015 13:53        Scheduled Meds: . aspirin EC  81 mg Oral Daily  . budesonide  0.25 mg Nebulization BID  . enoxaparin (LOVENOX) injection  40 mg Subcutaneous Q24H  . famotidine (PEPCID) IV  20 mg Intravenous Q12H  .  folic acid  1 mg Oral Daily  . gabapentin  100 mg Oral TID  . lidocaine  1 patch Transdermal Daily  . LORazepam  0-4 mg Intravenous Q6H   Followed by  . [START ON 11/17/2015] LORazepam  0-4 mg Intravenous Q12H  . metoprolol tartrate  25 mg Oral QAC breakfast  . mirtazapine  15 mg Oral QHS  . multivitamin with minerals  1 tablet Oral Daily  . nicotine  21 mg Transdermal Daily  . sodium chloride flush  3 mL Intravenous Q12H  . thiamine  100 mg Oral Daily   Or  . thiamine  100 mg Intravenous Daily   Continuous Infusions: . sodium chloride 1,000 mL (11/16/15 0435)        Time spent: 20 min    Nakesha Ebrahim S, MD [facility] Pager [phone]  If 7PM-7AM, please contact night-coverage [email or web address] Password [facility] 11/16/2015, 3:23 PM

## 2015-11-16 NOTE — ED Notes (Signed)
A clear liquid diet ordered for patient.

## 2015-11-16 NOTE — ED Notes (Signed)
Attempted report 

## 2015-11-16 NOTE — ED Notes (Signed)
[facility] called RN and reports they only have enough stool sample to run Cdiff scan.  GI panel will be credited back and should be reordered if adequate sample is obtained.

## 2015-11-16 NOTE — ED Notes (Addendum)
Paged Darrick Meigs MD regarding pt precautions.  MD reports that pt should only be on Enteric precautions, not airborne.

## 2015-11-17 ENCOUNTER — Inpatient Hospital Stay (HOSPITAL_COMMUNITY): Payer: Medicaid Other

## 2015-11-17 DIAGNOSIS — R1084 Generalized abdominal pain: Secondary | ICD-10-CM

## 2015-11-17 LAB — BASIC METABOLIC PANEL
Anion gap: 8 (ref 5–15)
CHLORIDE: 107 mmol/L (ref 101–111)
CO2: 27 mmol/L (ref 22–32)
Calcium: 8 mg/dL — ABNORMAL LOW (ref 8.9–10.3)
Creatinine, Ser: 0.64 mg/dL (ref 0.61–1.24)
GFR calc Af Amer: >60 mL/min (ref >60)
GFR calc non Af Amer: >60 mL/min (ref >60)
GLUCOSE: 90 mg/dL (ref 65–99)
POTASSIUM: 3.8 mmol/L (ref 3.5–5.1)
SODIUM: 142 mmol/L (ref 135–145)

## 2015-11-17 LAB — HEMOGLOBIN A1C
Hgb A1c MFr Bld: 5.9 % — ABNORMAL HIGH (ref 4.8–5.6)
MEAN PLASMA GLUCOSE: 123 mg/dL

## 2015-11-17 LAB — GLUCOSE, CAPILLARY: GLUCOSE-CAPILLARY: 89 mg/dL (ref 65–99)

## 2015-11-17 MED ORDER — THIAMINE HCL 100 MG PO TABS: 100.0000 mg | ORAL_TABLET | Freq: Every day | ORAL | Status: DC

## 2015-11-17 MED ORDER — FOLIC ACID 1 MG PO TABS: 1.0000 mg | ORAL_TABLET | Freq: Every day | ORAL | Status: DC

## 2015-11-17 NOTE — Progress Notes (Signed)
Patient for discharge, waiting for his ride to go home. Medications and discharge instructions explained to the patient. He verbalized understanding. Copies given to him. IV saline lock and tele pack removed. Notified CCMD.

## 2015-11-17 NOTE — Progress Notes (Signed)
Initial Nutrition Assessment  DOCUMENTATION CODES:   Severe malnutrition in context of acute illness/injury  INTERVENTION:  Plans for discharge today.   Recommend nutritional supplements post discharge if po intake becomes poor.   NUTRITION DIAGNOSIS:   Malnutrition related to acute illness as evidenced by percent weight loss, moderate depletion of body fat.  GOAL:   Patient will meet greater than or equal to 90% of their needs  MONITOR:   PO intake, Supplement acceptance, Weight trends, Labs, I & O's  REASON FOR ASSESSMENT:   Malnutrition Screening Tool    ASSESSMENT:   60 year old male with history of hypertension, alcohol abuse, COPD, GERD came that syncope chest pain nausea vomiting diarrhea and abdominal pain. Patient had syncope episode after drinking 5 40 ounce beers.Also patient has been complaining of nausea vomiting and diarrhea for about 1 week.  Meal completion has been 100%. Pt reports appetite is good and abdominal pains have improved. Pt reports having poor po intake at meals over the past 2 weeks with consumption of only 1 meal a day. Usual body weight reported to be ~150's. Per Epic weight records, pt with a 5.7% weight loss in 1 month. Pt currently has Ensure and Boost Breeze ordered and has been refusing them as they have been causing abdominal pains. RD to discontinue. Recommend nutritional supplements post discharge if po intake becomes poor. Plans for discharge today.   Nutrition-Focused physical exam completed. Findings are no fat depletion, moderate muscle depletion, and no edema.   Labs and medications reviewed.   Diet Order:  DIET SOFT Room service appropriate?: Yes; Fluid consistency:: Thin Diet - low sodium heart healthy  Skin:  Reviewed, no issues  Last BM:  4/19  Height:   Ht Readings from Last 1 Encounters:  11/15/15 5\' 5"  (1.651 m)    Weight:   Wt Readings from Last 1 Encounters:  11/16/15 146 lb (66.225 kg)    Ideal Body  Weight:  61.8 kg  BMI:  Body mass index is 24.3 kg/(m^2).  Estimated Nutritional Needs:   Kcal:  1850-2050  Protein:  80-90 grams  Fluid:  1.8 - 2 L/day  EDUCATION NEEDS:   No education needs identified at this time  Corrin Parker, MS, RD, LDN Pager # [phone] After hours/ weekend pager # [phone]

## 2015-11-17 NOTE — Progress Notes (Signed)
Occupational Therapy Evaluation Patient Details Name: Samuel Reyes MRN: [id number] DOB: 03/18/1956 Today's Date: 11/17/2015    History of Present Illness 60 y.o. male with medical history significant of tobacco abuse, alcohol abuse, hypertension, COPD, GERD, depression, who presents with syncope, chest pain, nausea, vomiting, diarrhea and abdominal pain presents with syncope, chest pain, nausea, vomiting, diarrhea and abdominal pain.   Clinical Impression   Pt admitted with the above diagnoses and presents with below problem list. Pt will benefit from continued acute OT to address the below listed deficits and maximize independence with BADLs. PTA pt was mod I with ADLs; ambulated about 2 miles a day often on uneven terrain (homeless). Pt appears to be close to baseline with ADLs, supervision level for OOB ADLs. Ambulated household distance with decreased speed and wide BOS noted, no external support utilized. Discussed case with SW.      Follow Up Recommendations  No OT follow up    Equipment Recommendations  None recommended by OT    Recommendations for Other Services PT consult     Precautions / Restrictions Restrictions Weight Bearing Restrictions: No      Mobility Bed Mobility               General bed mobility comments: in recliner  Transfers Overall transfer level: Needs assistance Equipment used: None Transfers: Sit to/from Stand Sit to Stand: Supervision         General transfer comment: supervision for safety; from recliner and toilet    Balance Overall balance assessment: Needs assistance Sitting-balance support: Feet supported;No upper extremity supported Sitting balance-Leahy Scale: Good     Standing balance support: No upper extremity supported;During functional activity Standing balance-Leahy Scale: Fair Standing balance comment: stood to wash hands with no external support. Wide BOS noted.                             ADL  Overall ADL's : Needs assistance/impaired Eating/Feeding: Set up;Sitting   Grooming: Wash/dry hands;Supervision/safety;Standing   Upper Body Bathing: Supervision/ safety;Sitting   Lower Body Bathing: Supervison/ safety;Sit to/from stand   Upper Body Dressing : Supervision/safety;Sitting   Lower Body Dressing: Supervision/safety;Sit to/from stand   Toilet Transfer: Supervision/safety;Regular Toilet;Grab bars   Toileting- Water quality scientist and Hygiene: Supervision/safety;Sitting/lateral lean;Sit to/from stand   Scientist, research (medical): Supervision/safety   Functional mobility during ADLs: Min guard General ADL Comments: Min guard for safety with pt ambulating household distance in hallway. Pt did not utilize external support. No LOB. Decreased speed noted and pt cautious. Pt reports this is close to/at baseline for him though he is typically walking about 2 miles a day. Introduced some energy conservation and fall prevention education. Pt able to access feet in seated position.      Vision     Perception     Praxis      Pertinent Vitals/Pain Pain Assessment: 0-10 Pain Score: 5  Pain Location: headache Pain Descriptors / Indicators: Aching Pain Intervention(s): Monitored during session;Premedicated before session     Hand Dominance     Extremity/Trunk Assessment Upper Extremity Assessment Upper Extremity Assessment: Overall WFL for tasks assessed;Generalized weakness   Lower Extremity Assessment Lower Extremity Assessment: Defer to PT evaluation       Communication Communication Communication: No difficulties   Cognition Arousal/Alertness: Awake/alert Behavior During Therapy: WFL for tasks assessed/performed Overall Cognitive Status: Within Functional Limits for tasks assessed  General Comments       Exercises       Shoulder Instructions      Home Living Family/patient expects to be discharged to:: Shelter/Homeless                                  Additional Comments: Pt reports he is homeless and walks about 2 miles a day often on uneven terrain to find places to camp for the night. Typically carries a pack with him as well. Pt reports sister is bringing him some clothes to the hospital.       Prior Functioning/Environment Level of Independence: Independent        Comments: Sounds like he was mod I due to decreased speed of functinoal mobility and reports he uses "whatever he can find" at times to use as makeshift cane.    OT Diagnosis: Generalized weakness   OT Problem List: Decreased strength;Impaired balance (sitting and/or standing);Decreased knowledge of use of DME or AE;Decreased knowledge of precautions   OT Treatment/Interventions: Self-care/ADL training;Therapeutic exercise;Energy conservation;DME and/or AE instruction;Therapeutic activities;Patient/family education;Balance training    OT Goals(Current goals can be found in the care plan section) Acute Rehab OT Goals Patient Stated Goal: not stated OT Goal Formulation: With patient Time For Goal Achievement: 12/01/15 Potential to Achieve Goals: Good ADL Goals Pt Will Perform Grooming: with modified independence;standing (including gathering items) Pt Will Perform Lower Body Bathing: with modified independence;sit to/from stand Pt Will Perform Lower Body Dressing: with modified independence;sit to/from stand Pt Will Transfer to Toilet: with modified independence;ambulating  OT Frequency: Min 2X/week   Barriers to D/C: Other (comment) (homeless)          Co-evaluation              End of Session Equipment Utilized During Treatment: Gait belt  Activity Tolerance: Patient tolerated treatment well Patient left: in chair;with call bell/phone within reach   Time: 1112-1126 OT Time Calculation (min): 14 min Charges:  OT General Charges $OT Visit: 1 Procedure OT Evaluation $OT Eval Low Complexity: 1 Procedure G-Codes:     Hortencia Pilar 2015-12-14, 11:58 AM

## 2015-11-17 NOTE — Care Management Note (Signed)
Case Management Note  Patient Details  Name: Samuel Reyes MRN: [id number] Date of Birth: [birth year]  Subjective/Objective:        CM following for progression and d/c planning.            Action/Plan: 11/17/2015 Per PT eval pt able to walk unassisted, plans to return to "camp" site as pt is homeless . CSW will provide bus pass to assist pt at time of d/c.   Expected Discharge Date:     11/17/2015             Expected Discharge Plan:  Home/Self Care  In-House Referral:  Clinical Social Work  Discharge planning Services  CM Consult  Post Acute Care Choice:  NA Choice offered to:  NA  DME Arranged:   NA DME Agency:   NA  HH Arranged:   NA HH Agency:   NA  Status of Service:  Completed, signed off  Medicare Important Message Given:    Date Medicare IM Given:    Medicare IM give by:    Date Additional Medicare IM Given:    Additional Medicare Important Message give by:     If discussed at [place] of Stay Meetings, dates discussed:    Additional Comments:  Adron Bene, RN 11/17/2015, 11:35 AM

## 2015-11-17 NOTE — Progress Notes (Signed)
Spoke to Dr. Darrick Meigs and he wants me to notify CV to cancel ECHO. Called and left a message at CV department.
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
## 2015-11-17 NOTE — Evaluation (Signed)
Physical Therapy Evaluation Patient Details Name: Samuel Reyes MRN: [id number] DOB: 06-Aug-1955 Today's Date: 11/17/2015   History of Present Illness  60 y.o. male with medical history significant of tobacco abuse, alcohol abuse, hypertension, COPD, GERD, depression, who presents with syncope, chest pain, nausea, vomiting, diarrhea and abdominal pain.    Clinical Impression  Patient presents with antalgic gait due to chronic Rt knee pain.  Patient has had multiple falls pta.  Recommend cane for use at d/c to increase stability and safety with gait.  Patient at baseline functional level - PT will sign off.    Follow Up Recommendations No PT follow up;Supervision - Intermittent    Equipment Recommendations  Cane    Recommendations for Other Services       Precautions / Restrictions Precautions Precautions: Fall Precaution Comments: Mult falls pta - Rt knee "gives away" Restrictions Weight Bearing Restrictions: No      Mobility  Bed Mobility Overal bed mobility: Independent             General bed mobility comments: in recliner  Transfers Overall transfer level: Modified independent Equipment used: None Transfers: Sit to/from Stand Sit to Stand: Supervision         General transfer comment: Supervision for safety.  Ambulation/Gait Ambulation/Gait assistance: Supervision Ambulation Distance (Feet): 160 Feet Assistive device: None (Pushing IV pole 1/2 distance) Gait Pattern/deviations: Step-to pattern;Decreased stance time - right;Decreased step length - left;Decreased step length - right;Decreased stride length;Decreased weight shift to right;Antalgic Gait velocity: decreased Gait velocity interpretation: Below normal speed for age/gender General Gait Details: Patient with step-to gait pattern, short step lengths, and antalgic gait due to pain in Rt knee.  More steady when pushing IV pole.  Unsteady gait with no assistive device.  Stairs             Wheelchair Mobility    Modified Rankin (Stroke Patients Only)       Balance Overall balance assessment: Needs assistance Sitting-balance support: Feet supported;No upper extremity supported Sitting balance-Leahy Scale: Good     Standing balance support: No upper extremity supported Standing balance-Leahy Scale: Fair Standing balance comment: stood to wash hands with no external support. Wide BOS noted.                              Pertinent Vitals/Pain Pain Assessment: 0-10 Pain Score: 4  Pain Location: Rt knee and back Pain Descriptors / Indicators: Sore Pain Intervention(s): Monitored during session;Repositioned    Home Living Family/patient expects to be discharged to:: Shelter/Homeless                 Additional Comments: Pt reports he is homeless and walks about 2 miles a day often on uneven terrain to find places to camp for the night. Typically carries a pack with him as well. Pt reports sister is bringing him some clothes to the hospital.     Prior Function Level of Independence: Independent         Comments: Sounds like he was mod I due to decreased speed of functinoal mobility and reports he uses "whatever he can find" at times to use as makeshift cane.     Hand Dominance        Extremity/Trunk Assessment   Upper Extremity Assessment: Defer to OT evaluation           Lower Extremity Assessment: Overall WFL for tasks assessed;RLE deficits/detail RLE Deficits / Details: Painful knee limiting  mobility       Communication   Communication: No difficulties  Cognition Arousal/Alertness: Awake/alert Behavior During Therapy: WFL for tasks assessed/performed Overall Cognitive Status: Within Functional Limits for tasks assessed                      General Comments      Exercises        Assessment/Plan    PT Assessment Patent does not need any further PT services  PT Diagnosis Abnormality of gait;Acute pain    PT Problem List    PT Treatment Interventions     PT Goals (Current goals can be found in the Care Plan section) Acute Rehab PT Goals Patient Stated Goal: not stated PT Goal Formulation: All assessment and education complete, DC therapy    Frequency     Barriers to discharge        Co-evaluation               End of Session Equipment Utilized During Treatment: Gait belt Activity Tolerance: Patient tolerated treatment well;Patient limited by pain Patient left: in bed;with call bell/phone within reach Nurse Communication: Mobility status         Time: [id number] PT Time Calculation (min) (ACUTE ONLY): 10 min   Charges:   PT Evaluation $PT Eval Moderate Complexity: 1 Procedure     PT G CodesDespina Pole 12-04-2015, 12:54 PM Carita Pian. Sanjuana Kava, [place] Pager [phone]

## 2015-12-11 ENCOUNTER — Emergency Department (HOSPITAL_COMMUNITY): Payer: Medicaid Other

## 2015-12-11 ENCOUNTER — Encounter (HOSPITAL_COMMUNITY): Payer: Self-pay

## 2015-12-11 ENCOUNTER — Emergency Department (HOSPITAL_COMMUNITY)
Admission: EM | Admit: 2015-12-11 | Discharge: 2015-12-12 | Disposition: A | Discharge status: Other Acute Inpatient Hospital | Payer: Medicaid Other | Attending: Emergency Medicine | Admitting: Emergency Medicine

## 2015-12-11 DIAGNOSIS — F17210 Nicotine dependence, cigarettes, uncomplicated: Secondary | ICD-10-CM | POA: Insufficient documentation

## 2015-12-11 DIAGNOSIS — Z7951 Long term (current) use of inhaled steroids: Secondary | ICD-10-CM | POA: Diagnosis not present

## 2015-12-11 DIAGNOSIS — R0789 Other chest pain: Secondary | ICD-10-CM | POA: Diagnosis not present

## 2015-12-11 DIAGNOSIS — F10120 Alcohol abuse with intoxication, uncomplicated: Secondary | ICD-10-CM | POA: Insufficient documentation

## 2015-12-11 DIAGNOSIS — K219 Gastro-esophageal reflux disease without esophagitis: Secondary | ICD-10-CM | POA: Diagnosis not present

## 2015-12-11 DIAGNOSIS — R45851 Suicidal ideations: Secondary | ICD-10-CM

## 2015-12-11 DIAGNOSIS — R079 Chest pain, unspecified: Secondary | ICD-10-CM

## 2015-12-11 DIAGNOSIS — F329 Major depressive disorder, single episode, unspecified: Secondary | ICD-10-CM | POA: Insufficient documentation

## 2015-12-11 DIAGNOSIS — F1094 Alcohol use, unspecified with alcohol-induced mood disorder: Secondary | ICD-10-CM

## 2015-12-11 DIAGNOSIS — Z590 Homelessness: Secondary | ICD-10-CM | POA: Diagnosis not present

## 2015-12-11 DIAGNOSIS — J449 Chronic obstructive pulmonary disease, unspecified: Secondary | ICD-10-CM | POA: Insufficient documentation

## 2015-12-11 DIAGNOSIS — Z79899 Other long term (current) drug therapy: Secondary | ICD-10-CM | POA: Diagnosis not present

## 2015-12-11 DIAGNOSIS — F1020 Alcohol dependence, uncomplicated: Secondary | ICD-10-CM

## 2015-12-11 DIAGNOSIS — F331 Major depressive disorder, recurrent, moderate: Secondary | ICD-10-CM | POA: Diagnosis present

## 2015-12-11 LAB — CBC
HCT: 43.6 % (ref 39.0–52.0)
HEMOGLOBIN: 14.5 g/dL (ref 13.0–17.0)
MCH: 27.7 pg (ref 26.0–34.0)
MCHC: 33.3 g/dL (ref 30.0–36.0)
MCV: 83.4 fL (ref 78.0–100.0)
PLATELETS: 317 10*3/uL (ref 150–400)
RBC: 5.23 MIL/uL (ref 4.22–5.81)
RDW: 17.9 % — AB (ref 11.5–15.5)
WBC: 12.4 10*3/uL — ABNORMAL HIGH (ref 4.0–10.5)

## 2015-12-11 LAB — COMPREHENSIVE METABOLIC PANEL
ALK PHOS: 111 U/L (ref 38–126)
ALT: 25 U/L (ref 17–63)
ANION GAP: 13 (ref 5–15)
AST: 36 U/L (ref 15–41)
Albumin: 3.6 g/dL (ref 3.5–5.0)
BUN: 12 mg/dL (ref 6–20)
CALCIUM: 8.1 mg/dL — AB (ref 8.9–10.3)
CO2: 21 mmol/L — ABNORMAL LOW (ref 22–32)
CREATININE: 0.75 mg/dL (ref 0.61–1.24)
Chloride: 105 mmol/L (ref 101–111)
Glucose, Bld: 92 mg/dL (ref 65–99)
Potassium: 4.1 mmol/L (ref 3.5–5.1)
SODIUM: 139 mmol/L (ref 135–145)
TOTAL PROTEIN: 7.5 g/dL (ref 6.5–8.1)
Total Bilirubin: 0.9 mg/dL (ref 0.3–1.2)

## 2015-12-11 LAB — SALICYLATE LEVEL

## 2015-12-11 LAB — TROPONIN I

## 2015-12-11 LAB — ACETAMINOPHEN LEVEL

## 2015-12-11 LAB — ETHANOL: ALCOHOL ETHYL (B): 231 mg/dL — AB (ref <5)

## 2015-12-11 MED ORDER — ALUM & MAG HYDROXIDE-SIMETH 200-200-20 MG/5ML PO SUSP
30.0000 mL | ORAL | Status: DC | PRN
  Administered 2015-12-11: 30 mL via ORAL
  Filled 2015-12-11: qty 30

## 2015-12-11 MED ORDER — IBUPROFEN 200 MG PO TABS
600.0000 mg | ORAL_TABLET | Freq: Three times a day (TID) | ORAL | Status: DC | PRN
  Administered 2015-12-12: 600 mg via ORAL
  Filled 2015-12-11: qty 3

## 2015-12-11 MED ORDER — ACETAMINOPHEN 325 MG PO TABS
650.0000 mg | ORAL_TABLET | ORAL | Status: DC | PRN
  Administered 2015-12-11: 650 mg via ORAL
  Filled 2015-12-11: qty 2

## 2015-12-11 MED ORDER — METOPROLOL TARTRATE 25 MG PO TABS
25.0000 mg | ORAL_TABLET | Freq: Every day | ORAL | Status: DC
  Administered 2015-12-12: 25 mg via ORAL
  Filled 2015-12-11: qty 1

## 2015-12-11 MED ORDER — NICOTINE 21 MG/24HR TD PT24
21.0000 mg | MEDICATED_PATCH | Freq: Every day | TRANSDERMAL | Status: DC
  Administered 2015-12-11: 21 mg via TRANSDERMAL
  Filled 2015-12-11: qty 1

## 2015-12-11 MED ORDER — IPRATROPIUM-ALBUTEROL 20-100 MCG/ACT IN AERS
1.0000 | INHALATION_SPRAY | Freq: Four times a day (QID) | RESPIRATORY_TRACT | Status: DC | PRN
  Filled 2015-12-11: qty 4

## 2015-12-11 MED ORDER — THIAMINE HCL 100 MG/ML IJ SOLN: 100.0000 mg | Freq: Every day | INTRAMUSCULAR | Status: DC

## 2015-12-11 MED ORDER — TRAZODONE HCL 100 MG PO TABS: 100.0000 mg | ORAL_TABLET | Freq: Every evening | ORAL | Status: DC | PRN

## 2015-12-11 MED ORDER — LORAZEPAM 1 MG PO TABS
0.0000 mg | ORAL_TABLET | Freq: Four times a day (QID) | ORAL | Status: DC
  Administered 2015-12-11 – 2015-12-12 (×3): 1 mg via ORAL
  Administered 2015-12-12: 2 mg via ORAL
  Filled 2015-12-11 (×2): qty 1
  Filled 2015-12-11 (×2): qty 2

## 2015-12-11 MED ORDER — ONDANSETRON HCL 4 MG PO TABS: 4.0000 mg | ORAL_TABLET | Freq: Three times a day (TID) | ORAL | Status: DC | PRN

## 2015-12-11 MED ORDER — LORAZEPAM 1 MG PO TABS: 1.0000 mg | ORAL_TABLET | Freq: Three times a day (TID) | ORAL | Status: DC | PRN

## 2015-12-11 MED ORDER — BUPROPION HCL ER (SR) 100 MG PO TB12
200.0000 mg | ORAL_TABLET | Freq: Every day | ORAL | Status: DC
  Administered 2015-12-11: 200 mg via ORAL
  Filled 2015-12-11 (×2): qty 2

## 2015-12-11 MED ORDER — ZOLPIDEM TARTRATE 5 MG PO TABS
5.0000 mg | ORAL_TABLET | Freq: Every evening | ORAL | Status: DC | PRN
  Administered 2015-12-11: 5 mg via ORAL
  Filled 2015-12-11: qty 1

## 2015-12-11 MED ORDER — VITAMIN B-1 100 MG PO TABS
100.0000 mg | ORAL_TABLET | Freq: Every day | ORAL | Status: DC
  Administered 2015-12-11 – 2015-12-12 (×2): 100 mg via ORAL
  Filled 2015-12-11 (×2): qty 1

## 2015-12-11 MED ORDER — FOLIC ACID 1 MG PO TABS
1.0000 mg | ORAL_TABLET | Freq: Every day | ORAL | Status: DC
  Administered 2015-12-11 – 2015-12-12 (×2): 1 mg via ORAL
  Filled 2015-12-11 (×2): qty 1

## 2015-12-11 MED ORDER — NICOTINE 21 MG/24HR TD PT24
21.0000 mg | MEDICATED_PATCH | Freq: Every day | TRANSDERMAL | Status: DC
  Administered 2015-12-12: 21 mg via TRANSDERMAL
  Filled 2015-12-11: qty 1

## 2015-12-11 MED ORDER — ALBUTEROL SULFATE HFA 108 (90 BASE) MCG/ACT IN AERS: 1.0000 | INHALATION_SPRAY | RESPIRATORY_TRACT | Status: DC | PRN

## 2015-12-11 MED ORDER — GABAPENTIN 100 MG PO CAPS
100.0000 mg | ORAL_CAPSULE | Freq: Three times a day (TID) | ORAL | Status: DC
  Administered 2015-12-11 – 2015-12-12 (×3): 100 mg via ORAL
  Filled 2015-12-11 (×3): qty 1

## 2015-12-11 MED ORDER — LORAZEPAM 1 MG PO TABS: 0.0000 mg | ORAL_TABLET | Freq: Two times a day (BID) | ORAL | Status: DC

## 2015-12-11 MED ORDER — VITAMIN B-1 100 MG PO TABS: 100.0000 mg | ORAL_TABLET | Freq: Every day | ORAL | Status: DC

## 2015-12-11 NOTE — ED Provider Notes (Signed)
CSN: [id number]     Arrival date & time 12/11/15  1412 History   First MD Initiated Contact with Patient 12/11/15 1503     Chief Complaint  Patient presents with  . alcohol detox   . Suicidal     (Consider location/radiation/quality/duration/timing/severity/associated sxs/prior Treatment) The history is provided by the patient and medical records.    60 y.o. M with hx of GERD, depression, headaches, COPD, presenting to the ED for suicidal ideation.  Patient states he has felt suicidal for "a while".  He states his wife passed away 3 years ago and 2 of his children have died and he just feels he has been in a downward spiral.  He states he is currently homeless, has been living at shelters, motels, in the woods, etc for months now.  He states he does not have any resources to get help.  He states he feels "tired of living" and doesn't want to go on any more.  He states he has tried slitting his wrist in the past, no recent self mutilation.  States he has tried to "drink himself to death".  Denies homicidal ideation and hallucinations.  States he drinks 7 or 8 40 oz beers daily, last drink was earlier this morning.  Denies illicit drug use.  States he would like help with his suicidal thoughts as well as his drinking.  Patient also reports some burning chest pain in the center of his chest.  Has been ongoing all day today.  He states he has a known hiatal hernia and this sometimes causes him problems when he drinks.  Denies SOB, palpitations, dizziness, weakness, diaphoresis, nausea, or vomiting.  No known cardiac hx.  Patient is a daily smoker.    Past Medical History  Diagnosis Date  . COPD (chronic obstructive pulmonary disease) ([place])   . GERD (gastroesophageal reflux disease)   . Depression   . Headache    Past Surgical History  Procedure Laterality Date  . Gastrectomy    . Shoulder surgery Bilateral     3 surgeries on on left, 2 surgeries on right   . Rt knee arthroscopic surgery    .  Back surgery      3 cervical spine surgeries C4-C5 fused  . Hernia repair    . Finger surgery Left     2nd, 3rd, & 4th fingers were cut off by table saw and reattached  . Colonoscopy N/A 01/04/2014    Procedure: COLONOSCOPY;  Surgeon: Danie Binder, MD;  [facility];  Service: Endoscopy;  Laterality: N/A;  1:45  . Esophagogastroduodenoscopy N/A 01/04/2014    Procedure: ESOPHAGOGASTRODUODENOSCOPY (EGD);  Surgeon: Danie Binder, MD;  [facility];  Service: Endoscopy;  Laterality: N/A;  . Incisional hernia repair N/A 01/20/2014    Procedure: LAPAROSCOPIC RECURRENT  INCISIONAL HERNIA with mesh;  Surgeon: Edward Jolly, MD;  Location: [facility];  Service: General;  Laterality: N/A;   Family History  Problem Relation Age of Onset  . Cancer Father     bone  . Cancer Brother     lungs  . Stroke Maternal Grandmother   . Colon cancer Neg Hx   . Asthma Son     died at age 46 in his sleep   . Spina bifida Son     died at age 10   . Dementia Mother    Social History  Substance Use Topics  . Smoking status: Current Every Day Smoker -- 1.00 packs/day for  45 years    Types: Cigarettes    Start date: 07/30/1966  . Smokeless tobacco: Never Used  . Alcohol Use: Yes     Comment: 'tries to drink every day' ETOH abuse    Review of Systems  Cardiovascular: Positive for chest pain.  Psychiatric/Behavioral: Positive for suicidal ideas.  All other systems reviewed and are negative.     Allergies  Bee venom and Penicillins  Home Medications   Prior to Admission medications   Medication Sig Start Date End Date Taking? Authorizing Provider  albuterol (PROVENTIL HFA;VENTOLIN HFA) 108 (90 Base) MCG/ACT inhaler Inhale 1-2 puffs into the lungs every 4 (four) hours as needed for wheezing. 11/07/15   Encarnacion Slates, NP  budesonide (PULMICORT) 0.25 MG/2ML nebulizer solution Take 2 mLs (0.25 mg total) by nebulization 2 (two) times daily. For COPD Patient not taking: Reported on  11/17/2015 11/07/15   Encarnacion Slates, NP  buPROPion [facility] SR) 200 MG 12 hr tablet Take 200 mg by mouth at bedtime.    Historical Provider, MD  folic acid (FOLVITE) 1 MG tablet Take 1 tablet (1 mg total) by mouth daily. 11/17/15   Oswald Hillock, MD  gabapentin (NEURONTIN) 100 MG capsule Take 1 capsule (100 mg total) by mouth 3 (three) times daily. For agitation/alcohol withdrawal syndrome 11/07/15   Encarnacion Slates, NP  Ipratropium-Albuterol (COMBIVENT) 20-100 MCG/ACT AERS respimat Inhale 1 puff into the lungs every 6 (six) hours as needed for wheezing or shortness of breath. 11/07/15   Encarnacion Slates, NP  lidocaine (LIDODERM) 5 % Place 1 patch onto the skin daily. Remove & Discard patch within 12 hours or as directed by MD: For pain management: Patient not taking: Reported on 11/17/2015 11/07/15   Encarnacion Slates, NP  metoprolol tartrate (LOPRESSOR) 25 MG tablet Take 1 tablet (25 mg total) by mouth daily before breakfast. For high blood pressure 11/07/15   Encarnacion Slates, NP  mirtazapine (REMERON) 15 MG tablet Take 1 tablet (15 mg total) by mouth at bedtime. For depression/insomnia Patient not taking: Reported on 11/17/2015 11/07/15   Encarnacion Slates, NP  nicotine (NICODERM CQ - DOSED IN MG/24 HOURS) 21 mg/24hr patch Place 1 patch (21 mg total) onto the skin daily. For nicotine addiction 11/07/15   Encarnacion Slates, NP  pantoprazole (PROTONIX) 40 MG tablet Take 1 tablet (40 mg total) by mouth daily. For acid reflux Patient not taking: Reported on 11/17/2015 11/07/15   Encarnacion Slates, NP  thiamine 100 MG tablet Take 1 tablet (100 mg total) by mouth daily. 11/17/15   Oswald Hillock, MD  traZODone (DESYREL) 100 MG tablet Take 1 tablet (100 mg total) by mouth at bedtime as needed for sleep. 11/07/15   Encarnacion Slates, NP   BP 125/93 mmHg  Pulse 140  Temp(Src) 98.2 F (36.8 C) (Oral)  Ht 5\' 4"  (1.626 m)  Wt 68.04 kg  BMI 25.73 kg/m2  SpO2 98%   Physical Exam  Constitutional: He is oriented to person, place, and time. He  appears well-developed and well-nourished. No distress.  Dirty, disheveled in appearance, smells of alcohol  HENT:  Head: Normocephalic and atraumatic.  Mouth/Throat: Oropharynx is clear and moist.  Eyes: Conjunctivae and EOM are normal. Pupils are equal, round, and reactive to light.  Neck: Normal range of motion. Neck supple.  Cardiovascular: Normal rate, regular rhythm and normal heart sounds.   Pulmonary/Chest: Effort normal and breath sounds normal. No respiratory distress. He has no wheezes.  Abdominal: Soft. Bowel sounds are normal. There is no tenderness. There is no guarding.  Musculoskeletal: Normal range of motion.  Neurological: He is alert and oriented to person, place, and time.  Skin: Skin is warm and dry. He is not diaphoretic.  Psychiatric: He has a normal mood and affect. He is not actively hallucinating. He expresses suicidal ideation. He expresses no homicidal ideation. He expresses suicidal plans. He expresses no homicidal plans.  Nursing note and vitals reviewed.   ED Course  Procedures (including critical care time) Labs Review Labs Reviewed  COMPREHENSIVE METABOLIC PANEL - Abnormal; Notable for the following:    CO2 21 (*)    Calcium 8.1 (*)    All other components within normal limits  ETHANOL - Abnormal; Notable for the following:    Alcohol, Ethyl (B) 231 (*)    All other components within normal limits  ACETAMINOPHEN LEVEL - Abnormal; Notable for the following:    Acetaminophen (Tylenol), Serum <10 (*)    All other components within normal limits  CBC - Abnormal; Notable for the following:    WBC 12.4 (*)    RDW 17.9 (*)    All other components within normal limits  SALICYLATE LEVEL  TROPONIN I  URINE RAPID DRUG SCREEN, HOSP PERFORMED    Imaging Review Dg Chest 2 View  12/11/2015  CLINICAL DATA:  Chest pain, 2-3 days duration. EXAM: CHEST  2 VIEW COMPARISON:  11/15/2015 FINDINGS: Artifact overlies the chest. Heart size is normal. Mediastinal  shadows are normal. The vascularity is normal. The lungs are clear. No effusions. No bony abnormality. IMPRESSION: No active cardiopulmonary disease. Electronically Signed   By: Nelson Chimes M.D.   On: 12/11/2015 15:44   I have personally reviewed and evaluated these images and lab results as part of my medical decision-making.   EKG Interpretation None      MDM   Final diagnoses:  Suicidal ideation  Chest pain, unspecified chest pain type   60 year old male here with suicidal ideation. Reports he plans to "drink himself to death". He reports multiple family members have died of the past several years and he now feels that he is "tired of living".  Patient denies any homicidal ideation. No hallucinations. He also reports a burning chest pain in center of his chest which is been ongoing all day today. He does have history of GERD and admits to having a hiatal hernia which sometimes "acts up" when he drinks too much. He was initially tachycardic on arrival to ED at 140. This improved to 118 without any intervention. His EKG is sinus tachycardia without any acute ischemia. Troponin is negative. Chest x-ray is clear. Patient without any known cardiac history. No history of or risk factors for DVT/PE.  Feel this is more likely GERD related with his recent EtOH use.  Labs reviewed, trop negative.  Ethanol 231. Patient medically cleared.  Placed on CIWA protocol, home meds ordered.  TTS evaluation pending.    TTS has evaluated patient, recommends IP admission.  Will seek placement.  Of note, patient's initial tachycardia at 140 has completely resolved, now WNL at 74.  Larene Pickett, PA-C 12/12/15 0009  Veryl Speak, MD 12/12/15 0700

## 2015-12-11 NOTE — ED Notes (Signed)
Patient was brought in by [employer]. Patient called 911. Patient states he does not remember calling. Patient states he is suicidal. "I'm just tired of living." patient states, "I drink toomuch." patient states he last drank at 0900 and states he drank 4 40 ounce beers. Patient deneis any drug use. Patient denies Hi, visual or auditory hallucinations.

## 2015-12-11 NOTE — BH Assessment (Signed)
Assessment Note  Author Slaven is an 60 y.o. male. Patient was brought into the ED because of suicidal ideations and drug use.  Patient continues to endorse suicidal ideation with a plan to drink himself to death.  Patient report the loss of his family contributes to current symptoms.  Patient has multiple inpatient hospitalization for depression and alcoholism.    This Probation officer consulted with Ludger Nutting, NP it is recommended to refer for inpatient hospitalization.    Diagnosis: Major Depressive Disorder, recurrent, severe; Alcohol use, severe  Past Medical History:  Past Medical History  Diagnosis Date  . COPD (chronic obstructive pulmonary disease) ([place])   . GERD (gastroesophageal reflux disease)   . Depression   . Headache     Past Surgical History  Procedure Laterality Date  . Gastrectomy    . Shoulder surgery Bilateral     3 surgeries on on left, 2 surgeries on right   . Rt knee arthroscopic surgery    . Back surgery      3 cervical spine surgeries C4-C5 fused  . Hernia repair    . Finger surgery Left     2nd, 3rd, & 4th fingers were cut off by table saw and reattached  . Colonoscopy N/A 01/04/2014    Procedure: COLONOSCOPY;  Surgeon: Danie Binder, MD;  [facility];  Service: Endoscopy;  Laterality: N/A;  1:45  . Esophagogastroduodenoscopy N/A 01/04/2014    Procedure: ESOPHAGOGASTRODUODENOSCOPY (EGD);  Surgeon: Danie Binder, MD;  [facility];  Service: Endoscopy;  Laterality: N/A;  . Incisional hernia repair N/A 01/20/2014    Procedure: LAPAROSCOPIC RECURRENT  INCISIONAL HERNIA with mesh;  Surgeon: Edward Jolly, MD;  Location: [facility];  Service: General;  Laterality: N/A;    Family History:  Family History  Problem Relation Age of Onset  . Cancer Father     bone  . Cancer Brother     lungs  . Stroke Maternal Grandmother   . Colon cancer Neg Hx   . Asthma Son     died at age 54 in his sleep   . Spina bifida Son     died at age 22   .  Dementia Mother     Social History:  reports that he has been smoking Cigarettes.  He started smoking about 49 years ago. He has a 45 pack-year smoking history. He has never used smokeless tobacco. He reports that he drinks alcohol. He reports that he does not use illicit drugs.  Additional Social History:  Alcohol / Drug Use Pain Medications: see chart Prescriptions: see chart Over the Counter: see chart History of alcohol / drug use?: Yes Substance #1 Name of Substance 1: Alcohol 1 - Age of First Use: 9 1 - Amount (size/oz): 4 or more  1 - Frequency: daily 1 - Duration: ongoing 1 - Last Use / Amount: today  CIWA: CIWA-Ar BP: 141/90 mmHg Pulse Rate: (!) 125 Nausea and Vomiting: 2 Tactile Disturbances: very mild itching, pins and needles, burning or numbness Tremor: two Auditory Disturbances: not present Paroxysmal Sweats: no sweat visible Visual Disturbances: not present Anxiety: two Headache, Fullness in Head: very mild Agitation: normal activity Orientation and Clouding of Sensorium: cannot do serial additions or is uncertain about date CIWA-Ar Total: 9 COWS:    Allergies:  Allergies  Allergen Reactions  . Bee Venom Anaphylaxis  . Penicillins Rash    Has patient had a PCN reaction causing immediate rash, facial/tongue/throat swelling, SOB or lightheadedness with  hypotension: {Yes Has patient had a PCN reaction causing severe rash involving mucus membranes or skin necrosis: NO Has patient had a PCN reaction that required hospitalization {Yes Has patient had a PCN reaction occurring within the last 10 years: NO If all of the above answers are "NO", then may proceed with Cephalosporin use.     Home Medications:  (Not in a hospital admission)  OB/GYN Status:  No LMP for male patient.  General Assessment Data Location of Assessment: [facility] TTS Assessment: In system Is this a Tele or Face-to-Face Assessment?: Face-to-Face Is this an Initial Assessment or a  Re-assessment for this encounter?: Initial Assessment Marital status: Widowed Is patient pregnant?: No Pregnancy Status: No Living Arrangements: Other (Comment) (homeless) Can pt return to current living arrangement?: Yes Admission Status: Voluntary Is patient capable of signing voluntary admission?: Yes Referral Source: Self/Family/Friend Insurance type: MCD  Medical Screening Exam ([place]) Medical Exam completed: Yes  Crisis Care Plan Living Arrangements: Other (Comment) (homeless) Name of Psychiatrist: none Name of Therapist: none  Education Status Is patient currently in school?: No  Risk to self with the past 6 months Suicidal Ideation: Yes-Currently Present Has patient been a risk to self within the past 6 months prior to admission? : Yes Suicidal Intent: Yes-Currently Present Has patient had any suicidal intent within the past 6 months prior to admission? : No Is patient at risk for suicide?: Yes Suicidal Plan?: Yes-Currently Present Has patient had any suicidal plan within the past 6 months prior to admission? : No Specify Current Suicidal Plan: drink himself to death Access to Means: Yes Specify Access to Suicidal Means: alcoholic beaveages What has been your use of drugs/alcohol within the last 12 months?: alcohol Previous Attempts/Gestures: Yes How many times?: 2 Triggers for Past Attempts: Other (Comment) (death of family) Intentional Self Injurious Behavior: None Family Suicide History: Yes Recent stressful life event(s): Loss (Comment), Conflict (Comment), Financial Problems, Trauma (Comment) Persecutory voices/beliefs?: No Depression: Yes Depression Symptoms: Isolating, Guilt, Loss of interest in usual pleasures Substance abuse history and/or treatment for substance abuse?: Yes  Risk to Others within the past 6 months Homicidal Ideation: No-Not Currently/Within Last 6 Months Does patient have any lifetime risk of violence toward others beyond  the six months prior to admission? : No Thoughts of Harm to Others: No-Not Currently Present/Within Last 6 Months Current Homicidal Intent: No-Not Currently/Within Last 6 Months Current Homicidal Plan: No-Not Currently/Within Last 6 Months Access to Homicidal Means: No History of harm to others?: No Assessment of Violence: None Noted Does patient have access to weapons?: No Criminal Charges Pending?: No Does patient have a court date: No Is patient on probation?: No  Psychosis Hallucinations: None noted Delusions: None noted  Mental Status Report Appearance/Hygiene: Disheveled Eye Contact: Good Motor Activity: Freedom of movement Speech: Logical/coherent, Slurred Level of Consciousness: Alert Mood: Depressed Affect: Anxious, Depressed Anxiety Level: Minimal Thought Processes: Relevant Judgement: Impaired Orientation: Person, Place, Time, Situation Obsessive Compulsive Thoughts/Behaviors: None  Cognitive Functioning Concentration: Poor Memory: Recent Intact, Remote Intact IQ: Average Insight: Poor Impulse Control: Poor Appetite: Poor Weight Loss: 0 Weight Gain: 0 Sleep: Decreased Total Hours of Sleep: 4 Vegetative Symptoms: None  ADLScreening [facility] Assessment Services) Patient's cognitive ability adequate to safely complete daily activities?: Yes Patient able to express need for assistance with ADLs?: Yes Independently performs ADLs?: Yes (appropriate for developmental age)  Prior Inpatient Therapy Prior Inpatient Therapy: Yes Prior Therapy Dates: 2015 Prior Therapy Facilty/Provider(s): [facility] Reason for Treatment: Substance abuse  Prior Outpatient Therapy Prior Outpatient Therapy: Yes Prior Therapy Dates: ongoing Prior Therapy Facilty/Provider(s): [facility] Reason for Treatment: Depression Does patient have an ACCT team?: No Does patient have Intensive In-House Services?  : No Does patient have [facility] services? : Yes Does patient have P4CC services?:  No  ADL Screening (condition at time of admission) Patient's cognitive ability adequate to safely complete daily activities?: Yes Patient able to express need for assistance with ADLs?: Yes Independently performs ADLs?: Yes (appropriate for developmental age)       Abuse/Neglect Assessment (Assessment to be complete while patient is alone) Physical Abuse: Denies Verbal Abuse: Denies Sexual Abuse: Denies Exploitation of patient/patient's resources: Denies Self-Neglect: Denies Values / Beliefs Cultural Requests During Hospitalization: None Spiritual Requests During Hospitalization: None Consults Spiritual Care Consult Needed: No Social Work Consult Needed: No Regulatory affairs officer (For Healthcare) Does patient have an advance directive?: No Would patient like information on creating an advanced directive?: No - patient declined information    Additional Information 1:1 In Past 12 Months?: No CIRT Risk: No Elopement Risk: No Does patient have medical clearance?: Yes     Disposition:  Disposition Initial Assessment Completed for this Encounter: Yes Disposition of Patient: Other dispositions (pending) Other disposition(s): Other (Comment) (Pending)  On Site Evaluation by:   Reviewed with Physician:    Chesley Noon A 12/11/2015 6:50 PM

## 2015-12-12 ENCOUNTER — Observation Stay (HOSPITAL_COMMUNITY)
Admission: AD | Admit: 2015-12-12 | Discharge: 2015-12-13 | Disposition: A | Discharge status: Home / Self Care | Payer: Medicaid Other | Source: Intra-hospital | Attending: Psychiatry | Admitting: Psychiatry

## 2015-12-12 ENCOUNTER — Encounter (HOSPITAL_COMMUNITY): Payer: Self-pay | Admitting: *Deleted

## 2015-12-12 DIAGNOSIS — R45851 Suicidal ideations: Secondary | ICD-10-CM | POA: Diagnosis not present

## 2015-12-12 DIAGNOSIS — F1094 Alcohol use, unspecified with alcohol-induced mood disorder: Secondary | ICD-10-CM

## 2015-12-12 DIAGNOSIS — K219 Gastro-esophageal reflux disease without esophagitis: Secondary | ICD-10-CM | POA: Diagnosis not present

## 2015-12-12 DIAGNOSIS — J449 Chronic obstructive pulmonary disease, unspecified: Secondary | ICD-10-CM | POA: Insufficient documentation

## 2015-12-12 DIAGNOSIS — F17210 Nicotine dependence, cigarettes, uncomplicated: Secondary | ICD-10-CM | POA: Diagnosis not present

## 2015-12-12 DIAGNOSIS — Z91030 Bee allergy status: Secondary | ICD-10-CM | POA: Diagnosis not present

## 2015-12-12 DIAGNOSIS — I10 Essential (primary) hypertension: Secondary | ICD-10-CM | POA: Diagnosis not present

## 2015-12-12 DIAGNOSIS — F331 Major depressive disorder, recurrent, moderate: Secondary | ICD-10-CM | POA: Insufficient documentation

## 2015-12-12 DIAGNOSIS — Z880 Allergy status to penicillin: Secondary | ICD-10-CM | POA: Insufficient documentation

## 2015-12-12 DIAGNOSIS — F1024 Alcohol dependence with alcohol-induced mood disorder: Secondary | ICD-10-CM | POA: Diagnosis not present

## 2015-12-12 LAB — RAPID URINE DRUG SCREEN, HOSP PERFORMED
Amphetamines: NOT DETECTED
BENZODIAZEPINES: NOT DETECTED
Barbiturates: NOT DETECTED
COCAINE: NOT DETECTED
OPIATES: NOT DETECTED
Tetrahydrocannabinol: NOT DETECTED

## 2015-12-12 MED ORDER — TRAZODONE HCL 100 MG PO TABS
100.0000 mg | ORAL_TABLET | Freq: Every evening | ORAL | Status: DC | PRN
  Administered 2015-12-12: 100 mg via ORAL
  Filled 2015-12-12: qty 1

## 2015-12-12 MED ORDER — BUPROPION HCL ER (SR) 100 MG PO TB12
ORAL_TABLET | ORAL | Status: AC
  Administered 2015-12-12: 200 mg via ORAL
  Filled 2015-12-12: qty 2

## 2015-12-12 MED ORDER — VITAMIN B-1 100 MG PO TABS
100.0000 mg | ORAL_TABLET | Freq: Every day | ORAL | Status: DC
  Administered 2015-12-13: 100 mg via ORAL
  Filled 2015-12-12: qty 1

## 2015-12-12 MED ORDER — MAGNESIUM HYDROXIDE 400 MG/5ML PO SUSP: 30.0000 mL | Freq: Every day | ORAL | Status: DC | PRN

## 2015-12-12 MED ORDER — GABAPENTIN 100 MG PO CAPS
100.0000 mg | ORAL_CAPSULE | Freq: Three times a day (TID) | ORAL | Status: DC
  Administered 2015-12-12 – 2015-12-13 (×2): 100 mg via ORAL
  Filled 2015-12-12 (×2): qty 1

## 2015-12-12 MED ORDER — IPRATROPIUM-ALBUTEROL 20-100 MCG/ACT IN AERS
1.0000 | INHALATION_SPRAY | Freq: Four times a day (QID) | RESPIRATORY_TRACT | Status: DC | PRN
  Filled 2015-12-12: qty 4

## 2015-12-12 MED ORDER — IBUPROFEN 600 MG PO TABS
600.0000 mg | ORAL_TABLET | Freq: Three times a day (TID) | ORAL | Status: DC | PRN
  Administered 2015-12-12 – 2015-12-13 (×2): 600 mg via ORAL
  Filled 2015-12-12 (×2): qty 1

## 2015-12-12 MED ORDER — FOLIC ACID 1 MG PO TABS
1.0000 mg | ORAL_TABLET | Freq: Every day | ORAL | Status: DC
  Administered 2015-12-13: 1 mg via ORAL
  Filled 2015-12-12: qty 1

## 2015-12-12 MED ORDER — ONDANSETRON HCL 4 MG PO TABS: 4.0000 mg | ORAL_TABLET | Freq: Three times a day (TID) | ORAL | Status: DC | PRN

## 2015-12-12 MED ORDER — LORAZEPAM 1 MG PO TABS
0.0000 mg | ORAL_TABLET | Freq: Four times a day (QID) | ORAL | Status: DC
Start: 2015-12-12 — End: 2015-12-13
  Administered 2015-12-12 (×2): 1 mg via ORAL
  Administered 2015-12-13: 2 mg via ORAL
  Filled 2015-12-12: qty 2
  Filled 2015-12-12 (×2): qty 1

## 2015-12-12 MED ORDER — LORAZEPAM 1 MG PO TABS: 0.0000 mg | ORAL_TABLET | Freq: Two times a day (BID) | ORAL | Status: DC

## 2015-12-12 MED ORDER — BUPROPION HCL ER (SR) 100 MG PO TB12
200.0000 mg | ORAL_TABLET | Freq: Every day | ORAL | Status: DC
  Administered 2015-12-12 (×2): 200 mg via ORAL

## 2015-12-12 MED ORDER — ALUM & MAG HYDROXIDE-SIMETH 200-200-20 MG/5ML PO SUSP: 30.0000 mL | ORAL | Status: DC | PRN

## 2015-12-12 MED ORDER — ZOLPIDEM TARTRATE 5 MG PO TABS
5.0000 mg | ORAL_TABLET | Freq: Every evening | ORAL | Status: DC | PRN
  Administered 2015-12-13: 5 mg via ORAL
  Filled 2015-12-12: qty 1

## 2015-12-12 MED ORDER — NICOTINE 21 MG/24HR TD PT24
21.0000 mg | MEDICATED_PATCH | Freq: Every day | TRANSDERMAL | Status: DC
  Administered 2015-12-13: 21 mg via TRANSDERMAL
  Filled 2015-12-12: qty 1

## 2015-12-12 MED ORDER — LORAZEPAM 1 MG PO TABS
1.0000 mg | ORAL_TABLET | Freq: Three times a day (TID) | ORAL | Status: DC | PRN
Start: 2015-12-12 — End: 2015-12-13

## 2015-12-12 MED ORDER — METOPROLOL TARTRATE 25 MG PO TABS
25.0000 mg | ORAL_TABLET | Freq: Every day | ORAL | Status: DC
  Administered 2015-12-13: 25 mg via ORAL
  Filled 2015-12-12: qty 1

## 2015-12-12 NOTE — H&P (Signed)
[facility] H [facility] P  Patient Identification: Samuel Reyes MRN: [id number] Principal Diagnosis: Alcohol-induced mood disorder [facility]) Diagnosis:  Patient Active Problem List   Diagnosis Date Noted  . Alcohol-induced mood disorder ([place]) [F10.94] 12/12/2015    Priority: High  . Alcohol use disorder, severe, dependence ([place]) [F10.20] 10/29/2015    Priority: High  . Syncope [R55] 11/15/2015  . Chest pain [R07.9] 11/15/2015  . Nausea vomiting and diarrhea [R11.2, R19.7] 11/15/2015  . Abdominal pain [R10.9] 11/15/2015  . Major depressive disorder, recurrent episode, moderate with anxious distress ([place]) [F33.1] 10/30/2015  . COPD exacerbation ([place]) [J44.1] 09/20/2015  . Acute respiratory failure with hypoxia ([place]) [J96.01] 09/18/2015  . Alcoholism /alcohol abuse ([place]) [F10.20]   . Suicidal ideation [R45.851] 09/17/2015  . Alcohol intoxication ([place]) [F10.129] 09/17/2015  . Depression [F32.9] 09/17/2015  . Benign essential HTN [I10] 09/17/2015  . Hypokalemia [E87.6] 09/17/2015  . Hyponatremia [E87.1] 09/17/2015  . Coffee ground emesis [K92.0] 09/17/2015  . COPD (chronic obstructive pulmonary disease) ([place]) [J44.9] 02/17/2013  . GERD (gastroesophageal reflux disease) [K21.9] 02/17/2013    Total Time spent with patient: 45 minutes  Subjective:  Samuel Reyes is a 60 y.o. male patient admitted with alcohol dependence and suicidal ideations.  HPI: Samuel Reyes is a 60 yo male who presented to the [facility] under the influence of alcohol with suicidal ideations. He is not feeling well today. Patient has suicidal ideations with no plan. Denies homicidal ideations and drug use. Samuel Reyes is agreeable to go to [facility] Observation Unit. Patient was inpatient on the [facility] hall in April of 2017 and was referred to [facility] but did not go. He reports going there six months ago. He feels that he is starting to experience  withdrawal symptoms such as "sweats, headache, and shakes." The patient reports being off his medications for at least a month. Patient stated "I have been drinking again. I just don't care what happens to me. I am depressed. I did not want [facility] again because I thought it was a waste of money. Now I feel like my health is falling apart. I can't keep drinking like this at my age. I also need a stable place to live. I just stay here and there. In the park when the weather is nice. I want to finally get over my drinking. I don't have a license or a car. I have been drinking for forty years." Today denies active suicidal ideation but admits to trying to kill himself via alcohol use in the recent past.   Past Psychiatric History: substance abuse  Risk to Self: Suicidal Ideation: Yes-Currently Present Suicidal Intent: Yes-Currently Present Is patient at risk for suicide?: Yes Suicidal Plan?: Yes-Currently Present Specify Current Suicidal Plan: drink himself to death Access to Means: Yes Specify Access to Suicidal Means: alcoholic beaveages What has been your use of drugs/alcohol within the last 12 months?: alcohol How many times?: 2 Triggers for Past Attempts: Other (Comment) (death of family) Intentional Self Injurious Behavior: None Risk to Others: Homicidal Ideation: No-Not Currently/Within Last 6 Months Thoughts of Harm to Others: No-Not Currently Present/Within Last 6 Months Current Homicidal Intent: No-Not Currently/Within Last 6 Months Current Homicidal Plan: No-Not Currently/Within Last 6 Months Access to Homicidal Means: No History of harm to others?: No Assessment of Violence: None Noted Does patient have access to weapons?: No Criminal Charges Pending?: No Does patient have a court date: No Prior Inpatient Therapy: Prior Inpatient Therapy: Yes Prior Therapy Dates: 2015 Prior Therapy Facilty/Provider(s): [facility] Reason for  Treatment: Substance abuse Prior Outpatient Therapy:  Prior Outpatient Therapy: Yes Prior Therapy Dates: ongoing Prior Therapy Facilty/Provider(s): [facility] Reason for Treatment: Depression Does patient have an ACCT team?: No Does patient have Intensive In-House Services? : No Does patient have [facility] services? : Yes Does patient have P4CC services?: No  Past Medical History:  Past Medical History  Diagnosis Date  . COPD (chronic obstructive pulmonary disease) ([place])   . GERD (gastroesophageal reflux disease)   . Depression   . Headache     Past Surgical History  Procedure Laterality Date  . Gastrectomy    . Shoulder surgery Bilateral     3 surgeries on on left, 2 surgeries on right   . Rt knee arthroscopic surgery    . Back surgery      3 cervical spine surgeries C4-C5 fused  . Hernia repair    . Finger surgery Left     2nd, 3rd, & 4th fingers were cut off by table saw and reattached  . Colonoscopy N/A 01/04/2014    Procedure: COLONOSCOPY; Surgeon: Danie Binder, MD; Location: [facility] ENDO SUITE; Service: Endoscopy; Laterality: N/A; 1:45  . Esophagogastroduodenoscopy N/A 01/04/2014    Procedure: ESOPHAGOGASTRODUODENOSCOPY (EGD); Surgeon: Danie Binder, MD; Location: [facility] ENDO SUITE; Service: Endoscopy; Laterality: N/A;  . Incisional hernia repair N/A 01/20/2014    Procedure: LAPAROSCOPIC RECURRENT INCISIONAL HERNIA with mesh; Surgeon: Edward Jolly, MD; Location: [facility] ORS; Service: General; Laterality: N/A;   Family History:  Family History  Problem Relation Age of Onset  . Cancer Father     bone  . Cancer Brother     lungs  . Stroke Maternal Grandmother   . Colon cancer Neg Hx   . Asthma Son     died at age 70 in his sleep   . Spina bifida Son     died at age 82   . Dementia Mother    Family Psychiatric History: none Social History:  History  Alcohol Use  . Yes    Comment: 'tries  to drink every day' ETOH abuse    History  Drug Use No    Comment: denid using any drugs    Social History   Social History  . Marital Status: Widowed    Spouse Name: N/A  . Number of Children: N/A  . Years of Education: N/A   Occupational History  . Disability    Social History Main Topics  . Smoking status: Current Every Day Smoker -- 1.00 packs/day for 45 years    Types: Cigarettes    Start date: 07/30/1966  . Smokeless tobacco: Never Used  . Alcohol Use: Yes     Comment: 'tries to drink every day' ETOH abuse  . Drug Use: No     Comment: denid using any drugs  . Sexual Activity: No   Other Topics Concern  . None   Social History Narrative   Additional Social History:    Allergies:  Allergies  Allergen Reactions  . Bee Venom Anaphylaxis  . Penicillins Rash    Has patient had a PCN reaction causing immediate rash, facial/tongue/throat swelling, SOB or lightheadedness with hypotension: {Yes Has patient had a PCN reaction causing severe rash involving mucus membranes or skin necrosis: NO Has patient had a PCN reaction that required hospitalization {Yes Has patient had a PCN reaction occurring within the last 10 years: NO If all of the above answers are "NO", then may proceed with Cephalosporin use.     Labs:  Lab Results Last 48 Hours    Results for orders placed or performed during the hospital encounter of 12/11/15 (from the past 48 hour(s))  Comprehensive metabolic panel Status: Abnormal   Collection Time: 12/11/15 3:14 PM  Result Value Ref Range   Sodium 139 135 - 145 mmol/L   Potassium 4.1 3.5 - 5.1 mmol/L   Chloride 105 101 - 111 mmol/L   CO2 21 (L) 22 - 32 mmol/L   Glucose, Bld 92 65 - 99 mg/dL   BUN 12 6 - 20 mg/dL   Creatinine, Ser 0.75 0.61 - 1.24 mg/dL   Calcium 8.1 (L) 8.9 - 10.3 mg/dL   Total Protein 7.5  6.5 - 8.1 g/dL   Albumin 3.6 3.5 - 5.0 g/dL   AST 36 15 - 41 U/L   ALT 25 17 - 63 U/L   Alkaline Phosphatase 111 38 - 126 U/L   Total Bilirubin 0.9 0.3 - 1.2 mg/dL   GFR calc non Af Amer >60 >60 mL/min   GFR calc Af Amer >60 >60 mL/min    Comment: (NOTE) The eGFR has been calculated using the CKD EPI equation. This calculation has not been validated in all clinical situations. eGFR's persistently <60 mL/min signify possible Chronic Kidney Disease.    Anion gap 13 5 - 15  Ethanol Status: Abnormal   Collection Time: 12/11/15 3:14 PM  Result Value Ref Range   Alcohol, Ethyl (B) 231 (H) <5 mg/dL    Comment:   LOWEST DETECTABLE LIMIT FOR SERUM ALCOHOL IS 5 mg/dL FOR MEDICAL PURPOSES ONLY   Salicylate level Status: None   Collection Time: 12/11/15 3:14 PM  Result Value Ref Range   Salicylate Lvl <3.2 2.8 - 30.0 mg/dL  Acetaminophen level Status: Abnormal   Collection Time: 12/11/15 3:14 PM  Result Value Ref Range   Acetaminophen (Tylenol), Serum <10 (L) 10 - 30 ug/mL    Comment:   THERAPEUTIC CONCENTRATIONS VARY SIGNIFICANTLY. A RANGE OF 10-30 ug/mL MAY BE AN EFFECTIVE CONCENTRATION FOR MANY PATIENTS. HOWEVER, SOME ARE BEST TREATED AT CONCENTRATIONS OUTSIDE THIS RANGE. ACETAMINOPHEN CONCENTRATIONS >150 ug/mL AT 4 HOURS AFTER INGESTION AND >50 ug/mL AT 12 HOURS AFTER INGESTION ARE OFTEN ASSOCIATED WITH TOXIC REACTIONS.   cbc Status: Abnormal   Collection Time: 12/11/15 3:14 PM  Result Value Ref Range   WBC 12.4 (H) 4.0 - 10.5 K/uL   RBC 5.23 4.22 - 5.81 MIL/uL   Hemoglobin 14.5 13.0 - 17.0 g/dL   HCT 43.6 39.0 - 52.0 %   MCV 83.4 78.0 - 100.0 fL   MCH 27.7 26.0 - 34.0 pg   MCHC 33.3 30.0 - 36.0 g/dL   RDW 17.9 (H) 11.5 - 15.5 %   Platelets 317 150 - 400 K/uL  Troponin I Status: None   Collection Time: 12/11/15  3:14 PM  Result Value Ref Range   Troponin I <0.03 <0.031 ng/mL    Comment:   NO INDICATION OF MYOCARDIAL INJURY.   Rapid urine drug screen (hospital performed) Status: None   Collection Time: 12/12/15 6:12 AM  Result Value Ref Range   Opiates NONE DETECTED NONE DETECTED   Cocaine NONE DETECTED NONE DETECTED   Benzodiazepines NONE DETECTED NONE DETECTED   Amphetamines NONE DETECTED NONE DETECTED   Tetrahydrocannabinol NONE DETECTED NONE DETECTED   Barbiturates NONE DETECTED NONE DETECTED    Comment:   DRUG SCREEN FOR MEDICAL PURPOSES ONLY. IF CONFIRMATION IS NEEDED FOR ANY PURPOSE, NOTIFY LAB WITHIN 5 DAYS.   LOWEST DETECTABLE LIMITS  FOR URINE DRUG SCREEN Drug Class Cutoff (ng/mL) Amphetamine 1000 Barbiturate 200 Benzodiazepine 132 Tricyclics 440 Opiates 102 Cocaine 300 THC 50       Current Facility-Administered Medications  Medication Dose Route Frequency Provider Last Rate Last Dose  . alum & mag hydroxide-simeth (MAALOX/MYLANTA) 200-200-20 MG/5ML suspension 30 mL 30 mL Oral PRN Larene Pickett, PA-C  30 mL at 12/11/15 1747  . buPROPion [facility] SR) 12 hr tablet 200 mg 200 mg Oral QHS Larene Pickett, PA-C  200 mg at 12/11/15 2114  . folic acid (FOLVITE) tablet 1 mg 1 mg Oral Daily Larene Pickett, PA-C  1 mg at 12/12/15 7253  . gabapentin (NEURONTIN) capsule 100 mg 100 mg Oral TID Larene Pickett, PA-C  100 mg at 12/12/15 6644  . ibuprofen (ADVIL,MOTRIN) tablet 600 mg 600 mg Oral Q8H PRN Larene Pickett, PA-C  600 mg at 12/12/15 0347  . Ipratropium-Albuterol (COMBIVENT) respimat 1 puff 1 puff Inhalation Q6H PRN Larene Pickett, PA-C    . LORazepam (ATIVAN) tablet 0-4 mg 0-4 mg Oral Q6H Larene Pickett, PA-C  2 mg at 12/12/15 1207   Followed by  . [START ON 12/13/2015]  LORazepam (ATIVAN) tablet 0-4 mg 0-4 mg Oral Q12H Larene Pickett, PA-C    . LORazepam (ATIVAN) tablet 1 mg 1 mg Oral Q8H PRN Larene Pickett, PA-C    . metoprolol tartrate (LOPRESSOR) tablet 25 mg 25 mg Oral QAC breakfast Larene Pickett, PA-C  25 mg at 12/12/15 0800  . nicotine (NICODERM CQ - dosed in mg/24 hours) patch 21 mg 21 mg Transdermal Daily Larene Pickett, PA-C  21 mg at 12/12/15 4259  . ondansetron (ZOFRAN) tablet 4 mg 4 mg Oral Q8H PRN Larene Pickett, PA-C    . thiamine (VITAMIN B-1) tablet 100 mg 100 mg Oral Daily Larene Pickett, PA-C  100 mg at 12/12/15 5638  . traZODone (DESYREL) tablet 100 mg 100 mg Oral QHS PRN Larene Pickett, PA-C    . zolpidem [facility]) tablet 5 mg 5 mg Oral QHS PRN Larene Pickett, PA-C  5 mg at 12/11/15 2117   Current Outpatient Prescriptions  Medication Sig Dispense Refill  . albuterol (PROVENTIL HFA;VENTOLIN HFA) 108 (90 Base) MCG/ACT inhaler Inhale 1-2 puffs into the lungs every 4 (four) hours as needed for wheezing. 1 Inhaler 1  . Aspirin-Acetaminophen-Caffeine (GOODY HEADACHE PO) Take 3 packets by mouth daily as needed (headaches).    Marland Kitchen buPROPion (WELLBUTRIN SR) 200 MG 12 hr tablet Take 200 mg by mouth at bedtime.    . folic acid (FOLVITE) 1 MG tablet Take 1 tablet (1 mg total) by mouth daily. 30 tablet 2  . gabapentin (NEURONTIN) 100 MG capsule Take 1 capsule (100 mg total) by mouth 3 (three) times daily. For agitation/alcohol withdrawal syndrome 90 capsule 0  . Ipratropium-Albuterol (COMBIVENT) 20-100 MCG/ACT AERS respimat Inhale 1 puff into the lungs every 6 (six) hours as needed for wheezing or shortness of breath. 1 Inhaler 1  . metoprolol tartrate (LOPRESSOR) 25 MG tablet Take 1 tablet (25 mg total) by mouth daily before breakfast. For high blood pressure 1 tablet 0  . nicotine (NICODERM CQ - DOSED IN MG/24 HOURS) 21 mg/24hr  patch Place 1 patch (21 mg total) onto the skin daily. For nicotine addiction 28 patch 0  . thiamine 100 MG tablet Take 1 tablet (100 mg total) by mouth daily. 30 tablet 2  . traZODone (DESYREL) 100 MG tablet Take 1  tablet (100 mg total) by mouth at bedtime as needed for sleep. 30 tablet 0  . budesonide (PULMICORT) 0.25 MG/2ML nebulizer solution Take 2 mLs (0.25 mg total) by nebulization 2 (two) times daily. For COPD (Patient not taking: Reported on 11/17/2015) 1 mL 1  . lidocaine (LIDODERM) 5 % Place 1 patch onto the skin daily. Remove & Discard patch within 12 hours or as directed by MD: For pain management: (Patient not taking: Reported on 11/17/2015) 7 patch 0  . mirtazapine (REMERON) 15 MG tablet Take 1 tablet (15 mg total) by mouth at bedtime. For depression/insomnia (Patient not taking: Reported on 11/17/2015) 30 tablet 0  . pantoprazole (PROTONIX) 40 MG tablet Take 1 tablet (40 mg total) by mouth daily. For acid reflux (Patient not taking: Reported on 11/17/2015) 1 tablet 1    Musculoskeletal: Strength & Muscle Tone: within normal limits Gait & Station: normal Patient leans: N/A  Psychiatric Specialty Exam: Review of Systems  Constitutional: Negative.  HENT: Negative.  Eyes: Negative.  Respiratory: Negative.  Cardiovascular: Negative.  Gastrointestinal: Negative.  Genitourinary: Negative.  Musculoskeletal: Negative.  Skin: Negative.  Neurological: Negative.  Endo/Heme/Allergies: Negative.  Psychiatric/Behavioral: Positive for depression and substance abuse.    Blood pressure 129/89, pulse 71, temperature 99.7 F (37.6 C), temperature source Oral, resp. rate 15, height _0  (1.626 m), weight 68.04 kg (150 lb), SpO2 99 %.Body mass index is 25.73 kg/(m^2).  General Appearance: Disheveled  Eye Sport and exercise psychologist:: Fair  Speech: Normal Rate  Volume: Normal  Mood: Anxious and Depressed  Affect: Congruent  Thought Process: Coherent   Orientation: Full (Time, Place, and Person)  Thought Content: Rumination  Suicidal Thoughts: Yes. without intent/plan  Homicidal Thoughts: No  Memory: Immediate; Fair Recent; Fair Remote; Fair  Judgement: Fair  Insight: Fair  Psychomotor Activity: Decreased  Concentration: Fair  Recall: [employer] of Knowledge:Fair  Language: Good  Akathisia: No  Handed: Right  AIMS (if indicated):    Assets: Leisure Time Physical Health Resilience  ADL's: Intact  Cognition: WNL  Sleep:     Treatment Plan Summary: Daily contact with patient to assess and evaluate symptoms and progress in treatment, Medication management and Plan alcohol induced mood disorder:  -Crisis stabilization -Medication management: Alcohol detox protocol started along with Wellbutrin 200 mg at bedtime for depression and gabapentin 100 mg TID for anxiety. -Individual and substance counseling  Disposition: Supportive therapy provided about ongoing stressors. Admit to the [facility] for further stabilization and medication management.   Elmarie Shiley, NP-C 12/12/2015 17:00        Agree with NP note and assessment as above

## 2015-12-12 NOTE — Progress Notes (Signed)
Patient in bed resting at the beginning of the shift. His mood and affects appropriate. He reported that he is feeling better, denied SI/HI and denied Hallucinations. He endorsed mild tremors and headache. Writer encouraged and supported patient. Safety maintained.

## 2015-12-12 NOTE — BH Assessment (Signed)
[place] Assessment Progress Note  Per Corena Pilgrim, MD, this pt does not require psychiatric hospitalization at this time, but he would benefit from admission to the [facility] Observation Unit.  Letitia Libra, RN, [facility] has assigned pt to Obs 2; they will be ready to receive pt at 13:00.  Pt has signed Voluntary Admission and Consent for Treatment, as well as Consent to Release Information to his sister and to [place], his outpatient provider, and a notification call has been placed to the latter.  Signed forms have been faxed to [facility].  Pt's nurse has been notified, and agrees to send original paperwork along with pt via [place] [place], and to call report to [phone] or [phone].  Jalene Mullet, [place] Triage Specialist [phone]

## 2015-12-12 NOTE — B[facility] Counselor (Addendum)
Spoke with pt. this morning and consulted with laura, NP. Pt is homeless and wishes to go to residential tx. Facility for [employer]. OI signed, referral faxed to [facility], and [facility] [RTS to be sent]. Pt to be re-eval in a.m.  Ryver Poblete K. Nash Shearer, LPC-A, [facility]  Counselor 12/12/2015 6:21 PM

## 2015-12-12 NOTE — Progress Notes (Addendum)
Pt is a 60 y/o caucasian male admitted from [facility] to [facility] (Obs. Unit) with c/o increased depression and ETOH abuse. Pt was d/c from [facility] in April 2017. Pt presents anxious on initial contact with flat affect. Pt ambulatory to Observation unit with a slow but steady gait. Pt denies SI, HI, AVH and pain at this time. Reported he last drank on Saturday (4 40 oz beer). Per pt he started drinking 40 years ago when his older son died and it got worse 6 years ago when his younger son died and then his wife 3 years ago. 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 Pt stated he has 3 DUI charges against him over the years, "I can't get a driver's license" Pt was cooperative with initial assessment. Maintained good eye contact. Skin assessment done and belongings searched per protocol. Pt's skin is intact; multiple tattoos noted on bilateral arms and hands, old abdominal scar on mid abdomen. Pt's belongings secured in locker 75. Unit orientation done and routines discussed with pt, understanding verbalized. Lunch and fluids offered. Emotional support, availability and encouragement provided to pt. Will continue to monitor pt for safety and mood stabilization.

## 2015-12-12 NOTE — Consult Note (Signed)
[place] Psychiatry Consult   Reason for Consult:  Alcohol intoxication with suicidal ideations Referring Physician:  EDP Patient Identification: Samuel Reyes MRN:  [id number] Principal Diagnosis: Alcohol-induced mood disorder ([place]) Diagnosis:   Patient Active Problem List   Diagnosis Date Noted  . Alcohol-induced mood disorder ([place]) [F10.94] 12/12/2015    Priority: High  . Alcohol use disorder, severe, dependence ([place]) [F10.20] 10/29/2015    Priority: High  . Syncope [R55] 11/15/2015  . Chest pain [R07.9] 11/15/2015  . Nausea vomiting and diarrhea [R11.2, R19.7] 11/15/2015  . Abdominal pain [R10.9] 11/15/2015  . Major depressive disorder, recurrent episode, moderate with anxious distress ([place]) [F33.1] 10/30/2015  . COPD exacerbation ([place]) [J44.1] 09/20/2015  . Acute respiratory failure with hypoxia ([place]) [J96.01] 09/18/2015  . Alcoholism /alcohol abuse ([place]) [F10.20]   . Suicidal ideation [R45.851] 09/17/2015  . Alcohol intoxication ([place]) [F10.129] 09/17/2015  . Depression [F32.9] 09/17/2015  . Benign essential HTN [I10] 09/17/2015  . Hypokalemia [E87.6] 09/17/2015  . Hyponatremia [E87.1] 09/17/2015  . Coffee ground emesis [K92.0] 09/17/2015  . COPD (chronic obstructive pulmonary disease) ([place]) [J44.9] 02/17/2013  . GERD (gastroesophageal reflux disease) [K21.9] 02/17/2013    Total Time spent with patient: 45 minutes  Subjective:   Samuel Reyes is a 60 y.o. male patient admitted with alcohol dependence and suicidal ideations.  HPI:  60 yo male who presented to the ED under the influence of alcohol with suicidal ideations.  He is not feeling well today.  "It felt good going down but doesn't now."  Suicidal ideations at time, no plan.  Denies homicidal ideations and drug use.  Samuel Reyes is agreeable to go to [facility] obsevation.  Past Psychiatric History: substance abuse  Risk to Self: Suicidal Ideation: Yes-Currently Present Suicidal Intent: Yes-Currently Present Is  patient at risk for suicide?: Yes Suicidal Plan?: Yes-Currently Present Specify Current Suicidal Plan: drink himself to death Access to Means: Yes Specify Access to Suicidal Means: alcoholic beaveages What has been your use of drugs/alcohol within the last 12 months?: alcohol How many times?: 2 Triggers for Past Attempts: Other (Comment) (death of family) Intentional Self Injurious Behavior: None Risk to Others: Homicidal Ideation: No-Not Currently/Within Last 6 Months Thoughts of Harm to Others: No-Not Currently Present/Within Last 6 Months Current Homicidal Intent: No-Not Currently/Within Last 6 Months Current Homicidal Plan: No-Not Currently/Within Last 6 Months Access to Homicidal Means: No History of harm to others?: No Assessment of Violence: None Noted Does patient have access to weapons?: No Criminal Charges Pending?: No Does patient have a court date: No Prior Inpatient Therapy: Prior Inpatient Therapy: Yes Prior Therapy Dates: 2015 Prior Therapy Facilty/Provider(s): [facility] Reason for Treatment: Substance abuse Prior Outpatient Therapy: Prior Outpatient Therapy: Yes Prior Therapy Dates: ongoing Prior Therapy Facilty/Provider(s): [facility] Reason for Treatment: Depression Does patient have an ACCT team?: No Does patient have Intensive In-House Services?  : No Does patient have [facility] services? : Yes Does patient have P4CC services?: No  Past Medical History:  Past Medical History  Diagnosis Date  . COPD (chronic obstructive pulmonary disease) ([place])   . GERD (gastroesophageal reflux disease)   . Depression   . Headache     Past Surgical History  Procedure Laterality Date  . Gastrectomy    . Shoulder surgery Bilateral     3 surgeries on on left, 2 surgeries on right   . Rt knee arthroscopic surgery    . Back surgery      3 cervical spine surgeries C4-C5 fused  . Hernia repair    .  Finger surgery Left     2nd, 3rd, & 4th fingers were cut off by table saw and  reattached  . Colonoscopy N/A 01/04/2014    Procedure: COLONOSCOPY;  Surgeon: Danie Binder, MD;  Location: [facility];  Service: Endoscopy;  Laterality: N/A;  1:45  . Esophagogastroduodenoscopy N/A 01/04/2014    Procedure: ESOPHAGOGASTRODUODENOSCOPY (EGD);  Surgeon: Danie Binder, MD;  Location: [facility];  Service: Endoscopy;  Laterality: N/A;  . Incisional hernia repair N/A 01/20/2014    Procedure: LAPAROSCOPIC RECURRENT  INCISIONAL HERNIA with mesh;  Surgeon: Edward Jolly, MD;  Location: [facility] ORS;  Service: General;  Laterality: N/A;   Family History:  Family History  Problem Relation Age of Onset  . Cancer Father     bone  . Cancer Brother     lungs  . Stroke Maternal Grandmother   . Colon cancer Neg Hx   . Asthma Son     died at age 26 in his sleep   . Spina bifida Son     died at age 62   . Dementia Mother    Family Psychiatric  History: none Social History:  History  Alcohol Use  . Yes    Comment: 'tries to drink every day' ETOH abuse     History  Drug Use No    Comment: denid using any drugs    Social History   Social History  . Marital Status: Widowed    Spouse Name: N/A  . Number of Children: N/A  . Years of Education: N/A   Occupational History  . Disability    Social History Main Topics  . Smoking status: Current Every Day Smoker -- 1.00 packs/day for 45 years    Types: Cigarettes    Start date: 07/30/1966  . Smokeless tobacco: Never Used  . Alcohol Use: Yes     Comment: 'tries to drink every day' ETOH abuse  . Drug Use: No     Comment: denid using any drugs  . Sexual Activity: No   Other Topics Concern  . None   Social History Narrative   Additional Social History:    Allergies:   Allergies  Allergen Reactions  . Bee Venom Anaphylaxis  . Penicillins Rash    Has patient had a PCN reaction causing immediate rash, facial/tongue/throat swelling, SOB or lightheadedness with hypotension: {Yes Has patient had a PCN reaction causing  severe rash involving mucus membranes or skin necrosis: NO Has patient had a PCN reaction that required hospitalization {Yes Has patient had a PCN reaction occurring within the last 10 years: NO If all of the above answers are "NO", then may proceed with Cephalosporin use.     Labs:  Results for orders placed or performed during the hospital encounter of 12/11/15 (from the past 48 hour(s))  Comprehensive metabolic panel     Status: Abnormal   Collection Time: 12/11/15  3:14 PM  Result Value Ref Range   Sodium 139 135 - 145 mmol/L   Potassium 4.1 3.5 - 5.1 mmol/L   Chloride 105 101 - 111 mmol/L   CO2 21 (L) 22 - 32 mmol/L   Glucose, Bld 92 65 - 99 mg/dL   BUN 12 6 - 20 mg/dL   Creatinine, Ser 0.75 0.61 - 1.24 mg/dL   Calcium 8.1 (L) 8.9 - 10.3 mg/dL   Total Protein 7.5 6.5 - 8.1 g/dL   Albumin 3.6 3.5 - 5.0 g/dL   AST 36 15 - 41 U/L  ALT 25 17 - 63 U/L   Alkaline Phosphatase 111 38 - 126 U/L   Total Bilirubin 0.9 0.3 - 1.2 mg/dL   GFR calc non Af Amer >60 >60 mL/min   GFR calc Af Amer >60 >60 mL/min    Comment: (NOTE) The eGFR has been calculated using the CKD EPI equation. This calculation has not been validated in all clinical situations. eGFR's persistently <60 mL/min signify possible Chronic Kidney Disease.    Anion gap 13 5 - 15  Ethanol     Status: Abnormal   Collection Time: 12/11/15  3:14 PM  Result Value Ref Range   Alcohol, Ethyl (B) 231 (H) <5 mg/dL    Comment:        LOWEST DETECTABLE LIMIT FOR SERUM ALCOHOL IS 5 mg/dL FOR MEDICAL PURPOSES ONLY   Salicylate level     Status: None   Collection Time: 12/11/15  3:14 PM  Result Value Ref Range   Salicylate Lvl <9.5 2.8 - 30.0 mg/dL  Acetaminophen level     Status: Abnormal   Collection Time: 12/11/15  3:14 PM  Result Value Ref Range   Acetaminophen (Tylenol), Serum <10 (L) 10 - 30 ug/mL    Comment:        THERAPEUTIC CONCENTRATIONS VARY SIGNIFICANTLY. A RANGE OF 10-30 ug/mL MAY BE AN  EFFECTIVE CONCENTRATION FOR MANY PATIENTS. HOWEVER, SOME ARE BEST TREATED AT CONCENTRATIONS OUTSIDE THIS RANGE. ACETAMINOPHEN CONCENTRATIONS >150 ug/mL AT 4 HOURS AFTER INGESTION AND >50 ug/mL AT 12 HOURS AFTER INGESTION ARE OFTEN ASSOCIATED WITH TOXIC REACTIONS.   cbc     Status: Abnormal   Collection Time: 12/11/15  3:14 PM  Result Value Ref Range   WBC 12.4 (H) 4.0 - 10.5 K/uL   RBC 5.23 4.22 - 5.81 MIL/uL   Hemoglobin 14.5 13.0 - 17.0 g/dL   HCT 43.6 39.0 - 52.0 %   MCV 83.4 78.0 - 100.0 fL   MCH 27.7 26.0 - 34.0 pg   MCHC 33.3 30.0 - 36.0 g/dL   RDW 17.9 (H) 11.5 - 15.5 %   Platelets 317 150 - 400 K/uL  Troponin I     Status: None   Collection Time: 12/11/15  3:14 PM  Result Value Ref Range   Troponin I <0.03 <0.031 ng/mL    Comment:        NO INDICATION OF MYOCARDIAL INJURY.   Rapid urine drug screen (hospital performed)     Status: None   Collection Time: 12/12/15  6:12 AM  Result Value Ref Range   Opiates NONE DETECTED NONE DETECTED   Cocaine NONE DETECTED NONE DETECTED   Benzodiazepines NONE DETECTED NONE DETECTED   Amphetamines NONE DETECTED NONE DETECTED   Tetrahydrocannabinol NONE DETECTED NONE DETECTED   Barbiturates NONE DETECTED NONE DETECTED    Comment:        DRUG SCREEN FOR MEDICAL PURPOSES ONLY.  IF CONFIRMATION IS NEEDED FOR ANY PURPOSE, NOTIFY LAB WITHIN 5 DAYS.        LOWEST DETECTABLE LIMITS FOR URINE DRUG SCREEN Drug Class       Cutoff (ng/mL) Amphetamine      1000 Barbiturate      200 Benzodiazepine   093 Tricyclics       267 Opiates          300 Cocaine          300 THC              50     Current Facility-Administered  Medications  Medication Dose Route Frequency Provider Last Rate Last Dose  . alum & mag hydroxide-simeth (MAALOX/MYLANTA) 200-200-20 MG/5ML suspension 30 mL  30 mL Oral PRN Larene Pickett, PA-C   30 mL at 12/11/15 1747  . buPROPion [facility] SR) 12 hr tablet 200 mg  200 mg Oral QHS Larene Pickett, PA-C   200 mg  at 12/11/15 2114  . folic acid (FOLVITE) tablet 1 mg  1 mg Oral Daily Larene Pickett, PA-C   1 mg at 12/12/15 7741  . gabapentin (NEURONTIN) capsule 100 mg  100 mg Oral TID Larene Pickett, PA-C   100 mg at 12/12/15 2878  . ibuprofen (ADVIL,MOTRIN) tablet 600 mg  600 mg Oral Q8H PRN Larene Pickett, PA-C   600 mg at 12/12/15 6767  . Ipratropium-Albuterol (COMBIVENT) respimat 1 puff  1 puff Inhalation Q6H PRN Larene Pickett, PA-C      . LORazepam (ATIVAN) tablet 0-4 mg  0-4 mg Oral Q6H Larene Pickett, PA-C   2 mg at 12/12/15 1207   Followed by  . [START ON 12/13/2015] LORazepam (ATIVAN) tablet 0-4 mg  0-4 mg Oral Q12H Larene Pickett, PA-C      . LORazepam (ATIVAN) tablet 1 mg  1 mg Oral Q8H PRN Larene Pickett, PA-C      . metoprolol tartrate (LOPRESSOR) tablet 25 mg  25 mg Oral QAC breakfast Larene Pickett, PA-C   25 mg at 12/12/15 0800  . nicotine (NICODERM CQ - dosed in mg/24 hours) patch 21 mg  21 mg Transdermal Daily Larene Pickett, PA-C   21 mg at 12/12/15 2094  . ondansetron (ZOFRAN) tablet 4 mg  4 mg Oral Q8H PRN Larene Pickett, PA-C      . thiamine (VITAMIN B-1) tablet 100 mg  100 mg Oral Daily Larene Pickett, PA-C   100 mg at 12/12/15 7096  . traZODone (DESYREL) tablet 100 mg  100 mg Oral QHS PRN Larene Pickett, PA-C      . zolpidem [facility]) tablet 5 mg  5 mg Oral QHS PRN Larene Pickett, PA-C   5 mg at 12/11/15 2117   Current Outpatient Prescriptions  Medication Sig Dispense Refill  . albuterol (PROVENTIL HFA;VENTOLIN HFA) 108 (90 Base) MCG/ACT inhaler Inhale 1-2 puffs into the lungs every 4 (four) hours as needed for wheezing. 1 Inhaler 1  . Aspirin-Acetaminophen-Caffeine (GOODY HEADACHE PO) Take 3 packets by mouth daily as needed (headaches).    Marland Kitchen buPROPion (WELLBUTRIN SR) 200 MG 12 hr tablet Take 200 mg by mouth at bedtime.    . folic acid (FOLVITE) 1 MG tablet Take 1 tablet (1 mg total) by mouth daily. 30 tablet 2  . gabapentin (NEURONTIN) 100 MG capsule Take 1 capsule (100 mg total)  by mouth 3 (three) times daily. For agitation/alcohol withdrawal syndrome 90 capsule 0  . Ipratropium-Albuterol (COMBIVENT) 20-100 MCG/ACT AERS respimat Inhale 1 puff into the lungs every 6 (six) hours as needed for wheezing or shortness of breath. 1 Inhaler 1  . metoprolol tartrate (LOPRESSOR) 25 MG tablet Take 1 tablet (25 mg total) by mouth daily before breakfast. For high blood pressure 1 tablet 0  . nicotine (NICODERM CQ - DOSED IN MG/24 HOURS) 21 mg/24hr patch Place 1 patch (21 mg total) onto the skin daily. For nicotine addiction 28 patch 0  . thiamine 100 MG tablet Take 1 tablet (100 mg total) by mouth daily. 30 tablet 2  . traZODone (DESYREL)  100 MG tablet Take 1 tablet (100 mg total) by mouth at bedtime as needed for sleep. 30 tablet 0  . budesonide (PULMICORT) 0.25 MG/2ML nebulizer solution Take 2 mLs (0.25 mg total) by nebulization 2 (two) times daily. For COPD (Patient not taking: Reported on 11/17/2015) 1 mL 1  . lidocaine (LIDODERM) 5 % Place 1 patch onto the skin daily. Remove & Discard patch within 12 hours or as directed by MD: For pain management: (Patient not taking: Reported on 11/17/2015) 7 patch 0  . mirtazapine (REMERON) 15 MG tablet Take 1 tablet (15 mg total) by mouth at bedtime. For depression/insomnia (Patient not taking: Reported on 11/17/2015) 30 tablet 0  . pantoprazole (PROTONIX) 40 MG tablet Take 1 tablet (40 mg total) by mouth daily. For acid reflux (Patient not taking: Reported on 11/17/2015) 1 tablet 1    Musculoskeletal: Strength & Muscle Tone: within normal limits Gait & Station: normal Patient leans: N/A  Psychiatric Specialty Exam: Review of Systems  Constitutional: Negative.   HENT: Negative.   Eyes: Negative.   Respiratory: Negative.   Cardiovascular: Negative.   Gastrointestinal: Negative.   Genitourinary: Negative.   Musculoskeletal: Negative.   Skin: Negative.   Neurological: Negative.   Endo/Heme/Allergies: Negative.   Psychiatric/Behavioral:  Positive for depression and substance abuse.    Blood pressure 129/89, pulse 71, temperature 99.7 F (37.6 C), temperature source Oral, resp. rate 15, height 5' 4"  (1.626 m), weight 68.04 kg (150 lb), SpO2 99 %.Body mass index is 25.73 kg/(m^2).  General Appearance: Disheveled  Eye Sport and exercise psychologist::  Fair  Speech:  Normal Rate  Volume:  Normal  Mood:  Anxious and Depressed  Affect:  Congruent  Thought Process:  Coherent  Orientation:  Full (Time, Place, and Person)  Thought Content:  Rumination  Suicidal Thoughts:  Yes.  without intent/plan  Homicidal Thoughts:  No  Memory:  Immediate;   Fair Recent;   Fair Remote;   Fair  Judgement:  Fair  Insight:  Fair  Psychomotor Activity:  Decreased  Concentration:  Fair  Recall:  [employer] of Knowledge:Fair  Language: Good  Akathisia:  No  Handed:  Right  AIMS (if indicated):     Assets:  Leisure Time Physical Health Resilience  ADL's:  Intact  Cognition: WNL  Sleep:      Treatment Plan Summary: Daily contact with patient to assess and evaluate symptoms and progress in treatment, Medication management and Plan alcohol induced mood disorder:  -Crisis stabilization -Medication management:  Alcohol detox protocol started along with Wellbutrin 200 mg at bedtime for sleep and gabapentin 100 mg TID for anxiety. -Individual and substance counseling  Disposition: Supportive therapy provided about ongoing stressors.  Waylan Boga, NP 12/12/2015 1:17 PM Patient seen face-to-face for psychiatric evaluation, chart reviewed and case discussed with the physician extender and developed treatment plan. Reviewed the information documented and agree with the treatment plan. Corena Pilgrim, MD

## 2015-12-13 DIAGNOSIS — F1094 Alcohol use, unspecified with alcohol-induced mood disorder: Secondary | ICD-10-CM | POA: Diagnosis not present

## 2015-12-13 DIAGNOSIS — F1024 Alcohol dependence with alcohol-induced mood disorder: Secondary | ICD-10-CM | POA: Diagnosis not present

## 2015-12-13 MED ORDER — FOLIC ACID 1 MG PO TABS: 1.0000 mg | ORAL_TABLET | Freq: Every day | ORAL | Status: DC

## 2015-12-13 MED ORDER — BUPROPION HCL ER (SR) 200 MG PO TB12: 200.0000 mg | ORAL_TABLET | Freq: Every day | ORAL | Status: DC

## 2015-12-13 MED ORDER — GABAPENTIN 100 MG PO CAPS: 100.0000 mg | ORAL_CAPSULE | Freq: Three times a day (TID) | ORAL | Status: DC

## 2015-12-13 MED ORDER — THIAMINE HCL 100 MG PO TABS: 100.0000 mg | ORAL_TABLET | Freq: Every day | ORAL | Status: DC

## 2015-12-13 MED ORDER — TRAZODONE HCL 100 MG PO TABS: 100.0000 mg | ORAL_TABLET | Freq: Every evening | ORAL | Status: DC | PRN

## 2015-12-13 NOTE — Discharge Instructions (Signed)
You are encouraged to follow up with shelters in [facility] , a list has been provided for you and you have agreed to help with shelter needs. Also, you have been accepted to [facility] for Residential Substance Abuse treatment. Caryl Pina is the point of contact at [facility] should you have additional questions. Your appointment is for January 02, 2016 at 10 a.m. And you should have x 1 week supply of clothing as well as 30 day supply of medication. You are encouraged to follow up with [facility] and let them know prior to appointments so that you are able to get medication supply needed.  Additionally, you are encouraged to follow up with [facility] for outpatient substance abuse treatment.

## 2015-12-13 NOTE — Discharge Summary (Signed)
[place] [facility] Discharge Summary Note  Patient:  Samuel Reyes is an 60 y.o., male MRN:  [id number] DOB:  11-08-1955 Patient phone:  [phone] (home)  Patient address:   [address] [place] [address],  Total Time spent with patient: Greater than 30 minutes  Date of Admission:  12/12/2015 Date of Discharge: 12/13/2015  Reason for Admission: Alcohol detox/mood stabilization  Principal Problem: Alcohol-induced mood disorder [facility])  Discharge Diagnoses: Patient Active Problem List   Diagnosis Date Noted  . Alcohol-induced mood disorder ([place]) [F10.94] 12/12/2015  . Syncope [R55] 11/15/2015  . Chest pain [R07.9] 11/15/2015  . Nausea vomiting and diarrhea [R11.2, R19.7] 11/15/2015  . Abdominal pain [R10.9] 11/15/2015  . Major depressive disorder, recurrent episode, moderate with anxious distress ([place]) [F33.1] 10/30/2015  . Alcohol use disorder, severe, dependence ([place]) [F10.20] 10/29/2015  . COPD exacerbation ([place]) [J44.1] 09/20/2015  . Acute respiratory failure with hypoxia ([place]) [J96.01] 09/18/2015  . Alcoholism /alcohol abuse ([place]) [F10.20]   . Suicidal ideation [R45.851] 09/17/2015  . Alcohol intoxication ([place]) [F10.129] 09/17/2015  . Depression [F32.9] 09/17/2015  . Benign essential HTN [I10] 09/17/2015  . Hypokalemia [E87.6] 09/17/2015  . Hyponatremia [E87.1] 09/17/2015  . Coffee ground emesis [K92.0] 09/17/2015  . COPD (chronic obstructive pulmonary disease) ([place]) [J44.9] 02/17/2013  . GERD (gastroesophageal reflux disease) [K21.9] 02/17/2013   Past Psychiatric History: Alcoholism, Major depression.  Past Medical History:  Past Medical History  Diagnosis Date  . COPD (chronic obstructive pulmonary disease) ([place])   . GERD (gastroesophageal reflux disease)   . Depression   . Headache     Past Surgical History  Procedure Laterality Date  . Gastrectomy    . Shoulder surgery Bilateral     3 surgeries on on left, 2 surgeries on right   . Rt knee  arthroscopic surgery    . Back surgery      3 cervical spine surgeries C4-C5 fused  . Hernia repair    . Finger surgery Left     2nd, 3rd, & 4th fingers were cut off by table saw and reattached  . Colonoscopy N/A 01/04/2014    Procedure: COLONOSCOPY;  Surgeon: Danie Binder, MD;  [facility];  Service: Endoscopy;  Laterality: N/A;  1:45  . Esophagogastroduodenoscopy N/A 01/04/2014    Procedure: ESOPHAGOGASTRODUODENOSCOPY (EGD);  Surgeon: Danie Binder, MD;  Location[facility];  Service: Endoscopy;  Laterality: N/A;  . Incisional hernia repair N/A 01/20/2014    Procedure: LAPAROSCOPIC RECURRENT  INCISIONAL HERNIA with mesh;  Surgeon: Edward Jolly, MD;  Location: [facility];  Service: General;  Laterality: N/A;   Family History:  Family History  Problem Relation Age of Onset  . Cancer Father     bone  . Cancer Brother     lungs  . Stroke Maternal Grandmother   . Colon cancer Neg Hx   . Asthma Son     died at age 51 in his sleep   . Spina bifida Son     died at age 64   . Dementia Mother    Family Psychiatric  History: See H & P  Social History:  History  Alcohol Use  . Yes    Comment: 'tries to drink every day' ETOH abuse     History  Drug Use No    Comment: denid using any drugs    Social History   Social History  . Marital Status: Widowed    Spouse Name: N/A  . Number of Children:  N/A  . Years of Education: N/A   Occupational History  . Disability    Social History Main Topics  . Smoking status: Current Every Day Smoker -- 1.00 packs/day for 45 years    Types: Cigarettes    Start date: 07/30/1966  . Smokeless tobacco: Never Used  . Alcohol Use: Yes     Comment: 'tries to drink every day' ETOH abuse  . Drug Use: No     Comment: denid using any drugs  . Sexual Activity: No   Other Topics Concern  . None   Social History Narrative   Hospital Course:   Samuel Reyes is a 60 yo male who presented to the [facility] under the influence of  alcohol with suicidal ideations. He is not feeling well today. Patient has suicidal ideations with no plan. Denies homicidal ideations and drug use. Samuel Reyes is agreeable to go to [facility]. Patient was inpatient on the [facility] hall in April of 2017 and was referred to [facility] but did not go. He reports going there six months ago. He feels that he is starting to experience withdrawal symptoms such as "sweats, headache, and shakes." The patient reports being off his medications for at least a month. Patient stated "I have been drinking again. I just don't care what happens to me. I am depressed. I did not want [facility] again because I thought it was a waste of money. Now I feel like my health is falling apart. I can't keep drinking like this at my age. I also need a stable place to live. I just stay here and there. In the park when the weather is nice. I want to finally get over my drinking. I don't have a license or a car. I have been drinking for forty years." Today denies active suicidal ideation but admits to trying to kill himself via alcohol use in the recent past.   Patient was monitored overnight in the [place] [facility]. He had one episode of feeling dizzy with a near fall episode. During assessment on 12/13/2015 the patient denied any further sensation of dizziness and was observed walking with a steady gait. Patient denied any further suicidal ideation and appeared to be very future oriented. 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 Patient reported that he functioned very well during an eight month period of sobriety in the past. He requested and was provided with a list of resources for shelters in the [facility] area. Patient reported that he was looking to "get away from all the drinkers that I know in the [place] area." The patient was given resources for residential treatment by the Observation Counselor. He was  provided with prescriptions for his psychiatric medication to include Neurontin and Wellbutrin.   Upon discharge, he adamantly denies any suicidal, homicidal ideations, auditory, visual hallucinations, delusional thoughts, paranoia & or withdrawal symptoms. Samuel Reyes left [facility] with all personal belongings in no apparent distress. Transportation per city bus. Bus pass provided by [facility]   Physical Findings: AIMS:  , ,  ,  ,    CIWA:  CIWA-Ar Total: 8 COWS:     Musculoskeletal: Strength & Muscle Tone: within normal limits Gait & Station: normal Patient leans: N/A  Psychiatric Specialty Exam: Review of Systems  Constitutional: Negative.   HENT: Negative.   Eyes: Negative.   Respiratory: Negative.   Cardiovascular: Negative.   Gastrointestinal: Negative.   Genitourinary: Negative.   Musculoskeletal: Negative.   Skin:  Negative.   Neurological: Negative.   Endo/Heme/Allergies: Negative.   Psychiatric/Behavioral: Positive for depression (Stable) and substance abuse (Alcohol dependence). Negative for suicidal ideas, hallucinations and memory loss. The patient has insomnia (Stable). The patient is not nervous/anxious.     Blood pressure 127/88, pulse 86, temperature 98.3 F (36.8 C), temperature source Oral, resp. rate 18, height 5\' 4"  (1.626 m), weight 68.04 kg (150 lb), SpO2 97 %.Body mass index is 25.73 kg/(m^2).  See Md's SRA   Have you used any form of tobacco in the last 30 days? (Cigarettes, Smokeless Tobacco, Cigars, and/or Pipes): Yes  Has this patient used any form of tobacco in the last 30 days? (Cigarettes, Smokeless Tobacco, Cigars, and/or Pipes) Yes, Yes, A prescription for an FDA-approved tobacco cessation medication was offered at discharge and the patient refused  Blood Alcohol level:  Lab Results  Component Value Date   ETH 231* 12/11/2015   ETH 357* [id number]    Metabolic Disorder Labs:  Lab Results  Component Value Date   HGBA1C 5.9* 11/16/2015   MPG 123  11/16/2015   MPG 114 01/18/2015   No results found for: PROLACTIN Lab Results  Component Value Date   CHOL 247* 02/17/2013   TRIG 144 02/17/2013   HDL 79 02/17/2013   CHOLHDL 3.1 02/17/2013   VLDL 29 02/17/2013   LDLCALC 139* 02/17/2013   Discharge destination:  Home  Is patient on multiple antipsychotic therapies at discharge:  No   Has Patient had three or more failed trials of antipsychotic monotherapy by history:  No  Recommended Plan for Multiple Antipsychotic Therapies: NA     Discharge Instructions    Diet - low sodium heart healthy    Complete by:  As directed      Discharge instructions    Complete by:  As directed   Please follow up with your Primary Care Provider for further management of any chronic medical problems such as Hypertension.            Medication List    STOP taking these medications        Ipratropium-Albuterol 20-100 MCG/ACT Aers respimat  Commonly known as:  COMBIVENT      TAKE these medications      Indication   albuterol 108 (90 Base) MCG/ACT inhaler  Commonly known as:  PROVENTIL HFA;VENTOLIN HFA  Inhale 1-2 puffs into the lungs every 4 (four) hours as needed for wheezing.   Indication:  Asthma     buPROPion 200 MG 12 hr tablet  Commonly known as:  WELLBUTRIN SR  Take 1 tablet (200 mg total) by mouth at bedtime.   Indication:  Major Depressive Disorder     folic acid 1 MG tablet  Commonly known as:  FOLVITE  Take 1 tablet (1 mg total) by mouth daily.   Indication:  Vitamin Supplementation     gabapentin 100 MG capsule  Commonly known as:  NEURONTIN  Take 1 capsule (100 mg total) by mouth 3 (three) times daily. For agitation/alcohol withdrawal syndrome   Indication:  Agitation, Alcohol Withdrawal Syndrome     GOODY HEADACHE PO  Take 3 packets by mouth daily as needed (headaches).      metoprolol tartrate 25 MG tablet  Commonly known as:  LOPRESSOR  Take 1 tablet (25 mg total) by mouth daily before breakfast. For high  blood pressure   Indication:  High Blood Pressure     nicotine 21 mg/24hr patch  Commonly known as:  NICODERM CQ -  dosed in mg/24 hours  Place 1 patch (21 mg total) onto the skin daily. For nicotine addiction   Indication:  Nicotine Addiction     thiamine 100 MG tablet  Take 1 tablet (100 mg total) by mouth daily.   Indication:  Deficiency in Thiamine or Vitamin B1     traZODone 100 MG tablet  Commonly known as:  DESYREL  Take 1 tablet (100 mg total) by mouth at bedtime as needed for sleep.   Indication:  Trouble Sleeping       Follow-up Information    Follow up with [facility]. Go on 01/02/2016.   Why:   residential substance abuse treatment.    Contact information:   [place], [place] [address]  [phone]      Follow up with [facility]. Call in 1 day.   Why:  for follow up/ other option residential treatment    Contact information:   [place], [place]  [phone]       Follow up with [facility]. Call in 1 day.   Why:  follow up option residential treatment   Contact information:   [place], [place]  [phone]      Follow up with [facility]. Go in 1 day.   Why:  Why: follow up services substance abuse outpatient care/ walk-in   Contact information:   [address] [place], [place]  [phone]     Follow-up recommendations:  Activity:  As tolerated Diet: As recommended by your primary care doctor. Keep all scheduled follow-up appointments as recommended.  Comments:  Take all your medications as prescribed by your mental healthcare provider. Report any adverse effects and or reactions from your medicines to your outpatient provider promptly. Patient is instructed and cautioned to not engage in alcohol and or illegal drug use while on prescription medicines. In the event of worsening symptoms, patient is instructed to call the crisis hotline, 911 and or go to the nearest ED for appropriate  evaluation and treatment of symptoms. Follow-up with your primary care provider for your other medical issues, concerns and or health care needs.   SignedElmarie Shiley, NP, NP-C 12/13/2015, 11:23 AM  Agree with NP note as above

## 2015-12-13 NOTE — Progress Notes (Addendum)
Patient got up around 0130 am and had a near fall episode. He slumped on his bed. He reported feeling dizzy. Staff checked patient vital signs B/P was somewhat elevated, 02 sat 100 percent. We rechecked it about 15 minute after it was better than the fist one 143/82. PRN Ambien given to help patient get back to sleep. Staff encouraged patient to get up slowly whenever he wants to go out of his bed or request for help if needed. Safety maintained.

## 2015-12-13 NOTE — Progress Notes (Signed)
Patient discharged per physician order; patient alert and oriented x4; patient received prescriptions and AVS after it was reviewed; patient denies SI/HI and A/V hallucinations; patient signed that he received all belongings; patient had no other questions or concerns at this time; patient left the unit ambulatory

## 2015-12-13 NOTE — [facility] Counselor (Signed)
Spoke with pt. This a.m. Pt given shelter sources in [place] as requested to call.  Paysen Goza K. Nash Shearer, LPC-A, [facility]  Counselor 12/13/2015 8:47 AM

## 2015-12-14 ENCOUNTER — Emergency Department (HOSPITAL_COMMUNITY)
Admission: EM | Admit: 2015-12-14 | Discharge: 2015-12-15 | Disposition: A | Discharge status: Home / Self Care | Payer: Medicaid Other | Attending: Emergency Medicine | Admitting: Emergency Medicine

## 2015-12-14 ENCOUNTER — Emergency Department (HOSPITAL_COMMUNITY): Payer: Medicaid Other

## 2015-12-14 ENCOUNTER — Encounter (HOSPITAL_COMMUNITY): Payer: Self-pay | Admitting: Oncology

## 2015-12-14 DIAGNOSIS — F17210 Nicotine dependence, cigarettes, uncomplicated: Secondary | ICD-10-CM | POA: Insufficient documentation

## 2015-12-14 DIAGNOSIS — J449 Chronic obstructive pulmonary disease, unspecified: Secondary | ICD-10-CM | POA: Diagnosis not present

## 2015-12-14 DIAGNOSIS — R4182 Altered mental status, unspecified: Secondary | ICD-10-CM | POA: Diagnosis present

## 2015-12-14 DIAGNOSIS — F10229 Alcohol dependence with intoxication, unspecified: Secondary | ICD-10-CM | POA: Insufficient documentation

## 2015-12-14 DIAGNOSIS — F329 Major depressive disorder, single episode, unspecified: Secondary | ICD-10-CM | POA: Insufficient documentation

## 2015-12-14 DIAGNOSIS — Z7982 Long term (current) use of aspirin: Secondary | ICD-10-CM | POA: Insufficient documentation

## 2015-12-14 DIAGNOSIS — F10929 Alcohol use, unspecified with intoxication, unspecified: Secondary | ICD-10-CM

## 2015-12-14 DIAGNOSIS — F1020 Alcohol dependence, uncomplicated: Secondary | ICD-10-CM

## 2015-12-14 LAB — CBG MONITORING, ED: GLUCOSE-CAPILLARY: 84 mg/dL (ref 65–99)

## 2015-12-14 NOTE — ED Provider Notes (Signed)
CSN: [id number]     Arrival date & time 12/14/15  2205 History   First MD Initiated Contact with Patient 12/14/15 2228     Chief Complaint  Patient presents with  . Seizures     (Consider location/radiation/quality/duration/timing/severity/associated sxs/prior Treatment) HPI   Samuel Reyes is a 60 y.o. male brought in by EMS for possible seizure and altered mental status. He was found lying in a Alley, holding a beer. There is no history that he was seen shaking. Using the ED, 2 days ago intoxicated. He is unable to give any history.  Level V caveat- altered mental status  Past Medical History  Diagnosis Date  . COPD (chronic obstructive pulmonary disease) ([place])   . GERD (gastroesophageal reflux disease)   . Depression   . Headache    Past Surgical History  Procedure Laterality Date  . Gastrectomy    . Shoulder surgery Bilateral     3 surgeries on on left, 2 surgeries on right   . Rt knee arthroscopic surgery    . Back surgery      3 cervical spine surgeries C4-C5 fused  . Hernia repair    . Finger surgery Left     2nd, 3rd, & 4th fingers were cut off by table saw and reattached  . Colonoscopy N/A 01/04/2014    Procedure: COLONOSCOPY;  Surgeon: Danie Binder, MD;  [facility];  Service: Endoscopy;  Laterality: N/A;  1:45  . Esophagogastroduodenoscopy N/A 01/04/2014    Procedure: ESOPHAGOGASTRODUODENOSCOPY (EGD);  Surgeon: Danie Binder, MD;  [facility];  Service: Endoscopy;  Laterality: N/A;  . Incisional hernia repair N/A 01/20/2014    Procedure: LAPAROSCOPIC RECURRENT  INCISIONAL HERNIA with mesh;  Surgeon: Edward Jolly, MD;  Location: [facility];  Service: General;  Laterality: N/A;   Family History  Problem Relation Age of Onset  . Cancer Father     bone  . Cancer Brother     lungs  . Stroke Maternal Grandmother   . Colon cancer Neg Hx   . Asthma Son     died at age 38 in his sleep   . Spina bifida Son     died at age 69   . Dementia  Mother    Social History  Substance Use Topics  . Smoking status: Current Every Day Smoker -- 1.00 packs/day for 45 years    Types: Cigarettes    Start date: 07/30/1966  . Smokeless tobacco: Never Used  . Alcohol Use: Yes     Comment: 'tries to drink every day' ETOH abuse    Review of Systems  Unable to perform ROS: Mental status change      Allergies  Bee venom and Penicillins  Home Medications   Prior to Admission medications   Medication Sig Start Date End Date Taking? Authorizing Provider  albuterol (PROVENTIL HFA;VENTOLIN HFA) 108 (90 Base) MCG/ACT inhaler Inhale 1-2 puffs into the lungs every 4 (four) hours as needed for wheezing. 11/07/15  Yes Encarnacion Slates, NP  Aspirin-Acetaminophen-Caffeine (GOODY HEADACHE PO) Take 3 packets by mouth daily as needed (headaches).   Yes Historical Provider, MD  buPROPion (WELLBUTRIN SR) 200 MG 12 hr tablet Take 1 tablet (200 mg total) by mouth at bedtime. 12/13/15  Yes Niel Hummer, NP  folic acid (FOLVITE) 1 MG tablet Take 1 tablet (1 mg total) by mouth daily. 12/13/15  Yes Niel Hummer, NP  gabapentin (NEURONTIN) 100 MG capsule Take 1 capsule (100  mg total) by mouth 3 (three) times daily. For agitation/alcohol withdrawal syndrome 12/13/15  Yes Niel Hummer, NP  metoprolol tartrate (LOPRESSOR) 25 MG tablet Take 1 tablet (25 mg total) by mouth daily before breakfast. For high blood pressure 11/07/15  Yes Encarnacion Slates, NP  nicotine (NICODERM CQ - DOSED IN MG/24 HOURS) 21 mg/24hr patch Place 1 patch (21 mg total) onto the skin daily. For nicotine addiction 11/07/15   Encarnacion Slates, NP  thiamine 100 MG tablet Take 1 tablet (100 mg total) by mouth daily. 12/13/15   Niel Hummer, NP  traZODone (DESYREL) 100 MG tablet Take 1 tablet (100 mg total) by mouth at bedtime as needed for sleep. Patient not taking: Reported on 12/14/2015 12/13/15   Niel Hummer, NP   BP 109/78 mmHg  Pulse 100  Temp(Src) 98.9 F (37.2 C) (Oral)  Resp 16  SpO2  99% Physical Exam  Constitutional: He appears well-developed. No distress.  Disheveled  HENT:  Head: Normocephalic and atraumatic.  Right Ear: External ear normal.  Left Ear: External ear normal.  Tender right forehead, without contusion or crepitation or laceration.  Eyes: Conjunctivae and EOM are normal. Pupils are equal, round, and reactive to light.  Neck: Normal range of motion and phonation normal. Neck supple.  He is wearing a cervical collar, which was applied, by EMS.  Cardiovascular: Normal rate, regular rhythm and normal heart sounds.   Pulmonary/Chest: Effort normal and breath sounds normal. He exhibits no bony tenderness.  Abdominal: Soft. There is no tenderness.  Musculoskeletal: Normal range of motion.  Neurological: He is alert. No cranial nerve deficit or sensory deficit. He exhibits normal muscle tone. Coordination normal.  Oriented only to person. He thinks he is in [place], [place].  Skin: Skin is warm, dry and intact.  Psychiatric: He has a normal mood and affect.  Nursing note and vitals reviewed.   ED Course  Procedures (including critical care time)  Clinical impression.- Recurrent alcohol intoxication in an alcoholic patient. Doubt seizure, serious injury, or impending vascular collapse. He will be evaluated with images. He will likely need to sober up before being discharged.  Labs Review Labs Reviewed  CBG MONITORING, ED    Imaging Review Ct Head Wo Contrast  12/14/2015  CLINICAL DATA:  Altered mental status, possible seizure, ethanol intake, found lying in an alley, smoker, COPD, hypertension EXAM: CT HEAD WITHOUT CONTRAST CT CERVICAL SPINE WITHOUT CONTRAST TECHNIQUE: Multidetector CT imaging of the head and cervical spine was performed following the standard protocol without intravenous contrast. Multiplanar CT image reconstructions of the cervical spine were also generated. COMPARISON:  11/15/2015 FINDINGS: CT HEAD FINDINGS Generalized atrophy. Normal  ventricular morphology. No midline shift or mass effect. Otherwise normal appearance of brain parenchyma. No intracranial hemorrhage, mass lesion or evidence acute infarction. No extra-axial fluid collections. Visualized paranasal sinuses and mastoid air cells clear. Skull intact. CT CERVICAL SPINE FINDINGS Prevertebral soft tissues normal thickness. Prior C5-C6 fusion. Mild concavity at superior endplate of T1, unchanged. Vertebral body heights otherwise maintained. Scattered multilevel disc space narrowing with endplate spur formation greatest at C3-C4 and C4-C5. No acute fracture, subluxation, or bone destruction. Visualized skullbase intact. Lung apices clear. IMPRESSION: Generalized atrophy. No acute intracranial abnormalities. Scattered degenerative disc disease changes cervical spine with evidence of prior C5-C6 fusion and chronic superior endplate height loss of T1 vertebral body. No acute cervical spine abnormalities. Electronically Signed   By: Lavonia Dana M.D.   On: 12/14/2015 23:09   Ct  Cervical Spine Wo Contrast  12/14/2015  CLINICAL DATA:  Altered mental status, possible seizure, ethanol intake, found lying in an alley, smoker, COPD, hypertension EXAM: CT HEAD WITHOUT CONTRAST CT CERVICAL SPINE WITHOUT CONTRAST TECHNIQUE: Multidetector CT imaging of the head and cervical spine was performed following the standard protocol without intravenous contrast. Multiplanar CT image reconstructions of the cervical spine were also generated. COMPARISON:  11/15/2015 FINDINGS: CT HEAD FINDINGS Generalized atrophy. Normal ventricular morphology. No midline shift or mass effect. Otherwise normal appearance of brain parenchyma. No intracranial hemorrhage, mass lesion or evidence acute infarction. No extra-axial fluid collections. Visualized paranasal sinuses and mastoid air cells clear. Skull intact. CT CERVICAL SPINE FINDINGS Prevertebral soft tissues normal thickness. Prior C5-C6 fusion. Mild concavity at superior  endplate of T1, unchanged. Vertebral body heights otherwise maintained. Scattered multilevel disc space narrowing with endplate spur formation greatest at C3-C4 and C4-C5. No acute fracture, subluxation, or bone destruction. Visualized skullbase intact. Lung apices clear. IMPRESSION: Generalized atrophy. No acute intracranial abnormalities. Scattered degenerative disc disease changes cervical spine with evidence of prior C5-C6 fusion and chronic superior endplate height loss of T1 vertebral body. No acute cervical spine abnormalities. Electronically Signed   By: Lavonia Dana M.D.   On: 12/14/2015 23:09   I have personally reviewed and evaluated these images and lab results as part of my medical decision-making.   EKG Interpretation None      MDM   Final diagnoses:  Alcohol intoxication, with unspecified complication ([place])  Alcoholism ([place])    Alcoholism with alcohol intoxication.   Nursing Notes Reviewed/ Care Coordinated, and agree without changes. Applicable Imaging Reviewed.  Interpretation of Laboratory Data incorporated into ED treatment  Disposition- as per oncoming provider team    Daleen Bo, MD 12/14/15 2316

## 2015-12-14 NOTE — ED Notes (Signed)
Per EMS pt presents d/t AMS, possible seizure, and ETOH.  Pt was found lying in an ally w/ a 40 oz. Beer.  Pt was oriented to person only.  Could tell EMS that he has a hx of seizures however has not taken his medication x 2 months.  Pt did urinate on himself PTA.

## 2015-12-14 NOTE — ED Provider Notes (Signed)
Patient signed out as pending CT head for evaluation of possible fall while intoxicated.  CT results show no acute injury.  Will observe in the ED until clinically sober or until a reliable companion can pick him up.  6:40 AM Patient ambulated and is alert and oriented.  He is clinically sober and safe for DC home.  [name], MD 12/15/15 [phone]

## 2015-12-14 NOTE — ED Notes (Signed)
Bed: [id number] Expected date:  Expected time:  Means of arrival:  Comments: EMS altered mental status/ETOH-seizures-has not been on meds/immobilized

## 2015-12-14 NOTE — Discharge Instructions (Signed)
Alcohol Intoxication Samuel Reyes, see a primary care doctor within 3 days to help quit your alcohol abuse.  If any symptoms worsen, come back to the ED immediately. Thank you. Alcohol intoxication occurs when you drink enough alcohol that it affects your ability to function. It can be mild or very severe. Drinking a lot of alcohol in a short time is called binge drinking. This can be very harmful. Drinking alcohol can also be more dangerous if you are taking medicines or other drugs. Some of the effects caused by alcohol may include:  Loss of coordination.  Changes in mood and behavior.  Unclear thinking.  Trouble talking (slurred speech).  Throwing up (vomiting).  Confusion.  Slowed breathing.  Twitching and shaking (seizures).  Loss of consciousness. HOME CARE  Do not drive after drinking alcohol.  Drink enough water and fluids to keep your pee (urine) clear or pale yellow. Avoid caffeine.  Only take medicine as told by your doctor. GET HELP IF:  You throw up (vomit) many times.  You do not feel better after a few days.  You frequently have alcohol intoxication. Your doctor can help decide if you should see a substance use treatment counselor. GET HELP RIGHT AWAY IF:  You become shaky when you stop drinking.  You have twitching and shaking.  You throw up blood. It may look bright red or like coffee grounds.  You notice blood in your poop (bowel movements).  You become lightheaded or pass out (faint). MAKE SURE YOU:   Understand these instructions.  Will watch your condition.  Will get help right away if you are not doing well or get worse.   This information is not intended to replace advice given to you by your health care provider. Make sure you discuss any questions you have with your health care provider.   Document Released: 01/02/2008 Document Revised: 03/18/2013 Document Reviewed: 12/19/2012 Elsevier Interactive Patient Education [employer].

## 2015-12-15 NOTE — ED Notes (Signed)
Pt ambulated w/o assistance w/ a steady gait.

## 2016-01-10 ENCOUNTER — Telehealth: Payer: Self-pay | Admitting: Internal Medicine

## 2016-01-10 ENCOUNTER — Ambulatory Visit: Payer: Medicaid Other | Attending: Internal Medicine | Admitting: Internal Medicine

## 2016-01-10 ENCOUNTER — Encounter: Payer: Self-pay | Admitting: Internal Medicine

## 2016-01-10 VITALS — BP 135/87 | HR 108 | Temp 98.4°F | Wt 145.0 lb

## 2016-01-10 DIAGNOSIS — M179 Osteoarthritis of knee, unspecified: Secondary | ICD-10-CM

## 2016-01-10 DIAGNOSIS — IMO0001 Reserved for inherently not codable concepts without codable children: Secondary | ICD-10-CM

## 2016-01-10 DIAGNOSIS — N63 Unspecified lump in unspecified breast: Secondary | ICD-10-CM

## 2016-01-10 DIAGNOSIS — Z803 Family history of malignant neoplasm of breast: Secondary | ICD-10-CM

## 2016-01-10 DIAGNOSIS — J449 Chronic obstructive pulmonary disease, unspecified: Secondary | ICD-10-CM

## 2016-01-10 DIAGNOSIS — F329 Major depressive disorder, single episode, unspecified: Secondary | ICD-10-CM

## 2016-01-10 DIAGNOSIS — Z720 Tobacco use: Secondary | ICD-10-CM

## 2016-01-10 DIAGNOSIS — F1020 Alcohol dependence, uncomplicated: Secondary | ICD-10-CM

## 2016-01-10 DIAGNOSIS — M1711 Unilateral primary osteoarthritis, right knee: Secondary | ICD-10-CM

## 2016-01-10 DIAGNOSIS — F32A Depression, unspecified: Secondary | ICD-10-CM

## 2016-01-10 MED ORDER — IPRATROPIUM-ALBUTEROL 18-103 MCG/ACT IN AERO
2.0000 | INHALATION_SPRAY | Freq: Four times a day (QID) | RESPIRATORY_TRACT | Status: DC | PRN
Start: 2016-01-10 — End: 2016-08-27

## 2016-01-10 NOTE — Telephone Encounter (Signed)
Patient medication needs to be switched to a medicaid prescriber

## 2016-01-10 NOTE — Progress Notes (Signed)
Samuel Reyes, is a 60 y.o. male  [id number]  [id number]  DOB - 08-08-1955  CC:  Chief Complaint  Patient presents with  . Establish Care  . Breast Mass    right side x 5 weeks - very tender       HPI: Samuel Reyes is a 60 y.o. male here today to establish medical care for copd, etoh and tobacco abuse, last seen in clinic 5/16.  Pt s/p recent hospitalization 5/15-16/17 for Alcoholism and SI., he was seen in ED 12/14/15 for etoh intox and possible fall, he than signed out AMA.  Says since discharge, he states he is doing better.  He is staying with his mother.  He states he only had a small sip of etoh about 1 month ago, and since than none.  He has been going to AA mtgs.  He also smoking 2-3 cigarrettes/ week now, and makes sure not to be on nicoderm patch when smoking.  Long tob hx, about 45year.  Pt today c/o of right breast lump, for past 5-6wks, ttp.  No fevers. No c/cough.  Feels his copd is well controlled on Combivent and albuterol at home.  Pt has strong cancer hx. Sister was dx w/ breast cancer at 18yo and diet 3 months later to breast cancer.  His father had bone cancer, mother still alive - no known cancer.  Pt also c/o of right knee pain/DJD, s/p right knee arthoscopy years ago, but his knee gives out sometimes when walking.  Patient has No headache, No chest pain, No abdominal pain - No Nausea, No new weakness tingling or numbness, No Cough - SOB.  No si/hi/avh currently.  * patient's son passed away when 13yo, found unresponsive in bed, per pt, felt due to asthma.  Pt's wife passed away 3 years later due to Crohn's.  Allergies  Allergen Reactions  . Bee Venom Anaphylaxis  . Penicillins Rash    Has patient had a PCN reaction causing immediate rash, facial/tongue/throat swelling, SOB or lightheadedness with hypotension: {Yes Has patient had a PCN reaction causing severe rash involving mucus membranes or skin necrosis: NO Has patient had a PCN reaction  that required hospitalization {Yes Has patient had a PCN reaction occurring within the last 10 years: NO If all of the above answers are "NO", then may proceed with Cephalosporin use.    Past Medical History  Diagnosis Date  . COPD (chronic obstructive pulmonary disease) ([place])   . GERD (gastroesophageal reflux disease)   . Depression   . Headache     PShx: 3 prior surgeries to Left shoulder, 2x surgery to Left Shoulder, 3 spine surgeries in Cervical region, gastric perforation requiring open surg when he was 60yo, right knee arthroscopy.  Current Outpatient Prescriptions on File Prior to Visit  Medication Sig Dispense Refill  . albuterol (PROVENTIL HFA;VENTOLIN HFA) 108 (90 Base) MCG/ACT inhaler Inhale 1-2 puffs into the lungs every 4 (four) hours as needed for wheezing. 1 Inhaler 1  . Aspirin-Acetaminophen-Caffeine (GOODY HEADACHE PO) Take 3 packets by mouth daily as needed (headaches).    Marland Kitchen buPROPion (WELLBUTRIN SR) 200 MG 12 hr tablet Take 1 tablet (200 mg total) by mouth at bedtime. 30 tablet 0  . folic acid (FOLVITE) 1 MG tablet Take 1 tablet (1 mg total) by mouth daily. 30 tablet 2  . gabapentin (NEURONTIN) 100 MG capsule Take 1 capsule (100 mg total) by mouth 3 (three) times daily. For agitation/alcohol withdrawal syndrome 90 capsule 0  . metoprolol  tartrate (LOPRESSOR) 25 MG tablet Take 1 tablet (25 mg total) by mouth daily before breakfast. For high blood pressure 1 tablet 0  . nicotine (NICODERM CQ - DOSED IN MG/24 HOURS) 21 mg/24hr patch Place 1 patch (21 mg total) onto the skin daily. For nicotine addiction 28 patch 0  . thiamine 100 MG tablet Take 1 tablet (100 mg total) by mouth daily. 30 tablet 2  . traZODone (DESYREL) 100 MG tablet Take 1 tablet (100 mg total) by mouth at bedtime as needed for sleep. 30 tablet 0   No current facility-administered medications on file prior to visit.   Family History  Problem Relation Age of Onset  . Cancer Father     bone  . Cancer  Brother     lungs  . Stroke Maternal Grandmother   . Colon cancer Neg Hx   . Asthma Son     died at age 16 in his sleep   . Spina bifida Son     died at age 63   . Dementia Mother    Social History   Social History  . Marital Status: Widowed    Spouse Name: N/A  . Number of Children: N/A  . Years of Education: N/A   Occupational History  . Disability    Social History Main Topics  . Smoking status: Current Every Day Smoker -- 1.00 packs/day for 45 years    Types: Cigarettes    Start date: 07/30/1966  . Smokeless tobacco: Never Used  . Alcohol Use: Yes     Comment: 'tries to drink every day' ETOH abuse  . Drug Use: No     Comment: denid using any drugs  . Sexual Activity: No   Other Topics Concern  . Not on file   Social History Narrative      Objective:   Filed Vitals:   01/10/16 1456  BP: 135/87  Pulse: 108  Temp: 98.4 F (36.9 C)    Filed Weights   01/10/16 1456  Weight: 145 lb (65.772 kg)    BP Readings from Last 3 Encounters:  01/10/16 135/87  12/15/15 105/78  12/13/15 127/88    Physical Exam: Constitutional: Patient appears well-developed and well-nourished. No distress. AAOx3, pleasant.  HENT: Normocephalic, atraumatic, External right and left ear normal. Oropharynx is clear and moist Eyes: Conjunctivae and EOM are normal. PERRL, no scleral icterus. Neck: Normal ROM. Neck supple.  CVS: RRR, S1/S2 +, no murmurs, no gallops, no carotid bruit.  Breast exam: small 2cm mass noted at 11 O'clock on right breast, ttp, slightly mobile.   No masses on left breast/axilla. No lad. Pulmonary: Effort and breath sounds normal, no stridor, rhonchi, wheezes, rales.  Abdominal: Soft. BS +, well healing midline abd scar, no distension, tenderness, rebound or guarding.  Musculoskeletal: Normal range of motion. No edema and no tenderness.  LE: bilat/ no c/c/e, pulses 2+ bilateral. Neuro: Alert. Normal  muscle tone coordination. No cranial nerve deficit  grossly. Skin: Skin is warm and dry. No rash noted. Not diaphoretic. No erythema. No pallor. Psychiatric: Normal mood and affect. Behavior, judgment, thought content normal.  Lab Results  Component Value Date   WBC 12.4* 12/11/2015   HGB 14.5 12/11/2015   HCT 43.6 12/11/2015   MCV 83.4 12/11/2015   PLT 317 12/11/2015   Lab Results  Component Value Date   CREATININE 0.75 12/11/2015   BUN 12 12/11/2015   NA 139 12/11/2015   K 4.1 12/11/2015   CL 105 12/11/2015  CO2 21* 12/11/2015    Lab Results  Component Value Date   HGBA1C 5.9* 11/16/2015   Lipid Panel     Component Value Date/Time   CHOL 247* 02/17/2013 1541   TRIG 144 02/17/2013 1541   HDL 79 02/17/2013 1541   CHOLHDL 3.1 02/17/2013 1541   VLDL 29 02/17/2013 1541   LDLCALC 139* 02/17/2013 1541       Depression screen PHQ 2/9 01/10/2016 06/15/2013  Decreased Interest 1 3  Down, Depressed, Hopeless 2 2  PHQ - 2 Score 3 5  Altered sleeping 3 3  Tired, decreased energy 2 3  Change in appetite 2 3  Feeling bad or failure about yourself  2 2  Trouble concentrating 1 2  Moving slowly or fidgety/restless 2 2  Suicidal thoughts 2 0  PHQ-9 Score 17 20  Difficult doing work/chores Very difficult -    Assessment and plan:   1. Breast mass in male, right, 9 O'clock, with strong family hx  Of breast cancer. - per pt, sister dx w/ breast cancer at 19yo and died 3 months later. - MM DIAG BREAST TOMO BILATERAL; Future  2. Chronic obstructive pulmonary disease, unspecified COPD type ([place]) - appears well controlled, rx combivent prn. - CT Chest High Resolution; Future  - r/o lung cancer given the mass finding on exam and strong tob hx.  3. Alcoholism /alcohol abuse ([place]) - no etoh pretty much for last 1-2 months per pt, cont to encourage abstinence. - cont aa mtgs.  4. Depression Doing well on welbutrin, no si/hi.  5. Osteoarthritis of right knee, unspecified osteoarthritis type Prior arthroscopy, but pt states  his knee is giving out sometimes, severe pain w/ ambulation/ - Ambulatory referral to Orthopedic Surgery  6. Tobacco abuse Down to 2-3 cigs/week, encouraged complete cessation; nicoderm patch at home.  7. Family history of breast cancer in first degree relative See #1   Return in about 4 weeks (around 02/07/2016).  The patient was given clear instructions to go to ER or return to medical center if symptoms don't improve, worsen or new problems develop. The patient verbalized understanding. The patient was told to call to get lab results if they haven't heard anything in the next week.      Maren Reamer, MD, [place], [place]   01/10/2016, 3:24 PM

## 2016-01-10 NOTE — Patient Instructions (Signed)
You Can Quit Smoking  If you are ready to quit smoking or are thinking about it, congratulations! You have chosen to help yourself be healthier and live longer! There are lots of different ways to quit smoking. Nicotine gum, nicotine patches, a nicotine inhaler, or nicotine nasal spray can help with physical craving. Hypnosis, support groups, and medicines help break the habit of smoking.  TIPS TO GET OFF AND STAY OFF CIGARETTES  · Learn to predict your moods. Do not let a bad situation be your excuse to have a cigarette. Some situations in your life might tempt you to have a cigarette.  · Ask friends and co-workers not to smoke around you.  · Make your home smoke-free.  · Never have "just one" cigarette. It leads to wanting another and another. Remind yourself of your decision to quit.  · On a card, make a list of your reasons for not smoking. Read it at least the same number of times a day as you have a cigarette. Tell yourself everyday, "I do not want to smoke. I choose not to smoke."  · Ask someone at home or work to help you with your plan to quit smoking.  · Have something planned after you eat or have a cup of coffee. Take a walk or get other exercise to perk you up. This will help to keep you from overeating.  · Try a relaxation exercise to calm you down and decrease your stress. Remember, you may be tense and nervous the first two weeks after you quit. This will pass.  · Find new activities to keep your hands busy. Play with a pen, coin, or rubber band. Doodle or draw things on paper.  · Brush your teeth right after eating. This will help cut down the craving for the taste of tobacco after meals. You can try mouthwash too.  · Try gum, breath mints, or diet candy to keep something in your mouth.  IF YOU SMOKE AND WANT TO QUIT:  · Do not stock up on cigarettes. Never buy a carton. Wait until one pack is finished before you buy another.  · Never carry cigarettes with you at work or at home.  · Keep cigarettes  as far away from you as possible. Leave them with someone else.  · Never carry matches or a lighter with you.  · Ask yourself, "Do I need this cigarette or is this just a reflex?"  · Bet with someone that you can quit. Put cigarette money in a piggy bank every morning. If you smoke, you give up the money. If you do not smoke, by the end of the week, you keep the money.  · Keep trying. It takes 21 days to change a habit!  · Talk to your doctor about using medicines to help you quit. These include nicotine replacement gum, lozenges, or skin patches.     This information is not intended to replace advice given to you by your health care provider. Make sure you discuss any questions you have with your health care provider.     Document Released: 05/12/2009 Document Revised: 10/08/2011 Document Reviewed: 05/12/2009  Elsevier Interactive Patient Education ©2016 [employer]

## 2016-01-16 ENCOUNTER — Other Ambulatory Visit: Payer: Self-pay | Admitting: Internal Medicine

## 2016-01-16 DIAGNOSIS — R222 Localized swelling, mass and lump, trunk: Secondary | ICD-10-CM

## 2016-01-16 NOTE — Telephone Encounter (Signed)
Pt. Called stating that his PCP referred him to have a CT chest x-ray done. Pt. Stated that  He received a letter from [employer] stating that they denied his request. Pt. Has medicaid. Please f/u with pt.    [employer]: [phone]

## 2016-01-18 NOTE — Telephone Encounter (Signed)
-----   Message from Maren Reamer, MD sent at 01/16/2016  1:21 PM EDT ----- Regarding: FW: CT CHEST  denied, I ordered cxr first Please call pt, medicaid denied his ct chest, so I put in order for 2 view cxr to start with. Please get him to get walkin cxr soon. Thanks  ----- Message -----    From: Ena Dawley    Sent: 01/13/2016   9:41 AM      To: Maren Reamer, MD Subject: CT CHEST                                       Good Morning  Medicaid Denied the procedure . If you  Want you can request an appear to appear  [id number] [phone] Case# [id number]   Based on [employer] Preface Imaging Guidelines, we are unable to approve the requested procedure. The clinical information provided does not describe a need for the currently requested study (study type, study contrast level, or modality). Advanced imaging is not supported without this information and, therefore, the requested study is not indicated at this time.

## 2016-01-18 NOTE — Telephone Encounter (Signed)
Clld pt - [place] re need for 2 view cxr.

## 2016-01-19 NOTE — Telephone Encounter (Signed)
Clld pt - advsd of PCP's notation; need for him to go to [facility] to have 2V CXR done. Pt stated he understood and would go. Confirmed with patient he could go anytime of the day but he needed to go soon. Pt stated he would.

## 2016-01-19 NOTE — Telephone Encounter (Signed)
Pt. Returned call. Please f/u with pt. °

## 2016-01-20 ENCOUNTER — Ambulatory Visit (HOSPITAL_COMMUNITY)
Admission: RE | Admit: 2016-01-20 | Discharge: 2016-01-20 | Disposition: A | Discharge status: Home / Self Care | Payer: Medicaid Other | Source: Ambulatory Visit | Attending: Internal Medicine | Admitting: Internal Medicine

## 2016-01-20 DIAGNOSIS — R222 Localized swelling, mass and lump, trunk: Secondary | ICD-10-CM | POA: Diagnosis present

## 2016-01-27 ENCOUNTER — Ambulatory Visit: Payer: Medicaid Other | Admitting: Family Medicine

## 2016-07-08 IMAGING — CR DG CHEST 2V
2 series · 2 of 2 positions shown · non-contrast
Comparison: 01/29/2013

CLINICAL DATA: Left-sided chest pain.

EXAM:
CHEST  2 VIEW

[w chest pa]
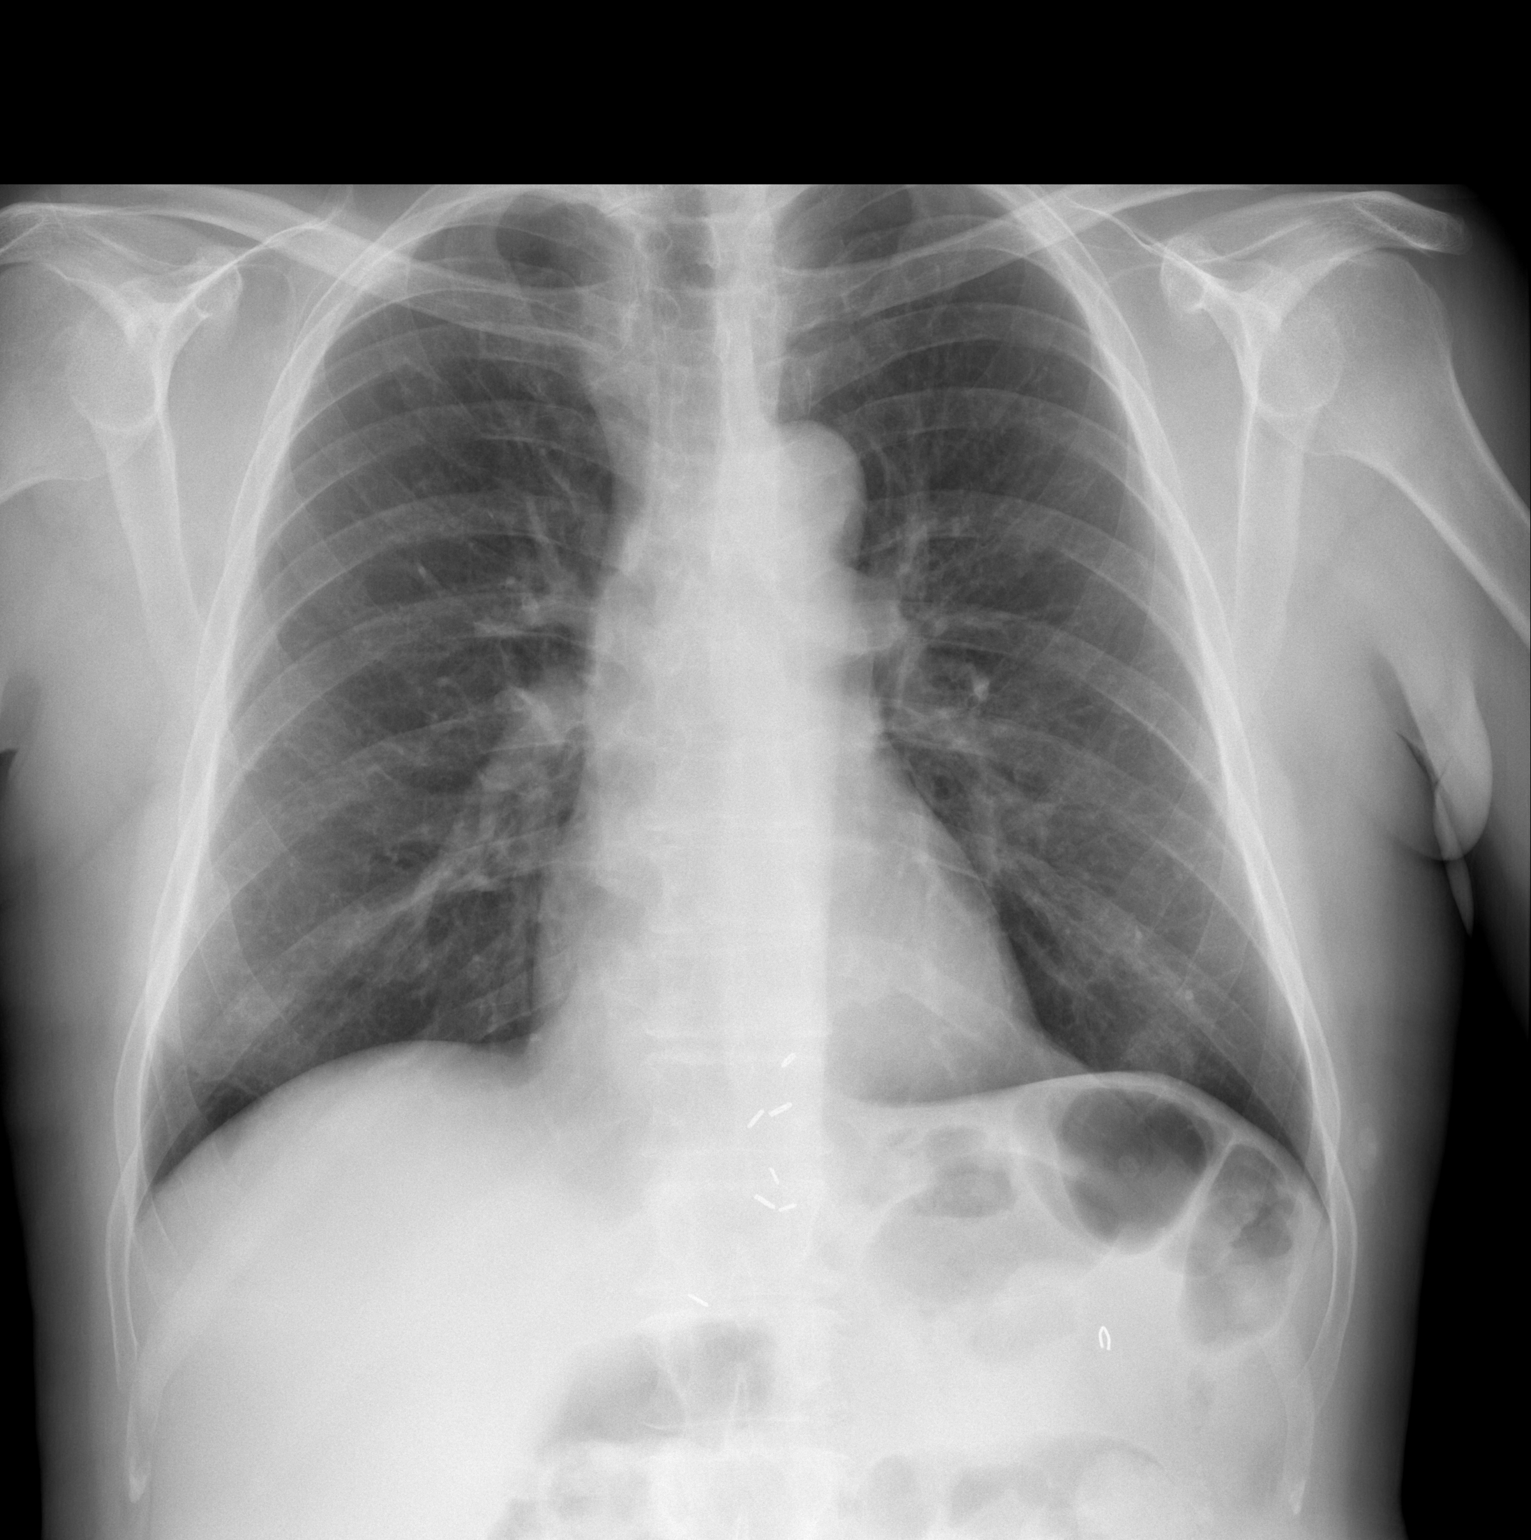

[w chest lat]
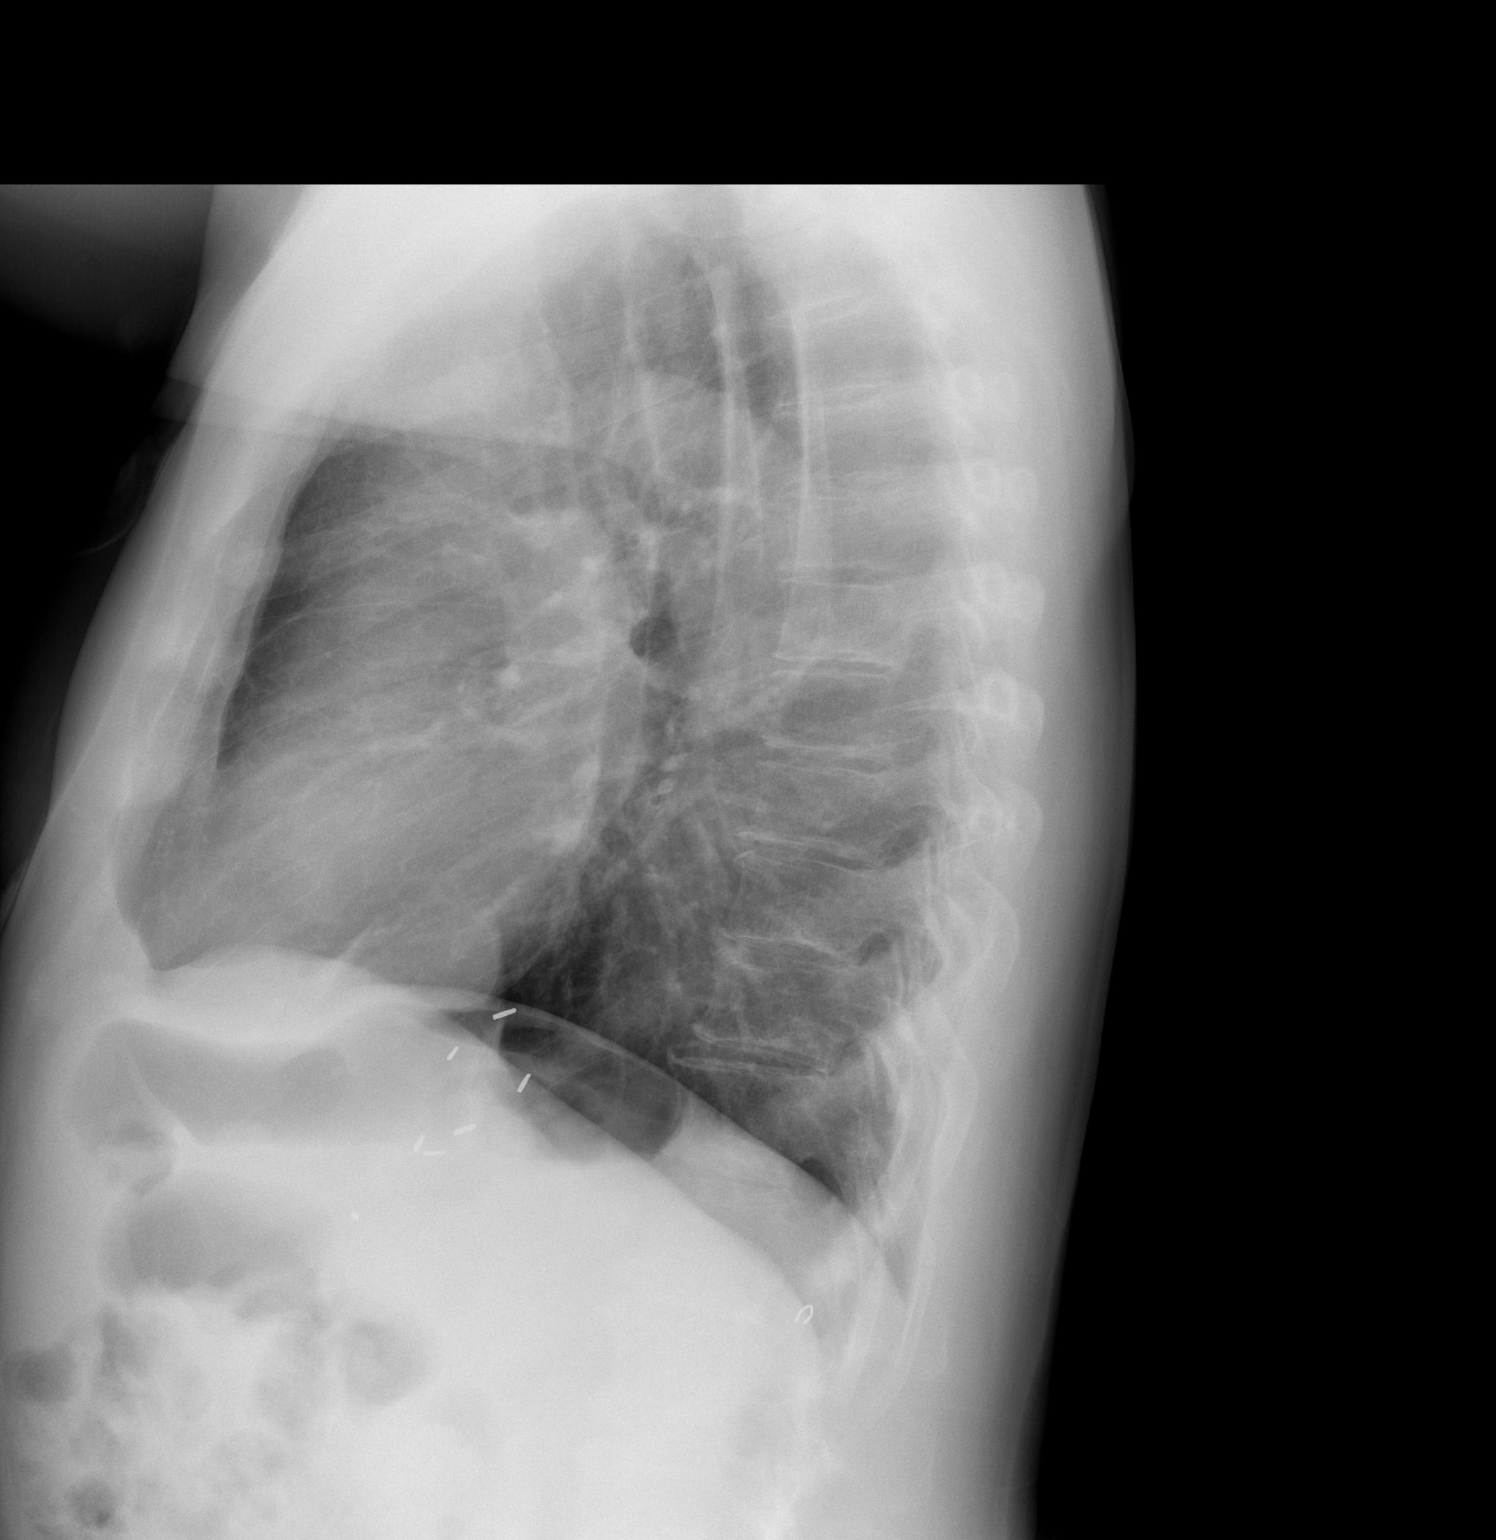

[2 of 2 positions shown; findings below may reference images not displayed]

FINDINGS: Lungs are adequately inflated without consolidation or effusion.
Cardiomediastinal silhouette and remainder of the exam is unchanged.
IMPRESSION: No active cardiopulmonary disease.

## 2016-08-02 ENCOUNTER — Encounter (HOSPITAL_COMMUNITY): Payer: Self-pay | Admitting: Emergency Medicine

## 2016-08-02 ENCOUNTER — Emergency Department (HOSPITAL_COMMUNITY): Payer: Medicaid Other

## 2016-08-02 ENCOUNTER — Inpatient Hospital Stay (HOSPITAL_COMMUNITY)
Admission: EM | Admit: 2016-08-02 | Discharge: 2016-08-06 | DRG: 189 | Disposition: A | Discharge status: Home / Self Care | Payer: Medicaid Other | Attending: Internal Medicine | Admitting: Internal Medicine

## 2016-08-02 DIAGNOSIS — Z808 Family history of malignant neoplasm of other organs or systems: Secondary | ICD-10-CM

## 2016-08-02 DIAGNOSIS — J9601 Acute respiratory failure with hypoxia: Secondary | ICD-10-CM | POA: Diagnosis present

## 2016-08-02 DIAGNOSIS — I471 Supraventricular tachycardia: Secondary | ICD-10-CM | POA: Diagnosis present

## 2016-08-02 DIAGNOSIS — F17210 Nicotine dependence, cigarettes, uncomplicated: Secondary | ICD-10-CM | POA: Diagnosis present

## 2016-08-02 DIAGNOSIS — Z6824 Body mass index (BMI) 24.0-24.9, adult: Secondary | ICD-10-CM

## 2016-08-02 DIAGNOSIS — Z801 Family history of malignant neoplasm of trachea, bronchus and lung: Secondary | ICD-10-CM

## 2016-08-02 DIAGNOSIS — F1094 Alcohol use, unspecified with alcohol-induced mood disorder: Secondary | ICD-10-CM | POA: Diagnosis not present

## 2016-08-02 DIAGNOSIS — R7302 Impaired glucose tolerance (oral): Secondary | ICD-10-CM | POA: Diagnosis present

## 2016-08-02 DIAGNOSIS — F10229 Alcohol dependence with intoxication, unspecified: Secondary | ICD-10-CM | POA: Diagnosis present

## 2016-08-02 DIAGNOSIS — E440 Moderate protein-calorie malnutrition: Secondary | ICD-10-CM | POA: Diagnosis present

## 2016-08-02 DIAGNOSIS — F1020 Alcohol dependence, uncomplicated: Secondary | ICD-10-CM

## 2016-08-02 DIAGNOSIS — R531 Weakness: Secondary | ICD-10-CM

## 2016-08-02 DIAGNOSIS — F1024 Alcohol dependence with alcohol-induced mood disorder: Secondary | ICD-10-CM | POA: Diagnosis present

## 2016-08-02 DIAGNOSIS — IMO0001 Reserved for inherently not codable concepts without codable children: Secondary | ICD-10-CM | POA: Diagnosis present

## 2016-08-02 DIAGNOSIS — R0789 Other chest pain: Secondary | ICD-10-CM | POA: Diagnosis present

## 2016-08-02 DIAGNOSIS — J441 Chronic obstructive pulmonary disease with (acute) exacerbation: Secondary | ICD-10-CM | POA: Diagnosis present

## 2016-08-02 DIAGNOSIS — Z825 Family history of asthma and other chronic lower respiratory diseases: Secondary | ICD-10-CM

## 2016-08-02 DIAGNOSIS — R9431 Abnormal electrocardiogram [ECG] [EKG]: Secondary | ICD-10-CM | POA: Diagnosis not present

## 2016-08-02 DIAGNOSIS — R0602 Shortness of breath: Secondary | ICD-10-CM | POA: Diagnosis not present

## 2016-08-02 LAB — CBC WITH DIFFERENTIAL/PLATELET
Basophils Absolute: 0 10*3/uL (ref 0.0–0.1)
Basophils Relative: 0 %
Eosinophils Absolute: 0 10*3/uL (ref 0.0–0.7)
Eosinophils Relative: 0 %
HCT: 34.1 % — ABNORMAL LOW (ref 39.0–52.0)
HEMOGLOBIN: 11 g/dL — AB (ref 13.0–17.0)
LYMPHS ABS: 1.7 10*3/uL (ref 0.7–4.0)
LYMPHS PCT: 22 %
MCH: 23.4 pg — AB (ref 26.0–34.0)
MCHC: 32.3 g/dL (ref 30.0–36.0)
MCV: 72.4 fL — AB (ref 78.0–100.0)
MONO ABS: 0.8 10*3/uL (ref 0.1–1.0)
MONOS PCT: 10 %
NEUTROS PCT: 68 %
Neutro Abs: 5.2 10*3/uL (ref 1.7–7.7)
Platelets: 451 10*3/uL — ABNORMAL HIGH (ref 150–400)
RBC: 4.71 MIL/uL (ref 4.22–5.81)
RDW: 20.6 % — ABNORMAL HIGH (ref 11.5–15.5)
WBC: 7.7 10*3/uL (ref 4.0–10.5)

## 2016-08-02 LAB — COMPREHENSIVE METABOLIC PANEL
ALBUMIN: 3.7 g/dL (ref 3.5–5.0)
ALK PHOS: 97 U/L (ref 38–126)
ALT: 96 U/L — ABNORMAL HIGH (ref 17–63)
ANION GAP: 12 (ref 5–15)
AST: 124 U/L — ABNORMAL HIGH (ref 15–41)
BUN: <5 mg/dL — ABNORMAL LOW (ref 6–20)
CALCIUM: 8.2 mg/dL — AB (ref 8.9–10.3)
CHLORIDE: 101 mmol/L (ref 101–111)
CO2: 22 mmol/L (ref 22–32)
Creatinine, Ser: 0.66 mg/dL (ref 0.61–1.24)
GFR calc non Af Amer: >60 mL/min (ref >60)
GLUCOSE: 94 mg/dL (ref 65–99)
POTASSIUM: 3.7 mmol/L (ref 3.5–5.1)
SODIUM: 135 mmol/L (ref 135–145)
Total Bilirubin: 0.1 mg/dL — ABNORMAL LOW (ref 0.3–1.2)
Total Protein: 7.1 g/dL (ref 6.5–8.1)

## 2016-08-02 LAB — D-DIMER, QUANTITATIVE (NOT AT ARMC): D DIMER QUANT: 0.62 ug{FEU}/mL — AB (ref 0.00–0.50)

## 2016-08-02 LAB — LIPASE, BLOOD: Lipase: 28 U/L (ref 11–51)

## 2016-08-02 LAB — TROPONIN I

## 2016-08-02 LAB — TSH: TSH: 0.365 u[IU]/mL (ref 0.350–4.500)

## 2016-08-02 LAB — ETHANOL: Alcohol, Ethyl (B): 199 mg/dL — ABNORMAL HIGH (ref <5)

## 2016-08-02 LAB — CK: CK TOTAL: 25 U/L — AB (ref 49–397)

## 2016-08-02 LAB — MRSA PCR SCREENING: MRSA by PCR: NEGATIVE

## 2016-08-02 MED ORDER — SODIUM CHLORIDE 0.9 % IV BOLUS (SEPSIS)
1000.0000 mL | Freq: Once | INTRAVENOUS | Status: AC
  Administered 2016-08-02: 1000 mL via INTRAVENOUS

## 2016-08-02 MED ORDER — THIAMINE HCL 100 MG/ML IJ SOLN
100.0000 mg | Freq: Every day | INTRAMUSCULAR | Status: DC
  Filled 2016-08-02 (×2): qty 2

## 2016-08-02 MED ORDER — ALBUTEROL (5 MG/ML) CONTINUOUS INHALATION SOLN
10.0000 mg/h | INHALATION_SOLUTION | RESPIRATORY_TRACT | Status: AC
  Administered 2016-08-02: 10 mg/h via RESPIRATORY_TRACT
  Filled 2016-08-02: qty 20

## 2016-08-02 MED ORDER — LEVOFLOXACIN 750 MG PO TABS
750.0000 mg | ORAL_TABLET | Freq: Once | ORAL | Status: AC
  Administered 2016-08-02: 750 mg via ORAL
  Filled 2016-08-02: qty 1

## 2016-08-02 MED ORDER — METHYLPREDNISOLONE SODIUM SUCC 125 MG IJ SOLR
125.0000 mg | Freq: Once | INTRAMUSCULAR | Status: AC
  Administered 2016-08-02: 125 mg via INTRAVENOUS
  Filled 2016-08-02: qty 2

## 2016-08-02 MED ORDER — DEXTROSE IN LACTATED RINGERS 5 % IV SOLN
INTRAVENOUS | Status: DC
  Administered 2016-08-02 – 2016-08-04 (×4): via INTRAVENOUS
  Filled 2016-08-02: qty 1000

## 2016-08-02 MED ORDER — TRAZODONE HCL 50 MG PO TABS
100.0000 mg | ORAL_TABLET | Freq: Every evening | ORAL | Status: DC | PRN
  Administered 2016-08-03 – 2016-08-05 (×4): 100 mg via ORAL
  Filled 2016-08-02 (×2): qty 2
  Filled 2016-08-02: qty 1
  Filled 2016-08-02: qty 2
  Filled 2016-08-02: qty 1

## 2016-08-02 MED ORDER — IPRATROPIUM-ALBUTEROL 0.5-2.5 (3) MG/3ML IN SOLN
3.0000 mL | Freq: Three times a day (TID) | RESPIRATORY_TRACT | Status: DC
  Administered 2016-08-03 – 2016-08-05 (×7): 3 mL via RESPIRATORY_TRACT
  Filled 2016-08-02 (×7): qty 3

## 2016-08-02 MED ORDER — ONDANSETRON HCL 4 MG PO TABS: 4.0000 mg | ORAL_TABLET | Freq: Four times a day (QID) | ORAL | Status: DC | PRN

## 2016-08-02 MED ORDER — ADULT MULTIVITAMIN W/MINERALS CH
1.0000 | ORAL_TABLET | Freq: Every day | ORAL | Status: DC
  Administered 2016-08-02 – 2016-08-06 (×5): 1 via ORAL
  Filled 2016-08-02 (×5): qty 1

## 2016-08-02 MED ORDER — LORAZEPAM 2 MG/ML IJ SOLN: 1.0000 mg | Freq: Four times a day (QID) | INTRAMUSCULAR | Status: AC | PRN

## 2016-08-02 MED ORDER — FOLIC ACID 1 MG PO TABS
1.0000 mg | ORAL_TABLET | Freq: Every day | ORAL | Status: DC
  Administered 2016-08-02 – 2016-08-06 (×5): 1 mg via ORAL
  Filled 2016-08-02 (×5): qty 1

## 2016-08-02 MED ORDER — FOLIC ACID 5 MG/ML IJ SOLN
1.0000 mg | Freq: Once | INTRAMUSCULAR | Status: AC
  Administered 2016-08-02: 1 mg via INTRAVENOUS
  Filled 2016-08-02: qty 0.2

## 2016-08-02 MED ORDER — NICOTINE 21 MG/24HR TD PT24
21.0000 mg | MEDICATED_PATCH | Freq: Every day | TRANSDERMAL | Status: DC
  Administered 2016-08-02 – 2016-08-06 (×5): 21 mg via TRANSDERMAL
  Filled 2016-08-02 (×5): qty 1

## 2016-08-02 MED ORDER — ONDANSETRON HCL 4 MG/2ML IJ SOLN: 4.0000 mg | Freq: Four times a day (QID) | INTRAMUSCULAR | Status: DC | PRN

## 2016-08-02 MED ORDER — VITAMIN B-1 100 MG PO TABS
100.0000 mg | ORAL_TABLET | Freq: Every day | ORAL | Status: DC
  Administered 2016-08-02 – 2016-08-06 (×5): 100 mg via ORAL
  Filled 2016-08-02 (×5): qty 1

## 2016-08-02 MED ORDER — THIAMINE HCL 100 MG/ML IJ SOLN
100.0000 mg | Freq: Once | INTRAMUSCULAR | Status: AC
  Administered 2016-08-02: 100 mg via INTRAVENOUS
  Filled 2016-08-02: qty 2

## 2016-08-02 MED ORDER — ACETAMINOPHEN 650 MG RE SUPP: 650.0000 mg | Freq: Four times a day (QID) | RECTAL | Status: DC | PRN

## 2016-08-02 MED ORDER — GABAPENTIN 100 MG PO CAPS
100.0000 mg | ORAL_CAPSULE | Freq: Three times a day (TID) | ORAL | Status: DC
  Administered 2016-08-02 – 2016-08-06 (×11): 100 mg via ORAL
  Filled 2016-08-02 (×11): qty 1

## 2016-08-02 MED ORDER — LORAZEPAM 2 MG/ML IJ SOLN
1.0000 mg | Freq: Once | INTRAMUSCULAR | Status: AC
  Administered 2016-08-02: 1 mg via INTRAVENOUS
  Filled 2016-08-02: qty 1

## 2016-08-02 MED ORDER — SODIUM CHLORIDE 0.9% FLUSH
3.0000 mL | Freq: Two times a day (BID) | INTRAVENOUS | Status: DC
  Administered 2016-08-04 – 2016-08-06 (×4): 3 mL via INTRAVENOUS

## 2016-08-02 MED ORDER — IPRATROPIUM-ALBUTEROL 0.5-2.5 (3) MG/3ML IN SOLN
3.0000 mL | Freq: Four times a day (QID) | RESPIRATORY_TRACT | Status: DC
  Administered 2016-08-02: 3 mL via RESPIRATORY_TRACT
  Filled 2016-08-02: qty 3

## 2016-08-02 MED ORDER — ENOXAPARIN SODIUM 40 MG/0.4ML ~~LOC~~ SOLN
40.0000 mg | SUBCUTANEOUS | Status: DC
  Administered 2016-08-02 – 2016-08-05 (×4): 40 mg via SUBCUTANEOUS
  Filled 2016-08-02 (×4): qty 0.4

## 2016-08-02 MED ORDER — METHYLPREDNISOLONE SODIUM SUCC 125 MG IJ SOLR
60.0000 mg | Freq: Three times a day (TID) | INTRAMUSCULAR | Status: DC
  Administered 2016-08-02 – 2016-08-05 (×9): 60 mg via INTRAVENOUS
  Filled 2016-08-02 (×10): qty 2

## 2016-08-02 MED ORDER — LORAZEPAM 1 MG PO TABS
1.0000 mg | ORAL_TABLET | Freq: Four times a day (QID) | ORAL | Status: AC | PRN
  Administered 2016-08-03 – 2016-08-04 (×3): 1 mg via ORAL
  Filled 2016-08-02 (×3): qty 1

## 2016-08-02 MED ORDER — DILTIAZEM LOAD VIA INFUSION
5.0000 mg | Freq: Once | INTRAVENOUS | Status: AC
  Administered 2016-08-02: 5 mg via INTRAVENOUS
  Filled 2016-08-02: qty 5

## 2016-08-02 MED ORDER — ACETAMINOPHEN 325 MG PO TABS
650.0000 mg | ORAL_TABLET | Freq: Four times a day (QID) | ORAL | Status: DC | PRN
  Administered 2016-08-03 – 2016-08-05 (×5): 650 mg via ORAL
  Filled 2016-08-02 (×6): qty 2

## 2016-08-02 MED ORDER — DILTIAZEM HCL-DEXTROSE 100-5 MG/100ML-% IV SOLN (PREMIX)
5.0000 mg/h | INTRAVENOUS | Status: DC
  Administered 2016-08-02: 5 mg/h via INTRAVENOUS
  Administered 2016-08-03: 15 mg/h via INTRAVENOUS
  Filled 2016-08-02 (×3): qty 100

## 2016-08-02 NOTE — ED Notes (Signed)
Bed: [id number] Expected date:  Expected time:  Means of arrival:  Comments: EMS- 59s M, fall/no complaints

## 2016-08-02 NOTE — H&P (Signed)
History and Physical  Samuel Reyes [id number] DOB: March 24, 1956 DOA: 08/02/2016   PCP: Maren Reamer, MD     Patient coming from: Home  Chief Complaint: Shortness of breath and chest pain  HPI:  Samuel Reyes is a 61 y.o. male with medical history of alcohol abuse, tobacco abuse, COPD, and depression was found unresponsive by a bystander. The last thing the patient remembered was going to a local [employer] to get beer and wine. On 08/01/2016, the patient drank 4 x 40 ounces of beer.  On the morning of December 31 2016, the patient states that he drank a whole bottle of wine which was the last thing he remembered. The next thing he remembered was waking up on the sidewalk with EMS at his side.  The patient has been complaining of chest pain for the past 3-4 weeks. He states that it is constant substernal and dull in nature with no alleviating or aggravating factors. He states that he has some dyspnea on exertion which has been constant without significant worsening. He has been taking Goody's 6-8 per day for the past 3-4 weeks. He denies any fevers, chills, headache, neck pain, diarrhea, dysuria, hematuria. The patient had 3 episodes of nausea and vomiting on the day of admission. He continues to smoke up to 2 packs per day.  In the emergency department, the patient was noted to be tachycardic with heart rate 130-140 with a stable blood pressure. He was also hypoxemic with oxygen saturation 88-90% on room air. The patient was given 1 hour nebulizer, IV Solu-Medrol. CT of the brain negative for acute findings. CT cervical spine negative for fracture. Chest x-ray showed mild bronchitic changes without consolidation. Admission was requested thereafter. I started the patient on diltiazem for his atrial tachycardia, and the patient was admitted to the stepdown unit.  Assessment/Plan: Acute respiratory failure with hypoxia -Secondary to COPD exacerbation -Continue IV Solu-Medrol -Continue duo  nebs -Presently stable on 2 L -Wean oxygen for saturation greater than 92% -Pulmonary hygiene  COPD exacerbation -As above  MAT -personally reviewed EKG--MAT -Started diltiazem drip -Titrate for heart rate <110 -TSH -Echocardiogram  Nausea and vomiting -Likely due to alcohol intoxication -Check lipase -UA and urine culture -Alcohol level 199  Atypical chest pain -cycle troponins -EKG--MAT with no STT changes -D-dimer  Alcohol abuse -Alcohol withdrawal protocol -Last drink was the morning of admission -Discontinue Wellbutrin as this will lower his seizure threshold  Tobacco abuse -Tobacco cessation discussed  Impaired glucose tolerance -Recheck hemoglobin A1c -11/16/2015 hemoglobin A1c 5.9        Past Medical History:  Diagnosis Date  . COPD (chronic obstructive pulmonary disease) ([place])   . Depression   . GERD (gastroesophageal reflux disease)   . Headache    Past Surgical History:  Procedure Laterality Date  . BACK SURGERY     3 cervical spine surgeries C4-C5 fused  . COLONOSCOPY N/A 01/04/2014   Procedure: COLONOSCOPY;  Surgeon: Danie Binder, MD;  Location: [facility];  Service: Endoscopy;  Laterality: N/A;  1:45  . ESOPHAGOGASTRODUODENOSCOPY N/A 01/04/2014   Procedure: ESOPHAGOGASTRODUODENOSCOPY (EGD);  Surgeon: Danie Binder, MD;  Location: [facility];  Service: Endoscopy;  Laterality: N/A;  . FINGER SURGERY Left    2nd, 3rd, & 4th fingers were cut off by table saw and reattached  . GASTRECTOMY    . HERNIA REPAIR    . INCISIONAL HERNIA REPAIR N/A 01/20/2014   Procedure: LAPAROSCOPIC RECURRENT  INCISIONAL  HERNIA with mesh;  Surgeon: Edward Jolly, MD;  Location: [facility];  Service: General;  Laterality: N/A;  . rt knee arthroscopic surgery    . SHOULDER SURGERY Bilateral    3 surgeries on on left, 2 surgeries on right    Social History:  reports that he has been smoking Cigarettes.  He started smoking about 50 years ago. He has a 45.00  pack-year smoking history. He has never used smokeless tobacco. He reports that he drinks alcohol. He reports that he does not use drugs.   Family History  Problem Relation Age of Onset  . Cancer Father     bone  . Cancer Brother     lungs  . Stroke Maternal Grandmother   . Asthma Son     died at age 31 in his sleep   . Spina bifida Son     died at age 28   . Dementia Mother   . Colon cancer Neg Hx      Allergies  Allergen Reactions  . Bee Venom Anaphylaxis  . Penicillins Rash    Has patient had a PCN reaction causing immediate rash, facial/tongue/throat swelling, SOB or lightheadedness with hypotension: {Yes Has patient had a PCN reaction causing severe rash involving mucus membranes or skin necrosis: NO Has patient had a PCN reaction that required hospitalization {Yes Has patient had a PCN reaction occurring within the last 10 years: NO If all of the above answers are "NO", then may proceed with Cephalosporin use.      Prior to Admission medications   Medication Sig Start Date End Date Taking? Authorizing Provider  Aspirin-Acetaminophen-Caffeine (GOODY HEADACHE PO) Take 3 packets by mouth daily as needed (headaches).   Yes Historical Provider, MD  albuterol (PROVENTIL HFA;VENTOLIN HFA) 108 (90 Base) MCG/ACT inhaler Inhale 1-2 puffs into the lungs every 4 (four) hours as needed for wheezing. 11/07/15   Encarnacion Slates, NP  albuterol-ipratropium (COMBIVENT) 18-103 MCG/ACT inhaler Inhale 2 puffs into the lungs every 6 (six) hours as needed for wheezing or shortness of breath. 01/10/16   Maren Reamer, MD  buPROPion (WELLBUTRIN SR) 200 MG 12 hr tablet Take 1 tablet (200 mg total) by mouth at bedtime. 12/13/15   Niel Hummer, NP  folic acid (FOLVITE) 1 MG tablet Take 1 tablet (1 mg total) by mouth daily. 12/13/15   Niel Hummer, NP  gabapentin (NEURONTIN) 100 MG capsule Take 1 capsule (100 mg total) by mouth 3 (three) times daily. For agitation/alcohol withdrawal syndrome 12/13/15    Niel Hummer, NP  metoprolol tartrate (LOPRESSOR) 25 MG tablet Take 1 tablet (25 mg total) by mouth daily before breakfast. For high blood pressure 11/07/15   Encarnacion Slates, NP  nicotine (NICODERM CQ - DOSED IN MG/24 HOURS) 21 mg/24hr patch Place 1 patch (21 mg total) onto the skin daily. For nicotine addiction 11/07/15   Encarnacion Slates, NP  thiamine 100 MG tablet Take 1 tablet (100 mg total) by mouth daily. 12/13/15   Niel Hummer, NP  traZODone (DESYREL) 100 MG tablet Take 1 tablet (100 mg total) by mouth at bedtime as needed for sleep. 12/13/15   Niel Hummer, NP    Review of Systems:  Constitutional:  No weight loss, night sweats, Fevers, chills, fatigue.  Head&Eyes: No headache.  No vision loss.  No eye pain or scotoma ENT:  No Difficulty swallowing,Tooth/dental problems,Sore throat,  No ear ache, post nasal drip,  Cardio-vascular:  NoOrthopnea, PND, swelling  in lower extremities,  dizziness, palpitations  GI:  No  abdominal pain, nausea, vomiting, diarrhea, loss of appetite, hematochezia, melena, heartburn, indigestion, Resp:   No coughing up of blood .No wheezing.No chest wall deformity  Skin:  no rash or lesions.  GU:  no dysuria, change in color of urine, no urgency or frequency. No flank pain.  Musculoskeletal:  No joint pain or swelling. No decreased range of motion. No back pain.  Psych:  No change in mood or affect. Neurologic: No headache, no dysesthesia, no focal weakness, no vision loss.   Physical Exam: Vitals:   08/02/16 1529 08/02/16 1726 08/02/16 1752 08/02/16 1830  BP: 121/67 131/70  140/71  Pulse: (!) 124 (!) 134 (!) 139 (!) 134  Resp: 20 19 20 20   Temp:    98.4 F (36.9 C)  TempSrc:    Oral  SpO2: 100% 96% 100% 100%  Weight:      Height:       General:  A&O x 3, NAD, nontoxic, pleasant/cooperative Head/Eye: No conjunctival hemorrhage, no icterus, [place]/AT, No nystagmus ENT:  No icterus,  No thrush, good dentition, no pharyngeal exudate Neck:  No  masses, no lymphadenpathy, no bruits CV:  RRR, no rub, no gallop, no S3 Lung:  CTAB, good air movement, no wheeze, no rhonchi Abdomen: soft/NT, +BS, nondistended, no peritoneal signs Ext: No cyanosis, No rashes, No petechiae, No lymphangitis, No edema Neuro: CNII-XII intact, strength 4/5 in bilateral upper and lower extremities, no dysmetria  Labs on Admission:  Basic Metabolic Panel:  Recent Labs Lab 08/02/16 1302  NA 135  K 3.7  CL 101  CO2 22  GLUCOSE 94  BUN <5*  CREATININE 0.66  CALCIUM 8.2*   Liver Function Tests:  Recent Labs Lab 08/02/16 1302  AST 124*  ALT 96*  ALKPHOS 97  BILITOT 0.1*  PROT 7.1  ALBUMIN 3.7   No results for input(s): LIPASE, AMYLASE in the last 168 hours. No results for input(s): AMMONIA in the last 168 hours. CBC:  Recent Labs Lab 08/02/16 1302  WBC 7.7  NEUTROABS 5.2  HGB 11.0*  HCT 34.1*  MCV 72.4*  PLT 451*   Coagulation Profile: No results for input(s): INR, PROTIME in the last 168 hours. Cardiac Enzymes:  Recent Labs Lab 08/02/16 1302  CKTOTAL 25*   BNP: Invalid input(s): POCBNP CBG: No results for input(s): GLUCAP in the last 168 hours. Urine analysis:    Component Value Date/Time   COLORURINE YELLOW 11/15/2015 1400   APPEARANCEUR CLEAR 11/15/2015 1400   LABSPEC 1.009 11/15/2015 1400   PHURINE 5.5 11/15/2015 1400   GLUCOSEU NEGATIVE 11/15/2015 1400   HGBUR NEGATIVE 11/15/2015 1400   BILIRUBINUR NEGATIVE 11/15/2015 1400   KETONESUR NEGATIVE 11/15/2015 1400   PROTEINUR NEGATIVE 11/15/2015 1400   UROBILINOGEN 0.2 05/31/2015 2156   NITRITE NEGATIVE 11/15/2015 1400   LEUKOCYTESUR NEGATIVE 11/15/2015 1400   Sepsis Labs: @LABRCNTIP (procalcitonin:4,lacticidven:4) )No results found for this or any previous visit (from the past 240 hour(s)).   Radiological Exams on Admission: Dg Chest 2 View  Result Date: 08/02/2016 CLINICAL DATA:  New onset of shortness of breath and wheezing. Patient experience dizziness at  home and fell. History of COPD. Current smoker. EXAM: CHEST  2 VIEW COMPARISON:  PA and lateral chest x-ray of January 20, 2016 FINDINGS: The lungs are well-expanded. There is no focal infiltrate. There is mild stable prominence of the interstitial markings bilaterally. There is no pleural effusion. The heart and pulmonary vascularity are normal. There  is calcification in the wall of the aortic arch. The bony thorax exhibits no acute abnormality. IMPRESSION: Mild chronic bronchitic changes, stable. No acute pneumonia nor CHF. Thoracic aortic atherosclerosis. Electronically Signed   By: Nathalya Wolanski  [place] M.D.   On: 08/02/2016 11:21   Ct Head Wo Contrast  Result Date: 08/02/2016 CLINICAL DATA:  Per EMS, patient reports falling outside of his residence. Patient reports drinking 2 40s this morning. Patient is unsure of how he fell. No obvious injuries noted. No C-collar in place m Patient has chronic neck/back pain. Patient conscious, alert, oriented.EMS auscultated wheezing in all fields. Patient received albuterol en route with EMS.Hx of stroke 4 years ago; weakness to right side from this stroke. EXAM: CT HEAD WITHOUT CONTRAST CT CERVICAL SPINE WITHOUT CONTRAST TECHNIQUE: Multidetector CT imaging of the head and cervical spine was performed following the standard protocol without intravenous contrast. Multiplanar CT image reconstructions of the cervical spine were also generated. COMPARISON:  12/14/2015 FINDINGS: CT HEAD FINDINGS Brain: The ventricles are normal in configuration. There is ventricular and sulcal enlargement reflecting mild to moderate atrophy, advanced for age. No hydrocephalus. There are no parenchymal masses or mass effect. There is no evidence of an infarct. There are no extra-axial masses or abnormal fluid collections. There is no intracranial hemorrhage. Vascular: No hyperdense vessel or unexpected calcification. Skull: Normal. Negative for fracture or focal lesion. Sinuses/Orbits: Dependent fluid is  noted in the maxillary and sphenoid sinuses and right frontal sinus. Moderate mucosal thickening lines the ethmoid air cells with mild mucosal thickening noted along the sphenoid sinuses, inferior right frontal sinus and maxillary sinuses. Clear mastoid air cells. Globes orbits are unremarkable. Other: None. CT CERVICAL SPINE FINDINGS Alignment: Reversal of the normal cervical lordosis centered at C4. No spondylolisthesis. Skull base and vertebrae: No acute fracture. No primary bone lesion or focal pathologic process. Soft tissues and spinal canal: No prevertebral fluid or swelling. No visible canal hematoma. Disc levels: Loss of disc height throughout the cervical spine. There is moderate loss of disc height at C3-C4 and C4-C5. Acquired fusion at the C5-C6 level, across the disc space. Moderate loss disc height at C6-C7. There are endplate osteophytes at multiple levels moderate neural foraminal narrowing is noted on the right at C3-C4. No convincing disc herniation. Upper chest: No acute or significant abnormality. Other: None IMPRESSION: HEAD CT 1. No acute intracranial abnormalities.  No skull fracture. 2. Generalized atrophy advanced for age. 3. Significant sinus disease including right frontal, bilateral maxillary and bilateral sphenoid sinus air-fluid levels. Suspect acute sinusitis. CERVICAL CT 1. No fracture or acute finding. Electronically Signed   By: Lajean Manes M.D.   On: 08/02/2016 12:14   Ct Cervical Spine Wo Contrast  Result Date: 08/02/2016 CLINICAL DATA:  Per EMS, patient reports falling outside of his residence. Patient reports drinking 2 40s this morning. Patient is unsure of how he fell. No obvious injuries noted. No C-collar in place m Patient has chronic neck/back pain. Patient conscious, alert, oriented.EMS auscultated wheezing in all fields. Patient received albuterol en route with EMS.Hx of stroke 4 years ago; weakness to right side from this stroke. EXAM: CT HEAD WITHOUT CONTRAST CT  CERVICAL SPINE WITHOUT CONTRAST TECHNIQUE: Multidetector CT imaging of the head and cervical spine was performed following the standard protocol without intravenous contrast. Multiplanar CT image reconstructions of the cervical spine were also generated. COMPARISON:  12/14/2015 FINDINGS: CT HEAD FINDINGS Brain: The ventricles are normal in configuration. There is ventricular and sulcal enlargement reflecting mild to  moderate atrophy, advanced for age. No hydrocephalus. There are no parenchymal masses or mass effect. There is no evidence of an infarct. There are no extra-axial masses or abnormal fluid collections. There is no intracranial hemorrhage. Vascular: No hyperdense vessel or unexpected calcification. Skull: Normal. Negative for fracture or focal lesion. Sinuses/Orbits: Dependent fluid is noted in the maxillary and sphenoid sinuses and right frontal sinus. Moderate mucosal thickening lines the ethmoid air cells with mild mucosal thickening noted along the sphenoid sinuses, inferior right frontal sinus and maxillary sinuses. Clear mastoid air cells. Globes orbits are unremarkable. Other: None. CT CERVICAL SPINE FINDINGS Alignment: Reversal of the normal cervical lordosis centered at C4. No spondylolisthesis. Skull base and vertebrae: No acute fracture. No primary bone lesion or focal pathologic process. Soft tissues and spinal canal: No prevertebral fluid or swelling. No visible canal hematoma. Disc levels: Loss of disc height throughout the cervical spine. There is moderate loss of disc height at C3-C4 and C4-C5. Acquired fusion at the C5-C6 level, across the disc space. Moderate loss disc height at C6-C7. There are endplate osteophytes at multiple levels moderate neural foraminal narrowing is noted on the right at C3-C4. No convincing disc herniation. Upper chest: No acute or significant abnormality. Other: None IMPRESSION: HEAD CT 1. No acute intracranial abnormalities.  No skull fracture. 2. Generalized  atrophy advanced for age. 3. Significant sinus disease including right frontal, bilateral maxillary and bilateral sphenoid sinus air-fluid levels. Suspect acute sinusitis. CERVICAL CT 1. No fracture or acute finding. Electronically Signed   By: Lajean Manes M.D.   On: 08/02/2016 12:14    EKG: Independently reviewed. Atrial tachycardia, no ST-T wave changes    Time spent:60 minutes Code Status:   FULL Family Communication:  No Family at bedside Disposition Plan: expect 2-3 day hospitalization Consults called: none DVT Prophylaxis: [place] Lovenox  Hill Mackie, DO  [facility] Pager [phone]  If 7PM-7AM, please contact night-coverage [email or web address] Password [facility] 08/02/2016, 6:55 PM

## 2016-08-02 NOTE — ED Notes (Signed)
MD at bedside. 

## 2016-08-02 NOTE — ED Provider Notes (Signed)
[facility] DEPT Provider Note   CSN: [id number] Arrival date & time: 08/02/16  1024     History   Chief Complaint Chief Complaint  Patient presents with  . Fall  . Wheezing     HPI   Blood pressure 135/78, pulse 115, temperature 97.9 F (36.6 C), temperature source Oral, resp. rate 16, height 5\' 4"  (1.626 m), weight 65.8 kg, SpO2 100 %.  Samuel Reyes is a 61 y.o. male brought in by EMS after being found down in the cold this morning. Patient states he remember drinking alcohol with his friends and that the last thing he remembers. It is unclear how long he was out for. States that he is interested in stopping drinking, he has had a history of tremors from his alcohol withdrawals but never seizures or hallucinations. He states he was inpatient for withdrawal in the past. He endorses shortness of breath and wheezing, states that he's been eating and drinking less than normally because he has been focused on drinking, he is depressed because it is the anniversary of his son's death, he denies any suicidal ideation, homicidal ideation, auditory or visual hallucinations. He endorses cervical pain with headache no numbness, weakness, focal chest pain. He states that he has been vomiting recently he denies hematemesis, coffee-ground emesis, melena, hematochezia.   Past Medical History:  Diagnosis Date  . COPD (chronic obstructive pulmonary disease) ([place])   . Depression   . GERD (gastroesophageal reflux disease)   . Headache     Patient Active Problem List   Diagnosis Date Noted  . Alcohol-induced mood disorder ([place]) 12/12/2015  . Syncope 11/15/2015  . Chest pain 11/15/2015  . Nausea vomiting and diarrhea 11/15/2015  . Abdominal pain 11/15/2015  . Major depressive disorder, recurrent episode, moderate with anxious distress ([place]) 10/30/2015  . Alcohol use disorder, severe, dependence ([place]) 10/29/2015  . COPD exacerbation ([place]) 09/20/2015  . Acute respiratory failure with  hypoxia ([place]) 09/18/2015  . Alcoholism /alcohol abuse ([place])   . Suicidal ideation 09/17/2015  . Alcohol intoxication ([place]) 09/17/2015  . Depression 09/17/2015  . Benign essential HTN 09/17/2015  . Hypokalemia 09/17/2015  . Hyponatremia 09/17/2015  . Coffee ground emesis 09/17/2015  . COPD (chronic obstructive pulmonary disease) ([place]) 02/17/2013  . GERD (gastroesophageal reflux disease) 02/17/2013    Past Surgical History:  Procedure Laterality Date  . BACK SURGERY     3 cervical spine surgeries C4-C5 fused  . COLONOSCOPY N/A 01/04/2014   Procedure: COLONOSCOPY;  Surgeon: Danie Binder, MD;  Location: [facility];  Service: Endoscopy;  Laterality: N/A;  1:45  . ESOPHAGOGASTRODUODENOSCOPY N/A 01/04/2014   Procedure: ESOPHAGOGASTRODUODENOSCOPY (EGD);  Surgeon: Danie Binder, MD;  Location: [facility];  Service: Endoscopy;  Laterality: N/A;  . FINGER SURGERY Left    2nd, 3rd, & 4th fingers were cut off by table saw and reattached  . GASTRECTOMY    . HERNIA REPAIR    . INCISIONAL HERNIA REPAIR N/A 01/20/2014   Procedure: LAPAROSCOPIC RECURRENT  INCISIONAL HERNIA with mesh;  Surgeon: Edward Jolly, MD;  Location: [facility] ORS;  Service: General;  Laterality: N/A;  . rt knee arthroscopic surgery    . SHOULDER SURGERY Bilateral    3 surgeries on on left, 2 surgeries on right        Home Medications    Prior to Admission medications   Medication Sig Start Date End Date Taking? Authorizing Provider  Aspirin-Acetaminophen-Caffeine (GOODY HEADACHE PO) Take 3 packets by mouth daily as  needed (headaches).   Yes Historical Provider, MD  albuterol (PROVENTIL HFA;VENTOLIN HFA) 108 (90 Base) MCG/ACT inhaler Inhale 1-2 puffs into the lungs every 4 (four) hours as needed for wheezing. 11/07/15   Encarnacion Slates, NP  albuterol-ipratropium (COMBIVENT) 18-103 MCG/ACT inhaler Inhale 2 puffs into the lungs every 6 (six) hours as needed for wheezing or shortness of breath. 01/10/16   Maren Reamer,  MD  buPROPion (WELLBUTRIN SR) 200 MG 12 hr tablet Take 1 tablet (200 mg total) by mouth at bedtime. 12/13/15   Niel Hummer, NP  folic acid (FOLVITE) 1 MG tablet Take 1 tablet (1 mg total) by mouth daily. 12/13/15   Niel Hummer, NP  gabapentin (NEURONTIN) 100 MG capsule Take 1 capsule (100 mg total) by mouth 3 (three) times daily. For agitation/alcohol withdrawal syndrome 12/13/15   Niel Hummer, NP  metoprolol tartrate (LOPRESSOR) 25 MG tablet Take 1 tablet (25 mg total) by mouth daily before breakfast. For high blood pressure 11/07/15   Encarnacion Slates, NP  nicotine (NICODERM CQ - DOSED IN MG/24 HOURS) 21 mg/24hr patch Place 1 patch (21 mg total) onto the skin daily. For nicotine addiction 11/07/15   Encarnacion Slates, NP  thiamine 100 MG tablet Take 1 tablet (100 mg total) by mouth daily. 12/13/15   Niel Hummer, NP  traZODone (DESYREL) 100 MG tablet Take 1 tablet (100 mg total) by mouth at bedtime as needed for sleep. 12/13/15   Niel Hummer, NP    Family History Family History  Problem Relation Age of Onset  . Cancer Father     bone  . Cancer Brother     lungs  . Stroke Maternal Grandmother   . Asthma Son     died at age 27 in his sleep   . Spina bifida Son     died at age 41   . Dementia Mother   . Colon cancer Neg Hx     Social History Social History  Substance Use Topics  . Smoking status: Current Every Day Smoker    Packs/day: 1.00    Years: 45.00    Types: Cigarettes    Start date: 07/30/1966  . Smokeless tobacco: Never Used  . Alcohol use Yes     Comment: 'tries to drink every day' ETOH abuse     Allergies   Bee venom and Penicillins   Review of Systems Review of Systems  10 systems reviewed and found to be negative, except as noted in the HPI.  Physical Exam Updated Vital Signs BP 121/67 (BP Location: Right Arm)   Pulse (!) 124   Temp 97.9 F (36.6 C) (Oral)   Resp 20   Ht 5\' 4"  (1.626 m)   Wt 65.8 kg   SpO2 100%   BMI 24.89 kg/m   Physical Exam    Constitutional: He is oriented to person, place, and time. He appears well-developed and well-nourished. No distress.  HENT:  Head: Normocephalic and atraumatic.  Mouth/Throat: Oropharynx is clear and moist.  Eyes: Conjunctivae and EOM are normal. Pupils are equal, round, and reactive to light.  Neck: Normal range of motion.  Rigid c-collar in place.   Grip strength, biceps, triceps 5/5 bilaterally;  can differentiate between pinprick and light touch bilaterally.   No anteriolateral hematomas/bruits   Cardiovascular: Regular rhythm and intact distal pulses.   Tachycardia  Pulmonary/Chest: Effort normal. He has wheezes.  Poor air movement, wheezing in all fields.  Abdominal: Soft. There  is no tenderness.  Musculoskeletal: Normal range of motion.  Neurological: He is alert and oriented to person, place, and time.  Skin: He is not diaphoretic.  Psychiatric: He has a normal mood and affect.  Nursing note and vitals reviewed.    ED Treatments / Results  Labs (all labs ordered are listed, but only abnormal results are displayed) Labs Reviewed  COMPREHENSIVE METABOLIC PANEL - Abnormal; Notable for the following:       Result Value   BUN <5 (*)    Calcium 8.2 (*)    AST 124 (*)    ALT 96 (*)    Total Bilirubin 0.1 (*)    All other components within normal limits  CBC WITH DIFFERENTIAL/PLATELET - Abnormal; Notable for the following:    Hemoglobin 11.0 (*)    HCT 34.1 (*)    MCV 72.4 (*)    MCH 23.4 (*)    RDW 20.6 (*)    Platelets 451 (*)    All other components within normal limits  ETHANOL - Abnormal; Notable for the following:    Alcohol, Ethyl (B) 199 (*)    All other components within normal limits  CK - Abnormal; Notable for the following:    Total CK 25 (*)    All other components within normal limits    EKG  EKG Interpretation  Date/Time:  Thursday August 02 2016 12:50:05 EST Ventricular Rate:  106 PR Interval:    QRS Duration: 98 QT Interval:  347 QTC  Calculation: 461 R Axis:   69 Text Interpretation:  Sinus tachycardia Abnormal R-wave progression, early transition no ischemic change, no sig change from old Confirmed by Johnney Killian, MD, Jeannie Done [phone]) on 08/02/2016 2:16:46 PM       Radiology Dg Chest 2 View  Result Date: 08/02/2016 CLINICAL DATA:  New onset of shortness of breath and wheezing. Patient experience dizziness at home and fell. History of COPD. Current smoker. EXAM: CHEST  2 VIEW COMPARISON:  PA and lateral chest x-ray of January 20, 2016 FINDINGS: The lungs are well-expanded. There is no focal infiltrate. There is mild stable prominence of the interstitial markings bilaterally. There is no pleural effusion. The heart and pulmonary vascularity are normal. There is calcification in the wall of the aortic arch. The bony thorax exhibits no acute abnormality. IMPRESSION: Mild chronic bronchitic changes, stable. No acute pneumonia nor CHF. Thoracic aortic atherosclerosis. Electronically Signed   By: David  Martinique M.D.   On: 08/02/2016 11:21   Ct Head Wo Contrast  Result Date: 08/02/2016 CLINICAL DATA:  Per EMS, patient reports falling outside of his residence. Patient reports drinking 2 40s this morning. Patient is unsure of how he fell. No obvious injuries noted. No C-collar in place m Patient has chronic neck/back pain. Patient conscious, alert, oriented.EMS auscultated wheezing in all fields. Patient received albuterol en route with EMS.Hx of stroke 4 years ago; weakness to right side from this stroke. EXAM: CT HEAD WITHOUT CONTRAST CT CERVICAL SPINE WITHOUT CONTRAST TECHNIQUE: Multidetector CT imaging of the head and cervical spine was performed following the standard protocol without intravenous contrast. Multiplanar CT image reconstructions of the cervical spine were also generated. COMPARISON:  12/14/2015 FINDINGS: CT HEAD FINDINGS Brain: The ventricles are normal in configuration. There is ventricular and sulcal enlargement reflecting mild to  moderate atrophy, advanced for age. No hydrocephalus. There are no parenchymal masses or mass effect. There is no evidence of an infarct. There are no extra-axial masses or abnormal fluid collections. There is  no intracranial hemorrhage. Vascular: No hyperdense vessel or unexpected calcification. Skull: Normal. Negative for fracture or focal lesion. Sinuses/Orbits: Dependent fluid is noted in the maxillary and sphenoid sinuses and right frontal sinus. Moderate mucosal thickening lines the ethmoid air cells with mild mucosal thickening noted along the sphenoid sinuses, inferior right frontal sinus and maxillary sinuses. Clear mastoid air cells. Globes orbits are unremarkable. Other: None. CT CERVICAL SPINE FINDINGS Alignment: Reversal of the normal cervical lordosis centered at C4. No spondylolisthesis. Skull base and vertebrae: No acute fracture. No primary bone lesion or focal pathologic process. Soft tissues and spinal canal: No prevertebral fluid or swelling. No visible canal hematoma. Disc levels: Loss of disc height throughout the cervical spine. There is moderate loss of disc height at C3-C4 and C4-C5. Acquired fusion at the C5-C6 level, across the disc space. Moderate loss disc height at C6-C7. There are endplate osteophytes at multiple levels moderate neural foraminal narrowing is noted on the right at C3-C4. No convincing disc herniation. Upper chest: No acute or significant abnormality. Other: None IMPRESSION: HEAD CT 1. No acute intracranial abnormalities.  No skull fracture. 2. Generalized atrophy advanced for age. 3. Significant sinus disease including right frontal, bilateral maxillary and bilateral sphenoid sinus air-fluid levels. Suspect acute sinusitis. CERVICAL CT 1. No fracture or acute finding. Electronically Signed   By: Lajean Manes M.D.   On: 08/02/2016 12:14   Ct Cervical Spine Wo Contrast  Result Date: 08/02/2016 CLINICAL DATA:  Per EMS, patient reports falling outside of his residence.  Patient reports drinking 2 40s this morning. Patient is unsure of how he fell. No obvious injuries noted. No C-collar in place m Patient has chronic neck/back pain. Patient conscious, alert, oriented.EMS auscultated wheezing in all fields. Patient received albuterol en route with EMS.Hx of stroke 4 years ago; weakness to right side from this stroke. EXAM: CT HEAD WITHOUT CONTRAST CT CERVICAL SPINE WITHOUT CONTRAST TECHNIQUE: Multidetector CT imaging of the head and cervical spine was performed following the standard protocol without intravenous contrast. Multiplanar CT image reconstructions of the cervical spine were also generated. COMPARISON:  12/14/2015 FINDINGS: CT HEAD FINDINGS Brain: The ventricles are normal in configuration. There is ventricular and sulcal enlargement reflecting mild to moderate atrophy, advanced for age. No hydrocephalus. There are no parenchymal masses or mass effect. There is no evidence of an infarct. There are no extra-axial masses or abnormal fluid collections. There is no intracranial hemorrhage. Vascular: No hyperdense vessel or unexpected calcification. Skull: Normal. Negative for fracture or focal lesion. Sinuses/Orbits: Dependent fluid is noted in the maxillary and sphenoid sinuses and right frontal sinus. Moderate mucosal thickening lines the ethmoid air cells with mild mucosal thickening noted along the sphenoid sinuses, inferior right frontal sinus and maxillary sinuses. Clear mastoid air cells. Globes orbits are unremarkable. Other: None. CT CERVICAL SPINE FINDINGS Alignment: Reversal of the normal cervical lordosis centered at C4. No spondylolisthesis. Skull base and vertebrae: No acute fracture. No primary bone lesion or focal pathologic process. Soft tissues and spinal canal: No prevertebral fluid or swelling. No visible canal hematoma. Disc levels: Loss of disc height throughout the cervical spine. There is moderate loss of disc height at C3-C4 and C4-C5. Acquired fusion  at the C5-C6 level, across the disc space. Moderate loss disc height at C6-C7. There are endplate osteophytes at multiple levels moderate neural foraminal narrowing is noted on the right at C3-C4. No convincing disc herniation. Upper chest: No acute or significant abnormality. Other: None IMPRESSION: HEAD CT 1. No  acute intracranial abnormalities.  No skull fracture. 2. Generalized atrophy advanced for age. 3. Significant sinus disease including right frontal, bilateral maxillary and bilateral sphenoid sinus air-fluid levels. Suspect acute sinusitis. CERVICAL CT 1. No fracture or acute finding. Electronically Signed   By: Lajean Manes M.D.   On: 08/02/2016 12:14    Procedures Procedures (including critical care time)  Medications Ordered in ED Medications  dextrose 5 % in lactated ringers infusion ( Intravenous New Bag/Given 08/02/16 1320)  albuterol (PROVENTIL,VENTOLIN) solution continuous neb (0 mg/hr Nebulization Stopped 08/02/16 1524)  thiamine (B-1) injection 100 mg (100 mg Intravenous Given [id number])  folic acid 1 mg in sodium chloride 0.9 % 50 mL IVPB (1 mg Intravenous Given 08/02/16 1301)  LORazepam (ATIVAN) injection 1 mg (1 mg Intravenous Given 08/02/16 1315)  methylPREDNISolone sodium succinate (SOLU-MEDROL) 125 mg/2 mL injection 125 mg (125 mg Intravenous Given 08/02/16 1315)  levofloxacin (LEVAQUIN) tablet 750 mg (750 mg Oral Given 08/02/16 1313)  sodium chloride 0.9 % bolus 1,000 mL (1,000 mLs Intravenous New Bag/Given 08/02/16 1459)     Initial Impression / Assessment and Plan / ED Course  I have reviewed the triage vital signs and the nursing notes.  Pertinent labs & imaging results that were available during my care of the patient were reviewed by me and considered in my medical decision making (see chart for details).  Clinical Course     Vitals:   08/02/16 1100 08/02/16 1324 08/02/16 1329 08/02/16 1529  BP: 127/80  135/78 121/67  Pulse: 119  115 (!) 124  Resp: 19  16 20     Temp:      TempSrc:      SpO2: 93% 96% 100% 100%  Weight:      Height:        Medications  dextrose 5 % in lactated ringers infusion ( Intravenous New Bag/Given 08/02/16 1320)  albuterol (PROVENTIL,VENTOLIN) solution continuous neb (0 mg/hr Nebulization Stopped 08/02/16 1524)  thiamine (B-1) injection 100 mg (100 mg Intravenous Given [id number])  folic acid 1 mg in sodium chloride 0.9 % 50 mL IVPB (1 mg Intravenous Given 08/02/16 1301)  LORazepam (ATIVAN) injection 1 mg (1 mg Intravenous Given 08/02/16 1315)  methylPREDNISolone sodium succinate (SOLU-MEDROL) 125 mg/2 mL injection 125 mg (125 mg Intravenous Given 08/02/16 1315)  levofloxacin (LEVAQUIN) tablet 750 mg (750 mg Oral Given 08/02/16 1313)  sodium chloride 0.9 % bolus 1,000 mL (1,000 mLs Intravenous New Bag/Given 08/02/16 1459)    Samuel Reyes is 61 y.o. male with past medical history significant for alcohol abuse and hypertension found down by EMS. Patient is wheezing significantly, he was drinking heavily that the last thing he remembers. EKG with no acute findings, sinus tach. Lung sounds with very poor air movement and significant wheezing. Will start him on Solu-Medrol, given hour-long nebulizer and Levaquin for likely COPD exacerbation. Chest x-rays without infiltrate, no signs of CHF. He reports a burning sensation in the chest, doubt ACS. This is more likely COPD.  Reevaluated after hour-long nebulizer, still wheezing significantly and short of breath, think he will need admission for COPD exacerbation.  Stress with triad hospitalist Dr. Carles Collet who accepts admission.  Final Clinical Impressions(s) / ED Diagnoses   Final diagnoses:  COPD exacerbation [facility])    New Prescriptions New Prescriptions   No medications on file     Monico Blitz, PA-C 08/02/16 [place], MD 08/02/16 1705

## 2016-08-02 NOTE — Progress Notes (Signed)
ED CM noted Cm consult for Pt states he has trouble paying for meds at times. Please provide available resources. ED Registration verifies this is a [place] medicaid covered pt  Cm spoke with pt about CM consult. CM discussed with pt that there is not a [facility] program available to assist with medications for [place] medicaid patients Discussed [place] medicaid patient co pays $3 and less for medicaid approved medications Pt voiced understanding Cm encouraged access of community resources like DSS, churches and speaking with his local pharmacist for assist with managing discounted medicaid co pays or pharmacy crediting Pt voiced understanding

## 2016-08-02 NOTE — ED Triage Notes (Addendum)
Per EMS, patient reports falling outside of his residence. Patient reports drinking 2 40s this morning. Patient is unsure of how he fell. No obvious injuries noted. C-collar in place for precaution. Patient has chronic neck/back pain. Patient conscious, alert, oriented. EMS auscultated wheezing in all fields. Patient received albuterol en route with EMS. Hx of stroke 4 years ago; weakness to right side from this stroke. Patient is from home.

## 2016-08-02 NOTE — ED Notes (Signed)
Lab draws and IV delayed d/t patient being in x-ray, and now providers are at bedside.

## 2016-08-03 ENCOUNTER — Inpatient Hospital Stay (HOSPITAL_COMMUNITY): Payer: Medicaid Other

## 2016-08-03 DIAGNOSIS — R9431 Abnormal electrocardiogram [ECG] [EKG]: Secondary | ICD-10-CM

## 2016-08-03 LAB — CBC
HCT: 30.9 % — ABNORMAL LOW (ref 39.0–52.0)
Hemoglobin: 9.9 g/dL — ABNORMAL LOW (ref 13.0–17.0)
MCH: 23.1 pg — AB (ref 26.0–34.0)
MCHC: 32 g/dL (ref 30.0–36.0)
MCV: 72.2 fL — AB (ref 78.0–100.0)
PLATELETS: 404 10*3/uL — AB (ref 150–400)
RBC: 4.28 MIL/uL (ref 4.22–5.81)
RDW: 20.5 % — AB (ref 11.5–15.5)
WBC: 18.9 10*3/uL — ABNORMAL HIGH (ref 4.0–10.5)

## 2016-08-03 LAB — BASIC METABOLIC PANEL
Anion gap: 10 (ref 5–15)
BUN: 6 mg/dL (ref 6–20)
CHLORIDE: 99 mmol/L — AB (ref 101–111)
CO2: 26 mmol/L (ref 22–32)
CREATININE: 0.57 mg/dL — AB (ref 0.61–1.24)
Calcium: 7.9 mg/dL — ABNORMAL LOW (ref 8.9–10.3)
GFR calc Af Amer: >60 mL/min (ref >60)
GFR calc non Af Amer: >60 mL/min (ref >60)
Glucose, Bld: 199 mg/dL — ABNORMAL HIGH (ref 65–99)
Potassium: 3.2 mmol/L — ABNORMAL LOW (ref 3.5–5.1)
Sodium: 135 mmol/L (ref 135–145)

## 2016-08-03 LAB — TROPONIN I
Troponin I: <0.03 ng/mL (ref <0.03)
Troponin I: <0.03 ng/mL (ref <0.03)

## 2016-08-03 LAB — ECHOCARDIOGRAM COMPLETE
Height: 64 in
Weight: 2320 oz

## 2016-08-03 LAB — URINALYSIS, ROUTINE W REFLEX MICROSCOPIC
Bilirubin Urine: NEGATIVE
Hgb urine dipstick: NEGATIVE
Ketones, ur: NEGATIVE mg/dL
Leukocytes, UA: NEGATIVE
NITRITE: NEGATIVE
PH: 5 (ref 5.0–8.0)
PROTEIN: NEGATIVE mg/dL
Specific Gravity, Urine: 1.021 (ref 1.005–1.030)

## 2016-08-03 LAB — RAPID URINE DRUG SCREEN, HOSP PERFORMED
AMPHETAMINES: NOT DETECTED
BENZODIAZEPINES: NOT DETECTED
Barbiturates: NOT DETECTED
Cocaine: NOT DETECTED
Opiates: POSITIVE — AB
Tetrahydrocannabinol: NOT DETECTED

## 2016-08-03 MED ORDER — POTASSIUM CHLORIDE CRYS ER 20 MEQ PO TBCR
40.0000 meq | EXTENDED_RELEASE_TABLET | Freq: Two times a day (BID) | ORAL | Status: AC
  Administered 2016-08-03 (×2): 40 meq via ORAL
  Filled 2016-08-03 (×2): qty 2

## 2016-08-03 MED ORDER — METOPROLOL TARTRATE 25 MG PO TABS
25.0000 mg | ORAL_TABLET | Freq: Every day | ORAL | Status: DC
  Administered 2016-08-03 – 2016-08-04 (×2): 25 mg via ORAL
  Filled 2016-08-03 (×2): qty 1

## 2016-08-03 MED ORDER — ALUM & MAG HYDROXIDE-SIMETH 200-200-20 MG/5ML PO SUSP
30.0000 mL | ORAL | Status: DC | PRN
  Administered 2016-08-03 – 2016-08-05 (×3): 30 mL via ORAL
  Filled 2016-08-03 (×3): qty 30

## 2016-08-03 NOTE — Progress Notes (Signed)
PROGRESS NOTE    Samuel Reyes  [id number] DOB: [birth year] DOA: 08/02/2016 PCP: Maren Reamer, MD    Brief Narrative:  61 y.o. male with medical history of alcohol abuse, tobacco abuse, COPD, and depression was found unresponsive by a bystander. The last thing the patient remembered was going to a local [employer] to get beer and wine. On 08/01/2016, the patient drank 4 x 40 ounces of beer.  On the morning of December 31 2016, the patient states that he drank a whole bottle of wine which was the last thing he remembered. The next thing he remembered was waking up on the sidewalk with EMS at his side.  The patient has been complaining of chest pain for the past 3-4 weeks. He states that it is constant substernal and dull in nature with no alleviating or aggravating factors. He states that he has some dyspnea on exertion which has been constant without significant worsening. He has been taking Goody's 6-8 per day for the past 3-4 weeks. He denies any fevers, chills, headache, neck pain, diarrhea, dysuria, hematuria. The patient had 3 episodes of nausea and vomiting on the day of admission. He continues to smoke up to 2 packs per day.  In the emergency department, the patient was noted to be tachycardic with heart rate 130-140 with a stable blood pressure. He was also hypoxemic with oxygen saturation 88-90% on room air. The patient was given 1 hour nebulizer, IV Solu-Medrol. CT of the brain negative for acute findings. CT cervical spine negative for fracture. Chest x-ray showed mild bronchitic changes without consolidation. Admission was requested thereafter. I started the patient on diltiazem for his atrial tachycardia, and the patient was admitted to the stepdown unit  Assessment & Plan:   Active Problems:   Acute respiratory failure with hypoxia (HCC)   Alcoholism /alcohol abuse (HCC)   COPD exacerbation (HCC)   Alcohol-induced mood disorder (HCC)   Atrial tachycardia (HCC)   Impaired glucose  tolerance  Acute respiratory failure with hypoxia -Secondary to COPD exacerbation -Continue IV Solu-Medrol -Patient continued on duo nebs -Currently stable on 2 LNC -Wean oxygen for saturation greater than 92%  COPD exacerbation -end-expiratory wheezing on exam -cont IV steroids and scheduled breathing tx per above  Sinus Tach -HR presently controlled -TSH within normal limits -Echocardiogram  Nausea and vomiting -Likely due to alcohol intoxication -Check lipase -UA and urine culture -Alcohol level 199  Atypical chest pain -serial trop neg -D-dimer borderline elevated at 0.62 -chest pain likely secondary to copd exacerbation  Alcohol abuse -Continue CIWA -Last drink was the morning of admission -Cont to hold Welbutrin secondary to decreased seizure thresh hold   Tobacco abuse -Tobacco cessation discussed -Stable  Impaired glucose tolerance -Hgb a1c pending -11/16/2015 hemoglobin A1c 5.9 -Stable at present  DVT prophylaxis: Lovenox subq Code Status: Full Family Communication: Pt in room, family not at bedside Disposition Plan: uncertain at this time  Consultants:     Procedures:     Antimicrobials: Anti-infectives    Start     Dose/Rate Route Frequency Ordered Stop   08/02/16 1300  levofloxacin (LEVAQUIN) tablet 750 mg     750 mg Oral  Once 08/02/16 1245 08/02/16 1313      Subjective: No complaints at present  Objective: Vitals:   08/03/16 0503 08/03/16 0808 08/03/16 1232 08/03/16 1443  BP: 132/73  119/70   Pulse: 87 90 (!) 103   Resp: 18 18    Temp: 98.2 F (36.8 C)  TempSrc: Oral     SpO2: 98% 97%  95%  Weight:      Height:        Intake/Output Summary (Last 24 hours) at 08/03/16 1755 Last data filed at 08/03/16 0923  Gross per 24 hour  Intake             1325 ml  Output             1375 ml  Net              -50 ml   Filed Weights   08/02/16 1035  Weight: 65.8 kg (145 lb)    Examination:  General exam: Appears  calm and comfortable  Respiratory system: End-expiratory wheezing on auscultation. Respiratory effort normal. Cardiovascular system: S1 & S2 heard, RRR Gastrointestinal system: Abdomen is nondistended, soft and nontender. No organomegaly or masses felt. Normal bowel sounds heard. Central nervous system: Alert and oriented. No focal neurological deficits. Extremities: Symmetric 5 x 5 power. Skin: No rashes, lesions Psychiatry: Judgement and insight appear normal. Mood & affect appropriate.   Data Reviewed: I have personally reviewed following labs and imaging studies  CBC:  Recent Labs Lab 08/02/16 1302 08/03/16 0658  WBC 7.7 18.9*  NEUTROABS 5.2  --   HGB 11.0* 9.9*  HCT 34.1* 30.9*  MCV 72.4* 72.2*  PLT 451* [id number]*   Basic Metabolic Panel:  Recent Labs Lab 08/02/16 1302 08/03/16 0658  NA 135 135  K 3.7 3.2*  CL 101 99*  CO2 22 26  GLUCOSE 94 199*  BUN <5* 6  CREATININE 0.66 0.57*  CALCIUM 8.2* 7.9*   GFR: Estimated Creatinine Clearance: 81.2 mL/min (by C-G formula based on SCr of 0.57 mg/dL (L)). Liver Function Tests:  Recent Labs Lab 08/02/16 1302  AST 124*  ALT 96*  ALKPHOS 97  BILITOT 0.1*  PROT 7.1  ALBUMIN 3.7    Recent Labs Lab 08/02/16 1926  LIPASE 28   No results for input(s): AMMONIA in the last 168 hours. Coagulation Profile: No results for input(s): INR, PROTIME in the last 168 hours. Cardiac Enzymes:  Recent Labs Lab 08/02/16 1302 08/02/16 1836 08/03/16 0036 08/03/16 0658  CKTOTAL 25*  --   --   --   TROPONINI  --  <0.03 <0.03 <0.03   BNP (last 3 results) No results for input(s): PROBNP in the last 8760 hours. HbA1C: No results for input(s): HGBA1C in the last 72 hours. CBG: No results for input(s): GLUCAP in the last 168 hours. Lipid Profile: No results for input(s): CHOL, HDL, LDLCALC, TRIG, CHOLHDL, LDLDIRECT in the last 72 hours. Thyroid Function Tests:  Recent Labs  08/02/16 1836  TSH 0.365   Anemia Panel: No  results for input(s): VITAMINB12, FOLATE, FERRITIN, TIBC, IRON, RETICCTPCT in the last 72 hours. Sepsis Labs: No results for input(s): PROCALCITON, LATICACIDVEN in the last 168 hours.  Recent Results (from the past 240 hour(s))  MRSA PCR Screening     Status: None   Collection Time: 08/02/16  8:41 PM  Result Value Ref Range Status   MRSA by PCR NEGATIVE NEGATIVE Final    Comment:        The GeneXpert MRSA Assay (FDA approved for NASAL specimens only), is one component of a comprehensive MRSA colonization surveillance program. It is not intended to diagnose MRSA infection nor to guide or monitor treatment for MRSA infections.      Radiology Studies: Dg Chest 2 View  Result Date: 08/02/2016 CLINICAL DATA:  New  onset of shortness of breath and wheezing. Patient experience dizziness at home and fell. History of COPD. Current smoker. EXAM: CHEST  2 VIEW COMPARISON:  PA and lateral chest x-ray of January 20, 2016 FINDINGS: The lungs are well-expanded. There is no focal infiltrate. There is mild stable prominence of the interstitial markings bilaterally. There is no pleural effusion. The heart and pulmonary vascularity are normal. There is calcification in the wall of the aortic arch. The bony thorax exhibits no acute abnormality. IMPRESSION: Mild chronic bronchitic changes, stable. No acute pneumonia nor CHF. Thoracic aortic atherosclerosis. Electronically Signed   By: David  Martinique M.D.   On: 08/02/2016 11:21   Ct Head Wo Contrast  Result Date: 08/02/2016 CLINICAL DATA:  Per EMS, patient reports falling outside of his residence. Patient reports drinking 2 40s this morning. Patient is unsure of how he fell. No obvious injuries noted. No C-collar in place m Patient has chronic neck/back pain. Patient conscious, alert, oriented.EMS auscultated wheezing in all fields. Patient received albuterol en route with EMS.Hx of stroke 4 years ago; weakness to right side from this stroke. EXAM: CT HEAD WITHOUT  CONTRAST CT CERVICAL SPINE WITHOUT CONTRAST TECHNIQUE: Multidetector CT imaging of the head and cervical spine was performed following the standard protocol without intravenous contrast. Multiplanar CT image reconstructions of the cervical spine were also generated. COMPARISON:  12/14/2015 FINDINGS: CT HEAD FINDINGS Brain: The ventricles are normal in configuration. There is ventricular and sulcal enlargement reflecting mild to moderate atrophy, advanced for age. No hydrocephalus. There are no parenchymal masses or mass effect. There is no evidence of an infarct. There are no extra-axial masses or abnormal fluid collections. There is no intracranial hemorrhage. Vascular: No hyperdense vessel or unexpected calcification. Skull: Normal. Negative for fracture or focal lesion. Sinuses/Orbits: Dependent fluid is noted in the maxillary and sphenoid sinuses and right frontal sinus. Moderate mucosal thickening lines the ethmoid air cells with mild mucosal thickening noted along the sphenoid sinuses, inferior right frontal sinus and maxillary sinuses. Clear mastoid air cells. Globes orbits are unremarkable. Other: None. CT CERVICAL SPINE FINDINGS Alignment: Reversal of the normal cervical lordosis centered at C4. No spondylolisthesis. Skull base and vertebrae: No acute fracture. No primary bone lesion or focal pathologic process. Soft tissues and spinal canal: No prevertebral fluid or swelling. No visible canal hematoma. Disc levels: Loss of disc height throughout the cervical spine. There is moderate loss of disc height at C3-C4 and C4-C5. Acquired fusion at the C5-C6 level, across the disc space. Moderate loss disc height at C6-C7. There are endplate osteophytes at multiple levels moderate neural foraminal narrowing is noted on the right at C3-C4. No convincing disc herniation. Upper chest: No acute or significant abnormality. Other: None IMPRESSION: HEAD CT 1. No acute intracranial abnormalities.  No skull fracture. 2.  Generalized atrophy advanced for age. 3. Significant sinus disease including right frontal, bilateral maxillary and bilateral sphenoid sinus air-fluid levels. Suspect acute sinusitis. CERVICAL CT 1. No fracture or acute finding. Electronically Signed   By: Lajean Manes M.D.   On: 08/02/2016 12:14   Ct Cervical Spine Wo Contrast  Result Date: 08/02/2016 CLINICAL DATA:  Per EMS, patient reports falling outside of his residence. Patient reports drinking 2 40s this morning. Patient is unsure of how he fell. No obvious injuries noted. No C-collar in place m Patient has chronic neck/back pain. Patient conscious, alert, oriented.EMS auscultated wheezing in all fields. Patient received albuterol en route with EMS.Hx of stroke 4 years ago; weakness to  right side from this stroke. EXAM: CT HEAD WITHOUT CONTRAST CT CERVICAL SPINE WITHOUT CONTRAST TECHNIQUE: Multidetector CT imaging of the head and cervical spine was performed following the standard protocol without intravenous contrast. Multiplanar CT image reconstructions of the cervical spine were also generated. COMPARISON:  12/14/2015 FINDINGS: CT HEAD FINDINGS Brain: The ventricles are normal in configuration. There is ventricular and sulcal enlargement reflecting mild to moderate atrophy, advanced for age. No hydrocephalus. There are no parenchymal masses or mass effect. There is no evidence of an infarct. There are no extra-axial masses or abnormal fluid collections. There is no intracranial hemorrhage. Vascular: No hyperdense vessel or unexpected calcification. Skull: Normal. Negative for fracture or focal lesion. Sinuses/Orbits: Dependent fluid is noted in the maxillary and sphenoid sinuses and right frontal sinus. Moderate mucosal thickening lines the ethmoid air cells with mild mucosal thickening noted along the sphenoid sinuses, inferior right frontal sinus and maxillary sinuses. Clear mastoid air cells. Globes orbits are unremarkable. Other: None. CT CERVICAL  SPINE FINDINGS Alignment: Reversal of the normal cervical lordosis centered at C4. No spondylolisthesis. Skull base and vertebrae: No acute fracture. No primary bone lesion or focal pathologic process. Soft tissues and spinal canal: No prevertebral fluid or swelling. No visible canal hematoma. Disc levels: Loss of disc height throughout the cervical spine. There is moderate loss of disc height at C3-C4 and C4-C5. Acquired fusion at the C5-C6 level, across the disc space. Moderate loss disc height at C6-C7. There are endplate osteophytes at multiple levels moderate neural foraminal narrowing is noted on the right at C3-C4. No convincing disc herniation. Upper chest: No acute or significant abnormality. Other: None IMPRESSION: HEAD CT 1. No acute intracranial abnormalities.  No skull fracture. 2. Generalized atrophy advanced for age. 3. Significant sinus disease including right frontal, bilateral maxillary and bilateral sphenoid sinus air-fluid levels. Suspect acute sinusitis. CERVICAL CT 1. No fracture or acute finding. Electronically Signed   By: Lajean Manes M.D.   On: 08/02/2016 12:14    Scheduled Meds: . enoxaparin (LOVENOX) injection  40 mg Subcutaneous Q24H  . folic acid  1 mg Oral Daily  . gabapentin  100 mg Oral TID  . ipratropium-albuterol  3 mL Nebulization TID  . methylPREDNISolone (SOLU-MEDROL) injection  60 mg Intravenous Q8H  . metoprolol tartrate  25 mg Oral QAC breakfast  . multivitamin with minerals  1 tablet Oral Daily  . nicotine  21 mg Transdermal Daily  . potassium chloride  40 mEq Oral BID  . sodium chloride flush  3 mL Intravenous Q12H  . thiamine  100 mg Oral Daily   Or  . thiamine  100 mg Intravenous Daily   Continuous Infusions: . dextrose 5% lactated ringers 125 mL/hr at 08/03/16 1120     LOS: 1 day   Lord Lancour, Orpah Melter, MD [facility] Pager [phone]  If 7PM-7AM, please contact night-coverage [email or web address] Password [email or web address] 08/03/2016, 5:55 PM

## 2016-08-03 NOTE — Progress Notes (Signed)
Initial Nutrition Assessment  DOCUMENTATION CODES:   Non-severe (moderate) malnutrition in context of social or environmental circumstances  INTERVENTION:  - Continue encourage PO intakes of meals. - Monitor magnesium, potassium, and phosphorus daily for at least 3 days, MD to replete as needed, as pt is at risk for refeeding syndrome given alcohol abuse with poor intakes of food and non-alcoholic beverages PTA and current IVF. - Will order daily multivitamin with minerals. - RD will continue to monitor for additional nutrition-related needs.  NUTRITION DIAGNOSIS:   Inadequate oral intake related to social / environmental circumstances as evidenced by per patient/family report.  GOAL:   Patient will meet greater than or equal to 90% of their needs  MONITOR:   PO intake, Weight trends, Labs, I & O's  REASON FOR ASSESSMENT:   Malnutrition Screening Tool  ASSESSMENT:   61 y.o. male with medical history of alcohol abuse, tobacco abuse, COPD, and depression was found unresponsive by a bystander. The last thing the patient remembered was going to a local [employer] to get beer and wine. On 08/01/2016, the patient drank 4 x 40 ounces of beer.  On the morning of December 31 2016, the patient states that he drank a whole bottle of wine which was the last thing he remembered. The next thing he remembered was waking up on the sidewalk with EMS at his side.  The patient has been complaining of chest pain for the past 3-4 weeks. He states that it is constant substernal and dull in nature with no alleviating or aggravating factors. He states that he has some dyspnea on exertion which has been constant without significant worsening. He has been taking Goody's 6-8 per day for the past 3-4 weeks. He denies any fevers, chills, headache, neck pain, diarrhea, dysuria, hematuria. The patient had 3 episodes of nausea and vomiting on the day of admission. He continues to smoke up to 2 packs per day.  Pt seen for  MST. BMI indicates normal weight/borderline overweight. Pt ate 75% of breakfast (tray visualized during rounds this AM). He states RN to order lunch of white rice with gravy, pinto beans, and pot roast and for dinner he plans to have [place], macaroni, and cheese, and collard greens (pt will be unable to have macaroni and cheese d/t Heart Healthy diet). He states that he has a good appetite currently but that PTA he was drinking 3-4 40s/day and would often go 4+ days without eating. He reports last alcoholic drink was last week.   He reports abdominal pain and nausea which is not worsened by PO intakes. Pt reports chronic abdominal pain and that pain pills make it better and Gabriel Earing Powder makes it worse.   Physical assessment shows mild to moderate muscle wasting around clavicle and shoulder area, no fat wasting, no edema. Pt reports he weighed himself a few weeks ago and that he was 138 lbs. He reports this is a usual weight for him and that max weight has been 165 lbs and was 3 years ago. Pt meets criteria for malnutrition based on muscle wasting and <50% intakes for >/= 1 month d/t alcohol abuse.   Medications reviewed; PRN Maalox, 1 mg oral folic acid/day, 60 mg IV Solu-merol TID, PRN Zofran, 40 mEq oral KCl BID, 100 mg oral thiamine/day. Labs reviewed; K: 3.2 mmol/L, Cl: 99 mmol/L, Ca: 7.9 mg/dL, AST/ALT elevated.   IVF: D5-LR @ 125 mL/hr (510 kcal).   Diet Order:  Diet Heart Room service appropriate? Yes; Fluid  consistency: Thin  Skin:  Reviewed, no issues  Last BM:  1/4  Height:   Ht Readings from Last 1 Encounters:  08/02/16 5\' 4"  (1.626 m)    Weight:   Wt Readings from Last 1 Encounters:  08/02/16 145 lb (65.8 kg)    Ideal Body Weight:  59.09 kg  BMI:  Body mass index is 24.89 kg/m.  Estimated Nutritional Needs:   Kcal:  1645-1845 (25-28 kcal/kg)  Protein:  65-80 grams (1-1.2 grams/kg)  Fluid:  >/= 1.7 L/day  EDUCATION NEEDS:   No education needs identified at this  time    Jarome Matin, MS, RD, LDN, CNSC Inpatient Clinical Dietitian Pager # [phone] After hours/weekend pager # [phone]

## 2016-08-03 NOTE — Progress Notes (Signed)
  Echocardiogram 2D Echocardiogram has been performed.  Roseanna Rainbow R 08/03/2016, 1:16 PM

## 2016-08-04 DIAGNOSIS — E440 Moderate protein-calorie malnutrition: Secondary | ICD-10-CM

## 2016-08-04 LAB — BASIC METABOLIC PANEL
ANION GAP: 8 (ref 5–15)
BUN: 9 mg/dL (ref 6–20)
CO2: 29 mmol/L (ref 22–32)
Calcium: 8.2 mg/dL — ABNORMAL LOW (ref 8.9–10.3)
Chloride: 100 mmol/L — ABNORMAL LOW (ref 101–111)
Creatinine, Ser: 0.57 mg/dL — ABNORMAL LOW (ref 0.61–1.24)
Glucose, Bld: 159 mg/dL — ABNORMAL HIGH (ref 65–99)
POTASSIUM: 3.5 mmol/L (ref 3.5–5.1)
SODIUM: 137 mmol/L (ref 135–145)

## 2016-08-04 LAB — URINE CULTURE: Culture: NO GROWTH

## 2016-08-04 LAB — HEMOGLOBIN A1C
Hgb A1c MFr Bld: 5.3 % (ref 4.8–5.6)
MEAN PLASMA GLUCOSE: 105 mg/dL

## 2016-08-04 LAB — CBC
HEMATOCRIT: 30 % — AB (ref 39.0–52.0)
Hemoglobin: 9.7 g/dL — ABNORMAL LOW (ref 13.0–17.0)
MCH: 23.7 pg — ABNORMAL LOW (ref 26.0–34.0)
MCHC: 32.3 g/dL (ref 30.0–36.0)
MCV: 73.3 fL — ABNORMAL LOW (ref 78.0–100.0)
Platelets: 401 10*3/uL — ABNORMAL HIGH (ref 150–400)
RBC: 4.09 MIL/uL — AB (ref 4.22–5.81)
RDW: 20.7 % — AB (ref 11.5–15.5)
WBC: 18.6 10*3/uL — AB (ref 4.0–10.5)

## 2016-08-04 MED ORDER — METOPROLOL TARTRATE 50 MG PO TABS
50.0000 mg | ORAL_TABLET | Freq: Every day | ORAL | Status: DC
  Administered 2016-08-05: 50 mg via ORAL
  Filled 2016-08-04 (×2): qty 1

## 2016-08-04 MED ORDER — LABETALOL HCL 5 MG/ML IV SOLN
10.0000 mg | INTRAVENOUS | Status: DC | PRN
  Filled 2016-08-04 (×2): qty 4

## 2016-08-04 NOTE — Progress Notes (Addendum)
PROGRESS NOTE    Samuel Reyes  [id number] DOB: 1956-01-13 DOA: 08/02/2016 PCP: Maren Reamer, MD    Brief Narrative:  61 y.o. male with medical history of alcohol abuse, tobacco abuse, COPD, and depression was found unresponsive by a bystander. The last thing the patient remembered was going to a local [employer] to get beer and wine. On 08/01/2016, the patient drank 4 x 40 ounces of beer.  On the morning of December 31 2016, the patient states that he drank a whole bottle of wine which was the last thing he remembered. The next thing he remembered was waking up on the sidewalk with EMS at his side.  The patient has been complaining of chest pain for the past 3-4 weeks. He states that it is constant substernal and dull in nature with no alleviating or aggravating factors. He states that he has some dyspnea on exertion which has been constant without significant worsening. He has been taking Goody's 6-8 per day for the past 3-4 weeks. He denies any fevers, chills, headache, neck pain, diarrhea, dysuria, hematuria. The patient had 3 episodes of nausea and vomiting on the day of admission. He continues to smoke up to 2 packs per day.  In the emergency department, the patient was noted to be tachycardic with heart rate 130-140 with a stable blood pressure. He was also hypoxemic with oxygen saturation 88-90% on room air. The patient was given 1 hour nebulizer, IV Solu-Medrol. CT of the brain negative for acute findings. CT cervical spine negative for fracture. Chest x-ray showed mild bronchitic changes without consolidation. Admission was requested thereafter. I started the patient on diltiazem for his atrial tachycardia, and the patient was admitted to the stepdown unit  Assessment & Plan:   Active Problems:   Acute respiratory failure with hypoxia (HCC)   Alcoholism /alcohol abuse (HCC)   COPD exacerbation (HCC)   Alcohol-induced mood disorder (HCC)   Atrial tachycardia (HCC)   Impaired glucose  tolerance   Malnutrition of moderate degree  Acute respiratory failure with hypoxia -Secondary to COPD exacerbation -Continue IV Solu-Medrol -Patient continued on duo nebs -Currently stable on minimal O2 support  COPD exacerbation -cont IV steroids and scheduled breathing tx per above -Continues to have wheezing on exam, albeit improved  Sinus Tach -HR presently controlled -TSH within normal limits -Echocardiogram on 1/5 reviewed with normal LVEF  Nausea and vomiting -Likely due to alcohol intoxication -Lipase normal -UA and urine culture -Alcohol level 199 at presentation -Stable at present  Atypical chest pain -serial trop neg -D-dimer borderline elevated at 0.62 -chest pain likely secondary to copd exacerbation -Will continue to monitor. Chest pain free at present  Alcohol abuse -Continue CIWA -Last drink was the morning of admission -Cont to hold Welbutrin secondary to decreased seizure threshold  -Stable at present  Tobacco abuse -Tobacco cessation discussed -remains stable at present  Impaired glucose tolerance -Hgb a1c pending -11/16/2015 hemoglobin A1c 5.9 -current stable  Moderate protein calorie malnutriation -Nutrition following  DVT prophylaxis: Lovenox subq Code Status: Full Family Communication: Pt in room, family not at bedside Disposition Plan: uncertain at this time  Consultants:     Procedures:     Antimicrobials: Anti-infectives    Start     Dose/Rate Route Frequency Ordered Stop   08/02/16 1300  levofloxacin (LEVAQUIN) tablet 750 mg     750 mg Oral  Once 08/02/16 1245 08/02/16 1313      Subjective: Without complaints  Objective: Vitals:   08/04/16 0827  08/04/16 1125 08/04/16 1408 08/04/16 1529  BP:    (!) 147/80  Pulse:  (!) 102  (!) 114  Resp:    18  Temp:    98.2 F (36.8 C)  TempSrc:    Oral  SpO2: 99% 96% 96% 96%  Weight:      Height:        Intake/Output Summary (Last 24 hours) at 08/04/16  1627 Last data filed at 08/04/16 1529  Gross per 24 hour  Intake             2720 ml  Output             2400 ml  Net              320 ml   Filed Weights   08/02/16 1035  Weight: 65.8 kg (145 lb)    Examination:  General exam: Sitting in chair, conversant, in nad Respiratory system: normal resp effort, end-expiratory wheezing Cardiovascular system: regular rhythm, s1, s2 on auscultation Gastrointestinal system: soft, pos BS, nondistended Central nervous system: cn2-12 grossly intact, strength intact Extremities: perfused, no clubbing Skin: Normal skin turgor, no notable skin lesions seen Psychiatry: mood normal//no auditory hallucinations  Data Reviewed: I have personally reviewed following labs and imaging studies  CBC:  Recent Labs Lab 08/02/16 1302 08/03/16 0658 08/04/16 0548  WBC 7.7 18.9* 18.6*  NEUTROABS 5.2  --   --   HGB 11.0* 9.9* 9.7*  HCT 34.1* 30.9* 30.0*  MCV 72.4* 72.2* 73.3*  PLT 451* 404* 123XX123*   Basic Metabolic Panel:  Recent Labs Lab 08/02/16 1302 08/03/16 0658 08/04/16 0548  NA 135 135 137  K 3.7 3.2* 3.5  CL 101 99* 100*  CO2 22 26 29   GLUCOSE 94 199* 159*  BUN <5* 6 9  CREATININE 0.66 0.57* 0.57*  CALCIUM 8.2* 7.9* 8.2*   GFR: Estimated Creatinine Clearance: 81.2 mL/min (by C-G formula based on SCr of 0.57 mg/dL (L)). Liver Function Tests:  Recent Labs Lab 08/02/16 1302  AST 124*  ALT 96*  ALKPHOS 97  BILITOT 0.1*  PROT 7.1  ALBUMIN 3.7    Recent Labs Lab 08/02/16 1926  LIPASE 28   No results for input(s): AMMONIA in the last 168 hours. Coagulation Profile: No results for input(s): INR, PROTIME in the last 168 hours. Cardiac Enzymes:  Recent Labs Lab 08/02/16 1302 08/02/16 1836 08/03/16 0036 08/03/16 0658  CKTOTAL 25*  --   --   --   TROPONINI  --  <0.03 <0.03 <0.03   BNP (last 3 results) No results for input(s): PROBNP in the last 8760 hours. HbA1C:  Recent Labs  08/03/16 0658  HGBA1C 5.3   CBG: No  results for input(s): GLUCAP in the last 168 hours. Lipid Profile: No results for input(s): CHOL, HDL, LDLCALC, TRIG, CHOLHDL, LDLDIRECT in the last 72 hours. Thyroid Function Tests:  Recent Labs  08/02/16 1836  TSH 0.365   Anemia Panel: No results for input(s): VITAMINB12, FOLATE, FERRITIN, TIBC, IRON, RETICCTPCT in the last 72 hours. Sepsis Labs: No results for input(s): PROCALCITON, LATICACIDVEN in the last 168 hours.  Recent Results (from the past 240 hour(s))  Urine culture     Status: None   Collection Time: 08/02/16  7:00 PM  Result Value Ref Range Status   Specimen Description URINE, CLEAN CATCH  Final   Special Requests NONE  Final   Culture NO GROWTH Performed at [facility]   Final   Report Status 08/04/2016  FINAL  Final  MRSA PCR Screening     Status: None   Collection Time: 08/02/16  8:41 PM  Result Value Ref Range Status   MRSA by PCR NEGATIVE NEGATIVE Final    Comment:        The GeneXpert MRSA Assay (FDA approved for NASAL specimens only), is one component of a comprehensive MRSA colonization surveillance program. It is not intended to diagnose MRSA infection nor to guide or monitor treatment for MRSA infections.      Radiology Studies: No results found.  Scheduled Meds: . enoxaparin (LOVENOX) injection  40 mg Subcutaneous Q24H  . folic acid  1 mg Oral Daily  . gabapentin  100 mg Oral TID  . ipratropium-albuterol  3 mL Nebulization TID  . methylPREDNISolone (SOLU-MEDROL) injection  60 mg Intravenous Q8H  . metoprolol tartrate  25 mg Oral QAC breakfast  . multivitamin with minerals  1 tablet Oral Daily  . nicotine  21 mg Transdermal Daily  . sodium chloride flush  3 mL Intravenous Q12H  . thiamine  100 mg Oral Daily   Or  . thiamine  100 mg Intravenous Daily   Continuous Infusions: . dextrose 5% lactated ringers 125 mL/hr at 08/04/16 0348     LOS: 2 days   CHIU, Orpah Melter, MD [facility] Pager [phone]  If 7PM-7AM,  please contact night-coverage [email or web address] Password [facility] 08/04/2016, 4:27 PM

## 2016-08-04 NOTE — Evaluation (Signed)
Physical Therapy Evaluation Patient Details Name: Samuel Reyes MRN: [id number] DOB: Apr 13, 1956 Today's Date: 08/04/2016   History of Present Illness  61 y.o. male with medical history of alcohol abuse, tobacco abuse, COPD, and depression was found unresponsive by a bystander. noted to be hypoxic, and atrial tachycardia.   Clinical Impression  Pt assessed to have a little weakness and instability with his gait. Feel he would benefit from PT while here in acute care, however will likely not need f/u PT post acute.    Follow Up Recommendations No PT follow up    Equipment Recommendations  Cane    Recommendations for Other Services       Precautions / Restrictions Precautions Precautions: Fall Restrictions Weight Bearing Restrictions: No      Mobility  Bed Mobility Overal bed mobility: Independent                Transfers Overall transfer level: Needs assistance Equipment used: Rolling walker (2 wheeled) Transfers: Sit to/from Stand Sit to Stand: Min guard            Ambulation/Gait Ambulation/Gait assistance: Min guard Ambulation Distance (Feet): 170 Feet Assistive device: Rolling walker (2 wheeled) Gait Pattern/deviations: Step-through pattern     General Gait Details: little unsteady, did nto feel comfortable going with no AD as he usually does. Hopes to get to that point before he leaves the hospital  Stairs            Wheelchair Mobility    Modified Rankin (Stroke Patients Only)       Balance                                             Pertinent Vitals/Pain Pain Assessment:  (states he has pain in his right knee , constant dull at times. unrated. )    Home Living Family/patient expects to be discharged to:: Private residence Living Arrangements: Non-relatives/Friends (lives with 2 friends) Available Help at Discharge: Friend(s) Type of Home: Apartment Home Access: Elevator     Home Layout: One level Home  Equipment: None Additional Comments: used to hav a cane but it was stolen. Uses it to support his right knee when it gives out. would like another cane if able. Also he no longer has his eye glasses and really would be helpful if he could be given advice on how to get another pair.     Prior Function Level of Independence: Independent               Hand Dominance        Extremity/Trunk Assessment        Lower Extremity Assessment Lower Extremity Assessment: Generalized weakness       Communication   Communication: No difficulties  Cognition Arousal/Alertness: Awake/alert Behavior During Therapy: WFL for tasks assessed/performed Overall Cognitive Status: Within Functional Limits for tasks assessed                      General Comments      Exercises     Assessment/Plan    PT Assessment Patient needs continued PT services  PT Problem List Decreased strength;Decreased activity tolerance;Decreased mobility          PT Treatment Interventions Gait training;Functional mobility training;Patient/family education    PT Goals (Current goals can be found in the Care Plan section)  Acute Rehab  PT Goals Patient Stated Goal: I want to be able to walk again aget around. did comment on how he should just go be with the rest of his family (his wife and kids) for they are dead and he might as well go out back and die too.  PT Goal Formulation: With patient Time For Goal Achievement: 08/11/16 Potential to Achieve Goals: Good    Frequency Min 3X/week   Barriers to discharge        Co-evaluation               End of Session Equipment Utilized During Treatment: Gait belt Activity Tolerance: Patient tolerated treatment well Patient left: in chair;with chair alarm set;with call bell/phone within reach Nurse Communication: Mobility status (could walk with nursing assiting with RW )         Time: 1005-1015 PT Time Calculation (min) (ACUTE ONLY): 10  min   Charges:   PT Evaluation $PT Eval Low Complexity: 1 Procedure     PT G CodesClide Dales Sep 02, 2016, 11:34 AM Clide Dales, PT Pager: [phone] 2016-09-02

## 2016-08-05 ENCOUNTER — Inpatient Hospital Stay (HOSPITAL_COMMUNITY): Payer: Medicaid Other

## 2016-08-05 DIAGNOSIS — R0602 Shortness of breath: Secondary | ICD-10-CM

## 2016-08-05 LAB — BASIC METABOLIC PANEL
Anion gap: 10 (ref 5–15)
BUN: 12 mg/dL (ref 6–20)
CHLORIDE: 101 mmol/L (ref 101–111)
CO2: 26 mmol/L (ref 22–32)
CREATININE: 0.48 mg/dL — AB (ref 0.61–1.24)
Calcium: 8.1 mg/dL — ABNORMAL LOW (ref 8.9–10.3)
GFR calc Af Amer: >60 mL/min (ref >60)
GFR calc non Af Amer: >60 mL/min (ref >60)
GLUCOSE: 114 mg/dL — AB (ref 65–99)
Potassium: 3.5 mmol/L (ref 3.5–5.1)
SODIUM: 137 mmol/L (ref 135–145)

## 2016-08-05 LAB — MAGNESIUM: Magnesium: 1.8 mg/dL (ref 1.7–2.4)

## 2016-08-05 MED ORDER — IPRATROPIUM-ALBUTEROL 0.5-2.5 (3) MG/3ML IN SOLN
3.0000 mL | Freq: Two times a day (BID) | RESPIRATORY_TRACT | Status: DC
  Administered 2016-08-05 – 2016-08-06 (×2): 3 mL via RESPIRATORY_TRACT
  Filled 2016-08-05 (×2): qty 3

## 2016-08-05 MED ORDER — GI COCKTAIL ~~LOC~~
30.0000 mL | Freq: Three times a day (TID) | ORAL | Status: DC | PRN
  Administered 2016-08-05 – 2016-08-06 (×2): 30 mL via ORAL
  Filled 2016-08-05 (×2): qty 30

## 2016-08-05 MED ORDER — PANTOPRAZOLE SODIUM 40 MG PO TBEC
40.0000 mg | DELAYED_RELEASE_TABLET | Freq: Every day | ORAL | Status: DC
  Administered 2016-08-05 – 2016-08-06 (×2): 40 mg via ORAL
  Filled 2016-08-05 (×2): qty 1

## 2016-08-05 MED ORDER — METHYLPREDNISOLONE SODIUM SUCC 125 MG IJ SOLR: 60.0000 mg | Freq: Two times a day (BID) | INTRAMUSCULAR | Status: DC

## 2016-08-05 MED ORDER — BENZONATATE 100 MG PO CAPS
100.0000 mg | ORAL_CAPSULE | Freq: Three times a day (TID) | ORAL | Status: DC | PRN
  Administered 2016-08-05: 100 mg via ORAL
  Filled 2016-08-05: qty 1

## 2016-08-05 NOTE — Progress Notes (Signed)
*  Preliminary Results* Bilateral lower extremity venous duplex completed. Bilateral lower extremities are negative for deep vein thrombosis. There is no evidence of Baker's cyst bilaterally.  08/05/2016 9:47 AM Maudry Mayhew, BS, RVT, RDCS, RDMS

## 2016-08-05 NOTE — Progress Notes (Signed)
PROGRESS NOTE    Samuel Reyes  [id number] DOB: [birth year] DOA: 08/02/2016 PCP: Maren Reamer, MD    Brief Narrative:  61 y.o. male with medical history of alcohol abuse, tobacco abuse, COPD, and depression was found unresponsive by a bystander. The last thing the patient remembered was going to a local [employer] to get beer and wine. On 08/01/2016, the patient drank 4 x 40 ounces of beer.  On the morning of December 31 2016, the patient states that he drank a whole bottle of wine which was the last thing he remembered. The next thing he remembered was waking up on the sidewalk with EMS at his side.  The patient has been complaining of chest pain for the past 3-4 weeks. He states that it is constant substernal and dull in nature with no alleviating or aggravating factors. He states that he has some dyspnea on exertion which has been constant without significant worsening. He has been taking Goody's 6-8 per day for the past 3-4 weeks. He denies any fevers, chills, headache, neck pain, diarrhea, dysuria, hematuria. The patient had 3 episodes of nausea and vomiting on the day of admission. He continues to smoke up to 2 packs per day.  In the emergency department, the patient was noted to be tachycardic with heart rate 130-140 with a stable blood pressure. He was also hypoxemic with oxygen saturation 88-90% on room air. The patient was given 1 hour nebulizer, IV Solu-Medrol. CT of the brain negative for acute findings. CT cervical spine negative for fracture. Chest x-ray showed mild bronchitic changes without consolidation. Admission was requested thereafter. I started the patient on diltiazem for his atrial tachycardia, and the patient was admitted to the stepdown unit  Assessment & Plan:   Active Problems:   Acute respiratory failure with hypoxia (HCC)   Alcoholism /alcohol abuse (HCC)   COPD exacerbation (HCC)   Alcohol-induced mood disorder (HCC)   Atrial tachycardia (HCC)   Impaired glucose  tolerance   Malnutrition of moderate degree  Acute respiratory failure with hypoxia -Secondary to COPD exacerbation -Continue IV Solu-Medrol -Patient continued on duo nebs -Remains with end-expiratory wheezing on exam -On min O2 support  COPD exacerbation -cont IV steroids and scheduled breathing tx per above -Wheezing auscultated on exam, improved since admission  Sinus Tach -HR currently controlled -Patient noted to be tachycardic 1/6 -Metoprolol dose increased to 50mg  and IV labetalol PRN added -TSH within normal limits -Echocardiogram on 1/5 reviewed with normal LVEF  Nausea and vomiting -Likely due to alcohol intoxication -Lipase normal -UA and urine culture -Alcohol level 199 at presentation -Remains stable  Atypical chest pain -serial trop neg -D-dimer borderline elevated at 0.62. LE dopplers neg for DVT -chest pain likely secondary to copd exacerbation -Stable at present.   Alcohol abuse -Continue CIWA -Last drink was the morning of admission -Cont to hold Welbutrin secondary to decreased seizure threshold  -Remains stable currently  Tobacco abuse -Tobacco cessation discussed -Currently stable  Impaired glucose tolerance -Hgb a1c pending -11/16/2015 hemoglobin A1c 5.9 -Random glucose stable  Moderate protein calorie malnutriation -Nutrition following -Presently stable  DVT prophylaxis: Lovenox subq Code Status: Full Family Communication: Pt in room, family not at bedside Disposition Plan: uncertain at this time  Consultants:     Procedures:     Antimicrobials: Anti-infectives    Start     Dose/Rate Route Frequency Ordered Stop   08/02/16 1300  levofloxacin (LEVAQUIN) tablet 750 mg     750 mg Oral  Once  08/02/16 1245 08/02/16 1313      Subjective: Reports feeling somewhat better today  Objective: Vitals:   08/04/16 2140 08/05/16 0503 08/05/16 0813 08/05/16 1432  BP: (!) 141/75 135/80  (!) 141/84  Pulse: (!) 117 97  96    Resp: (!) 21 19  19   Temp: 98.5 F (36.9 C) 98.7 F (37.1 C)  98.2 F (36.8 C)  TempSrc: Oral Oral  Oral  SpO2: 96% 97% 97% 94%  Weight:      Height:        Intake/Output Summary (Last 24 hours) at 08/05/16 1553 Last data filed at 08/05/16 1433  Gross per 24 hour  Intake             3220 ml  Output             2401 ml  Net              819 ml   Filed Weights   08/02/16 1035  Weight: 65.8 kg (145 lb)    Examination:  General exam: sitting in bed, awake, in nad Respiratory system: normal chest rise, end-expiratory wheezing that sounds improved Cardiovascular system: regular rate, s1-s2 Gastrointestinal system: no masses, pos BS, nondistended Central nervous system: no seizures, no tremors Extremities: no cyanosis, no joint deformities Skin: no pallor, multiple tattoos noted Psychiatry: affect normal//no visual hallucinations  Data Reviewed: I have personally reviewed following labs and imaging studies  CBC:  Recent Labs Lab 08/02/16 1302 08/03/16 0658 08/04/16 0548  WBC 7.7 18.9* 18.6*  NEUTROABS 5.2  --   --   HGB 11.0* 9.9* 9.7*  HCT 34.1* 30.9* 30.0*  MCV 72.4* 72.2* 73.3*  PLT 451* 404* 123XX123*   Basic Metabolic Panel:  Recent Labs Lab 08/02/16 1302 08/03/16 0658 08/04/16 0548 08/05/16 0558  NA 135 135 137 137  K 3.7 3.2* 3.5 3.5  CL 101 99* 100* 101  CO2 22 26 29 26   GLUCOSE 94 199* 159* 114*  BUN <5* 6 9 12   CREATININE 0.66 0.57* 0.57* 0.48*  CALCIUM 8.2* 7.9* 8.2* 8.1*  MG  --   --   --  1.8   GFR: Estimated Creatinine Clearance: 81.2 mL/min (by C-G formula based on SCr of 0.48 mg/dL (L)). Liver Function Tests:  Recent Labs Lab 08/02/16 1302  AST 124*  ALT 96*  ALKPHOS 97  BILITOT 0.1*  PROT 7.1  ALBUMIN 3.7    Recent Labs Lab 08/02/16 1926  LIPASE 28   No results for input(s): AMMONIA in the last 168 hours. Coagulation Profile: No results for input(s): INR, PROTIME in the last 168 hours. Cardiac Enzymes:  Recent  Labs Lab 08/02/16 1302 08/02/16 1836 08/03/16 0036 08/03/16 0658  CKTOTAL 25*  --   --   --   TROPONINI  --  <0.03 <0.03 <0.03   BNP (last 3 results) No results for input(s): PROBNP in the last 8760 hours. HbA1C:  Recent Labs  08/03/16 0658  HGBA1C 5.3   CBG: No results for input(s): GLUCAP in the last 168 hours. Lipid Profile: No results for input(s): CHOL, HDL, LDLCALC, TRIG, CHOLHDL, LDLDIRECT in the last 72 hours. Thyroid Function Tests:  Recent Labs  08/02/16 1836  TSH 0.365   Anemia Panel: No results for input(s): VITAMINB12, FOLATE, FERRITIN, TIBC, IRON, RETICCTPCT in the last 72 hours. Sepsis Labs: No results for input(s): PROCALCITON, LATICACIDVEN in the last 168 hours.  Recent Results (from the past 240 hour(s))  Urine culture  Status: None   Collection Time: 08/02/16  7:00 PM  Result Value Ref Range Status   Specimen Description URINE, CLEAN CATCH  Final   Special Requests NONE  Final   Culture NO GROWTH Performed at [facility]   Final   Report Status 08/04/2016 FINAL  Final  MRSA PCR Screening     Status: None   Collection Time: 08/02/16  8:41 PM  Result Value Ref Range Status   MRSA by PCR NEGATIVE NEGATIVE Final    Comment:        The GeneXpert MRSA Assay (FDA approved for NASAL specimens only), is one component of a comprehensive MRSA colonization surveillance program. It is not intended to diagnose MRSA infection nor to guide or monitor treatment for MRSA infections.      Radiology Studies: No results found.  Scheduled Meds: . enoxaparin (LOVENOX) injection  40 mg Subcutaneous Q24H  . folic acid  1 mg Oral Daily  . gabapentin  100 mg Oral TID  . ipratropium-albuterol  3 mL Nebulization BID  . methylPREDNISolone (SOLU-MEDROL) injection  60 mg Intravenous Q8H  . metoprolol tartrate  50 mg Oral QAC breakfast  . multivitamin with minerals  1 tablet Oral Daily  . nicotine  21 mg Transdermal Daily  . pantoprazole  40 mg  Oral Daily  . sodium chloride flush  3 mL Intravenous Q12H  . thiamine  100 mg Oral Daily   Or  . thiamine  100 mg Intravenous Daily   Continuous Infusions: . dextrose 5% lactated ringers 125 mL/hr at 08/04/16 0348     LOS: 3 days   Lucianna Ostlund, Orpah Melter, MD [facility] Pager [phone]  If 7PM-7AM, please contact night-coverage [email or web address] Password [facility] 08/05/2016, 3:53 PM

## 2016-08-06 MED ORDER — PREDNISONE 5 MG PO TABS: 5.0000 mg | ORAL_TABLET | Freq: Every day | ORAL | Status: DC

## 2016-08-06 MED ORDER — PANTOPRAZOLE SODIUM 40 MG PO TBEC
40.0000 mg | DELAYED_RELEASE_TABLET | Freq: Every day | ORAL | 0 refills | Status: DC
Start: 2016-08-07 — End: 2016-09-03

## 2016-08-06 MED ORDER — METOPROLOL TARTRATE 50 MG PO TABS
50.0000 mg | ORAL_TABLET | Freq: Two times a day (BID) | ORAL | Status: DC
  Administered 2016-08-06: 50 mg via ORAL
  Filled 2016-08-06: qty 1

## 2016-08-06 MED ORDER — METOPROLOL TARTRATE 50 MG PO TABS: 50.0000 mg | ORAL_TABLET | Freq: Two times a day (BID) | ORAL | 0 refills | Status: DC

## 2016-08-06 NOTE — Care Management Note (Signed)
Case Management Note  Patient Details  Name: Samuel Reyes MRN: [id number] Date of Birth: [birth year]  Subjective/Objective:  D/c home no CM needs.                  Action/Plan:d/c home.   Expected Discharge Date:   (unknown)               Expected Discharge Plan:  Home/Self Care  In-House Referral:     Discharge planning Services  CM Consult  Post Acute Care Choice:    Choice offered to:     DME Arranged:    DME Agency:     HH Arranged:    [place] Agency:     Status of Service:  Completed, signed off  If discussed at [employer] of Stay Meetings, dates discussed:    Additional Comments:  Dessa Phi, RN 08/06/2016, 11:32 AM

## 2016-08-06 NOTE — Discharge Summary (Signed)
Physician Discharge Summary  Samuel Reyes [id number] DOB: 1956/03/24 DOA: 08/02/2016  PCP: Maren Reamer, MD  Admit date: 08/02/2016 Discharge date: 08/06/2016  Admitted From: Home Disposition:  Home  Recommendations for Outpatient Follow-up:  1. Follow up with PCP in 2-3 weeks  Discharge Condition:Improved CODE STATUS:Full Diet recommendation: Heart healthy   Brief/Interim Summary: 61 y.o.malewith medical history ofalcohol abuse, tobacco abuse, COPD, and depression was found unresponsive by a bystander. The last thing the patient remembered was going to a local [employer] to get beer and wine. On 08/01/2016, the patient drank 4 x 40 ounces of beer. On the morning of December 31 2016, the patient states that he drank a whole bottle of wine which was the last thing he remembered. The next thing he remembered was waking up on the sidewalk with EMS at his side. The patient has been complaining of chest pain for the past 3-4 weeks. He states that it is constant substernal and dull in nature with no alleviating or aggravating factors. He states that he has some dyspnea on exertion which has been constant without significant worsening. He has been taking Goody's 6-8 per day for the past 3-4 weeks. He denies any fevers, chills, headache, neck pain, diarrhea, dysuria, hematuria. The patient had 3 episodes of nausea and vomiting on the day of admission. He continues to smoke up to 2 packs per day.  In the emergency department, the patient was noted to be tachycardic with heart rate 130-140with a stable blood pressure. He was also hypoxemic with oxygen saturation 88-90% on room air. The patient was given 1 hour nebulizer, IV Solu-Medrol. CT of the brain negative for acute findings. CT cervical spine negative for fracture. Chest x-ray showed mild bronchitic changes without consolidation. Admission was requested thereafter. I started the patient on diltiazem for his atrial tachycardia, and the patient  was admitted to the stepdown unit  Acute respiratory failure with hypoxia -Secondary to COPD exacerbation -Patient was continued with IV Solu-Medrol -Patient continued on duo nebs -O2 was weaned off and patient's wheezing resolved -Patient to complete prednisone taper on discharge  COPD exacerbation -cont IV steroids and scheduled breathing tx while admitted -Wheezing resolved by time of admission -Patient to complete course of prednisone taper at time of discharge  Sinus Tach -Patient noted to be tachycardic 1/6 -Metoprolol dose was increased to 50mg  and IV labetalol PRN added -TSH within normal limits -Echocardiogram on 1/5 reviewed with normal LVEF -Metoprolol tartrate was increased from daily dose to BID  Nausea and vomiting -Likely due to alcohol intoxication -Lipase normal -UA and urine culture -Alcohol level 199 at presentation -Remained stable this admission  Atypical chest pain -serial trop neg -D-dimer borderline elevated at 0.62. LE dopplers neg for DVT -chest pain likely secondary to copd exacerbation -resolved  Alcohol abuse -Continue CIWA -Last drink was the morning of admission -Cont to hold Welbutrin secondary to decreased seizure threshold  -Remained stable -ETOH cessation done at bedside  Tobacco abuse -Tobacco cessation discussed -Currently stable  Impaired glucose tolerance -Hgb a1c pending -11/16/2015 hemoglobin A1c 5.9 -Random glucose stable  Moderate protein calorie malnutriation -Nutrition following -Remained stable  Discharge Diagnoses:  Active Problems:   Acute respiratory failure with hypoxia (HCC)   Alcoholism /alcohol abuse (HCC)   COPD exacerbation (HCC)   Alcohol-induced mood disorder (HCC)   Atrial tachycardia (HCC)   Impaired glucose tolerance   Malnutrition of moderate degree    Discharge Instructions   Allergies as of 08/06/2016  Reactions   Bee Venom Anaphylaxis   Penicillins Rash   Has patient had  a PCN reaction causing immediate rash, facial/tongue/throat swelling, SOB or lightheadedness with hypotension: {Yes Has patient had a PCN reaction causing severe rash involving mucus membranes or skin necrosis: NO Has patient had a PCN reaction that required hospitalization {Yes Has patient had a PCN reaction occurring within the last 10 years: NO If all of the above answers are "NO", then may proceed with Cephalosporin use.      Medication List    TAKE these medications   albuterol 108 (90 Base) MCG/ACT inhaler Commonly known as:  PROVENTIL HFA;VENTOLIN HFA Inhale 1-2 puffs into the lungs every 4 (four) hours as needed for wheezing.   albuterol-ipratropium 18-103 MCG/ACT inhaler Commonly known as:  COMBIVENT Inhale 2 puffs into the lungs every 6 (six) hours as needed for wheezing or shortness of breath.   buPROPion 200 MG 12 hr tablet Commonly known as:  WELLBUTRIN SR Take 1 tablet (200 mg total) by mouth at bedtime.   folic acid 1 MG tablet Commonly known as:  FOLVITE Take 1 tablet (1 mg total) by mouth daily.   gabapentin 100 MG capsule Commonly known as:  NEURONTIN Take 1 capsule (100 mg total) by mouth 3 (three) times daily. For agitation/alcohol withdrawal syndrome   GOODY HEADACHE PO Take 3 packets by mouth daily as needed (headaches).   metoprolol 50 MG tablet Commonly known as:  LOPRESSOR Take 1 tablet (50 mg total) by mouth 2 (two) times daily. What changed:  medication strength  how much to take  when to take this  additional instructions   nicotine 21 mg/24hr patch Commonly known as:  NICODERM CQ - dosed in mg/24 hours Place 1 patch (21 mg total) onto the skin daily. For nicotine addiction   pantoprazole 40 MG tablet Commonly known as:  PROTONIX Take 1 tablet (40 mg total) by mouth daily. Start taking on:  08/07/2016   predniSONE 5 MG tablet Commonly known as:  DELTASONE Take 1 tablet (5 mg total) by mouth daily with breakfast.   thiamine 100 MG  tablet Take 1 tablet (100 mg total) by mouth daily.   traZODone 100 MG tablet Commonly known as:  DESYREL Take 1 tablet (100 mg total) by mouth at bedtime as needed for sleep.      Follow-up Information    Maren Reamer, MD. Schedule an appointment as soon as possible for a visit in 2 week(s).   Specialty:  Internal Medicine Contact information: [address] [place] [address] [phone]          Allergies  Allergen Reactions  . Bee Venom Anaphylaxis  . Penicillins Rash    Has patient had a PCN reaction causing immediate rash, facial/tongue/throat swelling, SOB or lightheadedness with hypotension: {Yes Has patient had a PCN reaction causing severe rash involving mucus membranes or skin necrosis: NO Has patient had a PCN reaction that required hospitalization {Yes Has patient had a PCN reaction occurring within the last 10 years: NO If all of the above answers are "NO", then may proceed with Cephalosporin use.      Procedures/Studies: Dg Chest 2 View  Result Date: 08/02/2016 CLINICAL DATA:  New onset of shortness of breath and wheezing. Patient experience dizziness at home and fell. History of COPD. Current smoker. EXAM: CHEST  2 VIEW COMPARISON:  PA and lateral chest x-ray of January 20, 2016 FINDINGS: The lungs are well-expanded. There is no focal infiltrate.  There is mild stable prominence of the interstitial markings bilaterally. There is no pleural effusion. The heart and pulmonary vascularity are normal. There is calcification in the wall of the aortic arch. The bony thorax exhibits no acute abnormality. IMPRESSION: Mild chronic bronchitic changes, stable. No acute pneumonia nor CHF. Thoracic aortic atherosclerosis. Electronically Signed   By: David  Martinique M.D.   On: 08/02/2016 11:21   Ct Head Wo Contrast  Result Date: 08/02/2016 CLINICAL DATA:  Per EMS, patient reports falling outside of his residence. Patient reports drinking 2 40s this morning. Patient is  unsure of how he fell. No obvious injuries noted. No C-collar in place m Patient has chronic neck/back pain. Patient conscious, alert, oriented.EMS auscultated wheezing in all fields. Patient received albuterol en route with EMS.Hx of stroke 4 years ago; weakness to right side from this stroke. EXAM: CT HEAD WITHOUT CONTRAST CT CERVICAL SPINE WITHOUT CONTRAST TECHNIQUE: Multidetector CT imaging of the head and cervical spine was performed following the standard protocol without intravenous contrast. Multiplanar CT image reconstructions of the cervical spine were also generated. COMPARISON:  12/14/2015 FINDINGS: CT HEAD FINDINGS Brain: The ventricles are normal in configuration. There is ventricular and sulcal enlargement reflecting mild to moderate atrophy, advanced for age. No hydrocephalus. There are no parenchymal masses or mass effect. There is no evidence of an infarct. There are no extra-axial masses or abnormal fluid collections. There is no intracranial hemorrhage. Vascular: No hyperdense vessel or unexpected calcification. Skull: Normal. Negative for fracture or focal lesion. Sinuses/Orbits: Dependent fluid is noted in the maxillary and sphenoid sinuses and right frontal sinus. Moderate mucosal thickening lines the ethmoid air cells with mild mucosal thickening noted along the sphenoid sinuses, inferior right frontal sinus and maxillary sinuses. Clear mastoid air cells. Globes orbits are unremarkable. Other: None. CT CERVICAL SPINE FINDINGS Alignment: Reversal of the normal cervical lordosis centered at C4. No spondylolisthesis. Skull base and vertebrae: No acute fracture. No primary bone lesion or focal pathologic process. Soft tissues and spinal canal: No prevertebral fluid or swelling. No visible canal hematoma. Disc levels: Loss of disc height throughout the cervical spine. There is moderate loss of disc height at C3-C4 and C4-C5. Acquired fusion at the C5-C6 level, across the disc space. Moderate loss  disc height at C6-C7. There are endplate osteophytes at multiple levels moderate neural foraminal narrowing is noted on the right at C3-C4. No convincing disc herniation. Upper chest: No acute or significant abnormality. Other: None IMPRESSION: HEAD CT 1. No acute intracranial abnormalities.  No skull fracture. 2. Generalized atrophy advanced for age. 3. Significant sinus disease including right frontal, bilateral maxillary and bilateral sphenoid sinus air-fluid levels. Suspect acute sinusitis. CERVICAL CT 1. No fracture or acute finding. Electronically Signed   By: Lajean Manes M.D.   On: 08/02/2016 12:14   Ct Cervical Spine Wo Contrast  Result Date: 08/02/2016 CLINICAL DATA:  Per EMS, patient reports falling outside of his residence. Patient reports drinking 2 40s this morning. Patient is unsure of how he fell. No obvious injuries noted. No C-collar in place m Patient has chronic neck/back pain. Patient conscious, alert, oriented.EMS auscultated wheezing in all fields. Patient received albuterol en route with EMS.Hx of stroke 4 years ago; weakness to right side from this stroke. EXAM: CT HEAD WITHOUT CONTRAST CT CERVICAL SPINE WITHOUT CONTRAST TECHNIQUE: Multidetector CT imaging of the head and cervical spine was performed following the standard protocol without intravenous contrast. Multiplanar CT image reconstructions of the cervical spine were also generated.  COMPARISON:  12/14/2015 FINDINGS: CT HEAD FINDINGS Brain: The ventricles are normal in configuration. There is ventricular and sulcal enlargement reflecting mild to moderate atrophy, advanced for age. No hydrocephalus. There are no parenchymal masses or mass effect. There is no evidence of an infarct. There are no extra-axial masses or abnormal fluid collections. There is no intracranial hemorrhage. Vascular: No hyperdense vessel or unexpected calcification. Skull: Normal. Negative for fracture or focal lesion. Sinuses/Orbits: Dependent fluid is noted  in the maxillary and sphenoid sinuses and right frontal sinus. Moderate mucosal thickening lines the ethmoid air cells with mild mucosal thickening noted along the sphenoid sinuses, inferior right frontal sinus and maxillary sinuses. Clear mastoid air cells. Globes orbits are unremarkable. Other: None. CT CERVICAL SPINE FINDINGS Alignment: Reversal of the normal cervical lordosis centered at C4. No spondylolisthesis. Skull base and vertebrae: No acute fracture. No primary bone lesion or focal pathologic process. Soft tissues and spinal canal: No prevertebral fluid or swelling. No visible canal hematoma. Disc levels: Loss of disc height throughout the cervical spine. There is moderate loss of disc height at C3-C4 and C4-C5. Acquired fusion at the C5-C6 level, across the disc space. Moderate loss disc height at C6-C7. There are endplate osteophytes at multiple levels moderate neural foraminal narrowing is noted on the right at C3-C4. No convincing disc herniation. Upper chest: No acute or significant abnormality. Other: None IMPRESSION: HEAD CT 1. No acute intracranial abnormalities.  No skull fracture. 2. Generalized atrophy advanced for age. 3. Significant sinus disease including right frontal, bilateral maxillary and bilateral sphenoid sinus air-fluid levels. Suspect acute sinusitis. CERVICAL CT 1. No fracture or acute finding. Electronically Signed   By: Lajean Manes M.D.   On: 08/02/2016 12:14     Subjective: Eager to go home today  Discharge Exam: Vitals:   08/05/16 2024 08/06/16 0439  BP: (!) 142/87 (!) 152/96  Pulse: (!) 101 91  Resp: 20 18  Temp: 99.1 F (37.3 C) 98 F (36.7 C)   Vitals:   08/05/16 1955 08/05/16 2024 08/06/16 0439 08/06/16 0826  BP:  (!) 142/87 (!) 152/96   Pulse:  (!) 101 91   Resp:  20 18   Temp:  99.1 F (37.3 C) 98 F (36.7 C)   TempSrc:  Oral Oral   SpO2: 95% 93% 94% 96%  Weight:      Height:        General: Pt is alert, awake, not in acute  distress Cardiovascular: RRR, S1/S2 +, no rubs, no gallops Respiratory: CTA bilaterally, no wheezing, no rhonchi Abdominal: Soft, NT, ND, bowel sounds + Extremities: no edema, no cyanosis   The results of significant diagnostics from this hospitalization (including imaging, microbiology, ancillary and laboratory) are listed below for reference.     Microbiology: Recent Results (from the past 240 hour(s))  Urine culture     Status: None   Collection Time: 08/02/16  7:00 PM  Result Value Ref Range Status   Specimen Description URINE, CLEAN CATCH  Final   Special Requests NONE  Final   Culture NO GROWTH Performed at [facility]   Final   Report Status 08/04/2016 FINAL  Final  MRSA PCR Screening     Status: None   Collection Time: 08/02/16  8:41 PM  Result Value Ref Range Status   MRSA by PCR NEGATIVE NEGATIVE Final    Comment:        The GeneXpert MRSA Assay (FDA approved for NASAL specimens only), is one component of  a comprehensive MRSA colonization surveillance program. It is not intended to diagnose MRSA infection nor to guide or monitor treatment for MRSA infections.      Labs: BNP (last 3 results) No results for input(s): BNP in the last 8760 hours. Basic Metabolic Panel:  Recent Labs Lab 08/02/16 1302 08/03/16 0658 08/04/16 0548 08/05/16 0558  NA 135 135 137 137  K 3.7 3.2* 3.5 3.5  CL 101 99* 100* 101  CO2 22 26 29 26   GLUCOSE 94 199* 159* 114*  BUN <5* 6 9 12   CREATININE 0.66 0.57* 0.57* 0.48*  CALCIUM 8.2* 7.9* 8.2* 8.1*  MG  --   --   --  1.8   Liver Function Tests:  Recent Labs Lab 08/02/16 1302  AST 124*  ALT 96*  ALKPHOS 97  BILITOT 0.1*  PROT 7.1  ALBUMIN 3.7    Recent Labs Lab 08/02/16 1926  LIPASE 28   No results for input(s): AMMONIA in the last 168 hours. CBC:  Recent Labs Lab 08/02/16 1302 08/03/16 0658 08/04/16 0548  WBC 7.7 18.9* 18.6*  NEUTROABS 5.2  --   --   HGB 11.0* 9.9* 9.7*  HCT 34.1* 30.9* 30.0*   MCV 72.4* 72.2* 73.3*  PLT 451* 404* 401*   Cardiac Enzymes:  Recent Labs Lab 08/02/16 1302 08/02/16 1836 08/03/16 0036 08/03/16 0658  CKTOTAL 25*  --   --   --   TROPONINI  --  <0.03 <0.03 <0.03   BNP: Invalid input(s): POCBNP CBG: No results for input(s): GLUCAP in the last 168 hours. D-Dimer No results for input(s): DDIMER in the last 72 hours. Hgb A1c No results for input(s): HGBA1C in the last 72 hours. Lipid Profile No results for input(s): CHOL, HDL, LDLCALC, TRIG, CHOLHDL, LDLDIRECT in the last 72 hours. Thyroid function studies No results for input(s): TSH, T4TOTAL, T3FREE, THYROIDAB in the last 72 hours.  Invalid input(s): FREET3 Anemia work up No results for input(s): VITAMINB12, FOLATE, FERRITIN, TIBC, IRON, RETICCTPCT in the last 72 hours. Urinalysis    Component Value Date/Time   COLORURINE YELLOW 08/02/2016 1900   APPEARANCEUR CLEAR 08/02/2016 1900   LABSPEC 1.021 08/02/2016 1900   PHURINE 5.0 08/02/2016 1900   GLUCOSEU >=500 (A) 08/02/2016 1900   HGBUR NEGATIVE 08/02/2016 1900   BILIRUBINUR NEGATIVE 08/02/2016 1900   KETONESUR NEGATIVE 08/02/2016 1900   PROTEINUR NEGATIVE 08/02/2016 1900   UROBILINOGEN 0.2 05/31/2015 2156   NITRITE NEGATIVE 08/02/2016 1900   LEUKOCYTESUR NEGATIVE 08/02/2016 1900   Sepsis Labs Invalid input(s): PROCALCITONIN,  WBC,  LACTICIDVEN Microbiology Recent Results (from the past 240 hour(s))  Urine culture     Status: None   Collection Time: 08/02/16  7:00 PM  Result Value Ref Range Status   Specimen Description URINE, CLEAN CATCH  Final   Special Requests NONE  Final   Culture NO GROWTH Performed at [facility]   Final   Report Status 08/04/2016 FINAL  Final  MRSA PCR Screening     Status: None   Collection Time: 08/02/16  8:41 PM  Result Value Ref Range Status   MRSA by PCR NEGATIVE NEGATIVE Final    Comment:        The GeneXpert MRSA Assay (FDA approved for NASAL specimens only), is one component of  a comprehensive MRSA colonization surveillance program. It is not intended to diagnose MRSA infection nor to guide or monitor treatment for MRSA infections.      SIGNED:   CHIU, Orpah Melter, MD  [facility] 08/06/2016, 11:19 AM  If 7PM-7AM, please contact night-coverage [email or web address] Password [email or web address]

## 2016-08-06 NOTE — Progress Notes (Signed)
Physical Therapy Treatment Patient Details Name: Samuel Reyes MRN: [id number] DOB: [birth year] Today's Date: 08/06/2016    History of Present Illness 61 y.o. male with medical history of alcohol abuse, tobacco abuse, COPD, and depression was found unresponsive by a bystander. noted to be hypoxic, and atrial tachycardia.     PT Comments    Assisted OOB to amb a greater distance.  Mild unsteady gait.  Follow Up Recommendations  No PT follow up     Equipment Recommendations  Cane    Recommendations for Other Services       Precautions / Restrictions Precautions Precautions: Fall Restrictions Weight Bearing Restrictions: No    Mobility  Bed Mobility Overal bed mobility: Modified Independent             General bed mobility comments: with use of rail  Transfers Overall transfer level: Needs assistance Equipment used: None Transfers: Sit to/from Stand;Stand Pivot Transfers Sit to Stand: Supervision;Min guard Stand pivot transfers: Supervision;Min guard       General transfer comment: good use of hands to steady self   Ambulation/Gait Ambulation/Gait assistance: Min guard Ambulation Distance (Feet): 210 Feet Assistive device: Rolling walker (2 wheeled) Gait Pattern/deviations: Step-to pattern;Step-through pattern;Decreased stride length Gait velocity: decreased   General Gait Details: mild unsteady gait with drift to R and L.  Usually amb w/o any AD but admits to holding to furniture/wall rails when needed.     Stairs            Wheelchair Mobility    Modified Rankin (Stroke Patients Only)       Balance                                    Cognition Arousal/Alertness: Awake/alert Behavior During Therapy: WFL for tasks assessed/performed Overall Cognitive Status: Within Functional Limits for tasks assessed                      Exercises      General Comments        Pertinent Vitals/Pain Pain Assessment: No/denies  pain    Home Living                      Prior Function            PT Goals (current goals can now be found in the care plan section) Progress towards PT goals: Progressing toward goals    Frequency    Min 3X/week      PT Plan Current plan remains appropriate    Co-evaluation             End of Session Equipment Utilized During Treatment: Gait belt Activity Tolerance: Patient tolerated treatment well Patient left: in bed;with bed alarm set;with call bell/phone within reach     Time: [id number] PT Time Calculation (min) (ACUTE ONLY): 13 min  Charges:  $Gait Training: 8-22 mins                    G Codes:      Samuel Reyes  PTA [facility]  Acute  Rehab Pager      [phone]

## 2016-08-21 ENCOUNTER — Emergency Department (HOSPITAL_COMMUNITY): Payer: Medicaid Other

## 2016-08-21 ENCOUNTER — Inpatient Hospital Stay (HOSPITAL_COMMUNITY)
Admission: EM | Admit: 2016-08-21 | Discharge: 2016-08-27 | DRG: 190 | Disposition: A | Discharge status: Home / Self Care | Payer: Medicaid Other | Attending: Internal Medicine | Admitting: Internal Medicine

## 2016-08-21 ENCOUNTER — Encounter (HOSPITAL_COMMUNITY): Payer: Self-pay | Admitting: Emergency Medicine

## 2016-08-21 DIAGNOSIS — D509 Iron deficiency anemia, unspecified: Secondary | ICD-10-CM | POA: Diagnosis present

## 2016-08-21 DIAGNOSIS — F1020 Alcohol dependence, uncomplicated: Secondary | ICD-10-CM | POA: Diagnosis present

## 2016-08-21 DIAGNOSIS — R112 Nausea with vomiting, unspecified: Secondary | ICD-10-CM | POA: Diagnosis present

## 2016-08-21 DIAGNOSIS — I10 Essential (primary) hypertension: Secondary | ICD-10-CM | POA: Diagnosis present

## 2016-08-21 DIAGNOSIS — K529 Noninfective gastroenteritis and colitis, unspecified: Secondary | ICD-10-CM | POA: Diagnosis present

## 2016-08-21 DIAGNOSIS — Z903 Acquired absence of stomach [part of]: Secondary | ICD-10-CM

## 2016-08-21 DIAGNOSIS — K219 Gastro-esophageal reflux disease without esophagitis: Secondary | ICD-10-CM | POA: Diagnosis present

## 2016-08-21 DIAGNOSIS — Z91030 Bee allergy status: Secondary | ICD-10-CM | POA: Diagnosis not present

## 2016-08-21 DIAGNOSIS — Z23 Encounter for immunization: Secondary | ICD-10-CM

## 2016-08-21 DIAGNOSIS — Z880 Allergy status to penicillin: Secondary | ICD-10-CM | POA: Diagnosis not present

## 2016-08-21 DIAGNOSIS — J9601 Acute respiratory failure with hypoxia: Secondary | ICD-10-CM | POA: Diagnosis present

## 2016-08-21 DIAGNOSIS — Z79899 Other long term (current) drug therapy: Secondary | ICD-10-CM | POA: Diagnosis not present

## 2016-08-21 DIAGNOSIS — J440 Chronic obstructive pulmonary disease with acute lower respiratory infection: Secondary | ICD-10-CM | POA: Diagnosis not present

## 2016-08-21 DIAGNOSIS — R69 Illness, unspecified: Secondary | ICD-10-CM

## 2016-08-21 DIAGNOSIS — Z981 Arthrodesis status: Secondary | ICD-10-CM

## 2016-08-21 DIAGNOSIS — J441 Chronic obstructive pulmonary disease with (acute) exacerbation: Secondary | ICD-10-CM | POA: Diagnosis present

## 2016-08-21 DIAGNOSIS — K709 Alcoholic liver disease, unspecified: Secondary | ICD-10-CM | POA: Diagnosis present

## 2016-08-21 DIAGNOSIS — IMO0001 Reserved for inherently not codable concepts without codable children: Secondary | ICD-10-CM | POA: Diagnosis present

## 2016-08-21 DIAGNOSIS — E876 Hypokalemia: Secondary | ICD-10-CM | POA: Diagnosis not present

## 2016-08-21 DIAGNOSIS — R200 Anesthesia of skin: Secondary | ICD-10-CM | POA: Diagnosis present

## 2016-08-21 DIAGNOSIS — Y95 Nosocomial condition: Secondary | ICD-10-CM | POA: Diagnosis present

## 2016-08-21 DIAGNOSIS — J209 Acute bronchitis, unspecified: Secondary | ICD-10-CM

## 2016-08-21 DIAGNOSIS — J189 Pneumonia, unspecified organism: Secondary | ICD-10-CM | POA: Diagnosis present

## 2016-08-21 DIAGNOSIS — J219 Acute bronchiolitis, unspecified: Secondary | ICD-10-CM | POA: Diagnosis present

## 2016-08-21 DIAGNOSIS — R197 Diarrhea, unspecified: Secondary | ICD-10-CM

## 2016-08-21 DIAGNOSIS — R262 Difficulty in walking, not elsewhere classified: Secondary | ICD-10-CM

## 2016-08-21 DIAGNOSIS — J111 Influenza due to unidentified influenza virus with other respiratory manifestations: Secondary | ICD-10-CM

## 2016-08-21 LAB — CBC WITH DIFFERENTIAL/PLATELET
Basophils Absolute: 0 10*3/uL (ref 0.0–0.1)
Basophils Relative: 0 %
EOS ABS: 0 10*3/uL (ref 0.0–0.7)
EOS PCT: 0 %
HCT: 37.4 % — ABNORMAL LOW (ref 39.0–52.0)
HEMOGLOBIN: 12.1 g/dL — AB (ref 13.0–17.0)
LYMPHS PCT: 11 %
Lymphs Abs: 1.3 10*3/uL (ref 0.7–4.0)
MCH: 24 pg — ABNORMAL LOW (ref 26.0–34.0)
MCHC: 32.4 g/dL (ref 30.0–36.0)
MCV: 74.1 fL — AB (ref 78.0–100.0)
MONO ABS: 0.9 10*3/uL (ref 0.1–1.0)
Monocytes Relative: 8 %
NEUTROS PCT: 81 %
Neutro Abs: 9.5 10*3/uL — ABNORMAL HIGH (ref 1.7–7.7)
PLATELETS: 307 10*3/uL (ref 150–400)
RBC: 5.05 MIL/uL (ref 4.22–5.81)
RDW: 21.9 % — ABNORMAL HIGH (ref 11.5–15.5)
WBC: 11.7 10*3/uL — AB (ref 4.0–10.5)

## 2016-08-21 LAB — BASIC METABOLIC PANEL
ANION GAP: 11 (ref 5–15)
BUN: 6 mg/dL (ref 6–20)
CO2: 25 mmol/L (ref 22–32)
Calcium: 8.8 mg/dL — ABNORMAL LOW (ref 8.9–10.3)
Chloride: 96 mmol/L — ABNORMAL LOW (ref 101–111)
Creatinine, Ser: 0.58 mg/dL — ABNORMAL LOW (ref 0.61–1.24)
GFR calc Af Amer: >60 mL/min (ref >60)
Glucose, Bld: 116 mg/dL — ABNORMAL HIGH (ref 65–99)
POTASSIUM: 5 mmol/L (ref 3.5–5.1)
SODIUM: 132 mmol/L — AB (ref 135–145)

## 2016-08-21 LAB — HEPATIC FUNCTION PANEL
ALT: 56 U/L (ref 17–63)
AST: 36 U/L (ref 15–41)
Albumin: 2.9 g/dL — ABNORMAL LOW (ref 3.5–5.0)
Alkaline Phosphatase: 95 U/L (ref 38–126)
Bilirubin, Direct: 0.2 mg/dL (ref 0.1–0.5)
Indirect Bilirubin: 0.4 mg/dL (ref 0.3–0.9)
TOTAL PROTEIN: 6.9 g/dL (ref 6.5–8.1)
Total Bilirubin: 0.6 mg/dL (ref 0.3–1.2)

## 2016-08-21 LAB — INFLUENZA PANEL BY PCR (TYPE A & B)
INFLBPCR: NEGATIVE
Influenza A By PCR: NEGATIVE

## 2016-08-21 LAB — LIPASE, BLOOD: Lipase: 13 U/L (ref 11–51)

## 2016-08-21 LAB — ETHANOL

## 2016-08-21 MED ORDER — PREDNISONE 20 MG PO TABS: 50.0000 mg | ORAL_TABLET | Freq: Every day | ORAL | Status: DC

## 2016-08-21 MED ORDER — AZITHROMYCIN 500 MG IV SOLR
500.0000 mg | Freq: Once | INTRAVENOUS | Status: AC
  Administered 2016-08-21: 500 mg via INTRAVENOUS
  Filled 2016-08-21: qty 500

## 2016-08-21 MED ORDER — NICOTINE 21 MG/24HR TD PT24
21.0000 mg | MEDICATED_PATCH | Freq: Every day | TRANSDERMAL | Status: DC
  Administered 2016-08-21 – 2016-08-27 (×7): 21 mg via TRANSDERMAL
  Filled 2016-08-21 (×7): qty 1

## 2016-08-21 MED ORDER — TRAZODONE HCL 50 MG PO TABS
100.0000 mg | ORAL_TABLET | Freq: Every evening | ORAL | Status: DC | PRN
  Administered 2016-08-22 – 2016-08-26 (×3): 100 mg via ORAL
  Filled 2016-08-21 (×2): qty 2

## 2016-08-21 MED ORDER — IOPAMIDOL (ISOVUE-370) INJECTION 76%
INTRAVENOUS | Status: AC
  Administered 2016-08-21: 100 mL
  Filled 2016-08-21: qty 100

## 2016-08-21 MED ORDER — PREDNISONE 20 MG PO TABS
60.0000 mg | ORAL_TABLET | Freq: Once | ORAL | Status: AC
  Administered 2016-08-21: 60 mg via ORAL
  Filled 2016-08-21: qty 3

## 2016-08-21 MED ORDER — LORAZEPAM 1 MG PO TABS: 1.0000 mg | ORAL_TABLET | Freq: Four times a day (QID) | ORAL | Status: AC | PRN

## 2016-08-21 MED ORDER — IPRATROPIUM-ALBUTEROL 0.5-2.5 (3) MG/3ML IN SOLN
3.0000 mL | RESPIRATORY_TRACT | Status: AC
  Administered 2016-08-21: 3 mL via RESPIRATORY_TRACT
  Filled 2016-08-21: qty 3

## 2016-08-21 MED ORDER — ONDANSETRON HCL 4 MG/2ML IJ SOLN
4.0000 mg | Freq: Four times a day (QID) | INTRAMUSCULAR | Status: DC | PRN
  Administered 2016-08-21: 4 mg via INTRAVENOUS
  Filled 2016-08-21: qty 2

## 2016-08-21 MED ORDER — IBUPROFEN 200 MG PO TABS
400.0000 mg | ORAL_TABLET | Freq: Four times a day (QID) | ORAL | Status: DC | PRN
  Administered 2016-08-23 – 2016-08-26 (×4): 400 mg via ORAL
  Filled 2016-08-21 (×4): qty 2

## 2016-08-21 MED ORDER — VITAMIN B-1 100 MG PO TABS
100.0000 mg | ORAL_TABLET | Freq: Every day | ORAL | Status: DC
  Administered 2016-08-21 – 2016-08-27 (×7): 100 mg via ORAL
  Filled 2016-08-21 (×7): qty 1

## 2016-08-21 MED ORDER — ALBUTEROL SULFATE (2.5 MG/3ML) 0.083% IN NEBU
2.5000 mg | INHALATION_SOLUTION | Freq: Once | RESPIRATORY_TRACT | Status: AC
  Administered 2016-08-21: 2.5 mg via RESPIRATORY_TRACT
  Filled 2016-08-21: qty 3

## 2016-08-21 MED ORDER — CHLORDIAZEPOXIDE HCL 25 MG PO CAPS
100.0000 mg | ORAL_CAPSULE | Freq: Once | ORAL | Status: AC
  Administered 2016-08-21: 100 mg via ORAL
  Filled 2016-08-21: qty 4

## 2016-08-21 MED ORDER — BENZONATATE 100 MG PO CAPS
200.0000 mg | ORAL_CAPSULE | Freq: Three times a day (TID) | ORAL | Status: DC | PRN
  Administered 2016-08-21 – 2016-08-25 (×6): 200 mg via ORAL
  Filled 2016-08-21 (×6): qty 2

## 2016-08-21 MED ORDER — GABAPENTIN 100 MG PO CAPS
100.0000 mg | ORAL_CAPSULE | Freq: Two times a day (BID) | ORAL | Status: DC
  Administered 2016-08-21 – 2016-08-27 (×12): 100 mg via ORAL
  Filled 2016-08-21 (×12): qty 1

## 2016-08-21 MED ORDER — SODIUM CHLORIDE 0.9 % IV BOLUS (SEPSIS)
1000.0000 mL | Freq: Once | INTRAVENOUS | Status: AC
  Administered 2016-08-21: 1000 mL via INTRAVENOUS

## 2016-08-21 MED ORDER — METOPROLOL TARTRATE 50 MG PO TABS
50.0000 mg | ORAL_TABLET | Freq: Two times a day (BID) | ORAL | Status: DC
  Administered 2016-08-21 – 2016-08-27 (×12): 50 mg via ORAL
  Filled 2016-08-21 (×8): qty 1
  Filled 2016-08-21 (×2): qty 2
  Filled 2016-08-21 (×2): qty 1

## 2016-08-21 MED ORDER — BUPROPION HCL ER (SR) 100 MG PO TB12
200.0000 mg | ORAL_TABLET | Freq: Every day | ORAL | Status: DC
  Administered 2016-08-22 – 2016-08-26 (×6): 200 mg via ORAL
  Filled 2016-08-21 (×7): qty 2

## 2016-08-21 MED ORDER — ONDANSETRON HCL 4 MG PO TABS: 4.0000 mg | ORAL_TABLET | Freq: Four times a day (QID) | ORAL | Status: DC | PRN

## 2016-08-21 MED ORDER — IPRATROPIUM-ALBUTEROL 0.5-2.5 (3) MG/3ML IN SOLN: 3.0000 mL | Freq: Four times a day (QID) | RESPIRATORY_TRACT | Status: DC | PRN

## 2016-08-21 MED ORDER — LORAZEPAM 2 MG/ML IJ SOLN: 2.0000 mg | INTRAMUSCULAR | Status: DC | PRN

## 2016-08-21 MED ORDER — FOLIC ACID 1 MG PO TABS
1.0000 mg | ORAL_TABLET | Freq: Every day | ORAL | Status: DC
  Administered 2016-08-21 – 2016-08-27 (×7): 1 mg via ORAL
  Filled 2016-08-21 (×7): qty 1

## 2016-08-21 MED ORDER — LORAZEPAM 2 MG/ML IJ SOLN
1.0000 mg | Freq: Four times a day (QID) | INTRAMUSCULAR | Status: AC | PRN
  Administered 2016-08-21 – 2016-08-22 (×2): 1 mg via INTRAVENOUS
  Filled 2016-08-21 (×2): qty 1

## 2016-08-21 MED ORDER — ALBUTEROL SULFATE (2.5 MG/3ML) 0.083% IN NEBU: 3.0000 mL | INHALATION_SOLUTION | RESPIRATORY_TRACT | Status: DC | PRN

## 2016-08-21 MED ORDER — IPRATROPIUM-ALBUTEROL 0.5-2.5 (3) MG/3ML IN SOLN
3.0000 mL | Freq: Once | RESPIRATORY_TRACT | Status: AC
  Administered 2016-08-21: 3 mL via RESPIRATORY_TRACT
  Filled 2016-08-21: qty 3

## 2016-08-21 MED ORDER — SODIUM CHLORIDE 0.9 % IV SOLN
INTRAVENOUS | Status: DC
  Administered 2016-08-21 – 2016-08-24 (×4): via INTRAVENOUS

## 2016-08-21 MED ORDER — LORAZEPAM 2 MG/ML IJ SOLN
1.0000 mg | Freq: Once | INTRAMUSCULAR | Status: AC
  Administered 2016-08-21: 1 mg via INTRAVENOUS
  Filled 2016-08-21: qty 1

## 2016-08-21 MED ORDER — PANTOPRAZOLE SODIUM 40 MG PO TBEC
40.0000 mg | DELAYED_RELEASE_TABLET | Freq: Every day | ORAL | Status: DC
  Administered 2016-08-22 – 2016-08-27 (×6): 40 mg via ORAL
  Filled 2016-08-21 (×7): qty 1

## 2016-08-21 MED ORDER — IPRATROPIUM-ALBUTEROL 18-103 MCG/ACT IN AERO: 2.0000 | INHALATION_SPRAY | Freq: Four times a day (QID) | RESPIRATORY_TRACT | Status: DC | PRN

## 2016-08-21 MED ORDER — ENOXAPARIN SODIUM 40 MG/0.4ML ~~LOC~~ SOLN
40.0000 mg | SUBCUTANEOUS | Status: DC
  Administered 2016-08-22 – 2016-08-27 (×6): 40 mg via SUBCUTANEOUS
  Filled 2016-08-21 (×6): qty 0.4

## 2016-08-21 MED ORDER — AZITHROMYCIN 250 MG PO TABS: 250.0000 mg | ORAL_TABLET | Freq: Every day | ORAL | Status: DC

## 2016-08-21 MED ORDER — ADULT MULTIVITAMIN W/MINERALS CH
1.0000 | ORAL_TABLET | Freq: Every day | ORAL | Status: DC
  Administered 2016-08-21 – 2016-08-27 (×7): 1 via ORAL
  Filled 2016-08-21 (×7): qty 1

## 2016-08-21 NOTE — ED Notes (Signed)
Patient transported to CT 

## 2016-08-21 NOTE — H&P (Signed)
History and Physical    Samuel Reyes [id number] DOB: 08-Dec-1955 DOA: 08/21/2016   PCP: Maren Reamer, MD Chief Complaint:  Chief Complaint  Patient presents with  . Cough  . Chest Pain    HPI: Samuel Reyes is a 61 y.o. male with medical history significant of EtOH abuse, COPD.  Patient presents to the ED with c/o cough, congestion, SOB, and CP.  He has been having N/V for the past week or so, NBNB.  Last drink was 1 week ago.  Having worsening respiratory symptoms over the past week as well.  L sided chest pain with cough.  Sharp, no radiation, 9/10 at worst.  ED Course: Has bronchiolitis on CT chest, and appears to have enteritis as well on CT abd/pelvis.  Review of Systems: As per HPI otherwise 10 point review of systems negative.    Past Medical History:  Diagnosis Date  . COPD (chronic obstructive pulmonary disease) ([place])   . Depression   . GERD (gastroesophageal reflux disease)   . Headache   . Hypertension   . Stroke [facility])     Past Surgical History:  Procedure Laterality Date  . BACK SURGERY     3 cervical spine surgeries C4-C5 fused  . COLONOSCOPY N/A 01/04/2014   Procedure: COLONOSCOPY;  Surgeon: Danie Binder, MD;  Location: [facility];  Service: Endoscopy;  Laterality: N/A;  1:45  . ESOPHAGOGASTRODUODENOSCOPY N/A 01/04/2014   Procedure: ESOPHAGOGASTRODUODENOSCOPY (EGD);  Surgeon: Danie Binder, MD;  Location: [facility];  Service: Endoscopy;  Laterality: N/A;  . FINGER SURGERY Left    2nd, 3rd, & 4th fingers were cut off by table saw and reattached  . GASTRECTOMY    . HERNIA REPAIR    . INCISIONAL HERNIA REPAIR N/A 01/20/2014   Procedure: LAPAROSCOPIC RECURRENT  INCISIONAL HERNIA with mesh;  Surgeon: Edward Jolly, MD;  Location: [facility] ORS;  Service: General;  Laterality: N/A;  . rt knee arthroscopic surgery    . SHOULDER SURGERY Bilateral    3 surgeries on on left, 2 surgeries on right      reports that he has been smoking Cigarettes.  He  started smoking about 50 years ago. He has a 45.00 pack-year smoking history. He has never used smokeless tobacco. He reports that he drinks alcohol. He reports that he does not use drugs.  Allergies  Allergen Reactions  . Bee Venom Anaphylaxis  . Penicillins Rash    Has patient had a PCN reaction causing immediate rash, facial/tongue/throat swelling, SOB or lightheadedness with hypotension: {Yes Has patient had a PCN reaction causing severe rash involving mucus membranes or skin necrosis: NO Has patient had a PCN reaction that required hospitalization {Yes Has patient had a PCN reaction occurring within the last 10 years: NO If all of the above answers are "NO", then may proceed with Cephalosporin use.     Family History  Problem Relation Age of Onset  . Cancer Father     bone  . Cancer Brother     lungs  . Stroke Maternal Grandmother   . Asthma Son     died at age 61 in his sleep   . Spina bifida Son     died at age 13   . Dementia Mother   . Colon cancer Neg Hx       Prior to Admission medications   Medication Sig Start Date End Date Taking? Authorizing Provider  albuterol (PROVENTIL HFA;VENTOLIN HFA) 108 (90 Base) MCG/ACT inhaler Inhale  1-2 puffs into the lungs every 4 (four) hours as needed for wheezing. 11/07/15  Yes Encarnacion Slates, NP  albuterol-ipratropium (COMBIVENT) 18-103 MCG/ACT inhaler Inhale 2 puffs into the lungs every 6 (six) hours as needed for wheezing or shortness of breath. 01/10/16  Yes Maren Reamer, MD  buPROPion (WELLBUTRIN SR) 200 MG 12 hr tablet Take 1 tablet (200 mg total) by mouth at bedtime. 12/13/15  Yes Niel Hummer, NP  folic acid (FOLVITE) 1 MG tablet Take 1 tablet (1 mg total) by mouth daily. 12/13/15  Yes Niel Hummer, NP  gabapentin (NEURONTIN) 100 MG capsule Take 1 capsule (100 mg total) by mouth 3 (three) times daily. For agitation/alcohol withdrawal syndrome Patient taking differently: Take 100 mg by mouth 2 (two) times daily. For  agitation/alcohol withdrawal syndrome 12/13/15  Yes Niel Hummer, NP  metoprolol (LOPRESSOR) 50 MG tablet Take 1 tablet (50 mg total) by mouth 2 (two) times daily. 08/06/16  Yes Donne Hazel, MD  nicotine (NICODERM CQ - DOSED IN MG/24 HOURS) 21 mg/24hr patch Place 1 patch (21 mg total) onto the skin daily. For nicotine addiction 11/07/15  Yes Encarnacion Slates, NP  pantoprazole (PROTONIX) 40 MG tablet Take 1 tablet (40 mg total) by mouth daily. 08/07/16  Yes Donne Hazel, MD  predniSONE (DELTASONE) 5 MG tablet Take 1 tablet (5 mg total) by mouth daily with breakfast. 08/06/16  Yes Donne Hazel, MD  thiamine 100 MG tablet Take 1 tablet (100 mg total) by mouth daily. 12/13/15  Yes Niel Hummer, NP  traZODone (DESYREL) 100 MG tablet Take 1 tablet (100 mg total) by mouth at bedtime as needed for sleep. Patient taking differently: Take 100-200 mg by mouth at bedtime as needed for sleep.  12/13/15  Yes Niel Hummer, NP    Physical Exam: Vitals:   08/21/16 1915 08/21/16 1930 08/21/16 2000 08/21/16 2030  BP: 149/89 134/81 142/86 154/94  Pulse: 106 94 95 95  Resp: 26 23 25 23   Temp:      TempSrc:      SpO2:   92% 99%  Weight:      Height:          Constitutional: NAD, calm, comfortable Eyes: PERRL, lids and conjunctivae normal ENMT: Mucous membranes are moist. Posterior pharynx clear of any exudate or lesions.Normal dentition.  Neck: normal, supple, no masses, no thyromegaly Respiratory: B wheezes. Cardiovascular: Tachycardia no murmurs / rubs / gallops. No extremity edema. 2+ pedal pulses. No carotid bruits.  Abdomen: Moderate diffuse tenderness, no masses palpated. No hepatosplenomegaly. Bowel sounds positive.  Musculoskeletal: no clubbing / cyanosis. No joint deformity upper and lower extremities. Good ROM, no contractures. Normal muscle tone.  Skin: no rashes, lesions, ulcers. No induration Neurologic: CN 2-12 grossly intact. Sensation intact, DTR normal. Strength 5/5 in all 4.  Psychiatric:  Normal judgment and insight. Alert and oriented x 3. Normal mood.    Labs on Admission: I have personally reviewed following labs and imaging studies  CBC:  Recent Labs Lab 08/21/16 1140  WBC 11.7*  NEUTROABS 9.5*  HGB 12.1*  HCT 37.4*  MCV 74.1*  PLT [id number]   Basic Metabolic Panel:  Recent Labs Lab 08/21/16 1140  NA 132*  K 5.0  CL 96*  CO2 25  GLUCOSE 116*  BUN 6  CREATININE 0.58*  CALCIUM 8.8*   GFR: Estimated Creatinine Clearance: 81.2 mL/min (by C-G formula based on SCr of 0.58 mg/dL (L)). Liver Function Tests:  Recent Labs Lab 08/21/16 1303  AST 36  ALT 56  ALKPHOS 95  BILITOT 0.6  PROT 6.9  ALBUMIN 2.9*    Recent Labs Lab 08/21/16 1303  LIPASE 13   No results for input(s): AMMONIA in the last 168 hours. Coagulation Profile: No results for input(s): INR, PROTIME in the last 168 hours. Cardiac Enzymes: No results for input(s): CKTOTAL, CKMB, CKMBINDEX, TROPONINI in the last 168 hours. BNP (last 3 results) No results for input(s): PROBNP in the last 8760 hours. HbA1C: No results for input(s): HGBA1C in the last 72 hours. CBG: No results for input(s): GLUCAP in the last 168 hours. Lipid Profile: No results for input(s): CHOL, HDL, LDLCALC, TRIG, CHOLHDL, LDLDIRECT in the last 72 hours. Thyroid Function Tests: No results for input(s): TSH, T4TOTAL, FREET4, T3FREE, THYROIDAB in the last 72 hours. Anemia Panel: No results for input(s): VITAMINB12, FOLATE, FERRITIN, TIBC, IRON, RETICCTPCT in the last 72 hours. Urine analysis:    Component Value Date/Time   COLORURINE YELLOW 08/02/2016 1900   APPEARANCEUR CLEAR 08/02/2016 1900   LABSPEC 1.021 08/02/2016 1900   PHURINE 5.0 08/02/2016 1900   GLUCOSEU >=500 (A) 08/02/2016 1900   HGBUR NEGATIVE 08/02/2016 1900   BILIRUBINUR NEGATIVE 08/02/2016 1900   KETONESUR NEGATIVE 08/02/2016 1900   PROTEINUR NEGATIVE 08/02/2016 1900   UROBILINOGEN 0.2 05/31/2015 2156   NITRITE NEGATIVE 08/02/2016 1900    LEUKOCYTESUR NEGATIVE 08/02/2016 1900   Sepsis Labs: @LABRCNTIP (procalcitonin:4,lacticidven:4) )No results found for this or any previous visit (from the past 240 hour(s)).   Radiological Exams on Admission: Dg Chest 2 View  Result Date: 08/21/2016 CLINICAL DATA:  Patient with cough, shortness of breath and fever. EXAM: CHEST  2 VIEW COMPARISON:  Chest radiograph 08/02/2016. FINDINGS: Stable cardiac and mediastinal contours. No consolidative pulmonary opacities. No pleural effusion or pneumothorax. Upper abdominal surgical clips. Thoracic spine degenerative changes. IMPRESSION: No active cardiopulmonary disease. Electronically Signed   By: Lovey Newcomer M.D.   On: 08/21/2016 12:28   Ct Angio Chest Pe W And/or Wo Contrast  Result Date: 08/21/2016 CLINICAL DATA:  Hypoxia.  Diffuse abdominal pain. EXAM: CT ANGIOGRAPHY CHEST CT ABDOMEN AND PELVIS WITH CONTRAST TECHNIQUE: Multidetector CT imaging of the chest was performed using the standard protocol during bolus administration of intravenous contrast. Multiplanar CT image reconstructions and MIPs were obtained to evaluate the vascular anatomy. Multidetector CT imaging of the abdomen and pelvis was performed using the standard protocol during bolus administration of intravenous contrast. CONTRAST:  100 cc Isovue 370 IV COMPARISON:  CT abdomen and pelvis 02/05/2015, CXR 08/02/2016 FINDINGS: CTA CHEST FINDINGS Cardiovascular: The study is of quality for the evaluation of pulmonary embolism. There are no filling defects in the central, lobar, segmental or subsegmental pulmonary artery branches to suggest acute pulmonary embolism. Great vessels are normal in course and caliber. Normal heart size. No significant pericardial fluid/thickening. Coronary arteriosclerosis along the left main, lad and circumflex. Mediastinum/Nodes: No discrete thyroid nodules. Unremarkable esophagus. Surgical clips at the GE junction again noted. No pathologically enlarged axillary nor  mediastinal nodes. Right hilar lymphadenopathy 1.5 cm short axis may be reactive in etiology. Lungs/Pleura: Bilateral tree-in-bud densities throughout both lungs consistent with bronchiolitis with minimal peribronchial thickening and bronchiectasis especially in the upper lobes. Small nodular densities are seen in the right upper and lower lobes possibly representing pulmonary nodules versus inspissated mucus or areas of postinfectious or postinflammatory change, index nodules measuring 3 mm on images 43 in the right upper and two on image 76 in the  right lower lobe measuring 3-4 mm each on the lung window series. No pleural effusion. Tiny pulmonary consolidation with air bronchogram in the right lower lobe possibly postinfectious or inflammatory, image 77 and abutting the posterior right lower lobe, image 33 are noted the this may represent a focus of atelectasis given its somewhat triangular appearance. Upper abdomen: See below Musculoskeletal:  No aggressive appearing focal osseous lesions. Review of the MIP images confirms the above findings. CT ABDOMEN and PELVIS FINDINGS Hepatobiliary: Hepatic steatosis. Unremarkable gallbladder. No biliary dilatation. Focal fat along the falciform ligament. Mild micronodular moderate appearance of the left hepatic lobe again noted, image 21, series 601. Pancreas: Unremarkable. No pancreatic ductal dilatation or surrounding inflammatory changes. Spleen: Normal in size without focal abnormality. Adrenals/Urinary Tract: Normal bilateral adrenal glands. Kidneys enhance appropriately with tiny lower pole 3 mm right and interpolar left hypodensities statistically consistent with cysts but too small to further characterize. Physiologic distention of the bladder. No obstructive uropathy or nephrolithiasis. Stomach/Bowel: Surgical clips are noted at the GE junction in the adjacent to the posterior cortex of the spleen. There is no bowel obstruction. A few minimally distended small  bowel loops in the left hemiabdomen extending into the left hemipelvis containing a mild amount of fluid may represent an enteritis. Normal-appearing appendix. There is no large bowel obstruction. Segmental areas of transmural thickening is seen involving the distal descending and sigmoid colon. This has the appearance of colonic spasm and underdistention. Colitis would be difficult to entirely exclude. Correlate. Vascular/Lymphatic: Aortic atherosclerosis. No enlarged abdominal or pelvic lymph nodes. Stable small perigastric varices. Reproductive: Central zone calcifications within the normal size prostate. Other: No abdominal wall hernia or abnormality. No abdominopelvic ascites. Musculoskeletal: Status post right inguinal hernia repair. Degenerative disc disease L1-2 and L5-S1. Review of the MIP images confirms the above findings. IMPRESSION: 1. No acute pulmonary embolus. 2. Diffuse tree-in-bud appearance of the lungs consistent with bronchiolitis. 3. Small nodular opacities measuring between 3 and 4 mm in the right upper and lower lobes cannot entirely exclude pulmonary nodules. No follow-up needed if patient is low-risk (and has no known or suspected primary neoplasm). Non-contrast chest CT can be considered in 12 months if patient is high-risk. This recommendation follows the consensus statement: Guidelines for Management of Incidental Pulmonary Nodules Detected on CT Images: From the Fleischner Society 2017; Radiology 2017; 284:228-243. 4. Tiny pulmonary consolidations in the right upper and lower lobes which may represent areas of atelectasis or possible areas of infection/inflammation. 5. Hepatic steatosis with stable suspected mild changes of cirrhosis. 6. Probable distal descending and sigmoid colonic spasm accounting for segmental areas of luminal narrowing and mild thickening. A mild colitis is not entirely excluded. Similarly there are a few fluid-filled mildly distended small bowel loops in the left  hemiabdomen and pelvis which may reflect small bowel enteritis. Electronically Signed   By: Ashley Royalty M.D.   On: 08/21/2016 19:02   Ct Abdomen Pelvis W Contrast  Result Date: 08/21/2016 CLINICAL DATA:  Hypoxia.  Diffuse abdominal pain. EXAM: CT ANGIOGRAPHY CHEST CT ABDOMEN AND PELVIS WITH CONTRAST TECHNIQUE: Multidetector CT imaging of the chest was performed using the standard protocol during bolus administration of intravenous contrast. Multiplanar CT image reconstructions and MIPs were obtained to evaluate the vascular anatomy. Multidetector CT imaging of the abdomen and pelvis was performed using the standard protocol during bolus administration of intravenous contrast. CONTRAST:  100 cc Isovue 370 IV COMPARISON:  CT abdomen and pelvis 02/05/2015, CXR 08/02/2016 FINDINGS: CTA CHEST FINDINGS  Cardiovascular: The study is of quality for the evaluation of pulmonary embolism. There are no filling defects in the central, lobar, segmental or subsegmental pulmonary artery branches to suggest acute pulmonary embolism. Great vessels are normal in course and caliber. Normal heart size. No significant pericardial fluid/thickening. Coronary arteriosclerosis along the left main, lad and circumflex. Mediastinum/Nodes: No discrete thyroid nodules. Unremarkable esophagus. Surgical clips at the GE junction again noted. No pathologically enlarged axillary nor mediastinal nodes. Right hilar lymphadenopathy 1.5 cm short axis may be reactive in etiology. Lungs/Pleura: Bilateral tree-in-bud densities throughout both lungs consistent with bronchiolitis with minimal peribronchial thickening and bronchiectasis especially in the upper lobes. Small nodular densities are seen in the right upper and lower lobes possibly representing pulmonary nodules versus inspissated mucus or areas of postinfectious or postinflammatory change, index nodules measuring 3 mm on images 43 in the right upper and two on image 76 in the right lower lobe  measuring 3-4 mm each on the lung window series. No pleural effusion. Tiny pulmonary consolidation with air bronchogram in the right lower lobe possibly postinfectious or inflammatory, image 77 and abutting the posterior right lower lobe, image 33 are noted the this may represent a focus of atelectasis given its somewhat triangular appearance. Upper abdomen: See below Musculoskeletal:  No aggressive appearing focal osseous lesions. Review of the MIP images confirms the above findings. CT ABDOMEN and PELVIS FINDINGS Hepatobiliary: Hepatic steatosis. Unremarkable gallbladder. No biliary dilatation. Focal fat along the falciform ligament. Mild micronodular moderate appearance of the left hepatic lobe again noted, image 21, series 601. Pancreas: Unremarkable. No pancreatic ductal dilatation or surrounding inflammatory changes. Spleen: Normal in size without focal abnormality. Adrenals/Urinary Tract: Normal bilateral adrenal glands. Kidneys enhance appropriately with tiny lower pole 3 mm right and interpolar left hypodensities statistically consistent with cysts but too small to further characterize. Physiologic distention of the bladder. No obstructive uropathy or nephrolithiasis. Stomach/Bowel: Surgical clips are noted at the GE junction in the adjacent to the posterior cortex of the spleen. There is no bowel obstruction. A few minimally distended small bowel loops in the left hemiabdomen extending into the left hemipelvis containing a mild amount of fluid may represent an enteritis. Normal-appearing appendix. There is no large bowel obstruction. Segmental areas of transmural thickening is seen involving the distal descending and sigmoid colon. This has the appearance of colonic spasm and underdistention. Colitis would be difficult to entirely exclude. Correlate. Vascular/Lymphatic: Aortic atherosclerosis. No enlarged abdominal or pelvic lymph nodes. Stable small perigastric varices. Reproductive: Central zone  calcifications within the normal size prostate. Other: No abdominal wall hernia or abnormality. No abdominopelvic ascites. Musculoskeletal: Status post right inguinal hernia repair. Degenerative disc disease L1-2 and L5-S1. Review of the MIP images confirms the above findings. IMPRESSION: 1. No acute pulmonary embolus. 2. Diffuse tree-in-bud appearance of the lungs consistent with bronchiolitis. 3. Small nodular opacities measuring between 3 and 4 mm in the right upper and lower lobes cannot entirely exclude pulmonary nodules. No follow-up needed if patient is low-risk (and has no known or suspected primary neoplasm). Non-contrast chest CT can be considered in 12 months if patient is high-risk. This recommendation follows the consensus statement: Guidelines for Management of Incidental Pulmonary Nodules Detected on CT Images: From the Fleischner Society 2017; Radiology 2017; 284:228-243. 4. Tiny pulmonary consolidations in the right upper and lower lobes which may represent areas of atelectasis or possible areas of infection/inflammation. 5. Hepatic steatosis with stable suspected mild changes of cirrhosis. 6. Probable distal descending and sigmoid colonic  spasm accounting for segmental areas of luminal narrowing and mild thickening. A mild colitis is not entirely excluded. Similarly there are a few fluid-filled mildly distended small bowel loops in the left hemiabdomen and pelvis which may reflect small bowel enteritis. Electronically Signed   By: Ashley Royalty M.D.   On: 08/21/2016 19:02    EKG: Independently reviewed.  Assessment/Plan Principal Problem:   COPD exacerbation (HCC) Active Problems:   Alcoholism /alcohol abuse (HCC)   Nausea vomiting and diarrhea   Bronchiolitis    1. COPD exacerbation - 1. COPD pathway 2. Adult wheeze protocol 3. Prednisone 50mg  PO daily 4. O2 via [place] 5. Empiric azithromycin 2. EtOH abuse -  1. Tele monitor 2. CIWA 3. N/V - PRN zofran   DVT prophylaxis:  Lovenox Code Status: Full Family Communication: No family in room Consults called: None Admission status: Admit to inpatient   Etta Quill DO [facility] Pager [phone] from 7PM-7AM  If 7AM-7PM, please contact the day physician for the patient [email or web address] Password [facility]  08/21/2016, 8:53 PM

## 2016-08-21 NOTE — ED Notes (Signed)
IV team at bedside 

## 2016-08-21 NOTE — ED Notes (Signed)
Patient returned to room from CT and placed back on monitor.

## 2016-08-21 NOTE — ED Provider Notes (Signed)
[facility] DEPT Provider Note   CSN: [id number] Arrival date & time: 08/21/16  1101     History   Chief Complaint Chief Complaint  Patient presents with  . Cough  . Chest Pain    HPI Samuel Reyes is a 61 y.o. male.  61 yo M cc of cough and chest pain.  Preceded by nausea and vomiting.  Having about 12 episodes a day, non bloody non bilious.  Cough going on for past week or so.  Left lateral sharp pain worse with coughing. 9/10.  Denies radiation.  Also having some subjective sob that is worsening.  Having some night sweats, chills. Mild lightheadedness and dizziness upon standing. Last drank about a week ago, known alcoholic.  Denies other illegal drug use.    The history is provided by the patient.  Cough  Associated symptoms include chest pain and shortness of breath. Pertinent negatives include no chills, no headaches and no myalgias.  Chest Pain   Associated symptoms include cough, nausea, shortness of breath and vomiting. Pertinent negatives include no abdominal pain, no fever, no headaches and no palpitations.  Illness  This is a new problem. The current episode started more than 1 week ago. The problem occurs constantly. The problem has been rapidly worsening. Associated symptoms include chest pain and shortness of breath. Pertinent negatives include no abdominal pain and no headaches. Nothing aggravates the symptoms. Nothing relieves the symptoms. He has tried nothing for the symptoms. The treatment provided no relief.    Past Medical History:  Diagnosis Date  . COPD (chronic obstructive pulmonary disease) ([place])   . Depression   . GERD (gastroesophageal reflux disease)   . Headache   . Hypertension   . Stroke [facility])     Patient Active Problem List   Diagnosis Date Noted  . Malnutrition of moderate degree 08/04/2016  . Atrial tachycardia ([place]) 08/02/2016  . Impaired glucose tolerance 08/02/2016  . Alcohol-induced mood disorder ([place]) 12/12/2015  . Syncope  11/15/2015  . Chest pain 11/15/2015  . Nausea vomiting and diarrhea 11/15/2015  . Abdominal pain 11/15/2015  . Major depressive disorder, recurrent episode, moderate with anxious distress ([place]) 10/30/2015  . Alcohol use disorder, severe, dependence ([place]) 10/29/2015  . COPD exacerbation ([place]) 09/20/2015  . Acute respiratory failure with hypoxia ([place]) 09/18/2015  . Alcoholism /alcohol abuse ([place])   . Suicidal ideation 09/17/2015  . Alcohol intoxication ([place]) 09/17/2015  . Depression 09/17/2015  . Benign essential HTN 09/17/2015  . Hypokalemia 09/17/2015  . Hyponatremia 09/17/2015  . Coffee ground emesis 09/17/2015  . COPD (chronic obstructive pulmonary disease) ([place]) 02/17/2013  . GERD (gastroesophageal reflux disease) 02/17/2013    Past Surgical History:  Procedure Laterality Date  . BACK SURGERY     3 cervical spine surgeries C4-C5 fused  . COLONOSCOPY N/A 01/04/2014   Procedure: COLONOSCOPY;  Surgeon: Danie Binder, MD;  Location: [facility] ENDO SUITE;  Service: Endoscopy;  Laterality: N/A;  1:45  . ESOPHAGOGASTRODUODENOSCOPY N/A 01/04/2014   Procedure: ESOPHAGOGASTRODUODENOSCOPY (EGD);  Surgeon: Danie Binder, MD;  Location: [facility] END[facility] SUITE;  Service: Endoscopy;  Laterality: N/A;  . FINGER SURGERY Left    2nd, 3rd, & 4th fingers were cut off by table saw and reattached  . GASTRECTOMY    . HERNIA REPAIR    . INCISIONAL HERNIA REPAIR N/A 01/20/2014   Procedure: LAPAROSCOPIC RECURRENT  INCISIONAL HERNIA with mesh;  Surgeon: Edward Jolly, MD;  Location: [facility] ORS;  Service: General;  Laterality: N/A;  . rt  knee arthroscopic surgery    . SHOULDER SURGERY Bilateral    3 surgeries on on left, 2 surgeries on right        Home Medications    Prior to Admission medications   Medication Sig Start Date End Date Taking? Authorizing Provider  albuterol (PROVENTIL HFA;VENTOLIN HFA) 108 (90 Base) MCG/ACT inhaler Inhale 1-2 puffs into the lungs every 4 (four) hours as needed for wheezing.  11/07/15  Yes Encarnacion Slates, NP  albuterol-ipratropium (COMBIVENT) 18-103 MCG/ACT inhaler Inhale 2 puffs into the lungs every 6 (six) hours as needed for wheezing or shortness of breath. 01/10/16  Yes Maren Reamer, MD  buPROPion (WELLBUTRIN SR) 200 MG 12 hr tablet Take 1 tablet (200 mg total) by mouth at bedtime. 12/13/15  Yes Niel Hummer, NP  folic acid (FOLVITE) 1 MG tablet Take 1 tablet (1 mg total) by mouth daily. 12/13/15  Yes Niel Hummer, NP  gabapentin (NEURONTIN) 100 MG capsule Take 1 capsule (100 mg total) by mouth 3 (three) times daily. For agitation/alcohol withdrawal syndrome Patient taking differently: Take 100 mg by mouth 2 (two) times daily. For agitation/alcohol withdrawal syndrome 12/13/15  Yes Niel Hummer, NP  metoprolol (LOPRESSOR) 50 MG tablet Take 1 tablet (50 mg total) by mouth 2 (two) times daily. 08/06/16  Yes Donne Hazel, MD  nicotine (NICODERM CQ - DOSED IN MG/24 HOURS) 21 mg/24hr patch Place 1 patch (21 mg total) onto the skin daily. For nicotine addiction 11/07/15  Yes Encarnacion Slates, NP  pantoprazole (PROTONIX) 40 MG tablet Take 1 tablet (40 mg total) by mouth daily. 08/07/16  Yes Donne Hazel, MD  predniSONE (DELTASONE) 5 MG tablet Take 1 tablet (5 mg total) by mouth daily with breakfast. 08/06/16  Yes Donne Hazel, MD  thiamine 100 MG tablet Take 1 tablet (100 mg total) by mouth daily. 12/13/15  Yes Niel Hummer, NP  traZODone (DESYREL) 100 MG tablet Take 1 tablet (100 mg total) by mouth at bedtime as needed for sleep. Patient taking differently: Take 100-200 mg by mouth at bedtime as needed for sleep.  12/13/15  Yes Niel Hummer, NP    Family History Family History  Problem Relation Age of Onset  . Cancer Father     bone  . Cancer Brother     lungs  . Stroke Maternal Grandmother   . Asthma Son     died at age 55 in his sleep   . Spina bifida Son     died at age 62   . Dementia Mother   . Colon cancer Neg Hx     Social History Social History    Substance Use Topics  . Smoking status: Current Every Day Smoker    Packs/day: 1.00    Years: 45.00    Types: Cigarettes    Start date: 07/30/1966  . Smokeless tobacco: Never Used  . Alcohol use Yes     Comment: 'tries to drink every day' ETOH abuse; "last drink 4 days, ran out of money"     Allergies   Bee venom and Penicillins   Review of Systems Review of Systems  Constitutional: Negative for chills and fever.  HENT: Positive for congestion. Negative for facial swelling.   Eyes: Negative for discharge and visual disturbance.  Respiratory: Positive for cough and shortness of breath.   Cardiovascular: Positive for chest pain. Negative for palpitations.  Gastrointestinal: Positive for diarrhea, nausea and vomiting. Negative for abdominal pain.  Musculoskeletal: Negative for arthralgias and myalgias.  Skin: Negative for color change and rash.  Neurological: Negative for tremors, syncope and headaches.  Psychiatric/Behavioral: Negative for confusion and dysphoric mood.     Physical Exam Updated Vital Signs BP 138/89   Pulse 109   Temp 98.5 F (36.9 C) (Oral)   Resp 22   Ht 5\' 4"  (1.626 m)   Wt 155 lb (70.3 kg)   SpO2 100%   BMI 26.61 kg/m   Physical Exam  Constitutional: He is oriented to person, place, and time. He appears well-developed and well-nourished.  HENT:  Head: Normocephalic and atraumatic.  Dry mucous membranes  Eyes: EOM are normal. Pupils are equal, round, and reactive to light.  Neck: Normal range of motion. Neck supple. No JVD present.  Cardiovascular: Regular rhythm.  Tachycardia present.  Exam reveals no gallop and no friction rub.   No murmur heard. Pulmonary/Chest: No respiratory distress. He has wheezes (bilaterally).  Abdominal: He exhibits no distension and no mass. There is tenderness (diffuse, moderate). There is no rebound and no guarding.  Musculoskeletal: Normal range of motion.  Neurological: He is alert and oriented to person, place,  and time.  Skin: No rash noted. No pallor.  Psychiatric: He has a normal mood and affect. His behavior is normal.  Nursing note and vitals reviewed.    ED Treatments / Results  Labs (all labs ordered are listed, but only abnormal results are displayed) Labs Reviewed  CBC WITH DIFFERENTIAL/PLATELET - Abnormal; Notable for the following:       Result Value   WBC 11.7 (*)    Hemoglobin 12.1 (*)    HCT 37.4 (*)    MCV 74.1 (*)    MCH 24.0 (*)    RDW 21.9 (*)    Neutro Abs 9.5 (*)    All other components within normal limits  BASIC METABOLIC PANEL - Abnormal; Notable for the following:    Sodium 132 (*)    Chloride 96 (*)    Glucose, Bld 116 (*)    Creatinine, Ser 0.58 (*)    Calcium 8.8 (*)    All other components within normal limits  HEPATIC FUNCTION PANEL - Abnormal; Notable for the following:    Albumin 2.9 (*)    All other components within normal limits  CULTURE, BLOOD (ROUTINE X 2)  CULTURE, BLOOD (ROUTINE X 2)  LIPASE, BLOOD  ETHANOL  INFLUENZA PANEL BY PCR (TYPE A & B)    EKG  EKG Interpretation  Date/Time:  Tuesday August 21 2016 11:10:26 EST Ventricular Rate:  124 PR Interval:    QRS Duration: 93 QT Interval:  325 QTC Calculation: 467 R Axis:   105 Text Interpretation:  Sinus or ectopic atrial tachycardia Ventricular premature complex Aberrant conduction of SV complex(es) Probable left atrial enlargement Right axis deviation RSR' in V1 or V2, probably normal variant background noise No significant change since last tracing Confirmed by Starr Urias MD, DANIEL [phone]) on 08/21/2016 11:42:27 AM       Radiology Dg Chest 2 View  Result Date: 08/21/2016 CLINICAL DATA:  Patient with cough, shortness of breath and fever. EXAM: CHEST  2 VIEW COMPARISON:  Chest radiograph 08/02/2016. FINDINGS: Stable cardiac and mediastinal contours. No consolidative pulmonary opacities. No pleural effusion or pneumothorax. Upper abdominal surgical clips. Thoracic spine degenerative  changes. IMPRESSION: No active cardiopulmonary disease. Electronically Signed   By: Lovey Newcomer M.D.   On: 08/21/2016 12:28    Procedures Procedures (including critical care time)  Medications Ordered in ED Medications  ipratropium-albuterol (DUONEB) 0.5-2.5 (3) MG/3ML nebulizer solution 3 mL (3 mLs Nebulization Given 08/21/16 1513)  LORazepam (ATIVAN) injection 2-3 mg (not administered)  folic acid (FOLVITE) tablet 1 mg (not administered)  thiamine (VITAMIN B-1) tablet 100 mg (not administered)  multivitamin with minerals tablet 1 tablet (not administered)  sodium chloride 0.9 % bolus 1,000 mL (0 mLs Intravenous Stopped 08/21/16 1253)  LORazepam (ATIVAN) injection 1 mg (1 mg Intravenous Given 08/21/16 1254)  chlordiazePOXIDE (LIBRIUM) capsule 100 mg (100 mg Oral Given 08/21/16 1257)  sodium chloride 0.9 % bolus 1,000 mL (0 mLs Intravenous Stopped 08/21/16 1518)  predniSONE (DELTASONE) tablet 60 mg (60 mg Oral Given 08/21/16 1511)  iopamidol (ISOVUE-370) 76 % injection (100 mLs  Contrast Given 08/21/16 1609)     Initial Impression / Assessment and Plan / ED Course  I have reviewed the triage vital signs and the nursing notes.  Pertinent labs & imaging results that were available during my care of the patient were reviewed by me and considered in my medical decision making (see chart for details).     61 yo M With a chief complaint of a influenza-like illness. Going on for the past weeks. Patient is concerned because his roommate was diagnosed with the flu and has been in the hospital for the past 3 days. Patient's been having trouble keeping things down and having lots of vomiting and diarrhea. Initially he had a heart rate into the 130s this improved with Ativan and IV fluids. Patient having significant wheezes will give 3 DuoNeb's back-to-back steroids and reassess. Will attempt to ambulate the patient.  Unable to ambulate very far, hypoxic into the mid 80s.  Will CT to eval for PE or  intraabdominal pathology.   Turned over to Dr. Roderic Palau, please see his note for further care.   The patients results and plan were reviewed and discussed.   Any x-rays performed were independently reviewed by myself.   Differential diagnosis were considered with the presenting HPI.  Medications  ipratropium-albuterol (DUONEB) 0.5-2.5 (3) MG/3ML nebulizer solution 3 mL (3 mLs Nebulization Given 08/21/16 1513)  LORazepam (ATIVAN) injection 2-3 mg (not administered)  folic acid (FOLVITE) tablet 1 mg (not administered)  thiamine (VITAMIN B-1) tablet 100 mg (not administered)  multivitamin with minerals tablet 1 tablet (not administered)  sodium chloride 0.9 % bolus 1,000 mL (0 mLs Intravenous Stopped 08/21/16 1253)  LORazepam (ATIVAN) injection 1 mg (1 mg Intravenous Given 08/21/16 1254)  chlordiazePOXIDE (LIBRIUM) capsule 100 mg (100 mg Oral Given 08/21/16 1257)  sodium chloride 0.9 % bolus 1,000 mL (0 mLs Intravenous Stopped 08/21/16 1518)  predniSONE (DELTASONE) tablet 60 mg (60 mg Oral Given 08/21/16 1511)  iopamidol (ISOVUE-370) 76 % injection (100 mLs  Contrast Given 08/21/16 1609)    Vitals:   08/21/16 1445 08/21/16 1500 08/21/16 1545 08/21/16 1600  BP: 167/98 136/88 136/91 138/89  Pulse: 113 108 110 109  Resp: (!) 27 (!) 27 21 22   Temp:      TempSrc:      SpO2: 98% 99% 100% 100%  Weight:      Height:        Final diagnoses:  Influenza-like illness       Final Clinical Impressions(s) / ED Diagnoses   Final diagnoses:  Influenza-like illness    New Prescriptions New Prescriptions   No medications on file     Deno Etienne, DO 08/21/16 1658

## 2016-08-21 NOTE — ED Triage Notes (Signed)
Pt arrives from home via [facility] reporting SOB, cough with CP worse with respiration and reproducible x 10 days.  Pt  O2 sats 93% RA, RR 28.  Pt placed on 2L, O2 sats 96%.  Rhonchi audible.

## 2016-08-21 NOTE — ED Notes (Signed)
EKG given to Dr. Tyrone Nine

## 2016-08-21 NOTE — ED Notes (Signed)
Pt transported to CT, again.

## 2016-08-21 NOTE — ED Notes (Signed)
Patient requesting something for cough and headache. Spoke with Admitting MD, see new orders.

## 2016-08-21 NOTE — ED Notes (Addendum)
Ambulated pt in hallway, pt didn't tolerate ambulation well. Pulse went from 108 to 125, and SpO2 went from 93 to 85. Notified Dr. Tyrone Nine.

## 2016-08-21 NOTE — ED Notes (Signed)
Patient transported to X-ray 

## 2016-08-22 DIAGNOSIS — J9601 Acute respiratory failure with hypoxia: Secondary | ICD-10-CM

## 2016-08-22 DIAGNOSIS — K709 Alcoholic liver disease, unspecified: Secondary | ICD-10-CM

## 2016-08-22 DIAGNOSIS — J441 Chronic obstructive pulmonary disease with (acute) exacerbation: Secondary | ICD-10-CM

## 2016-08-22 DIAGNOSIS — J189 Pneumonia, unspecified organism: Secondary | ICD-10-CM | POA: Diagnosis present

## 2016-08-22 DIAGNOSIS — F1020 Alcohol dependence, uncomplicated: Secondary | ICD-10-CM

## 2016-08-22 LAB — CBC
HCT: 32.9 % — ABNORMAL LOW (ref 39.0–52.0)
HEMOGLOBIN: 10.6 g/dL — AB (ref 13.0–17.0)
MCH: 24.2 pg — AB (ref 26.0–34.0)
MCHC: 32.2 g/dL (ref 30.0–36.0)
MCV: 75.1 fL — AB (ref 78.0–100.0)
PLATELETS: 325 10*3/uL (ref 150–400)
RBC: 4.38 MIL/uL (ref 4.22–5.81)
RDW: 22 % — ABNORMAL HIGH (ref 11.5–15.5)
WBC: 9.4 10*3/uL (ref 4.0–10.5)

## 2016-08-22 LAB — BASIC METABOLIC PANEL
ANION GAP: 11 (ref 5–15)
BUN: 5 mg/dL — ABNORMAL LOW (ref 6–20)
CHLORIDE: 102 mmol/L (ref 101–111)
CO2: 23 mmol/L (ref 22–32)
CREATININE: 0.51 mg/dL — AB (ref 0.61–1.24)
Calcium: 8.1 mg/dL — ABNORMAL LOW (ref 8.9–10.3)
GFR calc non Af Amer: >60 mL/min (ref >60)
Glucose, Bld: 137 mg/dL — ABNORMAL HIGH (ref 65–99)
POTASSIUM: 4.2 mmol/L (ref 3.5–5.1)
SODIUM: 136 mmol/L (ref 135–145)

## 2016-08-22 LAB — HIV ANTIBODY (ROUTINE TESTING W REFLEX): HIV Screen 4th Generation wRfx: NONREACTIVE

## 2016-08-22 LAB — STREP PNEUMONIAE URINARY ANTIGEN: Strep Pneumo Urinary Antigen: NEGATIVE

## 2016-08-22 MED ORDER — ALUM & MAG HYDROXIDE-SIMETH 200-200-20 MG/5ML PO SUSP
30.0000 mL | ORAL | Status: DC | PRN
  Administered 2016-08-22 – 2016-08-26 (×6): 30 mL via ORAL
  Filled 2016-08-22 (×6): qty 30

## 2016-08-22 MED ORDER — METHYLPREDNISOLONE SODIUM SUCC 125 MG IJ SOLR
60.0000 mg | Freq: Four times a day (QID) | INTRAMUSCULAR | Status: DC
  Administered 2016-08-22 – 2016-08-25 (×13): 60 mg via INTRAVENOUS
  Filled 2016-08-22 (×13): qty 2

## 2016-08-22 MED ORDER — LEVALBUTEROL HCL 0.63 MG/3ML IN NEBU
0.6300 mg | INHALATION_SOLUTION | RESPIRATORY_TRACT | Status: DC
  Administered 2016-08-22 – 2016-08-24 (×11): 0.63 mg via RESPIRATORY_TRACT
  Filled 2016-08-22 (×13): qty 3

## 2016-08-22 MED ORDER — LEVOFLOXACIN IN D5W 750 MG/150ML IV SOLN
750.0000 mg | INTRAVENOUS | Status: DC
  Administered 2016-08-22 – 2016-08-24 (×3): 750 mg via INTRAVENOUS
  Filled 2016-08-22 (×3): qty 150

## 2016-08-22 MED ORDER — DEXTROSE 5 % IV SOLN
2.0000 g | Freq: Three times a day (TID) | INTRAVENOUS | Status: DC
  Administered 2016-08-22 – 2016-08-24 (×6): 2 g via INTRAVENOUS
  Filled 2016-08-22 (×8): qty 2

## 2016-08-22 MED ORDER — LEVALBUTEROL HCL 0.63 MG/3ML IN NEBU: 0.6300 mg | INHALATION_SOLUTION | RESPIRATORY_TRACT | Status: DC | PRN

## 2016-08-22 MED ORDER — IPRATROPIUM BROMIDE 0.02 % IN SOLN
0.5000 mg | RESPIRATORY_TRACT | Status: DC
  Administered 2016-08-22 – 2016-08-24 (×11): 0.5 mg via RESPIRATORY_TRACT
  Filled 2016-08-22 (×13): qty 2.5

## 2016-08-22 MED ORDER — SODIUM CHLORIDE 0.9 % IV SOLN
1500.0000 mg | Freq: Once | INTRAVENOUS | Status: AC
  Administered 2016-08-22: 1500 mg via INTRAVENOUS
  Filled 2016-08-22: qty 1500

## 2016-08-22 MED ORDER — VANCOMYCIN HCL IN DEXTROSE 1-5 GM/200ML-% IV SOLN
1000.0000 mg | Freq: Three times a day (TID) | INTRAVENOUS | Status: DC
  Administered 2016-08-22 – 2016-08-24 (×6): 1000 mg via INTRAVENOUS
  Filled 2016-08-22 (×7): qty 200

## 2016-08-22 NOTE — ED Notes (Signed)
Pt eating breakfast at this time.  

## 2016-08-22 NOTE — Progress Notes (Signed)
Pharmacy Antibiotic Note  Riad Slemmer is a 61 y.o. male admitted on 08/21/2016 with HCAP.  Pharmacy has been consulted for levaquin and vancomycin dosing. Pt with reported PCN allergy of rash.  He got cefepime 08/2014 x 1 dose.  He says PCN makes his throat swell up when I interviewed him. On Aztreonam 2 gm q8h.  He was given azithromycin 500 mg IV on 1/23 at 2157. Wt 70.3 kg, WBC 11.7>8.4, ANC 9.5, creat 0.5, flu negative. AF.   Plan: levaquin 750 mg IV q24 - to start at 2200 tonight (scheduled 24 hrs after azithromycin to avoid qt prolongation) On aztreonam 2 gm q8h per MD orders Vancomycin 1500 mg loading dose Vancomycin 1000 mg IV every 8 hours.  Goal trough 15-20 mcg/mL.  F/u renal fxn, wbc, temp, culture data Vancomycin levels as needed  Height: 5\' 4"  (162.6 cm) Weight: 155 lb (70.3 kg) IBW/kg (Calculated) : 59.2  Temp (24hrs), Avg:98.5 F (36.9 C), Min:98.5 F (36.9 C), Max:98.5 F (36.9 C)   Recent Labs Lab 08/21/16 1140 08/22/16 0158  WBC 11.7* 9.4  CREATININE 0.58* 0.51*    Estimated Creatinine Clearance: 81.2 mL/min (by C-G formula based on SCr of 0.51 mg/dL (L)).    Allergies  Allergen Reactions  . Bee Venom Anaphylaxis  . Penicillins Rash    Has patient had a PCN reaction causing immediate rash, facial/tongue/throat swelling, SOB or lightheadedness with hypotension: {Yes Has patient had a PCN reaction causing severe rash involving mucus membranes or skin necrosis: NO Has patient had a PCN reaction that required hospitalization {Yes Has patient had a PCN reaction occurring within the last 10 years: NO If all of the above answers are "NO", then may proceed with Cephalosporin use.     Eudelia Bunch, Pharm.D. [id number] 08/22/2016 8:57 AM

## 2016-08-22 NOTE — ED Notes (Signed)
Regular Diet was ordered for Lunch. 

## 2016-08-22 NOTE — Progress Notes (Addendum)
[facility] PROGRESS NOTE  Samuel Reyes [id number] DOB: 28-Aug-1955 DOA: 08/21/2016  PCP: Maren Reamer, MD  Brief History/Interval Summary: 61 year old Caucasian male with a has medical history of alcohol abuse, COPD, presented with cough, congestion, shortness of breath and chest pain. Patient had been having nausea, vomiting. For the week prior to hospitalization. CT scan showed bronchiolitis with evidence for some consolidation as well. He was bringing up yellowish-green expectoration. Since he was recently hospitalized earlier this month, concern is for healthcare associated pneumonia.  Reason for Visit: Healthcare associated pneumonia  Consultants: None  Procedures: None  Antibiotics: Initially placed on azithromycin. Changed over to vancomycin, aztreonam and Levaquin.  Subjective/Interval History: Patient continues to feel short of breath and continues to have a cough. Denies any dizziness or lightheadedness. Still has some chest pain especially when he coughs. Continues to bring up yellowish expectoration.  ROS: Denies any nausea, vomiting since he has been here. Does complain of some numbness in his hands for ongoing for the past many weeks.  Objective:  Vital Signs  Vitals:   08/22/16 0752 08/22/16 0932 08/22/16 1039 08/22/16 1044  BP: 146/96  147/88 147/88  Pulse: 114  120 119  Resp: 24  22   Temp:      TempSrc:      SpO2: 96% 98% 96%   Weight:      Height:        Intake/Output Summary (Last 24 hours) at 08/22/16 1203 Last data filed at 08/22/16 1141  Gross per 24 hour  Intake             3750 ml  Output              300 ml  Net             3450 ml   Filed Weights   08/21/16 1114  Weight: 70.3 kg (155 lb)    General appearance: alert, cooperative, appears stated age and no distress Resp: Diminished air entry bilaterally with crackles at the bases. Wheezing bilaterally. Few rhonchi. Tachypneic. Not using accessory muscles. Cardio:  regular rate and rhythm, S1, S2 normal, no murmur, click, rub or gallop GI: soft, non-tender; bowel sounds normal; no masses,  no organomegaly Extremities: extremities normal, atraumatic, no cyanosis or edema Neurologic: Awake and alert. Oriented 3. No focal neurological deficits. Good strength bilateral upper and lower extremities.  Lab Results:  Data Reviewed: I have personally reviewed following labs and imaging studies  CBC:  Recent Labs Lab 08/21/16 1140 08/22/16 0158  WBC 11.7* 9.4  NEUTROABS 9.5*  --   HGB 12.1* 10.6*  HCT 37.4* 32.9*  MCV 74.1* 75.1*  PLT 307 [id number]    Basic Metabolic Panel:  Recent Labs Lab 08/21/16 1140 08/22/16 0158  NA 132* 136  K 5.0 4.2  CL 96* 102  CO2 25 23  GLUCOSE 116* 137*  BUN 6 5*  CREATININE 0.58* 0.51*  CALCIUM 8.8* 8.1*    GFR: Estimated Creatinine Clearance: 81.2 mL/min (by C-G formula based on SCr of 0.51 mg/dL (L)).  Liver Function Tests:  Recent Labs Lab 08/21/16 1303  AST 36  ALT 56  ALKPHOS 95  BILITOT 0.6  PROT 6.9  ALBUMIN 2.9*     Recent Labs Lab 08/21/16 1303  LIPASE 13     Radiology Studies: Dg Chest 2 View  Result Date: 08/21/2016 CLINICAL DATA:  Patient with cough, shortness of breath and fever. EXAM: CHEST  2 VIEW COMPARISON:  Chest radiograph 08/02/2016.  FINDINGS: Stable cardiac and mediastinal contours. No consolidative pulmonary opacities. No pleural effusion or pneumothorax. Upper abdominal surgical clips. Thoracic spine degenerative changes. IMPRESSION: No active cardiopulmonary disease. Electronically Signed   By: Lovey Newcomer M.D.   On: 08/21/2016 12:28   Ct Angio Chest Pe W And/or Wo Contrast  Result Date: 08/21/2016 CLINICAL DATA:  Hypoxia.  Diffuse abdominal pain. EXAM: CT ANGIOGRAPHY CHEST CT ABDOMEN AND PELVIS WITH CONTRAST TECHNIQUE: Multidetector CT imaging of the chest was performed using the standard protocol during bolus administration of intravenous contrast. Multiplanar CT  image reconstructions and MIPs were obtained to evaluate the vascular anatomy. Multidetector CT imaging of the abdomen and pelvis was performed using the standard protocol during bolus administration of intravenous contrast. CONTRAST:  100 cc Isovue 370 IV COMPARISON:  CT abdomen and pelvis 02/05/2015, CXR 08/02/2016 FINDINGS: CTA CHEST FINDINGS Cardiovascular: The study is of quality for the evaluation of pulmonary embolism. There are no filling defects in the central, lobar, segmental or subsegmental pulmonary artery branches to suggest acute pulmonary embolism. Great vessels are normal in course and caliber. Normal heart size. No significant pericardial fluid/thickening. Coronary arteriosclerosis along the left main, lad and circumflex. Mediastinum/Nodes: No discrete thyroid nodules. Unremarkable esophagus. Surgical clips at the GE junction again noted. No pathologically enlarged axillary nor mediastinal nodes. Right hilar lymphadenopathy 1.5 cm short axis may be reactive in etiology. Lungs/Pleura: Bilateral tree-in-bud densities throughout both lungs consistent with bronchiolitis with minimal peribronchial thickening and bronchiectasis especially in the upper lobes. Small nodular densities are seen in the right upper and lower lobes possibly representing pulmonary nodules versus inspissated mucus or areas of postinfectious or postinflammatory change, index nodules measuring 3 mm on images 43 in the right upper and two on image 76 in the right lower lobe measuring 3-4 mm each on the lung window series. No pleural effusion. Tiny pulmonary consolidation with air bronchogram in the right lower lobe possibly postinfectious or inflammatory, image 77 and abutting the posterior right lower lobe, image 33 are noted the this may represent a focus of atelectasis given its somewhat triangular appearance. Upper abdomen: See below Musculoskeletal:  No aggressive appearing focal osseous lesions. Review of the MIP images  confirms the above findings. CT ABDOMEN and PELVIS FINDINGS Hepatobiliary: Hepatic steatosis. Unremarkable gallbladder. No biliary dilatation. Focal fat along the falciform ligament. Mild micronodular moderate appearance of the left hepatic lobe again noted, image 21, series 601. Pancreas: Unremarkable. No pancreatic ductal dilatation or surrounding inflammatory changes. Spleen: Normal in size without focal abnormality. Adrenals/Urinary Tract: Normal bilateral adrenal glands. Kidneys enhance appropriately with tiny lower pole 3 mm right and interpolar left hypodensities statistically consistent with cysts but too small to further characterize. Physiologic distention of the bladder. No obstructive uropathy or nephrolithiasis. Stomach/Bowel: Surgical clips are noted at the GE junction in the adjacent to the posterior cortex of the spleen. There is no bowel obstruction. A few minimally distended small bowel loops in the left hemiabdomen extending into the left hemipelvis containing a mild amount of fluid may represent an enteritis. Normal-appearing appendix. There is no large bowel obstruction. Segmental areas of transmural thickening is seen involving the distal descending and sigmoid colon. This has the appearance of colonic spasm and underdistention. Colitis would be difficult to entirely exclude. Correlate. Vascular/Lymphatic: Aortic atherosclerosis. No enlarged abdominal or pelvic lymph nodes. Stable small perigastric varices. Reproductive: Central zone calcifications within the normal size prostate. Other: No abdominal wall hernia or abnormality. No abdominopelvic ascites. Musculoskeletal: Status post right inguinal hernia  repair. Degenerative disc disease L1-2 and L5-S1. Review of the MIP images confirms the above findings. IMPRESSION: 1. No acute pulmonary embolus. 2. Diffuse tree-in-bud appearance of the lungs consistent with bronchiolitis. 3. Small nodular opacities measuring between 3 and 4 mm in the right  upper and lower lobes cannot entirely exclude pulmonary nodules. No follow-up needed if patient is low-risk (and has no known or suspected primary neoplasm). Non-contrast chest CT can be considered in 12 months if patient is high-risk. This recommendation follows the consensus statement: Guidelines for Management of Incidental Pulmonary Nodules Detected on CT Images: From the Fleischner Society 2017; Radiology 2017; 284:228-243. 4. Tiny pulmonary consolidations in the right upper and lower lobes which may represent areas of atelectasis or possible areas of infection/inflammation. 5. Hepatic steatosis with stable suspected mild changes of cirrhosis. 6. Probable distal descending and sigmoid colonic spasm accounting for segmental areas of luminal narrowing and mild thickening. A mild colitis is not entirely excluded. Similarly there are a few fluid-filled mildly distended small bowel loops in the left hemiabdomen and pelvis which may reflect small bowel enteritis. Electronically Signed   By: Ashley Royalty M.D.   On: 08/21/2016 19:02   Ct Abdomen Pelvis W Contrast  Result Date: 08/21/2016 CLINICAL DATA:  Hypoxia.  Diffuse abdominal pain. EXAM: CT ANGIOGRAPHY CHEST CT ABDOMEN AND PELVIS WITH CONTRAST TECHNIQUE: Multidetector CT imaging of the chest was performed using the standard protocol during bolus administration of intravenous contrast. Multiplanar CT image reconstructions and MIPs were obtained to evaluate the vascular anatomy. Multidetector CT imaging of the abdomen and pelvis was performed using the standard protocol during bolus administration of intravenous contrast. CONTRAST:  100 cc Isovue 370 IV COMPARISON:  CT abdomen and pelvis 02/05/2015, CXR 08/02/2016 FINDINGS: CTA CHEST FINDINGS Cardiovascular: The study is of quality for the evaluation of pulmonary embolism. There are no filling defects in the central, lobar, segmental or subsegmental pulmonary artery branches to suggest acute pulmonary embolism.  Great vessels are normal in course and caliber. Normal heart size. No significant pericardial fluid/thickening. Coronary arteriosclerosis along the left main, lad and circumflex. Mediastinum/Nodes: No discrete thyroid nodules. Unremarkable esophagus. Surgical clips at the GE junction again noted. No pathologically enlarged axillary nor mediastinal nodes. Right hilar lymphadenopathy 1.5 cm short axis may be reactive in etiology. Lungs/Pleura: Bilateral tree-in-bud densities throughout both lungs consistent with bronchiolitis with minimal peribronchial thickening and bronchiectasis especially in the upper lobes. Small nodular densities are seen in the right upper and lower lobes possibly representing pulmonary nodules versus inspissated mucus or areas of postinfectious or postinflammatory change, index nodules measuring 3 mm on images 43 in the right upper and two on image 76 in the right lower lobe measuring 3-4 mm each on the lung window series. No pleural effusion. Tiny pulmonary consolidation with air bronchogram in the right lower lobe possibly postinfectious or inflammatory, image 77 and abutting the posterior right lower lobe, image 33 are noted the this may represent a focus of atelectasis given its somewhat triangular appearance. Upper abdomen: See below Musculoskeletal:  No aggressive appearing focal osseous lesions. Review of the MIP images confirms the above findings. CT ABDOMEN and PELVIS FINDINGS Hepatobiliary: Hepatic steatosis. Unremarkable gallbladder. No biliary dilatation. Focal fat along the falciform ligament. Mild micronodular moderate appearance of the left hepatic lobe again noted, image 21, series 601. Pancreas: Unremarkable. No pancreatic ductal dilatation or surrounding inflammatory changes. Spleen: Normal in size without focal abnormality. Adrenals/Urinary Tract: Normal bilateral adrenal glands. Kidneys enhance appropriately with tiny lower  pole 3 mm right and interpolar left hypodensities  statistically consistent with cysts but too small to further characterize. Physiologic distention of the bladder. No obstructive uropathy or nephrolithiasis. Stomach/Bowel: Surgical clips are noted at the GE junction in the adjacent to the posterior cortex of the spleen. There is no bowel obstruction. A few minimally distended small bowel loops in the left hemiabdomen extending into the left hemipelvis containing a mild amount of fluid may represent an enteritis. Normal-appearing appendix. There is no large bowel obstruction. Segmental areas of transmural thickening is seen involving the distal descending and sigmoid colon. This has the appearance of colonic spasm and underdistention. Colitis would be difficult to entirely exclude. Correlate. Vascular/Lymphatic: Aortic atherosclerosis. No enlarged abdominal or pelvic lymph nodes. Stable small perigastric varices. Reproductive: Central zone calcifications within the normal size prostate. Other: No abdominal wall hernia or abnormality. No abdominopelvic ascites. Musculoskeletal: Status post right inguinal hernia repair. Degenerative disc disease L1-2 and L5-S1. Review of the MIP images confirms the above findings. IMPRESSION: 1. No acute pulmonary embolus. 2. Diffuse tree-in-bud appearance of the lungs consistent with bronchiolitis. 3. Small nodular opacities measuring between 3 and 4 mm in the right upper and lower lobes cannot entirely exclude pulmonary nodules. No follow-up needed if patient is low-risk (and has no known or suspected primary neoplasm). Non-contrast chest CT can be considered in 12 months if patient is high-risk. This recommendation follows the consensus statement: Guidelines for Management of Incidental Pulmonary Nodules Detected on CT Images: From the Fleischner Society 2017; Radiology 2017; 284:228-243. 4. Tiny pulmonary consolidations in the right upper and lower lobes which may represent areas of atelectasis or possible areas of  infection/inflammation. 5. Hepatic steatosis with stable suspected mild changes of cirrhosis. 6. Probable distal descending and sigmoid colonic spasm accounting for segmental areas of luminal narrowing and mild thickening. A mild colitis is not entirely excluded. Similarly there are a few fluid-filled mildly distended small bowel loops in the left hemiabdomen and pelvis which may reflect small bowel enteritis. Electronically Signed   By: Ashley Royalty M.D.   On: 08/21/2016 19:02     Medications:  Scheduled: . buPROPion  200 mg Oral QHS  . enoxaparin (LOVENOX) injection  40 mg Subcutaneous Q24H  . folic acid  1 mg Oral Daily  . gabapentin  100 mg Oral BID  . ipratropium  0.5 mg Nebulization Q4H  . levalbuterol  0.63 mg Nebulization Q4H  . methylPREDNISolone (SOLU-MEDROL) injection  60 mg Intravenous Q6H  . metoprolol  50 mg Oral BID  . multivitamin with minerals  1 tablet Oral Daily  . nicotine  21 mg Transdermal Daily  . pantoprazole  40 mg Oral Daily  . thiamine  100 mg Oral Daily   Continuous: . sodium chloride 125 mL/hr at 08/22/16 0507  . aztreonam    . levofloxacin (LEVAQUIN) IV    . vancomycin     [id number], ibuprofen, levalbuterol, LORazepam **OR** LORazepam, ondansetron **OR** ondansetron (ZOFRAN) IV, traZODone  Assessment/Plan:  Principal Problem:   COPD exacerbation (HCC) Active Problems:   Alcoholism /alcohol abuse (HCC)   Nausea vomiting and diarrhea   Bronchiolitis    Acute hypoxic respiratory failure Secondary to pneumonia as well as COPD. Continue oxygen.  Healthcare associated pneumonia CT scan of the chest showed bronchiolitis, but there were some changes concerning for consolidation. Due to his symptoms of cough. And expectoration and recent hospitalization. He needs broad-spectrum antibiotics for now. Follow-up on culture data. Continue vancomycin, aztreonam and Levaquin. He should  also noted to have pulmonary nodules on CT scan. This may need  outpatient monitoring.  Acute COPD exacerbation Patient is noted to have sinus tachycardia. Change to Xopenex. Change to IV steroids. If he does not improve with this, may need to give nebulized budesonide as well.  History of alcohol abuse. He has been counseled. Continue with CIWA protocol. Hasn't had alcoholic drink in a week. Monitor for signs of withdrawal.  Likely alcoholic liver disease CT scan showed cirrhotic changes in the liver. Most likely due to alcohol intake. LFTs are normal. He will need outpatient evaluation for same.  Microcytic anemia. Check anemia panel. No evidence for overt bleeding.  Hand numbness Ongoing for a few weeks. Does not have any focal neurological deficits. Check for vitamin B-12 level. TSH was normal earlier this month.  Abnormal-appearing sigmoid colon on CT scan. Patient's abdomen is benign. Could be due to colonic spasms as noted in the CT report. No clinical evidence for colitis. Continue to monitor clinically for now.  DVT Prophylaxis: Lovenox    Code Status: Full code  Family Communication: Discussed with the patient  Disposition Plan: Initial as outlined above. Await improvement in respiratory status.    LOS: 1 day   [facility] Pager [phone] 08/22/2016, 12:03 PM  If 7PM-7AM, please contact night-coverage at [email or web address], password [facility]

## 2016-08-22 NOTE — ED Notes (Signed)
Pt placed on 3L [place], pt saturations were 84%, now pt at 94%

## 2016-08-22 NOTE — Progress Notes (Signed)
New pt admission from ED. Pt brought to the floor in stable condition. Vitals taken. Initial Assessment done. All immediate pertinent needs to patient addressed. Patient Guide given to patient. Important safety instructions relating to hospitalization reviewed with patient. Patient verbalized understanding. Will continue to monitor pt.  Berdine Rasmusson RN 

## 2016-08-23 LAB — IRON AND TIBC
Iron: 13 ug/dL — ABNORMAL LOW (ref 45–182)
SATURATION RATIOS: 5 % — AB (ref 17.9–39.5)
TIBC: 252 ug/dL (ref 250–450)
UIBC: 239 ug/dL

## 2016-08-23 LAB — CBC
HCT: 29.8 % — ABNORMAL LOW (ref 39.0–52.0)
Hemoglobin: 8.9 g/dL — ABNORMAL LOW (ref 13.0–17.0)
MCH: 23.2 pg — ABNORMAL LOW (ref 26.0–34.0)
MCHC: 29.9 g/dL — ABNORMAL LOW (ref 30.0–36.0)
MCV: 77.8 fL — AB (ref 78.0–100.0)
PLATELETS: 350 10*3/uL (ref 150–400)
RBC: 3.83 MIL/uL — ABNORMAL LOW (ref 4.22–5.81)
RDW: 21.9 % — ABNORMAL HIGH (ref 11.5–15.5)
WBC: 7 10*3/uL (ref 4.0–10.5)

## 2016-08-23 LAB — COMPREHENSIVE METABOLIC PANEL
ALT: 36 U/L (ref 17–63)
AST: 24 U/L (ref 15–41)
Albumin: 2.1 g/dL — ABNORMAL LOW (ref 3.5–5.0)
Alkaline Phosphatase: 70 U/L (ref 38–126)
Anion gap: 8 (ref 5–15)
BUN: 6 mg/dL (ref 6–20)
CHLORIDE: 102 mmol/L (ref 101–111)
CO2: 27 mmol/L (ref 22–32)
CREATININE: 0.46 mg/dL — AB (ref 0.61–1.24)
Calcium: 7.9 mg/dL — ABNORMAL LOW (ref 8.9–10.3)
GFR calc Af Amer: >60 mL/min (ref >60)
Glucose, Bld: 152 mg/dL — ABNORMAL HIGH (ref 65–99)
Potassium: 3.2 mmol/L — ABNORMAL LOW (ref 3.5–5.1)
Sodium: 137 mmol/L (ref 135–145)
Total Bilirubin: 0.4 mg/dL (ref 0.3–1.2)
Total Protein: 5.6 g/dL — ABNORMAL LOW (ref 6.5–8.1)

## 2016-08-23 LAB — FOLATE: Folate: 11.1 ng/mL (ref >5.9)

## 2016-08-23 LAB — RETICULOCYTES
RBC.: 3.83 MIL/uL — AB (ref 4.22–5.81)
RETIC CT PCT: 1.5 % (ref 0.4–3.1)
Retic Count, Absolute: 57.5 10*3/uL (ref 19.0–186.0)

## 2016-08-23 LAB — VITAMIN B12: Vitamin B-12: 615 pg/mL (ref 180–914)

## 2016-08-23 LAB — FERRITIN: Ferritin: 34 ng/mL (ref 24–336)

## 2016-08-23 MED ORDER — POTASSIUM CHLORIDE CRYS ER 20 MEQ PO TBCR
40.0000 meq | EXTENDED_RELEASE_TABLET | Freq: Once | ORAL | Status: AC
  Administered 2016-08-23: 40 meq via ORAL
  Filled 2016-08-23: qty 2

## 2016-08-23 MED ORDER — BUDESONIDE 0.25 MG/2ML IN SUSP
0.2500 mg | Freq: Two times a day (BID) | RESPIRATORY_TRACT | Status: DC
  Administered 2016-08-23 – 2016-08-27 (×5): 0.25 mg via RESPIRATORY_TRACT
  Filled 2016-08-23 (×8): qty 2

## 2016-08-23 NOTE — Progress Notes (Signed)
Initial Nutrition Assessment  INTERVENTION:  Provide Multivitamin with minerals daily Encouraged adequate healthful PO intake Add soft chopped meats to diet order   NUTRITION DIAGNOSIS:   Increased nutrient needs related to chronic illness as evidenced by estimated needs.   GOAL:   Patient will meet greater than or equal to 90% of their needs   MONITOR:   PO intake, Labs, Skin, Weight trends  REASON FOR ASSESSMENT:   Consult COPD Protocol  ASSESSMENT:   61 year old Caucasian male with a has medical history of alcohol abuse, COPD, presented with cough, congestion, shortness of breath and chest pain. Patient had been having nausea, vomiting. For the week prior to hospitalization. CT scan showed bronchiolitis with evidence for some consolidation as well.  Pt states that his appetite has been fine and he was eating well PTA. Per nursing notes, pt ate 25% of breakfast this morning. Pt has lunch tray at bedside at time of visit with ~50% consumed. Pt states that he doesn't like the food here. He also complains that he doesn't have teeth and has difficulty eating meats. Pt reports that he usually gets protein with every meal, but only eats vegetables and fruits 2-3 times per week. RD discussed the importance of nutrition and the difference that adequate protein and high intake of fruits and vegetables can make on chronic health issues. Pt is not interested in any protein or nutritional supplements at this time.   Labs: low iron, low potassium, low calcium, low hemoglobin, low albumin  Diet Order:  Diet Heart Room service appropriate? Yes; Fluid consistency: Thin  Skin:  Reviewed, no issues  Last BM:  1/24  Height:   Ht Readings from Last 1 Encounters:  08/22/16 5\' 4"  (1.626 m)    Weight:   Wt Readings from Last 1 Encounters:  08/23/16 140 lb 11.2 oz (63.8 kg)    Ideal Body Weight:  59.09 kg  BMI:  Body mass index is 24.15 kg/m.  Estimated Nutritional Needs:    Kcal:  1800-2000  Protein:  90-100 grams  Fluid:  2 L/day  EDUCATION NEEDS:   No education needs identified at this time  Scarlette Ar RD, CSP, LDN Inpatient Clinical Dietitian Pager: [phone] After Hours Pager: [phone]

## 2016-08-23 NOTE — Evaluation (Signed)
Physical Therapy Evaluation Patient Details Name: Samuel Reyes MRN: [id number] DOB: [birth year] Today's Date: 08/23/2016   History of Present Illness  Pt adm with [facility]. PMH - etoh abuse, copd, depression  Clinical Impression  Pt admitted with above diagnosis and presents to PT with functional limitations due to deficits listed below (See PT problem list). Pt needs skilled PT to maximize independence and safety to allow discharge to home if pt makes good progress. Currently pt weaker than on previous admission so will need to progress to be able to return home.     Follow Up Recommendations Home health PT    Equipment Recommendations  Other (comment) (to be determined)    Recommendations for Other Services       Precautions / Restrictions Precautions Precautions: Fall Restrictions Weight Bearing Restrictions: No      Mobility  Bed Mobility Overal bed mobility: Needs Assistance Bed Mobility: Supine to Sit;Sit to Supine     Supine to sit: Min assist Sit to supine: Supervision   General bed mobility comments: Assist to elevate trunk into sitting  Transfers Overall transfer level: Needs assistance Equipment used: 1 person hand held assist Transfers: Sit to/from Stand Sit to Stand: Min assist         General transfer comment: Assist to bring up hips and for balance  Ambulation/Gait Ambulation/Gait assistance: Min assist Ambulation Distance (Feet): 2 Feet Assistive device: 1 person hand held assist Gait Pattern/deviations: Step-to pattern;Decreased step length - right;Decreased step length - left;Shuffle Gait velocity: decr Gait velocity interpretation: Below normal speed for age/gender General Gait Details: Assist for balance and support  Stairs            Wheelchair Mobility    Modified Rankin (Stroke Patients Only)       Balance Overall balance assessment: Needs assistance Sitting-balance support: No upper extremity supported;Feet  supported Sitting balance-Leahy Scale: Good     Standing balance support: Single extremity supported Standing balance-Leahy Scale: Poor Standing balance comment: UE support for static standing                             Pertinent Vitals/Pain Pain Assessment: No/denies pain    Home Living Family/patient expects to be discharged to:: Private residence Living Arrangements: Non-relatives/Friends (roommate) Available Help at Discharge: Friend(s) Type of Home: Apartment Home Access: Elevator     Home Layout: One level Home Equipment: None Additional Comments: Used to have cane but it was stolen    Prior Function Level of Independence: Independent               Hand Dominance        Extremity/Trunk Assessment   Upper Extremity Assessment Upper Extremity Assessment: Defer to OT evaluation    Lower Extremity Assessment Lower Extremity Assessment: Generalized weakness       Communication   Communication: No difficulties  Cognition Arousal/Alertness: Awake/alert Behavior During Therapy: WFL for tasks assessed/performed Overall Cognitive Status: Within Functional Limits for tasks assessed                      General Comments      Exercises     Assessment/Plan    PT Assessment Patient needs continued PT services  PT Problem List Decreased strength;Decreased activity tolerance;Decreased balance;Decreased mobility;Decreased knowledge of use of DME;Cardiopulmonary status limiting activity          PT Treatment Interventions DME instruction;Gait training;Functional mobility training;Therapeutic activities;Therapeutic exercise;Balance training;Patient/family  education    PT Goals (Current goals can be found in the Care Plan section)  Acute Rehab PT Goals Patient Stated Goal: Not stated PT Goal Formulation: With patient Time For Goal Achievement: 08/30/16 Potential to Achieve Goals: Good    Frequency Min 3X/week   Barriers to  discharge        Co-evaluation               End of Session Equipment Utilized During Treatment: Oxygen Activity Tolerance: Patient limited by fatigue Patient left: in bed;with call bell/phone within reach           Time: [id number] PT Time Calculation (min) (ACUTE ONLY): 11 min   Charges:   PT Evaluation $PT Eval Moderate Complexity: 1 Procedure     PT G CodesShary Decamp [place] 09-17-2016, 3:30 PM  [employer] PT [phone]

## 2016-08-23 NOTE — Evaluation (Signed)
Clinical/Bedside Swallow Evaluation Patient Details  Name: Samuel Reyes MRN: [id number] Date of Birth: 10/07/1955  Today's Date: 08/23/2016 Time: SLP Start Time (ACUTE ONLY): 1015 SLP Stop Time (ACUTE ONLY): 1046 SLP Time Calculation (min) (ACUTE ONLY): 31 min  Past Medical History:  Past Medical History:  Diagnosis Date  . COPD (chronic obstructive pulmonary disease) ([place])   . Depression   . GERD (gastroesophageal reflux disease)   . Headache   . Hypertension   . Stroke [facility])    Past Surgical History:  Past Surgical History:  Procedure Laterality Date  . BACK SURGERY     3 cervical spine surgeries C4-C5 fused  . COLONOSCOPY N/A 01/04/2014   Procedure: COLONOSCOPY;  Surgeon: Danie Binder, MD;  Location: [facility];  Service: Endoscopy;  Laterality: N/A;  1:45  . ESOPHAGOGASTRODUODENOSCOPY N/A 01/04/2014   Procedure: ESOPHAGOGASTRODUODENOSCOPY (EGD);  Surgeon: Danie Binder, MD;  Location: [facility];  Service: Endoscopy;  Laterality: N/A;  . FINGER SURGERY Left    2nd, 3rd, & 4th fingers were cut off by table saw and reattached  . GASTRECTOMY    . HERNIA REPAIR    . INCISIONAL HERNIA REPAIR N/A 01/20/2014   Procedure: LAPAROSCOPIC RECURRENT  INCISIONAL HERNIA with mesh;  Surgeon: Edward Jolly, MD;  Location: [facility];  Service: General;  Laterality: N/A;  . rt knee arthroscopic surgery    . SHOULDER SURGERY Bilateral    3 surgeries on on left, 2 surgeries on right    HPI:  61 y.o.malewith medical history significant of EtOH abuse, COPD, GERD, TIA 2010 admitted with COPD exacerbation, N/V, HCAP. Per pt's report, he has had three "neck" surgeries when he lived in [place], at least two of which were a C4-5 fusion and removal of a cervical osteophyte.  Pt describes having an MBS during that time. He had an endoscopy locally in 2015, which revealed probable Barrett's esophagus.  He has ongoing issues with globus, but no longer takes Omeprazol, stating it had little effect.   Pt underwent a clinical swallow evaluation with recs for reflux precautions, but no oropharyngeal dysphagia 08/31/14.  CT brain 1/4 shows endplate osteophytes at multiple levels.  He had a terrible coughing episode with breakfast 1/25, which precipitated orders for swallowing assessment.    Assessment / Plan / Recommendation Clinical Impression  Pt presents with s/s of ongoing esophageal deficits c/b globus, regurgitation, hoarse voice (related to potential laryngopharyngeal reflux).  Endoscopy 2015 showed early Barrett's esophagus.  Pt would benefit from further esosphageal w/u (esophagram vs f/u endo or both) to address ongoing issues.  SLP will follow for results, education to patient.     Aspiration Risk       Diet Recommendation   continue regular diet as tolerated, thin liquids  Medication Administration: Whole meds with liquid    Other  Recommendations Recommended Consults: Consider GI evaluation Oral Care Recommendations: Oral care BID   Follow up Recommendations  (tbd)      Frequency and Duration            Prognosis        Swallow Study   General Date of Onset: 08/21/16 HPI: 61 y.o.malewith medical history significant of EtOH abuse, COPD, GERD, TIA 2010 admitted with COPD exacerbation, N/V, HCAP. Per pt's report, he has had three "neck" surgeries when he lived in [place], at least two of which were a C4-5 fusion and removal of a cervical osteophyte.  Pt describes having an MBS during that time.  He had an endoscopy locally in 2015, which revealed probable Barrett's esophagus.  He has ongoing issues with globus, but no longer takes Omeprazol, stating it had little effect.  Pt underwent a clinical swallow evaluation with recs for reflux precautions, but no oropharyngeal dysphagia 08/31/14.  CT brain 1/4 shows endplate osteophytes at multiple levels.  He had a terrible coughing episode with breakfast 1/25, which precipitated orders for swallowing assessment.  Type of Study:  Bedside Swallow Evaluation Previous Swallow Assessment: see hx Diet Prior to this Study: Regular;Thin liquids Temperature Spikes Noted: No Respiratory Status: Nasal cannula History of Recent Intubation: No Behavior/Cognition: Alert;Cooperative;Pleasant mood Oral Cavity Assessment: Within Functional Limits Oral Care Completed by SLP: No Oral Cavity - Dentition: Edentulous Vision: Functional for self-feeding Self-Feeding Abilities: Able to feed self Patient Positioning: Upright in bed Baseline Vocal Quality: Hoarse;Low vocal intensity    Oral/Motor/Sensory Function     Ice Chips Ice chips: Within functional limits   Thin Liquid Thin Liquid: Within functional limits Presentation: Cup;Straw    Nectar Thick Nectar Thick Liquid: Not tested   Honey Thick Honey Thick Liquid: Not tested   Puree Puree: Within functional limits   Solid   GO   Solid: Within functional limits        Samuel Reyes 08/23/2016,11:01 AM

## 2016-08-23 NOTE — Progress Notes (Signed)
[facility] PROGRESS NOTE  Samuel Reyes [id number] DOB: 09-Mar-1956 DOA: 08/21/2016  PCP: Maren Reamer, MD  Brief History/Interval Summary: 61 year old Caucasian male with a has medical history of alcohol abuse, COPD, presented with cough, congestion, shortness of breath and chest pain. Patient had been having nausea, vomiting. For the week prior to hospitalization. CT scan showed bronchiolitis with evidence for some consolidation as well. He was bringing up yellowish-green expectoration. Since he was recently hospitalized earlier this month, concern is for healthcare associated pneumonia.  Reason for Visit: Healthcare associated pneumonia  Consultants: None  Procedures: None  Antibiotics: Initially placed on azithromycin. Changed over to vancomycin, aztreonam and Levaquin.  Subjective/Interval History: Patient states that he is not much better compared to yesterday. Continues to have a cough, clear expectoration. Denies any chest pain.   ROS: Denies any nausea, vomiting.  Objective:  Vital Signs  Vitals:   08/23/16 0014 08/23/16 0403 08/23/16 0548 08/23/16 0736  BP:   138/75   Pulse:   88   Resp:   18   Temp:   97.8 F (36.6 C)   TempSrc:   Oral   SpO2: 98% 99% 98% 95%  Weight:   63.8 kg (140 lb 11.2 oz)   Height:        Intake/Output Summary (Last 24 hours) at 08/23/16 0823 Last data filed at 08/23/16 0600  Gross per 24 hour  Intake          4478.75 ml  Output              650 ml  Net          3828.75 ml   Filed Weights   08/21/16 1114 08/23/16 0548  Weight: 70.3 kg (155 lb) 63.8 kg (140 lb 11.2 oz)    General appearance: alert, cooperative, appears stated age and no distress Resp: Improved air entry is noted today. Crackles continue to be present at the bases. Continues to have wheezing bilaterally. No rhonchi. Not as tachypneic as yesterday. Not using accessory muscles.  Cardio: regular rate and rhythm, S1, S2 normal, no murmur, click, rub  or gallop GI: soft, non-tender; bowel sounds normal; no masses,  no organomegaly Neurologic: Awake and alert. Oriented 3. No focal neurological deficits. Good strength bilateral upper and lower extremities.  Lab Results:  Data Reviewed: I have personally reviewed following labs and imaging studies  CBC:  Recent Labs Lab 08/21/16 1140 08/22/16 0158 08/23/16 0518  WBC 11.7* 9.4 7.0  NEUTROABS 9.5*  --   --   HGB 12.1* 10.6* 8.9*  HCT 37.4* 32.9* 29.8*  MCV 74.1* 75.1* 77.8*  PLT 307 325 [id number]    Basic Metabolic Panel:  Recent Labs Lab 08/21/16 1140 08/22/16 0158 08/23/16 0518  NA 132* 136 137  K 5.0 4.2 3.2*  CL 96* 102 102  CO2 25 23 27   GLUCOSE 116* 137* 152*  BUN 6 5* 6  CREATININE 0.58* 0.51* 0.46*  CALCIUM 8.8* 8.1* 7.9*    GFR: Estimated Creatinine Clearance: 81.2 mL/min (by C-G formula based on SCr of 0.46 mg/dL (L)).  Liver Function Tests:  Recent Labs Lab 08/21/16 1303 08/23/16 0518  AST 36 24  ALT 56 36  ALKPHOS 95 70  BILITOT 0.6 0.4  PROT 6.9 5.6*  ALBUMIN 2.9* 2.1*     Recent Labs Lab 08/21/16 1303  LIPASE 13     Radiology Studies: Dg Chest 2 View  Result Date: 08/21/2016 CLINICAL DATA:  Patient with cough, shortness of  breath and fever. EXAM: CHEST  2 VIEW COMPARISON:  Chest radiograph 08/02/2016. FINDINGS: Stable cardiac and mediastinal contours. No consolidative pulmonary opacities. No pleural effusion or pneumothorax. Upper abdominal surgical clips. Thoracic spine degenerative changes. IMPRESSION: No active cardiopulmonary disease. Electronically Signed   By: Lovey Newcomer M.D.   On: 08/21/2016 12:28   Ct Angio Chest Pe W And/or Wo Contrast  Result Date: 08/21/2016 CLINICAL DATA:  Hypoxia.  Diffuse abdominal pain. EXAM: CT ANGIOGRAPHY CHEST CT ABDOMEN AND PELVIS WITH CONTRAST TECHNIQUE: Multidetector CT imaging of the chest was performed using the standard protocol during bolus administration of intravenous contrast. Multiplanar CT  image reconstructions and MIPs were obtained to evaluate the vascular anatomy. Multidetector CT imaging of the abdomen and pelvis was performed using the standard protocol during bolus administration of intravenous contrast. CONTRAST:  100 cc Isovue 370 IV COMPARISON:  CT abdomen and pelvis 02/05/2015, CXR 08/02/2016 FINDINGS: CTA CHEST FINDINGS Cardiovascular: The study is of quality for the evaluation of pulmonary embolism. There are no filling defects in the central, lobar, segmental or subsegmental pulmonary artery branches to suggest acute pulmonary embolism. Great vessels are normal in course and caliber. Normal heart size. No significant pericardial fluid/thickening. Coronary arteriosclerosis along the left main, lad and circumflex. Mediastinum/Nodes: No discrete thyroid nodules. Unremarkable esophagus. Surgical clips at the GE junction again noted. No pathologically enlarged axillary nor mediastinal nodes. Right hilar lymphadenopathy 1.5 cm short axis may be reactive in etiology. Lungs/Pleura: Bilateral tree-in-bud densities throughout both lungs consistent with bronchiolitis with minimal peribronchial thickening and bronchiectasis especially in the upper lobes. Small nodular densities are seen in the right upper and lower lobes possibly representing pulmonary nodules versus inspissated mucus or areas of postinfectious or postinflammatory change, index nodules measuring 3 mm on images 43 in the right upper and two on image 76 in the right lower lobe measuring 3-4 mm each on the lung window series. No pleural effusion. Tiny pulmonary consolidation with air bronchogram in the right lower lobe possibly postinfectious or inflammatory, image 77 and abutting the posterior right lower lobe, image 33 are noted the this may represent a focus of atelectasis given its somewhat triangular appearance. Upper abdomen: See below Musculoskeletal:  No aggressive appearing focal osseous lesions. Review of the MIP images  confirms the above findings. CT ABDOMEN and PELVIS FINDINGS Hepatobiliary: Hepatic steatosis. Unremarkable gallbladder. No biliary dilatation. Focal fat along the falciform ligament. Mild micronodular moderate appearance of the left hepatic lobe again noted, image 21, series 601. Pancreas: Unremarkable. No pancreatic ductal dilatation or surrounding inflammatory changes. Spleen: Normal in size without focal abnormality. Adrenals/Urinary Tract: Normal bilateral adrenal glands. Kidneys enhance appropriately with tiny lower pole 3 mm right and interpolar left hypodensities statistically consistent with cysts but too small to further characterize. Physiologic distention of the bladder. No obstructive uropathy or nephrolithiasis. Stomach/Bowel: Surgical clips are noted at the GE junction in the adjacent to the posterior cortex of the spleen. There is no bowel obstruction. A few minimally distended small bowel loops in the left hemiabdomen extending into the left hemipelvis containing a mild amount of fluid may represent an enteritis. Normal-appearing appendix. There is no large bowel obstruction. Segmental areas of transmural thickening is seen involving the distal descending and sigmoid colon. This has the appearance of colonic spasm and underdistention. Colitis would be difficult to entirely exclude. Correlate. Vascular/Lymphatic: Aortic atherosclerosis. No enlarged abdominal or pelvic lymph nodes. Stable small perigastric varices. Reproductive: Central zone calcifications within the normal size prostate. Other: No abdominal  wall hernia or abnormality. No abdominopelvic ascites. Musculoskeletal: Status post right inguinal hernia repair. Degenerative disc disease L1-2 and L5-S1. Review of the MIP images confirms the above findings. IMPRESSION: 1. No acute pulmonary embolus. 2. Diffuse tree-in-bud appearance of the lungs consistent with bronchiolitis. 3. Small nodular opacities measuring between 3 and 4 mm in the right  upper and lower lobes cannot entirely exclude pulmonary nodules. No follow-up needed if patient is low-risk (and has no known or suspected primary neoplasm). Non-contrast chest CT can be considered in 12 months if patient is high-risk. This recommendation follows the consensus statement: Guidelines for Management of Incidental Pulmonary Nodules Detected on CT Images: From the Fleischner Society 2017; Radiology 2017; 284:228-243. 4. Tiny pulmonary consolidations in the right upper and lower lobes which may represent areas of atelectasis or possible areas of infection/inflammation. 5. Hepatic steatosis with stable suspected mild changes of cirrhosis. 6. Probable distal descending and sigmoid colonic spasm accounting for segmental areas of luminal narrowing and mild thickening. A mild colitis is not entirely excluded. Similarly there are a few fluid-filled mildly distended small bowel loops in the left hemiabdomen and pelvis which may reflect small bowel enteritis. Electronically Signed   By: Ashley Royalty M.D.   On: 08/21/2016 19:02   Ct Abdomen Pelvis W Contrast  Result Date: 08/21/2016 CLINICAL DATA:  Hypoxia.  Diffuse abdominal pain. EXAM: CT ANGIOGRAPHY CHEST CT ABDOMEN AND PELVIS WITH CONTRAST TECHNIQUE: Multidetector CT imaging of the chest was performed using the standard protocol during bolus administration of intravenous contrast. Multiplanar CT image reconstructions and MIPs were obtained to evaluate the vascular anatomy. Multidetector CT imaging of the abdomen and pelvis was performed using the standard protocol during bolus administration of intravenous contrast. CONTRAST:  100 cc Isovue 370 IV COMPARISON:  CT abdomen and pelvis 02/05/2015, CXR 08/02/2016 FINDINGS: CTA CHEST FINDINGS Cardiovascular: The study is of quality for the evaluation of pulmonary embolism. There are no filling defects in the central, lobar, segmental or subsegmental pulmonary artery branches to suggest acute pulmonary embolism.  Great vessels are normal in course and caliber. Normal heart size. No significant pericardial fluid/thickening. Coronary arteriosclerosis along the left main, lad and circumflex. Mediastinum/Nodes: No discrete thyroid nodules. Unremarkable esophagus. Surgical clips at the GE junction again noted. No pathologically enlarged axillary nor mediastinal nodes. Right hilar lymphadenopathy 1.5 cm short axis may be reactive in etiology. Lungs/Pleura: Bilateral tree-in-bud densities throughout both lungs consistent with bronchiolitis with minimal peribronchial thickening and bronchiectasis especially in the upper lobes. Small nodular densities are seen in the right upper and lower lobes possibly representing pulmonary nodules versus inspissated mucus or areas of postinfectious or postinflammatory change, index nodules measuring 3 mm on images 43 in the right upper and two on image 76 in the right lower lobe measuring 3-4 mm each on the lung window series. No pleural effusion. Tiny pulmonary consolidation with air bronchogram in the right lower lobe possibly postinfectious or inflammatory, image 77 and abutting the posterior right lower lobe, image 33 are noted the this may represent a focus of atelectasis given its somewhat triangular appearance. Upper abdomen: See below Musculoskeletal:  No aggressive appearing focal osseous lesions. Review of the MIP images confirms the above findings. CT ABDOMEN and PELVIS FINDINGS Hepatobiliary: Hepatic steatosis. Unremarkable gallbladder. No biliary dilatation. Focal fat along the falciform ligament. Mild micronodular moderate appearance of the left hepatic lobe again noted, image 21, series 601. Pancreas: Unremarkable. No pancreatic ductal dilatation or surrounding inflammatory changes. Spleen: Normal in size without focal  abnormality. Adrenals/Urinary Tract: Normal bilateral adrenal glands. Kidneys enhance appropriately with tiny lower pole 3 mm right and interpolar left hypodensities  statistically consistent with cysts but too small to further characterize. Physiologic distention of the bladder. No obstructive uropathy or nephrolithiasis. Stomach/Bowel: Surgical clips are noted at the GE junction in the adjacent to the posterior cortex of the spleen. There is no bowel obstruction. A few minimally distended small bowel loops in the left hemiabdomen extending into the left hemipelvis containing a mild amount of fluid may represent an enteritis. Normal-appearing appendix. There is no large bowel obstruction. Segmental areas of transmural thickening is seen involving the distal descending and sigmoid colon. This has the appearance of colonic spasm and underdistention. Colitis would be difficult to entirely exclude. Correlate. Vascular/Lymphatic: Aortic atherosclerosis. No enlarged abdominal or pelvic lymph nodes. Stable small perigastric varices. Reproductive: Central zone calcifications within the normal size prostate. Other: No abdominal wall hernia or abnormality. No abdominopelvic ascites. Musculoskeletal: Status post right inguinal hernia repair. Degenerative disc disease L1-2 and L5-S1. Review of the MIP images confirms the above findings. IMPRESSION: 1. No acute pulmonary embolus. 2. Diffuse tree-in-bud appearance of the lungs consistent with bronchiolitis. 3. Small nodular opacities measuring between 3 and 4 mm in the right upper and lower lobes cannot entirely exclude pulmonary nodules. No follow-up needed if patient is low-risk (and has no known or suspected primary neoplasm). Non-contrast chest CT can be considered in 12 months if patient is high-risk. This recommendation follows the consensus statement: Guidelines for Management of Incidental Pulmonary Nodules Detected on CT Images: From the Fleischner Society 2017; Radiology 2017; 284:228-243. 4. Tiny pulmonary consolidations in the right upper and lower lobes which may represent areas of atelectasis or possible areas of  infection/inflammation. 5. Hepatic steatosis with stable suspected mild changes of cirrhosis. 6. Probable distal descending and sigmoid colonic spasm accounting for segmental areas of luminal narrowing and mild thickening. A mild colitis is not entirely excluded. Similarly there are a few fluid-filled mildly distended small bowel loops in the left hemiabdomen and pelvis which may reflect small bowel enteritis. Electronically Signed   By: Ashley Royalty M.D.   On: 08/21/2016 19:02     Medications:  Scheduled: . aztreonam  2 g Intravenous Q8H  . budesonide (PULMICORT) nebulizer solution  0.25 mg Nebulization BID  . buPROPion  200 mg Oral QHS  . enoxaparin (LOVENOX) injection  40 mg Subcutaneous Q24H  . folic acid  1 mg Oral Daily  . gabapentin  100 mg Oral BID  . ipratropium  0.5 mg Nebulization Q4H  . levalbuterol  0.63 mg Nebulization Q4H  . levofloxacin (LEVAQUIN) IV  750 mg Intravenous Q24H  . methylPREDNISolone (SOLU-MEDROL) injection  60 mg Intravenous Q6H  . metoprolol  50 mg Oral BID  . multivitamin with minerals  1 tablet Oral Daily  . nicotine  21 mg Transdermal Daily  . pantoprazole  40 mg Oral Daily  . potassium chloride  40 mEq Oral Once  . thiamine  100 mg Oral Daily  . vancomycin  1,000 mg Intravenous Q8H   Continuous: . sodium chloride 100 mL/hr at 08/22/16 2027   [id number] & mag hydroxide-simeth, benzonatate, ibuprofen, levalbuterol, LORazepam **OR** LORazepam, ondansetron **OR** ondansetron (ZOFRAN) IV, traZODone  Assessment/Plan:  Principal Problem:   HCAP (healthcare-associated pneumonia) Active Problems:   Acute respiratory failure with hypoxia (HCC)   Alcoholism /alcohol abuse (HCC)   COPD exacerbation (HCC)   Nausea vomiting and diarrhea   Bronchiolitis    Acute  hypoxic respiratory failure Secondary to pneumonia as well as COPD. Continue oxygen. Seems to be slowly improving.  Healthcare associated pneumonia CT scan of the chest showed bronchiolitis, but  there were some changes concerning for consolidation. Follow-up on culture data, which are negative so far. Continue vancomycin, aztreonam and Levaquin. He should also noted to have pulmonary nodules on CT scan. This may need outpatient monitoring.  Acute COPD exacerbation Continues to have wheezing. Continue nebulizer treatments, steroids. Add budesonide nebulization as well.   History of alcohol abuse. He has been counseled. Continue with CIWA protocol. Hasn't had alcoholic drink in a week. Monitor for signs of withdrawal.  Likely alcoholic liver disease CT scan showed cirrhotic changes in the liver. Most likely due to alcohol intake. LFTs are normal. He will need outpatient evaluation for same.  Microcytic anemia. Anemia panel reviewed. He appears to have some degree of iron deficiency. Vitamin B-12 level was 615. No overt bleeding noted. Check stool for occult blood. Will need outpatient evaluation for same. Some drop in hemoglobin as noted this morning. Continue to monitor.  Hand numbness Ongoing for a few weeks. Does not have any focal neurological deficits. B-12 level is normal. TSH was normal earlier this month. Will need outpatient evaluation for neuropathy. CT scan of his head earlier this month, did not show any acute findings.  Abnormal-appearing sigmoid colon on CT scan. Patient's abdomen is benign. Could be due to colonic spasms as noted in the CT report. No clinical evidence for colitis. Continue to monitor clinically for now.  Hypokalemia  Will be repleted. Check magnesium level.  DVT Prophylaxis: Lovenox    Code Status: Full code  Family Communication: Discussed with the patient  Disposition Plan: Patient is very slow to improve. Continue treatment as outlined above. Initiate PT and OT.    LOS: 2 days   [facility] Pager [phone] 08/23/2016, 8:23 AM  If 7PM-7AM, please contact night-coverage at [email or web address], password [facility]

## 2016-08-24 LAB — CBC
HEMATOCRIT: 29.4 % — AB (ref 39.0–52.0)
HEMOGLOBIN: 8.9 g/dL — AB (ref 13.0–17.0)
MCH: 23.2 pg — ABNORMAL LOW (ref 26.0–34.0)
MCHC: 30.3 g/dL (ref 30.0–36.0)
MCV: 76.8 fL — ABNORMAL LOW (ref 78.0–100.0)
Platelets: 378 10*3/uL (ref 150–400)
RBC: 3.83 MIL/uL — AB (ref 4.22–5.81)
RDW: 21.6 % — ABNORMAL HIGH (ref 11.5–15.5)
WBC: 11.5 10*3/uL — ABNORMAL HIGH (ref 4.0–10.5)

## 2016-08-24 LAB — BASIC METABOLIC PANEL
ANION GAP: 9 (ref 5–15)
BUN: 7 mg/dL (ref 6–20)
CHLORIDE: 97 mmol/L — AB (ref 101–111)
CO2: 30 mmol/L (ref 22–32)
CREATININE: 0.51 mg/dL — AB (ref 0.61–1.24)
Calcium: 8 mg/dL — ABNORMAL LOW (ref 8.9–10.3)
GFR calc non Af Amer: >60 mL/min (ref >60)
Glucose, Bld: 136 mg/dL — ABNORMAL HIGH (ref 65–99)
POTASSIUM: 3.3 mmol/L — AB (ref 3.5–5.1)
SODIUM: 136 mmol/L (ref 135–145)

## 2016-08-24 LAB — MAGNESIUM: MAGNESIUM: 1.9 mg/dL (ref 1.7–2.4)

## 2016-08-24 MED ORDER — POTASSIUM CHLORIDE CRYS ER 20 MEQ PO TBCR
40.0000 meq | EXTENDED_RELEASE_TABLET | Freq: Once | ORAL | Status: AC
  Administered 2016-08-24: 40 meq via ORAL
  Filled 2016-08-24: qty 2

## 2016-08-24 MED ORDER — IPRATROPIUM BROMIDE 0.02 % IN SOLN: 0.5000 mg | RESPIRATORY_TRACT | Status: DC | PRN

## 2016-08-24 MED ORDER — LEVALBUTEROL HCL 0.63 MG/3ML IN NEBU: 0.6300 mg | INHALATION_SOLUTION | RESPIRATORY_TRACT | Status: DC | PRN

## 2016-08-24 NOTE — Progress Notes (Signed)
Physical Therapy Treatment Patient Details Name: Samuel Reyes MRN: [id number] DOB: 29-Apr-1956 Today's Date: 08/24/2016    History of Present Illness Pt adm with HCAP. PMH - etoh abuse, copd, depression    PT Comments    Pt making progress but unsure if he is going to progress quickly enough to return home where he will have to be independent with mobility and ADL's. Recommend ST-SNF at this time. If pt progresses quickly over the weekend or has extended stay he may reach level needed to return straight home.  Follow Up Recommendations  SNF     Equipment Recommendations  Rolling walker with 5" wheels    Recommendations for Other Services       Precautions / Restrictions Precautions Precautions: Fall Restrictions Weight Bearing Restrictions: No    Mobility  Bed Mobility Overal bed mobility: Needs Assistance Bed Mobility: Supine to Sit;Sit to Supine     Supine to sit: Supervision Sit to supine: Supervision   General bed mobility comments: Incr time  Transfers Overall transfer level: Needs assistance Equipment used: Rolling walker (2 wheeled) Transfers: Sit to/from Stand Sit to Stand: Min assist         General transfer comment: assist for balance  Ambulation/Gait Ambulation/Gait assistance: Min assist Ambulation Distance (Feet): 100 Feet Assistive device: Rolling walker (2 wheeled) Gait Pattern/deviations: Decreased step length - right;Decreased step length - left;Shuffle;Step-through pattern;Drifts right/left Gait velocity: decr Gait velocity interpretation: Below normal speed for age/gender General Gait Details: Assist for balance and verbal cues to stay closer to walker. Amb on RA with SpO2 94% with amb. Dyspnea 2/4   Stairs            Wheelchair Mobility    Modified Rankin (Stroke Patients Only)       Balance Overall balance assessment: Needs assistance Sitting-balance support: No upper extremity supported;Feet supported Sitting  balance-Leahy Scale: Good     Standing balance support: Single extremity supported Standing balance-Leahy Scale: Poor Standing balance comment: UE support for static standing                    Cognition Arousal/Alertness: Awake/alert Behavior During Therapy: WFL for tasks assessed/performed Overall Cognitive Status: Within Functional Limits for tasks assessed                      Exercises      General Comments        Pertinent Vitals/Pain Pain Assessment: No/denies pain    Home Living                      Prior Function            PT Goals (current goals can now be found in the care plan section) Acute Rehab PT Goals Patient Stated Goal: Not stated Progress towards PT goals: Progressing toward goals    Frequency    Min 3X/week      PT Plan Discharge plan needs to be updated    Co-evaluation             End of Session Equipment Utilized During Treatment: Gait belt Activity Tolerance: Patient limited by fatigue Patient left: in bed;with call bell/phone within reach;with bed alarm set     Time: 1444-1500 PT Time Calculation (min) (ACUTE ONLY): 16 min  Charges:  $Gait Training: 8-22 mins                    G Codes:  Shary Decamp [place]ok 08/24/2016, 3:27 PM [employer] PT [phone]

## 2016-08-24 NOTE — Progress Notes (Signed)
[facility] PROGRESS NOTE  Riel Korson [id number] DOB: [birth year] DOA: 08/21/2016  PCP: Maren Reamer, MD  Brief History/Interval Summary: 61 year old Caucasian male with a has medical history of alcohol abuse, COPD, presented with cough, congestion, shortness of breath and chest pain. Patient had been having nausea, vomiting. For the week prior to hospitalization. CT scan showed bronchiolitis with evidence for some consolidation as well. He was bringing up yellowish-green expectoration. Since he was recently hospitalized earlier this month, concern was for healthcare associated pneumonia.  Reason for Visit: Healthcare associated pneumonia  Consultants: None  Procedures: None  Antibiotics: Initially placed on azithromycin. Changed over to vancomycin, aztreonam and Levaquin. Vancomycin and aztreonam discontinued 1/26 On Levaquin alone from 1/26  Subjective/Interval History: Patient had a coughing spell yesterday, which took a long time to resolve. Overall he does feel better today compared to yesterday. Not wheezing as much. Cough is improved.    ROS: Denies any nausea, vomiting.  Objective:  Vital Signs  Vitals:   08/23/16 2100 08/24/16 0105 08/24/16 0300 08/24/16 0651  BP: 119/67  (!) 150/90 (!) 165/97  Pulse: 100  89 84  Resp: 18   20  Temp: 98.4 F (36.9 C)   98.1 F (36.7 C)  TempSrc: Oral   Oral  SpO2: 93% 95%  95%  Weight:    64.2 kg (141 lb 9.6 oz)  Height:        Intake/Output Summary (Last 24 hours) at 08/24/16 0821 Last data filed at 08/24/16 0654  Gross per 24 hour  Intake           2785.5 ml  Output             1775 ml  Net           1010.5 ml   Filed Weights   08/21/16 1114 08/23/16 0548 08/24/16 0651  Weight: 70.3 kg (155 lb) 63.8 kg (140 lb 11.2 oz) 64.2 kg (141 lb 9.6 oz)    General appearance: alert, cooperative, appears stated age and no distress Resp: Normal effort. Not using accessory muscles. Continues to have wheezing  but improved compared to yesterday. A few crackles at the bases.  Cardio: regular rate and rhythm, S1, S2 normal, no murmur, click, rub or gallop GI: soft, non-tender; bowel sounds normal; no masses,  no organomegaly Neurologic: Awake and alert. Oriented 3. No focal neurological deficits. Good strength bilateral upper and lower extremities.  Lab Results:  Data Reviewed: I have personally reviewed following labs and imaging studies  CBC:  Recent Labs Lab 08/21/16 1140 08/22/16 0158 08/23/16 0518 08/24/16 0409  WBC 11.7* 9.4 7.0 11.5*  NEUTROABS 9.5*  --   --   --   HGB 12.1* 10.6* 8.9* 8.9*  HCT 37.4* 32.9* 29.8* 29.4*  MCV 74.1* 75.1* 77.8* 76.8*  PLT 307 325 350 [id number]    Basic Metabolic Panel:  Recent Labs Lab 08/21/16 1140 08/22/16 0158 08/23/16 0518 08/24/16 0409  NA 132* 136 137 136  K 5.0 4.2 3.2* 3.3*  CL 96* 102 102 97*  CO2 25 23 27 30   GLUCOSE 116* 137* 152* 136*  BUN 6 5* 6 7  CREATININE 0.58* 0.51* 0.46* 0.51*  CALCIUM 8.8* 8.1* 7.9* 8.0*  MG  --   --   --  1.9    GFR: Estimated Creatinine Clearance: 81.2 mL/min (by C-G formula based on SCr of 0.51 mg/dL (L)).  Liver Function Tests:  Recent Labs Lab 08/21/16 1303 08/23/16 0518  AST 36  24  ALT 56 36  ALKPHOS 95 70  BILITOT 0.6 0.4  PROT 6.9 5.6*  ALBUMIN 2.9* 2.1*     Recent Labs Lab 08/21/16 1303  LIPASE 13     Radiology Studies: No results found.   Medications:  Scheduled: . budesonide (PULMICORT) nebulizer solution  0.25 mg Nebulization BID  . buPROPion  200 mg Oral QHS  . enoxaparin (LOVENOX) injection  40 mg Subcutaneous Q24H  . folic acid  1 mg Oral Daily  . gabapentin  100 mg Oral BID  . ipratropium  0.5 mg Nebulization Q4H  . levalbuterol  0.63 mg Nebulization Q4H  . levofloxacin (LEVAQUIN) IV  750 mg Intravenous Q24H  . methylPREDNISolone (SOLU-MEDROL) injection  60 mg Intravenous Q6H  . metoprolol  50 mg Oral BID  . multivitamin with minerals  1 tablet Oral Daily    . nicotine  21 mg Transdermal Daily  . pantoprazole  40 mg Oral Daily  . potassium chloride  40 mEq Oral Once  . thiamine  100 mg Oral Daily  . vancomycin  1,000 mg Intravenous Q8H   Continuous: . sodium chloride 10 mL/hr at 08/24/16 0215   [id number] & mag hydroxide-simeth, benzonatate, ibuprofen, levalbuterol, LORazepam **OR** LORazepam, ondansetron **OR** ondansetron (ZOFRAN) IV, traZODone  Assessment/Plan:  Principal Problem:   HCAP (healthcare-associated pneumonia) Active Problems:   Acute respiratory failure with hypoxia (HCC)   Alcoholism /alcohol abuse (HCC)   COPD exacerbation (HCC)   Nausea vomiting and diarrhea   Bronchiolitis    Acute hypoxic respiratory failure Secondary to pneumonia as well as COPD. wean oxygen. Seems to be slowly improving.  Healthcare associated pneumonia CT scan of the chest showed bronchiolitis, but there were some changes concerning for consolidation. Hence, patient was treated with broad-spectrum antibiotics. Cultures are all negative so far. Patient shows improvement clinically. Okay to stop vancomycin and aztreonam. Continue on Levaquin for now.   Pulmonary nodules He is also noted to have pulmonary nodules on CT scan. This may need outpatient monitoring.  Acute COPD exacerbation Slow to improve. Continue nebulizer treatments, steroids. Also on budesonide nebulizations. Start tapering steroids from tomorrow.   History of alcohol abuse. No signs of withdrawal as yet. He has been counseled. Continue with CIWA protocol. Hasn't had alcoholic drink in a week. Monitor for signs of withdrawal.  Likely alcoholic liver disease CT scan showed cirrhotic changes in the liver. Most likely due to alcohol intake. LFTs are normal. He will need outpatient evaluation for same.  Microcytic anemia. Anemia panel reviewed. He appears to have some degree of iron deficiency. Vitamin B-12 level was 615. No overt bleeding noted. Stool for occult blood is  pending. Will need outpatient evaluation for same. Some drop in hemoglobin was noted, but stable. Most likely dilutional. No evidence for overt bleeding.   Hand numbness Ongoing for a few weeks. Does not have any focal neurological deficits. B-12 level is normal. TSH was normal earlier this month. Will need outpatient evaluation for neuropathy. CT scan of his head earlier this month, did not show any acute findings.  Abnormal-appearing sigmoid colon on CT scan. Patient's abdomen is benign. Could be due to colonic spasms as noted in the CT report. No clinical evidence for colitis. Continue to monitor clinically for now.  Hypokalemia  Continue to replete. Magnesium was 1.9.  DVT Prophylaxis: Lovenox    Code Status: Full code  Family Communication: Discussed with the patient  Disposition Plan: Continue management as outlined above. Mobilize.  LOS: 3 days   [facility] Pager [phone] 08/24/2016, 8:21 AM  If 7PM-7AM, please contact night-coverage at [email or web address], password [facility]
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
## 2016-08-24 NOTE — Progress Notes (Signed)
Pt refuses breathing treatments. Pt says breathing treatments make him cough more. Changed breathing treatments to prn per pt request

## 2016-08-25 MED ORDER — METHYLPREDNISOLONE SODIUM SUCC 125 MG IJ SOLR
60.0000 mg | Freq: Two times a day (BID) | INTRAMUSCULAR | Status: DC
  Administered 2016-08-25: 60 mg via INTRAVENOUS
  Filled 2016-08-25 (×2): qty 2

## 2016-08-25 MED ORDER — LEVOFLOXACIN 750 MG PO TABS
750.0000 mg | ORAL_TABLET | Freq: Every day | ORAL | Status: DC
  Administered 2016-08-25 – 2016-08-26 (×2): 750 mg via ORAL
  Filled 2016-08-25 (×2): qty 1

## 2016-08-25 MED ORDER — INFLUENZA VAC SPLIT QUAD 0.5 ML IM SUSY
0.5000 mL | PREFILLED_SYRINGE | INTRAMUSCULAR | Status: AC
  Administered 2016-08-26: 0.5 mL via INTRAMUSCULAR
  Filled 2016-08-25: qty 0.5

## 2016-08-25 NOTE — Progress Notes (Signed)
[facility] PROGRESS NOTE  Bash Eisenberg [id number] DOB: [birth year] DOA: 08/21/2016  PCP: Maren Reamer, MD  Brief History/Interval Summary: 61 year old Caucasian male with a has medical history of alcohol abuse, COPD, presented with cough, congestion, shortness of breath and chest pain. Patient had been having nausea, vomiting for the week prior to hospitalization. CT scan showed bronchiolitis with evidence for some consolidation as well. He was bringing up yellowish-green expectoration. Since he was recently hospitalized earlier this month, concern was for healthcare associated pneumonia.  Reason for Visit: Healthcare associated pneumonia  Consultants: None  Procedures: None  Antibiotics: Initially placed on azithromycin. Changed over to vancomycin, aztreonam and Levaquin. Vancomycin and aztreonam discontinued 1/26 On Levaquin alone from 1/26  Subjective/Interval History: Patient continues to feel better. Cough is improved. Wheezing is less than before.    ROS: Denies any nausea, vomiting.  Objective:  Vital Signs  Vitals:   08/25/16 0558 08/25/16 0559 08/25/16 0815 08/25/16 0929  BP: (!) 160/90 (!) 174/91    Pulse: 70     Resp: 18     Temp: 98.2 F (36.8 C)     TempSrc: Oral     SpO2: 95%  95% 94%  Weight: 62.1 kg (136 lb 14.4 oz)     Height:        Intake/Output Summary (Last 24 hours) at 08/25/16 0937 Last data filed at 08/25/16 0900  Gross per 24 hour  Intake             1390 ml  Output             3180 ml  Net            -1790 ml   Filed Weights   08/23/16 0548 08/24/16 0651 08/25/16 0558  Weight: 63.8 kg (140 lb 11.2 oz) 64.2 kg (141 lb 9.6 oz) 62.1 kg (136 lb 14.4 oz)    General appearance: alert, cooperative, appears stated age and no distress Resp: Normal effort. Not using accessory muscles. Continues to have some wheezing bilaterally, but much less compared to before. few crackles at the bases.  Cardio: regular rate and rhythm, S1,  S2 normal, no murmur, click, rub or gallop GI: soft, non-tender; bowel sounds normal; no masses,  no organomegaly Neurologic: Awake and alert. Oriented 3. No focal neurological deficits. Good strength bilateral upper and lower extremities.  Lab Results:  Data Reviewed: I have personally reviewed following labs and imaging studies  CBC:  Recent Labs Lab 08/21/16 1140 08/22/16 0158 08/23/16 0518 08/24/16 0409  WBC 11.7* 9.4 7.0 11.5*  NEUTROABS 9.5*  --   --   --   HGB 12.1* 10.6* 8.9* 8.9*  HCT 37.4* 32.9* 29.8* 29.4*  MCV 74.1* 75.1* 77.8* 76.8*  PLT 307 325 350 [id number]    Basic Metabolic Panel:  Recent Labs Lab 08/21/16 1140 08/22/16 0158 08/23/16 0518 08/24/16 0409  NA 132* 136 137 136  K 5.0 4.2 3.2* 3.3*  CL 96* 102 102 97*  CO2 25 23 27 30   GLUCOSE 116* 137* 152* 136*  BUN 6 5* 6 7  CREATININE 0.58* 0.51* 0.46* 0.51*  CALCIUM 8.8* 8.1* 7.9* 8.0*  MG  --   --   --  1.9    GFR: Estimated Creatinine Clearance: 81.2 mL/min (by C-G formula based on SCr of 0.51 mg/dL (L)).  Liver Function Tests:  Recent Labs Lab 08/21/16 1303 08/23/16 0518  AST 36 24  ALT 56 36  ALKPHOS 95 70  BILITOT 0.6  0.4  PROT 6.9 5.6*  ALBUMIN 2.9* 2.1*     Recent Labs Lab 08/21/16 1303  LIPASE 13     Radiology Studies: No results found.   Medications:  Scheduled: . budesonide (PULMICORT) nebulizer solution  0.25 mg Nebulization BID  . buPROPion  200 mg Oral QHS  . enoxaparin (LOVENOX) injection  40 mg Subcutaneous Q24H  . folic acid  1 mg Oral Daily  . gabapentin  100 mg Oral BID  . [START ON 08/26/2016] Influenza vac split quadrivalent PF  0.5 mL Intramuscular Tomorrow-1000  . levofloxacin  750 mg Oral QHS  . methylPREDNISolone (SOLU-MEDROL) injection  60 mg Intravenous Q12H  . metoprolol  50 mg Oral BID  . multivitamin with minerals  1 tablet Oral Daily  . nicotine  21 mg Transdermal Daily  . pantoprazole  40 mg Oral Daily  . thiamine  100 mg Oral Daily    Continuous: . sodium chloride 10 mL/hr at 08/24/16 0215   [id number] & mag hydroxide-simeth, benzonatate, ibuprofen, ipratropium, levalbuterol, levalbuterol, ondansetron **OR** ondansetron (ZOFRAN) IV, traZODone  Assessment/Plan:  Principal Problem:   HCAP (healthcare-associated pneumonia) Active Problems:   Acute respiratory failure with hypoxia (HCC)   Alcoholism /alcohol abuse (HCC)   COPD exacerbation (HCC)   Nausea vomiting and diarrhea   Bronchiolitis    Acute hypoxic respiratory failure Secondary to pneumonia as well as COPD. Seems to be slowly improving. Currently on room air and saturating well.   Healthcare associated pneumonia CT scan of the chest showed bronchiolitis, but there were some changes concerning for consolidation. Hence, patient was treated with broad-spectrum antibiotics. Cultures are all negative. Patient was transitioned to oral Levaquin. Seems to be doing well.   Pulmonary nodules He is also noted to have pulmonary nodules on CT scan. This may need outpatient monitoring.  Acute COPD exacerbation Patient was very slow to improve. However, he is now better. Start tapering down the steroids.   History of alcohol abuse. Stable. CIWA protocol. No signs of withdrawal. Counseled regarding his alcohol use.  Likely alcoholic liver disease CT scan showed cirrhotic changes in the liver. Most likely due to alcohol intake. LFTs are normal. He will need outpatient evaluation for same.  Microcytic anemia. Anemia panel reviewed. He appears to have some degree of iron deficiency. Vitamin B-12 level was 615. No overt bleeding noted. Stool for occult blood has not been done so far. Will need outpatient evaluation for same. Some drop in hemoglobin was noted, but stable. Most likely dilutional. No evidence for overt bleeding.   Hand numbness Ongoing for a few weeks. Seems to be better. Does not have any focal neurological deficits. B-12 level is normal. TSH was normal  earlier this month. Will need outpatient evaluation for neuropathy. CT scan of his head earlier this month, did not show any acute findings.  Abnormal-appearing sigmoid colon on CT scan. Patient's abdomen is benign. Could be due to colonic spasms as noted in the CT report. No clinical evidence for colitis. Continue to monitor clinically for now.  Hypokalemia  Continue to replete. Magnesium was 1.9. Recheck tomorrow.  DVT Prophylaxis: Lovenox    Code Status: Full code  Family Communication: Discussed with the patient  Disposition Plan: SNF recommended by physical therapy. Social worker consulted.    LOS: 4 days   [place] [facility] Pager [phone] 08/25/2016, 9:37 AM  If 7PM-7AM, please contact night-coverage at [email or web address], password [facility]

## 2016-08-25 NOTE — Progress Notes (Signed)
Talked to patient about breathing treatments that he refused (per respiratory therapy note). He agreed to continue breathing treatments, just not as often.

## 2016-08-25 NOTE — Progress Notes (Signed)
Notified MD about patients high blood pressure measurements. Will continue to monitor and adjust plan of care accordingly.

## 2016-08-25 NOTE — Progress Notes (Signed)
Pharmacy Antibiotic Note  Samuel Reyes is a 61 y.o. male admitted on 08/21/2016 with HCAP.  Pharmacy has been consulted for levaquin and vancomycin dosing, now narrowed to levofloxacin monotherapy. Cultures have shown no growth, MRSA PCR negative, tapering steroids. Renal function has remained stable.  Plan:  -Continue levofloxacin 750mg  PO q24h -Follow-up length of therapy and culture data  Height: 5\' 4"  (162.6 cm) Weight: 136 lb 14.4 oz (62.1 kg) (scaale a) IBW/kg (Calculated) : 59.2  Temp (24hrs), Avg:98.2 F (36.8 C), Min:98 F (36.7 C), Max:98.4 F (36.9 C)   Recent Labs Lab 08/21/16 1140 08/22/16 0158 08/23/16 0518 08/24/16 0409  WBC 11.7* 9.4 7.0 11.5*  CREATININE 0.58* 0.51* 0.46* 0.51*    Estimated Creatinine Clearance: 81.2 mL/min (by C-G formula based on SCr of 0.51 mg/dL (L)).    Allergies  Allergen Reactions  . Bee Venom Anaphylaxis  . Penicillins Rash    Has patient had a PCN reaction causing immediate rash, facial/tongue/throat swelling, SOB or lightheadedness with hypotension: {Yes Has patient had a PCN reaction causing severe rash involving mucus membranes or skin necrosis: NO Has patient had a PCN reaction that required hospitalization {Yes Has patient had a PCN reaction occurring within the last 10 years: NO If all of the above answers are "NO", then may proceed with Cephalosporin use.    Antimicrobials: Azactam 1/24>>1/26 LVQ 1/24>> Vanc 1/24>>1/26  Micro:  1/23 BCx: ngtd   Arrie Senate, PharmD PGY-1 Pharmacy Resident Pager: [phone] 08/25/2016

## 2016-08-25 NOTE — Progress Notes (Signed)
Pt stable.  Watching TV.   Karie Kirks, Therapist, sports.

## 2016-08-26 LAB — BASIC METABOLIC PANEL
Anion gap: 10 (ref 5–15)
BUN: 13 mg/dL (ref 6–20)
CALCIUM: 8.2 mg/dL — AB (ref 8.9–10.3)
CHLORIDE: 96 mmol/L — AB (ref 101–111)
CO2: 32 mmol/L (ref 22–32)
CREATININE: 0.63 mg/dL (ref 0.61–1.24)
Glucose, Bld: 144 mg/dL — ABNORMAL HIGH (ref 65–99)
Potassium: 3.3 mmol/L — ABNORMAL LOW (ref 3.5–5.1)
SODIUM: 138 mmol/L (ref 135–145)

## 2016-08-26 LAB — CULTURE, BLOOD (ROUTINE X 2)
CULTURE: NO GROWTH
Culture: NO GROWTH

## 2016-08-26 LAB — CBC
HCT: 34.5 % — ABNORMAL LOW (ref 39.0–52.0)
HEMOGLOBIN: 10.9 g/dL — AB (ref 13.0–17.0)
MCH: 23.7 pg — ABNORMAL LOW (ref 26.0–34.0)
MCHC: 31.6 g/dL (ref 30.0–36.0)
MCV: 75 fL — ABNORMAL LOW (ref 78.0–100.0)
Platelets: 468 10*3/uL — ABNORMAL HIGH (ref 150–400)
RBC: 4.6 MIL/uL (ref 4.22–5.81)
RDW: 21.4 % — ABNORMAL HIGH (ref 11.5–15.5)
WBC: 10.6 10*3/uL — ABNORMAL HIGH (ref 4.0–10.5)

## 2016-08-26 MED ORDER — PREDNISONE 50 MG PO TABS
60.0000 mg | ORAL_TABLET | Freq: Once | ORAL | Status: AC
  Administered 2016-08-26: 60 mg via ORAL
  Filled 2016-08-26: qty 1

## 2016-08-26 MED ORDER — PREDNISONE 10 MG PO TABS
60.0000 mg | ORAL_TABLET | Freq: Every day | ORAL | Status: DC
  Administered 2016-08-27: 60 mg via ORAL
  Filled 2016-08-26: qty 1

## 2016-08-26 MED ORDER — POTASSIUM CHLORIDE CRYS ER 20 MEQ PO TBCR
40.0000 meq | EXTENDED_RELEASE_TABLET | Freq: Once | ORAL | Status: AC
  Administered 2016-08-26: 40 meq via ORAL
  Filled 2016-08-26: qty 2

## 2016-08-26 NOTE — Progress Notes (Signed)
Patient complained of a little dizziness when standing up while getting weighed. Explained to him that he needs to start getting up more while taking his time and he agreed.

## 2016-08-26 NOTE — Progress Notes (Signed)
[facility] PROGRESS NOTE  Samuel Reyes [id number] DOB: April 24, 1956 DOA: 08/21/2016  PCP: Maren Reamer, MD  Brief History/Interval Summary: 61 year old Caucasian male with a has medical history of alcohol abuse, COPD, presented with cough, congestion, shortness of breath and chest pain. Patient had been having nausea, vomiting for the week prior to hospitalization. CT scan showed bronchiolitis with evidence for some consolidation as well. He was bringing up yellowish-green expectoration. Since he was recently hospitalized earlier this month, concern was for healthcare associated pneumonia.  Reason for Visit: Healthcare associated pneumonia  Consultants: None  Procedures: None  Antibiotics: Initially placed on azithromycin. Changed over to vancomycin, aztreonam and Levaquin. Vancomycin and aztreonam discontinued 1/26 On Levaquin alone from 1/26  Subjective/Interval History: Patient states that he feels tired. He quit a bit dizzy when he caught up yesterday. Otherwise, his shortness of breath has improved. Overall, he feels better.   ROS: Denies any nausea, vomiting.  Objective:  Vital Signs  Vitals:   08/25/16 2052 08/26/16 0243 08/26/16 0637 08/26/16 0725  BP:  (!) 122/92 (!) 157/88   Pulse:  72 77   Resp:   18   Temp:   98.8 F (37.1 C)   TempSrc:   Oral   SpO2: 95%  95% 93%  Weight:   60.3 kg (133 lb)   Height:        Intake/Output Summary (Last 24 hours) at 08/26/16 1036 Last data filed at 08/26/16 0600  Gross per 24 hour  Intake             1040 ml  Output             1300 ml  Net             -260 ml   Filed Weights   08/24/16 0651 08/25/16 0558 08/26/16 0637  Weight: 64.2 kg (141 lb 9.6 oz) 62.1 kg (136 lb 14.4 oz) 60.3 kg (133 lb)    General appearance: alert, cooperative, appears stated age and no distress Resp: Normal effort. Not using accessory muscles. Continues to have some wheezing bilaterally, but much less compared to before.  Improved air entry bilaterally. Cardio: regular rate and rhythm, S1, S2 normal, no murmur, click, rub or gallop GI: soft, non-tender; bowel sounds normal; no masses,  no organomegaly Neurologic: Awake and alert. Oriented 3. No focal neurological deficits. Good strength bilateral upper and lower extremities.  Lab Results:  Data Reviewed: I have personally reviewed following labs and imaging studies  CBC:  Recent Labs Lab 08/21/16 1140 08/22/16 0158 08/23/16 0518 08/24/16 0409 08/26/16 0451  WBC 11.7* 9.4 7.0 11.5* 10.6*  NEUTROABS 9.5*  --   --   --   --   HGB 12.1* 10.6* 8.9* 8.9* 10.9*  HCT 37.4* 32.9* 29.8* 29.4* 34.5*  MCV 74.1* 75.1* 77.8* 76.8* 75.0*  PLT 307 325 350 378 468*    Basic Metabolic Panel:  Recent Labs Lab 08/21/16 1140 08/22/16 0158 08/23/16 0518 08/24/16 0409 08/26/16 0451  NA 132* 136 137 136 138  K 5.0 4.2 3.2* 3.3* 3.3*  CL 96* 102 102 97* 96*  CO2 25 23 27 30  32  GLUCOSE 116* 137* 152* 136* 144*  BUN 6 5* 6 7 13   CREATININE 0.58* 0.51* 0.46* 0.51* 0.63  CALCIUM 8.8* 8.1* 7.9* 8.0* 8.2*  MG  --   --   --  1.9  --     GFR: Estimated Creatinine Clearance: 81.2 mL/min (by C-G formula based on SCr  of 0.63 mg/dL).  Liver Function Tests:  Recent Labs Lab 08/21/16 1303 08/23/16 0518  AST 36 24  ALT 56 36  ALKPHOS 95 70  BILITOT 0.6 0.4  PROT 6.9 5.6*  ALBUMIN 2.9* 2.1*     Recent Labs Lab 08/21/16 1303  LIPASE 13     Radiology Studies: No results found.   Medications:  Scheduled: . budesonide (PULMICORT) nebulizer solution  0.25 mg Nebulization BID  . buPROPion  200 mg Oral QHS  . enoxaparin (LOVENOX) injection  40 mg Subcutaneous Q24H  . folic acid  1 mg Oral Daily  . gabapentin  100 mg Oral BID  . Influenza vac split quadrivalent PF  0.5 mL Intramuscular Tomorrow-1000  . levofloxacin  750 mg Oral QHS  . metoprolol  50 mg Oral BID  . multivitamin with minerals  1 tablet Oral Daily  . nicotine  21 mg Transdermal  Daily  . pantoprazole  40 mg Oral Daily  . [START ON 08/27/2016] predniSONE  60 mg Oral Q breakfast  . predniSONE  60 mg Oral Once  . thiamine  100 mg Oral Daily   Continuous: . sodium chloride 10 mL/hr at 08/24/16 0215   [id number] & mag hydroxide-simeth, benzonatate, ibuprofen, ipratropium, levalbuterol, levalbuterol, ondansetron **OR** ondansetron (ZOFRAN) IV, traZODone  Assessment/Plan:  Principal Problem:   HCAP (healthcare-associated pneumonia) Active Problems:   Acute respiratory failure with hypoxia (HCC)   Alcoholism /alcohol abuse (HCC)   COPD exacerbation (HCC)   Nausea vomiting and diarrhea   Bronchiolitis    Acute hypoxic respiratory failure Secondary to pneumonia as well as COPD. Seems to be improving. Currently on room air and saturating well.   Healthcare associated pneumonia CT scan of the chest showed bronchiolitis, but there were some changes concerning for consolidation. Hence, patient was treated with broad-spectrum antibiotics. Cultures are all negative. Patient was transitioned to oral Levaquin. Seems to be doing well.   Pulmonary nodules He is also noted to have pulmonary nodules on CT scan. This may need outpatient monitoring.  Acute COPD exacerbation Patient was very slow to improve. However, he is now better. Start tapering down the steroids. Change to oral.  History of alcohol abuse. Stable. CIWA protocol. No signs of withdrawal. Counseled regarding his alcohol use.  Likely alcoholic liver disease CT scan showed cirrhotic changes in the liver. Most likely due to alcohol intake. LFTs are normal. He will need outpatient evaluation for same.  Microcytic anemia. Anemia panel reviewed. He appears to have some degree of iron deficiency. Vitamin B-12 level was 615. No overt bleeding noted. Stool for occult blood has not been done so far. Will need outpatient evaluation for same. Some drop in hemoglobin was noted, but stable. Most likely dilutional. No  evidence for overt bleeding.   Hand numbness Ongoing for a few weeks. Seems to be better. Does not have any focal neurological deficits. B-12 level is normal. TSH was normal earlier this month. Will need outpatient evaluation for neuropathy. CT scan of his head, earlier this month, did not show any acute findings.  Abnormal-appearing sigmoid colon on CT scan. Patient's abdomen is benign. Could be due to colonic spasms as noted in the CT report. No clinical evidence for colitis. Continue to monitor clinically for now.  Hypokalemia  Continue to replete. Magnesium was 1.9.   DVT Prophylaxis: Lovenox    Code Status: Full code  Family Communication: Discussed with the patient  Disposition Plan: SNF recommended by physical therapy. Social worker consulted. Anticipate discharge 1/29.  LOS: 5 days   [facility] Pager [phone] 08/26/2016, 10:36 AM  If 7PM-7AM, please contact night-coverage at [email or web address], password [facility]

## 2016-08-27 MED ORDER — PREDNISONE 20 MG PO TABS: ORAL_TABLET | ORAL | 0 refills | Status: DC

## 2016-08-27 MED ORDER — PREDNISONE 5 MG PO TABS: 5.0000 mg | ORAL_TABLET | Freq: Every day | ORAL | Status: DC

## 2016-08-27 MED ORDER — IPRATROPIUM-ALBUTEROL 0.5-2.5 (3) MG/3ML IN SOLN: RESPIRATORY_TRACT | 0 refills | Status: DC

## 2016-08-27 MED ORDER — LEVOFLOXACIN 750 MG PO TABS: 750.0000 mg | ORAL_TABLET | Freq: Every day | ORAL | 0 refills | Status: AC

## 2016-08-27 MED ORDER — ADULT MULTIVITAMIN W/MINERALS CH: 1.0000 | ORAL_TABLET | Freq: Every day | ORAL | 0 refills | Status: DC

## 2016-08-27 MED ORDER — FLUTICASONE PROPIONATE HFA 110 MCG/ACT IN AERO: 1.0000 | INHALATION_SPRAY | Freq: Two times a day (BID) | RESPIRATORY_TRACT | 1 refills | Status: DC

## 2016-08-27 MED ORDER — BENZONATATE 200 MG PO CAPS: 200.0000 mg | ORAL_CAPSULE | Freq: Three times a day (TID) | ORAL | 0 refills | Status: DC | PRN

## 2016-08-27 NOTE — Progress Notes (Addendum)
Patient lives at home with his room mates; patient stated that he will decease the amount of alcohol that he is consuming; he is requesting a rolling walker at discharge no other therapies; he stated that he does not need a nurses aide to assist him with his ADL's; RW ordered and requested - to be delivered to his room at discharge; Aneta Mins [phone]

## 2016-08-27 NOTE — Discharge Summary (Signed)
[facility]  Physician Discharge Summary   Patient ID: Samuel Reyes MRN: [id number] DOB/AGE: [birth year] 61 y.o.  Admit date: 08/21/2016 Discharge date: 08/27/2016  PCP: Maren Reamer, MD  DISCHARGE DIAGNOSES:  Principal Problem:   HCAP (healthcare-associated pneumonia) Active Problems:   Acute respiratory failure with hypoxia (HCC)   Alcoholism /alcohol abuse ([place])   COPD exacerbation (HCC)   Nausea vomiting and diarrhea   Bronchiolitis   RECOMMENDATIONS FOR OUTPATIENT FOLLOW UP: 1. Close outpatient follow-up with PCP 2. Outpatient monitoring of pulmonary nodules 3. Further workup/evaluation of liver cirrhosis 4. Outpatient workup for anemia   DISCHARGE CONDITION: fair  Diet recommendation: As before  Filed Weights   08/25/16 0558 08/26/16 0637 08/27/16 0433  Weight: 62.1 kg (136 lb 14.4 oz) 60.3 kg (133 lb) 60.3 kg (133 lb)    INITIAL HISTORY: 61 year old Caucasian male with a has medical history of alcohol abuse, COPD, presented with cough, congestion, shortness of breath and chest pain. Patient had been having nausea, vomiting for the week prior to hospitalization. CT scan showed bronchiolitis with evidence for some consolidation as well. He was bringing up yellowish-green expectoration. Since he was recently hospitalized earlier this month, concern was for healthcare associated pneumonia.   HOSPITAL COURSE:   Acute hypoxic respiratory failure Secondary to pneumonia as well as COPD. patient has significantly improved. Currently on room air and saturating well.   Healthcare associated pneumonia CT scan of the chest showed bronchiolitis, but there were some changes concerning for consolidation. Hence, patient was initially treated with broad-spectrum antibiotics. Cultures are all negative. Patient was transitioned to oral Levaquin. Seems to be doing well. Antibiotics for a few more days.  Pulmonary nodules He is also noted to have pulmonary nodules  on CT scan. This may need outpatient monitoring.  Acute COPD exacerbation Patient was very slow to improve. However, he is now better. Will be discharged on tapering doses of steroids.  History of alcohol abuse. He was placed on CIWA protocol. He did not have any symptoms or signs of withdrawal. Counseled regarding his alcohol use.  Likely alcoholic liver disease CT scan showed cirrhotic changes in the liver. Most likely due to alcohol intake. LFTs are normal. He will need outpatient evaluation for same.  Microcytic anemia. Anemia panel reviewed. He appears to have some degree of iron deficiency. Vitamin B-12 level was 615. No overt bleeding noted. Stool for occult blood has not been done so far. Will need outpatient evaluation for same. Some drop in hemoglobin was noted, but stable. Most likely dilutional. No evidence for overt bleeding.   Hand numbness Ongoing for a few weeks. Seems to be better. Does not have any focal neurological deficits. B-12 level is normal. TSH was normal earlier this month. CT scan of his head, earlier this month, did not show any acute findings. If symptoms recur, he may need workup for neuropathy.  Abnormal-appearing sigmoid colon on CT scan. Patient's abdomen is benign. Could be due to colonic spasms as noted in the CT report. No clinical evidence for colitis. Continue to monitor clinically for now.  Patient did complain of some dizziness when he would get up. However, per nursing staff. He had been walking to bathroom without any difficulty. Orthostatics were checked and there was some drop in his blood pressure without any elevation in heart rate. He's been told to get up slowly from a sitting or lying position. TED stockings have been ordered.  Overall stable. Plan was for him to go to skilled nursing  facility or short-term rehabilitation. However, due to financial reasons patient is unable to do this. Continue to medicate. He cannot even get home health.  Rolling walker has been arranged for him. Patient prefers to go home with his friends. Medically stable for discharge.    PERTINENT LABS:  The results of significant diagnostics from this hospitalization (including imaging, microbiology, ancillary and laboratory) are listed below for reference.    Microbiology: Recent Results (from the past 240 hour(s))  Blood culture (routine x 2)     Status: None   Collection Time: 08/21/16  1:00 PM  Result Value Ref Range Status   Specimen Description BLOOD LEFT ANTECUBITAL  Final   Special Requests BOTTLES DRAWN AEROBIC AND ANAEROBIC 10CC  Final   Culture NO GROWTH 5 DAYS  Final   Report Status 08/26/2016 FINAL  Final  Blood culture (routine x 2)     Status: None   Collection Time: 08/21/16  1:10 PM  Result Value Ref Range Status   Specimen Description BLOOD LEFT HAND  Final   Special Requests IN PEDIATRIC BOTTLE 4CC  Final   Culture NO GROWTH 5 DAYS  Final   Report Status 08/26/2016 FINAL  Final     Labs: Basic Metabolic Panel:  Recent Labs Lab 08/21/16 1140 08/22/16 0158 08/23/16 0518 08/24/16 0409 08/26/16 0451  NA 132* 136 137 136 138  K 5.0 4.2 3.2* 3.3* 3.3*  CL 96* 102 102 97* 96*  CO2 25 23 27 30  32  GLUCOSE 116* 137* 152* 136* 144*  BUN 6 5* 6 7 13   CREATININE 0.58* 0.51* 0.46* 0.51* 0.63  CALCIUM 8.8* 8.1* 7.9* 8.0* 8.2*  MG  --   --   --  1.9  --    Liver Function Tests:  Recent Labs Lab 08/21/16 1303 08/23/16 0518  AST 36 24  ALT 56 36  ALKPHOS 95 70  BILITOT 0.6 0.4  PROT 6.9 5.6*  ALBUMIN 2.9* 2.1*    Recent Labs Lab 08/21/16 1303  LIPASE 13   CBC:  Recent Labs Lab 08/21/16 1140 08/22/16 0158 08/23/16 0518 08/24/16 0409 08/26/16 0451  WBC 11.7* 9.4 7.0 11.5* 10.6*  NEUTROABS 9.5*  --   --   --   --   HGB 12.1* 10.6* 8.9* 8.9* 10.9*  HCT 37.4* 32.9* 29.8* 29.4* 34.5*  MCV 74.1* 75.1* 77.8* 76.8* 75.0*  PLT 307 325 350 378 468*    IMAGING STUDIES Dg Chest 2 View  Result Date:  08/21/2016 CLINICAL DATA:  Patient with cough, shortness of breath and fever. EXAM: CHEST  2 VIEW COMPARISON:  Chest radiograph 08/02/2016. FINDINGS: Stable cardiac and mediastinal contours. No consolidative pulmonary opacities. No pleural effusion or pneumothorax. Upper abdominal surgical clips. Thoracic spine degenerative changes. IMPRESSION: No active cardiopulmonary disease. Electronically Signed   By: Lovey Newcomer M.D.   On: 08/21/2016 12:28    Ct Angio Chest Pe W And/or Wo Contrast  Result Date: 08/21/2016 CLINICAL DATA:  Hypoxia.  Diffuse abdominal pain. EXAM: CT ANGIOGRAPHY CHEST CT ABDOMEN AND PELVIS WITH CONTRAST TECHNIQUE: Multidetector CT imaging of the chest was performed using the standard protocol during bolus administration of intravenous contrast. Multiplanar CT image reconstructions and MIPs were obtained to evaluate the vascular anatomy. Multidetector CT imaging of the abdomen and pelvis was performed using the standard protocol during bolus administration of intravenous contrast. CONTRAST:  100 cc Isovue 370 IV COMPARISON:  CT abdomen and pelvis 02/05/2015, CXR 08/02/2016 FINDINGS: CTA CHEST FINDINGS Cardiovascular: The study is  of quality for the evaluation of pulmonary embolism. There are no filling defects in the central, lobar, segmental or subsegmental pulmonary artery branches to suggest acute pulmonary embolism. Great vessels are normal in course and caliber. Normal heart size. No significant pericardial fluid/thickening. Coronary arteriosclerosis along the left main, lad and circumflex. Mediastinum/Nodes: No discrete thyroid nodules. Unremarkable esophagus. Surgical clips at the GE junction again noted. No pathologically enlarged axillary nor mediastinal nodes. Right hilar lymphadenopathy 1.5 cm short axis may be reactive in etiology. Lungs/Pleura: Bilateral tree-in-bud densities throughout both lungs consistent with bronchiolitis with minimal peribronchial thickening and  bronchiectasis especially in the upper lobes. Small nodular densities are seen in the right upper and lower lobes possibly representing pulmonary nodules versus inspissated mucus or areas of postinfectious or postinflammatory change, index nodules measuring 3 mm on images 43 in the right upper and two on image 76 in the right lower lobe measuring 3-4 mm each on the lung window series. No pleural effusion. Tiny pulmonary consolidation with air bronchogram in the right lower lobe possibly postinfectious or inflammatory, image 77 and abutting the posterior right lower lobe, image 33 are noted the this may represent a focus of atelectasis given its somewhat triangular appearance. Upper abdomen: See below Musculoskeletal:  No aggressive appearing focal osseous lesions. Review of the MIP images confirms the above findings. CT ABDOMEN and PELVIS FINDINGS Hepatobiliary: Hepatic steatosis. Unremarkable gallbladder. No biliary dilatation. Focal fat along the falciform ligament. Mild micronodular moderate appearance of the left hepatic lobe again noted, image 21, series 601. Pancreas: Unremarkable. No pancreatic ductal dilatation or surrounding inflammatory changes. Spleen: Normal in size without focal abnormality. Adrenals/Urinary Tract: Normal bilateral adrenal glands. Kidneys enhance appropriately with tiny lower pole 3 mm right and interpolar left hypodensities statistically consistent with cysts but too small to further characterize. Physiologic distention of the bladder. No obstructive uropathy or nephrolithiasis. Stomach/Bowel: Surgical clips are noted at the GE junction in the adjacent to the posterior cortex of the spleen. There is no bowel obstruction. A few minimally distended small bowel loops in the left hemiabdomen extending into the left hemipelvis containing a mild amount of fluid may represent an enteritis. Normal-appearing appendix. There is no large bowel obstruction. Segmental areas of transmural thickening  is seen involving the distal descending and sigmoid colon. This has the appearance of colonic spasm and underdistention. Colitis would be difficult to entirely exclude. Correlate. Vascular/Lymphatic: Aortic atherosclerosis. No enlarged abdominal or pelvic lymph nodes. Stable small perigastric varices. Reproductive: Central zone calcifications within the normal size prostate. Other: No abdominal wall hernia or abnormality. No abdominopelvic ascites. Musculoskeletal: Status post right inguinal hernia repair. Degenerative disc disease L1-2 and L5-S1. Review of the MIP images confirms the above findings. IMPRESSION: 1. No acute pulmonary embolus. 2. Diffuse tree-in-bud appearance of the lungs consistent with bronchiolitis. 3. Small nodular opacities measuring between 3 and 4 mm in the right upper and lower lobes cannot entirely exclude pulmonary nodules. No follow-up needed if patient is low-risk (and has no known or suspected primary neoplasm). Non-contrast chest CT can be considered in 12 months if patient is high-risk. This recommendation follows the consensus statement: Guidelines for Management of Incidental Pulmonary Nodules Detected on CT Images: From the Fleischner Society 2017; Radiology 2017; 284:228-243. 4. Tiny pulmonary consolidations in the right upper and lower lobes which may represent areas of atelectasis or possible areas of infection/inflammation. 5. Hepatic steatosis with stable suspected mild changes of cirrhosis. 6. Probable distal descending and sigmoid colonic spasm accounting for segmental  areas of luminal narrowing and mild thickening. A mild colitis is not entirely excluded. Similarly there are a few fluid-filled mildly distended small bowel loops in the left hemiabdomen and pelvis which may reflect small bowel enteritis. Electronically Signed   By: Ashley Royalty M.D.   On: 08/21/2016 19:02   Ct Abdomen Pelvis W Contrast  Result Date: 08/21/2016 CLINICAL DATA:  Hypoxia.  Diffuse abdominal  pain. EXAM: CT ANGIOGRAPHY CHEST CT ABDOMEN AND PELVIS WITH CONTRAST TECHNIQUE: Multidetector CT imaging of the chest was performed using the standard protocol during bolus administration of intravenous contrast. Multiplanar CT image reconstructions and MIPs were obtained to evaluate the vascular anatomy. Multidetector CT imaging of the abdomen and pelvis was performed using the standard protocol during bolus administration of intravenous contrast. CONTRAST:  100 cc Isovue 370 IV COMPARISON:  CT abdomen and pelvis 02/05/2015, CXR 08/02/2016 FINDINGS: CTA CHEST FINDINGS Cardiovascular: The study is of quality for the evaluation of pulmonary embolism. There are no filling defects in the central, lobar, segmental or subsegmental pulmonary artery branches to suggest acute pulmonary embolism. Great vessels are normal in course and caliber. Normal heart size. No significant pericardial fluid/thickening. Coronary arteriosclerosis along the left main, lad and circumflex. Mediastinum/Nodes: No discrete thyroid nodules. Unremarkable esophagus. Surgical clips at the GE junction again noted. No pathologically enlarged axillary nor mediastinal nodes. Right hilar lymphadenopathy 1.5 cm short axis may be reactive in etiology. Lungs/Pleura: Bilateral tree-in-bud densities throughout both lungs consistent with bronchiolitis with minimal peribronchial thickening and bronchiectasis especially in the upper lobes. Small nodular densities are seen in the right upper and lower lobes possibly representing pulmonary nodules versus inspissated mucus or areas of postinfectious or postinflammatory change, index nodules measuring 3 mm on images 43 in the right upper and two on image 76 in the right lower lobe measuring 3-4 mm each on the lung window series. No pleural effusion. Tiny pulmonary consolidation with air bronchogram in the right lower lobe possibly postinfectious or inflammatory, image 77 and abutting the posterior right lower lobe,  image 33 are noted the this may represent a focus of atelectasis given its somewhat triangular appearance. Upper abdomen: See below Musculoskeletal:  No aggressive appearing focal osseous lesions. Review of the MIP images confirms the above findings. CT ABDOMEN and PELVIS FINDINGS Hepatobiliary: Hepatic steatosis. Unremarkable gallbladder. No biliary dilatation. Focal fat along the falciform ligament. Mild micronodular moderate appearance of the left hepatic lobe again noted, image 21, series 601. Pancreas: Unremarkable. No pancreatic ductal dilatation or surrounding inflammatory changes. Spleen: Normal in size without focal abnormality. Adrenals/Urinary Tract: Normal bilateral adrenal glands. Kidneys enhance appropriately with tiny lower pole 3 mm right and interpolar left hypodensities statistically consistent with cysts but too small to further characterize. Physiologic distention of the bladder. No obstructive uropathy or nephrolithiasis. Stomach/Bowel: Surgical clips are noted at the GE junction in the adjacent to the posterior cortex of the spleen. There is no bowel obstruction. A few minimally distended small bowel loops in the left hemiabdomen extending into the left hemipelvis containing a mild amount of fluid may represent an enteritis. Normal-appearing appendix. There is no large bowel obstruction. Segmental areas of transmural thickening is seen involving the distal descending and sigmoid colon. This has the appearance of colonic spasm and underdistention. Colitis would be difficult to entirely exclude. Correlate. Vascular/Lymphatic: Aortic atherosclerosis. No enlarged abdominal or pelvic lymph nodes. Stable small perigastric varices. Reproductive: Central zone calcifications within the normal size prostate. Other: No abdominal wall hernia or abnormality. No abdominopelvic ascites.  Musculoskeletal: Status post right inguinal hernia repair. Degenerative disc disease L1-2 and L5-S1. Review of the MIP  images confirms the above findings. IMPRESSION: 1. No acute pulmonary embolus. 2. Diffuse tree-in-bud appearance of the lungs consistent with bronchiolitis. 3. Small nodular opacities measuring between 3 and 4 mm in the right upper and lower lobes cannot entirely exclude pulmonary nodules. No follow-up needed if patient is low-risk (and has no known or suspected primary neoplasm). Non-contrast chest CT can be considered in 12 months if patient is high-risk. This recommendation follows the consensus statement: Guidelines for Management of Incidental Pulmonary Nodules Detected on CT Images: From the Fleischner Society 2017; Radiology 2017; 284:228-243. 4. Tiny pulmonary consolidations in the right upper and lower lobes which may represent areas of atelectasis or possible areas of infection/inflammation. 5. Hepatic steatosis with stable suspected mild changes of cirrhosis. 6. Probable distal descending and sigmoid colonic spasm accounting for segmental areas of luminal narrowing and mild thickening. A mild colitis is not entirely excluded. Similarly there are a few fluid-filled mildly distended small bowel loops in the left hemiabdomen and pelvis which may reflect small bowel enteritis. Electronically Signed   By: Ashley Royalty M.D.   On: 08/21/2016 19:02    DISCHARGE EXAMINATION: Vitals:   08/27/16 0000 08/27/16 0430 08/27/16 0433 08/27/16 0935  BP: 121/87 97/71    Pulse: 73 80    Resp:  16    Temp:  98.5 F (36.9 C)    TempSrc:  Oral    SpO2:  96%  94%  Weight:   60.3 kg (133 lb)   Height:       General appearance: alert, cooperative, appears stated age and no distress Resp: Coarse breath sounds but no wheezing heard anymore. Cardio: regular rate and rhythm, S1, S2 normal, no murmur, click, rub or gallop GI: soft, non-tender; bowel sounds normal; no masses,  no organomegaly Extremities: extremities normal, atraumatic, no cyanosis or edema  DISPOSITION: Home  Discharge Instructions    Call MD  for:  difficulty breathing, headache or visual disturbances    Complete by:  As directed    Call MD for:  extreme fatigue    Complete by:  As directed    Call MD for:  persistant dizziness or light-headedness    Complete by:  As directed    Call MD for:  persistant nausea and vomiting    Complete by:  As directed    Call MD for:  severe uncontrolled pain    Complete by:  As directed    Call MD for:  temperature >100.4    Complete by:  As directed    Diet - low sodium heart healthy    Complete by:  As directed    Discharge instructions    Complete by:  As directed    Please get up slowly from a sitting or lying position to avoid feeling dizzy. Please wear your TED stockings. Take your medications as prescribed. Follow-up with primary care provider in a week.  You were cared for by a hospitalist during your hospital stay. If you have any questions about your discharge medications or the care you received while you were in the hospital after you are discharged, you can call the unit and asked to speak with the hospitalist on call if the hospitalist that took care of you is not available. Once you are discharged, your primary care physician will handle any further medical issues. Please note that NO REFILLS for any discharge medications will  be authorized once you are discharged, as it is imperative that you return to your primary care physician (or establish a relationship with a primary care physician if you do not have one) for your aftercare needs so that they can reassess your need for medications and monitor your lab values. If you do not have a primary care physician, you can call [phone] for a physician referral.   Increase activity slowly    Complete by:  As directed       ALLERGIES:  Allergies  Allergen Reactions  . Bee Venom Anaphylaxis  . Penicillins Rash    Has patient had a PCN reaction causing immediate rash, facial/tongue/throat swelling, SOB or lightheadedness with  hypotension: {Yes Has patient had a PCN reaction causing severe rash involving mucus membranes or skin necrosis: NO Has patient had a PCN reaction that required hospitalization {Yes Has patient had a PCN reaction occurring within the last 10 years: NO If all of the above answers are "NO", then may proceed with Cephalosporin use.      Discharge Medication List as of 08/27/2016 11:42 AM    START taking these medications   Details  benzonatate (TESSALON) 200 MG capsule Take 1 capsule (200 mg total) by mouth 3 (three) times daily as needed for cough., Starting Mon 08/27/2016, Print    fluticasone (FLOVENT HFA) 110 MCG/ACT inhaler Inhale 1 puff into the lungs 2 (two) times daily., Starting Mon 08/27/2016, Print    ipratropium-albuterol (DUONEB) 0.5-2.5 (3) MG/3ML SOLN Take 4 times daily as needed for wheezing, Print    levofloxacin (LEVAQUIN) 750 MG tablet Take 1 tablet (750 mg total) by mouth at bedtime., Starting Mon 08/27/2016, Until Wed 08/29/2016, Print    Multiple Vitamin (MULTIVITAMIN WITH MINERALS) TABS tablet Take 1 tablet by mouth daily., Starting Tue 08/28/2016, Print      CONTINUE these medications which have CHANGED   Details  predniSONE (DELTASONE) 20 MG tablet Take 3 tablets once daily for 4 days, then take 2 tablets once daily for 4 days, then take 1 tablet once daily for 4 days and then STOP., Print      CONTINUE these medications which have NOT CHANGED   Details  albuterol (PROVENTIL HFA;VENTOLIN HFA) 108 (90 Base) MCG/ACT inhaler Inhale 1-2 puffs into the lungs every 4 (four) hours as needed for wheezing., Starting 11/07/2015, Until Discontinued, No Print    buPROPion (WELLBUTRIN SR) 200 MG 12 hr tablet Take 1 tablet (200 mg total) by mouth at bedtime., Starting 12/13/2015, Until Discontinued, Normal    folic acid (FOLVITE) 1 MG tablet Take 1 tablet (1 mg total) by mouth daily., Starting 12/13/2015, Until Discontinued, No Print    gabapentin (NEURONTIN) 100 MG capsule Take 1  capsule (100 mg total) by mouth 3 (three) times daily. For agitation/alcohol withdrawal syndrome, Starting 12/13/2015, Until Discontinued, Normal    metoprolol (LOPRESSOR) 50 MG tablet Take 1 tablet (50 mg total) by mouth 2 (two) times daily., Starting Mon 08/06/2016, No Print    nicotine (NICODERM CQ - DOSED IN MG/24 HOURS) 21 mg/24hr patch Place 1 patch (21 mg total) onto the skin daily. For nicotine addiction, Starting 11/07/2015, Until Discontinued, Normal    pantoprazole (PROTONIX) 40 MG tablet Take 1 tablet (40 mg total) by mouth daily., Starting Tue 08/07/2016, No Print    thiamine 100 MG tablet Take 1 tablet (100 mg total) by mouth daily., Starting 12/13/2015, Until Discontinued, No Print    traZODone (DESYREL) 100 MG tablet Take 1 tablet (100 mg  total) by mouth at bedtime as needed for sleep., Starting 12/13/2015, Until Discontinued, Normal      STOP taking these medications     albuterol-ipratropium (COMBIVENT) 18-103 MCG/ACT inhaler          Follow-up [place] Follow up on 09/03/2016.   Why:  [facility] appointment on 09/03/16 at 2:00 pm with Dr. Jarold Song. Contact information: [place] [phone]          TOTAL DISCHARGE TIME: 25 minutes  [facility] Pager [phone]  08/27/2016, 4:26 PM

## 2016-08-27 NOTE — Progress Notes (Signed)
Physical Therapy Treatment Patient Details Name: Samuel Reyes MRN: [id number] DOB: 09-May-1956 Today's Date: 08/27/2016    History of Present Illness Pt adm with HCAP. PMH - etoh abuse, copd, depression    PT Comments    RN reports pt now wanting to d/c home. PT recommends ST SNF due to safety. He continues to report dizziness with all mobility. PT encouraged pt stay OOB. He declined sitting in recliner this session. Pt lives alone.Unsure if he is eligible for [facility] services due to insurance. If pt declines SNF, recommend maximizing HH services, if he is eligible.   Follow Up Recommendations  SNF     Equipment Recommendations  Rolling walker with 5" wheels    Recommendations for Other Services       Precautions / Restrictions Precautions Precautions: Fall Restrictions Weight Bearing Restrictions: No    Mobility  Bed Mobility         Supine to sit: Supervision Sit to supine: Supervision   General bed mobility comments: increased time, supervision for safety due to dizziness  Transfers   Equipment used: Rolling walker (2 wheeled)   Sit to Stand: Min guard Stand pivot transfers: Min assist       General transfer comment: assist for balance  Ambulation/Gait Ambulation/Gait assistance: Min assist Ambulation Distance (Feet): 150 Feet Assistive device: Rolling walker (2 wheeled) Gait Pattern/deviations: Step-through pattern;Drifts right/left Gait velocity: decreased Gait velocity interpretation: Below normal speed for age/gender General Gait Details: mildly unsteady. Assist to maneuver around obstacles in hallway. Ambulated on RA. O2 sats 91% prior to ambulation and 93% after.   Stairs            Wheelchair Mobility    Modified Rankin (Stroke Patients Only)       Balance   Sitting-balance support: No upper extremity supported;Feet supported Sitting balance-Leahy Scale: Good     Standing balance support: Bilateral upper extremity  supported;During functional activity Standing balance-Leahy Scale: Fair                      Cognition Arousal/Alertness: Awake/alert Behavior During Therapy: WFL for tasks assessed/performed Overall Cognitive Status: Within Functional Limits for tasks assessed                      Exercises      General Comments        Pertinent Vitals/Pain Pain Assessment: No/denies pain    Home Living                      Prior Function            PT Goals (current goals can now be found in the care plan section) Acute Rehab PT Goals Patient Stated Goal: home PT Goal Formulation: With patient Time For Goal Achievement: 08/30/16 Potential to Achieve Goals: Good Progress towards PT goals: Progressing toward goals    Frequency    Min 3X/week      PT Plan Current plan remains appropriate    Co-evaluation             End of Session Equipment Utilized During Treatment: Gait belt Activity Tolerance: Patient tolerated treatment well Patient left: in bed;with call bell/phone within reach     Time: 1112-1124 PT Time Calculation (min) (ACUTE ONLY): 12 min  Charges:  $Gait Training: 8-22 mins                    G Codes:  Lorriane Shire 08/27/2016, 11:34 AM

## 2016-08-27 NOTE — Clinical Social Work Note (Addendum)
CSW met with patient to discuss PT recommendation for SNF and Medicaid barriers: Patient would have to stay a minimum of 30 days for Medicaid to pay, therapies would be out of pocket, and his Medicaid check would go directly to the facility. Patient prefers to go home. RNCM notified.  CSW signing off. Consult again if any social work needs arise.   , CSW [phone]  

## 2016-08-27 NOTE — Hospital Discharge Follow-Up (Signed)
Transitional Care Clinic Care Coordination Note:  Admit date:  08/21/16 Discharge date: 08/27/16 Discharge Disposition: Home when stable Patient contact: [phone] (mobile) Emergency contact(s): none  This Case Manager reviewed patient's EMR and determined patient would benefit from post-discharge medical management and chronic care management services through the [facility]. Patient has a history of COPD, HTN, depression, stroke, ETOH abuse. Hospitalized for acute hypoxic respiratory failure, healthcare associated pneumonia. Patient has had two inpatient admissions in the last six months. This Case Manager met with patient to discuss the services and medical management that can be provided at the [facility]. Patient verbalized understanding and agreed to receive post-discharge care at the [facility].   Patient scheduled for Transitional Care appointment on 09/03/16 at 1400 with Dr. Jarold Song.  Clinic information and appointment time provided to patient. Appointment information also placed on AVS.  Assessment:       Home Environment: Patient lives in an apartment with two roommates.        Support System: roommates       Level of functioning: Independent       Home DME: none but patient will be given a rolling walker prior to discharge       Home care services: none       Transportation: Patient states his stepfather takes him to his medical appointments. Patient denies problems with transportation.         Food/Nutrition: Patient receives $155 of Food Stamps/month. Patient indicates he has access to needed food.        Medications: Patient gets his medications from [facility] on [address]. Patient denies problems affording or obtaining needed medications.        Identified Barriers: ETOH abuse-Patient may benefit from speaking with Education officer, museum at [facility] appointment.        PCP: Dr. Charlot[facility]             Arranged services:        Services communicated to Olga Coaster, RN CM

## 2016-08-27 NOTE — Progress Notes (Signed)
PT Cancellation Note  Patient Details Name: Samuel Reyes MRN: [id number] DOB: 1956/03/21   Cancelled Treatment:    Reason Eval/Treat Not Completed: Medical issues which prohibited therapy. Pt declining participation in PT due to c/o dizziness. He reports having just returned to bed after eating breakfast in chair. PT to re-attempt as time allows.   Lorriane Shire 08/27/2016, 9:17 AM

## 2016-08-27 NOTE — Progress Notes (Signed)
Occupational Therapy Treatment Patient Details Name: Samuel Reyes MRN: [id number] DOB: 08-07-1955 Today's Date: 08/27/2016    History of present illness Pt adm with HCAP. PMH - etoh abuse, copd, depression   OT comments  Pt preparing to d/c home. Dressed with set up and supervision, performed standing toileting and grooming with supervision for safety and educated pt in energy conservation and pacing.   Follow Up Recommendations  Home health OT    Equipment Recommendations  None recommended by OT    Recommendations for Other Services      Precautions / Restrictions Precautions Precautions: Fall       Mobility Bed Mobility         Supine to sit: Supervision    General bed mobility comments: increased time  Transfers Overall transfer level: Needs assistance Equipment used: Rolling walker (2 wheeled) Transfers: Sit to/from Stand Sit to Stand: Supervision Stand pivot transfers: Min assist       General transfer comment: supervision for safety    Balance   Sitting-balance support: No upper extremity supported;Feet supported Sitting balance-Leahy Scale: Good     Standing balance support: Bilateral upper extremity supported;During functional activity Standing balance-Leahy Scale: Fair                     ADL Overall ADL's : Needs assistance/impaired     Grooming: Wash/dry hands;Standing;Supervision/safety           Upper Body Dressing : Set up;Sitting Upper Body Dressing Details (indicate cue type and reason): provided pt with disposable shirt Lower Body Dressing: Supervision/safety;Sit to/from stand Lower Body Dressing Details (indicate cue type and reason): with increased time, donned street clothing Toilet Transfer: Supervision/safety;RW;Ambulation Toilet Transfer Details (indicate cue type and reason): stood to urinate Toileting- Water quality scientist and Hygiene: Supervision/safety       Functional mobility during ADLs:  Supervision/safety;Rolling walker General ADL Comments: Educated in energy conservation and breathing techniques.      Vision                     Perception     Praxis      Cognition   Behavior During Therapy: WFL for tasks assessed/performed Overall Cognitive Status: Within Functional Limits for tasks assessed                       Extremity/Trunk Assessment               Exercises     Shoulder Instructions       General Comments      Pertinent Vitals/ Pain       Pain Assessment: No/denies pain  Home Living                                          Prior Functioning/Environment              Frequency  Min 2X/week        Progress Toward Goals  OT Goals(current goals can now be found in the care plan section)  Progress towards OT goals: Progressing toward goals  Acute Rehab OT Goals Patient Stated Goal: home Time For Goal Achievement: 09/07/16 Potential to Achieve Goals: Good  Plan Discharge plan remains appropriate    Co-evaluation                 End of Session  Equipment Utilized During Treatment: Rolling walker   Activity Tolerance Patient tolerated treatment well   Patient Left in chair;with call bell/phone within reach   Nurse Communication          Time: [id number] OT Time Calculation (min): 14 min  Charges: OT General Charges $OT Visit: 1 Procedure OT Treatments $Self Care/Home Management : 8-22 mins  Malka So 08/27/2016, 1:50 PM [phone]

## 2016-08-27 NOTE — Discharge Instructions (Signed)
Orthostatic Hypotension Orthostatic hypotension is a sudden drop in blood pressure that happens when you quickly change positions, such as when you get up from a seated or lying position. Blood pressure is a measurement of how strongly, or weakly, your blood is pressing against the walls of your arteries. Arteries are blood vessels that carry blood from your heart throughout your body. When blood pressure is too low, you may not get enough blood to your brain or to the rest of your organs. This can cause weakness, light-headedness, rapid heartbeat, and fainting. This can last for just a few seconds or for up to a few minutes. Orthostatic hypotension is usually not a serious problem. However, if it happens frequently or gets worse, it may be a sign of something more serious. What are the causes? This condition may be caused by:  Sudden changes in posture, such as standing up quickly after you have been sitting or lying down.  Blood loss.  Loss of body fluids (dehydration).  Heart problems.  Hormone (endocrine) problems.  Pregnancy.  Severe infection.  Lack of certain nutrients.  Severe allergic reactions (anaphylaxis).  Certain medicines, such as blood pressure medicine or medicines that make the body lose excess fluids (diuretics). Sometimes, this condition can be caused by not taking medicine as directed, such as taking too much of a certain medicine. What increases the risk? Certain factors can make you more likely to develop orthostatic hypotension, including:  Age. Risk increases as you get older.  Conditions that affect the heart or the central nervous system.  Taking certain medicines, such as blood pressure medicine or diuretics.  Being pregnant. What are the signs or symptoms? Symptoms of this condition may include:  Weakness.  Light-headedness.  Dizziness.  Blurred vision.  Fatigue.  Rapid heartbeat.  Fainting, in severe cases. How is this diagnosed? This  condition is diagnosed based on:  Your medical history.  Your symptoms.  Your blood pressure measurement. Your health care provider will check your blood pressure when you are:  Lying down.  Sitting.  Standing. A blood pressure reading is recorded as two numbers, such as "120 over 80" (or 120/80). The first ("top") number is called the systolic pressure. It is a measure of the pressure in your arteries as your heart beats. The second ("bottom") number is called the diastolic pressure. It is a measure of the pressure in your arteries when your heart relaxes between beats. Blood pressure is measured in a unit called mm Hg. Healthy blood pressure for adults is 120/80. If your blood pressure is below 90/60, you may be diagnosed with hypotension. Other information or tests that may be used to diagnose orthostatic hypotension include:  Your other vital signs, such as your heart rate and temperature.  Blood tests.  Tilt table test. For this test, you will be safely secured to a table that moves you from a lying position to an upright position. Your heart rhythm and blood pressure will be monitored during the test. How is this treated? Treatment for this condition may include:  Changing your diet. This may involve eating more salt (sodium) or drinking more water.  Taking medicines to raise your blood pressure.  Changing the dosage of certain medicines you are taking that might be lowering your blood pressure.  Wearing compression stockings. These stockings help to prevent blood clots and reduce swelling in your legs. In some cases, you may need to go to the hospital for:  Fluid replacement. This means you will  receive fluids through an IV tube.  Blood replacement. This means you will receive donated blood through an IV tube (transfusion).  Treating an infection or heart problems, if this applies.  Monitoring. You may need to be monitored while medicines that you are taking wear  off. Follow these instructions at home: Eating and drinking  Drink enough fluid to keep your urine clear or pale yellow.  Eat a healthy diet and follow instructions from your health care provider about eating or drinking restrictions. A healthy diet includes:  Fresh fruits and vegetables.  Whole grains.  Lean meats.  Low-fat dairy products.  Eat extra salt only as directed. Do not add extra salt to your diet unless your health care provider told you to do that.  Eat frequent, small meals.  Avoid standing up suddenly after eating. Medicines  Take over-the-counter and prescription medicines only as told by your health care provider.  Follow instructions from your health care provider about changing the dosage of your current medicines, if this applies.  Do not stop or adjust any of your medicines on your own. General instructions  Wear compression stockings as told by your health care provider.  Get up slowly from lying down or sitting positions. This gives your blood pressure a chance to adjust.  Avoid hot showers and excessive heat as directed by your health care provider.  Return to your normal activities as told by your health care provider. Ask your health care provider what activities are safe for you.  Do not use any products that contain nicotine or tobacco, such as cigarettes and e-cigarettes. If you need help quitting, ask your health care provider.  Keep all follow-up visits as told by your health care provider. This is important. Contact a health care provider if:  You vomit.  You have diarrhea.  You have a fever for more than 2-3 days.  You feel more thirsty than usual.  You feel weak and tired. Get help right away if:  You have chest pain.  You have a fast or irregular heartbeat.  You develop numbness in any part of your body.  You cannot move your arms or your legs.  You have trouble speaking.  You become sweaty or feel lightheaded.  You  faint.  You feel short of breath.  You have trouble staying awake.  You feel confused. This information is not intended to replace advice given to you by your health care provider. Make sure you discuss any questions you have with your health care provider. Document Released: 07/06/2002 Document Revised: 04/03/2016 Document Reviewed: 01/06/2016 Elsevier Interactive Patient Education  2017 Elsevier Inc.   Chronic Obstructive Pulmonary Disease Chronic obstructive pulmonary disease (COPD) is a common lung problem. In COPD, the flow of air from the lungs is limited. The way your lungs work will probably never return to normal, but there are things you can do to improve your lungs and make yourself feel better. Your doctor may treat your condition with:  Medicines.  Oxygen.  Lung surgery.  Changes to your diet.  Rehabilitation. This may involve a team of specialists. Follow these instructions at home:  Take all medicines as told by your doctor.  Avoid medicines or cough syrups that dry up your airway (such as antihistamines) and do not allow you to get rid of thick spit. You do not need to avoid them if told differently by your doctor.  If you smoke, stop. Smoking makes the problem worse.  Avoid being around things  that make your breathing worse (like smoke, chemicals, and fumes).  Use oxygen therapy and therapy to help improve your lungs (pulmonary rehabilitation) if told by your doctor. If you need home oxygen therapy, ask your doctor if you should buy a tool to measure your oxygen level (oximeter).  Avoid people who have a sickness you can catch (contagious).  Avoid going outside when it is very hot, cold, or humid.  Eat healthy foods. Eat smaller meals more often. Rest before meals.  Stay active, but remember to also rest.  Make sure to get all the shots (vaccines) your doctor recommends. Ask your doctor if you need a pneumonia shot.  Learn and use tips on how to  relax.  Learn and use tips on how to control your breathing as told by your doctor. Try: 1. Breathing in (inhaling) through your nose for 1 second. Then, pucker your lips and breath out (exhale) through your lips for 2 seconds. 2. Putting one hand on your belly (abdomen). Breathe in slowly through your nose for 1 second. Your hand on your belly should move out. Pucker your lips and breathe out slowly through your lips. Your hand on your belly should move in as you breathe out.  Learn and use controlled coughing to clear thick spit from your lungs. The steps are: 1. Lean your head a little forward. 2. Breathe in deeply. 3. Try to hold your breath for 3 seconds. 4. Keep your mouth slightly open while coughing 2 times. 5. Spit any thick spit out into a tissue. 6. Rest and do the steps again 1 or 2 times as needed. Contact a doctor if:  You cough up more thick spit than usual.  There is a change in the color or thickness of the spit.  It is harder to breathe than usual.  Your breathing is faster than usual. Get help right away if:  You have shortness of breath while resting.  You have shortness of breath that stops you from:  Being able to talk.  Doing normal activities.  You chest hurts for longer than 5 minutes.  Your skin color is more blue than usual.  Your pulse oximeter shows that you have low oxygen for longer than 5 minutes. This information is not intended to replace advice given to you by your health care provider. Make sure you discuss any questions you have with your health care provider. Document Released: 01/02/2008 Document Revised: 12/22/2015 Document Reviewed: 03/12/2013 Elsevier Interactive Patient Education  2017 [employer].

## 2016-08-27 NOTE — Progress Notes (Signed)
Patient provided discharge instructions. Medication list discussed including new medications and prescriptions. Follow up appointment in place. Patient verbalizes instructions of all discharge instructions.

## 2016-08-28 ENCOUNTER — Telehealth: Payer: Self-pay

## 2016-08-28 NOTE — Telephone Encounter (Signed)
Transitional Care Clinic Post-discharge Follow-Up Phone Call:  Date of Discharge: 08/27/16 Principal Discharge Diagnosis(es): Healthcare associated pneumonia, acute respiratory failure with hypoxia, COPD exacerbation Post-discharge Communication: Attempt #1 to reach patient and complete post-discharge follow-up phone call. Call placed to patient at [phone]; however, unable to reach patient. HIPPA compliant voicemail left requesting return call. Call completed: No

## 2016-08-29 ENCOUNTER — Telehealth: Payer: Self-pay

## 2016-08-29 MED ORDER — ALBUTEROL SULFATE HFA 108 (90 BASE) MCG/ACT IN AERS: 1.0000 | INHALATION_SPRAY | RESPIRATORY_TRACT | 1 refills | Status: DC | PRN

## 2016-08-29 MED ORDER — BUPROPION HCL ER (SR) 200 MG PO TB12: 200.0000 mg | ORAL_TABLET | Freq: Every day | ORAL | 0 refills | Status: DC

## 2016-08-29 NOTE — Telephone Encounter (Signed)
Transitional Care Clinic Post-discharge Follow-Up Phone Call:  Date of Discharge: 08/27/16 Principal Discharge Diagnosis(es): HCAP, acute respiratory failure with hypoxia, COPD exacerbation Call Completed: Yes                    With Whom: Patient   Please check all that apply:  X  Patient is knowledgeable of his/her condition(s) and/or treatment. X  Patient is caring for self at home.  ? Patient is receiving assist at home from family and/or caregiver. Family and/or caregiver is knowledgeable of patient's condition(s) and/or treatment. ? Patient is receiving home health services. If so, name of agency.     Medication Reconciliation:  X  Medication list reviewed with patient. X  Patient obtained all discharge medications-NO. Patient indicated he does not get paid until Friday so he has not picked up his discharge medications. Patient also indicated he is out of all of his chronic daily medications (trazodone, thiamine, pantoprazole, nicotine patches, gabapentin, folic acid, bupropion, albuterol) as well. Patient has scripts for his discharge medications. Stressed with patient the importance of picking up his discharge medications today. Spoke with Pharmacy Tech at [facility] who indicated pharmacy would be able to fill patient's medications today even if patient unable to afford cost of medications. Patient updated and again stressed to patient he needs to come to pharmacy at [facility] for his medications today. Patient verbalized understanding. Patient discharged with Duoneb solution (every 4 hours as needed for wheezing). Patient indicated he does not have a nebulizer at home and was not given one prior to discharge.   Spoke with Dr. Jarold Song who wrote order for a nebulizer for patient. Script for nebulizer faxed to [facility] [phone]). Also informed her that patient needing refills of chronic medications. She gave verbal order for  albuterol inhaler and wellbutrin to be refilled. Verbal orders entered. Call placed to patient to provide update; however, unable to reach patient. HIPPA compliant voicemail left requesting return call.   Activities of Daily Living:  X  Independent ? Needs assist ? Total Care    Community resources in place for patient:  X  [facility] [employer] [facility]-This Case Freight forwarder received call from Heath Lark with [place] that they follow patient in the community. ? Home Health/Home DME ? Assisted Living ? Support Group         Questions/Concerns discussed: This Case Manager inquired about patient's status. Patient indicated he was doing "better." Denied chest pain but continues to complain of shortness of breath. Patient denies orthopnea, fever, or coughing. Patient able to speak in complete sentences but indicated he is wheezing. Patient has not picked up any discharge medications and is out of all chronic medications as well. Stressed with patient importance of picking up all discharge medications today and taking them as prescribed. Refills needed of chronic medications. Stressed to patient that if shortness of breath worsens, if patient unable to catch breath, if he has severe wheezing or inability to speak due to shortness of breath that he needs to call EMS and get to ED immediately. Patient verbalized understanding and indicated he did not need to go to ED at this time. Informed Dr. Jarold Song of patient's continued shortness of breath and wheezing. She indicated patient needs to pick up his discharge medications and take them as prescribed. Patient has indicated he will pick up discharge medications today from [facility] and [facility].   Reminded patient  of his Transitional Care Clinic appointment on 09/03/16 at 1400 with Dr. Jarold Song. Patient aware of appointment. No additional needs identified.

## 2016-08-29 NOTE — Telephone Encounter (Signed)
This Case Manager placed call to [facility] to determine if they received nebulizer order. Spoke with Sharl Ma who indicated order received and processed. She indicated they have left a message for patient to determine if patient prefers to have nebulizer shipped to him or if he would rather pick it up from the retail store. She indicated [facility] is just waiting to hear back from patient. No additional needs identified.

## 2016-08-30 ENCOUNTER — Telehealth: Payer: Self-pay

## 2016-08-30 NOTE — Telephone Encounter (Signed)
Attempted to contact the patient to check on his status and to inquire if he has picked up all of his medications. Calls placed to [phone] (H) [phone] (M) and HIPAA compliant voicemail messages were left at each number requesting a call back to # [phone] or [phone].

## 2016-08-31 ENCOUNTER — Telehealth: Payer: Self-pay

## 2016-08-31 NOTE — Telephone Encounter (Signed)
Call placed to the patient and confirmed his appointment at the [place] at [facility] on 09/03/16 @ 1400. He also confirmed that he has transportation to the clinic. He stated that he is " doing good" and was at the pharmacy when this CM called. He said that he was picking up his medications and had just picked up his nebulizer.  No questions/concerns reported at this time.

## 2016-09-03 ENCOUNTER — Ambulatory Visit: Payer: Medicaid Other | Attending: Family Medicine | Admitting: Family Medicine

## 2016-09-03 ENCOUNTER — Encounter: Payer: Self-pay | Admitting: Licensed Clinical Social Worker

## 2016-09-03 ENCOUNTER — Encounter: Payer: Self-pay | Admitting: Family Medicine

## 2016-09-03 VITALS — BP 112/74 | HR 122 | Temp 97.6°F | Ht 64.0 in | Wt 138.0 lb

## 2016-09-03 DIAGNOSIS — Z9889 Other specified postprocedural states: Secondary | ICD-10-CM | POA: Diagnosis not present

## 2016-09-03 DIAGNOSIS — I10 Essential (primary) hypertension: Secondary | ICD-10-CM | POA: Diagnosis not present

## 2016-09-03 DIAGNOSIS — K219 Gastro-esophageal reflux disease without esophagitis: Secondary | ICD-10-CM | POA: Diagnosis not present

## 2016-09-03 DIAGNOSIS — G4709 Other insomnia: Secondary | ICD-10-CM

## 2016-09-03 DIAGNOSIS — Z8673 Personal history of transient ischemic attack (TIA), and cerebral infarction without residual deficits: Secondary | ICD-10-CM | POA: Diagnosis not present

## 2016-09-03 DIAGNOSIS — F17210 Nicotine dependence, cigarettes, uncomplicated: Secondary | ICD-10-CM | POA: Diagnosis not present

## 2016-09-03 DIAGNOSIS — R0602 Shortness of breath: Secondary | ICD-10-CM | POA: Insufficient documentation

## 2016-09-03 DIAGNOSIS — J449 Chronic obstructive pulmonary disease, unspecified: Secondary | ICD-10-CM | POA: Diagnosis not present

## 2016-09-03 DIAGNOSIS — Z888 Allergy status to other drugs, medicaments and biological substances status: Secondary | ICD-10-CM | POA: Insufficient documentation

## 2016-09-03 DIAGNOSIS — G4700 Insomnia, unspecified: Secondary | ICD-10-CM | POA: Insufficient documentation

## 2016-09-03 DIAGNOSIS — R911 Solitary pulmonary nodule: Secondary | ICD-10-CM | POA: Insufficient documentation

## 2016-09-03 DIAGNOSIS — I471 Supraventricular tachycardia: Secondary | ICD-10-CM | POA: Insufficient documentation

## 2016-09-03 DIAGNOSIS — R918 Other nonspecific abnormal finding of lung field: Secondary | ICD-10-CM | POA: Insufficient documentation

## 2016-09-03 DIAGNOSIS — Z720 Tobacco use: Secondary | ICD-10-CM | POA: Insufficient documentation

## 2016-09-03 DIAGNOSIS — Z79899 Other long term (current) drug therapy: Secondary | ICD-10-CM | POA: Insufficient documentation

## 2016-09-03 MED ORDER — PANTOPRAZOLE SODIUM 40 MG PO TBEC: 40.0000 mg | DELAYED_RELEASE_TABLET | Freq: Every day | ORAL | 1 refills | Status: DC

## 2016-09-03 MED ORDER — TRAZODONE HCL 100 MG PO TABS: 100.0000 mg | ORAL_TABLET | Freq: Every evening | ORAL | 1 refills | Status: DC | PRN

## 2016-09-03 MED ORDER — METOPROLOL TARTRATE 50 MG PO TABS: 50.0000 mg | ORAL_TABLET | Freq: Two times a day (BID) | ORAL | 1 refills | Status: DC

## 2016-09-03 MED ORDER — FLUTICASONE PROPIONATE HFA 110 MCG/ACT IN AERO: 1.0000 | INHALATION_SPRAY | Freq: Two times a day (BID) | RESPIRATORY_TRACT | 1 refills | Status: DC

## 2016-09-03 NOTE — Progress Notes (Signed)
Medication refills- trazodone, Metoprolol (hasn't taken in over a month)

## 2016-09-03 NOTE — Progress Notes (Signed)
Transitional Care Clinic  Date of telephone encounter: 08/28/16  Hospitalization dates: 08/21/16 through 08/27/16  PCP: Dr Janne Napoleon.  Subjective:  Patient ID: Samuel Reyes, male    DOB: [birth year]  Age: 61 y.o. MRN: [id number]  CC: Hospitalization Follow-up; COPD; Hypertension; and Insomnia   HPI Samuel Reyes is a 61 year old male with a history of COPD, tobacco abuse, hypertension, insomnia presents post hospitalization for Bronchiolitis/COPD exacerbation/healthcare associated pneumonia.  He had presented with worsening shortness of breath and there was a concern for healthcare associated pneumonia given recent previous hospitalization. His chest CT abdomen and pelvis revealed a tree-in-bud appearance of the lungs consistent with bronchiolitis, nodular opacities in keeping with pulmonary nodules, tiny pulmonary consolidation in right upper and lower lobes. Cultures were negative.  He was treated with broad-spectrum antibiotics which was later transitioned to oral Levaquin and prednisone taper on which he was discharged.  He informs me he is done with his prednisone taper even though med list indicates this was supposed to be tapered over 12 days and today is day 7. He also informs me he was advised to come off all his inhalers as he does not need them. Continues to have some shortness of breath and smokes half a pack of cigarettes per day (down from 3 packs per day and he has smoked for 50 years)  Past Medical History:  Diagnosis Date  . COPD (chronic obstructive pulmonary disease) ([place])   . Depression   . GERD (gastroesophageal reflux disease)   . Headache   . Hypertension   . Stroke [facility])     Past Surgical History:  Procedure Laterality Date  . BACK SURGERY     3 cervical spine surgeries C4-C5 fused  . COLONOSCOPY N/A 01/04/2014   Procedure: COLONOSCOPY;  Surgeon: Danie Binder, MD;  Location: [facility];  Service: Endoscopy;  Laterality: N/A;  1:45  .  ESOPHAGOGASTRODUODENOSCOPY N/A 01/04/2014   Procedure: ESOPHAGOGASTRODUODENOSCOPY (EGD);  Surgeon: Danie Binder, MD;  Location: [facility];  Service: Endoscopy;  Laterality: N/A;  . FINGER SURGERY Left    2nd, 3rd, & 4th fingers were cut off by table saw and reattached  . GASTRECTOMY    . HERNIA REPAIR    . INCISIONAL HERNIA REPAIR N/A 01/20/2014   Procedure: LAPAROSCOPIC RECURRENT  INCISIONAL HERNIA with mesh;  Surgeon: Edward Jolly, MD;  Location: [facility] ORS;  Service: General;  Laterality: N/A;  . rt knee arthroscopic surgery    . SHOULDER SURGERY Bilateral    3 surgeries on on left, 2 surgeries on right     Allergies  Allergen Reactions  . Bee Venom Anaphylaxis  . Penicillins Rash    Has patient had a PCN reaction causing immediate rash, facial/tongue/throat swelling, SOB or lightheadedness with hypotension: {Yes Has patient had a PCN reaction causing severe rash involving mucus membranes or skin necrosis: NO Has patient had a PCN reaction that required hospitalization {Yes Has patient had a PCN reaction occurring within the last 10 years: NO If all of the above answers are "NO", then may proceed with Cephalosporin use.      Outpatient Medications Prior to Visit  Medication Sig Dispense Refill  . benzonatate (TESSALON) 200 MG capsule Take 1 capsule (200 mg total) by mouth 3 (three) times daily as needed for cough. 20 capsule 0  . ipratropium-albuterol (DUONEB) 0.5-2.5 (3) MG/3ML SOLN Take 4 times daily as needed for wheezing 360 mL 0  . Multiple Vitamin (MULTIVITAMIN WITH MINERALS) TABS tablet Take  1 tablet by mouth daily. 30 tablet 0  . predniSONE (DELTASONE) 20 MG tablet Take 3 tablets once daily for 4 days, then take 2 tablets once daily for 4 days, then take 1 tablet once daily for 4 days and then STOP. 24 tablet 0  . albuterol (PROVENTIL HFA;VENTOLIN HFA) 108 (90 Base) MCG/ACT inhaler Inhale 1-2 puffs into the lungs every 4 (four) hours as needed for wheezing. (Patient  not taking: Reported on 09/03/2016) 1 Inhaler 1  . buPROPion (WELLBUTRIN SR) 200 MG 12 hr tablet Take 1 tablet (200 mg total) by mouth at bedtime. (Patient not taking: Reported on 09/03/2016) 30 tablet 0  . folic acid (FOLVITE) 1 MG tablet Take 1 tablet (1 mg total) by mouth daily. (Patient not taking: Reported on 09/03/2016) 30 tablet 2  . gabapentin (NEURONTIN) 100 MG capsule Take 1 capsule (100 mg total) by mouth 3 (three) times daily. For agitation/alcohol withdrawal syndrome (Patient not taking: Reported on 09/03/2016) 90 capsule 0  . nicotine (NICODERM CQ - DOSED IN MG/24 HOURS) 21 mg/24hr patch Place 1 patch (21 mg total) onto the skin daily. For nicotine addiction (Patient not taking: Reported on 09/03/2016) 28 patch 0  . thiamine 100 MG tablet Take 1 tablet (100 mg total) by mouth daily. (Patient not taking: Reported on 09/03/2016) 30 tablet 2  . fluticasone (FLOVENT HFA) 110 MCG/ACT inhaler Inhale 1 puff into the lungs 2 (two) times daily. (Patient not taking: Reported on 09/03/2016) 1 Inhaler 1  . metoprolol (LOPRESSOR) 50 MG tablet Take 1 tablet (50 mg total) by mouth 2 (two) times daily. (Patient not taking: Reported on 09/03/2016) 60 tablet 0  . pantoprazole (PROTONIX) 40 MG tablet Take 1 tablet (40 mg total) by mouth daily. (Patient not taking: Reported on 09/03/2016) 30 tablet 0  . traZODone (DESYREL) 100 MG tablet Take 1 tablet (100 mg total) by mouth at bedtime as needed for sleep. (Patient not taking: Reported on 09/03/2016) 30 tablet 0   No facility-administered medications prior to visit.     ROS Review of Systems  Respiratory: Positive for cough and shortness of breath.     Objective:  BP 112/74 (BP Location: Right Arm, Patient Position: Sitting, Cuff Size: Small)   Pulse (!) 122   Temp 97.6 F (36.4 C) (Oral)   Ht 5\' 4"  (1.626 m)   Wt 138 lb (62.6 kg)   SpO2 100%   BMI 23.69 kg/m   BP/Weight 09/03/2016 [id number]  Systolic BP [id number] 97 0000000  Diastolic BP 74 71 96  Wt. (Lbs) 138  133 -  BMI 23.69 22.83 -  Some encounter information is confidential and restricted. Go to Review Flowsheets activity to see all data.     Physical Exam  Constitutional: He is oriented to person, place, and time. He appears well-developed and well-nourished.  Cardiovascular: Normal rate, normal heart sounds and intact distal pulses.   No murmur heard. Pulmonary/Chest: Effort normal and breath sounds normal. He has no wheezes. He has no rales. He exhibits no tenderness.  Abdominal: Soft. Bowel sounds are normal. He exhibits no distension and no mass. There is no tenderness.  Musculoskeletal: Normal range of motion.  Neurological: He is alert and oriented to person, place, and time.     Assessment & Plan:   1. Chronic obstructive pulmonary disease, unspecified COPD type ([place]) Uncontrolled He has not been using his Flovent or rescue MDI Inhalers have been refilled Educated that smoking cessation will also help retard progression of COPD -  fluticasone (FLOVENT HFA) 110 MCG/ACT inhaler; Inhale 1 puff into the lungs 2 (two) times daily.  Dispense: 1 Inhaler; Refill: 1  2. Atrial tachycardia ([place]) Uncontrolled as he has been controlled with metoprolol - metoprolol (LOPRESSOR) 50 MG tablet; Take 1 tablet (50 mg total) by mouth 2 (two) times daily.  Dispense: 60 tablet; Refill: 1  3. Benign essential HTN Controlled  4. Pulmonary nodules Will need repeat CT in 12 months given he is high risk due to history of smoking and he has a positive family history of lung cancer  5. Tobacco abuse Spent 3 minutes counseling on cessation adverse effects of smoking on the lungs he is uninterested in quitting  6. Other insomnia - traZODone (DESYREL) 100 MG tablet; Take 1 tablet (100 mg total) by mouth at bedtime as needed for sleep.  Dispense: 30 tablet; Refill: 1   Meds ordered this encounter  Medications  . traZODone (DESYREL) 100 MG tablet    Sig: Take 1 tablet (100 mg total) by mouth at  bedtime as needed for sleep.    Dispense:  30 tablet    Refill:  1  . metoprolol (LOPRESSOR) 50 MG tablet    Sig: Take 1 tablet (50 mg total) by mouth 2 (two) times daily.    Dispense:  60 tablet    Refill:  1  . pantoprazole (PROTONIX) 40 MG tablet    Sig: Take 1 tablet (40 mg total) by mouth daily.    Dispense:  30 tablet    Refill:  1  . fluticasone (FLOVENT HFA) 110 MCG/ACT inhaler    Sig: Inhale 1 puff into the lungs 2 (two) times daily.    Dispense:  1 Inhaler    Refill:  1    Follow-up: Return in about 2 weeks (around 09/17/2016) for TCC - follow up of COPD.   Arnoldo Morale MD

## 2016-09-03 NOTE — BH Specialist Note (Signed)
Session Start time: 2:20 PM   End Time: 2:40 PM Total Time:  20 minutes Type of Service: [place]: No.   Interpreter Name & Language: N/A # [facility] Visits July 2017-June 2018: 1st   SUBJECTIVE: Samuel Reyes is a 61 y.o. male  Pt. was referred by Dr. Jarold Song for:  anxiety, depression and community resources (substance use treatment). Pt. reports the following symptoms/concerns: overwhelming feelings of sadness and worry, difficulty sleeping, irritability, substance use, and suicidal ideations Duration of problem:  Ongoing Severity: moderately severe Previous treatment: Pt has participated in behavioral health and substance use treatment in past. Pt denies receiving services at present time. Per review of chart, pt has case Freight forwarder with [facility]   OBJECTIVE: Mood: Anxious and Hopeless & Affect: Depressed Risk of harm to self or others: Pt has hx of suicidal ideations, suicide attempts, and self-harming behavior (cutting) Pt denies plan or intent to self-harm and/or harm others Assessments administered: PHQ-9; GAD-7  LIFE CONTEXT:  Family & Social: Pt resides with two friends. He states he is unsure of how long his living arrangements will remain due to feeling like "a third wheel" Pt has strained relationship with mother and two sisters who reside nearby. 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 He stated that he has decreased alcohol use from six 40 oz beers to two 40 ox beers a day. Pt smokes half a pack of cigarettes daily Life changes: Pt grieves the loss of his wife and two sons. He has a strained relationship with remaining family members What is important to pt/family (values): Independence   GOALS ADDRESSED:  Decrease symptoms of depression Decrease symptoms of anxiety  INTERVENTIONS: Motivational  Interviewing, Strength-based and Supportive   ASSESSMENT:  Pt currently experiencing depression and anxiety triggered by ongoing medical concerns and active substance use. Pt reports overwhelming feelings of sadness and worry, difficulty sleeping, irritability, substance use, and suicidal ideations. He grieves the loss of his wife and two sons and has a strained relationship with remaining family members. Pt may benefit from psychotherapy and medication management. [place] educated pt on the cycle of depression and anxiety and discussed how substance use can negatively impact pt's mental and physical health. LCSWA inquired protective factors with pt, assisted in creating a safety plan, and provided resources for crisis intervention. Pt and LCSWA discussed realistic healthy coping skills that can assist in a decrease of symptoms and feelings of hopelessness. He receives services with [facility].      PLAN: 1. F/U with behavioral health clinician: Pt was encouraged to contact [place] if symptoms worsen or fail to improve to schedule behavioral appointments at [facility]. 2. Behavioral Health meds: Wellbutrin and Desyrel 3. Behavioral recommendations: LCSWA recommends that pt apply healthy coping skills discussed, continue to participate in services with [facility], and utilize community resources. Pt is encouraged to schedule follow up appointment with LCSWA 4. Referral: Brief Counseling/Psychotherapy, [employer], Problem-solving teaching/coping strategies, Psychoeducation and Supportive Counseling 5. From scale of 1-10, how likely are you to follow plan: Samuel Reyes, MSW, [place] Worker 09/04/16 4:04 PM  Warmhandoff:   Warm Hand Off Completed.

## 2016-09-04 ENCOUNTER — Telehealth: Payer: Self-pay

## 2016-09-04 NOTE — Telephone Encounter (Signed)
Call received from Heath Lark, [facility] Liaison at [facility].  She confirmed that the patient is active with them and Hope, CM has already contacted the patient and will be contacting him again today. She said that he has been followed by their CM on and off in the past.   [facility] # [phone]  Message routed to Christa See, [place]

## 2016-09-04 NOTE — Telephone Encounter (Signed)
Attempted to contact Samuel Reyes, [facility] to inquire what services they are providing for the patient in the community. Message left requesting a call back to # [phone].

## 2016-09-18 ENCOUNTER — Telehealth: Payer: Self-pay

## 2016-09-18 NOTE — Telephone Encounter (Signed)
Call placed to the patient to check on his status and to discuss scheduling a follow up appointment with Dr Jarold Song. The patient stated that he is getting over a cold and is doing " a little better."  He was very anxious to get off of the phone. He said that he has all of his medications and has been taking them as ordered. He noted that he has been using his nebulizer 2-3 times a day if needed.    He was agreeable to scheduling a follow up appointment with Dr Jarold Song and requested that the appointment be scheduled for 09/27/16 as he would have money for transportation. He said that he has not contacted medicaid for a transportation assessment. This CM provided him with the contact for [facility] # [phone] and encouraged him to call.  An appointment with Dr Jarold Song was scheduled for 09/27/16 @ 0945. He was appreciative of the call and then quickly hung up.

## 2016-09-20 ENCOUNTER — Telehealth: Payer: Self-pay | Admitting: Internal Medicine

## 2016-09-20 NOTE — Telephone Encounter (Signed)
Hope, nurse from [facility], called the office to speak with PCP regarding the evaluation that she did on patient. In the patient's PHQ 9, patient scored 22. A suicide screening was done as well. Patient stated that in the past he attempted to commit suicide but it hasn't happened since then. Pt notified Hope that he's homeless. He refused being placed in a shelter. [facility] will try to get him elderly housing.   Thank you.

## 2016-09-20 NOTE — Telephone Encounter (Signed)
Will forward to [place] and pcp

## 2016-09-24 NOTE — Telephone Encounter (Signed)
Jasmine - could you f/u? He has appt w/ Dr Jarold Song this 09/27/16. Thanks.

## 2016-09-26 ENCOUNTER — Telehealth: Payer: Self-pay

## 2016-09-26 NOTE — Telephone Encounter (Signed)
Call placed to the patient along with Christa See, LCSW and confirmed the patient's TCC appointment for tomorrow, 09/27/16 @ 0945 at [facility]. He said that he is arranging transportation. Instructed him to call the clinic this afternoon if he is not able to arrange a ride and cab transportation can be arranged. He was provided the # [phone] for the clinic.    He said that he is feeling " so so" and  he is out of all of his medications. Stressed the importance of coming to his appointment tomorrow to have his medications reviewed and filled as needed.   He noted that he is homeless and he has discussed his desire for housing with his [place]. He said that he would like to rent a room and receives $750/month disability.  Clearence Ped, LCSW to meet with him tomorrow to review housing options and the  services provided by the housing coalition and the [facility] as well as [facility] and medicaid transportation.  He noted that he does not want to go to a shelter.   No other concerns/questions at this time.   Message sent to Dr Jarold Song informing her that he is out of all of his medications.

## 2016-09-27 ENCOUNTER — Ambulatory Visit: Payer: Medicaid Other | Attending: Family Medicine | Admitting: Family Medicine

## 2016-09-27 ENCOUNTER — Encounter: Payer: Self-pay | Admitting: Family Medicine

## 2016-09-27 ENCOUNTER — Encounter: Payer: Self-pay | Admitting: Licensed Clinical Social Worker

## 2016-09-27 VITALS — BP 137/92 | HR 112 | Temp 98.1°F | Ht 64.0 in | Wt 139.4 lb

## 2016-09-27 DIAGNOSIS — Z888 Allergy status to other drugs, medicaments and biological substances status: Secondary | ICD-10-CM | POA: Diagnosis not present

## 2016-09-27 DIAGNOSIS — Z79899 Other long term (current) drug therapy: Secondary | ICD-10-CM | POA: Diagnosis not present

## 2016-09-27 DIAGNOSIS — F1010 Alcohol abuse, uncomplicated: Secondary | ICD-10-CM | POA: Insufficient documentation

## 2016-09-27 DIAGNOSIS — F1020 Alcohol dependence, uncomplicated: Secondary | ICD-10-CM

## 2016-09-27 DIAGNOSIS — R918 Other nonspecific abnormal finding of lung field: Secondary | ICD-10-CM | POA: Insufficient documentation

## 2016-09-27 DIAGNOSIS — R109 Unspecified abdominal pain: Secondary | ICD-10-CM | POA: Diagnosis not present

## 2016-09-27 DIAGNOSIS — F419 Anxiety disorder, unspecified: Secondary | ICD-10-CM | POA: Diagnosis not present

## 2016-09-27 DIAGNOSIS — Z590 Homelessness: Secondary | ICD-10-CM | POA: Diagnosis not present

## 2016-09-27 DIAGNOSIS — G4700 Insomnia, unspecified: Secondary | ICD-10-CM | POA: Insufficient documentation

## 2016-09-27 DIAGNOSIS — J449 Chronic obstructive pulmonary disease, unspecified: Secondary | ICD-10-CM | POA: Diagnosis not present

## 2016-09-27 DIAGNOSIS — F329 Major depressive disorder, single episode, unspecified: Secondary | ICD-10-CM | POA: Insufficient documentation

## 2016-09-27 DIAGNOSIS — R1084 Generalized abdominal pain: Secondary | ICD-10-CM | POA: Diagnosis not present

## 2016-09-27 DIAGNOSIS — Z720 Tobacco use: Secondary | ICD-10-CM

## 2016-09-27 DIAGNOSIS — Z9889 Other specified postprocedural states: Secondary | ICD-10-CM | POA: Diagnosis not present

## 2016-09-27 DIAGNOSIS — I471 Supraventricular tachycardia: Secondary | ICD-10-CM | POA: Diagnosis not present

## 2016-09-27 DIAGNOSIS — F331 Major depressive disorder, recurrent, moderate: Secondary | ICD-10-CM

## 2016-09-27 DIAGNOSIS — Z8673 Personal history of transient ischemic attack (TIA), and cerebral infarction without residual deficits: Secondary | ICD-10-CM | POA: Insufficient documentation

## 2016-09-27 DIAGNOSIS — I10 Essential (primary) hypertension: Secondary | ICD-10-CM | POA: Diagnosis not present

## 2016-09-27 MED ORDER — BUSPIRONE HCL 7.5 MG PO TABS: 7.5000 mg | ORAL_TABLET | Freq: Two times a day (BID) | ORAL | 1 refills | Status: DC

## 2016-09-27 NOTE — Progress Notes (Signed)
[facility]itional [facility]  Date of telephone encounter: 08/28/16  Hospitalization dates: 08/21/16 through 08/27/16  PCP: Dr Janne Napoleon. Subjective:    Patient ID: Samuel Reyes, male    DOB: November 12, 1955, 61 y.o.   MRN: [id number]  HPI [name] is a 61 year old male with a history of COPD, tobacco abuse, hypertension, insomnia presents For a follow-up visit. He had a recent hospitalization for Bronchiolitis/COPD exacerbation/healthcare associated pneumonia.  He had presented with worsening shortness of breath and there was a concern for healthcare associated pneumonia given recent previous hospitalization. His chest CT abdomen and pelvis revealed a tree-in-bud appearance of the lungs consistent with bronchiolitis, nodular opacities in keeping with pulmonary nodules, tiny pulmonary consolidation in right upper and lower lobes. Cultures were negative. He was treated with broad-spectrum antibiotics which was later transitioned to oral Levaquin and prednisone taper on which he was discharged.   He is depressed at this time and has a lot of anxiety due to the fact that he is homeless and indicates on his [place] 9 he wishes he were dead. He has a history of multiple psychological trauma- in the last 5 years he has bury 2 sons and his wife; he has a previous history of suicidal attempt but denies any plans for suicide at this time. He states if he had a place to live he wouldn't have this feeling.  Informs me he thought his SSI check today and should be able to pick up his medication.  Past Medical History:  Diagnosis Date  . COPD (chronic obstructive pulmonary disease) ([place])   . Depression   . GERD (gastroesophageal reflux disease)   . Headache   . Hypertension   . Stroke [facility])     Past Surgical History:  Procedure Laterality Date  . BACK SURGERY     3 cervical spine surgeries C4-C5 fused  . COLONOSCOPY N/A 01/04/2014   Procedure: COLONOSCOPY;  Surgeon: Danie Binder, MD;  [facility]  [facility];  Service: Endoscopy;  Laterality: N/A;  1:45  . ESOPHAGOGASTRODUODENOSCOPY N/A 01/04/2014   Procedure: ESOPHAGOGASTRODUODENOSCOPY (EGD);  Surgeon: Danie Binder, MD;  Location: [facility];  Service: Endoscopy;  Laterality: N/A;  . FINGER SURGERY Left    2nd, 3rd, & 4th fingers were cut off by table saw and reattached  . GASTRECTOMY    . HERNIA REPAIR    . INCISIONAL HERNIA REPAIR N/A 01/20/2014   Procedure: LAPAROSCOPIC RECURRENT  INCISIONAL HERNIA with mesh;  Surgeon: Edward Jolly, MD;  Location: [facility];  Service: General;  Laterality: N/A;  . rt knee arthroscopic surgery    . SHOULDER SURGERY Bilateral    3 surgeries on on left, 2 surgeries on right     Allergies  Allergen Reactions  . Bee Venom Anaphylaxis  . Penicillins Rash    Has patient had a PCN reaction causing immediate rash, facial/tongue/throat swelling, SOB or lightheadedness with hypotension: {Yes Has patient had a PCN reaction causing severe rash involving mucus membranes or skin necrosis: NO Has patient had a PCN reaction that required hospitalization {Yes Has patient had a PCN reaction occurring within the last 10 years: NO If all of the above answers are "NO", then may proceed with Cephalosporin use.     Current Outpatient Prescriptions on File Prior to Visit  Medication Sig Dispense Refill  . albuterol (PROVENTIL HFA;VENTOLIN HFA) 108 (90 Base) MCG/ACT inhaler Inhale 1-2 puffs into the lungs every 4 (four) hours as needed for wheezing. (Patient not taking: Reported on  09/03/2016) 1 Inhaler 1  . benzonatate (TESSALON) 200 MG capsule Take 1 capsule (200 mg total) by mouth 3 (three) times daily as needed for cough. (Patient not taking: Reported on 09/27/2016) 20 capsule 0  . buPROPion (WELLBUTRIN SR) 200 MG 12 hr tablet Take 1 tablet (200 mg total) by mouth at bedtime. (Patient not taking: Reported on 09/03/2016) 30 tablet 0  . fluticasone (FLOVENT HFA) 110 MCG/ACT inhaler Inhale 1 puff into the lungs 2  (two) times daily. (Patient not taking: Reported on 09/27/2016) 1 Inhaler 1  . folic acid (FOLVITE) 1 MG tablet Take 1 tablet (1 mg total) by mouth daily. (Patient not taking: Reported on 09/03/2016) 30 tablet 2  . gabapentin (NEURONTIN) 100 MG capsule Take 1 capsule (100 mg total) by mouth 3 (three) times daily. For agitation/alcohol withdrawal syndrome (Patient not taking: Reported on 09/03/2016) 90 capsule 0  . ipratropium-albuterol (DUONEB) 0.5-2.5 (3) MG/3ML SOLN Take 4 times daily as needed for wheezing (Patient not taking: Reported on 09/27/2016) 360 mL 0  . metoprolol (LOPRESSOR) 50 MG tablet Take 1 tablet (50 mg total) by mouth 2 (two) times daily. (Patient not taking: Reported on 09/27/2016) 60 tablet 1  . Multiple Vitamin (MULTIVITAMIN WITH MINERALS) TABS tablet Take 1 tablet by mouth daily. (Patient not taking: Reported on 09/27/2016) 30 tablet 0  . nicotine (NICODERM CQ - DOSED IN MG/24 HOURS) 21 mg/24hr patch Place 1 patch (21 mg total) onto the skin daily. For nicotine addiction (Patient not taking: Reported on 09/03/2016) 28 patch 0  . pantoprazole (PROTONIX) 40 MG tablet Take 1 tablet (40 mg total) by mouth daily. (Patient not taking: Reported on 09/27/2016) 30 tablet 1  . predniSONE (DELTASONE) 20 MG tablet Take 3 tablets once daily for 4 days, then take 2 tablets once daily for 4 days, then take 1 tablet once daily for 4 days and then STOP. (Patient not taking: Reported on 09/27/2016) 24 tablet 0  . thiamine 100 MG tablet Take 1 tablet (100 mg total) by mouth daily. (Patient not taking: Reported on 09/03/2016) 30 tablet 2  . traZODone (DESYREL) 100 MG tablet Take 1 tablet (100 mg total) by mouth at bedtime as needed for sleep. (Patient not taking: Reported on 09/27/2016) 30 tablet 1   No current facility-administered medications on file prior to visit.      Review of Systems  Constitutional: Negative for activity change and appetite change.  HENT: Negative for sinus pressure and sore throat.   Eyes:  Negative for visual disturbance.  Respiratory: Positive for cough and shortness of breath. Negative for chest tightness.   Cardiovascular: Negative for chest pain and leg swelling.  Gastrointestinal: Negative for abdominal distention, abdominal pain, constipation and diarrhea.  Endocrine: Negative.   Genitourinary: Negative for dysuria.  Musculoskeletal: Negative for joint swelling and myalgias.  Skin: Negative for rash.  Allergic/Immunologic: Negative.   Neurological: Negative for weakness, light-headedness and numbness.  Psychiatric/Behavioral: Positive for dysphoric mood. Negative for suicidal ideas.       Objective: Vitals:   09/27/16 0955  BP: (!) 137/92  Pulse: (!) 112  Temp: 98.1 F (36.7 C)  TempSrc: Oral  SpO2: 99%  Weight: 139 lb 6.4 oz (63.2 kg)  Height: 5\' 4"  (1.626 m)      Physical Exam  Constitutional: He is oriented to person, place, and time. He appears well-developed and well-nourished.  Cardiovascular: Normal heart sounds and intact distal pulses.  Tachycardia present.   No murmur heard. Pulmonary/Chest: Effort normal and breath  sounds normal. He has no wheezes. He has no rales. He exhibits no tenderness.  Abdominal: Soft. Bowel sounds are normal. He exhibits no distension and no mass. There is no tenderness.  Musculoskeletal: Normal range of motion.  Neurological: He is alert and oriented to person, place, and time.  Psychiatric: He exhibits a depressed mood.          Assessment & Plan:  1. Chronic obstructive pulmonary disease, unspecified COPD type ([place]) Uncontrolled He has not been using his Flovent or rescue MDI Inhalers have been refilled Educated that smoking cessation will also help retard progression of COPD - fluticasone (FLOVENT HFA) 110 MCG/ACT inhaler; Inhale 1 puff into the lungs 2 (two) times daily.  Dispense: 1 Inhaler; Refill: 1  2. Atrial tachycardia ([place]) Uncontrolled as he has been out of metoprolol - metoprolol (LOPRESSOR) 50  MG tablet; Take 1 tablet (50 mg total) by mouth 2 (two) times daily.  Dispense: 60 tablet; Refill: 1  3. Benign essential HTN Controlled  4. Pulmonary nodules Will need repeat CT in 12 months given he is high risk due to history of smoking and he has a positive family history of lung cancer  5. Tobacco abuse Spent 3 minutes counseling on cessation adverse effects of smoking on the lungs he is uninterested in quitting  6. Other insomnia - traZODone (DESYREL) 100 MG tablet; Take 1 tablet (100 mg total) by mouth at bedtime as needed for sleep.  Dispense: 30 tablet; Refill: 1  7.Alcohol abuse Counseled on cessation Continue gabapentin for neuropathy Provided resources for substance abuse counseling LCSW had a therapy session with the patient and has provided housing resources for the patient.  8.Anxiety and depression He has undergone multiple stressors  Including family deaths Currently exacerbated by homelessness Not in crisis at this time, has no plans for suicide - provided crisis line number   9. Abdominal pain Abdominal x-ray ordered

## 2016-09-27 NOTE — BH Specialist Note (Signed)
Session Start time: 10:00 AM   End Time: 10:30 AM Total Time:  30 minutes Type of Service: [place]: No.   Interpreter Name & Language: N/A # [facility] Visits July 2017-June 2018: 2nd   SUBJECTIVE: Samuel Reyes is a 61 y.o. male  Pt. was referred by Dr. Jarold Song for:  anxiety and depression. Pt. reports the following symptoms/concerns: overwhelming feelings of sadness and worry, difficulty sleeping, irritability, substance use, and suicidal ideations Duration of problem:  Ongoing Severity: severe Previous treatment: Pt has participated in behavioral health and substance use treatment in past. Pt denies receiving services at present time. Per review of chart, pt has case Freight forwarder with [facility]   OBJECTIVE: Mood: Anxious & Affect: Depressed Risk of harm to self or others: Pt has hx of suicidal ideations, suicide attempts, and self-harming behavior (cutting) Pt denies plan or intent to self-harm and/or harm others Assessments administered: PHQ-9; GAD-7  LIFE CONTEXT:  Family & Social: Pt [place] at a park across the street from his mother's residence. 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 He stated that he has decreased alcohol use from six 40 oz beers to three to four 40 ox beers a day. Pt smokes half a pack of cigarettes daily  Life changes: Pt grieves the loss of his wife and two sons. His mother has dementia and he does not get along with stepfather. What is important to pt/family (values): Independence   GOALS ADDRESSED:  Decrease symptoms of depression Decrease symptoms of anxiety Increase adequate support system for patient  INTERVENTIONS: Solution Focused   ASSESSMENT:  Pt currently experiencing depression and anxiety triggered by ongoing medical concerns and active  substance use. Pt reports overwhelming feelings of sadness and worry, difficulty sleeping, irritability, substance use, and suicidal ideations. He grieves the loss of his wife and two sons and has a strained relationship with remaining family members. He receives [facility] with [facility]. Pt may benefit from substance use treatment, psychotherapy and medication management. Pt is open to participating in substance use treatment. LCSWA referred pt to [facility] for alcohol dependence, sober living, and transportation to medical appointments. Pt was provided transportation to complete intake through [facility]. Pt denies plan or intent to self-harm and/or harm others. He is knowledgeable of resources for crisis intervention, therapy, and medication management.       PLAN: 1. F/U with behavioral health clinician: Pt was encouraged tocontact LCSWA if symptoms worsen or fail to improveto schedule behavioral appointments at [facility]. 2. Behavioral Health meds: Wellbutrin and Desyrel 3. Behavioral recommendations: LCSWA recommends that pt complete intake with [facility], continue to participate in services with [facility], and utilize community resources for behavioral services. Pt is encouraged to schedule follow up appointment with LCSWA 4. Referral: Brief Counseling/Psychotherapy, [employer], Problem-solving teaching/coping strategies, Supportive Counseling and Referral to Substance Abuse Program 5. From scale of 1-10, how likely are you to follow plan: 9/10   Rebekah Chesterfield, MSW, [place] Worker 09/28/16 9:24 AM  Geraldine Contras:   Warm Hand Off Completed.

## 2016-09-28 ENCOUNTER — Telehealth: Payer: Self-pay

## 2016-09-28 NOTE — Telephone Encounter (Signed)
Call placed to the patient to inquire if he obtained all of his medications. He said that he did not get them yet and will get them on Monday, 10/01/16.  Stressed with him the importance of obtaining the medications and taking them as ordered. He said that he understood and will get them on Monday.

## 2016-10-02 ENCOUNTER — Telehealth: Payer: Self-pay

## 2016-10-02 NOTE — Telephone Encounter (Signed)
Call placed to the patient to check on his status and to inquire if he has picked up his medications.    He said that he is doing " okay " except for the weather. He noted that he is staying with a friend and the friend does not have a car. He said that he has not picked up his medications yet. Stressed the importance of picking up the medications and taking them as ordered.  He said that he understands and knows that he needs to get them . He noted that he has no transportation but taking the bus is an option to get to the clinic.

## 2016-11-29 ENCOUNTER — Encounter: Payer: Self-pay | Admitting: Gastroenterology

## 2016-12-13 ENCOUNTER — Encounter: Payer: Self-pay | Admitting: Internal Medicine

## 2016-12-14 ENCOUNTER — Encounter: Payer: Self-pay | Admitting: Internal Medicine

## 2016-12-17 ENCOUNTER — Encounter: Payer: Self-pay | Admitting: Internal Medicine

## 2017-05-17 ENCOUNTER — Emergency Department (HOSPITAL_COMMUNITY)
Admission: EM | Admit: 2017-05-17 | Discharge: 2017-05-17 | Disposition: A | Discharge status: Other Acute Inpatient Hospital | Payer: Medicaid Other | Attending: Emergency Medicine | Admitting: Emergency Medicine

## 2017-05-17 ENCOUNTER — Emergency Department (HOSPITAL_COMMUNITY): Payer: Medicaid Other

## 2017-05-17 ENCOUNTER — Encounter (HOSPITAL_COMMUNITY): Payer: Self-pay | Admitting: Emergency Medicine

## 2017-05-17 DIAGNOSIS — I10 Essential (primary) hypertension: Secondary | ICD-10-CM | POA: Diagnosis not present

## 2017-05-17 DIAGNOSIS — F17210 Nicotine dependence, cigarettes, uncomplicated: Secondary | ICD-10-CM | POA: Diagnosis not present

## 2017-05-17 DIAGNOSIS — F1010 Alcohol abuse, uncomplicated: Secondary | ICD-10-CM | POA: Insufficient documentation

## 2017-05-17 DIAGNOSIS — Z79899 Other long term (current) drug therapy: Secondary | ICD-10-CM | POA: Diagnosis not present

## 2017-05-17 DIAGNOSIS — R079 Chest pain, unspecified: Secondary | ICD-10-CM | POA: Insufficient documentation

## 2017-05-17 DIAGNOSIS — R45851 Suicidal ideations: Secondary | ICD-10-CM | POA: Diagnosis not present

## 2017-05-17 DIAGNOSIS — Y907 Blood alcohol level of 200-239 mg/100 ml: Secondary | ICD-10-CM | POA: Diagnosis not present

## 2017-05-17 DIAGNOSIS — J449 Chronic obstructive pulmonary disease, unspecified: Secondary | ICD-10-CM | POA: Diagnosis not present

## 2017-05-17 DIAGNOSIS — F329 Major depressive disorder, single episode, unspecified: Secondary | ICD-10-CM | POA: Diagnosis not present

## 2017-05-17 HISTORY — DX: Alcohol dependence, uncomplicated: F10.20

## 2017-05-17 LAB — COMPREHENSIVE METABOLIC PANEL
ALBUMIN: 3.1 g/dL — AB (ref 3.5–5.0)
ALT: 22 U/L (ref 17–63)
AST: 23 U/L (ref 15–41)
Alkaline Phosphatase: 88 U/L (ref 38–126)
Anion gap: 12 (ref 5–15)
CHLORIDE: 97 mmol/L — AB (ref 101–111)
CO2: 24 mmol/L (ref 22–32)
CREATININE: 0.74 mg/dL (ref 0.61–1.24)
Calcium: 8.4 mg/dL — ABNORMAL LOW (ref 8.9–10.3)
GFR calc Af Amer: >60 mL/min (ref >60)
GLUCOSE: 72 mg/dL (ref 65–99)
Potassium: 2.8 mmol/L — ABNORMAL LOW (ref 3.5–5.1)
Sodium: 133 mmol/L — ABNORMAL LOW (ref 135–145)
Total Bilirubin: 0.5 mg/dL (ref 0.3–1.2)
Total Protein: 7.5 g/dL (ref 6.5–8.1)

## 2017-05-17 LAB — CBC
HEMATOCRIT: 37.6 % — AB (ref 39.0–52.0)
Hemoglobin: 12.5 g/dL — ABNORMAL LOW (ref 13.0–17.0)
MCH: 26.9 pg (ref 26.0–34.0)
MCHC: 33.2 g/dL (ref 30.0–36.0)
MCV: 80.9 fL (ref 78.0–100.0)
Platelets: 308 10*3/uL (ref 150–400)
RBC: 4.65 MIL/uL (ref 4.22–5.81)
RDW: 19.4 % — AB (ref 11.5–15.5)
WBC: 15.9 10*3/uL — ABNORMAL HIGH (ref 4.0–10.5)

## 2017-05-17 LAB — RAPID URINE DRUG SCREEN, HOSP PERFORMED
Amphetamines: NOT DETECTED
BARBITURATES: NOT DETECTED
Benzodiazepines: NOT DETECTED
COCAINE: NOT DETECTED
Opiates: NOT DETECTED
TETRAHYDROCANNABINOL: NOT DETECTED

## 2017-05-17 LAB — ACETAMINOPHEN LEVEL

## 2017-05-17 LAB — ETHANOL: Alcohol, Ethyl (B): 227 mg/dL — ABNORMAL HIGH (ref <10)

## 2017-05-17 LAB — SALICYLATE LEVEL

## 2017-05-17 MED ORDER — POTASSIUM CHLORIDE CRYS ER 20 MEQ PO TBCR
40.0000 meq | EXTENDED_RELEASE_TABLET | Freq: Once | ORAL | Status: AC
  Administered 2017-05-17: 40 meq via ORAL
  Filled 2017-05-17: qty 2

## 2017-05-17 MED ORDER — THIAMINE HCL 100 MG/ML IJ SOLN: 100.0000 mg | Freq: Every day | INTRAMUSCULAR | Status: DC

## 2017-05-17 MED ORDER — NICOTINE 14 MG/24HR TD PT24
14.0000 mg | MEDICATED_PATCH | Freq: Once | TRANSDERMAL | Status: DC
  Administered 2017-05-17: 14 mg via TRANSDERMAL
  Filled 2017-05-17: qty 1

## 2017-05-17 MED ORDER — LORAZEPAM 1 MG PO TABS: 0.0000 mg | ORAL_TABLET | Freq: Two times a day (BID) | ORAL | Status: DC

## 2017-05-17 MED ORDER — LORAZEPAM 2 MG/ML IJ SOLN: 0.0000 mg | Freq: Two times a day (BID) | INTRAMUSCULAR | Status: DC

## 2017-05-17 MED ORDER — VITAMIN B-1 100 MG PO TABS
100.0000 mg | ORAL_TABLET | Freq: Every day | ORAL | Status: DC
  Administered 2017-05-17: 100 mg via ORAL
  Filled 2017-05-17: qty 1

## 2017-05-17 MED ORDER — LORAZEPAM 1 MG PO TABS
0.0000 mg | ORAL_TABLET | Freq: Four times a day (QID) | ORAL | Status: DC
  Administered 2017-05-17 (×2): 1 mg via ORAL
  Filled 2017-05-17 (×2): qty 1

## 2017-05-17 MED ORDER — LORAZEPAM 2 MG/ML IJ SOLN: 0.0000 mg | Freq: Four times a day (QID) | INTRAMUSCULAR | Status: DC

## 2017-05-17 NOTE — ED Notes (Signed)
Social worker at bedside.

## 2017-05-17 NOTE — ED Notes (Signed)
Patient admitted to [facility] room 42 for suicidal ideations. Patient states the his "son died 80 years ago and my wife died 4 years ago and I didn't want to be here anymore. I just don't care". Patient is endorsing depression but denies SI. Patient states that he wants "somebody to give me a shot to take me on out of here". Special checks and video monitoring in place at this time. Patient safe on the unit, will continue to monitor.

## 2017-05-17 NOTE — Progress Notes (Signed)
CSW received a call from RN asking if pt can indeed go to [facility].  RN taking report at [place], per [facility] ED RN asked, "Pt is suicidal.  What if pt wants to leave?"  RN was unsure if pt was still accepted, consulted this CSW Probation officer and CSW called [facility] [employer] [facility] and spoke to [place] in admissions.  Kevin Fenton spoke to [employer] at [facility] and confirmed pt was still accepted.  CSW Research scientist (medical) and updated that pt can be D/C'd to [place], per [place] at [employer].  Please reconsult if future social work needs arise.  CSW signing off, as social work intervention is no longer needed.  Samuel Reyes. Samuel Demeritt, LCSW, LCAS, CSI Clinical Social Worker Ph: [phone]

## 2017-05-17 NOTE — ED Notes (Signed)
This nurse spoke with [place] work. Informs will update in regards to placement/status on transfer.

## 2017-05-17 NOTE — ED Provider Notes (Signed)
Patient accepted to [facility]. No acute complaints. Hemodynamic stable.Stable for transfer.   Fatima Blank, MD 05/17/17 [phone]

## 2017-05-17 NOTE — ED Triage Notes (Signed)
Per EMS-patient has had 5 40 oz beers since this am-voicing SI-states he wants to be put to sleep like a dog or have the police shoot him

## 2017-05-17 NOTE — ED Notes (Signed)
Pt accepted psych facility. MD with pt now [facility] in route.

## 2017-05-17 NOTE — BH Assessment (Signed)
[facility] Assessment Progress Note  Per Waylan Boga, DNP, this pt requires psychiatric hospitalization at this time.  The following facilities have been contacted to seek placement for this pt, with results as noted:  Beds available, information sent, decision pending:  [place], [place] Triage Specialist [phone]

## 2017-05-17 NOTE — ED Notes (Signed)
Patient was ambulatory to bathroom without assistance.

## 2017-05-17 NOTE — ED Notes (Signed)
Pt discharged with [facility], all pt belongings given to driver.

## 2017-05-17 NOTE — BH Assessment (Signed)
[facility] Assessment Progress Note  Case was staffed with Lord DNP who recommended a inpatient admission as appropriate bed placement is investigated.         

## 2017-05-17 NOTE — ED Notes (Signed)
Report given to [facility] RN 

## 2017-05-17 NOTE — ED Notes (Signed)
Bed: [id number] Expected date:  Expected time:  Means of arrival:  Comments: Hold for 26

## 2017-05-17 NOTE — BH Assessment (Addendum)
Assessment Note  Samuel Reyes is an 61 y.o. male that presents this date with thoughts of self harm with a plan to have law enforcement "shoot him." Patient has a history of depression, COPD and multiple health issues that he states has worsened his thoughts of self harm. Patient states he has felt suicidal for "a while" but could not identify a plan until early today after he thought about "all the loss he has suffered." Patient states his wife passed away 4 years ago and 2 of his children have died and he recently lost his dog. Patient states he is currently homeless but has been living with a friend in a very "bad area" conducive to relapse. Patient states he has been attempting to maintain his sobriety from alcohol but states "why should I even try." Patient reports ongoing daily use of alcohol stating he consumes 4 to 8 40 oz beers daily with last use this date when patient reported he consumed 4 40 oz beers. Patient reports current withdrawals to include: fatigue, agitation and tremors.    , Patient states his living situation has deteriorated recently and has been living at shelters and camping in the woods. Patient states he lacks any social support/s  and does not have any resources to get help. He states he feels "tired of going on" and doesn't want to live anymore. Patient reports he has seen multiple providers in the area to assist with depression but denies having a current provider or being on any MH medications. Patient reports ongoing symptoms of depression to include: isolating, fatigue and feeling hopeless. Patient reports one prior attempt at self harm in the past but cannot recall the time frame. Per note review, patient was last admitted at [facility] in 2017 for S/I and excessive alcohol use. Patient denies any H/I or AVH. Patient is oriented to place only and is partially impaired at the time of assessment with a BAL of 227. He states he has tried slitting his wrist in the past, no recent self  mutilation.  Patient denies illicit drug use. States he would like help with his suicidal thoughts as well as his drinking. Patient is endorsing depression and active S/I. Patient states that he wants "somebody to give me a shot to take me on out of here". Case was staffed with Reita Cliche DNP who recommended a inpatient admission as appropriate bed placement is investigated.  Diagnosis: MDD without psychotic features, severe Alcohol abuse severe  Past Medical History:  Past Medical History:  Diagnosis Date  . Alcoholism ([place])   . COPD (chronic obstructive pulmonary disease) ([place])   . Depression   . GERD (gastroesophageal reflux disease)   . Headache   . Hypertension   . Stroke [facility])     Past Surgical History:  Procedure Laterality Date  . BACK SURGERY     3 cervical spine surgeries C4-C5 fused  . COLONOSCOPY N/A 01/04/2014   Procedure: COLONOSCOPY;  Surgeon: Danie Binder, MD;  Location: [facility];  Service: Endoscopy;  Laterality: N/A;  1:45  . ESOPHAGOGASTRODUODENOSCOPY N/A 01/04/2014   Procedure: ESOPHAGOGASTRODUODENOSCOPY (EGD);  Surgeon: Danie Binder, MD;  Location: [facility];  Service: Endoscopy;  Laterality: N/A;  . FINGER SURGERY Left    2nd, 3rd, & 4th fingers were cut off by table saw and reattached  . GASTRECTOMY    . HERNIA REPAIR    . INCISIONAL HERNIA REPAIR N/A 01/20/2014   Procedure: LAPAROSCOPIC RECURRENT  INCISIONAL HERNIA with mesh;  Surgeon:  Edward Jolly, MD;  Location: [facility] OR[facility];  Service: General;  Laterality: N/A;  . rt knee arthroscopic surgery    . SHOULDER SURGERY Bilateral    3 surgeries on on left, 2 surgeries on right     Family History:  Family History  Problem Relation Age of Onset  . Cancer Father        bone  . Cancer Brother        lungs  . Stroke Maternal Grandmother   . Asthma Son        died at age 5 in his sleep   . Spina bifida Son        died at age 42   . Dementia Mother   . Colon cancer Neg Hx     Social History:   reports that he has been smoking Cigarettes.  He started smoking about 50 years ago. He has a 22.50 pack-year smoking history. He has never used smokeless tobacco. He reports that he drinks about 1.8 - 2.4 oz of alcohol per week . He reports that he does not use drugs.  Additional Social History:  Alcohol / Drug Use Pain Medications: See MAR Prescriptions: See MAR Over the Counter: See MAR History of alcohol / drug use?: Yes Longest period of sobriety (when/how long): Two months ago when patient was involved at [facility] patient stated he maintained his sobriety for the month of November but relapsed in December 2016. Negative Consequences of Use: Financial, Personal relationships Withdrawal Symptoms: Tremors, Agitation Substance #1 Name of Substance 1: Alcohol 1 - Age of First Use: 15 1 - Amount (size/oz): 40 oz beers 1 - Frequency: Daily 1 - Duration: Pt states "forever" pt is vague in reference to amounts 1 - Last Use / Amount: 05/18/19 pt reports consuming 4 40 oz beers   CIWA: CIWA-Ar BP: 119/84 Pulse Rate: 92 COWS:    Allergies:  Allergies  Allergen Reactions  . Bee Venom Anaphylaxis  . Penicillins Rash    Has patient had a PCN reaction causing immediate rash, facial/tongue/throat swelling, SOB or lightheadedness with hypotension: {Yes Has patient had a PCN reaction causing severe rash involving mucus membranes or skin necrosis: NO Has patient had a PCN reaction that required hospitalization Yes Has patient had a PCN reaction occurring within the last 10 years: NO If all of the above answers are "NO", then may proceed with Cephalosporin use.     Home Medications:  (Not in a hospital admission)  OB/GYN Status:  No LMP for male patient.  General Assessment Data Location of Assessment: [facility] TTS Assessment: In system Is this a Tele or Face-to-Face Assessment?: Face-to-Face Is this an Initial Assessment or a Re-assessment for this encounter?: Initial Assessment Marital  status: Single Maiden name: NA Is patient pregnant?: No Pregnancy Status: No Living Arrangements: Non-relatives/Friends Can pt return to current living arrangement?: Yes Admission Status: Voluntary Is patient capable of signing voluntary admission?: Yes Referral Source: Self/Family/Friend Insurance type: Medicaid  Medical Screening Exam ([place]) Medical Exam completed: Yes  Crisis Care Plan Living Arrangements: Non-relatives/Friends Legal Guardian: Other: (NA) Name of Psychiatrist: None Name of Therapist: None  Education Status Is patient currently in school?: No Current Grade:  (NA) Highest grade of school patient has completed:  (7) Name of school:  (NA) Contact person:  (NA)  Risk to self with the past 6 months Suicidal Ideation: Yes-Currently Present Has patient been a risk to self within the past 6 months prior to admission? :  Yes Suicidal Intent: Yes-Currently Present Has patient had any suicidal intent within the past 6 months prior to admission? : No Is patient at risk for suicide?: Yes Suicidal Plan?: Yes-Currently Present Has patient had any suicidal plan within the past 6 months prior to admission? : No Specify Current Suicidal Plan: Suicide by police Access to Means: No What has been your use of drugs/alcohol within the last 12 months?: Current use Previous Attempts/Gestures: Yes How many times?: 1 Other Self Harm Risks:  (NA) Triggers for Past Attempts: Other (Comment) (Family deaths) Intentional Self Injurious Behavior: None Family Suicide History: No Recent stressful life event(s): Other (Comment) (Death of family members and pet) Persecutory voices/beliefs?: No Depression: Yes Depression Symptoms: Feeling angry/irritable, Feeling worthless/self pity Substance abuse history and/or treatment for substance abuse?: Yes Suicide prevention information given to non-admitted patients: Not applicable  Risk to Others within the past 6  months Homicidal Ideation: No Does patient have any lifetime risk of violence toward others beyond the six months prior to admission? : No Thoughts of Harm to Others: No Current Homicidal Intent: No Current Homicidal Plan: No Access to Homicidal Means: No Identified Victim:  (NA) History of harm to others?: No Assessment of Violence: None Noted Violent Behavior Description: NA Does patient have access to weapons?: No Criminal Charges Pending?: No Does patient have a court date: No Is patient on probation?: No  Psychosis Hallucinations: None noted Delusions: None noted  Mental Status Report Appearance/Hygiene: In scrubs Eye Contact: Poor Motor Activity: Freedom of movement Speech: Slow, Slurred Level of Consciousness: Drowsy Mood: Depressed Affect: Flat Anxiety Level: Minimal Thought Processes: Coherent, Relevant Judgement: Partial Orientation: Person, Place, Time Obsessive Compulsive Thoughts/Behaviors: None  Cognitive Functioning Concentration: Decreased Memory: Recent Intact, Remote Intact IQ: Average Insight: Poor Impulse Control: Poor Appetite: Fair Weight Loss: 0 Weight Gain: 0 Sleep: Decreased Total Hours of Sleep: 6 Vegetative Symptoms: None  ADLScreening Operating Room Services Assessment Services) Patient's cognitive ability adequate to safely complete daily activities?: Yes Patient able to express need for assistance with ADLs?: Yes Independently performs ADLs?: Yes (appropriate for developmental age)  Prior Inpatient Therapy Prior Inpatient Therapy: Yes Prior Therapy Dates: 2017 Prior Therapy Facilty/Provider(s): [facility] Reason for Treatment: MH issues  Prior Outpatient Therapy Prior Outpatient Therapy: Yes Prior Therapy Dates:  (Pt states "in the past") Prior Therapy Facilty/Provider(s): Pt stated "he can't remember' Reason for Treatment: MH issues Does patient have an ACCT team?: No Does patient have Intensive In-House Services?  : No Does patient have [facility]  services? : No Does patient have P4CC services?: No  ADL Screening (condition at time of admission) Patient's cognitive ability adequate to safely complete daily activities?: Yes Is the patient deaf or have difficulty hearing?: No Does the patient have difficulty seeing, even when wearing glasses/contacts?: No Does the patient have difficulty concentrating, remembering, or making decisions?: No Patient able to express need for assistance with ADLs?: Yes Does the patient have difficulty dressing or bathing?: No Independently performs ADLs?: Yes (appropriate for developmental age) Does the patient have difficulty walking or climbing stairs?: Yes (knee surgery) Weakness of Legs: None Weakness of Arms/Hands: None  Home Assistive Devices/Equipment Home Assistive Devices/Equipment: None  Therapy Consults (therapy consults require a physician order) PT Evaluation Needed: No OT Evalulation Needed: No SLP Evaluation Needed: No Abuse/Neglect Assessment (Assessment to be complete while patient is alone) Physical Abuse: Denies Verbal Abuse: Denies Sexual Abuse: Denies Exploitation of patient/patient's resources: Denies Self-Neglect: Denies Values / Beliefs Cultural Requests During Hospitalization: None Spiritual Requests During  Hospitalization: None Consults Spiritual Care Consult Needed: No Social Work Consult Needed: No Regulatory affairs officer (For Healthcare) Does Patient Have a Medical Advance Directive?: No Would patient like information on creating a medical advance directive?: No - Patient declined    Additional Information 1:1 In Past 12 Months?: No CIRT Risk: No Elopement Risk: No Does patient have medical clearance?: Yes     Disposition: Case was staffed with Reita Cliche DNP who recommended a inpatient admission as appropriate bed placement is investigated. Disposition Initial Assessment Completed for this Encounter: Yes Disposition of Patient: Inpatient treatment program Type  of inpatient treatment program: Adult  On Site Evaluation by:   Reviewed with Physician:    Mamie Nick 05/17/2017 3:14 PM

## 2017-05-17 NOTE — Progress Notes (Addendum)
CSW received a call from:  [place] Pt has been accepted by: [facility] Number for report is: [phone] or [phone] Pt's unit/room/bed number will be: [facility] Bed: TBD Accepting physician: Dr. Marylou Flesher   Pt can arrive ASAP on 05/17/17 or after am on 05/18/17 if potassium level is low  CSW will update RN and EDP.  Med list is requested by [facility] to be faxed to : [phone] Call [phone] and speak to [place] is voluntary, [facility] only takes voluntary pt and pt must be willing to go.  Alphonse Guild. Kimmie Doren, LCSW, LCAS, CSI Clinical Social Worker Ph: [phone]

## 2017-05-17 NOTE — ED Notes (Signed)
Bed: HY07 Expected date:  Expected time:  Means of arrival:  Comments: EMS-suicidal

## 2017-05-17 NOTE — Progress Notes (Addendum)
CSW received a call from: [place] Pt has been accepted by: [facility] Number for report is: [phone] Pt's unit/room/bed number will be: TBD Accepting physician: Marylou Flesher   Pt can arrive ASAP on 05/17/17 or after 9am 05/18/17  CSW will update RN and EDP.  Pt is voluntary and CSW will confirm pt is willing to D/C to [name].  6:41 PM CSW spoke to pt and pt is in agreement to D/C to [facility].  Alphonse Guild. Dannie Woolen, LCSW, LCAS, CSI Clinical Social Worker Ph: [phone]

## 2017-05-17 NOTE — ED Provider Notes (Signed)
[facility] DEPT Provider Note   CSN: [id number] Arrival date & time: 05/17/17  1210     History   Chief Complaint Chief Complaint  Patient presents with  . ETOH/SI    HPI Samuel Reyes is a 61 y.o. male who presents with SI. PMH significant for alcohol abuse, COPD, current tobacco use, GERD, HTN, hx of CVA. He states that his life has been slowly going downhill. He has been living in [place] but since his wife and both sons have died he moved in with his mother in [place]. He has been living there for several years but now lives with another person who he doesn't like. He has been drinking heavily - he drank three 40oz beers this morning. Denies any other drug use. He reports hx of complicated alcohol withdrawals and seizures. He states he no longer wants to live and doesn't have a reason to live. He doesn't work and has not been taking any of his medicines. He has access to guns at his mother's house. He has tried to hurt himself by cutting himself in the past. He reports daily headaches, dizziness, constant chest pain, SOB, cough, abdominal pain, N/V/D, joint pain. No recent fevers.  HPI  Past Medical History:  Diagnosis Date  . Alcoholism ([place])   . COPD (chronic obstructive pulmonary disease) ([place])   . Depression   . GERD (gastroesophageal reflux disease)   . Headache   . Hypertension   . Stroke [facility])     Patient Active Problem List   Diagnosis Date Noted  . Pulmonary nodules 09/03/2016  . Tobacco abuse 09/03/2016  . Insomnia 09/03/2016  . HCAP (healthcare-associated pneumonia) 08/22/2016  . Bronchiolitis 08/21/2016  . Malnutrition of moderate degree 08/04/2016  . Atrial tachycardia ([place]) 08/02/2016  . Impaired glucose tolerance 08/02/2016  . Alcohol-induced mood disorder ([place]) 12/12/2015  . Syncope 11/15/2015  . Chest pain 11/15/2015  . Nausea vomiting and diarrhea 11/15/2015  . Abdominal pain 11/15/2015  . Major depressive disorder,  recurrent episode, moderate with anxious distress ([place]) 10/30/2015  . Alcohol use disorder, severe, dependence ([place]) 10/29/2015  . COPD exacerbation ([place]) 09/20/2015  . Acute respiratory failure with hypoxia ([place]) 09/18/2015  . Alcoholism /alcohol abuse ([place])   . Suicidal ideation 09/17/2015  . Alcohol intoxication ([place]) 09/17/2015  . Depression 09/17/2015  . Benign essential HTN 09/17/2015  . Hypokalemia 09/17/2015  . Hyponatremia 09/17/2015  . Coffee ground emesis 09/17/2015  . COPD (chronic obstructive pulmonary disease) ([place]) 02/17/2013  . GERD (gastroesophageal reflux disease) 02/17/2013    Past Surgical History:  Procedure Laterality Date  . BACK SURGERY     3 cervical spine surgeries C4-C5 fused  . COLONOSCOPY N/A 01/04/2014   Procedure: COLONOSCOPY;  Surgeon: Danie Binder, MD;  Location: [facility]O SU[facility];  Service: Endoscopy;  Laterality: N/A;  1:45  . ESOPHAGOGASTRODUODENOSCOPY N/A 01/04/2014   Procedure: ESOPHAGOGASTRODUODENOSCOPY (EGD);  Surgeon: Danie Binder, MD;  Location: [facility] ENDO SUITE;  Service: Endoscopy;  Laterality: N/A;  . FINGER SURGERY Left    2nd, 3rd, & 4th fingers were cut off by table saw and reattached  . GASTRECTOMY    . HERNIA REPAIR    . INCISIONAL HERNIA REPAIR N/A 01/20/2014   Procedure: LAPAROSCOPIC RECURRENT  INCISIONAL HERNIA with mesh;  Surgeon: Edward Jolly, MD;  Location: [facility] ORS;  Service: General;  Laterality: N/A;  . rt knee arthroscopic surgery    . SHOULDER SURGERY Bilateral    3 surgeries on on left,  2 surgeries on right        Home Medications    Prior to Admission medications   Medication Sig Start Date End Date Taking? Authorizing Provider  albuterol (PROVENTIL HFA;VENTOLIN HFA) 108 (90 Base) MCG/ACT inhaler Inhale 1-2 puffs into the lungs every 4 (four) hours as needed for wheezing. Patient not taking: Reported on 09/03/2016 08/29/16   Arnoldo Morale, MD  benzonatate (TESSALON) 200 MG capsule Take 1 capsule (200 mg total) by  mouth 3 (three) times daily as needed for cough. Patient not taking: Reported on 09/27/2016 08/27/16   Bonnielee Haff, MD  buPROPion [facility] SR) 200 MG 12 hr tablet Take 1 tablet (200 mg total) by mouth at bedtime. Patient not taking: Reported on 09/03/2016 08/29/16   Arnoldo Morale, MD  busPIRone (BUSPAR) 7.5 MG tablet Take 1 tablet (7.5 mg total) by mouth 2 (two) times daily. 09/27/16   Arnoldo Morale, MD  fluticasone (FLOVENT HFA) 110 MCG/ACT inhaler Inhale 1 puff into the lungs 2 (two) times daily. Patient not taking: Reported on 09/27/2016 09/03/16   Arnoldo Morale, MD  folic acid (FOLVITE) 1 MG tablet Take 1 tablet (1 mg total) by mouth daily. Patient not taking: Reported on 09/03/2016 12/13/15   Niel Hummer, NP  gabapentin (NEURONTIN) 100 MG capsule Take 1 capsule (100 mg total) by mouth 3 (three) times daily. For agitation/alcohol withdrawal syndrome Patient not taking: Reported on 09/03/2016 12/13/15   Niel Hummer, NP  ipratropium-albuterol (DUONEB) 0.5-2.5 (3) MG/3ML SOLN Take 4 times daily as needed for wheezing Patient not taking: Reported on 09/27/2016 08/27/16   Bonnielee Haff, MD  metoprolol (LOPRESSOR) 50 MG tablet Take 1 tablet (50 mg total) by mouth 2 (two) times daily. Patient not taking: Reported on 09/27/2016 09/03/16   Arnoldo Morale, MD  Multiple Vitamin (MULTIVITAMIN WITH MINERALS) TABS tablet Take 1 tablet by mouth daily. Patient not taking: Reported on 09/27/2016 08/28/16   Bonnielee Haff, MD  nicotine (NICODERM CQ - DOSED IN MG/24 HOURS) 21 mg/24hr patch Place 1 patch (21 mg total) onto the skin daily. For nicotine addiction Patient not taking: Reported on 09/03/2016 11/07/15   Lindell Spar I, NP  pantoprazole (PROTONIX) 40 MG tablet Take 1 tablet (40 mg total) by mouth daily. Patient not taking: Reported on 09/27/2016 09/03/16   Arnoldo Morale, MD  predniSONE (DELTASONE) 20 MG tablet Take 3 tablets once daily for 4 days, then take 2 tablets once daily for 4 days, then take 1 tablet once daily  for 4 days and then STOP. Patient not taking: Reported on 09/27/2016 08/27/16   Bonnielee Haff, MD  thiamine 100 MG tablet Take 1 tablet (100 mg total) by mouth daily. Patient not taking: Reported on 09/03/2016 12/13/15   Niel Hummer, NP  traZODone (DESYREL) 100 MG tablet Take 1 tablet (100 mg total) by mouth at bedtime as needed for sleep. Patient not taking: Reported on 09/27/2016 09/03/16   Arnoldo Morale, MD    Family History Family History  Problem Relation Age of Onset  . Cancer Father        bone  . Cancer Brother        lungs  . Stroke Maternal Grandmother   . Asthma Son        died at age 30 in his sleep   . Spina bifida Son        died at age 69   . Dementia Mother   . Colon cancer Neg Hx     Social  History Social History  Substance Use Topics  . Smoking status: Current Every Day Smoker    Packs/day: 0.50    Years: 45.00    Types: Cigarettes    Start date: 07/30/1966  . Smokeless tobacco: Never Used  . Alcohol use 1.8 - 2.4 oz/week    3 - 4 Cans of beer per week     Comment: everyday- 3-4 40 oz     Allergies   Bee venom and Penicillins   Review of Systems Review of Systems  Constitutional: Negative for fever.  Respiratory: Positive for cough and shortness of breath.   Cardiovascular: Positive for chest pain.  Gastrointestinal: Positive for abdominal pain, diarrhea, nausea and vomiting.  Musculoskeletal: Positive for arthralgias, back pain, myalgias and neck pain.  Skin: Negative for wound.  Neurological: Positive for dizziness and headaches. Negative for seizures and syncope.  Psychiatric/Behavioral: Positive for dysphoric mood, self-injury and suicidal ideas. Negative for hallucinations. The patient is not nervous/anxious.   All other systems reviewed and are negative.    Physical Exam Updated Vital Signs BP 119/84 (BP Location: Right Arm)   Pulse 92   Temp 98.4 F (36.9 C) (Oral)   Resp 18   SpO2 100%   Physical Exam  Constitutional: He is oriented  to person, place, and time. He appears well-developed and well-nourished. No distress.  Disheveled, calm, cooperative  HENT:  Head: Normocephalic and atraumatic.  Eyes: Pupils are equal, round, and reactive to light. Conjunctivae are normal. Right eye exhibits no discharge. Left eye exhibits no discharge. No scleral icterus.  Neck: Normal range of motion.  Cardiovascular: Normal rate and regular rhythm.  Exam reveals no friction rub.   No murmur heard. Pulmonary/Chest: Effort normal and breath sounds normal. No respiratory distress. He has no wheezes. He has no rales. He exhibits no tenderness.  Abdominal: Soft. Bowel sounds are normal. He exhibits no distension. There is no tenderness.  Multiple surgical scars  Neurological: He is alert and oriented to person, place, and time.  Skin: Skin is warm and dry.  Psychiatric: His speech is normal and behavior is normal. Judgment normal. Thought content is not paranoid and not delusional. Cognition and memory are normal. He exhibits a depressed mood. He expresses suicidal ideation. He expresses no homicidal ideation. He expresses suicidal plans. He expresses no homicidal plans.  Nursing note and vitals reviewed.    ED Treatments / Results  Labs (all labs ordered are listed, but only abnormal results are displayed) Labs Reviewed  COMPREHENSIVE METABOLIC PANEL - Abnormal; Notable for the following:       Result Value   Sodium 133 (*)    Potassium 2.8 (*)    Chloride 97 (*)    BUN <5 (*)    Calcium 8.4 (*)    Albumin 3.1 (*)    All other components within normal limits  ETHANOL - Abnormal; Notable for the following:    Alcohol, Ethyl (B) 227 (*)    All other components within normal limits  CBC - Abnormal; Notable for the following:    WBC 15.9 (*)    Hemoglobin 12.5 (*)    HCT 37.6 (*)    RDW 19.4 (*)    All other components within normal limits  ACETAMINOPHEN LEVEL - Abnormal; Notable for the following:    Acetaminophen (Tylenol),  Serum <10 (*)    All other components within normal limits  RAPID URINE DRUG SCREEN, HOSP PERFORMED  SALICYLATE LEVEL    EKG  EKG Interpretation  None       Radiology Dg Chest 2 View  Result Date: 05/17/2017 CLINICAL DATA:  Chest pain EXAM: CHEST  2 VIEW COMPARISON:  Chest CT 08/21/2016 FINDINGS: Diffuse interstitial opacities, unchanged. No focal consolidation. No pulmonary edema, pleural effusion or pneumothorax. Cardiomediastinal contours are normal. IMPRESSION: No active cardiopulmonary disease. Electronically Signed   By: Ulyses Jarred M.D.   On: 05/17/2017 14:46    Procedures Procedures (including critical care time)  Medications Ordered in ED Medications  nicotine (NICODERM CQ - dosed in mg/24 hours) patch 14 mg (14 mg Transdermal Patch Applied 05/17/17 1426)  LORazepam (ATIVAN) injection 0-4 mg ( Intravenous See Alternative 05/17/17 1426)    Or  LORazepam (ATIVAN) tablet 0-4 mg (1 mg Oral Given 05/17/17 1426)  LORazepam (ATIVAN) injection 0-4 mg (not administered)    Or  LORazepam (ATIVAN) tablet 0-4 mg (not administered)  thiamine (VITAMIN B-1) tablet 100 mg (100 mg Oral Given 05/17/17 1425)    Or  thiamine (B-1) injection 100 mg ( Intravenous See Alternative 05/17/17 1425)  potassium chloride SA (K-DUR,KLOR-CON) CR tablet 40 mEq (40 mEq Oral Given 05/17/17 1426)     Initial Impression / Assessment and Plan / ED Course  I have reviewed the triage vital signs and the nursing notes.  Pertinent labs & imaging results that were available during my care of the patient were reviewed by me and considered in my medical decision making (see chart for details).  61 year old male presents with SI. Vitals are normal. Exam is overall unremarkable. CBC remarkable for WBC of 15.9 and anemia. CMP remarkable for hyponatremia, hypokalemia (2.8), hypochloremia. Likely due to dehydration/inadequate PO intake from alcohol abuse. ETOH level is 227. Tylenol and ASA are normal. UDS is  negative. CXR and EKG are negative.  He has been observed for multiple hours without evidence of alcohol withdrawal symptoms. Pt medically cleared.   Final Clinical Impressions(s) / ED Diagnoses   Final diagnoses:  Suicidal ideation  Alcohol abuse    New Prescriptions New Prescriptions   No medications on file     Iris Pert 05/17/17 Donnetta Hail, MD 05/20/17 [phone]

## 2017-06-14 ENCOUNTER — Emergency Department (HOSPITAL_COMMUNITY)
Admission: EM | Admit: 2017-06-14 | Discharge: 2017-06-14 | Disposition: A | Discharge status: Home / Self Care | Payer: Medicaid Other | Attending: Emergency Medicine | Admitting: Emergency Medicine

## 2017-06-14 ENCOUNTER — Emergency Department (HOSPITAL_COMMUNITY): Payer: Medicaid Other

## 2017-06-14 ENCOUNTER — Encounter (HOSPITAL_COMMUNITY): Payer: Self-pay | Admitting: Emergency Medicine

## 2017-06-14 DIAGNOSIS — J441 Chronic obstructive pulmonary disease with (acute) exacerbation: Secondary | ICD-10-CM | POA: Insufficient documentation

## 2017-06-14 DIAGNOSIS — Z79899 Other long term (current) drug therapy: Secondary | ICD-10-CM | POA: Insufficient documentation

## 2017-06-14 DIAGNOSIS — I10 Essential (primary) hypertension: Secondary | ICD-10-CM | POA: Diagnosis not present

## 2017-06-14 DIAGNOSIS — R05 Cough: Secondary | ICD-10-CM | POA: Diagnosis present

## 2017-06-14 DIAGNOSIS — F17210 Nicotine dependence, cigarettes, uncomplicated: Secondary | ICD-10-CM | POA: Diagnosis not present

## 2017-06-14 MED ORDER — ALBUTEROL SULFATE HFA 108 (90 BASE) MCG/ACT IN AERS
2.0000 | INHALATION_SPRAY | RESPIRATORY_TRACT | Status: DC | PRN
  Administered 2017-06-14: 2 via RESPIRATORY_TRACT
  Filled 2017-06-14: qty 6.7

## 2017-06-14 MED ORDER — PREDNISONE 20 MG PO TABS: 40.0000 mg | ORAL_TABLET | Freq: Every day | ORAL | 0 refills | Status: DC

## 2017-06-14 MED ORDER — AZITHROMYCIN 250 MG PO TABS: 250.0000 mg | ORAL_TABLET | Freq: Every day | ORAL | 0 refills | Status: DC

## 2017-06-14 MED ORDER — AEROCHAMBER PLUS FLO-VU LARGE MISC
1.0000 | Freq: Once | Status: AC
  Administered 2017-06-14: 1
  Filled 2017-06-14 (×2): qty 1

## 2017-06-14 MED ORDER — ALBUTEROL SULFATE (2.5 MG/3ML) 0.083% IN NEBU
5.0000 mg | INHALATION_SOLUTION | Freq: Once | RESPIRATORY_TRACT | Status: AC
  Administered 2017-06-14: 5 mg via RESPIRATORY_TRACT
  Filled 2017-06-14: qty 6

## 2017-06-14 MED ORDER — AZITHROMYCIN 250 MG PO TABS
500.0000 mg | ORAL_TABLET | Freq: Once | ORAL | Status: AC
  Administered 2017-06-14: 500 mg via ORAL
  Filled 2017-06-14: qty 2

## 2017-06-14 MED ORDER — PREDNISONE 20 MG PO TABS
60.0000 mg | ORAL_TABLET | Freq: Once | ORAL | Status: AC
  Administered 2017-06-14: 60 mg via ORAL
  Filled 2017-06-14: qty 3

## 2017-06-14 MED ORDER — IPRATROPIUM BROMIDE 0.02 % IN SOLN
0.5000 mg | Freq: Once | RESPIRATORY_TRACT | Status: AC
  Administered 2017-06-14: 0.5 mg via RESPIRATORY_TRACT
  Filled 2017-06-14: qty 2.5

## 2017-06-14 NOTE — ED Notes (Signed)
Respiratory at bedside.

## 2017-06-14 NOTE — ED Notes (Signed)
Pt educated on use of spacer for inhaler. Teach back method used.

## 2017-06-14 NOTE — ED Notes (Signed)
Pt given a bus pass and directed to the bus stop

## 2017-06-14 NOTE — ED Provider Notes (Signed)
[facility] DEPT Provider Note   CSN: [id number] Arrival date & time: 06/14/17  1603     History   Chief Complaint Chief Complaint  Patient presents with  . Cough    HPI Samuel Reyes is a 61 y.o. male.  Patient with history of alcoholism, COPD presents with worsening of chronic cough over the past 4 days with increased production of sputum.  Patient reports chills and subjective fevers.  He has sharp pain that is worse with deep breathing.  No hemoptysis.  States nausea.  He has been on albuterol in the past but does not currently have any.  He continues to smoke.  No associated URI symptoms, abdominal pain, diarrhea. States he has not had alcohol in 1.5 months. The onset of this condition was acute. The course is constant. Aggravating factors: none. Alleviating factors: none.        Past Medical History:  Diagnosis Date  . Alcoholism ([place])   . COPD (chronic obstructive pulmonary disease) ([place])   . Depression   . GERD (gastroesophageal reflux disease)   . Headache   . Hypertension   . Stroke [facility])     Patient Active Problem List   Diagnosis Date Noted  . Pulmonary nodules 09/03/2016  . Tobacco abuse 09/03/2016  . Insomnia 09/03/2016  . HCAP (healthcare-associated pneumonia) 08/22/2016  . Bronchiolitis 08/21/2016  . Malnutrition of moderate degree 08/04/2016  . Atrial tachycardia ([place]) 08/02/2016  . Impaired glucose tolerance 08/02/2016  . Alcohol-induced mood disorder ([place]) 12/12/2015  . Syncope 11/15/2015  . Chest pain 11/15/2015  . Nausea vomiting and diarrhea 11/15/2015  . Abdominal pain 11/15/2015  . Major depressive disorder, recurrent episode, moderate with anxious distress ([place]) 10/30/2015  . Alcohol use disorder, severe, dependence ([place]) 10/29/2015  . COPD exacerbation ([place]) 09/20/2015  . Acute respiratory failure with hypoxia ([place]) 09/18/2015  . Alcoholism /alcohol abuse ([place])   . Suicidal ideation 09/17/2015  .  Alcohol intoxication ([place]) 09/17/2015  . Depression 09/17/2015  . Benign essential HTN 09/17/2015  . Hypokalemia 09/17/2015  . Hyponatremia 09/17/2015  . Coffee ground emesis 09/17/2015  . COPD (chronic obstructive pulmonary disease) ([place]) 02/17/2013  . GERD (gastroesophageal reflux disease) 02/17/2013    Past Surgical History:  Procedure Laterality Date  . BACK SURGERY     3 cervical spine surgeries C4-C5 fused  . COLONOSCOPY N/A 01/04/2014   Performed by Danie Binder, MD at [facility]  . ESOPHAGOGASTRODUODENOSCOPY (EGD) N/A 01/04/2014   Performed by Danie Binder, MD at [facility]  . FINGER SURGERY Left    2nd, 3rd, & 4th fingers were cut off by table saw and reattached  . GASTRECTOMY    . HERNIA REPAIR    . LAPAROSCOPIC RECURRENT  INCISIONAL HERNIA with mesh N/A 01/20/2014   Performed by Excell Seltzer, MD at [facility]  . rt knee arthroscopic surgery    . SHOULDER SURGERY Bilateral    3 surgeries on on left, 2 surgeries on right        Home Medications    Prior to Admission medications   Medication Sig Start Date End Date Taking? Authorizing Provider  metoprolol (LOPRESSOR) 50 MG tablet Take 1 tablet (50 mg total) by mouth 2 (two) times daily. 09/03/16  Yes Arnoldo Morale, MD  traZODone (DESYREL) 100 MG tablet Take 1 tablet (100 mg total) by mouth at bedtime as needed for sleep. Patient taking differently: Take 100 mg at bedtime by mouth.  09/03/16  Yes  Arnoldo Morale, MD  albuterol (PROVENTIL HFA;VENTOLIN HFA) 108 (90 Base) MCG/ACT inhaler Inhale 1-2 puffs into the lungs every 4 (four) hours as needed for wheezing. 08/29/16   Arnoldo Morale, MD  Aspirin-Acetaminophen-Caffeine (GOODYS EXTRA STRENGTH PO) Take 1 packet by mouth every 2 (two) hours as needed (HEADACHE).    [provider]  benzonatate (TESSALON) 200 MG capsule Take 1 capsule (200 mg total) by mouth 3 (three) times daily as needed for cough. Patient not taking: Reported on 06/14/2017 08/27/16    Bonnielee Haff, MD  buPROPion [facility] SR) 200 MG 12 hr tablet Take 1 tablet (200 mg total) by mouth at bedtime. Patient not taking: Reported on 06/14/2017 08/29/16   Arnoldo Morale, MD  busPIRone (BUSPAR) 7.5 MG tablet Take 1 tablet (7.5 mg total) by mouth 2 (two) times daily. Patient not taking: Reported on 06/14/2017 09/27/16   Arnoldo Morale, MD  fluticasone (FLOVENT HFA) 110 MCG/ACT inhaler Inhale 1 puff into the lungs 2 (two) times daily. Patient not taking: Reported on 06/14/2017 09/03/16   Arnoldo Morale, MD  folic acid (FOLVITE) 1 MG tablet Take 1 tablet (1 mg total) by mouth daily. Patient not taking: Reported on 06/14/2017 12/13/15   Niel Hummer, NP  gabapentin (NEURONTIN) 100 MG capsule Take 1 capsule (100 mg total) by mouth 3 (three) times daily. For agitation/alcohol withdrawal syndrome Patient not taking: Reported on 06/14/2017 12/13/15   Niel Hummer, NP  ipratropium-albuterol (DUONEB) 0.5-2.5 (3) MG/3ML SOLN Take 4 times daily as needed for wheezing Patient not taking: Reported on 06/14/2017 08/27/16   Bonnielee Haff, MD  Multiple Vitamin (MULTIVITAMIN WITH MINERALS) TABS tablet Take 1 tablet by mouth daily. Patient not taking: Reported on 06/14/2017 08/28/16   Bonnielee Haff, MD  nicotine (NICODERM CQ - DOSED IN MG/24 HOURS) 21 mg/24hr patch Place 1 patch (21 mg total) onto the skin daily. For nicotine addiction Patient not taking: Reported on 06/14/2017 11/07/15   Lindell Spar I, NP  pantoprazole (PROTONIX) 40 MG tablet Take 1 tablet (40 mg total) by mouth daily. Patient not taking: Reported on 06/14/2017 09/03/16   Arnoldo Morale, MD  predniSONE (DELTASONE) 20 MG tablet Take 3 tablets once daily for 4 days, then take 2 tablets once daily for 4 days, then take 1 tablet once daily for 4 days and then STOP. Patient not taking: Reported on 06/14/2017 08/27/16   Bonnielee Haff, MD  thiamine 100 MG tablet Take 1 tablet (100 mg total) by mouth daily. Patient not taking: Reported on  06/14/2017 12/13/15   Niel Hummer, NP    Family History Family History  Problem Relation Age of Onset  . Cancer Father        bone  . Cancer Brother        lungs  . Stroke Maternal Grandmother   . Asthma Son        died at age 28 in his sleep   . Spina bifida Son        died at age 59   . Dementia Mother   . Colon cancer Neg Hx     Social History Social History   Tobacco Use  . Smoking status: Current Every Day Smoker    Packs/day: 0.50    Years: 45.00    Pack years: 22.50    Types: Cigarettes    Start date: 07/30/1966  . Smokeless tobacco: Never Used  Substance Use Topics  . Alcohol use: Yes    Alcohol/week: 1.8 - 2.4 oz  Types: 3 - 4 Cans of beer per week    Comment: everyday- 3-4 40 oz  . Drug use: No    Comment: denid using any drugs     Allergies   Bee venom and Penicillins   Review of Systems Review of Systems  Constitutional: Positive for chills and fever.  HENT: Negative for congestion, rhinorrhea and sore throat.   Eyes: Negative for redness.  Respiratory: Positive for cough. Negative for shortness of breath.   Cardiovascular: Positive for chest pain.  Gastrointestinal: Positive for nausea. Negative for abdominal pain, diarrhea and vomiting.  Genitourinary: Negative for dysuria.  Musculoskeletal: Negative for myalgias.  Skin: Negative for rash.  Neurological: Negative for headaches.     Physical Exam Updated Vital Signs BP (!) 145/81 (BP Location: Right Arm)   Pulse 91   Temp 98.2 F (36.8 C) (Oral)   Resp 16   SpO2 95%   Physical Exam  Constitutional: He appears well-developed and well-nourished.  HENT:  Head: Normocephalic and atraumatic.  Mouth/Throat: Oropharynx is clear and moist.  Eyes: Conjunctivae are normal. Right eye exhibits no discharge. Left eye exhibits no discharge.  Neck: Normal range of motion. Neck supple.  Cardiovascular: Normal rate, regular rhythm and normal heart sounds.  No murmur heard. Pulmonary/Chest:  Effort normal. No stridor. No respiratory distress. He has wheezes (Scattered moderate expiratory). He has no rales.  Abdominal: Soft. There is no tenderness. There is no rebound and no guarding.  Neurological: He is alert.  Skin: Skin is warm and dry.  Psychiatric: He has a normal mood and affect.  Nursing note and vitals reviewed.    ED Treatments / Results   EKG  EKG Interpretation None       Radiology Dg Chest 2 View  Result Date: 06/14/2017 CLINICAL DATA:  Shortness of breath EXAM: CHEST  2 VIEW COMPARISON:  Chest radiograph 05/17/2017 FINDINGS: The heart size and mediastinal contours are within normal limits. Both lungs are clear. The visualized skeletal structures are unremarkable. The lungs are hyperinflated, consistent with COPD. IMPRESSION: Unchanged appearance of the chest without acute cardiopulmonary disease. COPD. Electronically Signed   By: Ulyses Jarred M.D.   On: 06/14/2017 18:44    Procedures Procedures (including critical care time)  Medications Ordered in ED Medications  albuterol (PROVENTIL HFA;VENTOLIN HFA) 108 (90 Base) MCG/ACT inhaler 2 puff (2 puffs Inhalation Provided for home use 06/14/17 2038)  AEROCHAMBER PLUS FLO-VU LARGE MISC 1 each (1 each Other Given 06/14/17 1942)  albuterol (PROVENTIL) (2.5 MG/3ML) 0.083% nebulizer solution 5 mg (5 mg Nebulization Given 06/14/17 1924)  ipratropium (ATROVENT) nebulizer solution 0.5 mg (0.5 mg Nebulization Given 06/14/17 1924)  predniSONE (DELTASONE) tablet 60 mg (60 mg Oral Given 06/14/17 1938)  azithromycin (ZITHROMAX) tablet 500 mg (500 mg Oral Given 06/14/17 1940)     Initial Impression / Assessment and Plan / ED Course  I have reviewed the triage vital signs and the nursing notes.  Pertinent labs & imaging results that were available during my care of the patient were reviewed by me and considered in my medical decision making (see chart for details).     Patient seen and examined.  Chest x-ray  reviewed.  Medications ordered.  Will reassess after breathing treatment.  Patient is not hypoxic or tachycardic.  No fever here.  Will need treatment for COPD exacerbation.  Vital signs reviewed and are as follows: BP (!) 145/81 (BP Location: Right Arm)   Pulse 91   Temp 98.2 F (36.8 C) (  Oral)   Resp 16   SpO2 95%   8:38 PM called the patient room, patient states he needs to leave to catch the bus.  After breathing treatment, patient has improvement in wheezing.  He subjectively feels a bit better.  Home with albuterol, azithromycin, prednisone.  Patient counseled on use of albuterol HFA. Instructed to use 1-2 puffs q 4 hours as needed for SOB.  Encouraged to return to the emergency department with worsening shortness of breath, chest pain, trouble breathing, fever, new symptoms or other concerns.   Final Clinical Impressions(s) / ED Diagnoses   Final diagnoses:  COPD with acute exacerbation ([name])   Patient with cough with increased sputum production consistent with COPD exacerbation.  Wheezing noted on exam, improved in the emergency department with albuterol.  Chest x-ray does not show any pneumonia.  Patient is not tachycardic or hypoxic.  No chest pain consistent with ACS or PE.  Patient appears well at time of discharge.  Home with the above treatments.  ED Discharge Orders        Ordered    azithromycin (ZITHROMAX) 250 MG tablet  Daily     06/14/17 2033    predniSONE (DELTASONE) 20 MG tablet  Daily     06/14/17 2033       Carlisle Cater, PA-C 06/14/17 2040    Tanna Furry, MD 07/02/17 [phone]

## 2017-06-14 NOTE — ED Triage Notes (Signed)
Pt is c/o a non-productive cough that has been ongoing. Presenting EMS administered a duo-neb that pt reports helped. He is c/o pleurisy with cough, denies fevers or chills.

## 2017-06-14 NOTE — Discharge Instructions (Signed)
Please read and follow all provided instructions.  Your diagnoses today include:  1. COPD with acute exacerbation ([place])     Tests performed today include:  Chest x-ray - did not show pneumonia  Vital signs. See below for your results today.   Medications prescribed:   Albuterol inhaler - medication that opens up your airway  Use inhaler as follows: 1-2 puffs with spacer every 4 hours as needed for wheezing, cough, or shortness of breath.    Azithromycin - antibiotic for respiratory infection  You have been prescribed an antibiotic medicine: take the entire course of medicine even if you are feeling better. Stopping early can cause the antibiotic not to work.   Prednisone - steroid medicine   It is best to take this medication in the morning to prevent sleeping problems. If you are diabetic, monitor your blood sugar closely and stop taking Prednisone if blood sugar is over 300. Take with food to prevent stomach upset.   Take any prescribed medications only as directed.  Home care instructions:  Follow any educational materials contained in this packet.  Follow-up instructions: Please follow-up with your primary care provider in the next 3 days for further evaluation of your symptoms and management of your COPD.   Return instructions:   Please return to the Emergency Department if you experience worsening symptoms.  Please return with worsening wheezing, shortness of breath, or difficulty breathing.  Return with persistent fever above 101F.   Please return if you have any other emergent concerns.  Additional Information:  Your vital signs today were: BP (!) 145/81 (BP Location: Right Arm)    Pulse 91    Temp 98.2 F (36.8 C) (Oral)    Resp 16    SpO2 95%  If your blood pressure (BP) was elevated above 135/85 this visit, please have this repeated by your doctor within one month. --------------

## 2017-06-24 ENCOUNTER — Other Ambulatory Visit: Payer: Self-pay

## 2017-06-24 ENCOUNTER — Emergency Department (HOSPITAL_COMMUNITY): Payer: Medicaid Other

## 2017-06-24 ENCOUNTER — Encounter (HOSPITAL_COMMUNITY): Payer: Self-pay

## 2017-06-24 ENCOUNTER — Inpatient Hospital Stay (HOSPITAL_COMMUNITY)
Admission: EM | Admit: 2017-06-24 | Discharge: 2017-07-01 | DRG: 190 | Disposition: A | Discharge status: Home / Self Care | Payer: Medicaid Other | Attending: Internal Medicine | Admitting: Internal Medicine

## 2017-06-24 DIAGNOSIS — G4700 Insomnia, unspecified: Secondary | ICD-10-CM | POA: Diagnosis present

## 2017-06-24 DIAGNOSIS — R443 Hallucinations, unspecified: Secondary | ICD-10-CM | POA: Diagnosis present

## 2017-06-24 DIAGNOSIS — F17210 Nicotine dependence, cigarettes, uncomplicated: Secondary | ICD-10-CM | POA: Diagnosis present

## 2017-06-24 DIAGNOSIS — Z66 Do not resuscitate: Secondary | ICD-10-CM | POA: Diagnosis present

## 2017-06-24 DIAGNOSIS — F1020 Alcohol dependence, uncomplicated: Secondary | ICD-10-CM | POA: Diagnosis present

## 2017-06-24 DIAGNOSIS — E440 Moderate protein-calorie malnutrition: Secondary | ICD-10-CM | POA: Diagnosis present

## 2017-06-24 DIAGNOSIS — I10 Essential (primary) hypertension: Secondary | ICD-10-CM | POA: Diagnosis present

## 2017-06-24 DIAGNOSIS — Z79899 Other long term (current) drug therapy: Secondary | ICD-10-CM | POA: Diagnosis not present

## 2017-06-24 DIAGNOSIS — Z5900 Homelessness unspecified: Secondary | ICD-10-CM

## 2017-06-24 DIAGNOSIS — R911 Solitary pulmonary nodule: Secondary | ICD-10-CM | POA: Diagnosis present

## 2017-06-24 DIAGNOSIS — R079 Chest pain, unspecified: Secondary | ICD-10-CM

## 2017-06-24 DIAGNOSIS — R112 Nausea with vomiting, unspecified: Secondary | ICD-10-CM

## 2017-06-24 DIAGNOSIS — Z720 Tobacco use: Secondary | ICD-10-CM | POA: Diagnosis present

## 2017-06-24 DIAGNOSIS — D649 Anemia, unspecified: Secondary | ICD-10-CM

## 2017-06-24 DIAGNOSIS — Z23 Encounter for immunization: Secondary | ICD-10-CM | POA: Diagnosis not present

## 2017-06-24 DIAGNOSIS — R0602 Shortness of breath: Secondary | ICD-10-CM

## 2017-06-24 DIAGNOSIS — K219 Gastro-esophageal reflux disease without esophagitis: Secondary | ICD-10-CM | POA: Diagnosis present

## 2017-06-24 DIAGNOSIS — G4709 Other insomnia: Secondary | ICD-10-CM

## 2017-06-24 DIAGNOSIS — J441 Chronic obstructive pulmonary disease with (acute) exacerbation: Principal | ICD-10-CM | POA: Diagnosis present

## 2017-06-24 DIAGNOSIS — E876 Hypokalemia: Secondary | ICD-10-CM | POA: Diagnosis present

## 2017-06-24 DIAGNOSIS — J9601 Acute respiratory failure with hypoxia: Secondary | ICD-10-CM | POA: Diagnosis present

## 2017-06-24 DIAGNOSIS — I2510 Atherosclerotic heart disease of native coronary artery without angina pectoris: Secondary | ICD-10-CM | POA: Diagnosis present

## 2017-06-24 DIAGNOSIS — F1090 Alcohol use, unspecified, uncomplicated: Secondary | ICD-10-CM

## 2017-06-24 DIAGNOSIS — F329 Major depressive disorder, single episode, unspecified: Secondary | ICD-10-CM | POA: Diagnosis present

## 2017-06-24 DIAGNOSIS — I252 Old myocardial infarction: Secondary | ICD-10-CM

## 2017-06-24 DIAGNOSIS — Z7289 Other problems related to lifestyle: Secondary | ICD-10-CM

## 2017-06-24 DIAGNOSIS — Z6823 Body mass index (BMI) 23.0-23.9, adult: Secondary | ICD-10-CM | POA: Diagnosis not present

## 2017-06-24 DIAGNOSIS — R1013 Epigastric pain: Secondary | ICD-10-CM

## 2017-06-24 DIAGNOSIS — Z789 Other specified health status: Secondary | ICD-10-CM

## 2017-06-24 DIAGNOSIS — Z8673 Personal history of transient ischemic attack (TIA), and cerebral infarction without residual deficits: Secondary | ICD-10-CM | POA: Diagnosis not present

## 2017-06-24 HISTORY — DX: Atherosclerotic heart disease of native coronary artery without angina pectoris: I25.10

## 2017-06-24 LAB — CBC WITH DIFFERENTIAL/PLATELET
Basophils Absolute: 0 10*3/uL (ref 0.0–0.1)
Basophils Relative: 0 %
EOS ABS: 0 10*3/uL (ref 0.0–0.7)
Eosinophils Relative: 0 %
HCT: 34.5 % — ABNORMAL LOW (ref 39.0–52.0)
HEMOGLOBIN: 11 g/dL — AB (ref 13.0–17.0)
LYMPHS ABS: 2.2 10*3/uL (ref 0.7–4.0)
Lymphocytes Relative: 17 %
MCH: 25.6 pg — AB (ref 26.0–34.0)
MCHC: 31.9 g/dL (ref 30.0–36.0)
MCV: 80.2 fL (ref 78.0–100.0)
MONOS PCT: 9 %
Monocytes Absolute: 1.2 10*3/uL — ABNORMAL HIGH (ref 0.1–1.0)
NEUTROS ABS: 9.6 10*3/uL — AB (ref 1.7–7.7)
NEUTROS PCT: 74 %
Platelets: 416 10*3/uL — ABNORMAL HIGH (ref 150–400)
RBC: 4.3 MIL/uL (ref 4.22–5.81)
RDW: 16.4 % — ABNORMAL HIGH (ref 11.5–15.5)
WBC: 13 10*3/uL — AB (ref 4.0–10.5)

## 2017-06-24 LAB — COMPREHENSIVE METABOLIC PANEL
ALK PHOS: 76 U/L (ref 38–126)
ALT: 13 U/L — ABNORMAL LOW (ref 17–63)
ANION GAP: 10 (ref 5–15)
AST: 20 U/L (ref 15–41)
Albumin: 3.1 g/dL — ABNORMAL LOW (ref 3.5–5.0)
BILIRUBIN TOTAL: 0.6 mg/dL (ref 0.3–1.2)
BUN: 6 mg/dL (ref 6–20)
CALCIUM: 8.6 mg/dL — AB (ref 8.9–10.3)
CO2: 24 mmol/L (ref 22–32)
Chloride: 103 mmol/L (ref 101–111)
Creatinine, Ser: 0.75 mg/dL (ref 0.61–1.24)
Glucose, Bld: 93 mg/dL (ref 65–99)
Potassium: 3.3 mmol/L — ABNORMAL LOW (ref 3.5–5.1)
SODIUM: 137 mmol/L (ref 135–145)
TOTAL PROTEIN: 7 g/dL (ref 6.5–8.1)

## 2017-06-24 LAB — URINALYSIS, ROUTINE W REFLEX MICROSCOPIC
Bilirubin Urine: NEGATIVE
Glucose, UA: NEGATIVE mg/dL
Hgb urine dipstick: NEGATIVE
KETONES UR: NEGATIVE mg/dL
LEUKOCYTES UA: NEGATIVE
NITRITE: NEGATIVE
PROTEIN: NEGATIVE mg/dL
Specific Gravity, Urine: 1.001 — ABNORMAL LOW (ref 1.005–1.030)
pH: 6 (ref 5.0–8.0)

## 2017-06-24 LAB — I-STAT TROPONIN, ED: TROPONIN I, POC: 0 ng/mL (ref 0.00–0.08)

## 2017-06-24 LAB — LIPASE, BLOOD: Lipase: 22 U/L (ref 11–51)

## 2017-06-24 LAB — ETHANOL: Alcohol, Ethyl (B): 155 mg/dL — ABNORMAL HIGH (ref <10)

## 2017-06-24 LAB — BRAIN NATRIURETIC PEPTIDE: B Natriuretic Peptide: 39.6 pg/mL (ref 0.0–100.0)

## 2017-06-24 MED ORDER — ACETAMINOPHEN 325 MG PO TABS
650.0000 mg | ORAL_TABLET | Freq: Once | ORAL | Status: AC
Start: 2017-06-24 — End: 2017-06-24
  Administered 2017-06-24: 650 mg via ORAL
  Filled 2017-06-24: qty 2

## 2017-06-24 MED ORDER — LORAZEPAM 2 MG/ML IJ SOLN
0.0000 mg | Freq: Two times a day (BID) | INTRAMUSCULAR | Status: AC
  Administered 2017-06-27: 1 mg via INTRAVENOUS
  Filled 2017-06-24: qty 1

## 2017-06-24 MED ORDER — ALBUTEROL SULFATE (2.5 MG/3ML) 0.083% IN NEBU
5.0000 mg | INHALATION_SOLUTION | Freq: Once | RESPIRATORY_TRACT | Status: AC
  Administered 2017-06-24: 5 mg via RESPIRATORY_TRACT
  Filled 2017-06-24: qty 6

## 2017-06-24 MED ORDER — IPRATROPIUM-ALBUTEROL 0.5-2.5 (3) MG/3ML IN SOLN
3.0000 mL | Freq: Once | RESPIRATORY_TRACT | Status: AC
  Administered 2017-06-24: 3 mL via RESPIRATORY_TRACT
  Filled 2017-06-24: qty 3

## 2017-06-24 MED ORDER — ONDANSETRON HCL 4 MG/2ML IJ SOLN
4.0000 mg | Freq: Once | INTRAMUSCULAR | Status: AC
Start: 2017-06-24 — End: 2017-06-24
  Administered 2017-06-24: 4 mg via INTRAVENOUS
  Filled 2017-06-24: qty 2

## 2017-06-24 MED ORDER — METHYLPREDNISOLONE SODIUM SUCC 125 MG IJ SOLR
125.0000 mg | Freq: Once | INTRAMUSCULAR | Status: AC
  Administered 2017-06-24: 125 mg via INTRAVENOUS
  Filled 2017-06-24: qty 2

## 2017-06-24 MED ORDER — POTASSIUM CHLORIDE CRYS ER 20 MEQ PO TBCR
40.0000 meq | EXTENDED_RELEASE_TABLET | Freq: Once | ORAL | Status: AC
  Administered 2017-06-24: 40 meq via ORAL
  Filled 2017-06-24: qty 2

## 2017-06-24 MED ORDER — IPRATROPIUM BROMIDE 0.02 % IN SOLN
0.5000 mg | Freq: Once | RESPIRATORY_TRACT | Status: AC
  Administered 2017-06-24: 0.5 mg via RESPIRATORY_TRACT
  Filled 2017-06-24: qty 2.5

## 2017-06-24 MED ORDER — FAMOTIDINE IN NACL 20-0.9 MG/50ML-% IV SOLN
20.0000 mg | Freq: Once | INTRAVENOUS | Status: AC
  Administered 2017-06-24: 20 mg via INTRAVENOUS
  Filled 2017-06-24: qty 50

## 2017-06-24 MED ORDER — THIAMINE HCL 100 MG/ML IJ SOLN
100.0000 mg | Freq: Every day | INTRAMUSCULAR | Status: DC
  Administered 2017-06-26: 100 mg via INTRAVENOUS
  Filled 2017-06-24: qty 2

## 2017-06-24 MED ORDER — VITAMIN B-1 100 MG PO TABS
100.0000 mg | ORAL_TABLET | Freq: Every day | ORAL | Status: DC
  Administered 2017-06-25 – 2017-07-01 (×6): 100 mg via ORAL
  Filled 2017-06-24 (×7): qty 1

## 2017-06-24 MED ORDER — DOXYCYCLINE HYCLATE 100 MG PO TABS
100.0000 mg | ORAL_TABLET | Freq: Once | ORAL | Status: AC
Start: 2017-06-24 — End: 2017-06-24
  Administered 2017-06-24: 100 mg via ORAL
  Filled 2017-06-24: qty 1

## 2017-06-24 MED ORDER — ADULT MULTIVITAMIN W/MINERALS CH
1.0000 | ORAL_TABLET | Freq: Every day | ORAL | Status: DC
  Administered 2017-06-25 – 2017-07-01 (×7): 1 via ORAL
  Filled 2017-06-24 (×7): qty 1

## 2017-06-24 MED ORDER — FOLIC ACID 1 MG PO TABS
1.0000 mg | ORAL_TABLET | Freq: Every day | ORAL | Status: DC
  Administered 2017-06-25 – 2017-07-01 (×7): 1 mg via ORAL
  Filled 2017-06-24 (×7): qty 1

## 2017-06-24 MED ORDER — ALBUTEROL (5 MG/ML) CONTINUOUS INHALATION SOLN
10.0000 mg/h | INHALATION_SOLUTION | RESPIRATORY_TRACT | Status: DC
  Administered 2017-06-24: 10 mg/h via RESPIRATORY_TRACT
  Filled 2017-06-24: qty 20

## 2017-06-24 MED ORDER — LEVOFLOXACIN IN D5W 750 MG/150ML IV SOLN: 750.0000 mg | Freq: Once | INTRAVENOUS | Status: DC

## 2017-06-24 MED ORDER — ALBUTEROL SULFATE (2.5 MG/3ML) 0.083% IN NEBU
2.5000 mg | INHALATION_SOLUTION | Freq: Once | RESPIRATORY_TRACT | Status: AC
  Administered 2017-06-24: 2.5 mg via RESPIRATORY_TRACT
  Filled 2017-06-24: qty 3

## 2017-06-24 MED ORDER — LORAZEPAM 2 MG/ML IJ SOLN
1.0000 mg | Freq: Four times a day (QID) | INTRAMUSCULAR | Status: AC | PRN
  Filled 2017-06-24: qty 1

## 2017-06-24 MED ORDER — LORAZEPAM 1 MG PO TABS: 1.0000 mg | ORAL_TABLET | Freq: Four times a day (QID) | ORAL | Status: AC | PRN

## 2017-06-24 MED ORDER — GI COCKTAIL ~~LOC~~
30.0000 mL | Freq: Once | ORAL | Status: AC
  Administered 2017-06-24: 30 mL via ORAL
  Filled 2017-06-24: qty 30

## 2017-06-24 MED ORDER — LORAZEPAM 2 MG/ML IJ SOLN
0.0000 mg | Freq: Four times a day (QID) | INTRAMUSCULAR | Status: AC
  Administered 2017-06-25: 1 mg via INTRAVENOUS
  Administered 2017-06-25 (×2): 2 mg via INTRAVENOUS
  Administered 2017-06-25: 3 mg via INTRAVENOUS
  Administered 2017-06-26 (×2): 1 mg via INTRAVENOUS
  Administered 2017-06-26: 2 mg via INTRAVENOUS
  Administered 2017-06-26: 1 mg via INTRAVENOUS
  Filled 2017-06-24 (×2): qty 1
  Filled 2017-06-24: qty 2
  Filled 2017-06-24 (×5): qty 1

## 2017-06-24 NOTE — ED Notes (Signed)
Bed: YT03 Expected date:  Expected time:  Means of arrival:  Comments: Room 9

## 2017-06-24 NOTE — ED Notes (Signed)
Bed: WA09 Expected date:  Expected time:  Means of arrival:  Comments: EMS-COPD 

## 2017-06-24 NOTE — ED Notes (Signed)
Call report to [place] at [phone] at [phone]

## 2017-06-24 NOTE — ED Triage Notes (Signed)
EMS reports SOB with productive cough x 2 weeks. Hx COPD and Asthma. Took inhaler x 3 hours with no resolve. Persistent coughing causing dyspnea.  BP 119/86 HR 90 sp02 96 RA Resp 22  5mg  Albuterol enroute

## 2017-06-24 NOTE — ED Provider Notes (Signed)
[facility] DEPT Provider Note   CSN: [id number] Arrival date & time: 06/24/17  1622     History   Chief Complaint Chief Complaint  Patient presents with  . Shortness of Breath    HPI Samuel Reyes is a 61 y.o. male with a PMHx of alcoholism, COPD/asthma, CVA, HTN, GERD, headaches, depression, and other conditions listed below, who presents to the ED with complaints of gradually worsening cough and SOB x2 wks. Per chart review, he was seen in the ED on 06/14/17 for similar complaints, had CXR which was negative, was given duoneb and prednisone which improved his respiratory status, therefore he was sent home with prednisone 40mg  QD x4 days and Azithromycin for COPD exacerbation.  Patient states that he felt better for a few days after finishing the prednisone and azithromycin, however then he felt as though he got worse which is why he came here for repeat evaluation.  He states he has had cough with green yellow sputum production, shortness of breath with exertion, wheezing, chills, and green rhinorrhea x2 wks.  He also has had CP x3 wks which he describes as 8/10 constant dull left-sided chest pain that radiates into his left arm and epigastric area, worse with movement and coughing, and with no treatments tried prior to arrival.  He states that his other symptoms worsen with exertion, and have been unrelieved with his home albuterol inhaler as well as with 5mg  albuterol neb given en route.  He reports he's had approximately 6 episodes of NBNB emesis today as well.  He reports that he is staying at [employer] and there are multiple sick people around him.  He also admits to being a cigarette smoker still.  His PCP is at [facility].  He denies drinking alcohol today, last use was last week soemtime.  Positive family history of MI in his maternal grandmother.  He denies sore throat, ear pain/drainage, hemoptysis, lightheadedness, fevers,  LE swelling, recent travel/surgery/immobilization, personal/family hx of DVT/PE, abd pain, hematemesis, melena, hematochezia, diarrhea, constipation, hematuria, dysuria, myalgias, arthralgias, claudication, orthopnea, numbness, tingling, focal weakness, or any other complaints at this time.     The history is provided by the patient and medical records. No language interpreter was used.  Shortness of Breath  This is a recurrent problem. The average episode lasts 2 weeks. The problem occurs continuously.The current episode started more than 1 week ago. The problem has been gradually worsening. Associated symptoms include rhinorrhea, cough, sputum production, wheezing, chest pain and vomiting. Pertinent negatives include no fever, no sore throat, no ear pain, no hemoptysis, no orthopnea, no abdominal pain, no leg swelling and no claudication. It is unknown what precipitated the problem. Risk factors include smoking. He has tried beta-agonist inhalers and oral steroids (and azithromycin treatment) for the symptoms. The treatment provided no relief. He has had prior hospitalizations. He has had prior ED visits. Associated medical issues include asthma and COPD. Associated medical issues do not include PE, heart failure, DVT or recent surgery.    Past Medical History:  Diagnosis Date  . Alcoholism ([place])   . COPD (chronic obstructive pulmonary disease) ([place])   . Depression   . GERD (gastroesophageal reflux disease)   . Headache   . Hypertension   . Stroke [facility])     Patient Active Problem List   Diagnosis Date Noted  . Pulmonary nodules 09/03/2016  . Tobacco abuse 09/03/2016  . Insomnia 09/03/2016  . HCAP (healthcare-associated pneumonia) 08/22/2016  .  Bronchiolitis 08/21/2016  . Malnutrition of moderate degree 08/04/2016  . Atrial tachycardia ([place]) 08/02/2016  . Impaired glucose tolerance 08/02/2016  . Alcohol-induced mood disorder ([place]) 12/12/2015  . Syncope 11/15/2015  . Chest pain  11/15/2015  . Nausea vomiting and diarrhea 11/15/2015  . Abdominal pain 11/15/2015  . Major depressive disorder, recurrent episode, moderate with anxious distress ([place]) 10/30/2015  . Alcohol use disorder, severe, dependence ([place]) 10/29/2015  . COPD exacerbation ([place]) 09/20/2015  . Acute respiratory failure with hypoxia ([place]) 09/18/2015  . Alcoholism /alcohol abuse ([place])   . Suicidal ideation 09/17/2015  . Alcohol intoxication ([place]) 09/17/2015  . Depression 09/17/2015  . Benign essential HTN 09/17/2015  . Hypokalemia 09/17/2015  . Hyponatremia 09/17/2015  . Coffee ground emesis 09/17/2015  . COPD (chronic obstructive pulmonary disease) ([place]) 02/17/2013  . GERD (gastroesophageal reflux disease) 02/17/2013    Past Surgical History:  Procedure Laterality Date  . BACK SURGERY     3 cervical spine surgeries C4-C5 fused  . COLONOSCOPY N/A 01/04/2014   Procedure: COLONOSCOPY;  Surgeon: Danie Binder, MD;  Location: [facility] END[facility];  Service: Endoscopy;  Laterality: N/A;  1:45  . ESOPHAGOGASTRODUODENOSCOPY N/A 01/04/2014   Procedure: ESOPHAGOGASTRODUODENOSCOPY (EGD);  Surgeon: Danie Binder, MD;  Location: [facility] ENDO SUITE;  Service: Endoscopy;  Laterality: N/A;  . FINGER SURGERY Left    2nd, 3rd, & 4th fingers were cut off by table saw and reattached  . GASTRECTOMY    . HERNIA REPAIR    . INCISIONAL HERNIA REPAIR N/A 01/20/2014   Procedure: LAPAROSCOPIC RECURRENT  INCISIONAL HERNIA with mesh;  Surgeon: Edward Jolly, MD;  Location: [facility] ORS;  Service: General;  Laterality: N/A;  . rt knee arthroscopic surgery    . SHOULDER SURGERY Bilateral    3 surgeries on on left, 2 surgeries on right        Home Medications    Prior to Admission medications   Medication Sig Start Date End Date Taking? Authorizing Provider  albuterol (PROVENTIL HFA;VENTOLIN HFA) 108 (90 Base) MCG/ACT inhaler Inhale 1-2 puffs into the lungs every 4 (four) hours as needed for wheezing. 08/29/16  Yes Arnoldo Morale, MD   metoprolol (LOPRESSOR) 50 MG tablet Take 1 tablet (50 mg total) by mouth 2 (two) times daily. 09/03/16  Yes Arnoldo Morale, MD  Aspirin-Acetaminophen-Caffeine (GOODYS EXTRA STRENGTH PO) Take 1 packet by mouth every 2 (two) hours as needed ([place]).    [provider]  azithromycin (ZITHROMAX) 250 MG tablet Take 1 tablet (250 mg total) daily by mouth. Patient not taking: Reported on 06/24/2017 06/14/17   Carlisle Cater, PA-C  predniSONE (DELTASONE) 20 MG tablet Take 2 tablets (40 mg total) daily by mouth. Patient not taking: Reported on 06/24/2017 06/14/17   Carlisle Cater, PA-C  traZODone (DESYREL) 100 MG tablet Take 1 tablet (100 mg total) by mouth at bedtime as needed for sleep. Patient taking differently: Take 100 mg at bedtime by mouth.  09/03/16   Arnoldo Morale, MD    Family History Family History  Problem Relation Age of Onset  . Cancer Father        bone  . Cancer Brother        lungs  . Stroke Maternal Grandmother   . Asthma Son        died at age 33 in his sleep   . Spina bifida Son        died at age 38   . Dementia Mother   . Colon cancer Neg  Hx     Social History Social History   Tobacco Use  . Smoking status: Current Every Day Smoker    Packs/day: 0.50    Years: 45.00    Pack years: 22.50    Types: Cigarettes    Start date: 07/30/1966  . Smokeless tobacco: Never Used  Substance Use Topics  . Alcohol use: Yes    Alcohol/week: 1.8 - 2.4 oz    Types: 3 - 4 Cans of beer per week    Comment: everyday- 3-4 40 oz  . Drug use: No    Comment: denid using any drugs     Allergies   Bee venom and Penicillins   Review of Systems Review of Systems  Constitutional: Positive for chills. Negative for fever.  HENT: Positive for rhinorrhea. Negative for ear discharge, ear pain and sore throat.   Respiratory: Positive for cough, sputum production, shortness of breath and wheezing. Negative for hemoptysis.   Cardiovascular: Positive for chest pain. Negative for  orthopnea, claudication and leg swelling.  Gastrointestinal: Positive for nausea and vomiting. Negative for abdominal pain, blood in stool, constipation and diarrhea.  Genitourinary: Negative for dysuria and hematuria.  Musculoskeletal: Negative for arthralgias and myalgias.  Skin: Negative for color change.  Allergic/Immunologic: Negative for immunocompromised state.  Neurological: Negative for weakness, light-headedness and numbness.  Psychiatric/Behavioral: Negative for confusion.   All other systems reviewed and are negative for acute change except as noted in the HPI.    Physical Exam Updated Vital Signs BP 120/76 (BP Location: Right Arm)   Pulse 87   Temp 98 F (36.7 C) (Oral)   Ht 5\' 4"  (1.626 m)   Wt 61.2 kg (135 lb)   SpO2 97%   BMI 23.17 kg/m   Physical Exam  Constitutional: He is oriented to person, place, and time. Vital signs are normal. He appears well-developed and well-nourished.  Non-toxic appearance. No distress.  Afebrile, nontoxic, NAD  HENT:  Head: Normocephalic and atraumatic.  Mouth/Throat: Oropharynx is clear and moist and mucous membranes are normal.  Eyes: Conjunctivae and EOM are normal. Right eye exhibits no discharge. Left eye exhibits no discharge.  Neck: Normal range of motion. Neck supple.  Cardiovascular: Normal rate, regular rhythm, normal heart sounds and intact distal pulses. Exam reveals no gallop and no friction rub.  No murmur heard. RRR, nl s1/s2, no m/r/g, distal pulses intact, no pedal edema   Pulmonary/Chest: Accessory muscle usage present. No respiratory distress. He has decreased breath sounds. He has wheezes. He has rhonchi. He has no rales. He exhibits tenderness. He exhibits no crepitus, no deformity and no retraction.  Course sounding cough throughout exam, diffuse wheezing throughout all lung fields, slightly diminished sounds globally, slightly increased WOB and intercostal retractions/accessory muscle useage during intense  coughing, scattered rhonchi throughout, no appreciable rales, no hypoxia, speaking in slightly fragmented sentences mostly due to coughing, SpO2 97% on RA.  Chest wall with mild TTP to L anterior chest and near epigastric area, without crepitus, deformities, or abnormal retractions     Abdominal: Soft. Normal appearance and bowel sounds are normal. He exhibits no distension. There is tenderness in the epigastric area. There is no rigidity, no rebound, no guarding, no CVA tenderness, no tenderness at McBurney's point and negative Murphy's sign.  Soft, nondistended, +BS throughout, with mild epigastric TTP, no r/g/r, neg murphy's, neg mcburney's, no CVA TTP   Musculoskeletal: Normal range of motion.  MAE x4 Strength and sensation grossly intact in all extremities Distal pulses intact  No pedal edema, neg homan's bilaterally   Neurological: He is alert and oriented to person, place, and time. He has normal strength. No sensory deficit.  Skin: Skin is warm, dry and intact. No rash noted.  Psychiatric: He has a normal mood and affect.  Nursing note and vitals reviewed.    ED Treatments / Results  Labs (all labs ordered are listed, but only abnormal results are displayed) Labs Reviewed  CBC WITH DIFFERENTIAL/PLATELET - Abnormal; Notable for the following components:      Result Value   WBC 13.0 (*)    Hemoglobin 11.0 (*)    HCT 34.5 (*)    MCH 25.6 (*)    RDW 16.4 (*)    Platelets 416 (*)    Neutro Abs 9.6 (*)    Monocytes Absolute 1.2 (*)    All other components within normal limits  COMPREHENSIVE METABOLIC PANEL - Abnormal; Notable for the following components:   Potassium 3.3 (*)    Calcium 8.6 (*)    Albumin 3.1 (*)    ALT 13 (*)    All other components within normal limits  URINALYSIS, ROUTINE W REFLEX MICROSCOPIC - Abnormal; Notable for the following components:   Color, Urine STRAW (*)    Specific Gravity, Urine 1.001 (*)    All other components within normal limits    ETHANOL - Abnormal; Notable for the following components:   Alcohol, Ethyl (B) 155 (*)    All other components within normal limits  LIPASE, BLOOD  BRAIN NATRIURETIC PEPTIDE  I-STAT TROPONIN, ED    EKG  EKG Interpretation  Date/Time:  Monday June 24 2017 19:06:46 EST Ventricular Rate:  98 PR Interval:    QRS Duration: 94 QT Interval:  396 QTC Calculation: 506 R Axis:   77 Text Interpretation:  Sinus rhythm Probable left atrial enlargement Abnormal R-wave progression, early transition Prolonged QT interval No STEMI.  Confirmed by Nanda Quinton [phone]) on 06/24/2017 7:33:50 PM       Radiology Dg Chest 2 View  Result Date: 06/24/2017 CLINICAL DATA:  Chest pain and shortness of breath EXAM: CHEST  2 VIEW COMPARISON:  06/14/2017 FINDINGS: Normal heart size and mediastinal contours. There is no edema, consolidation, effusion, or pneumothorax. Chronic airway thickening. There were multiple centrilobular/tree-in-bud nodules by 08/21/2016 chest CT, largely occult by radiography. Surgical clips at the GE junction. Remote right anterior sixth and seventh rib fractures. No acute osseous finding. IMPRESSION: 1. No acute finding when compared to prior. 2. Chronic bronchitic markings. Electronically Signed   By: Monte Fantasia M.D.   On: 06/24/2017 17:47    Procedures Procedures (including critical care time)  CRITICAL CARE-- COPD exacerbation requiring multiple nebulizers Performed by: Reece Agar   Total critical care time: 45 minutes  Critical care time was exclusive of separately billable procedures and treating other patients.  Critical care was necessary to treat or prevent imminent or life-threatening deterioration.  Critical care was time spent personally by me on the following activities: development of treatment plan with patient and/or surrogate as well as nursing, discussions with consultants, evaluation of patient's response to treatment, examination of patient,  obtaining history from patient or surrogate, ordering and performing treatments and interventions, ordering and review of laboratory studies, ordering and review of radiographic studies, pulse oximetry and re-evaluation of patient's condition.   Medications Ordered in ED Medications  albuterol (PROVENTIL,VENTOLIN) solution continuous neb (10 mg/hr Nebulization New Bag/Given 06/24/17 2139)  ipratropium-albuterol (DUONEB) 0.5-2.5 (3) MG/3ML nebulizer solution 3 mL (3  mLs Nebulization Given 06/24/17 1700)  albuterol (PROVENTIL) (2.5 MG/3ML) 0.083% nebulizer solution 2.5 mg (2.5 mg Nebulization Given 06/24/17 1700)  ondansetron (ZOFRAN) injection 4 mg (4 mg Intravenous Given 06/24/17 1858)  gi cocktail (Maalox,Lidocaine,Donnatal) (30 mLs Oral Given 06/24/17 1859)  famotidine (PEPCID) IVPB 20 mg premix (0 mg Intravenous Stopped 06/24/17 1908)  methylPREDNISolone sodium succinate (SOLU-MEDROL) 125 mg/2 mL injection 125 mg (125 mg Intravenous Given 06/24/17 1858)  albuterol (PROVENTIL) (2.5 MG/3ML) 0.083% nebulizer solution 5 mg (5 mg Nebulization Given 06/24/17 2006)  ipratropium (ATROVENT) nebulizer solution 0.5 mg (0.5 mg Nebulization Given 06/24/17 2006)  acetaminophen (TYLENOL) tablet 650 mg (650 mg Oral Given 06/24/17 2030)  potassium chloride SA (K-DUR,KLOR-CON) CR tablet 40 mEq (40 mEq Oral Given 06/24/17 2112)  doxycycline (VIBRA-TABS) tablet 100 mg (100 mg Oral Given 06/24/17 2112)     Initial Impression / Assessment and Plan / ED Course  I have reviewed the triage vital signs and the nursing notes.  Pertinent labs & imaging results that were available during my care of the patient were reviewed by me and considered in my medical decision making (see chart for details).     61 y.o. male here with worsening cough and SOB x2wks, was seen here 10 days ago for similar complaints, CXR neg, improved with duoneb/prednisone so sent home with azithromycin and prednisone burst for COPD  exacerbation. Also c/o CP, wheezing, chills, rhinorrhea, and n/v. On exam, coughing throughout exam, diffuse wheezing throughout, slight increased WOB and intercostal retractions during intense coughing, scattered rhonchi throughout, globally diminished lung sounds; mild epigastric TTP and L chest wall TTP, no pedal edema, no hypoxia. Neg homan's sign bilaterally, neg murphy's exam. Will get labs, EKG, CXR, and give duoneb, zofran, GI cocktail, pepcid, solumedrol, and reassess shortly. May require admission if we can't improve his respiratory status with breathing treatments; Discussed case with my attending Dr. Laverta Baltimore who agrees with plan. Will reassess after work up completed.  7:37 PM CBC w/diff with marginally elevated WBC 13.0 but overall differential WNL and appears to be similar to prior values; also with stable chronic anemia similar to prior values. U/A WNL. Trop neg. EKG with borderline prolonged QT but overall similar to prior EKGs and no acute ischemic findings. CXR with chronic bronchitic findings, no superimposed PNA. Lung sounds marginally improved after duoneb, but not markedly improved; will repeat duoneb then reassess. Awaiting CMP, lipase, BNP, and EtOH level. Pt requesting tylenol for a headache that he states is because he's coughing so much; will give this now and reassess shortly.   9:00 PM CMP with marginally low K 3.3, similar to prior, will orally replete now; otherwise unremarkable. Lipase WNL. EtOH level not yet resulted. BNP WNL. Lung sounds still wheezy and with scattered rhonchi, SpO2 95-96% just sitting but still speaking in fragmented sentences and with slight increased WOB; has received total of 15mg  albuterol and 1mg  atrovent over total of 3 nebulizers (including one in EMS); at this point, I'm not convinced we'll be able to achieve adequate improvement to make him a candidate for outpatient management, and he already failed this earlier in the course of illness. Will proceed  with doxycycline for COPD exacerbation treatment, and CAT neb treatment, however proceed with admission.   9:44 PM EtOH level 155. Dr. Lorin Mercy of [facility] returning page and will admit. Holding orders to be placed by admitting team. Please see their notes for further documentation of care. I appreciate their help with this pleasant pt's care. Pt stable at  time of admission.    Final Clinical Impressions(s) / ED Diagnoses   Final diagnoses:  COPD exacerbation ([place])  SOB (shortness of breath)  Left sided chest pain  Nausea and vomiting in adult patient  Epigastric abdominal pain  Tobacco user  Chronic anemia  Hypokalemia  Alcohol use    ED Discharge Orders    [address] [place], [place] 06/24/17 2145    Margette Fast, MD 06/25/17 1153

## 2017-06-24 NOTE — H&P (Signed)
History and Physical    Samuel Reyes PYP:[id number] DOB: 1956/03/08 DOA: 06/24/2017  PCP: Maren Reamer, MD Consultants:  None Patient coming from:  [place] - he is homeless; previously from [place]; his son (6 years ago) and his wife died 4 years ago; NOK: sister, [phone]  Chief Complaint: SOB  HPI: Samuel Reyes is a 61 y.o. male with medical history significant of homelessness; remote CVA and MI; HTN; depression; COPD; and ETOH dependence presenting with cough and SOB.  Patient has "just about coughed myself to death.  Hurting, my chest, my head."  He has also had blurry vision.  Symptoms have been going on for a couple of weeks.  Seen 10 days ago for COPD exacerbation - given prednisone and azithromycin.  Medications helped a little but then the symptoms came back worse.  He has never had this before.  Mild fevers up to 102, last about a week ago.  +SOB with ambulation.  Mild ankle edema.  Lives at the shelter, several folks with illness.  When asked about TB, "that's what they keep telling me down there, to get checked for that.  Cause I keep them awake at night coughing."  Reports no ETOH today.  7 beers yesterday.  +h/o ETOH withdrawal - tremors, GI upset.  Denies seizures.  +auditory hallucinations, no visual.  ED Course:  ETOH+.  Albuterol x 1, Duoneb x 2, sounds worse.  Maintaining O2 sats, but increased WOB.  Failed outpatient management once. Negative CXR.  Doxycycline, Solumedrol given.  Review of Systems: As per HPI; otherwise review of systems reviewed and negative.   Ambulatory Status:  Ambulates without assistance but stops to rest a lot  Past Medical History:  Diagnosis Date  . Alcoholism ([place])   . CAD (coronary artery disease) 2002   MI, no intervention required  . COPD (chronic obstructive pulmonary disease) (HCC)    not on home O2  . Depression   . GERD (gastroesophageal reflux disease)   . Headache   . Hypertension   . Stroke [facility]) 2006    Past  Surgical History:  Procedure Laterality Date  . BACK SURGERY     3 cervical spine surgeries C4-C5 fused  . COLONOSCOPY N/A 01/04/2014   Procedure: COLONOSCOPY;  Surgeon: Danie Binder, MD;  Location: [facility];  Service: Endoscopy;  Laterality: N/A;  1:45  . ESOPHAGOGASTRODUODENOSCOPY N/A 01/04/2014   Procedure: ESOPHAGOGASTRODUODENOSCOPY (EGD);  Surgeon: Danie Binder, MD;  Location: [facility];  Service: Endoscopy;  Laterality: N/A;  . FINGER SURGERY Left    2nd, 3rd, & 4th fingers were cut off by table saw and reattached  . GASTRECTOMY    . HERNIA REPAIR    . INCISIONAL HERNIA REPAIR N/A 01/20/2014   Procedure: LAPAROSCOPIC RECURRENT  INCISIONAL HERNIA with mesh;  Surgeon: Edward Jolly, MD;  Location: [facility]S;  Service: General;  Laterality: N/A;  . rt knee arthroscopic surgery    . SHOULDER SURGERY Bilateral    3 surgeries on on left, 2 surgeries on right     Social History   Socioeconomic History  . Marital status: Widowed    Spouse name: Not on file  . Number of children: Not on file  . Years of education: Not on file  . Highest education level: Not on file  Social Needs  . Financial resource strain: Not on file  . Food insecurity - worry: Not on file  . Food insecurity - inability: Not on file  .  Transportation needs - medical: Not on file  . Transportation needs - non-medical: Not on file  Occupational History  . Occupation: Disability  Tobacco Use  . Smoking status: Current Every Day Smoker    Packs/day: 2.00    Years: 52.00    Pack years: 104.00    Types: Cigarettes    Start date: 07/30/1966  . Smokeless tobacco: Never Used  . Tobacco comment: down to 1/4 ppd  Substance and Sexual Activity  . Alcohol use: Yes    Alcohol/week: 1.8 - 2.4 oz    Types: 3 - 4 Cans of beer per week    Comment: everyday- 3-4 40 oz  . Drug use: No    Comment: denied using any drugs  . Sexual activity: No  Other Topics Concern  . Not on file  Social History Narrative  .  Not on file    Allergies  Allergen Reactions  . Bee Venom Anaphylaxis  . Penicillins Rash    Has patient had a PCN reaction causing immediate rash, facial/tongue/throat swelling, SOB or lightheadedness with hypotension: {Yes Has patient had a PCN reaction causing severe rash involving mucus membranes or skin necrosis: YES Has patient had a PCN reaction that required hospitalization Yes Has patient had a PCN reaction occurring within the last 10 years: YES If all of the above answers are "NO", then may proceed with Cephalosporin use.     Family History  Problem Relation Age of Onset  . Cancer Father        bone  . Cancer Brother        lungs  . Stroke Maternal Grandmother   . Asthma Son        died at age 67 in his sleep   . Spina bifida Son        died at age 59   . Dementia Mother   . Colon cancer Neg Hx     Prior to Admission medications   Medication Sig Start Date End Date Taking? Authorizing Provider  albuterol (PROVENTIL HFA;VENTOLIN HFA) 108 (90 Base) MCG/ACT inhaler Inhale 1-2 puffs into the lungs every 4 (four) hours as needed for wheezing. 08/29/16  Yes Arnoldo Morale, MD  metoprolol (LOPRESSOR) 50 MG tablet Take 1 tablet (50 mg total) by mouth 2 (two) times daily. 09/03/16  Yes Arnoldo Morale, MD  traZODone (DESYREL) 100 MG tablet Take 1 tablet (100 mg total) by mouth at bedtime as needed for sleep. Patient taking differently: Take 100 mg at bedtime by mouth.  09/03/16  Yes Arnoldo Morale, MD  azithromycin (ZITHROMAX) 250 MG tablet Take 1 tablet (250 mg total) daily by mouth. Patient not taking: Reported on 06/24/2017 06/14/17   Carlisle Cater, PA-C    Physical Exam: Vitals:   06/24/17 2100 06/24/17 2200 06/24/17 2230 06/24/17 2348  BP: 99/67 114/69 115/68 (!) 113/54  Pulse: 93 87 94 (!) 105  Resp: (!) 23 20 20    Temp:      TempSrc:      SpO2: 97% 98% 100%   Weight:      Height:         General:  Appears fairly comfortable on continuous neb treatment Eyes:   PERRL, EOMI, normal lids, iris ENT:  grossly normal hearing, lips & tongue, mmm; edentulous Neck:  no LAD, masses or thyromegaly Cardiovascular:  RRR, no m/r/g. No LE edema.  Respiratory:   Mild scattered wheezes with moderate air movement.   Mildly increased respiratory effort. Abdomen:  soft, NT, ND,  NABS Skin:  no rash or induration seen on limited exam Musculoskeletal:  grossly normal tone BUE/BLE, good ROM, no bony abnormality Psychiatric:  blunted mood and affect, speech fluent and appropriate, AOx3 Neurologic:  CN 2-12 grossly intact, moves all extremities in coordinated fashion, sensation intact    Radiological Exams on Admission: Dg Chest 2 View  Result Date: 06/24/2017 CLINICAL DATA:  Chest pain and shortness of breath EXAM: CHEST  2 VIEW COMPARISON:  06/14/2017 FINDINGS: Normal heart size and mediastinal contours. There is no edema, consolidation, effusion, or pneumothorax. Chronic airway thickening. There were multiple centrilobular/tree-in-bud nodules by 08/21/2016 chest CT, largely occult by radiography. Surgical clips at the GE junction. Remote right anterior sixth and seventh rib fractures. No acute osseous finding. IMPRESSION: 1. No acute finding when compared to prior. 2. Chronic bronchitic markings. Electronically Signed   By: Monte Fantasia M.D.   On: 06/24/2017 17:47    EKG: Independently reviewed.  NSR with rate 98, prolonged QT 506, no evidence of acute ischemia   Labs on Admission: I have personally reviewed the available labs and imaging studies at the time of the admission.  Pertinent labs:   K+ 3.3 Albumin 3.1 - stable Negative BNP, troponin WBC 13.0 - improved from 10/19 Hgb 11.0; 12.5 on 10/19 ETOH 155 UA negative   Assessment/Plan Principal Problem:   Acute respiratory failure with hypoxia (HCC) Active Problems:   Benign essential HTN   COPD exacerbation (HCC)   Alcohol use disorder, severe, dependence (HCC)   Malnutrition of moderate degree    Tobacco abuse   Homelessness   Acute respiratory failure with hypoxia -Patient with extensive tobacco history presenting with persistent cough and SOB, refractory to prior treatment with prednisone and azithromycin -While likely COPD exacerbation, the patient is homeless and alcohol dependent and previously (1/18) had centrolobular tree-in-bud nodular opacities on CT; these can be associated with mycobacterium infections (39% in 1 study).  Will place in a negative pressure room on airborne precautions and order quantiferon gold as well as AFB smears and cultures x 3. -Symptoms are more likely caused by acute COPD exacerbation.  -He was previously treated with azithromycin and prednisone with mild improvement but immediate recurrent of symptoms upon stopping medications. -He reports prior fever but none presently; leukocytosis is improved. Chest x-ray is not consistent with pneumonia (and there are no current findings suggestive of TB). -He was given a continuous neb treatment in the ED with improvement. -will admit patient - with his failure of outpatient therapy and marked wheezing, it seems likely that he will need several days of hospitalization to show sufficient improvement for discharge. -Nebulizers: scheduled Duoneb and prn albuterol -He was given Solumedrol in the ER, but given concern for possible TB will hold further steroids for now. -IV Doxycycline  ETOH dependence  -Patient with chronic ETOH dependence -ETOH level 155 yet he reports no ETOH today; if true, he drank quite excessively yesterday -He is at high risk for complications of withdrawal including seizures, DTs -Will observe -CIWA protocol  Tobacco dependence -Encourage cessation.  This was discussed with the patient and should be reviewed on an ongoing basis.   -Patch ordered at patient request.  HTN -Continue Lopressor  Malnutrition -Likely related to homelessness and ETOH dependence -Consider nutrition consult  during stay  Homelessness -SW consult   DVT prophylaxis:  Lovenox  Code Status:  DNR - confirmed with patient Family Communication: None present Disposition Plan:  Home once clinically improved Consults called:  CM/RT/Nutrition/SW/PT/OT Admission status:  Admit - It is my clinical opinion that admission to INPATIENT is reasonable and necessary because this patient will require at least 2 midnights in the hospital to treat this condition based on the medical complexity of the problems presented.  Given the aforementioned information, the predictability of an adverse outcome is felt to be significant.    Karmen Bongo MD [facility]  If note is complete, please contact covering daytime or nighttime physician. [email or web address] Password [email or web address]  06/24/2017, 11:59 PM

## 2017-06-25 ENCOUNTER — Encounter (HOSPITAL_COMMUNITY): Payer: Self-pay | Admitting: Nurse Practitioner

## 2017-06-25 ENCOUNTER — Other Ambulatory Visit: Payer: Self-pay

## 2017-06-25 ENCOUNTER — Telehealth: Payer: Self-pay

## 2017-06-25 LAB — BASIC METABOLIC PANEL
ANION GAP: 11 (ref 5–15)
BUN: 12 mg/dL (ref 6–20)
CHLORIDE: 104 mmol/L (ref 101–111)
CO2: 21 mmol/L — AB (ref 22–32)
Calcium: 8.6 mg/dL — ABNORMAL LOW (ref 8.9–10.3)
Creatinine, Ser: 0.97 mg/dL (ref 0.61–1.24)
GFR calc Af Amer: >60 mL/min (ref >60)
GLUCOSE: 275 mg/dL — AB (ref 65–99)
POTASSIUM: 4.3 mmol/L (ref 3.5–5.1)
Sodium: 136 mmol/L (ref 135–145)

## 2017-06-25 LAB — CBC
HEMATOCRIT: 30.9 % — AB (ref 39.0–52.0)
HEMOGLOBIN: 9.8 g/dL — AB (ref 13.0–17.0)
MCH: 25.6 pg — AB (ref 26.0–34.0)
MCHC: 31.7 g/dL (ref 30.0–36.0)
MCV: 80.7 fL (ref 78.0–100.0)
Platelets: 393 10*3/uL (ref 150–400)
RBC: 3.83 MIL/uL — ABNORMAL LOW (ref 4.22–5.81)
RDW: 16.7 % — ABNORMAL HIGH (ref 11.5–15.5)
WBC: 11.1 10*3/uL — ABNORMAL HIGH (ref 4.0–10.5)

## 2017-06-25 LAB — MAGNESIUM: Magnesium: 1.7 mg/dL (ref 1.7–2.4)

## 2017-06-25 LAB — MRSA PCR SCREENING: MRSA by PCR: NEGATIVE

## 2017-06-25 MED ORDER — ALUM & MAG HYDROXIDE-SIMETH 200-200-20 MG/5ML PO SUSP
5.0000 mL | Freq: Four times a day (QID) | ORAL | Status: DC | PRN
  Administered 2017-06-25 – 2017-06-27 (×2): 10 mL via ORAL
  Filled 2017-06-25 (×2): qty 30

## 2017-06-25 MED ORDER — DOCUSATE SODIUM 100 MG PO CAPS
100.0000 mg | ORAL_CAPSULE | Freq: Two times a day (BID) | ORAL | Status: DC
  Administered 2017-06-25 – 2017-07-01 (×13): 100 mg via ORAL
  Filled 2017-06-25 (×13): qty 1

## 2017-06-25 MED ORDER — ACETAMINOPHEN 325 MG PO TABS
650.0000 mg | ORAL_TABLET | Freq: Four times a day (QID) | ORAL | Status: DC | PRN
  Administered 2017-06-25 – 2017-07-01 (×5): 650 mg via ORAL
  Filled 2017-06-25 (×5): qty 2

## 2017-06-25 MED ORDER — METOPROLOL TARTRATE 50 MG PO TABS
50.0000 mg | ORAL_TABLET | Freq: Two times a day (BID) | ORAL | Status: DC
  Administered 2017-06-25 – 2017-07-01 (×13): 50 mg via ORAL
  Filled 2017-06-25 (×13): qty 1

## 2017-06-25 MED ORDER — IPRATROPIUM-ALBUTEROL 0.5-2.5 (3) MG/3ML IN SOLN
3.0000 mL | Freq: Three times a day (TID) | RESPIRATORY_TRACT | Status: DC
  Administered 2017-06-26 – 2017-06-28 (×7): 3 mL via RESPIRATORY_TRACT
  Filled 2017-06-25 (×7): qty 3

## 2017-06-25 MED ORDER — ALBUTEROL SULFATE (2.5 MG/3ML) 0.083% IN NEBU: 2.5000 mg | INHALATION_SOLUTION | RESPIRATORY_TRACT | Status: DC | PRN

## 2017-06-25 MED ORDER — INFLUENZA VAC SPLIT QUAD 0.5 ML IM SUSY
0.5000 mL | PREFILLED_SYRINGE | INTRAMUSCULAR | Status: AC
  Administered 2017-06-26: 0.5 mL via INTRAMUSCULAR
  Filled 2017-06-25: qty 0.5

## 2017-06-25 MED ORDER — DOXYCYCLINE HYCLATE 100 MG IV SOLR
100.0000 mg | Freq: Two times a day (BID) | INTRAVENOUS | Status: DC
  Administered 2017-06-25 – 2017-06-27 (×5): 100 mg via INTRAVENOUS
  Filled 2017-06-25 (×5): qty 100

## 2017-06-25 MED ORDER — IPRATROPIUM-ALBUTEROL 0.5-2.5 (3) MG/3ML IN SOLN
3.0000 mL | Freq: Four times a day (QID) | RESPIRATORY_TRACT | Status: DC
  Administered 2017-06-25 (×4): 3 mL via RESPIRATORY_TRACT
  Filled 2017-06-25 (×4): qty 3

## 2017-06-25 MED ORDER — ACETAMINOPHEN 650 MG RE SUPP: 650.0000 mg | Freq: Four times a day (QID) | RECTAL | Status: DC | PRN

## 2017-06-25 MED ORDER — ENOXAPARIN SODIUM 40 MG/0.4ML ~~LOC~~ SOLN
40.0000 mg | SUBCUTANEOUS | Status: DC
  Administered 2017-06-25 – 2017-07-01 (×7): 40 mg via SUBCUTANEOUS
  Filled 2017-06-25 (×7): qty 0.4

## 2017-06-25 MED ORDER — ONDANSETRON HCL 4 MG/2ML IJ SOLN: 4.0000 mg | Freq: Four times a day (QID) | INTRAMUSCULAR | Status: DC | PRN

## 2017-06-25 MED ORDER — ORAL CARE MOUTH RINSE
15.0000 mL | Freq: Two times a day (BID) | OROMUCOSAL | Status: DC
  Administered 2017-06-26 – 2017-06-30 (×7): 15 mL via OROMUCOSAL

## 2017-06-25 MED ORDER — HYDROCODONE-ACETAMINOPHEN 5-325 MG PO TABS
1.0000 | ORAL_TABLET | Freq: Four times a day (QID) | ORAL | Status: DC | PRN
  Administered 2017-06-25 – 2017-06-29 (×6): 1 via ORAL
  Filled 2017-06-25 (×6): qty 1

## 2017-06-25 MED ORDER — ENSURE ENLIVE PO LIQD
237.0000 mL | Freq: Two times a day (BID) | ORAL | Status: DC
  Administered 2017-06-26 – 2017-07-01 (×9): 237 mL via ORAL

## 2017-06-25 MED ORDER — ONDANSETRON HCL 4 MG PO TABS: 4.0000 mg | ORAL_TABLET | Freq: Four times a day (QID) | ORAL | Status: DC | PRN

## 2017-06-25 MED ORDER — NICOTINE 14 MG/24HR TD PT24
14.0000 mg | MEDICATED_PATCH | Freq: Every day | TRANSDERMAL | Status: DC
  Administered 2017-06-25 – 2017-07-01 (×7): 14 mg via TRANSDERMAL
  Filled 2017-06-25 (×7): qty 1

## 2017-06-25 MED ORDER — CHLORHEXIDINE GLUCONATE 0.12 % MT SOLN
15.0000 mL | Freq: Two times a day (BID) | OROMUCOSAL | Status: DC
  Administered 2017-06-25 – 2017-07-01 (×11): 15 mL via OROMUCOSAL
  Filled 2017-06-25 (×12): qty 15

## 2017-06-25 MED ORDER — TRAZODONE HCL 100 MG PO TABS
100.0000 mg | ORAL_TABLET | Freq: Every day | ORAL | Status: DC
  Administered 2017-06-25 – 2017-06-30 (×7): 100 mg via ORAL
  Filled 2017-06-25 (×7): qty 1

## 2017-06-25 NOTE — Progress Notes (Signed)
Initial Nutrition Assessment  DOCUMENTATION CODES:   Not applicable  INTERVENTION:    Provide MVI daily  Ensure Enlive po BID, each supplement provides 350 kcal and 20 grams of protein  NUTRITION DIAGNOSIS:   Inadequate oral intake related to decreased appetite, social / environmental circumstances as evidenced by per patient/family report.  GOAL:   Patient will meet greater than or equal to 90% of their needs  MONITOR:   PO intake, Supplement acceptance, Weight trends, Labs  REASON FOR ASSESSMENT:   Consult Assessment of nutrition requirement/status  ASSESSMENT:   Pt with PMH significant for homelessness, CVA, MI, HTN, COPD, and alcohol abuse. Presents this admission with acute respiratory failure with hypoxia and ETOH dependence.    Spoke with pt at bedside.  Reports having decreased PO intake for the last two weeks, consuming only bites at meals.  The homeless shelter provide pt with three meals per day, but pt not up to eating.  Prior to these two weeks pt did not have a place to stay and would eat every 2-3 days.  Pt admits to drinking, the last time being two days ago after he thought about his son who recently passed.RD observed breakfast tray with 50% eaten.  Pt amendable to supplementation this stay.  States he has lost 12 lb in a one month time span. Records indicate pt has maintained his weight of 138 lb since February. Do not suspect significant weight loss.  Nutrition-Focused physical exam completed.   Medications reviewed and include: colace, folic acid, MVI with minerals, IV abx Labs reviewed: CBG 275 (H)   NUTRITION - FOCUSED PHYSICAL EXAM:    Most Recent Value  Orbital Region  No depletion  Upper Arm Region  No depletion  Thoracic and Lumbar Region  Unable to assess  Buccal Region  No depletion  Temple Region  Moderate depletion  Clavicle Bone Region  Moderate depletion  Clavicle and Acromion Bone Region  No depletion  Scapular Bone Region   Unable to assess  Dorsal Hand  No depletion  Patellar Region  No depletion  Anterior Thigh Region  No depletion  Posterior Calf Region  No depletion  Edema (RD Assessment)  None  Hair  Reviewed  Eyes  Reviewed  Mouth  Reviewed  Skin  Reviewed  Nails  Reviewed       Diet Order:  Diet regular Room service appropriate? Yes; Fluid consistency: Thin  EDUCATION NEEDS:   Education needs have been addressed  Skin:  Skin Assessment: Reviewed RN Assessment  Last BM:  PTA  Height:   Ht Readings from Last 1 Encounters:  06/25/17 5\' 4"  (1.626 m)    Weight:   Wt Readings from Last 1 Encounters:  06/25/17 138 lb 14.2 oz (63 kg)    Ideal Body Weight:  59.1 kg  BMI:  Body mass index is 23.84 kg/m.  Estimated Nutritional Needs:   Kcal:  1700-1900 kcal/day  Protein:  80-90 g/day  Fluid:  >1.7 L/day    Mariana Single RD, LDN Clinical Nutrition Pager # - [phone]

## 2017-06-25 NOTE — Care Management Note (Signed)
Case Management Note  Patient Details  Name: Samuel Reyes MRN: [id number] Date of Birth: [birth year]  Subjective/Objective: 61 y/o m admitted w/Acute resp failure w/hypoxia. Hx: COPD. [facility]. From homeless shelter. pcp-[facility]. 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 Will ask [facility] to f/u w/ pcp hospital appt. CSW following-see note. PT/OT-supv.Will need bus pass @ d/c.                   Action/Plan:d/c plan shelter.   Expected Discharge Date:  (unknown)               Expected Discharge Plan:  Homeless Shelter  In-House Referral:  Clinical Social Work  Discharge planning Services  CM Consult, [facility]  Post Acute Care Choice:    Choice offered to:     DME Arranged:    DME Agency:     HH Arranged:    [place] Agency:     Status of Service:  In process, will continue to follow  If discussed at Long Length of Stay Meetings, dates discussed:    Additional Comments:  Dessa Phi, RN 06/25/2017, 2:57 PM

## 2017-06-25 NOTE — Progress Notes (Signed)
PROGRESS NOTE    Samuel Reyes  WUJ:[id number] DOB: Sep 30, 1955 DOA: 06/24/2017 PCP: Maren Reamer, MD   Brief Narrative:  61 y.o. male with medical history significant of homelessness; remote CVA and MI; HTN; depression; COPD; and ETOH dependence presenting with cough and SOB.  Patient has "just about coughed myself to death.  Hurting, my chest, my head."  He has also had blurry vision.  Symptoms have been going on for a couple of weeks.  Seen 10 days ago for COPD exacerbation - given prednisone and azithromycin.  Medications helped a little but then the symptoms came back worse.  He has never had this before.  Mild fevers up to 102, last about a week ago.  +SOB with ambulation.  Mild ankle edema.  Lives at the shelter, several folks with illness.  When asked about TB, "that's what they keep telling me down there, to get checked for that.  Cause I keep them awake at night coughing."  Reports no ETOH today.  7 beers yesterday.  +h/o ETOH withdrawal - tremors, GI upset.  Denies seizures.  +auditory hallucinations, no visual.  ED Course:  ETOH+.  Albuterol x 1, Duoneb x 2, sounds worse.  Maintaining O2 sats, but increased WOB.  Failed outpatient management once. Negative CXR.  Doxycycline, Solumedrol given.    Assessment & Plan:   Principal Problem:   Acute respiratory failure with hypoxia (HCC) Active Problems:   Benign essential HTN   COPD exacerbation (HCC)   Alcohol use disorder, severe, dependence (HCC)   Malnutrition of moderate degree   Tobacco abuse   Homelessness   Acute hypoxic respiratory failure secondary to acute COPD exacerbation.  Patient also homeless and history of alcohol abuse more common Klebsiella and staph aureus.  Patient currently in negative pressure room.  QuantiFERON gold and AFB smears ordered.  If the comeback negative patient can be taken off isolation room.  Continue current antibiotic IV doxycycline and DuoNeb and albuterol.  Patient received  Solu-Medrol in the ER but has not been continued for the concern of possible tuberculosis.  Patient has history of tobacco dependence.  Alcohol abuse and dependence seizure precautions Siva protocol.  Hypertension continue Lopressor.  Homeless patient lives in shelter.    DVT prophylaxis: Lovenox Code Status: DNR Family Communication: No family Disposition Plan tbd Consultants: pt/ot/sw/cm  Procedures: none Antimicrobials: doxy Subjective: Feels her cough is better breathing is better   Objective: Resting in bed Vitals:   06/25/17 0218 06/25/17 0600 06/25/17 0852 06/25/17 1117  BP:  (!) 114/59  131/82  Pulse:  (!) 109  (!) 110  Resp:  20    Temp:  98.5 F (36.9 C)    TempSrc:  Oral    SpO2: 97% 96% 96% 97%  Weight:      Height:        Intake/Output Summary (Last 24 hours) at 06/25/2017 1413 Last data filed at 06/25/2017 0630 Gross per 24 hour  Intake 540 ml  Output -  Net 540 ml   Filed Weights   06/24/17 1626 06/25/17 0059  Weight: 61.2 kg (135 lb) 63 kg (138 lb 14.2 oz)    Examination:  General exam: Appears calm and comfortable  Respiratory system: scattered rhonchi auscultation. Respiratory effort normal. Cardiovascular system: S1 & S2 heard, RRR. No JVD, murmurs, rubs, gallops or clicks. No pedal edema. Gastrointestinal system: Abdomen is nondistended, soft and nontender. No organomegaly or masses felt. Normal bowel sounds heard. Central nervous system: Alert and oriented. No  focal neurological deficits. Extremities: Symmetric 5 x 5 power. Skin: No rashes, lesions or ulcers     Data Reviewed: I have personally reviewed following labs and imaging studies  CBC: Recent Labs  Lab 06/24/17 1900 06/25/17 0439  WBC 13.0* 11.1*  NEUTROABS 9.6*  --   HGB 11.0* 9.8*  HCT 34.5* 30.9*  MCV 80.2 80.7  PLT 416* 403   Basic Metabolic Panel: Recent Labs  Lab 06/24/17 1900 06/25/17 0439  NA 137 136  K 3.3* 4.3  CL 103 104  CO2 24 21*  GLUCOSE 93  275*  BUN 6 12  CREATININE 0.75 0.97  CALCIUM 8.6* 8.6*  MG  --  1.7   GFR: Estimated Creatinine Clearance: 66.1 mL/min (by C-G formula based on SCr of 0.97 mg/dL). Liver Function Tests: Recent Labs  Lab 06/24/17 1900  AST 20  ALT 13*  ALKPHOS 76  BILITOT 0.6  PROT 7.0  ALBUMIN 3.1*   Recent Labs  Lab 06/24/17 1900  LIPASE 22   No results for input(s): AMMONIA in the last 168 hours. Coagulation Profile: No results for input(s): INR, PROTIME in the last 168 hours. Cardiac Enzymes: No results for input(s): CKTOTAL, CKMB, CKMBINDEX, TROPONINI in the last 168 hours. BNP (last 3 results) No results for input(s): PROBNP in the last 8760 hours. HbA1C: No results for input(s): HGBA1C in the last 72 hours. CBG: No results for input(s): GLUCAP in the last 168 hours. Lipid Profile: No results for input(s): CHOL, HDL, LDLCALC, TRIG, CHOLHDL, LDLDIRECT in the last 72 hours. Thyroid Function Tests: No results for input(s): TSH, T4TOTAL, FREET4, T3FREE, THYROIDAB in the last 72 hours. Anemia Panel: No results for input(s): VITAMINB12, FOLATE, FERRITIN, TIBC, IRON, RETICCTPCT in the last 72 hours. Sepsis Labs: No results for input(s): PROCALCITON, LATICACIDVEN in the last 168 hours.  Recent Results (from the past 240 hour(s))  MRSA PCR Screening     Status: None   Collection Time: 06/25/17  1:14 AM  Result Value Ref Range Status   MRSA by PCR NEGATIVE NEGATIVE Final    Comment:        The GeneXpert MRSA Assay (FDA approved for NASAL specimens only), is one component of a comprehensive MRSA colonization surveillance program. It is not intended to diagnose MRSA infection nor to guide or monitor treatment for MRSA infections.          Radiology Studies: Dg Chest 2 View  Result Date: 06/24/2017 CLINICAL DATA:  Chest pain and shortness of breath EXAM: CHEST  2 VIEW COMPARISON:  06/14/2017 FINDINGS: Normal heart size and mediastinal contours. There is no edema,  consolidation, effusion, or pneumothorax. Chronic airway thickening. There were multiple centrilobular/tree-in-bud nodules by 08/21/2016 chest CT, largely occult by radiography. Surgical clips at the GE junction. Remote right anterior sixth and seventh rib fractures. No acute osseous finding. IMPRESSION: 1. No acute finding when compared to prior. 2. Chronic bronchitic markings. Electronically Signed   By: Monte Fantasia M.D.   On: 06/24/2017 17:47        Scheduled Meds: . chlorhexidine  15 mL Mouth Rinse BID  . docusate sodium  100 mg Oral BID  . enoxaparin (LOVENOX) injection  40 mg Subcutaneous Q24H  . feeding supplement (ENSURE ENLIVE)  237 mL Oral BID BM  . folic acid  1 mg Oral Daily  . [START ON 06/26/2017] Influenza vac split quadrivalent PF  0.5 mL Intramuscular Tomorrow-1000  . ipratropium-albuterol  3 mL Nebulization Q6H  . LORazepam  0-4 mg  Intravenous Q6H   Followed by  . [START ON 06/27/2017] LORazepam  0-4 mg Intravenous Q12H  . mouth rinse  15 mL Mouth Rinse q12n4p  . metoprolol tartrate  50 mg Oral BID  . multivitamin with minerals  1 tablet Oral Daily  . nicotine  14 mg Transdermal Daily  . thiamine  100 mg Oral Daily   Or  . thiamine  100 mg Intravenous Daily  . traZODone  100 mg Oral QHS   Continuous Infusions: . albuterol 10 mg/hr (06/24/17 2139)  . doxycycline (VIBRAMYCIN) IV Stopped (06/25/17 0830)     LOS: 1 day     Georgette Shell, MD [facility] If 7PM-7AM, please contact night-coverage [email or web address] Password [email or web address] 06/25/2017, 2:13 PM

## 2017-06-25 NOTE — Evaluation (Signed)
Physical Therapy Evaluation Patient Details Name: Samuel Reyes MRN: [id number] DOB: 1956/01/24 Today's Date: 06/25/2017   History of Present Illness  61 y.o. male with medical history significant of homelessness; remote CVA and MI; HTN; depression; COPD; and ETOH dependence presenting with cough and SOB; admitted for acute respiratory failure  Clinical Impression  Pt admitted with above diagnosis. Pt currently with functional limitations due to the deficits listed below (see PT Problem List).  Pt will benefit from skilled PT to increase their independence and safety with mobility to allow discharge to the venue listed below.  Pt ambulated in room holding IV pole with some unsteadiness however he reports he has access to his RW at his sister's.  SPO2 after ambulating on room air 94% however HR elevated to 128 (RN notified).  Pt from homeless shelter so will continue to assist with mobility in hospital however anticipate pt to be closer to his baseline upon d/c.      Follow Up Recommendations Supervision for mobility/OOB    Equipment Recommendations  None recommended by PT    Recommendations for Other Services       Precautions / Restrictions Precautions Precaution Comments: monitor HR and sats Restrictions Weight Bearing Restrictions: No      Mobility  Bed Mobility Overal bed mobility: Needs Assistance Bed Mobility: Supine to Sit     Supine to sit: Supervision;HOB elevated        Transfers Overall transfer level: Needs assistance Equipment used: None Transfers: Sit to/from Stand Sit to Stand: Min guard         General transfer comment: min/guard for safety, mildly unsteady with rise however self corrected  Ambulation/Gait Ambulation/Gait assistance: Min guard Ambulation Distance (Feet): 16 Feet(x2) Assistive device: None Gait Pattern/deviations: Step-through pattern;Decreased stride length     General Gait Details: pt pushed IV pole, ambulated in room and  then set up for breakfast in recliner, SpO2 94% on room air however HR elevated to 128 bpm  Stairs            Wheelchair Mobility    Modified Rankin (Stroke Patients Only)       Balance Overall balance assessment: Needs assistance         Standing balance support: No upper extremity supported Standing balance-Leahy Scale: Fair                               Pertinent Vitals/Pain Pain Assessment: Faces Faces Pain Scale: Hurts even more Pain Location: head - believes from increased coughing Pain Descriptors / Indicators: Aching;Pressure Pain Intervention(s): Monitored during session;Limited activity within patient's tolerance    Home Living Family/patient expects to be discharged to:: Shelter/Homeless                      Prior Function Level of Independence: Independent         Comments: pt reports being from [place], states his sister has his RW and [facility]     Hand Dominance        Extremity/Trunk Assessment        Lower Extremity Assessment Lower Extremity Assessment: Generalized weakness       Communication   Communication: HOH  Cognition Arousal/Alertness: Awake/alert Behavior During Therapy: WFL for tasks assessed/performed Overall Cognitive Status: Within Functional Limits for tasks assessed  General Comments      Exercises     Assessment/Plan    PT Assessment Patient needs continued PT services  PT Problem List Decreased strength;Decreased mobility;Decreased activity tolerance;Decreased balance       PT Treatment Interventions Gait training;DME instruction;Therapeutic activities;Therapeutic exercise;Functional mobility training;Patient/family education;Balance training    PT Goals (Current goals can be found in the Care Plan section)  Acute Rehab PT Goals PT Goal Formulation: With patient Time For Goal Achievement: 07/02/17 Potential to Achieve  Goals: Good    Frequency Min 3X/week   Barriers to discharge        Co-evaluation               AM-PAC PT "6 Clicks" Daily Activity  Outcome Measure Difficulty turning over in bed (including adjusting bedclothes, sheets and blankets)?: None Difficulty moving from lying on back to sitting on the side of the bed? : None Difficulty sitting down on and standing up from a chair with arms (e.g., wheelchair, bedside commode, etc,.)?: A Lot Help needed moving to and from a bed to chair (including a wheelchair)?: A Little Help needed walking in hospital room?: A Little Help needed climbing 3-5 steps with a railing? : A Lot 6 Click Score: 18    End of Session   Activity Tolerance: Patient tolerated treatment well Patient left: in chair;with call bell/phone within reach Nurse Communication: Mobility status PT Visit Diagnosis: Difficulty in walking, not elsewhere classified (R26.2)    Time: 4431-5400 PT Time Calculation (min) (ACUTE ONLY): 15 min   Charges:   PT Evaluation $PT Eval Low Complexity: 1 Low     PT G CodesCarmelia Bake, PT, DPT 06/25/2017 Pager: [phone]   [name] 06/25/2017, 11:11 AM

## 2017-06-25 NOTE — Progress Notes (Signed)
Patient requesting Maalox or gi cocktail for indigestion. Dr. Zigmund Daniel notified via text page.

## 2017-06-25 NOTE — Telephone Encounter (Addendum)
Message received from Dessa Phi, RN CM requesting a hospital follow up appointment for the patient at [facility]. Informed her that there are not any hospital follow up appointments available at this time at [facility].

## 2017-06-25 NOTE — Progress Notes (Signed)
Sent a Sputum (AFB) and a MRSA by PCR swab to the lab.

## 2017-06-25 NOTE — Evaluation (Signed)
Occupational Therapy Evaluation Patient Details Name: Samuel Reyes MRN: [id number] DOB: 1956/03/25 Today's Date: 06/25/2017    History of Present Illness 61 y.o. male with medical history significant of homelessness; remote CVA and MI; HTN; depression; COPD; and ETOH dependence presenting with cough and SOB; admitted for acute respiratory failure   Clinical Impression   Pt was admitted for the above.  He states he had assistance with adls "from whoever" was available prior to admission.  Pt was limited by fatique during evaluation. Will follow in acute setting with supervision level goals. He needed min A for SPT and up to mod A for LB adls at time of assessment    Follow Up Recommendations  Supervision - Intermittent(likely)    Equipment Recommendations  None recommended by OT(likely)    Recommendations for Other Services       Precautions / Restrictions Precautions Precaution Comments: monitor HR and sats Restrictions Weight Bearing Restrictions: No      Mobility Bed Mobility Overal bed mobility: Needs Assistance Bed Mobility: Supine to Sit     Supine to sit: Supervision;HOB elevated Sit to supine: Supervision   General bed mobility comments: assist for lines  Transfers Overall transfer level: Needs assistance Equipment used: None Transfers: Sit to/from Stand Sit to Stand: Min assist         General transfer comment: assist to steady when standing and pivoting     Balance Overall balance assessment: Needs assistance         Standing balance support: No upper extremity supported Standing balance-Leahy Scale: (initially poor; progressed to fair)                             ADL either performed or assessed with clinical judgement   ADL Overall ADL's : Needs assistance/impaired Eating/Feeding: Independent   Grooming: Set up;Sitting   Upper Body Bathing: Set up;Sitting   Lower Body Bathing: Moderate assistance;Sit to/from stand    Upper Body Dressing : Set up;Sitting   Lower Body Dressing: Moderate assistance;Sit to/from stand   Toilet Transfer: Minimal assistance;Stand-pivot(to bed)   Toileting- Clothing Manipulation and Hygiene: Minimal assistance;Sit to/from stand         General ADL Comments: pt up in chair and slid down, to recline as much as he could. ADLs limited by fatique/weakness.  Assisted back to bed     Vision         Perception     Praxis      Pertinent Vitals/Pain Pain Assessment: Faces Faces Pain Scale: Hurts even more Pain Location: head and throat Pain Descriptors / Indicators: Aching;Pressure Pain Intervention(s): Limited activity within patient's tolerance;Monitored during session;Premedicated before session;Repositioned;Patient requesting pain meds-RN notified     Hand Dominance     Extremity/Trunk Assessment Upper Extremity Assessment Upper Extremity Assessment: Generalized weakness          Communication Communication Communication: HOH   Cognition Arousal/Alertness: Awake/alert Behavior During Therapy: WFL for tasks assessed/performed Overall Cognitive Status: Within Functional Limits for tasks assessed                                     General Comments       Exercises     Shoulder Instructions      Home Living Family/patient expects to be discharged to:: Shelter/Homeless  Prior Functioning/Environment Level of Independence: Independent        Comments: pt states he has whoever is available help him as needed        OT Problem List: Decreased strength;Decreased activity tolerance;Impaired balance (sitting and/or standing);Pain;Decreased knowledge of use of DME or AE      OT Treatment/Interventions: Self-care/ADL training;DME and/or AE instruction;Patient/family education;Balance training;Therapeutic activities;Energy conservation;Therapeutic exercise    OT Goals(Current  goals can be found in the care plan section) Acute Rehab OT Goals Patient Stated Goal: none stated OT Goal Formulation: With patient Time For Goal Achievement: 07/02/17 Potential to Achieve Goals: Good ADL Goals Pt Will Transfer to Toilet: with supervision;bedside commode;regular height toilet;ambulating Pt Will Perform Toileting - Clothing Manipulation and hygiene: with supervision;sit to/from stand Additional ADL Goal #1: pt will complete LB adls with supervision, taking rest breaks as neede Additional ADL Goal #2: pt will complete level one theraband exercises with supervision and handout to increase strength for adls  OT Frequency: Min 2X/week   Barriers to D/C:            Co-evaluation              AM-PAC PT "6 Clicks" Daily Activity     Outcome Measure Help from another person eating meals?: None Help from another person taking care of personal grooming?: A Little Help from another person toileting, which includes using toliet, bedpan, or urinal?: A Little Help from another person bathing (including washing, rinsing, drying)?: A Lot Help from another person to put on and taking off regular upper body clothing?: A Little Help from another person to put on and taking off regular lower body clothing?: A Lot 6 Click Score: 17   End of Session    Activity Tolerance: Patient limited by fatigue Patient left: in bed;with call bell/phone within reach;with bed alarm set  OT Visit Diagnosis: Muscle weakness (generalized) (M62.81);Unsteadiness on feet (R26.81)                Time: 3785-8850 OT Time Calculation (min): 10 min Charges:  OT General Charges $OT Visit: 1 Visit OT Evaluation $OT Eval Low Complexity: 1 Low G-Codes:     [place], OTR/L [phone] 06/25/2017  Akshara Blumenthal 06/25/2017, 1:59 PM

## 2017-06-25 NOTE — Discharge Instructions (Signed)
Transportation to healthcare appointments Medicaid Transportation: [phone]  SCAT: [place] at [phone] or toll-free at [phone] to register over the phone if you are age 62 or older. If you have a disability prohibiting you from using regular line service, you may call to request a [employer] SCAT application. The application must be completed by both you and your doctor. Once we receive a completed application, our staff will review it and determine if you are eligible.  Outpatient substance use and mental health therapy options (this is not a comprehensive list- you may call the number on your Medicaid insurance card for complete list of covered area providers): [place]  Address: [address], [place], [place] [address] Phone: [phone]  [facility] Address: [address] [place], [place] [address] Phone: [phone]  [facility] Address: [address] [place], [place] [address] Phone: [phone]  [facility] Address: [address] [place], [place] [address] Phone: [phone]

## 2017-06-25 NOTE — Clinical Social Work Note (Signed)
Clinical Social Work Assessment  Patient Details  Name: Samuel Reyes MRN: [id number] Date of Birth: [birth year]  Date of referral:  06/25/17               Reason for consult:  Housing Concerns/Homelessness                Permission sought to share information with:    Permission granted to share information::     Name::        Agency::     Relationship::     Contact Information:     Housing/Transportation Living arrangements for the past 2 months:  Homeless Astronomer) Source of Information:  Patient Patient Interpreter Needed:  None Criminal Activity/Legal Involvement Pertinent to Current Situation/Hospitalization:  No - Comment as needed Significant Relationships:  Friend Lives with:  Self Do you feel safe going back to the place where you live?  Yes Need for family participation in patient care:  No (Coment)  Care giving concerns:  Pt residing in homeless shelter- [facility]- since returned to [place] a few weeks ago (was placed in psychiatric hospital in [place] from [place] in late October, states once discharged he has had bed at [facility]). He notes that he has not seen his PCP in several months ([place]) due to having trouble getting rides.  Notes that he has "a hard time walking very far without needing rest."   Facilities manager / plan:  CSW consulted to assess homelessness issues. Pt reports transient living situation for past several months (has been issue for years per chart). Has been staying at [facility] but reports he has identified a boarding house he can afford and is making plans to move there once he can make arrangements post DC. Pt reports he drinks alcohol about 3x week, drinks 4-7 beers per use. Has been issue off and on for years as well. Reports he has had some W/D sx while in hospital. States "I really want to stop drinking and stop smoking cigarettes."  Pt has hx of mental health issues and reports he has  not followed up with therapy/psychiatry since his last inpatient treatment at [facility] in late Oct. Reports wanting to re-engage in therapy. CSW provided information re: transportation assistance and area MH/SA providers at pt's request.  Plan: Pt reports plan to move into boarding house at DC and re-engage in MH/SA treatment in community.  Employment status:  Disabled (Comment on whether or not currently receiving Disability)(receives disability) Insurance information:  Medicaid [place] PT Recommendations:  (supervision) Information / Referral to community resources:  Outpatient Substance Abuse Treatment Options, Outpatient Psychiatric Care (Comment Required)(see narrative)  Patient/Family's Response to care:  Pt engaged and appreciative of care  Patient/Family's Understanding of and Emotional Response to Diagnosis, Current Treatment, and Prognosis:  Pt demonstrates adequate understanding of his treatment and plan at this point. Likely minimizing his alcohol use based on past report and withdrawal. Shows motivation for pursuing treatment by asking CSW specific questions re: the nature of therapy.  Emotional Assessment Appearance:  Appears stated age Attitude/Demeanor/Rapport:  (engaged, appropriate) Affect (typically observed):  Accepting, Calm Orientation:  Oriented to Self, Oriented to Place, Oriented to  Time, Oriented to Situation Alcohol / Substance use:  Alcohol Use Psych involvement (Current and /or in the community):  (has had psychiatric care inpatient and outpatient in the past )  Discharge Needs  Concerns to be addressed:  Substance Abuse Concerns, Homelessness, Mental Health Concerns Readmission  within the last 30 days:  No Current discharge risk:  Homeless Barriers to Discharge:  Continued Medical Work up   [employer], LCSW 06/25/2017, 11:53 AM  [phone]

## 2017-06-26 LAB — CBC
HCT: 31.9 % — ABNORMAL LOW (ref 39.0–52.0)
Hemoglobin: 10.1 g/dL — ABNORMAL LOW (ref 13.0–17.0)
MCH: 25.5 pg — ABNORMAL LOW (ref 26.0–34.0)
MCHC: 31.7 g/dL (ref 30.0–36.0)
MCV: 80.6 fL (ref 78.0–100.0)
PLATELETS: 427 10*3/uL — AB (ref 150–400)
RBC: 3.96 MIL/uL — ABNORMAL LOW (ref 4.22–5.81)
RDW: 16.7 % — AB (ref 11.5–15.5)
WBC: 13 10*3/uL — ABNORMAL HIGH (ref 4.0–10.5)

## 2017-06-26 LAB — BASIC METABOLIC PANEL
ANION GAP: 5 (ref 5–15)
BUN: 10 mg/dL (ref 6–20)
CALCIUM: 8.4 mg/dL — AB (ref 8.9–10.3)
CO2: 28 mmol/L (ref 22–32)
Chloride: 106 mmol/L (ref 101–111)
Creatinine, Ser: 0.78 mg/dL (ref 0.61–1.24)
GLUCOSE: 93 mg/dL (ref 65–99)
Potassium: 4.2 mmol/L (ref 3.5–5.1)
SODIUM: 139 mmol/L (ref 135–145)

## 2017-06-26 LAB — ACID FAST SMEAR (AFB): ACID FAST SMEAR - AFSCU2: NEGATIVE

## 2017-06-26 LAB — ACID FAST SMEAR (AFB, MYCOBACTERIA)

## 2017-06-26 MED ORDER — ASPIRIN EC 81 MG PO TBEC
81.0000 mg | DELAYED_RELEASE_TABLET | Freq: Every day | ORAL | Status: DC
  Administered 2017-06-26 – 2017-07-01 (×6): 81 mg via ORAL
  Filled 2017-06-26 (×6): qty 1

## 2017-06-26 NOTE — Progress Notes (Signed)
Occupational Therapy Treatment Patient Details Name: Samuel Reyes MRN: [id number] DOB: December 29, 1955 Today's Date: 06/26/2017    History of present illness 61 y.o. male with medical history significant of homelessness; remote CVA and MI; HTN; depression; COPD; and ETOH dependence presenting with cough and SOB; admitted for acute respiratory failure   OT comments  Pt fatiqued and would not get OOB during OT. Worked on level one theraband exercises to tolerance.  Encouraged him to get OOB with assistance for meals.  Follow Up Recommendations  Supervision/Assistance - 24 hour;SNF    Equipment Recommendations  3 in 1 bedside commode    Recommendations for Other Services      Precautions / Restrictions Precautions Precaution Comments: monitor HR and sats Restrictions Weight Bearing Restrictions: No       Mobility Bed Mobility                  Transfers                      Balance                                           ADL either performed or assessed with clinical judgement   ADL                                         General ADL Comments: pt had spilled coffee in bed.  He was not willing to get cleaned up at time of OTs arrival due to fatique.  removed wet covers and encouraged pt to sit up for meals.  Pt was willing to perform UE exercises     Vision       Perception     Praxis      Cognition Arousal/Alertness: Awake/alert Behavior During Therapy: WFL for tasks assessed/performed Overall Cognitive Status: Within Functional Limits for tasks assessed                                          Exercises Other Exercises Other Exercises: level one theraband, 2 sets 10 horizontal abduction.  Pt reports multiple shoulder surgeries. Worked to tolerance Other Exercises: level one theraband, 1 set 10 triceps   Shoulder Instructions       General Comments      Pertinent Vitals/ Pain        Faces Pain Scale: Hurts little more Pain Location: iv site Pain Descriptors / Indicators: Burning Pain Intervention(s): (RN notified)  Home Living                                          Prior Functioning/Environment              Frequency           Progress Toward Goals  OT Goals(current goals can now be found in the care plan section)  Progress towards OT goals: (limited tx due to fatique)     Plan      Co-evaluation  AM-PAC PT "6 Clicks" Daily Activity     Outcome Measure   Help from another person eating meals?: None Help from another person taking care of personal grooming?: A Little Help from another person toileting, which includes using toliet, bedpan, or urinal?: A Little Help from another person bathing (including washing, rinsing, drying)?: A Lot Help from another person to put on and taking off regular upper body clothing?: A Little Help from another person to put on and taking off regular lower body clothing?: A Little 6 Click Score: 18    End of Session        Activity Tolerance Patient limited by fatigue   Patient Left in bed;with call bell/phone within reach;with bed alarm set   Nurse Communication          Time: 5300-5110 OT Time Calculation (min): 16 min  Charges: OT General Charges $OT Visit: 1 Visit OT Treatments $Therapeutic Exercise: 8-22 mins  Lesle Chris, OTR/L [phone] 06/26/2017   Lasya Vetter 06/26/2017, 12:35 PM

## 2017-06-26 NOTE — Progress Notes (Signed)
[facility] liason-[place]-they have been trying to reach patient-provided them w/patient's tel# in rm. They will attempt to contact patient after d/c.

## 2017-06-26 NOTE — Progress Notes (Signed)
PROGRESS NOTE    Samuel Reyes  SAY:[id number] DOB: [birth year] DOA: 06/24/2017 PCP: Maren Reamer, MD  Brief Narrative:  61 y.o.malewith medical history significant ofhomelessness; remote CVA and MI; HTN; depression; COPD; and ETOH dependence presenting with cough and SOB.Patient has "just about coughed myself to death. Hurting, my chest, my head." He has also had blurry vision. Symptoms have been going on for a couple of weeks. Seen 10 days ago for COPD exacerbation - given prednisone and azithromycin.Medications helped a little but then the symptoms came back worse. He has never had this before. Mild fevers up to 102, last about a week ago. +SOB with ambulation. Mild ankle edema. Lives at the shelter, several folks with illness. When asked about TB, "that's what they keep telling me down there, to get checked for that. Cause I keep them awake at night coughing."  Reports no ETOH today.7 beers yesterday. +h/o ETOH withdrawal - tremors, GI upset. Denies seizures. +auditory hallucinations, no visual.  ED Course:ETOH+. Albuterol x 1, Duoneb x 2, sounds worse. Maintaining O2 sats, but increased WOB. Failed outpatient management once. Negative CXR. Doxycycline, Solumedrol given.      Assessment & Plan:   Principal Problem:   Acute respiratory failure with hypoxia (HCC) Active Problems:   Benign essential HTN   COPD exacerbation (HCC)   Alcohol use disorder, severe, dependence (HCC)   Malnutrition of moderate degree   Tobacco abuse   Homelessness  Acute hypoxic respiratory failure secondary to acute COPD exacerbation.  Patient also homeless and history of alcohol abuse more common Klebsiella and staph aureus.  Patient currently in negative pressure room.  QuantiFERON gold and AFB smears ordered.  If the comeback negative patient can be taken off isolation room.  Continue current antibiotic IV doxycycline and DuoNeb and albuterol.  Patient received  Solu-Medrol in the ER but has not been continued for the concern of possible tuberculosis.  Patient has history of tobacco dependence.  Alcohol abuse and dependence seizure precautions Siva protocol.  Hypertension continue Lopressor.  Homeless patient lives in shelter.     DVT prophylaxis: lovenox Code Statusfull Family Communication:none Disposition Plan if tb test come back negative dc on po antibiotics.PT rec snf. Consultants: none  Procedures:none Antimicrobials:doxy  Subjective:complaints of feeling thirsty.no cough,sob   Objective:resting in bed watching tv Vitals:   06/26/17 0006 06/26/17 0615 06/26/17 0813 06/26/17 1300  BP: 115/75 120/80  120/85  Pulse: 89 87  80  Resp: 18 17  18   Temp: 98.2 F (36.8 C) 97.7 F (36.5 C)  97.9 F (36.6 C)  TempSrc: Oral Oral  Oral  SpO2: 96% 93% 98% 95%  Weight:      Height:        Intake/Output Summary (Last 24 hours) at 06/26/2017 1530 Last data filed at 06/26/2017 1525 Gross per 24 hour  Intake 1220 ml  Output 675 ml  Net 545 ml   Filed Weights   06/24/17 1626 06/25/17 0059  Weight: 61.2 kg (135 lb) 63 kg (138 lb 14.2 oz)    Examination:  General exam: Appears calm and comfortable  Respiratory system: Clear to auscultation. Respiratory effort normal. Cardiovascular system: S1 & S2 heard, RRR. No JVD, murmurs, rubs, gallops or clicks. No pedal edema. Gastrointestinal system: Abdomen is nondistended, soft and nontender. No organomegaly or masses felt. Normal bowel sounds heard. Central nervous system: Alert and oriented. No focal neurological deficits. Extremities: Symmetric 5 x 5 power. Skin: No rashes, lesions or ulcers Psychiatry: Judgement and insight appear  normal. Mood & affect appropriate.     Data Reviewed: I have personally reviewed following labs and imaging studies  CBC: Recent Labs  Lab 06/24/17 1900 06/25/17 0439 06/26/17 0430  WBC 13.0* 11.1* 13.0*  NEUTROABS 9.6*  --   --   HGB 11.0*  9.8* 10.1*  HCT 34.5* 30.9* 31.9*  MCV 80.2 80.7 80.6  PLT 416* 393 106*   Basic Metabolic Panel: Recent Labs  Lab 06/24/17 1900 06/25/17 0439 06/26/17 0430  NA 137 136 139  K 3.3* 4.3 4.2  CL 103 104 106  CO2 24 21* 28  GLUCOSE 93 275* 93  BUN 6 12 10   CREATININE 0.75 0.97 0.78  CALCIUM 8.6* 8.6* 8.4*  MG  --  1.7  --    GFR: Estimated Creatinine Clearance: 80.2 mL/min (by C-G formula based on SCr of 0.78 mg/dL). Liver Function Tests: Recent Labs  Lab 06/24/17 1900  AST 20  ALT 13*  ALKPHOS 76  BILITOT 0.6  PROT 7.0  ALBUMIN 3.1*   Recent Labs  Lab 06/24/17 1900  LIPASE 22   No results for input(s): AMMONIA in the last 168 hours. Coagulation Profile: No results for input(s): INR, PROTIME in the last 168 hours. Cardiac Enzymes: No results for input(s): CKTOTAL, CKMB, CKMBINDEX, TROPONINI in the last 168 hours. BNP (last 3 results) No results for input(s): PROBNP in the last 8760 hours. HbA1C: No results for input(s): HGBA1C in the last 72 hours. CBG: No results for input(s): GLUCAP in the last 168 hours. Lipid Profile: No results for input(s): CHOL, HDL, LDLCALC, TRIG, CHOLHDL, LDLDIRECT in the last 72 hours. Thyroid Function Tests: No results for input(s): TSH, T4TOTAL, FREET4, T3FREE, THYROIDAB in the last 72 hours. Anemia Panel: No results for input(s): VITAMINB12, FOLATE, FERRITIN, TIBC, IRON, RETICCTPCT in the last 72 hours. Sepsis Labs: No results for input(s): PROCALCITON, LATICACIDVEN in the last 168 hours.  Recent Results (from the past 240 hour(s))  MRSA PCR Screening     Status: None   Collection Time: 06/25/17  1:14 AM  Result Value Ref Range Status   MRSA by PCR NEGATIVE NEGATIVE Final    Comment:        The GeneXpert MRSA Assay (FDA approved for NASAL specimens only), is one component of a comprehensive MRSA colonization surveillance program. It is not intended to diagnose MRSA infection nor to guide or monitor treatment for MRSA  infections.          Radiology Studies: Dg Chest 2 View  Result Date: 06/24/2017 CLINICAL DATA:  Chest pain and shortness of breath EXAM: CHEST  2 VIEW COMPARISON:  06/14/2017 FINDINGS: Normal heart size and mediastinal contours. There is no edema, consolidation, effusion, or pneumothorax. Chronic airway thickening. There were multiple centrilobular/tree-in-bud nodules by 08/21/2016 chest CT, largely occult by radiography. Surgical clips at the GE junction. Remote right anterior sixth and seventh rib fractures. No acute osseous finding. IMPRESSION: 1. No acute finding when compared to prior. 2. Chronic bronchitic markings. Electronically Signed   By: Monte Fantasia M.D.   On: 06/24/2017 17:47        Scheduled Meds: . aspirin EC  81 mg Oral Daily  . chlorhexidine  15 mL Mouth Rinse BID  . docusate sodium  100 mg Oral BID  . enoxaparin (LOVENOX) injection  40 mg Subcutaneous Q24H  . feeding supplement (ENSURE ENLIVE)  237 mL Oral BID BM  . folic acid  1 mg Oral Daily  . ipratropium-albuterol  3 mL Nebulization  TID  . LORazepam  0-4 mg Intravenous Q6H   Followed by  . [START ON 06/27/2017] LORazepam  0-4 mg Intravenous Q12H  . mouth rinse  15 mL Mouth Rinse q12n4p  . metoprolol tartrate  50 mg Oral BID  . multivitamin with minerals  1 tablet Oral Daily  . nicotine  14 mg Transdermal Daily  . thiamine  100 mg Oral Daily   Or  . thiamine  100 mg Intravenous Daily  . traZODone  100 mg Oral QHS   Continuous Infusions: . albuterol 10 mg/hr (06/24/17 2139)  . doxycycline (VIBRAMYCIN) IV Stopped (06/26/17 1024)     LOS: 2 days    Georgette Shell, MD [facility] If 7PM-7AM, please contact night-coverage [email or web address] Password [email or web address] 06/26/2017, 3:30 PM

## 2017-06-27 DIAGNOSIS — F1020 Alcohol dependence, uncomplicated: Secondary | ICD-10-CM

## 2017-06-27 DIAGNOSIS — Z590 Homelessness: Secondary | ICD-10-CM

## 2017-06-27 DIAGNOSIS — J441 Chronic obstructive pulmonary disease with (acute) exacerbation: Principal | ICD-10-CM

## 2017-06-27 DIAGNOSIS — I10 Essential (primary) hypertension: Secondary | ICD-10-CM

## 2017-06-27 DIAGNOSIS — D649 Anemia, unspecified: Secondary | ICD-10-CM

## 2017-06-27 LAB — QUANTIFERON-TB GOLD PLUS: QUANTIFERON-TB GOLD PLUS: UNDETERMINED

## 2017-06-27 LAB — QUANTIFERON-TB GOLD PLUS (RQFGPL)
QUANTIFERON NIL VALUE: 0.02 [IU]/mL
QUANTIFERON TB1 AG VALUE: 0.03 [IU]/mL
QUANTIFERON TB2 AG VALUE: 0.02 [IU]/mL
QuantiFERON Mitogen Value: 0.16 IU/mL

## 2017-06-27 LAB — HEMOGLOBIN A1C
HEMOGLOBIN A1C: 5.4 % (ref 4.8–5.6)
MEAN PLASMA GLUCOSE: 108 mg/dL

## 2017-06-27 MED ORDER — DOXYCYCLINE HYCLATE 100 MG PO TABS
100.0000 mg | ORAL_TABLET | Freq: Two times a day (BID) | ORAL | Status: AC
  Administered 2017-06-27 – 2017-06-29 (×5): 100 mg via ORAL
  Filled 2017-06-27 (×5): qty 1

## 2017-06-27 MED ORDER — TUBERCULIN PPD 5 UNIT/0.1ML ID SOLN
5.0000 [IU] | Freq: Once | INTRADERMAL | Status: AC
  Administered 2017-06-28: 5 [IU] via INTRADERMAL
  Filled 2017-06-27: qty 0.1

## 2017-06-27 NOTE — Progress Notes (Signed)
Physical Therapy Treatment Patient Details Name: Samuel Reyes MRN: [id number] DOB: 1955-11-22 Today's Date: 06/27/2017    History of Present Illness 61 y.o. male with medical history significant of homelessness; remote CVA and MI; HTN; depression; COPD; and ETOH dependence presenting with cough and SOB; admitted for acute respiratory failure    PT Comments    Pt donned his socks in bed (only assist to hold lines required).  Pt then ambulated around room until fatigued which happened within short distance.  Follow Up Recommendations  Supervision for mobility/OOB    Equipment Recommendations  None recommended by PT    Recommendations for Other Services       Precautions / Restrictions      Mobility  Bed Mobility   Bed Mobility: Supine to Sit;Sit to Supine     Supine to sit: Supervision Sit to supine: Supervision   General bed mobility comments: assist for lines  Transfers Overall transfer level: Needs assistance Equipment used: None Transfers: Sit to/from Stand Sit to Stand: Min guard         General transfer comment: min/guard for safety  Ambulation/Gait Ambulation/Gait assistance: Min guard Ambulation Distance (Feet): 16 Feet(x2) Assistive device: None Gait Pattern/deviations: Step-through pattern;Decreased stride length Gait velocity: decreased   General Gait Details: pt pushed IV pole with both hands 16 feet and then only one hand 16 feet, pt reports fatigue and requested return to supine   Stairs            Wheelchair Mobility    Modified Rankin (Stroke Patients Only)       Balance                                            Cognition Arousal/Alertness: Awake/alert Behavior During Therapy: WFL for tasks assessed/performed Overall Cognitive Status: Within Functional Limits for tasks assessed                                        Exercises      General Comments        Pertinent Vitals/Pain  Pain Assessment: Faces Faces Pain Scale: Hurts even more Pain Location: "heartburn" Pain Intervention(s): Other (comment)(pt requesting Maalox GI cocktail, RN notified)    Home Living                      Prior Function            PT Goals (current goals can now be found in the care plan section) Progress towards PT goals: Progressing toward goals    Frequency    Min 3X/week      PT Plan Current plan remains appropriate    Co-evaluation              AM-PAC PT "6 Clicks" Daily Activity  Outcome Measure  Difficulty turning over in bed (including adjusting bedclothes, sheets and blankets)?: None Difficulty moving from lying on back to sitting on the side of the bed? : None Difficulty sitting down on and standing up from a chair with arms (e.g., wheelchair, bedside commode, etc,.)?: A Little Help needed moving to and from a bed to chair (including a wheelchair)?: A Little Help needed walking in hospital room?: A Little Help needed climbing 3-5 steps with a railing? : A  Lot 6 Click Score: 19    End of Session   Activity Tolerance: Patient tolerated treatment well Patient left: in bed;with call bell/phone within reach   PT Visit Diagnosis: Difficulty in walking, not elsewhere classified (R26.2)     Time: 7737-3668 PT Time Calculation (min) (ACUTE ONLY): 12 min  Charges:  $Gait Training: 8-22 mins                    G Codes:       Carmelia Bake, PT, DPT 06/27/2017 Pager: [phone]  [name] 06/27/2017, 4:10 PM

## 2017-06-27 NOTE — Progress Notes (Signed)
PROGRESS NOTE    Trip Cavanagh  MPN:[id number] DOB: 1956/03/14 DOA: 06/24/2017 PCP: Maren Reamer, MD  Brief Narrative:  61 y.o.malewith medical history significant ofhomelessness; remote CVA and MI; HTN; depression; COPD; and ETOH dependence presenting with cough and SOB.Patient has "just about coughed myself to death. Hurting, my chest, my head." He has also had blurry vision. Symptoms have been going on for a couple of weeks. Seen 10 days ago for COPD exacerbation - given prednisone and azithromycin.Medications helped a little but then the symptoms came back worse. He has never had this before. Mild fevers up to 102, last about a week ago. +SOB with ambulation. Mild ankle edema. Lives at the shelter, several folks with illness. When asked about TB, "that's what they keep telling me down there, to get checked for that. Cause I keep them awake at night coughing."  Reports no ETOH today.7 beers yesterday. +h/o ETOH withdrawal - tremors, GI upset. Denies seizures. +auditory hallucinations, no visual.  ED Course:ETOH+. Albuterol x 1, Duoneb x 2, sounds worse. Maintaining O2 sats, but increased WOB. Failed outpatient management once. Negative CXR. Doxycycline, Solumedrol given.  Assessment & Plan:   Principal Problem:   Acute respiratory failure with hypoxia (HCC) Active Problems:   Benign essential HTN   COPD exacerbation (HCC)   Alcohol use disorder, severe, dependence (HCC)   Malnutrition of moderate degree   Tobacco abuse   Homelessness  Acute hypoxic respiratory failure secondary to acute COPD exacerbation.   -Patient also homeless and history of alcohol abuse more common Klebsiella and staph aureus.   -Patient currently in negative pressure room.  QuantiFERON gold and AFB smears ordered. first one indeterminate. ID curbside and recommended PPD placement. Patient with 1/3 AFB neg -Continue current antibiotic (doxycycline and DuoNeb) -holding on  systemic steroids for now, given concerns for TB.  Alcohol abuse and dependence  -continue seizure precautions -continue CIWA protocol  Hypertension  -continue Lopressor. -BP stable  Homeless patient lives in shelter. -SW aware and will follow rec's for assistance with discharge when stable.  DVT prophylaxis: lovenox Code Statusfull Family Communication: none Disposition: continue current tx; follow TB r/o work up. ID curbside. Follow PT rec's for discharge.  Consultants:  ID curbside (recommended finish AFB and place PPD)  Procedures:none  Antimicrobials:doxycycline  Subjective: Afebrile, no CP, no nausea, no vomiting, no CP and with improvements in his breathing.  Objective: Vitals:   06/27/17 0738 06/27/17 1133 06/27/17 1714 06/27/17 2011  BP:  101/63 102/66   Pulse:  78 97   Resp:  16 18   Temp:  97.7 F (36.5 C) 98.3 F (36.8 C)   TempSrc:  Oral Oral   SpO2: 93% 94% 94% 95%  Weight:      Height:        Intake/Output Summary (Last 24 hours) at 06/27/2017 2320 Last data filed at 06/27/2017 1714 Gross per 24 hour  Intake 250 ml  Output 3075 ml  Net -2825 ml   Filed Weights   06/24/17 1626 06/25/17 0059  Weight: 61.2 kg (135 lb) 63 kg (138 lb 14.2 oz)    Examination:  General exam: afebrile, no CP and reporting improvement in SOB. No nausea, no vomiting.  Respiratory system: improved air movement, no crackles, mild exp wheezing, no using accessory muscles. Cardiovascular system: S1 and S2, no rubs, no gallops, no JVD Gastrointestinal system: soft, NT, ND, positive BS Central nervous system: oriented /x3, CN intact, no focal motor or neurologic deficit. Extremities: no edema, no  cyanosis, no clubbing Psychiatry: Judgement and insight appear normal. Mood & affect appropriate.   Data Reviewed: I have personally reviewed following labs and imaging studies  CBC: Recent Labs  Lab 06/24/17 1900 06/25/17 0439 06/26/17 0430  WBC 13.0* 11.1* 13.0*    NEUTROABS 9.6*  --   --   HGB 11.0* 9.8* 10.1*  HCT 34.5* 30.9* 31.9*  MCV 80.2 80.7 80.6  PLT 416* 393 176*   Basic Metabolic Panel: Recent Labs  Lab 06/24/17 1900 06/25/17 0439 06/26/17 0430  NA 137 136 139  K 3.3* 4.3 4.2  CL 103 104 106  CO2 24 21* 28  GLUCOSE 93 275* 93  BUN 6 12 10   CREATININE 0.75 0.97 0.78  CALCIUM 8.6* 8.6* 8.4*  MG  --  1.7  --    GFR: Estimated Creatinine Clearance: 80.2 mL/min (by C-G formula based on SCr of 0.78 mg/dL).   Liver Function Tests: Recent Labs  Lab 06/24/17 1900  AST 20  ALT 13*  ALKPHOS 76  BILITOT 0.6  PROT 7.0  ALBUMIN 3.1*   Recent Labs  Lab 06/24/17 1900  LIPASE 22   HbA1C: Recent Labs    06/26/17 0430  HGBA1C 5.4    Recent Results (from the past 240 hour(s))  MRSA PCR Screening     Status: None   Collection Time: 06/25/17  1:14 AM  Result Value Ref Range Status   MRSA by PCR NEGATIVE NEGATIVE Final    Comment:        The GeneXpert MRSA Assay (FDA approved for NASAL specimens only), is one component of a comprehensive MRSA colonization surveillance program. It is not intended to diagnose MRSA infection nor to guide or monitor treatment for MRSA infections.   Acid Fast Smear (AFB)     Status: None   Collection Time: 06/25/17  2:02 AM  Result Value Ref Range Status   AFB Specimen Processing Concentration  Final   Acid Fast Smear Negative  Final    Comment: (NOTE) Performed At: [facility] [place], [place] [id number] Rush Farmer MD YI:[id number]    Source (AFB) SPUTUM  Final     Radiology Studies: No results found.   Scheduled Meds: . aspirin EC  81 mg Oral Daily  . chlorhexidine  15 mL Mouth Rinse BID  . docusate sodium  100 mg Oral BID  . doxycycline  100 mg Oral Q12H  . enoxaparin (LOVENOX) injection  40 mg Subcutaneous Q24H  . feeding supplement (ENSURE ENLIVE)  237 mL Oral BID BM  . folic acid  1 mg Oral Daily  . ipratropium-albuterol  3 mL  Nebulization TID  . LORazepam  0-4 mg Intravenous Q12H  . mouth rinse  15 mL Mouth Rinse q12n4p  . metoprolol tartrate  50 mg Oral BID  . multivitamin with minerals  1 tablet Oral Daily  . nicotine  14 mg Transdermal Daily  . thiamine  100 mg Oral Daily   Or  . thiamine  100 mg Intravenous Daily  . traZODone  100 mg Oral QHS   Continuous Infusions: . albuterol 10 mg/hr (06/24/17 2139)     LOS: 3 days    Barton Dubois, MD [facility] [phone] If 7PM-7AM, please contact night-coverage [email or web address] Password [email or web address] 06/27/2017, 11:20 PM

## 2017-06-28 ENCOUNTER — Telehealth: Payer: Self-pay | Admitting: Internal Medicine

## 2017-06-28 LAB — BASIC METABOLIC PANEL
Anion gap: 5 (ref 5–15)
BUN: 14 mg/dL (ref 6–20)
CHLORIDE: 105 mmol/L (ref 101–111)
CO2: 27 mmol/L (ref 22–32)
CREATININE: 0.74 mg/dL (ref 0.61–1.24)
Calcium: 8.4 mg/dL — ABNORMAL LOW (ref 8.9–10.3)
GFR calc non Af Amer: >60 mL/min (ref >60)
Glucose, Bld: 99 mg/dL (ref 65–99)
POTASSIUM: 3.8 mmol/L (ref 3.5–5.1)
Sodium: 137 mmol/L (ref 135–145)

## 2017-06-28 LAB — CBC
HEMATOCRIT: 34.9 % — AB (ref 39.0–52.0)
HEMOGLOBIN: 10.9 g/dL — AB (ref 13.0–17.0)
MCH: 25.2 pg — AB (ref 26.0–34.0)
MCHC: 31.2 g/dL (ref 30.0–36.0)
MCV: 80.6 fL (ref 78.0–100.0)
Platelets: 466 10*3/uL — ABNORMAL HIGH (ref 150–400)
RBC: 4.33 MIL/uL (ref 4.22–5.81)
RDW: 16.3 % — ABNORMAL HIGH (ref 11.5–15.5)
WBC: 9.1 10*3/uL (ref 4.0–10.5)

## 2017-06-28 LAB — GLUCOSE, CAPILLARY: GLUCOSE-CAPILLARY: 97 mg/dL (ref 65–99)

## 2017-06-28 MED ORDER — IPRATROPIUM-ALBUTEROL 0.5-2.5 (3) MG/3ML IN SOLN
3.0000 mL | Freq: Two times a day (BID) | RESPIRATORY_TRACT | Status: DC
  Administered 2017-06-28 – 2017-06-29 (×3): 3 mL via RESPIRATORY_TRACT
  Filled 2017-06-28 (×2): qty 3

## 2017-06-28 NOTE — Telephone Encounter (Signed)
Call placed to Marney Doctor (case manager) [phone] and she informed me that patient's case manager is Cookie. Cookie was in the same room and it was confirmed that patient wouldn't be discharged today because of TB testing.  Informed them that patient had been part of the transitional care clinic in the past and therefore can be seen next week. Appointment was scheduled for Wednesday 12/5 at [facility] that I would be adding appointment time on patient's discharge paperwork. Jane ([facility]'s CM) actually added appointment time on AVS.

## 2017-06-28 NOTE — Progress Notes (Signed)
Occupational Therapy Treatment Patient Details Name: Samuel Reyes MRN: [id number] DOB: 09/02/1955 Today's Date: 06/28/2017    History of present illness 61 y.o. male with medical history significant of homelessness; remote CVA and MI; HTN; depression; COPD; and ETOH dependence presenting with cough and SOB; admitted for acute respiratory failure   OT comments  Pt ambulated @ room with S and complete LB ADL task with set up. Pt making steady progress. Will continue to follow.   Follow Up Recommendations  Supervision/Assistance - 24 hour;SNF    Equipment Recommendations  3 in 1 bedside commode    Recommendations for Other Services      Precautions / Restrictions Restrictions Weight Bearing Restrictions: No       Mobility Bed Mobility Overal bed mobility: Modified Independent                Transfers Overall transfer level: Needs assistance Equipment used: None   Sit to Stand: Supervision         General transfer comment: ambulated with S    Balance Overall balance assessment: Needs assistance   Sitting balance-Leahy Scale: Good     Standing balance support: During functional activity Standing balance-Leahy Scale: Good                             ADL either performed or assessed with clinical judgement   ADL Overall ADL's : Needs assistance/impaired     Grooming: Supervision/safety;Standing       Lower Body Bathing: Supervison/ safety;Sit to/from stand       Lower Body Dressing: Set up;Supervision/safety;Sit to/from stand   Toilet Transfer: Supervision/safety;Comfort height toilet           Functional mobility during ADLs: Supervision/safety General ADL Comments: bed wet form condom cath; assisted with changing linens; pt ambulated to charger and plugged up phone     Vision       Perception     Praxis      Cognition Arousal/Alertness: Awake/alert Behavior During Therapy: WFL for tasks assessed/performed Overall  Cognitive Status: Within Functional Limits for tasks assessed                                 General Comments: discussed his son and wife's deaths        Exercises     Shoulder Instructions       General Comments      Pertinent Vitals/ Pain       Pain Assessment: Faces Faces Pain Scale: Hurts little more Pain Location: headache Pain Descriptors / Indicators: Aching Pain Intervention(s): Limited activity within patient's tolerance;Patient requesting pain meds-RN notified  Home Living                                          Prior Functioning/Environment              Frequency  Min 2X/week        Progress Toward Goals  OT Goals(current goals can now be found in the care plan section)  Progress towards OT goals: Progressing toward goals  Acute Rehab OT Goals Patient Stated Goal: none stated OT Goal Formulation: With patient Time For Goal Achievement: 07/02/17 Potential to Achieve Goals: Good ADL Goals Pt Will Transfer to Toilet: with supervision;bedside commode;regular height  toilet;ambulating Pt Will Perform Toileting - Clothing Manipulation and hygiene: with supervision;sit to/from stand Additional ADL Goal #1: pt will complete LB adls with supervision, taking rest breaks as neede Additional ADL Goal #2: pt will complete level one theraband exercises with supervision and handout to increase strength for adls  Plan Discharge plan remains appropriate    Co-evaluation                 AM-PAC PT "6 Clicks" Daily Activity     Outcome Measure   Help from another person eating meals?: None Help from another person taking care of personal grooming?: None Help from another person toileting, which includes using toliet, bedpan, or urinal?: A Little Help from another person bathing (including washing, rinsing, drying)?: A Little Help from another person to put on and taking off regular upper body clothing?: None Help from  another person to put on and taking off regular lower body clothing?: A Little 6 Click Score: 21    End of Session    OT Visit Diagnosis: Muscle weakness (generalized) (M62.81);Unsteadiness on feet (R26.81)   Activity Tolerance Patient tolerated treatment well   Patient Left in bed;with call bell/phone within reach;with bed alarm set   Nurse Communication Mobility status;Patient requests pain meds        Time: 1515-1535 OT Time Calculation (min): 20 min  Charges: OT General Charges $OT Visit: 1 Visit OT Treatments $Self Care/Home Management : 8-22 mins  [facility], OT/L  [phone] 06/28/2017   Shell Yandow,HILLARY 06/28/2017, 3:49 PM

## 2017-06-28 NOTE — Progress Notes (Signed)
PROGRESS NOTE    Samuel Reyes  PJA:[id number] DOB: January 20, 1956 DOA: 06/24/2017 PCP: Maren Reamer, MD (Inactive)  Brief Narrative:  61 y.o.malewith medical history significant ofhomelessness; remote CVA and MI; HTN; depression; COPD; and ETOH dependence presenting with cough and SOB.Patient has "just about coughed myself to death. Hurting, my chest, my head." He has also had blurry vision. Symptoms have been going on for a couple of weeks. Seen 10 days ago for COPD exacerbation - given prednisone and azithromycin.Medications helped a little but then the symptoms came back worse. He has never had this before. Mild fevers up to 102, last about a week ago. +SOB with ambulation. Mild ankle edema. Lives at the shelter, several folks with illness. When asked about TB, "that's what they keep telling me down there, to get checked for that. Cause I keep them awake at night coughing."  Reports no ETOH today.7 beers yesterday. +h/o ETOH withdrawal - tremors, GI upset. Denies seizures. +auditory hallucinations, no visual.  ED Course:ETOH+. Albuterol x 1, Duoneb x 2, sounds worse. Maintaining O2 sats, but increased WOB. Failed outpatient management once. Negative CXR. Doxycycline, Solumedrol given.  Assessment & Plan:   Principal Problem:   Acute respiratory failure with hypoxia (HCC) Active Problems:   Benign essential HTN   COPD exacerbation (HCC)   Alcohol use disorder, severe, dependence (HCC)   Malnutrition of moderate degree   Tobacco abuse   Homelessness  Acute hypoxic respiratory failure secondary to acute COPD exacerbation.   -Patient also homeless and history of alcohol abuse more common Klebsiella and staph aureus.   -Patient currently in negative pressure room.  QuantiFERON gold and AFB smears ordered. first one indeterminate. ID curbside and recommended PPD placement and to finalize AFB testing. -Patient with 1/3 AFB neg; will follow tuberculin test  results. -RT has been consulted for this.  Induction to finalize AFB testing. -Continue current antibiotic (doxycycline and DuoNeb) -holding on systemic steroids for now, given concerns for TB. -Patient with improvement movement and currently no significant wheezing or shortness of breath described.  Alcohol abuse and dependence  -No seizure appreciated -CIWA score 4 -continue monitoring on CIWA protocol; but not signs of withdrawal seen so far -continue thiamine and folic acid.  Tobacco abuse -Patient appreciative for nicotine patch -Smoking cessation counseling provided.  Hypertension  -Blood pressure stable and well-controlled -Continue Lopressor  Homeless patient lives in shelter. -SW aware and will follow rec's for assistance with discharge when stable.  Physical deconditioning -Appreciate PT/OT assessment; will follow recommendations.  Insomnia -Continue trazodone  DVT prophylaxis: lovenox Code Statusfull Family Communication: none Disposition: continue current tx; follow TB r/o work up. ID curbside. Follow PT rec's for discharge.  RT has been consulted for this.  Induction to finalize AFB testing.  Consultants:  ID curbside (recommended finish AFB and place PPD)  Procedures:none  Antimicrobials:doxycycline  Subjective: Afebrile, no chest pain, no nausea, no vomiting.  Patient reports improvement in his breathing and main concern is just generalized weakness with decreased appetite.  Objective: Vitals:   06/28/17 0000 06/28/17 0627 06/28/17 0735 06/28/17 1211  BP: 100/65 119/76  111/73  Pulse: 87 67  91  Resp: 18 18  18   Temp: 98.4 F (36.9 C) 98 F (36.7 C)  98.3 F (36.8 C)  TempSrc: Oral Oral  Oral  SpO2: 98% 98% 96% 96%  Weight:      Height:        Intake/Output Summary (Last 24 hours) at 06/28/2017 1811 Last data filed at  06/28/2017 1500 Gross per 24 hour  Intake 250 ml  Output 550 ml  Net -300 ml   Filed Weights   06/24/17 1626  06/25/17 0059  Weight: 61.2 kg (135 lb) 63 kg (138 lb 14.2 oz)    Examination:  General exam: No fever, no chest pain, no nausea or vomiting.  Patient reports significant improvement in his breathing and only complaints of feeling weak.  Respiratory system: No crackles, no using accessory muscles, no wheezing appreciated during this examination.  Improved air movement in general.  Cardiovascular system: S1 and S2, no rubs, no gallops, no JVD. Gastrointestinal system: Soft, nontender, nondistended, positive bowel sounds, no guarding. Central nervous system: Alert, awake and oriented x3, cranial nerves grossly intact, no focal motor deficit or neurologic deficit appreciated.  Patient able to follow commands appropriately.   Extremities: No edema, no cyanosis, no clubbing. Psychiatry: Stable mood, judgment and insight appears preserved.  Data Reviewed: I have personally reviewed following labs and imaging studies  CBC: Recent Labs  Lab 06/24/17 1900 06/25/17 0439 06/26/17 0430 06/28/17 0502  WBC 13.0* 11.1* 13.0* 9.1  NEUTROABS 9.6*  --   --   --   HGB 11.0* 9.8* 10.1* 10.9*  HCT 34.5* 30.9* 31.9* 34.9*  MCV 80.2 80.7 80.6 80.6  PLT 416* 393 427* 370*   Basic Metabolic Panel: Recent Labs  Lab 06/24/17 1900 06/25/17 0439 06/26/17 0430 06/28/17 0502  NA 137 136 139 137  K 3.3* 4.3 4.2 3.8  CL 103 104 106 105  CO2 24 21* 28 27  GLUCOSE 93 275* 93 99  BUN 6 12 10 14   CREATININE 0.75 0.97 0.78 0.74  CALCIUM 8.6* 8.6* 8.4* 8.4*  MG  --  1.7  --   --    GFR: Estimated Creatinine Clearance: 80.2 mL/min (by C-G formula based on SCr of 0.74 mg/dL).   Liver Function Tests: Recent Labs  Lab 06/24/17 1900  AST 20  ALT 13*  ALKPHOS 76  BILITOT 0.6  PROT 7.0  ALBUMIN 3.1*   Recent Labs  Lab 06/24/17 1900  LIPASE 22   HbA1C: Recent Labs    06/26/17 0430  HGBA1C 5.4    Recent Results (from the past 240 hour(s))  MRSA PCR Screening     Status: None   Collection  Time: 06/25/17  1:14 AM  Result Value Ref Range Status   MRSA by PCR NEGATIVE NEGATIVE Final    Comment:        The GeneXpert MRSA Assay (FDA approved for NASAL specimens only), is one component of a comprehensive MRSA colonization surveillance program. It is not intended to diagnose MRSA infection nor to guide or monitor treatment for MRSA infections.   Acid Fast Smear (AFB)     Status: None   Collection Time: 06/25/17  2:02 AM  Result Value Ref Range Status   AFB Specimen Processing Concentration  Final   Acid Fast Smear Negative  Final    Comment: (NOTE) Performed At: [facility] [place], [place] [id number] Rush Farmer MD [id number]    Source (AFB) SPUTUM  Final     Radiology Studies: No results found.   Scheduled Meds: . aspirin EC  81 mg Oral Daily  . chlorhexidine  15 mL Mouth Rinse BID  . docusate sodium  100 mg Oral BID  . doxycycline  100 mg Oral Q12H  . enoxaparin (LOVENOX) injection  40 mg Subcutaneous Q24H  . feeding supplement (ENSURE ENLIVE)  237 mL  Oral BID BM  . folic acid  1 mg Oral Daily  . ipratropium-albuterol  3 mL Nebulization BID  . LORazepam  0-4 mg Intravenous Q12H  . mouth rinse  15 mL Mouth Rinse q12n4p  . metoprolol tartrate  50 mg Oral BID  . multivitamin with minerals  1 tablet Oral Daily  . nicotine  14 mg Transdermal Daily  . thiamine  100 mg Oral Daily  . traZODone  100 mg Oral QHS  . tuberculin  5 Units Intradermal Once   Continuous Infusions: . albuterol 10 mg/hr (06/24/17 2139)     LOS: 4 days   Time: 25 minutes.  [facility] Dubois, MD [facility] [phone] If 7PM-7AM, please contact night-coverage [email or web address] Password [email or web address] 06/28/2017, 6:11 PM

## 2017-06-29 ENCOUNTER — Inpatient Hospital Stay (HOSPITAL_COMMUNITY): Payer: Medicaid Other

## 2017-06-29 DIAGNOSIS — J9601 Acute respiratory failure with hypoxia: Secondary | ICD-10-CM

## 2017-06-29 MED ORDER — IPRATROPIUM-ALBUTEROL 0.5-2.5 (3) MG/3ML IN SOLN: 3.0000 mL | Freq: Four times a day (QID) | RESPIRATORY_TRACT | Status: DC | PRN

## 2017-06-29 NOTE — Progress Notes (Signed)
PROGRESS NOTE    Samuel Reyes  ZOX:[id number] DOB: 04/29/1956 DOA: 06/24/2017 PCP: Maren Reamer, MD (Inactive)  Brief Narrative:  61 y.o.malewith medical history significant ofhomelessness; remote CVA and MI; HTN; depression; COPD; and ETOH dependence presenting with cough and SOB.Patient has "just about coughed myself to death. Hurting, my chest, my head." He has also had blurry vision. Symptoms have been going on for a couple of weeks. Seen 10 days ago for COPD exacerbation - given prednisone and azithromycin.Medications helped a little but then the symptoms came back worse. He has never had this before. Mild fevers up to 102, last about a week ago. +SOB with ambulation. Mild ankle edema. Lives at the shelter, several folks with illness. When asked about TB, "that's what they keep telling me down there, to get checked for that. Cause I keep them awake at night coughing."  Reports no ETOH today.7 beers yesterday. +h/o ETOH withdrawal - tremors, GI upset. Denies seizures. +auditory hallucinations, no visual.  ED Course:ETOH+. Albuterol x 1, Duoneb x 2, sounds worse. Maintaining O2 sats, but increased WOB. Failed outpatient management once. Negative CXR. Doxycycline, Solumedrol given.  Assessment & Plan:   Principal Problem:   Acute respiratory failure with hypoxia (HCC) Active Problems:   Benign essential HTN   COPD exacerbation (HCC)   Alcohol use disorder, severe, dependence (HCC)   Malnutrition of moderate degree   Tobacco abuse   Homelessness  Acute hypoxic respiratory failure secondary to acute COPD exacerbation.   -Patient also homeless and history of alcohol abuse more common Klebsiella and staph aureus.   -Patient currently in negative pressure room.  QuantiFERON gold and AFB smears ordered. first one indeterminate.  -Patient with 1/3 AFB neg; will follow tuberculin test results. -PPD negative -Continue as needed DuoNeb -Discontinue  isolation -Patient continued to have improvement in his breathing.   -Discussed with ID will not pursued any further workup with screening for TB.   -Repeat CT scan of his chest given prior history of abnormal CT, heavy smoking, hemoptysis.  Alcohol abuse and dependence  -No seizure appreciated -continue monitoring on CIWA protocol; not signs of withdrawal seen so far -continue thiamine and folic acid. -Alcohol cessation counseling provided  Tobacco abuse -Patient appreciative for nicotine patch -Smoking cessation counseling provided.  Hypertension  -Blood pressure stable and well-controlled -Will Continue Lopressor  Homeless patient lives in shelter. -SW aware and will follow rec's for assistance with discharge when stable.  Physical deconditioning -Appreciate PT/OT assessment; will follow recommendations. -Social work consulted for SNF placement  Insomnia -Continue trazodone  DVT prophylaxis: lovenox Code Statusfull Family Communication: none Disposition: As discussed with infectious disease no further pursued to improve TB.  Discontinue isolation, finish antibiotics as previously scheduled.  We will repeat CT scan of his chest given history of hemoptysis and a prior CT 49-month ago demonstrating some nodularities.  Social worker has been consulted for skilled nursing facility.  Consultants:  ID   Procedures:none  Antimicrobials:doxycycline  Subjective: No fever, no chest pain, no nausea, no vomiting.  Breathing is steadily improving and complains only of feeling weak.  Objective: Vitals:   06/29/17 0655 06/29/17 1049 06/29/17 1417 06/29/17 1438  BP: (!) 143/65  111/74   Pulse: 87  90   Resp: 18  18   Temp: 98 F (36.7 C)  (!) 97.5 F (36.4 C) 98.5 F (36.9 C)  TempSrc: Oral  Axillary Oral  SpO2: 98% 98% 98%   Weight:      Height:  Intake/Output Summary (Last 24 hours) at 06/29/2017 1701 Last data filed at 06/29/2017 1400 Gross per 24 hour    Intake 360 ml  Output 550 ml  Net -190 ml   Filed Weights   06/24/17 1626 06/25/17 0059  Weight: 61.2 kg (135 lb) 63 kg (138 lb 14.2 oz)    Examination: General exam: Patient afebrile, no chest pain, no nausea vomiting.  Reports significant improvement in his breathing and is just complaining of being weak.    Respiratory system: No wheezing, no using accessory muscles, good air movement, scattered rhonchi.  Normal respiratory effort.  Patient with good oxygen saturation on room air.   Cardiovascular system: S1-S2, no rubs, no gallops, no JVD.  Gastrointestinal system: Soft, nontender, nondistended, positive bowel sounds Central nervous system: Alert, awake and oriented x3, cranial nerves grossly intact, no focal motor deficit or neurologic deficit appreciated. Patient following commands properly. Extremities: No edema, no cyanosis, no clubbing. Psychiatry: Patient remain with stable mood, normal judgment and insight.  No suicidal ideation or hallucinations.    Data Reviewed: I have personally reviewed following labs and imaging studies  CBC: Recent Labs  Lab 06/24/17 1900 06/25/17 0439 06/26/17 0430 06/28/17 0502  WBC 13.0* 11.1* 13.0* 9.1  NEUTROABS 9.6*  --   --   --   HGB 11.0* 9.8* 10.1* 10.9*  HCT 34.5* 30.9* 31.9* 34.9*  MCV 80.2 80.7 80.6 80.6  PLT 416* 393 427* 353*   Basic Metabolic Panel: Recent Labs  Lab 06/24/17 1900 06/25/17 0439 06/26/17 0430 06/28/17 0502  NA 137 136 139 137  K 3.3* 4.3 4.2 3.8  CL 103 104 106 105  CO2 24 21* 28 27  GLUCOSE 93 275* 93 99  BUN 6 12 10 14   CREATININE 0.75 0.97 0.78 0.74  CALCIUM 8.6* 8.6* 8.4* 8.4*  MG  --  1.7  --   --    GFR: Estimated Creatinine Clearance: 80.2 mL/min (by C-G formula based on SCr of 0.74 mg/dL).   Liver Function Tests: Recent Labs  Lab 06/24/17 1900  AST 20  ALT 13*  ALKPHOS 76  BILITOT 0.6  PROT 7.0  ALBUMIN 3.1*   Recent Labs  Lab 06/24/17 1900  LIPASE 22   HbA1C: No results  for input(s): HGBA1C in the last 72 hours.  Recent Results (from the past 240 hour(s))  MRSA PCR Screening     Status: None   Collection Time: 06/25/17  1:14 AM  Result Value Ref Range Status   MRSA by PCR NEGATIVE NEGATIVE Final    Comment:        The GeneXpert MRSA Assay (FDA approved for NASAL specimens only), is one component of a comprehensive MRSA colonization surveillance program. It is not intended to diagnose MRSA infection nor to guide or monitor treatment for MRSA infections.   Acid Fast Smear (AFB)     Status: None   Collection Time: 06/25/17  2:02 AM  Result Value Ref Range Status   AFB Specimen Processing Concentration  Final   Acid Fast Smear Negative  Final    Comment: (NOTE) Performed At: [facility] [place], [place] [id number] Rush Farmer MD MH:[id number]    Source (AFB) SPUTUM  Final     Radiology Studies: No results found.   Scheduled Meds: . aspirin EC  81 mg Oral Daily  . chlorhexidine  15 mL Mouth Rinse BID  . docusate sodium  100 mg Oral BID  . doxycycline  100 mg Oral  Q12H  . enoxaparin (LOVENOX) injection  40 mg Subcutaneous Q24H  . feeding supplement (ENSURE ENLIVE)  237 mL Oral BID BM  . folic acid  1 mg Oral Daily  . ipratropium-albuterol  3 mL Nebulization BID  . mouth rinse  15 mL Mouth Rinse q12n4p  . metoprolol tartrate  50 mg Oral BID  . multivitamin with minerals  1 tablet Oral Daily  . nicotine  14 mg Transdermal Daily  . thiamine  100 mg Oral Daily  . traZODone  100 mg Oral QHS  . tuberculin  5 Units Intradermal Once   Continuous Infusions: . albuterol 10 mg/hr (06/24/17 2139)     LOS: 5 days   Time: 25 minutes.  [facility] Dubois, MD [facility] [phone] If 7PM-7AM, please contact night-coverage [email or web address] Password [email or web address] 06/29/2017, 5:01 PM

## 2017-06-29 NOTE — Consult Note (Signed)
[facility]       Reason for Consult: ? Tb    Referring Physician: Dr. Dyann Kief  Principal Problem:   Acute respiratory failure with hypoxia [facility]) Active Problems:   Benign essential HTN   COPD exacerbation (HCC)   Alcohol use disorder, severe, dependence ([place])   Malnutrition of moderate degree   Tobacco abuse   Homelessness   . aspirin EC  81 mg Oral Daily  . chlorhexidine  15 mL Mouth Rinse BID  . docusate sodium  100 mg Oral BID  . doxycycline  100 mg Oral Q12H  . enoxaparin (LOVENOX) injection  40 mg Subcutaneous Q24H  . feeding supplement (ENSURE ENLIVE)  237 mL Oral BID BM  . folic acid  1 mg Oral Daily  . ipratropium-albuterol  3 mL Nebulization BID  . mouth rinse  15 mL Mouth Rinse q12n4p  . metoprolol tartrate  50 mg Oral BID  . multivitamin with minerals  1 tablet Oral Daily  . nicotine  14 mg Transdermal Daily  . thiamine  100 mg Oral Daily  . traZODone  100 mg Oral QHS  . tuberculin  5 Units Intradermal Once    Recommendations: Stop doxycycline Cancel AFBs D/c isolation  Consider follow up of CT scan nodule findings for concern for cancer with smoking histroy  Assessment: He is homeless and has a history of small nodules on CT scan done in January 2018 but CXR without significant findings now, ppd is negative (almost 48 hours).  Some hemoptysis by his report but no productive cough since admission.  Never in jail, HIV negative.  Not c/w active Tb.    Antibiotics: Doxycycline day 6  HPI: Samuel Reyes is a 61 y.o. male homeless, living in a shelter, smoker, history of CVA and MI, COPD and alcohol dependence who came in with worsening cough.  CXR done and no acute disease, some bronchitic changes, fever to 102 prior to admission.  Afebrile since admission, initially hypoxic, now on RA, coughing improved.  Quantiferon gold indeterminate, ppd preliminary negative.   CXR independently reviewed and no acute infiltrate.    Review  of Systems:  Constitutional: negative for fatigue and malaise Gastrointestinal: negative for diarrhea Integument/breast: negative for rash All other systems reviewed and are negative    Past Medical History:  Diagnosis Date  . Alcoholism ([place])   . CAD (coronary artery disease) 2002   MI, no intervention required  . COPD (chronic obstructive pulmonary disease) (HCC)    not on home O2  . Depression   . GERD (gastroesophageal reflux disease)   . Headache   . Hypertension   . Stroke [facility]) 2006    Social History   Tobacco Use  . Smoking status: Current Every Day Smoker    Packs/day: 2.00    Years: 52.00    Pack years: 104.00    Types: Cigarettes    Start date: 07/30/1966  . Smokeless tobacco: Never Used  . Tobacco comment: down to 1/4 ppd  Substance Use Topics  . Alcohol use: Yes    Alcohol/week: 1.8 - 2.4 oz    Types: 3 - 4 Cans of beer per week    Comment: everyday- 3-4 40 oz  . Drug use: No    Comment: denied using any drugs    Family History  Problem Relation Age of Onset  . Cancer Father        bone  . Cancer Brother  lungs  . Stroke Maternal Grandmother   . Asthma Son        died at age 97 in his sleep   . Spina bifida Son        died at age 69   . Dementia Mother   . Colon cancer Neg Hx     Allergies  Allergen Reactions  . Bee Venom Anaphylaxis  . Penicillins Rash    Has patient had a PCN reaction causing immediate rash, facial/tongue/throat swelling, SOB or lightheadedness with hypotension: {Yes Has patient had a PCN reaction causing severe rash involving mucus membranes or skin necrosis: YES Has patient had a PCN reaction that required hospitalization Yes Has patient had a PCN reaction occurring within the last 10 years: YES If all of the above answers are "NO", then may proceed with Cephalosporin use.     Physical Exam: Constitutional: in no apparent distress and alert  Vitals:   06/29/17 0655 06/29/17 1049  BP: (!) 143/65   Pulse: 87     Resp: 18   Temp: 98 F (36.7 C)   SpO2: 98% 98%   EYES: anicteric ENMT: no thrush Cardiovascular: Cor RRR Respiratory: CTA B; normal respiratory effort GI: Bowel sounds are normal, liver is not enlarged, spleen is not enlarged Musculoskeletal: no pedal edema noted Skin: negatives: no rash Hematologic: no cervical lad  Lab Results  Component Value Date   WBC 9.1 06/28/2017   HGB 10.9 (L) 06/28/2017   HCT 34.9 (L) 06/28/2017   MCV 80.6 06/28/2017   PLT 466 (H) 06/28/2017    Lab Results  Component Value Date   CREATININE 0.74 06/28/2017   BUN 14 06/28/2017   NA 137 06/28/2017   K 3.8 06/28/2017   CL 105 06/28/2017   CO2 27 06/28/2017    Lab Results  Component Value Date   ALT 13 (L) 06/24/2017   AST 20 06/24/2017   ALKPHOS 76 06/24/2017     Microbiology: Recent Results (from the past 240 hour(s))  MRSA PCR Screening     Status: None   Collection Time: 06/25/17  1:14 AM  Result Value Ref Range Status   MRSA by PCR NEGATIVE NEGATIVE Final    Comment:        The GeneXpert MRSA Assay (FDA approved for NASAL specimens only), is one component of a comprehensive MRSA colonization surveillance program. It is not intended to diagnose MRSA infection nor to guide or monitor treatment for MRSA infections.   Acid Fast Smear (AFB)     Status: None   Collection Time: 06/25/17  2:02 AM  Result Value Ref Range Status   AFB Specimen Processing Concentration  Final   Acid Fast Smear Negative  Final    Comment: (NOTE) Performed At: [facility] [address] [place], [place] [id number] Rush Farmer MD [id number]    Source (AFB) SPUTUM  Final    Scharlene Gloss, [place] for Infectious Disease [facility] [email or web address] [phone] pager  [phone] cell 06/29/2017, 12:02 PM

## 2017-06-30 MED ORDER — DIPHENHYDRAMINE HCL 25 MG PO CAPS
25.0000 mg | ORAL_CAPSULE | Freq: Three times a day (TID) | ORAL | Status: DC | PRN
  Administered 2017-06-30 – 2017-07-01 (×2): 25 mg via ORAL
  Filled 2017-06-30 (×2): qty 1

## 2017-06-30 NOTE — Progress Notes (Signed)
Pt received to room 1532 via wheelchair accompanied by family friends. Pt oriented to room and staff at this time. Acknowledged understanding. Pt's shoes placed in patient belonging bag. Pt acknowledged understanding of instructions given. Will continue to monitor

## 2017-06-30 NOTE — Social Work (Signed)
CSW reviewed chart. The plan is for patient to return to [facility] once medically stable. Pt is homeless and has a bed at [facility] per notes in chart. Therapy is not recommending SNF at this time. Pt appears to be connected to community resources at this time.  CSW will continue to follow if social worker intervention is needed.  Elissa Hefty, [place] Clinical Social Worker-Weekend [place] [phone]

## 2017-06-30 NOTE — Progress Notes (Signed)
PROGRESS NOTE    Letcher Schweikert  ZJQ:[id number] DOB: 1955-08-16 DOA: 06/24/2017 PCP: Maren Reamer, MD (Inactive)  Brief Narrative:  61 y.o.malewith medical history significant ofhomelessness; remote CVA and MI; HTN; depression; COPD; and ETOH dependence presenting with cough and SOB.Patient has "just about coughed myself to death. Hurting, my chest, my head." He has also had blurry vision. Symptoms have been going on for a couple of weeks. Seen 10 days ago for COPD exacerbation - given prednisone and azithromycin.Medications helped a little but then the symptoms came back worse. He has never had this before. Mild fevers up to 102, last about a week ago. +SOB with ambulation. Mild ankle edema. Lives at the shelter, several folks with illness. When asked about TB, "that's what they keep telling me down there, to get checked for that. Cause I keep them awake at night coughing."  Reports no ETOH today.7 beers yesterday. +h/o ETOH withdrawal - tremors, GI upset. Denies seizures. +auditory hallucinations, no visual.  ED Course:ETOH+. Albuterol x 1, Duoneb x 2, sounds worse. Maintaining O2 sats, but increased WOB. Failed outpatient management once. Negative CXR. Doxycycline, Solumedrol given.  Assessment & Plan:   Principal Problem:   Acute respiratory failure with hypoxia (HCC) Active Problems:   Benign essential HTN   COPD exacerbation (HCC)   Alcohol use disorder, severe, dependence (HCC)   Malnutrition of moderate degree   Tobacco abuse   Homelessness  Acute hypoxic respiratory failure secondary to acute COPD exacerbation.   -Patient also homeless and history of alcohol abuse more common Klebsiella and staph aureus.   -Patient currently in negative pressure room.  QuantiFERON gold and AFB smears ordered. first one indeterminate.  -Patient with 1/3 AFB neg; negative tuberculin test (PPD). -Continue as needed DuoNeb -Discontinue isolation as  discussed/recommended by ID -Patient continued to have improvement in his breathing.   -Repeat CT scan of his chest given prior history of abnormal CT, heavy smoking and hemoptysis order results pending at this moment.  If stable and no abnormalities appreciated will discharge home.. -Patient has completed antibiotic therapy while inpatient -We will discharge with prescription for COPD maintenance treatment and rescue inhalers.  Alcohol abuse and dependence  -No active withdrawal or seizures appreciated. -Continue monitor on CIWA protocol while inpatient -Continue thiamine and folic acid -Alcohol cessation has been provided.    Tobacco abuse -Patient appreciative for nicotine patch -Interested to quit smoking. -Smoking cessation counseling has been provided and information about heart #1-800 quit NOW also discussed.  Hypertension  -Blood pressure stable and well controlled.   -Will continue Lopressor.  Homeless patient lives in shelter. -Patient is planning to go back to [facility] at discharge -Case manager to assist as needed with any other potential help. -[facility].  Physical deconditioning -Appreciate PT/OT assessment; will follow recommendations. -Patient has decided to pursue return back to [facility] discuss with case manager for any other potential assistance at the moment of discharge.  Insomnia -Continue trazodone -Stable renal  DVT prophylaxis: lovenox Code Statusfull Family Communication: none Disposition: As discussed with infectious disease no further pursued to improve TB.  Discontinue isolation, finish antibiotics as previously scheduled.  We will repeat CT scan of his chest given history of hemoptysis and a prior CT 43-month ago demonstrating some nodularities.  After social worker assess patient situation plan is for him to return to [facility] (with outpatient follow-up  at the community [facility] for  ind[facility]).  Anticipate discharge tomorrow.  Consultants:  ID   Procedures:none  Antimicrobials:doxycycline  Subjective: No fever, no chest pain, no nausea, no vomiting.  Breathing has remained stable to slightly improved and the patient does complain of being weak.  Reports an improvement in his appetite.  Objective: Vitals:   06/30/17 0400 06/30/17 0546 06/30/17 0957 06/30/17 1400  BP:  112/70 127/77 110/72  Pulse: 82 72 79 (!) 51  Resp:  18  17  Temp:  98 F (36.7 C)  98.6 F (37 C)  TempSrc:  Axillary  Oral  SpO2:  97%  93%  Weight:      Height:        Intake/Output Summary (Last 24 hours) at 06/30/2017 1822 Last data filed at 06/30/2017 1800 Gross per 24 hour  Intake 720 ml  Output 2300 ml  Net -1580 ml   Filed Weights   06/24/17 1626 06/25/17 0059  Weight: 61.2 kg (135 lb) 63 kg (138 lb 14.2 oz)    Examination: General exam: No fever, no chest pain, no nausea, no vomiting.  Reports breathing to be stable.    Rest of patient's physical examination has remained stable since my exam on 06/29/17; please see below for details:  Respiratory system: No wheezing, no using accessory muscles, good air movement, scattered rhonchi.  Normal respiratory effort.  Patient with good oxygen saturation on room air.   Cardiovascular system: S1-S2, no rubs, no gallops, no JVD.  Gastrointestinal system: Soft, nontender, nondistended, positive bowel sounds Central nervous system: Alert, awake and oriented x3, cranial nerves grossly intact, no focal motor deficit or neurologic deficit appreciated. Patient following commands properly. Extremities: No edema, no cyanosis, no clubbing. Psychiatry: Patient remain with stable mood, normal judgment and insight.  No suicidal ideation or hallucinations.    Data Reviewed: I have personally reviewed following labs and imaging studies  CBC: Recent Labs  Lab 06/24/17 1900 06/25/17 0439 06/26/17 0430 06/28/17 0502    WBC 13.0* 11.1* 13.0* 9.1  NEUTROABS 9.6*  --   --   --   HGB 11.0* 9.8* 10.1* 10.9*  HCT 34.5* 30.9* 31.9* 34.9*  MCV 80.2 80.7 80.6 80.6  PLT 416* 393 427* 825*   Basic Metabolic Panel: Recent Labs  Lab 06/24/17 1900 06/25/17 0439 06/26/17 0430 06/28/17 0502  NA 137 136 139 137  K 3.3* 4.3 4.2 3.8  CL 103 104 106 105  CO2 24 21* 28 27  GLUCOSE 93 275* 93 99  BUN 6 12 10 14   CREATININE 0.75 0.97 0.78 0.74  CALCIUM 8.6* 8.6* 8.4* 8.4*  MG  --  1.7  --   --    GFR: Estimated Creatinine Clearance: 80.2 mL/min (by C-G formula based on SCr of 0.74 mg/dL).   Liver Function Tests: Recent Labs  Lab 06/24/17 1900  AST 20  ALT 13*  ALKPHOS 76  BILITOT 0.6  PROT 7.0  ALBUMIN 3.1*   Recent Labs  Lab 06/24/17 1900  LIPASE 22   HbA1C: No results for input(s): HGBA1C in the last 72 hours.  Recent Results (from the past 240 hour(s))  MRSA PCR Screening     Status: None   Collection Time: 06/25/17  1:14 AM  Result Value Ref Range Status   MRSA by PCR NEGATIVE NEGATIVE Final    Comment:        The GeneXpert MRSA Assay (FDA approved for NASAL specimens only), is one component of a comprehensive MRSA colonization surveillance program. It is not  intended to diagnose MRSA infection nor to guide or monitor treatment for MRSA infections.   Acid Fast Smear (AFB)     Status: None   Collection Time: 06/25/17  2:02 AM  Result Value Ref Range Status   AFB Specimen Processing Concentration  Final   Acid Fast Smear Negative  Final    Comment: (NOTE) Performed At: [facility] [place], [place] [id number] Rush Farmer MD HU:[id number]    Source (AFB) SPUTUM  Final     Radiology Studies: No results found.   Scheduled Meds: . aspirin EC  81 mg Oral Daily  . chlorhexidine  15 mL Mouth Rinse BID  . docusate sodium  100 mg Oral BID  . enoxaparin (LOVENOX) injection  40 mg Subcutaneous Q24H  . feeding supplement (ENSURE ENLIVE)  237 mL Oral BID  BM  . folic acid  1 mg Oral Daily  . mouth rinse  15 mL Mouth Rinse q12n4p  . metoprolol tartrate  50 mg Oral BID  . multivitamin with minerals  1 tablet Oral Daily  . nicotine  14 mg Transdermal Daily  . thiamine  100 mg Oral Daily  . traZODone  100 mg Oral QHS   Continuous Infusions: . albuterol 10 mg/hr (06/24/17 2139)     LOS: 6 days   Time: 25 minutes.  Barton Dubois, MD [facility] [phone] If 7PM-7AM, please contact night-coverage [email or web address] Password [facility] 06/30/2017, 6:22 PM

## 2017-07-01 DIAGNOSIS — Z720 Tobacco use: Secondary | ICD-10-CM

## 2017-07-01 DIAGNOSIS — E876 Hypokalemia: Secondary | ICD-10-CM

## 2017-07-01 DIAGNOSIS — G4709 Other insomnia: Secondary | ICD-10-CM

## 2017-07-01 DIAGNOSIS — Z7289 Other problems related to lifestyle: Secondary | ICD-10-CM

## 2017-07-01 DIAGNOSIS — E440 Moderate protein-calorie malnutrition: Secondary | ICD-10-CM

## 2017-07-01 DIAGNOSIS — Z789 Other specified health status: Secondary | ICD-10-CM

## 2017-07-01 DIAGNOSIS — R911 Solitary pulmonary nodule: Secondary | ICD-10-CM

## 2017-07-01 LAB — CBC
HEMATOCRIT: 36.1 % — AB (ref 39.0–52.0)
HEMOGLOBIN: 11.4 g/dL — AB (ref 13.0–17.0)
MCH: 25.2 pg — ABNORMAL LOW (ref 26.0–34.0)
MCHC: 31.6 g/dL (ref 30.0–36.0)
MCV: 79.7 fL (ref 78.0–100.0)
Platelets: 440 10*3/uL — ABNORMAL HIGH (ref 150–400)
RBC: 4.53 MIL/uL (ref 4.22–5.81)
RDW: 16.3 % — ABNORMAL HIGH (ref 11.5–15.5)
WBC: 7.4 10*3/uL (ref 4.0–10.5)

## 2017-07-01 LAB — BASIC METABOLIC PANEL
ANION GAP: 6 (ref 5–15)
BUN: 16 mg/dL (ref 6–20)
CHLORIDE: 106 mmol/L (ref 101–111)
CO2: 27 mmol/L (ref 22–32)
CREATININE: 0.82 mg/dL (ref 0.61–1.24)
Calcium: 8.9 mg/dL (ref 8.9–10.3)
GFR calc non Af Amer: >60 mL/min (ref >60)
Glucose, Bld: 96 mg/dL (ref 65–99)
POTASSIUM: 4.2 mmol/L (ref 3.5–5.1)
SODIUM: 139 mmol/L (ref 135–145)

## 2017-07-01 MED ORDER — PREDNISONE 10 MG PO TABS: ORAL_TABLET | ORAL | 0 refills | Status: DC

## 2017-07-01 MED ORDER — TRAZODONE HCL 100 MG PO TABS: 100.0000 mg | ORAL_TABLET | Freq: Every day | ORAL | Status: DC

## 2017-07-01 MED ORDER — DOCUSATE SODIUM 100 MG PO CAPS: 100.0000 mg | ORAL_CAPSULE | Freq: Two times a day (BID) | ORAL | 0 refills | Status: DC

## 2017-07-01 MED ORDER — ADULT MULTIVITAMIN W/MINERALS CH: 1.0000 | ORAL_TABLET | Freq: Every day | ORAL | 1 refills | Status: DC

## 2017-07-01 MED ORDER — NICOTINE 14 MG/24HR TD PT24: 14.0000 mg | MEDICATED_PATCH | Freq: Every day | TRANSDERMAL | 0 refills | Status: DC

## 2017-07-01 MED ORDER — TIOTROPIUM BROMIDE MONOHYDRATE 18 MCG IN CAPS: 18.0000 ug | ORAL_CAPSULE | Freq: Every day | RESPIRATORY_TRACT | 2 refills | Status: DC

## 2017-07-01 MED ORDER — ENSURE ENLIVE PO LIQD: 237.0000 mL | Freq: Two times a day (BID) | ORAL | 12 refills | Status: DC

## 2017-07-01 NOTE — Plan of Care (Signed)
  Progressing Health Behavior/Discharge Planning: Ability to manage health-related needs will improve 07/01/2017 0915 - Progressing by Milderd Meager, RN Clinical Measurements: Ability to maintain clinical measurements within normal limits will improve 07/01/2017 0915 - Progressing by Milderd Meager, RN Will remain free from infection 07/01/2017 0915 - Progressing by Milderd Meager, RN Diagnostic test results will improve 07/01/2017 0915 - Progressing by Milderd Meager, RN Respiratory complications will improve 07/01/2017 0915 - Progressing by Milderd Meager, RN Cardiovascular complication will be avoided 07/01/2017 0915 - Progressing by Milderd Meager, RN Skin Integrity: Risk for impaired skin integrity will decrease 07/01/2017 0915 - Progressing by Milderd Meager, RN Activity: Ability to implement measures to reduce episodes of fatigue will improve 07/01/2017 0915 - Progressing by Milderd Meager, RN Ability to tolerate increased activity will improve 07/01/2017 0915 - Progressing by Milderd Meager, RN Will verbalize the importance of balancing activity with adequate rest periods 07/01/2017 0915 - Progressing by Milderd Meager, RN Education: Knowledge of disease or condition will improve 07/01/2017 0915 - Progressing by Milderd Meager, RN Knowledge of the prescribed therapeutic regimen will improve 07/01/2017 0915 - Progressing by Milderd Meager, [place] Behavior/Discharge Planning: Ability to manage health-related needs will improve 07/01/2017 0915 - Progressing by Milderd Meager, RN Nutritional: Maintenance of adequate nutrition will improve 07/01/2017 0915 - Progressing by Milderd Meager, RN Respiratory: Ability to maintain a clear airway will improve 07/01/2017 0915 - Progressing by Milderd Meager, RN Levels of oxygenation will improve 07/01/2017 0915 - Progressing by Milderd Meager, RN Ability to maintain adequate  ventilation will improve 07/01/2017 0915 - Progressing by Milderd Meager, RN Complications related to the disease process, condition or treatment will be avoided or minimized 07/01/2017 0915 - Progressing by Milderd Meager, RN Pain Management: Expressions of feelings of enhanced comfort will increase 07/01/2017 0915 - Progressing by Milderd Meager, RN Education: Knowledge of disease or condition will improve 07/01/2017 0915 - Progressing by Milderd Meager, RN Knowledge of the prescribed therapeutic regimen will improve 07/01/2017 0915 - Progressing by Milderd Meager, RN

## 2017-07-01 NOTE — Progress Notes (Signed)
Spoke with patient at bedside. Discussed possible d/c today. Patient has appt with [facility] on 12/5, encouraged him to make sure he keeps that appt. He knows he can go there to get his meds/scripts filled. Provided him with bus passes for getting to clinic and [name]. He is working with [facility] to obtain housing. He plans to return to [facility] until that has been worked out. [phone]

## 2017-07-01 NOTE — Discharge Summary (Signed)
Physician Discharge Summary  Khalel Alms MWU:[id number] DOB: May 28, 1956 DOA: 06/24/2017  PCP: Maren Reamer, MD (Inactive)  Admit date: 06/24/2017 Discharge date: 07/01/2017  Time spent: 35 minutes  Recommendations for Outpatient Follow-up:  Repeat BMET to follow electrolytes and renal function Repeat CT chest in 3 months to follow on lungs nodularity Please assist patient with alcohol and smoking cessation  Reassess BP and adjust antihypertensive regimen as needed   Discharge Diagnoses:  Principal Problem:   Acute respiratory failure with hypoxia ([place]) Active Problems:   Benign essential HTN   COPD exacerbation (HCC)   Alcohol use disorder, severe, dependence ([place])   Malnutrition of moderate degree   Lung nodule   Tobacco abuse   Homelessness   Alcohol use   Discharge Condition: stable and improved. Discharge home with instructions to follow up with PCP on 07/03/17  Diet recommendation: heart healthy diet.  Filed Weights   06/24/17 1626 06/25/17 0059  Weight: 61.2 kg (135 lb) 63 kg (138 lb 14.2 oz)    History of present illness:   61 y.o.malewith medical history significant ofhomelessness; remote CVA and MI; HTN; depression; COPD; and ETOH dependence presenting with cough and SOB.Patient has "just about coughed myself to death. Hurting, my chest, my head." He has also had blurry vision. Symptoms have been going on for a couple of weeks. Seen 10 days ago for COPD exacerbation - given prednisone and azithromycin.Medications helped a little but then the symptoms came back worse. He has never had this before. Mild fevers up to 102, last about a week ago. +SOB with ambulation. Mild ankle edema. Lives at the shelter, several folks with illness. When asked about TB, "that's what they keep telling me down there, to get checked for that. Cause I keep them awake at night coughing."   Hospital Course:  Acute hypoxic respiratory failure secondary to acute COPD  exacerbation.  -Patient also homeless and history of alcohol abuse more common Klebsiella and staph aureus.  -Patient currently in negative pressure room. QuantiFERON gold and AFB smears ordered. first one was indeterminate. Patient with 1 AFB neg (no further expectorations); negative tuberculin test (PPD). -Continue as needed DuoNeb -Discontinue isolation as discussed/recommended by ID; no concerns for TB. -Repeat CT scan of his chest (given prior history of abnormal CT, heavy smoking and hemoptysis) done; demonstrated improvement on prior abnormal section. But demonstrated other area of nodularity in right lung; following rec's form radiology needs to have repeat CT chest in 3 months now. -Patient has completed antibiotic therapy while inpatient -discharge with prescription for COPD maintenance treatment and rescue inhalers.  Alcohol abuse and dependence  -No active withdrawal or seizures appreciated. -observed on CIWA protocol while inpatient -Continue thiamine and folic acid at discharge -Alcohol cessation has been provided.    Tobacco abuse -Patient appreciative for nicotine patch -Interested to quit smoking. -Smoking cessation counseling has been provided and information about heart #1-800 quit NOW also discussed.  Hypertension  -Blood pressure stable and well controlled.   -Will continue Lopressor.  Homeless patient lives in shelter. -Patient is planning to go back to [place] at discharge -[facility].  Physical deconditioning -Appreciate PT/OT assessment; will follow recommendations. -Patient has decided to pursue return back to [place] -Case discussed with case manager for any other potential assistance at the moment of discharge.  Insomnia -Continue trazodone -Stable   Procedures:  See below for x-ray reports   Consultations:  ID  Discharge Exam: Vitals:   06/30/17 2037  07/01/17 0551  BP: 115/65 117/72  Pulse: 76 75  Resp: 18 18  Temp: 98.6 F (37 C) 98.2 F (36.8 C)  SpO2: 97% 98%    Respiratory system: No wheezing, no using accessory muscles, good air movement, scattered rhonchi.  Normal respiratory effort.  Patient with good oxygen saturation on room air.   Cardiovascular system: S1-S2, no rubs, no gallops, no JVD.  Gastrointestinal system: Soft, nontender, nondistended, positive bowel sounds Central nervous system: Alert, awake and oriented x3, cranial nerves grossly intact, no focal motor deficit or neurologic deficit appreciated. Patient following commands properly. Extremities: No edema, no cyanosis, no clubbing. Psychiatry: Patient remain with stable mood, normal judgment and insight.  No suicidal ideation or hallucinations.     Discharge Instructions   Discharge Instructions    Discharge instructions   Complete by:  As directed    Take medications as prescribed  Please stop smoking and stop alcohol consumption  Keep yourself well hydrated  Follow up with PCP as instructed     Allergies as of 07/01/2017      Reactions   Bee Venom Anaphylaxis   Penicillins Rash   Has patient had a PCN reaction causing immediate rash, facial/tongue/throat swelling, SOB or lightheadedness with hypotension: {Yes Has patient had a PCN reaction causing severe rash involving mucus membranes or skin necrosis: YES Has patient had a PCN reaction that required hospitalization Yes Has patient had a PCN reaction occurring within the last 10 years: YES If all of the above answers are "NO", then may proceed with Cephalosporin use.      Medication List    TAKE these medications   albuterol 108 (90 Base) MCG/ACT inhaler Commonly known as:  PROVENTIL HFA;VENTOLIN HFA Inhale 1-2 puffs into the lungs every 4 (four) hours as needed for wheezing.   docusate sodium 100 MG capsule Commonly known as:  COLACE Take 1 capsule (100 mg total) by mouth 2 (two) times  daily.   feeding supplement (ENSURE ENLIVE) Liqd Take 237 mLs by mouth 2 (two) times daily between meals. Start taking on:  07/02/2017   metoprolol tartrate 50 MG tablet Commonly known as:  LOPRESSOR Take 1 tablet (50 mg total) by mouth 2 (two) times daily.   multivitamin with minerals Tabs tablet Take 1 tablet by mouth daily. Start taking on:  07/02/2017   nicotine 14 mg/24hr patch Commonly known as:  NICODERM CQ - dosed in mg/24 hours Place 1 patch (14 mg total) onto the skin daily. Start taking on:  07/02/2017   predniSONE 10 MG tablet Commonly known as:  DELTASONE Take 3 tablets by mouth daily X 2 days; then 2 tablets by mouth daily X 2 days; then 1 tablet by mouth for 2 days and stop prednisone.   tiotropium 18 MCG inhalation capsule Commonly known as:  SPIRIVA HANDIHALER Place 1 capsule (18 mcg total) into inhaler and inhale daily.   traZODone 100 MG tablet Commonly known as:  DESYREL Take 1 tablet (100 mg total) by mouth at bedtime.      Allergies  Allergen Reactions  . Bee Venom Anaphylaxis  . Penicillins Rash    Has patient had a PCN reaction causing immediate rash, facial/tongue/throat swelling, SOB or lightheadedness with hypotension: {Yes Has patient had a PCN reaction causing severe rash involving mucus membranes or skin necrosis: YES Has patient had a PCN reaction that required hospitalization Yes Has patient had a PCN reaction occurring within the last 10  years: YES If all of the above answers are "NO", then may proceed with Cephalosporin use.    Follow-up Information    [place]. Go on 07/03/2017.   Why:  at 2:00pm for an appointment at the [facility].  Please bring all of your medications with you to the appointment.  Contact information: [address] [place] [address] [phone]           The results of significant diagnostics from this hospitalization (including imaging,  microbiology, ancillary and laboratory) are listed below for reference.    Significant Diagnostic Studies: Dg Chest 2 View  Result Date: 06/24/2017 CLINICAL DATA:  Chest pain and shortness of breath EXAM: CHEST  2 VIEW COMPARISON:  06/14/2017 FINDINGS: Normal heart size and mediastinal contours. There is no edema, consolidation, effusion, or pneumothorax. Chronic airway thickening. There were multiple centrilobular/tree-in-bud nodules by 08/21/2016 chest CT, largely occult by radiography. Surgical clips at the GE junction. Remote right anterior sixth and seventh rib fractures. No acute osseous finding. IMPRESSION: 1. No acute finding when compared to prior. 2. Chronic bronchitic markings. Electronically Signed   By: Monte Fantasia M.D.   On: 06/24/2017 17:47   Dg Chest 2 View  Result Date: 06/14/2017 CLINICAL DATA:  Shortness of breath EXAM: CHEST  2 VIEW COMPARISON:  Chest radiograph 05/17/2017 FINDINGS: The heart size and mediastinal contours are within normal limits. Both lungs are clear. The visualized skeletal structures are unremarkable. The lungs are hyperinflated, consistent with COPD. IMPRESSION: Unchanged appearance of the chest without acute cardiopulmonary disease. COPD. Electronically Signed   By: Ulyses Jarred M.D.   On: 06/14/2017 18:44   Ct Chest High Resolution  Result Date: 07/01/2017 CLINICAL DATA:  Emphysema, shortness of breath, hemoptysis, lung nodule. EXAM: CT CHEST WITHOUT CONTRAST TECHNIQUE: Multidetector CT imaging of the chest was performed following the standard protocol without intravenous contrast. High resolution imaging of the lungs, as well as inspiratory and expiratory imaging, was performed. COMPARISON:  08/21/2016. FINDINGS: Cardiovascular: Atherosclerotic calcification of the arterial vasculature, including moderate involvement of the coronary arteries. Heart size normal. No pericardial effusion. Mediastinum/Nodes: Mediastinal lymph nodes are not enlarged by CT  size criteria. Hilar regions are difficult to evaluate without IV contrast. There may be mild distal esophageal wall thickening. Surgical clips in the region of the gastroesophageal junction. Lungs/Pleura: Centrilobular emphysema. When compared with 08/21/2016, there has been overall improvement in diffuse peribronchovascular nodularity with probable residual scarring. An area of irregular nodular consolidation in the apical segment right upper lobe measures roughly 9 x 9 mm (series 10, image 20) and is new from 08/21/2016. Vague subpleural ground-glass in the posterior left upper lobe (image 44), new and nonspecific. Mild subpleural volume loss in the dependent left lower lobe, likely atelectasis. No subpleural reticulation, traction bronchiectasis/bronchiolectasis, ground-glass, architectural show honeycombing. No pleural fluid. Airway is unremarkable. No air trapping. Upper Abdomen: Visualized portions of the liver, gallbladder and right adrenal gland are unremarkable. Fluid density left adrenal nodule measures 11 mm. Visualized portions of the kidneys, spleen, pancreas, stomach and bowel are otherwise grossly unremarkable. No upper abdominal adenopathy. Musculoskeletal: Degenerative changes in the spine. No worrisome lytic or sclerotic lesions. There may be mild compression of the T1 vertebral body superior endplate, unchanged. IMPRESSION: 1. Overall improvement in peribronchovascular nodularity with probable areas of residual scarring. New irregular area of nodular consolidation in the apical segment right upper lobe. Consider repeat chest CT in 3 months as malignancy cannot be definitively  excluded. This recommendation follows the consensus statement: Guidelines for Management of Incidental Pulmonary Nodules Detected on CT Images: From the Fleischner Society 2017; Radiology 2017; 284:228-243. 2. Aortic atherosclerosis (ICD10-170.0). Coronary artery calcification. 3.  Emphysema (ICD10-J43.9). 4. Left adrenal  adenoma. Electronically Signed   By: Lorin Picket M.D.   On: 07/01/2017 08:07    Microbiology: Recent Results (from the past 240 hour(s))  MRSA PCR Screening     Status: None   Collection Time: 06/25/17  1:14 AM  Result Value Ref Range Status   MRSA by PCR NEGATIVE NEGATIVE Final    Comment:        The GeneXpert MRSA Assay (FDA approved for NASAL specimens only), is one component of a comprehensive MRSA colonization surveillance program. It is not intended to diagnose MRSA infection nor to guide or monitor treatment for MRSA infections.   Acid Fast Smear (AFB)     Status: None   Collection Time: 06/25/17  2:02 AM  Result Value Ref Range Status   AFB Specimen Processing Concentration  Final   Acid Fast Smear Negative  Final    Comment: (NOTE) Performed At: [facility] [place], [place] [id number] Rush Farmer MD JT:[id number]    Source (AFB) SPUTUM  Final     Labs: Basic Metabolic Panel: Recent Labs  Lab 06/24/17 1900 06/25/17 0439 06/26/17 0430 06/28/17 0502 07/01/17 0511  NA 137 136 139 137 139  K 3.3* 4.3 4.2 3.8 4.2  CL 103 104 106 105 106  CO2 24 21* 28 27 27   GLUCOSE 93 275* 93 99 96  BUN 6 12 10 14 16   CREATININE 0.75 0.97 0.78 0.74 0.82  CALCIUM 8.6* 8.6* 8.4* 8.4* 8.9  MG  --  1.7  --   --   --    Liver Function Tests: Recent Labs  Lab 06/24/17 1900  AST 20  ALT 13*  ALKPHOS 76  BILITOT 0.6  PROT 7.0  ALBUMIN 3.1*   Recent Labs  Lab 06/24/17 1900  LIPASE 22   CBC: Recent Labs  Lab 06/24/17 1900 06/25/17 0439 06/26/17 0430 06/28/17 0502 07/01/17 0511  WBC 13.0* 11.1* 13.0* 9.1 7.4  NEUTROABS 9.6*  --   --   --   --   HGB 11.0* 9.8* 10.1* 10.9* 11.4*  HCT 34.5* 30.9* 31.9* 34.9* 36.1*  MCV 80.2 80.7 80.6 80.6 79.7  PLT 416* 393 427* 466* 440*   BNP (last 3 results) Recent Labs    06/24/17 1900  BNP 39.6   CBG: Recent Labs  Lab 06/28/17 1846  GLUCAP 97    Signed:  Barton Dubois MD.   [facility] 07/01/2017, 3:57 PM

## 2017-07-01 NOTE — Progress Notes (Signed)
Occupational Therapy Treatment Patient Details Name: Samuel Reyes MRN: [id number] DOB: [birth year] Today's Date: 07/01/2017    History of present illness 61 y.o. male with medical history significant of homelessness; remote CVA and MI; HTN; depression; COPD; and ETOH dependence presenting with cough and SOB; admitted for acute respiratory failure   OT comments    Follow Up Recommendations  Goals met. Pt plans to DC back to [employer] Recommendations  3 in 1 bedside commode           Mobility Bed Mobility Overal bed mobility: Modified Independent                Transfers Overall transfer level: Modified independent Equipment used: None   Sit to Stand: Supervision         General transfer comment: ambulated with S        ADL either performed or assessed with clinical judgement   ADL Overall ADL's : Modified independent                                             Vision Baseline Vision/History: No visual deficits            Cognition Arousal/Alertness: Awake/alert Behavior During Therapy: WFL for tasks assessed/performed Overall Cognitive Status: Within Functional Limits for tasks assessed                                                General Comments  pts feels ready to Dc back to [place]    Pertinent Vitals/ Pain       Pain Assessment: Faces Faces Pain Scale: Hurts little more Pain Location: headache Pain Descriptors / Indicators: Headache Pain Intervention(s): Monitored during session;Limited activity within patient's tolerance;Patient requesting pain meds-RN notified            Progress Toward Goals  OT Goals(current goals can now be found in the care plan section)  Progress towards OT goals: Goals met and updated - see care plan     Plan Discharge plan remains appropriate;All goals met and education completed, patient discharged from [facility] "6 Clicks"  Daily Activity     Outcome Measure   Help from another person eating meals?: None Help from another person taking care of personal grooming?: None Help from another person toileting, which includes using toliet, bedpan, or urinal?: None Help from another person bathing (including washing, rinsing, drying)?: None Help from another person to put on and taking off regular upper body clothing?: None Help from another person to put on and taking off regular lower body clothing?: None 6 Click Score: 24       Activity Tolerance Patient tolerated treatment well   Patient Left in bed;with call bell/phone within reach   Nurse Communication Mobility status;Patient requests pain meds        Time: 1325-1339 OT Time Calculation (min): 14 min  Charges: OT General Charges $OT Visit: 1 Visit OT Treatments $Self Care/Home Management : 8-22 mins  [place], [place]   Betsy Pries 07/01/2017, 1:58 PM

## 2017-07-01 NOTE — Progress Notes (Signed)
Discharge instructions given. Pt verbalized understanding and all questions were answered.  

## 2017-07-02 ENCOUNTER — Telehealth: Payer: Self-pay

## 2017-07-02 NOTE — Telephone Encounter (Signed)
Transitional Care Clinic Post-discharge Follow-Up Phone Call:  Date of Discharge:  07/01/2017 Principal Discharge Diagnosis(es):  Acute respiratory failure, HTN, COPD exacerbation  Post-discharge Communication: (Clearly document all attempts clearly and date contact made) call placed to # [phone] (H) and a HIPAA compliant voicemail message was left requesting a call back to # [phone]. Call also placed to #  [phone] (M) and the call dropped after 8 rings, unable to leave a message.  Call Completed: No    Call placed to [facility], [place] to inquire of the patient is still staying there. She confirmed that he is and will leave a message for him reminding him of his appointment at [facility] on 07/03/17 @ 1400. She will also leave 2 bus passes for him.

## 2017-07-03 ENCOUNTER — Inpatient Hospital Stay: Payer: Self-pay | Admitting: Family Medicine

## 2017-07-04 ENCOUNTER — Telehealth: Payer: Self-pay | Admitting: Internal Medicine

## 2017-07-04 NOTE — Telephone Encounter (Signed)
Transitional Care Clinic Post-discharge Follow-Up Phone Call:  Date of Discharge:  07/01/2017 Principal Discharge Diagnosis(es):  Acute respiratory failure, HTN, COPD exacerbation  Post-discharge Communication: (Clearly document all attempts clearly and date contact made) call placed to # [phone] to check on patient's status. No answer. Left patient a message asking him to return my call at [phone]. Also called [phone], rang several times and then the call dropped.   Call Completed: No

## 2017-07-04 NOTE — Telephone Encounter (Signed)
Call placed to [facility], [place] [phone], regarding patient's status and trying to reschedule patient's appointment. No answer. Mailbox is full and cannot leave a message.

## 2017-07-04 NOTE — Telephone Encounter (Signed)
Error

## 2017-07-09 ENCOUNTER — Encounter: Payer: Self-pay | Admitting: Pediatric Intensive Care

## 2017-07-11 ENCOUNTER — Emergency Department (HOSPITAL_COMMUNITY)
Admission: EM | Admit: 2017-07-11 | Discharge: 2017-07-12 | Disposition: A | Discharge status: Home / Self Care | Payer: Medicaid Other | Attending: Emergency Medicine | Admitting: Emergency Medicine

## 2017-07-11 ENCOUNTER — Other Ambulatory Visit: Payer: Self-pay

## 2017-07-11 ENCOUNTER — Telehealth: Payer: Self-pay

## 2017-07-11 ENCOUNTER — Emergency Department (HOSPITAL_COMMUNITY): Payer: Medicaid Other

## 2017-07-11 DIAGNOSIS — B9789 Other viral agents as the cause of diseases classified elsewhere: Secondary | ICD-10-CM | POA: Insufficient documentation

## 2017-07-11 DIAGNOSIS — I10 Essential (primary) hypertension: Secondary | ICD-10-CM | POA: Insufficient documentation

## 2017-07-11 DIAGNOSIS — J069 Acute upper respiratory infection, unspecified: Secondary | ICD-10-CM | POA: Insufficient documentation

## 2017-07-11 DIAGNOSIS — F17210 Nicotine dependence, cigarettes, uncomplicated: Secondary | ICD-10-CM | POA: Insufficient documentation

## 2017-07-11 DIAGNOSIS — I2510 Atherosclerotic heart disease of native coronary artery without angina pectoris: Secondary | ICD-10-CM | POA: Insufficient documentation

## 2017-07-11 DIAGNOSIS — Z79899 Other long term (current) drug therapy: Secondary | ICD-10-CM | POA: Insufficient documentation

## 2017-07-11 DIAGNOSIS — J449 Chronic obstructive pulmonary disease, unspecified: Secondary | ICD-10-CM | POA: Diagnosis not present

## 2017-07-11 DIAGNOSIS — R0602 Shortness of breath: Secondary | ICD-10-CM | POA: Diagnosis present

## 2017-07-11 MED ORDER — SODIUM CHLORIDE 0.9 % IV BOLUS (SEPSIS)
1000.0000 mL | Freq: Once | INTRAVENOUS | Status: AC
  Administered 2017-07-11: 1000 mL via INTRAVENOUS

## 2017-07-11 MED ORDER — KETOROLAC TROMETHAMINE 30 MG/ML IJ SOLN
30.0000 mg | Freq: Once | INTRAMUSCULAR | Status: AC
  Administered 2017-07-11: 30 mg via INTRAVENOUS
  Filled 2017-07-11: qty 1

## 2017-07-11 MED ORDER — METOCLOPRAMIDE HCL 5 MG/ML IJ SOLN
10.0000 mg | Freq: Once | INTRAMUSCULAR | Status: AC
  Administered 2017-07-11: 10 mg via INTRAVENOUS
  Filled 2017-07-11: qty 2

## 2017-07-11 MED ORDER — IPRATROPIUM-ALBUTEROL 0.5-2.5 (3) MG/3ML IN SOLN
3.0000 mL | Freq: Once | RESPIRATORY_TRACT | Status: AC
  Administered 2017-07-11: 3 mL via RESPIRATORY_TRACT
  Filled 2017-07-11: qty 3

## 2017-07-11 NOTE — Discharge Instructions (Addendum)
Please read and follow all provided instructions.  Your diagnoses today include:  1. Viral URI with cough     Tests performed today include: Vital signs. See below for your results today.   Medications prescribed:  Take as prescribed   Home care instructions:  Follow any educational materials contained in this packet.  Follow-up instructions: Please follow-up with your primary care provider for further evaluation of symptoms and treatment   Return instructions:  Please return to the Emergency Department if you do not get better, if you get worse, or new symptoms OR  - Fever (temperature greater than 101.52F)  - Bleeding that does not stop with holding pressure to the area    -Severe pain (please note that you may be more sore the day after your accident)  - Chest Pain  - Difficulty breathing  - Severe nausea or vomiting  - Inability to tolerate food and liquids  - Passing out  - Skin becoming red around your wounds  - Change in mental status (confusion or lethargy)  - New numbness or weakness    Please return if you have any other emergent concerns.  Additional Information:  Your vital signs today were: BP 111/68    Pulse (!) 102    Temp 98.2 F (36.8 C) (Oral)    Resp (!) 21    Ht 5\' 5"  (1.651 m)    Wt 68 kg (150 lb)    SpO2 96%    BMI 24.96 kg/m  If your blood pressure (BP) was elevated above 135/85 this visit, please have this repeated by your doctor within one month. ---------------

## 2017-07-11 NOTE — ED Notes (Signed)
Pt is hard stick very dry

## 2017-07-11 NOTE — Telephone Encounter (Signed)
Attempted to contact the patient to check on him and to discuss scheduling a follow up appointment at [facility]. Call placed to # [phone] and a HIPAA compliant voicemail message was left requesting a call back to # [phone]. Call placed to # [phone] and the phone rang 9 times, no option to leave a message.   Call placed to [place], RN [place] to inquire if the patient is still staying there. Message left requesting a call back to # [phone]

## 2017-07-11 NOTE — ED Provider Notes (Signed)
[facility] DEPT Provider Note   CSN: [id number] Arrival date & time: 07/11/17  2058     History   Chief Complaint Chief Complaint  Patient presents with  . Shortness of Breath    HPI Samuel Reyes is a 61 y.o. male.  HPI  61 y.o. male with a hx of CAD, COPD, HTN, presents to the Emergency Department today via EMS due to shortness of breath. Notes symptoms x several days. Diffuse body aches. N/V/D. Notes URI symptoms with cough and cold symptoms. Notes productive cough with green and black sputum. Pt smokes. Subjective fevers. No CP/ABD pain. No numbness/tingling. No diaphoresis. Notes frontal headache and rates 5/10. No visual changes. Chart review shows recent DC from hospital on 07-01-17 for acute hypoxic respiratory failure. Negative TB test. Finished ABX therapy for COPD Exacerbation as well as steroids. No other symptoms noted    Past Medical History:  Diagnosis Date  . Alcoholism ([place])   . CAD (coronary artery disease) 2002   MI, no intervention required  . COPD (chronic obstructive pulmonary disease) (HCC)    not on home O2  . Depression   . GERD (gastroesophageal reflux disease)   . Headache   . Hypertension   . Stroke [facility]) 2006    Patient Active Problem List   Diagnosis Date Noted  . Alcohol use   . Homelessness 06/24/2017  . Lung nodule 09/03/2016  . Tobacco abuse 09/03/2016  . Insomnia 09/03/2016  . HCAP (healthcare-associated pneumonia) 08/22/2016  . Bronchiolitis 08/21/2016  . Malnutrition of moderate degree 08/04/2016  . Atrial tachycardia ([place]) 08/02/2016  . Impaired glucose tolerance 08/02/2016  . Alcohol-induced mood disorder ([place]) 12/12/2015  . Syncope 11/15/2015  . Chest pain 11/15/2015  . Nausea vomiting and diarrhea 11/15/2015  . Abdominal pain 11/15/2015  . Major depressive disorder, recurrent episode, moderate with anxious distress ([place]) 10/30/2015  . Alcohol use disorder, severe, dependence ([place]) 10/29/2015   . COPD exacerbation ([place]) 09/20/2015  . Acute respiratory failure with hypoxia ([place]) 09/18/2015  . Suicidal ideation 09/17/2015  . Alcohol intoxication ([place]) 09/17/2015  . Depression 09/17/2015  . Benign essential HTN 09/17/2015  . Hypokalemia 09/17/2015  . Hyponatremia 09/17/2015  . Coffee ground emesis 09/17/2015  . COPD (chronic obstructive pulmonary disease) ([place]) 02/17/2013  . GERD (gastroesophageal reflux disease) 02/17/2013    Past Surgical History:  Procedure Laterality Date  . BACK SURGERY     3 cervical spine surgeries C4-C5 fused  . COLONOSCOPY N/A 01/04/2014   Procedure: COLONOSCOPY;  Surgeon: Danie Binder, MD;  Location: [facility]O [facility];  Service: Endoscopy;  Laterality: N/A;  1:45  . ESOPHAGOGASTRODUODENOSCOPY N/A 01/04/2014   Procedure: ESOPHAGOGASTRODUODENOSCOPY (EGD);  Surgeon: Danie Binder, MD;  Location: [facility] ENDO SUITE;  Service: Endoscopy;  Laterality: N/A;  . FINGER SURGERY Left    2nd, 3rd, & 4th fingers were cut off by table saw and reattached  . GASTRECTOMY    . HERNIA REPAIR    . INCISIONAL HERNIA REPAIR N/A 01/20/2014   Procedure: LAPAROSCOPIC RECURRENT  INCISIONAL HERNIA with mesh;  Surgeon: Edward Jolly, MD;  Location: [facility] ORS;  Service: General;  Laterality: N/A;  . rt knee arthroscopic surgery    . SHOULDER SURGERY Bilateral    3 surgeries on on left, 2 surgeries on right        Home Medications    Prior to Admission medications   Medication Sig Start Date End Date Taking? Authorizing Provider  albuterol (PROVENTIL HFA;VENTOLIN HFA) 108 (  90 Base) MCG/ACT inhaler Inhale 1-2 puffs into the lungs every 4 (four) hours as needed for wheezing. Patient not taking: Reported on 07/11/2017 08/29/16   Arnoldo Morale, MD  docusate sodium (COLACE) 100 MG capsule Take 1 capsule (100 mg total) by mouth 2 (two) times daily. 07/01/17   Barton Dubois, MD  feeding supplement, ENSURE ENLIVE, (ENSURE ENLIVE) LIQD Take 237 mLs by mouth 2 (two) times daily between  meals. 07/02/17   Barton Dubois, MD  metoprolol (LOPRESSOR) 50 MG tablet Take 1 tablet (50 mg total) by mouth 2 (two) times daily. 09/03/16   Arnoldo Morale, MD  Multiple Vitamin (MULTIVITAMIN WITH MINERALS) TABS tablet Take 1 tablet by mouth daily. 07/02/17   Barton Dubois, MD  nicotine (NICODERM CQ - DOSED IN MG/24 HOURS) 14 mg/24hr patch Place 1 patch (14 mg total) onto the skin daily. 07/02/17   Barton Dubois, MD  predniSONE (DELTASONE) 10 MG tablet Take 3 tablets by mouth daily X 2 days; then 2 tablets by mouth daily X 2 days; then 1 tablet by mouth for 2 days and stop prednisone. 07/01/17   Barton Dubois, MD  tiotropium (SPIRIVA HANDIHALER) 18 MCG inhalation capsule Place 1 capsule (18 mcg total) into inhaler and inhale daily. 07/01/17 07/01/18  Barton Dubois, MD  traZODone (DESYREL) 100 MG tablet Take 1 tablet (100 mg total) by mouth at bedtime. 07/01/17   Barton Dubois, MD    Family History Family History  Problem Relation Age of Onset  . Cancer Father        bone  . Cancer Brother        lungs  . Stroke Maternal Grandmother   . Asthma Son        died at age 42 in his sleep   . Spina bifida Son        died at age 37   . Dementia Mother   . Colon cancer Neg Hx     Social History Social History   Tobacco Use  . Smoking status: Current Every Day Smoker    Packs/day: 2.00    Years: 52.00    Pack years: 104.00    Types: Cigarettes    Start date: 07/30/1966  . Smokeless tobacco: Never Used  . Tobacco comment: down to 1/4 ppd  Substance Use Topics  . Alcohol use: Yes    Alcohol/week: 1.8 - 2.4 oz    Types: 3 - 4 Cans of beer per week    Comment: everyday- 3-4 40 oz  . Drug use: No    Comment: denied using any drugs     Allergies   Bee venom and Penicillins   Review of Systems Review of Systems ROS reviewed and all are negative for acute change except as noted in the HPI.  Physical Exam Updated Vital Signs BP 120/76 (BP Location: Right Arm)   Pulse 94   Temp 98.2  F (36.8 C) (Oral)   Resp 16   Ht 5\' 5"  (1.651 m)   Wt 68 kg (150 lb)   SpO2 100%   BMI 24.96 kg/m   Physical Exam  Constitutional: Vital signs are normal. He appears well-developed and well-nourished. No distress.  HENT:  Head: Normocephalic and atraumatic. Head is without raccoon's eyes and without Battle's sign.  Right Ear: No hemotympanum.  Left Ear: No hemotympanum.  Nose: Nose normal.  Mouth/Throat: Uvula is midline, oropharynx is clear and moist and mucous membranes are normal.  Eyes: EOM are normal. Pupils are equal, round, and  reactive to light.  Neck: Trachea normal and normal range of motion. Neck supple. No spinous process tenderness and no muscular tenderness present. No tracheal deviation and normal range of motion present.  Cardiovascular: Normal rate, regular rhythm, S1 normal, S2 normal, normal heart sounds, intact distal pulses and normal pulses.  Pulmonary/Chest: Effort normal and breath sounds normal. No respiratory distress. He has no decreased breath sounds. He has no wheezes. He has no rhonchi. He has no rales.  Abdominal: Normal appearance and bowel sounds are normal. There is no tenderness. There is no rigidity and no guarding.  Musculoskeletal: Normal range of motion.  Neurological: He is alert. He has normal strength. No cranial nerve deficit or sensory deficit.  Skin: Skin is warm and dry.  Psychiatric: He has a normal mood and affect. His speech is normal and behavior is normal.  Nursing note and vitals reviewed.  ED Treatments / Results  Labs (all labs ordered are listed, but only abnormal results are displayed) Labs Reviewed - No data to display  EKG  EKG Interpretation  Date/Time:  Thursday July 11 2017 21:48:55 EST Ventricular Rate:  93 PR Interval:    QRS Duration: 163 QT Interval:  376 QTC Calculation: 468 R Axis:   68 Text Interpretation:  Sinus rhythm Nonspecific intraventricular conduction delay Confirmed by Virgel Manifold [phone])  on 07/11/2017 11:45:34 PM       Radiology Dg Chest 2 View  Result Date: 07/11/2017 CLINICAL DATA:  Initial evaluation for acute shortness of breath. History of asthma, COPD. EXAM: CHEST  2 VIEW COMPARISON:  Prior radiograph from 06/24/2017. FINDINGS: The cardiac and mediastinal silhouettes are stable in size and contour, and remain within normal limits. Aortic atherosclerosis. The lungs are normally inflated. Mild scattered chronic peribronchial thickening. No airspace consolidation, pleural effusion, or pulmonary edema is identified. There is no pneumothorax. No acute osseous abnormality identified. Remotely healed right-sided rib fracture noted. Surgical clips overlie the GE junction. IMPRESSION: 1. Chronic bronchitic changes. No other active cardiopulmonary disease. 2. Aortic atherosclerosis. Electronically Signed   By: Jeannine Boga M.D.   On: 07/11/2017 22:34    Procedures Procedures (including critical care time)  Medications Ordered in ED Medications  sodium chloride 0.9 % bolus 1,000 mL (1,000 mLs Intravenous New Bag/Given 07/11/17 2253)  metoCLOPramide (REGLAN) injection 10 mg (10 mg Intravenous Given 07/11/17 2246)  ketorolac (TORADOL) 30 MG/ML injection 30 mg (30 mg Intravenous Given 07/11/17 2246)  ipratropium-albuterol (DUONEB) 0.5-2.5 (3) MG/3ML nebulizer solution 3 mL (3 mLs Nebulization Given 07/11/17 2215)     Initial Impression / Assessment and Plan / ED Course  I have reviewed the triage vital signs and the nursing notes.  Pertinent labs & imaging results that were available during my care of the patient were reviewed by me and considered in my medical decision making (see chart for details).  Final Clinical Impressions(s) / ED Diagnoses  {I have reviewed and evaluated the relevant laboratory values. {I have reviewed and evaluated the relevant imaging studies. {I have interpreted the relevant EKG. {I have reviewed the relevant previous healthcare records. {I  have reviewed EMS Documentation. {I obtained HPI from historian.   ED Course:  Assessment: Pt is a 61 y.o. male with a hx of CAD, COPD, HTN, presents to the Emergency Department today via EMS due to shortness of breath. Notes symptoms x several days. Diffuse body aches. N/V/D. Notes URI symptoms with cough and cold symptoms. Notes productive cough with green and black sputum. Pt smokes. Subjective fevers.  No CP/ABD pain. No numbness/tingling. No diaphoresis. Notes frontal headache and rates 5/10. No visual changes. Chart review shows recent DC from hospital on 07-01-17 for acute hypoxic respiratory failure. Negative TB test. Finished ABX therapy for COPD Exacerbation as well as steroids. On exam, pt in NAD. Nontoxic/nonseptic appearing. VSS. Afebrile. Lungs CTA. Heart RRR. Abdomen nontender soft. CXR unremarkable. Given neb in ED with moderate improvement. During ED stay, pt refused blood draw. Stated that we would need blood for further evaluation, but pt refused. On reexamination, pt appeared improved after neb treatment. No acute infiltrate on CXR. Recent DC from hospital for COPD exacerbation. Pt is afebrile without infectious symptoms. Pt is homeless. Will DC patient home with follow up to PCP. Likely viral etiology. Plan is to DC home with follow up toPCP. At time of discharge, Patient is in no acute distress. Vital Signs are stable. Patient is able to ambulate. Patient able to tolerate PO.   Disposition/Plan:  DC Home Additional Verbal discharge instructions given and discussed with patient.  Pt Instructed to f/u with PCP in the next week for evaluation and treatment of symptoms. Return precautions given Pt acknowledges and agrees with plan  Supervising Physician Virgel Manifold, MD  Final diagnoses:  Viral URI with cough    ED Discharge Orders    None       Shary Decamp, PA-C 07/12/17 0006    Virgel Manifold, MD 07/16/17 1123

## 2017-07-11 NOTE — ED Triage Notes (Signed)
Pt arrived via [facility] with complaints of shortness of breath. Pt is a smoker and complains of a headache. Pt also has hx of COPD and Asthma with complaints of coughing up and emesis of  green black color Vitals per EMS 126/92 100% 104 20RR 154 CBG

## 2017-07-11 NOTE — ED Notes (Signed)
Attempted to get blood from patient but was unsuccessful and does not want to be stuck anymore after he was stuck multiple times for an IV

## 2017-07-11 NOTE — ED Notes (Signed)
Writer went to obtain labs but the tech Bonnita Nasuti in the assignment informed Probation officer they would obtain labs once patient returned from x-ray.

## 2017-07-12 ENCOUNTER — Telehealth: Payer: Self-pay

## 2017-07-12 MED ORDER — PREDNISONE 20 MG PO TABS
60.0000 mg | ORAL_TABLET | Freq: Once | ORAL | Status: AC
  Administered 2017-07-12: 60 mg via ORAL
  Filled 2017-07-12: qty 3

## 2017-07-12 NOTE — Telephone Encounter (Signed)
Call received from [place], [place]. She stated that the patient has been at [facility]. He lost his bed and they are in the process of trying to secure him another bed. [place] stated that he has prednisone and metoprolol but she is not sure that he has all of his medications and if he is taking them. He has a spacer but she is not sure that he has his inhaler,  He is supposed to see her again on Monday, 07/15/17.  She was requesting another appointment for the patient at [facility] and an appointment was scheduled for 07/16/17 @ 1400. She said that she will notify the patient of the appointment and provide cab transportation for him to the clinic and [facility] can provide cab transportation for him back to [facility] after his appointment.  She has also referred him back to [facility] for behavioral health issues and has encouraged him to see her on Monday, 07/15/17

## 2017-07-15 ENCOUNTER — Encounter: Payer: Self-pay | Admitting: Pediatric Intensive Care

## 2017-07-15 NOTE — Congregational Nurse Program (Signed)
Congregational Nurse Program Note  Date of Encounter: 07/09/2017  Past Medical History: Past Medical History:  Diagnosis Date  . Alcoholism ([place])   . CAD (coronary artery disease) 2002   MI, no intervention required  . COPD (chronic obstructive pulmonary disease) (HCC)    not on home O2  . Depression   . GERD (gastroesophageal reflux disease)   . Headache   . Hypertension   . Stroke [facility]) 2006    Encounter Details: CNP Questionnaire - 07/09/17 0930      Questionnaire   Patient Status  Not Applicable    Race  White or Caucasian    Location Patient [place]  Medicaid    Uninsured  Not Applicable    Food  Yes, have food insecurities    Housing/Utilities  No permanent housing    Transportation  Yes, need transportation assistance    Interpersonal Safety  No, do not feel physically and emotionally safe where you currently live    Medication  Yes, have medication insecurities    Medical Provider  Yes    Referrals  Primary Care Provider/Clinic    ED Visit Averted  Not Applicable    Life-Saving Intervention Made  Not Applicable     New client encounter- client states that he was in the hospital for treatment of breathing infection. States that he has not been able to arrange follow up appointment. He states that he receives Medicaid and disability. He states that he doesn't care if he "goes on living" and that he wishes he would "just die" but does not have a suicide plan. CN reassured client that there are people who care about him and that he has worth. CN will contact [facility] to let her know that client has been seen in shelter clinic and that CN will assure that client can follow up at [facility].

## 2017-07-15 NOTE — Congregational Nurse Program (Signed)
Congregational Nurse Program Note  Date of Encounter: 07/15/2017  Past Medical History: Past Medical History:  Diagnosis Date  . Alcoholism ([place])   . CAD (coronary artery disease) 2002   MI, no intervention required  . COPD (chronic obstructive pulmonary disease) (HCC)    not on home O2  . Depression   . GERD (gastroesophageal reflux disease)   . Headache   . Hypertension   . Stroke [facility]) 2006    Encounter Details: CNP Questionnaire - 07/15/17 0900      Questionnaire   Patient Status  Not Applicable    Race  White or Caucasian    Location Patient [place]  Medicaid    Uninsured  Not Applicable    Food  Yes, have food insecurities    Housing/Utilities  No permanent housing    Transportation  Yes, need transportation assistance    Interpersonal Safety  No, do not feel physically and emotionally safe where you currently live    Medication  Yes, have medication insecurities    Medical Provider  Yes    Referrals  Primary Care Provider/Clinic    ED Visit Averted  Not Applicable    Life-Saving Intervention Made  Not Applicable     BP check, safety check. Client agrees to go to appointment at [facility] tomorrow afternoon. CN gave written appointment information and client will return to clinic in AM for taxi voucher.

## 2017-07-16 ENCOUNTER — Telehealth: Payer: Self-pay

## 2017-07-16 ENCOUNTER — Encounter: Payer: Self-pay | Admitting: Pediatric Intensive Care

## 2017-07-16 ENCOUNTER — Ambulatory Visit: Payer: Medicaid Other | Admitting: Licensed Clinical Social Worker

## 2017-07-16 ENCOUNTER — Ambulatory Visit: Payer: Medicaid Other | Attending: Family Medicine | Admitting: Family Medicine

## 2017-07-16 ENCOUNTER — Encounter: Payer: Self-pay | Admitting: Family Medicine

## 2017-07-16 VITALS — BP 147/82 | HR 95 | Temp 97.7°F | Ht 64.0 in | Wt 149.6 lb

## 2017-07-16 DIAGNOSIS — Z5900 Homelessness unspecified: Secondary | ICD-10-CM

## 2017-07-16 DIAGNOSIS — F17200 Nicotine dependence, unspecified, uncomplicated: Secondary | ICD-10-CM | POA: Diagnosis not present

## 2017-07-16 DIAGNOSIS — Z91030 Bee allergy status: Secondary | ICD-10-CM | POA: Diagnosis not present

## 2017-07-16 DIAGNOSIS — F331 Major depressive disorder, recurrent, moderate: Secondary | ICD-10-CM

## 2017-07-16 DIAGNOSIS — Z880 Allergy status to penicillin: Secondary | ICD-10-CM | POA: Insufficient documentation

## 2017-07-16 DIAGNOSIS — R45851 Suicidal ideations: Secondary | ICD-10-CM | POA: Insufficient documentation

## 2017-07-16 DIAGNOSIS — Z8673 Personal history of transient ischemic attack (TIA), and cerebral infarction without residual deficits: Secondary | ICD-10-CM | POA: Diagnosis not present

## 2017-07-16 DIAGNOSIS — M25561 Pain in right knee: Secondary | ICD-10-CM | POA: Insufficient documentation

## 2017-07-16 DIAGNOSIS — Z981 Arthrodesis status: Secondary | ICD-10-CM | POA: Insufficient documentation

## 2017-07-16 DIAGNOSIS — Z9889 Other specified postprocedural states: Secondary | ICD-10-CM | POA: Insufficient documentation

## 2017-07-16 DIAGNOSIS — Z720 Tobacco use: Secondary | ICD-10-CM

## 2017-07-16 DIAGNOSIS — J449 Chronic obstructive pulmonary disease, unspecified: Secondary | ICD-10-CM | POA: Insufficient documentation

## 2017-07-16 DIAGNOSIS — I10 Essential (primary) hypertension: Secondary | ICD-10-CM | POA: Diagnosis present

## 2017-07-16 DIAGNOSIS — R51 Headache: Secondary | ICD-10-CM | POA: Insufficient documentation

## 2017-07-16 DIAGNOSIS — G8929 Other chronic pain: Secondary | ICD-10-CM | POA: Diagnosis not present

## 2017-07-16 DIAGNOSIS — Z903 Acquired absence of stomach [part of]: Secondary | ICD-10-CM | POA: Insufficient documentation

## 2017-07-16 DIAGNOSIS — R911 Solitary pulmonary nodule: Secondary | ICD-10-CM | POA: Diagnosis not present

## 2017-07-16 MED ORDER — MELOXICAM 7.5 MG PO TABS: 7.5000 mg | ORAL_TABLET | Freq: Every day | ORAL | 1 refills | Status: DC

## 2017-07-16 MED FILL — !VENTOLIN HFA INHALER: 108 (90 BAS | 16 days supply | Qty: 18 | Fill #0

## 2017-07-16 MED FILL — traZODone HCL 100 MG TABS: 100 | 30 days supply | Qty: 30 | Fill #0

## 2017-07-16 MED FILL — ?METOPROLOL TARTRATE 50MG T: 50 | 30 days supply | Qty: 60 | Fill #0

## 2017-07-16 MED FILL — ?PANTOPRAZOLE SOD DR 40MG: 40 MG | 30 days supply | Qty: 30 | Fill #0

## 2017-07-16 MED FILL — MELOXICAM 7.5 MG TABLET: 7.5 | 30 days supply | Qty: 30 | Fill #0

## 2017-07-16 MED FILL — !FLOVENT HFA 110 MCG INHALE: 110 | 60 days supply | Qty: 12 | Fill #0

## 2017-07-16 MED FILL — busPIRone HCL 7.5 MG TABS: 7.5 | 30 days supply | Qty: 60 | Fill #0

## 2017-07-16 NOTE — Progress Notes (Signed)
Pt has not had any medication in 2 weeks.

## 2017-07-16 NOTE — Congregational Nurse Program (Signed)
Congregational Nurse Program Note  Date of Encounter: 07/16/2017  Past Medical History: Past Medical History:  Diagnosis Date  . Alcoholism ([place])   . CAD (coronary artery disease) 2002   MI, no intervention required  . COPD (chronic obstructive pulmonary disease) (HCC)    not on home O2  . Depression   . GERD (gastroesophageal reflux disease)   . Headache   . Hypertension   . Stroke [facility]) 2006    Encounter Details: CNP Questionnaire - 07/16/17 0845      Questionnaire   Patient Status  Not Applicable    Race  White or Caucasian    Location Patient [place]  Medicaid    Uninsured  Not Applicable    Food  Yes, have food insecurities    Housing/Utilities  No permanent housing    Transportation  Yes, need transportation assistance;Provided transportation assistance (bus pass, taxi voucher, etc.)    Interpersonal Safety  No, do not feel physically and emotionally safe where you currently live    Medication  Yes, have medication insecurities    Medical Provider  Yes    Referrals  Primary Care Provider/Clinic    ED Visit Averted  Not Applicable    Life-Saving Intervention Made  Not Applicable     Client given taxi voucher for [facility] appointment with instructions to to meet taxi at 1:15. [employer] taxi dispatcher notified. Client states that he is going to [employer] and requests bus passes. CN asks client to follow up in clinic on Friday.

## 2017-07-16 NOTE — Patient Instructions (Signed)

## 2017-07-16 NOTE — Progress Notes (Signed)
Subjective:  Patient ID: Samuel Reyes, male    DOB: 03-Jan-1956  Age: 61 y.o. MRN: [id number]  CC: Hospitalization Follow-up   HPI Samuel Reyes is a 61 year old male with a history of COPD, tobacco abuse, hypertension, insomnia presents  Who presents for a follow-up visit after hospitalization for COPD exacerbation from 06/24/17 through 07/01/17 after hospitalization for acute hypoxic respiratory failure secondary to COPD exacerbation.  He had presented with chest pain, headaches, cough, fevers and QuantiFERON gold was indeterminate, AFB was negative, PPD was negative. Seen by ID and no concerns for TB. He was treated with nebulizer treatments and antibiotics and subsequently discharged.  CT chest 06/29/17: IMPRESSION: 1. Overall improvement in peribronchovascular nodularity with probable areas of residual scarring. New irregular area of nodular consolidation in the apical segment right upper lobe. Consider repeat chest CT in 3 months as malignancy cannot be definitively excluded. This recommendation follows the consensus statement: Guidelines for Management of Incidental Pulmonary Nodules Detected on CT Images: From the Fleischner Society 2017; Radiology 2017; 284:228-243. 2. Aortic atherosclerosis (ICD10-170.0). Coronary artery calcification. 3.  Emphysema (ICD10-J43.9). 4. Left adrenal adenoma.  He presented to the ED again 5 days ago and was treated for viral URI where he received Nebulizer treatments, IV fluids, Toradol.  He presents today and is yet to pick up any of his medications from the pharmacy. Complains his head and chest hurt. He is depressed at this time and has a lot of anxiety due to the fact that he is homeless and indicates on his [place] 9 he wishes he were dead. He has a history of multiple psychological trauma- in the last 5 years he has bury 2 sons and his wife; he has a previous history of suicidal attempt but denies any plans for suicide at this time. He has  an upcoming appointment at [facility]. Also complains of right knee pain which is chronic and worse with the cold weather.  Past Medical History:  Diagnosis Date  . Alcoholism ([place])   . CAD (coronary artery disease) 2002   MI, no intervention required  . COPD (chronic obstructive pulmonary disease) (HCC)    not on home O2  . Depression   . GERD (gastroesophageal reflux disease)   . Headache   . Hypertension   . Stroke [facility]) 2006    Past Surgical History:  Procedure Laterality Date  . BACK SURGERY     3 cervical spine surgeries C4-C5 fused  . COLONOSCOPY N/A 01/04/2014   Procedure: COLONOSCOPY;  Surgeon: Danie Binder, MD;  Location: [facility];  Service: Endoscopy;  Laterality: N/A;  1:45  . ESOPHAGOGASTRODUODENOSCOPY N/A 01/04/2014   Procedure: ESOPHAGOGASTRODUODENOSCOPY (EGD);  Surgeon: Danie Binder, MD;  Location: [facility];  Service: Endoscopy;  Laterality: N/A;  . FINGER SURGERY Left    2nd, 3rd, & 4th fingers were cut off by table saw and reattached  . GASTRECTOMY    . HERNIA REPAIR    . INCISIONAL HERNIA REPAIR N/A 01/20/2014   Procedure: LAPAROSCOPIC RECURRENT  INCISIONAL HERNIA with mesh;  Surgeon: Edward Jolly, MD;  Location: [facility] ORS;  Service: General;  Laterality: N/A;  . rt knee arthroscopic surgery    . SHOULDER SURGERY Bilateral    3 surgeries on on left, 2 surgeries on right     Allergies  Allergen Reactions  . Bee Venom Anaphylaxis  . Penicillins Rash    Has patient had a PCN reaction causing immediate rash, facial/tongue/throat swelling, SOB or lightheadedness with  hypotension: {Yes Has patient had a PCN reaction causing severe rash involving mucus membranes or skin necrosis: YES Has patient had a PCN reaction that required hospitalization Yes Has patient had a PCN reaction occurring within the last 10 years: YES If all of the above answers are "NO", then may proceed with Cephalosporin use.      Outpatient Medications Prior to Visit    Medication Sig Dispense Refill  . albuterol (PROVENTIL HFA;VENTOLIN HFA) 108 (90 Base) MCG/ACT inhaler Inhale 1-2 puffs into the lungs every 4 (four) hours as needed for wheezing. (Patient not taking: Reported on 07/11/2017) 1 Inhaler 1  . docusate sodium (COLACE) 100 MG capsule Take 1 capsule (100 mg total) by mouth 2 (two) times daily. (Patient not taking: Reported on 07/16/2017) 60 capsule 0  . feeding supplement, ENSURE ENLIVE, (ENSURE ENLIVE) LIQD Take 237 mLs by mouth 2 (two) times daily between meals. (Patient not taking: Reported on 07/16/2017) 237 mL 12  . metoprolol (LOPRESSOR) 50 MG tablet Take 1 tablet (50 mg total) by mouth 2 (two) times daily. (Patient not taking: Reported on 07/16/2017) 60 tablet 1  . Multiple Vitamin (MULTIVITAMIN WITH MINERALS) TABS tablet Take 1 tablet by mouth daily. (Patient not taking: Reported on 07/16/2017) 30 tablet 1  . nicotine (NICODERM CQ - DOSED IN MG/24 HOURS) 14 mg/24hr patch Place 1 patch (14 mg total) onto the skin daily. (Patient not taking: Reported on 07/16/2017) 28 patch 0  . predniSONE (DELTASONE) 10 MG tablet Take 3 tablets by mouth daily X 2 days; then 2 tablets by mouth daily X 2 days; then 1 tablet by mouth for 2 days and stop prednisone. (Patient not taking: Reported on 07/16/2017) 12 tablet 0  . tiotropium (SPIRIVA HANDIHALER) 18 MCG inhalation capsule Place 1 capsule (18 mcg total) into inhaler and inhale daily. (Patient not taking: Reported on 07/16/2017) 30 capsule 2  . traZODone (DESYREL) 100 MG tablet Take 1 tablet (100 mg total) by mouth at bedtime. (Patient not taking: Reported on 07/16/2017)     No facility-administered medications prior to visit.     ROS Review of Systems  Constitutional: Negative for activity change and appetite change.  HENT: Negative for sinus pressure and sore throat.   Eyes: Negative for visual disturbance.  Respiratory: Negative for cough, chest tightness and shortness of breath.   Cardiovascular:  Positive for chest pain. Negative for leg swelling.  Gastrointestinal: Negative for abdominal distention, abdominal pain, constipation and diarrhea.  Endocrine: Negative.   Genitourinary: Negative for dysuria.  Musculoskeletal:       See hpi  Skin: Negative for rash.  Allergic/Immunologic: Negative.   Neurological: Positive for headaches. Negative for weakness, light-headedness and numbness.  Psychiatric/Behavioral: Negative for dysphoric mood and suicidal ideas.    Objective:  BP (!) 147/82   Pulse 95   Temp 97.7 F (36.5 C) (Oral)   Ht 5\' 4"  (1.626 m)   Wt 149 lb 9.6 oz (67.9 kg)   SpO2 95%   BMI 25.68 kg/m   BP/Weight 07/16/2017 07/15/2017 30/16/0109  Systolic BP 323 557 322  Diastolic BP 82 68 75  Wt. (Lbs) 149.6 - -  BMI 25.68 - -  Some encounter information is confidential and restricted. Go to Review Flowsheets activity to see all data.      Physical Exam  Constitutional: He is oriented to person, place, and time. He appears well-developed and well-nourished.  Cardiovascular: Normal rate, normal heart sounds and intact distal pulses.  No murmur heard. Pulmonary/Chest: Effort  normal and breath sounds normal. He has no wheezes. He has no rales. He exhibits no tenderness.  Abdominal: Soft. Bowel sounds are normal. He exhibits no distension and no mass. There is no tenderness.  Musculoskeletal: Normal range of motion.  Neurological: He is alert and oriented to person, place, and time.  Skin: Skin is warm and dry.  Psychiatric:  Dysphoric mood     Assessment & Plan:   1. Benign essential HTN Uncontrolled Secondary to not picking up his medications which I have advised him to  2. Chronic obstructive pulmonary disease, unspecified COPD type ([place]) Still symptomatic Advised to pick up inhalers from the pharmacy We have spoken with the pharmacist and will ensure he can get them prior to his discharge today  3. Lung nodule Requires follow-up CT in 3 months-due  in 09/2017 His smoking puts him at high risk  4. Major depressive disorder, recurrent episode, moderate with anxious distress (HCC) Uncontrolled Exacerbated by underlying social situations and stresses He has chronic suicidal ideations LCSW has seen the patient and we have arranged for transportation for him to get to [facility] tomorrow morning  5. Homelessness  6. Tobacco abuse 3 minutes counseling on cessation and he is not ready to quit  7. Chronic pain of right knee Placed on meloxicam Likely underlying osteoarthritis - DG Knee Complete 4 Views Right; Future   Meds ordered this encounter  Medications  . meloxicam (MOBIC) 7.5 MG tablet    Sig: Take 1 tablet (7.5 mg total) by mouth daily.    Dispense:  30 tablet    Refill:  1    Follow-up: Return in about 3 weeks (around 08/06/2017) for TCC - follow up on COPD.   Arnoldo Morale MD

## 2017-07-16 NOTE — BH Specialist Note (Signed)
Integrated Behavioral Health Follow Up Visit  MRN: [id number] Name: Samuel Reyes  Number of [facility] Clinician visits: 3/6 Session Start time: 3:30 PM  Session End time: 4:00 PM Total time: 30 minutes  Type of Service: [place] Interpretor:No. Interpretor Name and Language: N.A  SUBJECTIVE: Samuel Reyes is a 61 y.o. male accompanied by self Patient was referred by Dr. Jarold Song for depression and anxiety. Patient reports the following symptoms/concerns: overwhelming feelings of sadness and worry, difficulty sleeping, low energy, decreased concentration, feeling bad about self, irritability, and suicidal ideations Duration of problem: Ongoing; Severity of problem: severe  OBJECTIVE: Mood: Dysphoric and Affect: Depressed Risk of harm to self or others: Suicidal ideation Suicide plan Jump in front of a moving vehicle No plan to harm self or others  LIFE CONTEXT: Family and Social: Pt has a strained relationship with his family and reports having no friends. He has been residing at the [facility] for approx 3 weeks, where he receives assistance from staff School/Work: Pt receives [place] [phone]) and Food Stamps Self-Care: Pt reports a decrease in alcohol use. States he drinks "a couple of beers every now and then" Life Changes: Pt grieves the loss of his wife, two sons, and step-sister. He has ongoing medical concerns, limited support, and is now residing in a homeless shelter.   GOALS ADDRESSED: Patient will: 1.  Reduce symptoms of: anxiety and depression  2.  Increase knowledge and/or ability of: coping skills and healthy habits  3.  Demonstrate ability to: Increase adequate support systems for patient/family, Increase motivation to adhere to plan of care and Decrease self-medicating behaviors  INTERVENTIONS: Interventions utilized:  Motivational Interviewing, Supportive Counseling, Psychoeducation and/or Health  Education and Link to [employer] Standardized Assessments completed: GAD-7 and PHQ 2&9 with C-SSRS  ASSESSMENT: Patient currently experiencing depression and anxiety triggered by ongoing medical concerns, the loss of multiple family members, and current homelessness. He reports overwhelming feelings of sadness and worry, difficulty sleeping, low energy, decreased concentration, feeling bad about self, irritability, and suicidal ideations. Pt shared a plan to jump in front of a moving vehicle; however, reports no intent to harm self or others. LCSWA inquired protective factors with pt, assisted in creating a safety plan, and provided resources for crisis intervention. Pt agreed to go to [facility] on 07/18/17 and was provided transportation assistance.    Patient may benefit from psychotherapy and medication management. LCSWA commended pt for coming to his appointment and reporting a decrease in substance use. Pt shared his goals are to obtain independent housing. LCSWA informed pt on the [employer] and provided transportation assistance to initiate services. Pt plans to complete SCAT application with the assistance from [place].   PLAN: 1. Follow up with behavioral health clinician on : Pt was encouraged tocontact LCSWA if symptoms worsen or fail to improveto schedule behavioral appointments at [facility]. 2. Behavioral recommendations: LCSWA recommends that pt follow through with safety plan in crisis, re-initiate services with [facility], and utilize community resources.  3. Referral(s): Armed forces logistics/support/administrative officer (LME/Outside Clinic) and [employer]:  Housing and Transportation 4. "From scale of 1-10, how likely are you to follow plan?": 10/10  Rebekah Chesterfield, LCSW 07/18/17 10:02 AM

## 2017-07-16 NOTE — Telephone Encounter (Signed)
Met with the patient when he was in the clinic today to see Dr Jarold Song. Reminded him that he needs to get his medications from [place] prior to leaving and [facility] will provide a cab for him to get back to [facility].  He then stated that he doesn't want to live any longer. When asked if he had a plan to carry that out he said he would jump in front of a car.  He then explained that he is going to call [employer] and has bus passes to get him to/from [place]. Informed him that Dr Jarold Song would be notified of his feelings about not wanting to live and also explained that Christa See, [place] is available to see him also.    Update provided to Dr Benita Gutter not seen the patient yet and update also provided to Doloris Hall, [place].

## 2017-07-17 ENCOUNTER — Encounter: Payer: Self-pay | Admitting: Family Medicine

## 2017-07-19 ENCOUNTER — Encounter: Payer: Self-pay | Admitting: Pediatric Intensive Care

## 2017-07-26 ENCOUNTER — Encounter (HOSPITAL_COMMUNITY): Payer: Self-pay | Admitting: Emergency Medicine

## 2017-07-26 ENCOUNTER — Telehealth: Payer: Self-pay | Admitting: Family Medicine

## 2017-07-26 ENCOUNTER — Emergency Department (HOSPITAL_COMMUNITY)
Admission: EM | Admit: 2017-07-26 | Discharge: 2017-07-26 | Disposition: A | Discharge status: Home / Self Care | Payer: Medicaid Other | Attending: Emergency Medicine | Admitting: Emergency Medicine

## 2017-07-26 ENCOUNTER — Emergency Department (HOSPITAL_COMMUNITY): Payer: Medicaid Other

## 2017-07-26 DIAGNOSIS — J449 Chronic obstructive pulmonary disease, unspecified: Secondary | ICD-10-CM | POA: Diagnosis not present

## 2017-07-26 DIAGNOSIS — Z880 Allergy status to penicillin: Secondary | ICD-10-CM | POA: Diagnosis not present

## 2017-07-26 DIAGNOSIS — J441 Chronic obstructive pulmonary disease with (acute) exacerbation: Secondary | ICD-10-CM

## 2017-07-26 DIAGNOSIS — Z79899 Other long term (current) drug therapy: Secondary | ICD-10-CM | POA: Diagnosis not present

## 2017-07-26 DIAGNOSIS — F17210 Nicotine dependence, cigarettes, uncomplicated: Secondary | ICD-10-CM | POA: Diagnosis not present

## 2017-07-26 DIAGNOSIS — I119 Hypertensive heart disease without heart failure: Secondary | ICD-10-CM | POA: Insufficient documentation

## 2017-07-26 DIAGNOSIS — R0602 Shortness of breath: Secondary | ICD-10-CM | POA: Diagnosis present

## 2017-07-26 DIAGNOSIS — J40 Bronchitis, not specified as acute or chronic: Secondary | ICD-10-CM | POA: Diagnosis not present

## 2017-07-26 LAB — BASIC METABOLIC PANEL
ANION GAP: 5 (ref 5–15)
BUN: 13 mg/dL (ref 6–20)
CO2: 25 mmol/L (ref 22–32)
Calcium: 8.5 mg/dL — ABNORMAL LOW (ref 8.9–10.3)
Chloride: 108 mmol/L (ref 101–111)
Creatinine, Ser: 0.82 mg/dL (ref 0.61–1.24)
GLUCOSE: 92 mg/dL (ref 65–99)
POTASSIUM: 3.8 mmol/L (ref 3.5–5.1)
SODIUM: 138 mmol/L (ref 135–145)

## 2017-07-26 LAB — CBC
HCT: 33.2 % — ABNORMAL LOW (ref 39.0–52.0)
Hemoglobin: 10.6 g/dL — ABNORMAL LOW (ref 13.0–17.0)
MCH: 24.9 pg — ABNORMAL LOW (ref 26.0–34.0)
MCHC: 31.9 g/dL (ref 30.0–36.0)
MCV: 78.1 fL (ref 78.0–100.0)
PLATELETS: 324 10*3/uL (ref 150–400)
RBC: 4.25 MIL/uL (ref 4.22–5.81)
RDW: 17.3 % — ABNORMAL HIGH (ref 11.5–15.5)
WBC: 9 10*3/uL (ref 4.0–10.5)

## 2017-07-26 MED ORDER — DEXTROSE 5 % IV SOLN
1.0000 g | Freq: Once | INTRAVENOUS | Status: AC
  Administered 2017-07-26: 1 g via INTRAVENOUS
  Filled 2017-07-26: qty 10

## 2017-07-26 MED ORDER — ALBUTEROL SULFATE (2.5 MG/3ML) 0.083% IN NEBU
5.0000 mg | INHALATION_SOLUTION | Freq: Once | RESPIRATORY_TRACT | Status: AC
  Administered 2017-07-26: 5 mg via RESPIRATORY_TRACT
  Filled 2017-07-26: qty 6

## 2017-07-26 MED ORDER — PREDNISONE 20 MG PO TABS
60.0000 mg | ORAL_TABLET | Freq: Once | ORAL | Status: AC
  Administered 2017-07-26: 60 mg via ORAL
  Filled 2017-07-26: qty 3

## 2017-07-26 MED ORDER — DEXTROSE 5 % IV SOLN
500.0000 mg | Freq: Once | INTRAVENOUS | Status: AC
  Administered 2017-07-26: 500 mg via INTRAVENOUS
  Filled 2017-07-26: qty 500

## 2017-07-26 MED ORDER — ALBUTEROL SULFATE HFA 108 (90 BASE) MCG/ACT IN AERS: 2.0000 | INHALATION_SPRAY | RESPIRATORY_TRACT | 3 refills | Status: DC | PRN

## 2017-07-26 MED ORDER — IPRATROPIUM BROMIDE 0.02 % IN SOLN
0.5000 mg | Freq: Once | RESPIRATORY_TRACT | Status: AC
  Administered 2017-07-26: 0.5 mg via RESPIRATORY_TRACT
  Filled 2017-07-26: qty 2.5

## 2017-07-26 MED ORDER — AZITHROMYCIN 250 MG PO TABS: ORAL_TABLET | ORAL | 0 refills | Status: DC

## 2017-07-26 MED ORDER — ALBUTEROL SULFATE (2.5 MG/3ML) 0.083% IN NEBU: 5.0000 mg | INHALATION_SOLUTION | Freq: Once | RESPIRATORY_TRACT | Status: DC

## 2017-07-26 MED ORDER — PREDNISONE 20 MG PO TABS: 60.0000 mg | ORAL_TABLET | Freq: Every day | ORAL | 0 refills | Status: DC

## 2017-07-26 NOTE — Telephone Encounter (Signed)
Call placed to patient [phone] to check on his status. Didn't realize that he might still be in the ED. Spoke with Berenice Primas at the [facility] and she stated that patient wasn't there but would leave him a reminder letting him know that I called and to call me back at [phone].

## 2017-07-26 NOTE — ED Provider Notes (Signed)
[place] DEPT Provider Note   CSN: [id number] Arrival date & time: 07/26/17  5329     History   Chief Complaint Chief Complaint  Patient presents with  . Shortness of Breath    HPI Samuel Reyes is a 61 y.o. male.  Patient with hx copd, c/o increased productive cough and sob in past few days. Subjective fever. Symptoms moderate, persistent, worse in past day. No chest pain. Denies sore throat. +smoker. w copd, denies prior icu stay or intubation. No leg pain or swelling. No orthopnea.    The history is provided by the patient.  Shortness of Breath  Associated symptoms include a fever, cough and wheezing. Pertinent negatives include no headaches, no sore throat, no neck pain, no chest pain, no vomiting, no abdominal pain, no rash and no leg swelling.    Past Medical History:  Diagnosis Date  . Alcoholism ([place])   . CAD (coronary artery disease) 2002   MI, no intervention required  . COPD (chronic obstructive pulmonary disease) (HCC)    not on home O2  . Depression   . GERD (gastroesophageal reflux disease)   . Headache   . Hypertension   . Stroke [facility]) 2006    Patient Active Problem List   Diagnosis Date Noted  . Alcohol use   . Homelessness 06/24/2017  . Lung nodule 09/03/2016  . Tobacco abuse 09/03/2016  . Insomnia 09/03/2016  . HCAP (healthcare-associated pneumonia) 08/22/2016  . Bronchiolitis 08/21/2016  . Malnutrition of moderate degree 08/04/2016  . Atrial tachycardia ([place]) 08/02/2016  . Impaired glucose tolerance 08/02/2016  . Alcohol-induced mood disorder ([place]) 12/12/2015  . Syncope 11/15/2015  . Chest pain 11/15/2015  . Nausea vomiting and diarrhea 11/15/2015  . Abdominal pain 11/15/2015  . Major depressive disorder, recurrent episode, moderate with anxious distress ([place]) 10/30/2015  . Alcohol use disorder, severe, dependence ([place]) 10/29/2015  . COPD exacerbation ([place]) 09/20/2015  . Acute respiratory failure with  hypoxia ([place]) 09/18/2015  . Suicidal ideation 09/17/2015  . Alcohol intoxication ([place]) 09/17/2015  . Depression 09/17/2015  . Benign essential HTN 09/17/2015  . Hypokalemia 09/17/2015  . Hyponatremia 09/17/2015  . Coffee ground emesis 09/17/2015  . COPD (chronic obstructive pulmonary disease) ([place]) 02/17/2013  . GERD (gastroesophageal reflux disease) 02/17/2013    Past Surgical History:  Procedure Laterality Date  . BACK SURGERY     3 cervical spine surgeries C4-C5 fused  . COLONOSCOPY N/A 01/04/2014   Procedure: COLONOSCOPY;  Surgeon: Danie Binder, MD;  Location: [facility];  Service: Endoscopy;  Laterality: N/A;  1:45  . ESOPHAGOGASTRODUODENOSCOPY N/A 01/04/2014   Procedure: ESOPHAGOGASTRODUODENOSCOPY (EGD);  Surgeon: Danie Binder, MD;  Location: [facility] ENDO SUITE;  Service: Endoscopy;  Laterality: N/A;  . FINGER SURGERY Left    2nd, 3rd, & 4th fingers were cut off by table saw and reattached  . GASTRECTOMY    . HERNIA REPAIR    . INCISIONAL HERNIA REPAIR N/A 01/20/2014   Procedure: LAPAROSCOPIC RECURRENT  INCISIONAL HERNIA with mesh;  Surgeon: Edward Jolly, MD;  Location: [facility] ORS;  Service: General;  Laterality: N/A;  . rt knee arthroscopic surgery    . SHOULDER SURGERY Bilateral    3 surgeries on on left, 2 surgeries on right        Home Medications    Prior to Admission medications   Medication Sig Start Date End Date Taking? Authorizing Provider  acetaminophen (TYLENOL) 500 MG tablet Take 1,000 mg by mouth every 6 (six)  hours as needed for mild pain, moderate pain, fever or headache.   Yes [provider]  albuterol (PROVENTIL HFA;VENTOLIN HFA) 108 (90 Base) MCG/ACT inhaler Inhale 1-2 puffs into the lungs every 4 (four) hours as needed for wheezing. 08/29/16  Yes Amao, Charlane Ferretti, MD  FLOVENT HFA 110 MCG/ACT inhaler Take 1 puff by mouth 2 (two) times daily. 07/16/17  Yes [provider]  meloxicam (MOBIC) 7.5 MG tablet Take 1 tablet (7.5 mg total) by  mouth daily. 07/16/17  Yes Arnoldo Morale, MD  metoprolol (LOPRESSOR) 50 MG tablet Take 1 tablet (50 mg total) by mouth 2 (two) times daily. 09/03/16  Yes Arnoldo Morale, MD  Multiple Vitamin (MULTIVITAMIN WITH MINERALS) TABS tablet Take 1 tablet by mouth daily. 07/02/17  Yes Barton Dubois, MD  traZODone (DESYREL) 100 MG tablet Take 1 tablet (100 mg total) by mouth at bedtime. 07/01/17  Yes Barton Dubois, MD  docusate sodium (COLACE) 100 MG capsule Take 1 capsule (100 mg total) by mouth 2 (two) times daily. Patient not taking: Reported on 07/16/2017 07/01/17   Barton Dubois, MD  feeding supplement, ENSURE ENLIVE, (ENSURE ENLIVE) LIQD Take 237 mLs by mouth 2 (two) times daily between meals. Patient not taking: Reported on 07/16/2017 07/02/17   Barton Dubois, MD  nicotine (NICODERM CQ - DOSED IN MG/24 HOURS) 14 mg/24hr patch Place 1 patch (14 mg total) onto the skin daily. Patient not taking: Reported on 07/16/2017 07/02/17   Barton Dubois, MD  predniSONE (DELTASONE) 10 MG tablet Take 3 tablets by mouth daily X 2 days; then 2 tablets by mouth daily X 2 days; then 1 tablet by mouth for 2 days and stop prednisone. Patient not taking: Reported on 07/16/2017 07/01/17   Barton Dubois, MD  tiotropium (SPIRIVA HANDIHALER) 18 MCG inhalation capsule Place 1 capsule (18 mcg total) into inhaler and inhale daily. Patient not taking: Reported on 07/16/2017 07/01/17 07/01/18  Barton Dubois, MD    Family History Family History  Problem Relation Age of Onset  . Cancer Father        bone  . Cancer Brother        lungs  . Stroke Maternal Grandmother   . Asthma Son        died at age 32 in his sleep   . Spina bifida Son        died at age 95   . Dementia Mother   . Colon cancer Neg Hx     Social History Social History   Tobacco Use  . Smoking status: Current Every Day Smoker    Packs/day: 2.00    Years: 52.00    Pack years: 104.00    Types: Cigarettes    Start date: 07/30/1966  . Smokeless tobacco:  Never Used  . Tobacco comment: down to 1/4 ppd  Substance Use Topics  . Alcohol use: Yes    Alcohol/week: 1.8 - 2.4 oz    Types: 3 - 4 Cans of beer per week    Comment: everyday- 3-4 40 oz  . Drug use: No    Comment: denied using any drugs     Allergies   Bee venom and Penicillins   Review of Systems Review of Systems  Constitutional: Positive for fever.  HENT: Negative for sore throat.   Eyes: Negative for redness.  Respiratory: Positive for cough, shortness of breath and wheezing.   Cardiovascular: Negative for chest pain and leg swelling.  Gastrointestinal: Negative for abdominal pain, diarrhea and vomiting.  Genitourinary: Negative  for flank pain.  Musculoskeletal: Negative for back pain, neck pain and neck stiffness.  Skin: Negative for rash.  Neurological: Negative for headaches.  Hematological: Does not bruise/bleed easily.  Psychiatric/Behavioral: Negative for confusion.     Physical Exam Updated Vital Signs BP 109/75 (BP Location: Right Arm)   Pulse 79   Temp 98.1 F (36.7 C) (Oral)   Resp 18   SpO2 98%   Physical Exam  Constitutional: He appears well-developed and well-nourished. No distress.  HENT:  Mouth/Throat: Oropharynx is clear and moist.  Eyes: Conjunctivae are normal.  Neck: Neck supple. No JVD present. No tracheal deviation present.  Cardiovascular: Normal rate, regular rhythm, normal heart sounds and intact distal pulses. Exam reveals no gallop and no friction rub.  No murmur heard. Pulmonary/Chest: Effort normal. No accessory muscle usage. No respiratory distress. He has wheezes.  Abdominal: Soft. Bowel sounds are normal. He exhibits no distension. There is no tenderness.  Musculoskeletal: He exhibits no edema or tenderness.  Neurological: He is alert.  Skin: Skin is warm and dry. No rash noted. He is not diaphoretic.  Psychiatric: He has a normal mood and affect.  Nursing note and vitals reviewed.    ED Treatments / Results   Labs (all labs ordered are listed, but only abnormal results are displayed) Results for orders placed or performed during the hospital encounter of 07/26/17  CBC  Result Value Ref Range   WBC 9.0 4.0 - 10.5 K/uL   RBC 4.25 4.22 - 5.81 MIL/uL   Hemoglobin 10.6 (L) 13.0 - 17.0 g/dL   HCT 33.2 (L) 39.0 - 52.0 %   MCV 78.1 78.0 - 100.0 fL   MCH 24.9 (L) 26.0 - 34.0 pg   MCHC 31.9 30.0 - 36.0 g/dL   RDW 17.3 (H) 11.5 - 15.5 %   Platelets 324 150 - 400 K/uL  Basic metabolic panel  Result Value Ref Range   Sodium 138 135 - 145 mmol/L   Potassium 3.8 3.5 - 5.1 mmol/L   Chloride 108 101 - 111 mmol/L   CO2 25 22 - 32 mmol/L   Glucose, Bld 92 65 - 99 mg/dL   BUN 13 6 - 20 mg/dL   Creatinine, Ser 0.82 0.61 - 1.24 mg/dL   Calcium 8.5 (L) 8.9 - 10.3 mg/dL   GFR calc non Af Amer >60 >60 mL/min   GFR calc Af Amer >60 >60 mL/min   Anion gap 5 5 - 15   Dg Chest 2 View  Result Date: 07/26/2017 CLINICAL DATA:  SOB x 1-2 months; hx COPD, CAD, HTN; smoker x 2 ppd 50 years EXAM: CHEST - 2 VIEW COMPARISON:  07/11/2017 and previous the aortic calcifications. FINDINGS: Lungs are clear. Heart size and mediastinal contours are within normal limits. Aortic calcifications. No effusion. Postop change in the lateral left clavicle. Old right seventh rib fracture deformity. Surgical clips near the GE junction. IMPRESSION: No acute cardiopulmonary disease. Aortic Atherosclerosis (ICD10-170.0) Electronically Signed   By: Lucrezia Europe M.D.   On: 07/26/2017 08:48   Dg Chest 2 View  Result Date: 07/11/2017 CLINICAL DATA:  Initial evaluation for acute shortness of breath. History of asthma, COPD. EXAM: CHEST  2 VIEW COMPARISON:  Prior radiograph from 06/24/2017. FINDINGS: The cardiac and mediastinal silhouettes are stable in size and contour, and remain within normal limits. Aortic atherosclerosis. The lungs are normally inflated. Mild scattered chronic peribronchial thickening. No airspace consolidation, pleural  effusion, or pulmonary edema is identified. There is no pneumothorax. No  acute osseous abnormality identified. Remotely healed right-sided rib fracture noted. Surgical clips overlie the GE junction. IMPRESSION: 1. Chronic bronchitic changes. No other active cardiopulmonary disease. 2. Aortic atherosclerosis. Electronically Signed   By: Jeannine Boga M.D.   On: 07/11/2017 22:34   Ct Chest High Resolution  Result Date: 07/01/2017 CLINICAL DATA:  Emphysema, shortness of breath, hemoptysis, lung nodule. EXAM: CT CHEST WITHOUT CONTRAST TECHNIQUE: Multidetector CT imaging of the chest was performed following the standard protocol without intravenous contrast. High resolution imaging of the lungs, as well as inspiratory and expiratory imaging, was performed. COMPARISON:  08/21/2016. FINDINGS: Cardiovascular: Atherosclerotic calcification of the arterial vasculature, including moderate involvement of the coronary arteries. Heart size normal. No pericardial effusion. Mediastinum/Nodes: Mediastinal lymph nodes are not enlarged by CT size criteria. Hilar regions are difficult to evaluate without IV contrast. There may be mild distal esophageal wall thickening. Surgical clips in the region of the gastroesophageal junction. Lungs/Pleura: Centrilobular emphysema. When compared with 08/21/2016, there has been overall improvement in diffuse peribronchovascular nodularity with probable residual scarring. An area of irregular nodular consolidation in the apical segment right upper lobe measures roughly 9 x 9 mm (series 10, image 20) and is new from 08/21/2016. Vague subpleural ground-glass in the posterior left upper lobe (image 44), new and nonspecific. Mild subpleural volume loss in the dependent left lower lobe, likely atelectasis. No subpleural reticulation, traction bronchiectasis/bronchiolectasis, ground-glass, architectural show honeycombing. No pleural fluid. Airway is unremarkable. No air trapping. Upper Abdomen:  Visualized portions of the liver, gallbladder and right adrenal gland are unremarkable. Fluid density left adrenal nodule measures 11 mm. Visualized portions of the kidneys, spleen, pancreas, stomach and bowel are otherwise grossly unremarkable. No upper abdominal adenopathy. Musculoskeletal: Degenerative changes in the spine. No worrisome lytic or sclerotic lesions. There may be mild compression of the T1 vertebral body superior endplate, unchanged. IMPRESSION: 1. Overall improvement in peribronchovascular nodularity with probable areas of residual scarring. New irregular area of nodular consolidation in the apical segment right upper lobe. Consider repeat chest CT in 3 months as malignancy cannot be definitively excluded. This recommendation follows the consensus statement: Guidelines for Management of Incidental Pulmonary Nodules Detected on CT Images: From the Fleischner Society 2017; Radiology 2017; 284:228-243. 2. Aortic atherosclerosis (ICD10-170.0). Coronary artery calcification. 3.  Emphysema (ICD10-J43.9). 4. Left adrenal adenoma. Electronically Signed   By: Lorin Picket M.D.   On: 07/01/2017 08:07    EKG  EKG Interpretation  Date/Time:  Friday July 26 2017 08:24:53 EST Ventricular Rate:  76 PR Interval:    QRS Duration: 98 QT Interval:  397 QTC Calculation: 447 R Axis:   79 Text Interpretation:  Sinus rhythm Non-specific ST-t changes Confirmed by Lajean Saver [phone]) on 07/26/2017 1:13:10 PM       Radiology Dg Chest 2 View  Result Date: 07/26/2017 CLINICAL DATA:  SOB x 1-2 months; hx COPD, CAD, HTN; smoker x 2 ppd 50 years EXAM: CHEST - 2 VIEW COMPARISON:  07/11/2017 and previous the aortic calcifications. FINDINGS: Lungs are clear. Heart size and mediastinal contours are within normal limits. Aortic calcifications. No effusion. Postop change in the lateral left clavicle. Old right seventh rib fracture deformity. Surgical clips near the GE junction. IMPRESSION: No acute  cardiopulmonary disease. Aortic Atherosclerosis (ICD10-170.0) Electronically Signed   By: Lucrezia Europe M.D.   On: 07/26/2017 08:48    Procedures Procedures (including critical care time)  Medications Ordered in ED Medications  albuterol (PROVENTIL) (2.5 MG/3ML) 0.083% nebulizer solution 5 mg (not administered)  albuterol (PROVENTIL) (2.5 MG/3ML) 0.083% nebulizer solution 5 mg (5 mg Nebulization Given 07/26/17 1205)  ipratropium (ATROVENT) nebulizer solution 0.5 mg (0.5 mg Nebulization Given 07/26/17 1205)  predniSONE (DELTASONE) tablet 60 mg (60 mg Oral Given 07/26/17 1205)     Initial Impression / Assessment and Plan / ED Course  I have reviewed the triage vital signs and the nursing notes.  Pertinent labs & imaging results that were available during my care of the patient were reviewed by me and considered in my medical decision making (see chart for details).  Albuterol and atrovent neb.  Persistent wheezing.   Albuterol and atrovent neb. Prednisone.  No pna on cxr, however productive cough, greenish phlegm, reports fever - will also cover for suspected resp infection.  Few rhonchi on left. Wheezing improved w nebs. Improved air exchange.  Will cover w abx. Rocephin iv. zithromax iv.  On recheck breathing comfortably.  Pt currently appears stable for d/c.     Final Clinical Impressions(s) / ED Diagnoses   Final diagnoses:  None    ED Discharge Orders    None       Lajean Saver, MD 07/26/17 1501

## 2017-07-26 NOTE — Discharge Instructions (Signed)
It was our pleasure to provide your ER care today - we hope that you feel better.  Take prednisone as prescribed. Take antibiotic (zithromax) as prescribed.   Use albuterol treatment every 3-4 hours as need. Avoid any smoking.  Follow up with primary care doctor in  the next 2-3 days for recheck if symptoms fail to improve/resolve.  Return to ER if worse, new symptoms, chest pain, increased trouble breathing, other concern.

## 2017-07-26 NOTE — ED Notes (Signed)
Pt given [place] sandwich, cheese, saline crackers, and ice water.

## 2017-07-26 NOTE — ED Notes (Signed)
Bed: [facility] Expected date:  Expected time:  Means of arrival:  Comments: 

## 2017-07-26 NOTE — ED Triage Notes (Signed)
Pt c/o chronic shortness of breath r/t copd. Pt states his SOB has been worsening over the past several days and patient has been using inhalers. Pt arrived in no distress, expiratory wheezes. Pt with nonproductive cough. Sats 98% RA.

## 2017-07-26 NOTE — ED Notes (Signed)
Bed: [facility]EMS01 Expected date:  Expected time:  Means of arrival:  Comments: 61 yo m seizure?

## 2017-07-29 ENCOUNTER — Telehealth: Payer: Self-pay

## 2017-07-29 ENCOUNTER — Encounter: Payer: Self-pay | Admitting: Pediatric Intensive Care

## 2017-07-29 ENCOUNTER — Encounter (HOSPITAL_COMMUNITY): Payer: Self-pay

## 2017-07-29 ENCOUNTER — Emergency Department (HOSPITAL_COMMUNITY): Payer: Medicaid Other

## 2017-07-29 ENCOUNTER — Observation Stay (HOSPITAL_COMMUNITY)
Admission: EM | Admit: 2017-07-29 | Discharge: 2017-07-30 | Disposition: A | Discharge status: Home / Self Care | Payer: Medicaid Other | Attending: Family Medicine | Admitting: Family Medicine

## 2017-07-29 ENCOUNTER — Other Ambulatory Visit: Payer: Self-pay

## 2017-07-29 DIAGNOSIS — Z880 Allergy status to penicillin: Secondary | ICD-10-CM | POA: Diagnosis not present

## 2017-07-29 DIAGNOSIS — R197 Diarrhea, unspecified: Secondary | ICD-10-CM

## 2017-07-29 DIAGNOSIS — J440 Chronic obstructive pulmonary disease with acute lower respiratory infection: Secondary | ICD-10-CM

## 2017-07-29 DIAGNOSIS — Z5900 Homelessness unspecified: Secondary | ICD-10-CM

## 2017-07-29 DIAGNOSIS — R079 Chest pain, unspecified: Secondary | ICD-10-CM | POA: Diagnosis present

## 2017-07-29 DIAGNOSIS — J441 Chronic obstructive pulmonary disease with (acute) exacerbation: Secondary | ICD-10-CM | POA: Diagnosis not present

## 2017-07-29 DIAGNOSIS — J209 Acute bronchitis, unspecified: Secondary | ICD-10-CM | POA: Diagnosis not present

## 2017-07-29 DIAGNOSIS — Z91030 Bee allergy status: Secondary | ICD-10-CM | POA: Insufficient documentation

## 2017-07-29 DIAGNOSIS — Z720 Tobacco use: Secondary | ICD-10-CM | POA: Diagnosis not present

## 2017-07-29 DIAGNOSIS — R112 Nausea with vomiting, unspecified: Secondary | ICD-10-CM | POA: Diagnosis not present

## 2017-07-29 DIAGNOSIS — J9601 Acute respiratory failure with hypoxia: Secondary | ICD-10-CM | POA: Diagnosis not present

## 2017-07-29 DIAGNOSIS — R071 Chest pain on breathing: Secondary | ICD-10-CM | POA: Diagnosis not present

## 2017-07-29 DIAGNOSIS — R0781 Pleurodynia: Secondary | ICD-10-CM | POA: Insufficient documentation

## 2017-07-29 DIAGNOSIS — I10 Essential (primary) hypertension: Secondary | ICD-10-CM | POA: Diagnosis not present

## 2017-07-29 DIAGNOSIS — F329 Major depressive disorder, single episode, unspecified: Secondary | ICD-10-CM | POA: Diagnosis not present

## 2017-07-29 DIAGNOSIS — R0602 Shortness of breath: Secondary | ICD-10-CM | POA: Diagnosis present

## 2017-07-29 LAB — CBC WITH DIFFERENTIAL/PLATELET
BASOS ABS: 0 10*3/uL (ref 0.0–0.1)
Basophils Relative: 0 %
EOS PCT: 0 %
Eosinophils Absolute: 0 10*3/uL (ref 0.0–0.7)
HCT: 36.1 % — ABNORMAL LOW (ref 39.0–52.0)
Hemoglobin: 11.2 g/dL — ABNORMAL LOW (ref 13.0–17.0)
LYMPHS ABS: 2.5 10*3/uL (ref 0.7–4.0)
LYMPHS PCT: 36 %
MCH: 24.3 pg — AB (ref 26.0–34.0)
MCHC: 31 g/dL (ref 30.0–36.0)
MCV: 78.5 fL (ref 78.0–100.0)
MONO ABS: 0.5 10*3/uL (ref 0.1–1.0)
Monocytes Relative: 8 %
Neutro Abs: 4 10*3/uL (ref 1.7–7.7)
Neutrophils Relative %: 56 %
PLATELETS: 364 10*3/uL (ref 150–400)
RBC: 4.6 MIL/uL (ref 4.22–5.81)
RDW: 16.9 % — AB (ref 11.5–15.5)
WBC: 7.1 10*3/uL (ref 4.0–10.5)

## 2017-07-29 LAB — COMPREHENSIVE METABOLIC PANEL
ALT: 35 U/L (ref 17–63)
ANION GAP: 8 (ref 5–15)
AST: 56 U/L — ABNORMAL HIGH (ref 15–41)
Albumin: 3.5 g/dL (ref 3.5–5.0)
Alkaline Phosphatase: 66 U/L (ref 38–126)
BUN: 8 mg/dL (ref 6–20)
CHLORIDE: 107 mmol/L (ref 101–111)
CO2: 24 mmol/L (ref 22–32)
Calcium: 8.4 mg/dL — ABNORMAL LOW (ref 8.9–10.3)
Creatinine, Ser: 0.73 mg/dL (ref 0.61–1.24)
Glucose, Bld: 84 mg/dL (ref 65–99)
POTASSIUM: 3.7 mmol/L (ref 3.5–5.1)
Sodium: 139 mmol/L (ref 135–145)
Total Bilirubin: 0.3 mg/dL (ref 0.3–1.2)
Total Protein: 7 g/dL (ref 6.5–8.1)

## 2017-07-29 MED ORDER — DEXTROSE 5 % IV SOLN
500.0000 mg | Freq: Every day | INTRAVENOUS | Status: DC
  Administered 2017-07-30: 500 mg via INTRAVENOUS
  Filled 2017-07-29 (×2): qty 500

## 2017-07-29 MED ORDER — ENSURE ENLIVE PO LIQD
237.0000 mL | Freq: Two times a day (BID) | ORAL | Status: DC
  Administered 2017-07-30: 237 mL via ORAL

## 2017-07-29 MED ORDER — ACETAMINOPHEN 325 MG PO TABS
650.0000 mg | ORAL_TABLET | Freq: Four times a day (QID) | ORAL | Status: DC | PRN
  Administered 2017-07-30 (×2): 650 mg via ORAL
  Filled 2017-07-29 (×2): qty 2

## 2017-07-29 MED ORDER — ACETAMINOPHEN 650 MG RE SUPP: 650.0000 mg | Freq: Four times a day (QID) | RECTAL | Status: DC | PRN

## 2017-07-29 MED ORDER — TRAZODONE HCL 100 MG PO TABS
100.0000 mg | ORAL_TABLET | Freq: Every evening | ORAL | Status: DC | PRN
Start: 2017-07-30 — End: 2017-07-30

## 2017-07-29 MED ORDER — ALBUTEROL SULFATE (2.5 MG/3ML) 0.083% IN NEBU
2.5000 mg | INHALATION_SOLUTION | Freq: Once | RESPIRATORY_TRACT | Status: AC
  Administered 2017-07-29: 2.5 mg via RESPIRATORY_TRACT
  Filled 2017-07-29: qty 3

## 2017-07-29 MED ORDER — METHYLPREDNISOLONE SODIUM SUCC 125 MG IJ SOLR
60.0000 mg | Freq: Two times a day (BID) | INTRAMUSCULAR | Status: DC
  Administered 2017-07-30 (×2): 60 mg via INTRAVENOUS
  Filled 2017-07-29 (×2): qty 2

## 2017-07-29 MED ORDER — ALBUTEROL SULFATE (2.5 MG/3ML) 0.083% IN NEBU: 5.0000 mg | INHALATION_SOLUTION | Freq: Once | RESPIRATORY_TRACT | Status: DC

## 2017-07-29 MED ORDER — ADULT MULTIVITAMIN W/MINERALS CH
1.0000 | ORAL_TABLET | Freq: Every day | ORAL | Status: DC
  Administered 2017-07-30: 1 via ORAL
  Filled 2017-07-29: qty 1

## 2017-07-29 MED ORDER — IPRATROPIUM-ALBUTEROL 0.5-2.5 (3) MG/3ML IN SOLN
3.0000 mL | Freq: Once | RESPIRATORY_TRACT | Status: AC
  Administered 2017-07-29: 3 mL via RESPIRATORY_TRACT
  Filled 2017-07-29: qty 3

## 2017-07-29 MED ORDER — ONDANSETRON HCL 4 MG PO TABS: 4.0000 mg | ORAL_TABLET | Freq: Four times a day (QID) | ORAL | Status: DC | PRN

## 2017-07-29 MED ORDER — IPRATROPIUM-ALBUTEROL 0.5-2.5 (3) MG/3ML IN SOLN
3.0000 mL | Freq: Four times a day (QID) | RESPIRATORY_TRACT | Status: DC
  Administered 2017-07-29 – 2017-07-30 (×3): 3 mL via RESPIRATORY_TRACT
  Filled 2017-07-29 (×3): qty 3

## 2017-07-29 MED ORDER — METOPROLOL TARTRATE 50 MG PO TABS
50.0000 mg | ORAL_TABLET | Freq: Two times a day (BID) | ORAL | Status: DC
  Administered 2017-07-30 (×2): 50 mg via ORAL
  Filled 2017-07-29 (×2): qty 1

## 2017-07-29 MED ORDER — ENOXAPARIN SODIUM 40 MG/0.4ML ~~LOC~~ SOLN
40.0000 mg | SUBCUTANEOUS | Status: DC
  Administered 2017-07-30: 40 mg via SUBCUTANEOUS
  Filled 2017-07-29: qty 0.4

## 2017-07-29 MED ORDER — ACETAMINOPHEN 500 MG PO TABS: 1000.0000 mg | ORAL_TABLET | Freq: Four times a day (QID) | ORAL | Status: DC | PRN

## 2017-07-29 MED ORDER — ONDANSETRON HCL 4 MG/2ML IJ SOLN: 4.0000 mg | Freq: Four times a day (QID) | INTRAMUSCULAR | Status: DC | PRN

## 2017-07-29 MED ORDER — POTASSIUM CHLORIDE IN NACL 20-0.9 MEQ/L-% IV SOLN
INTRAVENOUS | Status: DC
  Administered 2017-07-30: 75 mL/h via INTRAVENOUS
  Filled 2017-07-29 (×2): qty 1000

## 2017-07-29 MED ORDER — ALBUTEROL SULFATE (2.5 MG/3ML) 0.083% IN NEBU: 2.5000 mg | INHALATION_SOLUTION | RESPIRATORY_TRACT | Status: DC | PRN

## 2017-07-29 MED FILL — AZITHROMYCIN 250 MG TABLET: 250 | 5 days supply | Qty: 6 | Fill #0

## 2017-07-29 MED FILL — predniSONE 20 MG TABS: 20 | 4 days supply | Qty: 12 | Fill #0

## 2017-07-29 MED FILL — PROVENTIL HFA 108 (90 BASE): 108 (90 BAS | 17 days supply | Qty: 7 | Fill #0

## 2017-07-29 NOTE — Congregational Nurse Program (Signed)
Congregational Nurse Program Note  Date of Encounter: 07/29/2017  Past Medical History: Past Medical History:  Diagnosis Date  . Alcoholism ([place])   . CAD (coronary artery disease) 2002   MI, no intervention required  . COPD (chronic obstructive pulmonary disease) (HCC)    not on home O2  . Depression   . GERD (gastroesophageal reflux disease)   . Headache   . Hypertension   . Stroke [facility]) 2006    Encounter Details: CNP Questionnaire - 07/29/17 1045      Questionnaire   Patient Status  Not Applicable    Race  White or Caucasian    Location Patient [place]  Medicaid    Uninsured  Not Applicable    Food  Yes, have food insecurities    Housing/Utilities  No permanent housing    Transportation  Yes, need transportation assistance    Interpersonal Safety  No, do not feel physically and emotionally safe where you currently live    Medication  Yes, have medication insecurities    Medical Provider  Yes    Referrals  [place] Provider/Clinic;Medication Assistance    ED Visit Averted  Not Applicable    Life-Saving Intervention Made  Not Applicable      Clinical Intake - 07/16/17 1418      Pre-visit preparation   Pre-visit preparation completed  Yes      Pain   Pain   0-10    Pain Score  8     Pain Location  Head      Nutrition Screen   Diabetes  No      Functional Status   Activities of Daily Living  Independent    Ambulation  Independent    Medication Administration  Independent    Home Management  Independent      Abuse/Neglect   Do you feel unsafe in your current relationship?  No    Do you feel physically threatened by others?  No    Anyone hurting you at home, work, or school?  No    Unable to ask?  No      Investment banker, operational Needed?  No     Client states that he had ED visit on 12/28 for COPD/pneumonia. He has prescriptions for azithromycin, prednisone and albuterol inhaler. CN called  J Brazeau CM at [facility] regarding upcoming appointment and checking in at [facility] for [facility] medication management. Client agrees to go to [place] on Wednesday. CN provided bus passes.Client also agrees to wait for CN to deliver medications this afternoon.

## 2017-07-29 NOTE — ED Notes (Signed)
Bed: NG39 Expected date:  Expected time:  Means of arrival:  Comments: Hold for triage 4

## 2017-07-29 NOTE — H&P (Signed)
[facility] History and Physical  River Ambrosio QQV:[id number] DOB: [birth year] DOA: 07/29/2017  Referring physician: Dr Roderic Palau PCP: Arnoldo Morale, MD   Chief Complaint: SOB  HPI: Samuel Reyes is a 61 y.o. male w/ hx of etoh abuse, tobacco use, COPD and depression comes to ED c/o chest pain and SOB, cough w/ green sputum and blood-tinged, ^'d sputum production x 3-4 days. Chest pain is pleuritic and subxiphoid. Got nebs in ED but walking SpO2 was in mid 80's. Asked to see for admission.   Pt denies abd pain, +diarrhea, some nausea.  +sweats , no fever or chills.  Pt lives at the shelter on [facility].  No hx heart disease. Was heavy drinker but cut back due to stomach pains and now is occ drinker  Chart: -jan 2018 > acute resp failure d/t COPD flare, etoh abuse/ CIWA protocol, N/V, etc -jan 20-18 > HCAP, acute resp failure, COPD, bronchitis, pulm nodules by CT, micro anemia, prob cirrhosis of liver per imaging , hx etoh abuse -nov 26- Jul 01, 2017 > acute resp failure/COPD exac, homelessnes, etoh abuse, r/o for TB, tobacco use, HTN on bb, lives in shelter, deconditioning  ROS  no joint pain   no HA  no blurry vision  no rash  no dysuria  no difficulty voiding  no change in urine color    Past Medical History  Past Medical History:  Diagnosis Date  . Alcoholism ([place])   . CAD (coronary artery disease) 2002   MI, no intervention required  . COPD (chronic obstructive pulmonary disease) (HCC)    not on home O2  . Depression   . GERD (gastroesophageal reflux disease)   . Headache   . Hypertension   . Stroke [facility]) 2006   Past Surgical History  Past Surgical History:  Procedure Laterality Date  . BACK SURGERY     3 cervical spine surgeries C4-C5 fused  . COLONOSCOPY N/A 01/04/2014   Procedure: COLONOSCOPY;  Surgeon: Danie Binder, MD;  [facility];  Service: Endoscopy;  Laterality: N/A;  1:45  . ESOPHAGOGASTRODUODENOSCOPY N/A 01/04/2014   Procedure:  ESOPHAGOGASTRODUODENOSCOPY (EGD);  Surgeon: Danie Binder, MD;  Location: [facility] ENDO SU[facility];  Service: Endoscopy;  Laterality: N/A;  . FINGER SURGERY Left    2nd, 3rd, & 4th fingers were cut off by table saw and reattached  . GASTRECTOMY    . HERNIA REPAIR    . INCISIONAL HERNIA REPAIR N/A 01/20/2014   Procedure: LAPAROSCOPIC RECURRENT  INCISIONAL HERNIA with mesh;  Surgeon: Edward Jolly, MD;  Location: [facility] ORS;  Service: General;  Laterality: N/A;  . rt knee arthroscopic surgery    . SHOULDER SURGERY Bilateral    3 surgeries on on left, 2 surgeries on right    Family History  Family History  Problem Relation Age of Onset  . Cancer Father        bone  . Cancer Brother        lungs  . Stroke Maternal Grandmother   . Asthma Son        died at age 65 in his sleep   . Spina bifida Son        died at age 30   . Dementia Mother   . Colon cancer Neg Hx    Social History  reports that he has been smoking cigarettes.  He started smoking about 51 years ago. He has a 104.00 pack-year smoking history. he has never used smokeless tobacco. He  reports that he drinks about 1.8 - 2.4 oz of alcohol per week. He reports that he does not use drugs. Allergies  Allergies  Allergen Reactions  . Bee Venom Anaphylaxis  . Penicillins Rash    Has patient had a PCN reaction causing immediate rash, facial/tongue/throat swelling, SOB or lightheadedness with hypotension: {Yes Has patient had a PCN reaction causing severe rash involving mucus membranes or skin necrosis: YES Has patient had a PCN reaction that required hospitalization Yes Has patient had a PCN reaction occurring within the last 10 years: YES If all of the above answers are "NO", then may proceed with Cephalosporin use.    Home medications Prior to Admission medications   Medication Sig Start Date End Date Taking? Authorizing Provider  acetaminophen (TYLENOL) 500 MG tablet Take 1,000 mg by mouth every 6 (six) hours as needed for mild pain,  moderate pain, fever or headache.    [provider]  albuterol (PROVENTIL HFA;VENTOLIN HFA) 108 (90 Base) MCG/ACT inhaler Inhale 1-2 puffs into the lungs every 4 (four) hours as needed for wheezing. 08/29/16   Arnoldo Morale, MD  albuterol (PROVENTIL HFA;VENTOLIN HFA) 108 (90 Base) MCG/ACT inhaler Inhale 2 puffs into the lungs every 4 (four) hours as needed for wheezing or shortness of breath. 07/26/17   Lajean Saver, MD  azithromycin (ZITHROMAX Z-PAK) 250 MG tablet Take two tablets on Saturday, 12/29, then take one tablet a day for the next 4 days. 07/26/17   Lajean Saver, MD  docusate sodium (COLACE) 100 MG capsule Take 1 capsule (100 mg total) by mouth 2 (two) times daily. Patient not taking: Reported on 07/16/2017 07/01/17   Barton Dubois, MD  feeding supplement, ENSURE ENLIVE, (ENSURE ENLIVE) LIQD Take 237 mLs by mouth 2 (two) times daily between meals. Patient not taking: Reported on 07/16/2017 07/02/17   Barton Dubois, MD  FLOVENT HFA 110 MCG/ACT inhaler Take 1 puff by mouth 2 (two) times daily. 07/16/17   [provider]  meloxicam (MOBIC) 7.5 MG tablet Take 1 tablet (7.5 mg total) by mouth daily. 07/16/17   Arnoldo Morale, MD  metoprolol (LOPRESSOR) 50 MG tablet Take 1 tablet (50 mg total) by mouth 2 (two) times daily. 09/03/16   Arnoldo Morale, MD  Multiple Vitamin (MULTIVITAMIN WITH MINERALS) TABS tablet Take 1 tablet by mouth daily. 07/02/17   Barton Dubois, MD  nicotine (NICODERM CQ - DOSED IN MG/24 HOURS) 14 mg/24hr patch Place 1 patch (14 mg total) onto the skin daily. Patient not taking: Reported on 07/16/2017 07/02/17   Barton Dubois, MD  predniSONE (DELTASONE) 10 MG tablet Take 3 tablets by mouth daily X 2 days; then 2 tablets by mouth daily X 2 days; then 1 tablet by mouth for 2 days and stop prednisone. Patient not taking: Reported on 07/16/2017 07/01/17   Barton Dubois, MD  predniSONE (DELTASONE) 20 MG tablet Take 3 tablets (60 mg total) by mouth daily. 07/26/17    Lajean Saver, MD  tiotropium (SPIRIVA HANDIHALER) 18 MCG inhalation capsule Place 1 capsule (18 mcg total) into inhaler and inhale daily. Patient not taking: Reported on 07/16/2017 07/01/17 07/01/18  Barton Dubois, MD  traZODone (DESYREL) 100 MG tablet Take 1 tablet (100 mg total) by mouth at bedtime. 07/01/17   Barton Dubois, MD   Liver Function Tests Recent Labs  Lab 07/29/17 1520  AST 56*  ALT 35  ALKPHOS 66  BILITOT 0.3  PROT 7.0  ALBUMIN 3.5   No results for input(s): LIPASE, AMYLASE in the last 168  hours. CBC Recent Labs  Lab 07/26/17 1205 07/29/17 1520  WBC 9.0 7.1  NEUTROABS  --  4.0  HGB 10.6* 11.2*  HCT 33.2* 36.1*  MCV 78.1 78.5  PLT 324 373   Basic Metabolic Panel Recent Labs  Lab 07/26/17 1205 07/29/17 1520  NA 138 139  K 3.8 3.7  CL 108 107  CO2 25 24  GLUCOSE 92 84  BUN 13 8  CREATININE 0.82 0.73  CALCIUM 8.5* 8.4*     Vitals:   07/29/17 1345 07/29/17 1347 07/29/17 1355 07/29/17 1611  BP:   136/76 114/72  Pulse:   70 81  Resp:   18 18  Temp:   (!) 97.5 F (36.4 C)   TempSrc:   Oral   SpO2: 90%   99%  Weight:  22.2 kg (49 lb)    Height:  5\' 4"  (1.626 m)     Exam: Gen alert, no distress, calm No rash, cyanosis or gangrene Sclera anicteric, throat clear  No jvd or bruits Chest bilat mild-mod exp wheezing, scattered rhonchi, no rales RRR no MRG Abd soft ntnd no mass or ascites +bs GU normal male MS no joint effusions or deformity Ext no LE edema, no wounds or ulcers Neuro is alert, Ox 3 , nf    Home meds: -albuterol HFA/ flovent HFA/ spiriva qd/ pred taper/ zithromax -trazodone 100 hs/ nicotine patch/ mobic 7.5 qd -lopressor 50 bid -colace/ ensure/ MVI   Na 139 K 3.7  BUN 8 Cr 0.73  WBC 7k  HB 11 plt 364  EKG (independ reviewed) > NSR , no acute changes CXR (independ reviewed) > COPD, no acute changes   Assessment: 1. SOB/ hypoxemia - due to acute/ chron resp failure from COPD exac and acute bronchitis. Plan admit,  IVF"s, IV solumedrol, IV azithromycin and nebs.  Pt stable.  2. COPD 3. Past hx etoh abuse  4. Tobacco 5. Homelessness 6. Depression 7.   Plan - as above       Di[place] D [facility] Pager [phone]   If 7PM-7AM, please contact night-coverage [email or web address] Password [facility] 07/29/2017, 6:52 PM

## 2017-07-29 NOTE — Telephone Encounter (Signed)
Call received from [facility], [place] who was with the patient.  Confirmed his appointment at [facility] on 08/06/17 @ 1000. [place] stated that she was coming to [facility] today before 1400 to pick up medications for the patient which include an inhaler and prednisone.  The patient stated that he went to [facility] on 07/17/17 and was told that they will not see walk ins. Explained to him that he can call [facility] in the morning and they will try to give him a same day appointment. He said that he will call on 07/31/17 as tomorrow is a holiday. [place] to provide him with bus passes to get to [facility].

## 2017-07-29 NOTE — ED Provider Notes (Signed)
[facility] DEPT Provider Note   CSN: [id number] Arrival date & time: 07/29/17  1331     History   Chief Complaint Chief Complaint  Patient presents with  . Shortness of Breath  . Diarrhea    HPI Samuel Reyes is a 61 y.o. male.  Patient complains of shortness of breath and wheezing.  Patient was seen 2 days ago with similar symptoms but has not started taking the prednisone that was prescribed for him   The history is provided by the patient. No language interpreter was used.  Shortness of Breath  This is a recurrent problem. The problem occurs frequently.The current episode started more than 2 days ago. The problem has not changed since onset.Associated symptoms include wheezing. Pertinent negatives include no fever, no headaches, no cough, no chest pain, no abdominal pain and no rash. It is unknown what precipitated the problem. Risk factors include smoking. He has tried ipratropium inhalers for the symptoms.  Diarrhea   Pertinent negatives include no abdominal pain, no headaches and no cough.    Past Medical History:  Diagnosis Date  . Alcoholism ([place])   . CAD (coronary artery disease) 2002   MI, no intervention required  . COPD (chronic obstructive pulmonary disease) (HCC)    not on home O2  . Depression   . GERD (gastroesophageal reflux disease)   . Headache   . Hypertension   . Stroke [facility]) 2006    Patient Active Problem List   Diagnosis Date Noted  . Alcohol use   . Homelessness 06/24/2017  . Lung nodule 09/03/2016  . Tobacco abuse 09/03/2016  . Insomnia 09/03/2016  . HCAP (healthcare-associated pneumonia) 08/22/2016  . Bronchiolitis 08/21/2016  . Malnutrition of moderate degree 08/04/2016  . Atrial tachycardia ([place]) 08/02/2016  . Impaired glucose tolerance 08/02/2016  . Alcohol-induced mood disorder ([place]) 12/12/2015  . Syncope 11/15/2015  . Chest pain 11/15/2015  . Nausea vomiting and diarrhea 11/15/2015  . Abdominal  pain 11/15/2015  . Major depressive disorder, recurrent episode, moderate with anxious distress ([place]) 10/30/2015  . Alcohol use disorder, severe, dependence ([place]) 10/29/2015  . COPD exacerbation ([place]) 09/20/2015  . Acute respiratory failure with hypoxia ([place]) 09/18/2015  . Suicidal ideation 09/17/2015  . Alcohol intoxication ([place]) 09/17/2015  . Depression 09/17/2015  . Benign essential HTN 09/17/2015  . Hypokalemia 09/17/2015  . Hyponatremia 09/17/2015  . Coffee ground emesis 09/17/2015  . COPD (chronic obstructive pulmonary disease) ([place]) 02/17/2013  . GERD (gastroesophageal reflux disease) 02/17/2013    Past Surgical History:  Procedure Laterality Date  . BACK SURGERY     3 cervical spine surgeries C4-C5 fused  . COLONOSCOPY N/A 01/04/2014   Procedure: COLONOSCOPY;  Surgeon: Danie Binder, MD;  Location[facility];  Service: Endoscopy;  Laterality: N/A;  1:45  . ESOPHAGOGASTRODUODENOSCOPY N/A 01/04/2014   Procedure: ESOPHAGOGASTRODUODENOSCOPY (EGD);  Surgeon: Danie Binder, MD;  Location: [facility] ENDO [facility];  Service: Endoscopy;  Laterality: N/A;  . FINGER SURGERY Left    2nd, 3rd, & 4th fingers were cut off by table saw and reattached  . GASTRECTOMY    . HERNIA REPAIR    . INCISIONAL HERNIA REPAIR N/A 01/20/2014   Procedure: LAPAROSCOPIC RECURRENT  INCISIONAL HERNIA with mesh;  Surgeon: Edward Jolly, MD;  Location: [facility] ORS;  Service: General;  Laterality: N/A;  . rt knee arthroscopic surgery    . SHOULDER SURGERY Bilateral    3 surgeries on on left, 2 surgeries on right  Home Medications    Prior to Admission medications   Medication Sig Start Date End Date Taking? Authorizing Provider  acetaminophen (TYLENOL) 500 MG tablet Take 1,000 mg by mouth every 6 (six) hours as needed for mild pain, moderate pain, fever or headache.    [provider]  albuterol (PROVENTIL HFA;VENTOLIN HFA) 108 (90 Base) MCG/ACT inhaler Inhale 1-2 puffs into the lungs every 4  (four) hours as needed for wheezing. 08/29/16   Arnoldo Morale, MD  albuterol (PROVENTIL HFA;VENTOLIN HFA) 108 (90 Base) MCG/ACT inhaler Inhale 2 puffs into the lungs every 4 (four) hours as needed for wheezing or shortness of breath. 07/26/17   Lajean Saver, MD  azithromycin (ZITHROMAX Z-PAK) 250 MG tablet Take two tablets on Saturday, 12/29, then take one tablet a day for the next 4 days. 07/26/17   Lajean Saver, MD  docusate sodium (COLACE) 100 MG capsule Take 1 capsule (100 mg total) by mouth 2 (two) times daily. Patient not taking: Reported on 07/16/2017 07/01/17   Barton Dubois, MD  feeding supplement, ENSURE ENLIVE, (ENSURE ENLIVE) LIQD Take 237 mLs by mouth 2 (two) times daily between meals. Patient not taking: Reported on 07/16/2017 07/02/17   Barton Dubois, MD  FLOVENT HFA 110 MCG/ACT inhaler Take 1 puff by mouth 2 (two) times daily. 07/16/17   [provider]  meloxicam (MOBIC) 7.5 MG tablet Take 1 tablet (7.5 mg total) by mouth daily. 07/16/17   Arnoldo Morale, MD  metoprolol (LOPRESSOR) 50 MG tablet Take 1 tablet (50 mg total) by mouth 2 (two) times daily. 09/03/16   Arnoldo Morale, MD  Multiple Vitamin (MULTIVITAMIN WITH MINERALS) TABS tablet Take 1 tablet by mouth daily. 07/02/17   Barton Dubois, MD  nicotine (NICODERM CQ - DOSED IN MG/24 HOURS) 14 mg/24hr patch Place 1 patch (14 mg total) onto the skin daily. Patient not taking: Reported on 07/16/2017 07/02/17   Barton Dubois, MD  predniSONE (DELTASONE) 10 MG tablet Take 3 tablets by mouth daily X 2 days; then 2 tablets by mouth daily X 2 days; then 1 tablet by mouth for 2 days and stop prednisone. Patient not taking: Reported on 07/16/2017 07/01/17   Barton Dubois, MD  predniSONE (DELTASONE) 20 MG tablet Take 3 tablets (60 mg total) by mouth daily. 07/26/17   Lajean Saver, MD  tiotropium (SPIRIVA HANDIHALER) 18 MCG inhalation capsule Place 1 capsule (18 mcg total) into inhaler and inhale daily. Patient not taking: Reported on  07/16/2017 07/01/17 07/01/18  Barton Dubois, MD  traZODone (DESYREL) 100 MG tablet Take 1 tablet (100 mg total) by mouth at bedtime. 07/01/17   Barton Dubois, MD    Family History Family History  Problem Relation Age of Onset  . Cancer Father        bone  . Cancer Brother        lungs  . Stroke Maternal Grandmother   . Asthma Son        died at age 57 in his sleep   . Spina bifida Son        died at age 26   . Dementia Mother   . Colon cancer Neg Hx     Social History Social History   Tobacco Use  . Smoking status: Current Every Day Smoker    Packs/day: 2.00    Years: 52.00    Pack years: 104.00    Types: Cigarettes    Start date: 07/30/1966  . Smokeless tobacco: Never Used  . Tobacco comment: down to 1/4  ppd  Substance Use Topics  . Alcohol use: Yes    Alcohol/week: 1.8 - 2.4 oz    Types: 3 - 4 Cans of beer per week    Comment: everyday- 3-4 40 oz  . Drug use: No    Comment: denied using any drugs     Allergies   Bee venom and Penicillins   Review of Systems Review of Systems  Constitutional: Negative for appetite change, fatigue and fever.  HENT: Negative for congestion, ear discharge and sinus pressure.   Eyes: Negative for discharge.  Respiratory: Positive for shortness of breath and wheezing. Negative for cough.   Cardiovascular: Negative for chest pain.  Gastrointestinal: Positive for diarrhea. Negative for abdominal pain.  Genitourinary: Negative for frequency and hematuria.  Musculoskeletal: Negative for back pain.  Skin: Negative for rash.  Neurological: Negative for seizures and headaches.  Psychiatric/Behavioral: Negative for hallucinations.     Physical Exam Updated Vital Signs BP 114/72 (BP Location: Left Arm)   Pulse 81   Temp (!) 97.5 F (36.4 C) (Oral)   Resp 18   Ht 5\' 4"  (1.626 m)   Wt 22.2 kg (49 lb)   SpO2 99%   BMI 8.41 kg/m   Physical Exam  Constitutional: He is oriented to person, place, and time. He appears  well-developed.  HENT:  Head: Normocephalic.  Eyes: Conjunctivae and EOM are normal. No scleral icterus.  Neck: Neck supple. No thyromegaly present.  Cardiovascular: Normal rate and regular rhythm. Exam reveals no gallop and no friction rub.  No murmur heard. Pulmonary/Chest: No stridor. He has wheezes. He has no rales. He exhibits no tenderness.  Abdominal: He exhibits no distension. There is no tenderness. There is no rebound.  Musculoskeletal: Normal range of motion. He exhibits no edema.  Lymphadenopathy:    He has no cervical adenopathy.  Neurological: He is oriented to person, place, and time. He exhibits normal muscle tone. Coordination normal.  Skin: No rash noted. No erythema.  Psychiatric: He has a normal mood and affect. His behavior is normal.     ED Treatments / Results  Labs (all labs ordered are listed, but only abnormal results are displayed) Labs Reviewed  CBC WITH DIFFERENTIAL/PLATELET - Abnormal; Notable for the following components:      Result Value   Hemoglobin 11.2 (*)    HCT 36.1 (*)    MCH 24.3 (*)    RDW 16.9 (*)    All other components within normal limits  COMPREHENSIVE METABOLIC PANEL - Abnormal; Notable for the following components:   Calcium 8.4 (*)    AST 56 (*)    All other components within normal limits    EKG  EKG Interpretation  Date/Time:  Monday July 29 2017 14:06:17 EST Ventricular Rate:  75 PR Interval:    QRS Duration: 99 QT Interval:  412 QTC Calculation: 461 R Axis:   71 Text Interpretation:  Sinus rhythm Abnormal R-wave progression, early transition Confirmed by Milton Ferguson [phone]) on 07/29/2017 5:46:21 PM       Radiology Dg Chest 2 View  Result Date: 07/29/2017 CLINICAL DATA:  Shortness of breath, wheezing EXAM: CHEST  2 VIEW COMPARISON:  07/26/2017 FINDINGS: Lungs are essentially clear.  No focal consolidation No pleural effusion or pneumothorax. The heart is normal in size. Mild degenerative changes the lower  thoracolumbar spine. Old right anterior rib fracture deformities. IMPRESSION: No evidence of acute cardiopulmonary disease. Electronically Signed   By: Julian Hy M.D.   On: 07/29/2017 15:10  Procedures Procedures (including critical care time)  Medications Ordered in ED Medications  ipratropium-albuterol (DUONEB) 0.5-2.5 (3) MG/3ML nebulizer solution 3 mL (not administered)  albuterol (PROVENTIL) (2.5 MG/3ML) 0.083% nebulizer solution 2.5 mg (not administered)  ipratropium-albuterol (DUONEB) 0.5-2.5 (3) MG/3ML nebulizer solution 3 mL (3 mLs Nebulization Given 07/29/17 1519)  albuterol (PROVENTIL) (2.5 MG/3ML) 0.083% nebulizer solution 2.5 mg (2.5 mg Nebulization Given 07/29/17 1519)  albuterol (PROVENTIL) (2.5 MG/3ML) 0.083% nebulizer solution 2.5 mg (2.5 mg Nebulization Given 07/29/17 1636)     Initial Impression / Assessment and Plan / ED Course  I have reviewed the triage vital signs and the nursing notes.  Pertinent labs & imaging results that were available during my care of the patient were reviewed by me and considered in my medical decision making (see chart for details).     Patient will be admitted for COPD exacerbation.  He was ambulated around the emergency department and his O2 sat never got above 89 but dropped down to 84-85%  Final Clinical Impressions(s) / ED Diagnoses   Final diagnoses:  COPD exacerbation [facility])    ED Discharge Orders    None       Milton Ferguson, MD 07/29/17 1840

## 2017-07-29 NOTE — ED Notes (Signed)
Ambulated pt with pulse ox and his O2 never went above 89%

## 2017-07-29 NOTE — Telephone Encounter (Signed)
Call received from [place], [place].  She reported that she is picking up the patient's medications the  Outpatient Pharmacy and the pharmacist has strongly advised against taking the trazodone with the azithromycin as it has a potential to cause cardiac arrhythmias.  She was requesting advice from PCP.   The above comment was  shared with Dr Jarold Song who noted that the patient can hold the trazodone while on azithromycin.  [place] stated that she would inform the patient of the recommendation from Dr Jarold Song.

## 2017-07-29 NOTE — ED Notes (Signed)
Pt is alert and oriented x 4 and is verbally responsive, Pt reports chest discomfort and HA. PT has bilateral inspiratory and expiratory wheezes. Pt is breathing regular and unlabored. Pt is O2 saturation is 98% RA.

## 2017-07-29 NOTE — Congregational Nurse Program (Signed)
Congregational Nurse Program Note  Date of Encounter: 07/29/2017  Past Medical History: Past Medical History:  Diagnosis Date  . Alcoholism ([employer])   . CAD (coronary artery disease) 2002   MI, no intervention required  . COPD (chronic obstructive pulmonary disease) (HCC)    not on home O2  . Depression   . GERD (gastroesophageal reflux disease)   . Headache   . Hypertension   . Stroke [facility]) 2006    Encounter Details: CNP Questionnaire - 07/29/17 1330      Questionnaire   Patient Status  Not Applicable    Race  White or Caucasian    Location Patient Stamps  Medicaid    Uninsured  Not Applicable    Food  Within past 12 months, worried food would run out with no money to buy more    Housing/Utilities  No permanent housing    Transportation  Yes, need transportation assistance;Provided transportation assistance (bus pass, taxi voucher, etc.)    Interpersonal Safety  No, do not feel physically and emotionally safe where you currently live    Medication  Yes, have medication insecurities    Medical Provider  Yes    Referrals  Emergency Department    ED Visit Averted  Not Applicable    Life-Saving Intervention Made  Yes      Clinical Intake - 07/16/17 1418      Pre-visit preparation   Pre-visit preparation completed  Yes      Pain   Pain   0-10    Pain Score  8     Pain Location  Head      Nutrition Screen   Diabetes  No      Functional Status   Activities of Daily Living  Independent    Ambulation  Independent    Medication Administration  Independent    Home Management  Independent      Abuse/Neglect   Do you feel unsafe in your current relationship?  No    Do you feel physically threatened by others?  No    Anyone hurting you at home, work, or school?  No    Unable to ask?  No      Investment banker, operational Needed?  No     CN returned to clinic to deliver client medication and counsel client. Client was slumped in corner outside  of building, states he "couldn't breath" and was having chest pain. Client points to left sternum as chest pain- non-radiating. EMS was notified at 1300 and client was assisted to clinic by CN and security. Client had bradycardic episode to 48 with Sao2 86-88%. Client c/o continuous chest pain. EMS arrived at 1315 and hand-off given by CN. Client to [facility].

## 2017-07-29 NOTE — ED Triage Notes (Signed)
Per EMS- Patient is homeless, but was at [employer] last night. Patient c/o SOB and wheezing.x 2 months. EMS gave Albuterol 10 mg and Atrovent 5 mg via neb prior to arrival to the ED.

## 2017-07-30 DIAGNOSIS — J441 Chronic obstructive pulmonary disease with (acute) exacerbation: Secondary | ICD-10-CM | POA: Diagnosis not present

## 2017-07-30 DIAGNOSIS — I10 Essential (primary) hypertension: Secondary | ICD-10-CM | POA: Diagnosis not present

## 2017-07-30 DIAGNOSIS — R071 Chest pain on breathing: Secondary | ICD-10-CM

## 2017-07-30 DIAGNOSIS — J9601 Acute respiratory failure with hypoxia: Secondary | ICD-10-CM | POA: Diagnosis not present

## 2017-07-30 DIAGNOSIS — J209 Acute bronchitis, unspecified: Secondary | ICD-10-CM

## 2017-07-30 DIAGNOSIS — J440 Chronic obstructive pulmonary disease with acute lower respiratory infection: Secondary | ICD-10-CM

## 2017-07-30 DIAGNOSIS — Z590 Homelessness: Secondary | ICD-10-CM

## 2017-07-30 MED ORDER — PREDNISONE 20 MG PO TABS: 40.0000 mg | ORAL_TABLET | Freq: Every day | ORAL | 0 refills | Status: DC

## 2017-07-30 MED ORDER — AZITHROMYCIN 500 MG PO TABS: 500.0000 mg | ORAL_TABLET | Freq: Every day | ORAL | 0 refills | Status: AC

## 2017-07-30 NOTE — Discharge Summary (Signed)
Physician Discharge Summary  Mehul Rudin GGY:[id number] DOB: 14-Apr-1956 DOA: 07/29/2017  PCP: Arnoldo Morale, MD  Admit date: 07/29/2017 Discharge date: 07/30/2017  Admitted From: Shelter Disposition: [facility]   Recommendations for Outpatient Follow-up:  1. Follow up with PCP in 1-2 weeks. Ongoing smoking cessation discussions.  Home Health: None Equipment/Devices: None Discharge Condition: Stable, improved CODE STATUS: Full Diet recommendation: Heart healthy  Brief/Interim Summary: Samuel Reyes is a 62 y.o. male w/ hx of etoh abuse, tobacco use, COPD and depression who came to the ED 07/29/2017 with dyspnea and cough w/ green sputum x 3-4 days with associated pleuritic chest pain. He was evaluated for same a few days prior, discharged with prescriptions for steroids, antibitoics, but had yet to get these filled due to living in a shelter, though the RN there had been in the process of getting these filled. Despite treatment in our ED, walking SpO2 demonstrated hypoxia. He was admitted for acute hypoxic respiratory failure from COPD exacerbation and acute bronchitis. He has improved with therapies and remained >98% on room air while ambulating this morning. He has received steroids and azithromycin today, and will have access to these prescriptions tomorrow.    Discharge Diagnoses:  Principal Problem:   COPD exacerbation ([place]) Active Problems:   Benign essential HTN   Acute respiratory failure with hypoxia (HCC)   Chest pain   Nausea vomiting and diarrhea   Homelessness   COPD with acute bronchitis (HCC)  Acute hypoxic respiratory failure: Resolved. CXR negative. - Continue Tx COPD w/exacerbation with home spiriva, prn albuterol (which has has) and prednisone burst w/azithromycin.   Tobacco use: Counseled, contemplative.   Homelessness: Has access to medications, however. Stays at the [facility] [place].   Discharge Instructions Discharge Instructions    Diet -  low sodium heart healthy   Complete by:  As directed    Discharge instructions   Complete by:  As directed    - Take prednisone every morning as directed until you run out of medication. You received your dose of steroids today.  - Take azithromycin daily for 3 days - If your symptoms worsen, seek medical attention     Allergies as of 07/30/2017      Reactions   Bee Venom Anaphylaxis   Penicillins Rash   Has patient had a PCN reaction causing immediate rash, facial/tongue/throat swelling, SOB or lightheadedness with hypotension: {Yes Has patient had a PCN reaction causing severe rash involving mucus membranes or skin necrosis: YES Has patient had a PCN reaction that required hospitalization Yes Has patient had a PCN reaction occurring within the last 10 years: YES If all of the above answers are "NO", then may proceed with Cephalosporin use.      Medication List    STOP taking these medications   FLOVENT HFA 110 MCG/ACT inhaler Generic drug:  fluticasone     TAKE these medications   acetaminophen 500 MG tablet Commonly known as:  TYLENOL Take 1,000 mg by mouth every 6 (six) hours as needed for mild pain, moderate pain, fever or headache.   albuterol 108 (90 Base) MCG/ACT inhaler Commonly known as:  PROVENTIL HFA;VENTOLIN HFA Inhale 1-2 puffs into the lungs every 4 (four) hours as needed for wheezing. What changed:  Another medication with the same name was removed. Continue taking this medication, and follow the directions you see here.   azithromycin 500 MG tablet Commonly known as:  ZITHROMAX Take 1 tablet (500 mg total) by mouth daily for 3 days. Take  1 tablet daily for 3 days.   buPROPion 200 MG 12 hr tablet Commonly known as:  WELLBUTRIN SR Take 200 mg by mouth at bedtime.   meloxicam 7.5 MG tablet Commonly known as:  MOBIC Take 1 tablet (7.5 mg total) by mouth daily.   metoprolol tartrate 50 MG tablet Commonly known as:  LOPRESSOR Take 1 tablet (50 mg total) by  mouth 2 (two) times daily.   multivitamin with minerals Tabs tablet Take 1 tablet by mouth daily.   predniSONE 20 MG tablet Commonly known as:  DELTASONE Take 2 tablets (40 mg total) by mouth daily. What changed:    medication strength  how much to take   SPIRIVA HANDIHALER 18 MCG inhalation capsule Generic drug:  tiotropium Place 1 puff into inhaler and inhale daily.   traZODone 100 MG tablet Commonly known as:  DESYREL Take 1 tablet (100 mg total) by mouth at bedtime.      Follow-up Information    Arnoldo Morale, MD Follow up.   Specialty:  Family Medicine Contact information: [address] [place] [address] [phone]          Allergies  Allergen Reactions  . Bee Venom Anaphylaxis  . Penicillins Rash    Has patient had a PCN reaction causing immediate rash, facial/tongue/throat swelling, SOB or lightheadedness with hypotension: {Yes Has patient had a PCN reaction causing severe rash involving mucus membranes or skin necrosis: YES Has patient had a PCN reaction that required hospitalization Yes Has patient had a PCN reaction occurring within the last 10 years: YES If all of the above answers are "NO", then may proceed with Cephalosporin use.     Consultations:  None  Procedures/Studies: Dg Chest 2 View  Result Date: 07/29/2017 CLINICAL DATA:  Shortness of breath, wheezing EXAM: CHEST  2 VIEW COMPARISON:  07/26/2017 FINDINGS: Lungs are essentially clear.  No focal consolidation No pleural effusion or pneumothorax. The heart is normal in size. Mild degenerative changes the lower thoracolumbar spine. Old right anterior rib fracture deformities. IMPRESSION: No evidence of acute cardiopulmonary disease. Electronically Signed   By: Julian Hy M.D.   On: 07/29/2017 15:10   Dg Chest 2 View  Result Date: 07/26/2017 CLINICAL DATA:  SOB x 1-2 months; hx COPD, CAD, HTN; smoker x 2 ppd 50 years EXAM: CHEST - 2 VIEW COMPARISON:  07/11/2017 and  previous the aortic calcifications. FINDINGS: Lungs are clear. Heart size and mediastinal contours are within normal limits. Aortic calcifications. No effusion. Postop change in the lateral left clavicle. Old right seventh rib fracture deformity. Surgical clips near the GE junction. IMPRESSION: No acute cardiopulmonary disease. Aortic Atherosclerosis (ICD10-170.0) Electronically Signed   By: Lucrezia Europe M.D.   On: 07/26/2017 08:48   Dg Chest 2 View  Result Date: 07/11/2017 CLINICAL DATA:  Initial evaluation for acute shortness of breath. History of asthma, COPD. EXAM: CHEST  2 VIEW COMPARISON:  Prior radiograph from 06/24/2017. FINDINGS: The cardiac and mediastinal silhouettes are stable in size and contour, and remain within normal limits. Aortic atherosclerosis. The lungs are normally inflated. Mild scattered chronic peribronchial thickening. No airspace consolidation, pleural effusion, or pulmonary edema is identified. There is no pneumothorax. No acute osseous abnormality identified. Remotely healed right-sided rib fracture noted. Surgical clips overlie the GE junction. IMPRESSION: 1. Chronic bronchitic changes. No other active cardiopulmonary disease. 2. Aortic atherosclerosis. Electronically Signed   By: Jeannine Boga M.D.   On: 07/11/2017 22:34   Subjective: Feels better, breathing easier, no dyspnea  with ambulation.   Discharge Exam: BP (!) 148/85 (BP Location: Left Arm)   Pulse 83   Temp 98.2 F (36.8 C) (Oral)   Resp 17   Ht 5\' 5"  (1.651 m)   Wt 68.6 kg (151 lb 3.8 oz)   SpO2 99%   BMI 25.17 kg/m   General: Pt is alert, awake, not in acute distress Cardiovascular: RRR, S1/S2 +, no rubs, no gallops Respiratory: Nonlabored and no wheezing or crackles.  Abdominal: Soft, NT, ND, bowel sounds + Extremities: No edema, no cyanosis  Labs: Basic Metabolic Panel: Recent Labs  Lab 07/26/17 1205 07/29/17 1520  NA 138 139  K 3.8 3.7  CL 108 107  CO2 25 24  GLUCOSE 92 84  BUN  13 8  CREATININE 0.82 0.73  CALCIUM 8.5* 8.4*   Liver Function Tests: Recent Labs  Lab 07/29/17 1520  AST 56*  ALT 35  ALKPHOS 66  BILITOT 0.3  PROT 7.0  ALBUMIN 3.5   CBC: Recent Labs  Lab 07/26/17 1205 07/29/17 1520  WBC 9.0 7.1  NEUTROABS  --  4.0  HGB 10.6* 11.2*  HCT 33.2* 36.1*  MCV 78.1 78.5  PLT 324 364    Time coordinating discharge: Approximately 40 minutes  Vance Gather, MD  [facility] 07/30/2017, 10:29 AM Pager [phone]

## 2017-07-30 NOTE — Clinical Social Work Note (Signed)
Clinical Social Work Assessment  Patient Details  Name: Samuel Reyes MRN: [id number] Date of Birth: [birth year]  Date of referral:  07/30/17               Reason for consult:  Facility Placement                Permission sought to share information with:  Facility Sport and exercise psychologist, Family Supports Permission granted to share information::  Yes, Verbal Permission Granted  Name::        Agency::     Relationship::     Contact Information:     Housing/Transportation Living arrangements for the past 2 months:  Homeless Source of Information:  Patient Patient Interpreter Needed:  None Criminal Activity/Legal Involvement Pertinent to Current Situation/Hospitalization:  No - Comment as needed Significant Relationships:  Parents, Siblings Lives with:  Other (Comment)(Shelter ) Do you feel safe going back to the place where you live?  Yes Need for family participation in patient care:  No (Coment)  Care giving concerns: Patient admitted for dyspnea and cough.  Patient has history of tobacco use and COPD. Patient reports he has difficulty walking and transporting in the community to pick up his medications. Patient request a bus pass back to shelter.    Social Worker assessment / plan: CSW met with patient at bedside, explain role. Patient reports he currently lives in a [facility] in [place]. Patient reports he has been living there for the past two months. He states he informed the shelter he was admitted to hospital and they are holding his bed.   Patient reports he has difficulty getting to his appointment and picking up his medications. CSW discussed [facility] transportation for individuals with medicaid. Patient reports the nurse at shelter completed an application about a week ago. The application is taking time to process due to the holiday.  Patient reports he walks to most of his destinations but has severe knee pain at times that prohibits walking. He is hopeful to have  knee surgery in the near future.   CSW provided patient with bus ticket to transport back to shelter.  Plan: CSW provided nurse with bus pass for patient   Employment status:  Retired Forensic scientist:  Medicaid [place] PT Recommendations:  No Follow Up Information / Referral to community resources:     Patient/Family's Response to care:  Agreeable to follow up with [facility] center and appreciative of CSW services.   Patient/Family's Understanding of and Emotional Response to Diagnosis, Current Treatment, and Prognosis:  Patient is knowledgeable to his diagnosis of COPD. Patient reports he is trying to quit smoking, he reports after 15 years it has been difficult to do so.   Emotional Assessment Appearance:  Appears younger than stated age Attitude/Demeanor/Rapport:    Affect (typically observed):  Accepting, Calm Orientation:  Oriented to Self, Oriented to Place, Oriented to  Time, Oriented to Situation Alcohol / Substance use:  Not Applicable Psych involvement (Current and /or in the community):  No (Comment)  Discharge Needs  Concerns to be addressed:  Homelessness Readmission within the last 30 days:  Yes Current discharge risk:  Homeless Barriers to Discharge:  No Barriers Identified   Lia Hopping, LCSW 07/30/2017, 11:32 AM

## 2017-07-30 NOTE — Progress Notes (Signed)
Reviewed discharge instructions with patient and went over changes in medication. Patient had no questions and expressed understanding. Removed patient's IV in Right Forearm- catheter was intact and there was no pain at the sight.  Due to bus routes and transportation being closed on New Year's Day, Social Work was called and patient was given a Chief Technology Officer for a cab. Patient left on wheelchair with voucher, prescriptions, and instructions with no questions.

## 2017-08-02 ENCOUNTER — Telehealth: Payer: Self-pay | Admitting: Family Medicine

## 2017-08-02 NOTE — Telephone Encounter (Signed)
Clinic nurse at [facility]) called to request a call back in regards to Samuel Reyes not being at the shelter for the last 4 days. Please follow up

## 2017-08-02 NOTE — Telephone Encounter (Signed)
Returned the call to [place] regarding patient. No answer. Left message on voicemail.  Called placed to the [facility] [phone], but [place] was gone for the day. Left message.

## 2017-08-02 NOTE — Telephone Encounter (Signed)
Call placed to patient at the [facility] [phone], to check on him. Spoke with Berenice Primas and she informed me that patient had left the facility and was not there. Berenice Primas took my information and stated that she would place it on the message board for patient to see it. She can't guarantee that he will return the call.   Call placed to [place] (congregational nurse) [phone], to speak with her regarding patient and any updates that she may have. No answer. Left message asking her to return my call at [phone].

## 2017-08-05 ENCOUNTER — Telehealth: Payer: Self-pay

## 2017-08-05 ENCOUNTER — Encounter: Payer: Self-pay | Admitting: Pediatric Intensive Care

## 2017-08-05 MED FILL — predniSONE 20 MG TABS: 20 | 4 days supply | Qty: 8 | Fill #0

## 2017-08-05 MED FILL — AZITHROMYCIN 500 MG TABLET: 500 | 3 days supply | Qty: 3 | Fill #0

## 2017-08-05 NOTE — Telephone Encounter (Signed)
Call received from [place], [place]. She stated that the patient has returned to [facility] after not being able to locate him for 4 days. He is now a Brewing technologist. She reported that he has not had any medications since he was discharged from the hospital on 07/30/17 and she wanted to know if Dr Jarold Song still wanted him to take the prednisone and azithromycin. She stated that the patient is breathing easy, lungs clear and O2 sat 94%. The patient has an appointment with Dr Jarold Song tomorrow  - 08/06/17 @ 1000 and [place] noted that they will provide cab transportation to the [facility] for the patient and [facility] can provide cab transport back to [facility] after the appointment.   This CM spoke to Dr Jarold Song regarding the need for azithromycin and prednisone. She stated that the patient should take those medications. This information was shared with [place] H who noted that she will pick up those medications at the pharmacy today for the patient.

## 2017-08-06 ENCOUNTER — Telehealth: Payer: Self-pay

## 2017-08-06 ENCOUNTER — Ambulatory Visit: Payer: Self-pay | Admitting: Family Medicine

## 2017-08-06 NOTE — Telephone Encounter (Signed)
Call received from [facility], [place] stating that the patient is not at the [facility] this morning and she is not sure where he is.

## 2017-08-06 NOTE — Telephone Encounter (Addendum)
Opened in error

## 2017-08-07 ENCOUNTER — Encounter (HOSPITAL_COMMUNITY): Payer: Self-pay | Admitting: Emergency Medicine

## 2017-08-07 ENCOUNTER — Emergency Department (HOSPITAL_COMMUNITY): Payer: Medicaid Other

## 2017-08-07 ENCOUNTER — Inpatient Hospital Stay (HOSPITAL_COMMUNITY)
Admission: EM | Admit: 2017-08-07 | Discharge: 2017-08-09 | DRG: 897 | Disposition: A | Discharge status: Home / Self Care | Payer: Medicaid Other | Attending: Family Medicine | Admitting: Family Medicine

## 2017-08-07 DIAGNOSIS — R079 Chest pain, unspecified: Secondary | ICD-10-CM | POA: Diagnosis present

## 2017-08-07 DIAGNOSIS — F10231 Alcohol dependence with withdrawal delirium: Principal | ICD-10-CM | POA: Diagnosis present

## 2017-08-07 DIAGNOSIS — F1010 Alcohol abuse, uncomplicated: Secondary | ICD-10-CM

## 2017-08-07 DIAGNOSIS — Z981 Arthrodesis status: Secondary | ICD-10-CM

## 2017-08-07 DIAGNOSIS — I252 Old myocardial infarction: Secondary | ICD-10-CM

## 2017-08-07 DIAGNOSIS — Z8673 Personal history of transient ischemic attack (TIA), and cerebral infarction without residual deficits: Secondary | ICD-10-CM

## 2017-08-07 DIAGNOSIS — Z7952 Long term (current) use of systemic steroids: Secondary | ICD-10-CM

## 2017-08-07 DIAGNOSIS — J449 Chronic obstructive pulmonary disease, unspecified: Secondary | ICD-10-CM | POA: Diagnosis present

## 2017-08-07 DIAGNOSIS — Z9119 Patient's noncompliance with other medical treatment and regimen: Secondary | ICD-10-CM

## 2017-08-07 DIAGNOSIS — Z91030 Bee allergy status: Secondary | ICD-10-CM

## 2017-08-07 DIAGNOSIS — R000 Tachycardia, unspecified: Secondary | ICD-10-CM | POA: Diagnosis present

## 2017-08-07 DIAGNOSIS — Z880 Allergy status to penicillin: Secondary | ICD-10-CM

## 2017-08-07 DIAGNOSIS — Z79899 Other long term (current) drug therapy: Secondary | ICD-10-CM

## 2017-08-07 DIAGNOSIS — Z590 Homelessness: Secondary | ICD-10-CM

## 2017-08-07 DIAGNOSIS — F17210 Nicotine dependence, cigarettes, uncomplicated: Secondary | ICD-10-CM | POA: Diagnosis present

## 2017-08-07 DIAGNOSIS — I2510 Atherosclerotic heart disease of native coronary artery without angina pectoris: Secondary | ICD-10-CM | POA: Diagnosis present

## 2017-08-07 DIAGNOSIS — F329 Major depressive disorder, single episode, unspecified: Secondary | ICD-10-CM | POA: Diagnosis present

## 2017-08-07 DIAGNOSIS — F10939 Alcohol use, unspecified with withdrawal, unspecified: Secondary | ICD-10-CM | POA: Diagnosis present

## 2017-08-07 DIAGNOSIS — R45851 Suicidal ideations: Secondary | ICD-10-CM | POA: Diagnosis present

## 2017-08-07 DIAGNOSIS — R51 Headache: Secondary | ICD-10-CM | POA: Diagnosis present

## 2017-08-07 DIAGNOSIS — F10239 Alcohol dependence with withdrawal, unspecified: Secondary | ICD-10-CM | POA: Diagnosis present

## 2017-08-07 DIAGNOSIS — Z791 Long term (current) use of non-steroidal anti-inflammatories (NSAID): Secondary | ICD-10-CM

## 2017-08-07 DIAGNOSIS — F419 Anxiety disorder, unspecified: Secondary | ICD-10-CM | POA: Diagnosis present

## 2017-08-07 DIAGNOSIS — I10 Essential (primary) hypertension: Secondary | ICD-10-CM | POA: Diagnosis present

## 2017-08-07 DIAGNOSIS — Y907 Blood alcohol level of 200-239 mg/100 ml: Secondary | ICD-10-CM | POA: Diagnosis present

## 2017-08-07 DIAGNOSIS — Z9114 Patient's other noncompliance with medication regimen: Secondary | ICD-10-CM

## 2017-08-07 LAB — COMPREHENSIVE METABOLIC PANEL
ALT: 21 U/L (ref 17–63)
AST: 31 U/L (ref 15–41)
Albumin: 4 g/dL (ref 3.5–5.0)
Alkaline Phosphatase: 86 U/L (ref 38–126)
Anion gap: 12 (ref 5–15)
BILIRUBIN TOTAL: 0.3 mg/dL (ref 0.3–1.2)
BUN: 8 mg/dL (ref 6–20)
CALCIUM: 8.6 mg/dL — AB (ref 8.9–10.3)
CHLORIDE: 105 mmol/L (ref 101–111)
CO2: 22 mmol/L (ref 22–32)
CREATININE: 0.6 mg/dL — AB (ref 0.61–1.24)
Glucose, Bld: 87 mg/dL (ref 65–99)
Potassium: 4.2 mmol/L (ref 3.5–5.1)
Sodium: 139 mmol/L (ref 135–145)
TOTAL PROTEIN: 7.7 g/dL (ref 6.5–8.1)

## 2017-08-07 LAB — I-STAT TROPONIN, ED: Troponin i, poc: 0.01 ng/mL (ref 0.00–0.08)

## 2017-08-07 LAB — CBC
HCT: 42.1 % (ref 39.0–52.0)
Hemoglobin: 13.4 g/dL (ref 13.0–17.0)
MCH: 24.3 pg — AB (ref 26.0–34.0)
MCHC: 31.8 g/dL (ref 30.0–36.0)
MCV: 76.4 fL — ABNORMAL LOW (ref 78.0–100.0)
PLATELETS: 377 10*3/uL (ref 150–400)
RBC: 5.51 MIL/uL (ref 4.22–5.81)
RDW: 17.6 % — AB (ref 11.5–15.5)
WBC: 6.1 10*3/uL (ref 4.0–10.5)

## 2017-08-07 LAB — ACETAMINOPHEN LEVEL: Acetaminophen (Tylenol), Serum: <10 ug/mL — ABNORMAL LOW (ref 10–30)

## 2017-08-07 LAB — RAPID URINE DRUG SCREEN, HOSP PERFORMED
Amphetamines: NOT DETECTED
Barbiturates: NOT DETECTED
Benzodiazepines: POSITIVE — AB
Cocaine: NOT DETECTED
OPIATES: NOT DETECTED
Tetrahydrocannabinol: NOT DETECTED

## 2017-08-07 LAB — ETHANOL: ALCOHOL ETHYL (B): 230 mg/dL — AB (ref <10)

## 2017-08-07 LAB — PROTIME-INR
INR: 0.9
Prothrombin Time: 12 seconds (ref 11.4–15.2)

## 2017-08-07 LAB — TROPONIN I: Troponin I: <0.03 ng/mL (ref <0.03)

## 2017-08-07 LAB — SALICYLATE LEVEL

## 2017-08-07 MED ORDER — ASPIRIN 81 MG PO CHEW: 324.0000 mg | CHEWABLE_TABLET | Freq: Once | ORAL | Status: DC

## 2017-08-07 MED ORDER — LORAZEPAM 2 MG/ML IJ SOLN
1.0000 mg | Freq: Once | INTRAMUSCULAR | Status: AC
  Administered 2017-08-07: 1 mg via INTRAVENOUS

## 2017-08-07 MED ORDER — VITAMIN B-1 100 MG PO TABS
100.0000 mg | ORAL_TABLET | Freq: Every day | ORAL | Status: DC
  Administered 2017-08-07 – 2017-08-09 (×3): 100 mg via ORAL
  Filled 2017-08-07 (×3): qty 1

## 2017-08-07 MED ORDER — GI COCKTAIL ~~LOC~~
30.0000 mL | Freq: Once | ORAL | Status: AC
  Administered 2017-08-07: 30 mL via ORAL
  Filled 2017-08-07: qty 30

## 2017-08-07 MED ORDER — SODIUM CHLORIDE 0.9 % IV BOLUS (SEPSIS)
1000.0000 mL | Freq: Once | INTRAVENOUS | Status: AC
  Administered 2017-08-08: 1000 mL via INTRAVENOUS

## 2017-08-07 MED ORDER — LORAZEPAM 1 MG PO TABS: 0.0000 mg | ORAL_TABLET | Freq: Two times a day (BID) | ORAL | Status: DC

## 2017-08-07 MED ORDER — LORAZEPAM 1 MG PO TABS
0.0000 mg | ORAL_TABLET | Freq: Four times a day (QID) | ORAL | Status: AC
  Administered 2017-08-07 (×2): 1 mg via ORAL
  Filled 2017-08-07 (×2): qty 1

## 2017-08-07 MED ORDER — METOPROLOL TARTRATE 50 MG PO TABS
50.0000 mg | ORAL_TABLET | Freq: Two times a day (BID) | ORAL | Status: DC
  Administered 2017-08-07 – 2017-08-09 (×4): 50 mg via ORAL
  Filled 2017-08-07: qty 1
  Filled 2017-08-07: qty 2
  Filled 2017-08-07 (×2): qty 1

## 2017-08-07 MED ORDER — SODIUM CHLORIDE 0.9 % IV SOLN: INTRAVENOUS | Status: DC

## 2017-08-07 MED ORDER — LORAZEPAM 2 MG/ML IJ SOLN
0.0000 mg | Freq: Four times a day (QID) | INTRAMUSCULAR | Status: AC
  Administered 2017-08-08: 4 mg via INTRAVENOUS
  Administered 2017-08-08 (×2): 2 mg via INTRAVENOUS
  Filled 2017-08-07: qty 2
  Filled 2017-08-07 (×3): qty 1

## 2017-08-07 MED ORDER — LORAZEPAM 2 MG/ML IJ SOLN: 0.0000 mg | Freq: Two times a day (BID) | INTRAMUSCULAR | Status: DC

## 2017-08-07 MED ORDER — THIAMINE HCL 100 MG/ML IJ SOLN: 100.0000 mg | Freq: Every day | INTRAMUSCULAR | Status: DC

## 2017-08-07 NOTE — ED Triage Notes (Signed)
Patient from [facility] by [facility] for SI and being cold. Patient was transported last week for COPD and wheezing and was given Zpak which patient hasnt been compliant with. Patient denies HI.

## 2017-08-07 NOTE — ED Notes (Signed)
Patient placed in hall bed. Patient resting comfortably with eyes closed. No acute needs identified at this time.

## 2017-08-07 NOTE — BH Assessment (Addendum)
Assessment Note  Samuel Reyes is a 62 y.o. male who is voluntarily at [facility] due to depression with associated SI. During assessment, pt maintains that he wants to die and that he has given up on living. Pt is homeless, currently residing at the homeless shelter. He rec's SSI income and reports that the shelter is working on finding him a place to stay. Pt reports that his wife died 4 years ago and his son died 6 years ago. Pt reports being all alone and not seeing a need to live anymore. Pt admits that he is not psych med compliant and never f/u with [facility] like he was referred to do. Pt initially said that he hadn't had a drink yesterday, but later admitted that he did drink some beer today. No HI, AVH.  *It is noted that pt reported blurred vision for the past few weeks.    Diagnosis: MDD, recurrent episode, severe  Past Medical History:  Past Medical History:  Diagnosis Date  . Alcoholism ([place])   . CAD (coronary artery disease) 2002   MI, no intervention required  . COPD (chronic obstructive pulmonary disease) (HCC)    not on home O2  . Depression   . GERD (gastroesophageal reflux disease)   . Headache   . Hypertension   . Stroke [facility]) 2006    Past Surgical History:  Procedure Laterality Date  . BACK SURGERY     3 cervical spine surgeries C4-C5 fused  . COLONOSCOPY N/A 01/04/2014   Procedure: COLONOSCOPY;  Surgeon: Danie Binder, MD;  [facility];  Service: Endoscopy;  Laterality: N/A;  1:45  . ESOPHAGOGASTRODUODENOSCOPY N/A 01/04/2014   Procedure: ESOPHAGOGASTRODUODENOSCOPY (EGD);  Surgeon: Danie Binder, MD;  Location[facility]O SU[facility];  Service: Endoscopy;  Laterality: N/A;  . FINGER SURGERY Left    2nd, 3rd, & 4th fingers were cut off by table saw and reattached  . GASTRECTOMY    . HERNIA REPAIR    . INCISIONAL HERNIA REPAIR N/A 01/20/2014   Procedure: LAPAROSCOPIC RECURRENT  INCISIONAL HERNIA with mesh;  Surgeon: Edward Jolly, MD;  Location: [facility] ORS;  Service:  General;  Laterality: N/A;  . rt knee arthroscopic surgery    . SHOULDER SURGERY Bilateral    3 surgeries on on left, 2 surgeries on right     Family History:  Family History  Problem Relation Age of Onset  . Cancer Father        bone  . Cancer Brother        lungs  . Stroke Maternal Grandmother   . Asthma Son        died at age 85 in his sleep   . Spina bifida Son        died at age 64   . Dementia Mother   . Colon cancer Neg Hx     Social History:  reports that he has been smoking cigarettes.  He started smoking about 51 years ago. He has a 104.00 pack-year smoking history. he has never used smokeless tobacco. He reports that he drinks about 1.8 - 2.4 oz of alcohol per week. He reports that he does not use drugs.  Additional Social History:  Alcohol / Drug Use Pain Medications: see PTA meds Prescriptions: see PTA meds Over the Counter: see PTA meds History of alcohol / drug use?: Yes Substance #1 Name of Substance 1: Alcohol 1 - Frequency: daily 1 - Duration: ongoing 1 - Last Use / Amount: this morning, "  a little bit of a 40oz"  CIWA: CIWA-Ar BP: 126/82 Pulse Rate: (!) 116 Nausea and Vomiting: mild nausea with no vomiting Tactile Disturbances: none Tremor: not visible, but can be felt fingertip to fingertip Auditory Disturbances: not present Paroxysmal Sweats: no sweat visible Visual Disturbances: not present Anxiety: mildly anxious Headache, Fullness in Head: very mild Agitation: normal activity Orientation and Clouding of Sensorium: oriented and can do serial additions CIWA-Ar Total: 4 COWS:    Allergies:  Allergies  Allergen Reactions  . Bee Venom Anaphylaxis  . Penicillins Rash    Has patient had a PCN reaction causing immediate rash, facial/tongue/throat swelling, SOB or lightheadedness with hypotension: {Yes Has patient had a PCN reaction causing severe rash involving mucus membranes or skin necrosis: YES Has patient had a PCN reaction that required  hospitalization Yes Has patient had a PCN reaction occurring within the last 10 years: YES If all of the above answers are "NO", then may proceed with Cephalosporin use.     Home Medications:  (Not in a hospital admission)  OB/GYN Status:  No LMP for male patient.  General Assessment Data [facility]: [facility] TTS Assessment: In system Is this a Tele or Face-to-Face Assessment?: Face-to-Face Is this an Initial Assessment or a Re-assessment for this encounter?: Initial Assessment Marital status: Widowed Living Arrangements: Other (Comment)(Homeless Technical brewer) Can pt return to current living arrangement?: Yes Admission Status: Voluntary Is patient capable of signing voluntary admission?: Yes Referral Source: Self/Family/Friend Insurance type: Medicaid     Crisis Care Plan Living Arrangements: Other (Comment)(Homeless shelter-[facility]) Name of Psychiatrist: none Name of Therapist: none  Education Status Is patient currently in school?: No  Risk to self with the past 6 months Suicidal Ideation: Yes-Currently Present Has patient been a risk to self within the past 6 months prior to admission? : No Suicidal Intent: Yes-Currently Present Has patient had any suicidal intent within the past 6 months prior to admission? : No Is patient at risk for suicide?: Yes Suicidal Plan?: Yes-Currently Present Has patient had any suicidal plan within the past 6 months prior to admission? : No Specify Current Suicidal Plan: walk in front of a car Access to Means: Yes Previous Attempts/Gestures: Yes Triggers for Past Attempts: Unknown Intentional Self Injurious Behavior: None Family Suicide History: Unknown Recent stressful life event(s): Recent negative physical changes Persecutory voices/beliefs?: No Depression: Yes Depression Symptoms: Feeling worthless/self pity, Loss of interest in usual pleasures, Fatigue Substance abuse history and/or treatment for substance  abuse?: Yes Suicide prevention information given to non-admitted patients: Not applicable  Risk to Others within the past 6 months Homicidal Ideation: No Does patient have any lifetime risk of violence toward others beyond the six months prior to admission? : No Thoughts of Harm to Others: No Current Homicidal Intent: No Current Homicidal Plan: No Access to Homicidal Means: No History of harm to others?: No Assessment of Violence: None Noted Does patient have access to weapons?: No Criminal Charges Pending?: No Does patient have a court date: No Is patient on probation?: No  Psychosis Hallucinations: None noted Delusions: None noted  Mental Status Report Appearance/Hygiene: Unremarkable Eye Contact: Good Motor Activity: Unremarkable Speech: Logical/coherent Level of Consciousness: Alert Mood: Helpless Affect: Appropriate to circumstance Anxiety Level: Minimal Thought Processes: Coherent, Relevant Judgement: Partial Orientation: Place, Time, Person, Situation Obsessive Compulsive Thoughts/Behaviors: None  Cognitive Functioning Concentration: Normal Memory: Recent Intact IQ: Average Insight: Fair Impulse Control: Fair Appetite: Fair Sleep: No Change Vegetative Symptoms: None  ADLScreening [facility] Assessment  Services) Patient's cognitive ability adequate to safely complete daily activities?: Yes Patient able to express need for assistance with ADLs?: Yes Independently performs ADLs?: Yes (appropriate for developmental age)  Prior Inpatient Therapy Prior Inpatient Therapy: Yes Prior Therapy Dates: multiple (last time 04/2017) Prior Therapy Facilty/Provider(s): multiple (last time was at [facility]) Reason for Treatment: depression  Prior Outpatient Therapy Prior Outpatient Therapy: Yes Prior Therapy Facilty/Provider(s): [facility] Reason for Treatment: depression Does patient have an ACCT team?: No Does patient have Intensive In-House Services?  : No Does  patient have [facility] services? : Unknown Does patient have P4CC services?: No  ADL Screening (condition at time of admission) Patient's cognitive ability adequate to safely complete daily activities?: Yes Is the patient deaf or have difficulty hearing?: No Does the patient have difficulty seeing, even when wearing glasses/contacts?: Yes Does the patient have difficulty concentrating, remembering, or making decisions?: Yes Patient able to express need for assistance with ADLs?: Yes Does the patient have difficulty dressing or bathing?: No Independently performs ADLs?: Yes (appropriate for developmental age) Does the patient have difficulty walking or climbing stairs?: Yes Weakness of Legs: Both Weakness of Arms/Hands: Both  Home Assistive Devices/Equipment Home Assistive Devices/Equipment: None    Abuse/Neglect Assessment (Assessment to be complete while patient is alone) Abuse/Neglect Assessment Can Be Completed: Yes Physical Abuse: Denies Verbal Abuse: Denies Sexual Abuse: Denies Exploitation of patient/patient's resources: Denies Self-Neglect: Denies Values / Beliefs Cultural Requests During Hospitalization: None Spiritual Requests During Hospitalization: None   Advance Directives (For Healthcare) Does Patient Have a Medical Advance Directive?: No    Additional Information 1:1 In Past 12 Months?: No CIRT Risk: No Elopement Risk: No Does patient have medical clearance?: Yes     Disposition:  Disposition Initial Assessment Completed for this Encounter: Yes(consulted with Jinny Blossom, NP) Disposition of Patient: Re-evaluation by Psychiatry recommended(Pt recommended to be observed overnight for stability. )  EDP [place] notified of disposition.   On Site Evaluation by:   Reviewed with Physician:    Rexene Edison 08/07/2017 3:04 PM

## 2017-08-07 NOTE — ED Provider Notes (Signed)
[place] DEPT Provider Note   CSN: [id number] Arrival date & time: 08/07/17  0858     History   Chief Complaint Chief Complaint  Patient presents with  . Suicidal  . Chest Pain    HPI Samuel Reyes is a 62 y.o. male.  The history is provided by the patient. No language interpreter was used.  Chest Pain   This is a new problem. The current episode started more than 1 week ago. The problem occurs constantly. The problem has been gradually worsening. The pain is associated with exertion. The pain is present in the substernal region and epigastric region. The pain is moderate. The pain radiates to the epigastrium. Risk factors include male gender, being elderly, stress and smoking/tobacco exposure.  Pt reports he tired and does not want to live anymore. Pt reports he is homeless.  He reports he is lonely and feels hopeless.  Pt reports he drinks everyday.  He reports he is shaky because he hasn't drank today.  Pt complains of feeling bad all over   Past Medical History:  Diagnosis Date  . Alcoholism ([place])   . CAD (coronary artery disease) 2002   MI, no intervention required  . COPD (chronic obstructive pulmonary disease) (HCC)    not on home O2  . Depression   . GERD (gastroesophageal reflux disease)   . Headache   . Hypertension   . Stroke [facility]) 2006    Patient Active Problem List   Diagnosis Date Noted  . COPD with acute bronchitis ([place]) 07/29/2017  . Alcohol use   . Homelessness 06/24/2017  . Lung nodule 09/03/2016  . Tobacco abuse 09/03/2016  . Insomnia 09/03/2016  . HCAP (healthcare-associated pneumonia) 08/22/2016  . Bronchiolitis 08/21/2016  . Malnutrition of moderate degree 08/04/2016  . Atrial tachycardia ([place]) 08/02/2016  . Impaired glucose tolerance 08/02/2016  . Alcohol-induced mood disorder ([place]) 12/12/2015  . Syncope 11/15/2015  . Chest pain 11/15/2015  . Nausea vomiting and diarrhea 11/15/2015  . Abdominal pain  11/15/2015  . Major depressive disorder, recurrent episode, moderate with anxious distress ([place]) 10/30/2015  . Alcohol use disorder, severe, dependence ([place]) 10/29/2015  . COPD exacerbation ([place]) 09/20/2015  . Acute respiratory failure with hypoxia ([place]) 09/18/2015  . Suicidal ideation 09/17/2015  . Alcohol intoxication ([place]) 09/17/2015  . Depression 09/17/2015  . Benign essential HTN 09/17/2015  . Hypokalemia 09/17/2015  . Hyponatremia 09/17/2015  . Coffee ground emesis 09/17/2015  . COPD (chronic obstructive pulmonary disease) ([place]) 02/17/2013  . GERD (gastroesophageal reflux disease) 02/17/2013    Past Surgical History:  Procedure Laterality Date  . BACK SURGERY     3 cervical spine surgeries C4-C5 fused  . COLONOSCOPY N/A 01/04/2014   Procedure: COLONOSCOPY;  Surgeon: Danie Binder, MD;  [facility];  Service: Endoscopy;  Laterality: N/A;  1:45  . ESOPHAGOGASTRODUODENOSCOPY N/A 01/04/2014   Procedure: ESOPHAGOGASTRODUODENOSCOPY (EGD);  Surgeon: Danie Binder, MD;  [facility];  Service: Endoscopy;  Laterality: N/A;  . FINGER SURGERY Left    2nd, 3rd, & 4th fingers were cut off by table saw and reattached  . GASTRECTOMY    . HERNIA REPAIR    . INCISIONAL HERNIA REPAIR N/A 01/20/2014   Procedure: LAPAROSCOPIC RECURRENT  INCISIONAL HERNIA with mesh;  Surgeon: Edward Jolly, MD;  Location: [facility] ORS;  Service: General;  Laterality: N/A;  . rt knee arthroscopic surgery    . SHOULDER SURGERY Bilateral    3 surgeries on on  left, 2 surgeries on right        Home Medications    Prior to Admission medications   Medication Sig Start Date End Date Taking? Authorizing Provider  acetaminophen (TYLENOL) 500 MG tablet Take 1,000 mg by mouth every 6 (six) hours as needed for mild pain, moderate pain, fever or headache.    [provider]  albuterol (PROVENTIL HFA;VENTOLIN HFA) 108 (90 Base) MCG/ACT inhaler Inhale 1-2 puffs into the lungs every 4 (four)  hours as needed for wheezing. 08/29/16   Arnoldo Morale, MD  buPROPion (WELLBUTRIN SR) 200 MG 12 hr tablet Take 200 mg by mouth at bedtime. 07/26/17   [provider]  meloxicam (MOBIC) 7.5 MG tablet Take 1 tablet (7.5 mg total) by mouth daily. 07/16/17   Arnoldo Morale, MD  metoprolol (LOPRESSOR) 50 MG tablet Take 1 tablet (50 mg total) by mouth 2 (two) times daily. 09/03/16   Arnoldo Morale, MD  Multiple Vitamin (MULTIVITAMIN WITH MINERALS) TABS tablet Take 1 tablet by mouth daily. 07/02/17   Barton Dubois, MD  predniSONE (DELTASONE) 20 MG tablet Take 2 tablets (40 mg total) by mouth daily. 07/30/17   Patrecia Pour, MD  SPIRIVA HANDIHALER 18 MCG inhalation capsule Place 1 puff into inhaler and inhale daily. 07/03/17   [provider]  traZODone (DESYREL) 100 MG tablet Take 1 tablet (100 mg total) by mouth at bedtime. 07/01/17   Barton Dubois, MD    Family History Family History  Problem Relation Age of Onset  . Cancer Father        bone  . Cancer Brother        lungs  . Stroke Maternal Grandmother   . Asthma Son        died at age 60 in his sleep   . Spina bifida Son        died at age 34   . Dementia Mother   . Colon cancer Neg Hx     Social History Social History   Tobacco Use  . Smoking status: Current Every Day Smoker    Packs/day: 2.00    Years: 52.00    Pack years: 104.00    Types: Cigarettes    Start date: 07/30/1966  . Smokeless tobacco: Never Used  . Tobacco comment: down to 1/4 ppd  Substance Use Topics  . Alcohol use: Yes    Alcohol/week: 1.8 - 2.4 oz    Types: 3 - 4 Cans of beer per week    Comment: everyday- 3-4 40 oz  . Drug use: No    Comment: denied using any drugs     Allergies   Bee venom and Penicillins   Review of Systems Review of Systems  Cardiovascular: Positive for chest pain.  All other systems reviewed and are negative.    Physical Exam Updated Vital Signs BP (!) 141/90 (BP Location: Right Arm)   Pulse 81   Temp (!) 97.3  F (36.3 C) (Oral)   Resp 19   Ht 5\' 5"  (1.651 m)   Wt 68.5 kg (151 lb)   SpO2 99%   BMI 25.13 kg/m   Physical Exam  Constitutional: He appears well-developed and well-nourished.  HENT:  Head: Normocephalic and atraumatic.  Eyes: Conjunctivae and EOM are normal. Pupils are equal, round, and reactive to light.  Neck: Neck supple.  Cardiovascular: Normal rate and regular rhythm.  No murmur heard. Pulmonary/Chest: Effort normal. No respiratory distress. He has rhonchi.  Abdominal: Soft. There is tenderness.  Musculoskeletal: He exhibits no edema.  Neurological: He is alert.  Skin: Skin is warm and dry.  Psychiatric: He has a normal mood and affect.  Nursing note and vitals reviewed.    ED Treatments / Results  Labs (all labs ordered are listed, but only abnormal results are displayed) Labs Reviewed  COMPREHENSIVE METABOLIC PANEL  ETHANOL  SALICYLATE LEVEL  ACETAMINOPHEN LEVEL  CBC  RAPID URINE DRUG SCREEN, HOSP PERFORMED  TROPONIN I  PROTIME-INR    EKG  EKG Interpretation None       Radiology No results found.  Procedures Procedures (including critical care time)  Medications Ordered in ED Medications  LORazepam (ATIVAN) injection 0-4 mg ( Intravenous See Alternative 08/07/17 1101)    Or  LORazepam (ATIVAN) tablet 0-4 mg (0 mg Oral Not Given 08/07/17 1101)  LORazepam (ATIVAN) injection 0-4 mg (not administered)    Or  LORazepam (ATIVAN) tablet 0-4 mg (not administered)  thiamine (VITAMIN B-1) tablet 100 mg (not administered)    Or  thiamine (B-1) injection 100 mg (not administered)  gi cocktail (Maalox,Lidocaine,Donnatal) (not administered)     Initial Impression / Assessment and Plan / ED Course  I have reviewed the triage vital signs and the nursing notes.  Pertinent labs & imaging results that were available during my care of the patient were reviewed by me and considered in my medical decision making (see chart for details).       Final  Clinical Impressions(s) / ED Diagnoses   Final diagnoses:  ETOH abuse  Chest pain, unspecified type    ED Discharge Orders    None       Fransico Meadow, [place] 08/08/17 Laurie, MD 08/08/17 [phone]

## 2017-08-07 NOTE — ED Provider Notes (Signed)
Care assumed from previous provider PA [place]. Please see note for further details. Case discussed, plan agreed upon. Per previous provider, patient medically cleared with dispo per TTS recommendations. Hx of ETOH abuse and on CIWA protocol. Will follow up on TTS recommendations.   TTS recommends overnight observation and to see psych in am.   Nursing notified me that patient has HR of 130's-140's and still complaining of chest pain. Patient evaluated and tachycardic 130's. He is not having any tremors BP wdl. Only complaint at this time is chest pain. Repeat EKG sinus tachycardia. Repeat troponin negative. Patient does have lopressor on med list and has not had nightly does while in ED. Will give home lopressor and 1 mg ativan then reassess.   HR still in 110's-120's after medication. Given chest pain, tachycardia and ETOH withdrawal, hospitalist to admit.    Ward, Ozella Almond, PA-C 08/08/17 0014    Davonna Belling, MD 08/08/17 [phone]

## 2017-08-07 NOTE — ED Notes (Signed)
Delay on EKG. Had to take 2 patients upstairs.

## 2017-08-07 NOTE — ED Triage Notes (Addendum)
Patient c/o central chest pain that is constant for "while" and does get worse with coughing. Patient reports that when he coughs he gets up bright red blood for "while".

## 2017-08-08 ENCOUNTER — Other Ambulatory Visit: Payer: Self-pay

## 2017-08-08 DIAGNOSIS — R079 Chest pain, unspecified: Secondary | ICD-10-CM | POA: Diagnosis present

## 2017-08-08 DIAGNOSIS — Z810 Family history of intellectual disabilities: Secondary | ICD-10-CM | POA: Diagnosis not present

## 2017-08-08 DIAGNOSIS — F10239 Alcohol dependence with withdrawal, unspecified: Secondary | ICD-10-CM | POA: Diagnosis not present

## 2017-08-08 DIAGNOSIS — F419 Anxiety disorder, unspecified: Secondary | ICD-10-CM | POA: Diagnosis present

## 2017-08-08 DIAGNOSIS — F10231 Alcohol dependence with withdrawal delirium: Secondary | ICD-10-CM | POA: Diagnosis not present

## 2017-08-08 DIAGNOSIS — F10939 Alcohol use, unspecified with withdrawal, unspecified: Secondary | ICD-10-CM | POA: Diagnosis present

## 2017-08-08 DIAGNOSIS — Z9119 Patient's noncompliance with other medical treatment and regimen: Secondary | ICD-10-CM | POA: Diagnosis not present

## 2017-08-08 DIAGNOSIS — Z590 Homelessness: Secondary | ICD-10-CM | POA: Diagnosis not present

## 2017-08-08 DIAGNOSIS — Z8673 Personal history of transient ischemic attack (TIA), and cerebral infarction without residual deficits: Secondary | ICD-10-CM | POA: Diagnosis not present

## 2017-08-08 DIAGNOSIS — R1013 Epigastric pain: Secondary | ICD-10-CM | POA: Diagnosis not present

## 2017-08-08 DIAGNOSIS — R51 Headache: Secondary | ICD-10-CM | POA: Diagnosis present

## 2017-08-08 DIAGNOSIS — F329 Major depressive disorder, single episode, unspecified: Secondary | ICD-10-CM | POA: Diagnosis present

## 2017-08-08 DIAGNOSIS — J449 Chronic obstructive pulmonary disease, unspecified: Secondary | ICD-10-CM | POA: Diagnosis present

## 2017-08-08 DIAGNOSIS — F39 Unspecified mood [affective] disorder: Secondary | ICD-10-CM | POA: Diagnosis not present

## 2017-08-08 DIAGNOSIS — Z91030 Bee allergy status: Secondary | ICD-10-CM | POA: Diagnosis not present

## 2017-08-08 DIAGNOSIS — F1010 Alcohol abuse, uncomplicated: Secondary | ICD-10-CM | POA: Diagnosis present

## 2017-08-08 DIAGNOSIS — R000 Tachycardia, unspecified: Secondary | ICD-10-CM | POA: Diagnosis present

## 2017-08-08 DIAGNOSIS — Y907 Blood alcohol level of 200-239 mg/100 ml: Secondary | ICD-10-CM | POA: Diagnosis present

## 2017-08-08 DIAGNOSIS — Z9114 Patient's other noncompliance with medication regimen: Secondary | ICD-10-CM | POA: Diagnosis not present

## 2017-08-08 DIAGNOSIS — I10 Essential (primary) hypertension: Secondary | ICD-10-CM | POA: Diagnosis present

## 2017-08-08 DIAGNOSIS — Z7952 Long term (current) use of systemic steroids: Secondary | ICD-10-CM | POA: Diagnosis not present

## 2017-08-08 DIAGNOSIS — R45851 Suicidal ideations: Secondary | ICD-10-CM | POA: Diagnosis present

## 2017-08-08 DIAGNOSIS — F17210 Nicotine dependence, cigarettes, uncomplicated: Secondary | ICD-10-CM | POA: Diagnosis present

## 2017-08-08 DIAGNOSIS — Z79899 Other long term (current) drug therapy: Secondary | ICD-10-CM | POA: Diagnosis not present

## 2017-08-08 DIAGNOSIS — Z981 Arthrodesis status: Secondary | ICD-10-CM | POA: Diagnosis not present

## 2017-08-08 DIAGNOSIS — I252 Old myocardial infarction: Secondary | ICD-10-CM | POA: Diagnosis not present

## 2017-08-08 DIAGNOSIS — Z791 Long term (current) use of non-steroidal anti-inflammatories (NSAID): Secondary | ICD-10-CM | POA: Diagnosis not present

## 2017-08-08 DIAGNOSIS — Z880 Allergy status to penicillin: Secondary | ICD-10-CM | POA: Diagnosis not present

## 2017-08-08 DIAGNOSIS — I2510 Atherosclerotic heart disease of native coronary artery without angina pectoris: Secondary | ICD-10-CM | POA: Diagnosis present

## 2017-08-08 LAB — BASIC METABOLIC PANEL
ANION GAP: 7 (ref 5–15)
BUN: 13 mg/dL (ref 6–20)
CALCIUM: 8.2 mg/dL — AB (ref 8.9–10.3)
CO2: 23 mmol/L (ref 22–32)
CREATININE: 0.77 mg/dL (ref 0.61–1.24)
Chloride: 105 mmol/L (ref 101–111)
GFR calc Af Amer: >60 mL/min (ref >60)
GFR calc non Af Amer: >60 mL/min (ref >60)
Glucose, Bld: 106 mg/dL — ABNORMAL HIGH (ref 65–99)
POTASSIUM: 3.3 mmol/L — AB (ref 3.5–5.1)
SODIUM: 135 mmol/L (ref 135–145)

## 2017-08-08 LAB — ACID FAST CULTURE WITH REFLEXED SENSITIVITIES (MYCOBACTERIA): Acid Fast Culture: NEGATIVE

## 2017-08-08 LAB — TROPONIN I
Troponin I: <0.03 ng/mL (ref <0.03)
Troponin I: <0.03 ng/mL (ref <0.03)

## 2017-08-08 MED ORDER — IBUPROFEN 200 MG PO TABS
600.0000 mg | ORAL_TABLET | Freq: Four times a day (QID) | ORAL | Status: DC | PRN
  Administered 2017-08-08 – 2017-08-09 (×2): 600 mg via ORAL
  Filled 2017-08-08 (×2): qty 3

## 2017-08-08 MED ORDER — NICOTINE 7 MG/24HR TD PT24
7.0000 mg | MEDICATED_PATCH | Freq: Every day | TRANSDERMAL | Status: DC
  Administered 2017-08-08 – 2017-08-09 (×2): 7 mg via TRANSDERMAL
  Filled 2017-08-08 (×2): qty 1

## 2017-08-08 MED ORDER — ONDANSETRON HCL 4 MG/2ML IJ SOLN
4.0000 mg | Freq: Four times a day (QID) | INTRAMUSCULAR | Status: DC | PRN
  Administered 2017-08-08: 4 mg via INTRAVENOUS
  Filled 2017-08-08: qty 2

## 2017-08-08 MED ORDER — SODIUM CHLORIDE 0.9 % IV SOLN
INTRAVENOUS | Status: DC
  Administered 2017-08-08 (×2): via INTRAVENOUS

## 2017-08-08 MED ORDER — ALUM & MAG HYDROXIDE-SIMETH 200-200-20 MG/5ML PO SUSP
30.0000 mL | Freq: Four times a day (QID) | ORAL | Status: DC | PRN
  Administered 2017-08-08 – 2017-08-09 (×2): 30 mL via ORAL
  Filled 2017-08-08 (×2): qty 30

## 2017-08-08 MED ORDER — ACETAMINOPHEN 325 MG PO TABS
650.0000 mg | ORAL_TABLET | Freq: Four times a day (QID) | ORAL | Status: DC | PRN
  Administered 2017-08-08 – 2017-08-09 (×2): 650 mg via ORAL
  Filled 2017-08-08 (×2): qty 2

## 2017-08-08 MED ORDER — ACETAMINOPHEN 650 MG RE SUPP: 650.0000 mg | Freq: Four times a day (QID) | RECTAL | Status: DC | PRN

## 2017-08-08 MED ORDER — ONDANSETRON HCL 4 MG PO TABS
4.0000 mg | ORAL_TABLET | Freq: Four times a day (QID) | ORAL | Status: DC | PRN
  Administered 2017-08-09: 4 mg via ORAL
  Filled 2017-08-08: qty 1

## 2017-08-08 MED ORDER — ADULT MULTIVITAMIN W/MINERALS CH
1.0000 | ORAL_TABLET | Freq: Every day | ORAL | Status: DC
  Administered 2017-08-08 – 2017-08-09 (×2): 1 via ORAL
  Filled 2017-08-08 (×2): qty 1

## 2017-08-08 MED ORDER — ENSURE ENLIVE PO LIQD
237.0000 mL | Freq: Two times a day (BID) | ORAL | Status: DC
  Administered 2017-08-09: 237 mL via ORAL

## 2017-08-08 MED ORDER — ENOXAPARIN SODIUM 40 MG/0.4ML ~~LOC~~ SOLN
40.0000 mg | SUBCUTANEOUS | Status: DC
  Administered 2017-08-08 – 2017-08-09 (×2): 40 mg via SUBCUTANEOUS
  Filled 2017-08-08 (×3): qty 0.4

## 2017-08-08 NOTE — Progress Notes (Signed)
Patient Name: Samuel Reyes, male   DOB: 09-10-1955, 62 y.o.  MRN: [id number]  Patient seen - stable medically. Troponins neg. CP likely due to HR and anxiety. Continues to have SI. Psych consult done - Dr Mariea Clonts notified of consult.  Truett Mainland, DO 08/08/2017 3:13 PM

## 2017-08-08 NOTE — Progress Notes (Signed)
Initial Nutrition Assessment  INTERVENTION:   -Continue Ensure Enlive po BID, each supplement provides 350 kcal and 20 grams of protein -Provide Multivitamin with minerals daily -RD will continue to monitor  NUTRITION DIAGNOSIS:   Increased nutrient needs related to (ETOH abuse) as evidenced by estimated needs.  GOAL:   Patient will meet greater than or equal to 90% of their needs  MONITOR:   PO intake, Supplement acceptance, Weight trends, Labs, I & O's  REASON FOR ASSESSMENT:   Malnutrition Screening Tool    ASSESSMENT:   62 y.o. male with medical history significant of EtOH abuse, COPD, HTN, homeless.  Patient presents to ED with c/o feeling suicidal.  Says he is homeless, lonely, and hopeless.  Drinks EtOH every day.  Shaky because he hasnt had anything to drink today.  Also reports substernal and epigastric pain.  Pt homeless, lives in a shelter. Pt with fair appetite. PTA was drinking 3-4 40oz beers daily. Has been ordered Ensure supplements, not drinking them at this time. Pt ate 50% of his breakfast which still provided ~500 kcal and 16g protein. Will continue Ensure supplement order for now and add daily MVI.  Per chart review, pt's weight has increased since November 2018.  Medications: Thiamine tablet daily Labs reviewed: Low K   NUTRITION - FOCUSED PHYSICAL EXAM:    Most Recent Value  Orbital Region  No depletion  Upper Arm Region  No depletion  Thoracic and Lumbar Region  Unable to assess  Buccal Region  No depletion  Temple Region  Moderate depletion  Clavicle Bone Region  Moderate depletion  Clavicle and Acromion Bone Region  Moderate depletion  Scapular Bone Region  Unable to assess  Dorsal Hand  No depletion  Patellar Region  Unable to assess  Anterior Thigh Region  Unable to assess  Posterior Calf Region  Unable to assess  Edema (RD Assessment)  None       Diet Order:  Diet regular Room service appropriate? Yes; Fluid consistency:  Thin  EDUCATION NEEDS:   No education needs have been identified at this time  Skin:  Skin Assessment: Reviewed RN Assessment  Last BM:  1/9  Height:   Ht Readings from Last 1 Encounters:  08/07/17 5\' 5"  (1.651 m)    Weight:   Wt Readings from Last 1 Encounters:  08/07/17 151 lb (68.5 kg)    Ideal Body Weight:  61.8 kg  BMI:  Body mass index is 25.13 kg/m.  Estimated Nutritional Needs:   Kcal:  2000-2200  Protein:  95-105g  Fluid:  2L/day  Clayton Bibles, MS, RD, LDN [place] Dietitian Pager: [phone] After Hours Pager: [phone]

## 2017-08-08 NOTE — H&P (Signed)
History and Physical    Antoinette Haskett YIR:[id number] DOB: 09-23-1955 DOA: 08/07/2017  PCP: Arnoldo Morale, MD  Patient coming from: Home  I have personally briefly reviewed patient's old medical records in [place]  Chief Complaint: Suicidal, CP, EtOH withdrawal  HPI: [place] is a 62 y.o. male with medical history significant of EtOH abuse, COPD, HTN, homeless.  Patient presents to ED with c/o feeling suicidal.  Says he is homeless, lonely, and hopeless.  Drinks EtOH every day.  Shaky because he hasnt had anything to drink today.  Also reports substernal and epigastric pain.   ED Course: Trop neg, initial EtOH 230 at 10am on 1/9.  He is put on CIWA and TTS consulted.  While holding in ED later in the evening he becomes increasingly tachycardic despite ativan.  Ultimately controlled with ativan and lopressor.  Hospitalist asked to admit for worsening DTs.   Review of Systems: As per HPI otherwise 10 point review of systems negative.   Past Medical History:  Diagnosis Date  . Alcoholism ([place])   . CAD (coronary artery disease) 2002   MI, no intervention required  . COPD (chronic obstructive pulmonary disease) (HCC)    not on home O2  . Depression   . GERD (gastroesophageal reflux disease)   . Headache   . Hypertension   . Stroke [facility]) 2006    Past Surgical History:  Procedure Laterality Date  . BACK SURGERY     3 cervical spine surgeries C4-C5 fused  . COLONOSCOPY N/A 01/04/2014   Procedure: COLONOSCOPY;  Surgeon: Danie Binder, MD;  Location: [facility];  Service: Endoscopy;  Laterality: N/A;  1:45  . ESOPHAGOGASTRODUODENOSCOPY N/A 01/04/2014   Procedure: ESOPHAGOGASTRODUODENOSCOPY (EGD);  Surgeon: Danie Binder, MD;  Location: [facility];  Service: Endoscopy;  Laterality: N/A;  . FINGER SURGERY Left    2nd, 3rd, & 4th fingers were cut off by table saw and reattached  . GASTRECTOMY    . HERNIA REPAIR    . INCISIONAL HERNIA REPAIR N/A 01/20/2014   Procedure: LAPAROSCOPIC RECURRENT  INCISIONAL HERNIA with mesh;  Surgeon: Edward Jolly, MD;  Location: [facility] ORS;  Service: General;  Laterality: N/A;  . rt knee arthroscopic surgery    . SHOULDER SURGERY Bilateral    3 surgeries on on left, 2 surgeries on right      reports that he has been smoking cigarettes.  He started smoking about 51 years ago. He has a 104.00 pack-year smoking history. he has never used smokeless tobacco. He reports that he drinks about 1.8 - 2.4 oz of alcohol per week. He reports that he does not use drugs.  Allergies  Allergen Reactions  . Bee Venom Anaphylaxis  . Penicillins Rash    Has patient had a PCN reaction causing immediate rash, facial/tongue/throat swelling, SOB or lightheadedness with hypotension: {Yes Has patient had a PCN reaction causing severe rash involving mucus membranes or skin necrosis: YES Has patient had a PCN reaction that required hospitalization Yes Has patient had a PCN reaction occurring within the last 10 years: YES If all of the above answers are "NO", then may proceed with Cephalosporin use.     Family History  Problem Relation Age of Onset  . Cancer Father        bone  . Cancer Brother        lungs  . Stroke Maternal Grandmother   . Asthma Son        died at  age 38 in his sleep   . Spina bifida Son        died at age 68   . Dementia Mother   . Colon cancer Neg Hx      Prior to Admission medications   Medication Sig Start Date End Date Taking? Authorizing Provider  acetaminophen (TYLENOL) 500 MG tablet Take 1,000 mg by mouth every 6 (six) hours as needed for mild pain, moderate pain, fever or headache.   Yes [provider]  albuterol (PROVENTIL HFA;VENTOLIN HFA) 108 (90 Base) MCG/ACT inhaler Inhale 1-2 puffs into the lungs every 4 (four) hours as needed for wheezing. 08/29/16  Yes Arnoldo Morale, MD  buPROPion (WELLBUTRIN SR) 200 MG 12 hr tablet Take 200 mg by mouth at bedtime. 07/26/17  Yes [provider]  metoprolol (LOPRESSOR) 50 MG tablet Take 1 tablet (50 mg total) by mouth 2 (two) times daily. 09/03/16  Yes Arnoldo Morale, MD  Multiple Vitamin (MULTIVITAMIN WITH MINERALS) TABS tablet Take 1 tablet by mouth daily. 07/02/17  Yes Barton Dubois, MD  predniSONE (DELTASONE) 20 MG tablet Take 2 tablets (40 mg total) by mouth daily. 07/30/17  Yes Patrecia Pour, MD  SPIRIVA HANDIHALER 18 MCG inhalation capsule Place 1 puff into inhaler and inhale daily. 07/03/17  Yes [provider]  traZODone (DESYREL) 100 MG tablet Take 1 tablet (100 mg total) by mouth at bedtime. 07/01/17  Yes Barton Dubois, MD  meloxicam (MOBIC) 7.5 MG tablet Take 1 tablet (7.5 mg total) by mouth daily. 07/16/17   Arnoldo Morale, MD    Physical Exam: Vitals:   08/07/17 2300 08/08/17 0000 08/08/17 0020 08/08/17 0030  BP: 126/77 119/68 119/68 113/68  Pulse: (!) 121 (!) 110 (!) 103 100  Resp: 18 (!) 23  (!) 22  Temp: 99 F (37.2 C)     TempSrc: Oral     SpO2: 94% 95%  95%  Weight:      Height:        Constitutional: NAD, calm, comfortable Eyes: PERRL, lids and conjunctivae normal ENMT: Mucous membranes are moist. Posterior pharynx clear of any exudate or lesions.Normal dentition.  Neck: normal, supple, no masses, no thyromegaly Respiratory: clear to auscultation bilaterally, no wheezing, no crackles. Normal respiratory effort. No accessory muscle use.  Cardiovascular: Regular rate and rhythm, no murmurs / rubs / gallops. No extremity edema. 2+ pedal pulses. No carotid bruits.  Abdomen: no tenderness, no masses palpated. No hepatosplenomegaly. Bowel sounds positive.  Musculoskeletal: no clubbing / cyanosis. No joint deformity upper and lower extremities. Good ROM, no contractures. Normal muscle tone.  Skin: no rashes, lesions, ulcers. No induration Neurologic: CN 2-12 grossly intact. Sensation intact, DTR normal. Strength 5/5 in all 4.  Psychiatric: Normal judgment and insight. Alert and oriented x 3.  Normal mood.    Labs on Admission: I have personally reviewed following labs and imaging studies  CBC: Recent Labs  Lab 08/07/17 1040  WBC 6.1  HGB 13.4  HCT 42.1  MCV 76.4*  PLT 485   Basic Metabolic Panel: Recent Labs  Lab 08/07/17 1040  NA 139  K 4.2  CL 105  CO2 22  GLUCOSE 87  BUN 8  CREATININE 0.60*  CALCIUM 8.6*   GFR: Estimated Creatinine Clearance: 83.3 mL/min (A) (by C-G formula based on SCr of 0.6 mg/dL (L)). Liver Function Tests: Recent Labs  Lab 08/07/17 1040  AST 31  ALT 21  ALKPHOS 86  BILITOT 0.3  PROT 7.7  ALBUMIN 4.0  No results for input(s): LIPASE, AMYLASE in the last 168 hours. No results for input(s): AMMONIA in the last 168 hours. Coagulation Profile: Recent Labs  Lab 08/07/17 1142  INR 0.90   Cardiac Enzymes: Recent Labs  Lab 08/07/17 1015  TROPONINI <0.03   BNP (last 3 results) No results for input(s): PROBNP in the last 8760 hours. HbA1C: No results for input(s): HGBA1C in the last 72 hours. CBG: No results for input(s): GLUCAP in the last 168 hours. Lipid Profile: No results for input(s): CHOL, HDL, LDLCALC, TRIG, CHOLHDL, LDLDIRECT in the last 72 hours. Thyroid Function Tests: No results for input(s): TSH, T4TOTAL, FREET4, T3FREE, THYROIDAB in the last 72 hours. Anemia Panel: No results for input(s): VITAMINB12, FOLATE, FERRITIN, TIBC, IRON, RETICCTPCT in the last 72 hours. Urine analysis:    Component Value Date/Time   COLORURINE STRAW (A) 06/24/2017 1857   APPEARANCEUR CLEAR 06/24/2017 1857   LABSPEC 1.001 (L) 06/24/2017 1857   PHURINE 6.0 06/24/2017 1857   GLUCOSEU NEGATIVE 06/24/2017 1857   HGBUR NEGATIVE 06/24/2017 1857   BILIRUBINUR NEGATIVE 06/24/2017 1857   KETONESUR NEGATIVE 06/24/2017 1857   PROTEINUR NEGATIVE 06/24/2017 1857   UROBILINOGEN 0.2 05/31/2015 2156   NITRITE NEGATIVE 06/24/2017 1857   LEUKOCYTESUR NEGATIVE 06/24/2017 1857    Radiological Exams on Admission: Dg Chest Portable 1  View  Result Date: 08/08/2017 CLINICAL DATA:  62 year old male with chest pain and cough. EXAM: PORTABLE CHEST 1 VIEW COMPARISON:  Chest radiograph dated 07/29/2017 FINDINGS: The lungs are clear. There is no pleural effusion or pneumothorax. The cardiac silhouette is within normal limits. No acute osseous pathology. IMPRESSION: No active disease. Electronically Signed   By: Anner Crete M.D.   On: 08/08/2017 00:18    EKG: Independently reviewed.  Assessment/Plan Principal Problem:   Alcohol withdrawal (HCC) Active Problems:   Suicidal ideation   Chest pain    1. EtOH withdrawal - 1. Tachycardia improved after ativan and lopressor 2. Will put on CIWA 3. Admit for obs due to tachycardia in setting of withdrawal 4. Currently sleeping after ativan in ED. 2. SI - TTS, psych eval in AM 3. CP - 1. Serial trops 2. Tele monitor 3. Suspect HR related though  DVT prophylaxis: Lovenox Code Status: Full Family Communication: No family in room Disposition Plan: TBD Consults called: TTS called by EDP Admission status: Place in obs   [place], [place] [facility] Pager [phone]  If 7AM-7PM, please contact day team taking care of patient [email or web address] Password [email or web address]  08/08/2017, 1:08 AM

## 2017-08-08 NOTE — Congregational Nurse Program (Signed)
Congregational Nurse Program Note  Date of Encounter: 07/16/2017  Past Medical History: Past Medical History:  Diagnosis Date  . Alcoholism ([place])   . CAD (coronary artery disease) 2002   MI, no intervention required  . COPD (chronic obstructive pulmonary disease) (HCC)    not on home O2  . Depression   . GERD (gastroesophageal reflux disease)   . Headache   . Hypertension   . Stroke [facility]) 2006    Encounter Details: CNP Questionnaire - 07/29/17 1330      Questionnaire   Patient Status  Not Applicable    Race  White or Caucasian    Location Patient [place]  Medicaid    Uninsured  Not Applicable    Food  Within past 12 months, worried food would run out with no money to buy more    Housing/Utilities  No permanent housing    Transportation  Yes, need transportation assistance;Provided transportation assistance (bus pass, taxi voucher, etc.)    Interpersonal Safety  No, do not feel physically and emotionally safe where you currently live    Medication  Yes, have medication insecurities    Medical Provider  Yes    Referrals  Emergency Department    ED Visit Averted  Not Applicable    Life-Saving Intervention Made  Yes      Clinical Intake - 07/16/17 1418      Pre-visit preparation   Pre-visit preparation completed  Yes      Pain   Pain   0-10    Pain Score  8     Pain Location  Head      Nutrition Screen   Diabetes  No      Functional Status   Activities of Daily Living  Independent    Ambulation  Independent    Medication Administration  Independent    Home Management  Independent      Abuse/Neglect   Do you feel unsafe in your current relationship?  No    Do you feel physically threatened by others?  No    Anyone hurting you at home, work, or school?  No    Unable to ask?  No      Investment banker, operational Needed?  No     CN spoke with Etter Sjogren RN CM at [facility] while client was in office. CN will provide taxi voucher for client  to appointment and [facility] will provide return taxi voucher. Client agrees to plan.

## 2017-08-08 NOTE — Progress Notes (Signed)
Patient refusing telemetry

## 2017-08-08 NOTE — Congregational Nurse Program (Signed)
Congregational Nurse Program Note  Date of Encounter: 07/15/2017  Past Medical History: Past Medical History:  Diagnosis Date  . Alcoholism ([place])   . CAD (coronary artery disease) 2002   MI, no intervention required  . COPD (chronic obstructive pulmonary disease) (HCC)    not on home O2  . Depression   . GERD (gastroesophageal reflux disease)   . Headache   . Hypertension   . Stroke [facility]) 2006    Encounter Details: CNP Questionnaire - 07/29/17 1330      Questionnaire   Patient Status  Not Applicable    Race  White or Caucasian    Location Patient [place]  Medicaid    Uninsured  Not Applicable    Food  Within past 12 months, worried food would run out with no money to buy more    Housing/Utilities  No permanent housing    Transportation  Yes, need transportation assistance;Provided transportation assistance (bus pass, taxi voucher, etc.)    Interpersonal Safety  No, do not feel physically and emotionally safe where you currently live    Medication  Yes, have medication insecurities    Medical Provider  Yes    Referrals  Emergency Department    ED Visit Averted  Not Applicable    Life-Saving Intervention Made  Yes      Clinical Intake - 07/16/17 1418      Pre-visit preparation   Pre-visit preparation completed  Yes      Pain   Pain   0-10    Pain Score  8     Pain Location  Head      Nutrition Screen   Diabetes  No      Functional Status   Activities of Daily Living  Independent    Ambulation  Independent    Medication Administration  Independent    Home Management  Independent      Abuse/Neglect   Do you feel unsafe in your current relationship?  No    Do you feel physically threatened by others?  No    Anyone hurting you at home, work, or school?  No    Unable to ask?  No      Investment banker, operational Needed?  No     BP check, wellness check. Client states continued feelings of worthlessness, "I just want to go out and get  hit by a car". CN encouraged client to make appointment at [facility] to pick up [facility] medication. CN also recommended client see CN C Evans at [facility] if he needed extra support. CN provided bus passes to get to appointment at [facility] tomorrow. Client states he will keep appointment.

## 2017-08-08 NOTE — Care Management Note (Signed)
Case Management Note  Patient Details  Name: Samuel Reyes MRN: [id number] Date of Birth: 1956/03/13  Subjective/Objective:                  suicidal poss bhh admission.  Action/Plan: Date:  August 08, 2017 Chart reviewed for concurrent status and case management needs.  Will continue to follow patient progress.  Discharge Planning: following for needs.  None present at this time of review. Expected discharge date: August 11 2017 Velva Harman, BSN, [place], [place]  Expected Discharge Date:                  Expected Discharge Plan:  [facility]  In-House Referral:  Clinical Social Work  Discharge planning Services  CM Consult  Post Acute Care Choice:    Choice offered to:     DME Arranged:    DME Agency:     HH Arranged:    [place] Agency:     Status of Service:  In process, will continue to follow  If discussed at Long Length of Stay Meetings, dates discussed:    Additional Comments:  Leeroy Cha, RN 08/08/2017, 9:39 AM

## 2017-08-09 ENCOUNTER — Telehealth: Payer: Self-pay | Admitting: Family Medicine

## 2017-08-09 DIAGNOSIS — R079 Chest pain, unspecified: Secondary | ICD-10-CM

## 2017-08-09 DIAGNOSIS — F1010 Alcohol abuse, uncomplicated: Secondary | ICD-10-CM

## 2017-08-09 DIAGNOSIS — F39 Unspecified mood [affective] disorder: Secondary | ICD-10-CM

## 2017-08-09 DIAGNOSIS — F17210 Nicotine dependence, cigarettes, uncomplicated: Secondary | ICD-10-CM

## 2017-08-09 DIAGNOSIS — Z590 Homelessness: Secondary | ICD-10-CM

## 2017-08-09 DIAGNOSIS — F10239 Alcohol dependence with withdrawal, unspecified: Secondary | ICD-10-CM

## 2017-08-09 DIAGNOSIS — Z810 Family history of intellectual disabilities: Secondary | ICD-10-CM

## 2017-08-09 DIAGNOSIS — R1013 Epigastric pain: Secondary | ICD-10-CM

## 2017-08-09 DIAGNOSIS — R45851 Suicidal ideations: Secondary | ICD-10-CM

## 2017-08-09 NOTE — Consult Note (Signed)
[place] Psychiatry Consult   Reason for Consult:  SI Referring Physician:  Dr. Nehemiah Settle Patient Identification: Samuel Reyes MRN:  [id number] Principal Diagnosis: Alcohol withdrawal [facility]) Diagnosis:   Patient Active Problem List   Diagnosis Date Noted  . Alcohol withdrawal ([place]) [F10.239] 08/08/2017  . COPD with acute bronchitis ([place]) [J44.0, J20.9] 07/29/2017  . Alcohol use [Z78.9]   . Homelessness [Z59.0] 06/24/2017  . Lung nodule [R91.1] 09/03/2016  . Tobacco abuse [Z72.0] 09/03/2016  . Insomnia [G47.00] 09/03/2016  . HCAP (healthcare-associated pneumonia) [J18.9] 08/22/2016  . Bronchiolitis [J21.9] 08/21/2016  . Malnutrition of moderate degree [E44.0] 08/04/2016  . Atrial tachycardia ([place]) [I47.1] 08/02/2016  . Impaired glucose tolerance [R73.02] 08/02/2016  . Alcohol-induced mood disorder ([place]) [F10.94] 12/12/2015  . Syncope [R55] 11/15/2015  . Chest pain [R07.9] 11/15/2015  . Nausea vomiting and diarrhea [R11.2, R19.7] 11/15/2015  . Abdominal pain [R10.9] 11/15/2015  . Major depressive disorder, recurrent episode, moderate with anxious distress ([place]) [F33.1] 10/30/2015  . Alcohol use disorder, severe, dependence ([place]) [F10.20] 10/29/2015  . COPD exacerbation ([place]) [J44.1] 09/20/2015  . Acute respiratory failure with hypoxia ([place]) [J96.01] 09/18/2015  . Suicidal ideation [R45.851] 09/17/2015  . Alcohol intoxication ([place]) [F10.929] 09/17/2015  . Depression [F32.9] 09/17/2015  . Benign essential HTN [I10] 09/17/2015  . Hypokalemia [E87.6] 09/17/2015  . Hyponatremia [E87.1] 09/17/2015  . Coffee ground emesis [K92.0] 09/17/2015  . COPD (chronic obstructive pulmonary disease) ([place]) [J44.9] 02/17/2013  . GERD (gastroesophageal reflux disease) [K21.9] 02/17/2013    Total Time spent with patient: 20 minutes  Subjective:   Samuel Reyes is a 62 y.o. male patient admitted with ETOH abuse with SI.   HPI:  Samuel Reyes is a 62 y.o. male with medical history  significant of EtOH abuse, COPD, HTN, homeless.  Patient presents to ED with c/o feeling suicidal.  Says he is homeless, lonely, and hopeless.  Drinks EtOH every day.  Shaky because he hasnt had anything to drink today.  Also reports substernal and epigastric pain. Patient was admitted to inpatient for treatment continued reporting SI.  Upon questioning patient, he states that he wants to get back to [employer] where he was a. "If I can get back there then I'm not suicidal." He denies any HI/AVH. Will recommend restarting his Wellbutrin SR 200 mg Q12H, which he states helpd him in the past. Patient is known to have a history of non-compliance with medications and treatments. CSW will assist in patient getting back to [employer]. Dr. Nehemiah Settle notified of plan.  Past Psychiatric History: Alcohol abuse, Depression, anxiety, hx of previous visits to ED for SI  Risk to Self: Suicidal Ideation: Yes-Currently Present Suicidal Intent: Yes-Currently Present Is patient at risk for suicide?: Yes Suicidal Plan?: Yes-Currently Present Specify Current Suicidal Plan: walk in front of a car Access to Means: Yes Triggers for Past Attempts: Unknown Intentional Self Injurious Behavior: None Risk to Others: Homicidal Ideation: No Thoughts of Harm to Others: No Current Homicidal Intent: No Current Homicidal Plan: No Access to Homicidal Means: No History of harm to others?: No Assessment of Violence: None Noted Does patient have access to weapons?: No Criminal Charges Pending?: No Does patient have a court date: No Prior Inpatient Therapy: Prior Inpatient Therapy: Yes Prior Therapy Dates: multiple (last time 04/2017) Prior Therapy Facilty/Provider(s): multiple (last time was at [facility]) Reason for Treatment: depression Prior Outpatient Therapy: Prior Outpatient Therapy: Yes Prior Therapy Facilty/Provider(s): [facility] Reason for Treatment: depression Does patient have an ACCT team?:  No Does  patient have Intensive In-House Services?  : No Does patient have [facility] services? : Unknown Does patient have P4CC services?: No  Past Medical History:  Past Medical History:  Diagnosis Date  . Alcoholism ([place])   . CAD (coronary artery disease) 2002   MI, no intervention required  . COPD (chronic obstructive pulmonary disease) (HCC)    not on home O2  . Depression   . GERD (gastroesophageal reflux disease)   . Headache   . Hypertension   . Stroke [facility]) 2006    Past Surgical History:  Procedure Laterality Date  . BACK SURGERY     3 cervical spine surgeries C4-C5 fused  . COLONOSCOPY N/A 01/04/2014   Procedure: COLONOSCOPY;  Surgeon: Danie Binder, MD;  Location[facility];  Service: Endoscopy;  Laterality: N/A;  1:45  . ESOPHAGOGASTRODUODENOSCOPY N/A 01/04/2014   Procedure: ESOPHAGOGASTRODUODENOSCOPY (EGD);  Surgeon: Danie Binder, MD;  Location: [facility];  Service: Endoscopy;  Laterality: N/A;  . FINGER SURGERY Left    2nd, 3rd, & 4th fingers were cut off by table saw and reattached  . GASTRECTOMY    . HERNIA REPAIR    . INCISIONAL HERNIA REPAIR N/A 01/20/2014   Procedure: LAPAROSCOPIC RECURRENT  INCISIONAL HERNIA with mesh;  Surgeon: Edward Jolly, MD;  Location: [facility] ORS;  Service: General;  Laterality: N/A;  . rt knee arthroscopic surgery    . SHOULDER SURGERY Bilateral    3 surgeries on on left, 2 surgeries on right    Family History:  Family History  Problem Relation Age of Onset  . Cancer Father        bone  . Cancer Brother        lungs  . Stroke Maternal Grandmother   . Asthma Son        died at age 45 in his sleep   . Spina bifida Son        died at age 8   . Dementia Mother   . Colon cancer Neg Hx    Family Psychiatric  History: Denies Social History:  Social History   Substance and Sexual Activity  Alcohol Use Yes  . Alcohol/week: 1.8 - 2.4 oz  . Types: 3 - 4 Cans of beer per week   Comment: everyday- 3-4 40 oz     Social  History   Substance and Sexual Activity  Drug Use No   Comment: denied using any drugs    Social History   Socioeconomic History  . Marital status: Widowed    Spouse name: None  . Number of children: None  . Years of education: None  . Highest education level: None  Social Needs  . Financial resource strain: None  . Food insecurity - worry: None  . Food insecurity - inability: None  . Transportation needs - medical: None  . Transportation needs - non-medical: None  Occupational History  . Occupation: Disability  Tobacco Use  . Smoking status: Current Every Day Smoker    Packs/day: 2.00    Years: 52.00    Pack years: 104.00    Types: Cigarettes    Start date: 07/30/1966  . Smokeless tobacco: Never Used  . Tobacco comment: down to 1/4 ppd  Substance and Sexual Activity  . Alcohol use: Yes    Alcohol/week: 1.8 - 2.4 oz    Types: 3 - 4 Cans of beer per week    Comment: everyday- 3-4 40 oz  . Drug use: No  Comment: denied using any drugs  . Sexual activity: No  Other Topics Concern  . None  Social History Narrative  . None   Additional Social History:    Allergies:   Allergies  Allergen Reactions  . Bee Venom Anaphylaxis  . Penicillins Rash    Has patient had a PCN reaction causing immediate rash, facial/tongue/throat swelling, SOB or lightheadedness with hypotension: {Yes Has patient had a PCN reaction causing severe rash involving mucus membranes or skin necrosis: YES Has patient had a PCN reaction that required hospitalization Yes Has patient had a PCN reaction occurring within the last 10 years: YES If all of the above answers are "NO", then may proceed with Cephalosporin use.     Labs:  Results for orders placed or performed during the hospital encounter of 08/07/17 (from the past 48 hour(s))  I-stat troponin, ED     Status: None   Collection Time: 08/07/17  8:31 PM  Result Value Ref Range   Troponin i, poc 0.01 0.00 - 0.08 ng/mL   Comment 3             Comment: Due to the release kinetics of cTnI, a negative result within the first hours of the onset of symptoms does not rule out myocardial infarction with certainty. If myocardial infarction is still suspected, repeat the test at appropriate intervals.   Rapid urine drug screen (hospital performed)     Status: Abnormal   Collection Time: 08/07/17  9:05 PM  Result Value Ref Range   Opiates NONE DETECTED NONE DETECTED   Cocaine NONE DETECTED NONE DETECTED   Benzodiazepines POSITIVE (A) NONE DETECTED   Amphetamines NONE DETECTED NONE DETECTED   Tetrahydrocannabinol NONE DETECTED NONE DETECTED   Barbiturates NONE DETECTED NONE DETECTED    Comment: (NOTE) DRUG SCREEN FOR MEDICAL PURPOSES ONLY.  IF CONFIRMATION IS NEEDED FOR ANY PURPOSE, NOTIFY LAB WITHIN 5 DAYS. LOWEST DETECTABLE LIMITS FOR URINE DRUG SCREEN Drug Class                     Cutoff (ng/mL) Amphetamine and metabolites    1000 Barbiturate and metabolites    200 Benzodiazepine                 350 Tricyclics and metabolites     300 Opiates and metabolites        300 Cocaine and metabolites        300 THC                            50   Basic metabolic panel     Status: Abnormal   Collection Time: 08/08/17  6:51 AM  Result Value Ref Range   Sodium 135 135 - 145 mmol/L   Potassium 3.3 (L) 3.5 - 5.1 mmol/L    Comment: DELTA CHECK NOTED REPEATED TO VERIFY    Chloride 105 101 - 111 mmol/L   CO2 23 22 - 32 mmol/L   Glucose, Bld 106 (H) 65 - 99 mg/dL   BUN 13 6 - 20 mg/dL   Creatinine, Ser 0.77 0.61 - 1.24 mg/dL   Calcium 8.2 (L) 8.9 - 10.3 mg/dL   GFR calc non Af Amer >60 >60 mL/min   GFR calc Af Amer >60 >60 mL/min    Comment: (NOTE) The eGFR has been calculated using the CKD EPI equation. This calculation has not been validated in all clinical situations. eGFR's persistently <60 mL/min  signify possible Chronic Kidney Disease.    Anion gap 7 5 - 15  Troponin I (q 6hr x 3)     Status: None   Collection  Time: 08/08/17  6:51 AM  Result Value Ref Range   Troponin I <0.03 <0.03 ng/mL  Troponin I (q 6hr x 3)     Status: None   Collection Time: 08/08/17 12:49 PM  Result Value Ref Range   Troponin I <0.03 <0.03 ng/mL    Current Facility-Administered Medications  Medication Dose Route Frequency Provider Last Rate Last Dose  . 0.9 %  sodium chloride infusion   Intravenous Continuous Etta Quill, DO   Stopped at 08/09/17 1425  . acetaminophen (TYLENOL) tablet 650 mg  650 mg Oral Q6H PRN Etta Quill, DO   650 mg at 08/09/17 4917   Or  . acetaminophen (TYLENOL) suppository 650 mg  650 mg Rectal Q6H PRN Etta Quill, DO      . alum & mag hydroxide-simeth (MAALOX/MYLANTA) 200-200-20 MG/5ML suspension 30 mL  30 mL Oral Q6H PRN Lolita Rieger, RN   30 mL at 08/09/17 0734  . enoxaparin (LOVENOX) injection 40 mg  40 mg Subcutaneous Q24H Jennette Kettle M, DO   40 mg at 08/09/17 9150  . feeding supplement (ENSURE ENLIVE) (ENSURE ENLIVE) liquid 237 mL  237 mL Oral BID BM Truett Mainland, DO   237 mL at 08/09/17 0920  . ibuprofen (ADVIL,MOTRIN) tablet 600 mg  600 mg Oral Q6H PRN Truett Mainland, DO   600 mg at 08/09/17 0346  . LORazepam (ATIVAN) injection 0-4 mg  0-4 mg Intravenous Q12H Fransico Meadow, PA-C       Or  . LORazepam (ATIVAN) tablet 0-4 mg  0-4 mg Oral Q12H Sofia, Leslie K, [place]      . metoprolol tartrate (LOPRESSOR) tablet 50 mg  50 mg Oral BID Ward, Ozella Almond, PA-C   50 mg at 08/09/17 5697  . multivitamin with minerals tablet 1 tablet  1 tablet Oral Daily Truett Mainland, DO   1 tablet at 08/09/17 0920  . nicotine (NICODERM CQ - dosed in mg/24 hr) patch 7 mg  7 mg Transdermal Daily Truett Mainland, DO   7 mg at 08/09/17 9480  . ondansetron (ZOFRAN) tablet 4 mg  4 mg Oral Q6H PRN Etta Quill, DO   4 mg at 08/09/17 0919   Or  . ondansetron [facility]) injection 4 mg  4 mg Intravenous Q6H PRN Etta Quill, DO   4 mg at 08/08/17 2028  . thiamine (VITAMIN B-1) tablet  100 mg  100 mg Oral Daily Caryl Ada K, [place]   100 mg at 08/09/17 1655   Or  . thiamine (B-1) injection 100 mg  100 mg Intravenous Daily Fransico Meadow, [place]        Musculoskeletal: Strength & Muscle Tone: within normal limits Gait & Station: normal Patient leans: N/A  Psychiatric Specialty Exam: Physical Exam  ROS  Blood pressure 133/78, pulse 76, temperature 97.8 F (36.6 C), temperature source Oral, resp. rate (!) 21, height 5' 5" (1.651 m), weight 68.5 kg (151 lb), SpO2 98 %.Body mass index is 25.13 kg/m.  General Appearance: Disheveled  Eye Contact:  Good  Speech:  Clear and Coherent and Normal Rate  Volume:  Normal  Mood:  Euthymic  Affect:  Congruent  Thought Process:  Goal Directed and Descriptions of Associations: Intact  Orientation:  Full (Time, Place, and Person)  Thought Content:  WDL  Suicidal Thoughts:  No  Homicidal Thoughts:  No  Memory:  Immediate;   Good Recent;   Good Remote;   Good  Judgement:  Good  Insight:  Good  Psychomotor Activity:  Normal  Concentration:  Concentration: Good and Attention Span: Good  Recall:  Good  Fund of Knowledge:  Good  Language:  Good  Akathisia:  No  Handed:  Right  AIMS (if indicated):     Assets:  Communication Skills Desire for Improvement Financial Resources/Insurance Housing Physical Health Social Support Transportation  ADL's:  Intact  Cognition:  WNL  Sleep:        Treatment Plan Summary: Plan is to:  Recommend restarting Wellbutrin SR 200 mg Q12H for mood control CSW to assist with returning to [employer] Follow up with outpatient providers  Disposition: No evidence of imminent risk to self or others at present.   Patient does not meet criteria for psychiatric inpatient admission. Supportive therapy provided about ongoing stressors. Discussed crisis plan, support from social network, calling 911, coming to the Emergency Department, and calling Suicide Hotline.  [place],  FNP 08/09/2017 2:29 PM

## 2017-08-09 NOTE — Discharge Instructions (Signed)

## 2017-08-09 NOTE — Progress Notes (Signed)
Patient denies suicidial thoughts Discharge instructions and medications dicussed with patient.  AVS given to patient.  All questions answered.

## 2017-08-09 NOTE — Progress Notes (Signed)
Patient still refusing to wear telemetry. Dr. Nehemiah Settle notified.  Will d/c telemetry order per MD order.  CMT notified.

## 2017-08-09 NOTE — Telephone Encounter (Signed)
Met with patient today while he was in the hospital. Patient informed me that he was upset because someone had lost his personal belongings and he can't leave the hospital without clothes. Informed patient that I would speak with charge nurse to ask about that matter. Aside from this, patient stated he was "alright". I informed patient that I had tried to reach him a couple of days ago to check on his status but was informed that he hadn't been at the [facility]. Patient stated that his sister had picked him up and taken him to [facility] where she lives for two days to celebrate his mom's birthday. Patient expressed that he would be going to the [facility] following his discharge today. He asked to have some bus passes to get there.   I spoke with Novella Rob, charge nurse, and she informed that they were going to give patients buss passes. Novella Rob was trying to reach the ED charge nurse to check on patient's belongings. Informed patient of my conversation with Novella Rob and scheduled him a hospital f/u appointment at [facility] on Friday 08/16/17 @ 2:00pm.   [place], congregational nurse, had reached out earlier in the day and asked that I contact Barnett Applebaum ([place] at the [facility]) and update her on patient. Call placed to Barnett Applebaum [phone] and informed her of my conversation with patient. Barnett Applebaum was appreciative and asked me to keep her updated after patient's visit next week. Barnett Applebaum also mentioned that she will try to help patient get a bed at the [facility] next week.   Call placed to [facility] and informed her that patient's follow up appointment had been scheduled for next week. Suanne Marker stated that she'll make sure that patient is informed and it's written on the discharge documents. Suanne Marker also mentioned that they were waiting on psych to evaluate patient.

## 2017-08-09 NOTE — Progress Notes (Signed)
Spiritual care delivered clothing, at request of nursing.  Provided brief support with pt, who voiced no further needs at time.  Note consult haplain will continue to follow for support related to homelessness and suicidal thoughts.

## 2017-08-09 NOTE — Progress Notes (Signed)
[id number]/tcf-Samuel Reyes at [facility]/worried about patient getting into the shelter over the weekend. She can advocate a bed for him but needs to know ahead of time if he is going to be dcd.  Telephone number is [phone]. Rhonda Davis,BSN,RN3,CCM

## 2017-08-09 NOTE — Progress Notes (Signed)
LCSW consulted for psych and homeless.  Patient cleared by psych.  Patient plans to return to [employer].   LCSW provided RN with bus pass for patient.   LCSW signing off.   Samuel Reyes

## 2017-08-09 NOTE — Discharge Summary (Signed)
Physician Discharge Summary  Patient ID: Samuel Reyes MRN: [id number] DOB/AGE: [id number] y.o.  Admit date: 08/07/2017 Discharge date: 08/09/2017  Admission Diagnoses:  Discharge Diagnoses:  Principal Problem:   Alcohol withdrawal ([place]) Active Problems:   Suicidal ideation   Chest pain   Discharged Condition: good  Hospital Course: Samuel Reyes is a 62 y.o. male with medical history significant of EtOH abuse, COPD, HTN, homeless.  Patient presents to ED with c/o feeling suicidal.  Says he is homeless, lonely, and hopeless.  Drinks EtOH every day.  Shaky because he hasnt had anything to drink today.  Also reports substernal and epigastric pain.  1. Chest Pain Serial troponins done, which were negative. Chest pain likely due to anxiety and tachycardia.   2. EtOH withdrawal Patient remained stable and without acute withdrawal symptoms  3. Suicide Ideation Psychiatry saw patient - low risk of harm as patient stated that if he were to return to [facility] that he would not be suicidal. Psychiatry cleared the patient for discharge.  Consults: psychiatry  Significant Diagnostic Studies: none  Treatments: IV hydration  Discharge Exam: Blood pressure 133/78, pulse 76, temperature 97.8 F (36.6 C), temperature source Oral, resp. rate (!) 21, height 5\' 5"  (1.651 m), weight 68.5 kg (151 lb), SpO2 98 %. General appearance: alert, cooperative and no distress Resp: clear to auscultation bilaterally Cardio: regular rate and rhythm, S1, S2 normal, no murmur, click, rub or gallop GI: soft, non-tender; bowel sounds normal; no masses,  no organomegaly Extremities: extremities normal, atraumatic, no cyanosis or edema Pulses: 2+ and symmetric  Disposition: 01-Home or Self Care  Discharge Instructions    Call MD for:  difficulty breathing, headache or visual disturbances   Complete by:  As directed    Call MD for:  extreme fatigue   Complete by:  As directed    Call MD for:   persistant nausea and vomiting   Complete by:  As directed    Call MD for:  severe uncontrolled pain   Complete by:  As directed    Call MD for:  temperature >100.4   Complete by:  As directed    Diet - low sodium heart healthy   Complete by:  As directed    Increase activity slowly   Complete by:  As directed      Allergies as of 08/09/2017      Reactions   Bee Venom Anaphylaxis   Penicillins Rash   Has patient had a PCN reaction causing immediate rash, facial/tongue/throat swelling, SOB or lightheadedness with hypotension: {Yes Has patient had a PCN reaction causing severe rash involving mucus membranes or skin necrosis: YES Has patient had a PCN reaction that required hospitalization Yes Has patient had a PCN reaction occurring within the last 10 years: YES If all of the above answers are "NO", then may proceed with Cephalosporin use.      Medication List    STOP taking these medications   predniSONE 20 MG tablet Commonly known as:  DELTASONE     TAKE these medications   acetaminophen 500 MG tablet Commonly known as:  TYLENOL Take 1,000 mg by mouth every 6 (six) hours as needed for mild pain, moderate pain, fever or headache.   albuterol 108 (90 Base) MCG/ACT inhaler Commonly known as:  PROVENTIL HFA;VENTOLIN HFA Inhale 1-2 puffs into the lungs every 4 (four) hours as needed for wheezing.   buPROPion 200 MG 12 hr tablet Commonly known as:  WELLBUTRIN SR Take 200 mg  by mouth at bedtime.   meloxicam 7.5 MG tablet Commonly known as:  MOBIC Take 1 tablet (7.5 mg total) by mouth daily.   metoprolol tartrate 50 MG tablet Commonly known as:  LOPRESSOR Take 1 tablet (50 mg total) by mouth 2 (two) times daily.   multivitamin with minerals Tabs tablet Take 1 tablet by mouth daily.   SPIRIVA HANDIHALER 18 MCG inhalation capsule Generic drug:  tiotropium Place 1 puff into inhaler and inhale daily.   traZODone 100 MG tablet Commonly known as:  DESYREL Take 1 tablet  (100 mg total) by mouth at bedtime.      Follow-up Information    Arnoldo Morale, MD Follow up in 1 week(s).   Specialty:  Family Medicine Contact information: [place] [address] [phone]          < 30 min spent on discharge.  Signed: Truett Mainland 08/09/2017, 2:01 PM

## 2017-08-11 NOTE — Congregational Nurse Program (Signed)
Congregational Nurse Program Note  Date of Encounter: 07/19/2017  Past Medical History: Past Medical History:  Diagnosis Date  . Alcoholism ([place])   . CAD (coronary artery disease) 2002   MI, no intervention required  . COPD (chronic obstructive pulmonary disease) (HCC)    not on home O2  . Depression   . GERD (gastroesophageal reflux disease)   . Headache   . Hypertension   . Stroke [facility]) 2006    Encounter Details: CNP Questionnaire - 07/29/17 1330      Questionnaire   Patient Status  Not Applicable    Race  White or Caucasian    Location Patient [place]  Medicaid    Uninsured  Not Applicable    Food  Within past 12 months, worried food would run out with no money to buy more    Housing/Utilities  No permanent housing    Transportation  Yes, need transportation assistance;Provided transportation assistance (bus pass, taxi voucher, etc.)    Interpersonal Safety  No, do not feel physically and emotionally safe where you currently live    Medication  Yes, have medication insecurities    Medical Provider  Yes    Referrals  Emergency Department    ED Visit Averted  Not Applicable    Life-Saving Intervention Made  Yes      Clinical Intake - 07/16/17 1418      Pre-visit preparation   Pre-visit preparation completed  Yes      Pain   Pain   0-10    Pain Score  8     Pain Location  Head      Nutrition Screen   Diabetes  No      Functional Status   Activities of Daily Living  Independent    Ambulation  Independent    Medication Administration  Independent    Home Management  Independent      Abuse/Neglect   Do you feel unsafe in your current relationship?  No    Do you feel physically threatened by others?  No    Anyone hurting you at home, work, or school?  No    Unable to ask?  No      Investment banker, operational Needed?  No     Follow up post [facility]  appointment. He states that he started medication for knee pain. States he went to  [place] to pick up his [place] medication. Next appointment is on 1/8. Follow up in CN clinic as needed.

## 2017-08-12 ENCOUNTER — Encounter: Payer: Self-pay | Admitting: Pediatric Intensive Care

## 2017-08-12 ENCOUNTER — Telehealth: Payer: Self-pay

## 2017-08-12 NOTE — Telephone Encounter (Signed)
[facility] Post-discharge Follow-Up Phone Call:  Date of Discharge:08/09/2017 Principal Discharge Diagnosis(es):alcohol withdrawal, suicidal ideation, chest pain Post-discharge Communication: (Clearly document all attempts clearly and date contact made) The patient does not have a phone.  this CM spoke to [place], [place] who confirmed that the patient has been at [facility] since discharge.  o problems reported at this time. She noted that he has all of his medications and he stated that he is taking them as ordered. She confirmed his appointment at [facility] on 08/16/17 @ 1400. [facility] to provide a cab voucher  for the patient to the [facility] and [facility] to provide cab transportation back to [facility] at the completion of his appointment.  Call Completed: Darreld Mclean

## 2017-08-14 ENCOUNTER — Telehealth: Payer: Self-pay | Admitting: Family Medicine

## 2017-08-14 NOTE — Telephone Encounter (Signed)
Received an e-mail from [place], Scientist, research (physical sciences), at the [facility] to inform me that patient "is hanging in there. He looks the best I've seen him and has been consistently taking his meds. He's paired up with a "buddy" at the shelter. He'll be at his appointment on Friday."

## 2017-08-16 ENCOUNTER — Inpatient Hospital Stay: Payer: Self-pay | Admitting: Family Medicine

## 2017-08-16 ENCOUNTER — Telehealth: Payer: Self-pay | Admitting: Family Medicine

## 2017-08-16 NOTE — Telephone Encounter (Signed)
Call placed to [facility] [phone], to check on patient's status. Spoke with Rodelia and she informed me that patient was not in the facility and hadn't seen him since her shift started (3pm).   Will send [place], Scientist, research (physical sciences), an update about patient.

## 2017-08-19 ENCOUNTER — Encounter: Payer: Self-pay | Admitting: Pediatric Intensive Care

## 2017-08-19 ENCOUNTER — Telehealth: Payer: Self-pay

## 2017-08-19 NOTE — Telephone Encounter (Signed)
Call received from [place], [place] [facility].  She stated that the patient is at the shelter and is " doing okay."  He has rescheduled his appointment with Dr Jarold Song to 08/30/17 @ 1415.

## 2017-08-20 ENCOUNTER — Telehealth: Payer: Self-pay

## 2017-08-20 NOTE — Telephone Encounter (Signed)
Lisette Abu, RN [facility] called stating that she will be picking up medication for the patient and requested a refill for the wellbutrin.

## 2017-08-20 NOTE — Telephone Encounter (Signed)
It looks like it was discontinued by another provider and was re-listed as historical medication. Will forward to PCP

## 2017-08-21 NOTE — Telephone Encounter (Signed)
At the last visit the plan was for him to go to [facility] the next day and transportation was supposed to have been arranged.  He will need to obtain that from mental health clinician.

## 2017-08-22 ENCOUNTER — Emergency Department (HOSPITAL_COMMUNITY)
Admission: EM | Admit: 2017-08-22 | Discharge: 2017-08-23 | Disposition: A | Discharge status: Home / Self Care | Payer: Medicaid Other | Attending: Emergency Medicine | Admitting: Emergency Medicine

## 2017-08-22 ENCOUNTER — Other Ambulatory Visit: Payer: Self-pay

## 2017-08-22 ENCOUNTER — Emergency Department (HOSPITAL_COMMUNITY): Payer: Medicaid Other

## 2017-08-22 ENCOUNTER — Encounter (HOSPITAL_COMMUNITY): Payer: Self-pay

## 2017-08-22 ENCOUNTER — Telehealth: Payer: Self-pay | Admitting: Family Medicine

## 2017-08-22 DIAGNOSIS — R0602 Shortness of breath: Secondary | ICD-10-CM | POA: Diagnosis present

## 2017-08-22 DIAGNOSIS — I2510 Atherosclerotic heart disease of native coronary artery without angina pectoris: Secondary | ICD-10-CM | POA: Insufficient documentation

## 2017-08-22 DIAGNOSIS — F17210 Nicotine dependence, cigarettes, uncomplicated: Secondary | ICD-10-CM | POA: Insufficient documentation

## 2017-08-22 DIAGNOSIS — T50902A Poisoning by unspecified drugs, medicaments and biological substances, intentional self-harm, initial encounter: Secondary | ICD-10-CM | POA: Diagnosis not present

## 2017-08-22 DIAGNOSIS — Z79899 Other long term (current) drug therapy: Secondary | ICD-10-CM | POA: Insufficient documentation

## 2017-08-22 DIAGNOSIS — F1014 Alcohol abuse with alcohol-induced mood disorder: Secondary | ICD-10-CM | POA: Diagnosis not present

## 2017-08-22 DIAGNOSIS — T1491XA Suicide attempt, initial encounter: Secondary | ICD-10-CM | POA: Diagnosis not present

## 2017-08-22 DIAGNOSIS — Z8673 Personal history of transient ischemic attack (TIA), and cerebral infarction without residual deficits: Secondary | ICD-10-CM | POA: Diagnosis not present

## 2017-08-22 DIAGNOSIS — R110 Nausea: Secondary | ICD-10-CM | POA: Insufficient documentation

## 2017-08-22 DIAGNOSIS — J441 Chronic obstructive pulmonary disease with (acute) exacerbation: Secondary | ICD-10-CM

## 2017-08-22 DIAGNOSIS — B86 Scabies: Secondary | ICD-10-CM

## 2017-08-22 DIAGNOSIS — Z736 Limitation of activities due to disability: Secondary | ICD-10-CM | POA: Diagnosis not present

## 2017-08-22 DIAGNOSIS — I10 Essential (primary) hypertension: Secondary | ICD-10-CM | POA: Insufficient documentation

## 2017-08-22 DIAGNOSIS — Z810 Family history of intellectual disabilities: Secondary | ICD-10-CM | POA: Diagnosis not present

## 2017-08-22 LAB — CBC WITH DIFFERENTIAL/PLATELET
BASOS ABS: 0 10*3/uL (ref 0.0–0.1)
Basophils Relative: 1 %
EOS ABS: 0 10*3/uL (ref 0.0–0.7)
Eosinophils Relative: 0 %
HCT: 33.1 % — ABNORMAL LOW (ref 39.0–52.0)
HEMOGLOBIN: 10.5 g/dL — AB (ref 13.0–17.0)
Lymphocytes Relative: 31 %
Lymphs Abs: 2.5 10*3/uL (ref 0.7–4.0)
MCH: 24.3 pg — ABNORMAL LOW (ref 26.0–34.0)
MCHC: 31.7 g/dL (ref 30.0–36.0)
MCV: 76.6 fL — ABNORMAL LOW (ref 78.0–100.0)
Monocytes Absolute: 0.9 10*3/uL (ref 0.1–1.0)
Monocytes Relative: 11 %
NEUTROS PCT: 57 %
Neutro Abs: 4.5 10*3/uL (ref 1.7–7.7)
Platelets: 396 10*3/uL (ref 150–400)
RBC: 4.32 MIL/uL (ref 4.22–5.81)
RDW: 19.1 % — ABNORMAL HIGH (ref 11.5–15.5)
WBC: 7.9 10*3/uL (ref 4.0–10.5)

## 2017-08-22 LAB — COMPREHENSIVE METABOLIC PANEL
ALK PHOS: 67 U/L (ref 38–126)
ALT: 13 U/L — AB (ref 17–63)
AST: 16 U/L (ref 15–41)
Albumin: 3.2 g/dL — ABNORMAL LOW (ref 3.5–5.0)
Anion gap: 9 (ref 5–15)
BILIRUBIN TOTAL: 0.3 mg/dL (ref 0.3–1.2)
BUN: 10 mg/dL (ref 6–20)
CALCIUM: 8.6 mg/dL — AB (ref 8.9–10.3)
CHLORIDE: 104 mmol/L (ref 101–111)
CO2: 23 mmol/L (ref 22–32)
CREATININE: 0.74 mg/dL (ref 0.61–1.24)
Glucose, Bld: 88 mg/dL (ref 65–99)
Potassium: 3.9 mmol/L (ref 3.5–5.1)
Sodium: 136 mmol/L (ref 135–145)
TOTAL PROTEIN: 6.5 g/dL (ref 6.5–8.1)

## 2017-08-22 LAB — I-STAT TROPONIN, ED
TROPONIN I, POC: 0 ng/mL (ref 0.00–0.08)
TROPONIN I, POC: 0 ng/mL (ref 0.00–0.08)

## 2017-08-22 LAB — LIPASE, BLOOD: LIPASE: 29 U/L (ref 11–51)

## 2017-08-22 LAB — ETHANOL: ALCOHOL ETHYL (B): 48 mg/dL — AB (ref <10)

## 2017-08-22 MED ORDER — THIAMINE HCL 100 MG/ML IJ SOLN: 100.0000 mg | Freq: Every day | INTRAMUSCULAR | Status: DC

## 2017-08-22 MED ORDER — IOPAMIDOL (ISOVUE-370) INJECTION 76%
INTRAVENOUS | Status: AC
  Filled 2017-08-22: qty 100

## 2017-08-22 MED ORDER — PREDNISONE 10 MG PO TABS: 40.0000 mg | ORAL_TABLET | Freq: Every day | ORAL | 0 refills | Status: DC

## 2017-08-22 MED ORDER — LORAZEPAM 1 MG PO TABS
0.0000 mg | ORAL_TABLET | Freq: Four times a day (QID) | ORAL | Status: DC
  Administered 2017-08-22: 1 mg via ORAL
  Filled 2017-08-22: qty 1

## 2017-08-22 MED ORDER — CHLORDIAZEPOXIDE HCL 25 MG PO CAPS: ORAL_CAPSULE | ORAL | 0 refills | Status: DC

## 2017-08-22 MED ORDER — IPRATROPIUM-ALBUTEROL 0.5-2.5 (3) MG/3ML IN SOLN
3.0000 mL | Freq: Once | RESPIRATORY_TRACT | Status: AC
  Administered 2017-08-22: 3 mL via RESPIRATORY_TRACT
  Filled 2017-08-22: qty 3

## 2017-08-22 MED ORDER — BENZONATATE 100 MG PO CAPS: 200.0000 mg | ORAL_CAPSULE | Freq: Three times a day (TID) | ORAL | 0 refills | Status: DC

## 2017-08-22 MED ORDER — ALBUTEROL SULFATE (2.5 MG/3ML) 0.083% IN NEBU: 5.0000 mg | INHALATION_SOLUTION | Freq: Once | RESPIRATORY_TRACT | Status: DC

## 2017-08-22 MED ORDER — ALBUTEROL SULFATE HFA 108 (90 BASE) MCG/ACT IN AERS
1.0000 | INHALATION_SPRAY | Freq: Once | RESPIRATORY_TRACT | Status: AC
  Administered 2017-08-23: 1 via RESPIRATORY_TRACT
  Filled 2017-08-22: qty 6.7

## 2017-08-22 MED ORDER — IOPAMIDOL (ISOVUE-370) INJECTION 76%
100.0000 mL | Freq: Once | INTRAVENOUS | Status: AC | PRN
  Administered 2017-08-22: 100 mL via INTRAVENOUS

## 2017-08-22 MED ORDER — VITAMIN B-1 100 MG PO TABS
100.0000 mg | ORAL_TABLET | Freq: Every day | ORAL | Status: DC
  Administered 2017-08-22: 100 mg via ORAL
  Filled 2017-08-22: qty 1

## 2017-08-22 MED ORDER — LORAZEPAM 2 MG/ML IJ SOLN: 0.0000 mg | Freq: Four times a day (QID) | INTRAMUSCULAR | Status: DC

## 2017-08-22 MED ORDER — LORAZEPAM 2 MG/ML IJ SOLN: 0.0000 mg | Freq: Two times a day (BID) | INTRAMUSCULAR | Status: DC

## 2017-08-22 MED ORDER — LORAZEPAM 1 MG PO TABS: 0.0000 mg | ORAL_TABLET | Freq: Two times a day (BID) | ORAL | Status: DC

## 2017-08-22 NOTE — Discharge Instructions (Signed)
Please read attached information regarding your condition. Take steroids for the next 5 days. Take Tessalon perles as needed for cough. Take Librium to help with alcohol detox. Return to ED for worsening symptoms, increasing shortness of breath, trouble swallowing, shaking spells, palpitations, head injuries, lightheadedness or loss of consciousness.

## 2017-08-22 NOTE — ED Triage Notes (Signed)
Pt reports SOB for "the last 2-3 weeks." He states that it worsened today. Per EMS, pt endorses drink alcohol today. Pt told this RN that he drank yesterday. He is ambulatory and in no distress. Able to speak freely and in complete sentences. A&Ox4.

## 2017-08-22 NOTE — Congregational Nurse Program (Signed)
Congregational Nurse Program Note  Date of Encounter: 08/05/2017  Past Medical History: Past Medical History:  Diagnosis Date  . Alcoholism ([place])   . CAD (coronary artery disease) 2002   MI, no intervention required  . COPD (chronic obstructive pulmonary disease) (HCC)    not on home O2  . Depression   . GERD (gastroesophageal reflux disease)   . Headache   . Hypertension   . Stroke [facility]) 2006    Encounter Details: CNP Questionnaire - 08/05/17 0930      Questionnaire   Patient Status  Not Applicable    Race  White or Caucasian    Location Patient Served At  [facility]    Uninsured  Not Applicable    Food  Yes, have food insecurities;Within past 12 months, worried food would run out with no money to buy more    Housing/Utilities  No permanent housing    Transportation  Provided transportation assistance (bus pass, taxi voucher, etc.);Yes, need transportation assistance    Interpersonal Safety  No, do not feel physically and emotionally safe where you currently live    Medication  Yes, have medication insecurities;Provided medication assistance    Medical Provider  Yes    Referrals  Primary Care Provider/Clinic    ED Visit Averted  Not Applicable    Life-Saving Intervention Made  Not Applicable      BP check, client needs medication picked up from [facility]. Contacted [place] regarding follow up appointment. He has appointment at 1000 tomorrow. Taxi vouchers given. CN reminded patient not to take trazodone while taking azythromycin. Client states he understands.

## 2017-08-22 NOTE — ED Notes (Signed)
Rash noted on his L upper arm, and bila ankles.  He reports itching is worse at night.  [facility] EDPA made aware.

## 2017-08-22 NOTE — Telephone Encounter (Signed)
Call placed to [place], Scientist, research (physical sciences), regarding patient's medication. Spoke with [place] and informed her that provider will not be refilling Wellbutrin and is deferring patient to a mental health provider. Patient was asked to go to [facility] a while back but has yet to do so. [place] informed me that patient is in a better spot than before. He is more stable and has been speaking with Museum/gallery curator at the [employer]. [place] spoke with patient and hopes that once the beginning of the month comes around patient is able to make right choices with his money (as he receives his disability check) and stay away from bad influence (drinking). [place] will have patient meet with social worker again and have patient go to [facility].

## 2017-08-22 NOTE — ED Provider Notes (Signed)
[facility] DEPT Provider Note   CSN: [id number] Arrival date & time: 08/22/17  1412     History   Chief Complaint Chief Complaint  Patient presents with  . Shortness of Breath  . Alcohol Problem    HPI Samuel Reyes is a 62 y.o. male with a past medical history of CAD, COPD, GERD, EtOH abuse, hypertension, who presents to ED for evaluation of chest pain, shortness of breath, cough and other influenza-like symptoms for the past 4 days, including nasal congestion and nausea.  He also reports intermittent chills.  States that symptoms began suddenly but have gotten progressively worse.  He also reports 1 episode of hemoptysis.  He did receive his influenza vaccine this year.  He has tried his home inhalers which she uses daily, with mild improvement in his symptoms.  He denies any abdominal pain, vomiting, urinary symptoms, bowel changes, dyspnea on exertion, tremors.  He does endorse daily alcohol and tobacco use with his last alcoholic beverage being earlier today.  He denies any other drug use. Denies hallucinations.  HPI  Past Medical History:  Diagnosis Date  . Alcoholism ([place])   . CAD (coronary artery disease) 2002   MI, no intervention required  . COPD (chronic obstructive pulmonary disease) (HCC)    not on home O2  . Depression   . GERD (gastroesophageal reflux disease)   . Headache   . Hypertension   . Stroke [facility]) 2006    Patient Active Problem List   Diagnosis Date Noted  . Alcohol withdrawal ([place]) 08/08/2017  . COPD with acute bronchitis ([place]) 07/29/2017  . Alcohol use   . Homelessness 06/24/2017  . Lung nodule 09/03/2016  . Tobacco abuse 09/03/2016  . Insomnia 09/03/2016  . HCAP (healthcare-associated pneumonia) 08/22/2016  . Bronchiolitis 08/21/2016  . Malnutrition of moderate degree 08/04/2016  . Atrial tachycardia ([place]) 08/02/2016  . Impaired glucose tolerance 08/02/2016  . Alcohol-induced mood disorder ([place]) 12/12/2015    . Syncope 11/15/2015  . Chest pain 11/15/2015  . Nausea vomiting and diarrhea 11/15/2015  . Abdominal pain 11/15/2015  . Major depressive disorder, recurrent episode, moderate with anxious distress ([place]) 10/30/2015  . Alcohol use disorder, severe, dependence ([place]) 10/29/2015  . COPD exacerbation ([place]) 09/20/2015  . Acute respiratory failure with hypoxia ([place]) 09/18/2015  . Suicidal ideation 09/17/2015  . Alcohol intoxication ([place]) 09/17/2015  . Depression 09/17/2015  . Benign essential HTN 09/17/2015  . Hypokalemia 09/17/2015  . Hyponatremia 09/17/2015  . Coffee ground emesis 09/17/2015  . COPD (chronic obstructive pulmonary disease) ([place]) 02/17/2013  . GERD (gastroesophageal reflux disease) 02/17/2013    Past Surgical History:  Procedure Laterality Date  . BACK SURGERY     3 cervical spine surgeries C4-C5 fused  . COLONOSCOPY N/A 01/04/2014   Procedure: COLONOSCOPY;  Surgeon: Danie Binder, MD;  Location: [facility] ENDO [facility];  Service: Endoscopy;  Laterality: N/A;  1:45  . ESOPHAGOGASTRODUODENOSCOPY N/A 01/04/2014   Procedure: ESOPHAGOGASTRODUODENOSCOPY (EGD);  Surgeon: Danie Binder, MD;  Location: [facility] ENDO SUITE;  Service: Endoscopy;  Laterality: N/A;  . FINGER SURGERY Left    2nd, 3rd, & 4th fingers were cut off by table saw and reattached  . GASTRECTOMY    . HERNIA REPAIR    . INCISIONAL HERNIA REPAIR N/A 01/20/2014   Procedure: LAPAROSCOPIC RECURRENT  INCISIONAL HERNIA with mesh;  Surgeon: Edward Jolly, MD;  Location: [facility] ORS;  Service: General;  Laterality: N/A;  . rt knee arthroscopic surgery    .  SHOULDER SURGERY Bilateral    3 surgeries on on left, 2 surgeries on right        Home Medications    Prior to Admission medications   Medication Sig Start Date End Date Taking? Authorizing Provider  acetaminophen (TYLENOL) 500 MG tablet Take 1,000 mg by mouth every 6 (six) hours as needed for mild pain, moderate pain, fever or headache.   Yes [provider]   albuterol (PROVENTIL HFA;VENTOLIN HFA) 108 (90 Base) MCG/ACT inhaler Inhale 1-2 puffs into the lungs every 4 (four) hours as needed for wheezing. 08/29/16  Yes Newlin, Enobong, MD  buPROPion (WELLBUTRIN SR) 200 MG 12 hr tablet Take 200 mg by mouth at bedtime. 07/26/17  Yes [provider]  meloxicam (MOBIC) 7.5 MG tablet Take 1 tablet (7.5 mg total) by mouth daily. 07/16/17  Yes Charlott Rakes, MD  metoprolol (LOPRESSOR) 50 MG tablet Take 1 tablet (50 mg total) by mouth 2 (two) times daily. 09/03/16  Yes Charlott Rakes, MD  Multiple Vitamin (MULTIVITAMIN WITH MINERALS) TABS tablet Take 1 tablet by mouth daily. 07/02/17  Yes Barton Dubois, MD  SPIRIVA HANDIHALER 18 MCG inhalation capsule Place 1 puff into inhaler and inhale daily. 07/03/17  Yes [provider]  traZODone (DESYREL) 100 MG tablet Take 1 tablet (100 mg total) by mouth at bedtime. 07/01/17  Yes Barton Dubois, MD  benzonatate (TESSALON) 100 MG capsule Take 2 capsules (200 mg total) by mouth every 8 (eight) hours. 08/22/17   Torrance Stockley, PA-C  chlordiazePOXIDE (LIBRIUM) 25 MG capsule 50mg  PO TID x 1D, then 25-50mg  PO BID X 1D, then 25-50mg  PO QD X 1D 08/22/17   Haroun Cotham, PA-C  predniSONE (DELTASONE) 10 MG tablet Take 4 tablets (40 mg total) by mouth daily for 5 days. 08/22/17 08/27/17  Delia Heady, PA-C    Family History Family History  Problem Relation Age of Onset  . Cancer Father        bone  . Cancer Brother        lungs  . Stroke Maternal Grandmother   . Asthma Son        died at age 68 in his sleep   . Spina bifida Son        died at age 22   . Dementia Mother   . Colon cancer Neg Hx     Social History Social History   Tobacco Use  . Smoking status: Current Every Day Smoker    Packs/day: 2.00    Years: 52.00    Pack years: 104.00    Types: Cigarettes    Start date: 07/30/1966  . Smokeless tobacco: Never Used  . Tobacco comment: down to 1/4 ppd  Substance Use Topics  . Alcohol use: Yes     Alcohol/week: 1.8 - 2.4 oz    Types: 3 - 4 Cans of beer per week    Comment: everyday- 3-4 40 oz  . Drug use: No    Comment: denied using any drugs     Allergies   Bee venom and Penicillins   Review of Systems Review of Systems  Constitutional: Negative for appetite change, chills and fever.  HENT: Positive for congestion and rhinorrhea. Negative for ear pain, sneezing, sore throat and trouble swallowing.   Eyes: Negative for photophobia and visual disturbance.  Respiratory: Positive for cough and shortness of breath. Negative for chest tightness and wheezing.   Cardiovascular: Positive for chest pain. Negative for palpitations.  Gastrointestinal: Positive for nausea. Negative for abdominal  pain, blood in stool, constipation, diarrhea and vomiting.  Genitourinary: Negative for dysuria, hematuria and urgency.  Musculoskeletal: Negative for myalgias.  Skin: Negative for rash.  Neurological: Negative for dizziness, weakness and light-headedness.     Physical Exam Updated Vital Signs BP (!) 98/56 (BP Location: Left Arm)   Pulse 88   Temp 98.4 F (36.9 C) (Oral)   Resp 19   Ht 5\' 4"  (1.626 m)   Wt 68 kg (150 lb)   SpO2 98%   BMI 25.75 kg/m   Physical Exam  Constitutional: He appears well-developed and well-nourished. No distress.  Nontoxic appearing and in no acute distress.  Speaking complete sentences without difficulty.  No tremors noted. Appears disheveled, older than stated age.  HENT:  Head: Normocephalic and atraumatic.  Nose: Nose normal.  Eyes: Conjunctivae and EOM are normal. Left eye exhibits no discharge. No scleral icterus.  Neck: Normal range of motion. Neck supple.  Cardiovascular: Normal rate, regular rhythm, normal heart sounds and intact distal pulses. Exam reveals no gallop and no friction rub.  No murmur heard. Pulmonary/Chest: Effort normal. No respiratory distress. He has rhonchi in the right middle field, the right lower field, the left middle field  and the left lower field.  Abdominal: Soft. Bowel sounds are normal. He exhibits no distension. There is no tenderness. There is no guarding.  Musculoskeletal: Normal range of motion. He exhibits no edema.  Neurological: He is alert. He exhibits normal muscle tone. Coordination normal.  Skin: Skin is warm and dry. Rash noted.  Erythematous rash noted on bilateral UE and LE appears c/w scabies.  Psychiatric: He has a normal mood and affect.  No hallucinations reported.  Nursing note and vitals reviewed.    ED Treatments / Results  Labs (all labs ordered are listed, but only abnormal results are displayed) Labs Reviewed  COMPREHENSIVE METABOLIC PANEL - Abnormal; Notable for the following components:      Result Value   Calcium 8.6 (*)    Albumin 3.2 (*)    ALT 13 (*)    All other components within normal limits  CBC WITH DIFFERENTIAL/PLATELET - Abnormal; Notable for the following components:   Hemoglobin 10.5 (*)    HCT 33.1 (*)    MCV 76.6 (*)    MCH 24.3 (*)    RDW 19.1 (*)    All other components within normal limits  ETHANOL - Abnormal; Notable for the following components:   Alcohol, Ethyl (B) 48 (*)    All other components within normal limits  LIPASE, BLOOD  I-STAT TROPONIN, ED  I-STAT TROPONIN, ED    EKG  EKG Interpretation  Date/Time:  Thursday August 22 2017 15:39:40 EST Ventricular Rate:  65 PR Interval:    QRS Duration: 96 QT Interval:  434 QTC Calculation: 452 R Axis:   76 Text Interpretation:  Sinus rhythm Abnormal R-wave progression, early transition Confirmed by Virgel Manifold [id number]) on 08/22/2017 8:27:34 PM       Radiology Dg Chest 2 View  Result Date: 08/22/2017 CLINICAL DATA:  Increasing shortness of breath for 1 month. Coughing up blood. EXAM: CHEST  2 VIEW COMPARISON:  08/07/2017 FINDINGS: Again noted are surgical clips in the upper abdomen. Both lungs are clear. Heart and mediastinum are within normal limits. Trachea is midline. No large  pleural effusions. Evidence for old fractures involving the anterior right sixth and seventh ribs. IMPRESSION: No active cardiopulmonary disease. Electronically Signed   By: Markus Daft M.D.   On: 08/22/2017 15:29  Ct Angio Chest Pe W/cm &/or Wo Cm  Result Date: 08/22/2017 CLINICAL DATA:  Dyspnea for the past 2-3 weeks worsening today. EXAM: CT ANGIOGRAPHY CHEST WITH CONTRAST TECHNIQUE: Multidetector CT imaging of the chest was performed using the standard protocol during bolus administration of intravenous contrast. Multiplanar CT image reconstructions and MIPs were obtained to evaluate the vascular anatomy. CONTRAST:  121mL ISOVUE-370 IOPAMIDOL (ISOVUE-370) INJECTION 76% COMPARISON:  06/29/2017 FINDINGS: Cardiovascular: Borderline cardiomegaly without pericardial effusion. Left main and three-vessel coronary arteriosclerosis. Mild aortic atherosclerosis at the arch. No aneurysm or dissection. No pulmonary embolus. Mediastinum/Nodes: No enlarged mediastinal, hilar, or axillary lymph nodes. Thyroid gland, trachea, and esophagus demonstrate no significant findings. Lungs/Pleura: Minimal dependent atelectasis in the right lower lobe. Minimal right apical pleuroparenchymal scarring. No pneumonic consolidation, CHF, effusion or pneumothorax. No dominant mass. Upper Abdomen: Surgical clips at the GE juncture. No acute abnormality. Musculoskeletal: No chest wall abnormality. No acute or significant osseous findings. Review of the MIP images confirms the above findings. IMPRESSION: 1. No active pulmonary disease. Minimal scarring in the right upper lobe with dependent atelectasis in the right lower lobe. 2. Left main and three-vessel coronary arteriosclerosis. 3. No acute pulmonary embolus. Aortic Atherosclerosis (ICD10-I70.0). Electronically Signed   By: Ashley Royalty M.D.   On: 08/22/2017 23:33    Procedures Procedures (including critical care time)  Medications Ordered in ED Medications  LORazepam (ATIVAN)  injection 0-4 mg ( Intravenous See Alternative 08/22/17 2217)    Or  LORazepam (ATIVAN) tablet 0-4 mg (1 mg Oral Given 08/22/17 2217)  LORazepam (ATIVAN) injection 0-4 mg (not administered)    Or  LORazepam (ATIVAN) tablet 0-4 mg (not administered)  thiamine (VITAMIN B-1) tablet 100 mg (100 mg Oral Given 08/22/17 2217)    Or  thiamine (B-1) injection 100 mg ( Intravenous See Alternative 08/22/17 2217)  iopamidol (ISOVUE-370) 76 % injection (not administered)  ipratropium-albuterol (DUONEB) 0.5-2.5 (3) MG/3ML nebulizer solution 3 mL (3 mLs Nebulization Given 08/22/17 2034)  iopamidol (ISOVUE-370) 76 % injection 100 mL (100 mLs Intravenous Contrast Given 08/22/17 2259)  albuterol (PROVENTIL HFA;VENTOLIN HFA) 108 (90 Base) MCG/ACT inhaler 1 puff (1 puff Inhalation Given 08/23/17 0000)     Initial Impression / Assessment and Plan / ED Course  I have reviewed the triage vital signs and the nursing notes.  Pertinent labs & imaging results that were available during my care of the patient were reviewed by me and considered in my medical decision making (see chart for details).  Clinical Course as of Aug 23 2  Thu Aug 22, 2017  2319 Reports improvement in symptoms with Duoneb. Lung sounds improved. Waiting on delta troponin and CTA chest reading.  [HK]    Clinical Course User Index [HK] Shelly Coss, Malak Orantes, PA-C    Patient, with a past medical history of CAD, COPD, GERD ETOH abuse, HTN, who presents to ED for evaluation of chest pain, SOB, cough and other influenza like symptoms for the past 4 days, including nasal congestion and nausea. Also reports 1 episode of hemoptysis when symptoms first began. Has taken his home inhalers with mild improvement in symptoms. Denies any vomiting, hematemesis, abdominal pain. On my initial evaluation, he displays no signs of withdrawal from ETOH. He has normal HR, no shaking or hallucinations reported. Coarse breath sounds in bilateral lung fields noted.  Reports chest  pain in lower/epigastric area. CBC, CMP, lipase unremarkable. Initial troponin negative. ETOH initially 48. EKG with tracings similar to previous. CTA of chest was obtained due to negative  CXR but coarse breath sounds, comorbidities, age. This was negative for PE or other abnormality. Patient was admitted and discharged for chest pain rule out approx 2 weeks ago with negative workup. He was suffering with alcohol withdrawal symptoms at that time. Echo was done which was unremarkable. Serial troponins were also unremarkable then. Delta troponin today is negative. Patient reports complete resolution of his symptoms with Duoneb given here. He remains in NAD with no signs of ETOH withdrawal. CIWA initially was 5, given PO Ativan. I do not believe he needs to be admitted for ETOH withdrawal at this time. I believe his symptoms are due to a possible COPD exacerbation rather than cardiac caused based on his negative workup last week. Will discharge with prednisone burst, Tessalon perles and refill for inhalers. I did offer patient Librium and educated him on using this for alcohol detox. He does have scabies rash noted on BLE, BUE, so will give Permethrin. Patient appears stable for discharge at this time. Strict return precautions given.  Final Clinical Impressions(s) / ED Diagnoses   Final diagnoses:  COPD exacerbation [facility])    ED Discharge Orders        Ordered    predniSONE (DELTASONE) 10 MG tablet  Daily     08/22/17 2352    chlordiazePOXIDE (LIBRIUM) 25 MG capsule     08/22/17 2352    benzonatate (TESSALON) 100 MG capsule  Every 8 hours     08/22/17 2352     Portions of this note were generated with [employer]. Dictation errors may occur despite best attempts at proofreading.    Delia Heady, PA-C 08/23/17 0007    Virgel Manifold, MD 08/24/17 [phone]

## 2017-08-23 ENCOUNTER — Encounter: Payer: Self-pay | Admitting: Pediatric Intensive Care

## 2017-08-23 ENCOUNTER — Telehealth: Payer: Self-pay

## 2017-08-23 ENCOUNTER — Other Ambulatory Visit: Payer: Self-pay

## 2017-08-23 ENCOUNTER — Emergency Department (EMERGENCY_DEPARTMENT_HOSPITAL)
Admission: EM | Admit: 2017-08-23 | Discharge: 2017-08-26 | Disposition: A | Discharge status: Other Acute Inpatient Hospital | Payer: Medicaid Other | Source: Home / Self Care | Attending: Emergency Medicine | Admitting: Emergency Medicine

## 2017-08-23 DIAGNOSIS — R45851 Suicidal ideations: Secondary | ICD-10-CM

## 2017-08-23 DIAGNOSIS — F1020 Alcohol dependence, uncomplicated: Secondary | ICD-10-CM | POA: Diagnosis present

## 2017-08-23 DIAGNOSIS — T50902A Poisoning by unspecified drugs, medicaments and biological substances, intentional self-harm, initial encounter: Secondary | ICD-10-CM

## 2017-08-23 DIAGNOSIS — F1094 Alcohol use, unspecified with alcohol-induced mood disorder: Secondary | ICD-10-CM | POA: Diagnosis present

## 2017-08-23 LAB — COMPREHENSIVE METABOLIC PANEL
ALK PHOS: 72 U/L (ref 38–126)
ALT: 13 U/L — ABNORMAL LOW (ref 17–63)
AST: 17 U/L (ref 15–41)
Albumin: 3.4 g/dL — ABNORMAL LOW (ref 3.5–5.0)
Anion gap: 7 (ref 5–15)
BILIRUBIN TOTAL: 0.2 mg/dL — AB (ref 0.3–1.2)
BUN: 14 mg/dL (ref 6–20)
CALCIUM: 8.6 mg/dL — AB (ref 8.9–10.3)
CO2: 24 mmol/L (ref 22–32)
Chloride: 104 mmol/L (ref 101–111)
Creatinine, Ser: 0.73 mg/dL (ref 0.61–1.24)
GFR calc Af Amer: >60 mL/min (ref >60)
Glucose, Bld: 61 mg/dL — ABNORMAL LOW (ref 65–99)
POTASSIUM: 3.7 mmol/L (ref 3.5–5.1)
Sodium: 135 mmol/L (ref 135–145)
TOTAL PROTEIN: 6.9 g/dL (ref 6.5–8.1)

## 2017-08-23 LAB — CBC WITH DIFFERENTIAL/PLATELET
BASOS ABS: 0 10*3/uL (ref 0.0–0.1)
BASOS PCT: 0 %
EOS ABS: 0 10*3/uL (ref 0.0–0.7)
Eosinophils Relative: 0 %
HEMATOCRIT: 32.4 % — AB (ref 39.0–52.0)
Hemoglobin: 10.4 g/dL — ABNORMAL LOW (ref 13.0–17.0)
Lymphocytes Relative: 22 %
Lymphs Abs: 2.2 10*3/uL (ref 0.7–4.0)
MCH: 24.5 pg — ABNORMAL LOW (ref 26.0–34.0)
MCHC: 32.1 g/dL (ref 30.0–36.0)
MCV: 76.2 fL — ABNORMAL LOW (ref 78.0–100.0)
Monocytes Absolute: 1 10*3/uL (ref 0.1–1.0)
Monocytes Relative: 10 %
NEUTROS PCT: 68 %
Neutro Abs: 6.9 10*3/uL (ref 1.7–7.7)
Platelets: 374 10*3/uL (ref 150–400)
RBC: 4.25 MIL/uL (ref 4.22–5.81)
RDW: 19.1 % — ABNORMAL HIGH (ref 11.5–15.5)
WBC: 10.2 10*3/uL (ref 4.0–10.5)

## 2017-08-23 LAB — URINALYSIS, ROUTINE W REFLEX MICROSCOPIC
BILIRUBIN URINE: NEGATIVE
Glucose, UA: NEGATIVE mg/dL
Hgb urine dipstick: NEGATIVE
Ketones, ur: NEGATIVE mg/dL
LEUKOCYTES UA: NEGATIVE
NITRITE: NEGATIVE
PH: 6 (ref 5.0–8.0)
Protein, ur: NEGATIVE mg/dL
SPECIFIC GRAVITY, URINE: 1.006 (ref 1.005–1.030)

## 2017-08-23 LAB — RAPID URINE DRUG SCREEN, HOSP PERFORMED
AMPHETAMINES: NOT DETECTED
BARBITURATES: NOT DETECTED
Benzodiazepines: NOT DETECTED
Cocaine: NOT DETECTED
OPIATES: NOT DETECTED
Tetrahydrocannabinol: NOT DETECTED

## 2017-08-23 LAB — ACETAMINOPHEN LEVEL: Acetaminophen (Tylenol), Serum: <10 ug/mL — ABNORMAL LOW (ref 10–30)

## 2017-08-23 LAB — CBG MONITORING, ED: Glucose-Capillary: 83 mg/dL (ref 65–99)

## 2017-08-23 LAB — ETHANOL: Alcohol, Ethyl (B): 184 mg/dL — ABNORMAL HIGH (ref <10)

## 2017-08-23 LAB — SALICYLATE LEVEL

## 2017-08-23 MED ORDER — PERMETHRIN 5 % EX CREA
TOPICAL_CREAM | Freq: Once | CUTANEOUS | Status: AC
  Administered 2017-08-24: via TOPICAL
  Filled 2017-08-23: qty 60

## 2017-08-23 MED ORDER — ONDANSETRON HCL 4 MG PO TABS: 4.0000 mg | ORAL_TABLET | Freq: Three times a day (TID) | ORAL | Status: DC | PRN

## 2017-08-23 MED ORDER — LORAZEPAM 1 MG PO TABS
0.0000 mg | ORAL_TABLET | Freq: Four times a day (QID) | ORAL | Status: AC
  Administered 2017-08-24 – 2017-08-25 (×2): 1 mg via ORAL
  Filled 2017-08-23: qty 1
  Filled 2017-08-23: qty 2

## 2017-08-23 MED ORDER — LORAZEPAM 2 MG/ML IJ SOLN: 0.0000 mg | Freq: Two times a day (BID) | INTRAMUSCULAR | Status: DC

## 2017-08-23 MED ORDER — ACETAMINOPHEN 325 MG PO TABS
650.0000 mg | ORAL_TABLET | ORAL | Status: DC | PRN
  Administered 2017-08-23: 650 mg via ORAL
  Filled 2017-08-23: qty 2

## 2017-08-23 MED ORDER — BUPROPION HCL ER (SR) 100 MG PO TB12
200.0000 mg | ORAL_TABLET | Freq: Every day | ORAL | Status: DC
  Administered 2017-08-24 – 2017-08-25 (×3): 200 mg via ORAL
  Filled 2017-08-23 (×4): qty 2

## 2017-08-23 MED ORDER — ALBUTEROL SULFATE HFA 108 (90 BASE) MCG/ACT IN AERS: 1.0000 | INHALATION_SPRAY | RESPIRATORY_TRACT | Status: DC | PRN

## 2017-08-23 MED ORDER — MELOXICAM 7.5 MG PO TABS
7.5000 mg | ORAL_TABLET | Freq: Every day | ORAL | Status: DC
  Filled 2017-08-23: qty 1

## 2017-08-23 MED ORDER — SODIUM CHLORIDE 0.9 % IV SOLN
INTRAVENOUS | Status: DC
  Administered 2017-08-23: 20:00:00 via INTRAVENOUS

## 2017-08-23 MED ORDER — LORAZEPAM 2 MG/ML IJ SOLN
0.0000 mg | Freq: Four times a day (QID) | INTRAMUSCULAR | Status: AC
  Administered 2017-08-23: 1 mg via INTRAVENOUS
  Filled 2017-08-23: qty 1

## 2017-08-23 MED ORDER — SODIUM CHLORIDE 0.9 % IV BOLUS (SEPSIS)
1000.0000 mL | Freq: Once | INTRAVENOUS | Status: AC
  Administered 2017-08-23: 1000 mL via INTRAVENOUS

## 2017-08-23 MED ORDER — THIAMINE HCL 100 MG/ML IJ SOLN: 100.0000 mg | Freq: Every day | INTRAMUSCULAR | Status: DC

## 2017-08-23 MED ORDER — TIOTROPIUM BROMIDE MONOHYDRATE 18 MCG IN CAPS
18.0000 ug | ORAL_CAPSULE | Freq: Every day | RESPIRATORY_TRACT | Status: DC
  Administered 2017-08-24 – 2017-08-26 (×3): 18 ug via RESPIRATORY_TRACT
  Filled 2017-08-23: qty 5

## 2017-08-23 MED ORDER — ALUM & MAG HYDROXIDE-SIMETH 200-200-20 MG/5ML PO SUSP: 30.0000 mL | Freq: Four times a day (QID) | ORAL | Status: DC | PRN

## 2017-08-23 MED ORDER — NICOTINE 21 MG/24HR TD PT24
21.0000 mg | MEDICATED_PATCH | Freq: Every day | TRANSDERMAL | Status: DC
Start: 2017-08-23 — End: 2017-08-26
  Administered 2017-08-24 – 2017-08-26 (×4): 21 mg via TRANSDERMAL
  Filled 2017-08-23 (×4): qty 1

## 2017-08-23 MED ORDER — VITAMIN B-1 100 MG PO TABS
100.0000 mg | ORAL_TABLET | Freq: Every day | ORAL | Status: DC
  Administered 2017-08-24 – 2017-08-26 (×4): 100 mg via ORAL
  Filled 2017-08-23 (×4): qty 1

## 2017-08-23 MED ORDER — LORAZEPAM 1 MG PO TABS: 0.0000 mg | ORAL_TABLET | Freq: Two times a day (BID) | ORAL | Status: DC

## 2017-08-23 MED ORDER — PERMETHRIN 5 % EX CREA
TOPICAL_CREAM | CUTANEOUS | 0 refills | Status: DC
Start: 2017-08-23 — End: 2017-08-30

## 2017-08-23 MED ORDER — ADULT MULTIVITAMIN W/MINERALS CH
1.0000 | ORAL_TABLET | Freq: Every day | ORAL | Status: DC
  Administered 2017-08-24 – 2017-08-26 (×4): 1 via ORAL
  Filled 2017-08-23 (×4): qty 1

## 2017-08-23 MED FILL — CHLORDIAZEPOXIDE 25 MG CAP: 25 | 3 days supply | Qty: 10 | Fill #0

## 2017-08-23 MED FILL — BENZONATATE 100 MG CAPS: 100 | 4 days supply | Qty: 21 | Fill #0

## 2017-08-23 MED FILL — predniSONE 20 MG TABS: 20 | 5 days supply | Qty: 10 | Fill #0

## 2017-08-23 MED FILL — PERMETHRIN 5% CREAM: 5 | 1 days supply | Qty: 60 | Fill #0

## 2017-08-23 NOTE — ED Triage Notes (Signed)
Pt admitted to taking 10 Trazodone. Medication bottles that were empty were due to be filled 08/16/17. Pt had 3 other full bottles of medications that were filled today.

## 2017-08-23 NOTE — BH Assessment (Addendum)
Assessment Note  Samuel Reyes is an 62 y.o. male who presents to the ED voluntarily. Per chart, pt intentionally ingested an unknown number of pills PTA. It is unclear how many pills the pt actually took. Chart states the pt may have taken more than 20 pills, however triage note reports the pt admitted to taking 10 Trazodone pills. Pt does not disclose to this writer how many pills he actually took, however he does admit that he attempted to kill himself by "taking a bunch of pills."   Pt states he is suicidal because he is alone. Pt states his wife and both of his sons passed away and he now has nothing to live for. Pt is homeless and has been staying at [facility]. Pt is a heavy alcohol drinker and admits to consuming up to 5 40oz beers daily. Pt's current BAL is 184 on arrival to ED.   Per chart, pt was recently evaluated by TTS on 08/07/17 and instructed to follow up with OPT services including [facility]. Pt states he has not followed up with any OPT providers and does not disclose the barriers that are prohibiting him from seeking OPT treatment.   Pt reports feelings of hopelessness, low self-worth, lack of motivation, and somber disposition. Pt reports he has received SA treatment at [facility] and his longest period of sobriety was 9 months. Pt has a hx of inpt hospitalizations c/o alcohol dependence and suicidal ideations. Pt agrees to sign VOL consent for treatment.  Diagnosis: MDD, recurrent, w/o psychosis; Alcohol Use Disorder, severe  Past Medical History:  Past Medical History:  Diagnosis Date  . Alcoholism ([place])   . CAD (coronary artery disease) 2002   MI, no intervention required  . COPD (chronic obstructive pulmonary disease) (HCC)    not on home O2  . Depression   . GERD (gastroesophageal reflux disease)   . Headache   . Hypertension   . Stroke [facility]) 2006    Past Surgical History:  Procedure Laterality Date  . BACK SURGERY     3 cervical spine  surgeries C4-C5 fused  . COLONOSCOPY N/A 01/04/2014   Procedure: COLONOSCOPY;  Surgeon: Danie Binder, MD;  [facility];  Service: Endoscopy;  Laterality: N/A;  1:45  . ESOPHAGOGASTRODUODENOSCOPY N/A 01/04/2014   Procedure: ESOPHAGOGASTRODUODENOSCOPY (EGD);  Surgeon: Danie Binder, MD;  Location: [facility];  Service: Endoscopy;  Laterality: N/A;  . FINGER SURGERY Left    2nd, 3rd, & 4th fingers were cut off by table saw and reattached  . GASTRECTOMY    . HERNIA REPAIR    . INCISIONAL HERNIA REPAIR N/A 01/20/2014   Procedure: LAPAROSCOPIC RECURRENT  INCISIONAL HERNIA with mesh;  Surgeon: Edward Jolly, MD;  Location: [facility] ORS;  Service: General;  Laterality: N/A;  . rt knee arthroscopic surgery    . SHOULDER SURGERY Bilateral    3 surgeries on on left, 2 surgeries on right     Family History:  Family History  Problem Relation Age of Onset  . Cancer Father        bone  . Cancer Brother        lungs  . Stroke Maternal Grandmother   . Asthma Son        died at age 23 in his sleep   . Spina bifida Son        died at age 50   . Dementia Mother   . Colon cancer Neg Hx  Social History:  reports that he has been smoking cigarettes.  He started smoking about 51 years ago. He has a 104.00 pack-year smoking history. he has never used smokeless tobacco. He reports that he drinks about 1.8 - 2.4 oz of alcohol per week. He reports that he does not use drugs.  Additional Social History:  Alcohol / Drug Use Pain Medications: See MAR Prescriptions: See MAR Over the Counter: See MAR History of alcohol / drug use?: Yes Substance #1 Name of Substance 1: Alcohol 1 - Age of First Use: pt reports "when my first son died 8 years ago" 1 - Amount (size/oz): 5 beers (40 oz each) 1 - Frequency: daily 1 - Duration: ongoing 1 - Last Use / Amount: 08/23/17  CIWA: CIWA-Ar BP: 104/67 Pulse Rate: 69 Nausea and Vomiting: no nausea and no vomiting Tactile Disturbances: mild itching,  pins and needles, burning or numbness Tremor: no tremor Auditory Disturbances: not present Paroxysmal Sweats: no sweat visible Visual Disturbances: not present Anxiety: mildly anxious Headache, Fullness in Head: mild Agitation: somewhat more than normal activity Orientation and Clouding of Sensorium: cannot do serial additions or is uncertain about date CIWA-Ar Total: 7 COWS:    Allergies:  Allergies  Allergen Reactions  . Bee Venom Anaphylaxis  . Penicillins Rash    Has patient had a PCN reaction causing immediate rash, facial/tongue/throat swelling, SOB or lightheadedness with hypotension: {Yes Has patient had a PCN reaction causing severe rash involving mucus membranes or skin necrosis: YES Has patient had a PCN reaction that required hospitalization Yes Has patient had a PCN reaction occurring within the last 10 years: YES If all of the above answers are "NO", then may proceed with Cephalosporin use.     Home Medications:  (Not in a hospital admission)  OB/GYN Status:  No LMP for male patient.  General Assessment Data [facility] of Assessment: [facility] TTS Assessment: In system Is this a Tele or Face-to-Face Assessment?: Face-to-Face Is this an Initial Assessment or a Re-assessment for this encounter?: Initial Assessment Marital status: Widowed Is patient pregnant?: No Pregnancy Status: No Living Arrangements: Other (Comment)(homeless) Can pt return to current living arrangement?: Yes Admission Status: Voluntary Is patient capable of signing voluntary admission?: Yes Referral Source: Self/Family/Friend Insurance type: Medicaid     Crisis Care Plan Living Arrangements: Other (Comment)(homeless) Name of Psychiatrist: none Name of Therapist: none  Education Status Is patient currently in school?: No Highest grade of school patient has completed: 10th  Risk to self with the past 6 months Suicidal Ideation: Yes-Currently Present Has patient been a risk to self  within the past 6 months prior to admission? : Yes Suicidal Intent: Yes-Currently Present Has patient had any suicidal intent within the past 6 months prior to admission? : Yes Is patient at risk for suicide?: Yes Suicidal Plan?: Yes-Currently Present Has patient had any suicidal plan within the past 6 months prior to admission? : Yes Specify Current Suicidal Plan: pt reports PTA to the ED he intentionally ingested 10 Trazdone Access to Means: Yes Specify Access to Suicidal Means: pt has access to medication What has been your use of drugs/alcohol within the last 12 months?: reports to daily alcohol use  Previous Attempts/Gestures: Yes How many times?: 1 Triggers for Past Attempts: Family contact, Other personal contacts, Spouse contact Intentional Self Injurious Behavior: None Family Suicide History: Yes(pt says his sister killed herself ) Recent stressful life event(s): Loss (Comment), Other (Comment)(homeless, wife and children died ) Persecutory voices/beliefs?: No Depression: Yes  Depression Symptoms: Despondent, Insomnia, Tearfulness, Isolating, Fatigue, Guilt, Loss of interest in usual pleasures, Feeling worthless/self pity, Feeling angry/irritable Substance abuse history and/or treatment for substance abuse?: Yes Suicide prevention information given to non-admitted patients: Not applicable  Risk to Others within the past 6 months Homicidal Ideation: No Does patient have any lifetime risk of violence toward others beyond the six months prior to admission? : No Thoughts of Harm to Others: No Current Homicidal Intent: No Current Homicidal Plan: No Access to Homicidal Means: No History of harm to others?: No Assessment of Violence: None Noted Does patient have access to weapons?: Yes (Comment)(pt says he owns guns ) Criminal Charges Pending?: No Does patient have a court date: No Is patient on probation?: No  Psychosis Hallucinations: None noted Delusions: None  noted  Mental Status Report Appearance/Hygiene: In scrubs, Disheveled Eye Contact: Poor Motor Activity: Unsteady Speech: Slow, Slurred Level of Consciousness: Drowsy Mood: Depressed, Sad, Worthless, low self-esteem Affect: Depressed, Flat Anxiety Level: None Thought Processes: Coherent, Relevant Judgement: Impaired Orientation: Person, Place, Time, Situation, Appropriate for developmental age Obsessive Compulsive Thoughts/Behaviors: None  Cognitive Functioning Concentration: Normal Memory: Remote Intact, Recent Intact IQ: Average Insight: Poor Impulse Control: Poor Appetite: Fair Sleep: Decreased Total Hours of Sleep: 3 Vegetative Symptoms: None  ADLScreening [facility] Assessment Services) Patient's cognitive ability adequate to safely complete daily activities?: Yes Patient able to express need for assistance with ADLs?: Yes Independently performs ADLs?: Yes (appropriate for developmental age)  Prior Inpatient Therapy Prior Inpatient Therapy: Yes Prior Therapy Dates: multiple (last time 04/2017) Prior Therapy Facilty/Provider(s): multiple (last time was at [facility]) Reason for Treatment: depression  Prior Outpatient Therapy Prior Outpatient Therapy: Yes Prior Therapy Dates: pt reports "years ago" Prior Therapy Facilty/Provider(s): [facility] Reason for Treatment: MED MANAGEMENT Does patient have an ACCT team?: No Does patient have Intensive In-House Services?  : No Does patient have [facility] services? : No Does patient have P4CC services?: No  ADL Screening (condition at time of admission) Patient's cognitive ability adequate to safely complete daily activities?: Yes Is the patient deaf or have difficulty hearing?: No Does the patient have difficulty seeing, even when wearing glasses/contacts?: No Does the patient have difficulty concentrating, remembering, or making decisions?: No Patient able to express need for assistance with ADLs?: Yes Does the patient have  difficulty dressing or bathing?: No Independently performs ADLs?: Yes (appropriate for developmental age) Does the patient have difficulty walking or climbing stairs?: No Weakness of Legs: Both Weakness of Arms/Hands: None  Home Assistive Devices/Equipment Home Assistive Devices/Equipment: None    Abuse/Neglect Assessment (Assessment to be complete while patient is alone) Abuse/Neglect Assessment Can Be Completed: Yes Physical Abuse: Denies Verbal Abuse: Denies Sexual Abuse: Denies Exploitation of patient/patient's resources: Denies Self-Neglect: Denies     Regulatory affairs officer (For Healthcare) Does Patient Have a Medical Advance Directive?: No Would patient like information on creating a medical advance directive?: No - Patient declined    Additional Information 1:1 In Past 12 Months?: No CIRT Risk: No Elopement Risk: No Does patient have medical clearance?: Yes     Disposition: Per Lindon Romp, NP pt is recommended for inpt treatment. [facility] is currently reviewing for possible admission. EDP Dr. Gilford Raid, MD aware of disposition.    Disposition Initial Assessment Completed for this Encounter: Yes Disposition of Patient: Inpatient treatment program Type of inpatient treatment program: Adult(per Lindon Romp, NP)  On Site Evaluation by:   Reviewed with Physician:    Lyanne Co 08/23/2017 9:04 PM

## 2017-08-23 NOTE — Telephone Encounter (Signed)
Call placed to [facility] [place].  She noted that the patient was in the ED yesterday and she will be picking up his prescriptions for him today. She explained that she changed his SCAT appointment to Wednesday, 08/28/17. Monday, 08/26/17, the plan is for him to go to [facility] and on Tuesday, 08/27/17 the plan is for him to go to [facility] for intake.

## 2017-08-23 NOTE — ED Notes (Signed)
Bed: RV61 Expected date:  Expected time:  Means of arrival:  Comments: Hold for res

## 2017-08-23 NOTE — ED Triage Notes (Signed)
Per EMS-took 27 different pills, one of which was Trazodone and maybe metoprolol, meloxicam and/or pantoprazole-states patient vomited blood after ingestion-states he is tired of living and wanted to kill himself-patient is from [employer]

## 2017-08-23 NOTE — ED Provider Notes (Signed)
[place] DEPT Provider Note   CSN: [id number] Arrival date & time: 08/23/17  1622     History   Chief Complaint Chief Complaint  Patient presents with  . Drug Overdose    HPI Samuel Reyes is a 62 y.o. male.  Pt presents to the ED today with a drug overdose.  The pt called police and told them he took 20+ pills b/c he was suicidal.  He does not know what he took.  EMS said pill bottles they found appear to have the correct amount of pills.        Past Medical History:  Diagnosis Date  . Alcoholism ([place])   . CAD (coronary artery disease) 2002   MI, no intervention required  . COPD (chronic obstructive pulmonary disease) (HCC)    not on home O2  . Depression   . GERD (gastroesophageal reflux disease)   . Headache   . Hypertension   . Stroke [facility]) 2006    Patient Active Problem List   Diagnosis Date Noted  . Alcohol withdrawal ([place]) 08/08/2017  . COPD with acute bronchitis ([place]) 07/29/2017  . Alcohol use   . Homelessness 06/24/2017  . Lung nodule 09/03/2016  . Tobacco abuse 09/03/2016  . Insomnia 09/03/2016  . HCAP (healthcare-associated pneumonia) 08/22/2016  . Bronchiolitis 08/21/2016  . Malnutrition of moderate degree 08/04/2016  . Atrial tachycardia ([place]) 08/02/2016  . Impaired glucose tolerance 08/02/2016  . Alcohol-induced mood disorder ([place]) 12/12/2015  . Syncope 11/15/2015  . Chest pain 11/15/2015  . Nausea vomiting and diarrhea 11/15/2015  . Abdominal pain 11/15/2015  . Major depressive disorder, recurrent episode, moderate with anxious distress ([place]) 10/30/2015  . Alcohol use disorder, severe, dependence ([place]) 10/29/2015  . COPD exacerbation ([place]) 09/20/2015  . Acute respiratory failure with hypoxia ([place]) 09/18/2015  . Suicidal ideation 09/17/2015  . Alcohol intoxication ([place]) 09/17/2015  . Depression 09/17/2015  . Benign essential HTN 09/17/2015  . Hypokalemia 09/17/2015  . Hyponatremia 09/17/2015  . Coffee  ground emesis 09/17/2015  . COPD (chronic obstructive pulmonary disease) ([place]) 02/17/2013  . GERD (gastroesophageal reflux disease) 02/17/2013    Past Surgical History:  Procedure Laterality Date  . BACK SURGERY     3 cervical spine surgeries C4-C5 fused  . COLONOSCOPY N/A 01/04/2014   Procedure: COLONOSCOPY;  Surgeon: Danie Binder, MD;  [facility];  Service: Endoscopy;  Laterality: N/A;  1:45  . ESOPHAGOGASTRODUODENOSCOPY N/A 01/04/2014   Procedure: ESOPHAGOGASTRODUODENOSCOPY (EGD);  Surgeon: Danie Binder, MD;  Location: [facility];  Service: Endoscopy;  Laterality: N/A;  . FINGER SURGERY Left    2nd, 3rd, & 4th fingers were cut off by table saw and reattached  . GASTRECTOMY    . HERNIA REPAIR    . INCISIONAL HERNIA REPAIR N/A 01/20/2014   Procedure: LAPAROSCOPIC RECURRENT  INCISIONAL HERNIA with mesh;  Surgeon: Edward Jolly, MD;  Location: [facility] ORS;  Service: General;  Laterality: N/A;  . rt knee arthroscopic surgery    . SHOULDER SURGERY Bilateral    3 surgeries on on left, 2 surgeries on right        Home Medications    Prior to Admission medications   Medication Sig Start Date End Date Taking? Authorizing Provider  acetaminophen (TYLENOL) 500 MG tablet Take 1,000 mg by mouth every 6 (six) hours as needed for mild pain, moderate pain, fever or headache.   Yes [provider]  albuterol (PROVENTIL HFA;VENTOLIN HFA) 108 (90 Base) MCG/ACT inhaler  Inhale 1-2 puffs into the lungs every 4 (four) hours as needed for wheezing. 08/29/16  Yes Newlin, Enobong, MD  benzonatate (TESSALON) 100 MG capsule Take 2 capsules (200 mg total) by mouth every 8 (eight) hours. 08/22/17  Yes Khatri, Hina, PA-C  buPROPion (WELLBUTRIN SR) 200 MG 12 hr tablet Take 200 mg by mouth at bedtime. 07/26/17  Yes [provider]  chlordiazePOXIDE (LIBRIUM) 25 MG capsule 50mg  PO TID x 1D, then 25-50mg  PO BID X 1D, then 25-50mg  PO QD X 1D 08/22/17  Yes Khatri, Hina, PA-C    meloxicam (MOBIC) 7.5 MG tablet Take 1 tablet (7.5 mg total) by mouth daily. 07/16/17  Yes Charlott Rakes, MD  metoprolol (LOPRESSOR) 50 MG tablet Take 1 tablet (50 mg total) by mouth 2 (two) times daily. 09/03/16  Yes Charlott Rakes, MD  Multiple Vitamin (MULTIVITAMIN WITH MINERALS) TABS tablet Take 1 tablet by mouth daily. 07/02/17  Yes Barton Dubois, MD  permethrin (ELIMITE) 5 % cream Apply to affected area once 08/23/17  Yes Khatri, Hina, PA-C  predniSONE (DELTASONE) 10 MG tablet Take 4 tablets (40 mg total) by mouth daily for 5 days. 08/22/17 08/27/17 Yes Khatri, Hina, PA-C  SPIRIVA HANDIHALER 18 MCG inhalation capsule Place 1 puff into inhaler and inhale daily. 07/03/17  Yes [provider]  traZODone (DESYREL) 100 MG tablet Take 1 tablet (100 mg total) by mouth at bedtime. 07/01/17  Yes Barton Dubois, MD    Family History Family History  Problem Relation Age of Onset  . Cancer Father        bone  . Cancer Brother        lungs  . Stroke Maternal Grandmother   . Asthma Son        died at age 19 in his sleep   . Spina bifida Son        died at age 34   . Dementia Mother   . Colon cancer Neg Hx     Social History Social History   Tobacco Use  . Smoking status: Current Every Day Smoker    Packs/day: 2.00    Years: 52.00    Pack years: 104.00    Types: Cigarettes    Start date: 07/30/1966  . Smokeless tobacco: Never Used  . Tobacco comment: down to 1/4 ppd  Substance Use Topics  . Alcohol use: Yes    Alcohol/week: 1.8 - 2.4 oz    Types: 3 - 4 Cans of beer per week    Comment: everyday- 3-4 40 oz  . Drug use: No    Comment: denied using any drugs     Allergies   Bee venom and Penicillins   Review of Systems Review of Systems  Psychiatric/Behavioral: Positive for suicidal ideas.  All other systems reviewed and are negative.    Physical Exam Updated Vital Signs BP 122/72   Pulse 72   Temp 98.9 F (37.2 C) (Oral)   Resp 17   SpO2 97%   Physical Exam   Constitutional: He is oriented to person, place, and time. He appears well-developed and well-nourished.  HENT:  Head: Normocephalic and atraumatic.  Right Ear: External ear normal.  Left Ear: External ear normal.  Nose: Nose normal.  Mouth/Throat: Oropharynx is clear and moist.  Eyes: Conjunctivae and EOM are normal. Pupils are equal, round, and reactive to light.  Neck: Normal range of motion. Neck supple.  Cardiovascular: Normal rate, regular rhythm, normal heart sounds and intact distal pulses.  Pulmonary/Chest: Effort normal.  He has wheezes.  Abdominal: Soft. Bowel sounds are normal.  Musculoskeletal: Normal range of motion.  Neurological: He is alert and oriented to person, place, and time.  Skin: Skin is warm and dry. Capillary refill takes less than 2 seconds.  Psychiatric: He has a normal mood and affect. He expresses suicidal ideation. He expresses suicidal plans.  Nursing note and vitals reviewed.    ED Treatments / Results  Labs (all labs ordered are listed, but only abnormal results are displayed) Labs Reviewed  COMPREHENSIVE METABOLIC PANEL - Abnormal; Notable for the following components:      Result Value   Glucose, Bld 61 (*)    Calcium 8.6 (*)    Albumin 3.4 (*)    ALT 13 (*)    Total Bilirubin 0.2 (*)    All other components within normal limits  ACETAMINOPHEN LEVEL - Abnormal; Notable for the following components:   Acetaminophen (Tylenol), Serum <10 (*)    All other components within normal limits  ETHANOL - Abnormal; Notable for the following components:   Alcohol, Ethyl (B) 184 (*)    All other components within normal limits  CBC WITH DIFFERENTIAL/PLATELET - Abnormal; Notable for the following components:   Hemoglobin 10.4 (*)    HCT 32.4 (*)    MCV 76.2 (*)    MCH 24.5 (*)    RDW 19.1 (*)    All other components within normal limits  URINALYSIS, ROUTINE W REFLEX MICROSCOPIC - Abnormal; Notable for the following components:   Color, Urine STRAW  (*)    All other components within normal limits  SALICYLATE LEVEL  RAPID URINE DRUG SCREEN, HOSP PERFORMED  CBG MONITORING, ED    EKG  EKG Interpretation None       Radiology Dg Chest 2 View  Result Date: 08/22/2017 CLINICAL DATA:  Increasing shortness of breath for 1 month. Coughing up blood. EXAM: CHEST  2 VIEW COMPARISON:  08/07/2017 FINDINGS: Again noted are surgical clips in the upper abdomen. Both lungs are clear. Heart and mediastinum are within normal limits. Trachea is midline. No large pleural effusions. Evidence for old fractures involving the anterior right sixth and seventh ribs. IMPRESSION: No active cardiopulmonary disease. Electronically Signed   By: Markus Daft M.D.   On: 08/22/2017 15:29   Ct Angio Chest Pe W/cm &/or Wo Cm  Result Date: 08/22/2017 CLINICAL DATA:  Dyspnea for the past 2-3 weeks worsening today. EXAM: CT ANGIOGRAPHY CHEST WITH CONTRAST TECHNIQUE: Multidetector CT imaging of the chest was performed using the standard protocol during bolus administration of intravenous contrast. Multiplanar CT image reconstructions and MIPs were obtained to evaluate the vascular anatomy. CONTRAST:  1109mL ISOVUE-370 IOPAMIDOL (ISOVUE-370) INJECTION 76% COMPARISON:  06/29/2017 FINDINGS: Cardiovascular: Borderline cardiomegaly without pericardial effusion. Left main and three-vessel coronary arteriosclerosis. Mild aortic atherosclerosis at the arch. No aneurysm or dissection. No pulmonary embolus. Mediastinum/Nodes: No enlarged mediastinal, hilar, or axillary lymph nodes. Thyroid gland, trachea, and esophagus demonstrate no significant findings. Lungs/Pleura: Minimal dependent atelectasis in the right lower lobe. Minimal right apical pleuroparenchymal scarring. No pneumonic consolidation, CHF, effusion or pneumothorax. No dominant mass. Upper Abdomen: Surgical clips at the GE juncture. No acute abnormality. Musculoskeletal: No chest wall abnormality. No acute or significant osseous  findings. Review of the MIP images confirms the above findings. IMPRESSION: 1. No active pulmonary disease. Minimal scarring in the right upper lobe with dependent atelectasis in the right lower lobe. 2. Left main and three-vessel coronary arteriosclerosis. 3. No acute pulmonary embolus. Aortic Atherosclerosis (  ICD10-I70.0). Electronically Signed   By: Ashley Royalty M.D.   On: 08/22/2017 23:33    Procedures Procedures (including critical care time)  Medications Ordered in ED Medications  sodium chloride 0.9 % bolus 1,000 mL (1,000 mLs Intravenous New Bag/Given 08/23/17 1900)    And  0.9 %  sodium chloride infusion (not administered)  LORazepam (ATIVAN) injection 0-4 mg (0 mg Intravenous Not Given 08/23/17 2026)    Or  LORazepam (ATIVAN) tablet 0-4 mg ( Oral See Alternative 08/23/17 2026)  LORazepam (ATIVAN) injection 0-4 mg (not administered)    Or  LORazepam (ATIVAN) tablet 0-4 mg (not administered)  thiamine (VITAMIN B-1) tablet 100 mg (not administered)    Or  thiamine (B-1) injection 100 mg (not administered)  acetaminophen (TYLENOL) tablet 650 mg (not administered)  ondansetron (ZOFRAN) tablet 4 mg (not administered)  alum & mag hydroxide-simeth (MAALOX/MYLANTA) 200-200-20 MG/5ML suspension 30 mL (not administered)  nicotine (NICODERM CQ - dosed in mg/24 hours) patch 21 mg (not administered)  albuterol (PROVENTIL HFA;VENTOLIN HFA) 108 (90 Base) MCG/ACT inhaler 1-2 puff (not administered)  buPROPion (WELLBUTRIN SR) 12 hr tablet 200 mg (not administered)  meloxicam (MOBIC) tablet 7.5 mg (not administered)  multivitamin with minerals tablet 1 tablet (not administered)  permethrin (ELIMITE) 5 % cream (not administered)  tiotropium (SPIRIVA) inhalation capsule 18 mcg (not administered)     Initial Impression / Assessment and Plan / ED Course  I have reviewed the triage vital signs and the nursing notes.  Pertinent labs & imaging results that were available during my care of the patient  were reviewed by me and considered in my medical decision making (see chart for details).    Pt has been observed for hours and vitals have been good.  He is medically clear for TTS consult.  I ordered all home meds except for trazadone (which he supposedly overdosed on) and metoprolol as his bp has been a little low at times.  We can add it back if bp increases.  Final Clinical Impressions(s) / ED Diagnoses   Final diagnoses:  Suicidal ideation  Intentional drug overdose, initial encounter [facility])    ED Discharge Orders    None       Isla Pence, MD 08/23/17 2027

## 2017-08-24 DIAGNOSIS — Z810 Family history of intellectual disabilities: Secondary | ICD-10-CM

## 2017-08-24 DIAGNOSIS — Z736 Limitation of activities due to disability: Secondary | ICD-10-CM

## 2017-08-24 DIAGNOSIS — F17210 Nicotine dependence, cigarettes, uncomplicated: Secondary | ICD-10-CM | POA: Diagnosis not present

## 2017-08-24 DIAGNOSIS — F1014 Alcohol abuse with alcohol-induced mood disorder: Secondary | ICD-10-CM

## 2017-08-24 NOTE — Patient Outreach (Signed)
ED Peer Support Specialist Patient Intake (Complete at intake & 30-60 Day Follow-up)  Name: Samuel Reyes  MRN: [id number]  Age: 62 y.o.   Date of Admission: 08/24/2017  Intake: Initial Comments:      Primary Reason Admitted: pt intentionally ingested an unknown number of pills PTA. It is unclear how many pills the pt actually took. Chart states the pt may have taken more than 20 pills, however triage note reports the pt admitted to taking 10 Trazodone pills. Pt does not disclose to this writer how many pills he actually took, however he does admit that he attempted to kill himself by "taking a bunch of pills."   Pt states he is suicidal because he is alone. Pt states his wife and both of his sons passed away and he now has nothing to live for. Pt is homeless and has been staying at [facility]. Pt is a heavy alcohol drinker and admits to consuming up to 5 40oz beers daily. Pt's current BAL is 184 on arrival to ED.   Per chart, pt was recently evaluated by TTS on 08/07/17 and instructed to follow up with OPT services including [facility]. Pt states he has not followed up with any OPT providers and does not disclose the barriers that are prohibiting him from seeking OPT treatment.     Lab values: Alcohol/ETOH: Positive Positive UDS?   Amphetamines:   Barbiturates:   Benzodiazepines:   Cocaine:   Opiates:   Cannabinoids:    Demographic information: Gender:   Ethnicity:   Marital Status:   Insurance Status:   Ecologist (Work Neurosurgeon, Physicist, medical, etc.:   Lives with:   Living situation:    Reported Patient History: Patient reported health conditions:   Patient aware of HIV and hepatitis status:    In past year, has patient visited ED for any reason?    Number of ED visits:    Reason(s) for visit:    In past year, has patient been hospitalized for any reason?    Number of hospitalizations:    Reason(s) for  hospitalization:    In past year, has patient been arrested?    Number of arrests:    Reason(s) for arrest:    In past year, has patient been incarcerated?    Number of incarcerations:    Reason(s) for incarceration:    In past year, has patient received medication-assisted treatment?    In past year, patient received the following treatments:    In past year, has patient received any harm reduction services?    Did this include any of the following?    In past year, has patient received care from a mental health provider for diagnosis other than SUD?    In past year, is this first time patient has overdosed?    Number of past overdoses:    In past year, is this first time patient has been hospitalized for an overdose?    Number of hospitalizations for overdose(s):    Is patient currently receiving treatment for a mental health diagnosis?    Patient reports experiencing difficulty participating in SUD treatment:      Most important reason(s) for this difficulty?    Has patient received prior services for treatment?    In past, patient has received services from following agencies:    Plan of Care:  Suggested follow up at these agencies/treatment centers:    Other information: CPSS met with Pt to monitor services and  for motivational interviewing. CPSS talked with Pt to see what is going on to make sure that Pt has a plan to better the quality of his life. CPSS was made aware that he was not in a happy place because of the issues that he was dealing with at he Homeless shelter. CPSS addressed the fact that he needs to work on bettering the quality of his life. CPSS mentioned to Pt that he needs to follow his personal plan an stay focus on his goals. CPSS is working on finding placement.    Aaron Edelman Isael Stille, CPSS  08/24/2017 11:31 AM

## 2017-08-24 NOTE — ED Notes (Addendum)
Pt pleasant and cooperative with care. No distress noted at this time. Pt with sitter at bedside for safety. Pt denies SI or plans to harm himself at this time.

## 2017-08-24 NOTE — Consult Note (Signed)
[facility] Consult   Reason for Consult:  Alcohol abuse with suicidal ideations Referring Physician:  EDP Patient Identification: Samuel Reyes MRN:  [id number] Principal Diagnosis: Alcohol abuse with alcohol-induced mood disorder [facility]) Diagnosis:   Patient Active Problem List   Diagnosis Date Noted  . Alcohol abuse with alcohol-induced mood disorder ([place]) [F10.14] 12/12/2015    Priority: High  . Alcohol use disorder, severe, dependence ([place]) [F10.20] 10/29/2015    Priority: High  . Alcohol withdrawal ([place]) [F10.239] 08/08/2017  . COPD with acute bronchitis ([place]) [J44.0, J20.9] 07/29/2017  . Alcohol use [Z78.9]   . Homelessness [Z59.0] 06/24/2017  . Lung nodule [R91.1] 09/03/2016  . Tobacco abuse [Z72.0] 09/03/2016  . Insomnia [G47.00] 09/03/2016  . HCAP (healthcare-associated pneumonia) [J18.9] 08/22/2016  . Bronchiolitis [J21.9] 08/21/2016  . Malnutrition of moderate degree [E44.0] 08/04/2016  . Atrial tachycardia ([place]) [I47.1] 08/02/2016  . Impaired glucose tolerance [R73.02] 08/02/2016  . Syncope [R55] 11/15/2015  . Chest pain [R07.9] 11/15/2015  . Nausea vomiting and diarrhea [R11.2, R19.7] 11/15/2015  . Abdominal pain [R10.9] 11/15/2015  . Major depressive disorder, recurrent episode, moderate with anxious distress ([place]) [F33.1] 10/30/2015  . COPD exacerbation ([place]) [J44.1] 09/20/2015  . Acute respiratory failure with hypoxia ([place]) [J96.01] 09/18/2015  . Suicidal ideation [R45.851] 09/17/2015  . Alcohol intoxication ([place]) [F10.929] 09/17/2015  . Depression [F32.9] 09/17/2015  . Benign essential HTN [I10] 09/17/2015  . Hypokalemia [E87.6] 09/17/2015  . Hyponatremia [E87.1] 09/17/2015  . Coffee ground emesis [K92.0] 09/17/2015  . COPD (chronic obstructive pulmonary disease) ([place]) [J44.9] 02/17/2013  . GERD (gastroesophageal reflux disease) [K21.9] 02/17/2013    Total Time spent with patient: 45 minutes  Subjective:   Samuel Reyes is a 62 y.o. male  patient will be started on the detox protocol and monitored for stability for discharge in the am..  HPI:  62 yo male who presented to the ED under alcohol intoxication with suicidal ideations.  On assessment, he denies suicidal ideations along with homicidal ideations, hallucinations.  Some tremors and sweats on assessment.  Ativan alcohol detox started.  Peer support consult placed.  Past Psychiatric History: alcohol dependence, depression  Risk to Self: Suicidal Ideation: Yes-Currently Present Suicidal Intent: Yes-Currently Present Is patient at risk for suicide?: Yes Suicidal Plan?: Yes-Currently Present Specify Current Suicidal Plan: pt reports PTA to the ED he intentionally ingested 10 Trazdone Access to Means: Yes Specify Access to Suicidal Means: pt has access to medication What has been your use of drugs/alcohol within the last 12 months?: reports to daily alcohol use  How many times?: 1 Triggers for Past Attempts: Family contact, Other personal contacts, Spouse contact Intentional Self Injurious Behavior: None Risk to Others: Homicidal Ideation: No Thoughts of Harm to Others: No Current Homicidal Intent: No Current Homicidal Plan: No Access to Homicidal Means: No History of harm to others?: No Assessment of Violence: None Noted Does patient have access to weapons?: Yes (Comment)(pt says he owns guns ) Criminal Charges Pending?: No Does patient have a court date: No Prior Inpatient Therapy: Prior Inpatient Therapy: Yes Prior Therapy Dates: multiple (last time 04/2017) Prior Therapy Facilty/Provider(s): multiple (last time was at [facility]) Reason for Treatment: depression Prior Outpatient Therapy: Prior Outpatient Therapy: Yes Prior Therapy Dates: pt reports "years ago" Prior Therapy Facilty/Provider(s): [facility] Reason for Treatment: MED MANAGEMENT Does patient have an ACCT team?: No Does patient have Intensive In-House Services?  : No Does patient have [facility]  services? : No Does patient have P4CC services?: No  Past Medical  History:  Past Medical History:  Diagnosis Date  . Alcoholism ([place])   . CAD (coronary artery disease) 2002   MI, no intervention required  . COPD (chronic obstructive pulmonary disease) (H[facility])    not on home O2  . Depression   . GERD (gastroesophageal reflux disease)   . Headache   . Hypertension   . Stroke [facility]) 2006    Past Surgical History:  Procedure Laterality Date  . BACK SURGERY     3 cervical spine surgeries C4-C5 fused  . COLONOSCOPY N/A 01/04/2014   Procedure: COLONOSCOPY;  Surgeon: Danie Binder, MD;  Location: [facility] ENDO SUITE;  Service: Endoscopy;  Laterality: N/A;  1:45  . ESOPHAGOGASTRODUODENOSCOPY N/A 01/04/2014   Procedure: ESOPHAGOGASTRODUODENOSCOPY (EGD);  Surgeon: Danie Binder, MD;  Location: [facility] ENDO SUITE;  Service: Endoscopy;  Laterality: N/A;  . FINGER SURGERY Left    2nd, 3rd, & 4th fingers were cut off by table saw and reattached  . GASTRECTOMY    . HERNIA REPAIR    . INCISIONAL HERNIA REPAIR N/A 01/20/2014   Procedure: LAPAROSCOPIC RECURRENT  INCISIONAL HERNIA with mesh;  Surgeon: Edward Jolly, MD;  Location: [facility] ORS;  Service: General;  Laterality: N/A;  . rt knee arthroscopic surgery    . SHOULDER SURGERY Bilateral    3 surgeries on on left, 2 surgeries on right    Family History:  Family History  Problem Relation Age of Onset  . Cancer Father        bone  . Cancer Brother        lungs  . Stroke Maternal Grandmother   . Asthma Son        died at age 68 in his sleep   . Spina bifida Son        died at age 49   . Dementia Mother   . Colon cancer Neg Hx    Family Psychiatric  History: none Social History:  Social History   Substance and Sexual Activity  Alcohol Use Yes  . Alcohol/week: 1.8 - 2.4 oz  . Types: 3 - 4 Cans of beer per week   Comment: everyday- 3-4 40 oz     Social History   Substance and Sexual Activity  Drug Use No   Comment: denied using any drugs     Social History   Socioeconomic History  . Marital status: Widowed    Spouse name: Not on file  . Number of children: Not on file  . Years of education: Not on file  . Highest education level: Not on file  Social Needs  . Financial resource strain: Not on file  . Food insecurity - worry: Not on file  . Food insecurity - inability: Not on file  . Transportation needs - medical: Not on file  . Transportation needs - non-medical: Not on file  Occupational History  . Occupation: Disability  Tobacco Use  . Smoking status: Current Every Day Smoker    Packs/day: 2.00    Years: 52.00    Pack years: 104.00    Types: Cigarettes    Start date: 07/30/1966  . Smokeless tobacco: Never Used  . Tobacco comment: down to 1/4 ppd  Substance and Sexual Activity  . Alcohol use: Yes    Alcohol/week: 1.8 - 2.4 oz    Types: 3 - 4 Cans of beer per week    Comment: everyday- 3-4 40 oz  . Drug use: No    Comment: denied using any drugs  .  Sexual activity: No  Other Topics Concern  . Not on file  Social History Narrative  . Not on file   Additional Social History:    Allergies:   Allergies  Allergen Reactions  . Bee Venom Anaphylaxis  . Penicillins Rash    Has patient had a PCN reaction causing immediate rash, facial/tongue/throat swelling, SOB or lightheadedness with hypotension: {Yes Has patient had a PCN reaction causing severe rash involving mucus membranes or skin necrosis: YES Has patient had a PCN reaction that required hospitalization Yes Has patient had a PCN reaction occurring within the last 10 years: YES If all of the above answers are "NO", then may proceed with Cephalosporin use.     Labs:  Results for orders placed or performed during the hospital encounter of 08/23/17 (from the past 48 hour(s))  Comprehensive metabolic panel     Status: Abnormal   Collection Time: 08/23/17  4:55 PM  Result Value Ref Range   Sodium 135 135 - 145 mmol/L   Potassium 3.7 3.5 - 5.1 mmol/L    Chloride 104 101 - 111 mmol/L   CO2 24 22 - 32 mmol/L   Glucose, Bld 61 (L) 65 - 99 mg/dL   BUN 14 6 - 20 mg/dL   Creatinine, Ser 0.73 0.61 - 1.24 mg/dL   Calcium 8.6 (L) 8.9 - 10.3 mg/dL   Total Protein 6.9 6.5 - 8.1 g/dL   Albumin 3.4 (L) 3.5 - 5.0 g/dL   AST 17 15 - 41 U/L   ALT 13 (L) 17 - 63 U/L   Alkaline Phosphatase 72 38 - 126 U/L   Total Bilirubin 0.2 (L) 0.3 - 1.2 mg/dL   GFR calc non Af Amer >60 >60 mL/min   GFR calc Af Amer >60 >60 mL/min    Comment: (NOTE) The eGFR has been calculated using the CKD EPI equation. This calculation has not been validated in all clinical situations. eGFR's persistently <60 mL/min signify possible Chronic Kidney Disease.    Anion gap 7 5 - 15  Salicylate level     Status: None   Collection Time: 08/23/17  4:55 PM  Result Value Ref Range   Salicylate Lvl <2.9 2.8 - 30.0 mg/dL  Acetaminophen level     Status: Abnormal   Collection Time: 08/23/17  4:55 PM  Result Value Ref Range   Acetaminophen (Tylenol), Serum <10 (L) 10 - 30 ug/mL    Comment:        THERAPEUTIC CONCENTRATIONS VARY SIGNIFICANTLY. A RANGE OF 10-30 ug/mL MAY BE AN EFFECTIVE CONCENTRATION FOR MANY PATIENTS. HOWEVER, SOME ARE BEST TREATED AT CONCENTRATIONS OUTSIDE THIS RANGE. ACETAMINOPHEN CONCENTRATIONS >150 ug/mL AT 4 HOURS AFTER INGESTION AND >50 ug/mL AT 12 HOURS AFTER INGESTION ARE OFTEN ASSOCIATED WITH TOXIC REACTIONS.   Ethanol     Status: Abnormal   Collection Time: 08/23/17  4:55 PM  Result Value Ref Range   Alcohol, Ethyl (B) 184 (H) <10 mg/dL    Comment:        LOWEST DETECTABLE LIMIT FOR SERUM ALCOHOL IS 10 mg/dL FOR MEDICAL PURPOSES ONLY   CBC WITH DIFFERENTIAL     Status: Abnormal   Collection Time: 08/23/17  4:55 PM  Result Value Ref Range   WBC 10.2 4.0 - 10.5 K/uL   RBC 4.25 4.22 - 5.81 MIL/uL   Hemoglobin 10.4 (L) 13.0 - 17.0 g/dL   HCT 32.4 (L) 39.0 - 52.0 %   MCV 76.2 (L) 78.0 - 100.0 fL   MCH 24.5 (  L) 26.0 - 34.0 pg   MCHC 32.1 30.0  - 36.0 g/dL   RDW 19.1 (H) 11.5 - 15.5 %   Platelets 374 150 - 400 K/uL   Neutrophils Relative % 68 %   Neutro Abs 6.9 1.7 - 7.7 K/uL   Lymphocytes Relative 22 %   Lymphs Abs 2.2 0.7 - 4.0 K/uL   Monocytes Relative 10 %   Monocytes Absolute 1.0 0.1 - 1.0 K/uL   Eosinophils Relative 0 %   Eosinophils Absolute 0.0 0.0 - 0.7 K/uL   Basophils Relative 0 %   Basophils Absolute 0.0 0.0 - 0.1 K/uL  Urine rapid drug screen (hosp performed)     Status: None   Collection Time: 08/23/17  5:01 PM  Result Value Ref Range   Opiates NONE DETECTED NONE DETECTED   Cocaine NONE DETECTED NONE DETECTED   Benzodiazepines NONE DETECTED NONE DETECTED   Amphetamines NONE DETECTED NONE DETECTED   Tetrahydrocannabinol NONE DETECTED NONE DETECTED   Barbiturates NONE DETECTED NONE DETECTED    Comment: (NOTE) DRUG SCREEN FOR MEDICAL PURPOSES ONLY.  IF CONFIRMATION IS NEEDED FOR ANY PURPOSE, NOTIFY LAB WITHIN 5 DAYS. LOWEST DETECTABLE LIMITS FOR URINE DRUG SCREEN Drug Class                     Cutoff (ng/mL) Amphetamine and metabolites    1000 Barbiturate and metabolites    200 Benzodiazepine                 696 Tricyclics and metabolites     300 Opiates and metabolites        300 Cocaine and metabolites        300 THC                            50   Urinalysis, Routine w reflex microscopic     Status: Abnormal   Collection Time: 08/23/17  5:01 PM  Result Value Ref Range   Color, Urine STRAW (A) YELLOW   APPearance CLEAR CLEAR   Specific Gravity, Urine 1.006 1.005 - 1.030   pH 6.0 5.0 - 8.0   Glucose, UA NEGATIVE NEGATIVE mg/dL   Hgb urine dipstick NEGATIVE NEGATIVE   Bilirubin Urine NEGATIVE NEGATIVE   Ketones, ur NEGATIVE NEGATIVE mg/dL   Protein, ur NEGATIVE NEGATIVE mg/dL   Nitrite NEGATIVE NEGATIVE   Leukocytes, UA NEGATIVE NEGATIVE  CBG monitoring, ED     Status: None   Collection Time: 08/23/17  7:09 PM  Result Value Ref Range   Glucose-Capillary 83 65 - 99 mg/dL    Current  Facility-Administered Medications  Medication Dose Route Frequency Provider Last Rate Last Dose  . 0.9 %  sodium chloride infusion   Intravenous Continuous Isla Pence, MD   Stopped at 08/24/17 0003  . acetaminophen (TYLENOL) tablet 650 mg  650 mg Oral Q4H PRN Isla Pence, MD   650 mg at 08/23/17 2036  . albuterol (PROVENTIL HFA;VENTOLIN HFA) 108 (90 Base) MCG/ACT inhaler 1-2 puff  1-2 puff Inhalation Q4H PRN Isla Pence, MD      . alum & mag hydroxide-simeth (MAALOX/MYLANTA) 200-200-20 MG/5ML suspension 30 mL  30 mL Oral Q6H PRN Isla Pence, MD      . buPROPion [facility] SR) 12 hr tablet 200 mg  200 mg Oral QHS Isla Pence, MD   200 mg at 08/24/17 0002  . LORazepam (ATIVAN) injection 0-4 mg  0-4 mg Intravenous Q6H  Isla Pence, MD   1 mg at 08/23/17 2026   Or  . LORazepam (ATIVAN) tablet 0-4 mg  0-4 mg Oral Q6H Isla Pence, MD   1 mg at 08/24/17 0857  . [START ON 08/26/2017] LORazepam (ATIVAN) injection 0-4 mg  0-4 mg Intravenous Q12H Isla Pence, MD       Or  . Derrill Memo ON 08/26/2017] LORazepam (ATIVAN) tablet 0-4 mg  0-4 mg Oral Q12H Isla Pence, MD      . multivitamin with minerals tablet 1 tablet  1 tablet Oral Daily Isla Pence, MD   1 tablet at 08/24/17 1007  . nicotine (NICODERM CQ - dosed in mg/24 hours) patch 21 mg  21 mg Transdermal Daily Isla Pence, MD   21 mg at 08/24/17 1008  . ondansetron (ZOFRAN) tablet 4 mg  4 mg Oral Q8H PRN Isla Pence, MD      . thiamine (VITAMIN B-1) tablet 100 mg  100 mg Oral Daily Isla Pence, MD   100 mg at 08/24/17 1007   Or  . thiamine (B-1) injection 100 mg  100 mg Intravenous Daily Isla Pence, MD      . tiotropium [facility]) inhalation capsule 18 mcg  18 mcg Inhalation Daily Isla Pence, MD   18 mcg at 08/24/17 1106   Current Outpatient Medications  Medication Sig Dispense Refill  . acetaminophen (TYLENOL) 500 MG tablet Take 1,000 mg by mouth every 6 (six) hours as needed for mild pain,  moderate pain, fever or headache.    . albuterol (PROVENTIL HFA;VENTOLIN HFA) 108 (90 Base) MCG/ACT inhaler Inhale 1-2 puffs into the lungs every 4 (four) hours as needed for wheezing. 1 Inhaler 1  . benzonatate (TESSALON) 100 MG capsule Take 2 capsules (200 mg total) by mouth every 8 (eight) hours. 21 capsule 0  . buPROPion (WELLBUTRIN SR) 200 MG 12 hr tablet Take 200 mg by mouth at bedtime.  0  . chlordiazePOXIDE (LIBRIUM) 25 MG capsule 67m PO TID x 1D, then 25-544mPO BID X 1D, then 25-5072mO QD X 1D 10 capsule 0  . meloxicam (MOBIC) 7.5 MG tablet Take 1 tablet (7.5 mg total) by mouth daily. 30 tablet 1  . metoprolol (LOPRESSOR) 50 MG tablet Take 1 tablet (50 mg total) by mouth 2 (two) times daily. 60 tablet 1  . Multiple Vitamin (MULTIVITAMIN WITH MINERALS) TABS tablet Take 1 tablet by mouth daily. 30 tablet 1  . permethrin (ELIMITE) 5 % cream Apply to affected area once 60 g 0  . predniSONE (DELTASONE) 10 MG tablet Take 4 tablets (40 mg total) by mouth daily for 5 days. 20 tablet 0  . SPIRIVA HANDIHALER 18 MCG inhalation capsule Place 1 puff into inhaler and inhale daily.  0  . traZODone (DESYREL) 100 MG tablet Take 1 tablet (100 mg total) by mouth at bedtime.      Musculoskeletal: Strength & Muscle Tone: within normal limits Gait & Station: normal Patient leans: N/A  Psychiatric Specialty Exam: Physical Exam  Constitutional: He is oriented to person, place, and time. He appears well-developed and well-nourished.  HENT:  Head: Normocephalic.  Neck: Normal range of motion.  Respiratory: Effort normal.  Musculoskeletal: Normal range of motion.  Neurological: He is alert and oriented to person, place, and time.  Psychiatric: His speech is normal and behavior is normal. Judgment and thought content normal. Cognition and memory are normal. He exhibits a depressed mood.    Review of Systems  Psychiatric/Behavioral: Positive for depression and substance abuse.  All other systems  reviewed and are negative.   Blood pressure 125/66, pulse (!) 111, temperature 98.8 F (37.1 C), temperature source Oral, resp. rate 18, SpO2 94 %.There is no height or weight on file to calculate BMI.  General Appearance: Disheveled  Eye Contact:  Fair  Speech:  Normal Rate  Volume:  Normal  Mood:  Depressed, mild  Affect:  Congruent  Thought Process:  Coherent and Descriptions of Associations: Intact  Orientation:  Full (Time, Place, and Person)  Thought Content:  WDL and Logical  Suicidal Thoughts:  No  Homicidal Thoughts:  No  Memory:  Immediate;   Fair Recent;   Poor Remote;   Fair  Judgement:  Fair  Insight:  Fair  Psychomotor Activity:  Decreased  Concentration:  Concentration: Fair and Attention Span: Fair  Recall:  [employer] of Knowledge:  Fair  Language:  Good  Akathisia:  No  Handed:  Right  AIMS (if indicated):     Assets:  Leisure Time Physical Health Resilience  ADL's:  Intact  Cognition:  WNL  Sleep:        Treatment Plan Summary: Daily contact with patient to assess and evaluate symptoms and progress in treatment, Medication management and Plan alcohol abuse with alcohol induced mood disorder:  -Crisis stabilization -Medication management:  Ativan alcohol detox protocol and Wellbutrin 200 mg daily for depression -Individual and substance abuse counseling -Peer support consult  Disposition: Supportive therapy provided about ongoing stressors.  Waylan Boga, NP 08/24/2017 4:20 PM  Patient seen face-to-face for psychiatric evaluation, chart reviewed and case discussed with the physician extender and developed treatment plan. Reviewed the information documented and agree with the treatment plan. Corena Pilgrim, MD

## 2017-08-25 MED ORDER — HYDROXYZINE HCL 25 MG PO TABS
25.0000 mg | ORAL_TABLET | Freq: Four times a day (QID) | ORAL | Status: DC | PRN
  Administered 2017-08-25 – 2017-08-26 (×2): 25 mg via ORAL
  Filled 2017-08-25 (×2): qty 1

## 2017-08-25 NOTE — ED Notes (Signed)
Patient awake and alert and oriented, warm and dry, in no acute distress. Patient denies SI, HI, AVH and pain. Patient made aware of Q15 minute rounds and security cameras for their safety. Patient instructed to come to me with needs or concerns.

## 2017-08-25 NOTE — ED Notes (Signed)
Hourly rounding reveals patient in room. No complaints, stable, in no acute distress. Q15 minute rounds and monitoring via Security Cameras to continue. 

## 2017-08-25 NOTE — ED Notes (Signed)
Escorted from TCU directly from shower room following parasite treatment.  Belongings double bagged and to locker 2.

## 2017-08-25 NOTE — ED Notes (Signed)
Bed: [facility] Expected date:  Expected time:  Means of arrival:  Comments: HOLD FOR 27

## 2017-08-25 NOTE — ED Notes (Signed)
Report to include situation, background, assessment and recommendations from [facility]. Patient sleeping, respirations regular and unlabored. Q15 minute rounds and security camera observation to continue.

## 2017-08-25 NOTE — ED Notes (Signed)
Hourly rounding reveals patient in room. Pt. C/o itching, stable, in no acute distress. Q15 minute rounds and monitoring via [employer] to continue.

## 2017-08-25 NOTE — ED Notes (Signed)
Denies SI.  States he "was just having a hard time with the losses of his wife and 2 sons."  Alert,oriented and with good hygiene.  Support offered. 15' checks initiated.

## 2017-08-25 NOTE — ED Notes (Signed)
Hourly rounding reveals patient sleeping in room. No complaints, stable, in no acute distress. Q15 minute rounds and monitoring via Security Cameras to continue. 

## 2017-08-25 NOTE — ED Notes (Signed)
TTS AT BEDSIDE 

## 2017-08-25 NOTE — BH Assessment (Addendum)
[facility] Assessment Progress Note  Pt re-assessed this morning. Pt denies SI, HI, AVH. Dr. Darleene Cleaver recommends pt be held overnight and d/c tomorrow.   Kenna Gilbert. Lovena Le, [place], [place], LPCA Counselor

## 2017-08-25 NOTE — ED Notes (Addendum)
PER CHARGE PATTI D RN. ONCE TREATED WITH CREAM POST 8 HOURS THEN SHOWERED PT HAS BEEN BEEN TREATED AND NO LONGER NEEDING CONTACT. PT WILL BE TRANSFERRED TO [facility]. LU ANN RN MADE AWARE AND REPORT GIVEN

## 2017-08-26 ENCOUNTER — Other Ambulatory Visit: Payer: Self-pay

## 2017-08-26 ENCOUNTER — Inpatient Hospital Stay (HOSPITAL_COMMUNITY)
Admission: AD | Admit: 2017-08-26 | Discharge: 2017-08-30 | DRG: 885 | Disposition: A | Discharge status: Home / Self Care | Payer: Medicaid Other | Source: Intra-hospital | Attending: Psychiatry | Admitting: Psychiatry

## 2017-08-26 ENCOUNTER — Encounter (HOSPITAL_COMMUNITY): Payer: Self-pay | Admitting: *Deleted

## 2017-08-26 ENCOUNTER — Telehealth: Payer: Self-pay | Admitting: Internal Medicine

## 2017-08-26 DIAGNOSIS — K219 Gastro-esophageal reflux disease without esophagitis: Secondary | ICD-10-CM | POA: Diagnosis present

## 2017-08-26 DIAGNOSIS — Z634 Disappearance and death of family member: Secondary | ICD-10-CM | POA: Diagnosis not present

## 2017-08-26 DIAGNOSIS — Z981 Arthrodesis status: Secondary | ICD-10-CM | POA: Diagnosis not present

## 2017-08-26 DIAGNOSIS — F1014 Alcohol abuse with alcohol-induced mood disorder: Secondary | ICD-10-CM | POA: Diagnosis not present

## 2017-08-26 DIAGNOSIS — Z880 Allergy status to penicillin: Secondary | ICD-10-CM

## 2017-08-26 DIAGNOSIS — F10229 Alcohol dependence with intoxication, unspecified: Secondary | ICD-10-CM | POA: Diagnosis present

## 2017-08-26 DIAGNOSIS — I10 Essential (primary) hypertension: Secondary | ICD-10-CM | POA: Diagnosis present

## 2017-08-26 DIAGNOSIS — T1491XA Suicide attempt, initial encounter: Secondary | ICD-10-CM | POA: Diagnosis not present

## 2017-08-26 DIAGNOSIS — G4700 Insomnia, unspecified: Secondary | ICD-10-CM | POA: Diagnosis present

## 2017-08-26 DIAGNOSIS — I2510 Atherosclerotic heart disease of native coronary artery without angina pectoris: Secondary | ICD-10-CM | POA: Diagnosis present

## 2017-08-26 DIAGNOSIS — F332 Major depressive disorder, recurrent severe without psychotic features: Principal | ICD-10-CM | POA: Diagnosis present

## 2017-08-26 DIAGNOSIS — Z808 Family history of malignant neoplasm of other organs or systems: Secondary | ICD-10-CM

## 2017-08-26 DIAGNOSIS — Z825 Family history of asthma and other chronic lower respiratory diseases: Secondary | ICD-10-CM

## 2017-08-26 DIAGNOSIS — F1024 Alcohol dependence with alcohol-induced mood disorder: Secondary | ICD-10-CM | POA: Diagnosis present

## 2017-08-26 DIAGNOSIS — Z823 Family history of stroke: Secondary | ICD-10-CM | POA: Diagnosis not present

## 2017-08-26 DIAGNOSIS — J449 Chronic obstructive pulmonary disease, unspecified: Secondary | ICD-10-CM | POA: Diagnosis present

## 2017-08-26 DIAGNOSIS — Z590 Homelessness: Secondary | ICD-10-CM | POA: Diagnosis not present

## 2017-08-26 DIAGNOSIS — Z801 Family history of malignant neoplasm of trachea, bronchus and lung: Secondary | ICD-10-CM

## 2017-08-26 DIAGNOSIS — Z736 Limitation of activities due to disability: Secondary | ICD-10-CM | POA: Diagnosis not present

## 2017-08-26 DIAGNOSIS — T50902A Poisoning by unspecified drugs, medicaments and biological substances, intentional self-harm, initial encounter: Secondary | ICD-10-CM

## 2017-08-26 DIAGNOSIS — F17210 Nicotine dependence, cigarettes, uncomplicated: Secondary | ICD-10-CM | POA: Diagnosis present

## 2017-08-26 DIAGNOSIS — G629 Polyneuropathy, unspecified: Secondary | ICD-10-CM | POA: Diagnosis not present

## 2017-08-26 DIAGNOSIS — Z818 Family history of other mental and behavioral disorders: Secondary | ICD-10-CM

## 2017-08-26 DIAGNOSIS — Z79899 Other long term (current) drug therapy: Secondary | ICD-10-CM

## 2017-08-26 DIAGNOSIS — J441 Chronic obstructive pulmonary disease with (acute) exacerbation: Secondary | ICD-10-CM | POA: Diagnosis not present

## 2017-08-26 DIAGNOSIS — Z8489 Family history of other specified conditions: Secondary | ICD-10-CM

## 2017-08-26 DIAGNOSIS — F1099 Alcohol use, unspecified with unspecified alcohol-induced disorder: Secondary | ICD-10-CM | POA: Diagnosis not present

## 2017-08-26 DIAGNOSIS — Z91030 Bee allergy status: Secondary | ICD-10-CM | POA: Diagnosis not present

## 2017-08-26 DIAGNOSIS — Z8673 Personal history of transient ischemic attack (TIA), and cerebral infarction without residual deficits: Secondary | ICD-10-CM

## 2017-08-26 DIAGNOSIS — R911 Solitary pulmonary nodule: Secondary | ICD-10-CM | POA: Diagnosis present

## 2017-08-26 DIAGNOSIS — Z810 Family history of intellectual disabilities: Secondary | ICD-10-CM | POA: Diagnosis not present

## 2017-08-26 DIAGNOSIS — Y906 Blood alcohol level of 120-199 mg/100 ml: Secondary | ICD-10-CM | POA: Diagnosis present

## 2017-08-26 DIAGNOSIS — F39 Unspecified mood [affective] disorder: Secondary | ICD-10-CM | POA: Diagnosis not present

## 2017-08-26 DIAGNOSIS — F1010 Alcohol abuse, uncomplicated: Secondary | ICD-10-CM | POA: Diagnosis not present

## 2017-08-26 MED ORDER — TIOTROPIUM BROMIDE MONOHYDRATE 18 MCG IN CAPS
18.0000 ug | ORAL_CAPSULE | Freq: Every day | RESPIRATORY_TRACT | Status: DC
  Administered 2017-08-27 – 2017-08-29 (×3): 18 ug via RESPIRATORY_TRACT
  Filled 2017-08-26 (×2): qty 5

## 2017-08-26 MED ORDER — NICOTINE 21 MG/24HR TD PT24
21.0000 mg | MEDICATED_PATCH | Freq: Every day | TRANSDERMAL | Status: DC
  Administered 2017-08-27 – 2017-08-29 (×3): 21 mg via TRANSDERMAL
  Filled 2017-08-26 (×6): qty 1

## 2017-08-26 MED ORDER — TRAZODONE HCL 50 MG PO TABS
50.0000 mg | ORAL_TABLET | Freq: Every evening | ORAL | Status: DC | PRN
  Administered 2017-08-26 – 2017-08-29 (×4): 50 mg via ORAL
  Filled 2017-08-26: qty 1
  Filled 2017-08-26: qty 14
  Filled 2017-08-26: qty 1

## 2017-08-26 MED ORDER — ALBUTEROL SULFATE HFA 108 (90 BASE) MCG/ACT IN AERS
1.0000 | INHALATION_SPRAY | RESPIRATORY_TRACT | Status: DC | PRN
  Filled 2017-08-26: qty 6.7

## 2017-08-26 MED ORDER — HYDROXYZINE HCL 25 MG PO TABS
25.0000 mg | ORAL_TABLET | Freq: Four times a day (QID) | ORAL | Status: DC | PRN
  Administered 2017-08-26 – 2017-08-29 (×11): 25 mg via ORAL
  Filled 2017-08-26: qty 1
  Filled 2017-08-26: qty 20
  Filled 2017-08-26 (×2): qty 1
  Filled 2017-08-26: qty 3
  Filled 2017-08-26 (×3): qty 1
  Filled 2017-08-26: qty 4
  Filled 2017-08-26 (×4): qty 1

## 2017-08-26 MED ORDER — MAGNESIUM HYDROXIDE 400 MG/5ML PO SUSP: 30.0000 mL | Freq: Every day | ORAL | Status: DC | PRN

## 2017-08-26 MED ORDER — ALUM & MAG HYDROXIDE-SIMETH 200-200-20 MG/5ML PO SUSP: 30.0000 mL | ORAL | Status: DC | PRN

## 2017-08-26 MED ORDER — ACETAMINOPHEN 325 MG PO TABS
650.0000 mg | ORAL_TABLET | Freq: Four times a day (QID) | ORAL | Status: DC | PRN
  Administered 2017-08-26 – 2017-08-30 (×8): 650 mg via ORAL
  Filled 2017-08-26 (×2): qty 2
  Filled 2017-08-26: qty 4
  Filled 2017-08-26 (×6): qty 2

## 2017-08-26 MED ORDER — BUPROPION HCL ER (SR) 100 MG PO TB12
200.0000 mg | ORAL_TABLET | Freq: Every day | ORAL | Status: AC
  Administered 2017-08-26 – 2017-08-27 (×2): 200 mg via ORAL
  Filled 2017-08-26 (×4): qty 2

## 2017-08-26 MED ORDER — ENSURE ENLIVE PO LIQD: 237.0000 mL | Freq: Two times a day (BID) | ORAL | Status: DC

## 2017-08-26 NOTE — ED Notes (Signed)
Hourly rounding reveals patient in room. No complaints, stable, in no acute distress. Q15 minute rounds and monitoring via Security Cameras to continue. 

## 2017-08-26 NOTE — ED Notes (Signed)
Hourly rounding reveals patient sleeping in room. No complaints, stable, in no acute distress. Q15 minute rounds and monitoring via Security Cameras to continue. 

## 2017-08-26 NOTE — Telephone Encounter (Signed)
-----   Message from Ethelene Hal, NP sent at 08/26/2017  1:24 PM EST ----- Regarding: Order for [facility]  Patient Name: Samuel Reyes, Samuel Reyes) Sex: Male DOB: 08-23-1955    PCP: Charlott Rakes   Center: [facility]   Types of orders made on 08/26/2017: Admission, Code Status, Consult, Diet,                                      Medications, My Chart Restriction, Nursing  Order Date:08/26/2017 Ordering U WGN:FAOZH, LAURIE J [[id number]] Attending Provider:Haviland, Almyra Free, MD [phone] Authorizing Provider: Ethelene Hal, NP [phone] [place] DEPT[[id number]]  Order Specific Information Order: COPD clinic follow up [Custom: [id number]]  Order #: [id number] Qty: 1   Priority: Routine  Class: Hospital Performed     Where does the patient receive follow up COPD care -> [place]       Follow up in -> 5-7 days     Released on: 08/26/2017  1:24 PM     Priority: Routine  Class: Hospital Performed     Where does the patient receive follow up COPD care -> [place]       Follow up in -> 5-7 days     Released on: 08/26/2017  1:24 PM

## 2017-08-26 NOTE — BH Assessment (Signed)
[facility] Assessment Progress Note 08/26/17: Per Mariea Clonts MD patient meets inpatient criteria and has been accepted to [facility] 407-1 at 1330 hours.

## 2017-08-26 NOTE — ED Notes (Signed)
Pt discharged safely with [place] driver.  All of patients personal belongings were double bagged due to previous scabies infestation.  A separate white belonging bag was sent so that patient had something to wear besides scrubs.  Pt was calm and cooperative.  Pt was in no distress.

## 2017-08-26 NOTE — Telephone Encounter (Signed)
Received order from hospital, patient needs hospital follow up for COPD.  Patient's phone number is listetd as [phone]. Tried to reach patient at [phone], listed as mother's number and on records request form and this number is out of service.  Closing order as unable to reach patient.

## 2017-08-26 NOTE — BHH Group Notes (Signed)
Pt attended wrap up group 

## 2017-08-26 NOTE — Tx Team (Signed)
Initial Treatment Plan 08/26/2017 3:46 PM Axl Rodino TWK:[id number]    PATIENT STRESSORS: Financial difficulties Health problems Loss of wife and sons Medication change or noncompliance Substance abuse   PATIENT STRENGTHS: Ability for insight Average or above average intelligence Capable of independent living General fund of knowledge Motivation for treatment/growth   PATIENT IDENTIFIED PROBLEMS: Depression  Substance Abuse Suicidal thoughts "Get to thinking a little better"                     DISCHARGE CRITERIA:  Ability to meet basic life and health needs Improved stabilization in mood, thinking, and/or behavior Verbal commitment to aftercare and medication compliance Withdrawal symptoms are absent or subacute and managed without 24-hour nursing intervention  PRELIMINARY DISCHARGE PLAN: Attend aftercare/continuing care group Return to previous living arrangement  PATIENT/FAMILY INVOLVEMENT: This treatment plan has been presented to and reviewed with the patient, Samuel Reyes, and/or family member, .  The patient and family have been given the opportunity to ask questions and make suggestions.  Yechezkel Fertig, [place], [place] 08/26/2017, 3:46 PM

## 2017-08-26 NOTE — Progress Notes (Signed)
Nursing Progress Note [id number]  D) Patient presents with anxious mood but is pleasant and cooperative with writer this evening. Patient continues to complain of irritated, itchy skin. Patient has a rash to his forearms, chest and back. Per chart review, patient has been treated for scabies and cleared no longer needing contact precautions. Patient provided PRN Vistaril for his complaints. Patient provided scheduled medications and requested Trazodone at bedtime. Patient did attend group and is seen interactive in the milieu. Patient endorses passive SI with no plan while at [facility] and denies HI/AVH or pain. Patient contracts for safety on the unit. Patient reports he is adjusting well to the unit with no concerns at this time.  A) Emotional support given. 1:1 interaction and active listening provided. Patient medicated as prescribed. Medications and plan of care reviewed with patient. Patient verbalized understanding without further questions. Snacks and fluids provided. Opportunities for questions or concerns presented to patient. Patient encouraged to continue to work on treatment goals. Labs, vital signs and patient behavior monitored throughout shift. Patient safety maintained with q15 min safety checks. Moderate fall risk precautions in place. Patient with order to program on 300 hall.  R) Patient receptive to interaction with nurse. Patient remains safe on the unit at this time. Patient denies any adverse medication reactions at this time. Patient is resting in bed without complaints. Will continue to monitor.

## 2017-08-26 NOTE — Patient Outreach (Signed)
CPSS met with the patient and provided substance use recovery support. Patient is only interested in substance use support group meetings at this time. CPSS provided an AA meeting list and CPSS contact information. CPSS encouraged the patient to contact CPSS at anytime for substance use recovery support.

## 2017-08-26 NOTE — Progress Notes (Signed)
Patient stated he has had a hard life.  He found one of his sons dead in his bed 6 yrs ago.  Another son died 75 yrs ago.  Buried his wife 4 yrs ago.  Has had 27 surgeries, last surgery was 5 yrs ago.  Patient denied SI and HI, contracts for safety.  Denied A/V hallucinations.  Patient stated he broke his glasses 6 months and needs new glasses, also broke his dentures.

## 2017-08-26 NOTE — Progress Notes (Signed)
Samuel Reyes is a 63 year old male pt admitted on voluntary basis. On admission, he does endorse taking overdose recently and spoke about being homeless, having no supports and reports that he has been drinking in excess. He displays minimal signs or symptoms of withdrawal and reports feeling better in regards to his withdrawal. He does endorse depression and passive SI but able to contract for safety while in the hospital. He reports that he really does not have a PCP and recently has not been taking his medications like he should. [facility] and reports that they are attempting to find him someplace to live and reports he has SSI income that can pay for it. Samuel Reyes was oriented to the unit and safety maintained.

## 2017-08-26 NOTE — Consult Note (Signed)
[facility] Consult   Reason for Consult: Suicide attempt  Referring Physician:  EDP Patient Identification: Vin Yonke MRN:  [id number] Principal Diagnosis: Alcohol abuse with alcohol-induced mood disorder [facility]) Diagnosis:   Patient Active Problem List   Diagnosis Date Noted  . Alcohol withdrawal ([place]) [F10.239] 08/08/2017  . COPD with acute bronchitis ([place]) [J44.0, J20.9] 07/29/2017  . Alcohol use [Z78.9]   . Homelessness [Z59.0] 06/24/2017  . Lung nodule [R91.1] 09/03/2016  . Tobacco abuse [Z72.0] 09/03/2016  . Insomnia [G47.00] 09/03/2016  . HCAP (healthcare-associated pneumonia) [J18.9] 08/22/2016  . Bronchiolitis [J21.9] 08/21/2016  . Malnutrition of moderate degree [E44.0] 08/04/2016  . Atrial tachycardia ([place]) [I47.1] 08/02/2016  . Impaired glucose tolerance [R73.02] 08/02/2016  . Alcohol abuse with alcohol-induced mood disorder ([place]) [F10.14] 12/12/2015  . Syncope [R55] 11/15/2015  . Chest pain [R07.9] 11/15/2015  . Nausea vomiting and diarrhea [R11.2, R19.7] 11/15/2015  . Abdominal pain [R10.9] 11/15/2015  . Major depressive disorder, recurrent episode, moderate with anxious distress ([place]) [F33.1] 10/30/2015  . Alcohol use disorder, severe, dependence ([place]) [F10.20] 10/29/2015  . COPD exacerbation ([place]) [J44.1] 09/20/2015  . Acute respiratory failure with hypoxia ([place]) [J96.01] 09/18/2015  . Suicidal ideation [R45.851] 09/17/2015  . Alcohol intoxication ([place]) [F10.929] 09/17/2015  . Depression [F32.9] 09/17/2015  . Benign essential HTN [I10] 09/17/2015  . Hypokalemia [E87.6] 09/17/2015  . Hyponatremia [E87.1] 09/17/2015  . Coffee ground emesis [K92.0] 09/17/2015  . COPD (chronic obstructive pulmonary disease) ([place]) [J44.9] 02/17/2013  . GERD (gastroesophageal reflux disease) [K21.9] 02/17/2013    Total Time spent with patient: 30 minutes  Subjective:   Samuel Reyes is a 62 y.o. male patient admitted with suicide attempt by overdose.  HPI:   Pt was seen and chart reviewed with treatment team and Dr Mariea Clonts. Pt stated he has a very difficult past; he has buried both sons and his wife in the past 6 years. Pt is homeless and living in a shelter. Pt has a history of alcoholism, BAL 184, UDS negative. Pt stated he is depressed and took an unidentified amount of pills in an attempt to end his life. Pt would benefit from an inpatient psychiatric admission for medication management and crisis stabilization.   Past Psychiatric History: As above   Risk to Self: Suicidal Ideation: Yes-Currently Present Suicidal Intent: Yes-Currently Present Is patient at risk for suicide?: Yes Suicidal Plan?: Yes-Currently Present Specify Current Suicidal Plan: pt reports PTA to the ED he intentionally ingested 10 Trazdone Access to Means: Yes Specify Access to Suicidal Means: pt has access to medication What has been your use of drugs/alcohol within the last 12 months?: reports to daily alcohol use  How many times?: 1 Triggers for Past Attempts: Family contact, Other personal contacts, Spouse contact Intentional Self Injurious Behavior: None Risk to Others: Homicidal Ideation: No Thoughts of Harm to Others: No Current Homicidal Intent: No Current Homicidal Plan: No Access to Homicidal Means: No History of harm to others?: No Assessment of Violence: None Noted Does patient have access to weapons?: Yes (Comment)(pt says he owns guns ) Criminal Charges Pending?: No Does patient have a court date: No Prior Inpatient Therapy: Prior Inpatient Therapy: Yes Prior Therapy Dates: multiple (last time 04/2017) Prior Therapy Facilty/Provider(s): multiple (last time was at [facility]) Reason for Treatment: depression Prior Outpatient Therapy: Prior Outpatient Therapy: Yes Prior Therapy Dates: pt reports "years ago" Prior Therapy Facilty/Provider(s): [facility] Reason for Treatment: MED MANAGEMENT Does patient have an ACCT team?: No Does patient have  Intensive In-House Services?  :  No Does patient have [facility] services? : No Does patient have P4CC services?: No  Past Medical History:  Past Medical History:  Diagnosis Date  . Alcoholism ([place])   . CAD (coronary artery disease) 2002   MI, no intervention required  . COPD (chronic obstructive pulmonary disease) (H[facility])    not on home O2  . Depression   . GERD (gastroesophageal reflux disease)   . Headache   . Hypertension   . Stroke [facility]) 2006    Past Surgical History:  Procedure Laterality Date  . BACK SURGERY     3 cervical spine surgeries C4-C5 fused  . COLONOSCOPY N/A 01/04/2014   Procedure: COLONOSCOPY;  Surgeon: Danie Binder, MD;  Location: [facility];  Service: Endoscopy;  Laterality: N/A;  1:45  . ESOPHAGOGASTRODUODENOSCOPY N/A 01/04/2014   Procedure: ESOPHAGOGASTRODUODENOSCOPY (EGD);  Surgeon: Danie Binder, MD;  Location: [facility] ENDO SUITE;  Service: Endoscopy;  Laterality: N/A;  . FINGER SURGERY Left    2nd, 3rd, & 4th fingers were cut off by table saw and reattached  . GASTRECTOMY    . HERNIA REPAIR    . INCISIONAL HERNIA REPAIR N/A 01/20/2014   Procedure: LAPAROSCOPIC RECURRENT  INCISIONAL HERNIA with mesh;  Surgeon: Edward Jolly, MD;  Location: [facility] ORS;  Service: General;  Laterality: N/A;  . rt knee arthroscopic surgery    . SHOULDER SURGERY Bilateral    3 surgeries on on left, 2 surgeries on right    Family History:  Family History  Problem Relation Age of Onset  . Cancer Father        bone  . Cancer Brother        lungs  . Stroke Maternal Grandmother   . Asthma Son        died at age 59 in his sleep   . Spina bifida Son        died at age 70   . Dementia Mother   . Colon cancer Neg Hx    Family Psychiatric  History: Unknown Social History:  Social History   Substance and Sexual Activity  Alcohol Use Yes  . Alcohol/week: 1.8 - 2.4 oz  . Types: 3 - 4 Cans of beer per week   Comment: everyday- 3-4 40 oz     Social History   Substance and  Sexual Activity  Drug Use No   Comment: denied using any drugs    Social History   Socioeconomic History  . Marital status: Widowed    Spouse name: Not on file  . Number of children: Not on file  . Years of education: Not on file  . Highest education level: Not on file  Social Needs  . Financial resource strain: Not on file  . Food insecurity - worry: Not on file  . Food insecurity - inability: Not on file  . Transportation needs - medical: Not on file  . Transportation needs - non-medical: Not on file  Occupational History  . Occupation: Disability  Tobacco Use  . Smoking status: Current Every Day Smoker    Packs/day: 2.00    Years: 52.00    Pack years: 104.00    Types: Cigarettes    Start date: 07/30/1966  . Smokeless tobacco: Never Used  . Tobacco comment: down to 1/4 ppd  Substance and Sexual Activity  . Alcohol use: Yes    Alcohol/week: 1.8 - 2.4 oz    Types: 3 - 4 Cans of beer per week    Comment:  everyday- 3-4 40 oz  . Drug use: No    Comment: denied using any drugs  . Sexual activity: No  Other Topics Concern  . Not on file  Social History Narrative  . Not on file   Additional Social History: N/A    Allergies:   Allergies  Allergen Reactions  . Bee Venom Anaphylaxis  . Penicillins Rash    Has patient had a PCN reaction causing immediate rash, facial/tongue/throat swelling, SOB or lightheadedness with hypotension: {Yes Has patient had a PCN reaction causing severe rash involving mucus membranes or skin necrosis: YES Has patient had a PCN reaction that required hospitalization Yes Has patient had a PCN reaction occurring within the last 10 years: YES If all of the above answers are "NO", then may proceed with Cephalosporin use.     Labs: No results found for this or any previous visit (from the past 48 hour(s)).  Current Facility-Administered Medications  Medication Dose Route Frequency Provider Last Rate Last Dose  . 0.9 %  sodium chloride infusion    Intravenous Continuous Isla Pence, MD   Stopped at 08/24/17 0003  . acetaminophen (TYLENOL) tablet 650 mg  650 mg Oral Q4H PRN Isla Pence, MD   650 mg at 08/23/17 2036  . albuterol (PROVENTIL HFA;VENTOLIN HFA) 108 (90 Base) MCG/ACT inhaler 1-2 puff  1-2 puff Inhalation Q4H PRN Isla Pence, MD      . alum & mag hydroxide-simeth (MAALOX/MYLANTA) 200-200-20 MG/5ML suspension 30 mL  30 mL Oral Q6H PRN Isla Pence, MD      . buPROPion [facility] SR) 12 hr tablet 200 mg  200 mg Oral QHS Isla Pence, MD   200 mg at 08/25/17 2109  . hydrOXYzine (ATARAX/VISTARIL) tablet 25 mg  25 mg Oral Q6H PRN Daleen Bo, MD   25 mg at 08/26/17 1033  . LORazepam (ATIVAN) injection 0-4 mg  0-4 mg Intravenous Q12H Isla Pence, MD       Or  . LORazepam (ATIVAN) tablet 0-4 mg  0-4 mg Oral Q12H Isla Pence, MD      . multivitamin with minerals tablet 1 tablet  1 tablet Oral Daily Isla Pence, MD   1 tablet at 08/26/17 1024  . nicotine (NICODERM CQ - dosed in mg/24 hours) patch 21 mg  21 mg Transdermal Daily Isla Pence, MD   21 mg at 08/26/17 1030  . ondansetron (ZOFRAN) tablet 4 mg  4 mg Oral Q8H PRN Isla Pence, MD      . thiamine (VITAMIN B-1) tablet 100 mg  100 mg Oral Daily Isla Pence, MD   100 mg at 08/26/17 1024   Or  . thiamine (B-1) injection 100 mg  100 mg Intravenous Daily Isla Pence, MD      . tiotropium [facility]) inhalation capsule 18 mcg  18 mcg Inhalation Daily Isla Pence, MD   18 mcg at 08/26/17 1029   Current Outpatient Medications  Medication Sig Dispense Refill  . acetaminophen (TYLENOL) 500 MG tablet Take 1,000 mg by mouth every 6 (six) hours as needed for mild pain, moderate pain, fever or headache.    . albuterol (PROVENTIL HFA;VENTOLIN HFA) 108 (90 Base) MCG/ACT inhaler Inhale 1-2 puffs into the lungs every 4 (four) hours as needed for wheezing. 1 Inhaler 1  . benzonatate (TESSALON) 100 MG capsule Take 2 capsules (200 mg total) by mouth  every 8 (eight) hours. 21 capsule 0  . buPROPion (WELLBUTRIN SR) 200 MG 12 hr tablet Take 200 mg by mouth at  bedtime.  0  . chlordiazePOXIDE (LIBRIUM) 25 MG capsule 50mg  PO TID x 1D, then 25-50mg  PO BID X 1D, then 25-50mg  PO QD X 1D 10 capsule 0  . meloxicam (MOBIC) 7.5 MG tablet Take 1 tablet (7.5 mg total) by mouth daily. 30 tablet 1  . metoprolol (LOPRESSOR) 50 MG tablet Take 1 tablet (50 mg total) by mouth 2 (two) times daily. 60 tablet 1  . Multiple Vitamin (MULTIVITAMIN WITH MINERALS) TABS tablet Take 1 tablet by mouth daily. 30 tablet 1  . permethrin (ELIMITE) 5 % cream Apply to affected area once 60 g 0  . predniSONE (DELTASONE) 10 MG tablet Take 4 tablets (40 mg total) by mouth daily for 5 days. 20 tablet 0  . SPIRIVA HANDIHALER 18 MCG inhalation capsule Place 1 puff into inhaler and inhale daily.  0  . traZODone (DESYREL) 100 MG tablet Take 1 tablet (100 mg total) by mouth at bedtime.      Musculoskeletal: Strength & Muscle Tone: within normal limits Gait & Station: normal Patient leans: N/A  Psychiatric Specialty Exam: Physical Exam  Nursing note and vitals reviewed. Constitutional: He is oriented to person, place, and time. He appears well-developed and well-nourished.  HENT:  Head: Normocephalic.  Neck: Normal range of motion.  Respiratory: Effort normal.  Musculoskeletal: Normal range of motion.  Neurological: He is alert and oriented to person, place, and time.  Psychiatric: His speech is normal and behavior is normal. Thought content normal. Cognition and memory are normal. He expresses impulsivity. He exhibits a depressed mood.    Review of Systems  Psychiatric/Behavioral: Positive for depression and substance abuse. Negative for hallucinations, memory loss and suicidal ideas. The patient is not nervous/anxious and does not have insomnia.   All other systems reviewed and are negative.   Blood pressure 126/77, pulse 90, temperature 98.3 F (36.8 C), temperature  source Oral, resp. rate 20, SpO2 99 %.There is no height or weight on file to calculate BMI.  General Appearance: Casual  Eye Contact:  Good  Speech:  Clear and Coherent  Volume:  Normal  Mood:  Anxious and Depressed  Affect:  Congruent and Depressed  Thought Process:  Coherent, Goal Directed and Linear  Orientation:  Full (Time, Place, and Person)  Thought Content:  Logical  Suicidal Thoughts:  No  Homicidal Thoughts:  No  Memory:  Immediate;   Good Recent;   Good Remote;   Fair  Judgement:  Fair  Insight:  Fair  Psychomotor Activity:  Normal  Concentration:  Concentration: Good and Attention Span: Good  Recall:  Good  Fund of Knowledge:  Good  Language:  Good  Akathisia:  No  Handed:  Right  AIMS (if indicated):    N/A  Assets:  Communication Skills Desire for Improvement Social Support  ADL's:  Intact  Cognition:  WNL  Sleep:    N/A     Treatment Plan Summary: Daily contact with patient to assess and evaluate symptoms and progress in treatment and Medication management (see MAR )  Disposition: Recommend psychiatric Inpatient admission when medically cleared. Pt accepted to [facility] bed 407-1  Ethelene Hal, NP 08/26/2017 1:10 PM   Patient seen face-to-face for psychiatric evaluation, chart reviewed and case discussed with the physician extender and developed treatment plan. Reviewed the information documented and agree with the treatment plan.  Buford Dresser, DO 08/26/17 10:35 PM

## 2017-08-26 NOTE — Progress Notes (Signed)
  Adult Psychoeducational Group Note  Date:  08/26/2017 Time:  4:42 PM  Group Topic/Focus:  BINGO/Relaxation Activity   Participation Level:  Active  Participation Quality:  Appropriate  Affect:  Appropriate  Cognitive:  Appropriate  Insight: Appropriate  Engagement in Group:  Engaged  Modes of Intervention:  Activity  Additional Comments:    Rifka Ramey L 08/26/2017, 4:42 PM  

## 2017-08-27 DIAGNOSIS — F17210 Nicotine dependence, cigarettes, uncomplicated: Secondary | ICD-10-CM

## 2017-08-27 DIAGNOSIS — F332 Major depressive disorder, recurrent severe without psychotic features: Principal | ICD-10-CM

## 2017-08-27 DIAGNOSIS — T50902A Poisoning by unspecified drugs, medicaments and biological substances, intentional self-harm, initial encounter: Secondary | ICD-10-CM

## 2017-08-27 DIAGNOSIS — Z810 Family history of intellectual disabilities: Secondary | ICD-10-CM

## 2017-08-27 DIAGNOSIS — F1010 Alcohol abuse, uncomplicated: Secondary | ICD-10-CM

## 2017-08-27 DIAGNOSIS — Z590 Homelessness: Secondary | ICD-10-CM

## 2017-08-27 DIAGNOSIS — Z736 Limitation of activities due to disability: Secondary | ICD-10-CM

## 2017-08-27 DIAGNOSIS — Z634 Disappearance and death of family member: Secondary | ICD-10-CM

## 2017-08-27 DIAGNOSIS — T1491XA Suicide attempt, initial encounter: Secondary | ICD-10-CM

## 2017-08-27 MED ORDER — BUPROPION HCL ER (XL) 300 MG PO TB24
300.0000 mg | ORAL_TABLET | Freq: Every day | ORAL | Status: DC
  Administered 2017-08-28 – 2017-08-29 (×2): 300 mg via ORAL
  Filled 2017-08-27 (×2): qty 1
  Filled 2017-08-27: qty 14
  Filled 2017-08-27 (×2): qty 1

## 2017-08-27 NOTE — H&P (Addendum)
Psychiatric Admission Assessment Adult  Patient Identification: Samuel Reyes MRN:  [id number] Date of Evaluation:  08/27/2017 Chief Complaint:  Suicidal behavior Principal Diagnosis: Substance Induced Mood Disorder Diagnosis:   Patient Active Problem List   Diagnosis Date Noted  . MDD (major depressive disorder), recurrent severe, without psychosis ([place]) [F33.2] 08/26/2017  . Alcohol withdrawal ([place]) [F10.239] 08/08/2017  . COPD with acute bronchitis ([place]) [J44.0, J20.9] 07/29/2017  . Alcohol use [Z78.9]   . Homelessness [Z59.0] 06/24/2017  . Lung nodule [R91.1] 09/03/2016  . Tobacco abuse [Z72.0] 09/03/2016  . Insomnia [G47.00] 09/03/2016  . HCAP (healthcare-associated pneumonia) [J18.9] 08/22/2016  . Bronchiolitis [J21.9] 08/21/2016  . Malnutrition of moderate degree [E44.0] 08/04/2016  . Atrial tachycardia ([place]) [I47.1] 08/02/2016  . Impaired glucose tolerance [R73.02] 08/02/2016  . Alcohol abuse with alcohol-induced mood disorder ([place]) [F10.14] 12/12/2015  . Syncope [R55] 11/15/2015  . Chest pain [R07.9] 11/15/2015  . Nausea vomiting and diarrhea [R11.2, R19.7] 11/15/2015  . Abdominal pain [R10.9] 11/15/2015  . Major depressive disorder, recurrent episode, moderate with anxious distress ([place]) [F33.1] 10/30/2015  . Alcohol use disorder, severe, dependence ([place]) [F10.20] 10/29/2015  . COPD exacerbation ([place]) [J44.1] 09/20/2015  . Acute respiratory failure with hypoxia ([place]) [J96.01] 09/18/2015  . Suicidal ideation [R45.851] 09/17/2015  . Alcohol intoxication ([place]) [F10.929] 09/17/2015  . Depression [F32.9] 09/17/2015  . Benign essential HTN [I10] 09/17/2015  . Hypokalemia [E87.6] 09/17/2015  . Hyponatremia [E87.1] 09/17/2015  . Coffee ground emesis [K92.0] 09/17/2015  . COPD (chronic obstructive pulmonary disease) ([place]) [J44.9] 02/17/2013  . GERD (gastroesophageal reflux disease) [K21.9] 02/17/2013   History of Present Illness:   62 y.o Caucasian male, widower,  homeless, on SSID. Background history of AUD. Presented to the ER repeatedly in the past couple of weeks. Current presentation was via EMS. He was intoxicated with alcohol. BAL184 mg/dl  He overdose on twenty seven pills. He was ruminating on is losses at that time. Patient was managed at the ER for some days and referred here for stabilization.   At interview, patient reports that he drinks on a daily basis. Says that is the way he has been coping with his losses. He lost a son 51 years ago. His other son passed six years ago and then his wife four years ago. Says when drunk, he begins to feel there is no point going on. Patient says he has no recollection of taking the OD.  No goodbye messages before he took the OD. No measures to avoid being detected. Friends who were there called EMS. He does not know what he took. He did not research  the effects of the medications on the body.  Patient is remorseful. No current suicidal thoughts. No residual cognitive dulling. Patient says he wants to stay sober. Says his spirits are better now he is sober. He is not feeling as tired as he used to feel. His thoughts are now clearer. No associated psychosis. No evidence of mania. No overwhelming anxiety. No evidence of PTSD. No substance use. No thoughts of harming others. No thoughts of violence. No access to weapons. No other stressors at this time.    Total Time spent with patient: 1 hour  Past Psychiatric History: SUD. Patient has been in rehab on multiple occassions in the past. He has had accidental overdoses in the past. He denies any active suicidal attempt. He denies any past history of violent behavior. He is currently on Bupropion.    Is the patient at risk to self? No.  Has  the patient been a risk to self in the past 6 months? No.  Has the patient been a risk to self within the distant past? No.  Is the patient a risk to others? No.  Has the patient been a risk to others in the past 6 months? No.   Has the patient been a risk to others within the distant past? No.   Prior Inpatient Therapy:   Prior Outpatient Therapy:    Alcohol Screening: 1. How often do you have a drink containing alcohol?: 2 to 3 times a week 2. How many drinks containing alcohol do you have on a typical day when you are drinking?: 10 or more 3. How often do you have six or more drinks on one occasion?: Daily or almost daily AUDIT-C Score: 11 4. How often during the last year have you found that you were not able to stop drinking once you had started?: Daily or almost daily 5. How often during the last year have you failed to do what was normally expected from you becasue of drinking?: Daily or almost daily 6. How often during the last year have you needed a first drink in the morning to get yourself going after a heavy drinking session?: Daily or almost daily 7. How often during the last year have you had a feeling of guilt of remorse after drinking?: Weekly 8. How often during the last year have you been unable to remember what happened the night before because you had been drinking?: Monthly 9. Have you or someone else been injured as a result of your drinking?: No 10. Has a relative or friend or a doctor or another health worker been concerned about your drinking or suggested you cut down?: Yes, during the last year Alcohol Use Disorder Identification Test Final Score (AUDIT): 32 Intervention/Follow-up: Alcohol Education Substance Abuse History in the last 12 months:  Yes.   Consequences of Substance Abuse: Blackouts:  and confusion.  Previous Psychotropic Medications: Yes  Psychological Evaluations: Yes  Past Medical History:  Past Medical History:  Diagnosis Date  . Alcoholism ([place])   . CAD (coronary artery disease) 2002   MI, no intervention required  . COPD (chronic obstructive pulmonary disease) (H[facility])    not on home O2  . Depression   . GERD (gastroesophageal reflux disease)   . Headache   .  Hypertension   . Stroke [facility]) 2006    Past Surgical History:  Procedure Laterality Date  . BACK SURGERY     3 cervical spine surgeries C4-C5 fused  . COLONOSCOPY N/A 01/04/2014   Procedure: COLONOSCOPY;  Surgeon: Danie Binder, MD;  Location: [facility] ENDO [facility];  Service: Endoscopy;  Laterality: N/A;  1:45  . ESOPHAGOGASTRODUODENOSCOPY N/A 01/04/2014   Procedure: ESOPHAGOGASTRODUODENOSCOPY (EGD);  Surgeon: Danie Binder, MD;  Location: [facility] ENDO SU[facility];  Service: Endoscopy;  Laterality: N/A;  . FINGER SURGERY Left    2nd, 3rd, & 4th fingers were cut off by table saw and reattached  . GASTRECTOMY    . HERNIA REPAIR    . INCISIONAL HERNIA REPAIR N/A 01/20/2014   Procedure: LAPAROSCOPIC RECURRENT  INCISIONAL HERNIA with mesh;  Surgeon: Edward Jolly, MD;  Location: [facility] ORS;  Service: General;  Laterality: N/A;  . rt knee arthroscopic surgery    . SHOULDER SURGERY Bilateral    3 surgeries on on left, 2 surgeries on right    Family History:  Family History  Problem Relation Age of Onset  . Cancer Father  bone  . Cancer Brother        lungs  . Stroke Maternal Grandmother   . Asthma Son        died at age 95 in his sleep   . Spina bifida Son        died at age 73   . Dementia Mother   . Colon cancer Neg Hx    Family Psychiatric  History: Denies any family history of mental illness, substance use disorder or suicide.  Tobacco Screening: Have you used any form of tobacco in the last 30 days? (Cigarettes, Smokeless Tobacco, Cigars, and/or Pipes): Yes Tobacco use, Select all that apply: 5 or more cigarettes per day Are you interested in Tobacco Cessation Medications?: Yes, will notify MD for an order Counseled patient on smoking cessation including recognizing danger situations, developing coping skills and basic information about quitting provided: Refused/Declined practical counseling Social History:  Social History   Substance and Sexual Activity  Alcohol Use Yes  . Alcohol/week:  1.8 - 2.4 oz  . Types: 3 - 4 Cans of beer per week   Comment: everyday- 3-4 40 oz     Social History   Substance and Sexual Activity  Drug Use No   Comment: denied using any drugs    Additional Social History:                           Allergies:   Allergies  Allergen Reactions  . Bee Venom Anaphylaxis  . Penicillins Rash    Has patient had a PCN reaction causing immediate rash, facial/tongue/throat swelling, SOB or lightheadedness with hypotension: {Yes Has patient had a PCN reaction causing severe rash involving mucus membranes or skin necrosis: YES Has patient had a PCN reaction that required hospitalization Yes Has patient had a PCN reaction occurring within the last 10 years: YES If all of the above answers are "NO", then may proceed with Cephalosporin use.    Lab Results: No results found for this or any previous visit (from the past 48 hour(s)).  Blood Alcohol level:  Lab Results  Component Value Date   ETH 184 (H) 08/23/2017   ETH 48 (H) 02/54/2706    Metabolic Disorder Labs:  Lab Results  Component Value Date   HGBA1C 5.4 06/26/2017   MPG 108 06/26/2017   MPG 105 08/03/2016   No results found for: PROLACTIN Lab Results  Component Value Date   CHOL 247 (H) 02/17/2013   TRIG 144 02/17/2013   HDL 79 02/17/2013   CHOLHDL 3.1 02/17/2013   VLDL 29 02/17/2013   LDLCALC 139 (H) 02/17/2013    Current Medications: Current Facility-Administered Medications  Medication Dose Route Frequency Provider Last Rate Last Dose  . acetaminophen (TYLENOL) tablet 650 mg  650 mg Oral Q6H PRN Ethelene Hal, NP   650 mg at 08/27/17 0742  . albuterol (PROVENTIL HFA;VENTOLIN HFA) 108 (90 Base) MCG/ACT inhaler 1-2 puff  1-2 puff Inhalation Q4H PRN Ethelene Hal, NP      . alum & mag hydroxide-simeth (MAALOX/MYLANTA) 200-200-20 MG/5ML suspension 30 mL  30 mL Oral Q4H PRN Ethelene Hal, NP      . buPROPion [facility] SR) 12 hr tablet 200 mg  200  mg Oral QHS Ethelene Hal, NP   200 mg at 08/26/17 2104  . feeding supplement (ENSURE ENLIVE) (ENSURE ENLIVE) liquid 237 mL  237 mL Oral BID BM Cobos, Myer Peer, MD      .  hydrOXYzine (ATARAX/VISTARIL) tablet 25 mg  25 mg Oral Q6H PRN Ethelene Hal, NP   25 mg at 08/27/17 0742  . magnesium hydroxide (MILK OF MAGNESIA) suspension 30 mL  30 mL Oral Daily PRN Ethelene Hal, NP      . nicotine (NICODERM CQ - dosed in mg/24 hours) patch 21 mg  21 mg Transdermal Daily Ethelene Hal, NP   21 mg at 08/27/17 0738  . tiotropium (SPIRIVA) inhalation capsule 18 mcg  18 mcg Inhalation Daily Ethelene Hal, NP   18 mcg at 08/27/17 0736  . traZODone (DESYREL) tablet 50 mg  50 mg Oral QHS PRN Ethelene Hal, NP   50 mg at 08/26/17 2104   PTA Medications: Medications Prior to Admission  Medication Sig Dispense Refill Last Dose  . acetaminophen (TYLENOL) 500 MG tablet Take 1,000 mg by mouth every 6 (six) hours as needed for mild pain, moderate pain, fever or headache.   08/23/2017 at Unknown time  . albuterol (PROVENTIL HFA;VENTOLIN HFA) 108 (90 Base) MCG/ACT inhaler Inhale 1-2 puffs into the lungs every 4 (four) hours as needed for wheezing. 1 Inhaler 1 08/23/2017 at Unknown time  . benzonatate (TESSALON) 100 MG capsule Take 2 capsules (200 mg total) by mouth every 8 (eight) hours. 21 capsule 0 08/23/2017 at Unknown time  . buPROPion (WELLBUTRIN SR) 200 MG 12 hr tablet Take 200 mg by mouth at bedtime.  0 08/22/2017 at Unknown time  . chlordiazePOXIDE (LIBRIUM) 25 MG capsule 50mg  PO TID x 1D, then 25-50mg  PO BID X 1D, then 25-50mg  PO QD X 1D 10 capsule 0 08/23/2017 at Unknown time  . meloxicam (MOBIC) 7.5 MG tablet Take 1 tablet (7.5 mg total) by mouth daily. 30 tablet 1 08/23/2017 at Unknown time  . metoprolol (LOPRESSOR) 50 MG tablet Take 1 tablet (50 mg total) by mouth 2 (two) times daily. 60 tablet 1 08/23/2017 at 1100  . Multiple Vitamin (MULTIVITAMIN WITH MINERALS) TABS  tablet Take 1 tablet by mouth daily. 30 tablet 1 08/23/2017 at Unknown time  . permethrin (ELIMITE) 5 % cream Apply to affected area once 60 g 0 08/23/2017 at Unknown time  . predniSONE (DELTASONE) 10 MG tablet Take 4 tablets (40 mg total) by mouth daily for 5 days. 20 tablet 0 08/23/2017 at Unknown time  . SPIRIVA HANDIHALER 18 MCG inhalation capsule Place 1 puff into inhaler and inhale daily.  0 08/22/2017 at Unknown time  . traZODone (DESYREL) 100 MG tablet Take 1 tablet (100 mg total) by mouth at bedtime.   Past Week at Unknown time    Musculoskeletal: Strength & Muscle Tone: within normal limits Gait & Station: normal Patient leans: N/A  Psychiatric Specialty Exam: Physical Exam  Constitutional: He appears well-developed and well-nourished.  HENT:  Head: Normocephalic and atraumatic.  Respiratory: Effort normal.  Neurological: He is alert.  Psychiatric:  As above.     ROS  Blood pressure 115/79, pulse (!) 105, temperature 97.6 F (36.4 C), temperature source Oral, resp. rate 18, height 5\' 3"  (1.6 m), weight 63.5 kg (140 lb).Body mass index is 24.8 kg/m.  General Appearance: in hospital clothing. Not shaky, not sweaty, not confused. Not unsteady, normal conjugate eye movements. Not internally distressed. Appropriate behavior.   Eye Contact:  Good  Speech:  Clear and Coherent and Normal Rate  Volume:  Normal  Mood:  Feels better  Affect:  Appropriate and Restricted  Thought Process:  Linear  Orientation:  Full (Time, Place, and Person)  Thought Content:  Less ruminations. No delusional theme. No preoccupation with violent thoughts. No hallucination iNn any modality.   Suicidal Thoughts:  No  Homicidal Thoughts:  No  Memory:  Immediate;   Good Recent;   Fair Remote;   Fair  Judgement:  Fair  Insight:  Fair  Psychomotor Activity:  Normal  Concentration:  Concentration: Good and Attention Span: Good  Recall:  [employer] of Knowledge:  Fair  Language:  Good  Akathisia:   Negative  Handed:    AIMS (if indicated):     Assets:  Desire for Improvement Financial Resources/Insurance Resilience  ADL's:  Intact  Cognition:  WNL  Sleep:       Treatment Plan Summary: Patient has a long history of AUD. Repeated hospital stay and recent OD was in context of effects of alcohol. Patient is detoxing well. We discussed use of naltrexone to address cravings. Patient consented to treatment after we explored the risks and benefits. We plan to continue current antidepressant as below.   Psychiatric: AUD Substance Induced Mood Disorder  Medical: HTN GERD COPD  Psychosocial:  Financial constraints  Unemployed Limited support Bereavement     PLAN: 1. Alcohol withdrawal protocol 2. Bupropion XL  300 mg daily from tomorrow. 3. Naltrexone 50 mg daily 4. Continue home medical medications at home dose 5. Encourage unit groups and therapeutic activities 6. Monitor mood, behavior and interaction with peers 7. Motivational enhancement  8. SW would gather collateral from his family and coordinate aftercare   Observation Level/Precautions:  Detox 15 minute checks  Laboratory:    Psychotherapy:    Medications:    Consultations:    Discharge Concerns:    Estimated LOS:  Other:     Physician Treatment Plan for Primary Diagnosis: <principal problem not specified> Long Term Goal(s): Improvement in symptoms so as ready for discharge  Short Term Goals: Ability to identify changes in lifestyle to reduce recurrence of condition will improve, Ability to verbalize feelings will improve, Ability to disclose and discuss suicidal ideas, Ability to demonstrate self-control will improve, Ability to identify and develop effective coping behaviors will improve, Ability to maintain clinical measurements within normal limits will improve and Compliance with prescribed medications will improve  Physician Treatment Plan for Secondary Diagnosis: Active Problems:   MDD (major  depressive disorder), recurrent severe, without psychosis ([place])  Long Term Goal(s): Improvement in symptoms so as ready for discharge  Short Term Goals: Ability to identify changes in lifestyle to reduce recurrence of condition will improve, Ability to verbalize feelings will improve, Ability to disclose and discuss suicidal ideas, Ability to demonstrate self-control will improve, Ability to identify and develop effective coping behaviors will improve, Ability to maintain clinical measurements within normal limits will improve, Compliance with prescribed medications will improve and Ability to identify triggers associated with substance abuse/mental health issues will improve  I certify that inpatient services furnished can reasonably be expected to improve the patient's condition.    Artist Beach, MD 1/29/20194:32 PM

## 2017-08-27 NOTE — [facility] Group Notes (Signed)
[facility] Group Notes:  (Nursing/MHT/Case Management/Adjunct)  Date:  08/27/2017  Time:  5:32 PM  Type of Therapy:  Psychoeducational Skills  Participation Level:  Did Not Attend  Participation Quality:    Affect:    Cognitive:    Insight:    Engagement in Group:    Modes of Intervention:    Summary of Progress/Problems:  Cammy Copa 08/27/2017, 5:32 PM

## 2017-08-27 NOTE — [facility] Suicide Risk Assessment (Signed)
[facility] Admission Suicide Risk Assessment   Nursing information obtained from:    Demographic factors:    Current Mental Status:    Loss Factors:    Historical Factors:    Risk Reduction Factors:     Total Time spent with patient: 45 minutes Principal Problem: MDD (major depressive disorder), recurrent severe, without psychosis ([place]) Diagnosis:   Patient Active Problem List   Diagnosis Date Noted  . MDD (major depressive disorder), recurrent severe, without psychosis ([place]) [F33.2] 08/26/2017  . Alcohol withdrawal ([place]) [F10.239] 08/08/2017  . COPD with acute bronchitis ([place]) [J44.0, J20.9] 07/29/2017  . Alcohol use [Z78.9]   . Homelessness [Z59.0] 06/24/2017  . Lung nodule [R91.1] 09/03/2016  . Tobacco abuse [Z72.0] 09/03/2016  . Insomnia [G47.00] 09/03/2016  . HCAP (healthcare-associated pneumonia) [J18.9] 08/22/2016  . Bronchiolitis [J21.9] 08/21/2016  . Malnutrition of moderate degree [E44.0] 08/04/2016  . Atrial tachycardia ([place]) [I47.1] 08/02/2016  . Impaired glucose tolerance [R73.02] 08/02/2016  . Alcohol abuse with alcohol-induced mood disorder ([place]) [F10.14] 12/12/2015  . Syncope [R55] 11/15/2015  . Chest pain [R07.9] 11/15/2015  . Nausea vomiting and diarrhea [R11.2, R19.7] 11/15/2015  . Abdominal pain [R10.9] 11/15/2015  . Major depressive disorder, recurrent episode, moderate with anxious distress ([place]) [F33.1] 10/30/2015  . Alcohol use disorder, severe, dependence ([place]) [F10.20] 10/29/2015  . COPD exacerbation ([place]) [J44.1] 09/20/2015  . Acute respiratory failure with hypoxia ([place]) [J96.01] 09/18/2015  . Suicidal ideation [R45.851] 09/17/2015  . Alcohol intoxication ([place]) [F10.929] 09/17/2015  . Depression [F32.9] 09/17/2015  . Benign essential HTN [I10] 09/17/2015  . Hypokalemia [E87.6] 09/17/2015  . Hyponatremia [E87.1] 09/17/2015  . Coffee ground emesis [K92.0] 09/17/2015  . COPD (chronic obstructive pulmonary disease) ([place]) [J44.9] 02/17/2013  . GERD  (gastroesophageal reflux disease) [K21.9] 02/17/2013   Subjective Data:  62 y.o Caucasian male, widower, homeless, on SSID. Background history of AUD. Presented to the ER repeatedly in the past couple of weeks. Current presentation was via EMS. He was intoxicated with alcohol. BAL184 mg/dl  He overdose on twenty seven pills. He was ruminating on is losses at that time. Patient was managed at the ER for some days and referred here for stabilization. Patient has severe alcohol use disorder. He has had accidental suicidal behavior over the years. No family history of suicide, no evidence of psychosis. No evidence of mania. No cognitive impairment. No access to weapons. He is cooperative with care. He has agreed to treatment recommendations. He has agreed to communicate suicidal thoughts to staff if the thoughts becomes overwhelming.      Continued Clinical Symptoms:  Alcohol Use Disorder Identification Test Final Score (AUDIT): 32 The "Alcohol Use Disorders Identification Test", Guidelines for Use in Primary Care, Second Edition.  World Pharmacologist [facility]). Score between 0-7:  no or low risk or alcohol related problems. Score between 8-15:  moderate risk of alcohol related problems. Score between 16-19:  high risk of alcohol related problems. Score 20 or above:  warrants further diagnostic evaluation for alcohol dependence and treatment.   CLINICAL FACTORS:   Alcohol/Substance Abuse/Dependencies   Musculoskeletal: Strength & Muscle Tone: within normal limits Gait & Station: normal Patient leans: N/A  Psychiatric Specialty Exam: Physical Exam  ROS  Blood pressure 115/79, pulse (!) 105, temperature 97.6 F (36.4 C), temperature source Oral, resp. rate 18, height 5\' 3"  (1.6 m), weight 63.5 kg (140 lb).Body mass index is 24.8 kg/m.  General Appearance: As in H&P  Eye Contact:    Speech:    Volume:  Mood:    Affect:    Thought Process:    Orientation:    Thought Content:     Suicidal Thoughts:  As in H&P  Homicidal Thoughts:    Memory:    Judgement:    Insight:    Psychomotor Activity:    Concentration:    Recall:    Fund of Knowledge:    Language:    Akathisia:    Handed:    AIMS (if indicated):     Assets:    ADL's:    Cognition:  As in H&P  Sleep:         COGNITIVE FEATURES THAT CONTRIBUTE TO RISK:  None    SUICIDE RISK:   Minimal: No identifiable suicidal ideation.  Patients presenting with no risk factors but with morbid ruminations; may be classified as minimal risk based on the severity of the depressive symptoms  PLAN OF CARE:  As in H&P  I certify that inpatient services furnished can reasonably be expected to improve the patient's condition.   Artist Beach, MD 08/27/2017, 4:55 PM
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
## 2017-08-27 NOTE — Progress Notes (Signed)
D:  Patient's self inventory sheet, patient has fair sleep, no sleep medication given.  Good appetite, low energy level, poor concentration.  Rated depression and hopeless 7, anxiety 6.  Withdrawals, diarrhea, cramping, runny nose.  Denied SI.  Physical problems, pain, headaches, rash, blurred vision.  Physical pain, worst pain in past 24 hours #7, no pain medicine.  Goal is head.  Plans to talk.  No discharge plans. A:  Medications administered per MD orders.  Emotional support and encouragement given patient. R:  Denied SI and HI, contracts for safety.  Denied A/V hallucinations.  Safety maintained with 15 minute checks.

## 2017-08-27 NOTE — BHH Group Notes (Signed)
LCSW Group Therapy Note 08/27/2017 4:03 PM  Type of Therapy/Topic: Group Therapy: Feelings about Diagnosis  Participation Level: Active   Description of Group:  This group will allow patients to explore their thoughts and feelings about diagnoses they have received. Patients will be guided to explore their level of understanding and acceptance of these diagnoses. Facilitator will encourage patients to process their thoughts and feelings about the reactions of others to their diagnosis and will guide patients in identifying ways to discuss their diagnosis with significant others in their lives. This group will be process-oriented, with patients participating in exploration of their own experiences, giving and receiving support, and processing challenge from other group members.  Therapeutic Goals: 1. Patient will demonstrate understanding of diagnosis as evidenced by identifying two or more symptoms of the disorder 2. Patient will be able to express two feelings regarding the diagnosis 3. Patient will demonstrate their ability to communicate their needs through discussion and/or role play  Summary of Patient Progress:   Patient was engaged throughout the group session. He participated and contributed to the group's discussion regarding feelings about diagnosis.      Therapeutic Modalities:  Cognitive Behavioral Therapy Brief Therapy Feelings Identification    [place] Clinical Social Worker

## 2017-08-27 NOTE — Plan of Care (Signed)
Nurse discussed depression, anxiety, coping skills with patient.  

## 2017-08-27 NOTE — BHH Suicide Risk Assessment (Signed)
[place] INPATIENT:  Family/Significant Other Suicide Prevention Education  Suicide Prevention Education:  Patient Refusal for Family/Significant Other Suicide Prevention Education: The patient Samuel Reyes has refused to provide written consent for family/significant other to be provided Family/Significant Other Suicide Prevention Education during admission and/or prior to discharge.  Physician notified.  Patient reports that he does not have any current supports for CSW to contact for SPE. He reports that he does not speak to his mother and a sister currently.    Marylee Floras 08/27/2017, 9:56 AM

## 2017-08-27 NOTE — Progress Notes (Signed)
NUTRITION ASSESSMENT  Pt identified as at risk on the Malnutrition Screen Tool  INTERVENTION: 1. Educated patient on the importance of nutrition and encouraged intake of food and beverages. 2. Discussed weight goals. 3. Supplements: continue Ensure Enlive BID, each supplement provides 350 kcal and 20 grams of protein   NUTRITION DIAGNOSIS: Unintentional weight loss related to sub-optimal intake as evidenced by pt report.   Goal: Pt to meet >/= 90% of their estimated nutrition needs.  Monitor:  PO intake  Assessment:  Pt admitted following OD, alcohol abuse with alcohol-induced mood disorder. Pt reports being homeless and he also drinks alcohol daily. Per review, pt has lost 11 lbs (7.3% body weight) in the past 1 month; this is significant for time frame.   Ensure Enlive ordered BID per ONS protocol. Continue to encourage PO intakes of meals, supplements, and snacks.     62 y.o. male  Height: Ht Readings from Last 1 Encounters:  08/26/17 5\' 3"  (1.6 m)    Weight: Wt Readings from Last 1 Encounters:  08/26/17 140 lb (63.5 kg)    Weight Hx: Wt Readings from Last 10 Encounters:  08/26/17 140 lb (63.5 kg)  08/22/17 150 lb (68 kg)  08/07/17 151 lb (68.5 kg)  07/30/17 151 lb 3.8 oz (68.6 kg)  07/16/17 149 lb 9.6 oz (67.9 kg)  07/11/17 150 lb (68 kg)  06/25/17 138 lb 14.2 oz (63 kg)  09/27/16 139 lb 6.4 oz (63.2 kg)  09/03/16 138 lb (62.6 kg)  08/27/16 133 lb (60.3 kg)    BMI:  Body mass index is 24.8 kg/m. Pt meets criteria for normal weight/borderline overweight based on current BMI.  Estimated Nutritional Needs: Kcal: 25-30 kcal/kg Protein: > 1 gram protein/kg Fluid: 1 ml/kcal  Diet Order: Diet regular Room service appropriate? No; Fluid consistency: Thin Pt is also offered choice of unit snacks mid-morning and mid-afternoon.  Pt is eating as desired.   Lab results and medications reviewed.      Jarome Matin, MS, RD, LDN, [facility] Inpatient Clinical  Dietitian Pager # [phone] After hours/weekend pager # [phone]

## 2017-08-27 NOTE — Progress Notes (Signed)
Pt did not attend orientation group.  

## 2017-08-27 NOTE — Progress Notes (Signed)
Nursing Progress Note [id number]  D) Patient presents with mild anxiety and is pleasant/cooperative. Patient programming on 300 hall per provider order. Patient did attend group and is seen interactive in the milieu. Patient denies SI/HI/AVH but complains of generalized body aches. Patient contracts for safety on the unit. Patient medicated as scheduled/requested per provider's orders. Patient denies concerns for writer this evening and is agreeable to current plan of care.   A) Patient medicated as scheduled/requested per provider's orders. Patient safety maintained with q15 min safety checks. High fall risk precautions in place. Emotional support given. 1:1 interaction and active listening provided. Snacks and fluids provided. Labs, vital signs and patient behavior monitored throughout shift.   R) Patient remains safe on the unit at this time. Patient agrees to make needs known to staff. Will continue to monitor and assess for changes.

## 2017-08-27 NOTE — BHH Group Notes (Signed)
[place] Group Notes:  (Nursing/MHT/Case Management/Adjunct)  Date:  08/27/2017  Time:  4:37 PM  Type of Therapy:  Psychoeducational Skills  Participation Level:  Active  Participation Quality:  Appropriate  Affect:  Appropriate  Cognitive:  Appropriate  Insight:  Appropriate  Engagement in Group:  Engaged  Modes of Intervention:  Activity and Education  Summary of Progress/Problems:  Nurse taught participants how to use deep breathing and short guided imagery sessions, on their own, specifically how these pertain to small time spans and stressful situations, in their every day lives.  Cheri Kearns 08/27/2017, 4:37 PM

## 2017-08-28 DIAGNOSIS — F39 Unspecified mood [affective] disorder: Secondary | ICD-10-CM

## 2017-08-28 DIAGNOSIS — G4700 Insomnia, unspecified: Secondary | ICD-10-CM

## 2017-08-28 DIAGNOSIS — F1099 Alcohol use, unspecified with unspecified alcohol-induced disorder: Secondary | ICD-10-CM

## 2017-08-28 DIAGNOSIS — G629 Polyneuropathy, unspecified: Secondary | ICD-10-CM

## 2017-08-28 MED ORDER — GABAPENTIN 100 MG PO CAPS
100.0000 mg | ORAL_CAPSULE | Freq: Three times a day (TID) | ORAL | Status: DC
  Administered 2017-08-28 – 2017-08-29 (×4): 100 mg via ORAL
  Filled 2017-08-28: qty 42
  Filled 2017-08-28 (×3): qty 1
  Filled 2017-08-28: qty 42
  Filled 2017-08-28: qty 1
  Filled 2017-08-28: qty 42
  Filled 2017-08-28: qty 1

## 2017-08-28 NOTE — Progress Notes (Signed)
[facility] MD Progress Note  08/28/2017 2:57 PM Samuel Reyes  MRN:  [id number]   Subjective:  Patient reports that he has a lot of pain today. He states that he hasn't had pain medications in over a month. He reports using multiple medications, but is wanting something to help. He agrees to start Gabapentin. He also complains about itching due to scabies that was treated at [facility] on 08-22-17. He denies any SI/HI/AVH and contracts for safety. He rates his depression at 5/10 and no anxiety.   Objective: Patient's chart and findings reviewed and discussed with treatment team. Patient presents in the day room interacting and has been seen attending groups. Patient has been cooperative as well. Will start gabapentin 100 mg TID for pain. Also encouraged patient to use Vistaril for itching.  Principal Problem: MDD (major depressive disorder), recurrent severe, without psychosis ([place]) Diagnosis:   Patient Active Problem List   Diagnosis Date Noted  . MDD (major depressive disorder), recurrent severe, without psychosis ([place]) [F33.2] 08/26/2017  . Alcohol withdrawal ([place]) [F10.239] 08/08/2017  . COPD with acute bronchitis ([place]) [J44.0, J20.9] 07/29/2017  . Alcohol use [Z78.9]   . Homelessness [Z59.0] 06/24/2017  . Lung nodule [R91.1] 09/03/2016  . Tobacco abuse [Z72.0] 09/03/2016  . Insomnia [G47.00] 09/03/2016  . HCAP (healthcare-associated pneumonia) [J18.9] 08/22/2016  . Bronchiolitis [J21.9] 08/21/2016  . Malnutrition of moderate degree [E44.0] 08/04/2016  . Atrial tachycardia ([place]) [I47.1] 08/02/2016  . Impaired glucose tolerance [R73.02] 08/02/2016  . Alcohol abuse with alcohol-induced mood disorder ([place]) [F10.14] 12/12/2015  . Syncope [R55] 11/15/2015  . Chest pain [R07.9] 11/15/2015  . Nausea vomiting and diarrhea [R11.2, R19.7] 11/15/2015  . Abdominal pain [R10.9] 11/15/2015  . Major depressive disorder, recurrent episode, moderate with anxious distress ([place]) [F33.1] 10/30/2015  . Alcohol use  disorder, severe, dependence ([place]) [F10.20] 10/29/2015  . COPD exacerbation ([place]) [J44.1] 09/20/2015  . Acute respiratory failure with hypoxia ([place]) [J96.01] 09/18/2015  . Suicidal ideation [R45.851] 09/17/2015  . Alcohol intoxication ([place]) [F10.929] 09/17/2015  . Depression [F32.9] 09/17/2015  . Benign essential HTN [I10] 09/17/2015  . Hypokalemia [E87.6] 09/17/2015  . Hyponatremia [E87.1] 09/17/2015  . Coffee ground emesis [K92.0] 09/17/2015  . COPD (chronic obstructive pulmonary disease) ([place]) [J44.9] 02/17/2013  . GERD (gastroesophageal reflux disease) [K21.9] 02/17/2013   Total Time spent with patient: 25 minutes  Past Psychiatric History: See H&P  Past Medical History:  Past Medical History:  Diagnosis Date  . Alcoholism ([place])   . CAD (coronary artery disease) 2002   MI, no intervention required  . COPD (chronic obstructive pulmonary disease) (H[facility])    not on home O2  . Depression   . GERD (gastroesophageal reflux disease)   . Headache   . Hypertension   . Stroke [facility]) 2006    Past Surgical History:  Procedure Laterality Date  . BACK SURGERY     3 cervical spine surgeries C4-C5 fused  . COLONOSCOPY N/A 01/04/2014   Procedure: COLONOSCOPY;  Surgeon: Danie Binder, MD;  Location: [facility] ENDO SUITE;  Service: Endoscopy;  Laterality: N/A;  1:45  . ESOPHAGOGASTRODUODENOSCOPY N/A 01/04/2014   Procedure: ESOPHAGOGASTRODUODENOSCOPY (EGD);  Surgeon: Danie Binder, MD;  Location: [facility] ENDO SUITE;  Service: Endoscopy;  Laterality: N/A;  . FINGER SURGERY Left    2nd, 3rd, & 4th fingers were cut off by table saw and reattached  . GASTRECTOMY    . HERNIA REPAIR    . INCISIONAL HERNIA REPAIR N/A 01/20/2014   Procedure: LAPAROSCOPIC RECURRENT  INCISIONAL HERNIA  with mesh;  Surgeon: Edward Jolly, MD;  Location: [facility] ORS;  Service: General;  Laterality: N/A;  . rt knee arthroscopic surgery    . SHOULDER SURGERY Bilateral    3 surgeries on on left, 2 surgeries on right    Family  History:  Family History  Problem Relation Age of Onset  . Cancer Father        bone  . Cancer Brother        lungs  . Stroke Maternal Grandmother   . Asthma Son        died at age 55 in his sleep   . Spina bifida Son        died at age 80   . Dementia Mother   . Colon cancer Neg Hx    Family Psychiatric  History: See H&P Social History:  Social History   Substance and Sexual Activity  Alcohol Use Yes  . Alcohol/week: 1.8 - 2.4 oz  . Types: 3 - 4 Cans of beer per week   Comment: everyday- 3-4 40 oz     Social History   Substance and Sexual Activity  Drug Use No   Comment: denied using any drugs    Social History   Socioeconomic History  . Marital status: Widowed    Spouse name: None  . Number of children: None  . Years of education: None  . Highest education level: None  Social Needs  . Financial resource strain: None  . Food insecurity - worry: None  . Food insecurity - inability: None  . Transportation needs - medical: None  . Transportation needs - non-medical: None  Occupational History  . Occupation: Disability  Tobacco Use  . Smoking status: Current Every Day Smoker    Packs/day: 2.00    Years: 52.00    Pack years: 104.00    Types: Cigarettes    Start date: 07/30/1966  . Smokeless tobacco: Never Used  . Tobacco comment: down to 1/4 ppd  Substance and Sexual Activity  . Alcohol use: Yes    Alcohol/week: 1.8 - 2.4 oz    Types: 3 - 4 Cans of beer per week    Comment: everyday- 3-4 40 oz  . Drug use: No    Comment: denied using any drugs  . Sexual activity: No  Other Topics Concern  . None  Social History Narrative  . None   Additional Social History:                         Sleep: Good  Appetite:  Good  Current Medications: Current Facility-Administered Medications  Medication Dose Route Frequency Provider Last Rate Last Dose  . acetaminophen (TYLENOL) tablet 650 mg  650 mg Oral Q6H PRN Ethelene Hal, NP   650 mg at  08/28/17 0748  . albuterol (PROVENTIL HFA;VENTOLIN HFA) 108 (90 Base) MCG/ACT inhaler 1-2 puff  1-2 puff Inhalation Q4H PRN Ethelene Hal, NP      . alum & mag hydroxide-simeth (MAALOX/MYLANTA) 200-200-20 MG/5ML suspension 30 mL  30 mL Oral Q4H PRN Ethelene Hal, NP      . buPROPion (WELLBUTRIN XL) 24 hr tablet 300 mg  300 mg Oral Daily Artist Beach, MD   300 mg at 08/28/17 0748  . feeding supplement (ENSURE ENLIVE) (ENSURE ENLIVE) liquid 237 mL  237 mL Oral BID BM Cobos, Fernando A, MD      . gabapentin (NEURONTIN) capsule 100 mg  100  mg Oral TID Ezrael Sam, Lowry Ram, FNP      . hydrOXYzine (ATARAX/VISTARIL) tablet 25 mg  25 mg Oral Q6H PRN Ethelene Hal, NP   25 mg at 08/28/17 0748  . magnesium hydroxide (MILK OF MAGNESIA) suspension 30 mL  30 mL Oral Daily PRN Ethelene Hal, NP      . nicotine (NICODERM CQ - dosed in mg/24 hours) patch 21 mg  21 mg Transdermal Daily Ethelene Hal, NP   21 mg at 08/28/17 0746  . tiotropium (SPIRIVA) inhalation capsule 18 mcg  18 mcg Inhalation Daily Ethelene Hal, NP   18 mcg at 08/28/17 0745  . traZODone (DESYREL) tablet 50 mg  50 mg Oral QHS PRN Ethelene Hal, NP   50 mg at 08/27/17 2120    Lab Results: No results found for this or any previous visit (from the past 48 hour(s)).  Blood Alcohol level:  Lab Results  Component Value Date   ETH 184 (H) 08/23/2017   ETH 48 (H) 87/56/4332    Metabolic Disorder Labs: Lab Results  Component Value Date   HGBA1C 5.4 06/26/2017   MPG 108 06/26/2017   MPG 105 08/03/2016   No results found for: PROLACTIN Lab Results  Component Value Date   CHOL 247 (H) 02/17/2013   TRIG 144 02/17/2013   HDL 79 02/17/2013   CHOLHDL 3.1 02/17/2013   VLDL 29 02/17/2013   LDLCALC 139 (H) 02/17/2013    Physical Findings: AIMS: Facial and Oral Movements Muscles of Facial Expression: None, normal Lips and Perioral Area: None, normal Jaw: None, normal Tongue: None,  normal,Extremity Movements Upper (arms, wrists, hands, fingers): None, normal Lower (legs, knees, ankles, toes): None, normal, Trunk Movements Neck, shoulders, hips: None, normal, Overall Severity Severity of abnormal movements (highest score from questions above): None, normal Incapacitation due to abnormal movements: None, normal Patient's awareness of abnormal movements (rate only patient's report): No Awareness, Dental Status Current problems with teeth and/or dentures?: Yes Does patient usually wear dentures?: Yes(usually wears but reports that he broke his dentures)  CIWA:  CIWA-Ar Total: 1 COWS:  COWS Total Score: 3  Musculoskeletal: Strength & Muscle Tone: within normal limits Gait & Station: normal Patient leans: N/A  Psychiatric Specialty Exam: Physical Exam  Nursing note and vitals reviewed. Constitutional: He is oriented to person, place, and time. He appears well-developed and well-nourished.  Respiratory: Effort normal.  Musculoskeletal: Normal range of motion.  Neurological: He is alert and oriented to person, place, and time.  Skin: Skin is warm.    Review of Systems  Constitutional: Negative.   HENT: Negative.   Eyes: Negative.   Respiratory: Negative.   Cardiovascular: Negative.   Gastrointestinal: Negative.   Genitourinary: Negative.   Musculoskeletal: Negative.   Skin: Positive for itching and rash.  Neurological: Negative.   Endo/Heme/Allergies: Negative.   Psychiatric/Behavioral: Positive for depression. Negative for hallucinations and suicidal ideas. The patient is not nervous/anxious.     Blood pressure 116/74, pulse (!) 102, temperature 97.6 F (36.4 C), temperature source Oral, resp. rate 16, height 5\' 3"  (1.6 m), weight 63.5 kg (140 lb).Body mass index is 24.8 kg/m.  General Appearance: Casual  Eye Contact:  Good  Speech:  Clear and Coherent and Normal Rate  Volume:  Normal  Mood:  Depressed  Affect:  Flat  Thought Process:  Goal Directed  and Descriptions of Associations: Intact  Orientation:  Full (Time, Place, and Person)  Thought Content:  WDL  Suicidal Thoughts:  No  Homicidal Thoughts:  No  Memory:  Immediate;   Good Recent;   Good Remote;   Good  Judgement:  Good  Insight:  Good  Psychomotor Activity:  Normal  Concentration:  Concentration: Good and Attention Span: Good  Recall:  Good  Fund of Knowledge:  Good  Language:  Good  Akathisia:  No  Handed:  Right  AIMS (if indicated):     Assets:  Communication Skills Desire for Improvement Financial Resources/Insurance Social Support  ADL's:  Intact  Cognition:  WNL  Sleep:  Number of Hours: 6.5   Problems Addressed: MDD severe Neuropathic pain  Treatment Plan Summary: Daily contact with patient to assess and evaluate symptoms and progress in treatment, Medication management and Plan is to:  -Start Gabapentin 100 mg PO TID for neuropathic pain -Continue Wellbutrin XL 300 mg PO Daily for mood stability -Continue Trazodone 50 mg PO QHS PRN for insomnia -Continue Vistaril 25 mg PO Q6H PRN for itching -Encourage group therapy participation  Lewis Shock, FNP 08/28/2017, 2:57 PM

## 2017-08-28 NOTE — Progress Notes (Signed)
Nursing Progress Note: 7p-7a D: Pt currently presents with a sad/anxious/pleasant affect and behavior. Pt states "I need to get back to AA. I just been drinking so much since my wife died. I need a support system. It helps to be around others." Interacting appropriately with the milieu. Pt reports fair sleep during the previous night with current medication regimen. Pt did attend wrap-up group.  A: Pt provided with medications per providers orders. Pt's labs and vitals were monitored throughout the night. Pt supported emotionally and encouraged to express concerns and questions. Pt educated on medications.  R: Pt's safety ensured with 15 minute and environmental checks. Pt currently denies SI, HI, and AVH. Pt verbally contracts to seek staff if SI,HI, or AVH occurs and to consult with staff before acting on any harmful thoughts. Will continue to monitor.

## 2017-08-28 NOTE — Tx Team (Signed)
Interdisciplinary Treatment and Diagnostic Plan Update  08/28/2017 Time of Session: 0958 Samuel Reyes MRN: [id number]  Principal Diagnosis: MDD (major depressive disorder), recurrent severe, without psychosis ([place])  Secondary Diagnoses: Principal Problem:   MDD (major depressive disorder), recurrent severe, without psychosis ([place])   Current Medications:  Current Facility-Administered Medications  Medication Dose Route Frequency Provider Last Rate Last Dose  . acetaminophen (TYLENOL) tablet 650 mg  650 mg Oral Q6H PRN Ethelene Hal, NP   650 mg at 08/28/17 0748  . albuterol (PROVENTIL HFA;VENTOLIN HFA) 108 (90 Base) MCG/ACT inhaler 1-2 puff  1-2 puff Inhalation Q4H PRN Ethelene Hal, NP      . alum & mag hydroxide-simeth (MAALOX/MYLANTA) 200-200-20 MG/5ML suspension 30 mL  30 mL Oral Q4H PRN Ethelene Hal, NP      . buPROPion (WELLBUTRIN XL) 24 hr tablet 300 mg  300 mg Oral Daily Artist Beach, MD   300 mg at 08/28/17 0748  . feeding supplement (ENSURE ENLIVE) (ENSURE ENLIVE) liquid 237 mL  237 mL Oral BID BM Cobos, Fernando A, MD      . hydrOXYzine (ATARAX/VISTARIL) tablet 25 mg  25 mg Oral Q6H PRN Ethelene Hal, NP   25 mg at 08/28/17 0748  . magnesium hydroxide (MILK OF MAGNESIA) suspension 30 mL  30 mL Oral Daily PRN Ethelene Hal, NP      . nicotine (NICODERM CQ - dosed in mg/24 hours) patch 21 mg  21 mg Transdermal Daily Ethelene Hal, NP   21 mg at 08/28/17 0746  . tiotropium (SPIRIVA) inhalation capsule 18 mcg  18 mcg Inhalation Daily Ethelene Hal, NP   18 mcg at 08/28/17 0745  . traZODone (DESYREL) tablet 50 mg  50 mg Oral QHS PRN Ethelene Hal, NP   50 mg at 08/27/17 2120   PTA Medications: Medications Prior to Admission  Medication Sig Dispense Refill Last Dose  . acetaminophen (TYLENOL) 500 MG tablet Take 1,000 mg by mouth every 6 (six) hours as needed for mild pain, moderate pain, fever or headache.    08/23/2017 at Unknown time  . albuterol (PROVENTIL HFA;VENTOLIN HFA) 108 (90 Base) MCG/ACT inhaler Inhale 1-2 puffs into the lungs every 4 (four) hours as needed for wheezing. 1 Inhaler 1 08/23/2017 at Unknown time  . benzonatate (TESSALON) 100 MG capsule Take 2 capsules (200 mg total) by mouth every 8 (eight) hours. 21 capsule 0 08/23/2017 at Unknown time  . buPROPion (WELLBUTRIN SR) 200 MG 12 hr tablet Take 200 mg by mouth at bedtime.  0 08/22/2017 at Unknown time  . chlordiazePOXIDE (LIBRIUM) 25 MG capsule 50mg  PO TID x 1D, then 25-50mg  PO BID X 1D, then 25-50mg  PO QD X 1D 10 capsule 0 08/23/2017 at Unknown time  . meloxicam (MOBIC) 7.5 MG tablet Take 1 tablet (7.5 mg total) by mouth daily. 30 tablet 1 08/23/2017 at Unknown time  . metoprolol (LOPRESSOR) 50 MG tablet Take 1 tablet (50 mg total) by mouth 2 (two) times daily. 60 tablet 1 08/23/2017 at 1100  . Multiple Vitamin (MULTIVITAMIN WITH MINERALS) TABS tablet Take 1 tablet by mouth daily. 30 tablet 1 08/23/2017 at Unknown time  . permethrin (ELIMITE) 5 % cream Apply to affected area once 60 g 0 08/23/2017 at Unknown time  . [EXPIRED] predniSONE (DELTASONE) 10 MG tablet Take 4 tablets (40 mg total) by mouth daily for 5 days. 20 tablet 0 08/23/2017 at Unknown time  . SPIRIVA HANDIHALER 18 MCG inhalation capsule Place  1 puff into inhaler and inhale daily.  0 08/22/2017 at Unknown time  . traZODone (DESYREL) 100 MG tablet Take 1 tablet (100 mg total) by mouth at bedtime.   Past Week at Unknown time    Patient Stressors: Financial difficulties Health problems Loss of wife and sons Medication change or noncompliance Substance abuse  Patient Strengths: Ability for insight Average or above average intelligence Capable of independent living [employer] of knowledge Motivation for treatment/growth  Treatment Modalities: Medication Management, Group therapy, Case management,  1 to 1 session with clinician, Psychoeducation, Recreational  therapy.   Physician Treatment Plan for Primary Diagnosis: MDD (major depressive disorder), recurrent severe, without psychosis ([place]) Long Term Goal(s): Improvement in symptoms so as ready for discharge Improvement in symptoms so as ready for discharge   Short Term Goals: Ability to identify changes in lifestyle to reduce recurrence of condition will improve Ability to verbalize feelings will improve Ability to disclose and discuss suicidal ideas Ability to demonstrate self-control will improve Ability to identify and develop effective coping behaviors will improve Ability to maintain clinical measurements within normal limits will improve Compliance with prescribed medications will improve Ability to identify changes in lifestyle to reduce recurrence of condition will improve Ability to verbalize feelings will improve Ability to disclose and discuss suicidal ideas Ability to demonstrate self-control will improve Ability to identify and develop effective coping behaviors will improve Ability to maintain clinical measurements within normal limits will improve Compliance with prescribed medications will improve Ability to identify triggers associated with substance abuse/mental health issues will improve  Medication Management: Evaluate patient's response, side effects, and tolerance of medication regimen.  Therapeutic Interventions: 1 to 1 sessions, Unit Group sessions and Medication administration.  Evaluation of Outcomes: Progressing  Physician Treatment Plan for Secondary Diagnosis: Principal Problem:   MDD (major depressive disorder), recurrent severe, without psychosis ([place])  Long Term Goal(s): Improvement in symptoms so as ready for discharge Improvement in symptoms so as ready for discharge   Short Term Goals: Ability to identify changes in lifestyle to reduce recurrence of condition will improve Ability to verbalize feelings will improve Ability to disclose and discuss  suicidal ideas Ability to demonstrate self-control will improve Ability to identify and develop effective coping behaviors will improve Ability to maintain clinical measurements within normal limits will improve Compliance with prescribed medications will improve Ability to identify changes in lifestyle to reduce recurrence of condition will improve Ability to verbalize feelings will improve Ability to disclose and discuss suicidal ideas Ability to demonstrate self-control will improve Ability to identify and develop effective coping behaviors will improve Ability to maintain clinical measurements within normal limits will improve Compliance with prescribed medications will improve Ability to identify triggers associated with substance abuse/mental health issues will improve     Medication Management: Evaluate patient's response, side effects, and tolerance of medication regimen.  Therapeutic Interventions: 1 to 1 sessions, Unit Group sessions and Medication administration.  Evaluation of Outcomes: Progressing   RN Treatment Plan for Primary Diagnosis: MDD (major depressive disorder), recurrent severe, without psychosis ([place]) Long Term Goal(s): Knowledge of disease and therapeutic regimen to maintain health will improve  Short Term Goals: Ability to identify and develop effective coping behaviors will improve and Compliance with prescribed medications will improve  Medication Management: RN will administer medications as ordered by provider, will assess and evaluate patient's response and provide education to patient for prescribed medication. RN will report any adverse and/or side effects to prescribing provider.  Therapeutic Interventions: 1 on  1 counseling sessions, Psychoeducation, Medication administration, Evaluate responses to treatment, Monitor vital signs and CBGs as ordered, Perform/monitor CIWA, COWS, AIMS and Fall Risk screenings as ordered, Perform wound care treatments as  ordered.  Evaluation of Outcomes: Progressing   LCSW Treatment Plan for Primary Diagnosis: MDD (major depressive disorder), recurrent severe, without psychosis ([place]) Long Term Goal(s): Safe transition to appropriate next level of care at discharge, Engage patient in therapeutic group addressing interpersonal concerns.  Short Term Goals: Engage patient in aftercare planning with referrals and resources, Increase social support and Increase skills for wellness and recovery  Therapeutic Interventions: Assess for all discharge needs, 1 to 1 time with Social worker, Explore available resources and support systems, Assess for adequacy in community support network, Educate family and significant other(s) on suicide prevention, Complete Psychosocial Assessment, Interpersonal group therapy.  Evaluation of Outcomes: Progressing   Progress in Treatment: Attending groups: Yes. Participating in groups: Yes. Taking medication as prescribed: Yes. Toleration medication: Yes. Family/Significant other contact made: No, will contact:  refused Patient understands diagnosis: Yes. Discussing patient identified problems/goals with staff: Yes. Medical problems stabilized or resolved: Yes. Denies suicidal/homicidal ideation: Yes. Issues/concerns per patient self-inventory: No. Other: none  New problem(s) identified: No, Describe:  none  New Short Term/Long Term Goal(s): Pt goal: "learning how to stay away from alcohol and quit having negative thoughts about the past."  Discharge Plan or Barriers:   Reason for Continuation of Hospitalization: Depression Medication stabilization  Estimated Length of Stay: 3-5 days.  Attendees: Patient: Samuel Reyes 08/28/2017   Physician: Dr Sanjuana Letters, MD 08/28/2017   Nursing: Darrol Angel, RN 08/28/2017   RN Care Manager: 08/28/2017   Social Worker: Lurline Idol, [place] 08/28/2017   Recreational Therapist:  08/28/2017   Other:  08/28/2017   Other:  08/28/2017   Other:  08/28/2017        Scribe for Treatment Team: Joanne Chars, LCSW 08/28/2017 11:18 AM

## 2017-08-28 NOTE — Progress Notes (Signed)
Recreation Therapy Notes  Date: 08/28/17 Time: 0930 Location: [address] Dayroom  Group Topic: Stress Management  Goal Area(s) Addresses:  Patient will verbalize importance of using healthy stress management.  Patient will identify positive emotions associated with healthy stress management.   Behavioral Response: Engaged  Intervention: Stress Management  Activity :  Guided Imagery.  LRT introduced the stress management technique of guided imagery.  LRT read a script that allowed patients to take a stroll on the beach.  Patients were to follow along as LRT read script to engage in activity.  Education:  Stress Management, Discharge Planning.   Education Outcome: Acknowledges edcuation/In group clarification offered/Needs additional education  Clinical Observations/Feedback: Pt attended group.    [name], LRT/CTRS         [name] A 08/28/2017 11:35 AM

## 2017-08-28 NOTE — Progress Notes (Signed)
Patient ID: Samuel Reyes, male   DOB: April 02, 1956, 62 y.o.   MRN: [id number]  DAR: Pt. Denies SI/HI and A/V Hallucinations. He reports that his sleep last night was fair, his appetite is fair, his energy level is low, and his concentration is poor. He rates his depression level is 7/10, his hopelessness level is 4/10, and his anxiety level is 4/10. He denies any withdrawal symptoms. Patient continues to report pain in his head and receiving PRN Tylenol. Support and encouragement provided to the patient. Scheduled medication and PRN medication administered to patient per physician's orders. Patient is receptive and cooperative. He is seen in the milieu and is participating in his plan of care. He reports his goal for today is to, "learn more about my problem and how to fix it." He reports that he thinks about the past a lot and this depresses him. His affect is flat and mood is depressed. Q15 minute checks are maintained for safety.

## 2017-08-28 NOTE — Progress Notes (Signed)
Recreation Therapy Notes  Animal-Assisted Activity (AAA) Program Checklist/Progress Notes Patient Eligibility Criteria Checklist & Daily Group note for Rec TxIntervention  Date: 1.29.19 Time: 2:45 pm  Location: [address]   AAA/T Program Assumption of Risk Form signed by Patient/ or Parent Legal Guardian Yes  Patient is free of allergies or sever asthma Yes  Patient reports no fear of animals Yes  Patient reports no history of cruelty to animals Yes  Patient understands his/her participation is voluntary Yes  Patient washes hands before animal contact Yes  Patient washes hands after animal contact Yes  Behavioral Response: Did not attend   Ranell Patrick, Recreation Therapy Intern  Ranell Patrick 08/28/2017 9:37 AM

## 2017-08-28 NOTE — BHH Group Notes (Signed)
LCSW Group Therapy Note 08/28/2017 2:58 PM  Type of Therapy/Topic: Group Therapy: Emotion Regulation  Participation Level: Active   Description of Group:  The purpose of this group is to assist patients in learning to regulate negative emotions and experience positive emotions. Patients will be guided to discuss ways in which they have been vulnerable to their negative emotions. These vulnerabilities will be juxtaposed with experiences of positive emotions or situations, and patients will be challenged to use positive emotions to combat negative ones. Special emphasis will be placed on coping with negative emotions in conflict situations, and patients will process healthy conflict resolution skills.  Therapeutic Goals: 1. Patient will identify two positive emotions or experiences to reflect on in order to balance out negative emotions 2. Patient will label two or more emotions that they find the most difficult to experience 3. Patient will demonstrate positive conflict resolution skills through discussion and/or role plays  Summary of Patient Progress:  Samuel Reyes was engaged throughout the entire group session. He participated and contributed to the group's discussion regarding emotional regulation. Meet stated that he feels that his main issue with emotional regulation is not having anyone to talk to. Samuel Reyes states that once he is discharged from the hospital, he plans to find someone that is willing to listen and understand him to increase his emotional regulation.   Therapeutic Modalities:  Cognitive Behavioral Therapy Feelings Identification Dialectical Behavioral Therapy   Theresa Duty Clinical Social Worker

## 2017-08-28 NOTE — BHH Group Notes (Signed)
[facility] Mental Health Association Group Therapy 08/28/2017 1:15pm  Type of Therapy: Mental Health Association Presentation  Participation Level: Invited. DID NOT ATTEND. Pt chose to remain in bed.   Summary of Progress/Problems: [facility]) Speaker came to talk about his personal journey with mental health. The pt processed ways by which to relate to the speaker. [place] speaker provided handouts and educational information pertaining to groups and services offered by the [facility]. Pt was engaged in speaker's presentation and was receptive to resources provided.    [employer], LCSW 08/28/2017 10:19 AM

## 2017-08-28 NOTE — Addendum Note (Signed)
Addended by: Charlott Rakes on: 08/28/2017 08:45 AM   Modules accepted: Level of Service

## 2017-08-29 ENCOUNTER — Telehealth: Payer: Self-pay | Admitting: Family Medicine

## 2017-08-29 MED ORDER — TIOTROPIUM BROMIDE MONOHYDRATE 18 MCG IN CAPS: 18.0000 ug | ORAL_CAPSULE | Freq: Every day | RESPIRATORY_TRACT | 0 refills | Status: DC

## 2017-08-29 MED ORDER — GABAPENTIN 100 MG PO CAPS: 100.0000 mg | ORAL_CAPSULE | Freq: Three times a day (TID) | ORAL | 0 refills | Status: DC

## 2017-08-29 MED ORDER — HYDROXYZINE HCL 25 MG PO TABS: 25.0000 mg | ORAL_TABLET | Freq: Four times a day (QID) | ORAL | 0 refills | Status: DC | PRN

## 2017-08-29 MED ORDER — TRAZODONE HCL 50 MG PO TABS: 50.0000 mg | ORAL_TABLET | Freq: Every evening | ORAL | 0 refills | Status: DC | PRN

## 2017-08-29 MED ORDER — BUPROPION HCL ER (XL) 300 MG PO TB24: 300.0000 mg | ORAL_TABLET | Freq: Every day | ORAL | 0 refills | Status: DC

## 2017-08-29 MED ORDER — ALBUTEROL SULFATE HFA 108 (90 BASE) MCG/ACT IN AERS: 1.0000 | INHALATION_SPRAY | RESPIRATORY_TRACT | 0 refills | Status: DC | PRN

## 2017-08-29 NOTE — Telephone Encounter (Signed)
Call placed to [facility], [place] [phone], to inform her that I was going to cancel patient's appointment for tomorrow 2/1 with provider since patient was admitted to [facility]. [place] agreed on it.   [place] hopes that [employer] will send patient to an inpatient facility and not to [facility], to help patient with his alcohol abuse which leads to his suicidal ideations. Director and [place] agree that patient is a danger to himself and others therefore has been dismissed from staying overnight at the [facility]. Patient can only be in the lobby area. If patient happens to go by the facility in the days to come, [place] will have patient reschedule his office visit.

## 2017-08-29 NOTE — BHH Group Notes (Signed)
Adult Psychoeducational Group Note  Date:  08/29/2017 Time:  10:50 AM  Group Topic/Focus:  Goals Group:   The focus of this group is to help patients establish daily goals to achieve during treatment and discuss how the patient can incorporate goal setting into their daily lives to aide in recovery.  Participation Level:  Active  Participation Quality:  Appropriate  Affect:  Appropriate  Cognitive:  Alert  Insight: Appropriate  Engagement in Group:  Engaged  Modes of Intervention:  Orientation  Additional Comments:  Pt was alert and engaged in orientation group. Pt goal for today is to stay focus and positive.  Huel Cote 08/29/2017, 10:50 AM

## 2017-08-29 NOTE — Progress Notes (Signed)
Adult Psychoeducational Group Note  Date:  08/29/2017 Time: 1600  Group Topic/Focus:  Relaxation Group  Using relaxation techniques to cope with anxiety.  Participation Level:  Active  Participation Quality:  Appropriate  Affect:  Appropriate  Cognitive:  Appropriate  Insight: Appropriate  Engagement in Group:  Engaged  Modes of Intervention:  Discussion and Education  Additional Comments:    Takoya Jonas L 08/29/2017, 4:41 PM

## 2017-08-29 NOTE — Discharge Summary (Signed)
Physician Discharge Summary Note  Patient:  Samuel Reyes is an 62 y.o., male MRN:  [id number] DOB:  1956/04/15 Patient phone:  [phone] (home)  Patient address:   [place] [address],  Total Time spent with patient: 20 minutes  Date of Admission:  08/26/2017 Date of Discharge: 08/30/17  Reason for Admission:  Worsening depression with SI  Principal Problem: MDD (major depressive disorder), recurrent severe, without psychosis [facility]) Discharge Diagnoses: Patient Active Problem List   Diagnosis Date Noted  . MDD (major depressive disorder), recurrent severe, without psychosis ([place]) [F33.2] 08/26/2017  . Alcohol withdrawal ([place]) [F10.239] 08/08/2017  . COPD with acute bronchitis ([place]) [J44.0, J20.9] 07/29/2017  . Alcohol use [Z78.9]   . Homelessness [Z59.0] 06/24/2017  . Lung nodule [R91.1] 09/03/2016  . Tobacco abuse [Z72.0] 09/03/2016  . Insomnia [G47.00] 09/03/2016  . HCAP (healthcare-associated pneumonia) [J18.9] 08/22/2016  . Bronchiolitis [J21.9] 08/21/2016  . Malnutrition of moderate degree [E44.0] 08/04/2016  . Atrial tachycardia ([place]) [I47.1] 08/02/2016  . Impaired glucose tolerance [R73.02] 08/02/2016  . Alcohol abuse with alcohol-induced mood disorder ([place]) [F10.14] 12/12/2015  . Syncope [R55] 11/15/2015  . Chest pain [R07.9] 11/15/2015  . Nausea vomiting and diarrhea [R11.2, R19.7] 11/15/2015  . Abdominal pain [R10.9] 11/15/2015  . Major depressive disorder, recurrent episode, moderate with anxious distress ([place]) [F33.1] 10/30/2015  . Alcohol use disorder, severe, dependence ([place]) [F10.20] 10/29/2015  . COPD exacerbation ([place]) [J44.1] 09/20/2015  . Acute respiratory failure with hypoxia ([place]) [J96.01] 09/18/2015  . Suicidal ideation [R45.851] 09/17/2015  . Alcohol intoxication ([place]) [F10.929] 09/17/2015  . Depression [F32.9] 09/17/2015  . Benign essential HTN [I10] 09/17/2015  . Hypokalemia [E87.6] 09/17/2015  . Hyponatremia [E87.1]  09/17/2015  . Coffee ground emesis [K92.0] 09/17/2015  . COPD (chronic obstructive pulmonary disease) ([place]) [J44.9] 02/17/2013  . GERD (gastroesophageal reflux disease) [K21.9] 02/17/2013    Past Psychiatric History: SUD. Patient has been in rehab on multiple occassions in the past. He has had accidental overdoses in the past. He denies any active suicidal attempt. He denies any past history of violent behavior. He is currently on Bupropion.    Past Medical History:  Past Medical History:  Diagnosis Date  . Alcoholism ([place])   . CAD (coronary artery disease) 2002   MI, no intervention required  . COPD (chronic obstructive pulmonary disease) (H[facility])    not on home O2  . Depression   . GERD (gastroesophageal reflux disease)   . Headache   . Hypertension   . Stroke [facility]) 2006    Past Surgical History:  Procedure Laterality Date  . BACK SURGERY     3 cervical spine surgeries C4-C5 fused  . COLONOSCOPY N/A 01/04/2014   Procedure: COLONOSCOPY;  Surgeon: Danie Binder, MD;  Location: [facility];  Service: Endoscopy;  Laterality: N/A;  1:45  . ESOPHAGOGASTRODUODENOSCOPY N/A 01/04/2014   Procedure: ESOPHAGOGASTRODUODENOSCOPY (EGD);  Surgeon: Danie Binder, MD;  Location: [facility];  Service: Endoscopy;  Laterality: N/A;  . FINGER SURGERY Left    2nd, 3rd, & 4th fingers were cut off by table saw and reattached  . GASTRECTOMY    . HERNIA REPAIR    . INCISIONAL HERNIA REPAIR N/A 01/20/2014   Procedure: LAPAROSCOPIC RECURRENT  INCISIONAL HERNIA with mesh;  Surgeon: Edward Jolly, MD;  Location: [facility] ORS;  Service: General;  Laterality: N/A;  . rt knee arthroscopic surgery    . SHOULDER SURGERY Bilateral    3 surgeries on on left, 2  surgeries on right    Family History:  Family History  Problem Relation Age of Onset  . Cancer Father        bone  . Cancer Brother        lungs  . Stroke Maternal Grandmother   . Asthma Son        died at age 35 in his sleep   . Spina bifida Son         died at age 48   . Dementia Mother   . Colon cancer Neg Hx    Family Psychiatric  History: Denies Social History:  Social History   Substance and Sexual Activity  Alcohol Use Yes  . Alcohol/week: 1.8 - 2.4 oz  . Types: 3 - 4 Cans of beer per week   Comment: everyday- 3-4 40 oz     Social History   Substance and Sexual Activity  Drug Use No   Comment: denied using any drugs    Social History   Socioeconomic History  . Marital status: Widowed    Spouse name: None  . Number of children: None  . Years of education: None  . Highest education level: None  Social Needs  . Financial resource strain: None  . Food insecurity - worry: None  . Food insecurity - inability: None  . Transportation needs - medical: None  . Transportation needs - non-medical: None  Occupational History  . Occupation: Disability  Tobacco Use  . Smoking status: Current Every Day Smoker    Packs/day: 2.00    Years: 52.00    Pack years: 104.00    Types: Cigarettes    Start date: 07/30/1966  . Smokeless tobacco: Never Used  . Tobacco comment: down to 1/4 ppd  Substance and Sexual Activity  . Alcohol use: Yes    Alcohol/week: 1.8 - 2.4 oz    Types: 3 - 4 Cans of beer per week    Comment: everyday- 3-4 40 oz  . Drug use: No    Comment: denied using any drugs  . Sexual activity: No  Other Topics Concern  . None  Social History Narrative  . None    Hospital Course:   08/27/17 [facility] MD Assessment: 62 y.o Caucasian male, widower, homeless, on SSID. Background history of AUD. Presented to the ER repeatedly in the past couple of weeks. Current presentation was via EMS. He was intoxicated with alcohol. BAL184 mg/dl  He overdose on twenty seven pills. He was ruminating on is losses at that time. Patient was managed at the ER for some days and referred here for stabilization.  At interview, patient reports that he drinks on a daily basis. Says that is the way he has been coping with his losses. He lost  a son 28 years ago. His other son passed six years ago and then his wife four years ago. Says when drunk, he begins to feel there is no point going on. Patient says he has no recollection of taking the OD.  No goodbye messages before he took the OD. No measures to avoid being detected. Friends who were there called EMS. He does not know what he took. He did not research  the effects of the medications on the body.  Patient is remorseful. No current suicidal thoughts. No residual cognitive dulling. Patient says he wants to stay sober. Says his spirits are better now he is sober. He is not feeling as tired as he used to feel. His thoughts are now clearer.  No associated psychosis. No evidence of mania. No overwhelming anxiety. No evidence of PTSD. No substance use. No thoughts of harming others. No thoughts of violence. No access to weapons. No other stressors at this time.   Patient remained on the [facility] unit for 3 days and stabilized with medication, sobriety, and therapy. Patient was monitored for withdrawals. Patient was started on Wellbutrin XL 300 mg Daily, Gabapentin 100 mg TID, Vistaril 25 mg Q6H PRN, and Trazodone 50 mg QHS PRN. Patient showed improvement with improved mood, affect, sleep, appetite, and interaction. Patient has been seen in the day room interacting with peers and staff appropriately. Patient has been attending groups and participating. Patient agrees to go to [facility] and then will follow up at [facility]. Patient denies any SI/HI/AVH and contracts for safety. He is provided with prescriptions and 14 day samples for his medications upon discharge.   Physical Findings: AIMS: Facial and Oral Movements Muscles of Facial Expression: None, normal Lips and Perioral Area: None, normal Jaw: None, normal Tongue: None, normal,Extremity Movements Upper (arms, wrists, hands, fingers): None, normal Lower (legs, knees, ankles, toes): None, normal, Trunk Movements Neck, shoulders, hips:  None, normal, Overall Severity Severity of abnormal movements (highest score from questions above): None, normal Incapacitation due to abnormal movements: None, normal Patient's awareness of abnormal movements (rate only patient's report): No Awareness, Dental Status Current problems with teeth and/or dentures?: Yes Does patient usually wear dentures?: Yes(usually wears but reports that he broke his dentures)  CIWA:  CIWA-Ar Total: 1 COWS:  COWS Total Score: 3  Musculoskeletal: Strength & Muscle Tone: within normal limits Gait & Station: normal Patient leans: N/A  Psychiatric Specialty Exam: Physical Exam  Nursing note and vitals reviewed. Constitutional: He is oriented to person, place, and time. He appears well-developed and well-nourished.  Respiratory: Effort normal.  Musculoskeletal: Normal range of motion.  Neurological: He is alert and oriented to person, place, and time.  Skin: Skin is warm.    Review of Systems  Constitutional: Negative.   HENT: Negative.   Eyes: Negative.   Respiratory: Negative.   Cardiovascular: Negative.   Gastrointestinal: Negative.   Genitourinary: Negative.   Musculoskeletal: Negative.   Skin: Negative.   Neurological: Negative.   Endo/Heme/Allergies: Negative.   Psychiatric/Behavioral: Negative.     Blood pressure 105/71, pulse (!) 109, temperature 97.9 F (36.6 C), temperature source Oral, resp. rate 16, height 5\' 3"  (1.6 m), weight 63.5 kg (140 lb).Body mass index is 24.8 kg/m.  General Appearance: Casual  Eye Contact:  Good  Speech:  Clear and Coherent and Normal Rate  Volume:  Normal  Mood:  Euthymic  Affect:  Congruent  Thought Process:  Goal Directed and Descriptions of Associations: Intact  Orientation:  Full (Time, Place, and Person)  Thought Content:  WDL  Suicidal Thoughts:  No  Homicidal Thoughts:  No  Memory:  Immediate;   Good Recent;   Good Remote;   Good  Judgement:  Good  Insight:  Good  Psychomotor Activity:   Normal  Concentration:  Concentration: Good and Attention Span: Good  Recall:  Good  Fund of Knowledge:  Good  Language:  Good  Akathisia:  No  Handed:  Right  AIMS (if indicated):     Assets:  Communication Skills Desire for Improvement Financial Resources/Insurance Social Support  ADL's:  Intact  Cognition:  WNL  Sleep:  Number of Hours: 6.75     Have you used any form of tobacco in the last 30 days? (Cigarettes,  Smokeless Tobacco, Cigars, and/or Pipes): Yes  Has this patient used any form of tobacco in the last 30 days? (Cigarettes, Smokeless Tobacco, Cigars, and/or Pipes) Yes, Yes, A prescription for an FDA-approved tobacco cessation medication was offered at discharge and the patient refused  Blood Alcohol level:  Lab Results  Component Value Date   ETH 184 (H) 08/23/2017   ETH 48 (H) 69/48/5462    Metabolic Disorder Labs:  Lab Results  Component Value Date   HGBA1C 5.4 06/26/2017   MPG 108 06/26/2017   MPG 105 08/03/2016   No results found for: PROLACTIN Lab Results  Component Value Date   CHOL 247 (H) 02/17/2013   TRIG 144 02/17/2013   HDL 79 02/17/2013   CHOLHDL 3.1 02/17/2013   VLDL 29 02/17/2013   LDLCALC 139 (H) 02/17/2013    See Psychiatric Specialty Exam and Suicide Risk Assessment completed by Attending Physician prior to discharge.  Discharge destination:  [facility]  Is patient on multiple antipsychotic therapies at discharge:  No   Has Patient had three or more failed trials of antipsychotic monotherapy by history:  No  Recommended Plan for Multiple Antipsychotic Therapies: NA   Allergies as of 08/29/2017      Reactions   Bee Venom Anaphylaxis   Penicillins Rash   Has patient had a PCN reaction causing immediate rash, facial/tongue/throat swelling, SOB or lightheadedness with hypotension: {Yes Has patient had a PCN reaction causing severe rash involving mucus membranes or skin necrosis: YES Has patient had a PCN reaction that  required hospitalization Yes Has patient had a PCN reaction occurring within the last 10 years: YES If all of the above answers are "NO", then may proceed with Cephalosporin use.      Medication List    STOP taking these medications   acetaminophen 500 MG tablet Commonly known as:  TYLENOL   benzonatate 100 MG capsule Commonly known as:  TESSALON   buPROPion 200 MG 12 hr tablet Commonly known as:  WELLBUTRIN SR Replaced by:  buPROPion 300 MG 24 hr tablet   chlordiazePOXIDE 25 MG capsule Commonly known as:  LIBRIUM   meloxicam 7.5 MG tablet Commonly known as:  MOBIC   metoprolol tartrate 50 MG tablet Commonly known as:  LOPRESSOR   multivitamin with minerals Tabs tablet   permethrin 5 % cream Commonly known as:  ELIMITE   predniSONE 10 MG tablet Commonly known as:  DELTASONE     TAKE these medications     Indication  albuterol 108 (90 Base) MCG/ACT inhaler Commonly known as:  PROVENTIL HFA;VENTOLIN HFA Inhale 1-2 puffs into the lungs every 4 (four) hours as needed for wheezing.  Indication:  Asthma   buPROPion 300 MG 24 hr tablet Commonly known as:  WELLBUTRIN XL Take 1 tablet (300 mg total) by mouth daily. For mood control Start taking on:  08/30/2017 Replaces:  buPROPion 200 MG 12 hr tablet  Indication:  mood stability   gabapentin 100 MG capsule Commonly known as:  NEURONTIN Take 1 capsule (100 mg total) by mouth 3 (three) times daily.  Indication:  Agitation, Alcohol Withdrawal Syndrome, Neuropathic Pain   hydrOXYzine 25 MG tablet Commonly known as:  ATARAX/VISTARIL Take 1 tablet (25 mg total) by mouth every 6 (six) hours as needed for itching.  Indication:  Feeling Anxious, Itching due to Histamine   tiotropium 18 MCG inhalation capsule Commonly known as:  SPIRIVA HANDIHALER Place 1 capsule (18 mcg total) into inhaler and inhale daily. Start taking on:  08/30/2017 What changed:  how much to take  Indication:  Chronic Obstructive Lung Disease    traZODone 50 MG tablet Commonly known as:  DESYREL Take 1 tablet (50 mg total) by mouth at bedtime as needed for sleep. What changed:    medication strength  how much to take  when to take this  reasons to take this  Indication:  Trouble Sleeping      Follow-up Information    Services, [facility]. Go on 08/30/2017.   Why:  Please attend your intake appt at [facility] on Friday, 08/30/17, at 745am. Contact information: [address] [place] [address] [phone]        [facility]. Go in 3 day(s).   Specialty:  Behavioral Health Why:  Please attend a walk in appt within 3 days of your discharge from [facility] and request medication management services. Contact information: [address] [place] [address] [phone]           Follow-up recommendations:  Continue activity as tolerated. Continue diet as recommended by your PCP. Ensure to keep all appointments with outpatient providers.  Comments:  Patient is instructed prior to discharge to: Take all medications as prescribed by his/her mental healthcare provider. Report any adverse effects and or reactions from the medicines to his/her outpatient provider promptly. Patient has been instructed & cautioned: To not engage in alcohol and or illegal drug use while on prescription medicines. In the event of worsening symptoms, patient is instructed to call the crisis hotline, 911 and or go to the nearest ED for appropriate evaluation and treatment of symptoms. To follow-up with his/her primary care provider for your other medical issues, concerns and or health care needs.    Signed: [place], FNP 08/29/2017, 11:59 AM

## 2017-08-29 NOTE — Progress Notes (Signed)
[place] taxi reserved for 7:15am tomorrow, 08/30/17, to take pt to [facility].  CSW also spoke to Lisette Abu, Therapist, sports at [employer], who had sent email with concerns regarding pt.  CSW updated her on pt plan to go to [facility] and asked about clothing.  She will see what she can do to get pt clothing to him either at [facility] this afternoon or straight to [facility]. Winferd Humphrey, MSW, LCSW Clinical Social Worker 08/29/2017 12:55 PM

## 2017-08-29 NOTE — Progress Notes (Signed)
Nursing Progress Note: 7p-7a D: Pt currently presents with a sad/anxious/pleasant affect and behavior. Pt states "I am ready to go to [facility] and start on the right path." Interacting appropriately with the milieu. Pt reports fair sleep during the previous night with current medication regimen. Pt did attend wrap-up group.  A: Pt provided with medications per providers orders. Pt's labs and vitals were monitored throughout the night. Pt supported emotionally and encouraged to express concerns and questions. Pt educated on medications.  R: Pt's safety ensured with 15 minute and environmental checks. Pt currently denies SI, HI, and AVH. Pt verbally contracts to seek staff if SI,HI, or AVH occurs and to consult with staff before acting on any harmful thoughts. Will continue to monitor.

## 2017-08-29 NOTE — Progress Notes (Signed)
D:  Patient denied SI and HI, contracts for safety.   Denied A/V hallucinations.   A:  Medications administered per MD orders.  Emotional support and encouragement given patient. R:  Safety maintained with 15 minute checks.  Patient has been programming on 300 hall, attending groups.  Patient has been cooperative and pleasant.   Patient looking forward to discharge tomorrow morning to [facility].

## 2017-08-29 NOTE — Progress Notes (Signed)
Pt program on 300 hall.

## 2017-08-29 NOTE — Progress Notes (Signed)
  [facility] Adult Case Management Discharge Plan :  Will you be returning to the same living situation after discharge:  No.Pt will be going to [facility] residential. At discharge, do you have transportation home?: No.Taxi provided to [facility]. Do you have the ability to pay for your medications: No. Medications provided.  Release of information consent forms completed and in the chart;  Patient's signature needed at discharge.  Patient to Follow up at: Follow-up Information    [facility]. Go on 08/30/2017.   Why:  Please attend your intake appt at [facility] on Friday, 08/30/17, at 745am. Contact information: [address] [place] [address] [phone]        [facility]. Go in 3 day(s).   Specialty:  Behavioral Health Why:  Please attend a walk in appt within 3 days of your discharge from [facility] and request medication management services. Contact information: [place] [address] [phone]           Next level of care provider has access to [place] and Suicide Prevention discussed: No. Pt declined.  Have you used any form of tobacco in the last 30 days? (Cigarettes, Smokeless Tobacco, Cigars, and/or Pipes): Yes  Has patient been referred to the Quitline?: Patient refused referral  Patient has been referred for addiction treatment: Yes  Joanne Chars, [place] 08/29/2017, 2:55 PM

## 2017-08-29 NOTE — BHH Suicide Risk Assessment (Signed)
[facility] Discharge Suicide Risk Assessment   Principal Problem: MDD (major depressive disorder), recurrent severe, without psychosis ([place]) Discharge Diagnoses: Substance Induced Mood Disorder Patient Active Problem List   Diagnosis Date Noted  . MDD (major depressive disorder), recurrent severe, without psychosis ([place]) [F33.2] 08/26/2017  . Alcohol withdrawal ([place]) [F10.239] 08/08/2017  . COPD with acute bronchitis ([place]) [J44.0, J20.9] 07/29/2017  . Alcohol use [Z78.9]   . Homelessness [Z59.0] 06/24/2017  . Lung nodule [R91.1] 09/03/2016  . Tobacco abuse [Z72.0] 09/03/2016  . Insomnia [G47.00] 09/03/2016  . HCAP (healthcare-associated pneumonia) [J18.9] 08/22/2016  . Bronchiolitis [J21.9] 08/21/2016  . Malnutrition of moderate degree [E44.0] 08/04/2016  . Atrial tachycardia ([place]) [I47.1] 08/02/2016  . Impaired glucose tolerance [R73.02] 08/02/2016  . Alcohol abuse with alcohol-induced mood disorder ([place]) [F10.14] 12/12/2015  . Syncope [R55] 11/15/2015  . Chest pain [R07.9] 11/15/2015  . Nausea vomiting and diarrhea [R11.2, R19.7] 11/15/2015  . Abdominal pain [R10.9] 11/15/2015  . Major depressive disorder, recurrent episode, moderate with anxious distress ([place]) [F33.1] 10/30/2015  . Alcohol use disorder, severe, dependence ([place]) [F10.20] 10/29/2015  . COPD exacerbation ([place]) [J44.1] 09/20/2015  . Acute respiratory failure with hypoxia ([place]) [J96.01] 09/18/2015  . Suicidal ideation [R45.851] 09/17/2015  . Alcohol intoxication ([place]) [F10.929] 09/17/2015  . Depression [F32.9] 09/17/2015  . Benign essential HTN [I10] 09/17/2015  . Hypokalemia [E87.6] 09/17/2015  . Hyponatremia [E87.1] 09/17/2015  . Coffee ground emesis [K92.0] 09/17/2015  . COPD (chronic obstructive pulmonary disease) ([place]) [J44.9] 02/17/2013  . GERD (gastroesophageal reflux disease) [K21.9] 02/17/2013    Total Time spent with patient: 45 minutes  Musculoskeletal: Strength & Muscle Tone: within normal limits Gait &  Station: normal Patient leans: N/A  Psychiatric Specialty Exam: Review of Systems  Constitutional: Negative.   HENT: Negative.   Eyes: Negative.   Respiratory: Negative.   Cardiovascular: Negative.   Gastrointestinal: Negative.   Genitourinary: Negative.   Musculoskeletal: Negative.   Skin: Negative.   Neurological: Negative.   Endo/Heme/Allergies: Negative.   Psychiatric/Behavioral: Negative for depression, hallucinations, substance abuse and suicidal ideas. The patient is not nervous/anxious and does not have insomnia.     Blood pressure 122/70, pulse 96, temperature 97.9 F (36.6 C), temperature source Oral, resp. rate 16, height 5\' 3"  (1.6 m), weight 63.5 kg (140 lb).Body mass index is 24.8 kg/m.  General Appearance: Neatly dressed, pleasant, engaging well and cooperative. Appropriate behavior. Not in any distress. Good relatedness. Not internally stimulated  Eye Contact::  Good  Speech:  Spontaneous, normal prosody. Normal tone and rate.   Volume:  Normal  Mood:  Euthymic  Affect:  Appropriate and Full Range  Thought Process:  Linear  Orientation:  Full (Time, Place, and Person)  Thought Content:  No delusional theme. No preoccupation with violent thoughts. No negative ruminations. No obsession.  No hallucination in any modality.   Suicidal Thoughts:  No  Homicidal Thoughts:  No  Memory:  Immediate;   Good Recent;   Good Remote;   Good  Judgement:  Good  Insight:  Good  Psychomotor Activity:  Normal  Concentration:  Good  Recall:  Good  Fund of Knowledge:Good  Language: Good  Akathisia:  Negative  Handed:    AIMS (if indicated):     Assets:  Communication Skills Desire for Improvement Resilience  Sleep:  Number of Hours: 6.75  Cognition: WNL  ADL's:  Intact   Clinical  Assessment::   62 y.o Caucasian male, widower, homeless, on SSID. Background history of AUD. Presented to the ER  repeatedly in the past couple of weeks. Current presentation was via EMS. He was  intoxicated with alcohol. BAL184 mg/dl  He overdose on twenty seven pills. He was ruminating on is losses at that time. Patient was managed at the ER for some days and referred here for stabilization.  Nursing staff reports that patient has been appropriate on the unit. Patient has been interacting well with peers. No behavioral issues. Patient has not voiced any suicidal thoughts. Patient has not been observed to be internally stimulated. Patient has been adherent with treatment recommendations. Patient has been tolerating their medication well.   Seen today. Has completed detoxed. Reports that he is in good spirits. Not feeling depressed. Reports normal energy and interest. Has been maintaining normal biological functions. He is able to think clearly. He is able to focus on task. His thoughts are not crowded or racing. No evidence of mania. No hallucination in any modality. He is not making any delusional statement. No passivity of will/thought. He is fully in touch with reality. No thoughts of suicide. No thoughts of homicide. No violent thoughts. No overwhelming anxiety. No access to weapons.  Patient was discussed at team. Team members feels that patient is back to his baseline level of function. Team agrees with plan to discharge patient today.    Demographic Factors:  Male, Caucasian, Low socioeconomic status and Unemployed  Loss Factors: NA  Historical Factors: Impulsivity  Risk Reduction Factors:   Sense of responsibility to family, Living with another person, especially a relative, Positive social support, Positive therapeutic relationship and Positive coping skills or problem solving skills  Continued Clinical Symptoms:  As above  Cognitive Features That Contribute To Risk:  None    Suicide Risk:  Minimal: No identifiable suicidal ideation.  Patient is not having any thoughts of suicide at this time. Modifiable risk factors targeted during this admission includes depression  and substance use. Demographical and historical risk factors cannot be modified. Patient is now engaging well. Patient is reliable and is future oriented. We have buffered patient's support structures. At this point, patient is at low risk of suicide. Patient is aware of the effects of psychoactive substances on decision making process. Patient has been provided with emergency contacts. Patient acknowledges to use resources provided if unforseen circumstances changes their current risk stratification.    Follow-up Information    Services, [facility]. Go on 08/30/2017.   Why:  Please attend your intake appt at [facility] on Friday, 08/30/17, at 745am. Contact information: [address] [place] [address] [phone]        [facility]. Go in 3 day(s).   Specialty:  Behavioral Health Why:  Please attend a walk in appt within 3 days of your discharge from [facility] and request medication management services. Contact information: [place] [address] [phone]           Plan Of Care/Follow-up recommendations:  1. Continue current psychotropic medications 2. Mental health and addiction follow up as arranged.  3. Provided limited quantity of prescriptions   [place], MD 08/29/2017, 5:43 PM

## 2017-08-30 ENCOUNTER — Inpatient Hospital Stay: Payer: Self-pay | Admitting: Family Medicine

## 2017-08-30 ENCOUNTER — Telehealth: Payer: Self-pay | Admitting: Family Medicine

## 2017-08-30 NOTE — Telephone Encounter (Signed)
Received an e-mail from [place], Scientist, research (physical sciences), regarding assisting patient with housing once he is discharged from [facility].  Called placed to [facility] [phone], to check on the status of patient and to speak with staff member about housing. Spoke with Miss June and she informed me that that patient is at their facility but has not been admitted at this time because he doesn't have all his medications. The nurse at [facility] is aware of this and they informed [facility] staff member that someone will be going to drop off the medication today at their location. Miss June did not give me details (name) but she stated that the nurse at [facility] will wait until 11am for medication to be delivered. That's the cut off.   Sent an e-mail to [place] (case Freight forwarder) and Delana Meyer (social woker) about my conversation with June. Also informed [place] that [place] and I can help patient with housing in the [place] area since he can't go to the [employer] and there's a waiting list for [employer]. Miss June from [facility] also informed me that they have staff members at their facility that can also help patient with housing.

## 2017-08-30 NOTE — Telephone Encounter (Signed)
Call placed to [place] to inform her of my conversation with Miss June from [place]. [place] asked that patient shouldn't be placed in any housing facitilites in [place] due to his condition, in case he doesn't get admitted to [facility]. Informed [place] that I would keep her posted on any updates.  Called placed to [facility] and spoke with Miss June. She informed me that they received patient's medication and patient got admitted into a 30-90 day program. Length of time in the facility will now depend on patient and his progress.   Sending [place] a e-mail with update of my conversation with Miss June.

## 2017-08-30 NOTE — Progress Notes (Signed)
Pt discharged from unit after AVS, transition report, prescriptions, and suicide risk assessment were reviewed. Pt confirmed information with teach back. Pt denies SI/HI/AVH and contracts to seek support if thoughts as such occur. Patient was offered support and encouragement. All belongings returned to patient. Pts questions were answered and was escorted safely to transportation.   

## 2017-08-30 NOTE — Progress Notes (Signed)
[place] [facility] Notes:  (Nursing/MHT/Case Management/Adjunct)  Date:  08/29/2017 Time:  2045  Type of Therapy:  wrap up group  Participation Level:  Active  Participation Quality:  Appropriate, Attentive, Sharing and Supportive  Affect:  Depressed and Flat  Cognitive:  Appropriate  Insight:  Improving  Engagement in Group:  Engaged  Modes of Intervention:  Clarification, Education and Support  Summary of Progress/Problems:   Shellia Cleverly 08/30/2017, 3:10 AM

## 2017-09-08 NOTE — Congregational Nurse Program (Signed)
Congregational Nurse Program Note  Date of Encounter: 08/12/2017  Past Medical History: Past Medical History:  Diagnosis Date  . Alcoholism ([place])   . CAD (coronary artery disease) 2002   MI, no intervention required  . COPD (chronic obstructive pulmonary disease) (HCC)    not on home O2  . Depression   . GERD (gastroesophageal reflux disease)   . Headache   . Hypertension   . Stroke [facility]) 2006    Encounter Details: CNP Questionnaire - 08/12/17 0930      Questionnaire   Patient Status  Not Applicable    Race  White or Caucasian    Location Patient Served At  [facility]    Uninsured  Not Applicable    Food  Yes, have food insecurities;Within past 12 months, worried food would run out with no money to buy more    Housing/Utilities  No permanent housing    Transportation  Yes, need transportation assistance    Interpersonal Safety  No, do not feel physically and emotionally safe where you currently live    Medication  Yes, have medication insecurities    Medical Provider  Yes    Referrals  Primary Care Provider/Clinic    ED Visit Averted  Not Applicable    Life-Saving Intervention Made  Not Applicable      BP check. BBS- clear with good air movement. Client states he last went to [facility] for rehab and was sober for 9 months. Client states that he feels like having a payee for his disability would be helpful. Cn encouraged client to speak with the Kathlen Mody CM to discuss payee and housing options.

## 2017-09-09 ENCOUNTER — Telehealth: Payer: Self-pay

## 2017-09-09 NOTE — Telephone Encounter (Signed)
Attempted to contact [facility] # [phone]  to discuss plans for patient's housing after he completes his treatment program . Voicemail message left for June Arnold requesting a call back to # [phone].

## 2017-09-09 NOTE — Telephone Encounter (Signed)
Message received from June Arnold, [facility]. Call returned to her # [phone] and a HIPAA compliant voicemail message was left requesting a call back to # [phone].

## 2017-09-12 NOTE — Congregational Nurse Program (Signed)
Congregational Nurse Program Note  Date of Encounter: 08/19/2017  Past Medical History: Past Medical History:  Diagnosis Date  . Alcoholism ([place])   . CAD (coronary artery disease) 2002   MI, no intervention required  . COPD (chronic obstructive pulmonary disease) (HCC)    not on home O2  . Depression   . GERD (gastroesophageal reflux disease)   . Headache   . Hypertension   . Stroke [facility]) 2006    Encounter Details: CNP Questionnaire - 08/19/17 0900      Questionnaire   Patient Status  Not Applicable    Race  White or Caucasian    Location Patient [place]  Medicaid    Uninsured  Not Applicable    Food  Within past 12 months, worried food would run out with no money to buy more;Yes, have food insecurities    Housing/Utilities  No permanent housing    Transportation  Yes, need transportation assistance    Interpersonal Safety  No, do not feel physically and emotionally safe where you currently live    Medication  Yes, have medication insecurities    Medical Provider  Yes    Referrals  Primary Care Provider/Clinic    ED Visit Averted  Not Applicable    Life-Saving Intervention Made  Not Applicable     Client affect is upbeat today. He states that he feels hopeful that he can remain sober. He is concerned that he is coming down with a cold. BBS- good air movement noted. Expiratory wheeze noted. Client will continue to follow up in [facility] clinic and states that he wants to follow up at [facility] for medication.

## 2017-09-13 ENCOUNTER — Telehealth: Payer: Self-pay | Admitting: Family Medicine

## 2017-09-13 NOTE — Telephone Encounter (Signed)
Call placed to Samuel Reyes at [facility] [phone] regarding patient. Spoke with Samuel and asked her if she would be helping patient by providing information and/or assistance following his discharge. Samuel stated that she couldn't confirm or deny any information regarding patient but she stated that they do have after care coordinators that assist patients with such inform. She stated that she would transfer me to transferred me to a coordinator and they can give me more information. I was transferred to ext. [phone] but no one picked up. Left message asking for a return call at [phone]. They can speak with Opal Sidles as well.

## 2017-09-14 NOTE — Congregational Nurse Program (Signed)
Congregational Nurse Program Note  Date of Encounter: 08/23/2017  Past Medical History: Past Medical History:  Diagnosis Date  . Alcoholism ([place])   . CAD (coronary artery disease) 2002   MI, no intervention required  . COPD (chronic obstructive pulmonary disease) (HCC)    not on home O2  . Depression   . GERD (gastroesophageal reflux disease)   . Headache   . Hypertension   . Stroke [facility]) 2006    Encounter Details: CNP Questionnaire - 08/23/17 0930      Questionnaire   Patient Status  Not Applicable    Race  White or Caucasian    Location Patient [place]  Medicaid    Uninsured  Not Applicable    Food  Within past 12 months, worried food would run out with no money to buy more    Housing/Utilities  No permanent housing    Transportation  Yes, need transportation assistance;Provided transportation assistance (bus pass, taxi voucher, etc.)    Interpersonal Safety  No, do not feel physically and emotionally safe where you currently live    Medication  Yes, have medication insecurities;Provided medication assistance    Medical Provider  Yes    Referrals  Area Agency    ED Visit Averted  Not Applicable    Life-Saving Intervention Made  Not Applicable      CN picked up prescriptions for prednisone, librium and permethrin  From ED visit. Client states he feels somewhat better. He feels hopeful and that he could contact [facility] and [facility] next week. Client has appointment with [facility] on Wednesday. [place] will check in with CN on Monday.

## 2017-09-19 ENCOUNTER — Emergency Department (HOSPITAL_BASED_OUTPATIENT_CLINIC_OR_DEPARTMENT_OTHER)
Admission: EM | Admit: 2017-09-19 | Discharge: 2017-09-19 | Disposition: A | Discharge status: Home / Self Care | Payer: Medicaid Other | Attending: Emergency Medicine | Admitting: Emergency Medicine

## 2017-09-19 ENCOUNTER — Other Ambulatory Visit: Payer: Self-pay

## 2017-09-19 ENCOUNTER — Encounter (HOSPITAL_BASED_OUTPATIENT_CLINIC_OR_DEPARTMENT_OTHER): Payer: Self-pay | Admitting: Emergency Medicine

## 2017-09-19 DIAGNOSIS — R21 Rash and other nonspecific skin eruption: Secondary | ICD-10-CM

## 2017-09-19 DIAGNOSIS — F17210 Nicotine dependence, cigarettes, uncomplicated: Secondary | ICD-10-CM | POA: Diagnosis not present

## 2017-09-19 DIAGNOSIS — I2510 Atherosclerotic heart disease of native coronary artery without angina pectoris: Secondary | ICD-10-CM | POA: Insufficient documentation

## 2017-09-19 DIAGNOSIS — J449 Chronic obstructive pulmonary disease, unspecified: Secondary | ICD-10-CM | POA: Insufficient documentation

## 2017-09-19 DIAGNOSIS — I10 Essential (primary) hypertension: Secondary | ICD-10-CM | POA: Diagnosis not present

## 2017-09-19 DIAGNOSIS — Z79899 Other long term (current) drug therapy: Secondary | ICD-10-CM | POA: Insufficient documentation

## 2017-09-19 MED ORDER — PREDNISONE 50 MG PO TABS
60.0000 mg | ORAL_TABLET | Freq: Once | ORAL | Status: AC
  Administered 2017-09-19: 60 mg via ORAL
  Filled 2017-09-19: qty 1

## 2017-09-19 MED ORDER — HYDROXYZINE HCL 25 MG PO TABS
25.0000 mg | ORAL_TABLET | Freq: Once | ORAL | Status: AC
  Administered 2017-09-19: 25 mg via ORAL
  Filled 2017-09-19: qty 1

## 2017-09-19 MED ORDER — PREDNISONE 20 MG PO TABS: ORAL_TABLET | ORAL | 0 refills | Status: DC

## 2017-09-19 NOTE — ED Triage Notes (Signed)
Patient states that for the last 2 weeks he has had a rash  To his neck and back - patient states that now his legs are getting swollen with the itching

## 2017-09-19 NOTE — Discharge Instructions (Signed)
Please read and follow all provided instructions.  Your diagnoses today include:  1. Rash and nonspecific skin eruption    Tests performed today include:  Vital signs. See below for your results today.   Medications prescribed:   Prednisone - steroid medicine   It is best to take this medication in the morning to prevent sleeping problems. If you are diabetic, monitor your blood sugar closely and stop taking Prednisone if blood sugar is over 300. Take with food to prevent stomach upset.   Take any prescribed medications only as directed.  Home care instructions:  Follow any educational materials contained in this packet.  BE VERY CAREFUL not to take multiple medicines containing Tylenol (also called acetaminophen). Doing so can lead to an overdose which can damage your liver and cause liver failure and possibly death.   Follow-up instructions: Please follow-up with your primary care provider in the next 7 days for further evaluation of your symptoms if not improving.   Return instructions:   Please return to the Emergency Department if you experience worsening symptoms.   Return with fever, difficulty breathing, chest pain.  Please return if you have any other emergent concerns.  Additional Information:  Your vital signs today were: BP 137/79 (BP Location: Left Arm)    Pulse 81    Temp 98.6 F (37 C) (Oral)    Resp 18    Ht 5\' 4"  (1.626 m)    Wt 63.5 kg (140 lb)    SpO2 100%    BMI 24.03 kg/m  If your blood pressure (BP) was elevated above 135/85 this visit, please have this repeated by your doctor within one month. --------------

## 2017-09-19 NOTE — ED Provider Notes (Signed)
[facility] DE[facility]MENT Provider Note   CSN: [id number] Arrival date & time: 09/19/17  1847     History   Chief Complaint Chief Complaint  Patient presents with  . Rash    HPI Samuel Reyes is a 62 y.o. male.  Patient currently at [facility] for treatment of alcohol abuse presents with a constant, intensely itchy rash over his neck, upper back, and chest.  This is been ongoing for a couple of weeks.  Patient has been treated with permethrin for possible scabies.  From his reports, it sounds like he has been treated with this every day.  He denies any fevers, lip swelling, tongue swelling, difficulty breathing.  No vomiting or diarrhea.  He has been taking medication for itching.  This helps temporarily.  He denies other new skin exposures other than the permethrin cream.       Past Medical History:  Diagnosis Date  . Alcoholism ([place])   . CAD (coronary artery disease) 2002   MI, no intervention required  . COPD (chronic obstructive pulmonary disease) (HCC)    not on home O2  . Depression   . GERD (gastroesophageal reflux disease)   . Headache   . Hypertension   . Stroke [facility]) 2006    Patient Active Problem List   Diagnosis Date Noted  . MDD (major depressive disorder), recurrent severe, without psychosis ([place]) 08/26/2017  . Alcohol withdrawal ([place]) 08/08/2017  . COPD with acute bronchitis ([place]) 07/29/2017  . Alcohol use   . Homelessness 06/24/2017  . Lung nodule 09/03/2016  . Tobacco abuse 09/03/2016  . Insomnia 09/03/2016  . HCAP (healthcare-associated pneumonia) 08/22/2016  . Bronchiolitis 08/21/2016  . Malnutrition of moderate degree 08/04/2016  . Atrial tachycardia ([place]) 08/02/2016  . Impaired glucose tolerance 08/02/2016  . Alcohol abuse with alcohol-induced mood disorder ([place]) 12/12/2015  . Syncope 11/15/2015  . Chest pain 11/15/2015  . Nausea vomiting and diarrhea 11/15/2015  . Abdominal pain 11/15/2015  . Major depressive disorder,  recurrent episode, moderate with anxious distress ([place]) 10/30/2015  . Alcohol use disorder, severe, dependence ([place]) 10/29/2015  . COPD exacerbation ([place]) 09/20/2015  . Acute respiratory failure with hypoxia ([place]) 09/18/2015  . Suicidal ideation 09/17/2015  . Alcohol intoxication ([place]) 09/17/2015  . Depression 09/17/2015  . Benign essential HTN 09/17/2015  . Hypokalemia 09/17/2015  . Hyponatremia 09/17/2015  . Coffee ground emesis 09/17/2015  . COPD (chronic obstructive pulmonary disease) ([place]) 02/17/2013  . GERD (gastroesophageal reflux disease) 02/17/2013    Past Surgical History:  Procedure Laterality Date  . BACK SURGERY     3 cervical spine surgeries C4-C5 fused  . COLONOSCOPY N/A 01/04/2014   Procedure: COLONOSCOPY;  Surgeon: Danie Binder, MD;  [facility];  Service: Endoscopy;  Laterality: N/A;  1:45  . ESOPHAGOGASTRODUODENOSCOPY N/A 01/04/2014   Procedure: ESOPHAGOGASTRODUODENOSCOPY (EGD);  Surgeon: Danie Binder, MD;  Location: [facility];  Service: Endoscopy;  Laterality: N/A;  . FINGER SURGERY Left    2nd, 3rd, & 4th fingers were cut off by table saw and reattached  . GASTRECTOMY    . HERNIA REPAIR    . INCISIONAL HERNIA REPAIR N/A 01/20/2014   Procedure: LAPAROSCOPIC RECURRENT  INCISIONAL HERNIA with mesh;  Surgeon: Edward Jolly, MD;  Location: [facility] ORS;  Service: General;  Laterality: N/A;  . rt knee arthroscopic surgery    . SHOULDER SURGERY Bilateral    3 surgeries on on left, 2 surgeries on right  Home Medications    Prior to Admission medications   Medication Sig Start Date End Date Taking? Authorizing Provider  albuterol (PROVENTIL HFA;VENTOLIN HFA) 108 (90 Base) MCG/ACT inhaler Inhale 1-2 puffs into the lungs every 4 (four) hours as needed for wheezing. 08/29/17   Money, Lowry Ram, FNP  buPROPion (WELLBUTRIN XL) 300 MG 24 hr tablet Take 1 tablet (300 mg total) by mouth daily. For mood control 08/30/17   Money, Lowry Ram, FNP  gabapentin  (NEURONTIN) 100 MG capsule Take 1 capsule (100 mg total) by mouth 3 (three) times daily. 08/29/17   Money, Lowry Ram, FNP  hydrOXYzine (ATARAX/VISTARIL) 25 MG tablet Take 1 tablet (25 mg total) by mouth every 6 (six) hours as needed for itching. 08/29/17   Money, Lowry Ram, FNP  predniSONE (DELTASONE) 20 MG tablet 3 Tabs PO Days 1-3, then 2 tabs PO Days 4-6, then 1 tab PO Day 7-9, then Half Tab PO Day 10-12 09/19/17   Carlisle Cater, PA-C  tiotropium (SPIRIVA HANDIHALER) 18 MCG inhalation capsule Place 1 capsule (18 mcg total) into inhaler and inhale daily. 08/30/17   Money, Lowry Ram, FNP  traZODone (DESYREL) 50 MG tablet Take 1 tablet (50 mg total) by mouth at bedtime as needed for sleep. 08/29/17   Money, Lowry Ram, FNP    Family History Family History  Problem Relation Age of Onset  . Cancer Father        bone  . Cancer Brother        lungs  . Stroke Maternal Grandmother   . Asthma Son        died at age 41 in his sleep   . Spina bifida Son        died at age 42   . Dementia Mother   . Colon cancer Neg Hx     Social History Social History   Tobacco Use  . Smoking status: Current Every Day Smoker    Packs/day: 2.00    Years: 52.00    Pack years: 104.00    Types: Cigarettes    Start date: 07/30/1966  . Smokeless tobacco: Never Used  . Tobacco comment: down to 1/4 ppd  Substance Use Topics  . Alcohol use: Yes    Alcohol/week: 1.8 - 2.4 oz    Types: 3 - 4 Cans of beer per week    Comment: everyday- 3-4 40 oz  . Drug use: No    Comment: denied using any drugs     Allergies   Bee venom and Penicillins   Review of Systems Review of Systems  Constitutional: Negative for fever.  HENT: Negative for facial swelling and trouble swallowing.   Eyes: Negative for redness.  Respiratory: Negative for shortness of breath, wheezing and stridor.   Cardiovascular: Negative for chest pain.  Gastrointestinal: Negative for nausea and vomiting.  Musculoskeletal: Negative for myalgias.  Skin:  Positive for rash.  Neurological: Negative for light-headedness.  Psychiatric/Behavioral: Negative for confusion.     Physical Exam Updated Vital Signs BP 137/79 (BP Location: Left Arm)   Pulse 81   Temp 98.6 F (37 C) (Oral)   Resp 18   Ht 5\' 4"  (1.626 m)   Wt 63.5 kg (140 lb)   SpO2 100%   BMI 24.03 kg/m   Physical Exam  Constitutional: He appears well-developed and well-nourished.  HENT:  Head: Normocephalic and atraumatic.  Mouth/Throat: Oropharynx is clear and moist.  No angioedema  Eyes: Conjunctivae are normal. Right eye exhibits no discharge. Left  eye exhibits no discharge.  Neck: Normal range of motion. Neck supple.  Cardiovascular: Normal rate, regular rhythm and normal heart sounds.  Pulmonary/Chest: Effort normal and breath sounds normal. No stridor. No respiratory distress. He has no wheezes.  Abdominal: Soft. There is no tenderness.  Neurological: He is alert.  Skin: Skin is warm and dry.  Patient with an erythematous skin eruption noted to the posterior neck, upper back, upper chest.  Patient does not have any lesions around the waist or in the skin folds of the hands or feet.  Psychiatric: He has a normal mood and affect.  Nursing note and vitals reviewed.    ED Treatments / Results   Procedures Procedures (including critical care time)  Medications Ordered in ED Medications  hydrOXYzine (ATARAX/VISTARIL) tablet 25 mg (25 mg Oral Given 09/19/17 2253)  predniSONE (DELTASONE) tablet 60 mg (60 mg Oral Given 09/19/17 2253)     Initial Impression / Assessment and Plan / ED Course  I have reviewed the triage vital signs and the nursing notes.  Pertinent labs & imaging results that were available during my care of the patient were reviewed by me and considered in my medical decision making (see chart for details).     Patient seen and examined.   Vital signs reviewed and are as follows: BP 137/79 (BP Location: Left Arm)   Pulse 81   Temp 98.6 F  (37 C) (Oral)   Resp 18   Ht 5\' 4"  (1.626 m)   Wt 63.5 kg (140 lb)   SpO2 100%   BMI 24.03 kg/m   Rash does not appear to be scabies.  Will have patient discontinue permethrin.  He will continue oral medications for itching.  Will start on prednisone taper.  Encouraged to recheck if not improving in 1 week.  Final Clinical Impressions(s) / ED Diagnoses   Final diagnoses:  Rash and nonspecific skin eruption   Patient with nonspecific rash and skin eruption, no signs of anaphylaxis.  No signs of infection.  Only new topical medication is permethrin which he has been given every day.  Will treat with continued antihistamines and prednisone taper.  ED Discharge Orders        Ordered    predniSONE (DELTASONE) 20 MG tablet     09/19/17 2310       Carlisle Cater, PA-C 09/19/17 2320    Malvin [place], MD 09/19/17 2337

## 2017-09-24 ENCOUNTER — Telehealth: Payer: Self-pay | Admitting: Family Medicine

## 2017-09-24 NOTE — Telephone Encounter (Signed)
Call placed to [facility] [phone] regarding patient's status and plans following his discharge. Spoke with Aniceto Boss, after care coordinator, and she informed me that patient's discharge date is March 2. Aniceto Boss also informed me that she had a conversation with patient about the plans following his discharge and patient is not interested in going to a recovery place/[facility] and is refusing any services. Patient just wants his own place, but Aniceto Boss stated that patient is not ready to live on his own. Unfortunately, they cannot help patient with that (finding his own place). The next step is to find patient a shelter to stay at.   Aniceto Boss stated that she will talk to patient one more time about discharge plans before this Saturday. She also confirmed that we can call next week to find out what patient decided to do.

## 2017-09-30 ENCOUNTER — Telehealth: Payer: Self-pay

## 2017-09-30 NOTE — Telephone Encounter (Signed)
Attempted to contact [facility] Aftercare Coordinator # [phone] to inquire if the patient was discharged from their facility on 09/28/17 as planned and also to determine his where he was discharged to.  As per Lisette Abu, [place], he is not at that their shelter.  Voicemail message left requesting a call back to # [phone].

## 2017-10-03 ENCOUNTER — Telehealth: Payer: Self-pay | Admitting: Family Medicine

## 2017-10-03 NOTE — Telephone Encounter (Signed)
Call placed to [facility] Aftercare Coordinator # [phone] to ask if the patient was discharged from their facility and to also ask more information as to where patient was discharged to (shelter ?). No answer. Left Aniceto Boss a message asking her to return my call at [phone].

## 2017-10-04 NOTE — Telephone Encounter (Signed)
As per Isac Sarna returned my call yesterday. I was out of the office.  Call placed to [facility] [phone], today to get and update on patient's discharge (date) and ask where he was discharged to. Left Aniceto Boss a message asking her to return my call at [phone].

## 2017-10-08 ENCOUNTER — Telehealth: Payer: Self-pay | Admitting: Family Medicine

## 2017-10-08 NOTE — Telephone Encounter (Signed)
Call placed to [facility], after care coordinator from [facility], regarding patient's discharge and follow up. No answer. Left Aniceto Boss a message asking her to return my call at [phone]. Aniceto Boss can also speak with Opal Sidles.

## 2017-10-10 ENCOUNTER — Telehealth: Payer: Self-pay | Admitting: Family Medicine

## 2017-10-10 NOTE — Telephone Encounter (Signed)
Call placed to Vilma Meckel, after care coordinator, from [facility] [phone], to ask about patient's discharge date and ask where he was discharged to. No answer. Left Doug a message asking him to return my call at [phone]. He can also speak with Opal Sidles or [place].

## 2018-04-04 ENCOUNTER — Emergency Department (HOSPITAL_COMMUNITY)
Admission: EM | Admit: 2018-04-04 | Discharge: 2018-04-04 | Disposition: A | Discharge status: Home / Self Care | Payer: Medicaid Other | Attending: Emergency Medicine | Admitting: Emergency Medicine

## 2018-04-04 ENCOUNTER — Emergency Department (HOSPITAL_COMMUNITY): Payer: Medicaid Other

## 2018-04-04 ENCOUNTER — Encounter (HOSPITAL_COMMUNITY): Payer: Self-pay

## 2018-04-04 DIAGNOSIS — F10920 Alcohol use, unspecified with intoxication, uncomplicated: Secondary | ICD-10-CM | POA: Insufficient documentation

## 2018-04-04 DIAGNOSIS — R079 Chest pain, unspecified: Secondary | ICD-10-CM | POA: Insufficient documentation

## 2018-04-04 DIAGNOSIS — E162 Hypoglycemia, unspecified: Secondary | ICD-10-CM | POA: Diagnosis not present

## 2018-04-04 DIAGNOSIS — R042 Hemoptysis: Secondary | ICD-10-CM | POA: Diagnosis not present

## 2018-04-04 DIAGNOSIS — I2510 Atherosclerotic heart disease of native coronary artery without angina pectoris: Secondary | ICD-10-CM | POA: Insufficient documentation

## 2018-04-04 DIAGNOSIS — Y908 Blood alcohol level of 240 mg/100 ml or more: Secondary | ICD-10-CM | POA: Insufficient documentation

## 2018-04-04 DIAGNOSIS — Z79899 Other long term (current) drug therapy: Secondary | ICD-10-CM | POA: Insufficient documentation

## 2018-04-04 DIAGNOSIS — R55 Syncope and collapse: Secondary | ICD-10-CM | POA: Diagnosis present

## 2018-04-04 DIAGNOSIS — F17210 Nicotine dependence, cigarettes, uncomplicated: Secondary | ICD-10-CM | POA: Insufficient documentation

## 2018-04-04 DIAGNOSIS — K2920 Alcoholic gastritis without bleeding: Secondary | ICD-10-CM | POA: Insufficient documentation

## 2018-04-04 DIAGNOSIS — J449 Chronic obstructive pulmonary disease, unspecified: Secondary | ICD-10-CM | POA: Diagnosis not present

## 2018-04-04 DIAGNOSIS — I10 Essential (primary) hypertension: Secondary | ICD-10-CM | POA: Diagnosis not present

## 2018-04-04 LAB — CBC WITH DIFFERENTIAL/PLATELET
BASOS PCT: 0 %
Basophils Absolute: 0 10*3/uL (ref 0.0–0.1)
EOS ABS: 0 10*3/uL (ref 0.0–0.7)
Eosinophils Relative: 0 %
HCT: 42 % (ref 39.0–52.0)
HEMOGLOBIN: 13.7 g/dL (ref 13.0–17.0)
Lymphocytes Relative: 11 %
Lymphs Abs: 0.7 10*3/uL (ref 0.7–4.0)
MCH: 24.2 pg — ABNORMAL LOW (ref 26.0–34.0)
MCHC: 32.6 g/dL (ref 30.0–36.0)
MCV: 74.3 fL — ABNORMAL LOW (ref 78.0–100.0)
MONO ABS: 1.1 10*3/uL — AB (ref 0.1–1.0)
MONOS PCT: 16 %
NEUTROS PCT: 73 %
Neutro Abs: 5 10*3/uL (ref 1.7–7.7)
Platelets: 326 10*3/uL (ref 150–400)
RBC: 5.65 MIL/uL (ref 4.22–5.81)
RDW: 20.1 % — ABNORMAL HIGH (ref 11.5–15.5)
WBC: 6.7 10*3/uL (ref 4.0–10.5)

## 2018-04-04 LAB — COMPREHENSIVE METABOLIC PANEL
ALBUMIN: 4 g/dL (ref 3.5–5.0)
ALT: 18 U/L (ref 0–44)
AST: 35 U/L (ref 15–41)
Alkaline Phosphatase: 68 U/L (ref 38–126)
Anion gap: 13 (ref 5–15)
BILIRUBIN TOTAL: 0.6 mg/dL (ref 0.3–1.2)
BUN: 10 mg/dL (ref 8–23)
CALCIUM: 9.1 mg/dL (ref 8.9–10.3)
CO2: 24 mmol/L (ref 22–32)
CREATININE: 0.89 mg/dL (ref 0.61–1.24)
Chloride: 107 mmol/L (ref 98–111)
GFR calc Af Amer: >60 mL/min (ref >60)
GLUCOSE: 124 mg/dL — AB (ref 70–99)
Potassium: 4.4 mmol/L (ref 3.5–5.1)
Sodium: 144 mmol/L (ref 135–145)
TOTAL PROTEIN: 7.6 g/dL (ref 6.5–8.1)

## 2018-04-04 LAB — RAPID URINE DRUG SCREEN, HOSP PERFORMED
AMPHETAMINES: NOT DETECTED
BARBITURATES: NOT DETECTED
BENZODIAZEPINES: NOT DETECTED
Cocaine: NOT DETECTED
Opiates: NOT DETECTED
Tetrahydrocannabinol: NOT DETECTED

## 2018-04-04 LAB — URINALYSIS, ROUTINE W REFLEX MICROSCOPIC
Bilirubin Urine: NEGATIVE
GLUCOSE, UA: 150 mg/dL — AB
Hgb urine dipstick: NEGATIVE
KETONES UR: NEGATIVE mg/dL
LEUKOCYTES UA: NEGATIVE
Nitrite: NEGATIVE
PH: 5 (ref 5.0–8.0)
Protein, ur: NEGATIVE mg/dL
Specific Gravity, Urine: >1.046 — ABNORMAL HIGH (ref 1.005–1.030)

## 2018-04-04 LAB — ETHANOL: ALCOHOL ETHYL (B): 262 mg/dL — AB (ref <10)

## 2018-04-04 LAB — CBG MONITORING, ED: Glucose-Capillary: 205 mg/dL — ABNORMAL HIGH (ref 70–99)

## 2018-04-04 LAB — I-STAT TROPONIN, ED: TROPONIN I, POC: 0.03 ng/mL (ref 0.00–0.08)

## 2018-04-04 LAB — LIPASE, BLOOD: Lipase: 31 U/L (ref 11–51)

## 2018-04-04 MED ORDER — PANTOPRAZOLE SODIUM 40 MG PO TBEC: 40.0000 mg | DELAYED_RELEASE_TABLET | Freq: Two times a day (BID) | ORAL | 0 refills | Status: DC

## 2018-04-04 MED ORDER — SODIUM CHLORIDE 0.9 % IV BOLUS
1000.0000 mL | Freq: Once | INTRAVENOUS | Status: AC
  Administered 2018-04-04: 1000 mL via INTRAVENOUS

## 2018-04-04 MED ORDER — FAMOTIDINE 20 MG PO TABS: 20.0000 mg | ORAL_TABLET | Freq: Two times a day (BID) | ORAL | 0 refills | Status: DC

## 2018-04-04 MED ORDER — CHLORDIAZEPOXIDE HCL 25 MG PO CAPS
50.0000 mg | ORAL_CAPSULE | Freq: Once | ORAL | Status: AC
  Administered 2018-04-04: 50 mg via ORAL
  Filled 2018-04-04: qty 2

## 2018-04-04 MED ORDER — CHLORDIAZEPOXIDE HCL 25 MG PO CAPS: ORAL_CAPSULE | ORAL | 0 refills | Status: DC

## 2018-04-04 MED ORDER — IOPAMIDOL (ISOVUE-370) INJECTION 76%
INTRAVENOUS | Status: AC
  Filled 2018-04-04: qty 100

## 2018-04-04 MED ORDER — IOPAMIDOL (ISOVUE-370) INJECTION 76%
100.0000 mL | Freq: Once | INTRAVENOUS | Status: AC | PRN
  Administered 2018-04-04: 100 mL via INTRAVENOUS

## 2018-04-04 NOTE — Discharge Instructions (Signed)
If you develop recurrent or worsening chest pain, abdominal pain, vomiting or if you develop vomiting blood, fever, or any other new/concerning symptoms and return to the ER for evaluation.  Otherwise follow-up with your primary care physician.  Use the resources provided to help find a detoxification center.  Substance Abuse Treatment Programs  Intensive Outpatient Programs [facility]     [address], [place]       [facility] [place] [address] [place], [place]  [facility]     (Inpatient and outpatient)     [address]           [place] [phone] (Suboxone and Methadone)  [place], [place] [address]      [place] [address] [place], [place]  [facility] [place] (Outpatient/Inpatient, Chemical)    (insurance only) [phone]             [facility] ([place]) [place], [place]     [facility]     [address]     [place], [place]       [facility] (for caregivers and family) [phone] [address] [place]een [place]. [place], [place]      Residential Treatment Programs [facility]      [address] [place], [place] [address]  [phone]       [facility] [address], [place], [place] [address] [phone]  [facility]        [phone]       [facility] [place] [phone]  [facility] ([place]             [place], [place] or [place], [address] [phone]  [facility]    [address]      [place],  [place]       [facility] [address] [place], [place]  [place]   [address] [place], [place] [address]     [phone]      Admissions: 8am-3pm M-F  [facility] (RTS) [address] [place], [place]  [facility] Program: Residential Program [phone] Days)   [place], [place] or [phone]     [facility]C: [facility] [place], [place] (Walk in Hours over  the weekend or by referral)  [facility] [place], [place], [place] [address] [phone]  Crisis Mobile: [facility]:  [phone] (for crisis response 24 hours a day) [facility] Hotline:      660-328-6016 Outpatient Psychiatry and Counseling  [facility]: Mobile Crisis Management 24 hours:  [phone]  [facility] of the [employer] sliding scale fee and walk in schedule: M-F 8am-12pm/1pm-3pm [place], [place] [address] [address] [place], [place] [address] [phone]  [facility] (Formerly known as [facility] [employer])- new patient walk-in appointments available Monday - Friday 8am -3pm.          [address] [place], [place] [address] [phone] or crisis line- [facility]/ Intensive Outpatient Therapy Program [place], [place] [address] [place]      [phone] [address], [place] [address]                 [place]   [facility] [phone]. [place], [place] [address]   [employer] of Care          [address] Johnette Abraham  [place], [place] [address]       [phone]  [facility] [address], [place], [place] [address] [phone]  [facility]    [address] [place], [place] [address]     [place], [place] Joycelyn Man     [place] [address]     [phone]       [facility] [place] [address]  [facility]     [address], [place], [place], [place] [address] [place]     [address]     [place], [place] [address]     [phone]       [facility] [address] [place], [place] [address] [phone] Resources for Temporary Residential Assistance/Crisis [place] [facility]) M-F 8am-3pm   [address], [place] [address]   [phone] Services include: laundry, barbering, support groups, case management, phone  & computer access, showers, AA/NA mtgs, mental health/substance abuse nurse, job skills class, disability information, VA assistance, spiritual classes, etc.   [facility]   [address], [place]     [place]              [employer] (women and children)       [place]. [place], [place] [address] [phone] [email or web address] for application and process Application Required  [facility]   [address]    [place]  [address]     [phone]  [place], [place] [address] [phone](RXYVOPFY application appt.) Application Required  [facility] (women only)    [address]     [place], [place] [address]     [phone]      Intake starts 6pm daily Need valid ID, SSC, & Police report [employer] [address] [place], [place] [phone] Application Required  [employer] (men only)     [place].      [place], [place]       [place] (Pregnant women only) [address] [place], [place]  [facility] [place] [place]      [facility] Dani [address].      [place], [place] [address]     [phone]             [facility] [address] [place], [place] 90 day commitment/SA/Application process  [facility](men only)     [address]     [place], [place]       Check-in at [facility] of [facility] [address] [place], [place] [address] [phone] Men/Women/Women and Children must be there by 7 pm  [place], [place]

## 2018-04-04 NOTE — ED Notes (Signed)
Bed: [facility] Expected date:  Expected time:  Means of arrival:  Comments: EMS ETOH/drugs/140HR

## 2018-04-04 NOTE — ED Notes (Signed)
Attempted to get wheelchair for pt or assist with walking, but pt kept stating "I got it. I got it"

## 2018-04-04 NOTE — ED Triage Notes (Addendum)
Patient arrived via [facility] from the street. Patient laying behind gas station passed out on corner one and submit. Admits to having Etoh and meth on board. Bi-standers called. Patient found sweating profusely.  CBG-50- D-10 given per ems.  20g in right AC.  Bp-113/71 HR-158 RR-14 96% RA CBG now-206

## 2018-04-04 NOTE — ED Provider Notes (Signed)
[facility] DEPT Provider Note   CSN: [id number] Arrival date & time: 04/04/18  1522     History   Chief Complaint Chief Complaint  Patient presents with  . Near Syncope  . Hypoglycemia    HPI Samuel Reyes is a 62 y.o. male.  HPI  62 year old male with a history of COPD, chronic alcoholism, CAD and prior stroke presents with a chief complaint of drinking too much.  EMS was apparently called due to the patient being passed out at a gas station.  Was found to be diaphoretic and had a CBG of 50.  He was given D10 by EMS.  Patient admitted to doing alcohol and meth according to be EMS although he states he is only been drinking alcohol to me.  Denies any illicit drug use.  Drinks about 4 or 5 40 ounce beers per day.  He has drank this much already today.  He complains of chest pain that has been constant for about 2 or more weeks.  Has chronic shortness of breath from COPD but no new shortness of breath.  However he has had a new cough that he states has some blood in it for about a week or so.  Has had some vomiting as well as well as upper abdominal pain.  Nothing seems to make the pain in his chest or abdomen worse.  Denies any leg swelling.  Past Medical History:  Diagnosis Date  . Alcoholism ([place])   . CAD (coronary artery disease) 2002   MI, no intervention required  . COPD (chronic obstructive pulmonary disease) (HCC)    not on home O2  . Depression   . GERD (gastroesophageal reflux disease)   . Headache   . Hypertension   . Stroke [facility]) 2006    Patient Active Problem List   Diagnosis Date Noted  . MDD (major depressive disorder), recurrent severe, without psychosis ([place]) 08/26/2017  . Alcohol withdrawal ([place]) 08/08/2017  . COPD with acute bronchitis ([place]) 07/29/2017  . Alcohol use   . Homelessness 06/24/2017  . Lung nodule 09/03/2016  . Tobacco abuse 09/03/2016  . Insomnia 09/03/2016  . HCAP (healthcare-associated pneumonia) 08/22/2016    . Bronchiolitis 08/21/2016  . Malnutrition of moderate degree 08/04/2016  . Atrial tachycardia ([place]) 08/02/2016  . Impaired glucose tolerance 08/02/2016  . Alcohol abuse with alcohol-induced mood disorder ([place]) 12/12/2015  . Syncope 11/15/2015  . Chest pain 11/15/2015  . Nausea vomiting and diarrhea 11/15/2015  . Abdominal pain 11/15/2015  . Major depressive disorder, recurrent episode, moderate with anxious distress ([place]) 10/30/2015  . Alcohol use disorder, severe, dependence ([place]) 10/29/2015  . COPD exacerbation ([place]) 09/20/2015  . Acute respiratory failure with hypoxia ([place]) 09/18/2015  . Suicidal ideation 09/17/2015  . Alcohol intoxication ([place]) 09/17/2015  . Depression 09/17/2015  . Benign essential HTN 09/17/2015  . Hypokalemia 09/17/2015  . Hyponatremia 09/17/2015  . Coffee ground emesis 09/17/2015  . COPD (chronic obstructive pulmonary disease) ([place]) 02/17/2013  . GERD (gastroesophageal reflux disease) 02/17/2013    Past Surgical History:  Procedure Laterality Date  . BACK SURGERY     3 cervical spine surgeries C4-C5 fused  . COLONOSCOPY N/A 01/04/2014   Procedure: COLONOSCOPY;  Surgeon: Danie Binder, MD;  Location: [facility];  Service: Endoscopy;  Laterality: N/A;  1:45  . ESOPHAGOGASTRODUODENOSCOPY N/A 01/04/2014   Procedure: ESOPHAGOGASTRODUODENOSCOPY (EGD);  Surgeon: Danie Binder, MD;  Location: [facility];  Service: Endoscopy;  Laterality: N/A;  . FINGER SURGERY  Left    2nd, 3rd, & 4th fingers were cut off by table saw and reattached  . GASTRECTOMY    . HERNIA REPAIR    . INCISIONAL HERNIA REPAIR N/A 01/20/2014   Procedure: LAPAROSCOPIC RECURRENT  INCISIONAL HERNIA with mesh;  Surgeon: Edward Jolly, MD;  Location: [facility] ORS;  Service: General;  Laterality: N/A;  . rt knee arthroscopic surgery    . SHOULDER SURGERY Bilateral    3 surgeries on on left, 2 surgeries on right         Home Medications    Prior to Admission medications   Medication  Sig Start Date End Date Taking? Authorizing Provider  albuterol (PROVENTIL HFA;VENTOLIN HFA) 108 (90 Base) MCG/ACT inhaler Inhale 1-2 puffs into the lungs every 4 (four) hours as needed for wheezing. 08/29/17   Money, Lowry Ram, FNP  buPROPion (WELLBUTRIN XL) 300 MG 24 hr tablet Take 1 tablet (300 mg total) by mouth daily. For mood control 08/30/17   Money, Lowry Ram, FNP  gabapentin (NEURONTIN) 100 MG capsule Take 1 capsule (100 mg total) by mouth 3 (three) times daily. 08/29/17   Money, Lowry Ram, FNP  hydrOXYzine (ATARAX/VISTARIL) 25 MG tablet Take 1 tablet (25 mg total) by mouth every 6 (six) hours as needed for itching. 08/29/17   Money, Lowry Ram, FNP  predniSONE (DELTASONE) 20 MG tablet 3 Tabs PO Days 1-3, then 2 tabs PO Days 4-6, then 1 tab PO Day 7-9, then Half Tab PO Day 10-12 09/19/17   Carlisle Cater, PA-C  tiotropium (SPIRIVA HANDIHALER) 18 MCG inhalation capsule Place 1 capsule (18 mcg total) into inhaler and inhale daily. 08/30/17   Money, Lowry Ram, FNP  traZODone (DESYREL) 50 MG tablet Take 1 tablet (50 mg total) by mouth at bedtime as needed for sleep. 08/29/17   Money, Lowry Ram, FNP    Family History Family History  Problem Relation Age of Onset  . Cancer Father        bone  . Cancer Brother        lungs  . Stroke Maternal Grandmother   . Asthma Son        died at age 101 in his sleep   . Spina bifida Son        died at age 62   . Dementia Mother   . Colon cancer Neg Hx     Social History Social History   Tobacco Use  . Smoking status: Current Every Day Smoker    Packs/day: 2.00    Years: 52.00    Pack years: 104.00    Types: Cigarettes    Start date: 07/30/1966  . Smokeless tobacco: Never Used  . Tobacco comment: down to 1/4 ppd  Substance Use Topics  . Alcohol use: Yes    Alcohol/week: 3.0 - 4.0 standard drinks    Types: 3 - 4 Cans of beer per week    Comment: everyday- 3-4 40 oz  . Drug use: No    Comment: denied using any drugs     Allergies   Bee venom and  Penicillins   Review of Systems Review of Systems  Respiratory: Positive for cough and shortness of breath.   Cardiovascular: Positive for chest pain. Negative for leg swelling.  Gastrointestinal: Positive for abdominal pain and vomiting.  All other systems reviewed and are negative.    Physical Exam Updated Vital Signs BP (!) 141/96 (BP Location: Left Arm)   Pulse (!) 120   Temp 98.8 F (37.1  C) (Oral)   Resp 16   SpO2 95%   Physical Exam  Constitutional: He is oriented to person, place, and time. He appears well-developed and well-nourished. No distress.  HENT:  Head: Normocephalic and atraumatic.  Right Ear: External ear normal.  Left Ear: External ear normal.  Nose: Nose normal.  Eyes: Right eye exhibits no discharge. Left eye exhibits no discharge.  Neck: Neck supple.  Cardiovascular: Regular rhythm and normal heart sounds. Tachycardia present.  Pulmonary/Chest: Effort normal and breath sounds normal. He has no wheezes.  Abdominal: Soft. There is tenderness in the epigastric area.  Musculoskeletal: He exhibits no edema.  Neurological: He is alert and oriented to person, place, and time.  Skin: Skin is warm and dry.  Nursing note and vitals reviewed.    ED Treatments / Results  Labs (all labs ordered are listed, but only abnormal results are displayed) Labs Reviewed  COMPREHENSIVE METABOLIC PANEL - Abnormal; Notable for the following components:      Result Value   Glucose, Bld 124 (*)    All other components within normal limits  ETHANOL - Abnormal; Notable for the following components:   Alcohol, Ethyl (B) 262 (*)    All other components within normal limits  CBC WITH DIFFERENTIAL/PLATELET - Abnormal; Notable for the following components:   MCV 74.3 (*)    MCH 24.2 (*)    RDW 20.1 (*)    Monocytes Absolute 1.1 (*)    All other components within normal limits  URINALYSIS, ROUTINE W REFLEX MICROSCOPIC - Abnormal; Notable for the following components:    Specific Gravity, Urine >1.046 (*)    Glucose, UA 150 (*)    All other components within normal limits  CBG MONITORING, ED - Abnormal; Notable for the following components:   Glucose-Capillary 205 (*)    All other components within normal limits  LIPASE, BLOOD  RAPID URINE DRUG SCREEN, HOSP PERFORMED  CBG MONITORING, ED  I-STAT TROPONIN, ED    EKG EKG Interpretation  Date/Time:  Friday April 04 2018 18:01:51 EDT Ventricular Rate:  116 PR Interval:  124 QRS Duration: 78 QT Interval:  306 QTC Calculation: 425 R Axis:   81 Text Interpretation:  Sinus tachycardia no acute ST/T changes artifact V1, V2 rate is faster compared to jan 2019 Confirmed by Sherwood Gambler [phone]) on 04/04/2018 6:05:36 PM   Radiology Dg Chest 2 View  Result Date: 04/04/2018 CLINICAL DATA:  Chest pain, cough, and hemoptysis. EXAM: CHEST - 2 VIEW COMPARISON:  Chest x-ray and chest CT dated 08/22/2017 FINDINGS: Heart size and pulmonary vascularity are normal and the lungs are clear. Slight prominence of the superior mediastinum is demonstrated to be tortuous brachiocephalic vessels as visualized on the prior chest CT. No effusions. No significant bone abnormality. Previous resection of the distal left clavicle. Old healed right anterior rib fractures. IMPRESSION: No active cardiopulmonary disease. Electronically Signed   By: Lorriane Shire M.D.   On: 04/04/2018 17:30   Ct Angio Chest Pe W/cm &/or Wo Cm  Result Date: 04/04/2018 CLINICAL DATA:  Found down, sweating profusely history of drug abuse EXAM: CT ANGIOGRAPHY CHEST WITH CONTRAST TECHNIQUE: Multidetector CT imaging of the chest was performed using the standard protocol during bolus administration of intravenous contrast. Multiplanar CT image reconstructions and MIPs were obtained to evaluate the vascular anatomy. CONTRAST:  193mL ISOVUE-370 IOPAMIDOL (ISOVUE-370) INJECTION 76% COMPARISON:  Chest x-ray 04/04/2018, CT chest 08/22/2017 FINDINGS: Cardiovascular:  Satisfactory opacification of the pulmonary arteries to the segmental level. No  evidence of pulmonary embolism. Nonaneurysmal aorta. No dissection. Mild aortic atherosclerosis. Mild coronary vascular calcification. Normal heart size. No pericardial effusion. Mediastinum/Nodes: Midline trachea. No thyroid mass. Mild circumferential thickening of the distal esophagus. No significant adenopathy Lungs/Pleura: Mild emphysema. No acute opacity, pleural effusion or pneumothorax Upper Abdomen: No acute abnormality. Musculoskeletal: No chest wall abnormality. No acute or significant osseous findings. Review of the MIP images confirms the above findings. IMPRESSION: 1. Negative for acute pulmonary embolus or aortic dissection. 2. Mild emphysema. Aortic Atherosclerosis (ICD10-I70.0) and Emphysema (ICD10-J43.9). Electronically Signed   By: Donavan Foil M.D.   On: 04/04/2018 19:17    Procedures Procedures (including critical care time)  Medications Ordered in ED Medications  iopamidol (ISOVUE-370) 76 % injection (has no administration in time range)  sodium chloride 0.9 % bolus 1,000 mL (0 mLs Intravenous Stopped 04/04/18 2126)  sodium chloride 0.9 % bolus 1,000 mL (0 mLs Intravenous Stopped 04/04/18 2126)  iopamidol (ISOVUE-370) 76 % injection 100 mL (100 mLs Intravenous Contrast Given 04/04/18 1851)  chlordiazePOXIDE (LIBRIUM) capsule 50 mg (50 mg Oral Given 04/04/18 2019)     Initial Impression / Assessment and Plan / ED Course  I have reviewed the triage vital signs and the nursing notes.  Pertinent labs & imaging results that were available during my care of the patient were reviewed by me and considered in my medical decision making (see chart for details).     Patient's epigastric pain is probably gastritis.  He is been having this chest pain for 2 weeks straight and states is a constant pain.  Given no obvious ischemia on ECG and negative troponin I do not think further ACS work-up is needed.  Given the  chest pain with the reported hemoptysis however a CT scan was obtained to help rule out PE.  This is negative.  He has not been having coughing or vomiting or hematemesis here.  Probably the symptoms are all related to the alcohol abuse and gastritis.  I will treat this with prescription medicines.  He is interested in cutting back on alcohol and stopping alcohol and is been referred to detoxification centers.  His heart rate has improved and while it is still a little bit tachycardic at around 110, this is an improvement and he appears well and is ambulate in without difficulty.  He is not currently intoxicated and appears stable for discharge home.  Return precautions.  Final Clinical Impressions(s) / ED Diagnoses   Final diagnoses:  Alcoholic intoxication without complication (HCC)  Nonspecific chest pain  Acute alcoholic gastritis, presence of bleeding unspecified    ED Discharge Orders    None       Sherwood Gambler, MD 04/04/18 2222

## 2018-05-09 ENCOUNTER — Emergency Department (HOSPITAL_COMMUNITY): Payer: Medicaid Other

## 2018-05-09 ENCOUNTER — Emergency Department (HOSPITAL_COMMUNITY)
Admission: EM | Admit: 2018-05-09 | Discharge: 2018-05-10 | Disposition: A | Discharge status: Home / Self Care | Payer: Medicaid Other | Attending: Emergency Medicine | Admitting: Emergency Medicine

## 2018-05-09 DIAGNOSIS — I10 Essential (primary) hypertension: Secondary | ICD-10-CM | POA: Diagnosis not present

## 2018-05-09 DIAGNOSIS — I2510 Atherosclerotic heart disease of native coronary artery without angina pectoris: Secondary | ICD-10-CM | POA: Diagnosis not present

## 2018-05-09 DIAGNOSIS — R1084 Generalized abdominal pain: Secondary | ICD-10-CM | POA: Diagnosis not present

## 2018-05-09 DIAGNOSIS — F10920 Alcohol use, unspecified with intoxication, uncomplicated: Secondary | ICD-10-CM | POA: Insufficient documentation

## 2018-05-09 DIAGNOSIS — Z79899 Other long term (current) drug therapy: Secondary | ICD-10-CM | POA: Insufficient documentation

## 2018-05-09 DIAGNOSIS — F1099 Alcohol use, unspecified with unspecified alcohol-induced disorder: Secondary | ICD-10-CM | POA: Diagnosis present

## 2018-05-09 DIAGNOSIS — J449 Chronic obstructive pulmonary disease, unspecified: Secondary | ICD-10-CM | POA: Diagnosis not present

## 2018-05-09 DIAGNOSIS — F17210 Nicotine dependence, cigarettes, uncomplicated: Secondary | ICD-10-CM | POA: Diagnosis not present

## 2018-05-09 LAB — CBC
HCT: 46.6 % (ref 39.0–52.0)
Hemoglobin: 14.1 g/dL (ref 13.0–17.0)
MCH: 23 pg — AB (ref 26.0–34.0)
MCHC: 30.3 g/dL (ref 30.0–36.0)
MCV: 75.9 fL — ABNORMAL LOW (ref 80.0–100.0)
PLATELETS: 491 10*3/uL — AB (ref 150–400)
RBC: 6.14 MIL/uL — AB (ref 4.22–5.81)
RDW: 21.5 % — ABNORMAL HIGH (ref 11.5–15.5)
WBC: 13.2 10*3/uL — AB (ref 4.0–10.5)
nRBC: 0 % (ref 0.0–0.2)

## 2018-05-09 LAB — HEPATIC FUNCTION PANEL
ALT: 14 U/L (ref 0–44)
AST: 25 U/L (ref 15–41)
Albumin: 3.5 g/dL (ref 3.5–5.0)
Alkaline Phosphatase: 73 U/L (ref 38–126)
BILIRUBIN DIRECT: 0.1 mg/dL (ref 0.0–0.2)
BILIRUBIN INDIRECT: 0.4 mg/dL (ref 0.3–0.9)
Total Bilirubin: 0.5 mg/dL (ref 0.3–1.2)
Total Protein: 6.6 g/dL (ref 6.5–8.1)

## 2018-05-09 LAB — BASIC METABOLIC PANEL
ANION GAP: 18 — AB (ref 5–15)
BUN: 7 mg/dL — ABNORMAL LOW (ref 8–23)
CALCIUM: 8.9 mg/dL (ref 8.9–10.3)
CO2: 19 mmol/L — ABNORMAL LOW (ref 22–32)
Chloride: 98 mmol/L (ref 98–111)
Creatinine, Ser: 0.89 mg/dL (ref 0.61–1.24)
GFR calc Af Amer: >60 mL/min (ref >60)
GLUCOSE: 106 mg/dL — AB (ref 70–99)
Potassium: 3.9 mmol/L (ref 3.5–5.1)
SODIUM: 135 mmol/L (ref 135–145)

## 2018-05-09 LAB — ETHANOL: Alcohol, Ethyl (B): 335 mg/dL (ref <10)

## 2018-05-09 LAB — I-STAT TROPONIN, ED: TROPONIN I, POC: 0.01 ng/mL (ref 0.00–0.08)

## 2018-05-09 LAB — LIPASE, BLOOD: LIPASE: 28 U/L (ref 11–51)

## 2018-05-09 MED ORDER — LORAZEPAM 1 MG PO TABS
1.0000 mg | ORAL_TABLET | Freq: Once | ORAL | Status: AC
  Administered 2018-05-09: 1 mg via ORAL
  Filled 2018-05-09: qty 1

## 2018-05-09 MED ORDER — LACTATED RINGERS IV BOLUS
1000.0000 mL | Freq: Once | INTRAVENOUS | Status: AC
  Administered 2018-05-09: 1000 mL via INTRAVENOUS

## 2018-05-09 MED ORDER — LORAZEPAM 1 MG PO TABS
2.0000 mg | ORAL_TABLET | Freq: Once | ORAL | Status: AC
  Administered 2018-05-10: 2 mg via ORAL
  Filled 2018-05-09: qty 2

## 2018-05-09 MED ORDER — ONDANSETRON HCL 4 MG/2ML IJ SOLN
4.0000 mg | Freq: Once | INTRAMUSCULAR | Status: AC
  Administered 2018-05-09: 4 mg via INTRAVENOUS
  Filled 2018-05-09: qty 2

## 2018-05-09 MED ORDER — ALUM & MAG HYDROXIDE-SIMETH 200-200-20 MG/5ML PO SUSP
30.0000 mL | Freq: Once | ORAL | Status: AC
  Administered 2018-05-09: 30 mL via ORAL
  Filled 2018-05-09: qty 30

## 2018-05-09 MED ORDER — LIDOCAINE VISCOUS HCL 2 % MT SOLN
15.0000 mL | Freq: Once | OROMUCOSAL | Status: AC
  Administered 2018-05-09: 15 mL via OROMUCOSAL
  Filled 2018-05-09: qty 15

## 2018-05-09 NOTE — ED Triage Notes (Signed)
Per EMS, pt was found on the ground throwing up. Pt reports he drank 5 40oz beers and injected 1 gram of cocaine into his veins around 5pm tonight.  Pt is intoxicated however is alert and oriented.  Hx of cardiac issues and stroke, which left pt was poor gait "I can't get around".

## 2018-05-09 NOTE — ED Provider Notes (Signed)
[facility] EMERGENCY DEPARTMENT Provider Note   CSN: [id number] Arrival date & time: 05/09/18  2029     History   Chief Complaint Chief Complaint  Patient presents with  . Alcohol Intoxication  . Emesis  . Chest Pain    HPI Samuel Reyes is a 62 y.o. male.  Patient with history of CAD, alcoholism, gastritis, reflux presents the ED with multiple complaints.  Patient states that he has had some worsening epigastric abdominal pain, nausea, vomiting.  States that he drank about 540 ounces of beer today prior to symptoms getting worse.  He has a history of the same.  Denies any hematemesis, melena.  Overall he states that he just has some burning pain in his chest and he usually gets medication that helps with that.  He has not been compliant with his gastritis medications.  Denies any headache, shortness of breath.  The history is provided by the patient.  Emesis   This is a chronic problem. The current episode started more than 1 week ago. The problem occurs 2 to 4 times per day. The problem has not changed since onset.The emesis has an appearance of stomach contents. There has been no fever. The fever has been present for less than 1 day. Associated symptoms include abdominal pain (intermittent). Pertinent negatives include no arthralgias, no chills, no cough, no diarrhea, no fever, no headaches, no myalgias, no sweats and no URI.    Past Medical History:  Diagnosis Date  . Alcoholism ([place])   . CAD (coronary artery disease) 2002   MI, no intervention required  . COPD (chronic obstructive pulmonary disease) (HCC)    not on home O2  . Depression   . GERD (gastroesophageal reflux disease)   . Headache   . Hypertension   . Stroke [facility]) 2006    Patient Active Problem List   Diagnosis Date Noted  . MDD (major depressive disorder), recurrent severe, without psychosis ([place]) 08/26/2017  . Alcohol withdrawal ([place]) 08/08/2017  . COPD with acute bronchitis ([place])  07/29/2017  . Alcohol use   . Homelessness 06/24/2017  . Lung nodule 09/03/2016  . Tobacco abuse 09/03/2016  . Insomnia 09/03/2016  . HCAP (healthcare-associated pneumonia) 08/22/2016  . Bronchiolitis 08/21/2016  . Malnutrition of moderate degree 08/04/2016  . Atrial tachycardia ([place]) 08/02/2016  . Impaired glucose tolerance 08/02/2016  . Alcohol abuse with alcohol-induced mood disorder ([place]) 12/12/2015  . Syncope 11/15/2015  . Chest pain 11/15/2015  . Nausea vomiting and diarrhea 11/15/2015  . Abdominal pain 11/15/2015  . Major depressive disorder, recurrent episode, moderate with anxious distress ([place]) 10/30/2015  . Alcohol use disorder, severe, dependence ([place]) 10/29/2015  . COPD exacerbation ([place]) 09/20/2015  . Acute respiratory failure with hypoxia ([place]) 09/18/2015  . Suicidal ideation 09/17/2015  . Alcohol intoxication ([place]) 09/17/2015  . Depression 09/17/2015  . Benign essential HTN 09/17/2015  . Hypokalemia 09/17/2015  . Hyponatremia 09/17/2015  . Coffee ground emesis 09/17/2015  . COPD (chronic obstructive pulmonary disease) ([place]) 02/17/2013  . GERD (gastroesophageal reflux disease) 02/17/2013    Past Surgical History:  Procedure Laterality Date  . BACK SURGERY     3 cervical spine surgeries C4-C5 fused  . COLONOSCOPY N/A 01/04/2014   Procedure: COLONOSCOPY;  Surgeon: Danie Binder, MD;  Location: [facility];  Service: Endoscopy;  Laterality: N/A;  1:45  . ESOPHAGOGASTRODUODENOSCOPY N/A 01/04/2014   Procedure: ESOPHAGOGASTRODUODENOSCOPY (EGD);  Surgeon: Danie Binder, MD;  Location: [facility] END[facility]ITE;  Service: Endoscopy;  Laterality:  N/A;  . FINGER SURGERY Left    2nd, 3rd, & 4th fingers were cut off by table saw and reattached  . GASTRECTOMY    . HERNIA REPAIR    . INCISIONAL HERNIA REPAIR N/A 01/20/2014   Procedure: LAPAROSCOPIC RECURRENT  INCISIONAL HERNIA with mesh;  Surgeon: Edward Jolly, MD;  Location: [facility] ORS;  Service: General;  Laterality: N/A;  . rt  knee arthroscopic surgery    . SHOULDER SURGERY Bilateral    3 surgeries on on left, 2 surgeries on right         Home Medications    Prior to Admission medications   Medication Sig Start Date End Date Taking? Authorizing Provider  albuterol (PROVENTIL HFA;VENTOLIN HFA) 108 (90 Base) MCG/ACT inhaler Inhale 1-2 puffs into the lungs every 4 (four) hours as needed for wheezing. 08/29/17   Money, Lowry Ram, FNP  buPROPion (WELLBUTRIN XL) 300 MG 24 hr tablet Take 1 tablet (300 mg total) by mouth daily. For mood control 08/30/17   Money, Lowry Ram, FNP  chlordiazePOXIDE (LIBRIUM) 25 MG capsule 50mg  PO TID x 1D, then 25-50mg  PO BID X 1D, then 25-50mg  PO QD X 1D 04/04/18   Sherwood Gambler, MD  famotidine (PEPCID) 20 MG tablet Take 1 tablet (20 mg total) by mouth 2 (two) times daily. 04/04/18   Sherwood Gambler, MD  gabapentin (NEURONTIN) 100 MG capsule Take 1 capsule (100 mg total) by mouth 3 (three) times daily. 08/29/17   Money, Lowry Ram, FNP  hydrOXYzine (ATARAX/VISTARIL) 25 MG tablet Take 1 tablet (25 mg total) by mouth every 6 (six) hours as needed for itching. 08/29/17   Money, Lowry Ram, FNP  pantoprazole (PROTONIX) 20 MG tablet Take 1 tablet (20 mg total) by mouth daily. 05/10/18 06/09/18  Amairani Shuey, DO  predniSONE (DELTASONE) 20 MG tablet 3 Tabs PO Days 1-3, then 2 tabs PO Days 4-6, then 1 tab PO Day 7-9, then Half Tab PO Day 10-12 09/19/17   Carlisle Cater, PA-C  tiotropium (SPIRIVA HANDIHALER) 18 MCG inhalation capsule Place 1 capsule (18 mcg total) into inhaler and inhale daily. 08/30/17   Money, Lowry Ram, FNP  traZODone (DESYREL) 50 MG tablet Take 1 tablet (50 mg total) by mouth at bedtime as needed for sleep. 08/29/17   Money, Lowry Ram, FNP    Family History Family History  Problem Relation Age of Onset  . Cancer Father        bone  . Cancer Brother        lungs  . Stroke Maternal Grandmother   . Asthma Son        died at age 16 in his sleep   . Spina bifida Son        died at age 68   .  Dementia Mother   . Colon cancer Neg Hx     Social History Social History   Tobacco Use  . Smoking status: Current Every Day Smoker    Packs/day: 2.00    Years: 52.00    Pack years: 104.00    Types: Cigarettes    Start date: 07/30/1966  . Smokeless tobacco: Never Used  . Tobacco comment: down to 1/4 ppd  Substance Use Topics  . Alcohol use: Yes    Alcohol/week: 3.0 - 4.0 standard drinks    Types: 3 - 4 Cans of beer per week    Comment: everyday- 3-4 40 oz  . Drug use: No    Comment: denied using any drugs  Allergies   Bee venom and Penicillins   Review of Systems Review of Systems  Constitutional: Negative for chills and fever.  HENT: Negative for ear pain and sore throat.   Eyes: Negative for pain and visual disturbance.  Respiratory: Negative for cough and shortness of breath.   Cardiovascular: Positive for chest pain. Negative for palpitations.  Gastrointestinal: Positive for abdominal pain (intermittent) and vomiting. Negative for diarrhea.  Genitourinary: Negative for dysuria and hematuria.  Musculoskeletal: Negative for arthralgias, back pain and myalgias.  Skin: Negative for color change and rash.  Neurological: Negative for seizures, syncope and headaches.  All other systems reviewed and are negative.    Physical Exam Updated Vital Signs  ED Triage Vitals  Enc Vitals Group     BP 05/09/18 2043 (!) 121/94     Pulse Rate 05/09/18 2043 (!) 140     Resp 05/09/18 2147 (!) 28     Temp 05/09/18 2043 99 F (37.2 C)     Temp Source 05/09/18 2043 Oral     SpO2 05/09/18 2043 96 %     Weight 05/09/18 2226 145 lb (65.8 kg)     Height 05/09/18 2226 5\' 5"  (1.651 m)     Head Circumference --      Peak Flow --      Pain Score 05/09/18 2043 8     Pain Loc --      Pain Edu? --      Excl. in [place]? --     Physical Exam  Constitutional: He is oriented to person, place, and time. He appears well-developed and well-nourished. No distress.  HENT:  Head:  Normocephalic and atraumatic.  Eyes: Pupils are equal, round, and reactive to light. Conjunctivae and EOM are normal.  Neck: Normal range of motion. Neck supple.  Cardiovascular: Regular rhythm, intact distal pulses and normal pulses. Tachycardia present.  No murmur heard. Pulmonary/Chest: Effort normal and breath sounds normal. No respiratory distress. He has no decreased breath sounds. He has no wheezes. He has no rhonchi. He has no rales.  Abdominal: Soft. Bowel sounds are normal. He exhibits no distension. There is tenderness (epigastric). There is no guarding.  Musculoskeletal: He exhibits no edema.       Right lower leg: He exhibits no edema.       Left lower leg: He exhibits no edema.  Neurological: He is alert and oriented to person, place, and time. No cranial nerve deficit.  Skin: Skin is warm and dry.  Psychiatric: He has a normal mood and affect.  Nursing note and vitals reviewed.    ED Treatments / Results  Labs (all labs ordered are listed, but only abnormal results are displayed) Labs Reviewed  BASIC METABOLIC PANEL - Abnormal; Notable for the following components:      Result Value   CO2 19 (*)    Glucose, Bld 106 (*)    BUN 7 (*)    Anion gap 18 (*)    All other components within normal limits  CBC - Abnormal; Notable for the following components:   WBC 13.2 (*)    RBC 6.14 (*)    MCV 75.9 (*)    MCH 23.0 (*)    RDW 21.5 (*)    Platelets 491 (*)    All other components within normal limits  ETHANOL - Abnormal; Notable for the following components:   Alcohol, Ethyl (B) 335 (*)    All other components within normal limits  I-STAT VENOUS BLOOD GAS, ED -  Abnormal; Notable for the following components:   pCO2, Ven 31.5 (*)    pO2, Ven 59.0 (*)    TCO2 21 (*)    Acid-base deficit 3.0 (*)    All other components within normal limits  RAPID URINE DRUG SCREEN, HOSP PERFORMED  LIPASE, BLOOD  HEPATIC FUNCTION PANEL  I-STAT TROPONIN, ED  I-STAT TROPONIN, ED     EKG EKG Interpretation  Date/Time:  Friday May 09 2018 20:39:48 EDT Ventricular Rate:  140 PR Interval:  122 QRS Duration: 82 QT Interval:  286 QTC Calculation: 436 R Axis:   74 Text Interpretation:  Sinus tachycardia Septal infarct , age undetermined Abnormal ECG Confirmed by Lennice Sites [phone]) on 05/09/2018 10:06:47 PM   Radiology Dg Chest 2 View  Result Date: 05/09/2018 CLINICAL DATA:  Patient with chest pain. EXAM: CHEST - 2 VIEW COMPARISON:  Chest radiograph 04/04/2018 FINDINGS: Normal cardiac and mediastinal contours. No consolidative pulmonary opacities. No pleural effusion or pneumothorax. Thoracic spine degenerative changes. IMPRESSION: No acute cardiopulmonary process. Electronically Signed   By: Lovey Newcomer M.D.   On: 05/09/2018 21:12   Ct Abdomen Pelvis W Contrast  Result Date: 05/10/2018 CLINICAL DATA:  Generalized abdominal pain with nausea and vomiting. EXAM: CT ABDOMEN AND PELVIS WITH CONTRAST TECHNIQUE: Multidetector CT imaging of the abdomen and pelvis was performed using the standard protocol following bolus administration of intravenous contrast. CONTRAST:  160mL OMNIPAQUE IOHEXOL 300 MG/ML  SOLN COMPARISON:  08/21/2016 FINDINGS: Lower chest: The lung bases are clear. Surgical clips at the EG junction. Distal esophageal wall thickening suggesting reflux disease or esophagitis. Hepatobiliary: Diffuse fatty infiltration of the liver. No focal liver lesions. Gallbladder and bile ducts are unremarkable. Pancreas: Unremarkable. No pancreatic ductal dilatation or surrounding inflammatory changes. Spleen: Normal in size without focal abnormality. Adrenals/Urinary Tract: Adrenal glands are unremarkable. Kidneys are normal, without renal calculi, focal lesion, or hydronephrosis. Bladder is unremarkable. Stomach/Bowel: Stomach is within normal limits. Appendix appears normal. No evidence of bowel wall thickening, distention, or inflammatory changes. Vascular/Lymphatic:  Aortic atherosclerosis. No enlarged abdominal or pelvic lymph nodes. Reproductive: Prostate is unremarkable. Other: No free air or free fluid in the abdomen. Minimal periumbilical hernia containing fat. Postoperative changes consistent with right inguinal hernia repair with mesh. Musculoskeletal: No acute or significant osseous findings. IMPRESSION: 1. Distal esophageal wall thickening suggesting reflux disease or esophagitis. 2. Diffuse fatty infiltration of the liver. 3. No acute process in the abdomen or pelvis. Aortic Atherosclerosis (ICD10-I70.0). Electronically Signed   By: Lucienne Capers M.D.   On: 05/10/2018 06:48    Procedures Procedures (including critical care time)  Medications Ordered in ED Medications  lactated ringers bolus 1,000 mL (0 mLs Intravenous Stopped 05/10/18 0008)  ondansetron (ZOFRAN) injection 4 mg (4 mg Intravenous Given 05/09/18 2223)  LORazepam (ATIVAN) tablet 1 mg (1 mg Oral Given 05/09/18 2224)  lidocaine (XYLOCAINE) 2 % viscous mouth solution 15 mL (15 mLs Mouth/Throat Given 05/09/18 2225)  alum & mag hydroxide-simeth (MAALOX/MYLANTA) 200-200-20 MG/5ML suspension 30 mL (30 mLs Oral Given 05/09/18 2224)  LORazepam (ATIVAN) tablet 2 mg (2 mg Oral Given 05/10/18 0032)  lactated ringers bolus 500 mL (0 mLs Intravenous Stopped 05/10/18 0142)  sodium chloride 0.9 % bolus 1,000 mL (0 mLs Intravenous Stopped 05/10/18 0636)  levalbuterol (XOPENEX) nebulizer solution 0.63 mg (0.63 mg Nebulization Given 05/10/18 0440)  acetaminophen (TYLENOL) tablet 650 mg (650 mg Oral Given 05/10/18 0634)  iohexol (OMNIPAQUE) 300 MG/ML solution 100 mL (100 mLs Intravenous Contrast Given 05/10/18 0555)  gi  cocktail (Maalox,Lidocaine,Donnatal) (30 mLs Oral Given 05/10/18 0755)  metoprolol tartrate (LOPRESSOR) tablet 50 mg (50 mg Oral Given 05/10/18 0810)  LORazepam (ATIVAN) injection 1 mg (1 mg Intravenous Given 05/10/18 7867)     Initial Impression / Assessment and Plan / ED Course   I have reviewed the triage vital signs and the nursing notes.  Pertinent labs & imaging results that were available during my care of the patient were reviewed by me and considered in my medical decision making (see chart for details).     Donie Lemelin is a 62 year old male with history of alcohol abuse, CAD, gastritis who presents to the ED with alcohol intoxication, chest pain, emesis, epigastric abdominal pain.  Patient with tachycardia upon arrival but otherwise unremarkable vitals.  No fever.  Patient states that he continues to abuse alcohol daily.  Does not desire to stop drinking.  Had about 5 40 ounces of beers today.  Has had some epigastric abdominal pain associated with emesis that is nonbloody and nonbilious.  States that he has a burning pain in his chest.  Has a history of gastritis and reflux.  Had perforation to his stomach 2 decades ago.  Has not had any issues since.  He states he is not compliant with his antiacid medications including Protonix.  Denies any melena, hematochezia, hematemesis.  States that his last drink was about 6 hours ago.  He states that he does start to withdraw from alcohol after 6 to 10 hours.  He is tachycardic on exam.  But overall is well-appearing.  He has some mild tenderness to the epigastric region but no signs of peritonitis.  Has clear breath sounds.  He appears mildly intoxicated but he is alert and oriented and mentating well.  He is able to fully participate in my examination.  Neurologically intact.  Suspect patient likely with gastritis.  EKG was unremarkable.  Troponin within normal limits. Doubt ACS. No PE or DVT risk factors. No significant electrolyte abnormality, kidney injury, anemia.  No significant leukocytosis.  Chest x-ray showed no signs of pneumonia, no pneumothorax, no pleural effusion.  No free air.  Patient with mild elevation in anion gap but has ETOH level in the 300s which is likely causing that.  Suspect patient likely with  gastritis and was given GI cocktail, IV fluids, IV antiemetics.  Patient given IV Ativan given tachycardia and concern for some early alcohol withdrawal.  Likely tachycardic from metabolizing alcohol as well.  Upon chart review patient has been tachycardic at multiple visits.  Typically heart rate is around 105-115.  Patient was not observed in the ED for several hours had some improvement of his heart rate following multiple doses of Ativan and IV fluids.  Clinically he appears sober.  Patient was signed out to oncoming ED staff with patient pending p.o. challenge, ambulation.  Do not suspect any intra-abdominal process at this time, no concern for GI bleed.  Suspect patient with issue secondary to alcohol abuse and gastritis. No major withdrawal signs. He has no desire to stop drinking at this time.  Please see oncoming ED provider's note for further results, evaluation, disposition the patient.   Final Clinical Impressions(s) / ED Diagnoses   Final diagnoses:  Alcoholic intoxication without complication [facility])    ED Discharge Orders         Ordered    pantoprazole (PROTONIX) 20 MG tablet  Daily     05/10/18 0132           Dorthia Tout,  [place], DO 05/10/18 1006

## 2018-05-10 ENCOUNTER — Emergency Department (HOSPITAL_COMMUNITY): Payer: Medicaid Other

## 2018-05-10 LAB — I-STAT TROPONIN, ED: TROPONIN I, POC: 0 ng/mL (ref 0.00–0.08)

## 2018-05-10 LAB — I-STAT VENOUS BLOOD GAS, ED
Acid-base deficit: 3 mmol/L — ABNORMAL HIGH (ref 0.0–2.0)
Bicarbonate: 20.4 mmol/L (ref 20.0–28.0)
O2 Saturation: 91 %
PO2 VEN: 59 mmHg — AB (ref 32.0–45.0)
TCO2: 21 mmol/L — ABNORMAL LOW (ref 22–32)
pCO2, Ven: 31.5 mmHg — ABNORMAL LOW (ref 44.0–60.0)
pH, Ven: 7.42 (ref 7.250–7.430)

## 2018-05-10 LAB — RAPID URINE DRUG SCREEN, HOSP PERFORMED
Amphetamines: NOT DETECTED
Barbiturates: NOT DETECTED
Benzodiazepines: NOT DETECTED
Cocaine: NOT DETECTED
OPIATES: NOT DETECTED
Tetrahydrocannabinol: NOT DETECTED

## 2018-05-10 MED ORDER — ACETAMINOPHEN 325 MG PO TABS
650.0000 mg | ORAL_TABLET | Freq: Once | ORAL | Status: AC
  Administered 2018-05-10: 650 mg via ORAL
  Filled 2018-05-10: qty 2

## 2018-05-10 MED ORDER — LORAZEPAM 2 MG/ML IJ SOLN
1.0000 mg | Freq: Once | INTRAMUSCULAR | Status: AC
  Administered 2018-05-10: 1 mg via INTRAVENOUS
  Filled 2018-05-10: qty 1

## 2018-05-10 MED ORDER — LACTATED RINGERS IV BOLUS
500.0000 mL | Freq: Once | INTRAVENOUS | Status: AC
  Administered 2018-05-10: 500 mL via INTRAVENOUS

## 2018-05-10 MED ORDER — SODIUM CHLORIDE 0.9 % IV BOLUS
1000.0000 mL | Freq: Once | INTRAVENOUS | Status: AC
  Administered 2018-05-10: 1000 mL via INTRAVENOUS

## 2018-05-10 MED ORDER — LEVALBUTEROL HCL 0.63 MG/3ML IN NEBU
0.6300 mg | INHALATION_SOLUTION | Freq: Once | RESPIRATORY_TRACT | Status: AC
  Administered 2018-05-10: 0.63 mg via RESPIRATORY_TRACT
  Filled 2018-05-10: qty 3

## 2018-05-10 MED ORDER — GI COCKTAIL ~~LOC~~
30.0000 mL | Freq: Once | ORAL | Status: AC
  Administered 2018-05-10: 30 mL via ORAL
  Filled 2018-05-10: qty 30

## 2018-05-10 MED ORDER — IOHEXOL 300 MG/ML  SOLN
100.0000 mL | Freq: Once | INTRAMUSCULAR | Status: AC | PRN
  Administered 2018-05-10: 100 mL via INTRAVENOUS

## 2018-05-10 MED ORDER — METOPROLOL TARTRATE 25 MG PO TABS
50.0000 mg | ORAL_TABLET | Freq: Once | ORAL | Status: AC
  Administered 2018-05-10: 50 mg via ORAL
  Filled 2018-05-10: qty 2

## 2018-05-10 MED ORDER — PANTOPRAZOLE SODIUM 20 MG PO TBEC: 20.0000 mg | DELAYED_RELEASE_TABLET | Freq: Every day | ORAL | 0 refills | Status: DC

## 2018-05-10 NOTE — Discharge Instructions (Addendum)
Avoid all forms of alcohol.  Follow-up with your doctor for problems.

## 2018-05-10 NOTE — ED Provider Notes (Signed)
9:04 AM-at this time the patient is alert and cooperative.  Heart rate 107.  He states he feels like he is ready to be discharged.  He is homeless.  He would like a bus pass so he can "get to the park."  Patient does not have any other requests or needs at this time.   Daleen Bo, MD 05/10/18 [phone]

## 2018-05-10 NOTE — ED Provider Notes (Signed)
I was asked to evaluate the patient by nursing.  Patient was seen earlier by Dr. Ronnald Nian for gastritis and alcohol related dehydration.  He was tachycardic and intoxicated, but otherwise stable and he has presented similarly in the past.  Patient was discharged with plan to ambulate.  However, patient has remained persistently tachycardic.  He is intoxicated clinically.  Given his persistent tachycardia, I reassessed the patient.  His repeat troponin is negative and EKG shows sinus tachycardia, with no signs of arrhythmia.  His blood gas is acceptable.  I suspect this is alcoholic gastritis, though given his history of reported ruptured peptic ulcer disease, with persistent tachycardia, will obtain CT scan.  CT scan shows esophagitis.  I suspect this is the etiology for his reported chest pain, which on my assessment is more so epigastric and left upper quadrant.  His repeat troponin is negative.  He remains mildly tachycardic but appears to be markedly clinically improved.  Patient given his home dose of metoprolol.  Is not tremulous or showing signs of withdrawal.  He plans to continue drinking.  Will plan to reassess after metoprolol.  If heart rate is reasonable, patient can likely be discharged.   Duffy Bruce, MD 05/10/18 [phone]

## 2018-05-10 NOTE — ED Notes (Signed)
To ct

## 2018-05-10 NOTE — ED Notes (Signed)
Patient verbalizes understanding of discharge instructions. Opportunity for questioning and answers were provided. Armband removed by staff, pt discharged from ED.  

## 2018-05-21 ENCOUNTER — Other Ambulatory Visit: Payer: Self-pay

## 2018-05-21 ENCOUNTER — Emergency Department (HOSPITAL_COMMUNITY)
Admission: EM | Admit: 2018-05-21 | Discharge: 2018-05-22 | Disposition: A | Discharge status: Home / Self Care | Payer: Medicaid Other | Attending: Emergency Medicine | Admitting: Emergency Medicine

## 2018-05-21 ENCOUNTER — Emergency Department (HOSPITAL_COMMUNITY): Payer: Medicaid Other

## 2018-05-21 ENCOUNTER — Encounter (HOSPITAL_COMMUNITY): Payer: Self-pay

## 2018-05-21 DIAGNOSIS — F329 Major depressive disorder, single episode, unspecified: Secondary | ICD-10-CM | POA: Diagnosis present

## 2018-05-21 DIAGNOSIS — Z79899 Other long term (current) drug therapy: Secondary | ICD-10-CM | POA: Diagnosis not present

## 2018-05-21 DIAGNOSIS — J449 Chronic obstructive pulmonary disease, unspecified: Secondary | ICD-10-CM | POA: Insufficient documentation

## 2018-05-21 DIAGNOSIS — R45851 Suicidal ideations: Secondary | ICD-10-CM | POA: Diagnosis not present

## 2018-05-21 DIAGNOSIS — F1024 Alcohol dependence with alcohol-induced mood disorder: Secondary | ICD-10-CM | POA: Diagnosis not present

## 2018-05-21 DIAGNOSIS — F10920 Alcohol use, unspecified with intoxication, uncomplicated: Secondary | ICD-10-CM

## 2018-05-21 DIAGNOSIS — F332 Major depressive disorder, recurrent severe without psychotic features: Secondary | ICD-10-CM | POA: Diagnosis not present

## 2018-05-21 DIAGNOSIS — F17210 Nicotine dependence, cigarettes, uncomplicated: Secondary | ICD-10-CM | POA: Insufficient documentation

## 2018-05-21 DIAGNOSIS — I2510 Atherosclerotic heart disease of native coronary artery without angina pectoris: Secondary | ICD-10-CM | POA: Diagnosis not present

## 2018-05-21 DIAGNOSIS — I10 Essential (primary) hypertension: Secondary | ICD-10-CM | POA: Diagnosis not present

## 2018-05-21 DIAGNOSIS — F1020 Alcohol dependence, uncomplicated: Secondary | ICD-10-CM | POA: Diagnosis not present

## 2018-05-21 DIAGNOSIS — K2920 Alcoholic gastritis without bleeding: Secondary | ICD-10-CM | POA: Diagnosis not present

## 2018-05-21 LAB — RAPID URINE DRUG SCREEN, HOSP PERFORMED
AMPHETAMINES: NOT DETECTED
Barbiturates: NOT DETECTED
Benzodiazepines: NOT DETECTED
Cocaine: NOT DETECTED
Opiates: NOT DETECTED
TETRAHYDROCANNABINOL: NOT DETECTED

## 2018-05-21 LAB — CBC WITH DIFFERENTIAL/PLATELET
Abs Immature Granulocytes: 0.03 10*3/uL (ref 0.00–0.07)
Basophils Absolute: 0 10*3/uL (ref 0.0–0.1)
Basophils Relative: 0 %
EOS ABS: 0 10*3/uL (ref 0.0–0.5)
EOS PCT: 0 %
HEMATOCRIT: 35.9 % — AB (ref 39.0–52.0)
Hemoglobin: 11.4 g/dL — ABNORMAL LOW (ref 13.0–17.0)
Immature Granulocytes: 0 %
LYMPHS ABS: 1.1 10*3/uL (ref 0.7–4.0)
Lymphocytes Relative: 12 %
MCH: 24.2 pg — ABNORMAL LOW (ref 26.0–34.0)
MCHC: 31.8 g/dL (ref 30.0–36.0)
MCV: 76.2 fL — AB (ref 80.0–100.0)
MONOS PCT: 11 %
Monocytes Absolute: 1.1 10*3/uL — ABNORMAL HIGH (ref 0.1–1.0)
NRBC: 0 % (ref 0.0–0.2)
Neutro Abs: 7.4 10*3/uL (ref 1.7–7.7)
Neutrophils Relative %: 77 %
Platelets: 373 10*3/uL (ref 150–400)
RBC: 4.71 MIL/uL (ref 4.22–5.81)
RDW: 20.3 % — AB (ref 11.5–15.5)
WBC: 9.7 10*3/uL (ref 4.0–10.5)

## 2018-05-21 LAB — I-STAT TROPONIN, ED
Troponin i, poc: 0 ng/mL (ref 0.00–0.08)
Troponin i, poc: 0.01 ng/mL (ref 0.00–0.08)

## 2018-05-21 LAB — COMPREHENSIVE METABOLIC PANEL
ALBUMIN: 3.6 g/dL (ref 3.5–5.0)
ALT: 22 U/L (ref 0–44)
ANION GAP: 15 (ref 5–15)
AST: 46 U/L — ABNORMAL HIGH (ref 15–41)
Alkaline Phosphatase: 57 U/L (ref 38–126)
BILIRUBIN TOTAL: 0.9 mg/dL (ref 0.3–1.2)
BUN: 10 mg/dL (ref 8–23)
CALCIUM: 8.2 mg/dL — AB (ref 8.9–10.3)
CO2: 23 mmol/L (ref 22–32)
Chloride: 99 mmol/L (ref 98–111)
Creatinine, Ser: 0.76 mg/dL (ref 0.61–1.24)
GFR calc non Af Amer: >60 mL/min (ref >60)
GLUCOSE: 78 mg/dL (ref 70–99)
POTASSIUM: 3.8 mmol/L (ref 3.5–5.1)
SODIUM: 137 mmol/L (ref 135–145)
TOTAL PROTEIN: 7 g/dL (ref 6.5–8.1)

## 2018-05-21 LAB — URINALYSIS, ROUTINE W REFLEX MICROSCOPIC
Bilirubin Urine: NEGATIVE
Glucose, UA: NEGATIVE mg/dL
Hgb urine dipstick: NEGATIVE
Ketones, ur: 5 mg/dL — AB
Leukocytes, UA: NEGATIVE
Nitrite: NEGATIVE
Protein, ur: NEGATIVE mg/dL
Specific Gravity, Urine: 1.003 — ABNORMAL LOW (ref 1.005–1.030)
pH: 6 (ref 5.0–8.0)

## 2018-05-21 LAB — ETHANOL: Alcohol, Ethyl (B): 239 mg/dL — ABNORMAL HIGH (ref <10)

## 2018-05-21 LAB — LIPASE, BLOOD: LIPASE: 23 U/L (ref 11–51)

## 2018-05-21 LAB — SALICYLATE LEVEL: Salicylate Lvl: <7 mg/dL (ref 2.8–30.0)

## 2018-05-21 LAB — ACETAMINOPHEN LEVEL

## 2018-05-21 MED ORDER — SODIUM CHLORIDE 0.9 % IV BOLUS
1000.0000 mL | Freq: Once | INTRAVENOUS | Status: AC
  Administered 2018-05-21: 1000 mL via INTRAVENOUS

## 2018-05-21 MED ORDER — FAMOTIDINE IN NACL 20-0.9 MG/50ML-% IV SOLN
20.0000 mg | Freq: Once | INTRAVENOUS | Status: AC
  Administered 2018-05-21: 20 mg via INTRAVENOUS
  Filled 2018-05-21: qty 50

## 2018-05-21 MED ORDER — LORAZEPAM 2 MG/ML IJ SOLN: 0.0000 mg | Freq: Four times a day (QID) | INTRAMUSCULAR | Status: DC

## 2018-05-21 MED ORDER — ONDANSETRON HCL 4 MG/2ML IJ SOLN
4.0000 mg | Freq: Once | INTRAMUSCULAR | Status: AC
  Administered 2018-05-21: 4 mg via INTRAVENOUS
  Filled 2018-05-21: qty 2

## 2018-05-21 MED ORDER — LORAZEPAM 2 MG/ML IJ SOLN: 0.0000 mg | Freq: Two times a day (BID) | INTRAMUSCULAR | Status: DC

## 2018-05-21 MED ORDER — LORAZEPAM 1 MG PO TABS
0.0000 mg | ORAL_TABLET | Freq: Four times a day (QID) | ORAL | Status: DC
  Administered 2018-05-22 (×2): 1 mg via ORAL
  Filled 2018-05-21 (×2): qty 1

## 2018-05-21 MED ORDER — NICOTINE 21 MG/24HR TD PT24
21.0000 mg | MEDICATED_PATCH | Freq: Once | TRANSDERMAL | Status: DC
  Administered 2018-05-21: 21 mg via TRANSDERMAL
  Filled 2018-05-21: qty 1

## 2018-05-21 MED ORDER — THIAMINE HCL 100 MG/ML IJ SOLN: 100.0000 mg | Freq: Every day | INTRAMUSCULAR | Status: DC

## 2018-05-21 MED ORDER — SUCRALFATE 1 GM/10ML PO SUSP
1.0000 g | Freq: Three times a day (TID) | ORAL | Status: DC
  Administered 2018-05-21 – 2018-05-22 (×3): 1 g via ORAL
  Filled 2018-05-21 (×5): qty 10

## 2018-05-21 MED ORDER — VITAMIN B-1 100 MG PO TABS
100.0000 mg | ORAL_TABLET | Freq: Every day | ORAL | Status: DC
  Administered 2018-05-21 – 2018-05-22 (×2): 100 mg via ORAL
  Filled 2018-05-21 (×2): qty 1

## 2018-05-21 MED ORDER — LORAZEPAM 1 MG PO TABS: 0.0000 mg | ORAL_TABLET | Freq: Two times a day (BID) | ORAL | Status: DC

## 2018-05-21 NOTE — ED Triage Notes (Addendum)
[place] park with [place]. Pt vomited prior to arrival. Pt not vomiting at this time. Pt reports chest pain. Hx of cardiac issues and stroke. Pt states " I want to die". Pt reports not having a plan to kill himself. Pt reports drinking a large amount of alcohol and using 1 gram of cocaine. 132/82 HR 94 RR 18 CBG 109

## 2018-05-21 NOTE — BH Assessment (Signed)
Assessment Note  Samuel Reyes is a 62 y.o. male in ED due to medical issues associated with alcohol intoxication. Pt's BAL was 239 on arrival. Pt also voiced SI w/ no plan. When talking to PA, pt reported SI w/ a plan to sit in front of a car as well as command hallucinations telling him to do so.  Pt is pleasant and cooperative with euthymic mood during TTS assessment. Pt admits to having SI w/ a plan to sit in front of a car for "about a month". When asked what has prevented him from going through with his plan, pt indicated that he "ain't go the balls". Pt denies having any mental health interventions at this time.   Case staffed with Dr. Mariea Clonts. Pt is recommended to be observed overnight and reassessed by psychiatry in the morning. Sophia, PA, notified of the disposition.   Diagnosis: F10.24 Alcohol use d/o, severe, w/ alcohol-induced depressive d/o  Past Medical History:  Past Medical History:  Diagnosis Date  . Alcoholism ([place])   . CAD (coronary artery disease) 2002   MI, no intervention required  . COPD (chronic obstructive pulmonary disease) (HCC)    not on home O2  . Depression   . GERD (gastroesophageal reflux disease)   . Headache   . Hypertension   . Stroke [facility]) 2006    Past Surgical History:  Procedure Laterality Date  . BACK SURGERY     3 cervical spine surgeries C4-C5 fused  . COLONOSCOPY N/A 01/04/2014   Procedure: COLONOSCOPY;  Surgeon: Danie Binder, MD;  Location[facility];  Service: Endoscopy;  Laterality: N/A;  1:45  . ESOPHAGOGASTRODUODENOSCOPY N/A 01/04/2014   Procedure: ESOPHAGOGASTRODUODENOSCOPY (EGD);  Surgeon: Danie Binder, MD;  Location: [facility];  Service: Endoscopy;  Laterality: N/A;  . FINGER SURGERY Left    2nd, 3rd, & 4th fingers were cut off by table saw and reattached  . GASTRECTOMY    . HERNIA REPAIR    . INCISIONAL HERNIA REPAIR N/A 01/20/2014   Procedure: LAPAROSCOPIC RECURRENT  INCISIONAL HERNIA with mesh;  Surgeon: Edward Jolly, MD;  Location: [facility] ORS;  Service: General;  Laterality: N/A;  . rt knee arthroscopic surgery    . SHOULDER SURGERY Bilateral    3 surgeries on on left, 2 surgeries on right     Family History:  Family History  Problem Relation Age of Onset  . Cancer Father        bone  . Cancer Brother        lungs  . Stroke Maternal Grandmother   . Asthma Son        died at age 62 in his sleep   . Spina bifida Son        died at age 69   . Dementia Mother   . Colon cancer Neg Hx     Social History:  reports that he has been smoking cigarettes. He started smoking about 51 years ago. He has a 104.00 pack-year smoking history. He has never used smokeless tobacco. He reports that he drinks about 3.0 - 4.0 standard drinks of alcohol per week. He reports that he does not use drugs.  Additional Social History:  Alcohol / Drug Use Pain Medications: pt denies Prescriptions: pt denies Over the Counter: pt denies History of alcohol / drug use?: Yes Longest period of sobriety (when/how long): 7-8 months Substance #1 Name of Substance 1: alcohol 1 - Age of First Use: 17 1 - Amount (  size/oz): 4-5 40oz beers 1 - Frequency: daily 1 - Duration: ongoing 1 - Last Use / Amount: this morning/2 40oz beers  CIWA: CIWA-Ar BP: 114/61 Pulse Rate: 100 COWS:    Allergies:  Allergies  Allergen Reactions  . Bee Venom Anaphylaxis  . Penicillins Rash    Has patient had a PCN reaction causing immediate rash, facial/tongue/throat swelling, SOB or lightheadedness with hypotension: {Yes Has patient had a PCN reaction causing severe rash involving mucus membranes or skin necrosis: YES Has patient had a PCN reaction that required hospitalization Yes Has patient had a PCN reaction occurring within the last 10 years: YES If all of the above answers are "NO", then may proceed with Cephalosporin use.     Home Medications:  (Not in a hospital admission)  OB/GYN Status:  No LMP for male patient.  General  Assessment Data [facility] of [facility]: [facility] TTS Assessment: In system Is this a Tele or Face-to-Face Assessment?: Face-to-Face Is this an Initial Assessment or a Re-assessment for this encounter?: Initial Assessment Patient Accompanied by:: N/A Language Other than English: No Living Arrangements: Homeless/Shelter What gender do you identify as?: Male Marital status: Widowed Living Arrangements: Alone Can pt return to current living arrangement?: Yes Admission Status: Voluntary Is patient capable of signing voluntary admission?: Yes Referral Source: Self/Family/Friend Insurance type: Medicaid     Crisis Care Plan Living Arrangements: Alone Name of Psychiatrist: none Name of Therapist: none  Education Status Is patient currently in school?: No Is the patient employed, unemployed or receiving disability?: Unemployed, Receiving disability income  Risk to self with the past 6 months Suicidal Ideation: Yes-Currently Present Has patient been a risk to self within the past 6 months prior to admission? : No Suicidal Intent: No Has patient had any suicidal intent within the past 6 months prior to admission? : No Is patient at risk for suicide?: No Suicidal Plan?: Yes-Currently Present Has patient had any suicidal plan within the past 6 months prior to admission? : No Specify Current Suicidal Plan: sit in front of a car Access to Means: Yes Previous Attempts/Gestures: Yes How many times?: 1 Triggers for Past Attempts: Family contact Intentional Self Injurious Behavior: None Family Suicide History: No Recent stressful life event(s): Loss (Comment) Persecutory voices/beliefs?: No Depression: Yes Depression Symptoms: Insomnia, Guilt, Loss of interest in usual pleasures, Feeling worthless/self pity, Feeling angry/irritable Substance abuse history and/or treatment for substance abuse?: Yes Suicide prevention information given to non-admitted patients: Not applicable  Risk to  Others within the past 6 months Homicidal Ideation: No Does patient have any lifetime risk of violence toward others beyond the six months prior to admission? : No Thoughts of Harm to Others: No Current Homicidal Intent: No Current Homicidal Plan: No Access to Homicidal Means: No History of harm to others?: No Assessment of Violence: None Noted Does patient have access to weapons?: No Criminal Charges Pending?: No Does patient have a court date: No Is patient on probation?: No  Psychosis Hallucinations: None noted Delusions: None noted  Mental Status Report Appearance/Hygiene: Unremarkable Eye Contact: Good Motor Activity: Unremarkable Speech: Logical/coherent Level of Consciousness: Alert Mood: Pleasant, Euthymic Affect: Appropriate to circumstance Anxiety Level: Minimal Thought Processes: Coherent, Relevant Judgement: Partial Orientation: Person, Place, Time, Situation Obsessive Compulsive Thoughts/Behaviors: None  Cognitive Functioning Concentration: Normal Memory: Recent Intact, Remote Intact Is patient IDD: No Insight: Fair Impulse Control: Fair Appetite: Fair Have you had any weight changes? : No Change Sleep: Decreased Total Hours of Sleep: 3 Vegetative Symptoms: None  ADLScreening [facility] Assessment Services) Patient's cognitive ability adequate to safely complete daily activities?: Yes Patient able to express need for assistance with ADLs?: Yes Independently performs ADLs?: Yes (appropriate for developmental age)  Prior Inpatient Therapy Prior Inpatient Therapy: No  Prior Outpatient Therapy Prior Outpatient Therapy: No Does patient have an ACCT team?: No Does patient have Intensive In-House Services?  : No Does patient have [facility] services? : No Does patient have P4CC services?: No  ADL Screening (condition at time of admission) Patient's cognitive ability adequate to safely complete daily activities?: Yes Is the patient deaf or have difficulty  hearing?: No Does the patient have difficulty seeing, even when wearing glasses/contacts?: No Does the patient have difficulty concentrating, remembering, or making decisions?: No Patient able to express need for assistance with ADLs?: Yes Does the patient have difficulty dressing or bathing?: No Independently performs ADLs?: Yes (appropriate for developmental age) Does the patient have difficulty walking or climbing stairs?: No Weakness of Legs: None Weakness of Arms/Hands: None  Home Assistive Devices/Equipment Home Assistive Devices/Equipment: None    Abuse/Neglect Assessment (Assessment to be complete while patient is alone) Abuse/Neglect Assessment Can Be Completed: Yes Physical Abuse: Denies Verbal Abuse: Denies Sexual Abuse: Denies Exploitation of patient/patient's resources: Denies Self-Neglect: Denies     Regulatory affairs officer (For Healthcare) Does Patient Have a Medical Advance Directive?: No Would patient like information on creating a medical advance directive?: No - Patient declined          Disposition:  Disposition Initial Assessment Completed for this Encounter: Yes  On Site Evaluation by:   Reviewed with Physician:    Rexene Edison 05/21/2018 6:19 PM

## 2018-05-21 NOTE — Progress Notes (Addendum)
CSW received request for homelessness resources and information for possible SA [place] needs. CSW attempting to follow up at present time.  Alphonse Guild. Aymara Sassi, LCSW, LCAS, CSI Clinical Social Worker Ph: [phone]

## 2018-05-21 NOTE — Progress Notes (Signed)
CSW met with pt to offer resources and as the session progressed, pt accepted meeting schedules for area Alcoholics Anonymous and Narcotics Anonymous 12-Step meetings as well as inpatient/outpatient SA [place] resources.  CSW provided education to the pt as to the efficacy of 12-step programs for community support for those needing support in addition to or other than inpatient/outpatient treatment and provided pt with list of [employer] in [place] and educated the pt on making contact, assessing for open beds and requesting an interview/assessment for admission. Pt appreciated CSW's efforts and thanked the CSW.  Alphonse Guild. Trecia Maring, LCSW, LCAS, CSI Clinical Social Worker Ph: [phone]

## 2018-05-21 NOTE — ED Notes (Signed)
Bed: [facility] Expected date:  Expected time:  Means of arrival:  Comments: EMS/ETOH 

## 2018-05-21 NOTE — ED Notes (Signed)
Bed: PV37 Expected date:  Expected time:  Means of arrival:  Comments: Hold for room 4

## 2018-05-21 NOTE — ED Notes (Addendum)
Pt stated "My plan was to walk in front of a vehicle.  I have no place to go.  [place]  I drink 4-5 40's a day.  I have relatives in [place] but they don't have room for me."  Questioned if pt had received resources for shelters.  Pt denied.  Call made to [place] [place] for resources.

## 2018-05-21 NOTE — ED Notes (Signed)
Samuel Reyes, CSW, in with pt.

## 2018-05-21 NOTE — ED Notes (Signed)
ED TO INPATIENT HANDOFF REPORT  Name/Age/Gender Samuel Reyes 62 y.o. male  Code Status    Code Status Orders  (From admission, onward)         Start     Ordered   05/21/18 1719  Full code  Continuous     05/21/18 1719        Code Status History    Date Active Date Inactive Code Status Order ID Comments User Context   08/26/2017 1533 08/30/2017 1013 Full Code [id number]  Ethelene Hal, NP Inpatient   08/23/2017 2023 08/26/2017 1532 Full Code [id number]  Isla Pence, MD ED   08/08/2017 0107 08/09/2017 1900 Full Code [id number]  Etta Quill, DO ED   08/07/2017 1357 08/08/2017 0106 Full Code [id number]  Fransico Meadow, PA-C ED   08/07/2017 1016 08/07/2017 1357 Full Code [id number]  Fransico Meadow, PA-C ED   07/29/2017 2226 07/30/2017 1621 Full Code [id number]  Roney Jaffe, MD Inpatient   06/25/2017 0058 07/01/2017 2114 DNR [id number]  Karmen Bongo, MD Inpatient   05/17/2017 1334 05/17/2017 2251 Full Code [id number]  Recardo Evangelist, PA-C ED   08/21/2016 2053 08/27/2016 1729 Full Code [id number]  Etta Quill, DO ED   08/02/2016 1803 08/06/2016 1523 Full Code [id number]  Orson Eva, MD ED   12/12/2015 1556 12/13/2015 1700 Full Code [id number]  Patrecia Pour, NP Inpatient   12/11/2015 1644 12/12/2015 1556 Full Code [id number]  Larene Pickett, PA-C ED   11/15/2015 2257 11/17/2015 1826 Full Code [id number]  Ivor Costa, MD ED   10/29/2015 1401 11/07/2015 1648 Full Code [id number]  Encarnacion Slates, NP Inpatient   10/28/2015 1749 10/29/2015 1401 Full Code [id number]  Bjorn Loser, RN ED   10/28/2015 1354 10/28/2015 1749 Full Code [id number]  Dowless, Dondra Spry, PA-C ED   09/17/2015 1844 09/22/2015 0006 Full Code [id number]  Robbie Lis, MD Inpatient   09/02/2015 1652 09/07/2015 1950 Full Code [id number]  Samella Parr, NP Inpatient   09/02/2015 0959 09/02/2015 1652 Full Code [id number]  Jarome Matin, MD ED   06/01/2015 0421 06/01/2015 1148 Full Code [id number]  Little, Wenda Overland, MD ED   03/14/2015  1505 03/18/2015 1729 Full Code [id number]  Patrecia Pour, NP Inpatient   03/13/2015 2244 03/14/2015 1505 Full Code [id number]  Nat Christen, MD ED   02/05/2015 1604 02/07/2015 1407 Full Code [id number]  Annita Brod, MD Inpatient   01/18/2015 2020 01/22/2015 1653 Full Code [id number]  Allyne Gee, MD Inpatient   01/05/2015 0331 01/06/2015 2035 Full Code [id number]  Lavina Hamman, MD ED   01/04/2015 1634 01/05/2015 0331 Full Code [id number]  Vickii Chafe, MD ED   08/31/2014 0818 09/05/2014 1832 DNR [id number]  Melton Alar, PA-C Inpatient   08/31/2014 0744 08/31/2014 0818 Full Code [id number]  Karen Kitchens Inpatient   08/03/2014 1715 08/09/2014 0918 Full Code [id number]  Nicholaus Bloom, MD Inpatient   08/01/2014 1511 08/03/2014 1715 Full Code [id number]  Benjamine Mola, [place] Inpatient   08/01/2014 1059 08/01/2014 1511 Full Code [id number]  Virgel Manifold, MD ED   07/21/2014 0522 07/27/2014 1647 Full Code [id number]  Rise Patience, MD Inpatient   07/20/2014 2117 07/21/2014 0111 Full Code [id number]  Evelina Bucy, MD ED   07/20/2014 1656 07/20/2014 2117 Full Code [id number]  Evelina Bucy, MD ED   03/09/2014 0435 03/12/2014 2043 Full Code [id number]  Laverle Hobby, PA-C Inpatient   03/08/2014 1614 03/09/2014 0435 Full Code  [id number]  Mirna Mires, MD ED   01/20/2014 1213 01/22/2014 1532 Full Code [id number]  Excell Seltzer, MD Inpatient   01/29/2013 1542 01/30/2013 2145 Full Code [id number]  Doy Hutching ED      Home/SNF/Other Home  Chief Complaint detox  Level of Care/Admitting Diagnosis ED Disposition    None      Medical History Past Medical History:  Diagnosis Date  . Alcoholism ([place])   . CAD (coronary artery disease) 2002   MI, no intervention required  . COPD (chronic obstructive pulmonary disease) (HCC)    not on home O2  . Depression   . GERD (gastroesophageal reflux disease)   . Headache   . Hypertension   . Stroke [facility]) 2006    Allergies Allergies  Allergen Reactions   . Bee Venom Anaphylaxis  . Penicillins Rash    Has patient had a PCN reaction causing immediate rash, facial/tongue/throat swelling, SOB or lightheadedness with hypotension: {Yes Has patient had a PCN reaction causing severe rash involving mucus membranes or skin necrosis: YES Has patient had a PCN reaction that required hospitalization Yes Has patient had a PCN reaction occurring within the last 10 years: YES If all of the above answers are "NO", then may proceed with Cephalosporin use.     IV Location/Drains/Wounds Patient Lines/Drains/Airways Status   Active Line/Drains/Airways    Name:   Placement date:   Placement time:   Site:   Days:   Peripheral IV 05/21/18 Right Antecubital   05/21/18    1407    Antecubital   less than 1          Labs/Imaging Results for orders placed or performed during the hospital encounter of 05/21/18 (from the past 48 hour(s))  CBC with Differential     Status: Abnormal   Collection Time: 05/21/18  2:07 PM  Result Value Ref Range   WBC 9.7 4.0 - 10.5 K/uL   RBC 4.71 4.22 - 5.81 MIL/uL   Hemoglobin 11.4 (L) 13.0 - 17.0 g/dL   HCT 35.9 (L) 39.0 - 52.0 %   MCV 76.2 (L) 80.0 - 100.0 fL   MCH 24.2 (L) 26.0 - 34.0 pg   MCHC 31.8 30.0 - 36.0 g/dL   RDW 20.3 (H) 11.5 - 15.5 %   Platelets 373 150 - 400 K/uL   nRBC 0.0 0.0 - 0.2 %   Neutrophils Relative % 77 %   Neutro Abs 7.4 1.7 - 7.7 K/uL   Lymphocytes Relative 12 %   Lymphs Abs 1.1 0.7 - 4.0 K/uL   Monocytes Relative 11 %   Monocytes Absolute 1.1 (H) 0.1 - 1.0 K/uL   Eosinophils Relative 0 %   Eosinophils Absolute 0.0 0.0 - 0.5 K/uL   Basophils Relative 0 %   Basophils Absolute 0.0 0.0 - 0.1 K/uL   Immature Granulocytes 0 %   Abs Immature Granulocytes 0.03 0.00 - 0.07 K/uL    Comment: Performed at [facility], [place] [address] [place], [place] [address]  I-Stat Troponin, ED (not at [facility])     Status: None   Collection Time: 05/21/18  2:14 PM  Result Value Ref Range    Troponin i, poc 0.00 0.00 - 0.08 ng/mL   Comment 3            Comment: Due to the release kinetics of cTnI, a negative result within the first hours of the onset of symptoms does not rule out myocardial infarction with certainty. If myocardial infarction  is still suspected, repeat the test at appropriate intervals.   Urinalysis, Routine w reflex microscopic     Status: Abnormal   Collection Time: 05/21/18  3:15 PM  Result Value Ref Range   Color, Urine STRAW (A) YELLOW   APPearance CLEAR CLEAR   Specific Gravity, Urine 1.003 (L) 1.005 - 1.030   pH 6.0 5.0 - 8.0   Glucose, UA NEGATIVE NEGATIVE mg/dL   Hgb urine dipstick NEGATIVE NEGATIVE   Bilirubin Urine NEGATIVE NEGATIVE   Ketones, ur 5 (A) NEGATIVE mg/dL   Protein, ur NEGATIVE NEGATIVE mg/dL   Nitrite NEGATIVE NEGATIVE   Leukocytes, UA NEGATIVE NEGATIVE    Comment: Performed at [facility] [address], [place], [place] [address]  Rapid urine drug screen (hospital performed)     Status: None   Collection Time: 05/21/18  3:15 PM  Result Value Ref Range   Opiates NONE DETECTED NONE DETECTED   Cocaine NONE DETECTED NONE DETECTED   Benzodiazepines NONE DETECTED NONE DETECTED   Amphetamines NONE DETECTED NONE DETECTED   Tetrahydrocannabinol NONE DETECTED NONE DETECTED   Barbiturates NONE DETECTED NONE DETECTED    Comment: (NOTE) DRUG SCREEN FOR MEDICAL PURPOSES ONLY.  IF CONFIRMATION IS NEEDED FOR ANY PURPOSE, NOTIFY LAB WITHIN 5 DAYS. LOWEST DETECTABLE LIMITS FOR URINE DRUG SCREEN Drug Class                     Cutoff (ng/mL) Amphetamine and metabolites    1000 Barbiturate and metabolites    200 Benzodiazepine                 947 Tricyclics and metabolites     300 Opiates and metabolites        300 Cocaine and metabolites        300 THC                            50 Performed at [facility], [place] [address] [place], [place] [address]   Comprehensive metabolic panel      Status: Abnormal   Collection Time: 05/21/18  3:40 PM  Result Value Ref Range   Sodium 137 135 - 145 mmol/L   Potassium 3.8 3.5 - 5.1 mmol/L   Chloride 99 98 - 111 mmol/L   CO2 23 22 - 32 mmol/L   Glucose, Bld 78 70 - 99 mg/dL   BUN 10 8 - 23 mg/dL   Creatinine, Ser 0.76 0.61 - 1.24 mg/dL   Calcium 8.2 (L) 8.9 - 10.3 mg/dL   Total Protein 7.0 6.5 - 8.1 g/dL   Albumin 3.6 3.5 - 5.0 g/dL   AST 46 (H) 15 - 41 U/L   ALT 22 0 - 44 U/L   Alkaline Phosphatase 57 38 - 126 U/L   Total Bilirubin 0.9 0.3 - 1.2 mg/dL   GFR calc non Af Amer >60 >60 mL/min   GFR calc Af Amer >60 >60 mL/min    Comment: (NOTE) The eGFR has been calculated using the CKD EPI equation. This calculation has not been validated in all clinical situations. eGFR's persistently <60 mL/min signify possible Chronic Kidney Disease.    Anion gap 15 5 - 15    Comment: Performed at [facility], [place] [address], [place], [place] [address]  Lipase, blood     Status: None   Collection Time: 05/21/18  3:40 PM  Result Value Ref Range  Lipase 23 11 - 51 U/L    Comment: Performed at [facility], [place] [address] [place], [place] [address]  Acetaminophen level     Status: Abnormal   Collection Time: 05/21/18  3:40 PM  Result Value Ref Range   Acetaminophen (Tylenol), Serum <10 (L) 10 - 30 ug/mL    Comment: (NOTE) Therapeutic concentrations vary significantly. A range of 10-30 ug/mL  may be an effective concentration for many patients. However, some  are best treated at concentrations outside of this range. Acetaminophen concentrations >150 ug/mL at 4 hours after ingestion  and >50 ug/mL at 12 hours after ingestion are often associated with  toxic reactions. Performed at [facility], [place] [address], [place], [place] [address]   Ethanol     Status: Abnormal   Collection Time: 05/21/18  3:40 PM  Result Value Ref Range   Alcohol, Ethyl (B) 239 (H) <10 mg/dL     Comment: (NOTE) Lowest detectable limit for serum alcohol is 10 mg/dL. For medical purposes only. Performed at [facility], [place] [address], [place], [place] [address]   Salicylate level     Status: None   Collection Time: 05/21/18  3:40 PM  Result Value Ref Range   Salicylate Lvl <7.1 2.8 - 30.0 mg/dL    Comment: Performed at [facility], [place] [address], [place],  [address]  I-Stat Troponin, ED (not at [facility])     Status: None   Collection Time: 05/21/18  5:07 PM  Result Value Ref Range   Troponin i, poc 0.01 0.00 - 0.08 ng/mL   Comment 3            Comment: Due to the release kinetics of cTnI, a negative result within the first hours of the onset of symptoms does not rule out myocardial infarction with certainty. If myocardial infarction is still suspected, repeat the test at appropriate intervals.    Dg Abdomen Acute W/chest  Result Date: 05/21/2018 CLINICAL DATA:  Chest pain and abdominal pain. EXAM: DG ABDOMEN ACUTE W/ 1V CHEST COMPARISON:  CT scan of the abdomen dated 05/10/2018 and chest x-ray dated 05/09/2018 FINDINGS: There is no evidence of dilated bowel loops or free intraperitoneal air. No radiopaque calculi or other significant radiographic abnormality is seen. Surgical clips at the gastroesophageal junction. Heart size and mediastinal contours are within normal limits. Both lungs are clear. IMPRESSION: Negative abdominal radiographs.  No acute cardiopulmonary disease. Electronically Signed   By: Lorriane Shire M.D.   On: 05/21/2018 15:25    Pending Labs Unresulted Labs (From admission, onward)   None      Vitals/Pain Today's Vitals   05/21/18 1530 05/21/18 1600 05/21/18 1615 05/21/18 1630  BP: 131/71 116/74  114/61  Pulse: 99 98 99 100  Resp: _0 Temp:      TempSrc:      SpO2: 95% 98% 97% 97%  PainSc:        Isolation Precautions No active isolations  Medications Medications  sucralfate (CARAFATE) 1  GM/10ML suspension 1 g (1 g Oral Given 05/21/18 1711)  thiamine (VITAMIN B-1) tablet 100 mg (has no administration in time range)    Or  thiamine (B-1) injection 100 mg (has no administration in time range)  nicotine (NICODERM CQ - dosed in mg/24 hours) patch 21 mg (has no administration in time range)  sodium chloride 0.9 % bolus 1,000 mL (0 mLs Intravenous Stopped 05/21/18 1703)  ondansetron (ZOFRAN)  injection 4 mg (4 mg Intravenous Given 05/21/18 1447)  famotidine (PEPCID) IVPB 20 mg premix (0 mg Intravenous Stopped 05/21/18 1537)    Mobility Pt states he normally walks but feels he can't walk right now

## 2018-05-21 NOTE — ED Provider Notes (Signed)
[facility] DEPT Provider Note   CSN: [id number] Arrival date & time: 05/21/18  1301     History   Chief Complaint Chief Complaint  Patient presents with  . Suicidal  . Alcohol Intoxication    HPI Samuel Reyes is a 62 y.o. male for evaluation of suicidal thoughts, nausea/vomiting, and alcohol intoxication.  Patient states he has had 10-12 40s in the past 24 hours.  Last beer was this morning.  Patient reports nausea and vomiting, has stated he is vomited 10 times.  He is unable to tell me if it is bloody.  Patient reports epigastric abdominal pain, states this is chronic.  Additionally, patient reports that he wants to kill himself.  He states this is not new, but he has come up with a plan where he wants to sit in front of a car.  Patient reports he is hearing voices telling him to do just that.  Patient states he used a gram of cocaine yesterday.  He has a history of heart attack following drug use.  History of stroke.  He states he is not taking any of his medications, is not sure what he is supposed to be on.  He smokes cigarettes.  He denies other drug use.  Patient reports chest pain for the past 3 days.  It is constant and dull.  No radiation.  No worsening with exertion.  He denies fevers, chills, cough, shortness of breath, urinary symptoms, abnormal bowel movements.  Additional history obtained chart review, patient with a history of alcoholism, CAD, COPD, GERD, hypertension, stroke, and alcoholic gastritis.  History of depression and anxiety.  Patient has been prescribed albuterol, Wellbutrin, trazodone, gabapentin, protonix, and famotidine in the past.  Seen recently for alcoholic gastritis.  HPI  Past Medical History:  Diagnosis Date  . Alcoholism ([place])   . CAD (coronary artery disease) 2002   MI, no intervention required  . COPD (chronic obstructive pulmonary disease) (HCC)    not on home O2  . Depression   . GERD (gastroesophageal  reflux disease)   . Headache   . Hypertension   . Stroke [facility]) 2006    Patient Active Problem List   Diagnosis Date Noted  . MDD (major depressive disorder), recurrent severe, without psychosis ([place]) 08/26/2017  . Alcohol withdrawal ([place]) 08/08/2017  . COPD with acute bronchitis ([place]) 07/29/2017  . Alcohol use   . Homelessness 06/24/2017  . Lung nodule 09/03/2016  . Tobacco abuse 09/03/2016  . Insomnia 09/03/2016  . HCAP (healthcare-associated pneumonia) 08/22/2016  . Bronchiolitis 08/21/2016  . Malnutrition of moderate degree 08/04/2016  . Atrial tachycardia ([place]) 08/02/2016  . Impaired glucose tolerance 08/02/2016  . Alcohol abuse with alcohol-induced mood disorder ([place]) 12/12/2015  . Syncope 11/15/2015  . Chest pain 11/15/2015  . Nausea vomiting and diarrhea 11/15/2015  . Abdominal pain 11/15/2015  . Major depressive disorder, recurrent episode, moderate with anxious distress ([place]) 10/30/2015  . Alcohol use disorder, severe, dependence ([place]) 10/29/2015  . COPD exacerbation ([place]) 09/20/2015  . Acute respiratory failure with hypoxia ([place]) 09/18/2015  . Suicidal ideation 09/17/2015  . Alcohol intoxication ([place]) 09/17/2015  . Depression 09/17/2015  . Benign essential HTN 09/17/2015  . Hypokalemia 09/17/2015  . Hyponatremia 09/17/2015  . Coffee ground emesis 09/17/2015  . COPD (chronic obstructive pulmonary disease) ([place]) 02/17/2013  . GERD (gastroesophageal reflux disease) 02/17/2013    Past Surgical History:  Procedure Laterality Date  . BACK SURGERY     3 cervical spine surgeries  C4-C5 fused  . COLONOSCOPY N/A 01/04/2014   Procedure: COLONOSCOPY;  Surgeon: Danie Binder, MD;  Location: [facility];  Service: Endoscopy;  Laterality: N/A;  1:45  . ESOPHAGOGASTRODUODENOSCOPY N/A 01/04/2014   Procedure: ESOPHAGOGASTRODUODENOSCOPY (EGD);  Surgeon: Danie Binder, MD;  Location: [facility];  Service: Endoscopy;  Laterality: N/A;  . FINGER SURGERY Left    2nd, 3rd, & 4th  fingers were cut off by table saw and reattached  . GASTRECTOMY    . HERNIA REPAIR    . INCISIONAL HERNIA REPAIR N/A 01/20/2014   Procedure: LAPAROSCOPIC RECURRENT  INCISIONAL HERNIA with mesh;  Surgeon: Edward Jolly, MD;  Location: [facility] ORS;  Service: General;  Laterality: N/A;  . rt knee arthroscopic surgery    . SHOULDER SURGERY Bilateral    3 surgeries on on left, 2 surgeries on right         Home Medications    Prior to Admission medications   Medication Sig Start Date End Date Taking? Authorizing Provider  Aspirin-Acetaminophen-Caffeine (GOODYS EXTRA STRENGTH PO) Take 2 packets by mouth daily as needed (pain).   Yes [provider]  albuterol (PROVENTIL HFA;VENTOLIN HFA) 108 (90 Base) MCG/ACT inhaler Inhale 1-2 puffs into the lungs every 4 (four) hours as needed for wheezing. Patient not taking: Reported on 05/21/2018 08/29/17   Money, Lowry Ram, FNP  buPROPion (WELLBUTRIN XL) 300 MG 24 hr tablet Take 1 tablet (300 mg total) by mouth daily. For mood control Patient not taking: Reported on 05/21/2018 08/30/17   Money, Lowry Ram, FNP  chlordiazePOXIDE (LIBRIUM) 25 MG capsule 50mg  PO TID x 1D, then 25-50mg  PO BID X 1D, then 25-50mg  PO QD X 1D Patient not taking: Reported on 05/21/2018 04/04/18   Sherwood Gambler, MD  famotidine (PEPCID) 20 MG tablet Take 1 tablet (20 mg total) by mouth 2 (two) times daily. Patient not taking: Reported on 05/21/2018 04/04/18   Sherwood Gambler, MD  gabapentin (NEURONTIN) 100 MG capsule Take 1 capsule (100 mg total) by mouth 3 (three) times daily. Patient not taking: Reported on 05/21/2018 08/29/17   Money, Lowry Ram, FNP  hydrOXYzine (ATARAX/VISTARIL) 25 MG tablet Take 1 tablet (25 mg total) by mouth every 6 (six) hours as needed for itching. Patient not taking: Reported on 05/21/2018 08/29/17   Money, Lowry Ram, FNP  pantoprazole (PROTONIX) 20 MG tablet Take 1 tablet (20 mg total) by mouth daily. Patient not taking: Reported on 05/21/2018 05/10/18  06/09/18  Lennice Sites, DO  predniSONE (DELTASONE) 20 MG tablet 3 Tabs PO Days 1-3, then 2 tabs PO Days 4-6, then 1 tab PO Day 7-9, then Half Tab PO Day 10-12 Patient not taking: Reported on 05/21/2018 09/19/17   Carlisle Cater, PA-C  tiotropium (SPIRIVA HANDIHALER) 18 MCG inhalation capsule Place 1 capsule (18 mcg total) into inhaler and inhale daily. Patient not taking: Reported on 05/21/2018 08/30/17   Money, Lowry Ram, FNP  traZODone (DESYREL) 50 MG tablet Take 1 tablet (50 mg total) by mouth at bedtime as needed for sleep. Patient not taking: Reported on 05/21/2018 08/29/17   Money, Lowry Ram, FNP    Family History Family History  Problem Relation Age of Onset  . Cancer Father        bone  . Cancer Brother        lungs  . Stroke Maternal Grandmother   . Asthma Son        died at age 39 in his sleep   . Spina bifida Son  died at age 5   . Dementia Mother   . Colon cancer Neg Hx     Social History Social History   Tobacco Use  . Smoking status: Current Every Day Smoker    Packs/day: 2.00    Years: 52.00    Pack years: 104.00    Types: Cigarettes    Start date: 07/30/1966  . Smokeless tobacco: Never Used  . Tobacco comment: down to 1/4 ppd  Substance Use Topics  . Alcohol use: Yes    Alcohol/week: 3.0 - 4.0 standard drinks    Types: 3 - 4 Cans of beer per week    Comment: everyday- 3-4 40 oz  . Drug use: No    Comment: denied using any drugs     Allergies   Bee venom and Penicillins   Review of Systems Review of Systems  Cardiovascular: Positive for chest pain.  Gastrointestinal: Positive for abdominal pain, nausea and vomiting.  Psychiatric/Behavioral: Positive for dysphoric mood and suicidal ideas.  All other systems reviewed and are negative.    Physical Exam Updated Vital Signs BP 123/70 (BP Location: Right Arm)   Pulse 70   Temp 98 F (36.7 C) (Oral)   Resp 19   Ht 5\' 5"  (1.651 m)   Wt 65.8 kg   SpO2 100%   BMI 24.13 kg/m   Physical Exam   Constitutional: He is oriented to person, place, and time.  Patient appears unkept and chronically ill.  In no acute distress  HENT:  Head: Normocephalic and atraumatic.  MM dry  Eyes: Pupils are equal, round, and reactive to light. Conjunctivae and EOM are normal.  Neck: Normal range of motion. Neck supple.  Cardiovascular: Regular rhythm and intact distal pulses.  Tachycardic ~115  Pulmonary/Chest: Effort normal and breath sounds normal. No respiratory distress. He has no wheezes.  Scattered wheezing in all fields  Abdominal: Soft. He exhibits no distension and no mass. There is tenderness. There is no rebound and no guarding.  TTP of epigastric abd. Soft without rigidity, guarding, or distention. Negative rebound. No signs of peritonitis  Musculoskeletal: Normal range of motion.  Neurological: He is alert and oriented to person, place, and time.  Skin: Skin is warm and dry. Capillary refill takes less than 2 seconds.  Psychiatric: He is actively hallucinating. He expresses suicidal ideation. He expresses no homicidal ideation. He expresses suicidal plans. He expresses no homicidal plans.  Pt reports SI with plan. Reporting auditory hallucinations (and sees himself walking into traffic)  Nursing note and vitals reviewed.    ED Treatments / Results  Labs (all labs ordered are listed, but only abnormal results are displayed) Labs Reviewed  CBC WITH DIFFERENTIAL/PLATELET - Abnormal; Notable for the following components:      Result Value   Hemoglobin 11.4 (*)    HCT 35.9 (*)    MCV 76.2 (*)    MCH 24.2 (*)    RDW 20.3 (*)    Monocytes Absolute 1.1 (*)    All other components within normal limits  URINALYSIS, ROUTINE W REFLEX MICROSCOPIC - Abnormal; Notable for the following components:   Color, Urine STRAW (*)    Specific Gravity, Urine 1.003 (*)    Ketones, ur 5 (*)    All other components within normal limits  COMPREHENSIVE METABOLIC PANEL - Abnormal; Notable for the  following components:   Calcium 8.2 (*)    AST 46 (*)    All other components within normal limits  ACETAMINOPHEN LEVEL -  Abnormal; Notable for the following components:   Acetaminophen (Tylenol), Serum <10 (*)    All other components within normal limits  ETHANOL - Abnormal; Notable for the following components:   Alcohol, Ethyl (B) 239 (*)    All other components within normal limits  RAPID URINE DRUG SCREEN, HOSP PERFORMED  LIPASE, BLOOD  SALICYLATE LEVEL  I-STAT TROPONIN, ED  I-STAT TROPONIN, ED    EKG EKG Interpretation  Date/Time:  Wednesday May 21 2018 14:16:53 EDT Ventricular Rate:  98 PR Interval:    QRS Duration: 101 QT Interval:  376 QTC Calculation: 481 R Axis:   67 Text Interpretation:  Sinus rhythm Abnormal R-wave progression, early transition Borderline prolonged QT interval Artifact in lead(s) I III aVR aVL V1 Confirmed by Quintella Reichert [phone]) on 05/21/2018 2:28:23 PM   Radiology Dg Abdomen Acute W/chest  Result Date: 05/21/2018 CLINICAL DATA:  Chest pain and abdominal pain. EXAM: DG ABDOMEN ACUTE W/ 1V CHEST COMPARISON:  CT scan of the abdomen dated 05/10/2018 and chest x-ray dated 05/09/2018 FINDINGS: There is no evidence of dilated bowel loops or free intraperitoneal air. No radiopaque calculi or other significant radiographic abnormality is seen. Surgical clips at the gastroesophageal junction. Heart size and mediastinal contours are within normal limits. Both lungs are clear. IMPRESSION: Negative abdominal radiographs.  No acute cardiopulmonary disease. Electronically Signed   By: Lorriane Shire M.D.   On: 05/21/2018 15:25    Procedures Procedures (including critical care time)  Medications Ordered in ED Medications  sucralfate (CARAFATE) 1 GM/10ML suspension 1 g (1 g Oral Given 05/21/18 2141)  thiamine (VITAMIN B-1) tablet 100 mg (100 mg Oral Given 05/21/18 1855)    Or  thiamine (B-1) injection 100 mg ( Intravenous See Alternative 05/21/18  1855)  nicotine (NICODERM CQ - dosed in mg/24 hours) patch 21 mg (21 mg Transdermal Patch Applied 05/21/18 1855)  sodium chloride 0.9 % bolus 1,000 mL (0 mLs Intravenous Stopped 05/21/18 1703)  ondansetron (ZOFRAN) injection 4 mg (4 mg Intravenous Given 05/21/18 1447)  famotidine (PEPCID) IVPB 20 mg premix (0 mg Intravenous Stopped 05/21/18 1537)     Initial Impression / Assessment and Plan / ED Course  I have reviewed the triage vital signs and the nursing notes.  Pertinent labs & imaging results that were available during my care of the patient were reviewed by me and considered in my medical decision making (see chart for details).      Patient presenting for evaluation of chest pain, nausea, vomiting, epigastric pain, and suicidal thoughts.  Patient with a history of alcoholic gastritis, has had significant alcohol intake in the past 24 hours.  Initial evaluation, patient appears unkept, but in no acute distress.  Mildly tachycardic.  Epigastric abdominal pain, but otherwise abdominal exam is reassuring.  Will obtain labs, urine, and give antiemetics and Pepcid for pain control. Acute chest/abd to r/o free air.  On reassessment, patient reports symptoms are improving.  He is asking for something to eat/drink.  Heart rate improved.  Labs reassuring, no leukocytosis.  Kidney, liver, pancreatic function reassuring.  Troponin negative.  EKG without STEMI.  Will obtain delta troponin.  Ethanol elevated at 239.  Patient tolerating p.o. without difficulty.  Repeat troponin negative.  At this time, doubt ACS, interabdominal infection, perforation, obstruction, or surgical abdomen.  Patient is medically cleared for TTS eval at this time.  Patient evaluated by behavioral health team, recommend overnight observation and reassessment by psychiatry in the morning. Pt is not taking medications at  home. No home meds to reorder. Nicotine patch ordered. CIWA started.    Final Clinical Impressions(s) / ED  Diagnoses   Final diagnoses:  Suicidal ideation  Alcoholic intoxication without complication ([place])  Acute alcoholic gastritis, presence of bleeding unspecified    ED Discharge Orders    None       Franchot Heidelberg, PA-C 05/21/18 2217    Quintella Reichert, MD 05/22/18 [phone]

## 2018-05-21 NOTE — ED Notes (Signed)
Bed: WA04 Expected date:  Expected time:  Means of arrival:  Comments: Hold for Hall-C

## 2018-05-22 DIAGNOSIS — F1020 Alcohol dependence, uncomplicated: Secondary | ICD-10-CM

## 2018-05-22 DIAGNOSIS — F332 Major depressive disorder, recurrent severe without psychotic features: Secondary | ICD-10-CM

## 2018-05-22 MED ORDER — BUPROPION HCL ER (XL) 150 MG PO TB24: 150.0000 mg | ORAL_TABLET | Freq: Every day | ORAL | Status: DC

## 2018-05-22 MED ORDER — BUPROPION HCL ER (XL) 150 MG PO TB24: 150.0000 mg | ORAL_TABLET | Freq: Every day | ORAL | 0 refills | Status: DC

## 2018-05-22 MED ORDER — TRAZODONE HCL 50 MG PO TABS: 50.0000 mg | ORAL_TABLET | Freq: Every evening | ORAL | 0 refills | Status: DC | PRN

## 2018-05-22 MED ORDER — GABAPENTIN 300 MG PO CAPS: 300.0000 mg | ORAL_CAPSULE | Freq: Three times a day (TID) | ORAL | Status: DC

## 2018-05-22 MED ORDER — GABAPENTIN 300 MG PO CAPS: 300.0000 mg | ORAL_CAPSULE | Freq: Three times a day (TID) | ORAL | 0 refills | Status: DC

## 2018-05-22 MED ORDER — TRAZODONE HCL 50 MG PO TABS: 50.0000 mg | ORAL_TABLET | Freq: Every evening | ORAL | Status: DC | PRN

## 2018-05-22 NOTE — ED Notes (Signed)
Bed: WBH43 Expected date:  Expected time:  Means of arrival:  Comments: 

## 2018-05-22 NOTE — Patient Outreach (Signed)
ED Peer Support Specialist Patient Intake (Complete at intake & 30-60 Day Follow-up)  Name: Samuel Reyes  MRN: [id number]  Age: 62 y.o.   Date of Admission: 05/22/2018  Intake: Initial Comments:      Primary Reason Admitted: SI, alcohol use  Lab values: Alcohol/ETOH: Positive Positive UDS? No Amphetamines: No Barbiturates: No Benzodiazepines: No Cocaine: No Opiates: No Cannabinoids: No  Demographic information: Gender: Male Ethnicity: White Marital Status: Widowed Insurance Status: Medicaid(Medicaid [place] - Medicaid of [place]) Ecologist (Work Neurosurgeon, Physicist, medical, Research scientist (life sciences)) Lives with: Alone Living situation: Homeless  Reported Patient History: Patient reported health conditions: Depression, COPD, Hypertension Patient aware of HIV and hepatitis status: No  In past year, has patient visited ED for any reason? Yes  Number of ED visits: 9  Reason(s) for visit: alcohol usex3, rash, viral URI with cough, COPDx5  In past year, has patient been hospitalized for any reason? Yes  Number of hospitalizations: 4  Reason(s) for hospitalization: COPDx2, alcohol use/chest pain, trazadone overdose with SI and alcohol use [facility] 08/26/17  In past year, has patient been arrested? No  Number of arrests:    Reason(s) for arrest:    In past year, has patient been incarcerated? No  Number of incarcerations:    Reason(s) for incarceration:    In past year, has patient received medication-assisted treatment? No  In past year, patient received the following treatments: Residential treatment (non-hospital)([facility])  In past year, has patient received any harm reduction services? No  Did this include any of the following?    In past year, has patient received care from a mental health provider for diagnosis other than SUD? No([facility] from [place])  In past year, is this first time patient has overdosed? (overdosed once in the past  year on trazadone)  Number of past overdoses:    In past year, is this first time patient has been hospitalized for an overdose? No  Number of hospitalizations for overdose(s): 1  Is patient currently receiving treatment for a mental health diagnosis? No  Patient reports experiencing difficulty participating in SUD treatment: No    Most important reason(s) for this difficulty?    Has patient received prior services for treatment? Yes([facility] for 28 days)  In past, patient has received services from following agencies: Other (comment)([facility] for 28 days)  Plan of Care:  Suggested follow up at these agencies/treatment centers: (CSW met with patient last night 05/22/18 and talked to the patient about how to get connected to substance use recovery resources. )  Other information: CPSS met with the patient to provide substance use recovery support and help with substance use recovery resources. Patient plans to use resource list given to him by CSW and assess his options when he is discharged from the [facility]. CSW educated on different recovery resources and how to get connected to each resource. Some of the resources including homeless shelter resources, outpatient/residential substance use treatment, [facility] options for [place], and NA/AA meeting list. CPSS also provided CPSS contact information. CPSS strongly encouraged the patient to follow up with CPSS for further help with getting connected to recovery resources if needed after discharge from [facility].   Mason Jim, CPSS  05/22/2018 11:50 AM

## 2018-05-22 NOTE — ED Notes (Signed)
Pt discharged safely with resources .  Pt was given bus pass and all belongings were returned to pt.  Discharge instructions and RX were returned .

## 2018-05-22 NOTE — Consult Note (Addendum)
[facility] Psych ED Discharge  05/22/2018 12:07 PM Lanny Lipkin  MRN:  [id number] Principal Problem: Alcohol use disorder, severe, dependence [facility]) Discharge Diagnoses:  Patient Active Problem List   Diagnosis Date Noted  . MDD (major depressive disorder), recurrent severe, without psychosis ([place]) [F33.2] 08/26/2017  . Alcohol withdrawal ([place]) [F10.239] 08/08/2017  . COPD with acute bronchitis ([place]) [J44.0, J20.9] 07/29/2017  . Alcohol use [Z72.89]   . Homelessness [Z59.0] 06/24/2017  . Lung nodule [R91.1] 09/03/2016  . Tobacco abuse [Z72.0] 09/03/2016  . Insomnia [G47.00] 09/03/2016  . HCAP (healthcare-associated pneumonia) [J18.9] 08/22/2016  . Bronchiolitis [J21.9] 08/21/2016  . Malnutrition of moderate degree [E44.0] 08/04/2016  . Atrial tachycardia ([place]) [I47.1] 08/02/2016  . Impaired glucose tolerance [R73.02] 08/02/2016  . Alcohol abuse with alcohol-induced mood disorder ([place]) [F10.14] 12/12/2015  . Syncope [R55] 11/15/2015  . Chest pain [R07.9] 11/15/2015  . Nausea vomiting and diarrhea [R11.2, R19.7] 11/15/2015  . Abdominal pain [R10.9] 11/15/2015  . Major depressive disorder, recurrent episode, moderate with anxious distress ([place]) [F33.1] 10/30/2015  . Alcohol use disorder, severe, dependence ([place]) [F10.20] 10/29/2015  . COPD exacerbation ([place]) [J44.1] 09/20/2015  . Acute respiratory failure with hypoxia ([place]) [J96.01] 09/18/2015  . Suicidal ideation [R45.851] 09/17/2015  . Alcohol intoxication ([place]) [F10.929] 09/17/2015  . Depression [F32.9] 09/17/2015  . Benign essential HTN [I10] 09/17/2015  . Hypokalemia [E87.6] 09/17/2015  . Hyponatremia [E87.1] 09/17/2015  . Coffee ground emesis [K92.0] 09/17/2015  . COPD (chronic obstructive pulmonary disease) ([place]) [J44.9] 02/17/2013  . GERD (gastroesophageal reflux disease) [K21.9] 02/17/2013    Subjective:  Mr. Cappella denies SI, HI or VH. He endorses AH in the setting of alcohol withdrawal. He is intermittently tremulous on  exam. He denies nausea or vomiting. He reports drinking 4-5 forty ounce beers daily for the past 2 months. His longest period of sobriety was 6-8 months. He has struggled with alcohol abuse since his wife passed away 6 years ago. He reports that he has not taken his medications for the past month. He is followed at [facility]. He is informed about the importance of substance abuse treatment and rehab is recommended. He declines treatment at this time because he would like to discharge to get his social security check.   Total Time spent with patient: 30 minutes  Past Psychiatric History: Alcohol abuse and depression.   Past Medical History:  Past Medical History:  Diagnosis Date  . Alcoholism ([place])   . CAD (coronary artery disease) 2002   MI, no intervention required  . COPD (chronic obstructive pulmonary disease) (HCC)    not on home O2  . Depression   . GERD (gastroesophageal reflux disease)   . Headache   . Hypertension   . Stroke [facility]) 2006   Past Surgical History:  Procedure Laterality Date  . BACK SURGERY     3 cervical spine surgeries C4-C5 fused  . COLONOSCOPY N/A 01/04/2014   Procedure: COLONOSCOPY;  Surgeon: Danie Binder, MD;  Location[facility]O [facility];  Service: Endoscopy;  Laterality: N/A;  1:45  . ESOPHAGOGASTRODUODENOSCOPY N/A 01/04/2014   Procedure: ESOPHAGOGASTRODUODENOSCOPY (EGD);  Surgeon: Danie Binder, MD;  Location: [facility] ENDO SUITE;  Service: Endoscopy;  Laterality: N/A;  . FINGER SURGERY Left    2nd, 3rd, & 4th fingers were cut off by table saw and reattached  . GASTRECTOMY    . HERNIA REPAIR    . INCISIONAL HERNIA REPAIR N/A 01/20/2014   Procedure: LAPAROSCOPIC RECURRENT  INCISIONAL HERNIA with mesh;  Surgeon: Edward Jolly, MD;  Location: [facility] ORS;  Service: General;  Laterality: N/A;  . rt knee arthroscopic surgery    . SHOULDER SURGERY Bilateral    3 surgeries on on left, 2 surgeries on right    Family History:  Family History   Problem Relation Age of Onset  . Cancer Father        bone  . Cancer Brother        lungs  . Stroke Maternal Grandmother   . Asthma Son        died at age 54 in his sleep   . Spina bifida Son        died at age 46   . Dementia Mother   . Colon cancer Neg Hx    Family Psychiatric  History: Mother-dementia.   Social History:  Social History   Substance and Sexual Activity  Alcohol Use Yes  . Alcohol/week: 3.0 - 4.0 standard drinks  . Types: 3 - 4 Cans of beer per week   Comment: everyday- 3-4 40 oz    Social History   Substance and Sexual Activity  Drug Use No   Comment: denied using any drugs   Social History   Socioeconomic History  . Marital status: Widowed    Spouse name: Not on file  . Number of children: Not on file  . Years of education: Not on file  . Highest education level: Not on file  Occupational History  . Occupation: Disability  Social Needs  . Financial resource strain: Not on file  . Food insecurity:    Worry: Not on file    Inability: Not on file  . Transportation needs:    Medical: Not on file    Non-medical: Not on file  Tobacco Use  . Smoking status: Current Every Day Smoker    Packs/day: 2.00    Years: 52.00    Pack years: 104.00    Types: Cigarettes    Start date: 07/30/1966  . Smokeless tobacco: Never Used  . Tobacco comment: down to 1/4 ppd  Substance and Sexual Activity  . Alcohol use: Yes    Alcohol/week: 3.0 - 4.0 standard drinks    Types: 3 - 4 Cans of beer per week    Comment: everyday- 3-4 40 oz  . Drug use: No    Comment: denied using any drugs  . Sexual activity: Never  Lifestyle  . Physical activity:    Days per week: Not on file    Minutes per session: Not on file  . Stress: Not on file  Relationships  . Social connections:    Talks on phone: Not on file    Gets together: Not on file    Attends religious service: Not on file    Active member of club or organization: Not on file    Attends meetings of clubs or  organizations: Not on file    Relationship status: Not on file  Other Topics Concern  . Not on file  Social History Narrative  . Not on file    Has this patient used any form of tobacco in the last 30 days? (Cigarettes, Smokeless Tobacco, Cigars, and/or Pipes) Prescription not provided because: N/A  Current Medications: Current Facility-Administered Medications  Medication Dose Route Frequency Provider Last Rate Last Dose  . LORazepam (ATIVAN) injection 0-4 mg  0-4 mg Intravenous Q6H Caccavale, Sophia, PA-C       Or  . LORazepam (ATIVAN) tablet 0-4 mg  0-4  mg Oral Q6H Caccavale, Sophia, PA-C   1 mg at 05/22/18 1108  . [START ON 05/24/2018] LORazepam (ATIVAN) injection 0-4 mg  0-4 mg Intravenous Q12H Caccavale, Sophia, PA-C       Or  . [START ON 05/24/2018] LORazepam (ATIVAN) tablet 0-4 mg  0-4 mg Oral Q12H Caccavale, Sophia, PA-C      . nicotine (NICODERM CQ - dosed in mg/24 hours) patch 21 mg  21 mg Transdermal Once Caccavale, Sophia, PA-C   21 mg at 05/21/18 1855  . sucralfate (CARAFATE) 1 GM/10ML suspension 1 g  1 g Oral TID WC & HS Caccavale, Sophia, PA-C   1 g at 05/22/18 0845  . thiamine (VITAMIN B-1) tablet 100 mg  100 mg Oral Daily Caccavale, Sophia, PA-C   100 mg at 05/22/18 1059   Or  . thiamine (B-1) injection 100 mg  100 mg Intravenous Daily Caccavale, Sophia, PA-C       Current Outpatient Medications  Medication Sig Dispense Refill  . Aspirin-Acetaminophen-Caffeine (GOODYS EXTRA STRENGTH PO) Take 2 packets by mouth daily as needed (pain).    Marland Kitchen albuterol (PROVENTIL HFA;VENTOLIN HFA) 108 (90 Base) MCG/ACT inhaler Inhale 1-2 puffs into the lungs every 4 (four) hours as needed for wheezing. (Patient not taking: Reported on 05/21/2018) 1 Inhaler 0  . buPROPion (WELLBUTRIN XL) 300 MG 24 hr tablet Take 1 tablet (300 mg total) by mouth daily. For mood control (Patient not taking: Reported on 05/21/2018) 30 tablet 0  . chlordiazePOXIDE (LIBRIUM) 25 MG capsule 50mg  PO TID x 1D, then  25-50mg  PO BID X 1D, then 25-50mg  PO QD X 1D (Patient not taking: Reported on 05/21/2018) 10 capsule 0  . famotidine (PEPCID) 20 MG tablet Take 1 tablet (20 mg total) by mouth 2 (two) times daily. (Patient not taking: Reported on 05/21/2018) 30 tablet 0  . gabapentin (NEURONTIN) 100 MG capsule Take 1 capsule (100 mg total) by mouth 3 (three) times daily. (Patient not taking: Reported on 05/21/2018) 90 capsule 0  . hydrOXYzine (ATARAX/VISTARIL) 25 MG tablet Take 1 tablet (25 mg total) by mouth every 6 (six) hours as needed for itching. (Patient not taking: Reported on 05/21/2018) 30 tablet 0  . pantoprazole (PROTONIX) 20 MG tablet Take 1 tablet (20 mg total) by mouth daily. (Patient not taking: Reported on 05/21/2018) 30 tablet 0  . predniSONE (DELTASONE) 20 MG tablet 3 Tabs PO Days 1-3, then 2 tabs PO Days 4-6, then 1 tab PO Day 7-9, then Half Tab PO Day 10-12 (Patient not taking: Reported on 05/21/2018) 20 tablet 0  . tiotropium (SPIRIVA HANDIHALER) 18 MCG inhalation capsule Place 1 capsule (18 mcg total) into inhaler and inhale daily. (Patient not taking: Reported on 05/21/2018) 30 capsule 0  . traZODone (DESYREL) 50 MG tablet Take 1 tablet (50 mg total) by mouth at bedtime as needed for sleep. (Patient not taking: Reported on 05/21/2018) 30 tablet 0   PTA Medications:  (Not in a hospital admission)  Musculoskeletal: Strength & Muscle Tone: within normal limits Gait & Station: UTA since patient is lying in bed. Patient leans: N/A  Psychiatric Specialty Exam: Physical Exam  Nursing note and vitals reviewed. Constitutional: He is oriented to person, place, and time. He appears well-developed and well-nourished.  HENT:  Head: Normocephalic and atraumatic.  Neck: Normal range of motion.  Respiratory: Effort normal.  Musculoskeletal: Normal range of motion.  Neurological: He is alert and oriented to person, place, and time.  Psychiatric: His speech is normal and behavior is  normal. Judgment  and thought content normal. Cognition and memory are normal. He exhibits a depressed mood.    Review of Systems  Psychiatric/Behavioral: Positive for depression, hallucinations (AH due to withdrawal) and substance abuse. Negative for suicidal ideas.  All other systems reviewed and are negative.   Blood pressure 136/84, pulse 98, temperature 98.8 F (37.1 C), temperature source Oral, resp. rate 18, height 5\' 5"  (1.651 m), weight 65.8 kg, SpO2 98 %.Body mass index is 24.13 kg/m.  General Appearance: Disheveled, malodorous, middle aged, Caucasian male, wearing paper hospital scrubs and lying in bed. NAD.   Eye Contact:  Good  Speech:  Clear and Coherent and Normal Rate  Volume:  Normal  Mood:  Depressed  Affect:  Constricted  Thought Process:  Goal Directed, Linear and Descriptions of Associations: Intact  Orientation:  Full (Time, Place, and Person)  Thought Content:  Logical   Suicidal Thoughts:  No  Homicidal Thoughts:  No  Memory:  Immediate;   Good Recent;   Good Remote;   Good  Judgement:  Fair  Insight:  Fair  Psychomotor Activity:  Normal but intermittently tremulous.   Concentration:  Concentration: Good and Attention Span: Good  Recall:  Good  Fund of Knowledge:  Good  Language:  Good  Akathisia:  No  Handed:  Right  AIMS (if indicated):   N/A  Assets:  Communication Skills Desire for Improvement Financial Resources/Insurance Social Support  ADL's:  Intact  Cognition:  WNL  Sleep:   N/A     Demographic Factors:  Male, Divorced or widowed, Caucasian, Low socioeconomic status and Unemployed  Loss Factors: Financial problems/change in socioeconomic status  Historical Factors: Prior suicide attempts and Impulsivity  Risk Reduction Factors:   Positive social support  Continued Clinical Symptoms:  Depression:   Impulsivity Alcohol/Substance Abuse/Dependencies  Cognitive Features That Contribute To Risk:  None    Suicide Risk:  Minimal: No identifiable  suicidal ideation.  Patients presenting with no risk factors but with morbid ruminations; may be classified as minimal risk based on the severity of the depressive symptoms    Plan Of Care/Follow-up recommendations:  -Restart Wellbutrin XL 150 mg daily for mood.  -Restart Gabapentin 300 mg TID for alcohol use.  -Continue follow up with your outpatient provider. -Patient provided with peer support resources for substance abuse treatment. Social work also provided resources.   Disposition: Discharge home.  Faythe Dingwall, DO 05/22/2018, 12:07 PM

## 2018-05-22 NOTE — ED Notes (Signed)
Patient has been calm and cooperative during this shift.

## 2018-05-22 NOTE — Discharge Instructions (Signed)
-  Follow up with your outpatient provider for medication management. -Take your medications as prescribed.  -Please use the resources provided to seek treatment for alcohol abuse.

## 2018-05-24 ENCOUNTER — Encounter (HOSPITAL_COMMUNITY): Payer: Self-pay | Admitting: *Deleted

## 2018-05-24 ENCOUNTER — Emergency Department (HOSPITAL_COMMUNITY)
Admission: EM | Admit: 2018-05-24 | Discharge: 2018-05-26 | Disposition: A | Discharge status: Home / Self Care | Payer: Medicaid Other | Attending: Emergency Medicine | Admitting: Emergency Medicine

## 2018-05-24 ENCOUNTER — Other Ambulatory Visit: Payer: Self-pay

## 2018-05-24 DIAGNOSIS — F331 Major depressive disorder, recurrent, moderate: Secondary | ICD-10-CM | POA: Diagnosis present

## 2018-05-24 DIAGNOSIS — Z79899 Other long term (current) drug therapy: Secondary | ICD-10-CM | POA: Insufficient documentation

## 2018-05-24 DIAGNOSIS — R45851 Suicidal ideations: Secondary | ICD-10-CM | POA: Insufficient documentation

## 2018-05-24 DIAGNOSIS — F10920 Alcohol use, unspecified with intoxication, uncomplicated: Secondary | ICD-10-CM | POA: Diagnosis not present

## 2018-05-24 DIAGNOSIS — F333 Major depressive disorder, recurrent, severe with psychotic symptoms: Secondary | ICD-10-CM | POA: Insufficient documentation

## 2018-05-24 DIAGNOSIS — I10 Essential (primary) hypertension: Secondary | ICD-10-CM | POA: Diagnosis not present

## 2018-05-24 DIAGNOSIS — J449 Chronic obstructive pulmonary disease, unspecified: Secondary | ICD-10-CM | POA: Insufficient documentation

## 2018-05-24 DIAGNOSIS — F17210 Nicotine dependence, cigarettes, uncomplicated: Secondary | ICD-10-CM | POA: Insufficient documentation

## 2018-05-24 DIAGNOSIS — I2510 Atherosclerotic heart disease of native coronary artery without angina pectoris: Secondary | ICD-10-CM | POA: Insufficient documentation

## 2018-05-24 LAB — CBC
HCT: 36.5 % — ABNORMAL LOW (ref 39.0–52.0)
Hemoglobin: 11.3 g/dL — ABNORMAL LOW (ref 13.0–17.0)
MCH: 24.2 pg — ABNORMAL LOW (ref 26.0–34.0)
MCHC: 31 g/dL (ref 30.0–36.0)
MCV: 78.3 fL — ABNORMAL LOW (ref 80.0–100.0)
Platelets: 348 10*3/uL (ref 150–400)
RBC: 4.66 MIL/uL (ref 4.22–5.81)
RDW: 21 % — ABNORMAL HIGH (ref 11.5–15.5)
WBC: 7.8 10*3/uL (ref 4.0–10.5)
nRBC: 0 % (ref 0.0–0.2)

## 2018-05-24 LAB — SALICYLATE LEVEL: Salicylate Lvl: <7 mg/dL (ref 2.8–30.0)

## 2018-05-24 LAB — COMPREHENSIVE METABOLIC PANEL
ALT: 22 U/L (ref 0–44)
AST: 31 U/L (ref 15–41)
Albumin: 3.6 g/dL (ref 3.5–5.0)
Alkaline Phosphatase: 62 U/L (ref 38–126)
Anion gap: 9 (ref 5–15)
BUN: 8 mg/dL (ref 8–23)
CO2: 24 mmol/L (ref 22–32)
Calcium: 7.9 mg/dL — ABNORMAL LOW (ref 8.9–10.3)
Chloride: 107 mmol/L (ref 98–111)
Creatinine, Ser: 0.75 mg/dL (ref 0.61–1.24)
GFR calc Af Amer: >60 mL/min (ref >60)
GFR calc non Af Amer: >60 mL/min (ref >60)
Glucose, Bld: 88 mg/dL (ref 70–99)
Potassium: 3.7 mmol/L (ref 3.5–5.1)
Sodium: 140 mmol/L (ref 135–145)
Total Bilirubin: 0.4 mg/dL (ref 0.3–1.2)
Total Protein: 6.8 g/dL (ref 6.5–8.1)

## 2018-05-24 LAB — ACETAMINOPHEN LEVEL: Acetaminophen (Tylenol), Serum: <10 ug/mL — ABNORMAL LOW (ref 10–30)

## 2018-05-24 LAB — ETHANOL: Alcohol, Ethyl (B): 337 mg/dL (ref <10)

## 2018-05-24 MED ORDER — VITAMIN B-1 100 MG PO TABS
100.0000 mg | ORAL_TABLET | Freq: Every day | ORAL | Status: DC
  Administered 2018-05-25 – 2018-05-26 (×2): 100 mg via ORAL
  Filled 2018-05-24 (×2): qty 1

## 2018-05-24 MED ORDER — BUPROPION HCL ER (XL) 150 MG PO TB24
150.0000 mg | ORAL_TABLET | Freq: Every day | ORAL | Status: DC
  Administered 2018-05-25 – 2018-05-26 (×2): 150 mg via ORAL
  Filled 2018-05-24 (×2): qty 1

## 2018-05-24 MED ORDER — GABAPENTIN 300 MG PO CAPS
300.0000 mg | ORAL_CAPSULE | Freq: Three times a day (TID) | ORAL | Status: DC
  Administered 2018-05-25 – 2018-05-26 (×5): 300 mg via ORAL
  Filled 2018-05-24 (×5): qty 1

## 2018-05-24 MED ORDER — LORAZEPAM 2 MG/ML IJ SOLN: 0.0000 mg | Freq: Two times a day (BID) | INTRAMUSCULAR | Status: DC

## 2018-05-24 MED ORDER — LORAZEPAM 1 MG PO TABS: 0.0000 mg | ORAL_TABLET | Freq: Two times a day (BID) | ORAL | Status: DC

## 2018-05-24 MED ORDER — LORAZEPAM 2 MG/ML IJ SOLN: 0.0000 mg | Freq: Four times a day (QID) | INTRAMUSCULAR | Status: DC

## 2018-05-24 MED ORDER — NICOTINE 21 MG/24HR TD PT24
21.0000 mg | MEDICATED_PATCH | Freq: Every day | TRANSDERMAL | Status: DC
  Administered 2018-05-25 – 2018-05-26 (×2): 21 mg via TRANSDERMAL
  Filled 2018-05-24 (×2): qty 1

## 2018-05-24 MED ORDER — TRAZODONE HCL 50 MG PO TABS: 50.0000 mg | ORAL_TABLET | Freq: Every evening | ORAL | Status: DC | PRN

## 2018-05-24 MED ORDER — LORAZEPAM 1 MG PO TABS
0.0000 mg | ORAL_TABLET | Freq: Four times a day (QID) | ORAL | Status: DC
  Administered 2018-05-25 – 2018-05-26 (×4): 1 mg via ORAL
  Filled 2018-05-24 (×4): qty 1

## 2018-05-24 MED ORDER — ONDANSETRON HCL 4 MG PO TABS: 4.0000 mg | ORAL_TABLET | Freq: Three times a day (TID) | ORAL | Status: DC | PRN

## 2018-05-24 MED ORDER — THIAMINE HCL 100 MG/ML IJ SOLN: 100.0000 mg | Freq: Every day | INTRAMUSCULAR | Status: DC

## 2018-05-24 NOTE — ED Notes (Signed)
Dr. Wilson Singer informed of ETOH level or 337.

## 2018-05-24 NOTE — ED Triage Notes (Signed)
Pt refused VS by [facility].  Pt is intoxicated & reports suicidal.
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
## 2018-05-24 NOTE — ED Notes (Signed)
Bed: WA27 Expected date: 05/24/18 Expected time: 7:02 PM Means of arrival: Ambulance Comments: ETOH, SI

## 2018-05-25 LAB — RAPID URINE DRUG SCREEN, HOSP PERFORMED
Amphetamines: NOT DETECTED
Barbiturates: NOT DETECTED
Benzodiazepines: NOT DETECTED
Cocaine: NOT DETECTED
Opiates: NOT DETECTED
Tetrahydrocannabinol: NOT DETECTED

## 2018-05-25 MED ORDER — LIDOCAINE VISCOUS HCL 2 % MT SOLN
15.0000 mL | Freq: Once | OROMUCOSAL | Status: AC
  Administered 2018-05-25: 15 mL via ORAL
  Filled 2018-05-25 (×2): qty 15

## 2018-05-25 MED ORDER — ACETAMINOPHEN 500 MG PO TABS
1000.0000 mg | ORAL_TABLET | Freq: Three times a day (TID) | ORAL | Status: DC | PRN
  Administered 2018-05-25 – 2018-05-26 (×2): 1000 mg via ORAL
  Filled 2018-05-25 (×2): qty 2

## 2018-05-25 MED ORDER — ALUM & MAG HYDROXIDE-SIMETH 200-200-20 MG/5ML PO SUSP
30.0000 mL | Freq: Once | ORAL | Status: AC
  Administered 2018-05-25: 30 mL via ORAL
  Filled 2018-05-25: qty 30

## 2018-05-25 NOTE — Patient Outreach (Signed)
[facility]PSS met with the patient to provide substance use recovery support and help with recovery resources. CPSS met with the patient on 05/22/18 and provided substance use recovery resources to follow up with. Patient is interested in gaining access to a detox bed for his alcohol use and other dual diagnosis needs. CPSS helped patient with referrals to [facility], [facility], and [facility]. Currently, [facility] and [facility] are at capacity today 05/25/18. CPSS has not heard back from [facility] yet regarding status of referral. Patient will need transportation by [facility] if accepted. CPSS will follow up with the patient tomorrow 05/26/18 to provide further substance use recovery support and help with recovery resources.

## 2018-05-25 NOTE — BH Assessment (Addendum)
Assessment Note  Samuel Reyes is an 62 y.o. male who presents to the ED voluntarily. Pt reports he has been depressed and suicidal. Pt states he laid in the middle of the road today in a suicide attempt and wanted a car to run him over. Pt states he is tired of being homeless. Pt reports he sleeps in a park and abuses alcohol daily. Pt states he has attempted suicide in the past about 4-5 years ago after his wife passed away. Pt reports he was receiving disability benefits however they were taken away and now he does not have any income. Pt states he has been experiencing VH and his most recent hallucination occurred yesterday when he saw a pack of dogs chasing him. Pt reports feelings of worthlessness and states he has nothing to live for. Pt is unable to contract for safety and states if he is d/c he will try to kill himself.   Per Patriciaann Clan, PA pt is recommended for continued observation for safety and stabilization and to be reassessed in the AM by psych. EDP Dr. Florina Ou, MD and pt's nurse Reagan, Audry Riles, RN have been advised.  Diagnosis: MDD, recurrent, severe, w/ psychosis; Alcohol use disorder, severe   Past Medical History:  Past Medical History:  Diagnosis Date  . Alcoholism ([place])   . CAD (coronary artery disease) 2002   MI, no intervention required  . COPD (chronic obstructive pulmonary disease) (HCC)    not on home O2  . Depression   . GERD (gastroesophageal reflux disease)   . Headache   . Hypertension   . Stroke [facility]) 2006    Past Surgical History:  Procedure Laterality Date  . BACK SURGERY     3 cervical spine surgeries C4-C5 fused  . COLONOSCOPY N/A 01/04/2014   Procedure: COLONOSCOPY;  Surgeon: Danie Binder, MD;  [facility];  Service: Endoscopy;  Laterality: N/A;  1:45  . ESOPHAGOGASTRODUODENOSCOPY N/A 01/04/2014   Procedure: ESOPHAGOGASTRODUODENOSCOPY (EGD);  Surgeon: Danie Binder, MD;  Location: [facility];  Service: Endoscopy;  Laterality: N/A;   . FINGER SURGERY Left    2nd, 3rd, & 4th fingers were cut off by table saw and reattached  . GASTRECTOMY    . HERNIA REPAIR    . INCISIONAL HERNIA REPAIR N/A 01/20/2014   Procedure: LAPAROSCOPIC RECURRENT  INCISIONAL HERNIA with mesh;  Surgeon: Edward Jolly, MD;  Location: [facility];  Service: General;  Laterality: N/A;  . rt knee arthroscopic surgery    . SHOULDER SURGERY Bilateral    3 surgeries on on left, 2 surgeries on right     Family History:  Family History  Problem Relation Age of Onset  . Cancer Father        bone  . Cancer Brother        lungs  . Stroke Maternal Grandmother   . Asthma Son        died at age 65 in his sleep   . Spina bifida Son        died at age 8   . Dementia Mother   . Colon cancer Neg Hx     Social History:  reports that he has been smoking cigarettes. He started smoking about 51 years ago. He has a 104.00 pack-year smoking history. He has never used smokeless tobacco. He reports that he drinks about 3.0 - 4.0 standard drinks of alcohol per week. He reports that he does not use drugs.  Additional Social  History:  Alcohol / Drug Use Pain Medications: See MAR Prescriptions: See MAR Over the Counter: See MAR History of alcohol / drug use?: Yes Longest period of sobriety (when/how long): 7-8 months Negative Consequences of Use: Financial, Personal relationships Withdrawal Symptoms: Tremors, Agitation Substance #1 Name of Substance 1: alcohol 1 - Age of First Use: 17 1 - Amount (size/oz): 4-5 40oz beers 1 - Frequency: daily 1 - Duration: ongoing 1 - Last Use / Amount: 05/24/18  CIWA: CIWA-Ar BP: 102/64 Pulse Rate: 95 Nausea and Vomiting: no nausea and no vomiting Tactile Disturbances: none Tremor: not visible, but can be felt fingertip to fingertip Auditory Disturbances: not present Paroxysmal Sweats: no sweat visible Visual Disturbances: not present Anxiety: no anxiety, at ease Headache, Fullness in Head: moderately  severe Agitation: normal activity Orientation and Clouding of Sensorium: oriented and can do serial additions CIWA-Ar Total: 5 COWS:    Allergies:  Allergies  Allergen Reactions  . Bee Venom Anaphylaxis  . Penicillins Rash    Has patient had a PCN reaction causing immediate rash, facial/tongue/throat swelling, SOB or lightheadedness with hypotension: {Yes Has patient had a PCN reaction causing severe rash involving mucus membranes or skin necrosis: YES Has patient had a PCN reaction that required hospitalization Yes Has patient had a PCN reaction occurring within the last 10 years: YES If all of the above answers are "NO", then may proceed with Cephalosporin use.     Home Medications:  (Not in a hospital admission)  OB/GYN Status:  No LMP for male patient.  General Assessment Data [facility] of Assessment: [facility] TTS Assessment: In system Is this a Tele or Face-to-Face Assessment?: Face-to-Face Is this an Initial Assessment or a Re-assessment for this encounter?: Initial Assessment Patient Accompanied by:: (alone) Language Other than English: No Living Arrangements: Homeless/Shelter What gender do you identify as?: Male Marital status: Widowed Pregnancy Status: No Living Arrangements: Alone Can pt return to current living arrangement?: Yes Admission Status: Voluntary Is patient capable of signing voluntary admission?: Yes Referral Source: Self/Family/Friend Insurance type: Medicaid     Crisis Care Plan Living Arrangements: Alone Name of Psychiatrist: none Name of Therapist: none  Education Status Is patient currently in school?: No Is the patient employed, unemployed or receiving disability?: Unemployed  Risk to self with the past 6 months Suicidal Ideation: Yes-Currently Present Has patient been a risk to self within the past 6 months prior to admission? : Yes Suicidal Intent: Yes-Currently Present Has patient had any suicidal intent within the past 6 months  prior to admission? : Yes Is patient at risk for suicide?: Yes Suicidal Plan?: Yes-Currently Present Has patient had any suicidal plan within the past 6 months prior to admission? : Yes Specify Current Suicidal Plan: pt reports he laid in the middle of the road in an attempt to get hit by a car Access to Means: Yes Specify Access to Suicidal Means: pt has access to traffic  What has been your use of drugs/alcohol within the last 12 months?: daily alcohol use  Previous Attempts/Gestures: Yes How many times?: 1 Other Self Harm Risks: hx of suicide attempts, substance abuse, lack of resources  Triggers for Past Attempts: Spouse contact Intentional Self Injurious Behavior: None Family Suicide History: No Recent stressful life event(s): Loss (Comment), Financial Problems, Turmoil (Comment)(homeless) Persecutory voices/beliefs?: No Depression: Yes Depression Symptoms: Despondent, Insomnia, Tearfulness, Isolating, Fatigue, Guilt, Loss of interest in usual pleasures, Feeling worthless/self pity, Feeling angry/irritable Substance abuse history and/or treatment for substance abuse?: Yes Suicide prevention  information given to non-admitted patients: Not applicable  Risk to Others within the past 6 months Homicidal Ideation: No Does patient have any lifetime risk of violence toward others beyond the six months prior to admission? : No Thoughts of Harm to Others: No Current Homicidal Intent: No Current Homicidal Plan: No Access to Homicidal Means: No History of harm to others?: No Assessment of Violence: None Noted Does patient have access to weapons?: No Criminal Charges Pending?: No Does patient have a court date: No Is patient on probation?: No  Psychosis Hallucinations: Auditory Delusions: None noted  Mental Status Report Appearance/Hygiene: In scrubs, Unremarkable Eye Contact: Poor Motor Activity: Freedom of movement Speech: Slurred Level of Consciousness: Drowsy Mood: Depressed,  Sad, Sullen, Worthless, low self-esteem, Irritable Affect: Depressed, Flat, Irritable Anxiety Level: None Thought Processes: Relevant, Coherent Judgement: Impaired Orientation: Person, Place, Time, Situation, Appropriate for developmental age Obsessive Compulsive Thoughts/Behaviors: None  Cognitive Functioning Concentration: Normal Memory: Remote Intact, Recent Intact Is patient IDD: No Insight: Poor Impulse Control: Poor Appetite: Good Have you had any weight changes? : No Change Sleep: Decreased Total Hours of Sleep: 3 Vegetative Symptoms: None  ADLScreening [facility] Assessment Services) Patient's cognitive ability adequate to safely complete daily activities?: Yes Patient able to express need for assistance with ADLs?: Yes Independently performs ADLs?: Yes (appropriate for developmental age)  Prior Inpatient Therapy Prior Inpatient Therapy: Yes Prior Therapy Dates: 2019, 2017, 2016 Prior Therapy Facilty/Provider(s): [facility] Reason for Treatment: ALCOHOL DEPENDENCE, MDD  Prior Outpatient Therapy Prior Outpatient Therapy: Yes Prior Therapy Dates: 2018 Prior Therapy Facilty/Provider(s): [facility] Reason for Treatment: MED MANAGEMENT Does patient have an ACCT team?: No Does patient have Intensive In-House Services?  : No Does patient have [facility] services? : No Does patient have P4CC services?: No  ADL Screening (condition at time of admission) Patient's cognitive ability adequate to safely complete daily activities?: Yes Is the patient deaf or have difficulty hearing?: No Does the patient have difficulty seeing, even when wearing glasses/contacts?: No Does the patient have difficulty concentrating, remembering, or making decisions?: No Patient able to express need for assistance with ADLs?: Yes Does the patient have difficulty dressing or bathing?: No Independently performs ADLs?: Yes (appropriate for developmental age) Does the patient have difficulty walking or climbing  stairs?: No Weakness of Legs: None Weakness of Arms/Hands: None  Home Assistive Devices/Equipment Home Assistive Devices/Equipment: None    Abuse/Neglect Assessment (Assessment to be complete while patient is alone) Abuse/Neglect Assessment Can Be Completed: Yes Physical Abuse: Denies Verbal Abuse: Denies Sexual Abuse: Denies Exploitation of patient/patient's resources: Denies Self-Neglect: Denies     Regulatory affairs officer (For Healthcare) Does Patient Have a Medical Advance Directive?: No Would patient like information on creating a medical advance directive?: No - Patient declined Nutrition Screen- [place] [facility] Patient's home diet: Regular        Disposition: Per Patriciaann Clan, PA pt is recommended for continued observation for safety and stabilization and to be reassessed in the AM by psych. EDP Dr. Florina Ou, MD and pt's nurse Reagan, Audry Riles, RN have been advised.  Disposition Initial Assessment Completed for this Encounter: Yes Disposition of Patient: (overnight OBS pending AM psych assessment) Patient refused recommended treatment: No  On Site Evaluation by:   Reviewed with Physician:    Lyanne Co 05/25/2018 1:18 AM

## 2018-05-25 NOTE — Patient Outreach (Signed)
Patient was accepted and agreeable to go to [facility] for detox for his alcohol use and to help with other dual diagnosis needs. The name of the accepting physician is Dr. Kathlene Cote and the number to call for report is [phone]. Patient can arrive at anytime tomorrow morning 05/26/18 after 10:00 a.m. Patient will need transportation by [place].

## 2018-05-25 NOTE — ED Notes (Signed)
Calm and cooperative. Reports being tired of living, drinking alcohol and being homeless.

## 2018-05-25 NOTE — Progress Notes (Signed)
Per Patriciaann Clan, PA pt is recommended for continued observation for safety and stabilization and to be reassessed in the AM by psych. EDP Dr. Florina Ou, MD and pt's nurse Reagan, Audry Riles, RN have been advised.  Lind Covert, MSW, LCSW Therapeutic Triage Specialist  [phone]

## 2018-05-25 NOTE — Progress Notes (Signed)
CSW received call from [place] regarding patient request for clothing assistance. CSW accessed [facility] clothing closet. 2 shirts and one pair of sweatpants were selected for patient and given to patient's nurse. Clothing is in patient's locker and will be given to patient at discharge tomorrow morning.  CSW signing off.  Stephanie Acre, [place] Social Worker [phone]

## 2018-05-26 NOTE — ED Notes (Signed)
OLD Vertis Kelch [phone] BETTY  RN

## 2018-05-26 NOTE — ED Notes (Signed)
ED TO INPATIENT HANDOFF REPORT  Name/Age/Gender Samuel Reyes 62 y.o. male  Code Status    Code Status Orders  (From admission, onward)         Start     Ordered   05/24/18 2358  Full code  Continuous     05/24/18 2357        Code Status History    Date Active Date Inactive Code Status Order ID Comments User Context   05/21/2018 1719 05/22/2018 1704 Full Code [id number]  Franchot Heidelberg, PA-C ED   08/26/2017 1533 08/30/2017 1013 Full Code [id number]  Ethelene Hal, NP Inpatient   08/23/2017 2023 08/26/2017 1532 Full Code [id number]  Isla Pence, MD ED   08/08/2017 0107 08/09/2017 1900 Full Code [id number]  Etta Quill, DO ED   08/07/2017 1357 08/08/2017 0106 Full Code [id number]  Fransico Meadow, PA-C ED   08/07/2017 1016 08/07/2017 1357 Full Code [id number]  Fransico Meadow, PA-C ED   07/29/2017 2226 07/30/2017 1621 Full Code [id number]  Roney Jaffe, MD Inpatient   06/25/2017 0058 07/01/2017 2114 DNR [id number]  Karmen Bongo, MD Inpatient   05/17/2017 1334 05/17/2017 2251 Full Code [id number]  Recardo Evangelist, PA-C ED   08/21/2016 2053 08/27/2016 1729 Full Code [id number]  Etta Quill, DO ED   08/02/2016 1803 08/06/2016 1523 Full Code [id number]  Orson Eva, MD ED   12/12/2015 1556 12/13/2015 1700 Full Code [id number]  Patrecia Pour, NP Inpatient   12/11/2015 1644 12/12/2015 1556 Full Code [id number]  Larene Pickett, PA-C ED   11/15/2015 2257 11/17/2015 1826 Full Code [id number]  Ivor Costa, MD ED   10/29/2015 1401 11/07/2015 1648 Full Code [id number]  Encarnacion Slates, NP Inpatient   10/28/2015 1749 10/29/2015 1401 Full Code [id number]  Bjorn Loser, RN ED   10/28/2015 1354 10/28/2015 1749 Full Code [id number]  Dowless, Dondra Spry, PA-C ED   09/17/2015 1844 09/22/2015 0006 Full Code [id number]  Robbie Lis, MD Inpatient   09/02/2015 1652 09/07/2015 1950 Full Code [id number]  Samella Parr, NP Inpatient   09/02/2015 0959 09/02/2015 1652 Full Code [id number]  Jarome Matin, MD ED   06/01/2015  0421 06/01/2015 1148 Full Code [id number]  Little, Wenda Overland, MD ED   03/14/2015 1505 03/18/2015 1729 Full Code [id number]  Patrecia Pour, NP Inpatient   03/13/2015 2244 03/14/2015 1505 Full Code [id number]  Nat Christen, MD ED   02/05/2015 1604 02/07/2015 1407 Full Code [id number]  Annita Brod, MD Inpatient   01/18/2015 2020 01/22/2015 1653 Full Code [id number]  Allyne Gee, MD Inpatient   01/05/2015 0331 01/06/2015 2035 Full Code [id number]  Lavina Hamman, MD ED   01/04/2015 1634 01/05/2015 0331 Full Code [id number]  Vickii Chafe, MD ED   08/31/2014 0818 09/05/2014 1832 DNR [id number]  Melton Alar, PA-C Inpatient   08/31/2014 0744 08/31/2014 0818 Full Code [id number]  Karen Kitchens Inpatient   08/03/2014 1715 08/09/2014 0918 Full Code [id number]  Nicholaus Bloom, MD Inpatient   08/01/2014 1511 08/03/2014 1715 Full Code [id number]  Benjamine Mola, [place] Inpatient   08/01/2014 1059 08/01/2014 1511 Full Code [id number]  Virgel Manifold, MD ED   07/21/2014 0522 07/27/2014 1647 Full Code [id number]  Rise Patience, MD Inpatient   07/20/2014 2117 07/21/2014 0111 Full Code [id number]  Evelina Bucy, MD ED   07/20/2014 1656 07/20/2014 2117 Full Code [id number]  Evelina Bucy, MD ED   03/09/2014 0435 03/12/2014 2043 Full Code [id number]  Rennie Plowman Inpatient   03/08/2014 1614 03/09/2014 0435 Full Code [id number]  Mirna Mires, MD ED   01/20/2014 1213 01/22/2014 1532 Full Code [id number]  Excell Seltzer, MD Inpatient   01/29/2013 1542 01/30/2013 2145 Full Code [id number]  Doy Hutching ED      Home/SNF/Other Home  Chief Complaint suicidal  Level of Care/Admitting Diagnosis ED Disposition    ED Disposition Condition Comment   Transfer to Another Facility  Transfer to [facility] Accepting is Dr. Kathlene Cote.       Medical History Past Medical History:  Diagnosis Date  . Alcoholism ([place])   . CAD (coronary artery disease) 2002   MI, no intervention required  . COPD (chronic obstructive  pulmonary disease) (HCC)    not on home O2  . Depression   . GERD (gastroesophageal reflux disease)   . Headache   . Hypertension   . Stroke [facility]) 2006    Allergies Allergies  Allergen Reactions  . Bee Venom Anaphylaxis  . Penicillins Rash    Has patient had a PCN reaction causing immediate rash, facial/tongue/throat swelling, SOB or lightheadedness with hypotension: {Yes Has patient had a PCN reaction causing severe rash involving mucus membranes or skin necrosis: YES Has patient had a PCN reaction that required hospitalization Yes Has patient had a PCN reaction occurring within the last 10 years: YES If all of the above answers are "NO", then may proceed with Cephalosporin use.     IV Location/Drains/Wounds Patient Lines/Drains/Airways Status   Active Line/Drains/Airways    None          Labs/Imaging Results for orders placed or performed during the hospital encounter of 05/24/18 (from the past 48 hour(s))  Comprehensive metabolic panel     Status: Abnormal   Collection Time: 05/24/18  8:57 PM  Result Value Ref Range   Sodium 140 135 - 145 mmol/L   Potassium 3.7 3.5 - 5.1 mmol/L   Chloride 107 98 - 111 mmol/L   CO2 24 22 - 32 mmol/L   Glucose, Bld 88 70 - 99 mg/dL   BUN 8 8 - 23 mg/dL   Creatinine, Ser 0.75 0.61 - 1.24 mg/dL   Calcium 7.9 (L) 8.9 - 10.3 mg/dL   Total Protein 6.8 6.5 - 8.1 g/dL   Albumin 3.6 3.5 - 5.0 g/dL   AST 31 15 - 41 U/L   ALT 22 0 - 44 U/L   Alkaline Phosphatase 62 38 - 126 U/L   Total Bilirubin 0.4 0.3 - 1.2 mg/dL   GFR calc non Af Amer >60 >60 mL/min   GFR calc Af Amer >60 >60 mL/min    Comment: (NOTE) The eGFR has been calculated using the CKD EPI equation. This calculation has not been validated in all clinical situations. eGFR's persistently <60 mL/min signify possible Chronic Kidney Disease.    Anion gap 9 5 - 15    Comment: Performed at [facility], [place] [address], [place], [place] [address]  Ethanol      Status: Abnormal   Collection Time: 05/24/18  8:57 PM  Result Value Ref Range   Alcohol, Ethyl (B) 337 (HH) <10 mg/dL    Comment: CRITICAL RESULT CALLED TO, READ BACK BY AND VERIFIED WITH: E,RBAGAN AT 2230 ON 05/24/18 BY A,MOHAMED (NOTE) Lowest detectable limit for serum alcohol is 10 mg/dL. For medical purposes only. Performed at [facility], [place] [address] [place], [place] [address]   Salicylate level  Status: None   Collection Time: 05/24/18  8:57 PM  Result Value Ref Range   Salicylate Lvl <2.2 2.8 - 30.0 mg/dL    Comment: Performed at [facility], [place] [address] [place], [place] [address]  Acetaminophen level     Status: Abnormal   Collection Time: 05/24/18  8:57 PM  Result Value Ref Range   Acetaminophen (Tylenol), Serum <10 (L) 10 - 30 ug/mL    Comment: (NOTE) Therapeutic concentrations vary significantly. A range of 10-30 ug/mL  may be an effective concentration for many patients. However, some  are best treated at concentrations outside of this range. Acetaminophen concentrations >150 ug/mL at 4 hours after ingestion  and >50 ug/mL at 12 hours after ingestion are often associated with  toxic reactions. Performed at [facility], [place] [address] [place], [place] [address]   cbc     Status: Abnormal   Collection Time: 05/24/18  8:57 PM  Result Value Ref Range   WBC 7.8 4.0 - 10.5 K/uL   RBC 4.66 4.22 - 5.81 MIL/uL   Hemoglobin 11.3 (L) 13.0 - 17.0 g/dL   HCT 36.5 (L) 39.0 - 52.0 %   MCV 78.3 (L) 80.0 - 100.0 fL   MCH 24.2 (L) 26.0 - 34.0 pg   MCHC 31.0 30.0 - 36.0 g/dL   RDW 21.0 (H) 11.5 - 15.5 %   Platelets 348 150 - 400 K/uL   nRBC 0.0 0.0 - 0.2 %    Comment: Performed at [facility], [place] [address] [place], [place] [address]  Rapid urine drug screen (hospital performed)     Status: None   Collection Time: 05/24/18  8:57 PM  Result Value Ref Range   Opiates NONE DETECTED NONE  DETECTED   Cocaine NONE DETECTED NONE DETECTED   Benzodiazepines NONE DETECTED NONE DETECTED   Amphetamines NONE DETECTED NONE DETECTED   Tetrahydrocannabinol NONE DETECTED NONE DETECTED   Barbiturates NONE DETECTED NONE DETECTED    Comment: (NOTE) DRUG SCREEN FOR MEDICAL PURPOSES ONLY.  IF CONFIRMATION IS NEEDED FOR ANY PURPOSE, NOTIFY LAB WITHIN 5 DAYS. LOWEST DETECTABLE LIMITS FOR URINE DRUG SCREEN Drug Class                     Cutoff (ng/mL) Amphetamine and metabolites    1000 Barbiturate and metabolites    200 Benzodiazepine                 694 Tricyclics and metabolites     300 Opiates and metabolites        300 Cocaine and metabolites        300 THC                            50 Performed at [facility], [place] [address], [place], [place] [address]    No results found. None  Pending Labs Unresulted Labs (From admission, onward)   None      Vitals/Pain Today's Vitals   05/25/18 2335 05/26/18 0650 05/26/18 0812 05/26/18 1029  BP: 138/78 (!) 144/89    Pulse: 90 83    Resp: 18 16    Temp: 98.3 F (36.8 C) 98.2 F (36.8 C)    TempSrc: Oral Oral    SpO2: 99% 97%    Weight:      Height:      PainSc:   0-No pain 2  Isolation Precautions No active isolations  Medications Medications  nicotine (NICODERM CQ - dosed in mg/24 hours) patch 21 mg (21 mg Transdermal Patch Applied 05/26/18 0938)  ondansetron (ZOFRAN) tablet 4 mg (has no administration in time range)  LORazepam (ATIVAN) injection 0-4 mg ( Intravenous See Alternative 05/26/18 0644)    Or  LORazepam (ATIVAN) tablet 0-4 mg (0 mg Oral Not Given 05/26/18 0644)  LORazepam (ATIVAN) injection 0-4 mg (has no administration in time range)    Or  LORazepam (ATIVAN) tablet 0-4 mg (has no administration in time range)  thiamine (VITAMIN B-1) tablet 100 mg (100 mg Oral Given 05/26/18 0938)    Or  thiamine (B-1) injection 100 mg ( Intravenous See Alternative 05/26/18 0938)  buPROPion  (WELLBUTRIN XL) 24 hr tablet 150 mg (150 mg Oral Given 05/26/18 0938)  gabapentin (NEURONTIN) capsule 300 mg (300 mg Oral Given 05/26/18 0938)  traZODone (DESYREL) tablet 50 mg (has no administration in time range)  acetaminophen (TYLENOL) tablet 1,000 mg (1,000 mg Oral Given 05/26/18 0943)  alum & mag hydroxide-simeth (MAALOX/MYLANTA) 200-200-20 MG/5ML suspension 30 mL (30 mLs Oral Given 05/25/18 1238)    And  lidocaine (XYLOCAINE) 2 % viscous mouth solution 15 mL (15 mLs Oral Given 05/25/18 1238)    Mobility walks

## 2018-07-17 ENCOUNTER — Other Ambulatory Visit: Payer: Self-pay

## 2018-07-17 ENCOUNTER — Emergency Department (HOSPITAL_COMMUNITY)
Admission: EM | Admit: 2018-07-17 | Discharge: 2018-07-18 | Disposition: A | Discharge status: Other Acute Inpatient Hospital | Payer: Medicaid Other | Attending: Emergency Medicine | Admitting: Emergency Medicine

## 2018-07-17 ENCOUNTER — Emergency Department (HOSPITAL_COMMUNITY): Payer: Medicaid Other

## 2018-07-17 ENCOUNTER — Encounter (HOSPITAL_COMMUNITY): Payer: Self-pay | Admitting: Emergency Medicine

## 2018-07-17 DIAGNOSIS — R45851 Suicidal ideations: Secondary | ICD-10-CM | POA: Insufficient documentation

## 2018-07-17 DIAGNOSIS — I2510 Atherosclerotic heart disease of native coronary artery without angina pectoris: Secondary | ICD-10-CM | POA: Diagnosis not present

## 2018-07-17 DIAGNOSIS — F329 Major depressive disorder, single episode, unspecified: Secondary | ICD-10-CM | POA: Insufficient documentation

## 2018-07-17 DIAGNOSIS — F10920 Alcohol use, unspecified with intoxication, uncomplicated: Secondary | ICD-10-CM | POA: Insufficient documentation

## 2018-07-17 DIAGNOSIS — R4182 Altered mental status, unspecified: Secondary | ICD-10-CM | POA: Diagnosis present

## 2018-07-17 DIAGNOSIS — Z79899 Other long term (current) drug therapy: Secondary | ICD-10-CM | POA: Insufficient documentation

## 2018-07-17 DIAGNOSIS — F17210 Nicotine dependence, cigarettes, uncomplicated: Secondary | ICD-10-CM | POA: Insufficient documentation

## 2018-07-17 DIAGNOSIS — J449 Chronic obstructive pulmonary disease, unspecified: Secondary | ICD-10-CM | POA: Insufficient documentation

## 2018-07-17 DIAGNOSIS — I10 Essential (primary) hypertension: Secondary | ICD-10-CM | POA: Insufficient documentation

## 2018-07-17 DIAGNOSIS — Z590 Homelessness: Secondary | ICD-10-CM | POA: Insufficient documentation

## 2018-07-17 DIAGNOSIS — Y908 Blood alcohol level of 240 mg/100 ml or more: Secondary | ICD-10-CM | POA: Insufficient documentation

## 2018-07-17 LAB — CBC WITH DIFFERENTIAL/PLATELET
Abs Immature Granulocytes: 0 10*3/uL (ref 0.00–0.07)
BASOS PCT: 1 %
Basophils Absolute: 0.1 10*3/uL (ref 0.0–0.1)
Eosinophils Absolute: 0 10*3/uL (ref 0.0–0.5)
Eosinophils Relative: 0 %
HEMATOCRIT: 48.3 % (ref 39.0–52.0)
Hemoglobin: 14.4 g/dL (ref 13.0–17.0)
Lymphocytes Relative: 23 %
Lymphs Abs: 2.5 10*3/uL (ref 0.7–4.0)
MCH: 26.8 pg (ref 26.0–34.0)
MCHC: 29.8 g/dL — ABNORMAL LOW (ref 30.0–36.0)
MCV: 89.8 fL (ref 80.0–100.0)
Monocytes Absolute: 0 10*3/uL — ABNORMAL LOW (ref 0.1–1.0)
Monocytes Relative: 0 %
Neutro Abs: 8.1 10*3/uL — ABNORMAL HIGH (ref 1.7–7.7)
Neutrophils Relative %: 76 %
Platelets: 337 10*3/uL (ref 150–400)
RBC: 5.38 MIL/uL (ref 4.22–5.81)
RDW: 23.9 % — ABNORMAL HIGH (ref 11.5–15.5)
WBC: 10.7 10*3/uL — ABNORMAL HIGH (ref 4.0–10.5)
nRBC: 0 % (ref 0.0–0.2)
nRBC: 0 /100 WBC

## 2018-07-17 LAB — I-STAT TROPONIN, ED: Troponin i, poc: 0.02 ng/mL (ref 0.00–0.08)

## 2018-07-17 LAB — COMPREHENSIVE METABOLIC PANEL
ALT: 21 U/L (ref 0–44)
AST: 42 U/L — ABNORMAL HIGH (ref 15–41)
Albumin: 4.4 g/dL (ref 3.5–5.0)
Alkaline Phosphatase: 63 U/L (ref 38–126)
Anion gap: 14 (ref 5–15)
BUN: 6 mg/dL — ABNORMAL LOW (ref 8–23)
CO2: 19 mmol/L — ABNORMAL LOW (ref 22–32)
CREATININE: 0.83 mg/dL (ref 0.61–1.24)
Calcium: 8.9 mg/dL (ref 8.9–10.3)
Chloride: 107 mmol/L (ref 98–111)
GFR calc Af Amer: >60 mL/min (ref >60)
GFR calc non Af Amer: >60 mL/min (ref >60)
Glucose, Bld: 91 mg/dL (ref 70–99)
Potassium: 4.9 mmol/L (ref 3.5–5.1)
Sodium: 140 mmol/L (ref 135–145)
Total Bilirubin: 0.8 mg/dL (ref 0.3–1.2)
Total Protein: 7.7 g/dL (ref 6.5–8.1)

## 2018-07-17 LAB — ACETAMINOPHEN LEVEL: Acetaminophen (Tylenol), Serum: <10 ug/mL — ABNORMAL LOW (ref 10–30)

## 2018-07-17 LAB — RAPID URINE DRUG SCREEN, HOSP PERFORMED
Amphetamines: NOT DETECTED
BENZODIAZEPINES: NOT DETECTED
Barbiturates: NOT DETECTED
COCAINE: NOT DETECTED
Opiates: NOT DETECTED
Tetrahydrocannabinol: NOT DETECTED

## 2018-07-17 LAB — ETHANOL: Alcohol, Ethyl (B): 352 mg/dL (ref <10)

## 2018-07-17 LAB — SALICYLATE LEVEL: Salicylate Lvl: <7 mg/dL (ref 2.8–30.0)

## 2018-07-17 LAB — CBG MONITORING, ED: Glucose-Capillary: 91 mg/dL (ref 70–99)

## 2018-07-17 MED ORDER — VITAMIN B-1 100 MG PO TABS: 100.0000 mg | ORAL_TABLET | Freq: Every day | ORAL | Status: DC

## 2018-07-17 MED ORDER — ONDANSETRON HCL 4 MG PO TABS: 4.0000 mg | ORAL_TABLET | Freq: Three times a day (TID) | ORAL | Status: DC | PRN

## 2018-07-17 MED ORDER — LORAZEPAM 2 MG/ML IJ SOLN: 0.0000 mg | Freq: Four times a day (QID) | INTRAMUSCULAR | Status: DC

## 2018-07-17 MED ORDER — LORAZEPAM 2 MG/ML IJ SOLN: 0.0000 mg | Freq: Two times a day (BID) | INTRAMUSCULAR | Status: DC

## 2018-07-17 MED ORDER — LORAZEPAM 1 MG PO TABS: 0.0000 mg | ORAL_TABLET | Freq: Two times a day (BID) | ORAL | Status: DC

## 2018-07-17 MED ORDER — ACETAMINOPHEN 325 MG PO TABS: 650.0000 mg | ORAL_TABLET | ORAL | Status: DC | PRN

## 2018-07-17 MED ORDER — NICOTINE 21 MG/24HR TD PT24: 21.0000 mg | MEDICATED_PATCH | Freq: Every day | TRANSDERMAL | Status: DC

## 2018-07-17 MED ORDER — THIAMINE HCL 100 MG/ML IJ SOLN: 100.0000 mg | Freq: Every day | INTRAMUSCULAR | Status: DC

## 2018-07-17 MED ORDER — ALUM & MAG HYDROXIDE-SIMETH 200-200-20 MG/5ML PO SUSP: 30.0000 mL | Freq: Four times a day (QID) | ORAL | Status: DC | PRN

## 2018-07-17 MED ORDER — LORAZEPAM 1 MG PO TABS
0.0000 mg | ORAL_TABLET | Freq: Four times a day (QID) | ORAL | Status: DC
  Administered 2018-07-18: 1 mg via ORAL
  Filled 2018-07-17: qty 1

## 2018-07-17 NOTE — ED Notes (Addendum)
Pt states he is unable to ambulate. He refuses to stand.

## 2018-07-17 NOTE — ED Provider Notes (Signed)
[facility] EMERGENCY DEPARTMENT Provider Note   CSN: [id number] Arrival date & time: 07/17/18  1133     History   Chief Complaint Chief Complaint  Patient presents with  . Loss of Consciousness  . Altered Mental Status    HPI Samuel Reyes is a 62 y.o. male with a history of alcohol use disorder, COPD, CAD, GERD, HTN, and stroke who presents to the emergency department by EMS from the [facility] with a chief complaint of altered mental status.  EMS reports that [facility] staff noted the patient to be unconscious while seated in a chair.  During the 30-minute observation time with EMS, the patient was initially unresponsive for <1 minute, but gradually began responsive to painful then voice.  EMS reports the patient was not following commands or answering questions.  On initial examination, the patient opens his eyes to voice. He attempts to follow commands, but continues to fall asleep.  He mumbles, but does not answer questions appropriately. GCS 11.   Per chart review, the patient has 4 previous ER visits since September for acute alcohol intoxication.  Level V caveat secondary to AMS.   The history is provided by the EMS personnel. No language interpreter was used.    Past Medical History:  Diagnosis Date  . Alcoholism ([place])   . CAD (coronary artery disease) 2002   MI, no intervention required  . COPD (chronic obstructive pulmonary disease) (HCC)    not on home O2  . Depression   . GERD (gastroesophageal reflux disease)   . Headache   . Hypertension   . Stroke [facility]) 2006    Patient Active Problem List   Diagnosis Date Noted  . MDD (major depressive disorder), recurrent severe, without psychosis ([place]) 08/26/2017  . Alcohol withdrawal ([place]) 08/08/2017  . COPD with acute bronchitis ([place]) 07/29/2017  . Alcohol use   . Homelessness 06/24/2017  . Lung nodule 09/03/2016  . Tobacco abuse 09/03/2016  . Insomnia 09/03/2016  . HCAP (healthcare-associated  pneumonia) 08/22/2016  . Bronchiolitis 08/21/2016  . Malnutrition of moderate degree 08/04/2016  . Atrial tachycardia ([place]) 08/02/2016  . Impaired glucose tolerance 08/02/2016  . Alcohol abuse with alcohol-induced mood disorder ([place]) 12/12/2015  . Syncope 11/15/2015  . Chest pain 11/15/2015  . Nausea vomiting and diarrhea 11/15/2015  . Abdominal pain 11/15/2015  . Major depressive disorder, recurrent episode, moderate with anxious distress ([place]) 10/30/2015  . Alcohol use disorder, severe, dependence ([place]) 10/29/2015  . COPD exacerbation ([place]) 09/20/2015  . Acute respiratory failure with hypoxia ([place]) 09/18/2015  . Suicidal ideation 09/17/2015  . Alcohol intoxication ([place]) 09/17/2015  . Depression 09/17/2015  . Benign essential HTN 09/17/2015  . Hypokalemia 09/17/2015  . Hyponatremia 09/17/2015  . Coffee ground emesis 09/17/2015  . COPD (chronic obstructive pulmonary disease) ([place]) 02/17/2013  . GERD (gastroesophageal reflux disease) 02/17/2013    Past Surgical History:  Procedure Laterality Date  . BACK SURGERY     3 cervical spine surgeries C4-C5 fused  . COLONOSCOPY N/A 01/04/2014   Procedure: COLONOSCOPY;  Surgeon: Danie Binder, MD;  Location: [facility] ENDO [facility];  Service: Endoscopy;  Laterality: N/A;  1:45  . ESOPHAGOGASTRODUODENOSCOPY N/A 01/04/2014   Procedure: ESOPHAGOGASTRODUODENOSCOPY (EGD);  Surgeon: Danie Binder, MD;  Location: [facility] ENDO SUITE;  Service: Endoscopy;  Laterality: N/A;  . FINGER SURGERY Left    2nd, 3rd, & 4th fingers were cut off by table saw and reattached  . GASTRECTOMY    . HERNIA REPAIR    .  INCISIONAL HERNIA REPAIR N/A 01/20/2014   Procedure: LAPAROSCOPIC RECURRENT  INCISIONAL HERNIA with mesh;  Surgeon: Edward Jolly, MD;  Location: [facility] ORS;  Service: General;  Laterality: N/A;  . rt knee arthroscopic surgery    . SHOULDER SURGERY Bilateral    3 surgeries on on left, 2 surgeries on right         Home Medications    Prior to Admission  medications   Medication Sig Start Date End Date Taking? Authorizing Provider  Aspirin-Acetaminophen-Caffeine (GOODYS EXTRA STRENGTH PO) Take 2 packets by mouth daily as needed (pain).    [provider]  buPROPion (WELLBUTRIN XL) 150 MG 24 hr tablet Take 1 tablet (150 mg total) by mouth daily. 05/22/18   Faythe Dingwall, DO  gabapentin (NEURONTIN) 300 MG capsule Take 1 capsule (300 mg total) by mouth 3 (three) times daily. 05/22/18   Faythe Dingwall, DO  traZODone (DESYREL) 50 MG tablet Take 1 tablet (50 mg total) by mouth at bedtime as needed for sleep. Patient not taking: Reported on 05/21/2018 08/29/17   Money, Lowry Ram, FNP  traZODone (DESYREL) 50 MG tablet Take 1 tablet (50 mg total) by mouth at bedtime as needed for sleep. 05/22/18   Faythe Dingwall, DO    Family History Family History  Problem Relation Age of Onset  . Cancer Father        bone  . Cancer Brother        lungs  . Stroke Maternal Grandmother   . Asthma Son        died at age 17 in his sleep   . Spina bifida Son        died at age 89   . Dementia Mother   . Colon cancer Neg Hx     Social History Social History   Tobacco Use  . Smoking status: Current Every Day Smoker    Packs/day: 2.00    Years: 52.00    Pack years: 104.00    Types: Cigarettes    Start date: 07/30/1966  . Smokeless tobacco: Never Used  . Tobacco comment: down to 1/4 ppd  Substance Use Topics  . Alcohol use: Yes    Alcohol/week: 3.0 - 4.0 standard drinks    Types: 3 - 4 Cans of beer per week    Comment: everyday- 3-4 40 oz  . Drug use: No    Comment: denied using any drugs     Allergies   Bee venom and Penicillins   Review of Systems Review of Systems  Unable to perform ROS: Mental status change     Physical Exam Updated Vital Signs BP 114/76   Pulse 78   Resp 20   Wt 66 kg   SpO2 100%   BMI 24.21 kg/m   Physical Exam Vitals signs and nursing note reviewed.  Constitutional:      General: He  is not in acute distress.    Appearance: He is not diaphoretic.     Comments: Disheveled appearance  HENT:     Head: Atraumatic.     Nose: Nose normal.  Eyes:     Extraocular Movements: Extraocular movements intact.     Pupils: Pupils are equal, round, and reactive to light.     Comments: Pupils are 4 mm bilaterally.  Equal round and reactive to light.  Neck:     Musculoskeletal: Neck supple.  Cardiovascular:     Rate and Rhythm: Normal rate and regular rhythm.  Comments: Bilateral upper and lower distal extremities are minimally cool to the touch.  No mottling.  Radial and DP pulses are 2+ and symmetric. Pulmonary:     Breath sounds: No wheezing.  Abdominal:     General: There is no distension.     Palpations: There is no mass.     Tenderness: There is no abdominal tenderness. There is no guarding or rebound.     Hernia: No hernia is present.  Musculoskeletal:     Right lower leg: No edema.     Left lower leg: No edema.  Skin:    General: Skin is dry.     Capillary Refill: Capillary refill takes less than 2 seconds.  Neurological:     Comments: GCS 11- opens eyes to voice, verbal with inappropriate words, and localizes pain.  When asked to perform finger-to-nose, patient moves his finger to his nose, but then falls asleep before completing the command.  Moves all 4 extremities.    ED Treatments / Results  Labs (all labs ordered are listed, but only abnormal results are displayed) Labs Reviewed  ETHANOL - Abnormal; Notable for the following components:      Result Value   Alcohol, Ethyl (B) 352 (*)    All other components within normal limits  COMPREHENSIVE METABOLIC PANEL - Abnormal; Notable for the following components:   CO2 19 (*)    BUN 6 (*)    AST 42 (*)    All other components within normal limits  CBC WITH DIFFERENTIAL/PLATELET - Abnormal; Notable for the following components:   WBC 10.7 (*)    MCHC 29.8 (*)    RDW 23.9 (*)    Neutro Abs 8.1 (*)     Monocytes Absolute 0.0 (*)    All other components within normal limits  ACETAMINOPHEN LEVEL - Abnormal; Notable for the following components:   Acetaminophen (Tylenol), Serum <10 (*)    All other components within normal limits  SALICYLATE LEVEL  CBG MONITORING, ED  I-STAT TROPONIN, ED    EKG EKG Interpretation  Date/Time:  Thursday July 17 2018 11:38:20 EST Ventricular Rate:  70 PR Interval:    QRS Duration: 103 QT Interval:  408 QTC Calculation: 441 R Axis:   72 Text Interpretation:  Sinus rhythm No significant change since last tracing Confirmed by Duffy Bruce [phone]) on 07/17/2018 1:06:54 PM   Radiology Ct Head Wo Contrast  Result Date: 07/17/2018 CLINICAL DATA:  Altered level of consciousness EXAM: CT HEAD WITHOUT CONTRAST TECHNIQUE: Contiguous axial images were obtained from the base of the skull through the vertex without intravenous contrast. COMPARISON:  CT head 08/02/2016 FINDINGS: Brain: Mild to moderate atrophy unchanged. Negative for hydrocephalus. Negative for acute infarct, hemorrhage, or mass. No midline shift. Vascular: Negative for hyperdense vessel Skull: Negative Sinuses/Orbits: Small air-fluid level right maxillary sinus, improved from the prior study. Small air-fluid level left sphenoid sinus. Remaining sinuses clear. Normal orbit Other: None IMPRESSION: Generalized atrophy without acute abnormality Small air-fluid level right maxillary sinus and left sphenoid sinus. Electronically Signed   By: Franchot Gallo M.D.   On: 07/17/2018 14:06    Procedures Procedures (including critical care time)  Medications Ordered in ED Medications - No data to display   Initial Impression / Assessment and Plan / ED Course  I have reviewed the triage vital signs and the nursing notes.  Pertinent labs & imaging results that were available during my care of the patient were reviewed by me and considered in my  medical decision making (see chart for details).      62 year old male with a history of alcohol use disorder, COPD, CAD, GERD, HTN, and stroke presenting by EMS from a homeless shelter with AMS and questionable syncopal episode.  Patient was independently evaluated and discussed with Dr. Ellender Hose, attending physician.  GCS 11 on initial exam.  Ethanol level is 352, minimally increased from the patient's last ER visit.  Bicarb is 19, decreased from previous.  CT head with sinusitis, but otherwise unremarkable.  Low suspicion for cardiac or neurologic etiology at this time.  I suspect the patient's unresponsive episode was secondary to acute alcohol intoxication.  On reevaluation, the patient's mentation continues to gradually improve.  The patient endorses to Dr. Ellender Hose that he did drink some alcohol earlier today.  Will plan for reevaluation and likely discharge after the patient has metabolized ethanol. Patient care transferred to PA [place] at the end of my shift. Patient presentation, ED course, and plan of care discussed with review of all pertinent labs and imaging. Please see his/her note for further details regarding further ED course and disposition.  Final Clinical Impressions(s) / ED Diagnoses   Final diagnoses:  None    ED Discharge Orders    None       Joanne Gavel, PA-C 07/17/18 1724    Duffy Bruce, MD 07/19/18 [phone]

## 2018-07-17 NOTE — ED Triage Notes (Signed)
Pt in from homeless shelter via [facility] with syncope, AMS - per staff, pt became unconscious while sitting in chair at facility. Hx of ETOH abuse and cardiac hx. Arrives a&ox2

## 2018-07-17 NOTE — ED Provider Notes (Signed)
Patient assumed at shift change from [facility], PA-C.  See her note for further HPI.  In summation, 62 year old male with history of alcohol abuse disorder, COPD, CAD, GERD, hypertension, CVA who presented to the emergency department via EMS with a chief complaint of altered mental status.  EMS noted patient to be unconscious while sitting in a chair.  During 30-minute observation time with EMS patient was initially unresponsive for less than 1 minute but gradually became responsive to pain and then to voice.  EMS states patient was not following commands or answering questions.  On previous providers initial evaluation patient opened his eyes to voice.  He attempted to follow commands but continues to fall asleep.  Initial GCS was 11.  Per previous providers chart review patient had 4 previous ED visits in September for acute alcohol intoxication.  Patient endorses alcohol use to attending, Dr. Ellender Hose.  Patient likely unresponsive episode secondary to acute alcohol intoxication.   At care transfer, patient had a negative work-up, however was noted to have an ethanol level of 352.  Patient was to clinically sober in the ED and be discharged. Physical Exam  BP 107/72 (BP Location: Left Arm)   Pulse 89   Resp 16   Wt 66 kg   SpO2 96%   BMI 24.21 kg/m   Physical Exam Vitals signs and nursing note reviewed.  Constitutional:      General: He is not in acute distress.    Appearance: He is well-developed. He is not ill-appearing, toxic-appearing or diaphoretic.     Comments: Patient arousable to voice.  Patient voices that he would like to eat and drink.  HENT:     Head: Atraumatic.     Mouth/Throat:     Lips: Pink.     Mouth: Mucous membranes are moist.     Pharynx: Oropharynx is clear. Uvula midline.  Eyes:     Pupils: Pupils are equal, round, and reactive to light.  Neck:     Musculoskeletal: Full passive range of motion without pain, normal range of motion and neck supple.     Trachea:  Phonation normal.  Cardiovascular:     Rate and Rhythm: Normal rate and regular rhythm.     Pulses: Normal pulses.     Heart sounds: Normal heart sounds. No murmur. No friction rub. No gallop.   Pulmonary:     Effort: Pulmonary effort is normal. No tachypnea, accessory muscle usage or respiratory distress.     Breath sounds: Normal breath sounds. No stridor. No decreased breath sounds, wheezing, rhonchi or rales.  Chest:     Chest wall: No mass or tenderness.  Abdominal:     General: There is no distension.     Palpations: Abdomen is soft. There is no mass.     Tenderness: There is no abdominal tenderness. There is no right CVA tenderness, left CVA tenderness, guarding or rebound.     Hernia: No hernia is present.  Musculoskeletal: Normal range of motion.     Comments: Moves all extremities without difficulty or without ataxia.  Full range of motion without difficulty.  Skin:    General: Skin is warm and dry.     Comments: No rashes or lesions.  Neurological:     General: No focal deficit present.     Mental Status: He is alert.     Cranial Nerves: Cranial nerves are intact. No cranial nerve deficit, dysarthria or facial asymmetry.     Sensory: Sensation is intact.  Motor: Motor function is intact. No weakness, tremor or pronator drift.     Coordination: Coordination is intact. Romberg sign negative. Coordination normal. Finger-Nose-Finger Test and Heel to [facility] Test normal.     Gait: Gait is intact.     Deep Tendon Reflexes: Reflexes are normal and symmetric.     Comments:   Finger-to-nose without ataxia.  Normal heel-to-shin.  5/5 strength to bilateral upper and lower extremities.  Cranial nerves II through XII grossly intact.  No dysarthria.  No evidence of facial asymmetry.  Pronator drift.  Negative Romberg. Follows commands without difficulty.  Ambulates in department without difficulty or without ataxia.  Psychiatric:        Mood and Affect: Mood is depressed.        Speech:  Speech normal.        Behavior: Behavior is withdrawn.        Thought Content: Thought content is not paranoid or delusional. Thought content includes suicidal ideation. Thought content does not include homicidal ideation. Thought content includes suicidal plan. Thought content does not include homicidal plan.    ED Course/Procedures     Procedures   Labs Reviewed  ETHANOL - Abnormal; Notable for the following components:      Result Value   Alcohol, Ethyl (B) 352 (*)    All other components within normal limits  COMPREHENSIVE METABOLIC PANEL - Abnormal; Notable for the following components:   CO2 19 (*)    BUN 6 (*)    AST 42 (*)    All other components within normal limits  CBC WITH DIFFERENTIAL/PLATELET - Abnormal; Notable for the following components:   WBC 10.7 (*)    MCHC 29.8 (*)    RDW 23.9 (*)    Neutro Abs 8.1 (*)    Monocytes Absolute 0.0 (*)    All other components within normal limits  ACETAMINOPHEN LEVEL - Abnormal; Notable for the following components:   Acetaminophen (Tylenol), Serum <10 (*)    All other components within normal limits  SALICYLATE LEVEL  RAPID URINE DRUG SCREEN, HOSP PERFORMED  CBG MONITORING, ED  I-STAT TROPONIN, ED    Ct Head Wo Contrast  Result Date: 07/17/2018 CLINICAL DATA:  Altered level of consciousness EXAM: CT HEAD WITHOUT CONTRAST TECHNIQUE: Contiguous axial images were obtained from the base of the skull through the vertex without intravenous contrast. COMPARISON:  CT head 08/02/2016 FINDINGS: Brain: Mild to moderate atrophy unchanged. Negative for hydrocephalus. Negative for acute infarct, hemorrhage, or mass. No midline shift. Vascular: Negative for hyperdense vessel Skull: Negative Sinuses/Orbits: Small air-fluid level right maxillary sinus, improved from the prior study. Small air-fluid level left sphenoid sinus. Remaining sinuses clear. Normal orbit Other: None IMPRESSION: Generalized atrophy without acute abnormality Small  air-fluid level right maxillary sinus and left sphenoid sinus. Electronically Signed   By: Franchot Gallo M.D.   On: 07/17/2018 14:43   MDM   62 year old gentleman who appears otherwise well presented for evaluation via EMS for altered mental status.  Patient with history of alcohol abuse, CVA, CAD.  Patient was medically cleared by previous provider, however needed sobering as his alcohol level was 350 during his visit.  Labs reassuring, troponin negative, head CT negative EKG without ischemic changes.  Afebrile, nonseptic, non-ill-appearing.  1700: Arousable by voice.  Moves all extremities without difficulty.  Patient still sleepy on evaluation.  We will continue to reevaluate.  1900: Patient able to eat and drink without difficulty.  Will trial of ambulation and reevaluate.  2030: Tech was able to ambulate patient, however patient was unsteady on feet.  Will allow patient to continue to sober and reevaluate. Patient voices SI this time.  Patient states he has access to guns and he will "blow my brains out" or "walk into traffic."  Denies history of suicide attempts.  2215: Reevaluation patient unwilling to try and ambulate.  States he is "tired". Low suspicion for ACS, CVA, PE causing patient's symptoms.  Head CT negative.  Lab work unremarkable. EKG sinus rhythm without any evidence of acute ischemia.  2300: Patient able to ambulate in hall without difficulty.  Patient is clinically sober.  Patient continues to voice SI.  Denies SI or AVH.  Labs unremarkable, salicylate, acetaminophen negative.  Ethanol 352.  UDS pending, however patient is still medically cleared and this will not change his disposition pending results. Patient is hemodynamically stable.  Patient has had psych hold orders placed as well as suicide precautions.  Patient will need to be evaluated by psychiatry.  Patient is medically cleared to be evaluated by TTS for SI with plan.      Osie Amparo A, PA-C 07/17/18  2329    Fredia Sorrow, MD 07/18/18 [phone]

## 2018-07-17 NOTE — ED Notes (Signed)
Pt was able to ambulate to bathroom

## 2018-07-18 ENCOUNTER — Encounter (HOSPITAL_COMMUNITY): Payer: Self-pay

## 2018-07-18 ENCOUNTER — Inpatient Hospital Stay (HOSPITAL_COMMUNITY)
Admission: AD | Admit: 2018-07-18 | Discharge: 2018-07-22 | DRG: 885 | Disposition: A | Discharge status: Home / Self Care | Payer: Medicaid Other | Source: Intra-hospital | Attending: Psychiatry | Admitting: Psychiatry

## 2018-07-18 ENCOUNTER — Other Ambulatory Visit: Payer: Self-pay

## 2018-07-18 DIAGNOSIS — J449 Chronic obstructive pulmonary disease, unspecified: Secondary | ICD-10-CM | POA: Diagnosis present

## 2018-07-18 DIAGNOSIS — I10 Essential (primary) hypertension: Secondary | ICD-10-CM | POA: Diagnosis present

## 2018-07-18 DIAGNOSIS — R45851 Suicidal ideations: Secondary | ICD-10-CM | POA: Diagnosis present

## 2018-07-18 DIAGNOSIS — Z8673 Personal history of transient ischemic attack (TIA), and cerebral infarction without residual deficits: Secondary | ICD-10-CM | POA: Diagnosis not present

## 2018-07-18 DIAGNOSIS — Z823 Family history of stroke: Secondary | ICD-10-CM

## 2018-07-18 DIAGNOSIS — F419 Anxiety disorder, unspecified: Secondary | ICD-10-CM | POA: Diagnosis present

## 2018-07-18 DIAGNOSIS — Z880 Allergy status to penicillin: Secondary | ICD-10-CM | POA: Diagnosis not present

## 2018-07-18 DIAGNOSIS — Y908 Blood alcohol level of 240 mg/100 ml or more: Secondary | ICD-10-CM | POA: Diagnosis present

## 2018-07-18 DIAGNOSIS — F10920 Alcohol use, unspecified with intoxication, uncomplicated: Secondary | ICD-10-CM | POA: Diagnosis not present

## 2018-07-18 DIAGNOSIS — F332 Major depressive disorder, recurrent severe without psychotic features: Secondary | ICD-10-CM | POA: Diagnosis present

## 2018-07-18 DIAGNOSIS — K219 Gastro-esophageal reflux disease without esophagitis: Secondary | ICD-10-CM | POA: Diagnosis present

## 2018-07-18 DIAGNOSIS — Z590 Homelessness: Secondary | ICD-10-CM | POA: Diagnosis not present

## 2018-07-18 DIAGNOSIS — Z91030 Bee allergy status: Secondary | ICD-10-CM

## 2018-07-18 DIAGNOSIS — G4700 Insomnia, unspecified: Secondary | ICD-10-CM | POA: Diagnosis not present

## 2018-07-18 DIAGNOSIS — I2510 Atherosclerotic heart disease of native coronary artery without angina pectoris: Secondary | ICD-10-CM | POA: Diagnosis present

## 2018-07-18 DIAGNOSIS — F17210 Nicotine dependence, cigarettes, uncomplicated: Secondary | ICD-10-CM | POA: Diagnosis present

## 2018-07-18 DIAGNOSIS — F1020 Alcohol dependence, uncomplicated: Secondary | ICD-10-CM | POA: Diagnosis present

## 2018-07-18 DIAGNOSIS — F10229 Alcohol dependence with intoxication, unspecified: Secondary | ICD-10-CM | POA: Diagnosis present

## 2018-07-18 LAB — TSH: TSH: 1.048 u[IU]/mL (ref 0.350–4.500)

## 2018-07-18 MED ORDER — THIAMINE HCL 100 MG/ML IJ SOLN: 100.0000 mg | Freq: Once | INTRAMUSCULAR | Status: DC

## 2018-07-18 MED ORDER — LORAZEPAM 1 MG PO TABS
1.0000 mg | ORAL_TABLET | Freq: Four times a day (QID) | ORAL | Status: AC
  Administered 2018-07-18 (×3): 1 mg via ORAL
  Filled 2018-07-18 (×4): qty 1

## 2018-07-18 MED ORDER — ADULT MULTIVITAMIN W/MINERALS CH
1.0000 | ORAL_TABLET | Freq: Every day | ORAL | Status: DC
  Administered 2018-07-19 – 2018-07-22 (×4): 1 via ORAL
  Filled 2018-07-18 (×4): qty 1
  Filled 2018-07-18: qty 7
  Filled 2018-07-18 (×2): qty 1

## 2018-07-18 MED ORDER — HYDROXYZINE HCL 25 MG PO TABS
25.0000 mg | ORAL_TABLET | Freq: Four times a day (QID) | ORAL | Status: AC | PRN
  Administered 2018-07-19: 25 mg via ORAL
  Filled 2018-07-18: qty 1

## 2018-07-18 MED ORDER — ACETAMINOPHEN 325 MG PO TABS
650.0000 mg | ORAL_TABLET | Freq: Four times a day (QID) | ORAL | Status: DC | PRN
  Administered 2018-07-19 – 2018-07-22 (×7): 650 mg via ORAL
  Filled 2018-07-18 (×6): qty 2

## 2018-07-18 MED ORDER — HYDROXYZINE HCL 25 MG PO TABS: 25.0000 mg | ORAL_TABLET | Freq: Four times a day (QID) | ORAL | Status: DC | PRN

## 2018-07-18 MED ORDER — TRAZODONE HCL 100 MG PO TABS
100.0000 mg | ORAL_TABLET | Freq: Every evening | ORAL | Status: DC | PRN
  Administered 2018-07-19 – 2018-07-21 (×4): 100 mg via ORAL
  Filled 2018-07-18 (×13): qty 1

## 2018-07-18 MED ORDER — VITAMIN B-1 100 MG PO TABS
100.0000 mg | ORAL_TABLET | Freq: Every day | ORAL | Status: DC
  Administered 2018-07-19 – 2018-07-22 (×4): 100 mg via ORAL
  Filled 2018-07-18 (×6): qty 1

## 2018-07-18 MED ORDER — ALUM & MAG HYDROXIDE-SIMETH 200-200-20 MG/5ML PO SUSP
30.0000 mL | ORAL | Status: DC | PRN
  Administered 2018-07-21: 30 mL via ORAL
  Filled 2018-07-18: qty 30

## 2018-07-18 MED ORDER — LORAZEPAM 1 MG PO TABS
1.0000 mg | ORAL_TABLET | Freq: Three times a day (TID) | ORAL | Status: AC
  Administered 2018-07-19 (×3): 1 mg via ORAL
  Filled 2018-07-18 (×3): qty 1

## 2018-07-18 MED ORDER — LOPERAMIDE HCL 2 MG PO CAPS: 2.0000 mg | ORAL_CAPSULE | ORAL | Status: AC | PRN

## 2018-07-18 MED ORDER — PROPRANOLOL HCL 10 MG PO TABS
10.0000 mg | ORAL_TABLET | Freq: Three times a day (TID) | ORAL | Status: DC
  Administered 2018-07-18 – 2018-07-22 (×12): 10 mg via ORAL
  Filled 2018-07-18 (×7): qty 1
  Filled 2018-07-18: qty 21
  Filled 2018-07-18: qty 1
  Filled 2018-07-18: qty 21
  Filled 2018-07-18 (×6): qty 1
  Filled 2018-07-18: qty 21
  Filled 2018-07-18: qty 1

## 2018-07-18 MED ORDER — CITALOPRAM HYDROBROMIDE 20 MG PO TABS
20.0000 mg | ORAL_TABLET | Freq: Every day | ORAL | Status: DC
  Administered 2018-07-18 – 2018-07-22 (×5): 20 mg via ORAL
  Filled 2018-07-18 (×5): qty 1
  Filled 2018-07-18: qty 7
  Filled 2018-07-18 (×2): qty 1

## 2018-07-18 MED ORDER — MAGNESIUM HYDROXIDE 400 MG/5ML PO SUSP: 30.0000 mL | Freq: Every day | ORAL | Status: DC | PRN

## 2018-07-18 MED ORDER — ONDANSETRON 4 MG PO TBDP
4.0000 mg | ORAL_TABLET | Freq: Four times a day (QID) | ORAL | Status: AC | PRN
  Administered 2018-07-18 – 2018-07-20 (×2): 4 mg via ORAL
  Filled 2018-07-18 (×2): qty 1

## 2018-07-18 MED ORDER — VITAMIN B-1 100 MG PO TABS: 100.0000 mg | ORAL_TABLET | Freq: Every day | ORAL | Status: DC

## 2018-07-18 MED ORDER — ONDANSETRON 4 MG PO TBDP: 4.0000 mg | ORAL_TABLET | Freq: Four times a day (QID) | ORAL | Status: DC | PRN

## 2018-07-18 MED ORDER — LORAZEPAM 1 MG PO TABS: 1.0000 mg | ORAL_TABLET | Freq: Four times a day (QID) | ORAL | Status: DC | PRN

## 2018-07-18 MED ORDER — LORAZEPAM 1 MG PO TABS: 1.0000 mg | ORAL_TABLET | Freq: Four times a day (QID) | ORAL | Status: AC | PRN

## 2018-07-18 MED ORDER — LORAZEPAM 1 MG PO TABS
1.0000 mg | ORAL_TABLET | Freq: Two times a day (BID) | ORAL | Status: AC
  Administered 2018-07-20 (×2): 1 mg via ORAL
  Filled 2018-07-18 (×2): qty 1

## 2018-07-18 MED ORDER — LORAZEPAM 1 MG PO TABS
1.0000 mg | ORAL_TABLET | Freq: Every day | ORAL | Status: AC
  Administered 2018-07-21: 1 mg via ORAL
  Filled 2018-07-18: qty 1

## 2018-07-18 MED ORDER — ADULT MULTIVITAMIN W/MINERALS CH: 1.0000 | ORAL_TABLET | Freq: Every day | ORAL | Status: DC

## 2018-07-18 MED ORDER — LOPERAMIDE HCL 2 MG PO CAPS: 2.0000 mg | ORAL_CAPSULE | ORAL | Status: DC | PRN

## 2018-07-18 NOTE — H&P (Signed)
Psychiatric Admission Assessment Adult  Patient Identification: Samuel Reyes MRN:  [id number] Date of Evaluation:  07/18/2018 Chief Complaint:  MDD recurrent severe Principal Diagnosis: Alcohol intoxication with mental status alterations followed by endorsing suicidal thoughts once aroused Diagnosis:  Active Problems:   MDD (major depressive disorder), recurrent episode, severe ([place])  History of Present Illness:   Samuel Reyes is known to the service he is a 62 year old homeless individual with a history of hypertension, alcohol dependency and cocaine abuse, COPD, coronary disease, and he presented through the emergency department on 12/19 in the morning hours, with mental status changes.  His blood alcohol level was noted to be 352.  Patient was staying at the [facility] facility and staff noted he was unconscious while seated in a chair and EMS was called. He did arouse but again it was clear he was intoxicated he was brought in for further evaluation and was medically cleared.  Patient states he lives on the streets, there are times he wishes he would go to sleep and not wake up, states he would like some rehab again.  [facility] had the [facility].  States he would need some detox measures as he is drinking daily a variable amount but obviously drinking to the point of intoxication.  At this point acknowledges depression in his situation denies wanting to harm others reports that he has no active suicidal plans other than wishing he would simply not wake up.  Was unable to participate in review of systems when impaired but does now see below  Associated Signs/Symptoms: Depression Symptoms:  depressed mood, (Hypo) Manic Symptoms:  n/a Anxiety Symptoms:  Social Anxiety, Psychotic Symptoms:  n/a PTSD Symptoms: NA Total Time spent with patient: 45 minutes  Past Psychiatric History: Extensive due to substance abuse depression and medical comorbidities,  certainly at high risk for self-harm however patient denies any history of DTs or seizures when coming off of alcohol  Is the patient at risk to self? Yes.    Has the patient been a risk to self in the past 6 months? Yes.    Has the patient been a risk to self within the distant past? Yes.    Is the patient a risk to others? No.  Has the patient been a risk to others in the past 6 months? No.  Has the patient been a risk to others within the distant past? No.   Alcohol Screening:   Substance Abuse History in the last 12 months:  Yes.   Consequences of Substance Abuse: Medical Consequences:  Intoxication of the point of impairment Previous Psychotropic Medications: Yes  Psychological Evaluations: No  Past Medical History:  Past Medical History:  Diagnosis Date  . Alcoholism ([place])   . CAD (coronary artery disease) 2002   MI, no intervention required  . COPD (chronic obstructive pulmonary disease) (H[facility])    not on home O2  . Depression   . GERD (gastroesophageal reflux disease)   . Headache   . Hypertension   . Stroke [facility]) 2006    Past Surgical History:  Procedure Laterality Date  . BACK SURGERY     3 cervical spine surgeries C4-C5 fused  . COLONOSCOPY N/A 01/04/2014   Procedure: COLONOSCOPY;  Surgeon: Danie Binder, MD;  Location: [facility];  Service: Endoscopy;  Laterality: N/A;  1:45  . ESOPHAGOGASTRODUODENOSCOPY N/A 01/04/2014   Procedure: ESOPHAGOGASTRODUODENOSCOPY (EGD);  Surgeon: Danie Binder, MD;  Location: [facility];  Service:  Endoscopy;  Laterality: N/A;  . FINGER SURGERY Left    2nd, 3rd, & 4th fingers were cut off by table saw and reattached  . GASTRECTOMY    . HERNIA REPAIR    . INCISIONAL HERNIA REPAIR N/A 01/20/2014   Procedure: LAPAROSCOPIC RECURRENT  INCISIONAL HERNIA with mesh;  Surgeon: Edward Jolly, MD;  Location: [facility]S;  Service: General;  Laterality: N/A;  . rt knee arthroscopic surgery    . SHOULDER SURGERY Bilateral    3 surgeries on on  left, 2 surgeries on right    Family History:  Family History  Problem Relation Age of Onset  . Cancer Father        bone  . Cancer Brother        lungs  . Stroke Maternal Grandmother   . Asthma Son        died at age 86 in his sleep   . Spina bifida Son        died at age 44   . Dementia Mother   . Colon cancer Neg Hx    Family Psychiatric  History: Patient denied Tobacco Screening:   Social History:  Social History   Substance and Sexual Activity  Alcohol Use Yes  . Alcohol/week: 3.0 - 4.0 standard drinks  . Types: 3 - 4 Cans of beer per week   Comment: everyday- 3-4 40 oz     Social History   Substance and Sexual Activity  Drug Use No   Comment: denied using any drugs     Allergies:   Allergies  Allergen Reactions  . Bee Venom Anaphylaxis  . Penicillins Rash    Has patient had a PCN reaction causing immediate rash, facial/tongue/throat swelling, SOB or lightheadedness with hypotension: {Yes Has patient had a PCN reaction causing severe rash involving mucus membranes or skin necrosis: YES Has patient had a PCN reaction that required hospitalization Yes Has patient had a PCN reaction occurring within the last 10 years: YES If all of the above answers are "NO", then may proceed with Cephalosporin use.    Lab Results:  Results for orders placed or performed during the hospital encounter of 07/17/18 (from the past 48 hour(s))  CBG monitoring, ED     Status: None   Collection Time: 07/17/18 12:40 PM  Result Value Ref Range   Glucose-Capillary 91 70 - 99 mg/dL  Ethanol     Status: Abnormal   Collection Time: 07/17/18 12:42 PM  Result Value Ref Range   Alcohol, Ethyl (B) 352 (HH) <10 mg/dL    Comment: CRITICAL RESULT CALLED TO, READ BACK BY AND VERIFIED WITH: J NARON,RN AT 1357 07/17/18 BY L BENFIELD (NOTE) Lowest detectable limit for serum alcohol is 10 mg/dL. For medical purposes only. Performed at [facility], [place] [address] [place],   [address]   Comprehensive metabolic panel     Status: Abnormal   Collection Time: 07/17/18 12:42 PM  Result Value Ref Range   Sodium 140 135 - 145 mmol/L   Potassium 4.9 3.5 - 5.1 mmol/L    Comment: SLIGHT HEMOLYSIS   Chloride 107 98 - 111 mmol/L   CO2 19 (L) 22 - 32 mmol/L   Glucose, Bld 91 70 - 99 mg/dL   BUN 6 (L) 8 - 23 mg/dL   Creatinine, Ser 0.83 0.61 - 1.24 mg/dL   Calcium 8.9 8.9 - 10.3 mg/dL   Total Protein 7.7 6.5 - 8.1 g/dL   Albumin 4.4 3.5 -  5.0 g/dL   AST 42 (H) 15 - 41 U/L   ALT 21 0 - 44 U/L   Alkaline Phosphatase 63 38 - 126 U/L   Total Bilirubin 0.8 0.3 - 1.2 mg/dL   GFR calc non Af Amer >60 >60 mL/min   GFR calc Af Amer >60 >60 mL/min   Anion gap 14 5 - 15    Comment: Performed at [facility] [address], [place], [place] [address]  CBC WITH DIFFERENTIAL     Status: Abnormal   Collection Time: 07/17/18 12:42 PM  Result Value Ref Range   WBC 10.7 (H) 4.0 - 10.5 K/uL   RBC 5.38 4.22 - 5.81 MIL/uL   Hemoglobin 14.4 13.0 - 17.0 g/dL   HCT 48.3 39.0 - 52.0 %   MCV 89.8 80.0 - 100.0 fL   MCH 26.8 26.0 - 34.0 pg   MCHC 29.8 (L) 30.0 - 36.0 g/dL   RDW 23.9 (H) 11.5 - 15.5 %   Platelets 337 150 - 400 K/uL   nRBC 0.0 0.0 - 0.2 %   Neutrophils Relative % 76 %   Neutro Abs 8.1 (H) 1.7 - 7.7 K/uL   Lymphocytes Relative 23 %   Lymphs Abs 2.5 0.7 - 4.0 K/uL   Monocytes Relative 0 %   Monocytes Absolute 0.0 (L) 0.1 - 1.0 K/uL   Eosinophils Relative 0 %   Eosinophils Absolute 0.0 0.0 - 0.5 K/uL   Basophils Relative 1 %   Basophils Absolute 0.1 0.0 - 0.1 K/uL   nRBC 0 0 /100 WBC   Abs Immature Granulocytes 0.00 0.00 - 0.07 K/uL   Acanthocytes PRESENT    Polychromasia PRESENT     Comment: Performed at [facility], [place] [address] [place], [place] [address]  Salicylate level     Status: None   Collection Time: 07/17/18 12:42 PM  Result Value Ref Range   Salicylate Lvl <1.9 2.8 - 30.0 mg/dL    Comment: Performed at [facility] [address], [place], [place] [address]  Acetaminophen level     Status: Abnormal   Collection Time: 07/17/18 12:42 PM  Result Value Ref Range   Acetaminophen (Tylenol), Serum <10 (L) 10 - 30 ug/mL    Comment: (NOTE) Therapeutic concentrations vary significantly. A range of 10-30 ug/mL  may be an effective concentration for many patients. However, some  are best treated at concentrations outside of this range. Acetaminophen concentrations >150 ug/mL at 4 hours after ingestion  and >50 ug/mL at 12 hours after ingestion are often associated with  toxic reactions. Performed at [facility], [place] [address] [place], [place] [address]   I-Stat Troponin, ED (not at [facility])     Status: None   Collection Time: 07/17/18  1:25 PM  Result Value Ref Range   Troponin i, poc 0.02 0.00 - 0.08 ng/mL   Comment 3            Comment: Due to the release kinetics of cTnI, a negative result within the first hours of the onset of symptoms does not rule out myocardial infarction with certainty. If myocardial infarction is still suspected, repeat the test at appropriate intervals.   Rapid urine drug screen (hospital performed)     Status: None   Collection Time: 07/17/18 11:04 PM  Result Value Ref Range   Opiates NONE DETECTED NONE DETECTED   Cocaine NONE DETECTED NONE DETECTED   Benzodiazepines NONE DETECTED NONE DETECTED  Amphetamines NONE DETECTED NONE DETECTED   Tetrahydrocannabinol NONE DETECTED NONE DETECTED   Barbiturates NONE DETECTED NONE DETECTED    Comment: (NOTE) DRUG SCREEN FOR MEDICAL PURPOSES ONLY.  IF CONFIRMATION IS NEEDED FOR ANY PURPOSE, NOTIFY LAB WITHIN 5 DAYS. LOWEST DETECTABLE LIMITS FOR URINE DRUG SCREEN Drug Class                     Cutoff (ng/mL) Amphetamine and metabolites    1000 Barbiturate and metabolites    200 Benzodiazepine                 518 Tricyclics and metabolites     300 Opiates and metabolites        300 Cocaine and metabolites        300 THC                             50 Performed at [facility], [place] [address], [place], [place] [address]     Blood Alcohol level:  Lab Results  Component Value Date   ETH 352 [facility] 07/17/2018   ETH 337 (HH) 01/27/1600    Metabolic Disorder Labs:  Lab Results  Component Value Date   HGBA1C 5.4 06/26/2017   MPG 108 06/26/2017   MPG 105 08/03/2016   No results found for: PROLACTIN Lab Results  Component Value Date   CHOL 247 (H) 02/17/2013   TRIG 144 02/17/2013   HDL 79 02/17/2013   CHOLHDL 3.1 02/17/2013   VLDL 29 02/17/2013   LDLCALC 139 (H) 02/17/2013    Current Medications: Current Facility-Administered Medications  Medication Dose Route Frequency Provider Last Rate Last Dose  . acetaminophen (TYLENOL) tablet 650 mg  650 mg Oral Q6H PRN Laverle Hobby, PA-C      . alum & mag hydroxide-simeth (MAALOX/MYLANTA) 200-200-20 MG/5ML suspension 30 mL  30 mL Oral Q4H PRN Laverle Hobby, PA-C      . citalopram (CELEXA) tablet 20 mg  20 mg Oral Daily Johnn Hai, MD      . hydrOXYzine (ATARAX/VISTARIL) tablet 25 mg  25 mg Oral Q6H PRN Patriciaann Clan E, PA-C      . loperamide (IMODIUM) capsule 2-4 mg  2-4 mg Oral PRN Laverle Hobby, PA-C      . LORazepam (ATIVAN) tablet 1 mg  1 mg Oral Q6H PRN Laverle Hobby, PA-C      . LORazepam (ATIVAN) tablet 1 mg  1 mg Oral QID Patriciaann Clan E, PA-C   1 mg at 07/18/18 0932   Followed by  . [START ON 07/19/2018] LORazepam (ATIVAN) tablet 1 mg  1 mg Oral TID Laverle Hobby, PA-C       Followed by  . [START ON 07/20/2018] LORazepam (ATIVAN) tablet 1 mg  1 mg Oral BID Patriciaann Clan E, PA-C       Followed by  . [START ON 07/21/2018] LORazepam (ATIVAN) tablet 1 mg  1 mg Oral Daily Simon, Spencer E, PA-C      . magnesium hydroxide (MILK OF MAGNESIA) suspension 30 mL  30 mL Oral Daily PRN Laverle Hobby, PA-C      . multivitamin with minerals tablet 1 tablet  1 tablet Oral Daily Simon, Spencer E, PA-C      . ondansetron (ZOFRAN-ODT)  disintegrating tablet 4 mg  4 mg Oral Q6H PRN Patriciaann Clan E, PA-C   4 mg at 07/18/18 3557  .  thiamine (B-1) injection 100 mg  100 mg Intramuscular Once Laverle Hobby, PA-C      . [START ON 07/19/2018] thiamine (VITAMIN B-1) tablet 100 mg  100 mg Oral Daily Simon, Spencer E, PA-C      . traZODone (DESYREL) tablet 100 mg  100 mg Oral QHS,MR X 1 Simon, Spencer E, PA-C       PTA Medications: Medications Prior to Admission  Medication Sig Dispense Refill Last Dose  . Aspirin-Acetaminophen-Caffeine (GOODYS EXTRA STRENGTH PO) Take 2 packets by mouth daily as needed (pain).   Past Week at Unknown time  . buPROPion (WELLBUTRIN XL) 150 MG 24 hr tablet Take 1 tablet (150 mg total) by mouth daily. 7 tablet 0 Past Week at Unknown time  . gabapentin (NEURONTIN) 300 MG capsule Take 1 capsule (300 mg total) by mouth 3 (three) times daily. 14 capsule 0 Past Week at Unknown time  . traZODone (DESYREL) 50 MG tablet Take 1 tablet (50 mg total) by mouth at bedtime as needed for sleep. (Patient not taking: Reported on 05/21/2018) 30 tablet 0 Not Taking at Unknown time  . traZODone (DESYREL) 50 MG tablet Take 1 tablet (50 mg total) by mouth at bedtime as needed for sleep. 7 tablet 0 Past Week at Unknown time    Musculoskeletal: Strength & Muscle Tone: decreased Gait & Station: unsteady Patient leans: N/A  Psychiatric Specialty Exam: Physical Exam sinus tachycardia since admission  ROS reports history of hypertension reports no thyroid issues  Blood pressure 111/84, pulse (!) 134, temperature 98.5 F (36.9 C), temperature source Oral, resp. rate 16, height 5\' 5"  (1.651 m), weight 66 kg, SpO2 96 %.Body mass index is 24.21 kg/m.  General Appearance: Casual  Eye Contact:  Good  Speech:  Clear and Coherent  Volume:  Decreased  Mood:  Anxious and Depressed  Affect:  Appropriate and Congruent  Thought Process:  Goal Directed  Orientation:  Full (Time, Place, and Person)  Thought Content:  Logical   Suicidal Thoughts:  Yes.  without intent/plan  Homicidal Thoughts:  No  Memory:  Immediate;   Fair  Judgement:  Fair  Insight:  Good  Psychomotor Activity:  Decreased  Concentration:  Concentration: Fair  Recall:  [place] of Knowledge:  Good  Language:  Good  Akathisia:  Negative  Handed:  Right  AIMS (if indicated):     Assets:  Communication Skills Desire for Improvement  ADL's:  Intact  Cognition:  WNL  Sleep:       Treatment Plan Summary: Daily contact with patient to assess and evaluate symptoms and progress in treatment  Observation Level/Precautions:  15 minute checks  Laboratory:  UDS  Psychotherapy: Rehab and cognitive-based  Medications: Detox protocol started  Consultations: Not needed  Discharge Concerns: Rehab needed  Estimated LOS: 5 days  Other: Evaluate tachycardia   Physician Treatment Plan for Primary Diagnosis: Alcoholism, depression, suicidality Long Term Goal(s): Improvement in symptoms so as ready for discharge  Short Term Goals: Ability to identify changes in lifestyle to reduce recurrence of condition will improve and Ability to verbalize feelings will improve  Physician Treatment Plan for Secondary Diagnosis: Active Problems:   MDD (major depressive disorder), recurrent episode, severe ([place])  Long Term Goal(s): Improvement in symptoms so as ready for discharge  Short Term Goals: Ability to demonstrate self-control will improve and Ability to identify and develop effective coping behaviors will improve   In summary, 62 year old disabled homeless individual chronic alcohol dependence and intermittent abuse and  binge type abuse leading to state of impairment.  Reporting depressive symptoms and passive suicidal thoughts.  In need of inpatient stabilization   I certify that inpatient services furnished can reasonably be expected to improve the patient's condition.    Johnn Hai, MD 12/20/20198:48 AM

## 2018-07-18 NOTE — Plan of Care (Signed)
  Problem: Education: Goal: Knowledge of disease or condition will improve Outcome: Progressing   

## 2018-07-18 NOTE — Progress Notes (Signed)
D.  Pt pleasant on approach, brief interaction.  Pt attended part of evening AA group, then went to room to bed.  No complaints voiced.  Pt denies SI/HI/AVH at this time.  A. Support and encouragement offered  R. Pt remains safe on the unit, will continue to monitor.

## 2018-07-18 NOTE — Progress Notes (Signed)
Referrals to [facility] and [facility] made with pt consent.  Fotios Amos S. Ouida Sills, MSW, LCSW Clinical Social Worker 07/18/2018 3:21 PM

## 2018-07-18 NOTE — Tx Team (Signed)
Interdisciplinary Treatment and Diagnostic Plan Update  07/18/2018 Time of Session: 0830AM Samuel Reyes MRN: [id number]  Principal Diagnosis: MDD, recurrent, severe  Secondary Diagnoses: Active Problems:   MDD (major depressive disorder), recurrent episode, severe (HCC)   Current Medications:  Current Facility-Administered Medications  Medication Dose Route Frequency Provider Last Rate Last Dose  . acetaminophen (TYLENOL) tablet 650 mg  650 mg Oral Q6H PRN Laverle Hobby, PA-C      . alum & mag hydroxide-simeth (MAALOX/MYLANTA) 200-200-20 MG/5ML suspension 30 mL  30 mL Oral Q4H PRN Laverle Hobby, PA-C      . citalopram (CELEXA) tablet 20 mg  20 mg Oral Daily Johnn Hai, MD      . hydrOXYzine (ATARAX/VISTARIL) tablet 25 mg  25 mg Oral Q6H PRN Patriciaann Clan E, PA-C      . loperamide (IMODIUM) capsule 2-4 mg  2-4 mg Oral PRN Laverle Hobby, PA-C      . LORazepam (ATIVAN) tablet 1 mg  1 mg Oral Q6H PRN Laverle Hobby, PA-C      . LORazepam (ATIVAN) tablet 1 mg  1 mg Oral QID Patriciaann Clan E, PA-C   1 mg at 07/18/18 0865   Followed by  . [START ON 07/19/2018] LORazepam (ATIVAN) tablet 1 mg  1 mg Oral TID Laverle Hobby, PA-C       Followed by  . [START ON 07/20/2018] LORazepam (ATIVAN) tablet 1 mg  1 mg Oral BID Patriciaann Clan E, PA-C       Followed by  . [START ON 07/21/2018] LORazepam (ATIVAN) tablet 1 mg  1 mg Oral Daily Simon, Spencer E, PA-C      . magnesium hydroxide (MILK OF MAGNESIA) suspension 30 mL  30 mL Oral Daily PRN Laverle Hobby, PA-C      . multivitamin with minerals tablet 1 tablet  1 tablet Oral Daily Simon, Spencer E, PA-C      . ondansetron (ZOFRAN-ODT) disintegrating tablet 4 mg  4 mg Oral Q6H PRN Patriciaann Clan E, PA-C   4 mg at 07/18/18 7846  . propranolol (INDERAL) tablet 10 mg  10 mg Oral TID Johnn Hai, MD      . thiamine (B-1) injection 100 mg  100 mg Intramuscular Once Laverle Hobby, PA-C      . [START ON 07/19/2018] thiamine (VITAMIN  B-1) tablet 100 mg  100 mg Oral Daily Simon, Spencer E, PA-C      . traZODone (DESYREL) tablet 100 mg  100 mg Oral QHS,MR X 1 Simon, Spencer E, PA-C       PTA Medications: Medications Prior to Admission  Medication Sig Dispense Refill Last Dose  . Aspirin-Acetaminophen-Caffeine (GOODYS EXTRA STRENGTH PO) Take 2 packets by mouth daily as needed (pain).   Past Week at Unknown time  . buPROPion (WELLBUTRIN XL) 150 MG 24 hr tablet Take 1 tablet (150 mg total) by mouth daily. 7 tablet 0 Past Week at Unknown time  . gabapentin (NEURONTIN) 300 MG capsule Take 1 capsule (300 mg total) by mouth 3 (three) times daily. 14 capsule 0 Past Week at Unknown time  . traZODone (DESYREL) 50 MG tablet Take 1 tablet (50 mg total) by mouth at bedtime as needed for sleep. (Patient not taking: Reported on 05/21/2018) 30 tablet 0 Not Taking at Unknown time  . traZODone (DESYREL) 50 MG tablet Take 1 tablet (50 mg total) by mouth at bedtime as needed for sleep. 7 tablet 0 Past Week at Unknown time  Patient Stressors: Medication change or noncompliance Occupational concerns Substance abuse  Patient Strengths: Ability for insight Active sense of humor  Treatment Modalities: Medication Management, Group therapy, Case management,  1 to 1 session with clinician, Psychoeducation, Recreational therapy.   Physician Treatment Plan for Primary Diagnosis: MDD, recurrent, severe Long Term Goal(s): Improvement in symptoms so as ready for discharge Improvement in symptoms so as ready for discharge   Short Term Goals: Ability to identify changes in lifestyle to reduce recurrence of condition will improve Ability to verbalize feelings will improve Ability to demonstrate self-control will improve Ability to identify and develop effective coping behaviors will improve  Medication Management: Evaluate patient's response, side effects, and tolerance of medication regimen.  Therapeutic Interventions: 1 to 1 sessions, Unit  Group sessions and Medication administration.  Evaluation of Outcomes: Progressing  Physician Treatment Plan for Secondary Diagnosis: Active Problems:   MDD (major depressive disorder), recurrent episode, severe ([place])  Long Term Goal(s): Improvement in symptoms so as ready for discharge Improvement in symptoms so as ready for discharge   Short Term Goals: Ability to identify changes in lifestyle to reduce recurrence of condition will improve Ability to verbalize feelings will improve Ability to demonstrate self-control will improve Ability to identify and develop effective coping behaviors will improve     Medication Management: Evaluate patient's response, side effects, and tolerance of medication regimen.  Therapeutic Interventions: 1 to 1 sessions, Unit Group sessions and Medication administration.  Evaluation of Outcomes: Progressing   RN Treatment Plan for Primary Diagnosis: MDD, recurrent, severe Long Term Goal(s): Knowledge of disease and therapeutic regimen to maintain health will improve  Short Term Goals: Ability to remain free from injury will improve, Ability to verbalize frustration and anger appropriately will improve, Ability to disclose and discuss suicidal ideas and Ability to identify and develop effective coping behaviors will improve  Medication Management: RN will administer medications as ordered by provider, will assess and evaluate patient's response and provide education to patient for prescribed medication. RN will report any adverse and/or side effects to prescribing provider.  Therapeutic Interventions: 1 on 1 counseling sessions, Psychoeducation, Medication administration, Evaluate responses to treatment, Monitor vital signs and CBGs as ordered, Perform/monitor CIWA, COWS, AIMS and Fall Risk screenings as ordered, Perform wound care treatments as ordered.  Evaluation of Outcomes: Progressing   LCSW Treatment Plan for Primary Diagnosis: MDD, recurrent,  severe Long Term Goal(s): Safe transition to appropriate next level of care at discharge, Engage patient in therapeutic group addressing interpersonal concerns.  Short Term Goals: Engage patient in aftercare planning with referrals and resources, Facilitate patient progression through stages of change regarding substance use diagnoses and concerns and Identify triggers associated with mental health/substance abuse issues  Therapeutic Interventions: Assess for all discharge needs, 1 to 1 time with Social worker, Explore available resources and support systems, Assess for adequacy in community support network, Educate family and significant other(s) on suicide prevention, Complete Psychosocial Assessment, Interpersonal group therapy.  Evaluation of Outcomes: Progressing   Progress in Treatment: Attending groups: No. New to unit. Continuing to assess.  Participating in groups: No. Taking medication as prescribed: Yes. Toleration medication: Yes. Family/Significant other contact made: No, will contact:  family member if pt consents to collateral contact.  Patient understands diagnosis: Yes. Discussing patient identified problems/goals with staff: Yes. Medical problems stabilized or resolved: Yes. Denies suicidal/homicidal ideation: Yes. Issues/concerns per patient self-inventory: Yes. Other: n/a   New problem(s) identified: No, Describe:  n/a  New Short Term/Long Term Goal(s): detox,  medication management for mood stabilization; elimination of SI thoughts; development of comprehensive mental wellness/sobriety plan.   Patient Goals:  "detox and rehabilitation."   Discharge Plan or Barriers: CSW assessing for appropriate referrals. Pt reports that he is homeless chronically. Last admission in 2017, pt was referred to [facility]. [place] pamphlet, Mobile Crisis information, and AA/NA information provided to patient for additional community support and resources.    Reason for  Continuation of Hospitalization: Anxiety Depression Medication stabilization Withdrawal symptoms  Estimated Length of Stay: Tuesday, 07/22/18  Attendees: Patient: Samuel Reyes 07/18/2018 10:26 AM  Physician: Dr. Jake Samples MD 07/18/2018 10:26 AM  Nursing: Chong Sicilian RN; Legrand Como RN 07/18/2018 10:26 AM  RN Care Manager:x 07/18/2018 10:26 AM  Social Worker: Janice Norrie LCSW 07/18/2018 10:26 AM  Recreational Therapist: x 07/18/2018 10:26 AM  Other: Lindell Spar NP; Harriett Sine NP 07/18/2018 10:26 AM  Other:  07/18/2018 10:26 AM  Other: 07/18/2018 10:26 AM    Scribe for Treatment Team: Avelina Laine, LCSW 07/18/2018 10:26 AM

## 2018-07-18 NOTE — BHH Suicide Risk Assessment (Signed)
[facility] INPATIENT:  Family/Significant Other Suicide Prevention Education  Suicide Prevention Education:  Patient Refusal for Family/Significant Other Suicide Prevention Education: The patient Samuel Reyes has refused to provide written consent for family/significant other to be provided Family/Significant Other Suicide Prevention Education during admission and/or prior to discharge.  Physician notified.    SPE completed with pt, as pt refused to consent to family contact. SPI pamphlet provided to pt and pt was encouraged to share information with support network, ask questions, and talk about any concerns relating to SPE. Pt denies access to guns/firearms and verbalized understanding of information provided. Mobile Crisis information also provided to pt.   Avelina Laine LCSW 07/18/2018, 2:57 PM

## 2018-07-18 NOTE — Progress Notes (Signed)
Recreation Therapy Notes  Date: 12.20.19 Time: 0930 Location: 300 Hall Dayroom  Group Topic: Stress Management  Goal Area(s) Addresses:  Patient will verbalize importance of using healthy stress management.  Patient will identify positive emotions associated with healthy stress management.   Intervention: Stress Management  Activity :  Guided Imagery.  LRT introduced the stress management technique of guided imagery.  LRT read a script on sitting and observing the starry sky at night.  Patients were to follow along as script as read.  Education:  Stress Management, Discharge Planning.   Education Outcome: Acknowledges edcuation/In group clarification offered/Needs additional education  Clinical Observations/Feedback: Pt did not attend group.     [name], LRT/CTRS         [name] A 07/18/2018 11:57 AM

## 2018-07-18 NOTE — BH Assessment (Signed)
Tele Assessment Note   Patient Name: Samuel Reyes MRN: [id number] Referring Physician:  Location of Patient: [id number] Location of Provider: [facility]  Shawan Corella is an 62 y.o., widowed male. Pt presented to [facility] via EMS unaccompanied and voluntary. Pt brought to ED from [facility] due to having become unresponsive at the facility. Per report pt had ingested alcohol and was "passed out." Pt reports that he does not remember the events that happened prior to him coming to the hospital. Pt reports that he drinks 4 to 5 forty ounce beers in one sitting. Pt stated that he does not use any other substances. Pt reported that his wife died 3 years ago and he has 2 deceased children. Pt expressed unresolved grief, guilt and feelings of worthlessness. Pt stated, "I just wish I could go to sleep and not wake up." Pt denied AH/VH/SI/HI. Pt denied having a plan and intent to harm himself.   Pt reports that he is homeless. Pt reports that he receives SSI and Medicaid/Medicare. Pt reports that his wife died three years ago. Pt reports that his mother and sister live in [place]. Pt reports no current legal issues or probation. Pt reports a hx of neck and knee surgeries. Pt reports issues with standing due to knee problems.   Pt oriented to person. Pt presented alert, dressed in scrubs and poorly groomed. Pt spoke clearly, coherently and did not seem to be under the influence of any substances at the time of assessment. Pt made good eye contact and answered questions appropriately. Pt presented sullen with congruent mood, but pt was calm and open to the assessment process. Pt presented with impairments of recent memory.     Diagnosis: F10.229 Alcohol intoxication, With moderate or severe use disorder F33.1 Major depressive disorder, Recurrent episode, Moderate   Past Medical History:  Past Medical History:  Diagnosis Date  . Alcoholism ([place])   . CAD (coronary artery disease) 2002   MI, no  intervention required  . COPD (chronic obstructive pulmonary disease) (HCC)    not on home O2  . Depression   . GERD (gastroesophageal reflux disease)   . Headache   . Hypertension   . Stroke [facility]) 2006    Past Surgical History:  Procedure Laterality Date  . BACK SURGERY     3 cervical spine surgeries C4-C5 fused  . COLONOSCOPY N/A 01/04/2014   Procedure: COLONOSCOPY;  Surgeon: Danie Binder, MD;  Location: [facility];  Service: Endoscopy;  Laterality: N/A;  1:45  . ESOPHAGOGASTRODUODENOSCOPY N/A 01/04/2014   Procedure: ESOPHAGOGASTRODUODENOSCOPY (EGD);  Surgeon: Danie Binder, MD;  Location: [facility];  Service: Endoscopy;  Laterality: N/A;  . FINGER SURGERY Left    2nd, 3rd, & 4th fingers were cut off by table saw and reattached  . GASTRECTOMY    . HERNIA REPAIR    . INCISIONAL HERNIA REPAIR N/A 01/20/2014   Procedure: LAPAROSCOPIC RECURRENT  INCISIONAL HERNIA with mesh;  Surgeon: Edward Jolly, MD;  Location: [facility] ORS;  Service: General;  Laterality: N/A;  . rt knee arthroscopic surgery    . SHOULDER SURGERY Bilateral    3 surgeries on on left, 2 surgeries on right     Family History:  Family History  Problem Relation Age of Onset  . Cancer Father        bone  . Cancer Brother        lungs  . Stroke Maternal Grandmother   . Asthma Son  died at age 66 in his sleep   . Spina bifida Son        died at age 34   . Dementia Mother   . Colon cancer Neg Hx     Social History:  reports that he has been smoking cigarettes. He started smoking about 52 years ago. He has a 104.00 pack-year smoking history. He has never used smokeless tobacco. He reports current alcohol use of about 3.0 - 4.0 standard drinks of alcohol per week. He reports that he does not use drugs.  Additional Social History:  Alcohol / Drug Use Pain Medications: SEE MAR.  Prescriptions: Pt reports being prescribed Trazodone.  Over the Counter: SEE MAR.  History of alcohol / drug use?:  Yes Longest period of sobriety (when/how long): 7 Months  Negative Consequences of Use: Work / Youth worker, Charity fundraiser relationships, Museum/gallery curator, Scientist, research (physical sciences) Substance #1 Name of Substance 1: Alcohol  1 - Age of First Use: 19 1 - Amount (size/oz): 4 or 5 40oz Beers  1 - Frequency: Daily  1 - Duration: 40+ years 1 - Last Use / Amount: 07/17/2018  CIWA: CIWA-Ar BP: 132/88 Pulse Rate: 88 Nausea and Vomiting: mild nausea with no vomiting Tactile Disturbances: none Tremor: no tremor Auditory Disturbances: not present Paroxysmal Sweats: no sweat visible Visual Disturbances: not present Anxiety: mildly anxious Headache, Fullness in Head: moderate Agitation: normal activity Orientation and Clouding of Sensorium: oriented and can do serial additions CIWA-Ar Total: 5 COWS:    Allergies:  Allergies  Allergen Reactions  . Bee Venom Anaphylaxis  . Penicillins Rash    Has patient had a PCN reaction causing immediate rash, facial/tongue/throat swelling, SOB or lightheadedness with hypotension: {Yes Has patient had a PCN reaction causing severe rash involving mucus membranes or skin necrosis: YES Has patient had a PCN reaction that required hospitalization Yes Has patient had a PCN reaction occurring within the last 10 years: YES If all of the above answers are "NO", then may proceed with Cephalosporin use.     Home Medications: (Not in a hospital admission)   OB/GYN Status:  No LMP for male patient.  General Assessment Data Assessment unable to be completed: Yes Reason for not completing assessment: Pt has to be moved to a room or TTS.(Nurse, Patty informed clinician. ) Location of Assessment: [facility] ED TTS Assessment: In system Is this a Tele or Face-to-Face Assessment?: Tele Assessment Is this an Initial Assessment or a Re-assessment for this encounter?: Initial Assessment Patient Accompanied by:: N/A Language Other than English: No Living Arrangements: Homeless/Shelter(Pt reports staying at t  he [facility]. ) What gender do you identify as?: Male Marital status: Widowed [place] name: N/A Pregnancy Status: No Living Arrangements: Alone Can pt return to current living arrangement?: (Unsure at this time. ) Admission Status: Voluntary Is patient capable of signing voluntary admission?: Yes Referral Source: Self/Family/Friend Insurance type: Medicaid and Medicare  Medical Screening Exam ([place]) Medical Exam completed: Yes  Crisis Care Plan Living Arrangements: Alone Legal Guardian: Other:(Self) Name of Psychiatrist: Denied Name of Therapist: Denied  Education Status Is patient currently in school?: No Is the patient employed, unemployed or receiving disability?: Receiving disability income  Risk to self with the past 6 months Suicidal Ideation: No Has patient been a risk to self within the past 6 months prior to admission? : No Suicidal Intent: No Has patient had any suicidal intent within the past 6 months prior to admission? : No Is patient at risk for suicide?: No Suicidal Plan?:  No Has patient had any suicidal plan within the past 6 months prior to admission? : Yes Specify Current Suicidal Plan: Denied Access to Means: No Specify Access to Suicidal Means: N/A What has been your use of drugs/alcohol within the last 12 months?: Pt reports heavy alcohol use daily.  Previous Attempts/Gestures: Yes How many times?: 1 Other Self Harm Risks: Pt reports suicide attempt 10 years ago.  Triggers for Past Attempts: Spouse contact Intentional Self Injurious Behavior: None Family Suicide History: No Recent stressful life event(s): Other (Comment), Trauma (Comment)(Pt is homeless. Pt reports his children passed away. ) Persecutory voices/beliefs?: No Depression: Yes Depression Symptoms: Tearfulness, Feeling worthless/self pity, Loss of interest in usual pleasures, Guilt, Fatigue, Isolating, Despondent Substance abuse history and/or treatment for substance abuse?:  No Suicide prevention information given to non-admitted patients: Not applicable  Risk to Others within the past 6 months Homicidal Ideation: No Does patient have any lifetime risk of violence toward others beyond the six months prior to admission? : No Thoughts of Harm to Others: No Current Homicidal Intent: No Current Homicidal Plan: No Access to Homicidal Means: No Identified Victim: Denied History of harm to others?: No Assessment of Violence: None Noted Violent Behavior Description: N/A Does patient have access to weapons?: No Criminal Charges Pending?: No Does patient have a court date: No Is patient on probation?: No  Psychosis Hallucinations: None noted Delusions: None noted  Mental Status Report Appearance/Hygiene: Poor hygiene Eye Contact: Good Motor Activity: Freedom of movement Speech: Logical/coherent Level of Consciousness: Alert Mood: Worthless, low self-esteem Affect: Blunted Anxiety Level: None Thought Processes: Coherent, Relevant Judgement: Impaired Orientation: Person Obsessive Compulsive Thoughts/Behaviors: None  Cognitive Functioning Concentration: Normal Memory: Remote Intact, Recent Impaired Is patient IDD: No Insight: Good Impulse Control: Poor Appetite: Fair Have you had any weight changes? : No Change Sleep: No Change Total Hours of Sleep: 4 Vegetative Symptoms: None  ADLScreening [facility] Assessment Services) Patient's cognitive ability adequate to safely complete daily activities?: Yes Patient able to express need for assistance with ADLs?: Yes Independently performs ADLs?: Yes (appropriate for developmental age)  Prior Inpatient Therapy Prior Inpatient Therapy: Yes Prior Therapy Dates: 2019, 2017, 2016 Prior Therapy Facilty/Provider(s): [facility] Reason for Treatment: ALCOHOL DEPENDENCE, MDD  Prior Outpatient Therapy Prior Outpatient Therapy: Yes Prior Therapy Dates: 2018 Prior Therapy Facilty/Provider(s): [facility] Reason for  Treatment: MED MANAGEMENT Does patient have an ACCT team?: No Does patient have Intensive In-House Services?  : No Does patient have [facility] services? : No Does patient have P4CC services?: No  ADL Screening (condition at time of admission) Patient's cognitive ability adequate to safely complete daily activities?: Yes Is the patient deaf or have difficulty hearing?: No Does the patient have difficulty seeing, even when wearing glasses/contacts?: No Does the patient have difficulty concentrating, remembering, or making decisions?: No Patient able to express need for assistance with ADLs?: Yes Does the patient have difficulty dressing or bathing?: No Independently performs ADLs?: Yes (appropriate for developmental age) Does the patient have difficulty walking or climbing stairs?: No Weakness of Legs: Both(Pt reports hx of two surgeries. ) Weakness of Arms/Hands: None  Home Assistive Devices/Equipment Home Assistive Devices/Equipment: Cane (specify quad or straight)(Pt reports that he is not sure where his cane is. )  Therapy Consults (therapy consults require a physician order) PT Evaluation Needed: No OT Evalulation Needed: No SLP Evaluation Needed: No Abuse/Neglect Assessment (Assessment to be complete while patient is alone) Abuse/Neglect Assessment Can Be Completed: Yes Physical Abuse: Denies Verbal Abuse: Denies Sexual Abuse:  Denies Exploitation of patient/patient's resources: Denies Self-Neglect: Denies Values / Beliefs Cultural Requests During Hospitalization: None Spiritual Requests During Hospitalization: None Consults Spiritual Care Consult Needed: No Social Work Consult Needed: No Regulatory affairs officer (For Healthcare) Does Patient Have a Medical Advance Directive?: No Would patient like information on creating a medical advance directive?: No - Patient declined          Disposition: Per Patriciaann Clan; PA; Pt meets criteria for inpatient treatment due to dual  diagnosis. AC, [place] informed of pt disposition. Pt admit to [facility] Bed 2.  Disposition Initial Assessment Completed for this Encounter: Yes Patient referred to: Charna Archer, [place] informed of pt disposition. )  This service was provided via telemedicine using a 2-way, interactive audio and Radiographer, therapeutic.  Names of all persons participating in this telemedicine service and their role in this encounter. Name: Arshan Jabs Role: Patient   Name: Jannet Askew  Role: Clinician   Name:  Role:   Name:  Role:    Jannet Askew, M.S., [facility] [place] Triage Specialist [facility] 07/18/2018 2:12 AM

## 2018-07-18 NOTE — ED Notes (Signed)
Patient belongings inventoried and placed in locker number 3.  Valuables sent to security.  No home medications to send to pharmacy

## 2018-07-18 NOTE — BHH Suicide Risk Assessment (Signed)
[facility] Admission Suicide Risk Assessment   Nursing information obtained from:  Patient Demographic factors:  Male, Adolescent or young adult Current Mental Status:  NA Loss Factors:  Loss of significant relationship, Decline in physical health, Financial problems / change in socioeconomic status Historical Factors:  Anniversary of important loss, Family history of mental illness or substance abuse Risk Reduction Factors:  NA  Total Time spent with patient: 45 minutes Principal Problem: Presenting with alcohol induced stupor then once aroused endorsing depression and passive suicidal thoughts Diagnosis:  Active Problems:   MDD (major depressive disorder), recurrent episode, severe ([place])  Subjective Data: Chronic reports of suicidal thinking related to social situation/chemical dependency/recurrent depression  Continued Clinical Symptoms:    The "Alcohol Use Disorders Identification Test", Guidelines for Use in Primary Care, Second Edition.  World [employer] [facility]). Score between 0-7:  no or low risk or alcohol related problems. Score between 8-15:  moderate risk of alcohol related problems. Score between 16-19:  high risk of alcohol related problems. Score 20 or above:  warrants further diagnostic evaluation for alcohol dependence and treatment.   CLINICAL FACTORS:   Depression:   Anhedonia Alcohol/Substance Abuse/Dependencies       COGNITIVE FEATURES THAT CONTRIBUTE TO RISK:  Polarized thinking    SUICIDE RISK:   Mild:  Suicidal ideation of limited frequency, intensity, duration, and specificity.  There are no identifiable plans, no associated intent, mild dysphoria and related symptoms, good self-control (both objective and subjective assessment), few other risk factors, and identifiable protective factors, including available and accessible social support.  PLAN OF CARE: Detox, evaluate tachycardia, treat with antidepressants cognitive and rehab based therapy, seek  rehab options  I certify that inpatient services furnished can reasonably be expected to improve the patient's condition.   Johnn Hai, MD 07/18/2018, 8:56 AM

## 2018-07-18 NOTE — Progress Notes (Signed)
Pt admitted to [facility] Bed 2.  Arrive after 4am. Dr. Mallie Darting attending. Call report to [phone]  Nurse, Mitzi Hansen informed of pt disposition and acceptance.

## 2018-07-18 NOTE — Tx Team (Signed)
Initial Treatment Plan 07/18/2018 6:20 AM Scherrie Merritts ZLD:[id number]    PATIENT STRESSORS: Medication change or noncompliance Occupational concerns Substance abuse   PATIENT STRENGTHS: Ability for insight Active sense of humor   PATIENT IDENTIFIED PROBLEMS: "Alcohol detox"  "rehabilitation"                   DISCHARGE CRITERIA:  Ability to meet basic life and health needs Adequate post-discharge living arrangements Improved stabilization in mood, thinking, and/or behavior  PRELIMINARY DISCHARGE PLAN: Attend aftercare/continuing care group Attend 12-step recovery group Participate in family therapy  PATIENT/FAMILY INVOLVEMENT: This treatment plan has been presented to and reviewed with the patient, Samuel Reyes.  The patient and family have been given the opportunity to ask questions and make suggestions.  Gwendolyn Fill, RN 07/18/2018, 6:20 AM

## 2018-07-18 NOTE — ED Provider Notes (Signed)
5:10 AM  Pt transferred to [facility].   Beauden Tremont, Delice Bison, DO 07/18/18 [phone]

## 2018-07-18 NOTE — Progress Notes (Addendum)
D Pt is observed standing at the nurses' station after lunchl HE is speaking with his Education officer, museum. He remains slightly nauseous ( he states the zoofran he took this morning at shift change DID help him), he is unkempt, he wears hospital-issue patient scrubs and he is unshaven .     A HE admits to this writer that " I feel rough" . He is given prn zofran ( for c/o nausea ) and he is given diet gingerale in a pitcher. He is encouraged to focus on  maintianing his hydration status AMAP .     R Safety is in place

## 2018-07-18 NOTE — Progress Notes (Signed)
Patient presents with anxious/apathetic/slow affect and behavior during admission interview and assessment. VS monitored and recorded. Pt heart rate tachycardic.  Provider informed. No new orders written. Skin check performed with Josefina Do and revealed multiple surgical scars to arms and trunk and multiple tattoos. Contraband was not found. Patient was oriented to unit and schedule. Pt states "I need a place to stay.". Pt denies SI/HI/AVH at this time. PO fluids provided. Safety maintained. Rest encouraged.
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
## 2018-07-19 ENCOUNTER — Encounter (HOSPITAL_COMMUNITY): Payer: Self-pay | Admitting: Clinical

## 2018-07-19 DIAGNOSIS — F332 Major depressive disorder, recurrent severe without psychotic features: Principal | ICD-10-CM

## 2018-07-19 DIAGNOSIS — G4700 Insomnia, unspecified: Secondary | ICD-10-CM

## 2018-07-19 MED ORDER — TIOTROPIUM BROMIDE MONOHYDRATE 18 MCG IN CAPS
18.0000 ug | ORAL_CAPSULE | Freq: Every day | RESPIRATORY_TRACT | Status: DC
  Administered 2018-07-20 – 2018-07-22 (×3): 18 ug via RESPIRATORY_TRACT
  Filled 2018-07-19: qty 5

## 2018-07-19 MED ORDER — HYDROCORTISONE 1 % EX CREA
TOPICAL_CREAM | Freq: Three times a day (TID) | CUTANEOUS | Status: DC | PRN
  Administered 2018-07-20: 1 via TOPICAL
  Administered 2018-07-22: 08:00:00 via TOPICAL
  Filled 2018-07-19: qty 28

## 2018-07-19 MED ORDER — ALBUTEROL SULFATE HFA 108 (90 BASE) MCG/ACT IN AERS
2.0000 | INHALATION_SPRAY | RESPIRATORY_TRACT | Status: DC | PRN
  Administered 2018-07-19 – 2018-07-22 (×2): 2 via RESPIRATORY_TRACT

## 2018-07-19 MED ORDER — ALBUTEROL SULFATE HFA 108 (90 BASE) MCG/ACT IN AERS
INHALATION_SPRAY | RESPIRATORY_TRACT | Status: AC
  Filled 2018-07-19: qty 6.7

## 2018-07-19 NOTE — Progress Notes (Signed)
Adult Psychoeducational Group Note  Date:  07/19/2018 Time: 8:45am-10:00am Group Topic/Focus:  Goals Group:   The focus of this group is to help patients establish daily goals to achieve during treatment and discuss how the patient can incorporate goal setting into their daily lives to aide in recovery.  Participation Level:  Active  Participation Quality:  Appropriate and Attentive  Affect:  Appropriate  Cognitive:  Alert and Appropriate  Insight: Appropriate and Good  Engagement in Group:  Engaged and Supportive  Modes of Intervention:  Discussion and Support  Additional Comments: Patient informed the group that his goal for the day is to try not to think about the past. Patient informed the group that he has buried two sons and a wife.  Pt informed the group that he has no family and has had 27 surgeries. Pt expressed feeling like his body is failing and reports that nothing helps. Patient expressed feeling like he is just ready to lay down and be done. Pt informed the group that he is looking forward to having less pain.   Doyle Askew 07/19/2018, 11:47 AM

## 2018-07-19 NOTE — BHH Counselor (Signed)
Clinical Social Work Note  Met with pt to do his Psychosocial Assessment, but found it was already done.  He was informed about referrals to rehab facilities in [place] and [place], acted unaware.  He stated he has no outpatient behavioral health providers and does not like his medical providers ("they can bite me"), so was informed of new medical practice that could provide medical, mental health/substance abuse, and social care assistance.  He was interested and said he would rather stay local to be near his mother rather than go to rehab in another state, but CSW reminded him of the self-described severity of his alcoholism and the advisability of rehab.  He can be referred to [place] if he chooses.  Selmer Dominion, LCSW 07/19/2018, 11:40 AM

## 2018-07-19 NOTE — Progress Notes (Signed)
[facility] MD Progress Note  07/19/2018 12:14 PM Samuel Reyes  MRN:  [id number]   Evaluation:  [name] observed resting in bed.  He is awake alert and oriented x3.  Continues to have concerns with pain related to his neck surgery.  Reports a history of degenerative joint disease.  rates his depression 8 out of 10 with 10 being the worst.  Patient reports passive suicidal ideations however denies plan or intent. "  I just want to go to sleep and not wake up."  Patient reports ports she was able to follow-up with the social worker and start his discharge disposition to a residential facility however is unable to recall the name.  Reports a fair appetite.  Patient reports some issues staying asleep.  Patient was initiated on Ativan detox protocol.  Patient to continue Celexa 20 mg p.o. daily.  Support encouragement reassurance was provided.   History:Per MD assessment note on admission:  Samuel Reyes is known to the service he is a 62 year old homeless individual with a history of hypertension, alcohol dependency and cocaine abuse, COPD, coronary disease, and he presented through the emergency department on 12/19 in the morning hours, with mental status changes.  His blood alcohol level was noted to be 352.Patient was staying at the [facility] facility and staff noted he was unconscious while seated in a chair and EMS was called. He did arouse but again it was clear he was intoxicated he was brought in for further evaluation and was medically cleared.Patient states he lives on the streets, there are times he wishes he would go to sleep and not wake up, states he would like some rehab again.  [facility] had the [facility].  States he would need some detox measures as he is drinking daily a variable amount but obviously drinking to the point of intoxication.    Principal Problem: MDD (major depressive disorder), recurrent episode, severe ([place]) Diagnosis: Principal Problem:   MDD (major  depressive disorder), recurrent episode, severe ([place])  Total Time spent with patient: 15 minutes  Past Psychiatric History: Previous inpatient admissions  Past Medical History:  Past Medical History:  Diagnosis Date  . Alcoholism ([place])   . CAD (coronary artery disease) 2002   MI, no intervention required  . COPD (chronic obstructive pulmonary disease) (HCC)    not on home O2  . Depression   . GERD (gastroesophageal reflux disease)   . Headache   . Hypertension   . Stroke [facility]) 2006    Past Surgical History:  Procedure Laterality Date  . BACK SURGERY     3 cervical spine surgeries C4-C5 fused  . COLONOSCOPY N/A 01/04/2014   Procedure: COLONOSCOPY;  Surgeon: Danie Binder, MD;  Location: [facility];  Service: Endoscopy;  Laterality: N/A;  1:45  . ESOPHAGOGASTRODUODENOSCOPY N/A 01/04/2014   Procedure: ESOPHAGOGASTRODUODENOSCOPY (EGD);  Surgeon: Danie Binder, MD;  Location: [facility];  Service: Endoscopy;  Laterality: N/A;  . FINGER SURGERY Left    2nd, 3rd, & 4th fingers were cut off by table saw and reattached  . GASTRECTOMY    . HERNIA REPAIR    . INCISIONAL HERNIA REPAIR N/A 01/20/2014   Procedure: LAPAROSCOPIC RECURRENT  INCISIONAL HERNIA with mesh;  Surgeon: Edward Jolly, MD;  Location: [facility] ORS;  Service: General;  Laterality: N/A;  . rt knee arthroscopic surgery    . SHOULDER SURGERY Bilateral    3 surgeries on on left, 2 surgeries on right  Family History:  Family History  Problem Relation Age of Onset  . Cancer Father        bone  . Cancer Brother        lungs  . Stroke Maternal Grandmother   . Asthma Son        died at age 52 in his sleep   . Spina bifida Son        died at age 86   . Dementia Mother   . Colon cancer Neg Hx    Family Psychiatric  History:  Social History:  Social History   Substance and Sexual Activity  Alcohol Use Yes  . Alcohol/week: 3.0 - 4.0 standard drinks  . Types: 3 - 4 Cans of beer per week   Comment: everyday-  3-4 40 oz     Social History   Substance and Sexual Activity  Drug Use No   Comment: denied using any drugs    Social History   Socioeconomic History  . Marital status: Widowed    Spouse name: Not on file  . Number of children: 3  . Years of education: Not on file  . Highest education level: Not on file  Occupational History  . Occupation: Disability  Social Needs  . Financial resource strain: Not on file  . Food insecurity:    Worry: Not on file    Inability: Not on file  . Transportation needs:    Medical: Yes    Non-medical: Yes  Tobacco Use  . Smoking status: Current Every Day Smoker    Packs/day: 2.00    Years: 52.00    Pack years: 104.00    Types: Cigarettes    Start date: 07/30/1966  . Smokeless tobacco: Never Used  . Tobacco comment: down to 1/4 ppd  Substance and Sexual Activity  . Alcohol use: Yes    Alcohol/week: 3.0 - 4.0 standard drinks    Types: 3 - 4 Cans of beer per week    Comment: everyday- 3-4 40 oz  . Drug use: No    Comment: denied using any drugs  . Sexual activity: Never  Lifestyle  . Physical activity:    Days per week: Not on file    Minutes per session: Not on file  . Stress: Not on file  Relationships  . Social connections:    Talks on phone: Not on file    Gets together: Not on file    Attends religious service: Not on file    Active member of club or organization: Not on file    Attends meetings of clubs or organizations: Not on file    Relationship status: Not on file  Other Topics Concern  . Not on file  Social History Narrative  . Not on file   Additional Social History:                         Sleep: Fair  Appetite:  Fair  Current Medications: Current Facility-Administered Medications  Medication Dose Route Frequency Provider Last Rate Last Dose  . acetaminophen (TYLENOL) tablet 650 mg  650 mg Oral Q6H PRN Laverle Hobby, PA-C   650 mg at 07/19/18 7253  . alum & mag hydroxide-simeth (MAALOX/MYLANTA)  200-200-20 MG/5ML suspension 30 mL  30 mL Oral Q4H PRN Patriciaann Clan E, PA-C      . citalopram (CELEXA) tablet 20 mg  20 mg Oral Daily Johnn Hai, MD   20 mg at 07/19/18 0834  .  hydrOXYzine (ATARAX/VISTARIL) tablet 25 mg  25 mg Oral Q6H PRN Patriciaann Clan E, PA-C      . loperamide (IMODIUM) capsule 2-4 mg  2-4 mg Oral PRN Laverle Hobby, PA-C      . LORazepam (ATIVAN) tablet 1 mg  1 mg Oral Q6H PRN Laverle Hobby, PA-C      . LORazepam (ATIVAN) tablet 1 mg  1 mg Oral TID Patriciaann Clan E, PA-C   1 mg at 07/19/18 3329   Followed by  . [START ON 07/20/2018] LORazepam (ATIVAN) tablet 1 mg  1 mg Oral BID Patriciaann Clan E, PA-C       Followed by  . [START ON 07/21/2018] LORazepam (ATIVAN) tablet 1 mg  1 mg Oral Daily Simon, Spencer E, PA-C      . magnesium hydroxide (MILK OF MAGNESIA) suspension 30 mL  30 mL Oral Daily PRN Laverle Hobby, PA-C      . multivitamin with minerals tablet 1 tablet  1 tablet Oral Daily Laverle Hobby, PA-C   1 tablet at 07/19/18 0834  . ondansetron (ZOFRAN-ODT) disintegrating tablet 4 mg  4 mg Oral Q6H PRN Laverle Hobby, PA-C   4 mg at 07/18/18 5188  . propranolol (INDERAL) tablet 10 mg  10 mg Oral TID Johnn Hai, MD   10 mg at 07/19/18 0835  . thiamine (B-1) injection 100 mg  100 mg Intramuscular Once Patriciaann Clan E, PA-C      . thiamine (VITAMIN B-1) tablet 100 mg  100 mg Oral Daily Patriciaann Clan E, PA-C   100 mg at 07/19/18 0835  . traZODone (DESYREL) tablet 100 mg  100 mg Oral QHS,MR X 1 Laverle Hobby, PA-C        Lab Results:  Results for orders placed or performed during the hospital encounter of 07/18/18 (from the past 48 hour(s))  TSH     Status: None   Collection Time: 07/18/18  7:11 PM  Result Value Ref Range   TSH 1.048 0.350 - 4.500 uIU/mL    Comment: Performed by a 3rd Generation assay with a functional sensitivity of <=0.01 uIU/mL. Performed at [facility], [place] [address] [place], [place] [address]     Blood  Alcohol level:  Lab Results  Component Value Date   ETH 352 [facility]) 07/17/2018   ETH 337 (HH) 63/07/6008    Metabolic Disorder Labs: Lab Results  Component Value Date   HGBA1C 5.4 06/26/2017   MPG 108 06/26/2017   MPG 105 08/03/2016   No results found for: PROLACTIN Lab Results  Component Value Date   CHOL 247 (H) 02/17/2013   TRIG 144 02/17/2013   HDL 79 02/17/2013   CHOLHDL 3.1 02/17/2013   VLDL 29 02/17/2013   LDLCALC 139 (H) 02/17/2013    Physical Findings: AIMS: Facial and Oral Movements Muscles of Facial Expression: None, normal Lips and Perioral Area: None, normal Tongue: None, normal,Extremity Movements Upper (arms, wrists, hands, fingers): None, normal Lower (legs, knees, ankles, toes): None, normal, Trunk Movements Neck, shoulders, hips: None, normal, Overall Severity Severity of abnormal movements (highest score from questions above): None, normal Incapacitation due to abnormal movements: None, normal Patient's awareness of abnormal movements (rate only patient's report): No Awareness, Dental Status Current problems with teeth and/or dentures?: No Does patient usually wear dentures?: No  CIWA:  CIWA-Ar Total: 1 COWS:     Musculoskeletal: Strength & Muscle Tone: within normal limits Gait & Station: normal Patient leans: N/A  Psychiatric  Specialty Exam: Physical Exam  Nursing note and vitals reviewed. Constitutional: He appears well-developed.  Skin: Skin is warm.  Psychiatric: He has a normal mood and affect. His behavior is normal.    Review of Systems  Musculoskeletal: Positive for neck pain.  Psychiatric/Behavioral: Positive for depression. Negative for suicidal ideas (passive ideation denies plan or intent). The patient has insomnia.   All other systems reviewed and are negative.   Blood pressure 137/90, pulse 81, temperature 99 F (37.2 C), temperature source Oral, resp. rate 20, height 5\' 5"  (1.651 m), weight 66 kg, SpO2 98 %.Body mass index is  24.21 kg/m.  General Appearance: Casual  Eye Contact:  Good  Speech:  Clear and Coherent  Volume:  Normal  Mood:  Anxious and Depressed  Affect:  Congruent and Depressed  Thought Process:  Coherent  Orientation:  Full (Time, Place, and Person)  Thought Content:  WDL  Suicidal Thoughts:  No passive ideations   Homicidal Thoughts:  No  Memory:  Immediate;   Fair Recent;   Fair  Judgement:  Fair  Insight:  Fair  Psychomotor Activity:  Normal  Concentration:  Concentration: Fair  Recall:  [employer] of Knowledge:  Fair  Language:  Fair  Akathisia:  No  Handed:  Right  AIMS (if indicated):     Assets:  Communication Skills Desire for Improvement Resilience Social Support  ADL's:  Intact  Cognition:  WNL  Sleep:  Number of Hours: 6.5     Treatment Plan Summary: Daily contact with patient to assess and evaluate symptoms and progress in treatment and Medication management   Continue her current treatment plan on 07/19/2018 as listed below except were noted  MDD:  Continue Celexa 20 mg p.o. daily  Insomnia:  Continue trazodone 100 mg p.o. nightly as needed  Alcohol detox:  Continue Ativan protocol/ CIWA  CSW to continue working on discharge disposition Patient encouraged to participate with therapeutic milieu    Derrill Center, NP 07/19/2018, 12:14 PM

## 2018-07-19 NOTE — Progress Notes (Signed)
Psychoeducational Group Note  Date:  07/19/2018 Time:  2228  Group Topic/Focus:  Wrap-Up Group:   The focus of this group is to help patients review their daily goal of treatment and discuss progress on daily workbooks.  Participation Level: Did Not Attend  Participation Quality:  Not Applicable  Affect:  Not Applicable  Cognitive:  Not Applicable  Insight:  Not Applicable  Engagement in Group: Not Applicable  Additional Comments:  The patient did not attend group since he was asleep in his bed.   Archie Balboa S 07/19/2018, 10:28 PM

## 2018-07-19 NOTE — [facility] Group Notes (Signed)
LCSW Group Therapy Note  07/19/2018    10:00-11:00am   Type of Therapy and Topic:  Group Therapy: Shame and Its Impact   Participation Level:  Active   Description of Group:   In this group, patients shared and discussed that guilt is the negative feeling we have when we've done something wrong, while shame is the negative feeling we have simply about "being."  In listening to each other share, patients learned that humans are all imperfect and that there is no shame in this.  We discussed how it could positively impact our wellbeing by accepting our faults as part of our being that can be worked on but does not have to shame [place].    Therapeutic Goals: 1. Patients will learn the difference between guilt and shame. 2. Patients will share their current shame feelings and how this has impacted their current lives. 3. Patients will explore possible ways to think differently about those parts of their bodies, feelings, and lives about which they do have shame. 4. Patients will learn that shame is universal, and that keeping our shame a secret actually increases its hold on [place].  Summary of Patient Progress:  The patient shared that he feels shame about his medical issues.  This is of concern because he feels they came from working so hard for many years, and is in contrast to the need he actually had in all those years to work hard to provide, so it could not be helped.  He stated his life has been "in the toilet" for years, and his baby son died 79 years ago, his 16yo son 6 years ago, his wife 3 years ago, and he has also lost his father and brother.  He is homeless and cannot tolerate being outside any longer.  While he is on disability he does not have a home currently and needs help finding one..  Therapeutic Modalities:   Cognitive Behavioral Therapy Motivation Interviewing  Maretta Los  .

## 2018-07-20 NOTE — Progress Notes (Signed)
[facility] ([facility]) MD Progress Note  07/20/2018 12:11 PM Samuel Reyes  MRN:  [id number]   Evaluation:  Samuel Reyes observed sitting at the nursing stations as patient reports " I am feeling dizzy." patient reports is always has had episodes of dizziness and ring in his ear. Denies that he has been evaluated for symptoms. Patient reports nausea and vomiting that started yesterday. Patient denied any other associated symptoms. Patient denied suicidal or homicidal ideations. Etoh on admission was 389. Continue to monitor withdrawal symptoms. Reports headaches. Slow and steady gait noted.    Rates his depression 8/10 with 10 being the worst. Patient to continue medications as prescribed.  Samuel Reyes has concerns with chronic pain discomfort. Patient encouraged to increased hydration, Rn reports treating reported symptoms. Patient was initiated on Ativan detox protocol.  Patient to continue Celexa 20 mg p.o. daily.  Support encouragement reassurance was provided.   History:Per MD assessment note on admission:  Samuel Reyes is known to the service he is a 62 year old homeless individual with a history of hypertension, alcohol dependency and cocaine abuse, COPD, coronary disease, and he presented through the emergency department on 12/19 in the morning hours, with mental status changes.  His blood alcohol level was noted to be 352.Patient was staying at the [facility] facility and staff noted he was unconscious while seated in a chair and EMS was called. He did arouse but again it was clear he was intoxicated he was brought in for further evaluation and was medically cleared.Patient states he lives on the streets, there are times he wishes he would go to sleep and not wake up, states he would like some rehab again.  [facility] had the [facility].  States he would need some detox measures as he is drinking daily a variable amount but obviously drinking to the point of intoxication.    Principal Problem:  MDD (major depressive disorder), recurrent episode, severe ([place]) Diagnosis: Principal Problem:   MDD (major depressive disorder), recurrent episode, severe ([place])  Total Time spent with patient: 15 minutes  Past Psychiatric History: Previous inpatient admissions  Past Medical History:  Past Medical History:  Diagnosis Date  . Alcoholism ([place])   . CAD (coronary artery disease) 2002   MI, no intervention required  . COPD (chronic obstructive pulmonary disease) (H[facility])    not on home O2  . Depression   . GERD (gastroesophageal reflux disease)   . Headache   . Hypertension   . Stroke [facility]) 2006    Past Surgical History:  Procedure Laterality Date  . BACK SURGERY     3 cervical spine surgeries C4-C5 fused  . COLONOSCOPY N/A 01/04/2014   Procedure: COLONOSCOPY;  Surgeon: Danie Binder, MD;  Location: [facility];  Service: Endoscopy;  Laterality: N/A;  1:45  . ESOPHAGOGASTRODUODENOSCOPY N/A 01/04/2014   Procedure: ESOPHAGOGASTRODUODENOSCOPY (EGD);  Surgeon: Danie Binder, MD;  Location: [facility];  Service: Endoscopy;  Laterality: N/A;  . FINGER SURGERY Left    2nd, 3rd, & 4th fingers were cut off by table saw and reattached  . GASTRECTOMY    . HERNIA REPAIR    . INCISIONAL HERNIA REPAIR N/A 01/20/2014   Procedure: LAPAROSCOPIC RECURRENT  INCISIONAL HERNIA with mesh;  Surgeon: Edward Jolly, MD;  Location: [facility] ORS;  Service: General;  Laterality: N/A;  . rt knee arthroscopic surgery    . SHOULDER SURGERY Bilateral    3 surgeries on on left, 2 surgeries on right  Family History:  Family History  Problem Relation Age of Onset  . Cancer Father        bone  . Cancer Brother        lungs  . Stroke Maternal Grandmother   . Asthma Son        died at age 52 in his sleep   . Spina bifida Son        died at age 64   . Dementia Mother   . Colon cancer Neg Hx    Family Psychiatric  History:  Social History:  Social History   Substance and Sexual Activity  Alcohol Use  Yes  . Alcohol/week: 3.0 - 4.0 standard drinks  . Types: 3 - 4 Cans of beer per week   Comment: everyday- 3-4 40 oz     Social History   Substance and Sexual Activity  Drug Use No   Comment: denied using any drugs    Social History   Socioeconomic History  . Marital status: Widowed    Spouse name: Not on file  . Number of children: 3  . Years of education: Not on file  . Highest education level: Not on file  Occupational History  . Occupation: Disability  Social Needs  . Financial resource strain: Not on file  . Food insecurity:    Worry: Not on file    Inability: Not on file  . Transportation needs:    Medical: Yes    Non-medical: Yes  Tobacco Use  . Smoking status: Current Every Day Smoker    Packs/day: 2.00    Years: 52.00    Pack years: 104.00    Types: Cigarettes    Start date: 07/30/1966  . Smokeless tobacco: Never Used  . Tobacco comment: down to 1/4 ppd  Substance and Sexual Activity  . Alcohol use: Yes    Alcohol/week: 3.0 - 4.0 standard drinks    Types: 3 - 4 Cans of beer per week    Comment: everyday- 3-4 40 oz  . Drug use: No    Comment: denied using any drugs  . Sexual activity: Never  Lifestyle  . Physical activity:    Days per week: Not on file    Minutes per session: Not on file  . Stress: Not on file  Relationships  . Social connections:    Talks on phone: Not on file    Gets together: Not on file    Attends religious service: Not on file    Active member of club or organization: Not on file    Attends meetings of clubs or organizations: Not on file    Relationship status: Not on file  Other Topics Concern  . Not on file  Social History Narrative  . Not on file   Additional Social History:                         Sleep: Fair  Appetite:  Fair  Current Medications: Current Facility-Administered Medications  Medication Dose Route Frequency Provider Last Rate Last Dose  . acetaminophen (TYLENOL) tablet 650 mg  650 mg Oral  Q6H PRN Laverle Hobby, PA-C   650 mg at 07/19/18 9371  . albuterol (PROVENTIL HFA;VENTOLIN HFA) 108 (90 Base) MCG/ACT inhaler 2 puff  2 puff Inhalation Q4H PRN Lindon Romp A, NP   2 puff at 07/19/18 2048  . alum & mag hydroxide-simeth (MAALOX/MYLANTA) 200-200-20 MG/5ML suspension 30 mL  30 mL Oral Q4H PRN  Laverle Hobby, PA-C      . citalopram (CELEXA) tablet 20 mg  20 mg Oral Daily Johnn Hai, MD   20 mg at 07/20/18 0856  . hydrocortisone cream 1 %   Topical TID PRN Lindon Romp A, NP      . hydrOXYzine (ATARAX/VISTARIL) tablet 25 mg  25 mg Oral Q6H PRN Laverle Hobby, PA-C   25 mg at 07/19/18 2048  . loperamide (IMODIUM) capsule 2-4 mg  2-4 mg Oral PRN Laverle Hobby, PA-C      . LORazepam (ATIVAN) tablet 1 mg  1 mg Oral Q6H PRN Laverle Hobby, PA-C      . LORazepam (ATIVAN) tablet 1 mg  1 mg Oral BID Laverle Hobby, PA-C   1 mg at 07/20/18 4081   Followed by  . [START ON 07/21/2018] LORazepam (ATIVAN) tablet 1 mg  1 mg Oral Daily Simon, Spencer E, PA-C      . magnesium hydroxide (MILK OF MAGNESIA) suspension 30 mL  30 mL Oral Daily PRN Laverle Hobby, PA-C      . multivitamin with minerals tablet 1 tablet  1 tablet Oral Daily Laverle Hobby, PA-C   1 tablet at 07/20/18 0855  . ondansetron (ZOFRAN-ODT) disintegrating tablet 4 mg  4 mg Oral Q6H PRN Laverle Hobby, PA-C   4 mg at 07/20/18 0856  . propranolol (INDERAL) tablet 10 mg  10 mg Oral TID Johnn Hai, MD   10 mg at 07/20/18 0855  . thiamine (B-1) injection 100 mg  100 mg Intramuscular Once Patriciaann Clan E, PA-C      . thiamine (VITAMIN B-1) tablet 100 mg  100 mg Oral Daily Patriciaann Clan E, PA-C   100 mg at 07/20/18 0855  . tiotropium (SPIRIVA) inhalation capsule ([facility] use ONLY) 18 mcg  18 mcg Inhalation Daily Lindon Romp A, NP   18 mcg at 07/20/18 0908  . traZODone (DESYREL) tablet 100 mg  100 mg Oral QHS,MR X 1 Laverle Hobby, PA-C   100 mg at 07/19/18 2226    Lab Results:  Results for orders placed or  performed during the hospital encounter of 07/18/18 (from the past 48 hour(s))  TSH     Status: None   Collection Time: 07/18/18  7:11 PM  Result Value Ref Range   TSH 1.048 0.350 - 4.500 uIU/mL    Comment: Performed by a 3rd Generation assay with a functional sensitivity of <=0.01 uIU/mL. Performed at [facility], [place] [address] [place], [place] [address]     Blood Alcohol level:  Lab Results  Component Value Date   ETH 352 [facility]) 07/17/2018   ETH 337 (HH) 56/31/4970    Metabolic Disorder Labs: Lab Results  Component Value Date   HGBA1C 5.4 06/26/2017   MPG 108 06/26/2017   MPG 105 08/03/2016   No results found for: PROLACTIN Lab Results  Component Value Date   CHOL 247 (H) 02/17/2013   TRIG 144 02/17/2013   HDL 79 02/17/2013   CHOLHDL 3.1 02/17/2013   VLDL 29 02/17/2013   LDLCALC 139 (H) 02/17/2013    Physical Findings: AIMS: Facial and Oral Movements Muscles of Facial Expression: None, normal Lips and Perioral Area: None, normal Tongue: None, normal,Extremity Movements Upper (arms, wrists, hands, fingers): None, normal Lower (legs, knees, ankles, toes): None, normal, Trunk Movements Neck, shoulders, hips: None, normal, Overall Severity Severity of abnormal movements (highest score from questions above): None, normal Incapacitation due to abnormal  movements: None, normal Patient's awareness of abnormal movements (rate only patient's report): No Awareness, Dental Status Current problems with teeth and/or dentures?: No Does patient usually wear dentures?: No  CIWA:  CIWA-Ar Total: 0 COWS:     Musculoskeletal: Strength & Muscle Tone: within normal limits Gait & Station: normal Patient leans: N/A  Psychiatric Specialty Exam: Physical Exam  Nursing note and vitals reviewed. Constitutional: He appears well-developed.  Skin: Skin is warm.  Psychiatric: He has a normal mood and affect. His behavior is normal.    Review of Systems   Musculoskeletal: Positive for neck pain.  Psychiatric/Behavioral: Positive for depression. Negative for suicidal ideas (passive ideation denies plan or intent). The patient has insomnia.   All other systems reviewed and are negative.   Blood pressure 107/81, pulse 98, temperature (!) 97.5 F (36.4 C), temperature source Oral, resp. rate 20, height 5\' 5"  (1.651 m), weight 66 kg, SpO2 97 %.Body mass index is 24.21 kg/m.  General Appearance: Casual  Eye Contact:  Good  Speech:  Clear and Coherent  Volume:  Normal  Mood:  Anxious and Depressed  Affect:  Congruent and Depressed  Thought Process:  Coherent  Orientation:  Full (Time, Place, and Person)  Thought Content:  WDL  Suicidal Thoughts:  No passive ideations   Homicidal Thoughts:  No  Memory:  Immediate;   Fair Recent;   Fair  Judgement:  Fair  Insight:  Fair  Psychomotor Activity:  Normal  Concentration:  Concentration: Fair  Recall:  [employer] of Knowledge:  Fair  Language:  Fair  Akathisia:  No  Handed:  Right  AIMS (if indicated):     Assets:  Communication Skills Desire for Improvement Resilience Social Support  ADL's:  Intact  Cognition:  WNL  Sleep:  Number of Hours: 6.5     Treatment Plan Summary: Daily contact with patient to assess and evaluate symptoms and progress in treatment and Medication management   Continue her current treatment plan on 07/20/2018 as listed below except were noted  MDD:  Continue Celexa 20 mg p.o. daily  Insomnia:  Continue trazodone 100 mg p.o. nightly as needed  Alcohol detox:  Continue Ativan protocol/ CIWA  CSW to continue working on discharge disposition Patient encouraged to participate with therapeutic milieu    Derrill [place], NP 07/20/2018, 12:11 PM

## 2018-07-20 NOTE — [facility] Group Notes (Signed)
[facility] Group Notes: (Clinical Social Work)   07/20/2018      Type of Therapy:  Group Therapy   Participation Level:  Did Not Attend despite MHT prompting   Selmer Dominion, LCSW 07/20/2018, 12:40 PM

## 2018-07-20 NOTE — [facility] Group Notes (Signed)
[facility] Group Notes:  Nursing Psychoeducation  Date:  07/20/2018  Time:  1:30 PM  Type of Therapy:  Psychoeducational Skills   Group Topic: Life Skills Facilitated By: Dorena Bodo RN  Participation Level:  Active  Participation Quality:  Appropriate  Affect:  Appropriate  Cognitive:  Alert and Oriented  Insight:  Improving  Engagement in Group:  Developing/Improving  Modes of Intervention:  Discussion, Education and Socialization  Summary of Progress/Problems: Patient attended group and contributed to the discussion.  Samuel Reyes 07/20/2018, 2:00 PM

## 2018-07-20 NOTE — Progress Notes (Signed)
D Patient remains disheveld, he wears hospital-issued patient scrubs, his hair is uncombed and he is [place]. HE is flat, depressed and has a blunted affect. He does make good eye contact. HE interacts with staff appropriately, he takes his scheduled meds as planned. He is very down on himslef, saying " Donnald Garre been doing this too long...ibuprofen just can't get it right".\     A HE answered the questions on his daily assessment as MHT scribed his answers  As follows: he rated his depression, hopelessness and anxeity as " 7/8/7", respectively and he deneid active suicidal ideation, saying " I still want to go to sleep and not wake up". When writer spoke with him about his statement, he responded " oh no...ibuprofen'm not going to actually hurt myslef...ibuprofen always feel like that". He verbally contracted with this writer to not hurt himslef.      R Safety is in place.

## 2018-07-20 NOTE — Plan of Care (Signed)
  Problem: Education: Goal: Understanding of discharge needs will improve Outcome: Progressing   

## 2018-07-20 NOTE — [facility] Group Notes (Signed)
Pt was invited but did not attend orientation/goals group. 

## 2018-07-20 NOTE — Plan of Care (Signed)
  Problem: Education: Goal: Knowledge of disease or condition will improve Outcome: Progressing Goal: Understanding of discharge needs will improve Outcome: Progressing

## 2018-07-20 NOTE — Progress Notes (Signed)
Patient has been up and active on the unit, attended group this evening and has voiced no complaints. Patient currently denies having pain, -si/hi/a/v hall. Support and encouragement offered, safety maintained on unit, will continue to monitor.  

## 2018-07-20 NOTE — Progress Notes (Signed)
Writer has observed patient up at nursing station c/o shortness of breath and itching. Vitals were taken and NP J. Dunnell notified. Orders received for inhalers and visteril given. Writer walked to patients room and noticed that it was extremely hot and asked if temperature could be adjusted for him to be more comfortable. Writer checked back on patient and he reported feeling better. Will continue to monitor patient. Safety maintained with 15 min checks.

## 2018-07-21 NOTE — [facility] Group Notes (Signed)
LCSW Group Therapy Note   07/21/2018 1:15pm   Type of Therapy and Topic:  Group Therapy:  Overcoming Obstacles   Participation Level:  Did Not Attend--pt invited. Chose to remain in bed.    Description of Group:    In this group patients will be encouraged to explore what they see as obstacles to their own wellness and recovery. They will be guided to discuss their thoughts, feelings, and behaviors related to these obstacles. The group will process together ways to cope with barriers, with attention given to specific choices patients can make. Each patient will be challenged to identify changes they are motivated to make in order to overcome their obstacles. This group will be process-oriented, with patients participating in exploration of their own experiences as well as giving and receiving support and challenge from other group members.   Therapeutic Goals: 1. Patient will identify personal and current obstacles as they relate to admission. 2. Patient will identify barriers that currently interfere with their wellness or overcoming obstacles.  3. Patient will identify feelings, thought process and behaviors related to these barriers. 4. Patient will identify two changes they are willing to make to overcome these obstacles:      Summary of Patient Progress   x   Therapeutic Modalities:   Cognitive Behavioral Therapy Solution Focused Therapy Motivational Interviewing Relapse Prevention Therapy  Avelina Laine, LCSW 07/21/2018 12:11 PM

## 2018-07-21 NOTE — Progress Notes (Signed)
[facility] MD Progress Note  07/21/2018 10:22 AM Samuel Reyes  MRN:  [id number]   Evaluation: Malyk observed socializing in group room. He reports chronic head and neck pain (hx degenerative disc with multiple surgeries), denies withdrawal symptoms. He reports some anxiety today about discharge planning. He was referred to out-of-state substance use programs by CSW but states he prefers to remain local to be close to his mother and sister. He intends to stay at a shelter until he can set up a bank account and save enough for housing. Desires outpatient follow up for substance use. States recent medical issues from alcohol "scared me away from drinking." Reports depression overnight thinking about his son and wife's deaths but denies suicidal ideation. Denies HI, AVH. Reports fair sleep and denies any medication side effects. Support and encouragement provided.  Per admission H&P: Mr. Koepp is known to the service he is a 62 year old homeless individual with a history of hypertension, alcohol dependency and cocaine abuse, COPD, coronary disease, and he presented through the emergency department on 12/19 in the morning hours, with mental status changes.  His blood alcohol level was noted to be 352. Patient was staying at the [facility] facility and staff noted he was unconscious while seated in a chair and EMS was called. He did arouse but again it was clear he was intoxicated he was brought in for further evaluation and was medically cleared. Patient states he lives on the streets, there are times he wishes he would go to sleep and not wake up, states he would like some rehab again.  [facility] had the [facility].  States he would need some detox measures as he is drinking daily    Principal Problem: MDD (major depressive disorder), recurrent episode, severe ([place]) Diagnosis: Principal Problem:   MDD (major depressive disorder), recurrent episode, severe ([place])  Total Time spent  with patient: 15 minutes  Past Psychiatric History: See admission H&P  Past Medical History:  Past Medical History:  Diagnosis Date  . Alcoholism ([place])   . CAD (coronary artery disease) 2002   MI, no intervention required  . COPD (chronic obstructive pulmonary disease) (HCC)    not on home O2  . Depression   . GERD (gastroesophageal reflux disease)   . Headache   . Hypertension   . Stroke [facility]) 2006    Past Surgical History:  Procedure Laterality Date  . BACK SURGERY     3 cervical spine surgeries C4-C5 fused  . COLONOSCOPY N/A 01/04/2014   Procedure: COLONOSCOPY;  Surgeon: Danie Binder, MD;  Location: [facility] ENDO SUITE;  Service: Endoscopy;  Laterality: N/A;  1:45  . ESOPHAGOGASTRODUODENOSCOPY N/A 01/04/2014   Procedure: ESOPHAGOGASTRODUODENOSCOPY (EGD);  Surgeon: Danie Binder, MD;  Location: [facility] ENDO SUITE;  Service: Endoscopy;  Laterality: N/A;  . FINGER SURGERY Left    2nd, 3rd, & 4th fingers were cut off by table saw and reattached  . GASTRECTOMY    . HERNIA REPAIR    . INCISIONAL HERNIA REPAIR N/A 01/20/2014   Procedure: LAPAROSCOPIC RECURRENT  INCISIONAL HERNIA with mesh;  Surgeon: Edward Jolly, MD;  Location: [facility] ORS;  Service: General;  Laterality: N/A;  . rt knee arthroscopic surgery    . SHOULDER SURGERY Bilateral    3 surgeries on on left, 2 surgeries on right    Family History:  Family History  Problem Relation Age of Onset  . Cancer Father        bone  .  Cancer Brother        lungs  . Stroke Maternal Grandmother   . Asthma Son        died at age 80 in his sleep   . Spina bifida Son        died at age 74   . Dementia Mother   . Colon cancer Neg Hx    Family Psychiatric  History: See admission H&P Social History:  Social History   Substance and Sexual Activity  Alcohol Use Yes  . Alcohol/week: 3.0 - 4.0 standard drinks  . Types: 3 - 4 Cans of beer per week   Comment: everyday- 3-4 40 oz     Social History   Substance and Sexual Activity  Drug  Use No   Comment: denied using any drugs    Social History   Socioeconomic History  . Marital status: Widowed    Spouse name: Not on file  . Number of children: 3  . Years of education: Not on file  . Highest education level: Not on file  Occupational History  . Occupation: Disability  Social Needs  . Financial resource strain: Not on file  . Food insecurity:    Worry: Not on file    Inability: Not on file  . Transportation needs:    Medical: Yes    Non-medical: Yes  Tobacco Use  . Smoking status: Current Every Day Smoker    Packs/day: 2.00    Years: 52.00    Pack years: 104.00    Types: Cigarettes    Start date: 07/30/1966  . Smokeless tobacco: Never Used  . Tobacco comment: down to 1/4 ppd  Substance and Sexual Activity  . Alcohol use: Yes    Alcohol/week: 3.0 - 4.0 standard drinks    Types: 3 - 4 Cans of beer per week    Comment: everyday- 3-4 40 oz  . Drug use: No    Comment: denied using any drugs  . Sexual activity: Never  Lifestyle  . Physical activity:    Days per week: Not on file    Minutes per session: Not on file  . Stress: Not on file  Relationships  . Social connections:    Talks on phone: Not on file    Gets together: Not on file    Attends religious service: Not on file    Active member of club or organization: Not on file    Attends meetings of clubs or organizations: Not on file    Relationship status: Not on file  Other Topics Concern  . Not on file  Social History Narrative  . Not on file   Additional Social History:       Sleep: Fair  Appetite:  Fair  Current Medications: Current Facility-Administered Medications  Medication Dose Route Frequency Provider Last Rate Last Dose  . acetaminophen (TYLENOL) tablet 650 mg  650 mg Oral Q6H PRN Laverle Hobby, PA-C   650 mg at 07/21/18 0626  . albuterol (PROVENTIL HFA;VENTOLIN HFA) 108 (90 Base) MCG/ACT inhaler 2 puff  2 puff Inhalation Q4H PRN Lindon Romp A, NP   2 puff at 07/19/18 2048   . alum & mag hydroxide-simeth (MAALOX/MYLANTA) 200-200-20 MG/5ML suspension 30 mL  30 mL Oral Q4H PRN Laverle Hobby, PA-C      . citalopram (CELEXA) tablet 20 mg  20 mg Oral Daily Johnn Hai, MD   20 mg at 07/21/18 0820  . hydrocortisone cream 1 %   Topical TID PRN  Rozetta Nunnery, NP   1 application at 82/42/35 2131  . magnesium hydroxide (MILK OF MAGNESIA) suspension 30 mL  30 mL Oral Daily PRN Laverle Hobby, PA-C      . multivitamin with minerals tablet 1 tablet  1 tablet Oral Daily Laverle Hobby, PA-C   1 tablet at 07/21/18 0820  . propranolol (INDERAL) tablet 10 mg  10 mg Oral TID Johnn Hai, MD   10 mg at 07/21/18 0820  . thiamine (B-1) injection 100 mg  100 mg Intramuscular Once Patriciaann Clan E, PA-C      . thiamine (VITAMIN B-1) tablet 100 mg  100 mg Oral Daily Patriciaann Clan E, PA-C   100 mg at 07/21/18 0820  . tiotropium (SPIRIVA) inhalation capsule ([facility] use ONLY) 18 mcg  18 mcg Inhalation Daily Lindon Romp A, NP   18 mcg at 07/21/18 0820  . traZODone (DESYREL) tablet 100 mg  100 mg Oral QHS,MR X 1 Laverle Hobby, PA-C   100 mg at 07/20/18 2131    Lab Results: No results found for this or any previous visit (from the past 48 hour(s)).  Blood Alcohol level:  Lab Results  Component Value Date   ETH 352 (HH) 07/17/2018   ETH 337 (HH) 36/14/4315    Metabolic Disorder Labs: Lab Results  Component Value Date   HGBA1C 5.4 06/26/2017   MPG 108 06/26/2017   MPG 105 08/03/2016   No results found for: PROLACTIN Lab Results  Component Value Date   CHOL 247 (H) 02/17/2013   TRIG 144 02/17/2013   HDL 79 02/17/2013   CHOLHDL 3.1 02/17/2013   VLDL 29 02/17/2013   LDLCALC 139 (H) 02/17/2013    Physical Findings: AIMS: Facial and Oral Movements Muscles of Facial Expression: None, normal Lips and Perioral Area: None, normal Tongue: None, normal,Extremity Movements Upper (arms, wrists, hands, fingers): None, normal Lower (legs, knees, ankles, toes): None, normal,  Trunk Movements Neck, shoulders, hips: None, normal, Overall Severity Severity of abnormal movements (highest score from questions above): None, normal Incapacitation due to abnormal movements: None, normal Patient's awareness of abnormal movements (rate only patient's report): No Awareness, Dental Status Current problems with teeth and/or dentures?: No Does patient usually wear dentures?: No  CIWA:  CIWA-Ar Total: 0 COWS:     Musculoskeletal: Strength & Muscle Tone: within normal limits Gait & Station: normal Patient leans: N/A  Psychiatric Specialty Exam: Physical Exam  Nursing note and vitals reviewed. Constitutional: He is oriented to person, place, and time.  Respiratory: Effort normal.  Musculoskeletal: Normal range of motion.  Neurological: He is alert and oriented to person, place, and time.    Review of Systems  Musculoskeletal: Positive for joint pain (chronic right knee pain) and neck pain (chronic degenerative disc).  Neurological: Positive for headaches.  Psychiatric/Behavioral: Positive for depression and substance abuse (Hx ETOH, cocaine). Negative for hallucinations, memory loss and suicidal ideas. The patient is nervous/anxious. The patient does not have insomnia.     Blood pressure 108/88, pulse (!) 103, temperature 98.3 F (36.8 C), temperature source Oral, resp. rate 20, height 5\' 5"  (1.651 m), weight 66 kg, SpO2 97 %.Body mass index is 24.21 kg/m.  General Appearance: Disheveled  Eye Contact:  Good  Speech:  Clear and Coherent  Volume:  Normal  Mood:  Depressed  Affect:  Blunt  Thought Process:  Coherent  Orientation:  Full (Time, Place, and Person)  Thought Content:  WDL  Suicidal Thoughts:  No  Homicidal Thoughts:  No  Memory:  Immediate;   Good  Judgement:  Fair  Insight:  Fair  Psychomotor Activity:  Normal  Concentration:  Concentration: Good  Recall:  Good  Fund of Knowledge:  Fair  Language:  Good  Akathisia:  No  Handed:  Right  AIMS  (if indicated):     Assets:  Communication Skills Desire for Improvement Social Support  ADL's:  Intact  Cognition:  WNL  Sleep:  Number of Hours: 6     Treatment Plan Summary: Daily contact with patient to assess and evaluate symptoms and progress in treatment and Medication management   Continue inpatient hospitalization. Continue current treatment plan as listed below:  MDD: Continue Celexa 20 mg PO daily  Insomnia: Continue trazodone 100 mg PO QHS, may repeat x1 PRN  ETOH detox: Continue CIWA protocol  Other medical: Continue albuterol inhaler 2 puffs Q6HR PRN SOB Continue Spiriva inhaler 18 mcg daily  Patient will participate in the therapeutic group milieu.  Discharge disposition in progress.    Connye Burkitt, NP 07/21/2018, 10:22 AM

## 2018-07-21 NOTE — Tx Team (Signed)
Interdisciplinary Treatment and Diagnostic Plan Update  07/21/2018 Time of Session: 0830AM Samuel Reyes MRN: [id number]  Principal Diagnosis: MDD, recurrent, severe  Secondary Diagnoses: Principal Problem:   MDD (major depressive disorder), recurrent episode, severe ([place])   Current Medications:  Current Facility-Administered Medications  Medication Dose Route Frequency Provider Last Rate Last Dose  . acetaminophen (TYLENOL) tablet 650 mg  650 mg Oral Q6H PRN Laverle Hobby, PA-C   650 mg at 07/21/18 2536  . albuterol (PROVENTIL HFA;VENTOLIN HFA) 108 (90 Base) MCG/ACT inhaler 2 puff  2 puff Inhalation Q4H PRN Lindon Romp A, NP   2 puff at 07/19/18 2048  . alum & mag hydroxide-simeth (MAALOX/MYLANTA) 200-200-20 MG/5ML suspension 30 mL  30 mL Oral Q4H PRN Laverle Hobby, PA-C      . citalopram (CELEXA) tablet 20 mg  20 mg Oral Daily Johnn Hai, MD   20 mg at 07/21/18 0820  . hydrocortisone cream 1 %   Topical TID PRN Rozetta Nunnery, NP   1 application at 64/40/34 2131  . magnesium hydroxide (MILK OF MAGNESIA) suspension 30 mL  30 mL Oral Daily PRN Laverle Hobby, PA-C      . multivitamin with minerals tablet 1 tablet  1 tablet Oral Daily Laverle Hobby, PA-C   1 tablet at 07/21/18 0820  . propranolol (INDERAL) tablet 10 mg  10 mg Oral TID Johnn Hai, MD   10 mg at 07/21/18 0820  . thiamine (B-1) injection 100 mg  100 mg Intramuscular Once Patriciaann Clan E, PA-C      . thiamine (VITAMIN B-1) tablet 100 mg  100 mg Oral Daily Patriciaann Clan E, PA-C   100 mg at 07/21/18 0820  . tiotropium (SPIRIVA) inhalation capsule ([facility] use ONLY) 18 mcg  18 mcg Inhalation Daily Lindon Romp A, NP   18 mcg at 07/21/18 0820  . traZODone (DESYREL) tablet 100 mg  100 mg Oral QHS,MR X 1 Laverle Hobby, PA-C   100 mg at 07/20/18 2131   PTA Medications: Medications Prior to Admission  Medication Sig Dispense Refill Last Dose  . buPROPion (WELLBUTRIN XL) 150 MG 24 hr tablet Take 1 tablet (150 mg  total) by mouth daily. (Patient not taking: Reported on 07/18/2018) 7 tablet 0 Not Taking at Unknown time  . gabapentin (NEURONTIN) 300 MG capsule Take 1 capsule (300 mg total) by mouth 3 (three) times daily. (Patient not taking: Reported on 07/18/2018) 14 capsule 0 Not Taking at Unknown time  . traZODone (DESYREL) 50 MG tablet Take 1 tablet (50 mg total) by mouth at bedtime as needed for sleep. (Patient not taking: Reported on 07/18/2018) 7 tablet 0 Not Taking at Unknown time    Patient Stressors: Medication change or noncompliance Occupational concerns Substance abuse  Patient Strengths: Ability for insight Active sense of humor  Treatment Modalities: Medication Management, Group therapy, Case management,  1 to 1 session with clinician, Psychoeducation, Recreational therapy.   Physician Treatment Plan for Primary Diagnosis: MDD, recurrent, severe Long Term Goal(s): Improvement in symptoms so as ready for discharge Improvement in symptoms so as ready for discharge   Short Term Goals: Ability to identify changes in lifestyle to reduce recurrence of condition will improve Ability to verbalize feelings will improve Ability to demonstrate self-control will improve Ability to identify and develop effective coping behaviors will improve  Medication Management: Evaluate patient's response, side effects, and tolerance of medication regimen.  Therapeutic Interventions: 1 to 1 sessions, Unit Group sessions and Medication administration.  Evaluation of Outcomes: Progressing  Physician Treatment Plan for Secondary Diagnosis: Principal Problem:   MDD (major depressive disorder), recurrent episode, severe ([place])  Long Term Goal(s): Improvement in symptoms so as ready for discharge Improvement in symptoms so as ready for discharge   Short Term Goals: Ability to identify changes in lifestyle to reduce recurrence of condition will improve Ability to verbalize feelings will improve Ability to  demonstrate self-control will improve Ability to identify and develop effective coping behaviors will improve     Medication Management: Evaluate patient's response, side effects, and tolerance of medication regimen.  Therapeutic Interventions: 1 to 1 sessions, Unit Group sessions and Medication administration.  Evaluation of Outcomes: Progressing   RN Treatment Plan for Primary Diagnosis: MDD, recurrent, severe Long Term Goal(s): Knowledge of disease and therapeutic regimen to maintain health will improve  Short Term Goals: Ability to remain free from injury will improve, Ability to verbalize frustration and anger appropriately will improve, Ability to disclose and discuss suicidal ideas and Ability to identify and develop effective coping behaviors will improve  Medication Management: RN will administer medications as ordered by provider, will assess and evaluate patient's response and provide education to patient for prescribed medication. RN will report any adverse and/or side effects to prescribing provider.  Therapeutic Interventions: 1 on 1 counseling sessions, Psychoeducation, Medication administration, Evaluate responses to treatment, Monitor vital signs and CBGs as ordered, Perform/monitor CIWA, COWS, AIMS and Fall Risk screenings as ordered, Perform wound care treatments as ordered.  Evaluation of Outcomes: Progressing   LCSW Treatment Plan for Primary Diagnosis: MDD, recurrent, severe Long Term Goal(s): Safe transition to appropriate next level of care at discharge, Engage patient in therapeutic group addressing interpersonal concerns.  Short Term Goals: Engage patient in aftercare planning with referrals and resources, Facilitate patient progression through stages of change regarding substance use diagnoses and concerns and Identify triggers associated with mental health/substance abuse issues  Therapeutic Interventions: Assess for all discharge needs, 1 to 1 time with Social  worker, Explore available resources and support systems, Assess for adequacy in community support network, Educate family and significant other(s) on suicide prevention, Complete Psychosocial Assessment, Interpersonal group therapy.  Evaluation of Outcomes: Progressing   Progress in Treatment: Attending groups: No. New to unit. Continuing to assess.  Participating in groups: No. Taking medication as prescribed: Yes. Toleration medication: Yes. Family/Significant other contact made: SPE completed with pt; pt declined to consent to collateral contact.  Patient understands diagnosis: Yes. Discussing patient identified problems/goals with staff: Yes. Medical problems stabilized or resolved: Yes. Denies suicidal/homicidal ideation: Yes. Issues/concerns per patient self-inventory: Yes. Other: n/a   New problem(s) identified: No, Describe:  n/a  New Short Term/Long Term Goal(s): detox, medication management for mood stabilization; elimination of SI thoughts; development of comprehensive mental wellness/sobriety plan.   Patient Goals:  "detox and rehabilitation."   Discharge Plan or Barriers: CSW assessing for appropriate referrals--he has been referred to [facility] and [facility].  Pt reports that he is homeless chronically. Last admission in 2017, pt was referred to [facility]. [place] pamphlet, Mobile Crisis information, and AA/NA information provided to patient for additional community support and resources.    Reason for Continuation of Hospitalization: Anxiety Depression Medication stabilization Withdrawal symptoms  Estimated Length of Stay: Friday, 07/25/18  Attendees: Patient:  07/21/2018 8:47 AM  Physician: Dr. Mallie Darting MD 07/21/2018 8:47 AM  Nursing: Benjamine Mola RN 07/21/2018 8:47 AM  RN Care Manager:x 07/21/2018 8:47 AM  Social Worker: Janice Norrie LCSW 07/21/2018  8:47 AM  Recreational Therapist: x 07/21/2018 8:47 AM  Other: Lindell Spar  NP; Harriett Sine NP 07/21/2018 8:47 AM  Other:  07/21/2018 8:47 AM  Other: 07/21/2018 8:47 AM    Scribe for Treatment Team: Avelina Laine, LCSW 07/21/2018 8:47 AM

## 2018-07-21 NOTE — Progress Notes (Addendum)
Recreation Therapy Notes  Date: 12.23.19 Time: 0930 Location: [address] Dayroom  Group Topic: Stress Management  Goal Area(s) Addresses:  Patient will verbalize importance of using healthy stress management.  Patient will identify positive emotions associated with healthy stress management.   Behavioral Response: Engaged  Intervention: Stress Management  Activity :  Meditation.  LRT introduced the stress management technique of meditation.  LRT played a meditation that allowed patients to focus on the possibilities the day holds.  Patients were to follow along as the meditation played to engage in the activity.  Education:  Stress Management, Discharge Planning.   Education Outcome: Acknowledges edcuation/In group clarification offered/Needs additional education  Clinical Observations/Feedback: Pt attended and was engaged in group.     [name], LRT/CTRS         [name] A 07/21/2018 10:52 AM

## 2018-07-21 NOTE — Progress Notes (Signed)
CSW met with pt. He is requesting help with opening up a new bank account. CSW provided pt with various bank information/1-800 help numbers and informed him that he likely could not open an acct while in the hospital and would have to physically go to a bank to open an account. Pt aware that he has been referred to [facility] and [facility] (both out of state substance abuse programs). Pt is now stating that he would prefer to stay in [place] but remains homeless. Pt given IRC and various shelter information. He does not meet criteria for [facility] and [facility] due to medical issues. CSW continuing to assess.   S. , MSW, LCSW Clinical Social Worker 07/21/2018 9:42 AM   

## 2018-07-21 NOTE — Plan of Care (Signed)
  Problem: Safety: Goal: Ability to remain free from injury will improve Outcome: Progressing   Problem: Medication: Goal: Compliance with prescribed medication regimen will improve Outcome: Progressing  DAR NOTE: Patient presents with anxious affect and depressed mood.  Denies suicidal thoughts, auditory and visual hallucinations.  Rates depression at 7, hopelessness at 5, and anxiety at 5.  Maintained on routine safety checks.  Medications given as prescribed.  Support and encouragement offered as needed.  Attended group and participated.  States goal for today is "getting help to find a place to live."  Patient observed socializing with peers in the dayroom.  Offered no complaint.

## 2018-07-22 MED ORDER — CITALOPRAM HYDROBROMIDE 20 MG PO TABS: 20.0000 mg | ORAL_TABLET | Freq: Every day | ORAL | 0 refills | Status: DC

## 2018-07-22 MED ORDER — TRAZODONE HCL 100 MG PO TABS: 100.0000 mg | ORAL_TABLET | Freq: Every evening | ORAL | 0 refills | Status: DC | PRN

## 2018-07-22 MED ORDER — ADULT MULTIVITAMIN W/MINERALS CH: 1.0000 | ORAL_TABLET | Freq: Every day | ORAL | Status: DC

## 2018-07-22 MED ORDER — PROPRANOLOL HCL 10 MG PO TABS: 10.0000 mg | ORAL_TABLET | Freq: Three times a day (TID) | ORAL | 0 refills | Status: DC

## 2018-07-22 MED ORDER — TIOTROPIUM BROMIDE MONOHYDRATE 18 MCG IN CAPS: 18.0000 ug | ORAL_CAPSULE | Freq: Every day | RESPIRATORY_TRACT | 12 refills | Status: DC

## 2018-07-22 MED ORDER — TRAZODONE HCL 50 MG PO TABS
50.0000 mg | ORAL_TABLET | Freq: Every evening | ORAL | Status: DC | PRN
  Filled 2018-07-22: qty 7

## 2018-07-22 MED ORDER — HYDROCORTISONE 1 % EX CREA: TOPICAL_CREAM | Freq: Three times a day (TID) | CUTANEOUS | 0 refills | Status: DC | PRN

## 2018-07-22 MED ORDER — ALBUTEROL SULFATE HFA 108 (90 BASE) MCG/ACT IN AERS: 2.0000 | INHALATION_SPRAY | RESPIRATORY_TRACT | Status: DC | PRN

## 2018-07-22 NOTE — [facility] Suicide Risk Assessment (Signed)
[employer] Discharge Suicide Risk Assessment   Principal Problem: MDD (major depressive disorder), recurrent episode, severe ([place]) Discharge Diagnoses: Principal Problem:   MDD (major depressive disorder), recurrent episode, severe ([place]) Active Problems:   Alcohol use disorder, severe, dependence ([place])   Total Time spent with patient: 30 minutes Mental Status Per Nursing Assessment::   On Admission:  NA Current mental status exam alert oriented to person place time situation denies suicidal thoughts plans or intent denies homicidal thoughts plans or intent no psychosis no cravings tremors or withdrawal mood stable Demographic Factors:  Male  Loss Factors: Decrease in vocational status  Historical Factors: NA  Risk Reduction Factors:   Positive coping skills or problem solving skills  Continued Clinical Symptoms:  Alcohol/Substance Abuse/Dependencies  Cognitive Features That Contribute To Risk:  None    Suicide Risk:  Mild:  Suicidal ideation of limited frequency, intensity, duration, and specificity.  There are no identifiable plans, no associated intent, mild dysphoria and related symptoms, good self-control (both objective and subjective assessment), few other risk factors, and identifiable protective factors, including available and accessible social support.  Follow-up Information    [facility] Follow up.   Why:  Referral made: 07/18/18 Contact information: [address] [place] [address] [phone]        [facility] Follow up.   Why:  Referral made: 07/18/18 Contact information: [place] [address] Phone: [phone] Fax: [phone]       [facility] on 07/28/2018.   Why:  Please attend your assessment for medical, behavioral, and social care appointment on Monday, 07/28/18 at 10:00a.  Bring your photo ID, proof of insurance, and social security card.  Contact information: [address] [place] [address] Phone: [phone] Fax: [phone]          Plan Of Care/Follow-up recommendations:  Activity:  full  Rickelle Sylvestre, MD 07/22/2018, 8:55 AM

## 2018-07-22 NOTE — Progress Notes (Signed)
Recreation Therapy Notes  Animal-Assisted Activity (AAA) Program Checklist/Progress Notes Patient Eligibility Criteria Checklist & Daily Group note for Rec Tx Intervention  Date: 12.24.19 Time: 29 Location: [address]   AAA/T Program Assumption of Risk Form signed by Teacher, music or Parent Legal Guardian  YES   Patient is free of allergies or sever asthma  YES   Patient reports no fear of animals  YES   Patient reports no history of cruelty to animals  YES   Patient understands his/her participation is voluntary  YES   Patient washes hands before animal contact  YES   Patient washes hands after animal contact  YES   Education: Contractor, Appropriate Animal Interaction   Education Outcome: Acknowledges understanding/In group clarification offered/Needs additional education.   Clinical Observations/Feedback: Pt did not attend group.    [name], LRT/CTRS         [name] A 07/22/2018 11:58 AM

## 2018-07-22 NOTE — Progress Notes (Signed)
Pt reports his day has been fair.  He contracts for Probation officer.  He denies HI/AVH.  He says his withdrawal symptoms are decreasing.  He is not sure where he will be going once he is discharged, but says he is working with the [facility] on a place to go.  He has been in the dayroom watching TV with occasional conversation with other patients.  He makes his needs known to staff.  He is pleasant and cooperative.  Support and encouragement offered.  Discharge plans are in process.  Safety maintained with q15 minute checks.

## 2018-07-22 NOTE — Progress Notes (Signed)
Patient discharged to lobby. Patient was stable and appreciative at that time. All papers, samples and prescriptions were given and valuables returned. Verbal understanding expressed. Denies SI/HI and A/VH. Patient given opportunity to express concerns and ask questions.  

## 2018-07-22 NOTE — Progress Notes (Signed)
CSW discussed discharge plan with pt.  Per handoff, pt does not have medicare anymore--cannot go to out of state residential at [facility] or [facility].  Pt asking for shelter options--after discussing this, pt wants to go to [facility] due to available beds there.  Discussed [facility] for follow up.  Cancelled [facility] appt. Winferd Humphrey, MSW, LCSW Clinical Social Worker 07/22/2018 9:55 AM

## 2018-07-22 NOTE — Discharge Summary (Signed)
Physician Discharge Summary Note  Patient:  Samuel Reyes is an 62 y.o., male  MRN:  [id number]  DOB:  November 02, 1955  Patient phone:  [phone] (home)   Patient address:   [place] [address],   Total Time spent with patient: Greater than 30 minutes  Date of Admission:  07/18/2018  Date of Discharge: 07/22/18  Reason for Admission:  Modified from H&P: "Mr. Osorto is known to the service he is a 62 year old homeless individual with a history of hypertension, alcohol dependency and cocaine abuse, COPD, coronary disease, and he presented through the emergency department on 12/19 in the morning hours, with mental status changes.  His blood alcohol level was noted to be 352.  Patient was staying at the [facility] facility and staff noted he was unconscious while seated in a chair and EMS was called. He did arouse but again it was clear he was intoxicated he was brought in for further evaluation and was medically cleared.  Patient states he lives on the streets, there are times he wishes he would go to sleep and not wake up, states he would like some rehab again.  [facility]  States he would need some detox measures as he is drinking daily a variable amount but obviously drinking to the point of intoxication.  At this point acknowledges depression in his situation denies wanting to harm others reports that he has no active suicidal plans other than wishing he would simply not wake up."   Principal Problem: MDD (major depressive disorder), recurrent episode, severe [facility])  Discharge Diagnoses: Patient Active Problem List   Diagnosis Date Noted  . MDD (major depressive disorder), recurrent episode, severe ([place]) [F33.2] 07/18/2018  . MDD (major depressive disorder), recurrent severe, without psychosis ([place]) [F33.2] 08/26/2017  . Alcohol withdrawal ([place]) [F10.239] 08/08/2017  . COPD with acute bronchitis ([place]) [J44.0, J20.9]  07/29/2017  . Alcohol use [Z72.89]   . Homelessness [Z59.0] 06/24/2017  . Lung nodule [R91.1] 09/03/2016  . Tobacco abuse [Z72.0] 09/03/2016  . Insomnia [G47.00] 09/03/2016  . HCAP (healthcare-associated pneumonia) [J18.9] 08/22/2016  . Bronchiolitis [J21.9] 08/21/2016  . Malnutrition of moderate degree [E44.0] 08/04/2016  . Atrial tachycardia ([place]) [I47.1] 08/02/2016  . Impaired glucose tolerance [R73.02] 08/02/2016  . Alcohol abuse with alcohol-induced mood disorder ([place]) [F10.14] 12/12/2015  . Syncope [R55] 11/15/2015  . Chest pain [R07.9] 11/15/2015  . Nausea vomiting and diarrhea [R11.2, R19.7] 11/15/2015  . Abdominal pain [R10.9] 11/15/2015  . Major depressive disorder, recurrent episode, moderate with anxious distress ([place]) [F33.1] 10/30/2015  . Alcohol use disorder, severe, dependence ([place]) [F10.20] 10/29/2015  . COPD exacerbation ([place]) [J44.1] 09/20/2015  . Acute respiratory failure with hypoxia ([place]) [J96.01] 09/18/2015  . Suicidal ideation [R45.851] 09/17/2015  . Alcohol intoxication ([place]) [F10.929] 09/17/2015  . Depression [F32.9] 09/17/2015  . Benign essential HTN [I10] 09/17/2015  . Hypokalemia [E87.6] 09/17/2015  . Hyponatremia [E87.1] 09/17/2015  . Coffee ground emesis [K92.0] 09/17/2015  . COPD (chronic obstructive pulmonary disease) ([place]) [J44.9] 02/17/2013  . GERD (gastroesophageal reflux disease) [K21.9] 02/17/2013   Past Psychiatric History: Alcoholism, chronic. Patient has been in rehab on multiple occassions in the past. He has had accidental overdoses in the past. He denies any active suicidal attempt. He denies any past history of violent behavior. He is currently on Bupropion.    Past Medical History:  Past Medical History:  Diagnosis Date  . Alcoholism ([place])   . CAD (coronary artery disease) 2002  MI, no intervention required  . COPD (chronic obstructive pulmonary disease) (HCC)    not on home O2  . Depression   . GERD (gastroesophageal reflux  disease)   . Headache   . Hypertension   . Stroke [facility]) 2006    Past Surgical History:  Procedure Laterality Date  . BACK SURGERY     3 cervical spine surgeries C4-C5 fused  . COLONOSCOPY N/A 01/04/2014   Procedure: COLONOSCOPY;  Surgeon: Danie Binder, MD;  Location[facility];  Service: Endoscopy;  Laterality: N/A;  1:45  . ESOPHAGOGASTRODUODENOSCOPY N/A 01/04/2014   Procedure: ESOPHAGOGASTRODUODENOSCOPY (EGD);  Surgeon: Danie Binder, MD;  Location: [facility];  Service: Endoscopy;  Laterality: N/A;  . FINGER SURGERY Left    2nd, 3rd, & 4th fingers were cut off by table saw and reattached  . GASTRECTOMY    . HERNIA REPAIR    . INCISIONAL HERNIA REPAIR N/A 01/20/2014   Procedure: LAPAROSCOPIC RECURRENT  INCISIONAL HERNIA with mesh;  Surgeon: Edward Jolly, MD;  Location: [facility] ORS;  Service: General;  Laterality: N/A;  . rt knee arthroscopic surgery    . SHOULDER SURGERY Bilateral    3 surgeries on on left, 2 surgeries on right    Family History:  Family History  Problem Relation Age of Onset  . Cancer Father        bone  . Cancer Brother        lungs  . Stroke Maternal Grandmother   . Asthma Son        died at age 71 in his sleep   . Spina bifida Son        died at age 34   . Dementia Mother   . Colon cancer Neg Hx    Family Psychiatric  History: See H&P  Social History:  Social History   Substance and Sexual Activity  Alcohol Use Yes  . Alcohol/week: 3.0 - 4.0 standard drinks  . Types: 3 - 4 Cans of beer per week   Comment: everyday- 3-4 40 oz     Social History   Substance and Sexual Activity  Drug Use No   Comment: denied using any drugs    Social History   Socioeconomic History  . Marital status: Widowed    Spouse name: Not on file  . Number of children: 3  . Years of education: Not on file  . Highest education level: Not on file  Occupational History  . Occupation: Disability  Social Needs  . Financial resource strain: Not on file  .  Food insecurity:    Worry: Not on file    Inability: Not on file  . Transportation needs:    Medical: Yes    Non-medical: Yes  Tobacco Use  . Smoking status: Current Every Day Smoker    Packs/day: 2.00    Years: 52.00    Pack years: 104.00    Types: Cigarettes    Start date: 07/30/1966  . Smokeless tobacco: Never Used  . Tobacco comment: down to 1/4 ppd  Substance and Sexual Activity  . Alcohol use: Yes    Alcohol/week: 3.0 - 4.0 standard drinks    Types: 3 - 4 Cans of beer per week    Comment: everyday- 3-4 40 oz  . Drug use: No    Comment: denied using any drugs  . Sexual activity: Never  Lifestyle  . Physical activity:    Days per week: Not on file  Minutes per session: Not on file  . Stress: Not on file  Relationships  . Social connections:    Talks on phone: Not on file    Gets together: Not on file    Attends religious service: Not on file    Active member of club or organization: Not on file    Attends meetings of clubs or organizations: Not on file    Relationship status: Not on file  Other Topics Concern  . Not on file  Social History Narrative  . Not on file   Hospital Course: Zyier Dykema was admitted for MDD (major depressive disorder), recurrent episode, severe ([place]) , with psychosis and crisis management.  Pt was treated discharged with the medications listed below under Medication List.  Medical problems were identified and treated as needed.  Home medications were restarted as appropriate.  Improvement was monitored by observation and Scherrie Merritts 's daily report of symptom reduction.  Emotional and mental status was monitored by daily self-inventory reports completed by Scherrie Merritts and clinical staff.         Yvette Roark was evaluated by the treatment team for stability and plans for continued recovery upon discharge. Glover Capano 's motivation was an integral factor for scheduling further treatment. Employment, transportation, bed  availability, health status, family support, and any pending legal issues were also considered during hospital stay. Pt was offered further treatment options upon discharge including but not limited to Residential, Intensive Outpatient, and Outpatient treatment.  [facility] will follow up with the services as listed below under Follow Up Information.      Upon completion of this admission the patient was both mentally and medically stable for discharge denying suicidal/homicidal ideation, auditory/visual/tactile hallucinations, delusional thoughts and paranoia.     Scherrie Merritts responded well to treatment with Celexa, Inderal, and Trazodone without adverse effects. Pt demonstrated improvement without reported or observed adverse effects to the point of stability appropriate for outpatient management. Pertinent labs include: UDS negative. Reviewed CBC, CMP, BAL, and UDS; all unremarkable aside from noted exceptions.    Physical Findings: AIMS: Facial and Oral Movements Muscles of Facial Expression: None, normal Lips and Perioral Area: None, normal Jaw: None, normal Tongue: None, normal,Extremity Movements Upper (arms, wrists, hands, fingers): None, normal Lower (legs, knees, ankles, toes): None, normal, Trunk Movements Neck, shoulders, hips: None, normal, Overall Severity Severity of abnormal movements (highest score from questions above): None, normal Incapacitation due to abnormal movements: None, normal Patient's awareness of abnormal movements (rate only patient's report): No Awareness, Dental Status Current problems with teeth and/or dentures?: No Does patient usually wear dentures?: No  CIWA:  CIWA-Ar Total: 2 COWS:     Musculoskeletal: Strength & Muscle Tone: within normal limits Gait & Station: normal Patient leans: N/A  Psychiatric Specialty Exam: Physical Exam  Nursing note and vitals reviewed. Constitutional: He is oriented to person, place, and time. He appears  well-developed and well-nourished.  HENT:  Head: Normocephalic.  Eyes: Pupils are equal, round, and reactive to light.  Neck: Normal range of motion.  Cardiovascular: Normal rate.  Respiratory: Effort normal.  GI: Soft.  Genitourinary:    Genitourinary Comments: Deferred   Musculoskeletal: Normal range of motion.  Neurological: He is alert and oriented to person, place, and time.  Skin: Skin is warm.    Review of Systems  Constitutional: Negative.   HENT: Negative.   Eyes: Negative.   Respiratory: Negative.  Negative for cough and shortness of breath.   Cardiovascular: Negative.  Negative for chest pain and palpitations.  Gastrointestinal: Negative.  Negative for abdominal pain, heartburn, nausea and vomiting.  Genitourinary: Negative.   Musculoskeletal: Negative.   Skin: Negative.   Neurological: Negative.  Negative for dizziness and headaches.  Endo/Heme/Allergies: Negative.   Psychiatric/Behavioral: Positive for depression (Stable) and substance abuse (Hx. alcoholism, chronic). Negative for hallucinations, memory loss and suicidal ideas. The patient has insomnia (Stable). The patient is not nervous/anxious (Stable).     Blood pressure 123/78, pulse (!) 102, temperature 98.3 F (36.8 C), temperature source Oral, resp. rate 16, height 5\' 5"  (1.651 m), weight 66 kg, SpO2 97 %.Body mass index is 24.21 kg/m.  See MD discharge SRA   Has this patient used any form of tobacco in the last 30 days? (Cigarettes, Smokeless Tobacco, Cigars, and/or Pipes): N/A  Blood Alcohol level:  Lab Results  Component Value Date   ETH 352 ([place]) 07/17/2018   ETH 337 ([facility]) 32/67/1245   Metabolic Disorder Labs:  Lab Results  Component Value Date   HGBA1C 5.4 06/26/2017   MPG 108 06/26/2017   MPG 105 08/03/2016   No results found for: PROLACTIN Lab Results  Component Value Date   CHOL 247 (H) 02/17/2013   TRIG 144 02/17/2013   HDL 79 02/17/2013   CHOLHDL 3.1 02/17/2013   VLDL 29 02/17/2013    LDLCALC 139 (H) 02/17/2013    See Psychiatric Specialty Exam and Suicide Risk Assessment completed by Attending Physician prior to discharge.  Discharge destination:  [facility]  Is patient on multiple antipsychotic therapies at discharge:  No   Has Patient had three or more failed trials of antipsychotic monotherapy by history:  No  Recommended Plan for Multiple Antipsychotic Therapies: NA  Allergies as of 07/22/2018      Reactions   Bee Venom Anaphylaxis   Penicillins Rash   Has patient had a PCN reaction causing immediate rash, facial/tongue/throat swelling, SOB or lightheadedness with hypotension: {Yes Has patient had a PCN reaction causing severe rash involving mucus membranes or skin necrosis: YES Has patient had a PCN reaction that required hospitalization Yes Has patient had a PCN reaction occurring within the last 10 years: YES If all of the above answers are "NO", then may proceed with Cephalosporin use.      Medication List    STOP taking these medications   buPROPion 150 MG 24 hr tablet Commonly known as:  WELLBUTRIN XL   gabapentin 300 MG capsule Commonly known as:  NEURONTIN     TAKE these medications     Indication  albuterol 108 (90 Base) MCG/ACT inhaler Commonly known as:  PROVENTIL HFA;VENTOLIN HFA Inhale 2 puffs into the lungs every 4 (four) hours as needed for wheezing or shortness of breath.  Indication:  Chronic Obstructive Lung Disease   citalopram 20 MG tablet Commonly known as:  CELEXA Take 1 tablet (20 mg total) by mouth daily. For depression Start taking on:  July 23, 2018  Indication:  Depression   hydrocortisone cream 1 % Apply topically 3 (three) times daily as needed for itching.  Indication:  Itching   multivitamin with minerals Tabs tablet Take 1 tablet by mouth daily. Vitamin supplementation Start taking on:  July 23, 2018  Indication:  Vitamin drficiency   propranolol 10 MG tablet Commonly known as:   INDERAL Take 1 tablet (10 mg total) by mouth 3 (three) times daily. For anxiety  Indication:  Anxiety   tiotropium 18 MCG inhalation capsule Commonly known as:  [place] 1  capsule (18 mcg total) into inhaler and inhale daily. For COPD Start taking on:  July 23, 2018  Indication:  Chronic Obstructive Lung Disease   traZODone 100 MG tablet Commonly known as:  DESYREL Take 1 tablet (100 mg total) by mouth at bedtime as needed for sleep. What changed:    medication strength  how much to take  Indication:  [facility] [place] Follow up.   Why:  Referral made: 07/18/18 Contact information: [address] [place] [address] [phone]        [facility] Follow up.   Why:  Referral made: 07/18/18 Contact information: [place] [address] Phone: [phone] Fax: [phone]         Follow-up recommendations: Activity:  As tolerated Diet: As recommended by your primary care doctor. Keep all scheduled follow-up appointments as recommended.   Comments:  Patient is instructed prior to discharge to: Take all medications as prescribed by his/her mental healthcare provider. Report any adverse effects and or reactions from the medicines to his/her outpatient provider promptly. Patient has been instructed & cautioned: To not engage in alcohol and or illegal drug use while on prescription medicines. In the event of worsening symptoms, patient is instructed to call the crisis hotline, 911 and or go to the nearest ED for appropriate evaluation and treatment of symptoms. To follow-up with his/her primary care provider for your other medical issues, concerns and or health care needs.   Signed: Benjamine Mola, FNP 07/22/2018, 11:00 AM

## 2018-07-22 NOTE — [facility] Notes (Signed)
  [facility] Adult Case Management Discharge Plan :  Will you be returning to the same living situation after discharge:  No. Pt going to [facility] At discharge, do you have transportation home?: No. Bus pass provided.   Do you have the ability to pay for your medications: Yes,  medicaid  Release of information consent forms completed and in the chart;  Patient's signature needed at discharge.  Patient to Follow up at: Follow-up Information    [facility]. Go in 3 day(s).   Why:  Please attend a walk in appt Monday-Friday at 8:30am.  Please request therapy and medication management services.  Contact information: [place], [place] [address] P: [phone] F: [phone]       [facility] Follow up.   Why:  If you remain in [place], please attend a walk in appt Monday-Friday between 8am-3pm.   Contact information: [place]  [address] [phone]           Next level of care provider has access to [place] and Suicide Prevention discussed: No. Pt declined consent.       Has patient been referred to the Quitline?: Patient refused referral  Patient has been referred for addiction treatment: Yes, [facility]  Joanne Chars, [place] 07/22/2018, 9:59 AM

## 2018-07-25 DIAGNOSIS — F1994 Other psychoactive substance use, unspecified with psychoactive substance-induced mood disorder: Secondary | ICD-10-CM | POA: Insufficient documentation

## 2018-09-06 ENCOUNTER — Inpatient Hospital Stay (HOSPITAL_COMMUNITY)
Admission: EM | Admit: 2018-09-06 | Discharge: 2018-09-11 | DRG: 918 | Disposition: A | Discharge status: Other Acute Inpatient Hospital | Payer: Medicaid Other | Attending: Internal Medicine | Admitting: Internal Medicine

## 2018-09-06 ENCOUNTER — Encounter (HOSPITAL_COMMUNITY): Payer: Self-pay | Admitting: Emergency Medicine

## 2018-09-06 ENCOUNTER — Emergency Department (HOSPITAL_COMMUNITY): Payer: Medicaid Other

## 2018-09-06 DIAGNOSIS — T43222A Poisoning by selective serotonin reuptake inhibitors, intentional self-harm, initial encounter: Secondary | ICD-10-CM | POA: Diagnosis not present

## 2018-09-06 DIAGNOSIS — F10231 Alcohol dependence with withdrawal delirium: Secondary | ICD-10-CM | POA: Diagnosis not present

## 2018-09-06 DIAGNOSIS — F4310 Post-traumatic stress disorder, unspecified: Secondary | ICD-10-CM | POA: Diagnosis present

## 2018-09-06 DIAGNOSIS — F10939 Alcohol use, unspecified with withdrawal, unspecified: Secondary | ICD-10-CM

## 2018-09-06 DIAGNOSIS — Z881 Allergy status to other antibiotic agents status: Secondary | ICD-10-CM

## 2018-09-06 DIAGNOSIS — T510X2A Toxic effect of ethanol, intentional self-harm, initial encounter: Secondary | ICD-10-CM | POA: Diagnosis present

## 2018-09-06 DIAGNOSIS — Z79899 Other long term (current) drug therapy: Secondary | ICD-10-CM | POA: Diagnosis not present

## 2018-09-06 DIAGNOSIS — R1013 Epigastric pain: Secondary | ICD-10-CM | POA: Diagnosis not present

## 2018-09-06 DIAGNOSIS — R05 Cough: Secondary | ICD-10-CM | POA: Diagnosis not present

## 2018-09-06 DIAGNOSIS — F419 Anxiety disorder, unspecified: Secondary | ICD-10-CM | POA: Diagnosis present

## 2018-09-06 DIAGNOSIS — Z915 Personal history of self-harm: Secondary | ICD-10-CM

## 2018-09-06 DIAGNOSIS — Z903 Acquired absence of stomach [part of]: Secondary | ICD-10-CM

## 2018-09-06 DIAGNOSIS — D72829 Elevated white blood cell count, unspecified: Secondary | ICD-10-CM | POA: Diagnosis not present

## 2018-09-06 DIAGNOSIS — J441 Chronic obstructive pulmonary disease with (acute) exacerbation: Secondary | ICD-10-CM | POA: Diagnosis present

## 2018-09-06 DIAGNOSIS — Z808 Family history of malignant neoplasm of other organs or systems: Secondary | ICD-10-CM | POA: Diagnosis not present

## 2018-09-06 DIAGNOSIS — Z825 Family history of asthma and other chronic lower respiratory diseases: Secondary | ICD-10-CM | POA: Diagnosis not present

## 2018-09-06 DIAGNOSIS — K219 Gastro-esophageal reflux disease without esophagitis: Secondary | ICD-10-CM | POA: Diagnosis present

## 2018-09-06 DIAGNOSIS — Z801 Family history of malignant neoplasm of trachea, bronchus and lung: Secondary | ICD-10-CM | POA: Diagnosis not present

## 2018-09-06 DIAGNOSIS — T50902A Poisoning by unspecified drugs, medicaments and biological substances, intentional self-harm, initial encounter: Secondary | ICD-10-CM

## 2018-09-06 DIAGNOSIS — F10239 Alcohol dependence with withdrawal, unspecified: Secondary | ICD-10-CM | POA: Diagnosis not present

## 2018-09-06 DIAGNOSIS — Z91030 Bee allergy status: Secondary | ICD-10-CM | POA: Diagnosis not present

## 2018-09-06 DIAGNOSIS — K5900 Constipation, unspecified: Secondary | ICD-10-CM | POA: Diagnosis not present

## 2018-09-06 DIAGNOSIS — F1020 Alcohol dependence, uncomplicated: Secondary | ICD-10-CM | POA: Diagnosis not present

## 2018-09-06 DIAGNOSIS — F332 Major depressive disorder, recurrent severe without psychotic features: Secondary | ICD-10-CM | POA: Diagnosis present

## 2018-09-06 DIAGNOSIS — Z87892 Personal history of anaphylaxis: Secondary | ICD-10-CM

## 2018-09-06 DIAGNOSIS — T50901A Poisoning by unspecified drugs, medicaments and biological substances, accidental (unintentional), initial encounter: Secondary | ICD-10-CM | POA: Diagnosis present

## 2018-09-06 DIAGNOSIS — R059 Cough, unspecified: Secondary | ICD-10-CM

## 2018-09-06 DIAGNOSIS — R109 Unspecified abdominal pain: Secondary | ICD-10-CM

## 2018-09-06 DIAGNOSIS — R45851 Suicidal ideations: Secondary | ICD-10-CM

## 2018-09-06 DIAGNOSIS — I2510 Atherosclerotic heart disease of native coronary artery without angina pectoris: Secondary | ICD-10-CM | POA: Diagnosis present

## 2018-09-06 DIAGNOSIS — Y9259 Other trade areas as the place of occurrence of the external cause: Secondary | ICD-10-CM | POA: Diagnosis not present

## 2018-09-06 DIAGNOSIS — Z720 Tobacco use: Secondary | ICD-10-CM | POA: Diagnosis not present

## 2018-09-06 DIAGNOSIS — Z880 Allergy status to penicillin: Secondary | ICD-10-CM | POA: Diagnosis not present

## 2018-09-06 DIAGNOSIS — T50902D Poisoning by unspecified drugs, medicaments and biological substances, intentional self-harm, subsequent encounter: Secondary | ICD-10-CM | POA: Diagnosis not present

## 2018-09-06 DIAGNOSIS — R9431 Abnormal electrocardiogram [ECG] [EKG]: Secondary | ICD-10-CM | POA: Diagnosis present

## 2018-09-06 LAB — CBC
HCT: 43.2 % (ref 39.0–52.0)
Hemoglobin: 14.1 g/dL (ref 13.0–17.0)
MCH: 27.7 pg (ref 26.0–34.0)
MCHC: 32.6 g/dL (ref 30.0–36.0)
MCV: 84.9 fL (ref 80.0–100.0)
Platelets: 338 10*3/uL (ref 150–400)
RBC: 5.09 MIL/uL (ref 4.22–5.81)
RDW: 20.4 % — ABNORMAL HIGH (ref 11.5–15.5)
WBC: 19.1 10*3/uL — ABNORMAL HIGH (ref 4.0–10.5)
nRBC: 0 % (ref 0.0–0.2)

## 2018-09-06 LAB — COMPREHENSIVE METABOLIC PANEL
ALT: 20 U/L (ref 0–44)
AST: 25 U/L (ref 15–41)
Albumin: 4.1 g/dL (ref 3.5–5.0)
Alkaline Phosphatase: 75 U/L (ref 38–126)
Anion gap: 14 (ref 5–15)
BUN: 10 mg/dL (ref 8–23)
CO2: 27 mmol/L (ref 22–32)
Calcium: 9.1 mg/dL (ref 8.9–10.3)
Chloride: 93 mmol/L — ABNORMAL LOW (ref 98–111)
Creatinine, Ser: 0.78 mg/dL (ref 0.61–1.24)
GFR calc Af Amer: >60 mL/min (ref >60)
GFR calc non Af Amer: >60 mL/min (ref >60)
Glucose, Bld: 124 mg/dL — ABNORMAL HIGH (ref 70–99)
Potassium: 4 mmol/L (ref 3.5–5.1)
Sodium: 134 mmol/L — ABNORMAL LOW (ref 135–145)
TOTAL PROTEIN: 7.7 g/dL (ref 6.5–8.1)
Total Bilirubin: 0.9 mg/dL (ref 0.3–1.2)

## 2018-09-06 LAB — CBG MONITORING, ED: Glucose-Capillary: 96 mg/dL (ref 70–99)

## 2018-09-06 LAB — TROPONIN I: Troponin I: <0.03 ng/mL (ref <0.03)

## 2018-09-06 LAB — ETHANOL

## 2018-09-06 LAB — SALICYLATE LEVEL: Salicylate Lvl: <7 mg/dL (ref 2.8–30.0)

## 2018-09-06 LAB — ACETAMINOPHEN LEVEL: Acetaminophen (Tylenol), Serum: <10 ug/mL — ABNORMAL LOW (ref 10–30)

## 2018-09-06 LAB — MRSA PCR SCREENING: MRSA by PCR: NEGATIVE

## 2018-09-06 MED ORDER — THIAMINE HCL 100 MG/ML IJ SOLN
Freq: Once | INTRAVENOUS | Status: AC
  Administered 2018-09-06: 16:00:00 via INTRAVENOUS
  Filled 2018-09-06: qty 1000

## 2018-09-06 MED ORDER — LORAZEPAM 2 MG/ML IJ SOLN
0.0000 mg | Freq: Four times a day (QID) | INTRAMUSCULAR | Status: DC
  Administered 2018-09-06 (×2): 2 mg via INTRAVENOUS
  Filled 2018-09-06 (×2): qty 1

## 2018-09-06 MED ORDER — ONDANSETRON HCL 4 MG/2ML IJ SOLN: 4.0000 mg | Freq: Four times a day (QID) | INTRAMUSCULAR | Status: DC | PRN

## 2018-09-06 MED ORDER — LORAZEPAM 1 MG PO TABS: 0.0000 mg | ORAL_TABLET | Freq: Four times a day (QID) | ORAL | Status: DC

## 2018-09-06 MED ORDER — IPRATROPIUM-ALBUTEROL 0.5-2.5 (3) MG/3ML IN SOLN
3.0000 mL | Freq: Two times a day (BID) | RESPIRATORY_TRACT | Status: DC
  Administered 2018-09-07 – 2018-09-11 (×9): 3 mL via RESPIRATORY_TRACT
  Filled 2018-09-06 (×9): qty 3

## 2018-09-06 MED ORDER — LORAZEPAM 1 MG PO TABS: 0.0000 mg | ORAL_TABLET | Freq: Two times a day (BID) | ORAL | Status: DC

## 2018-09-06 MED ORDER — IPRATROPIUM-ALBUTEROL 0.5-2.5 (3) MG/3ML IN SOLN
3.0000 mL | Freq: Four times a day (QID) | RESPIRATORY_TRACT | Status: DC
  Administered 2018-09-06: 3 mL via RESPIRATORY_TRACT
  Filled 2018-09-06: qty 3

## 2018-09-06 MED ORDER — ENOXAPARIN SODIUM 40 MG/0.4ML ~~LOC~~ SOLN
40.0000 mg | SUBCUTANEOUS | Status: DC
  Administered 2018-09-06 – 2018-09-10 (×5): 40 mg via SUBCUTANEOUS
  Filled 2018-09-06 (×5): qty 0.4

## 2018-09-06 MED ORDER — PROPRANOLOL HCL 10 MG PO TABS
10.0000 mg | ORAL_TABLET | Freq: Three times a day (TID) | ORAL | Status: DC
  Administered 2018-09-06 – 2018-09-08 (×5): 10 mg via ORAL
  Filled 2018-09-06 (×7): qty 1

## 2018-09-06 MED ORDER — LORAZEPAM 2 MG/ML IJ SOLN: 0.0000 mg | Freq: Two times a day (BID) | INTRAMUSCULAR | Status: DC

## 2018-09-06 MED ORDER — NICOTINE 21 MG/24HR TD PT24
21.0000 mg | MEDICATED_PATCH | Freq: Every day | TRANSDERMAL | Status: DC
  Administered 2018-09-06 – 2018-09-11 (×6): 21 mg via TRANSDERMAL
  Filled 2018-09-06 (×6): qty 1

## 2018-09-06 MED ORDER — ONDANSETRON HCL 4 MG PO TABS
4.0000 mg | ORAL_TABLET | Freq: Four times a day (QID) | ORAL | Status: DC | PRN
  Administered 2018-09-09: 4 mg via ORAL
  Filled 2018-09-06: qty 1

## 2018-09-06 MED ORDER — ADULT MULTIVITAMIN W/MINERALS CH
1.0000 | ORAL_TABLET | Freq: Every day | ORAL | Status: DC
  Administered 2018-09-06 – 2018-09-11 (×6): 1 via ORAL
  Filled 2018-09-06 (×6): qty 1

## 2018-09-06 MED ORDER — BUDESONIDE 0.25 MG/2ML IN SUSP
0.2500 mg | Freq: Two times a day (BID) | RESPIRATORY_TRACT | Status: DC
  Administered 2018-09-06: 0.25 mg via RESPIRATORY_TRACT
  Filled 2018-09-06: qty 2

## 2018-09-06 MED ORDER — SODIUM CHLORIDE 0.9 % IV BOLUS
1000.0000 mL | Freq: Once | INTRAVENOUS | Status: AC
  Administered 2018-09-06: 1000 mL via INTRAVENOUS

## 2018-09-06 MED ORDER — SODIUM CHLORIDE 0.9 % IV SOLN
100.0000 mg | Freq: Two times a day (BID) | INTRAVENOUS | Status: DC
  Filled 2018-09-06: qty 100

## 2018-09-06 MED ORDER — LORAZEPAM 2 MG/ML IJ SOLN
1.0000 mg | Freq: Once | INTRAMUSCULAR | Status: AC
  Administered 2018-09-06: 1 mg via INTRAVENOUS
  Filled 2018-09-06: qty 1

## 2018-09-06 MED ORDER — SODIUM CHLORIDE 0.9 % IV SOLN
100.0000 mg | Freq: Two times a day (BID) | INTRAVENOUS | Status: DC
  Administered 2018-09-06 – 2018-09-08 (×4): 100 mg via INTRAVENOUS
  Filled 2018-09-06 (×5): qty 100

## 2018-09-06 MED ORDER — SODIUM CHLORIDE 0.9 % IV SOLN
INTRAVENOUS | Status: DC
  Administered 2018-09-06 – 2018-09-07 (×2): via INTRAVENOUS

## 2018-09-06 MED ORDER — VITAMIN B-1 100 MG PO TABS
100.0000 mg | ORAL_TABLET | Freq: Every day | ORAL | Status: DC
  Administered 2018-09-07 – 2018-09-11 (×5): 100 mg via ORAL
  Filled 2018-09-06 (×5): qty 1

## 2018-09-06 MED ORDER — THIAMINE HCL 100 MG/ML IJ SOLN
100.0000 mg | Freq: Every day | INTRAMUSCULAR | Status: DC
  Administered 2018-09-06: 100 mg via INTRAVENOUS
  Filled 2018-09-06 (×3): qty 2

## 2018-09-06 MED ORDER — METHYLPREDNISOLONE SODIUM SUCC 40 MG IJ SOLR
40.0000 mg | Freq: Two times a day (BID) | INTRAMUSCULAR | Status: DC
  Administered 2018-09-06 – 2018-09-09 (×6): 40 mg via INTRAVENOUS
  Filled 2018-09-06 (×6): qty 1

## 2018-09-06 MED ORDER — LORAZEPAM 2 MG/ML IJ SOLN
2.0000 mg | INTRAMUSCULAR | Status: DC | PRN
  Administered 2018-09-07 (×6): 2 mg via INTRAVENOUS
  Administered 2018-09-08: 3 mg via INTRAVENOUS
  Filled 2018-09-06 (×5): qty 1
  Filled 2018-09-06: qty 2
  Filled 2018-09-06 (×2): qty 1

## 2018-09-06 NOTE — H&P (Addendum)
History and Physical    Samuel Reyes IOE:[id number] DOB: 11-02-1955 DOA: 09/06/2018  I have briefly reviewed the patient's prior medical records in [place]  PCP: Patient, No Pcp Per  Patient coming from: motel  Chief Complaint: overdose  HPI: Samuel Reyes is a 63 y.o. male with medical history significant of COPD, tobacco use, chronic alcohol use, coronary artery disease, presents to the hospital with chief complaint of overdose.  Patient is mildly confused on my evaluation in the ED, however he is able to tell me that last night he wanted to end it all, took a bunch of pills and started drinking alcohol until he passed out.  He cannot tell me how many pills and what pills he took, he cannot even tell me if he took pills from 1 bottle or multiple.  Per EDP, he mentioned to EMS something about Cymbalta.  He is quite upset that he was not successful, and thinks that someone from his motel found him and called EMS.  He appears tremulous on my evaluation.  He responds yes to almost all review of system questions, complaints of chest pain, complains of shortness of breath, complains of a productive cough, complains of abdominal pain, nausea and vomiting.  He states that he had a fever as well.  ED Course: In the emergency room patient is afebrile, tachycardic to low 100s and tachypneic.  His blood pressure is normal.  He is satting well on room air.  His blood work is pertinent for a white count of 19.  Tylenol, salicylate, alcohol levels are negative.  He was placed on Ativan, sitter was at bedside and we are asked to admit for suicidal ideation and overdose.  Review of Systems: Unable to obtain comprehensive review of systems due to mild confusion  Past Medical History:  Diagnosis Date  . Alcoholism ([place])   . CAD (coronary artery disease) 2002   MI, no intervention required  . COPD (chronic obstructive pulmonary disease) (HCC)    not on home O2  . Depression   . GERD  (gastroesophageal reflux disease)   . Headache   . Hypertension   . Stroke [facility] 2006    Past Surgical History:  Procedure Laterality Date  . BACK SURGERY     3 cervical spine surgeries C4-C5 fused  . COLONOSCOPY N/A 01/04/2014   Procedure: COLONOSCOPY;  Surgeon: Danie Binder, MD;  [facility];  Service: Endoscopy;  Laterality: N/A;  1:45  . ESOPHAGOGASTRODUODENOSCOPY N/A 01/04/2014   Procedure: ESOPHAGOGASTRODUODENOSCOPY (EGD);  Surgeon: Danie Binder, MD;  Location: [facility];  Service: Endoscopy;  Laterality: N/A;  . FINGER SURGERY Left    2nd, 3rd, & 4th fingers were cut off by table saw and reattached  . GASTRECTOMY    . HERNIA REPAIR    . INCISIONAL HERNIA REPAIR N/A 01/20/2014   Procedure: LAPAROSCOPIC RECURRENT  INCISIONAL HERNIA with mesh;  Surgeon: Edward Jolly, MD;  Location: [facility];  Service: General;  Laterality: N/A;  . rt knee arthroscopic surgery    . SHOULDER SURGERY Bilateral    3 surgeries on on left, 2 surgeries on right      reports that he has been smoking cigarettes. He started smoking about 52 years ago. He has a 104.00 pack-year smoking history. He has never used smokeless tobacco. He reports current alcohol use of about 3.0 - 4.0 standard drinks of alcohol per week. He reports that he does not use drugs.  Allergies  Allergen Reactions  . Bee Venom Anaphylaxis  . Penicillins Rash    Has patient had a PCN reaction causing immediate rash, facial/tongue/throat swelling, SOB or lightheadedness with hypotension: {Yes Has patient had a PCN reaction causing severe rash involving mucus membranes or skin necrosis: YES Has patient had a PCN reaction that required hospitalization Yes Has patient had a PCN reaction occurring within the last 10 years: YES If all of the above answers are "NO", then may proceed with Cephalosporin use.   . Vancomycin Tinitus    Family History  Problem Relation Age of Onset  . Cancer Father        bone  .  Cancer Brother        lungs  . Stroke Maternal Grandmother   . Asthma Son        died at age 79 in his sleep   . Spina bifida Son        died at age 74   . Dementia Mother   . Colon cancer Neg Hx     Prior to Admission medications   Medication Sig Start Date End Date Taking? Authorizing Provider  traZODone (DESYREL) 100 MG tablet Take 1 tablet (100 mg total) by mouth at bedtime as needed for sleep. 07/22/18  Yes Lindell Spar I, NP  albuterol (PROVENTIL HFA;VENTOLIN HFA) 108 (90 Base) MCG/ACT inhaler Inhale 2 puffs into the lungs every 4 (four) hours as needed for wheezing or shortness of breath. 07/22/18   Lindell Spar I, NP  citalopram (CELEXA) 20 MG tablet Take 1 tablet (20 mg total) by mouth daily. For depression 07/23/18   Lindell Spar I, NP  hydrocortisone cream 1 % Apply topically 3 (three) times daily as needed for itching. 07/22/18   Lindell Spar I, NP  Multiple Vitamin (MULTIVITAMIN WITH MINERALS) TABS tablet Take 1 tablet by mouth daily. Vitamin supplementation 07/23/18   Lindell Spar I, NP  propranolol (INDERAL) 10 MG tablet Take 1 tablet (10 mg total) by mouth 3 (three) times daily. For anxiety 07/22/18   Lindell Spar I, NP  tiotropium (SPIRIVA) 18 MCG inhalation capsule Place 1 capsule (18 mcg total) into inhaler and inhale daily. For COPD 07/23/18   Lindell Spar I, NP    Physical Exam: Vitals:   09/06/18 1409 09/06/18 1440 09/06/18 1525 09/06/18 1607  BP: (!) 148/87 (!) 150/82 133/81 136/87  Pulse: (!) 104 (!) 104 (!) 114 (!) 117  Resp: (!) 24 18 (!) 26 18  Temp:      TempSrc:      SpO2: 92% 94% 92% 100%  Weight:      Height:        Constitutional: Tremulous, restless Eyes: PERRL, lids and conjunctivae injected ENMT: Mucous membranes are dry Neck: normal, supple Respiratory: Coarse breath sounds bilaterally, wheezing present bilaterally, tachypneic Cardiovascular: Regular rate and rhythm, no murmurs / rubs / gallops. No extremity edema. 2+ pedal pulses.   Tachycardic Abdomen: Mild tenderness throughout, no guarding or rebound, bowel sounds positive.  Musculoskeletal: no clubbing / cyanosis.   Skin: no rashes Neurologic: Nonfocal, equal strength Psychiatric: Appears tremulous and anxious.  Labs on Admission: I have personally reviewed following labs and imaging studies  CBC: Recent Labs  Lab 09/06/18 1246  WBC 19.1*  HGB 14.1  HCT 43.2  MCV 84.9  PLT 967   Basic Metabolic Panel: Recent Labs  Lab 09/06/18 1246  NA 134*  K 4.0  CL 93*  CO2 27  GLUCOSE 124*  BUN 10  CREATININE 0.78  CALCIUM 9.1   GFR: Estimated Creatinine Clearance: 82.2 mL/min (by C-G formula based on SCr of 0.78 mg/dL). Liver Function Tests: Recent Labs  Lab 09/06/18 1246  AST 25  ALT 20  ALKPHOS 75  BILITOT 0.9  PROT 7.7  ALBUMIN 4.1   No results for input(s): LIPASE, AMYLASE in the last 168 hours. No results for input(s): AMMONIA in the last 168 hours. Coagulation Profile: No results for input(s): INR, PROTIME in the last 168 hours. Cardiac Enzymes: No results for input(s): CKTOTAL, CKMB, CKMBINDEX, TROPONINI in the last 168 hours. BNP (last 3 results) No results for input(s): PROBNP in the last 8760 hours. HbA1C: No results for input(s): HGBA1C in the last 72 hours. CBG: Recent Labs  Lab 09/06/18 1418  GLUCAP 96   Lipid Profile: No results for input(s): CHOL, HDL, LDLCALC, TRIG, CHOLHDL, LDLDIRECT in the last 72 hours. Thyroid Function Tests: No results for input(s): TSH, T4TOTAL, FREET4, T3FREE, THYROIDAB in the last 72 hours. Anemia Panel: No results for input(s): VITAMINB12, FOLATE, FERRITIN, TIBC, IRON, RETICCTPCT in the last 72 hours. Urine analysis:    Component Value Date/Time   COLORURINE STRAW (A) 05/21/2018 1515   APPEARANCEUR CLEAR 05/21/2018 1515   LABSPEC 1.003 (L) 05/21/2018 1515   PHURINE 6.0 05/21/2018 1515   GLUCOSEU NEGATIVE 05/21/2018 1515   HGBUR NEGATIVE 05/21/2018 1515   BILIRUBINUR NEGATIVE 05/21/2018  1515   KETONESUR 5 (A) 05/21/2018 1515   PROTEINUR NEGATIVE 05/21/2018 1515   UROBILINOGEN 0.2 05/31/2015 2156   NITRITE NEGATIVE 05/21/2018 1515   LEUKOCYTESUR NEGATIVE 05/21/2018 1515     Radiological Exams on Admission: No results found.  EKG: Independently reviewed. Sinus rhythm   Assessment/Plan Active Problems:   Overdose   Principal Problem Overdose in the setting of depression and suicidal intent -Patient to the hospital, it is unclear what medication and how much he took as he is not straightforward with me -Monitor in stepdown, continue sitter, psychiatry consulted  Active Problems Alcohol abuse -Patient drinks more than 40 beers per day.  He is tremulous and his alcohol level is negative in the ED.  Placed on CIWA, continue in stepdown  COPD exacerbation -Patient with wheezing on exam, complains of a productive cough, placed on nebulizer, IV steroids and antibiotics  Tobacco abuse -Nicotine patch  Anxiety -He is on propranolol for this, I will continue given tachycardia  Coronary artery disease -Patient also complaining of chest pain, will cycle cardiac enzymes   DVT prophylaxis: Lovenox  Code Status: Automatic full code  Family Communication: no family at bedside  Disposition Plan: admit to SDU, discharge TBD Consults called: psychiatry     Marzetta Board, MD, PhD [facility]  Contact via [email or web address]  [facility] Office Info P: [phone]  F: [phone]   09/06/2018, 4:21 PM

## 2018-09-06 NOTE — ED Notes (Signed)
Bed: WA15 Expected date:  Expected time:  Means of arrival:  Comments: 

## 2018-09-06 NOTE — ED Provider Notes (Signed)
[facility] DEPT Provider Note   CSN: [id number] Arrival date & time: 09/06/18  1221     History   Chief Complaint Chief Complaint  Patient presents with  . Drug Overdose  . Suicidal    HPI Samuel Reyes is a 63 y.o. male.  HPI Patient reports that he is "tired of living".  He reports that he overdosed on medications this morning.  There is triage report of the patient having stated that he took what was left in his citalopram bottle.  These were 40 mg tablets.  However, when I asked about what the patient ingested and what time, he was very nonspecific.  He described putting some of the medications in the palm of his hand and taking them.  He did not commit to any time precisely indicated some time maybe between 6 AM and 10 AM.  Patient is a daily alcohol user.  He reports drinking 48 beers per day.  He reports last alcohol consumption was at about 1 or 2 in the morning.  He denies he is actually been sick recently.  He reports he did vomit once yesterday.  He reports he feels a little nauseated but denies any other exact symptoms at this time. Past Medical History:  Diagnosis Date  . Alcoholism ([place])   . CAD (coronary artery disease) 2002   MI, no intervention required  . COPD (chronic obstructive pulmonary disease) (HCC)    not on home O2  . Depression   . GERD (gastroesophageal reflux disease)   . Headache   . Hypertension   . Stroke [facility]) 2006    Patient Active Problem List   Diagnosis Date Noted  . MDD (major depressive disorder), recurrent episode, severe ([place]) 07/18/2018  . MDD (major depressive disorder), recurrent severe, without psychosis ([place]) 08/26/2017  . Alcohol withdrawal ([place]) 08/08/2017  . COPD with acute bronchitis ([place]) 07/29/2017  . Alcohol use   . Homelessness 06/24/2017  . Lung nodule 09/03/2016  . Tobacco abuse 09/03/2016  . Insomnia 09/03/2016  . HCAP (healthcare-associated pneumonia) 08/22/2016  . Bronchiolitis  08/21/2016  . Malnutrition of moderate degree 08/04/2016  . Atrial tachycardia ([place]) 08/02/2016  . Impaired glucose tolerance 08/02/2016  . Alcohol abuse with alcohol-induced mood disorder ([place]) 12/12/2015  . Syncope 11/15/2015  . Chest pain 11/15/2015  . Nausea vomiting and diarrhea 11/15/2015  . Abdominal pain 11/15/2015  . Major depressive disorder, recurrent episode, moderate with anxious distress ([place]) 10/30/2015  . Alcohol use disorder, severe, dependence ([place]) 10/29/2015  . COPD exacerbation ([place]) 09/20/2015  . Acute respiratory failure with hypoxia ([place]) 09/18/2015  . Suicidal ideation 09/17/2015  . Alcohol intoxication ([place]) 09/17/2015  . Depression 09/17/2015  . Benign essential HTN 09/17/2015  . Hypokalemia 09/17/2015  . Hyponatremia 09/17/2015  . Coffee ground emesis 09/17/2015  . COPD (chronic obstructive pulmonary disease) ([place]) 02/17/2013  . GERD (gastroesophageal reflux disease) 02/17/2013    Past Surgical History:  Procedure Laterality Date  . BACK SURGERY     3 cervical spine surgeries C4-C5 fused  . COLONOSCOPY N/A 01/04/2014   Procedure: COLONOSCOPY;  Surgeon: Danie Binder, MD;  [facility];  Service: Endoscopy;  Laterality: N/A;  1:45  . ESOPHAGOGASTRODUODENOSCOPY N/A 01/04/2014   Procedure: ESOPHAGOGASTRODUODENOSCOPY (EGD);  Surgeon: Danie Binder, MD;  Location[facility];  Service: Endoscopy;  Laterality: N/A;  . FINGER SURGERY Left    2nd, 3rd, & 4th fingers were cut off by table saw and reattached  .  GASTRECTOMY    . HERNIA REPAIR    . INCISIONAL HERNIA REPAIR N/A 01/20/2014   Procedure: LAPAROSCOPIC RECURRENT  INCISIONAL HERNIA with mesh;  Surgeon: Edward Jolly, MD;  Location: [facility];  Service: General;  Laterality: N/A;  . rt knee arthroscopic surgery    . SHOULDER SURGERY Bilateral    3 surgeries on on left, 2 surgeries on right         Home Medications    Prior to Admission medications   Medication Sig Start Date End  Date Taking? Authorizing Provider  traZODone (DESYREL) 100 MG tablet Take 1 tablet (100 mg total) by mouth at bedtime as needed for sleep. 07/22/18  Yes Lindell Spar I, NP  albuterol (PROVENTIL HFA;VENTOLIN HFA) 108 (90 Base) MCG/ACT inhaler Inhale 2 puffs into the lungs every 4 (four) hours as needed for wheezing or shortness of breath. 07/22/18   Lindell Spar I, NP  citalopram (CELEXA) 20 MG tablet Take 1 tablet (20 mg total) by mouth daily. For depression 07/23/18   Lindell Spar I, NP  hydrocortisone cream 1 % Apply topically 3 (three) times daily as needed for itching. 07/22/18   Lindell Spar I, NP  Multiple Vitamin (MULTIVITAMIN WITH MINERALS) TABS tablet Take 1 tablet by mouth daily. Vitamin supplementation 07/23/18   Lindell Spar I, NP  propranolol (INDERAL) 10 MG tablet Take 1 tablet (10 mg total) by mouth 3 (three) times daily. For anxiety 07/22/18   Lindell Spar I, NP  tiotropium (SPIRIVA) 18 MCG inhalation capsule Place 1 capsule (18 mcg total) into inhaler and inhale daily. For COPD 07/23/18   Lindell Spar I, NP    Family History Family History  Problem Relation Age of Onset  . Cancer Father        bone  . Cancer Brother        lungs  . Stroke Maternal Grandmother   . Asthma Son        died at age 79 in his sleep   . Spina bifida Son        died at age 34   . Dementia Mother   . Colon cancer Neg Hx     Social History Social History   Tobacco Use  . Smoking status: Current Every Day Smoker    Packs/day: 2.00    Years: 52.00    Pack years: 104.00    Types: Cigarettes    Start date: 07/30/1966  . Smokeless tobacco: Never Used  . Tobacco comment: down to 1/4 ppd  Substance Use Topics  . Alcohol use: Yes    Alcohol/week: 3.0 - 4.0 standard drinks    Types: 3 - 4 Cans of beer per week    Comment: everyday- 3-4 40 oz  . Drug use: No    Comment: denied using any drugs     Allergies   Bee venom; Penicillins; and Vancomycin   Review of Systems Review of Systems 10  Systems reviewed and are negative for acute change except as noted in the HPI.  Physical Exam Updated Vital Signs BP 133/81   Pulse (!) 114   Temp 98.2 F (36.8 C) (Oral)   Resp (!) 26   Ht 5\' 5"  (1.651 m)   Wt 68.5 kg   SpO2 92%   BMI 25.13 kg/m   Physical Exam Constitutional:      Comments: Patient is alert.  He does not have any respiratory distress.  He is not somnolent at this time.  He is disheveled  and deconditioned.  HENT:     Head: Normocephalic and atraumatic.     Mouth/Throat:     Comments: His membranes slightly dry.  Widely patent.  No difficulty with secretions. Eyes:     Extraocular Movements: Extraocular movements intact.     Pupils: Pupils are equal, round, and reactive to light.  Neck:     Musculoskeletal: Neck supple.  Cardiovascular:     Comments: Tachycardia.  No gross rub murmur gallop. Pulmonary:     Comments: No respiratory distress.  Occasional wet cough.  Lungs grossly clear with occasional rhonchi.  Breath sounds somewhat soft at the bases. Abdominal:     General: There is no distension.     Palpations: Abdomen is soft.     Tenderness: There is no abdominal tenderness. There is no guarding.     Comments: Abdomen soft nondistended.  No ascites.  Musculoskeletal:     Comments: No peripheral edema.  Calves soft and nontender.  Extremities warm and dry with good peripheral pulses.  Skin:    General: Skin is warm and dry.  Neurological:     Comments: Patient is alert and oriented x3.  He does not have active confusion at this time.  He has slight tremor of the hands.  He has movements are coordinated and purposeful.  Patient follows commands.  Psychiatric:     Comments: Affect is flat.      ED Treatments / Results  Labs (all labs ordered are listed, but only abnormal results are displayed) Labs Reviewed  COMPREHENSIVE METABOLIC PANEL - Abnormal; Notable for the following components:      Result Value   Sodium 134 (*)    Chloride 93 (*)     Glucose, Bld 124 (*)    All other components within normal limits  ACETAMINOPHEN LEVEL - Abnormal; Notable for the following components:   Acetaminophen (Tylenol), Serum <10 (*)    All other components within normal limits  CBC - Abnormal; Notable for the following components:   WBC 19.1 (*)    RDW 20.4 (*)    All other components within normal limits  ETHANOL  SALICYLATE LEVEL  RAPID URINE DRUG SCREEN, HOSP PERFORMED  URINALYSIS, ROUTINE W REFLEX MICROSCOPIC  CBG MONITORING, ED    EKG EKG Interpretation  Date/Time:  Saturday September 06 2018 12:31:06 EST Ventricular Rate:  105 PR Interval:    QRS Duration: 95 QT Interval:  341 QTC Calculation: 451 R Axis:   84 Text Interpretation:  Sinus tachycardia Borderline right axis deviation Abnormal R-wave progression, early transition no change from old except T waves less acute Confirmed by Charlesetta Shanks [phone]) on 09/06/2018 12:54:46 PM   Radiology No results found.  Procedures Procedures (including critical care time) CRITICAL CARE Performed by: Charlesetta Shanks   Total critical care time: 45 minutes  Critical care time was exclusive of separately billable procedures and treating other patients.  Critical care was necessary to treat or prevent imminent or life-threatening deterioration.  Critical care was time spent personally by me on the following activities: development of treatment plan with patient and/or surrogate as well as nursing, discussions with consultants, evaluation of patient's response to treatment, examination of patient, obtaining history from patient or surrogate, ordering and performing treatments and interventions, ordering and review of laboratory studies, ordering and review of radiographic studies, pulse oximetry and re-evaluation of patient's condition. Medications Ordered in ED Medications  LORazepam (ATIVAN) injection 0-4 mg (2 mg Intravenous Given 09/06/18 1445)    Or  LORazepam (ATIVAN) tablet 0-4  mg ( Oral See Alternative 09/06/18 1445)  LORazepam (ATIVAN) injection 0-4 mg (has no administration in time range)    Or  LORazepam (ATIVAN) tablet 0-4 mg (has no administration in time range)  thiamine (VITAMIN B-1) tablet 100 mg ( Oral See Alternative 09/06/18 1444)    Or  thiamine (B-1) injection 100 mg (100 mg Intravenous Given 09/06/18 1444)  sodium chloride 0.9 % 1,000 mL with thiamine 937 mg, folic acid 1 mg, multivitamins adult 10 mL infusion (has no administration in time range)  sodium chloride 0.9 % bolus 1,000 mL (has no administration in time range)  LORazepam (ATIVAN) injection 1 mg (has no administration in time range)  LORazepam (ATIVAN) injection 1 mg (1 mg Intravenous Given 09/06/18 1540)     Initial Impression / Assessment and Plan / ED Course  I have reviewed the triage vital signs and the nursing notes.  Pertinent labs & imaging results that were available during my care of the patient were reviewed by me and considered in my medical decision making (see chart for details).      Consult: Dr. Cruzita Lederer for admission.  Patient is complex and that he has heavy daily alcohol use.  Per patient's report he drinks 48 beers per day.  At this time, patient is showing mild symptoms of withdrawal.  His mental status is clear but he has slight tremor and tachycardia.  Patient has high risk for severe alcohol withdrawal.  Alcohol currently is negative with patient reporting last use very early in the morning hours.  It is nonspecific as to how much of his medications he is ingested.  At this time, EKG does not show any interval widening.  He does have mild tachycardia which is likely consistent with alcohol withdrawal.  Patient denies review of systems for acute illness.  He does have leukocytosis.  Will need observation for possible infectious etiology although not present on ROS.  Patient will be admitted to stepdown.  Final Clinical Impressions(s) / ED Diagnoses   Final diagnoses:    Intentional drug overdose, initial encounter ([place])  Suicidal ideations  Alcohol withdrawal syndrome with complication [facility])    ED Discharge Orders    None       Charlesetta Shanks, MD 09/06/18 [phone]

## 2018-09-06 NOTE — ED Notes (Signed)
Poison control recommendations: QRS prolongation Sodium bicarb boluses QTC prolongation - check K+ and Mg+ levels and supplement to high normal  Seizures- benzos 24 hr cardiac monitoring

## 2018-09-06 NOTE — ED Notes (Signed)
ED TO INPATIENT HANDOFF REPORT  Name/Age/Gender Samuel Reyes 63 y.o. male  Code Status Code Status History    Date Active Date Inactive Code Status Order ID Comments User Context   07/18/2018 0553 07/22/2018 1521 Full Code [id number]  Rennie Plowman Inpatient   07/17/2018 2319 07/18/2018 0519 Full Code [id number]  Nettie Elm, PA-C ED   05/24/2018 2357 05/26/2018 1428 Full Code [id number]  Virgel Manifold, MD ED   05/21/2018 1719 05/22/2018 1704 Full Code [id number]  Franchot Heidelberg, PA-C ED   08/26/2017 1533 08/30/2017 1013 Full Code [id number]  Ethelene Hal, NP Inpatient   08/23/2017 2023 08/26/2017 1532 Full Code [id number]  Isla Pence, MD ED   08/08/2017 0107 08/09/2017 1900 Full Code [id number]  Etta Quill, DO ED   08/07/2017 1357 08/08/2017 0106 Full Code [id number]  Fransico Meadow, PA-C ED   08/07/2017 1016 08/07/2017 1357 Full Code [id number]  Fransico Meadow, PA-C ED   07/29/2017 2226 07/30/2017 1621 Full Code [id number]  Roney Jaffe, MD Inpatient   06/25/2017 0058 07/01/2017 2114 DNR [id number]  Karmen Bongo, MD Inpatient   05/17/2017 1334 05/17/2017 2251 Full Code [id number]  Recardo Evangelist, PA-C ED   08/21/2016 2053 08/27/2016 1729 Full Code [id number]  Etta Quill, DO ED   08/02/2016 1803 08/06/2016 1523 Full Code [id number]  Orson Eva, MD ED   12/12/2015 1556 12/13/2015 1700 Full Code [id number]  Patrecia Pour, NP Inpatient   12/11/2015 1644 12/12/2015 1556 Full Code [id number]  Larene Pickett, PA-C ED   11/15/2015 2257 11/17/2015 1826 Full Code [id number]  Ivor Costa, MD ED   10/29/2015 1401 11/07/2015 1648 Full Code [id number]  Encarnacion Slates, NP Inpatient   10/28/2015 1749 10/29/2015 1401 Full Code [id number]  Bjorn Loser, RN ED   10/28/2015 1354 10/28/2015 1749 Full Code [id number]  Dowless, Dondra Spry, PA-C ED   09/17/2015 1844 09/22/2015 0006 Full Code [id number]  Robbie Lis, MD Inpatient   09/02/2015 1652 09/07/2015 1950 Full Code [id number]  Samella Parr,  NP Inpatient   09/02/2015 0959 09/02/2015 1652 Full Code [id number]  Jarome Matin, MD ED   06/01/2015 0421 06/01/2015 1148 Full Code [id number]  Little, Wenda Overland, MD ED   03/14/2015 1505 03/18/2015 1729 Full Code [id number]  Patrecia Pour, NP Inpatient   03/13/2015 2244 03/14/2015 1505 Full Code [id number]  Nat Christen, MD ED   02/05/2015 1604 02/07/2015 1407 Full Code [id number]  Annita Brod, MD Inpatient   01/18/2015 2020 01/22/2015 1653 Full Code [id number]  Allyne Gee, MD Inpatient   01/05/2015 0331 01/06/2015 2035 Full Code [id number]  Lavina Hamman, MD ED   01/04/2015 1634 01/05/2015 0331 Full Code [id number]  Vickii Chafe, MD ED   08/31/2014 0818 09/05/2014 1832 DNR [id number]  Melton Alar, PA-C Inpatient   08/31/2014 0744 08/31/2014 0818 Full Code [id number]  Melton Alar, PA-C Inpatient   08/03/2014 1715 08/09/2014 0918 Full Code [id number]  Nicholaus Bloom, MD Inpatient   08/01/2014 1511 08/03/2014 1715 Full Code [id number]  Benjamine Mola, [place] Inpatient   08/01/2014 1059 08/01/2014 1511 Full Code [id number]  Virgel Manifold, MD ED   07/21/2014 0522 07/27/2014 1647 Full Code [id number]  Rise Patience, MD Inpatient   07/20/2014 2117 07/21/2014 0111 Full Code [id number]  Evelina Bucy, MD ED   07/20/2014 1656 07/20/2014 2117 Full Code [id number]  Evelina Bucy, MD ED   03/09/2014 0435 03/12/2014 2043 Full Code [id number]  Gilberto Better,  Zachary George Inpatient   03/08/2014 1614 03/09/2014 0435 Full Code [id number]  Mirna Mires, MD ED   01/20/2014 1213 01/22/2014 1532 Full Code [id number]  Excell Seltzer, MD Inpatient   01/29/2013 1542 01/30/2013 2145 Full Code [id number]  Doy Hutching ED      Home/SNF/Other Home  Chief Complaint SI; overdose  Level of Care/Admitting Diagnosis ED Disposition    ED Disposition Condition [facility] Area: [place] [100102]  Level of Care: Stepdown [14]  Admit to SDU based on following criteria: Severe physiological/psychological  symptoms:  Any diagnosis requiring assessment & intervention at least every 4 hours on an ongoing basis to obtain desired patient outcomes including stability and rehabilitation  Diagnosis: Overdose [702637]  Admitting Physician: Caren Griffins [phone]  Attending Physician: Caren Griffins [phone]  Estimated length of stay: past midnight tomorrow  Certification:: I certify this patient will need inpatient services for at least 2 midnights  PT Class (Do Not Modify): Inpatient [101]  PT Acc Code (Do Not Modify): Private [1]       Medical History Past Medical History:  Diagnosis Date  . Alcoholism ([place])   . CAD (coronary artery disease) 2002   MI, no intervention required  . COPD (chronic obstructive pulmonary disease) (HCC)    not on home O2  . Depression   . GERD (gastroesophageal reflux disease)   . Headache   . Hypertension   . Stroke [facility]) 2006    Allergies Allergies  Allergen Reactions  . Bee Venom Anaphylaxis  . Penicillins Rash    Has patient had a PCN reaction causing immediate rash, facial/tongue/throat swelling, SOB or lightheadedness with hypotension: {Yes Has patient had a PCN reaction causing severe rash involving mucus membranes or skin necrosis: YES Has patient had a PCN reaction that required hospitalization Yes Has patient had a PCN reaction occurring within the last 10 years: YES If all of the above answers are "NO", then may proceed with Cephalosporin use.   . Vancomycin Tinitus    IV Location/Drains/Wounds Patient Lines/Drains/Airways Status   Active Line/Drains/Airways    Name:   Placement date:   Placement time:   Site:   Days:   Peripheral IV 09/06/18 Left;Medial Forearm   09/06/18    1400    Forearm   less than 1          Labs/Imaging Results for orders placed or performed during the hospital encounter of 09/06/18 (from the past 48 hour(s))  Comprehensive metabolic panel     Status: Abnormal   Collection Time: 09/06/18 12:46 PM  Result  Value Ref Range   Sodium 134 (L) 135 - 145 mmol/L   Potassium 4.0 3.5 - 5.1 mmol/L   Chloride 93 (L) 98 - 111 mmol/L   CO2 27 22 - 32 mmol/L   Glucose, Bld 124 (H) 70 - 99 mg/dL   BUN 10 8 - 23 mg/dL   Creatinine, Ser 0.78 0.61 - 1.24 mg/dL   Calcium 9.1 8.9 - 10.3 mg/dL   Total Protein 7.7 6.5 - 8.1 g/dL   Albumin 4.1 3.5 - 5.0 g/dL   AST 25 15 - 41 U/L   ALT 20 0 - 44 U/L   Alkaline Phosphatase 75 38 - 126 U/L   Total Bilirubin 0.9 0.3 - 1.2 mg/dL   GFR calc non Af Amer >60 >60 mL/min   GFR calc Af Amer >60 >60 mL/min   Anion gap 14 5 -  15    Comment: Performed at [facility], [place] [address], [place], [place] [address]  Ethanol     Status: None   Collection Time: 09/06/18 12:46 PM  Result Value Ref Range   Alcohol, Ethyl (B) <10 <10 mg/dL    Comment: (NOTE) Lowest detectable limit for serum alcohol is 10 mg/dL. For medical purposes only. Performed at [facility], [place] [address] [place], [place] [address]   Salicylate level     Status: None   Collection Time: 09/06/18 12:46 PM  Result Value Ref Range   Salicylate Lvl <9.9 2.8 - 30.0 mg/dL    Comment: Performed at [facility], [place] [address], [place], [place] [address]  Acetaminophen level     Status: Abnormal   Collection Time: 09/06/18 12:46 PM  Result Value Ref Range   Acetaminophen (Tylenol), Serum <10 (L) 10 - 30 ug/mL    Comment: (NOTE) Therapeutic concentrations vary significantly. A range of 10-30 ug/mL  may be an effective concentration for many patients. However, some  are best treated at concentrations outside of this range. Acetaminophen concentrations >150 ug/mL at 4 hours after ingestion  and >50 ug/mL at 12 hours after ingestion are often associated with  toxic reactions. Performed at [facility], [place] [address], [place], [place] [address]   cbc     Status: Abnormal   Collection Time: 09/06/18 12:46 PM  Result Value Ref  Range   WBC 19.1 (H) 4.0 - 10.5 K/uL   RBC 5.09 4.22 - 5.81 MIL/uL   Hemoglobin 14.1 13.0 - 17.0 g/dL   HCT 43.2 39.0 - 52.0 %   MCV 84.9 80.0 - 100.0 fL   MCH 27.7 26.0 - 34.0 pg   MCHC 32.6 30.0 - 36.0 g/dL   RDW 20.4 (H) 11.5 - 15.5 %   Platelets 338 150 - 400 K/uL   nRBC 0.0 0.0 - 0.2 %    Comment: Performed at [facility], [place] [address] [place], [place] [address]  CBG monitoring, ED     Status: None   Collection Time: 09/06/18  2:18 PM  Result Value Ref Range   Glucose-Capillary 96 70 - 99 mg/dL   Dg Chest Port 1 View  Result Date: 09/06/2018 CLINICAL DATA:  Leukocytosis, fever. EXAM: PORTABLE CHEST 1 VIEW COMPARISON:  Radiographs May 21, 2018. FINDINGS: The heart size and mediastinal contours are within normal limits. Both lungs are clear. The visualized skeletal structures are unremarkable. IMPRESSION: No active disease. Electronically Signed   By: Marijo Conception, M.D.   On: 09/06/2018 16:53   EKG Interpretation  Date/Time:  Saturday September 06 2018 12:31:06 EST Ventricular Rate:  105 PR Interval:    QRS Duration: 95 QT Interval:  341 QTC Calculation: 451 R Axis:   84 Text Interpretation:  Sinus tachycardia Borderline right axis deviation Abnormal R-wave progression, early transition no change from old except T waves less acute Confirmed by Charlesetta Shanks [phone]) on 09/06/2018 12:54:46 PM   Pending Labs Unresulted Labs (From admission, onward)    Start     Ordered   09/06/18 1608  Urinalysis, Routine w reflex microscopic  Once,   R     09/06/18 1607   09/06/18 1246  Rapid urine drug screen (hospital performed)  Once,   STAT     09/06/18 1249   Signed and Held  HIV antibody (Routine Testing)  Once,   R     Signed and  Held   Signed and Held  Comprehensive metabolic panel  Tomorrow morning,   R     Signed and Held   Signed and Held  CBC  Tomorrow morning,   R     Signed and Held   Signed and Held  Troponin I - Now Then Q6H  Now then every 6  hours,   R     Signed and Held          Vitals/Pain Today's Vitals   09/06/18 1800 09/06/18 1853 09/06/18 1919 09/06/18 1920  BP: 138/78 138/78 (!) 146/78 (!) 146/78  Pulse: (!) 116 (!) 106 (!) 116 (!) 116  Resp: (!) 26 (!) 25 (!) 22   Temp:   99.5 F (37.5 C)   TempSrc:   Oral   SpO2: 93% 93% 94%   Weight:      Height:      PainSc:   9      Isolation Precautions No active isolations  Medications Medications  LORazepam (ATIVAN) injection 0-4 mg (2 mg Intravenous Given 09/06/18 1925)    Or  LORazepam (ATIVAN) tablet 0-4 mg ( Oral See Alternative 09/06/18 1925)  LORazepam (ATIVAN) injection 0-4 mg (has no administration in time range)    Or  LORazepam (ATIVAN) tablet 0-4 mg (has no administration in time range)  thiamine (VITAMIN B-1) tablet 100 mg ( Oral See Alternative 09/06/18 1444)    Or  thiamine (B-1) injection 100 mg (100 mg Intravenous Given 09/06/18 1444)  doxycycline (VIBRAMYCIN) 100 mg in sodium chloride 0.9 % 250 mL IVPB (100 mg Intravenous Transfusing/Transfer 09/06/18 1927)  sodium chloride 0.9 % 1,000 mL with thiamine 921 mg, folic acid 1 mg, multivitamins adult 10 mL infusion ( Intravenous Transfusing/Transfer 09/06/18 1927)  LORazepam (ATIVAN) injection 1 mg (1 mg Intravenous Given 09/06/18 1540)  sodium chloride 0.9 % bolus 1,000 mL (0 mLs Intravenous Stopped 09/06/18 1806)  LORazepam (ATIVAN) injection 1 mg (1 mg Intravenous Given 09/06/18 1713)    Mobility walks

## 2018-09-06 NOTE — ED Triage Notes (Signed)
Pt from motel via EMS after neighbor called and stated that pt had stated he overdosed saying "I don't want to live anymore." EMS reports that pt reports he took Celexa but doesn't know how many. EMS brings pts other prescriptions of gabapentin and folic acid, but pt denies taking any of those medications. Pt confirms SI to EMS. Pt is A&O and in NAD.

## 2018-09-06 NOTE — ED Notes (Signed)
Pt reports that he is "tired of living". Pt reports a previous SI attempt when he lost his son over 6 years ago by cutting wrists. Pt is calm and cooperative at this time. Pt reports that he only took what was left in his Citalopram 40 mg bottle, but is unsure of the amount. Pt adds that he drank beer this am. Pt is calm and cooperative at this time.

## 2018-09-06 NOTE — ED Notes (Signed)
Bed: RESB Expected date:  Expected time:  Means of arrival:  Comments: OD

## 2018-09-07 ENCOUNTER — Other Ambulatory Visit: Payer: Self-pay

## 2018-09-07 ENCOUNTER — Inpatient Hospital Stay (HOSPITAL_COMMUNITY): Payer: Medicaid Other

## 2018-09-07 DIAGNOSIS — Z720 Tobacco use: Secondary | ICD-10-CM

## 2018-09-07 DIAGNOSIS — T50902D Poisoning by unspecified drugs, medicaments and biological substances, intentional self-harm, subsequent encounter: Secondary | ICD-10-CM

## 2018-09-07 DIAGNOSIS — K219 Gastro-esophageal reflux disease without esophagitis: Secondary | ICD-10-CM

## 2018-09-07 DIAGNOSIS — F1020 Alcohol dependence, uncomplicated: Secondary | ICD-10-CM

## 2018-09-07 DIAGNOSIS — F332 Major depressive disorder, recurrent severe without psychotic features: Secondary | ICD-10-CM

## 2018-09-07 LAB — CBC
HCT: 35.6 % — ABNORMAL LOW (ref 39.0–52.0)
Hemoglobin: 11.4 g/dL — ABNORMAL LOW (ref 13.0–17.0)
MCH: 27.7 pg (ref 26.0–34.0)
MCHC: 32 g/dL (ref 30.0–36.0)
MCV: 86.6 fL (ref 80.0–100.0)
NRBC: 0 % (ref 0.0–0.2)
Platelets: 264 10*3/uL (ref 150–400)
RBC: 4.11 MIL/uL — ABNORMAL LOW (ref 4.22–5.81)
RDW: 20.1 % — ABNORMAL HIGH (ref 11.5–15.5)
WBC: 9.7 10*3/uL (ref 4.0–10.5)

## 2018-09-07 LAB — TROPONIN I
Troponin I: <0.03 ng/mL (ref <0.03)
Troponin I: <0.03 ng/mL (ref <0.03)

## 2018-09-07 LAB — HIV ANTIBODY (ROUTINE TESTING W REFLEX): HIV Screen 4th Generation wRfx: NONREACTIVE

## 2018-09-07 LAB — COMPREHENSIVE METABOLIC PANEL
ALT: 17 U/L (ref 0–44)
AST: 19 U/L (ref 15–41)
Albumin: 3.3 g/dL — ABNORMAL LOW (ref 3.5–5.0)
Alkaline Phosphatase: 60 U/L (ref 38–126)
Anion gap: 8 (ref 5–15)
BUN: 10 mg/dL (ref 8–23)
CHLORIDE: 98 mmol/L (ref 98–111)
CO2: 24 mmol/L (ref 22–32)
Calcium: 7.6 mg/dL — ABNORMAL LOW (ref 8.9–10.3)
Creatinine, Ser: 0.76 mg/dL (ref 0.61–1.24)
GFR calc Af Amer: >60 mL/min (ref >60)
Glucose, Bld: 124 mg/dL — ABNORMAL HIGH (ref 70–99)
Potassium: 3.6 mmol/L (ref 3.5–5.1)
Sodium: 130 mmol/L — ABNORMAL LOW (ref 135–145)
Total Bilirubin: 1 mg/dL (ref 0.3–1.2)
Total Protein: 6.2 g/dL — ABNORMAL LOW (ref 6.5–8.1)

## 2018-09-07 LAB — URINALYSIS, ROUTINE W REFLEX MICROSCOPIC
Bilirubin Urine: NEGATIVE
Glucose, UA: NEGATIVE mg/dL
Hgb urine dipstick: NEGATIVE
Ketones, ur: 5 mg/dL — AB
Leukocytes, UA: NEGATIVE
Nitrite: NEGATIVE
PH: 7 (ref 5.0–8.0)
Protein, ur: NEGATIVE mg/dL
Specific Gravity, Urine: 1.017 (ref 1.005–1.030)

## 2018-09-07 LAB — LIPASE, BLOOD: Lipase: 28 U/L (ref 11–51)

## 2018-09-07 LAB — RAPID URINE DRUG SCREEN, HOSP PERFORMED
Amphetamines: NOT DETECTED
Barbiturates: NOT DETECTED
Benzodiazepines: POSITIVE — AB
Cocaine: NOT DETECTED
Opiates: NOT DETECTED
Tetrahydrocannabinol: NOT DETECTED

## 2018-09-07 LAB — MAGNESIUM: Magnesium: 1.8 mg/dL (ref 1.7–2.4)

## 2018-09-07 LAB — GLUCOSE, CAPILLARY: Glucose-Capillary: 183 mg/dL — ABNORMAL HIGH (ref 70–99)

## 2018-09-07 MED ORDER — CHLORDIAZEPOXIDE HCL 25 MG PO CAPS
25.0000 mg | ORAL_CAPSULE | Freq: Three times a day (TID) | ORAL | Status: AC
  Administered 2018-09-08 – 2018-09-09 (×3): 25 mg via ORAL
  Filled 2018-09-07 (×4): qty 1

## 2018-09-07 MED ORDER — LIDOCAINE VISCOUS HCL 2 % MT SOLN: 15.0000 mL | Freq: Four times a day (QID) | OROMUCOSAL | Status: DC | PRN

## 2018-09-07 MED ORDER — HYDROXYZINE HCL 25 MG PO TABS
25.0000 mg | ORAL_TABLET | Freq: Four times a day (QID) | ORAL | Status: AC | PRN
  Administered 2018-09-09: 25 mg via ORAL
  Filled 2018-09-07: qty 1

## 2018-09-07 MED ORDER — MAGNESIUM SULFATE 2 GM/50ML IV SOLN
2.0000 g | Freq: Once | INTRAVENOUS | Status: AC
  Administered 2018-09-07: 2 g via INTRAVENOUS
  Filled 2018-09-07: qty 50

## 2018-09-07 MED ORDER — TRAZODONE HCL 100 MG PO TABS
100.0000 mg | ORAL_TABLET | Freq: Every evening | ORAL | Status: DC | PRN
  Administered 2018-09-07 – 2018-09-10 (×3): 100 mg via ORAL
  Filled 2018-09-07: qty 1
  Filled 2018-09-07 (×2): qty 2

## 2018-09-07 MED ORDER — INFLUENZA VAC SPLIT QUAD 0.5 ML IM SUSY
0.5000 mL | PREFILLED_SYRINGE | INTRAMUSCULAR | Status: DC
  Filled 2018-09-07: qty 0.5

## 2018-09-07 MED ORDER — CHLORDIAZEPOXIDE HCL 25 MG PO CAPS
25.0000 mg | ORAL_CAPSULE | Freq: Every day | ORAL | Status: AC
  Administered 2018-09-10: 25 mg via ORAL
  Filled 2018-09-07: qty 1

## 2018-09-07 MED ORDER — PANTOPRAZOLE SODIUM 40 MG PO TBEC
40.0000 mg | DELAYED_RELEASE_TABLET | Freq: Every day | ORAL | Status: DC
  Administered 2018-09-07: 40 mg via ORAL
  Filled 2018-09-07: qty 1

## 2018-09-07 MED ORDER — CITALOPRAM HYDROBROMIDE 20 MG PO TABS
20.0000 mg | ORAL_TABLET | Freq: Every day | ORAL | Status: DC
  Administered 2018-09-07 – 2018-09-08 (×2): 20 mg via ORAL
  Filled 2018-09-07 (×2): qty 1

## 2018-09-07 MED ORDER — PANTOPRAZOLE SODIUM 40 MG PO TBEC
40.0000 mg | DELAYED_RELEASE_TABLET | Freq: Two times a day (BID) | ORAL | Status: DC
  Administered 2018-09-07 – 2018-09-11 (×8): 40 mg via ORAL
  Filled 2018-09-07 (×8): qty 1

## 2018-09-07 MED ORDER — CHLORDIAZEPOXIDE HCL 25 MG PO CAPS: 25.0000 mg | ORAL_CAPSULE | Freq: Four times a day (QID) | ORAL | Status: AC | PRN

## 2018-09-07 MED ORDER — CHLORDIAZEPOXIDE HCL 25 MG PO CAPS
25.0000 mg | ORAL_CAPSULE | Freq: Four times a day (QID) | ORAL | Status: AC
  Administered 2018-09-07 – 2018-09-08 (×6): 25 mg via ORAL
  Filled 2018-09-07 (×6): qty 1

## 2018-09-07 MED ORDER — GUAIFENESIN ER 600 MG PO TB12
1200.0000 mg | ORAL_TABLET | Freq: Two times a day (BID) | ORAL | Status: DC
  Administered 2018-09-07 – 2018-09-11 (×9): 1200 mg via ORAL
  Filled 2018-09-07 (×9): qty 2

## 2018-09-07 MED ORDER — POTASSIUM CHLORIDE CRYS ER 20 MEQ PO TBCR
40.0000 meq | EXTENDED_RELEASE_TABLET | Freq: Once | ORAL | Status: AC
  Administered 2018-09-07: 40 meq via ORAL
  Filled 2018-09-07: qty 2

## 2018-09-07 MED ORDER — DICLOFENAC SODIUM 1 % TD GEL
2.0000 g | Freq: Every day | TRANSDERMAL | Status: DC
  Administered 2018-09-07 – 2018-09-11 (×5): 2 g via TOPICAL
  Filled 2018-09-07: qty 100

## 2018-09-07 MED ORDER — CHLORDIAZEPOXIDE HCL 25 MG PO CAPS
25.0000 mg | ORAL_CAPSULE | ORAL | Status: AC
  Administered 2018-09-09 – 2018-09-10 (×2): 25 mg via ORAL
  Filled 2018-09-07 (×2): qty 1

## 2018-09-07 MED ORDER — BUDESONIDE 0.5 MG/2ML IN SUSP
0.5000 mg | Freq: Two times a day (BID) | RESPIRATORY_TRACT | Status: DC
  Administered 2018-09-07 – 2018-09-11 (×9): 0.5 mg via RESPIRATORY_TRACT
  Filled 2018-09-07 (×9): qty 2

## 2018-09-07 MED ORDER — LOPERAMIDE HCL 2 MG PO CAPS: 2.0000 mg | ORAL_CAPSULE | ORAL | Status: AC | PRN

## 2018-09-07 MED ORDER — ALUM & MAG HYDROXIDE-SIMETH 200-200-20 MG/5ML PO SUSP
30.0000 mL | Freq: Four times a day (QID) | ORAL | Status: DC | PRN
  Administered 2018-09-07: 30 mL via ORAL
  Filled 2018-09-07: qty 30

## 2018-09-07 MED ORDER — FLUTICASONE PROPIONATE 50 MCG/ACT NA SUSP
2.0000 | Freq: Every day | NASAL | Status: DC
  Administered 2018-09-07 – 2018-09-11 (×4): 2 via NASAL
  Filled 2018-09-07: qty 16

## 2018-09-07 MED ORDER — LORATADINE 10 MG PO TABS
10.0000 mg | ORAL_TABLET | Freq: Every day | ORAL | Status: DC
  Administered 2018-09-07 – 2018-09-11 (×5): 10 mg via ORAL
  Filled 2018-09-07 (×5): qty 1

## 2018-09-07 NOTE — Consult Note (Signed)
[facility] Consult   Reason for Consult:  Suicide attempt by overdose Referring Physician:  Dr. Grandville Silos Patient Identification: Samuel Reyes MRN:  [id number] Principal Diagnosis: Overdose Diagnosis:  Principal Problem:   Overdose Active Problems:   Alcohol use disorder, severe, dependence ([place])   GERD (gastroesophageal reflux disease)   Suicidal ideation   COPD exacerbation ([place])   Tobacco abuse   MDD (major depressive disorder), recurrent severe, without psychosis ([place])   Total Time spent with patient: 45 minutes  Subjective:   Samuel Reyes is a 63 y.o. male patient admitted due to suicide attempt with intentional overdose.   HPI:    Samuel Reyes is a 63 y.o. male with medical history significant of COPD, tobacco use, chronic alcohol use, coronary artery disease, PTSD, MDD, Alcohol use disorder  who was admitted to the hospital after he attempted suicide by overdosing on medications and alcohol. Patient states that he has been depressed for the past several years due to the death of his sone and wife. He reports moving to [place] 2 years ago to be closer to his uncles, who also are now deceased. He is currently on disability and lives alone. He continues to have depression with  suicidal ideations, however he denies psychosis and delusions. He reports drinking 3 days a week with 12 cans of beer at each sitting. He has a previous history of suicide attempt via slitting his wrist 12 years ago. Patient is unable to contract for safety.  Past Psychiatric History:  Patient reports being seen by [facility] for PTSD and depression. Has not been seen recently due to provider being on maternity leave.   Risk to Self:  Present  Risk to Others:  denies Prior Inpatient Therapy:  Yes Prior Outpatient Therapy:  Yes  Past Medical History:  Past Medical History:  Diagnosis Date  . Alcoholism ([place])   . CAD (coronary artery disease) 2002   MI, no intervention required  .  COPD (chronic obstructive pulmonary disease) (HCC)    not on home O2  . Depression   . GERD (gastroesophageal reflux disease)   . Headache   . Hypertension   . Stroke [facility]) 2006    Past Surgical History:  Procedure Laterality Date  . BACK SURGERY     3 cervical spine surgeries C4-C5 fused  . COLONOSCOPY N/A 01/04/2014   Procedure: COLONOSCOPY;  Surgeon: Danie Binder, MD;  Location: [facility];  Service: Endoscopy;  Laterality: N/A;  1:45  . ESOPHAGOGASTRODUODENOSCOPY N/A 01/04/2014   Procedure: ESOPHAGOGASTRODUODENOSCOPY (EGD);  Surgeon: Danie Binder, MD;  Location: [facility];  Service: Endoscopy;  Laterality: N/A;  . FINGER SURGERY Left    2nd, 3rd, & 4th fingers were cut off by table saw and reattached  . GASTRECTOMY    . HERNIA REPAIR    . INCISIONAL HERNIA REPAIR N/A 01/20/2014   Procedure: LAPAROSCOPIC RECURRENT  INCISIONAL HERNIA with mesh;  Surgeon: Edward Jolly, MD;  Location: [facility] ORS;  Service: General;  Laterality: N/A;  . rt knee arthroscopic surgery    . SHOULDER SURGERY Bilateral    3 surgeries on on left, 2 surgeries on right    Family History:  Family History  Problem Relation Age of Onset  . Cancer Father        bone  . Cancer Brother        lungs  . Stroke Maternal Grandmother   . Asthma Son        died at age  22 in his sleep   . Spina bifida Son        died at age 72   . Dementia Mother   . Colon cancer Neg Hx    Family Psychiatric  History:   Social History: reports drinking 3 days per week. 12 beers per sitting.  Social History   Substance and Sexual Activity  Alcohol Use Yes  . Alcohol/week: 3.0 - 4.0 standard drinks  . Types: 3 - 4 Cans of beer per week   Comment: everyday- 3-4 40 oz     Social History   Substance and Sexual Activity  Drug Use No   Comment: denied using any drugs    Social History   Socioeconomic History  . Marital status: Widowed    Spouse name: Not on file  . Number of children: 3  . Years of  education: Not on file  . Highest education level: Not on file  Occupational History  . Occupation: Disability  Social Needs  . Financial resource strain: Not on file  . Food insecurity:    Worry: Not on file    Inability: Not on file  . Transportation needs:    Medical: Yes    Non-medical: Yes  Tobacco Use  . Smoking status: Current Every Day Smoker    Packs/day: 2.00    Years: 52.00    Pack years: 104.00    Types: Cigarettes    Start date: 07/30/1966  . Smokeless tobacco: Never Used  . Tobacco comment: down to 1/4 ppd  Substance and Sexual Activity  . Alcohol use: Yes    Alcohol/week: 3.0 - 4.0 standard drinks    Types: 3 - 4 Cans of beer per week    Comment: everyday- 3-4 40 oz  . Drug use: No    Comment: denied using any drugs  . Sexual activity: Never  Lifestyle  . Physical activity:    Days per week: Not on file    Minutes per session: Not on file  . Stress: Not on file  Relationships  . Social connections:    Talks on phone: Not on file    Gets together: Not on file    Attends religious service: Not on file    Active member of club or organization: Not on file    Attends meetings of clubs or organizations: Not on file    Relationship status: Not on file  Other Topics Concern  . Not on file  Social History Narrative  . Not on file   Additional Social History:    Allergies:   Allergies  Allergen Reactions  . Bee Venom Anaphylaxis  . Penicillins Rash    Has patient had a PCN reaction causing immediate rash, facial/tongue/throat swelling, SOB or lightheadedness with hypotension: {Yes Has patient had a PCN reaction causing severe rash involving mucus membranes or skin necrosis: YES Has patient had a PCN reaction that required hospitalization Yes Has patient had a PCN reaction occurring within the last 10 years: YES If all of the above answers are "NO", then may proceed with Cephalosporin use.   . Vancomycin Tinitus    Labs:  Results for orders placed  or performed during the hospital encounter of 09/06/18 (from the past 48 hour(s))  Comprehensive metabolic panel     Status: Abnormal   Collection Time: 09/06/18 12:46 PM  Result Value Ref Range   Sodium 134 (L) 135 - 145 mmol/L   Potassium 4.0 3.5 - 5.1 mmol/L   Chloride 93 (  L) 98 - 111 mmol/L   CO2 27 22 - 32 mmol/L   Glucose, Bld 124 (H) 70 - 99 mg/dL   BUN 10 8 - 23 mg/dL   Creatinine, Ser 0.78 0.61 - 1.24 mg/dL   Calcium 9.1 8.9 - 10.3 mg/dL   Total Protein 7.7 6.5 - 8.1 g/dL   Albumin 4.1 3.5 - 5.0 g/dL   AST 25 15 - 41 U/L   ALT 20 0 - 44 U/L   Alkaline Phosphatase 75 38 - 126 U/L   Total Bilirubin 0.9 0.3 - 1.2 mg/dL   GFR calc non Af Amer >60 >60 mL/min   GFR calc Af Amer >60 >60 mL/min   Anion gap 14 5 - 15    Comment: Performed at [facility], [place] [address] [place], [place] [address]  Ethanol     Status: None   Collection Time: 09/06/18 12:46 PM  Result Value Ref Range   Alcohol, Ethyl (B) <10 <10 mg/dL    Comment: (NOTE) Lowest detectable limit for serum alcohol is 10 mg/dL. For medical purposes only. Performed at [facility], [place] [address], [place], [place] [address]   Salicylate level     Status: None   Collection Time: 09/06/18 12:46 PM  Result Value Ref Range   Salicylate Lvl <8.2 2.8 - 30.0 mg/dL    Comment: Performed at [facility], [place] [address] [place], [place] [address]  Acetaminophen level     Status: Abnormal   Collection Time: 09/06/18 12:46 PM  Result Value Ref Range   Acetaminophen (Tylenol), Serum <10 (L) 10 - 30 ug/mL    Comment: (NOTE) Therapeutic concentrations vary significantly. A range of 10-30 ug/mL  may be an effective concentration for many patients. However, some  are best treated at concentrations outside of this range. Acetaminophen concentrations >150 ug/mL at 4 hours after ingestion  and >50 ug/mL at 12 hours after ingestion are often associated with  toxic  reactions. Performed at [facility], [place] [address] [place], [place] [address]   cbc     Status: Abnormal   Collection Time: 09/06/18 12:46 PM  Result Value Ref Range   WBC 19.1 (H) 4.0 - 10.5 K/uL   RBC 5.09 4.22 - 5.81 MIL/uL   Hemoglobin 14.1 13.0 - 17.0 g/dL   HCT 43.2 39.0 - 52.0 %   MCV 84.9 80.0 - 100.0 fL   MCH 27.7 26.0 - 34.0 pg   MCHC 32.6 30.0 - 36.0 g/dL   RDW 20.4 (H) 11.5 - 15.5 %   Platelets 338 150 - 400 K/uL   nRBC 0.0 0.0 - 0.2 %    Comment: Performed at [facility], [place] [address], [place], [place] [address]  CBG monitoring, ED     Status: None   Collection Time: 09/06/18  2:18 PM  Result Value Ref Range   Glucose-Capillary 96 70 - 99 mg/dL  MRSA PCR Screening     Status: None   Collection Time: 09/06/18  8:14 PM  Result Value Ref Range   MRSA by PCR NEGATIVE NEGATIVE    Comment:        The GeneXpert MRSA Assay (FDA approved for NASAL specimens only), is one component of a comprehensive MRSA colonization surveillance program. It is not intended to diagnose MRSA infection nor to guide or monitor treatment for MRSA infections. Performed at [facility], [place] [address], [place], [place] [address]  Troponin I - Now Then Q6H     Status: None   Collection Time: 09/06/18  8:28 PM  Result Value Ref Range   Troponin I <0.03 <0.03 ng/mL    Comment: Performed at [facility], [place] [address] [place], [place] [address]  Rapid urine drug screen (hospital performed)     Status: Abnormal   Collection Time: 09/07/18  1:04 AM  Result Value Ref Range   Opiates NONE DETECTED NONE DETECTED   Cocaine NONE DETECTED NONE DETECTED   Benzodiazepines POSITIVE (A) NONE DETECTED   Amphetamines NONE DETECTED NONE DETECTED   Tetrahydrocannabinol NONE DETECTED NONE DETECTED   Barbiturates NONE DETECTED NONE DETECTED    Comment: (NOTE) DRUG SCREEN FOR MEDICAL PURPOSES ONLY.  IF CONFIRMATION IS  NEEDED FOR ANY PURPOSE, NOTIFY LAB WITHIN 5 DAYS. LOWEST DETECTABLE LIMITS FOR URINE DRUG SCREEN Drug Class                     Cutoff (ng/mL) Amphetamine and metabolites    1000 Barbiturate and metabolites    200 Benzodiazepine                 053 Tricyclics and metabolites     300 Opiates and metabolites        300 Cocaine and metabolites        300 THC                            50 Performed at [facility], [place] [address] [place], [place] [address]   Urinalysis, Routine w reflex microscopic     Status: Abnormal   Collection Time: 09/07/18  1:04 AM  Result Value Ref Range   Color, Urine YELLOW YELLOW   APPearance HAZY (A) CLEAR   Specific Gravity, Urine 1.017 1.005 - 1.030   pH 7.0 5.0 - 8.0   Glucose, UA NEGATIVE NEGATIVE mg/dL   Hgb urine dipstick NEGATIVE NEGATIVE   Bilirubin Urine NEGATIVE NEGATIVE   Ketones, ur 5 (A) NEGATIVE mg/dL   Protein, ur NEGATIVE NEGATIVE mg/dL   Nitrite NEGATIVE NEGATIVE   Leukocytes, UA NEGATIVE NEGATIVE    Comment: Performed at [facility] [address] [place], [place] [address]  Comprehensive metabolic panel     Status: Abnormal   Collection Time: 09/07/18  2:15 AM  Result Value Ref Range   Sodium 130 (L) 135 - 145 mmol/L   Potassium 3.6 3.5 - 5.1 mmol/L   Chloride 98 98 - 111 mmol/L   CO2 24 22 - 32 mmol/L   Glucose, Bld 124 (H) 70 - 99 mg/dL   BUN 10 8 - 23 mg/dL   Creatinine, Ser 0.76 0.61 - 1.24 mg/dL   Calcium 7.6 (L) 8.9 - 10.3 mg/dL   Total Protein 6.2 (L) 6.5 - 8.1 g/dL   Albumin 3.3 (L) 3.5 - 5.0 g/dL   AST 19 15 - 41 U/L   ALT 17 0 - 44 U/L   Alkaline Phosphatase 60 38 - 126 U/L   Total Bilirubin 1.0 0.3 - 1.2 mg/dL   GFR calc non Af Amer >60 >60 mL/min   GFR calc Af Amer >60 >60 mL/min   Anion gap 8 5 - 15    Comment: Performed at [facility], [place] [address] [place], [place] [address]  CBC     Status: Abnormal   Collection Time: 09/07/18  2:15 AM  Result Value Ref Range   WBC 9.7 4.0 - 10.5 K/uL   RBC 4.11 (L) 4.22 - 5.81 MIL/uL   Hemoglobin 11.4 (L) 13.0 - 17.0 g/dL   HCT 35.6 (L) 39.0 - 52.0 %   MCV 86.6 80.0 - 100.0 fL   MCH 27.7 26.0 - 34.0 pg   MCHC 32.0 30.0 - 36.0 g/dL   RDW 20.1 (H) 11.5 - 15.5 %   Platelets 264 150 - 400 K/uL   nRBC 0.0 0.0 - 0.2 %    Comment: Performed at [facility], [place] [address], [place], [place] [address]  Troponin I - Now Then Q6H     Status: None   Collection Time: 09/07/18  2:15 AM  Result Value Ref Range   Troponin I <0.03 <0.03 ng/mL    Comment: Performed at [facility], [place] [address] [place], [place] [address]  Lipase, blood     Status: None   Collection Time: 09/07/18  8:04 AM  Result Value Ref Range   Lipase 28 11 - 51 U/L    Comment: Performed at [facility], [place] [address] [place], [place] [address]  Troponin I - Now Then Q6H     Status: None   Collection Time: 09/07/18  8:09 AM  Result Value Ref Range   Troponin I <0.03 <0.03 ng/mL    Comment: Performed at [facility], [place] [address], [place], [place] [address]  Magnesium     Status: None   Collection Time: 09/07/18  8:09 AM  Result Value Ref Range   Magnesium 1.8 1.7 - 2.4 mg/dL    Comment: Performed at [facility], [place] [address], [place], [place] [address]  Glucose, capillary     Status: Abnormal   Collection Time: 09/07/18  1:30 PM  Result Value Ref Range   Glucose-Capillary 183 (H) 70 - 99 mg/dL    Current Facility-Administered Medications  Medication Dose Route Frequency Provider Last Rate Last Dose  . 0.9 %  sodium chloride infusion   Intravenous Continuous Eugenie Filler, MD 75 mL/hr at 09/07/18 1355    . alum & mag hydroxide-simeth (MAALOX/MYLANTA) 200-200-20 MG/5ML suspension 30 mL  30 mL Oral Q6H PRN Eugenie Filler, MD   30 mL at 09/07/18 1115   And  . lidocaine (XYLOCAINE) 2 % viscous mouth solution 15  mL  15 mL Oral Q6H PRN Eugenie Filler, MD      . budesonide (PULMICORT) nebulizer solution 0.5 mg  0.5 mg Nebulization BID Eugenie Filler, MD   0.5 mg at 09/07/18 1052  . chlordiazePOXIDE (LIBRIUM) capsule 25 mg  25 mg Oral Q6H PRN Eugenie Filler, MD      . chlordiazePOXIDE (LIBRIUM) capsule 25 mg  25 mg Oral QID Eugenie Filler, MD   25 mg at 09/07/18 1600   Followed by  . [START ON 09/08/2018] chlordiazePOXIDE (LIBRIUM) capsule 25 mg  25 mg Oral TID Eugenie Filler, MD       Followed by  . [START ON 09/09/2018] chlordiazePOXIDE (LIBRIUM) capsule 25 mg  25 mg Oral BH-qamhs Eugenie Filler, MD       Followed by  . [START ON 09/10/2018] chlordiazePOXIDE (LIBRIUM) capsule 25 mg  25 mg Oral Daily Eugenie Filler, MD      . citalopram (CELEXA) tablet 20 mg  20 mg Oral Daily Avianna Moynahan, MD      . diclofenac sodium (VOLTAREN) 1 %  transdermal gel 2 g  2 g Topical Daily Eugenie Filler, MD      . doxycycline (VIBRAMYCIN) 100 mg in sodium chloride 0.9 % 250 mL IVPB  100 mg Intravenous Q12H Eugenie Filler, MD 125 mL/hr at 09/07/18 1604 100 mg at 09/07/18 1604  . enoxaparin (LOVENOX) injection 40 mg  40 mg Subcutaneous Q24H Eugenie Filler, MD   40 mg at 09/06/18 2229  . fluticasone (FLONASE) 50 MCG/ACT nasal spray 2 spray  2 spray Each Nare Daily Eugenie Filler, MD   2 spray at 09/07/18 1357  . guaiFENesin (MUCINEX) 12 hr tablet 1,200 mg  1,200 mg Oral BID Eugenie Filler, MD   1,200 mg at 09/07/18 1121  . hydrOXYzine (ATARAX/VISTARIL) tablet 25 mg  25 mg Oral Q6H PRN Eugenie Filler, MD      . Derrill Memo ON 09/08/2018] Influenza vac split quadrivalent PF (FLUARIX) injection 0.5 mL  0.5 mL Intramuscular Tomorrow-1000 Eugenie Filler, MD      . ipratropium-albuterol (DUONEB) 0.5-2.5 (3) MG/3ML nebulizer solution 3 mL  3 mL Nebulization BID Eugenie Filler, MD   3 mL at 09/07/18 1047  . loperamide (IMODIUM) capsule 2-4 mg  2-4 mg Oral PRN Eugenie Filler, MD       . loratadine (CLARITIN) tablet 10 mg  10 mg Oral Daily Eugenie Filler, MD   10 mg at 09/07/18 1115  . LORazepam (ATIVAN) injection 2-3 mg  2-3 mg Intravenous Q1H PRN Eugenie Filler, MD   2 mg at 09/07/18 1553  . methylPREDNISolone sodium succinate (SOLU-MEDROL) 40 mg/mL injection 40 mg  40 mg Intravenous Q12H Eugenie Filler, MD   40 mg at 09/07/18 0920  . multivitamin with minerals tablet 1 tablet  1 tablet Oral Daily Eugenie Filler, MD   1 tablet at 09/07/18 1115  . nicotine (NICODERM CQ - dosed in mg/24 hours) patch 21 mg  21 mg Transdermal Daily Eugenie Filler, MD   21 mg at 09/07/18 1122  . ondansetron (ZOFRAN) tablet 4 mg  4 mg Oral Q6H PRN Eugenie Filler, MD       Or  . ondansetron [facility]) injection 4 mg  4 mg Intravenous Q6H PRN Eugenie Filler, MD      . pantoprazole (PROTONIX) EC tablet 40 mg  40 mg Oral BID AC Eugenie Filler, MD   40 mg at 09/07/18 1600  . propranolol (INDERAL) tablet 10 mg  10 mg Oral TID Eugenie Filler, MD   10 mg at 09/07/18 1553  . thiamine (VITAMIN B-1) tablet 100 mg  100 mg Oral Daily Eugenie Filler, MD   100 mg at 09/07/18 1115   Or  . thiamine (B-1) injection 100 mg  100 mg Intravenous Daily Eugenie Filler, MD   100 mg at 09/06/18 1444  . traZODone (DESYREL) tablet 100 mg  100 mg Oral QHS PRN Corena Pilgrim, MD        Musculoskeletal: Strength & Muscle Tone: tremulous Gait & Station: not assessed Patient leans: N/A  Psychiatric Specialty Exam: Physical Exam  Psychiatric: His speech is slurred. He is slowed. Cognition and memory are normal. He expresses impulsivity. He exhibits a depressed mood. He expresses suicidal ideation. He expresses suicidal plans.    Review of Systems  Constitutional: Negative.   HENT: Negative.   Eyes: Negative.   Respiratory: Positive for cough.   Cardiovascular: Negative.   Gastrointestinal: Negative.   Genitourinary: Negative.  Musculoskeletal: Negative.   Skin:  Negative.   Neurological: Positive for tremors.  Endo/Heme/Allergies: Negative.   Psychiatric/Behavioral: Positive for depression, substance abuse and suicidal ideas. Negative for hallucinations and memory loss. The patient is not nervous/anxious and does not have insomnia.     Blood pressure (!) 152/90, pulse 84, temperature 98.2 F (36.8 C), temperature source Oral, resp. rate (!) 21, height 5\' 5"  (1.651 m), weight 68.5 kg, SpO2 95 %.Body mass index is 25.13 kg/m.  General Appearance: Casual and Fairly Groomed  Eye Contact:  Fair  Speech:  Slow and Slurred  Volume:  Normal  Mood:  Depressed and Hopeless  Affect:  Depressed  Thought Process:  Coherent  Orientation:  Full (Time, Place, and Person)  Thought Content:  Negative  Suicidal Thoughts:  Yes.  with intent/plan  Homicidal Thoughts:  No  Memory:  Immediate;   Good Recent;   Good Remote;   Good  Judgement:  Poor  Insight:  poor  Psychomotor Activity:  Tremor  Concentration:  Concentration: Good and Attention Span: Good  Recall:  Good  Fund of Knowledge:  Fair  Language:  Fair  Akathisia:  tremor noted to right upper extremity  Handed:  Right  AIMS (if indicated):     Assets:  Desire for Improvement Financial Resources/Insurance  ADL's:  Intact  Cognition:  WNL  Sleep:   fair     Treatment Plan Summary:  Treatment Plan Summary: Recommendations: -Continue 1:1 sitter for safety -Continue alcohol Librium detox protocol. -Continue Celexa 20 mg for depression  and Trazodone 100 mg qhs prn for sleep. -Due to the inability to contract for safety and intentional overdose, recommend patient  be admitted to inpatient psychiatric facility once medically cleared  for further evaluation, treatment, and management.   Disposition: Recommend psychiatric Inpatient admission when medically cleared. Supportive therapy provided about ongoing stressors. Psychiatric service signing out, Re-consult as needed    Corena Pilgrim,  MD 09/07/2018 4:26 PM

## 2018-09-07 NOTE — Progress Notes (Signed)
Pt. getting increasingly confused, hallucinating and trying to get out of bed. Has been stating he wants to go home to his wife & son who are dead and join them. Pt. has been receiving prn Ativan per [facility] protocol. MD notified. Transfer orders to Tele floor cancelled per MD.

## 2018-09-07 NOTE — Progress Notes (Signed)
PROGRESS NOTE    Samuel Reyes  OEV:[id number] DOB: May 23, 1956 DOA: 09/06/2018 PCP: Patient, No Pcp Per   Brief Narrative:  Patient is 63 year old gentleman history of COPD, tobacco use, chronic alcohol use, coronary artery disease presented to the hospital with overdose.  Patient on admission noted to be mildly confused and stated he took a bunch of pills started drinking alcohol until he passed out.  Patient noted to be tremulous.  Patient noted to be upset that he was not successful with his suicidal attempt.  Patient also with complaints of shortness of breath, productive cough, upper abdominal pain.  Patient admitted to the stepdown unit, placed on Ativan withdrawal protocol, one-to-one sitter, psychiatry consultation pending, being treated for acute COPD exacerbation.   Assessment & Plan:   Principal Problem:   Overdose Active Problems:   Suicidal ideation   GERD (gastroesophageal reflux disease)   COPD exacerbation (HCC)   Alcohol use disorder, severe, dependence (HCC)   Tobacco abuse   MDD (major depressive disorder), recurrent severe, without psychosis ([place])  1 overdose in the setting of depression and suicidal intent Patient noted to have taken a bunch of pills and drank alcohol until he passed out with suicidal intent.  Patient states 2 of his children died with 1 dying 38 years ago at age 45-year-old from spina bifida.  States 6 years ago found his child age 59 dead in the bed and blue from asthma exacerbation.  States 3 years after his son died his wife died.  Patient still with suicidal intent.  Continue one-to-one sitter.  Psychiatry consultation pending.  2.  Alcohol abuse Patient notes to be drinking more than 40 beers per day.  Patient with some slight tremors.  Alcohol level was negative on presentation.  Continue the Ativan withdrawal protocol.  Follow.  3.  Acute COPD exacerbation Patient with a cough, wheezing on examination likely secondary to acute COPD  exacerbation.  Repeat chest x-ray as patient has been hydrated to rule out pneumonia.  Continue IV steroids, scheduled nebs, antibiotics.  Supportive care.  4.  Tobacco abuse Tobacco cessation.  Nicotine patch.  5.  Anxiety Continue propranolol given patient's tachycardia.  6.  Coronary artery disease Patient with complaints of chest pain which seems to be more upper abdominal pain/epigastric pain which he describes more as a burning sensation likely GI related.  Cardiac enzymes negative x3.  Placed on GI cocktail.  7.  Upper abdominal pain/epigastric pain/GERD Patient with complaints of upper abdominal pain/epigastric pain which she initially described as a chest pain.  Cardiac enzymes which were cycled were negative x3.  Patient describes pain as a burning sensation.  Increase Protonix to twice daily.  Placed on a GI cocktail as needed.  Check a lipase levels.  IV fluids.  Supportive care.   DVT prophylaxis: Lovenox Code Status: Full Family Communication: Updated patient.  No family at bedside. Disposition Plan: Transfer to telemetry.   Consultants:   None.  Procedures:   Chest x-ray 09/06/2018    Antimicrobials:  IV doxycycline 09/06/2018   Subjective: Patient sleeping however easily arousable.  Patient complaining of chest pain and when asked exactly where chest pain is points at his upper abdomen/epigastric region.  Patient describes the chest pain as a burning sensation.  States when he ate breakfast this morning it was burning all the way down.  Patient with some complaints of epigastric pain radiating to his back.  No nausea or emesis.  Patient still with suicidal ideations.  Patient states  1 of his children died at age 86 from spina bifida.  Patient states found his other child at age 9 blue and dead in bed from a asthma exacerbation and states his wife died 3 years later.  Patient also with some complaints of neck pain.  Objective: Vitals:   09/07/18 0500 09/07/18 0600  09/07/18 0900 09/07/18 1047  BP: (!) 149/78 (!) 154/92 (!) 152/104   Pulse: 85 80 83   Resp: 19 (!) 24 18   Temp:      TempSrc:      SpO2: 93% 97% 93% 94%  Weight:      Height:        Intake/Output Summary (Last 24 hours) at 09/07/2018 1102 Last data filed at 09/07/2018 0927 Gross per 24 hour  Intake 1806.77 ml  Output 950 ml  Net 856.77 ml   Filed Weights   09/06/18 1244  Weight: 68.5 kg    Examination:  General exam: Sleepy.  Easily arousable. Respiratory system: Minimal expiratory wheezing.  Some coarse diffuse breath sounds.  No crackles noted.  Normal respiratory effort.   Cardiovascular system: S1 & S2 heard, RRR. No JVD, murmurs, rubs, gallops or clicks. No pedal edema. Gastrointestinal system: Abdomen is nondistended, soft and tender to palpation in the epigastrium.  Positive bowel sounds.  No rebound.  No guarding. Central nervous system: Alert and oriented. No focal neurological deficits. Extremities: Symmetric 5 x 5 power. Skin: No rashes, lesions or ulcers Psychiatry: Judgement and insight appear fair. Mood & affect appropriate.     Data Reviewed: I have personally reviewed following labs and imaging studies  CBC: Recent Labs  Lab 09/06/18 1246 09/07/18 0215  WBC 19.1* 9.7  HGB 14.1 11.4*  HCT 43.2 35.6*  MCV 84.9 86.6  PLT 338 175   Basic Metabolic Panel: Recent Labs  Lab 09/06/18 1246 09/07/18 0215 09/07/18 0809  NA 134* 130*  --   K 4.0 3.6  --   CL 93* 98  --   CO2 27 24  --   GLUCOSE 124* 124*  --   BUN 10 10  --   CREATININE 0.78 0.76  --   CALCIUM 9.1 7.6*  --   MG  --   --  1.8   GFR: Estimated Creatinine Clearance: 82.2 mL/min (by C-G formula based on SCr of 0.76 mg/dL). Liver Function Tests: Recent Labs  Lab 09/06/18 1246 09/07/18 0215  AST 25 19  ALT 20 17  ALKPHOS 75 60  BILITOT 0.9 1.0  PROT 7.7 6.2*  ALBUMIN 4.1 3.3*   No results for input(s): LIPASE, AMYLASE in the last 168 hours. No results for input(s): AMMONIA  in the last 168 hours. Coagulation Profile: No results for input(s): INR, PROTIME in the last 168 hours. Cardiac Enzymes: Recent Labs  Lab 09/06/18 2028 09/07/18 0215 09/07/18 0809  TROPONINI <0.03 <0.03 <0.03   BNP (last 3 results) No results for input(s): PROBNP in the last 8760 hours. HbA1C: No results for input(s): HGBA1C in the last 72 hours. CBG: Recent Labs  Lab 09/06/18 1418  GLUCAP 96   Lipid Profile: No results for input(s): CHOL, HDL, LDLCALC, TRIG, CHOLHDL, LDLDIRECT in the last 72 hours. Thyroid Function Tests: No results for input(s): TSH, T4TOTAL, FREET4, T3FREE, THYROIDAB in the last 72 hours. Anemia Panel: No results for input(s): VITAMINB12, FOLATE, FERRITIN, TIBC, IRON, RETICCTPCT in the last 72 hours. Sepsis Labs: No results for input(s): PROCALCITON, LATICACIDVEN in the last 168 hours.  Recent Results (from  the past 240 hour(s))  MRSA PCR Screening     Status: None   Collection Time: 09/06/18  8:14 PM  Result Value Ref Range Status   MRSA by PCR NEGATIVE NEGATIVE Final    Comment:        The GeneXpert MRSA Assay (FDA approved for NASAL specimens only), is one component of a comprehensive MRSA colonization surveillance program. It is not intended to diagnose MRSA infection nor to guide or monitor treatment for MRSA infections. Performed at [facility], [place] [address], [place], [place] [address]          Radiology Studies: Dg Chest Port 1 View  Result Date: 09/06/2018 CLINICAL DATA:  Leukocytosis, fever. EXAM: PORTABLE CHEST 1 VIEW COMPARISON:  Radiographs May 21, 2018. FINDINGS: The heart size and mediastinal contours are within normal limits. Both lungs are clear. The visualized skeletal structures are unremarkable. IMPRESSION: No active disease. Electronically Signed   By: Marijo Conception, M.D.   On: 09/06/2018 16:53        Scheduled Meds: . budesonide (PULMICORT) nebulizer solution  0.5 mg Nebulization BID    . enoxaparin (LOVENOX) injection  40 mg Subcutaneous Q24H  . fluticasone  2 spray Each Nare Daily  . guaiFENesin  1,200 mg Oral BID  . [START ON 09/08/2018] Influenza vac split quadrivalent PF  0.5 mL Intramuscular Tomorrow-1000  . ipratropium-albuterol  3 mL Nebulization BID  . loratadine  10 mg Oral Daily  . methylPREDNISolone (SOLU-MEDROL) injection  40 mg Intravenous Q12H  . multivitamin with minerals  1 tablet Oral Daily  . nicotine  21 mg Transdermal Daily  . pantoprazole  40 mg Oral BID AC  . propranolol  10 mg Oral TID  . thiamine  100 mg Oral Daily   Or  . thiamine  100 mg Intravenous Daily   Continuous Infusions: . sodium chloride Stopped (09/07/18 0416)  . doxycycline (VIBRAMYCIN) IV 125 mL/hr at 09/07/18 0600  . magnesium sulfate 1 - 4 g bolus IVPB       LOS: 1 day    Time spent: 40 mins    Samuel Seal, MD [facility]  If 7PM-7AM, please contact night-coverage [email or web address] 09/07/2018, 11:02 AM

## 2018-09-08 LAB — COMPREHENSIVE METABOLIC PANEL
ALBUMIN: 3.1 g/dL — AB (ref 3.5–5.0)
ALT: 16 U/L (ref 0–44)
AST: 15 U/L (ref 15–41)
Alkaline Phosphatase: 58 U/L (ref 38–126)
Anion gap: 6 (ref 5–15)
BUN: 11 mg/dL (ref 8–23)
CHLORIDE: 103 mmol/L (ref 98–111)
CO2: 28 mmol/L (ref 22–32)
Calcium: 8.2 mg/dL — ABNORMAL LOW (ref 8.9–10.3)
Creatinine, Ser: 0.7 mg/dL (ref 0.61–1.24)
GFR calc Af Amer: >60 mL/min (ref >60)
GFR calc non Af Amer: >60 mL/min (ref >60)
Glucose, Bld: 105 mg/dL — ABNORMAL HIGH (ref 70–99)
Potassium: 3.9 mmol/L (ref 3.5–5.1)
Sodium: 137 mmol/L (ref 135–145)
Total Bilirubin: 0.7 mg/dL (ref 0.3–1.2)
Total Protein: 5.8 g/dL — ABNORMAL LOW (ref 6.5–8.1)

## 2018-09-08 LAB — CBC WITH DIFFERENTIAL/PLATELET
Abs Immature Granulocytes: 0.06 10*3/uL (ref 0.00–0.07)
BASOS PCT: 0 %
Basophils Absolute: 0 10*3/uL (ref 0.0–0.1)
Eosinophils Absolute: 0 10*3/uL (ref 0.0–0.5)
Eosinophils Relative: 0 %
HCT: 35.8 % — ABNORMAL LOW (ref 39.0–52.0)
Hemoglobin: 11.2 g/dL — ABNORMAL LOW (ref 13.0–17.0)
Immature Granulocytes: 1 %
Lymphocytes Relative: 9 %
Lymphs Abs: 0.8 10*3/uL (ref 0.7–4.0)
MCH: 28.2 pg (ref 26.0–34.0)
MCHC: 31.3 g/dL (ref 30.0–36.0)
MCV: 90.2 fL (ref 80.0–100.0)
Monocytes Absolute: 0.5 10*3/uL (ref 0.1–1.0)
Monocytes Relative: 6 %
NEUTROS PCT: 84 %
Neutro Abs: 7.8 10*3/uL — ABNORMAL HIGH (ref 1.7–7.7)
PLATELETS: 242 10*3/uL (ref 150–400)
RBC: 3.97 MIL/uL — AB (ref 4.22–5.81)
RDW: 19.9 % — ABNORMAL HIGH (ref 11.5–15.5)
WBC: 9.2 10*3/uL (ref 4.0–10.5)
nRBC: 0 % (ref 0.0–0.2)

## 2018-09-08 LAB — MAGNESIUM: Magnesium: 2.1 mg/dL (ref 1.7–2.4)

## 2018-09-08 MED ORDER — POTASSIUM CHLORIDE CRYS ER 20 MEQ PO TBCR
20.0000 meq | EXTENDED_RELEASE_TABLET | Freq: Once | ORAL | Status: AC
  Administered 2018-09-08: 20 meq via ORAL
  Filled 2018-09-08: qty 1

## 2018-09-08 MED ORDER — DOXYCYCLINE HYCLATE 100 MG PO TABS
100.0000 mg | ORAL_TABLET | Freq: Two times a day (BID) | ORAL | Status: DC
  Administered 2018-09-08 – 2018-09-11 (×6): 100 mg via ORAL
  Filled 2018-09-08 (×6): qty 1

## 2018-09-08 MED ORDER — SODIUM CHLORIDE 0.9 % IV BOLUS
500.0000 mL | Freq: Once | INTRAVENOUS | Status: AC
  Administered 2018-09-08: 500 mL via INTRAVENOUS

## 2018-09-08 MED ORDER — HYDRALAZINE HCL 20 MG/ML IJ SOLN: 5.0000 mg | Freq: Four times a day (QID) | INTRAMUSCULAR | Status: DC | PRN

## 2018-09-08 MED ORDER — PROPRANOLOL HCL 10 MG PO TABS
10.0000 mg | ORAL_TABLET | Freq: Three times a day (TID) | ORAL | Status: DC
  Filled 2018-09-08: qty 1

## 2018-09-08 NOTE — Progress Notes (Signed)
PROGRESS NOTE    Samuel Reyes  WUJ:[id number] DOB: 06/30/1956 DOA: 09/06/2018 PCP: Patient, No Pcp Per   Brief Narrative:  Patient is 63 year old gentleman history of COPD, tobacco use, chronic alcohol use, coronary artery disease presented to the hospital with overdose.  Patient on admission noted to be mildly confused and stated he took a bunch of pills started drinking alcohol until he passed out.  Patient noted to be tremulous.  Patient noted to be upset that he was not successful with his suicidal attempt.  Patient also with complaints of shortness of breath, productive cough, upper abdominal pain.  Patient admitted to the stepdown unit, placed on Ativan withdrawal protocol, one-to-one sitter, psychiatry consultation pending, being treated for acute COPD exacerbation.   Assessment & Plan:   Principal Problem:   Overdose Active Problems:   Suicidal ideation   GERD (gastroesophageal reflux disease)   COPD exacerbation (HCC)   Alcohol use disorder, severe, dependence (HCC)   Tobacco abuse   MDD (major depressive disorder), recurrent severe, without psychosis ([place])  1 overdose in the setting of depression and suicidal intent Patient noted to have taken a bunch of pills and drank alcohol until he passed out with suicidal intent.  Patient states 2 of his children died with 1 dying 7 years ago at age 71-year-old from spina bifida.  Stated 6 years ago found his child age 42 dead in the bed and blue from asthma exacerbation.  Stated 3 years after his son died his wife died.  Patient still with suicidal intent.  Continue one-to-one sitter.  Psychiatry has assessed the patient and recommending to continue one-to-one sitter for safety, continue Librium and Ativan protocol, Celexa 20 mg for depression and trazodone 100 mg nightly as needed for sleep.  Per psychiatry patient needs to be admitted to inpatient psychiatric facility once medically stable.   2.  Alcohol abuse with alcohol  withdrawal Patient notes to be drinking more than 40 beers per day.  Patient noted to go into DTs overnight per RN requiring several bouts of IV Ativan.  Patient currently in the deep sleep.  Continue Librium detox protocol.  Continue Ativan withdrawal protocol, thiamine, folic acid, multivitamin.  Follow.   3.  Acute COPD exacerbation Patient with a cough, wheezing on examination likely secondary to acute COPD exacerbation.  Repeat chest x-ray negative for any acute cardiopulmonary infiltrate.  Continue IV steroids, scheduled nebs.  Change IV doxycycline to oral doxycycline.  Supportive care.   4.  Tobacco abuse Tobacco cessation.  Nicotine patch.  5.  Anxiety Continue propranolol given patient's tachycardia.  6.  Coronary artery disease Patient with complaints of chest pain which seems to be more upper abdominal pain/epigastric pain which he describes more as a burning sensation likely GI related.  Cardiac enzymes negative x3.  GI cocktail as needed.  7.  Upper abdominal pain/epigastric pain/GERD Patient with complaints of upper abdominal pain/epigastric pain which he initially described as a chest pain on 09/07/2018.  Cardiac enzymes which were cycled were negative x3.  Patient described pain as a burning sensation.  Lipase levels within normal limits.  Continue Protonix twice daily.  GI cocktail as needed.  Saline lock IV fluids.  Supportive care.   DVT prophylaxis: Lovenox Code Status: Full Family Communication: No family at bedside. Disposition Plan: Inpatient psychiatry once medically stable.    Consultants:   Psychiatry: Dr. Darleene Cleaver 09/07/2018  Procedures:   Chest x-ray 09/06/2018, 09/07/2018    Antimicrobials:  IV doxycycline 09/06/2018>>> oral doxycycline 09/08/2018  Subjective: Patient in a deep sleep.  Per RN patient with full-blown DTs overnight requiring several rounds of Ativan as needed.  Patient currently deeply asleep.    Objective: Vitals:   09/08/18 0300  09/08/18 0400 09/08/18 0500 09/08/18 0815  BP: 120/64 135/76 (!) 142/68   Pulse: 70 73 65   Resp: 18 20 17    Temp:  (!) 97.2 F (36.2 C)  98.3 F (36.8 C)  TempSrc:  Oral  Oral  SpO2: 99% 100% 100%   Weight:      Height:        Intake/Output Summary (Last 24 hours) at 09/08/2018 0938 Last data filed at 09/08/2018 0100 Gross per 24 hour  Intake 719.21 ml  Output 2150 ml  Net -1430.79 ml   Filed Weights   09/06/18 1244  Weight: 68.5 kg    Examination:  General exam: Asleep. Respiratory system: Minimal expiratory wheezing.  No rhonchi.  No crackles anterior lung base.  Cardiovascular system: RRR no murmurs rubs or gallops.  No JVD.  No lower extremity edema.  Gastrointestinal system: Abdomen is soft, nontender, nondistended, positive bowel sounds.  No rebound.  No guarding. Central nervous system: Asleep.  No focal neurological deficits. Extremities: Symmetric 5 x 5 power. Skin: No rashes, lesions or ulcers Psychiatry: Judgement and insight unable to be assessed at this time as patient in a deep sleep.  Mood & affect appropriate.     Data Reviewed: I have personally reviewed following labs and imaging studies  CBC: Recent Labs  Lab 09/06/18 1246 09/07/18 0215 09/08/18 0712  WBC 19.1* 9.7 9.2  NEUTROABS  --   --  7.8*  HGB 14.1 11.4* 11.2*  HCT 43.2 35.6* 35.8*  MCV 84.9 86.6 90.2  PLT 338 264 387   Basic Metabolic Panel: Recent Labs  Lab 09/06/18 1246 09/07/18 0215 09/07/18 0809 09/08/18 0712  NA 134* 130*  --  137  K 4.0 3.6  --  3.9  CL 93* 98  --  103  CO2 27 24  --  28  GLUCOSE 124* 124*  --  105*  BUN 10 10  --  11  CREATININE 0.78 0.76  --  0.70  CALCIUM 9.1 7.6*  --  8.2*  MG  --   --  1.8 2.1   GFR: Estimated Creatinine Clearance: 82.2 mL/min (by C-G formula based on SCr of 0.7 mg/dL). Liver Function Tests: Recent Labs  Lab 09/06/18 1246 09/07/18 0215 09/08/18 0712  AST 25 19 15   ALT 20 17 16   ALKPHOS 75 60 58  BILITOT 0.9 1.0 0.7   PROT 7.7 6.2* 5.8*  ALBUMIN 4.1 3.3* 3.1*   Recent Labs  Lab 09/07/18 0804  LIPASE 28   No results for input(s): AMMONIA in the last 168 hours. Coagulation Profile: No results for input(s): INR, PROTIME in the last 168 hours. Cardiac Enzymes: Recent Labs  Lab 09/06/18 2028 09/07/18 0215 09/07/18 0809  TROPONINI <0.03 <0.03 <0.03   BNP (last 3 results) No results for input(s): PROBNP in the last 8760 hours. HbA1C: No results for input(s): HGBA1C in the last 72 hours. CBG: Recent Labs  Lab 09/06/18 1418 09/07/18 1330  GLUCAP 96 183*   Lipid Profile: No results for input(s): CHOL, HDL, LDLCALC, TRIG, CHOLHDL, LDLDIRECT in the last 72 hours. Thyroid Function Tests: No results for input(s): TSH, T4TOTAL, FREET4, T3FREE, THYROIDAB in the last 72 hours. Anemia Panel: No results for input(s): VITAMINB12, FOLATE, FERRITIN, TIBC, IRON, RETICCTPCT in the last 72  hours. Sepsis Labs: No results for input(s): PROCALCITON, LATICACIDVEN in the last 168 hours.  Recent Results (from the past 240 hour(s))  MRSA PCR Screening     Status: None   Collection Time: 09/06/18  8:14 PM  Result Value Ref Range Status   MRSA by PCR NEGATIVE NEGATIVE Final    Comment:        The GeneXpert MRSA Assay (FDA approved for NASAL specimens only), is one component of a comprehensive MRSA colonization surveillance program. It is not intended to diagnose MRSA infection nor to guide or monitor treatment for MRSA infections. Performed at [facility], [place] [address], [place], [place] [address]          Radiology Studies: Dg Chest Port 1 View  Result Date: 09/07/2018 CLINICAL DATA:  Patient with overdose. EXAM: PORTABLE CHEST 1 VIEW COMPARISON:  Chest radiograph 09/06/2018 FINDINGS: Monitoring leads overlie the patient. Normal cardiac and mediastinal contours. No consolidative pulmonary opacities. No pleural effusion or pneumothorax. IMPRESSION: No active disease.  Electronically Signed   By: Lovey Newcomer M.D.   On: 09/07/2018 14:00   Dg Chest Port 1 View  Result Date: 09/06/2018 CLINICAL DATA:  Leukocytosis, fever. EXAM: PORTABLE CHEST 1 VIEW COMPARISON:  Radiographs May 21, 2018. FINDINGS: The heart size and mediastinal contours are within normal limits. Both lungs are clear. The visualized skeletal structures are unremarkable. IMPRESSION: No active disease. Electronically Signed   By: Marijo Conception, M.D.   On: 09/06/2018 16:53        Scheduled Meds: . budesonide (PULMICORT) nebulizer solution  0.5 mg Nebulization BID  . chlordiazePOXIDE  25 mg Oral QID   Followed by  . chlordiazePOXIDE  25 mg Oral TID   Followed by  . [START ON 09/09/2018] chlordiazePOXIDE  25 mg Oral BH-qamhs   Followed by  . [START ON 09/10/2018] chlordiazePOXIDE  25 mg Oral Daily  . citalopram  20 mg Oral Daily  . diclofenac sodium  2 g Topical Daily  . enoxaparin (LOVENOX) injection  40 mg Subcutaneous Q24H  . fluticasone  2 spray Each Nare Daily  . guaiFENesin  1,200 mg Oral BID  . Influenza vac split quadrivalent PF  0.5 mL Intramuscular Tomorrow-1000  . ipratropium-albuterol  3 mL Nebulization BID  . loratadine  10 mg Oral Daily  . methylPREDNISolone (SOLU-MEDROL) injection  40 mg Intravenous Q12H  . multivitamin with minerals  1 tablet Oral Daily  . nicotine  21 mg Transdermal Daily  . pantoprazole  40 mg Oral BID AC  . propranolol  10 mg Oral TID  . thiamine  100 mg Oral Daily   Or  . thiamine  100 mg Intravenous Daily   Continuous Infusions: . doxycycline (VIBRAMYCIN) IV 100 mg (09/08/18 0455)     LOS: 2 days    Time spent: 35 mins    Irine Seal, MD [facility]  If 7PM-7AM, please contact night-coverage [email or web address] 09/08/2018, 9:38 AM

## 2018-09-08 NOTE — Progress Notes (Signed)
LCSW consulted for inpatient psych placement.   Patient is not medically stable to transfer. Patient is not oriented.   LCSW will continue to follow for dc needs.   Carolin Coy [place]

## 2018-09-08 NOTE — Care Management Note (Signed)
Case Management Note  Patient Details  Name: Shean Gerding MRN: [id number] Date of Birth: November 12, 1955  Subjective/Objective:                  Discharge Readiness Return to top of Delirium RRG - [place]  Discharge readiness is indicated by patient meeting Recovery Milestones, including ALL of the following: ? Delirium resolved or manageable[R] NO ? No dangerous behavior[S] for at least 24 hours YES ? Thoughts of suicide or Harm absent or manageable at lower level of care YES ? Medical comorbidities, adverse medication events, and substance use absent or treatable at lower level of care COPD ? Functional status impairment absent or adequate self-care support planned at lower level of care ? Patient can participate (eg, verify absence of plan for harm) in needed monitoring ?  not at this time ? Provider and supports sufficiently available at lower level of care ? no ? Supports understand follow-up treatment and crisis plan ? no   Action/Plan: Will follow for progression of care and clinical status. Will follow for case management needs none present at this time.  Expected Discharge Date:                  Expected Discharge Plan:     In-House Referral:     Discharge planning Services     Post Acute Care Choice:    Choice offered to:     DME Arranged:    DME Agency:     HH Arranged:    HH Agency:     Status of Service:     If discussed at [employer], dates discussed:    Additional Comments:  Leeroy Cha, RN 09/08/2018, 11:24 AM

## 2018-09-09 DIAGNOSIS — R9431 Abnormal electrocardiogram [ECG] [EKG]: Secondary | ICD-10-CM

## 2018-09-09 LAB — CBC
HCT: 37.4 % — ABNORMAL LOW (ref 39.0–52.0)
Hemoglobin: 11.5 g/dL — ABNORMAL LOW (ref 13.0–17.0)
MCH: 27.3 pg (ref 26.0–34.0)
MCHC: 30.7 g/dL (ref 30.0–36.0)
MCV: 88.6 fL (ref 80.0–100.0)
NRBC: 0 % (ref 0.0–0.2)
Platelets: 257 10*3/uL (ref 150–400)
RBC: 4.22 MIL/uL (ref 4.22–5.81)
RDW: 20.1 % — ABNORMAL HIGH (ref 11.5–15.5)
WBC: 12 10*3/uL — ABNORMAL HIGH (ref 4.0–10.5)

## 2018-09-09 LAB — BASIC METABOLIC PANEL
Anion gap: 8 (ref 5–15)
BUN: 22 mg/dL (ref 8–23)
CO2: 24 mmol/L (ref 22–32)
Calcium: 8.1 mg/dL — ABNORMAL LOW (ref 8.9–10.3)
Chloride: 105 mmol/L (ref 98–111)
Creatinine, Ser: 0.81 mg/dL (ref 0.61–1.24)
GFR calc Af Amer: >60 mL/min (ref >60)
GFR calc non Af Amer: >60 mL/min (ref >60)
Glucose, Bld: 123 mg/dL — ABNORMAL HIGH (ref 70–99)
Potassium: 4.1 mmol/L (ref 3.5–5.1)
Sodium: 137 mmol/L (ref 135–145)

## 2018-09-09 MED ORDER — ALUM & MAG HYDROXIDE-SIMETH 200-200-20 MG/5ML PO SUSP
30.0000 mL | Freq: Four times a day (QID) | ORAL | Status: DC | PRN
  Administered 2018-09-09: 30 mL via ORAL
  Filled 2018-09-09: qty 30

## 2018-09-09 MED ORDER — PROPRANOLOL HCL 10 MG PO TABS
5.0000 mg | ORAL_TABLET | Freq: Three times a day (TID) | ORAL | Status: DC
  Administered 2018-09-09 – 2018-09-11 (×6): 5 mg via ORAL
  Filled 2018-09-09 (×2): qty 1
  Filled 2018-09-09 (×2): qty 0.5
  Filled 2018-09-09 (×3): qty 1

## 2018-09-09 MED ORDER — PREDNISONE 20 MG PO TABS
40.0000 mg | ORAL_TABLET | Freq: Every day | ORAL | Status: DC
  Administered 2018-09-10 – 2018-09-11 (×2): 40 mg via ORAL
  Filled 2018-09-09 (×2): qty 2

## 2018-09-09 MED ORDER — LIDOCAINE VISCOUS HCL 2 % MT SOLN
15.0000 mL | Freq: Four times a day (QID) | OROMUCOSAL | Status: DC | PRN
  Administered 2018-09-09: 15 mL via ORAL
  Filled 2018-09-09: qty 15

## 2018-09-09 NOTE — Progress Notes (Addendum)
PROGRESS NOTE    Christophr Calix  VCB:[id number] DOB: 03/08/1956 DOA: 09/06/2018 PCP: Patient, No Pcp Per   Brief Narrative:  Patient is 63 year old gentleman history of COPD, tobacco use, chronic alcohol use, coronary artery disease presented to the hospital with overdose.  Patient on admission noted to be mildly confused and stated he took a bunch of pills started drinking alcohol until he passed out.  Patient noted to be tremulous.  Patient noted to be upset that he was not successful with his suicidal attempt.  Patient also with complaints of shortness of breath, productive cough, upper abdominal pain.  Patient admitted to the stepdown unit, placed on Ativan withdrawal protocol, one-to-one sitter, psychiatry consultation pending, being treated for acute COPD exacerbation.   Assessment & Plan:   Principal Problem:   Overdose Active Problems:   Suicidal ideation   GERD (gastroesophageal reflux disease)   COPD exacerbation (HCC)   Alcohol use disorder, severe, dependence (HCC)   Tobacco abuse   MDD (major depressive disorder), recurrent severe, without psychosis ([place])   QT prolongation  1 overdose in the setting of depression and suicidal intent Patient noted to have taken a bunch of pills and drank alcohol until he passed out with suicidal intent.  Patient states 2 of his children died with 1 dying 7 years ago at age 55-year-old from spina bifida.  Stated 6 years ago found his child age 27 dead in the bed and blue from asthma exacerbation.  Stated 3 years after his son died his wife died.  Patient still with suicidal intent.  Continue one-to-one sitter.  Psychiatry has assessed the patient and recommending to continue one-to-one sitter for safety, continue Librium and Ativan protocol, trazodone 100 mg nightly as needed for sleep.  Patient Celexa was discontinued yesterday secondary to QTC prolongation.  Repeat EKG this morning with resolution of QTC prolongation.  Likely resume Celexa  tomorrow for patient's depression.  Per psychiatry patient needs to be admitted to inpatient psychiatric facility once medically stable.   2.  Alcohol abuse with alcohol withdrawal Patient notes to be drinking more than 40 beers per day.  Patient noted to go into DTs the night of 09/07/2018, per RN requiring several bouts of IV Ativan.  DTs improving.  Continue Librium detox protocol.  Continue IV Ativan as needed.  Continue thiamine, folic acid, multivitamin.  Follow.   3.  Acute COPD exacerbation Patient with a cough, wheezing on examination likely secondary to acute COPD exacerbation.  Repeat chest x-ray negative for any acute cardiopulmonary infiltrate.  Change IV steroids to oral prednisone.  Continue scheduled nebs.  Continue oral doxycycline for total of 5 days.  Supportive care.   4.  Tobacco abuse Tobacco cessation.  Nicotine patch.  5.  Anxiety Propranolol discontinued yesterday due to borderline blood pressure.  Blood pressure improved.  Resume propranolol at 5 mg p.o. 3 times daily.  6.  Coronary artery disease Patient with complaints of chest pain which seems to be more upper abdominal pain/epigastric pain which he describes more as a burning sensation likely GI related.  Cardiac enzymes negative x3.  GI cocktail as needed.  Resume patient's propranolol at 5 mg p.o. 3 times daily.  7.  Upper abdominal pain/epigastric pain/GERD Patient with complaints of upper abdominal pain/epigastric pain which he initially described as a chest pain on 09/07/2018.  Cardiac enzymes which were cycled were negative x3.  Patient described pain as a burning sensation.  Lipase levels within normal limits.  Continue Protonix twice daily.  GI cocktail as needed.  Saline lock IV fluids.  Supportive care.  8.  QTC prolongation Repeat EKG this morning with resolution of QTC prolongation.  Keep potassium greater than 4.  Keep magnesium greater than 2.  Patient Celexa was discontinued yesterday however will resume  tomorrow.  Will need to monitor QTC interval.   DVT prophylaxis: Lovenox Code Status: Full Family Communication: No family at bedside. Disposition Plan: Transfer to telemetry.  Inpatient psychiatry once medically stable.    Consultants:   Psychiatry: Dr. Darleene Cleaver 09/07/2018  Procedures:   Chest x-ray 09/06/2018, 09/07/2018    Antimicrobials:  IV doxycycline 09/06/2018>>> oral doxycycline 09/08/2018   Subjective: Patient sitting up eating breakfast.  Complaining of significant heartburn.  Denies any shortness of breath.  DTs improved per RN overnight and this morning.  Sitter at bedside.  Objective: Vitals:   09/09/18 0200 09/09/18 0400 09/09/18 0600 09/09/18 0700  BP: 112/61 (!) 101/57 128/67 111/63  Pulse: 67 65 61 69  Resp: (!) 21 (!) 22 19 19   Temp:      TempSrc:      SpO2: 92% 96% 96% 91%  Weight:      Height:        Intake/Output Summary (Last 24 hours) at 09/09/2018 0932 Last data filed at 09/09/2018 0000 Gross per 24 hour  Intake 937.81 ml  Output 750 ml  Net 187.81 ml   Filed Weights   09/06/18 1244  Weight: 68.5 kg    Examination:  General exam: Alert Respiratory system: Lungs clear to auscultation bilaterally.  No wheezes, no crackles, no rhonchi.  Cardiovascular system: Regular rate and rhythm no murmurs rubs or gallops.  No JVD.  No lower extremity edema. Gastrointestinal system: Abdomen is nontender, nondistended, soft, positive bowel sounds.  No rebound.  No guarding.   Central nervous system: No focal neurological deficits.  Extremities: Symmetric 5 x 5 power. Skin: No rashes, lesions or ulcers Psychiatry: Judgement and insight poor to fair. Mood & affect appropriate.     Data Reviewed: I have personally reviewed following labs and imaging studies  CBC: Recent Labs  Lab 09/06/18 1246 09/07/18 0215 09/08/18 0712 09/09/18 0253  WBC 19.1* 9.7 9.2 12.0*  NEUTROABS  --   --  7.8*  --   HGB 14.1 11.4* 11.2* 11.5*  HCT 43.2 35.6* 35.8* 37.4*    MCV 84.9 86.6 90.2 88.6  PLT 338 264 242 782   Basic Metabolic Panel: Recent Labs  Lab 09/06/18 1246 09/07/18 0215 09/07/18 0809 09/08/18 0712 09/09/18 0253  NA 134* 130*  --  137 137  K 4.0 3.6  --  3.9 4.1  CL 93* 98  --  103 105  CO2 27 24  --  28 24  GLUCOSE 124* 124*  --  105* 123*  BUN 10 10  --  11 22  CREATININE 0.78 0.76  --  0.70 0.81  CALCIUM 9.1 7.6*  --  8.2* 8.1*  MG  --   --  1.8 2.1  --    GFR: Estimated Creatinine Clearance: 81.2 mL/min (by C-G formula based on SCr of 0.81 mg/dL). Liver Function Tests: Recent Labs  Lab 09/06/18 1246 09/07/18 0215 09/08/18 0712  AST 25 19 15   ALT 20 17 16   ALKPHOS 75 60 58  BILITOT 0.9 1.0 0.7  PROT 7.7 6.2* 5.8*  ALBUMIN 4.1 3.3* 3.1*   Recent Labs  Lab 09/07/18 0804  LIPASE 28   No results for input(s): AMMONIA in the last  168 hours. Coagulation Profile: No results for input(s): INR, PROTIME in the last 168 hours. Cardiac Enzymes: Recent Labs  Lab 09/06/18 2028 09/07/18 0215 09/07/18 0809  TROPONINI <0.03 <0.03 <0.03   BNP (last 3 results) No results for input(s): PROBNP in the last 8760 hours. HbA1C: No results for input(s): HGBA1C in the last 72 hours. CBG: Recent Labs  Lab 09/06/18 1418 09/07/18 1330  GLUCAP 96 183*   Lipid Profile: No results for input(s): CHOL, HDL, LDLCALC, TRIG, CHOLHDL, LDLDIRECT in the last 72 hours. Thyroid Function Tests: No results for input(s): TSH, T4TOTAL, FREET4, T3FREE, THYROIDAB in the last 72 hours. Anemia Panel: No results for input(s): VITAMINB12, FOLATE, FERRITIN, TIBC, IRON, RETICCTPCT in the last 72 hours. Sepsis Labs: No results for input(s): PROCALCITON, LATICACIDVEN in the last 168 hours.  Recent Results (from the past 240 hour(s))  MRSA PCR Screening     Status: None   Collection Time: 09/06/18  8:14 PM  Result Value Ref Range Status   MRSA by PCR NEGATIVE NEGATIVE Final    Comment:        The GeneXpert MRSA Assay (FDA approved for NASAL  specimens only), is one component of a comprehensive MRSA colonization surveillance program. It is not intended to diagnose MRSA infection nor to guide or monitor treatment for MRSA infections. Performed at [facility], [place] [address], [place], [place] [address]          Radiology Studies: Dg Chest Port 1 View  Result Date: 09/07/2018 CLINICAL DATA:  Patient with overdose. EXAM: PORTABLE CHEST 1 VIEW COMPARISON:  Chest radiograph 09/06/2018 FINDINGS: Monitoring leads overlie the patient. Normal cardiac and mediastinal contours. No consolidative pulmonary opacities. No pleural effusion or pneumothorax. IMPRESSION: No active disease. Electronically Signed   By: Lovey Newcomer M.D.   On: 09/07/2018 14:00        Scheduled Meds: . budesonide (PULMICORT) nebulizer solution  0.5 mg Nebulization BID  . chlordiazePOXIDE  25 mg Oral TID   Followed by  . chlordiazePOXIDE  25 mg Oral BH-qamhs   Followed by  . [START ON 09/10/2018] chlordiazePOXIDE  25 mg Oral Daily  . diclofenac sodium  2 g Topical Daily  . doxycycline  100 mg Oral Q12H  . enoxaparin (LOVENOX) injection  40 mg Subcutaneous Q24H  . fluticasone  2 spray Each Nare Daily  . guaiFENesin  1,200 mg Oral BID  . Influenza vac split quadrivalent PF  0.5 mL Intramuscular Tomorrow-1000  . ipratropium-albuterol  3 mL Nebulization BID  . loratadine  10 mg Oral Daily  . methylPREDNISolone (SOLU-MEDROL) injection  40 mg Intravenous Q12H  . multivitamin with minerals  1 tablet Oral Daily  . nicotine  21 mg Transdermal Daily  . pantoprazole  40 mg Oral BID AC  . thiamine  100 mg Oral Daily   Or  . thiamine  100 mg Intravenous Daily   Continuous Infusions:    LOS: 3 days    Time spent: 35 mins    Irine Seal, MD [facility]  If 7PM-7AM, please contact night-coverage [email or web address] 09/09/2018, 9:32 AM

## 2018-09-10 DIAGNOSIS — R05 Cough: Secondary | ICD-10-CM

## 2018-09-10 LAB — BASIC METABOLIC PANEL
Anion gap: 8 (ref 5–15)
BUN: 24 mg/dL — ABNORMAL HIGH (ref 8–23)
CO2: 26 mmol/L (ref 22–32)
CREATININE: 0.76 mg/dL (ref 0.61–1.24)
Calcium: 8.1 mg/dL — ABNORMAL LOW (ref 8.9–10.3)
Chloride: 103 mmol/L (ref 98–111)
GFR calc Af Amer: >60 mL/min (ref >60)
GFR calc non Af Amer: >60 mL/min (ref >60)
Glucose, Bld: 84 mg/dL (ref 70–99)
Potassium: 3.7 mmol/L (ref 3.5–5.1)
SODIUM: 137 mmol/L (ref 135–145)

## 2018-09-10 LAB — CBC
HCT: 37.7 % — ABNORMAL LOW (ref 39.0–52.0)
Hemoglobin: 11.7 g/dL — ABNORMAL LOW (ref 13.0–17.0)
MCH: 27.9 pg (ref 26.0–34.0)
MCHC: 31 g/dL (ref 30.0–36.0)
MCV: 89.8 fL (ref 80.0–100.0)
PLATELETS: 226 10*3/uL (ref 150–400)
RBC: 4.2 MIL/uL — ABNORMAL LOW (ref 4.22–5.81)
RDW: 20.1 % — ABNORMAL HIGH (ref 11.5–15.5)
WBC: 8.9 10*3/uL (ref 4.0–10.5)
nRBC: 0 % (ref 0.0–0.2)

## 2018-09-10 LAB — MAGNESIUM: MAGNESIUM: 1.9 mg/dL (ref 1.7–2.4)

## 2018-09-10 MED ORDER — CITALOPRAM HYDROBROMIDE 20 MG PO TABS
20.0000 mg | ORAL_TABLET | Freq: Every day | ORAL | Status: DC
  Administered 2018-09-10 – 2018-09-11 (×2): 20 mg via ORAL
  Filled 2018-09-10 (×2): qty 1

## 2018-09-10 MED ORDER — OXYCODONE-ACETAMINOPHEN 5-325 MG PO TABS: 1.0000 | ORAL_TABLET | Freq: Four times a day (QID) | ORAL | Status: DC | PRN

## 2018-09-10 MED ORDER — ACETAMINOPHEN 325 MG PO TABS
650.0000 mg | ORAL_TABLET | ORAL | Status: DC | PRN
  Administered 2018-09-10 – 2018-09-11 (×3): 650 mg via ORAL
  Filled 2018-09-10 (×3): qty 2

## 2018-09-10 NOTE — Progress Notes (Signed)
LCSW following for inpatient psych.   LCSW faxed patient out to psych facilities.   Samuel Reyes [place]
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
## 2018-09-10 NOTE — Progress Notes (Signed)
[facility]                                                                              Patient Demographics  Samuel Reyes, is a 63 y.o. male, DOB - [birth year], IPJ:[id number]  Admit date - 09/06/2018   Admitting Physician Samuel Karlyne Greenspan, MD  Outpatient Primary MD for the patient is Patient, No Pcp Per  Outpatient specialists:   LOS - 4  days   Medical records reviewed and are as summarized below:    Chief Complaint  Patient presents with  . Drug Overdose  . Suicidal       Brief summary   Patient is 63 year old gentleman history of COPD, tobacco use, chronic alcohol use, coronary artery disease presented to the hospital with overdose.  Patient on admission noted to be mildly confused and stated he took a bunch of pills started drinking alcohol until he passed out.  Patient noted to be tremulous.  Patient noted to be upset that he was not successful with his suicidal attempt.  Patient also with complaints of shortness of breath, productive cough, upper abdominal pain.  Patient was admitted to the stepdown unit, placed on Ativan withdrawal protocol, one-to-one sitter, and treated for acute COPD exacerbation.   Assessment & Plan    Principal Problem:   Overdose in the setting of depression, suicidal attempt -Patient apparently took a bunch of pills and drank alcohol until he passed out with suicidal attempt. -Patient reported 2 of his children has died, 3 years after his son died, his wife also passed -Continue one-to-one sitter, psych consult was obtained -Per recommendations, patient was placed on Librium and Ativan protocol, trazodone nightly as needed for sleep.  Celexa was held due to prolonged QTC which has improved, will restart Celexa -Inpatient psych facility pending, medically now clear  . Active Problems: Alcohol abuse with alcohol withdrawals -Patient reported to be drinking more than 40 beers a day, went into DT requiring several bouts of IV  Ativan -Currently medically stable, continue thiamine, folic acid, MVI -Continue CIWA with Librium taper protocol and Ativan, CIWA 1-4  Acute COPD exacerbation -Currently much improved, chest x-ray negative for acute infiltrates -Continue doxycycline, scheduled nebs, steroids    GERD (gastroesophageal reflux disease) -Continue PPI twice daily, currently no abdominal pain -Tolerating diet without any difficulty  Tobacco abuse Recommended tobacco cessation, continue nicotine patch  Anxiety -Continue propranolol given patient's tachycardia  CAD Cardiac enzymes negative x3, no acute issues   Code Status: Full code DVT Prophylaxis:  Lovenox  Family Communication: Discussed in detail with the patient, all imaging results, lab results explained to the patient   Disposition Plan: Medically clear for inpatient psych facility  Time Spent in minutes   35 minutes  Procedures:  None  Consultants:   Psychiatry  Antimicrobials:   Anti-infectives (From admission, onward)   Start     Dose/Rate Route Frequency Ordered Stop   09/08/18 1800  doxycycline (VIBRA-TABS) tablet 100 mg     100 mg Oral Every 12 hours 09/08/18 1017     09/06/18 1700  doxycycline (VIBRAMYCIN) 100 mg in sodium chloride 0.9 % 250 mL  IVPB  Status:  Discontinued     100 mg 125 mL/hr over 120 Minutes Intravenous Every 12 hours 09/06/18 1628 09/06/18 1630   09/06/18 1645  doxycycline (VIBRAMYCIN) 100 mg in sodium chloride 0.9 % 250 mL IVPB  Status:  Discontinued     100 mg 125 mL/hr over 120 Minutes Intravenous Every 12 hours 09/06/18 1634 09/08/18 1017          Medications  Scheduled Meds: . budesonide (PULMICORT) nebulizer solution  0.5 mg Nebulization BID  . chlordiazePOXIDE  25 mg Oral Daily  . diclofenac sodium  2 g Topical Daily  . doxycycline  100 mg Oral Q12H  . enoxaparin (LOVENOX) injection  40 mg Subcutaneous Q24H  . fluticasone  2 spray Each Nare Daily  . guaiFENesin  1,200 mg Oral BID  .  Influenza vac split quadrivalent PF  0.5 mL Intramuscular Tomorrow-1000  . ipratropium-albuterol  3 mL Nebulization BID  . loratadine  10 mg Oral Daily  . multivitamin with minerals  1 tablet Oral Daily  . nicotine  21 mg Transdermal Daily  . pantoprazole  40 mg Oral BID AC  . predniSONE  40 mg Oral QAC breakfast  . propranolol  5 mg Oral TID  . thiamine  100 mg Oral Daily   Or  . thiamine  100 mg Intravenous Daily   Continuous Infusions: PRN Meds:.alum & mag hydroxide-simeth **AND** lidocaine, chlordiazePOXIDE, hydrALAZINE, hydrOXYzine, loperamide, LORazepam, ondansetron **OR** ondansetron (ZOFRAN) IV, traZODone      Subjective:   Samuel Reyes was seen and examined today.  Alert and oriented, eating breakfast without any difficulty, no acute issues overnight. Patient denies dizziness, chest pain, shortness of breath, abdominal pain, N/V/D/C, new weakness, numbess, tingling.  No fevers Wheezing better  Objective:   Vitals:   09/09/18 2021 09/09/18 2027 09/10/18 0619 09/10/18 0859  BP: 106/60  130/74   Pulse: 86  66   Resp: 17  16   Temp: 97.8 F (36.6 C)  98.3 F (36.8 C)   TempSrc: Oral  Oral   SpO2: 97% 98% 95% 94%  Weight:      Height:        Intake/Output Summary (Last 24 hours) at 09/10/2018 1420 Last data filed at 09/10/2018 0500 Gross per 24 hour  Intake 360 ml  Output 100 ml  Net 260 ml     Wt Readings from Last 3 Encounters:  09/06/18 68.5 kg  07/17/18 66 kg  05/24/18 65.8 kg     Exam  General: Alert and oriented x 3, NAD  Eyes:   HEENT:  Atraumatic, normocephalic  Cardiovascular: S1 S2 auscultated,  Regular rate and rhythm.  Respiratory: Creased breath sound at the bases otherwise fairly clear, no rhonchi  Gastrointestinal: Soft, nontender, nondistended, + bowel sounds  Ext: no pedal edema bilaterally  Neuro: No new deficits  Musculoskeletal: No digital cyanosis, clubbing  Skin: No rashes  Psych: Normal affect and demeanor, alert  and oriented x3    Data Reviewed:  I have personally reviewed following labs and imaging studies  Micro Results Recent Results (from the past 240 hour(s))  MRSA PCR Screening     Status: None   Collection Time: 09/06/18  8:14 PM  Result Value Ref Range Status   MRSA by PCR NEGATIVE NEGATIVE Final    Comment:        The GeneXpert MRSA Assay (FDA approved for NASAL specimens only), is one component of a comprehensive MRSA colonization surveillance program. It is not  intended to diagnose MRSA infection nor to guide or monitor treatment for MRSA infections. Performed at [facility], [place] [address] [place], [place] [address]     Radiology Reports Dg Chest Port 1 View  Result Date: 09/07/2018 CLINICAL DATA:  Patient with overdose. EXAM: PORTABLE CHEST 1 VIEW COMPARISON:  Chest radiograph 09/06/2018 FINDINGS: Monitoring leads overlie the patient. Normal cardiac and mediastinal contours. No consolidative pulmonary opacities. No pleural effusion or pneumothorax. IMPRESSION: No active disease. Electronically Signed   By: Lovey Newcomer M.D.   On: 09/07/2018 14:00   Dg Chest Port 1 View  Result Date: 09/06/2018 CLINICAL DATA:  Leukocytosis, fever. EXAM: PORTABLE CHEST 1 VIEW COMPARISON:  Radiographs May 21, 2018. FINDINGS: The heart size and mediastinal contours are within normal limits. Both lungs are clear. The visualized skeletal structures are unremarkable. IMPRESSION: No active disease. Electronically Signed   By: Marijo Conception, M.D.   On: 09/06/2018 16:53    Lab Data:  CBC: Recent Labs  Lab 09/06/18 1246 09/07/18 0215 09/08/18 1324 09/09/18 0253 09/10/18 0616  WBC 19.1* 9.7 9.2 12.0* 8.9  NEUTROABS  --   --  7.8*  --   --   HGB 14.1 11.4* 11.2* 11.5* 11.7*  HCT 43.2 35.6* 35.8* 37.4* 37.7*  MCV 84.9 86.6 90.2 88.6 89.8  PLT 338 264 242 257 401   Basic Metabolic Panel: Recent Labs  Lab 09/06/18 1246 09/07/18 0215 09/07/18 0809 09/08/18 0712  09/09/18 0253 09/10/18 0616  NA 134* 130*  --  137 137 137  K 4.0 3.6  --  3.9 4.1 3.7  CL 93* 98  --  103 105 103  CO2 27 24  --  28 24 26   GLUCOSE 124* 124*  --  105* 123* 84  BUN 10 10  --  11 22 24*  CREATININE 0.78 0.76  --  0.70 0.81 0.76  CALCIUM 9.1 7.6*  --  8.2* 8.1* 8.1*  MG  --   --  1.8 2.1  --  1.9   GFR: Estimated Creatinine Clearance: 82.2 mL/min (by C-G formula based on SCr of 0.76 mg/dL). Liver Function Tests: Recent Labs  Lab 09/06/18 1246 09/07/18 0215 09/08/18 0712  AST 25 19 15   ALT 20 17 16   ALKPHOS 75 60 58  BILITOT 0.9 1.0 0.7  PROT 7.7 6.2* 5.8*  ALBUMIN 4.1 3.3* 3.1*   Recent Labs  Lab 09/07/18 0804  LIPASE 28   No results for input(s): AMMONIA in the last 168 hours. Coagulation Profile: No results for input(s): INR, PROTIME in the last 168 hours. Cardiac Enzymes: Recent Labs  Lab 09/06/18 2028 09/07/18 0215 09/07/18 0809  TROPONINI <0.03 <0.03 <0.03   BNP (last 3 results) No results for input(s): PROBNP in the last 8760 hours. HbA1C: No results for input(s): HGBA1C in the last 72 hours. CBG: Recent Labs  Lab 09/06/18 1418 09/07/18 1330  GLUCAP 96 183*   Lipid Profile: No results for input(s): CHOL, HDL, LDLCALC, TRIG, CHOLHDL, LDLDIRECT in the last 72 hours. Thyroid Function Tests: No results for input(s): TSH, T4TOTAL, FREET4, T3FREE, THYROIDAB in the last 72 hours. Anemia Panel: No results for input(s): VITAMINB12, FOLATE, FERRITIN, TIBC, IRON, RETICCTPCT in the last 72 hours. Urine analysis:    Component Value Date/Time   COLORURINE YELLOW 09/07/2018 0104   APPEARANCEUR HAZY (A) 09/07/2018 0104   LABSPEC 1.017 09/07/2018 0104   PHURINE 7.0 09/07/2018 0104   GLUCOSEU NEGATIVE 09/07/2018 0104   HGBUR NEGATIVE 09/07/2018  0104   BILIRUBINUR NEGATIVE 09/07/2018 0104   KETONESUR 5 (A) 09/07/2018 0104   PROTEINUR NEGATIVE 09/07/2018 0104   UROBILINOGEN 0.2 05/31/2015 2156   NITRITE NEGATIVE 09/07/2018 0104    LEUKOCYTESUR NEGATIVE 09/07/2018 0104     Claryce Friel M.D. [facility] 09/10/2018, 2:20 PM  Pager: [phone] Between 7am to 7pm - call Pager - [phone]  After 7pm go to [email or web address] - password [email or web address]  Call night coverage person covering after 7pm

## 2018-09-11 ENCOUNTER — Inpatient Hospital Stay (HOSPITAL_COMMUNITY): Payer: Medicaid Other

## 2018-09-11 DIAGNOSIS — R1013 Epigastric pain: Secondary | ICD-10-CM

## 2018-09-11 LAB — LIPASE, BLOOD: Lipase: 30 U/L (ref 11–51)

## 2018-09-11 MED ORDER — DOXYCYCLINE HYCLATE 100 MG PO TABS: 100.0000 mg | ORAL_TABLET | Freq: Two times a day (BID) | ORAL | 0 refills | Status: AC

## 2018-09-11 MED ORDER — FLUTICASONE PROPIONATE 50 MCG/ACT NA SUSP: 2.0000 | Freq: Every day | NASAL | 2 refills | Status: DC

## 2018-09-11 MED ORDER — MOMETASONE FURO-FORMOTEROL FUM 100-5 MCG/ACT IN AERO: 2.0000 | INHALATION_SPRAY | Freq: Every day | RESPIRATORY_TRACT | 0 refills | Status: DC

## 2018-09-11 MED ORDER — SENNOSIDES-DOCUSATE SODIUM 8.6-50 MG PO TABS: 1.0000 | ORAL_TABLET | Freq: Every day | ORAL | 0 refills | Status: DC

## 2018-09-11 MED ORDER — HYDROMORPHONE HCL 1 MG/ML IJ SOLN: 1.0000 mg | INTRAMUSCULAR | Status: DC | PRN

## 2018-09-11 MED ORDER — LORATADINE 10 MG PO TABS: 10.0000 mg | ORAL_TABLET | Freq: Every day | ORAL | 0 refills | Status: DC

## 2018-09-11 MED ORDER — POLYETHYLENE GLYCOL 3350 17 G PO PACK: 17.0000 g | PACK | Freq: Every day | ORAL | 0 refills | Status: DC | PRN

## 2018-09-11 MED ORDER — FLEET ENEMA 7-19 GM/118ML RE ENEM
1.0000 | ENEMA | Freq: Once | RECTAL | Status: AC
  Administered 2018-09-11: 1 via RECTAL
  Filled 2018-09-11: qty 1

## 2018-09-11 MED ORDER — CITALOPRAM HYDROBROMIDE 20 MG PO TABS: 20.0000 mg | ORAL_TABLET | Freq: Every day | ORAL | 0 refills | Status: DC

## 2018-09-11 MED ORDER — THIAMINE HCL 100 MG PO TABS: 100.0000 mg | ORAL_TABLET | Freq: Every day | ORAL | 3 refills | Status: DC

## 2018-09-11 NOTE — Evaluation (Signed)
Physical Therapy Evaluation Patient Details Name: Samuel Reyes MRN: [id number] DOB: [birth year] Today's Date: 09/11/2018   History of Present Illness  Patient is a 63 y/o male presenting to the ED on 09/06/2018 due to overdose. Past medical history significant of COPD, tobacco use, chronic alcohol use, coronary artery disease.    Clinical Impression  Patient admitted with the above listed diagnosis. Patient reports limited mobility with [facility] prior to admission due to pain - unsure of accuracy. Patient today requiring Min A throughout mobility for safety and stability with patient demonstrating unsteady gait pattern increasing his fall risk. Patient to benefit form 24/hr supervision and SNF at discharge to progress safe and independent functional mobility. PT to follow acutely.     Follow Up Recommendations Supervision/Assistance - 24 hour;SNF(inpatient psych)    Equipment Recommendations  Rolling walker with 5" wheels    Recommendations for Other Services       Precautions / Restrictions Precautions Precautions: Fall Restrictions Weight Bearing Restrictions: No      Mobility  Bed Mobility Overal bed mobility: Needs Assistance Bed Mobility: Supine to Sit;Sit to Supine     Supine to sit: Min guard Sit to supine: Min guard   General bed mobility comments: min guard for safety and sequencing  Transfers Overall transfer level: Needs assistance Equipment used: 1 person hand held assist Transfers: Sit to/from [facility] Sit to Stand: Min assist Stand pivot transfers: Min assist       General transfer comment: MIn A for balance and stability, noted wide BOS upon initial standing  Ambulation/Gait Ambulation/Gait assistance: Min assist Gait Distance (Feet): 60 Feet Assistive device: 1 person hand held assist Gait Pattern/deviations: Step-to pattern;Step-through pattern;Decreased stride length;Trunk flexed Gait velocity: decreased   General Gait Details:  instability throughout requiring Min A for safety  Stairs            Wheelchair Mobility    Modified Rankin (Stroke Patients Only)       Balance Overall balance assessment: Needs assistance Sitting-balance support: Bilateral upper extremity supported;Feet supported Sitting balance-Leahy Scale: Fair     Standing balance support: Single extremity supported;During functional activity Standing balance-Leahy Scale: Poor                               Pertinent Vitals/Pain Pain Assessment: 0-10 Pain Score: 8  Pain Location: generalized Pain Descriptors / Indicators: Aching;Grimacing;Guarding;Discomfort Pain Intervention(s): Limited activity within patient's tolerance;Monitored during session;Repositioned    Home Living Family/patient expects to be discharged to:: Shelter/Homeless                      Prior Function Level of Independence: Independent with assistive device(s)         Comments: ambulated with SPC. reports he did not get around much due to numerous surgeries with resultant chronic pain? unsure of accuracy?     Hand Dominance        Extremity/Trunk Assessment   Upper Extremity Assessment Upper Extremity Assessment: Defer to OT evaluation    Lower Extremity Assessment Lower Extremity Assessment: Generalized weakness    Cervical / Trunk Assessment Cervical / Trunk Assessment: Normal  Communication   Communication: No difficulties  Cognition Arousal/Alertness: Awake/alert Behavior During Therapy: WFL for tasks assessed/performed Overall Cognitive Status: Within Functional Limits for tasks assessed  General Comments General comments (skin integrity, edema, etc.): VSS throughout session    Exercises     Assessment/Plan    PT Assessment Patient needs continued PT services  PT Problem List Decreased strength;Decreased activity tolerance;Decreased balance;Decreased  mobility;Decreased knowledge of use of DME;Decreased safety awareness       PT Treatment Interventions DME instruction;Gait training;Stair training;Functional mobility training;Therapeutic activities;Therapeutic exercise;Balance training;Patient/family education    PT Goals (Current goals can be found in the Care Plan section)  Acute Rehab PT Goals Patient Stated Goal: none stated PT Goal Formulation: With patient Time For Goal Achievement: 09/25/18 Potential to Achieve Goals: Good    Frequency Min 2X/week   Barriers to discharge        Co-evaluation               AM-PAC PT "6 Clicks" Mobility  Outcome Measure Help needed turning from your back to your side while in a flat bed without using bedrails?: A Little Help needed moving from lying on your back to sitting on the side of a flat bed without using bedrails?: A Little Help needed moving to and from a bed to a chair (including a wheelchair)?: A Little Help needed standing up from a chair using your arms (e.g., wheelchair or bedside chair)?: A Little Help needed to walk in hospital room?: A Little Help needed climbing 3-5 steps with a railing? : A Lot 6 Click Score: 17    End of Session Equipment Utilized During Treatment: Gait belt Activity Tolerance: Patient tolerated treatment well Patient left: in bed;with call bell/phone within reach;with nursing/sitter in room Nurse Communication: Mobility status PT Visit Diagnosis: Unsteadiness on feet (R26.81);Other abnormalities of gait and mobility (R26.89);Muscle weakness (generalized) (M62.81)    Time: 1610-9604 PT Time Calculation (min) (ACUTE ONLY): 17 min   Charges:   PT Evaluation $PT Eval Moderate Complexity: 1 Mod      Lanney Gins, PT, DPT Supplemental Physical Therapist 09/11/18 9:10 AM Pager: [phone] Office: [phone]

## 2018-09-11 NOTE — Progress Notes (Signed)
LCSW following for inpatient psych.   LCSW followed up with facilities. Facilities requested additional info and updated labs/vitals.   LCSW sent new information to facilities.   LCSW will continue to follow.  Carolin Coy [place]

## 2018-09-11 NOTE — Discharge Summary (Signed)
Physician Discharge Summary   Patient ID: Samuel Reyes MRN: [id number] DOB/AGE: May 06, 1956 63 y.o.  Admit date: 09/06/2018 Discharge date: 09/11/2018  Primary Care Physician:  Patient, No Pcp Per   Recommendations for Outpatient Follow-up:  1. Follow up with PCP in 1-2 weeks 2. Follow QTC on the EKG periodically  Home Health: Patient being transferred to psych facility Equipment/Devices:   Discharge Condition: stable  CODE STATUS: FULL  Diet recommendation: Heart healthy diet   Discharge Diagnoses:    . Intentional overdose   Suicide attempt . Tobacco abuse . MDD (major depressive disorder), recurrent severe, without psychosis ([place]) . GERD (gastroesophageal reflux disease) with history of hiatal hernia . Acute COPD exacerbation ([place]) . Alcohol use disorder, severe, dependence ([place]) with withdrawals . QT prolongation   Anxiety Constipation   Consults: Psychiatry    Allergies:   Allergies  Allergen Reactions  . Bee Venom Anaphylaxis  . Penicillins Rash    Has patient had a PCN reaction causing immediate rash, facial/tongue/throat swelling, SOB or lightheadedness with hypotension: {Yes Has patient had a PCN reaction causing severe rash involving mucus membranes or skin necrosis: YES Has patient had a PCN reaction that required hospitalization Yes Has patient had a PCN reaction occurring within the last 10 years: YES If all of the above answers are "NO", then may proceed with Cephalosporin use.   . Vancomycin Tinitus     DISCHARGE MEDICATIONS: Allergies as of 09/11/2018      Reactions   Bee Venom Anaphylaxis   Penicillins Rash   Has patient had a PCN reaction causing immediate rash, facial/tongue/throat swelling, SOB or lightheadedness with hypotension: {Yes Has patient had a PCN reaction causing severe rash involving mucus membranes or skin necrosis: YES Has patient had a PCN reaction that required hospitalization Yes Has patient had a PCN reaction  occurring within the last 10 years: YES If all of the above answers are "NO", then may proceed with Cephalosporin use.   Vancomycin Tinitus      Medication List    TAKE these medications   acetaminophen 500 MG tablet Commonly known as:  TYLENOL Take 1,000 mg by mouth every 6 (six) hours as needed for mild pain.   albuterol 108 (90 Base) MCG/ACT inhaler Commonly known as:  PROVENTIL HFA;VENTOLIN HFA Inhale 2 puffs into the lungs every 4 (four) hours as needed for wheezing or shortness of breath.   citalopram 20 MG tablet Commonly known as:  CELEXA Take 1 tablet (20 mg total) by mouth daily. Start taking on:  September 12, 2018 What changed:    medication strength  how much to take   diclofenac sodium 1 % Gel Commonly known as:  VOLTAREN Apply 2 g topically daily.   doxycycline 100 MG tablet Commonly known as:  VIBRA-TABS Take 1 tablet (100 mg total) by mouth 2 (two) times daily for 2 days.   fluticasone 50 MCG/ACT nasal spray Commonly known as:  FLONASE Place 2 sprays into both nostrils daily. Start taking on:  September 12, 930   folic acid 1 MG tablet Commonly known as:  FOLVITE Take 1 mg by mouth daily.   gabapentin 300 MG capsule Commonly known as:  NEURONTIN Take 300 mg by mouth 3 (three) times daily.   loratadine 10 MG tablet Commonly known as:  CLARITIN Take 1 tablet (10 mg total) by mouth daily. Start taking on:  September 12, 2018   mometasone-formoterol 100-5 MCG/ACT Aero Commonly known as:  DULERA Inhale 2 puffs into the lungs  daily.   multivitamin with minerals Tabs tablet Take 1 tablet by mouth daily. Vitamin supplementation   pantoprazole 40 MG tablet Commonly known as:  PROTONIX Take 40 mg by mouth 2 (two) times daily.   polyethylene glycol packet Commonly known as:  MIRALAX Take 17 g by mouth daily as needed for moderate constipation.   propranolol 10 MG tablet Commonly known as:  INDERAL Take 1 tablet (10 mg total) by mouth 3 (three)  times daily. For anxiety   senna-docusate 8.6-50 MG tablet Commonly known as:  Senokot-S Take 1 tablet by mouth at bedtime.   thiamine 100 MG tablet Take 1 tablet (100 mg total) by mouth daily. Start taking on:  September 12, 2018   tiotropium 18 MCG inhalation capsule Commonly known as:  SPIRIVA Place 1 capsule (18 mcg total) into inhaler and inhale daily. For COPD   traZODone 100 MG tablet Commonly known as:  DESYREL Take 1 tablet (100 mg total) by mouth at bedtime as needed for sleep.            Durable Medical Equipment  (From admission, onward)         Start     Ordered   09/11/18 0947  For home use only DME Walker rolling  Once    Question:  Patient needs a walker to treat with the following condition  Answer:  Gait abnormality   09/11/18 0946           Brief H and P: For complete details please refer to admission H and P, but in brief Patient is 63 year old gentleman history of COPD, tobacco use, chronic alcohol use, coronary artery disease presented to the hospital with overdose. Patient on admission noted to be mildly confused and stated he took a bunch of pills started drinking alcohol until he passed out. Patient noted to be tremulous. Patient noted to be upset that he was not successful with his suicidal attempt. Patient also with complaints of shortness of breath, productive cough, upper abdominal pain. Patient was admitted to the stepdown unit, placed on Ativan withdrawal protocol, one-to-one sitter, and treated for acute COPD exacerbation.  Hospital Course:  Overdose in the setting of depression, suicidal attempt -Patient apparently took a bunch of pills and drank alcohol until he passed out with suicidal attempt. -Patient reported 2 of his children has died, 3 years after his son died, his wife also passed -Psychiatry consultation was obtained, patient was placed with one-to-one sitter. -Per recommendations, patient was placed on Librium and Ativan  protocol, trazodone nightly as needed for sleep.  Celexa was initially held due to prolonged QTC which has improved, now restarted at 20 mg daily.  Follow QTC periodically.  Avoid QTC prolonging drugs -Medically cleared for inpatient psych facility    Alcohol abuse with alcohol withdrawals -Patient reported to be drinking more than 40 beers a day, went into DT requiring several bouts of IV Ativan -Currently medically stable, continue thiamine, folic acid, MVI -Continue CIWA with Librium taper protocol and Ativan, CIWA low 1-4  Acute COPD exacerbation -Currently much improved, chest x-ray negative for acute infiltrates -Continue doxycycline, scheduled nebs, steroids    GERD (gastroesophageal reflux disease) -Continue PPI twice daily -Tolerating diet without any difficulty  Tobacco abuse Recommended tobacco cessation, continue nicotine patch  Anxiety -Continue propranolol given patient's tachycardia  CAD Cardiac enzymes negative x3, no acute issues  Constipation Patient was complaining abdominal pain, lipase normal, abdominal x-ray showed constipation, Fleet enema x1 Placed on Senokot-S and MiraLAX  as needed  Day of Discharge S: Complaining of constipation and abdominal pain otherwise no acute issues  BP 114/76 (BP Location: Right Arm)   Pulse 90   Temp (!) 97.5 F (36.4 C) (Oral)   Resp 18   Ht 5\' 5"  (1.651 m)   Wt 68.5 kg   SpO2 99%   BMI 25.13 kg/m   Physical Exam: General: Alert and awake oriented x3 not in any acute distress. HEENT: anicteric sclera, pupils reactive to light and accommodation CVS: S1-S2 clear no murmur rubs or gallops Chest: clear to auscultation bilaterally, no wheezing rales or rhonchi Abdomen: soft nontender, nondistended, normal bowel sounds Extremities: no cyanosis, clubbing or edema noted bilaterally Neuro: Cranial nerves II-XII intact, no focal neurological deficits   The results of significant diagnostics from this  hospitalization (including imaging, microbiology, ancillary and laboratory) are listed below for reference.      Procedures/Studies:  Dg Chest Port 1 View  Result Date: 09/07/2018 CLINICAL DATA:  Patient with overdose. EXAM: PORTABLE CHEST 1 VIEW COMPARISON:  Chest radiograph 09/06/2018 FINDINGS: Monitoring leads overlie the patient. Normal cardiac and mediastinal contours. No consolidative pulmonary opacities. No pleural effusion or pneumothorax. IMPRESSION: No active disease. Electronically Signed   By: Lovey Newcomer M.D.   On: 09/07/2018 14:00   Dg Chest Port 1 View  Result Date: 09/06/2018 CLINICAL DATA:  Leukocytosis, fever. EXAM: PORTABLE CHEST 1 VIEW COMPARISON:  Radiographs May 21, 2018. FINDINGS: The heart size and mediastinal contours are within normal limits. Both lungs are clear. The visualized skeletal structures are unremarkable. IMPRESSION: No active disease. Electronically Signed   By: Marijo Conception, M.D.   On: 09/06/2018 16:53   Dg Abd 2 Views  Result Date: 09/11/2018 CLINICAL DATA:  Abdominal pain, distension and epigastric pain. EXAM: ABDOMEN - 2 VIEW COMPARISON:  05/21/2018 FINDINGS: The bowel gas pattern is nonobstructive/nonspecific. There is no evidence of free air. Postsurgical changes at the GE junction. Large amount of formed stool in the right colon. IMPRESSION: Nonobstructive bowel gas pattern. Constipation. Electronically Signed   By: Fidela Salisbury M.D.   On: 09/11/2018 11:39       LAB RESULTS: Basic Metabolic Panel: Recent Labs  Lab 09/09/18 0253 09/10/18 0616  NA 137 137  K 4.1 3.7  CL 105 103  CO2 24 26  GLUCOSE 123* 84  BUN 22 24*  CREATININE 0.81 0.76  CALCIUM 8.1* 8.1*  MG  --  1.9   Liver Function Tests: Recent Labs  Lab 09/07/18 0215 09/08/18 0712  AST 19 15  ALT 17 16  ALKPHOS 60 58  BILITOT 1.0 0.7  PROT 6.2* 5.8*  ALBUMIN 3.3* 3.1*   Recent Labs  Lab 09/07/18 0804 09/11/18 1015  LIPASE 28 30   No results for  input(s): AMMONIA in the last 168 hours. CBC: Recent Labs  Lab 09/08/18 0712 09/09/18 0253 09/10/18 0616  WBC 9.2 12.0* 8.9  NEUTROABS 7.8*  --   --   HGB 11.2* 11.5* 11.7*  HCT 35.8* 37.4* 37.7*  MCV 90.2 88.6 89.8  PLT 242 257 226   Cardiac Enzymes: Recent Labs  Lab 09/07/18 0215 09/07/18 0809  TROPONINI <0.03 <0.03   BNP: Invalid input(s): POCBNP CBG: Recent Labs  Lab 09/06/18 1418 09/07/18 1330  GLUCAP 96 183*      Disposition and Follow-up: Discharge Instructions    Diet - low sodium heart healthy   Complete by:  As directed    Increase activity slowly   Complete by:  As directed        DISPOSITION: Inpatient psych facility   DISCHARGE FOLLOW-UP    Time coordinating discharge:  35 minutes  Signed:   Estill Cotta M.D. [facility] 09/11/2018, 1:14 PM

## 2018-09-11 NOTE — Progress Notes (Signed)
Patient has bed at [facility]. LCSW paged attending.   Accepting: Dr. Dareen Piano RN report #: [phone] Patient to report to [facility].   Patient is voluntary and will transport by B[facility] Pries.   Servando Snare, Shawna Clamp [place]

## 2018-09-11 NOTE — Progress Notes (Signed)
At referral from social work, Clinical biochemist provided shoes, pants and shirt for pt transfer.

## 2018-09-11 NOTE — Progress Notes (Signed)
Pt's shirt and shoes are wet in his belonging bag.  Wallet present but no coat and no coat charged on arrival.  Pt believes coat may be at the location he was picked up at by the ambulance. All belongings transferred with pt to [facility] via [facility] services brought pt clothes and shoes.

## 2018-10-02 ENCOUNTER — Emergency Department (HOSPITAL_COMMUNITY): Payer: Medicaid Other

## 2018-10-02 ENCOUNTER — Other Ambulatory Visit: Payer: Self-pay

## 2018-10-02 ENCOUNTER — Encounter (HOSPITAL_COMMUNITY): Payer: Self-pay

## 2018-10-02 ENCOUNTER — Emergency Department (HOSPITAL_COMMUNITY)
Admission: EM | Admit: 2018-10-02 | Discharge: 2018-10-03 | Disposition: A | Discharge status: Other Acute Inpatient Hospital | Payer: Medicaid Other | Attending: Emergency Medicine | Admitting: Emergency Medicine

## 2018-10-02 DIAGNOSIS — F1010 Alcohol abuse, uncomplicated: Secondary | ICD-10-CM

## 2018-10-02 DIAGNOSIS — X31XXXA Exposure to excessive natural cold, initial encounter: Secondary | ICD-10-CM | POA: Insufficient documentation

## 2018-10-02 DIAGNOSIS — F332 Major depressive disorder, recurrent severe without psychotic features: Secondary | ICD-10-CM

## 2018-10-02 DIAGNOSIS — T68XXXA Hypothermia, initial encounter: Secondary | ICD-10-CM

## 2018-10-02 DIAGNOSIS — F32A Depression, unspecified: Secondary | ICD-10-CM

## 2018-10-02 DIAGNOSIS — F329 Major depressive disorder, single episode, unspecified: Secondary | ICD-10-CM

## 2018-10-02 LAB — CBC WITH DIFFERENTIAL/PLATELET
Abs Immature Granulocytes: 0.02 10*3/uL (ref 0.00–0.07)
Basophils Absolute: 0 10*3/uL (ref 0.0–0.1)
Basophils Relative: 0 %
Eosinophils Absolute: 0 10*3/uL (ref 0.0–0.5)
Eosinophils Relative: 0 %
HCT: 44.9 % (ref 39.0–52.0)
Hemoglobin: 13.7 g/dL (ref 13.0–17.0)
Immature Granulocytes: 0 %
Lymphocytes Relative: 24 %
Lymphs Abs: 1.4 10*3/uL (ref 0.7–4.0)
MCH: 27.7 pg (ref 26.0–34.0)
MCHC: 30.5 g/dL (ref 30.0–36.0)
MCV: 90.9 fL (ref 80.0–100.0)
Monocytes Absolute: 0.6 10*3/uL (ref 0.1–1.0)
Monocytes Relative: 11 %
Neutro Abs: 3.9 10*3/uL (ref 1.7–7.7)
Neutrophils Relative %: 65 %
Platelets: 428 10*3/uL — ABNORMAL HIGH (ref 150–400)
RBC: 4.94 MIL/uL (ref 4.22–5.81)
RDW: 18.9 % — ABNORMAL HIGH (ref 11.5–15.5)
WBC: 6 10*3/uL (ref 4.0–10.5)
nRBC: 0 % (ref 0.0–0.2)

## 2018-10-02 LAB — COMPREHENSIVE METABOLIC PANEL
ALT: 19 U/L (ref 0–44)
AST: 26 U/L (ref 15–41)
Albumin: 3.8 g/dL (ref 3.5–5.0)
Alkaline Phosphatase: 73 U/L (ref 38–126)
Anion gap: 11 (ref 5–15)
BUN: 6 mg/dL — ABNORMAL LOW (ref 8–23)
CO2: 21 mmol/L — ABNORMAL LOW (ref 22–32)
Calcium: 8.6 mg/dL — ABNORMAL LOW (ref 8.9–10.3)
Chloride: 107 mmol/L (ref 98–111)
Creatinine, Ser: 0.49 mg/dL — ABNORMAL LOW (ref 0.61–1.24)
GFR calc Af Amer: >60 mL/min (ref >60)
GFR calc non Af Amer: >60 mL/min (ref >60)
Glucose, Bld: 119 mg/dL — ABNORMAL HIGH (ref 70–99)
Potassium: 3.8 mmol/L (ref 3.5–5.1)
Sodium: 139 mmol/L (ref 135–145)
Total Bilirubin: 0.4 mg/dL (ref 0.3–1.2)
Total Protein: 7.2 g/dL (ref 6.5–8.1)

## 2018-10-02 LAB — URINALYSIS, ROUTINE W REFLEX MICROSCOPIC
Bilirubin Urine: NEGATIVE
Glucose, UA: NEGATIVE mg/dL
Hgb urine dipstick: NEGATIVE
Ketones, ur: NEGATIVE mg/dL
Leukocytes,Ua: NEGATIVE
Nitrite: NEGATIVE
Protein, ur: NEGATIVE mg/dL
Specific Gravity, Urine: 1.009 (ref 1.005–1.030)
pH: 5 (ref 5.0–8.0)

## 2018-10-02 MED ORDER — LORAZEPAM 1 MG PO TABS
0.0000 mg | ORAL_TABLET | Freq: Four times a day (QID) | ORAL | Status: DC
  Administered 2018-10-03 (×2): 1 mg via ORAL
  Filled 2018-10-02 (×2): qty 1

## 2018-10-02 MED ORDER — SODIUM CHLORIDE 0.9 % IV BOLUS
1000.0000 mL | Freq: Once | INTRAVENOUS | Status: AC
  Administered 2018-10-02: 1000 mL via INTRAVENOUS

## 2018-10-02 MED ORDER — LORAZEPAM 2 MG/ML IJ SOLN
1.0000 mg | Freq: Once | INTRAMUSCULAR | Status: AC
  Administered 2018-10-02: 1 mg via INTRAVENOUS
  Filled 2018-10-02: qty 1

## 2018-10-02 MED ORDER — LORAZEPAM 2 MG/ML IJ SOLN: 0.0000 mg | Freq: Four times a day (QID) | INTRAMUSCULAR | Status: DC

## 2018-10-02 MED ORDER — THIAMINE HCL 100 MG/ML IJ SOLN
100.0000 mg | Freq: Once | INTRAMUSCULAR | Status: AC
  Administered 2018-10-02: 100 mg via INTRAVENOUS
  Filled 2018-10-02: qty 2

## 2018-10-02 MED ORDER — LORAZEPAM 1 MG PO TABS: 0.0000 mg | ORAL_TABLET | Freq: Two times a day (BID) | ORAL | Status: DC

## 2018-10-02 MED ORDER — VITAMIN B-1 100 MG PO TABS
100.0000 mg | ORAL_TABLET | Freq: Every day | ORAL | Status: DC
  Administered 2018-10-03: 100 mg via ORAL
  Filled 2018-10-02: qty 1

## 2018-10-02 MED ORDER — SODIUM CHLORIDE 0.9 % IV SOLN
INTRAVENOUS | Status: DC
  Administered 2018-10-02: 12:00:00 via INTRAVENOUS

## 2018-10-02 MED ORDER — LORAZEPAM 2 MG/ML IJ SOLN: 0.0000 mg | Freq: Two times a day (BID) | INTRAMUSCULAR | Status: DC

## 2018-10-02 MED ORDER — THIAMINE HCL 100 MG/ML IJ SOLN: 100.0000 mg | Freq: Every day | INTRAMUSCULAR | Status: DC

## 2018-10-02 MED ORDER — ADULT MULTIVITAMIN W/MINERALS CH
1.0000 | ORAL_TABLET | Freq: Once | ORAL | Status: AC
  Administered 2018-10-02: 1 via ORAL
  Filled 2018-10-02: qty 1

## 2018-10-02 NOTE — ED Notes (Signed)
Bed: KV35 Expected date: 10/02/18 Expected time: 9:35 AM Means of arrival:  Comments:
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
## 2018-10-02 NOTE — ED Notes (Signed)
Bair Hugger applied to pt for rectal temp of 92.2

## 2018-10-02 NOTE — ED Triage Notes (Signed)
Pt continually stating I just want to die. Pt states that his family is all dead and he wants to be with them
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
## 2018-10-02 NOTE — ED Notes (Signed)
Case manager stated cane should be  Here by 1815 if not he can pick one up in the community.
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
## 2018-10-02 NOTE — Progress Notes (Signed)
CSW acknowledges consult for cane. SW aware that RNCM is working on getting pt what he needs. No further CSW needs CSW will sign off at this time.      Virgie Dad Obelia Bonello, MSW, [facility] Clinical Social Worker [phone]

## 2018-10-02 NOTE — BHH Counselor (Signed)
Pt denies having family, friends supports for clinician to contact to obtain additional information.    Vertell Novak, [place], [facility], [facility] Triage Specialist [phone]
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
## 2018-10-02 NOTE — Progress Notes (Signed)
Contacted [employer] for a cane. Will deliver to pt's room. Jonnie Finner RN CCM Case Mgmt phone [phone]

## 2018-10-02 NOTE — ED Notes (Signed)
Pt provided with sandwich and drink 

## 2018-10-02 NOTE — ED Notes (Signed)
Bed: WH87 Expected date:  Expected time:  Means of arrival:  Comments: RM 14

## 2018-10-02 NOTE — ED Provider Notes (Signed)
[facility] DEPT Provider Note   CSN: [id number] Arrival date & time: 10/02/18  0931    History   Chief Complaint Chief Complaint  Patient presents with  . Alcohol Intoxication  . Cold Exposure  . Suicidal    HPI Samuel Reyes is a 64 y.o. male.     HPI   23yM with alcohol intoxication. Says he drank heavily and passed out. Became cold and wet. Brought in by EMS from homeless shelter. He says he is cold. Denies trauma. Denies ingestion aside form etoh. Says he doesn't care about what happens to him because he feels like he has nothing to live for anymore.   Past Medical History:  Diagnosis Date  . Alcoholism ([place])   . CAD (coronary artery disease) 2002   MI, no intervention required  . COPD (chronic obstructive pulmonary disease) (HCC)    not on home O2  . Depression   . GERD (gastroesophageal reflux disease)   . Headache   . Hypertension   . Stroke [facility]) 2006    Patient Active Problem List   Diagnosis Date Noted  . QT prolongation 09/09/2018  . Overdose 09/06/2018  . MDD (major depressive disorder), recurrent episode, severe ([place]) 07/18/2018  . MDD (major depressive disorder), recurrent severe, without psychosis ([place]) 08/26/2017  . Alcohol withdrawal ([place]) 08/08/2017  . COPD with acute bronchitis ([place]) 07/29/2017  . Alcohol use   . Homelessness 06/24/2017  . Lung nodule 09/03/2016  . Tobacco abuse 09/03/2016  . Insomnia 09/03/2016  . HCAP (healthcare-associated pneumonia) 08/22/2016  . Bronchiolitis 08/21/2016  . Malnutrition of moderate degree 08/04/2016  . Atrial tachycardia ([place]) 08/02/2016  . Impaired glucose tolerance 08/02/2016  . Alcohol abuse with alcohol-induced mood disorder ([place]) 12/12/2015  . Syncope 11/15/2015  . Chest pain 11/15/2015  . Nausea vomiting and diarrhea 11/15/2015  . Abdominal pain 11/15/2015  . Major depressive disorder, recurrent episode, moderate with anxious distress ([place]) 10/30/2015  .  Alcohol use disorder, severe, dependence ([place]) 10/29/2015  . COPD exacerbation ([place]) 09/20/2015  . Acute respiratory failure with hypoxia ([place]) 09/18/2015  . Suicidal ideation 09/17/2015  . Alcohol intoxication ([place]) 09/17/2015  . Depression 09/17/2015  . Benign essential HTN 09/17/2015  . Hypokalemia 09/17/2015  . Hyponatremia 09/17/2015  . Coffee ground emesis 09/17/2015  . COPD (chronic obstructive pulmonary disease) ([place]) 02/17/2013  . GERD (gastroesophageal reflux disease) 02/17/2013    Past Surgical History:  Procedure Laterality Date  . BACK SURGERY     3 cervical spine surgeries C4-C5 fused  . COLONOSCOPY N/A 01/04/2014   Procedure: COLONOSCOPY;  Surgeon: Danie Binder, MD;  Location: [facility];  Service: Endoscopy;  Laterality: N/A;  1:45  . ESOPHAGOGASTRODUODENOSCOPY N/A 01/04/2014   Procedure: ESOPHAGOGASTRODUODENOSCOPY (EGD);  Surgeon: Danie Binder, MD;  Location: [facility]ITE;  Service: Endoscopy;  Laterality: N/A;  . FINGER SURGERY Left    2nd, 3rd, & 4th fingers were cut off by table saw and reattached  . GASTRECTOMY    . HERNIA REPAIR    . INCISIONAL HERNIA REPAIR N/A 01/20/2014   Procedure: LAPAROSCOPIC RECURRENT  INCISIONAL HERNIA with mesh;  Surgeon: Edward Jolly, MD;  Location: [facility] ORS;  Service: General;  Laterality: N/A;  . rt knee arthroscopic surgery    . SHOULDER SURGERY Bilateral    3 surgeries on on left, 2 surgeries on right       Home Medications    Prior to Admission medications   Medication Sig Start Date  End Date Taking? Authorizing Provider  acetaminophen (TYLENOL) 500 MG tablet Take 1,000 mg by mouth every 6 (six) hours as needed for mild pain.    [provider]  albuterol (PROVENTIL HFA;VENTOLIN HFA) 108 (90 Base) MCG/ACT inhaler Inhale 2 puffs into the lungs every 4 (four) hours as needed for wheezing or shortness of breath. 07/22/18   Lindell Spar I, NP  citalopram (CELEXA) 20 MG tablet Take 1 tablet (20 mg total) by  mouth daily. 09/12/18   Rai, Vernelle Emerald, MD  diclofenac sodium (VOLTAREN) 1 % GEL Apply 2 g topically daily. 08/01/18 08/01/19  [provider]  fluticasone (FLONASE) 50 MCG/ACT nasal spray Place 2 sprays into both nostrils daily. 09/12/18   Rai, Vernelle Emerald, MD  folic acid (FOLVITE) 1 MG tablet Take 1 mg by mouth daily. 06/18/18 08/01/19  [provider]  gabapentin (NEURONTIN) 300 MG capsule Take 300 mg by mouth 3 (three) times daily. 07/31/18   [provider]  loratadine (CLARITIN) 10 MG tablet Take 1 tablet (10 mg total) by mouth daily. 09/12/18   Rai, Ripudeep K, MD  mometasone-formoterol (DULERA) 100-5 MCG/ACT AERO Inhale 2 puffs into the lungs daily. 09/11/18   Rai, Vernelle Emerald, MD  Multiple Vitamin (MULTIVITAMIN WITH MINERALS) TABS tablet Take 1 tablet by mouth daily. Vitamin supplementation Patient not taking: Reported on 09/06/2018 07/23/18   Lindell Spar I, NP  pantoprazole (PROTONIX) 40 MG tablet Take 40 mg by mouth 2 (two) times daily. 06/18/18   [provider]  polyethylene glycol (MIRALAX) packet Take 17 g by mouth daily as needed for moderate constipation. 09/11/18   Rai, Vernelle Emerald, MD  propranolol (INDERAL) 10 MG tablet Take 1 tablet (10 mg total) by mouth 3 (three) times daily. For anxiety 07/22/18   Lindell Spar I, NP  senna-docusate (SENOKOT-S) 8.6-50 MG tablet Take 1 tablet by mouth at bedtime. 09/11/18   Rai, Vernelle Emerald, MD  thiamine 100 MG tablet Take 1 tablet (100 mg total) by mouth daily. 09/12/18   Rai, Vernelle Emerald, MD  tiotropium (SPIRIVA) 18 MCG inhalation capsule Place 1 capsule (18 mcg total) into inhaler and inhale daily. For COPD 07/23/18   Lindell Spar I, NP  traZODone (DESYREL) 100 MG tablet Take 1 tablet (100 mg total) by mouth at bedtime as needed for sleep. 07/22/18   Encarnacion Slates, NP    Family History Family History  Problem Relation Age of Onset  . Cancer Father        bone  . Cancer Brother        lungs  . Stroke Maternal  Grandmother   . Asthma Son        died at age 4 in his sleep   . Spina bifida Son        died at age 83   . Dementia Mother   . Colon cancer Neg Hx     Social History Social History   Tobacco Use  . Smoking status: Current Every Day Smoker    Packs/day: 2.00    Years: 52.00    Pack years: 104.00    Types: Cigarettes    Start date: 07/30/1966  . Smokeless tobacco: Never Used  . Tobacco comment: down to 1/4 ppd  Substance Use Topics  . Alcohol use: Yes    Alcohol/week: 3.0 - 4.0 standard drinks    Types: 3 - 4 Cans of beer per week    Comment: everyday- 3-4 40 oz  . Drug use: No  Comment: denied using any drugs     Allergies   Bee venom; Penicillins; and Vancomycin   Review of Systems Review of Systems   All systems reviewed and negative, other than as noted in HPI.  Physical Exam Updated Vital Signs BP 99/68   Pulse 74   Temp (!) 92.2 F (33.4 C) (Rectal)   Resp 15   Wt 68 kg   SpO2 99%   BMI 24.96 kg/m   Physical Exam Vitals signs and nursing note reviewed.  Constitutional:      General: He is not in acute distress.    Appearance: He is well-developed.     Comments: Laying in bed. Drowsy. Disheveled appearance. Opens eyes to voice. Answering questions appropriately.   HENT:     Head: Normocephalic and atraumatic.  Eyes:     General:        Right eye: No discharge.        Left eye: No discharge.     Conjunctiva/sclera: Conjunctivae normal.  Neck:     Musculoskeletal: Neck supple.  Cardiovascular:     Rate and Rhythm: Normal rate and regular rhythm.     Heart sounds: Normal heart sounds. No murmur. No friction rub. No gallop.   Pulmonary:     Effort: Pulmonary effort is normal. No respiratory distress.     Breath sounds: Normal breath sounds.  Abdominal:     General: There is no distension.     Palpations: Abdomen is soft.     Tenderness: There is no abdominal tenderness.  Musculoskeletal:        General: No tenderness.  Skin:    General:  Skin is warm and dry.  Neurological:     Mental Status: He is alert.  Psychiatric:        Behavior: Behavior normal.        Thought Content: Thought content normal.    ED Treatments / Results  Labs (all labs ordered are listed, but only abnormal results are displayed) Labs Reviewed  CBC WITH DIFFERENTIAL/PLATELET - Abnormal; Notable for the following components:      Result Value   RDW 18.9 (*)    Platelets 428 (*)    All other components within normal limits  COMPREHENSIVE METABOLIC PANEL - Abnormal; Notable for the following components:   CO2 21 (*)    Glucose, Bld 119 (*)    BUN 6 (*)    Creatinine, Ser 0.49 (*)    Calcium 8.6 (*)    All other components within normal limits    EKG EKG Interpretation  Date/Time:  Thursday October 02 2018 12:34:04 EST Ventricular Rate:  94 PR Interval:    QRS Duration: 100 QT Interval:  390 QTC Calculation: 488 R Axis:   80 Text Interpretation:  Sinus rhythm Borderline prolonged QT interval Confirmed by Virgel Manifold [phone]) on 10/02/2018 2:21:53 PM   Radiology Dg Chest Portable 1 View  Result Date: 10/02/2018 CLINICAL DATA:  Hypothermia. Hypotension. Clinical concern for possible sepsis. EXAM: PORTABLE CHEST 1 VIEW COMPARISON:  09/07/2018. FINDINGS: Normal sized heart. Clear lungs with normal vasculature. Unremarkable bones. IMPRESSION: No acute disease. Electronically Signed   By: Claudie Revering M.D.   On: 10/02/2018 13:09    Procedures Procedures (including critical care time)  Medications Ordered in ED Medications  0.9 %  sodium chloride infusion (has no administration in time range)  LORazepam (ATIVAN) injection 1 mg (1 mg Intravenous Given 10/02/18 1032)     Initial Impression / Assessment and  Plan / ED Course  I have reviewed the triage vital signs and the nursing notes.  Pertinent labs & imaging results that were available during my care of the patient were reviewed by me and considered in my medical decision making (see  chart for details).  62yM with etoh abuse and passive SI. He has a long history of the same. He tells me he has no interest in stopping alcohol. He says his wife and children and dead and he just wants to be in heaven with them. He says he has lived as long as he cares to. 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   He arrived hypothermic. He was given warmed fluids and placed on a warming blanket. He is now euthermic.  He has a chronically unsteady gait. He is using an umbrella as a cane. Will see if we can get him a walking cane. He is chronically suicidal. He has no interest in stopping alcohol at this time. Unfortunate situation, but he will be discharged with resources.   Final Clinical Impressions(s) / ED Diagnoses   Final diagnoses:  Hypothermia, initial encounter  Alcohol abuse  Depression, unspecified depression type    ED Discharge Orders    None       Virgel Manifold, MD 10/03/18 [phone]

## 2018-10-02 NOTE — ED Notes (Signed)
Ambulated pt with Wilson Singer MD at bedside.

## 2018-10-02 NOTE — ED Notes (Signed)
Dr. Wilson Singer informed about pt's BP of 89/58.  Verbal order given to change the 119ml infusion to bolus.

## 2018-10-02 NOTE — ED Notes (Signed)
Pt requesting staff to call mother. Mother notified that pt is here per pt request

## 2018-10-02 NOTE — ED Notes (Signed)
Sitter at bedside.

## 2018-10-03 ENCOUNTER — Inpatient Hospital Stay (HOSPITAL_COMMUNITY)
Admission: AD | Admit: 2018-10-03 | Discharge: 2018-10-08 | DRG: 885 | Disposition: A | Discharge status: Home / Self Care | Payer: Medicaid Other | Source: Intra-hospital | Attending: Psychiatry | Admitting: Psychiatry

## 2018-10-03 ENCOUNTER — Encounter (HOSPITAL_COMMUNITY): Payer: Self-pay

## 2018-10-03 ENCOUNTER — Other Ambulatory Visit: Payer: Self-pay

## 2018-10-03 DIAGNOSIS — Z789 Other specified health status: Secondary | ICD-10-CM

## 2018-10-03 DIAGNOSIS — F1020 Alcohol dependence, uncomplicated: Secondary | ICD-10-CM | POA: Diagnosis present

## 2018-10-03 DIAGNOSIS — F17210 Nicotine dependence, cigarettes, uncomplicated: Secondary | ICD-10-CM | POA: Diagnosis not present

## 2018-10-03 DIAGNOSIS — G4700 Insomnia, unspecified: Secondary | ICD-10-CM | POA: Diagnosis present

## 2018-10-03 DIAGNOSIS — Z7289 Other problems related to lifestyle: Secondary | ICD-10-CM | POA: Diagnosis not present

## 2018-10-03 DIAGNOSIS — F332 Major depressive disorder, recurrent severe without psychotic features: Principal | ICD-10-CM | POA: Diagnosis present

## 2018-10-03 DIAGNOSIS — I2510 Atherosclerotic heart disease of native coronary artery without angina pectoris: Secondary | ICD-10-CM | POA: Diagnosis not present

## 2018-10-03 DIAGNOSIS — Z880 Allergy status to penicillin: Secondary | ICD-10-CM

## 2018-10-03 DIAGNOSIS — Z915 Personal history of self-harm: Secondary | ICD-10-CM | POA: Diagnosis not present

## 2018-10-03 DIAGNOSIS — J449 Chronic obstructive pulmonary disease, unspecified: Secondary | ICD-10-CM | POA: Diagnosis not present

## 2018-10-03 DIAGNOSIS — Z590 Homelessness: Secondary | ICD-10-CM

## 2018-10-03 MED ORDER — ONDANSETRON 4 MG PO TBDP: 4.0000 mg | ORAL_TABLET | Freq: Four times a day (QID) | ORAL | Status: DC | PRN

## 2018-10-03 MED ORDER — ALUM & MAG HYDROXIDE-SIMETH 200-200-20 MG/5ML PO SUSP: 30.0000 mL | ORAL | Status: DC | PRN

## 2018-10-03 MED ORDER — MAGNESIUM HYDROXIDE 400 MG/5ML PO SUSP: 30.0000 mL | Freq: Every day | ORAL | Status: DC | PRN

## 2018-10-03 MED ORDER — ADULT MULTIVITAMIN W/MINERALS CH
1.0000 | ORAL_TABLET | Freq: Every day | ORAL | Status: DC
  Administered 2018-10-04 – 2018-10-08 (×5): 1 via ORAL
  Filled 2018-10-03 (×8): qty 1

## 2018-10-03 MED ORDER — VITAMIN B-1 100 MG PO TABS
100.0000 mg | ORAL_TABLET | Freq: Every day | ORAL | Status: DC
  Administered 2018-10-04 – 2018-10-08 (×5): 100 mg via ORAL
  Filled 2018-10-03 (×8): qty 1

## 2018-10-03 MED ORDER — NICOTINE 21 MG/24HR TD PT24
21.0000 mg | MEDICATED_PATCH | Freq: Every day | TRANSDERMAL | Status: DC
  Administered 2018-10-03 – 2018-10-08 (×6): 21 mg via TRANSDERMAL
  Filled 2018-10-03 (×9): qty 1

## 2018-10-03 MED ORDER — HYDROXYZINE HCL 25 MG PO TABS
25.0000 mg | ORAL_TABLET | Freq: Three times a day (TID) | ORAL | Status: DC | PRN
  Administered 2018-10-03 – 2018-10-06 (×6): 25 mg via ORAL
  Filled 2018-10-03 (×6): qty 1

## 2018-10-03 MED ORDER — ACETAMINOPHEN 325 MG PO TABS
650.0000 mg | ORAL_TABLET | Freq: Once | ORAL | Status: AC
  Administered 2018-10-03: 650 mg via ORAL
  Filled 2018-10-03: qty 2

## 2018-10-03 MED ORDER — LOPERAMIDE HCL 2 MG PO CAPS: 2.0000 mg | ORAL_CAPSULE | ORAL | Status: AC | PRN

## 2018-10-03 MED ORDER — TRAZODONE HCL 50 MG PO TABS
50.0000 mg | ORAL_TABLET | Freq: Every evening | ORAL | Status: DC | PRN
  Administered 2018-10-03: 50 mg via ORAL
  Filled 2018-10-03: qty 1

## 2018-10-03 MED ORDER — ACETAMINOPHEN 325 MG PO TABS
650.0000 mg | ORAL_TABLET | Freq: Four times a day (QID) | ORAL | Status: DC | PRN
  Administered 2018-10-03 – 2018-10-08 (×9): 650 mg via ORAL
  Filled 2018-10-03 (×9): qty 2

## 2018-10-03 MED ORDER — CHLORDIAZEPOXIDE HCL 25 MG PO CAPS
25.0000 mg | ORAL_CAPSULE | Freq: Four times a day (QID) | ORAL | Status: DC | PRN
  Administered 2018-10-03: 25 mg via ORAL
  Filled 2018-10-03: qty 1

## 2018-10-03 NOTE — Progress Notes (Signed)
D: Pt was in bed in his room upon initial approach.  Pt presents with anxious, depressed affect and mood.  He reports he is "just tired."  Pt denies HI, denies hallucinations.  He reports SI with plan to "go out in front of a truck."  Pt verbally contracts for safety.  He complains of withdrawal symptoms of anxiety, tremor, "itching."  Pt has been visible in milieu at times with few peer interactions.  Pt's gait is slow and he is uses walker for mobility.  A: Introduced self to pt.  Met with pt 1:1.  Actively listened to pt and offered support and encouragement.  PRN medication administered for withdrawal and sleep.  Fall prevention techniques reviewed with pt and he verbalized understanding.  Q15 minute safety checks maintained.  R: Pt is safe on the unit.  Pt is compliant with medications.  Pt verbally contracts for safety.  Will continue to monitor and assess.

## 2018-10-03 NOTE — Tx Team (Signed)
Initial Treatment Plan 10/03/2018 7:46 PM Samuel Reyes JIR:[id number]    PATIENT STRESSORS: Financial difficulties Health problems Loss of wife/son Substance abuse Traumatic event   PATIENT STRENGTHS: Ability for insight Active sense of humor Average or above average intelligence Communication skills Motivation for treatment/growth   PATIENT IDENTIFIED PROBLEMS: "stop drinking"  "work on my depression"                   DISCHARGE CRITERIA:  Ability to meet basic life and health needs Adequate post-discharge living arrangements Improved stabilization in mood, thinking, and/or behavior Medical problems require only outpatient monitoring  PRELIMINARY DISCHARGE PLAN: Attend aftercare/continuing care group Attend 12-step recovery group Placement in alternative living arrangements  PATIENT/FAMILY INVOLVEMENT: This treatment plan has been presented to and reviewed with the patient, Samuel Reyes.  The patient and family have been given the opportunity to ask questions and make suggestions.  Baron Sane, RN 10/03/2018, 7:46 PM

## 2018-10-03 NOTE — BH Assessment (Signed)
[facility] Assessment Progress Note  Per Buford Dresser, DO, this pt requires psychiatric hospitalization at this time.  Letitia Libra, RN, [facility] has assigned pt to [facility] Rm 302-2.  Pt has signed Voluntary Admission and Consent for Treatment, as well as Consent to Release Information to pt's mother, and signed forms have been faxed to [facility].  Pt's nurse has been notified, and agrees to send original paperwork along with pt via [facility], and to call report to [phone].  Jalene Mullet, [facility] Coordinator [phone]

## 2018-10-03 NOTE — ED Notes (Addendum)
Report given to Legrand Como, RN at [facility]
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
## 2018-10-03 NOTE — [facility] Counselor (Signed)
Pt referred as been sent to the following facilities:   [facility]. [facility], [facility], [facility], [facility] Triage Specialist [phone]

## 2018-10-04 DIAGNOSIS — Z7289 Other problems related to lifestyle: Secondary | ICD-10-CM

## 2018-10-04 MED ORDER — MOMETASONE FURO-FORMOTEROL FUM 100-5 MCG/ACT IN AERO: 2.0000 | INHALATION_SPRAY | Freq: Two times a day (BID) | RESPIRATORY_TRACT | Status: DC

## 2018-10-04 MED ORDER — TRAZODONE HCL 100 MG PO TABS
100.0000 mg | ORAL_TABLET | Freq: Every evening | ORAL | Status: DC | PRN
  Administered 2018-10-04 – 2018-10-05 (×4): 100 mg via ORAL
  Filled 2018-10-04 (×4): qty 1

## 2018-10-04 MED ORDER — PANTOPRAZOLE SODIUM 40 MG PO TBEC
40.0000 mg | DELAYED_RELEASE_TABLET | Freq: Every day | ORAL | Status: DC
  Administered 2018-10-04 – 2018-10-08 (×5): 40 mg via ORAL
  Filled 2018-10-04 (×7): qty 1

## 2018-10-04 MED ORDER — TIOTROPIUM BROMIDE MONOHYDRATE 18 MCG IN CAPS
18.0000 ug | ORAL_CAPSULE | Freq: Every day | RESPIRATORY_TRACT | Status: DC
  Administered 2018-10-04 – 2018-10-08 (×5): 18 ug via RESPIRATORY_TRACT
  Filled 2018-10-04: qty 5

## 2018-10-04 MED ORDER — TRAZODONE HCL 50 MG PO TABS
50.0000 mg | ORAL_TABLET | Freq: Every evening | ORAL | Status: DC | PRN
  Administered 2018-10-04: 50 mg via ORAL
  Filled 2018-10-04: qty 1

## 2018-10-04 MED ORDER — POLYETHYLENE GLYCOL 3350 17 G PO PACK
17.0000 g | PACK | Freq: Every day | ORAL | Status: DC
  Administered 2018-10-06: 17 g via ORAL
  Filled 2018-10-04 (×7): qty 1

## 2018-10-04 MED ORDER — SERTRALINE HCL 25 MG PO TABS
25.0000 mg | ORAL_TABLET | Freq: Every day | ORAL | Status: DC
  Administered 2018-10-04 – 2018-10-07 (×4): 25 mg via ORAL
  Filled 2018-10-04 (×5): qty 1

## 2018-10-04 MED ORDER — ALBUTEROL SULFATE HFA 108 (90 BASE) MCG/ACT IN AERS: 1.0000 | INHALATION_SPRAY | Freq: Four times a day (QID) | RESPIRATORY_TRACT | Status: DC | PRN

## 2018-10-04 MED ORDER — SENNOSIDES-DOCUSATE SODIUM 8.6-50 MG PO TABS: 1.0000 | ORAL_TABLET | Freq: Every evening | ORAL | Status: DC | PRN

## 2018-10-04 MED ORDER — FOLIC ACID 1 MG PO TABS
1.0000 mg | ORAL_TABLET | Freq: Every day | ORAL | Status: DC
  Administered 2018-10-04 – 2018-10-08 (×5): 1 mg via ORAL
  Filled 2018-10-04 (×7): qty 1

## 2018-10-04 NOTE — Progress Notes (Signed)
D Pt is observed OOB UAL on the 300 hall today, he tolerates this fair. He uses a rolling walker for stabilization as he ambulates and he says this is necessary at his home - that he uses a cane ( canes are not allowed in this facility).      A He endorses a flat, depressed affect. HE makes minimal eye contact with this Probation officer. He says over and over to this writer " Have you got anything you can give me..I want everything I can have". He completed his daily assessment and on this he wrote he has experienced SI today but he is willing to contract verbally with Probation officer, to not hurt himself. He rated his depression, hopelessness and anxiety "  /9/6" , respectively.      R Safety is in place.

## 2018-10-04 NOTE — Progress Notes (Signed)
D: Pt was in dayroom upon initial approach.  Pt presents with anxious, depressed affect and mood.  He reports his day was "all right, not too bad."  His goal is to "figure out where I'm going to go live" after he leaves [facility].  Pt was encouraged to discuss potential aftercare options with Education officer, museum.  Pt denies SI/HI, denies hallucinations.  Pt has been visible in milieu interacting with peers and staff appropriately.  Pt attended evening group.    A: Introduced self to pt.  Met with pt 1:1.  Actively listened to pt and offered support and encouragement.  PRN medication administered for anxiety and sleep.  Q15 minute safety checks maintained.  R: Pt is safe on the unit.  Pt is compliant with medications.  Pt verbally contracts for safety.  Will continue to monitor and assess.

## 2018-10-04 NOTE — Plan of Care (Signed)
  Problem: Education: Goal: Knowledge of [facility] Education information/materials will improve Outcome: Progressing Goal: Emotional status will improve Outcome: Progressing   

## 2018-10-04 NOTE — Plan of Care (Signed)
  Problem: Coping: Goal: Ability to demonstrate self-control will improve Outcome: Progressing Note:  Pt has maintained control of his behavior tonight.

## 2018-10-04 NOTE — Progress Notes (Signed)
Patient did attend the evening speaker AA meeting.  

## 2018-10-04 NOTE — H&P (Signed)
Psychiatric Admission Assessment Adult  Patient Identification: Samuel Reyes MRN:  [id number] Date of Evaluation:  10/04/2018 Chief Complaint:  MDD ALCOHOL USE DISORDER Principal Diagnosis: <principal problem not specified> Diagnosis:  Active Problems:   MDD (major depressive disorder), recurrent severe, without psychosis ([place])  History of Present Illness: Patient is seen and examined.  Patient is a 63 year old male with a past psychiatric history significant for alcohol dependence, depression, chronic pain, COPD who presented to the [facility] emergency department on 10/02/2018 with suicidal ideation.  Patient stated that he had stepped in front of a car, and "the guy braked, I have bad luck".  The patient has a long history of psychiatric admissions secondary to alcohol and depression.  He has apparently attempted to kill himself on 3 occasions.  He attempted suicide by overdosing a few weeks ago, and a cut himself 3 years ago.  He stated he is homeless, but that his sister lives in [place] and she has weapons in the home.  His last admission to our facility was on 07/18/2018.  At that time he was discharged to the [facility] residential program.  He was being treated with Celexa for depression as well.  His medical conditions include coronary artery disease as well as COPD.  He admitted to suicidal ideation currently.  Associated Signs/Symptoms: Depression Symptoms:  depressed mood, anhedonia, insomnia, psychomotor agitation, fatigue, feelings of worthlessness/guilt, difficulty concentrating, hopelessness, suicidal thoughts without plan, suicidal attempt, anxiety, panic attacks, loss of energy/fatigue, disturbed sleep, weight loss, (Hypo) Manic Symptoms:  Impulsivity, Anxiety Symptoms:  Excessive Worry, Psychotic Symptoms:  denied PTSD Symptoms: Negative Total Time spent with patient: 30 minutes  Past Psychiatric History: Patient has had multiple psychiatric  hospitalizations at this facility.  He has been admitted 3 times in the last several months.  His last admission directly to our facility was on 07/18/2018.  He was readmitted on 09/06/2018 after an intentional overdose.  Most of his psychiatric hospitalizations involve alcohol.  Is the patient at risk to self? Yes.    Has the patient been a risk to self in the past 6 months? Yes.    Has the patient been a risk to self within the distant past? Yes.    Is the patient a risk to others? No.  Has the patient been a risk to others in the past 6 months? No.  Has the patient been a risk to others within the distant past? No.   Prior Inpatient Therapy:   Prior Outpatient Therapy:    Alcohol Screening: 1. How often do you have a drink containing alcohol?: 4 or more times a week 2. How many drinks containing alcohol do you have on a typical day when you are drinking?: 10 or more 3. How often do you have six or more drinks on one occasion?: Daily or almost daily AUDIT-C Score: 12 4. How often during the last year have you found that you were not able to stop drinking once you had started?: Daily or almost daily 5. How often during the last year have you failed to do what was normally expected from you becasue of drinking?: Daily or almost daily 6. How often during the last year have you needed a first drink in the morning to get yourself going after a heavy drinking session?: Daily or almost daily 7. How often during the last year have you had a feeling of guilt of remorse after drinking?: Daily or almost daily 8. How often during the last year  have you been unable to remember what happened the night before because you had been drinking?: Daily or almost daily 9. Have you or someone else been injured as a result of your drinking?: Yes, during the last year 10. Has a relative or friend or a doctor or another health worker been concerned about your drinking or suggested you cut down?: Yes, during the last  year Alcohol Use Disorder Identification Test Final Score (AUDIT): 40 Alcohol Brief Interventions/Follow-up: Alcohol Education Substance Abuse History in the last 12 months:  Yes.   Consequences of Substance Abuse: Withdrawal Symptoms:   Nausea Tremors Vomiting Previous Psychotropic Medications: Yes  Psychological Evaluations: Yes  Past Medical History:  Past Medical History:  Diagnosis Date  . Alcoholism ([place])   . CAD (coronary artery disease) 2002   MI, no intervention required  . COPD (chronic obstructive pulmonary disease) (HCC)    not on home O2  . Depression   . GERD (gastroesophageal reflux disease)   . Headache   . Hypertension   . Stroke [facility]) 2006    Past Surgical History:  Procedure Laterality Date  . BACK SURGERY     3 cervical spine surgeries C4-C5 fused  . COLONOSCOPY N/A 01/04/2014   Procedure: COLONOSCOPY;  Surgeon: Danie Binder, MD;  Location: [facility];  Service: Endoscopy;  Laterality: N/A;  1:45  . ESOPHAGOGASTRODUODENOSCOPY N/A 01/04/2014   Procedure: ESOPHAGOGASTRODUODENOSCOPY (EGD);  Surgeon: Danie Binder, MD;  Location: [facility];  Service: Endoscopy;  Laterality: N/A;  . FINGER SURGERY Left    2nd, 3rd, & 4th fingers were cut off by table saw and reattached  . GASTRECTOMY    . HERNIA REPAIR    . INCISIONAL HERNIA REPAIR N/A 01/20/2014   Procedure: LAPAROSCOPIC RECURRENT  INCISIONAL HERNIA with mesh;  Surgeon: Edward Jolly, MD;  Location: [facility]S;  Service: General;  Laterality: N/A;  . rt knee arthroscopic surgery    . SHOULDER SURGERY Bilateral    3 surgeries on on left, 2 surgeries on right    Family History:  Family History  Problem Relation Age of Onset  . Cancer Father        bone  . Cancer Brother        lungs  . Stroke Maternal Grandmother   . Asthma Son        died at age 80 in his sleep   . Spina bifida Son        died at age 78   . Dementia Mother   . Colon cancer Neg Hx    Family Psychiatric  History:  Denied Tobacco Screening:   Social History:  Social History   Substance and Sexual Activity  Alcohol Use Yes  . Alcohol/week: 3.0 - 4.0 standard drinks  . Types: 3 - 4 Cans of beer per week   Comment: 2 cases of beer     Social History   Substance and Sexual Activity  Drug Use No   Comment: denied using any drugs    Additional Social History:                           Allergies:   Allergies  Allergen Reactions  . Bee Venom Anaphylaxis  . Penicillins Rash    Has patient had a PCN reaction causing immediate rash, facial/tongue/throat swelling, SOB or lightheadedness with hypotension: {Yes Has patient had a PCN reaction causing severe rash involving mucus membranes or skin  necrosis: YES Has patient had a PCN reaction that required hospitalization Yes Has patient had a PCN reaction occurring within the last 10 years: YES If all of the above answers are "NO", then may proceed with Cephalosporin use.   . Vancomycin Tinitus   Lab Results:  Results for orders placed or performed during the hospital encounter of 10/02/18 (from the past 48 hour(s))  Urinalysis, Routine w reflex microscopic     Status: None   Collection Time: 10/02/18 10:09 PM  Result Value Ref Range   Color, Urine YELLOW YELLOW   APPearance CLEAR CLEAR   Specific Gravity, Urine 1.009 1.005 - 1.030   pH 5.0 5.0 - 8.0   Glucose, UA NEGATIVE NEGATIVE mg/dL   Hgb urine dipstick NEGATIVE NEGATIVE   Bilirubin Urine NEGATIVE NEGATIVE   Ketones, ur NEGATIVE NEGATIVE mg/dL   Protein, ur NEGATIVE NEGATIVE mg/dL   Nitrite NEGATIVE NEGATIVE   Leukocytes,Ua NEGATIVE NEGATIVE    Comment: Performed at [facility] [address], [place], [place] [address]    Blood Alcohol level:  Lab Results  Component Value Date   ETH <10 09/06/2018   ETH 352 (HH) 97/08/6376    Metabolic Disorder Labs:  Lab Results  Component Value Date   HGBA1C 5.4 06/26/2017   MPG 108 06/26/2017   MPG 105  08/03/2016   No results found for: PROLACTIN Lab Results  Component Value Date   CHOL 247 (H) 02/17/2013   TRIG 144 02/17/2013   HDL 79 02/17/2013   CHOLHDL 3.1 02/17/2013   VLDL 29 02/17/2013   LDLCALC 139 (H) 02/17/2013    Current Medications: Current Facility-Administered Medications  Medication Dose Route Frequency Provider Last Rate Last Dose  . acetaminophen (TYLENOL) tablet 650 mg  650 mg Oral Q6H PRN Ethelene Hal, NP   650 mg at 10/04/18 5885  . albuterol (PROVENTIL HFA;VENTOLIN HFA) 108 (90 Base) MCG/ACT inhaler 1-2 puff  1-2 puff Inhalation Q6H PRN Sharma Covert, MD      . alum & mag hydroxide-simeth (MAALOX/MYLANTA) 200-200-20 MG/5ML suspension 30 mL  30 mL Oral Q4H PRN Ethelene Hal, NP      . chlordiazePOXIDE (LIBRIUM) capsule 25 mg  25 mg Oral Q6H PRN Lindon Romp A, NP   25 mg at 02/77/41 2878  . folic acid (FOLVITE) tablet 1 mg  1 mg Oral Daily Sharma Covert, MD   1 mg at 10/04/18 1205  . hydrOXYzine (ATARAX/VISTARIL) tablet 25 mg  25 mg Oral TID PRN Ethelene Hal, NP   25 mg at 10/04/18 0734  . loperamide (IMODIUM) capsule 2-4 mg  2-4 mg Oral PRN Lindon Romp A, NP      . magnesium hydroxide (MILK OF MAGNESIA) suspension 30 mL  30 mL Oral Daily PRN Ethelene Hal, NP      . multivitamin with minerals tablet 1 tablet  1 tablet Oral Daily Lindon Romp A, NP   1 tablet at 10/04/18 0734  . nicotine (NICODERM CQ - dosed in mg/24 hours) patch 21 mg  21 mg Transdermal Daily Cobos, Myer Peer, MD   21 mg at 10/04/18 0733  . ondansetron (ZOFRAN-ODT) disintegrating tablet 4 mg  4 mg Oral Q6H PRN Lindon Romp A, NP      . pantoprazole (PROTONIX) EC tablet 40 mg  40 mg Oral Daily Sharma Covert, MD   40 mg at 10/04/18 1205  . polyethylene glycol (MIRALAX / GLYCOLAX) packet 17 g  17 g Oral  Daily Sharma Covert, MD      . senna-docusate (Senokot-S) tablet 1 tablet  1 tablet Oral QHS PRN Sharma Covert, MD      . sertraline  (ZOLOFT) tablet 25 mg  25 mg Oral Daily Sharma Covert, MD   25 mg at 10/04/18 1205  . thiamine (VITAMIN B-1) tablet 100 mg  100 mg Oral Daily Lindon Romp A, NP   100 mg at 10/04/18 0734  . tiotropium (SPIRIVA) inhalation capsule ([facility] use ONLY) 18 mcg  18 mcg Inhalation Daily Sharma Covert, MD   18 mcg at 10/04/18 1204  . traZODone (DESYREL) tablet 100 mg  100 mg Oral QHS PRN,MR X 1 Avonda Toso, Cordie Grice, MD       PTA Medications: Medications Prior to Admission  Medication Sig Dispense Refill Last Dose  . albuterol (PROVENTIL HFA;VENTOLIN HFA) 108 (90 Base) MCG/ACT inhaler Inhale 2 puffs into the lungs every 4 (four) hours as needed for wheezing or shortness of breath.   Past Week at Unknown time  . Aspirin-Acetaminophen-Caffeine (GOODYS EXTRA STRENGTH) 500-325-65 MG PACK Take 4 Packages by mouth every 6 (six) hours as needed (for pain).   10/02/2018 at Unknown time  . citalopram (CELEXA) 20 MG tablet Take 1 tablet (20 mg total) by mouth daily. (Patient not taking: Reported on 10/03/2018) 30 tablet 0 Not Taking at Unknown time  . fluticasone (FLONASE) 50 MCG/ACT nasal spray Place 2 sprays into both nostrils daily. (Patient not taking: Reported on 10/03/2018) 16 g 2 Not Taking at Unknown time  . folic acid (FOLVITE) 1 MG tablet Take 1 mg by mouth daily.   Past Month at Unknown time  . gabapentin (NEURONTIN) 300 MG capsule Take 300 mg by mouth 3 (three) times daily.   Past Month at Unknown time  . loratadine (CLARITIN) 10 MG tablet Take 1 tablet (10 mg total) by mouth daily. (Patient not taking: Reported on 10/03/2018) 30 tablet 0 Not Taking at Unknown time  . mometasone-formoterol (DULERA) 100-5 MCG/ACT AERO Inhale 2 puffs into the lungs daily. (Patient not taking: Reported on 10/03/2018) 1 Inhaler 0 Not Taking at Unknown time  . Multiple Vitamin (MULTIVITAMIN WITH MINERALS) TABS tablet Take 1 tablet by mouth daily. Vitamin supplementation (Patient not taking: Reported on 09/06/2018)   Not Taking at  Unknown time  . polyethylene glycol (MIRALAX) packet Take 17 g by mouth daily as needed for moderate constipation. (Patient not taking: Reported on 10/03/2018) 14 each 0 Not Taking at Unknown time  . propranolol (INDERAL) 10 MG tablet Take 1 tablet (10 mg total) by mouth 3 (three) times daily. For anxiety (Patient not taking: Reported on 10/03/2018) 90 tablet 0 Not Taking at Unknown time  . senna-docusate (SENOKOT-S) 8.6-50 MG tablet Take 1 tablet by mouth at bedtime. (Patient not taking: Reported on 10/03/2018) 30 tablet 0 Not Taking at Unknown time  . thiamine 100 MG tablet Take 1 tablet (100 mg total) by mouth daily. (Patient not taking: Reported on 10/03/2018) 30 tablet 3 Not Taking at Unknown time  . tiotropium (SPIRIVA) 18 MCG inhalation capsule Place 1 capsule (18 mcg total) into inhaler and inhale daily. For COPD 30 capsule 12 Past Month at Unknown time  . traZODone (DESYREL) 100 MG tablet Take 1 tablet (100 mg total) by mouth at bedtime as needed for sleep. (Patient not taking: Reported on 10/03/2018) 30 tablet 0 Not Taking at Unknown time  . traZODone (DESYREL) 150 MG tablet Take 150 mg by mouth at bedtime  as needed for sleep.   Past Month at Unknown time    Musculoskeletal: Strength & Muscle Tone: decreased Gait & Station: broad based Patient leans: N/A  Psychiatric Specialty Exam: Physical Exam  Nursing note and vitals reviewed. Constitutional: He is oriented to person, place, and time. He appears well-developed and well-nourished.  HENT:  Head: Normocephalic and atraumatic.  Respiratory: Effort normal.  Neurological: He is alert and oriented to person, place, and time.    ROS  Blood pressure 133/87, pulse (!) 106, temperature 98.3 F (36.8 C), temperature source Oral, resp. rate 18, height 5\' 5"  (1.651 m), weight 68 kg, SpO2 98 %.Body mass index is 24.96 kg/m.  General Appearance: Disheveled  Eye Contact:  Fair  Speech:  Normal Rate  Volume:  Normal  Mood:  Dysphoric  Affect:   Congruent  Thought Process:  Coherent and Descriptions of Associations: Circumstantial  Orientation:  Full (Time, Place, and Person)  Thought Content:  Logical  Suicidal Thoughts:  Yes.  without intent/plan  Homicidal Thoughts:  No  Memory:  Immediate;   Fair Recent;   Fair Remote;   Fair  Judgement:  Impaired  Insight:  Lacking  Psychomotor Activity:  Increased  Concentration:  Concentration: Poor and Attention Span: Poor  Recall:  Poor  Fund of Knowledge:  Fair  Language:  Fair  Akathisia:  Negative  Handed:  Right  AIMS (if indicated):     Assets:  Desire for Improvement Resilience  ADL's:  Impaired  Cognition:  WNL  Sleep:  Number of Hours: 4    Treatment Plan Summary: Daily contact with patient to assess and evaluate symptoms and progress in treatment, Medication management and Plan : Patient is seen and examined.  Patient is a 63 year old male with the above-stated past psychiatric history who was admitted to the psychiatric hospital secondary to suicidal ideation.  He also has a recent intentional overdose on 09/06/2018.  He had planned on walking in front of traffic but "they stopped the damn car".  He will be admitted to the hospital.  He will be integrated into the milieu.  He will be encouraged to attend groups.  He will be placed on a Librium protocol where he will receive 25 mg p.o. every 6 hours as needed a CIWA greater than 10.  He has a history of a prolonged QTC, and I will place him on Zoloft 25 mg p.o. daily for depression.  He will also be placed on thiamine and folic acid.  We will get an EKG with his medications.  His COPD medications which he received on last admission will be restarted.  He also has a history of constipation and he will have MiraLAX as well as Senokot available for him.  He will also receive a multivitamin.  His blood alcohol on admission was negative.  His liver function enzymes were normal.  His MCV was normal.  Drug screen was positive for  benzodiazepines.  He will meet with social work and discuss housing alternatives as well as residential substance abuse treatment programs.  Observation Level/Precautions:  Detox 15 minute checks  Laboratory:  Chemistry Profile  Psychotherapy:    Medications:    Consultations:    Discharge Concerns:    Estimated LOS:  Other:     Physician Treatment Plan for Primary Diagnosis: <principal problem not specified> Long Term Goal(s): Improvement in symptoms so as ready for discharge  Short Term Goals: Ability to identify changes in lifestyle to reduce recurrence of condition will improve,  Ability to verbalize feelings will improve, Ability to disclose and discuss suicidal ideas, Ability to demonstrate self-control will improve, Ability to identify and develop effective coping behaviors will improve, Ability to maintain clinical measurements within normal limits will improve, Compliance with prescribed medications will improve and Ability to identify triggers associated with substance abuse/mental health issues will improve  Physician Treatment Plan for Secondary Diagnosis: Active Problems:   MDD (major depressive disorder), recurrent severe, without psychosis ([place])  Long Term Goal(s): Improvement in symptoms so as ready for discharge  Short Term Goals: Ability to identify changes in lifestyle to reduce recurrence of condition will improve, Ability to verbalize feelings will improve, Ability to disclose and discuss suicidal ideas, Ability to demonstrate self-control will improve, Ability to identify and develop effective coping behaviors will improve, Ability to maintain clinical measurements within normal limits will improve, Compliance with prescribed medications will improve and Ability to identify triggers associated with substance abuse/mental health issues will improve  I certify that inpatient services furnished can reasonably be expected to improve the patient's condition.    Sharma Covert, MD 3/7/202012:55 PM

## 2018-10-04 NOTE — [facility] Suicide Risk Assessment (Signed)
[facility] Admission Suicide Risk Assessment   Nursing information obtained from:  Patient Demographic factors:  Male, Divorced or widowed, Living alone, Caucasian, Unemployed, Low socioeconomic status Current Mental Status:  Suicidal ideation indicated by patient, Suicide plan, Self-harm thoughts, Intention to act on suicide plan, Plan includes specific time, place, or method, Self-harm behaviors, Belief that plan would result in death Loss Factors:  Decrease in vocational status, Financial problems / change in socioeconomic status, Decline in physical health Historical Factors:  Prior suicide attempts, Anniversary of important loss, Impulsivity Risk Reduction Factors:  Positive coping skills or problem solving skills, Positive therapeutic relationship  Total Time spent with patient: 20 minutes Principal Problem: <principal problem not specified> Diagnosis:  Active Problems:   MDD (major depressive disorder), recurrent severe, without psychosis ([place])  Subjective Data: Patient is seen and examined.  Patient is a 63 year old male with a past psychiatric history significant for alcohol dependence, depression, chronic pain, COPD who presented to the [facility] emergency department on 10/02/2018 with suicidal ideation.  Patient stated that he had stepped in front of a car, and "the guy braked, I have bad luck".  The patient has a long history of psychiatric admissions secondary to alcohol and depression.  He has apparently attempted to kill himself on 3 occasions.  He attempted suicide by overdosing a few weeks ago, and a cut himself 3 years ago.  He stated he is homeless, but that his sister lives in [place] and she has weapons in the home.  His last admission to our facility was on 07/18/2018.  At that time he was discharged to the [facility] residential program.  He was being treated with Celexa for depression as well.  His medical conditions include coronary artery disease as well as COPD.   He admitted to suicidal ideation currently.  Continued Clinical Symptoms:  Alcohol Use Disorder Identification Test Final Score (AUDIT): 40 The "Alcohol Use Disorders Identification Test", Guidelines for Use in Primary Care, Second Edition.  World Pharmacologist [facility]). Score between 0-7:  no or low risk or alcohol related problems. Score between 8-15:  moderate risk of alcohol related problems. Score between 16-19:  high risk of alcohol related problems. Score 20 or above:  warrants further diagnostic evaluation for alcohol dependence and treatment.   CLINICAL FACTORS:   Depression:   Anhedonia Comorbid alcohol abuse/dependence Hopelessness Impulsivity Insomnia Alcohol/Substance Abuse/Dependencies   Musculoskeletal: Strength & Muscle Tone: decreased Gait & Station: unsteady Patient leans: N/A  Psychiatric Specialty Exam: Physical Exam  Nursing note and vitals reviewed. Constitutional: He is oriented to person, place, and time. He appears well-developed and well-nourished.  HENT:  Head: Normocephalic and atraumatic.  Respiratory: Effort normal.  Neurological: He is alert and oriented to person, place, and time.    ROS  Blood pressure 133/87, pulse (!) 106, temperature 98.3 F (36.8 C), temperature source Oral, resp. rate 18, height 5\' 5"  (1.651 m), weight 68 kg, SpO2 98 %.Body mass index is 24.96 kg/m.  General Appearance: Disheveled  Eye Contact:  Fair  Speech:  Normal Rate  Volume:  Normal  Mood:  Dysphoric  Affect:  Congruent  Thought Process:  Coherent and Descriptions of Associations: Circumstantial  Orientation:  Full (Time, Place, and Person)  Thought Content:  Logical  Suicidal Thoughts:  Yes.  without intent/plan  Homicidal Thoughts:  No  Memory:  Immediate;   Fair Recent;   Fair Remote;   Fair  Judgement:  Impaired  Insight:  Lacking  Psychomotor Activity:  Increased  Concentration:  Concentration: Fair and Attention Span: Fair  Recall:  [employer] of Knowledge:  Fair  Language:  Fair  Akathisia:  Negative  Handed:  Right  AIMS (if indicated):     Assets:  Desire for Improvement Resilience  ADL's:  Intact  Cognition:  WNL  Sleep:  Number of Hours: 4      COGNITIVE FEATURES THAT CONTRIBUTE TO RISK:  None    SUICIDE RISK:   Mild:  Suicidal ideation of limited frequency, intensity, duration, and specificity.  There are no identifiable plans, no associated intent, mild dysphoria and related symptoms, good self-control (both objective and subjective assessment), few other risk factors, and identifiable protective factors, including available and accessible social support.  PLAN OF CARE: Patient is seen and examined.  Patient is a 63 year old male with the above-stated past psychiatric history who was admitted to the psychiatric hospital secondary to suicidal ideation.  He also has a recent intentional overdose on 09/06/2018.  He had planned on walking in front of traffic but "they stopped the damn car".  He will be admitted to the hospital.  He will be integrated into the milieu.  He will be encouraged to attend groups.  He will be placed on a Librium protocol where he will receive 25 mg p.o. every 6 hours as needed a CIWA greater than 10.  He has a history of a prolonged QTC, and I will place him on Zoloft 25 mg p.o. daily for depression.  He will also be placed on thiamine and folic acid.  We will get an EKG with his medications.  His COPD medications which he received on last admission will be restarted.  He also has a history of constipation and he will have MiraLAX as well as Senokot available for him.  He will also receive a multivitamin.  His blood alcohol on admission was negative.  His liver function enzymes were normal.  His MCV was normal.  Drug screen was positive for benzodiazepines.  He will meet with social work and discuss housing alternatives as well as residential substance abuse treatment programs.  I certify that  inpatient services furnished can reasonably be expected to improve the patient's condition.   Sharma Covert, MD 10/04/2018, 9:31 AM

## 2018-10-04 NOTE — [facility] Group Notes (Signed)
LCSW Group Therapy Note  10/04/2018   10:00-11:00am   Type of Therapy and Topic:  Group Therapy: Anger Cues and Responses  Participation Level:  Active   Description of Group:   In this group, patients learned how to recognize the physical, cognitive, emotional, and behavioral responses they have to anger-provoking situations.  They identified a recent time they became angry and how they reacted.  They analyzed how their reaction was possibly beneficial and how it was possibly unhelpful.  The group discussed a variety of healthier coping skills that could help with such a situation in the future.  An emphasis was placed on medication management, therapy and support groups.  Therapeutic Goals: 1. Patients will remember their last incident of anger and how they felt emotionally and physically, what their thoughts were at the time, and how they behaved. 2. Patients will identify how their behavior at that time worked for them, as well as how it worked against them. 3. Patients will explore possible new behaviors to use in future anger situations. 4. Patients will learn that anger itself is normal and cannot be eliminated, and that healthier reactions can assist with resolving conflict rather than worsening situations.  Summary of Patient Progress:  Patient was present and engaged in discussion. Patient reported wanting to get help for his grief. Patient shared "I just want to drink when I am angry". Patient was able to share with the group the importance of taking a minute to clear your head, stop and think before reacting.   Therapeutic Modalities:   Cognitive Behavioral Therapy  Tye Savoy

## 2018-10-05 NOTE — [facility] Group Notes (Addendum)
LCSW Group Therapy Note   10/05/2018 1:15pm   Type of Therapy and Topic:  Group Therapy:  Overcoming Obstacles   Participation Level:  Active   Description of Group:    In this group patients will be encouraged to explore what they see as obstacles to their own wellness and recovery. They will be guided to discuss their thoughts, feelings, and behaviors related to these obstacles. The group will process together ways to cope with barriers, with attention given to specific choices patients can make. Each patient will be challenged to identify changes they are motivated to make in order to overcome their obstacles. This group will be process-oriented, with patients participating in exploration of their own experiences as well as giving and receiving support and challenge from other group members.   Therapeutic Goals: 1. Patient will identify personal and current obstacles as they relate to admission. 2. Patient will identify barriers that currently interfere with their wellness or overcoming obstacles.  3. Patient will identify feelings, thought process and behaviors related to these barriers. 4. Patient will identify two changes they are willing to make to overcome these obstacles:      Summary of Patient Progress   Samuel Reyes was attentive and engaged during today's processing group. He shared that he is currently homeless and drinking heavily, which causes medical issues and SI thoughts. Samuel Reyes is hoping to get into a shelter or an [facility] at discharge. He receives disability and retirement income but lost his bank card. Pt given information to Agustina Caroli, hospice and grief counseling information, eye doctor information, and [facility] and wellness referrals. He is hoping to start going back to appts and to get himself healthy. Samuel Reyes continues to demonstrate improving insight and progress in the group setting.    Therapeutic Modalities:   Cognitive Behavioral Therapy Solution  Focused Therapy Motivational Interviewing Relapse Prevention Therapy  Avelina Laine, LCSW 10/05/2018 11:27 AM

## 2018-10-05 NOTE — [facility] Suicide Risk Assessment (Signed)
[facility] INPATIENT:  Family/Significant Other Suicide Prevention Education  Suicide Prevention Education:  Education Completed; Orion Crook (pt's mother) [phone] has been identified by the patient as the family member/significant other with whom the patient will be residing, and identified as the person(s) who will aid the patient in the event of a mental health crisis (suicidal ideations/suicide attempt).  With written consent from the patient, the family member/significant other has been provided the following suicide prevention education, prior to the and/or following the discharge of the patient.  The suicide prevention education provided includes the following:  Suicide risk factors  Suicide prevention and interventions  National Suicide Hotline telephone number  [facility] assessment telephone number  [facility] Emergency Assistance [place] and/or Residential Mobile Crisis Unit telephone number  Request made of family/significant other to:  Remove weapons (e.g., guns, rifles, knives), all items previously/currently identified as safety concern.    Remove drugs/medications (over-the-counter, prescriptions, illicit drugs), all items previously/currently identified as a safety concern.  The family member/significant other verbalizes understanding of the suicide prevention education information provided.  The family member/significant other agrees to remove the items of safety concern listed above.  Avelina Laine LCSW 10/05/2018, 1:33 PM

## 2018-10-05 NOTE — Progress Notes (Signed)
[facility] MD Progress Note  10/05/2018 11:53 AM Samuel Reyes  MRN:  [id number]   Subjective: " Feeling a little better because I am here but really worried about where I will go once I leave."  Objective: Face to face evaluation completed and chart reviewed. Patient is a 63 year old male with a past psychiatric history significant for alcohol dependence, depression, chronic pain, COPD who presented to the [facility] emergency department on 10/02/2018 with suicidal ideation. Patient stated that he had stepped in front of a car, and "the guy braked, I have bad luck". The patient has a long history of psychiatric admissions secondary to alcohol and depression. He has apparently attempted to kill himself on 3 occasions. He attempted suicide by overdosing a few weeks ago, and a cut himself 3 years ago. He stated he is homeless, but that his sister lives in [place].  Today, patient is alert and oriented x3, plesant on approached and cooperative. He repots he is depressed which is not improved. His depression, he reports, is mostly related to homelessness and he voices concerns about discharge and not having a stable environment to return to. [place]. He has multiple admission to [facility] with similar complaints along with history of substance abuse. He reports he is having withdrawal symptoms describing symptoms as tremors, diarrhea, skin crawling and cravings. He endorses significant hopelessness along with his depression. Reports he is open to inpatient treatment for his substance abuse following discharge. He denies any SI, HI or AVH.  He I active for unit activities, engaging well with peers and staff, and had no increased irritability or agitation during his participation in unit milieu. He reports no side effects to current medication as noted below. Reports no concerns with sleeping pattern or appetite. He reports multiple losses (death  of two sons and wife) and he is open to participation in   grief counseling. He is contracting for safety on the unit   Principal Problem: <principal problem not specified> Diagnosis: Active Problems:   MDD (major depressive disorder), recurrent severe, without psychosis ([place])  Total Time spent with patient: 20 minutes  Past Psychiatric History:  Patient has had multiple psychiatric hospitalizations at this facility.  He has been admitted 3 times in the last several months.  His last admission directly to our facility was on 07/18/2018.  He was readmitted on 09/06/2018 after an intentional overdose.  Most of his psychiatric hospitalizations involve alcohol.  Past Medical History:  Past Medical History:  Diagnosis Date  . Alcoholism ([place])   . CAD (coronary artery disease) 2002   MI, no intervention required  . COPD (chronic obstructive pulmonary disease) ([facility])    not on home O2  . Depression   . GERD (gastroesophageal reflux disease)   . Headache   . Hypertension   . Stroke [facility]) 2006    Past Surgical History:  Procedure Laterality Date  . BACK SURGERY     3 cervical spine surgeries C4-C5 fused  . COLONOSCOPY N/A 01/04/2014   Procedure: COLONOSCOPY;  Surgeon: Danie Binder, MD;  Location: [facility] ENDO SUITE;  Service: Endoscopy;  Laterality: N/A;  1:45  . ESOPHAGOGASTRODUODENOSCOPY N/A 01/04/2014   Procedure: ESOPHAGOGASTRODUODENOSCOPY (EGD);  Surgeon: Danie Binder, MD;  Location: [facility] ENDO SUITE;  Service: Endoscopy;  Laterality: N/A;  . FINGER SURGERY Left    2nd, 3rd, & 4th fingers were cut off by table saw and reattached  . GASTRECTOMY    .  HERNIA REPAIR    . INCISIONAL HERNIA REPAIR N/A 01/20/2014   Procedure: LAPAROSCOPIC RECURRENT  INCISIONAL HERNIA with mesh;  Surgeon: Edward Jolly, MD;  Location: [facility] ORS;  Service: General;  Laterality: N/A;  . rt knee arthroscopic surgery    . SHOULDER SURGERY Bilateral    3 surgeries on on left, 2 surgeries on right    Family History:   Family History  Problem Relation Age of Onset  . Cancer Father        bone  . Cancer Brother        lungs  . Stroke Maternal Grandmother   . Asthma Son        died at age 41 in his sleep   . Spina bifida Son        died at age 33   . Dementia Mother   . Colon cancer Neg Hx    Family Psychiatric  History: Denied Social History:  Social History   Substance and Sexual Activity  Alcohol Use Yes  . Alcohol/week: 3.0 - 4.0 standard drinks  . Types: 3 - 4 Cans of beer per week   Comment: 2 cases of beer     Social History   Substance and Sexual Activity  Drug Use No   Comment: denied using any drugs    Social History   Socioeconomic History  . Marital status: Widowed    Spouse name: Not on file  . Number of children: 3  . Years of education: Not on file  . Highest education level: Not on file  Occupational History  . Occupation: Disability  Social Needs  . Financial resource strain: Not on file  . Food insecurity:    Worry: Not on file    Inability: Not on file  . Transportation needs:    Medical: Yes    Non-medical: Yes  Tobacco Use  . Smoking status: Current Every Day Smoker    Packs/day: 2.00    Years: 52.00    Pack years: 104.00    Types: Cigarettes    Start date: 07/30/1966  . Smokeless tobacco: Never Used  . Tobacco comment: down to 1/4 ppd  Substance and Sexual Activity  . Alcohol use: Yes    Alcohol/week: 3.0 - 4.0 standard drinks    Types: 3 - 4 Cans of beer per week    Comment: 2 cases of beer  . Drug use: No    Comment: denied using any drugs  . Sexual activity: Not Currently  Lifestyle  . Physical activity:    Days per week: Not on file    Minutes per session: Not on file  . Stress: Not on file  Relationships  . Social connections:    Talks on phone: Not on file    Gets together: Not on file    Attends religious service: Not on file    Active member of club or organization: Not on file    Attends meetings of clubs or organizations: Not  on file    Relationship status: Not on file  Other Topics Concern  . Not on file  Social History Narrative  . Not on file   Additional Social History:      Sleep: Fair  Appetite:  Fair  Current Medications: Current Facility-Administered Medications  Medication Dose Route Frequency Provider Last Rate Last Dose  . acetaminophen (TYLENOL) tablet 650 mg  650 mg Oral Q6H PRN Ethelene Hal, NP   650 mg at 10/05/18 0800  .  albuterol (PROVENTIL HFA;VENTOLIN HFA) 108 (90 Base) MCG/ACT inhaler 1-2 puff  1-2 puff Inhalation Q6H PRN Sharma Covert, MD      . alum & mag hydroxide-simeth (MAALOX/MYLANTA) 200-200-20 MG/5ML suspension 30 mL  30 mL Oral Q4H PRN Ethelene Hal, NP      . chlordiazePOXIDE (LIBRIUM) capsule 25 mg  25 mg Oral Q6H PRN Lindon Romp A, NP   25 mg at 78/93/81 0175  . folic acid (FOLVITE) tablet 1 mg  1 mg Oral Daily Sharma Covert, MD   1 mg at 10/05/18 0758  . hydrOXYzine (ATARAX/VISTARIL) tablet 25 mg  25 mg Oral TID PRN Ethelene Hal, NP   25 mg at 10/04/18 2126  . loperamide (IMODIUM) capsule 2-4 mg  2-4 mg Oral PRN Lindon Romp A, NP      . magnesium hydroxide (MILK OF MAGNESIA) suspension 30 mL  30 mL Oral Daily PRN Ethelene Hal, NP      . multivitamin with minerals tablet 1 tablet  1 tablet Oral Daily Lindon Romp A, NP   1 tablet at 10/05/18 0758  . nicotine (NICODERM CQ - dosed in mg/24 hours) patch 21 mg  21 mg Transdermal Daily Cobos, Myer Peer, MD   21 mg at 10/05/18 0758  . ondansetron (ZOFRAN-ODT) disintegrating tablet 4 mg  4 mg Oral Q6H PRN Lindon Romp A, NP      . pantoprazole (PROTONIX) EC tablet 40 mg  40 mg Oral Daily Sharma Covert, MD   40 mg at 10/05/18 0758  . polyethylene glycol (MIRALAX / GLYCOLAX) packet 17 g  17 g Oral Daily Sharma Covert, MD      . senna-docusate (Senokot-S) tablet 1 tablet  1 tablet Oral QHS PRN Sharma Covert, MD      . sertraline (ZOLOFT) tablet 25 mg  25 mg Oral Daily  Sharma Covert, MD   25 mg at 10/05/18 0757  . thiamine (VITAMIN B-1) tablet 100 mg  100 mg Oral Daily Lindon Romp A, NP   100 mg at 10/05/18 0758  . tiotropium (SPIRIVA) inhalation capsule ([facility] use ONLY) 18 mcg  18 mcg Inhalation Daily Sharma Covert, MD   18 mcg at 10/05/18 0757  . traZODone (DESYREL) tablet 100 mg  100 mg Oral QHS PRN,MR X 1 Sharma Covert, MD   100 mg at 10/04/18 2158    Lab Results: No results found for this or any previous visit (from the past 68 hour(s)).  Blood Alcohol level:  Lab Results  Component Value Date   ETH <10 09/06/2018   ETH 352 (HH) 05/23/8526    Metabolic Disorder Labs: Lab Results  Component Value Date   HGBA1C 5.4 06/26/2017   MPG 108 06/26/2017   MPG 105 08/03/2016   No results found for: PROLACTIN Lab Results  Component Value Date   CHOL 247 (H) 02/17/2013   TRIG 144 02/17/2013   HDL 79 02/17/2013   CHOLHDL 3.1 02/17/2013   VLDL 29 02/17/2013   LDLCALC 139 (H) 02/17/2013    Physical Findings: AIMS: Facial and Oral Movements Muscles of Facial Expression: None, normal Lips and Perioral Area: None, normal Jaw: None, normal Tongue: None, normal,Extremity Movements Upper (arms, wrists, hands, fingers): None, normal Lower (legs, knees, ankles, toes): None, normal, Trunk Movements Neck, shoulders, hips: None, normal, Overall Severity Severity of abnormal movements (highest score from questions above): None, normal Incapacitation due to abnormal movements: None, normal Patient's awareness of abnormal movements (rate  only patient's report): No Awareness, Dental Status Current problems with teeth and/or dentures?: Yes Does patient usually wear dentures?: Yes  CIWA:  CIWA-Ar Total: 3 COWS:     Musculoskeletal: Strength & Muscle Tone: within normal limits Gait & Station: normal Patient leans: N/A  Psychiatric Specialty Exam: Physical Exam  Nursing note and vitals reviewed. Constitutional: He is oriented to person,  place, and time.  Neurological: He is alert and oriented to person, place, and time.    Review of Systems  Psychiatric/Behavioral: Positive for depression and substance abuse. Negative for hallucinations, memory loss and suicidal ideas. The patient is nervous/anxious. The patient does not have insomnia.     Blood pressure (!) 118/91, pulse (!) 124, temperature 97.9 F (36.6 C), temperature source Oral, resp. rate (!) 22, height 5\' 5"  (1.651 m), weight 68 kg, SpO2 98 %.Body mass index is 24.96 kg/m.  General Appearance: Fairly Groomed  Eye Contact:  Fair  Speech:  Clear and Coherent and Normal Rate  Volume:  Normal  Mood:  Anxious and Depressed  Affect:  Congruent  Thought Process:  Coherent, Goal Directed, Linear and Descriptions of Associations: Intact  Orientation:  Full (Time, Place, and Person)  Thought Content:  Logical  Suicidal Thoughts:  No  Homicidal Thoughts:  No  Memory:  Immediate;   Fair Recent;   Fair  Judgement:  Impaired  Insight:  Lacking  Psychomotor Activity:  Normal  Concentration:  Concentration: Fair and Attention Span: Fair  Recall:  [employer] of Knowledge:  Fair  Language:  Good  Akathisia:  Negative  Handed:  Right  AIMS (if indicated):     Assets:  Communication Skills Desire for Improvement Resilience Social Support  ADL's:  Intact  Cognition:  WNL  Sleep:  Number of Hours: 5.5     Treatment Plan Summary: Daily contact with patient to assess and evaluate symptoms and progress in treatment  Medication Management: Continue plan which was reviewed and noted below without adjustments at this time.    Continued to encourage patient  to attend groups. Continued Librium protocol where he will receive 25 mg p.o. every 6 hours as needed a CIWA greater than 10. Continued  thiamine and folic acid.  Continued  Zoloft 25 mg p.o. daily for depression.   Continued  COPD medications.  He has a history of  prolonged QTC and EKG ordered. EKG dated 10/03/2018  QTc was >475.    Samuel Maes, NP 10/05/2018, 11:53 AM

## 2018-10-05 NOTE — Progress Notes (Signed)
D: Pt was in dayroom upon initial approach.  Pt presents with anxious, depressed affect and mood.  He reports his day was "painful" because "I can't see that good."  His goal is "just to make it through."  Pt denies SI/HI, denies hallucinations.  Pt has been visible in milieu interacting with peers and staff appropriately.  Pt attended evening group.    A: Introduced self to pt.  Met with pt 1:1.  Actively listened to pt and offered support and encouragement.  PRN medication administered for anxiety and sleep.  Q15 minute safety checks maintained.  R: Pt is safe on the unit.  Pt is compliant with medications.  Pt verbally contracts for safety.  Will continue to monitor and assess.

## 2018-10-05 NOTE — Progress Notes (Signed)
D Pt remains OOB UAL on the unit using his walker. HE remains unsteady on his feet. HE is quite concerned about where he will go at dc time. He says " I want to find a place to live", yet when writer speaks to him, he literally has no ideas where to begin to look.      A HE completed his daily assessment and on this he wrote  He denied SI today and he rated his depression, hopelessness and anxiety " 7/6/7", respectively.           R Safety is in place.

## 2018-10-05 NOTE — Plan of Care (Signed)
  Problem: Education: Goal: Mental status will improve Outcome: Progressing   
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
## 2018-10-06 DIAGNOSIS — F332 Major depressive disorder, recurrent severe without psychotic features: Principal | ICD-10-CM

## 2018-10-06 MED ORDER — TRAZODONE HCL 100 MG PO TABS
100.0000 mg | ORAL_TABLET | Freq: Every evening | ORAL | Status: DC | PRN
  Administered 2018-10-06 – 2018-10-07 (×4): 100 mg via ORAL
  Filled 2018-10-06 (×10): qty 1

## 2018-10-06 NOTE — Progress Notes (Signed)
EKG completed and put on front of patient's chart for review.

## 2018-10-06 NOTE — Tx Team (Signed)
Interdisciplinary Treatment and Diagnostic Plan Update  10/06/2018 Time of Session: 10:00AM Samuel Reyes MRN: [id number]  Principal Diagnosis: <principal problem not specified>  Secondary Diagnoses: Active Problems:   MDD (major depressive disorder), recurrent severe, without psychosis ([place])   Current Medications:  Current Facility-Administered Medications  Medication Dose Route Frequency Provider Last Rate Last Dose  . acetaminophen (TYLENOL) tablet 650 mg  650 mg Oral Q6H PRN Ethelene Hal, NP   650 mg at 10/06/18 0748  . albuterol (PROVENTIL HFA;VENTOLIN HFA) 108 (90 Base) MCG/ACT inhaler 1-2 puff  1-2 puff Inhalation Q6H PRN Sharma Covert, MD      . alum & mag hydroxide-simeth (MAALOX/MYLANTA) 200-200-20 MG/5ML suspension 30 mL  30 mL Oral Q4H PRN Ethelene Hal, NP      . chlordiazePOXIDE (LIBRIUM) capsule 25 mg  25 mg Oral Q6H PRN Lindon Romp A, NP   25 mg at 85/02/77 4128  . folic acid (FOLVITE) tablet 1 mg  1 mg Oral Daily Sharma Covert, MD   1 mg at 10/06/18 7867  . hydrOXYzine (ATARAX/VISTARIL) tablet 25 mg  25 mg Oral TID PRN Ethelene Hal, NP   25 mg at 10/06/18 0748  . loperamide (IMODIUM) capsule 2-4 mg  2-4 mg Oral PRN Lindon Romp A, NP      . magnesium hydroxide (MILK OF MAGNESIA) suspension 30 mL  30 mL Oral Daily PRN Ethelene Hal, NP      . multivitamin with minerals tablet 1 tablet  1 tablet Oral Daily Lindon Romp A, NP   1 tablet at 10/06/18 0743  . nicotine (NICODERM CQ - dosed in mg/24 hours) patch 21 mg  21 mg Transdermal Daily Cobos, Myer Peer, MD   21 mg at 10/06/18 0743  . ondansetron (ZOFRAN-ODT) disintegrating tablet 4 mg  4 mg Oral Q6H PRN Lindon Romp A, NP      . pantoprazole (PROTONIX) EC tablet 40 mg  40 mg Oral Daily Sharma Covert, MD   40 mg at 10/06/18 0744  . polyethylene glycol (MIRALAX / GLYCOLAX) packet 17 g  17 g Oral Daily Sharma Covert, MD   17 g at 10/06/18 0741  . senna-docusate  (Senokot-S) tablet 1 tablet  1 tablet Oral QHS PRN Sharma Covert, MD      . sertraline (ZOLOFT) tablet 25 mg  25 mg Oral Daily Sharma Covert, MD   25 mg at 10/06/18 0744  . thiamine (VITAMIN B-1) tablet 100 mg  100 mg Oral Daily Lindon Romp A, NP   100 mg at 10/06/18 0744  . tiotropium (SPIRIVA) inhalation capsule ([facility] use ONLY) 18 mcg  18 mcg Inhalation Daily Sharma Covert, MD   18 mcg at 10/06/18 0744  . traZODone (DESYREL) tablet 100 mg  100 mg Oral QHS PRN,MR X 1 Sharma Covert, MD   100 mg at 10/05/18 2201   PTA Medications: Medications Prior to Admission  Medication Sig Dispense Refill Last Dose  . albuterol (PROVENTIL HFA;VENTOLIN HFA) 108 (90 Base) MCG/ACT inhaler Inhale 2 puffs into the lungs every 4 (four) hours as needed for wheezing or shortness of breath.   Past Week at Unknown time  . Aspirin-Acetaminophen-Caffeine (GOODYS EXTRA STRENGTH) 500-325-65 MG PACK Take 4 Packages by mouth every 6 (six) hours as needed (for pain).   10/02/2018 at Unknown time  . citalopram (CELEXA) 20 MG tablet Take 1 tablet (20 mg total) by mouth daily. (Patient not taking: Reported on 10/03/2018) 30 tablet  0 Not Taking at Unknown time  . fluticasone (FLONASE) 50 MCG/ACT nasal spray Place 2 sprays into both nostrils daily. (Patient not taking: Reported on 10/03/2018) 16 g 2 Not Taking at Unknown time  . folic acid (FOLVITE) 1 MG tablet Take 1 mg by mouth daily.   Past Month at Unknown time  . gabapentin (NEURONTIN) 300 MG capsule Take 300 mg by mouth 3 (three) times daily.   Past Month at Unknown time  . loratadine (CLARITIN) 10 MG tablet Take 1 tablet (10 mg total) by mouth daily. (Patient not taking: Reported on 10/03/2018) 30 tablet 0 Not Taking at Unknown time  . mometasone-formoterol (DULERA) 100-5 MCG/ACT AERO Inhale 2 puffs into the lungs daily. (Patient not taking: Reported on 10/03/2018) 1 Inhaler 0 Not Taking at Unknown time  . Multiple Vitamin (MULTIVITAMIN WITH MINERALS) TABS tablet  Take 1 tablet by mouth daily. Vitamin supplementation (Patient not taking: Reported on 09/06/2018)   Not Taking at Unknown time  . polyethylene glycol (MIRALAX) packet Take 17 g by mouth daily as needed for moderate constipation. (Patient not taking: Reported on 10/03/2018) 14 each 0 Not Taking at Unknown time  . propranolol (INDERAL) 10 MG tablet Take 1 tablet (10 mg total) by mouth 3 (three) times daily. For anxiety (Patient not taking: Reported on 10/03/2018) 90 tablet 0 Not Taking at Unknown time  . senna-docusate (SENOKOT-S) 8.6-50 MG tablet Take 1 tablet by mouth at bedtime. (Patient not taking: Reported on 10/03/2018) 30 tablet 0 Not Taking at Unknown time  . thiamine 100 MG tablet Take 1 tablet (100 mg total) by mouth daily. (Patient not taking: Reported on 10/03/2018) 30 tablet 3 Not Taking at Unknown time  . tiotropium (SPIRIVA) 18 MCG inhalation capsule Place 1 capsule (18 mcg total) into inhaler and inhale daily. For COPD 30 capsule 12 Past Month at Unknown time  . traZODone (DESYREL) 100 MG tablet Take 1 tablet (100 mg total) by mouth at bedtime as needed for sleep. (Patient not taking: Reported on 10/03/2018) 30 tablet 0 Not Taking at Unknown time  . traZODone (DESYREL) 150 MG tablet Take 150 mg by mouth at bedtime as needed for sleep.   Past Month at Unknown time    Patient Stressors: Financial difficulties Health problems Loss of wife/son Substance abuse Traumatic event  Patient Strengths: Ability for insight Active sense of humor Average or above average intelligence Communication skills Motivation for treatment/growth  Treatment Modalities: Medication Management, Group therapy, Case management,  1 to 1 session with clinician, Psychoeducation, Recreational therapy.   Physician Treatment Plan for Primary Diagnosis: <principal problem not specified> Long Term Goal(s): Improvement in symptoms so as ready for discharge Improvement in symptoms so as ready for discharge   Short Term  Goals: Ability to identify changes in lifestyle to reduce recurrence of condition will improve Ability to verbalize feelings will improve Ability to disclose and discuss suicidal ideas Ability to demonstrate self-control will improve Ability to identify and develop effective coping behaviors will improve Ability to maintain clinical measurements within normal limits will improve Compliance with prescribed medications will improve Ability to identify triggers associated with substance abuse/mental health issues will improve Ability to identify changes in lifestyle to reduce recurrence of condition will improve Ability to verbalize feelings will improve Ability to disclose and discuss suicidal ideas Ability to demonstrate self-control will improve Ability to identify and develop effective coping behaviors will improve Ability to maintain clinical measurements within normal limits will improve Compliance with prescribed medications will improve  Ability to identify triggers associated with substance abuse/mental health issues will improve  Medication Management: Evaluate patient's response, side effects, and tolerance of medication regimen.  Therapeutic Interventions: 1 to 1 sessions, Unit Group sessions and Medication administration.  Evaluation of Outcomes: Progressing  Physician Treatment Plan for Secondary Diagnosis: Active Problems:   MDD (major depressive disorder), recurrent severe, without psychosis ([place])  Long Term Goal(s): Improvement in symptoms so as ready for discharge Improvement in symptoms so as ready for discharge   Short Term Goals: Ability to identify changes in lifestyle to reduce recurrence of condition will improve Ability to verbalize feelings will improve Ability to disclose and discuss suicidal ideas Ability to demonstrate self-control will improve Ability to identify and develop effective coping behaviors will improve Ability to maintain clinical measurements  within normal limits will improve Compliance with prescribed medications will improve Ability to identify triggers associated with substance abuse/mental health issues will improve Ability to identify changes in lifestyle to reduce recurrence of condition will improve Ability to verbalize feelings will improve Ability to disclose and discuss suicidal ideas Ability to demonstrate self-control will improve Ability to identify and develop effective coping behaviors will improve Ability to maintain clinical measurements within normal limits will improve Compliance with prescribed medications will improve Ability to identify triggers associated with substance abuse/mental health issues will improve     Medication Management: Evaluate patient's response, side effects, and tolerance of medication regimen.  Therapeutic Interventions: 1 to 1 sessions, Unit Group sessions and Medication administration.  Evaluation of Outcomes: Progressing   RN Treatment Plan for Primary Diagnosis: <principal problem not specified> Long Term Goal(s): Knowledge of disease and therapeutic regimen to maintain health will improve  Short Term Goals: Ability to demonstrate self-control, Ability to disclose and discuss suicidal ideas and Ability to identify and develop effective coping behaviors will improve  Medication Management: RN will administer medications as ordered by provider, will assess and evaluate patient's response and provide education to patient for prescribed medication. RN will report any adverse and/or side effects to prescribing provider.  Therapeutic Interventions: 1 on 1 counseling sessions, Psychoeducation, Medication administration, Evaluate responses to treatment, Monitor vital signs and CBGs as ordered, Perform/monitor CIWA, COWS, AIMS and Fall Risk screenings as ordered, Perform wound care treatments as ordered.  Evaluation of Outcomes: Progressing   LCSW Treatment Plan for Primary Diagnosis:  <principal problem not specified> Long Term Goal(s): Safe transition to appropriate next level of care at discharge, Engage patient in therapeutic group addressing interpersonal concerns.  Short Term Goals: Engage patient in aftercare planning with referrals and resources, Increase social support, Identify triggers associated with mental health/substance abuse issues and Increase skills for wellness and recovery  Therapeutic Interventions: Assess for all discharge needs, 1 to 1 time with Social worker, Explore available resources and support systems, Assess for adequacy in community support network, Educate family and significant other(s) on suicide prevention, Complete Psychosocial Assessment, Interpersonal group therapy.  Evaluation of Outcomes: Progressing   Progress in Treatment: Attending groups: Yes. Participating in groups: Yes. Taking medication as prescribed: Yes. Toleration medication: Yes. Family/Significant other contact made: Yes, individual(s) contacted:  mother Patient understands diagnosis: Yes. Discussing patient identified problems/goals with staff: Yes. Medical problems stabilized or resolved: Yes. Denies suicidal/homicidal ideation: No. Issues/concerns per patient self-inventory: Yes.  New problem(s) identified: Yes, Describe:  homeless, no supports  New Short Term/Long Term Goal(s): detox, medication management for mood stabilization; elimination of SI thoughts; development of comprehensive mental wellness/sobriety plan.  Patient Goals:  "I want to  stop drinking and get into an [facility]."   Discharge Plan or Barriers: [employer] provided to patient. CSW assessing for other appropriate referrals.   Reason for Continuation of Hospitalization: Anxiety Depression Suicidal ideation Withdrawal symptoms  Estimated Length of Stay: 1-3 days  Attendees: Patient:  10/06/2018 12:46 PM  Physician:  10/06/2018 12:46 PM  Nursing:  10/06/2018 12:46 PM  RN Care  Manager: 10/06/2018 12:46 PM  Social Worker: Stephanie Acre, [place] 10/06/2018 12:46 PM  Recreational Therapist:  10/06/2018 12:46 PM  Other:  10/06/2018 12:46 PM  Other:  10/06/2018 12:46 PM  Other: 10/06/2018 12:46 PM    Scribe for Treatment Team: Joellen [place], [place] 10/06/2018 12:46 PM

## 2018-10-06 NOTE — Progress Notes (Addendum)
D:  Patient's self inventory sheet, patient has fair sleep, sleep medication not helpful.  Fair appetite, low energy level, good concentration.  Rated depression 8, hopeless and anxiety 7.  Denied withdrawals.  SI off/on, contracts for safety, no plan.  Physical problems, lightheaded, dizziness, headaches, blurred vision.  Physical pain, head, neck, no pain medicine.  Goal is a place to live. A:  Medications administered per MD orders.  Emotional support and encouragement given patient. R:  Denied HI.   Does hear voice of his son and sometimes sees his deceased son.  SI off/on,, contacts for safety, no plan.  Safety maintained with 15 minute checks.

## 2018-10-06 NOTE — Progress Notes (Signed)
MEDICATIONS GIVEN TO NURSE THAT HAD BEEN DELIVERED TO ADULT UNIT NURSE STATION.  Both medications were in [facility] bottles gabapentin 300 mg and another [facility] bottle folic acid 1 mg.   Medications were placed in medication room on [address] hall.

## 2018-10-06 NOTE — Progress Notes (Signed)
CSW reviewed [employer] list and [employer] list with patient and assisted patient in making phone calls (poor eyesight and lost his glasses).   Patient was able to speak with "Josh" at an [facility]. Patient needs to call Josh tomorrow at 8am to complete a phone interview. The phone number for [facility] was written in large print and given to patient.  CSW to follow up with patient after his phone screening.  Stephanie Acre, LCSW-A Clinical Social Worker

## 2018-10-06 NOTE — Progress Notes (Signed)
Patient attended grief and loss group facilitated by Kerry Hough, MS, [facility], [place].   Group focuses on change and loss experienced by group members; topics include loss due to death, loss of relationships, loss of a place, loss of sense of self, etc. Group members are invited to share the changes and losses that are currently affecting their lives. Facilitators tie together shared experiences between group members and validate emotions felt by participants.  Group members connected their experiences of grieving themselves, grieving changes in relationships, and dealing with others' expectations for how the individuals should grieve to one another. Group members created connections based on their grief experiences. Group members expressed gratitude for a group devoted to grief and reported it was the first time grief was addressed in treatment.  Patient Samuel Reyes was present through and participated in group discussion. Pt shared that he is grieving the losses of his sons and his wife. Pt shared how in the past he has been given handouts on "how to grieve" and how the space to talk about grief is not easy to find. Pt expressed gratefulness for a group where he could talk about grieving on his own timeline as opposed to how a handout would say to do it. Pt shared that he grieves his mother whose health is ailing, his body, and his life in [place]. Pt identified that hospice groups may be helpful for him following discharge.

## 2018-10-06 NOTE — Plan of Care (Signed)
Nurse discussed anxiety, depression and coping skills with patient.  

## 2018-10-06 NOTE — Plan of Care (Signed)
  Problem: Activity: Goal: Sleeping patterns will improve Outcome: Progressing Note:  Slept 6.75 hour last night per flowsheet.

## 2018-10-06 NOTE — Progress Notes (Signed)
CSW spoke with patient's [facility], Margie Ege [phone]). CSW updated Care Coordinator of patient's expected discharge tomorrow and his intentions to follow up with [facility] for outpatient care.  Stephanie Acre, LCSW-A Clinical Social Worker

## 2018-10-06 NOTE — Progress Notes (Signed)
Adult Psychoeducational Group Note  Date:  10/06/2018 Time:  2:04 PM  Group Topic/Focus:  Goals Group:   The focus of this group is to help patients establish daily goals to achieve during treatment and discuss how the patient can incorporate goal setting into their daily lives to aide in recovery.  Participation Level:  Active  Participation Quality:  Appropriate  Affect:  Appropriate  Cognitive:  Alert  Insight: Appropriate  Engagement in Group:  Engaged  Modes of Intervention:  Discussion  Additional Comments:  Pt attended group and participated in discussion.  Rehana Uncapher R Jashun Puertas 10/06/2018, 2:04 PM
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
## 2018-10-07 MED ORDER — PRAZOSIN HCL 1 MG PO CAPS
1.0000 mg | ORAL_CAPSULE | Freq: Every day | ORAL | Status: DC
  Administered 2018-10-07: 1 mg via ORAL
  Filled 2018-10-07 (×4): qty 1

## 2018-10-07 MED ORDER — SERTRALINE HCL 50 MG PO TABS
50.0000 mg | ORAL_TABLET | Freq: Every day | ORAL | Status: DC
  Administered 2018-10-08: 50 mg via ORAL
  Filled 2018-10-07 (×3): qty 1

## 2018-10-07 NOTE — Progress Notes (Signed)
[facility] MD Progress Note  10/07/2018 12:35 PM Samuel Reyes  MRN:  [id number]   Subjective: Patient reports today that he is feeling pretty good.  He states that he did his phone screening with the [facility] and now he is waiting for discharge so that he can do a face-to-face interview with him at the [facility] that he is chosen.  He reports that he has chronic vague suicidality that waxes and wanes and that at the moment is not of no concern to him.  He reports feeling much better today and does not feel depressed and feels like he has a chance since he is looking at a place to go.  He also reports that he is interested in an ALF, but understands that that is a lengthy process and he will work with his PCP to try to get in there.  Today he denies any current suicidality or homicidality and denies any hallucinations.  He reports that he continues to have dreams of his son and his wife who are both deceased.  He states that the dreams are them telling him to come home as in today to come to see them.  He reports that he is not having any side effects from the Zoloft or the trazodone.  He reports that his appetite has been good and his sleep is still disrupted due to his dreams.  He states that he feels that he can go at any time knowing that he can go to the [facility] now.  Objective: Patient's chart and findings reviewed and discussed with treatment team.  Patient presents in the day room and is interacting with peers and staff appropriately.  Patient is pleasant, calm, and cooperative.  Patient has been seen attending groups and has been interacting with peers and staff appropriately.  Due to patient's complaint of dreams of his deceased son and wife and difficulty with sleep due to the dreams will start patient on prazosin and will increase his Zoloft to 50 mg a day.  Principal Problem: MDD (major depressive disorder), recurrent severe, without psychosis ([place]) Diagnosis: Principal Problem:   MDD  (major depressive disorder), recurrent severe, without psychosis ([place]) Active Problems:   Alcohol use  Total Time spent with patient: 30 minutes  Past Psychiatric History: See H&P  Past Medical History:  Past Medical History:  Diagnosis Date  . Alcoholism ([place])   . CAD (coronary artery disease) 2002   MI, no intervention required  . COPD (chronic obstructive pulmonary disease) (HCC)    not on home O2  . Depression   . GERD (gastroesophageal reflux disease)   . Headache   . Hypertension   . Stroke [facility]) 2006    Past Surgical History:  Procedure Laterality Date  . BACK SURGERY     3 cervical spine surgeries C4-C5 fused  . COLONOSCOPY N/A 01/04/2014   Procedure: COLONOSCOPY;  Surgeon: Danie Binder, MD;  Location: [facility]O [facility];  Service: Endoscopy;  Laterality: N/A;  1:45  . ESOPHAGOGASTRODUODENOSCOPY N/A 01/04/2014   Procedure: ESOPHAGOGASTRODUODENOSCOPY (EGD);  Surgeon: Danie Binder, MD;  Location: [facility] ENDO SUITE;  Service: Endoscopy;  Laterality: N/A;  . FINGER SURGERY Left    2nd, 3rd, & 4th fingers were cut off by table saw and reattached  . GASTRECTOMY    . HERNIA REPAIR    . INCISIONAL HERNIA REPAIR N/A 01/20/2014   Procedure: LAPAROSCOPIC RECURRENT  INCISIONAL HERNIA with mesh;  Surgeon: Edward Jolly, MD;  Location: [facility] ORS;  Service: General;  Laterality: N/A;  . rt knee arthroscopic surgery    . SHOULDER SURGERY Bilateral    3 surgeries on on left, 2 surgeries on right    Family History:  Family History  Problem Relation Age of Onset  . Cancer Father        bone  . Cancer Brother        lungs  . Stroke Maternal Grandmother   . Asthma Son        died at age 69 in his sleep   . Spina bifida Son        died at age 35   . Dementia Mother   . Colon cancer Neg Hx    Family Psychiatric  History: See H&P Social History:  Social History   Substance and Sexual Activity  Alcohol Use Yes  . Alcohol/week: 3.0 - 4.0 standard drinks  . Types: 3 - 4 Cans of  beer per week   Comment: 2 cases of beer     Social History   Substance and Sexual Activity  Drug Use No   Comment: denied using any drugs    Social History   Socioeconomic History  . Marital status: Widowed    Spouse name: Not on file  . Number of children: 3  . Years of education: Not on file  . Highest education level: Not on file  Occupational History  . Occupation: Disability  Social Needs  . Financial resource strain: Not on file  . Food insecurity:    Worry: Not on file    Inability: Not on file  . Transportation needs:    Medical: Yes    Non-medical: Yes  Tobacco Use  . Smoking status: Current Every Day Smoker    Packs/day: 2.00    Years: 52.00    Pack years: 104.00    Types: Cigarettes    Start date: 07/30/1966  . Smokeless tobacco: Never Used  . Tobacco comment: down to 1/4 ppd  Substance and Sexual Activity  . Alcohol use: Yes    Alcohol/week: 3.0 - 4.0 standard drinks    Types: 3 - 4 Cans of beer per week    Comment: 2 cases of beer  . Drug use: No    Comment: denied using any drugs  . Sexual activity: Not Currently  Lifestyle  . Physical activity:    Days per week: Not on file    Minutes per session: Not on file  . Stress: Not on file  Relationships  . Social connections:    Talks on phone: Not on file    Gets together: Not on file    Attends religious service: Not on file    Active member of club or organization: Not on file    Attends meetings of clubs or organizations: Not on file    Relationship status: Not on file  Other Topics Concern  . Not on file  Social History Narrative  . Not on file   Additional Social History:                         Sleep: Fair  Appetite:  Good  Current Medications: Current Facility-Administered Medications  Medication Dose Route Frequency Provider Last Rate Last Dose  . acetaminophen (TYLENOL) tablet 650 mg  650 mg Oral Q6H PRN Ethelene Hal, NP   650 mg at 10/07/18 0800  . albuterol  (PROVENTIL HFA;VENTOLIN HFA) 108 (90 Base) MCG/ACT inhaler 1-2 puff  1-2 puff Inhalation Q6H PRN Sharma Covert, MD      . alum & mag hydroxide-simeth (MAALOX/MYLANTA) 200-200-20 MG/5ML suspension 30 mL  30 mL Oral Q4H PRN Ethelene Hal, NP      . folic acid (FOLVITE) tablet 1 mg  1 mg Oral Daily Sharma Covert, MD   1 mg at 10/07/18 0754  . magnesium hydroxide (MILK OF MAGNESIA) suspension 30 mL  30 mL Oral Daily PRN Ethelene Hal, NP      . multivitamin with minerals tablet 1 tablet  1 tablet Oral Daily Lindon Romp A, NP   1 tablet at 10/07/18 0754  . nicotine (NICODERM CQ - dosed in mg/24 hours) patch 21 mg  21 mg Transdermal Daily Cobos, Myer Peer, MD   21 mg at 10/07/18 0753  . pantoprazole (PROTONIX) EC tablet 40 mg  40 mg Oral Daily Sharma Covert, MD   40 mg at 10/07/18 0754  . polyethylene glycol (MIRALAX / GLYCOLAX) packet 17 g  17 g Oral Daily Sharma Covert, MD   17 g at 10/06/18 0741  . prazosin (MINIPRESS) capsule 1 mg  1 mg Oral QHS Shawan Corella, Lowry Ram, FNP      . senna-docusate (Senokot-S) tablet 1 tablet  1 tablet Oral QHS PRN Sharma Covert, MD      . Derrill Memo ON 10/08/2018] sertraline (ZOLOFT) tablet 50 mg  50 mg Oral Daily Rosaleen Mazer B, FNP      . thiamine (VITAMIN B-1) tablet 100 mg  100 mg Oral Daily Lindon Romp A, NP   100 mg at 10/07/18 0754  . tiotropium (SPIRIVA) inhalation capsule ([facility] use ONLY) 18 mcg  18 mcg Inhalation Daily Sharma Covert, MD   18 mcg at 10/07/18 [phone]  . traZODone (DESYREL) tablet 100 mg  100 mg Oral QHS,MR X 1 Lindon Romp A, NP   100 mg at 10/06/18 2220    Lab Results: No results found for this or any previous visit (from the past 37 hour(s)).  Blood Alcohol level:  Lab Results  Component Value Date   ETH <10 09/06/2018   ETH 352 (HH) 82/42/3536    Metabolic Disorder Labs: Lab Results  Component Value Date   HGBA1C 5.4 06/26/2017   MPG 108 06/26/2017   MPG 105 08/03/2016   No results found for:  PROLACTIN Lab Results  Component Value Date   CHOL 247 (H) 02/17/2013   TRIG 144 02/17/2013   HDL 79 02/17/2013   CHOLHDL 3.1 02/17/2013   VLDL 29 02/17/2013   LDLCALC 139 (H) 02/17/2013    Physical Findings: AIMS: Facial and Oral Movements Muscles of Facial Expression: None, normal Lips and Perioral Area: None, normal Jaw: None, normal Tongue: None, normal,Extremity Movements Upper (arms, wrists, hands, fingers): None, normal Lower (legs, knees, ankles, toes): None, normal, Trunk Movements Neck, shoulders, hips: None, normal, Overall Severity Severity of abnormal movements (highest score from questions above): None, normal Incapacitation due to abnormal movements: None, normal Patient's awareness of abnormal movements (rate only patient's report): No Awareness, Dental Status Current problems with teeth and/or dentures?: Yes Does patient usually wear dentures?: Yes  CIWA:  CIWA-Ar Total: 1 COWS:  COWS Total Score: 3  Musculoskeletal: Strength & Muscle Tone: within normal limits Gait & Station: uses walker to ambulate Patient leans: N/A  Psychiatric Specialty Exam: Physical Exam  Nursing note and vitals reviewed. Constitutional: He is oriented to person, place, and time. He appears well-developed and well-nourished.  Cardiovascular: Normal  rate.  Respiratory: Effort normal.  Musculoskeletal: Normal range of motion.  Neurological: He is alert and oriented to person, place, and time.  Skin: Skin is warm.    Review of Systems  Constitutional: Negative.   HENT: Negative.   Eyes: Negative.   Respiratory: Negative.   Cardiovascular: Negative.   Gastrointestinal: Negative.   Genitourinary: Negative.   Musculoskeletal: Negative.   Skin: Negative.   Neurological: Negative.   Endo/Heme/Allergies: Negative.   Psychiatric/Behavioral: Positive for depression and suicidal ideas (vague and no intent or plan).    Blood pressure (!) 145/75, pulse 81, temperature 97.7 F (36.5  C), temperature source Oral, resp. rate (!) 22, height 5\' 5"  (1.651 m), weight 68 kg, SpO2 98 %.Body mass index is 24.96 kg/m.  General Appearance: Casual  Eye Contact:  Good  Speech:  Clear and Coherent and Normal Rate  Volume:  Normal  Mood:  Euthymic  Affect:  Congruent  Thought Process:  Coherent and Descriptions of Associations: Intact  Orientation:  Full (Time, Place, and Person)  Thought Content:  WDL  Suicidal Thoughts:  Vague  Homicidal Thoughts:  No  Memory:  Immediate;   Good Recent;   Good Remote;   Good  Judgement:  Fair  Insight:  Fair  Psychomotor Activity:  Normal  Concentration:  Concentration: Good and Attention Span: Good  Recall:  Good  Fund of Knowledge:  Good  Language:  Good  Akathisia:  No  Handed:  Right  AIMS (if indicated):     Assets:  Communication Skills Desire for Improvement Financial Resources/Insurance Social Support  ADL's:  Intact  Cognition:  WNL  Sleep:  Number of Hours: 6   Problems addressed Alcohol use MDD severe recurrent without psychosis  Treatment Plan Summary: Daily contact with patient to assess and evaluate symptoms and progress in treatment, Medication management and Plan is to: Increase Zoloft 50 mg p.o. daily for MDD Continue trazodone 100 mg p.o. nightly as needed for insomnia Start prazosin 1 mg p.o. nightly for nightmares Encourage group therapy participation Potential discharge for tomorrow with assistance from social work with patient going to an [employer], [place] 10/07/2018, 12:36 PM

## 2018-10-07 NOTE — [facility] Group Notes (Signed)
Adult Psychoeducational Group Note  Date:  10/07/2018 Time:  10:10 AM  Group Topic/Focus:  Goals Group:   The focus of this group is to help patients establish daily goals to achieve during treatment and discuss how the patient can incorporate goal setting into their daily lives to aide in recovery.  Participation Level:  Active  Participation Quality:  Appropriate  Affect:  Appropriate  Cognitive:  Appropriate  Insight: Appropriate  Engagement in Group:  Engaged  Modes of Intervention:  Discussion  Additional Comments:  Pt participated in morning goals group.   Samuel Reyes 10/07/2018, 10:10 AM

## 2018-10-07 NOTE — Progress Notes (Signed)
Patient ID: Samuel Reyes, male   DOB: 1955-10-17, 63 y.o.   MRN: [id number]   D: Patient still has some depression and anxiety on approach. Reports he doesn't know where he is going when he leaves here. [place]. Pleasant and interacting well on the unit. A: Staff will monitor on q 15 minute, follow treatment plan, and give medications as ordered. R: Cooperative on the unit.

## 2018-10-07 NOTE — Plan of Care (Signed)
Nurse discussed anxiety, depression, coping skills with patient. 

## 2018-10-07 NOTE — Progress Notes (Signed)
Patient completed his phone screening with an [facility] and was told he would need to call "Josh" at the [facility] on the day of discharge and then he would need to come to the home for an in person interview.   CSW and NP met with patient at bedside. Patient reports he is agreeable to looking into an ALF or ILF for long term housing arrangements. CSW explained this can be a time consuming process and would not be coordinated from [facility]. Patient has an appointment with his primary care doctor next week (03/16). CSW encouraged patient to speak to his PCP about moving to an ALF/ILF to complete a PASSR and an FL2.  Patient expected to discharge tomorrow. He will follow up with the [facility] interview and will be provided shelter resources and a bus pass. Patient has stayed at [facility] in the past.  Stephanie Acre, [place] Social Worker

## 2018-10-07 NOTE — Progress Notes (Signed)
D:  Patient's self inventory sheet, patient has fair sleep, sleep medication helpful sometimes.  Fair appetite, low energy level, good concentration.  Rated depression and hopeless 7, anxiety 8.  Denied withdrawals.  SI off/on, contracts for safety.  Physical problems, lightheaded, dizziness, headaches.  Physical pain, worst pain #8 in past 24 hours.  Pain medicine not helpful.  Goal is place to live.  Plans to make more calls. A:  Medications administered per MD orders.  Emotional support and encouragement given patient. R:  Patient denied SI and HI while talking to nurse.  Stated he continues to hear his deceased son's voice and see him intermittently.  Safety maintained with 15 minute checks. Patient looking forward to discharge tomorrow.

## 2018-10-08 MED ORDER — ACETAMINOPHEN 325 MG PO TABS: 650.0000 mg | ORAL_TABLET | Freq: Four times a day (QID) | ORAL | Status: DC | PRN

## 2018-10-08 MED ORDER — PRAZOSIN HCL 1 MG PO CAPS: 1.0000 mg | ORAL_CAPSULE | Freq: Every day | ORAL | 0 refills | Status: DC

## 2018-10-08 MED ORDER — SERTRALINE HCL 50 MG PO TABS: 50.0000 mg | ORAL_TABLET | Freq: Every day | ORAL | 0 refills | Status: DC

## 2018-10-08 MED ORDER — PANTOPRAZOLE SODIUM 40 MG PO TBEC: 40.0000 mg | DELAYED_RELEASE_TABLET | Freq: Every day | ORAL | 0 refills | Status: DC

## 2018-10-08 MED ORDER — TRAZODONE HCL 100 MG PO TABS: 100.0000 mg | ORAL_TABLET | Freq: Every evening | ORAL | 0 refills | Status: DC | PRN

## 2018-10-08 MED ORDER — NICOTINE 21 MG/24HR TD PT24: 21.0000 mg | MEDICATED_PATCH | Freq: Every day | TRANSDERMAL | 0 refills | Status: DC

## 2018-10-08 NOTE — Progress Notes (Signed)
Recreation Therapy Notes  Date:  3.11.20 Time: 0930 Location: [address] Dayroom  Group Topic: Stress Management  Goal Area(s) Addresses:  Patient will identify positive stress management techniques. Patient will identify benefits of using stress management post d/c.  Behavioral Response:  Engaged  Intervention: Stress Management  Activity :  Guided Imagery.  LRT introduced the stress management technique of guided imagery.  LRT read a script that allowed patients to visualize relaxing in a meadow in spring time.  Patients were to listen and follow along as the script was read.  Education:  Stress Management, Discharge Planning.   Education Outcome: Acknowledges Education  Clinical Observations/Feedback: Pt attended and participated in group.     [name], LRT/CTRS         [name] A 10/08/2018 10:35 AM

## 2018-10-08 NOTE — Progress Notes (Signed)
Patient ID: Samuel Reyes, male   DOB: 1955/12/06, 63 y.o.   MRN: [id number]  D: Patient reports mood better tonight but still is anxious and worried about living situation. Hopeful that something can be done for him. Using walker and gait has been steady tonight. Attending group and interacting well with others. A: Staff will monitor on q 15 minute checks, follow treatment plan, and give medications as ordered. R: Cooperative on the unit.

## 2018-10-08 NOTE — Progress Notes (Signed)
Adult Psychoeducational Group Note  Date:  10/08/2018 Time:  10:35 AM  Group Topic/Focus:  Goals Group:   The focus of this group is to help patients establish daily goals to achieve during treatment and discuss how the patient can incorporate goal setting into their daily lives to aide in recovery.  Participation Level:  Active  Participation Quality:  Appropriate  Affect:  Appropriate  Cognitive:  Alert  Insight: Appropriate  Engagement in Group:  Engaged  Modes of Intervention:  Discussion  Additional Comments:  Pt attended group and participated in discussion.  Whitten Andreoni R Jahdiel Krol 10/08/2018, 10:35 AM

## 2018-10-08 NOTE — Discharge Summary (Signed)
Physician Discharge Summary Note  Patient:  Samuel Reyes is an 63 y.o., male MRN:  [id number] DOB:  11-16-1955 Patient phone:  [phone] (home)  Patient address:   [place] [address],  Total Time spent with patient: 15 minutes  Date of Admission:  10/03/2018 Date of Discharge: 10/08/2018  Reason for Admission:  Suicidal ideation  Principal Problem: MDD (major depressive disorder), recurrent severe, without psychosis ([place]) Discharge Diagnoses: Principal Problem:   MDD (major depressive disorder), recurrent severe, without psychosis ([place]) Active Problems:   Alcohol use   Past Psychiatric History: Per admission H&P: Patient has had multiple psychiatric hospitalizations at this facility.  He has been admitted 3 times in the last several months.  His last admission directly to our facility was on 07/18/2018.  He was readmitted on 09/06/2018 after an intentional overdose.  Most of his psychiatric hospitalizations involve alcohol.  Past Medical History:  Past Medical History:  Diagnosis Date  . Alcoholism ([place])   . CAD (coronary artery disease) 2002   MI, no intervention required  . COPD (chronic obstructive pulmonary disease) (H[facility])    not on home O2  . Depression   . GERD (gastroesophageal reflux disease)   . Headache   . Hypertension   . Stroke [facility]) 2006    Past Surgical History:  Procedure Laterality Date  . BACK SURGERY     3 cervical spine surgeries C4-C5 fused  . COLONOSCOPY N/A 01/04/2014   Procedure: COLONOSCOPY;  Surgeon: Danie Binder, MD;  Location: [facility];  Service: Endoscopy;  Laterality: N/A;  1:45  . ESOPHAGOGASTRODUODENOSCOPY N/A 01/04/2014   Procedure: ESOPHAGOGASTRODUODENOSCOPY (EGD);  Surgeon: Danie Binder, MD;  Location: [facility];  Service: Endoscopy;  Laterality: N/A;  . FINGER SURGERY Left    2nd, 3rd, & 4th fingers were cut off by table saw and reattached  . GASTRECTOMY    . HERNIA REPAIR    . INCISIONAL HERNIA REPAIR N/A  01/20/2014   Procedure: LAPAROSCOPIC RECURRENT  INCISIONAL HERNIA with mesh;  Surgeon: Edward Jolly, MD;  Location: [facility];  Service: General;  Laterality: N/A;  . rt knee arthroscopic surgery    . SHOULDER SURGERY Bilateral    3 surgeries on on left, 2 surgeries on right    Family History:  Family History  Problem Relation Age of Onset  . Cancer Father        bone  . Cancer Brother        lungs  . Stroke Maternal Grandmother   . Asthma Son        died at age 45 in his sleep   . Spina bifida Son        died at age 58   . Dementia Mother   . Colon cancer Neg Hx    Family Psychiatric  History: Per admission H&P: Denied Social History:  Social History   Substance and Sexual Activity  Alcohol Use Yes  . Alcohol/week: 3.0 - 4.0 standard drinks  . Types: 3 - 4 Cans of beer per week   Comment: 2 cases of beer     Social History   Substance and Sexual Activity  Drug Use No   Comment: denied using any drugs    Social History   Socioeconomic History  . Marital status: Widowed    Spouse name: Not on file  . Number of children: 3  . Years of education: Not on file  . Highest education level: Not on file  Occupational  History  . Occupation: Disability  Social Needs  . Financial resource strain: Not on file  . Food insecurity:    Worry: Not on file    Inability: Not on file  . Transportation needs:    Medical: Yes    Non-medical: Yes  Tobacco Use  . Smoking status: Current Every Day Smoker    Packs/day: 2.00    Years: 52.00    Pack years: 104.00    Types: Cigarettes    Start date: 07/30/1966  . Smokeless tobacco: Never Used  . Tobacco comment: down to 1/4 ppd  Substance and Sexual Activity  . Alcohol use: Yes    Alcohol/week: 3.0 - 4.0 standard drinks    Types: 3 - 4 Cans of beer per week    Comment: 2 cases of beer  . Drug use: No    Comment: denied using any drugs  . Sexual activity: Not Currently  Lifestyle  . Physical activity:    Days per week: Not  on file    Minutes per session: Not on file  . Stress: Not on file  Relationships  . Social connections:    Talks on phone: Not on file    Gets together: Not on file    Attends religious service: Not on file    Active member of club or organization: Not on file    Attends meetings of clubs or organizations: Not on file    Relationship status: Not on file  Other Topics Concern  . Not on file  Social History Narrative  . Not on file    Hospital Course:  Per admission H&P 10/04/2018: Patient is a 63 year old male with a past psychiatric history significant for alcohol dependence, depression, chronic pain, COPD who presented to the [facility] emergency department on 10/02/2018 with suicidal ideation. Patient stated that he had stepped in front of a car, and "the guy braked, I have bad luck". The patient has a long history of psychiatric admissions secondary to alcohol and depression. He has apparently attempted to kill himself on 3 occasions. He attempted suicide by overdosing a few weeks ago, and a cut himself 3 years ago. He stated he is homeless, but that his sister lives in [place] and she has weapons in the home. His last admission to our facility was on 07/18/2018. At that time he was discharged to the [facility] residential program. He was being treated with Celexa for depression as well. His medical conditions include coronary artery disease as well as COPD. He admitted to suicidal ideation currently.  This was one of multiple hospitalizations for Mr. Stoudt related to depression and alcohol abuse. He presented to the ED for suicidal ideation. UDS was positive for BZDs. CIWA protocol was ordered with Librium CIWA>10 but patient did not show withdrawal symptoms. He was started on Zoloft and prazosin for nightmares. He participated in group therapy on the unit. CSW worked with patient on housing, as this appeared to be his main stressor. He is leaving with an interview  scheduled at the [facility] and given shelter resources as well. He is agreeable to looking into ALF or ILF and has an appointment with his PCP on 10/13/18- encouraged to speak to PCP about a PASSR and an FL2. Mr. Linck remained on the [facility] unit for 5 days. He stabilized with medication and therapy. He was discharged on the medications listed below. He has shown improvement with improved mood, affect, sleep, appetite, and interaction. He denies any SI/HI/AVH  and contracts for safety. He agrees to follow up at [facility] (see below). Patient is provided with prescriptions for medications upon discharge. He is discharging with bus pass to [employer] interview.  Physical Findings: AIMS: Facial and Oral Movements Muscles of Facial Expression: None, normal Lips and Perioral Area: None, normal Jaw: None, normal Tongue: None, normal,Extremity Movements Upper (arms, wrists, hands, fingers): None, normal Lower (legs, knees, ankles, toes): None, normal, Trunk Movements Neck, shoulders, hips: None, normal, Overall Severity Severity of abnormal movements (highest score from questions above): None, normal Incapacitation due to abnormal movements: None, normal Patient's awareness of abnormal movements (rate only patient's report): No Awareness, Dental Status Current problems with teeth and/or dentures?: Yes Does patient usually wear dentures?: Yes  CIWA:  CIWA-Ar Total: 1 COWS:  COWS Total Score: 2  Musculoskeletal: Strength & Muscle Tone: within normal limits Gait & Station: normal Patient leans: N/A  Psychiatric Specialty Exam: Physical Exam  Nursing note and vitals reviewed. Constitutional: He is oriented to person, place, and time. He appears well-developed and well-nourished.  Respiratory: Effort normal.  Neurological: He is alert and oriented to person, place, and time.    Review of Systems  Constitutional: Negative.   Psychiatric/Behavioral: Positive for  depression (improving) and substance abuse (ETOH, BZDs). Negative for hallucinations, memory loss and suicidal ideas. The patient is not nervous/anxious and does not have insomnia.     Blood pressure 102/74, pulse (!) 122, temperature 97.8 F (36.6 C), temperature source Oral, resp. rate 18, height 5\' 5"  (1.651 m), weight 68 kg, SpO2 98 %.Body mass index is 24.96 kg/m.  See MD's discharge SRA        Has this patient used any form of tobacco in the last 30 days? (Cigarettes, Smokeless Tobacco, Cigars, and/or Pipes)  No  Blood Alcohol level:  Lab Results  Component Value Date   ETH <10 09/06/2018   ETH 352 (HH) 38/93/7342    Metabolic Disorder Labs:  Lab Results  Component Value Date   HGBA1C 5.4 06/26/2017   MPG 108 06/26/2017   MPG 105 08/03/2016   No results found for: PROLACTIN Lab Results  Component Value Date   CHOL 247 (H) 02/17/2013   TRIG 144 02/17/2013   HDL 79 02/17/2013   CHOLHDL 3.1 02/17/2013   VLDL 29 02/17/2013   LDLCALC 139 (H) 02/17/2013    See Psychiatric Specialty Exam and Suicide Risk Assessment completed by Attending Physician prior to discharge.  Discharge destination:  Other:  [facility]/shelter  Is patient on multiple antipsychotic therapies at discharge:  No   Has Patient had three or more failed trials of antipsychotic monotherapy by history:  No  Recommended Plan for Multiple Antipsychotic Therapies: NA  Discharge Instructions    Discharge instructions   Complete by:  As directed    Patient is instructed to take all prescribed medications as recommended. Report any side effects or adverse reactions to your outpatient psychiatrist. Patient is instructed to abstain from alcohol and illegal drugs while on prescription medications. In the event of worsening symptoms, patient is instructed to call the crisis hotline, 911, or go to the nearest emergency department for evaluation and treatment.     Allergies as of 10/08/2018      Reactions    Bee Venom Anaphylaxis   Penicillins Rash   Has patient had a PCN reaction causing immediate rash, facial/tongue/throat swelling, SOB or lightheadedness with hypotension: {Yes Has patient had a PCN reaction causing severe rash involving mucus  membranes or skin necrosis: YES Has patient had a PCN reaction that required hospitalization Yes Has patient had a PCN reaction occurring within the last 10 years: YES If all of the above answers are "NO", then may proceed with Cephalosporin use.   Vancomycin Tinitus      Medication List    STOP taking these medications   citalopram 20 MG tablet Commonly known as:  CELEXA   fluticasone 50 MCG/ACT nasal spray Commonly known as:  FLONASE   gabapentin 300 MG capsule Commonly known as:  Psychologist, occupational MG Pack Generic drug:  Aspirin-Acetaminophen-Caffeine   loratadine 10 MG tablet Commonly known as:  CLARITIN   mometasone-formoterol 100-5 MCG/ACT Aero Commonly known as:  Dulera   polyethylene glycol packet Commonly known as:  MiraLax   propranolol 10 MG tablet Commonly known as:  INDERAL   senna-docusate 8.6-50 MG tablet Commonly known as:  Senokot-S     TAKE these medications     Indication  acetaminophen 325 MG tablet Commonly known as:  TYLENOL Take 2 tablets (650 mg total) by mouth every 6 (six) hours as needed for mild pain.  Indication:  Pain   albuterol 108 (90 Base) MCG/ACT inhaler Commonly known as:  PROVENTIL HFA;VENTOLIN HFA Inhale 2 puffs into the lungs every 4 (four) hours as needed for wheezing or shortness of breath.  Indication:  Chronic Obstructive Lung Disease   folic acid 1 MG tablet Commonly known as:  FOLVITE Take 1 mg by mouth daily.  Indication:  Supplementation   multivitamin with minerals Tabs tablet Take 1 tablet by mouth daily. Vitamin supplementation  Indication:  Vitamin drficiency   nicotine 21 mg/24hr patch Commonly known as:  NICODERM CQ - dosed in mg/24  hours Place 1 patch (21 mg total) onto the skin daily. For smoking cessation  Indication:  Nicotine Addiction   pantoprazole 40 MG tablet Commonly known as:  PROTONIX Take 1 tablet (40 mg total) by mouth daily. For reflux  Indication:  Gastroesophageal Reflux Disease   prazosin 1 MG capsule Commonly known as:  MINIPRESS Take 1 capsule (1 mg total) by mouth at bedtime. For nightmares  Indication:  Frightening Dreams   sertraline 50 MG tablet Commonly known as:  ZOLOFT Take 1 tablet (50 mg total) by mouth daily. For mood  Indication:  Mood   thiamine 100 MG tablet Take 1 tablet (100 mg total) by mouth daily.  Indication:  Supplementation   tiotropium 18 MCG inhalation capsule Commonly known as:  SPIRIVA Place 1 capsule (18 mcg total) into inhaler and inhale daily. For COPD  Indication:  Chronic Obstructive Lung Disease   traZODone 100 MG tablet Commonly known as:  DESYREL Take 1 tablet (100 mg total) by mouth at bedtime and may repeat dose one time if needed. For sleep What changed:    when to take this  reasons to take this  additional instructions  Another medication with the same name was removed. Continue taking this medication, and follow the directions you see here.  Indication:  [place] Follow up on 10/13/2018.   Why:  Please attend your primary care appointment on Monday, 3/16 at 9:30a.  Be sure to bring your photo ID, proof of insurance, SSN, current medications, and discharge paperwork from this hospitalization. Contact information: [place] [phone] [place]  Follow up on 10/10/2018.   Specialty:  Behavioral Health Why:  Hospital follow up appointment is Friday, 3/13 at 8:30a.  Please bring your photo ID, proof of insurance, SSN, current medications and discharge paperwork from this hospitalization.  Contact  information: [place] [address] [phone]           Follow-up recommendations: Activity as tolerated. Diet as recommended by primary care physician. Keep all scheduled follow-up appointments as recommended.   Comments:   Patient is instructed to take all prescribed medications as recommended. Report any side effects or adverse reactions to your outpatient psychiatrist. Patient is instructed to abstain from alcohol and illegal drugs while on prescription medications. In the event of worsening symptoms, patient is instructed to call the crisis hotline, 911, or go to the nearest emergency department for evaluation and treatment.  Signed: Connye Burkitt, NP 10/08/2018, 8:20 AM

## 2018-10-08 NOTE — Progress Notes (Signed)
  [facility] Adult Case Management Discharge Plan :  Will you be returning to the same living situation after discharge:  No. Provided shelter resources, has an [employer] in person interview. At discharge, do you have transportation home?: Yes,  bus pass Do you have the ability to pay for your medications: Yes,  Medicaid and Social Security  Release of information consent forms completed and in the chart; bus pass on chart for patient. Patient to Follow up at: Follow-up [facility] Follow up on 10/13/2018.   Why:  Please attend your primary care appointment on Monday, 3/16 at 9:30a.  Be sure to bring your photo ID, proof of insurance, SSN, current medications, and discharge paperwork from this hospitalization. Contact information: [place] [phone] [place] Follow up on 10/10/2018.   Specialty:  Behavioral Health Why:  Hospital follow up appointment is Friday, 3/13 at 8:30a.  Please bring your photo ID, proof of insurance, SSN, current medications and discharge paperwork from this hospitalization.  Contact information: [place] [address] [phone]           Next level of care provider has access to [place] and Suicide Prevention discussed: Yes,  with patient     Has patient been referred to the Quitline?: Patient refused referral  Patient has been referred for addiction treatment: Yes  Joellen Jersey, [place] 10/08/2018, 10:36 AM

## 2018-10-08 NOTE — BHH Suicide Risk Assessment (Signed)
[facility] Discharge Suicide Risk Assessment   Principal Problem: MDD (major depressive disorder), recurrent severe, without psychosis ([place]) Discharge Diagnoses: Principal Problem:   MDD (major depressive disorder), recurrent severe, without psychosis ([place]) Active Problems:   Alcohol use   Total Time spent with patient: 30 minutes  Musculoskeletal: Strength & Muscle Tone: within normal limits Gait & Station: normal Patient leans: N/A  Psychiatric Specialty Exam: Review of Systems  Musculoskeletal: Positive for back pain, joint pain and myalgias.  All other systems reviewed and are negative.   Blood pressure 102/74, pulse (!) 122, temperature 97.8 F (36.6 C), temperature source Oral, resp. rate 18, height 5\' 5"  (1.651 m), weight 68 kg, SpO2 98 %.Body mass index is 24.96 kg/m.  General Appearance: Casual  Eye Contact::  Fair  Speech:  Normal Rate409  Volume:  Normal  Mood:  Euthymic  Affect:  Congruent  Thought Process:  Coherent and Descriptions of Associations: Intact  Orientation:  Full (Time, Place, and Person)  Thought Content:  Logical  Suicidal Thoughts:  No  Homicidal Thoughts:  No  Memory:  Immediate;   Fair Recent;   Fair Remote;   Fair  Judgement:  Intact  Insight:  Fair  Psychomotor Activity:  Normal  Concentration:  Fair  Recall:  [employer] of Knowledge:Fair  Language: Good  Akathisia:  Negative  Handed:  Right  AIMS (if indicated):     Assets:  Desire for Improvement Resilience  Sleep:  Number of Hours: 6  Cognition: WNL  ADL's:  Intact   Mental Status Per Nursing Assessment::   On Admission:  Suicidal ideation indicated by patient, Suicide plan, Self-harm thoughts, Intention to act on suicide plan, Plan includes specific time, place, or method, Self-harm behaviors, Belief that plan would result in death  Demographic Factors:  Male, Divorced or widowed, Caucasian, Low socioeconomic status, Living alone and Unemployed  Loss Factors: NA  Historical  Factors: Impulsivity  Risk Reduction Factors:   Positive coping skills or problem solving skills  Continued Clinical Symptoms:  Depression:   Comorbid alcohol abuse/dependence Impulsivity Alcohol/Substance Abuse/Dependencies  Cognitive Features That Contribute To Risk:  Thought constriction (tunnel vision)    Suicide Risk:  Minimal: No identifiable suicidal ideation.  Patients presenting with no risk factors but with morbid ruminations; may be classified as minimal risk based on the severity of the depressive symptoms  Follow-up [facility] Follow up on 10/13/2018.   Why:  Please attend your primary care appointment on Monday, 3/16 at 9:30a.  Be sure to bring your photo ID, proof of insurance, SSN, current medications, and discharge paperwork from this hospitalization. Contact information: [place] [phone] [place] Follow up on 10/10/2018.   Specialty:  Behavioral Health Why:  Hospital follow up appointment is Friday, 3/13 at 8:30a.  Please bring your photo ID, proof of insurance, SSN, current medications and discharge paperwork from this hospitalization.  Contact information: [place] [address] [phone]           Plan Of Care/Follow-up recommendations:  Activity:  ad lib  Sharma Covert, MD 10/08/2018, 7:39 AM

## 2018-10-08 NOTE — Progress Notes (Signed)
D: Pt A & O X 4. Presents with flat affect on initial approach but is animated, pleasant on interactions. Denies SI, HI, AVH and pain at this time. Ambulatory in milieu with a walker; gait is steady per pt's norm. Pt D/C home as ordered. Picked up by [employer] taxi to go to the [employer] on [address] and bus pass given for transportation.  A: D/C instructions reviewed with pt including prescriptions (faxed to [facility]) and follow up appointments; compliance encouraged. All belongings from locker # 33 given to pt at time of departure. Scheduled  medications administered with verbal education and effects monitored. Safety checks maintained without incident till time of d/c.  R: Pt receptive to care. Compliant with medications when offered. Denies adverse drug reactions when assessed. Verbalized understanding related to d/c instructions. Signed belonging sheet in agreement with items received from locker. Ambulatory with a steady gait. Appears to be in no physical distress at time of departure.

## 2018-10-09 ENCOUNTER — Encounter (HOSPITAL_COMMUNITY): Payer: Self-pay

## 2018-10-09 ENCOUNTER — Other Ambulatory Visit: Payer: Self-pay

## 2018-10-09 ENCOUNTER — Emergency Department (HOSPITAL_COMMUNITY): Payer: Medicaid Other

## 2018-10-09 ENCOUNTER — Emergency Department (HOSPITAL_COMMUNITY)
Admission: EM | Admit: 2018-10-09 | Discharge: 2018-10-09 | Disposition: A | Discharge status: Home / Self Care | Payer: Medicaid Other | Attending: Emergency Medicine | Admitting: Emergency Medicine

## 2018-10-09 DIAGNOSIS — W19XXXA Unspecified fall, initial encounter: Secondary | ICD-10-CM | POA: Diagnosis not present

## 2018-10-09 DIAGNOSIS — J449 Chronic obstructive pulmonary disease, unspecified: Secondary | ICD-10-CM | POA: Diagnosis not present

## 2018-10-09 DIAGNOSIS — Y939 Activity, unspecified: Secondary | ICD-10-CM | POA: Diagnosis not present

## 2018-10-09 DIAGNOSIS — S0181XA Laceration without foreign body of other part of head, initial encounter: Secondary | ICD-10-CM

## 2018-10-09 DIAGNOSIS — I10 Essential (primary) hypertension: Secondary | ICD-10-CM | POA: Insufficient documentation

## 2018-10-09 DIAGNOSIS — S01112A Laceration without foreign body of left eyelid and periocular area, initial encounter: Secondary | ICD-10-CM | POA: Diagnosis not present

## 2018-10-09 DIAGNOSIS — R464 Slowness and poor responsiveness: Secondary | ICD-10-CM | POA: Diagnosis present

## 2018-10-09 DIAGNOSIS — Y999 Unspecified external cause status: Secondary | ICD-10-CM | POA: Diagnosis not present

## 2018-10-09 DIAGNOSIS — Z79899 Other long term (current) drug therapy: Secondary | ICD-10-CM | POA: Diagnosis not present

## 2018-10-09 DIAGNOSIS — R040 Epistaxis: Secondary | ICD-10-CM | POA: Diagnosis not present

## 2018-10-09 DIAGNOSIS — Z5900 Homelessness unspecified: Secondary | ICD-10-CM

## 2018-10-09 DIAGNOSIS — F17210 Nicotine dependence, cigarettes, uncomplicated: Secondary | ICD-10-CM | POA: Insufficient documentation

## 2018-10-09 DIAGNOSIS — F1010 Alcohol abuse, uncomplicated: Secondary | ICD-10-CM | POA: Diagnosis not present

## 2018-10-09 DIAGNOSIS — Y92410 Unspecified street and highway as the place of occurrence of the external cause: Secondary | ICD-10-CM | POA: Insufficient documentation

## 2018-10-09 LAB — COMPREHENSIVE METABOLIC PANEL
ALT: 21 U/L (ref 0–44)
AST: 33 U/L (ref 15–41)
Albumin: 3.9 g/dL (ref 3.5–5.0)
Alkaline Phosphatase: 62 U/L (ref 38–126)
Anion gap: 9 (ref 5–15)
BUN: 7 mg/dL — ABNORMAL LOW (ref 8–23)
CHLORIDE: 108 mmol/L (ref 98–111)
CO2: 22 mmol/L (ref 22–32)
Calcium: 8.4 mg/dL — ABNORMAL LOW (ref 8.9–10.3)
Creatinine, Ser: 0.74 mg/dL (ref 0.61–1.24)
GFR calc Af Amer: >60 mL/min (ref >60)
GFR calc non Af Amer: >60 mL/min (ref >60)
Glucose, Bld: 92 mg/dL (ref 70–99)
Potassium: 4 mmol/L (ref 3.5–5.1)
Sodium: 139 mmol/L (ref 135–145)
Total Bilirubin: 0.3 mg/dL (ref 0.3–1.2)
Total Protein: 7.1 g/dL (ref 6.5–8.1)

## 2018-10-09 LAB — CBC WITH DIFFERENTIAL/PLATELET
Abs Immature Granulocytes: 0.1 10*3/uL — ABNORMAL HIGH (ref 0.00–0.07)
BASOS ABS: 0 10*3/uL (ref 0.0–0.1)
Basophils Relative: 0 %
Eosinophils Absolute: 0 10*3/uL (ref 0.0–0.5)
Eosinophils Relative: 0 %
HCT: 42.1 % (ref 39.0–52.0)
Hemoglobin: 12.9 g/dL — ABNORMAL LOW (ref 13.0–17.0)
IMMATURE GRANULOCYTES: 1 %
LYMPHS PCT: 33 %
Lymphs Abs: 2.6 10*3/uL (ref 0.7–4.0)
MCH: 26.8 pg (ref 26.0–34.0)
MCHC: 30.6 g/dL (ref 30.0–36.0)
MCV: 87.3 fL (ref 80.0–100.0)
Monocytes Absolute: 0.7 10*3/uL (ref 0.1–1.0)
Monocytes Relative: 8 %
Neutro Abs: 4.5 10*3/uL (ref 1.7–7.7)
Neutrophils Relative %: 58 %
Platelets: 374 10*3/uL (ref 150–400)
RBC: 4.82 MIL/uL (ref 4.22–5.81)
RDW: 17.9 % — ABNORMAL HIGH (ref 11.5–15.5)
WBC: 7.9 10*3/uL (ref 4.0–10.5)
nRBC: 0 % (ref 0.0–0.2)

## 2018-10-09 LAB — URINALYSIS, ROUTINE W REFLEX MICROSCOPIC
BILIRUBIN URINE: NEGATIVE
Bacteria, UA: NONE SEEN
Glucose, UA: NEGATIVE mg/dL
KETONES UR: NEGATIVE mg/dL
LEUKOCYTE UA: NEGATIVE
Nitrite: NEGATIVE
Protein, ur: NEGATIVE mg/dL
Specific Gravity, Urine: 1.005 (ref 1.005–1.030)
pH: 5 (ref 5.0–8.0)

## 2018-10-09 LAB — ETHANOL: Alcohol, Ethyl (B): 324 mg/dL (ref <10)

## 2018-10-09 MED ORDER — ACETAMINOPHEN 500 MG PO TABS
1000.0000 mg | ORAL_TABLET | Freq: Once | ORAL | Status: AC
  Administered 2018-10-09: 1000 mg via ORAL
  Filled 2018-10-09: qty 2

## 2018-10-09 MED ORDER — SODIUM CHLORIDE 0.9 % IV BOLUS
1000.0000 mL | Freq: Once | INTRAVENOUS | Status: AC
  Administered 2018-10-09: 1000 mL via INTRAVENOUS

## 2018-10-09 NOTE — ED Notes (Signed)
Pt continuously stating "just shoot me in the head, I dont wanna be here anymore"

## 2018-10-09 NOTE — ED Notes (Signed)
Pt ambulated in hall steady gait, denied dizziness/lightheadedness. EDP aware
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
## 2018-10-09 NOTE — ED Notes (Signed)
Pt in CT.
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
## 2018-10-09 NOTE — ED Notes (Signed)
EDP aware pt was reporting "heart pain"

## 2018-10-09 NOTE — ED Notes (Signed)
Pt urinated while in Xray, will continue to monitor for urine sample.

## 2018-10-11 ENCOUNTER — Emergency Department (HOSPITAL_COMMUNITY): Payer: Medicaid Other

## 2018-10-11 ENCOUNTER — Encounter (HOSPITAL_COMMUNITY): Payer: Self-pay | Admitting: Emergency Medicine

## 2018-10-11 ENCOUNTER — Emergency Department (HOSPITAL_COMMUNITY)
Admission: EM | Admit: 2018-10-11 | Discharge: 2018-10-12 | Disposition: A | Discharge status: Home / Self Care | Payer: Medicaid Other | Attending: Emergency Medicine | Admitting: Emergency Medicine

## 2018-10-11 ENCOUNTER — Other Ambulatory Visit: Payer: Self-pay

## 2018-10-11 DIAGNOSIS — F1010 Alcohol abuse, uncomplicated: Secondary | ICD-10-CM | POA: Diagnosis not present

## 2018-10-11 DIAGNOSIS — F17210 Nicotine dependence, cigarettes, uncomplicated: Secondary | ICD-10-CM | POA: Diagnosis not present

## 2018-10-11 DIAGNOSIS — R51 Headache: Secondary | ICD-10-CM | POA: Diagnosis not present

## 2018-10-11 DIAGNOSIS — J449 Chronic obstructive pulmonary disease, unspecified: Secondary | ICD-10-CM | POA: Diagnosis not present

## 2018-10-11 DIAGNOSIS — S298XXA Other specified injuries of thorax, initial encounter: Secondary | ICD-10-CM

## 2018-10-11 DIAGNOSIS — Z79899 Other long term (current) drug therapy: Secondary | ICD-10-CM | POA: Insufficient documentation

## 2018-10-11 DIAGNOSIS — R45851 Suicidal ideations: Secondary | ICD-10-CM | POA: Diagnosis not present

## 2018-10-11 DIAGNOSIS — R079 Chest pain, unspecified: Secondary | ICD-10-CM | POA: Insufficient documentation

## 2018-10-11 DIAGNOSIS — I2510 Atherosclerotic heart disease of native coronary artery without angina pectoris: Secondary | ICD-10-CM | POA: Insufficient documentation

## 2018-10-11 DIAGNOSIS — I10 Essential (primary) hypertension: Secondary | ICD-10-CM | POA: Diagnosis not present

## 2018-10-11 DIAGNOSIS — Z9114 Patient's other noncompliance with medication regimen: Secondary | ICD-10-CM | POA: Insufficient documentation

## 2018-10-11 DIAGNOSIS — F329 Major depressive disorder, single episode, unspecified: Secondary | ICD-10-CM | POA: Diagnosis not present

## 2018-10-11 DIAGNOSIS — Z590 Homelessness: Secondary | ICD-10-CM | POA: Diagnosis not present

## 2018-10-11 DIAGNOSIS — Z23 Encounter for immunization: Secondary | ICD-10-CM | POA: Insufficient documentation

## 2018-10-11 LAB — COMPREHENSIVE METABOLIC PANEL
ALT: 29 U/L (ref 0–44)
AST: 62 U/L — ABNORMAL HIGH (ref 15–41)
Albumin: 4 g/dL (ref 3.5–5.0)
Alkaline Phosphatase: 76 U/L (ref 38–126)
Anion gap: 15 (ref 5–15)
BUN: 11 mg/dL (ref 8–23)
CO2: 21 mmol/L — ABNORMAL LOW (ref 22–32)
Calcium: 8.5 mg/dL — ABNORMAL LOW (ref 8.9–10.3)
Chloride: 93 mmol/L — ABNORMAL LOW (ref 98–111)
Creatinine, Ser: 0.64 mg/dL (ref 0.61–1.24)
GFR calc Af Amer: >60 mL/min (ref >60)
GFR calc non Af Amer: >60 mL/min (ref >60)
Glucose, Bld: 102 mg/dL — ABNORMAL HIGH (ref 70–99)
Potassium: 3.5 mmol/L (ref 3.5–5.1)
Sodium: 129 mmol/L — ABNORMAL LOW (ref 135–145)
Total Bilirubin: 0.6 mg/dL (ref 0.3–1.2)
Total Protein: 7.1 g/dL (ref 6.5–8.1)

## 2018-10-11 LAB — CBC
HCT: 34.2 % — ABNORMAL LOW (ref 39.0–52.0)
HEMOGLOBIN: 11.1 g/dL — AB (ref 13.0–17.0)
MCH: 28 pg (ref 26.0–34.0)
MCHC: 32.5 g/dL (ref 30.0–36.0)
MCV: 86.1 fL (ref 80.0–100.0)
Platelets: 302 10*3/uL (ref 150–400)
RBC: 3.97 MIL/uL — ABNORMAL LOW (ref 4.22–5.81)
RDW: 17.3 % — ABNORMAL HIGH (ref 11.5–15.5)
WBC: 14.3 10*3/uL — ABNORMAL HIGH (ref 4.0–10.5)
nRBC: 0 % (ref 0.0–0.2)

## 2018-10-11 LAB — ETHANOL: Alcohol, Ethyl (B): 116 mg/dL — ABNORMAL HIGH (ref <10)
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
## 2018-10-11 NOTE — ED Triage Notes (Signed)
Vital signs per EMS BP: 158/92 HR: 108 rsp: 20 sat: 100% RA

## 2018-10-11 NOTE — ED Notes (Signed)
Pt is currently homeless sleeping in parks.States he has drank 10 beers due to son's anniversary death today. Plans to harm himself by running out into traffic.

## 2018-10-12 ENCOUNTER — Other Ambulatory Visit: Payer: Self-pay

## 2018-10-12 ENCOUNTER — Emergency Department (HOSPITAL_COMMUNITY)
Admission: EM | Admit: 2018-10-12 | Discharge: 2018-10-12 | Disposition: A | Discharge status: Home / Self Care | Payer: Medicaid Other | Source: Home / Self Care | Attending: Emergency Medicine | Admitting: Emergency Medicine

## 2018-10-12 ENCOUNTER — Encounter (HOSPITAL_COMMUNITY): Payer: Self-pay

## 2018-10-12 ENCOUNTER — Emergency Department (HOSPITAL_COMMUNITY): Payer: Medicaid Other

## 2018-10-12 DIAGNOSIS — F32A Depression, unspecified: Secondary | ICD-10-CM

## 2018-10-12 DIAGNOSIS — J449 Chronic obstructive pulmonary disease, unspecified: Secondary | ICD-10-CM | POA: Insufficient documentation

## 2018-10-12 DIAGNOSIS — Z23 Encounter for immunization: Secondary | ICD-10-CM

## 2018-10-12 DIAGNOSIS — Z9114 Patient's other noncompliance with medication regimen: Secondary | ICD-10-CM

## 2018-10-12 DIAGNOSIS — F17210 Nicotine dependence, cigarettes, uncomplicated: Secondary | ICD-10-CM | POA: Insufficient documentation

## 2018-10-12 DIAGNOSIS — F1010 Alcohol abuse, uncomplicated: Secondary | ICD-10-CM | POA: Insufficient documentation

## 2018-10-12 DIAGNOSIS — R45851 Suicidal ideations: Secondary | ICD-10-CM

## 2018-10-12 DIAGNOSIS — I2510 Atherosclerotic heart disease of native coronary artery without angina pectoris: Secondary | ICD-10-CM | POA: Insufficient documentation

## 2018-10-12 DIAGNOSIS — R51 Headache: Secondary | ICD-10-CM | POA: Insufficient documentation

## 2018-10-12 DIAGNOSIS — I10 Essential (primary) hypertension: Secondary | ICD-10-CM

## 2018-10-12 DIAGNOSIS — F329 Major depressive disorder, single episode, unspecified: Secondary | ICD-10-CM | POA: Insufficient documentation

## 2018-10-12 DIAGNOSIS — Z79899 Other long term (current) drug therapy: Secondary | ICD-10-CM | POA: Insufficient documentation

## 2018-10-12 DIAGNOSIS — Z590 Homelessness: Secondary | ICD-10-CM

## 2018-10-12 DIAGNOSIS — F1094 Alcohol use, unspecified with alcohol-induced mood disorder: Secondary | ICD-10-CM

## 2018-10-12 LAB — COMPREHENSIVE METABOLIC PANEL
ALT: 31 U/L (ref 0–44)
AST: 63 U/L — ABNORMAL HIGH (ref 15–41)
Albumin: 4 g/dL (ref 3.5–5.0)
Alkaline Phosphatase: 80 U/L (ref 38–126)
Anion gap: 12 (ref 5–15)
BUN: 10 mg/dL (ref 8–23)
CO2: 25 mmol/L (ref 22–32)
Calcium: 8.9 mg/dL (ref 8.9–10.3)
Chloride: 92 mmol/L — ABNORMAL LOW (ref 98–111)
Creatinine, Ser: 0.72 mg/dL (ref 0.61–1.24)
GFR calc Af Amer: >60 mL/min (ref >60)
GFR calc non Af Amer: >60 mL/min (ref >60)
Glucose, Bld: 118 mg/dL — ABNORMAL HIGH (ref 70–99)
Potassium: 3.9 mmol/L (ref 3.5–5.1)
Sodium: 129 mmol/L — ABNORMAL LOW (ref 135–145)
TOTAL PROTEIN: 7.5 g/dL (ref 6.5–8.1)
Total Bilirubin: 1.1 mg/dL (ref 0.3–1.2)

## 2018-10-12 LAB — CBC
HCT: 35.9 % — ABNORMAL LOW (ref 39.0–52.0)
Hemoglobin: 11.5 g/dL — ABNORMAL LOW (ref 13.0–17.0)
MCH: 27.3 pg (ref 26.0–34.0)
MCHC: 32 g/dL (ref 30.0–36.0)
MCV: 85.1 fL (ref 80.0–100.0)
Platelets: 331 10*3/uL (ref 150–400)
RBC: 4.22 MIL/uL (ref 4.22–5.81)
RDW: 17.2 % — ABNORMAL HIGH (ref 11.5–15.5)
WBC: 11.9 10*3/uL — ABNORMAL HIGH (ref 4.0–10.5)
nRBC: 0 % (ref 0.0–0.2)

## 2018-10-12 LAB — RAPID URINE DRUG SCREEN, HOSP PERFORMED
Amphetamines: NOT DETECTED
Barbiturates: NOT DETECTED
Benzodiazepines: POSITIVE — AB
Cocaine: NOT DETECTED
Opiates: NOT DETECTED
Tetrahydrocannabinol: NOT DETECTED

## 2018-10-12 LAB — ETHANOL: Alcohol, Ethyl (B): <10 mg/dL (ref <10)

## 2018-10-12 LAB — LIPASE, BLOOD: Lipase: 23 U/L (ref 11–51)

## 2018-10-12 MED ORDER — FOLIC ACID 1 MG PO TABS
1.0000 mg | ORAL_TABLET | Freq: Every day | ORAL | Status: DC
  Administered 2018-10-12: 1 mg via ORAL
  Filled 2018-10-12: qty 1

## 2018-10-12 MED ORDER — ADULT MULTIVITAMIN W/MINERALS CH
1.0000 | ORAL_TABLET | Freq: Every day | ORAL | Status: DC
  Administered 2018-10-12: 1 via ORAL
  Filled 2018-10-12: qty 1

## 2018-10-12 MED ORDER — LORAZEPAM 1 MG PO TABS
1.0000 mg | ORAL_TABLET | Freq: Four times a day (QID) | ORAL | Status: DC | PRN
  Administered 2018-10-12: 1 mg via ORAL
  Filled 2018-10-12: qty 1

## 2018-10-12 MED ORDER — LORAZEPAM 1 MG PO TABS: 0.0000 mg | ORAL_TABLET | Freq: Two times a day (BID) | ORAL | Status: DC

## 2018-10-12 MED ORDER — VITAMIN B-1 100 MG PO TABS
100.0000 mg | ORAL_TABLET | Freq: Every day | ORAL | Status: DC
  Administered 2018-10-12: 100 mg via ORAL
  Filled 2018-10-12: qty 1

## 2018-10-12 MED ORDER — THIAMINE HCL 100 MG/ML IJ SOLN
100.0000 mg | Freq: Every day | INTRAMUSCULAR | Status: DC
  Filled 2018-10-12: qty 2

## 2018-10-12 MED ORDER — SODIUM CHLORIDE 0.9 % IV BOLUS (SEPSIS)
1000.0000 mL | Freq: Once | INTRAVENOUS | Status: AC
  Administered 2018-10-12: 1000 mL via INTRAVENOUS

## 2018-10-12 MED ORDER — GABAPENTIN 300 MG PO CAPS
300.0000 mg | ORAL_CAPSULE | Freq: Two times a day (BID) | ORAL | Status: DC
  Administered 2018-10-12: 300 mg via ORAL
  Filled 2018-10-12: qty 1

## 2018-10-12 MED ORDER — LORAZEPAM 2 MG/ML IJ SOLN: 1.0000 mg | Freq: Four times a day (QID) | INTRAMUSCULAR | Status: DC | PRN

## 2018-10-12 MED ORDER — TETANUS-DIPHTH-ACELL PERTUSSIS 5-2.5-18.5 LF-MCG/0.5 IM SUSP
0.5000 mL | Freq: Once | INTRAMUSCULAR | Status: AC
  Administered 2018-10-12: 0.5 mL via INTRAMUSCULAR
  Filled 2018-10-12: qty 0.5

## 2018-10-12 MED ORDER — LORAZEPAM 1 MG PO TABS
0.0000 mg | ORAL_TABLET | Freq: Four times a day (QID) | ORAL | Status: DC
  Administered 2018-10-12: 1 mg via ORAL
  Filled 2018-10-12: qty 1

## 2018-10-12 NOTE — Patient Outreach (Signed)
CPSS met with the patient to provide substance use recovery support with lived experience from CPSS and help with getting the patient connected to substance use treatment resources. Patient has a history of alcohol addiction. Patient was interested in [facility] and long-term substance use treatment. Patient was discharged from [facility] on 10/08/18 and planned to follow up with an [facility] interview. Patient informed CPSS that he was unable to make this [facility] interview and does not remember which [facility] he was suppose to follow up with. CPSS provided several different substance use recovery resources including AA meeting list, [facility] list, outpatient/residential substance use treatment center list, [facility] list, [place] bus passes, homeless shelter list, and CPSS contact information. CPSS strongly encouraged the patient to continue to stay in contact with CPSS for further substance use recovery support or help with getting connected to substance use treatment resources after discharge from the [facility].   

## 2018-10-12 NOTE — Discharge Instructions (Addendum)
Based on review of vitals, medical screening exam, lab work and/or imaging, there does not appear to be an acute, emergent etiology for the patient's symptoms. Counseled pt on good return precautions and encouraged both PCP and ED follow-up as needed.  Prior to discharge, I also discussed incidental imaging findings with patient in detail and advised appropriate, recommended follow-up in detail.  Clinical Impression: 1. Alcohol abuse   2. Depression, unspecified depression type     Disposition: Discharge  Prior to providing a prescription for a controlled substance, I independently reviewed the patient's recent prescription history on the [place]. The patient had no recent or regular prescriptions and was deemed appropriate for a brief, less than 3 day prescription of narcotic for acute analgesia.  This note was prepared with assistance of Systems analyst. Occasional wrong-word or sound-a-like substitutions may have occurred due to the inherent limitations of voice recognition software.

## 2018-10-12 NOTE — ED Provider Notes (Addendum)
[facility] DEPT Provider Note   CSN: [id number] Arrival date & time: 10/12/18  0302    History   Chief Complaint Chief Complaint  Patient presents with  . Alcohol Problem    HPI Samuel Reyes is a 63 y.o. male.     Patient is a 63 year old alcoholic with past medical history of coronary artery disease, COPD, depression, GERD, hypertension.  He comes to the emergency department today for complaints of vomiting and epigastric pain.  Reports that he drank a 12 pack of beer yesterday and this is when his symptoms started.  He has been seen several times in the emergency department in the last month related to alcoholism, suicidal ideation, and homelessness.  He was evaluated by psychiatry who was to set him up with [facility].  Patient reports that he does not remember anything about this and that he has been sleeping in a park.  He reports that he thinks about killing himself with the plan to step out in the middle of the road on [address].  He reports "those cars drive pretty fast to there".  Reports he is not taking any medications at home.  Denies any shortness of breath, hematemesis, hemoptysis, diarrhea, fever, chills.  He denies any symptoms of alcohol withdrawal.     Past Medical History:  Diagnosis Date  . Alcoholism ([place])   . CAD (coronary artery disease) 2002   MI, no intervention required  . COPD (chronic obstructive pulmonary disease) (HCC)    not on home O2  . Depression   . GERD (gastroesophageal reflux disease)   . Headache   . Hypertension   . Stroke [facility]) 2006    Patient Active Problem List   Diagnosis Date Noted  . QT prolongation 09/09/2018  . Overdose 09/06/2018  . MDD (major depressive disorder), recurrent episode, severe ([place]) 07/18/2018  . MDD (major depressive disorder), recurrent severe, without psychosis ([place]) 08/26/2017  . Alcohol withdrawal ([place]) 08/08/2017  . COPD with acute bronchitis ([place])  07/29/2017  . Alcohol use   . Homelessness 06/24/2017  . Lung nodule 09/03/2016  . Tobacco abuse 09/03/2016  . Insomnia 09/03/2016  . HCAP (healthcare-associated pneumonia) 08/22/2016  . Bronchiolitis 08/21/2016  . Malnutrition of moderate degree 08/04/2016  . Atrial tachycardia ([place]) 08/02/2016  . Impaired glucose tolerance 08/02/2016  . Alcohol-induced depressive disorder with moderate or severe use disorder ([place]) 12/12/2015  . Syncope 11/15/2015  . Chest pain 11/15/2015  . Nausea vomiting and diarrhea 11/15/2015  . Abdominal pain 11/15/2015  . Major depressive disorder, recurrent episode, moderate with anxious distress ([place]) 10/30/2015  . Alcohol use disorder, severe, dependence ([place]) 10/29/2015  . COPD exacerbation ([place]) 09/20/2015  . Acute respiratory failure with hypoxia ([place]) 09/18/2015  . Suicidal ideation 09/17/2015  . Alcohol intoxication ([place]) 09/17/2015  . Depression 09/17/2015  . Benign essential HTN 09/17/2015  . Hypokalemia 09/17/2015  . Hyponatremia 09/17/2015  . Coffee ground emesis 09/17/2015  . COPD (chronic obstructive pulmonary disease) ([place]) 02/17/2013  . GERD (gastroesophageal reflux disease) 02/17/2013    Past Surgical History:  Procedure Laterality Date  . BACK SURGERY     3 cervical spine surgeries C4-C5 fused  . COLONOSCOPY N/A 01/04/2014   Procedure: COLONOSCOPY;  Surgeon: Danie Binder, MD;  [facility];  Service: Endoscopy;  Laterality: N/A;  1:45  . ESOPHAGOGASTRODUODENOSCOPY N/A 01/04/2014   Procedure: ESOPHAGOGASTRODUODENOSCOPY (EGD);  Surgeon: Danie Binder, MD;  Location[facility];  Service: Endoscopy;  Laterality:  N/A;  . FINGER SURGERY Left    2nd, 3rd, & 4th fingers were cut off by table saw and reattached  . GASTRECTOMY    . HERNIA REPAIR    . INCISIONAL HERNIA REPAIR N/A 01/20/2014   Procedure: LAPAROSCOPIC RECURRENT  INCISIONAL HERNIA with mesh;  Surgeon: Edward Jolly, MD;  Location: [facility] ORS;  Service: General;   Laterality: N/A;  . rt knee arthroscopic surgery    . SHOULDER SURGERY Bilateral    3 surgeries on on left, 2 surgeries on right         Home Medications    Prior to Admission medications   Medication Sig Start Date End Date Taking? Authorizing Provider  acetaminophen (TYLENOL) 325 MG tablet Take 2 tablets (650 mg total) by mouth every 6 (six) hours as needed for mild pain. 10/08/18   Connye Burkitt, NP  albuterol (PROVENTIL HFA;VENTOLIN HFA) 108 (90 Base) MCG/ACT inhaler Inhale 2 puffs into the lungs every 4 (four) hours as needed for wheezing or shortness of breath. 07/22/18   Lindell Spar I, NP  folic acid (FOLVITE) 1 MG tablet Take 1 mg by mouth daily. 06/18/18 08/01/19  [provider]  Multiple Vitamin (MULTIVITAMIN WITH MINERALS) TABS tablet Take 1 tablet by mouth daily. Vitamin supplementation Patient not taking: Reported on 09/06/2018 07/23/18   Lindell Spar I, NP  nicotine (NICODERM CQ - DOSED IN MG/24 HOURS) 21 mg/24hr patch Place 1 patch (21 mg total) onto the skin daily. For smoking cessation 10/08/18   Connye Burkitt, NP  pantoprazole (PROTONIX) 40 MG tablet Take 1 tablet (40 mg total) by mouth daily. For reflux 10/08/18   Connye Burkitt, NP  prazosin (MINIPRESS) 1 MG capsule Take 1 capsule (1 mg total) by mouth at bedtime. For nightmares 10/08/18   Connye Burkitt, NP  sertraline (ZOLOFT) 50 MG tablet Take 1 tablet (50 mg total) by mouth daily. For mood 10/08/18   Connye Burkitt, NP  thiamine 100 MG tablet Take 1 tablet (100 mg total) by mouth daily. Patient not taking: Reported on 10/03/2018 09/12/18   Rai, Vernelle Emerald, MD  tiotropium (SPIRIVA) 18 MCG inhalation capsule Place 1 capsule (18 mcg total) into inhaler and inhale daily. For COPD 07/23/18   Lindell Spar I, NP  traZODone (DESYREL) 100 MG tablet Take 1 tablet (100 mg total) by mouth at bedtime and may repeat dose one time if needed. For sleep 10/08/18   Connye Burkitt, NP    Family History Family History  Problem  Relation Age of Onset  . Cancer Father        bone  . Cancer Brother        lungs  . Stroke Maternal Grandmother   . Asthma Son        died at age 88 in his sleep   . Spina bifida Son        died at age 49   . Dementia Mother   . Colon cancer Neg Hx     Social History Social History   Tobacco Use  . Smoking status: Current Every Day Smoker    Packs/day: 2.00    Years: 52.00    Pack years: 104.00    Types: Cigarettes    Start date: 07/30/1966  . Smokeless tobacco: Never Used  . Tobacco comment: down to 1/4 ppd  Substance Use Topics  . Alcohol use: Yes    Alcohol/week: 3.0 - 4.0 standard drinks    Types: 3 -  4 Cans of beer per week    Comment: 2 cases of beer  . Drug use: No    Comment: denied using any drugs     Allergies   Bee venom; Penicillins; and Vancomycin   Review of Systems Review of Systems  Constitutional: Negative for activity change, appetite change, chills, fatigue and fever.  HENT: Negative for congestion, ear pain, sneezing and sore throat.   Eyes: Negative for pain and visual disturbance.  Respiratory: Negative for cough and shortness of breath.   Cardiovascular: Negative for chest pain and palpitations.  Gastrointestinal: Positive for abdominal pain, nausea and vomiting.  Genitourinary: Negative for dysuria and hematuria.  Musculoskeletal: Negative for arthralgias and back pain.  Skin: Negative for color change and rash.  Neurological: Negative for dizziness, seizures, syncope and headaches.  Psychiatric/Behavioral: Positive for sleep disturbance and suicidal ideas. Negative for behavioral problems and self-injury.  All other systems reviewed and are negative.    Physical Exam Updated Vital Signs BP (!) 125/104   Pulse 99   Temp 98 F (36.7 C) (Oral)   Resp 16   SpO2 92%   Physical Exam   ED Treatments / Results  Labs (all labs ordered are listed, but only abnormal results are displayed) Labs Reviewed  COMPREHENSIVE METABOLIC PANEL  - Abnormal; Notable for the following components:      Result Value   Sodium 129 (*)    Chloride 92 (*)    Glucose, Bld 118 (*)    AST 63 (*)    All other components within normal limits  CBC - Abnormal; Notable for the following components:   WBC 11.9 (*)    Hemoglobin 11.5 (*)    HCT 35.9 (*)    RDW 17.2 (*)    All other components within normal limits  RAPID URINE DRUG SCREEN, HOSP PERFORMED - Abnormal; Notable for the following components:   Benzodiazepines POSITIVE (*)    All other components within normal limits  ETHANOL  LIPASE, BLOOD  CBG MONITORING, ED    EKG EKG Interpretation  Date/Time:  Sunday October 12 2018 07:10:04 EDT Ventricular Rate:  102 PR Interval:    QRS Duration: 96 QT Interval:  366 QTC Calculation: 477 R Axis:   67 Text Interpretation:  Sinus tachycardia Abnormal R-wave progression, early transition Borderline prolonged QT interval Confirmed by Orpah Greek [phone]) on 10/14/2018 8:09:25 AM   Radiology No results found.  Procedures Procedures (including critical care time)  Medications Ordered in ED Medications  sodium chloride 0.9 % bolus 1,000 mL (0 mLs Intravenous Stopped 10/12/18 1021)  Tdap (BOOSTRIX) injection 0.5 mL (0.5 mLs Intramuscular Given 10/12/18 0650)     Initial Impression / Assessment and Plan / ED Course  I have reviewed the triage vital signs and the nursing notes.  Pertinent labs & imaging results that were available during my care of the patient were reviewed by me and considered in my medical decision making (see chart for details).  Clinical Course as of Oct 19 1233  Sun Oct 12, 2018  0735 Patient has had no instances of vomiting here in the ED.  He has gotten Ativan and fluids and is resting comfortably.  Due to his suicidal ideation I will obtain a psych consult at this time.  He is medically cleared.   [KM]  1914 Psych consult obtained and he is psychiatrically cleared.   [KM]    Clinical Course User  Index [KM] Alveria Apley, PA-C  Final Clinical Impressions(s) / ED Diagnoses   Final diagnoses:  Alcohol abuse  Depression, unspecified depression type    ED Discharge Orders    None       Kristine Royal 10/12/18 1607    Ripley Fraise, MD 10/12/18 2340    Alveria Apley, PA-C 10/20/18 1235    Ripley Fraise, MD 10/20/18 2310

## 2018-10-12 NOTE — ED Triage Notes (Signed)
Pt reports that he drank half a case of beer within the last 3-4 hours and started throwing up. He is concerned with alcohol poisoning and states that he never drinks. Pt has a lac above his L eyebrow- bleeding controlled. He states that he fell earlier. No neuro deficits. A&Ox4. Steadily ambulatory.

## 2018-10-12 NOTE — ED Notes (Signed)
Patient given discharge teaching and verbalized understanding. Patient ambulated out of ED with a steady gait. 

## 2018-10-12 NOTE — Progress Notes (Addendum)
Patient ID: Samuel Reyes, male   DOB: November 27, 1955, 63 y.o.   MRN: [id number]  Pt was seen and chart reviewed with treatment team and Dr Darleene Cleaver. Pt is well known to this emergency room system. He has a history of alcoholism. He was discharged from [facility] on 3/11 and was to follow up with [facility] on 3/13 and [facility] on 3/16 at 0930. Pt is homeless. His BAL and UDS are negative on admission. Pt has a history of COPD. When treatment team saw Pt today he was still on a tele-monitor. Pt will be seen by Peer Support again today. Pt will receive instructions to follow up outpatient at [facility] and [facility].  Pt is psychiatrically clear.   Ethelene Hal,  FNP-C 10/12/2018         1230 Patient seen face-to-face for psychiatric evaluation, chart reviewed and case discussed with the physician extender and developed treatment plan. Reviewed the information documented and agree with the treatment plan. Corena Pilgrim, MD

## 2018-10-12 NOTE — Discharge Instructions (Addendum)
Substance Abuse Treatment Programs ° °Intensive Outpatient Programs °[facility]     °[address]      °[place], [place]                   °[phone]      ° °[facility] °[address] °[place], [place] °336-379-7146 ° °[facility]     °(Inpatient and outpatient)     °[address]           °336-832-9800   ° °[facility] °336-288-1484 (Suboxone and Methadone) ° °[address]      °[place], [place] [address]      °336-882-2125      ° °[address] °[place], [place] °852-3033 ° °[facility] (Outpatient/Inpatient, [facility])    °(insurance only) [phone]      °       °[facility] (Groups & Residential) °[place], [place] °336-389-1413 ° °   °[facility]     °[address]     °[place], [place]      °336-389-1413      ° °[facility] (for caregivers and family) °[address] °[place], [place] °336-299-4655 ° ° ° ° ° °Residential Treatment Programs °[facility]      °[address], [place], [place] [address]  °([phone]      ° °[facility] °[address] [place], [place] [address] °919-419-1059 ° °[facility]        °336-248-8914      ° °[facility] °1-800-659-3381 ° °[facility]             °[address]                                         °[place], [place]                                                °877-615-2722 or 336-784-9470                              ° °[facility] [place] °[address] °[place], [address] °1.877.941.8954 ° °[facility]    °[address]      °[place], [place]     °336-273-5306      ° °[facility] °[address] °[place], [place] °336-285-9073 ° °[facility]   °[address]     °[place], [place] [address]     °336-899-1550      °Admissions: 8am-3pm M-F ° °[facility] °[address] °[place],  [place] °336-227-7417 ° °[facility]: Residential Program (90 Days)   °[place], [place]      °336-725-8389 or 800-758-6077    ° °[facility] °[place], [place] °(Walk in Hours over the weekend or by referral) ° °[facility] °[address], [place],  [address] °(336) [phone] ° °Crisis Mobile: [facility]:  1-877-626-1772 (for crisis response 24 hours a day) °[facility] Hotline:      1-800-256-2452 °Outpatient Psychiatry and Counseling ° °[facility]: Mobile Crisis   Management 24 hours:  1-877-626-1772 ° °[facility] sliding scale fee and walk in schedule: M-F 8am-12pm/1pm-3pm °[address]  °[place], [place] [address] °[phone] ° °[facility] °[address] °[place], [place] [address] °336-703-9650 ° °[facility] ([facility])- new patient walk-in appointments available Monday - Friday 8am -3pm.          °[address] °[place], [place] [address] °336-676-6840 or crisis line- 336-676-6905 ° °[facility] Intensive Outpatient Therapy Program °[address] °[place], [place] [address] °336-832-9804 ° °[facility]                  °Crisis Services      °336.641.4993      °[address]     °[place], [place] [address]                ° °[facility]   °[facility] °[phone] °[address] °[place], [place] [address] ° ° °[facility]          °[address],  °[place], [place] [address]       °(336) [phone] ° °[facility] °[address] °[place], [place] [address] °336-292-1510 ° °[facility]    °[address]    °[place], [place] [address]     °336-632-3505      ° °Parish McKinney, MD     °[address]     °[place] [address]     °336-282-1251     °  °[facility] °[address] °[place] [address] ° °[facility]     °[address]     °[place], [place]      °336-542-2076      ° °[facility] °Shamsher Ahluwalia, MD °[address] °[place], [place] [address] °336-420-9558 ° °[facility]     °[address]     °[place], [place] [address]     °336-274-1237      ° °[facility] °[address] °[place], [place] [address] °336-854-4450 °Resources for Temporary Residential Assistance/Crisis Centers ° °DAY CENTERS °[facility] °M-F 8am-3pm   °[address] [place], [place] [address]   336-332-0824 °Services include: laundry, barbering, support groups, case management, phone  & computer access, showers, AA/NA mtgs, mental health/substance abuse nurse, job skills class, disability information, VA assistance, spiritual classes, etc.  ° °HOMELESS SHELTERS ° °[facility]     °[facility]   °[address], [place]     °336.271.5959       °       °[name] (women and children)       °[address] °, [place] [address] °336-275-0820 °[email or web address] for application and process °Application Required ° °[facility]   °[address]    °[place] [address]     °336.886.4922       °             °[facility] °[address] °, [place] [address] °336.273.5572 °336-235-0363(schedule application appt.) °Application Required ° °[facility] (women only)    °[address]     °[place],  [address]     °336-884-1039      °  Intake starts 6pm daily °Need valid ID, SSC, & Police report °[facility] [place] °[address] °[place], [place] °336-881-5420 °Application Required ° °[facility] (men only)     °[address]      °[place], [place]     °[phone]      ° °[facility] °(Pregnant women only) °[address] °[place], [place] °336-275-0206 ° °[facility]      °[address]      °[place], [place] [address]     °336-722-9951      °       °[facility] °[address] °[place], [place] °336-723-1848 °90 day commitment/SA/Application process ° °[facility](men only)     °[address]     °[place], [place]     °336-748-1962       °Check-in at 7pm     °       °[facility] °[address] °[place], [place] [address] °336-248-6684 °Men/Women/Women and Children must be there by 7 pm ° °[facility] °[place],  °336-722-8721                ° °

## 2018-10-12 NOTE — ED Notes (Signed)
   The Pt has orders to be discharged at this time. He is currently un housed with no family present belonging given back to patient. He is currently requesting a ride back to a local park where he sleeps, at this time the buses are not currently running. The pt verbalizes and understands  his circumstance at this time.

## 2018-10-12 NOTE — BH Assessment (Signed)
Tele Assessment Note   Patient Name: Samuel Reyes MRN: [id number] Referring Physician:  Kristine Royal Location of Patient: [facility] Location of Provider: [place] Department  Andriy Sherk is an 63 y.o. male alcoholic with past medical history of coronary artery disease, COPD, depression, GERD, hypertension.  He comes to the emergency department for complaints of vomiting and epigastric pain. Patient was released from [facility] yesterday. When asked what brings you to the hospital patient stated, "I drank to much. I drank at least a 1/2 pack of beer." Patient initially denied being suicidal to TTS assessor then changed is disposition stating he was suicidal. Patient report he has been suicidal for months with a plan to walk in front of traffic.  He has been seen several times in the emergency department in the last month related to alcoholism, suicidal ideation, and homelessness. Previous visits to the ED most recent 10/11/2018 and 10/02/2018 patient was evaluated by psychiatry who set him up with [facility]. Patient reports that he does not remember anything about this and that he has been sleeping in a park. Patient report he's depressed and homeless. Depression triggered by feeling lonely. Patient report his family is dead wife and sons. He reports he wants to kill himself via walking in front of traffic. Patient denied homicidal ideations, denied auditory / visual hallucinations.   Diagnosis: F10.24   Alcohol-induced depressive disorder, With moderate or severe use disorder    Past Medical History:  Past Medical History:  Diagnosis Date  . Alcoholism ([place])   . CAD (coronary artery disease) 2002   MI, no intervention required  . COPD (chronic obstructive pulmonary disease) (HCC)    not on home O2  . Depression   . GERD (gastroesophageal reflux disease)   . Headache   . Hypertension   . Stroke [facility]) 2006    Past Surgical History:  Procedure Laterality Date   . BACK SURGERY     3 cervical spine surgeries C4-C5 fused  . COLONOSCOPY N/A 01/04/2014   Procedure: COLONOSCOPY;  Surgeon: Danie Binder, MD;  Location[facility];  Service: Endoscopy;  Laterality: N/A;  1:45  . ESOPHAGOGASTRODUODENOSCOPY N/A 01/04/2014   Procedure: ESOPHAGOGASTRODUODENOSCOPY (EGD);  Surgeon: Danie Binder, MD;  Location: [facility];  Service: Endoscopy;  Laterality: N/A;  . FINGER SURGERY Left    2nd, 3rd, & 4th fingers were cut off by table saw and reattached  . GASTRECTOMY    . HERNIA REPAIR    . INCISIONAL HERNIA REPAIR N/A 01/20/2014   Procedure: LAPAROSCOPIC RECURRENT  INCISIONAL HERNIA with mesh;  Surgeon: Edward Jolly, MD;  Location: [facility] ORS;  Service: General;  Laterality: N/A;  . rt knee arthroscopic surgery    . SHOULDER SURGERY Bilateral    3 surgeries on on left, 2 surgeries on right     Family History:  Family History  Problem Relation Age of Onset  . Cancer Father        bone  . Cancer Brother        lungs  . Stroke Maternal Grandmother   . Asthma Son        died at age 21 in his sleep   . Spina bifida Son        died at age 60   . Dementia Mother   . Colon cancer Neg Hx     Social History:  reports that he has been smoking cigarettes. He started smoking about 52 years  ago. He has a 104.00 pack-year smoking history. He has never used smokeless tobacco. He reports current alcohol use of about 3.0 - 4.0 standard drinks of alcohol per week. He reports that he does not use drugs.  Additional Social History:  Alcohol / Drug Use Pain Medications: See MAR Prescriptions: See MAR Over the Counter: See MAR History of alcohol / drug use?: Yes Longest period of sobriety (when/how long): Per chart, "7 months."  Negative Consequences of Use: Financial, Personal relationships Withdrawal Symptoms: Tingling, Seizures Onset of Seizures: Pt reported, he had an alcohol related seizure today.  Date of most recent seizure: Today. Pt reported, he has  had a total of 4-5 seizures in lifetime.  Substance #1 Name of Substance 1: Alochol.  1 - Age of First Use: UTA 1 - Amount (size/oz): Pt reported, 2-3 swallows of beer today. Pt reported, drinking a lot yesterday.  1 - Frequency: Daily. 1 - Duration: Ongoing.  1 - Last Use / Amount: 10/12/2018  CIWA: CIWA-Ar BP: (!) 148/75 Pulse Rate: 99 Nausea and Vomiting: no nausea and no vomiting Tactile Disturbances: none Tremor: two Auditory Disturbances: not present Paroxysmal Sweats: no sweat visible Visual Disturbances: not present Anxiety: mildly anxious Headache, Fullness in Head: moderate Agitation: normal activity Orientation and Clouding of Sensorium: oriented and can do serial additions CIWA-Ar Total: 6 COWS:    Allergies:  Allergies  Allergen Reactions  . Bee Venom Anaphylaxis  . Penicillins Rash    Has patient had a PCN reaction causing immediate rash, facial/tongue/throat swelling, SOB or lightheadedness with hypotension: {Yes Has patient had a PCN reaction causing severe rash involving mucus membranes or skin necrosis: YES Has patient had a PCN reaction that required hospitalization Yes Has patient had a PCN reaction occurring within the last 10 years: YES If all of the above answers are "NO", then may proceed with Cephalosporin use.   . Vancomycin Tinitus    Home Medications: (Not in a hospital admission)   OB/GYN Status:  No LMP for male patient.  General Assessment Data Location of Assessment: [facility] TTS Assessment: In system Is this a Tele or Face-to-Face Assessment?: Face-to-Face Is this an Initial Assessment or a Re-assessment for this encounter?: Initial Assessment Patient Accompanied by:: N/A Language Other than English: No Living Arrangements: Homeless/Shelter What gender do you identify as?: Male Marital status: Widowed Bessie name: n/a Living Arrangements: Other (Comment)(homeless) Can pt return to current living arrangement?: Yes Admission Status:  Voluntary Is patient capable of signing voluntary admission?: Yes Referral Source: Self/Family/Friend Insurance type: Medicare     Crisis Care Plan Living Arrangements: Other (Comment)(homeless) Legal Guardian: Other:(self) Name of Psychiatrist: [facility].   Education Status Is patient currently in school?: No Is the patient employed, unemployed or receiving disability?: Receiving disability income  Risk to self with the past 6 months Suicidal Ideation: Yes-Currently Present(client denied then expressed he was suicidal ) Has patient been a risk to self within the past 6 months prior to admission? : Yes Suicidal Intent: No Has patient had any suicidal intent within the past 6 months prior to admission? : Yes Is patient at risk for suicide?: No Suicidal Plan?: Yes-Currently Present Has patient had any suicidal plan within the past 6 months prior to admission? : Yes Specify Current Suicidal Plan: walk in front of a car Access to Means: Yes Specify Access to Suicidal Means: access to traffic  What has been your use of drugs/alcohol within the last 12 months?: alcohol Previous Attempts/Gestures: Yes How many times?:  3 Other Self Harm Risks: drinking alcohol Triggers for Past Attempts: Other (Comment) Intentional Self Injurious Behavior: Damaging(alcohol) Comment - Self Injurious Behavior: alcohol  Family Suicide History: No Recent stressful life event(s): Other (Comment)(depression, drinking ) Persecutory voices/beliefs?: No Depression: Yes Depression Symptoms: Guilt, Loss of interest in usual pleasures, Feeling worthless/self pity, Insomnia, Despondent Substance abuse history and/or treatment for substance abuse?: No Suicide prevention information given to non-admitted patients: Not applicable  Risk to Others within the past 6 months Homicidal Ideation: No Does patient have any lifetime risk of violence toward others beyond the six months prior to admission? : No Thoughts of  Harm to Others: No Current Homicidal Intent: No Current Homicidal Plan: No Access to Homicidal Means: No Identified Victim: n/a History of harm to others?: No Assessment of Violence: None Noted Violent Behavior Description: None Noted Does patient have access to weapons?: No Criminal Charges Pending?: No Does patient have a court date: No Is patient on probation?: No  Psychosis Hallucinations: None noted(has reported in the past, denied today ) Delusions: None noted  Mental Status Report Appearance/Hygiene: Unremarkable Eye Contact: Fair Motor Activity: Unremarkable Speech: Logical/coherent Level of Consciousness: Quiet/awake Mood: Helpless Affect: Appropriate to circumstance Anxiety Level: None Thought Processes: Coherent, Relevant Judgement: Unimpaired Orientation: Person, Place, Time, Situation Obsessive Compulsive Thoughts/Behaviors: None     ADLScreening ([facility]) Patient's cognitive ability adequate to safely complete daily activities?: Yes Patient able to express need for assistance with ADLs?: Yes Independently performs ADLs?: Yes (appropriate for developmental age)  Prior Inpatient Therapy Prior Inpatient Therapy: No  Prior Outpatient Therapy Prior Outpatient Therapy: Yes Prior Therapy Facilty/Provider(s): [facility]  Reason for Treatment: Medication management. Does patient have an ACCT team?: No Does patient have Intensive In-House Services?  : No Does patient have [facility] services? : No Does patient have P4CC services?: No  ADL Screening (condition at time of admission) Patient's cognitive ability adequate to safely complete daily activities?: Yes Is the patient deaf or have difficulty hearing?: No Does the patient have difficulty seeing, even when wearing glasses/contacts?: Yes Does the patient have difficulty concentrating, remembering, or making decisions?: Yes Patient able to express need for assistance with ADLs?: Yes Does the  patient have difficulty dressing or bathing?: No Independently performs ADLs?: Yes (appropriate for developmental age) Does the patient have difficulty walking or climbing stairs?: Yes Weakness of Legs: Both Weakness of Arms/Hands: Both       Abuse/Neglect Assessment (Assessment to be complete while patient is alone) Physical Abuse: Denies Verbal Abuse: Denies Sexual Abuse: Denies Exploitation of patient/patient's resources: Denies Self-Neglect: Denies Values / Beliefs Cultural Requests During Hospitalization: None Spiritual Requests During Hospitalization: None   Advance Directives (For Healthcare) Does Patient Have a Medical Advance Directive?: No Would patient like information on creating a medical advance directive?: No - Patient declined          Disposition:  Disposition Initial Assessment Completed for this Encounter: Yes   Brody Bonneau [facility] 10/12/2018 9:27 AM

## 2018-10-12 NOTE — Progress Notes (Deleted)
Subjective:    Patient ID: Samuel Reyes, male    DOB: 01/16/1956, 63 y.o.   MRN: [id number]  ED 3/14 and 3/15: 11:46 PM Patient with eighth ER visit in 6 months.  He just got discharged from behavioral health on March 11 Patient has had multiple admissions for psychiatric conditions.  He is currently intoxicated. Due to chest wall pain, will obtain chest x-ray. 1:34 AM Patient resting comfortably. Chest x-ray is negative. Patient just got out of behavioral health, I do not feel he requires repeat admission. At this point I feel his risk for self injury is low.  Just recently deemed safe for discharge to behavioral health. Will discharge  [facility] admit : Date of Admission:  10/03/2018 Date of Discharge: 10/08/2018  Reason for Admission:  Suicidal ideation  Principal Problem: MDD (major depressive disorder), recurrent severe, without psychosis ([place]) Discharge Diagnoses: Principal Problem:   MDD (major depressive disorder), recurrent severe, without psychosis ([place]) Active Problems:   Alcohol use   Past Psychiatric History: Per admission H&P: Patient has had multiple psychiatric hospitalizations at this facility. He has been admitted 3 times in the last several months. His last admission directly to our facility was on 07/18/2018. He was readmitted on 09/06/2018 after an intentional overdose. Most of his psychiatric hospitalizations involve alcohol.  Past Medical History:  Past Medical History: Diagnosis Date . Alcoholism ([place])  . CAD (coronary artery disease) 2002  MI, no intervention required . COPD (chronic obstructive pulmonary disease) (HCC)   not on home O2 . Depression  . GERD (gastroesophageal reflux disease)  . Headache  . Hypertension  . Stroke [facility]) 2006   Past Surgical History: Procedure Laterality Date . BACK SURGERY    3 cervical spine surgeries C4-C5 fused . COLONOSCOPY N/A 01/04/2014  Procedure: COLONOSCOPY;  Surgeon: Danie Binder, MD;   [facility];  Service: Endoscopy;  Laterality: N/A;  1:45 . ESOPHAGOGASTRODUODENOSCOPY N/A 01/04/2014  Procedure: ESOPHAGOGASTRODUODENOSCOPY (EGD);  Surgeon: Danie Binder, MD;  Location[facility];  Service: Endoscopy;  Laterality: N/A; . FINGER SURGERY Left   2nd, 3rd, & 4th fingers were cut off by table saw and reattached . GASTRECTOMY   . HERNIA REPAIR   . INCISIONAL HERNIA REPAIR N/A 01/20/2014  Procedure: LAPAROSCOPIC RECURRENT  INCISIONAL HERNIA with mesh;  Surgeon: Edward Jolly, MD;  Location: [facility];  Service: General;  Laterality: N/A; . rt knee arthroscopic surgery   . SHOULDER SURGERY Bilateral   3 surgeries on on left, 2 surgeries on right   Family History:  Family History Problem Relation Age of Onset . Cancer Father       bone . Cancer Brother       lungs . Stroke Maternal Grandmother  . Asthma Son       died at age 89 in his sleep  . Spina bifida Son       died at age 51  . Dementia Mother  . Colon cancer Neg Hx   Family Psychiatric  History: Per admission H&P: Denied Social History:  Social History  Substance and Sexual Activity Alcohol Use Yes . Alcohol/week: 3.0 - 4.0 standard drinks . Types: 3 - 4 Cans of beer per week  Comment: 2 cases of beer    Social History  Substance and Sexual Activity Drug Use No  Comment: denied using any drugs   Social History  Socioeconomic History . Marital status: Widowed   Spouse name: Not on file . Number of children: 3 .  Years of education: Not on file . Highest education level: Not on file Occupational History . Occupation: Disability Social Needs . Financial resource strain: Not on file . Food insecurity:   Worry: Not on file   Inability: Not on file . Transportation needs:   Medical: Yes   Non-medical: Yes Tobacco Use . Smoking status: Current Every Day Smoker   Packs/day: 2.00   Years: 52.00   Pack years: 104.00   Types:  Cigarettes   Start date: 07/30/1966 . Smokeless tobacco: Never Used . Tobacco comment: down to 1/4 ppd Substance and Sexual Activity . Alcohol use: Yes   Alcohol/week: 3.0 - 4.0 standard drinks   Types: 3 - 4 Cans of beer per week   Comment: 2 cases of beer . Drug use: No   Comment: denied using any drugs . Sexual activity: Not Currently Lifestyle . Physical activity:   Days per week: Not on file   Minutes per session: Not on file . Stress: Not on file Relationships . Social connections:   Talks on phone: Not on file   Gets together: Not on file   Attends religious service: Not on file   Active member of club or organization: Not on file   Attends meetings of clubs or organizations: Not on file   Relationship status: Not on file Other Topics Concern . Not on file Social History Narrative . Not on file   Hospital Course:  Per admission H&P 10/04/2018: Patient is a 63 year old male with a past psychiatric history significant for alcohol dependence, depression, chronic pain, COPD who presented to the [facility] emergency department on 10/02/2018 with suicidal ideation. Patient stated that he had stepped in front of a car, and "the guy braked, I have bad luck". The patient has a long history of psychiatric admissions secondary to alcohol and depression. He has apparently attempted to kill himself on 3 occasions. He attempted suicide by overdosing a few weeks ago, and a cut himself 3 years ago. He stated he is homeless, but that his sister lives in [place] and she has weapons in the home. His last admission to our facility was on 07/18/2018. At that time he was discharged to the [facility] residential program. He was being treated with Celexa for depression as well. His medical conditions include coronary artery disease as well as COPD. He admitted to suicidal ideation currently.  This was one of multiple hospitalizations for Mr. Falls  related to depression and alcohol abuse. He presented to the ED for suicidal ideation. UDS was positive for BZDs. CIWA protocol was ordered with Librium CIWA>10 but patient did not show withdrawal symptoms. He was started on Zoloft and prazosin for nightmares. He participated in group therapy on the unit. CSW worked with patient on housing, as this appeared to be his main stressor. He is leaving with an interview scheduled at the [facility] and given shelter resources as well. He is agreeable to looking into ALF or ILF and has an appointment with his PCP on 10/13/18- encouraged to speak to PCP about a PASSR and an FL2. Mr. Duerson remained on the [facility] unit for5days. He stabilized with medication and therapy. He was discharged on the medications listed below. He has shown improvement with improved mood, affect, sleep, appetite, and interaction. He denies any SI/HI/AVH and contracts for safety. [facility] Patient is provided with prescriptions for medications upon discharge. He is discharging with bus pass to  [employer] interview.   Not seen in our clinic since 09/2017     Review of Systems     Objective:   Physical Exam        Assessment & Plan:

## 2018-10-12 NOTE — ED Notes (Signed)
Patient transported to CT 

## 2018-10-13 ENCOUNTER — Inpatient Hospital Stay: Payer: Self-pay | Admitting: Critical Care Medicine

## 2018-10-13 LAB — CBG MONITORING, ED: Glucose-Capillary: 89 mg/dL (ref 70–99)

## 2018-10-25 ENCOUNTER — Emergency Department (HOSPITAL_COMMUNITY): Payer: Medicaid Other

## 2018-10-25 ENCOUNTER — Encounter (HOSPITAL_COMMUNITY): Payer: Self-pay | Admitting: *Deleted

## 2018-10-25 ENCOUNTER — Other Ambulatory Visit: Payer: Self-pay

## 2018-10-25 ENCOUNTER — Emergency Department (HOSPITAL_COMMUNITY)
Admission: EM | Admit: 2018-10-25 | Discharge: 2018-10-26 | Disposition: A | Discharge status: Home / Self Care | Payer: Medicaid Other | Attending: Emergency Medicine | Admitting: Emergency Medicine

## 2018-10-25 DIAGNOSIS — Z590 Homelessness: Secondary | ICD-10-CM | POA: Insufficient documentation

## 2018-10-25 DIAGNOSIS — R45851 Suicidal ideations: Secondary | ICD-10-CM | POA: Insufficient documentation

## 2018-10-25 DIAGNOSIS — Z634 Disappearance and death of family member: Secondary | ICD-10-CM | POA: Insufficient documentation

## 2018-10-25 DIAGNOSIS — Y929 Unspecified place or not applicable: Secondary | ICD-10-CM | POA: Insufficient documentation

## 2018-10-25 DIAGNOSIS — Y999 Unspecified external cause status: Secondary | ICD-10-CM | POA: Insufficient documentation

## 2018-10-25 DIAGNOSIS — S2242XA Multiple fractures of ribs, left side, initial encounter for closed fracture: Secondary | ICD-10-CM | POA: Diagnosis not present

## 2018-10-25 DIAGNOSIS — F1094 Alcohol use, unspecified with alcohol-induced mood disorder: Secondary | ICD-10-CM | POA: Diagnosis present

## 2018-10-25 DIAGNOSIS — X810XXA Intentional self-harm by jumping or lying in front of motor vehicle, initial encounter: Secondary | ICD-10-CM | POA: Insufficient documentation

## 2018-10-25 DIAGNOSIS — I10 Essential (primary) hypertension: Secondary | ICD-10-CM | POA: Insufficient documentation

## 2018-10-25 DIAGNOSIS — Y9389 Activity, other specified: Secondary | ICD-10-CM | POA: Insufficient documentation

## 2018-10-25 DIAGNOSIS — F1014 Alcohol abuse with alcohol-induced mood disorder: Secondary | ICD-10-CM | POA: Diagnosis not present

## 2018-10-25 DIAGNOSIS — F1010 Alcohol abuse, uncomplicated: Secondary | ICD-10-CM | POA: Insufficient documentation

## 2018-10-25 DIAGNOSIS — S3991XA Unspecified injury of abdomen, initial encounter: Secondary | ICD-10-CM | POA: Insufficient documentation

## 2018-10-25 DIAGNOSIS — S299XXA Unspecified injury of thorax, initial encounter: Secondary | ICD-10-CM | POA: Diagnosis present

## 2018-10-25 DIAGNOSIS — F332 Major depressive disorder, recurrent severe without psychotic features: Secondary | ICD-10-CM | POA: Diagnosis not present

## 2018-10-25 DIAGNOSIS — I2510 Atherosclerotic heart disease of native coronary artery without angina pectoris: Secondary | ICD-10-CM | POA: Diagnosis not present

## 2018-10-25 LAB — CBC
HCT: 38.9 % — ABNORMAL LOW (ref 39.0–52.0)
Hemoglobin: 12 g/dL — ABNORMAL LOW (ref 13.0–17.0)
MCH: 27.6 pg (ref 26.0–34.0)
MCHC: 30.8 g/dL (ref 30.0–36.0)
MCV: 89.4 fL (ref 80.0–100.0)
Platelets: 414 10*3/uL — ABNORMAL HIGH (ref 150–400)
RBC: 4.35 MIL/uL (ref 4.22–5.81)
RDW: 16.8 % — ABNORMAL HIGH (ref 11.5–15.5)
WBC: 13.8 10*3/uL — ABNORMAL HIGH (ref 4.0–10.5)
nRBC: 0 % (ref 0.0–0.2)

## 2018-10-25 LAB — COMPREHENSIVE METABOLIC PANEL
ALBUMIN: 3.6 g/dL (ref 3.5–5.0)
ALT: 13 U/L (ref 0–44)
AST: 15 U/L (ref 15–41)
Alkaline Phosphatase: 98 U/L (ref 38–126)
Anion gap: 13 (ref 5–15)
BUN: 10 mg/dL (ref 8–23)
CO2: 20 mmol/L — ABNORMAL LOW (ref 22–32)
Calcium: 8.5 mg/dL — ABNORMAL LOW (ref 8.9–10.3)
Chloride: 97 mmol/L — ABNORMAL LOW (ref 98–111)
Creatinine, Ser: 0.73 mg/dL (ref 0.61–1.24)
GFR calc Af Amer: >60 mL/min (ref >60)
GFR calc non Af Amer: >60 mL/min (ref >60)
GLUCOSE: 86 mg/dL (ref 70–99)
Potassium: 3.4 mmol/L — ABNORMAL LOW (ref 3.5–5.1)
Sodium: 130 mmol/L — ABNORMAL LOW (ref 135–145)
TOTAL PROTEIN: 7.1 g/dL (ref 6.5–8.1)
Total Bilirubin: 0.6 mg/dL (ref 0.3–1.2)

## 2018-10-25 LAB — RAPID URINE DRUG SCREEN, HOSP PERFORMED
Amphetamines: NOT DETECTED
Barbiturates: NOT DETECTED
Benzodiazepines: NOT DETECTED
Cocaine: NOT DETECTED
Opiates: NOT DETECTED
Tetrahydrocannabinol: NOT DETECTED

## 2018-10-25 LAB — ACETAMINOPHEN LEVEL: Acetaminophen (Tylenol), Serum: <10 ug/mL — ABNORMAL LOW (ref 10–30)

## 2018-10-25 LAB — ETHANOL: Alcohol, Ethyl (B): 198 mg/dL — ABNORMAL HIGH (ref <10)

## 2018-10-25 LAB — SALICYLATE LEVEL: Salicylate Lvl: <7 mg/dL (ref 2.8–30.0)

## 2018-10-25 MED ORDER — IBUPROFEN 200 MG PO TABS
600.0000 mg | ORAL_TABLET | Freq: Three times a day (TID) | ORAL | Status: DC | PRN
  Administered 2018-10-25 – 2018-10-26 (×2): 600 mg via ORAL
  Filled 2018-10-25 (×2): qty 3

## 2018-10-25 MED ORDER — ALBUTEROL SULFATE HFA 108 (90 BASE) MCG/ACT IN AERS: 2.0000 | INHALATION_SPRAY | RESPIRATORY_TRACT | Status: DC | PRN

## 2018-10-25 MED ORDER — FOLIC ACID 1 MG PO TABS
1.0000 mg | ORAL_TABLET | Freq: Every day | ORAL | Status: DC
  Administered 2018-10-26: 1 mg via ORAL
  Filled 2018-10-25: qty 1

## 2018-10-25 MED ORDER — THIAMINE HCL 100 MG/ML IJ SOLN: 100.0000 mg | Freq: Every day | INTRAMUSCULAR | Status: DC

## 2018-10-25 MED ORDER — LORAZEPAM 1 MG PO TABS: 0.0000 mg | ORAL_TABLET | Freq: Two times a day (BID) | ORAL | Status: DC

## 2018-10-25 MED ORDER — PANTOPRAZOLE SODIUM 40 MG PO TBEC
40.0000 mg | DELAYED_RELEASE_TABLET | Freq: Every day | ORAL | Status: DC
  Administered 2018-10-26: 40 mg via ORAL
  Filled 2018-10-25: qty 1

## 2018-10-25 MED ORDER — IOHEXOL 300 MG/ML  SOLN
100.0000 mL | Freq: Once | INTRAMUSCULAR | Status: AC | PRN
  Administered 2018-10-25: 100 mL via INTRAVENOUS

## 2018-10-25 MED ORDER — NICOTINE 21 MG/24HR TD PT24: 21.0000 mg | MEDICATED_PATCH | Freq: Every day | TRANSDERMAL | Status: DC

## 2018-10-25 MED ORDER — LORAZEPAM 2 MG/ML IJ SOLN: 0.0000 mg | Freq: Four times a day (QID) | INTRAMUSCULAR | Status: DC

## 2018-10-25 MED ORDER — SODIUM CHLORIDE (PF) 0.9 % IJ SOLN
INTRAMUSCULAR | Status: AC
  Filled 2018-10-25: qty 50

## 2018-10-25 MED ORDER — PRAZOSIN HCL 1 MG PO CAPS
1.0000 mg | ORAL_CAPSULE | Freq: Every day | ORAL | Status: DC
  Administered 2018-10-25: 1 mg via ORAL
  Filled 2018-10-25: qty 1

## 2018-10-25 MED ORDER — VITAMIN B-1 100 MG PO TABS
100.0000 mg | ORAL_TABLET | Freq: Every day | ORAL | Status: DC
  Administered 2018-10-26: 100 mg via ORAL
  Filled 2018-10-25: qty 1

## 2018-10-25 MED ORDER — LORAZEPAM 2 MG/ML IJ SOLN: 0.0000 mg | Freq: Two times a day (BID) | INTRAMUSCULAR | Status: DC

## 2018-10-25 MED ORDER — SODIUM CHLORIDE 0.9 % IV SOLN: INTRAVENOUS | Status: DC

## 2018-10-25 MED ORDER — LORAZEPAM 1 MG PO TABS
0.0000 mg | ORAL_TABLET | Freq: Four times a day (QID) | ORAL | Status: DC
  Administered 2018-10-25 – 2018-10-26 (×2): 1 mg via ORAL
  Filled 2018-10-25 (×2): qty 1

## 2018-10-25 NOTE — ED Notes (Signed)
TTS consult in progress. °
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
## 2018-10-25 NOTE — Progress Notes (Signed)
Disposition: Lindon Romp, NP recommends continued observation for safety and stabilization due to hx of suicide attempts, alcohol abuse, and possible secondary gain due to presenting to the ED multiple times in the past 6 months c/o alcohol abuse. Pt recently d/c from [facility] on 10[facility]. EDP Lacretia Leigh, MD and Cristie Hem, RN have been advised.   Lind Covert, MSW, LCSW Therapeutic Triage Specialist  [phone]

## 2018-10-25 NOTE — ED Notes (Signed)
Pt's belongings moved to the cabinet labeled, "patient belongings 46-18."

## 2018-10-25 NOTE — ED Notes (Signed)
Pt c/o everything being blurry. When asked if he wears corrective lenses, he said that he did but they broke about 3 months ago.

## 2018-10-25 NOTE — ED Triage Notes (Signed)
Per EMS, pt was found sleeping outside of [employer], who reported he was suicidal upon being awoken. Pt states he has been suicidal for months, states he would walk into traffic. Pt also complains of of chest wall pain for several months.Pt A&Ox3, vitals stable.

## 2018-10-25 NOTE — ED Notes (Signed)
Pt's belongings placed in one white bag, and placed under the ice machine in triage.

## 2018-10-25 NOTE — ED Notes (Signed)
Patient transported to X-ray 

## 2018-10-25 NOTE — ED Notes (Signed)
Attempt x 2 with U/S IV unsuccessful-placed peripheral IV right anterior wrist

## 2018-10-25 NOTE — ED Notes (Signed)
Pt states that he got a hotel room a few nights ago and slipped while getting out of the shower. This is how he fractured his ribs

## 2018-10-25 NOTE — ED Notes (Signed)
Bed: [facility] Expected date:  Expected time:  Means of arrival:  Comments: 
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
## 2018-10-26 DIAGNOSIS — F1014 Alcohol abuse with alcohol-induced mood disorder: Secondary | ICD-10-CM

## 2018-10-26 DIAGNOSIS — Z590 Homelessness: Secondary | ICD-10-CM

## 2018-10-26 MED ORDER — GABAPENTIN 300 MG PO CAPS
300.0000 mg | ORAL_CAPSULE | Freq: Two times a day (BID) | ORAL | Status: DC
  Filled 2018-10-26: qty 1

## 2018-10-26 NOTE — Patient Outreach (Signed)
CPSS received an update regarding the status of detox referrals to [name] and [facility]. Patient was denied at [facility] due to history of withdrawal seizures. [facility] currently do not have any detox beds available. CPSS will provide follow up information for substance use recovery resources including detox center list, residential/outpatient center list, AA meeting list, and CPSS contact information. CPSS will strongly encourage the patient to continue to stay in contact with CPSS for further substance use recovery support and help with substance use treatment resources after discharge from the [facility].

## 2018-10-26 NOTE — Patient Outreach (Signed)
ED Peer Support Specialist Patient Intake (Complete at intake & 30-60 Day Follow-up)  Name: Samuel Reyes  MRN: [id number]  Age: 63 y.o.   Date of Admission: 10/26/2018  Intake: Initial Comments:      Primary Reason Admitted: alcohol use, alcohol withdrawals, SI  Lab values: Alcohol/ETOH: Positive Positive UDS? 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 Yes  Number of ED visits: 9  Reason(s) for visit: alcohol use, intentional drug overdose, SI   In past year, has patient been hospitalized for any reason? Yes  Number of hospitalizations: 4  Reason(s) for hospitalization: SI, alcohol use at [facility] on 07/18/18 and 10/03/18, [facility] on 05/25/18, Medical admissionon 09/07/18 for SI attempt for drug overdose  In past year, has patient been arrested? No  Number of arrests:    Reason(s) for arrest:    In past year, has patient been incarcerated? No  Number of incarcerations:    Reason(s) for incarceration:    In past year, has patient received medication-assisted treatment? No  In past year, patient received the following treatments:    In past year, has patient received any harm reduction services? No  Did this include any of the following?    In past year, has patient received care from a mental health provider for diagnosis other than SUD? No  In past year, is this first time patient has overdosed? (did not come in for drug overdose)  Number of past overdoses:    In  past year, is this first time patient has been hospitalized for an overdose? (was hospitalized on 09/07/18 for SI attempt with intentional drug overdose )  Number of hospitalizations for overdose(s):    Is patient currently receiving treatment for a mental health diagnosis? No  Patient reports experiencing difficulty participating in SUD treatment: Yes    Most important reason(s) for this difficulty? Lack of stable housing, Cost or financial reasons, Physical health problems  Has patient received prior services for treatment? Yes  In past, patient has received services from following agencies: Other (comment)([facility] two times in the past year, [facility] one time in the past year)  Plan of Care:  Suggested follow up at these agencies/treatment centers: (Patient wants a detox referral for his alcohol use. CPSS will keep the patient update regarding the status of the referral. )  Other information: CPSS met with the patient to provide substance use recovery support and help with substance use treatment resources. CPSS is working on referral to [facility] and [facility] for detox/dual diagnosis treatment. CPSS will also provide information for several substance use recovery resources including CPSS contact information.   Mason Jim, CPSS  10/26/2018 12:15 PM

## 2018-10-26 NOTE — ED Notes (Signed)
Bed: W[facility]15 Expected date:  Expected time:  Means of arrival:  Comments:

## 2018-10-26 NOTE — Progress Notes (Signed)
Received Samuel Reyes from the main  ED, alert and immediately went to bed. He slept throughout and cooperative with VS check and CIWA scoring. His CIWA was 7 and medicated per order along with Motrin for rib pain.

## 2018-10-26 NOTE — Consult Note (Addendum)
[facility] Psych ED Discharge  10/26/2018 12:48 PM Samuel Reyes  MRN:  [id number] Principal Problem: Alcohol abuse with alcohol-induced mood disorder [facility]) Discharge Diagnoses: Principal Problem:   Alcohol abuse with alcohol-induced mood disorder ([place])   Subjective: Pt was seen and chart reviewed with treatment team and Dr Darleene Cleaver. Pt denies suicidal/homicidal ideation, denies auditory/visual hallucinations and does not appear to be responding to internal stimuli. Pt is well known to this emergency room system. He has 9 ED and 3 inpatient admissions  visits in 6 months. Pt's UDS negative, BAL 198. Pt frequently fails to follow up with outpatient resources given to him upon discharge. Pt was last discharged form [facility] on 3/11 and was to follow up at [name]. He did not follow up and continues to abuse alcohol. Pt is homeless.  Pt will be seen by Peer Support for substance abuse resources. He will be given outpatient and shelter resources. Pt will be informed that during the Coronavirus Pandemic the [facility] has opened the [facility] to persons who are homeless. This information was placed in the Pt's discharge AVS. Pt is psychiatrically clear.   Total Time spent with patient: 30 minutes  Past Psychiatric History: As above  Past Medical History:  Past Medical History:  Diagnosis Date  . Alcoholism ([place])   . CAD (coronary artery disease) 2002   MI, no intervention required  . COPD (chronic obstructive pulmonary disease) (HCC)    not on home O2  . Depression   . GERD (gastroesophageal reflux disease)   . Headache   . Hypertension   . Stroke [facility]) 2006    Past Surgical History:  Procedure Laterality Date  . BACK SURGERY     3 cervical spine surgeries C4-C5 fused  . COLONOSCOPY N/A 01/04/2014   Procedure: COLONOSCOPY;  Surgeon: Danie Binder, MD;  Location: [facility] ENDO SUITE;  Service: Endoscopy;  Laterality: N/A;  1:45  . ESOPHAGOGASTRODUODENOSCOPY  N/A 01/04/2014   Procedure: ESOPHAGOGASTRODUODENOSCOPY (EGD);  Surgeon: Danie Binder, MD;  Location: [facility] ENDO SUITE;  Service: Endoscopy;  Laterality: N/A;  . FINGER SURGERY Left    2nd, 3rd, & 4th fingers were cut off by table saw and reattached  . GASTRECTOMY    . HERNIA REPAIR    . INCISIONAL HERNIA REPAIR N/A 01/20/2014   Procedure: LAPAROSCOPIC RECURRENT  INCISIONAL HERNIA with mesh;  Surgeon: Edward Jolly, MD;  Location: [facility] ORS;  Service: General;  Laterality: N/A;  . rt knee arthroscopic surgery    . SHOULDER SURGERY Bilateral    3 surgeries on on left, 2 surgeries on right    Family History:  Family History  Problem Relation Age of Onset  . Cancer Father        bone  . Cancer Brother        lungs  . Stroke Maternal Grandmother   . Asthma Son        died at age 61 in his sleep   . Spina bifida Son        died at age 35   . Dementia Mother   . Colon cancer Neg Hx    Family Psychiatric  History: Pt did not give this information Social History:  Social History   Substance and Sexual Activity  Alcohol Use Yes  . Alcohol/week: 3.0 - 4.0 standard drinks  . Types: 3 - 4 Cans of beer per week   Comment: 2 cases of beer  Social History   Substance and Sexual Activity  Drug Use No   Comment: denied using any drugs    Social History   Socioeconomic History  . Marital status: Widowed    Spouse name: Not on file  . Number of children: 3  . Years of education: Not on file  . Highest education level: Not on file  Occupational History  . Occupation: Disability  Social Needs  . Financial resource strain: Not on file  . Food insecurity:    Worry: Not on file    Inability: Not on file  . Transportation needs:    Medical: Yes    Non-medical: Yes  Tobacco Use  . Smoking status: Current Every Day Smoker    Packs/day: 2.00    Years: 52.00    Pack years: 104.00    Types: Cigarettes    Start date: 07/30/1966  . Smokeless tobacco: Never Used  . Tobacco comment:  down to 1/4 ppd  Substance and Sexual Activity  . Alcohol use: Yes    Alcohol/week: 3.0 - 4.0 standard drinks    Types: 3 - 4 Cans of beer per week    Comment: 2 cases of beer  . Drug use: No    Comment: denied using any drugs  . Sexual activity: Not Currently  Lifestyle  . Physical activity:    Days per week: Not on file    Minutes per session: Not on file  . Stress: Not on file  Relationships  . Social connections:    Talks on phone: Not on file    Gets together: Not on file    Attends religious service: Not on file    Active member of club or organization: Not on file    Attends meetings of clubs or organizations: Not on file    Relationship status: Not on file  Other Topics Concern  . Not on file  Social History Narrative  . Not on file    Has this patient used any form of tobacco in the last 30 days? (Cigarettes, Smokeless Tobacco, Cigars, and/or Pipes) Prescription not provided because: Pt denies he uses tobacco products  Current Medications: Current Facility-Administered Medications  Medication Dose Route Frequency Provider Last Rate Last Dose  . 0.9 %  sodium chloride infusion   Intravenous Continuous Lacretia Leigh, MD      . albuterol (PROVENTIL HFA;VENTOLIN HFA) 108 (90 Base) MCG/ACT inhaler 2 puff  2 puff Inhalation Q4H PRN Lacretia Leigh, MD      . folic acid (FOLVITE) tablet 1 mg  1 mg Oral Daily Lacretia Leigh, MD   1 mg at 10/26/18 1101  . gabapentin (NEURONTIN) capsule 300 mg  300 mg Oral BID Suzzanne Brunkhorst, MD      . ibuprofen (ADVIL,MOTRIN) tablet 600 mg  600 mg Oral Q8H PRN Lacretia Leigh, MD   600 mg at 10/26/18 6759  . nicotine (NICODERM CQ - dosed in mg/24 hours) patch 21 mg  21 mg Transdermal Daily Lacretia Leigh, MD      . pantoprazole (PROTONIX) EC tablet 40 mg  40 mg Oral Daily Lacretia Leigh, MD   40 mg at 10/26/18 1101  . prazosin (MINIPRESS) capsule 1 mg  1 mg Oral QHS Lacretia Leigh, MD   1 mg at 10/25/18 2354  . thiamine (VITAMIN B-1) tablet  100 mg  100 mg Oral Daily Lacretia Leigh, MD   100 mg at 10/26/18 1101   Or  . thiamine (B-1) injection 100 mg  100  mg Intravenous Daily Lacretia Leigh, MD       Current Outpatient Medications  Medication Sig Dispense Refill  . acetaminophen (TYLENOL) 325 MG tablet Take 2 tablets (650 mg total) by mouth every 6 (six) hours as needed for mild pain.    Marland Kitchen albuterol (PROVENTIL HFA;VENTOLIN HFA) 108 (90 Base) MCG/ACT inhaler Inhale 2 puffs into the lungs every 4 (four) hours as needed for wheezing or shortness of breath.    . folic acid (FOLVITE) 1 MG tablet Take 1 mg by mouth daily.    . pantoprazole (PROTONIX) 40 MG tablet Take 1 tablet (40 mg total) by mouth daily. For reflux 30 tablet 0  . prazosin (MINIPRESS) 1 MG capsule Take 1 capsule (1 mg total) by mouth at bedtime. For nightmares 30 capsule 0  . sertraline (ZOLOFT) 50 MG tablet Take 1 tablet (50 mg total) by mouth daily. For mood 30 tablet 0  . tiotropium (SPIRIVA) 18 MCG inhalation capsule Place 1 capsule (18 mcg total) into inhaler and inhale daily. For COPD 30 capsule 12  . traZODone (DESYREL) 100 MG tablet Take 1 tablet (100 mg total) by mouth at bedtime and may repeat dose one time if needed. For sleep 30 tablet 0  . Multiple Vitamin (MULTIVITAMIN WITH MINERALS) TABS tablet Take 1 tablet by mouth daily. Vitamin supplementation (Patient not taking: Reported on 09/06/2018)    . nicotine (NICODERM CQ - DOSED IN MG/24 HOURS) 21 mg/24hr patch Place 1 patch (21 mg total) onto the skin daily. For smoking cessation (Patient not taking: Reported on 10/26/2018) 28 patch 0  . thiamine 100 MG tablet Take 1 tablet (100 mg total) by mouth daily. (Patient not taking: Reported on 10/03/2018) 30 tablet 3   PTA Medications: (Not in a hospital admission)   Musculoskeletal: Strength & Muscle Tone: within normal limits Gait & Station: normal Patient leans: N/A  Psychiatric Specialty Exam: Physical Exam  Constitutional: He is oriented to person, place,  and time. He appears well-developed and well-nourished.  Respiratory: Effort normal.  Neurological: He is alert and oriented to person, place, and time.  Psychiatric: His mood appears anxious.    Review of Systems  Psychiatric/Behavioral: Positive for substance abuse.    Blood pressure 127/73, pulse (!) 108, temperature 97.9 F (36.6 C), temperature source Oral, resp. rate 20, height 5\' 5"  (1.651 m), weight 68.5 kg, SpO2 95 %.Body mass index is 25.13 kg/m.  General Appearance: Casual  Eye Contact:  Fair  Speech:  Clear and Coherent and Normal Rate  Volume:  Normal  Mood:  Anxious and Irritable  Affect:  Congruent  Thought Process:  Coherent, Goal Directed and Descriptions of Associations: Intact  Orientation:  Full (Time, Place, and Person)  Thought Content:  Logical  Suicidal Thoughts:  No  Homicidal Thoughts:  No  Memory:  Immediate;   Good Recent;   Fair Remote;   Fair  Judgement:  Fair  Insight:  Present  Psychomotor Activity:  Normal  Concentration:  Concentration: Good and Attention Span: Good  Recall:  Good  Fund of Knowledge:  Good  Language:  Good  Akathisia:  No  Handed:  Right  AIMS (if indicated):     Assets:  Communication Skills Social Support  ADL's:  Intact  Cognition:  WNL  Sleep:        Demographic Factors:  Male, Caucasian, Low socioeconomic status and Unemployed  Loss Factors: Financial problems/change in socioeconomic status  Historical Factors: Family history of mental illness or substance abuse  Risk Reduction Factors:   Sense of responsibility to family  Continued Clinical Symptoms:  Alcohol/Substance Abuse/Dependencies  Cognitive Features That Contribute To Risk:  Closed-mindedness    Suicide Risk:  Minimal: No identifiable suicidal ideation.  Patients presenting with no risk factors but with morbid ruminations; may be classified as minimal risk based on the severity of the depressive symptoms    Plan Of Care/Follow-up  recommendations:  Activity:  as tolerated Diet:  Heart healthy  Disposition and treatment Plan:  Alcohol abuse with alcohol-induced mood disorder ([place]) Take all medications as prescribed. Keep all follow-up appointments as scheduled.  Do not consume alcohol or use illegal drugs while on prescription medications. Report any adverse effects from your medications to your primary care provider promptly.  In the event of recurrent symptoms or worsening symptoms, call 911, a crisis hotline, or go to the nearest emergency department for evaluation.   Ethelene Hal, NP 10/26/2018, 12:48 PM  Patient seen face-to-face for psychiatric evaluation, chart reviewed and case discussed with the physician extender and developed treatment plan. Reviewed the information documented and agree with the treatment plan. Corena Pilgrim, MD

## 2018-10-26 NOTE — Discharge Instructions (Signed)
For your medical needs, please follow up at:  [facility] [place], [place] [address]  [phone]  For your mental health needs, please follow up at:  [facility] [address], [place] [address] [phone]  For your shelter needs, please follow up at:   [facility] [place], [place] [address] [phone]  Pease be advised that during the [employer] epidemic there is a 24 hour shelter at   [facility] [address]  [place], [place] [address]  [phone]

## 2018-10-26 NOTE — ED Notes (Signed)
Security wanded and escorted patient to [facility]

## 2018-10-26 NOTE — ED Notes (Signed)
Pt discharged safely with resources.  Discharge instructions were reviewed .  All belongings were returned.

## 2018-11-18 ENCOUNTER — Encounter (HOSPITAL_COMMUNITY): Payer: Self-pay | Admitting: Emergency Medicine

## 2018-11-18 ENCOUNTER — Emergency Department (HOSPITAL_COMMUNITY): Payer: Medicaid Other

## 2018-11-18 ENCOUNTER — Other Ambulatory Visit: Payer: Self-pay

## 2018-11-18 ENCOUNTER — Encounter: Payer: Self-pay | Admitting: *Deleted

## 2018-11-18 ENCOUNTER — Emergency Department (HOSPITAL_COMMUNITY)
Admission: EM | Admit: 2018-11-18 | Discharge: 2018-11-19 | Disposition: A | Discharge status: Home / Self Care | Payer: Medicaid Other | Attending: Emergency Medicine | Admitting: Emergency Medicine

## 2018-11-18 ENCOUNTER — Ambulatory Visit (HOSPITAL_COMMUNITY)
Admission: RE | Admit: 2018-11-18 | Discharge: 2018-11-18 | Disposition: A | Discharge status: Home / Self Care | Payer: Medicaid Other | Attending: Psychiatry | Admitting: Psychiatry

## 2018-11-18 DIAGNOSIS — I2510 Atherosclerotic heart disease of native coronary artery without angina pectoris: Secondary | ICD-10-CM | POA: Insufficient documentation

## 2018-11-18 DIAGNOSIS — I252 Old myocardial infarction: Secondary | ICD-10-CM | POA: Insufficient documentation

## 2018-11-18 DIAGNOSIS — Z5900 Homelessness unspecified: Secondary | ICD-10-CM

## 2018-11-18 DIAGNOSIS — R6883 Chills (without fever): Secondary | ICD-10-CM | POA: Insufficient documentation

## 2018-11-18 DIAGNOSIS — Z7289 Other problems related to lifestyle: Secondary | ICD-10-CM | POA: Diagnosis not present

## 2018-11-18 DIAGNOSIS — J449 Chronic obstructive pulmonary disease, unspecified: Secondary | ICD-10-CM | POA: Insufficient documentation

## 2018-11-18 DIAGNOSIS — R45851 Suicidal ideations: Secondary | ICD-10-CM | POA: Insufficient documentation

## 2018-11-18 DIAGNOSIS — F332 Major depressive disorder, recurrent severe without psychotic features: Secondary | ICD-10-CM | POA: Diagnosis present

## 2018-11-18 DIAGNOSIS — I10 Essential (primary) hypertension: Secondary | ICD-10-CM | POA: Diagnosis not present

## 2018-11-18 DIAGNOSIS — F419 Anxiety disorder, unspecified: Secondary | ICD-10-CM | POA: Diagnosis not present

## 2018-11-18 DIAGNOSIS — F1020 Alcohol dependence, uncomplicated: Secondary | ICD-10-CM | POA: Diagnosis present

## 2018-11-18 DIAGNOSIS — F17210 Nicotine dependence, cigarettes, uncomplicated: Secondary | ICD-10-CM | POA: Insufficient documentation

## 2018-11-18 DIAGNOSIS — R079 Chest pain, unspecified: Secondary | ICD-10-CM | POA: Insufficient documentation

## 2018-11-18 DIAGNOSIS — F329 Major depressive disorder, single episode, unspecified: Secondary | ICD-10-CM | POA: Diagnosis present

## 2018-11-18 DIAGNOSIS — M791 Myalgia, unspecified site: Secondary | ICD-10-CM | POA: Diagnosis not present

## 2018-11-18 DIAGNOSIS — R05 Cough: Secondary | ICD-10-CM | POA: Insufficient documentation

## 2018-11-18 DIAGNOSIS — F1094 Alcohol use, unspecified with alcohol-induced mood disorder: Secondary | ICD-10-CM

## 2018-11-18 DIAGNOSIS — Z79899 Other long term (current) drug therapy: Secondary | ICD-10-CM | POA: Insufficient documentation

## 2018-11-18 LAB — ACETAMINOPHEN LEVEL: Acetaminophen (Tylenol), Serum: <10 ug/mL — ABNORMAL LOW (ref 10–30)

## 2018-11-18 LAB — CBC
HCT: 43 % (ref 39.0–52.0)
Hemoglobin: 13.5 g/dL (ref 13.0–17.0)
MCH: 26.9 pg (ref 26.0–34.0)
MCHC: 31.4 g/dL (ref 30.0–36.0)
MCV: 85.7 fL (ref 80.0–100.0)
Platelets: 322 10*3/uL (ref 150–400)
RBC: 5.02 MIL/uL (ref 4.22–5.81)
RDW: 16.5 % — ABNORMAL HIGH (ref 11.5–15.5)
WBC: 6.1 10*3/uL (ref 4.0–10.5)
nRBC: 0 % (ref 0.0–0.2)

## 2018-11-18 LAB — COMPREHENSIVE METABOLIC PANEL
ALT: 15 U/L (ref 0–44)
AST: 22 U/L (ref 15–41)
Albumin: 3.7 g/dL (ref 3.5–5.0)
Alkaline Phosphatase: 134 U/L — ABNORMAL HIGH (ref 38–126)
Anion gap: 11 (ref 5–15)
BUN: 9 mg/dL (ref 8–23)
CO2: 25 mmol/L (ref 22–32)
Calcium: 9.1 mg/dL (ref 8.9–10.3)
Chloride: 103 mmol/L (ref 98–111)
Creatinine, Ser: 0.89 mg/dL (ref 0.61–1.24)
GFR calc Af Amer: >60 mL/min (ref >60)
GFR calc non Af Amer: >60 mL/min (ref >60)
Glucose, Bld: 92 mg/dL (ref 70–99)
Potassium: 4.4 mmol/L (ref 3.5–5.1)
Sodium: 139 mmol/L (ref 135–145)
Total Bilirubin: 0.3 mg/dL (ref 0.3–1.2)
Total Protein: 7.1 g/dL (ref 6.5–8.1)

## 2018-11-18 LAB — RAPID URINE DRUG SCREEN, HOSP PERFORMED
Amphetamines: NOT DETECTED
Barbiturates: NOT DETECTED
Benzodiazepines: NOT DETECTED
Cocaine: NOT DETECTED
Opiates: NOT DETECTED
Tetrahydrocannabinol: NOT DETECTED

## 2018-11-18 LAB — SALICYLATE LEVEL: Salicylate Lvl: <7 mg/dL (ref 2.8–30.0)

## 2018-11-18 LAB — TROPONIN I: Troponin I: <0.03 ng/mL (ref <0.03)

## 2018-11-18 LAB — ETHANOL: Alcohol, Ethyl (B): 42 mg/dL — ABNORMAL HIGH (ref <10)

## 2018-11-18 MED ORDER — LORAZEPAM 1 MG PO TABS
0.0000 mg | ORAL_TABLET | Freq: Four times a day (QID) | ORAL | Status: DC
  Administered 2018-11-18 – 2018-11-19 (×2): 1 mg via ORAL
  Filled 2018-11-18 (×2): qty 1

## 2018-11-18 MED ORDER — PRAZOSIN HCL 1 MG PO CAPS
1.0000 mg | ORAL_CAPSULE | Freq: Every day | ORAL | Status: DC
  Administered 2018-11-18: 22:00:00 1 mg via ORAL
  Filled 2018-11-18 (×2): qty 1

## 2018-11-18 MED ORDER — VITAMIN B-1 100 MG PO TABS
100.0000 mg | ORAL_TABLET | Freq: Every day | ORAL | Status: DC
  Administered 2018-11-18 – 2018-11-19 (×2): 100 mg via ORAL
  Filled 2018-11-18 (×2): qty 1

## 2018-11-18 MED ORDER — NICOTINE 21 MG/24HR TD PT24
21.0000 mg | MEDICATED_PATCH | Freq: Every day | TRANSDERMAL | Status: DC
  Administered 2018-11-19: 10:00:00 21 mg via TRANSDERMAL
  Filled 2018-11-18: qty 1

## 2018-11-18 MED ORDER — ALBUTEROL SULFATE HFA 108 (90 BASE) MCG/ACT IN AERS: 2.0000 | INHALATION_SPRAY | RESPIRATORY_TRACT | Status: DC | PRN

## 2018-11-18 MED ORDER — SERTRALINE HCL 50 MG PO TABS
50.0000 mg | ORAL_TABLET | Freq: Every day | ORAL | Status: DC
  Administered 2018-11-18 – 2018-11-19 (×2): 50 mg via ORAL
  Filled 2018-11-18 (×2): qty 1

## 2018-11-18 MED ORDER — ACETAMINOPHEN 325 MG PO TABS
650.0000 mg | ORAL_TABLET | Freq: Four times a day (QID) | ORAL | Status: DC | PRN
  Administered 2018-11-19: 10:00:00 650 mg via ORAL
  Filled 2018-11-18: qty 2

## 2018-11-18 MED ORDER — TRAZODONE HCL 100 MG PO TABS
100.0000 mg | ORAL_TABLET | Freq: Every evening | ORAL | Status: DC | PRN
  Administered 2018-11-18: 100 mg via ORAL
  Filled 2018-11-18: qty 1

## 2018-11-18 MED ORDER — FOLIC ACID 1 MG PO TABS
1.0000 mg | ORAL_TABLET | Freq: Every day | ORAL | Status: DC
  Administered 2018-11-18 – 2018-11-19 (×2): 1 mg via ORAL
  Filled 2018-11-18 (×2): qty 1

## 2018-11-18 MED ORDER — LORAZEPAM 2 MG/ML IJ SOLN: 0.0000 mg | Freq: Two times a day (BID) | INTRAMUSCULAR | Status: DC

## 2018-11-18 MED ORDER — LORAZEPAM 1 MG PO TABS: 0.0000 mg | ORAL_TABLET | Freq: Two times a day (BID) | ORAL | Status: DC

## 2018-11-18 MED ORDER — SODIUM CHLORIDE 0.9% FLUSH: 3.0000 mL | Freq: Once | INTRAVENOUS | Status: DC

## 2018-11-18 MED ORDER — THIAMINE HCL 100 MG/ML IJ SOLN: 100.0000 mg | Freq: Every day | INTRAMUSCULAR | Status: DC

## 2018-11-18 MED ORDER — LORAZEPAM 2 MG/ML IJ SOLN: 0.0000 mg | Freq: Four times a day (QID) | INTRAMUSCULAR | Status: DC

## 2018-11-18 NOTE — Congregational Nurse Program (Signed)
Samuel Reyes Patient reports that he is suicidal with a plan to walk into traffic. Stated "That is a busy road over there. I'm tired of living, I don't want to be here anymore!"  Patient has lost 2 sons and his mother. Patient would contract for safety so decision was made to call [facility].  [place] [employer] was called to assist with transporting the patient to hospital for evaluation.

## 2018-11-18 NOTE — Progress Notes (Signed)
Received Samuel Reyes at the change of shift in his room awake in bed. He verbalized being tired of living related to his losses and have the desire to walk out in front of traffic. This feeling of suicide has been ongoing for months. His CIWA score was 5 and he was medicated per order. The sitter remains at the bedside. He slept throughout  until he was awaken for assessment the morning.Repeat CIWA was Zero.

## 2018-11-18 NOTE — H&P (Signed)
Behavioral Health Medical Screening Exam  Samuel Reyes is an 63 y.o. male patient presents to [facility] as walk in at [place] with complaints of suicidal ideation and plan to walk into traffic.  Patient also reports history of DT's with alcohol withdrawal.  Patient is also complaining of chills, cough, congestion, and chest pain.  Chronic back pain. Patient unable to contract for safety Total Time spent with patient: 30 minutes  Psychiatric Specialty Exam: Physical Exam  Vitals reviewed. Constitutional: He is oriented to person, place, and time. He appears well-nourished.  Neck: Normal range of motion. Neck supple.  Respiratory: Effort normal.  Musculoskeletal: Normal range of motion.  Neurological: He is alert and oriented to person, place, and time.  Skin: Skin is warm and dry.    Review of Systems  Constitutional: Positive for chills.  HENT: Positive for congestion.   Respiratory: Positive for cough.   Cardiovascular: Positive for chest pain.  Musculoskeletal: Positive for myalgias.  Psychiatric/Behavioral: Positive for depression, substance abuse ([facility].  States that he drinks everyday; last was yesterday reports drank 5 40 oz beers) and suicidal ideas. Hallucinations: Denies.       Reports a history of DTs when not trying to stop drinking alcohol with    Blood pressure 120/84, pulse (!) 126, temperature 98.2 F (36.8 C), temperature source Oral, resp. rate 18, SpO2 100 %.There is no height or weight on file to calculate BMI.  General Appearance: Casual  Eye Contact:  Good  Speech:  Clear and Coherent and Normal Rate  Volume:  Normal  Mood:  Anxious and Depressed  Affect:  Depressed  Thought Process:  Coherent and Goal Directed  Orientation:  Full (Time, Place, and Person)  Thought Content:  Denies hallucinations, delusions, and paranoia at this time  Suicidal Thoughts:  Yes.  with intent/plan  Homicidal Thoughts:  No  Memory:  Immediate;   Good Recent;   Good Remote;    Good  Judgement:  Impaired  Insight:  Lacking  Psychomotor Activity:  Normal  Concentration: Concentration: Fair and Attention Span: Fair  Recall:  Good  Fund of Knowledge:Good  Language: Good  Akathisia:  No  Handed:  Right  AIMS (if indicated):     Assets:  Communication Skills Desire for Improvement  Sleep:       Musculoskeletal: Strength & Muscle Tone: within normal limits Gait & Station: normal Patient leans: N/A  Blood pressure 120/84, pulse (!) 126, temperature 98.2 F (36.8 C), temperature source Oral, resp. rate 18, SpO2 100 %.  Recommendations:  Psychiatric inpatient treatment once medically cleared  Based on my evaluation the patient appears to have an emergency medical condition for which I recommend the patient be transferred to the emergency department for further evaluation.  Ahley Bulls, NP 11/18/2018, 11:31 AM

## 2018-11-18 NOTE — ED Notes (Signed)
Pt oriented to room and unit.  Pt is calm and cooperative and eating a lunch tray.  All belongings were itemized and placed in locker 1.

## 2018-11-18 NOTE — BH Assessment (Signed)
Assessment Note  Samuel Reyes is an 63 y.o. male presenting voluntarily to [facility] for assessment complaining on suicidal ideation with a plan to jump into traffic. Patient states "I'm just tired of living. I'm tired of hurting. I don't want to do it anymore." Patient has accessed ED numerous times due to similar complaint. Patient was last hospitalized at [facility] on 10/03/2018 but did not follow up with outpatient referral to [facility] Reyes. Patient also reports alcohol use. His last use was 11/17/2018 and he drank 5 40 oz beers. He endorses withdrawal symptoms of sweating, chest pain, and reports experiencing DTs in the past. He denies any other substance use. Patient denies HI/AVH. He reports he started drinking 22 years ago when his 1 year old son died of spina bifida. He reports 1 prior suicide attempt by slitting his wrist 7 years ago after his wife died. Patient reports being homeless for 2 years but currently is staying at the [facility] through the [facility] due to Covid-19. Patient denies any current criminal charges or history of abuse.  Patient is alert and oriented x 4. He is dressed appropriately. Patient's speech is logical, eye contact is appropriate, and thoughts are organized. His mood is depressed and affect is congruent. Patient does not appear to be responding to internal stimuli or experiencing delusional thought content.  Diagnosis: F33.2 MDD, recurrent, severe   F10.20 Alcohol use disorder, severe  Past Medical History:  Past Medical History:  Diagnosis Date  . Alcoholism ([place])   . CAD (coronary artery disease) 2002   MI, no intervention required  . COPD (chronic obstructive pulmonary disease) (HCC)    not on home O2  . Depression   . GERD (gastroesophageal reflux disease)   . Headache   . Hypertension   . Stroke [facility]) 2006    Past Surgical History:  Procedure Laterality Date  . BACK SURGERY     3 cervical spine surgeries C4-C5 fused  . COLONOSCOPY N/A 01/04/2014   Procedure:  COLONOSCOPY;  Surgeon: Danie Binder, MD;  [facility];  Service: Endoscopy;  Laterality: N/A;  1:45  . ESOPHAGOGASTRODUODENOSCOPY N/A 01/04/2014   Procedure: ESOPHAGOGASTRODUODENOSCOPY (EGD);  Surgeon: Danie Binder, MD;  Location[facility];  Service: Endoscopy;  Laterality: N/A;  . FINGER SURGERY Left    2nd, 3rd, & 4th fingers were cut off by table saw and reattached  . GASTRECTOMY    . HERNIA REPAIR    . INCISIONAL HERNIA REPAIR N/A 01/20/2014   Procedure: LAPAROSCOPIC RECURRENT  INCISIONAL HERNIA with mesh;  Surgeon: Edward Jolly, MD;  Location: [facility] ORS;  Service: General;  Laterality: N/A;  . rt knee arthroscopic surgery    . SHOULDER SURGERY Bilateral    3 surgeries on on left, 2 surgeries on right     Family History:  Family History  Problem Relation Age of Onset  . Cancer Father        bone  . Cancer Brother        lungs  . Stroke Maternal Grandmother   . Asthma Son        died at age 36 in his sleep   . Spina bifida Son        died at age 28   . Dementia Mother   . Colon cancer Neg Hx     Social History:  reports that he has been smoking cigarettes. He started smoking about 52 years ago. He has a 104.00 pack-year smoking history. He  has never used smokeless tobacco. He reports current alcohol use of about 3.0 - 4.0 standard drinks of alcohol per week. He reports that he does not use drugs.  Additional Social History:  Alcohol / Drug Use Pain Medications: see MAR Prescriptions: see MAR Over the Counter: see MAR Longest period of sobriety (when/how long): UTA Substance #1 Name of Substance 1: alcohol 1 - Age of First Use: teens 1 - Amount (size/oz): varies 1 - Frequency: daily 1 - Duration: "years" 1 - Last Use / Amount: 5 40 oz beers 11/17/2018  CIWA: CIWA-Ar BP: 120/84 Pulse Rate: (!) 126 COWS:    Allergies:  Allergies  Allergen Reactions  . Bee Venom Anaphylaxis  . Penicillins Rash    Has patient had a PCN reaction causing  immediate rash, facial/tongue/throat swelling, SOB or lightheadedness with hypotension: {Yes Has patient had a PCN reaction causing severe rash involving mucus membranes or skin necrosis: YES Has patient had a PCN reaction that required hospitalization Yes Has patient had a PCN reaction occurring within the last 10 years: YES If all of the above answers are "NO", then may proceed with Cephalosporin use.   . Vancomycin Tinitus    Home Medications: (Not in a [facility] admission)   OB/GYN Status:  No LMP for male patient.  General Assessment Data Location of Assessment[facility] TTS Assessment: In system Is this a Tele or Face-to-Face Assessment?: Face-to-Face Is this an Initial Assessment or a Re-assessment for this encounter?: Initial Assessment Patient Accompanied by:: N/A Language Other than English: No Living Arrangements: Homeless/Shelter What gender do you identify as?: Male Marital status: Widowed Samuel Reyes name: Klang Pregnancy Status: No Living Arrangements: Alone Can pt return to current living arrangement?: Yes Admission Status: Voluntary Is patient capable of signing voluntary admission?: Yes Referral Source: Self/Family/Friend Insurance type: Medicaid     Crisis Care Plan Living Arrangements: Alone Legal Guardian: (self) Name of Psychiatrist: none Name of Therapist: none  Education Status Is patient currently in school?: No Is the patient employed, unemployed or receiving disability?: Receiving disability income  Risk to self with the past 6 months Suicidal Ideation: Yes-Currently Present Has patient been a risk to self within the past 6 months prior to admission? : Yes Suicidal Intent: Yes-Currently Present Has patient had any suicidal intent within the past 6 months prior to admission? : Yes Is patient at risk for suicide?: Yes Suicidal Plan?: Yes-Currently Present Has patient had any suicidal plan within the past 6 months prior to  admission? : Yes Specify Current Suicidal Plan: running into traffic Access to Means: No What has been your use of drugs/alcohol within the last 12 months?: alcohol Previous Attempts/Gestures: Yes How many times?: 1 Other Self Harm Risks: none noted Triggers for Past Attempts: Spouse contact(wife's death) Intentional Self Injurious Behavior: None Family Suicide History: No Recent stressful life event(s): Financial Problems, Loss (Comment)(death of son) Persecutory voices/beliefs?: No Depression: Yes Depression Symptoms: Despondent, Insomnia, Tearfulness, Isolating, Fatigue, Guilt, Loss of interest in usual pleasures, Feeling worthless/self pity, Feeling angry/irritable Substance abuse history and/or treatment for substance abuse?: Yes Suicide prevention information given to non-admitted patients: Not applicable  Risk to Others within the past 6 months Homicidal Ideation: No Does patient have any lifetime risk of violence toward others beyond the six months prior to admission? : No Thoughts of Harm to Others: No Current Homicidal Intent: No Current Homicidal Plan: No Access to Homicidal Means: No Identified Victim: none History of harm to others?: No Assessment of Violence: None  Noted Violent Behavior Description: none noted Does patient have access to weapons?: No Criminal Charges Pending?: No Does patient have a court date: No Is patient on probation?: No  Psychosis Hallucinations: None noted Delusions: None noted  Mental Status Report Appearance/Hygiene: Unremarkable Eye Contact: Fair Motor Activity: Freedom of movement Speech: Logical/coherent Level of Consciousness: Alert Mood: Depressed Affect: Depressed Anxiety Level: None Thought Processes: Coherent, Relevant Judgement: Impaired Orientation: Person, Place, Time, Situation Obsessive Compulsive Thoughts/Behaviors: None  Cognitive Functioning Concentration: Normal Memory: Recent Intact, Remote Intact Is  patient IDD: No Insight: Fair Impulse Control: Poor Appetite: Fair Have you had any weight changes? : No Change Sleep: No Change Total Hours of Sleep: 8 Vegetative Symptoms: None  ADLScreening [facility]) Patient's cognitive ability adequate to safely complete daily activities?: Yes Patient able to express need for assistance with ADLs?: Yes Independently performs ADLs?: Yes (appropriate for developmental age)  Prior Inpatient Therapy Prior Inpatient Therapy: Yes Prior Therapy Dates: 2020, 2019 Prior Therapy Facilty/Provider(s): [facility] Reason for Treatment: depression, alcohol use  Prior Outpatient Therapy Prior Outpatient Therapy: Yes Prior Therapy Dates: 2019 Prior Therapy Facilty/Provider(s): [facility] Reason for Treatment: alcoholism, depression Does patient have an ACCT team?: No Does patient have Intensive In-House Services?  : No Does patient have [facility] services? : No Does patient have P4CC services?: No  ADL Screening (condition at time of admission) Patient's cognitive ability adequate to safely complete daily activities?: Yes Is the patient deaf or have difficulty hearing?: No Does the patient have difficulty seeing, even when wearing glasses/contacts?: Yes Does the patient have difficulty concentrating, remembering, or making decisions?: Yes Patient able to express need for assistance with ADLs?: Yes Does the patient have difficulty dressing or bathing?: No Independently performs ADLs?: Yes (appropriate for developmental age) Does the patient have difficulty walking or climbing stairs?: Yes Weakness of Legs: Both Weakness of Arms/Hands: Both  Home Assistive Devices/Equipment Home Assistive Devices/Equipment: (front wheel)  Therapy Consults (therapy consults require a physician order) PT Evaluation Needed: No OT Evalulation Needed: No SLP Evaluation Needed: No Abuse/Neglect Assessment (Assessment to be complete while patient is  alone) Physical Abuse: Denies Verbal Abuse: Denies Sexual Abuse: Denies Exploitation of patient/patient's resources: Denies Self-Neglect: Denies Values / Beliefs Cultural Requests During Hospitalization: None Spiritual Requests During Hospitalization: None Consults Spiritual Care Consult Needed: No Social Work Consult Needed: No Regulatory affairs officer (For Healthcare) Does Patient Have a Medical Advance Directive?: No Would patient like information on creating a medical advance directive?: No - Patient declined          Disposition: Per Earleen Newport, NP patient meets in patient criteria. Transferred to [facility] for medical clearance. Disposition Initial Assessment Completed for this Encounter: Yes Disposition of Patient: Movement to [facility] or [facility] Patient refused recommended treatment: No  On Site Evaluation by:   Reviewed with Physician:    Orvis Brill 11/18/2018 12:07 PM

## 2018-11-18 NOTE — ED Notes (Signed)
Pt wanded by security, placed in purple scrubs, all belongings secured.

## 2018-11-18 NOTE — ED Provider Notes (Signed)
[facility] EMERGENCY DEPARTMENT Provider Note   CSN: [id number] Arrival date & time: 11/18/18  1228    History   Chief Complaint No chief complaint on file.   HPI Samuel Reyes is a 63 y.o. male.     HPI   63 year old male presents today with complaints of suicidal ideation.  Patient notes that he has been feeling bad for "a while".  He notes he does not want to be here anymore.  He reports that he is thought about walking in front of traffic.  He is uncertain why he has not done this yet.  He does not have any homicidal ideations.  He reports he is tired of being alone.  When asked about any physical complaints he reports he has had several surgeries over his life and his body is shutting down.   He notes he fell getting out of the shower last week. He has pain across his chest from this. Worse with palpation.   Past Medical History:  Diagnosis Date  . Alcoholism ([place])   . CAD (coronary artery disease) 2002   MI, no intervention required  . COPD (chronic obstructive pulmonary disease) (HCC)    not on home O2  . Depression   . GERD (gastroesophageal reflux disease)   . Headache   . Hypertension   . Stroke [facility]) 2006    Patient Active Problem List   Diagnosis Date Noted  . QT prolongation 09/09/2018  . Overdose 09/06/2018  . MDD (major depressive disorder), recurrent episode, severe ([place]) 07/18/2018  . MDD (major depressive disorder), recurrent severe, without psychosis ([place]) 08/26/2017  . Alcohol withdrawal ([place]) 08/08/2017  . COPD with acute bronchitis ([place]) 07/29/2017  . Alcohol use   . Homelessness 06/24/2017  . Lung nodule 09/03/2016  . Tobacco abuse 09/03/2016  . Insomnia 09/03/2016  . HCAP (healthcare-associated pneumonia) 08/22/2016  . Bronchiolitis 08/21/2016  . Malnutrition of moderate degree 08/04/2016  . Atrial tachycardia ([place]) 08/02/2016  . Impaired glucose tolerance 08/02/2016  . Alcohol abuse with alcohol-induced mood disorder  ([place]) 12/12/2015  . Syncope 11/15/2015  . Chest pain 11/15/2015  . Nausea vomiting and diarrhea 11/15/2015  . Abdominal pain 11/15/2015  . Major depressive disorder, recurrent episode, moderate with anxious distress ([place]) 10/30/2015  . Alcohol use disorder, severe, dependence ([place]) 10/29/2015  . COPD exacerbation ([place]) 09/20/2015  . Acute respiratory failure with hypoxia ([place]) 09/18/2015  . Suicidal ideation 09/17/2015  . Alcohol intoxication ([place]) 09/17/2015  . Depression 09/17/2015  . Benign essential HTN 09/17/2015  . Hypokalemia 09/17/2015  . Hyponatremia 09/17/2015  . Coffee ground emesis 09/17/2015  . COPD (chronic obstructive pulmonary disease) ([place]) 02/17/2013  . GERD (gastroesophageal reflux disease) 02/17/2013    Past Surgical History:  Procedure Laterality Date  . BACK SURGERY     3 cervical spine surgeries C4-C5 fused  . COLONOSCOPY N/A 01/04/2014   Procedure: COLONOSCOPY;  Surgeon: Danie Binder, MD;  [facility];  Service: Endoscopy;  Laterality: N/A;  1:45  . ESOPHAGOGASTRODUODENOSCOPY N/A 01/04/2014   Procedure: ESOPHAGOGASTRODUODENOSCOPY (EGD);  Surgeon: Danie Binder, MD;  Location: [facility];  Service: Endoscopy;  Laterality: N/A;  . FINGER SURGERY Left    2nd, 3rd, & 4th fingers were cut off by table saw and reattached  . GASTRECTOMY    . HERNIA REPAIR    . INCISIONAL HERNIA REPAIR N/A 01/20/2014   Procedure: LAPAROSCOPIC RECURRENT  INCISIONAL HERNIA with mesh;  Surgeon: Edward Jolly, MD;  Location:  [facility] ORS;  Service: General;  Laterality: N/A;  . rt knee arthroscopic surgery    . SHOULDER SURGERY Bilateral    3 surgeries on on left, 2 surgeries on right         Home Medications    Prior to Admission medications   Medication Sig Start Date End Date Taking? Authorizing Provider  acetaminophen (TYLENOL) 325 MG tablet Take 2 tablets (650 mg total) by mouth every 6 (six) hours as needed for mild pain. 10/08/18   Connye Burkitt, NP   albuterol (PROVENTIL HFA;VENTOLIN HFA) 108 (90 Base) MCG/ACT inhaler Inhale 2 puffs into the lungs every 4 (four) hours as needed for wheezing or shortness of breath. 07/22/18   Lindell Spar I, NP  folic acid (FOLVITE) 1 MG tablet Take 1 mg by mouth daily. 06/18/18 08/01/19  [provider]  Multiple Vitamin (MULTIVITAMIN WITH MINERALS) TABS tablet Take 1 tablet by mouth daily. Vitamin supplementation 07/23/18   Lindell Spar I, NP  nicotine (NICODERM CQ - DOSED IN MG/24 HOURS) 21 mg/24hr patch Place 1 patch (21 mg total) onto the skin daily. For smoking cessation 10/08/18   Connye Burkitt, NP  pantoprazole (PROTONIX) 40 MG tablet Take 1 tablet (40 mg total) by mouth daily. For reflux 10/08/18   Connye Burkitt, NP  prazosin (MINIPRESS) 1 MG capsule Take 1 capsule (1 mg total) by mouth at bedtime. For nightmares 10/08/18   Connye Burkitt, NP  sertraline (ZOLOFT) 50 MG tablet Take 1 tablet (50 mg total) by mouth daily. For mood 10/08/18   Connye Burkitt, NP  thiamine 100 MG tablet Take 1 tablet (100 mg total) by mouth daily. 09/12/18   Rai, Vernelle Emerald, MD  tiotropium (SPIRIVA) 18 MCG inhalation capsule Place 1 capsule (18 mcg total) into inhaler and inhale daily. For COPD 07/23/18   Lindell Spar I, NP  traZODone (DESYREL) 100 MG tablet Take 1 tablet (100 mg total) by mouth at bedtime and may repeat dose one time if needed. For sleep 10/08/18   Connye Burkitt, NP    Family History Family History  Problem Relation Age of Onset  . Cancer Father        bone  . Cancer Brother        lungs  . Stroke Maternal Grandmother   . Asthma Son        died at age 87 in his sleep   . Spina bifida Son        died at age 21   . Dementia Mother   . Colon cancer Neg Hx     Social History Social History   Tobacco Use  . Smoking status: Current Every Day Smoker    Packs/day: 2.00    Years: 52.00    Pack years: 104.00    Types: Cigarettes    Start date: 07/30/1966  . Smokeless tobacco: Never Used  .  Tobacco comment: down to 1/4 ppd  Substance Use Topics  . Alcohol use: Yes    Alcohol/week: 3.0 - 4.0 standard drinks    Types: 3 - 4 Cans of beer per week    Comment: 4-5 40's a day   . Drug use: No    Comment: denied using any drugs     Allergies   Bee venom; Penicillins; and Vancomycin   Review of Systems Review of Systems  All other systems reviewed and are negative.    Physical Exam Updated Vital Signs BP 128/86 (BP Location: Right  Arm)   Pulse (!) 106   Temp 98.2 F (36.8 C) (Oral)   Resp 18   SpO2 99%   Physical Exam Vitals signs and nursing note reviewed.  Constitutional:      Appearance: He is well-developed.  HENT:     Head: Normocephalic and atraumatic.  Eyes:     General: No scleral icterus.       Right eye: No discharge.        Left eye: No discharge.     Conjunctiva/sclera: Conjunctivae normal.     Pupils: Pupils are equal, round, and reactive to light.  Neck:     Musculoskeletal: Normal range of motion.     Vascular: No JVD.     Trachea: No tracheal deviation.  Pulmonary:     Effort: Pulmonary effort is normal. No respiratory distress.     Breath sounds: Normal breath sounds. No stridor. No wheezing, rhonchi or rales.     Comments: No bruising Chest:     Chest wall: No tenderness.  Neurological:     Mental Status: He is alert and oriented to person, place, and time.     Coordination: Coordination normal.  Psychiatric:        Behavior: Behavior normal.        Thought Content: Thought content normal.        Judgment: Judgment normal.      ED Treatments / Results  Labs (all labs ordered are listed, but only abnormal results are displayed) Labs Reviewed  CBC - Abnormal; Notable for the following components:      Result Value   RDW 16.5 (*)    All other components within normal limits  COMPREHENSIVE METABOLIC PANEL - Abnormal; Notable for the following components:   Alkaline Phosphatase 134 (*)    All other components within normal  limits  ETHANOL - Abnormal; Notable for the following components:   Alcohol, Ethyl (B) 42 (*)    All other components within normal limits  ACETAMINOPHEN LEVEL - Abnormal; Notable for the following components:   Acetaminophen (Tylenol), Serum <10 (*)    All other components within normal limits  TROPONIN I  SALICYLATE LEVEL  RAPID URINE DRUG SCREEN, HOSP PERFORMED    EKG None  Radiology Dg Chest 2 View  Result Date: 11/18/2018 CLINICAL DATA:  Chest pain and shortness of breath EXAM: CHEST - 2 VIEW COMPARISON:  Chest radiograph October 25, 2018 and chest CT October 25, 2018 FINDINGS: There is no appreciable edema or consolidation. Heart size and pulmonary vascularity are normal. No adenopathy. There old healed rib fractures on the right. There are surgical clips at the gastroesophageal junction level. There is aortic atherosclerosis. IMPRESSION: No edema or consolidation. Heart size normal. Aortic Atherosclerosis (ICD10-I70.0). Electronically Signed   By: Lowella Grip III M.D.   On: 11/18/2018 13:07    Procedures Procedures (including critical care time)  Medications Ordered in ED Medications  sodium chloride flush (NS) 0.9 % injection 3 mL (has no administration in time range)  acetaminophen (TYLENOL) tablet 650 mg (has no administration in time range)  albuterol (VENTOLIN HFA) 108 (90 Base) MCG/ACT inhaler 2 puff (has no administration in time range)  folic acid (FOLVITE) tablet 1 mg (has no administration in time range)  nicotine (NICODERM CQ - dosed in mg/24 hours) patch 21 mg (has no administration in time range)  prazosin (MINIPRESS) capsule 1 mg (has no administration in time range)  sertraline (ZOLOFT) tablet 50 mg (has no administration in time  range)  traZODone (DESYREL) tablet 100 mg (has no administration in time range)  LORazepam (ATIVAN) injection 0-4 mg (has no administration in time range)    Or  LORazepam (ATIVAN) tablet 0-4 mg (has no administration in time  range)  LORazepam (ATIVAN) injection 0-4 mg (has no administration in time range)    Or  LORazepam (ATIVAN) tablet 0-4 mg (has no administration in time range)  thiamine (VITAMIN B-1) tablet 100 mg (has no administration in time range)    Or  thiamine (B-1) injection 100 mg (has no administration in time range)     Initial Impression / Assessment and Plan / ED Course  I have reviewed the triage vital signs and the nursing notes.  Pertinent labs & imaging results that were available during my care of the patient were reviewed by me and considered in my medical decision making (see chart for details).       63 year old male presents today with suicidal ideation.  Patient is medically cleared awaiting TTS evaluation.  Final Clinical Impressions(s) / ED Diagnoses   Final diagnoses:  Suicidal ideation    ED Discharge Orders    None       Francee Gentile 11/18/18 1453    Lennice Sites, DO 11/18/18 [place], [place], DO 11/18/18 1549

## 2018-11-18 NOTE — ED Triage Notes (Signed)
Pt reports suicidal thoughts for "a while", states he plans to walk into traffic, denies homicidal thoughts, hasn't been taking meds, cooperative. Reports right and left lower CP x 3 days. Last drink of alcohol yesterday.

## 2018-11-18 NOTE — Progress Notes (Signed)
Pt meets inpatient criteria per Earleen Newport, NP. Referral information has been sent to the following hospitals for review:  [facility], [place] [facility], [facility], [facility], [place], [place], [facility], [place], [place], [place], [place]  Disposition will continue to assist with pt's inpatient placement need.   Audree Camel, LCSW, [place] Disposition [facility]/TTS [phone]

## 2018-11-19 ENCOUNTER — Encounter (HOSPITAL_COMMUNITY): Payer: Self-pay | Admitting: Registered Nurse

## 2018-11-19 DIAGNOSIS — R45851 Suicidal ideations: Secondary | ICD-10-CM

## 2018-11-19 DIAGNOSIS — F1094 Alcohol use, unspecified with alcohol-induced mood disorder: Secondary | ICD-10-CM | POA: Diagnosis not present

## 2018-11-19 DIAGNOSIS — Z590 Homelessness: Secondary | ICD-10-CM

## 2018-11-19 DIAGNOSIS — F1020 Alcohol dependence, uncomplicated: Secondary | ICD-10-CM

## 2018-11-19 MED ORDER — SERTRALINE HCL 50 MG PO TABS: 50.0000 mg | ORAL_TABLET | Freq: Every day | ORAL | 0 refills | Status: DC

## 2018-11-19 MED ORDER — TRAZODONE HCL 100 MG PO TABS: 100.0000 mg | ORAL_TABLET | Freq: Every evening | ORAL | 0 refills | Status: DC | PRN

## 2018-11-19 MED ORDER — PRAZOSIN HCL 1 MG PO CAPS: 1.0000 mg | ORAL_CAPSULE | Freq: Every day | ORAL | 0 refills | Status: DC

## 2018-11-19 NOTE — ED Notes (Signed)
Pt DCd off unit to home per MD order.  DC instruction reviewed and given to pt with acknowledged understanding. Pt given resources and follow up information. Belongings given to pt. Pt ambulated off unit escorted tech. Pt given direction to bus stop.

## 2018-11-19 NOTE — ED Notes (Signed)
Tech walked pt to bus stop.

## 2018-11-19 NOTE — ED Notes (Signed)
Breakfast ordered 
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
## 2018-11-19 NOTE — ED Provider Notes (Signed)
63 year old male here with suicidal ideations.  Patient seen by behavioral health with recommendations for outpatient follow-up.  I did discuss with patient at bedside for outpatient follow-up recommendations.  He understood and agreed to today's plan.  Vitals:   11/19/18 0822 11/19/18 1321  BP: 138/69 125/68  Pulse: (!) 105 97  Resp:    Temp: 98 F (36.7 C)   SpO2: 95%      Okey Regal, PA-C 11/19/18 1354    Lennice Sites, DO 11/19/18 1441

## 2018-11-19 NOTE — Consult Note (Signed)
Telepsych Consultation   Reason for Consult:  Suicidal ideation Referring Physician:  Okey Regal, PA-C Location of Patient: [facility] Location of Provider: [facility]  Patient Identification: Samuel Reyes MRN:  [id number] Principal Diagnosis: Alcohol-induced mood disorder ([place]) Diagnosis:  Principal Problem:   Alcohol-induced mood disorder ([place]) Active Problems:   Suicidal ideation   Alcohol use disorder, severe, dependence ([place])   Homelessness   Total Time spent with patient: 30 minutes  Subjective:   Samuel Reyes is a 63 y.o. male  patient presented to [facility] as a walk in 11/18/18 with complaints of suicidal ideation and alcohol abuse with history of DT's when withdrawal from alcohol.  Patient was sent to [facility] for medical clearance and observation related to his history of DT and the complaint of chest pain, and other respiratory complaints.  Patient has had 3 psychiatric hospitalizations and 9 ED admissions in the last 6 months.  Patient last discharge from psychiatric hospitalization was 10/08/18 and last ED visit/psychiatric consult was 10/26/18.    This visit patient UDS negative; ETOH 42.   Patient is homeless but is currently stay at [facility] which is provided by the [facility].  Patient has a history of not following up with resources provided for psychiatric treatment.  Patient was to follow up with [facility] after his last psychiatric hospitalization which he did not.   HPI:  Patient seen via tele psych by this provider; chart reviewed and consulted with Dr. Dwyane Dee on 11/19/18.  On evaluation Samuel Reyes reports that he is still not feeling well "I have the shakes, itching, and headache.  Patient reports that he does not follow up with any appointments because he doesn't have any transportation and no where to get there.  States that he has Medicaid but has never heard of Medicaid transport.  Patient states that if he is able to get to his appointments he would be fine.   Patient reports his main concern is getting to and from his appointments and getting his medication.  Patient states he has money in the bank but is unable to get it related to not having an ID which the [facility] is in the process of helping him get another one.  Patient states he did not ask anyone at [facility] if they could help with his medication because he didn't know they could but would ask today if he was released early enough to get there.  At this time patient denies suicidal/self-harm/homicidal ideation, psychosis, and paranoia.  Patient informed would get him the contact information for Medicaid transport but would need set appointments up in advance and not the day of.  Also informed that bus transportation is free at this time and he could also use that for transportation.     During evaluation Samuel Reyes is sitting on side of bed; he is alert/oriented x 4; calm/cooperative; and mood congruent with affect.  Patient is speaking in a clear tone at moderate volume, and normal pace; with good eye contact.  His thought process is coherent and relevant; There is no indication that he is currently responding to internal/external stimuli or experiencing delusional thought content.  Patient denies suicidal/self-harm/homicidal ideation, psychosis, and paranoia.  Patient has remained calm throughout assessment and has answered questions appropriately  Past Psychiatric History: MDD, Substance induced mood disorder; alcohol use disorder  Risk to Self:   No Risk to Others:  No Prior Inpatient Therapy:  Yes Prior Outpatient Therapy:  Yes  Past Medical History:  Past Medical  History:  Diagnosis Date  . Alcoholism ([place])   . CAD (coronary artery disease) 2002   MI, no intervention required  . COPD (chronic obstructive pulmonary disease) (HCC)    not on home O2  . Depression   . GERD (gastroesophageal reflux disease)   . Headache   . Hypertension   . Stroke [facility]) 2006    Past Surgical History:   Procedure Laterality Date  . BACK SURGERY     3 cervical spine surgeries C4-C5 fused  . COLONOSCOPY N/A 01/04/2014   Procedure: COLONOSCOPY;  Surgeon: Danie Binder, MD;  Location: [facility] ENDO SUITE;  Service: Endoscopy;  Laterality: N/A;  1:45  . ESOPHAGOGASTRODUODENOSCOPY N/A 01/04/2014   Procedure: ESOPHAGOGASTRODUODENOSCOPY (EGD);  Surgeon: Danie Binder, MD;  Location: [facility] ENDO SUITE;  Service: Endoscopy;  Laterality: N/A;  . FINGER SURGERY Left    2nd, 3rd, & 4th fingers were cut off by table saw and reattached  . GASTRECTOMY    . HERNIA REPAIR    . INCISIONAL HERNIA REPAIR N/A 01/20/2014   Procedure: LAPAROSCOPIC RECURRENT  INCISIONAL HERNIA with mesh;  Surgeon: Edward Jolly, MD;  Location: [facility] ORS;  Service: General;  Laterality: N/A;  . rt knee arthroscopic surgery    . SHOULDER SURGERY Bilateral    3 surgeries on on left, 2 surgeries on right    Family History:  Family History  Problem Relation Age of Onset  . Cancer Father        bone  . Cancer Brother        lungs  . Stroke Maternal Grandmother   . Asthma Son        died at age 36 in his sleep   . Spina bifida Son        died at age 12   . Dementia Mother   . Colon cancer Neg Hx    Family Psychiatric  History: MDD,  Social History:  Social History   Substance and Sexual Activity  Alcohol Use Yes  . Alcohol/week: 3.0 - 4.0 standard drinks  . Types: 3 - 4 Cans of beer per week   Comment: 4-5 40's a day      Social History   Substance and Sexual Activity  Drug Use No   Comment: denied using any drugs    Social History   Socioeconomic History  . Marital status: Widowed    Spouse name: Not on file  . Number of children: 3  . Years of education: Not on file  . Highest education level: Not on file  Occupational History  . Occupation: Disability  Social Needs  . Financial resource strain: Not on file  . Food insecurity:    Worry: Not on file    Inability: Not on file  . Transportation needs:     Medical: Yes    Non-medical: Yes  Tobacco Use  . Smoking status: Current Every Day Smoker    Packs/day: 2.00    Years: 52.00    Pack years: 104.00    Types: Cigarettes    Start date: 07/30/1966  . Smokeless tobacco: Never Used  . Tobacco comment: down to 1/4 ppd  Substance and Sexual Activity  . Alcohol use: Yes    Alcohol/week: 3.0 - 4.0 standard drinks    Types: 3 - 4 Cans of beer per week    Comment: 4-5 40's a day   . Drug use: No    Comment: denied using any drugs  . Sexual activity:  Not Currently  Lifestyle  . Physical activity:    Days per week: Not on file    Minutes per session: Not on file  . Stress: Not on file  Relationships  . Social connections:    Talks on phone: Not on file    Gets together: Not on file    Attends religious service: Not on file    Active member of club or organization: Not on file    Attends meetings of clubs or organizations: Not on file    Relationship status: Not on file  Other Topics Concern  . Not on file  Social History Narrative  . Not on file   Additional Social History:    Allergies:   Allergies  Allergen Reactions  . Bee Venom Anaphylaxis  . Penicillins Rash    Has patient had a PCN reaction causing immediate rash, facial/tongue/throat swelling, SOB or lightheadedness with hypotension: {Yes Has patient had a PCN reaction causing severe rash involving mucus membranes or skin necrosis: YES Has patient had a PCN reaction that required hospitalization Yes Has patient had a PCN reaction occurring within the last 10 years: YES If all of the above answers are "NO", then may proceed with Cephalosporin use.   . Vancomycin Tinitus    Labs:  Results for orders placed or performed during the hospital encounter of 11/18/18 (from the past 48 hour(s))  CBC     Status: Abnormal   Collection Time: 11/18/18 12:40 PM  Result Value Ref Range   WBC 6.1 4.0 - 10.5 K/uL   RBC 5.02 4.22 - 5.81 MIL/uL   Hemoglobin 13.5 13.0 - 17.0 g/dL    HCT 43.0 39.0 - 52.0 %   MCV 85.7 80.0 - 100.0 fL   MCH 26.9 26.0 - 34.0 pg   MCHC 31.4 30.0 - 36.0 g/dL   RDW 16.5 (H) 11.5 - 15.5 %   Platelets 322 150 - 400 K/uL   nRBC 0.0 0.0 - 0.2 %    Comment: Performed at [facility], [place] [address], [place], [place] [address]  Troponin I - ONCE - STAT     Status: None   Collection Time: 11/18/18 12:40 PM  Result Value Ref Range   Troponin I <0.03 <0.03 ng/mL    Comment: Performed at [facility], [place] [address], [place], [place] [address]  Comprehensive metabolic panel     Status: Abnormal   Collection Time: 11/18/18 12:41 PM  Result Value Ref Range   Sodium 139 135 - 145 mmol/L   Potassium 4.4 3.5 - 5.1 mmol/L   Chloride 103 98 - 111 mmol/L   CO2 25 22 - 32 mmol/L   Glucose, Bld 92 70 - 99 mg/dL   BUN 9 8 - 23 mg/dL   Creatinine, Ser 0.89 0.61 - 1.24 mg/dL   Calcium 9.1 8.9 - 10.3 mg/dL   Total Protein 7.1 6.5 - 8.1 g/dL   Albumin 3.7 3.5 - 5.0 g/dL   AST 22 15 - 41 U/L   ALT 15 0 - 44 U/L   Alkaline Phosphatase 134 (H) 38 - 126 U/L   Total Bilirubin 0.3 0.3 - 1.2 mg/dL   GFR calc non Af Amer >60 >60 mL/min   GFR calc Af Amer >60 >60 mL/min   Anion gap 11 5 - 15    Comment: Performed at [facility] [address] [place], [place] [address]  Ethanol     Status: Abnormal  Collection Time: 11/18/18 12:41 PM  Result Value Ref Range   Alcohol, Ethyl (B) 42 (H) <10 mg/dL    Comment: (NOTE) Lowest detectable limit for serum alcohol is 10 mg/dL. For medical purposes only. Performed at [facility], [place] [address] [place], [place] [address]   Salicylate level     Status: None   Collection Time: 11/18/18 12:41 PM  Result Value Ref Range   Salicylate Lvl <1.1 2.8 - 30.0 mg/dL    Comment: Performed at [facility] [address] [place], [place] [address]  Acetaminophen level     Status: Abnormal   Collection Time: 11/18/18 12:41 PM  Result Value Ref Range   Acetaminophen (Tylenol), Serum <10 (L)  10 - 30 ug/mL    Comment: (NOTE) Therapeutic concentrations vary significantly. A range of 10-30 ug/mL  may be an effective concentration for many patients. However, some  are best treated at concentrations outside of this range. Acetaminophen concentrations >150 ug/mL at 4 hours after ingestion  and >50 ug/mL at 12 hours after ingestion are often associated with  toxic reactions. Performed at [facility], [place] [address] [place], [place] [address]   Rapid urine drug screen (hospital performed)     Status: None   Collection Time: 11/18/18  3:00 PM  Result Value Ref Range   Opiates NONE DETECTED NONE DETECTED   Cocaine NONE DETECTED NONE DETECTED   Benzodiazepines NONE DETECTED NONE DETECTED   Amphetamines NONE DETECTED NONE DETECTED   Tetrahydrocannabinol NONE DETECTED NONE DETECTED   Barbiturates NONE DETECTED NONE DETECTED    Comment: (NOTE) DRUG SCREEN FOR MEDICAL PURPOSES ONLY.  IF CONFIRMATION IS NEEDED FOR ANY PURPOSE, NOTIFY LAB WITHIN 5 DAYS. LOWEST DETECTABLE LIMITS FOR URINE DRUG SCREEN Drug Class                     Cutoff (ng/mL) Amphetamine and metabolites    1000 Barbiturate and metabolites    200 Benzodiazepine                 818 Tricyclics and metabolites     300 Opiates and metabolites        300 Cocaine and metabolites        300 THC                            50 Performed at [facility], [place] [address] [place],  [address]     Medications:  Current Facility-Administered Medications  Medication Dose Route Frequency Provider Last Rate Last Dose  . acetaminophen (TYLENOL) tablet 650 mg  650 mg Oral Q6H PRN Okey Regal, PA-C   650 mg at 11/19/18 0953  . albuterol (VENTOLIN HFA) 108 (90 Base) MCG/ACT inhaler 2 puff  2 puff Inhalation Q4H PRN Hedges, Dellis Filbert, PA-C      . folic acid (FOLVITE) tablet 1 mg  1 mg Oral Daily Hedges, Jeffrey, PA-C   1 mg at 11/19/18 0953  . LORazepam (ATIVAN) injection 0-4 mg  0-4 mg Intravenous Q6H  Hedges, Jeffrey, PA-C       Or  . LORazepam (ATIVAN) tablet 0-4 mg  0-4 mg Oral Q6H Hedges, Jeffrey, PA-C   1 mg at 11/18/18 2133  . [START ON 11/20/2018] LORazepam (ATIVAN) injection 0-4 mg  0-4 mg Intravenous Q12H Hedges, Jeffrey, PA-C       Or  . Derrill Memo ON 11/20/2018] LORazepam (ATIVAN) tablet  0-4 mg  0-4 mg Oral Q12H Hedges, Jeffrey, PA-C      . nicotine (NICODERM CQ - dosed in mg/24 hours) patch 21 mg  21 mg Transdermal Daily Hedges, Dellis Filbert, PA-C   21 mg at 11/19/18 0953  . prazosin (MINIPRESS) capsule 1 mg  1 mg Oral QHS Hedges, Jeffrey, PA-C   1 mg at 11/18/18 2133  . sertraline (ZOLOFT) tablet 50 mg  50 mg Oral Daily Okey Regal, PA-C   50 mg at 11/19/18 0953  . sodium chloride flush (NS) 0.9 % injection 3 mL  3 mL Intravenous Once Sherwood Gambler, MD      . thiamine (VITAMIN B-1) tablet 100 mg  100 mg Oral Daily Okey Regal, PA-C   100 mg at 11/19/18 8546   Or  . thiamine (B-1) injection 100 mg  100 mg Intravenous Daily Hedges, Dellis Filbert, PA-C      . traZODone (DESYREL) tablet 100 mg  100 mg Oral QHS,MR X 1 Hedges, Jeffrey, PA-C   100 mg at 11/18/18 2132   Current Outpatient Medications  Medication Sig Dispense Refill  . pantoprazole (PROTONIX) 40 MG tablet Take 1 tablet (40 mg total) by mouth daily. For reflux 30 tablet 0  . traZODone (DESYREL) 100 MG tablet Take 1 tablet (100 mg total) by mouth at bedtime and may repeat dose one time if needed. For sleep 30 tablet 0  . acetaminophen (TYLENOL) 325 MG tablet Take 2 tablets (650 mg total) by mouth every 6 (six) hours as needed for mild pain. (Patient not taking: Reported on 11/18/2018)    . albuterol (PROVENTIL HFA;VENTOLIN HFA) 108 (90 Base) MCG/ACT inhaler Inhale 2 puffs into the lungs every 4 (four) hours as needed for wheezing or shortness of breath. (Patient not taking: Reported on 11/18/2018)    . Multiple Vitamin (MULTIVITAMIN WITH MINERALS) TABS tablet Take 1 tablet by mouth daily. Vitamin supplementation (Patient not taking:  Reported on 11/18/2018)    . nicotine (NICODERM CQ - DOSED IN MG/24 HOURS) 21 mg/24hr patch Place 1 patch (21 mg total) onto the skin daily. For smoking cessation (Patient not taking: Reported on 11/18/2018) 28 patch 0  . prazosin (MINIPRESS) 1 MG capsule Take 1 capsule (1 mg total) by mouth at bedtime. For nightmares (Patient not taking: Reported on 11/18/2018) 30 capsule 0  . sertraline (ZOLOFT) 50 MG tablet Take 1 tablet (50 mg total) by mouth daily. For mood (Patient not taking: Reported on 11/18/2018) 30 tablet 0  . thiamine 100 MG tablet Take 1 tablet (100 mg total) by mouth daily. (Patient not taking: Reported on 11/18/2018) 30 tablet 3  . tiotropium (SPIRIVA) 18 MCG inhalation capsule Place 1 capsule (18 mcg total) into inhaler and inhale daily. For COPD (Patient not taking: Reported on 11/18/2018) 30 capsule 12    Musculoskeletal: Strength & Muscle Tone: within normal limits Gait & Station: normal Patient leans: N/A  Psychiatric Specialty Exam: Physical Exam  Vitals reviewed. Constitutional: He is oriented to person, place, and time.  Neck: Normal range of motion.  Respiratory: Effort normal.  Musculoskeletal: Normal range of motion.  Neurological: He is alert and oriented to person, place, and time.  Skin: Skin is warm and dry.  Psychiatric: His speech is normal and behavior is normal. Thought content normal. Anxious: Stable. Cognition and memory are normal. Depressed: Stable.    Review of Systems  Psychiatric/Behavioral: Depression: Stable. Hallucinations: Denies. Substance abuse: ETOH, cocaine. Suicidal ideas: Denies. Nervous/anxious: Denies.   All other systems reviewed and are  negative.   Blood pressure 138/69, pulse (!) 105, temperature 98 F (36.7 C), temperature source Oral, resp. rate 18, SpO2 95 %.There is no height or weight on file to calculate BMI.  General Appearance: Casual  Eye Contact:  Good  Speech:  Clear and Coherent and Normal Rate  Volume:  Normal  Mood:   Depressed  Affect:  Appropriate and Congruent  Thought Process:  Coherent and Goal Directed  Orientation:  Full (Time, Place, and Person)  Thought Content:  WDL  Suicidal Thoughts:  No  Homicidal Thoughts:  No  Memory:  Immediate;   Good Recent;   Good Remote;   Good  Judgement:  Intact  Insight:  Good  Psychomotor Activity:  Normal  Concentration:  Concentration: Good and Attention Span: Good  Recall:  Good  Fund of Knowledge:  Fair  Language:  Good  Akathisia:  No  Handed:  Right  AIMS (if indicated):     Assets:  Communication Skills Desire for Improvement Housing  ADL's:  Intact  Cognition:  WNL  Sleep:        Treatment Plan Summary: Plan Follow up with [facility].  Give resources for Medicaid transport  Give prescription for home medications Zoloft, Trazodone, and Minipress Follow up with [facility]   Disposition: No evidence of imminent risk to self or others at present.   Patient does not meet criteria for psychiatric inpatient admission. Supportive therapy provided about ongoing stressors. Discussed crisis plan, support from social network, calling 911, coming to the Emergency Department, and calling Suicide Hotline.  This service was provided via telemedicine using a 2-way, interactive audio and video technology.  Names of all persons participating in this telemedicine service and their role in this encounter. Name: Samuel Newport, NP Role: Tele psych assessment  Name: Dr. Dwyane Dee Role: Psychiatrist  Name: Samuel Reyes Role: Patient   Name: Lenn Sink, PA Role: Informed of above recommendation and disposition    Samuel Rankin, NP 11/19/2018 10:54 AM

## 2018-11-19 NOTE — Discharge Instructions (Signed)
Please read attached information. If you experience any new or worsening signs or symptoms please return to the emergency room for evaluation. Please follow-up with your primary care provider or specialist as discussed.  °

## 2018-11-24 ENCOUNTER — Inpatient Hospital Stay: Payer: Self-pay | Admitting: Primary Care

## 2018-11-24 ENCOUNTER — Telehealth: Payer: Self-pay | Admitting: Pediatric Intensive Care

## 2018-11-24 NOTE — Telephone Encounter (Signed)
Call to client for referral follow up. Verified x 2 identifiers. Client states he is feeling OK except he is "lonely, bored." He states his "body id wearing out". He states that he lost his wallet and ID. He states he knows he needs to stop drinking. Cn advised follow up with [facility] (gave number to schedule appointment) Client would like to re-estalbish care at [facility]. CN advised that they would need to call him at the hotel. Client gives consent to have [facility] staff call him. CN gave her contact number for any questions or concerns. Lisette Abu RN BSN CNP [phone]

## 2018-11-25 NOTE — Progress Notes (Signed)
[place] Screening performed. Temperature, PHQ-9, and need for medical care and medications assessed. PHQ-9=27. Lisette Abu is working with patient at this time. Patient states that when he went to the hospital last week, they kept his wallet. Call made to Security at [facility] to see if they have located his wallet, did not have it in holding, but they are going to continue to search. Referral made to [facility].  Arnold Long RN MSN

## 2018-11-26 ENCOUNTER — Inpatient Hospital Stay: Payer: Self-pay | Admitting: Primary Care

## 2018-11-26 ENCOUNTER — Telehealth: Payer: Self-pay | Admitting: Pediatric Intensive Care

## 2018-11-26 NOTE — Telephone Encounter (Signed)
He was scheduled today and ub[employer] did not pick him up just something else to stress him. ID nurse is working on placing patient from that hotel nothe the best living area.

## 2018-11-26 NOTE — Telephone Encounter (Signed)
Call from client that [facility] was no-show. CN will call [place] to advise. Lisette Abu RN BSN CNP [phone]

## 2018-11-26 NOTE — Telephone Encounter (Signed)
Call to client- ID x 2. CN advised appointment is moved to 4/30 at 1410. CN will drop taxi voucher at [employer]. Client continues to verbalize that he is "at the end of my rope". CN advised that she will meet client at appointment tomorrow to discuss alcohol and [facility] resources. Lisette Abu RN BSN CNP [phone]

## 2018-11-26 NOTE — Telephone Encounter (Signed)
Client identified x 2. Advised that [facility] ride is coming at 1330. Client will catch ride.

## 2018-11-26 NOTE — Telephone Encounter (Signed)
Call to let [facility] staff know that client transportation was no-show. Clien will be re-scheduled for 1410 4/30. Lisette Abu RN BSN CNP [phone]

## 2018-11-27 ENCOUNTER — Other Ambulatory Visit: Payer: Self-pay

## 2018-11-27 ENCOUNTER — Telehealth: Payer: Self-pay

## 2018-11-27 ENCOUNTER — Encounter: Payer: Self-pay | Admitting: Primary Care

## 2018-11-27 ENCOUNTER — Ambulatory Visit: Payer: Medicaid Other | Attending: Primary Care | Admitting: Primary Care

## 2018-11-27 VITALS — BP 132/81 | HR 98 | Temp 98.4°F | Resp 18 | Ht 65.0 in | Wt 144.0 lb

## 2018-11-27 DIAGNOSIS — K219 Gastro-esophageal reflux disease without esophagitis: Secondary | ICD-10-CM | POA: Diagnosis not present

## 2018-11-27 DIAGNOSIS — I252 Old myocardial infarction: Secondary | ICD-10-CM | POA: Insufficient documentation

## 2018-11-27 DIAGNOSIS — Z79899 Other long term (current) drug therapy: Secondary | ICD-10-CM | POA: Insufficient documentation

## 2018-11-27 DIAGNOSIS — Z91030 Bee allergy status: Secondary | ICD-10-CM | POA: Insufficient documentation

## 2018-11-27 DIAGNOSIS — Z5900 Homelessness unspecified: Secondary | ICD-10-CM

## 2018-11-27 DIAGNOSIS — Z7289 Other problems related to lifestyle: Secondary | ICD-10-CM | POA: Diagnosis not present

## 2018-11-27 DIAGNOSIS — Z8673 Personal history of transient ischemic attack (TIA), and cerebral infarction without residual deficits: Secondary | ICD-10-CM | POA: Insufficient documentation

## 2018-11-27 DIAGNOSIS — R441 Visual hallucinations: Secondary | ICD-10-CM | POA: Insufficient documentation

## 2018-11-27 DIAGNOSIS — J209 Acute bronchitis, unspecified: Secondary | ICD-10-CM | POA: Diagnosis not present

## 2018-11-27 DIAGNOSIS — J440 Chronic obstructive pulmonary disease with acute lower respiratory infection: Secondary | ICD-10-CM | POA: Diagnosis not present

## 2018-11-27 DIAGNOSIS — I10 Essential (primary) hypertension: Secondary | ICD-10-CM | POA: Insufficient documentation

## 2018-11-27 DIAGNOSIS — F17210 Nicotine dependence, cigarettes, uncomplicated: Secondary | ICD-10-CM | POA: Diagnosis not present

## 2018-11-27 DIAGNOSIS — Z825 Family history of asthma and other chronic lower respiratory diseases: Secondary | ICD-10-CM | POA: Insufficient documentation

## 2018-11-27 DIAGNOSIS — Z801 Family history of malignant neoplasm of trachea, bronchus and lung: Secondary | ICD-10-CM | POA: Diagnosis not present

## 2018-11-27 DIAGNOSIS — Z808 Family history of malignant neoplasm of other organs or systems: Secondary | ICD-10-CM | POA: Insufficient documentation

## 2018-11-27 DIAGNOSIS — F5101 Primary insomnia: Secondary | ICD-10-CM | POA: Insufficient documentation

## 2018-11-27 DIAGNOSIS — F332 Major depressive disorder, recurrent severe without psychotic features: Secondary | ICD-10-CM | POA: Insufficient documentation

## 2018-11-27 DIAGNOSIS — I2510 Atherosclerotic heart disease of native coronary artery without angina pectoris: Secondary | ICD-10-CM | POA: Insufficient documentation

## 2018-11-27 DIAGNOSIS — Z9114 Patient's other noncompliance with medication regimen: Secondary | ICD-10-CM | POA: Insufficient documentation

## 2018-11-27 DIAGNOSIS — Z881 Allergy status to other antibiotic agents status: Secondary | ICD-10-CM | POA: Insufficient documentation

## 2018-11-27 DIAGNOSIS — Z880 Allergy status to penicillin: Secondary | ICD-10-CM | POA: Insufficient documentation

## 2018-11-27 DIAGNOSIS — Z789 Other specified health status: Secondary | ICD-10-CM

## 2018-11-27 MED ORDER — ALBUTEROL SULFATE HFA 108 (90 BASE) MCG/ACT IN AERS: 2.0000 | INHALATION_SPRAY | RESPIRATORY_TRACT | 1 refills | Status: DC | PRN

## 2018-11-27 MED ORDER — PANTOPRAZOLE SODIUM 40 MG PO TBEC: 40.0000 mg | DELAYED_RELEASE_TABLET | Freq: Every day | ORAL | 1 refills | Status: DC

## 2018-11-27 MED ORDER — ALBUTEROL SULFATE HFA 108 (90 BASE) MCG/ACT IN AERS: 2.0000 | INHALATION_SPRAY | RESPIRATORY_TRACT | Status: DC | PRN

## 2018-11-27 MED FILL — traZODone HCL 100 MG TABS: 100 | 15 days supply | Qty: 30 | Fill #0

## 2018-11-27 MED FILL — SERTRALINE HCL 50 MG TABS: 50 | 30 days supply | Qty: 30 | Fill #0

## 2018-11-27 MED FILL — ALBUTEROL SULFATE HFA 108 (: 108 (90 BAS | 25 days supply | Qty: 9 | Fill #0

## 2018-11-27 MED FILL — PANTOPRAZOLE SOD DR 40 MG T: 40 | 30 days supply | Qty: 30 | Fill #0

## 2018-11-27 NOTE — Telephone Encounter (Signed)
Met with the patient when he was in the clinic today for his appointment with Juluis Mire, NP.  Cab transportation provided to and from the clinic. Patient currently staying at [facility] as he is homeless. He currently receives 3 meals/day provided by the [facility].  There is no plan for housing established when he needs to leave the motel.  He currently receives $531/month in disability.  Assisted him with completing a SCAT application  He explained to Ms Oletta Lamas that he has ongoing hallucinations about his wife and son who are both deceased.  He has not been following up with [facility] as recommended in the past but  he is  currently willing to seek treatment for his depression and ongoing alcohol abuse and he is agreeable to participating in an inpatient treatment program if available.   Ms Oletta Lamas contacted Buford Dresser, peer support with [facility] to inquire about peer support for the patient.  Ms Regenia Skeeter stated that her programs are in [place] and the patient would need to change his medicaid from [place] to [place] however that would not be the best option for the patient at this time.  She suggested a referral to [facility]. # [id number].  Then after checking with [facility], Ms Regenia Skeeter stated that the patient's needs go beyond the scope of a peer support specialist in regards to the ongoing alcohol use and hallucinations. She said that the patient would first need to be treated at an inpatient facility where peer support could meet with him and then develop a plan for support after inpatient treatment.  The patient is in agreement with this CM attempting  to identify possible inpatient treatment options.   He left the clinic with his filled prescriptions for : pantoprazole, trazadone, zoloft and albuterol inhater.
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
## 2018-11-28 ENCOUNTER — Telehealth: Payer: Self-pay

## 2018-11-28 NOTE — Telephone Encounter (Signed)
Call placed to [facility] # [phone], to inquire if they are accepting patients.  Spoke to Ailene Ravel who explained that they are taking patients but there is a wait list - currently 3 people. She said that they may have an opening for him next week so she put him on the wait list. She noted that he was last at their facility in 2019 and there was no reason why he could not come back. Informed her that he could be contacted at [facility], also provided her with the contact #s for this CM for her to notify this CM when bed available.    This CM spoke to Epimenio Sarin with [facility]. She she said that the patient had a homeless risk assessment done on 11/13/2018 and the results were given to the [facility].  She said that based on the results he qualifies for permanent supportive housing, She was not sure of the process for housing, if any is available , after the patients are released from the motels.

## 2018-12-01 ENCOUNTER — Telehealth: Payer: Self-pay

## 2018-12-01 NOTE — Telephone Encounter (Signed)
Completed SCAT application faxed to SCAT eligibility.  

## 2018-12-01 NOTE — Progress Notes (Signed)
Closing encounter per request. 

## 2018-12-02 NOTE — Progress Notes (Signed)
[place] Screening performed. Temperature, PHQ-9, and need for medical care and medications assessed. Patient states he has not heard from [facility] about lost ID card and insurance card. He also reports that he is in need of his depression medications. Referral made to [facility].  Arnold Long RN MSN

## 2018-12-03 ENCOUNTER — Telehealth: Payer: Self-pay

## 2018-12-03 NOTE — Telephone Encounter (Signed)
Message sent to Arnold Long, RN requesting confirmation of patient's current residence as he may have moved from [facility].  Call placed to [facility], spoke to [place] who stated that the patient is #2 on the wait list. There will possibly be an opening at the end of this week.  Provided her with the contact number for Lisette Abu, RN/CNP
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
## 2018-12-05 ENCOUNTER — Telehealth: Payer: Self-pay | Admitting: Pediatric Intensive Care

## 2018-12-05 ENCOUNTER — Telehealth: Payer: Self-pay

## 2018-12-05 ENCOUNTER — Other Ambulatory Visit: Payer: Self-pay | Admitting: Critical Care Medicine

## 2018-12-05 MED ORDER — PANTOPRAZOLE SODIUM 40 MG PO TBEC: 40.0000 mg | DELAYED_RELEASE_TABLET | Freq: Every day | ORAL | 1 refills | Status: DC

## 2018-12-05 MED ORDER — ALBUTEROL SULFATE HFA 108 (90 BASE) MCG/ACT IN AERS: 2.0000 | INHALATION_SPRAY | RESPIRATORY_TRACT | 1 refills | Status: DC | PRN

## 2018-12-05 MED ORDER — PRAZOSIN HCL 1 MG PO CAPS: 1.0000 mg | ORAL_CAPSULE | Freq: Every day | ORAL | 1 refills | Status: DC

## 2018-12-05 MED ORDER — SERTRALINE HCL 50 MG PO TABS: 50.0000 mg | ORAL_TABLET | Freq: Every day | ORAL | 1 refills | Status: DC

## 2018-12-05 MED ORDER — TRAZODONE HCL 50 MG PO TABS: 100.0000 mg | ORAL_TABLET | Freq: Every evening | ORAL | 1 refills | Status: DC | PRN

## 2018-12-05 MED FILL — SERTRALINE HCL 50 MG TABS: 50 | 30 days supply | Qty: 30 | Fill #0

## 2018-12-05 MED FILL — traZODone HCL 50 MG TABS: 50 | 30 days supply | Qty: 60 | Fill #0

## 2018-12-05 MED FILL — ALBUTEROL SULFATE HFA 108 (: 108 (90 BAS | 16 days supply | Qty: 9 | Fill #0

## 2018-12-05 MED FILL — PRAZOSIN 1 MG CAPSULE: 1 | 30 days supply | Qty: 30 | Fill #0

## 2018-12-05 MED FILL — PANTOPRAZOLE SOD DR 40 MG T: 40 | 30 days supply | Qty: 30 | Fill #0

## 2018-12-05 NOTE — Progress Notes (Signed)
Meds needed for Rehab stay.

## 2018-12-05 NOTE — Telephone Encounter (Signed)
Call to client- ID x 2. CN advised client that he has a slot at [facility] on Monday. CN clarified that client was ready to go to rehab. Client stated that Montine Circle at [facility] had told him he had "papers to sign for housing". CN advised client of importance of going to housing but that support for sobriety would be in his best interest at this point. Client agrees to go to [facility] on Monday. CN advised client that she would arrange transportation and 30 days of medication.  Lisette Abu RN BSN CNP [phone]

## 2018-12-05 NOTE — Telephone Encounter (Signed)
Call received from [facility], stating that Samuel Reyes will have a bed available for patient on Monday, 12/08/2018.  She spoke to the patient and he remains in agreement to go.  He will need 30 days of medication and refills available.   As per Samuel Reyes, [place] director, he is eligible for one time free fill of medications. This information was shared with Samuel Reyes.

## 2018-12-08 NOTE — Telephone Encounter (Signed)
Lisette Abu, RN/CN picked up prazosin from [facility] pharmacy today and will deliver to [facility].  She also took letter from this CM noting patient is experiencing homelessness in [place].   This CM also spoke to [facility], [facility] who stated that he is aware that patient is at [facility] and he will continue to work with him to identify permanent housing

## 2018-12-15 ENCOUNTER — Telehealth: Payer: Self-pay

## 2018-12-15 NOTE — Telephone Encounter (Signed)
Attempted to contact patient's counselor at [facility]  [phone] to obtain an update on his status and discharge plan.  Message left for Ms Nicole Kindred, requesting a call back to this CM # [phone]

## 2018-12-17 ENCOUNTER — Telehealth: Payer: Self-pay

## 2018-12-17 NOTE — Telephone Encounter (Signed)
Attempted to contact patient's counselor at [facility] [phone] to obtain an update on his status and discharge plan.  Message left for Ms Legrand Rams, requesting a call back to this CM # [phone]

## 2018-12-18 ENCOUNTER — Telehealth: Payer: Self-pay

## 2018-12-18 NOTE — Telephone Encounter (Signed)
Thank you :)

## 2018-12-18 NOTE — Telephone Encounter (Signed)
Call received from [facility]. She explained they have not addressed a discharge plan yet and do not usually address this topic until week 2.  He has been authorized to stay there through 01/06/2019 and there is a possibility that they may receive authorization for him to stay longer.  The After Care Coordinator , Emmit Alexanders will be working with him to establish post discharge plan, of which may include transition to long term residential facility if patient is in agreement.

## 2018-12-29 ENCOUNTER — Emergency Department (HOSPITAL_BASED_OUTPATIENT_CLINIC_OR_DEPARTMENT_OTHER)
Admission: EM | Admit: 2018-12-29 | Discharge: 2018-12-29 | Disposition: A | Discharge status: Home / Self Care | Payer: Medicaid Other | Attending: Emergency Medicine | Admitting: Emergency Medicine

## 2018-12-29 ENCOUNTER — Other Ambulatory Visit: Payer: Self-pay

## 2018-12-29 ENCOUNTER — Encounter (HOSPITAL_BASED_OUTPATIENT_CLINIC_OR_DEPARTMENT_OTHER): Payer: Self-pay | Admitting: *Deleted

## 2018-12-29 DIAGNOSIS — R21 Rash and other nonspecific skin eruption: Secondary | ICD-10-CM

## 2018-12-29 DIAGNOSIS — I2510 Atherosclerotic heart disease of native coronary artery without angina pectoris: Secondary | ICD-10-CM | POA: Diagnosis not present

## 2018-12-29 DIAGNOSIS — Z87891 Personal history of nicotine dependence: Secondary | ICD-10-CM | POA: Insufficient documentation

## 2018-12-29 DIAGNOSIS — Z8673 Personal history of transient ischemic attack (TIA), and cerebral infarction without residual deficits: Secondary | ICD-10-CM | POA: Insufficient documentation

## 2018-12-29 DIAGNOSIS — Z79899 Other long term (current) drug therapy: Secondary | ICD-10-CM | POA: Insufficient documentation

## 2018-12-29 DIAGNOSIS — J449 Chronic obstructive pulmonary disease, unspecified: Secondary | ICD-10-CM | POA: Insufficient documentation

## 2018-12-29 DIAGNOSIS — I10 Essential (primary) hypertension: Secondary | ICD-10-CM | POA: Insufficient documentation

## 2018-12-29 MED ORDER — PERMETHRIN 5 % EX CREA: TOPICAL_CREAM | CUTANEOUS | 0 refills | Status: DC
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
## 2018-12-29 NOTE — ED Provider Notes (Signed)
[facility] EMERGENCY DEPARTMENT Provider Note   CSN: [id number] Arrival date & time: 12/29/18  1007    History   Chief Complaint Chief Complaint  Patient presents with  . Rash    HPI Samuel Reyes is a 63 y.o. male.     HPI Patient with 3 weeks of itching rash to his bilateral ankles, bilateral arms, upper back and scalp.  No known bug bites.  No fever or chills.  Patient is currently staying in outpatient rehab facility. Past Medical History:  Diagnosis Date  . Alcoholism ([place])   . CAD (coronary artery disease) 2002   MI, no intervention required  . COPD (chronic obstructive pulmonary disease) (HCC)    not on home O2  . Depression   . GERD (gastroesophageal reflux disease)   . Headache   . Hypertension   . Stroke [facility]) 2006    Patient Active Problem List   Diagnosis Date Noted  . QT prolongation 09/09/2018  . Overdose 09/06/2018  . Substance induced mood disorder ([place]) 07/25/2018  . MDD (major depressive disorder), recurrent episode, severe ([place]) 07/18/2018  . MDD (major depressive disorder), recurrent severe, without psychosis ([place]) 08/26/2017  . Alcohol withdrawal ([place]) 08/08/2017  . COPD with acute bronchitis ([place]) 07/29/2017  . Alcohol use   . Homelessness 06/24/2017  . Lung nodule 09/03/2016  . Tobacco abuse 09/03/2016  . Insomnia 09/03/2016  . HCAP (healthcare-associated pneumonia) 08/22/2016  . Bronchiolitis 08/21/2016  . Malnutrition of moderate degree 08/04/2016  . Atrial tachycardia ([place]) 08/02/2016  . Impaired glucose tolerance 08/02/2016  . Alcohol-induced mood disorder ([place]) 12/12/2015  . Syncope 11/15/2015  . Chest pain 11/15/2015  . Nausea vomiting and diarrhea 11/15/2015  . Abdominal pain 11/15/2015  . Major depressive disorder, recurrent episode, moderate with anxious distress ([place]) 10/30/2015  . Alcohol use disorder, severe, dependence ([place]) 10/29/2015  . COPD exacerbation ([place]) 09/20/2015  . Acute respiratory failure with  hypoxia ([place]) 09/18/2015  . Suicidal ideation 09/17/2015  . Alcohol intoxication ([place]) 09/17/2015  . Depression 09/17/2015  . Benign essential HTN 09/17/2015  . Hypokalemia 09/17/2015  . Hyponatremia 09/17/2015  . Coffee ground emesis 09/17/2015  . COPD (chronic obstructive pulmonary disease) ([place]) 02/17/2013  . GERD (gastroesophageal reflux disease) 02/17/2013    Past Surgical History:  Procedure Laterality Date  . BACK SURGERY     3 cervical spine surgeries C4-C5 fused  . COLONOSCOPY N/A 01/04/2014   Procedure: COLONOSCOPY;  Surgeon: Danie Binder, MD;  Location[facility];  Service: Endoscopy;  Laterality: N/A;  1:45  . ESOPHAGOGASTRODUODENOSCOPY N/A 01/04/2014   Procedure: ESOPHAGOGASTRODUODENOSCOPY (EGD);  Surgeon: Danie Binder, MD;  Location: [facility];  Service: Endoscopy;  Laterality: N/A;  . FINGER SURGERY Left    2nd, 3rd, & 4th fingers were cut off by table saw and reattached  . GASTRECTOMY    . HERNIA REPAIR    . INCISIONAL HERNIA REPAIR N/A 01/20/2014   Procedure: LAPAROSCOPIC RECURRENT  INCISIONAL HERNIA with mesh;  Surgeon: Edward Jolly, MD;  Location: [facility] ORS;  Service: General;  Laterality: N/A;  . rt knee arthroscopic surgery    . SHOULDER SURGERY Bilateral    3 surgeries on on left, 2 surgeries on right         Home Medications    Prior to Admission medications   Medication Sig Start Date End Date Taking? Authorizing Provider  albuterol (VENTOLIN HFA) 108 (90 Base) MCG/ACT inhaler Inhale 2 puffs into the lungs every 4 (four)  hours as needed for wheezing or shortness of breath. 12/05/18  Yes Elsie Stain, MD  diphenhydrAMINE (BENADRYL) 25 MG tablet Take 25 mg by mouth every 6 (six) hours as needed.   Yes [provider]  ibuprofen (ADVIL) 400 MG tablet Take 400 mg by mouth every 6 (six) hours as needed.   Yes [provider]  nicotine (NICODERM CQ - DOSED IN MG/24 HOURS) 21 mg/24hr patch Place 21 mg onto the skin daily.   Yes  [provider]  pantoprazole (PROTONIX) 40 MG tablet Take 1 tablet (40 mg total) by mouth daily. For reflux 12/05/18  Yes Elsie Stain, MD  prazosin (MINIPRESS) 1 MG capsule Take 1 capsule (1 mg total) by mouth at bedtime. For nightmares 12/05/18  Yes Elsie Stain, MD  sertraline (ZOLOFT) 50 MG tablet Take 1 tablet (50 mg total) by mouth daily. For mood 12/05/18  Yes Elsie Stain, MD  traZODone (DESYREL) 50 MG tablet Take 2 tablets (100 mg total) by mouth at bedtime and may repeat dose one time if needed. For sleep 12/05/18  Yes Elsie Stain, MD  permethrin Nancy Fetter) 5 % cream Apply to entire body once 12/29/18   Julianne Rice, MD    Family History Family History  Problem Relation Age of Onset  . Cancer Father        bone  . Cancer Brother        lungs  . Stroke Maternal Grandmother   . Asthma Son        died at age 40 in his sleep   . Spina bifida Son        died at age 77   . Dementia Mother   . Colon cancer Neg Hx     Social History Social History   Tobacco Use  . Smoking status: Former Smoker    Packs/day: 1.00    Years: 52.00    Pack years: 52.00    Types: Cigarettes    Start date: 07/30/1966  . Smokeless tobacco: Never Used  . Tobacco comment: uses nicotine patch  Substance Use Topics  . Alcohol use: Not Currently    Alcohol/week: 3.0 - 4.0 standard drinks    Types: 3 - 4 Cans of beer per week    Comment: not drink for 8 weeks  . Drug use: No    Comment: denied using any drugs     Allergies   Bee venom; Penicillins; and Vancomycin   Review of Systems Review of Systems  Constitutional: Negative for chills and fever.  Musculoskeletal: Negative for arthralgias, joint swelling and myalgias.  Skin: Positive for rash.  All other systems reviewed and are negative.    Physical Exam Updated Vital Signs BP (!) 152/79 (BP Location: Right Arm)   Pulse 81   Temp 98.3 F (36.8 C) (Oral)   Resp 14   Ht 5\' 5"  (1.651 m)   Wt 68 kg   SpO2  100%   BMI 24.96 kg/m   Physical Exam Vitals signs and nursing note reviewed.  Constitutional:      Appearance: He is well-developed.  HENT:     Head: Normocephalic and atraumatic.     Mouth/Throat:     Comments: No intraoral lesions. Eyes:     Pupils: Pupils are equal, round, and reactive to light.  Neck:     Musculoskeletal: Normal range of motion and neck supple.  Cardiovascular:     Rate and Rhythm: Normal rate and regular rhythm.  Pulmonary:     Effort: Pulmonary effort is normal.     Breath sounds: Normal breath sounds.  Abdominal:     Palpations: Abdomen is soft.     Tenderness: There is no abdominal tenderness. There is no guarding or rebound.  Musculoskeletal: Normal range of motion.        General: No swelling, tenderness, deformity or signs of injury.     Right lower leg: No edema.     Left lower leg: No edema.  Skin:    General: Skin is warm and dry.     Findings: Rash present. No erythema.     Comments: Patient has scattered erythematous maculopapular rash to the bilateral ankles, bilateral forearms, thoracic back and scalp.  Multiple excoriation marks.  Neurological:     Mental Status: He is alert and oriented to person, place, and time.  Psychiatric:        Behavior: Behavior normal.      ED Treatments / Results  Labs (all labs ordered are listed, but only abnormal results are displayed) Labs Reviewed - No data to display  EKG None  Radiology No results found.  Procedures Procedures (including critical care time)  Medications Ordered in ED Medications - No data to display   Initial Impression / Assessment and Plan / ED Course  I have reviewed the triage vital signs and the nursing notes.  Pertinent labs & imaging results that were available during my care of the patient were reviewed by me and considered in my medical decision making (see chart for details).        Concern rash may be from insect or mite bites.  Advised to check  bedding for possible bedbugs.  Given scabies is a possibility will treat with Elimite cream.  Return precautions given.  Final Clinical Impressions(s) / ED Diagnoses   Final diagnoses:  Rash    ED Discharge Orders         Ordered    permethrin (ELIMITE) 5 % cream     12/29/18 1029           Julianne Rice, MD 12/29/18 1036

## 2018-12-31 ENCOUNTER — Emergency Department (HOSPITAL_BASED_OUTPATIENT_CLINIC_OR_DEPARTMENT_OTHER): Payer: Medicaid Other

## 2018-12-31 ENCOUNTER — Other Ambulatory Visit: Payer: Self-pay

## 2018-12-31 ENCOUNTER — Encounter (HOSPITAL_BASED_OUTPATIENT_CLINIC_OR_DEPARTMENT_OTHER): Payer: Self-pay | Admitting: Emergency Medicine

## 2018-12-31 ENCOUNTER — Emergency Department (HOSPITAL_BASED_OUTPATIENT_CLINIC_OR_DEPARTMENT_OTHER)
Admission: EM | Admit: 2018-12-31 | Discharge: 2018-12-31 | Disposition: A | Discharge status: Home / Self Care | Payer: Medicaid Other | Attending: Emergency Medicine | Admitting: Emergency Medicine

## 2018-12-31 DIAGNOSIS — Z79899 Other long term (current) drug therapy: Secondary | ICD-10-CM | POA: Diagnosis not present

## 2018-12-31 DIAGNOSIS — I2510 Atherosclerotic heart disease of native coronary artery without angina pectoris: Secondary | ICD-10-CM | POA: Insufficient documentation

## 2018-12-31 DIAGNOSIS — I10 Essential (primary) hypertension: Secondary | ICD-10-CM | POA: Insufficient documentation

## 2018-12-31 DIAGNOSIS — Z87891 Personal history of nicotine dependence: Secondary | ICD-10-CM | POA: Diagnosis not present

## 2018-12-31 DIAGNOSIS — R0789 Other chest pain: Secondary | ICD-10-CM | POA: Insufficient documentation

## 2018-12-31 DIAGNOSIS — R21 Rash and other nonspecific skin eruption: Secondary | ICD-10-CM | POA: Diagnosis not present

## 2018-12-31 LAB — URINALYSIS, ROUTINE W REFLEX MICROSCOPIC
Bilirubin Urine: NEGATIVE
Glucose, UA: NEGATIVE mg/dL
Hgb urine dipstick: NEGATIVE
Ketones, ur: NEGATIVE mg/dL
Leukocytes,Ua: NEGATIVE
Nitrite: NEGATIVE
Protein, ur: NEGATIVE mg/dL
Specific Gravity, Urine: 1.02 (ref 1.005–1.030)
pH: 6 (ref 5.0–8.0)

## 2018-12-31 MED ORDER — PREDNISONE 20 MG PO TABS: 20.0000 mg | ORAL_TABLET | Freq: Every day | ORAL | 0 refills | Status: DC

## 2018-12-31 NOTE — ED Triage Notes (Signed)
Pt states he is still itching.  He believes it is getting worse.  Pt seen by me on Monday and I cannot see any changes in rash.

## 2018-12-31 NOTE — Discharge Instructions (Addendum)
The rash could come from insect bites or other irritation.  Follow-up with PCP as needed and the steroid should help with the itching.

## 2018-12-31 NOTE — ED Provider Notes (Signed)
[facility] EMERGENCY DEPARTMENT Provider Note   CSN: [id number] Arrival date & time: 12/31/18  4696    History   Chief Complaint Chief Complaint  Patient presents with  . Rash    recheck    HPI Samuel Reyes is a 63 y.o. male.     HPI Patient presents from [facility].  Has a rash on his extremities back and chest.  States it itches.  Has had for around 3 weeks.  Seen in the ER 2 days ago and given treatment.  No known bites.  States that her rashes may be potentially gotten worse.  No fevers.  States the Benadryl does not help.  No one else or has a rash.  Also states that he has a right flank pain.  Points on the right ribs.  He said that since he started [facility] also.  No swelling his legs.  No fevers.  No cough.  No trauma.  No dysuria. Past Medical History:  Diagnosis Date  . Alcoholism ([place])   . CAD (coronary artery disease) 2002   MI, no intervention required  . COPD (chronic obstructive pulmonary disease) (HCC)    not on home O2  . Depression   . GERD (gastroesophageal reflux disease)   . Headache   . Hypertension   . Stroke [facility]) 2006    Patient Active Problem List   Diagnosis Date Noted  . QT prolongation 09/09/2018  . Overdose 09/06/2018  . Substance induced mood disorder ([place]) 07/25/2018  . MDD (major depressive disorder), recurrent episode, severe ([place]) 07/18/2018  . MDD (major depressive disorder), recurrent severe, without psychosis ([place]) 08/26/2017  . Alcohol withdrawal ([place]) 08/08/2017  . COPD with acute bronchitis ([place]) 07/29/2017  . Alcohol use   . Homelessness 06/24/2017  . Lung nodule 09/03/2016  . Tobacco abuse 09/03/2016  . Insomnia 09/03/2016  . HCAP (healthcare-associated pneumonia) 08/22/2016  . Bronchiolitis 08/21/2016  . Malnutrition of moderate degree 08/04/2016  . Atrial tachycardia ([place]) 08/02/2016  . Impaired glucose tolerance 08/02/2016  . Alcohol-induced mood disorder ([place]) 12/12/2015  . Syncope 11/15/2015  . Chest  pain 11/15/2015  . Nausea vomiting and diarrhea 11/15/2015  . Abdominal pain 11/15/2015  . Major depressive disorder, recurrent episode, moderate with anxious distress ([place]) 10/30/2015  . Alcohol use disorder, severe, dependence ([place]) 10/29/2015  . COPD exacerbation ([place]) 09/20/2015  . Acute respiratory failure with hypoxia ([place]) 09/18/2015  . Suicidal ideation 09/17/2015  . Alcohol intoxication ([place]) 09/17/2015  . Depression 09/17/2015  . Benign essential HTN 09/17/2015  . Hypokalemia 09/17/2015  . Hyponatremia 09/17/2015  . Coffee ground emesis 09/17/2015  . COPD (chronic obstructive pulmonary disease) ([place]) 02/17/2013  . GERD (gastroesophageal reflux disease) 02/17/2013    Past Surgical History:  Procedure Laterality Date  . BACK SURGERY     3 cervical spine surgeries C4-C5 fused  . COLONOSCOPY N/A 01/04/2014   Procedure: COLONOSCOPY;  Surgeon: Danie Binder, MD;  [facility];  Service: Endoscopy;  Laterality: N/A;  1:45  . ESOPHAGOGASTRODUODENOSCOPY N/A 01/04/2014   Procedure: ESOPHAGOGASTRODUODENOSCOPY (EGD);  Surgeon: Danie Binder, MD;  Location[facility];  Service: Endoscopy;  Laterality: N/A;  . FINGER SURGERY Left    2nd, 3rd, & 4th fingers were cut off by table saw and reattached  . GASTRECTOMY    . HERNIA REPAIR    . INCISIONAL HERNIA REPAIR N/A 01/20/2014   Procedure: LAPAROSCOPIC RECURRENT  INCISIONAL HERNIA with mesh;  Surgeon: Edward Jolly, MD;  Location: [facility];  Service: General;  Laterality: N/A;  . rt knee arthroscopic surgery    . SHOULDER SURGERY Bilateral    3 surgeries on on left, 2 surgeries on right         Home Medications    Prior to Admission medications   Medication Sig Start Date End Date Taking? Authorizing Provider  albuterol (VENTOLIN HFA) 108 (90 Base) MCG/ACT inhaler Inhale 2 puffs into the lungs every 4 (four) hours as needed for wheezing or shortness of breath. 12/05/18   Elsie Stain, MD  diphenhydrAMINE  (BENADRYL) 25 MG tablet Take 25 mg by mouth every 6 (six) hours as needed.    [provider]  ibuprofen (ADVIL) 400 MG tablet Take 400 mg by mouth every 6 (six) hours as needed.    [provider]  nicotine (NICODERM CQ - DOSED IN MG/24 HOURS) 21 mg/24hr patch Place 21 mg onto the skin daily.    [provider]  pantoprazole (PROTONIX) 40 MG tablet Take 1 tablet (40 mg total) by mouth daily. For reflux 12/05/18   Elsie Stain, MD  permethrin Nancy Fetter) 5 % cream Apply to entire body once 12/29/18   Julianne Rice, MD  prazosin (MINIPRESS) 1 MG capsule Take 1 capsule (1 mg total) by mouth at bedtime. For nightmares 12/05/18   Elsie Stain, MD  predniSONE (DELTASONE) 20 MG tablet Take 1 tablet (20 mg total) by mouth daily. 12/31/18   Davonna Belling, MD  sertraline (ZOLOFT) 50 MG tablet Take 1 tablet (50 mg total) by mouth daily. For mood 12/05/18   Elsie Stain, MD  traZODone (DESYREL) 50 MG tablet Take 2 tablets (100 mg total) by mouth at bedtime and may repeat dose one time if needed. For sleep 12/05/18   Elsie Stain, MD    Family History Family History  Problem Relation Age of Onset  . Cancer Father        bone  . Cancer Brother        lungs  . Stroke Maternal Grandmother   . Asthma Son        died at age 63 in his sleep   . Spina bifida Son        died at age 37   . Dementia Mother   . Colon cancer Neg Hx     Social History Social History   Tobacco Use  . Smoking status: Former Smoker    Packs/day: 1.00    Years: 52.00    Pack years: 52.00    Types: Cigarettes    Start date: 07/30/1966  . Smokeless tobacco: Never Used  . Tobacco comment: uses nicotine patch  Substance Use Topics  . Alcohol use: Not Currently    Alcohol/week: 3.0 - 4.0 standard drinks    Types: 3 - 4 Cans of beer per week    Comment: not drink for 8 weeks  . Drug use: No    Comment: denied using any drugs     Allergies   Bee venom; Penicillins; and Vancomycin    Review of Systems Review of Systems  Constitutional: Negative for appetite change.  HENT: Negative for congestion.   Respiratory: Negative for shortness of breath.   Cardiovascular: Positive for chest pain.  Gastrointestinal: Negative for abdominal pain.  Endocrine: Negative for polyuria.  Genitourinary: Negative for flank pain.  Musculoskeletal: Negative for arthralgias.  Neurological: Negative for weakness.  Psychiatric/Behavioral: Negative for confusion.     Physical Exam Updated Vital Signs BP  131/78 (BP Location: Right Arm)   Pulse 92   Temp 97.7 F (36.5 C) (Oral)   Resp 12   SpO2 98%   Physical Exam Vitals signs and nursing note reviewed.  HENT:     Head: Normocephalic.  Neck:     Musculoskeletal: Neck supple.  Cardiovascular:     Rate and Rhythm: Regular rhythm.  Pulmonary:     Comments: Tenderness over right lateral inferior ribs.  No abdominal tenderness.  No crepitance or deformity. Chest:     Chest wall: Tenderness present.  Abdominal:     Tenderness: There is no abdominal tenderness.  Skin:    Comments: There is a papular rash with lesions on chest extremities and legs.  Overall few lesions with some excoriation.  Neurological:     General: No focal deficit present.     Mental Status: He is alert.      ED Treatments / Results  Labs (all labs ordered are listed, but only abnormal results are displayed) Labs Reviewed  URINALYSIS, ROUTINE W REFLEX MICROSCOPIC    EKG None  Radiology Dg Ribs Unilateral W/chest Right  Result Date: 12/31/2018 CLINICAL DATA:  Right rib pain for 3 weeks without known injury. EXAM: RIGHT RIBS AND CHEST - 3+ VIEW COMPARISON:  Radiographs of November 18, 2018. FINDINGS: Mildly displaced fractures are seen involving the anterior portions of the right eighth and ninth ribs with some callus formation suggesting subacute to old fractures. There is no evidence of pneumothorax or pleural effusion. Both lungs are clear. Heart size  and mediastinal contours are within normal limits. IMPRESSION: Probable subacute to old fractures are seen involving the anterior portions of the right eighth and ninth ribs. No acute cardiopulmonary abnormality seen. Electronically Signed   By: Marijo Conception M.D.   On: 12/31/2018 10:56    Procedures Procedures (including critical care time)  Medications Ordered in ED Medications - No data to display   Initial Impression / Assessment and Plan / ED Course  I have reviewed the triage vital signs and the nursing notes.  Pertinent labs & imaging results that were available during my care of the patient were reviewed by me and considered in my medical decision making (see chart for details).        Patient with rash.  Recently seen for same given some medications.  Continued itchiness.  Will give some steroids.  Could be insect bites or potentially something allergic or contact related. Also right-sided chest pain.  X-ray showed potentially old fractures.  States he has broken ribs on the side before but potentially also could be subacute.  Over-the-counter medications as needed for pain control.  Discharge home with PCP follow-up as needed.  Final Clinical Impressions(s) / ED Diagnoses   Final diagnoses:  Rash  Chest wall pain    ED Discharge Orders         Ordered    predniSONE (DELTASONE) 20 MG tablet  Daily     12/31/18 1130           Davonna Belling, MD 12/31/18 1134

## 2019-01-05 ENCOUNTER — Encounter: Payer: Self-pay | Admitting: Gastroenterology

## 2019-01-13 ENCOUNTER — Telehealth: Payer: Self-pay

## 2019-01-13 NOTE — Telephone Encounter (Signed)
Call placed to [facility], spoke to June who stated that the patient is getting ready for discharge and will be transitioning to supportive housing.

## 2019-01-14 ENCOUNTER — Telehealth: Payer: Self-pay

## 2019-01-14 ENCOUNTER — Other Ambulatory Visit: Payer: Self-pay

## 2019-01-14 ENCOUNTER — Ambulatory Visit: Payer: Medicaid Other | Attending: Nurse Practitioner | Admitting: Nurse Practitioner

## 2019-01-14 DIAGNOSIS — Z0000 Encounter for general adult medical examination without abnormal findings: Secondary | ICD-10-CM

## 2019-01-14 MED FILL — PANTOPRAZOLE SOD DR 40 MG T: 40 | 30 days supply | Qty: 30 | Fill #1

## 2019-01-14 MED FILL — SERTRALINE HCL 50 MG TABS: 50 | 30 days supply | Qty: 30 | Fill #1

## 2019-01-14 NOTE — Progress Notes (Signed)
Patient injected with PPD test.  Patient tolerated well.  Patient told to return in two days.  Patient acknowledged understanding of advice.

## 2019-01-14 NOTE — Telephone Encounter (Signed)
Met with the patient when he was in the clinic today.  He explained that he is waiting to move into supportive housing.  He was not sure where he was moving to.    He has a telephone encounter with his PCP tomorrow and he is requesting nicotine patches. He said that he ordered some and is currently using them but will need a prescription going forward.  He explained that he has not had any alcohol and needs this to help with smoking cessation. Informed him that his PCP will be notified. He also said that he may need a cortisone injection for knee pain.   This CM spoke to Michelle/ caseworker from [facility] that transported him to [facility] today. She said that the plan is for him to go to [facility] in [place] for 65 days and then transfer to [facility] in [place] for permanent supportive housing.

## 2019-01-15 ENCOUNTER — Other Ambulatory Visit: Payer: Self-pay | Admitting: Internal Medicine

## 2019-01-15 ENCOUNTER — Telehealth: Payer: Self-pay | Admitting: Primary Care

## 2019-01-15 ENCOUNTER — Ambulatory Visit: Payer: Medicaid Other | Admitting: Primary Care

## 2019-01-15 LAB — HIV ANTIBODY (ROUTINE TESTING W REFLEX): HIV Screen 4th Generation wRfx: NONREACTIVE

## 2019-01-15 MED ORDER — SERTRALINE HCL 50 MG PO TABS: 50.0000 mg | ORAL_TABLET | Freq: Every day | ORAL | 1 refills | Status: DC

## 2019-01-15 MED ORDER — ALBUTEROL SULFATE HFA 108 (90 BASE) MCG/ACT IN AERS: 2.0000 | INHALATION_SPRAY | RESPIRATORY_TRACT | 1 refills | Status: DC | PRN

## 2019-01-15 MED ORDER — PRAZOSIN HCL 1 MG PO CAPS: 1.0000 mg | ORAL_CAPSULE | Freq: Every day | ORAL | 1 refills | Status: DC

## 2019-01-15 MED ORDER — TRAZODONE HCL 50 MG PO TABS: 100.0000 mg | ORAL_TABLET | Freq: Every evening | ORAL | 1 refills | Status: DC | PRN

## 2019-01-15 MED ORDER — PANTOPRAZOLE SODIUM 40 MG PO TBEC: 40.0000 mg | DELAYED_RELEASE_TABLET | Freq: Every day | ORAL | 1 refills | Status: DC

## 2019-01-15 MED ORDER — NICOTINE 21 MG/24HR TD PT24: 21.0000 mg | MEDICATED_PATCH | Freq: Every day | TRANSDERMAL | 0 refills | Status: DC

## 2019-01-15 NOTE — Telephone Encounter (Signed)
Would like to know if they can have their results given to their on site nurse at [facility] location. Please follow up.

## 2019-01-15 NOTE — Telephone Encounter (Signed)
Nurse called the patient's home phone number but received no answer and message was left on the voicemail for the patient to call back.  Return phone number given. 

## 2019-01-15 NOTE — Telephone Encounter (Addendum)
Call placed to [facility], spoke to [facility] who transported patient to [facility] yesterday.   Informed her that the patient needs to come back to the clinic tomorrow  - 01/16/2019 @ 1530 to have the PPD read. As per our medical director, the reading needs to be done in the clinic not by the nurse at [facility].  Melissa stated that she understood.   Melissa said that the patient needs to pick up refills for all medications except pantoprazole and sertraline. He picked up those 2 yesterday. She will make sure that he gets those refills when he is at the clinic tomorrow. He will also need refills for all of his medications as he will be out of the area at another facility. He is also requesting a prescription for nicotine patches.    In addition, he will need a copy of his PPD results and HIV test results.  The plan is to transfer him to the next facility on 01/19/2019 or 01/20/2019.

## 2019-01-16 ENCOUNTER — Other Ambulatory Visit: Payer: Self-pay

## 2019-01-16 ENCOUNTER — Ambulatory Visit: Payer: Medicaid Other | Attending: Primary Care | Admitting: Emergency Medicine

## 2019-01-16 ENCOUNTER — Ambulatory Visit: Payer: Medicaid Other | Admitting: Internal Medicine

## 2019-01-16 VITALS — HR 89 | Temp 98.3°F | Resp 18 | Ht 65.0 in | Wt 137.0 lb

## 2019-01-16 DIAGNOSIS — Z0000 Encounter for general adult medical examination without abnormal findings: Secondary | ICD-10-CM

## 2019-01-16 LAB — TB SKIN TEST
Induration: 0 mm
TB Skin Test: NEGATIVE

## 2019-01-16 MED FILL — ALBUTEROL SULFATE HFA 108 (: 108 (90 BAS | 15 days supply | Qty: 9 | Fill #0

## 2019-01-16 MED FILL — NICOTINE 21 MG/24HR PATCH: 21 | 28 days supply | Qty: 28 | Fill #0

## 2019-01-16 NOTE — Progress Notes (Signed)
Patient presents to clinic to have PPD exam read.  Test was negative.  Patient does not need a chest x-ray.

## 2019-01-16 NOTE — Telephone Encounter (Signed)
Thank you :)

## 2019-01-19 ENCOUNTER — Telehealth: Payer: Self-pay

## 2019-01-19 NOTE — Telephone Encounter (Signed)
Call placed to [facility], left message for [facility] Coordinator to inquire if patient needs anything else for transfer to next facility in [place]. Message left with CM call back number if assistance is needed

## 2019-01-20 ENCOUNTER — Telehealth: Payer: Self-pay | Admitting: Primary Care

## 2019-01-20 NOTE — Telephone Encounter (Signed)
Call received from [facility].. she wanted to confirm that patient has all of his medications as he is being transferred to supportive housing in [place] tomorrow morning at [facility]. He has the albuterol inhaler, sertraline, nicotine patches and protonix. He needs the prazosin and trazodone.   Informed her that as per Georgiann Mohs, [place], the patient did not pick up  those medications last week when he picked up other medications. Requested that the prazosin and trazodone be filled as Jaynee Eagles will pick them up tomorrow morning.   Informed Sharyn Lull that the medications will be ready for pick up tomorrow.

## 2019-01-20 NOTE — Telephone Encounter (Addendum)
Call returned to [facility], message left requesting call back to this CM

## 2019-01-20 NOTE — Telephone Encounter (Signed)
Michelle(Aftercare coordinator)from [facility] called, would like a call back to discuss patients medications, please follow up

## 2019-01-21 ENCOUNTER — Telehealth: Payer: Self-pay

## 2019-01-21 MED FILL — traZODone HCL 50 MG TABS: 50 | 30 days supply | Qty: 60 | Fill #0

## 2019-01-21 MED FILL — PRAZOSIN 1 MG CAPSULE: 1 | 30 days supply | Qty: 30 | Fill #0

## 2019-01-21 NOTE — Telephone Encounter (Signed)
Call placed to [facility].  She said that she picked up patient's medications this morning and he has been transferred to [facility] in [place] for 65 days. She said that she provided the staff at that facility with this CM's name and contact information. After Dossie Arbour will return to [place] for another supportive residence.

## 2019-02-17 MED FILL — traZODone HCL 50 MG TABS: 50 | 30 days supply | Qty: 60 | Fill #1

## 2019-02-17 MED FILL — PANTOPRAZOLE SOD DR 40 MG T: 40 | 30 days supply | Qty: 30 | Fill #0

## 2019-02-17 MED FILL — SERTRALINE HCL 50 MG TABLET: 50 | 30 days supply | Qty: 30 | Fill #0

## 2019-03-13 ENCOUNTER — Inpatient Hospital Stay (HOSPITAL_COMMUNITY)
Admission: EM | Admit: 2019-03-13 | Discharge: 2019-03-19 | DRG: 641 | Disposition: A | Discharge status: Home / Self Care | Payer: Medicaid Other | Attending: Internal Medicine | Admitting: Internal Medicine

## 2019-03-13 ENCOUNTER — Emergency Department (HOSPITAL_COMMUNITY): Payer: Medicaid Other

## 2019-03-13 ENCOUNTER — Encounter (HOSPITAL_COMMUNITY): Payer: Self-pay | Admitting: Emergency Medicine

## 2019-03-13 DIAGNOSIS — Z91030 Bee allergy status: Secondary | ICD-10-CM

## 2019-03-13 DIAGNOSIS — Z880 Allergy status to penicillin: Secondary | ICD-10-CM

## 2019-03-13 DIAGNOSIS — E871 Hypo-osmolality and hyponatremia: Secondary | ICD-10-CM | POA: Diagnosis not present

## 2019-03-13 DIAGNOSIS — F10129 Alcohol abuse with intoxication, unspecified: Secondary | ICD-10-CM | POA: Diagnosis not present

## 2019-03-13 DIAGNOSIS — F329 Major depressive disorder, single episode, unspecified: Secondary | ICD-10-CM | POA: Diagnosis present

## 2019-03-13 DIAGNOSIS — F10229 Alcohol dependence with intoxication, unspecified: Secondary | ICD-10-CM | POA: Diagnosis present

## 2019-03-13 DIAGNOSIS — Z8659 Personal history of other mental and behavioral disorders: Secondary | ICD-10-CM | POA: Diagnosis not present

## 2019-03-13 DIAGNOSIS — J449 Chronic obstructive pulmonary disease, unspecified: Secondary | ICD-10-CM | POA: Diagnosis present

## 2019-03-13 DIAGNOSIS — R05 Cough: Secondary | ICD-10-CM | POA: Diagnosis not present

## 2019-03-13 DIAGNOSIS — E878 Other disorders of electrolyte and fluid balance, not elsewhere classified: Secondary | ICD-10-CM | POA: Diagnosis present

## 2019-03-13 DIAGNOSIS — Z7952 Long term (current) use of systemic steroids: Secondary | ICD-10-CM | POA: Diagnosis not present

## 2019-03-13 DIAGNOSIS — Y9259 Other trade areas as the place of occurrence of the external cause: Secondary | ICD-10-CM | POA: Diagnosis not present

## 2019-03-13 DIAGNOSIS — Z832 Family history of diseases of the blood and blood-forming organs and certain disorders involving the immune mechanism: Secondary | ICD-10-CM | POA: Diagnosis not present

## 2019-03-13 DIAGNOSIS — F1010 Alcohol abuse, uncomplicated: Secondary | ICD-10-CM | POA: Diagnosis not present

## 2019-03-13 DIAGNOSIS — K219 Gastro-esophageal reflux disease without esophagitis: Secondary | ICD-10-CM | POA: Diagnosis not present

## 2019-03-13 DIAGNOSIS — K210 Gastro-esophageal reflux disease with esophagitis: Secondary | ICD-10-CM | POA: Diagnosis present

## 2019-03-13 DIAGNOSIS — Z590 Homelessness: Secondary | ICD-10-CM | POA: Diagnosis not present

## 2019-03-13 DIAGNOSIS — Z79899 Other long term (current) drug therapy: Secondary | ICD-10-CM | POA: Diagnosis not present

## 2019-03-13 DIAGNOSIS — Z20828 Contact with and (suspected) exposure to other viral communicable diseases: Secondary | ICD-10-CM | POA: Diagnosis present

## 2019-03-13 DIAGNOSIS — R0789 Other chest pain: Secondary | ICD-10-CM | POA: Diagnosis present

## 2019-03-13 DIAGNOSIS — F1020 Alcohol dependence, uncomplicated: Secondary | ICD-10-CM | POA: Diagnosis not present

## 2019-03-13 DIAGNOSIS — I252 Old myocardial infarction: Secondary | ICD-10-CM

## 2019-03-13 DIAGNOSIS — S0990XA Unspecified injury of head, initial encounter: Secondary | ICD-10-CM | POA: Diagnosis present

## 2019-03-13 DIAGNOSIS — G4700 Insomnia, unspecified: Secondary | ICD-10-CM | POA: Diagnosis present

## 2019-03-13 DIAGNOSIS — Z87891 Personal history of nicotine dependence: Secondary | ICD-10-CM | POA: Diagnosis not present

## 2019-03-13 DIAGNOSIS — W010XXA Fall on same level from slipping, tripping and stumbling without subsequent striking against object, initial encounter: Secondary | ICD-10-CM | POA: Diagnosis present

## 2019-03-13 DIAGNOSIS — Z8673 Personal history of transient ischemic attack (TIA), and cerebral infarction without residual deficits: Secondary | ICD-10-CM

## 2019-03-13 DIAGNOSIS — E876 Hypokalemia: Secondary | ICD-10-CM | POA: Diagnosis present

## 2019-03-13 DIAGNOSIS — F17200 Nicotine dependence, unspecified, uncomplicated: Secondary | ICD-10-CM | POA: Diagnosis not present

## 2019-03-13 DIAGNOSIS — Z888 Allergy status to other drugs, medicaments and biological substances status: Secondary | ICD-10-CM

## 2019-03-13 DIAGNOSIS — I10 Essential (primary) hypertension: Secondary | ICD-10-CM | POA: Diagnosis present

## 2019-03-13 DIAGNOSIS — F10230 Alcohol dependence with withdrawal, uncomplicated: Secondary | ICD-10-CM | POA: Diagnosis not present

## 2019-03-13 DIAGNOSIS — I2510 Atherosclerotic heart disease of native coronary artery without angina pectoris: Secondary | ICD-10-CM | POA: Diagnosis present

## 2019-03-13 DIAGNOSIS — R059 Cough, unspecified: Secondary | ICD-10-CM

## 2019-03-13 HISTORY — DX: Alcohol dependence with withdrawal delirium: F10.231

## 2019-03-13 HISTORY — DX: Barrett's esophagus without dysplasia: K22.70

## 2019-03-13 HISTORY — DX: Alcohol use, unspecified with withdrawal delirium: F10.931

## 2019-03-13 LAB — PROTIME-INR
INR: 0.9 (ref 0.8–1.2)
Prothrombin Time: 12.5 seconds (ref 11.4–15.2)

## 2019-03-13 LAB — CBC WITH DIFFERENTIAL/PLATELET
Abs Immature Granulocytes: 0.22 10*3/uL — ABNORMAL HIGH (ref 0.00–0.07)
Basophils Absolute: 0 10*3/uL (ref 0.0–0.1)
Basophils Relative: 0 %
Eosinophils Absolute: 0 10*3/uL (ref 0.0–0.5)
Eosinophils Relative: 0 %
HCT: 37 % — ABNORMAL LOW (ref 39.0–52.0)
Hemoglobin: 13 g/dL (ref 13.0–17.0)
Immature Granulocytes: 1 %
Lymphocytes Relative: 7 %
Lymphs Abs: 1.2 10*3/uL (ref 0.7–4.0)
MCH: 27.1 pg (ref 26.0–34.0)
MCHC: 35.1 g/dL (ref 30.0–36.0)
MCV: 77.2 fL — ABNORMAL LOW (ref 80.0–100.0)
Monocytes Absolute: 2.1 10*3/uL — ABNORMAL HIGH (ref 0.1–1.0)
Monocytes Relative: 13 %
Neutro Abs: 13 10*3/uL — ABNORMAL HIGH (ref 1.7–7.7)
Neutrophils Relative %: 79 %
Platelets: 253 10*3/uL (ref 150–400)
RBC: 4.79 MIL/uL (ref 4.22–5.81)
RDW: 18.2 % — ABNORMAL HIGH (ref 11.5–15.5)
WBC: 16.6 10*3/uL — ABNORMAL HIGH (ref 4.0–10.5)
nRBC: 0 % (ref 0.0–0.2)

## 2019-03-13 LAB — TYPE AND SCREEN
ABO/RH(D): O POS
Antibody Screen: NEGATIVE

## 2019-03-13 LAB — CBC
HCT: 34.2 % — ABNORMAL LOW (ref 39.0–52.0)
Hemoglobin: 12.4 g/dL — ABNORMAL LOW (ref 13.0–17.0)
MCH: 27.6 pg (ref 26.0–34.0)
MCHC: 36.3 g/dL — ABNORMAL HIGH (ref 30.0–36.0)
MCV: 76 fL — ABNORMAL LOW (ref 80.0–100.0)
Platelets: 210 10*3/uL (ref 150–400)
RBC: 4.5 MIL/uL (ref 4.22–5.81)
RDW: 18.1 % — ABNORMAL HIGH (ref 11.5–15.5)
WBC: 16 10*3/uL — ABNORMAL HIGH (ref 4.0–10.5)
nRBC: 0 % (ref 0.0–0.2)

## 2019-03-13 LAB — RAPID URINE DRUG SCREEN, HOSP PERFORMED
Amphetamines: NOT DETECTED
Barbiturates: NOT DETECTED
Benzodiazepines: NOT DETECTED
Cocaine: NOT DETECTED
Opiates: NOT DETECTED
Tetrahydrocannabinol: NOT DETECTED

## 2019-03-13 LAB — BASIC METABOLIC PANEL
Anion gap: 17 — ABNORMAL HIGH (ref 5–15)
Anion gap: 18 — ABNORMAL HIGH (ref 5–15)
BUN: 7 mg/dL — ABNORMAL LOW (ref 8–23)
BUN: 6 mg/dL — ABNORMAL LOW (ref 8–23)
CO2: 27 mmol/L (ref 22–32)
CO2: 26 mmol/L (ref 22–32)
Calcium: 7.4 mg/dL — ABNORMAL LOW (ref 8.9–10.3)
Calcium: 7.3 mg/dL — ABNORMAL LOW (ref 8.9–10.3)
Chloride: 68 mmol/L — ABNORMAL LOW (ref 98–111)
Chloride: 68 mmol/L — ABNORMAL LOW (ref 98–111)
Creatinine, Ser: 0.62 mg/dL (ref 0.61–1.24)
Creatinine, Ser: 0.61 mg/dL (ref 0.61–1.24)
GFR calc Af Amer: >60 mL/min (ref >60)
GFR calc Af Amer: >60 mL/min (ref >60)
GFR calc non Af Amer: >60 mL/min (ref >60)
GFR calc non Af Amer: >60 mL/min (ref >60)
Glucose, Bld: 90 mg/dL (ref 70–99)
Glucose, Bld: 78 mg/dL (ref 70–99)
Potassium: 3 mmol/L — ABNORMAL LOW (ref 3.5–5.1)
Potassium: 3 mmol/L — ABNORMAL LOW (ref 3.5–5.1)
Sodium: 112 mmol/L — CL (ref 135–145)
Sodium: 112 mmol/L — CL (ref 135–145)

## 2019-03-13 LAB — TROPONIN I (HIGH SENSITIVITY)
Troponin I (High Sensitivity): 10 ng/L (ref <18)
Troponin I (High Sensitivity): 10 ng/L (ref <18)

## 2019-03-13 LAB — COMPREHENSIVE METABOLIC PANEL
ALT: 17 U/L (ref 0–44)
AST: 35 U/L (ref 15–41)
Albumin: 3.4 g/dL — ABNORMAL LOW (ref 3.5–5.0)
Alkaline Phosphatase: 89 U/L (ref 38–126)
BUN: 7 mg/dL — ABNORMAL LOW (ref 8–23)
CO2: 27 mmol/L (ref 22–32)
Calcium: 7.7 mg/dL — ABNORMAL LOW (ref 8.9–10.3)
Chloride: <65 mmol/L — CL (ref 98–111)
Creatinine, Ser: 0.63 mg/dL (ref 0.61–1.24)
GFR calc Af Amer: >60 mL/min (ref >60)
GFR calc non Af Amer: >60 mL/min (ref >60)
Glucose, Bld: 105 mg/dL — ABNORMAL HIGH (ref 70–99)
Potassium: 2.8 mmol/L — ABNORMAL LOW (ref 3.5–5.1)
Sodium: 108 mmol/L — CL (ref 135–145)
Total Bilirubin: 0.8 mg/dL (ref 0.3–1.2)
Total Protein: 6.2 g/dL — ABNORMAL LOW (ref 6.5–8.1)

## 2019-03-13 LAB — ABO/RH: ABO/RH(D): O POS

## 2019-03-13 LAB — SARS CORONAVIRUS 2 BY RT PCR (HOSPITAL ORDER, PERFORMED IN ~~LOC~~ HOSPITAL LAB): SARS Coronavirus 2: NEGATIVE

## 2019-03-13 LAB — GLUCOSE, CAPILLARY: Glucose-Capillary: 87 mg/dL (ref 70–99)

## 2019-03-13 LAB — LIPASE, BLOOD: Lipase: 26 U/L (ref 11–51)

## 2019-03-13 LAB — MAGNESIUM: Magnesium: 2.2 mg/dL (ref 1.7–2.4)

## 2019-03-13 LAB — ETHANOL: Alcohol, Ethyl (B): 213 mg/dL — ABNORMAL HIGH (ref <10)

## 2019-03-13 LAB — APTT: aPTT: 28 seconds (ref 24–36)

## 2019-03-13 LAB — PHOSPHORUS: Phosphorus: 2.1 mg/dL — ABNORMAL LOW (ref 2.5–4.6)

## 2019-03-13 MED ORDER — ADULT MULTIVITAMIN W/MINERALS CH
1.0000 | ORAL_TABLET | Freq: Every day | ORAL | Status: DC
  Administered 2019-03-13 – 2019-03-19 (×7): 1 via ORAL
  Filled 2019-03-13 (×7): qty 1

## 2019-03-13 MED ORDER — PANTOPRAZOLE SODIUM 40 MG IV SOLR
40.0000 mg | Freq: Two times a day (BID) | INTRAVENOUS | Status: DC
  Administered 2019-03-13 – 2019-03-16 (×6): 40 mg via INTRAVENOUS
  Filled 2019-03-13 (×7): qty 40

## 2019-03-13 MED ORDER — SODIUM CHLORIDE 0.9 % IV SOLN
INTRAVENOUS | Status: DC | PRN
  Administered 2019-03-13: 250 mL via INTRAVENOUS

## 2019-03-13 MED ORDER — MIDAZOLAM HCL 2 MG/2ML IJ SOLN: 1.0000 mg | INTRAMUSCULAR | Status: DC | PRN

## 2019-03-13 MED ORDER — ONDANSETRON HCL 4 MG/2ML IJ SOLN
4.0000 mg | Freq: Four times a day (QID) | INTRAMUSCULAR | Status: DC | PRN
  Administered 2019-03-13: 4 mg via INTRAVENOUS
  Filled 2019-03-13 (×2): qty 2

## 2019-03-13 MED ORDER — NICOTINE 14 MG/24HR TD PT24
14.0000 mg | MEDICATED_PATCH | Freq: Every day | TRANSDERMAL | Status: DC
  Administered 2019-03-13 – 2019-03-19 (×7): 14 mg via TRANSDERMAL
  Filled 2019-03-13 (×7): qty 1

## 2019-03-13 MED ORDER — SODIUM CHLORIDE 0.9 % IV SOLN: 1.0000 g | Freq: Once | INTRAVENOUS | Status: DC

## 2019-03-13 MED ORDER — FOLIC ACID 1 MG PO TABS
1.0000 mg | ORAL_TABLET | Freq: Every day | ORAL | Status: DC
  Administered 2019-03-13 – 2019-03-19 (×7): 1 mg via ORAL
  Filled 2019-03-13 (×7): qty 1

## 2019-03-13 MED ORDER — POTASSIUM CHLORIDE 20 MEQ PO PACK
40.0000 meq | PACK | ORAL | Status: DC
  Administered 2019-03-13: 40 meq via ORAL
  Filled 2019-03-13 (×3): qty 2

## 2019-03-13 MED ORDER — POTASSIUM CHLORIDE CRYS ER 20 MEQ PO TBCR
40.0000 meq | EXTENDED_RELEASE_TABLET | ORAL | Status: AC
  Administered 2019-03-13 – 2019-03-14 (×2): 40 meq via ORAL
  Filled 2019-03-13 (×2): qty 2

## 2019-03-13 MED ORDER — SODIUM CHLORIDE 0.9 % IV SOLN
INTRAVENOUS | Status: DC
  Administered 2019-03-13: 21:00:00 via INTRAVENOUS

## 2019-03-13 MED ORDER — MAGNESIUM SULFATE 2 GM/50ML IV SOLN: 2.0000 g | Freq: Once | INTRAVENOUS | Status: DC

## 2019-03-13 MED ORDER — POTASSIUM CHLORIDE 10 MEQ/100ML IV SOLN
10.0000 meq | INTRAVENOUS | Status: AC
  Administered 2019-03-13 (×3): 10 meq via INTRAVENOUS
  Filled 2019-03-13 (×3): qty 100

## 2019-03-13 MED ORDER — CALCIUM GLUCONATE-NACL 1-0.675 GM/50ML-% IV SOLN
1.0000 g | Freq: Once | INTRAVENOUS | Status: AC
  Administered 2019-03-13: 1000 mg via INTRAVENOUS
  Filled 2019-03-13: qty 50

## 2019-03-13 MED ORDER — ONDANSETRON HCL 4 MG/2ML IJ SOLN
4.0000 mg | Freq: Once | INTRAMUSCULAR | Status: AC
  Administered 2019-03-13: 4 mg via INTRAVENOUS
  Filled 2019-03-13: qty 2

## 2019-03-13 MED ORDER — VITAMIN B-1 100 MG PO TABS
100.0000 mg | ORAL_TABLET | Freq: Every day | ORAL | Status: DC
  Administered 2019-03-13 – 2019-03-19 (×7): 100 mg via ORAL
  Filled 2019-03-13 (×7): qty 1

## 2019-03-13 MED ORDER — MAGNESIUM SULFATE 2 GM/50ML IV SOLN
2.0000 g | Freq: Once | INTRAVENOUS | Status: AC
  Administered 2019-03-13: 2 g via INTRAVENOUS
  Filled 2019-03-13: qty 50

## 2019-03-13 MED ORDER — METOCLOPRAMIDE HCL 5 MG/ML IJ SOLN
5.0000 mg | Freq: Once | INTRAMUSCULAR | Status: AC
  Administered 2019-03-13: 5 mg via INTRAVENOUS
  Filled 2019-03-13: qty 2

## 2019-03-13 MED ORDER — SODIUM CHLORIDE 0.9 % IV BOLUS
1000.0000 mL | Freq: Once | INTRAVENOUS | Status: AC
  Administered 2019-03-13: 1000 mL via INTRAVENOUS

## 2019-03-13 NOTE — ED Triage Notes (Signed)
Pt to ER after a motel staff member called EMS after patient fell and struck the right orbital region of his head. Laceration present. EMS reported vomiting, no vomiting here. Pt reports chest pain onset this morning, substernal without radiation, described as pressure. Pt is intoxicated. Has had 12 bud light today, states on a normal day he drinks 2-3 12 packs, has for 10-15 years.

## 2019-03-13 NOTE — ED Provider Notes (Signed)
[facility] EMERGENCY DEPARTMENT Provider Note   CSN: [id number] Arrival date & time: 03/13/19  1215    History   Chief Complaint Chief Complaint  Patient presents with   Chest Pain   Fall   Alcohol Intoxication    HPI Samuel Reyes is a 63 y.o. male with a past medical history of CAD, alcohol abuse, GERD, COPD, prior CVA, HTN, who presents to ED for fall.  Patient states that he has been drinking a 12 pack of Bud Light today.  States that he drinks alcohol daily.  States that he fell down because he lost his balance.  Hit the right side of his head.  He has been living in a motel. Also states that he has been vomiting.  Endorses chest pain intermittently for the past 2 to 3 days.  Also reports abdominal pain.  Denies any diarrhea, fever, vision changes, numbness in arms or legs.     HPI  Past Medical History:  Diagnosis Date   Alcoholism ([place])    CAD (coronary artery disease) 2002   MI, no intervention required   COPD (chronic obstructive pulmonary disease) (HCC)    not on home O2   Depression    GERD (gastroesophageal reflux disease)    Headache    Hypertension    Stroke [facility]) 2006    Patient Active Problem List   Diagnosis Date Noted   QT prolongation 09/09/2018   Overdose 09/06/2018   Substance induced mood disorder ([place]) 07/25/2018   MDD (major depressive disorder), recurrent episode, severe ([place]) 07/18/2018   MDD (major depressive disorder), recurrent severe, without psychosis ([place]) 08/26/2017   Alcohol withdrawal ([place]) 08/08/2017   COPD with acute bronchitis ([place]) 07/29/2017   Alcohol use    Homelessness 06/24/2017   Lung nodule 09/03/2016   Tobacco abuse 09/03/2016   Insomnia 09/03/2016   HCAP (healthcare-associated pneumonia) 08/22/2016   Bronchiolitis 08/21/2016   Malnutrition of moderate degree 08/04/2016   Atrial tachycardia ([place]) 08/02/2016   Impaired glucose tolerance 08/02/2016   Alcohol-induced  mood disorder ([place]) 12/12/2015   Syncope 11/15/2015   Chest pain 11/15/2015   Nausea vomiting and diarrhea 11/15/2015   Abdominal pain 11/15/2015   Major depressive disorder, recurrent episode, moderate with anxious distress ([place]) 10/30/2015   Alcohol use disorder, severe, dependence ([place]) 10/29/2015   COPD exacerbation ([place]) 09/20/2015   Acute respiratory failure with hypoxia ([place]) 09/18/2015   Suicidal ideation 09/17/2015   Alcohol intoxication ([place]) 09/17/2015   Depression 09/17/2015   Benign essential HTN 09/17/2015   Hypokalemia 09/17/2015   Hyponatremia 09/17/2015   Coffee ground emesis 09/17/2015   COPD (chronic obstructive pulmonary disease) ([place]) 02/17/2013   GERD (gastroesophageal reflux disease) 02/17/2013    Past Surgical History:  Procedure Laterality Date   BACK SURGERY     3 cervical spine surgeries C4-C5 fused   COLONOSCOPY N/A 01/04/2014   Procedure: COLONOSCOPY;  Surgeon: Danie Binder, MD;  Location: [facility];  Service: Endoscopy;  Laterality: N/A;  1:45   ESOPHAGOGASTRODUODENOSCOPY N/A 01/04/2014   Procedure: ESOPHAGOGASTRODUODENOSCOPY (EGD);  Surgeon: Danie Binder, MD;  Location: [facility] END[facility] SUITE;  Service: Endoscopy;  Laterality: N/A;   FINGER SURGERY Left    2nd, 3rd, & 4th fingers were cut off by table saw and reattached   GASTRECTOMY     HERNIA REPAIR     INCISIONAL HERNIA REPAIR N/A 01/20/2014   Procedure: LAPAROSCOPIC RECURRENT  INCISIONAL HERNIA with mesh;  Surgeon: Edward Jolly, MD;  Location:  [facility] ORS;  Service: General;  Laterality: N/A;   rt knee arthroscopic surgery     SHOULDER SURGERY Bilateral    3 surgeries on on left, 2 surgeries on right         Home Medications    Prior to Admission medications   Medication Sig Start Date End Date Taking? Authorizing Provider  albuterol (VENTOLIN HFA) 108 (90 Base) MCG/ACT inhaler Inhale 2 puffs into the lungs every 4 (four) hours as needed for wheezing or shortness  of breath. 01/15/19   Ladell Pier, MD  diphenhydrAMINE (BENADRYL) 25 MG tablet Take 25 mg by mouth every 6 (six) hours as needed.    [provider]  ibuprofen (ADVIL) 400 MG tablet Take 400 mg by mouth every 6 (six) hours as needed.    [provider]  nicotine (NICODERM CQ - DOSED IN MG/24 HOURS) 21 mg/24hr patch Place 1 patch (21 mg total) onto the skin daily. 01/15/19   Ladell Pier, MD  pantoprazole (PROTONIX) 40 MG tablet Take 1 tablet (40 mg total) by mouth daily. For reflux 01/15/19   Ladell Pier, MD  permethrin Nancy Fetter) 5 % cream Apply to entire body once 12/29/18   Julianne Rice, MD  prazosin (MINIPRESS) 1 MG capsule Take 1 capsule (1 mg total) by mouth at bedtime. For nightmares 01/15/19   Ladell Pier, MD  predniSONE (DELTASONE) 20 MG tablet Take 1 tablet (20 mg total) by mouth daily. 12/31/18   Davonna Belling, MD  sertraline (ZOLOFT) 50 MG tablet Take 1 tablet (50 mg total) by mouth daily. For mood 01/15/19   Ladell Pier, MD  traZODone (DESYREL) 50 MG tablet Take 2 tablets (100 mg total) by mouth at bedtime and may repeat dose one time if needed. For sleep 01/15/19   Ladell Pier, MD    Family History Family History  Problem Relation Age of Onset   Cancer Father        bone   Cancer Brother        lungs   Stroke Maternal Grandmother    Asthma Son        died at age 73 in his sleep    Spina bifida Son        died at age 45    Dementia Mother    Colon cancer Neg Hx     Social History Social History   Tobacco Use   Smoking status: Former Smoker    Packs/day: 1.00    Years: 52.00    Pack years: 52.00    Types: Cigarettes    Start date: 07/30/1966   Smokeless tobacco: Never Used   Tobacco comment: uses nicotine patch  Substance Use Topics   Alcohol use: Not Currently    Alcohol/week: 3.0 - 4.0 standard drinks    Types: 3 - 4 Cans of beer per week    Comment: not drink for 8 weeks   Drug use: No     Comment: denied using any drugs     Allergies   Bee venom, Penicillins, and Vancomycin   Review of Systems Review of Systems  Unable to perform ROS: Other (Intoxicated)  HENT: Positive for facial swelling.   Cardiovascular: Positive for chest pain.  Gastrointestinal: Positive for vomiting.     Physical Exam Updated Vital Signs BP (!) 146/76    Pulse 90    Temp 98.3 F (36.8 C) (Oral)    Resp (!) 22    SpO2 97%  Physical Exam Vitals signs and nursing note reviewed.  Constitutional:      General: He is not in acute distress.    Appearance: He is well-developed.     Comments: Appears intoxicated.  HENT:     Head: Normocephalic and atraumatic.     Comments: Abrasions over R eyebrow with edema noted.     Nose: Nose normal.  Eyes:     General: No scleral icterus.       Right eye: No discharge.        Left eye: No discharge.     Extraocular Movements: Extraocular movements intact.     Conjunctiva/sclera: Conjunctivae normal.     Pupils: Pupils are equal, round, and reactive to light.  Neck:     Musculoskeletal: Normal range of motion and neck supple.  Cardiovascular:     Rate and Rhythm: Normal rate and regular rhythm.     Heart sounds: Normal heart sounds. No murmur. No friction rub. No gallop.   Pulmonary:     Effort: Pulmonary effort is normal. Tachypnea present. No respiratory distress.     Breath sounds: Normal breath sounds.  Abdominal:     General: Bowel sounds are normal. There is no distension.     Palpations: Abdomen is soft.     Tenderness: There is no abdominal tenderness. There is no guarding.  Musculoskeletal: Normal range of motion.     Comments: C collar in place. No TTP of T, L spine at midline.  No step-off palpated. No objective signs of numbness present. No saddle anesthesia. 2+ DP pulses bilaterally. Sensation intact to light touch. Strength 5/5 in bilateral lower extremities.  Skin:    General: Skin is warm and dry.     Findings: No rash.    Neurological:     General: No focal deficit present.     Mental Status: He is alert. He is disoriented.     Cranial Nerves: No cranial nerve deficit.     Motor: No weakness or abnormal muscle tone.     Coordination: Coordination normal.     Comments: Alert, oriented to self, place. Disoriented to time. Knows the current president. Pupils reactive. No facial asymmetry noted. Cranial nerves appear grossly intact. Sensation intact to light touch on face, BUE and BLE. Strength 5/5 in BUE and BLE.       ED Treatments / Results  Labs (all labs ordered are listed, but only abnormal results are displayed) Labs Reviewed  COMPREHENSIVE METABOLIC PANEL - Abnormal; Notable for the following components:      Result Value   Sodium 108 (*)    Potassium 2.8 (*)    Chloride <65 (*)    Glucose, Bld 105 (*)    BUN 7 (*)    Calcium 7.7 (*)    Total Protein 6.2 (*)    Albumin 3.4 (*)    All other components within normal limits  ETHANOL - Abnormal; Notable for the following components:   Alcohol, Ethyl (B) 213 (*)    All other components within normal limits  CBC WITH DIFFERENTIAL/PLATELET - Abnormal; Notable for the following components:   WBC 16.6 (*)    HCT 37.0 (*)    MCV 77.2 (*)    RDW 18.2 (*)    Neutro Abs 13.0 (*)    Monocytes Absolute 2.1 (*)    Abs Immature Granulocytes 0.22 (*)    All other components within normal limits  SARS CORONAVIRUS 2 (HOSPITAL ORDER, [place]  LAB)  LIPASE, BLOOD  RAPID URINE DRUG SCREEN, HOSP PERFORMED  TROPONIN I (HIGH SENSITIVITY)  TROPONIN I (HIGH SENSITIVITY)    EKG None  Radiology Dg Chest 2 View  Result Date: 03/13/2019 CLINICAL DATA:  Chest pain EXAM: CHEST - 2 VIEW COMPARISON:  12/31/2018 FINDINGS: The heart size and mediastinal contours are within normal limits. Both lungs are clear. Clips at the GE junction and left upper quadrant. IMPRESSION: No active cardiopulmonary disease. Electronically Signed   By: Donavan Foil M.D.   On: 03/13/2019 13:56   Ct Head Wo Contrast  Result Date: 03/13/2019 CLINICAL DATA:  Head laceration after fall. EXAM: CT HEAD WITHOUT CONTRAST CT MAXILLOFACIAL WITHOUT CONTRAST CT CERVICAL SPINE WITHOUT CONTRAST TECHNIQUE: Multidetector CT imaging of the head, cervical spine, and maxillofacial structures were performed using the standard protocol without intravenous contrast. Multiplanar CT image reconstructions of the cervical spine and maxillofacial structures were also generated. COMPARISON:  None. FINDINGS: CT HEAD FINDINGS Brain: No evidence of acute infarction, hemorrhage, hydrocephalus, extra-axial collection or mass lesion/mass effect. Vascular: No hyperdense vessel or unexpected calcification. Skull: Normal. Negative for fracture or focal lesion. Other: None. CT MAXILLOFACIAL FINDINGS Osseous: No fracture or mandibular dislocation. No destructive process. Orbits: Negative. No traumatic or inflammatory finding. Sinuses: Clear. Soft tissues: Large right infraorbital soft tissue hematoma is noted. CT CERVICAL SPINE FINDINGS Alignment: Grade 1 anterolisthesis of C2-3 is noted secondary to posterior facet joint hypertrophy. Skull base and vertebrae: No acute fracture. No primary bone lesion or focal pathologic process. Soft tissues and spinal canal: No prevertebral fluid or swelling. No visible canal hematoma. Disc levels: Severe degenerative disc disease is noted at C3-4, C4-5 and C6-7. There appears to be fusion of the C5 and C6 vertebra which most likely is degenerative in etiology. Upper chest: Negative. Other: None. IMPRESSION: Normal head CT. Large right infraorbital soft tissue hematoma is noted. No definite osseous abnormality is noted in the maxillofacial region. Severe multilevel degenerative disc disease is noted in the cervical spine. No fracture or other acute abnormality is noted. Electronically Signed   By: Marijo Conception M.D.   On: 03/13/2019 14:59   Ct Cervical Spine Wo  Contrast  Result Date: 03/13/2019 CLINICAL DATA:  Head laceration after fall. EXAM: CT HEAD WITHOUT CONTRAST CT MAXILLOFACIAL WITHOUT CONTRAST CT CERVICAL SPINE WITHOUT CONTRAST TECHNIQUE: Multidetector CT imaging of the head, cervical spine, and maxillofacial structures were performed using the standard protocol without intravenous contrast. Multiplanar CT image reconstructions of the cervical spine and maxillofacial structures were also generated. COMPARISON:  None. FINDINGS: CT HEAD FINDINGS Brain: No evidence of acute infarction, hemorrhage, hydrocephalus, extra-axial collection or mass lesion/mass effect. Vascular: No hyperdense vessel or unexpected calcification. Skull: Normal. Negative for fracture or focal lesion. Other: None. CT MAXILLOFACIAL FINDINGS Osseous: No fracture or mandibular dislocation. No destructive process. Orbits: Negative. No traumatic or inflammatory finding. Sinuses: Clear. Soft tissues: Large right infraorbital soft tissue hematoma is noted. CT CERVICAL SPINE FINDINGS Alignment: Grade 1 anterolisthesis of C2-3 is noted secondary to posterior facet joint hypertrophy. Skull base and vertebrae: No acute fracture. No primary bone lesion or focal pathologic process. Soft tissues and spinal canal: No prevertebral fluid or swelling. No visible canal hematoma. Disc levels: Severe degenerative disc disease is noted at C3-4, C4-5 and C6-7. There appears to be fusion of the C5 and C6 vertebra which most likely is degenerative in etiology. Upper chest: Negative. Other: None. IMPRESSION: Normal head CT. Large right infraorbital soft tissue hematoma is noted. No  definite osseous abnormality is noted in the maxillofacial region. Severe multilevel degenerative disc disease is noted in the cervical spine. No fracture or other acute abnormality is noted. Electronically Signed   By: Marijo Conception M.D.   On: 03/13/2019 14:59   Ct Maxillofacial Wo Contrast  Result Date: 03/13/2019 CLINICAL DATA:   Head laceration after fall. EXAM: CT HEAD WITHOUT CONTRAST CT MAXILLOFACIAL WITHOUT CONTRAST CT CERVICAL SPINE WITHOUT CONTRAST TECHNIQUE: Multidetector CT imaging of the head, cervical spine, and maxillofacial structures were performed using the standard protocol without intravenous contrast. Multiplanar CT image reconstructions of the cervical spine and maxillofacial structures were also generated. COMPARISON:  None. FINDINGS: CT HEAD FINDINGS Brain: No evidence of acute infarction, hemorrhage, hydrocephalus, extra-axial collection or mass lesion/mass effect. Vascular: No hyperdense vessel or unexpected calcification. Skull: Normal. Negative for fracture or focal lesion. Other: None. CT MAXILLOFACIAL FINDINGS Osseous: No fracture or mandibular dislocation. No destructive process. Orbits: Negative. No traumatic or inflammatory finding. Sinuses: Clear. Soft tissues: Large right infraorbital soft tissue hematoma is noted. CT CERVICAL SPINE FINDINGS Alignment: Grade 1 anterolisthesis of C2-3 is noted secondary to posterior facet joint hypertrophy. Skull base and vertebrae: No acute fracture. No primary bone lesion or focal pathologic process. Soft tissues and spinal canal: No prevertebral fluid or swelling. No visible canal hematoma. Disc levels: Severe degenerative disc disease is noted at C3-4, C4-5 and C6-7. There appears to be fusion of the C5 and C6 vertebra which most likely is degenerative in etiology. Upper chest: Negative. Other: None. IMPRESSION: Normal head CT. Large right infraorbital soft tissue hematoma is noted. No definite osseous abnormality is noted in the maxillofacial region. Severe multilevel degenerative disc disease is noted in the cervical spine. No fracture or other acute abnormality is noted. Electronically Signed   By: Marijo Conception M.D.   On: 03/13/2019 14:59    Procedures .Critical Care Performed by: Delia Heady, PA-C Authorized by: Delia Heady, PA-C   Critical care provider  statement:    Critical care time (minutes):  35   Critical care time was exclusive of:  Separately billable procedures and treating other patients   Critical care was necessary to treat or prevent imminent or life-threatening deterioration of the following conditions:  Circulatory failure, CNS failure or compromise, shock, respiratory failure and cardiac failure   Critical care was time spent personally by me on the following activities:  Blood draw for specimens, discussions with consultants, examination of patient, obtaining history from patient or surrogate, ordering and performing treatments and interventions, ordering and review of laboratory studies, re-evaluation of patient's condition, pulse oximetry, ordering and review of radiographic studies and review of old charts   (including critical care time)  Medications Ordered in ED Medications  ondansetron (ZOFRAN) injection 4 mg (0 mg Intravenous Hold 03/13/19 1440)  potassium chloride 10 mEq in 100 mL IVPB (has no administration in time range)  magnesium sulfate IVPB 2 g 50 mL (has no administration in time range)  calcium gluconate 1 g/ 50 mL sodium chloride IVPB (has no administration in time range)  metoCLOPramide (REGLAN) injection 5 mg (5 mg Intravenous Given 03/13/19 1254)  sodium chloride 0.9 % bolus 1,000 mL (1,000 mLs Intravenous New Bag/Given 03/13/19 1440)     Initial Impression / Assessment and Plan / ED Course  I have reviewed the triage vital signs and the nursing notes.  Pertinent labs & imaging results that were available during my care of the patient were reviewed by me and considered in  my medical decision making (see chart for details).  Clinical Course as of Mar 12 1521  Fri Mar 13, 2019  1416 Given IV NS bolus.  Sodium(!!): 108 [HK]  1416 Called CT, they state patient is next in line.   [HK]    Clinical Course User Index [HK] Delia Heady, [place]       63yo M presents to ED for injuries after fall that  occurred prior to arrival. Patient has a history of alcohol abuse, states that he drinks 2-3 12pk of beer daily. He drank one pack this morning. He states he fell and hit his head because he lost his balance. He appears intoxicated so remainder of history is limited. He is alert, oriented to self and place, not time.  C-collar in place.  Labs are significant for hyponatremia 108, significantly lower than any of his past values.  Most recent value was in April 2020 and was 139.  Unsure time of onset.  CT of the head, face and cervical spine is unremarkable.  Chest x-ray is unremarkable.  Initial troponin is negative.  Ethanol level of 218. Patient remains alert, able to answer questions. Will order 1L bolus of NS and call critical care to admit patient.  Final Clinical Impressions(s) / ED Diagnoses   Final diagnoses:  Hyponatremia  Hypokalemia  Alcohol abuse  Injury of head, initial encounter    ED Discharge Orders    None       Delia Heady, PA-C 03/13/19 1531    Maudie Flakes, MD 03/14/19 [phone]

## 2019-03-13 NOTE — ED Notes (Signed)
Attempted report 

## 2019-03-13 NOTE — ED Notes (Signed)
ED TO INPATIENT HANDOFF REPORT  ED Nurse Name and Phone #: Caprice Kluver [phone]  S Name/Age/Gender Samuel Reyes 63 y.o. male Room/Bed: 038C/038C  Code Status   Code Status: Full Code  Home/SNF/Other Home Patient oriented to: self, place, time and situation Is this baseline? Yes   Triage Complete: Triage complete  Chief Complaint facial injuries/ETOH  Triage Note Pt to ER after a motel staff member called EMS after patient fell and struck the right orbital region of his head. Laceration present. EMS reported vomiting, no vomiting here. Pt reports chest pain onset this morning, substernal without radiation, described as pressure. Pt is intoxicated. Has had 12 bud light today, states on a normal day he drinks 2-3 12 packs, has for 10-15 years.    Allergies Allergies  Allergen Reactions  . Bee Venom Anaphylaxis  . Penicillins Rash    Has patient had a PCN reaction causing immediate rash, facial/tongue/throat swelling, SOB or lightheadedness with hypotension: {Yes Has patient had a PCN reaction causing severe rash involving mucus membranes or skin necrosis: YES Has patient had a PCN reaction that required hospitalization Yes Has patient had a PCN reaction occurring within the last 10 years: YES If all of the above answers are "NO", then may proceed with Cephalosporin use.   . Vancomycin Tinitus    Level of Care/Admitting Diagnosis ED Disposition    ED Disposition Condition [facility] Area: [place] [[id number]]  Level of Care: ICU [6]  Covid Evaluation: Confirmed COVID Negative  Diagnosis: Hyponatremia [474259]  Admitting Physician: Julian Hy [[id number]]  Attending Physician: Julian Hy [[id number]]  Estimated length of stay: 3 - 4 days  Certification:: I certify this patient will need inpatient services for at least 2 midnights  PT Class (Do Not Modify): Inpatient [101]  PT Acc Code (Do Not Modify): Private [1]        B Medical/Surgery History Past Medical History:  Diagnosis Date  . Alcoholism ([place])   . CAD (coronary artery disease) 2002   MI, no intervention required  . COPD (chronic obstructive pulmonary disease) (HCC)    not on home O2  . Depression   . GERD (gastroesophageal reflux disease)   . Headache   . Hypertension   . Stroke [facility]) 2006   Past Surgical History:  Procedure Laterality Date  . BACK SURGERY     3 cervical spine surgeries C4-C5 fused  . COLONOSCOPY N/A 01/04/2014   Procedure: COLONOSCOPY;  Surgeon: Danie Binder, MD;  Location[facility];  Service: Endoscopy;  Laterality: N/A;  1:45  . ESOPHAGOGASTRODUODENOSCOPY N/A 01/04/2014   Procedure: ESOPHAGOGASTRODUODENOSCOPY (EGD);  Surgeon: Danie Binder, MD;  Location: [facility];  Service: Endoscopy;  Laterality: N/A;  . FINGER SURGERY Left    2nd, 3rd, & 4th fingers were cut off by table saw and reattached  . GASTRECTOMY    . HERNIA REPAIR    . INCISIONAL HERNIA REPAIR N/A 01/20/2014   Procedure: LAPAROSCOPIC RECURRENT  INCISIONAL HERNIA with mesh;  Surgeon: Edward Jolly, MD;  Location: [facility] ORS;  Service: General;  Laterality: N/A;  . rt knee arthroscopic surgery    . SHOULDER SURGERY Bilateral    3 surgeries on on left, 2 surgeries on right      A IV Location/Drains/Wounds Patient Lines/Drains/Airways Status   Active Line/Drains/Airways    Name:   Placement date:   Placement time:   Site:   Days:   Peripheral IV 03/13/19  Right Antecubital   03/13/19    1247    Antecubital   less than 1          Intake/Output Last 24 hours  Intake/Output Summary (Last 24 hours) at 03/13/2019 1801 Last data filed at 03/13/2019 1730 Gross per 24 hour  Intake 1154.53 ml  Output -  Net 1154.53 ml    Labs/Imaging Results for orders placed or performed during the hospital encounter of 03/13/19 (from the past 48 hour(s))  Comprehensive metabolic panel     Status: Abnormal   Collection Time: 03/13/19 12:48 PM   Result Value Ref Range   Sodium 108 (LL) 135 - 145 mmol/L    Comment: CRITICAL RESULT CALLED TO, READ BACK BY AND VERIFIED WITH: H.HALL,RN 1355 03/13/2019 CLARK,S    Potassium 2.8 (L) 3.5 - 5.1 mmol/L   Chloride <65 (LL) 98 - 111 mmol/L    Comment: CRITICAL RESULT CALLED TO, READ BACK BY AND VERIFIED WITH: H.HALL,RN 1355 03/13/2019 CLARK,S    CO2 27 22 - 32 mmol/L   Glucose, Bld 105 (H) 70 - 99 mg/dL   BUN 7 (L) 8 - 23 mg/dL   Creatinine, Ser 0.63 0.61 - 1.24 mg/dL   Calcium 7.7 (L) 8.9 - 10.3 mg/dL   Total Protein 6.2 (L) 6.5 - 8.1 g/dL   Albumin 3.4 (L) 3.5 - 5.0 g/dL   AST 35 15 - 41 U/L   ALT 17 0 - 44 U/L   Alkaline Phosphatase 89 38 - 126 U/L   Total Bilirubin 0.8 0.3 - 1.2 mg/dL   GFR calc non Af Amer >60 >60 mL/min   GFR calc Af Amer >60 >60 mL/min   Anion gap NOT CALCULATED 5 - 15    Comment: Performed at [facility], [address], [place], [place] [address]  CBC with Differential     Status: Abnormal   Collection Time: 03/13/19 12:48 PM  Result Value Ref Range   WBC 16.6 (H) 4.0 - 10.5 K/uL   RBC 4.79 4.22 - 5.81 MIL/uL   Hemoglobin 13.0 13.0 - 17.0 g/dL   HCT 37.0 (L) 39.0 - 52.0 %   MCV 77.2 (L) 80.0 - 100.0 fL   MCH 27.1 26.0 - 34.0 pg   MCHC 35.1 30.0 - 36.0 g/dL   RDW 18.2 (H) 11.5 - 15.5 %   Platelets 253 150 - 400 K/uL   nRBC 0.0 0.0 - 0.2 %   Neutrophils Relative % 79 %   Neutro Abs 13.0 (H) 1.7 - 7.7 K/uL   Lymphocytes Relative 7 %   Lymphs Abs 1.2 0.7 - 4.0 K/uL   Monocytes Relative 13 %   Monocytes Absolute 2.1 (H) 0.1 - 1.0 K/uL   Eosinophils Relative 0 %   Eosinophils Absolute 0.0 0.0 - 0.5 K/uL   Basophils Relative 0 %   Basophils Absolute 0.0 0.0 - 0.1 K/uL   Immature Granulocytes 1 %   Abs Immature Granulocytes 0.22 (H) 0.00 - 0.07 K/uL    Comment: Performed at [place] [address], [place], [place] [address]  Lipase, blood     Status: None   Collection Time: 03/13/19 12:48 PM  Result Value Ref Range   Lipase 26 11  - 51 U/L    Comment: Performed at [facility], [place] [address] [place],  [address]  Troponin I (High Sensitivity)     Status: None   Collection Time: 03/13/19 12:48 PM  Result Value Ref  Range   Troponin I (High Sensitivity) 10 <18 ng/L    Comment: (NOTE) Elevated high sensitivity troponin I (hsTnI) values and significant  changes across serial measurements may suggest ACS but many other  chronic and acute conditions are known to elevate hsTnI results.  Refer to the Links section for chest pain algorithms and additional  guidance. Performed at [facility], [place] [address], [place], [place] [address]   Ethanol     Status: Abnormal   Collection Time: 03/13/19 12:49 PM  Result Value Ref Range   Alcohol, Ethyl (B) 213 (H) <10 mg/dL    Comment: (NOTE) Lowest detectable limit for serum alcohol is 10 mg/dL. For medical purposes only. Performed at [facility], [place] [address], [place], [place] [address]   SARS Coronavirus 2 [name] order, Performed in [facility] hospital lab) Nasopharyngeal Nasopharyngeal Swab     Status: None   Collection Time: 03/13/19  3:50 PM   Specimen: Nasopharyngeal Swab  Result Value Ref Range   SARS Coronavirus 2 NEGATIVE NEGATIVE    Comment: (NOTE) If result is NEGATIVE SARS-CoV-2 target nucleic acids are NOT DETECTED. The SARS-CoV-2 RNA is generally detectable in upper and lower  respiratory specimens during the acute phase of infection. The lowest  concentration of SARS-CoV-2 viral copies this assay can detect is 250  copies / mL. A negative result does not preclude SARS-CoV-2 infection  and should not be used as the sole basis for treatment or other  patient management decisions.  A negative result may occur with  improper specimen collection / handling, submission of specimen other  than nasopharyngeal swab, presence of viral mutation(s) within the  areas targeted by this assay, and inadequate number of viral copies  (<250  copies / mL). A negative result must be combined with clinical  observations, patient history, and epidemiological information. If result is POSITIVE SARS-CoV-2 target nucleic acids are DETECTED. The SARS-CoV-2 RNA is generally detectable in upper and lower  respiratory specimens dur ing the acute phase of infection.  Positive  results are indicative of active infection with SARS-CoV-2.  Clinical  correlation with patient history and other diagnostic information is  necessary to determine patient infection status.  Positive results do  not rule out bacterial infection or co-infection with other viruses. If result is PRESUMPTIVE POSTIVE SARS-CoV-2 nucleic acids MAY BE PRESENT.   A presumptive positive result was obtained on the submitted specimen  and confirmed on repeat testing.  While 2019 novel coronavirus  (SARS-CoV-2) nucleic acids may be present in the submitted sample  additional confirmatory testing may be necessary for epidemiological  and / or clinical management purposes  to differentiate between  SARS-CoV-2 and other Sarbecovirus currently known to infect humans.  If clinically indicated additional testing with an alternate test  methodology [phone]) is advised. The SARS-CoV-2 RNA is generally  detectable in upper and lower respiratory sp ecimens during the acute  phase of infection. The expected result is Negative. Fact Sheet for Patients:  StrictlyIdeas.no Fact Sheet for Healthcare Providers: BankingDealers.co.za This test is not yet approved or cleared by the [place] FDA and has been authorized for detection and/or diagnosis of SARS-CoV-2 by FDA under an Emergency Use Authorization (EUA).  This EUA will remain in effect (meaning this test can be used) for the duration of the COVID-19 declaration under Section 564(b)(1) of the Act, 21 U.S.C. section 360bbb-3(b)(1), unless the authorization is terminated or revoked  sooner. Performed at [facility], [address] [place], [place] [address]    Dg Chest 2 View  Result Date: 03/13/2019 CLINICAL DATA:  Chest pain EXAM: CHEST - 2 VIEW COMPARISON:  12/31/2018 FINDINGS: The heart size and mediastinal contours are within normal limits. Both lungs are clear. Clips at the GE junction and left upper quadrant. IMPRESSION: No active cardiopulmonary disease. Electronically Signed   By: Donavan Foil M.D.   On: 03/13/2019 13:56   Ct Head Wo Contrast  Result Date: 03/13/2019 CLINICAL DATA:  Head laceration after fall. EXAM: CT HEAD WITHOUT CONTRAST CT MAXILLOFACIAL WITHOUT CONTRAST CT CERVICAL SPINE WITHOUT CONTRAST TECHNIQUE: Multidetector CT imaging of the head, cervical spine, and maxillofacial structures were performed using the standard protocol without intravenous contrast. Multiplanar CT image reconstructions of the cervical spine and maxillofacial structures were also generated. COMPARISON:  None. FINDINGS: CT HEAD FINDINGS Brain: No evidence of acute infarction, hemorrhage, hydrocephalus, extra-axial collection or mass lesion/mass effect. Vascular: No hyperdense vessel or unexpected calcification. Skull: Normal. Negative for fracture or focal lesion. Other: None. CT MAXILLOFACIAL FINDINGS Osseous: No fracture or mandibular dislocation. No destructive process. Orbits: Negative. No traumatic or inflammatory finding. Sinuses: Clear. Soft tissues: Large right infraorbital soft tissue hematoma is noted. CT CERVICAL SPINE FINDINGS Alignment: Grade 1 anterolisthesis of C2-3 is noted secondary to posterior facet joint hypertrophy. Skull base and vertebrae: No acute fracture. No primary bone lesion or focal pathologic process. Soft tissues and spinal canal: No prevertebral fluid or swelling. No visible canal hematoma. Disc levels: Severe degenerative disc disease is noted at C3-4, C4-5 and C6-7. There appears to be fusion of the C5 and C6 vertebra which most likely is  degenerative in etiology. Upper chest: Negative. Other: None. IMPRESSION: Normal head CT. Large right infraorbital soft tissue hematoma is noted. No definite osseous abnormality is noted in the maxillofacial region. Severe multilevel degenerative disc disease is noted in the cervical spine. No fracture or other acute abnormality is noted. Electronically Signed   By: Marijo Conception M.D.   On: 03/13/2019 14:59   Ct Cervical Spine Wo Contrast  Result Date: 03/13/2019 CLINICAL DATA:  Head laceration after fall. EXAM: CT HEAD WITHOUT CONTRAST CT MAXILLOFACIAL WITHOUT CONTRAST CT CERVICAL SPINE WITHOUT CONTRAST TECHNIQUE: Multidetector CT imaging of the head, cervical spine, and maxillofacial structures were performed using the standard protocol without intravenous contrast. Multiplanar CT image reconstructions of the cervical spine and maxillofacial structures were also generated. COMPARISON:  None. FINDINGS: CT HEAD FINDINGS Brain: No evidence of acute infarction, hemorrhage, hydrocephalus, extra-axial collection or mass lesion/mass effect. Vascular: No hyperdense vessel or unexpected calcification. Skull: Normal. Negative for fracture or focal lesion. Other: None. CT MAXILLOFACIAL FINDINGS Osseous: No fracture or mandibular dislocation. No destructive process. Orbits: Negative. No traumatic or inflammatory finding. Sinuses: Clear. Soft tissues: Large right infraorbital soft tissue hematoma is noted. CT CERVICAL SPINE FINDINGS Alignment: Grade 1 anterolisthesis of C2-3 is noted secondary to posterior facet joint hypertrophy. Skull base and vertebrae: No acute fracture. No primary bone lesion or focal pathologic process. Soft tissues and spinal canal: No prevertebral fluid or swelling. No visible canal hematoma. Disc levels: Severe degenerative disc disease is noted at C3-4, C4-5 and C6-7. There appears to be fusion of the C5 and C6 vertebra which most likely is degenerative in etiology. Upper chest: Negative.  Other: None. IMPRESSION: Normal head CT. Large right infraorbital soft tissue hematoma is noted. No definite osseous abnormality is noted in the maxillofacial region. Severe multilevel degenerative disc disease is noted in the cervical  spine. No fracture or other acute abnormality is noted. Electronically Signed   By: Marijo Conception M.D.   On: 03/13/2019 14:59   Ct Maxillofacial Wo Contrast  Result Date: 03/13/2019 CLINICAL DATA:  Head laceration after fall. EXAM: CT HEAD WITHOUT CONTRAST CT MAXILLOFACIAL WITHOUT CONTRAST CT CERVICAL SPINE WITHOUT CONTRAST TECHNIQUE: Multidetector CT imaging of the head, cervical spine, and maxillofacial structures were performed using the standard protocol without intravenous contrast. Multiplanar CT image reconstructions of the cervical spine and maxillofacial structures were also generated. COMPARISON:  None. FINDINGS: CT HEAD FINDINGS Brain: No evidence of acute infarction, hemorrhage, hydrocephalus, extra-axial collection or mass lesion/mass effect. Vascular: No hyperdense vessel or unexpected calcification. Skull: Normal. Negative for fracture or focal lesion. Other: None. CT MAXILLOFACIAL FINDINGS Osseous: No fracture or mandibular dislocation. No destructive process. Orbits: Negative. No traumatic or inflammatory finding. Sinuses: Clear. Soft tissues: Large right infraorbital soft tissue hematoma is noted. CT CERVICAL SPINE FINDINGS Alignment: Grade 1 anterolisthesis of C2-3 is noted secondary to posterior facet joint hypertrophy. Skull base and vertebrae: No acute fracture. No primary bone lesion or focal pathologic process. Soft tissues and spinal canal: No prevertebral fluid or swelling. No visible canal hematoma. Disc levels: Severe degenerative disc disease is noted at C3-4, C4-5 and C6-7. There appears to be fusion of the C5 and C6 vertebra which most likely is degenerative in etiology. Upper chest: Negative. Other: None. IMPRESSION: Normal head CT. Large right  infraorbital soft tissue hematoma is noted. No definite osseous abnormality is noted in the maxillofacial region. Severe multilevel degenerative disc disease is noted in the cervical spine. No fracture or other acute abnormality is noted. Electronically Signed   By: Marijo Conception M.D.   On: 03/13/2019 14:59    Pending Labs Unresulted Labs (From admission, onward)    Start     Ordered   03/14/19 0500  Magnesium  Tomorrow morning,   R     03/13/19 1751   03/14/19 0500  Phosphorus  Tomorrow morning,   R     03/13/19 1751   03/14/19 0500  CBC  Tomorrow morning,   R     03/13/19 1751   03/13/19 6712  Basic metabolic panel  Now then every 4 hours,   R (with STAT occurrences)     03/13/19 1751   03/13/19 2200  Magnesium  Once,   STAT     03/13/19 1751   03/13/19 2200  Phosphorus  Once,   STAT     03/13/19 1751   03/13/19 2200  CBC  Once,   STAT     03/13/19 1751   03/13/19 4580  Basic metabolic panel  ONCE - STAT,   STAT     03/13/19 1727   03/13/19 1621  Protime-INR  Once,   STAT     03/13/19 1620   03/13/19 1621  APTT  Once,   STAT     03/13/19 1620   03/13/19 9983  Basic metabolic panel  ONCE - STAT,   STAT     03/13/19 1607   03/13/19 1608  Type and screen [place],   STAT    Comments: [place]    03/13/19 1607   03/13/19 1249  Urine rapid drug screen (hosp performed)  ONCE - STAT,   STAT     03/13/19 1250          Vitals/Pain Today's Vitals   03/13/19 1530 03/13/19 1545 03/13/19  1615 03/13/19 1630  BP: 140/82  (!) 154/99 113/84  Pulse: 85  87 85  Resp: (!) 22  17 17   Temp:      TempSrc:      SpO2: 96% 96% 98% 99%  PainSc:        Isolation Precautions Airborne and Contact precautions  Medications Medications  potassium chloride 10 mEq in 100 mL IVPB (10 mEq Intravenous New Bag/Given 03/13/19 1731)  pantoprazole (PROTONIX) injection 40 mg (40 mg Intravenous Given 03/13/19 1748)  ondansetron (ZOFRAN) injection 4  mg (has no administration in time range)  metoCLOPramide (REGLAN) injection 5 mg (5 mg Intravenous Given 03/13/19 1254)  sodium chloride 0.9 % bolus 1,000 mL (0 mLs Intravenous Stopped 03/13/19 1730)  ondansetron (ZOFRAN) injection 4 mg (4 mg Intravenous Given 03/13/19 1525)  magnesium sulfate IVPB 2 g 50 mL (0 g Intravenous Stopped 03/13/19 1730)  calcium gluconate 1 g/ 50 mL sodium chloride IVPB (0 g Intravenous Stopped 03/13/19 1615)    Mobility walks High fall risk   Focused Assessments NA   R Recommendations: See Admitting Provider Note  Report given to:   Additional Notes:

## 2019-03-13 NOTE — Progress Notes (Signed)
CRITICAL VALUE ALERT  Critical Value:  Na 112  Date & Time Notied:  03/13/19 10:35 PM  Provider Notified: Warren Lacy

## 2019-03-13 NOTE — H&P (Signed)
NAME:  Samuel Reyes, MRN:  [id number], DOB:  [birth year], LOS: 0 ADMISSION DATE:  03/13/2019, CONSULTATION DATE:  03/13/19 REFERRING MD:  Shelly Coss, CHIEF COMPLAINT:  Hyponatremia, AMS   Brief History   Samuel Reyes is a 63 y/o gentleman who presented after a fall, acutely intoxicated and hyponatremic.  History of present illness   Samuel Reyes is a 63 y/o gentleman who presents to the ED complaining of chest pain and nausea after having a fall at home. He drinks alcohol daily. He reports that the fall was due to losing his balance, and he hit the right side of his head when he fell. He has had intermittent chest pain for a few days. In the ED he was vomiting coffee ground emesis and was found to have a sodium of 108. He was given 1L NS. He has gone through DTs in previous admissions treated with PRN benzos.  Currently his only complaints are central chest burning, which he attributes to his reflux, and nausea, which is improved. He admits to feeling weak over the last few days.  He has been drinking about 30 beers a day for the last few days, and increased from his baseline of 12-15 beers per day.  He continues to smoke 1 pack/day.  He is on interested in attempting to quit.  He admits to not eating in the last few days.  Past Medical History  ETOH abuse CAD GERD, Barret's esophagus, h/o UGIB COPD CVA HTN UGIB MDD with suicide attempts & previous psychiatric hospitalizations  Significant Hospital Events     Consults:    Procedures:    Significant Diagnostic Tests:  CXR- no acute abnormalities EKG- NSR, prolonged QRS. No significant ST segment deviation or TWI Head CT- no bleeding or other acute abnormalities Neck CT- no fractures  Micro Data:  Sars CoV2-negative  Antimicrobials:    Interim history/subjective:    Objective   Blood pressure 113/84, pulse 85, temperature 98.3 F (36.8 C), temperature source Oral, resp. rate 17, SpO2 99 %.       No intake or output data in  the 24 hours ending 03/13/19 1526 There were no vitals filed for this visit.  Examination: General: Chronically ill-appearing gentleman sitting up in bed in no acute distress. HENT: Bruising around his right eye with mild edema.  Oral mucosa dry.  Mallampati 3. Lungs: Prolonged expiration and expiratory wheezing.  Rhonchi.  Breathing comfortably on room air. Cardiovascular: Regular rate and rhythm, no murmurs Abdomen: Soft, nontender, nondistended Extremities: No pitting edema or cyanosis Neuro: Awake and alert, moving all extremities spontaneously.  Speaking in full sentences.  Normal attentiveness.  No nystagmus. EOMI. Derm: No rashes  Resolved Hospital Problem list     Assessment & Plan:  Hyponatremia likely due to poor solute intake related to alcohol abuse -recheck BMP before additional fluids are given -goal to correct <40mEq over the next 24 hours; BMP Q4h -monitor for neuro changes in the ICU -NPO except meds  Hypokalemia, hypochloremia, hypomagnesemia -replete orally & IV -con't monitoring  Possible UGIB, history of Barrett's esophagus -PPI BID -type and screen -maintain 2+large bore PIVs -Repeat CBC this evening  ETOH intoxication; history of DTs during a past admission -CIWA -folate, MVI, thiamine  Chest pain- presume this is due to esophagitis -serial troponins -unfortunately due to concern of bleeding, unable to give AP or AC -Protonix twice daily  Tobacco abuse -counseled him on the importance of smoking -nicotine replacement therapy  Best practice:  Diet: NPO Pain/Anxiety/Delirium  protocol (if indicated): CIWA ordered VAP protocol (if indicated): n/a DVT prophylaxis: held due to concern for bleeding GI prophylaxis: PPI BID Glucose control: n/a Mobility: bedrest Code Status: full Family Communication: wants his mother to be his surrogate decision maker Christiane Ha [phone]) Disposition: ICU  Labs   CBC: Recent Labs  Lab 03/13/19 1248   WBC 16.6*  NEUTROABS 13.0*  HGB 13.0  HCT 37.0*  MCV 77.2*  PLT 595    Basic Metabolic Panel: Recent Labs  Lab 03/13/19 1248  NA 108*  K 2.8*  CL <65*  CO2 27  GLUCOSE 105*  BUN 7*  CREATININE 0.63  CALCIUM 7.7*   GFR: CrCl cannot be calculated (Unknown ideal weight.). Recent Labs  Lab 03/13/19 1248  WBC 16.6*    Liver Function Tests: Recent Labs  Lab 03/13/19 1248  AST 35  ALT 17  ALKPHOS 89  BILITOT 0.8  PROT 6.2*  ALBUMIN 3.4*   Recent Labs  Lab 03/13/19 1248  LIPASE 26   No results for input(s): AMMONIA in the last 168 hours.  ABG    Component Value Date/Time   HCO3 20.4 05/10/2018 0506   TCO2 21 (L) 05/10/2018 0506   ACIDBASEDEF 3.0 (H) 05/10/2018 0506   O2SAT 91.0 05/10/2018 0506     Coagulation Profile: No results for input(s): INR, PROTIME in the last 168 hours.  Cardiac Enzymes: No results for input(s): CKTOTAL, CKMB, CKMBINDEX, TROPONINI in the last 168 hours.  HbA1C: Hgb A1c MFr Bld  Date/Time Value Ref Range Status  06/26/2017 04:30 AM 5.4 4.8 - 5.6 % Final    Comment:    (NOTE)         Prediabetes: 5.7 - 6.4         Diabetes: >6.4         Glycemic control for adults with diabetes: <7.0   08/03/2016 06:58 AM 5.3 4.8 - 5.6 % Final    Comment:    (NOTE)         Pre-diabetes: 5.7 - 6.4         Diabetes: >6.4         Glycemic control for adults with diabetes: <7.0     CBG: No results for input(s): GLUCAP in the last 168 hours.  Review of Systems:   General: Denies fevers, chills, sweats.  HENT:No rhinitis or pharyngitis Eyes: no blurry vision or diplopia Resp: no dyspnea, + cough Cardiac: no edema, +CP GI: +dyspepsia & vomiting GU: no dysuria, hematuria Derm: no rashes Neuro: +fall, weakness Lymph: no adenopathy or bleeding   Past Medical History  He,  has a past medical history of Alcoholism ([place]), CAD (coronary artery disease) (2002), COPD (chronic obstructive pulmonary disease) ([place]), Depression, GERD  (gastroesophageal reflux disease), Headache, Hypertension, and Stroke ([place]) (2006).   Surgical History    Past Surgical History:  Procedure Laterality Date  . BACK SURGERY     3 cervical spine surgeries C4-C5 fused  . COLONOSCOPY N/A 01/04/2014   Procedure: COLONOSCOPY;  Surgeon: Danie Binder, MD;  Location: [facility];  Service: Endoscopy;  Laterality: N/A;  1:45  . ESOPHAGOGASTRODUODENOSCOPY N/A 01/04/2014   Procedure: ESOPHAGOGASTRODUODENOSCOPY (EGD);  Surgeon: Danie Binder, MD;  Location: [facility];  Service: Endoscopy;  Laterality: N/A;  . FINGER SURGERY Left    2nd, 3rd, & 4th fingers were cut off by table saw and reattached  . GASTRECTOMY    . HERNIA REPAIR    . INCISIONAL HERNIA REPAIR N/A 01/20/2014  Procedure: LAPAROSCOPIC RECURRENT  INCISIONAL HERNIA with mesh;  Surgeon: Edward Jolly, MD;  Location: [facility];  Service: General;  Laterality: N/A;  . rt knee arthroscopic surgery    . SHOULDER SURGERY Bilateral    3 surgeries on on left, 2 surgeries on right      Social History   reports that he has been smoking cigarettes. He started smoking about 52 years ago. He has a 52.00 pack-year smoking history. He has never used smokeless tobacco. He reports current alcohol use of about 15.0 standard drinks of alcohol per week. He reports that he does not use drugs.   Family History   His family history includes Asthma in his son; Cancer in his brother and father; Dementia in his mother; Spina bifida in his son; Stroke in his maternal grandmother. There is no history of Colon cancer.   Allergies Allergies  Allergen Reactions  . Bee Venom Anaphylaxis  . Penicillins Rash    Has patient had a PCN reaction causing immediate rash, facial/tongue/throat swelling, SOB or lightheadedness with hypotension: {Yes Has patient had a PCN reaction causing severe rash involving mucus membranes or skin necrosis: YES Has patient had a PCN reaction that required hospitalization Yes Has  patient had a PCN reaction occurring within the last 10 years: YES If all of the above answers are "NO", then may proceed with Cephalosporin use.   . Vancomycin Tinitus     Home Medications  Prior to Admission medications   Medication Sig Start Date End Date Taking? Authorizing Provider  albuterol (VENTOLIN HFA) 108 (90 Base) MCG/ACT inhaler Inhale 2 puffs into the lungs every 4 (four) hours as needed for wheezing or shortness of breath. 01/15/19   Ladell Pier, MD  diphenhydrAMINE (BENADRYL) 25 MG tablet Take 25 mg by mouth every 6 (six) hours as needed.    [provider]  ibuprofen (ADVIL) 400 MG tablet Take 400 mg by mouth every 6 (six) hours as needed.    [provider]  nicotine (NICODERM CQ - DOSED IN MG/24 HOURS) 21 mg/24hr patch Place 1 patch (21 mg total) onto the skin daily. 01/15/19   Ladell Pier, MD  pantoprazole (PROTONIX) 40 MG tablet Take 1 tablet (40 mg total) by mouth daily. For reflux 01/15/19   Ladell Pier, MD  permethrin Nancy Fetter) 5 % cream Apply to entire body once 12/29/18   Julianne Rice, MD  prazosin (MINIPRESS) 1 MG capsule Take 1 capsule (1 mg total) by mouth at bedtime. For nightmares 01/15/19   Ladell Pier, MD  predniSONE (DELTASONE) 20 MG tablet Take 1 tablet (20 mg total) by mouth daily. 12/31/18   Davonna Belling, MD  sertraline (ZOLOFT) 50 MG tablet Take 1 tablet (50 mg total) by mouth daily. For mood 01/15/19   Ladell Pier, MD  traZODone (DESYREL) 50 MG tablet Take 2 tablets (100 mg total) by mouth at bedtime and may repeat dose one time if needed. For sleep 01/15/19   Ladell Pier, MD    This patient is critically ill with multiple organ system failure which requires frequent high complexity decision making, assessment, support, evaluation, and titration of therapies. This was completed through the application of advanced monitoring technologies and extensive interpretation of multiple databases. During this  encounter critical care time was devoted to patient care services described in this note for 45 minutes.  Julian Hy, DO 03/13/19 6:27 PM [facility]

## 2019-03-13 NOTE — ED Notes (Signed)
Attempted report x 2 

## 2019-03-14 DIAGNOSIS — F10230 Alcohol dependence with withdrawal, uncomplicated: Secondary | ICD-10-CM

## 2019-03-14 LAB — CBC
HCT: 36 % — ABNORMAL LOW (ref 39.0–52.0)
Hemoglobin: 12.6 g/dL — ABNORMAL LOW (ref 13.0–17.0)
MCH: 27.3 pg (ref 26.0–34.0)
MCHC: 35 g/dL (ref 30.0–36.0)
MCV: 77.9 fL — ABNORMAL LOW (ref 80.0–100.0)
Platelets: 196 10*3/uL (ref 150–400)
RBC: 4.62 MIL/uL (ref 4.22–5.81)
RDW: 18.7 % — ABNORMAL HIGH (ref 11.5–15.5)
WBC: 15.1 10*3/uL — ABNORMAL HIGH (ref 4.0–10.5)
nRBC: 0 % (ref 0.0–0.2)

## 2019-03-14 LAB — BASIC METABOLIC PANEL
Anion gap: 18 — ABNORMAL HIGH (ref 5–15)
Anion gap: 16 — ABNORMAL HIGH (ref 5–15)
Anion gap: 16 — ABNORMAL HIGH (ref 5–15)
Anion gap: 16 — ABNORMAL HIGH (ref 5–15)
BUN: 7 mg/dL — ABNORMAL LOW (ref 8–23)
BUN: 6 mg/dL — ABNORMAL LOW (ref 8–23)
BUN: 6 mg/dL — ABNORMAL LOW (ref 8–23)
BUN: 5 mg/dL — ABNORMAL LOW (ref 8–23)
CO2: 26 mmol/L (ref 22–32)
CO2: 26 mmol/L (ref 22–32)
CO2: 25 mmol/L (ref 22–32)
CO2: 26 mmol/L (ref 22–32)
Calcium: 7.6 mg/dL — ABNORMAL LOW (ref 8.9–10.3)
Calcium: 7.8 mg/dL — ABNORMAL LOW (ref 8.9–10.3)
Calcium: 7.9 mg/dL — ABNORMAL LOW (ref 8.9–10.3)
Calcium: 7.9 mg/dL — ABNORMAL LOW (ref 8.9–10.3)
Chloride: 69 mmol/L — ABNORMAL LOW (ref 98–111)
Chloride: 73 mmol/L — ABNORMAL LOW (ref 98–111)
Chloride: 78 mmol/L — ABNORMAL LOW (ref 98–111)
Chloride: 82 mmol/L — ABNORMAL LOW (ref 98–111)
Creatinine, Ser: 0.79 mg/dL (ref 0.61–1.24)
Creatinine, Ser: 0.77 mg/dL (ref 0.61–1.24)
Creatinine, Ser: 0.76 mg/dL (ref 0.61–1.24)
Creatinine, Ser: 0.71 mg/dL (ref 0.61–1.24)
GFR calc Af Amer: >60 mL/min (ref >60)
GFR calc Af Amer: >60 mL/min (ref >60)
GFR calc Af Amer: >60 mL/min (ref >60)
GFR calc Af Amer: >60 mL/min (ref >60)
GFR calc non Af Amer: >60 mL/min (ref >60)
GFR calc non Af Amer: >60 mL/min (ref >60)
GFR calc non Af Amer: >60 mL/min (ref >60)
GFR calc non Af Amer: >60 mL/min (ref >60)
Glucose, Bld: 71 mg/dL (ref 70–99)
Glucose, Bld: 70 mg/dL (ref 70–99)
Glucose, Bld: 81 mg/dL (ref 70–99)
Glucose, Bld: 80 mg/dL (ref 70–99)
Potassium: 3.1 mmol/L — ABNORMAL LOW (ref 3.5–5.1)
Potassium: 4 mmol/L (ref 3.5–5.1)
Potassium: 3.8 mmol/L (ref 3.5–5.1)
Potassium: 3.1 mmol/L — ABNORMAL LOW (ref 3.5–5.1)
Sodium: 113 mmol/L — CL (ref 135–145)
Sodium: 115 mmol/L — CL (ref 135–145)
Sodium: 119 mmol/L — CL (ref 135–145)
Sodium: 124 mmol/L — ABNORMAL LOW (ref 135–145)

## 2019-03-14 LAB — PHOSPHORUS: Phosphorus: 2 mg/dL — ABNORMAL LOW (ref 2.5–4.6)

## 2019-03-14 LAB — MAGNESIUM: Magnesium: 2.2 mg/dL (ref 1.7–2.4)

## 2019-03-14 MED ORDER — ALBUTEROL SULFATE (2.5 MG/3ML) 0.083% IN NEBU
2.5000 mg | INHALATION_SOLUTION | RESPIRATORY_TRACT | Status: DC | PRN
  Administered 2019-03-14: 2.5 mg via RESPIRATORY_TRACT
  Filled 2019-03-14: qty 3

## 2019-03-14 MED ORDER — ONDANSETRON HCL 4 MG/2ML IJ SOLN
4.0000 mg | Freq: Four times a day (QID) | INTRAMUSCULAR | Status: DC | PRN
  Administered 2019-03-14: 4 mg via INTRAVENOUS

## 2019-03-14 MED ORDER — TRIMETHOBENZAMIDE HCL 100 MG/ML IM SOLN
200.0000 mg | Freq: Four times a day (QID) | INTRAMUSCULAR | Status: DC | PRN
  Administered 2019-03-14: 01:00:00 200 mg via INTRAMUSCULAR
  Filled 2019-03-14 (×2): qty 2

## 2019-03-14 MED ORDER — POTASSIUM CHLORIDE CRYS ER 20 MEQ PO TBCR: 20.0000 meq | EXTENDED_RELEASE_TABLET | Freq: Two times a day (BID) | ORAL | Status: DC

## 2019-03-14 MED ORDER — SODIUM PHOSPHATES 45 MMOLE/15ML IV SOLN
30.0000 mmol | Freq: Once | INTRAVENOUS | Status: AC
  Administered 2019-03-14: 30 mmol via INTRAVENOUS
  Filled 2019-03-14: qty 10

## 2019-03-14 MED ORDER — ALBUTEROL SULFATE (2.5 MG/3ML) 0.083% IN NEBU
2.5000 mg | INHALATION_SOLUTION | Freq: Three times a day (TID) | RESPIRATORY_TRACT | Status: DC
  Administered 2019-03-14 – 2019-03-19 (×15): 2.5 mg via RESPIRATORY_TRACT
  Filled 2019-03-14 (×17): qty 3

## 2019-03-14 MED ORDER — LORAZEPAM 2 MG/ML IJ SOLN
1.0000 mg | INTRAMUSCULAR | Status: DC | PRN
  Administered 2019-03-15 – 2019-03-16 (×4): 1 mg via INTRAVENOUS
  Administered 2019-03-16: 2 mg via INTRAVENOUS
  Filled 2019-03-14 (×5): qty 1

## 2019-03-14 MED ORDER — TRAZODONE HCL 100 MG PO TABS
100.0000 mg | ORAL_TABLET | Freq: Every evening | ORAL | Status: DC | PRN
  Administered 2019-03-14 – 2019-03-18 (×4): 100 mg via ORAL
  Filled 2019-03-14: qty 1
  Filled 2019-03-14: qty 2
  Filled 2019-03-14 (×2): qty 1
  Filled 2019-03-14: qty 2

## 2019-03-14 MED ORDER — POTASSIUM CHLORIDE 10 MEQ/100ML IV SOLN
10.0000 meq | INTRAVENOUS | Status: AC
  Administered 2019-03-14 (×6): 10 meq via INTRAVENOUS
  Filled 2019-03-14 (×7): qty 100

## 2019-03-14 MED ORDER — POTASSIUM CHLORIDE CRYS ER 20 MEQ PO TBCR
20.0000 meq | EXTENDED_RELEASE_TABLET | Freq: Once | ORAL | Status: AC
  Administered 2019-03-14: 20 meq via ORAL
  Filled 2019-03-14: qty 1

## 2019-03-14 NOTE — Progress Notes (Addendum)
Consult request has been received. CSW attempting to follow up at present time.  CSW called pt's room # and received no answer.  CSW called pt's listed home # at ph: [phone] and the phone was answered by [facility] in [facility]. CSW then called pt's RN and asked to speak to the pt.  CSW spoke to the pt explained the TOC CSW's role and asked pf if the CSW could assist with pt's issues for which the CSW was consulted.  Pt stated he was at the [facility] but has no bed there currently.  Pt provided verbal permission for the CSW to call and seek admission if a bed is available on the day of D/C.  Pt stated prior to admission he has "been staying her and there".  When asked his plan at D/C pt stated that, "Derek at the [facility]  is working on something for me".  When asked by the CSW for clarification pt stated "Vicente Males" at the [facility] was working on a solution for housing, but that the pt had no timeline for that solution coming into fruition.  CSW asked if CSW could assist pt with facilitating his Advanced Directives and explained what that was.  Pt  categorically refused stating he wanted no family members to be able to take part in decision-making transactions on the part of the pt if he were to be or were not to be judged not fully oriented.   Pt did provide permission for the CSW to contact his family members to inform them that the pt is in the hospital.  CSW will continue to follow for D/C needs.  Alphonse Guild. Aleks Nawrot, LCSW, LCAS, CSI Transitions of Care Clinical Social Worker Care Coordination Department Ph: [phone]

## 2019-03-14 NOTE — Progress Notes (Signed)
NAME:  Samuel Reyes, MRN:  [id number], DOB:  [birth year], LOS: 1 ADMISSION DATE:  03/13/2019, CONSULTATION DATE:  03/14/19 REFERRING MD:  Shelly Coss, CHIEF COMPLAINT:  Hyponatremia, AMS   Brief History   Mr. Varkey is a 63 y/o gentleman who presented after a fall, acutely intoxicated and hyponatremic.  History of present illness   Mr. Nunley is a 63 y/o gentleman who presents to the ED complaining of chest pain and nausea after having a fall at home. He drinks alcohol daily. He reports that the fall was due to losing his balance, and he hit the right side of his head when he fell. He has had intermittent chest pain for a few days. In the ED he was vomiting coffee ground emesis and was found to have a sodium of 108. He was given 1L NS. He has gone through DTs in previous admissions treated with PRN benzos.  Currently his only complaints are central chest burning, which he attributes to his reflux, and nausea, which is improved. He admits to feeling weak over the last few days.  He has been drinking about 30 beers a day for the last few days, and increased from his baseline of 12-15 beers per day.  He continues to smoke 1 pack/day.  He is  interested in attempting to quit.  He admits to not eating in the last few days.  Past Medical History  ETOH abuse CAD GERD, Barret's esophagus, h/o UGIB COPD CVA HTN UGIB MDD with suicide attempts & previous psychiatric hospitalizations  Significant Hospital Events     Consults:    Procedures:    Significant Diagnostic Tests:  CXR- no acute abnormalities EKG- NSR, prolonged QRS. No significant ST segment deviation or TWI Head CT- no bleeding or other acute abnormalities Neck CT- no fractures  Micro Data:  Sars CoV2-negative  Antimicrobials:    Interim history/subjective:   Complains of nausea Afebrile   Objective   Blood pressure (!) 154/74, pulse 82, temperature 98.2 F (36.8 C), temperature source Oral, resp. rate 20, weight 66.4  kg, SpO2 96 %.        Intake/Output Summary (Last 24 hours) at 03/14/2019 0751 Last data filed at 03/14/2019 0700 Gross per 24 hour  Intake 2243.57 ml  Output 1075 ml  Net 1168.57 ml   Filed Weights   03/14/19 0500  Weight: 66.4 kg    Examination: General: Chronically ill-appearing gentleman lying up in bed in no acute distress. HENT: Bruising around his right eye with mild edema.  Oral mucosa dry.  Mallampati 3. Lungs: Prolonged expiration , no Rhonchi.  No accessory muscle use Cardiovascular: Regular rate and rhythm, no murmurs Abdomen: Soft, nontender, nondistended Extremities: No pitting edema or cyanosis Neuro: Awake and alert, moving all extremities spontaneously.  Speaking in full sentences.  No tremors Derm: No rashes   EKG personally reviewed QTC 485 Resolved Hospital Problem list     Assessment & Plan:  Hyponatremia likely due to poor solute intake related to alcohol abuse -Has corrected from 108 to  115 over 15 hours which is very appropriate -Can correct to 125 over the next 24 hours -monitor for neuro changes and withdrawal symptoms in the ICU   Hypokalemia, hypochloremia, hypomagnesemia, hypophosphatemia -replete  IV -Recheck in a.m.  Possible UGIB, history of Barrett's esophagus, hemoglobin stable -PPI BID -type and screen --Protonix twice daily -Consider GI input once acute issues resolved  ETOH intoxication; history of DTs during a past admission -Ativan 1 to 2 mg every  2 hours as needed CIWA protocol -folate, MVI, thiamine  Chest pain- presume this is due to esophagitis, improving -serial troponins negative -unfortunately due to concern of bleeding, unable to give AP or AC   Tobacco abuse -Smoking cessation emphasized  Best practice:  Diet: NPO Pain/Anxiety/Delirium protocol (if indicated): CIWA ordered VAP protocol (if indicated): n/a DVT prophylaxis: held due to concern for bleeding GI prophylaxis: PPI BID Glucose control: n/a  Mobility: oob Code Status: full Family Communication: wants his mother to be his surrogate decision maker Christiane Ha [phone]) Disposition: ICU   The patient is critically ill with multiple organ systems failure and requires high complexity decision making for assessment and support, frequent evaluation and titration of therapies, application of advanced monitoring technologies and extensive interpretation of multiple databases. Critical Care Time devoted to patient care services described in this note independent of APP/resident  time is 31 minutes.     Kara Mead MD. Shade Flood. [facility] Pager [phone] If no response call [phone]   03/14/2019

## 2019-03-14 NOTE — Progress Notes (Signed)
   2nd shift ED CSW will leave handoff for 1st shift ED CSW.  CSW will continue to follow for D/C needs.  Alphonse Guild. Sravya Grissom, LCSW, LCAS, CSI Transitions of Care Clinical Social Worker Care Coordination Department Ph: [phone]

## 2019-03-14 NOTE — Progress Notes (Signed)
eLink Physician-Brief Progress Note Patient Name: Samuel Reyes DOB: 03/13/1956 MRN: [id number]   Date of Service  03/14/2019  HPI/Events of Note  Upon review of labs, I see sodium is 124  Last one before that was 115 Is on NS at 50 cc/hour NO symptoms, asking when he can eat  eICU Interventions  Will stop NS now K is 3.1, replete with 20 meq oral potassium Need to closely watch sodium and decide on fluids based on next draw Also watch for jump in sodium as potassium is beig repleted Asked RN to call us with next draw      Intervention Category Major Interventions: Electrolyte abnormality - evaluation and management  Margaretmary Lombard 03/14/2019, 8:17 PM

## 2019-03-14 NOTE — Progress Notes (Signed)
CSW provided the pt with the # for Shirlean Mylar with [facility] and counseled the pt on contacting Shirlean Mylar about seeking a hotel and then a shelter room once pt is discharged from the hospital.  Glen Campbell will continue to follow for D/C needs.  Alphonse Guild. Daison Braxton, LCSW, LCAS, CSI Transitions of Care Clinical Social Worker Care Coordination Department Ph: [phone]

## 2019-03-14 NOTE — Progress Notes (Signed)
CSW called pt's mother and updated her and provided her with pt's room # and counseled her on how to contact the pt..  CSW attempted to call pt's sister but her number was not a working number.  2nd shift ED CSW will leave handoff for 1st shift ED CSW.  CSW will continue to follow for D/C needs.  Alphonse Guild. Kjersti Dittmer, LCSW, LCAS, CSI Transitions of Care Clinical Social Worker Care Coordination Department Ph: [phone]

## 2019-03-14 NOTE — Progress Notes (Addendum)
Of note: Per a TOC CSW/CM email sent out recently which talked about one phone number for shelter referrals through [facility] ([place])  Per the email, [place] [facility] is requiring people to quarantine at a hotel before they can go to an open bed at a shelter, but they set that up as well.  Per [facility], the earlier we could call them, the better so it would be helpful so as to not wait until day of discharge.  They are not open on weekends, but the phone number provided is: [phone].  I spoke to [facility] can also be reached personally on her cell: [phone].    CSW called Rachael Pontillo at [phone] on her confidential VM and asked her to return this CSW's or the CSW on duty on Sunday 8/16 and also provided her with Faye Ramsay ([place] (who is covering [facility] ED on 8/16) in case communication is difficult regarding this pt and the pt's possible need for a shelter bed when and if D/C'd on Sunday 8/16.  RN updated.  CSW will continue to follow for D/C needs.  Alphonse Guild. Brennyn Haisley, LCSW, LCAS, CSI Transitions of Care Clinical Social Worker Care Coordination Department Ph: [phone]

## 2019-03-14 NOTE — Progress Notes (Signed)
CRITICAL VALUE ALERT  Critical Value:  Na 113  Date & Time Notified:  03/14/2019 2:48 AM  Provider Notified: Warren Lacy

## 2019-03-15 DIAGNOSIS — F1020 Alcohol dependence, uncomplicated: Secondary | ICD-10-CM

## 2019-03-15 DIAGNOSIS — J449 Chronic obstructive pulmonary disease, unspecified: Secondary | ICD-10-CM

## 2019-03-15 DIAGNOSIS — Z590 Homelessness: Secondary | ICD-10-CM

## 2019-03-15 DIAGNOSIS — Z8659 Personal history of other mental and behavioral disorders: Secondary | ICD-10-CM

## 2019-03-15 DIAGNOSIS — K219 Gastro-esophageal reflux disease without esophagitis: Secondary | ICD-10-CM

## 2019-03-15 DIAGNOSIS — F17200 Nicotine dependence, unspecified, uncomplicated: Secondary | ICD-10-CM

## 2019-03-15 LAB — BASIC METABOLIC PANEL
Anion gap: 10 (ref 5–15)
Anion gap: 10 (ref 5–15)
Anion gap: 11 (ref 5–15)
Anion gap: 12 (ref 5–15)
Anion gap: 12 (ref 5–15)
BUN: 5 mg/dL — ABNORMAL LOW (ref 8–23)
BUN: 5 mg/dL — ABNORMAL LOW (ref 8–23)
BUN: 6 mg/dL — ABNORMAL LOW (ref 8–23)
BUN: 9 mg/dL (ref 8–23)
BUN: <5 mg/dL — ABNORMAL LOW (ref 8–23)
CO2: 26 mmol/L (ref 22–32)
CO2: 27 mmol/L (ref 22–32)
CO2: 28 mmol/L (ref 22–32)
CO2: 30 mmol/L (ref 22–32)
CO2: 31 mmol/L (ref 22–32)
Calcium: 7.9 mg/dL — ABNORMAL LOW (ref 8.9–10.3)
Calcium: 8 mg/dL — ABNORMAL LOW (ref 8.9–10.3)
Calcium: 8.2 mg/dL — ABNORMAL LOW (ref 8.9–10.3)
Calcium: 8.4 mg/dL — ABNORMAL LOW (ref 8.9–10.3)
Calcium: 8.4 mg/dL — ABNORMAL LOW (ref 8.9–10.3)
Chloride: 83 mmol/L — ABNORMAL LOW (ref 98–111)
Chloride: 86 mmol/L — ABNORMAL LOW (ref 98–111)
Chloride: 87 mmol/L — ABNORMAL LOW (ref 98–111)
Chloride: 88 mmol/L — ABNORMAL LOW (ref 98–111)
Chloride: 88 mmol/L — ABNORMAL LOW (ref 98–111)
Creatinine, Ser: 0.71 mg/dL (ref 0.61–1.24)
Creatinine, Ser: 0.72 mg/dL (ref 0.61–1.24)
Creatinine, Ser: 0.74 mg/dL (ref 0.61–1.24)
Creatinine, Ser: 0.78 mg/dL (ref 0.61–1.24)
Creatinine, Ser: 0.92 mg/dL (ref 0.61–1.24)
GFR calc Af Amer: >60 mL/min (ref >60)
GFR calc Af Amer: >60 mL/min (ref >60)
GFR calc Af Amer: >60 mL/min (ref >60)
GFR calc Af Amer: >60 mL/min (ref >60)
GFR calc Af Amer: >60 mL/min (ref >60)
GFR calc non Af Amer: >60 mL/min (ref >60)
GFR calc non Af Amer: >60 mL/min (ref >60)
GFR calc non Af Amer: >60 mL/min (ref >60)
GFR calc non Af Amer: >60 mL/min (ref >60)
GFR calc non Af Amer: >60 mL/min (ref >60)
Glucose, Bld: 113 mg/dL — ABNORMAL HIGH (ref 70–99)
Glucose, Bld: 116 mg/dL — ABNORMAL HIGH (ref 70–99)
Glucose, Bld: 119 mg/dL — ABNORMAL HIGH (ref 70–99)
Glucose, Bld: 121 mg/dL — ABNORMAL HIGH (ref 70–99)
Glucose, Bld: 95 mg/dL (ref 70–99)
Potassium: 2.9 mmol/L — ABNORMAL LOW (ref 3.5–5.1)
Potassium: 3.5 mmol/L (ref 3.5–5.1)
Potassium: 3.6 mmol/L (ref 3.5–5.1)
Potassium: 3.7 mmol/L (ref 3.5–5.1)
Potassium: 4 mmol/L (ref 3.5–5.1)
Sodium: 125 mmol/L — ABNORMAL LOW (ref 135–145)
Sodium: 125 mmol/L — ABNORMAL LOW (ref 135–145)
Sodium: 126 mmol/L — ABNORMAL LOW (ref 135–145)
Sodium: 126 mmol/L — ABNORMAL LOW (ref 135–145)
Sodium: 127 mmol/L — ABNORMAL LOW (ref 135–145)

## 2019-03-15 LAB — CBC
HCT: 38.6 % — ABNORMAL LOW (ref 39.0–52.0)
Hemoglobin: 12.8 g/dL — ABNORMAL LOW (ref 13.0–17.0)
MCH: 26.9 pg (ref 26.0–34.0)
MCHC: 33.2 g/dL (ref 30.0–36.0)
MCV: 81.3 fL (ref 80.0–100.0)
Platelets: 204 10*3/uL (ref 150–400)
RBC: 4.75 MIL/uL (ref 4.22–5.81)
RDW: 20 % — ABNORMAL HIGH (ref 11.5–15.5)
WBC: 10.5 10*3/uL (ref 4.0–10.5)
nRBC: 0 % (ref 0.0–0.2)

## 2019-03-15 LAB — SODIUM
Sodium: 122 mmol/L — ABNORMAL LOW (ref 135–145)
Sodium: 125 mmol/L — ABNORMAL LOW (ref 135–145)

## 2019-03-15 LAB — PHOSPHORUS: Phosphorus: 2.7 mg/dL (ref 2.5–4.6)

## 2019-03-15 LAB — MAGNESIUM: Magnesium: 2.3 mg/dL (ref 1.7–2.4)

## 2019-03-15 MED ORDER — SERTRALINE HCL 50 MG PO TABS
50.0000 mg | ORAL_TABLET | Freq: Every day | ORAL | Status: DC
  Administered 2019-03-15 – 2019-03-19 (×5): 50 mg via ORAL
  Filled 2019-03-15 (×5): qty 1

## 2019-03-15 MED ORDER — POTASSIUM CHLORIDE CRYS ER 20 MEQ PO TBCR
40.0000 meq | EXTENDED_RELEASE_TABLET | Freq: Once | ORAL | Status: AC
  Administered 2019-03-15: 40 meq via ORAL
  Filled 2019-03-15: qty 2

## 2019-03-15 MED ORDER — DEXTROSE 5 % IV SOLN: INTRAVENOUS | Status: DC

## 2019-03-15 MED ORDER — SODIUM CHLORIDE 0.45 % IV SOLN: INTRAVENOUS | Status: DC

## 2019-03-15 MED ORDER — ACETAMINOPHEN 325 MG PO TABS
650.0000 mg | ORAL_TABLET | Freq: Four times a day (QID) | ORAL | Status: DC | PRN
  Administered 2019-03-15 – 2019-03-18 (×3): 650 mg via ORAL
  Filled 2019-03-15 (×3): qty 2

## 2019-03-15 MED ORDER — DESMOPRESSIN ACETATE 4 MCG/ML IJ SOLN
1.0000 ug | Freq: Once | INTRAMUSCULAR | Status: AC
  Administered 2019-03-15: 07:00:00 1 ug via INTRAVENOUS
  Filled 2019-03-15: qty 1

## 2019-03-15 MED ORDER — DEXTROSE 5 % IV SOLN
INTRAVENOUS | Status: DC
  Administered 2019-03-15: 02:00:00 via INTRAVENOUS

## 2019-03-15 MED ORDER — SODIUM CHLORIDE 0.9 % IV SOLN: INTRAVENOUS | Status: DC | PRN

## 2019-03-15 NOTE — Progress Notes (Signed)
eLink Physician-Brief Progress Note Patient Name: Jahred Tatar DOB: 1956-05-23 MRN: [id number]   Date of Service  03/15/2019  HPI/Events of Note  Sodium 127  eICU Interventions  When I called to check on 5 am labs, I noted that there was a sodium of 127 from 2 am (was not notified before) AM labs pending  Asked that AM BMP be sent now  Increase d5 to 100 cc/hour Checked with RN - he has made 1.5 liter urine overnight One dose of DDAVP to be given Check BMP q4h for now Needs close monitoring of sodium- please notify MD when the labs result      Intervention Category Major Interventions: Electrolyte abnormality - evaluation and management  Margaretmary Lombard 03/15/2019, 6:38 AM

## 2019-03-15 NOTE — Progress Notes (Signed)
PROGRESS NOTE  Samuel Reyes FMB:[id number] DOB: 06-11-1956   PCP: [name], NP  Patient is from: Homeless  DOA: 03/13/2019 LOS: 2  Brief Narrative / Interim history: 63 year old male with history of CAD, COPD not on oxygen, CVA, HTN, MDD with suicidal attempt and previous psych hospitalization, GERD, U GIB, tobacco use disorder and alcohol use disorder presenting with chest and epigastric pain, nausea fall at home after alcohol intoxication and admitted to ICU for severe hyponatremia to 108.  Hyponatremia improved.  TRH assumed care on 03/15/2019.  Subjective: No major events overnight of this morning.  Has no complaints.  Feels a little bit shaky. He endorses chronic headache.  He also noted dark spots from his left eye since he had cataract surgery 2 weeks ago that has not changed.  Denies focal neuro symptoms.  Denies chest pain, dyspnea, GI or GU symptoms.  Denies suicidal ideation and audiovisual hallucination.  Objective: Vitals:   03/15/19 0700 03/15/19 0730 03/15/19 0800 03/15/19 0816  BP:   137/75 137/75  Pulse: 80  82 84  Resp: (!) 24  17 20   Temp:  98.6 F (37 C)    TempSrc:  Oral    SpO2: 98%  100% 99%  Weight:        Intake/Output Summary (Last 24 hours) at 03/15/2019 1334 Last data filed at 03/15/2019 0645 Gross per 24 hour  Intake 1530.47 ml  Output 2675 ml  Net -1144.53 ml   Filed Weights   03/14/19 0500 03/15/19 0409  Weight: 66.4 kg 64.1 kg    Examination:  GENERAL: No acute distress.  Sitting in bed eating breakfast. HEENT: MMM.  Vision and hearing grossly intact.  Bruise over right forehead lateral to his eye. NECK: Supple.  No apparent JVD.  RESP:  No IWOB.  Fair aeration despite poor effort. CVS:  RRR. Heart sounds normal.  ABD/GI/GU: Bowel sounds present. Soft. Non tender.  MSK/EXT:  Moves extremities. No apparent deformity or edema.  SKIN: no apparent skin lesion or wound NEURO: Awake, alert and oriented appropriately.  No gross  deficit.  PSYCH: Calm. Normal affect.  Denies SI or HI.   I have personally reviewed the following labs and images:  Radiology Studies: No results found.  Microbiology: Recent Results (from the past 240 hour(s))  SARS Coronavirus 2 [facility] order, Performed in [facility] hospital lab) Nasopharyngeal Nasopharyngeal Swab     Status: None   Collection Time: 03/13/19  3:50 PM   Specimen: Nasopharyngeal Swab  Result Value Ref Range Status   SARS Coronavirus 2 NEGATIVE NEGATIVE Final    Comment: (NOTE) If result is NEGATIVE SARS-CoV-2 target nucleic acids are NOT DETECTED. The SARS-CoV-2 RNA is generally detectable in upper and lower  respiratory specimens during the acute phase of infection. The lowest  concentration of SARS-CoV-2 viral copies this assay can detect is 250  copies / mL. A negative result does not preclude SARS-CoV-2 infection  and should not be used as the sole basis for treatment or other  patient management decisions.  A negative result may occur with  improper specimen collection / handling, submission of specimen other  than nasopharyngeal swab, presence of viral mutation(s) within the  areas targeted by this assay, and inadequate number of viral copies  (<250 copies / mL). A negative result must be combined with clinical  observations, patient history, and epidemiological information. If result is POSITIVE SARS-CoV-2 target nucleic acids are DETECTED. The SARS-CoV-2 RNA is generally detectable in upper and lower  respiratory specimens dur ing the acute phase of infection.  Positive  results are indicative of active infection with SARS-CoV-2.  Clinical  correlation with patient history and other diagnostic information is  necessary to determine patient infection status.  Positive results do  not rule out bacterial infection or co-infection with other viruses. If result is PRESUMPTIVE POSTIVE SARS-CoV-2 nucleic acids MAY BE PRESENT.   A presumptive positive  result was obtained on the submitted specimen  and confirmed on repeat testing.  While 2019 novel coronavirus  (SARS-CoV-2) nucleic acids may be present in the submitted sample  additional confirmatory testing may be necessary for epidemiological  and / or clinical management purposes  to differentiate between  SARS-CoV-2 and other Sarbecovirus currently known to infect humans.  If clinically indicated additional testing with an alternate test  methodology [phone]) is advised. The SARS-CoV-2 RNA is generally  detectable in upper and lower respiratory sp ecimens during the acute  phase of infection. The expected result is Negative. Fact Sheet for Patients:  StrictlyIdeas.no Fact Sheet for Healthcare Providers: BankingDealers.co.za This test is not yet approved or cleared by the [place] FDA and has been authorized for detection and/or diagnosis of SARS-CoV-2 by FDA under an Emergency Use Authorization (EUA).  This EUA will remain in effect (meaning this test can be used) for the duration of the COVID-19 declaration under Section 564(b)(1) of the Act, 21 U.S.C. section 360bbb-3(b)(1), unless the authorization is terminated or revoked sooner. Performed at [facility], [place] [address] [place],  [address]     Sepsis Labs: Invalid input(s): PROCALCITONIN, LACTICIDVEN  Urine analysis:    Component Value Date/Time   COLORURINE YELLOW 12/31/2018 1055   APPEARANCEUR CLEAR 12/31/2018 1055   LABSPEC 1.020 12/31/2018 1055   PHURINE 6.0 12/31/2018 1055   GLUCOSEU NEGATIVE 12/31/2018 1055   HGBUR NEGATIVE 12/31/2018 1055   BILIRUBINUR NEGATIVE 12/31/2018 1055   KETONESUR NEGATIVE 12/31/2018 1055   PROTEINUR NEGATIVE 12/31/2018 1055   UROBILINOGEN 0.2 05/31/2015 2156   NITRITE NEGATIVE 12/31/2018 1055   LEUKOCYTESUR NEGATIVE 12/31/2018 1055    Anemia Panel: No results for input(s): VITAMINB12, FOLATE, FERRITIN, TIBC,  IRON, RETICCTPCT in the last 72 hours.  Thyroid Function Tests: No results for input(s): TSH, T4TOTAL, FREET4, T3FREE, THYROIDAB in the last 72 hours.  Lipid Profile: No results for input(s): CHOL, HDL, LDLCALC, TRIG, CHOLHDL, LDLDIRECT in the last 72 hours.  CBG: Recent Labs  Lab 03/13/19 1940  GLUCAP 87    HbA1C: No results for input(s): HGBA1C in the last 72 hours.  BNP (last 3 results): No results for input(s): PROBNP in the last 8760 hours.  Cardiac Enzymes: No results for input(s): CKTOTAL, CKMB, CKMBINDEX, TROPONINI in the last 168 hours.  Coagulation Profile: Recent Labs  Lab 03/13/19 1818  INR 0.9    Liver Function Tests: Recent Labs  Lab 03/13/19 1248  AST 35  ALT 17  ALKPHOS 89  BILITOT 0.8  PROT 6.2*  ALBUMIN 3.4*   Recent Labs  Lab 03/13/19 1248  LIPASE 26   No results for input(s): AMMONIA in the last 168 hours.  Basic Metabolic Panel: Recent Labs  Lab 03/13/19 2127  03/14/19 0634  03/14/19 1814 03/15/19 0031 03/15/19 0219 03/15/19 0643 03/15/19 1032  NA 112*   < > 115*   < > 124* 125* 127* 126* 126*  K 3.0*   < > 4.0   < > 3.1* 2.9* 3.5 3.6 4.0  CL 68*   < > 73*   < >  82* 83* 86* 88* 88*  CO2 26   < > 26   < > 26 30 31 28 26   GLUCOSE 78   < > 70   < > 80 116* 119* 121* 113*  BUN 6*   < > 6*   < > 5* 5* 5* <5* 6*  CREATININE 0.61   < > 0.77   < > 0.71 0.78 0.71 0.72 0.74  CALCIUM 7.3*   < > 7.8*   < > 7.9* 7.9* 8.2* 8.0* 8.4*  MG 2.2  --  2.2  --   --   --   --  2.3  --   PHOS 2.1*  --  2.0*  --   --   --   --  2.7  --    < > = values in this interval not displayed.   GFR: Estimated Creatinine Clearance: 82.2 mL/min (by C-G formula based on SCr of 0.74 mg/dL).  CBC: Recent Labs  Lab 03/13/19 1248 03/13/19 2127 03/14/19 0634 03/15/19 0643  WBC 16.6* 16.0* 15.1* 10.5  NEUTROABS 13.0*  --   --   --   HGB 13.0 12.4* 12.6* 12.8*  HCT 37.0* 34.2* 36.0* 38.6*  MCV 77.2* 76.0* 77.9* 81.3  PLT 253 210 196 204    Procedures:   None  Microbiology summarized: SARS-CoV-2 negative.  Assessment & Plan: Severe hyponatremia: Likely due to poor intake related to alcohol abuse. -Na 108 (admit)>>> 126.  -Some concern about overcorrection overnight.  He was given desmopressin and started on dextrose. -Discontinue dextrose now. -Serum sodium check every 8 hours -Monitor for neuro changes.  Hypokalemia/hypomagnesemia/hypophosphatemia: Due to alcohol. -Replenish and recheck  EtOH intoxication/use disorder with severe dependence: History of DT during past admission. -Continue CIWA protocol with Ativan. -Continue folate, multivitamin and thiamine's.  Atypical chest pain/epigastric pain/history of GERD/history of upper GI bleed: On NSAID at home. -Recommended avoiding NSAIDs. -Serial troponins negative.  EKG reassuring. -H&H stable. -Continue PPI twice daily  Tobacco use disorder: Smokes about a pack a day. -Cessation counseling -Nicotine patch  History of CAD: Stable.  Not on medication.  Serial troponin negative.  EKG without acute ischemic finding. -Continue monitoring  Chronic COPD: Stable.  On albuterol at home.  Not on oxygen. -PRN albuterol  History of MDD/SI: Stable.  Denies SI or audiovisual hallucination. -Continue home Zoloft -Continue trazodone nightly.  Borderline QT: QTC 485 -Minimize QTC prolonging drugs. -Monitor with intermittent EKG. -Continue telemetry -Replenish electrolytes aggressively.  History of CVA: Stable -Continue home meds  Social issue/homelessness -Appreciate CSW help  Chronic headache:  -PRN Tylenol.  DVT prophylaxis: SCD Code Status: Full code Family Communication: Patient has no family members. Disposition Plan: Remains inpatient.  High risk for EtOH withdrawal/DT.  Transfer to telemetry.  Consultants: PCCM   Antimicrobials: Anti-infectives (From admission, onward)   None      Sch Meds:  Scheduled Meds: . albuterol  2.5 mg Nebulization TID  . folic  acid  1 mg Oral Daily  . multivitamin with minerals  1 tablet Oral Daily  . nicotine  14 mg Transdermal Daily  . pantoprazole (PROTONIX) IV  40 mg Intravenous Q12H  . thiamine  100 mg Oral Daily   Continuous Infusions: . sodium chloride Stopped (03/14/19 2015)   PRN Meds:.sodium chloride, albuterol, LORazepam, ondansetron (ZOFRAN) IV, traZODone  35 minutes with more than 50% spent in reviewing records, counseling patient and coordinating care.  Taye T. Gallitzin  If 7PM-7AM, please contact night-coverage [email or web address]  Password [email or web address] 03/15/2019, 1:34 PM

## 2019-03-15 NOTE — Progress Notes (Signed)
eLink Physician-Brief Progress Note Patient Name: Samuel Reyes DOB: 1956-01-29 MRN: [id number]   Date of Service  03/15/2019  HPI/Events of Note  Labs show sodium of 125 , even though NS has been off Potassium still low at 2.9  eICU Interventions  Mental status is fine but this is a slight overcorrection from desired goal Will try to make sure no further rise occurs Start d5 at low rate  Replete K  Next check at 5 am and then 9 am Please call with results from 5 am lab draw     Intervention Category Major Interventions: Electrolyte abnormality - evaluation and management  Maliaka Brasington G Erionna Strum 03/15/2019, 1:18 AM

## 2019-03-16 LAB — PHOSPHORUS: Phosphorus: 2.9 mg/dL (ref 2.5–4.6)

## 2019-03-16 LAB — CBC
HCT: 37.1 % — ABNORMAL LOW (ref 39.0–52.0)
Hemoglobin: 12.3 g/dL — ABNORMAL LOW (ref 13.0–17.0)
MCH: 27.2 pg (ref 26.0–34.0)
MCHC: 33.2 g/dL (ref 30.0–36.0)
MCV: 82.1 fL (ref 80.0–100.0)
Platelets: 180 10*3/uL (ref 150–400)
RBC: 4.52 MIL/uL (ref 4.22–5.81)
RDW: 20.2 % — ABNORMAL HIGH (ref 11.5–15.5)
WBC: 11 10*3/uL — ABNORMAL HIGH (ref 4.0–10.5)
nRBC: 0 % (ref 0.0–0.2)

## 2019-03-16 LAB — COMPREHENSIVE METABOLIC PANEL
ALT: 17 U/L (ref 0–44)
AST: 18 U/L (ref 15–41)
Albumin: 2.8 g/dL — ABNORMAL LOW (ref 3.5–5.0)
Alkaline Phosphatase: 81 U/L (ref 38–126)
Anion gap: 12 (ref 5–15)
BUN: 9 mg/dL (ref 8–23)
CO2: 26 mmol/L (ref 22–32)
Calcium: 8.3 mg/dL — ABNORMAL LOW (ref 8.9–10.3)
Chloride: 91 mmol/L — ABNORMAL LOW (ref 98–111)
Creatinine, Ser: 0.74 mg/dL (ref 0.61–1.24)
GFR calc Af Amer: >60 mL/min (ref >60)
GFR calc non Af Amer: >60 mL/min (ref >60)
Glucose, Bld: 114 mg/dL — ABNORMAL HIGH (ref 70–99)
Potassium: 3.4 mmol/L — ABNORMAL LOW (ref 3.5–5.1)
Sodium: 129 mmol/L — ABNORMAL LOW (ref 135–145)
Total Bilirubin: 0.5 mg/dL (ref 0.3–1.2)
Total Protein: 5.7 g/dL — ABNORMAL LOW (ref 6.5–8.1)

## 2019-03-16 LAB — MAGNESIUM: Magnesium: 1.9 mg/dL (ref 1.7–2.4)

## 2019-03-16 MED ORDER — POTASSIUM CHLORIDE CRYS ER 20 MEQ PO TBCR
40.0000 meq | EXTENDED_RELEASE_TABLET | Freq: Once | ORAL | Status: AC
  Administered 2019-03-16: 40 meq via ORAL
  Filled 2019-03-16: qty 2

## 2019-03-16 MED ORDER — PANTOPRAZOLE SODIUM 40 MG PO TBEC
40.0000 mg | DELAYED_RELEASE_TABLET | Freq: Two times a day (BID) | ORAL | Status: DC
  Administered 2019-03-16 – 2019-03-19 (×6): 40 mg via ORAL
  Filled 2019-03-16 (×6): qty 1

## 2019-03-16 MED ORDER — SODIUM CHLORIDE 0.9 % IV SOLN: INTRAVENOUS | Status: DC | PRN

## 2019-03-16 MED ORDER — SODIUM CHLORIDE 0.9 % IV SOLN
INTRAVENOUS | Status: DC
  Administered 2019-03-16 – 2019-03-18 (×4): via INTRAVENOUS

## 2019-03-16 MED ORDER — CHLORHEXIDINE GLUCONATE CLOTH 2 % EX PADS: 6.0000 | MEDICATED_PAD | Freq: Every day | CUTANEOUS | Status: DC

## 2019-03-16 NOTE — Evaluation (Signed)
Physical Therapy Evaluation Patient Details Name: Samuel Reyes MRN: [id number] DOB: June 15, 1956 Today's Date: 03/16/2019   History of Present Illness  Pt adm with fall, acute intoxication, and hyponatremia. PMH - copd, etoh abuse, cad, tobacco use  Clinical Impression  Pt presents to PT with slightly unsteady gait. Expect pt will make fast progress toward return to his baseline. Pt is homeless and will go to a hotel prior to returning to shelter.     Follow Up Recommendations No PT follow up    Equipment Recommendations  Other (comment)(rollator)    Recommendations for Other Services       Precautions / Restrictions Precautions Precautions: Fall Restrictions Weight Bearing Restrictions: No      Mobility  Bed Mobility Overal bed mobility: Needs Assistance Bed Mobility: Supine to Sit     Supine to sit: Supervision;HOB elevated     General bed mobility comments: supervision for lines/safety  Transfers Overall transfer level: Needs assistance Equipment used: 4-wheeled walker Transfers: Sit to/from Stand Sit to Stand: Min guard         General transfer comment: Assist for safety and lines  Ambulation/Gait Ambulation/Gait assistance: Min assist Gait Distance (Feet): 170 Feet Assistive device: 4-wheeled walker Gait Pattern/deviations: Step-through pattern;Decreased stride length;Drifts right/left Gait velocity: decr Gait velocity interpretation: 1.31 - 2.62 ft/sec, indicative of limited community ambulator General Gait Details: Slightly unsteady gait requiring assist for balance. Amb on RA with SpO2 >92%.  Stairs            Wheelchair Mobility    Modified Rankin (Stroke Patients Only)       Balance Overall balance assessment: Needs assistance Sitting-balance support: No upper extremity supported;Feet supported Sitting balance-Leahy Scale: Fair     Standing balance support: No upper extremity supported Standing balance-Leahy Scale: Fair                                Pertinent Vitals/Pain Pain Assessment: No/denies pain    Home Living Family/patient expects to be discharged to:: Shelter/Homeless                 Additional Comments: Used to have a rollator but it was taken    Prior Function Level of Independence: Independent with assistive device(s)         Comments: Reports history of using rollator but rollator got taken     Hand Dominance   Dominant Hand: Right    Extremity/Trunk Assessment   Upper Extremity Assessment Upper Extremity Assessment: Generalized weakness    Lower Extremity Assessment Lower Extremity Assessment: Generalized weakness       Communication   Communication: No difficulties  Cognition Arousal/Alertness: Awake/alert Behavior During Therapy: WFL for tasks assessed/performed Overall Cognitive Status: Within Functional Limits for tasks assessed                                        General Comments      Exercises     Assessment/Plan    PT Assessment Patient needs continued PT services  PT Problem List Decreased strength;Decreased activity tolerance;Decreased balance;Decreased mobility       PT Treatment Interventions DME instruction;Gait training;Functional mobility training;Therapeutic activities;Balance training;Therapeutic exercise;Patient/family education    PT Goals (Current goals can be found in the Care Plan section)  Acute Rehab PT Goals Patient Stated Goal: get another rollator PT Goal  Formulation: With patient Time For Goal Achievement: 03/30/19 Potential to Achieve Goals: Good    Frequency Min 3X/week   Barriers to discharge Decreased caregiver support homeless    Co-evaluation               AM-PAC PT "6 Clicks" Mobility  Outcome Measure Help needed turning from your back to your side while in a flat bed without using bedrails?: A Little Help needed moving from lying on your back to sitting on the side of a  flat bed without using bedrails?: A Little Help needed moving to and from a bed to a chair (including a wheelchair)?: A Little Help needed standing up from a chair using your arms (e.g., wheelchair or bedside chair)?: A Little Help needed to walk in hospital room?: A Little Help needed climbing 3-5 steps with a railing? : A Little 6 Click Score: 18    End of Session Equipment Utilized During Treatment: Gait belt Activity Tolerance: Patient tolerated treatment well Patient left: in chair;with call bell/phone within reach;with chair alarm set Nurse Communication: Mobility status PT Visit Diagnosis: Unsteadiness on feet (R26.81)    Time: 4854-6270 PT Time Calculation (min) (ACUTE ONLY): 19 min   Charges:   PT Evaluation $PT Eval Moderate Complexity: 1 Mod          [place] Pager [phone] Office [place] 03/16/2019, 2:32 PM

## 2019-03-16 NOTE — Progress Notes (Signed)
Patient ID: Samuel Reyes, male   DOB: Aug 31, 1955, 63 y.o.   MRN: [id number]  PROGRESS NOTE    Samuel Reyes  VVO:[id number] DOB: 1956-07-25 DOA: 03/13/2019 PCP: [name], NP   Brief Narrative:  63 year old male with history of CAD, COPD not on oxygen, unspecified type of CVA, hypertension, hypertension, MDD with suicidal attempt and previous psych hospitalization, GERD, upper GI bleed, tobacco use disorder and alcohol use disorder presented with chest and epigastric pain with nausea and fall after alcohol intoxication.  He was admitted to ICU for severe hyponatremia with sodium of 108.  He was treated with IV fluids.  After hyponatremia improved, he was transferred to [facility] care on 03/15/2019.  Assessment & Plan:   Severe hyponatremia: Likely due to poor intake related to alcohol abuse. -Presented with sodium of 108 and admitted to ICU and treated with IV fluids. -Sodium level is 129 today. -Oral intake is still not that great.  We will treat with normal saline at 100 cc an hour.  Repeat a.m. sodium.  Hypokalemia -Replace.  Repeat a.m. labs  Alcohol intoxication/alcohol use disorder with severe dependence: History of DT during past admission. -Continue CIWA protocol with Ativan. -Continue folate, multivitamin and thiamine -Social worker consult for the same. -Counseled regarding alcohol abstinence.  Atypical chest pain/epigastric pain/history of GERD/history of upper GI bleed: On NSAID at home. -Recommended avoiding NSAIDs. -Serial troponins negative.  EKG reassuring. -H&H stable. -Continue PPI twice daily.  We will switch to oral.  Tobacco use disorder: Smokes about a pack a day. -Cessation counseling -Nicotine patch  History of CAD: Stable.  Not on medication.  Serial troponin negative.  EKG without acute ischemic finding. -Continue monitoring.  Outpatient follow-up  Chronic COPD: Stable.  On albuterol at home.  Not on oxygen. -PRN albuterol  History of  MDD/suicidal ideation: Stable.  Denies suicidal ideation or audiovisual hallucination. -Continue home Zoloft -Continue trazodone nightly. -Outpatient psychiatry evaluation.  Borderline QT: QTC 485 -Minimize QTC prolonging drugs. -Repeat EKG in a.m.  History of CVA: Stable -Continue home meds  Social issue/homelessness -Appreciate CSW help  Chronic headache:  -PRN Tylenol.  Generalized deconditioning -PT eval.    DVT prophylaxis: SCD.  Start Lovenox. Code Status: Full code Family Communication: Patient has no family members. Disposition Plan:  Discharge in 1 to 2 days if remains clinically stable and keeps improving Consultants: PCCM  Procedures: None  Antimicrobials: None   Subjective: Patient seen and examined at bedside.  He is sleepy, wakes up only very slightly on calling his name.  Does not answer much questions.  Very poor historian.  No overnight fever or vomiting reported by nursing staff.  Objective: Vitals:   03/16/19 0710 03/16/19 0750 03/16/19 0800 03/16/19 0900  BP:   124/73   Pulse:   82 93  Resp:   (!) 21 (!) 28  Temp:  98.4 F (36.9 C)    TempSrc:  Oral    SpO2: 96%  97% 99%  Weight:        Intake/Output Summary (Last 24 hours) at 03/16/2019 0941 Last data filed at 03/16/2019 0926 Gross per 24 hour  Intake 440 ml  Output 1600 ml  Net -1160 ml   Filed Weights   03/14/19 0500 03/15/19 0409 03/16/19 0500  Weight: 66.4 kg 64.1 kg 65.4 kg    Examination:  General exam: Appears calm and comfortable.  Sleepy, wakes up slightly, does not answer much questions. Respiratory system: Bilateral decreased breath sounds at bases.  Intermittently tachypneic  Cardiovascular system: S1 & S2 heard, Rate controlled Gastrointestinal system: Abdomen is nondistended, soft and nontender. Normal bowel sounds heard. Extremities: No cyanosis, clubbing, edema   Data Reviewed: I have personally reviewed following labs and imaging studies  CBC: Recent Labs   Lab 03/13/19 1248 03/13/19 2127 03/14/19 0634 03/15/19 0643 03/16/19 0422  WBC 16.6* 16.0* 15.1* 10.5 11.0*  NEUTROABS 13.0*  --   --   --   --   HGB 13.0 12.4* 12.6* 12.8* 12.3*  HCT 37.0* 34.2* 36.0* 38.6* 37.1*  MCV 77.2* 76.0* 77.9* 81.3 82.1  PLT 253 210 196 204 259   Basic Metabolic Panel: Recent Labs  Lab 03/13/19 2127  03/14/19 0634  03/15/19 0219 03/15/19 0643 03/15/19 1032 03/15/19 1430 03/15/19 1641 03/15/19 2233 03/16/19 0422  NA 112*   < > 115*   < > 127* 126* 126* 125* 122* 125* 129*  K 3.0*   < > 4.0   < > 3.5 3.6 4.0 3.7  --   --  3.4*  CL 68*   < > 73*   < > 86* 88* 88* 87*  --   --  91*  CO2 26   < > 26   < > 31 28 26 27   --   --  26  GLUCOSE 78   < > 70   < > 119* 121* 113* 95  --   --  114*  BUN 6*   < > 6*   < > 5* <5* 6* 9  --   --  9  CREATININE 0.61   < > 0.77   < > 0.71 0.72 0.74 0.92  --   --  0.74  CALCIUM 7.3*   < > 7.8*   < > 8.2* 8.0* 8.4* 8.4*  --   --  8.3*  MG 2.2  --  2.2  --   --  2.3  --   --   --   --  1.9  PHOS 2.1*  --  2.0*  --   --  2.7  --   --   --   --  2.9   < > = values in this interval not displayed.   GFR: Estimated Creatinine Clearance: 82.2 mL/min (by C-G formula based on SCr of 0.74 mg/dL). Liver Function Tests: Recent Labs  Lab 03/13/19 1248 03/16/19 0422  AST 35 18  ALT 17 17  ALKPHOS 89 81  BILITOT 0.8 0.5  PROT 6.2* 5.7*  ALBUMIN 3.4* 2.8*   Recent Labs  Lab 03/13/19 1248  LIPASE 26   No results for input(s): AMMONIA in the last 168 hours. Coagulation Profile: Recent Labs  Lab 03/13/19 1818  INR 0.9   Cardiac Enzymes: No results for input(s): CKTOTAL, CKMB, CKMBINDEX, TROPONINI in the last 168 hours. BNP (last 3 results) No results for input(s): PROBNP in the last 8760 hours. HbA1C: No results for input(s): HGBA1C in the last 72 hours. CBG: Recent Labs  Lab 03/13/19 1940  GLUCAP 87   Lipid Profile: No results for input(s): CHOL, HDL, LDLCALC, TRIG, CHOLHDL, LDLDIRECT in the last 72  hours. Thyroid Function Tests: No results for input(s): TSH, T4TOTAL, FREET4, T3FREE, THYROIDAB in the last 72 hours. Anemia Panel: No results for input(s): VITAMINB12, FOLATE, FERRITIN, TIBC, IRON, RETICCTPCT in the last 72 hours. Sepsis Labs: No results for input(s): PROCALCITON, LATICACIDVEN in the last 168 hours.  Recent Results (from the past 240 hour(s))  SARS Coronavirus 2 [facility] order, Performed in  [facility] hospital lab) Nasopharyngeal Nasopharyngeal Swab     Status: None   Collection Time: 03/13/19  3:50 PM   Specimen: Nasopharyngeal Swab  Result Value Ref Range Status   SARS Coronavirus 2 NEGATIVE NEGATIVE Final    Comment: (NOTE) If result is NEGATIVE SARS-CoV-2 target nucleic acids are NOT DETECTED. The SARS-CoV-2 RNA is generally detectable in upper and lower  respiratory specimens during the acute phase of infection. The lowest  concentration of SARS-CoV-2 viral copies this assay can detect is 250  copies / mL. A negative result does not preclude SARS-CoV-2 infection  and should not be used as the sole basis for treatment or other  patient management decisions.  A negative result may occur with  improper specimen collection / handling, submission of specimen other  than nasopharyngeal swab, presence of viral mutation(s) within the  areas targeted by this assay, and inadequate number of viral copies  (<250 copies / mL). A negative result must be combined with clinical  observations, patient history, and epidemiological information. If result is POSITIVE SARS-CoV-2 target nucleic acids are DETECTED. The SARS-CoV-2 RNA is generally detectable in upper and lower  respiratory specimens dur ing the acute phase of infection.  Positive  results are indicative of active infection with SARS-CoV-2.  Clinical  correlation with patient history and other diagnostic information is  necessary to determine patient infection status.  Positive results do  not rule out bacterial  infection or co-infection with other viruses. If result is PRESUMPTIVE POSTIVE SARS-CoV-2 nucleic acids MAY BE PRESENT.   A presumptive positive result was obtained on the submitted specimen  and confirmed on repeat testing.  While 2019 novel coronavirus  (SARS-CoV-2) nucleic acids may be present in the submitted sample  additional confirmatory testing may be necessary for epidemiological  and / or clinical management purposes  to differentiate between  SARS-CoV-2 and other Sarbecovirus currently known to infect humans.  If clinically indicated additional testing with an alternate test  methodology [id number]) is advised. The SARS-CoV-2 RNA is generally  detectable in upper and lower respiratory sp ecimens during the acute  phase of infection. The expected result is Negative. Fact Sheet for Patients:  StrictlyIdeas.no Fact Sheet for Healthcare Providers: BankingDealers.co.za This test is not yet approved or cleared by the [place] FDA and has been authorized for detection and/or diagnosis of SARS-CoV-2 by FDA under an Emergency Use Authorization (EUA).  This EUA will remain in effect (meaning this test can be used) for the duration of the COVID-19 declaration under Section 564(b)(1) of the Act, 21 U.S.C. section 360bbb-3(b)(1), unless the authorization is terminated or revoked sooner. Performed at [facility], [place] [address] [place], [place] [address]          Radiology Studies: No results found.      Scheduled Meds: . albuterol  2.5 mg Nebulization TID  . Chlorhexidine Gluconate Cloth  6 each Topical Q0600  . folic acid  1 mg Oral Daily  . multivitamin with minerals  1 tablet Oral Daily  . nicotine  14 mg Transdermal Daily  . pantoprazole (PROTONIX) IV  40 mg Intravenous Q12H  . sertraline  50 mg Oral Daily  . thiamine  100 mg Oral Daily   Continuous Infusions: . sodium chloride    . sodium chloride 100  mL/hr at 03/16/19 0917     LOS: 3 days        Aline August, MD [facility] 03/16/2019, 9:41 AM

## 2019-03-16 NOTE — Progress Notes (Signed)
New Admission Note:   Arrival Method: Bed   Mental Orientation: Alert Oriented  Telemetry: Box 1  Assessment: Completed Skin: bruises on right eye IV: Right AC  Pain: 8/!0 Abdomen  Tubes: Safety Measures: Safety Fall Prevention Plan has been given, discussed and signed Admission: Completed [facility] Orientation: Patient has been orientated to the room, unit and staff.  Family: None   Orders have been reviewed and implemented. Will continue to monitor the patient. Call light has been placed within reach and bed alarm has been activated.   Tsering Leaman RN [facility] Phone: [phone]

## 2019-03-17 DIAGNOSIS — F10129 Alcohol abuse with intoxication, unspecified: Secondary | ICD-10-CM

## 2019-03-17 LAB — CBC WITH DIFFERENTIAL/PLATELET
Abs Immature Granulocytes: 0.11 10*3/uL — ABNORMAL HIGH (ref 0.00–0.07)
Basophils Absolute: 0 10*3/uL (ref 0.0–0.1)
Basophils Relative: 0 %
Eosinophils Absolute: 0 10*3/uL (ref 0.0–0.5)
Eosinophils Relative: 0 %
HCT: 36 % — ABNORMAL LOW (ref 39.0–52.0)
Hemoglobin: 11.6 g/dL — ABNORMAL LOW (ref 13.0–17.0)
Immature Granulocytes: 1 %
Lymphocytes Relative: 16 %
Lymphs Abs: 1.3 10*3/uL (ref 0.7–4.0)
MCH: 27.4 pg (ref 26.0–34.0)
MCHC: 32.2 g/dL (ref 30.0–36.0)
MCV: 85.1 fL (ref 80.0–100.0)
Monocytes Absolute: 1.3 10*3/uL — ABNORMAL HIGH (ref 0.1–1.0)
Monocytes Relative: 16 %
Neutro Abs: 5.3 10*3/uL (ref 1.7–7.7)
Neutrophils Relative %: 67 %
Platelets: 212 10*3/uL (ref 150–400)
RBC: 4.23 MIL/uL (ref 4.22–5.81)
RDW: 20.7 % — ABNORMAL HIGH (ref 11.5–15.5)
WBC: 8 10*3/uL (ref 4.0–10.5)
nRBC: 0 % (ref 0.0–0.2)

## 2019-03-17 LAB — COMPREHENSIVE METABOLIC PANEL
ALT: 17 U/L (ref 0–44)
AST: 21 U/L (ref 15–41)
Albumin: 2.6 g/dL — ABNORMAL LOW (ref 3.5–5.0)
Alkaline Phosphatase: 74 U/L (ref 38–126)
Anion gap: 9 (ref 5–15)
BUN: 10 mg/dL (ref 8–23)
CO2: 23 mmol/L (ref 22–32)
Calcium: 8.1 mg/dL — ABNORMAL LOW (ref 8.9–10.3)
Chloride: 100 mmol/L (ref 98–111)
Creatinine, Ser: 0.71 mg/dL (ref 0.61–1.24)
GFR calc Af Amer: >60 mL/min (ref >60)
GFR calc non Af Amer: >60 mL/min (ref >60)
Glucose, Bld: 97 mg/dL (ref 70–99)
Potassium: 4.5 mmol/L (ref 3.5–5.1)
Sodium: 132 mmol/L — ABNORMAL LOW (ref 135–145)
Total Bilirubin: 0.5 mg/dL (ref 0.3–1.2)
Total Protein: 5.3 g/dL — ABNORMAL LOW (ref 6.5–8.1)

## 2019-03-17 LAB — MAGNESIUM: Magnesium: 1.7 mg/dL (ref 1.7–2.4)

## 2019-03-17 MED ORDER — LORAZEPAM 1 MG PO TABS: 1.0000 mg | ORAL_TABLET | ORAL | Status: DC | PRN

## 2019-03-17 NOTE — Plan of Care (Signed)

## 2019-03-17 NOTE — Evaluation (Signed)
Clinical/Bedside Swallow Evaluation Patient Details  Name: Samuel Reyes MRN: [id number] Date of Birth: October 14, 1955  Today's Date: 03/17/2019 Time: SLP Start Time (ACUTE ONLY): 1207 SLP Stop Time (ACUTE ONLY): 1217 SLP Time Calculation (min) (ACUTE ONLY): 10 min  Past Medical History:  Past Medical History:  Diagnosis Date  . Alcoholism ([place])   . Barrett's esophagus   . CAD (coronary artery disease) 2002   MI, no intervention required  . COPD (chronic obstructive pulmonary disease) (H[facility])    not on home O2  . Depression   . DTs (delirium tremens) ([place])   . GERD (gastroesophageal reflux disease)   . Headache   . Hypertension   . Stroke [facility]) 2006   Past Surgical History:  Past Surgical History:  Procedure Laterality Date  . BACK SURGERY     3 cervical spine surgeries C4-C5 fused  . COLONOSCOPY N/A 01/04/2014   Procedure: COLONOSCOPY;  Surgeon: Danie Binder, MD;  Location: [facility] SUITE;  Service: Endoscopy;  Laterality: N/A;  1:45  . ESOPHAGOGASTRODUODENOSCOPY N/A 01/04/2014   Procedure: ESOPHAGOGASTRODUODENOSCOPY (EGD);  Surgeon: Danie Binder, MD;  Location: [facility] ENDO SUITE;  Service: Endoscopy;  Laterality: N/A;  . FINGER SURGERY Left    2nd, 3rd, & 4th fingers were cut off by table saw and reattached  . GASTRECTOMY    . HERNIA REPAIR    . INCISIONAL HERNIA REPAIR N/A 01/20/2014   Procedure: LAPAROSCOPIC RECURRENT  INCISIONAL HERNIA with mesh;  Surgeon: Edward Jolly, MD;  Location: [facility] ORS;  Service: General;  Laterality: N/A;  . rt knee arthroscopic surgery    . SHOULDER SURGERY Bilateral    3 surgeries on on left, 2 surgeries on right    HPI:  63 year old male with history of CAD, COPD not on oxygen, unspecified type of CVA, hypertension, hypertension, MDD with suicidal attempt and previous psych hospitalization, GERD, upper GI bleed, tobacco use disorder and alcohol use disorder presented with chest and epigastric pain with nausea and fall after alcohol intoxication.   He was admitted to ICU for severe hyponatremia with sodium of 108.  He was treated with IV fluids.  After hyponatremia improved, he was transferred to [facility] care on 03/15/2019. Clinical swallow eval January of 2018 with recs for regular diet, thin liquids, and GI f/u given long-term issues with reflux.  Barrett's esophagus dx'd in 2015.  Presence of endplate osteophytes at multiple levels. Severe degenerative disc disease C3-4, C4-5 and C6-7.  Fusion of C5 and 6 vertebra, degenerative in etiology per CT cervical spine this admission.    Assessment / Plan / Recommendation Clinical Impression  Pt describes chronic and worsening swallow function over last several weeks, leading to coughing with all POs, including beer.  Oral mechanism exam is normal.  Pt demonstrates consistent coughing with all thin liquids, intermittent throat clearing with solids and endorsement of globus as well as material lodging in throat.  Recommend pursuing MBS while pt is admitted.  His lungs remain clear, last CXR not concerning for aspiration - in light of chronic nature of deficits and lung sounds stable, recommend continuing current diet pending study. D/W Pt, who agrees with plan.  MBS schedule full for today; will schedule for am.   SLP Visit Diagnosis: Dysphagia, unspecified (R13.10)    Aspiration Risk       Diet Recommendation   regular solids, thin liquids  Medication Administration: Whole meds with liquid    Other  Recommendations Oral Care Recommendations: Oral care BID   Follow  up Recommendations Other (comment)(tba)      Frequency and Duration min 2x/week  1 week       Prognosis        Swallow Study   General Date of Onset: 03/14/19 HPI: 63 year old male with history of CAD, COPD not on oxygen, unspecified type of CVA, hypertension, hypertension, MDD with suicidal attempt and previous psych hospitalization, GERD, upper GI bleed, tobacco use disorder and alcohol use disorder presented with chest and  epigastric pain with nausea and fall after alcohol intoxication.  He was admitted to ICU for severe hyponatremia with sodium of 108.  He was treated with IV fluids.  After hyponatremia improved, he was transferred to [facility] care on 03/15/2019. Clinical swallow eval January of 2018 with recs for regular diet, thin liquids, and GI f/u given long-term issues with reflux.  Barrett's esophagus dx'd in 2015.  Presence of endplate osteophytes at multiple levels. Severe degenerative disc disease C3-4, C4-5 and C6-7.  Fusion of C5 and 6 vertebra, degenerative in etiology per CT cervical spine this admission.  Type of Study: Bedside Swallow Evaluation Previous Swallow Assessment: see HPI Diet Prior to this Study: Regular;Thin liquids Temperature Spikes Noted: No Respiratory Status: Room air History of Recent Intubation: No Behavior/Cognition: Alert;Cooperative;Pleasant mood Oral Cavity Assessment: Within Functional Limits Oral Care Completed by SLP: No Oral Cavity - Dentition: Edentulous Vision: Functional for self-feeding Self-Feeding Abilities: Able to feed self Patient Positioning: Upright in bed Volitional Cough: Strong Volitional Swallow: Able to elicit    Oral/Motor/Sensory Function Overall Oral Motor/Sensory Function: Within functional limits   Ice Chips Ice chips: Within functional limits   Thin Liquid Thin Liquid: Impaired Presentation: Cup Pharyngeal  Phase Impairments: Cough - Immediate    Nectar Thick Nectar Thick Liquid: Not tested   Honey Thick Honey Thick Liquid: Not tested   Puree Puree: Impaired Presentation: Self Fed Pharyngeal Phase Impairments: Throat Clearing - Immediate   Solid     Solid: Impaired Presentation: Self Fed Pharyngeal Phase Impairments: Throat Clearing - Immediate      Samuel Reyes 03/17/2019,1:06 PM  Samuel Reyes L. Tivis Ringer, [place] Office number [phone] Pager [phone]

## 2019-03-17 NOTE — Progress Notes (Signed)
Physical Therapy Treatment Patient Details Name: Samuel Reyes MRN: [id number] DOB: December 30, 1955 Today's Date: 03/17/2019    History of Present Illness Pt adm with fall, acute intoxication, and hyponatremia. PMH - copd, etoh abuse, cad, tobacco use    PT Comments    Continuing work on functional mobility and activity tolerance;  Improving activity tolerance, and showing overall good control of Rollator RW; Observed pt having swallowing difficulty earlier -- appreciate ST consult   Follow Up Recommendations  No PT follow up     Equipment Recommendations  Other (comment)(Rollator)    Recommendations for Other Services       Precautions / Restrictions Precautions Precautions: Fall Precaution Comments: Noted difficulty with swallowing as well    Mobility  Bed Mobility               General bed mobility comments: OOB in chair upon PT arrival  Transfers Overall transfer level: Needs assistance Equipment used: 4-wheeled walker Transfers: Sit to/from Stand Sit to Stand: Min guard(without physical contact)         General transfer comment: Close guard for safety; noted reached out for UE support  Ambulation/Gait Ambulation/Gait assistance: Min guard(without physical contact) Gait Distance (Feet): 150 Feet Assistive device: 4-wheeled walker Gait Pattern/deviations: Step-through pattern;Decreased step length - right;Decreased step length - left Gait velocity: decr   General Gait Details: Able to engage in conversation (re: swallowing); steadier today, with less right/left drift   Stairs             Wheelchair Mobility    Modified Rankin (Stroke Patients Only)       Balance     Sitting balance-Leahy Scale: Fair       Standing balance-Leahy Scale: Fair                              Cognition Arousal/Alertness: Awake/alert(though sleepy) Behavior During Therapy: WFL for tasks assessed/performed Overall Cognitive Status: Within  Functional Limits for tasks assessed                                        Exercises      General Comments General comments (skin integrity, edema, etc.): Walked on Room Air      Pertinent Vitals/Pain Pain Assessment: No/denies pain    Home Living                      Prior Function            PT Goals (current goals can now be found in the care plan section) Acute Rehab PT Goals Patient Stated Goal: get another rollator PT Goal Formulation: With patient Time For Goal Achievement: 03/30/19 Potential to Achieve Goals: Good Progress towards PT goals: Progressing toward goals    Frequency    Min 3X/week      PT Plan Current plan remains appropriate    Co-evaluation              AM-PAC PT "6 Clicks" Mobility   Outcome Measure  Help needed turning from your back to your side while in a flat bed without using bedrails?: None Help needed moving from lying on your back to sitting on the side of a flat bed without using bedrails?: A Little Help needed moving to and from a bed to a chair (including a wheelchair)?: A  Little Help needed standing up from a chair using your arms (e.g., wheelchair or bedside chair)?: A Little Help needed to walk in hospital room?: A Little Help needed climbing 3-5 steps with a railing? : A Little 6 Click Score: 19    End of Session   Activity Tolerance: Patient tolerated treatment well Patient left: in bed;Other (comment)(sitting EOB with SLP) Nurse Communication: Mobility status PT Visit Diagnosis: Unsteadiness on feet (R26.81)     Time: 2080-2233 PT Time Calculation (min) (ACUTE ONLY): 10 min  Charges:  $Gait Training: 8-22 mins                     Roney Marion, PT  Acute Rehabilitation Services Pager [phone] Office [place] 03/17/2019, 12:57 PM

## 2019-03-17 NOTE — Progress Notes (Signed)
Patient ID: Samuel Reyes, male   DOB: 23-Mar-1956, 63 y.o.   MRN: [id number]  PROGRESS NOTE    Taitum Alms  MVH:[id number] DOB: [birth year] DOA: 03/13/2019 PCP: [name], NP   Brief Narrative:  63 year old male with history of CAD, COPD not on oxygen, unspecified type of CVA, hypertension, hypertension, MDD with suicidal attempt and previous psych hospitalization, GERD, upper GI bleed, tobacco use disorder and alcohol use disorder presented with chest and epigastric pain with nausea and fall after alcohol intoxication.  He was admitted to ICU for severe hyponatremia with sodium of 108.  He was treated with IV fluids.  After hyponatremia improved, he was transferred to [facility] care on 03/15/2019.  Assessment & Plan:   Severe hyponatremia: Likely due to poor intake related to alcohol abuse. -Presented with sodium of 108 and admitted to ICU and treated with IV fluids. -Sodium level is 132 today. -Oral intake is still not that great.  Decrease normal saline to 75 cc an hour.  Repeat a.m. sodium.  Hypokalemia -Resolved.  Alcohol intoxication/alcohol use disorder with severe dependence: History of DT during past admission. -Continue CIWA protocol with Ativan. -Continue folate, multivitamin and thiamine -Social worker consult for the same. -Counseled regarding alcohol abstinence.  Atypical chest pain/epigastric pain/history of GERD/history of upper GI bleed: On NSAID at home. -Recommended avoiding NSAIDs. -Serial troponins negative.  EKG reassuring. -H&H stable. -Continue PPI twice daily.    Tobacco use disorder: Smokes about a pack a day. -Cessation counseling -Nicotine patch  History of CAD: Stable.  Not on medication.  Serial troponin negative.  EKG without acute ischemic finding. -Continue monitoring.  Outpatient follow-up  Chronic COPD: Stable.  On albuterol at home.  Not on oxygen. -PRN albuterol  History of MDD/suicidal ideation: Stable.  Denies suicidal  ideation or audiovisual hallucination. -Continue home Zoloft -Continue trazodone nightly. -Outpatient psychiatry evaluation.  Borderline QT: QTC 485 -Minimize QTC prolonging drugs. -Improved.  History of CVA: Stable -Continue home meds  Social issue/homelessness -Appreciate CSW help  Chronic headache:  -PRN Tylenol.  Generalized deconditioning -PT evaluation is pending.    DVT prophylaxis: SCD.  Start Lovenox. Code Status: Full code Family Communication: Patient has no family members. Disposition Plan:  Discharge in 1 to 2 days if clinically improved and pending PT evaluation. Consultants: PCCM  Procedures: None  Antimicrobials: None   Subjective: Patient seen and examined at bedside.  He is sleepy, wakes up slightly on calling his name.  He is a poor historian.  He states that he feels lost and does not know how to go back.  He does not feel ready for discharge.  No overnight fever or vomiting reported by nursing staff.  Objective: Vitals:   03/16/19 1957 03/16/19 2203 03/17/19 0751 03/17/19 0903  BP:  (!) 142/79  (!) 144/78  Pulse:  (!) 107  93  Resp:  20  (!) 24  Temp:  97.9 F (36.6 C)  98.8 F (37.1 C)  TempSrc:    Oral  SpO2: 98% 94% 95% 94%  Weight:      Height:        Intake/Output Summary (Last 24 hours) at 03/17/2019 0951 Last data filed at 03/17/2019 0900 Gross per 24 hour  Intake 1000.84 ml  Output 1550 ml  Net -549.16 ml   Filed Weights   03/15/19 0409 03/16/19 0500 03/16/19 1437  Weight: 64.1 kg 65.4 kg 66.2 kg    Examination:  General exam: No acute distress.  Sleepy, wakes up slightly, answers  some questions but is a poor historian.   Respiratory system: Bilateral decreased breath sounds at bases, scattered crackles.  No wheezing  cardiovascular system: S1 & S2 heard, intermittently tachycardic Gastrointestinal system: Abdomen is nondistended, soft and nontender. Normal bowel sounds heard. Extremities: No cyanosis, edema   Data  Reviewed: I have personally reviewed following labs and imaging studies  CBC: Recent Labs  Lab 03/13/19 1248 03/13/19 2127 03/14/19 0634 03/15/19 0643 03/16/19 0422 03/17/19 0530  WBC 16.6* 16.0* 15.1* 10.5 11.0* 8.0  NEUTROABS 13.0*  --   --   --   --  5.3  HGB 13.0 12.4* 12.6* 12.8* 12.3* 11.6*  HCT 37.0* 34.2* 36.0* 38.6* 37.1* 36.0*  MCV 77.2* 76.0* 77.9* 81.3 82.1 85.1  PLT 253 210 196 204 180 993   Basic Metabolic Panel: Recent Labs  Lab 03/13/19 2127  03/14/19 0634  03/15/19 0643 03/15/19 1032 03/15/19 1430 03/15/19 1641 03/15/19 2233 03/16/19 0422 03/17/19 0530  NA 112*   < > 115*   < > 126* 126* 125* 122* 125* 129* 132*  K 3.0*   < > 4.0   < > 3.6 4.0 3.7  --   --  3.4* 4.5  CL 68*   < > 73*   < > 88* 88* 87*  --   --  91* 100  CO2 26   < > 26   < > 28 26 27   --   --  26 23  GLUCOSE 78   < > 70   < > 121* 113* 95  --   --  114* 97  BUN 6*   < > 6*   < > <5* 6* 9  --   --  9 10  CREATININE 0.61   < > 0.77   < > 0.72 0.74 0.92  --   --  0.74 0.71  CALCIUM 7.3*   < > 7.8*   < > 8.0* 8.4* 8.4*  --   --  8.3* 8.1*  MG 2.2  --  2.2  --  2.3  --   --   --   --  1.9 1.7  PHOS 2.1*  --  2.0*  --  2.7  --   --   --   --  2.9  --    < > = values in this interval not displayed.   GFR: Estimated Creatinine Clearance: 79.1 mL/min (by C-G formula based on SCr of 0.71 mg/dL). Liver Function Tests: Recent Labs  Lab 03/13/19 1248 03/16/19 0422 03/17/19 0530  AST 35 18 21  ALT 17 17 17   ALKPHOS 89 81 74  BILITOT 0.8 0.5 0.5  PROT 6.2* 5.7* 5.3*  ALBUMIN 3.4* 2.8* 2.6*   Recent Labs  Lab 03/13/19 1248  LIPASE 26   No results for input(s): AMMONIA in the last 168 hours. Coagulation Profile: Recent Labs  Lab 03/13/19 1818  INR 0.9   Cardiac Enzymes: No results for input(s): CKTOTAL, CKMB, CKMBINDEX, TROPONINI in the last 168 hours. BNP (last 3 results) No results for input(s): PROBNP in the last 8760 hours. HbA1C: No results for input(s): HGBA1C in the  last 72 hours. CBG: Recent Labs  Lab 03/13/19 1940  GLUCAP 87   Lipid Profile: No results for input(s): CHOL, HDL, LDLCALC, TRIG, CHOLHDL, LDLDIRECT in the last 72 hours. Thyroid Function Tests: No results for input(s): TSH, T4TOTAL, FREET4, T3FREE, THYROIDAB in the last 72 hours. Anemia Panel: No results for input(s): VITAMINB12, FOLATE, FERRITIN, TIBC,  IRON, RETICCTPCT in the last 72 hours. Sepsis Labs: No results for input(s): PROCALCITON, LATICACIDVEN in the last 168 hours.  Recent Results (from the past 240 hour(s))  SARS Coronavirus 2 [facility] order, Performed in [facility] hospital lab) Nasopharyngeal Nasopharyngeal Swab     Status: None   Collection Time: 03/13/19  3:50 PM   Specimen: Nasopharyngeal Swab  Result Value Ref Range Status   SARS Coronavirus 2 NEGATIVE NEGATIVE Final    Comment: (NOTE) If result is NEGATIVE SARS-CoV-2 target nucleic acids are NOT DETECTED. The SARS-CoV-2 RNA is generally detectable in upper and lower  respiratory specimens during the acute phase of infection. The lowest  concentration of SARS-CoV-2 viral copies this assay can detect is 250  copies / mL. A negative result does not preclude SARS-CoV-2 infection  and should not be used as the sole basis for treatment or other  patient management decisions.  A negative result may occur with  improper specimen collection / handling, submission of specimen other  than nasopharyngeal swab, presence of viral mutation(s) within the  areas targeted by this assay, and inadequate number of viral copies  (<250 copies / mL). A negative result must be combined with clinical  observations, patient history, and epidemiological information. If result is POSITIVE SARS-CoV-2 target nucleic acids are DETECTED. The SARS-CoV-2 RNA is generally detectable in upper and lower  respiratory specimens dur ing the acute phase of infection.  Positive  results are indicative of active infection with SARS-CoV-2.   Clinical  correlation with patient history and other diagnostic information is  necessary to determine patient infection status.  Positive results do  not rule out bacterial infection or co-infection with other viruses. If result is PRESUMPTIVE POSTIVE SARS-CoV-2 nucleic acids MAY BE PRESENT.   A presumptive positive result was obtained on the submitted specimen  and confirmed on repeat testing.  While 2019 novel coronavirus  (SARS-CoV-2) nucleic acids may be present in the submitted sample  additional confirmatory testing may be necessary for epidemiological  and / or clinical management purposes  to differentiate between  SARS-CoV-2 and other Sarbecovirus currently known to infect humans.  If clinically indicated additional testing with an alternate test  methodology [phone]) is advised. The SARS-CoV-2 RNA is generally  detectable in upper and lower respiratory sp ecimens during the acute  phase of infection. The expected result is Negative. Fact Sheet for Patients:  StrictlyIdeas.no Fact Sheet for Healthcare Providers: BankingDealers.co.za This test is not yet approved or cleared by the [place] FDA and has been authorized for detection and/or diagnosis of SARS-CoV-2 by FDA under an Emergency Use Authorization (EUA).  This EUA will remain in effect (meaning this test can be used) for the duration of the COVID-19 declaration under Section 564(b)(1) of the Act, 21 U.S.C. section 360bbb-3(b)(1), unless the authorization is terminated or revoked sooner. Performed at [facility], [place] [address] [place], [place] [address]          Radiology Studies: No results found.      Scheduled Meds: . albuterol  2.5 mg Nebulization TID  . Chlorhexidine Gluconate Cloth  6 each Topical Q0600  . folic acid  1 mg Oral Daily  . multivitamin with minerals  1 tablet Oral Daily  . nicotine  14 mg Transdermal Daily  . pantoprazole   40 mg Oral BID AC  . sertraline  50 mg Oral Daily  . thiamine  100 mg Oral Daily   Continuous Infusions: . sodium chloride 100 mL/hr  at 03/16/19 1542     LOS: 4 days        Aline August, MD [facility] 03/17/2019, 9:51 AM

## 2019-03-18 ENCOUNTER — Inpatient Hospital Stay (HOSPITAL_COMMUNITY): Payer: Medicaid Other

## 2019-03-18 DIAGNOSIS — R05 Cough: Secondary | ICD-10-CM

## 2019-03-18 DIAGNOSIS — Z8673 Personal history of transient ischemic attack (TIA), and cerebral infarction without residual deficits: Secondary | ICD-10-CM

## 2019-03-18 LAB — BASIC METABOLIC PANEL
Anion gap: 8 (ref 5–15)
BUN: 9 mg/dL (ref 8–23)
CO2: 24 mmol/L (ref 22–32)
Calcium: 8.1 mg/dL — ABNORMAL LOW (ref 8.9–10.3)
Chloride: 100 mmol/L (ref 98–111)
Creatinine, Ser: 0.62 mg/dL (ref 0.61–1.24)
GFR calc Af Amer: >60 mL/min (ref >60)
GFR calc non Af Amer: >60 mL/min (ref >60)
Glucose, Bld: 101 mg/dL — ABNORMAL HIGH (ref 70–99)
Potassium: 4.2 mmol/L (ref 3.5–5.1)
Sodium: 132 mmol/L — ABNORMAL LOW (ref 135–145)

## 2019-03-18 LAB — LIPID PANEL
Cholesterol: 107 mg/dL (ref 0–200)
HDL: 54 mg/dL (ref >40)
LDL Cholesterol: 46 mg/dL (ref 0–99)
Total CHOL/HDL Ratio: 2 RATIO
Triglycerides: 35 mg/dL (ref <150)
VLDL: 7 mg/dL (ref 0–40)

## 2019-03-18 LAB — MAGNESIUM: Magnesium: 1.7 mg/dL (ref 1.7–2.4)

## 2019-03-18 MED ORDER — MAGNESIUM SULFATE 2 GM/50ML IV SOLN
2.0000 g | Freq: Once | INTRAVENOUS | Status: AC
  Administered 2019-03-18: 2 g via INTRAVENOUS
  Filled 2019-03-18: qty 50

## 2019-03-18 MED ORDER — BENZONATATE 100 MG PO CAPS
100.0000 mg | ORAL_CAPSULE | Freq: Three times a day (TID) | ORAL | Status: DC | PRN
  Administered 2019-03-18: 100 mg via ORAL
  Filled 2019-03-18: qty 1

## 2019-03-18 NOTE — Progress Notes (Addendum)
PROGRESS NOTE    Samuel Reyes  YTK:[id number] DOB: 1955/09/16 DOA: 03/13/2019 PCP: [name], NP   Brief Narrative:  HPI on 03/13/2019 by Dr. [name] (PCCM) [name] is a 63 y/o gentleman who presents to the ED complaining of chest pain and nausea after having a fall at home. He drinks alcohol daily. He reports that the fall was due to losing his balance, and he hit the right side of his head when he fell. He has had intermittent chest pain for a few days. In the ED he was vomiting coffee ground emesis and was found to have a sodium of 108. He was given 1L NS. He has gone through DTs in previous admissions treated with PRN benzos.  Currently his only complaints are central chest burning, which he attributes to his reflux, and nausea, which is improved. He admits to feeling weak over the last few days.  He has been drinking about 30 beers a day for the last few days, and increased from his baseline of 12-15 beers per day.  He continues to smoke 1 pack/day.  He is on interested in attempting to quit.  He admits to not eating in the last few days.  Interim history Admitted to the ICU with severe hyponatremia, sodium 108. Placed on IVF. Sodium improving. Transferred to [facility] on 03/15/2019  Assessment & Plan   Severe hyponatremia  -Likely secondary to poor oral intake as well as alcohol abuse -Upon admission, sodium was 108 -Patient was placed on IV fluids, sodium currently 132 -Patient still requiring IV fluids as his oral intake is still poor -Continue to monitor BMP  Hypokalemia -resolved   Alcohol intoxication/alcohol use disorder with severe dependence -Patient with history of DTs in the past -Continue CIWA protocol with Ativan, multivitamin, thiamine, folate -Social work consulted -dicussed alcohol abstinence  Cough -Complaining of nonproductive cough which is been ongoing for the last several days -Will obtain chest x-ray and place patient on antitussives  Atypical  chest pain/epigastric pain/GERD/history of upper GI bleed -Patient on NSAIDs at home -Recommend avoiding NSAIDs -Troponins cycled and unremarkable -Hemoglobin currently stable -Continue PPI twice daily  Tobacco use disorder -Smokes approximately 1 pack/day -Continue nicotine patch -Smoking cessation discussed  History of CAD -Stable, currently no chest pain -Troponin unremarkable, EKG without acute findings -Outpatient follow-up -Currently not on any home medications  Chronic COPD -Stable, currently not on any oxygen -Continue albuterol as needed  History of MDD/suicidal ideation -Stable, currently denies any suicidal ideation or hallucinations -Continue Zoloft, trazodone -Outpatient psychiatry  Prolonged QTC -Borderline QTC, 485  History of CVA -Stable -Currently not on statin or aspirin (secondary to h/o GIB) -will obtain lipid panel  Social issues/homelessness -Social work consulted  Chronic headache -Continue Tylenol as needed  Generalized weakness and deconditioning -PT consulted, no further therapy needs  DVT Prophylaxis  SCDs  Code Status: Full  Family Communication: None at bedside  Disposition Plan: Admitted. Pending improvement. Suspect discharge in 1- 2 days.  Consultants PCCM  Procedures  None  Antibiotics   Anti-infectives (From admission, onward)   None      Subjective:   Samuel Reyes seen and examined today.  Patient states he is not feeling well this morning.  Started having a cough several days ago.  Nonproductive.  Denies current chest pain or shortness of breath, abdominal pain, nausea or vomiting, diarrhea or constipation, dizziness or headache.  Objective:   Vitals:   03/17/19 2053 03/18/19 0500 03/18/19 0500 03/18/19 0700  BP: (!) 149/81  (!) 157/82   Pulse: 96  87 92  Resp: 18  18 18   Temp: 98.6 F (37 C)  98 F (36.7 C)   TempSrc: Oral     SpO2: 96%  97% 98%  Weight: 66.4 kg 66.4 kg    Height:         Intake/Output Summary (Last 24 hours) at 03/18/2019 0916 Last data filed at 03/18/2019 0601 Gross per 24 hour  Intake 847.6 ml  Output 600 ml  Net 247.6 ml   Filed Weights   03/16/19 1437 03/17/19 2053 03/18/19 0500  Weight: 66.2 kg 66.4 kg 66.4 kg    Exam  General: Well developed, well nourished, NAD, appears stated age  HEENT: NCAT,  mucous membranes moist.   Cardiovascular: S1 S2 auscultated, RRR, no murmur  Respiratory: Diminished breath sound, occ cough. No wheezing  Abdomen: Soft, nontender, nondistended, + bowel sounds  Extremities: warm dry without cyanosis clubbing or edema  Neuro: AAOx3, nonfocal  Psych: Flat   Data Reviewed: I have personally reviewed following labs and imaging studies  CBC: Recent Labs  Lab 03/13/19 1248 03/13/19 2127 03/14/19 0634 03/15/19 0643 03/16/19 0422 03/17/19 0530  WBC 16.6* 16.0* 15.1* 10.5 11.0* 8.0  NEUTROABS 13.0*  --   --   --   --  5.3  HGB 13.0 12.4* 12.6* 12.8* 12.3* 11.6*  HCT 37.0* 34.2* 36.0* 38.6* 37.1* 36.0*  MCV 77.2* 76.0* 77.9* 81.3 82.1 85.1  PLT 253 210 196 204 180 248   Basic Metabolic Panel: Recent Labs  Lab 03/13/19 2127  03/14/19 0634  03/15/19 0643 03/15/19 1032 03/15/19 1430 03/15/19 1641 03/15/19 2233 03/16/19 0422 03/17/19 0530 03/18/19 0601  NA 112*   < > 115*   < > 126* 126* 125* 122* 125* 129* 132* 132*  K 3.0*   < > 4.0   < > 3.6 4.0 3.7  --   --  3.4* 4.5 4.2  CL 68*   < > 73*   < > 88* 88* 87*  --   --  91* 100 100  CO2 26   < > 26   < > 28 26 27   --   --  26 23 24   GLUCOSE 78   < > 70   < > 121* 113* 95  --   --  114* 97 101*  BUN 6*   < > 6*   < > <5* 6* 9  --   --  9 10 9   CREATININE 0.61   < > 0.77   < > 0.72 0.74 0.92  --   --  0.74 0.71 0.62  CALCIUM 7.3*   < > 7.8*   < > 8.0* 8.4* 8.4*  --   --  8.3* 8.1* 8.1*  MG 2.2  --  2.2  --  2.3  --   --   --   --  1.9 1.7 1.7  PHOS 2.1*  --  2.0*  --  2.7  --   --   --   --  2.9  --   --    < > = values in this interval not  displayed.   GFR: Estimated Creatinine Clearance: 79.1 mL/min (by C-G formula based on SCr of 0.62 mg/dL). Liver Function Tests: Recent Labs  Lab 03/13/19 1248 03/16/19 0422 03/17/19 0530  AST 35 18 21  ALT 17 17 17   ALKPHOS 89 81 74  BILITOT 0.8 0.5 0.5  PROT  6.2* 5.7* 5.3*  ALBUMIN 3.4* 2.8* 2.6*   Recent Labs  Lab 03/13/19 1248  LIPASE 26   No results for input(s): AMMONIA in the last 168 hours. Coagulation Profile: Recent Labs  Lab 03/13/19 1818  INR 0.9   Cardiac Enzymes: No results for input(s): CKTOTAL, CKMB, CKMBINDEX, TROPONINI in the last 168 hours. BNP (last 3 results) No results for input(s): PROBNP in the last 8760 hours. HbA1C: No results for input(s): HGBA1C in the last 72 hours. CBG: Recent Labs  Lab 03/13/19 1940  GLUCAP 87   Lipid Profile: No results for input(s): CHOL, HDL, LDLCALC, TRIG, CHOLHDL, LDLDIRECT in the last 72 hours. Thyroid Function Tests: No results for input(s): TSH, T4TOTAL, FREET4, T3FREE, THYROIDAB in the last 72 hours. Anemia Panel: No results for input(s): VITAMINB12, FOLATE, FERRITIN, TIBC, IRON, RETICCTPCT in the last 72 hours. Urine analysis:    Component Value Date/Time   COLORURINE YELLOW 12/31/2018 1055   APPEARANCEUR CLEAR 12/31/2018 1055   LABSPEC 1.020 12/31/2018 1055   PHURINE 6.0 12/31/2018 1055   GLUCOSEU NEGATIVE 12/31/2018 1055   HGBUR NEGATIVE 12/31/2018 1055   BILIRUBINUR NEGATIVE 12/31/2018 1055   KETONESUR NEGATIVE 12/31/2018 1055   PROTEINUR NEGATIVE 12/31/2018 1055   UROBILINOGEN 0.2 05/31/2015 2156   NITRITE NEGATIVE 12/31/2018 1055   LEUKOCYTESUR NEGATIVE 12/31/2018 1055   Sepsis Labs: @LABRCNTIP (procalcitonin:4,lacticidven:4)  ) Recent Results (from the past 240 hour(s))  SARS Coronavirus 2 [facility] order, Performed in [facility] hospital lab) Nasopharyngeal Nasopharyngeal Swab     Status: None   Collection Time: 03/13/19  3:50 PM   Specimen: Nasopharyngeal Swab  Result Value Ref  Range Status   SARS Coronavirus 2 NEGATIVE NEGATIVE Final    Comment: (NOTE) If result is NEGATIVE SARS-CoV-2 target nucleic acids are NOT DETECTED. The SARS-CoV-2 RNA is generally detectable in upper and lower  respiratory specimens during the acute phase of infection. The lowest  concentration of SARS-CoV-2 viral copies this assay can detect is 250  copies / mL. A negative result does not preclude SARS-CoV-2 infection  and should not be used as the sole basis for treatment or other  patient management decisions.  A negative result may occur with  improper specimen collection / handling, submission of specimen other  than nasopharyngeal swab, presence of viral mutation(s) within the  areas targeted by this assay, and inadequate number of viral copies  (<250 copies / mL). A negative result must be combined with clinical  observations, patient history, and epidemiological information. If result is POSITIVE SARS-CoV-2 target nucleic acids are DETECTED. The SARS-CoV-2 RNA is generally detectable in upper and lower  respiratory specimens dur ing the acute phase of infection.  Positive  results are indicative of active infection with SARS-CoV-2.  Clinical  correlation with patient history and other diagnostic information is  necessary to determine patient infection status.  Positive results do  not rule out bacterial infection or co-infection with other viruses. If result is PRESUMPTIVE POSTIVE SARS-CoV-2 nucleic acids MAY BE PRESENT.   A presumptive positive result was obtained on the submitted specimen  and confirmed on repeat testing.  While 2019 novel coronavirus  (SARS-CoV-2) nucleic acids may be present in the submitted sample  additional confirmatory testing may be necessary for epidemiological  and / or clinical management purposes  to differentiate between  SARS-CoV-2 and other Sarbecovirus currently known to infect humans.  If clinically indicated additional testing with an  alternate test  methodology [phone]) is advised. The SARS-CoV-2 RNA is generally  detectable in upper and lower respiratory sp ecimens during the acute  phase of infection. The expected result is Negative. Fact Sheet for Patients:  StrictlyIdeas.no Fact Sheet for Healthcare Providers: BankingDealers.co.za This test is not yet approved or cleared by the [place] FDA and has been authorized for detection and/or diagnosis of SARS-CoV-2 by FDA under an Emergency Use Authorization (EUA).  This EUA will remain in effect (meaning this test can be used) for the duration of the COVID-19 declaration under Section 564(b)(1) of the Act, 21 U.S.C. section 360bbb-3(b)(1), unless the authorization is terminated or revoked sooner. Performed at [facility], [place] [address], [place], [place] [address]       Radiology Studies: No results found.   Scheduled Meds: . albuterol  2.5 mg Nebulization TID  . Chlorhexidine Gluconate Cloth  6 each Topical Q0600  . folic acid  1 mg Oral Daily  . multivitamin with minerals  1 tablet Oral Daily  . nicotine  14 mg Transdermal Daily  . pantoprazole  40 mg Oral BID AC  . sertraline  50 mg Oral Daily  . thiamine  100 mg Oral Daily   Continuous Infusions: . sodium chloride 75 mL/hr at 03/18/19 0208     LOS: 5 days   Time Spent in minutes   45 minutes  Belkys Henault D.O. on 03/18/2019 at 9:16 AM  Between 7am to 7pm - Please see pager noted on [email or web address]  After 7pm go to [email or web address]  And look for the night coverage person covering for me after hours  [facility] Office  [phone]

## 2019-03-18 NOTE — Plan of Care (Signed)
°  Problem: Coping: °Goal: Level of anxiety will decrease °Outcome: Progressing °  °

## 2019-03-18 NOTE — Progress Notes (Signed)
Modified Barium Swallow Progress Note  Patient Details  Name: Christerpher Clos MRN: [id number] Date of Birth: [birth year]  Today's Date: 03/18/2019  Modified Barium Swallow completed.  Full report located under Chart Review in the Imaging Section.  Brief recommendations include the following:  Clinical Impression  Pt presents with quite normal oropharyngeal swallow, presenting with no dysphagia, no coughing during study.  There was adequate oral mastication and oral control, good timing of swallow onset, excellent pharyngeal clearance with no residue-post swallow, and no penetration nor aspiration.  Brief scan of esophagus revealed barium retention in distal esophagus.  Reviewed video with pt in real time - recommend continuing regular solids, thin liquids.  Discussed basic precautions with pt to improve comfort and decrease coughing episodes.  Pt agrees.  No f/u needed. SLP to sign off.    Swallow Evaluation Recommendations       SLP Diet Recommendations: Regular solids;Thin liquid   Liquid Administration via: Cup;Straw   Medication Administration: Whole meds with liquid   Supervision: Patient able to self feed       Postural Changes: Seated upright at 90 degrees   Oral Care Recommendations: Oral care BID     Michele Judy L. Tivis Ringer, [place] Office number [phone] Pager [phone]    [name] 03/18/2019,9:48 AM

## 2019-03-19 DIAGNOSIS — F329 Major depressive disorder, single episode, unspecified: Secondary | ICD-10-CM

## 2019-03-19 LAB — BASIC METABOLIC PANEL
Anion gap: 9 (ref 5–15)
BUN: 10 mg/dL (ref 8–23)
CO2: 24 mmol/L (ref 22–32)
Calcium: 8.1 mg/dL — ABNORMAL LOW (ref 8.9–10.3)
Chloride: 101 mmol/L (ref 98–111)
Creatinine, Ser: 0.75 mg/dL (ref 0.61–1.24)
GFR calc Af Amer: >60 mL/min (ref >60)
GFR calc non Af Amer: >60 mL/min (ref >60)
Glucose, Bld: 99 mg/dL (ref 70–99)
Potassium: 3.9 mmol/L (ref 3.5–5.1)
Sodium: 134 mmol/L — ABNORMAL LOW (ref 135–145)

## 2019-03-19 LAB — MAGNESIUM: Magnesium: 1.9 mg/dL (ref 1.7–2.4)

## 2019-03-19 MED ORDER — FOLIC ACID 1 MG PO TABS: 1.0000 mg | ORAL_TABLET | Freq: Every day | ORAL | Status: DC

## 2019-03-19 MED ORDER — ADULT MULTIVITAMIN W/MINERALS CH: 1.0000 | ORAL_TABLET | Freq: Every day | ORAL | Status: DC

## 2019-03-19 MED ORDER — PANTOPRAZOLE SODIUM 40 MG PO TBEC: 40.0000 mg | DELAYED_RELEASE_TABLET | Freq: Two times a day (BID) | ORAL | 1 refills | Status: DC

## 2019-03-19 MED ORDER — THIAMINE HCL 100 MG PO TABS: 100.0000 mg | ORAL_TABLET | Freq: Every day | ORAL | Status: DC

## 2019-03-19 MED ORDER — NICOTINE 14 MG/24HR TD PT24: 14.0000 mg | MEDICATED_PATCH | Freq: Every day | TRANSDERMAL | 0 refills | Status: DC

## 2019-03-19 MED FILL — PANTOPRAZOLE SOD DR 40 MG T: 40 | 30 days supply | Qty: 60 | Fill #0

## 2019-03-19 MED FILL — NICOTINE 14 MG/24HR PATCH: 14 | 28 days supply | Qty: 28 | Fill #0

## 2019-03-19 NOTE — Discharge Instructions (Signed)
Hyponatremia Hyponatremia is when the amount of salt (sodium) in your blood is too low. When salt levels are low, your body may take in extra water. This can cause swelling throughout the body. The swelling often affects the brain. What are the causes? This condition may be caused by:  Certain medical problems or conditions.  Vomiting a lot.  Having watery poop (diarrhea) often.  Certain medicines or illegal drugs.  Not having enough water in the body (dehydration).  Drinking too much water.  Eating a diet that is low in salt.  Large burns on your body.  Too much sweating. What increases the risk? You are more likely to get this condition if you:  Have long-term (chronic) kidney disease.  Have heart failure.  Have a medical condition that causes you to have watery poop often.  Do very hard exercises.  Take medicines that affect the amount of salt is in your blood. What are the signs or symptoms? Symptoms of this condition include:  Headache.  Feeling like you may vomit (nausea).  Vomiting.  Being very tired (lethargic).  Muscle weakness and cramps.  Not wanting to eat as much as normal (loss of appetite).  Feeling weak or light-headed. Severe symptoms of this condition include:  Confusion.  Feeling restless (agitation).  Having a fast heart rate.  Passing out (fainting).  Seizures.  Coma. How is this treated? Treatment for this condition depends on the cause. Treatment may include:  Getting fluids through an IV tube that is put into one of your veins.  Taking medicines to fix the salt levels in your blood. If medicines are causing the problem, your medicines will need to be changed.  Limiting how much water or fluid you take in.  Monitoring in the hospital to watch your symptoms. Follow these instructions at home:   Take over-the-counter and prescription medicines only as told by your doctor. Many medicines can make this condition worse.  Talk with your doctor about any medicines that you are taking.  Eat and drink exactly as you are told by your doctor. ? Eat only the foods you are told to eat. ? Limit how much fluid you take.  Do not drink alcohol.  Keep all follow-up visits as told by your doctor. This is important. Contact a doctor if:  You feel more like you may vomit.  You feel more tired.  Your headache gets worse.  You feel more confused.  You feel weaker.  Your symptoms go away and then they come back.  You have trouble following the diet instructions. Get help right away if:  You have a seizure.  You pass out.  You keep having watery poop.  You keep vomiting. Summary  Hyponatremia is when the amount of salt in your blood is too low.  When salt levels are low, you can have swelling throughout the body. The swelling mostly affects the brain.  Treatment depends on the cause. Treatment may include getting IV fluids, medicines, or not drinking as much fluid. This information is not intended to replace advice given to you by your health care provider. Make sure you discuss any questions you have with your health care provider. Document Released: 03/28/2011 Document Revised: 10/02/2018 Document Reviewed: 06/19/2018 Elsevier Patient Education  2020 [employer].    Alcohol Abuse and Dependence Information, Adult Alcohol is a widely available drug. People drink alcohol in different amounts. People who drink alcohol very often and in large amounts often have problems during and after  drinking. They may develop what is called an alcohol use disorder. There are two main types of alcohol use disorders:  Alcohol abuse. This is when you use alcohol too much or too often. You may use alcohol to make yourself feel happy or to reduce stress. You may have a hard time setting a limit on the amount you drink.  Alcohol dependence. This is when you use alcohol consistently for a period of time, and your body  changes as a result. This can make it hard to stop drinking because you may start to feel sick or feel different when you do not use alcohol. These symptoms are known as withdrawal. How can alcohol abuse and dependence affect me? Alcohol abuse and dependence can have a negative effect on your life. Drinking too much can lead to addiction. You may feel like you need alcohol to function normally. You may drink alcohol before work in the morning, during the day, or as soon as you get home from work in the evening. These actions can result in:  Poor work performance.  Job loss.  Financial problems.  Car crashes or criminal charges from driving after drinking alcohol.  Problems in your relationships with friends and family.  Losing the trust and respect of coworkers, friends, and family. Drinking heavily over a long period of time can permanently damage your body and brain, and can cause lifelong health issues, such as:  Damage to your liver or pancreas.  Heart problems, high blood pressure, or stroke.  Certain cancers.  Decreased ability to fight infections.  Brain or nerve damage.  Depression.  Early (premature) death. If you are careless or you crave alcohol, it is easy to drink more than your body can handle (overdose). Alcohol overdose is a serious situation that requires hospitalization. It may lead to permanent injuries or death. What can increase my risk?  Having a family history of alcohol abuse.  Having depression or other mental health conditions.  Beginning to drink at an early age.  Binge drinking often.  Experiencing trauma, stress, and an unstable home life during childhood.  Spending time with people who drink often. What actions can I take to prevent or manage alcohol abuse and dependence?  Do not drink alcohol if: ? Your health care provider tells you not to drink. ? You are pregnant, may be pregnant, or are planning to become pregnant.  If you drink  alcohol: ? Limit how much you use to:  0-1 drink a day for women.  0-2 drinks a day for men. ? Be aware of how much alcohol is in your drink. In the [place] one drink equals one 12 oz bottle of beer (355 mL), one 5 oz glass of wine (148 mL), or one 1 oz glass of hard liquor (44 mL).  Stop drinking if you have been drinking too much. This can be very hard to do if you are used to abusing alcohol. If you begin to have withdrawal symptoms, talk with your health care provider or a person that you trust. These symptoms may include anxiety, shaky hands, headache, nausea, sweating, or not being able to sleep.  Choose to drink nonalcoholic beverages in social gatherings and places where there may be alcohol. Activity  Spend more time on activities that you enjoy that do not involve alcohol, like hobbies or exercise.  Find healthy ways to cope with stress, such as exercise, meditation, or spending time with people you care about. General information  Talk to your  family, coworkers, and friends about supporting you in your efforts to stop drinking. If they drink, ask them not to drink around you. Spend more time with people who do not drink alcohol.  If you think that you have an alcohol dependency problem: ? Tell friends or family about your concerns. ? Talk with your health care provider or another health professional about where to get help. ? Work with a Transport planner and a Regulatory affairs officer. ? Consider joining a support group for people who struggle with alcohol abuse and dependence. Where to find support   Your health care provider.  SMART Recovery: www.smartrecovery.org Therapy and support groups  Local treatment centers or chemical dependency counselors.  Local AA groups in your community: NicTax.com.pt Where to find more information  Centers for Disease Control and Prevention: http://www.wolf.info/  National Institute on Alcohol Abuse and Alcoholism: http://www.bradshaw.com/  Alcoholics  Anonymous (AA): [employer]Tax.com.pt Contact a health care provider if:  You drank more or for longer than you intended on more than one occasion.  You tried to stop drinking or to cut back on how much you drink, but you were not able to.  You often drink to the point of vomiting or passing out.  You want to drink so badly that you cannot think about anything else.  You have problems in your life due to drinking, but you continue to drink.  You keep drinking even though you feel anxious, depressed, or have experienced memory loss.  You have stopped doing the things you used to enjoy in order to drink.  You have to drink more than you used to in order to get the effect you want.  You experience anxiety, sweating, nausea, shakiness, and trouble sleeping when you try to stop drinking. Get help right away if:  You have thoughts about hurting yourself or others.  You have serious withdrawal symptoms, including: ? Confusion. ? Racing heart. ? High blood pressure. ? Fever. If you ever feel like you may hurt yourself or others, or have thoughts about taking your own life, get help right away. You can go to your nearest emergency department or call:  Your local emergency services (911 in the [place]  A suicide crisis helpline, such as the [place] at (601)888-0023. This is open 24 hours a day. Summary  Alcohol abuse and dependence can have a negative effect on your life. Drinking too much or too often can lead to addiction.  If you drink alcohol, limit how much you use.  If you are having trouble keeping your drinking under control, find ways to change your behavior. Hobbies, calming activities, exercise, or support groups can help.  If you feel you need help with changing your drinking habits, talk with your health care provider, a good friend, or a therapist, or go to an [facility] group. This information is not intended to replace advice given to you by your health care  provider. Make sure you discuss any questions you have with your health care provider. Document Released: 07/10/2016 Document Revised: 11/04/2018 Document Reviewed: 09/23/2018 Elsevier Patient Education  2020 [employer].

## 2019-03-19 NOTE — Discharge Summary (Signed)
Physician Discharge Summary  [name]:[id number] DOB: [birth year] DOA: 03/13/2019  PCP: [name], NP  Admit date: 03/13/2019 Discharge date: 03/19/2019  Time spent: 45 minutes  Recommendations for Outpatient Follow-up:  Patient will be discharged to home.  Patient will need to follow up with primary care provider within one week of discharge, repeat BMP.  Patient should continue medications as prescribed.  Patient should follow a regular diet.   Discharge Diagnoses:  Severe hyponatremia  Hypokalemia Alcohol intoxication/alcohol use disorder with severe dependence Cough Atypical chest pain/epigastric pain/GERD/history of upper GI bleed Tobacco use disorder History of CAD Chronic COPD History of MDD/suicidal ideation Prolonged QTC History of CVA Social issues/homelessness Chronic headache Generalized weakness and deconditioning  Discharge Condition: Stable  Diet recommendation: regular  Filed Weights   03/18/19 0500 03/18/19 2143 03/19/19 0500  Weight: 66.4 kg 70.1 kg 70.1 kg    History of present illness:  on 03/13/2019 by Dr. [name] (PCCM) [name] is a 63 y/o gentleman who presents to the ED complaining of chest pain and nausea after having a fall at home. He drinks alcohol daily. He reports that the fall was due to losing his balance, and he hit the right side of his head when he fell. He has had intermittent chest pain for a few days.In the ED he was vomiting coffee ground emesis and was found to have a sodium of 108. He was given 1L NS. He has gone through DTs in previous admissions treated with PRN benzos.Currently his only complaints are central chest burning, which he attributes to his reflux, and nausea,which is improved. He admits to feeling weak over the last few days. He has been drinking about 30 beers a day for the last few days, and increased from his baseline of 12-15 beers per day. He continues to smoke 1 pack/day. He is on  interested in attempting to quit. He admits to not eating in the last few days.  Hospital Course:  Severe hyponatremia  -Likely secondary to poor oral intake as well as alcohol abuse -Upon admission, sodium was 108 -Patient was placed on IV fluids, sodium currently 134 -repeat BMP in one week  Hypokalemia -resolved   Alcohol intoxication/alcohol use disorder with severe dependence -Patient with history of DTs in the past -Continue CIWA protocol with Ativan, multivitamin, thiamine, folate -Social work consulted -dicussed alcohol abstinence  Cough -was complaining of nonproductive cough which is been ongoing for the last several days -today, no further complaints of cough -CXR unremarkable -speech therapy also evaluated patient- recommended regular solids, thin liquid, oral care BID  Atypical chest pain/epigastric pain/GERD/history of upper GI bleed -Patient on NSAIDs at home -Recommend avoiding NSAIDs -Troponins cycled and unremarkable -Hemoglobin currently stable -Continue PPI twice daily  Tobacco use disorder -Smokes approximately 1 pack/day -Continue nicotine patch -Smoking cessation discussed  History of CAD -Stable, currently no chest pain -Troponin unremarkable, EKG without acute findings -Outpatient follow-up -Currently not on any home medications  Chronic COPD -Stable, currently not on any oxygen -Continue albuterol as needed  History of MDD/suicidal ideation -Stable, currently denies any suicidal ideation or hallucinations -Continue Zoloft, trazodone -Outpatient psychiatry  Prolonged QTC -Borderline QTC, 485  History of CVA -Stable -Currently not on statin or aspirin (secondary to h/o GIB) -lipid panel: TC 107, HDL 54, LDL 46, TG 35  Social issues/homelessness -Social work consulted  Chronic headache -Continue Tylenol as needed  Generalized weakness and deconditioning -PT consulted, no further therapy  needs  Procedures: None  Consultations: PCCM  Discharge Exam: Vitals:   03/19/19 0511 03/19/19 0812  BP: (!) 153/87   Pulse: 86 92  Resp: 19 18  Temp: 98.9 F (37.2 C)   SpO2: 96%      General: Well developed, well nourished, NAD, appears stated age  HEENT: NCAT,  mucous membranes moist.  Cardiovascular: S1 S2 auscultated, RRR, no murmur  Respiratory: Diminished breath sounds  Abdomen: Soft, nontender, nondistended, + bowel sounds  Extremities: warm dry without cyanosis clubbing or edema  Neuro: AAOx3, nonfocal  Psych: Appropriate mood and affect, pleasant   Discharge Instructions Discharge Instructions    Discharge instructions   Complete by: As directed    Patient will be discharged to home.  Patient will need to follow up with primary care provider within one week of discharge, repeat BMP.  Patient should continue medications as prescribed.  Patient should follow a regular diet.     Allergies as of 03/19/2019      Reactions   Bee Venom Anaphylaxis   Penicillins Rash   Has patient had a PCN reaction causing immediate rash, facial/tongue/throat swelling, SOB or lightheadedness with hypotension: {Yes Has patient had a PCN reaction causing severe rash involving mucus membranes or skin necrosis: YES Has patient had a PCN reaction that required hospitalization Yes Has patient had a PCN reaction occurring within the last 10 years: YES If all of the above answers are "NO", then may proceed with Cephalosporin use.   Vancomycin Tinitus      Medication List    TAKE these medications   albuterol 108 (90 Base) MCG/ACT inhaler Commonly known as: VENTOLIN HFA Inhale 2 puffs into the lungs every 4 (four) hours as needed for wheezing or shortness of breath.   folic acid 1 MG tablet Commonly known as: FOLVITE Take 1 tablet (1 mg total) by mouth daily.   multivitamin with minerals Tabs tablet Take 1 tablet by mouth daily.   nicotine 14 mg/24hr patch Commonly known  as: NICODERM CQ - dosed in mg/24 hours Place 1 patch (14 mg total) onto the skin daily.   pantoprazole 40 MG tablet Commonly known as: PROTONIX Take 1 tablet (40 mg total) by mouth 2 (two) times daily. For reflux What changed: when to take this   sertraline 50 MG tablet Commonly known as: ZOLOFT Take 1 tablet (50 mg total) by mouth daily. For mood   thiamine 100 MG tablet Take 1 tablet (100 mg total) by mouth daily.   traZODone 50 MG tablet Commonly known as: DESYREL Take 2 tablets (100 mg total) by mouth at bedtime and may repeat dose one time if needed. For sleep      Allergies  Allergen Reactions   Bee Venom Anaphylaxis   Penicillins Rash    Has patient had a PCN reaction causing immediate rash, facial/tongue/throat swelling, SOB or lightheadedness with hypotension: {Yes Has patient had a PCN reaction causing severe rash involving mucus membranes or skin necrosis: YES Has patient had a PCN reaction that required hospitalization Yes Has patient had a PCN reaction occurring within the last 10 years: YES If all of the above answers are "NO", then may proceed with Cephalosporin use.    Vancomycin Tinitus   Follow-up Information    [name], NP. Schedule an appointment as soon as possible for a visit in 1 week(s).   Specialty: Internal Medicine Why: Hospital follow up Contact information: [place] [address] [phone]  The results of significant diagnostics from this hospitalization (including imaging, microbiology, ancillary and laboratory) are listed below for reference.    Significant Diagnostic Studies: Dg Chest 2 View  Result Date: 03/18/2019 CLINICAL DATA:  Chest pain, cough EXAM: CHEST - 2 VIEW COMPARISON:  03/13/2019 FINDINGS: The heart size and mediastinal contours are within normal limits. Both lungs are clear. The visualized skeletal structures are unremarkable. IMPRESSION: No acute abnormality of the lungs.  Electronically Signed   By: Eddie Candle M.D.   On: 03/18/2019 09:42   Dg Chest 2 View  Result Date: 03/13/2019 CLINICAL DATA:  Chest pain EXAM: CHEST - 2 VIEW COMPARISON:  12/31/2018 FINDINGS: The heart size and mediastinal contours are within normal limits. Both lungs are clear. Clips at the GE junction and left upper quadrant. IMPRESSION: No active cardiopulmonary disease. Electronically Signed   By: Donavan Foil M.D.   On: 03/13/2019 13:56   Ct Head Wo Contrast  Result Date: 03/13/2019 CLINICAL DATA:  Head laceration after fall. EXAM: CT HEAD WITHOUT CONTRAST CT MAXILLOFACIAL WITHOUT CONTRAST CT CERVICAL SPINE WITHOUT CONTRAST TECHNIQUE: Multidetector CT imaging of the head, cervical spine, and maxillofacial structures were performed using the standard protocol without intravenous contrast. Multiplanar CT image reconstructions of the cervical spine and maxillofacial structures were also generated. COMPARISON:  None. FINDINGS: CT HEAD FINDINGS Brain: No evidence of acute infarction, hemorrhage, hydrocephalus, extra-axial collection or mass lesion/mass effect. Vascular: No hyperdense vessel or unexpected calcification. Skull: Normal. Negative for fracture or focal lesion. Other: None. CT MAXILLOFACIAL FINDINGS Osseous: No fracture or mandibular dislocation. No destructive process. Orbits: Negative. No traumatic or inflammatory finding. Sinuses: Clear. Soft tissues: Large right infraorbital soft tissue hematoma is noted. CT CERVICAL SPINE FINDINGS Alignment: Grade 1 anterolisthesis of C2-3 is noted secondary to posterior facet joint hypertrophy. Skull base and vertebrae: No acute fracture. No primary bone lesion or focal pathologic process. Soft tissues and spinal canal: No prevertebral fluid or swelling. No visible canal hematoma. Disc levels: Severe degenerative disc disease is noted at C3-4, C4-5 and C6-7. There appears to be fusion of the C5 and C6 vertebra which most likely is degenerative in  etiology. Upper chest: Negative. Other: None. IMPRESSION: Normal head CT. Large right infraorbital soft tissue hematoma is noted. No definite osseous abnormality is noted in the maxillofacial region. Severe multilevel degenerative disc disease is noted in the cervical spine. No fracture or other acute abnormality is noted. Electronically Signed   By: Marijo Conception M.D.   On: 03/13/2019 14:59   Ct Cervical Spine Wo Contrast  Result Date: 03/13/2019 CLINICAL DATA:  Head laceration after fall. EXAM: CT HEAD WITHOUT CONTRAST CT MAXILLOFACIAL WITHOUT CONTRAST CT CERVICAL SPINE WITHOUT CONTRAST TECHNIQUE: Multidetector CT imaging of the head, cervical spine, and maxillofacial structures were performed using the standard protocol without intravenous contrast. Multiplanar CT image reconstructions of the cervical spine and maxillofacial structures were also generated. COMPARISON:  None. FINDINGS: CT HEAD FINDINGS Brain: No evidence of acute infarction, hemorrhage, hydrocephalus, extra-axial collection or mass lesion/mass effect. Vascular: No hyperdense vessel or unexpected calcification. Skull: Normal. Negative for fracture or focal lesion. Other: None. CT MAXILLOFACIAL FINDINGS Osseous: No fracture or mandibular dislocation. No destructive process. Orbits: Negative. No traumatic or inflammatory finding. Sinuses: Clear. Soft tissues: Large right infraorbital soft tissue hematoma is noted. CT CERVICAL SPINE FINDINGS Alignment: Grade 1 anterolisthesis of C2-3 is noted secondary to posterior facet joint hypertrophy. Skull base and vertebrae: No acute fracture. No primary bone lesion or focal pathologic process.  Soft tissues and spinal canal: No prevertebral fluid or swelling. No visible canal hematoma. Disc levels: Severe degenerative disc disease is noted at C3-4, C4-5 and C6-7. There appears to be fusion of the C5 and C6 vertebra which most likely is degenerative in etiology. Upper chest: Negative. Other: None.  IMPRESSION: Normal head CT. Large right infraorbital soft tissue hematoma is noted. No definite osseous abnormality is noted in the maxillofacial region. Severe multilevel degenerative disc disease is noted in the cervical spine. No fracture or other acute abnormality is noted. Electronically Signed   By: Marijo Conception M.D.   On: 03/13/2019 14:59   Dg Swallowing Func-speech Pathology  Result Date: 03/18/2019 Objective Swallowing Evaluation: Type of Study: MBS-Modified Barium Swallow Study  Patient Details Name: Samuel Reyes MRN: [id number] Date of Birth: August 30, 1955 Today's Date: 03/18/2019 Time: SLP Start Time (ACUTE ONLY): 0905 -SLP Stop Time (ACUTE ONLY): 0930 SLP Time Calculation (min) (ACUTE ONLY): 25 min Past Medical History: Past Medical History: Diagnosis Date  Alcoholism ([place])   Barrett's esophagus   CAD (coronary artery disease) 2002  MI, no intervention required  COPD (chronic obstructive pulmonary disease) ([place])   not on home O2  Depression   DTs (delirium tremens) ([place])   GERD (gastroesophageal reflux disease)   Headache   Hypertension   Stroke ([place]) 2006 Past Surgical History: Past Surgical History: Procedure Laterality Date  BACK SURGERY    3 cervical spine surgeries C4-C5 fused  COLONOSCOPY N/A 01/04/2014  Procedure: COLONOSCOPY;  Surgeon: Danie Binder, MD;  Location: [facility];  Service: Endoscopy;  Laterality: N/A;  1:45  ESOPHAGOGASTRODUODENOSCOPY N/A 01/04/2014  Procedure: ESOPHAGOGASTRODUODENOSCOPY (EGD);  Surgeon: Danie Binder, MD;  Location: [facility];  Service: Endoscopy;  Laterality: N/A;  FINGER SURGERY Left   2nd, 3rd, & 4th fingers were cut off by table saw and reattached  GASTRECTOMY    HERNIA REPAIR    INCISIONAL HERNIA REPAIR N/A 01/20/2014  Procedure: LAPAROSCOPIC RECURRENT  INCISIONAL HERNIA with mesh;  Surgeon: Edward Jolly, MD;  Location: [facility] ORS;  Service: General;  Laterality: N/A;  rt knee arthroscopic surgery    SHOULDER SURGERY Bilateral    3 surgeries on on left, 2 surgeries on right  HPI: 63 year old male with history of CAD, COPD not on oxygen, unspecified type of CVA, hypertension, hypertension, MDD with suicidal attempt and previous psych hospitalization, GERD, upper GI bleed, tobacco use disorder and alcohol use disorder presented with chest and epigastric pain with nausea and fall after alcohol intoxication.  He was admitted to ICU for severe hyponatremia with sodium of 108.  He was treated with IV fluids.  After hyponatremia improved, he was transferred to [facility] care on 03/15/2019. Clinical swallow eval January of 2018 with recs for regular diet, thin liquids, and GI f/u given long-term issues with reflux.  Barrett's esophagus dx'd in 2015.  Presence of endplate osteophytes at multiple levels. Severe degenerative disc disease C3-4, C4-5 and C6-7.  Fusion of C5 and 6 vertebra, degenerative in etiology per CT cervical spine this admission.  Subjective: alert, communicative Assessment / Plan / Recommendation CHL IP CLINICAL IMPRESSIONS 03/18/2019 Clinical Impression Pt presents with quite normal oropharyngeal swallow, presenting with no dysphagia, no coughing during study.  There was adequate oral mastication and oral control, good timing of swallow onset, excellent pharyngeal clearance with no residue-post swallow, and no penetration nor aspiration.  Brief scan of esophagus revealed barium retention in distal esophagus.  Reviewed video with pt in real time -  recommend continuing regular solids, thin liquids.  Discussed basic precautions with pt to improve comfort and decrease coughing episodes.  Pt agrees.  No f/u needed. SLP to sign off.  SLP Visit Diagnosis Dysphagia, unspecified (R13.10) Attention and concentration deficit following -- Frontal lobe and executive function deficit following -- Impact on safety and function No limitations   CHL IP TREATMENT RECOMMENDATION 03/18/2019 Treatment Recommendations No treatment recommended at this time   No  flowsheet data found. CHL IP DIET RECOMMENDATION 03/18/2019 SLP Diet Recommendations Regular solids;Thin liquid Liquid Administration via Cup;Straw Medication Administration Whole meds with liquid Compensations -- Postural Changes Seated upright at 90 degrees   CHL IP OTHER RECOMMENDATIONS 03/18/2019 Recommended Consults -- Oral Care Recommendations Oral care BID Other Recommendations --   CHL IP FOLLOW UP RECOMMENDATIONS 03/18/2019 Follow up Recommendations None   CHL IP FREQUENCY AND DURATION 03/17/2019 Speech Therapy Frequency (ACUTE ONLY) min 2x/week Treatment Duration 1 week      CHL IP ORAL PHASE 03/18/2019 Oral Phase WFL Oral - Pudding Teaspoon -- Oral - Pudding Cup -- Oral - Honey Teaspoon -- Oral - Honey Cup -- Oral - Nectar Teaspoon -- Oral - Nectar Cup -- Oral - Nectar Straw -- Oral - Thin Teaspoon -- Oral - Thin Cup -- Oral - Thin Straw -- Oral - Puree -- Oral - Mech Soft -- Oral - Regular -- Oral - Multi-Consistency -- Oral - Pill -- Oral Phase - Comment --  CHL IP PHARYNGEAL PHASE 03/18/2019 Pharyngeal Phase WFL Pharyngeal- Pudding Teaspoon -- Pharyngeal -- Pharyngeal- Pudding Cup -- Pharyngeal -- Pharyngeal- Honey Teaspoon -- Pharyngeal -- Pharyngeal- Honey Cup -- Pharyngeal -- Pharyngeal- Nectar Teaspoon -- Pharyngeal -- Pharyngeal- Nectar Cup -- Pharyngeal -- Pharyngeal- Nectar Straw -- Pharyngeal -- Pharyngeal- Thin Teaspoon -- Pharyngeal -- Pharyngeal- Thin Cup -- Pharyngeal -- Pharyngeal- Thin Straw -- Pharyngeal -- Pharyngeal- Puree -- Pharyngeal -- Pharyngeal- Mechanical Soft -- Pharyngeal -- Pharyngeal- Regular -- Pharyngeal -- Pharyngeal- Multi-consistency -- Pharyngeal -- Pharyngeal- Pill -- Pharyngeal -- Pharyngeal Comment --  CHL IP CERVICAL ESOPHAGEAL PHASE 03/18/2019 Cervical Esophageal Phase WFL Pudding Teaspoon -- Pudding Cup -- Honey Teaspoon -- Honey Cup -- Nectar Teaspoon -- Nectar Cup -- Nectar Straw -- Thin Teaspoon -- Thin Cup -- Thin Straw -- Puree -- Mechanical Soft -- Regular --  Multi-consistency -- Pill -- Cervical Esophageal Comment -- [name] 03/18/2019, 9:49 AM              Ct Maxillofacial Wo Contrast  Result Date: 03/13/2019 CLINICAL DATA:  Head laceration after fall. EXAM: CT HEAD WITHOUT CONTRAST CT MAXILLOFACIAL WITHOUT CONTRAST CT CERVICAL SPINE WITHOUT CONTRAST TECHNIQUE: Multidetector CT imaging of the head, cervical spine, and maxillofacial structures were performed using the standard protocol without intravenous contrast. Multiplanar CT image reconstructions of the cervical spine and maxillofacial structures were also generated. COMPARISON:  None. FINDINGS: CT HEAD FINDINGS Brain: No evidence of acute infarction, hemorrhage, hydrocephalus, extra-axial collection or mass lesion/mass effect. Vascular: No hyperdense vessel or unexpected calcification. Skull: Normal. Negative for fracture or focal lesion. Other: None. CT MAXILLOFACIAL FINDINGS Osseous: No fracture or mandibular dislocation. No destructive process. Orbits: Negative. No traumatic or inflammatory finding. Sinuses: Clear. Soft tissues: Large right infraorbital soft tissue hematoma is noted. CT CERVICAL SPINE FINDINGS Alignment: Grade 1 anterolisthesis of C2-3 is noted secondary to posterior facet joint hypertrophy. Skull base and vertebrae: No acute fracture. No primary bone lesion or focal pathologic process. Soft tissues and spinal canal: No prevertebral fluid or swelling. No  visible canal hematoma. Disc levels: Severe degenerative disc disease is noted at C3-4, C4-5 and C6-7. There appears to be fusion of the C5 and C6 vertebra which most likely is degenerative in etiology. Upper chest: Negative. Other: None. IMPRESSION: Normal head CT. Large right infraorbital soft tissue hematoma is noted. No definite osseous abnormality is noted in the maxillofacial region. Severe multilevel degenerative disc disease is noted in the cervical spine. No fracture or other acute abnormality is noted.  Electronically Signed   By: Marijo Conception M.D.   On: 03/13/2019 14:59    Microbiology: Recent Results (from the past 240 hour(s))  SARS Coronavirus 2 [facility] order, Performed in [facility] hospital lab) Nasopharyngeal Nasopharyngeal Swab     Status: None   Collection Time: 03/13/19  3:50 PM   Specimen: Nasopharyngeal Swab  Result Value Ref Range Status   SARS Coronavirus 2 NEGATIVE NEGATIVE Final    Comment: (NOTE) If result is NEGATIVE SARS-CoV-2 target nucleic acids are NOT DETECTED. The SARS-CoV-2 RNA is generally detectable in upper and lower  respiratory specimens during the acute phase of infection. The lowest  concentration of SARS-CoV-2 viral copies this assay can detect is 250  copies / mL. A negative result does not preclude SARS-CoV-2 infection  and should not be used as the sole basis for treatment or other  patient management decisions.  A negative result may occur with  improper specimen collection / handling, submission of specimen other  than nasopharyngeal swab, presence of viral mutation(s) within the  areas targeted by this assay, and inadequate number of viral copies  (<250 copies / mL). A negative result must be combined with clinical  observations, patient history, and epidemiological information. If result is POSITIVE SARS-CoV-2 target nucleic acids are DETECTED. The SARS-CoV-2 RNA is generally detectable in upper and lower  respiratory specimens dur ing the acute phase of infection.  Positive  results are indicative of active infection with SARS-CoV-2.  Clinical  correlation with patient history and other diagnostic information is  necessary to determine patient infection status.  Positive results do  not rule out bacterial infection or co-infection with other viruses. If result is PRESUMPTIVE POSTIVE SARS-CoV-2 nucleic acids MAY BE PRESENT.   A presumptive positive result was obtained on the submitted specimen  and confirmed on repeat testing.  While  2019 novel coronavirus  (SARS-CoV-2) nucleic acids may be present in the submitted sample  additional confirmatory testing may be necessary for epidemiological  and / or clinical management purposes  to differentiate between  SARS-CoV-2 and other Sarbecovirus currently known to infect humans.  If clinically indicated additional testing with an alternate test  methodology [phone]) is advised. The SARS-CoV-2 RNA is generally  detectable in upper and lower respiratory sp ecimens during the acute  phase of infection. The expected result is Negative. Fact Sheet for Patients:  StrictlyIdeas.no Fact Sheet for Healthcare Providers: BankingDealers.co.za This test is not yet approved or cleared by the [place] FDA and has been authorized for detection and/or diagnosis of SARS-CoV-2 by FDA under an Emergency Use Authorization (EUA).  This EUA will remain in effect (meaning this test can be used) for the duration of the COVID-19 declaration under Section 564(b)(1) of the Act, 21 U.S.C. section 360bbb-3(b)(1), unless the authorization is terminated or revoked sooner. Performed at [facility], [place] [address], [place], [place] [address]      Labs: Basic Metabolic Panel: Recent Labs  Lab 03/13/19 2127  03/14/19 [phone]  03/15/19 323 118 3020  03/15/19 1430  03/15/19 2233 03/16/19 0422 03/17/19 0530 03/18/19 0601 03/19/19 0325  NA 112*   < > 115*   < > 126*   < > 125*   < > 125* 129* 132* 132* 134*  K 3.0*   < > 4.0   < > 3.6   < > 3.7  --   --  3.4* 4.5 4.2 3.9  CL 68*   < > 73*   < > 88*   < > 87*  --   --  91* 100 100 101  CO2 26   < > 26   < > 28   < > 27  --   --  26 23 24 24   GLUCOSE 78   < > 70   < > 121*   < > 95  --   --  114* 97 101* 99  BUN 6*   < > 6*   < > <5*   < > 9  --   --  9 10 9 10   CREATININE 0.61   < > 0.77   < > 0.72   < > 0.92  --   --  0.74 0.71 0.62 0.75  CALCIUM 7.3*   < > 7.8*   < > 8.0*   < > 8.4*  --   --  8.3*  8.1* 8.1* 8.1*  MG 2.2  --  2.2  --  2.3  --   --   --   --  1.9 1.7 1.7 1.9  PHOS 2.1*  --  2.0*  --  2.7  --   --   --   --  2.9  --   --   --    < > = values in this interval not displayed.   Liver Function Tests: Recent Labs  Lab 03/13/19 1248 03/16/19 0422 03/17/19 0530  AST 35 18 21  ALT 17 17 17   ALKPHOS 89 81 74  BILITOT 0.8 0.5 0.5  PROT 6.2* 5.7* 5.3*  ALBUMIN 3.4* 2.8* 2.6*   Recent Labs  Lab 03/13/19 1248  LIPASE 26   No results for input(s): AMMONIA in the last 168 hours. CBC: Recent Labs  Lab 03/13/19 1248 03/13/19 2127 03/14/19 0634 03/15/19 0643 03/16/19 0422 03/17/19 0530  WBC 16.6* 16.0* 15.1* 10.5 11.0* 8.0  NEUTROABS 13.0*  --   --   --   --  5.3  HGB 13.0 12.4* 12.6* 12.8* 12.3* 11.6*  HCT 37.0* 34.2* 36.0* 38.6* 37.1* 36.0*  MCV 77.2* 76.0* 77.9* 81.3 82.1 85.1  PLT 253 210 196 204 180 212   Cardiac Enzymes: No results for input(s): CKTOTAL, CKMB, CKMBINDEX, TROPONINI in the last 168 hours. BNP: BNP (last 3 results) No results for input(s): BNP in the last 8760 hours.  ProBNP (last 3 results) No results for input(s): PROBNP in the last 8760 hours.  CBG: Recent Labs  Lab 03/13/19 1940  GLUCAP 5       Signed:  Yvan Dority  [facility] 03/19/2019, 9:22 AM

## 2019-03-19 NOTE — Progress Notes (Signed)
DISCHARGE NOTE HOME Samuel Reyes to be discharged home per MD order. Discussed prescriptions and follow up appointments with the patient. Prescriptions given to patient; medication list explained in detail. Patient verbalized understanding.  Skin clean, dry and intact without evidence of skin break down, no evidence of skin tears noted. IV catheter discontinued intact. Site without signs and symptoms of complications. Dressing and pressure applied. Pt denies pain at the site currently. No complaints noted.  Patient free of lines, drains, and wounds.   An After Visit Summary (AVS) was printed and given to the patient. Patient escorted via wheelchair, and discharged home via private auto.  [place], Zenon Mayo, RN

## 2019-03-19 NOTE — Progress Notes (Signed)
PT Cancellation Note  Patient Details Name: Samuel Reyes MRN: [id number] DOB: 1956/03/23   Cancelled Treatment:    Reason Eval/Treat Not Completed: Other (comment).  Declined PT and is expecting a discharge to home imminently.  Follow up tomorrow if pt is not released.   Ramond Dial 03/19/2019, 3:23 PM   Mee Hives, PT MS Acute Rehab Dept. Number: [place] [facility] [place]

## 2019-03-24 ENCOUNTER — Other Ambulatory Visit: Payer: Self-pay

## 2019-03-24 ENCOUNTER — Encounter (HOSPITAL_COMMUNITY): Payer: Self-pay | Admitting: Emergency Medicine

## 2019-03-24 ENCOUNTER — Inpatient Hospital Stay (HOSPITAL_COMMUNITY)
Admission: EM | Admit: 2019-03-24 | Discharge: 2019-03-31 | DRG: 897 | Disposition: A | Discharge status: Other Acute Inpatient Hospital | Payer: Medicaid Other | Attending: Internal Medicine | Admitting: Internal Medicine

## 2019-03-24 DIAGNOSIS — Z20828 Contact with and (suspected) exposure to other viral communicable diseases: Secondary | ICD-10-CM | POA: Diagnosis present

## 2019-03-24 DIAGNOSIS — F10931 Alcohol use, unspecified with withdrawal delirium: Secondary | ICD-10-CM

## 2019-03-24 DIAGNOSIS — D649 Anemia, unspecified: Secondary | ICD-10-CM | POA: Diagnosis present

## 2019-03-24 DIAGNOSIS — I252 Old myocardial infarction: Secondary | ICD-10-CM

## 2019-03-24 DIAGNOSIS — Z880 Allergy status to penicillin: Secondary | ICD-10-CM

## 2019-03-24 DIAGNOSIS — E871 Hypo-osmolality and hyponatremia: Secondary | ICD-10-CM | POA: Diagnosis present

## 2019-03-24 DIAGNOSIS — Z915 Personal history of self-harm: Secondary | ICD-10-CM

## 2019-03-24 DIAGNOSIS — G4700 Insomnia, unspecified: Secondary | ICD-10-CM | POA: Diagnosis present

## 2019-03-24 DIAGNOSIS — F10231 Alcohol dependence with withdrawal delirium: Secondary | ICD-10-CM

## 2019-03-24 DIAGNOSIS — Z720 Tobacco use: Secondary | ICD-10-CM | POA: Diagnosis present

## 2019-03-24 DIAGNOSIS — Z8673 Personal history of transient ischemic attack (TIA), and cerebral infarction without residual deficits: Secondary | ICD-10-CM

## 2019-03-24 DIAGNOSIS — R296 Repeated falls: Secondary | ICD-10-CM | POA: Diagnosis present

## 2019-03-24 DIAGNOSIS — R45851 Suicidal ideations: Secondary | ICD-10-CM | POA: Diagnosis present

## 2019-03-24 DIAGNOSIS — R112 Nausea with vomiting, unspecified: Secondary | ICD-10-CM | POA: Diagnosis present

## 2019-03-24 DIAGNOSIS — L551 Sunburn of second degree: Secondary | ICD-10-CM | POA: Diagnosis present

## 2019-03-24 DIAGNOSIS — F329 Major depressive disorder, single episode, unspecified: Secondary | ICD-10-CM | POA: Diagnosis present

## 2019-03-24 DIAGNOSIS — F32A Depression, unspecified: Secondary | ICD-10-CM | POA: Diagnosis present

## 2019-03-24 DIAGNOSIS — L8990 Pressure ulcer of unspecified site, unspecified stage: Secondary | ICD-10-CM | POA: Diagnosis not present

## 2019-03-24 DIAGNOSIS — F10939 Alcohol use, unspecified with withdrawal, unspecified: Secondary | ICD-10-CM | POA: Diagnosis present

## 2019-03-24 DIAGNOSIS — Z91030 Bee allergy status: Secondary | ICD-10-CM

## 2019-03-24 DIAGNOSIS — L559 Sunburn, unspecified: Secondary | ICD-10-CM | POA: Diagnosis present

## 2019-03-24 DIAGNOSIS — R1013 Epigastric pain: Secondary | ICD-10-CM | POA: Diagnosis present

## 2019-03-24 DIAGNOSIS — D75839 Thrombocytosis, unspecified: Secondary | ICD-10-CM | POA: Diagnosis present

## 2019-03-24 DIAGNOSIS — Z5900 Homelessness unspecified: Secondary | ICD-10-CM

## 2019-03-24 DIAGNOSIS — Z825 Family history of asthma and other chronic lower respiratory diseases: Secondary | ICD-10-CM

## 2019-03-24 DIAGNOSIS — F1094 Alcohol use, unspecified with alcohol-induced mood disorder: Secondary | ICD-10-CM

## 2019-03-24 DIAGNOSIS — Z818 Family history of other mental and behavioral disorders: Secondary | ICD-10-CM

## 2019-03-24 DIAGNOSIS — I10 Essential (primary) hypertension: Secondary | ICD-10-CM | POA: Diagnosis present

## 2019-03-24 DIAGNOSIS — Z823 Family history of stroke: Secondary | ICD-10-CM

## 2019-03-24 DIAGNOSIS — J449 Chronic obstructive pulmonary disease, unspecified: Secondary | ICD-10-CM | POA: Diagnosis present

## 2019-03-24 DIAGNOSIS — F17210 Nicotine dependence, cigarettes, uncomplicated: Secondary | ICD-10-CM | POA: Diagnosis present

## 2019-03-24 DIAGNOSIS — D72829 Elevated white blood cell count, unspecified: Secondary | ICD-10-CM | POA: Diagnosis present

## 2019-03-24 DIAGNOSIS — F1024 Alcohol dependence with alcohol-induced mood disorder: Secondary | ICD-10-CM | POA: Diagnosis present

## 2019-03-24 DIAGNOSIS — D473 Essential (hemorrhagic) thrombocythemia: Secondary | ICD-10-CM | POA: Diagnosis present

## 2019-03-24 DIAGNOSIS — K219 Gastro-esophageal reflux disease without esophagitis: Secondary | ICD-10-CM | POA: Diagnosis present

## 2019-03-24 DIAGNOSIS — F10239 Alcohol dependence with withdrawal, unspecified: Principal | ICD-10-CM | POA: Diagnosis present

## 2019-03-24 DIAGNOSIS — I2510 Atherosclerotic heart disease of native coronary artery without angina pectoris: Secondary | ICD-10-CM | POA: Diagnosis present

## 2019-03-24 HISTORY — DX: Homelessness unspecified: Z59.00

## 2019-03-24 NOTE — ED Triage Notes (Signed)
Patient arrived with EMS from street ( homeless) with 2nd degree skin sunburn with redness/blisters , dry scabs at face and lower legs . Respirations unlabored /ambulatory .

## 2019-03-25 ENCOUNTER — Emergency Department (HOSPITAL_COMMUNITY): Payer: Medicaid Other

## 2019-03-25 DIAGNOSIS — E871 Hypo-osmolality and hyponatremia: Secondary | ICD-10-CM

## 2019-03-25 DIAGNOSIS — L559 Sunburn, unspecified: Secondary | ICD-10-CM | POA: Diagnosis present

## 2019-03-25 DIAGNOSIS — D75839 Thrombocytosis, unspecified: Secondary | ICD-10-CM | POA: Diagnosis present

## 2019-03-25 DIAGNOSIS — Z590 Homelessness: Secondary | ICD-10-CM | POA: Diagnosis not present

## 2019-03-25 DIAGNOSIS — F10239 Alcohol dependence with withdrawal, unspecified: Secondary | ICD-10-CM | POA: Diagnosis not present

## 2019-03-25 DIAGNOSIS — D72829 Elevated white blood cell count, unspecified: Secondary | ICD-10-CM | POA: Diagnosis present

## 2019-03-25 DIAGNOSIS — F329 Major depressive disorder, single episode, unspecified: Secondary | ICD-10-CM

## 2019-03-25 DIAGNOSIS — R1013 Epigastric pain: Secondary | ICD-10-CM | POA: Diagnosis not present

## 2019-03-25 DIAGNOSIS — L8990 Pressure ulcer of unspecified site, unspecified stage: Secondary | ICD-10-CM | POA: Diagnosis not present

## 2019-03-25 DIAGNOSIS — R112 Nausea with vomiting, unspecified: Secondary | ICD-10-CM

## 2019-03-25 DIAGNOSIS — D473 Essential (hemorrhagic) thrombocythemia: Secondary | ICD-10-CM

## 2019-03-25 LAB — CBC WITH DIFFERENTIAL/PLATELET
Abs Immature Granulocytes: 0.15 10*3/uL — ABNORMAL HIGH (ref 0.00–0.07)
Basophils Absolute: 0.1 10*3/uL (ref 0.0–0.1)
Basophils Relative: 0 %
Eosinophils Absolute: 0 10*3/uL (ref 0.0–0.5)
Eosinophils Relative: 0 %
HCT: 37.5 % — ABNORMAL LOW (ref 39.0–52.0)
Hemoglobin: 12.1 g/dL — ABNORMAL LOW (ref 13.0–17.0)
Immature Granulocytes: 1 %
Lymphocytes Relative: 12 %
Lymphs Abs: 1.5 10*3/uL (ref 0.7–4.0)
MCH: 27.9 pg (ref 26.0–34.0)
MCHC: 32.3 g/dL (ref 30.0–36.0)
MCV: 86.6 fL (ref 80.0–100.0)
Monocytes Absolute: 1.1 10*3/uL — ABNORMAL HIGH (ref 0.1–1.0)
Monocytes Relative: 9 %
Neutro Abs: 9.5 10*3/uL — ABNORMAL HIGH (ref 1.7–7.7)
Neutrophils Relative %: 78 %
Platelets: 532 10*3/uL — ABNORMAL HIGH (ref 150–400)
RBC: 4.33 MIL/uL (ref 4.22–5.81)
RDW: 19.1 % — ABNORMAL HIGH (ref 11.5–15.5)
WBC: 12.4 10*3/uL — ABNORMAL HIGH (ref 4.0–10.5)
nRBC: 0 % (ref 0.0–0.2)

## 2019-03-25 LAB — COMPREHENSIVE METABOLIC PANEL
ALT: 31 U/L (ref 0–44)
AST: 28 U/L (ref 15–41)
Albumin: 3.3 g/dL — ABNORMAL LOW (ref 3.5–5.0)
Alkaline Phosphatase: 90 U/L (ref 38–126)
Anion gap: 12 (ref 5–15)
BUN: 9 mg/dL (ref 8–23)
CO2: 20 mmol/L — ABNORMAL LOW (ref 22–32)
Calcium: 8.3 mg/dL — ABNORMAL LOW (ref 8.9–10.3)
Chloride: 102 mmol/L (ref 98–111)
Creatinine, Ser: 0.82 mg/dL (ref 0.61–1.24)
GFR calc Af Amer: >60 mL/min (ref >60)
GFR calc non Af Amer: >60 mL/min (ref >60)
Glucose, Bld: 90 mg/dL (ref 70–99)
Potassium: 4.4 mmol/L (ref 3.5–5.1)
Sodium: 134 mmol/L — ABNORMAL LOW (ref 135–145)
Total Bilirubin: 0.8 mg/dL (ref 0.3–1.2)
Total Protein: 6.4 g/dL — ABNORMAL LOW (ref 6.5–8.1)

## 2019-03-25 LAB — TROPONIN I (HIGH SENSITIVITY)
Troponin I (High Sensitivity): 8 ng/L (ref <18)
Troponin I (High Sensitivity): 11 ng/L (ref <18)

## 2019-03-25 LAB — SARS CORONAVIRUS 2 (TAT 6-24 HRS): SARS Coronavirus 2: NEGATIVE

## 2019-03-25 LAB — MAGNESIUM: Magnesium: 1.9 mg/dL (ref 1.7–2.4)

## 2019-03-25 LAB — LIPASE, BLOOD: Lipase: 21 U/L (ref 11–51)

## 2019-03-25 MED ORDER — LORAZEPAM 1 MG PO TABS
0.0000 mg | ORAL_TABLET | Freq: Two times a day (BID) | ORAL | Status: AC
  Administered 2019-03-27: 1 mg via ORAL
  Filled 2019-03-25: qty 1

## 2019-03-25 MED ORDER — ENOXAPARIN SODIUM 40 MG/0.4ML ~~LOC~~ SOLN
40.0000 mg | SUBCUTANEOUS | Status: DC
  Administered 2019-03-25 – 2019-03-31 (×7): 40 mg via SUBCUTANEOUS
  Filled 2019-03-25 (×7): qty 0.4

## 2019-03-25 MED ORDER — SODIUM CHLORIDE 0.9 % IV SOLN
Freq: Once | INTRAVENOUS | Status: AC
  Administered 2019-03-25: 11:00:00 via INTRAVENOUS

## 2019-03-25 MED ORDER — LORAZEPAM 2 MG/ML IJ SOLN
2.0000 mg | Freq: Once | INTRAMUSCULAR | Status: AC
  Administered 2019-03-25: 04:00:00 2 mg via INTRAVENOUS

## 2019-03-25 MED ORDER — VITAMIN B-1 100 MG PO TABS
100.0000 mg | ORAL_TABLET | Freq: Every day | ORAL | Status: DC
  Administered 2019-03-25 – 2019-03-31 (×7): 100 mg via ORAL
  Filled 2019-03-25 (×7): qty 1

## 2019-03-25 MED ORDER — MORPHINE SULFATE (PF) 4 MG/ML IV SOLN
4.0000 mg | Freq: Once | INTRAVENOUS | Status: AC
  Administered 2019-03-25: 4 mg via INTRAVENOUS
  Filled 2019-03-25: qty 1

## 2019-03-25 MED ORDER — SODIUM CHLORIDE 0.9% FLUSH
3.0000 mL | Freq: Two times a day (BID) | INTRAVENOUS | Status: DC
  Administered 2019-03-25 – 2019-03-31 (×10): 3 mL via INTRAVENOUS

## 2019-03-25 MED ORDER — MUPIROCIN 2 % EX OINT
TOPICAL_OINTMENT | Freq: Two times a day (BID) | CUTANEOUS | Status: DC
  Administered 2019-03-25: 14:00:00 via TOPICAL
  Administered 2019-03-25: 1 via TOPICAL
  Administered 2019-03-26 – 2019-03-29 (×8): via TOPICAL
  Administered 2019-03-30: 1 via TOPICAL
  Administered 2019-03-30 – 2019-03-31 (×2): via TOPICAL
  Filled 2019-03-25 (×4): qty 22

## 2019-03-25 MED ORDER — LORAZEPAM 1 MG PO TABS
0.0000 mg | ORAL_TABLET | Freq: Four times a day (QID) | ORAL | Status: AC
  Administered 2019-03-26 (×2): 1 mg via ORAL
  Administered 2019-03-26: 2 mg via ORAL
  Filled 2019-03-25: qty 2
  Filled 2019-03-25 (×2): qty 1

## 2019-03-25 MED ORDER — THIAMINE HCL 100 MG/ML IJ SOLN: 100.0000 mg | Freq: Every day | INTRAMUSCULAR | Status: DC

## 2019-03-25 MED ORDER — ACETAMINOPHEN 650 MG RE SUPP: 650.0000 mg | Freq: Four times a day (QID) | RECTAL | Status: DC | PRN

## 2019-03-25 MED ORDER — FOLIC ACID 1 MG PO TABS
1.0000 mg | ORAL_TABLET | Freq: Every day | ORAL | Status: DC
  Administered 2019-03-25 – 2019-03-31 (×7): 1 mg via ORAL
  Filled 2019-03-25 (×7): qty 1

## 2019-03-25 MED ORDER — LORAZEPAM 2 MG/ML IJ SOLN
INTRAMUSCULAR | Status: AC
  Administered 2019-03-25: 04:00:00 2 mg via INTRAVENOUS
  Filled 2019-03-25: qty 1

## 2019-03-25 MED ORDER — TRAZODONE HCL 50 MG PO TABS
100.0000 mg | ORAL_TABLET | Freq: Every evening | ORAL | Status: DC | PRN
  Administered 2019-03-25 – 2019-03-30 (×6): 100 mg via ORAL
  Filled 2019-03-25 (×6): qty 2

## 2019-03-25 MED ORDER — ADULT MULTIVITAMIN W/MINERALS CH
1.0000 | ORAL_TABLET | Freq: Every day | ORAL | Status: DC
  Administered 2019-03-25 – 2019-03-31 (×7): 1 via ORAL
  Filled 2019-03-25 (×7): qty 1

## 2019-03-25 MED ORDER — LORAZEPAM 2 MG/ML IJ SOLN
1.0000 mg | Freq: Once | INTRAMUSCULAR | Status: AC
  Administered 2019-03-25: 1 mg via INTRAVENOUS
  Filled 2019-03-25: qty 1

## 2019-03-25 MED ORDER — NICOTINE 14 MG/24HR TD PT24
14.0000 mg | MEDICATED_PATCH | Freq: Every day | TRANSDERMAL | Status: DC
  Administered 2019-03-25 – 2019-03-31 (×7): 14 mg via TRANSDERMAL
  Filled 2019-03-25 (×7): qty 1

## 2019-03-25 MED ORDER — SERTRALINE HCL 50 MG PO TABS
50.0000 mg | ORAL_TABLET | Freq: Every day | ORAL | Status: DC
  Administered 2019-03-25 – 2019-03-31 (×7): 50 mg via ORAL
  Filled 2019-03-25 (×7): qty 1

## 2019-03-25 MED ORDER — PANTOPRAZOLE SODIUM 40 MG PO TBEC
40.0000 mg | DELAYED_RELEASE_TABLET | Freq: Two times a day (BID) | ORAL | Status: DC
  Administered 2019-03-25 – 2019-03-31 (×13): 40 mg via ORAL
  Filled 2019-03-25 (×13): qty 1

## 2019-03-25 MED ORDER — LORAZEPAM 2 MG/ML IJ SOLN: 0.0000 mg | Freq: Two times a day (BID) | INTRAMUSCULAR | Status: AC

## 2019-03-25 MED ORDER — LORAZEPAM 2 MG/ML IJ SOLN
0.0000 mg | Freq: Four times a day (QID) | INTRAMUSCULAR | Status: AC
  Administered 2019-03-25 (×2): 2 mg via INTRAVENOUS
  Filled 2019-03-25 (×2): qty 1

## 2019-03-25 MED ORDER — SODIUM CHLORIDE 0.9 % IV BOLUS
1000.0000 mL | Freq: Once | INTRAVENOUS | Status: AC
  Administered 2019-03-25: 1000 mL via INTRAVENOUS

## 2019-03-25 MED ORDER — ALBUTEROL SULFATE (2.5 MG/3ML) 0.083% IN NEBU: 2.5000 mg | INHALATION_SOLUTION | Freq: Four times a day (QID) | RESPIRATORY_TRACT | Status: DC | PRN

## 2019-03-25 MED ORDER — ACETAMINOPHEN 325 MG PO TABS
650.0000 mg | ORAL_TABLET | Freq: Four times a day (QID) | ORAL | Status: DC | PRN
  Administered 2019-03-25 – 2019-03-29 (×10): 650 mg via ORAL
  Filled 2019-03-25 (×10): qty 2

## 2019-03-25 MED ORDER — IOHEXOL 300 MG/ML  SOLN
100.0000 mL | Freq: Once | INTRAMUSCULAR | Status: AC | PRN
  Administered 2019-03-25: 100 mL via INTRAVENOUS

## 2019-03-25 NOTE — ED Notes (Signed)
Patient transported to CT 

## 2019-03-25 NOTE — Consult Note (Signed)
[place] Nurse wound consult note Reason for Consult:Thermal injury to face and feet/lower legs from sunburn.  Mechanical injury to dorsal feet from footwear and ruptured blisters with healing partial thickness wounds.   Face with peeling, erythematous epithelium Wound type:thermal injury Pressure Injury POA: NA Measurement:scattered peeling epithelium Left foot:  1.5 cm x 2.2 cm x 0.2 cm scabbed lesions Right foot:  2 cm x 2.5 cm  0.2 cm scabbed lesion Wound QP:[id number] to feet, otherwise pink and moist Drainage (amount, consistency, odor) minimal serosanguinous  No odor Periwound:erythema and tender to touch Dressing procedure/placement/frequency:Cleanse face and feet with soap and water and pat dry.  Apply mupirocin ointment to wounds twice daily.  Will not follow at this time.  Please re-consult if needed.  Domenic Moras MSN, RN, FNP-BC CWON Wound, Ostomy, Continence Nurse Pager [phone]

## 2019-03-25 NOTE — ED Provider Notes (Addendum)
[facility] EMERGENCY DEPARTMENT Provider Note   CSN: [id number] Arrival date & time: 03/24/19  1948     History   Chief Complaint Chief Complaint  Patient presents with  . Sunburn    HPI Samuel Reyes is a 63 y.o. male.     Patient is a 63 year old male with past medical history of chronic alcoholism, homelessness, COPD, hypertension.  He presents today for evaluation of abdominal pain, nausea, and vomiting.  This is been ongoing for the past 2 days.  He describes a constant pain to the epigastrium.  He denies any fevers or chills.  Patient was recently admitted with similar complaints.  He is found to be hyponatremic.  The history is provided by the patient.  Abdominal Pain Pain location:  Epigastric Pain quality: gnawing   Pain radiates to:  Does not radiate Pain severity:  Moderate Onset quality:  Sudden Duration:  2 days Timing:  Constant Progression:  Worsening Chronicity:  Recurrent Context: alcohol use   Relieved by:  Nothing Worsened by:  Movement and palpation Ineffective treatments:  None tried   Past Medical History:  Diagnosis Date  . Alcoholism ([place])   . Barrett's esophagus   . CAD (coronary artery disease) 2002   MI, no intervention required  . COPD (chronic obstructive pulmonary disease) (HCC)    not on home O2  . Depression   . DTs (delirium tremens) ([place])   . GERD (gastroesophageal reflux disease)   . Headache   . Homeless   . Hypertension   . Stroke [facility]) 2006    Patient Active Problem List   Diagnosis Date Noted  . QT prolongation 09/09/2018  . Overdose 09/06/2018  . Substance induced mood disorder ([place]) 07/25/2018  . MDD (major depressive disorder), recurrent episode, severe ([place]) 07/18/2018  . MDD (major depressive disorder), recurrent severe, without psychosis ([place]) 08/26/2017  . Alcohol withdrawal ([place]) 08/08/2017  . COPD with acute bronchitis ([place]) 07/29/2017  . Alcohol use   . Homelessness 06/24/2017  .  Lung nodule 09/03/2016  . Tobacco abuse 09/03/2016  . Insomnia 09/03/2016  . HCAP (healthcare-associated pneumonia) 08/22/2016  . Bronchiolitis 08/21/2016  . Malnutrition of moderate degree 08/04/2016  . Atrial tachycardia ([place]) 08/02/2016  . Impaired glucose tolerance 08/02/2016  . Alcohol-induced mood disorder ([place]) 12/12/2015  . Syncope 11/15/2015  . Chest pain 11/15/2015  . Nausea vomiting and diarrhea 11/15/2015  . Abdominal pain 11/15/2015  . Major depressive disorder, recurrent episode, moderate with anxious distress ([place]) 10/30/2015  . Alcohol use disorder, severe, dependence ([place]) 10/29/2015  . COPD exacerbation ([place]) 09/20/2015  . Acute respiratory failure with hypoxia ([place]) 09/18/2015  . Suicidal ideation 09/17/2015  . Alcohol intoxication ([place]) 09/17/2015  . Depression 09/17/2015  . Benign essential HTN 09/17/2015  . Hypokalemia 09/17/2015  . Hyponatremia 09/17/2015  . Coffee ground emesis 09/17/2015  . COPD (chronic obstructive pulmonary disease) ([place]) 02/17/2013  . GERD (gastroesophageal reflux disease) 02/17/2013    Past Surgical History:  Procedure Laterality Date  . BACK SURGERY     3 cervical spine surgeries C4-C5 fused  . COLONOSCOPY N/A 01/04/2014   Procedure: COLONOSCOPY;  Surgeon: Danie Binder, MD;  Location[facility];  Service: Endoscopy;  Laterality: N/A;  1:45  . ESOPHAGOGASTRODUODENOSCOPY N/A 01/04/2014   Procedure: ESOPHAGOGASTRODUODENOSCOPY (EGD);  Surgeon: Danie Binder, MD;  Location: [facility];  Service: Endoscopy;  Laterality: N/A;  . FINGER SURGERY Left    2nd, 3rd, & 4th fingers were cut off by  table saw and reattached  . GASTRECTOMY    . HERNIA REPAIR    . INCISIONAL HERNIA REPAIR N/A 01/20/2014   Procedure: LAPAROSCOPIC RECURRENT  INCISIONAL HERNIA with mesh;  Surgeon: Edward Jolly, MD;  Location: [facility] ORS;  Service: General;  Laterality: N/A;  . rt knee arthroscopic surgery    . SHOULDER SURGERY Bilateral    3 surgeries on  on left, 2 surgeries on right         Home Medications    Prior to Admission medications   Medication Sig Start Date End Date Taking? Authorizing Provider  albuterol (VENTOLIN HFA) 108 (90 Base) MCG/ACT inhaler Inhale 2 puffs into the lungs every 4 (four) hours as needed for wheezing or shortness of breath. 01/15/19   Ladell Pier, MD  folic acid (FOLVITE) 1 MG tablet Take 1 tablet (1 mg total) by mouth daily. 03/19/19   Cristal Ford, DO  Multiple Vitamin (MULTIVITAMIN WITH MINERALS) TABS tablet Take 1 tablet by mouth daily. 03/19/19   Mikhail, Velta Addison, DO  nicotine (NICODERM CQ - DOSED IN MG/24 HOURS) 14 mg/24hr patch Place 1 patch (14 mg total) onto the skin daily. 03/19/19   Mikhail, Velta Addison, DO  pantoprazole (PROTONIX) 40 MG tablet Take 1 tablet (40 mg total) by mouth 2 (two) times daily. For reflux 03/19/19   Cristal Ford, DO  sertraline (ZOLOFT) 50 MG tablet Take 1 tablet (50 mg total) by mouth daily. For mood 01/15/19   Ladell Pier, MD  thiamine 100 MG tablet Take 1 tablet (100 mg total) by mouth daily. 03/19/19   Cristal Ford, DO  traZODone (DESYREL) 50 MG tablet Take 2 tablets (100 mg total) by mouth at bedtime and may repeat dose one time if needed. For sleep 01/15/19   Ladell Pier, MD    Family History Family History  Problem Relation Age of Onset  . Cancer Father        bone  . Cancer Brother        lungs  . Stroke Maternal Grandmother   . Asthma Son        died at age 38 in his sleep   . Spina bifida Son        died at age 31   . Dementia Mother   . Colon cancer Neg Hx     Social History Social History   Tobacco Use  . Smoking status: Current Every Day Smoker    Packs/day: 1.00    Years: 52.00    Pack years: 52.00    Types: Cigarettes    Start date: 07/30/1966  . Smokeless tobacco: Never Used  . Tobacco comment: uses nicotine patch  Substance Use Topics  . Alcohol use: Yes    Alcohol/week: 15.0 standard drinks    Types: 15 Cans of  beer per week    Comment: not drink for 8 weeks  . Drug use: No    Comment: denied using any drugs     Allergies   Bee venom, Penicillins, and Vancomycin   Review of Systems Review of Systems  Gastrointestinal: Positive for abdominal pain.  All other systems reviewed and are negative.    Physical Exam Updated Vital Signs BP (!) 135/118 (BP Location: Right Arm)   Pulse 80   Temp 99.6 F (37.6 C) (Oral)   Resp 17   SpO2 100%   Physical Exam Vitals signs and nursing note reviewed.  Constitutional:      General: He is not in acute  distress.    Appearance: He is well-developed. He is not diaphoretic.  HENT:     Head: Normocephalic and atraumatic.  Neck:     Musculoskeletal: Normal range of motion and neck supple.  Cardiovascular:     Rate and Rhythm: Normal rate and regular rhythm.     Heart sounds: No murmur. No friction rub.  Pulmonary:     Effort: Pulmonary effort is normal. No respiratory distress.     Breath sounds: Normal breath sounds. No wheezing or rales.  Abdominal:     General: Bowel sounds are normal. There is no distension.     Palpations: Abdomen is soft.     Tenderness: There is abdominal tenderness. There is no guarding or rebound.     Comments: There is tenderness to palpation in the epigastric region.  Musculoskeletal: Normal range of motion.  Skin:    General: Skin is warm and dry.  Neurological:     Mental Status: He is alert and oriented to person, place, and time.     Coordination: Coordination normal.      ED Treatments / Results  Labs (all labs ordered are listed, but only abnormal results are displayed) Labs Reviewed  COMPREHENSIVE METABOLIC PANEL  LIPASE, BLOOD  CBC WITH DIFFERENTIAL/PLATELET  TROPONIN I (HIGH SENSITIVITY)    EKG None  Radiology No results found.  Procedures Procedures (including critical care time)  Medications Ordered in ED Medications  sodium chloride 0.9 % bolus 1,000 mL (has no administration in  time range)  morphine 4 MG/ML injection 4 mg (has no administration in time range)     Initial Impression / Assessment and Plan / ED Course  I have reviewed the triage vital signs and the nursing notes.  Pertinent labs & imaging results that were available during my care of the patient were reviewed by me and considered in my medical decision making (see chart for details).  Patient presenting with complaints of abdominal pain and vomiting.  Patient with history of alcoholism and homelessness with recent admission for DTs and hyponatremia.  Work-up today reveals unremarkable laboratory studies and CT scan.  While patient was in the ER, he began having shaking and confusion and became tachycardic and diaphoretic.  Patient's CIWA score at the time was 15.  He was given 2 separate doses of Ativan and is now improving.  I feel as though patient will require admission for acute alcohol withdrawal.  This was discussed with Dr. Myna Hidalgo from the hospitalist service who agrees to admit.  Final Clinical Impressions(s) / ED Diagnoses   Final diagnoses:  None    ED Discharge Orders    None       Veryl Speak, MD 03/25/19 [id number]    Veryl Speak, MD 03/25/19 [phone]

## 2019-03-25 NOTE — ED Notes (Signed)
ED TO INPATIENT HANDOFF REPORT  ED Nurse Name and Phone #: Ninfa Meeker  S Name/Age/Gender Samuel Reyes 63 y.o. male Room/Bed: 016C/016C  Code Status   Code Status: Prior  Home/SNF/Other Homeless Patient oriented to: self, place, time and situation Is this baseline? Yes   Triage Complete: Triage complete  Chief Complaint sunburn 2nd degree  Triage Note Patient arrived with EMS from street ( homeless) with 2nd degree skin sunburn with redness/blisters , dry scabs at face and lower legs . Respirations unlabored /ambulatory .   Allergies Allergies  Allergen Reactions  . Bee Venom Anaphylaxis  . Penicillins Rash    Has patient had a PCN reaction causing immediate rash, facial/tongue/throat swelling, SOB or lightheadedness with hypotension: {Yes Has patient had a PCN reaction causing severe rash involving mucus membranes or skin necrosis: YES Has patient had a PCN reaction that required hospitalization Yes Has patient had a PCN reaction occurring within the last 10 years: YES If all of the above answers are "NO", then may proceed with Cephalosporin use.   . Vancomycin Tinitus    Level of Care/Admitting Diagnosis ED Disposition    ED Disposition Condition [facility] Area: [place] [100100]  Level of Care: Progressive [102]  I expect the patient will be discharged within 24 hours: Yes  LOW acuity---Tx typically complete <24 hrs---ACUTE conditions typically can be evaluated <24 hours---LABS likely to return to acceptable levels <24 hours---IS near functional baseline---EXPECTED to return to current living arrangement---NOT newly hypoxic: Does not meet criteria for 5C-Observation unit  Covid Evaluation: Asymptomatic Screening Protocol (No Symptoms)  Diagnosis: Alcohol withdrawal ([place]) [291.81.ICD-9-CM]  Admitting Physician: KHAIR, VIESCA [id number]  Attending Physician: Vianne Bulls [id number]  PT Class (Do Not Modify): Observation [104]   PT Acc Code (Do Not Modify): Observation [10022]       B Medical/Surgery History Past Medical History:  Diagnosis Date  . Alcoholism ([place])   . Barrett's esophagus   . CAD (coronary artery disease) 2002   MI, no intervention required  . COPD (chronic obstructive pulmonary disease) (HCC)    not on home O2  . Depression   . DTs (delirium tremens) ([place])   . GERD (gastroesophageal reflux disease)   . Headache   . Homeless   . Hypertension   . Stroke [facility]) 2006   Past Surgical History:  Procedure Laterality Date  . BACK SURGERY     3 cervical spine surgeries C4-C5 fused  . COLONOSCOPY N/A 01/04/2014   Procedure: COLONOSCOPY;  Surgeon: Danie Binder, MD;  [facility];  Service: Endoscopy;  Laterality: N/A;  1:45  . ESOPHAGOGASTRODUODENOSCOPY N/A 01/04/2014   Procedure: ESOPHAGOGASTRODUODENOSCOPY (EGD);  Surgeon: Danie Binder, MD;  Location[facility];  Service: Endoscopy;  Laterality: N/A;  . FINGER SURGERY Left    2nd, 3rd, & 4th fingers were cut off by table saw and reattached  . GASTRECTOMY    . HERNIA REPAIR    . INCISIONAL HERNIA REPAIR N/A 01/20/2014   Procedure: LAPAROSCOPIC RECURRENT  INCISIONAL HERNIA with mesh;  Surgeon: Edward Jolly, MD;  Location: [facility] ORS;  Service: General;  Laterality: N/A;  . rt knee arthroscopic surgery    . SHOULDER SURGERY Bilateral    3 surgeries on on left, 2 surgeries on right      A IV Location/Drains/Wounds Patient Lines/Drains/Airways Status   Active Line/Drains/Airways    Name:   Placement date:   Placement time:  Site:   Days:   Peripheral IV 03/25/19 Right Hand   03/25/19    0149    Hand   less than 1   Wound / Incision (Open or Dehisced) 03/13/19 Laceration Head Right   03/13/19    1900    Head   12          Intake/Output Last 24 hours No intake or output data in the 24 hours ending 03/25/19 0757  Labs/Imaging Results for orders placed or performed during the hospital encounter of 03/24/19 (from the  past 48 hour(s))  Comprehensive metabolic panel     Status: Abnormal   Collection Time: 03/25/19  3:14 AM  Result Value Ref Range   Sodium 134 (L) 135 - 145 mmol/L   Potassium 4.4 3.5 - 5.1 mmol/L   Chloride 102 98 - 111 mmol/L   CO2 20 (L) 22 - 32 mmol/L   Glucose, Bld 90 70 - 99 mg/dL   BUN 9 8 - 23 mg/dL   Creatinine, Ser 0.82 0.61 - 1.24 mg/dL   Calcium 8.3 (L) 8.9 - 10.3 mg/dL   Total Protein 6.4 (L) 6.5 - 8.1 g/dL   Albumin 3.3 (L) 3.5 - 5.0 g/dL   AST 28 15 - 41 U/L   ALT 31 0 - 44 U/L   Alkaline Phosphatase 90 38 - 126 U/L   Total Bilirubin 0.8 0.3 - 1.2 mg/dL   GFR calc non Af Amer >60 >60 mL/min   GFR calc Af Amer >60 >60 mL/min   Anion gap 12 5 - 15    Comment: Performed at [facility], [address] [place], [place] [address]  Lipase, blood     Status: None   Collection Time: 03/25/19  3:14 AM  Result Value Ref Range   Lipase 21 11 - 51 U/L    Comment: Performed at [facility] [address], [place], [place] [address]  CBC with Differential     Status: Abnormal   Collection Time: 03/25/19  3:14 AM  Result Value Ref Range   WBC 12.4 (H) 4.0 - 10.5 K/uL   RBC 4.33 4.22 - 5.81 MIL/uL   Hemoglobin 12.1 (L) 13.0 - 17.0 g/dL   HCT 37.5 (L) 39.0 - 52.0 %   MCV 86.6 80.0 - 100.0 fL   MCH 27.9 26.0 - 34.0 pg   MCHC 32.3 30.0 - 36.0 g/dL   RDW 19.1 (H) 11.5 - 15.5 %   Platelets 532 (H) 150 - 400 K/uL   nRBC 0.0 0.0 - 0.2 %   Neutrophils Relative % 78 %   Neutro Abs 9.5 (H) 1.7 - 7.7 K/uL   Lymphocytes Relative 12 %   Lymphs Abs 1.5 0.7 - 4.0 K/uL   Monocytes Relative 9 %   Monocytes Absolute 1.1 (H) 0.1 - 1.0 K/uL   Eosinophils Relative 0 %   Eosinophils Absolute 0.0 0.0 - 0.5 K/uL   Basophils Relative 0 %   Basophils Absolute 0.1 0.0 - 0.1 K/uL   Immature Granulocytes 1 %   Abs Immature Granulocytes 0.15 (H) 0.00 - 0.07 K/uL    Comment: Performed at [facility] [address] [place], [place] [address]  Troponin I (High Sensitivity)      Status: None   Collection Time: 03/25/19  3:14 AM  Result Value Ref Range   Troponin I (High Sensitivity) 8 <18 ng/L    Comment: (NOTE) Elevated high sensitivity troponin I (hsTnI) values  and significant  changes across serial measurements may suggest ACS but many other  chronic and acute conditions are known to elevate hsTnI results.  Refer to the "Links" section for chest pain algorithms and additional  guidance. Performed at [facility], [place] [address], [place], [place] [address]   Troponin I (High Sensitivity)     Status: None   Collection Time: 03/25/19  6:16 AM  Result Value Ref Range   Troponin I (High Sensitivity) 11 <18 ng/L    Comment: (NOTE) Elevated high sensitivity troponin I (hsTnI) values and significant  changes across serial measurements may suggest ACS but many other  chronic and acute conditions are known to elevate hsTnI results.  Refer to the "Links" section for chest pain algorithms and additional  guidance. Performed at [facility], [place] [address], [place], [place] [address]    Ct Abdomen Pelvis W Contrast  Result Date: 03/25/2019 CLINICAL DATA:  Acute generalized abdominal pain EXAM: CT ABDOMEN AND PELVIS WITH CONTRAST TECHNIQUE: Multidetector CT imaging of the abdomen and pelvis was performed using the standard protocol following bolus administration of intravenous contrast. CONTRAST:  156mL OMNIPAQUE IOHEXOL 300 MG/ML  SOLN COMPARISON:  10/25/2018 FINDINGS: Lower chest:  Minimal probable atelectasis at the right base. Hepatobiliary: Hepatic steatosis. No focal liver lesion.No evidence of biliary obstruction or stone. Pancreas: Unremarkable. Spleen: Unremarkable. Adrenals/Urinary Tract: Negative adrenals. No hydronephrosis or stone. Unremarkable bladder. Stomach/Bowel: No obstruction. No appendicitis. Clips around the GE junction. Vascular/Lymphatic: No acute vascular abnormality. Atherosclerosis with moderate proximal SMA narrowing. No mass or  adenopathy. Reproductive:Negative Other: No ascites or pneumoperitoneum. Right inguinal hernia repair using mesh. Musculoskeletal: No acute abnormalities. Remote bilateral posterior rib fractures. Advanced L1-2 and L5-S1 disc degeneration. IMPRESSION: 1. No acute finding. 2. Hepatic steatosis. 3. Atherosclerosis. Electronically Signed   By: Monte Fantasia M.D.   On: 03/25/2019 05:42    Pending Labs Unresulted Labs (From admission, onward)   None      Vitals/Pain Today's Vitals   03/25/19 0700 03/25/19 0715 03/25/19 0730 03/25/19 0745  BP: 120/73 126/63 114/61 106/60  Pulse: (!) 102 95 95 92  Resp:  16    Temp:      TempSrc:      SpO2: 99% 98% 98% 96%  PainSc:        Isolation Precautions No active isolations  Medications Medications  LORazepam (ATIVAN) injection 0-4 mg (2 mg Intravenous Given 03/25/19 0301)    Or  LORazepam (ATIVAN) tablet 0-4 mg ( Oral See Alternative 03/25/19 0301)  LORazepam (ATIVAN) injection 0-4 mg (has no administration in time range)    Or  LORazepam (ATIVAN) tablet 0-4 mg (has no administration in time range)  thiamine (VITAMIN B-1) tablet 100 mg (has no administration in time range)    Or  thiamine (B-1) injection 100 mg (has no administration in time range)  sodium chloride 0.9 % bolus 1,000 mL (1,000 mLs Intravenous New Bag/Given 03/25/19 0248)  morphine 4 MG/ML injection 4 mg (4 mg Intravenous Given 03/25/19 0250)  LORazepam (ATIVAN) injection 2 mg (2 mg Intravenous Given 03/25/19 0400)  iohexol (OMNIPAQUE) 300 MG/ML solution 100 mL (100 mLs Intravenous Contrast Given 03/25/19 0511)    Mobility walks Low fall risk    R Recommendations: See Admitting Provider Note  Report given to:   Additional Notes:

## 2019-03-25 NOTE — H&P (Signed)
History and Physical    [name] [id number] DOB: Jul 19, 1956 DOA: 03/24/2019  Referring MD/NP/PA: Mitzi Hansen, MD PCP: [name], NP  Patient coming from: home   Chief Complaint: Abdominal pain with nausea and vomiting  I have personally briefly reviewed patient's old medical records in [facility]   HPI: Samuel Reyes is a 63 y.o. male with medical history significant of alcohol abuse with history of DT, HTN, COPD, CAD, CVA, homelessness, and tobacco abuse; who presents with complaints of constant epigastric abdominal pain with nausea and vomiting the last 2 days.  Emesis was noted to be nonbloody in appearance.  Associated symptoms include dizziness, falls, loss of consciousness for unknown period of time, trauma to his head, and tremors.  He reports being outside in the sun and developing blisters on his feet, knees, and face.  Patient reports that his last drink was day before yesterday, but notes drinking 3-4 twelve pack beer per day on average.  He was just last discharged from the hospital 6 days ago, for severe hyponatremia requiring admission initially to the ICU.  Symptoms improved with IV fluids.  ED Course: On admission to the emergency department patient was noted to be afebrile, 88 116, respiration 15-25, blood pressures maintained, and O2 saturations maintained on room air. Labs revealed WBC 12.4, hemoglobin 12.1, platelets 532, and sodium 134.  CT scan of the abdomen and pelvis with contrast showed no acute abnormalities with hepatic steatosis.  He was noted to be in significant alcohol withdrawals with CIWA score of 15.  Patient was started on CWIA protocols.  TRH called to admit.   Review of Systems  Constitutional: Positive for malaise/fatigue.  HENT: Negative for ear discharge and sore throat.   Eyes: Negative for pain and discharge.  Respiratory: Positive for cough. Negative for shortness of breath.   Cardiovascular: Negative for chest pain, leg  swelling and PND.  Gastrointestinal: Positive for abdominal pain, nausea and vomiting. Negative for diarrhea.  Genitourinary: Negative for dysuria and hematuria.  Musculoskeletal: Positive for falls.  Skin: Positive for rash.  Neurological: Positive for dizziness, tremors, loss of consciousness and weakness.  Psychiatric/Behavioral: Positive for substance abuse. The patient is nervous/anxious.     Past Medical History:  Diagnosis Date  . Alcoholism ([place])   . Barrett's esophagus   . CAD (coronary artery disease) 2002   MI, no intervention required  . COPD (chronic obstructive pulmonary disease) (HCC)    not on home O2  . Depression   . DTs (delirium tremens) ([place])   . GERD (gastroesophageal reflux disease)   . Headache   . Homeless   . Hypertension   . Stroke [facility]) 2006    Past Surgical History:  Procedure Laterality Date  . BACK SURGERY     3 cervical spine surgeries C4-C5 fused  . COLONOSCOPY N/A 01/04/2014   Procedure: COLONOSCOPY;  Surgeon: Danie Binder, MD;  Location: [facility] END[facility];  Service: Endoscopy;  Laterality: N/A;  1:45  . ESOPHAGOGASTRODUODENOSCOPY N/A 01/04/2014   Procedure: ESOPHAGOGASTRODUODENOSCOPY (EGD);  Surgeon: Danie Binder, MD;  Location: [facility];  Service: Endoscopy;  Laterality: N/A;  . FINGER SURGERY Left    2nd, 3rd, & 4th fingers were cut off by table saw and reattached  . GASTRECTOMY    . HERNIA REPAIR    . INCISIONAL HERNIA REPAIR N/A 01/20/2014   Procedure: LAPAROSCOPIC RECURRENT  INCISIONAL HERNIA with mesh;  Surgeon: Edward Jolly, MD;  Location: [facility] ORS;  Service:  General;  Laterality: N/A;  . rt knee arthroscopic surgery    . SHOULDER SURGERY Bilateral    3 surgeries on on left, 2 surgeries on right      reports that he has been smoking cigarettes. He started smoking about 52 years ago. He has a 52.00 pack-year smoking history. He has never used smokeless tobacco. He reports current alcohol use of about 15.0 standard drinks of  alcohol per week. He reports that he does not use drugs.  Allergies  Allergen Reactions  . Bee Venom Anaphylaxis  . Penicillins Rash    Has patient had a PCN reaction causing immediate rash, facial/tongue/throat swelling, SOB or lightheadedness with hypotension: {Yes Has patient had a PCN reaction causing severe rash involving mucus membranes or skin necrosis: YES Has patient had a PCN reaction that required hospitalization Yes Has patient had a PCN reaction occurring within the last 10 years: YES If all of the above answers are "NO", then may proceed with Cephalosporin use.   . Vancomycin Tinitus    Family History  Problem Relation Age of Onset  . Cancer Father        bone  . Cancer Brother        lungs  . Stroke Maternal Grandmother   . Asthma Son        died at age 63 in his sleep   . Spina bifida Son        died at age 44   . Dementia Mother   . Colon cancer Neg Hx     Prior to Admission medications   Medication Sig Start Date End Date Taking? Authorizing Provider  albuterol (VENTOLIN HFA) 108 (90 Base) MCG/ACT inhaler Inhale 2 puffs into the lungs every 4 (four) hours as needed for wheezing or shortness of breath. 01/15/19  Yes Ladell Pier, MD  Aspirin-Acetaminophen-Caffeine (GOODY HEADACHE PO) Take 1 packet by mouth daily as needed (headache).   Yes [provider]  folic acid (FOLVITE) 1 MG tablet Take 1 tablet (1 mg total) by mouth daily. 03/19/19  Yes Mikhail, Jasper, DO  Multiple Vitamin (MULTIVITAMIN WITH MINERALS) TABS tablet Take 1 tablet by mouth daily. 03/19/19  Yes Mikhail, Maryann, DO  pantoprazole (PROTONIX) 40 MG tablet Take 1 tablet (40 mg total) by mouth 2 (two) times daily. For reflux 03/19/19  Yes Mikhail, Morse, DO  sertraline (ZOLOFT) 50 MG tablet Take 1 tablet (50 mg total) by mouth daily. For mood 01/15/19  Yes Ladell Pier, MD  thiamine 100 MG tablet Take 1 tablet (100 mg total) by mouth daily. 03/19/19  Yes Mikhail, Velta Addison, DO   traZODone (DESYREL) 50 MG tablet Take 2 tablets (100 mg total) by mouth at bedtime and may repeat dose one time if needed. For sleep 01/15/19  Yes Ladell Pier, MD  nicotine (NICODERM CQ - DOSED IN MG/24 HOURS) 14 mg/24hr patch Place 1 patch (14 mg total) onto the skin daily. Patient not taking: Reported on 03/25/2019 03/19/19   Cristal Ford, DO    Physical Exam:  Constitutional: Elderly male who appears to be in some discomfort Vitals:   03/25/19 0700 03/25/19 0715 03/25/19 0730 03/25/19 0745  BP: 120/73 126/63 114/61 106/60  Pulse: (!) 102 95 95 92  Resp:  16    Temp:      TempSrc:      SpO2: 99% 98% 98% 96%   Eyes: PERRL, lids and conjunctivae normal ENMT: Mucous membranes are dry. Posterior pharynx clear of any exudate  or lesions.  Neck: normal, supple, no masses, no thyromegaly Respiratory: clear to auscultation bilaterally, no wheezing, no crackles. Normal respiratory effort. No accessory muscle use.  Cardiovascular: Regular rate and rhythm, no murmurs / rubs / gallops. No extremity edema. 2+ pedal pulses. No carotid bruits.  Abdomen: no tenderness, no masses palpated. No hepatosplenomegaly. Bowel sounds positive.  Musculoskeletal: no clubbing / cyanosis. No joint deformity upper and lower extremities. Good ROM, no contractures. Normal muscle tone.  Skin: Erythematous appearance of the skin of the face with peeling present.  Blisters present of the bilateral lower extremities. Neurologic: CN 2-12 grossly intact. Strength 5/5 in all 4.  Tremulous. Psychiatric: Poor judgment and insight. Alert and oriented x 3.  Anxious mood.     Labs on Admission: I have personally reviewed following labs and imaging studies  CBC: Recent Labs  Lab 03/25/19 0314  WBC 12.4*  NEUTROABS 9.5*  HGB 12.1*  HCT 37.5*  MCV 86.6  PLT 123XX123*   Basic Metabolic Panel: Recent Labs  Lab 03/19/19 0325 03/25/19 0314  NA 134* 134*  K 3.9 4.4  CL 101 102  CO2 24 20*  GLUCOSE 99 90  BUN 10  9  CREATININE 0.75 0.82  CALCIUM 8.1* 8.3*  MG 1.9  --    GFR: Estimated Creatinine Clearance: 77.2 mL/min (by C-G formula based on SCr of 0.82 mg/dL). Liver Function Tests: Recent Labs  Lab 03/25/19 0314  AST 28  ALT 31  ALKPHOS 90  BILITOT 0.8  PROT 6.4*  ALBUMIN 3.3*   Recent Labs  Lab 03/25/19 0314  LIPASE 21   No results for input(s): AMMONIA in the last 168 hours. Coagulation Profile: No results for input(s): INR, PROTIME in the last 168 hours. Cardiac Enzymes: No results for input(s): CKTOTAL, CKMB, CKMBINDEX, TROPONINI in the last 168 hours. BNP (last 3 results) No results for input(s): PROBNP in the last 8760 hours. HbA1C: No results for input(s): HGBA1C in the last 72 hours. CBG: No results for input(s): GLUCAP in the last 168 hours. Lipid Profile: No results for input(s): CHOL, HDL, LDLCALC, TRIG, CHOLHDL, LDLDIRECT in the last 72 hours. Thyroid Function Tests: No results for input(s): TSH, T4TOTAL, FREET4, T3FREE, THYROIDAB in the last 72 hours. Anemia Panel: No results for input(s): VITAMINB12, FOLATE, FERRITIN, TIBC, IRON, RETICCTPCT in the last 72 hours. Urine analysis:    Component Value Date/Time   COLORURINE YELLOW 12/31/2018 1055   APPEARANCEUR CLEAR 12/31/2018 1055   LABSPEC 1.020 12/31/2018 1055   PHURINE 6.0 12/31/2018 1055   GLUCOSEU NEGATIVE 12/31/2018 1055   HGBUR NEGATIVE 12/31/2018 [place] 12/31/2018 1055   KETONESUR NEGATIVE 12/31/2018 1055   PROTEINUR NEGATIVE 12/31/2018 1055   UROBILINOGEN 0.2 05/31/2015 2156   NITRITE NEGATIVE 12/31/2018 1055   LEUKOCYTESUR NEGATIVE 12/31/2018 1055   Sepsis Labs: No results found for this or any previous visit (from the past 240 hour(s)).   Radiological Exams on Admission: Ct Abdomen Pelvis W Contrast  Result Date: 03/25/2019 CLINICAL DATA:  Acute generalized abdominal pain EXAM: CT ABDOMEN AND PELVIS WITH CONTRAST TECHNIQUE: Multidetector CT imaging of the abdomen and  pelvis was performed using the standard protocol following bolus administration of intravenous contrast. CONTRAST:  116mL OMNIPAQUE IOHEXOL 300 MG/ML  SOLN COMPARISON:  10/25/2018 FINDINGS: Lower chest:  Minimal probable atelectasis at the right base. Hepatobiliary: Hepatic steatosis. No focal liver lesion.No evidence of biliary obstruction or stone. Pancreas: Unremarkable. Spleen: Unremarkable. Adrenals/Urinary Tract: Negative adrenals. No hydronephrosis or stone. Unremarkable bladder.  Stomach/Bowel: No obstruction. No appendicitis. Clips around the GE junction. Vascular/Lymphatic: No acute vascular abnormality. Atherosclerosis with moderate proximal SMA narrowing. No mass or adenopathy. Reproductive:Negative Other: No ascites or pneumoperitoneum. Right inguinal hernia repair using mesh. Musculoskeletal: No acute abnormalities. Remote bilateral posterior rib fractures. Advanced L1-2 and L5-S1 disc degeneration. IMPRESSION: 1. No acute finding. 2. Hepatic steatosis. 3. Atherosclerosis. Electronically Signed   By: Monte Fantasia M.D.   On: 03/25/2019 05:42    EKG: Independently reviewed.  Sinus tachycardia 105 bpm with QTC 463  Assessment/Plan Alcohol withdrawals. Acute.  Presents significantly tremulous and tachycardic.  Suspect some aspect of alcohol withdrawals given the significant amount of alcohol patient normally drinks on a daily basis.  Last drink reported the day before yesterday. -Admit to a progressive bed -CWIA protocols with scheduled Ativan -Consider need of additional doses of Ativan IV for withdrawal symptoms -Continue counseling on the need of cessation of alcohol use  Leukocytosis: Acute.  WBC elevated at 12.4.   -Recheck CBC in a.m.  Epigastric abdominal pain/nausea and vomiting: Acute.  Patient presents complaining of abdominal pain, nausea, and vomiting.  Question possibility of gastritis secondary to alcohol. -Antiemetics as needed -Advance diet as tolerated  -Check  urinalysis -Normal saline IV fluids at 100 mL/h x 1 L -Continue PPI  Sunburn: Acute.  Patient with significant sunburns on the face and legs with blisters present. -Wound care nurse consulted(recommending applying mupirocin ointment to wounds twice daily. Cleansing face and feet with soapy water and patting dry.   Dizziness/falls: Patient reports feeling lightheaded and having multiple falls with trauma to his head.  Likely secondary to alcohol abuse and/or dehydration. -Will need to consult physical therapy once medically stable  Normocytic anemia: Stable.  Hemoglobin 12.1 g/dL on admission. -Continue to monitor  Hyponatremia: Chronic.  Sodium 134 on admission which appears stable since last discharge from the hospital less than 1 week ago. -Recheck sodium levels in a.m.  COPD: Chronic.  Patient respiratory status stable at this time. -Albuterol nebs as needed  Depression: Patient not currently suicidal. -Continue Zoloft and trazodone  Thrombocytosis: Acute.  Platelet count 532 on admission.  Suspect this is reactive. -Recheck platelet count in a.m.  GERD -Recommend avoiding NSAIDs -Continue Protonix  Homelessness  DVT prophylaxis: Lovenox Code Status: Full Family Communication: No family present at bedside Disposition Plan: To be determined Consults called: None Admission status: Observation  Norval Morton MD [facility] Pager [phone]   If 7PM-7AM, please contact night-coverage [email or web address] Password [email or web address]  03/25/2019, 8:10 AM

## 2019-03-25 NOTE — ED Notes (Signed)
Pt head and face are red and swollen w/ 2nd degree sunburns.  Pt is covered in varicella, has tremors, reports nausea and a headache.  Provider aware.

## 2019-03-25 NOTE — ED Notes (Signed)
Pt appears to be resting comfortably for the first time, tremors decreased.

## 2019-03-26 DIAGNOSIS — Z20828 Contact with and (suspected) exposure to other viral communicable diseases: Secondary | ICD-10-CM | POA: Diagnosis present

## 2019-03-26 DIAGNOSIS — I2510 Atherosclerotic heart disease of native coronary artery without angina pectoris: Secondary | ICD-10-CM | POA: Diagnosis present

## 2019-03-26 DIAGNOSIS — I10 Essential (primary) hypertension: Secondary | ICD-10-CM | POA: Diagnosis present

## 2019-03-26 DIAGNOSIS — Z8673 Personal history of transient ischemic attack (TIA), and cerebral infarction without residual deficits: Secondary | ICD-10-CM | POA: Diagnosis not present

## 2019-03-26 DIAGNOSIS — R45851 Suicidal ideations: Secondary | ICD-10-CM | POA: Diagnosis present

## 2019-03-26 DIAGNOSIS — Z880 Allergy status to penicillin: Secondary | ICD-10-CM | POA: Diagnosis not present

## 2019-03-26 DIAGNOSIS — I252 Old myocardial infarction: Secondary | ICD-10-CM | POA: Diagnosis not present

## 2019-03-26 DIAGNOSIS — F17210 Nicotine dependence, cigarettes, uncomplicated: Secondary | ICD-10-CM | POA: Diagnosis present

## 2019-03-26 DIAGNOSIS — F1024 Alcohol dependence with alcohol-induced mood disorder: Secondary | ICD-10-CM | POA: Diagnosis not present

## 2019-03-26 DIAGNOSIS — E871 Hypo-osmolality and hyponatremia: Secondary | ICD-10-CM | POA: Diagnosis present

## 2019-03-26 DIAGNOSIS — Z91030 Bee allergy status: Secondary | ICD-10-CM | POA: Diagnosis not present

## 2019-03-26 DIAGNOSIS — Z590 Homelessness: Secondary | ICD-10-CM | POA: Diagnosis not present

## 2019-03-26 DIAGNOSIS — Z825 Family history of asthma and other chronic lower respiratory diseases: Secondary | ICD-10-CM | POA: Diagnosis not present

## 2019-03-26 DIAGNOSIS — R112 Nausea with vomiting, unspecified: Secondary | ICD-10-CM | POA: Diagnosis present

## 2019-03-26 DIAGNOSIS — F10239 Alcohol dependence with withdrawal, unspecified: Secondary | ICD-10-CM | POA: Diagnosis present

## 2019-03-26 DIAGNOSIS — D649 Anemia, unspecified: Secondary | ICD-10-CM | POA: Diagnosis present

## 2019-03-26 DIAGNOSIS — F1014 Alcohol abuse with alcohol-induced mood disorder: Secondary | ICD-10-CM | POA: Diagnosis not present

## 2019-03-26 DIAGNOSIS — F339 Major depressive disorder, recurrent, unspecified: Secondary | ICD-10-CM | POA: Diagnosis not present

## 2019-03-26 DIAGNOSIS — F419 Anxiety disorder, unspecified: Secondary | ICD-10-CM | POA: Diagnosis not present

## 2019-03-26 DIAGNOSIS — R296 Repeated falls: Secondary | ICD-10-CM | POA: Diagnosis present

## 2019-03-26 DIAGNOSIS — F1020 Alcohol dependence, uncomplicated: Secondary | ICD-10-CM | POA: Diagnosis not present

## 2019-03-26 DIAGNOSIS — G4700 Insomnia, unspecified: Secondary | ICD-10-CM | POA: Diagnosis present

## 2019-03-26 DIAGNOSIS — R1013 Epigastric pain: Secondary | ICD-10-CM | POA: Diagnosis present

## 2019-03-26 DIAGNOSIS — J449 Chronic obstructive pulmonary disease, unspecified: Secondary | ICD-10-CM | POA: Diagnosis present

## 2019-03-26 DIAGNOSIS — F1094 Alcohol use, unspecified with alcohol-induced mood disorder: Secondary | ICD-10-CM | POA: Diagnosis not present

## 2019-03-26 DIAGNOSIS — Z915 Personal history of self-harm: Secondary | ICD-10-CM | POA: Diagnosis not present

## 2019-03-26 DIAGNOSIS — K219 Gastro-esophageal reflux disease without esophagitis: Secondary | ICD-10-CM | POA: Diagnosis present

## 2019-03-26 DIAGNOSIS — Z823 Family history of stroke: Secondary | ICD-10-CM | POA: Diagnosis not present

## 2019-03-26 DIAGNOSIS — F332 Major depressive disorder, recurrent severe without psychotic features: Secondary | ICD-10-CM | POA: Diagnosis not present

## 2019-03-26 DIAGNOSIS — L551 Sunburn of second degree: Secondary | ICD-10-CM | POA: Diagnosis present

## 2019-03-26 DIAGNOSIS — D72829 Elevated white blood cell count, unspecified: Secondary | ICD-10-CM | POA: Diagnosis present

## 2019-03-26 LAB — CBC
HCT: 31.8 % — ABNORMAL LOW (ref 39.0–52.0)
Hemoglobin: 10.2 g/dL — ABNORMAL LOW (ref 13.0–17.0)
MCH: 28 pg (ref 26.0–34.0)
MCHC: 32.1 g/dL (ref 30.0–36.0)
MCV: 87.4 fL (ref 80.0–100.0)
Platelets: 444 10*3/uL — ABNORMAL HIGH (ref 150–400)
RBC: 3.64 MIL/uL — ABNORMAL LOW (ref 4.22–5.81)
RDW: 19.1 % — ABNORMAL HIGH (ref 11.5–15.5)
WBC: 7.7 10*3/uL (ref 4.0–10.5)
nRBC: 0 % (ref 0.0–0.2)

## 2019-03-26 LAB — BASIC METABOLIC PANEL
Anion gap: 7 (ref 5–15)
BUN: 12 mg/dL (ref 8–23)
CO2: 27 mmol/L (ref 22–32)
Calcium: 8.2 mg/dL — ABNORMAL LOW (ref 8.9–10.3)
Chloride: 102 mmol/L (ref 98–111)
Creatinine, Ser: 0.72 mg/dL (ref 0.61–1.24)
GFR calc Af Amer: >60 mL/min (ref >60)
GFR calc non Af Amer: >60 mL/min (ref >60)
Glucose, Bld: 80 mg/dL (ref 70–99)
Potassium: 3.8 mmol/L (ref 3.5–5.1)
Sodium: 136 mmol/L (ref 135–145)

## 2019-03-26 MED ORDER — SODIUM CHLORIDE 0.9 % IV SOLN
INTRAVENOUS | Status: DC
  Administered 2019-03-26 – 2019-03-28 (×3): via INTRAVENOUS

## 2019-03-26 NOTE — Plan of Care (Signed)
  Problem: Clinical Measurements: Goal: Will remain free from infection Outcome: Progressing Note: No s/s of infection noted. Goal: Respiratory complications will improve Outcome: Progressing Note: No s/s of respiratory complications noted.  Stable on room air.   

## 2019-03-26 NOTE — Progress Notes (Signed)
PROGRESS NOTE    Samuel Reyes  [id number] DOB: [birth year] DOA: 03/24/2019 PCP: [name], NP   Brief Narrative:  Patient is a 63 year old male with history of chronic alcohol abuse, hypertension, COPD, coronary artery disease, CVA, homelessness, tobacco abuse who presents with epigastric abdominal pain, nausea, vomiting.  He also developed blisters, ulcerations, desquamation on his whole body being outside in the sun.  Patient is homeless.  He was just discharged from here 6 days ago after being treated for severe hyponatremia requiring admission initially to ICU.  Patient was found to be in alcohol withdrawal with CIWA score of 15 on presentation.    Assessment & Plan:   Principal Problem:   Alcohol withdrawal ([place]) Active Problems:   COPD (chronic obstructive pulmonary disease) (HCC)   Depression   Hyponatremia   Nausea and vomiting   Tobacco abuse   Homelessness   Pressure injury of skin   Leukocytosis   Thrombocytosis (HCC)   Sunburn   Epigastric abdominal pain   Acute alcohol withdrawal: Presented with tremulousness, tachycardia.  He reports drinking 3-4 cases of 12 ounce beer.  Started on CIWA protocol.  This morning he was calm and cooperative.  Continue thiamine and folic acid  Epigastric abdominal pain/nausea, vomiting: Could be secondary to gastritis secondary to alcohol.  Denies any abdominal pain, nausea or vomiting this morning.  Continue gentle IV fluids  Leukocytosis: Mild.  Improving  Sunburn: Presented with significant sunburn on the face, legs.  Wound care nurse consulted.  Continue mupirocin ointment  Dizziness/falls: Reported feeling lightheaded, multiple falls.  Requested for physical therapy evaluation  Normocytic anemia: Stable  Hyponatremia: Mild.  Continue to monitor  COPD: Not in acute exacerbation at present.  Continue bronchodilators as needed  Depression: Continue Zoloft and trazodone  GERD: Continue  Protonix  Homelessness: Education officer, museum consulted.         DVT prophylaxis: Lovenox Code Status: Full Family Communication: None present at the bedside Disposition Plan: Currently homeless.  Social worker has been consulted for possible placement in a shelter.   Consultants: None  Procedures:None  Antimicrobials:  Anti-infectives (From admission, onward)   None      Subjective:  Patient seen and examined the bedside this morning.  Currently hemodynamically stable stable.  Looks comfortable.  Says he feels much better today.  Denies any abdomen pain, nausea or vomiting  Objective: Vitals:   03/25/19 1422 03/25/19 2022 03/26/19 0456 03/26/19 0936  BP: (!) 143/73 134/72 117/66 130/74  Pulse:  98 70   Resp:  20 19   Temp:  98.9 F (37.2 C) 98.6 F (37 C)   TempSrc:  Oral Oral   SpO2:  99% 97%   Weight:   68.4 kg   Height:        Intake/Output Summary (Last 24 hours) at 03/26/2019 1502 Last data filed at 03/26/2019 0940 Gross per 24 hour  Intake 480 ml  Output 800 ml  Net -320 ml   Filed Weights   03/25/19 0843 03/26/19 0456  Weight: 66.5 kg 68.4 kg    Examination:  General exam: ANot in distress,average built HEENT:PERRL,Oral mucosa moist, Ear/Nose normal on gross exam Respiratory system: Bilateral equal air entry, normal vesicular breath sounds, no wheezes or crackles  Cardiovascular system: S1 & S2 heard, RRR. No JVD, murmurs, rubs, gallops or clicks. No pedal edema. Gastrointestinal system: Abdomen is nondistended, soft and nontender. No organomegaly or masses felt. Normal bowel sounds heard. Central nervous system: Alert and oriented. No focal  neurological deficits. Extremities: No edema, no clubbing ,no cyanosis, distal peripheral pulses palpable. Skin: Multiple desquamations, blisters, sunburn    Data Reviewed: I have personally reviewed following labs and imaging studies  CBC: Recent Labs  Lab 03/25/19 0314 03/26/19 0303  WBC 12.4* 7.7   NEUTROABS 9.5*  --   HGB 12.1* 10.2*  HCT 37.5* 31.8*  MCV 86.6 87.4  PLT 532* [id number]*   Basic Metabolic Panel: Recent Labs  Lab 03/25/19 0314 03/25/19 0831 03/26/19 0303  NA 134*  --  136  K 4.4  --  3.8  CL 102  --  102  CO2 20*  --  27  GLUCOSE 90  --  80  BUN 9  --  12  CREATININE 0.82  --  0.72  CALCIUM 8.3*  --  8.2*  MG  --  1.9  --    GFR: Estimated Creatinine Clearance: 79.1 mL/min (by C-G formula based on SCr of 0.72 mg/dL). Liver Function Tests: Recent Labs  Lab 03/25/19 0314  AST 28  ALT 31  ALKPHOS 90  BILITOT 0.8  PROT 6.4*  ALBUMIN 3.3*   Recent Labs  Lab 03/25/19 0314  LIPASE 21   No results for input(s): AMMONIA in the last 168 hours. Coagulation Profile: No results for input(s): INR, PROTIME in the last 168 hours. Cardiac Enzymes: No results for input(s): CKTOTAL, CKMB, CKMBINDEX, TROPONINI in the last 168 hours. BNP (last 3 results) No results for input(s): PROBNP in the last 8760 hours. HbA1C: No results for input(s): HGBA1C in the last 72 hours. CBG: No results for input(s): GLUCAP in the last 168 hours. Lipid Profile: No results for input(s): CHOL, HDL, LDLCALC, TRIG, CHOLHDL, LDLDIRECT in the last 72 hours. Thyroid Function Tests: No results for input(s): TSH, T4TOTAL, FREET4, T3FREE, THYROIDAB in the last 72 hours. Anemia Panel: No results for input(s): VITAMINB12, FOLATE, FERRITIN, TIBC, IRON, RETICCTPCT in the last 72 hours. Sepsis Labs: No results for input(s): PROCALCITON, LATICACIDVEN in the last 168 hours.  Recent Results (from the past 240 hour(s))  SARS CORONAVIRUS 2 (TAT 6-12 HRS) Nasal Swab Aptima Multi Swab     Status: None   Collection Time: 03/25/19  8:21 AM   Specimen: Aptima Multi Swab; Nasal Swab  Result Value Ref Range Status   SARS Coronavirus 2 NEGATIVE NEGATIVE Final    Comment: (NOTE) SARS-CoV-2 target nucleic acids are NOT DETECTED. The SARS-CoV-2 RNA is generally detectable in upper and  lower respiratory specimens during the acute phase of infection. Negative results do not preclude SARS-CoV-2 infection, do not rule out co-infections with other pathogens, and should not be used as the sole basis for treatment or other patient management decisions. Negative results must be combined with clinical observations, patient history, and epidemiological information. The expected result is Negative. Fact Sheet for Patients: SugarRoll.be Fact Sheet for Healthcare Providers: https://www.woods-mathews.com/ This test is not yet approved or cleared by the [place] FDA and  has been authorized for detection and/or diagnosis of SARS-CoV-2 by FDA under an Emergency Use Authorization (EUA). This EUA will remain  in effect (meaning this test can be used) for the duration of the COVID-19 declaration under Section 56 4(b)(1) of the Act, 21 U.S.C. section 360bbb-3(b)(1), unless the authorization is terminated or revoked sooner. Performed at [facility], [place] [address], [place], [place] [address]          Radiology Studies: Ct Abdomen Pelvis W Contrast  Result Date: 03/25/2019 CLINICAL DATA:  Acute generalized  abdominal pain EXAM: CT ABDOMEN AND PELVIS WITH CONTRAST TECHNIQUE: Multidetector CT imaging of the abdomen and pelvis was performed using the standard protocol following bolus administration of intravenous contrast. CONTRAST:  182mL OMNIPAQUE IOHEXOL 300 MG/ML  SOLN COMPARISON:  10/25/2018 FINDINGS: Lower chest:  Minimal probable atelectasis at the right base. Hepatobiliary: Hepatic steatosis. No focal liver lesion.No evidence of biliary obstruction or stone. Pancreas: Unremarkable. Spleen: Unremarkable. Adrenals/Urinary Tract: Negative adrenals. No hydronephrosis or stone. Unremarkable bladder. Stomach/Bowel: No obstruction. No appendicitis. Clips around the GE junction. Vascular/Lymphatic: No acute vascular abnormality.  Atherosclerosis with moderate proximal SMA narrowing. No mass or adenopathy. Reproductive:Negative Other: No ascites or pneumoperitoneum. Right inguinal hernia repair using mesh. Musculoskeletal: No acute abnormalities. Remote bilateral posterior rib fractures. Advanced L1-2 and L5-S1 disc degeneration. IMPRESSION: 1. No acute finding. 2. Hepatic steatosis. 3. Atherosclerosis. Electronically Signed   By: Monte Fantasia M.D.   On: 03/25/2019 05:42        Scheduled Meds:  enoxaparin (LOVENOX) injection  40 mg Subcutaneous [id number]   folic acid  1 mg Oral Daily   LORazepam  0-4 mg Intravenous Q6H   Or   LORazepam  0-4 mg Oral Q6H   [START ON 03/27/2019] LORazepam  0-4 mg Intravenous Q12H   Or   [START ON 03/27/2019] LORazepam  0-4 mg Oral Q12H   multivitamin with minerals  1 tablet Oral Daily   mupirocin ointment   Topical BID   nicotine  14 mg Transdermal Daily   pantoprazole  40 mg Oral BID   sertraline  50 mg Oral Daily   sodium chloride flush  3 mL Intravenous Q12H   thiamine  100 mg Oral Daily   Or   thiamine  100 mg Intravenous Daily   traZODone  100 mg Oral QHS,MR X 1   Continuous Infusions:  sodium chloride 100 mL/hr at 03/26/19 1340     LOS: 0 days    Time spent: 35 mins.More than 50% of that time was spent in counseling and/or coordination of care.      Shelly Coss, MD [facility] Pager [phone]  If 7PM-7AM, please contact night-coverage [email or web address] Password [email or web address] 03/26/2019, 3:02 PM

## 2019-03-27 DIAGNOSIS — F1014 Alcohol abuse with alcohol-induced mood disorder: Secondary | ICD-10-CM

## 2019-03-27 DIAGNOSIS — R45851 Suicidal ideations: Secondary | ICD-10-CM

## 2019-03-27 DIAGNOSIS — F339 Major depressive disorder, recurrent, unspecified: Secondary | ICD-10-CM

## 2019-03-27 LAB — URINALYSIS, ROUTINE W REFLEX MICROSCOPIC
Bilirubin Urine: NEGATIVE
Glucose, UA: NEGATIVE mg/dL
Hgb urine dipstick: NEGATIVE
Ketones, ur: NEGATIVE mg/dL
Leukocytes,Ua: NEGATIVE
Nitrite: NEGATIVE
Protein, ur: NEGATIVE mg/dL
Specific Gravity, Urine: 1.013 (ref 1.005–1.030)
pH: 6 (ref 5.0–8.0)

## 2019-03-27 MED ORDER — GABAPENTIN 300 MG PO CAPS
300.0000 mg | ORAL_CAPSULE | Freq: Three times a day (TID) | ORAL | Status: DC
  Administered 2019-03-27 – 2019-03-31 (×12): 300 mg via ORAL
  Filled 2019-03-27 (×12): qty 1

## 2019-03-27 NOTE — Plan of Care (Signed)
  Problem: Clinical Measurements: Goal: Cardiovascular complication will be avoided Outcome: Progressing Note: No s/s of cardiovascular complication noted.  VSS.

## 2019-03-27 NOTE — Clinical Social Work Note (Signed)
CSW provided shelter list at bedside. Unfortunelty we did not have any size 8 shoes available to give to pt. Pt denies any further questions or concerns.  [place], [place]

## 2019-03-27 NOTE — Progress Notes (Signed)
PROGRESS NOTE    Samuel Reyes  [id number] DOB: 1956-03-18 DOA: 03/24/2019 PCP: [name], NP   Brief Narrative:  Patient is a 63 year old male with history of chronic alcohol abuse, hypertension, COPD, coronary artery disease, CVA, homelessness, tobacco abuse who presents with epigastric abdominal pain, nausea, vomiting.  He also developed blisters, ulcerations, desquamation on his whole body being outside in the sun.  Patient is homeless.  He was just discharged from here 6 days ago after being treated for severe hyponatremia requiring admission initially to ICU.  Patient was found to be in alcohol withdrawal with CIWA score of 15 on presentation.  He was admitted for alcohol withdrawal management.  Patient also appeared to be severely depressed and suicidal ideations.  Sitter ordered , psych consulted.   Assessment & Plan:   Principal Problem:   Alcohol withdrawal ([place]) Active Problems:   COPD (chronic obstructive pulmonary disease) (HCC)   Depression   Hyponatremia   Nausea and vomiting   Tobacco abuse   Homelessness   Pressure injury of skin   Leukocytosis   Thrombocytosis (HCC)   Sunburn   Epigastric abdominal pain   Acute alcohol withdrawal: Presented with tremulousness, tachycardia.  He reports drinking 3-4 cases of 12 ounce beer.  Started on CIWA protocol.  This morning he was calm and cooperative.  Continue thiamine and folic acid.  Depression: Appears depressed.  He admits of having suicidal ideation.  I have requested for psych consult.  Continue Engineer, materials.  Continue Zoloft and trazodone  Epigastric abdominal pain/nausea, vomiting: Could be secondary to gastritis secondary to alcohol.  Denies any abdominal pain, nausea or vomiting this morning.   Leukocytosis: Mild.  Improving  Sunburn: Presented with significant sunburn on the face, legs.  Wound care nurse consulted.  Continue mupirocin ointment  Dizziness/falls: Reported feeling lightheaded,  multiple falls.  Requested for physical therapy evaluation  Normocytic anemia: Stable  Hyponatremia: Mild.  Continue to monitor  COPD: Not in acute exacerbation at present.  Continue bronchodilators as needed  GERD: Continue Protonix  Homelessness: Education officer, museum consulted.         DVT prophylaxis: Lovenox Code Status: Full Family Communication: None present at the bedside Disposition Plan: Currently homeless.  Social worker has been consulted for possible placement in a shelter.Awaiting psychiatry evaluation   Consultants: None  Procedures:None  Antimicrobials:  Anti-infectives (From admission, onward)   None      Subjective:  Patient seen and examined the bedside this morning.  Hemodynamically stable.  Looks depressed.  Admits of having suicidal ideation.  Complains of pain on bilateral feet  Objective: Vitals:   03/26/19 2032 03/27/19 0300 03/27/19 0912 03/27/19 1324  BP: 137/71 137/83 (!) 152/81 139/66  Pulse: 100 90 97 94  Resp: 19 19 18 18   Temp: 98.7 F (37.1 C) 98.4 F (36.9 C) 98.2 F (36.8 C) 98.2 F (36.8 C)  TempSrc: Oral Oral Oral Oral  SpO2: 98% 98% 98% 100%  Weight:      Height:        Intake/Output Summary (Last 24 hours) at 03/27/2019 1356 Last data filed at 03/27/2019 1308 Gross per 24 hour  Intake 2636.08 ml  Output 2100 ml  Net 536.08 ml   Filed Weights   03/25/19 0843 03/26/19 0456  Weight: 66.5 kg 68.4 kg    Examination:  General exam: Not in distress,average built HEENT:PERRL,Oral mucosa moist, Ear/Nose normal on gross exam Respiratory system: Bilateral equal air entry, normal vesicular breath sounds, no wheezes or  crackles  Cardiovascular system: S1 & S2 heard, RRR. No JVD, murmurs, rubs, gallops or clicks. Gastrointestinal system: Abdomen is nondistended, soft and nontender. No organomegaly or masses felt. Normal bowel sounds heard. Central nervous system: Alert and oriented. No focal neurological deficits. Extremities:  No edema, no clubbing ,no cyanosis, distal peripheral pulses palpable. Skin: Multiple desquamations, blisters, sunburn Psychiatry: Depressed   Data Reviewed: I have personally reviewed following labs and imaging studies  CBC: Recent Labs  Lab 03/25/19 0314 03/26/19 0303  WBC 12.4* 7.7  NEUTROABS 9.5*  --   HGB 12.1* 10.2*  HCT 37.5* 31.8*  MCV 86.6 87.4  PLT 532* [id number]*   Basic Metabolic Panel: Recent Labs  Lab 03/25/19 0314 03/25/19 0831 03/26/19 0303  NA 134*  --  136  K 4.4  --  3.8  CL 102  --  102  CO2 20*  --  27  GLUCOSE 90  --  80  BUN 9  --  12  CREATININE 0.82  --  0.72  CALCIUM 8.3*  --  8.2*  MG  --  1.9  --    GFR: Estimated Creatinine Clearance: 79.1 mL/min (by C-G formula based on SCr of 0.72 mg/dL). Liver Function Tests: Recent Labs  Lab 03/25/19 0314  AST 28  ALT 31  ALKPHOS 90  BILITOT 0.8  PROT 6.4*  ALBUMIN 3.3*   Recent Labs  Lab 03/25/19 0314  LIPASE 21   No results for input(s): AMMONIA in the last 168 hours. Coagulation Profile: No results for input(s): INR, PROTIME in the last 168 hours. Cardiac Enzymes: No results for input(s): CKTOTAL, CKMB, CKMBINDEX, TROPONINI in the last 168 hours. BNP (last 3 results) No results for input(s): PROBNP in the last 8760 hours. HbA1C: No results for input(s): HGBA1C in the last 72 hours. CBG: No results for input(s): GLUCAP in the last 168 hours. Lipid Profile: No results for input(s): CHOL, HDL, LDLCALC, TRIG, CHOLHDL, LDLDIRECT in the last 72 hours. Thyroid Function Tests: No results for input(s): TSH, T4TOTAL, FREET4, T3FREE, THYROIDAB in the last 72 hours. Anemia Panel: No results for input(s): VITAMINB12, FOLATE, FERRITIN, TIBC, IRON, RETICCTPCT in the last 72 hours. Sepsis Labs: No results for input(s): PROCALCITON, LATICACIDVEN in the last 168 hours.  Recent Results (from the past 240 hour(s))  SARS CORONAVIRUS 2 (TAT 6-12 HRS) Nasal Swab Aptima Multi Swab     Status: None    Collection Time: 03/25/19  8:21 AM   Specimen: Aptima Multi Swab; Nasal Swab  Result Value Ref Range Status   SARS Coronavirus 2 NEGATIVE NEGATIVE Final    Comment: (NOTE) SARS-CoV-2 target nucleic acids are NOT DETECTED. The SARS-CoV-2 RNA is generally detectable in upper and lower respiratory specimens during the acute phase of infection. Negative results do not preclude SARS-CoV-2 infection, do not rule out co-infections with other pathogens, and should not be used as the sole basis for treatment or other patient management decisions. Negative results must be combined with clinical observations, patient history, and epidemiological information. The expected result is Negative. Fact Sheet for Patients: SugarRoll.be Fact Sheet for Healthcare Providers: https://www.woods-mathews.com/ This test is not yet approved or cleared by the [place] FDA and  has been authorized for detection and/or diagnosis of SARS-CoV-2 by FDA under an Emergency Use Authorization (EUA). This EUA will remain  in effect (meaning this test can be used) for the duration of the COVID-19 declaration under Section 56 4(b)(1) of the Act, 21 U.S.C. section 360bbb-3(b)(1), unless the authorization is  terminated or revoked sooner. Performed at [facility], [place] [address] [place], [place] [address]          Radiology Studies: No results found.      Scheduled Meds: . enoxaparin (LOVENOX) injection  40 mg Subcutaneous Q24H  . folic acid  1 mg Oral Daily  . LORazepam  0-4 mg Intravenous Q12H   Or  . LORazepam  0-4 mg Oral Q12H  . multivitamin with minerals  1 tablet Oral Daily  . mupirocin ointment   Topical BID  . nicotine  14 mg Transdermal Daily  . pantoprazole  40 mg Oral BID  . sertraline  50 mg Oral Daily  . sodium chloride flush  3 mL Intravenous Q12H  . thiamine  100 mg Oral Daily   Or  . thiamine  100 mg Intravenous Daily  . traZODone   100 mg Oral QHS,MR X 1   Continuous Infusions: . sodium chloride 100 mL/hr at 03/27/19 0012     LOS: 1 day    Time spent: 35 mins.More than 50% of that time was spent in counseling and/or coordination of care.      Shelly Coss, MD [facility] Pager [phone]  If 7PM-7AM, please contact night-coverage [email or web address] Password [email or web address] 03/27/2019, 1:56 PM

## 2019-03-27 NOTE — Consult Note (Signed)
Telepsych Consultation   Reason for Consult:  "depression,suicidal ideation" Referring Physician:  Dr. Shelly Coss  Location of Patient: [facility] Location of Provider: [facility]  Patient Identification: Samuel Reyes MRN:  [id number] Principal Diagnosis: Alcohol use with alcohol-induced mood disorder (Samuel Reyes) Diagnosis:  Principal Problem:   Alcohol withdrawal ([name]) Active Problems:   COPD (chronic obstructive pulmonary disease) ([place])   Depression   Hyponatremia   Nausea and vomiting   Tobacco abuse   Homelessness   Pressure injury of skin   Leukocytosis   Thrombocytosis (HCC)   Sunburn   Epigastric abdominal pain   Total Time spent with patient: 1 hour  Subjective:   Samuel Reyes is a 63 y.o. male patient admitted with alcohol withdrawal.  HPI:   Per chart review, patient was admitted with alcohol withdrawal. He developed blisters, ulcerations, desquamation on his whole body due to sunburn. He is homeless. He was treated for severe hyponatremia requiring ICU admission and discharged 6 days ago. He has a history of depression. Home medications include Zoloft 50 mg daily and Trazodone 100 mg qhs. Of note, patient was last evaluated by telepsych consult on 4/22 for SI. He was psychiatrically cleared with outpatient resources.    On interview, Samuel Reyes reports that he is tired of living. He reports onset of SI for the past week in the setting of ongoing medical problems and homelessness.  He reports, "I drink way too much." He denies illicit drug use. He has not been to [facility] for the past 1.5 years. He endorses SI with a plan to jump in front of a car. He denies HI. He reports seeing bugs on the walls. He reports poor sleep and fair appetite.    Past Psychiatric History: Alcohol abuse, MDD, bipolar disorder, schizophrenia and substance induced mood disorder. History of suicide attempts and last by overdose in 08/2018.   Risk to Self:  Yes endorses SI with a  plan.  Risk to Others:  None. Denies HI.  Prior Inpatient Therapy:  He was last hospitalized at [facility] in 09/2018 for SI.  Prior Outpatient Therapy:   He is followed by a PCP at [facility].   Past Medical History:  Past Medical History:  Diagnosis Date  . Alcoholism ([place])   . Barrett's esophagus   . CAD (coronary artery disease) 2002   MI, no intervention required  . COPD (chronic obstructive pulmonary disease) (HCC)    not on home O2  . Depression   . DTs (delirium tremens) ([place])   . GERD (gastroesophageal reflux disease)   . Headache   . Homeless   . Hypertension   . Stroke [facility]) 2006    Past Surgical History:  Procedure Laterality Date  . BACK SURGERY     3 cervical spine surgeries C4-C5 fused  . COLONOSCOPY N/A 01/04/2014   Procedure: COLONOSCOPY;  Surgeon: Danie Binder, MD;  Location: [facility];  Service: Endoscopy;  Laterality: N/A;  1:45  . ESOPHAGOGASTRODUODENOSCOPY N/A 01/04/2014   Procedure: ESOPHAGOGASTRODUODENOSCOPY (EGD);  Surgeon: Danie Binder, MD;  Location: [facility];  Service: Endoscopy;  Laterality: N/A;  . FINGER SURGERY Left    2nd, 3rd, & 4th fingers were cut off by table saw and reattached  . GASTRECTOMY    . HERNIA REPAIR    . INCISIONAL HERNIA REPAIR N/A 01/20/2014   Procedure: LAPAROSCOPIC RECURRENT  INCISIONAL HERNIA with mesh;  Surgeon: Edward Jolly, MD;  Location: [facility] ORS;  Service: General;  Laterality: N/A;  . rt knee arthroscopic surgery    . SHOULDER SURGERY Bilateral    3 surgeries on on left, 2 surgeries on right    Family History:  Family History  Problem Relation Age of Onset  . Cancer Father        bone  . Cancer Brother        lungs  . Stroke Maternal Grandmother   . Asthma Son        died at age 71 in his sleep   . Spina bifida Son        died at age 75   . Dementia Mother   . Colon cancer Neg Hx    Family Psychiatric  History: Sister-bipolar disorder  Social History:  Social History    Substance and Sexual Activity  Alcohol Use Yes  . Alcohol/week: 15.0 standard drinks  . Types: 15 Cans of beer per week   Comment: not drink for 8 weeks     Social History   Substance and Sexual Activity  Drug Use No   Comment: denied using any drugs    Social History   Socioeconomic History  . Marital status: Widowed    Spouse name: Not on file  . Number of children: 3  . Years of education: Not on file  . Highest education level: Not on file  Occupational History  . Occupation: Disability  Social Needs  . Financial resource strain: Not on file  . Food insecurity    Worry: Not on file    Inability: Not on file  . Transportation needs    Medical: Yes    Non-medical: Yes  Tobacco Use  . Smoking status: Current Every Day Smoker    Packs/day: 1.00    Years: 52.00    Pack years: 52.00    Types: Cigarettes    Start date: 07/30/1966  . Smokeless tobacco: Never Used  . Tobacco comment: uses nicotine patch  Substance and Sexual Activity  . Alcohol use: Yes    Alcohol/week: 15.0 standard drinks    Types: 15 Cans of beer per week    Comment: not drink for 8 weeks  . Drug use: No    Comment: denied using any drugs  . Sexual activity: Not Currently  Lifestyle  . Physical activity    Days per week: Not on file    Minutes per session: Not on file  . Stress: Not on file  Relationships  . Social Herbalist on phone: Not on file    Gets together: Not on file    Attends religious service: Not on file    Active member of club or organization: Not on file    Attends meetings of clubs or organizations: Not on file    Relationship status: Not on file  Other Topics Concern  . Not on file  Social History Narrative  . Not on file   Additional Social History: He is homeless. He receives Fish farm manager. He has a history of heavy alcohol use.     Allergies:   Allergies  Allergen Reactions  . Bee Venom Anaphylaxis  . Penicillins Rash    Has patient had a PCN  reaction causing immediate rash, facial/tongue/throat swelling, SOB or lightheadedness with hypotension: {Yes Has patient had a PCN reaction causing severe rash involving mucus membranes or skin necrosis: YES Has patient had a PCN reaction that required hospitalization Yes Has patient had a PCN reaction occurring within the last 10  years: YES If all of the above answers are "NO", then may proceed with Cephalosporin use.   . Vancomycin Tinitus    Labs:  Results for orders placed or performed during the hospital encounter of 03/24/19 (from the past 48 hour(s))  CBC     Status: Abnormal   Collection Time: 03/26/19  3:03 AM  Result Value Ref Range   WBC 7.7 4.0 - 10.5 K/uL   RBC 3.64 (L) 4.22 - 5.81 MIL/uL   Hemoglobin 10.2 (L) 13.0 - 17.0 g/dL   HCT 31.8 (L) 39.0 - 52.0 %   MCV 87.4 80.0 - 100.0 fL   MCH 28.0 26.0 - 34.0 pg   MCHC 32.1 30.0 - 36.0 g/dL   RDW 19.1 (H) 11.5 - 15.5 %   Platelets 444 (H) 150 - 400 K/uL   nRBC 0.0 0.0 - 0.2 %    Comment: Performed at [facility], [address], [place], [place] [id number]  Basic metabolic panel     Status: Abnormal   Collection Time: 03/26/19  3:03 AM  Result Value Ref Range   Sodium 136 135 - 145 mmol/L   Potassium 3.8 3.5 - 5.1 mmol/L   Chloride 102 98 - 111 mmol/L   CO2 27 22 - 32 mmol/L   Glucose, Bld 80 70 - 99 mg/dL   BUN 12 8 - 23 mg/dL   Creatinine, Ser 0.72 0.61 - 1.24 mg/dL   Calcium 8.2 (L) 8.9 - 10.3 mg/dL   GFR calc non Af Amer >60 >60 mL/min   GFR calc Af Amer >60 >60 mL/min   Anion gap 7 5 - 15    Comment: Performed at [facility], [place] [address] [place], [place] [address]  Urinalysis, Routine w reflex microscopic     Status: None   Collection Time: 03/27/19  2:13 AM  Result Value Ref Range   Color, Urine YELLOW YELLOW   APPearance CLEAR CLEAR   Specific Gravity, Urine 1.013 1.005 - 1.030   pH 6.0 5.0 - 8.0   Glucose, UA NEGATIVE NEGATIVE mg/dL   Hgb urine dipstick NEGATIVE NEGATIVE   Bilirubin  Urine NEGATIVE NEGATIVE   Ketones, ur NEGATIVE NEGATIVE mg/dL   Protein, ur NEGATIVE NEGATIVE mg/dL   Nitrite NEGATIVE NEGATIVE   Leukocytes,Ua NEGATIVE NEGATIVE    Comment: Performed at [facility] [address], [place], [place] [address]    Medications:  Current Facility-Administered Medications  Medication Dose Route Frequency Provider Last Rate Last Dose  . 0.9 %  sodium chloride infusion   Intravenous Continuous Shelly Coss, MD 100 mL/hr at 03/27/19 0012    . acetaminophen (TYLENOL) tablet 650 mg  650 mg Oral Q6H PRN Fuller Plan A, MD   650 mg at 03/27/19 [id number]   Or  . acetaminophen (TYLENOL) suppository 650 mg  650 mg Rectal Q6H PRN Fuller Plan A, MD      . albuterol (PROVENTIL) (2.5 MG/3ML) 0.083% nebulizer solution 2.5 mg  2.5 mg Nebulization Q6H PRN Smith, Rondell A, MD      . enoxaparin (LOVENOX) injection 40 mg  40 mg Subcutaneous Q24H Smith, Rondell A, MD   40 mg at 03/27/19 0911  . folic acid (FOLVITE) tablet 1 mg  1 mg Oral Daily Tamala Julian, Rondell A, MD   1 mg at 03/27/19 0910  . LORazepam (ATIVAN) injection 0-4 mg  0-4 mg Intravenous Q12H Norval Morton, MD       Or  . LORazepam (  ATIVAN) tablet 0-4 mg  0-4 mg Oral Q12H Smith, Rondell A, MD   1 mg at 03/27/19 [id number]  . multivitamin with minerals tablet 1 tablet  1 tablet Oral Daily Fuller Plan A, MD   1 tablet at 03/27/19 0910  . mupirocin ointment (BACTROBAN) 2 %   Topical BID Smith, Rondell A, MD      . nicotine (NICODERM CQ - dosed in mg/24 hours) patch 14 mg  14 mg Transdermal Daily Fuller Plan A, MD   14 mg at 03/27/19 0910  . pantoprazole (PROTONIX) EC tablet 40 mg  40 mg Oral BID Fuller Plan A, MD   40 mg at 03/27/19 0910  . sertraline (ZOLOFT) tablet 50 mg  50 mg Oral Daily Fuller Plan A, MD   50 mg at 03/27/19 0910  . sodium chloride flush (NS) 0.9 % injection 3 mL  3 mL Intravenous Q12H Smith, Rondell A, MD   3 mL at 03/25/19 2159  . thiamine (VITAMIN B-1) tablet 100 mg  100 mg Oral Daily  Tamala Julian, Rondell A, MD   100 mg at 03/27/19 [id number]   Or  . thiamine (B-1) injection 100 mg  100 mg Intravenous Daily Smith, Rondell A, MD      . traZODone (DESYREL) tablet 100 mg  100 mg Oral QHS,MR X 1 Smith, Rondell A, MD   100 mg at 03/26/19 2209    Musculoskeletal: Strength & Muscle Tone: No atrophy noted. Gait & Station: UTA since patient is lying in bed. Patient leans: N/A  Psychiatric Specialty Exam: Physical Exam  Nursing note and vitals reviewed. Constitutional: He is oriented to person, place, and time. He appears well-developed and well-nourished.  HENT:  Head: Normocephalic and atraumatic.  Neck: Normal range of motion.  Respiratory: Effort normal.  Musculoskeletal: Normal range of motion.  Neurological: He is alert and oriented to person, place, and time.  Psychiatric: His speech is normal and behavior is normal. Cognition and memory are normal. He expresses impulsivity. He exhibits a depressed mood. He expresses suicidal ideation. He expresses suicidal plans.    Review of Systems  Cardiovascular: Positive for chest pain.  Gastrointestinal: Negative for abdominal pain, constipation, diarrhea, nausea and vomiting.  Musculoskeletal:       Bilateral ankle pain due to sunburn.  Psychiatric/Behavioral: Positive for depression, hallucinations (VH), substance abuse and suicidal ideas. The patient has insomnia.   All other systems reviewed and are negative.   Blood pressure (!) 152/81, pulse 97, temperature 98.2 F (36.8 C), temperature source Oral, resp. rate 18, height 5\' 4"  (1.626 m), weight 68.4 kg, SpO2 98 %.Body mass index is 25.88 kg/m.  General Appearance: Disheveled, middle aged, Caucasian male with diffuse sunburn and corrective lenses who is lying in bed. NAD.   Eye Contact:  Good  Speech:  Clear and Coherent and Normal Rate  Volume:  Normal  Mood:  Depressed  Affect:  Constricted  Thought Process:  Goal Directed, Linear and Descriptions of Associations: Intact   Orientation:  Full (Time, Place, and Person)  Thought Content:  Logical  Suicidal Thoughts:  Yes.  with intent/plan  Homicidal Thoughts:  No  Memory:  Immediate;   Good Recent;   Good Remote;   Good  Judgement:  Fair  Insight:  Fair  Psychomotor Activity:  Normal  Concentration:  Concentration: Good and Attention Span: Good  Recall:  Good  Fund of Knowledge:  Good  Language:  Good  Akathisia:  No  Handed:  Right  AIMS (if  indicated):   N/A  Assets:  Communication Skills Desire for Improvement Financial Resources/Insurance  ADL's:  Intact  Cognition:  WNL  Sleep:   Poor   Assessment:  Samuel Reyes is a 63 y.o. .male who was admitted with alcohol withdrawal. BAL and UDS were not obtained on admission. Patient endorses SI with a plan to jump in front of a car. He endorses depression secondary to multiple psychosocial stressors. He warrants inpatient psychiatric hospitalization for stabilization and treatment.   Treatment Plan Summary: -Patient warrants inpatient psychiatric hospitalization given high risk of harm to self. -Continue bedside sitter.  -Continue home Zoloft 50 mg daily for mood and Trazodone 100 mg qhs for insomnia. -Consider Gabapentin 300 mg TID for alcohol use.  -EKG reviewed and QTc 463 on 8/26. Please closely monitor when starting or increasing QTc prolonging agents.  -Please pursue involuntary commitment if patient refuses voluntary psychiatric hospitalization or attempts to leave the hospital.  -Will sign off on patient at this time. Please consult psychiatry again as needed.     Disposition: Recommend psychiatric Inpatient admission when medically cleared.  This service was provided via telemedicine using a 2-way, interactive audio and video technology.  Names of all persons participating in this telemedicine service and their role in this encounter. Name: Buford Dresser, DO Role: Psychiatrist   Name: Samuel Reyes Role: Patient    Faythe Dingwall, DO 03/27/2019 1:07 PM

## 2019-03-27 NOTE — TOC Initial Note (Signed)
Transition of Care [facility]) - Initial/Assessment Note    Patient Details  Name: Samuel Reyes MRN: [id number] Date of Birth: Jan 20, 1956  Transition of Care [facility]) CM/SW Contact:    Eileen Stanford, LCSW Phone Number: 03/27/2019, 3:58 PM  Clinical Narrative:    CSW spoke with pt at bedside. Pt is alert and oriented. Psych is recommending inpatient placement. Pt is aware and agreeable. Pt signed voluntary admission and consent for treatment form, CSW witnessed. CSW sent referral to  [place],  [place],  [facility],  [place],  [place],  [place],   [place],  [place],  [place] to follow up with [facility] tomorrow.           Expected Discharge Plan: [facility]iatric Hospital Barriers to Discharge: Continued Medical Work up   Patient Goals and CMS Choice        Expected Discharge Plan and Services Expected Discharge Plan: [facility] In-house Referral: Clinical Social Work   Post Acute Care Choice: NA(psychiatry) Living arrangements for the past 2 months: Homeless                                      Prior Living Arrangements/Services Living arrangements for the past 2 months: Homeless   Patient language and need for interpreter reviewed:: Yes Do you feel safe going back to the place where you live?: No   homeless  Need for Family Participation in Patient Care: Yes (Comment)     Criminal Activity/Legal Involvement Pertinent to Current Situation/Hospitalization: No - Comment as needed  Activities of Daily Living Home Assistive Devices/Equipment: None ADL Screening (condition at time of admission) Patient's cognitive ability adequate to safely complete daily activities?: Yes Is the patient deaf or have difficulty hearing?: No Does the patient have difficulty seeing, even when wearing glasses/contacts?: No Does the patient have difficulty concentrating, remembering, or making decisions?: No Patient able to express need for assistance  with ADLs?: Yes Does the patient have difficulty dressing or bathing?: No Independently performs ADLs?: Yes (appropriate for developmental age) Does the patient have difficulty walking or climbing stairs?: No Weakness of Legs: Both Weakness of Arms/Hands: None  Permission Sought/Granted Permission sought to share information with : Psychiatrist, Chartered certified accountant granted to share information with : Yes, Verbal Permission Granted     Permission granted to share info w AGENCY: [facility]        Emotional Assessment Appearance:: Appears stated age Attitude/Demeanor/Rapport: Engaged Affect (typically observed): Accepting, Appropriate, Calm Orientation: : Oriented to Self, Oriented to Place, Oriented to  Time, Oriented to Situation Alcohol / Substance Use: Not Applicable Psych Involvement: Yes (comment)(recommending inpatient bhh)  Admission diagnosis:  Epigastric pain [R10.13] Acute hyperactive alcohol withdrawal delirium ([place]) [F10.231] Patient Active Problem List   Diagnosis Date Noted  . Pressure injury of skin 03/25/2019  . Leukocytosis 03/25/2019  . Thrombocytosis ([place]) 03/25/2019  . Sunburn 03/25/2019  . Epigastric abdominal pain 03/25/2019  . QT prolongation 09/09/2018  . Overdose 09/06/2018  . Substance induced mood disorder ([place]) 07/25/2018  . MDD (major depressive disorder), recurrent episode, severe ([place]) 07/18/2018  . MDD (major depressive disorder), recurrent severe, without psychosis ([place]) 08/26/2017  . Alcohol withdrawal ([place]) 08/08/2017  . COPD with acute bronchitis ([place]) 07/29/2017  . Alcohol use   . Homelessness 06/24/2017  . Lung nodule 09/03/2016  . Tobacco abuse 09/03/2016  . Insomnia 09/03/2016  . HCAP (  healthcare-associated pneumonia) 08/22/2016  . Bronchiolitis 08/21/2016  . Malnutrition of moderate degree 08/04/2016  . Atrial tachycardia ([place]) 08/02/2016  . Impaired glucose tolerance 08/02/2016  . Alcohol use with alcohol-induced  mood disorder ([place]) 12/12/2015  . Syncope 11/15/2015  . Chest pain 11/15/2015  . Nausea and vomiting 11/15/2015  . Abdominal pain 11/15/2015  . Major depressive disorder, recurrent episode, moderate with anxious distress ([place]) 10/30/2015  . Alcohol use disorder, severe, dependence ([place]) 10/29/2015  . COPD exacerbation ([place]) 09/20/2015  . Acute respiratory failure with hypoxia ([place]) 09/18/2015  . Suicidal ideation 09/17/2015  . Alcohol intoxication ([place]) 09/17/2015  . Depression 09/17/2015  . Benign essential HTN 09/17/2015  . Hypokalemia 09/17/2015  . Hyponatremia 09/17/2015  . Coffee ground emesis 09/17/2015  . COPD (chronic obstructive pulmonary disease) ([place]) 02/17/2013  . GERD (gastroesophageal reflux disease) 02/17/2013   PCP:  [name], NP Pharmacy:   [facility], [place] [address] Phone: [phone] Fax: [place], [place] [address] [place] [address] Phone: [phone] Fax: [phone]     Social Determinants of Health (SDOH) Interventions    Readmission Risk Interventions No flowsheet data found.

## 2019-03-28 MED ORDER — CEPHALEXIN 500 MG PO CAPS
500.0000 mg | ORAL_CAPSULE | Freq: Three times a day (TID) | ORAL | Status: DC
  Administered 2019-03-28 – 2019-03-31 (×9): 500 mg via ORAL
  Filled 2019-03-28 (×10): qty 1

## 2019-03-28 NOTE — Progress Notes (Signed)
PROGRESS NOTE    Samuel Reyes  [id number] DOB: [birth year] DOA: 03/24/2019 PCP: [name], NP   Brief Narrative:  Patient is a 63 year old male with history of chronic alcohol abuse, hypertension, COPD, coronary artery disease, CVA, homelessness, tobacco abuse who presents with epigastric abdominal pain, nausea, vomiting.  He also developed blisters, ulcerations, desquamation on his whole body being outside in the sun.  Patient is homeless.  He was just discharged from here 6 days ago after being treated for severe hyponatremia requiring admission initially to ICU.  Patient was found to be in alcohol withdrawal with CIWA score of 15 on presentation.  He was admitted for alcohol withdrawal management.  Patient also appeared to be severely depressed and suicidal ideations.  Sitter ordered , psych consulted.  After psych evaluation, inpatient psych admission has been recommended. Patient is medically cleared for inpatient psych admission as soon as bed is available.   Assessment & Plan:   Principal Problem:   Alcohol use with alcohol-induced mood disorder (HCC) Active Problems:   COPD (chronic obstructive pulmonary disease) (HCC)   Depression   Hyponatremia   Nausea and vomiting   Tobacco abuse   Homelessness   Alcohol withdrawal (HCC)   Pressure injury of skin   Leukocytosis   Thrombocytosis (HCC)   Sunburn   Epigastric abdominal pain   Acute alcohol withdrawal: Presented with tremulousness, tachycardia.  He reports drinking 3-4 cases of 12 ounce beer.  Started on CIWA protocol.  This morning he was calm and cooperative.  Continue thiamine and folic acid.Started on gabapentin for chronic alcoholism.  Depression: Appears depressed.  He admits of having suicidal ideation.  Psych recommended inpatient psych admission.  Continue Engineer, materials.  Continue Zoloft and trazodone  Epigastric abdominal pain/nausea, vomiting: Could be secondary to gastritis secondary to alcohol.   Denies any abdominal pain, nausea or vomiting this morning.   Sunburn: Presented with significant sunburn on the face, legs.  Wound care nurse consulted.  Continue mupirocin ointment.  Ulcerations on the bilateral lower extremity noted with scant bleeding.Started on keflex.  Normocytic anemia: Stable  Hyponatremia: Mild.  Continue to monitor  COPD: Not in acute exacerbation at present.  Continue bronchodilators as needed  GERD: Continue Protonix  Homelessness: Education officer, museum consulted and following.         DVT prophylaxis: Lovenox Code Status: Full Family Communication: None present at the bedside Disposition Plan: Inpatient as soon as bed is available.  Consultants: None  Procedures:None  Antimicrobials:  Anti-infectives (From admission, onward)   None      Subjective:  Patient seen and examined the bedside this morning.  Hemodynamically stable, comfortable.  Sitter at the bedside.  The ulcerations on his bilateral lower extremities were bleeding when he was trying to sit on the commode.  Objective: Vitals:   03/27/19 1324 03/27/19 1648 03/27/19 2004 03/28/19 0927  BP: 139/66 128/71 (!) 142/98 128/87  Pulse: 94 98 87 98  Resp: 18 20 19 18   Temp: 98.2 F (36.8 C) 98.5 F (36.9 C) 98.3 F (36.8 C)   TempSrc: Oral Oral Oral   SpO2: 100%  96% 99%  Weight:      Height:        Intake/Output Summary (Last 24 hours) at 03/28/2019 1306 Last data filed at 03/28/2019 0758 Gross per 24 hour  Intake 480 ml  Output 1825 ml  Net -1345 ml   Filed Weights   03/25/19 0843 03/26/19 0456  Weight: 66.5 kg 68.4 kg  Examination:  General exam: Not in distress,average built HEENT:PERRL,Oral mucosa moist, Ear/Nose normal on gross exam Respiratory system: Bilateral equal air entry, normal vesicular breath sounds, no wheezes or crackles  Cardiovascular system: S1 & S2 heard, RRR. No JVD, murmurs, rubs, gallops or clicks. Gastrointestinal system: Abdomen is nondistended,  soft and nontender. No organomegaly or masses felt. Normal bowel sounds heard. Central nervous system: Alert and oriented. No focal neurological deficits. Extremities: No edema, no clubbing ,no cyanosis, distal peripheral pulses palpable. Skin: Multiple desquamations, blisters, sunburn Ulcers on the bilateral lower extremities Psychiatry: Depressed   Data Reviewed: I have personally reviewed following labs and imaging studies  CBC: Recent Labs  Lab 03/25/19 0314 03/26/19 0303  WBC 12.4* 7.7  NEUTROABS 9.5*  --   HGB 12.1* 10.2*  HCT 37.5* 31.8*  MCV 86.6 87.4  PLT 532* [id number]*   Basic Metabolic Panel: Recent Labs  Lab 03/25/19 0314 03/25/19 0831 03/26/19 0303  NA 134*  --  136  K 4.4  --  3.8  CL 102  --  102  CO2 20*  --  27  GLUCOSE 90  --  80  BUN 9  --  12  CREATININE 0.82  --  0.72  CALCIUM 8.3*  --  8.2*  MG  --  1.9  --    GFR: Estimated Creatinine Clearance: 79.1 mL/min (by C-G formula based on SCr of 0.72 mg/dL). Liver Function Tests: Recent Labs  Lab 03/25/19 0314  AST 28  ALT 31  ALKPHOS 90  BILITOT 0.8  PROT 6.4*  ALBUMIN 3.3*   Recent Labs  Lab 03/25/19 0314  LIPASE 21   No results for input(s): AMMONIA in the last 168 hours. Coagulation Profile: No results for input(s): INR, PROTIME in the last 168 hours. Cardiac Enzymes: No results for input(s): CKTOTAL, CKMB, CKMBINDEX, TROPONINI in the last 168 hours. BNP (last 3 results) No results for input(s): PROBNP in the last 8760 hours. HbA1C: No results for input(s): HGBA1C in the last 72 hours. CBG: No results for input(s): GLUCAP in the last 168 hours. Lipid Profile: No results for input(s): CHOL, HDL, LDLCALC, TRIG, CHOLHDL, LDLDIRECT in the last 72 hours. Thyroid Function Tests: No results for input(s): TSH, T4TOTAL, FREET4, T3FREE, THYROIDAB in the last 72 hours. Anemia Panel: No results for input(s): VITAMINB12, FOLATE, FERRITIN, TIBC, IRON, RETICCTPCT in the last 72 hours. Sepsis  Labs: No results for input(s): PROCALCITON, LATICACIDVEN in the last 168 hours.  Recent Results (from the past 240 hour(s))  SARS CORONAVIRUS 2 (TAT 6-12 HRS) Nasal Swab Aptima Multi Swab     Status: None   Collection Time: 03/25/19  8:21 AM   Specimen: Aptima Multi Swab; Nasal Swab  Result Value Ref Range Status   SARS Coronavirus 2 NEGATIVE NEGATIVE Final    Comment: (NOTE) SARS-CoV-2 target nucleic acids are NOT DETECTED. The SARS-CoV-2 RNA is generally detectable in upper and lower respiratory specimens during the acute phase of infection. Negative results do not preclude SARS-CoV-2 infection, do not rule out co-infections with other pathogens, and should not be used as the sole basis for treatment or other patient management decisions. Negative results must be combined with clinical observations, patient history, and epidemiological information. The expected result is Negative. Fact Sheet for Patients: SugarRoll.be Fact Sheet for Healthcare Providers: https://www.woods-mathews.com/ This test is not yet approved or cleared by the [place] FDA and  has been authorized for detection and/or diagnosis of SARS-CoV-2 by FDA under an Emergency Use Authorization (EUA).  This EUA will remain  in effect (meaning this test can be used) for the duration of the COVID-19 declaration under Section 56 4(b)(1) of the Act, 21 U.S.C. section 360bbb-3(b)(1), unless the authorization is terminated or revoked sooner. Performed at [facility], [place] [address] [place], [place] [address]          Radiology Studies: No results found.      Scheduled Meds: . enoxaparin (LOVENOX) injection  40 mg Subcutaneous Q24H  . folic acid  1 mg Oral Daily  . gabapentin  300 mg Oral TID  . LORazepam  0-4 mg Intravenous Q12H   Or  . LORazepam  0-4 mg Oral Q12H  . multivitamin with minerals  1 tablet Oral Daily  . mupirocin ointment   Topical BID  .  nicotine  14 mg Transdermal Daily  . pantoprazole  40 mg Oral BID  . sertraline  50 mg Oral Daily  . sodium chloride flush  3 mL Intravenous Q12H  . thiamine  100 mg Oral Daily   Or  . thiamine  100 mg Intravenous Daily  . traZODone  100 mg Oral QHS,MR X 1   Continuous Infusions:    LOS: 2 days    Time spent: 35 mins.More than 50% of that time was spent in counseling and/or coordination of care.      Shelly Coss, MD [facility] Pager [phone]  If 7PM-7AM, please contact night-coverage [email or web address] Password [email or web address] 03/28/2019, 1:06 PM

## 2019-03-28 NOTE — TOC Progression Note (Signed)
Transition of Care [facility]) - Progression Note    Patient Details  Name: Samuel Reyes MRN: [id number] Date of Birth: 1955-09-11  Transition of Care [facility]) CM/SW [employer], LCSW Phone Number:[phone] 03/28/2019, 11:59 AM  Clinical Narrative:     CSW called [facility] and was notified that they currently do not have a bed available. Chrys Racer stated that she would look at patient's chart and alerted CSW if a bed comes available. CSW checked hub and no bed offers are back.   CSW will continue to monitor for discharge planning.  Expected Discharge Plan: Psychiatric Hospital Barriers to Discharge: Continued Medical Work up  Expected Discharge Plan and Services Expected Discharge Plan: Psychiatric Hospital In-house Referral: Clinical Social Work   Post Acute Care Choice: NA(psychiatry) Living arrangements for the past 2 months: Homeless                                       Social Determinants of Health (SDOH) Interventions    Readmission Risk Interventions No flowsheet data found.

## 2019-03-29 DIAGNOSIS — F1094 Alcohol use, unspecified with alcohol-induced mood disorder: Secondary | ICD-10-CM

## 2019-03-29 MED ORDER — CEPHALEXIN 500 MG PO CAPS: 500.0000 mg | ORAL_CAPSULE | Freq: Three times a day (TID) | ORAL | 0 refills | Status: DC

## 2019-03-29 MED ORDER — OXYCODONE HCL 5 MG PO TABS: 5.0000 mg | ORAL_TABLET | ORAL | 0 refills | Status: DC | PRN

## 2019-03-29 MED ORDER — TRAZODONE HCL 100 MG PO TABS: 100.0000 mg | ORAL_TABLET | Freq: Every evening | ORAL | Status: DC | PRN

## 2019-03-29 MED ORDER — OXYCODONE HCL 5 MG PO TABS
5.0000 mg | ORAL_TABLET | ORAL | Status: DC | PRN
  Administered 2019-03-29 – 2019-03-31 (×5): 5 mg via ORAL
  Filled 2019-03-29 (×5): qty 1

## 2019-03-29 MED ORDER — GABAPENTIN 300 MG PO CAPS: 300.0000 mg | ORAL_CAPSULE | Freq: Three times a day (TID) | ORAL | Status: DC

## 2019-03-29 NOTE — Discharge Summary (Addendum)
Physician Discharge Summary  Samuel Reyes [id number] DOB: Jul 12, 1956 DOA: 03/24/2019  PCP: [name], NP  Admit date: 03/24/2019 Discharge date: 03/30/19 Admitted From: Home Disposition:  Home  Discharge Condition:Stable CODE STATUS:FULL Diet recommendation: Regular   Brief/Interim Summary:  Patient is a 63 year old male with history of chronic alcohol abuse, hypertension, COPD, coronary artery disease, CVA, homelessness, tobacco abuse who presents with epigastric abdominal pain, nausea, vomiting.  He also developed blisters, ulcerations, desquamation on his whole body being outside in the sun.  Patient is homeless.  He was just discharged from here 6 days ago after being treated for severe hyponatremia requiring admission initially to ICU.  Patient was found to be in alcohol withdrawal with CIWA score of 15 on presentation.  He was admitted for alcohol withdrawal management.  Patient also appeared to be severely depressed and suicidal ideations.  Sitter ordered , psych consulted.  After psych evaluation, inpatient psych admission has been recommended. Patient is medically cleared for inpatient psych admission .  Following problems were addressed during his hospitalization:  Acute alcohol withdrawal: Presented with tremulousness, tachycardia.  He reports drinking 3-4 cases of 12 ounce beer.  Started on CIWA protocol.  This morning he was calm and cooperative.  Continue thiamine and folic acid.Started on gabapentin for chronic alcoholism.  Depression: Appears depressed.  He admits of having suicidal ideation.  Psych recommended inpatient psych admission.  Continue Engineer, materials.  Continue Zoloft and trazodone  Epigastric abdominal pain/nausea, vomiting: Could be secondary to gastritis secondary to alcohol.  Denies any abdominal pain, nausea or vomiting this morning.   Sunburn: Presented with significant sunburn on the face, legs.  Wound care nurse consulted.  Continue  mupirocin ointment.  Ulcerations on the bilateral lower extremity noted with scant bleeding but healing well.Started on keflex.  Normocytic anemia: Stable  Hyponatremia: Mild.  Continue to monitor  COPD: Not in acute exacerbation at present.  Continue bronchodilators as needed  GERD: Continue Protonix  Homelessness: Education officer, museum consulted and following.    Discharge Diagnoses:  Principal Problem:   Alcohol use with alcohol-induced mood disorder (HCC) Active Problems:   COPD (chronic obstructive pulmonary disease) (HCC)   Depression   Hyponatremia   Nausea and vomiting   Tobacco abuse   Homelessness   Alcohol withdrawal (HCC)   Pressure injury of skin   Leukocytosis   Thrombocytosis (HCC)   Sunburn   Epigastric abdominal pain    Discharge Instructions  Discharge Instructions    Diet - low sodium heart healthy   Complete by: As directed    Discharge instructions   Complete by: As directed    1) Take prescribed medications as instructed.   Increase activity slowly   Complete by: As directed      Allergies as of 03/29/2019      Reactions   Bee Venom Anaphylaxis   Penicillins Rash   Has patient had a PCN reaction causing immediate rash, facial/tongue/throat swelling, SOB or lightheadedness with hypotension: {Yes Has patient had a PCN reaction causing severe rash involving mucus membranes or skin necrosis: YES Has patient had a PCN reaction that required hospitalization Yes Has patient had a PCN reaction occurring within the last 10 years: YES If all of the above answers are "NO", then may proceed with Cephalosporin use.   Vancomycin Tinitus      Medication List    TAKE these medications   albuterol 108 (90 Base) MCG/ACT inhaler Commonly known as: VENTOLIN HFA Inhale 2 puffs into the lungs  every 4 (four) hours as needed for wheezing or shortness of breath.   cephALEXin 500 MG capsule Commonly known as: KEFLEX Take 1 capsule (500 mg total) by mouth  every 8 (eight) hours for 3 days. Start taking on: August 31, [id number]   folic acid 1 MG tablet Commonly known as: FOLVITE Take 1 tablet (1 mg total) by mouth daily.   gabapentin 300 MG capsule Commonly known as: NEURONTIN Take 1 capsule (300 mg total) by mouth 3 (three) times daily.   GOODY HEADACHE PO Take 1 packet by mouth daily as needed (headache).   multivitamin with minerals Tabs tablet Take 1 tablet by mouth daily.   nicotine 14 mg/24hr patch Commonly known as: NICODERM CQ - dosed in mg/24 hours Place 1 patch (14 mg total) onto the skin daily.   oxyCODONE 5 MG immediate release tablet Commonly known as: Oxy IR/ROXICODONE Take 1 tablet (5 mg total) by mouth every 4 (four) hours as needed for moderate pain.   pantoprazole 40 MG tablet Commonly known as: PROTONIX Take 1 tablet (40 mg total) by mouth 2 (two) times daily. For reflux   sertraline 50 MG tablet Commonly known as: ZOLOFT Take 1 tablet (50 mg total) by mouth daily. For mood   thiamine 100 MG tablet Take 1 tablet (100 mg total) by mouth daily.   traZODone 100 MG tablet Commonly known as: DESYREL Take 1 tablet (100 mg total) by mouth at bedtime and may repeat dose one time if needed. What changed:   medication strength  additional instructions      Follow-up Information    [name], NP. Schedule an appointment as soon as possible for a visit in 2 week(s).   Specialty: Internal Medicine Contact information: [id number] [place] [address] [phone]          Allergies  Allergen Reactions  . Bee Venom Anaphylaxis  . Penicillins Rash    Has patient had a PCN reaction causing immediate rash, facial/tongue/throat swelling, SOB or lightheadedness with hypotension: {Yes Has patient had a PCN reaction causing severe rash involving mucus membranes or skin necrosis: YES Has patient had a PCN reaction that required hospitalization Yes Has patient had a PCN reaction occurring within  the last 10 years: YES If all of the above answers are "NO", then may proceed with Cephalosporin use.   . Vancomycin Tinitus    Consultations:  Psychiatry   Procedures/Studies: Dg Chest 2 View  Result Date: 03/18/2019 CLINICAL DATA:  Chest pain, cough EXAM: CHEST - 2 VIEW COMPARISON:  03/13/2019 FINDINGS: The heart size and mediastinal contours are within normal limits. Both lungs are clear. The visualized skeletal structures are unremarkable. IMPRESSION: No acute abnormality of the lungs. Electronically Signed   By: Eddie Candle M.D.   On: 03/18/2019 09:42   Dg Chest 2 View  Result Date: 03/13/2019 CLINICAL DATA:  Chest pain EXAM: CHEST - 2 VIEW COMPARISON:  12/31/2018 FINDINGS: The heart size and mediastinal contours are within normal limits. Both lungs are clear. Clips at the GE junction and left upper quadrant. IMPRESSION: No active cardiopulmonary disease. Electronically Signed   By: Donavan Foil M.D.   On: 03/13/2019 13:56   Ct Head Wo Contrast  Result Date: 03/13/2019 CLINICAL DATA:  Head laceration after fall. EXAM: CT HEAD WITHOUT CONTRAST CT MAXILLOFACIAL WITHOUT CONTRAST CT CERVICAL SPINE WITHOUT CONTRAST TECHNIQUE: Multidetector CT imaging of the head, cervical spine, and maxillofacial structures were performed using the standard protocol without intravenous contrast. Multiplanar  CT image reconstructions of the cervical spine and maxillofacial structures were also generated. COMPARISON:  None. FINDINGS: CT HEAD FINDINGS Brain: No evidence of acute infarction, hemorrhage, hydrocephalus, extra-axial collection or mass lesion/mass effect. Vascular: No hyperdense vessel or unexpected calcification. Skull: Normal. Negative for fracture or focal lesion. Other: None. CT MAXILLOFACIAL FINDINGS Osseous: No fracture or mandibular dislocation. No destructive process. Orbits: Negative. No traumatic or inflammatory finding. Sinuses: Clear. Soft tissues: Large right infraorbital soft tissue  hematoma is noted. CT CERVICAL SPINE FINDINGS Alignment: Grade 1 anterolisthesis of C2-3 is noted secondary to posterior facet joint hypertrophy. Skull base and vertebrae: No acute fracture. No primary bone lesion or focal pathologic process. Soft tissues and spinal canal: No prevertebral fluid or swelling. No visible canal hematoma. Disc levels: Severe degenerative disc disease is noted at C3-4, C4-5 and C6-7. There appears to be fusion of the C5 and C6 vertebra which most likely is degenerative in etiology. Upper chest: Negative. Other: None. IMPRESSION: Normal head CT. Large right infraorbital soft tissue hematoma is noted. No definite osseous abnormality is noted in the maxillofacial region. Severe multilevel degenerative disc disease is noted in the cervical spine. No fracture or other acute abnormality is noted. Electronically Signed   By: Marijo Conception M.D.   On: 03/13/2019 14:59   Ct Cervical Spine Wo Contrast  Result Date: 03/13/2019 CLINICAL DATA:  Head laceration after fall. EXAM: CT HEAD WITHOUT CONTRAST CT MAXILLOFACIAL WITHOUT CONTRAST CT CERVICAL SPINE WITHOUT CONTRAST TECHNIQUE: Multidetector CT imaging of the head, cervical spine, and maxillofacial structures were performed using the standard protocol without intravenous contrast. Multiplanar CT image reconstructions of the cervical spine and maxillofacial structures were also generated. COMPARISON:  None. FINDINGS: CT HEAD FINDINGS Brain: No evidence of acute infarction, hemorrhage, hydrocephalus, extra-axial collection or mass lesion/mass effect. Vascular: No hyperdense vessel or unexpected calcification. Skull: Normal. Negative for fracture or focal lesion. Other: None. CT MAXILLOFACIAL FINDINGS Osseous: No fracture or mandibular dislocation. No destructive process. Orbits: Negative. No traumatic or inflammatory finding. Sinuses: Clear. Soft tissues: Large right infraorbital soft tissue hematoma is noted. CT CERVICAL SPINE FINDINGS  Alignment: Grade 1 anterolisthesis of C2-3 is noted secondary to posterior facet joint hypertrophy. Skull base and vertebrae: No acute fracture. No primary bone lesion or focal pathologic process. Soft tissues and spinal canal: No prevertebral fluid or swelling. No visible canal hematoma. Disc levels: Severe degenerative disc disease is noted at C3-4, C4-5 and C6-7. There appears to be fusion of the C5 and C6 vertebra which most likely is degenerative in etiology. Upper chest: Negative. Other: None. IMPRESSION: Normal head CT. Large right infraorbital soft tissue hematoma is noted. No definite osseous abnormality is noted in the maxillofacial region. Severe multilevel degenerative disc disease is noted in the cervical spine. No fracture or other acute abnormality is noted. Electronically Signed   By: Marijo Conception M.D.   On: 03/13/2019 14:59   Ct Abdomen Pelvis W Contrast  Result Date: 03/25/2019 CLINICAL DATA:  Acute generalized abdominal pain EXAM: CT ABDOMEN AND PELVIS WITH CONTRAST TECHNIQUE: Multidetector CT imaging of the abdomen and pelvis was performed using the standard protocol following bolus administration of intravenous contrast. CONTRAST:  119mL OMNIPAQUE IOHEXOL 300 MG/ML  SOLN COMPARISON:  10/25/2018 FINDINGS: Lower chest:  Minimal probable atelectasis at the right base. Hepatobiliary: Hepatic steatosis. No focal liver lesion.No evidence of biliary obstruction or stone. Pancreas: Unremarkable. Spleen: Unremarkable. Adrenals/Urinary Tract: Negative adrenals. No hydronephrosis or stone. Unremarkable bladder. Stomach/Bowel: No obstruction. No appendicitis. Clips  around the GE junction. Vascular/Lymphatic: No acute vascular abnormality. Atherosclerosis with moderate proximal SMA narrowing. No mass or adenopathy. Reproductive:Negative Other: No ascites or pneumoperitoneum. Right inguinal hernia repair using mesh. Musculoskeletal: No acute abnormalities. Remote bilateral posterior rib fractures.  Advanced L1-2 and L5-S1 disc degeneration. IMPRESSION: 1. No acute finding. 2. Hepatic steatosis. 3. Atherosclerosis. Electronically Signed   By: Monte Fantasia M.D.   On: 03/25/2019 05:42   Dg Swallowing Func-speech Pathology  Result Date: 03/18/2019 Objective Swallowing Evaluation: Type of Study: MBS-Modified Barium Swallow Study  Patient Details Name: Samuel Reyes MRN: [id number] Date of Birth: Jul 12, 1956 Today's Date: 03/18/2019 Time: SLP Start Time (ACUTE ONLY): [id number] -SLP Stop Time (ACUTE ONLY): 0930 SLP Time Calculation (min) (ACUTE ONLY): 25 min Past Medical History: Past Medical History: Diagnosis Date . Alcoholism ([place])  . Barrett's esophagus  . CAD (coronary artery disease) 2002  MI, no intervention required . COPD (chronic obstructive pulmonary disease) (HCC)   not on home O2 . Depression  . DTs (delirium tremens) ([place])  . GERD (gastroesophageal reflux disease)  . Headache  . Hypertension  . Stroke [facility]) 2006 Past Surgical History: Past Surgical History: Procedure Laterality Date . BACK SURGERY    3 cervical spine surgeries C4-C5 fused . COLONOSCOPY N/A 01/04/2014  Procedure: COLONOSCOPY;  Surgeon: Danie Binder, MD;  Location: [facility] ENDO SUITE;  Service: Endoscopy;  Laterality: N/A;  1:45 . ESOPHAGOGASTRODUODENOSCOPY N/A 01/04/2014  Procedure: ESOPHAGOGASTRODUODENOSCOPY (EGD);  Surgeon: Danie Binder, MD;  Location: [facility] END[facility] SUITE;  Service: Endoscopy;  Laterality: N/A; . FINGER SURGERY Left   2nd, 3rd, & 4th fingers were cut off by table saw and reattached . GASTRECTOMY   . HERNIA REPAIR   . INCISIONAL HERNIA REPAIR N/A 01/20/2014  Procedure: LAPAROSCOPIC RECURRENT  INCISIONAL HERNIA with mesh;  Surgeon: Edward Jolly, MD;  Location: [facility] ORS;  Service: General;  Laterality: N/A; . rt knee arthroscopic surgery   . SHOULDER SURGERY Bilateral   3 surgeries on on left, 2 surgeries on right  HPI: 63 year old male with history of CAD, COPD not on oxygen, unspecified type of CVA, hypertension, hypertension,  MDD with suicidal attempt and previous psych hospitalization, GERD, upper GI bleed, tobacco use disorder and alcohol use disorder presented with chest and epigastric pain with nausea and fall after alcohol intoxication.  He was admitted to ICU for severe hyponatremia with sodium of 108.  He was treated with IV fluids.  After hyponatremia improved, he was transferred to [facility] care on 03/15/2019. Clinical swallow eval January of 2018 with recs for regular diet, thin liquids, and GI f/u given long-term issues with reflux.  Barrett's esophagus dx'd in 2015.  Presence of endplate osteophytes at multiple levels. Severe degenerative disc disease C3-4, C4-5 and C6-7.  Fusion of C5 and 6 vertebra, degenerative in etiology per CT cervical spine this admission.  Subjective: alert, communicative Assessment / Plan / Recommendation CHL IP CLINICAL IMPRESSIONS 03/18/2019 Clinical Impression Pt presents with quite normal oropharyngeal swallow, presenting with no dysphagia, no coughing during study.  There was adequate oral mastication and oral control, good timing of swallow onset, excellent pharyngeal clearance with no residue-post swallow, and no penetration nor aspiration.  Brief scan of esophagus revealed barium retention in distal esophagus.  Reviewed video with pt in real time - recommend continuing regular solids, thin liquids.  Discussed basic precautions with pt to improve comfort and decrease coughing episodes.  Pt agrees.  No f/u needed. SLP to sign off.  SLP Visit Diagnosis Dysphagia, unspecified (  R13.10) Attention and concentration deficit following -- Frontal lobe and executive function deficit following -- Impact on safety and function No limitations   CHL IP TREATMENT RECOMMENDATION 03/18/2019 Treatment Recommendations No treatment recommended at this time   No flowsheet data found. CHL IP DIET RECOMMENDATION 03/18/2019 SLP Diet Recommendations Regular solids;Thin liquid Liquid Administration via Cup;Straw Medication  Administration Whole meds with liquid Compensations -- Postural Changes Seated upright at 90 degrees   CHL IP OTHER RECOMMENDATIONS 03/18/2019 Recommended Consults -- Oral Care Recommendations Oral care BID Other Recommendations --   CHL IP FOLLOW UP RECOMMENDATIONS 03/18/2019 Follow up Recommendations None   CHL IP FREQUENCY AND DURATION 03/17/2019 Speech Therapy Frequency (ACUTE ONLY) min 2x/week Treatment Duration 1 week      CHL IP ORAL PHASE 03/18/2019 Oral Phase WFL Oral - Pudding Teaspoon -- Oral - Pudding Cup -- Oral - Honey Teaspoon -- Oral - Honey Cup -- Oral - Nectar Teaspoon -- Oral - Nectar Cup -- Oral - Nectar Straw -- Oral - Thin Teaspoon -- Oral - Thin Cup -- Oral - Thin Straw -- Oral - Puree -- Oral - Mech Soft -- Oral - Regular -- Oral - Multi-Consistency -- Oral - Pill -- Oral Phase - Comment --  CHL IP PHARYNGEAL PHASE 03/18/2019 Pharyngeal Phase WFL Pharyngeal- Pudding Teaspoon -- Pharyngeal -- Pharyngeal- Pudding Cup -- Pharyngeal -- Pharyngeal- Honey Teaspoon -- Pharyngeal -- Pharyngeal- Honey Cup -- Pharyngeal -- Pharyngeal- Nectar Teaspoon -- Pharyngeal -- Pharyngeal- Nectar Cup -- Pharyngeal -- Pharyngeal- Nectar Straw -- Pharyngeal -- Pharyngeal- Thin Teaspoon -- Pharyngeal -- Pharyngeal- Thin Cup -- Pharyngeal -- Pharyngeal- Thin Straw -- Pharyngeal -- Pharyngeal- Puree -- Pharyngeal -- Pharyngeal- Mechanical Soft -- Pharyngeal -- Pharyngeal- Regular -- Pharyngeal -- Pharyngeal- Multi-consistency -- Pharyngeal -- Pharyngeal- Pill -- Pharyngeal -- Pharyngeal Comment --  CHL IP CERVICAL ESOPHAGEAL PHASE 03/18/2019 Cervical Esophageal Phase WFL Pudding Teaspoon -- Pudding Cup -- Honey Teaspoon -- Honey Cup -- Nectar Teaspoon -- Nectar Cup -- Nectar Straw -- Thin Teaspoon -- Thin Cup -- Thin Straw -- Puree -- Mechanical Soft -- Regular -- Multi-consistency -- Pill -- Cervical Esophageal Comment -- [name] 03/18/2019, 9:49 AM              Ct Maxillofacial Wo Contrast  Result Date:  03/13/2019 CLINICAL DATA:  Head laceration after fall. EXAM: CT HEAD WITHOUT CONTRAST CT MAXILLOFACIAL WITHOUT CONTRAST CT CERVICAL SPINE WITHOUT CONTRAST TECHNIQUE: Multidetector CT imaging of the head, cervical spine, and maxillofacial structures were performed using the standard protocol without intravenous contrast. Multiplanar CT image reconstructions of the cervical spine and maxillofacial structures were also generated. COMPARISON:  None. FINDINGS: CT HEAD FINDINGS Brain: No evidence of acute infarction, hemorrhage, hydrocephalus, extra-axial collection or mass lesion/mass effect. Vascular: No hyperdense vessel or unexpected calcification. Skull: Normal. Negative for fracture or focal lesion. Other: None. CT MAXILLOFACIAL FINDINGS Osseous: No fracture or mandibular dislocation. No destructive process. Orbits: Negative. No traumatic or inflammatory finding. Sinuses: Clear. Soft tissues: Large right infraorbital soft tissue hematoma is noted. CT CERVICAL SPINE FINDINGS Alignment: Grade 1 anterolisthesis of C2-3 is noted secondary to posterior facet joint hypertrophy. Skull base and vertebrae: No acute fracture. No primary bone lesion or focal pathologic process. Soft tissues and spinal canal: No prevertebral fluid or swelling. No visible canal hematoma. Disc levels: Severe degenerative disc disease is noted at C3-4, C4-5 and C6-7. There appears to be fusion of the C5 and C6 vertebra which most likely is degenerative in etiology. Upper chest:  Negative. Other: None. IMPRESSION: Normal head CT. Large right infraorbital soft tissue hematoma is noted. No definite osseous abnormality is noted in the maxillofacial region. Severe multilevel degenerative disc disease is noted in the cervical spine. No fracture or other acute abnormality is noted. Electronically Signed   By: Marijo Conception M.D.   On: 03/13/2019 14:59       Subjective:  Patient seen and examined the bedside this morning.  Hemodynamically stable  for discharge to inpatient psych as soon as the bed is available  Discharge Exam: Vitals:   03/29/19 0736 03/29/19 1147  BP: (!) 147/86 120/79  Pulse: 88 86  Resp: 14 16  Temp: 98.8 F (37.1 C) 99.1 F (37.3 C)  SpO2: 97% 99%   Vitals:   03/28/19 2355 03/29/19 0350 03/29/19 0736 03/29/19 1147  BP: (!) 141/76 140/79 (!) 147/86 120/79  Pulse: 78 93 88 86  Resp: 18 18 14 16   Temp: 98.8 F (37.1 C) 98.6 F (37 C) 98.8 F (37.1 C) 99.1 F (37.3 C)  TempSrc: Oral Oral Oral Oral  SpO2: 96% 96% 97% 99%  Weight:      Height:        General: Pt is alert, awake, not in acute distress Cardiovascular: RRR, S1/S2 +, no rubs, no gallops Respiratory: CTA bilaterally, no wheezing, no rhonchi Abdominal: Soft, NT, ND, bowel sounds + Extremities: no edema, no cyanosis,healing ulcerations    The results of significant diagnostics from this hospitalization (including imaging, microbiology, ancillary and laboratory) are listed below for reference.     Microbiology: Recent Results (from the past 240 hour(s))  SARS CORONAVIRUS 2 (TAT 6-12 HRS) Nasal Swab Aptima Multi Swab     Status: None   Collection Time: 03/25/19  8:21 AM   Specimen: Aptima Multi Swab; Nasal Swab  Result Value Ref Range Status   SARS Coronavirus 2 NEGATIVE NEGATIVE Final    Comment: (NOTE) SARS-CoV-2 target nucleic acids are NOT DETECTED. The SARS-CoV-2 RNA is generally detectable in upper and lower respiratory specimens during the acute phase of infection. Negative results do not preclude SARS-CoV-2 infection, do not rule out co-infections with other pathogens, and should not be used as the sole basis for treatment or other patient management decisions. Negative results must be combined with clinical observations, patient history, and epidemiological information. The expected result is Negative. Fact Sheet for Patients: SugarRoll.be Fact Sheet for Healthcare  Providers: https://www.woods-mathews.com/ This test is not yet approved or cleared by the [place] FDA and  has been authorized for detection and/or diagnosis of SARS-CoV-2 by FDA under an Emergency Use Authorization (EUA). This EUA will remain  in effect (meaning this test can be used) for the duration of the COVID-19 declaration under Section 56 4(b)(1) of the Act, 21 U.S.C. section 360bbb-3(b)(1), unless the authorization is terminated or revoked sooner. Performed at [facility], [place] [address], [place], [place] [address]      Labs: BNP (last 3 results) No results for input(s): BNP in the last 8760 hours. Basic Metabolic Panel: Recent Labs  Lab 03/25/19 0314 03/25/19 0831 03/26/19 0303  NA 134*  --  136  K 4.4  --  3.8  CL 102  --  102  CO2 20*  --  27  GLUCOSE 90  --  80  BUN 9  --  12  CREATININE 0.82  --  0.72  CALCIUM 8.3*  --  8.2*  MG  --  1.9  --  Liver Function Tests: Recent Labs  Lab 03/25/19 0314  AST 28  ALT 31  ALKPHOS 90  BILITOT 0.8  PROT 6.4*  ALBUMIN 3.3*   Recent Labs  Lab 03/25/19 0314  LIPASE 21   No results for input(s): AMMONIA in the last 168 hours. CBC: Recent Labs  Lab 03/25/19 0314 03/26/19 0303  WBC 12.4* 7.7  NEUTROABS 9.5*  --   HGB 12.1* 10.2*  HCT 37.5* 31.8*  MCV 86.6 87.4  PLT 532* 444*   Cardiac Enzymes: No results for input(s): CKTOTAL, CKMB, CKMBINDEX, TROPONINI in the last 168 hours. BNP: Invalid input(s): POCBNP CBG: No results for input(s): GLUCAP in the last 168 hours. D-Dimer No results for input(s): DDIMER in the last 72 hours. Hgb A1c No results for input(s): HGBA1C in the last 72 hours. Lipid Profile No results for input(s): CHOL, HDL, LDLCALC, TRIG, CHOLHDL, LDLDIRECT in the last 72 hours. Thyroid function studies No results for input(s): TSH, T4TOTAL, T3FREE, THYROIDAB in the last 72 hours.  Invalid input(s): FREET3 Anemia work up No results for input(s):  VITAMINB12, FOLATE, FERRITIN, TIBC, IRON, RETICCTPCT in the last 72 hours. Urinalysis    Component Value Date/Time   COLORURINE YELLOW 03/27/2019 0213   APPEARANCEUR CLEAR 03/27/2019 0213   LABSPEC 1.013 03/27/2019 0213   PHURINE 6.0 03/27/2019 0213   GLUCOSEU NEGATIVE 03/27/2019 0213   HGBUR NEGATIVE 03/27/2019 0213   BILIRUBINUR NEGATIVE 03/27/2019 0213   KETONESUR NEGATIVE 03/27/2019 0213   PROTEINUR NEGATIVE 03/27/2019 0213   UROBILINOGEN 0.2 05/31/2015 2156   NITRITE NEGATIVE 03/27/2019 0213   LEUKOCYTESUR NEGATIVE 03/27/2019 0213   Sepsis Labs Invalid input(s): PROCALCITONIN,  WBC,  LACTICIDVEN Microbiology Recent Results (from the past 240 hour(s))  SARS CORONAVIRUS 2 (TAT 6-12 HRS) Nasal Swab Aptima Multi Swab     Status: None   Collection Time: 03/25/19  8:21 AM   Specimen: Aptima Multi Swab; Nasal Swab  Result Value Ref Range Status   SARS Coronavirus 2 NEGATIVE NEGATIVE Final    Comment: (NOTE) SARS-CoV-2 target nucleic acids are NOT DETECTED. The SARS-CoV-2 RNA is generally detectable in upper and lower respiratory specimens during the acute phase of infection. Negative results do not preclude SARS-CoV-2 infection, do not rule out co-infections with other pathogens, and should not be used as the sole basis for treatment or other patient management decisions. Negative results must be combined with clinical observations, patient history, and epidemiological information. The expected result is Negative. Fact Sheet for Patients: SugarRoll.be Fact Sheet for Healthcare Providers: https://www.woods-mathews.com/ This test is not yet approved or cleared by the [place] FDA and  has been authorized for detection and/or diagnosis of SARS-CoV-2 by FDA under an Emergency Use Authorization (EUA). This EUA will remain  in effect (meaning this test can be used) for the duration of the COVID-19 declaration under Section 56 4(b)(1)  of the Act, 21 U.S.C. section 360bbb-3(b)(1), unless the authorization is terminated or revoked sooner. Performed at [facility], [place] [address], [place], [place] [address]     Please note: You were cared for by a hospitalist during your hospital stay. Once you are discharged, your primary care physician will handle any further medical issues. Please note that NO REFILLS for any discharge medications will be authorized once you are discharged, as it is imperative that you return to your primary care physician (or establish a relationship with a primary care physician if you do not have one) for your post hospital discharge needs so that they  can reassess your need for medications and monitor your lab values.    Time coordinating discharge: 40 minutes  SIGNED:   Shelly Coss, MD  [facility] 03/29/2019, 12:37 PM Pager [phone]  If 7PM-7AM, please contact night-coverage [email or web address] Password [email or web address]

## 2019-03-29 NOTE — TOC Progression Note (Addendum)
Transition of Care [facility]) - Progression Note    Patient Details  Name: Samuel Reyes MRN: [id number] Date of Birth: Nov 16, 1955  Transition of Care [facility]) CM/SW [place], [place] Phone Number: [phone] 03/29/2019, 2:51 PM  Clinical Narrative:    CSW called [facility] again and was notified they still did not have a bed available . CSW  was informed that they should have some discharges on Monday.  CSW checked hub and no bed offers are back.  A CSW will follow up to ascertain bed availability.  Expected Discharge Plan: Psychiatric Hospital Barriers to Discharge: Continued Medical Work up  Expected Discharge Plan and Services Expected Discharge Plan: Psychiatric Hospital In-house Referral: Clinical Social Work   Post Acute Care Choice: NA(psychiatry) Living arrangements for the past 2 months: Homeless Expected Discharge Date: 03/29/19                                     Social Determinants of Health (SDOH) Interventions    Readmission Risk Interventions No flowsheet data found.

## 2019-03-30 NOTE — Progress Notes (Signed)
PROGRESS NOTE    Samuel Reyes  [id number] DOB: August 18, 1955 DOA: 03/24/2019 PCP: [name], NP   Brief Narrative:  Patient is a 63 year old male with history of chronic alcohol abuse, hypertension, COPD, coronary artery disease, CVA, homelessness, tobacco abuse who presents with epigastric abdominal pain, nausea, vomiting.  He also developed blisters, ulcerations, desquamation on his whole body being outside in the sun.  Patient is homeless.  He was just discharged from here 6 days ago after being treated for severe hyponatremia requiring admission initially to ICU.  Patient was found to be in alcohol withdrawal with CIWA score of 15 on presentation.  He was admitted for alcohol withdrawal management.  Patient also appeared to be severely depressed and suicidal ideations.  Sitter ordered , psych consulted.  After psych evaluation, inpatient psych admission has been recommended. Patient is medically cleared for inpatient psych admission as soon as bed is available.   Assessment & Plan:   Principal Problem:   Alcohol use with alcohol-induced mood disorder (HCC) Active Problems:   COPD (chronic obstructive pulmonary disease) (HCC)   Depression   Hyponatremia   Nausea and vomiting   Tobacco abuse   Homelessness   Alcohol withdrawal (HCC)   Pressure injury of skin   Leukocytosis   Thrombocytosis (HCC)   Sunburn   Epigastric abdominal pain   Acute alcohol withdrawal: Presented with tremulousness, tachycardia.  He reports drinking 3-4 cases of 12 ounce beer.  Started on CIWA protocol.  This morning he was calm and cooperative.  Continue thiamine and folic acid.Started on gabapentin for chronic alcoholism.  Depression: Appears depressed.  He admitted of having suicidal ideation.  Psych recommended inpatient psych admission.  Continue Engineer, materials.  Continue Zoloft and trazodone  Epigastric abdominal pain/nausea, vomiting: Could be secondary to gastritis secondary to  alcohol.  Denies any abdominal pain, nausea or vomiting this morning.   Sunburn: Presented with significant sunburn on the face, legs.  Wound care nurse consulted.  Continue mupirocin ointment.  Ulcerations on the bilateral lower extremity noted with scant bleeding.Started on keflex.  Normocytic anemia: Stable  Hyponatremia: Mild.  Continue to monitor  COPD: Not in acute exacerbation at present.  Continue bronchodilators as needed  Abdominal pain/GERD: Continue Protonix  Homelessness: Education officer, museum consulted and following.         DVT prophylaxis: Lovenox Code Status: Full Family Communication: None present at the bedside Disposition Plan: Inpatient as soon as bed is available.  Consultants: None  Procedures:None  Antimicrobials:  Anti-infectives (From admission, onward)   Start     Dose/Rate Route Frequency Ordered Stop   03/30/19 0000  cephALEXin (KEFLEX) 500 MG capsule     500 mg Oral Every 8 hours 03/29/19 1236 04/02/19 2359   03/28/19 1400  cephALEXin (KEFLEX) capsule 500 mg     500 mg Oral Every 8 hours 03/28/19 1313 04/02/19 1359      Subjective:  Patient seen and examined the bedside this morning.  Hemodynamically stable.  Looked comfortable.  Complains of some epigastric abdominal pain with GERD.  He is already on Protonix.  His bilateral lower extremity ulcerations look to be healing very well.  Objective: Vitals:   03/29/19 1800 03/29/19 1825 03/29/19 2033 03/30/19 0630  BP: 138/90 (!) 138/93 127/79 135/75  Pulse: 80 77 85 93  Resp:  19 16 16   Temp:  98.3 F (36.8 C) 98.8 F (37.1 C) 97.8 F (36.6 C)  TempSrc:  Oral Oral Oral  SpO2:  99% 95% 97%  Weight:    68 kg  Height:        Intake/Output Summary (Last 24 hours) at 03/30/2019 1325 Last data filed at 03/30/2019 1300 Gross per 24 hour  Intake 1443 ml  Output 2050 ml  Net -607 ml   Filed Weights   03/25/19 0843 03/26/19 0456 03/30/19 0630  Weight: 66.5 kg 68.4 kg 68 kg    Examination:   General exam: Not in distress,average built HEENT:PERRL,Oral mucosa moist, Ear/Nose normal on gross exam Respiratory system: Bilateral equal air entry, normal vesicular breath sounds, no wheezes or crackles  Cardiovascular system: S1 & S2 heard, RRR. No JVD, murmurs, rubs, gallops or clicks. Gastrointestinal system: Abdomen is nondistended, soft and nontender. No organomegaly or masses felt. Normal bowel sounds heard. Central nervous system: Alert and oriented. No focal neurological deficits. Extremities: No edema, no clubbing ,no cyanosis, distal peripheral pulses palpable. Skin: Multiple desquamations, blisters, sunburn Ulcers on the bilateral lower extremities Psychiatry: Depressed   Data Reviewed: I have personally reviewed following labs and imaging studies  CBC: Recent Labs  Lab 03/25/19 0314 03/26/19 0303  WBC 12.4* 7.7  NEUTROABS 9.5*  --   HGB 12.1* 10.2*  HCT 37.5* 31.8*  MCV 86.6 87.4  PLT 532* [id number]*   Basic Metabolic Panel: Recent Labs  Lab 03/25/19 0314 03/25/19 0831 03/26/19 0303  NA 134*  --  136  K 4.4  --  3.8  CL 102  --  102  CO2 20*  --  27  GLUCOSE 90  --  80  BUN 9  --  12  CREATININE 0.82  --  0.72  CALCIUM 8.3*  --  8.2*  MG  --  1.9  --    GFR: Estimated Creatinine Clearance: 79.1 mL/min (by C-G formula based on SCr of 0.72 mg/dL). Liver Function Tests: Recent Labs  Lab 03/25/19 0314  AST 28  ALT 31  ALKPHOS 90  BILITOT 0.8  PROT 6.4*  ALBUMIN 3.3*   Recent Labs  Lab 03/25/19 0314  LIPASE 21   No results for input(s): AMMONIA in the last 168 hours. Coagulation Profile: No results for input(s): INR, PROTIME in the last 168 hours. Cardiac Enzymes: No results for input(s): CKTOTAL, CKMB, CKMBINDEX, TROPONINI in the last 168 hours. BNP (last 3 results) No results for input(s): PROBNP in the last 8760 hours. HbA1C: No results for input(s): HGBA1C in the last 72 hours. CBG: No results for input(s): GLUCAP in the last 168 hours.  Lipid Profile: No results for input(s): CHOL, HDL, LDLCALC, TRIG, CHOLHDL, LDLDIRECT in the last 72 hours. Thyroid Function Tests: No results for input(s): TSH, T4TOTAL, FREET4, T3FREE, THYROIDAB in the last 72 hours. Anemia Panel: No results for input(s): VITAMINB12, FOLATE, FERRITIN, TIBC, IRON, RETICCTPCT in the last 72 hours. Sepsis Labs: No results for input(s): PROCALCITON, LATICACIDVEN in the last 168 hours.  Recent Results (from the past 240 hour(s))  SARS CORONAVIRUS 2 (TAT 6-12 HRS) Nasal Swab Aptima Multi Swab     Status: None   Collection Time: 03/25/19  8:21 AM   Specimen: Aptima Multi Swab; Nasal Swab  Result Value Ref Range Status   SARS Coronavirus 2 NEGATIVE NEGATIVE Final    Comment: (NOTE) SARS-CoV-2 target nucleic acids are NOT DETECTED. The SARS-CoV-2 RNA is generally detectable in upper and lower respiratory specimens during the acute phase of infection. Negative results do not preclude SARS-CoV-2 infection, do not rule out co-infections with other pathogens, and should not be used as the sole basis  for treatment or other patient management decisions. Negative results must be combined with clinical observations, patient history, and epidemiological information. The expected result is Negative. Fact Sheet for Patients: SugarRoll.be Fact Sheet for Healthcare Providers: https://www.woods-mathews.com/ This test is not yet approved or cleared by the [place] FDA and  has been authorized for detection and/or diagnosis of SARS-CoV-2 by FDA under an Emergency Use Authorization (EUA). This EUA will remain  in effect (meaning this test can be used) for the duration of the COVID-19 declaration under Section 56 4(b)(1) of the Act, 21 U.S.C. section 360bbb-3(b)(1), unless the authorization is terminated or revoked sooner. Performed at [facility], [place] [address] [place], [place] [address]          Radiology  Studies: No results found.      Scheduled Meds: . cephALEXin  500 mg Oral Q8H  . enoxaparin (LOVENOX) injection  40 mg Subcutaneous Q24H  . folic acid  1 mg Oral Daily  . gabapentin  300 mg Oral TID  . multivitamin with minerals  1 tablet Oral Daily  . mupirocin ointment   Topical BID  . nicotine  14 mg Transdermal Daily  . pantoprazole  40 mg Oral BID  . sertraline  50 mg Oral Daily  . sodium chloride flush  3 mL Intravenous Q12H  . thiamine  100 mg Oral Daily   Or  . thiamine  100 mg Intravenous Daily  . traZODone  100 mg Oral QHS,MR X 1   Continuous Infusions:    LOS: 4 days    Time spent: 35 mins.More than 50% of that time was spent in counseling and/or coordination of care.      Shelly Coss, MD [facility] Pager [phone]  If 7PM-7AM, please contact night-coverage [email or web address] Password [email or web address] 03/30/2019, 1:25 PM

## 2019-03-31 ENCOUNTER — Other Ambulatory Visit: Payer: Self-pay | Admitting: Family

## 2019-03-31 ENCOUNTER — Inpatient Hospital Stay (HOSPITAL_COMMUNITY)
Admission: AD | Admit: 2019-03-31 | Discharge: 2019-04-06 | DRG: 885 | Disposition: A | Discharge status: Home / Self Care | Payer: Medicaid Other | Source: Intra-hospital | Attending: Psychiatry | Admitting: Psychiatry

## 2019-03-31 ENCOUNTER — Other Ambulatory Visit: Payer: Self-pay

## 2019-03-31 ENCOUNTER — Encounter (HOSPITAL_COMMUNITY): Payer: Self-pay

## 2019-03-31 DIAGNOSIS — G4700 Insomnia, unspecified: Secondary | ICD-10-CM | POA: Diagnosis present

## 2019-03-31 DIAGNOSIS — F332 Major depressive disorder, recurrent severe without psychotic features: Principal | ICD-10-CM | POA: Diagnosis present

## 2019-03-31 DIAGNOSIS — F419 Anxiety disorder, unspecified: Secondary | ICD-10-CM | POA: Diagnosis present

## 2019-03-31 DIAGNOSIS — Z590 Homelessness: Secondary | ICD-10-CM | POA: Diagnosis not present

## 2019-03-31 DIAGNOSIS — Z880 Allergy status to penicillin: Secondary | ICD-10-CM

## 2019-03-31 DIAGNOSIS — F1024 Alcohol dependence with alcohol-induced mood disorder: Secondary | ICD-10-CM | POA: Diagnosis not present

## 2019-03-31 DIAGNOSIS — R45851 Suicidal ideations: Secondary | ICD-10-CM | POA: Diagnosis present

## 2019-03-31 DIAGNOSIS — J449 Chronic obstructive pulmonary disease, unspecified: Secondary | ICD-10-CM | POA: Diagnosis present

## 2019-03-31 DIAGNOSIS — F17210 Nicotine dependence, cigarettes, uncomplicated: Secondary | ICD-10-CM | POA: Diagnosis present

## 2019-03-31 DIAGNOSIS — F1020 Alcohol dependence, uncomplicated: Secondary | ICD-10-CM | POA: Diagnosis present

## 2019-03-31 MED ORDER — TRAZODONE HCL 100 MG PO TABS
100.0000 mg | ORAL_TABLET | Freq: Every evening | ORAL | Status: DC | PRN
  Administered 2019-03-31 – 2019-04-05 (×6): 100 mg via ORAL
  Filled 2019-03-31 (×6): qty 1

## 2019-03-31 MED ORDER — HYDROXYZINE HCL 25 MG PO TABS: 25.0000 mg | ORAL_TABLET | Freq: Three times a day (TID) | ORAL | Status: DC | PRN

## 2019-03-31 MED ORDER — PANTOPRAZOLE SODIUM 40 MG PO TBEC
40.0000 mg | DELAYED_RELEASE_TABLET | Freq: Two times a day (BID) | ORAL | Status: DC
  Administered 2019-04-01 – 2019-04-06 (×11): 40 mg via ORAL
  Filled 2019-03-31 (×20): qty 1

## 2019-03-31 MED ORDER — ALBUTEROL SULFATE HFA 108 (90 BASE) MCG/ACT IN AERS: 2.0000 | INHALATION_SPRAY | Freq: Four times a day (QID) | RESPIRATORY_TRACT | Status: DC | PRN

## 2019-03-31 MED ORDER — ALUM & MAG HYDROXIDE-SIMETH 200-200-20 MG/5ML PO SUSP: 30.0000 mL | ORAL | Status: DC | PRN

## 2019-03-31 MED ORDER — MAGNESIUM HYDROXIDE 400 MG/5ML PO SUSP: 30.0000 mL | Freq: Every day | ORAL | Status: DC | PRN

## 2019-03-31 MED ORDER — GABAPENTIN 300 MG PO CAPS
300.0000 mg | ORAL_CAPSULE | Freq: Three times a day (TID) | ORAL | Status: DC
  Administered 2019-03-31 – 2019-04-06 (×17): 300 mg via ORAL
  Filled 2019-03-31 (×26): qty 1

## 2019-03-31 MED ORDER — THIAMINE HCL 100 MG/ML IJ SOLN: 100.0000 mg | Freq: Once | INTRAMUSCULAR | Status: DC

## 2019-03-31 MED ORDER — ACETAMINOPHEN 325 MG PO TABS
650.0000 mg | ORAL_TABLET | Freq: Four times a day (QID) | ORAL | Status: DC | PRN
  Administered 2019-03-31 – 2019-04-05 (×7): 650 mg via ORAL
  Filled 2019-03-31 (×7): qty 2

## 2019-03-31 MED ORDER — LORAZEPAM 1 MG PO TABS: 1.0000 mg | ORAL_TABLET | Freq: Four times a day (QID) | ORAL | Status: DC | PRN

## 2019-03-31 MED ORDER — VITAMIN B-1 100 MG PO TABS
100.0000 mg | ORAL_TABLET | Freq: Every day | ORAL | Status: DC
  Administered 2019-04-01 – 2019-04-06 (×6): 100 mg via ORAL
  Filled 2019-03-31 (×9): qty 1

## 2019-03-31 MED ORDER — ADULT MULTIVITAMIN W/MINERALS CH
1.0000 | ORAL_TABLET | Freq: Every day | ORAL | Status: DC
  Administered 2019-04-01 – 2019-04-06 (×6): 1 via ORAL
  Filled 2019-03-31 (×11): qty 1

## 2019-03-31 MED ORDER — CEPHALEXIN 500 MG PO CAPS
500.0000 mg | ORAL_CAPSULE | Freq: Three times a day (TID) | ORAL | Status: AC
  Administered 2019-04-01 – 2019-04-02 (×5): 500 mg via ORAL
  Filled 2019-03-31: qty 1
  Filled 2019-03-31: qty 2
  Filled 2019-03-31 (×2): qty 1
  Filled 2019-03-31: qty 2
  Filled 2019-03-31 (×3): qty 1

## 2019-03-31 MED ORDER — SERTRALINE HCL 50 MG PO TABS
50.0000 mg | ORAL_TABLET | Freq: Every day | ORAL | Status: DC
  Administered 2019-04-01 – 2019-04-06 (×6): 50 mg via ORAL
  Filled 2019-03-31 (×9): qty 1
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
## 2019-03-31 NOTE — [facility] Suicide Risk Assessment (Signed)
[facility] Admission Suicide Risk Assessment   Nursing information obtained from:  Patient Demographic factors:  Male, Unemployed, Low socioeconomic status, Divorced or widowed Current Mental Status:  Self-harm thoughts Loss Factors:  Loss of significant relationship, Decline in physical health Historical Factors:  Anniversary of important loss Risk Reduction Factors:  NA  Total Time spent with patient: 45 minutes Principal Problem: Alcohol Dependence, Alcohol Induced Mood Disorder versus MDD Diagnosis:  Active Problems:   MDD (major depressive disorder), recurrent episode, severe (HCC)  Subjective Data:   Continued Clinical Symptoms:  Alcohol Use Disorder Identification Test Final Score (AUDIT): 12 The "Alcohol Use Disorders Identification Test", Guidelines for Use in Primary Care, Second Edition.  World Pharmacologist [facility]). Score between 0-7:  no or low risk or alcohol related problems. Score between 8-15:  moderate risk of alcohol related problems. Score between 16-19:  high risk of alcohol related problems. Score 20 or above:  warrants further diagnostic evaluation for alcohol dependence and treatment.   CLINICAL FACTORS:  36 y old male, currently homeless. Presented for nausea, abdominal pain, hyponatremia, severe sunburn on lower extremities. History of alcohol dependence, reports was drinking up to a case of beer up to admission on 8/25. Initially admitted to medical unit , transferred to [facility] on medical clearance due to depression and reported SI.    Psychiatric Specialty Exam: Physical Exam  ROS  Blood pressure 126/80, pulse 93, temperature 98.4 F (36.9 C), temperature source Oral, resp. rate 18, height 5\' 4"  (1.626 m), weight 67.6 kg, SpO2 99 %.Body mass index is 25.58 kg/m.  See admit note MSE   COGNITIVE FEATURES THAT CONTRIBUTE TO RISK:  Closed-mindedness and Loss of executive function    SUICIDE RISK:   Moderate:  Frequent suicidal ideation with limited  intensity, and duration, some specificity in terms of plans, no associated intent, good self-control, limited dysphoria/symptomatology, some risk factors present, and identifiable protective factors, including available and accessible social support.  PLAN OF CARE: Patient will be admitted to inpatient psychiatric unit for stabilization and safety. Will provide and encourage milieu participation. Provide medication management and maked adjustments as needed. Will also provide medication management to address potential alcohol WDL as needed . Will follow daily.    I certify that inpatient services furnished can reasonably be expected to improve the patient's condition.   Jenne Campus, MD 03/31/2019, 6:59 PM
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
## 2019-03-31 NOTE — Progress Notes (Signed)
The patient attended group but had nothing to share.

## 2019-03-31 NOTE — Tx Team (Signed)
Initial Treatment Plan 03/31/2019 5:11 PM [name] [id number]    PATIENT STRESSORS: Financial difficulties Health problems Substance abuse   PATIENT STRENGTHS: Ability for insight Communication skills   PATIENT IDENTIFIED PROBLEMS: Suicidal Ideation  Depression  Substance Abuse                 DISCHARGE CRITERIA:  Ability to meet basic life and health needs Safe-care adequate arrangements made  PRELIMINARY DISCHARGE PLAN: Attend aftercare/continuing care group Outpatient therapy  PATIENT/FAMILY INVOLVEMENT: This treatment plan has been presented to and reviewed with the patient, Samuel Reyes, and/or family member.  The patient and family have been given the opportunity to ask questions and make suggestions.  Vela Prose, RN 03/31/2019, 5:11 PM

## 2019-03-31 NOTE — Progress Notes (Signed)
PROGRESS NOTE    Samuel Reyes  [id number] DOB: 1955/11/10 DOA: 03/24/2019 PCP: [name], NP   Brief Narrative:  Patient is a 63 year old male with history of chronic alcohol abuse, hypertension, COPD, coronary artery disease, CVA, homelessness, tobacco abuse who presents with epigastric abdominal pain, nausea, vomiting.  He also developed blisters, ulcerations, desquamation on his whole body being outside in the sun.  Patient is homeless.  He was just discharged from here 6 days ago after being treated for severe hyponatremia requiring admission initially to ICU.  Patient was found to be in alcohol withdrawal with CIWA score of 15 on presentation.  He was admitted for alcohol withdrawal management.  Patient also appeared to be severely depressed and suicidal ideations.  Sitter ordered , psych consulted.  After psych evaluation, inpatient psych admission has been recommended. Patient is medically cleared for inpatient psych admission as soon as bed is available.   Assessment & Plan:   Principal Problem:   Alcohol use with alcohol-induced mood disorder (HCC) Active Problems:   COPD (chronic obstructive pulmonary disease) (HCC)   Depression   Hyponatremia   Nausea and vomiting   Tobacco abuse   Homelessness   Alcohol withdrawal (HCC)   Pressure injury of skin   Leukocytosis   Thrombocytosis (HCC)   Sunburn   Epigastric abdominal pain   Acute alcohol withdrawal: Presented with tremulousness, tachycardia.  He reports drinking 3-4 cases of 12 ounce beer.  Started on CIWA protocol.  This morning he was calm and cooperative.  Continue thiamine and folic acid.Started on gabapentin for chronic alcoholism.  Depression: Appears depressed.  He admitted of having suicidal ideation.  Psych recommended inpatient psych admission.  Continue Engineer, materials.  Continue Zoloft and trazodone  Epigastric abdominal pain/nausea, vomiting: Could be secondary to gastritis secondary to  alcohol.  Denies any abdominal pain, nausea or vomiting this morning.   Sunburn: Presented with significant sunburn on the face, legs.  Wound care nurse consulted.  Continue mupirocin ointment.  Ulcerations on the bilateral lower extremity noted with scant bleeding.Started on keflex.  Normocytic anemia: Stable  Hyponatremia: Mild.  Continue to monitor  COPD: Not in acute exacerbation at present.  Continue bronchodilators as needed  Abdominal pain/GERD: Continue Protonix  Homelessness: Education officer, museum consulted and following.         DVT prophylaxis: Lovenox Code Status: Full Family Communication: None present at the bedside Disposition Plan: Inpatient as soon as bed is available.  Consultants: None  Procedures:None  Antimicrobials:  Anti-infectives (From admission, onward)   Start     Dose/Rate Route Frequency Ordered Stop   03/30/19 0000  cephALEXin (KEFLEX) 500 MG capsule     500 mg Oral Every 8 hours 03/29/19 1236 04/02/19 2359   03/28/19 1400  cephALEXin (KEFLEX) capsule 500 mg     500 mg Oral Every 8 hours 03/28/19 1313 04/02/19 1359      Subjective:  Patient seen and examined at bedside this morning.  Hemodynamically stable for discharge.  Objective: Vitals:   03/30/19 2016 03/31/19 0549 03/31/19 0803 03/31/19 1256  BP: 133/71 123/72 105/67 102/68  Pulse: 81 89 91 92  Resp: 20 20 20 18   Temp: 98.4 F (36.9 C) 98 F (36.7 C) 98.6 F (37 C) 98.7 F (37.1 C)  TempSrc: Oral  Oral Oral  SpO2: 96% 96% 97% 96%  Weight:  68.1 kg    Height:        Intake/Output Summary (Last 24 hours) at 03/31/2019 1434 Last data  filed at 03/31/2019 0800 Gross per 24 hour  Intake 1163 ml  Output 850 ml  Net 313 ml   Filed Weights   03/26/19 0456 03/30/19 0630 03/31/19 0549  Weight: 68.4 kg 68 kg 68.1 kg    Examination:  General exam: Not in distress,average built HEENT:PERRL,Oral mucosa moist, Ear/Nose normal on gross exam Respiratory system: Bilateral equal air  entry, normal vesicular breath sounds, no wheezes or crackles  Cardiovascular system: S1 & S2 heard, RRR. No JVD, murmurs, rubs, gallops or clicks. Gastrointestinal system: Abdomen is nondistended, soft and nontender. No organomegaly or masses felt. Normal bowel sounds heard. Central nervous system: Alert and oriented. No focal neurological deficits. Extremities: No edema, no clubbing ,no cyanosis, distal peripheral pulses palpable. Skin: Multiple desquamations, blisters, sunburn Ulcers on the bilateral lower extremities Psychiatry: Depressed   Data Reviewed: I have personally reviewed following labs and imaging studies  CBC: Recent Labs  Lab 03/25/19 0314 03/26/19 0303  WBC 12.4* 7.7  NEUTROABS 9.5*  --   HGB 12.1* 10.2*  HCT 37.5* 31.8*  MCV 86.6 87.4  PLT 532* [id number]*   Basic Metabolic Panel: Recent Labs  Lab 03/25/19 0314 03/25/19 0831 03/26/19 0303  NA 134*  --  136  K 4.4  --  3.8  CL 102  --  102  CO2 20*  --  27  GLUCOSE 90  --  80  BUN 9  --  12  CREATININE 0.82  --  0.72  CALCIUM 8.3*  --  8.2*  MG  --  1.9  --    GFR: Estimated Creatinine Clearance: 79.1 mL/min (by C-G formula based on SCr of 0.72 mg/dL). Liver Function Tests: Recent Labs  Lab 03/25/19 0314  AST 28  ALT 31  ALKPHOS 90  BILITOT 0.8  PROT 6.4*  ALBUMIN 3.3*   Recent Labs  Lab 03/25/19 0314  LIPASE 21   No results for input(s): AMMONIA in the last 168 hours. Coagulation Profile: No results for input(s): INR, PROTIME in the last 168 hours. Cardiac Enzymes: No results for input(s): CKTOTAL, CKMB, CKMBINDEX, TROPONINI in the last 168 hours. BNP (last 3 results) No results for input(s): PROBNP in the last 8760 hours. HbA1C: No results for input(s): HGBA1C in the last 72 hours. CBG: No results for input(s): GLUCAP in the last 168 hours. Lipid Profile: No results for input(s): CHOL, HDL, LDLCALC, TRIG, CHOLHDL, LDLDIRECT in the last 72 hours. Thyroid Function Tests: No results  for input(s): TSH, T4TOTAL, FREET4, T3FREE, THYROIDAB in the last 72 hours. Anemia Panel: No results for input(s): VITAMINB12, FOLATE, FERRITIN, TIBC, IRON, RETICCTPCT in the last 72 hours. Sepsis Labs: No results for input(s): PROCALCITON, LATICACIDVEN in the last 168 hours.  Recent Results (from the past 240 hour(s))  SARS CORONAVIRUS 2 (TAT 6-12 HRS) Nasal Swab Aptima Multi Swab     Status: None   Collection Time: 03/25/19  8:21 AM   Specimen: Aptima Multi Swab; Nasal Swab  Result Value Ref Range Status   SARS Coronavirus 2 NEGATIVE NEGATIVE Final    Comment: (NOTE) SARS-CoV-2 target nucleic acids are NOT DETECTED. The SARS-CoV-2 RNA is generally detectable in upper and lower respiratory specimens during the acute phase of infection. Negative results do not preclude SARS-CoV-2 infection, do not rule out co-infections with other pathogens, and should not be used as the sole basis for treatment or other patient management decisions. Negative results must be combined with clinical observations, patient history, and epidemiological information. The expected result is Negative.  Fact Sheet for Patients: SugarRoll.be Fact Sheet for Healthcare Providers: https://www.woods-mathews.com/ This test is not yet approved or cleared by the [place] FDA and  has been authorized for detection and/or diagnosis of SARS-CoV-2 by FDA under an Emergency Use Authorization (EUA). This EUA will remain  in effect (meaning this test can be used) for the duration of the COVID-19 declaration under Section 56 4(b)(1) of the Act, 21 U.S.C. section 360bbb-3(b)(1), unless the authorization is terminated or revoked sooner. Performed at [facility], [place] [address], [place], [place] [address]          Radiology Studies: No results found.      Scheduled Meds: . cephALEXin  500 mg Oral Q8H  . enoxaparin (LOVENOX) injection  40 mg Subcutaneous Q24H   . folic acid  1 mg Oral Daily  . gabapentin  300 mg Oral TID  . multivitamin with minerals  1 tablet Oral Daily  . mupirocin ointment   Topical BID  . nicotine  14 mg Transdermal Daily  . pantoprazole  40 mg Oral BID  . sertraline  50 mg Oral Daily  . sodium chloride flush  3 mL Intravenous Q12H  . thiamine  100 mg Oral Daily  . traZODone  100 mg Oral QHS,MR X 1   Continuous Infusions:    LOS: 5 days    Time spent: 35 mins.More than 50% of that time was spent in counseling and/or coordination of care.      Shelly Coss, MD [facility] Pager [phone]  If 7PM-7AM, please contact night-coverage [email or web address] Password [facility] 03/31/2019, 2:34 PM

## 2019-03-31 NOTE — TOC Progression Note (Signed)
Transition of Care [facility]) - Progression Note    Patient Details  Name: Samuel Reyes MRN: [id number] Date of Birth: [birth year]  Transition [facility] Care [facility]) CM/SW Contact  Eileen Stanford, LCSW Phone Number: 03/31/2019, 3:13 PM  Clinical Narrative:   [place] downstairs to pick up pt. RN notified.     Expected Discharge Plan: Psychiatric Hospital Barriers to Discharge: Continued Medical Work up  Expected Discharge Plan and Services Expected Discharge Plan: Psychiatric Hospital In-house Referral: Clinical Social Work   Post Acute Care Choice: NA(psychiatry) Living arrangements for the past 2 months: Homeless Expected Discharge Date: 03/29/19                                     Social Determinants of Health (SDOH) Interventions    Readmission Risk Interventions No flowsheet data found.

## 2019-03-31 NOTE — TOC Progression Note (Signed)
Transition of Care [facility]) - Progression Note    Patient Details  Name: Samuel Reyes MRN: [id number] Date of Birth: 1955-11-25  Transition of Care [facility]) CM/SW [place], LCSW Phone Number: 03/31/2019, 2:33 PM  Clinical Narrative:   Pt can d/c to [facility] today. Pt will go to room 301 bed 1. Pt will need to transfer before 3. Accepting MD will me Dr. Suzy Bouchard. RN to call report to [phone].    Expected Discharge Plan: Psychiatric Hospital Barriers to Discharge: Continued Medical Work up  Expected Discharge Plan and Services Expected Discharge Plan: Psychiatric Hospital In-house Referral: Clinical Social Work   Post Acute Care Choice: NA(psychiatry) Living arrangements for the past 2 months: Homeless Expected Discharge Date: 03/29/19                                     Social Determinants of Health (SDOH) Interventions    Readmission Risk Interventions No flowsheet data found.

## 2019-04-01 LAB — HEMOGLOBIN A1C
Hgb A1c MFr Bld: 5.5 % (ref 4.8–5.6)
Mean Plasma Glucose: 111.15 mg/dL

## 2019-04-01 LAB — TSH: TSH: 2.303 u[IU]/mL (ref 0.350–4.500)

## 2019-04-01 LAB — LIPID PANEL
Cholesterol: 208 mg/dL — ABNORMAL HIGH (ref 0–200)
HDL: 60 mg/dL (ref >40)
LDL Cholesterol: 134 mg/dL — ABNORMAL HIGH (ref 0–99)
Total CHOL/HDL Ratio: 3.5 RATIO
Triglycerides: 70 mg/dL (ref <150)
VLDL: 14 mg/dL (ref 0–40)

## 2019-04-01 MED ORDER — SILVER SULFADIAZINE 1 % EX CREA
TOPICAL_CREAM | Freq: Two times a day (BID) | CUTANEOUS | Status: DC
  Administered 2019-04-01 (×2): via TOPICAL
  Administered 2019-04-02: 1 via TOPICAL
  Administered 2019-04-05: 08:00:00 via TOPICAL
  Administered 2019-04-06: 1 via TOPICAL
  Filled 2019-04-01: qty 85

## 2019-04-01 MED ORDER — TRAMADOL HCL 50 MG PO TABS
50.0000 mg | ORAL_TABLET | Freq: Four times a day (QID) | ORAL | Status: DC | PRN
  Administered 2019-04-01 – 2019-04-06 (×14): 50 mg via ORAL
  Filled 2019-04-01 (×14): qty 1

## 2019-04-01 NOTE — Progress Notes (Signed)
D:  Patient denied SI and HI, contracts for safety.  Denied A/V hallucinations.  Denied pain. A:  Medications administered per MD orders.  Emotional support and encouragement given patient. R:  Safety maintained with 15 minute checks.  

## 2019-04-01 NOTE — BHH Group Notes (Signed)
Occupational Therapy Group Note  Date:  04/01/2019 Time:  6:54 PM  Group Topic/Focus:  Stress Management  Participation Level:  Active  Participation Quality:  Attentive  Affect:  Flat  Cognitive:  Alert  Insight: Improving  Engagement in Group:  Engaged  Modes of Intervention:  Activity, Discussion, Education and Socialization  Additional Comments:    S: "I have had to rediscover myself since my wife passed"  O: Education given on stress management skills. Pt asked to share current unhealthy coping mechanisms, and how they react to stress. After education, pt asked to share new preferred coping skill as of this date.  A: Pt shares how his main poor coping mechanism is drinking. He mentions how his wife passed as well as his children. He shares how this was extremely devastating and he is still adjusting to the role change. He shares how practicing acceptance will improve his healthy coping because he feels "stuck in the past".  P: OT group will be x1 per week while pt inpatient.  Zenovia Jarred, MSOT, OTR/L Behavioral Health OT/ Acute Relief OT PHP Office: [phone]  Zenovia Jarred 04/01/2019, 6:54 PM

## 2019-04-01 NOTE — BHH Group Notes (Signed)
[place] Group Notes:  (Nursing/MHT/Case Management/Adjunct)  Date:  04/01/2019  Time:  1:30 PM  Type of Therapy:  Nurse Education  Participation Level:  Active  Participation Quality:  Appropriate, Attentive, Sharing and Supportive  Affect:  Appropriate  Cognitive:  Alert and Appropriate  Insight:  Appropriate, Good and Improving  Engagement in Group:  Developing/Improving, Engaged, Improving and Supportive  Modes of Intervention:  Discussion, Education, Exploration and Support  Summary of Progress/Problems: Pt's were asked to discuss personal development through short and long term goal setting. Pt's were asked to share their goal for the day, how they were achieving this, impediments to their goal, and resources they could leverage to overcome those.  Pt stated that their goal was to start thinking of better times with their family whenever they thought about them being deceased. Pt also stated that they need to finds friends for support in [place] for a longer term goal.  Baron Sane 04/01/2019, 5:30 PM

## 2019-04-01 NOTE — Progress Notes (Addendum)
CSW attempted to reach Broadus John, PATH Case Manager at the [facility] in [place] [phone] ext [phone]) as patient states he has been working with [facility] to enter stable or transitional housing.  CSW left a detailed voicemail with callback information.   CSW also attempted to reach staff at [facility] [phone]). Chart review indicates the patient was referred to their program on 08/15. CSW left a detailed voicemail with callback information.   Contact information for the [facility] and SSDI were provided to patient in large print font in addition to [employer].  Stephanie Acre, LCSW-A Clinical Social Worker

## 2019-04-01 NOTE — Progress Notes (Signed)
CSW spoke with [name] from the [facility] regarding the PATH program and transitional housing opportunities for the patient. Vicente Males stated their program now operates with individuals in hotels while waiting for [place] negative screenings, at which time, the client will enter Public librarian or [employer]. Derek provided contact information for [facility]in [phone]. CSW following for disposition.  Stephanie Acre, LCSW-A Clinical Social Worker

## 2019-04-01 NOTE — Tx Team (Signed)
Interdisciplinary Treatment and Diagnostic Plan Update  04/01/2019 Time of Session: 9:00am Samuel Reyes MRN: [id number]  Principal Diagnosis: <principal problem not specified>  Secondary Diagnoses: Active Problems:   MDD (major depressive disorder), recurrent episode, severe (HCC)   Current Medications:  Current Facility-Administered Medications  Medication Dose Route Frequency Provider Last Rate Last Dose  . acetaminophen (TYLENOL) tablet 650 mg  650 mg Oral Q6H PRN Suella Broad, FNP   650 mg at 03/31/19 1858  . albuterol (VENTOLIN HFA) 108 (90 Base) MCG/ACT inhaler 2 puff  2 puff Inhalation Q6H PRN Cobos, Fernando A, MD      . alum & mag hydroxide-simeth (MAALOX/MYLANTA) 200-200-20 MG/5ML suspension 30 mL  30 mL Oral Q4H PRN Burt Ek, Gayland Curry, FNP      . cephALEXin (KEFLEX) capsule 500 mg  500 mg Oral Q8H Cobos, Fernando A, MD      . gabapentin (NEURONTIN) capsule 300 mg  300 mg Oral TID Cobos, Myer Peer, MD   300 mg at 04/01/19 0758  . LORazepam (ATIVAN) tablet 1 mg  1 mg Oral Q6H PRN Cobos, Fernando A, MD      . magnesium hydroxide (MILK OF MAGNESIA) suspension 30 mL  30 mL Oral Daily PRN Starkes-Perry, Gayland Curry, FNP      . multivitamin with minerals tablet 1 tablet  1 tablet Oral Daily Cobos, Myer Peer, MD   1 tablet at 04/01/19 0757  . pantoprazole (PROTONIX) EC tablet 40 mg  40 mg Oral BID Cobos, Myer Peer, MD   40 mg at 04/01/19 0758  . sertraline (ZOLOFT) tablet 50 mg  50 mg Oral Daily Cobos, Myer Peer, MD   50 mg at 04/01/19 0757  . silver sulfADIAZINE (SILVADENE) 1 % cream   Topical BID Cobos, Fernando A, MD      . thiamine (VITAMIN B-1) tablet 100 mg  100 mg Oral Daily Cobos, Myer Peer, MD   100 mg at 04/01/19 0757  . traMADol (ULTRAM) tablet 50 mg  50 mg Oral Q6H PRN Cobos, Myer Peer, MD      . traZODone (DESYREL) tablet 100 mg  100 mg Oral QHS PRN Cobos, Myer Peer, MD   100 mg at 03/31/19 2133   PTA Medications: Medications Prior to Admission   Medication Sig Dispense Refill Last Dose  . albuterol (VENTOLIN HFA) 108 (90 Base) MCG/ACT inhaler Inhale 2 puffs into the lungs every 4 (four) hours as needed for wheezing or shortness of breath. 18 g 1   . Aspirin-Acetaminophen-Caffeine (GOODY HEADACHE PO) Take 1 packet by mouth daily as needed (headache).     . cephALEXin (KEFLEX) 500 MG capsule Take 1 capsule (500 mg total) by mouth every 8 (eight) hours for 3 days. 9 capsule 0   . folic acid (FOLVITE) 1 MG tablet Take 1 tablet (1 mg total) by mouth daily.     Marland Kitchen gabapentin (NEURONTIN) 300 MG capsule Take 1 capsule (300 mg total) by mouth 3 (three) times daily.     . Multiple Vitamin (MULTIVITAMIN WITH MINERALS) TABS tablet Take 1 tablet by mouth daily.     . nicotine (NICODERM CQ - DOSED IN MG/24 HOURS) 14 mg/24hr patch Place 1 patch (14 mg total) onto the skin daily. (Patient not taking: Reported on 03/25/2019) 28 patch 0   . oxyCODONE (OXY IR/ROXICODONE) 5 MG immediate release tablet Take 1 tablet (5 mg total) by mouth every 4 (four) hours as needed for moderate pain. 15 tablet 0   . pantoprazole (PROTONIX)  40 MG tablet Take 1 tablet (40 mg total) by mouth 2 (two) times daily. For reflux 60 tablet 1   . sertraline (ZOLOFT) 50 MG tablet Take 1 tablet (50 mg total) by mouth daily. For mood 30 tablet 1   . thiamine 100 MG tablet Take 1 tablet (100 mg total) by mouth daily.     . traZODone (DESYREL) 100 MG tablet Take 1 tablet (100 mg total) by mouth at bedtime and may repeat dose one time if needed.       Patient Stressors: Financial difficulties Health problems Substance abuse  Patient Strengths: Ability for insight Communication skills  Treatment Modalities: Medication Management, Group therapy, Case management,  1 to 1 session with clinician, Psychoeducation, Recreational therapy.   Physician Treatment Plan for Primary Diagnosis: <principal problem not specified> Long Term Goal(s): Improvement in symptoms so as ready for  discharge Improvement in symptoms so as ready for discharge   Short Term Goals: Ability to identify changes in lifestyle to reduce recurrence of condition will improve Ability to identify triggers associated with substance abuse/mental health issues will improve Ability to identify changes in lifestyle to reduce recurrence of condition will improve Ability to verbalize feelings will improve Ability to disclose and discuss suicidal ideas Ability to demonstrate self-control will improve Ability to identify and develop effective coping behaviors will improve Ability to maintain clinical measurements within normal limits will improve  Medication Management: Evaluate patient's response, side effects, and tolerance of medication regimen.  Therapeutic Interventions: 1 to 1 sessions, Unit Group sessions and Medication administration.  Evaluation of Outcomes: Progressing  Physician Treatment Plan for Secondary Diagnosis: Active Problems:   MDD (major depressive disorder), recurrent episode, severe ([place])  Long Term Goal(s): Improvement in symptoms so as ready for discharge Improvement in symptoms so as ready for discharge   Short Term Goals: Ability to identify changes in lifestyle to reduce recurrence of condition will improve Ability to identify triggers associated with substance abuse/mental health issues will improve Ability to identify changes in lifestyle to reduce recurrence of condition will improve Ability to verbalize feelings will improve Ability to disclose and discuss suicidal ideas Ability to demonstrate self-control will improve Ability to identify and develop effective coping behaviors will improve Ability to maintain clinical measurements within normal limits will improve     Medication Management: Evaluate patient's response, side effects, and tolerance of medication regimen.  Therapeutic Interventions: 1 to 1 sessions, Unit Group sessions and Medication  administration.  Evaluation of Outcomes: Progressing   RN Treatment Plan for Primary Diagnosis: <principal problem not specified> Long Term Goal(s): Knowledge of disease and therapeutic regimen to maintain health will improve  Short Term Goals: Ability to demonstrate self-control, Ability to verbalize feelings will improve, Ability to identify and develop effective coping behaviors will improve and Compliance with prescribed medications will improve  Medication Management: RN will administer medications as ordered by provider, will assess and evaluate patient's response and provide education to patient for prescribed medication. RN will report any adverse and/or side effects to prescribing provider.  Therapeutic Interventions: 1 on 1 counseling sessions, Psychoeducation, Medication administration, Evaluate responses to treatment, Monitor vital signs and CBGs as ordered, Perform/monitor CIWA, COWS, AIMS and Fall Risk screenings as ordered, Perform wound care treatments as ordered.  Evaluation of Outcomes: Progressing   LCSW Treatment Plan for Primary Diagnosis: <principal problem not specified> Long Term Goal(s): Safe transition to appropriate next level of care at discharge, Engage patient in therapeutic group addressing interpersonal concerns.  Short Term  Goals: Engage patient in aftercare planning with referrals and resources, Increase social support, Identify triggers associated with mental health/substance abuse issues and Increase skills for wellness and recovery  Therapeutic Interventions: Assess for all discharge needs, 1 to 1 time with Social worker, Explore available resources and support systems, Assess for adequacy in community support network, Educate family and significant other(s) on suicide prevention, Complete Psychosocial Assessment, Interpersonal group therapy.  Evaluation of Outcomes: Progressing   Progress in Treatment: Attending groups: Yes. Participating in groups:  Yes. Taking medication as prescribed: Yes. Toleration medication: Yes. Family/Significant other contact made: No, will contact:  supports if consents are granted. Patient understands diagnosis: Yes. Discussing patient identified problems/goals with staff: Yes. Medical problems stabilized or resolved: No. Denies suicidal/homicidal ideation: No. Issues/concerns per patient self-inventory: Yes.  New problem(s) identified: Yes, Describe:  homelessness, limited income, limited supports  New Short Term/Long Term Goal(s): detox, medication management for mood stabilization; elimination of SI thoughts; development of comprehensive mental wellness/sobriety plan.  Patient Goals: "Get this coconut straightened out."  Discharge Plan or Barriers: CSW continuing to assess for appropriate referrals.   Reason for Continuation of Hospitalization: Anxiety Depression Suicidal ideation  Estimated Length of Stay: 3-5 days  Attendees: Patient: Samuel Reyes 04/01/2019 9:33 AM  Physician: Larene Beach 04/01/2019 9:33 AM  Nursing: Legrand Como RN 04/01/2019 9:33 AM  RN Care Manager: 04/01/2019 9:33 AM  Social Worker: Stephanie Acre, [place] 04/01/2019 9:33 AM  Recreational Therapist:  04/01/2019 9:33 AM  Other: Harriett Sine, NP 04/01/2019 9:33 AM  Other:  04/01/2019 9:33 AM  Other: 04/01/2019 9:33 AM    Scribe for Treatment Team: Joellen Jersey, [place] 04/01/2019 9:33 AM

## 2019-04-01 NOTE — BHH Suicide Risk Assessment (Signed)
[place] INPATIENT:  Family/Significant Other Suicide Prevention Education  Suicide Prevention Education:  Patient Refusal for Family/Significant Other Suicide Prevention Education: The patient Samuel Reyes has refused to provide written consent for family/significant other to be provided Family/Significant Other Suicide Prevention Education during admission and/or prior to discharge.  Physician notified.  Joellen Jersey 04/01/2019, 3:22 PM

## 2019-04-01 NOTE — Progress Notes (Signed)
D: Patient denies SI, HI or AVH. Patient presents as anxious and depressed but pleasant and cooperative.  Pt. Spoke of the losses of his wife and two sons.  He expressed being thankful that he is still alive but states he is still grieving.  Pt. Did not attend evening wrap up group.  A: Patient given emotional support from RN. Patient encouraged to come to staff with concerns and/or questions. Patient's medication routine continued. Patient's orders and plan of care reviewed.   R: Patient remains appropriate and cooperative. Will continue to monitor patient q15 minutes for safety.
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
## 2019-04-02 MED ORDER — NICOTINE 21 MG/24HR TD PT24
21.0000 mg | MEDICATED_PATCH | Freq: Every day | TRANSDERMAL | Status: DC
  Administered 2019-04-02 – 2019-04-06 (×5): 21 mg via TRANSDERMAL
  Filled 2019-04-02 (×7): qty 1

## 2019-04-02 NOTE — Progress Notes (Addendum)
[facility] MD Progress Note  04/02/2019 11:23 AM Samuel Reyes  MRN:  [id number]  Subjective: Samuel Reyes reports, "I'm doing fair. I ain't got no alcohol withdrawal symptoms, just unhappy, sad. I just feel like I want to live no more. I'm alone, I ain't got nothing to live for no more. I don't want to live in the woods no more".  Objective: Samuel Reyes is a 63 y old Caucasian male, currently homeless. Presented for nausea, abdominal pain, hyponatremia, severe sunburn on lower extremities. History of alcohol dependence, reports was drinking up to a case of beer up to admission on 8/25. Initially admitted to medical unit, transferred to [facility] on medical clearance due to depression and reported SI. 04-02-19, Samuel Reyes is seen, chart reviewed. The chart findings discussed with the treatment team. He presents alert, oriented & aware of situation. He is visible on the unit, attending group sessions. He presents with a depressed/flat affects. He reports still feeling very depressed, hopeless & helpless. He says his sadness/depression is coming from loneliness, joblessness & homelessness. Patient feels like he has nothing else to live for after the deaths of his 2 sons, then his wife. He continues to endorse passive SI, denies any intent or plans to hurt himself or others. He denies any alcohol withdrawal symptoms. He is requesting assistance in getting into a substance abuse program after discharge & to secure a place to live after the rehab. He rates his anxiety at #7 today & depression at #8. He denies any HI, AVH, delusional thoughts or paranoia. He does not appear to be responding to any internal stimuli. Samuel Reyes is in agreement to continue current plan of care as already in progress.  Principal Problem: Alcohol dependence, alcohol induced mood disorder versus MDD  Diagnosis: Active Problems:   MDD (major depressive disorder), recurrent episode, severe ([place])  Total Time spent with patient: 15 minutes  Past Psychiatric  History: Major depressive disorder  Past Medical History:  Past Medical History:  Diagnosis Date  . Alcoholism ([place])   . Barrett's esophagus   . CAD (coronary artery disease) 2002   MI, no intervention required  . COPD (chronic obstructive pulmonary disease) (HCC)    not on home O2  . Depression   . DTs (delirium tremens) ([place])   . GERD (gastroesophageal reflux disease)   . Headache   . Homeless   . Hypertension   . Stroke [facility]) 2006    Past Surgical History:  Procedure Laterality Date  . BACK SURGERY     3 cervical spine surgeries C4-C5 fused  . COLONOSCOPY N/A 01/04/2014   Procedure: COLONOSCOPY;  Surgeon: Danie Binder, MD;  Location: [facility] ENDO SUITE;  Service: Endoscopy;  Laterality: N/A;  1:45  . ESOPHAGOGASTRODUODENOSCOPY N/A 01/04/2014   Procedure: ESOPHAGOGASTRODUODENOSCOPY (EGD);  Surgeon: Danie Binder, MD;  Location: [facility] ENDO SUITE;  Service: Endoscopy;  Laterality: N/A;  . FINGER SURGERY Left    2nd, 3rd, & 4th fingers were cut off by table saw and reattached  . GASTRECTOMY    . HERNIA REPAIR    . INCISIONAL HERNIA REPAIR N/A 01/20/2014   Procedure: LAPAROSCOPIC RECURRENT  INCISIONAL HERNIA with mesh;  Surgeon: Edward Jolly, MD;  Location: [facility] ORS;  Service: General;  Laterality: N/A;  . rt knee arthroscopic surgery    . SHOULDER SURGERY Bilateral    3 surgeries on on left, 2 surgeries on right    Family History:  Family History  Problem Relation Age of Onset  . Cancer Father  bone  . Cancer Brother        lungs  . Stroke Maternal Grandmother   . Asthma Son        died at age 52 in his sleep   . Spina bifida Son        died at age 88   . Dementia Mother   . Colon cancer Neg Hx    Family Psychiatric  History: See H&P  Social History:  Social History   Substance and Sexual Activity  Alcohol Use Yes  . Alcohol/week: 15.0 standard drinks  . Types: 15 Cans of beer per week   Comment: not drink for 8 weeks     Social History   Substance and  Sexual Activity  Drug Use No   Comment: denied using any drugs    Social History   Socioeconomic History  . Marital status: Widowed    Spouse name: Not on file  . Number of children: 3  . Years of education: Not on file  . Highest education level: Not on file  Occupational History  . Occupation: Disability  Social Needs  . Financial resource strain: Not on file  . Food insecurity    Worry: Not on file    Inability: Not on file  . Transportation needs    Medical: Yes    Non-medical: Yes  Tobacco Use  . Smoking status: Current Every Day Smoker    Packs/day: 1.00    Years: 52.00    Pack years: 52.00    Types: Cigarettes    Start date: 07/30/1966  . Smokeless tobacco: Never Used  . Tobacco comment: uses nicotine patch  Substance and Sexual Activity  . Alcohol use: Yes    Alcohol/week: 15.0 standard drinks    Types: 15 Cans of beer per week    Comment: not drink for 8 weeks  . Drug use: No    Comment: denied using any drugs  . Sexual activity: Not Currently  Lifestyle  . Physical activity    Days per week: Not on file    Minutes per session: Not on file  . Stress: Not on file  Relationships  . Social Herbalist on phone: Not on file    Gets together: Not on file    Attends religious service: Not on file    Active member of club or organization: Not on file    Attends meetings of clubs or organizations: Not on file    Relationship status: Not on file  Other Topics Concern  . Not on file  Social History Narrative  . Not on file   Additional Social History:   Sleep: Fair  Appetite:  Improving  Current Medications: Current Facility-Administered Medications  Medication Dose Route Frequency Provider Last Rate Last Dose  . acetaminophen (TYLENOL) tablet 650 mg  650 mg Oral Q6H PRN Suella Broad, FNP   650 mg at 04/01/19 2103  . albuterol (VENTOLIN HFA) 108 (90 Base) MCG/ACT inhaler 2 puff  2 puff Inhalation Q6H PRN Cobos, Fernando A, MD      .  alum & mag hydroxide-simeth (MAALOX/MYLANTA) 200-200-20 MG/5ML suspension 30 mL  30 mL Oral Q4H PRN Burt Ek, Gayland Curry, FNP      . cephALEXin (KEFLEX) capsule 500 mg  500 mg Oral Q8H Cobos, Myer Peer, MD   500 mg at 04/02/19 [id number]  . gabapentin (NEURONTIN) capsule 300 mg  300 mg Oral TID Cobos, Myer Peer, MD   300 mg at  04/02/19 0819  . LORazepam (ATIVAN) tablet 1 mg  1 mg Oral Q6H PRN Cobos, Fernando A, MD      . magnesium hydroxide (MILK OF MAGNESIA) suspension 30 mL  30 mL Oral Daily PRN Starkes-Perry, Gayland Curry, FNP      . multivitamin with minerals tablet 1 tablet  1 tablet Oral Daily Cobos, Myer Peer, MD   1 tablet at 04/02/19 0819  . nicotine (NICODERM CQ - dosed in mg/24 hours) patch 21 mg  21 mg Transdermal Daily Cobos, Myer Peer, MD   21 mg at 04/02/19 1040  . pantoprazole (PROTONIX) EC tablet 40 mg  40 mg Oral BID Cobos, Myer Peer, MD   40 mg at 04/02/19 0820  . sertraline (ZOLOFT) tablet 50 mg  50 mg Oral Daily Cobos, Myer Peer, MD   50 mg at 04/02/19 0820  . silver sulfADIAZINE (SILVADENE) 1 % cream   Topical BID Cobos, Myer Peer, MD   1 application at [id number]  . thiamine (VITAMIN B-1) tablet 100 mg  100 mg Oral Daily Cobos, Myer Peer, MD   100 mg at 04/02/19 0820  . traMADol (ULTRAM) tablet 50 mg  50 mg Oral Q6H PRN Cobos, Myer Peer, MD   50 mg at 04/02/19 [id number]  . traZODone (DESYREL) tablet 100 mg  100 mg Oral QHS PRN Cobos, Myer Peer, MD   100 mg at 04/01/19 2103   Lab Results:  Results for orders placed or performed during the hospital encounter of 03/31/19 (from the past 48 hour(s))  Hemoglobin A1c     Status: None   Collection Time: 04/01/19  6:36 AM  Result Value Ref Range   Hgb A1c MFr Bld 5.5 4.8 - 5.6 %    Comment: (NOTE) Pre diabetes:          5.7%-6.4% Diabetes:              >6.4% Glycemic control for   <7.0% adults with diabetes    Mean Plasma Glucose 111.15 mg/dL    Comment: Performed at [facility], [place] [address] [place], [place]  [address]  Lipid panel     Status: Abnormal   Collection Time: 04/01/19  6:36 AM  Result Value Ref Range   Cholesterol 208 (H) 0 - 200 mg/dL   Triglycerides 70 <150 mg/dL   HDL 60 >40 mg/dL   Total CHOL/HDL Ratio 3.5 RATIO   VLDL 14 0 - 40 mg/dL   LDL Cholesterol 134 (H) 0 - 99 mg/dL    Comment:        Total Cholesterol/HDL:CHD Risk Coronary Heart Disease Risk Table                     Men   Women  1/2 Average Risk   3.4   3.3  Average Risk       5.0   4.4  2 X Average Risk   9.6   7.1  3 X Average Risk  23.4   11.0        Use the calculated Patient Ratio above and the CHD Risk Table to determine the patient's CHD Risk.        ATP III CLASSIFICATION (LDL):  <100     mg/dL   Optimal  100-129  mg/dL   Near or Above                    Optimal  130-159  mg/dL   Borderline  160-189  mg/dL   High  >190     mg/dL   Very High Performed at [facility] [address], [place], [place] [address]   TSH     Status: None   Collection Time: 04/01/19  6:36 AM  Result Value Ref Range   TSH 2.303 0.350 - 4.500 uIU/mL    Comment: Performed by a 3rd Generation assay with a functional sensitivity of <=0.01 uIU/mL. Performed at [facility], [place] [address], [place], [place] [address]    Blood Alcohol level:  Lab Results  Component Value Date   ETH 213 (H) 03/13/2019   ETH 42 (H) [id number]   Metabolic Disorder Labs: Lab Results  Component Value Date   HGBA1C 5.5 04/01/2019   MPG 111.15 04/01/2019   MPG 108 06/26/2017   No results found for: PROLACTIN Lab Results  Component Value Date   CHOL 208 (H) 04/01/2019   TRIG 70 04/01/2019   HDL 60 04/01/2019   CHOLHDL 3.5 04/01/2019   VLDL 14 04/01/2019   LDLCALC 134 (H) 04/01/2019   LDLCALC 46 03/18/2019   Physical Findings:  AIMS: Facial and Oral Movements Muscles of Facial Expression: None, normal Lips and Perioral Area: None, normal Jaw: None, normal Tongue: None, normal,Extremity  Movements Upper (arms, wrists, hands, fingers): None, normal Lower (legs, knees, ankles, toes): None, normal, Trunk Movements Neck, shoulders, hips: None, normal, Overall Severity Severity of abnormal movements (highest score from questions above): None, normal Incapacitation due to abnormal movements: None, normal Patient's awareness of abnormal movements (rate only patient's report): No Awareness, Dental Status Current problems with teeth and/or dentures?: No Does patient usually wear dentures?: No  CIWA:  CIWA-Ar Total: 2 COWS:  COWS Total Score: 3  Musculoskeletal: Strength & Muscle Tone: within normal limits-no significant tremors, no diaphoresis, no restlessness Gait & Station: normal Patient leans: N/A  Psychiatric Specialty Exam: Physical Exam  Nursing note and vitals reviewed. Constitutional: He appears well-developed.  Neck: Normal range of motion.  Cardiovascular:  Elevated pulse rate  Respiratory: No respiratory distress. He has no wheezes.  Genitourinary:    Genitourinary Comments: Deferred   Musculoskeletal: Normal range of motion.  Neurological: He is alert.  Skin: Skin is warm.    Review of Systems  Constitutional: Negative for chills and fever.  Respiratory: Negative for cough, shortness of breath and wheezing.   Cardiovascular: Negative for chest pain and palpitations.  Gastrointestinal: Negative for heartburn, nausea and vomiting.  Neurological: Negative for headaches.  Psychiatric/Behavioral: Positive for depression, substance abuse (Hx. alcoholism, chronic) and suicidal ideas (Denies any plans or intent to hurt himself or others). Negative for hallucinations and memory loss. The patient is nervous/anxious and has insomnia.    no chest pain, no dyspnea at room air, no vomiting, pain of lower extremities as described above  Blood pressure 117/66, pulse (!) 104, temperature 97.7 F (36.5 C), temperature source Oral, resp. rate 18, height 5\' 4"  (1.626 m),  weight 67.6 kg, SpO2 96 %.Body mass index is 25.58 kg/m.  General Appearance: Fairly groomed  Eye Contact:  Good  Speech:  Normal Rate  Volume:  Normal  Mood:  Mood is improving, vaguely anxious/depressed  Affect:  Congruent  Thought Process:  Linear and Descriptions of Associations: Intact  Orientation:  Full (Time, Place, and Person)  Thought Content:  No hallucinations, no delusions, not internally preoccupied  Suicidal Thoughts:  "I just don't want to live any more, I got nothing to live for"  Homicidal Thoughts:  Denies  Memory:  Immediate;   Good Recent;   Good Remote;   Good  Judgement:  Fair/improving  Insight:  Fair  Psychomotor Activity:  Normal  Concentration:  Concentration: Good and Attention Span: Good  Recall:  Good  Fund of Knowledge:  Good  Language:  Good  Akathisia:  Negative  Handed:  Right  AIMS (if indicated):     Assets:  Communication Skills Desire for Improvement Resilience  ADL's:  Intact  Cognition:  WNL  Sleep:  Number of Hours: 5   Per previous Assessment: Of note, chart note indicates past history of a prolonged QTC.  EKGs reviewed-8/18 QTc 427, 8/26 QTc 463, EKG done earlier today NSR, heart rate 93, QTc 425  Treatment Plan Summary: Daily contact with patient to assess and evaluate symptoms and progress in treatment, Medication management, Plan Inpatient treatment and Medication as below:  - Encourage group and milieu participation to work on coping skills and symptom reduction  - Encourage efforts to work on sobriety/abstinence  - Treatment team working on disposition planning options  - Continue Zoloft 50 mg daily for depression  - Continue Neurontin 300 mg 3 times daily for anxiety, pain, alcohol use disorder  - Continue Keflex 500 mg every 8 hours  - Continue trazodone 100 mg nightly PRN for insomnia  - Silvadene cream topically to affected areas as needed  - Ativan PRN for potential alcohol withdrawal if needed  - Ultram 50  mg every 6 hours PRN for pain as needed  Lindell Spar, NP, PMHNP, FNP-BC 04/02/2019, 11:23 AMPatient ID: Scherrie Merritts, male   DOB: July 19, 1956, 63 y.o.   MRN: [id number] Attest to NP Progress Note

## 2019-04-02 NOTE — B[facility] Group Notes (Signed)
[facility] Group Notes:  (Nursing/MHT/Case Management/Adjunct)  Date:  04/02/2019  Time:  10:00 PM  Type of Therapy:  Nurse Education  Participation Level:  Active  Participation Quality:  Appropriate, Attentive, Sharing and Supportive  Affect:  Appropriate  Cognitive:  Alert and Appropriate  Insight:  Appropriate, Good and Improving  Engagement in Group:  Developing/Improving, Engaged, Improving and Supportive  Modes of Intervention:  Discussion, Education, Exploration, Socialization and Support  Summary of Progress/Problems: pt's were asked to discuss crisis present and past. Pt's were guided through triggers or events they feel lead to the need for crisis management. Pt's stated their goal for today regarding crisis management, how this goal would help decrease future crisis, and how they were going to achieve this. Lastly, Pt's discussed alternative interventions they could use instead of negative coping strategies currently being utilized.  Pt shared multiple times and stated he had heard various ways he could deal with crisis in the future.   Otelia Limes Uday Jantz 04/02/2019, 12:23 PM

## 2019-04-02 NOTE — BHH Group Notes (Signed)
Adult Psychoeducational Group Note  Date:  04/02/2019 Time:  9:22 PM  Group Topic/Focus:  Wrap-Up Group:   The focus of this group is to help patients review their daily goal of treatment and discuss progress on daily workbooks.  Participation Level:  Active  Participation Quality:  Appropriate and Attentive  Affect:  Appropriate  Cognitive:  Alert and Appropriate  Insight: Appropriate and Good  Engagement in Group:  Engaged  Modes of Intervention:  Discussion and Education  Additional Comments:  Pt attended and participated in wrap up group this evening and rated their day a 7/10. Pt is working on their goal to get a hold of [employer] employee and to call social security.   Cristi Loron 04/02/2019, 9:22 PM

## 2019-04-02 NOTE — BHH Group Notes (Signed)
LCSW Aftercare Discharge Planning Group Note  04/02/2019 8:45am  Type of Group and Topic: Psychoeducational Group: Discharge Planning  Participation Level: Active  Description of Group  Discharge planning group reviews patient's anticipated discharge plans and assists patients to anticipate and address any barriers to wellness/recovery in the community. Suicide prevention education is reviewed with patients in group.  Therapeutic Goals  1. Patients will state their anticipated discharge plan and mental health aftercare  2. Patients will identify potential barriers to wellness in the community setting  3. Patients will engage in problem solving, solution focused discussion of ways to anticipate and address barriers to wellness/recovery  Summary of Patient Progress  Plan for Discharge/Comments: Marena Chancy about his living situation, this is of most concern to him. Agreeable to outpatient follow up.  Transportation Means: Public transit  Supports:None. His immediate family is dead and he reports he has no other supports.  Therapeutic Modalities:  [place], [place]  04/02/2019 3:13 PM

## 2019-04-02 NOTE — Progress Notes (Signed)
Pt did attend the evening wrap up group.

## 2019-04-03 NOTE — B[facility] Group Notes (Signed)
LCSW Group Therapy Note 04/03/2019 11:46 AM  Type of Therapy/Topic: Group Therapy: Feelings about Diagnosis  Participation Level: Active   Description of Group:  This group will allow patients to explore their thoughts and feelings about diagnoses they have received. Patients will be guided to explore their level of understanding and acceptance of these diagnoses. Facilitator will encourage patients to process their thoughts and feelings about the reactions of others to their diagnosis and will guide patients in identifying ways to discuss their diagnosis with significant others in their lives. This group will be process-oriented, with patients participating in exploration of their own experiences, giving and receiving support, and processing challenge from other group members.  Therapeutic Goals: 1. Patient will demonstrate understanding of diagnosis as evidenced by identifying two or more symptoms of the disorder 2. Patient will be able to express two feelings regarding the diagnosis 3. Patient will demonstrate their ability to communicate their needs through discussion and/or role play  Summary of Patient Progress:  [place] was engaged and participated throughout the group session. Peregrin reports that "being drunk" is his main issue. Kunj states that he used his drinking habits to cope with past traumas and grief.      Therapeutic Modalities:  Cognitive Behavioral Therapy Brief Therapy Feelings Identification    [place] Clinical Social Worker

## 2019-04-03 NOTE — Progress Notes (Signed)
Patient ID: Samuel Reyes, male   DOB: 08/11/1955, 63 y.o.   MRN: [id number] Pt has been appropriate and cooperative on approach. Positive for all unit activities with minimal prompting. Has been active in the milieu. Pt is working on a d/c plan, finding out how to reinstate his disablilty and finding a place to live. Pt acknowledges related to multiple losses. Continues to c/o pain to feet bilaterally from sunburn. Contracts for safety.  Support and encouragement provided. Med ed reinforced. Dressing change offered, pt says he wants to wait until bedtime. Pt is cooperative and receptive.

## 2019-04-03 NOTE — Progress Notes (Signed)
[facility] MD Progress Note  04/03/2019 11:18 AM Samuel Reyes  MRN:  [id number] Subjective: Patient is a 63 year old male with a past psychiatric history significant for alcohol dependence who was admitted on 03/31/2019 secondary to alcohol and depression.  Objective: Patient is seen and examined.  Patient is a 63 year old male with the above-stated past psychiatric history who is seen in follow-up.  Review of the notes revealed that is been calm, but he does report bilateral foot pain.  We discussed discharge planning, and he stated that he had foot pain and did not know where he was going to go.  He denied suicidal ideation.  Review of his laboratories revealed normal liver function enzymes, a mild anemia with a hemoglobin of 10.2 and hematocrit of 31.8.  Blood alcohol on admission was 213.  He stated the majority of his withdrawal symptoms resolved while he was in the medical hospital.  His vital signs are stable, he is afebrile.  He slept 6 hours last night.  He continues on gabapentin, lorazepam, pantoprazole, sertraline, Silvadene, trazodone and tramadol.  Principal Problem: <principal problem not specified> Diagnosis: Active Problems:   MDD (major depressive disorder), recurrent episode, severe ([place])  Total Time spent with patient: 20 minutes  Past Psychiatric History: See admission H&P  Past Medical History:  Past Medical History:  Diagnosis Date  . Alcoholism ([place])   . Barrett's esophagus   . CAD (coronary artery disease) 2002   MI, no intervention required  . COPD (chronic obstructive pulmonary disease) (HCC)    not on home O2  . Depression   . DTs (delirium tremens) ([place])   . GERD (gastroesophageal reflux disease)   . Headache   . Homeless   . Hypertension   . Stroke [facility]) 2006    Past Surgical History:  Procedure Laterality Date  . BACK SURGERY     3 cervical spine surgeries C4-C5 fused  . COLONOSCOPY N/A 01/04/2014   Procedure: COLONOSCOPY;  Surgeon: Danie Binder, MD;   Location: [facility] ENDO SUITE;  Service: Endoscopy;  Laterality: N/A;  1:45  . ESOPHAGOGASTRODUODENOSCOPY N/A 01/04/2014   Procedure: ESOPHAGOGASTRODUODENOSCOPY (EGD);  Surgeon: Danie Binder, MD;  Location: [facility]O SUITE;  Service: Endoscopy;  Laterality: N/A;  . FINGER SURGERY Left    2nd, 3rd, & 4th fingers were cut off by table saw and reattached  . GASTRECTOMY    . HERNIA REPAIR    . INCISIONAL HERNIA REPAIR N/A 01/20/2014   Procedure: LAPAROSCOPIC RECURRENT  INCISIONAL HERNIA with mesh;  Surgeon: Edward Jolly, MD;  Location: [facility] ORS;  Service: General;  Laterality: N/A;  . rt knee arthroscopic surgery    . SHOULDER SURGERY Bilateral    3 surgeries on on left, 2 surgeries on right    Family History:  Family History  Problem Relation Age of Onset  . Cancer Father        bone  . Cancer Brother        lungs  . Stroke Maternal Grandmother   . Asthma Son        died at age 25 in his sleep   . Spina bifida Son        died at age 59   . Dementia Mother   . Colon cancer Neg Hx    Family Psychiatric  History: See admission H&P Social History:  Social History   Substance and Sexual Activity  Alcohol Use Yes  . Alcohol/week: 15.0 standard drinks  . Types: 15 Cans of beer  per week   Comment: not drink for 8 weeks     Social History   Substance and Sexual Activity  Drug Use No   Comment: denied using any drugs    Social History   Socioeconomic History  . Marital status: Widowed    Spouse name: Not on file  . Number of children: 3  . Years of education: Not on file  . Highest education level: Not on file  Occupational History  . Occupation: Disability  Social Needs  . Financial resource strain: Not on file  . Food insecurity    Worry: Not on file    Inability: Not on file  . Transportation needs    Medical: Yes    Non-medical: Yes  Tobacco Use  . Smoking status: Current Every Day Smoker    Packs/day: 1.00    Years: 52.00    Pack years: 52.00    Types: Cigarettes     Start date: 07/30/1966  . Smokeless tobacco: Never Used  . Tobacco comment: uses nicotine patch  Substance and Sexual Activity  . Alcohol use: Yes    Alcohol/week: 15.0 standard drinks    Types: 15 Cans of beer per week    Comment: not drink for 8 weeks  . Drug use: No    Comment: denied using any drugs  . Sexual activity: Not Currently  Lifestyle  . Physical activity    Days per week: Not on file    Minutes per session: Not on file  . Stress: Not on file  Relationships  . Social Herbalist on phone: Not on file    Gets together: Not on file    Attends religious service: Not on file    Active member of club or organization: Not on file    Attends meetings of clubs or organizations: Not on file    Relationship status: Not on file  Other Topics Concern  . Not on file  Social History Narrative  . Not on file   Additional Social History:                         Sleep: Fair  Appetite:  Good  Current Medications: Current Facility-Administered Medications  Medication Dose Route Frequency Provider Last Rate Last Dose  . acetaminophen (TYLENOL) tablet 650 mg  650 mg Oral Q6H PRN Suella Broad, FNP   650 mg at 04/02/19 1124  . albuterol (VENTOLIN HFA) 108 (90 Base) MCG/ACT inhaler 2 puff  2 puff Inhalation Q6H PRN Cobos, Myer Peer, MD      . alum & mag hydroxide-simeth (MAALOX/MYLANTA) 200-200-20 MG/5ML suspension 30 mL  30 mL Oral Q4H PRN Starkes-Perry, Gayland Curry, FNP      . gabapentin (NEURONTIN) capsule 300 mg  300 mg Oral TID Cobos, Myer Peer, MD   300 mg at 04/03/19 0747  . LORazepam (ATIVAN) tablet 1 mg  1 mg Oral Q6H PRN Cobos, Fernando A, MD      . magnesium hydroxide (MILK OF MAGNESIA) suspension 30 mL  30 mL Oral Daily PRN Starkes-Perry, Gayland Curry, FNP      . multivitamin with minerals tablet 1 tablet  1 tablet Oral Daily Cobos, Myer Peer, MD   1 tablet at 04/03/19 0747  . nicotine (NICODERM CQ - dosed in mg/24 hours) patch 21 mg  21 mg  Transdermal Daily Cobos, Myer Peer, MD   21 mg at 04/03/19 0748  . pantoprazole (PROTONIX) EC  tablet 40 mg  40 mg Oral BID Cobos, Myer Peer, MD   40 mg at 04/03/19 0747  . sertraline (ZOLOFT) tablet 50 mg  50 mg Oral Daily Cobos, Myer Peer, MD   50 mg at 04/03/19 0747  . silver sulfADIAZINE (SILVADENE) 1 % cream   Topical BID Cobos, Myer Peer, MD   1 application at [id number]  . thiamine (VITAMIN B-1) tablet 100 mg  100 mg Oral Daily Cobos, Myer Peer, MD   100 mg at 04/03/19 0747  . traMADol (ULTRAM) tablet 50 mg  50 mg Oral Q6H PRN Cobos, Myer Peer, MD   50 mg at 04/03/19 0747  . traZODone (DESYREL) tablet 100 mg  100 mg Oral QHS PRN Cobos, Myer Peer, MD   100 mg at 04/02/19 2129    Lab Results: No results found for this or any previous visit (from the past 48 hour(s)).  Blood Alcohol level:  Lab Results  Component Value Date   ETH 213 (H) 03/13/2019   ETH 42 (H) [id number]    Metabolic Disorder Labs: Lab Results  Component Value Date   HGBA1C 5.5 04/01/2019   MPG 111.15 04/01/2019   MPG 108 06/26/2017   No results found for: PROLACTIN Lab Results  Component Value Date   CHOL 208 (H) 04/01/2019   TRIG 70 04/01/2019   HDL 60 04/01/2019   CHOLHDL 3.5 04/01/2019   VLDL 14 04/01/2019   LDLCALC 134 (H) 04/01/2019   LDLCALC 46 03/18/2019    Physical Findings: AIMS: Facial and Oral Movements Muscles of Facial Expression: None, normal Lips and Perioral Area: None, normal Jaw: None, normal Tongue: None, normal,Extremity Movements Upper (arms, wrists, hands, fingers): None, normal Lower (legs, knees, ankles, toes): None, normal, Trunk Movements Neck, shoulders, hips: None, normal, Overall Severity Severity of abnormal movements (highest score from questions above): None, normal Incapacitation due to abnormal movements: None, normal Patient's awareness of abnormal movements (rate only patient's report): No Awareness, Dental Status Current problems with teeth and/or  dentures?: No Does patient usually wear dentures?: No  CIWA:  CIWA-Ar Total: 0 COWS:  COWS Total Score: 3  Musculoskeletal: Strength & Muscle Tone: within normal limits Gait & Station: normal Patient leans: N/A  Psychiatric Specialty Exam: Physical Exam  Nursing note and vitals reviewed. Constitutional: He is oriented to person, place, and time. He appears well-developed and well-nourished.  HENT:  Head: Normocephalic and atraumatic.  Respiratory: Effort normal.  Neurological: He is alert and oriented to person, place, and time.    ROS  Blood pressure 96/86, pulse 93, temperature 97.7 F (36.5 C), temperature source Oral, resp. rate 16, height 5\' 4"  (1.626 m), weight 67.6 kg, SpO2 96 %.Body mass index is 25.58 kg/m.  General Appearance: Disheveled  Eye Contact:  Fair  Speech:  Normal Rate  Volume:  Normal  Mood:  Anxious  Affect:  Congruent  Thought Process:  Coherent and Descriptions of Associations: Circumstantial  Orientation:  Full (Time, Place, and Person)  Thought Content:  Logical  Suicidal Thoughts:  No  Homicidal Thoughts:  No  Memory:  Immediate;   Fair Recent;   Fair Remote;   Fair  Judgement:  Intact  Insight:  Fair  Psychomotor Activity:  Increased  Concentration:  Concentration: Fair and Attention Span: Fair  Recall:  [employer] of Knowledge:  Fair  Language:  Fair  Akathisia:  Negative  Handed:  Right  AIMS (if indicated):     Assets:  Desire for Improvement Resilience  ADL's:  Intact  Cognition:  WNL  Sleep:  Number of Hours: 6     Treatment Plan Summary: Daily contact with patient to assess and evaluate symptoms and progress in treatment, Medication management and Plan : Patient is seen and examined.  Patient is a 63 year old male with the above-stated past psychiatric history who is seen in follow-up.   Diagnosis: #1 alcohol dependence, #2 alcohol withdrawal, #3 substance-induced mood disorder, #4 COPD, #5 chronic pain  Patient is seen in  follow-up.  He is doing well.  We discussed discharge planning.  We discussed the fact that rehab places have 3 to 4-week waiting list currently, and the homeless shelters are full.  I encouraged him to get on the phone and start calling [place] [facility] given his need for housing.  I am going to stop the lorazepam today.  No other changes in his medications. 1.  Continue albuterol HFA 2 puffs every 6 as needed wheezing. 2.  Continue gabapentin 300 mg p.o. 3 times daily for chronic pain. 3.  Continue multivitamin 1 tablet p.o. daily for nutritional supplementation. 4.  Continue pantoprazole 40 mg p.o. twice daily for gastric protection and Barrett's esophagus. 5.  Continue sertraline 50 mg p.o. daily for mood and anxiety. 6.  Continue Silvadene bilateral ankles and feet twice daily for wounds. 7.  Continue tramadol 50 mg p.o. every 6 hours as needed pain. 8.  Continue trazodone 100 mg p.o. nightly as needed insomnia. 9.  Stop Ativan. 10.  Disposition planning-in progress.  Sharma Covert, MD 04/03/2019, 11:18 AM

## 2019-04-03 NOTE — Progress Notes (Signed)
Patient ID: Samuel Reyes, male   DOB: 06-16-1956, 63 y.o.   MRN: [id number] D: Patient is calm and cooperative on approach. Pt reported bilateral foot pain. Pt stated he did a lot walking and probably stayed too much on his feet. Pt denies any other needs. Denies  SI/HI/AVH.No behavioral issues noted.  A: Support and encouragement offered as needed to express needs. Medications administered as prescribed.  R: Patient is safe and cooperative on unit. Will continue to monitor  for safety and stability.

## 2019-04-03 NOTE — Progress Notes (Signed)
Recreation Therapy Notes  Date:  9.4.20 Time: 0930 Location: [address] Dayroom  Group Topic: Stress Management  Goal Area(s) Addresses:  Patient will identify positive stress management techniques. Patient will identify benefits of using stress management post d/c.  Intervention: Stress Management  Activity : Meditation.  LRT played a meditation that focused on the power of being resilient in the face of adversity.  Patients were to listen as meditation played to follow along.  Education:  Stress Management, Discharge Planning.   Education Outcome: Acknowledges Education  Clinical Observations/Feedback: Pt did not attend group.    [name], LRT/CTRS         Ria Comment, Dacey Milberger A 04/03/2019 10:51 AM

## 2019-04-04 NOTE — Progress Notes (Signed)
Adult Psychoeducational Group Note  Date:  04/04/2019 Time:  7:12 PM  Group Topic/Focus:  Goals Group:   The focus of this group is to help patients establish daily goals to achieve during treatment and discuss how the patient can incorporate goal setting into their daily lives to aide in recovery.  Participation Level:  Active  Participation Quality:  Appropriate  Affect:  Appropriate  Cognitive:  Alert  Insight: Appropriate  Engagement in Group:  Engaged  Modes of Intervention:  Discussion  Additional Comments:  Pt attended group and participated in discussion.  Jannie Doyle R Brondon Wann 04/04/2019, 7:12 PM
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
## 2019-04-04 NOTE — Progress Notes (Signed)
[facility] Group Notes:  (Nursing/MHT/Case Management/Adjunct)  Date:  04/04/2019  Time:  2130  Type of Therapy:  wrap up group\  Participation Level:  Active  Participation Quality:  Appropriate, Attentive, Sharing and Supportive  Affect:  Appropriate  Cognitive:  Appropriate  Insight:  Improving  Engagement in Group:  Engaged  Modes of Intervention:  Clarification, Education and Support  Summary of Progress/Problems:  Shellia Cleverly 04/04/2019, 10:43 PM
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
## 2019-04-04 NOTE — Progress Notes (Signed)
Pt presented with a flat affect and depressed mood. Pt denied SI/HI. Pt reported decreased depression and anxiety today. Pt denied having any withdrawal symptoms. The pt voiced concerns about not having a place to stay when he is discharged and that he's too old to continue living in the woods. Pt compliant with taking his meds and denies any side effects.   Medications reviewed with pt. Verbal support provided. Pt encouraged to attend groups. 15 minute checks performed for safety.  Pt compliant with tx plan.

## 2019-04-05 NOTE — Progress Notes (Signed)
Adult Psychoeducational Group Note  Date:  04/05/2019 Time:  9:44 AM  Group Topic/Focus:  Goals Group:   The focus of this group is to help patients establish daily goals to achieve during treatment and discuss how the patient can incorporate goal setting into their daily lives to aide in recovery.  Participation Level:  Active  Participation Quality:  Appropriate, Attentive, Sharing and Supportive  Affect:  Appropriate  Cognitive:  Alert and Oriented  Insight: Improving  Engagement in Group:  Engaged  Modes of Intervention:  Discussion, Education and Exploration  Additional Comments:  Murat's goal is to reconnect with his elderly mother and his sisters in efforts to rebuild his support system.   Aalani Aikens R Kenasia Scheller 04/05/2019, 9:44 AM

## 2019-04-05 NOTE — [facility] Group Notes (Signed)
[facility] LCSW Group Therapy Note   Date and Time: 04/05/2019 @ 10am  Type of Therapy and Topic:  Group Therapy:  Trust and Honesty    Participation Level:  [facility] PARTICIPATION LEVEL: Active   Mood: Pleasant    Description of Group:     In this group patients will be asked to explore the value of being honest.  Patients will be guided to discuss their thoughts, feelings, and behaviors related to honesty and trusting in others. Patients will process together how trust and honesty relate to forming relationships with peers, family members, and self. Each patient will be challenged to identify and express feelings of being vulnerable. Patients will discuss reasons why people are dishonest and identify alternative outcomes if one was truthful (to self or others). This group will be process-oriented, with patients participating in exploration of their own experiences, giving and receiving support, and processing challenge from other group members.     Therapeutic Goals:  1.  Patient will identify why honesty is important to relationships and how honesty overall affects relationships.  2.  Patient will identify a situation where they lied or were lied too and the  feelings, thought process, and behaviors surrounding the situation  3.  Patient will identify the meaning of being vulnerable, how that feels, and how that correlates to being honest with self and others.  4.  Patient will identify situations where they could have told the truth, but instead lied and explain reasons of dishonesty.     Summary of Patient Progress:       Patient was active and engaged throughout group therapy today. Patient was able to identify why honesty is important to relationships and why he has to have trust in relationships or otherwise he cannot have the relationship. Patient discussed that he often lies because he does not want to hurt others feelings. Patient discussed a situation where he was vulnerable and  he wasn't heard and how that made him feel and the steps he took after.        Therapeutic Modalities:    Cognitive Behavioral Therapy  Solution Focused Therapy  Motivational Interviewing  Brief Therapy   Ardelle Anton, MSW, LCSW

## 2019-04-05 NOTE — Progress Notes (Signed)
[facility] MD Progress Note  04/05/2019 10:33 AM Samuel Reyes  MRN:  [id number] Subjective:  "I'm up and down."  Mr. Frischman found sitting in the dayroom. He reports mood has been "up and down" but overall improved from admission. He denies SI. He denies withdrawal symptoms and feels current medication regimen has been helpful. He denies medication side effects. Silvadene has been helpful for his sunburn. He reports good sleep and appetite. His primary concern at this time is housing after discharge. He has been sleeping in the woods recently and is worried about relapse after discharge. He does appear future-oriented, with desire to return to [place] and reconnect with his daughter and grandchildren after discharge.  From admission H&P: 63 year old male, currently homeless. History of alcohol dependence. Reports he had been drinking up to a case of beer per day up to admission.Denies drug abuse .  Principal Problem: MDD (major depressive disorder), recurrent episode, severe ([place]) Diagnosis: Principal Problem:   MDD (major depressive disorder), recurrent episode, severe ([place])  Total Time spent with patient: 20 minutes  Past Psychiatric History: See admission H&P  Past Medical History:  Past Medical History:  Diagnosis Date  . Alcoholism ([place])   . Barrett's esophagus   . CAD (coronary artery disease) 2002   MI, no intervention required  . COPD (chronic obstructive pulmonary disease) (HCC)    not on home O2  . Depression   . DTs (delirium tremens) ([place])   . GERD (gastroesophageal reflux disease)   . Headache   . Homeless   . Hypertension   . Stroke [facility]) 2006    Past Surgical History:  Procedure Laterality Date  . BACK SURGERY     3 cervical spine surgeries C4-C5 fused  . COLONOSCOPY N/A 01/04/2014   Procedure: COLONOSCOPY;  Surgeon: Danie Binder, MD;  Location: [facility] ENDO SUITE;  Service: Endoscopy;  Laterality: N/A;  1:45  . ESOPHAGOGASTRODUODENOSCOPY N/A 01/04/2014   Procedure:  ESOPHAGOGASTRODUODENOSCOPY (EGD);  Surgeon: Danie Binder, MD;  Location: [facility] ENDO SUITE;  Service: Endoscopy;  Laterality: N/A;  . FINGER SURGERY Left    2nd, 3rd, & 4th fingers were cut off by table saw and reattached  . GASTRECTOMY    . HERNIA REPAIR    . INCISIONAL HERNIA REPAIR N/A 01/20/2014   Procedure: LAPAROSCOPIC RECURRENT  INCISIONAL HERNIA with mesh;  Surgeon: Edward Jolly, MD;  Location: [facility] ORS;  Service: General;  Laterality: N/A;  . rt knee arthroscopic surgery    . SHOULDER SURGERY Bilateral    3 surgeries on on left, 2 surgeries on right    Family History:  Family History  Problem Relation Age of Onset  . Cancer Father        bone  . Cancer Brother        lungs  . Stroke Maternal Grandmother   . Asthma Son        died at age 54 in his sleep   . Spina bifida Son        died at age 73   . Dementia Mother   . Colon cancer Neg Hx    Family Psychiatric  History: See admission H&P Social History:  Social History   Substance and Sexual Activity  Alcohol Use Yes  . Alcohol/week: 15.0 standard drinks  . Types: 15 Cans of beer per week   Comment: not drink for 8 weeks     Social History   Substance and Sexual Activity  Drug Use No  Comment: denied using any drugs    Social History   Socioeconomic History  . Marital status: Widowed    Spouse name: Not on file  . Number of children: 3  . Years of education: Not on file  . Highest education level: Not on file  Occupational History  . Occupation: Disability  Social Needs  . Financial resource strain: Not on file  . Food insecurity    Worry: Not on file    Inability: Not on file  . Transportation needs    Medical: Yes    Non-medical: Yes  Tobacco Use  . Smoking status: Current Every Day Smoker    Packs/day: 1.00    Years: 52.00    Pack years: 52.00    Types: Cigarettes    Start date: 07/30/1966  . Smokeless tobacco: Never Used  . Tobacco comment: uses nicotine patch  Substance and Sexual  Activity  . Alcohol use: Yes    Alcohol/week: 15.0 standard drinks    Types: 15 Cans of beer per week    Comment: not drink for 8 weeks  . Drug use: No    Comment: denied using any drugs  . Sexual activity: Not Currently  Lifestyle  . Physical activity    Days per week: Not on file    Minutes per session: Not on file  . Stress: Not on file  Relationships  . Social Herbalist on phone: Not on file    Gets together: Not on file    Attends religious service: Not on file    Active member of club or organization: Not on file    Attends meetings of clubs or organizations: Not on file    Relationship status: Not on file  Other Topics Concern  . Not on file  Social History Narrative  . Not on file   Additional Social History:                         Sleep: Good  Appetite:  Good  Current Medications: Current Facility-Administered Medications  Medication Dose Route Frequency Provider Last Rate Last Dose  . acetaminophen (TYLENOL) tablet 650 mg  650 mg Oral Q6H PRN Suella Broad, FNP   650 mg at 04/04/19 1207  . albuterol (VENTOLIN HFA) 108 (90 Base) MCG/ACT inhaler 2 puff  2 puff Inhalation Q6H PRN Cobos, Myer Peer, MD      . alum & mag hydroxide-simeth (MAALOX/MYLANTA) 200-200-20 MG/5ML suspension 30 mL  30 mL Oral Q4H PRN Starkes-Perry, Gayland Curry, FNP      . gabapentin (NEURONTIN) capsule 300 mg  300 mg Oral TID Cobos, Myer Peer, MD   300 mg at 04/05/19 [id number]  . magnesium hydroxide (MILK OF MAGNESIA) suspension 30 mL  30 mL Oral Daily PRN Suella Broad, FNP      . multivitamin with minerals tablet 1 tablet  1 tablet Oral Daily Cobos, Myer Peer, MD   1 tablet at 04/05/19 0826  . nicotine (NICODERM CQ - dosed in mg/24 hours) patch 21 mg  21 mg Transdermal Daily Cobos, Myer Peer, MD   21 mg at 04/05/19 0826  . pantoprazole (PROTONIX) EC tablet 40 mg  40 mg Oral BID Cobos, Myer Peer, MD   40 mg at 04/05/19 0828  . sertraline (ZOLOFT) tablet 50  mg  50 mg Oral Daily Cobos, Myer Peer, MD   50 mg at 04/05/19 0826  . silver sulfADIAZINE (SILVADENE) 1 %  cream   Topical BID Cobos, Fernando A, MD      . thiamine (VITAMIN B-1) tablet 100 mg  100 mg Oral Daily Cobos, Myer Peer, MD   100 mg at 04/05/19 0826  . traMADol (ULTRAM) tablet 50 mg  50 mg Oral Q6H PRN Cobos, Myer Peer, MD   50 mg at 04/05/19 0608  . traZODone (DESYREL) tablet 100 mg  100 mg Oral QHS PRN Cobos, Myer Peer, MD   100 mg at 04/04/19 2223    Lab Results: No results found for this or any previous visit (from the past 71 hour(s)).  Blood Alcohol level:  Lab Results  Component Value Date   ETH 213 (H) 03/13/2019   ETH 42 (H) [id number]    Metabolic Disorder Labs: Lab Results  Component Value Date   HGBA1C 5.5 04/01/2019   MPG 111.15 04/01/2019   MPG 108 06/26/2017   No results found for: PROLACTIN Lab Results  Component Value Date   CHOL 208 (H) 04/01/2019   TRIG 70 04/01/2019   HDL 60 04/01/2019   CHOLHDL 3.5 04/01/2019   VLDL 14 04/01/2019   LDLCALC 134 (H) 04/01/2019   LDLCALC 46 03/18/2019    Physical Findings: AIMS: Facial and Oral Movements Muscles of Facial Expression: None, normal Lips and Perioral Area: None, normal Jaw: None, normal Tongue: None, normal,Extremity Movements Upper (arms, wrists, hands, fingers): None, normal Lower (legs, knees, ankles, toes): None, normal, Trunk Movements Neck, shoulders, hips: None, normal, Overall Severity Severity of abnormal movements (highest score from questions above): None, normal Incapacitation due to abnormal movements: None, normal Patient's awareness of abnormal movements (rate only patient's report): No Awareness, Dental Status Current problems with teeth and/or dentures?: No Does patient usually wear dentures?: No  CIWA:  CIWA-Ar Total: 0 COWS:  COWS Total Score: 3  Musculoskeletal: Strength & Muscle Tone: within normal limits Gait & Station: normal Patient leans: N/A  Psychiatric  Specialty Exam: Physical Exam  Nursing note and vitals reviewed. Constitutional: He is oriented to person, place, and time. He appears well-developed and well-nourished.  Cardiovascular: Normal rate.  Respiratory: Effort normal.  Neurological: He is alert and oriented to person, place, and time.    Review of Systems  Constitutional: Negative.   Respiratory: Negative for cough and shortness of breath.   Cardiovascular: Negative for chest pain.  Gastrointestinal: Negative for abdominal pain, nausea and vomiting.  Neurological: Negative for tremors, sensory change and headaches.  Psychiatric/Behavioral: Positive for depression and substance abuse. Negative for hallucinations and suicidal ideas. The patient is not nervous/anxious and does not have insomnia.     Blood pressure 104/67, pulse (!) 109, temperature 97.7 F (36.5 C), temperature source Oral, resp. rate 18, height 5\' 4"  (1.626 m), weight 67.6 kg, SpO2 98 %.Body mass index is 25.58 kg/m.  General Appearance: Casual  Eye Contact:  Good  Speech:  Normal Rate  Volume:  Normal  Mood:  Anxious  Affect:  Congruent  Thought Process:  Coherent  Orientation:  Full (Time, Place, and Person)  Thought Content:  Logical  Suicidal Thoughts:  No  Homicidal Thoughts:  No  Memory:  Immediate;   Good Recent;   Good Remote;   Good  Judgement:  Intact  Insight:  Fair  Psychomotor Activity:  Normal  Concentration:  Concentration: Good and Attention Span: Good  Recall:  Good  Fund of Knowledge:  Fair  Language:  Good  Akathisia:  No  Handed:  Right  AIMS (if indicated):  Assets:  Communication Skills Desire for Improvement Leisure Time Resilience  ADL's:  Intact  Cognition:  WNL  Sleep:  Number of Hours: 5     Treatment Plan Summary: Daily contact with patient to assess and evaluate symptoms and progress in treatment and Medication management   Continue inpatient hospitalization.  Continue Zoloft 50 mg PO daily for  depression/anxiety Continue Neurontin 300 mg PO TID for pain/anxiety Continue Protonix 40 mg PO BID for gastroprotection Continue Silvadene cream BID topically for sunburn Continue tramadol 50 mg PO Q6HR PRN pain Continue thiamine 50 mg PO daily for supplementation Continue trazodone 100 mg PO QHS PRN insomnia  Patient will participate in the therapeutic group milieu.  Discharge disposition in progress.   Connye Burkitt, NP 04/05/2019, 10:33 AM

## 2019-04-05 NOTE — Progress Notes (Signed)
[place] Group Notes:  (Nursing/MHT/Case Management/Adjunct)  Date:  04/05/2019  Time:  2030  Type of Therapy:  wrap up group  Participation Level:  Active  Participation Quality:  Appropriate, Attentive, Sharing and Supportive  Affect:  Appropriate  Cognitive:  Appropriate  Insight:  Improving  Engagement in Group:  Engaged  Modes of Intervention:  Clarification, Education and Support  Summary of Progress/Problems:  Samuel Reyes 04/05/2019, 9:25 PM

## 2019-04-05 NOTE — Progress Notes (Signed)
Pt presented with a flat affect and depressed mood. Pt denied SI/HI. Pt reported decreased depression and anxiety today. Pt denied having any withdrawal symptoms. The pt voiced concerns about not having a place to stay when he is discharged and that he's too old to continue living in the woods. Pt compliant with taking his meds and denied any side effects.   Medications reviewed with pt. Verbal support provided. 15 minute checks performed for safety.  Pt compliant with tx plan.

## 2019-04-06 MED ORDER — SERTRALINE HCL 100 MG PO TABS: 100.0000 mg | ORAL_TABLET | Freq: Every day | ORAL | 1 refills | Status: DC

## 2019-04-06 NOTE — Progress Notes (Signed)
  [facility] Adult Case Management Discharge Plan :  Will you be returning to the same living situation after discharge:  No.; Patient is going to rent a room out of another pt's home. Patient is going home with him.  At discharge, do you have transportation home?: Yes,  [employer] at [place] you have the ability to pay for your medications: Yes,  medicaid  Release of information consent forms completed and in the chart;  Patient's signature needed at discharge.  Patient to Follow up at: Follow-up Information    [facility] Follow up on 04/09/2019.   Why: Hospital discharge appointment is Thursday, 9/10 at 10:30a.  The provider will contact you day of appointment.  Contact information: [place] [phone] [place] [facility] Follow up on 04/21/2019.   Why: Follow up appointment is Tuesday, 9/22 t 10:50a.  Please bring your photo ID and current medications.  Contact information: [address] [place] [phone]          Next level of care provider has access to [place] and Suicide Prevention discussed: Yes,  with pt     Has patient been referred to the Quitline?: Patient refused referral  Patient has been referred for addiction treatment: Yes  Trecia Rogers, LCSW 04/06/2019, 9:42 AM

## 2019-04-06 NOTE — Progress Notes (Signed)
Recreation Therapy Notes  Date:  9.7.20 Time: 0930 Location: [address] Dayroom  Group Topic: Stress Management  Goal Area(s) Addresses:  Patient will identify positive stress management techniques. Patient will identify benefits of using stress management post d/c.  Intervention:  Stress Management  Activity :  Meditation.  LRT introduced the stress management technique of meditation.  LRT played a meditation that focused on making the most of your day and possibilities the day holds.  Education:  Stress Management, Discharge Planning.   Education Outcome: Acknowledges Education  Clinical Observations/Feedback:  Pt did not attend group.    [name], LRT/CTRS         Ria Comment, Mylon Mabey A 04/06/2019 10:50 AM

## 2019-04-06 NOTE — Tx Team (Signed)
Interdisciplinary Treatment and Diagnostic Plan Update  04/06/2019 Time of Session: 9:00am Samuel Reyes MRN: [id number]  Principal Diagnosis: MDD (major depressive disorder), recurrent episode, severe ([place])  Secondary Diagnoses: Principal Problem:   MDD (major depressive disorder), recurrent episode, severe ([place])   Current Medications:  Current Facility-Administered Medications  Medication Dose Route Frequency Provider Last Rate Last Dose  . acetaminophen (TYLENOL) tablet 650 mg  650 mg Oral Q6H PRN Suella Broad, FNP   650 mg at 04/05/19 1207  . albuterol (VENTOLIN HFA) 108 (90 Base) MCG/ACT inhaler 2 puff  2 puff Inhalation Q6H PRN Cobos, Myer Peer, MD      . alum & mag hydroxide-simeth (MAALOX/MYLANTA) 200-200-20 MG/5ML suspension 30 mL  30 mL Oral Q4H PRN Starkes-Perry, Gayland Curry, FNP      . gabapentin (NEURONTIN) capsule 300 mg  300 mg Oral TID Cobos, Myer Peer, MD   300 mg at 04/06/19 0750  . magnesium hydroxide (MILK OF MAGNESIA) suspension 30 mL  30 mL Oral Daily PRN Suella Broad, FNP      . multivitamin with minerals tablet 1 tablet  1 tablet Oral Daily Cobos, Myer Peer, MD   1 tablet at 04/06/19 0750  . nicotine (NICODERM CQ - dosed in mg/24 hours) patch 21 mg  21 mg Transdermal Daily Cobos, Myer Peer, MD   21 mg at 04/06/19 0750  . pantoprazole (PROTONIX) EC tablet 40 mg  40 mg Oral BID Cobos, Myer Peer, MD   40 mg at 04/06/19 0752  . sertraline (ZOLOFT) tablet 50 mg  50 mg Oral Daily Cobos, Myer Peer, MD   50 mg at 04/06/19 0750  . silver sulfADIAZINE (SILVADENE) 1 % cream   Topical BID Cobos, Myer Peer, MD   1 application at [id number]  . thiamine (VITAMIN B-1) tablet 100 mg  100 mg Oral Daily Cobos, Myer Peer, MD   100 mg at 04/06/19 0750  . traMADol (ULTRAM) tablet 50 mg  50 mg Oral Q6H PRN Cobos, Myer Peer, MD   50 mg at 04/06/19 0756  . traZODone (DESYREL) tablet 100 mg  100 mg Oral QHS PRN Cobos, Myer Peer, MD   100 mg at 04/05/19 2225    PTA Medications: Medications Prior to Admission  Medication Sig Dispense Refill Last Dose  . albuterol (VENTOLIN HFA) 108 (90 Base) MCG/ACT inhaler Inhale 2 puffs into the lungs every 4 (four) hours as needed for wheezing or shortness of breath. 18 g 1   . [EXPIRED] cephALEXin (KEFLEX) 500 MG capsule Take 1 capsule (500 mg total) by mouth every 8 (eight) hours for 3 days. 9 capsule 0   . gabapentin (NEURONTIN) 300 MG capsule Take 1 capsule (300 mg total) by mouth 3 (three) times daily.     . pantoprazole (PROTONIX) 40 MG tablet Take 1 tablet (40 mg total) by mouth 2 (two) times daily. For reflux 60 tablet 1   . sertraline (ZOLOFT) 50 MG tablet Take 1 tablet (50 mg total) by mouth daily. For mood 30 tablet 1   . traZODone (DESYREL) 100 MG tablet Take 1 tablet (100 mg total) by mouth at bedtime and may repeat dose one time if needed. (Patient taking differently: Take 150 mg by mouth at bedtime and may repeat dose one time if needed. )       Patient Stressors: Financial difficulties Health problems Substance abuse  Patient Strengths: Ability for insight Communication skills  Treatment Modalities: Medication Management, Group therapy, Case management,  1 to  1 session with clinician, Psychoeducation, Recreational therapy.   Physician Treatment Plan for Primary Diagnosis: MDD (major depressive disorder), recurrent episode, severe ([place]) Long Term Goal(s): Improvement in symptoms so as ready for discharge Improvement in symptoms so as ready for discharge   Short Term Goals: Ability to identify changes in lifestyle to reduce recurrence of condition will improve Ability to identify triggers associated with substance abuse/mental health issues will improve Ability to identify changes in lifestyle to reduce recurrence of condition will improve Ability to verbalize feelings will improve Ability to disclose and discuss suicidal ideas Ability to demonstrate self-control will improve Ability to  identify and develop effective coping behaviors will improve Ability to maintain clinical measurements within normal limits will improve  Medication Management: Evaluate patient's response, side effects, and tolerance of medication regimen.  Therapeutic Interventions: 1 to 1 sessions, Unit Group sessions and Medication administration.  Evaluation of Outcomes: Adequate for Discharge  Physician Treatment Plan for Secondary Diagnosis: Principal Problem:   MDD (major depressive disorder), recurrent episode, severe ([place])  Long Term Goal(s): Improvement in symptoms so as ready for discharge Improvement in symptoms so as ready for discharge   Short Term Goals: Ability to identify changes in lifestyle to reduce recurrence of condition will improve Ability to identify triggers associated with substance abuse/mental health issues will improve Ability to identify changes in lifestyle to reduce recurrence of condition will improve Ability to verbalize feelings will improve Ability to disclose and discuss suicidal ideas Ability to demonstrate self-control will improve Ability to identify and develop effective coping behaviors will improve Ability to maintain clinical measurements within normal limits will improve     Medication Management: Evaluate patient's response, side effects, and tolerance of medication regimen.  Therapeutic Interventions: 1 to 1 sessions, Unit Group sessions and Medication administration.  Evaluation of Outcomes: Adequate for Discharge   RN Treatment Plan for Primary Diagnosis: MDD (major depressive disorder), recurrent episode, severe ([place]) Long Term Goal(s): Knowledge of disease and therapeutic regimen to maintain health will improve  Short Term Goals: Ability to demonstrate self-control, Ability to verbalize feelings will improve, Ability to identify and develop effective coping behaviors will improve and Compliance with prescribed medications will  improve  Medication Management: RN will administer medications as ordered by provider, will assess and evaluate patient's response and provide education to patient for prescribed medication. RN will report any adverse and/or side effects to prescribing provider.  Therapeutic Interventions: 1 on 1 counseling sessions, Psychoeducation, Medication administration, Evaluate responses to treatment, Monitor vital signs and CBGs as ordered, Perform/monitor CIWA, COWS, AIMS and Fall Risk screenings as ordered, Perform wound care treatments as ordered.  Evaluation of Outcomes: Adequate for Discharge   LCSW Treatment Plan for Primary Diagnosis: MDD (major depressive disorder), recurrent episode, severe ([place]) Long Term Goal(s): Safe transition to appropriate next level of care at discharge, Engage patient in therapeutic group addressing interpersonal concerns.  Short Term Goals: Engage patient in aftercare planning with referrals and resources, Increase social support, Identify triggers associated with mental health/substance abuse issues and Increase skills for wellness and recovery  Therapeutic Interventions: Assess for all discharge needs, 1 to 1 time with Social worker, Explore available resources and support systems, Assess for adequacy in community support network, Educate family and significant other(s) on suicide prevention, Complete Psychosocial Assessment, Interpersonal group therapy.  Evaluation of Outcomes: Adequate for Discharge   Progress in Treatment: Attending groups: Yes. Participating in groups: Yes. Taking medication as prescribed: Yes. Toleration medication: Yes. Family/Significant other contact made: No,  will contact:  SPE reviewed with patient. Patient understands diagnosis: Yes. Discussing patient identified problems/goals with staff: Yes. Medical problems stabilized or resolved: No. Denies suicidal/homicidal ideation: No. Issues/concerns per patient self-inventory:  Yes.  New problem(s) identified: Yes, Describe:  homelessness, limited income, limited supports  New Short Term/Long Term Goal(s): detox, medication management for mood stabilization; elimination of SI thoughts; development of comprehensive mental wellness/sobriety plan.  Patient Goals: "Get this coconut straightened out."  Discharge Plan or Barriers: Renting a room in a house, provided other housing resources. Outpatient follow up for medication management and therapy.   Reason for Continuation of Hospitalization: Anxiety Depression Suicidal ideation  Estimated Length of Stay: discharge today.  Attendees: Patient: Samuel Reyes 04/06/2019 2:04 PM  Physician: Larene Beach Dr.Farah 04/06/2019 2:04 PM  Nursing: Legrand Como, RN 04/06/2019 2:04 PM  RN Care Manager: 04/06/2019 2:04 PM  Social Worker: Stephanie Acre, [place] 04/06/2019 2:04 PM  Recreational Therapist:  04/06/2019 2:04 PM  Other: Harriett Sine, NP 04/06/2019 2:04 PM  Other:  04/06/2019 2:04 PM  Other: 04/06/2019 2:04 PM    Scribe for Treatment Team: Joellen [place], [place] 04/06/2019 2:04 PM

## 2019-04-06 NOTE — Plan of Care (Signed)
Discharge note  Patient verbalizes readiness for discharge. Follow up plan explained, AVS, Transition record and SRA given. Prescriptions and teaching provided. Belongings returned and signed for. Suicide safety plan completed and signed. Patient verbalizes understanding. Patient denies SI/HI and assures this Probation officer he will seek assistance should that change. Patient discharged to lobby where pt was instructed to wait for their lift.  Problem: Education: Goal: Knowledge of  General Education information/materials will improve Outcome: Adequate for Discharge Goal: Emotional status will improve Outcome: Adequate for Discharge Goal: Mental status will improve Outcome: Adequate for Discharge Goal: Verbalization of understanding the information provided will improve Outcome: Adequate for Discharge   Problem: Medication: Goal: Compliance with prescribed medication regimen will improve Outcome: Adequate for Discharge   Problem: Self-Concept: Goal: Ability to disclose and discuss suicidal ideas will improve Outcome: Adequate for Discharge Goal: Will verbalize positive feelings about self Outcome: Adequate for Discharge   Problem: Activity: Goal: Interest or engagement in leisure activities will improve Outcome: Adequate for Discharge Goal: Imbalance in normal sleep/wake cycle will improve Outcome: Adequate for Discharge   Problem: Coping: Goal: Coping ability will improve Outcome: Adequate for Discharge Goal: Will verbalize feelings Outcome: Adequate for Discharge

## 2019-04-06 NOTE — Discharge Summary (Signed)
Physician Discharge Summary Note  Patient:  Samuel Reyes is an 63 y.o., male MRN:  [id number] DOB:  Jul 01, 1956 Patient phone:  [phone] (home)  Patient address:   [place] [address],  Total Time spent with patient: 15 minutes  Date of Admission:  03/31/2019 Date of Discharge: 04/06/19  Reason for Admission:  Alcohol dependence with suicidal ideation  Principal Problem: MDD (major depressive disorder), recurrent episode, severe ([place]) Discharge Diagnoses: Principal Problem:   MDD (major depressive disorder), recurrent episode, severe ([place])   Past Psychiatric History: Patient has a history of prior psychiatric admissions, most recently here at [facility] in March/2020 at which time he presented for suicidal ideations , alcohol use disorder. At the time was diagnosed with MDD and Alcohol Use Disorder, discharged on Zoloft, Trazodone, Minipress. History of suicide attempt by overdosing and by cutting wrist 12 years ago. Denies history of psychosis.  Past Medical History:  Past Medical History:  Diagnosis Date  . Alcoholism ([place])   . Barrett's esophagus   . CAD (coronary artery disease) 2002   MI, no intervention required  . COPD (chronic obstructive pulmonary disease) (HCC)    not on home O2  . Depression   . DTs (delirium tremens) ([place])   . GERD (gastroesophageal reflux disease)   . Headache   . Homeless   . Hypertension   . Stroke [facility]) 2006    Past Surgical History:  Procedure Laterality Date  . BACK SURGERY     3 cervical spine surgeries C4-C5 fused  . COLONOSCOPY N/A 01/04/2014   Procedure: COLONOSCOPY;  Surgeon: Danie Binder, MD;  Location: [facility] ENDO [facility];  Service: Endoscopy;  Laterality: N/A;  1:45  . ESOPHAGOGASTRODUODENOSCOPY N/A 01/04/2014   Procedure: ESOPHAGOGASTRODUODENOSCOPY (EGD);  Surgeon: Danie Binder, MD;  Location: [facility];  Service: Endoscopy;  Laterality: N/A;  . FINGER SURGERY Left    2nd, 3rd, & 4th fingers were cut off by  table saw and reattached  . GASTRECTOMY    . HERNIA REPAIR    . INCISIONAL HERNIA REPAIR N/A 01/20/2014   Procedure: LAPAROSCOPIC RECURRENT  INCISIONAL HERNIA with mesh;  Surgeon: Edward Jolly, MD;  Location: [facility] ORS;  Service: General;  Laterality: N/A;  . rt knee arthroscopic surgery    . SHOULDER SURGERY Bilateral    3 surgeries on on left, 2 surgeries on right    Family History:  Family History  Problem Relation Age of Onset  . Cancer Father        bone  . Cancer Brother        lungs  . Stroke Maternal Grandmother   . Asthma Son        died at age 64 in his sleep   . Spina bifida Son        died at age 67   . Dementia Mother   . Colon cancer Neg Hx    Family Psychiatric  History: reports sister has history of depression and alcohol abuse , a half sister committed suicide .  Social History:  Social History   Substance and Sexual Activity  Alcohol Use Yes  . Alcohol/week: 15.0 standard drinks  . Types: 15 Cans of beer per week   Comment: not drink for 8 weeks     Social History   Substance and Sexual Activity  Drug Use No   Comment: denied using any drugs    Social History   Socioeconomic History  . Marital status: Widowed  Spouse name: Not on file  . Number of children: 3  . Years of education: Not on file  . Highest education level: Not on file  Occupational History  . Occupation: Disability  Social Needs  . Financial resource strain: Not on file  . Food insecurity    Worry: Not on file    Inability: Not on file  . Transportation needs    Medical: Yes    Non-medical: Yes  Tobacco Use  . Smoking status: Current Every Day Smoker    Packs/day: 1.00    Years: 52.00    Pack years: 52.00    Types: Cigarettes    Start date: 07/30/1966  . Smokeless tobacco: Never Used  . Tobacco comment: uses nicotine patch  Substance and Sexual Activity  . Alcohol use: Yes    Alcohol/week: 15.0 standard drinks    Types: 15 Cans of beer per week    Comment: not  drink for 8 weeks  . Drug use: No    Comment: denied using any drugs  . Sexual activity: Not Currently  Lifestyle  . Physical activity    Days per week: Not on file    Minutes per session: Not on file  . Stress: Not on file  Relationships  . Social Herbalist on phone: Not on file    Gets together: Not on file    Attends religious service: Not on file    Active member of club or organization: Not on file    Attends meetings of clubs or organizations: Not on file    Relationship status: Not on file  Other Topics Concern  . Not on file  Social History Narrative  . Not on file    Hospital Course:  From admission H&P: 63 year old male, currently homeless. History of alcohol dependence. Recent admission to medical unit at [facility] from 8/14- 8/20 for fall , hematemesis, chest pain, severe hyponatremia. Patient returned to ED on 8/25 with second degree sunburns , abdominal pain , nausea, hyponatremia. Reports he had been drinking up to a case of beer per day up to admission. Denies drug abuse. Was initially admitted to medical unit for medical management and stabilization. He was seen by Dr. Mariea Clonts for psychiatric consultation for depression. During consultation patient reported suicidal ideations, with thoughts of jumping in front of traffic . Inpatient psychiatric admission on medical clearance was recommended. Patient attributes depression in part to alcohol and also to loss of loved ones in the past- an adult son died from complications of asthma several years ago, wife died 3 years ago from complications of Chron's Disease. Major stressors include homelessness, limited support network. Endorses neuro-vegetative symptoms as below. Denies psychotic symptoms.   Samuel Reyes was admitted for alcohol dependence with suicidal ideation. He remained on the [facility] unit for six days. He was started on CIWA protocol with Ativan PRN CIWA>10 for alcohol withdrawal. Zoloft and Neurontin were started. He  participated in group therapy on the unit. He responded well to treatment with no adverse effects reported. He has shown improved mood, affect, sleep, and interaction. He denies any SI/HI/AVH and contracts for safety. He denies withdrawal symptoms. He is discharging on the medications listed below. He agrees to follow up at [facility] (see below). He is provided with prescriptions and medication samples upon discharge. He is discharging home with a friend via [facility].  Physical Findings: AIMS: Facial and Oral Movements Muscles of Facial Expression: None, normal Lips  and Perioral Area: None, normal Jaw: None, normal Tongue: None, normal,Extremity Movements Upper (arms, wrists, hands, fingers): None, normal Lower (legs, knees, ankles, toes): None, normal, Trunk Movements Neck, shoulders, hips: None, normal, Overall Severity Severity of abnormal movements (highest score from questions above): None, normal Incapacitation due to abnormal movements: None, normal Patient's awareness of abnormal movements (rate only patient's report): No Awareness, Dental Status Current problems with teeth and/or dentures?: No Does patient usually wear dentures?: No  CIWA:  CIWA-Ar Total: 0 COWS:  COWS Total Score: 3  Musculoskeletal: Strength & Muscle Tone: within normal limits Gait & Station: normal Patient leans: N/A  Psychiatric Specialty Exam: Physical Exam  Nursing note and vitals reviewed. Constitutional: He is oriented to person, place, and time. He appears well-developed and well-nourished.  Cardiovascular: Normal rate.  Respiratory: Effort normal.  Neurological: He is alert and oriented to person, place, and time.    Review of Systems  Constitutional: Negative.   Respiratory: Negative for cough and shortness of breath.   Cardiovascular: Negative for chest pain.  Gastrointestinal: Negative for abdominal pain, diarrhea, nausea and vomiting.  Neurological: Negative for  tremors, sensory change and headaches.  Psychiatric/Behavioral: Positive for depression (stable on medication) and substance abuse. Negative for hallucinations and suicidal ideas. The patient is not nervous/anxious and does not have insomnia.     Blood pressure 107/71, pulse 94, temperature 98.1 F (36.7 C), temperature source Oral, resp. rate 20, height 5\' 4"  (1.626 m), weight 67.6 kg, SpO2 98 %.Body mass index is 25.58 kg/m.  See MD's discharge SRA      Has this patient used any form of tobacco in the last 30 days? (Cigarettes, Smokeless Tobacco, Cigars, and/or Pipes)  Yes, A prescription for an FDA-approved tobacco cessation medication was offered at discharge and the patient refused  Blood Alcohol level:  Lab Results  Component Value Date   ETH 213 (H) 03/13/2019   ETH 42 (H) [id number]    Metabolic Disorder Labs:  Lab Results  Component Value Date   HGBA1C 5.5 04/01/2019   MPG 111.15 04/01/2019   MPG 108 06/26/2017   No results found for: PROLACTIN Lab Results  Component Value Date   CHOL 208 (H) 04/01/2019   TRIG 70 04/01/2019   HDL 60 04/01/2019   CHOLHDL 3.5 04/01/2019   VLDL 14 04/01/2019   LDLCALC 134 (H) 04/01/2019   LDLCALC 46 03/18/2019    See Psychiatric Specialty Exam and Suicide Risk Assessment completed by Attending Physician prior to discharge.  Discharge destination:  Home  Is patient on multiple antipsychotic therapies at discharge:  No   Has Patient had three or more failed trials of antipsychotic monotherapy by history:  No  Recommended Plan for Multiple Antipsychotic Therapies: NA   Allergies as of 04/06/2019      Reactions   Bee Venom Anaphylaxis   Penicillins Rash   Has patient had a PCN reaction causing immediate rash, facial/tongue/throat swelling, SOB or lightheadedness with hypotension: {Yes Has patient had a PCN reaction causing severe rash involving mucus membranes or skin necrosis: YES Has patient had a PCN reaction that required  hospitalization Yes Has patient had a PCN reaction occurring within the last 10 years: YES If all of the above answers are "NO", then may proceed with Cephalosporin use.   Vancomycin Tinitus      Medication List    STOP taking these medications   cephALEXin 500 MG capsule Commonly known as: KEFLEX   traZODone 100 MG tablet Commonly known as:  DESYREL     TAKE these medications     Indication  albuterol 108 (90 Base) MCG/ACT inhaler Commonly known as: VENTOLIN HFA Inhale 2 puffs into the lungs every 4 (four) hours as needed for wheezing or shortness of breath.  Indication: Chronic Obstructive Lung Disease   gabapentin 300 MG capsule Commonly known as: NEURONTIN Take 1 capsule (300 mg total) by mouth 3 (three) times daily.  Indication: Abuse or Misuse of Alcohol   pantoprazole 40 MG tablet Commonly known as: PROTONIX Take 1 tablet (40 mg total) by mouth 2 (two) times daily. For reflux  Indication: Gastroesophageal Reflux Disease   sertraline 100 MG tablet Commonly known as: ZOLOFT Take 1 tablet (100 mg total) by mouth daily. Start taking on: April 07, 2019 What changed:   medication strength  how much to take  additional instructions  Indication: Major Depressive Disorder      Follow-up Information    [facility] Follow up on 04/09/2019.   Why: Hospital discharge appointment is Thursday, 9/10 at 10:30a.  The provider will contact you day of appointment.  Contact information: [place] [phone] [place] [facility] Follow up on 04/21/2019.   Why: Follow up appointment is Tuesday, 9/22 t 10:50a.  Please bring your photo ID and current medications.  Contact information: [address] [place] [phone]          Follow-up recommendations: Activity as tolerated. Diet as recommended by primary care physician. Keep all scheduled follow-up appointments as  recommended.   Comments:   Patient is instructed to take all prescribed medications as recommended. Report any side effects or adverse reactions to your outpatient psychiatrist. Patient is instructed to abstain from alcohol and illegal drugs while on prescription medications. In the event of worsening symptoms, patient is instructed to call the crisis hotline, 911, or go to the nearest emergency department for evaluation and treatment.  Signed: Connye Burkitt, NP 04/06/2019, 10:14 AM

## 2019-04-06 NOTE — Progress Notes (Signed)
Patient ID: Samuel Reyes, male   DOB: 05/03/1956, 63 y.o.   MRN: [id number]   CSW scheduled kaizen lyft at [place] notified pt's nurse.

## 2019-04-06 NOTE — [facility] Suicide Risk Assessment (Signed)
[facility] Discharge Suicide Risk Assessment   Principal Problem: MDD (major depressive disorder), recurrent episode, severe ([place]) Discharge Diagnoses: Principal Problem:   MDD (major depressive disorder), recurrent episode, severe ([place])   Total Time spent with patient: 45 minutes  Musculoskeletal: Strength & Muscle Tone: within normal limits Gait & Station: normal Patient leans: N/A  Psychiatric Specialty Exam: ROS  Blood pressure 107/71, pulse 94, temperature 98.1 F (36.7 C), temperature source Oral, resp. rate 20, height 5\' 4"  (1.626 m), weight 67.6 kg, SpO2 98 %.Body mass index is 25.58 kg/m.  General Appearance: Casual  Eye Contact::  Good  Speech:  Clear and Coherent409  Volume:  Normal  Mood:  Euthymic  Affect:  Restricted  Thought Process:  Coherent and Descriptions of Associations: Intact  Orientation:  Full (Time, Place, and Person)  Thought Content:  Logical  Suicidal Thoughts:  No  Homicidal Thoughts:  No  Memory:  Immediate;   Fair Remote;   Fair  Judgement:  Intact  Insight:  Fair  Psychomotor Activity:  Normal  Concentration:  Good  Recall:  Good  Fund of Knowledge:Fair  Language: Good  Akathisia:  Negative  Handed:  Right  AIMS (if indicated):     Assets: [facility]/new friendship/has found housing  Sleep:  Number of Hours: 5  Cognition: WNL  ADL's:  Intact   Mental Status Per Nursing Assessment::   On Admission:  Self-harm thoughts  Demographic Factors:  Male and Caucasian  Loss Factors: Decrease in vocational status  Historical Factors: NA  Risk Reduction Factors:   Religious beliefs about death  Continued Clinical Symptoms:  Alcohol/Substance Abuse/Dependencies  Cognitive Features That Contribute To Risk:  None    Suicide Risk:  Minimal: No identifiable suicidal ideation.  Patients presenting with no risk factors but with morbid ruminations; may be classified as minimal risk based on the severity of the depressive  symptoms  Follow-up Information    [facility] Follow up on 04/09/2019.   Why: Hospital discharge appointment is Thursday, 9/10 at 10:30a.  The provider will contact you day of appointment.  Contact information: [place] [phone] [place] [facility] Follow up on 04/21/2019.   Why: Follow up appointment is Tuesday, 9/22 t 10:50a.  Please bring your photo ID and current medications.  Contact information: [place] [phone]          Plan Of Care/Follow-up recommendations:  Activity:  full  Danielly Ackerley, MD 04/06/2019, 9:03 AM

## 2019-04-21 ENCOUNTER — Ambulatory Visit: Payer: Medicaid Other | Attending: Nurse Practitioner | Admitting: Nurse Practitioner

## 2019-04-21 ENCOUNTER — Other Ambulatory Visit: Payer: Self-pay

## 2019-06-04 ENCOUNTER — Encounter (HOSPITAL_COMMUNITY): Payer: Self-pay | Admitting: Emergency Medicine

## 2019-06-04 ENCOUNTER — Emergency Department (HOSPITAL_COMMUNITY)
Admission: EM | Admit: 2019-06-04 | Discharge: 2019-06-05 | Disposition: A | Discharge status: Home / Self Care | Payer: Medicaid Other | Attending: Emergency Medicine | Admitting: Emergency Medicine

## 2019-06-04 ENCOUNTER — Emergency Department (HOSPITAL_COMMUNITY): Payer: Medicaid Other

## 2019-06-04 ENCOUNTER — Other Ambulatory Visit: Payer: Self-pay

## 2019-06-04 DIAGNOSIS — I2510 Atherosclerotic heart disease of native coronary artery without angina pectoris: Secondary | ICD-10-CM | POA: Insufficient documentation

## 2019-06-04 DIAGNOSIS — W0110XD Fall on same level from slipping, tripping and stumbling with subsequent striking against unspecified object, subsequent encounter: Secondary | ICD-10-CM | POA: Insufficient documentation

## 2019-06-04 DIAGNOSIS — F10930 Alcohol use, unspecified with withdrawal, uncomplicated: Secondary | ICD-10-CM

## 2019-06-04 DIAGNOSIS — R1013 Epigastric pain: Secondary | ICD-10-CM | POA: Insufficient documentation

## 2019-06-04 DIAGNOSIS — F10230 Alcohol dependence with withdrawal, uncomplicated: Secondary | ICD-10-CM | POA: Diagnosis not present

## 2019-06-04 DIAGNOSIS — F17210 Nicotine dependence, cigarettes, uncomplicated: Secondary | ICD-10-CM | POA: Diagnosis not present

## 2019-06-04 DIAGNOSIS — R519 Headache, unspecified: Secondary | ICD-10-CM | POA: Insufficient documentation

## 2019-06-04 DIAGNOSIS — I10 Essential (primary) hypertension: Secondary | ICD-10-CM | POA: Insufficient documentation

## 2019-06-04 DIAGNOSIS — Z79899 Other long term (current) drug therapy: Secondary | ICD-10-CM | POA: Diagnosis not present

## 2019-06-04 DIAGNOSIS — J449 Chronic obstructive pulmonary disease, unspecified: Secondary | ICD-10-CM | POA: Insufficient documentation

## 2019-06-04 DIAGNOSIS — H5710 Ocular pain, unspecified eye: Secondary | ICD-10-CM | POA: Insufficient documentation

## 2019-06-04 LAB — CBC WITH DIFFERENTIAL/PLATELET
Abs Immature Granulocytes: 0.05 10*3/uL (ref 0.00–0.07)
Basophils Absolute: 0 10*3/uL (ref 0.0–0.1)
Basophils Relative: 0 %
Eosinophils Absolute: 0 10*3/uL (ref 0.0–0.5)
Eosinophils Relative: 0 %
HCT: 48.9 % (ref 39.0–52.0)
Hemoglobin: 15.5 g/dL (ref 13.0–17.0)
Immature Granulocytes: 0 %
Lymphocytes Relative: 7 %
Lymphs Abs: 1.1 10*3/uL (ref 0.7–4.0)
MCH: 27.2 pg (ref 26.0–34.0)
MCHC: 31.7 g/dL (ref 30.0–36.0)
MCV: 85.9 fL (ref 80.0–100.0)
Monocytes Absolute: 0.7 10*3/uL (ref 0.1–1.0)
Monocytes Relative: 5 %
Neutro Abs: 13.1 10*3/uL — ABNORMAL HIGH (ref 1.7–7.7)
Neutrophils Relative %: 88 %
Platelets: 495 10*3/uL — ABNORMAL HIGH (ref 150–400)
RBC: 5.69 MIL/uL (ref 4.22–5.81)
RDW: 16.2 % — ABNORMAL HIGH (ref 11.5–15.5)
WBC: 15 10*3/uL — ABNORMAL HIGH (ref 4.0–10.5)
nRBC: 0 % (ref 0.0–0.2)

## 2019-06-04 LAB — COMPREHENSIVE METABOLIC PANEL
ALT: 16 U/L (ref 0–44)
AST: 29 U/L (ref 15–41)
Albumin: 4.5 g/dL (ref 3.5–5.0)
Alkaline Phosphatase: 93 U/L (ref 38–126)
Anion gap: 18 — ABNORMAL HIGH (ref 5–15)
BUN: 12 mg/dL (ref 8–23)
CO2: 21 mmol/L — ABNORMAL LOW (ref 22–32)
Calcium: 9 mg/dL (ref 8.9–10.3)
Chloride: 99 mmol/L (ref 98–111)
Creatinine, Ser: 1.03 mg/dL (ref 0.61–1.24)
GFR calc Af Amer: >60 mL/min (ref >60)
GFR calc non Af Amer: >60 mL/min (ref >60)
Glucose, Bld: 102 mg/dL — ABNORMAL HIGH (ref 70–99)
Potassium: 4.9 mmol/L (ref 3.5–5.1)
Sodium: 138 mmol/L (ref 135–145)
Total Bilirubin: 0.7 mg/dL (ref 0.3–1.2)
Total Protein: 8.5 g/dL — ABNORMAL HIGH (ref 6.5–8.1)

## 2019-06-04 LAB — URINALYSIS, ROUTINE W REFLEX MICROSCOPIC
Bacteria, UA: NONE SEEN
Bilirubin Urine: NEGATIVE
Glucose, UA: NEGATIVE mg/dL
Ketones, ur: 20 mg/dL — AB
Leukocytes,Ua: NEGATIVE
Nitrite: NEGATIVE
Protein, ur: NEGATIVE mg/dL
Specific Gravity, Urine: 1.019 (ref 1.005–1.030)
pH: 5 (ref 5.0–8.0)

## 2019-06-04 LAB — LIPASE, BLOOD: Lipase: 16 U/L (ref 11–51)

## 2019-06-04 MED ORDER — METOCLOPRAMIDE HCL 5 MG/ML IJ SOLN
10.0000 mg | Freq: Once | INTRAMUSCULAR | Status: AC
  Administered 2019-06-04: 10 mg via INTRAVENOUS
  Filled 2019-06-04: qty 2

## 2019-06-04 MED ORDER — SODIUM CHLORIDE 0.9 % IV BOLUS
1000.0000 mL | Freq: Once | INTRAVENOUS | Status: AC
  Administered 2019-06-04: 1000 mL via INTRAVENOUS

## 2019-06-04 MED ORDER — SODIUM CHLORIDE (PF) 0.9 % IJ SOLN
INTRAMUSCULAR | Status: AC
  Administered 2019-06-04: 22:00:00
  Filled 2019-06-04: qty 50

## 2019-06-04 MED ORDER — IOHEXOL 300 MG/ML  SOLN
100.0000 mL | Freq: Once | INTRAMUSCULAR | Status: AC | PRN
  Administered 2019-06-04: 100 mL via INTRAVENOUS

## 2019-06-04 MED ORDER — ONDANSETRON HCL 4 MG/2ML IJ SOLN
4.0000 mg | Freq: Once | INTRAMUSCULAR | Status: AC
  Administered 2019-06-04: 4 mg via INTRAVENOUS
  Filled 2019-06-04: qty 2

## 2019-06-04 MED ORDER — PROMETHAZINE HCL 25 MG PO TABS: 25.0000 mg | ORAL_TABLET | Freq: Four times a day (QID) | ORAL | 0 refills | Status: DC | PRN

## 2019-06-04 MED ORDER — KETOROLAC TROMETHAMINE 15 MG/ML IJ SOLN
15.0000 mg | Freq: Once | INTRAMUSCULAR | Status: AC
  Administered 2019-06-04: 15 mg via INTRAVENOUS
  Filled 2019-06-04: qty 1

## 2019-06-04 MED ORDER — FAMOTIDINE IN NACL 20-0.9 MG/50ML-% IV SOLN
20.0000 mg | Freq: Once | INTRAVENOUS | Status: AC
  Administered 2019-06-04: 20 mg via INTRAVENOUS
  Filled 2019-06-04: qty 50

## 2019-06-04 MED ORDER — LORAZEPAM 2 MG/ML IJ SOLN
2.0000 mg | Freq: Once | INTRAMUSCULAR | Status: AC
  Administered 2019-06-04: 22:00:00 2 mg via INTRAVENOUS
  Filled 2019-06-04: qty 1

## 2019-06-04 MED ORDER — TETRACAINE HCL 0.5 % OP SOLN
1.0000 [drp] | Freq: Once | OPHTHALMIC | Status: AC
  Administered 2019-06-04: 18:00:00 1 [drp] via OPHTHALMIC
  Filled 2019-06-04: qty 4

## 2019-06-04 NOTE — ED Triage Notes (Signed)
Per PTAR, patient is intoxicated-found sleeping outside of hotel-apparently fell-small laceration above right eye-eye bruised and swollen-patient complaining of blurred vision in affected eye

## 2019-06-04 NOTE — ED Provider Notes (Signed)
2230: Pt handed off from previous EDPA pending CIWA score, UA, CTAP, reassessment.  See previous note for full details.   63 yo M here with nausea, vomiting, epigastric abdominal pain, diarrhea, headache and right facial injury after fall yesterday.    H/o alcohol abuse stopped drinking yesterday. Usually has 4 40 oz beers but only has 12 beers in the last 24 hours.  Documented history of DTs, admission August 2020 for CIWA 15. He reported severe depression and psych recommended inpatient treatment after medical stabilization.  He presented with very similar symptoms at that time including fall, trauma to head, tremors, nausea, vomiting, epigastric abdominal pain.    Physical Exam  BP (!) 144/84   Pulse (!) 113   Temp 99 F (37.2 C) (Oral)   Resp 20   Ht 5\' 5"  (1.651 m)   Wt 68 kg   SpO2 99%   BMI 24.96 kg/m   Physical Exam Vitals signs and nursing note reviewed.  Constitutional:      General: He is not in acute distress.    Appearance: He is well-developed.     Comments: NAD.  HENT:     Head: Normocephalic.     Comments: Dried up blood to right eyebrow     Right Ear: External ear normal.     Left Ear: External ear normal.     Nose: Nose normal.  Eyes:     General: No scleral icterus.    Conjunctiva/sclera: Conjunctivae normal.     Comments: EOMs normal and symmetric   Neck:     Musculoskeletal: Normal range of motion and neck supple.  Cardiovascular:     Rate and Rhythm: Normal rate and regular rhythm.     Heart sounds: Normal heart sounds. No murmur.  Pulmonary:     Effort: Pulmonary effort is normal.     Breath sounds: Normal breath sounds. No wheezing.  Abdominal:     Palpations: Abdomen is soft.     Tenderness: There is abdominal tenderness.     Comments: Mild epigastric abd tenderness without G/R/R   Musculoskeletal: Normal range of motion.        General: No deformity.  Skin:    General: Skin is warm and dry.     Capillary Refill: Capillary refill takes less  than 2 seconds.  Neurological:     Mental Status: He is alert and oriented to person, place, and time.     Comments: Speech is clear. Follows commands. No tremors   Psychiatric:        Behavior: Behavior normal.        Thought Content: Thought content normal.        Judgment: Judgment normal.     ED Course/Procedures   Clinical Course as of Jun 03 2340  Thu Jun 04, 2019  [id number] Chronic alcoholic Nausea, vomiting, diarrhea, epigastric abd pain x 2 days Last drank yesterday [place] hit right side forehead - symptomatic control F/u on UA, CTAP, VS normalize, PO challenge appropriate for dc   [CG]  2226 Reevaluated patient.  Reports feeling "fine".  No repeat emesis.  IV has infiltrated, will need another IV for IV fluid.   [CG]  2239 Temp: 99 F (37.2 C) [CG]  2239 Pulse Rate(!): 130 [CG]  2239 WBC(!): 15.0 [CG]    Clinical Course User Index [CG] Kinnie Feil, PA-C    Procedures  MDM   2040: I evaluated patient.  No diaphoresis.  No tremors.  No repeat episodes of  emesis here.  Discussed benign CT A/P.  Encouraged to provide urinalysis sample.  Per RN IV has infiltrated and has not received IV fluid.  2335: Patient reevaluated again.  No clinical decline.  He is asleep but easily arousable.  No noted diaphoresis, tremors, hypertension.  His tachycardia has improved significantly.  No episodes of emesis and is tolerating sandwich and oral fluids without issues.  Ambulatory here.  No clinical signs to suggest severe withdrawal.  His initial CIWA was 16 but subsequent was 10 without any Ativan given in just IV fluid.  Had frank discussion with patient regarding nausea, vomiting, abdominal pain and tachycardia likely from withdrawal.  I asked him if he was interested in stopping alcohol but states he will probably continue drinking when he gets home.  He did not drink his usual amount of beer yesterday and today because "I could not keep it down".  Although patient has an elevated  CIWA score, he has no intention in stopping EtOH.  He has had extensive work-up in the ER and prolonged monitoring with improvement in tachycardia and again no clinical signs suggest severe withdrawal.  He denies any suicidal ideation or plan, psychosis.  I think he is appropriate for discharge with antiemetic, protonix.  Suspect n/v/abd pain from gastritis/ETOH related.  Return precautions discussed.     Kinnie Feil, PA-C 06/04/19 2341    Daleen Bo, MD 06/05/19 1056

## 2019-06-04 NOTE — Discharge Instructions (Signed)
You had no signs of severe withdrawal.  Work up including CT of abdomen and pelvis was normal.   I have given you phenergan which will help with nausea.  Take over the counter omeprazole 40 mg daily.    See resources attached, call and go to a facility that has inpatient and/or outpatient detox services   Return for headaches, vision changes, bright red blood or black vomit or stool, chest pain, shortness of breath, suicidal or homicidal thoughts, auditory or visual or tactile hallucinations, seizures  [facility] [place], [place] [address] [place]  [place]  [place] [address] [phone]   [facility]   [address], [place] [address]  [phone]  [facility]  [address] [place], [place] [address]  [phone]

## 2019-06-04 NOTE — ED Provider Notes (Signed)
[facility] DEPT Provider Note   CSN: [id number] Arrival date & time: 06/04/19  1407     History   Chief Complaint Chief Complaint  Patient presents with   Eye Pain    HPI Samuel Reyes is a 63 y.o. male presenting for evaluation nausea, vomiting, abdominal pain, diarrhea, and headache.  Patient states few days ago he stopped drinking.  Since then, he has been having diarrhea, vomiting, and epigastric abdominal pain.  Patient states he normally drinks 4 40 ounce beers a day.  He had 12 ounces yesterday, but no other beer in the past 2 days.  He states last night he fell forward, and hit his head.  He thinks he lost consciousness.  He was evaluated by EMS, however declined treatment at that time.  He is here today because he has had a persistent headache, blurry vision, and continued abdominal symptoms.  He has not taken anything for his symptoms wound, ibuprofen.  He reports a history of 15 abdominal surgeries.  He is not on blood thinners.   Additional history came from chart review.  Patient with a history of alcoholism, Barrett's esophagus, CAD, COPD, DTs, headache, homelessness, hypertension, CVA.    HPI  Past Medical History:  Diagnosis Date   Alcoholism ([place])    Barrett's esophagus    CAD (coronary artery disease) 2002   MI, no intervention required   COPD (chronic obstructive pulmonary disease) ([place])    not on home O2   Depression    DTs (delirium tremens) ([place])    GERD (gastroesophageal reflux disease)    Headache    Homeless    Hypertension    Stroke [facility]) 2006    Patient Active Problem List   Diagnosis Date Noted   Pressure injury of skin 03/25/2019   Leukocytosis 03/25/2019   Thrombocytosis ([place]) 03/25/2019   Sunburn 03/25/2019   Epigastric abdominal pain 03/25/2019   QT prolongation 09/09/2018   Overdose 09/06/2018   Substance induced mood disorder ([place]) 07/25/2018   MDD (major depressive disorder),  recurrent episode, severe ([place]) 07/18/2018   MDD (major depressive disorder), recurrent severe, without psychosis ([place]) 08/26/2017   Alcohol withdrawal ([place]) 08/08/2017   COPD with acute bronchitis ([place]) 07/29/2017   Alcohol use    Homelessness 06/24/2017   Lung nodule 09/03/2016   Tobacco abuse 09/03/2016   Insomnia 09/03/2016   HCAP (healthcare-associated pneumonia) 08/22/2016   Bronchiolitis 08/21/2016   Malnutrition of moderate degree 08/04/2016   Atrial tachycardia ([place]) 08/02/2016   Impaired glucose tolerance 08/02/2016   Alcohol use with alcohol-induced mood disorder ([place]) 12/12/2015   Syncope 11/15/2015   Chest pain 11/15/2015   Nausea and vomiting 11/15/2015   Abdominal pain 11/15/2015   Major depressive disorder, recurrent episode, moderate with anxious distress ([place]) 10/30/2015   Alcohol use disorder, severe, dependence ([place]) 10/29/2015   COPD exacerbation ([place]) 09/20/2015   Acute respiratory failure with hypoxia ([place]) 09/18/2015   Suicidal ideation 09/17/2015   Alcohol intoxication ([place]) 09/17/2015   Depression 09/17/2015   Benign essential HTN 09/17/2015   Hypokalemia 09/17/2015   Hyponatremia 09/17/2015   Coffee ground emesis 09/17/2015   COPD (chronic obstructive pulmonary disease) ([place]) 02/17/2013   GERD (gastroesophageal reflux disease) 02/17/2013    Past Surgical History:  Procedure Laterality Date   BACK SURGERY     3 cervical spine surgeries C4-C5 fused   COLONOSCOPY N/A 01/04/2014   Procedure: COLONOSCOPY;  Surgeon: Danie Binder, MD;  Location: [facility] ENDO SUITE;  Service: Endoscopy;  Laterality: N/A;  1:45   ESOPHAGOGASTRODUODENOSCOPY N/A 01/04/2014   Procedure: ESOPHAGOGASTRODUODENOSCOPY (EGD);  Surgeon: Danie Binder, MD;  Location: [facility] ENDO SUITE;  Service: Endoscopy;  Laterality: N/A;   FINGER SURGERY Left    2nd, 3rd, & 4th fingers were cut off by table saw and reattached   GASTRECTOMY     HERNIA REPAIR      INCISIONAL HERNIA REPAIR N/A 01/20/2014   Procedure: LAPAROSCOPIC RECURRENT  INCISIONAL HERNIA with mesh;  Surgeon: Edward Jolly, MD;  Location: [facility] ORS;  Service: General;  Laterality: N/A;   rt knee arthroscopic surgery     SHOULDER SURGERY Bilateral    3 surgeries on on left, 2 surgeries on right         Home Medications    Prior to Admission medications   Medication Sig Start Date End Date Taking? Authorizing Provider  albuterol (VENTOLIN HFA) 108 (90 Base) MCG/ACT inhaler Inhale 2 puffs into the lungs every 4 (four) hours as needed for wheezing or shortness of breath. 01/15/19   Ladell Pier, MD  gabapentin (NEURONTIN) 300 MG capsule Take 1 capsule (300 mg total) by mouth 3 (three) times daily. 03/29/19   Shelly Coss, MD  pantoprazole (PROTONIX) 40 MG tablet Take 1 tablet (40 mg total) by mouth 2 (two) times daily. For reflux 03/19/19   Cristal Ford, DO  sertraline (ZOLOFT) 100 MG tablet Take 1 tablet (100 mg total) by mouth daily. 04/07/19   Johnn Hai, MD    Family History Family History  Problem Relation Age of Onset   Cancer Father        bone   Cancer Brother        lungs   Stroke Maternal Grandmother    Asthma Son        died at age 4 in his sleep    Spina bifida Son        died at age 82    Dementia Mother    Colon cancer Neg Hx     Social History Social History   Tobacco Use   Smoking status: Current Every Day Smoker    Packs/day: 1.00    Years: 52.00    Pack years: 52.00    Types: Cigarettes    Start date: 07/30/1966   Smokeless tobacco: Never Used   Tobacco comment: uses nicotine patch  Substance Use Topics   Alcohol use: Yes    Alcohol/week: 15.0 standard drinks    Types: 15 Cans of beer per week    Comment: not drink for 8 weeks   Drug use: No    Comment: denied using any drugs     Allergies   Bee venom, Penicillins, and Vancomycin   Review of Systems Review of Systems  Eyes: Positive for visual disturbance.    Gastrointestinal: Positive for abdominal pain, diarrhea, nausea and vomiting. Negative for blood in stool.  Neurological: Positive for headaches.  All other systems reviewed and are negative.    Physical Exam Updated Vital Signs BP (!) 146/82    Pulse (!) 123    Temp 99 F (37.2 C) (Oral)    Resp (!) 21    SpO2 99%   Physical Exam Vitals signs and nursing note reviewed.  Constitutional:      General: He is not in acute distress.    Appearance: He is well-developed.     Comments: Appears nontoxic  HENT:     Head: Normocephalic.     Comments: Right periorbital swelling  and contusions. Eyes:     Intraocular pressure: Right eye pressure is 13 mmHg. Left eye pressure is 12 mmHg. Measurements were taken using a handheld tonometer.    Extraocular Movements: Extraocular movements intact.     Conjunctiva/sclera: Conjunctivae normal.     Pupils: Pupils are equal, round, and reactive to light.     Comments: EOMI and PERRLA.  No entrapment.  IOP is equal.  Neck:     Musculoskeletal: Normal range of motion and neck supple.  Cardiovascular:     Rate and Rhythm: Normal rate and regular rhythm.     Pulses: Normal pulses.  Pulmonary:     Effort: Pulmonary effort is normal. No respiratory distress.     Breath sounds: Normal breath sounds. No wheezing.  Abdominal:     General: There is no distension.     Palpations: Abdomen is soft. There is no mass.     Tenderness: There is abdominal tenderness. There is no guarding or rebound.     Comments: Generalized tenderness palpation the abdomen, worse in epigastric area.  No rigidity, guarding, distention.  Multiple surgical scars noted.  Musculoskeletal: Normal range of motion.  Skin:    General: Skin is warm and dry.     Capillary Refill: Capillary refill takes less than 2 seconds.  Neurological:     Mental Status: He is alert and oriented to person, place, and time.      ED Treatments / Results  Labs (all labs ordered are listed, but  only abnormal results are displayed) Labs Reviewed  CBC WITH DIFFERENTIAL/PLATELET - Abnormal; Notable for the following components:      Result Value   WBC 15.0 (*)    RDW 16.2 (*)    Platelets 495 (*)    Neutro Abs 13.1 (*)    All other components within normal limits  COMPREHENSIVE METABOLIC PANEL - Abnormal; Notable for the following components:   CO2 21 (*)    Glucose, Bld 102 (*)    Total Protein 8.5 (*)    Anion gap 18 (*)    All other components within normal limits  LIPASE, BLOOD  URINALYSIS, ROUTINE W REFLEX MICROSCOPIC    EKG None  Radiology Ct Head Wo Contrast  Result Date: 06/04/2019 CLINICAL DATA:  Intoxicated patient. Found sleeping outside hotel, apparently fell. Small laceration above the RIGHT eye. Eye is bruised and swollen. Patient complains of blurred vision in the affected eye. EXAM: CT HEAD WITHOUT CONTRAST CT MAXILLOFACIAL WITHOUT CONTRAST TECHNIQUE: Multidetector CT imaging of the head and maxillofacial structures were performed using the standard protocol without intravenous contrast. Multiplanar CT image reconstructions of the maxillofacial structures were also generated. COMPARISON:  10/12/2018 FINDINGS: CT HEAD FINDINGS Brain: There is central and cortical atrophy. There is no intra or extra-axial fluid collection or mass lesion. The basilar cisterns and ventricles have a normal appearance. There is no CT evidence for acute infarction or hemorrhage. Vascular: No hyperdense vessel or unexpected calcification. Skull: Normal. Negative for fracture or focal lesion. Other: None. CT MAXILLOFACIAL FINDINGS Osseous: No acute fracture. Remote fractures of the mandibular condyles bilaterally. Degenerative changes are identified in the UPPER cervical spine. There is 3 millimeters anterolisthesis of C2 on C3, likely degenerative. Orbits: There is preseptal soft tissue swelling of the RIGHT orbit, not associated with injury of the globe. No orbital wall fracture. Sinuses:  Clear. Soft tissues: Preseptal soft tissue swelling of the RIGHT orbit. RIGHT frontal scalp edema. IMPRESSION: 1. No evidence for acute intracranial abnormality. 2.  Central and cortical atrophy. 3. Preseptal soft tissue swelling of the RIGHT orbit. 4. RIGHT frontal scalp edema.  No underlying calvarial fracture. 5. No evidence for acute maxillofacial fracture. 6. Remote fractures of the mandibular condyles bilaterally. 7. Degenerative changes in the UPPER cervical spine. Electronically Signed   By: Nolon Nations M.D.   On: 06/04/2019 19:33   Ct Maxillofacial Wo Contrast  Result Date: 06/04/2019 CLINICAL DATA:  Intoxicated patient. Found sleeping outside hotel, apparently fell. Small laceration above the RIGHT eye. Eye is bruised and swollen. Patient complains of blurred vision in the affected eye. EXAM: CT HEAD WITHOUT CONTRAST CT MAXILLOFACIAL WITHOUT CONTRAST TECHNIQUE: Multidetector CT imaging of the head and maxillofacial structures were performed using the standard protocol without intravenous contrast. Multiplanar CT image reconstructions of the maxillofacial structures were also generated. COMPARISON:  10/12/2018 FINDINGS: CT HEAD FINDINGS Brain: There is central and cortical atrophy. There is no intra or extra-axial fluid collection or mass lesion. The basilar cisterns and ventricles have a normal appearance. There is no CT evidence for acute infarction or hemorrhage. Vascular: No hyperdense vessel or unexpected calcification. Skull: Normal. Negative for fracture or focal lesion. Other: None. CT MAXILLOFACIAL FINDINGS Osseous: No acute fracture. Remote fractures of the mandibular condyles bilaterally. Degenerative changes are identified in the UPPER cervical spine. There is 3 millimeters anterolisthesis of C2 on C3, likely degenerative. Orbits: There is preseptal soft tissue swelling of the RIGHT orbit, not associated with injury of the globe. No orbital wall fracture. Sinuses: Clear. Soft tissues:  Preseptal soft tissue swelling of the RIGHT orbit. RIGHT frontal scalp edema. IMPRESSION: 1. No evidence for acute intracranial abnormality. 2. Central and cortical atrophy. 3. Preseptal soft tissue swelling of the RIGHT orbit. 4. RIGHT frontal scalp edema.  No underlying calvarial fracture. 5. No evidence for acute maxillofacial fracture. 6. Remote fractures of the mandibular condyles bilaterally. 7. Degenerative changes in the UPPER cervical spine. Electronically Signed   By: Nolon Nations M.D.   On: 06/04/2019 19:33    Procedures Procedures (including critical care time)  Medications Ordered in ED Medications  famotidine (PEPCID) IVPB 20 mg premix (has no administration in time range)  sodium chloride (PF) 0.9 % injection (has no administration in time range)  sodium chloride 0.9 % bolus 1,000 mL (has no administration in time range)  iohexol (OMNIPAQUE) 300 MG/ML solution 100 mL (has no administration in time range)  ondansetron (ZOFRAN) injection 4 mg (4 mg Intravenous Given 06/04/19 1828)  sodium chloride 0.9 % bolus 1,000 mL (0 mLs Intravenous Stopped 06/04/19 2020)  tetracaine (PONTOCAINE) 0.5 % ophthalmic solution 1 drop (1 drop Both Eyes Given 06/04/19 1828)  ketorolac (TORADOL) 15 MG/ML injection 15 mg (15 mg Intravenous Given 06/04/19 2020)  LORazepam (ATIVAN) injection 2 mg (2 mg Intravenous Given 06/04/19 2131)  metoCLOPramide (REGLAN) injection 10 mg (10 mg Intravenous Given 06/04/19 2131)     Initial Impression / Assessment and Plan / ED Course  I have reviewed the triage vital signs and the nursing notes.  Pertinent labs & imaging results that were available during my care of the patient were reviewed by me and considered in my medical decision making (see chart for details).      Pt presenting for evaluation of nausea, vomiting, diarrhea, epigastric pain.  Also reporting headache after fall yesterday and left eye blurry vision.  Exam shows patient who is tachycardic, but  otherwise appears nontoxic.  Consider dehydration versus withdrawal as cause of tachycardia.  Consider viral illness versus  pancreatitis versus gastritis versus PUD as cause of epigastric pain, nausea, vomiting.  Will obtain labs, give fluids, Zofran, and reassess.  Will obtain CTs of the head and maxillofacial for further evaluation to the fall yesterday and surrounding eye contusions.  CT is negative for acute bleed or fracture.  Shows periorbital swelling.  No entrapment.  IOP is equal bilaterally, no objective evidence of globe injury at this time.  On reassessment, patient remains tachycardic despite a liter of fluids, and has continued vomiting despite Zofran.  Will give further pain and nausea control.  EKG to assess for QT.  Ativan for possible withdrawal.  Labs show leukocytosis at 15, otherwise reassuring.  Lipase is normal, doubt pancreatitis.  Due to continued tachycardia, pain, vomiting, and leukocytosis, will obtain CT.  Patient signed out to Shary Key, PA-C for follow-up of CT and reassessment of sxs.  If CT is negative and symptoms are improved and patient is tolerating p.o., he can be discharged.  If patient has continues vomiting or vitals remained abnl, he may need further management.  Final Clinical Impressions(s) / ED Diagnoses   Final diagnoses:  None    ED Discharge Orders    None       Franchot Heidelberg, PA-C 06/04/19 2141    Lucrezia Starch, MD 06/06/19 [phone]

## 2019-06-04 NOTE — ED Notes (Signed)
EDP made aware of CIWA. Patient able to tolerate PO liquids and ambulate without assistance.

## 2019-06-05 MED FILL — PROMETHAZINE 25 MG TABLET: 25 | 2 days supply | Qty: 8 | Fill #0

## 2019-06-16 ENCOUNTER — Encounter (HOSPITAL_COMMUNITY): Payer: Self-pay | Admitting: Behavioral Health

## 2019-06-16 ENCOUNTER — Other Ambulatory Visit: Payer: Self-pay

## 2019-06-16 ENCOUNTER — Observation Stay (HOSPITAL_COMMUNITY)
Admission: RE | Admit: 2019-06-16 | Discharge: 2019-06-17 | Disposition: A | Discharge status: Home / Self Care | Payer: Medicaid Other | Attending: Psychiatry | Admitting: Psychiatry

## 2019-06-16 DIAGNOSIS — F329 Major depressive disorder, single episode, unspecified: Secondary | ICD-10-CM | POA: Insufficient documentation

## 2019-06-16 DIAGNOSIS — J449 Chronic obstructive pulmonary disease, unspecified: Secondary | ICD-10-CM | POA: Insufficient documentation

## 2019-06-16 DIAGNOSIS — R45851 Suicidal ideations: Secondary | ICD-10-CM | POA: Diagnosis not present

## 2019-06-16 DIAGNOSIS — F332 Major depressive disorder, recurrent severe without psychotic features: Secondary | ICD-10-CM | POA: Diagnosis present

## 2019-06-16 DIAGNOSIS — G4700 Insomnia, unspecified: Secondary | ICD-10-CM | POA: Insufficient documentation

## 2019-06-16 DIAGNOSIS — Z590 Homelessness: Secondary | ICD-10-CM | POA: Diagnosis not present

## 2019-06-16 DIAGNOSIS — Z79899 Other long term (current) drug therapy: Secondary | ICD-10-CM | POA: Insufficient documentation

## 2019-06-16 DIAGNOSIS — Z8673 Personal history of transient ischemic attack (TIA), and cerebral infarction without residual deficits: Secondary | ICD-10-CM | POA: Diagnosis not present

## 2019-06-16 DIAGNOSIS — I2510 Atherosclerotic heart disease of native coronary artery without angina pectoris: Secondary | ICD-10-CM | POA: Insufficient documentation

## 2019-06-16 DIAGNOSIS — F1020 Alcohol dependence, uncomplicated: Secondary | ICD-10-CM | POA: Insufficient documentation

## 2019-06-16 DIAGNOSIS — K219 Gastro-esophageal reflux disease without esophagitis: Secondary | ICD-10-CM | POA: Insufficient documentation

## 2019-06-16 DIAGNOSIS — I10 Essential (primary) hypertension: Secondary | ICD-10-CM | POA: Diagnosis not present

## 2019-06-16 DIAGNOSIS — Z20828 Contact with and (suspected) exposure to other viral communicable diseases: Secondary | ICD-10-CM | POA: Insufficient documentation

## 2019-06-16 DIAGNOSIS — F1994 Other psychoactive substance use, unspecified with psychoactive substance-induced mood disorder: Secondary | ICD-10-CM | POA: Diagnosis present

## 2019-06-16 LAB — CBC
HCT: 39 % (ref 39.0–52.0)
Hemoglobin: 12.4 g/dL — ABNORMAL LOW (ref 13.0–17.0)
MCH: 27.9 pg (ref 26.0–34.0)
MCHC: 31.8 g/dL (ref 30.0–36.0)
MCV: 87.8 fL (ref 80.0–100.0)
Platelets: 297 10*3/uL (ref 150–400)
RBC: 4.44 MIL/uL (ref 4.22–5.81)
RDW: 16.3 % — ABNORMAL HIGH (ref 11.5–15.5)
WBC: 8.7 10*3/uL (ref 4.0–10.5)
nRBC: 0 % (ref 0.0–0.2)

## 2019-06-16 LAB — COMPREHENSIVE METABOLIC PANEL
ALT: 13 U/L (ref 0–44)
AST: 19 U/L (ref 15–41)
Albumin: 3.3 g/dL — ABNORMAL LOW (ref 3.5–5.0)
Alkaline Phosphatase: 64 U/L (ref 38–126)
Anion gap: 8 (ref 5–15)
BUN: 13 mg/dL (ref 8–23)
CO2: 25 mmol/L (ref 22–32)
Calcium: 8.5 mg/dL — ABNORMAL LOW (ref 8.9–10.3)
Chloride: 107 mmol/L (ref 98–111)
Creatinine, Ser: 0.79 mg/dL (ref 0.61–1.24)
GFR calc Af Amer: >60 mL/min (ref >60)
GFR calc non Af Amer: >60 mL/min (ref >60)
Glucose, Bld: 60 mg/dL — ABNORMAL LOW (ref 70–99)
Potassium: 3.6 mmol/L (ref 3.5–5.1)
Sodium: 140 mmol/L (ref 135–145)
Total Bilirubin: 0.5 mg/dL (ref 0.3–1.2)
Total Protein: 6.4 g/dL — ABNORMAL LOW (ref 6.5–8.1)

## 2019-06-16 LAB — SARS CORONAVIRUS 2 BY RT PCR (HOSPITAL ORDER, PERFORMED IN ~~LOC~~ HOSPITAL LAB): SARS Coronavirus 2: NEGATIVE

## 2019-06-16 LAB — ETHANOL: Alcohol, Ethyl (B): <10 mg/dL (ref <10)

## 2019-06-16 LAB — TSH: TSH: 1.187 u[IU]/mL (ref 0.350–4.500)

## 2019-06-16 MED ORDER — HYDROXYZINE HCL 25 MG PO TABS
25.0000 mg | ORAL_TABLET | Freq: Three times a day (TID) | ORAL | Status: DC | PRN
  Administered 2019-06-16: 25 mg via ORAL
  Filled 2019-06-16 (×2): qty 1

## 2019-06-16 MED ORDER — ALUM & MAG HYDROXIDE-SIMETH 200-200-20 MG/5ML PO SUSP
30.0000 mL | ORAL | Status: DC | PRN
  Filled 2019-06-16: qty 30

## 2019-06-16 MED ORDER — ONDANSETRON 4 MG PO TBDP: 4.0000 mg | ORAL_TABLET | Freq: Four times a day (QID) | ORAL | Status: DC | PRN

## 2019-06-16 MED ORDER — LOPERAMIDE HCL 2 MG PO CAPS: 2.0000 mg | ORAL_CAPSULE | ORAL | Status: DC | PRN

## 2019-06-16 MED ORDER — TRAZODONE HCL 50 MG PO TABS
50.0000 mg | ORAL_TABLET | Freq: Every evening | ORAL | Status: DC | PRN
  Administered 2019-06-16: 50 mg via ORAL
  Filled 2019-06-16 (×2): qty 1

## 2019-06-16 MED ORDER — GABAPENTIN 300 MG PO CAPS
300.0000 mg | ORAL_CAPSULE | Freq: Three times a day (TID) | ORAL | Status: DC
  Administered 2019-06-16 – 2019-06-17 (×2): 300 mg via ORAL
  Filled 2019-06-16 (×2): qty 1

## 2019-06-16 MED ORDER — PANTOPRAZOLE SODIUM 40 MG PO TBEC
40.0000 mg | DELAYED_RELEASE_TABLET | Freq: Two times a day (BID) | ORAL | Status: DC
  Administered 2019-06-16 – 2019-06-17 (×2): 40 mg via ORAL
  Filled 2019-06-16 (×2): qty 1

## 2019-06-16 MED ORDER — VITAMIN B-1 100 MG PO TABS
100.0000 mg | ORAL_TABLET | Freq: Every day | ORAL | Status: DC
  Administered 2019-06-17: 100 mg via ORAL
  Filled 2019-06-16: qty 1

## 2019-06-16 MED ORDER — ALBUTEROL SULFATE HFA 108 (90 BASE) MCG/ACT IN AERS: 1.0000 | INHALATION_SPRAY | RESPIRATORY_TRACT | Status: DC | PRN

## 2019-06-16 MED ORDER — MAGNESIUM HYDROXIDE 400 MG/5ML PO SUSP
30.0000 mL | Freq: Every day | ORAL | Status: DC | PRN
  Filled 2019-06-16: qty 30

## 2019-06-16 MED ORDER — SERTRALINE HCL 25 MG PO TABS
25.0000 mg | ORAL_TABLET | Freq: Every day | ORAL | Status: DC
  Administered 2019-06-17: 25 mg via ORAL
  Filled 2019-06-16: qty 1

## 2019-06-16 MED ORDER — LORAZEPAM 1 MG PO TABS: 1.0000 mg | ORAL_TABLET | Freq: Four times a day (QID) | ORAL | Status: DC | PRN

## 2019-06-16 MED ORDER — ACETAMINOPHEN 325 MG PO TABS
650.0000 mg | ORAL_TABLET | Freq: Four times a day (QID) | ORAL | Status: DC | PRN
  Administered 2019-06-17: 650 mg via ORAL
  Filled 2019-06-16 (×2): qty 2

## 2019-06-16 MED ORDER — THIAMINE HCL 100 MG/ML IJ SOLN: 100.0000 mg | Freq: Once | INTRAMUSCULAR | Status: DC

## 2019-06-16 MED ORDER — ADULT MULTIVITAMIN W/MINERALS CH
1.0000 | ORAL_TABLET | Freq: Every day | ORAL | Status: DC
  Administered 2019-06-17: 10:00:00 1 via ORAL
  Filled 2019-06-16: qty 1

## 2019-06-16 NOTE — BH Assessment (Signed)
Assessment Note  Samuel Reyes is an 63 y.o. male who presented to [name] as a walk-in stating that he was suicidal and planned to walk into traffic.  However, his emotions did not match what he was saying.  Patient indicated that he is currently homeless and stated [place]. He stated that he has been homeless for the past three years. He stated that his wife and two sons have died and stated that "I have nothing left, I am tired of living and I would be better off dead."  Patient stated that he had three prior suicide attempts.  Patient denied that he had ever had any inpatient treatment, but the FNP, Harriett Sine, stated that he had been on the unit previously.  Patient indicated that he had no current outpatient provider.  Patient denied HI/Psychosis, but indicated that he had been drinking 3-4 forty ounce beers daily with his last use a couple days ago.  Patient denied any drug use.  Patient stated that he has not been sleeping well and stated that he averaged 3 hours per night and stated that he has not been eating well and has recent weight loss of eight pounds. Patient denied any history of abuse or self-mutilation.  Patient presented as alert and oriented.  He was pleasant and did not present as looking depressed as he self-proclaimed.  His judgment, insight and impulse control are characteristically impaired.  He did not appear to be responding to any internal stimuli.  His eye contact was good and his speech coherent.  His thoughts were organized and his memory intact.  Diagnosis: F33.2 MDD Recurrent Severe / F10.20 Alcohol Use Disorder Severe  Past Medical History:  Past Medical History:  Diagnosis Date  . Alcoholism ([place])   . Barrett's esophagus   . CAD (coronary artery disease) 2002   MI, no intervention required  . COPD (chronic obstructive pulmonary disease) (HCC)    not on home O2  . Depression   . DTs (delirium tremens) ([place])   . GERD (gastroesophageal reflux  disease)   . Headache   . Homeless   . Hypertension   . Stroke [facility]) 2006    Past Surgical History:  Procedure Laterality Date  . BACK SURGERY     3 cervical spine surgeries C4-C5 fused  . COLONOSCOPY N/A 01/04/2014   Procedure: COLONOSCOPY;  Surgeon: Danie Binder, MD;  Location: [facility] ENDO [facility];  Service: Endoscopy;  Laterality: N/A;  1:45  . ESOPHAGOGASTRODUODENOSCOPY N/A 01/04/2014   Procedure: ESOPHAGOGASTRODUODENOSCOPY (EGD);  Surgeon: Danie Binder, MD;  Location: [facility] ENDO [facility];  Service: Endoscopy;  Laterality: N/A;  . FINGER SURGERY Left    2nd, 3rd, & 4th fingers were cut off by table saw and reattached  . GASTRECTOMY    . HERNIA REPAIR    . INCISIONAL HERNIA REPAIR N/A 01/20/2014   Procedure: LAPAROSCOPIC RECURRENT  INCISIONAL HERNIA with mesh;  Surgeon: Edward Jolly, MD;  Location: [facility] ORS;  Service: General;  Laterality: N/A;  . rt knee arthroscopic surgery    . SHOULDER SURGERY Bilateral    3 surgeries on on left, 2 surgeries on right     Family History:  Family History  Problem Relation Age of Onset  . Cancer Father        bone  . Cancer Brother        lungs  . Stroke Maternal Grandmother   . Asthma Son  died at age 15 in his sleep   . Spina bifida Son        died at age 3   . Dementia Mother   . Colon cancer Neg Hx     Social History:  reports that he has been smoking cigarettes. He started smoking about 52 years ago. He has a 52.00 pack-year smoking history. He has never used smokeless tobacco. He reports current alcohol use of about 15.0 standard drinks of alcohol per week. He reports that he does not use drugs.  Additional Social History:  Alcohol / Drug Use Pain Medications: see MAR Prescriptions: see MAR Over the Counter: see MAR History of alcohol / drug use?: Yes Longest period of sobriety (when/how long): none reported Negative Consequences of Use: Financial, Legal, Personal relationships, Work / School Substance #1 Name of Substance  1: alcohol 1 - Age of First Use: 17 1 - Amount (size/oz): 3-4 forties 1 - Frequency: daily 1 - Duration: since onset 1 - Last Use / Amount: last use was two days ago  CIWA: CIWA-Ar BP: (!) 131/93 Pulse Rate: (!) 110 COWS:    Allergies:  Allergies  Allergen Reactions  . Bee Venom Anaphylaxis  . Penicillins Rash    Has patient had a PCN reaction causing immediate rash, facial/tongue/throat swelling, SOB or lightheadedness with hypotension: {Yes Has patient had a PCN reaction causing severe rash involving mucus membranes or skin necrosis: YES Has patient had a PCN reaction that required hospitalization Yes Has patient had a PCN reaction occurring within the last 10 years: YES If all of the above answers are "NO", then may proceed with Cephalosporin use.   . Vancomycin Tinitus    Home Medications: (Not in a hospital admission)   OB/GYN Status:  No LMP for male patient.  General Assessment Data Location of Assessment: [facility] Assessment Services TTS Assessment: In system Is this a Tele or Face-to-Face Assessment?: Face-to-Face Is this an Initial Assessment or a Re-assessment for this encounter?: Initial Assessment Patient Accompanied by:: N/A Language Other than English: No Living Arrangements: Homeless/Shelter(lives in a tent) What gender do you identify as?: Male Marital status: Widowed Living Arrangements: Alone Can pt return to current living arrangement?: Yes Admission Status: Voluntary Is patient capable of signing voluntary admission?: Yes Referral Source: Self/Family/Friend Insurance type: (self-pay)  Medical Screening Exam ([place]) Medical Exam completed: Yes  Crisis Care Plan Living Arrangements: Alone Legal Guardian: Other:(self) Name of Psychiatrist: none Name of Therapist: none  Education Status Is patient currently in school?: No Is the patient employed, unemployed or receiving disability?: Unemployed  Risk to self with the past 6  months Suicidal Ideation: Yes-Currently Present Has patient been a risk to self within the past 6 months prior to admission? : No Suicidal Intent: No Has patient had any suicidal intent within the past 6 months prior to admission? : No Is patient at risk for suicide?: Yes Suicidal Plan?: Yes-Currently Present Has patient had any suicidal plan within the past 6 months prior to admission? : Yes Specify Current Suicidal Plan: walk into traffic Access to Means: Yes Specify Access to Suicidal Means: public streets What has been your use of drugs/alcohol within the last 12 months?: 2 days ago Previous Attempts/Gestures: Yes How many times?: 3 Other Self Harm Risks: homeless and minimal support Triggers for Past Attempts: None known Intentional Self Injurious Behavior: None Family Suicide History: No Recent stressful life event(s): Loss (Comment), Job Loss, Financial Problems Depression: Yes Depression Symptoms: Despondent, Insomnia, Isolating,  Loss of interest in usual pleasures, Feeling worthless/self pity Substance abuse history and/or treatment for substance abuse?: Yes Suicide prevention information given to non-admitted patients: Not applicable  Risk to Others within the past 6 months Homicidal Ideation: No Does patient have any lifetime risk of violence toward others beyond the six months prior to admission? : No Thoughts of Harm to Others: No Current Homicidal Intent: No Current Homicidal Plan: No Access to Homicidal Means: No Identified Victim: none History of harm to others?: No Assessment of Violence: None Noted Violent Behavior Description: (none) Does patient have access to weapons?: No Criminal Charges Pending?: No Does patient have a court date: No Is patient on probation?: No  Psychosis Hallucinations: None noted Delusions: None noted  Mental Status Report Appearance/Hygiene: Disheveled Eye Contact: Good Motor Activity: Unremarkable Speech:  Logical/coherent Level of Consciousness: Alert Mood: Depressed Affect: Inconsistent with thought content Anxiety Level: None Thought Processes: Coherent, Relevant Judgement: Impaired Orientation: Person, Place, Time, Situation Obsessive Compulsive Thoughts/Behaviors: Severe(with drinking)  Cognitive Functioning Concentration: Normal Memory: Recent Intact, Remote Intact Is patient IDD: No Insight: Fair Impulse Control: Poor Appetite: Poor Have you had any weight changes? : Loss Amount of the weight change? (lbs): 8 lbs Sleep: Decreased Total Hours of Sleep: 3 Vegetative Symptoms: Not bathing, Decreased grooming  ADLScreening [facility] Assessment Services) Patient's cognitive ability adequate to safely complete daily activities?: Yes Patient able to express need for assistance with ADLs?: Yes Independently performs ADLs?: Yes (appropriate for developmental age)  Prior Inpatient Therapy Prior Inpatient Therapy: Yes Prior Therapy Dates: multiple adm Prior Therapy Facilty/Provider(s): [facility] Reason for Treatment: depression/ETOH  Prior Outpatient Therapy Prior Outpatient Therapy: No Does patient have an ACCT team?: No Does patient have Intensive In-House Services?  : No Does patient have [facility] services? : No Does patient have P4CC services?: No  ADL Screening (condition at time of admission) Patient's cognitive ability adequate to safely complete daily activities?: Yes Is the patient deaf or have difficulty hearing?: No Does the patient have difficulty seeing, even when wearing glasses/contacts?: No Does the patient have difficulty concentrating, remembering, or making decisions?: No Patient able to express need for assistance with ADLs?: Yes Does the patient have difficulty dressing or bathing?: No Independently performs ADLs?: Yes (appropriate for developmental age) Does the patient have difficulty walking or climbing stairs?: No Weakness of Legs: None Weakness of  Arms/Hands: None  Home Assistive Devices/Equipment Home Assistive Devices/Equipment: None  Therapy Consults (therapy consults require a physician order) PT Evaluation Needed: No OT Evalulation Needed: No SLP Evaluation Needed: No Abuse/Neglect Assessment (Assessment to be complete while patient is alone) Abuse/Neglect Assessment Can Be Completed: Yes Physical Abuse: Denies Verbal Abuse: Denies Sexual Abuse: Denies Exploitation of patient/patient's resources: Denies Self-Neglect: Denies Values / Beliefs Cultural Requests During Hospitalization: None Spiritual Requests During Hospitalization: None Consults Spiritual Care Consult Needed: No Social Work Consult Needed: No Regulatory affairs officer (For Healthcare) Does Patient Have a Medical Advance Directive?: No Would patient like information on creating a medical advance directive?: No - Patient declined Nutrition Screen- [facility] Has the patient recently lost weight without trying?: No Has the patient been eating poorly because of a decreased appetite?: No Malnutrition Screening Tool Score: 0        Disposition: Per Harriett Sine, NP, patient will be admitted to OBS overnight and monitored for safety and withdrawal potential and will be reassessed in the morning. Disposition Initial Assessment Completed for this Encounter: Yes Disposition of Patient: (Overnight OBS)  On Site Evaluation by:  Reviewed with Physician:    Judeth Porch Vaidehi Braddy 06/16/2019 4:40 PM

## 2019-06-16 NOTE — Plan of Care (Signed)
[facility] Crisis Plan  Reason for Crisis Plan:  Chronic Mental Illness/Medical Illness, Crisis Stabilization, Medication Management and Substance Abuse   Plan of Care:  Referral for IOP and Referral for Substance Abuse  Family Support:    None  Current Living Environment:  Living Arrangements: Alone, Homeless  Insurance:   Hospital Account    Name Acct ID Class Status Primary Coverage   Samuel Reyes, Samuel Reyes [id number] [employer]        Guarantor Account (for Hospital Account [id number])    Name Relation to Pt Service Area Active? Acct Type   Samuel Reyes, Samuel Reyes   Address Phone       [place], [place] [address] [phone])          Coverage Information (for Hospital Account [id number])    F/O Payor/Plan Precert #   [facility] MEDICAID/[place] MEDICA[place]    Subscriber Subscriber #   Samuel Reyes, Samuel Reyes [id number] [facility]   Address Phone   [address] [place], [place] [address] [phone]      Legal Guardian:  Legal Guardian: Other:(self)  Primary Care Provider:  [name], NP  Current Outpatient Providers:  Samuel Reyes  Psychiatrist:  [name], NP Counselor/Therapist:  Name of Therapist: none  Compliant with Medications:  Yes  Additional Information: Per pt "I ran out of my medications, I have been sleeping only 2-3 hours a night for the last couple of weeks".    Samuel Reyes, Samuel Reyes 11/17/20207:21 PM

## 2019-06-16 NOTE — H&P (Signed)
Behavioral Health Medical Screening Exam  Samuel Reyes is an 63 y.o. male presenting for suicidal ideation to walk into traffic. He also is dependent on alcohol, with 2-3 40s/day with last drink 48 hours ago.   Total Time spent with patient: 15 minutes  Psychiatric Specialty Exam: Physical Exam  Nursing note and vitals reviewed. Constitutional: He is oriented to person, place, and time. He appears well-developed and well-nourished.  Respiratory: Effort normal.  Neurological: He is alert and oriented to person, place, and time.    Review of Systems  Constitutional: Negative.   Respiratory: Negative for cough and shortness of breath.   Cardiovascular: Negative for chest pain.  Gastrointestinal: Positive for nausea. Negative for abdominal pain, diarrhea and vomiting.  Neurological: Positive for sensory change (sensitive to light/sound) and headaches. Negative for dizziness, tremors and seizures.  Psychiatric/Behavioral: Positive for depression, substance abuse and suicidal ideas. Negative for hallucinations. The patient is nervous/anxious and has insomnia.     Blood pressure (!) 131/93, pulse (!) 110, temperature 98.5 F (36.9 C), temperature source Oral, resp. rate 18, SpO2 99 %.There is no height or weight on file to calculate BMI.  General Appearance: Casual  Eye Contact:  Good  Speech:  Normal Rate  Volume:  Normal  Mood:  Depressed  Affect:  Non-Congruent  Thought Process:  Coherent and Goal Directed  Orientation:  Full (Time, Place, and Person)  Thought Content:  Logical  Suicidal Thoughts:  Yes.  without intent/plan reports thoughts of walking into traffic  Homicidal Thoughts:  No  Memory:  Immediate;   Fair Recent;   Fair Remote;   Fair  Judgement:  Fair  Insight:  Lacking  Psychomotor Activity:  Normal  Concentration: Concentration: Fair and Attention Span: Fair  Recall:  [employer] of Knowledge:Fair  Language: Good  Akathisia:  No  Handed:  Right  AIMS (if  indicated):     Assets:  Communication Skills Leisure Time Resilience  Sleep:       Musculoskeletal: Strength & Muscle Tone: within normal limits Gait & Station: normal Patient leans: N/A  Blood pressure (!) 131/93, pulse (!) 110, temperature 98.5 F (36.9 C), temperature source Oral, resp. rate 18, SpO2 99 %.  Recommendations:  Overnight observation. Based on my evaluation the patient does not appear to have an emergency medical condition.  Connye Burkitt, NP 06/16/2019, 4:36 PM

## 2019-06-16 NOTE — H&P (Signed)
[facility] Provider Admission PAA/H&P  Patient Identification: Bertil Skates MRN:  [id number] Date of Evaluation:  06/16/2019 Chief Complaint:  Alcohol use disorder Principal Diagnosis: <principal problem not specified> Diagnosis:  Active Problems:   Alcohol use disorder, severe, dependence ([place])  History of Present Illness: Per assessment note: Samuel Reyes is an 63 y.o. male who presented to [facility] as a walk-in stating that he was suicidal and planned to walk into traffic.  However, his emotions did not match what he was saying.  Patient indicated that he is currently homeless and stated that he was living in the woods. He stated that he has been homeless for the past three years. He stated that his wife and two sons have died and stated that "I have nothing left, I am tired of living and I would be better off dead."  Patient stated that he had three prior suicide attempts.  Patient denied that he had ever had any inpatient treatment, but the NP, Harriett Sine, stated that he had been on the unit previously.  Patient indicated that he had no current outpatient provider.  Patient denied HI/Psychosis, but indicated that he had been drinking 3-4 forty ounce beers daily with his last use a couple days ago.  Patient denied any drug use.  Patient stated that he has not been sleeping well and stated that he averaged 3 hours per night and stated that he has not been eating well and has recent weight loss of eight pounds. Patient denied any history of abuse or self-mutilation. Patient presented as alert and oriented.  He was pleasant and did not present as looking depressed as he self-proclaimed.  His judgment, insight and impulse control are characteristically impaired.  He did not appear to be responding to any internal stimuli.  His eye contact was good and his speech coherent.  His thoughts were organized and his memory intact.  Mr. Gaudreau seen sitting in his room. He presents with euthymic affect and  reports suicidal ideation to walk into traffic. He has three prior admissions here this year for similar presentation. He states that he does not attend outpatient follow-up due to not liking the telepsych at [facility]. He has long history of alcohol use disorder. Per chart review he has documented history of DTs. He reports drinking 2-4 40 oz beers daily, with last drink 48 hours ago. He reports shakiness but does not appear to be in acute withdrawal at this time. He denies HI. He reports occasional AH of his wife's voice telling him to "come home," last heard this morning, but denies AVH at this time. He was discharged from [facility] in September 2020 on Zoloft 100 mg daily and Neurontin 300 mg TID. He has been off his medications for weeks.   Associated Signs/Symptoms: Depression Symptoms:  depressed mood, anhedonia, insomnia, fatigue, hopelessness, suicidal thoughts with specific plan, decreased appetite, (Hypo) Manic Symptoms:  denies Anxiety Symptoms:  Excessive Worry, Psychotic Symptoms:  Hallucinations: Auditory- intermittent PTSD Symptoms: Negative Total Time spent with patient: 30 minutes  Past Psychiatric History: Extensive history of depression and alcohol use disorder with multiple hospitalizations. Most recently admitted to [facility] in September 2020 for alcohol dependence with suicidal ideation- discharged at that time on Zoloft and Neurontin. One prior suicide attempt more than ten years ago via overdose.  Is the patient at risk to self? Yes.    Has the patient been a risk to self in the past 6 months? Yes.    Has the patient been  a risk to self within the distant past? Yes.    Is the patient a risk to others? No.  Has the patient been a risk to others in the past 6 months? No.  Has the patient been a risk to others within the distant past? No.   Prior Inpatient Therapy: Prior Inpatient Therapy: Yes Prior Therapy Dates: multiple adm Prior Therapy Facilty/Provider(s): [facility] Reason for  Treatment: depression/ETOH Prior Outpatient Therapy: Prior Outpatient Therapy: No Does patient have an ACCT team?: No Does patient have Intensive In-House Services?  : No Does patient have [facility] services? : No Does patient have P4CC services?: No  Alcohol Screening:   Substance Abuse History in the last 12 months:  Yes.   Consequences of Substance Abuse: Medical Consequences:  hx hematemesis, falls DT's: hx DT's Withdrawal Symptoms:   Diaphoresis Headaches Nausea Tremors Previous Psychotropic Medications: Yes  Psychological Evaluations: No  Past Medical History:  Past Medical History:  Diagnosis Date  . Alcoholism ([place])   . Barrett's esophagus   . CAD (coronary artery disease) 2002   MI, no intervention required  . COPD (chronic obstructive pulmonary disease) (HCC)    not on home O2  . Depression   . DTs (delirium tremens) ([place])   . GERD (gastroesophageal reflux disease)   . Headache   . Homeless   . Hypertension   . Stroke [name]) 2006    Past Surgical History:  Procedure Laterality Date  . BACK SURGERY     3 cervical spine surgeries C4-C5 fused  . COLONOSCOPY N/A 01/04/2014   Procedure: COLONOSCOPY;  Surgeon: Danie Binder, MD;  [facility];  Service: Endoscopy;  Laterality: N/A;  1:45  . ESOPHAGOGASTRODUODENOSCOPY N/A 01/04/2014   Procedure: ESOPHAGOGASTRODUODENOSCOPY (EGD);  Surgeon: Danie Binder, MD;  Location: [facility];  Service: Endoscopy;  Laterality: N/A;  . FINGER SURGERY Left    2nd, 3rd, & 4th fingers were cut off by table saw and reattached  . GASTRECTOMY    . HERNIA REPAIR    . INCISIONAL HERNIA REPAIR N/A 01/20/2014   Procedure: LAPAROSCOPIC RECURRENT  INCISIONAL HERNIA with mesh;  Surgeon: Edward Jolly, MD;  Location: [facility] ORS;  Service: General;  Laterality: N/A;  . rt knee arthroscopic surgery    . SHOULDER SURGERY Bilateral    3 surgeries on on left, 2 surgeries on right    Family History:  Family History  Problem Relation  Age of Onset  . Cancer Father        bone  . Cancer Brother        lungs  . Stroke Maternal Grandmother   . Asthma Son        died at age 27 in his sleep   . Spina bifida Son        died at age 31   . Dementia Mother   . Colon cancer Neg Hx    Family Psychiatric History: Sister with depression and alcohol use disorder. Half sister died by suicide. Tobacco Screening:   Social History:  Social History   Substance and Sexual Activity  Alcohol Use Yes  . Alcohol/week: 15.0 standard drinks  . Types: 15 Cans of beer per week   Comment: last drink 2 days ago     Social History   Substance and Sexual Activity  Drug Use No   Comment: denied using any drugs    Additional Social History: Marital status: Widowed    Pain Medications: see MAR Prescriptions: see  MAR Over the Counter: see MAR History of alcohol / drug use?: Yes Longest period of sobriety (when/how long): none reported Negative Consequences of Use: Financial, Legal, Personal relationships, Work / School Name of Substance 1: alcohol 1 - Age of First Use: 17 1 - Amount (size/oz): 3-4 forties 1 - Frequency: daily 1 - Duration: since onset 1 - Last Use / Amount: last use was two days ago                  Allergies:   Allergies  Allergen Reactions  . Bee Venom Anaphylaxis  . Penicillins Rash    Has patient had a PCN reaction causing immediate rash, facial/tongue/throat swelling, SOB or lightheadedness with hypotension: {Yes Has patient had a PCN reaction causing severe rash involving mucus membranes or skin necrosis: YES Has patient had a PCN reaction that required hospitalization Yes Has patient had a PCN reaction occurring within the last 10 years: YES If all of the above answers are "NO", then may proceed with Cephalosporin use.   . Vancomycin Tinitus   Lab Results: No results found for this or any previous visit (from the past 48 hour(s)).  Blood Alcohol level:  Lab Results  Component Value Date    ETH 213 (H) 03/13/2019   ETH 42 (H) [id number]    Metabolic Disorder Labs:  Lab Results  Component Value Date   HGBA1C 5.5 04/01/2019   MPG 111.15 04/01/2019   MPG 108 06/26/2017   No results found for: PROLACTIN Lab Results  Component Value Date   CHOL 208 (H) 04/01/2019   TRIG 70 04/01/2019   HDL 60 04/01/2019   CHOLHDL 3.5 04/01/2019   VLDL 14 04/01/2019   LDLCALC 134 (H) 04/01/2019   LDLCALC 46 03/18/2019    Current Medications: Current Outpatient Medications  Medication Sig Dispense Refill  . albuterol (VENTOLIN HFA) 108 (90 Base) MCG/ACT inhaler Inhale 2 puffs into the lungs every 4 (four) hours as needed for wheezing or shortness of breath. 18 g 1  . gabapentin (NEURONTIN) 300 MG capsule Take 1 capsule (300 mg total) by mouth 3 (three) times daily.    . pantoprazole (PROTONIX) 40 MG tablet Take 1 tablet (40 mg total) by mouth 2 (two) times daily. For reflux 60 tablet 1  . promethazine (PHENERGAN) 25 MG tablet Take 1 tablet (25 mg total) by mouth every 6 (six) hours as needed for up to 2 days for nausea or vomiting. 8 tablet 0  . sertraline (ZOLOFT) 100 MG tablet Take 1 tablet (100 mg total) by mouth daily. 90 tablet 1  . traZODone (DESYREL) 100 MG tablet Take 100 mg by mouth at bedtime.     Current Facility-Administered Medications  Medication Dose Route Frequency Provider Last Rate Last Dose  . acetaminophen (TYLENOL) tablet 650 mg  650 mg Oral Q6H PRN Connye Burkitt, NP      . albuterol (VENTOLIN HFA) 108 (90 Base) MCG/ACT inhaler 1-2 puff  1-2 puff Inhalation Q4H PRN Connye Burkitt, NP      . alum & mag hydroxide-simeth (MAALOX/MYLANTA) 200-200-20 MG/5ML suspension 30 mL  30 mL Oral Q4H PRN Connye Burkitt, NP      . gabapentin (NEURONTIN) capsule 300 mg  300 mg Oral TID Connye Burkitt, NP      . hydrOXYzine (ATARAX/VISTARIL) tablet 25 mg  25 mg Oral TID PRN Connye Burkitt, NP      . loperamide (IMODIUM) capsule 2-4 mg  2-4 mg Oral PRN  Connye Burkitt, NP      .  LORazepam (ATIVAN) tablet 1 mg  1 mg Oral Q6H PRN Connye Burkitt, NP      . magnesium hydroxide (MILK OF MAGNESIA) suspension 30 mL  30 mL Oral Daily PRN Connye Burkitt, NP      . multivitamin with minerals tablet 1 tablet  1 tablet Oral Daily Connye Burkitt, NP      . ondansetron (ZOFRAN-ODT) disintegrating tablet 4 mg  4 mg Oral Q6H PRN Connye Burkitt, NP      . pantoprazole (PROTONIX) EC tablet 40 mg  40 mg Oral BID Connye Burkitt, NP      . sertraline (ZOLOFT) tablet 25 mg  25 mg Oral Daily Harriett Sine E, NP      . thiamine (B-1) injection 100 mg  100 mg Intramuscular Once Connye Burkitt, NP      . Derrill Memo ON 06/17/2019] thiamine (VITAMIN B-1) tablet 100 mg  100 mg Oral Daily Connye Burkitt, NP      . traZODone (DESYREL) tablet 50 mg  50 mg Oral QHS PRN Connye Burkitt, NP       PTA Medications: (Not in a hospital admission)   Musculoskeletal: Strength & Muscle Tone: within normal limits Gait & Station: normal Patient leans: N/A  Psychiatric Specialty Exam: Physical Exam See Medical Screening note  ROS See Medical Screening note  Blood pressure (!) 131/93, pulse (!) 110, temperature 98.5 F (36.9 C), temperature source Oral, resp. rate 18, SpO2 99 %.There is no height or weight on file to calculate BMI.  See Medical Screening Exam note for Mental Status Exam.    Treatment Plan Summary: Daily contact with patient to assess and evaluate symptoms and progress in treatment and Medication management   Overnight observation for withdrawal symptoms- hx of DTs with last drink 48 hours ago.  Observation Level/Precautions:  15 minute checks Laboratory:  CBC Chemistry Profile UDS UA  TSH Medications:  Restart Zoloft 25 mg PO daily for depression, Neurontin 300 mg PO TID for anxiety/alcohol use disorder, Protonix 40 mg PO BID for GERD, albuterol PRN SOB for COPD, Ativan 1 mg PO Q6HR PRN CIWA>10 for alcohol withdrawal  Discharge Concerns:  Safety/stabilization Estimated LOS: 24  hours Other:      Connye Burkitt, NP 11/17/20204:50 PM

## 2019-06-16 NOTE — Progress Notes (Addendum)
Pt is a 63 y/o caucasian male who presents to [facility] as a walk in under voluntary status. Pt reports he's currently depressed with suicidal ideation "I have nothing to live for, I'm an alcoholic,I'm homeless now and live in the woods. Myy wife and sons are dead, my sister shot herself last year with my gun". Reports history of multiple suicide attempts in the past. States he has no family support. Denies HI, AVH and pain when assessed. Reports insomnia for 2 weeks now "I don't have my Trazodone, I've been sleeping only 2-3 hours a night, with my Trazodone, I sleep about 5 hours a night". Skin assessment done, belongings searched and secured per protocols. Q 15 minutes safety checks initiated without self harm gestures or outburst to note at this time.

## 2019-06-16 NOTE — Progress Notes (Signed)
Patient ID: Samuel Reyes, male   DOB: [birth year], 63 y.o.   MRN: [id number] Pt A&O x 4, sitting in dayroom at present, interacting with staff and patients.  Depressed with passive SI.  Monitoring for safety.

## 2019-06-17 ENCOUNTER — Encounter (HOSPITAL_COMMUNITY): Payer: Self-pay | Admitting: Registered Nurse

## 2019-06-17 ENCOUNTER — Ambulatory Visit (HOSPITAL_COMMUNITY): Payer: Self-pay

## 2019-06-17 DIAGNOSIS — F1020 Alcohol dependence, uncomplicated: Secondary | ICD-10-CM | POA: Diagnosis not present

## 2019-06-17 DIAGNOSIS — F1994 Other psychoactive substance use, unspecified with psychoactive substance-induced mood disorder: Secondary | ICD-10-CM

## 2019-06-17 DIAGNOSIS — R45851 Suicidal ideations: Secondary | ICD-10-CM | POA: Diagnosis not present

## 2019-06-17 MED ORDER — GABAPENTIN 300 MG PO CAPS: 300.0000 mg | ORAL_CAPSULE | Freq: Three times a day (TID) | ORAL | 0 refills | Status: DC

## 2019-06-17 MED ORDER — TRAZODONE HCL 50 MG PO TABS: 50.0000 mg | ORAL_TABLET | Freq: Every evening | ORAL | 0 refills | Status: DC | PRN

## 2019-06-17 MED ORDER — SERTRALINE HCL 25 MG PO TABS: 25.0000 mg | ORAL_TABLET | Freq: Every day | ORAL | 0 refills | Status: DC

## 2019-06-17 MED ORDER — ADULT MULTIVITAMIN W/MINERALS CH: 1.0000 | ORAL_TABLET | Freq: Every day | ORAL | 0 refills | Status: DC

## 2019-06-17 MED ORDER — HYDROXYZINE HCL 25 MG PO TABS: 25.0000 mg | ORAL_TABLET | Freq: Three times a day (TID) | ORAL | 0 refills | Status: DC | PRN

## 2019-06-17 MED FILL — SERTRALINE HCL 25 MG TABLET: 25 | 30 days supply | Qty: 30 | Fill #0

## 2019-06-17 NOTE — Discharge Summary (Signed)
[facility] Psych Observation Discharge  06/17/2019 4:21 PM Samuel Reyes  MRN:  [id number] Principal Problem: Substance induced mood disorder [facility]) Discharge Diagnoses: Principal Problem:   Substance induced mood disorder ([place]) Active Problems:   Alcohol use disorder, severe, dependence ([place])   MDD (major depressive disorder), recurrent episode, severe ([place])   Subjective: Samuel Reyes, 63 y.o., male patient seen via tele psych by this provider, Dr. Dwyane Dee; and chart reviewed on 06/17/19.  On evaluation Samuel Reyes reports he came to the hospital because he was homeless.  "I told them I was suicidal because I know would let me get into the hospital."  Patient reported he was living with a friend who is now passed away and he was unable to continue to pay the rent.  Patient also states that he has alcohol use problem and is interested in getting into a halfway house or a long-term rehab facility.  [facility]ed [facility] [place] transport with patient.  Patient states here he is interested.  At this time patient denies suicidal/self-harm/homicidal ideations, psychosis, paranoia.  Peers support ordered for patient and social work. During evaluation Samuel Reyes is alert/oriented x 4; calm/cooperative; and mood is congruent with affect.  He does not appear to be responding to internal/external stimuli or delusional thoughts.  Patient denies suicidal/self-harm/homicidal ideation, psychosis, and paranoia.  Patient answered question appropriately.     Total Time spent with patient: 30 minutes  Past Psychiatric History: See above   Past Medical History:  Past Medical History:  Diagnosis Date  . Alcoholism ([place])   . Barrett's esophagus   . CAD (coronary artery disease) 2002   MI, no intervention required  . COPD (chronic obstructive pulmonary disease) (HCC)    not on home O2  . Depression   . DTs (delirium tremens) ([place])   . GERD (gastroesophageal reflux disease)   . Headache   .  Homeless   . Hypertension   . Stroke [facility]) 2006    Past Surgical History:  Procedure Laterality Date  . BACK SURGERY     3 cervical spine surgeries C4-C5 fused  . COLONOSCOPY N/A 01/04/2014   Procedure: COLONOSCOPY;  Surgeon: Danie Binder, MD;  Location[facility];  Service: Endoscopy;  Laterality: N/A;  1:45  . ESOPHAGOGASTRODUODENOSCOPY N/A 01/04/2014   Procedure: ESOPHAGOGASTRODUODENOSCOPY (EGD);  Surgeon: Danie Binder, MD;  Location: [facility];  Service: Endoscopy;  Laterality: N/A;  . FINGER SURGERY Left    2nd, 3rd, & 4th fingers were cut off by table saw and reattached  . GASTRECTOMY    . HERNIA REPAIR    . INCISIONAL HERNIA REPAIR N/A 01/20/2014   Procedure: LAPAROSCOPIC RECURRENT  INCISIONAL HERNIA with mesh;  Surgeon: Edward Jolly, MD;  Location: [facility]S;  Service: General;  Laterality: N/A;  . rt knee arthroscopic surgery    . SHOULDER SURGERY Bilateral    3 surgeries on on left, 2 surgeries on right    Family History:  Family History  Problem Relation Age of Onset  . Cancer Father        bone  . Cancer Brother        lungs  . Stroke Maternal Grandmother   . Asthma Son        died at age 110 in his sleep   . Spina bifida Son        died at age 4   . Dementia Mother   . Colon cancer Neg Hx    Family  Psychiatric  History: Denies Social History:  Social History   Substance and Sexual Activity  Alcohol Use Yes  . Alcohol/week: 15.0 standard drinks  . Types: 15 Cans of beer per week   Comment: last drink 2 days ago     Social History   Substance and Sexual Activity  Drug Use No   Comment: denied using any drugs    Social History   Socioeconomic History  . Marital status: Widowed    Spouse name: Not on file  . Number of children: 3  . Years of education: Not on file  . Highest education level: Not on file  Occupational History  . Occupation: Disability  Social Needs  . Financial resource strain: Not on file  . Food insecurity     Worry: Not on file    Inability: Not on file  . Transportation needs    Medical: Yes    Non-medical: Yes  Tobacco Use  . Smoking status: Current Every Day Smoker    Packs/day: 1.00    Years: 52.00    Pack years: 52.00    Types: Cigarettes    Start date: 07/30/1966  . Smokeless tobacco: Never Used  . Tobacco comment: uses nicotine patch  Substance and Sexual Activity  . Alcohol use: Yes    Alcohol/week: 15.0 standard drinks    Types: 15 Cans of beer per week    Comment: last drink 2 days ago  . Drug use: No    Comment: denied using any drugs  . Sexual activity: Not Currently  Lifestyle  . Physical activity    Days per week: Not on file    Minutes per session: Not on file  . Stress: Not on file  Relationships  . Social Herbalist on phone: Not on file    Gets together: Not on file    Attends religious service: Not on file    Active member of club or organization: Not on file    Attends meetings of clubs or organizations: Not on file    Relationship status: Not on file  Other Topics Concern  . Not on file  Social History Narrative  . Not on file    Has this patient used any form of tobacco in the last 30 days? (Cigarettes, Smokeless Tobacco, Cigars, and/or Pipes) A prescription for an FDA-approved tobacco cessation medication was offered at discharge and the patient refused  Current Medications: Current Facility-Administered Medications  Medication Dose Route Frequency Provider Last Rate Last Dose  . acetaminophen (TYLENOL) tablet 650 mg  650 mg Oral Q6H PRN Hampton Abbot, MD   650 mg at 06/17/19 1045  . albuterol (VENTOLIN HFA) 108 (90 Base) MCG/ACT inhaler 1-2 puff  1-2 puff Inhalation Q4H PRN Connye Burkitt, NP      . alum & mag hydroxide-simeth (MAALOX/MYLANTA) 200-200-20 MG/5ML suspension 30 mL  30 mL Oral Q4H PRN Connye Burkitt, NP      . gabapentin (NEURONTIN) capsule 300 mg  300 mg Oral TID Hampton Abbot, MD   300 mg at 06/17/19 0954  . hydrOXYzine  (ATARAX/VISTARIL) tablet 25 mg  25 mg Oral TID PRN Hampton Abbot, MD   25 mg at 06/16/19 2101  . loperamide (IMODIUM) capsule 2-4 mg  2-4 mg Oral PRN Hampton Abbot, MD      . LORazepam (ATIVAN) tablet 1 mg  1 mg Oral Q6H PRN Hampton Abbot, MD      . magnesium hydroxide (MILK OF MAGNESIA) suspension  30 mL  30 mL Oral Daily PRN Connye Burkitt, NP      . multivitamin with minerals tablet 1 tablet  1 tablet Oral Daily Hampton Abbot, MD   1 tablet at 06/17/19 [phone]  . ondansetron (ZOFRAN-ODT) disintegrating tablet 4 mg  4 mg Oral Q6H PRN Hampton Abbot, MD      . pantoprazole (PROTONIX) EC tablet 40 mg  40 mg Oral BID Connye Burkitt, NP   40 mg at 06/17/19 0953  . sertraline (ZOLOFT) tablet 25 mg  25 mg Oral Daily Connye Burkitt, NP   25 mg at 06/17/19 0953  . thiamine (B-1) injection 100 mg  100 mg Intramuscular Once Connye Burkitt, NP      . thiamine (VITAMIN B-1) tablet 100 mg  100 mg Oral Daily Connye Burkitt, NP   100 mg at 06/17/19 0953  . traZODone (DESYREL) tablet 50 mg  50 mg Oral QHS PRN Hampton Abbot, MD   50 mg at 06/16/19 2101   Current Outpatient Medications  Medication Sig Dispense Refill  . albuterol (VENTOLIN HFA) 108 (90 Base) MCG/ACT inhaler Inhale 2 puffs into the lungs every 4 (four) hours as needed for wheezing or shortness of breath. 18 g 1  . gabapentin (NEURONTIN) 300 MG capsule Take 1 capsule (300 mg total) by mouth 3 (three) times daily. 90 capsule 0  . hydrOXYzine (ATARAX/VISTARIL) 25 MG tablet Take 1 tablet (25 mg total) by mouth 3 (three) times daily as needed for anxiety. 30 tablet 0  . [START ON 06/18/2019] Multiple Vitamin (MULTIVITAMIN WITH MINERALS) TABS tablet Take 1 tablet by mouth daily. 30 tablet 0  . [START ON 06/18/2019] sertraline (ZOLOFT) 25 MG tablet Take 1 tablet (25 mg total) by mouth daily. 30 tablet 0  . traZODone (DESYREL) 50 MG tablet Take 1 tablet (50 mg total) by mouth at bedtime as needed for sleep. 30 tablet 0   PTA Medications: No  medications prior to admission.    Musculoskeletal: Strength & Muscle Tone: within normal limits Gait & Station: normal Patient leans: N/A  Psychiatric Specialty Exam: Physical Exam  Nursing note and vitals reviewed. Constitutional: He is oriented to person, place, and time. He appears well-nourished. No distress.  Neck: Normal range of motion.  Respiratory: Effort normal.  Musculoskeletal: Normal range of motion.  Neurological: He is alert and oriented to person, place, and time.  Skin: Skin is warm and dry.  Psychiatric: He has a normal mood and affect. His behavior is normal. Judgment and thought content normal.    Review of Systems  Psychiatric/Behavioral: Depression:  Stable. Hallucinations: Denies. Substance abuse: Alcohol. Suicidal ideas: denies. Nervous/anxious: Stable. Insomnia:  Denies.   All other systems reviewed and are negative.   Blood pressure 139/88, pulse 99, temperature 98.4 F (36.9 C), temperature source Oral, resp. rate 18, SpO2 100 %.There is no height or weight on file to calculate BMI.  General Appearance: Casual  Eye Contact:  Good  Speech:  Clear and Coherent and Normal Rate  Volume:  Normal  Mood:  Appropriate  Affect:  Appropriate and Congruent  Thought Process:  Coherent and Goal Directed  Orientation:  Full (Time, Place, and Person)  Thought Content:  WDL  Suicidal Thoughts:  No  Homicidal Thoughts:  No  Memory:  Immediate;   Good Recent;   Good  Judgement:  Intact  Insight:  Present  Psychomotor Activity:  Normal  Concentration:  Concentration: Good and Attention Span: Good  Recall:  Good  Fund of Knowledge:  Good  Language:  Good  Akathisia:  No  Handed:  Right  AIMS (if indicated):     Assets:  Communication Skills Desire for Improvement  ADL's:  Intact  Cognition:  WNL  Sleep:        Demographic Factors:  Male and Caucasian  Loss Factors: Housing  Historical Factors: NA  Risk Reduction Factors:   Religious beliefs  about death  Continued Clinical Symptoms:  Alcohol/Substance Abuse/Dependencies Previous Psychiatric Diagnoses and Treatments  Cognitive Features That Contribute To Risk:  None    Suicide Risk:  Minimal: No identifiable suicidal ideation.  Patients presenting with no risk factors but with morbid ruminations; may be classified as minimal risk based on the severity of the depressive symptoms    Plan Of Care/Follow-up recommendations:  Activity:  As tolerated Diet:  Heart healthy    Discharge Instructions     For your behavioral health needs you are advised to follow up with [facility] [place].  New patients are seen at their walk-in clinic.  Walk-in hours are Monday - Friday from 8:30 am - 12:00 pm, and from 1:00 pm - 2:30 pm.  Walk-in patients are seen on a first come, first served basis, so try to arrive as early as possible for the best chance of being seen the same day:       [facility] [name], [place] [address]      [phone]     Disposition:  Patient psychiatrically cleared No evidence of imminent risk to self or others at present.   Patient does not meet criteria for psychiatric inpatient admission. Supportive therapy provided about ongoing stressors. Discussed crisis plan, support from social network, calling 911, coming to the Emergency Department, and calling Suicide Hotline.   Shuvon Rankin, NP 06/17/2019, 4:21 PM

## 2019-06-17 NOTE — BH Assessment (Signed)
[place] Assessment Progress Note  Per Shuvon Rankin, FNP, this pt does not require psychiatric hospitalization at this time.  Pt is to be discharged from the [facility] Observ[facility] Unit with referral information for [facility].  This has been included in pt's discharge instructions.  Shuvon reports that she will be ordering peer support and social work consults for pt.  Pt's nurse, Jan, has been notified.  Jalene Mullet, [place] Triage Specialist [phone]

## 2019-06-17 NOTE — Discharge Instructions (Signed)
For your behavioral health needs you are advised to follow up with [facility].  New patients are seen at their walk-in clinic.  Walk-in hours are Monday - Friday from 8:30 am - 12:00 pm, and from 1:00 pm - 2:30 pm.  Walk-in patients are seen on a first come, first served basis, so try to arrive as early as possible for the best chance of being seen the same day:       [facility]      [address]      [place], [place] [address]      [phone] 

## 2019-06-17 NOTE — Progress Notes (Signed)
   06/17/19 0800  Psych Admission Type (Psych Patients Only)  Admission Status Voluntary  Psychosocial Assessment  Patient Complaints Depression  Eye Contact Fair  Facial Expression Flat  Affect Depressed  Speech Logical/coherent  Interaction Assertive  Motor Activity Slow  Appearance/Hygiene Disheveled  Behavior Characteristics Cooperative  Mood Depressed;Sad  Thought Process  Coherency WDL  Content WDL  Delusions None reported or observed  Perception WDL  Hallucination None reported or observed  Judgment WDL  Confusion None  Danger to Self  Current suicidal ideation? Passive  Self-Injurious Behavior No self-injurious ideation or behavior indicators observed or expressed   Agreement Not to Harm Self Yes  Description of Agreement verbal contract  Danger to Others  Danger to Others None reported or observed   Pt reports that he has grief and loss related to losing his twos son and his wife. Pt lived in [place] 40 yrs and moved back to [facility] after his wife passed. Pt reports that he has had a hard time adjusting to [place] after being away for so long and is requesting help with housing. Pt has a daughter and two grandchildren that he has not had contact with for years.

## 2019-06-17 NOTE — Progress Notes (Signed)
Pt d/c from the hospital. All items returned. D/C instructions given.

## 2019-06-17 NOTE — Patient Outreach (Signed)
CPSS met with the patient in order to provide substance use recovery support and provide information for substance use recovery resources. Patient is currently experiencing homelessness and reports a history of daily alcohol use. Patient is interested in recovery housing. CPSS provided the patient with a flier/follow up information for [facility] recovery housing in [place]. CPSS also provided the patient with information for several different substance use recovery resources. Some of these resources include detox center list, residential/outpatient substance use treatment center list, [facility] [place] meeting list, and CPSS contact information. CPSS strongly encouraged the patient to follow up with CPSS if needed for further help with getting connected to substance use recovery resources.

## 2019-06-18 MED FILL — traZODone HCL 50 MG TABS: 50 | 30 days supply | Qty: 30 | Fill #0

## 2019-06-18 MED FILL — GABAPENTIN 300 MG CAPSULE: 300 | 30 days supply | Qty: 90 | Fill #0

## 2019-06-18 MED FILL — hydrOXYzine HCL 25 MG TABS: 25 | 10 days supply | Qty: 30 | Fill #0

## 2019-06-25 ENCOUNTER — Encounter (HOSPITAL_COMMUNITY): Payer: Self-pay

## 2019-06-25 ENCOUNTER — Other Ambulatory Visit: Payer: Self-pay

## 2019-06-25 ENCOUNTER — Emergency Department (HOSPITAL_COMMUNITY)
Admission: EM | Admit: 2019-06-25 | Discharge: 2019-06-26 | Disposition: A | Discharge status: Home / Self Care | Payer: Medicaid Other | Attending: Emergency Medicine | Admitting: Emergency Medicine

## 2019-06-25 DIAGNOSIS — Z9981 Dependence on supplemental oxygen: Secondary | ICD-10-CM | POA: Insufficient documentation

## 2019-06-25 DIAGNOSIS — J449 Chronic obstructive pulmonary disease, unspecified: Secondary | ICD-10-CM | POA: Diagnosis not present

## 2019-06-25 DIAGNOSIS — R401 Stupor: Secondary | ICD-10-CM | POA: Diagnosis not present

## 2019-06-25 DIAGNOSIS — Z79899 Other long term (current) drug therapy: Secondary | ICD-10-CM | POA: Insufficient documentation

## 2019-06-25 DIAGNOSIS — Z8673 Personal history of transient ischemic attack (TIA), and cerebral infarction without residual deficits: Secondary | ICD-10-CM | POA: Diagnosis not present

## 2019-06-25 DIAGNOSIS — Z20828 Contact with and (suspected) exposure to other viral communicable diseases: Secondary | ICD-10-CM | POA: Insufficient documentation

## 2019-06-25 DIAGNOSIS — I10 Essential (primary) hypertension: Secondary | ICD-10-CM | POA: Diagnosis not present

## 2019-06-25 DIAGNOSIS — F17210 Nicotine dependence, cigarettes, uncomplicated: Secondary | ICD-10-CM | POA: Diagnosis not present

## 2019-06-25 DIAGNOSIS — I2510 Atherosclerotic heart disease of native coronary artery without angina pectoris: Secondary | ICD-10-CM | POA: Insufficient documentation

## 2019-06-25 DIAGNOSIS — F10929 Alcohol use, unspecified with intoxication, unspecified: Secondary | ICD-10-CM | POA: Diagnosis not present

## 2019-06-25 DIAGNOSIS — R0989 Other specified symptoms and signs involving the circulatory and respiratory systems: Secondary | ICD-10-CM | POA: Insufficient documentation

## 2019-06-25 DIAGNOSIS — J189 Pneumonia, unspecified organism: Secondary | ICD-10-CM | POA: Diagnosis not present

## 2019-06-25 DIAGNOSIS — R4182 Altered mental status, unspecified: Secondary | ICD-10-CM | POA: Diagnosis present

## 2019-06-25 LAB — APTT: aPTT: 25 seconds (ref 24–36)

## 2019-06-25 LAB — COMPREHENSIVE METABOLIC PANEL
ALT: 15 U/L (ref 0–44)
AST: 19 U/L (ref 15–41)
Albumin: 3.4 g/dL — ABNORMAL LOW (ref 3.5–5.0)
Alkaline Phosphatase: 69 U/L (ref 38–126)
Anion gap: 11 (ref 5–15)
BUN: 12 mg/dL (ref 8–23)
CO2: 21 mmol/L — ABNORMAL LOW (ref 22–32)
Calcium: 8.1 mg/dL — ABNORMAL LOW (ref 8.9–10.3)
Chloride: 102 mmol/L (ref 98–111)
Creatinine, Ser: 0.83 mg/dL (ref 0.61–1.24)
GFR calc Af Amer: >60 mL/min (ref >60)
GFR calc non Af Amer: >60 mL/min (ref >60)
Glucose, Bld: 105 mg/dL — ABNORMAL HIGH (ref 70–99)
Potassium: 3.8 mmol/L (ref 3.5–5.1)
Sodium: 134 mmol/L — ABNORMAL LOW (ref 135–145)
Total Bilirubin: 0.5 mg/dL (ref 0.3–1.2)
Total Protein: 6.4 g/dL — ABNORMAL LOW (ref 6.5–8.1)

## 2019-06-25 LAB — I-STAT CHEM 8, ED
BUN: 12 mg/dL (ref 8–23)
Calcium, Ion: 0.98 mmol/L — ABNORMAL LOW (ref 1.15–1.40)
Chloride: 102 mmol/L (ref 98–111)
Creatinine, Ser: 1.2 mg/dL (ref 0.61–1.24)
Glucose, Bld: 97 mg/dL (ref 70–99)
HCT: 40 % (ref 39.0–52.0)
Hemoglobin: 13.6 g/dL (ref 13.0–17.0)
Potassium: 3.8 mmol/L (ref 3.5–5.1)
Sodium: 135 mmol/L (ref 135–145)
TCO2: 24 mmol/L (ref 22–32)

## 2019-06-25 LAB — CBC WITH DIFFERENTIAL/PLATELET
Abs Immature Granulocytes: 0.04 10*3/uL (ref 0.00–0.07)
Basophils Absolute: 0 10*3/uL (ref 0.0–0.1)
Basophils Relative: 0 %
Eosinophils Absolute: 0 10*3/uL (ref 0.0–0.5)
Eosinophils Relative: 0 %
HCT: 40.4 % (ref 39.0–52.0)
Hemoglobin: 12.7 g/dL — ABNORMAL LOW (ref 13.0–17.0)
Immature Granulocytes: 1 %
Lymphocytes Relative: 20 %
Lymphs Abs: 1.2 10*3/uL (ref 0.7–4.0)
MCH: 27.1 pg (ref 26.0–34.0)
MCHC: 31.4 g/dL (ref 30.0–36.0)
MCV: 86.3 fL (ref 80.0–100.0)
Monocytes Absolute: 0.6 10*3/uL (ref 0.1–1.0)
Monocytes Relative: 10 %
Neutro Abs: 4.2 10*3/uL (ref 1.7–7.7)
Neutrophils Relative %: 69 %
Platelets: 351 10*3/uL (ref 150–400)
RBC: 4.68 MIL/uL (ref 4.22–5.81)
RDW: 16.9 % — ABNORMAL HIGH (ref 11.5–15.5)
WBC: 6.1 10*3/uL (ref 4.0–10.5)
nRBC: 0 % (ref 0.0–0.2)

## 2019-06-25 LAB — CBG MONITORING, ED: Glucose-Capillary: 89 mg/dL (ref 70–99)

## 2019-06-25 LAB — LACTIC ACID, PLASMA: Lactic Acid, Venous: 1.9 mmol/L (ref 0.5–1.9)

## 2019-06-25 LAB — PROTIME-INR
INR: 0.9 (ref 0.8–1.2)
Prothrombin Time: 11.9 seconds (ref 11.4–15.2)

## 2019-06-25 LAB — ETHANOL: Alcohol, Ethyl (B): 389 mg/dL (ref <10)

## 2019-06-25 MED ORDER — THIAMINE HCL 100 MG/ML IJ SOLN
Freq: Once | INTRAVENOUS | Status: AC
  Administered 2019-06-25: via INTRAVENOUS
  Filled 2019-06-25: qty 1000

## 2019-06-25 NOTE — ED Triage Notes (Addendum)
Ems reports the pt was found outside of [facility], 80's on RA, per ems pt exhibits some expressive aphasia. Pt admits to having some alcohol. 120/76, hr 100, rr 18, 100%on Non-rebreather, cbg 89.

## 2019-06-25 NOTE — ED Notes (Addendum)
CBG Results of 89 Reported to Pella, RN.

## 2019-06-25 NOTE — ED Notes (Signed)
Pt reported that he had only 1 40oz beer.

## 2019-06-26 ENCOUNTER — Emergency Department (HOSPITAL_COMMUNITY): Payer: Medicaid Other

## 2019-06-26 LAB — URINALYSIS, ROUTINE W REFLEX MICROSCOPIC
Bilirubin Urine: NEGATIVE
Glucose, UA: NEGATIVE mg/dL
Hgb urine dipstick: NEGATIVE
Ketones, ur: NEGATIVE mg/dL
Leukocytes,Ua: NEGATIVE
Nitrite: NEGATIVE
Protein, ur: NEGATIVE mg/dL
Specific Gravity, Urine: 1.004 — ABNORMAL LOW (ref 1.005–1.030)
pH: 6 (ref 5.0–8.0)

## 2019-06-26 LAB — RAPID URINE DRUG SCREEN, HOSP PERFORMED
Amphetamines: NOT DETECTED
Barbiturates: NOT DETECTED
Benzodiazepines: NOT DETECTED
Cocaine: NOT DETECTED
Opiates: NOT DETECTED
Tetrahydrocannabinol: NOT DETECTED

## 2019-06-26 LAB — LACTIC ACID, PLASMA: Lactic Acid, Venous: 1.8 mmol/L (ref 0.5–1.9)

## 2019-06-26 LAB — SARS CORONAVIRUS 2 (TAT 6-24 HRS): SARS Coronavirus 2: NEGATIVE

## 2019-06-26 MED ORDER — DEXAMETHASONE 4 MG PO TABS
10.0000 mg | ORAL_TABLET | Freq: Once | ORAL | Status: AC
  Administered 2019-06-26: 10 mg via ORAL
  Filled 2019-06-26: qty 3

## 2019-06-26 MED ORDER — DOXYCYCLINE HYCLATE 100 MG PO CAPS: 100.0000 mg | ORAL_CAPSULE | Freq: Two times a day (BID) | ORAL | 0 refills | Status: DC

## 2019-06-26 MED ORDER — ALBUTEROL SULFATE HFA 108 (90 BASE) MCG/ACT IN AERS
2.0000 | INHALATION_SPRAY | Freq: Once | RESPIRATORY_TRACT | Status: AC
  Administered 2019-06-26: 2 via RESPIRATORY_TRACT
  Filled 2019-06-26: qty 6.7

## 2019-06-26 NOTE — ED Provider Notes (Signed)
6:56 AM  Patient observed overnight.  In brief, found down outside shelter.  Notably intoxicated.  On initial physical exam was not in any respiratory distress but noted to have rales.  He had a full work-up.  Chest x-ray suggestive of possible infiltrative process right lower lobe.  He is afebrile.  He does have a history of smoking.  Patient was given albuterol and 1 dose of Decadron.  Covid testing is pending.  Will treat for community-acquired pneumonia given infiltrate on chest x-ray.  Will discharge with doxycycline.  On my evaluation, patient is easily arousable.  He has been ambulatory in the hallway.  After history, exam, and medical workup I feel the patient has been appropriately medically screened and is safe for discharge home. Pertinent diagnoses were discussed with the patient. Patient was given return precautions.  Samuel Reyes was evaluated in Emergency Department on 06/26/2019 for the symptoms described in the history of present illness. He was evaluated in the context of the global COVID-19 pandemic, which necessitated consideration that the patient might be at risk for infection with the SARS-CoV-2 virus that causes COVID-19. Institutional protocols and algorithms that pertain to the evaluation of patients at risk for COVID-19 are in a state of rapid change based on information released by regulatory bodies including the CDC and federal and state organizations. These policies and algorithms were followed during the patient's care in the ED.    Merryl Hacker, MD 06/26/19 [phone]

## 2019-06-26 NOTE — ED Notes (Signed)
RN walked pt in hallway; Pt was slightly unsteady and c/o of dizziness and SOB; Pt had audible wheezes but sats stayed at 96% RA;-Monique,RN

## 2019-06-26 NOTE — ED Provider Notes (Signed)
[facility] EMERGENCY DEPARTMENT Provider Note   CSN: [id number] Arrival date & time: 06/25/19  2036     History   Chief Complaint Chief Complaint  Patient presents with  . Altered Mental Status    HPI Samuel Reyes is a 63 y.o. male.     HPI Patient was found outside of the homeless shelter very poorly responsive.  EMS was called.  No additional history is available.  Patient admits to having alcohol consumption.  EMS thought patient might have expressive aphasia.  Blood pressures and heart rate stable for transport.  Reportedly he was at 27s on room air at that time and transferred with oxygen.  Patient is not giving any additional history aside to endorse that he did drink alcohol. Past Medical History:  Diagnosis Date  . Alcoholism ([place])   . Barrett's esophagus   . CAD (coronary artery disease) 2002   MI, no intervention required  . COPD (chronic obstructive pulmonary disease) (HCC)    not on home O2  . Depression   . DTs (delirium tremens) ([place])   . GERD (gastroesophageal reflux disease)   . Headache   . Homeless   . Hypertension   . Stroke [facility]) 2006    Patient Active Problem List   Diagnosis Date Noted  . Pressure injury of skin 03/25/2019  . Leukocytosis 03/25/2019  . Thrombocytosis ([place]) 03/25/2019  . Sunburn 03/25/2019  . Epigastric abdominal pain 03/25/2019  . QT prolongation 09/09/2018  . Overdose 09/06/2018  . Substance induced mood disorder ([place]) 07/25/2018  . MDD (major depressive disorder), recurrent episode, severe ([place]) 07/18/2018  . MDD (major depressive disorder), recurrent severe, without psychosis ([place]) 08/26/2017  . Alcohol withdrawal ([place]) 08/08/2017  . COPD with acute bronchitis ([place]) 07/29/2017  . Alcohol use   . Homelessness 06/24/2017  . Lung nodule 09/03/2016  . Tobacco abuse 09/03/2016  . Insomnia 09/03/2016  . HCAP (healthcare-associated pneumonia) 08/22/2016  . Bronchiolitis 08/21/2016  . Malnutrition of  moderate degree 08/04/2016  . Atrial tachycardia ([place]) 08/02/2016  . Impaired glucose tolerance 08/02/2016  . Alcohol use with alcohol-induced mood disorder ([place]) 12/12/2015  . Syncope 11/15/2015  . Chest pain 11/15/2015  . Nausea and vomiting 11/15/2015  . Abdominal pain 11/15/2015  . Major depressive disorder, recurrent episode, moderate with anxious distress ([place]) 10/30/2015  . Alcohol use disorder, severe, dependence ([place]) 10/29/2015  . COPD exacerbation ([name]) 09/20/2015  . Acute respiratory failure with hypoxia ([place]) 09/18/2015  . Suicidal ideation 09/17/2015  . Alcohol intoxication ([place]) 09/17/2015  . Depression 09/17/2015  . Benign essential HTN 09/17/2015  . Hypokalemia 09/17/2015  . Hyponatremia 09/17/2015  . Coffee ground emesis 09/17/2015  . COPD (chronic obstructive pulmonary disease) ([place]) 02/17/2013  . GERD (gastroesophageal reflux disease) 02/17/2013    Past Surgical History:  Procedure Laterality Date  . BACK SURGERY     3 cervical spine surgeries C4-C5 fused  . COLONOSCOPY N/A 01/04/2014   Procedure: COLONOSCOPY;  Surgeon: Danie Binder, MD;  Location: [facility] ENDO SUITE;  Service: Endoscopy;  Laterality: N/A;  1:45  . ESOPHAGOGASTRODUODENOSCOPY N/A 01/04/2014   Procedure: ESOPHAGOGASTRODUODENOSCOPY (EGD);  Surgeon: Danie Binder, MD;  Location: [facility] ENDO SUITE;  Service: Endoscopy;  Laterality: N/A;  . FINGER SURGERY Left    2nd, 3rd, & 4th fingers were cut off by table saw and reattached  . GASTRECTOMY    . HERNIA REPAIR    . INCISIONAL HERNIA REPAIR N/A 01/20/2014   Procedure: LAPAROSCOPIC RECURRENT  INCISIONAL HERNIA  with mesh;  Surgeon: Edward Jolly, MD;  Location: [facility] ORS;  Service: General;  Laterality: N/A;  . rt knee arthroscopic surgery    . SHOULDER SURGERY Bilateral    3 surgeries on on left, 2 surgeries on right         Home Medications    Prior to Admission medications   Medication Sig Start Date End Date Taking? Authorizing Provider   albuterol (VENTOLIN HFA) 108 (90 Base) MCG/ACT inhaler Inhale 2 puffs into the lungs every 4 (four) hours as needed for wheezing or shortness of breath. 01/15/19   Ladell Pier, MD  gabapentin (NEURONTIN) 300 MG capsule Take 1 capsule (300 mg total) by mouth 3 (three) times daily. 06/17/19   Rankin, Shuvon B, NP  hydrOXYzine (ATARAX/VISTARIL) 25 MG tablet Take 1 tablet (25 mg total) by mouth 3 (three) times daily as needed for anxiety. 06/17/19   Rankin, Shuvon B, NP  Multiple Vitamin (MULTIVITAMIN WITH MINERALS) TABS tablet Take 1 tablet by mouth daily. 06/18/19   Rankin, Shuvon B, NP  sertraline (ZOLOFT) 25 MG tablet Take 1 tablet (25 mg total) by mouth daily. 06/18/19   Rankin, Shuvon B, NP  traZODone (DESYREL) 50 MG tablet Take 1 tablet (50 mg total) by mouth at bedtime as needed for sleep. 06/17/19   Rankin, Shuvon B, NP    Family History Family History  Problem Relation Age of Onset  . Cancer Father        bone  . Cancer Brother        lungs  . Stroke Maternal Grandmother   . Asthma Son        died at age 34 in his sleep   . Spina bifida Son        died at age 64   . Dementia Mother   . Colon cancer Neg Hx     Social History Social History   Tobacco Use  . Smoking status: Current Every Day Smoker    Packs/day: 1.00    Years: 52.00    Pack years: 52.00    Types: Cigarettes    Start date: 07/30/1966  . Smokeless tobacco: Never Used  . Tobacco comment: uses nicotine patch  Substance Use Topics  . Alcohol use: Yes    Alcohol/week: 15.0 standard drinks    Types: 15 Cans of beer per week    Comment: last drink 2 days ago  . Drug use: No    Comment: denied using any drugs     Allergies   Bee venom, Penicillins, and Vancomycin   Review of Systems Review of Systems Level 5 caveat cannot obtain review of systems due to patient condition. Physical Exam Updated Vital Signs BP 100/68   Pulse 96   Temp (!) 97.4 F (36.3 C) (Oral)   Resp (!) 32   Ht 5\' 2"  (1.575  m)   Wt 68 kg   SpO2 95%   BMI 27.44 kg/m   Physical Exam Constitutional:      Comments: Patient smells of alcohol and is disheveled.  He does not have respiratory distress at rest.  He is very somnolent.  HENT:     Head: Normocephalic and atraumatic.  Eyes:     Extraocular Movements: Extraocular movements intact.     Pupils: Pupils are equal, round, and reactive to light.  Neck:     Musculoskeletal: Neck supple.  Cardiovascular:     Rate and Rhythm: Normal rate and regular rhythm.  Pulmonary:  Comments: Coarse rhonchi bilateral anterior chest.  Occasional cough.  No respiratory distress at rest. Abdominal:     General: There is no distension.     Palpations: Abdomen is soft.     Tenderness: There is no abdominal tenderness. There is no guarding.  Musculoskeletal: Normal range of motion.     Comments: Patient will spontaneously move extremities as he is being examined and garments being removed.  He is moving both upper and lower extremities purposely.  No extremity deformities.  Trace peripheral edema at the ankles and lower legs.  No wounds of the feet or lower legs.  Skin:    General: Skin is warm and dry.  Neurological:     Comments: Patient is very somnolent.  He will make occasional one-word responses.  He does purposefully move all 4 extremities when being undressed and moved around.  There does not appear to be any focal deficit.      ED Treatments / Results  Labs (all labs ordered are listed, but only abnormal results are displayed) Labs Reviewed  CBC WITH DIFFERENTIAL/PLATELET - Abnormal; Notable for the following components:      Result Value   Hemoglobin 12.7 (*)    RDW 16.9 (*)    All other components within normal limits  COMPREHENSIVE METABOLIC PANEL - Abnormal; Notable for the following components:   Sodium 134 (*)    CO2 21 (*)    Glucose, Bld 105 (*)    Calcium 8.1 (*)    Total Protein 6.4 (*)    Albumin 3.4 (*)    All other components within  normal limits  ETHANOL - Abnormal; Notable for the following components:   Alcohol, Ethyl (B) 389 (*)    All other components within normal limits  I-STAT CHEM 8, ED - Abnormal; Notable for the following components:   Calcium, Ion 0.98 (*)    All other components within normal limits  LACTIC ACID, PLASMA  APTT  PROTIME-INR  LACTIC ACID, PLASMA  CBG MONITORING, ED  I-STAT CREATININE, ED    EKG EKG Interpretation  Date/Time:  Thursday June 25 2019 20:46:06 EST Ventricular Rate:  92 PR Interval:    QRS Duration: 97 QT Interval:  374 QTC Calculation: 463 R Axis:   69 Text Interpretation: Sinus rhythm Abnormal R-wave progression, early transition T waves more peaked than first previous tracing but similar to older tracings Confirmed by Charlesetta Shanks [phone]) on 06/25/2019 11:18:23 PM   Radiology No results found.  Procedures Procedures (including critical care time)  Medications Ordered in ED Medications  sodium chloride 0.9 % 1,000 mL with thiamine 123XX123 mg, folic acid 1 mg, multivitamins adult 10 mL infusion ( Intravenous New Bag/Given 06/25/19 2355)     Initial Impression / Assessment and Plan / ED Course  I have reviewed the triage vital signs and the nursing notes.  Pertinent labs & imaging results that were available during my care of the patient were reviewed by me and considered in my medical decision making (see chart for details).        Patient brought for evaluation of being poorly responsive found on the ground.  Patient reportedly is homeless and was outside the shelter.  Alcohol level is significantly elevated.  With stimulus patient will move all 4 extremities.  At this time do not have high suspicion for CVA.  We will proceed with CT scan of the head and diagnostic evaluation.  Patient will require further observation for resolution of intoxication.  At  this time, he is not exhibiting significant hypoxia.  He is asleep in the stretcher with a 2 L nasal  cannula down by his mouth.  Patient saturations are 94 to 98%.  Color is good.  He does not exhibit respiratory distress at rest.  On exam however he does have coarse rhonchi.  Will have chest x-ray.  Dr. Dina Rich will follow up on diagnostic results and observe patient for resolution of intoxication.  Final Clinical Impressions(s) / ED Diagnoses   Final diagnoses:  Stupor  Alcoholic intoxication with complication [facility])    ED Discharge Orders    None       Charlesetta Shanks, MD 06/26/19 [phone]

## 2019-06-29 MED FILL — DOXYCYCLINE HYCLATE 100 MG: 100 | 10 days supply | Qty: 20 | Fill #0

## 2019-07-08 ENCOUNTER — Emergency Department (HOSPITAL_COMMUNITY): Payer: Medicaid Other

## 2019-07-08 ENCOUNTER — Emergency Department (HOSPITAL_COMMUNITY)
Admission: EM | Admit: 2019-07-08 | Discharge: 2019-07-08 | Disposition: A | Discharge status: Home / Self Care | Payer: Medicaid Other | Attending: Emergency Medicine | Admitting: Emergency Medicine

## 2019-07-08 DIAGNOSIS — I252 Old myocardial infarction: Secondary | ICD-10-CM | POA: Diagnosis not present

## 2019-07-08 DIAGNOSIS — F10229 Alcohol dependence with intoxication, unspecified: Secondary | ICD-10-CM | POA: Diagnosis not present

## 2019-07-08 DIAGNOSIS — Y905 Blood alcohol level of 100-119 mg/100 ml: Secondary | ICD-10-CM | POA: Diagnosis not present

## 2019-07-08 DIAGNOSIS — J449 Chronic obstructive pulmonary disease, unspecified: Secondary | ICD-10-CM | POA: Insufficient documentation

## 2019-07-08 DIAGNOSIS — F17210 Nicotine dependence, cigarettes, uncomplicated: Secondary | ICD-10-CM | POA: Diagnosis not present

## 2019-07-08 DIAGNOSIS — I2510 Atherosclerotic heart disease of native coronary artery without angina pectoris: Secondary | ICD-10-CM | POA: Insufficient documentation

## 2019-07-08 DIAGNOSIS — R05 Cough: Secondary | ICD-10-CM | POA: Insufficient documentation

## 2019-07-08 DIAGNOSIS — Z20828 Contact with and (suspected) exposure to other viral communicable diseases: Secondary | ICD-10-CM | POA: Insufficient documentation

## 2019-07-08 DIAGNOSIS — R509 Fever, unspecified: Secondary | ICD-10-CM | POA: Insufficient documentation

## 2019-07-08 DIAGNOSIS — R0602 Shortness of breath: Secondary | ICD-10-CM | POA: Diagnosis not present

## 2019-07-08 DIAGNOSIS — R059 Cough, unspecified: Secondary | ICD-10-CM

## 2019-07-08 LAB — COMPREHENSIVE METABOLIC PANEL
ALT: 23 U/L (ref 0–44)
AST: 24 U/L (ref 15–41)
Albumin: 3.7 g/dL (ref 3.5–5.0)
Alkaline Phosphatase: 77 U/L (ref 38–126)
Anion gap: 12 (ref 5–15)
BUN: 9 mg/dL (ref 8–23)
CO2: 27 mmol/L (ref 22–32)
Calcium: 8.6 mg/dL — ABNORMAL LOW (ref 8.9–10.3)
Chloride: 98 mmol/L (ref 98–111)
Creatinine, Ser: 0.66 mg/dL (ref 0.61–1.24)
GFR calc Af Amer: >60 mL/min (ref >60)
GFR calc non Af Amer: >60 mL/min (ref >60)
Glucose, Bld: 95 mg/dL (ref 70–99)
Potassium: 4 mmol/L (ref 3.5–5.1)
Sodium: 137 mmol/L (ref 135–145)
Total Bilirubin: 0.3 mg/dL (ref 0.3–1.2)
Total Protein: 7.1 g/dL (ref 6.5–8.1)

## 2019-07-08 LAB — CBC WITH DIFFERENTIAL/PLATELET
Abs Immature Granulocytes: 0.1 10*3/uL — ABNORMAL HIGH (ref 0.00–0.07)
Basophils Absolute: 0 10*3/uL (ref 0.0–0.1)
Basophils Relative: 1 %
Eosinophils Absolute: 0 10*3/uL (ref 0.0–0.5)
Eosinophils Relative: 0 %
HCT: 44.1 % (ref 39.0–52.0)
Hemoglobin: 13.9 g/dL (ref 13.0–17.0)
Immature Granulocytes: 1 %
Lymphocytes Relative: 23 %
Lymphs Abs: 1.7 10*3/uL (ref 0.7–4.0)
MCH: 27.1 pg (ref 26.0–34.0)
MCHC: 31.5 g/dL (ref 30.0–36.0)
MCV: 86 fL (ref 80.0–100.0)
Monocytes Absolute: 0.8 10*3/uL (ref 0.1–1.0)
Monocytes Relative: 10 %
Neutro Abs: 4.8 10*3/uL (ref 1.7–7.7)
Neutrophils Relative %: 65 %
Platelets: 342 10*3/uL (ref 150–400)
RBC: 5.13 MIL/uL (ref 4.22–5.81)
RDW: 17.2 % — ABNORMAL HIGH (ref 11.5–15.5)
WBC: 7.5 10*3/uL (ref 4.0–10.5)
nRBC: 0 % (ref 0.0–0.2)

## 2019-07-08 LAB — BRAIN NATRIURETIC PEPTIDE: B Natriuretic Peptide: 34.5 pg/mL (ref 0.0–100.0)

## 2019-07-08 LAB — LIPASE, BLOOD: Lipase: 27 U/L (ref 11–51)

## 2019-07-08 LAB — ETHANOL: Alcohol, Ethyl (B): 119 mg/dL — ABNORMAL HIGH (ref <10)

## 2019-07-08 LAB — POC SARS CORONAVIRUS 2 AG -  ED: SARS Coronavirus 2 Ag: NEGATIVE

## 2019-07-08 MED ORDER — SODIUM CHLORIDE 0.9 % IV SOLN: INTRAVENOUS | Status: DC

## 2019-07-08 MED ORDER — SODIUM CHLORIDE 0.9 % IV SOLN
INTRAVENOUS | Status: DC
  Administered 2019-07-08: 13:00:00 via INTRAVENOUS

## 2019-07-08 NOTE — ED Triage Notes (Signed)
Pt BIB [facility] from a hotel. Pt CAOx4. Hx CHF. Pt c/o fever, chills, productive cough, SOB and HA that started 3 days ago. Pt O2 98 on RA. Pt reports no being able to keep any food down for 3 days as well. He attempted to keep a beer down yesterday but was unsuccessful.

## 2019-07-08 NOTE — ED Provider Notes (Signed)
[facility] DEPT Provider Note   CSN: [id number] Arrival date & time: 07/08/19  1139     History   Chief Complaint Chief Complaint  Patient presents with  . Fever  . Cough  . Shortness of Breath    HPI Samuel Reyes is a 63 y.o. male.     63 year old male with history of alcohol abuse as well as CHF, COPD presents with 3 days of cough and shortness of breath.  Patient describes subjective fevers and chills but has had some myalgias.  He has been noncompliant with his medications for several weeks.  Denies any chest pain.  No lower extremity edema.  Has had some watery diarrhea with nausea but no vomiting.  He attempted to drink a beer yesterday but was unsuccessful.  Denies any associate abdominal pain.  Called EMS and was transported here.     Past Medical History:  Diagnosis Date  . Alcoholism ([place])   . Barrett's esophagus   . CAD (coronary artery disease) 2002   MI, no intervention required  . COPD (chronic obstructive pulmonary disease) (HCC)    not on home O2  . Depression   . DTs (delirium tremens) ([place])   . GERD (gastroesophageal reflux disease)   . Headache   . Homeless   . Hypertension   . Stroke [facility]) 2006    Patient Active Problem List   Diagnosis Date Noted  . Pressure injury of skin 03/25/2019  . Leukocytosis 03/25/2019  . Thrombocytosis ([place]) 03/25/2019  . Sunburn 03/25/2019  . Epigastric abdominal pain 03/25/2019  . QT prolongation 09/09/2018  . Overdose 09/06/2018  . Substance induced mood disorder ([place]) 07/25/2018  . MDD (major depressive disorder), recurrent episode, severe ([place]) 07/18/2018  . MDD (major depressive disorder), recurrent severe, without psychosis ([place]) 08/26/2017  . Alcohol withdrawal ([place]) 08/08/2017  . COPD with acute bronchitis ([place]) 07/29/2017  . Alcohol use   . Homelessness 06/24/2017  . Lung nodule 09/03/2016  . Tobacco abuse 09/03/2016  . Insomnia 09/03/2016  . HCAP  (healthcare-associated pneumonia) 08/22/2016  . Bronchiolitis 08/21/2016  . Malnutrition of moderate degree 08/04/2016  . Atrial tachycardia ([place]) 08/02/2016  . Impaired glucose tolerance 08/02/2016  . Alcohol use with alcohol-induced mood disorder ([place]) 12/12/2015  . Syncope 11/15/2015  . Chest pain 11/15/2015  . Nausea and vomiting 11/15/2015  . Abdominal pain 11/15/2015  . Major depressive disorder, recurrent episode, moderate with anxious distress ([place]) 10/30/2015  . Alcohol use disorder, severe, dependence ([place]) 10/29/2015  . COPD exacerbation ([place]) 09/20/2015  . Acute respiratory failure with hypoxia ([place]) 09/18/2015  . Suicidal ideation 09/17/2015  . Alcohol intoxication ([place]) 09/17/2015  . Depression 09/17/2015  . Benign essential HTN 09/17/2015  . Hypokalemia 09/17/2015  . Hyponatremia 09/17/2015  . Coffee ground emesis 09/17/2015  . COPD (chronic obstructive pulmonary disease) ([place]) 02/17/2013  . GERD (gastroesophageal reflux disease) 02/17/2013    Past Surgical History:  Procedure Laterality Date  . BACK SURGERY     3 cervical spine surgeries C4-C5 fused  . COLONOSCOPY N/A 01/04/2014   Procedure: COLONOSCOPY;  Surgeon: Danie Binder, MD;  [facility];  Service: Endoscopy;  Laterality: N/A;  1:45  . ESOPHAGOGASTRODUODENOSCOPY N/A 01/04/2014   Procedure: ESOPHAGOGASTRODUODENOSCOPY (EGD);  Surgeon: Danie Binder, MD;  Location: [facility];  Service: Endoscopy;  Laterality: N/A;  . FINGER SURGERY Left    2nd, 3rd, & 4th fingers were cut off by table saw and reattached  . GASTRECTOMY    .  HERNIA REPAIR    . INCISIONAL HERNIA REPAIR N/A 01/20/2014   Procedure: LAPAROSCOPIC RECURRENT  INCISIONAL HERNIA with mesh;  Surgeon: Edward Jolly, MD;  Location: [facility] ORS;  Service: General;  Laterality: N/A;  . rt knee arthroscopic surgery    . SHOULDER SURGERY Bilateral    3 surgeries on on left, 2 surgeries on right         Home Medications    Prior to  Admission medications   Medication Sig Start Date End Date Taking? Authorizing Provider  albuterol (VENTOLIN HFA) 108 (90 Base) MCG/ACT inhaler Inhale 2 puffs into the lungs every 4 (four) hours as needed for wheezing or shortness of breath. Patient not taking: Reported on 06/26/2019 01/15/19   Ladell Pier, MD  doxycycline (VIBRAMYCIN) 100 MG capsule Take 1 capsule (100 mg total) by mouth 2 (two) times daily. 06/26/19   Horton, Barbette Hair, MD  gabapentin (NEURONTIN) 300 MG capsule Take 1 capsule (300 mg total) by mouth 3 (three) times daily. Patient not taking: Reported on 06/26/2019 06/17/19   Rankin, Shuvon B, NP  hydrOXYzine (ATARAX/VISTARIL) 25 MG tablet Take 1 tablet (25 mg total) by mouth 3 (three) times daily as needed for anxiety. Patient not taking: Reported on 06/26/2019 06/17/19   Rankin, Shuvon B, NP  Multiple Vitamin (MULTIVITAMIN WITH MINERALS) TABS tablet Take 1 tablet by mouth daily. Patient not taking: Reported on 06/26/2019 06/18/19   Rankin, Shuvon B, NP  sertraline (ZOLOFT) 25 MG tablet Take 1 tablet (25 mg total) by mouth daily. Patient not taking: Reported on 06/26/2019 06/18/19   Rankin, Shuvon B, NP  traZODone (DESYREL) 50 MG tablet Take 1 tablet (50 mg total) by mouth at bedtime as needed for sleep. Patient not taking: Reported on 06/26/2019 06/17/19   Rankin, Delphia Grates B, NP    Family History Family History  Problem Relation Age of Onset  . Cancer Father        bone  . Cancer Brother        lungs  . Stroke Maternal Grandmother   . Asthma Son        died at age 22 in his sleep   . Spina bifida Son        died at age 46   . Dementia Mother   . Colon cancer Neg Hx     Social History Social History   Tobacco Use  . Smoking status: Current Every Day Smoker    Packs/day: 1.00    Years: 52.00    Pack years: 52.00    Types: Cigarettes    Start date: 07/30/1966  . Smokeless tobacco: Never Used  . Tobacco comment: uses nicotine patch  Substance Use Topics   . Alcohol use: Yes    Alcohol/week: 15.0 standard drinks    Types: 15 Cans of beer per week    Comment: last drink 2 days ago  . Drug use: No    Comment: denied using any drugs     Allergies   Bee venom, Penicillins, and Vancomycin   Review of Systems Review of Systems  All other systems reviewed and are negative.    Physical Exam Updated Vital Signs BP (!) 145/87 (BP Location: Left Arm)   Pulse (!) 108   Temp 99 F (37.2 C) (Oral)   Resp 20   SpO2 98%   Physical Exam Vitals signs and nursing note reviewed.  Constitutional:      General: He is not in acute distress.  Appearance: Normal appearance. He is well-developed. He is not toxic-appearing.  HENT:     Head: Normocephalic and atraumatic.  Eyes:     General: Lids are normal.     Conjunctiva/sclera: Conjunctivae normal.     Pupils: Pupils are equal, round, and reactive to light.  Neck:     Musculoskeletal: Normal range of motion and neck supple.     Thyroid: No thyroid mass.     Trachea: No tracheal deviation.  Cardiovascular:     Rate and Rhythm: Normal rate and regular rhythm.     Heart sounds: Normal heart sounds. No murmur. No gallop.   Pulmonary:     Effort: Pulmonary effort is normal. No respiratory distress.     Breath sounds: No stridor. Decreased breath sounds present. No wheezing, rhonchi or rales.  Abdominal:     General: Bowel sounds are normal. There is no distension.     Palpations: Abdomen is soft.     Tenderness: There is no abdominal tenderness. There is no rebound.  Musculoskeletal: Normal range of motion.        General: No tenderness.  Skin:    General: Skin is warm and dry.     Findings: No abrasion or rash.  Neurological:     Mental Status: He is alert and oriented to person, place, and time.     GCS: GCS eye subscore is 4. GCS verbal subscore is 5. GCS motor subscore is 6.     Cranial Nerves: No cranial nerve deficit.     Sensory: No sensory deficit.  Psychiatric:         Speech: Speech normal.        Behavior: Behavior normal.      ED Treatments / Results  Labs (all labs ordered are listed, but only abnormal results are displayed) Labs Reviewed  BRAIN NATRIURETIC PEPTIDE  CBC WITH DIFFERENTIAL/PLATELET  COMPREHENSIVE METABOLIC PANEL  RAPID URINE DRUG SCREEN, HOSP PERFORMED  ETHANOL  POC SARS CORONAVIRUS 2 AG -  ED    EKG None  Radiology No results found.  Procedures Procedures (including critical care time)  Medications Ordered in ED Medications  0.9 %  sodium chloride infusion (has no administration in time range)     Initial Impression / Assessment and Plan / ED Course  I have reviewed the triage vital signs and the nursing notes.  Pertinent labs & imaging results that were available during my care of the patient were reviewed by me and considered in my medical decision making (see chart for details).        Patient's labs are reassuring including negative point-of-care Covid test.  He is afebrile here.  Alcohol level is elevated at 118.  Chest x-ray without acute findings.  Will discharge home  Final Clinical Impressions(s) / ED Diagnoses   Final diagnoses:  None    ED Discharge Orders    None       Lacretia Leigh, MD 07/08/19 1341

## 2019-07-15 ENCOUNTER — Encounter: Payer: Self-pay | Admitting: *Deleted

## 2019-07-15 ENCOUNTER — Encounter (INDEPENDENT_AMBULATORY_CARE_PROVIDER_SITE_OTHER): Payer: Self-pay | Admitting: Primary Care

## 2019-07-15 ENCOUNTER — Other Ambulatory Visit: Payer: Self-pay

## 2019-07-15 ENCOUNTER — Emergency Department (HOSPITAL_COMMUNITY): Payer: Medicaid Other

## 2019-07-15 ENCOUNTER — Ambulatory Visit (INDEPENDENT_AMBULATORY_CARE_PROVIDER_SITE_OTHER): Payer: Medicaid Other | Admitting: Primary Care

## 2019-07-15 ENCOUNTER — Encounter (HOSPITAL_COMMUNITY): Payer: Self-pay | Admitting: Emergency Medicine

## 2019-07-15 ENCOUNTER — Emergency Department (HOSPITAL_COMMUNITY)
Admission: EM | Admit: 2019-07-15 | Discharge: 2019-07-15 | Disposition: A | Discharge status: Home / Self Care | Payer: Medicaid Other | Attending: Emergency Medicine | Admitting: Emergency Medicine

## 2019-07-15 DIAGNOSIS — R05 Cough: Secondary | ICD-10-CM

## 2019-07-15 DIAGNOSIS — R519 Headache, unspecified: Secondary | ICD-10-CM

## 2019-07-15 DIAGNOSIS — R0602 Shortness of breath: Secondary | ICD-10-CM | POA: Diagnosis present

## 2019-07-15 DIAGNOSIS — Z7289 Other problems related to lifestyle: Secondary | ICD-10-CM | POA: Diagnosis not present

## 2019-07-15 DIAGNOSIS — R438 Other disturbances of smell and taste: Secondary | ICD-10-CM

## 2019-07-15 DIAGNOSIS — Z720 Tobacco use: Secondary | ICD-10-CM | POA: Diagnosis not present

## 2019-07-15 DIAGNOSIS — I10 Essential (primary) hypertension: Secondary | ICD-10-CM | POA: Diagnosis not present

## 2019-07-15 DIAGNOSIS — J441 Chronic obstructive pulmonary disease with (acute) exacerbation: Secondary | ICD-10-CM | POA: Insufficient documentation

## 2019-07-15 DIAGNOSIS — Z5900 Homelessness unspecified: Secondary | ICD-10-CM

## 2019-07-15 DIAGNOSIS — I2510 Atherosclerotic heart disease of native coronary artery without angina pectoris: Secondary | ICD-10-CM | POA: Insufficient documentation

## 2019-07-15 DIAGNOSIS — Z20822 Contact with and (suspected) exposure to covid-19: Secondary | ICD-10-CM

## 2019-07-15 DIAGNOSIS — Z79899 Other long term (current) drug therapy: Secondary | ICD-10-CM | POA: Diagnosis not present

## 2019-07-15 DIAGNOSIS — Z03818 Encounter for observation for suspected exposure to other biological agents ruled out: Secondary | ICD-10-CM

## 2019-07-15 DIAGNOSIS — R509 Fever, unspecified: Secondary | ICD-10-CM

## 2019-07-15 DIAGNOSIS — F17210 Nicotine dependence, cigarettes, uncomplicated: Secondary | ICD-10-CM | POA: Insufficient documentation

## 2019-07-15 DIAGNOSIS — Z789 Other specified health status: Secondary | ICD-10-CM

## 2019-07-15 LAB — CBC
HCT: 39 % (ref 39.0–52.0)
Hemoglobin: 12.2 g/dL — ABNORMAL LOW (ref 13.0–17.0)
MCH: 27.2 pg (ref 26.0–34.0)
MCHC: 31.3 g/dL (ref 30.0–36.0)
MCV: 86.9 fL (ref 80.0–100.0)
Platelets: 341 10*3/uL (ref 150–400)
RBC: 4.49 MIL/uL (ref 4.22–5.81)
RDW: 17.3 % — ABNORMAL HIGH (ref 11.5–15.5)
WBC: 8 10*3/uL (ref 4.0–10.5)
nRBC: 0 % (ref 0.0–0.2)

## 2019-07-15 LAB — COMPREHENSIVE METABOLIC PANEL
ALT: 22 U/L (ref 0–44)
AST: 24 U/L (ref 15–41)
Albumin: 3.3 g/dL — ABNORMAL LOW (ref 3.5–5.0)
Alkaline Phosphatase: 64 U/L (ref 38–126)
Anion gap: 12 (ref 5–15)
BUN: 9 mg/dL (ref 8–23)
CO2: 24 mmol/L (ref 22–32)
Calcium: 8.8 mg/dL — ABNORMAL LOW (ref 8.9–10.3)
Chloride: 101 mmol/L (ref 98–111)
Creatinine, Ser: 0.84 mg/dL (ref 0.61–1.24)
GFR calc Af Amer: >60 mL/min (ref >60)
GFR calc non Af Amer: >60 mL/min (ref >60)
Glucose, Bld: 124 mg/dL — ABNORMAL HIGH (ref 70–99)
Potassium: 3.6 mmol/L (ref 3.5–5.1)
Sodium: 137 mmol/L (ref 135–145)
Total Bilirubin: 0.4 mg/dL (ref 0.3–1.2)
Total Protein: 6.5 g/dL (ref 6.5–8.1)

## 2019-07-15 LAB — TROPONIN I (HIGH SENSITIVITY): Troponin I (High Sensitivity): 10 ng/L (ref <18)

## 2019-07-15 MED ORDER — HYDROXYZINE HCL 25 MG PO TABS: 25.0000 mg | ORAL_TABLET | Freq: Three times a day (TID) | ORAL | 1 refills | Status: DC | PRN

## 2019-07-15 MED ORDER — GABAPENTIN 300 MG PO CAPS: 300.0000 mg | ORAL_CAPSULE | Freq: Three times a day (TID) | ORAL | 0 refills | Status: DC

## 2019-07-15 MED ORDER — ALBUTEROL SULFATE HFA 108 (90 BASE) MCG/ACT IN AERS
4.0000 | INHALATION_SPRAY | Freq: Once | RESPIRATORY_TRACT | Status: AC
  Administered 2019-07-15: 4 via RESPIRATORY_TRACT
  Filled 2019-07-15: qty 6.7

## 2019-07-15 MED ORDER — DEXAMETHASONE 4 MG PO TABS
6.0000 mg | ORAL_TABLET | Freq: Once | ORAL | Status: AC
  Administered 2019-07-15: 6 mg via ORAL
  Filled 2019-07-15: qty 2

## 2019-07-15 MED ORDER — SERTRALINE HCL 25 MG PO TABS: 25.0000 mg | ORAL_TABLET | Freq: Every day | ORAL | 1 refills | Status: DC

## 2019-07-15 MED ORDER — ALBUTEROL SULFATE HFA 108 (90 BASE) MCG/ACT IN AERS: 2.0000 | INHALATION_SPRAY | RESPIRATORY_TRACT | 1 refills | Status: DC | PRN

## 2019-07-15 MED ORDER — TRAZODONE HCL 50 MG PO TABS: 50.0000 mg | ORAL_TABLET | Freq: Every evening | ORAL | 1 refills | Status: DC | PRN

## 2019-07-15 MED ORDER — BUDESONIDE-FORMOTEROL FUMARATE 160-4.5 MCG/ACT IN AERO: 2.0000 | INHALATION_SPRAY | Freq: Two times a day (BID) | RESPIRATORY_TRACT | 3 refills | Status: DC

## 2019-07-15 MED FILL — ALBUTEROL SULFATE HFA 108 (: 108 (90 BAS | 16 days supply | Qty: 18 | Fill #0

## 2019-07-15 MED FILL — SERTRALINE HCL 25 MG TABLET: 25 | 30 days supply | Qty: 30 | Fill #0

## 2019-07-15 MED FILL — SYMBICORT 160-4.5 MCG INH: 160-4.5 | 30 days supply | Qty: 10 | Fill #0

## 2019-07-15 MED FILL — traZODone HCL 50 MG TABS: 50 | 30 days supply | Qty: 30 | Fill #0

## 2019-07-15 MED FILL — hydrOXYzine HCL 25 MG TABS: 25 | 10 days supply | Qty: 30 | Fill #0

## 2019-07-15 MED FILL — GABAPENTIN 300 MG CAPSULE: 300 | 30 days supply | Qty: 90 | Fill #0

## 2019-07-15 NOTE — ED Triage Notes (Signed)
Pt here from [facility] with c/o cough and sob , pt was here for thew same about 1 week ago , pt thinks that he may have had a fever , pt stills smokes and has copd
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
## 2019-07-15 NOTE — ED Provider Notes (Signed)
[facility] EMERGENCY DEPARTMENT Provider Note   CSN: [id number] Arrival date & time: 07/15/19  1217     History Chief Complaint  Patient presents with  . Shortness of Breath    Samuel Reyes is a 63 y.o. male.  The history is provided by the patient.  Shortness of Breath Severity:  Mild Onset quality:  Gradual Timing:  Intermittent Progression:  Waxing and waning Chronicity:  Recurrent Context: smoke exposure   Relieved by:  Nothing Worsened by:  Nothing Associated symptoms: cough and wheezing   Associated symptoms: no abdominal pain, no chest pain, no ear pain, no fever, no rash, no sore throat, no sputum production and no vomiting   Risk factors: alcohol use   Risk factors comment:  COPD      Past Medical History:  Diagnosis Date  . Alcoholism ([place])   . Barrett's esophagus   . CAD (coronary artery disease) 2002   MI, no intervention required  . COPD (chronic obstructive pulmonary disease) (HCC)    not on home O2  . Depression   . DTs (delirium tremens) ([place])   . GERD (gastroesophageal reflux disease)   . Headache   . Homeless   . Hypertension   . Stroke [facility]) 2006    Patient Active Problem List   Diagnosis Date Noted  . Pressure injury of skin 03/25/2019  . Leukocytosis 03/25/2019  . Thrombocytosis ([place]) 03/25/2019  . Sunburn 03/25/2019  . Epigastric abdominal pain 03/25/2019  . QT prolongation 09/09/2018  . Overdose 09/06/2018  . Substance induced mood disorder ([place]) 07/25/2018  . MDD (major depressive disorder), recurrent episode, severe ([place]) 07/18/2018  . MDD (major depressive disorder), recurrent severe, without psychosis ([place]) 08/26/2017  . Alcohol withdrawal ([place]) 08/08/2017  . COPD with acute bronchitis ([place]) 07/29/2017  . Alcohol use   . Homelessness 06/24/2017  . Lung nodule 09/03/2016  . Tobacco abuse 09/03/2016  . Insomnia 09/03/2016  . HCAP (healthcare-associated pneumonia) 08/22/2016  . Bronchiolitis  08/21/2016  . Malnutrition of moderate degree 08/04/2016  . Atrial tachycardia ([place]) 08/02/2016  . Impaired glucose tolerance 08/02/2016  . Alcohol use with alcohol-induced mood disorder ([place]) 12/12/2015  . Syncope 11/15/2015  . Chest pain 11/15/2015  . Nausea and vomiting 11/15/2015  . Abdominal pain 11/15/2015  . Major depressive disorder, recurrent episode, moderate with anxious distress ([place]) 10/30/2015  . Alcohol use disorder, severe, dependence ([place]) 10/29/2015  . COPD exacerbation ([place]) 09/20/2015  . Acute respiratory failure with hypoxia ([place]) 09/18/2015  . Suicidal ideation 09/17/2015  . Alcohol intoxication ([place]) 09/17/2015  . Depression 09/17/2015  . Benign essential HTN 09/17/2015  . Hypokalemia 09/17/2015  . Hyponatremia 09/17/2015  . Coffee ground emesis 09/17/2015  . COPD (chronic obstructive pulmonary disease) ([place]) 02/17/2013  . GERD (gastroesophageal reflux disease) 02/17/2013    Past Surgical History:  Procedure Laterality Date  . BACK SURGERY     3 cervical spine surgeries C4-C5 fused  . COLONOSCOPY N/A 01/04/2014   Procedure: COLONOSCOPY;  Surgeon: Danie Binder, MD;  [facility];  Service: Endoscopy;  Laterality: N/A;  1:45  . ESOPHAGOGASTRODUODENOSCOPY N/A 01/04/2014   Procedure: ESOPHAGOGASTRODUODENOSCOPY (EGD);  Surgeon: Danie Binder, MD;  Location[facility];  Service: Endoscopy;  Laterality: N/A;  . FINGER SURGERY Left    2nd, 3rd, & 4th fingers were cut off by table saw and reattached  . GASTRECTOMY    . HERNIA REPAIR    . INCISIONAL HERNIA REPAIR N/A 01/20/2014   Procedure:  LAPAROSCOPIC RECURRENT  INCISIONAL HERNIA with mesh;  Surgeon: Edward Jolly, MD;  Location: [facility] ORS;  Service: General;  Laterality: N/A;  . rt knee arthroscopic surgery    . SHOULDER SURGERY Bilateral    3 surgeries on on left, 2 surgeries on right        Family History  Problem Relation Age of Onset  . Cancer Father        bone  . Cancer Brother          lungs  . Stroke Maternal Grandmother   . Asthma Son        died at age 66 in his sleep   . Spina bifida Son        died at age 69   . Dementia Mother   . Colon cancer Neg Hx     Social History   Tobacco Use  . Smoking status: Current Every Day Smoker    Packs/day: 1.00    Years: 52.00    Pack years: 52.00    Types: Cigarettes    Start date: 07/30/1966  . Smokeless tobacco: Never Used  . Tobacco comment: uses nicotine patch  Substance Use Topics  . Alcohol use: Yes    Alcohol/week: 15.0 standard drinks    Types: 15 Cans of beer per week    Comment: last drink 2 days ago  . Drug use: No    Comment: denied using any drugs    Home Medications Prior to Admission medications   Medication Sig Start Date End Date Taking? Authorizing Provider  albuterol (VENTOLIN HFA) 108 (90 Base) MCG/ACT inhaler Inhale 2 puffs into the lungs every 4 (four) hours as needed for wheezing or shortness of breath. 07/15/19   [name], NP  budesonide-formoterol (SYMBICORT) 160-4.5 MCG/ACT inhaler Inhale 2 puffs into the lungs 2 (two) times daily. 07/15/19   [name], NP  doxycycline (VIBRAMYCIN) 100 MG capsule Take 1 capsule (100 mg total) by mouth 2 (two) times daily. Patient not taking: Reported on 07/15/2019 06/26/19   Horton, Barbette Hair, MD  gabapentin (NEURONTIN) 300 MG capsule Take 1 capsule (300 mg total) by mouth 3 (three) times daily. 07/15/19   [name], NP  hydrOXYzine (ATARAX/VISTARIL) 25 MG tablet Take 1 tablet (25 mg total) by mouth 3 (three) times daily as needed for anxiety. 07/15/19   [name], NP  Multiple Vitamin (MULTIVITAMIN WITH MINERALS) TABS tablet Take 1 tablet by mouth daily. Patient not taking: Reported on 06/26/2019 06/18/19   Rankin, Shuvon B, NP  sertraline (ZOLOFT) 25 MG tablet Take 1 tablet (25 mg total) by mouth daily. 07/15/19   [name], NP  traZODone (DESYREL) 50 MG tablet Take 1 tablet (50 mg total) by mouth at  bedtime as needed for sleep. 07/15/19   [name], NP    Allergies    Bee venom, Penicillins, and Vancomycin  Review of Systems   Review of Systems  Constitutional: Negative for chills and fever.  HENT: Negative for ear pain and sore throat.   Eyes: Negative for pain and visual disturbance.  Respiratory: Positive for cough, shortness of breath and wheezing. Negative for sputum production.   Cardiovascular: Negative for chest pain and palpitations.  Gastrointestinal: Negative for abdominal pain and vomiting.  Genitourinary: Negative for dysuria and hematuria.  Musculoskeletal: Negative for arthralgias and back pain.  Skin: Negative for color change and rash.  Neurological: Negative for seizures and syncope.  All other systems reviewed and  are negative.   Physical Exam Updated Vital Signs  ED Triage Vitals [07/15/19 1330]  Enc Vitals Group     BP (!) 150/90     Pulse Rate 86     Resp 20     Temp      Temp src      SpO2 99 %     Weight      Height      Head Circumference      Peak Flow      Pain Score      Pain Loc      Pain Edu?      Excl. in [place]?     Physical Exam Vitals and nursing note reviewed.  Constitutional:      Appearance: He is well-developed.  HENT:     Head: Normocephalic and atraumatic.     Mouth/Throat:     Mouth: Mucous membranes are moist.  Eyes:     Extraocular Movements: Extraocular movements intact.     Conjunctiva/sclera: Conjunctivae normal.     Pupils: Pupils are equal, round, and reactive to light.  Cardiovascular:     Rate and Rhythm: Normal rate and regular rhythm.     Heart sounds: Normal heart sounds. No murmur.  Pulmonary:     Effort: Pulmonary effort is normal. No tachypnea or respiratory distress.     Breath sounds: Wheezing present. No decreased breath sounds.  Abdominal:     Palpations: Abdomen is soft.     Tenderness: There is no abdominal tenderness.  Musculoskeletal:        General: Normal range of motion.      Cervical back: Normal range of motion and neck supple.     Right lower leg: No edema.     Left lower leg: No edema.  Skin:    General: Skin is warm and dry.     Capillary Refill: Capillary refill takes less than 2 seconds.  Neurological:     General: No focal deficit present.     Mental Status: He is alert.     ED Results / Procedures / Treatments   Labs (all labs ordered are listed, but only abnormal results are displayed) Labs Reviewed  CBC - Abnormal; Notable for the following components:      Result Value   Hemoglobin 12.2 (*)    RDW 17.3 (*)    All other components within normal limits  COMPREHENSIVE METABOLIC PANEL - Abnormal; Notable for the following components:   Glucose, Bld 124 (*)    Calcium 8.8 (*)    Albumin 3.3 (*)    All other components within normal limits  TROPONIN I (HIGH SENSITIVITY)    EKG EKG Interpretation  Date/Time:  Wednesday July 15 2019 12:40:07 EST Ventricular Rate:  99 PR Interval:  124 QRS Duration: 92 QT Interval:  344 QTC Calculation: 441 R Axis:   78 Text Interpretation: Normal sinus rhythm Normal ECG Confirmed by Lennice Sites [phone]) on 07/15/2019 1:16:36 PM   Radiology DG Chest 2 View  Result Date: 07/15/2019 CLINICAL DATA:  Productive cough and shortness of breath for several days EXAM: CHEST - 2 VIEW COMPARISON:  07/08/2019 FINDINGS: Cardiac shadow is stable. Aortic calcifications are again seen. The lungs are well aerated without focal infiltrate or sizable effusion. No bony abnormality is noted. IMPRESSION: No active cardiopulmonary disease. Electronically Signed   By: Inez Catalina M.D.   On: 07/15/2019 13:06    Procedures Procedures (including critical care time)  Medications Ordered  in ED Medications  dexamethasone (DECADRON) tablet 6 mg (6 mg Oral Given 07/15/19 1414)  albuterol (VENTOLIN HFA) 108 (90 Base) MCG/ACT inhaler 4 puff (4 puffs Inhalation Given 07/15/19 1414)    ED Course  I have reviewed the  triage vital signs and the nursing notes.  Pertinent labs & imaging results that were available during my care of the patient were reviewed by me and considered in my medical decision making (see chart for details).    MDM Rules/Calculators/A&P                       Collis Fleming is a 63 year old male with history of COPD, polysubstance abuse who presents to the ED with shortness of breath.  Patient with normal vitals.  No fever.  With symptoms for the last several days.  Continues to smoke.  Denies any sputum production or fevers.  Was told that maybe he had a pneumonia recently.  However chest x-ray shows no signs of infection.  No leukocytosis.  No fever.  Has had a recent negative coronavirus test.  Patient states that he ran out of his inhaler.  Does have some mild wheezing on exam.  Possibly a mild COPD exacerbation.  No significant electrolyte abnormality.  EKG shows sinus rhythm.  Troponin is normal.  Patient does not have any chest pain.  Doubt ACS. No PE risk factors and doubt PE.  Overall patient states that he is here due to homelessness.  Do not suspect any acute issues at this time.  Have given patient Decadron and inhaler for possibly mild COPD exacerbation.  Will be sent home with inhaler.  He does follow-up with social work and resources for homelessness.  Patient was given a meal and discharged from the ED in good condition.  This chart was dictated using voice recognition software.  Despite best efforts to proofread,  errors can occur which can change the documentation meaning.    Final Clinical Impression(s) / ED Diagnoses Final diagnoses:  COPD exacerbation [facility])    Rx / DC Orders ED Discharge Orders    None       Lennice Sites, DO 07/15/19 1448

## 2019-07-15 NOTE — ED Notes (Signed)
Pt provided ED happy meal per MD request
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
## 2019-08-01 ENCOUNTER — Encounter (HOSPITAL_COMMUNITY): Payer: Self-pay | Admitting: *Deleted

## 2019-08-01 ENCOUNTER — Emergency Department (HOSPITAL_COMMUNITY): Payer: Medicaid Other

## 2019-08-01 ENCOUNTER — Other Ambulatory Visit: Payer: Self-pay

## 2019-08-01 ENCOUNTER — Inpatient Hospital Stay (HOSPITAL_COMMUNITY): Payer: Medicaid Other

## 2019-08-01 ENCOUNTER — Inpatient Hospital Stay (HOSPITAL_COMMUNITY)
Admission: EM | Admit: 2019-08-01 | Discharge: 2019-08-04 | DRG: 923 | Payer: Medicaid Other | Attending: Internal Medicine | Admitting: Internal Medicine

## 2019-08-01 DIAGNOSIS — Y908 Blood alcohol level of 240 mg/100 ml or more: Secondary | ICD-10-CM | POA: Diagnosis present

## 2019-08-01 DIAGNOSIS — E872 Acidosis: Secondary | ICD-10-CM | POA: Diagnosis present

## 2019-08-01 DIAGNOSIS — F331 Major depressive disorder, recurrent, moderate: Secondary | ICD-10-CM | POA: Diagnosis present

## 2019-08-01 DIAGNOSIS — F1020 Alcohol dependence, uncomplicated: Secondary | ICD-10-CM | POA: Diagnosis present

## 2019-08-01 DIAGNOSIS — Z91030 Bee allergy status: Secondary | ICD-10-CM

## 2019-08-01 DIAGNOSIS — J449 Chronic obstructive pulmonary disease, unspecified: Secondary | ICD-10-CM | POA: Diagnosis present

## 2019-08-01 DIAGNOSIS — K2270 Barrett's esophagus without dysplasia: Secondary | ICD-10-CM | POA: Diagnosis present

## 2019-08-01 DIAGNOSIS — X31XXXA Exposure to excessive natural cold, initial encounter: Secondary | ICD-10-CM | POA: Diagnosis not present

## 2019-08-01 DIAGNOSIS — Z66 Do not resuscitate: Secondary | ICD-10-CM | POA: Diagnosis present

## 2019-08-01 DIAGNOSIS — Z20822 Contact with and (suspected) exposure to covid-19: Secondary | ICD-10-CM | POA: Diagnosis present

## 2019-08-01 DIAGNOSIS — F10129 Alcohol abuse with intoxication, unspecified: Secondary | ICD-10-CM

## 2019-08-01 DIAGNOSIS — Z9119 Patient's noncompliance with other medical treatment and regimen: Secondary | ICD-10-CM

## 2019-08-01 DIAGNOSIS — R45851 Suicidal ideations: Secondary | ICD-10-CM | POA: Diagnosis present

## 2019-08-01 DIAGNOSIS — F10239 Alcohol dependence with withdrawal, unspecified: Secondary | ICD-10-CM | POA: Diagnosis present

## 2019-08-01 DIAGNOSIS — R680 Hypothermia, not associated with low environmental temperature: Secondary | ICD-10-CM | POA: Diagnosis not present

## 2019-08-01 DIAGNOSIS — T68XXXA Hypothermia, initial encounter: Secondary | ICD-10-CM | POA: Diagnosis not present

## 2019-08-01 DIAGNOSIS — Z720 Tobacco use: Secondary | ICD-10-CM | POA: Diagnosis not present

## 2019-08-01 DIAGNOSIS — D473 Essential (hemorrhagic) thrombocythemia: Secondary | ICD-10-CM

## 2019-08-01 DIAGNOSIS — R1310 Dysphagia, unspecified: Secondary | ICD-10-CM | POA: Diagnosis not present

## 2019-08-01 DIAGNOSIS — F419 Anxiety disorder, unspecified: Secondary | ICD-10-CM | POA: Diagnosis present

## 2019-08-01 DIAGNOSIS — T699XXA Effect of reduced temperature, unspecified, initial encounter: Secondary | ICD-10-CM | POA: Diagnosis not present

## 2019-08-01 DIAGNOSIS — K219 Gastro-esophageal reflux disease without esophagitis: Secondary | ICD-10-CM | POA: Diagnosis present

## 2019-08-01 DIAGNOSIS — E8729 Other acidosis: Secondary | ICD-10-CM

## 2019-08-01 DIAGNOSIS — Z881 Allergy status to other antibiotic agents status: Secondary | ICD-10-CM

## 2019-08-01 DIAGNOSIS — E440 Moderate protein-calorie malnutrition: Secondary | ICD-10-CM | POA: Diagnosis present

## 2019-08-01 DIAGNOSIS — G9340 Encephalopathy, unspecified: Secondary | ICD-10-CM | POA: Diagnosis present

## 2019-08-01 DIAGNOSIS — Z880 Allergy status to penicillin: Secondary | ICD-10-CM

## 2019-08-01 DIAGNOSIS — I2510 Atherosclerotic heart disease of native coronary artery without angina pectoris: Secondary | ICD-10-CM | POA: Diagnosis present

## 2019-08-01 DIAGNOSIS — Z823 Family history of stroke: Secondary | ICD-10-CM | POA: Diagnosis not present

## 2019-08-01 DIAGNOSIS — Z8673 Personal history of transient ischemic attack (TIA), and cerebral infarction without residual deficits: Secondary | ICD-10-CM | POA: Diagnosis not present

## 2019-08-01 DIAGNOSIS — F1024 Alcohol dependence with alcohol-induced mood disorder: Secondary | ICD-10-CM | POA: Diagnosis present

## 2019-08-01 DIAGNOSIS — Z590 Homelessness: Secondary | ICD-10-CM

## 2019-08-01 DIAGNOSIS — R519 Headache, unspecified: Secondary | ICD-10-CM | POA: Diagnosis not present

## 2019-08-01 DIAGNOSIS — F1094 Alcohol use, unspecified with alcohol-induced mood disorder: Secondary | ICD-10-CM | POA: Diagnosis present

## 2019-08-01 DIAGNOSIS — I10 Essential (primary) hypertension: Secondary | ICD-10-CM | POA: Diagnosis present

## 2019-08-01 DIAGNOSIS — R070 Pain in throat: Secondary | ICD-10-CM | POA: Diagnosis not present

## 2019-08-01 DIAGNOSIS — Z79899 Other long term (current) drug therapy: Secondary | ICD-10-CM

## 2019-08-01 DIAGNOSIS — F329 Major depressive disorder, single episode, unspecified: Secondary | ICD-10-CM | POA: Diagnosis not present

## 2019-08-01 DIAGNOSIS — F10229 Alcohol dependence with intoxication, unspecified: Secondary | ICD-10-CM | POA: Diagnosis present

## 2019-08-01 DIAGNOSIS — E871 Hypo-osmolality and hyponatremia: Secondary | ICD-10-CM | POA: Diagnosis present

## 2019-08-01 DIAGNOSIS — Z915 Personal history of self-harm: Secondary | ICD-10-CM | POA: Diagnosis not present

## 2019-08-01 DIAGNOSIS — D72829 Elevated white blood cell count, unspecified: Secondary | ICD-10-CM | POA: Diagnosis present

## 2019-08-01 DIAGNOSIS — R1013 Epigastric pain: Secondary | ICD-10-CM | POA: Diagnosis not present

## 2019-08-01 LAB — PHOSPHORUS: Phosphorus: 3.9 mg/dL (ref 2.5–4.6)

## 2019-08-01 LAB — CBC WITH DIFFERENTIAL/PLATELET
Abs Immature Granulocytes: 0.09 10*3/uL — ABNORMAL HIGH (ref 0.00–0.07)
Basophils Absolute: 0.1 10*3/uL (ref 0.0–0.1)
Basophils Relative: 0 %
Eosinophils Absolute: 0 10*3/uL (ref 0.0–0.5)
Eosinophils Relative: 0 %
HCT: 51.9 % (ref 39.0–52.0)
Hemoglobin: 15.6 g/dL (ref 13.0–17.0)
Immature Granulocytes: 1 %
Lymphocytes Relative: 10 %
Lymphs Abs: 1.7 10*3/uL (ref 0.7–4.0)
MCH: 26.4 pg (ref 26.0–34.0)
MCHC: 30.1 g/dL (ref 30.0–36.0)
MCV: 88 fL (ref 80.0–100.0)
Monocytes Absolute: 1.8 10*3/uL — ABNORMAL HIGH (ref 0.1–1.0)
Monocytes Relative: 10 %
Neutro Abs: 14.2 10*3/uL — ABNORMAL HIGH (ref 1.7–7.7)
Neutrophils Relative %: 79 %
Platelets: 428 10*3/uL — ABNORMAL HIGH (ref 150–400)
RBC: 5.9 MIL/uL — ABNORMAL HIGH (ref 4.22–5.81)
RDW: 16.5 % — ABNORMAL HIGH (ref 11.5–15.5)
WBC: 17.9 10*3/uL — ABNORMAL HIGH (ref 4.0–10.5)
nRBC: 0 % (ref 0.0–0.2)

## 2019-08-01 LAB — URINALYSIS, ROUTINE W REFLEX MICROSCOPIC
Bilirubin Urine: NEGATIVE
Glucose, UA: NEGATIVE mg/dL
Hgb urine dipstick: NEGATIVE
Ketones, ur: 5 mg/dL — AB
Leukocytes,Ua: NEGATIVE
Nitrite: NEGATIVE
Protein, ur: NEGATIVE mg/dL
Specific Gravity, Urine: 1.008 (ref 1.005–1.030)
pH: 5 (ref 5.0–8.0)

## 2019-08-01 LAB — ETHANOL: Alcohol, Ethyl (B): 274 mg/dL — ABNORMAL HIGH (ref <10)

## 2019-08-01 LAB — COMPREHENSIVE METABOLIC PANEL
ALT: 18 U/L (ref 0–44)
AST: 36 U/L (ref 15–41)
Albumin: 4 g/dL (ref 3.5–5.0)
Alkaline Phosphatase: 90 U/L (ref 38–126)
Anion gap: 19 — ABNORMAL HIGH (ref 5–15)
BUN: 12 mg/dL (ref 8–23)
CO2: 14 mmol/L — ABNORMAL LOW (ref 22–32)
Calcium: 8.7 mg/dL — ABNORMAL LOW (ref 8.9–10.3)
Chloride: 103 mmol/L (ref 98–111)
Creatinine, Ser: 0.79 mg/dL (ref 0.61–1.24)
GFR calc Af Amer: >60 mL/min (ref >60)
GFR calc non Af Amer: >60 mL/min (ref >60)
Glucose, Bld: 128 mg/dL — ABNORMAL HIGH (ref 70–99)
Potassium: 4 mmol/L (ref 3.5–5.1)
Sodium: 136 mmol/L (ref 135–145)
Total Bilirubin: 0.5 mg/dL (ref 0.3–1.2)
Total Protein: 7.4 g/dL (ref 6.5–8.1)

## 2019-08-01 LAB — POCT I-STAT EG7
Acid-base deficit: 5 mmol/L — ABNORMAL HIGH (ref 0.0–2.0)
Bicarbonate: 21.6 mmol/L (ref 20.0–28.0)
Calcium, Ion: 0.98 mmol/L — ABNORMAL LOW (ref 1.15–1.40)
HCT: 43 % (ref 39.0–52.0)
Hemoglobin: 14.6 g/dL (ref 13.0–17.0)
O2 Saturation: 92 %
Potassium: 5 mmol/L (ref 3.5–5.1)
Sodium: 138 mmol/L (ref 135–145)
TCO2: 23 mmol/L (ref 22–32)
pCO2, Ven: 44.7 mmHg (ref 44.0–60.0)
pH, Ven: 7.292 (ref 7.250–7.430)
pO2, Ven: 73 mmHg — ABNORMAL HIGH (ref 32.0–45.0)

## 2019-08-01 LAB — LACTIC ACID, PLASMA
Lactic Acid, Venous: 7.5 mmol/L (ref 0.5–1.9)
Lactic Acid, Venous: 3.9 mmol/L (ref 0.5–1.9)
Lactic Acid, Venous: 3.2 mmol/L (ref 0.5–1.9)

## 2019-08-01 LAB — RAPID URINE DRUG SCREEN, HOSP PERFORMED
Amphetamines: NOT DETECTED
Barbiturates: NOT DETECTED
Benzodiazepines: NOT DETECTED
Cocaine: NOT DETECTED
Opiates: NOT DETECTED
Tetrahydrocannabinol: NOT DETECTED

## 2019-08-01 LAB — CK: Total CK: 210 U/L (ref 49–397)

## 2019-08-01 LAB — CBG MONITORING, ED: Glucose-Capillary: 113 mg/dL — ABNORMAL HIGH (ref 70–99)

## 2019-08-01 LAB — ACETAMINOPHEN LEVEL: Acetaminophen (Tylenol), Serum: <10 ug/mL — ABNORMAL LOW (ref 10–30)

## 2019-08-01 LAB — TROPONIN I (HIGH SENSITIVITY)
Troponin I (High Sensitivity): 10 ng/L (ref <18)
Troponin I (High Sensitivity): 17 ng/L (ref <18)

## 2019-08-01 LAB — POC SARS CORONAVIRUS 2 AG -  ED: SARS Coronavirus 2 Ag: NEGATIVE

## 2019-08-01 LAB — SALICYLATE LEVEL: Salicylate Lvl: <7 mg/dL — ABNORMAL LOW (ref 7.0–30.0)

## 2019-08-01 LAB — SARS CORONAVIRUS 2 (TAT 6-24 HRS): SARS Coronavirus 2: NEGATIVE

## 2019-08-01 LAB — MAGNESIUM: Magnesium: 1.9 mg/dL (ref 1.7–2.4)

## 2019-08-01 MED ORDER — SODIUM CHLORIDE 0.9 % IV BOLUS
1000.0000 mL | Freq: Once | INTRAVENOUS | Status: AC
  Administered 2019-08-01: 10:00:00 1000 mL via INTRAVENOUS

## 2019-08-01 MED ORDER — FLUTICASONE FUROATE-VILANTEROL 200-25 MCG/INH IN AEPB
1.0000 | INHALATION_SPRAY | Freq: Every day | RESPIRATORY_TRACT | Status: DC
  Administered 2019-08-02 – 2019-08-04 (×3): 1 via RESPIRATORY_TRACT
  Filled 2019-08-01: qty 28

## 2019-08-01 MED ORDER — SODIUM CHLORIDE 0.9 % IV SOLN: INTRAVENOUS | Status: DC

## 2019-08-01 MED ORDER — ENOXAPARIN SODIUM 40 MG/0.4ML ~~LOC~~ SOLN
40.0000 mg | SUBCUTANEOUS | Status: DC
  Administered 2019-08-02 – 2019-08-03 (×2): 40 mg via SUBCUTANEOUS
  Filled 2019-08-01 (×2): qty 0.4

## 2019-08-01 MED ORDER — SODIUM CHLORIDE 0.9 % IV BOLUS
1000.0000 mL | Freq: Once | INTRAVENOUS | Status: AC
  Administered 2019-08-01: 1000 mL via INTRAVENOUS

## 2019-08-01 MED ORDER — VANCOMYCIN HCL 750 MG/150ML IV SOLN
750.0000 mg | Freq: Two times a day (BID) | INTRAVENOUS | Status: DC
  Filled 2019-08-01: qty 150

## 2019-08-01 MED ORDER — LACTATED RINGERS IV SOLN: INTRAVENOUS | Status: DC

## 2019-08-01 MED ORDER — SERTRALINE HCL 25 MG PO TABS
25.0000 mg | ORAL_TABLET | Freq: Every day | ORAL | Status: DC
  Administered 2019-08-02 – 2019-08-04 (×3): 25 mg via ORAL
  Filled 2019-08-01 (×3): qty 1

## 2019-08-01 MED ORDER — THIAMINE HCL 100 MG PO TABS
100.0000 mg | ORAL_TABLET | Freq: Every day | ORAL | Status: DC
  Administered 2019-08-02: 100 mg via ORAL
  Filled 2019-08-01: qty 1

## 2019-08-01 MED ORDER — VANCOMYCIN HCL 1250 MG/250ML IV SOLN
1250.0000 mg | Freq: Once | INTRAVENOUS | Status: DC
  Filled 2019-08-01: qty 250

## 2019-08-01 MED ORDER — LORAZEPAM 2 MG/ML IJ SOLN
0.0000 mg | Freq: Four times a day (QID) | INTRAMUSCULAR | Status: AC
  Administered 2019-08-01: 1 mg via INTRAVENOUS
  Filled 2019-08-01: qty 1

## 2019-08-01 MED ORDER — METRONIDAZOLE IN NACL 5-0.79 MG/ML-% IV SOLN
500.0000 mg | Freq: Once | INTRAVENOUS | Status: AC
  Administered 2019-08-01: 17:00:00 500 mg via INTRAVENOUS
  Filled 2019-08-01: qty 100

## 2019-08-01 MED ORDER — THIAMINE HCL 100 MG/ML IJ SOLN
100.0000 mg | Freq: Every day | INTRAMUSCULAR | Status: DC
  Administered 2019-08-01: 100 mg via INTRAVENOUS
  Filled 2019-08-01: qty 2

## 2019-08-01 MED ORDER — LORAZEPAM 2 MG/ML IJ SOLN: 0.0000 mg | Freq: Two times a day (BID) | INTRAMUSCULAR | Status: DC

## 2019-08-01 MED ORDER — HYDROXYZINE HCL 25 MG PO TABS: 25.0000 mg | ORAL_TABLET | Freq: Three times a day (TID) | ORAL | Status: DC | PRN

## 2019-08-01 MED ORDER — LORAZEPAM 1 MG PO TABS: 0.0000 mg | ORAL_TABLET | Freq: Four times a day (QID) | ORAL | Status: AC

## 2019-08-01 MED ORDER — GABAPENTIN 300 MG PO CAPS
300.0000 mg | ORAL_CAPSULE | Freq: Three times a day (TID) | ORAL | Status: DC
  Administered 2019-08-01 – 2019-08-04 (×9): 300 mg via ORAL
  Filled 2019-08-01 (×9): qty 1

## 2019-08-01 MED ORDER — ALBUTEROL SULFATE (2.5 MG/3ML) 0.083% IN NEBU: 3.0000 mL | INHALATION_SOLUTION | RESPIRATORY_TRACT | Status: DC | PRN

## 2019-08-01 MED ORDER — SODIUM CHLORIDE 0.9 % IV BOLUS
1000.0000 mL | Freq: Once | INTRAVENOUS | Status: AC
  Administered 2019-08-01: 23:00:00 1000 mL via INTRAVENOUS

## 2019-08-01 MED ORDER — SODIUM CHLORIDE 0.9 % IV SOLN
2.0000 g | Freq: Once | INTRAVENOUS | Status: AC
  Administered 2019-08-01: 2 g via INTRAVENOUS
  Filled 2019-08-01: qty 2

## 2019-08-01 MED ORDER — FOLIC ACID 5 MG/ML IJ SOLN
1.0000 mg | Freq: Every day | INTRAMUSCULAR | Status: DC
  Administered 2019-08-01 – 2019-08-02 (×2): 1 mg via INTRAVENOUS
  Filled 2019-08-01 (×3): qty 0.2

## 2019-08-01 MED ORDER — LORAZEPAM 1 MG PO TABS: 0.0000 mg | ORAL_TABLET | Freq: Two times a day (BID) | ORAL | Status: DC

## 2019-08-01 MED ORDER — TRAZODONE HCL 50 MG PO TABS
50.0000 mg | ORAL_TABLET | Freq: Every evening | ORAL | Status: DC | PRN
  Administered 2019-08-02 – 2019-08-03 (×2): 50 mg via ORAL
  Filled 2019-08-01 (×2): qty 1

## 2019-08-01 MED ORDER — LINEZOLID 600 MG/300ML IV SOLN
600.0000 mg | Freq: Two times a day (BID) | INTRAVENOUS | Status: DC
  Administered 2019-08-01: 600 mg via INTRAVENOUS
  Filled 2019-08-01 (×2): qty 300

## 2019-08-01 NOTE — ED Notes (Signed)
Got patient on the monitor patient is resting with call bell in reach and nurse at bedside 

## 2019-08-01 NOTE — ED Notes (Signed)
ED TO INPATIENT HANDOFF REPORT  ED Nurse Name and Phone #: Delisa Finck  S Name/Age/Gender Samuel Reyes 64 y.o. male Room/Bed: 032C/032C  Code Status   Code Status: DNR  Home/SNF/Other Home Patient oriented to: self, place, time and situation Is this baseline? Yes   Triage Complete: Triage complete  Chief Complaint Hypothermia [T68.XXXA]  Triage Note Pt found on ground by friends at 0700, unresponsive, with a 40 oz beer beside him.  It appeared he had been laying outside all night long, soaking wet.  Minimally responsive when placed in truck.  VS wnl.    Allergies Allergies  Allergen Reactions  . Bee Venom Anaphylaxis  . Penicillins Rash    Has patient had a PCN reaction causing immediate rash, facial/tongue/throat swelling, SOB or lightheadedness with hypotension: {Yes Has patient had a PCN reaction causing severe rash involving mucus membranes or skin necrosis: YES Has patient had a PCN reaction that required hospitalization Yes Has patient had a PCN reaction occurring within the last 10 years: YES If all of the above answers are "NO", then may proceed with Cephalosporin use.   . Vancomycin Tinitus    Pt states he "almost went deaf" - does not want to take vanc    Level of Care/Admitting Diagnosis ED Disposition    ED Disposition Condition [place]: [place] [[id number]]  Level of Care: Progressive [102]  Admit to Progressive based on following criteria: NEUROLOGICAL AND NEUROSURGICAL complex patients with significant risk of instability, who do not meet ICU criteria, yet require close observation or frequent assessment (< / = every 2 - 4 hours) with medical / nursing intervention.  Admit to Progressive based on following criteria: ACUTE MENTAL DISORDER-RELATED Drug/Alcohol Ingestion/Overdose/Withdrawal, Suicidal Ideation/attempt requiring safety sitter and < Q2h monitoring/assessments, moderate to severe agitation that is managed  with medication/sitter, CIWA-Ar score < 20.  Covid Evaluation: Asymptomatic Screening Protocol (No Symptoms)  Diagnosis: Hypothermia [[id number]]  Admitting Physician: Lucious Groves [[id number]]  Attending Physician: Lucious Groves [[id number]]  Estimated length of stay: past midnight tomorrow  Certification:: I certify this patient will need inpatient services for at least 2 midnights       B Medical/Surgery History Past Medical History:  Diagnosis Date  . Alcoholism ([place])   . Barrett's esophagus   . CAD (coronary artery disease) 2002   MI, no intervention required  . COPD (chronic obstructive pulmonary disease) (HCC)    not on home O2  . Depression   . DTs (delirium tremens) ([place])   . GERD (gastroesophageal reflux disease)   . Headache   . Homeless   . Hypertension   . Stroke [facility]) 2006   Past Surgical History:  Procedure Laterality Date  . BACK SURGERY     3 cervical spine surgeries C4-C5 fused  . COLONOSCOPY N/A 01/04/2014   Procedure: COLONOSCOPY;  Surgeon: Danie Binder, MD;  Location: [facility];  Service: Endoscopy;  Laterality: N/A;  1:45  . ESOPHAGOGASTRODUODENOSCOPY N/A 01/04/2014   Procedure: ESOPHAGOGASTRODUODENOSCOPY (EGD);  Surgeon: Danie Binder, MD;  Location: [facility];  Service: Endoscopy;  Laterality: N/A;  . FINGER SURGERY Left    2nd, 3rd, & 4th fingers were cut off by table saw and reattached  . GASTRECTOMY    . HERNIA REPAIR    . INCISIONAL HERNIA REPAIR N/A 01/20/2014   Procedure: LAPAROSCOPIC RECURRENT  INCISIONAL HERNIA with mesh;  Surgeon: Edward Jolly, MD;  Location: [facility] ORS;  Service: General;  Laterality: N/A;  . rt knee arthroscopic surgery    . SHOULDER SURGERY Bilateral    3 surgeries on on left, 2 surgeries on right      A IV Location/Drains/Wounds Patient Lines/Drains/Airways Status   Active Line/Drains/Airways    Name:   Placement date:   Placement time:   Site:   Days:   Peripheral IV 08/01/19 Right Hand   08/01/19    1025     Hand   less than 1   Peripheral IV 08/01/19 Right Antecubital   08/01/19    1430    Antecubital   less than 1   Pressure Injury 03/25/19 Foot Right;Left Stage III -  Full thickness tissue loss. Subcutaneous fat may be visible but bone, tendon or muscle are NOT exposed. Mechanical pressure injuries on tops of both feet due to friction from sandal straps   03/25/19    0900     129          Intake/Output Last 24 hours  Intake/Output Summary (Last 24 hours) at 08/01/2019 2044 Last data filed at 08/01/2019 1731 Gross per 24 hour  Intake 2199.87 ml  Output --  Net 2199.87 ml    Labs/Imaging Results for orders placed or performed during the hospital encounter of 08/01/19 (from the past 48 hour(s))  POC CBG, ED     Status: Abnormal   Collection Time: 08/01/19 10:21 AM  Result Value Ref Range   Glucose-Capillary 113 (H) 70 - 99 mg/dL  CBC with Differential     Status: Abnormal   Collection Time: 08/01/19 10:31 AM  Result Value Ref Range   WBC 17.9 (H) 4.0 - 10.5 K/uL   RBC 5.90 (H) 4.22 - 5.81 MIL/uL   Hemoglobin 15.6 13.0 - 17.0 g/dL   HCT 51.9 39.0 - 52.0 %   MCV 88.0 80.0 - 100.0 fL   MCH 26.4 26.0 - 34.0 pg   MCHC 30.1 30.0 - 36.0 g/dL   RDW 16.5 (H) 11.5 - 15.5 %   Platelets 428 (H) 150 - 400 K/uL   nRBC 0.0 0.0 - 0.2 %   Neutrophils Relative % 79 %   Neutro Abs 14.2 (H) 1.7 - 7.7 K/uL   Lymphocytes Relative 10 %   Lymphs Abs 1.7 0.7 - 4.0 K/uL   Monocytes Relative 10 %   Monocytes Absolute 1.8 (H) 0.1 - 1.0 K/uL   Eosinophils Relative 0 %   Eosinophils Absolute 0.0 0.0 - 0.5 K/uL   Basophils Relative 0 %   Basophils Absolute 0.1 0.0 - 0.1 K/uL   Immature Granulocytes 1 %   Abs Immature Granulocytes 0.09 (H) 0.00 - 0.07 K/uL    Comment: Performed at [facility], [address], [place], [place] [address]  Comprehensive metabolic panel     Status: Abnormal   Collection Time: 08/01/19 10:31 AM  Result Value Ref Range   Sodium 136 135 - 145 mmol/L   Potassium 4.0 3.5  - 5.1 mmol/L    Comment: SLIGHT HEMOLYSIS   Chloride 103 98 - 111 mmol/L   CO2 14 (L) 22 - 32 mmol/L   Glucose, Bld 128 (H) 70 - 99 mg/dL   BUN 12 8 - 23 mg/dL   Creatinine, Ser 0.79 0.61 - 1.24 mg/dL   Calcium 8.7 (L) 8.9 - 10.3 mg/dL   Total Protein 7.4 6.5 - 8.1 g/dL   Albumin 4.0 3.5 - 5.0 g/dL   AST 36 15 - 41 U/L   ALT  18 0 - 44 U/L   Alkaline Phosphatase 90 38 - 126 U/L   Total Bilirubin 0.5 0.3 - 1.2 mg/dL   GFR calc non Af Amer >60 >60 mL/min   GFR calc Af Amer >60 >60 mL/min   Anion gap 19 (H) 5 - 15    Comment: Performed at [facility] [address] [place], [place] [address]  CK     Status: None   Collection Time: 08/01/19 10:31 AM  Result Value Ref Range   Total CK 210 49 - 397 U/L    Comment: Performed at [facility], [place] [address] [place], [place] [address]  Lactic acid, plasma     Status: Abnormal   Collection Time: 08/01/19 10:31 AM  Result Value Ref Range   Lactic Acid, Venous 7.5 (HH) 0.5 - 1.9 mmol/L    Comment: CRITICAL RESULT CALLED TO, READ BACK BY AND VERIFIED WITH: B.MICHALSON RN @ 1124 08/01/2019 BY C.EDENS Performed at [facility], [place] [address], [place], [place] [address]   Ethanol     Status: Abnormal   Collection Time: 08/01/19 10:31 AM  Result Value Ref Range   Alcohol, Ethyl (B) 274 (H) <10 mg/dL    Comment: (NOTE) Lowest detectable limit for serum alcohol is 10 mg/dL. For medical purposes only. Performed at [facility], [place] [address], [place], [place] [address]   Troponin I (High Sensitivity)     Status: None   Collection Time: 08/01/19 10:31 AM  Result Value Ref Range   Troponin I (High Sensitivity) 10 <18 ng/L    Comment: (NOTE) Elevated high sensitivity troponin I (hsTnI) values and significant  changes across serial measurements may suggest ACS but many other  chronic and acute conditions are known to elevate hsTnI results.  Refer to the Links section for chest pain algorithms and additional   guidance. Performed at [facility], [place] [address] [place],  [address]   POC SARS Coronavirus 2 Ag-ED - Nasal Swab (BD Veritor Kit)     Status: None   Collection Time: 08/01/19 11:26 AM  Result Value Ref Range   SARS Coronavirus 2 Ag NEGATIVE NEGATIVE    Comment: (NOTE) SARS-CoV-2 antigen NOT DETECTED.  Negative results are presumptive.  Negative results do not preclude SARS-CoV-2 infection and should not be used as the sole basis for treatment or other patient management decisions, including infection  control decisions, particularly in the presence of clinical signs and  symptoms consistent with COVID-19, or in those who have been in contact with the virus.  Negative results must be combined with clinical observations, patient history, and epidemiological information. The expected result is Negative. Fact Sheet for Patients: PodPark.tn Fact Sheet for Healthcare Providers: GiftContent.is This test is not yet approved or cleared by the [place] FDA and  has been authorized for detection and/or diagnosis of SARS-CoV-2 by FDA under an Emergency Use Authorization (EUA).  This EUA will remain in effect (meaning this test can be used) for the duration of  the COVID-19 de claration under Section 564(b)(1) of the Act, 21 U.S.C. section 360bbb-3(b)(1), unless the authorization is terminated or revoked sooner.   Urinalysis, Routine w reflex microscopic     Status: Abnormal   Collection Time: 08/01/19 12:17 PM  Result Value Ref Range   Color, Urine YELLOW YELLOW   APPearance CLEAR CLEAR   Specific Gravity, Urine 1.008 1.005 - 1.030   pH 5.0 5.0 - 8.0  Glucose, UA NEGATIVE NEGATIVE mg/dL   Hgb urine dipstick NEGATIVE NEGATIVE   Bilirubin Urine NEGATIVE NEGATIVE   Ketones, ur 5 (A) NEGATIVE mg/dL   Protein, ur NEGATIVE NEGATIVE mg/dL   Nitrite NEGATIVE NEGATIVE   Leukocytes,Ua NEGATIVE NEGATIVE    Comment:  Performed at [facility] [address], [place], [place] [address]  Rapid urine drug screen (hospital performed)     Status: None   Collection Time: 08/01/19 12:17 PM  Result Value Ref Range   Opiates NONE DETECTED NONE DETECTED   Cocaine NONE DETECTED NONE DETECTED   Benzodiazepines NONE DETECTED NONE DETECTED   Amphetamines NONE DETECTED NONE DETECTED   Tetrahydrocannabinol NONE DETECTED NONE DETECTED   Barbiturates NONE DETECTED NONE DETECTED    Comment: (NOTE) DRUG SCREEN FOR MEDICAL PURPOSES ONLY.  IF CONFIRMATION IS NEEDED FOR ANY PURPOSE, NOTIFY LAB WITHIN 5 DAYS. LOWEST DETECTABLE LIMITS FOR URINE DRUG SCREEN Drug Class                     Cutoff (ng/mL) Amphetamine and metabolites    1000 Barbiturate and metabolites    200 Benzodiazepine                 620 Tricyclics and metabolites     300 Opiates and metabolites        300 Cocaine and metabolites        300 THC                            50 Performed at [facility], [place] [address] [place], [place] [address]   Lactic acid, plasma     Status: Abnormal   Collection Time: 08/01/19  2:08 PM  Result Value Ref Range   Lactic Acid, Venous 3.9 (HH) 0.5 - 1.9 mmol/L    Comment: CRITICAL VALUE NOTED.  VALUE IS CONSISTENT WITH PREVIOUSLY REPORTED AND CALLED VALUE. Performed at [facility], [place] [address], [place], [place] [address]   Troponin I (High Sensitivity)     Status: None   Collection Time: 08/01/19  2:08 PM  Result Value Ref Range   Troponin I (High Sensitivity) 17 <18 ng/L    Comment: HEMOLYSIS AT THIS LEVEL MAY AFFECT RESULT (NOTE) Elevated high sensitivity troponin I (hsTnI) values and significant  changes across serial measurements may suggest ACS but many other  chronic and acute conditions are known to elevate hsTnI results.  Refer to the Links section for chest pain algorithms and additional  guidance. Performed at [facility], [place] [address], [place], [place] [address]    Salicylate level     Status: Abnormal   Collection Time: 08/01/19  2:08 PM  Result Value Ref Range   Salicylate Lvl <6.8 (L) 7.0 - 30.0 mg/dL    Comment: Performed at [facility] [address], [place], [place] [address]  Acetaminophen level     Status: Abnormal   Collection Time: 08/01/19  2:08 PM  Result Value Ref Range   Acetaminophen (Tylenol), Serum <10 (L) 10 - 30 ug/mL    Comment: (NOTE) Therapeutic concentrations vary significantly. A range of 10-30 ug/mL  may be an effective concentration for many patients. However, some  are best treated at concentrations outside of this range. Acetaminophen concentrations >150 ug/mL at 4 hours after ingestion  and >50 ug/mL at 12 hours after ingestion are often associated with  toxic reactions.  Performed at [facility], [place] [address], [place], [place] [address]   SARS CORONAVIRUS 2 (TAT 6-24 HRS) Nasopharyngeal Nasopharyngeal Swab     Status: None   Collection Time: 08/01/19  2:08 PM   Specimen: Nasopharyngeal Swab  Result Value Ref Range   SARS Coronavirus 2 NEGATIVE NEGATIVE    Comment: (NOTE) SARS-CoV-2 target nucleic acids are NOT DETECTED. The SARS-CoV-2 RNA is generally detectable in upper and lower respiratory specimens during the acute phase of infection. Negative results do not preclude SARS-CoV-2 infection, do not rule out co-infections with other pathogens, and should not be used as the sole basis for treatment or other patient management decisions. Negative results must be combined with clinical observations, patient history, and epidemiological information. The expected result is Negative. Fact Sheet for Patients: SugarRoll.be Fact Sheet for Healthcare Providers: https://www.woods-mathews.com/ This test is not yet approved or cleared by the [place] FDA and  has been authorized for detection and/or diagnosis of SARS-CoV-2 by FDA under an Emergency Use  Authorization (EUA). This EUA will remain  in effect (meaning this test can be used) for the duration of the COVID-19 declaration under Section 56 4(b)(1) of the Act, 21 U.S.C. section 360bbb-3(b)(1), unless the authorization is terminated or revoked sooner. Performed at [facility], [place] [address] [place], [place] [address]   Magnesium     Status: None   Collection Time: 08/01/19  2:08 PM  Result Value Ref Range   Magnesium 1.9 1.7 - 2.4 mg/dL    Comment: Performed at [facility], [place] [address] [place], [place] [address]  Phosphorus     Status: None   Collection Time: 08/01/19  2:08 PM  Result Value Ref Range   Phosphorus 3.9 2.5 - 4.6 mg/dL    Comment: HEMOLYSIS AT THIS LEVEL MAY AFFECT RESULT Performed at [facility], [place] [address] [place], [place] [address]   POCT I-Stat EG7     Status: Abnormal   Collection Time: 08/01/19  2:38 PM  Result Value Ref Range   pH, Ven 7.292 7.250 - 7.430   pCO2, Ven 44.7 44.0 - 60.0 mmHg   pO2, Ven 73.0 (H) 32.0 - 45.0 mmHg   Bicarbonate 21.6 20.0 - 28.0 mmol/L   TCO2 23 22 - 32 mmol/L   O2 Saturation 92.0 %   Acid-base deficit 5.0 (H) 0.0 - 2.0 mmol/L   Sodium 138 135 - 145 mmol/L   Potassium 5.0 3.5 - 5.1 mmol/L   Calcium, Ion 0.98 (L) 1.15 - 1.40 mmol/L   HCT 43.0 39.0 - 52.0 %   Hemoglobin 14.6 13.0 - 17.0 g/dL   Patient temperature HIDE    Sample type VENOUS    CT HEAD WO CONTRAST  Result Date: 08/01/2019 CLINICAL DATA:  Found unresponsive. EXAM: CT HEAD WITHOUT CONTRAST TECHNIQUE: Contiguous axial images were obtained from the base of the skull through the vertex without intravenous contrast. COMPARISON:  06/26/2019 FINDINGS: Brain: Advanced cerebral atrophy. No mass lesion, hemorrhage, hydrocephalus, acute infarct, intra-axial, or extra-axial fluid collection. Mild low density in the periventricular white matter likely related to small vessel disease. Vascular: No hyperdense vessel or unexpected calcification.  Skull: No significant soft tissue swelling.  No skull fracture. Sinuses/Orbits: Normal imaged portions of the orbits and globes. Clear paranasal sinuses and mastoid air cells. Other: None. IMPRESSION: 1.  No acute intracranial abnormality. 2.  Cerebral atrophy and small vessel ischemic change. Electronically Signed   By: Adria Devon.D.  On: 08/01/2019 17:38   DG Chest Portable 1 View  Result Date: 08/01/2019 CLINICAL DATA:  Hypothermia. EXAM: PORTABLE CHEST 1 VIEW COMPARISON:  Chest radiograph 07/15/2019 FINDINGS: Heart size within normal limits. No airspace consolidation. No evidence of pleural effusion or pneumothorax. No acute bony abnormality is identified. Surgical clips project in the region of the GE junction. Overlying cardiac monitoring leads. IMPRESSION: No evidence of acute cardiopulmonary abnormality. Electronically Signed   By: Kellie Simmering DO   On: 08/01/2019 10:52    Pending Labs Unresulted Labs (From admission, onward)    Start     Ordered   08/08/19 0500  Creatinine, serum  (enoxaparin (LOVENOX)    CrCl >/= 30 ml/min)  Weekly,   R    Comments: while on enoxaparin therapy    08/01/19 1416   08/02/19 0500  Comprehensive metabolic panel  Tomorrow morning,   R     08/01/19 1416   08/02/19 0500  CBC  Tomorrow morning,   R     08/01/19 1416   08/01/19 1900  Lactic acid, plasma  Once,   STAT     08/01/19 1744   08/01/19 1414  CBC  (enoxaparin (LOVENOX)    CrCl >/= 30 ml/min)  Once,   STAT    Comments: Baseline for enoxaparin therapy IF NOT ALREADY DRAWN.  Notify MD if PLT < 100 K.    08/01/19 1416   08/01/19 1414  Creatinine, serum  (enoxaparin (LOVENOX)    CrCl >/= 30 ml/min)  Once,   STAT    Comments: Baseline for enoxaparin therapy IF NOT ALREADY DRAWN.    08/01/19 1416   08/01/19 1144  Urine culture  ONCE - STAT,   STAT     08/01/19 1144   08/01/19 1012  Blood culture (routine x 2)  BLOOD CULTURE X 2,   STAT     08/01/19 1012          Vitals/Pain Today's Vitals    08/01/19 1424 08/01/19 1504 08/01/19 1603 08/01/19 1632  BP: 139/81  121/71   Pulse: (!) 106  (!) 107   Resp:   19   Temp:  97.6 F (36.4 C) 98.3 F (36.8 C)   TempSrc:  Oral Oral   SpO2:   100%   Weight:   66.7 kg   Height:   5' 5"  (1.651 m)   PainSc:    8     Isolation Precautions No active isolations  Medications Medications  LORazepam (ATIVAN) injection 0-4 mg (1 mg Intravenous Given 08/01/19 1625)    Or  LORazepam (ATIVAN) tablet 0-4 mg ( Oral See Alternative 08/01/19 1625)  LORazepam (ATIVAN) injection 0-4 mg (has no administration in time range)    Or  LORazepam (ATIVAN) tablet 0-4 mg (has no administration in time range)  thiamine tablet 100 mg ( Oral See Alternative 08/01/19 1421)    Or  thiamine (B-1) injection 100 mg (100 mg Intravenous Given 01/04/88 3810)  folic acid injection 1 mg (1 mg Intravenous Given 08/01/19 1613)  0.9 %  sodium chloride infusion (has no administration in time range)  hydrOXYzine (ATARAX/VISTARIL) tablet 25 mg (has no administration in time range)  sertraline (ZOLOFT) tablet 25 mg (has no administration in time range)  traZODone (DESYREL) tablet 50 mg (has no administration in time range)  gabapentin (NEURONTIN) capsule 300 mg (has no administration in time range)  albuterol (VENTOLIN HFA) 108 (90 Base) MCG/ACT inhaler 2 puff (has no administration in time range)  fluticasone furoate-vilanterol (BREO ELLIPTA) 200-25 MCG/INH 1 puff (1 puff Inhalation Not Given 08/01/19 1614)  enoxaparin (LOVENOX) injection 40 mg (has no administration in time range)  linezolid (ZYVOX) IVPB 600 mg (has no administration in time range)  sodium chloride 0.9 % bolus 1,000 mL (0 mLs Intravenous Stopped 08/01/19 1340)  metroNIDAZOLE (FLAGYL) IVPB 500 mg (0 mg Intravenous Stopped 08/01/19 1731)  ceFEPIme (MAXIPIME) 2 g in sodium chloride 0.9 % 100 mL IVPB (0 g Intravenous Stopped 08/01/19 1451)  sodium chloride 0.9 % bolus 1,000 mL (0 mLs Intravenous Stopped 08/01/19 1612)     Mobility walks with person assist     Focused Assessments    R Recommendations: See Admitting Provider Note  Report given to:   Additional Notes:

## 2019-08-01 NOTE — Progress Notes (Signed)
Pharmacy Antibiotic Note  Samuel Reyes is a 64 y.o. male admitted on 08/01/2019 with hypothermia and altered mental status.  Pharmacy has been consulted for vancomycin dosing. Pt is hypothermic and WBC is elevated. Lactic acid is also elevated. SCr is WNL. Noted reaction of tinnitus to vancomycin. Will monitor.  Plan: Vancomycin 1250mg  IV x 1 then 750mg  IV Q12H F/u renal fxn, C&S, clinical status and peak/trough at SS   Height: 5\' 2"  (157.5 cm) Weight: 149 lb 14.6 oz (68 kg) IBW/kg (Calculated) : 54.6  Temp (24hrs), Avg:83.3 F (28.5 C), Min:83 F (28.3 C), Max:83.5 F (28.6 C)  Recent Labs  Lab 08/01/19 1031  WBC 17.9*  CREATININE 0.79  LATICACIDVEN 7.5*    Estimated Creatinine Clearance: 79.2 mL/min (by C-G formula based on SCr of 0.79 mg/dL).    Allergies  Allergen Reactions  . Bee Venom Anaphylaxis  . Penicillins Rash    Has patient had a PCN reaction causing immediate rash, facial/tongue/throat swelling, SOB or lightheadedness with hypotension: {Yes Has patient had a PCN reaction causing severe rash involving mucus membranes or skin necrosis: YES Has patient had a PCN reaction that required hospitalization Yes Has patient had a PCN reaction occurring within the last 10 years: YES If all of the above answers are "NO", then may proceed with Cephalosporin use.   . Vancomycin Tinitus    Antimicrobials this admission: Vanc 1/2>> Cefepime x 1 1/2 Flagyl x 1 1/2  Dose adjustments this admission: N/A  Microbiology results: Pending  Thank you for allowing pharmacy to be a part of this patient's care.  Daziah Hesler, Rande Lawman 08/01/2019 11:51 AM

## 2019-08-01 NOTE — ED Triage Notes (Signed)
Pt found on ground by friends at 0700, unresponsive, with a 40 oz beer beside him.  It appeared he had been laying outside all night long, soaking wet.  Minimally responsive when placed in truck.  VS wnl.

## 2019-08-01 NOTE — H&P (Signed)
Date: 08/01/2019               Patient Name:  Eris Halteman MRN: [id number]  DOB: [birth year] Age / Sex: 64 y.o., male   PCP: [name], NP         Medical Service: Internal Medicine Teaching Service         Attending Physician: Dr. Heber Morgan Farm    First Contact: Dr. Sheppard Coil Pager: [phone]  Second Contact: Dr. Eileen Stanford Pager: [phone]       After Hours (After 5p/  First Contact Pager: [phone]  weekends / holidays): Second Contact Pager: [phone]   Chief Complaint: Acute encephalopathy, hypothermia  History of Present Illness:   *Collateral information obtained from ED provider, EMS, chart review and partially from patient*  Mr. Malena is a 64 year old un-house gentleman with medical history significant for alcohol use disorder, Barrett's esophagus, coronary artery disease, COPD, delirium tremens, GERD, headache, hypertension, CVA who presented to the emergency department via EMS after he was found to be laying outside in the mud right next to empty beer bottles.  In addition, he was also found to be in wet clothing.  Per EMS, they were contacted by a passerby who had noticed Mr. Fincke in the mud.  On arrival by EMS, his temperature and SPO2 were undetectable however his glucose was unremarkable.  Initially, he was found to be minimally responsive however with active warming on route, he became more responsive.  By the time of our evaluation, he was fully alert, conversational and cooperative. On further questioning, he states that he had been drinking alcohol and subsequently tripped and fell. He experienced a head trauma with the fall. A passerby woke him up, took him to a resource center [facility]) and eventually EMS was called and presented him to the hospital. He is unaware of his down tim. He drank a 12 pack yesterday but usually will drink about a case of alcohol. He denies history of seizures with his alcohol use. He states that he feels anxious, nausea. He also endorses  headache, epigastric pain with breathing and ambulation which was present before his fall. He denies melena, hematochezia, hemoptysis.   He endorses active SI by contemplating jumping off the bridge or in front of traffic. His SI has worsened over the past couple months.He has cut his wrists in the past in an effort to harm himself.   ED course: Hypothermic to 83 F, pulse 85, RR 22, BP 130/107, SPO2 98% on room air. Initial EKG reviewed SVT with HR in the 190s, ventricular bigeminy and several lead artifacts.  Point-of-care CBG 113, CBC showed leukocytosis with WBC of 17, RDW 16.5 neutrophil predominant, thrombocytosis with platelets of 428, CMP revealed bicarb 14, elevated anion gap 19, glucose 128, lactic acid 7.5, ethanol 274, troponin unremarkable.  Meds:  No outpatient medications have been marked as taking for the 08/01/19 encounter [facility] Encounter).   Allergies: Allergies as of 08/01/2019 - Review Complete 08/01/2019  Allergen Reaction Noted  . Bee venom Anaphylaxis 01/30/2013  . Penicillins Rash 01/29/2013  . Vancomycin Tinitus 06/16/2018   Past Medical History:  Diagnosis Date  . Alcoholism ([place])   . Barrett's esophagus   . CAD (coronary artery disease) 2002   MI, no intervention required  . COPD (chronic obstructive pulmonary disease) (HCC)    not on home O2  . Depression   . DTs (delirium tremens) ([place])   . GERD (gastroesophageal reflux disease)   . Headache   .  Homeless   . Hypertension   . Stroke [facility]) 2006  Multiple shoulder and back surgeries Gastrectomy  Two Knee surgeries   Family History:  Family History  Problem Relation Age of Onset  . Cancer Father        bone  . Cancer Brother        lungs  . Stroke Maternal Grandmother   . Asthma Son        died at age 63 in his sleep   . Spina bifida Son        died at age 66   . Dementia Mother   . Colon cancer Neg Hx    Social History: -Usually drink about a case (24 beers) a day. His began drinking when  his son died. He has also unfortunately lost his son and wife (Married for 38 years). He states that he is willing to try to abstain from alcohol and tobacco use.  -States that he will go back to [facility] (on [address]) after discharge  -Un-housed -Usually receives his care from [place]: A complete ROS was negative except as per HPI.   Imaging: EKG: Unable to read. Artifact in lead(s) I II III aVR aVL aVF V1 V2 V3 V4 V5 V6  CXR: IMPRESSION: No evidence of acute cardiopulmonary abnormality.  Physical Exam: Blood pressure 139/81, pulse (!) 106, temperature (!) 87.9 F (31.1 C), temperature source Rectal, resp. rate 17, height 5\' 2"  (1.575 m), weight 68 kg, SpO2 100 %.  Physical Exam Constitutional:      General: He is not in acute distress.    Appearance: He is not toxic-appearing.     Comments: Disheveled   HENT:     Head: Normocephalic and atraumatic.  Eyes:     Extraocular Movements: Extraocular movements intact.     Pupils: Pupils are equal, round, and reactive to light.  Cardiovascular:     Rate and Rhythm: Regular rhythm. Tachycardia present.     Pulses: Normal pulses.     Heart sounds: Normal heart sounds. No murmur.  Pulmonary:     Effort: Pulmonary effort is normal.     Breath sounds: Normal breath sounds. No wheezing, rhonchi or rales.  Abdominal:     General: Bowel sounds are normal. There is distension (Due to body habitus ).     Tenderness: Tenderness: Epigastrium midly tender to palpation   Musculoskeletal:     Cervical back: Normal range of motion and neck supple.     Right lower leg: No edema.     Left lower leg: No edema.  Skin:    General: Skin is warm.     Comments: Old abdominal surgical scar   Neurological:     General: No focal deficit present.     Mental Status: He is alert and oriented to person, place, and time.     Cranial Nerves: No cranial nerve deficit.     Sensory: No sensory deficit.      Motor: No weakness.     Coordination: Coordination normal.  Psychiatric:     Comments: Sad affect, endorses depression and active SI    Assessment & Plan by Problem: Principal Problem:   Hypothermia Active Problems:   Alcohol use disorder, severe, dependence (HCC)   Alcohol use with alcohol-induced mood disorder (HCC)   Malnutrition of moderate degree  In summary, Mr. Birchard is a 64 year old un-house gentleman with medical history significant for alcohol use disorder, Barrett's esophagus, coronary  artery disease, COPD, delirium tremens, GERD, headache, hypertension, CVA who presented to the emergency department via EMS after he was found to be laying outside in the mud right next to empty beer bottles. He is being admitted for alcohol intoxication and hypothermia. Pt also endorses feeling depressed and active SI.  #Hx Alcohol Abuse #Alcohol intoxication: Patient presented with AMS and an ethanol level rated to 274. -CIWA protocol w/ ativan  -Thiamine 100 mg daily -Folate 1 mg daily -Follow-up UDS, acetaminophen level, salicylate level -Magnesium and phosphorus level ordered -Follow up CT head w/o contrast given ground level fall and head trauma  #Hypothermia #Thrombocytosis #Leukocytosis: Although the hypothermia (83 degrees) leukocytosis to 17.9, and elevated lactate to 7.5 context of acute alcohol intoxication, an infection cannot be ruled out at this time.  Blood cultures were drawn and patient received broad-spectrum antibiotics as well as 2 L of normal saline in the ED -Heating blanket -Blood cultures -Trend lactate. Was initallly 7.5 on admission then downtrended to 3.9 with IVF. -Continue IV vancomycin, cefepime, and Flagyl -Repeat EKG  #Depression #Suicidal Ideation: Reports that for the past several months, he has been thinking about jumping off a bridge or jumping in front of traffic because he doesn't want to live anymore. He also has scars from intentional cuts on his L  arm. -Psychiatry consulted -Zoloft 25 mg daily -Trazadone 50 mg nightly PRN -Atarax 25 mg TID PRN for anxiety    #COPD #Tobacco abuse  -Nicotine patch  -Albuterol 2 puffs q4hrs PRN  -Breo Ellipta 1 puff daily   #FEN/GI -Diet: Soft -Fluids: NS 100 cc/hr  #DVT prophylaxis -Lovenox 40 mg subq injections daily    #CODE STATUS: DNR  #Dispo: Admit patient to Inpatient with expected length of stay greater than 2 midnights.  Earlene Plater, MD Internal Medicine, PGY1 Pager: [phone]  08/01/2019,2:38 PM

## 2019-08-01 NOTE — ED Notes (Signed)
PA notified of elevated lactic acid. 

## 2019-08-01 NOTE — ED Notes (Addendum)
Pt states he can't take vanc.  States it caused him to lose his hearing.  Pharmacy notified.

## 2019-08-01 NOTE — ED Provider Notes (Addendum)
[facility] EMERGENCY DEPARTMENT Provider Note   CSN: [id number] Arrival date & time: 08/01/19  [id number]     History Chief Complaint  Patient presents with  . Cold Exposure    Samuel Reyes is a 64 y.o. male.  HPI   64 year old male with history of alcoholism, Barrett's esophagus, CAD, COPD, DTs, GERD, headache, homelessness, hypertension, CVA, who presents to the emergency department today via EMS after being found outside sleeping in the mud next to empty beers. He was noted to have wet clothing due to it raining last night.  Passerby noticed the patient and contacted EMS.  On EMS arrival patient's temperature was undetectable and so was his O2 sat.  His CBG was normal.  He was minimally responsive on their arrival however as he warmed up in route to the ED became more responsive.  They state that the patient is well-known to them.  Level 5 caveat given acuity of condition and pts inability to provide a detailed history.  He denies any pain or problems breathing.  He denies any shortness of breath.  Denies any drug use.  Past Medical History:  Diagnosis Date  . Alcoholism ([place])   . Barrett's esophagus   . CAD (coronary artery disease) 2002   MI, no intervention required  . COPD (chronic obstructive pulmonary disease) (HCC)    not on home O2  . Depression   . DTs (delirium tremens) ([place])   . GERD (gastroesophageal reflux disease)   . Headache   . Homeless   . Hypertension   . Stroke [facility]) 2006    Patient Active Problem List   Diagnosis Date Noted  . Pressure injury of skin 03/25/2019  . Leukocytosis 03/25/2019  . Thrombocytosis ([place]) 03/25/2019  . Sunburn 03/25/2019  . Epigastric abdominal pain 03/25/2019  . QT prolongation 09/09/2018  . Overdose 09/06/2018  . Substance induced mood disorder ([place]) 07/25/2018  . MDD (major depressive disorder), recurrent episode, severe ([place]) 07/18/2018  . MDD (major depressive disorder), recurrent severe, without  psychosis ([place]) 08/26/2017  . Alcohol withdrawal ([place]) 08/08/2017  . COPD with acute bronchitis ([place]) 07/29/2017  . Alcohol use   . Homelessness 06/24/2017  . Lung nodule 09/03/2016  . Tobacco abuse 09/03/2016  . Insomnia 09/03/2016  . HCAP (healthcare-associated pneumonia) 08/22/2016  . Bronchiolitis 08/21/2016  . Malnutrition of moderate degree 08/04/2016  . Atrial tachycardia ([place]) 08/02/2016  . Impaired glucose tolerance 08/02/2016  . Alcohol use with alcohol-induced mood disorder ([place]) 12/12/2015  . Syncope 11/15/2015  . Chest pain 11/15/2015  . Nausea and vomiting 11/15/2015  . Abdominal pain 11/15/2015  . Major depressive disorder, recurrent episode, moderate with anxious distress ([place]) 10/30/2015  . Alcohol use disorder, severe, dependence ([place]) 10/29/2015  . COPD exacerbation ([place]) 09/20/2015  . Acute respiratory failure with hypoxia ([place]) 09/18/2015  . Suicidal ideation 09/17/2015  . Alcohol intoxication ([place]) 09/17/2015  . Depression 09/17/2015  . Benign essential HTN 09/17/2015  . Hypokalemia 09/17/2015  . Hyponatremia 09/17/2015  . Coffee ground emesis 09/17/2015  . COPD (chronic obstructive pulmonary disease) ([place]) 02/17/2013  . GERD (gastroesophageal reflux disease) 02/17/2013    Past Surgical History:  Procedure Laterality Date  . BACK SURGERY     3 cervical spine surgeries C4-C5 fused  . COLONOSCOPY N/A 01/04/2014   Procedure: COLONOSCOPY;  Surgeon: Danie Binder, MD;  Location: [facility] ENDO SUITE;  Service: Endoscopy;  Laterality: N/A;  1:45  . ESOPHAGOGASTRODUODENOSCOPY N/A 01/04/2014   Procedure: ESOPHAGOGASTRODUODENOSCOPY (EGD);  Surgeon: Carlyon Prows  Rexene Edison, MD;  Location: [facility] ENDO SUITE;  Service: Endoscopy;  Laterality: N/A;  . FINGER SURGERY Left    2nd, 3rd, & 4th fingers were cut off by table saw and reattached  . GASTRECTOMY    . HERNIA REPAIR    . INCISIONAL HERNIA REPAIR N/A 01/20/2014   Procedure: LAPAROSCOPIC RECURRENT  INCISIONAL HERNIA with mesh;   Surgeon: Edward Jolly, MD;  Location: [facility] ORS;  Service: General;  Laterality: N/A;  . rt knee arthroscopic surgery    . SHOULDER SURGERY Bilateral    3 surgeries on on left, 2 surgeries on right        Family History  Problem Relation Age of Onset  . Cancer Father        bone  . Cancer Brother        lungs  . Stroke Maternal Grandmother   . Asthma Son        died at age 55 in his sleep   . Spina bifida Son        died at age 9   . Dementia Mother   . Colon cancer Neg Hx     Social History   Tobacco Use  . Smoking status: Current Every Day Smoker    Packs/day: 1.00    Years: 52.00    Pack years: 52.00    Types: Cigarettes    Start date: 07/30/1966  . Smokeless tobacco: Never Used  . Tobacco comment: uses nicotine patch  Substance Use Topics  . Alcohol use: Yes    Alcohol/week: 15.0 standard drinks    Types: 15 Cans of beer per week    Comment: last drink 2 days ago  . Drug use: No    Comment: denied using any drugs    Home Medications Prior to Admission medications   Medication Sig Start Date End Date Taking? Authorizing Provider  albuterol (VENTOLIN HFA) 108 (90 Base) MCG/ACT inhaler Inhale 2 puffs into the lungs every 4 (four) hours as needed for wheezing or shortness of breath. 07/15/19   [name], NP  budesonide-formoterol (SYMBICORT) 160-4.5 MCG/ACT inhaler Inhale 2 puffs into the lungs 2 (two) times daily. 07/15/19   [name], NP  doxycycline (VIBRAMYCIN) 100 MG capsule Take 1 capsule (100 mg total) by mouth 2 (two) times daily. Patient not taking: Reported on 07/15/2019 06/26/19   Horton, Barbette Hair, MD  gabapentin (NEURONTIN) 300 MG capsule Take 1 capsule (300 mg total) by mouth 3 (three) times daily. 07/15/19   [name], NP  hydrOXYzine (ATARAX/VISTARIL) 25 MG tablet Take 1 tablet (25 mg total) by mouth 3 (three) times daily as needed for anxiety. 07/15/19   [name], NP  Multiple Vitamin (MULTIVITAMIN WITH  MINERALS) TABS tablet Take 1 tablet by mouth daily. Patient not taking: Reported on 06/26/2019 06/18/19   Rankin, Shuvon B, NP  sertraline (ZOLOFT) 25 MG tablet Take 1 tablet (25 mg total) by mouth daily. 07/15/19   [name], NP  traZODone (DESYREL) 50 MG tablet Take 1 tablet (50 mg total) by mouth at bedtime as needed for sleep. 07/15/19   [name], NP    Allergies    Bee venom, Penicillins, and Vancomycin  Review of Systems   Review of Systems  Reason unable to perform ROS: acuity of condition.    Physical Exam Updated Vital Signs BP (!) 130/107 (BP Location: Right Arm)   Pulse 85   Temp (!) 87.9 F (31.1 C) (Rectal)  Resp (!) 24   Ht 5\' 2"  (1.575 m)   Wt 68 kg   SpO2 100%   BMI 27.42 kg/m   Physical Exam Vitals and nursing note reviewed.  Constitutional:      Appearance: He is well-developed. He is ill-appearing.  HENT:     Head: Normocephalic and atraumatic.  Eyes:     Extraocular Movements: Extraocular movements intact.     Conjunctiva/sclera: Conjunctivae normal.     Pupils: Pupils are equal, round, and reactive to light.  Cardiovascular:     Rate and Rhythm: Normal rate and regular rhythm.     Heart sounds: No murmur.  Pulmonary:     Effort: Pulmonary effort is normal.     Breath sounds: Wheezing and rales present.  Abdominal:     Palpations: Abdomen is soft.     Tenderness: There is no abdominal tenderness. There is no guarding.  Musculoskeletal:     Cervical back: Neck supple.     Right lower leg: No edema.     Left lower leg: No edema.  Skin:    General: Skin is dry.     Capillary Refill: Capillary refill takes less than 2 seconds.     Comments: Skin is cold to touch  Neurological:     Mental Status: He is alert.     Comments: Alert, oriented to self and place but not date. Able to answer only simple questions but speech seems clear. No obvious facial droop. Moving all extremities without obvious focal deficit     ED Results  / Procedures / Treatments   Labs (all labs ordered are listed, but only abnormal results are displayed) Labs Reviewed  CBC WITH DIFFERENTIAL/PLATELET - Abnormal; Notable for the following components:      Result Value   WBC 17.9 (*)    RBC 5.90 (*)    RDW 16.5 (*)    Platelets 428 (*)    Neutro Abs 14.2 (*)    Monocytes Absolute 1.8 (*)    Abs Immature Granulocytes 0.09 (*)    All other components within normal limits  COMPREHENSIVE METABOLIC PANEL - Abnormal; Notable for the following components:   CO2 14 (*)    Glucose, Bld 128 (*)    Calcium 8.7 (*)    Anion gap 19 (*)    All other components within normal limits  LACTIC ACID, PLASMA - Abnormal; Notable for the following components:   Lactic Acid, Venous 7.5 (*)    All other components within normal limits  URINALYSIS, ROUTINE W REFLEX MICROSCOPIC - Abnormal; Notable for the following components:   Ketones, ur 5 (*)    All other components within normal limits  ETHANOL - Abnormal; Notable for the following components:   Alcohol, Ethyl (B) 274 (*)    All other components within normal limits  CBG MONITORING, ED - Abnormal; Notable for the following components:   Glucose-Capillary 113 (*)    All other components within normal limits  CULTURE, BLOOD (ROUTINE X 2)  CULTURE, BLOOD (ROUTINE X 2)  URINE CULTURE  SARS CORONAVIRUS 2 (TAT 6-24 HRS)  CK  LACTIC ACID, PLASMA  RAPID URINE DRUG SCREEN, HOSP PERFORMED  SALICYLATE LEVEL  ACETAMINOPHEN LEVEL  MAGNESIUM  PHOSPHORUS  POC SARS CORONAVIRUS 2 AG -  ED  I-STAT VENOUS BLOOD GAS, ED  TROPONIN I (HIGH SENSITIVITY)  TROPONIN I (HIGH SENSITIVITY)    EKG EKG Interpretation  Date/Time:  Saturday August 01 2019 12:00:40 EST Ventricular Rate:  190 PR  Interval:    QRS Duration: 70 QT Interval:  215 QTC Calculation: 286 R Axis:   81 Text Interpretation: Supraventricular tachycardia Ventricular bigeminy Borderline right axis deviation Nonspecific T abnrm, anterolateral  leads Artifact in lead(s) I II III aVR aVL aVF V1 V2 V3 V4 V5 V6 Confirmed by Pattricia Boss [phone]) on 08/01/2019 12:16:49 PM   Radiology DG Chest Portable 1 View  Result Date: 08/01/2019 CLINICAL DATA:  Hypothermia. EXAM: PORTABLE CHEST 1 VIEW COMPARISON:  Chest radiograph 07/15/2019 FINDINGS: Heart size within normal limits. No airspace consolidation. No evidence of pleural effusion or pneumothorax. No acute bony abnormality is identified. Surgical clips project in the region of the GE junction. Overlying cardiac monitoring leads. IMPRESSION: No evidence of acute cardiopulmonary abnormality. Electronically Signed   By: Kellie Simmering DO   On: 08/01/2019 10:52    Procedures Procedures (including critical care time) CRITICAL CARE Performed by: Rodney Booze   Total critical care time: 33 minutes  Critical care time was exclusive of separately billable procedures and treating other patients.  Critical care was necessary to treat or prevent imminent or life-threatening deterioration.  Critical care was time spent personally by me on the following activities: development of treatment plan with patient and/or surrogate as well as nursing, discussions with consultants, evaluation of patient's response to treatment, examination of patient, obtaining history from patient or surrogate, ordering and performing treatments and interventions, ordering and review of laboratory studies, ordering and review of radiographic studies, pulse oximetry and re-evaluation of patient's condition.   Medications Ordered in ED Medications  metroNIDAZOLE (FLAGYL) IVPB 500 mg (has no administration in time range)  ceFEPIme (MAXIPIME) 2 g in sodium chloride 0.9 % 100 mL IVPB (has no administration in time range)  vancomycin (VANCOREADY) IVPB 1250 mg/250 mL (has no administration in time range)  LORazepam (ATIVAN) injection 0-4 mg (has no administration in time range)    Or  LORazepam (ATIVAN) tablet 0-4 mg (has no  administration in time range)  LORazepam (ATIVAN) injection 0-4 mg (has no administration in time range)    Or  LORazepam (ATIVAN) tablet 0-4 mg (has no administration in time range)  thiamine tablet 100 mg (has no administration in time range)    Or  thiamine (B-1) injection 100 mg (has no administration in time range)  vancomycin (VANCOREADY) IVPB 750 mg/150 mL (has no administration in time range)  sodium chloride 0.9 % bolus 1,000 mL (0 mLs Intravenous Stopped 08/01/19 1340)  sodium chloride 0.9 % bolus 1,000 mL (1,000 mLs Intravenous New Bag/Given 08/01/19 1352)    ED Course  I have reviewed the triage vital signs and the nursing notes.  Pertinent labs & imaging results that were available during my care of the patient were reviewed by me and considered in my medical decision making (see chart for details).    MDM Rules/Calculators/A&P                     64 year old male presenting for evaluation of hypothermia.  EMS was contacted by passerby after patient was found sleeping in the mud with wet clothes from the rain last night.  On their arrival his temperature and pulse ox were undetectable and he was not very responsive.  In route to the ED became more responsive.  On arrival patient noted to have a rectal temp of 52F, with normal blood pressure and pulse ox.  Saturating well on room air.  No respiratory distress.  Code sepsis was initiated given  patient's elevated lactic acid, elevated white blood cell count and hypothermia.  I do have suspicion that his hypothermia is likely related to cold exposure however given patient's unreliable, elevated lactic acid, elevated WBC count, and hypothermia sepsis was initiated, IV fluids were given and broad-spectrum antibiotics were also given.   Reviewed labs, Initial CBG normal CBC with WBC count elevated at 17,000, mild thrombocytosis.  No anemia. CMP with low bicarb 14, elevated anion gap, otherwise reassuring CK is within normal  limits EtOH is elevated above 200 Troponin negative Lactic acid elevated at 7 Point-of-care Covid testing is negative  Patient was resuscitated with warm IV fluids and also placed on a Bair hugger.  On reassessment he appears much more awake and is alert.  He is able to participate in a neurologic exam and he does appear to be neurologically intact at this time.  He admits to drinking 14 beers last night to celebrate New Year's.  He denies any drug use and denies any intent to harm himself.  He does endorse wanting to stop drinking alcohol.  His temperature is showing some improvement with repeat temp and 87.9 however he will require admission for further treatment. Discussed plan for admission and he is in agreement.  Dr. Jeanell Sparrow, attending physician, spoke with admitting team and they accept pt for admission.   Admitted to internal medicine resident teaching service  Final Clinical Impression(s) / ED Diagnoses Final diagnoses:  None    Rx / DC Orders ED Discharge Orders    None       Rodney Booze, PA-C 08/01/19 1356    Carmino Ocain S, PA-C 08/01/19 1358    Pattricia Boss, MD 08/03/19 1538

## 2019-08-02 DIAGNOSIS — R1013 Epigastric pain: Secondary | ICD-10-CM

## 2019-08-02 DIAGNOSIS — E872 Acidosis: Secondary | ICD-10-CM

## 2019-08-02 DIAGNOSIS — R519 Headache, unspecified: Secondary | ICD-10-CM

## 2019-08-02 DIAGNOSIS — F1094 Alcohol use, unspecified with alcohol-induced mood disorder: Secondary | ICD-10-CM

## 2019-08-02 DIAGNOSIS — E8729 Other acidosis: Secondary | ICD-10-CM

## 2019-08-02 DIAGNOSIS — R1310 Dysphagia, unspecified: Secondary | ICD-10-CM

## 2019-08-02 LAB — COMPREHENSIVE METABOLIC PANEL
ALT: 21 U/L (ref 0–44)
AST: 25 U/L (ref 15–41)
Albumin: 2.6 g/dL — ABNORMAL LOW (ref 3.5–5.0)
Alkaline Phosphatase: 58 U/L (ref 38–126)
Anion gap: 8 (ref 5–15)
BUN: 10 mg/dL (ref 8–23)
CO2: 20 mmol/L — ABNORMAL LOW (ref 22–32)
Calcium: 7.5 mg/dL — ABNORMAL LOW (ref 8.9–10.3)
Chloride: 111 mmol/L (ref 98–111)
Creatinine, Ser: 0.82 mg/dL (ref 0.61–1.24)
GFR calc Af Amer: >60 mL/min (ref >60)
GFR calc non Af Amer: >60 mL/min (ref >60)
Glucose, Bld: 79 mg/dL (ref 70–99)
Potassium: 3.8 mmol/L (ref 3.5–5.1)
Sodium: 139 mmol/L (ref 135–145)
Total Bilirubin: 0.5 mg/dL (ref 0.3–1.2)
Total Protein: 5 g/dL — ABNORMAL LOW (ref 6.5–8.1)

## 2019-08-02 LAB — CBC
HCT: 33.6 % — ABNORMAL LOW (ref 39.0–52.0)
Hemoglobin: 11 g/dL — ABNORMAL LOW (ref 13.0–17.0)
MCH: 27 pg (ref 26.0–34.0)
MCHC: 32.7 g/dL (ref 30.0–36.0)
MCV: 82.6 fL (ref 80.0–100.0)
Platelets: 285 10*3/uL (ref 150–400)
RBC: 4.07 MIL/uL — ABNORMAL LOW (ref 4.22–5.81)
RDW: 16.5 % — ABNORMAL HIGH (ref 11.5–15.5)
WBC: 8.9 10*3/uL (ref 4.0–10.5)
nRBC: 0 % (ref 0.0–0.2)

## 2019-08-02 LAB — URINE CULTURE

## 2019-08-02 LAB — LIPASE, BLOOD: Lipase: 22 U/L (ref 11–51)

## 2019-08-02 LAB — LACTIC ACID, PLASMA: Lactic Acid, Venous: 0.8 mmol/L (ref 0.5–1.9)

## 2019-08-02 MED ORDER — ACETAMINOPHEN 325 MG PO TABS
650.0000 mg | ORAL_TABLET | Freq: Four times a day (QID) | ORAL | Status: DC | PRN
  Administered 2019-08-02 – 2019-08-04 (×5): 650 mg via ORAL
  Filled 2019-08-02 (×5): qty 2

## 2019-08-02 MED ORDER — ADULT MULTIVITAMIN W/MINERALS CH
1.0000 | ORAL_TABLET | Freq: Every day | ORAL | Status: DC
  Administered 2019-08-03 – 2019-08-04 (×2): 1 via ORAL
  Filled 2019-08-02 (×2): qty 1

## 2019-08-02 MED ORDER — ALUM & MAG HYDROXIDE-SIMETH 200-200-20 MG/5ML PO SUSP
30.0000 mL | Freq: Once | ORAL | Status: AC
  Administered 2019-08-02: 10:00:00 30 mL via ORAL
  Filled 2019-08-02: qty 30

## 2019-08-02 MED ORDER — ONDANSETRON 4 MG PO TBDP
4.0000 mg | ORAL_TABLET | Freq: Once | ORAL | Status: AC
  Administered 2019-08-02: 21:00:00 4 mg via ORAL
  Filled 2019-08-02: qty 1

## 2019-08-02 MED ORDER — KETOROLAC TROMETHAMINE 15 MG/ML IJ SOLN
15.0000 mg | Freq: Once | INTRAMUSCULAR | Status: AC
  Administered 2019-08-02: 11:00:00 15 mg via INTRAVENOUS
  Filled 2019-08-02: qty 1

## 2019-08-02 MED ORDER — FOLIC ACID 1 MG PO TABS
1.0000 mg | ORAL_TABLET | Freq: Every day | ORAL | Status: DC
  Administered 2019-08-03 – 2019-08-04 (×2): 1 mg via ORAL
  Filled 2019-08-02 (×2): qty 1

## 2019-08-02 MED ORDER — PANTOPRAZOLE SODIUM 40 MG PO TBEC
40.0000 mg | DELAYED_RELEASE_TABLET | Freq: Every day | ORAL | Status: DC
  Administered 2019-08-02 – 2019-08-04 (×3): 40 mg via ORAL
  Filled 2019-08-02 (×3): qty 1

## 2019-08-02 MED ORDER — THIAMINE HCL 100 MG PO TABS
100.0000 mg | ORAL_TABLET | Freq: Every day | ORAL | Status: DC
  Administered 2019-08-03 – 2019-08-04 (×2): 100 mg via ORAL
  Filled 2019-08-02 (×2): qty 1

## 2019-08-02 NOTE — Consult Note (Signed)
Telepsych Consultation   Reason for Consult:  ''active suicidal ideation'' Referring Physician:  Joni Reining, DO Location of Patient:  Location of Provider: [facility]  Patient Identification: Samuel Reyes MRN:  [id number] Principal Diagnosis: Alcohol use with alcohol-induced mood disorder ([place]) Diagnosis:  Principal Problem:   Alcohol use with alcohol-induced mood disorder ([place]) Active Problems:   Alcohol use disorder, severe, dependence ([place])   Major depressive disorder, recurrent episode, moderate with anxious distress (HCC)   COPD (chronic obstructive pulmonary disease) ([place])   Malnutrition of moderate degree   Hypothermia   High anion gap metabolic acidosis   Total Time spent with patient: 45 minutes  Subjective:   Samuel Reyes is a 65 y.o. male patient admitted with hypothermia.  HPI:  Samuel Reyes is a 39 year oldmale who is well known to psychiatric service due to multiple ER visits due to chronic alcoholism with frequent intoxication. He has medical history significant for alcohol use disorder-severe, delirium tremens, Barrett's esophagus, coronary artery disease, COPD, GERD, headache, hypertension, CVA and was brought to the ED via EMS after he was found to be laying outside in the mud right next to empty beer bottles. Patient states that he lives in a boarding home with other 2 people. He reports a long history of depression, alcoholism following the death of his son and spouse many years ago. Today, he reports being anxious, tremulous, depressed, hopeless, paranoid, suicidal with the plan to jump in front of a moving vehicle. He denies psychosis and unable to contract for safety.  Past Psychiatric History: as above  Risk to Self:   suicidal with plan  Risk to Others:   denies Prior Inpatient Therapy:  multiple ED visits Prior Outpatient Therapy:  [facility] but he is non-compliant  Past Medical History:  Past Medical History:  Diagnosis Date  .  Alcoholism ([place])   . Barrett's esophagus   . CAD (coronary artery disease) 2002   MI, no intervention required  . COPD (chronic obstructive pulmonary disease) (HCC)    not on home O2  . Depression   . DTs (delirium tremens) ([place])   . GERD (gastroesophageal reflux disease)   . Headache   . Homeless   . Hypertension   . Stroke [facility]) 2006    Past Surgical History:  Procedure Laterality Date  . BACK SURGERY     3 cervical spine surgeries C4-C5 fused  . COLONOSCOPY N/A 01/04/2014   Procedure: COLONOSCOPY;  Surgeon: Danie Binder, MD;  Location: [facility] ENDO SU[facility];  Service: Endoscopy;  Laterality: N/A;  1:45  . ESOPHAGOGASTRODUODENOSCOPY N/A 01/04/2014   Procedure: ESOPHAGOGASTRODUODENOSCOPY (EGD);  Surgeon: Danie Binder, MD;  Location: [facility] END[facility] SU[facility];  Service: Endoscopy;  Laterality: N/A;  . FINGER SURGERY Left    2nd, 3rd, & 4th fingers were cut off by table saw and reattached  . GASTRECTOMY    . HERNIA REPAIR    . INCISIONAL HERNIA REPAIR N/A 01/20/2014   Procedure: LAPAROSCOPIC RECURRENT  INCISIONAL HERNIA with mesh;  Surgeon: Edward Jolly, MD;  Location: [facility] ORS;  Service: General;  Laterality: N/A;  . rt knee arthroscopic surgery    . SHOULDER SURGERY Bilateral    3 surgeries on on left, 2 surgeries on right    Family History:  Family History  Problem Relation Age of Onset  . Cancer Father        bone  . Cancer Brother        lungs  . Stroke Maternal Grandmother   .  Asthma Son        died at age 48 in his sleep   . Spina bifida Son        died at age 82   . Dementia Mother   . Colon cancer Neg Hx    Family Psychiatric  History:  Social History:  Social History   Substance and Sexual Activity  Alcohol Use Yes  . Alcohol/week: 15.0 standard drinks  . Types: 15 Cans of beer per week   Comment: last drink 2 days ago     Social History   Substance and Sexual Activity  Drug Use No   Comment: denied using any drugs    Social History   Socioeconomic History  .  Marital status: Widowed    Spouse name: Not on file  . Number of children: 3  . Years of education: Not on file  . Highest education level: Not on file  Occupational History  . Occupation: Disability  Tobacco Use  . Smoking status: Current Every Day Smoker    Packs/day: 1.00    Years: 52.00    Pack years: 52.00    Types: Cigarettes    Start date: 07/30/1966  . Smokeless tobacco: Never Used  . Tobacco comment: uses nicotine patch  Substance and Sexual Activity  . Alcohol use: Yes    Alcohol/week: 15.0 standard drinks    Types: 15 Cans of beer per week    Comment: last drink 2 days ago  . Drug use: No    Comment: denied using any drugs  . Sexual activity: Not Currently  Other Topics Concern  . Not on file  Social History Narrative  . Not on file   Social Determinants of Health   Financial Resource Strain:   . Difficulty of Paying Living Expenses: Not on file  Food Insecurity:   . Worried About Charity fundraiser in the Last Year: Not on file  . Ran Out of Food in the Last Year: Not on file  Transportation Needs:   . Lack of Transportation (Medical): Not on file  . Lack of Transportation (Non-Medical): Not on file  Physical Activity:   . Days of Exercise per Week: Not on file  . Minutes of Exercise per Session: Not on file  Stress:   . Feeling of Stress : Not on file  Social Connections:   . Frequency of Communication with Friends and Family: Not on file  . Frequency of Social Gatherings with Friends and Family: Not on file  . Attends Religious Services: Not on file  . Active Member of Clubs or Organizations: Not on file  . Attends Archivist Meetings: Not on file  . Marital Status: Not on file   Additional Social History:    Allergies:   Allergies  Allergen Reactions  . Bee Venom Anaphylaxis  . Penicillins Rash    Has patient had a PCN reaction causing immediate rash, facial/tongue/throat swelling, SOB or lightheadedness with hypotension: {Yes Has  patient had a PCN reaction causing severe rash involving mucus membranes or skin necrosis: YES Has patient had a PCN reaction that required hospitalization Yes Has patient had a PCN reaction occurring within the last 10 years: YES If all of the above answers are "NO", then may proceed with Cephalosporin use.   . Vancomycin Tinitus    Pt states he "almost went deaf" - does not want to take vanc    Labs:  Results for orders placed or performed during the hospital  encounter of 08/01/19 (from the past 48 hour(s))  POC CBG, ED     Status: Abnormal   Collection Time: 08/01/19 10:21 AM  Result Value Ref Range   Glucose-Capillary 113 (H) 70 - 99 mg/dL  CBC with Differential     Status: Abnormal   Collection Time: 08/01/19 10:31 AM  Result Value Ref Range   WBC 17.9 (H) 4.0 - 10.5 K/uL   RBC 5.90 (H) 4.22 - 5.81 MIL/uL   Hemoglobin 15.6 13.0 - 17.0 g/dL   HCT 51.9 39.0 - 52.0 %   MCV 88.0 80.0 - 100.0 fL   MCH 26.4 26.0 - 34.0 pg   MCHC 30.1 30.0 - 36.0 g/dL   RDW 16.5 (H) 11.5 - 15.5 %   Platelets 428 (H) 150 - 400 K/uL   nRBC 0.0 0.0 - 0.2 %   Neutrophils Relative % 79 %   Neutro Abs 14.2 (H) 1.7 - 7.7 K/uL   Lymphocytes Relative 10 %   Lymphs Abs 1.7 0.7 - 4.0 K/uL   Monocytes Relative 10 %   Monocytes Absolute 1.8 (H) 0.1 - 1.0 K/uL   Eosinophils Relative 0 %   Eosinophils Absolute 0.0 0.0 - 0.5 K/uL   Basophils Relative 0 %   Basophils Absolute 0.1 0.0 - 0.1 K/uL   Immature Granulocytes 1 %   Abs Immature Granulocytes 0.09 (H) 0.00 - 0.07 K/uL    Comment: Performed at [facility], [address], [place], [place] [address]  Comprehensive metabolic panel     Status: Abnormal   Collection Time: 08/01/19 10:31 AM  Result Value Ref Range   Sodium 136 135 - 145 mmol/L   Potassium 4.0 3.5 - 5.1 mmol/L    Comment: SLIGHT HEMOLYSIS   Chloride 103 98 - 111 mmol/L   CO2 14 (L) 22 - 32 mmol/L   Glucose, Bld 128 (H) 70 - 99 mg/dL   BUN 12 8 - 23 mg/dL   Creatinine, Ser 0.79  0.61 - 1.24 mg/dL   Calcium 8.7 (L) 8.9 - 10.3 mg/dL   Total Protein 7.4 6.5 - 8.1 g/dL   Albumin 4.0 3.5 - 5.0 g/dL   AST 36 15 - 41 U/L   ALT 18 0 - 44 U/L   Alkaline Phosphatase 90 38 - 126 U/L   Total Bilirubin 0.5 0.3 - 1.2 mg/dL   GFR calc non Af Amer >60 >60 mL/min   GFR calc Af Amer >60 >60 mL/min   Anion gap 19 (H) 5 - 15    Comment: Performed at [facility] [address], [place], [place] [address]  CK     Status: None   Collection Time: 08/01/19 10:31 AM  Result Value Ref Range   Total CK 210 49 - 397 U/L    Comment: Performed at [facility], [place] [address] [place], [place] [address]  Lactic acid, plasma     Status: Abnormal   Collection Time: 08/01/19 10:31 AM  Result Value Ref Range   Lactic Acid, Venous 7.5 (HH) 0.5 - 1.9 mmol/L    Comment: CRITICAL RESULT CALLED TO, READ BACK BY AND VERIFIED WITH: B.MICHALSON RN @ 1124 08/01/2019 BY C.EDENS Performed at [facility], [place] [address] [place], [place] [address]   Ethanol     Status: Abnormal   Collection Time: 08/01/19 10:31 AM  Result Value Ref Range   Alcohol, Ethyl (B) 274 (H) <10 mg/dL    Comment: (NOTE) Lowest  detectable limit for serum alcohol is 10 mg/dL. For medical purposes only. Performed at [facility], [place] [address], [place], [place] [address]   Troponin I (High Sensitivity)     Status: None   Collection Time: 08/01/19 10:31 AM  Result Value Ref Range   Troponin I (High Sensitivity) 10 <18 ng/L    Comment: (NOTE) Elevated high sensitivity troponin I (hsTnI) values and significant  changes across serial measurements may suggest ACS but many other  chronic and acute conditions are known to elevate hsTnI results.  Refer to the Links section for chest pain algorithms and additional  guidance. Performed at [facility], [place] [address] [place], [place] [address]   Blood culture (routine x 2)     Status: None (Preliminary result)   Collection Time: 08/01/19 10:56  AM   Specimen: BLOOD RIGHT HAND  Result Value Ref Range   Specimen Description BLOOD RIGHT HAND    Special Requests      BOTTLES DRAWN AEROBIC ONLY Blood Culture results may not be optimal due to an inadequate volume of blood received in culture bottles   Culture      NO GROWTH < 24 HOURS Performed at [facility] [address], [place], [place] [address]    Report Status PENDING   POC SARS Coronavirus 2 Ag-ED - Nasal Swab (BD Veritor Kit)     Status: None   Collection Time: 08/01/19 11:26 AM  Result Value Ref Range   SARS Coronavirus 2 Ag NEGATIVE NEGATIVE    Comment: (NOTE) SARS-CoV-2 antigen NOT DETECTED.  Negative results are presumptive.  Negative results do not preclude SARS-CoV-2 infection and should not be used as the sole basis for treatment or other patient management decisions, including infection  control decisions, particularly in the presence of clinical signs and  symptoms consistent with COVID-19, or in those who have been in contact with the virus.  Negative results must be combined with clinical observations, patient history, and epidemiological information. The expected result is Negative. Fact Sheet for Patients: PodPark.tn Fact Sheet for Healthcare Providers: GiftContent.is This test is not yet approved or cleared by the [place] FDA and  has been authorized for detection and/or diagnosis of SARS-CoV-2 by FDA under an Emergency Use Authorization (EUA).  This EUA will remain in effect (meaning this test can be used) for the duration of  the COVID-19 de claration under Section 564(b)(1) of the Act, 21 U.S.C. section 360bbb-3(b)(1), unless the authorization is terminated or revoked sooner.   Urine culture     Status: Abnormal   Collection Time: 08/01/19 11:44 AM   Specimen: In/Out Cath Urine  Result Value Ref Range   Specimen Description IN/OUT CATH URINE    Special Requests       NONE Performed at [facility], [address] [place], [place] [address]    Culture MULTIPLE SPECIES PRESENT, SUGGEST RECOLLECTION (A)    Report Status 08/02/2019 FINAL   Urinalysis, Routine w reflex microscopic     Status: Abnormal   Collection Time: 08/01/19 12:17 PM  Result Value Ref Range   Color, Urine YELLOW YELLOW   APPearance CLEAR CLEAR   Specific Gravity, Urine 1.008 1.005 - 1.030   pH 5.0 5.0 - 8.0   Glucose, UA NEGATIVE NEGATIVE mg/dL   Hgb urine dipstick NEGATIVE NEGATIVE   Bilirubin Urine NEGATIVE NEGATIVE   Ketones, ur 5 (A) NEGATIVE mg/dL   Protein, ur NEGATIVE NEGATIVE mg/dL  Nitrite NEGATIVE NEGATIVE   Leukocytes,Ua NEGATIVE NEGATIVE    Comment: Performed at [facility], [place] [address] [place], [place] [address]  Rapid urine drug screen (hospital performed)     Status: None   Collection Time: 08/01/19 12:17 PM  Result Value Ref Range   Opiates NONE DETECTED NONE DETECTED   Cocaine NONE DETECTED NONE DETECTED   Benzodiazepines NONE DETECTED NONE DETECTED   Amphetamines NONE DETECTED NONE DETECTED   Tetrahydrocannabinol NONE DETECTED NONE DETECTED   Barbiturates NONE DETECTED NONE DETECTED    Comment: (NOTE) DRUG SCREEN FOR MEDICAL PURPOSES ONLY.  IF CONFIRMATION IS NEEDED FOR ANY PURPOSE, NOTIFY LAB WITHIN 5 DAYS. LOWEST DETECTABLE LIMITS FOR URINE DRUG SCREEN Drug Class                     Cutoff (ng/mL) Amphetamine and metabolites    1000 Barbiturate and metabolites    200 Benzodiazepine                 800 Tricyclics and metabolites     300 Opiates and metabolites        300 Cocaine and metabolites        300 THC                            50 Performed at [facility], [place] [address], [place], [place] [address]   Lactic acid, plasma     Status: Abnormal   Collection Time: 08/01/19  2:08 PM  Result Value Ref Range   Lactic Acid, Venous 3.9 (HH) 0.5 - 1.9 mmol/L    Comment: CRITICAL VALUE NOTED.  VALUE IS CONSISTENT WITH  PREVIOUSLY REPORTED AND CALLED VALUE. Performed at [facility], [place] [address] [place], [place] [address]   Troponin I (High Sensitivity)     Status: None   Collection Time: 08/01/19  2:08 PM  Result Value Ref Range   Troponin I (High Sensitivity) 17 <18 ng/L    Comment: HEMOLYSIS AT THIS LEVEL MAY AFFECT RESULT (NOTE) Elevated high sensitivity troponin I (hsTnI) values and significant  changes across serial measurements may suggest ACS but many other  chronic and acute conditions are known to elevate hsTnI results.  Refer to the Links section for chest pain algorithms and additional  guidance. Performed at [facility], [place] [address], [place], [place] [address]   Salicylate level     Status: Abnormal   Collection Time: 08/01/19  2:08 PM  Result Value Ref Range   Salicylate Lvl <8.0 (L) 7.0 - 30.0 mg/dL    Comment: Performed at [facility] [address], [place], [place] [address]  Acetaminophen level     Status: Abnormal   Collection Time: 08/01/19  2:08 PM  Result Value Ref Range   Acetaminophen (Tylenol), Serum <10 (L) 10 - 30 ug/mL    Comment: (NOTE) Therapeutic concentrations vary significantly. A range of 10-30 ug/mL  may be an effective concentration for many patients. However, some  are best treated at concentrations outside of this range. Acetaminophen concentrations >150 ug/mL at 4 hours after ingestion  and >50 ug/mL at 12 hours after ingestion are often associated with  toxic reactions. Performed at [facility], [place] [address], [place], [place] [address]   SARS CORONAVIRUS 2 (TAT 6-24 HRS) Nasopharyngeal Nasopharyngeal Swab     Status: None   Collection Time: 08/01/19  2:08 PM   Specimen: Nasopharyngeal Swab  Result Value Ref Range   SARS Coronavirus 2 NEGATIVE NEGATIVE    Comment: (NOTE) SARS-CoV-2 target nucleic acids are NOT DETECTED. The SARS-CoV-2 RNA is generally detectable in upper and lower respiratory specimens during the  acute phase of infection. Negative results do not preclude SARS-CoV-2 infection, do not rule out co-infections with other pathogens, and should not be used as the sole basis for treatment or other patient management decisions. Negative results must be combined with clinical observations, patient history, and epidemiological information. The expected result is Negative. Fact Sheet for Patients: SugarRoll.be Fact Sheet for Healthcare Providers: https://www.woods-mathews.com/ This test is not yet approved or cleared by the [place] FDA and  has been authorized for detection and/or diagnosis of SARS-CoV-2 by FDA under an Emergency Use Authorization (EUA). This EUA will remain  in effect (meaning this test can be used) for the duration of the COVID-19 declaration under Section 56 4(b)(1) of the Act, 21 U.S.C. section 360bbb-3(b)(1), unless the authorization is terminated or revoked sooner. Performed at [facility], [place] [address] [place], [place] [address]   Magnesium     Status: None   Collection Time: 08/01/19  2:08 PM  Result Value Ref Range   Magnesium 1.9 1.7 - 2.4 mg/dL    Comment: Performed at [facility], [place] [address] [place], [place] [address]  Phosphorus     Status: None   Collection Time: 08/01/19  2:08 PM  Result Value Ref Range   Phosphorus 3.9 2.5 - 4.6 mg/dL    Comment: HEMOLYSIS AT THIS LEVEL MAY AFFECT RESULT Performed at [facility], [place] [address], [place], [place] [address]   Blood culture (routine x 2)     Status: None (Preliminary result)   Collection Time: 08/01/19  2:15 PM   Specimen: BLOOD  Result Value Ref Range   Specimen Description BLOOD LEFT ANTECUBITAL    Special Requests      BOTTLES DRAWN AEROBIC ONLY Blood Culture results may not be optimal due to an inadequate volume of blood received in culture bottles   Culture      NO GROWTH < 24 HOURS Performed at [facility] [address], [place], [place] [address]    Report Status PENDING   POCT I-Stat EG7     Status: Abnormal   Collection Time: 08/01/19  2:38 PM  Result Value Ref Range   pH, Ven 7.292 7.250 - 7.430   pCO2, Ven 44.7 44.0 - 60.0 mmHg   pO2, Ven 73.0 (H) 32.0 - 45.0 mmHg   Bicarbonate 21.6 20.0 - 28.0 mmol/L   TCO2 23 22 - 32 mmol/L   O2 Saturation 92.0 %   Acid-base deficit 5.0 (H) 0.0 - 2.0 mmol/L   Sodium 138 135 - 145 mmol/L   Potassium 5.0 3.5 - 5.1 mmol/L   Calcium, Ion 0.98 (L) 1.15 - 1.40 mmol/L   HCT 43.0 39.0 - 52.0 %   Hemoglobin 14.6 13.0 - 17.0 g/dL   Patient temperature HIDE    Sample type VENOUS   Lactic acid, plasma     Status: Abnormal   Collection Time: 08/01/19  9:37 PM  Result Value Ref Range   Lactic Acid, Venous 3.2 (HH) 0.5 - 1.9 mmol/L    Comment: CRITICAL VALUE NOTED.  VALUE IS CONSISTENT WITH PREVIOUSLY REPORTED AND CALLED VALUE. Performed at [facility], [place] [address], [place], [place] [address]  Comprehensive metabolic panel     Status: Abnormal   Collection Time: 08/02/19  5:25 AM  Result Value Ref Range   Sodium 139 135 - 145 mmol/L   Potassium 3.8 3.5 - 5.1 mmol/L   Chloride 111 98 - 111 mmol/L   CO2 20 (L) 22 - 32 mmol/L   Glucose, Bld 79 70 - 99 mg/dL   BUN 10 8 - 23 mg/dL   Creatinine, Ser 0.82 0.61 - 1.24 mg/dL   Calcium 7.5 (L) 8.9 - 10.3 mg/dL   Total Protein 5.0 (L) 6.5 - 8.1 g/dL   Albumin 2.6 (L) 3.5 - 5.0 g/dL   AST 25 15 - 41 U/L   ALT 21 0 - 44 U/L   Alkaline Phosphatase 58 38 - 126 U/L   Total Bilirubin 0.5 0.3 - 1.2 mg/dL   GFR calc non Af Amer >60 >60 mL/min   GFR calc Af Amer >60 >60 mL/min   Anion gap 8 5 - 15    Comment: Performed at [facility] [address] [place], [place] [address]  CBC     Status: Abnormal   Collection Time: 08/02/19  5:25 AM  Result Value Ref Range   WBC 8.9 4.0 - 10.5 K/uL   RBC 4.07 (L) 4.22 - 5.81 MIL/uL   Hemoglobin 11.0 (L) 13.0 - 17.0 g/dL    Comment: REPEATED TO VERIFY    HCT 33.6 (L) 39.0 - 52.0 %   MCV 82.6 80.0 - 100.0 fL   MCH 27.0 26.0 - 34.0 pg   MCHC 32.7 30.0 - 36.0 g/dL   RDW 16.5 (H) 11.5 - 15.5 %   Platelets 285 150 - 400 K/uL   nRBC 0.0 0.0 - 0.2 %    Comment: Performed at [facility], [place] [address], [place], [place] [address]  Lactic acid, plasma     Status: None   Collection Time: 08/02/19  5:25 AM  Result Value Ref Range   Lactic Acid, Venous 0.8 0.5 - 1.9 mmol/L    Comment: Performed at [facility] [address] [place], [place] [address]  Lipase, blood     Status: None   Collection Time: 08/02/19  5:25 AM  Result Value Ref Range   Lipase 22 11 - 51 U/L    Comment: Performed at [facility] [address], [place], [place] [address]    Medications:  Current Facility-Administered Medications  Medication Dose Route Frequency Provider Last Rate Last Admin  . 0.9 %  sodium chloride infusion   Intravenous Continuous Jean Rosenthal, MD 75 mL/hr at 08/02/19 1120 Rate Change at 08/02/19 1120  . acetaminophen (TYLENOL) tablet 650 mg  650 mg Oral Q6H PRN Jean Rosenthal, MD   650 mg at 08/02/19 1118  . albuterol (PROVENTIL) (2.5 MG/3ML) 0.083% nebulizer solution 3 mL  3 mL Inhalation Q4H PRN Agyei, Obed K, MD      . enoxaparin (LOVENOX) injection 40 mg  40 mg Subcutaneous Q24H Agyei, Obed K, MD      . fluticasone furoate-vilanterol (BREO ELLIPTA) 200-25 MCG/INH 1 puff  1 puff Inhalation Daily Jean Rosenthal, MD   1 puff at 08/02/19 5830  . folic acid injection 1 mg  1 mg Intravenous Daily Agyei, Obed K, MD   1 mg at 08/02/19 1052  . gabapentin (NEURONTIN) capsule 300 mg  300 mg Oral TID Jean Rosenthal, MD   300 mg at 08/02/19 0949  .  hydrOXYzine (ATARAX/VISTARIL) tablet 25 mg  25 mg Oral TID PRN Jean Rosenthal, MD      . LORazepam (ATIVAN) injection 0-4 mg  0-4 mg Intravenous Q6H Agyei, Obed K, MD   1 mg at 08/01/19 1625   Or  . LORazepam (ATIVAN) tablet 0-4 mg  0-4 mg Oral Q6H Agyei, Obed K, MD      . Derrill Memo ON 08/03/2019]  LORazepam (ATIVAN) injection 0-4 mg  0-4 mg Intravenous Q12H Agyei, Obed K, MD       Or  . Derrill Memo ON 08/03/2019] LORazepam (ATIVAN) tablet 0-4 mg  0-4 mg Oral Q12H Agyei, Obed K, MD      . pantoprazole (PROTONIX) EC tablet 40 mg  40 mg Oral Daily Agyei, Obed K, MD   40 mg at 08/02/19 0949  . sertraline (ZOLOFT) tablet 25 mg  25 mg Oral Daily Jean Rosenthal, MD   25 mg at 08/02/19 0950  . thiamine tablet 100 mg  100 mg Oral Daily Agyei, Obed K, MD   100 mg at 08/02/19 0949   Or  . thiamine (B-1) injection 100 mg  100 mg Intravenous Daily Agyei, Obed K, MD   100 mg at 08/01/19 1421  . traZODone (DESYREL) tablet 50 mg  50 mg Oral QHS PRN Jean Rosenthal, MD        Musculoskeletal: Strength & Muscle Tone: not tested, pt assessed via tele health [place]: not tested, patient assessed via tele health Patient leans: N/A  Psychiatric Specialty Exam: Physical Exam  Psychiatric: His speech is normal and behavior is normal. Cognition and memory are normal. He expresses impulsivity. He exhibits a depressed mood. He expresses suicidal ideation. He expresses suicidal plans.    Review of Systems  Constitutional: Positive for fatigue.  HENT: Negative.   Eyes: Negative.   Neurological: Positive for tremors.  Psychiatric/Behavioral: Positive for dysphoric mood, sleep disturbance and suicidal ideas.    Blood pressure 128/74, pulse (!) 112, temperature 98.9 F (37.2 C), temperature source Oral, resp. rate 16, height 5' 5"  (1.651 m), weight 66.2 kg, SpO2 100 %.Body mass index is 24.29 kg/m.  General Appearance: Casual and Disheveled  Eye Contact:  Good  Speech:  Clear and Coherent  Volume:  Decreased  Mood:  Depressed and Dysphoric  Affect:  Constricted  Thought Process:  Coherent and Linear  Orientation:  Full (Time, Place, and Person)  Thought Content:  Paranoid Ideation  Suicidal Thoughts:  Yes.  with intent/plan  Homicidal Thoughts:  No  Memory:  Immediate;   Good Recent;   Fair Remote;    Fair  Judgement:  Poor  Insight:  Shallow  Psychomotor Activity:  Psychomotor Retardation  Concentration:  Concentration: Fair and Attention Span: Fair  Recall:  [employer] of Knowledge:  Fair  Language:  Good  Akathisia:  No  Handed:  Right  AIMS (if indicated):     Assets:  Communication Skills Desire for Improvement  ADL's:  Intact  Cognition:  WNL  Sleep:   poor     Treatment Plan Summary: 64 year old man with past medical history of alcohol use disorder use disorder and depression who was admitted with hypothermia but reports suicidal ideation with plan to jump in front of a moving vehicle during admission. Patient believes life is not worth living and he is unable to contract for safety. He will benefit from psychiatric inpatient admission after he is medically stabilized.  Recommendations: -Continue 1:1 sitter for safety -Continue CIWA and  Lorazepam alcohol detox protocol -Continue Gabapentin 300 mg bid for alcohol withdrawal and mood -Continue Zoloft 25 mg daily for depression and Trazodone 50 mg prn for insomnia.  -Consider social worker consult to facilitate inpatient psychiatric placement.  Disposition: Recommend psychiatric Inpatient admission when medically cleared. Supportive therapy provided about ongoing stressors. Psychiatric service signing out. Re-consult as needed  This service was provided via telemedicine using a 2-way, interactive audio and video technology.  Names of all persons participating in this telemedicine service and their role in this encounter. Name: Samuel Reyes Role: Patient  Name: Nicanor Bake Role: RN  Name: Corena Pilgrim, MD Role: Psychiatrist  Name:  Role:     Corena Pilgrim, MD 08/02/2019 1:12 PM

## 2019-08-02 NOTE — Progress Notes (Signed)
Contacted AC regarding Psych I-pad for pt's psych consult, however, all psych ipads are currently "missing" per [facility].  Pt's attending MD and consulting psychiatrist were made aware of the situation.  Will continue to monitor.
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
## 2019-08-02 NOTE — Progress Notes (Signed)
Upon reading ED MD notes, it was noted that pt was voicing SI in the ED.  MD was paged and made aware that pt had expressed suicidality.  Will await futher orders and continue to monitor.

## 2019-08-03 DIAGNOSIS — F1024 Alcohol dependence with alcohol-induced mood disorder: Secondary | ICD-10-CM

## 2019-08-03 DIAGNOSIS — F331 Major depressive disorder, recurrent, moderate: Secondary | ICD-10-CM

## 2019-08-03 LAB — CBC
HCT: 34.4 % — ABNORMAL LOW (ref 39.0–52.0)
Hemoglobin: 10.8 g/dL — ABNORMAL LOW (ref 13.0–17.0)
MCH: 26.4 pg (ref 26.0–34.0)
MCHC: 31.4 g/dL (ref 30.0–36.0)
MCV: 84.1 fL (ref 80.0–100.0)
Platelets: 294 10*3/uL (ref 150–400)
RBC: 4.09 MIL/uL — ABNORMAL LOW (ref 4.22–5.81)
RDW: 17 % — ABNORMAL HIGH (ref 11.5–15.5)
WBC: 7.7 10*3/uL (ref 4.0–10.5)
nRBC: 0 % (ref 0.0–0.2)

## 2019-08-03 LAB — BASIC METABOLIC PANEL
Anion gap: 7 (ref 5–15)
BUN: 7 mg/dL — ABNORMAL LOW (ref 8–23)
CO2: 23 mmol/L (ref 22–32)
Calcium: 8.1 mg/dL — ABNORMAL LOW (ref 8.9–10.3)
Chloride: 112 mmol/L — ABNORMAL HIGH (ref 98–111)
Creatinine, Ser: 0.74 mg/dL (ref 0.61–1.24)
GFR calc Af Amer: >60 mL/min (ref >60)
GFR calc non Af Amer: >60 mL/min (ref >60)
Glucose, Bld: 106 mg/dL — ABNORMAL HIGH (ref 70–99)
Potassium: 4 mmol/L (ref 3.5–5.1)
Sodium: 142 mmol/L (ref 135–145)

## 2019-08-03 MED ORDER — LORAZEPAM 1 MG PO TABS: 1.0000 mg | ORAL_TABLET | ORAL | Status: DC | PRN

## 2019-08-03 MED ORDER — LORAZEPAM 2 MG/ML IJ SOLN: 1.0000 mg | INTRAMUSCULAR | Status: DC | PRN

## 2019-08-03 MED ORDER — ALUM & MAG HYDROXIDE-SIMETH 200-200-20 MG/5ML PO SUSP
30.0000 mL | ORAL | Status: DC | PRN
  Administered 2019-08-03 – 2019-08-04 (×2): 30 mL via ORAL
  Filled 2019-08-03 (×2): qty 30

## 2019-08-03 NOTE — Progress Notes (Addendum)
Per CN tele sitter is for suicide 1:1. Will clear room. Trashed patient pants and belt as cult in ED. Pt aware and OK. Shoes, socks, coat and umbrella placed in plastic bag to be removed from room. Will send facemask, fingernail clippers, bank card and 3 pennies to security for safe keeping.  Per previous notes and MD Sheppard Coil patient is on suicide precautions. I have notified CN Sonia Baller and will remain with patient until suicide sitter is available. Will contact security for search and clear room. Pt resting with call bell within reach.  Will continue to monitor.
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
## 2019-08-03 NOTE — Progress Notes (Signed)
The chaplain visited with the patient as a result of a consult.  The patient stated that they want to die and have nothing to live for.  The chaplain listened to the patients story as the patient talked about all of the things that they have lost.  The chaplain also talked about God's role and how the patient is angry with God.  The chaplain will follow-up with the patient tomorrow evening to continue visiting with him.  Brion Aliment Chaplain Resident For questions concerning this note please contact me by pager [phone]

## 2019-08-03 NOTE — TOC Progression Note (Signed)
Transition of Care [facility]) - Progression Note    Patient Details  Name: Samuel Reyes MRN: [id number] Date of Birth: 1956/03/16  Transition of Care [facility]) CM/SW [place], [place] Phone Number: 08/03/2019, 5:04 PM  Clinical Narrative:     CSW made referral to [facility]- currently no availability- they will review and contact [place], MSW, [place] Worker   Expected Discharge Plan: Psychiatric Hospital Barriers to Discharge: SNF Pending bed offer  Expected Discharge Plan and Services Expected Discharge Plan: [facility] In-house Referral: Clinical Social Work     Living arrangements for the past 2 months: Homeless                                       Social Determinants of Health ([place]) Interventions    Readmission Risk Interventions No flowsheet data found.

## 2019-08-04 ENCOUNTER — Other Ambulatory Visit: Payer: Self-pay

## 2019-08-04 ENCOUNTER — Inpatient Hospital Stay (HOSPITAL_COMMUNITY)
Admission: AD | Admit: 2019-08-04 | Discharge: 2019-08-08 | DRG: 897 | Disposition: A | Discharge status: Home / Self Care | Payer: Medicaid Other | Source: Intra-hospital | Attending: Psychiatry | Admitting: Psychiatry

## 2019-08-04 ENCOUNTER — Encounter (HOSPITAL_COMMUNITY): Payer: Self-pay | Admitting: Psychiatry

## 2019-08-04 DIAGNOSIS — Z880 Allergy status to penicillin: Secondary | ICD-10-CM

## 2019-08-04 DIAGNOSIS — Z888 Allergy status to other drugs, medicaments and biological substances status: Secondary | ICD-10-CM

## 2019-08-04 DIAGNOSIS — K219 Gastro-esophageal reflux disease without esophagitis: Secondary | ICD-10-CM | POA: Diagnosis present

## 2019-08-04 DIAGNOSIS — F329 Major depressive disorder, single episode, unspecified: Secondary | ICD-10-CM | POA: Diagnosis present

## 2019-08-04 DIAGNOSIS — Z8673 Personal history of transient ischemic attack (TIA), and cerebral infarction without residual deficits: Secondary | ICD-10-CM

## 2019-08-04 DIAGNOSIS — Z823 Family history of stroke: Secondary | ICD-10-CM

## 2019-08-04 DIAGNOSIS — G4700 Insomnia, unspecified: Secondary | ICD-10-CM | POA: Diagnosis present

## 2019-08-04 DIAGNOSIS — I252 Old myocardial infarction: Secondary | ICD-10-CM | POA: Diagnosis not present

## 2019-08-04 DIAGNOSIS — I2510 Atherosclerotic heart disease of native coronary artery without angina pectoris: Secondary | ICD-10-CM | POA: Diagnosis present

## 2019-08-04 DIAGNOSIS — I10 Essential (primary) hypertension: Secondary | ICD-10-CM | POA: Diagnosis present

## 2019-08-04 DIAGNOSIS — R440 Auditory hallucinations: Secondary | ICD-10-CM | POA: Diagnosis present

## 2019-08-04 DIAGNOSIS — Y908 Blood alcohol level of 240 mg/100 ml or more: Secondary | ICD-10-CM | POA: Diagnosis present

## 2019-08-04 DIAGNOSIS — F1024 Alcohol dependence with alcohol-induced mood disorder: Secondary | ICD-10-CM | POA: Diagnosis present

## 2019-08-04 DIAGNOSIS — F10239 Alcohol dependence with withdrawal, unspecified: Secondary | ICD-10-CM | POA: Diagnosis present

## 2019-08-04 DIAGNOSIS — F1094 Alcohol use, unspecified with alcohol-induced mood disorder: Secondary | ICD-10-CM | POA: Diagnosis present

## 2019-08-04 DIAGNOSIS — F411 Generalized anxiety disorder: Secondary | ICD-10-CM | POA: Diagnosis present

## 2019-08-04 DIAGNOSIS — F331 Major depressive disorder, recurrent, moderate: Secondary | ICD-10-CM | POA: Diagnosis not present

## 2019-08-04 DIAGNOSIS — R45851 Suicidal ideations: Secondary | ICD-10-CM | POA: Diagnosis present

## 2019-08-04 DIAGNOSIS — J449 Chronic obstructive pulmonary disease, unspecified: Secondary | ICD-10-CM | POA: Diagnosis present

## 2019-08-04 DIAGNOSIS — R070 Pain in throat: Secondary | ICD-10-CM

## 2019-08-04 DIAGNOSIS — F1020 Alcohol dependence, uncomplicated: Secondary | ICD-10-CM | POA: Diagnosis not present

## 2019-08-04 DIAGNOSIS — Z590 Homelessness: Secondary | ICD-10-CM | POA: Diagnosis not present

## 2019-08-04 DIAGNOSIS — Z79899 Other long term (current) drug therapy: Secondary | ICD-10-CM | POA: Diagnosis not present

## 2019-08-04 DIAGNOSIS — F17210 Nicotine dependence, cigarettes, uncomplicated: Secondary | ICD-10-CM | POA: Diagnosis present

## 2019-08-04 LAB — BASIC METABOLIC PANEL
Anion gap: 8 (ref 5–15)
BUN: 5 mg/dL — ABNORMAL LOW (ref 8–23)
CO2: 27 mmol/L (ref 22–32)
Calcium: 8.4 mg/dL — ABNORMAL LOW (ref 8.9–10.3)
Chloride: 106 mmol/L (ref 98–111)
Creatinine, Ser: 0.74 mg/dL (ref 0.61–1.24)
GFR calc Af Amer: >60 mL/min (ref >60)
GFR calc non Af Amer: >60 mL/min (ref >60)
Glucose, Bld: 103 mg/dL — ABNORMAL HIGH (ref 70–99)
Potassium: 3.9 mmol/L (ref 3.5–5.1)
Sodium: 141 mmol/L (ref 135–145)

## 2019-08-04 LAB — CBC
HCT: 34.3 % — ABNORMAL LOW (ref 39.0–52.0)
Hemoglobin: 10.9 g/dL — ABNORMAL LOW (ref 13.0–17.0)
MCH: 26.7 pg (ref 26.0–34.0)
MCHC: 31.8 g/dL (ref 30.0–36.0)
MCV: 83.9 fL (ref 80.0–100.0)
Platelets: 294 10*3/uL (ref 150–400)
RBC: 4.09 MIL/uL — ABNORMAL LOW (ref 4.22–5.81)
RDW: 16.8 % — ABNORMAL HIGH (ref 11.5–15.5)
WBC: 8.5 10*3/uL (ref 4.0–10.5)
nRBC: 0 % (ref 0.0–0.2)

## 2019-08-04 LAB — SARS CORONAVIRUS 2 (TAT 6-24 HRS): SARS Coronavirus 2: NEGATIVE

## 2019-08-04 MED ORDER — NICOTINE 21 MG/24HR TD PT24
21.0000 mg | MEDICATED_PATCH | Freq: Every day | TRANSDERMAL | Status: DC
  Administered 2019-08-04: 21 mg via TRANSDERMAL
  Filled 2019-08-04: qty 1

## 2019-08-04 MED ORDER — AMLODIPINE BESYLATE 5 MG PO TABS: 5.0000 mg | ORAL_TABLET | Freq: Every day | ORAL | Status: DC

## 2019-08-04 MED ORDER — TRAZODONE HCL 50 MG PO TABS
50.0000 mg | ORAL_TABLET | Freq: Every evening | ORAL | Status: DC | PRN
  Administered 2019-08-04 – 2019-08-06 (×3): 50 mg via ORAL
  Filled 2019-08-04 (×3): qty 1

## 2019-08-04 MED ORDER — GABAPENTIN 300 MG PO CAPS
300.0000 mg | ORAL_CAPSULE | Freq: Three times a day (TID) | ORAL | Status: DC
  Administered 2019-08-05 – 2019-08-08 (×11): 300 mg via ORAL
  Filled 2019-08-04 (×16): qty 1

## 2019-08-04 MED ORDER — AMLODIPINE BESYLATE 5 MG PO TABS
5.0000 mg | ORAL_TABLET | Freq: Every day | ORAL | Status: DC
  Administered 2019-08-05 – 2019-08-08 (×4): 5 mg via ORAL
  Filled 2019-08-04 (×6): qty 1

## 2019-08-04 MED ORDER — GUAIFENESIN-DM 100-10 MG/5ML PO SYRP: 5.0000 mL | ORAL_SOLUTION | ORAL | Status: DC | PRN

## 2019-08-04 MED ORDER — ALUM & MAG HYDROXIDE-SIMETH 200-200-20 MG/5ML PO SUSP
30.0000 mL | ORAL | Status: DC | PRN
  Administered 2019-08-04: 30 mL via ORAL

## 2019-08-04 MED ORDER — FLUTICASONE FUROATE-VILANTEROL 200-25 MCG/INH IN AEPB
1.0000 | INHALATION_SPRAY | Freq: Every day | RESPIRATORY_TRACT | Status: DC
  Administered 2019-08-05 – 2019-08-08 (×4): 1 via RESPIRATORY_TRACT
  Filled 2019-08-04: qty 28

## 2019-08-04 MED ORDER — NICOTINE 21 MG/24HR TD PT24: 21.0000 mg | MEDICATED_PATCH | Freq: Every day | TRANSDERMAL | 0 refills | Status: DC

## 2019-08-04 MED ORDER — AMLODIPINE BESYLATE 5 MG PO TABS
5.0000 mg | ORAL_TABLET | Freq: Every day | ORAL | Status: DC
  Administered 2019-08-04: 5 mg via ORAL
  Filled 2019-08-04: qty 1

## 2019-08-04 MED ORDER — NICOTINE 21 MG/24HR TD PT24
21.0000 mg | MEDICATED_PATCH | Freq: Every day | TRANSDERMAL | Status: DC
  Filled 2019-08-04: qty 1

## 2019-08-04 MED ORDER — PHENOL 1.4 % MT LIQD: 1.0000 | OROMUCOSAL | Status: DC | PRN

## 2019-08-04 MED ORDER — THIAMINE HCL 100 MG PO TABS: 100.0000 mg | ORAL_TABLET | Freq: Every day | ORAL | Status: DC

## 2019-08-04 MED ORDER — SERTRALINE HCL 25 MG PO TABS
25.0000 mg | ORAL_TABLET | Freq: Every day | ORAL | Status: DC
  Administered 2019-08-05: 25 mg via ORAL
  Filled 2019-08-04 (×3): qty 1

## 2019-08-04 MED ORDER — GUAIFENESIN-DM 100-10 MG/5ML PO SYRP
5.0000 mL | ORAL_SOLUTION | ORAL | Status: DC | PRN
  Administered 2019-08-04: 06:00:00 5 mL via ORAL
  Filled 2019-08-04: qty 5

## 2019-08-04 MED ORDER — GUAIFENESIN-DM 100-10 MG/5ML PO SYRP: 5.0000 mL | ORAL_SOLUTION | ORAL | 0 refills | Status: DC | PRN

## 2019-08-04 MED ORDER — THIAMINE HCL 100 MG PO TABS
100.0000 mg | ORAL_TABLET | Freq: Every day | ORAL | Status: DC
  Administered 2019-08-05: 08:00:00 100 mg via ORAL
  Filled 2019-08-04 (×3): qty 1

## 2019-08-04 MED ORDER — ADULT MULTIVITAMIN W/MINERALS CH
1.0000 | ORAL_TABLET | Freq: Every day | ORAL | Status: DC
  Administered 2019-08-05: 1 via ORAL
  Filled 2019-08-04 (×3): qty 1

## 2019-08-04 MED ORDER — KETOROLAC TROMETHAMINE 15 MG/ML IJ SOLN
30.0000 mg | Freq: Once | INTRAMUSCULAR | Status: AC
  Administered 2019-08-04: 30 mg via INTRAVENOUS
  Filled 2019-08-04: qty 2

## 2019-08-04 MED ORDER — PANTOPRAZOLE SODIUM 40 MG PO TBEC: 40.0000 mg | DELAYED_RELEASE_TABLET | Freq: Two times a day (BID) | ORAL | Status: DC

## 2019-08-04 MED ORDER — ACETAMINOPHEN 325 MG PO TABS
650.0000 mg | ORAL_TABLET | Freq: Four times a day (QID) | ORAL | Status: DC | PRN
  Administered 2019-08-05 – 2019-08-08 (×4): 650 mg via ORAL
  Filled 2019-08-04 (×4): qty 2

## 2019-08-04 MED ORDER — FOLIC ACID 1 MG PO TABS
1.0000 mg | ORAL_TABLET | Freq: Every day | ORAL | Status: DC
  Administered 2019-08-05 – 2019-08-08 (×4): 1 mg via ORAL
  Filled 2019-08-04 (×6): qty 1

## 2019-08-04 MED ORDER — MAGNESIUM HYDROXIDE 400 MG/5ML PO SUSP: 30.0000 mL | Freq: Every day | ORAL | Status: DC | PRN

## 2019-08-04 MED ORDER — ENOXAPARIN SODIUM 40 MG/0.4ML ~~LOC~~ SOLN
40.0000 mg | SUBCUTANEOUS | Status: DC
  Administered 2019-08-05: 40 mg via SUBCUTANEOUS
  Filled 2019-08-04 (×4): qty 0.4

## 2019-08-04 MED ORDER — FOLIC ACID 1 MG PO TABS: 1.0000 mg | ORAL_TABLET | Freq: Every day | ORAL | Status: DC

## 2019-08-04 MED ORDER — PANTOPRAZOLE SODIUM 40 MG PO TBEC: 80.0000 mg | DELAYED_RELEASE_TABLET | Freq: Every day | ORAL | Status: DC

## 2019-08-04 MED ORDER — ALBUTEROL SULFATE (2.5 MG/3ML) 0.083% IN NEBU: 3.0000 mL | INHALATION_SOLUTION | RESPIRATORY_TRACT | Status: DC | PRN
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
## 2019-08-04 NOTE — Progress Notes (Signed)
Pt accepted to [facility]; bed 303-1  Mordecai Maes, NP is the accepting provider.    Dr. Parke Poisson is the attending provider.    Call report to [place] @ [facility] notified.     Pt is voluntary and will be transported by [facility]  Pt may arrive after receiving a new Covid test that results as negative, last test was 3 days ago.   Audree Camel, LCSW, [place] Disposition [facility]/TTS [phone]
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
## 2019-08-04 NOTE — Progress Notes (Signed)
Patient to be transported to [facility], patient IV and tele were dcd patient belongings and papers sent with patient. Transported to exit via wheel chair and nursing staff. Nelda Bucks, Bettina Gavia RN

## 2019-08-04 NOTE — TOC Progression Note (Signed)
Transition of Care [facility]) - Progression Note    Patient Details  Name: Samuel Reyes MRN: [id number] Date of Birth: 1956/06/30  Transition of Care [facility]) CM/SW [place], [place] Phone Number: 08/04/2019, 5:42 PM  Clinical Narrative:     Pt accepted to  [name]; bed 303-1  Mordecai Maes, NP is the accepting provider.    Dr. Parke Poisson is the attending provider.    Call report to [phone]     Pt is voluntary and will be transported by [facility]- transportation has been arranged. They will contact RN once they arrive.   Clinical Social Worker signing off. Thurmond Butts, MSW, [place] Clinical Social Worker    Expected Discharge Plan: Psychiatric Hospital Barriers to Discharge: SNF Pending bed offer  Expected Discharge Plan and Services Expected Discharge Plan: [facility] In-house Referral: Clinical Social Work     Living arrangements for the past 2 months: Homeless Expected Discharge Date: 08/04/19                                     Social Determinants of Health (SDOH) Interventions    Readmission Risk Interventions No flowsheet data found.

## 2019-08-04 NOTE — Progress Notes (Signed)
The chaplain visited again to support the patient.  The patient is optimistic about going to get help, but still worried about their living situation.  The chaplain does not need to follow-up.  Brion Aliment Chaplain Resident For questions concerning this note please contact me by pager [phone]

## 2019-08-04 NOTE — Tx Team (Signed)
Initial Treatment Plan 08/04/2019 11:38 PM Samuel Reyes [id number]    PATIENT STRESSORS: Loss of family members Marital or family conflict Substance abuse   PATIENT STRENGTHS: General fund of knowledge Motivation for treatment/growth   PATIENT IDENTIFIED PROBLEMS: Risk for suicide  Depression  "work on my coconut... mind, place to live, LT care"                 DISCHARGE CRITERIA:  Adequate post-discharge living arrangements Improved stabilization in mood, thinking, and/or behavior Verbal commitment to aftercare and medication compliance  PRELIMINARY DISCHARGE PLAN: Attend PHP/IOP Attend 12-step recovery group Outpatient therapy  PATIENT/FAMILY INVOLVEMENT: This treatment plan has been presented to and reviewed with the patient, [name].  The patient and family have been given the opportunity to ask questions and make suggestions.  Providence Crosby, RN 08/04/2019, 11:38 PM
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
## 2019-08-05 DIAGNOSIS — F1020 Alcohol dependence, uncomplicated: Secondary | ICD-10-CM

## 2019-08-05 LAB — HEMOGLOBIN A1C
Hgb A1c MFr Bld: 5.4 % (ref 4.8–5.6)
Mean Plasma Glucose: 108.28 mg/dL

## 2019-08-05 LAB — LIPID PANEL
Cholesterol: 161 mg/dL (ref 0–200)
HDL: 66 mg/dL (ref >40)
LDL Cholesterol: 79 mg/dL (ref 0–99)
Total CHOL/HDL Ratio: 2.4 RATIO
Triglycerides: 78 mg/dL (ref <150)
VLDL: 16 mg/dL (ref 0–40)

## 2019-08-05 LAB — TSH: TSH: 2.075 u[IU]/mL (ref 0.350–4.500)

## 2019-08-05 MED ORDER — PANTOPRAZOLE SODIUM 40 MG PO TBEC
40.0000 mg | DELAYED_RELEASE_TABLET | Freq: Every day | ORAL | Status: DC
  Administered 2019-08-06 – 2019-08-08 (×3): 40 mg via ORAL
  Filled 2019-08-05 (×5): qty 1

## 2019-08-05 MED ORDER — NICOTINE 21 MG/24HR TD PT24
21.0000 mg | MEDICATED_PATCH | Freq: Every day | TRANSDERMAL | Status: DC
  Administered 2019-08-05 – 2019-08-08 (×4): 21 mg via TRANSDERMAL
  Filled 2019-08-05 (×7): qty 1

## 2019-08-05 MED ORDER — ADULT MULTIVITAMIN W/MINERALS CH
1.0000 | ORAL_TABLET | Freq: Every day | ORAL | Status: DC
  Administered 2019-08-05 – 2019-08-08 (×4): 1 via ORAL
  Filled 2019-08-05 (×6): qty 1

## 2019-08-05 MED ORDER — PANTOPRAZOLE SODIUM 40 MG PO TBEC
40.0000 mg | DELAYED_RELEASE_TABLET | Freq: Once | ORAL | Status: AC
  Administered 2019-08-05: 40 mg via ORAL
  Filled 2019-08-05: qty 1

## 2019-08-05 MED ORDER — SERTRALINE HCL 50 MG PO TABS
50.0000 mg | ORAL_TABLET | Freq: Every day | ORAL | Status: DC
  Administered 2019-08-06 – 2019-08-08 (×3): 50 mg via ORAL
  Filled 2019-08-05 (×5): qty 1

## 2019-08-05 MED ORDER — LORAZEPAM 1 MG PO TABS: 1.0000 mg | ORAL_TABLET | Freq: Four times a day (QID) | ORAL | Status: AC | PRN

## 2019-08-05 MED ORDER — THIAMINE HCL 100 MG PO TABS
100.0000 mg | ORAL_TABLET | Freq: Every day | ORAL | Status: DC
  Administered 2019-08-06 – 2019-08-08 (×3): 100 mg via ORAL
  Filled 2019-08-05 (×5): qty 1
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
## 2019-08-05 NOTE — BHH Group Notes (Signed)
LCSW Group Therapy Note 08/05/2019 2:31 PM  Type of Therapy and Topic: Group Therapy: Overcoming Obstacles  Participation Level: Active  Description of Group:  In this group patients will be encouraged to explore what they see as obstacles to their own wellness and recovery. They will be guided to discuss their thoughts, feelings, and behaviors related to these obstacles. The group will process together ways to cope with barriers, with attention given to specific choices patients can make. Each patient will be challenged to identify changes they are motivated to make in order to overcome their obstacles. This group will be process-oriented, with patients participating in exploration of their own experiences as well as giving and receiving support and challenge from other group members.  Therapeutic Goals: 1. Patient will identify personal and current obstacles as they relate to admission. 2. Patient will identify barriers that currently interfere with their wellness or overcoming obstacles.  3. Patient will identify feelings, thought process and behaviors related to these barriers. 4. Patient will identify two changes they are willing to make to overcome these obstacles:   Summary of Patient Progress  Samuel Reyes was engaged and participated throughout the group session. Samuel Reyes reports that he needs helps finding doctors for his multiple medical issues.    Therapeutic Modalities:  Cognitive Behavioral Therapy Solution Focused Therapy Motivational Interviewing Relapse Prevention Therapy   Theresa Duty Clinical Social Worker

## 2019-08-05 NOTE — Progress Notes (Signed)
Recreation Therapy Notes  Date:  1.6.21 Time: 0930 Location: [address] Dayroom  Group Topic: Stress Management  Goal Area(s) Addresses:  Patient will identify positive stress management techniques. Patient will identify benefits of using stress management post d/c.  Behavioral Response:  Engaged  Intervention: Stress Management  Activity :  Guided Imagery.  LRT read a script that encouraged patients to enjoy the sounds of waves while relaxing on the beach. Patients were to listen and follow along as meditation.  Education:  Stress Management, Discharge Planning.   Education Outcome: Acknowledges Education  Clinical Observations/Feedback: Pt attended and participated in activity.    [name], LRT/CTRS   Ria Comment, Martiza Speth A 08/05/2019 11:00 AM
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
## 2019-08-05 NOTE — Plan of Care (Signed)
Nurse discussed anxiety, depression and coping skills with patient.  

## 2019-08-05 NOTE — Progress Notes (Signed)
D:  Patient's self inventory sheet, patient has fair sleep, sleep medication not helpful.  Poor appetite, low energy level, poor concentration.  Rated depression, hopeless and anxiety 8.  Denied withdrawals.  SI, contracts for safety, no plan.  Physical problems, pain, dizziness, headaches, blurred vision.  Physical pain, neck, knee, #8 pain.  Goal is better thinking.  Think of something else.  No discharge plans. A:  Medications administered per MD orders.  Emotional support and encouragement given patient. R:  Denied HI.  Denied A/V hallucinations.  SI, contracts for safety.  Safety maintained with 15 minute checks.  Patient has been sitting in dayroom, talking to peers, watching tv.  No signs/symptoms of pain/distress noted on patient's face/body movements.  Patient walks and moves without any signs of pain. Marland Kitchen

## 2019-08-05 NOTE — Progress Notes (Signed)
   08/05/19 2322  Psych Admission Type (Psych Patients Only)  Admission Status Voluntary  Psychosocial Assessment  Patient Complaints Anxiety  Eye Contact Fair  Facial Expression Sad  Affect Depressed  Speech Logical/coherent  Interaction Assertive  Motor Activity Slow  Appearance/Hygiene Disheveled;Body odor  Behavior Characteristics Cooperative  Mood Anxious  Aggressive Behavior  Effect No apparent injury  Thought Process  Coherency WDL  Content WDL  Delusions None reported or observed  Perception WDL  Hallucination None reported or observed  Judgment WDL  Confusion None  Danger to Self  Current suicidal ideation? Denies  Self-Injurious Behavior No self-injurious ideation or behavior indicators observed or expressed   Agreement Not to Harm Self Yes  Description of Agreement verbal contract  Danger to Others  Danger to Others None reported or observed  D: Patient in dayroom interacting with peers reports he had a good day.  A: Medications administered as prescribed. Support and encouragement provided as needed.  R: Patient remains safe on the unit. Will continue to monitor for safety and stability.

## 2019-08-05 NOTE — H&P (Addendum)
Psychiatric Admission Assessment Adult  Patient Identification: Samuel Reyes  MRN:  [id number]  Date of Evaluation:  08/05/2019  Chief Complaint:  MDD (major depressive disorder) [F32.9]  Principal Diagnosis: Alcohol use disorder, severe, dependence ([place])  Diagnosis:  Principal Problem:   Alcohol use disorder, severe, dependence ([place]) Active Problems:   Alcohol use with alcohol-induced mood disorder ([place])   MDD (major depressive disorder)  History of Present Illness:This is the [facility] for this 64 year old Caucasian male with hx of chronic alcoholism. He is known in this psychiatric hospital for substance use disorder from his previous admission & treatment in this [facility]. Chart review indicated that he was a patient here at [facility] from 03-31-19 through 04-06-19. He was discharged with medications & to follow-up care at the [facility] here in [place], [place]. He is being admitted to the [facility] this time around from the [facility] medical floor. Patient was apparent found passed out on the street with empty beer bottles around him. He was found medically comprised. After medical stabilization, he was sent to [facility] for mental health evaluation & treatments. His BAL was 274, a low Hgb of 10.9 & HCT of 34.4.   During this evaluation, Samuel Reyes reports, "I was at the medical floor of the [facility] for about a week. I was found some where here in [place], on the streets cold & out of it. I don't remember passing out or what led to it. I just woke up at the hospital. I believe I was drinking a lot of alcohol prior to passing out. I remembered feeling like I did not want to live any more. I was remembering all that I had lost - my 2 sons, then my wife of crohn's disease 3 years ago. I got to drinking to cope with my loss. I'm an alcoholic. I started drinking heavily about 42 years ago after I lost my first son. I had a substance abuse treatment once  in [place]. Then, my second son died too, later my wife of Crohn's disease 3 years ago. I decided to move to [place] area 2.5 years ago because I was born here. But, when I got here, my mother is too old to remember me. I couldn't relate to my sisters because I was like a stranger to them. We had grown apart all those years I was in [place]. I have been drinking a lot. I realized now that my health is quitting on me too. I got a lot of health problems now. I'm still feeling suicidal. It comes & goes, but I feel safe here. I do hear voices from time to time. The last time was 3 days ago while at [facility]. My depression is at #7 today & anxiety #8. I don't sleep at night. No withdrawal symptoms".  Associated Signs/Symptoms:  Depression Symptoms:  depressed mood, insomnia, feelings of worthlessness/guilt, hopelessness, recurrent thoughts of death, suicidal thoughts without plan, anxiety,  (Hypo) Manic Symptoms:  Impulsivity, Labiality of Mood,  Anxiety Symptoms:  Excessive Worry,  Psychotic Symptoms:  "I hear voices at times. The last time that I heard voices was 3 days ago while at [facility]"  PTSD Symptoms: "I buried my 2 sons, then my wife 3 years ago. I can't get over their deaths. My heart is broken". Re-experiencing:  Flashbacks Intrusive Thoughts  Total Time spent with patient: 1 hour  Past Psychiatric History: Alcoholism, alcohol induced mood disorder.  Is the patient at risk to self? Yes.    Has the patient been a risk to self in the past 6 months? Yes.    Has the patient been a risk to self within the distant past? Yes.    Is the patient a risk to others? No.  Has the patient been a risk to others in the past 6 months? No.  Has the patient been a risk to others within the distant past? No.   Prior Inpatient Therapy: Yes, (Center previously, Psychiatric hospital in [place]). Prior Outpatient Therapy: Yes ([facility])   Alcohol Screening: 1. How often do you  have a drink containing alcohol?: 4 or more times a week 2. How many drinks containing alcohol do you have on a typical day when you are drinking?: 3 or 4 3. How often do you have six or more drinks on one occasion?: Monthly AUDIT-C Score: 7 4. How often during the last year have you found that you were not able to stop drinking once you had started?: Weekly 5. How often during the last year have you failed to do what was normally expected from you becasue of drinking?: Less than monthly 6. How often during the last year have you needed a first drink in the morning to get yourself going after a heavy drinking session?: Daily or almost daily 7. How often during the last year have you had a feeling of guilt of remorse after drinking?: Daily or almost daily 8. How often during the last year have you been unable to remember what happened the night before because you had been drinking?: Less than monthly 9. Have you or someone else been injured as a result of your drinking?: No 10. Has a relative or friend or a doctor or another health worker been concerned about your drinking or suggested you cut down?: Yes, during the last year Alcohol Use Disorder Identification Test Final Score (AUDIT): 24  Substance Abuse History in the last 12 months: Yes.    Consequences of Substance Abuse: Medical Consequences:  Liver damage, Possible death by overdose Legal Consequences:  Arrests, jail time, Loss of driving privilege. Family Consequences:  Family discord, divorce and or separation.  Previous Psychotropic Medications: Yes (Sertraline)  Psychological Evaluations: No   Past Medical History:  Past Medical History:  Diagnosis Date  . Alcoholism ([place])   . Barrett's esophagus   . CAD (coronary artery disease) 2002   MI, no intervention required  . COPD (chronic obstructive pulmonary disease) (HCC)    not on home O2  . Depression   . DTs (delirium tremens) ([place])   . GERD (gastroesophageal reflux  disease)   . Headache   . Homeless   . Hypertension   . Stroke [facility]) 2006    Past Surgical History:  Procedure Laterality Date  . BACK SURGERY     3 cervical spine surgeries C4-C5 fused  . COLONOSCOPY N/A 01/04/2014   Procedure: COLONOSCOPY;  Surgeon: Danie Binder, MD;  Location: [facility] ENDO SUITE;  Service: Endoscopy;  Laterality: N/A;  1:45  . ESOPHAGOGASTRODUODENOSCOPY N/A 01/04/2014   Procedure: ESOPHAGOGASTRODUODENOSCOPY (EGD);  Surgeon: Danie Binder, MD;  Location: [facility] ENDO SUITE;  Service: Endoscopy;  Laterality: N/A;  . FINGER SURGERY Left    2nd, 3rd, & 4th fingers were cut off by table saw and reattached  . GASTRECTOMY    . HERNIA REPAIR    . INCISIONAL HERNIA REPAIR N/A 01/20/2014   Procedure: LAPAROSCOPIC RECURRENT  INCISIONAL HERNIA with  mesh;  Surgeon: Edward Jolly, MD;  Location: [facility] ORS;  Service: General;  Laterality: N/A;  . rt knee arthroscopic surgery    . SHOULDER SURGERY Bilateral    3 surgeries on on left, 2 surgeries on right    Family History:  Family History  Problem Relation Age of Onset  . Cancer Father        bone  . Cancer Brother        lungs  . Stroke Maternal Grandmother   . Asthma Son        died at age 28 in his sleep   . Spina bifida Son        died at age 63   . Dementia Mother   . Colon cancer Neg Hx    Family Psychiatric  History: Alcoholism: "My ather & my 2 sisters. One of my sister drinks like a fish"  Tobacco Screening: Have you used any form of tobacco in the last 30 days? (Cigarettes, Smokeless Tobacco, Cigars, and/or Pipes): Yes Tobacco use, Select all that apply: 5 or more cigarettes per day Are you interested in Tobacco Cessation Medications?: Yes, will notify MD for an order Counseled patient on smoking cessation including recognizing danger situations, developing coping skills and basic information about quitting provided: Refused/Declined practical counseling  Social History: Widowed, homeless in [place], [place], disabled,  collects SSI, has one living daughter. Social History   Substance and Sexual Activity  Alcohol Use Yes  . Alcohol/week: 15.0 standard drinks  . Types: 15 Cans of beer per week   Comment: last drink 2 days ago     Social History   Substance and Sexual Activity  Drug Use No   Comment: denied using any drugs    Additional Social History: Allergies:   Allergies  Allergen Reactions  . Bee Venom Anaphylaxis  . Penicillins Rash    Has patient had a PCN reaction causing immediate rash, facial/tongue/throat swelling, SOB or lightheadedness with hypotension: {Yes Has patient had a PCN reaction causing severe rash involving mucus membranes or skin necrosis: YES Has patient had a PCN reaction that required hospitalization Yes Has patient had a PCN reaction occurring within the last 10 years: YES If all of the above answers are "NO", then may proceed with Cephalosporin use.   . Vancomycin Tinitus    Pt states he "almost went deaf" - does not want to take vanc   Lab Results:  Results for orders placed or performed during the hospital encounter of 08/04/19 (from the past 48 hour(s))  Lipid panel     Status: None   Collection Time: 08/05/19  6:20 AM  Result Value Ref Range   Cholesterol 161 0 - 200 mg/dL   Triglycerides 78 <150 mg/dL   HDL 66 >40 mg/dL   Total CHOL/HDL Ratio 2.4 RATIO   VLDL 16 0 - 40 mg/dL   LDL Cholesterol 79 0 - 99 mg/dL    Comment:        Total Cholesterol/HDL:CHD Risk Coronary Heart Disease Risk Table                     Men   Women  1/2 Average Risk   3.4   3.3  Average Risk       5.0   4.4  2 X Average Risk   9.6   7.1  3 X Average Risk  23.4   11.0        Use the calculated Patient  Ratio above and the CHD Risk Table to determine the patient's CHD Risk.        ATP III CLASSIFICATION (LDL):  <100     mg/dL   Optimal  100-129  mg/dL   Near or Above                    Optimal  130-159  mg/dL   Borderline  160-189  mg/dL   High  >190     mg/dL   Very  High Performed at [facility] [address] [place], [place] [address]   TSH     Status: None   Collection Time: 08/05/19  6:20 AM  Result Value Ref Range   TSH 2.075 0.350 - 4.500 uIU/mL    Comment: Performed by a 3rd Generation assay with a functional sensitivity of <=0.01 uIU/mL. Performed at [facility], [place] [address] [place], [place] [address]    Blood Alcohol level:  Lab Results  Component Value Date   ETH 274 (H) 08/01/2019   ETH 119 (H) 46/80/3212   Metabolic Disorder Labs:  Lab Results  Component Value Date   HGBA1C 5.5 04/01/2019   MPG 111.15 04/01/2019   MPG 108 06/26/2017   No results found for: PROLACTIN Lab Results  Component Value Date   CHOL 161 08/05/2019   TRIG 78 08/05/2019   HDL 66 08/05/2019   CHOLHDL 2.4 08/05/2019   VLDL 16 08/05/2019   LDLCALC 79 08/05/2019   LDLCALC 134 (H) 04/01/2019   Current Medications: Current Facility-Administered Medications  Medication Dose Route Frequency Provider Last Rate Last Admin  . acetaminophen (TYLENOL) tablet 650 mg  650 mg Oral Q6H PRN Mordecai Maes, NP   650 mg at 08/05/19 0820  . albuterol (PROVENTIL) (2.5 MG/3ML) 0.083% nebulizer solution 3 mL  3 mL Inhalation Q4H PRN Mordecai Maes, NP      . alum & mag hydroxide-simeth (MAALOX/MYLANTA) 200-200-20 MG/5ML suspension 30 mL  30 mL Oral Q4H PRN Mordecai Maes, NP   30 mL at 08/04/19 2135  . amLODipine (NORVASC) tablet 5 mg  5 mg Oral Daily Mordecai Maes, NP   5 mg at 08/05/19 0814  . enoxaparin (LOVENOX) injection 40 mg  40 mg Subcutaneous Q24H Mordecai Maes, NP      . fluticasone furoate-vilanterol (BREO ELLIPTA) 200-25 MCG/INH 1 puff  1 puff Inhalation Daily Mordecai Maes, NP   1 puff at 08/05/19 0815  . folic acid (FOLVITE) tablet 1 mg  1 mg Oral Daily Mordecai Maes, NP   1 mg at 08/05/19 0816  . gabapentin (NEURONTIN) capsule 300 mg  300 mg Oral TID Mordecai Maes, NP   300 mg at 08/05/19 0816   . guaiFENesin-dextromethorphan (ROBITUSSIN DM) 100-10 MG/5ML syrup 5 mL  5 mL Oral Q4H PRN Mordecai Maes, NP      . magnesium hydroxide (MILK OF MAGNESIA) suspension 30 mL  30 mL Oral Daily PRN Mordecai Maes, NP      . multivitamin with minerals tablet 1 tablet  1 tablet Oral Daily Mordecai Maes, NP   1 tablet at 08/05/19 0816  . nicotine (NICODERM CQ - dosed in mg/24 hours) patch 21 mg  21 mg Transdermal Daily Anike, Adaku C, NP   21 mg at 08/05/19 0818  . sertraline (ZOLOFT) tablet 25 mg  25 mg Oral Daily Mordecai Maes, NP   25 mg at 08/05/19 0816  . thiamine tablet 100 mg  100 mg Oral Daily Mordecai Maes,  NP   100 mg at 08/05/19 0816  . traZODone (DESYREL) tablet 50 mg  50 mg Oral QHS PRN Mordecai Maes, NP   50 mg at 08/04/19 2135   PTA Medications: Medications Prior to Admission  Medication Sig Dispense Refill Last Dose  . albuterol (VENTOLIN HFA) 108 (90 Base) MCG/ACT inhaler Inhale 2 puffs into the lungs every 4 (four) hours as needed for wheezing or shortness of breath. (Patient not taking: Reported on 08/02/2019) 18 g 1   . amLODipine (NORVASC) 5 MG tablet Take 1 tablet (5 mg total) by mouth daily.     . budesonide-formoterol (SYMBICORT) 160-4.5 MCG/ACT inhaler Inhale 2 puffs into the lungs 2 (two) times daily. (Patient not taking: Reported on 08/02/2019) 1 Inhaler 3   . folic acid (FOLVITE) 1 MG tablet Take 1 tablet (1 mg total) by mouth daily.     [place] gabapentin (NEURONTIN) 300 MG capsule Take 1 capsule (300 mg total) by mouth 3 (three) times daily. (Patient not taking: Reported on 08/02/2019) 90 capsule 0   . guaiFENesin-dextromethorphan (ROBITUSSIN DM) 100-10 MG/5ML syrup Take 5 mLs by mouth every 4 (four) hours as needed for cough (chest congestion). 118 mL 0   . hydrOXYzine (ATARAX/VISTARIL) 25 MG tablet Take 1 tablet (25 mg total) by mouth 3 (three) times daily as needed for anxiety. (Patient not taking: Reported on 08/02/2019) 30 tablet 1   . Multiple Vitamin (MULTIVITAMIN  WITH MINERALS) TABS tablet Take 1 tablet by mouth daily. (Patient not taking: Reported on 06/26/2019) 30 tablet 0   . nicotine (NICODERM CQ - DOSED IN MG/24 HOURS) 21 mg/24hr patch Place 1 patch (21 mg total) onto the skin daily. 28 patch 0   . pantoprazole (PROTONIX) 40 MG tablet Take 1 tablet (40 mg total) by mouth 2 (two) times daily.     . sertraline (ZOLOFT) 25 MG tablet Take 1 tablet (25 mg total) by mouth daily. (Patient not taking: Reported on 08/02/2019) 30 tablet 1   . thiamine 100 MG tablet Take 1 tablet (100 mg total) by mouth daily.     . traZODone (DESYREL) 50 MG tablet Take 1 tablet (50 mg total) by mouth at bedtime as needed for sleep. (Patient not taking: Reported on 08/02/2019) 30 tablet 1    Musculoskeletal: Strength & Muscle Tone: within normal limits Gait & Station: normal Patient leans: N/A  Psychiatric Specialty Exam: Physical Exam  Nursing note and vitals reviewed. Constitutional: He is oriented to person, place, and time. He appears well-developed.  Cardiovascular:  Elevated pulse rate  Respiratory: Effort normal.  Genitourinary:    Genitourinary Comments: Deferred   Musculoskeletal:        General: Normal range of motion.     Cervical back: Normal range of motion.  Neurological: He is alert and oriented to person, place, and time.  Skin: Skin is warm and dry.    Review of Systems  Constitutional: Negative for chills, diaphoresis and fever.  HENT: Negative for congestion, sneezing and tinnitus.   Respiratory: Negative for cough, shortness of breath and wheezing.   Cardiovascular: Negative for chest pain and palpitations.  Gastrointestinal: Negative for diarrhea, nausea and vomiting.  Genitourinary: Negative for difficulty urinating.  Musculoskeletal: Positive for arthralgias and myalgias.  Skin: Negative for color change.  Neurological: Positive for tremors. Negative for dizziness and headaches.  Psychiatric/Behavioral: Positive for dysphoric mood,  hallucinations (Hx. AH, heard voices last 3 days ago.), sleep disturbance and suicidal ideas ( Denies any palns or intent. Able to  contratct for safety). Negative for agitation, behavioral problems, confusion, decreased concentration and self-injury. The patient is nervous/anxious. The patient is not hyperactive.     Blood pressure (!) 147/83, pulse (!) 102, temperature 98 F (36.7 C), resp. rate 20, height 5' 3"  (1.6 m), weight 65.3 kg, SpO2 94 %.Body mass index is 25.51 kg/m.  General Appearance: Disheveled  Eye Contact:  Good  Speech:  Clear and Coherent and Normal Rate  Volume:  Normal  Mood:  Anxious, Depressed and Hopeless  Affect:  Depressed and Flat  Thought Process:  Coherent, Goal Directed and Descriptions of Associations: Intact  Orientation:  Full (Time, Place, and Person)  Thought Content:  Rumination, reports hx of auditory hallucinations. Denies any lusions or paranoia.  Suicidal Thoughts:  Yes.  without intent/plan, able to contract for safety verbally.  Homicidal Thoughts:  Denies  Memory:  Immediate;   Good Recent;   Good Remote;   Good  Judgement:  Intact  Insight:  Present  Psychomotor Activity:  Reports some tremors & increased anxiety.  Concentration:  Concentration: Good and Attention Span: Good  Recall:  Good  Fund of Knowledge:  Good  Language:  Good  Akathisia:  No  Handed:  Right  AIMS (if indicated):     Assets:  Communication Skills Desire for Improvement Financial Resources/Insurance Resilience  ADL's:  Intact  Cognition:  WNL  Sleep:  Number of Hours: 5   Treatment Plan Summary: Daily contact with patient to assess and evaluate symptoms and progress in treatment and Medication management.  Treatment Plan/Recommendations:  1. Admit for crisis management and stabilization, estimated length of stay 3-5 days.    2. Medication management to reduce current symptoms to base line and improve the patient's overall level of functioning: See MAR, Md's  SRA & treatment plan.   Observation Level/Precautions:  15 minute checks  Laboratory:  Per ED, BA: 284, low Hgb: 10.9, Low HCT: 34.3  Psychotherapy: Group sessions  Medications: See MAR  Consultations: As needed   Discharge Concerns: Safety, mood stability, maintaining sobriety.  Estimated LOS: 2-4 days  Other: Admit to the [facility].   Physician Treatment Plan for Primary Diagnosis: Alcohol use disorder, severe, dependence ([place])  Long Term Goal(s): Improvement in symptoms so as ready for discharge  Short Term Goals: Ability to identify changes in lifestyle to reduce recurrence of condition will improve, Ability to verbalize feelings will improve and Ability to demonstrate self-control will improve  Physician Treatment Plan for Secondary Diagnosis: Principal Problem:   Alcohol use disorder, severe, dependence ([place]) Active Problems:   Alcohol use with alcohol-induced mood disorder ([place])   MDD (major depressive disorder)  Long Term Goal(s): Improvement in symptoms so as ready for discharge  Short Term Goals: Ability to identify and develop effective coping behaviors will improve, Compliance with prescribed medications will improve and Ability to identify triggers associated with substance abuse/mental health issues will improve  I certify that inpatient services furnished can reasonably be expected to improve the patient's condition.    Lindell Spar, NP, PMHNP, FNP-BC 1/6/20219:22 AM  I have discussed case with NP and have met with patient  Agree with NP note and assessment  64 year old male, presented to ED on 1/2 after being found outside unresponsive, hypothermic, with core body temp of 83. Was initially  admitted for medical management/ stabilization. He also reported worsening  depression and suicidal ideations, with thoughts of walking into traffic .He was seen by Psychiatry Consultant who recommended inpatient psychiatric admission upon  medical clearance. He describes significant  contributing stressors, to include homelessness, limited social support system, and reports loss of wife ( who died 3 years ago from complications of Chron's ), and son ( who passed away 8 years ago related to asthma) He also reports he had been off his psychiatric medications for several weeks. Patient is known to our unit from past admission in 03/2019 at which time presented for heavy , daily drinking , second degree sunburns, and suicidal ideations similar to above .   At the time was discharged on Neurontin and Zoloft.  History of alcohol use disorder. Reports he had been drinking up to 5  40 ounce beers per day. Denies drug abuse . Medical History- history of HTN. Allergic to PCN. * Currently not presenting with significant symptoms of WDL- no tremors, no diaphoresis, no psychomotor restlessness. 1/6 labs reviewed - Lipid Panel is unremarkable, , HgbA1C 5.4, TSH 2.07 , COVID negative 1/5  Dx- Alcohol Use Disorder, Alcohol Induced Mood Disorder versus MDD  Plan- Inpatient admission Has been restarted on Zoloft  ( currently 25 mgrs QDAY) and Neurontin ( currently at 300 mgrs TID) . Ativan PRN for alcohol WDL if needed  ( currently not presenting with symptoms of WDL) .

## 2019-08-05 NOTE — BHH Counselor (Signed)
CSW assisted patient in completing SCAT application, per patient request.  Stephanie Acre, MSW, [place] Social Worker [facility]  [phone]

## 2019-08-05 NOTE — BHH Suicide Risk Assessment (Signed)
[place] INPATIENT:  Family/Significant Other Suicide Prevention Education  Suicide Prevention Education:  Education Completed; with mother, Alona Bene [phone]) has been identified by the patient as the family member/significant other with whom the patient will be residing, and identified as the person(s) who will aid the patient in the event of a mental health crisis (suicidal ideations/suicide attempt).  With written consent from the patient, the family member/significant other has been provided the following suicide prevention education, prior to the and/or following the discharge of the patient.  The suicide prevention education provided includes the following:  Suicide risk factors  Suicide prevention and interventions  National Suicide Hotline telephone number  [facility] assessment telephone number  [facility] Emergency Assistance [place] and/or Residential Mobile Crisis Unit telephone number  Request made of family/significant other to:  Remove weapons (e.g., guns, rifles, knives), all items previously/currently identified as safety concern.    Remove drugs/medications (over-the-counter, prescriptions, illicit drugs), all items previously/currently identified as a safety concern.  The family member/significant other verbalizes understanding of the suicide prevention education information provided.  The family member/significant other agrees to remove the items of safety concern listed above.  Becky Sax reports she does not have any questions or concerns at this time. She thanked CSW for helping her son. Becky Sax shared that her son has struggled with homelessness and excessive drinking for many years. She did not requests any updates or further information at this time. CSW will continue to follow.   Marylee Floras 08/05/2019, 9:53 AM

## 2019-08-05 NOTE — Tx Team (Signed)
Interdisciplinary Treatment and Diagnostic Plan Update  08/05/2019 Time of Session: 9:00am Samuel Reyes MRN: [id number]  Principal Diagnosis: <principal problem not specified>  Secondary Diagnoses: Active Problems:   MDD (major depressive disorder)   Current Medications:  Current Facility-Administered Medications  Medication Dose Route Frequency Provider Last Rate Last Admin  . acetaminophen (TYLENOL) tablet 650 mg  650 mg Oral Q6H PRN Mordecai Maes, NP   650 mg at 08/05/19 0820  . albuterol (PROVENTIL) (2.5 MG/3ML) 0.083% nebulizer solution 3 mL  3 mL Inhalation Q4H PRN Mordecai Maes, NP      . alum & mag hydroxide-simeth (MAALOX/MYLANTA) 200-200-20 MG/5ML suspension 30 mL  30 mL Oral Q4H PRN Mordecai Maes, NP   30 mL at 08/04/19 2135  . amLODipine (NORVASC) tablet 5 mg  5 mg Oral Daily Mordecai Maes, NP   5 mg at 08/05/19 0814  . enoxaparin (LOVENOX) injection 40 mg  40 mg Subcutaneous Q24H Mordecai Maes, NP      . fluticasone furoate-vilanterol (BREO ELLIPTA) 200-25 MCG/INH 1 puff  1 puff Inhalation Daily Mordecai Maes, NP   1 puff at 08/05/19 0815  . folic acid (FOLVITE) tablet 1 mg  1 mg Oral Daily Mordecai Maes, NP   1 mg at 08/05/19 0816  . gabapentin (NEURONTIN) capsule 300 mg  300 mg Oral TID Mordecai Maes, NP   300 mg at 08/05/19 0816  . guaiFENesin-dextromethorphan (ROBITUSSIN DM) 100-10 MG/5ML syrup 5 mL  5 mL Oral Q4H PRN Mordecai Maes, NP      . magnesium hydroxide (MILK OF MAGNESIA) suspension 30 mL  30 mL Oral Daily PRN Mordecai Maes, NP      . multivitamin with minerals tablet 1 tablet  1 tablet Oral Daily Mordecai Maes, NP   1 tablet at 08/05/19 0816  . nicotine (NICODERM CQ - dosed in mg/24 hours) patch 21 mg  21 mg Transdermal Daily Anike, Adaku C, NP   21 mg at 08/05/19 0818  . sertraline (ZOLOFT) tablet 25 mg  25 mg Oral Daily Mordecai Maes, NP   25 mg at 08/05/19 0816  . thiamine tablet 100 mg  100 mg Oral Daily Mordecai Maes,  NP   100 mg at 08/05/19 0816  . traZODone (DESYREL) tablet 50 mg  50 mg Oral QHS PRN Mordecai Maes, NP   50 mg at 08/04/19 2135   PTA Medications: Medications Prior to Admission  Medication Sig Dispense Refill Last Dose  . albuterol (VENTOLIN HFA) 108 (90 Base) MCG/ACT inhaler Inhale 2 puffs into the lungs every 4 (four) hours as needed for wheezing or shortness of breath. (Patient not taking: Reported on 08/02/2019) 18 g 1   . amLODipine (NORVASC) 5 MG tablet Take 1 tablet (5 mg total) by mouth daily.     . budesonide-formoterol (SYMBICORT) 160-4.5 MCG/ACT inhaler Inhale 2 puffs into the lungs 2 (two) times daily. (Patient not taking: Reported on 08/02/2019) 1 Inhaler 3   . folic acid (FOLVITE) 1 MG tablet Take 1 tablet (1 mg total) by mouth daily.     Marland Kitchen gabapentin (NEURONTIN) 300 MG capsule Take 1 capsule (300 mg total) by mouth 3 (three) times daily. (Patient not taking: Reported on 08/02/2019) 90 capsule 0   . guaiFENesin-dextromethorphan (ROBITUSSIN DM) 100-10 MG/5ML syrup Take 5 mLs by mouth every 4 (four) hours as needed for cough (chest congestion). 118 mL 0   . hydrOXYzine (ATARAX/VISTARIL) 25 MG tablet Take 1 tablet (25 mg total) by mouth 3 (three) times daily as needed for  anxiety. (Patient not taking: Reported on 08/02/2019) 30 tablet 1   . Multiple Vitamin (MULTIVITAMIN WITH MINERALS) TABS tablet Take 1 tablet by mouth daily. (Patient not taking: Reported on 06/26/2019) 30 tablet 0   . nicotine (NICODERM CQ - DOSED IN MG/24 HOURS) 21 mg/24hr patch Place 1 patch (21 mg total) onto the skin daily. 28 patch 0   . pantoprazole (PROTONIX) 40 MG tablet Take 1 tablet (40 mg total) by mouth 2 (two) times daily.     . sertraline (ZOLOFT) 25 MG tablet Take 1 tablet (25 mg total) by mouth daily. (Patient not taking: Reported on 08/02/2019) 30 tablet 1   . thiamine 100 MG tablet Take 1 tablet (100 mg total) by mouth daily.     . traZODone (DESYREL) 50 MG tablet Take 1 tablet (50 mg total) by mouth at  bedtime as needed for sleep. (Patient not taking: Reported on 08/02/2019) 30 tablet 1     Patient Stressors: Loss of family members Marital or family conflict Substance abuse  Patient Strengths: General fund of knowledge Motivation for treatment/growth  Treatment Modalities: Medication Management, Group therapy, Case management,  1 to 1 session with clinician, Psychoeducation, Recreational therapy.   Physician Treatment Plan for Primary Diagnosis: <principal problem not specified> Long Term Goal(s):     Short Term Goals:    Medication Management: Evaluate patient's response, side effects, and tolerance of medication regimen.  Therapeutic Interventions: 1 to 1 sessions, Unit Group sessions and Medication administration.  Evaluation of Outcomes: Not Met  Physician Treatment Plan for Secondary Diagnosis: Active Problems:   MDD (major depressive disorder)  Long Term Goal(s):     Short Term Goals:       Medication Management: Evaluate patient's response, side effects, and tolerance of medication regimen.  Therapeutic Interventions: 1 to 1 sessions, Unit Group sessions and Medication administration.  Evaluation of Outcomes: Not Met   RN Treatment Plan for Primary Diagnosis: <principal problem not specified> Long Term Goal(s): Knowledge of disease and therapeutic regimen to maintain health will improve  Short Term Goals: Ability to demonstrate self-control, Ability to disclose and discuss suicidal ideas, Ability to identify and develop effective coping behaviors will improve and Compliance with prescribed medications will improve  Medication Management: RN will administer medications as ordered by provider, will assess and evaluate patient's response and provide education to patient for prescribed medication. RN will report any adverse and/or side effects to prescribing provider.  Therapeutic Interventions: 1 on 1 counseling sessions, Psychoeducation, Medication administration,  Evaluate responses to treatment, Monitor vital signs and CBGs as ordered, Perform/monitor CIWA, COWS, AIMS and Fall Risk screenings as ordered, Perform wound care treatments as ordered.  Evaluation of Outcomes: Not Met   LCSW Treatment Plan for Primary Diagnosis: <principal problem not specified> Long Term Goal(s): Safe transition to appropriate next level of care at discharge, Engage patient in therapeutic group addressing interpersonal concerns.  Short Term Goals: Engage patient in aftercare planning with referrals and resources, Increase ability to appropriately verbalize feelings, Identify triggers associated with mental health/substance abuse issues and Increase skills for wellness and recovery  Therapeutic Interventions: Assess for all discharge needs, 1 to 1 time with Social worker, Explore available resources and support systems, Assess for adequacy in community support network, Educate family and significant other(s) on suicide prevention, Complete Psychosocial Assessment, Interpersonal group therapy.  Evaluation of Outcomes: Not Met  Progress in Treatment: Attending groups: No. No to unit. Participating in groups: No. Taking medication as prescribed: Yes. Toleration medication: Yes.  Family/Significant other contact made: No, will contact:  supports if consents are granted. Patient understands diagnosis: Yes. Discussing patient identified problems/goals with staff: Yes. Medical problems stabilized or resolved: Yes. Denies suicidal/homicidal ideation: No. Issues/concerns per patient self-inventory: Yes.  New problem(s) identified: Yes, Describe:  grief, limited social supports  New Short Term/Long Term Goal(s): detox, medication management for mood stabilization; elimination of SI thoughts; development of comprehensive mental wellness/sobriety plan.  Patient Goals:  "Straighten out this coconut a little bit."  Discharge Plan or Barriers: Patient newly admitted to unit, CSW  assessing for appropriate referrals.    Reason for Continuation of Hospitalization: Anxiety Depression Suicidal ideation Withdrawal symptoms  Estimated Length of Stay: 3-5 days  Attendees: Patient: Samuel Reyes 08/05/2019 9:00 AM  Physician: Larene Beach 08/05/2019 9:00 AM  Nursing: Rise Paganini, RN 08/05/2019 9:00 AM  RN Care Manager: 08/05/2019 9:00 AM  Social Worker: Stephanie Acre, [place] 08/05/2019 9:00 AM  Recreational Therapist:  08/05/2019 9:00 AM  Other:  08/05/2019 9:00 AM  Other:  08/05/2019 9:00 AM  Other: 08/05/2019 9:00 AM    Scribe for Treatment Team: Joellen Jersey, [place] 08/05/2019 9:00 AM

## 2019-08-06 LAB — CULTURE, BLOOD (ROUTINE X 2)
Culture: NO GROWTH
Culture: NO GROWTH

## 2019-08-06 NOTE — Progress Notes (Signed)
   08/06/19 2038  Psych Admission Type (Psych Patients Only)  Admission Status Voluntary  Psychosocial Assessment  Patient Complaints Depression  Eye Contact Fair  Facial Expression Sad  Affect Depressed  Speech Logical/coherent  Interaction Assertive  Motor Activity Slow  Appearance/Hygiene Unremarkable;In scrubs  Behavior Characteristics Cooperative  Mood Pleasant;Depressed  Aggressive Behavior  Effect No apparent injury  Thought Process  Coherency WDL  Content WDL  Delusions None reported or observed  Perception WDL  Hallucination None reported or observed  Judgment WDL  Confusion None  Danger to Self  Current suicidal ideation? Denies  Self-Injurious Behavior No self-injurious ideation or behavior indicators observed or expressed   Agreement Not to Harm Self Yes  Description of Agreement verbal  Danger to Others  Danger to Others None reported or observed   Pt pleasant to speak with. Depressed mood. Pt denies SI, HI, AVH and pain. Pt excited about discharge possibly on Saturday. Pt does say that his wife and son (both deceased) do speak to him at times and tell him "get yourself together." Pt endorses wanting to see his mother and daughter and grandchildren, but knows he has to stop drinking and "get hisself right" before they will consent to see him. Pt given encouragement in this area. Contracts for safety.

## 2019-08-06 NOTE — Progress Notes (Signed)
    08/06/19 1000  Psych Admission Type (Psych Patients Only)  Admission Status Voluntary  Psychosocial Assessment  Patient Complaints Depression  Eye Contact Fair  Facial Expression Sad  Affect Depressed  Speech Logical/coherent  Interaction Assertive  Motor Activity Slow  Appearance/Hygiene Disheveled;Body odor  Behavior Characteristics Cooperative  Mood Anxious  Aggressive Behavior  Effect No apparent injury  Thought Process  Coherency WDL  Content WDL  Delusions None reported or observed  Perception WDL  Hallucination None reported or observed  Judgment WDL  Confusion None  Danger to Self  Current suicidal ideation? Denies  Self-Injurious Behavior No self-injurious ideation or behavior indicators observed or expressed   Agreement Not to Harm Self Yes  Description of Agreement verbal contract  Danger to Others  Danger to Others None reported or observed

## 2019-08-06 NOTE — Progress Notes (Signed)
[facility] MD Progress Note  08/06/2019 4:23 PM Samuel Reyes  MRN:  [id number] Subjective:   Patient reports some improvement compared to admission.  Objective : I have discussed case with treatment team and have met with patient. 64 year old male, presented to ED on 1/2 for unresponsiveness, hypothermia, alcohol dependence. Endorsed depression, suicidal ideations with thoughts of walking into traffic.  Currently presents calm, in no acute distress and does not report significant withdrawal symptoms. Describes intermittent auditory hallucinations ( deceased family members calling him) , last time yesterday. Does not currently present internally preoccupied  Denies SI and contracts for safety. Currently patient presents alert , attentive, calm, pleasant on approach. No significant distal tremors or diaphoresis- no restlessness or psychomotor agitation. BP 145/88, pulse 98. Denies SI at this time. Denies medication side effects.  Principal Problem: Alcohol use disorder, severe, dependence ([place]) Diagnosis: Principal Problem:   Alcohol use disorder, severe, dependence ([place]) Active Problems:   Alcohol use with alcohol-induced mood disorder ([place])   MDD (major depressive disorder)  Total Time spent with patient: 20 minutes  Past Psychiatric History:   Past Medical History:  Past Medical History:  Diagnosis Date  . Alcoholism ([place])   . Barrett's esophagus   . CAD (coronary artery disease) 2002   MI, no intervention required  . COPD (chronic obstructive pulmonary disease) (HCC)    not on home O2  . Depression   . DTs (delirium tremens) ([place])   . GERD (gastroesophageal reflux disease)   . Headache   . Homeless   . Hypertension   . Stroke [facility]) 2006    Past Surgical History:  Procedure Laterality Date  . BACK SURGERY     3 cervical spine surgeries C4-C5 fused  . COLONOSCOPY N/A 01/04/2014   Procedure: COLONOSCOPY;  Surgeon: Danie Binder, MD;  Location: [facility] ENDO SU[facility];  Service:  Endoscopy;  Laterality: N/A;  1:45  . ESOPHAGOGASTRODUODENOSCOPY N/A 01/04/2014   Procedure: ESOPHAGOGASTRODUODENOSCOPY (EGD);  Surgeon: Danie Binder, MD;  Location: [facility] ENDO SUITE;  Service: Endoscopy;  Laterality: N/A;  . FINGER SURGERY Left    2nd, 3rd, & 4th fingers were cut off by table saw and reattached  . GASTRECTOMY    . HERNIA REPAIR    . INCISIONAL HERNIA REPAIR N/A 01/20/2014   Procedure: LAPAROSCOPIC RECURRENT  INCISIONAL HERNIA with mesh;  Surgeon: Edward Jolly, MD;  Location: [facility] ORS;  Service: General;  Laterality: N/A;  . rt knee arthroscopic surgery    . SHOULDER SURGERY Bilateral    3 surgeries on on left, 2 surgeries on right    Family History:  Family History  Problem Relation Age of Onset  . Cancer Father        bone  . Cancer Brother        lungs  . Stroke Maternal Grandmother   . Asthma Son        died at age 23 in his sleep   . Spina bifida Son        died at age 41   . Dementia Mother   . Colon cancer Neg Hx    Family Psychiatric  History:  Social History:  Social History   Substance and Sexual Activity  Alcohol Use Yes  . Alcohol/week: 15.0 standard drinks  . Types: 15 Cans of beer per week   Comment: last drink 2 days ago     Social History   Substance and Sexual Activity  Drug Use No   Comment: denied  using any drugs    Social History   Socioeconomic History  . Marital status: Widowed    Spouse name: Not on file  . Number of children: 3  . Years of education: Not on file  . Highest education level: Not on file  Occupational History  . Occupation: Disability  Tobacco Use  . Smoking status: Current Every Day Smoker    Packs/day: 1.00    Years: 52.00    Pack years: 52.00    Types: Cigarettes    Start date: 07/30/1966  . Smokeless tobacco: Never Used  . Tobacco comment: uses nicotine patch  Substance and Sexual Activity  . Alcohol use: Yes    Alcohol/week: 15.0 standard drinks    Types: 15 Cans of beer per week    Comment:  last drink 2 days ago  . Drug use: No    Comment: denied using any drugs  . Sexual activity: Not Currently  Other Topics Concern  . Not on file  Social History Narrative  . Not on file   Social Determinants of Health   Financial Resource Strain:   . Difficulty of Paying Living Expenses: Not on file  Food Insecurity:   . Worried About Charity fundraiser in the Last Year: Not on file  . Ran Out of Food in the Last Year: Not on file  Transportation Needs:   . Lack of Transportation (Medical): Not on file  . Lack of Transportation (Non-Medical): Not on file  Physical Activity:   . Days of Exercise per Week: Not on file  . Minutes of Exercise per Session: Not on file  Stress:   . Feeling of Stress : Not on file  Social Connections:   . Frequency of Communication with Friends and Family: Not on file  . Frequency of Social Gatherings with Friends and Family: Not on file  . Attends Religious Services: Not on file  . Active Member of Clubs or Organizations: Not on file  . Attends Archivist Meetings: Not on file  . Marital Status: Not on file   Additional Social History:      Sleep: improving   Appetite:  improving   Current Medications: Current Facility-Administered Medications  Medication Dose Route Frequency Provider Last Rate Last Admin  . acetaminophen (TYLENOL) tablet 650 mg  650 mg Oral Q6H PRN Mordecai Maes, NP   650 mg at 08/05/19 2123  . albuterol (PROVENTIL) (2.5 MG/3ML) 0.083% nebulizer solution 3 mL  3 mL Inhalation Q4H PRN Mordecai Maes, NP      . alum & mag hydroxide-simeth (MAALOX/MYLANTA) 200-200-20 MG/5ML suspension 30 mL  30 mL Oral Q4H PRN Mordecai Maes, NP   30 mL at 08/04/19 2135  . amLODipine (NORVASC) tablet 5 mg  5 mg Oral Daily Mordecai Maes, NP   5 mg at 08/06/19 0803  . fluticasone furoate-vilanterol (BREO ELLIPTA) 200-25 MCG/INH 1 puff  1 puff Inhalation Daily Mordecai Maes, NP   1 puff at 08/06/19 0803  . folic acid  (FOLVITE) tablet 1 mg  1 mg Oral Daily Mordecai Maes, NP   1 mg at 08/06/19 0804  . gabapentin (NEURONTIN) capsule 300 mg  300 mg Oral TID Mordecai Maes, NP   300 mg at 08/06/19 1135  . guaiFENesin-dextromethorphan (ROBITUSSIN DM) 100-10 MG/5ML syrup 5 mL  5 mL Oral Q4H PRN Mordecai Maes, NP      . LORazepam (ATIVAN) tablet 1 mg  1 mg Oral Q6H PRN Yandell Mcjunkins, Myer Peer, MD      .  magnesium hydroxide (MILK OF MAGNESIA) suspension 30 mL  30 mL Oral Daily PRN Mordecai Maes, NP      . multivitamin with minerals tablet 1 tablet  1 tablet Oral Daily Kailah Pennel, Myer Peer, MD   1 tablet at 08/06/19 0805  . nicotine (NICODERM CQ - dosed in mg/24 hours) patch 21 mg  21 mg Transdermal Daily Anike, Adaku C, NP   21 mg at 08/06/19 0622  . pantoprazole (PROTONIX) EC tablet 40 mg  40 mg Oral Daily Lindell Spar I, NP   40 mg at 08/06/19 2549  . sertraline (ZOLOFT) tablet 50 mg  50 mg Oral Daily Laneta Guerin, Myer Peer, MD   50 mg at 08/06/19 0804  . thiamine tablet 100 mg  100 mg Oral Daily Marja Adderley, Myer Peer, MD   100 mg at 08/06/19 0804  . traZODone (DESYREL) tablet 50 mg  50 mg Oral QHS PRN Mordecai Maes, NP   50 mg at 08/05/19 2123    Lab Results:  Results for orders placed or performed during the hospital encounter of 08/04/19 (from the past 48 hour(s))  Hemoglobin A1c     Status: None   Collection Time: 08/05/19  6:20 AM  Result Value Ref Range   Hgb A1c MFr Bld 5.4 4.8 - 5.6 %    Comment: (NOTE) Pre diabetes:          5.7%-6.4% Diabetes:              >6.4% Glycemic control for   <7.0% adults with diabetes    Mean Plasma Glucose 108.28 mg/dL    Comment: Performed at [facility], [place] [address] [place], [place] [address]  Lipid panel     Status: None   Collection Time: 08/05/19  6:20 AM  Result Value Ref Range   Cholesterol 161 0 - 200 mg/dL   Triglycerides 78 <150 mg/dL   HDL 66 >40 mg/dL   Total CHOL/HDL Ratio 2.4 RATIO   VLDL 16 0 - 40 mg/dL   LDL Cholesterol 79 0 - 99 mg/dL     Comment:        Total Cholesterol/HDL:CHD Risk Coronary Heart Disease Risk Table                     Men   Women  1/2 Average Risk   3.4   3.3  Average Risk       5.0   4.4  2 X Average Risk   9.6   7.1  3 X Average Risk  23.4   11.0        Use the calculated Patient Ratio above and the CHD Risk Table to determine the patient's CHD Risk.        ATP III CLASSIFICATION (LDL):  <100     mg/dL   Optimal  100-129  mg/dL   Near or Above                    Optimal  130-159  mg/dL   Borderline  160-189  mg/dL   High  >190     mg/dL   Very High Performed at [place] [address], [place], [place] [address]   TSH     Status: None   Collection Time: 08/05/19  6:20 AM  Result Value Ref Range   TSH 2.075 0.350 - 4.500 uIU/mL    Comment: Performed by a 3rd Generation assay with a functional sensitivity of <=0.01  uIU/mL. Performed at [facility], [place] [address], [place], [place] [address]     Blood Alcohol level:  Lab Results  Component Value Date   ETH 274 (H) 08/01/2019   ETH 119 (H) 48/54/6270    Metabolic Disorder Labs: Lab Results  Component Value Date   HGBA1C 5.4 08/05/2019   MPG 108.28 08/05/2019   MPG 111.15 04/01/2019   No results found for: PROLACTIN Lab Results  Component Value Date   CHOL 161 08/05/2019   TRIG 78 08/05/2019   HDL 66 08/05/2019   CHOLHDL 2.4 08/05/2019   VLDL 16 08/05/2019   LDLCALC 79 08/05/2019   LDLCALC 134 (H) 04/01/2019    Physical Findings: AIMS: Facial and Oral Movements Muscles of Facial Expression: None, normal Lips and Perioral Area: None, normal Jaw: None, normal Tongue: None, normal,Extremity Movements Upper (arms, wrists, hands, fingers): None, normal Lower (legs, knees, ankles, toes): None, normal, Trunk Movements Neck, shoulders, hips: None, normal, Overall Severity Severity of abnormal movements (highest score from questions above): None, normal Incapacitation due to abnormal  movements: None, normal Patient's awareness of abnormal movements (rate only patient's report): No Awareness, Dental Status Current problems with teeth and/or dentures?: No Does patient usually wear dentures?: No  CIWA:  CIWA-Ar Total: 0 COWS:  COWS Total Score: 3  Musculoskeletal: Strength & Muscle Tone: within normal limits Gait & Station: normal Patient leans: N/A  Psychiatric Specialty Exam: Physical Exam  Review of Systems denies chest pain , no shortness of breath, does report reflux symptoms and endorses past history of GERD symptoms. No vomiting   Blood pressure (!) 145/88, pulse 98, temperature 97.7 F (36.5 C), temperature source Oral, resp. rate 16, height 5' 3"  (1.6 m), weight 65.3 kg, SpO2 94 %.Body mass index is 25.51 kg/m.  General Appearance: Fairly Groomed  Eye Contact:  Good  Speech:  Normal Rate  Volume:  Normal  Mood:  partially improved mood   Affect:  more reactive, smiles at times appropriately  Thought Process:  Linear and Descriptions of Associations: Intact  Orientation:  Other:  fully alert and attentive  Thought Content:  no hallucinations,no delusions at this time, reports intermittent brief auditory hallucinations as described above, none today so far  Suicidal Thoughts:  No currently denies suicidal or self injurious ideations , denies homicidal or violent ideations  Homicidal Thoughts:  No  Memory:  recent and remote grossly intact   Judgement:  Fair/ improving  Insight:  Fair  Psychomotor Activity:  Normal at this time not presenting with significant tremors or diaphoresis  Concentration:  Concentration: Good and Attention Span: Good  Recall:  Good  Fund of Knowledge:  Good  Language:  Good  Akathisia:  Negative  Handed:  Right  AIMS (if indicated):     Assets:  Communication Skills Desire for Improvement Resilience  ADL's:  Intact  Cognition:  WNL  Sleep:  Number of Hours: 5.25   Assessment -  64 year old male, presented to ED on 1/2  for unresponsiveness, hypothermia, alcohol dependence. Endorsed depression, suicidal ideations with thoughts of walking into traffic.  Patient presents with partially improved mood and currently denies SI. Does not present with significant alcohol WDL symptoms at this time. Tolerating medications well .  Treatment Plan Summary: Daily contact with patient to assess and evaluate symptoms and progress in treatment, Medication management, Plan inpatient treatment  and medications as below  Encourage group and milieu participation Encourage efforts to work on sobriety and relapse prevention Continue  Neurontin 300 mgrs TID for anxiety,pain Continue Ativan PRN for alcohol WDL as per CIWA protocol Continue Thiamine and Folate supplementation  Continue Zoloft to 50 mgr QDAY for depression Continue Protonix 40 mgrs QDAY for GERD symptoms Continue Trazodone 50 mgrs QHS PRN for insomnia Continue Norvasc 5 mgrs QDAY for HTN  Treatment team working on disposition planning Jenne Campus, MD 08/06/2019, 4:23 PM

## 2019-08-07 MED ORDER — TRAZODONE HCL 150 MG PO TABS
150.0000 mg | ORAL_TABLET | Freq: Every evening | ORAL | Status: DC | PRN
  Administered 2019-08-07: 150 mg via ORAL
  Filled 2019-08-07: qty 1

## 2019-08-07 NOTE — Progress Notes (Signed)
Recreation Therapy Notes  Date:  1.8.21 Time: 0930 Location: [address] Dayroom  Group Topic: Stress Management  Goal Area(s) Addresses:  Patient will identify positive stress management techniques. Patient will identify benefits of using stress management post d/c.  Behavioral Response:  Engaged  Intervention: Stress Management  Activity :  Meditation.  LRT played a meditation that focused on choices.  Patients were to listen and follow along as meditation played to engage.  Education:  Stress Management, Discharge Planning.   Education Outcome: Acknowledges Education  Clinical Observations/Feedback: Pt attended and participated in activity.    [name], LRT/CTRS        Ria Comment, Hibah Odonnell A 08/07/2019 11:06 AM

## 2019-08-07 NOTE — Progress Notes (Addendum)
[facility] MD Progress Note  08/07/2019 2:43 PM Samuel Reyes  MRN:  [id number]  Subjective: Samuel Reyes reports, "I;m not feeling too bad today. Being around people here helps me deal with my depression. I'm doing well overall, except that I'm not sleeping well at night. I used to be on Trazodone 200 mg at night for sleep, but I'm only getting 50 here. That is the reason I'm not sleep well at night. I hear my wife's voice calling me. It happens a lot. I'm feeling a lot better".   Objective : I have discussed case with treatment team and have met with patient. 64 year old male, presented to ED on 1/2 for unresponsiveness, hypothermia, alcohol dependence. Endorsed depression, suicidal ideations with thoughts of walking into traffic. Jawann is seen, chart reviewed. The chart findings discussed with the teatment team. He currently presents calm, in no acute distress and does not report any significant withdrawal symptoms. Describes intermittent auditory hallucinations (deceased wife members calling him), last time heard her voice was this morning. Does not currently present internally preoccupied. Denies any SIHI, able to contract for safety. Currently patient presents alert, attentive, calm, pleasant on approach. No significant distal tremors or diaphoresis- no restlessness or psychomotor agitation. BP 142/78, pulse 96. Denies VH at this time. Denies medication side effects. He is agreement to continue current plan of care as already in progress. He says upon discharge, he will be going to a home in [place] here in [place], [place]. His medication has been adjusted to meet his needs. See plan of care below.  Principal Problem: Alcohol use disorder, severe, dependence ([place]) Diagnosis: Principal Problem:   Alcohol use disorder, severe, dependence ([place]) Active Problems:   Alcohol use with alcohol-induced mood disorder ([place])   MDD (major depressive disorder)  Total Time spent with patient: 15  minutes  Past Psychiatric History: See H&P  Past Medical History:  Past Medical History:  Diagnosis Date  . Alcoholism ([place])   . Barrett's esophagus   . CAD (coronary artery disease) 2002   MI, no intervention required  . COPD (chronic obstructive pulmonary disease) (H[facility])    not on home O2  . Depression   . DTs (delirium tremens) ([place])   . GERD (gastroesophageal reflux disease)   . Headache   . Homeless   . Hypertension   . Stroke [facility]) 2006    Past Surgical History:  Procedure Laterality Date  . BACK SURGERY     3 cervical spine surgeries C4-C5 fused  . COLONOSCOPY N/A 01/04/2014   Procedure: COLONOSCOPY;  Surgeon: Danie Binder, MD;  Location: [facility] ENDO SUITE;  Service: Endoscopy;  Laterality: N/A;  1:45  . ESOPHAGOGASTRODUODENOSCOPY N/A 01/04/2014   Procedure: ESOPHAGOGASTRODUODENOSCOPY (EGD);  Surgeon: Danie Binder, MD;  Location: [facility] ENDO SUITE;  Service: Endoscopy;  Laterality: N/A;  . FINGER SURGERY Left    2nd, 3rd, & 4th fingers were cut off by table saw and reattached  . GASTRECTOMY    . HERNIA REPAIR    . INCISIONAL HERNIA REPAIR N/A 01/20/2014   Procedure: LAPAROSCOPIC RECURRENT  INCISIONAL HERNIA with mesh;  Surgeon: Edward Jolly, MD;  Location: [facility] ORS;  Service: General;  Laterality: N/A;  . rt knee arthroscopic surgery    . SHOULDER SURGERY Bilateral    3 surgeries on on left, 2 surgeries on right    Family History:  Family History  Problem Relation Age of Onset  . Cancer Father        bone  .  Cancer Brother        lungs  . Stroke Maternal Grandmother   . Asthma Son        died at age 18 in his sleep   . Spina bifida Son        died at age 71   . Dementia Mother   . Colon cancer Neg Hx    Family Psychiatric  History: See H&P  Social History:  Social History   Substance and Sexual Activity  Alcohol Use Yes  . Alcohol/week: 15.0 standard drinks  . Types: 15 Cans of beer per week   Comment: last drink 2 days ago     Social History    Substance and Sexual Activity  Drug Use No   Comment: denied using any drugs    Social History   Socioeconomic History  . Marital status: Widowed    Spouse name: Not on file  . Number of children: 3  . Years of education: Not on file  . Highest education level: Not on file  Occupational History  . Occupation: Disability  Tobacco Use  . Smoking status: Current Every Day Smoker    Packs/day: 1.00    Years: 52.00    Pack years: 52.00    Types: Cigarettes    Start date: 07/30/1966  . Smokeless tobacco: Never Used  . Tobacco comment: uses nicotine patch  Substance and Sexual Activity  . Alcohol use: Yes    Alcohol/week: 15.0 standard drinks    Types: 15 Cans of beer per week    Comment: last drink 2 days ago  . Drug use: No    Comment: denied using any drugs  . Sexual activity: Not Currently  Other Topics Concern  . Not on file  Social History Narrative  . Not on file   Social Determinants of Health   Financial Resource Strain:   . Difficulty of Paying Living Expenses: Not on file  Food Insecurity:   . Worried About Charity fundraiser in the Last Year: Not on file  . Ran Out of Food in the Last Year: Not on file  Transportation Needs:   . Lack of Transportation (Medical): Not on file  . Lack of Transportation (Non-Medical): Not on file  Physical Activity:   . Days of Exercise per Week: Not on file  . Minutes of Exercise per Session: Not on file  Stress:   . Feeling of Stress : Not on file  Social Connections:   . Frequency of Communication with Friends and Family: Not on file  . Frequency of Social Gatherings with Friends and Family: Not on file  . Attends Religious Services: Not on file  . Active Member of Clubs or Organizations: Not on file  . Attends Archivist Meetings: Not on file  . Marital Status: Not on file   Additional Social History:   Sleep: Good  Appetite:  improving   Current Medications: Current Facility-Administered Medications   Medication Dose Route Frequency Provider Last Rate Last Admin  . acetaminophen (TYLENOL) tablet 650 mg  650 mg Oral Q6H PRN Mordecai Maes, NP   650 mg at 08/07/19 0832  . albuterol (PROVENTIL) (2.5 MG/3ML) 0.083% nebulizer solution 3 mL  3 mL Inhalation Q4H PRN Mordecai Maes, NP      . alum & mag hydroxide-simeth (MAALOX/MYLANTA) 200-200-20 MG/5ML suspension 30 mL  30 mL Oral Q4H PRN Mordecai Maes, NP   30 mL at 08/04/19 2135  . amLODipine (NORVASC) tablet 5 mg  5 mg Oral Daily Mordecai Maes, NP   5 mg at 08/07/19 0830  . fluticasone furoate-vilanterol (BREO ELLIPTA) 200-25 MCG/INH 1 puff  1 puff Inhalation Daily Mordecai Maes, NP   1 puff at 08/07/19 [phone]  . folic acid (FOLVITE) tablet 1 mg  1 mg Oral Daily Mordecai Maes, NP   1 mg at 08/07/19 0830  . gabapentin (NEURONTIN) capsule 300 mg  300 mg Oral TID Mordecai Maes, NP   300 mg at 08/07/19 1251  . guaiFENesin-dextromethorphan (ROBITUSSIN DM) 100-10 MG/5ML syrup 5 mL  5 mL Oral Q4H PRN Mordecai Maes, NP      . LORazepam (ATIVAN) tablet 1 mg  1 mg Oral Q6H PRN Jailine Lieder, Myer Peer, MD      . magnesium hydroxide (MILK OF MAGNESIA) suspension 30 mL  30 mL Oral Daily PRN Mordecai Maes, NP      . multivitamin with minerals tablet 1 tablet  1 tablet Oral Daily Lorne Winkels, Myer Peer, MD   1 tablet at 08/07/19 0830  . nicotine (NICODERM CQ - dosed in mg/24 hours) patch 21 mg  21 mg Transdermal Daily Anike, Adaku C, NP   21 mg at 08/07/19 0641  . pantoprazole (PROTONIX) EC tablet 40 mg  40 mg Oral Daily Lindell Spar I, NP   40 mg at 08/07/19 0641  . sertraline (ZOLOFT) tablet 50 mg  50 mg Oral Daily Cecely Rengel, Myer Peer, MD   50 mg at 08/07/19 0830  . thiamine tablet 100 mg  100 mg Oral Daily Ceri Mayer, Myer Peer, MD   100 mg at 08/07/19 0830  . traZODone (DESYREL) tablet 150 mg  150 mg Oral QHS PRN Lindell Spar I, NP       Lab Results:  No results found for this or any previous visit (from the past 48 hour(s)).  Blood Alcohol level:   Lab Results  Component Value Date   ETH 274 (H) 08/01/2019   ETH 119 (H) 92/44/6286   Metabolic Disorder Labs: Lab Results  Component Value Date   HGBA1C 5.4 08/05/2019   MPG 108.28 08/05/2019   MPG 111.15 04/01/2019   No results found for: PROLACTIN Lab Results  Component Value Date   CHOL 161 08/05/2019   TRIG 78 08/05/2019   HDL 66 08/05/2019   CHOLHDL 2.4 08/05/2019   VLDL 16 08/05/2019   LDLCALC 79 08/05/2019   LDLCALC 134 (H) 04/01/2019   Physical Findings: AIMS: Facial and Oral Movements Muscles of Facial Expression: None, normal Lips and Perioral Area: None, normal Jaw: None, normal Tongue: None, normal,Extremity Movements Upper (arms, wrists, hands, fingers): None, normal Lower (legs, knees, ankles, toes): None, normal, Trunk Movements Neck, shoulders, hips: None, normal, Overall Severity Severity of abnormal movements (highest score from questions above): None, normal Incapacitation due to abnormal movements: None, normal Patient's awareness of abnormal movements (rate only patient's report): No Awareness, Dental Status Current problems with teeth and/or dentures?: No Does patient usually wear dentures?: No  CIWA:  CIWA-Ar Total: 0 COWS:  COWS Total Score: 3  Musculoskeletal: Strength & Muscle Tone: within normal limits Gait & Station: normal Patient leans: N/A  Psychiatric Specialty Exam: Physical Exam  Nursing note and vitals reviewed. Constitutional: He is oriented to person, place, and time. He appears well-developed.  Cardiovascular: Normal rate.  Respiratory: Effort normal.  Hx. COPD  Genitourinary:    Genitourinary Comments: Deferred   Musculoskeletal:        General: Normal range of motion.     Cervical back: Normal  range of motion.  Neurological: He is alert and oriented to person, place, and time.  Skin: Skin is warm and dry.    Review of Systems  Constitutional: Negative for chills, diaphoresis and fever.  HENT: Negative for  congestion, rhinorrhea, sneezing and sore throat.   Respiratory: Negative for cough, shortness of breath and wheezing.   Cardiovascular: Negative for chest pain and palpitations.  Gastrointestinal: Negative for diarrhea, nausea and vomiting.  Genitourinary: Negative for difficulty urinating.  Musculoskeletal: Negative for myalgias.  Skin: Negative for color change.  Neurological: Negative for dizziness and headaches.  Psychiatric/Behavioral: Positive for dysphoric mood ("Improving"), hallucinations ("I hear my wife's voice calling me") and sleep disturbance. Negative for agitation, behavioral problems, confusion, decreased concentration, self-injury and suicidal ideas. The patient is not nervous/anxious and is not hyperactive.   Denies chest pain , no shortness of breath, does report reflux symptoms and endorses past history of GERD symptoms. No vomiting.   Blood pressure (!) 142/78, pulse 96, temperature 97.8 F (36.6 C), temperature source Oral, resp. rate 16, height 5' 3"  (1.6 m), weight 65.3 kg, SpO2 94 %.Body mass index is 25.51 kg/m.  General Appearance: Fairly Groomed  Eye Contact:  Good  Speech:  Normal Rate  Volume:  Normal  Mood:  partially improved mood   Affect:  more reactive, smiles at times appropriately  Thought Process:  Linear and Descriptions of Associations: Intact  Orientation:  Other:  fully alert and attentive  Thought Content:  no hallucinations,no delusions at this time, reports intermittent brief auditory hallucinations as described above, none today so far.  Suicidal Thoughts:  No currently denies suicidal or self injurious ideations , denies homicidal or violent ideations  Homicidal Thoughts:  No  Memory:  recent and remote grossly intact   Judgement:  Fair/ improving  Insight:  Fair  Psychomotor Activity:  Normal at this time not presenting with significant tremors or diaphoresis  Concentration:  Concentration: Good and Attention Span: Good  Recall:  Good   Fund of Knowledge:  Good  Language:  Good  Akathisia:  Negative  Handed:  Right  AIMS (if indicated):     Assets:  Communication Skills Desire for Improvement Resilience  ADL's:  Intact  Cognition:  WNL  Sleep:  Number of Hours: 6.75   Assessment -  64 year old male, presented to ED on 1/2 for unresponsiveness, hypothermia, alcohol dependence. Endorsed depression, suicidal ideations with thoughts of walking into traffic.   Patient presents with partially improved mood and currently denies SI. Does not present with significant alcohol WDL symptoms at this time. Tolerating medications well. Complaining of not sleeping well at night.  Treatment Plan Summary: Daily contact with patient to assess and evaluate symptoms and progress in treatment, Medication management, Plan inpatient treatment  and medications as below  Encourage group and milieu participation Encourage efforts to work on sobriety and relapse prevention Continue Neurontin 300 mgrs TID for anxiety,pain Continue Ativan PRN for alcohol WDL as per CIWA protocol Continue Thiamine and Folate supplementation  Continue Zoloft to 50 mgr QDAY for depression Continue Protonix 40 mgrs QDAY for GERD symptoms Increased Trazodone from 50 mgrs to 150 mg QHS PRN for insomnia Continue Norvasc 5 mgrs QDAY for HTN . Treatment team working on disposition planning.  Lindell Spar, NP, PMHNP, FNP-BC 08/07/2019, 2:43 PMPatient ID: Scherrie Merritts, male   DOB: [birth year], 64 y.o.   MRN: [id number]  Agree with NP Note

## 2019-08-07 NOTE — Progress Notes (Addendum)
   08/07/19 2015  Psych Admission Type (Psych Patients Only)  Admission Status Voluntary  Psychosocial Assessment  Patient Complaints None  Eye Contact Fair  Facial Expression Sad  Affect Depressed  Speech Logical/coherent  Interaction Assertive  Motor Activity Slow  Appearance/Hygiene In scrubs  Behavior Characteristics Cooperative  Mood Depressed;Pleasant  Aggressive Behavior  Effect No apparent injury  Thought Process  Coherency WDL  Content WDL  Delusions None reported or observed  Perception WDL  Hallucination None reported or observed  Judgment WDL  Confusion None  Danger to Self  Current suicidal ideation? Denies  Self-Injurious Behavior No self-injurious ideation or behavior indicators observed or expressed   Agreement Not to Harm Self Yes  Description of Agreement verbal  Danger to Others  Danger to Others None reported or observed  Patient has been up in the dayroom watching tv and interacting with select peers. He is hopeful to discharge on tomorrow but is unclear where he will be going for treatment. Writer encouraged him to speak with his Education officer, museum concerning this matter.

## 2019-08-07 NOTE — Progress Notes (Signed)
D. Pt is friendly upon approach- calm and cooperative-observed in the milieu interacting well with peers. Per pt's self inventory, pt rated her depression, hopelessness and anxiety a 7/6/6, respectively. Pt endorses passive SI - no plan -and verbally agrees to contact staff before acting on any self harm thoughts. Pt writes that his goal today is "thinking of good times to come".A. Labs and vitals monitored. Pt compliant with medications. Pt supported emotionally and encouraged to express concerns and ask questions.   R. Pt remains safe with 15 minute checks. Will continue POC.

## 2019-08-08 MED ORDER — NICOTINE 21 MG/24HR TD PT24: 21.0000 mg | MEDICATED_PATCH | Freq: Every day | TRANSDERMAL | 0 refills | Status: DC

## 2019-08-08 MED ORDER — GABAPENTIN 300 MG PO CAPS: 300.0000 mg | ORAL_CAPSULE | Freq: Three times a day (TID) | ORAL | 0 refills | Status: DC

## 2019-08-08 MED ORDER — TRAZODONE HCL 150 MG PO TABS: 150.0000 mg | ORAL_TABLET | Freq: Every evening | ORAL | 0 refills | Status: DC | PRN

## 2019-08-08 MED ORDER — SERTRALINE HCL 50 MG PO TABS: 50.0000 mg | ORAL_TABLET | Freq: Every day | ORAL | 0 refills | Status: DC

## 2019-08-08 MED ORDER — FOLIC ACID 1 MG PO TABS: 1.0000 mg | ORAL_TABLET | Freq: Every day | ORAL | 0 refills | Status: DC

## 2019-08-08 MED ORDER — FLUTICASONE FUROATE-VILANTEROL 200-25 MCG/INH IN AEPB: 1.0000 | INHALATION_SPRAY | Freq: Every day | RESPIRATORY_TRACT | 0 refills | Status: DC

## 2019-08-08 MED ORDER — ALBUTEROL SULFATE HFA 108 (90 BASE) MCG/ACT IN AERS: 1.0000 | INHALATION_SPRAY | Freq: Four times a day (QID) | RESPIRATORY_TRACT | 0 refills | Status: DC | PRN

## 2019-08-08 MED ORDER — ALBUTEROL SULFATE HFA 108 (90 BASE) MCG/ACT IN AERS: 1.0000 | INHALATION_SPRAY | Freq: Four times a day (QID) | RESPIRATORY_TRACT | Status: DC | PRN

## 2019-08-08 MED ORDER — AMLODIPINE BESYLATE 5 MG PO TABS: 5.0000 mg | ORAL_TABLET | Freq: Every day | ORAL | 0 refills | Status: DC

## 2019-08-08 MED ORDER — THIAMINE HCL 100 MG PO TABS: 100.0000 mg | ORAL_TABLET | Freq: Every day | ORAL | 0 refills | Status: DC

## 2019-08-08 MED ORDER — PANTOPRAZOLE SODIUM 40 MG PO TBEC: 40.0000 mg | DELAYED_RELEASE_TABLET | Freq: Two times a day (BID) | ORAL | 0 refills | Status: DC

## 2019-08-08 NOTE — BHH Suicide Risk Assessment (Signed)
[facility] Discharge Suicide Risk Assessment   Principal Problem: Alcohol use disorder, severe, dependence (Samuel Reyes) Discharge Diagnoses: Principal Problem:   Alcohol use disorder, severe, dependence ([name]) Active Problems:   Alcohol use with alcohol-induced mood disorder ([name])   MDD (major depressive disorder)   Total Time spent with patient: 30 minutes  Musculoskeletal: Strength & Muscle Tone: within normal limits Gait & Station: normal Patient leans: N/A  Psychiatric Specialty Exam: Review of Systems currently denies headache, denies chest pain, denies shortness of breath or cough, no vomiting  Blood pressure 129/82, pulse (!) 121, temperature 98 F (36.7 C), temperature source Oral, resp. rate 16, height 5\' 3"  (1.6 m), weight 65.3 kg, SpO2 94 %.Body mass index is 25.51 kg/m.  General Appearance: Improving grooming  Eye Contact::  Good  Speech:  Normal Rate409  Volume:  Normal  Mood:  Improving mood, states "I feel a lot better"  Affect:  Appropriate and More reactive  Thought Process:  Linear and Descriptions of Associations: Intact  Orientation:  Other:  Fully alert and attentive  Thought Content:  Denies current hallucinations, known delusions are expressed, does not appear internally preoccupied  Suicidal Thoughts:  No denies suicidal or self-injurious ideations, no homicidal or violent ideations  Homicidal Thoughts:  No  Memory:  Recent and remote grossly intact  Judgement:  Other:  Improving   Insight:  Fair/improving  Psychomotor Activity:  Normal  Concentration:  Good  Recall:  Good  Fund of Knowledge:Good  Language: Good  Akathisia:  Negative  Handed:  Right  AIMS (if indicated):     Assets:  Desire for Improvement Resilience  Sleep:  Number of Hours: 6.75  Cognition: WNL  ADL's:  Intact   Mental Status Per Nursing Assessment::   On Admission:  Suicidal ideation indicated by patient, Self-harm thoughts  Demographic Factors:  64 year old male, homeless  Loss  Factors: Homelessness, loss of loved ones, alcohol use disorder  Historical Factors: History of alcohol use disorder, history of past psychiatric admissions  Risk Reduction Factors:   Positive coping skills or problem solving skills  Continued Clinical Symptoms:  Currently patient presents alert, attentive, calm, pleasant on approach, describes mood is much improved and currently denies depression.  Affect is appropriate and more reactive.  No thought disorder.  Denies suicidal or self-injurious ideations.  No homicidal or violent ideations.  Presents future oriented.. Behavior on unit in good control, pleasant on approach. He reports he is relieved and "real happy"that he has found housing  (homelessness has been a significant stressor).  States he plans to move in with a roommate. Currently denies medication side effects  Cognitive Features That Contribute To Risk:  No gross cognitive deficits noted upon discharge. Is alert , attentive, and oriented x 3   Suicide Risk:  Mild:  Suicidal ideation of limited frequency, intensity, duration, and specificity.  There are no identifiable plans, no associated intent, mild dysphoria and related symptoms, good self-control (both objective and subjective assessment), few other risk factors, and identifiable protective factors, including available and accessible social support.  Follow-up [place] to.   Specialty: Professional Counselor Why: Please follow up with clinic for outpatient services during walk-in hours; Monday-Friday 8:30a.-12:00p and 1:00p-2:30p. Be sure to bring the following; photo ID, insurance card, SSN, current medications and discharge paperwork from this hospitalization. Contact information: [facility] [place] [id number] [phone]        [facility]  Follow up.   Why: This organization provides free grief counseling.   Please feel free to contact them directly to schedule an appointment. Contact information: [place] [phone]          Plan Of Care/Follow-up recommendations:  Activity:  As tolerated Diet:  Heart healthy Tests:  NA Other:  See below  Patient is expressing readiness for discharge today.  There are no grounds for involuntary commitment.  He is leaving unit in good spirits.  Plans to follow-up as above.  Encouraged him to consider 12-step program participation.  Also plans to continue going to [facility], which he has attended in the past, for medical management as needed.  Jenne Campus, MD 08/08/2019, 11:26 AM

## 2019-08-08 NOTE — Progress Notes (Signed)
  [facility] Adult Case Management Discharge Plan :  Will you be returning to the same living situation after discharge:  No.  Going home with a friend who was also in the hospital At discharge, do you have transportation home?: Yes,  arranged by patient Do you have the ability to pay for your medications: Yes,  has Medicaid  Release of information consent forms completed and emailed to Medical Records, then turned in to Medical Records by CSW.   Patient to Follow up at: Follow-up [place] to.   Specialty: Professional Counselor Why: Please follow up with clinic for outpatient services during walk-in hours; Monday-Friday 8:30a.-12:00p and 1:00p-2:30p. Be sure to bring the following; photo ID, insurance card, SSN, current medications and discharge paperwork from this hospitalization. Contact information: [facility] [place] [address] [phone]        [facility] Follow up.   Why: This organization provides free grief counseling.  Please feel free to contact them directly to schedule an appointment. Contact information: [place] [phone]          Next level of care provider has access to [place] and Suicide Prevention discussed: Yes,  with mother  Have you used any form of tobacco in the last 30 days? (Cigarettes, Smokeless Tobacco, Cigars, and/or Pipes): Yes  Has patient been referred to the Quitline?: Patient refused referral  Patient has been referred for addiction treatment: Yes  Maretta Los, LCSW 08/08/2019, 9:54 AM

## 2019-08-08 NOTE — Discharge Summary (Addendum)
Physician Discharge Summary Note  Patient:  Samuel Reyes is an 64 y.o., male MRN:  [id number] DOB:  March 14, 1956 Patient phone:  [phone] (home)  Patient address:   [place] [address],  Total Time spent with patient: 15 minutes  Date of Admission:  08/04/2019 Date of Discharge: 08/08/19  Reason for Admission:  Alcohol dependence with suicidal ideation  Principal Problem: Alcohol use disorder, severe, dependence ([place]) Discharge Diagnoses: Principal Problem:   Alcohol use disorder, severe, dependence ([place]) Active Problems:   Alcohol use with alcohol-induced mood disorder ([place])   MDD (major depressive disorder)   Past Psychiatric History: History of alcohol use disorder with multiple prior hospitalizations.  Past Medical History:  Past Medical History:  Diagnosis Date  . Alcoholism ([place])   . Barrett's esophagus   . CAD (coronary artery disease) 2002   MI, no intervention required  . COPD (chronic obstructive pulmonary disease) ([facility])    not on home O2  . Depression   . DTs (delirium tremens) ([place])   . GERD (gastroesophageal reflux disease)   . Headache   . Homeless   . Hypertension   . Stroke [facility]) 2006    Past Surgical History:  Procedure Laterality Date  . BACK SURGERY     3 cervical spine surgeries C4-C5 fused  . COLONOSCOPY N/A 01/04/2014   Procedure: COLONOSCOPY;  Surgeon: Danie Binder, MD;  Location: [facility];  Service: Endoscopy;  Laterality: N/A;  1:45  . ESOPHAGOGASTRODUODENOSCOPY N/A 01/04/2014   Procedure: ESOPHAGOGASTRODUODENOSCOPY (EGD);  Surgeon: Danie Binder, MD;  Location: [facility];  Service: Endoscopy;  Laterality: N/A;  . FINGER SURGERY Left    2nd, 3rd, & 4th fingers were cut off by table saw and reattached  . GASTRECTOMY    . HERNIA REPAIR    . INCISIONAL HERNIA REPAIR N/A 01/20/2014   Procedure: LAPAROSCOPIC RECURRENT  INCISIONAL HERNIA with mesh;  Surgeon: Edward Jolly, MD;  Location: [facility] ORS;  Service:  General;  Laterality: N/A;  . rt knee arthroscopic surgery    . SHOULDER SURGERY Bilateral    3 surgeries on on left, 2 surgeries on right    Family History:  Family History  Problem Relation Age of Onset  . Cancer Father        bone  . Cancer Brother        lungs  . Stroke Maternal Grandmother   . Asthma Son        died at age 58 in his sleep   . Spina bifida Son        died at age 92   . Dementia Mother   . Colon cancer Neg Hx    Family Psychiatric  History: Father and two sisters with alcohol use disorder. Social History:  Social History   Substance and Sexual Activity  Alcohol Use Yes  . Alcohol/week: 15.0 standard drinks  . Types: 15 Cans of beer per week   Comment: last drink 2 days ago     Social History   Substance and Sexual Activity  Drug Use No   Comment: denied using any drugs    Social History   Socioeconomic History  . Marital status: Widowed    Spouse name: Not on file  . Number of children: 3  . Years of education: Not on file  . Highest education level: Not on file  Occupational History  . Occupation: Disability  Tobacco Use  . Smoking status: Current Every Day  Smoker    Packs/day: 1.00    Years: 52.00    Pack years: 52.00    Types: Cigarettes    Start date: 07/30/1966  . Smokeless tobacco: Never Used  . Tobacco comment: uses nicotine patch  Substance and Sexual Activity  . Alcohol use: Yes    Alcohol/week: 15.0 standard drinks    Types: 15 Cans of beer per week    Comment: last drink 2 days ago  . Drug use: No    Comment: denied using any drugs  . Sexual activity: Not Currently  Other Topics Concern  . Not on file  Social History Narrative  . Not on file   Social Determinants of Health   Financial Resource Strain:   . Difficulty of Paying Living Expenses: Not on file  Food Insecurity:   . Worried About Charity fundraiser in the Last Year: Not on file  . Ran Out of Food in the Last Year: Not on file  Transportation Needs:   .  Lack of Transportation (Medical): Not on file  . Lack of Transportation (Non-Medical): Not on file  Physical Activity:   . Days of Exercise per Week: Not on file  . Minutes of Exercise per Session: Not on file  Stress:   . Feeling of Stress : Not on file  Social Connections:   . Frequency of Communication with Friends and Family: Not on file  . Frequency of Social Gatherings with Friends and Family: Not on file  . Attends Religious Services: Not on file  . Active Member of Clubs or Organizations: Not on file  . Attends Archivist Meetings: Not on file  . Marital Status: Not on file    Hospital Course:  From admission H&P: 64 year old male, presented to ED on 1/2 after being found outside unresponsive, hypothermic, with core body temp of 83. Was initially  admitted for medical management/ stabilization. He also reported worsening  depression and suicidal ideations, with thoughts of walking into traffic .He was seen by Psychiatry Consultant who recommended inpatient psychiatric admission upon medical clearance. He describes significant contributing stressors, to include homelessness, limited social support system, and reports loss of wife ( who died 3 years ago from complications of Chron's ), and son ( who passed away 8 years ago related to asthma) He also reports he had been off his psychiatric medications for several weeks. Patient is known to our unit from past admission in 03/2019 at which time presented for heavy , daily drinking , second degree sunburns, and suicidal ideations similar to above .   At the time was discharged on Neurontin and Zoloft. History of alcohol use disorder. Reports he had been drinking up to 5  40 ounce beers per day. Denies drug abuse.  This was one of numerous admissions to [facility] for Mr. [place]. He was admitted for suicidal ideation after being found outside unresponsive with core temperature of 83. He was admitted to medical unit and then transferred to [facility]  for psychiatric treatment. He remained on the [facility] unit for four days. He was restarted on Zoloft, trazodone, and Neurontin. He participated in group therapy on the unit. He responded well to treatment with no adverse effects reported. He has shown improved mood, affect, sleep, and interaction. He denies any SI/HI/AVH and contracts for safety. He denies withdrawal symptoms. He is discharging on the medications listed below. He agrees to follow up at [facility] and [facility] (see below). Patient  is provided with prescriptions for medications upon discharge. He is discharging home with a friend.  Physical Findings: AIMS: Facial and Oral Movements Muscles of Facial Expression: None, normal Lips and Perioral Area: None, normal Jaw: None, normal Tongue: None, normal,Extremity Movements Upper (arms, wrists, hands, fingers): None, normal Lower (legs, knees, ankles, toes): None, normal, Trunk Movements Neck, shoulders, hips: None, normal, Overall Severity Severity of abnormal movements (highest score from questions above): None, normal Incapacitation due to abnormal movements: None, normal Patient's awareness of abnormal movements (rate only patient's report): No Awareness, Dental Status Current problems with teeth and/or dentures?: No Does patient usually wear dentures?: No  CIWA:  CIWA-Ar Total: 0 COWS:  COWS Total Score: 3  Musculoskeletal: Strength & Muscle Tone: within normal limits Gait & Station: normal Patient leans: N/A  Psychiatric Specialty Exam: Physical Exam  Nursing note and vitals reviewed. Constitutional: He is oriented to person, place, and time. He appears well-developed and well-nourished.  Respiratory: Effort normal.  Musculoskeletal:        General: Normal range of motion.  Neurological: He is alert and oriented to person, place, and time.    Review of Systems  Constitutional: Negative.   Respiratory: Negative for cough and shortness of  breath.   Psychiatric/Behavioral: Negative for agitation, behavioral problems, confusion, dysphoric mood, hallucinations, self-injury, sleep disturbance and suicidal ideas. The patient is not nervous/anxious and is not hyperactive.     Blood pressure 129/82, pulse (!) 106, temperature 98 F (36.7 C), temperature source Oral, resp. rate 16, height 5\' 3"  (1.6 m), weight 65.3 kg, SpO2 94 %.Body mass index is 25.51 kg/m.  See MD's discharge SRA    Have you used any form of tobacco in the last 30 days? (Cigarettes, Smokeless Tobacco, Cigars, and/or Pipes): Yes  Has this patient used any form of tobacco in the last 30 days? (Cigarettes, Smokeless Tobacco, Cigars, and/or Pipes) Yes, a prescription for an FDA-approved medication for tobacco cessation was offered at discharge.   Blood Alcohol level:  Lab Results  Component Value Date   ETH 274 (H) 08/01/2019   ETH 119 (H) 99991111    Metabolic Disorder Labs:  Lab Results  Component Value Date   HGBA1C 5.4 08/05/2019   MPG 108.28 08/05/2019   MPG 111.15 04/01/2019   No results found for: PROLACTIN Lab Results  Component Value Date   CHOL 161 08/05/2019   TRIG 78 08/05/2019   HDL 66 08/05/2019   CHOLHDL 2.4 08/05/2019   VLDL 16 08/05/2019   LDLCALC 79 08/05/2019   LDLCALC 134 (H) 04/01/2019    See Psychiatric Specialty Exam and Suicide Risk Assessment completed by Attending Physician prior to discharge.  Discharge destination:  Home  Is patient on multiple antipsychotic therapies at discharge:  No   Has Patient had three or more failed trials of antipsychotic monotherapy by history:  No  Recommended Plan for Multiple Antipsychotic Therapies: NA  Discharge Instructions    Discharge instructions   Complete by: As directed    Patient is instructed to take all prescribed medications as recommended. Report any side effects or adverse reactions to your outpatient psychiatrist. Patient is instructed to abstain from alcohol and  illegal drugs while on prescription medications. In the event of worsening symptoms, patient is instructed to call the crisis hotline, 911, or go to the nearest emergency department for evaluation and treatment.     Allergies as of 08/08/2019      Reactions   Bee Venom Anaphylaxis   Penicillins Rash  Has patient had a PCN reaction causing immediate rash, facial/tongue/throat swelling, SOB or lightheadedness with hypotension: {Yes Has patient had a PCN reaction causing severe rash involving mucus membranes or skin necrosis: YES Has patient had a PCN reaction that required hospitalization Yes Has patient had a PCN reaction occurring within the last 10 years: YES If all of the above answers are "NO", then may proceed with Cephalosporin use.   Vancomycin Tinitus   Pt states he "almost went deaf" - does not want to take vanc      Medication List    STOP taking these medications   budesonide-formoterol 160-4.5 MCG/ACT inhaler Commonly known as: SYMBICORT Replaced by: fluticasone furoate-vilanterol 200-25 MCG/INH Aepb   hydrOXYzine 25 MG tablet Commonly known as: ATARAX/VISTARIL     TAKE these medications     Indication  albuterol 108 (90 Base) MCG/ACT inhaler Commonly known as: VENTOLIN HFA Inhale 1-2 puffs into the lungs every 6 (six) hours as needed for wheezing or shortness of breath. What changed:   how much to take  when to take this  Indication: Chronic Obstructive Lung Disease   amLODipine 5 MG tablet Commonly known as: NORVASC Take 1 tablet (5 mg total) by mouth daily.  Indication: High Blood Pressure Disorder   fluticasone furoate-vilanterol 200-25 MCG/INH Aepb Commonly known as: BREO ELLIPTA Inhale 1 puff into the lungs daily. Start taking on: August 09, 2019 Replaces: budesonide-formoterol 160-4.5 MCG/ACT inhaler  Indication: Asthma   folic acid 1 MG tablet Commonly known as: FOLVITE Take 1 tablet (1 mg total) by mouth daily.  Indication: Supplementation    gabapentin 300 MG capsule Commonly known as: NEURONTIN Take 1 capsule (300 mg total) by mouth 3 (three) times daily.  Indication: Abuse or Misuse of Alcohol, Alcohol Withdrawal Syndrome   guaiFENesin-dextromethorphan 100-10 MG/5ML syrup Commonly known as: ROBITUSSIN DM Take 5 mLs by mouth every 4 (four) hours as needed for cough (chest congestion).  Indication: Cough   multivitamin with minerals Tabs tablet Take 1 tablet by mouth daily.  Indication: Nutritional Support   nicotine 21 mg/24hr patch Commonly known as: NICODERM CQ - dosed in mg/24 hours Place 1 patch (21 mg total) onto the skin daily.  Indication: Nicotine Addiction   pantoprazole 40 MG tablet Commonly known as: PROTONIX Take 1 tablet (40 mg total) by mouth 2 (two) times daily.  Indication: Gastroesophageal Reflux Disease   sertraline 50 MG tablet Commonly known as: ZOLOFT Take 1 tablet (50 mg total) by mouth daily. Start taking on: August 09, 2019 What changed:   medication strength  how much to take  Indication: Generalized Anxiety Disorder, Major Depressive Disorder   thiamine 100 MG tablet Take 1 tablet (100 mg total) by mouth daily.  Indication: Supplementation   traZODone 150 MG tablet Commonly known as: DESYREL Take 1 tablet (150 mg total) by mouth at bedtime as needed for sleep. What changed:   medication strength  how much to take  Indication: Trouble Sleeping, Major Depressive Disorder      Follow-up [place] to.   Specialty: Professional Counselor Why: Please follow up with clinic for outpatient services during walk-in hours; Monday-Friday 8:30a.-12:00p and 1:00p-2:30p. Be sure to bring the following; photo ID, insurance card, SSN, current medications and discharge paperwork from this hospitalization. Contact information: [facility] [place] [address] [phone]        [facility] Follow up.   Why:  This organization provides free grief counseling.  Please feel free to contact them directly to schedule an appointment. Contact information: [address] [facility] Follow up.   Why: Primary care clinic for low-income/uninsured. Please call to schedule appointment for primary care. Contact information: [address] [place] [phone]          Follow-up recommendations: Activity as tolerated. Diet as recommended by primary care physician. Keep all scheduled follow-up appointments as recommended.   Comments:   Patient is instructed to take all prescribed medications as recommended. Report any side effects or adverse reactions to your outpatient psychiatrist. Patient is instructed to abstain from alcohol and illegal drugs while on prescription medications. In the event of worsening symptoms, patient is instructed to call the crisis hotline, 911, or go to the nearest emergency department for evaluation and treatment.  Signed: Connye Burkitt, NP 08/08/2019, 4:27 PM   Patient seen, Suicide Assessment Completed.  Disposition Plan Reviewed

## 2019-08-08 NOTE — Progress Notes (Signed)
Adult Psychoeducational Group Note  Date:  08/08/2019 Time:  12:05 AM  Group Topic/Focus:  Wrap-Up Group:   The focus of this group is to help patients review their daily goal of treatment and discuss progress on daily workbooks.  Participation Level:  Minimal  Participation Quality:  Appropriate  Affect:  Appropriate  Cognitive:  Appropriate  Insight: Appropriate  Engagement in Group:  Limited  Modes of Intervention:  Limit-setting  Additional Comments:  Pt attend wrap up group. His day was a 7. His goal to talk to the doctor to be release. He meet his goal. His coping skills deep breathing exercise.  Lenice Llamas Long 08/08/2019, 12:05 AM

## 2019-08-08 NOTE — Progress Notes (Addendum)
   08/08/19 0900  Psych Admission Type (Psych Patients Only)  Admission Status Voluntary  Psychosocial Assessment  Patient Complaints None  Eye Contact Fair  Facial Expression Anxious  Affect Appropriate to circumstance  Speech Logical/coherent  Interaction Assertive  Motor Activity Slow  Appearance/Hygiene In scrubs  Behavior Characteristics Cooperative;Appropriate to situation  Mood Anxious;Pleasant  Aggressive Behavior  Targets Self  Type of Behavior Verbal  Effect No apparent injury  Thought Process  Coherency WDL  Content WDL  Delusions None reported or observed  Perception WDL  Hallucination None reported or observed  Judgment WDL  Confusion None  Danger to Self  Current suicidal ideation? Denies  Self-Injurious Behavior No self-injurious ideation or behavior indicators observed or expressed   Agreement Not to Harm Self Yes  Description of Agreement verbal  Danger to Others  Danger to Others None reported or observed    D. Patient is friendly upon approach- reports that he is looking forward to discharge today- writes that his most important goal today is "going home and doing better" and reports that " better thinking" will help him meet that goal. Per pt's self inventory, pt rated his depression, hopelessness and anxiety a 5/6/6/, respectively. Pt denies SI/HI and A/V hallucinations.  A. Labs and vitals monitored. Pt given and educated on medications. Pt supported emotionally and encouraged to express concerns and ask questions.   R. Pt remains safe with 15 minute checks. Will continue POC.

## 2019-08-08 NOTE — Progress Notes (Signed)
Pt discharged to lobby. Pt was stable and appreciative at that time. All papers and prescriptions were given and valuables returned. Verbal understanding expressed. Denies SI/HI and A/VH. Pt given opportunity to express concerns and ask questions.  

## 2019-08-08 NOTE — [facility] Group Notes (Addendum)
LCSW Group Therapy Note  08/08/2019   10:00-11:00am   Type of Therapy and Topic:  Group Therapy: Anger Cues and Responses  Participation Level:  Active   Description of Group:   In this group, patients learned how to recognize the physical, cognitive, emotional, and behavioral responses they have to anger-provoking situations.  They identified a recent time they became angry and how they reacted.  They analyzed how their reaction was possibly beneficial and how it was possibly unhelpful.  The group discussed a variety of healthier coping skills that could help with such a situation in the future.  Focus was placed on how helpful it is to recognize the underlying emotions to our anger, because working on those can lead to a more permanent solution as well as our ability to focus on the important rather than the urgent.  Therapeutic Goals: 1. Patients will remember their last incident of anger and how they felt emotionally and physically, what their thoughts were at the time, and how they behaved. 2. Patients will identify how their behavior at that time worked for them, as well as how it worked against them. 3. Patients will explore possible new behaviors to use in future anger situations. 4. Patients will learn that anger itself is normal and cannot be eliminated, and that healthier reactions can assist with resolving conflict rather than worsening situations.  Summary of Patient Progress:  The patient shared that his most recent time of anger was about 5 months when he got very upset because a young man kept telling him "I know how you feel" about the loss of his two children and wife.  He stated his feeling was that he "wanted to hurt himt" but he just told the man instead to walk away.  The patient was very active throughout group, interacting with others and providing insightful comments.  Therapeutic Modalities:   Cognitive Behavioral Therapy  Maretta Los

## 2019-09-13 ENCOUNTER — Encounter (HOSPITAL_COMMUNITY): Payer: Self-pay | Admitting: Family Medicine

## 2019-09-13 ENCOUNTER — Inpatient Hospital Stay (HOSPITAL_COMMUNITY): Payer: Medicaid Other

## 2019-09-13 ENCOUNTER — Other Ambulatory Visit: Payer: Self-pay

## 2019-09-13 ENCOUNTER — Inpatient Hospital Stay (HOSPITAL_COMMUNITY)
Admission: EM | Admit: 2019-09-13 | Discharge: 2019-09-23 | DRG: 897 | Disposition: A | Discharge status: Skilled Nursing Facility | Payer: Medicaid Other | Attending: Internal Medicine | Admitting: Internal Medicine

## 2019-09-13 DIAGNOSIS — Y908 Blood alcohol level of 240 mg/100 ml or more: Secondary | ICD-10-CM | POA: Diagnosis present

## 2019-09-13 DIAGNOSIS — Z5900 Homelessness unspecified: Secondary | ICD-10-CM

## 2019-09-13 DIAGNOSIS — I2510 Atherosclerotic heart disease of native coronary artery without angina pectoris: Secondary | ICD-10-CM | POA: Diagnosis present

## 2019-09-13 DIAGNOSIS — F1010 Alcohol abuse, uncomplicated: Secondary | ICD-10-CM

## 2019-09-13 DIAGNOSIS — T699XXA Effect of reduced temperature, unspecified, initial encounter: Secondary | ICD-10-CM | POA: Diagnosis present

## 2019-09-13 DIAGNOSIS — Z20822 Contact with and (suspected) exposure to covid-19: Secondary | ICD-10-CM | POA: Diagnosis present

## 2019-09-13 DIAGNOSIS — F10920 Alcohol use, unspecified with intoxication, uncomplicated: Secondary | ICD-10-CM

## 2019-09-13 DIAGNOSIS — T17908A Unspecified foreign body in respiratory tract, part unspecified causing other injury, initial encounter: Secondary | ICD-10-CM

## 2019-09-13 DIAGNOSIS — F1020 Alcohol dependence, uncomplicated: Secondary | ICD-10-CM | POA: Diagnosis present

## 2019-09-13 DIAGNOSIS — J441 Chronic obstructive pulmonary disease with (acute) exacerbation: Secondary | ICD-10-CM | POA: Diagnosis present

## 2019-09-13 DIAGNOSIS — F10229 Alcohol dependence with intoxication, unspecified: Principal | ICD-10-CM | POA: Diagnosis present

## 2019-09-13 DIAGNOSIS — T68XXXA Hypothermia, initial encounter: Secondary | ICD-10-CM | POA: Diagnosis present

## 2019-09-13 DIAGNOSIS — K219 Gastro-esophageal reflux disease without esophagitis: Secondary | ICD-10-CM | POA: Diagnosis present

## 2019-09-13 DIAGNOSIS — R531 Weakness: Secondary | ICD-10-CM | POA: Diagnosis present

## 2019-09-13 DIAGNOSIS — T380X5A Adverse effect of glucocorticoids and synthetic analogues, initial encounter: Secondary | ICD-10-CM | POA: Diagnosis not present

## 2019-09-13 DIAGNOSIS — Z79899 Other long term (current) drug therapy: Secondary | ICD-10-CM

## 2019-09-13 DIAGNOSIS — Z8673 Personal history of transient ischemic attack (TIA), and cerebral infarction without residual deficits: Secondary | ICD-10-CM | POA: Diagnosis not present

## 2019-09-13 DIAGNOSIS — F10239 Alcohol dependence with withdrawal, unspecified: Secondary | ICD-10-CM | POA: Diagnosis not present

## 2019-09-13 DIAGNOSIS — Z818 Family history of other mental and behavioral disorders: Secondary | ICD-10-CM

## 2019-09-13 DIAGNOSIS — I959 Hypotension, unspecified: Secondary | ICD-10-CM | POA: Diagnosis present

## 2019-09-13 DIAGNOSIS — F17210 Nicotine dependence, cigarettes, uncomplicated: Secondary | ICD-10-CM | POA: Diagnosis present

## 2019-09-13 DIAGNOSIS — R262 Difficulty in walking, not elsewhere classified: Secondary | ICD-10-CM | POA: Diagnosis present

## 2019-09-13 DIAGNOSIS — Z7951 Long term (current) use of inhaled steroids: Secondary | ICD-10-CM

## 2019-09-13 DIAGNOSIS — F419 Anxiety disorder, unspecified: Secondary | ICD-10-CM | POA: Diagnosis not present

## 2019-09-13 DIAGNOSIS — G8929 Other chronic pain: Secondary | ICD-10-CM | POA: Diagnosis present

## 2019-09-13 DIAGNOSIS — Z720 Tobacco use: Secondary | ICD-10-CM | POA: Diagnosis present

## 2019-09-13 DIAGNOSIS — I252 Old myocardial infarction: Secondary | ICD-10-CM

## 2019-09-13 DIAGNOSIS — F1014 Alcohol abuse with alcohol-induced mood disorder: Secondary | ICD-10-CM | POA: Diagnosis not present

## 2019-09-13 DIAGNOSIS — R519 Headache, unspecified: Secondary | ICD-10-CM | POA: Diagnosis not present

## 2019-09-13 DIAGNOSIS — R45851 Suicidal ideations: Secondary | ICD-10-CM | POA: Diagnosis present

## 2019-09-13 DIAGNOSIS — F10929 Alcohol use, unspecified with intoxication, unspecified: Secondary | ICD-10-CM | POA: Diagnosis not present

## 2019-09-13 DIAGNOSIS — I10 Essential (primary) hypertension: Secondary | ICD-10-CM | POA: Diagnosis present

## 2019-09-13 DIAGNOSIS — F331 Major depressive disorder, recurrent, moderate: Secondary | ICD-10-CM | POA: Diagnosis present

## 2019-09-13 DIAGNOSIS — J449 Chronic obstructive pulmonary disease, unspecified: Secondary | ICD-10-CM | POA: Diagnosis present

## 2019-09-13 DIAGNOSIS — Z801 Family history of malignant neoplasm of trachea, bronchus and lung: Secondary | ICD-10-CM

## 2019-09-13 DIAGNOSIS — D72828 Other elevated white blood cell count: Secondary | ICD-10-CM | POA: Diagnosis not present

## 2019-09-13 DIAGNOSIS — R109 Unspecified abdominal pain: Secondary | ICD-10-CM

## 2019-09-13 DIAGNOSIS — R079 Chest pain, unspecified: Secondary | ICD-10-CM | POA: Diagnosis not present

## 2019-09-13 DIAGNOSIS — X31XXXA Exposure to excessive natural cold, initial encounter: Secondary | ICD-10-CM | POA: Diagnosis not present

## 2019-09-13 DIAGNOSIS — K2270 Barrett's esophagus without dysplasia: Secondary | ICD-10-CM | POA: Diagnosis present

## 2019-09-13 DIAGNOSIS — Z881 Allergy status to other antibiotic agents status: Secondary | ICD-10-CM

## 2019-09-13 DIAGNOSIS — R509 Fever, unspecified: Secondary | ICD-10-CM

## 2019-09-13 DIAGNOSIS — Z823 Family history of stroke: Secondary | ICD-10-CM

## 2019-09-13 DIAGNOSIS — Z880 Allergy status to penicillin: Secondary | ICD-10-CM

## 2019-09-13 DIAGNOSIS — Z825 Family history of asthma and other chronic lower respiratory diseases: Secondary | ICD-10-CM

## 2019-09-13 LAB — COMPREHENSIVE METABOLIC PANEL
ALT: 66 U/L — ABNORMAL HIGH (ref 0–44)
AST: 87 U/L — ABNORMAL HIGH (ref 15–41)
Albumin: 3.6 g/dL (ref 3.5–5.0)
Alkaline Phosphatase: 126 U/L (ref 38–126)
Anion gap: 13 (ref 5–15)
BUN: 7 mg/dL — ABNORMAL LOW (ref 8–23)
CO2: 26 mmol/L (ref 22–32)
Calcium: 8.9 mg/dL (ref 8.9–10.3)
Chloride: 101 mmol/L (ref 98–111)
Creatinine, Ser: 0.62 mg/dL (ref 0.61–1.24)
GFR calc Af Amer: >60 mL/min (ref >60)
GFR calc non Af Amer: >60 mL/min (ref >60)
Glucose, Bld: 125 mg/dL — ABNORMAL HIGH (ref 70–99)
Potassium: 3.7 mmol/L (ref 3.5–5.1)
Sodium: 140 mmol/L (ref 135–145)
Total Bilirubin: 0.5 mg/dL (ref 0.3–1.2)
Total Protein: 7.7 g/dL (ref 6.5–8.1)

## 2019-09-13 LAB — CBC WITH DIFFERENTIAL/PLATELET
Abs Immature Granulocytes: 0.08 10*3/uL — ABNORMAL HIGH (ref 0.00–0.07)
Basophils Absolute: 0 10*3/uL (ref 0.0–0.1)
Basophils Relative: 1 %
Eosinophils Absolute: 0 10*3/uL (ref 0.0–0.5)
Eosinophils Relative: 0 %
HCT: 43.6 % (ref 39.0–52.0)
Hemoglobin: 13.9 g/dL (ref 13.0–17.0)
Immature Granulocytes: 1 %
Lymphocytes Relative: 18 %
Lymphs Abs: 1 10*3/uL (ref 0.7–4.0)
MCH: 27.3 pg (ref 26.0–34.0)
MCHC: 31.9 g/dL (ref 30.0–36.0)
MCV: 85.5 fL (ref 80.0–100.0)
Monocytes Absolute: 1 10*3/uL (ref 0.1–1.0)
Monocytes Relative: 18 %
Neutro Abs: 3.5 10*3/uL (ref 1.7–7.7)
Neutrophils Relative %: 62 %
Platelets: 242 10*3/uL (ref 150–400)
RBC: 5.1 MIL/uL (ref 4.22–5.81)
RDW: 20.3 % — ABNORMAL HIGH (ref 11.5–15.5)
WBC: 5.7 10*3/uL (ref 4.0–10.5)
nRBC: 0 % (ref 0.0–0.2)

## 2019-09-13 LAB — CBC
HCT: 31.5 % — ABNORMAL LOW (ref 39.0–52.0)
Hemoglobin: 9.9 g/dL — ABNORMAL LOW (ref 13.0–17.0)
MCH: 27 pg (ref 26.0–34.0)
MCHC: 31.4 g/dL (ref 30.0–36.0)
MCV: 86.1 fL (ref 80.0–100.0)
Platelets: 204 10*3/uL (ref 150–400)
RBC: 3.66 MIL/uL — ABNORMAL LOW (ref 4.22–5.81)
RDW: 20.3 % — ABNORMAL HIGH (ref 11.5–15.5)
WBC: 4.3 10*3/uL (ref 4.0–10.5)
nRBC: 0 % (ref 0.0–0.2)

## 2019-09-13 LAB — BASIC METABOLIC PANEL
Anion gap: 10 (ref 5–15)
BUN: 7 mg/dL — ABNORMAL LOW (ref 8–23)
CO2: 20 mmol/L — ABNORMAL LOW (ref 22–32)
Calcium: 7 mg/dL — ABNORMAL LOW (ref 8.9–10.3)
Chloride: 108 mmol/L (ref 98–111)
Creatinine, Ser: 0.62 mg/dL (ref 0.61–1.24)
GFR calc Af Amer: >60 mL/min (ref >60)
GFR calc non Af Amer: >60 mL/min (ref >60)
Glucose, Bld: 94 mg/dL (ref 70–99)
Potassium: 3.3 mmol/L — ABNORMAL LOW (ref 3.5–5.1)
Sodium: 138 mmol/L (ref 135–145)

## 2019-09-13 LAB — URINALYSIS, ROUTINE W REFLEX MICROSCOPIC
Bilirubin Urine: NEGATIVE
Glucose, UA: 50 mg/dL — AB
Hgb urine dipstick: NEGATIVE
Ketones, ur: NEGATIVE mg/dL
Leukocytes,Ua: NEGATIVE
Nitrite: NEGATIVE
Protein, ur: >=300 mg/dL — AB
Specific Gravity, Urine: 1.005 (ref 1.005–1.030)
pH: 6 (ref 5.0–8.0)

## 2019-09-13 LAB — SARS CORONAVIRUS 2 (TAT 6-24 HRS): SARS Coronavirus 2: NEGATIVE

## 2019-09-13 LAB — ETHANOL: Alcohol, Ethyl (B): 327 mg/dL (ref <10)

## 2019-09-13 LAB — LACTIC ACID, PLASMA
Lactic Acid, Venous: 2.8 mmol/L (ref 0.5–1.9)
Lactic Acid, Venous: 1.5 mmol/L (ref 0.5–1.9)

## 2019-09-13 LAB — MRSA PCR SCREENING: MRSA by PCR: NEGATIVE

## 2019-09-13 LAB — CREATININE, SERUM
Creatinine, Ser: 0.54 mg/dL — ABNORMAL LOW (ref 0.61–1.24)
GFR calc Af Amer: >60 mL/min (ref >60)
GFR calc non Af Amer: >60 mL/min (ref >60)

## 2019-09-13 MED ORDER — FOLIC ACID 1 MG PO TABS
1.0000 mg | ORAL_TABLET | Freq: Every day | ORAL | Status: DC
  Administered 2019-09-13 – 2019-09-23 (×11): 1 mg via ORAL
  Filled 2019-09-13 (×11): qty 1

## 2019-09-13 MED ORDER — THIAMINE HCL 100 MG PO TABS: 100.0000 mg | ORAL_TABLET | Freq: Every day | ORAL | Status: DC

## 2019-09-13 MED ORDER — FLUTICASONE FUROATE-VILANTEROL 200-25 MCG/INH IN AEPB
1.0000 | INHALATION_SPRAY | Freq: Every day | RESPIRATORY_TRACT | Status: DC
  Administered 2019-09-14 – 2019-09-23 (×10): 1 via RESPIRATORY_TRACT
  Filled 2019-09-13 (×2): qty 28

## 2019-09-13 MED ORDER — ALBUTEROL SULFATE (2.5 MG/3ML) 0.083% IN NEBU: 2.5000 mg | INHALATION_SOLUTION | Freq: Four times a day (QID) | RESPIRATORY_TRACT | Status: DC | PRN

## 2019-09-13 MED ORDER — PANTOPRAZOLE SODIUM 40 MG PO TBEC
40.0000 mg | DELAYED_RELEASE_TABLET | Freq: Two times a day (BID) | ORAL | Status: DC
  Administered 2019-09-13 – 2019-09-23 (×21): 40 mg via ORAL
  Filled 2019-09-13 (×21): qty 1

## 2019-09-13 MED ORDER — THIAMINE HCL 100 MG/ML IJ SOLN
100.0000 mg | Freq: Every day | INTRAMUSCULAR | Status: DC
  Filled 2019-09-13: qty 2

## 2019-09-13 MED ORDER — SODIUM CHLORIDE 0.9 % IV BOLUS
1000.0000 mL | Freq: Once | INTRAVENOUS | Status: AC
  Administered 2019-09-13: 08:00:00 1000 mL via INTRAVENOUS

## 2019-09-13 MED ORDER — NICOTINE 21 MG/24HR TD PT24
21.0000 mg | MEDICATED_PATCH | Freq: Every day | TRANSDERMAL | Status: DC
  Administered 2019-09-13 – 2019-09-23 (×11): 21 mg via TRANSDERMAL
  Filled 2019-09-13 (×11): qty 1

## 2019-09-13 MED ORDER — SENNOSIDES-DOCUSATE SODIUM 8.6-50 MG PO TABS: 1.0000 | ORAL_TABLET | Freq: Every evening | ORAL | Status: DC | PRN

## 2019-09-13 MED ORDER — THIAMINE HCL 100 MG/ML IJ SOLN
Freq: Once | INTRAVENOUS | Status: AC
  Filled 2019-09-13: qty 1000

## 2019-09-13 MED ORDER — IBUPROFEN 200 MG PO TABS
600.0000 mg | ORAL_TABLET | Freq: Four times a day (QID) | ORAL | Status: DC | PRN
  Administered 2019-09-14 – 2019-09-23 (×16): 600 mg via ORAL
  Filled 2019-09-13 (×18): qty 3

## 2019-09-13 MED ORDER — THIAMINE HCL 100 MG PO TABS
100.0000 mg | ORAL_TABLET | Freq: Every day | ORAL | Status: DC
  Administered 2019-09-13 – 2019-09-23 (×11): 100 mg via ORAL
  Filled 2019-09-13 (×11): qty 1

## 2019-09-13 MED ORDER — ADULT MULTIVITAMIN W/MINERALS CH
1.0000 | ORAL_TABLET | Freq: Every day | ORAL | Status: DC
  Administered 2019-09-13 – 2019-09-23 (×11): 1 via ORAL
  Filled 2019-09-13 (×11): qty 1

## 2019-09-13 MED ORDER — ALBUTEROL SULFATE HFA 108 (90 BASE) MCG/ACT IN AERS: 1.0000 | INHALATION_SPRAY | Freq: Four times a day (QID) | RESPIRATORY_TRACT | Status: DC | PRN

## 2019-09-13 MED ORDER — LORAZEPAM 2 MG/ML IJ SOLN: 0.0000 mg | Freq: Two times a day (BID) | INTRAMUSCULAR | Status: DC

## 2019-09-13 MED ORDER — ENOXAPARIN SODIUM 40 MG/0.4ML ~~LOC~~ SOLN
40.0000 mg | SUBCUTANEOUS | Status: DC
  Administered 2019-09-13 – 2019-09-23 (×11): 40 mg via SUBCUTANEOUS
  Filled 2019-09-13 (×11): qty 0.4

## 2019-09-13 MED ORDER — SODIUM CHLORIDE 0.9 % IV SOLN: INTRAVENOUS | Status: DC

## 2019-09-13 MED ORDER — GABAPENTIN 300 MG PO CAPS
300.0000 mg | ORAL_CAPSULE | Freq: Three times a day (TID) | ORAL | Status: DC
  Administered 2019-09-13 – 2019-09-23 (×31): 300 mg via ORAL
  Filled 2019-09-13 (×31): qty 1

## 2019-09-13 MED ORDER — LORAZEPAM 1 MG PO TABS
0.0000 mg | ORAL_TABLET | Freq: Four times a day (QID) | ORAL | Status: AC
  Administered 2019-09-13 (×3): 2 mg via ORAL
  Administered 2019-09-14: 1 mg via ORAL
  Administered 2019-09-14: 2 mg via ORAL
  Filled 2019-09-13 (×3): qty 2
  Filled 2019-09-13: qty 1
  Filled 2019-09-13: qty 4

## 2019-09-13 MED ORDER — LORAZEPAM 1 MG PO TABS: 0.0000 mg | ORAL_TABLET | Freq: Two times a day (BID) | ORAL | Status: DC

## 2019-09-13 MED ORDER — POTASSIUM CHLORIDE CRYS ER 20 MEQ PO TBCR
20.0000 meq | EXTENDED_RELEASE_TABLET | Freq: Two times a day (BID) | ORAL | Status: AC
  Administered 2019-09-13 – 2019-09-14 (×3): 20 meq via ORAL
  Filled 2019-09-13 (×3): qty 1

## 2019-09-13 MED ORDER — SODIUM CHLORIDE 0.9 % IV BOLUS
1000.0000 mL | Freq: Once | INTRAVENOUS | Status: AC
  Administered 2019-09-13: 1000 mL via INTRAVENOUS

## 2019-09-13 MED ORDER — CYCLOBENZAPRINE HCL 10 MG PO TABS
10.0000 mg | ORAL_TABLET | Freq: Three times a day (TID) | ORAL | Status: DC | PRN
  Administered 2019-09-13 – 2019-09-20 (×7): 10 mg via ORAL
  Filled 2019-09-13 (×7): qty 1

## 2019-09-13 MED ORDER — CHLORHEXIDINE GLUCONATE CLOTH 2 % EX PADS
6.0000 | MEDICATED_PAD | Freq: Every day | CUTANEOUS | Status: DC
  Administered 2019-09-14 – 2019-09-23 (×8): 6 via TOPICAL

## 2019-09-13 MED ORDER — LORAZEPAM 2 MG/ML IJ SOLN: 0.0000 mg | Freq: Four times a day (QID) | INTRAMUSCULAR | Status: AC

## 2019-09-13 MED ORDER — SERTRALINE HCL 50 MG PO TABS
50.0000 mg | ORAL_TABLET | Freq: Every day | ORAL | Status: DC
  Administered 2019-09-13 – 2019-09-14 (×2): 50 mg via ORAL
  Filled 2019-09-13 (×2): qty 1

## 2019-09-13 MED ORDER — ZOLPIDEM TARTRATE 5 MG PO TABS
5.0000 mg | ORAL_TABLET | Freq: Every evening | ORAL | Status: DC | PRN
  Administered 2019-09-15: 5 mg via ORAL
  Filled 2019-09-13: qty 1

## 2019-09-13 MED ORDER — THIAMINE HCL 100 MG/ML IJ SOLN: 100.0000 mg | Freq: Every day | INTRAMUSCULAR | Status: DC

## 2019-09-13 MED ORDER — SODIUM CHLORIDE 0.9 % IV SOLN: Freq: Once | INTRAVENOUS | Status: AC

## 2019-09-13 NOTE — ED Triage Notes (Signed)
Pt comes to ed via ems, c/o : ETOH pt found in a yard , homeless, pt verbalizes he been drinking. Pt has a hx of HTN and seizure, v/s on arrival hr 80, bp 110 pap, cbg 139, rr20, spo2 99. Pt can not control his bladder or bowels.

## 2019-09-13 NOTE — ED Provider Notes (Signed)
[facility] DEPT Provider Note   CSN: [id number] Arrival date & time: 09/13/19  [id number]     History Chief Complaint  Patient presents with  . Alcohol Intoxication    Noxx Reeck is a 64 y.o. male.  Per EMS the patient was found near [place] in a yard, admits to homelessness and alcohol intoxication. He reports he feels sick, "like I don't want to live anymore." He reports being cold. He states he cannot control his bowel or bladder but does not know when this started. No abdominal pain, back pain/injury or fall. Per chart review the patient has a history of COPD, GERD, CVA, depression, HTN, CAD, alcoholism with h/o DT's, Barrett's esophagus, homelessness. He denies vomiting, SOB, or chest pain.  The history is provided by the patient and the EMS personnel. No language interpreter was used.  Alcohol Intoxication Pertinent negatives include no chest pain, no abdominal pain and no shortness of breath.       Past Medical History:  Diagnosis Date  . Alcoholism ([place])   . Barrett's esophagus   . CAD (coronary artery disease) 2002   MI, no intervention required  . COPD (chronic obstructive pulmonary disease) (HCC)    not on home O2  . Depression   . DTs (delirium tremens) ([place])   . GERD (gastroesophageal reflux disease)   . Headache   . Homeless   . Hypertension   . Stroke [facility]) 2006    Patient Active Problem List   Diagnosis Date Noted  . MDD (major depressive disorder) 08/04/2019  . High anion gap metabolic acidosis 99991111  . Hypothermia 08/01/2019  . Pressure injury of skin 03/25/2019  . Leukocytosis 03/25/2019  . Thrombocytosis ([place]) 03/25/2019  . Sunburn 03/25/2019  . Epigastric abdominal pain 03/25/2019  . QT prolongation 09/09/2018  . Overdose 09/06/2018  . Substance induced mood disorder ([place]) 07/25/2018  . MDD (major depressive disorder), recurrent episode, severe ([place]) 07/18/2018  . MDD (major depressive disorder),  recurrent severe, without psychosis ([place]) 08/26/2017  . Alcohol withdrawal ([place]) 08/08/2017  . COPD with acute bronchitis ([place]) 07/29/2017  . Alcohol use   . Homelessness 06/24/2017  . Lung nodule 09/03/2016  . Tobacco abuse 09/03/2016  . Insomnia 09/03/2016  . HCAP (healthcare-associated pneumonia) 08/22/2016  . Bronchiolitis 08/21/2016  . Malnutrition of moderate degree 08/04/2016  . Atrial tachycardia ([place]) 08/02/2016  . Impaired glucose tolerance 08/02/2016  . Alcohol use with alcohol-induced mood disorder ([place]) 12/12/2015  . Syncope 11/15/2015  . Chest pain 11/15/2015  . Nausea and vomiting 11/15/2015  . Abdominal pain 11/15/2015  . Major depressive disorder, recurrent episode, moderate with anxious distress ([place]) 10/30/2015  . Alcohol use disorder, severe, dependence ([place]) 10/29/2015  . COPD exacerbation ([place]) 09/20/2015  . Acute respiratory failure with hypoxia ([place]) 09/18/2015  . Suicidal ideation 09/17/2015  . Alcohol intoxication ([place]) 09/17/2015  . Depression 09/17/2015  . Benign essential HTN 09/17/2015  . Hypokalemia 09/17/2015  . Hyponatremia 09/17/2015  . Coffee ground emesis 09/17/2015  . COPD (chronic obstructive pulmonary disease) ([place]) 02/17/2013  . GERD (gastroesophageal reflux disease) 02/17/2013    Past Surgical History:  Procedure Laterality Date  . BACK SURGERY     3 cervical spine surgeries C4-C5 fused  . COLONOSCOPY N/A 01/04/2014   Procedure: COLONOSCOPY;  Surgeon: Danie Binder, MD;  [facility];  Service: Endoscopy;  Laterality: N/A;  1:45  . ESOPHAGOGASTRODUODENOSCOPY N/A 01/04/2014   Procedure: ESOPHAGOGASTRODUODENOSCOPY (EGD);  Surgeon: Danie Binder, MD;  Location: [facility];  Service: Endoscopy;  Laterality: N/A;  . FINGER SURGERY Left    2nd, 3rd, & 4th fingers were cut off by table saw and reattached  . GASTRECTOMY    . HERNIA REPAIR    . INCISIONAL HERNIA REPAIR N/A 01/20/2014   Procedure: LAPAROSCOPIC RECURRENT   INCISIONAL HERNIA with mesh;  Surgeon: Edward Jolly, MD;  Location: [facility] ORS;  Service: General;  Laterality: N/A;  . rt knee arthroscopic surgery    . SHOULDER SURGERY Bilateral    3 surgeries on on left, 2 surgeries on right        Family History  Problem Relation Age of Onset  . Cancer Father        bone  . Cancer Brother        lungs  . Stroke Maternal Grandmother   . Asthma Son        died at age 56 in his sleep   . Spina bifida Son        died at age 27   . Dementia Mother   . Colon cancer Neg Hx     Social History   Tobacco Use  . Smoking status: Current Every Day Smoker    Packs/day: 1.00    Years: 52.00    Pack years: 52.00    Types: Cigarettes    Start date: 07/30/1966  . Smokeless tobacco: Never Used  . Tobacco comment: uses nicotine patch  Substance Use Topics  . Alcohol use: Yes    Alcohol/week: 15.0 standard drinks    Types: 15 Cans of beer per week    Comment: last drink 2 days ago  . Drug use: No    Comment: denied using any drugs    Home Medications Prior to Admission medications   Medication Sig Start Date End Date Taking? Authorizing Provider  albuterol (VENTOLIN HFA) 108 (90 Base) MCG/ACT inhaler Inhale 1-2 puffs into the lungs every 6 (six) hours as needed for wheezing or shortness of breath. 08/08/19   Connye Burkitt, NP  amLODipine (NORVASC) 5 MG tablet Take 1 tablet (5 mg total) by mouth daily. 08/08/19   Connye Burkitt, NP  fluticasone furoate-vilanterol (BREO ELLIPTA) 200-25 MCG/INH AEPB Inhale 1 puff into the lungs daily. 08/09/19   Connye Burkitt, NP  folic acid (FOLVITE) 1 MG tablet Take 1 tablet (1 mg total) by mouth daily. 08/08/19   Connye Burkitt, NP  gabapentin (NEURONTIN) 300 MG capsule Take 1 capsule (300 mg total) by mouth 3 (three) times daily. 08/08/19   Connye Burkitt, NP  guaiFENesin-dextromethorphan (ROBITUSSIN DM) 100-10 MG/5ML syrup Take 5 mLs by mouth every 4 (four) hours as needed for cough (chest congestion). 08/04/19    Earlene Plater, MD  Multiple Vitamin (MULTIVITAMIN WITH MINERALS) TABS tablet Take 1 tablet by mouth daily. Patient not taking: Reported on 06/26/2019 06/18/19   Rankin, Shuvon B, NP  nicotine (NICODERM CQ - DOSED IN MG/24 HOURS) 21 mg/24hr patch Place 1 patch (21 mg total) onto the skin daily. 08/08/19   Connye Burkitt, NP  pantoprazole (PROTONIX) 40 MG tablet Take 1 tablet (40 mg total) by mouth 2 (two) times daily. 08/08/19   Connye Burkitt, NP  sertraline (ZOLOFT) 50 MG tablet Take 1 tablet (50 mg total) by mouth daily. 08/09/19   Connye Burkitt, NP  thiamine 100 MG tablet Take 1 tablet (100 mg total) by mouth daily. 08/08/19   Connye Burkitt, NP  traZODone (  DESYREL) 150 MG tablet Take 1 tablet (150 mg total) by mouth at bedtime as needed for sleep. 08/08/19   Connye Burkitt, NP    Allergies    Bee venom, Penicillins, and Vancomycin  Review of Systems   Review of Systems  Constitutional: Negative for chills and fever.       "I'm cold"  HENT: Negative.   Respiratory: Negative.  Negative for shortness of breath.   Cardiovascular: Negative.  Negative for chest pain.  Gastrointestinal: Negative.  Negative for abdominal pain.  Musculoskeletal: Negative.   Skin: Negative.   Neurological: Negative.   Psychiatric/Behavioral: Positive for dysphoric mood.    Physical Exam Updated Vital Signs BP (!) 124/100 (BP Location: Left Arm)   Pulse 61   Temp (!) 87.6 F (30.9 C) (Rectal)   Resp 18   SpO2 98%   Physical Exam Vitals and nursing note reviewed.  Constitutional:      General: He is not in acute distress.    Appearance: He is well-developed.     Comments: Patient is awake and alert, answers all questions. Found to be hypothermic on rectal temp.  HENT:     Head: Normocephalic.  Cardiovascular:     Rate and Rhythm: Normal rate and regular rhythm.  Pulmonary:     Effort: Pulmonary effort is normal.     Breath sounds: Normal breath sounds. No wheezing, rhonchi or rales.  Abdominal:       General: Bowel sounds are normal.     Palpations: Abdomen is soft.     Tenderness: There is no abdominal tenderness. There is no guarding or rebound.  Musculoskeletal:        General: Normal range of motion.     Cervical back: Normal range of motion and neck supple.  Skin:    General: Skin is warm and dry.  Neurological:     Mental Status: He is alert and oriented to person, place, and time.     ED Results / Procedures / Treatments   Labs (all labs ordered are listed, but only abnormal results are displayed) Labs Reviewed  SARS CORONAVIRUS 2 (TAT 6-24 HRS)  CBC WITH DIFFERENTIAL/PLATELET  COMPREHENSIVE METABOLIC PANEL  ETHANOL  LACTIC ACID, PLASMA  LACTIC ACID, PLASMA    EKG None  Radiology No results found.  Procedures Procedures (including critical care time) CRITICAL CARE Performed by: Dewaine Oats   Total critical care time: 60 minutes  Critical care time was exclusive of separately billable procedures and treating other patients.  Critical care was necessary to treat or prevent imminent or life-threatening deterioration.  Critical care was time spent personally by me on the following activities: development of treatment plan with patient and/or surrogate as well as nursing, discussions with consultants, evaluation of patient's response to treatment, examination of patient, obtaining history from patient or surrogate, ordering and performing treatments and interventions, ordering and review of laboratory studies, ordering and review of radiographic studies, pulse oximetry and re-evaluation of patient's condition.  Medications Ordered in ED Medications  0.9 %  sodium chloride infusion (has no administration in time range)  LORazepam (ATIVAN) injection 0-4 mg (has no administration in time range)    Or  LORazepam (ATIVAN) tablet 0-4 mg (has no administration in time range)  LORazepam (ATIVAN) injection 0-4 mg (has no administration in time range)    Or   LORazepam (ATIVAN) tablet 0-4 mg (has no administration in time range)  sodium chloride 0.9 % 1,000 mL with thiamine 100 mg,  folic acid 1 mg, multivitamins adult 10 mL infusion (has no administration in time range)    ED Course  I have reviewed the triage vital signs and the nursing notes.  Pertinent labs & imaging results that were available during my care of the patient were reviewed by me and considered in my medical decision making (see chart for details).  Clinical Course as of Sep 12 613  Sun Sep 13, 2019  0330 Patient to ED, found outside, intoxicated, lying in a yard near [place]. Clothes are soaked. Rectal temp 87.6. Incontinent - ETOH vs hypothermia vs voluntary. Warmed fluids, bair hugger started.    [SU]    Clinical Course User Index [SU] Charlann Lange, PA-C   MDM Rules/Calculators/A&P                      Patient to ED after cold exposure, core temp 87.6. He is awake and in NAD. VS otherwise WNL. He appears acutely intoxicated but oriented. He has urinated and defecated on himself.   Bair hugger started immediately. Warmed fluids ordered, labs and COVID obtained in anticipation of admission. Once reviewed, lactic acid is 2.8, otherwise labs are stable. EKG without concerning change.   Blood pressure dropped to 78/53. Patient awake, responsive, no change in mental status. Bolus ordered. He has 2 IV's established.   Admitted to hospitalist, Dr. Humphrey Rolls accepting.   Final Clinical Impression(s) / ED Diagnoses Final diagnoses:  None   1. Hypothermia 2. Alcohol intoxication 3. Homeless  Rx / DC Orders ED Discharge Orders    None       Charlann Lange, Hershal Coria 09/13/19 0617    Merryl Hacker, MD 09/13/19 [phone]

## 2019-09-13 NOTE — ED Notes (Signed)
Pt person items and money turned into security.  Key and invoice w pt  Pt clothing in bags labeled.

## 2019-09-13 NOTE — H&P (Signed)
History and Physical    Samuel Reyes [id number] DOB: 10-16-1955 DOA: 09/13/2019   PCP: [name], NP  Patient coming from: ED via EMS, homeless  Chief Complaint: Found down  HPI: Samuel Reyes is a 64 y.o. male with medical history significant of COPD, HTN, GERD, CVA, depression, CAD, alcoholism, h/o DT's, Barrett's esophagus found down near [place] [facility] in a yard. Was wet and cold and noted to have core temp of 87 degrees. In ED treated with [facility], warmed fluids. Noted to have alcohol level of 327 and lactic acid of 2.8 without concomitant leukocytosis. Recent admission for the same  08/01/2019, with transfer to behavioral health hospital for 4 days of inpatient prior to d/c. He has known stressors including loss of child and wife and homelessness and ongoing alcohol use..   ED Course: Given Banana bag, Bear Hugger, Warmed IV fluid resuscitation, CIWA protocol  Review of Systems: As per HPI otherwise 10 point review of systems negative except as noted Feels sad, and has no reason to live. Wants to die, considering ways to end his life. Feels hungry right now.    Past Medical History:  Diagnosis Date  . Alcoholism ([place])   . Barrett's esophagus   . CAD (coronary artery disease) 2002   MI, no intervention required  . COPD (chronic obstructive pulmonary disease) (HCC)    not on home O2  . Depression   . DTs (delirium tremens) ([place])   . GERD (gastroesophageal reflux disease)   . Headache   . Homeless   . Hypertension   . Stroke [facility]) 2006    Past Surgical History:  Procedure Laterality Date  . BACK SURGERY     3 cervical spine surgeries C4-C5 fused  . COLONOSCOPY N/A 01/04/2014   Procedure: COLONOSCOPY;  Surgeon: Danie Binder, MD;  [facility];  Service: Endoscopy;  Laterality: N/A;  1:45  . ESOPHAGOGASTRODUODENOSCOPY N/A 01/04/2014   Procedure: ESOPHAGOGASTRODUODENOSCOPY (EGD);  Surgeon: Danie Binder, MD;  [facility];   Service: Endoscopy;  Laterality: N/A;  . FINGER SURGERY Left    2nd, 3rd, & 4th fingers were cut off by table saw and reattached  . GASTRECTOMY    . HERNIA REPAIR    . INCISIONAL HERNIA REPAIR N/A 01/20/2014   Procedure: LAPAROSCOPIC RECURRENT  INCISIONAL HERNIA with mesh;  Surgeon: Edward Jolly, MD;  Location: [facility]S;  Service: General;  Laterality: N/A;  . rt knee arthroscopic surgery    . SHOULDER SURGERY Bilateral    3 surgeries on on left, 2 surgeries on right      reports that he has been smoking cigarettes. He started smoking about 53 years ago. He has a 52.00 pack-year smoking history. He has never used smokeless tobacco. He reports current alcohol use of about 15.0 standard drinks of alcohol per week. He reports that he does not use drugs.  Allergies  Allergen Reactions  . Bee Venom Anaphylaxis  . Penicillins Rash    Has patient had a PCN reaction causing immediate rash, facial/tongue/throat swelling, SOB or lightheadedness with hypotension: {Yes Has patient had a PCN reaction causing severe rash involving mucus membranes or skin necrosis: YES Has patient had a PCN reaction that required hospitalization Yes Has patient had a PCN reaction occurring within the last 10 years: YES If all of the above answers are "NO", then may proceed with Cephalosporin use.   . Vancomycin Tinitus    Pt states he "almost went deaf" -  does not want to take vanc    Family History  Problem Relation Age of Onset  . Cancer Father        bone  . Cancer Brother        lungs  . Stroke Maternal Grandmother   . Asthma Son        died at age 68 in his sleep   . Spina bifida Son        died at age 44   . Dementia Mother   . Colon cancer Neg Hx      Prior to Admission medications   Medication Sig Start Date End Date Taking? Authorizing Provider  albuterol (VENTOLIN HFA) 108 (90 Base) MCG/ACT inhaler Inhale 1-2 puffs into the lungs every 6 (six) hours as needed for wheezing or shortness of  breath. 08/08/19   Connye Burkitt, NP  amLODipine (NORVASC) 5 MG tablet Take 1 tablet (5 mg total) by mouth daily. 08/08/19   Connye Burkitt, NP  fluticasone furoate-vilanterol (BREO ELLIPTA) 200-25 MCG/INH AEPB Inhale 1 puff into the lungs daily. 08/09/19   Connye Burkitt, NP  folic acid (FOLVITE) 1 MG tablet Take 1 tablet (1 mg total) by mouth daily. 08/08/19   Connye Burkitt, NP  gabapentin (NEURONTIN) 300 MG capsule Take 1 capsule (300 mg total) by mouth 3 (three) times daily. 08/08/19   Connye Burkitt, NP  guaiFENesin-dextromethorphan (ROBITUSSIN DM) 100-10 MG/5ML syrup Take 5 mLs by mouth every 4 (four) hours as needed for cough (chest congestion). 08/04/19   Earlene Plater, MD  Multiple Vitamin (MULTIVITAMIN WITH MINERALS) TABS tablet Take 1 tablet by mouth daily. Patient not taking: Reported on 06/26/2019 06/18/19   Rankin, Shuvon B, NP  nicotine (NICODERM CQ - DOSED IN MG/24 HOURS) 21 mg/24hr patch Place 1 patch (21 mg total) onto the skin daily. 08/08/19   Connye Burkitt, NP  pantoprazole (PROTONIX) 40 MG tablet Take 1 tablet (40 mg total) by mouth 2 (two) times daily. 08/08/19   Connye Burkitt, NP  sertraline (ZOLOFT) 50 MG tablet Take 1 tablet (50 mg total) by mouth daily. 08/09/19   Connye Burkitt, NP  thiamine 100 MG tablet Take 1 tablet (100 mg total) by mouth daily. 08/08/19   Connye Burkitt, NP  traZODone (DESYREL) 150 MG tablet Take 1 tablet (150 mg total) by mouth at bedtime as needed for sleep. 08/08/19   Connye Burkitt, NP    Physical Exam: Vitals:   09/13/19 0745 09/13/19 0748 09/13/19 0749 09/13/19 0800  BP: 100/70 100/70  (!) 79/55  Pulse: 70 72  75  Resp: 13   17  Temp:      TempSrc:      SpO2: 95%  95% 94%      Constitutional: NAD, calm, sleeping and arousable Vitals:   09/13/19 0745 09/13/19 0748 09/13/19 0749 09/13/19 0800  BP: 100/70 100/70  (!) 79/55  Pulse: 70 72  75  Resp: 13   17  Temp:      TempSrc:      SpO2: 95%  95% 94%   Eyes: ecchymosis over right eye    Neck: normal, supple, no masses, no thyromegaly Respiratory: clear to auscultation bilaterally, no wheezing, no crackles. Normal respiratory effort. No accessory muscle use.  Cardiovascular: Regular rate and rhythm, no murmurs / rubs / gallops. No extremity edema. 2+ pedal pulses. No carotid bruits.  Abdomen: no tenderness, no masses palpated. No hepatosplenomegaly. Bowel sounds positive.  Musculoskeletal:  no clubbing / cyanosis. [place]ber[place]'s nodes on fivgers Good ROM, no contractures. Normal muscle tone.  Skin: no rashes, lesions, ulcers. No induration, feet are warm Neurologic: Grossly normal, moves all extremeties Psychiatric: Arousable, flat affect   Labs on Admission: I have personally reviewed following labs and imaging studies  CBC: Recent Labs  Lab 09/13/19 0354  WBC 5.7  NEUTROABS 3.5  HGB 13.9  HCT 43.6  MCV 85.5  PLT [id number]   Basic Metabolic Panel: Recent Labs  Lab 09/13/19 0354  NA 140  K 3.7  CL 101  CO2 26  GLUCOSE 125*  BUN 7*  CREATININE 0.62  CALCIUM 8.9   GFR: CrCl cannot be calculated (Unknown ideal weight.). Liver Function Tests: Recent Labs  Lab 09/13/19 0354  AST 87*  ALT 66*  ALKPHOS 126  BILITOT 0.5  PROT 7.7  ALBUMIN 3.6   Urine analysis:    Component Value Date/Time   COLORURINE YELLOW 08/01/2019 1217   APPEARANCEUR CLEAR 08/01/2019 1217   LABSPEC 1.008 08/01/2019 1217   PHURINE 5.0 08/01/2019 1217   GLUCOSEU NEGATIVE 08/01/2019 1217   HGBUR NEGATIVE 08/01/2019 1217   BILIRUBINUR NEGATIVE 08/01/2019 1217   KETONESUR 5 (A) 08/01/2019 1217   PROTEINUR NEGATIVE 08/01/2019 1217   UROBILINOGEN 0.2 05/31/2015 2156   NITRITE NEGATIVE 08/01/2019 1217   LEUKOCYTESUR NEGATIVE 08/01/2019 1217   Sepsis Labs: Lactic Acid, Venous    Component Value Date/Time   LATICACIDVEN 2.8 (HH) 09/13/2019 0354    EKG: Independently reviewed.   Assessment/Plan Principal Problem:   Hypothermia Active Problems:   COPD (chronic obstructive  pulmonary disease) (HCC)   GERD (gastroesophageal reflux disease)   Alcohol intoxication ([name])   Benign essential HTN   Alcohol use disorder, severe, dependence (HCC)   Major depressive disorder, recurrent episode, moderate with anxious distress ([place])   Tobacco abuse   Homelessness   Hypothermia due to exposure  Hypothermia - due to exposure, rewarming and fluid resuscitation taking place. On Tele for now.  Alcohol intoxication - CIWA protocol. Has h/o DT's, monitor carefully. Banana bag given.  Depression/Suicidal ideation - suicide precautions, resumed Zoloft, Gabapentin, placed on these during [facility] admission in early January. Will likely need Psych eval, once awake and stable  Tobacco use disorder - placed on NIcotine patch  GERD - on PPI, continued home protonix  Homelessness - TOC consulted, recent hospitalization for same, has trouble getting meds, limited social support  HTN - hypotensive due to hypothermia. On Norvasc at home, held for now.  COPD - continue Breo and Albuterol PRN  DVT prophylaxis:  Lovenox Code Status:  Full - has been DNR, will need to reassess when more awake and interactive. Family Communication: mom Samuel Reyes [phone], she reports that they are not in contact much--advised he was admitted to [facility]. Disposition Plan: unclear, may need [facility] admission, went there following last admission Consults called: will need Psych, once more awake and alert Admission status: inpatient   Severity of Illness: The appropriate patient status for this patient is INPATIENT. Inpatient status is judged to be reasonable and necessary in order to provide the required intensity of service to ensure the patient's safety. The patient's presenting symptoms, physical exam findings, and initial radiographic and laboratory data in the context of their chronic comorbidities is felt to place them at high risk for further clinical deterioration. Furthermore, it is not anticipated that the  patient will be medically stable for discharge from the hospital within 2 midnights of admission. The following factors support the  patient status of inpatient.   " The patient's presenting symptoms include hypothermia, intoxication. " The worrisome physical exam findings include care temp of 87. " The initial radiographic and laboratory data are worrisome because of Lactic acid of 2.8. " The chronic co-morbidities include alcoholism, suicidality.   * I certify that at the point of admission it is my clinical judgment that the patient will require inpatient hospital care spanning beyond 2 midnights from the point of admission due to high intensity of service, high risk for further deterioration and high frequency of surveillance required.*    Donnamae Jude MD [facility] Pager [phone]  If 7PM-7AM, please contact night-coverage [email or web address] Password [facility]  09/13/2019, 8:17 AM

## 2019-09-13 NOTE — ED Notes (Signed)
Pt ate 75% breakfast tray
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
## 2019-09-13 NOTE — Progress Notes (Signed)
Called that patient has now developed a fever. No elevated WBC normal lactic acid at 1302. Ordered urine and blood cultures and CXR. CXR is negative. Will treat if find anything to treat. K+ is low--will replete.

## 2019-09-14 ENCOUNTER — Inpatient Hospital Stay (HOSPITAL_COMMUNITY): Payer: Medicaid Other

## 2019-09-14 DIAGNOSIS — R079 Chest pain, unspecified: Secondary | ICD-10-CM

## 2019-09-14 DIAGNOSIS — T68XXXA Hypothermia, initial encounter: Secondary | ICD-10-CM

## 2019-09-14 DIAGNOSIS — F419 Anxiety disorder, unspecified: Secondary | ICD-10-CM

## 2019-09-14 DIAGNOSIS — F1014 Alcohol abuse with alcohol-induced mood disorder: Secondary | ICD-10-CM

## 2019-09-14 LAB — CBC
HCT: 32.7 % — ABNORMAL LOW (ref 39.0–52.0)
Hemoglobin: 10 g/dL — ABNORMAL LOW (ref 13.0–17.0)
MCH: 27.1 pg (ref 26.0–34.0)
MCHC: 30.6 g/dL (ref 30.0–36.0)
MCV: 88.6 fL (ref 80.0–100.0)
Platelets: 202 10*3/uL (ref 150–400)
RBC: 3.69 MIL/uL — ABNORMAL LOW (ref 4.22–5.81)
RDW: 20.8 % — ABNORMAL HIGH (ref 11.5–15.5)
WBC: 7.2 10*3/uL (ref 4.0–10.5)
nRBC: 0 % (ref 0.0–0.2)

## 2019-09-14 LAB — URINE CULTURE
Culture: NO GROWTH
Special Requests: NORMAL

## 2019-09-14 LAB — BASIC METABOLIC PANEL
Anion gap: 7 (ref 5–15)
BUN: 8 mg/dL (ref 8–23)
CO2: 20 mmol/L — ABNORMAL LOW (ref 22–32)
Calcium: 7.2 mg/dL — ABNORMAL LOW (ref 8.9–10.3)
Chloride: 112 mmol/L — ABNORMAL HIGH (ref 98–111)
Creatinine, Ser: 0.68 mg/dL (ref 0.61–1.24)
GFR calc Af Amer: >60 mL/min (ref >60)
GFR calc non Af Amer: >60 mL/min (ref >60)
Glucose, Bld: 84 mg/dL (ref 70–99)
Potassium: 3.7 mmol/L (ref 3.5–5.1)
Sodium: 139 mmol/L (ref 135–145)

## 2019-09-14 LAB — PROTIME-INR
INR: 0.9 (ref 0.8–1.2)
Prothrombin Time: 12.4 seconds (ref 11.4–15.2)

## 2019-09-14 LAB — ECHOCARDIOGRAM COMPLETE
Height: 64 in
Weight: 2400 oz

## 2019-09-14 LAB — HEPATIC FUNCTION PANEL
ALT: 67 U/L — ABNORMAL HIGH (ref 0–44)
AST: 114 U/L — ABNORMAL HIGH (ref 15–41)
Albumin: 2.5 g/dL — ABNORMAL LOW (ref 3.5–5.0)
Alkaline Phosphatase: 83 U/L (ref 38–126)
Bilirubin, Direct: 0.1 mg/dL (ref 0.0–0.2)
Indirect Bilirubin: 0.6 mg/dL (ref 0.3–0.9)
Total Bilirubin: 0.7 mg/dL (ref 0.3–1.2)
Total Protein: 5.5 g/dL — ABNORMAL LOW (ref 6.5–8.1)

## 2019-09-14 LAB — TROPONIN I (HIGH SENSITIVITY)
Troponin I (High Sensitivity): 23 ng/L — ABNORMAL HIGH (ref <18)
Troponin I (High Sensitivity): 17 ng/L (ref <18)

## 2019-09-14 LAB — HEPATITIS PANEL, ACUTE
HCV Ab: NONREACTIVE
Hep A IgM: NONREACTIVE
Hep B C IgM: NONREACTIVE
Hepatitis B Surface Ag: NONREACTIVE

## 2019-09-14 MED ORDER — IPRATROPIUM-ALBUTEROL 0.5-2.5 (3) MG/3ML IN SOLN
3.0000 mL | Freq: Two times a day (BID) | RESPIRATORY_TRACT | Status: DC
  Administered 2019-09-15 – 2019-09-18 (×7): 3 mL via RESPIRATORY_TRACT
  Filled 2019-09-14 (×8): qty 3

## 2019-09-14 MED ORDER — BUTALBITAL-APAP-CAFFEINE 50-325-40 MG PO TABS
2.0000 | ORAL_TABLET | Freq: Once | ORAL | Status: AC | PRN
  Administered 2019-09-14: 2 via ORAL
  Filled 2019-09-14: qty 2

## 2019-09-14 MED ORDER — IPRATROPIUM-ALBUTEROL 0.5-2.5 (3) MG/3ML IN SOLN
3.0000 mL | Freq: Four times a day (QID) | RESPIRATORY_TRACT | Status: DC
  Administered 2019-09-14 (×3): 3 mL via RESPIRATORY_TRACT
  Filled 2019-09-14 (×3): qty 3

## 2019-09-14 MED ORDER — LABETALOL HCL 5 MG/ML IV SOLN
10.0000 mg | Freq: Once | INTRAVENOUS | Status: AC
  Administered 2019-09-14: 10 mg via INTRAVENOUS
  Filled 2019-09-14: qty 4

## 2019-09-14 MED ORDER — SODIUM CHLORIDE 0.9 % IV SOLN: INTRAVENOUS | Status: DC

## 2019-09-14 MED ORDER — ASPIRIN 81 MG PO CHEW
324.0000 mg | CHEWABLE_TABLET | Freq: Once | ORAL | Status: AC
  Administered 2019-09-14: 324 mg via ORAL
  Filled 2019-09-14: qty 4

## 2019-09-14 MED ORDER — FLUOXETINE HCL 20 MG PO CAPS
20.0000 mg | ORAL_CAPSULE | Freq: Every day | ORAL | Status: DC
  Administered 2019-09-14 – 2019-09-23 (×10): 20 mg via ORAL
  Filled 2019-09-14 (×10): qty 1

## 2019-09-14 MED ORDER — PREDNISONE 20 MG PO TABS
40.0000 mg | ORAL_TABLET | Freq: Every day | ORAL | Status: AC
  Administered 2019-09-14 – 2019-09-18 (×5): 40 mg via ORAL
  Filled 2019-09-14 (×5): qty 2

## 2019-09-14 MED ORDER — GUAIFENESIN-DM 100-10 MG/5ML PO SYRP
5.0000 mL | ORAL_SOLUTION | ORAL | Status: DC | PRN
  Administered 2019-09-15 – 2019-09-22 (×10): 5 mL via ORAL
  Filled 2019-09-14 (×10): qty 10

## 2019-09-14 MED ORDER — ALUM & MAG HYDROXIDE-SIMETH 200-200-20 MG/5ML PO SUSP
30.0000 mL | Freq: Once | ORAL | Status: AC
  Administered 2019-09-14: 30 mL via ORAL
  Filled 2019-09-14: qty 30

## 2019-09-14 NOTE — Progress Notes (Signed)
Reports chest pain, along with headache, tingling feet.  Pt appears comfortable.  Able to speak without SOB.  Face relaxed.  meds given and will obtain ekg.

## 2019-09-14 NOTE — Evaluation (Signed)
Clinical/Bedside Swallow Evaluation Patient Details  Name: Samuel Reyes MRN: [id number] Date of Birth: 10-May-1956  Today's Date: 09/14/2019 Time: SLP Start Time (ACUTE ONLY): 1114 SLP Stop Time (ACUTE ONLY): 1124 SLP Time Calculation (min) (ACUTE ONLY): 10 min  Past Medical History:  Past Medical History:  Diagnosis Date  . Alcoholism ([place])   . Barrett's esophagus   . CAD (coronary artery disease) 2002   MI, no intervention required  . COPD (chronic obstructive pulmonary disease) ([facility])    not on home O2  . Depression   . DTs (delirium tremens) ([place])   . GERD (gastroesophageal reflux disease)   . Headache   . Homeless   . Hypertension   . Stroke [facility]) 2006   Past Surgical History:  Past Surgical History:  Procedure Laterality Date  . BACK SURGERY     3 cervical spine surgeries C4-C5 fused  . COLONOSCOPY N/A 01/04/2014   Procedure: COLONOSCOPY;  Surgeon: Danie Binder, MD;  Location: [facility];  Service: Endoscopy;  Laterality: N/A;  1:45  . ESOPHAGOGASTRODUODENOSCOPY N/A 01/04/2014   Procedure: ESOPHAGOGASTRODUODENOSCOPY (EGD);  Surgeon: Danie Binder, MD;  Location: [facility];  Service: Endoscopy;  Laterality: N/A;  . FINGER SURGERY Left    2nd, 3rd, & 4th fingers were cut off by table saw and reattached  . GASTRECTOMY    . HERNIA REPAIR    . INCISIONAL HERNIA REPAIR N/A 01/20/2014   Procedure: LAPAROSCOPIC RECURRENT  INCISIONAL HERNIA with mesh;  Surgeon: Edward Jolly, MD;  Location: [facility];  Service: General;  Laterality: N/A;  . rt knee arthroscopic surgery    . SHOULDER SURGERY Bilateral    3 surgeries on on left, 2 surgeries on right    HPI:  Samuel Reyes is a 64 y.o. male with medical history significant of COPD, HTN, GERD, CVA, depression, CAD, alcoholism, h/o DT's, Barrett's esophagus found down near [employer] [facility] in a yard. Per chart alcohol level of 327. Chart notes recent admission for the same  08/01/2019. BSE 03/17/19 BSE + s/s aspiration. MBS  no penetration or aspiration > view of esophagus revealed barium retention in distal area, Continue reg/thin. CXR no active disease.    Assessment / Plan / Recommendation Clinical Impression  Pt exhibits an esophageal based dysphagia. He had several coughing episodes in between trials thin water and solid. Sitter and pt report he has been coughing this without po's. He is endentulous and typically does not have difficulty swallowing as he demonstrated today with graham cracker trial. Pt reports he would like pot roast for lunch reporting he has eaten before without difficulty masticating. He reported burning in chest- likely from GERD and hiatal hernia. Therapist will upgrade to regular texture therefore pt will be able to choose what he can masticate and staff can assist to order. Reviewed/educated pt re: esophageal precautions many he was already doing or familiar with. No follow up needed.  SLP Visit Diagnosis: Dysphagia, unspecified (R13.10)    Aspiration Risk  Mild aspiration risk    Diet Recommendation Regular;Thin liquid   Liquid Administration via: Cup;Straw Medication Administration: Whole meds with liquid Supervision: Patient able to self feed Compensations: Slow rate;Small sips/bites;Follow solids with liquid Postural Changes: Seated upright at 90 degrees;Remain upright for at least 30 minutes after po intake    Other  Recommendations Oral Care Recommendations: Oral care BID   Follow up Recommendations None      Frequency and Duration  Prognosis        Swallow Study   General HPI: Samuel Reyes is a 64 y.o. male with medical history significant of COPD, HTN, GERD, CVA, depression, CAD, alcoholism, h/o DT's, Barrett's esophagus found down near [employer] [facility] in a yard. Per chart alcohol level of 327. Chart notes recent admission for the same  08/01/2019. BSE 03/17/19 BSE + s/s aspiration. MBS no penetration or aspiration > view of esophagus revealed barium retention  in distal area, Continue reg/thin. CXR no active disease.  Type of Study: Bedside Swallow Evaluation Previous Swallow Assessment: (none) Diet Prior to this Study: Thin liquids;Other (Comment)(soft) Temperature Spikes Noted: No Respiratory Status: Room air History of Recent Intubation: No Behavior/Cognition: Alert;Cooperative;Pleasant mood Oral Cavity Assessment: Other (comment)(lingual candidias) Oral Care Completed by SLP: No Oral Cavity - Dentition: Edentulous Vision: Functional for self-feeding Self-Feeding Abilities: Able to feed self Patient Positioning: Upright in bed Baseline Vocal Quality: Normal Volitional Cough: Strong Volitional Swallow: Able to elicit    Oral/Motor/Sensory Function Overall Oral Motor/Sensory Function: Within functional limits   Ice Chips Ice chips: Not tested   Thin Liquid Thin Liquid: Within functional limits Presentation: Cup;Straw    Nectar Thick Nectar Thick Liquid: Not tested   Honey Thick Honey Thick Liquid: Not tested   Puree Puree: Not tested   Solid     Solid: Within functional limits      [place] Siren 09/14/2019,11:39 AM  Orbie Pyo Colvin Caroli.Ed Risk analyst [phone] Office [phone]

## 2019-09-14 NOTE — Consult Note (Signed)
[facility] Consult   Reason for Consult:  Suicidal ideations Referring Physician:  Dr Cephus Slater Patient Identification: Samuel Reyes MRN:  [id number] Principal Diagnosis: Hypothermia Diagnosis:  Principal Problem:   Hypothermia Active Problems:   Alcohol use disorder, severe, dependence ([place])   COPD (chronic obstructive pulmonary disease) ([place])   GERD (gastroesophageal reflux disease)   Alcohol intoxication ([place])   Benign essential HTN   Major depressive disorder, recurrent episode, moderate with anxious distress ([place])   Tobacco abuse   Homelessness   Hypothermia due to exposure   Total Time spent with patient: 1 hour  Subjective:   Samuel Reyes is a 64 y.o. male patient admitted with hypothermia and alcohol detox.  Patient seen and evaluated in person by this provider.  He was calmly sitting up in bed watching television prior to assessment.  He denies suicidal/homicidal ideations.  Reports drinking half a gallon of vodka in a case of beer prior to admission and his intake of alcohol varies based on his monies.  Reports anxiety is the only current withdrawal symptoms at this time, Ativan alcohol detox protocol in place.  He is interested in going to rehab after discharge, social work consult will be placed.  Reports he constantly hears the voice of his wife and this is not a new finding this is been going on for years.  Reports "I was "suicidal on admission but no longer feels this way.  He feels the assistance he is getting from the social worker is helping him have hope.  Currently homeless and trying to survival $550 a month for Social Security and hoping the social worker can find him a place to live.  HPI per MD:  Samuel Reyes is a 64 y.o. male with medical history significant of COPD, HTN, GERD, CVA, depression, CAD, alcoholism, h/o DT's, Barrett's esophagus found down near [place] [facility] in a yard. Was wet and cold and noted to have core temp of 87 degrees. In  ED treated with [facility], warmed fluids. Noted to have alcohol level of 327 and lactic acid of 2.8 without concomitant leukocytosis. Recent admission for the same  08/01/2019, with transfer to behavioral health hospital for 4 days of inpatient prior to d/c. He has known stressors including loss of child and wife and homelessness and ongoing alcohol use.Marland Kitchen   Past Psychiatric History: alcohol use d/o, depression  Risk to Self:  none Risk to Others:  none Prior Inpatient Therapy:  multiple Prior Outpatient Therapy:  Family Services  Past Medical History:  Past Medical History:  Diagnosis Date  . Alcoholism ([place])   . Barrett's esophagus   . CAD (coronary artery disease) 2002   MI, no intervention required  . COPD (chronic obstructive pulmonary disease) ([facility])    not on home O2  . Depression   . DTs (delirium tremens) ([place])   . GERD (gastroesophageal reflux disease)   . Headache   . Homeless   . Hypertension   . Stroke [facility]) 2006    Past Surgical History:  Procedure Laterality Date  . BACK SURGERY     3 cervical spine surgeries C4-C5 fused  . COLONOSCOPY N/A 01/04/2014   Procedure: COLONOSCOPY;  Surgeon: Danie Binder, MD;  Location[facility];  Service: Endoscopy;  Laterality: N/A;  1:45  . ESOPHAGOGASTRODUODENOSCOPY N/A 01/04/2014   Procedure: ESOPHAGOGASTRODUODENOSCOPY (EGD);  Surgeon: Danie Binder, MD;  Location: [facility];  Service: Endoscopy;  Laterality: N/A;  . FINGER SURGERY Left  2nd, 3rd, & 4th fingers were cut off by table saw and reattached  . GASTRECTOMY    . HERNIA REPAIR    . INCISIONAL HERNIA REPAIR N/A 01/20/2014   Procedure: LAPAROSCOPIC RECURRENT  INCISIONAL HERNIA with mesh;  Surgeon: Edward Jolly, MD;  Location: [facility] ORS;  Service: General;  Laterality: N/A;  . rt knee arthroscopic surgery    . SHOULDER SURGERY Bilateral    3 surgeries on on left, 2 surgeries on right    Family History:  Family History  Problem Relation Age of Onset  .  Cancer Father        bone  . Cancer Brother        lungs  . Stroke Maternal Grandmother   . Asthma Son        died at age 4 in his sleep   . Spina bifida Son        died at age 61   . Dementia Mother   . Colon cancer Neg Hx    Family Psychiatric  History: mother with dementia Social History:  Social History   Substance and Sexual Activity  Alcohol Use Yes  . Alcohol/week: 15.0 standard drinks  . Types: 15 Cans of beer per week   Comment: last drink 2 days ago     Social History   Substance and Sexual Activity  Drug Use No   Comment: denied using any drugs    Social History   Socioeconomic History  . Marital status: Widowed    Spouse name: Not on file  . Number of children: 3  . Years of education: Not on file  . Highest education level: Not on file  Occupational History  . Occupation: Disability  Tobacco Use  . Smoking status: Current Every Day Smoker    Packs/day: 1.00    Years: 52.00    Pack years: 52.00    Types: Cigarettes    Start date: 07/30/1966  . Smokeless tobacco: Never Used  . Tobacco comment: uses nicotine patch  Substance and Sexual Activity  . Alcohol use: Yes    Alcohol/week: 15.0 standard drinks    Types: 15 Cans of beer per week    Comment: last drink 2 days ago  . Drug use: No    Comment: denied using any drugs  . Sexual activity: Not Currently  Other Topics Concern  . Not on file  Social History Narrative  . Not on file   Social Determinants of Health   Financial Resource Strain:   . Difficulty of Paying Living Expenses: Not on file  Food Insecurity:   . Worried About Charity fundraiser in the Last Year: Not on file  . Ran Out of Food in the Last Year: Not on file  Transportation Needs:   . Lack of Transportation (Medical): Not on file  . Lack of Transportation (Non-Medical): Not on file  Physical Activity:   . Days of Exercise per Week: Not on file  . Minutes of Exercise per Session: Not on file  Stress:   . Feeling of  Stress : Not on file  Social Connections:   . Frequency of Communication with Friends and Family: Not on file  . Frequency of Social Gatherings with Friends and Family: Not on file  . Attends Religious Services: Not on file  . Active Member of Clubs or Organizations: Not on file  . Attends Archivist Meetings: Not on file  . Marital Status: Not on file  Additional Social History:    Allergies:   Allergies  Allergen Reactions  . Bee Venom Anaphylaxis  . Penicillins Rash    Has patient had a PCN reaction causing immediate rash, facial/tongue/throat swelling, SOB or lightheadedness with hypotension: {Yes Has patient had a PCN reaction causing severe rash involving mucus membranes or skin necrosis: YES Has patient had a PCN reaction that required hospitalization Yes Has patient had a PCN reaction occurring within the last 10 years: YES If all of the above answers are "NO", then may proceed with Cephalosporin use.   . Vancomycin Tinitus    Pt states he "almost went deaf" - does not want to take vanc    Labs:  Results for orders placed or performed during the hospital encounter of 09/13/19 (from the past 48 hour(s))  CBC with Differential     Status: Abnormal   Collection Time: 09/13/19  3:54 AM  Result Value Ref Range   WBC 5.7 4.0 - 10.5 K/uL   RBC 5.10 4.22 - 5.81 MIL/uL   Hemoglobin 13.9 13.0 - 17.0 g/dL   HCT 43.6 39.0 - 52.0 %   MCV 85.5 80.0 - 100.0 fL   MCH 27.3 26.0 - 34.0 pg   MCHC 31.9 30.0 - 36.0 g/dL   RDW 20.3 (H) 11.5 - 15.5 %   Platelets 242 150 - 400 K/uL   nRBC 0.0 0.0 - 0.2 %   Neutrophils Relative % 62 %   Neutro Abs 3.5 1.7 - 7.7 K/uL   Lymphocytes Relative 18 %   Lymphs Abs 1.0 0.7 - 4.0 K/uL   Monocytes Relative 18 %   Monocytes Absolute 1.0 0.1 - 1.0 K/uL   Eosinophils Relative 0 %   Eosinophils Absolute 0.0 0.0 - 0.5 K/uL   Basophils Relative 1 %   Basophils Absolute 0.0 0.0 - 0.1 K/uL   Immature Granulocytes 1 %   Abs Immature  Granulocytes 0.08 (H) 0.00 - 0.07 K/uL    Comment: Performed at [facility], [place] [address] [place], [place] [address]  Comprehensive metabolic panel     Status: Abnormal   Collection Time: 09/13/19  3:54 AM  Result Value Ref Range   Sodium 140 135 - 145 mmol/L   Potassium 3.7 3.5 - 5.1 mmol/L   Chloride 101 98 - 111 mmol/L   CO2 26 22 - 32 mmol/L   Glucose, Bld 125 (H) 70 - 99 mg/dL   BUN 7 (L) 8 - 23 mg/dL   Creatinine, Ser 0.62 0.61 - 1.24 mg/dL   Calcium 8.9 8.9 - 10.3 mg/dL   Total Protein 7.7 6.5 - 8.1 g/dL   Albumin 3.6 3.5 - 5.0 g/dL   AST 87 (H) 15 - 41 U/L   ALT 66 (H) 0 - 44 U/L   Alkaline Phosphatase 126 38 - 126 U/L   Total Bilirubin 0.5 0.3 - 1.2 mg/dL   GFR calc non Af Amer >60 >60 mL/min   GFR calc Af Amer >60 >60 mL/min   Anion gap 13 5 - 15    Comment: Performed at [facility], [place] [address], [place],  [address]  Ethanol     Status: Abnormal   Collection Time: 09/13/19  3:54 AM  Result Value Ref Range   Alcohol, Ethyl (B) 327 (HH) <10 mg/dL    Comment: CRITICAL RESULT CALLED TO, READ BACK BY AND VERIFIED WITH: HODGES AT 0510 ON 09/13/2019 BY MOSLEY,J (NOTE) Lowest detectable limit for serum alcohol is  10 mg/dL. For medical purposes only. Performed at [facility], [place] [address] [place], [place] [address]   Lactic acid, plasma     Status: Abnormal   Collection Time: 09/13/19  3:54 AM  Result Value Ref Range   Lactic Acid, Venous 2.8 (HH) 0.5 - 1.9 mmol/L    Comment: CRITICAL RESULT CALLED TO, READ BACK BY AND VERIFIED WITH: TALKINGTON,J AT [id number] ON 09/13/2019 BY MOSLEY,J Performed at [facility], [place] [address], [place], [place] [address]   SARS CORONAVIRUS 2 (TAT 6-24 HRS) Nasopharyngeal Nasopharyngeal Swab     Status: None   Collection Time: 09/13/19  4:55 AM   Specimen: Nasopharyngeal Swab  Result Value Ref Range   SARS Coronavirus 2 NEGATIVE NEGATIVE    Comment:  (NOTE) SARS-CoV-2 target nucleic acids are NOT DETECTED. The SARS-CoV-2 RNA is generally detectable in upper and lower respiratory specimens during the acute phase of infection. Negative results do not preclude SARS-CoV-2 infection, do not rule out co-infections with other pathogens, and should not be used as the sole basis for treatment or other patient management decisions. Negative results must be combined with clinical observations, patient history, and epidemiological information. The expected result is Negative. Fact Sheet for Patients: SugarRoll.be Fact Sheet for Healthcare Providers: https://www.woods-mathews.com/ This test is not yet approved or cleared by the [place] FDA and  has been authorized for detection and/or diagnosis of SARS-CoV-2 by FDA under an Emergency Use Authorization (EUA). This EUA will remain  in effect (meaning this test can be used) for the duration of the COVID-19 declaration under Section 56 4(b)(1) of the Act, 21 U.S.C. section 360bbb-3(b)(1), unless the authorization is terminated or revoked sooner. Performed at [facility], [place] [address] [place], [place] [address]   CBC     Status: Abnormal   Collection Time: 09/13/19  8:28 AM  Result Value Ref Range   WBC 4.3 4.0 - 10.5 K/uL   RBC 3.66 (L) 4.22 - 5.81 MIL/uL   Hemoglobin 9.9 (L) 13.0 - 17.0 g/dL    Comment: REPEATED TO VERIFY   HCT 31.5 (L) 39.0 - 52.0 %   MCV 86.1 80.0 - 100.0 fL   MCH 27.0 26.0 - 34.0 pg   MCHC 31.4 30.0 - 36.0 g/dL   RDW 20.3 (H) 11.5 - 15.5 %   Platelets 204 150 - 400 K/uL   nRBC 0.0 0.0 - 0.2 %    Comment: Performed at [facility], [place] [address] [place], [place] [address]  Creatinine, serum     Status: Abnormal   Collection Time: 09/13/19  8:28 AM  Result Value Ref Range   Creatinine, Ser 0.54 (L) 0.61 - 1.24 mg/dL   GFR calc non Af Amer >60 >60 mL/min   GFR calc Af Amer >60 >60 mL/min     Comment: Performed at [facility], [place] [address] [place], [place] [address]  MRSA PCR Screening     Status: None   Collection Time: 09/13/19 12:50 PM   Specimen: Nasal Mucosa; Nasopharyngeal  Result Value Ref Range   MRSA by PCR NEGATIVE NEGATIVE    Comment:        The GeneXpert MRSA Assay (FDA approved for NASAL specimens only), is one component of a comprehensive MRSA colonization surveillance program. It is not intended to diagnose MRSA infection nor to guide or monitor treatment for MRSA infections. Performed at [facility], [place] [address] [place], [place] [address]  Lactic acid, plasma     Status: None   Collection Time: 09/13/19  1:02 PM  Result Value Ref Range   Lactic Acid, Venous 1.5 0.5 - 1.9 mmol/L    Comment: Performed at [facility], [place] [address] [place], [place] [id number]  Basic metabolic panel     Status: Abnormal   Collection Time: 09/13/19  1:02 PM  Result Value Ref Range   Sodium 138 135 - 145 mmol/L   Potassium 3.3 (L) 3.5 - 5.1 mmol/L   Chloride 108 98 - 111 mmol/L   CO2 20 (L) 22 - 32 mmol/L   Glucose, Bld 94 70 - 99 mg/dL   BUN 7 (L) 8 - 23 mg/dL   Creatinine, Ser 0.62 0.61 - 1.24 mg/dL   Calcium 7.0 (L) 8.9 - 10.3 mg/dL   GFR calc non Af Amer >60 >60 mL/min   GFR calc Af Amer >60 >60 mL/min   Anion gap 10 5 - 15    Comment: Performed at [facility], [place] [address] [place], [place] [address]  Urinalysis, Routine w reflex microscopic     Status: Abnormal   Collection Time: 09/13/19  4:12 PM  Result Value Ref Range   Color, Urine YELLOW YELLOW   APPearance CLOUDY (A) CLEAR   Specific Gravity, Urine 1.005 1.005 - 1.030   pH 6.0 5.0 - 8.0   Glucose, UA 50 (A) NEGATIVE mg/dL   Hgb urine dipstick NEGATIVE NEGATIVE   Bilirubin Urine NEGATIVE NEGATIVE   Ketones, ur NEGATIVE NEGATIVE mg/dL   Protein, ur >=300 (A) NEGATIVE mg/dL   Nitrite NEGATIVE NEGATIVE    Leukocytes,Ua NEGATIVE NEGATIVE   RBC / HPF 0-5 0 - 5 RBC/hpf   WBC, UA 0-5 0 - 5 WBC/hpf   Bacteria, UA RARE (A) NONE SEEN   Squamous Epithelial / LPF 0-5 0 - 5   Mucus PRESENT     Comment: Performed at [facility], [place] [address], [place], [place] [id number]  Basic metabolic panel     Status: Abnormal   Collection Time: 09/14/19  3:27 AM  Result Value Ref Range   Sodium 139 135 - 145 mmol/L   Potassium 3.7 3.5 - 5.1 mmol/L   Chloride 112 (H) 98 - 111 mmol/L   CO2 20 (L) 22 - 32 mmol/L   Glucose, Bld 84 70 - 99 mg/dL   BUN 8 8 - 23 mg/dL   Creatinine, Ser 0.68 0.61 - 1.24 mg/dL   Calcium 7.2 (L) 8.9 - 10.3 mg/dL   GFR calc non Af Amer >60 >60 mL/min   GFR calc Af Amer >60 >60 mL/min   Anion gap 7 5 - 15    Comment: Performed at [facility], [place] [address] [place], [place] [address]  Protime-INR     Status: None   Collection Time: 09/14/19  3:27 AM  Result Value Ref Range   Prothrombin Time 12.4 11.4 - 15.2 seconds   INR 0.9 0.8 - 1.2    Comment: (NOTE) INR goal varies based on device and disease states. Performed at [facility], [place] [address], [place], [place] [address]   CBC     Status: Abnormal   Collection Time: 09/14/19  3:27 AM  Result Value Ref Range   WBC 7.2 4.0 - 10.5 K/uL   RBC 3.69 (L) 4.22 - 5.81 MIL/uL   Hemoglobin 10.0 (L) 13.0 - 17.0 g/dL   HCT 32.7 (L) 39.0 -  52.0 %   MCV 88.6 80.0 - 100.0 fL   MCH 27.1 26.0 - 34.0 pg   MCHC 30.6 30.0 - 36.0 g/dL   RDW 20.8 (H) 11.5 - 15.5 %   Platelets 202 150 - 400 K/uL   nRBC 0.0 0.0 - 0.2 %    Comment: Performed at [facility], [place] [address], [place], [place] [address]    Current Facility-Administered Medications  Medication Dose Route Frequency Provider Last Rate Last Admin  . 0.9 %  sodium chloride infusion   Intravenous Continuous Bodenheimer, Charles A, NP 100 mL/hr at 09/14/19 0600 Rate Verify at 09/14/19 0600  . albuterol  (PROVENTIL) (2.5 MG/3ML) 0.083% nebulizer solution 2.5 mg  2.5 mg Nebulization Q6H PRN Donnamae Jude, MD      . Chlorhexidine Gluconate Cloth 2 % PADS 6 each  6 each Topical Q0600 Donnamae Jude, MD   6 each at 09/14/19 0902  . cyclobenzaprine (FLEXERIL) tablet 10 mg  10 mg Oral TID PRN Donnamae Jude, MD   10 mg at 09/13/19 1315  . enoxaparin (LOVENOX) injection 40 mg  40 mg Subcutaneous Q24H Donnamae Jude, MD   40 mg at 09/14/19 0855  . fluticasone furoate-vilanterol (BREO ELLIPTA) 200-25 MCG/INH 1 puff  1 puff Inhalation Daily Donnamae Jude, MD   1 puff at 09/14/19 0902  . folic acid (FOLVITE) tablet 1 mg  1 mg Oral Daily Donnamae Jude, MD   1 mg at 09/14/19 0854  . gabapentin (NEURONTIN) capsule 300 mg  300 mg Oral TID Donnamae Jude, MD   300 mg at 09/14/19 0854  . ibuprofen (ADVIL) tablet 600 mg  600 mg Oral Q6H PRN Donnamae Jude, MD      . ipratropium-albuterol (DUONEB) 0.5-2.5 (3) MG/3ML nebulizer solution 3 mL  3 mL Nebulization Q6H Elodia Florence., MD      . LORazepam (ATIVAN) injection 0-4 mg  0-4 mg Intravenous Q6H Donnamae Jude, MD       Or  . LORazepam (ATIVAN) tablet 0-4 mg  0-4 mg Oral Q6H Donnamae Jude, MD   2 mg at 09/13/19 2103  . multivitamin with minerals tablet 1 tablet  1 tablet Oral Daily Donnamae Jude, MD   1 tablet at 09/14/19 0854  . nicotine (NICODERM CQ - dosed in mg/24 hours) patch 21 mg  21 mg Transdermal Daily Donnamae Jude, MD   21 mg at 09/14/19 0853  . pantoprazole (PROTONIX) EC tablet 40 mg  40 mg Oral BID Donnamae Jude, MD   40 mg at 09/14/19 0854  . potassium chloride SA (KLOR-CON) CR tablet 20 mEq  20 mEq Oral BID Donnamae Jude, MD   20 mEq at 09/14/19 0854  . predniSONE (DELTASONE) tablet 40 mg  40 mg Oral Q breakfast Elodia Florence., MD   40 mg at 09/14/19 0853  . senna-docusate (Senokot-S) tablet 1 tablet  1 tablet Oral QHS PRN Donnamae Jude, MD      . sertraline (ZOLOFT) tablet 50 mg  50 mg Oral Daily Donnamae Jude, MD   50 mg at  09/14/19 0854  . thiamine tablet 100 mg  100 mg Oral Daily Donnamae Jude, MD   100 mg at 09/14/19 [id number]   Or  . thiamine (B-1) injection 100 mg  100 mg Intravenous Daily Donnamae Jude, MD      . zolpidem [facility]) tablet 5 mg  5 mg Oral QHS PRN,MR X 1 Donnamae Jude, MD        Musculoskeletal: Strength & Muscle Tone: decreased Gait & Station: did not witness Patient leans: N/A  Psychiatric Specialty Exam: Physical Exam  Nursing note and vitals reviewed. Constitutional: He is oriented to person, place, and time. He appears well-developed and well-nourished.  HENT:  Head: Normocephalic.  Respiratory: Effort normal.  Musculoskeletal:        General: Normal range of motion.     Cervical back: Normal range of motion.  Neurological: He is alert and oriented to person, place, and time.  Psychiatric: His speech is normal and behavior is normal. Judgment and thought content normal. Cognition and memory are normal. He exhibits a depressed mood.    Review of Systems  Psychiatric/Behavioral: Positive for dysphoric mood.  All other systems reviewed and are negative.   Blood pressure (!) 174/87, pulse 97, temperature (!) 97.5 F (36.4 C), temperature source Oral, resp. rate 20, height 5\' 4"  (1.626 m), weight 68 kg, SpO2 91 %.Body mass index is 25.75 kg/m.  General Appearance: Casual  Eye Contact:  Good  Speech:  Normal Rate  Volume:  Normal  Mood:  Depressed  Affect:  Congruent  Thought Process:  Coherent and Descriptions of Associations: Intact  Orientation:  Full (Time, Place, and Person)  Thought Content:  WDL and Logical  Suicidal Thoughts:  No  Homicidal Thoughts:  No  Memory:  Immediate;   Fair Recent;   Fair Remote;   Fair  Judgement:  Fair  Insight:  Fair  Psychomotor Activity:  Decreased  Concentration:  Concentration: Fair and Attention Span: Fair  Recall:  [employer] of Knowledge:  Fair  Language:  Good  Akathisia:  No  Handed:  Right  AIMS (if indicated):      Assets:  Leisure Time Resilience  ADL's:  Intact  Cognition:  WNL  Sleep:      64 year old male admitted for hypothermia and alcohol detox.  Long history of alcohol abuse with multiple hospitalizations and rehabs.  Denies suicidal/homicidal ideations.  Reports of hearing his ex wife which is a constant for him, this does not bother him.  Anxiety is the only withdrawal symptom he is experiencing at this time.  Treatment Plan Summary: Alcohol induced mood disorder: -Continue Ativan alcohol detox protocol -Recommend substance abuse rehab after discharge social work consult placed -Continue gabapentin 300 mg 3 times daily -Recommend Prozac 20 mg daily  Disposition: No evidence of imminent risk to self or others at present.    Waylan Boga, NP 09/14/2019 9:19 AM

## 2019-09-14 NOTE — Progress Notes (Addendum)
PROGRESS NOTE    Samuel Reyes  VZD:[id number] DOB: 1956/07/09 DOA: 09/13/2019 PCP: Samuel Perna, NP  Brief Narrative:  Samuel Reyes is Samuel Reyes 64 y.o. male with medical history significant of COPD, HTN, GERD, CVA, depression, CAD, alcoholism, h/o DT's, Barrett's esophagus found down near [place] in Samuel Reyes yard. Was wet and cold and noted to have core temp of 87 degrees. In ED treated with [facility], warmed fluids. Noted to have alcohol level of 327 and lactic acid of 2.8 without concomitant leukocytosis. Recent admission for the same  08/01/2019, with transfer to behavioral health hospital for 4 days of inpatient prior to d/c. He has known stressors including loss of child and wife and homelessness and ongoing alcohol use..   Assessment & Plan:   Principal Problem:   Hypothermia Active Problems:   COPD (chronic obstructive pulmonary disease) (HCC)   GERD (gastroesophageal reflux disease)   Alcohol intoxication ([place])   Benign essential HTN   Alcohol use disorder, severe, dependence (HCC)   Major depressive disorder, recurrent episode, moderate with anxious distress ([place])   Tobacco abuse   Homelessness   Hypothermia due to exposure  Hypothermia  Fever - due to exposure, rewarming and fluid resuscitation taking place.  Continue to monitor.  Had fever yesterday, 2/14, ? If related to rewarming. Follow blood and urine cultures UA not suggestive of infection CXR without active disease  Alcohol intoxication - CIWA protocol. Has h/o DT's, monitor carefully. Encourage cessation  Depression/Suicidal ideation - suicide precautions, resumed Zoloft, Gabapentin, placed on these during [facility] admission in early January.  Noted suicidal ideation to me this morning, said essentially he didn't want to live anymore Seen by psych, denied suicidal/homicidal ideation on their evaluation -> recommended detox protocol, substance abuse rehab, gabapentin 300 mg TID and prozac 20 mg daily  Chest  Pain: poor historian regarding this, initially noted chronic, then described as similar to previous MI.  Follow EKG (NSR without concerning ST-T wave changes) , troponin (minimally elevated, downtrending - doesn't appear consistent with ACS).   Follow echo  COPD Exacerbation:  mild, wheezing on exam, no O2 requirement.  Steroids, scheduled and prn nebs, follow.  Tobacco use disorder - placed on NIcotine patch  GERD - on PPI, continued home protonix  Homelessness - TOC consulted, recent hospitalization for same, has trouble getting meds, limited social support  HTN - hypotensive due to hypothermia. On Norvasc at home, held for now.  Elevated Liver Enzymes: 2/2 etoh, follow acute hepatitis panel  DVT prophylaxis: lovenox Code Status: currently full code, but has been DNR in the past, will reassess after psych evaluation Family Communication: none at bedside Disposition Plan:  . Patient came from: home           . Anticipated d/c place: home . Barriers to d/c OR conditions which need to be met to effect Samuel Reyes safe d/c: pending improvement in etoh withdrawal, safe discharge plan  Consultants:   none  Procedures:   none  Antimicrobials:  Anti-infectives (From admission, onward)   None     Subjective: Feels like he doesn't want to live anymore C/o chronic CP  Objective: Vitals:   09/14/19 0600 09/14/19 0748 09/14/19 0920 09/14/19 1152  BP: (!) 174/87     Pulse: 97     Resp: 20     Temp:  (!) 97.5 F (36.4 C)  (!) 97.1 F (36.2 C)  TempSrc:  Oral    SpO2: 91%  95%   Weight:  Height:        Intake/Output Summary (Last 24 hours) at 09/14/2019 1326 Last data filed at 09/14/2019 1300 Gross per 24 hour  Intake 4455.4 ml  Output 2225 ml  Net 2230.4 ml   Filed Weights   09/13/19 0907  Weight: 68 kg    Examination:  General exam: Appears calm and comfortable  Respiratory system: mild wheezing throughout Cardiovascular system: S1 & S2 heard, RRR.    Gastrointestinal system: Abdomen is nondistended, soft and nontender.  Central nervous system: Alert and oriented. No focal neurological deficits. Extremities: no LEE Skin: No rashes, lesions or ulcers Psychiatry: Judgement and insight appear normal. Mood & affect appropriate.     Data Reviewed: I have personally reviewed following labs and imaging studies  CBC: Recent Labs  Lab 09/13/19 0354 09/13/19 0828 09/14/19 0327  WBC 5.7 4.3 7.2  NEUTROABS 3.5  --   --   HGB 13.9 9.9* 10.0*  HCT 43.6 31.5* 32.7*  MCV 85.5 86.1 88.6  PLT 242 204 720   Basic Metabolic Panel: Recent Labs  Lab 09/13/19 0354 09/13/19 0828 09/13/19 1302 09/14/19 0327  NA 140  --  138 139  K 3.7  --  3.3* 3.7  CL 101  --  108 112*  CO2 26  --  20* 20*  GLUCOSE 125*  --  94 84  BUN 7*  --  7* 8  CREATININE 0.62 0.54* 0.62 0.68  CALCIUM 8.9  --  7.0* 7.2*   GFR: Estimated Creatinine Clearance: 78.1 mL/min (by C-G formula based on SCr of 0.68 mg/dL). Liver Function Tests: Recent Labs  Lab 09/13/19 0354 09/14/19 0849  AST 87* 114*  ALT 66* 67*  ALKPHOS 126 83  BILITOT 0.5 0.7  PROT 7.7 5.5*  ALBUMIN 3.6 2.5*   No results for input(s): LIPASE, AMYLASE in the last 168 hours. No results for input(s): AMMONIA in the last 168 hours. Coagulation Profile: Recent Labs  Lab 09/14/19 0327  INR 0.9   Cardiac Enzymes: No results for input(s): CKTOTAL, CKMB, CKMBINDEX, TROPONINI in the last 168 hours. BNP (last 3 results) No results for input(s): PROBNP in the last 8760 hours. HbA1C: No results for input(s): HGBA1C in the last 72 hours. CBG: No results for input(s): GLUCAP in the last 168 hours. Lipid Profile: No results for input(s): CHOL, HDL, LDLCALC, TRIG, CHOLHDL, LDLDIRECT in the last 72 hours. Thyroid Function Tests: No results for input(s): TSH, T4TOTAL, FREET4, T3FREE, THYROIDAB in the last 72 hours. Anemia Panel: No results for input(s): VITAMINB12, FOLATE, FERRITIN, TIBC, IRON,  RETICCTPCT in the last 72 hours. Sepsis Labs: Recent Labs  Lab 09/13/19 0354 09/13/19 1302  LATICACIDVEN 2.8* 1.5    Recent Results (from the past 240 hour(s))  SARS CORONAVIRUS 2 (TAT 6-24 HRS) Nasopharyngeal Nasopharyngeal Swab     Status: None   Collection Time: 09/13/19  4:55 AM   Specimen: Nasopharyngeal Swab  Result Value Ref Range Status   SARS Coronavirus 2 NEGATIVE NEGATIVE Final    Comment: (NOTE) SARS-CoV-2 target nucleic acids are NOT DETECTED. The SARS-CoV-2 RNA is generally detectable in upper and lower respiratory specimens during the acute phase of infection. Negative results do not preclude SARS-CoV-2 infection, do not rule out co-infections with other pathogens, and should not be used as the sole basis for treatment or other patient management decisions. Negative results must be combined with clinical observations, patient history, and epidemiological information. The expected result is Negative. Fact Sheet for Patients: SugarRoll.be Fact Sheet for  Healthcare Providers: https://www.woods-mathews.com/ This test is not yet approved or cleared by the [place] and  has been authorized for detection and/or diagnosis of SARS-CoV-2 by FDA under an Emergency Use Authorization (EUA). This EUA will remain  in effect (meaning this test can be used) for the duration of the COVID-19 declaration under Section 56 4(b)(1) of the Act, 21 U.S.C. section 360bbb-3(b)(1), unless the authorization is terminated or revoked sooner. Performed at [facility], [place] [address] [place], [place] [address]   MRSA PCR Screening     Status: None   Collection Time: 09/13/19 12:50 PM   Specimen: Nasal Mucosa; Nasopharyngeal  Result Value Ref Range Status   MRSA by PCR NEGATIVE NEGATIVE Final    Comment:        The GeneXpert MRSA Assay (FDA approved for NASAL specimens only), is one component of [facility] comprehensive MRSA  colonization surveillance program. It is not intended to diagnose MRSA infection nor to guide or monitor treatment for MRSA infections. Performed at [facility], [place] [address], [place], [place] [address]          Radiology Studies: DG Chest 1 View  Result Date: 09/13/2019 CLINICAL DATA:  Shortness of breath. EXAM: CHEST  1 VIEW COMPARISON:  August 01, 2019 FINDINGS: The heart size and mediastinal contours are within normal limits. Both lungs are clear. The visualized skeletal structures are unremarkable. IMPRESSION: No active disease. Electronically Signed   By: Abelardo Diesel M.D.   On: 09/13/2019 16:50        Scheduled Meds: . Chlorhexidine Gluconate Cloth  6 each Topical Q0600  . enoxaparin (LOVENOX) injection  40 mg Subcutaneous Q24H  . fluticasone furoate-vilanterol  1 puff Inhalation Daily  . folic acid  1 mg Oral Daily  . gabapentin  300 mg Oral TID  . ipratropium-albuterol  3 mL Nebulization Q6H  . LORazepam  0-4 mg Intravenous Q6H   Or  . LORazepam  0-4 mg Oral Q6H  . multivitamin with minerals  1 tablet Oral Daily  . nicotine  21 mg Transdermal Daily  . pantoprazole  40 mg Oral BID  . potassium chloride  20 mEq Oral BID  . predniSONE  40 mg Oral Q breakfast  . sertraline  50 mg Oral Daily  . thiamine  100 mg Oral Daily   Or  . thiamine  100 mg Intravenous Daily   Continuous Infusions: . sodium chloride 100 mL/hr at 09/14/19 0600     LOS: 1 day    Time spent: over 30 min    Fayrene Helper, MD [facility]   To contact the attending provider between 7A-7P or the covering provider during after hours 7P-7A, please log into the web site [email or web address] and access using universal  password for that web site. If you do not have the password, please call the hospital operator.  09/14/2019, 1:26 PM

## 2019-09-14 NOTE — Progress Notes (Signed)
Pt was awake and alert when I arrived.  His sitter was bedside.  He shared with me that he has buried two sons and his wife.  His first son was a year old when he passed; a second son passed 8 years ago and his wife passed 3 years ago. He said he drank beer and another alcohol with the intentions of walking in front of a car but he passed out instead. He said the police found him and brought him here. He has a daughter whom he presumes lives in [place] who does not stay in touch with him. He said he lost her number and does not know how to contact her. As requested by his nurse, I provided clothing for pt for which he was very appreciative.  He was appreciative of visit and prayer. Please page if additional support is needed.  [place], [place]   09/14/19 1200  Clinical Encounter Type  Visited With Patient

## 2019-09-14 NOTE — Plan of Care (Signed)
  Problem: Safety: Goal: Ability to remain free from injury will improve Outcome: Progressing   Problem: Clinical Measurements: Goal: Ability to maintain clinical measurements within normal limits will improve Outcome: Progressing

## 2019-09-14 NOTE — Progress Notes (Signed)
Echocardiogram 2D Echocardiogram has been performed.  Oneal Deputy Wendall Isabell 09/14/2019, 3:04 PM

## 2019-09-15 LAB — CBC WITH DIFFERENTIAL/PLATELET
Abs Immature Granulocytes: 0.04 10*3/uL (ref 0.00–0.07)
Basophils Absolute: 0 10*3/uL (ref 0.0–0.1)
Basophils Relative: 0 %
Eosinophils Absolute: 0 10*3/uL (ref 0.0–0.5)
Eosinophils Relative: 0 %
HCT: 32.7 % — ABNORMAL LOW (ref 39.0–52.0)
Hemoglobin: 10.4 g/dL — ABNORMAL LOW (ref 13.0–17.0)
Immature Granulocytes: 0 %
Lymphocytes Relative: 12 %
Lymphs Abs: 1.3 10*3/uL (ref 0.7–4.0)
MCH: 27.5 pg (ref 26.0–34.0)
MCHC: 31.8 g/dL (ref 30.0–36.0)
MCV: 86.5 fL (ref 80.0–100.0)
Monocytes Absolute: 1.2 10*3/uL — ABNORMAL HIGH (ref 0.1–1.0)
Monocytes Relative: 10 %
Neutro Abs: 8.5 10*3/uL — ABNORMAL HIGH (ref 1.7–7.7)
Neutrophils Relative %: 78 %
Platelets: 233 10*3/uL (ref 150–400)
RBC: 3.78 MIL/uL — ABNORMAL LOW (ref 4.22–5.81)
RDW: 20.6 % — ABNORMAL HIGH (ref 11.5–15.5)
WBC: 11.1 10*3/uL — ABNORMAL HIGH (ref 4.0–10.5)
nRBC: 0 % (ref 0.0–0.2)

## 2019-09-15 LAB — COMPREHENSIVE METABOLIC PANEL
ALT: 60 U/L — ABNORMAL HIGH (ref 0–44)
AST: 63 U/L — ABNORMAL HIGH (ref 15–41)
Albumin: 2.8 g/dL — ABNORMAL LOW (ref 3.5–5.0)
Alkaline Phosphatase: 79 U/L (ref 38–126)
Anion gap: 8 (ref 5–15)
BUN: 6 mg/dL — ABNORMAL LOW (ref 8–23)
CO2: 24 mmol/L (ref 22–32)
Calcium: 8 mg/dL — ABNORMAL LOW (ref 8.9–10.3)
Chloride: 106 mmol/L (ref 98–111)
Creatinine, Ser: 0.74 mg/dL (ref 0.61–1.24)
GFR calc Af Amer: >60 mL/min (ref >60)
GFR calc non Af Amer: >60 mL/min (ref >60)
Glucose, Bld: 108 mg/dL — ABNORMAL HIGH (ref 70–99)
Potassium: 3.7 mmol/L (ref 3.5–5.1)
Sodium: 138 mmol/L (ref 135–145)
Total Bilirubin: 0.4 mg/dL (ref 0.3–1.2)
Total Protein: 6 g/dL — ABNORMAL LOW (ref 6.5–8.1)

## 2019-09-15 LAB — MAGNESIUM: Magnesium: 1.5 mg/dL — ABNORMAL LOW (ref 1.7–2.4)

## 2019-09-15 LAB — PHOSPHORUS: Phosphorus: 3 mg/dL (ref 2.5–4.6)

## 2019-09-15 MED ORDER — MAGNESIUM SULFATE 2 GM/50ML IV SOLN
2.0000 g | Freq: Once | INTRAVENOUS | Status: AC
  Administered 2019-09-15: 2 g via INTRAVENOUS
  Filled 2019-09-15: qty 50

## 2019-09-15 MED ORDER — LABETALOL HCL 5 MG/ML IV SOLN
10.0000 mg | Freq: Once | INTRAVENOUS | Status: AC
  Administered 2019-09-15: 04:00:00 10 mg via INTRAVENOUS
  Filled 2019-09-15: qty 4

## 2019-09-15 MED ORDER — TRAZODONE HCL 50 MG PO TABS
50.0000 mg | ORAL_TABLET | Freq: Every day | ORAL | Status: DC
  Administered 2019-09-15: 50 mg via ORAL
  Filled 2019-09-15: qty 1

## 2019-09-15 NOTE — Progress Notes (Signed)
Pt transferred to 1518.  In stable condition.  Sitter with pt.

## 2019-09-15 NOTE — Progress Notes (Addendum)
PROGRESS NOTE    Samuel Reyes  PFY:[id number] DOB: Aug 15, 1955 DOA: 09/13/2019 PCP: Samuel Perna, NP  Brief Narrative:  Samuel Reyes is Samuel Reyes 64 y.o. male with medical history significant of COPD, HTN, GERD, CVA, depression, CAD, alcoholism, h/o DT's, Barrett's esophagus found [place] near [facility] [name]. Was wet and cold and noted to have core temp of 87 degrees. In ED treated with [facility], warmed fluids. Noted to have alcohol level of 327 and lactic acid of 2.8 without concomitant leukocytosis. Recent admission for the same  08/01/2019, with transfer to behavioral health hospital for 4 days of inpatient prior to d/c. He has known stressors including loss of child and wife and homelessness and ongoing alcohol use..   Assessment & Plan:   Principal Problem:   Hypothermia Active Problems:   COPD (chronic obstructive pulmonary disease) (HCC)   GERD (gastroesophageal reflux disease)   Alcohol intoxication ([place])   Benign essential HTN   Alcohol use disorder, severe, dependence (HCC)   Major depressive disorder, recurrent episode, moderate with anxious distress ([place])   Tobacco abuse   Homelessness   Hypothermia due to exposure  Hypothermia  Fever - due to exposure, rewarming and fluid resuscitation taking place.  Continue to monitor.  Had fever 2/14, ? If related to rewarming. Follow blood (NGTD) and urine cultures (NG) UA not suggestive of infection CXR without active disease Repeat CXR ordered 2/15 PM for ?aspiration -> speech eval -> per speech, consider esophageal w/u if indicated (2/16 note)  Alcohol intoxication - CIWA protocol. Has h/o DT's, monitor carefully. Encourage cessation Continue CIWA protocol  Depression/Suicidal ideation - suicide precautions, resumed Zoloft, Gabapentin, placed on these during [facility] admission in early January.  Told me his thoughts (SI) never really goes away -> when I asked if he would act on them, he didn't directly answer, but  noted that he had tried to cut his wrists before -> will discuss with psychiatry as this seems different than their evaluation (unable to reach by phone, consult placed again) Seen by psych, denied suicidal/homicidal ideation on their evaluation -> recommended detox protocol, substance abuse rehab, gabapentin 300 mg TID and prozac 20 mg daily  Chest Pain: poor historian regarding this, initially noted chronic, then described as similar to previous MI.  Follow EKG (NSR without concerning ST-T wave changes) , troponin (minimally elevated, downtrending - doesn't appear consistent with ACS).   Echo with normal EF, no RWMA (see report)  COPD Exacerbation:  wheezing improved.  Steroids x5 days, scheduled and prn nebs, follow.  Tobacco use disorder - placed on NIcotine patch  GERD - on PPI, continued home protonix  Homelessness - TOC consulted, recent hospitalization for same, has trouble getting meds, limited social support  HTN - hypotensive due to hypothermia. On Norvasc at home, held for now.  Elevated Liver Enzymes: 2/2 etoh, follow acute hepatitis panel (negative) - improving  Hypomagnesemia: replace and follow  Leukocytosis: 2/2 steroids, follow  DVT prophylaxis: lovenox Code Status: currently full code, but has been DNR in the past, will need to reassess after psych evaluation Family Communication: none at bedside Disposition Plan:  . Patient came from: home           . Anticipated d/c place: home . Barriers to d/c OR conditions which need to be met to effect Sharaya Boruff safe d/c: pending improvement in etoh withdrawal, safe discharge plan  Consultants:   Psyhciatry  Procedures:  Echo IMPRESSIONS    1. Left ventricular ejection fraction,  by estimation, is 60 to 65%. The  left ventricle has normal function. The left ventricle has no regional  wall motion abnormalities. Left ventricular diastolic parameters were  normal.  2. Right ventricular systolic function is normal. The  right ventricular  size is normal. There is normal pulmonary artery systolic pressure. The  estimated right ventricular systolic pressure is 31.5 mmHg.  3. The mitral valve is grossly normal. No evidence of mitral valve  regurgitation.  4. The aortic valve is tricuspid. Aortic valve regurgitation is not  visualized. No aortic stenosis is present.  5. The inferior vena cava is dilated in size with >50% respiratory  variability, suggesting right atrial pressure of 8 mmHg.   Antimicrobials:  Anti-infectives (From admission, onward)   None     Subjective: C/o nonspecific HA Says the suicidal thoughts never really go away Notes he's acted on them before, but didn't directly answer when asked if he'd act on them now  Objective: Vitals:   09/15/19 0600 09/15/19 0904 09/15/19 0954 09/15/19 1403  BP: (!) 144/101  135/78 (!) 132/92  Pulse:   86 (!) 102  Resp: (!) 24  20 20   Temp:      TempSrc:      SpO2: 98% 96% 100% 95%  Weight:      Height:        Intake/Output Summary (Last 24 hours) at 09/15/2019 1421 Last data filed at 09/15/2019 1334 Gross per 24 hour  Intake 2306.45 ml  Output 5275 ml  Net -2968.55 ml   Filed Weights   09/13/19 0907  Weight: 68 kg    Examination:  General: No acute distress. Cardiovascular: Heart sounds show Tarris Delbene regular rate, and rhythm.  Lungs: Clear to auscultation bilaterally  Abdomen: Soft, nontender, nondistended  Neurological: Alert and oriented 3. Moves all extremities 4. Cranial nerves II through XII grossly intact. Skin: Warm and dry. No rashes or lesions. Extremities: No clubbing or cyanosis. No edema.  Data Reviewed: I have personally reviewed following labs and imaging studies  CBC: Recent Labs  Lab 09/13/19 0354 09/13/19 0828 09/14/19 0327 09/15/19 0237  WBC 5.7 4.3 7.2 11.1*  NEUTROABS 3.5  --   --  8.5*  HGB 13.9 9.9* 10.0* 10.4*  HCT 43.6 31.5* 32.7* 32.7*  MCV 85.5 86.1 88.6 86.5  PLT 242 204 202 176   Basic  Metabolic Panel: Recent Labs  Lab 09/13/19 0354 09/13/19 0828 09/13/19 1302 09/14/19 0327 09/15/19 0237  NA 140  --  138 139 138  K 3.7  --  3.3* 3.7 3.7  CL 101  --  108 112* 106  CO2 26  --  20* 20* 24  GLUCOSE 125*  --  94 84 108*  BUN 7*  --  7* 8 6*  CREATININE 0.62 0.54* 0.62 0.68 0.74  CALCIUM 8.9  --  7.0* 7.2* 8.0*  MG  --   --   --   --  1.5*  PHOS  --   --   --   --  3.0   GFR: Estimated Creatinine Clearance: 78.1 mL/min (by C-G formula based on SCr of 0.74 mg/dL). Liver Function Tests: Recent Labs  Lab 09/13/19 0354 09/14/19 0849 09/15/19 0237  AST 87* 114* 63*  ALT 66* 67* 60*  ALKPHOS 126 83 79  BILITOT 0.5 0.7 0.4  PROT 7.7 5.5* 6.0*  ALBUMIN 3.6 2.5* 2.8*   No results for input(s): LIPASE, AMYLASE in the last 168 hours. No results for input(s): AMMONIA in  the last 168 hours. Coagulation Profile: Recent Labs  Lab 09/14/19 0327  INR 0.9   Cardiac Enzymes: No results for input(s): CKTOTAL, CKMB, CKMBINDEX, TROPONINI in the last 168 hours. BNP (last 3 results) No results for input(s): PROBNP in the last 8760 hours. HbA1C: No results for input(s): HGBA1C in the last 72 hours. CBG: No results for input(s): GLUCAP in the last 168 hours. Lipid Profile: No results for input(s): CHOL, HDL, LDLCALC, TRIG, CHOLHDL, LDLDIRECT in the last 72 hours. Thyroid Function Tests: No results for input(s): TSH, T4TOTAL, FREET4, T3FREE, THYROIDAB in the last 72 hours. Anemia Panel: No results for input(s): VITAMINB12, FOLATE, FERRITIN, TIBC, IRON, RETICCTPCT in the last 72 hours. Sepsis Labs: Recent Labs  Lab 09/13/19 0354 09/13/19 1302  LATICACIDVEN 2.8* 1.5    Recent Results (from the past 240 hour(s))  SARS CORONAVIRUS 2 (TAT 6-24 HRS) Nasopharyngeal Nasopharyngeal Swab     Status: None   Collection Time: 09/13/19  4:55 AM   Specimen: Nasopharyngeal Swab  Result Value Ref Range Status   SARS Coronavirus 2 NEGATIVE NEGATIVE Final    Comment:  (NOTE) SARS-CoV-2 target nucleic acids are NOT DETECTED. The SARS-CoV-2 RNA is generally detectable in upper and lower respiratory specimens during the acute phase of infection. Negative results do not preclude SARS-CoV-2 infection, do not rule out co-infections with other pathogens, and should not be used as the sole basis for treatment or other patient management decisions. Negative results must be combined with clinical observations, patient history, and epidemiological information. The expected result is Negative. Fact Sheet for Patients: SugarRoll.be Fact Sheet for Healthcare Providers: https://www.woods-mathews.com/ This test is not yet approved or cleared by the [place] FDA and  has been authorized for detection and/or diagnosis of SARS-CoV-2 by FDA under an Emergency Use Authorization (EUA). This EUA will remain  in effect (meaning this test can be used) for the duration of the COVID-19 declaration under Section 56 4(b)(1) of the Act, 21 U.S.C. section 360bbb-3(b)(1), unless the authorization is terminated or revoked sooner. Performed at [facility], [place] [address] [place], [place] [address]   MRSA PCR Screening     Status: None   Collection Time: 09/13/19 12:50 PM   Specimen: Nasal Mucosa; Nasopharyngeal  Result Value Ref Range Status   MRSA by PCR NEGATIVE NEGATIVE Final    Comment:        The GeneXpert MRSA Assay (FDA approved for NASAL specimens only), is one component of [place] comprehensive MRSA colonization surveillance program. It is not intended to diagnose MRSA infection nor to guide or monitor treatment for MRSA infections. Performed at [facility], [place] [address], [place], [place] [address]   Culture, Urine     Status: None   Collection Time: 09/13/19  4:12 PM   Specimen: Urine, Random  Result Value Ref Range Status   Specimen Description   Final    URINE, RANDOM Performed at [place] [address] [place], [place] [address]    Special Requests   Final    Normal Performed at [facility], [place] [address], [place], [place] [address]    Culture   Final    NO GROWTH Performed at [facility], [place] [address] [place], [place] [address]    Report Status 09/14/2019 FINAL  Final  Culture, blood (routine x 2)     Status: None (Preliminary result)   Collection Time: 09/13/19  4:53 PM   Specimen: BLOOD  Result Value Ref Range Status   Specimen Description   Final    BLOOD RIGHT ARM Performed at [place] [address], [place], [place] [address]    Special Requests   Final    BOTTLES DRAWN AEROBIC AND ANAEROBIC Blood Culture adequate volume Performed at [place] [address] [place], [place] [address]    Culture   Final    NO GROWTH 1 DAY Performed at [facility], [place] [address] [place], [place] [address]    Report Status PENDING  Incomplete  Culture, blood (routine x 2)     Status: None (Preliminary result)   Collection Time: 09/13/19  4:56 PM   Specimen: BLOOD  Result Value Ref Range Status   Specimen Description   Final    BLOOD RIGHT HAND Performed at [place] [address] [place], [place] [address]    Special Requests   Final    BOTTLES DRAWN AEROBIC AND ANAEROBIC Blood Culture adequate volume Performed at [place] [address] [place], [place] [address]    Culture   Final    NO GROWTH 1 DAY Performed at [facility], [place] [address] [place], [place] [address]    Report Status PENDING  Incomplete         Radiology Studies: DG Chest 1 View  Result Date: 09/13/2019 CLINICAL DATA:  Shortness of breath. EXAM: CHEST  1 VIEW COMPARISON:  August 01, 2019 FINDINGS: The heart size and mediastinal contours are within normal limits. Both lungs are clear. The visualized skeletal structures  are unremarkable. IMPRESSION: No active disease. Electronically Signed   By: Abelardo Diesel M.D.   On: 09/13/2019 16:50   DG CHEST PORT 1 VIEW  Result Date: 09/14/2019 CLINICAL DATA:  Aspiration EXAM: PORTABLE CHEST 1 VIEW COMPARISON:  09/13/2019 FINDINGS: Frontal view of the chest demonstrates an unremarkable cardiac silhouette. No airspace disease, effusion, or pneumothorax. No evidence of aspiration. IMPRESSION: 1. No acute intrathoracic process. Electronically Signed   By: Randa Ngo M.D.   On: 09/14/2019 22:28   ECHOCARDIOGRAM COMPLETE  Result Date: 09/14/2019    ECHOCARDIOGRAM REPORT   Patient Name:   GAREY ALLEVA Date of Exam: 09/14/2019 Medical Rec #:  [id number]      Height:       64.0 in Accession #:    [id number]     Weight:       150.0 lb Date of Birth:  [birth year]     BSA:          1.73 m Patient Age:    51 years       BP:           174/87 mmHg Patient Gender: M              HR:           96 bpm. Exam Location:  Inpatient Procedure: 2D Echo, Color Doppler and Cardiac Doppler Indications:    R07.9* Chest pain, unspecified  History:        Patient has prior history of Echocardiogram examinations, most                 recent 08/03/2016. Signs/Symptoms:Chest Pain; Risk                 Factors:Hypertension. ETOH and tobacco abuse.  Sonographer:    Raquel Sarna Senior RDCS Referring Phys: [phone] Robertson [place]ough CALDWELL Peck  1. Left  ventricular ejection fraction, by estimation, is 60 to 65%. The left ventricle has normal function. The left ventricle has no regional wall motion abnormalities. Left ventricular diastolic parameters were normal.  2. Right ventricular systolic function is normal. The right ventricular size is normal. There is normal pulmonary artery systolic pressure. The estimated right ventricular systolic pressure is 44.9 mmHg.  3. The mitral valve is grossly normal. No evidence of mitral valve regurgitation.  4. The aortic valve is tricuspid. Aortic valve regurgitation is not  visualized. No aortic stenosis is present.  5. The inferior vena cava is dilated in size with >50% respiratory variability, suggesting right atrial pressure of 8 mmHg. Comparison(s): Electa Sterry prior study was performed on 08/03/2016. Prior images unable to be directly viewed, comparison made by report only. No significant change from prior study. FINDINGS  Left Ventricle: Left ventricular ejection fraction, by estimation, is 60 to 65%. The left ventricle has normal function. The left ventricle has no regional wall motion abnormalities. The left ventricular internal cavity size was normal in size. There is  no left ventricular hypertrophy. Left ventricular diastolic parameters were normal. Right Ventricle: The right ventricular size is normal. No increase in right ventricular wall thickness. Right ventricular systolic function is normal. There is normal pulmonary artery systolic pressure. The tricuspid regurgitant velocity is 2.20 m/s, and  with an assumed right atrial pressure of 8 mmHg, the estimated right ventricular systolic pressure is 20.1 mmHg. Left Atrium: Left atrial size was normal in size. Right Atrium: Right atrial size was normal in size. Pericardium: Trivial pericardial effusion is present. Presence of pericardial fat pad. Mitral Valve: The mitral valve is grossly normal. No evidence of mitral valve regurgitation. Tricuspid Valve: The tricuspid valve is grossly normal. Tricuspid valve regurgitation is trivial. Aortic Valve: The aortic valve is tricuspid. Aortic valve regurgitation is not visualized. No aortic stenosis is present. Pulmonic Valve: The pulmonic valve was grossly normal. Pulmonic valve regurgitation is not visualized. Aorta: The aortic root and ascending aorta are structurally normal, with no evidence of dilitation. Venous: The inferior vena cava is dilated in size with greater than 50% respiratory variability, suggesting right atrial pressure of 8 mmHg. IAS/Shunts: No atrial level shunt detected by  color flow Doppler.  LEFT VENTRICLE PLAX 2D LVIDd:         4.50 cm  Diastology LVIDs:         3.30 cm  LV e' lateral:   12.30 cm/s LV PW:         0.80 cm  LV E/e' lateral: 5.9 LV IVS:        0.70 cm  LV e' medial:    12.90 cm/s LVOT diam:     2.30 cm  LV E/e' medial:  5.6 LV SV:         73.95 ml LV SV Index:   27.35 LVOT Area:     4.15 cm  RIGHT VENTRICLE RV S prime:     15.90 cm/s TAPSE (M-mode): 2.4 cm LEFT ATRIUM             Index       RIGHT ATRIUM           Index LA diam:        2.70 cm 1.56 cm/m  RA Area:     16.70 cm LA Vol (A2C):   36.4 ml 21.03 ml/m RA Volume:   45.70 ml  26.40 ml/m LA Vol (A4C):   38.1 ml 22.01 ml/m LA Biplane Vol: 40.7  ml 23.51 ml/m  AORTIC VALVE LVOT Vmax:   102.00 cm/s LVOT Vmean:  70.000 cm/s LVOT VTI:    0.178 m  AORTA Ao Root diam: 3.20 cm Ao Asc diam:  3.20 cm MITRAL VALVE               TRICUSPID VALVE MV Area (PHT): 4.21 cm    TR Peak grad:   19.4 mmHg MV Decel Time: 180 msec    TR Vmax:        220.00 cm/s MV E velocity: 72.40 cm/s MV Keerat Denicola velocity: 71.10 cm/s  SHUNTS MV E/Angad Nabers ratio:  1.02        Systemic VTI:  0.18 m                            Systemic Diam: 2.30 cm Eleonore Chiquito MD Electronically signed by Eleonore Chiquito MD Signature Date/Time: 09/14/2019/6:14:14 PM    Final         Scheduled Meds: . Chlorhexidine Gluconate Cloth  6 each Topical Q0600  . enoxaparin (LOVENOX) injection  40 mg Subcutaneous Q24H  . FLUoxetine  20 mg Oral Daily  . fluticasone furoate-vilanterol  1 puff Inhalation Daily  . folic acid  1 mg Oral Daily  . gabapentin  300 mg Oral TID  . ipratropium-albuterol  3 mL Nebulization BID  . multivitamin with minerals  1 tablet Oral Daily  . nicotine  21 mg Transdermal Daily  . pantoprazole  40 mg Oral BID  . predniSONE  40 mg Oral Q breakfast  . thiamine  100 mg Oral Daily   Or  . thiamine  100 mg Intravenous Daily  . traZODone  50 mg Oral QHS   Continuous Infusions: . sodium chloride 100 mL/hr at 09/15/19 1230     LOS: 2 days     Time spent: over 30 min    Fayrene Helper, MD [facility]   To contact the attending provider between 7A-7P or the covering provider during after hours 7P-7A, please log into the web site [email or web address] and access using universal [place] password for that web site. If you do not have the password, please call the hospital operator.  09/15/2019, 2:21 PM

## 2019-09-15 NOTE — TOC Initial Note (Signed)
Transition of Care [facility]) - Initial/Assessment Note    Patient Details  Name: Samuel Reyes MRN: [id number] Date of Birth: 13-Jan-1956  Transition of Care [facility]) CM/SW Contact:    Trish Mage, LCSW Phone Number: 09/15/2019, 2:45 PM  Clinical Narrative:  Patient here for hypothermia has been psych cleared, consult put in for substance abuse.  Upon approach, Samuel Reyes is open to engaging in conversation.  When asked of needs, he immediately tells me he needs a MCD card, a SS# card and a Food Stamp card as all those were recently stolen from him.  He also asked about a place to stay. I inquired whether he was looking for treatment or shelter.  He responded that he might be interested in treatment, "but not [facility] because I was there for 54 days and they would not let me talk about my grief at all."   I let him know that was the only rehab I was aware of that he would be eligible for, and he was OK with that. "I've been to [facility] before.  Can you get me in there?"  He is also open to going to [facility] in [place].  Told him I would do my best, and would get him numbers for DSS and Social Security. We determined together that he gets a MCD card mailed monthly, that it goes to his mother's address in [place], and that she gets a ride from his sister to bring it to him when it arrives.  TOC will continue to follow during the course of hospitalization.                Expected Discharge Plan: Homeless Shelter Barriers to Discharge: Other (comment)   Patient Goals and CMS Choice        Expected Discharge Plan and Services Expected Discharge Plan: Homeless Shelter In-house Referral: Clinical Social Work     Living arrangements for the past 2 months: Homeless                                      Prior Living Arrangements/Services Living arrangements for the past 2 months: Homeless Lives with:: Self Patient language and need for interpreter reviewed:: Yes Do you feel  safe going back to the place where you live?: No   States he needs to know he has a bed at a shelter  Need for Family Participation in Patient Care: Yes (Comment) Care giver support system in place?: Yes (comment)   Criminal Activity/Legal Involvement Pertinent to Current Situation/Hospitalization: No - Comment as needed  Activities of Daily Living Home Assistive Devices/Equipment: None ADL Screening (condition at time of admission) Patient's cognitive ability adequate to safely complete daily activities?: Yes Is the patient deaf or have difficulty hearing?: No Does the patient have difficulty seeing, even when wearing glasses/contacts?: No Does the patient have difficulty concentrating, remembering, or making decisions?: No Patient able to express need for assistance with ADLs?: Yes Does the patient have difficulty dressing or bathing?: No Independently performs ADLs?: Yes (appropriate for developmental age) Does the patient have difficulty walking or climbing stairs?: No Weakness of Legs: Both Weakness of Arms/Hands: Both  Permission Sought/Granted                  Emotional Assessment Appearance:: Appears older than stated age Attitude/Demeanor/Rapport: Engaged Affect (typically observed): Appropriate Orientation: : Oriented to Self, Oriented to Place, Oriented  to Situation Alcohol / Substance Use: Alcohol Use    Admission diagnosis:  ETOH abuse [F10.10] Homeless [Z59.0] Hypothermia, initial encounter [T68.XXXA] Alcoholic intoxication without complication ([place]) 123456 Hypothermia due to exposure [T68.XXXA] Patient Active Problem List   Diagnosis Date Noted  . Hypothermia due to exposure 09/13/2019  . Hypothermia 08/01/2019  . QT prolongation 09/09/2018  . Overdose 09/06/2018  . Alcohol withdrawal ([place]) 08/08/2017  . Homelessness 06/24/2017  . Lung nodule 09/03/2016  . Tobacco abuse 09/03/2016  . Insomnia 09/03/2016  . Malnutrition of moderate degree  08/04/2016  . Atrial tachycardia ([place]) 08/02/2016  . Impaired glucose tolerance 08/02/2016  . Alcohol use with alcohol-induced mood disorder ([place]) 12/12/2015  . Major depressive disorder, recurrent episode, moderate with anxious distress ([place]) 10/30/2015  . Alcohol use disorder, severe, dependence ([place]) 10/29/2015  . Suicidal ideation 09/17/2015  . Alcohol intoxication ([place]) 09/17/2015  . Benign essential HTN 09/17/2015  . COPD (chronic obstructive pulmonary disease) ([place]) 02/17/2013  . GERD (gastroesophageal reflux disease) 02/17/2013   PCP:  [name], NP Pharmacy:   [facility], [place] [address] Phone: [phone] Fax: [phone]  [id number] - [place], [place] - [address] [place] [address] Phone: [phone] Fax: [phone]  [id number] [place] [place] [address] [place] [address] Phone: [phone] Fax: [phone]     Social Determinants of Health (SDOH) Interventions    Readmission Risk Interventions No flowsheet data found.

## 2019-09-15 NOTE — Evaluation (Signed)
Order received for swallow evaluation.  Pt saw an SLP yesterday for clinical swallow evaluation.  Per BSE review, pt was aware of many of his esophageal/reflux precautions per his discussion with SLP.  Please see full note in chart.    Per chart review, pt has a h/o chronic esophageal dysphagia *Barrett's, hiatal hernia, esophagitis noted on endoscopy in 2015.     MBS completed 02/2019 showed normal oropharyngeal swallow ability without aspiration/penetration nor pharyngeal residuals/retention.    Given chronicity of dysphagia, negative CXR and education completed - evaluation at this time does not appear warranted.  Please consider a dedicated esophageal work-up if deem indicated. If SLP needed again, please re-consult.  Thanks.   Kathleen Lime, MS [place] Office [phone]

## 2019-09-15 NOTE — Plan of Care (Signed)
?  Problem: Coping: ?Goal: Level of anxiety will decrease ?Outcome: Progressing ?  ?Problem: Safety: ?Goal: Ability to remain free from injury will improve ?Outcome: Progressing ?  ?

## 2019-09-16 ENCOUNTER — Inpatient Hospital Stay (HOSPITAL_COMMUNITY): Payer: Medicaid Other

## 2019-09-16 DIAGNOSIS — F1020 Alcohol dependence, uncomplicated: Secondary | ICD-10-CM

## 2019-09-16 DIAGNOSIS — Z720 Tobacco use: Secondary | ICD-10-CM

## 2019-09-16 DIAGNOSIS — I10 Essential (primary) hypertension: Secondary | ICD-10-CM

## 2019-09-16 DIAGNOSIS — F10929 Alcohol use, unspecified with intoxication, unspecified: Secondary | ICD-10-CM

## 2019-09-16 DIAGNOSIS — J449 Chronic obstructive pulmonary disease, unspecified: Secondary | ICD-10-CM

## 2019-09-16 DIAGNOSIS — Z590 Homelessness: Secondary | ICD-10-CM

## 2019-09-16 DIAGNOSIS — K219 Gastro-esophageal reflux disease without esophagitis: Secondary | ICD-10-CM

## 2019-09-16 DIAGNOSIS — F331 Major depressive disorder, recurrent, moderate: Secondary | ICD-10-CM

## 2019-09-16 MED ORDER — TRAZODONE HCL 100 MG PO TABS
100.0000 mg | ORAL_TABLET | Freq: Every day | ORAL | Status: DC
  Administered 2019-09-16 – 2019-09-22 (×7): 100 mg via ORAL
  Filled 2019-09-16 (×7): qty 1

## 2019-09-16 MED ORDER — MAGNESIUM SULFATE 2 GM/50ML IV SOLN
2.0000 g | Freq: Once | INTRAVENOUS | Status: AC
  Administered 2019-09-16: 2 g via INTRAVENOUS
  Filled 2019-09-16: qty 50

## 2019-09-16 NOTE — Consult Note (Signed)
Telepsych Consultation   Reason for Consult:  "Pt notes to me that suicidal thoughts don't really go away  Didn't directly answer question of whether he'd act on those thoughts, but noted he's acted on them before" Referring Physician:  Dr. [place] Location of Patient: [facility] Location of Provider: [facility]  Patient Identification: Samuel Reyes MRN:  [id number] Principal Diagnosis: Hypothermia Diagnosis:  Principal Problem:   Hypothermia Active Problems:   COPD (chronic obstructive pulmonary disease) (HCC)   GERD (gastroesophageal reflux disease)   Alcohol intoxication ([place])   Benign essential HTN   Alcohol use disorder, severe, dependence ([place])   Major depressive disorder, recurrent episode, moderate with anxious distress ([place])   Tobacco abuse   Homelessness   Hypothermia due to exposure   Total Time spent with patient: 30 minutes  Subjective:   Samuel Reyes is a 64 y.o. male patient.  Patient assessed by nurse practitioner.  Patient alert and oriented, answers appropriately.  Patient states "I want to get into somewhere to get me straight for the long-term, I want to go to [place] I have been there before."  Patient states "I drink a lot of beer, I know I cannot drink and I have stopped before."  Patient reports his mother is supportive but lives with his sister so he is unable to reside with her at this time.  Patient reports he is currently homeless.  Patient denies access to weapons. Patient reports chronic suicidal ideations.  Patient denies current suicidal ideations, patient denies plan or intent to hurt self.  Patient denies homicidal ideations.  Patient denies auditory and visual hallucinations.  Patient reports stressor "I lost my wife."  HPI: Patient admitted with hypothermia and chronic homelessness as well as chronic alcohol use disorder.  Past Psychiatric History: Alcohol use disorder, major depressive disorder,  Risk to Self:   Denies Risk to Others:  Denies Prior Inpatient Therapy:  Yes Prior Outpatient Therapy:  Yes  Past Medical History:  Past Medical History:  Diagnosis Date  . Alcoholism ([place])   . Barrett's esophagus   . CAD (coronary artery disease) 2002   MI, no intervention required  . COPD (chronic obstructive pulmonary disease) (HCC)    not on home O2  . Depression   . DTs (delirium tremens) ([place])   . GERD (gastroesophageal reflux disease)   . Headache   . Homeless   . Hypertension   . Stroke [facility]) 2006    Past Surgical History:  Procedure Laterality Date  . BACK SURGERY     3 cervical spine surgeries C4-C5 fused  . COLONOSCOPY N/A 01/04/2014   Procedure: COLONOSCOPY;  Surgeon: Danie Binder, MD;  Location: [facility];  Service: Endoscopy;  Laterality: N/A;  1:45  . ESOPHAGOGASTRODUODENOSCOPY N/A 01/04/2014   Procedure: ESOPHAGOGASTRODUODENOSCOPY (EGD);  Surgeon: Danie Binder, MD;  Location: [facility];  Service: Endoscopy;  Laterality: N/A;  . FINGER SURGERY Left    2nd, 3rd, & 4th fingers were cut off by table saw and reattached  . GASTRECTOMY    . HERNIA REPAIR    . INCISIONAL HERNIA REPAIR N/A 01/20/2014   Procedure: LAPAROSCOPIC RECURRENT  INCISIONAL HERNIA with mesh;  Surgeon: Edward Jolly, MD;  Location: [facility] OR[facility];  Service: General;  Laterality: N/A;  . rt knee arthroscopic surgery    . SHOULDER SURGERY Bilateral    3 surgeries on on left, 2 surgeries on right    Family History:  Family History  Problem Relation  Age of Onset  . Cancer Father        bone  . Cancer Brother        lungs  . Stroke Maternal Grandmother   . Asthma Son        died at age 86 in his sleep   . Spina bifida Son        died at age 35   . Dementia Mother   . Colon cancer Neg Hx    Family Psychiatric  History: Denies Social History:  Social History   Substance and Sexual Activity  Alcohol Use Yes  . Alcohol/week: 15.0 standard drinks  . Types: 15 Cans of beer per week   Comment: last  drink 2 days ago     Social History   Substance and Sexual Activity  Drug Use No   Comment: denied using any drugs    Social History   Socioeconomic History  . Marital status: Widowed    Spouse name: Not on file  . Number of children: 3  . Years of education: Not on file  . Highest education level: Not on file  Occupational History  . Occupation: Disability  Tobacco Use  . Smoking status: Current Every Day Smoker    Packs/day: 1.00    Years: 52.00    Pack years: 52.00    Types: Cigarettes    Start date: 07/30/1966  . Smokeless tobacco: Never Used  . Tobacco comment: uses nicotine patch  Substance and Sexual Activity  . Alcohol use: Yes    Alcohol/week: 15.0 standard drinks    Types: 15 Cans of beer per week    Comment: last drink 2 days ago  . Drug use: No    Comment: denied using any drugs  . Sexual activity: Not Currently  Other Topics Concern  . Not on file  Social History Narrative  . Not on file   Social Determinants of Health   Financial Resource Strain:   . Difficulty of Paying Living Expenses: Not on file  Food Insecurity:   . Worried About Charity fundraiser in the Last Year: Not on file  . Ran Out of Food in the Last Year: Not on file  Transportation Needs:   . Lack of Transportation (Medical): Not on file  . Lack of Transportation (Non-Medical): Not on file  Physical Activity:   . Days of Exercise per Week: Not on file  . Minutes of Exercise per Session: Not on file  Stress:   . Feeling of Stress : Not on file  Social Connections:   . Frequency of Communication with Friends and Family: Not on file  . Frequency of Social Gatherings with Friends and Family: Not on file  . Attends Religious Services: Not on file  . Active Member of Clubs or Organizations: Not on file  . Attends Archivist Meetings: Not on file  . Marital Status: Not on file   Additional Social History:    Allergies:   Allergies  Allergen Reactions  . Bee Venom  Anaphylaxis  . Penicillins Rash    Has patient had a PCN reaction causing immediate rash, facial/tongue/throat swelling, SOB or lightheadedness with hypotension: {Yes Has patient had a PCN reaction causing severe rash involving mucus membranes or skin necrosis: YES Has patient had a PCN reaction that required hospitalization Yes Has patient had a PCN reaction occurring within the last 10 years: YES If all of the above answers are "NO", then may proceed with Cephalosporin use.   Marland Kitchen  Vancomycin Tinitus    Pt states he "almost went deaf" - does not want to take vanc    Labs:  Results for orders placed or performed during the hospital encounter of 09/13/19 (from the past 48 hour(s))  CBC with Differential/Platelet     Status: Abnormal   Collection Time: 09/15/19  2:37 AM  Result Value Ref Range   WBC 11.1 (H) 4.0 - 10.5 K/uL   RBC 3.78 (L) 4.22 - 5.81 MIL/uL   Hemoglobin 10.4 (L) 13.0 - 17.0 g/dL   HCT 32.7 (L) 39.0 - 52.0 %   MCV 86.5 80.0 - 100.0 fL   MCH 27.5 26.0 - 34.0 pg   MCHC 31.8 30.0 - 36.0 g/dL   RDW 20.6 (H) 11.5 - 15.5 %   Platelets 233 150 - 400 K/uL   nRBC 0.0 0.0 - 0.2 %   Neutrophils Relative % 78 %   Neutro Abs 8.5 (H) 1.7 - 7.7 K/uL   Lymphocytes Relative 12 %   Lymphs Abs 1.3 0.7 - 4.0 K/uL   Monocytes Relative 10 %   Monocytes Absolute 1.2 (H) 0.1 - 1.0 K/uL   Eosinophils Relative 0 %   Eosinophils Absolute 0.0 0.0 - 0.5 K/uL   Basophils Relative 0 %   Basophils Absolute 0.0 0.0 - 0.1 K/uL   Immature Granulocytes 0 %   Abs Immature Granulocytes 0.04 0.00 - 0.07 K/uL    Comment: Performed at [facility], [place] [address] [place], [place] [address]  Comprehensive metabolic panel     Status: Abnormal   Collection Time: 09/15/19  2:37 AM  Result Value Ref Range   Sodium 138 135 - 145 mmol/L   Potassium 3.7 3.5 - 5.1 mmol/L   Chloride 106 98 - 111 mmol/L   CO2 24 22 - 32 mmol/L   Glucose, Bld 108 (H) 70 - 99 mg/dL   BUN 6 (L) 8 - 23 mg/dL    Creatinine, Ser 0.74 0.61 - 1.24 mg/dL   Calcium 8.0 (L) 8.9 - 10.3 mg/dL   Total Protein 6.0 (L) 6.5 - 8.1 g/dL   Albumin 2.8 (L) 3.5 - 5.0 g/dL   AST 63 (H) 15 - 41 U/L   ALT 60 (H) 0 - 44 U/L   Alkaline Phosphatase 79 38 - 126 U/L   Total Bilirubin 0.4 0.3 - 1.2 mg/dL   GFR calc non Af Amer >60 >60 mL/min   GFR calc Af Amer >60 >60 mL/min   Anion gap 8 5 - 15    Comment: Performed at [facility], [place] [address] [place], [place] [address]  Magnesium     Status: Abnormal   Collection Time: 09/15/19  2:37 AM  Result Value Ref Range   Magnesium 1.5 (L) 1.7 - 2.4 mg/dL    Comment: Performed at [facility], [place] [address], [place], [place] [address]  Phosphorus     Status: None   Collection Time: 09/15/19  2:37 AM  Result Value Ref Range   Phosphorus 3.0 2.5 - 4.6 mg/dL    Comment: Performed at [facility], [place] [address], [place],  [address]    Medications:  Current Facility-Administered Medications  Medication Dose Route Frequency Provider Last Rate Last Admin  . albuterol (PROVENTIL) (2.5 MG/3ML) 0.083% nebulizer solution 2.5 mg  2.5 mg Nebulization Q6H PRN Donnamae Jude, MD      . Chlorhexidine Gluconate Cloth 2 % PADS 6 each  6 each  Topical Q0600 Donnamae Jude, MD   6 each at 09/15/19 0600  . cyclobenzaprine (FLEXERIL) tablet 10 mg  10 mg Oral TID PRN Donnamae Jude, MD   10 mg at 09/14/19 1537  . enoxaparin (LOVENOX) injection 40 mg  40 mg Subcutaneous Q24H Donnamae Jude, MD   40 mg at 09/16/19 [id number]  . FLUoxetine (PROZAC) capsule 20 mg  20 mg Oral Daily Elodia Florence., MD   20 mg at 09/16/19 [phone]  . fluticasone furoate-vilanterol (BREO ELLIPTA) 200-25 MCG/INH 1 puff  1 puff Inhalation Daily Donnamae Jude, MD   1 puff at 09/16/19 0857  . folic acid (FOLVITE) tablet 1 mg  1 mg Oral Daily Donnamae Jude, MD   1 mg at 09/16/19 [id number]  . gabapentin (NEURONTIN) capsule 300 mg  300 mg Oral TID Donnamae Jude, MD   300 mg at 09/16/19 0852  . guaiFENesin-dextromethorphan (ROBITUSSIN DM) 100-10 MG/5ML syrup 5 mL  5 mL Oral Q4H PRN Elodia Florence., MD   5 mL at 09/16/19 [phone]  . ibuprofen (ADVIL) tablet 600 mg  600 mg Oral Q6H PRN Donnamae Jude, MD   600 mg at 09/16/19 0649  . ipratropium-albuterol (DUONEB) 0.5-2.5 (3) MG/3ML nebulizer solution 3 mL  3 mL Nebulization BID Elodia Florence., MD   3 mL at 09/16/19 0857  . multivitamin with minerals tablet 1 tablet  1 tablet Oral Daily Donnamae Jude, MD   1 tablet at 09/16/19 [phone]  . nicotine (NICODERM CQ - dosed in mg/24 hours) patch 21 mg  21 mg Transdermal Daily Donnamae Jude, MD   21 mg at 09/16/19 0854  . pantoprazole (PROTONIX) EC tablet 40 mg  40 mg Oral BID Donnamae Jude, MD   40 mg at 09/16/19 0851  . predniSONE (DELTASONE) tablet 40 mg  40 mg Oral Q breakfast Elodia Florence., MD   40 mg at 09/16/19 [phone]  . senna-docusate (Senokot-S) tablet 1 tablet  1 tablet Oral QHS PRN Donnamae Jude, MD      . thiamine tablet 100 mg  100 mg Oral Daily Donnamae Jude, MD   100 mg at 09/16/19 [id number]   Or  . thiamine (B-1) injection 100 mg  100 mg Intravenous Daily Donnamae Jude, MD      . traZODone (DESYREL) tablet 50 mg  50 mg Oral QHS Elodia Florence., MD   50 mg at 09/15/19 2154    Musculoskeletal: Strength & Muscle Tone: within normal limits Gait & Station: normal Patient leans: N/A  Psychiatric Specialty Exam: Physical Exam  Nursing note and vitals reviewed. Constitutional: He is oriented to person, place, and time. He appears well-developed.  HENT:  Head: Normocephalic.  Cardiovascular: Normal rate.  Respiratory: Effort normal.  Neurological: He is alert and oriented to person, place, and time.  Psychiatric: His speech is normal and behavior is normal. Judgment and thought content normal. Cognition and memory are normal. He exhibits a depressed mood.    Review of Systems  Constitutional: Negative.   HENT: Negative.    Eyes: Negative.   Respiratory: Negative.   Cardiovascular: Negative.   Gastrointestinal: Negative.   Genitourinary: Negative.   Musculoskeletal: Negative.   Skin: Negative.   Neurological: Negative.     Blood pressure (!) 170/93, pulse 86, temperature 98.3 F (36.8 C), resp. rate 20, height 5\' 4"  (1.626 m), weight 68 kg, SpO2 94 %.Body mass index is  25.75 kg/m.  General Appearance: Casual and Fairly Groomed  Eye Contact:  Good  Speech:  Clear and Coherent and Normal Rate  Volume:  Normal  Mood:  Depressed  Affect:  Congruent and Depressed  Thought Process:  Coherent, Goal Directed and Descriptions of Associations: Intact  Orientation:  Full (Time, Place, and Person)  Thought Content:  WDL and Logical  Suicidal Thoughts:  No  Homicidal Thoughts:  No  Memory:  Immediate;   Good Recent;   Good Remote;   Good  Judgement:  Good  Insight:  Good  Psychomotor Activity:  Normal  Concentration:  Concentration: Good and Attention Span: Good  Recall:  Good  Fund of Knowledge:  Good  Language:  Good  Akathisia:  No  Handed:  Right  AIMS (if indicated):     Assets:  Communication Skills Desire for Improvement Financial Resources/Insurance Leisure Time Physical Health Resilience Social Support  ADL's:  Intact  Cognition:  WNL  Sleep:        Treatment Plan Summary: Case discussed with Dr Dwyane Dee.  Patient cleared by psychiatry. Recommend social work consult related to homeless concerns.    Disposition: No evidence of imminent risk to self or others at present.   Patient does not meet criteria for psychiatric inpatient admission. Supportive therapy provided about ongoing stressors. Discussed crisis plan, support from social network, calling 911, coming to the Emergency Department, and calling Suicide Hotline.  This service was provided via telemedicine using a 2-way, interactive audio and video technology.  Names of all persons participating in this telemedicine service  and their role in this encounter. Name: Samuel Reyes Role: Patient  Name: Letitia Libra Role: [place], [place] 09/16/2019 10:56 AM

## 2019-09-16 NOTE — Evaluation (Signed)
Physical Therapy Evaluation Patient Details Name: Samuel Reyes MRN: [id number] DOB: 19-Dec-1955 Today's Date: 09/16/2019   History of Present Illness  64 y.o. male with medical history significant of COPD, HTN, GERD, CVA, depression, CAD, alcoholism, h/o DT's, Barrett's esophagus found down near [employer] [facility] in a yard. Was wet and cold and noted to have core temp of 87 degrees. In ED treated with [facility], warmed fluids. Noted to have alcohol level of 327 and lactic acid of 2.8 without concomitant leukocytosis. Recent admission for the same  08/01/2019, with transfer to behavioral health hospital for 4 days of inpatient prior to d/c. He has known stressors including loss of child and wife and homelessness and ongoing alcohol use..  Clinical Impression  Pt admitted with above diagnosis.  Pt currently with functional limitations due to the deficits listed below (see PT Problem List). Pt will benefit from skilled PT to increase their independence and safety with mobility to allow discharge to the venue listed below.  Pt assisted with sitting and standing EOB.  Pt reports inability to ambulate due to dizziness however he states this is chronic and usually lasts a few minutes with standing.  BP 138/81 mmHg HR 77 supine and 131/81 mmHg HR 91 bpm with sitting.  Unable to obtain in standing due to wet linen and pt urinating on floor with standing.  Pt states, " I'm [place]."  Pt would benefit from SNF upon d/c.     Follow Up Recommendations SNF;Supervision for mobility/OOB    Equipment Recommendations  Rolling walker with 5" wheels    Recommendations for Other Services       Precautions / Restrictions Precautions Precautions: Fall      Mobility  Bed Mobility Overal bed mobility: Needs Assistance Bed Mobility: Supine to Sit;Sit to Supine     Supine to sit: Min guard Sit to supine: Min guard      Transfers Overall transfer level: Needs assistance Equipment  used: Rolling walker (2 wheeled) Transfers: Sit to/from Stand Sit to Stand: Min assist         General transfer comment: assist for steadying upon rise; performed x3, pt with urine saturated sheets; reports dizziness with standing however able to stand 3 minutes for sheets to be changed and hygiene  Ambulation/Gait                Stairs            Wheelchair Mobility    Modified Rankin (Stroke Patients Only)       Balance Overall balance assessment: Needs assistance         Standing balance support: Bilateral upper extremity supported Standing balance-Leahy Scale: Poor Standing balance comment: requires UE support, does not tolerate a challenge                             Pertinent Vitals/Pain Pain Assessment: No/denies pain Pain Intervention(s): Repositioned;Monitored during session    Home Living Family/patient expects to be discharged to:: Shelter/Homeless                      Prior Function Level of Independence: Independent with assistive device(s)         Comments: Reports history of using rollator but rollator got taken     Hand Dominance        Extremity/Trunk Assessment        Lower Extremity Assessment  Lower Extremity Assessment: Generalized weakness    Cervical / Trunk Assessment Cervical / Trunk Assessment: Normal  Communication   Communication: No difficulties  Cognition Arousal/Alertness: Awake/alert Behavior During Therapy: WFL for tasks assessed/performed Overall Cognitive Status: Within Functional Limits for tasks assessed                                        General Comments      Exercises     Assessment/Plan    PT Assessment Patient needs continued PT services  PT Problem List Decreased strength;Decreased mobility;Decreased activity tolerance;Decreased balance;Decreased knowledge of use of DME       PT Treatment Interventions DME instruction;Therapeutic  exercise;Gait training;Functional mobility training;Therapeutic activities;Patient/family education;Balance training    PT Goals (Current goals can be found in the Care Plan section)  Acute Rehab PT Goals PT Goal Formulation: With patient Time For Goal Achievement: 09/30/19 Potential to Achieve Goals: Fair    Frequency Min 2X/week   Barriers to discharge        Co-evaluation               AM-PAC PT "6 Clicks" Mobility  Outcome Measure Help needed turning from your back to your side while in a flat bed without using bedrails?: A Little Help needed moving from lying on your back to sitting on the side of a flat bed without using bedrails?: A Little Help needed moving to and from a bed to a chair (including a wheelchair)?: A Little Help needed standing up from a chair using your arms (e.g., wheelchair or bedside chair)?: A Little Help needed to walk in hospital room?: A Little Help needed climbing 3-5 steps with a railing? : A Little 6 Click Score: 18    End of Session   Activity Tolerance: Patient tolerated treatment well Patient left: in bed;with call bell/phone within reach;with bed alarm set;with nursing/sitter in room   PT Visit Diagnosis: Other abnormalities of gait and mobility (R26.89);Unsteadiness on feet (R26.81)    Time: [id number] PT Time Calculation (min) (ACUTE ONLY): 20 min   Charges:   PT Evaluation $PT Eval Low Complexity: 1 Low         Kati PT, DPT Acute Rehabilitation Services Office: [phone]  Chamika Cunanan,KATHrine E 09/16/2019, 1:48 PM

## 2019-09-16 NOTE — Progress Notes (Signed)
PROGRESS NOTE    Marki Corvin  [id number] DOB: 07/09/1956 DOA: 09/13/2019 PCP: [name], NP    Brief Narrative:   Ronold Glomb is a 64 y.o.malewith medical history significant ofCOPD, HTN, GERD, CVA, depression, CAD, alcoholism, h/o DT's, Barrett's esophagus found down near [place] [facility] in a yard. Was wet and cold and noted to have core temp of 87 degrees.   In ED treated with [facility] (2-Rh), warmed fluids. Noted to have alcohol level of 327 and lactic acid of 2.8 without concomitant leukocytosis. Recent admission for the same 08/01/2019, with transfer to behavioral health hospital for 4 days of inpatient prior to discharge. He has known stressors including loss of child/wife, homelessness and ongoing alcohol use.   Assessment & Plan:   Principal Problem:   Hypothermia Active Problems:   COPD (chronic obstructive pulmonary disease) (HCC)   GERD (gastroesophageal reflux disease)   Alcohol intoxication ([place])   Benign essential HTN   Alcohol use disorder, severe, dependence (HCC)   Major depressive disorder, recurrent episode, moderate with anxious distress ([place])   Tobacco abuse   Homelessness   Hypothermia due to exposure   Hypothermia: Resolved Etiology likely secondary to cold exposure as he was found outside for likely prolonged period of time frame given his homelessness.  Was actively rewarmed and fluid resuscitated on presentation.  Urinalysis not suggestive of infection and without leukocytosis.  Urine culture with no growth. --Blood cultures x2: No growth x2 days --Continue monitor vital signs closely  Acute EtOH intoxication Patient with elevated EtOH level of 327 on admission.  History of significant DTs. --CIWAA protocol --Thiamine, folate, multivitamin  Depression/suicidal ideation Patient with long history of depression and chronic suicidal ideations.  Recent admission to behavioral health Hospital early January, states thoughts of suicidal  ideation never really go away.  Has reportedly thoughts of intense with cutting wrists and running into traffic.  Has been seen by psychiatry on 2 occasions during this hospitalization on 09/14/2019 and 09/16/2019; and cleared psychiatrically for discharge. --Gabapentin 300 mg p.o. 3 times daily --Prozac 10 mg p.o. daily --Would benefit from continued outpatient substance abuse program, social work for coordination  Chest pain Hx CAD/MI Patient reports chest pain on presentation, was noted to be significantly hypothermic secondary to cold exposure.  EKG without concerning ST elevation/depression or T wave inversions.  Troponin was minimally elevated which down trended which is inconsistent with ACS.  TTE with normal EF, no regional wall motion abnormalities.  Currently chest pain-free. --Continue to monitor on telemetry  Acute COPD exacerbation Currently oxygenating well on room air. --Breo Ellipta 1 puff daily --albuterol/duonebs prn --Continue prednisone 40 mg p.o. daily, plan 5-day course  GERD: Continue PPI  Essential hypertension Was previously on Norvasc, held secondary to hypotension on presentation with associated hypothermia. --Continue to monitor BP closely, will restart Norvasc when appropriate  Elevated LFTs Etiology likely secondary to continued alcohol abuse.  Acute hepatitis panel negative.  LFTs improving.  Hypomagnesemia Magnesium 1.5, will replete.  Potassium within normal limits. --Repeat electrolytes in a.m. to include magnesium  Leukocytosis Etiology likely secondary to steroid-induced from treatment of COPD exacerbation as above.  Tobacco use disorder: Continue nicotine patch   DVT prophylaxis: Lovenox Code Status: Full code Family Communication: Discussed with patient extensively at bedside Disposition Plan: To be determined, complicated as patient is homeless, PT now recommending SNF, will have social work evaluate whether to discharge to shelter versus  SNF.   Consultants:   Psychiatry; 2/15, 2/17 - signed off;  cleared for discharge by psychiatry  Procedures:   TTE 2/15: EF 60-65%, normal LV wall function, RV systolic function normal, IVC dilated.  Antimicrobials:   None   Subjective: Patient seen and examined at bedside, feels worse than yesterday and having difficulty walking, no specific complaint though.  Seen by psychiatry once again today for concerns of severe depression and suicidal ideations, psychiatry NP states patient is well-known to their service that he has chronic suicidal thoughts but without intent and is cleared for discharge on their behalf.  Patient without any other specific complaints at this time.  Specifically denies headache, no visual changes, no chest pain, palpitations, no nausea, vomiting, diarrhea, no cough, no weakness, no fatigue.  No acute events overnight per nursing staff.  Objective: Vitals:   09/16/19 0529 09/16/19 0857 09/16/19 0859 09/16/19 1315  BP: (!) 170/93   120/62  Pulse: 86   91  Resp: 20   18  Temp: 98.3 F (36.8 C)   98.3 F (36.8 C)  TempSrc:    Oral  SpO2: 97% 94% 94% 98%  Weight:      Height:        Intake/Output Summary (Last 24 hours) at 09/16/2019 1438 Last data filed at 09/16/2019 1317 Gross per 24 hour  Intake 1530 ml  Output 4000 ml  Net -2470 ml   Filed Weights   09/13/19 0907  Weight: 68 kg    Examination:  General exam: Appears calm and comfortable, disheveled in appearance Respiratory system: Clear to auscultation. Respiratory effort normal.  No accessory muscle use, oxygenating well on room air Cardiovascular system: S1 & S2 heard, RRR. No JVD, murmurs, rubs, gallops or clicks. No pedal edema. Gastrointestinal system: Abdomen is nondistended, soft and nontender. No organomegaly or masses felt. Normal bowel sounds heard. Central nervous system: Alert and oriented. No focal neurological deficits. Extremities: Symmetric 5 x 5 power. Skin: No rashes,  lesions or ulcers Psychiatry: Judgement and insight appear poor.  Depressed mood & flat affect.     Data Reviewed: I have personally reviewed following labs and imaging studies  CBC: Recent Labs  Lab 09/13/19 0354 09/13/19 0828 09/14/19 0327 09/15/19 0237  WBC 5.7 4.3 7.2 11.1*  NEUTROABS 3.5  --   --  8.5*  HGB 13.9 9.9* 10.0* 10.4*  HCT 43.6 31.5* 32.7* 32.7*  MCV 85.5 86.1 88.6 86.5  PLT 242 204 202 0000000   Basic Metabolic Panel: Recent Labs  Lab 09/13/19 0354 09/13/19 0828 09/13/19 1302 09/14/19 0327 09/15/19 0237  NA 140  --  138 139 138  K 3.7  --  3.3* 3.7 3.7  CL 101  --  108 112* 106  CO2 26  --  20* 20* 24  GLUCOSE 125*  --  94 84 108*  BUN 7*  --  7* 8 6*  CREATININE 0.62 0.54* 0.62 0.68 0.74  CALCIUM 8.9  --  7.0* 7.2* 8.0*  MG  --   --   --   --  1.5*  PHOS  --   --   --   --  3.0   GFR: Estimated Creatinine Clearance: 78.1 mL/min (by C-G formula based on SCr of 0.74 mg/dL). Liver Function Tests: Recent Labs  Lab 09/13/19 0354 09/14/19 0849 09/15/19 0237  AST 87* 114* 63*  ALT 66* 67* 60*  ALKPHOS 126 83 79  BILITOT 0.5 0.7 0.4  PROT 7.7 5.5* 6.0*  ALBUMIN 3.6 2.5* 2.8*   No results for input(s): LIPASE, AMYLASE in  the last 168 hours. No results for input(s): AMMONIA in the last 168 hours. Coagulation Profile: Recent Labs  Lab 09/14/19 0327  INR 0.9   Cardiac Enzymes: No results for input(s): CKTOTAL, CKMB, CKMBINDEX, TROPONINI in the last 168 hours. BNP (last 3 results) No results for input(s): PROBNP in the last 8760 hours. HbA1C: No results for input(s): HGBA1C in the last 72 hours. CBG: No results for input(s): GLUCAP in the last 168 hours. Lipid Profile: No results for input(s): CHOL, HDL, LDLCALC, TRIG, CHOLHDL, LDLDIRECT in the last 72 hours. Thyroid Function Tests: No results for input(s): TSH, T4TOTAL, FREET4, T3FREE, THYROIDAB in the last 72 hours. Anemia Panel: No results for input(s): VITAMINB12, FOLATE, FERRITIN,  TIBC, IRON, RETICCTPCT in the last 72 hours. Sepsis Labs: Recent Labs  Lab 09/13/19 0354 09/13/19 1302  LATICACIDVEN 2.8* 1.5    Recent Results (from the past 240 hour(s))  SARS CORONAVIRUS 2 (TAT 6-24 HRS) Nasopharyngeal Nasopharyngeal Swab     Status: None   Collection Time: 09/13/19  4:55 AM   Specimen: Nasopharyngeal Swab  Result Value Ref Range Status   SARS Coronavirus 2 NEGATIVE NEGATIVE Final    Comment: (NOTE) SARS-CoV-2 target nucleic acids are NOT DETECTED. The SARS-CoV-2 RNA is generally detectable in upper and lower respiratory specimens during the acute phase of infection. Negative results do not preclude SARS-CoV-2 infection, do not rule out co-infections with other pathogens, and should not be used as the sole basis for treatment or other patient management decisions. Negative results must be combined with clinical observations, patient history, and epidemiological information. The expected result is Negative. Fact Sheet for Patients: SugarRoll.be Fact Sheet for Healthcare Providers: https://www.woods-mathews.com/ This test is not yet approved or cleared by the [place] FDA and  has been authorized for detection and/or diagnosis of SARS-CoV-2 by FDA under an Emergency Use Authorization (EUA). This EUA will remain  in effect (meaning this test can be used) for the duration of the COVID-19 declaration under Section 56 4(b)(1) of the Act, 21 U.S.C. section 360bbb-3(b)(1), unless the authorization is terminated or revoked sooner. Performed at [facility], [place] [address], [place], [place] [address]   MRSA PCR Screening     Status: None   Collection Time: 09/13/19 12:50 PM   Specimen: Nasal Mucosa; Nasopharyngeal  Result Value Ref Range Status   MRSA by PCR NEGATIVE NEGATIVE Final    Comment:        The GeneXpert MRSA Assay (FDA approved for NASAL specimens only), is one component of a comprehensive MRSA  colonization surveillance program. It is not intended to diagnose MRSA infection nor to guide or monitor treatment for MRSA infections. Performed at [facility], [place] [address] [place], [place] [address]   Culture, Urine     Status: None   Collection Time: 09/13/19  4:12 PM   Specimen: Urine, Random  Result Value Ref Range Status   Specimen Description   Final    URINE, RANDOM Performed at [facility] [address] [place], [place] [address]    Special Requests   Final    Normal Performed at [facility], [place] [address], [place], [place] [address]    Culture   Final    NO GROWTH Performed at [facility], [place] [address] [place],  [address]    Report Status 09/14/2019 FINAL  Final  Culture, blood (routine x 2)     Status: None (Preliminary result)  Collection Time: 09/13/19  4:53 PM   Specimen: BLOOD  Result Value Ref Range Status   Specimen Description   Final    BLOOD RIGHT ARM Performed at [facility] [address] [place], [place] [address]    Special Requests   Final    BOTTLES DRAWN AEROBIC AND ANAEROBIC Blood Culture adequate volume Performed at [facility] [address], [place], [place] [address]    Culture   Final    NO GROWTH 2 DAYS Performed at [facility] [address], [place], [place] [address]    Report Status PENDING  Incomplete  Culture, blood (routine x 2)     Status: None (Preliminary result)   Collection Time: 09/13/19  4:56 PM   Specimen: BLOOD  Result Value Ref Range Status   Specimen Description   Final    BLOOD RIGHT HAND Performed at [facility] [address] [place], [place] [address]    Special Requests   Final    BOTTLES DRAWN AEROBIC AND ANAEROBIC Blood Culture adequate volume Performed at [facility] [address] [place], [place] [address]    Culture   Final     NO GROWTH 2 DAYS Performed at [facility] [address] [place], [place] [address]    Report Status PENDING  Incomplete         Radiology Studies: DG Abd 1 View  Result Date: 09/16/2019 CLINICAL DATA:  Abdominal pain, distention EXAM: ABDOMEN - 1 VIEW COMPARISON:  None. FINDINGS: Gaseous distension of bowel. This is slightly more prominent within small bowel loops. Findings may reflect ileus although small-bowel obstruction is difficult to exclude. No organomegaly or free air. IMPRESSION: Distended gas-filled bowel loops, predominantly small bowel. Differential considerations would include ileus or small bowel obstruction. Electronically Signed   By: Rolm Baptise M.D.   On: 09/16/2019 11:19   DG CHEST PORT 1 VIEW  Result Date: 09/14/2019 CLINICAL DATA:  Aspiration EXAM: PORTABLE CHEST 1 VIEW COMPARISON:  09/13/2019 FINDINGS: Frontal view of the chest demonstrates an unremarkable cardiac silhouette. No airspace disease, effusion, or pneumothorax. No evidence of aspiration. IMPRESSION: 1. No acute intrathoracic process. Electronically Signed   By: Randa Ngo M.D.   On: 09/14/2019 22:28   ECHOCARDIOGRAM COMPLETE  Result Date: 09/14/2019    ECHOCARDIOGRAM REPORT   Patient Name:   CURLEY FALLO Date of Exam: 09/14/2019 Medical Rec #:  [id number]      Height:       64.0 in Accession #:    [id number]     Weight:       150.0 lb Date of Birth:  04-03-1956     BSA:          1.73 m Patient Age:    28 years       BP:           174/87 mmHg Patient Gender: M              HR:           96 bpm. Exam Location:  Inpatient Procedure: 2D Echo, Color Doppler and Cardiac Doppler Indications:    R07.9* Chest pain, unspecified  History:        Patient has prior history of Echocardiogram examinations, most                 recent 08/03/2016. Signs/Symptoms:Chest Pain; Risk  Factors:Hypertension. ETOH and tobacco abuse.  Sonographer:    Raquel Sarna Senior RDCS Referring Phys: [phone] A CALDWELL  [place]  1. Left ventricular ejection fraction, by estimation, is 60 to 65%. The left ventricle has normal function. The left ventricle has no regional wall motion abnormalities. Left ventricular diastolic parameters were normal.  2. Right ventricular systolic function is normal. The right ventricular size is normal. There is normal pulmonary artery systolic pressure. The estimated right ventricular systolic pressure is [id number] mmHg.  3. The mitral valve is grossly normal. No evidence of mitral valve regurgitation.  4. The aortic valve is tricuspid. Aortic valve regurgitation is not visualized. No aortic stenosis is present.  5. The inferior vena cava is dilated in size with >50% respiratory variability, suggesting right atrial pressure of 8 mmHg. Comparison(s): A prior study was performed on 08/03/2016. Prior images unable to be directly viewed, comparison made by report only. No significant change from prior study. FINDINGS  Left Ventricle: Left ventricular ejection fraction, by estimation, is 60 to 65%. The left ventricle has normal function. The left ventricle has no regional wall motion abnormalities. The left ventricular internal cavity size was normal in size. There is  no left ventricular hypertrophy. Left ventricular diastolic parameters were normal. Right Ventricle: The right ventricular size is normal. No increase in right ventricular wall thickness. Right ventricular systolic function is normal. There is normal pulmonary artery systolic pressure. The tricuspid regurgitant velocity is 2.20 m/s, and  with an assumed right atrial pressure of 8 mmHg, the estimated right ventricular systolic pressure is [id number] mmHg. Left Atrium: Left atrial size was normal in size. Right Atrium: Right atrial size was normal in size. Pericardium: Trivial pericardial effusion is present. Presence of pericardial fat pad. Mitral Valve: The mitral valve is grossly normal. No evidence of mitral valve regurgitation. Tricuspid  Valve: The tricuspid valve is grossly normal. Tricuspid valve regurgitation is trivial. Aortic Valve: The aortic valve is tricuspid. Aortic valve regurgitation is not visualized. No aortic stenosis is present. Pulmonic Valve: The pulmonic valve was grossly normal. Pulmonic valve regurgitation is not visualized. Aorta: The aortic root and ascending aorta are structurally normal, with no evidence of dilitation. Venous: The inferior vena cava is dilated in size with greater than 50% respiratory variability, suggesting right atrial pressure of 8 mmHg. IAS/Shunts: No atrial level shunt detected by color flow Doppler.  LEFT VENTRICLE PLAX 2D LVIDd:         4.50 cm  Diastology LVIDs:         3.30 cm  LV e' lateral:   12.30 cm/s LV PW:         0.80 cm  LV E/e' lateral: 5.9 LV IVS:        0.70 cm  LV e' medial:    12.90 cm/s LVOT diam:     2.30 cm  LV E/e' medial:  5.6 LV SV:         73.95 ml LV SV Index:   27.35 LVOT Area:     4.15 cm  RIGHT VENTRICLE RV S prime:     15.90 cm/s TAPSE (M-mode): 2.4 cm LEFT ATRIUM             Index       RIGHT ATRIUM           Index LA diam:        2.70 cm 1.56 cm/m  RA Area:     16.70 cm LA Vol (A2C):   36.4 ml  21.03 ml/m RA Volume:   45.70 ml  26.40 ml/m LA Vol (A4C):   38.1 ml 22.01 ml/m LA Biplane Vol: 40.7 ml 23.51 ml/m  AORTIC VALVE LVOT Vmax:   102.00 cm/s LVOT Vmean:  70.000 cm/s LVOT VTI:    0.178 m  AORTA Ao Root diam: 3.20 cm Ao Asc diam:  3.20 cm MITRAL VALVE               TRICUSPID VALVE MV Area (PHT): 4.21 cm    TR Peak grad:   19.4 mmHg MV Decel Time: 180 msec    TR Vmax:        220.00 cm/s MV E velocity: 72.40 cm/s MV A velocity: 71.10 cm/s  SHUNTS MV E/A ratio:  1.02        Systemic VTI:  0.18 m                            Systemic Diam: 2.30 cm Eleonore Chiquito MD Electronically signed by Eleonore Chiquito MD Signature Date/Time: 09/14/2019/6:14:14 PM    Final         Scheduled Meds: . Chlorhexidine Gluconate Cloth  6 each Topical Q0600  . enoxaparin (LOVENOX)  injection  40 mg Subcutaneous Q24H  . FLUoxetine  20 mg Oral Daily  . fluticasone furoate-vilanterol  1 puff Inhalation Daily  . folic acid  1 mg Oral Daily  . gabapentin  300 mg Oral TID  . ipratropium-albuterol  3 mL Nebulization BID  . multivitamin with minerals  1 tablet Oral Daily  . nicotine  21 mg Transdermal Daily  . pantoprazole  40 mg Oral BID  . predniSONE  40 mg Oral Q breakfast  . thiamine  100 mg Oral Daily   Or  . thiamine  100 mg Intravenous Daily  . traZODone  50 mg Oral QHS   Continuous Infusions:   LOS: 3 days    Time spent: 36 minutes spent on chart review, discussion with nursing staff, consultants, updating family and interview/physical exam; more than 50% of that time was spent in counseling and/or coordination of care.    Dvante Hands J [place] DO [facility] Available via Epic secure chat 7am-7pm After these hours, please refer to coverage provider listed on [email or web address] 09/16/2019, 2:38 PM

## 2019-09-17 LAB — CBC
HCT: 34.3 % — ABNORMAL LOW (ref 39.0–52.0)
Hemoglobin: 10.6 g/dL — ABNORMAL LOW (ref 13.0–17.0)
MCH: 27.2 pg (ref 26.0–34.0)
MCHC: 30.9 g/dL (ref 30.0–36.0)
MCV: 87.9 fL (ref 80.0–100.0)
Platelets: 329 10*3/uL (ref 150–400)
RBC: 3.9 MIL/uL — ABNORMAL LOW (ref 4.22–5.81)
RDW: 20.9 % — ABNORMAL HIGH (ref 11.5–15.5)
WBC: 9.4 10*3/uL (ref 4.0–10.5)
nRBC: 0 % (ref 0.0–0.2)

## 2019-09-17 LAB — COMPREHENSIVE METABOLIC PANEL
ALT: 37 U/L (ref 0–44)
AST: 26 U/L (ref 15–41)
Albumin: 2.6 g/dL — ABNORMAL LOW (ref 3.5–5.0)
Alkaline Phosphatase: 69 U/L (ref 38–126)
Anion gap: 9 (ref 5–15)
BUN: 13 mg/dL (ref 8–23)
CO2: 25 mmol/L (ref 22–32)
Calcium: 8.2 mg/dL — ABNORMAL LOW (ref 8.9–10.3)
Chloride: 104 mmol/L (ref 98–111)
Creatinine, Ser: 0.61 mg/dL (ref 0.61–1.24)
GFR calc Af Amer: >60 mL/min (ref >60)
GFR calc non Af Amer: >60 mL/min (ref >60)
Glucose, Bld: 81 mg/dL (ref 70–99)
Potassium: 3.7 mmol/L (ref 3.5–5.1)
Sodium: 138 mmol/L (ref 135–145)
Total Bilirubin: 0.5 mg/dL (ref 0.3–1.2)
Total Protein: 5.7 g/dL — ABNORMAL LOW (ref 6.5–8.1)

## 2019-09-17 LAB — MAGNESIUM: Magnesium: 1.7 mg/dL (ref 1.7–2.4)

## 2019-09-17 MED ORDER — ALUM & MAG HYDROXIDE-SIMETH 200-200-20 MG/5ML PO SUSP
15.0000 mL | ORAL | Status: DC | PRN
  Administered 2019-09-17: 15 mL via ORAL
  Filled 2019-09-17: qty 30

## 2019-09-17 MED ORDER — MAGNESIUM SULFATE 2 GM/50ML IV SOLN
2.0000 g | Freq: Once | INTRAVENOUS | Status: AC
  Administered 2019-09-17: 2 g via INTRAVENOUS
  Filled 2019-09-17: qty 50

## 2019-09-17 NOTE — TOC Progression Note (Signed)
Transition of Care [facility]) - Progression Note    Patient Details  Name: Samuel Reyes MRN: [id number] Date of Birth: 11-15-1955  Transition of Care [facility]) CM/SW [place], [place] Phone Number: 09/17/2019, 5:02 PM  Clinical Narrative:  Patient confirms he is interested in exploring possibility of SNF from hospital as recommended by PT.  He is willing to commit to minimum of 30 days, states that he will have money when he is discharged that will allow him to go to a hotel so he will not be homeless, and understands that this will be a challenge due to SA and [place] issues. Bed search initiated. TOC will continue to follow during the course of hospitalization.      Expected Discharge Plan: Skilled Nursing Facility Barriers to Discharge: SNF Pending bed offer  Expected Discharge Plan and Services Expected Discharge Plan: [place] In-house Referral: Clinical Social Work     Living arrangements for the past 2 months: Homeless                                       Social Determinants of Health ([place]) Interventions    Readmission Risk Interventions No flowsheet data found.

## 2019-09-17 NOTE — Progress Notes (Signed)
PROGRESS NOTE    Samuel Reyes  [id number] DOB: [birth year] DOA: 09/13/2019 PCP: [name], NP    Brief Narrative:   Velton Bou is a 64 y.o.malewith medical history significant ofCOPD, HTN, GERD, CVA, depression, CAD, alcoholism, h/o DT's, Barrett's esophagus found down near [place] [facility] in a yard. Was wet and cold and noted to have core temp of 87 degrees.   In ED treated with [facility], warmed fluids. Noted to have alcohol level of 327 and lactic acid of 2.8 without concomitant leukocytosis. Recent admission for the same 08/01/2019, with transfer to behavioral health hospital for 4 days of inpatient prior to discharge. He has known stressors including loss of child/wife, homelessness and ongoing alcohol use.   Assessment & Plan:   Principal Problem:   Hypothermia Active Problems:   COPD (chronic obstructive pulmonary disease) (HCC)   GERD (gastroesophageal reflux disease)   Alcohol intoxication ([place])   Benign essential HTN   Alcohol use disorder, severe, dependence (HCC)   Major depressive disorder, recurrent episode, moderate with anxious distress ([place])   Tobacco abuse   Homelessness   Hypothermia due to exposure   Hypothermia: Resolved Etiology likely secondary to cold exposure as he was found outside for likely prolonged period of time frame given his homelessness.  Was actively rewarmed and fluid resuscitated on presentation.  Urinalysis not suggestive of infection and without leukocytosis.  Urine culture with no growth. --Blood cultures x2: No growth x3 days --Continue monitor vital signs closely  Acute EtOH intoxication Patient with elevated EtOH level of 327 on admission.  History of significant DTs. --continue CIWAA protocol --Thiamine, folate, multivitamin  Depression/suicidal ideation Patient with long history of depression and chronic suicidal ideations.  Recent admission to behavioral health Hospital early January, states thoughts of  suicidal ideation never really go away.  Has reportedly thoughts of intense with cutting wrists and running into traffic.  Has been seen by psychiatry on 2 occasions during this hospitalization on 09/14/2019 and 09/16/2019; and cleared psychiatrically for discharge. --Gabapentin 300 mg p.o. 3 times daily --Prozac 10 mg p.o. daily --Would benefit from continued outpatient substance abuse program, social work for coordination  Chest pain Hx CAD/MI Patient reports chest pain on presentation, was noted to be significantly hypothermic secondary to cold exposure.  EKG without concerning ST elevation/depression or T wave inversions.  Troponin was minimally elevated which down trended which is inconsistent with ACS.  TTE with normal EF, no regional wall motion abnormalities.  Currently chest pain-free. --Continue to monitor on telemetry  Acute COPD exacerbation Currently oxygenating well on room air. --Breo Ellipta 1 puff daily --albuterol/duonebs prn --Continue prednisone 40 mg p.o. daily, plan 5-day course  GERD: Continue PPI  Essential hypertension Was previously on Norvasc, held secondary to hypotension on presentation with associated hypothermia. --Continue to monitor BP closely, will restart Norvasc when appropriate  Elevated LFTs Etiology likely secondary to continued alcohol abuse.  Acute hepatitis panel negative.  LFTs improving.  Hypomagnesemia Magnesium 1.7, will replete.  Potassium within normal limits. --Repeat electrolytes in a.m. to include magnesium  Leukocytosis Etiology likely secondary to steroid-induced from treatment of COPD exacerbation as above.  Tobacco use disorder: Continue nicotine patch  Weakness, debility, gait disturbance: Evaluated by PT, recommending SNF placement.  Social work for coordination.   DVT prophylaxis: Lovenox Code Status: Full code Family Communication: Discussed with patient extensively at bedside Disposition Plan: Pending SNF placement,  social work for coordination    Consultants:   Psychiatry; 2/15, 2/17 - signed  off; cleared for discharge by psychiatry  Procedures:   TTE 2/15: EF 60-65%, normal LV wall function, RV systolic function normal, IVC dilated.  Antimicrobials:   None   Subjective: Patient seen and examined at bedside, continues to feel weak.  Reports headache. Patient without any other specific complaints at this time.  Specifically denies visual changes, no chest pain, palpitations, no nausea, vomiting, diarrhea, no cough, no weakness, no fatigue.  No acute events overnight per nursing staff.  Objective: Vitals:   09/16/19 2008 09/16/19 2008 09/17/19 0513 09/17/19 0905  BP:  (!) 167/86 (!) 159/81   Pulse:  81 81   Resp:  18 17   Temp:  98.4 F (36.9 C) 98.2 F (36.8 C)   TempSrc:  Oral Oral   SpO2: 97% 99% 95% 96%  Weight:      Height:        Intake/Output Summary (Last 24 hours) at 09/17/2019 1210 Last data filed at 09/17/2019 0430 Gross per 24 hour  Intake 480 ml  Output 1900 ml  Net -1420 ml   Filed Weights   09/13/19 0907  Weight: 68 kg    Examination:  General exam: Appears calm and comfortable, disheveled in appearance Respiratory system: Clear to auscultation. Respiratory effort normal.  No accessory muscle use, oxygenating well on room air Cardiovascular system: S1 & S2 heard, RRR. No JVD, murmurs, rubs, gallops or clicks. No pedal edema. Gastrointestinal system: Abdomen is nondistended, soft and nontender. No organomegaly or masses felt. Normal bowel sounds heard. Central nervous system: Alert and oriented. No focal neurological deficits. Extremities: Symmetric 5 x 5 power. Skin: No rashes, lesions or ulcers Psychiatry: Judgement and insight appear poor.  Depressed mood & flat affect.     Data Reviewed: I have personally reviewed following labs and imaging studies  CBC: Recent Labs  Lab 09/13/19 0354 09/13/19 0828 09/14/19 0327 09/15/19 0237 09/17/19 0441  WBC  5.7 4.3 7.2 11.1* 9.4  NEUTROABS 3.5  --   --  8.5*  --   HGB 13.9 9.9* 10.0* 10.4* 10.6*  HCT 43.6 31.5* 32.7* 32.7* 34.3*  MCV 85.5 86.1 88.6 86.5 87.9  PLT 242 204 202 233 [id number]   Basic Metabolic Panel: Recent Labs  Lab 09/13/19 0354 09/13/19 0354 09/13/19 0828 09/13/19 1302 09/14/19 0327 09/15/19 0237 09/17/19 0441  NA 140  --   --  138 139 138 138  K 3.7  --   --  3.3* 3.7 3.7 3.7  CL 101  --   --  108 112* 106 104  CO2 26  --   --  20* 20* 24 25  GLUCOSE 125*  --   --  94 84 108* 81  BUN 7*  --   --  7* 8 6* 13  CREATININE 0.62   < > 0.54* 0.62 0.68 0.74 0.61  CALCIUM 8.9  --   --  7.0* 7.2* 8.0* 8.2*  MG  --   --   --   --   --  1.5* 1.7  PHOS  --   --   --   --   --  3.0  --    < > = values in this interval not displayed.   GFR: Estimated Creatinine Clearance: 78.1 mL/min (by C-G formula based on SCr of 0.61 mg/dL). Liver Function Tests: Recent Labs  Lab 09/13/19 0354 09/14/19 0849 09/15/19 0237 09/17/19 0441  AST 87* 114* 63* 26  ALT 66* 67* 60* 37  ALKPHOS 126 83  79 69  BILITOT 0.5 0.7 0.4 0.5  PROT 7.7 5.5* 6.0* 5.7*  ALBUMIN 3.6 2.5* 2.8* 2.6*   No results for input(s): LIPASE, AMYLASE in the last 168 hours. No results for input(s): AMMONIA in the last 168 hours. Coagulation Profile: Recent Labs  Lab 09/14/19 0327  INR 0.9   Cardiac Enzymes: No results for input(s): CKTOTAL, CKMB, CKMBINDEX, TROPONINI in the last 168 hours. BNP (last 3 results) No results for input(s): PROBNP in the last 8760 hours. HbA1C: No results for input(s): HGBA1C in the last 72 hours. CBG: No results for input(s): GLUCAP in the last 168 hours. Lipid Profile: No results for input(s): CHOL, HDL, LDLCALC, TRIG, CHOLHDL, LDLDIRECT in the last 72 hours. Thyroid Function Tests: No results for input(s): TSH, T4TOTAL, FREET4, T3FREE, THYROIDAB in the last 72 hours. Anemia Panel: No results for input(s): VITAMINB12, FOLATE, FERRITIN, TIBC, IRON, RETICCTPCT in the last 72  hours. Sepsis Labs: Recent Labs  Lab 09/13/19 0354 09/13/19 1302  LATICACIDVEN 2.8* 1.5    Recent Results (from the past 240 hour(s))  SARS CORONAVIRUS 2 (TAT 6-24 HRS) Nasopharyngeal Nasopharyngeal Swab     Status: None   Collection Time: 09/13/19  4:55 AM   Specimen: Nasopharyngeal Swab  Result Value Ref Range Status   SARS Coronavirus 2 NEGATIVE NEGATIVE Final    Comment: (NOTE) SARS-CoV-2 target nucleic acids are NOT DETECTED. The SARS-CoV-2 RNA is generally detectable in upper and lower respiratory specimens during the acute phase of infection. Negative results do not preclude SARS-CoV-2 infection, do not rule out co-infections with other pathogens, and should not be used as the sole basis for treatment or other patient management decisions. Negative results must be combined with clinical observations, patient history, and epidemiological information. The expected result is Negative. Fact Sheet for Patients: SugarRoll.be Fact Sheet for Healthcare Providers: https://www.woods-mathews.com/ This test is not yet approved or cleared by the [place] FDA and  has been authorized for detection and/or diagnosis of SARS-CoV-2 by FDA under an Emergency Use Authorization (EUA). This EUA will remain  in effect (meaning this test can be used) for the duration of the COVID-19 declaration under Section 56 4(b)(1) of the Act, 21 U.S.C. section 360bbb-3(b)(1), unless the authorization is terminated or revoked sooner. Performed at [facility], [place] [address] [place], [place] [address]   MRSA PCR Screening     Status: None   Collection Time: 09/13/19 12:50 PM   Specimen: Nasal Mucosa; Nasopharyngeal  Result Value Ref Range Status   MRSA by PCR NEGATIVE NEGATIVE Final    Comment:        The GeneXpert MRSA Assay (FDA approved for NASAL specimens only), is one component of a comprehensive MRSA colonization surveillance program. It is  not intended to diagnose MRSA infection nor to guide or monitor treatment for MRSA infections. Performed at [facility], [place] [address], [place], [place] [address]   Culture, Urine     Status: None   Collection Time: 09/13/19  4:12 PM   Specimen: Urine, Random  Result Value Ref Range Status   Specimen Description   Final    URINE, RANDOM Performed at [facility] [address] [place], [place] [address]    Special Requests   Final    Normal Performed at [facility], [place] [address] [place],  [address]    Culture   Final    NO GROWTH Performed at [facility], [place]  [address] [place], [place] [address]    Report Status 09/14/2019 FINAL  Final  Culture, blood (routine x 2)     Status: None (Preliminary result)   Collection Time: 09/13/19  4:53 PM   Specimen: BLOOD  Result Value Ref Range Status   Specimen Description   Final    BLOOD RIGHT ARM Performed at [facility] [address], [place], [place] [address]    Special Requests   Final    BOTTLES DRAWN AEROBIC AND ANAEROBIC Blood Culture adequate volume Performed at [facility] [address] [place], [place] [address]    Culture   Final    NO GROWTH 3 DAYS Performed at [facility], [place] [address] [place], [place] [address]    Report Status PENDING  Incomplete  Culture, blood (routine x 2)     Status: None (Preliminary result)   Collection Time: 09/13/19  4:56 PM   Specimen: BLOOD  Result Value Ref Range Status   Specimen Description   Final    BLOOD RIGHT HAND Performed at [facility] [address] [place], [place] [address]    Special Requests   Final    BOTTLES DRAWN AEROBIC AND ANAEROBIC Blood Culture adequate volume Performed at [facility] [address], [place], [place] [address]    Culture   Final    NO GROWTH 3 DAYS Performed at [facility], [place] [address] [place], [place] [address]    Report Status PENDING  Incomplete         Radiology Studies: DG Abd 1 View  Result Date: 09/16/2019 CLINICAL DATA:  Abdominal pain, distention EXAM: ABDOMEN - 1 VIEW COMPARISON:  None. FINDINGS: Gaseous distension of bowel. This is slightly more prominent within small bowel loops. Findings may reflect ileus although small-bowel obstruction is difficult to exclude. No organomegaly or free air. IMPRESSION: Distended gas-filled bowel loops, predominantly small bowel. Differential considerations would include ileus or small bowel obstruction. Electronically Signed   By: Rolm Baptise M.D.   On: 09/16/2019 11:19        Scheduled Meds: . Chlorhexidine Gluconate Cloth  6 each Topical Q0600  . enoxaparin (LOVENOX) injection  40 mg Subcutaneous Q24H  . FLUoxetine  20 mg Oral Daily  . fluticasone furoate-vilanterol  1 puff Inhalation Daily  . folic acid  1 mg Oral Daily  . gabapentin  300 mg Oral TID  . ipratropium-albuterol  3 mL Nebulization BID  . multivitamin with minerals  1 tablet Oral Daily  . nicotine  21 mg Transdermal Daily  . pantoprazole  40 mg Oral BID  . predniSONE  40 mg Oral Q breakfast  . thiamine  100 mg Oral Daily   Or  . thiamine  100 mg Intravenous Daily  . traZODone  100 mg Oral QHS   Continuous Infusions:   LOS: 4 days    Time spent: 34 minutes spent on chart review, discussion with nursing staff, consultants, updating family and interview/physical exam; more than 50% of that time was spent in counseling and/or coordination of care.    Unnamed Zeien J [place] DO [facility] Available via Epic secure chat 7am-7pm After these hours, please refer to coverage provider listed on [email or web address] 09/17/2019, 12:10 PM

## 2019-09-17 NOTE — NC FL2 (Signed)
[place] LEVEL OF CARE SCREENING TOOL     IDENTIFICATION  Patient Name: Samuel Reyes Birthdate: 1956-03-07 Sex: male Admission Date (Current Location): 09/13/2019  [facility] and [place] Number:  Herbalist and Address:  [facility],  [place], [place]      Provider Number: [id number]  Attending Physician Name and Address:  [place] Donnamarie Poag, DO  Relative Name and Phone Number:       Current Level of Care: Hospital Recommended Level of Care: [place] Prior Approval Number:    Date Approved/Denied:   PASRR Number: pending  Discharge Plan: SNF    Current Diagnoses: Patient Active Problem List   Diagnosis Date Noted  . Hypothermia due to exposure 09/13/2019  . Hypothermia 08/01/2019  . QT prolongation 09/09/2018  . Overdose 09/06/2018  . Alcohol withdrawal ([place]) 08/08/2017  . Homelessness 06/24/2017  . Lung nodule 09/03/2016  . Tobacco abuse 09/03/2016  . Insomnia 09/03/2016  . Malnutrition of moderate degree 08/04/2016  . Atrial tachycardia ([place]) 08/02/2016  . Impaired glucose tolerance 08/02/2016  . Alcohol use with alcohol-induced mood disorder ([place]) 12/12/2015  . Major depressive disorder, recurrent episode, moderate with anxious distress ([place]) 10/30/2015  . Alcohol use disorder, severe, dependence ([place]) 10/29/2015  . Suicidal ideation 09/17/2015  . Alcohol intoxication ([place]) 09/17/2015  . Benign essential HTN 09/17/2015  . COPD (chronic obstructive pulmonary disease) ([place]) 02/17/2013  . GERD (gastroesophageal reflux disease) 02/17/2013    Orientation RESPIRATION BLADDER Height & Weight     Self, Situation, Place  Normal External catheter Weight: 68 kg Height:  5\' 4"  (162.6 cm)  BEHAVIORAL SYMPTOMS/MOOD NEUROLOGICAL BOWEL NUTRITION STATUS  (none) (none) Continent Diet(see d/c summary)  AMBULATORY STATUS COMMUNICATION OF NEEDS Skin   Extensive Assist Verbally Normal                       Personal Care Assistance Level of Assistance  Bathing, Feeding, Dressing Bathing Assistance: Limited assistance Feeding assistance: Independent Dressing Assistance: Limited assistance     Functional Limitations Info  Sight, Hearing, Speech Sight Info: Adequate Hearing Info: Adequate Speech Info: Adequate    SPECIAL CARE FACTORS FREQUENCY  PT (By licensed PT), OT (By licensed OT)     PT Frequency: 5X/W OT Frequency: 5X/W            Contractures Contractures Info: Not present    Additional Factors Info  Code Status, Allergies Code Status Info: full Allergies Info: Bee venom, Penicillins, Vancomycin           Current Medications (09/17/2019):  This is the current hospital active medication list Current Facility-Administered Medications  Medication Dose Route Frequency Provider Last Rate Last Admin  . albuterol (PROVENTIL) (2.5 MG/3ML) 0.083% nebulizer solution 2.5 mg  2.5 mg Nebulization Q6H PRN Donnamae Jude, MD      . alum & mag hydroxide-simeth (MAALOX/MYLANTA) 200-200-20 MG/5ML suspension 15 mL  15 mL Oral Q4H PRN [place] Donnamarie Poag, DO      . Chlorhexidine Gluconate Cloth 2 % PADS 6 each  6 each Topical Q0600 Donnamae Jude, MD   6 each at 09/17/19 0534  . cyclobenzaprine (FLEXERIL) tablet 10 mg  10 mg Oral TID PRN Donnamae Jude, MD   10 mg at 09/14/19 1537  . enoxaparin (LOVENOX) injection 40 mg  40 mg Subcutaneous Q24H Donnamae Jude, MD   40 mg at 09/17/19 0844  . FLUoxetine (PROZAC) capsule 20 mg  20 mg Oral Daily Elodia Florence., MD   20 mg at 09/17/19 0841  . fluticasone furoate-vilanterol (BREO ELLIPTA) 200-25 MCG/INH 1 puff  1 puff Inhalation Daily Donnamae Jude, MD   1 puff at 09/17/19 0912  . folic acid (FOLVITE) tablet 1 mg  1 mg Oral Daily Donnamae Jude, MD   1 mg at 09/17/19 0841  . gabapentin (NEURONTIN) capsule 300 mg  300 mg Oral TID Donnamae Jude, MD   300 mg at 09/17/19 1530  . guaiFENesin-dextromethorphan (ROBITUSSIN DM) 100-10  MG/5ML syrup 5 mL  5 mL Oral Q4H PRN Elodia Florence., MD   5 mL at 09/17/19 1531  . ibuprofen (ADVIL) tablet 600 mg  600 mg Oral Q6H PRN Donnamae Jude, MD   600 mg at 09/17/19 [id number]  . ipratropium-albuterol (DUONEB) 0.5-2.5 (3) MG/3ML nebulizer solution 3 mL  3 mL Nebulization BID Elodia Florence., MD   3 mL at 09/17/19 0905  . multivitamin with minerals tablet 1 tablet  1 tablet Oral Daily Donnamae Jude, MD   1 tablet at 09/17/19 0841  . nicotine (NICODERM CQ - dosed in mg/24 hours) patch 21 mg  21 mg Transdermal Daily Donnamae Jude, MD   21 mg at 09/17/19 0842  . pantoprazole (PROTONIX) EC tablet 40 mg  40 mg Oral BID Donnamae Jude, MD   40 mg at 09/17/19 0841  . predniSONE (DELTASONE) tablet 40 mg  40 mg Oral Q breakfast Elodia Florence., MD   40 mg at 09/17/19 0841  . senna-docusate (Senokot-S) tablet 1 tablet  1 tablet Oral QHS PRN Donnamae Jude, MD      . thiamine tablet 100 mg  100 mg Oral Daily Donnamae Jude, MD   100 mg at 09/17/19 [id number]   Or  . thiamine (B-1) injection 100 mg  100 mg Intravenous Daily Donnamae Jude, MD      . traZODone (DESYREL) tablet 100 mg  100 mg Oral QHS [place] Eric J, DO   100 mg at 09/16/19 2258     Discharge Medications: Please see discharge summary for a list of discharge medications.  Relevant Imaging Results:  Relevant Lab Results:   Additional Information [place], [place]

## 2019-09-18 LAB — BASIC METABOLIC PANEL
Anion gap: 7 (ref 5–15)
BUN: 17 mg/dL (ref 8–23)
CO2: 25 mmol/L (ref 22–32)
Calcium: 8.2 mg/dL — ABNORMAL LOW (ref 8.9–10.3)
Chloride: 107 mmol/L (ref 98–111)
Creatinine, Ser: 0.67 mg/dL (ref 0.61–1.24)
GFR calc Af Amer: >60 mL/min (ref >60)
GFR calc non Af Amer: >60 mL/min (ref >60)
Glucose, Bld: 92 mg/dL (ref 70–99)
Potassium: 3.5 mmol/L (ref 3.5–5.1)
Sodium: 139 mmol/L (ref 135–145)

## 2019-09-18 LAB — MAGNESIUM: Magnesium: 1.8 mg/dL (ref 1.7–2.4)

## 2019-09-18 NOTE — Progress Notes (Signed)
Physical Therapy Treatment Patient Details Name: Samuel Reyes MRN: [id number] DOB: 1955-08-03 Today's Date: 09/18/2019    History of Present Illness 64 y.o. male with medical history significant of COPD, HTN, GERD, CVA, depression, CAD, alcoholism, h/o DT's, Barrett's esophagus found down near [employer] [facility] in a yard. Was wet and cold and noted to have core temp of 87 degrees. In ED treated with [facility], warmed fluids. Noted to have alcohol level of 327 and lactic acid of 2.8 without concomitant leukocytosis. Recent admission for the same  08/01/2019, with transfer to behavioral health hospital for 4 days of inpatient prior to d/c. He has known stressors including loss of child and wife and homelessness and ongoing alcohol use..  General Comments: AxO x 3 said he has been living on the streets past 2 years since his second wife passed.  Also stated his son passed 8 months agao (Asthma).  Stated he hangs around [address] and someone stoll his his bag of belingings which included his cell phone and his cane is gone as well.  Pt also stated he likes to drink [employer].  General Gait Details: stated prior he amb with a cane "cause of my bad knee" (right).  First amb with a RW at [facility].  Slight unsteadiness esp with turns.  Second amb without any AD.  Increased unsteadiness and decreased speed needed with noticable "sway"  and "limp".  Pt stated his R LE is about an inch sorter than his L due to multiple knee surgeries.  Pt required MinAssist amb [facility]. Pt has agreed to SNF 30 days  PT Comments      Follow Up Recommendations  SNF     Equipment Recommendations  Rolling walker with 5" wheels;Cane    Recommendations for Other Services       Precautions / Restrictions Precautions Precautions: Fall Precaution Comments: "bad" R knee Restrictions Weight Bearing Restrictions: No Other Position/Activity Restrictions: WBAT    Mobility  Bed Mobility Overal bed mobility: Modified  Independent             General bed mobility comments: self able  Transfers Overall transfer level: Needs assistance Equipment used: Rolling walker (2 wheeled) Transfers: Sit to/from Stand Sit to Stand: Min assist         General transfer comment: assist to rise with silght unsteadiness.  Feeling "weak".  Ambulation/Gait Ambulation/Gait assistance: Min assist Gait Distance (Feet): 85 Feet Assistive device: Rolling walker (2 wheeled);None Gait Pattern/deviations: Step-through pattern;Decreased stride length;Decreased stance time - right;Shuffle Gait velocity: decreased   General Gait Details: stated prior he amb with a cane "cause of my bad knee" (right).  First amb with a RW at [facility].  Slight unsteadiness esp with turns.  Second amb without any AD.  Increased unsteadiness and decreased speed needed with noticable "sway"  and "limp".  Pt stated his R LE is about an inch sorter than his L due to multiple knee surgeries.  Pt required MinAssist amb [facility].   Stairs             Wheelchair Mobility    Modified Rankin (Stroke Patients Only)       Balance                                            Cognition Arousal/Alertness: Awake/alert Behavior During Therapy: WFL for tasks assessed/performed Overall Cognitive Status:  Within Functional Limits for tasks assessed                                 General Comments: AxO x 3 said he has been living on the streets past 2 years since his second wife passed.  Also stated his son passed 8 months agao (Asthma).  Stated he hangs around [address] and someone stoll his his bag of belingings which included his cell phone and his cane is gone as well.      Exercises      General Comments        Pertinent Vitals/Pain Pain Assessment: Faces Faces Pain Scale: Hurts a little bit Pain Location: general aches Pain Descriptors / Indicators: Aching Pain Intervention(s): Monitored during  session    Home Living                      Prior Function            PT Goals (current goals can now be found in the care plan section) Progress towards PT goals: Progressing toward goals    Frequency    Min 2X/week      PT Plan Current plan remains appropriate    Co-evaluation              AM-PAC PT "6 Clicks" Mobility   Outcome Measure  Help needed turning from your back to your side while in a flat bed without using bedrails?: A Little Help needed moving from lying on your back to sitting on the side of a flat bed without using bedrails?: A Little Help needed moving to and from a bed to a chair (including a wheelchair)?: A Little Help needed standing up from a chair using your arms (e.g., wheelchair or bedside chair)?: A Little Help needed to walk in hospital room?: A Little Help needed climbing 3-5 steps with a railing? : A Lot 6 Click Score: 17    End of Session Equipment Utilized During Treatment: Gait belt Activity Tolerance: Patient tolerated treatment well Patient left: in bed;with call bell/phone within reach;with bed alarm set;with nursing/sitter in room Nurse Communication: Mobility status PT Visit Diagnosis: Other abnormalities of gait and mobility (R26.89);Unsteadiness on feet (R26.81)     Time: [id number] PT Time Calculation (min) (ACUTE ONLY): 18 min  Charges:  $Gait Training: 8-22 mins                     Rica Koyanagi  PTA Acute  Rehabilitation Services Pager      [phone] Office      [phone]

## 2019-09-18 NOTE — Progress Notes (Signed)
PROGRESS NOTE    Samuel Reyes  [id number] DOB: 03/13/1956 DOA: 09/13/2019 PCP: [name], NP    Brief Narrative:   Samuel Reyes is a 64 y.o.malewith medical history significant ofCOPD, HTN, GERD, CVA, depression, CAD, alcoholism, h/o DT's, Barrett's esophagus found down near [facility] in a yard. Was wet and cold and noted to have core temp of 87 degrees.   In ED treated with [facility], warmed fluids. Noted to have alcohol level of 327 and lactic acid of 2.8 without concomitant leukocytosis. Recent admission for the same 08/01/2019, with transfer to behavioral health hospital for 4 days of inpatient prior to discharge. He has known stressors including loss of child/wife, homelessness and ongoing alcohol use.   Assessment & Plan:   Principal Problem:   Hypothermia Active Problems:   COPD (chronic obstructive pulmonary disease) (HCC)   GERD (gastroesophageal reflux disease)   Alcohol intoxication ([place])   Benign essential HTN   Alcohol use disorder, severe, dependence (HCC)   Major depressive disorder, recurrent episode, moderate with anxious distress ([place])   Tobacco abuse   Homelessness   Hypothermia due to exposure   Hypothermia: Resolved Etiology likely secondary to cold exposure as he was found outside for likely prolonged period of time frame given his homelessness.  Was actively rewarmed and fluid resuscitated on presentation.  Urinalysis not suggestive of infection and without leukocytosis.  Urine culture with no growth. --Blood cultures x2: No growth x 4 days --Continue monitor vital signs closely  Acute EtOH intoxication Patient with elevated EtOH level of 327 on admission.  History of significant DTs. --continue CIWAA protocol --Thiamine, folate, multivitamin  Depression/suicidal ideation Patient with long history of depression and chronic suicidal ideations.  Recent admission to behavioral health Hospital early January, states thoughts of  suicidal ideation never really go away.  Has reportedly thoughts of intense with cutting wrists and running into traffic.  Has been seen by psychiatry on 2 occasions during this hospitalization on 09/14/2019 and 09/16/2019; and cleared psychiatrically for discharge. --Gabapentin 300 mg p.o. 3 times daily --Prozac 10 mg p.o. daily --Discontinued suicide precautions has been cleared by psychiatry on 2 occasions during this hospitalization --Would benefit from continued outpatient substance abuse program, social work for coordination  Chest pain Hx CAD/MI Patient reports chest pain on presentation, was noted to be significantly hypothermic secondary to cold exposure.  EKG without concerning ST elevation/depression or T wave inversions.  Troponin was minimally elevated which down trended which is inconsistent with ACS.  TTE with normal EF, no regional wall motion abnormalities.  Currently chest pain-free. --Continue to monitor on telemetry  Acute COPD exacerbation Currently oxygenating well on room air.  Completed 5-day course of prednisone 40 mg. --Breo Ellipta 1 puff daily --albuterol/duonebs prn  GERD: Continue PPI  Essential hypertension Was previously on Norvasc, held secondary to hypotension on presentation with associated hypothermia. --Continue to monitor BP closely, will restart Norvasc when appropriate  Elevated LFTs Etiology likely secondary to continued alcohol abuse.  Acute hepatitis panel negative.  LFTs improving.  Hypomagnesemia Magnesium 1.8.  Potassium within normal limits. --Repeat electrolytes in a.m. to include magnesium  Leukocytosis Etiology likely secondary to steroid-induced from treatment of COPD exacerbation as above.  Tobacco use disorder: Continue nicotine patch  Weakness, debility, gait disturbance: Evaluated by PT, recommending SNF placement.  Social work for coordination.   DVT prophylaxis: Lovenox Code Status: Full code Family Communication:  Discussed with patient extensively at bedside Disposition Plan: Pending SNF placement, social work for  coordination    Consultants:   Psychiatry; 2/15, 2/17 - signed off; cleared for discharge by psychiatry  Procedures:   TTE 2/15: EF 60-65%, normal LV wall function, RV systolic function normal, IVC dilated.  Antimicrobials:   None   Subjective: Patient seen and examined at bedside, continues to feel weak.  Tolerating diet.  Continues to work with physical therapy.  Reports cough.  Patient without any other specific complaints at this time.  Specifically denies visual changes, no chest pain, palpitations, no nausea, vomiting, diarrhea, no cough, no weakness, no fatigue.  No acute events overnight per nursing staff.  Objective: Vitals:   09/17/19 2024 09/18/19 0024 09/18/19 0518 09/18/19 0845  BP: (!) 143/74 (!) 148/78 (!) 159/80   Pulse: 88 86 79   Resp: 16 16 17    Temp: 98.5 F (36.9 C) 98.2 F (36.8 C) 98.5 F (36.9 C)   TempSrc: Oral Oral Oral   SpO2: 96% 95% 97% 96%  Weight:      Height:        Intake/Output Summary (Last 24 hours) at 09/18/2019 1338 Last data filed at 09/18/2019 0530 Gross per 24 hour  Intake 480 ml  Output 900 ml  Net -420 ml   Filed Weights   09/13/19 0907  Weight: 68 kg    Examination:  General exam: Appears calm and comfortable, disheveled in appearance Respiratory system: Clear to auscultation. Respiratory effort normal.  No accessory muscle use, oxygenating well on room air Cardiovascular system: S1 & S2 heard, RRR. No JVD, murmurs, rubs, gallops or clicks. No pedal edema. Gastrointestinal system: Abdomen is nondistended, soft and nontender. No organomegaly or masses felt. Normal bowel sounds heard. Central nervous system: Alert and oriented. No focal neurological deficits. Extremities: Symmetric 5 x 5 power. Skin: No rashes, lesions or ulcers Psychiatry: Judgement and insight appear poor.  Depressed mood & flat affect.     Data  Reviewed: I have personally reviewed following labs and imaging studies  CBC: Recent Labs  Lab 09/13/19 0354 09/13/19 0828 09/14/19 0327 09/15/19 0237 09/17/19 0441  WBC 5.7 4.3 7.2 11.1* 9.4  NEUTROABS 3.5  --   --  8.5*  --   HGB 13.9 9.9* 10.0* 10.4* 10.6*  HCT 43.6 31.5* 32.7* 32.7* 34.3*  MCV 85.5 86.1 88.6 86.5 87.9  PLT 242 204 202 233 [id number]   Basic Metabolic Panel: Recent Labs  Lab 09/13/19 1302 09/14/19 0327 09/15/19 0237 09/17/19 0441 09/18/19 0442  NA 138 139 138 138 139  K 3.3* 3.7 3.7 3.7 3.5  CL 108 112* 106 104 107  CO2 20* 20* 24 25 25   GLUCOSE 94 84 108* 81 92  BUN 7* 8 6* 13 17  CREATININE 0.62 0.68 0.74 0.61 0.67  CALCIUM 7.0* 7.2* 8.0* 8.2* 8.2*  MG  --   --  1.5* 1.7 1.8  PHOS  --   --  3.0  --   --    GFR: Estimated Creatinine Clearance: 78.1 mL/min (by C-G formula based on SCr of 0.67 mg/dL). Liver Function Tests: Recent Labs  Lab 09/13/19 0354 09/14/19 0849 09/15/19 0237 09/17/19 0441  AST 87* 114* 63* 26  ALT 66* 67* 60* 37  ALKPHOS 126 83 79 69  BILITOT 0.5 0.7 0.4 0.5  PROT 7.7 5.5* 6.0* 5.7*  ALBUMIN 3.6 2.5* 2.8* 2.6*   No results for input(s): LIPASE, AMYLASE in the last 168 hours. No results for input(s): AMMONIA in the last 168 hours. Coagulation Profile: Recent Labs  Lab  09/14/19 0327  INR 0.9   Cardiac Enzymes: No results for input(s): CKTOTAL, CKMB, CKMBINDEX, TROPONINI in the last 168 hours. BNP (last 3 results) No results for input(s): PROBNP in the last 8760 hours. HbA1C: No results for input(s): HGBA1C in the last 72 hours. CBG: No results for input(s): GLUCAP in the last 168 hours. Lipid Profile: No results for input(s): CHOL, HDL, LDLCALC, TRIG, CHOLHDL, LDLDIRECT in the last 72 hours. Thyroid Function Tests: No results for input(s): TSH, T4TOTAL, FREET4, T3FREE, THYROIDAB in the last 72 hours. Anemia Panel: No results for input(s): VITAMINB12, FOLATE, FERRITIN, TIBC, IRON, RETICCTPCT in the last 72  hours. Sepsis Labs: Recent Labs  Lab 09/13/19 0354 09/13/19 1302  LATICACIDVEN 2.8* 1.5    Recent Results (from the past 240 hour(s))  SARS CORONAVIRUS 2 (TAT 6-24 HRS) Nasopharyngeal Nasopharyngeal Swab     Status: None   Collection Time: 09/13/19  4:55 AM   Specimen: Nasopharyngeal Swab  Result Value Ref Range Status   SARS Coronavirus 2 NEGATIVE NEGATIVE Final    Comment: (NOTE) SARS-CoV-2 target nucleic acids are NOT DETECTED. The SARS-CoV-2 RNA is generally detectable in upper and lower respiratory specimens during the acute phase of infection. Negative results do not preclude SARS-CoV-2 infection, do not rule out co-infections with other pathogens, and should not be used as the sole basis for treatment or other patient management decisions. Negative results must be combined with clinical observations, patient history, and epidemiological information. The expected result is Negative. Fact Sheet for Patients: SugarRoll.be Fact Sheet for Healthcare Providers: https://www.woods-mathews.com/ This test is not yet approved or cleared by the [place] FDA and  has been authorized for detection and/or diagnosis of SARS-CoV-2 by FDA under an Emergency Use Authorization (EUA). This EUA will remain  in effect (meaning this test can be used) for the duration of the COVID-19 declaration under Section 56 4(b)(1) of the Act, 21 U.S.C. section 360bbb-3(b)(1), unless the authorization is terminated or revoked sooner. Performed at [facility], [place] [address] [place], [place] [address]   MRSA PCR Screening     Status: None   Collection Time: 09/13/19 12:50 PM   Specimen: Nasal Mucosa; Nasopharyngeal  Result Value Ref Range Status   MRSA by PCR NEGATIVE NEGATIVE Final    Comment:        The GeneXpert MRSA Assay (FDA approved for NASAL specimens only), is one component of a comprehensive MRSA colonization surveillance program. It is  not intended to diagnose MRSA infection nor to guide or monitor treatment for MRSA infections. Performed at [facility], [place] [address] [place], [place] [address]   Culture, Urine     Status: None   Collection Time: 09/13/19  4:12 PM   Specimen: Urine, Random  Result Value Ref Range Status   Specimen Description   Final    URINE, RANDOM Performed at [place] [address] [place], [place] [address]    Special Requests   Final    Normal Performed at [facility], [place] [address] [place], [place] [address]    Culture   Final    NO GROWTH Performed at [facility], [place] [address] [place], [place] [address]    Report Status 09/14/2019 FINAL  Final  Culture, blood (routine x 2)     Status: None (Preliminary result)   Collection Time: 09/13/19  4:53 PM   Specimen: BLOOD  Result Value Ref Range Status   Specimen Description  Final    BLOOD RIGHT ARM Performed at [place] [address], [place], [place] [address]    Special Requests   Final    BOTTLES DRAWN AEROBIC AND ANAEROBIC Blood Culture adequate volume Performed at [place] [address] [place],  [place] [address]    Culture   Final    NO GROWTH 4 DAYS Performed at [facility], [place] [address], [place], [place] [address]    Report Status PENDING  Incomplete  Culture, blood (routine x 2)     Status: None (Preliminary result)   Collection Time: 09/13/19  4:56 PM   Specimen: BLOOD  Result Value Ref Range Status   Specimen Description   Final    BLOOD RIGHT HAND Performed at [place] [address] [place], [place] [address]    Special Requests   Final    BOTTLES DRAWN AEROBIC AND ANAEROBIC Blood Culture adequate volume Performed at [place] [address] [place], [place] [address]    Culture   Final    NO GROWTH 4 DAYS Performed at [facility], [place] [address] [place],  [address]    Report Status PENDING  Incomplete         Radiology Studies: No results found.      Scheduled Meds: . Chlorhexidine Gluconate Cloth  6 each Topical Q0600  . enoxaparin (LOVENOX) injection  40 mg Subcutaneous Q24H  . FLUoxetine  20 mg Oral Daily  . fluticasone furoate-vilanterol  1 puff Inhalation Daily  . folic acid  1 mg Oral Daily  . gabapentin  300 mg Oral TID  . ipratropium-albuterol  3 mL Nebulization BID  . multivitamin with minerals  1 tablet Oral Daily  . nicotine  21 mg Transdermal Daily  . pantoprazole  40 mg Oral BID  . thiamine  100 mg Oral Daily   Or  . thiamine  100 mg Intravenous Daily  . traZODone  100 mg Oral QHS   Continuous Infusions:   LOS: 5 days    Time spent: 34 minutes spent on chart review, discussion with nursing staff, consultants, updating family and interview/physical exam; more than 50% of that time was spent in counseling and/or coordination of care.    Zvi Duplantis J [place] DO [facility] Available via Epic secure chat 7am-7pm After these hours, please refer to coverage provider listed on [email or web address] 09/18/2019, 1:38 PM

## 2019-09-19 LAB — CULTURE, BLOOD (ROUTINE X 2)
Culture: NO GROWTH
Culture: NO GROWTH
Special Requests: ADEQUATE
Special Requests: ADEQUATE

## 2019-09-19 NOTE — Progress Notes (Signed)
PROGRESS NOTE    Samuel Reyes  [id number] DOB: [birth year] DOA: 09/13/2019 PCP: [name], NP    Brief Narrative:   Samuel Reyes is a 64 y.o.malewith medical history significant ofCOPD, HTN, GERD, CVA, depression, CAD, alcoholism, h/o DT's, Barrett's esophagus found down near [place] [facility] in a yard. Was wet and cold and noted to have core temp of 87 degrees.   In ED treated with [facility], warmed fluids. Noted to have alcohol level of 327 and lactic acid of 2.8 without concomitant leukocytosis. Recent admission for the same 08/01/2019, with transfer to behavioral health hospital for 4 days of inpatient prior to discharge. He has known stressors including loss of child/wife, homelessness and ongoing alcohol use.   Assessment & Plan:   Principal Problem:   Hypothermia Active Problems:   COPD (chronic obstructive pulmonary disease) (HCC)   GERD (gastroesophageal reflux disease)   Alcohol intoxication ([place])   Benign essential HTN   Alcohol use disorder, severe, dependence (HCC)   Major depressive disorder, recurrent episode, moderate with anxious distress ([place])   Tobacco abuse   Homelessness   Hypothermia due to exposure   Hypothermia: Resolved Etiology likely secondary to cold exposure as he was found outside for likely prolonged period of time frame given his homelessness.  Was actively rewarmed and fluid resuscitated on presentation.  Urinalysis not suggestive of infection and without leukocytosis.  Urine culture with no growth.  Blood culture showed no growth x5 days.  Acute EtOH intoxication Patient with elevated EtOH level of 327 on admission.  History of significant DTs.  Was monitored during hospitalization under [facility] protocol; no signs of withdrawal symptoms.  Now will discontinue [facility] protocol. --Thiamine, folate, multivitamin  Depression/suicidal ideation Patient with long history of depression and chronic suicidal ideations.  Recent admission to  behavioral health Hospital early January, states thoughts of suicidal ideation never really go away.  Has reportedly thoughts of intense with cutting wrists and running into traffic.  Has been seen by psychiatry on 2 occasions during this hospitalization on 09/14/2019 and 09/16/2019; and cleared psychiatrically for discharge. --Gabapentin 300 mg p.o. 3 times daily --Prozac 10 mg p.o. daily --Discontinued suicide precautions has been cleared by psychiatry on 2 occasions during this hospitalization --Would benefit from continued outpatient substance abuse program, social work for coordination  Chest pain Hx CAD/MI Patient reports chest pain on presentation, was noted to be significantly hypothermic secondary to cold exposure.  EKG without concerning ST elevation/depression or T wave inversions.  Troponin was minimally elevated which down trended which is inconsistent with ACS.  TTE with normal EF, no regional wall motion abnormalities.  Currently chest pain-free. --Continue to monitor on telemetry  Acute COPD exacerbation Currently oxygenating well on room air.  Completed 5-day course of prednisone 40 mg. --Breo Ellipta 1 puff daily --albuterol/duonebs prn  GERD: Continue PPI  Essential hypertension Was previously on Norvasc, held secondary to hypotension on presentation with associated hypothermia. --Continue to monitor BP closely, will restart Norvasc when appropriate  Elevated LFTs Etiology likely secondary to continued alcohol abuse.  Acute hepatitis panel negative.  LFTs improving.  Hypomagnesemia Repleted during hospitalization.  Leukocytosis Etiology likely secondary to steroid-induced from treatment of COPD exacerbation as above.  Tobacco use disorder: Continue nicotine patch  Weakness, debility, gait disturbance: Evaluated by PT, recommending SNF placement.  Social work for coordination.   DVT prophylaxis: Lovenox Code Status: Full code Family Communication: Discussed with  patient extensively at bedside Disposition Plan: Pending SNF placement, social work for  coordination    Consultants:   Psychiatry; 2/15, 2/17 - signed off; cleared for discharge by psychiatry  Procedures:   TTE 2/15: EF 60-65%, normal LV wall function, RV systolic function normal, IVC dilated.  Antimicrobials:   None   Subjective: Patient seen and examined at bedside, no complaints.  Tolerating diet.  Specifically denies headache, no visual changes, no chest pain, palpitations, no nausea, vomiting, diarrhea, no cough, no weakness, no fatigue.  No acute events overnight per nursing staff.  Objective: Vitals:   09/18/19 1408 09/18/19 2007 09/19/19 0525 09/19/19 0732  BP: 118/66 (!) 149/83 (!) 145/79   Pulse: 90 90 84 81  Resp: 16 20 20 20   Temp: 98.6 F (37 C) 97.8 F (36.6 C) 97.8 F (36.6 C)   TempSrc: Oral Oral Oral   SpO2: 95% 97% 97% 97%  Weight:      Height:        Intake/Output Summary (Last 24 hours) at 09/19/2019 1118 Last data filed at 09/19/2019 0600 Gross per 24 hour  Intake 710 ml  Output 1275 ml  Net -565 ml   Filed Weights   09/13/19 0907  Weight: 68 kg    Examination:  General exam: Appears calm and comfortable, disheveled in appearance Respiratory system: Clear to auscultation. Respiratory effort normal.  No accessory muscle use, oxygenating well on room air Cardiovascular system: S1 & S2 heard, RRR. No JVD, murmurs, rubs, gallops or clicks. No pedal edema. Gastrointestinal system: Abdomen is nondistended, soft and nontender. No organomegaly or masses felt. Normal bowel sounds heard. Central nervous system: Alert and oriented. No focal neurological deficits. Extremities: Symmetric 5 x 5 power. Skin: No rashes, lesions or ulcers Psychiatry: Judgement and insight appear poor.  Depressed mood & flat affect.     Data Reviewed: I have personally reviewed following labs and imaging studies  CBC: Recent Labs  Lab 09/13/19 0354 09/13/19 0828  09/14/19 0327 09/15/19 0237 09/17/19 0441  WBC 5.7 4.3 7.2 11.1* 9.4  NEUTROABS 3.5  --   --  8.5*  --   HGB 13.9 9.9* 10.0* 10.4* 10.6*  HCT 43.6 31.5* 32.7* 32.7* 34.3*  MCV 85.5 86.1 88.6 86.5 87.9  PLT 242 204 202 233 [id number]   Basic Metabolic Panel: Recent Labs  Lab 09/13/19 1302 09/14/19 0327 09/15/19 0237 09/17/19 0441 09/18/19 0442  NA 138 139 138 138 139  K 3.3* 3.7 3.7 3.7 3.5  CL 108 112* 106 104 107  CO2 20* 20* 24 25 25   GLUCOSE 94 84 108* 81 92  BUN 7* 8 6* 13 17  CREATININE 0.62 0.68 0.74 0.61 0.67  CALCIUM 7.0* 7.2* 8.0* 8.2* 8.2*  MG  --   --  1.5* 1.7 1.8  PHOS  --   --  3.0  --   --    GFR: Estimated Creatinine Clearance: 78.1 mL/min (by C-G formula based on SCr of 0.67 mg/dL). Liver Function Tests: Recent Labs  Lab 09/13/19 0354 09/14/19 0849 09/15/19 0237 09/17/19 0441  AST 87* 114* 63* 26  ALT 66* 67* 60* 37  ALKPHOS 126 83 79 69  BILITOT 0.5 0.7 0.4 0.5  PROT 7.7 5.5* 6.0* 5.7*  ALBUMIN 3.6 2.5* 2.8* 2.6*   No results for input(s): LIPASE, AMYLASE in the last 168 hours. No results for input(s): AMMONIA in the last 168 hours. Coagulation Profile: Recent Labs  Lab 09/14/19 0327  INR 0.9   Cardiac Enzymes: No results for input(s): CKTOTAL, CKMB, CKMBINDEX, TROPONINI in the last 168  hours. BNP (last 3 results) No results for input(s): PROBNP in the last 8760 hours. HbA1C: No results for input(s): HGBA1C in the last 72 hours. CBG: No results for input(s): GLUCAP in the last 168 hours. Lipid Profile: No results for input(s): CHOL, HDL, LDLCALC, TRIG, CHOLHDL, LDLDIRECT in the last 72 hours. Thyroid Function Tests: No results for input(s): TSH, T4TOTAL, FREET4, T3FREE, THYROIDAB in the last 72 hours. Anemia Panel: No results for input(s): VITAMINB12, FOLATE, FERRITIN, TIBC, IRON, RETICCTPCT in the last 72 hours. Sepsis Labs: Recent Labs  Lab 09/13/19 0354 09/13/19 1302  LATICACIDVEN 2.8* 1.5    Recent Results (from the past 240  hour(s))  SARS CORONAVIRUS 2 (TAT 6-24 HRS) Nasopharyngeal Nasopharyngeal Swab     Status: None   Collection Time: 09/13/19  4:55 AM   Specimen: Nasopharyngeal Swab  Result Value Ref Range Status   SARS Coronavirus 2 NEGATIVE NEGATIVE Final    Comment: (NOTE) SARS-CoV-2 target nucleic acids are NOT DETECTED. The SARS-CoV-2 RNA is generally detectable in upper and lower respiratory specimens during the acute phase of infection. Negative results do not preclude SARS-CoV-2 infection, do not rule out co-infections with other pathogens, and should not be used as the sole basis for treatment or other patient management decisions. Negative results must be combined with clinical observations, patient history, and epidemiological information. The expected result is Negative. Fact Sheet for Patients: SugarRoll.be Fact Sheet for Healthcare Providers: https://www.woods-mathews.com/ This test is not yet approved or cleared by the [place] FDA and  has been authorized for detection and/or diagnosis of SARS-CoV-2 by FDA under an Emergency Use Authorization (EUA). This EUA will remain  in effect (meaning this test can be used) for the duration of the COVID-19 declaration under Section 56 4(b)(1) of the Act, 21 U.S.C. section 360bbb-3(b)(1), unless the authorization is terminated or revoked sooner. Performed at [facility], [place] [address], [place], [place] [address]   MRSA PCR Screening     Status: None   Collection Time: 09/13/19 12:50 PM   Specimen: Nasal Mucosa; Nasopharyngeal  Result Value Ref Range Status   MRSA by PCR NEGATIVE NEGATIVE Final    Comment:        The GeneXpert MRSA Assay (FDA approved for NASAL specimens only), is one component of a comprehensive MRSA colonization surveillance program. It is not intended to diagnose MRSA infection nor to guide or monitor treatment for MRSA infections. Performed at [facility], [place] [address] [place], [place] [address]   Culture, Urine     Status: None   Collection Time: 09/13/19  4:12 PM   Specimen: Urine, Random  Result Value Ref Range Status   Specimen Description   Final    URINE, RANDOM Performed at [facility] [address] [place], [place] [address]    Special Requests   Final    Normal Performed at [facility], [place] [address] [place], [place] [address]    Culture   Final    NO GROWTH Performed at [facility], [place] [address] [place], [place] [address]    Report Status 09/14/2019 FINAL  Final  Culture, blood (routine x 2)     Status: None   Collection Time: 09/13/19  4:53 PM   Specimen: BLOOD  Result Value Ref Range Status   Specimen Description BLOOD RIGHT ARM  Final   Special Requests   Final    BOTTLES DRAWN AEROBIC AND ANAEROBIC Blood Culture adequate  volume Performed at [facility], [place] [address], [place], [place] [address]    Culture NO GROWTH 5 DAYS  Final   Report Status 09/19/2019 FINAL  Final  Culture, blood (routine x 2)     Status: None   Collection Time: 09/13/19  4:56 PM   Specimen: BLOOD  Result Value Ref Range Status   Specimen Description BLOOD RIGHT HAND  Final   Special Requests   Final    BOTTLES DRAWN AEROBIC AND ANAEROBIC Blood Culture adequate volume Performed at [facility] [address], [place], [place] [address]    Culture NO GROWTH 5 DAYS  Final   Report Status 09/19/2019 FINAL  Final         Radiology Studies: No results found.      Scheduled Meds: . Chlorhexidine Gluconate Cloth  6 each Topical Q0600  . enoxaparin (LOVENOX) injection  40 mg Subcutaneous Q24H  . FLUoxetine  20 mg Oral Daily  . fluticasone furoate-vilanterol  1 puff Inhalation Daily  . folic acid  1 mg Oral Daily  . gabapentin  300 mg Oral TID  . multivitamin with minerals  1 tablet Oral Daily  . nicotine  21 mg  Transdermal Daily  . pantoprazole  40 mg Oral BID  . thiamine  100 mg Oral Daily   Or  . thiamine  100 mg Intravenous Daily  . traZODone  100 mg Oral QHS   Continuous Infusions:   LOS: 6 days    Time spent: 34 minutes spent on chart review, discussion with nursing staff, consultants, updating family and interview/physical exam; more than 50% of that time was spent in counseling and/or coordination of care.    Luverne Zerkle J [place] DO [facility] Available via Epic secure chat 7am-7pm After these hours, please refer to coverage provider listed on [email or web address] 09/19/2019, 11:18 AM

## 2019-09-20 LAB — CREATININE, SERUM
Creatinine, Ser: 0.79 mg/dL (ref 0.61–1.24)
GFR calc Af Amer: >60 mL/min (ref >60)
GFR calc non Af Amer: >60 mL/min (ref >60)

## 2019-09-20 MED ORDER — HYDROCODONE-ACETAMINOPHEN 5-325 MG PO TABS
1.0000 | ORAL_TABLET | Freq: Three times a day (TID) | ORAL | Status: DC | PRN
  Administered 2019-09-20 – 2019-09-21 (×3): 1 via ORAL
  Filled 2019-09-20 (×3): qty 1

## 2019-09-20 NOTE — Progress Notes (Signed)
PROGRESS NOTE    Samuel Reyes  [id number] DOB: 05-05-1956 DOA: 09/13/2019 PCP: [name], NP    Brief Narrative:   Samuel Reyes is a 64 y.o.malewith medical history significant ofCOPD, HTN, GERD, CVA, depression, CAD, alcoholism, h/o DT's, Barrett's esophagus found down near [place] [facility] in a yard. Was wet and cold and noted to have core temp of 87 degrees.   In ED treated with [facility], warmed fluids. Noted to have alcohol level of 327 and lactic acid of 2.8 without concomitant leukocytosis. Recent admission for the same 08/01/2019, with transfer to behavioral health hospital for 4 days of inpatient prior to discharge. He has known stressors including loss of child/wife, homelessness and ongoing alcohol use.   Assessment & Plan:   Principal Problem:   Hypothermia Active Problems:   COPD (chronic obstructive pulmonary disease) (HCC)   GERD (gastroesophageal reflux disease)   Alcohol intoxication ([place])   Benign essential HTN   Alcohol use disorder, severe, dependence (HCC)   Major depressive disorder, recurrent episode, moderate with anxious distress ([place])   Tobacco abuse   Homelessness   Hypothermia due to exposure   Hypothermia: Resolved Etiology likely secondary to cold exposure as he was found outside for likely prolonged period of time frame given his homelessness.  Was actively rewarmed and fluid resuscitated on presentation.  Urinalysis not suggestive of infection and without leukocytosis.  Urine culture with no growth.  Blood culture showed no growth x5 days.  Acute EtOH intoxication Patient with elevated EtOH level of 327 on admission.  History of significant DTs.  Was monitored during hospitalization under [facility] protocol; no signs of withdrawal symptoms.  Now will discontinue [facility] protocol. --Thiamine, folate, multivitamin  Depression/suicidal ideation Patient with long history of depression and chronic suicidal ideations.  Recent admission to  behavioral health Hospital early January, states thoughts of suicidal ideation never really go away.  Has reportedly thoughts of intense with cutting wrists and running into traffic.  Has been seen by psychiatry on 2 occasions during this hospitalization on 09/14/2019 and 09/16/2019; and cleared psychiatrically for discharge. --Gabapentin 300 mg p.o. 3 times daily --Prozac 10 mg p.o. daily --Discontinued suicide precautions has been cleared by psychiatry on 2 occasions during this hospitalization --Would benefit from continued outpatient substance abuse program, social work for coordination  Chest pain Hx CAD/MI Patient reports chest pain on presentation, was noted to be significantly hypothermic secondary to cold exposure.  EKG without concerning ST elevation/depression or T wave inversions.  Troponin was minimally elevated which down trended which is inconsistent with ACS.  TTE with normal EF, no regional wall motion abnormalities.  Currently chest pain-free. --Continue to monitor on telemetry  Acute COPD exacerbation Currently oxygenating well on room air.  Completed 5-day course of prednisone 40 mg. --Breo Ellipta 1 puff daily --albuterol/duonebs prn  GERD: Continue PPI  Essential hypertension Was previously on Norvasc, held secondary to hypotension on presentation with associated hypothermia. --Continue to monitor BP closely, will restart Norvasc when appropriate  Elevated LFTs Etiology likely secondary to continued alcohol abuse.  Acute hepatitis panel negative.  LFTs improving.  Hypomagnesemia Repleted during hospitalization.  Leukocytosis Etiology likely secondary to steroid-induced from treatment of COPD exacerbation as above.  Tobacco use disorder: Continue nicotine patch  Weakness, debility, gait disturbance: Evaluated by PT, recommending SNF placement.  Social work for coordination.   DVT prophylaxis: Lovenox Code Status: Full code Family Communication: Discussed with  patient extensively at bedside Disposition Plan: Pending SNF placement, social work for  coordination    Consultants:   Psychiatry; 2/15, 2/17 - signed off; cleared for discharge by psychiatry  Procedures:   TTE 2/15: EF 60-65%, normal LV wall function, RV systolic function normal, IVC dilated.  Antimicrobials:   None   Subjective: Patient seen and examined at bedside, no complaints.  Eating breakfast.  Denies headache, no visual changes, no chest pain, palpitations, no nausea, vomiting, diarrhea, no cough, no weakness, no fatigue.  No acute events overnight per nursing staff.  Objective: Vitals:   09/19/19 1437 09/19/19 2045 09/20/19 0522 09/20/19 0719  BP: 103/61 124/78 (!) 151/86   Pulse: (!) 106 94 90 83  Resp: 14 19 20 18   Temp: 98.3 F (36.8 C) 97.7 F (36.5 C) 98.2 F (36.8 C)   TempSrc: Oral Oral Oral   SpO2: 96% 97% 95% 96%  Weight:      Height:        Intake/Output Summary (Last 24 hours) at 09/20/2019 0946 Last data filed at 09/20/2019 0815 Gross per 24 hour  Intake --  Output 950 ml  Net -950 ml   Filed Weights   09/13/19 0907  Weight: 68 kg    Examination:  General exam: Appears calm and comfortable, disheveled in appearance Respiratory system: Clear to auscultation. Respiratory effort normal.  No accessory muscle use, oxygenating well on room air Cardiovascular system: S1 & S2 heard, RRR. No JVD, murmurs, rubs, gallops or clicks. No pedal edema. Gastrointestinal system: Abdomen is nondistended, soft and nontender. No organomegaly or masses felt. Normal bowel sounds heard. Central nervous system: Alert and oriented. No focal neurological deficits. Extremities: Symmetric 5 x 5 power. Skin: No rashes, lesions or ulcers Psychiatry: Judgement and insight appear poor.  Depressed mood & flat affect.     Data Reviewed: I have personally reviewed following labs and imaging studies  CBC: Recent Labs  Lab 09/14/19 0327 09/15/19 0237 09/17/19 0441   WBC 7.2 11.1* 9.4  NEUTROABS  --  8.5*  --   HGB 10.0* 10.4* 10.6*  HCT 32.7* 32.7* 34.3*  MCV 88.6 86.5 87.9  PLT 202 233 [id number]   Basic Metabolic Panel: Recent Labs  Lab 09/13/19 1302 09/13/19 1302 09/14/19 0327 09/15/19 0237 09/17/19 0441 09/18/19 0442 09/20/19 0529  NA 138  --  139 138 138 139  --   K 3.3*  --  3.7 3.7 3.7 3.5  --   CL 108  --  112* 106 104 107  --   CO2 20*  --  20* 24 25 25   --   GLUCOSE 94  --  84 108* 81 92  --   BUN 7*  --  8 6* 13 17  --   CREATININE 0.62   < > 0.68 0.74 0.61 0.67 0.79  CALCIUM 7.0*  --  7.2* 8.0* 8.2* 8.2*  --   MG  --   --   --  1.5* 1.7 1.8  --   PHOS  --   --   --  3.0  --   --   --    < > = values in this interval not displayed.   GFR: Estimated Creatinine Clearance: 78.1 mL/min (by C-G formula based on SCr of 0.79 mg/dL). Liver Function Tests: Recent Labs  Lab 09/14/19 0849 09/15/19 0237 09/17/19 0441  AST 114* 63* 26  ALT 67* 60* 37  ALKPHOS 83 79 69  BILITOT 0.7 0.4 0.5  PROT 5.5* 6.0* 5.7*  ALBUMIN 2.5* 2.8* 2.6*   No results  for input(s): LIPASE, AMYLASE in the last 168 hours. No results for input(s): AMMONIA in the last 168 hours. Coagulation Profile: Recent Labs  Lab 09/14/19 0327  INR 0.9   Cardiac Enzymes: No results for input(s): CKTOTAL, CKMB, CKMBINDEX, TROPONINI in the last 168 hours. BNP (last 3 results) No results for input(s): PROBNP in the last 8760 hours. HbA1C: No results for input(s): HGBA1C in the last 72 hours. CBG: No results for input(s): GLUCAP in the last 168 hours. Lipid Profile: No results for input(s): CHOL, HDL, LDLCALC, TRIG, CHOLHDL, LDLDIRECT in the last 72 hours. Thyroid Function Tests: No results for input(s): TSH, T4TOTAL, FREET4, T3FREE, THYROIDAB in the last 72 hours. Anemia Panel: No results for input(s): VITAMINB12, FOLATE, FERRITIN, TIBC, IRON, RETICCTPCT in the last 72 hours. Sepsis Labs: Recent Labs  Lab 09/13/19 1302  LATICACIDVEN 1.5    Recent Results  (from the past 240 hour(s))  SARS CORONAVIRUS 2 (TAT 6-24 HRS) Nasopharyngeal Nasopharyngeal Swab     Status: None   Collection Time: 09/13/19  4:55 AM   Specimen: Nasopharyngeal Swab  Result Value Ref Range Status   SARS Coronavirus 2 NEGATIVE NEGATIVE Final    Comment: (NOTE) SARS-CoV-2 target nucleic acids are NOT DETECTED. The SARS-CoV-2 RNA is generally detectable in upper and lower respiratory specimens during the acute phase of infection. Negative results do not preclude SARS-CoV-2 infection, do not rule out co-infections with other pathogens, and should not be used as the sole basis for treatment or other patient management decisions. Negative results must be combined with clinical observations, patient history, and epidemiological information. The expected result is Negative. Fact Sheet for Patients: SugarRoll.be Fact Sheet for Healthcare Providers: https://www.woods-mathews.com/ This test is not yet approved or cleared by the [place] FDA and  has been authorized for detection and/or diagnosis of SARS-CoV-2 by FDA under an Emergency Use Authorization (EUA). This EUA will remain  in effect (meaning this test can be used) for the duration of the COVID-19 declaration under Section 56 4(b)(1) of the Act, 21 U.S.C. section 360bbb-3(b)(1), unless the authorization is terminated or revoked sooner. Performed at [facility], [place] [address] [place], [place] [address]   MRSA PCR Screening     Status: None   Collection Time: 09/13/19 12:50 PM   Specimen: Nasal Mucosa; Nasopharyngeal  Result Value Ref Range Status   MRSA by PCR NEGATIVE NEGATIVE Final    Comment:        The GeneXpert MRSA Assay (FDA approved for NASAL specimens only), is one component of a comprehensive MRSA colonization surveillance program. It is not intended to diagnose MRSA infection nor to guide or monitor treatment for MRSA infections. Performed at  [facility], [place] [address] [place], [place] [address]   Culture, Urine     Status: None   Collection Time: 09/13/19  4:12 PM   Specimen: Urine, Random  Result Value Ref Range Status   Specimen Description   Final    URINE, RANDOM Performed at [place] [address], [place], [place] [address]    Special Requests   Final    Normal Performed at [facility], [place] [address] [place], [place] [address]    Culture   Final    NO GROWTH Performed at [facility], [place] [address] [place], [place] [address]    Report Status 09/14/2019 FINAL  Final  Culture, blood (routine x 2)     Status: None  Collection Time: 09/13/19  4:53 PM   Specimen: BLOOD  Result Value Ref Range Status   Specimen Description BLOOD RIGHT ARM  Final   Special Requests   Final    BOTTLES DRAWN AEROBIC AND ANAEROBIC Blood Culture adequate volume Performed at [place] [address], [place], [place] [address]    Culture NO GROWTH 5 DAYS  Final   Report Status 09/19/2019 FINAL  Final  Culture, blood (routine x 2)     Status: None   Collection Time: 09/13/19  4:56 PM   Specimen: BLOOD  Result Value Ref Range Status   Specimen Description BLOOD RIGHT HAND  Final   Special Requests   Final    BOTTLES DRAWN AEROBIC AND ANAEROBIC Blood Culture adequate volume Performed at [place] [address] [place], [place] [address]    Culture NO GROWTH 5 DAYS  Final   Report Status 09/19/2019 FINAL  Final         Radiology Studies: No results found.      Scheduled Meds: . Chlorhexidine Gluconate Cloth  6 each Topical Q0600  . enoxaparin (LOVENOX) injection  40 mg Subcutaneous Q24H  . FLUoxetine  20 mg Oral Daily  . fluticasone furoate-vilanterol  1 puff Inhalation Daily  . folic acid  1 mg Oral Daily  . gabapentin  300 mg Oral TID  . multivitamin with minerals  1 tablet Oral Daily  .  nicotine  21 mg Transdermal Daily  . pantoprazole  40 mg Oral BID  . thiamine  100 mg Oral Daily   Or  . thiamine  100 mg Intravenous Daily  . traZODone  100 mg Oral QHS   Continuous Infusions:   LOS: 7 days    Time spent: 34 minutes spent on chart review, discussion with nursing staff, consultants, updating family and interview/physical exam; more than 50% of that time was spent in counseling and/or coordination of care.    Javious Hallisey J [place] DO [facility] Available via Epic secure chat 7am-7pm After these hours, please refer to coverage provider listed on [email or web address] 09/20/2019, 9:46 AM

## 2019-09-21 MED ORDER — POLYVINYL ALCOHOL 1.4 % OP SOLN
1.0000 [drp] | OPHTHALMIC | Status: DC | PRN
  Administered 2019-09-21: 1 [drp] via OPHTHALMIC
  Filled 2019-09-21: qty 15

## 2019-09-21 NOTE — Progress Notes (Signed)
PROGRESS NOTE    Samuel Reyes  [id number] DOB: 1956/02/06 DOA: 09/13/2019 PCP: [name], NP    Brief Narrative:   Samuel Reyes is a 64 y.o.malewith medical history significant ofCOPD, HTN, GERD, CVA, depression, CAD, alcoholism, h/o DT's, Barrett's esophagus found down near [place] campus in a yard. Was wet and cold and noted to have core temp of 87 degrees.   In ED treated with [facility], warmed fluids. Noted to have alcohol level of 327 and lactic acid of 2.8 without concomitant leukocytosis. Recent admission for the same 08/01/2019, with transfer to behavioral health hospital for 4 days of inpatient prior to discharge. He has known stressors including loss of child/wife, homelessness and ongoing alcohol use.   Assessment & Plan:   Principal Problem:   Hypothermia Active Problems:   COPD (chronic obstructive pulmonary disease) (HCC)   GERD (gastroesophageal reflux disease)   Alcohol intoxication ([place])   Benign essential HTN   Alcohol use disorder, severe, dependence (HCC)   Major depressive disorder, recurrent episode, moderate with anxious distress ([place])   Tobacco abuse   Homelessness   Hypothermia due to exposure   Hypothermia: Resolved Etiology likely secondary to cold exposure as he was found outside for likely prolonged period of time frame given his homelessness.  Was actively rewarmed and fluid resuscitated on presentation.  Urinalysis not suggestive of infection and without leukocytosis.  Urine culture with no growth.  Blood culture showed no growth x5 days.  Acute EtOH intoxication Patient with elevated EtOH level of 327 on admission.  History of significant DTs.  Was monitored during hospitalization under CIWAA protocol; no signs of withdrawal symptoms.  Now will discontinue CIWAA protocol. --Thiamine, folate, multivitamin  Depression/suicidal ideation Patient with long history of depression and chronic suicidal ideations.  Recent admission to  behavioral health Hospital early January, states thoughts of suicidal ideation never really go away.  Has reportedly thoughts of intense with cutting wrists and running into traffic.  Has been seen by psychiatry on 2 occasions during this hospitalization on 09/14/2019 and 09/16/2019; and cleared psychiatrically for discharge. --Gabapentin 300 mg p.o. 3 times daily --Prozac 10 mg p.o. daily --Discontinued suicide precautions has been cleared by psychiatry on 2 occasions during this hospitalization --Would benefit from continued outpatient substance abuse program, social work for coordination  Chest pain Hx CAD/MI Patient reports chest pain on presentation, was noted to be significantly hypothermic secondary to cold exposure.  EKG without concerning ST elevation/depression or T wave inversions.  Troponin was minimally elevated which down trended which is inconsistent with ACS.  TTE with normal EF, no regional wall motion abnormalities.  Currently chest pain-free. --Continue to monitor on telemetry  Acute COPD exacerbation Currently oxygenating well on room air.  Completed 5-day course of prednisone 40 mg. --Breo Ellipta 1 puff daily --albuterol/duonebs prn  GERD: Continue PPI  Essential hypertension Was previously on Norvasc, held secondary to hypotension on presentation with associated hypothermia. --Continue to monitor BP closely, will restart Norvasc when appropriate  Elevated LFTs Etiology likely secondary to continued alcohol abuse.  Acute hepatitis panel negative.  LFTs improving.  Hypomagnesemia Repleted during hospitalization.  Leukocytosis Etiology likely secondary to steroid-induced from treatment of COPD exacerbation as above.  Tobacco use disorder: Continue nicotine patch  Weakness, debility, gait disturbance: Evaluated by PT, recommending SNF placement.  Social work for coordination.   DVT prophylaxis: Lovenox Code Status: Full code Family Communication: Discussed with  patient extensively at bedside Disposition Plan: Pending SNF placement, social work for  coordination.  Barriers to discharge, lack of insurance, homelessness; currently needing SNF level of care per PT/OT   Consultants:   Psychiatry; 2/15, 2/17 - signed off; cleared for discharge by psychiatry  Procedures:   TTE 2/15: EF 60-65%, normal LV wall function, RV systolic function normal, IVC dilated.  Antimicrobials:   None   Subjective: Patient seen and examined at bedside, no complaints.  Eating breakfast.  Chronic pain better controlled.  Continues with nonproductive cough; improved with cough medicine.  Denies headache, no visual changes, no chest pain, palpitations, no nausea, vomiting, diarrhea, no weakness, no fatigue.  No acute events overnight per nursing staff.  Objective: Vitals:   09/20/19 1611 09/20/19 2019 09/21/19 0523 09/21/19 0728  BP: 125/72 126/76 132/80   Pulse: 88 87 87   Resp: 18 19 19    Temp: 98.1 F (36.7 C) 98.3 F (36.8 C) (!) 97.3 F (36.3 C)   TempSrc: Oral Oral Oral   SpO2: 98% 97% 96% 98%  Weight:      Height:        Intake/Output Summary (Last 24 hours) at 09/21/2019 1051 Last data filed at 09/21/2019 0900 Gross per 24 hour  Intake 600 ml  Output 1750 ml  Net -1150 ml   Filed Weights   09/13/19 0907  Weight: 68 kg    Examination:  General exam: Appears calm and comfortable, disheveled in appearance Respiratory system: Clear to auscultation. Respiratory effort normal.  No accessory muscle use, oxygenating well on room air Cardiovascular system: S1 & S2 heard, RRR. No JVD, murmurs, rubs, gallops or clicks. No pedal edema. Gastrointestinal system: Abdomen is nondistended, soft and nontender. No organomegaly or masses felt. Normal bowel sounds heard. Central nervous system: Alert and oriented. No focal neurological deficits. Extremities: Symmetric 5 x 5 power. Skin: No rashes, lesions or ulcers Psychiatry: Judgement and insight appear poor.   Depressed mood & flat affect.     Data Reviewed: I have personally reviewed following labs and imaging studies  CBC: Recent Labs  Lab 09/15/19 0237 09/17/19 0441  WBC 11.1* 9.4  NEUTROABS 8.5*  --   HGB 10.4* 10.6*  HCT 32.7* 34.3*  MCV 86.5 87.9  PLT 233 [id number]   Basic Metabolic Panel: Recent Labs  Lab 09/15/19 0237 09/17/19 0441 09/18/19 0442 09/20/19 0529  NA 138 138 139  --   K 3.7 3.7 3.5  --   CL 106 104 107  --   CO2 24 25 25   --   GLUCOSE 108* 81 92  --   BUN 6* 13 17  --   CREATININE 0.74 0.61 0.67 0.79  CALCIUM 8.0* 8.2* 8.2*  --   MG 1.5* 1.7 1.8  --   PHOS 3.0  --   --   --    GFR: Estimated Creatinine Clearance: 78.1 mL/min (by C-G formula based on SCr of 0.79 mg/dL). Liver Function Tests: Recent Labs  Lab 09/15/19 0237 09/17/19 0441  AST 63* 26  ALT 60* 37  ALKPHOS 79 69  BILITOT 0.4 0.5  PROT 6.0* 5.7*  ALBUMIN 2.8* 2.6*   No results for input(s): LIPASE, AMYLASE in the last 168 hours. No results for input(s): AMMONIA in the last 168 hours. Coagulation Profile: No results for input(s): INR, PROTIME in the last 168 hours. Cardiac Enzymes: No results for input(s): CKTOTAL, CKMB, CKMBINDEX, TROPONINI in the last 168 hours. BNP (last 3 results) No results for input(s): PROBNP in the last 8760 hours. HbA1C: No results for input(s): HGBA1C  in the last 72 hours. CBG: No results for input(s): GLUCAP in the last 168 hours. Lipid Profile: No results for input(s): CHOL, HDL, LDLCALC, TRIG, CHOLHDL, LDLDIRECT in the last 72 hours. Thyroid Function Tests: No results for input(s): TSH, T4TOTAL, FREET4, T3FREE, THYROIDAB in the last 72 hours. Anemia Panel: No results for input(s): VITAMINB12, FOLATE, FERRITIN, TIBC, IRON, RETICCTPCT in the last 72 hours. Sepsis Labs: No results for input(s): PROCALCITON, LATICACIDVEN in the last 168 hours.  Recent Results (from the past 240 hour(s))  SARS CORONAVIRUS 2 (TAT 6-24 HRS) Nasopharyngeal Nasopharyngeal  Swab     Status: None   Collection Time: 09/13/19  4:55 AM   Specimen: Nasopharyngeal Swab  Result Value Ref Range Status   SARS Coronavirus 2 NEGATIVE NEGATIVE Final    Comment: (NOTE) SARS-CoV-2 target nucleic acids are NOT DETECTED. The SARS-CoV-2 RNA is generally detectable in upper and lower respiratory specimens during the acute phase of infection. Negative results do not preclude SARS-CoV-2 infection, do not rule out co-infections with other pathogens, and should not be used as the sole basis for treatment or other patient management decisions. Negative results must be combined with clinical observations, patient history, and epidemiological information. The expected result is Negative. Fact Sheet for Patients: SugarRoll.be Fact Sheet for Healthcare Providers: https://www.woods-mathews.com/ This test is not yet approved or cleared by the [place] FDA and  has been authorized for detection and/or diagnosis of SARS-CoV-2 by FDA under an Emergency Use Authorization (EUA). This EUA will remain  in effect (meaning this test can be used) for the duration of the COVID-19 declaration under Section 56 4(b)(1) of the Act, 21 U.S.C. section 360bbb-3(b)(1), unless the authorization is terminated or revoked sooner. Performed at [facility], [place] [address], [place], [place] [address]   MRSA PCR Screening     Status: None   Collection Time: 09/13/19 12:50 PM   Specimen: Nasal Mucosa; Nasopharyngeal  Result Value Ref Range Status   MRSA by PCR NEGATIVE NEGATIVE Final    Comment:        The GeneXpert MRSA Assay (FDA approved for NASAL specimens only), is one component of a comprehensive MRSA colonization surveillance program. It is not intended to diagnose MRSA infection nor to guide or monitor treatment for MRSA infections. Performed at [facility], [place] [address], [place], [place] [address]   Culture, Urine      Status: None   Collection Time: 09/13/19  4:12 PM   Specimen: Urine, Random  Result Value Ref Range Status   Specimen Description   Final    URINE, RANDOM Performed at [place] [address] [place], [place] [address]    Special Requests   Final    Normal Performed at [facility], [place] [address] [place], [place] [address]    Culture   Final    NO GROWTH Performed at [facility], [place] [address], [place], [place] [address]    Report Status 09/14/2019 FINAL  Final  Culture, blood (routine x 2)     Status: None   Collection Time: 09/13/19  4:53 PM   Specimen: BLOOD  Result Value Ref Range Status   Specimen Description BLOOD RIGHT ARM  Final   Special Requests   Final    BOTTLES DRAWN AEROBIC AND ANAEROBIC Blood Culture adequate volume Performed at [facility] [address], [place], [place] [address]    Culture NO GROWTH 5 DAYS  Final  Report Status 09/19/2019 FINAL  Final  Culture, blood (routine x 2)     Status: None   Collection Time: 09/13/19  4:56 PM   Specimen: BLOOD  Result Value Ref Range Status   Specimen Description BLOOD RIGHT HAND  Final   Special Requests   Final    BOTTLES DRAWN AEROBIC AND ANAEROBIC Blood Culture adequate volume Performed at [facility] [address] [place], [place] [address]    Culture NO GROWTH 5 DAYS  Final   Report Status 09/19/2019 FINAL  Final         Radiology Studies: No results found.      Scheduled Meds: . Chlorhexidine Gluconate Cloth  6 each Topical Q0600  . enoxaparin (LOVENOX) injection  40 mg Subcutaneous Q24H  . FLUoxetine  20 mg Oral Daily  . fluticasone furoate-vilanterol  1 puff Inhalation Daily  . folic acid  1 mg Oral Daily  . gabapentin  300 mg Oral TID  . multivitamin with minerals  1 tablet Oral Daily  . nicotine  21 mg Transdermal Daily  . pantoprazole  40 mg Oral BID  . thiamine  100 mg Oral Daily    Or  . thiamine  100 mg Intravenous Daily  . traZODone  100 mg Oral QHS   Continuous Infusions:   LOS: 8 days    Time spent: 34 minutes spent on chart review, discussion with nursing staff, consultants, updating family and interview/physical exam; more than 50% of that time was spent in counseling and/or coordination of care.    Eric J [place] DO [facility] Available via Epic secure chat 7am-7pm After these hours, please refer to coverage provider listed on [email or web address] 09/21/2019, 10:51 AM

## 2019-09-21 NOTE — Progress Notes (Signed)
Physical Therapy Treatment Patient Details Name: Samuel Reyes MRN: [id number] DOB: [birth year] Today's Date: 09/21/2019    History of Present Illness 64 y.o. male with medical history significant of COPD, HTN, GERD, CVA, depression, CAD, alcoholism, h/o DT's, Barrett's esophagus found down near [employer] [facility] in a yard. Was wet and cold and noted to have core temp of 87 degrees. In ED treated with [facility], warmed fluids. Noted to have alcohol level of 327 and lactic acid of 2.8 without concomitant leukocytosis. Recent admission for the same  08/01/2019, with transfer to behavioral health hospital for 4 days of inpatient prior to d/c. He has known stressors including loss of child and wife and homelessness and ongoing alcohol use..    PT Comments    Assisted OOB to amb in hallway.  General Gait Details: stayed with walker this session due to c/o general fatigue.  Tolerated an increased distance. Assisted to bathroom.  Pt had a large BM.  Assisted back to bed.   Follow Up Recommendations  SNF     Equipment Recommendations  Rolling walker with 5" wheels;Cane    Recommendations for Other Services       Precautions / Restrictions Precautions Precautions: Fall Precaution Comments: "bad" R knee Restrictions Weight Bearing Restrictions: No Other Position/Activity Restrictions: WBAT    Mobility  Bed Mobility Overal bed mobility: Modified Independent                Transfers Overall transfer level: Needs assistance Equipment used: Rolling walker (2 wheeled);None Transfers: Sit to/from Stand Sit to Stand: Min assist         General transfer comment: assist to rise with silght unsteadiness.  Feeling "weak".  Ambulation/Gait Ambulation/Gait assistance: Min assist Gait Distance (Feet): 125 Feet Assistive device: Rolling walker (2 wheeled);None Gait Pattern/deviations: Step-through pattern;Decreased stride length;Decreased stance time - right;Shuffle Gait velocity:  decreased   General Gait Details: stayed with walker this session due to c/o general fatigue.  Tolerated an increased distance.   Stairs             Wheelchair Mobility    Modified Rankin (Stroke Patients Only)       Balance                                            Cognition Arousal/Alertness: Awake/alert Behavior During Therapy: WFL for tasks assessed/performed Overall Cognitive Status: Within Functional Limits for tasks assessed                                 General Comments: AxO x 3 sharing his story about being in prison.      Exercises      General Comments        Pertinent Vitals/Pain Pain Assessment: Faces Faces Pain Scale: Hurts a little bit Pain Location: general aches Pain Descriptors / Indicators: Aching Pain Intervention(s): Monitored during session    Home Living                      Prior Function            PT Goals (current goals can now be found in the care plan section) Progress towards PT goals: Progressing toward goals    Frequency    Min 2X/week      PT Plan Current  plan remains appropriate    Co-evaluation              AM-PAC PT "6 Clicks" Mobility   Outcome Measure  Help needed turning from your back to your side while in a flat bed without using bedrails?: None Help needed moving from lying on your back to sitting on the side of a flat bed without using bedrails?: A Little Help needed moving to and from a bed to a chair (including a wheelchair)?: A Little Help needed standing up from a chair using your arms (e.g., wheelchair or bedside chair)?: A Little Help needed to walk in hospital room?: A Little Help needed climbing 3-5 steps with a railing? : A Little 6 Click Score: 19    End of Session Equipment Utilized During Treatment: Gait belt Activity Tolerance: Patient tolerated treatment well Patient left: in bed;with call bell/phone within reach;with bed alarm  set;with nursing/sitter in room Nurse Communication: Mobility status PT Visit Diagnosis: Other abnormalities of gait and mobility (R26.89);Unsteadiness on feet (R26.81)     Time: 1525-1540 PT Time Calculation (min) (ACUTE ONLY): 15 min  Charges:  $Gait Training: 8-22 mins                     Rica Koyanagi  PTA Acute  Rehabilitation Services Pager      [phone] Office      [phone]

## 2019-09-21 NOTE — TOC Progression Note (Signed)
Transition of Care [facility]) - Progression Note    Patient Details  Name: Samuel Reyes MRN: [id number] Date of Birth: Apr 01, 1956  Transition of Care [facility]) CM/SW [place], [place] Phone Number: 09/21/2019, 2:30 PM  Clinical Narrative:   Although Levada Dy in admissions at [facility] is not working today, I left a message asking her to review case.  Bed offer made.  I called back to try to hammer out details, left message again. TOC will continue to follow during the course of hospitalization.     Expected Discharge Plan: Skilled Nursing Facility Barriers to Discharge: SNF Pending bed offer  Expected Discharge Plan and Services Expected Discharge Plan: [place] In-house Referral: Clinical Social Work     Living arrangements for the past 2 months: Homeless                                       Social Determinants of Health ([place]) Interventions    Readmission Risk Interventions No flowsheet data found.

## 2019-09-21 NOTE — NC FL2 (Signed)
[place] LEVEL OF CARE SCREENING TOOL     IDENTIFICATION  Patient Name: Samuel Reyes Birthdate: July 08, 1956 Sex: male Admission Date (Current Location): 09/13/2019  [facility] and [place] Number:  Herbalist and Address:  [facility],  [place], [place]      Provider Number: [id number]  Attending Physician Name and Address:  [place] Donnamarie Poag, DO  Relative Name and Phone Number:       Current Level of Care: Hospital Recommended Level of Care: [place] Prior Approval Number:    Date Approved/Denied:   PASRR Number: [id number]  Discharge Plan: SNF    Current Diagnoses: Patient Active Problem List   Diagnosis Date Noted  . Hypothermia due to exposure 09/13/2019  . Hypothermia 08/01/2019  . QT prolongation 09/09/2018  . Overdose 09/06/2018  . Alcohol withdrawal ([place]) 08/08/2017  . Homelessness 06/24/2017  . Lung nodule 09/03/2016  . Tobacco abuse 09/03/2016  . Insomnia 09/03/2016  . Malnutrition of moderate degree 08/04/2016  . Atrial tachycardia ([place]) 08/02/2016  . Impaired glucose tolerance 08/02/2016  . Alcohol use with alcohol-induced mood disorder ([place]) 12/12/2015  . Major depressive disorder, recurrent episode, moderate with anxious distress ([place]) 10/30/2015  . Alcohol use disorder, severe, dependence ([place]) 10/29/2015  . Suicidal ideation 09/17/2015  . Alcohol intoxication ([place]) 09/17/2015  . Benign essential HTN 09/17/2015  . COPD (chronic obstructive pulmonary disease) ([place]) 02/17/2013  . GERD (gastroesophageal reflux disease) 02/17/2013    Orientation RESPIRATION BLADDER Height & Weight     Self, Situation, Place  Normal External catheter Weight: 68 kg Height:  5\' 4"  (162.6 cm)  BEHAVIORAL SYMPTOMS/MOOD NEUROLOGICAL BOWEL NUTRITION STATUS  (none) (none) Continent Diet(see d/c summary)  AMBULATORY STATUS COMMUNICATION OF NEEDS Skin   Extensive Assist Verbally Normal                     Personal Care Assistance Level of Assistance  Bathing, Feeding, Dressing Bathing Assistance: Limited assistance Feeding assistance: Independent Dressing Assistance: Limited assistance     Functional Limitations Info  Sight, Hearing, Speech Sight Info: Adequate Hearing Info: Adequate Speech Info: Adequate    SPECIAL CARE FACTORS FREQUENCY  PT (By licensed PT), OT (By licensed OT)     PT Frequency: 5X/W OT Frequency: 5X/W            Contractures Contractures Info: Not present    Additional Factors Info  Code Status, Allergies Code Status Info: full Allergies Info: Bee venom, Penicillins, Vancomycin           Current Medications (09/21/2019):  This is the current hospital active medication list Current Facility-Administered Medications  Medication Dose Route Frequency Provider Last Rate Last Admin  . albuterol (PROVENTIL) (2.5 MG/3ML) 0.083% nebulizer solution 2.5 mg  2.5 mg Nebulization Q6H PRN Donnamae Jude, MD      . alum & mag hydroxide-simeth (MAALOX/MYLANTA) 200-200-20 MG/5ML suspension 15 mL  15 mL Oral Q4H PRN [place] Eric J, DO   15 mL at 09/17/19 1818  . Chlorhexidine Gluconate Cloth 2 % PADS 6 each  6 each Topical Q0600 Donnamae Jude, MD   6 each at 09/21/19 [phone]  . cyclobenzaprine (FLEXERIL) tablet 10 mg  10 mg Oral TID PRN Donnamae Jude, MD   10 mg at 09/20/19 1505  . enoxaparin (LOVENOX) injection 40 mg  40 mg Subcutaneous Q24H Donnamae Jude, MD   40 mg at 09/20/19 0929  . FLUoxetine (PROZAC) capsule 20  mg  20 mg Oral Daily Elodia Florence., MD   20 mg at 09/20/19 0932  . fluticasone furoate-vilanterol (BREO ELLIPTA) 200-25 MCG/INH 1 puff  1 puff Inhalation Daily Donnamae Jude, MD   1 puff at 09/21/19 [phone]  . folic acid (FOLVITE) tablet 1 mg  1 mg Oral Daily Donnamae Jude, MD   1 mg at 09/20/19 0933  . gabapentin (NEURONTIN) capsule 300 mg  300 mg Oral TID Donnamae Jude, MD   300 mg at 09/20/19 2118  . guaiFENesin-dextromethorphan  (ROBITUSSIN DM) 100-10 MG/5ML syrup 5 mL  5 mL Oral Q4H PRN Elodia Florence., MD   5 mL at 09/20/19 0934  . HYDROcodone-acetaminophen (NORCO/VICODIN) 5-325 MG per tablet 1 tablet  1 tablet Oral Q8H PRN [place] Eric J, DO   1 tablet at 09/21/19 [id number]  . ibuprofen (ADVIL) tablet 600 mg  600 mg Oral Q6H PRN Donnamae Jude, MD   600 mg at 09/20/19 0934  . multivitamin with minerals tablet 1 tablet  1 tablet Oral Daily Donnamae Jude, MD   1 tablet at 09/20/19 0933  . nicotine (NICODERM CQ - dosed in mg/24 hours) patch 21 mg  21 mg Transdermal Daily Donnamae Jude, MD   21 mg at 09/20/19 0935  . pantoprazole (PROTONIX) EC tablet 40 mg  40 mg Oral BID Donnamae Jude, MD   40 mg at 09/20/19 2118  . senna-docusate (Senokot-S) tablet 1 tablet  1 tablet Oral QHS PRN Donnamae Jude, MD      . thiamine tablet 100 mg  100 mg Oral Daily Donnamae Jude, MD   100 mg at 09/20/19 [id number]   Or  . thiamine (B-1) injection 100 mg  100 mg Intravenous Daily Donnamae Jude, MD      . traZODone (DESYREL) tablet 100 mg  100 mg Oral QHS [place] Eric J, DO   100 mg at 09/20/19 2118     Discharge Medications: Please see discharge summary for a list of discharge medications.  Relevant Imaging Results:  Relevant Lab Results:   Additional Information [place], [place]

## 2019-09-22 LAB — SARS CORONAVIRUS 2 (TAT 6-24 HRS): SARS Coronavirus 2: NEGATIVE

## 2019-09-22 NOTE — Plan of Care (Signed)
°  Problem: Education: °Goal: Knowledge of disease or condition will improve °Outcome: Progressing °Goal: Understanding of discharge needs will improve °Outcome: Progressing °  °Problem: Health Behavior/Discharge Planning: °Goal: Ability to identify changes in lifestyle to reduce recurrence of condition will improve °Outcome: Progressing °Goal: Identification of resources available to assist in meeting health care needs will improve °Outcome: Progressing °  °Problem: Physical Regulation: °Goal: Complications related to the disease process, condition or treatment will be avoided or minimized °Outcome: Progressing °  °Problem: Safety: °Goal: Ability to remain free from injury will improve °Outcome: Progressing °  °

## 2019-09-22 NOTE — Progress Notes (Signed)
PROGRESS NOTE    Samuel Reyes  [id number] DOB: Jun 12, 1956 DOA: 09/13/2019 PCP: [name], NP    Brief Narrative:   Samuel Reyes is a 64 y.o.malewith medical history significant ofCOPD, HTN, GERD, CVA, depression, CAD, alcoholism, h/o DT's, Barrett's esophagus found down near [facility] in a yard. Was wet and cold and noted to have core temp of 87 degrees.   In ED treated with [facility], warmed fluids. Noted to have alcohol level of 327 and lactic acid of 2.8 without concomitant leukocytosis. Recent admission for the same 08/01/2019, with transfer to behavioral health hospital for 4 days of inpatient prior to discharge. He has known stressors including loss of child/wife, homelessness and ongoing alcohol use.   Assessment & Plan:   Principal Problem:   Hypothermia Active Problems:   COPD (chronic obstructive pulmonary disease) (HCC)   GERD (gastroesophageal reflux disease)   Alcohol intoxication ([place])   Benign essential HTN   Alcohol use disorder, severe, dependence (HCC)   Major depressive disorder, recurrent episode, moderate with anxious distress ([place])   Tobacco abuse   Homelessness   Hypothermia due to exposure   Hypothermia: Resolved Etiology likely secondary to cold exposure as he was found outside for likely prolonged period of time frame given his homelessness.  Was actively rewarmed and fluid resuscitated on presentation.  Urinalysis not suggestive of infection and without leukocytosis.  Urine culture with no growth.  Blood culture showed no growth x5 days.  Acute EtOH intoxication Patient with elevated EtOH level of 327 on admission.  History of significant DTs.  Was monitored during hospitalization under [facility] protocol; no signs of withdrawal symptoms.  Now will discontinue [facility] protocol. --Thiamine, folate, multivitamin  Depression/suicidal ideation Patient with long history of depression and chronic suicidal ideations.  Recent admission to  behavioral health Hospital early January, states thoughts of suicidal ideation never really go away.  Has reportedly thoughts of intense with cutting wrists and running into traffic.  Has been seen by psychiatry on 2 occasions during this hospitalization on 09/14/2019 and 09/16/2019; and cleared psychiatrically for discharge. --Gabapentin 300 mg p.o. 3 times daily --Prozac 10 mg p.o. daily --Discontinued suicide precautions has been cleared by psychiatry on 2 occasions during this hospitalization --Would benefit from continued outpatient substance abuse program, social work for coordination  Chest pain Hx CAD/MI Patient reports chest pain on presentation, was noted to be significantly hypothermic secondary to cold exposure.  EKG without concerning ST elevation/depression or T wave inversions.  Troponin was minimally elevated which down trended which is inconsistent with ACS.  TTE with normal EF, no regional wall motion abnormalities.  Currently chest pain-free. --Continue to monitor on telemetry  Acute COPD exacerbation Currently oxygenating well on room air.  Completed 5-day course of prednisone 40 mg. --Breo Ellipta 1 puff daily --albuterol/duonebs prn  GERD: Continue PPI  Essential hypertension Was previously on Norvasc, held secondary to hypotension on presentation with associated hypothermia. --Continue to monitor BP closely, will restart Norvasc when appropriate  Elevated LFTs Etiology likely secondary to continued alcohol abuse.  Acute hepatitis panel negative.  LFTs improving.  Hypomagnesemia Repleted during hospitalization.  Leukocytosis Etiology likely secondary to steroid-induced from treatment of COPD exacerbation as above.  Tobacco use disorder: Continue nicotine patch  Weakness, debility, gait disturbance: Evaluated by PT, recommending SNF placement.  Plan discharge to [facility] on 09/23/2019.   DVT prophylaxis: Lovenox Code Status: Full code Family  Communication: Discussed with patient extensively at bedside Disposition Plan: Pending discharge to  [facility] on 09/23/2019, repeat Covid-19 test pending.   Consultants:   Psychiatry; 2/15, 2/17 - signed off; cleared for discharge by psychiatry  Procedures:   TTE 2/15: EF 60-65%, normal LV wall function, RV systolic function normal, IVC dilated.  Antimicrobials:   None   Subjective: Patient seen and examined at bedside, eating breakfast. Continues with nonproductive cough; improved with cough medicine.  Denies headache, no visual changes, no chest pain, palpitations, no nausea, vomiting, diarrhea, no weakness, no fatigue.  No acute events overnight per nursing staff.  Objective: Vitals:   09/21/19 1805 09/21/19 2105 09/22/19 0528 09/22/19 0741  BP: (!) 141/82 (!) 148/90 127/74   Pulse: 91 84 82   Resp: 19 18 20    Temp: 98.4 F (36.9 C) 98 F (36.7 C) 98 F (36.7 C)   TempSrc: Oral Oral Oral   SpO2: 98% 97% 95% 99%  Weight:      Height:        Intake/Output Summary (Last 24 hours) at 09/22/2019 1034 Last data filed at 09/22/2019 1026 [facility] per 24 hour  Intake 840 ml  Output 875 ml  Net -35 ml   Filed Weights   09/13/19 0907  Weight: 68 kg    Examination:  General exam: Appears calm and comfortable, disheveled in appearance Respiratory system: Clear to auscultation. Respiratory effort normal.  No accessory muscle use, oxygenating well on room air Cardiovascular system: S1 & S2 heard, RRR. No JVD, murmurs, rubs, gallops or clicks. No pedal edema. Gastrointestinal system: Abdomen is nondistended, soft and nontender. No organomegaly or masses felt. Normal bowel sounds heard. Central nervous system: Alert and oriented. No focal neurological deficits. Extremities: Symmetric 5 x 5 power. Skin: No rashes, lesions or ulcers Psychiatry: Judgement and insight appear poor.  Depressed mood & flat affect.     Data Reviewed: I have personally reviewed following labs  and imaging studies  CBC: Recent Labs  Lab 09/17/19 0441  WBC 9.4  HGB 10.6*  HCT 34.3*  MCV 87.9  PLT [id number]   Basic Metabolic Panel: Recent Labs  Lab 09/17/19 0441 09/18/19 0442 09/20/19 0529  NA 138 139  --   K 3.7 3.5  --   CL 104 107  --   CO2 25 25  --   GLUCOSE 81 92  --   BUN 13 17  --   CREATININE 0.61 0.67 0.79  CALCIUM 8.2* 8.2*  --   MG 1.7 1.8  --    GFR: Estimated Creatinine Clearance: 78.1 mL/min (by C-G formula based on SCr of 0.79 mg/dL). Liver Function Tests: Recent Labs  Lab 09/17/19 0441  AST 26  ALT 37  ALKPHOS 69  BILITOT 0.5  PROT 5.7*  ALBUMIN 2.6*   No results for input(s): LIPASE, AMYLASE in the last 168 hours. No results for input(s): AMMONIA in the last 168 hours. Coagulation Profile: No results for input(s): INR, PROTIME in the last 168 hours. Cardiac Enzymes: No results for input(s): CKTOTAL, CKMB, CKMBINDEX, TROPONINI in the last 168 hours. BNP (last 3 results) No results for input(s): PROBNP in the last 8760 hours. HbA1C: No results for input(s): HGBA1C in the last 72 hours. CBG: No results for input(s): GLUCAP in the last 168 hours. Lipid Profile: No results for input(s): CHOL, HDL, LDLCALC, TRIG, CHOLHDL, LDLDIRECT in the last 72 hours. Thyroid Function Tests: No results for input(s): TSH, T4TOTAL, FREET4, T3FREE, THYROIDAB in the last 72 hours. Anemia Panel: No results for input(s): VITAMINB12, FOLATE, FERRITIN, TIBC, IRON,  RETICCTPCT in the last 72 hours. Sepsis Labs: No results for input(s): PROCALCITON, LATICACIDVEN in the last 168 hours.  Recent Results (from the past 240 hour(s))  SARS CORONAVIRUS 2 (TAT 6-24 HRS) Nasopharyngeal Nasopharyngeal Swab     Status: None   Collection Time: 09/13/19  4:55 AM   Specimen: Nasopharyngeal Swab  Result Value Ref Range Status   SARS Coronavirus 2 NEGATIVE NEGATIVE Final    Comment: (NOTE) SARS-CoV-2 target nucleic acids are NOT DETECTED. The SARS-CoV-2 RNA is generally  detectable in upper and lower respiratory specimens during the acute phase of infection. Negative results do not preclude SARS-CoV-2 infection, do not rule out co-infections with other pathogens, and should not be used as the sole basis for treatment or other patient management decisions. Negative results must be combined with clinical observations, patient history, and epidemiological information. The expected result is Negative. Fact Sheet for Patients: SugarRoll.be Fact Sheet for Healthcare Providers: https://www.woods-mathews.com/ This test is not yet approved or cleared by the [place] FDA and  has been authorized for detection and/or diagnosis of SARS-CoV-2 by FDA under an Emergency Use Authorization (EUA). This EUA will remain  in effect (meaning this test can be used) for the duration of the COVID-19 declaration under Section 56 4(b)(1) of the Act, 21 U.S.C. section 360bbb-3(b)(1), unless the authorization is terminated or revoked sooner. Performed at [facility], [place] [address] [place], [place] [address]   MRSA PCR Screening     Status: None   Collection Time: 09/13/19 12:50 PM   Specimen: Nasal Mucosa; Nasopharyngeal  Result Value Ref Range Status   MRSA by PCR NEGATIVE NEGATIVE Final    Comment:        The GeneXpert MRSA Assay (FDA approved for NASAL specimens only), is one component of a comprehensive MRSA colonization surveillance program. It is not intended to diagnose MRSA infection nor to guide or monitor treatment for MRSA infections. Performed at [facility], [place] [address] [place], [place] [address]   Culture, Urine     Status: None   Collection Time: 09/13/19  4:12 PM   Specimen: Urine, Random  Result Value Ref Range Status   Specimen Description   Final    URINE, RANDOM Performed at [facility] [address], [place], [place] [address]    Special Requests    Final    Normal Performed at [facility], [place] [address] [place], [place] [address]    Culture   Final    NO GROWTH Performed at [facility], [place] [address] [place], [place] [address]    Report Status 09/14/2019 FINAL  Final  Culture, blood (routine x 2)     Status: None   Collection Time: 09/13/19  4:53 PM   Specimen: BLOOD  Result Value Ref Range Status   Specimen Description BLOOD RIGHT ARM  Final   Special Requests   Final    BOTTLES DRAWN AEROBIC AND ANAEROBIC Blood Culture adequate volume Performed at [place] [address] [place],  [address]    Culture NO GROWTH 5 DAYS  Final   Report Status 09/19/2019 FINAL  Final  Culture, blood (routine x 2)     Status: None   Collection Time: 09/13/19  4:56 PM   Specimen: BLOOD  Result Value Ref Range Status   Specimen Description BLOOD RIGHT HAND  Final   Special Requests   Final    BOTTLES DRAWN AEROBIC AND  ANAEROBIC Blood Culture adequate volume Performed at [facility] [address] [place], [place] [address]    Culture NO GROWTH 5 DAYS  Final   Report Status 09/19/2019 FINAL  Final         Radiology Studies: No results found.      Scheduled Meds: . Chlorhexidine Gluconate Cloth  6 each Topical Q0600  . enoxaparin (LOVENOX) injection  40 mg Subcutaneous Q24H  . FLUoxetine  20 mg Oral Daily  . fluticasone furoate-vilanterol  1 puff Inhalation Daily  . folic acid  1 mg Oral Daily  . gabapentin  300 mg Oral TID  . multivitamin with minerals  1 tablet Oral Daily  . nicotine  21 mg Transdermal Daily  . pantoprazole  40 mg Oral BID  . thiamine  100 mg Oral Daily   Or  . thiamine  100 mg Intravenous Daily  . traZODone  100 mg Oral QHS   Continuous Infusions:   LOS: 9 days    Time spent: 34 minutes spent on chart review, discussion with nursing staff, consultants, updating family and interview/physical exam; more than 50% of  that time was spent in counseling and/or coordination of care.    Zebulen Simonis J [place] DO [facility] Available via Epic secure chat 7am-7pm After these hours, please refer to coverage provider listed on [email or web address] 09/22/2019, 10:34 AM

## 2019-09-23 DIAGNOSIS — F10920 Alcohol use, unspecified with intoxication, uncomplicated: Secondary | ICD-10-CM

## 2019-09-23 MED ORDER — TRAZODONE HCL 100 MG PO TABS: 100.0000 mg | ORAL_TABLET | Freq: Every day | ORAL | 0 refills | Status: AC

## 2019-09-23 MED ORDER — PANTOPRAZOLE SODIUM 40 MG PO TBEC: 40.0000 mg | DELAYED_RELEASE_TABLET | Freq: Two times a day (BID) | ORAL | 0 refills | Status: AC

## 2019-09-23 MED ORDER — FLUTICASONE FUROATE-VILANTEROL 200-25 MCG/INH IN AEPB: 1.0000 | INHALATION_SPRAY | Freq: Every day | RESPIRATORY_TRACT | 0 refills | Status: DC

## 2019-09-23 MED ORDER — HYDROCODONE-ACETAMINOPHEN 5-325 MG PO TABS: 1.0000 | ORAL_TABLET | Freq: Three times a day (TID) | ORAL | 0 refills | Status: DC | PRN

## 2019-09-23 MED ORDER — NICOTINE 21 MG/24HR TD PT24: 21.0000 mg | MEDICATED_PATCH | Freq: Every day | TRANSDERMAL | 0 refills | Status: AC

## 2019-09-23 MED ORDER — FLUOXETINE HCL 20 MG PO CAPS: 20.0000 mg | ORAL_CAPSULE | Freq: Every day | ORAL | 0 refills | Status: AC

## 2019-09-23 MED ORDER — ADULT MULTIVITAMIN W/MINERALS CH: 1.0000 | ORAL_TABLET | Freq: Every day | ORAL | 0 refills | Status: DC

## 2019-09-23 MED ORDER — THIAMINE HCL 100 MG PO TABS: 100.0000 mg | ORAL_TABLET | Freq: Every day | ORAL | 0 refills | Status: DC

## 2019-09-23 MED ORDER — GABAPENTIN 300 MG PO CAPS: 300.0000 mg | ORAL_CAPSULE | Freq: Three times a day (TID) | ORAL | 0 refills | Status: AC

## 2019-09-23 MED ORDER — FOLIC ACID 1 MG PO TABS: 1.0000 mg | ORAL_TABLET | Freq: Every day | ORAL | 0 refills | Status: AC

## 2019-09-23 MED ORDER — GUAIFENESIN-DM 100-10 MG/5ML PO SYRP: 5.0000 mL | ORAL_SOLUTION | ORAL | 0 refills | Status: AC | PRN

## 2019-09-23 NOTE — Plan of Care (Signed)
  Problem: Education: Goal: Knowledge of disease or condition will improve Outcome: Adequate for Discharge Goal: Understanding of discharge needs will improve Outcome: Adequate for Discharge   Problem: Health Behavior/Discharge Planning: Goal: Ability to identify changes in lifestyle to reduce recurrence of condition will improve Outcome: Adequate for Discharge Goal: Identification of resources available to assist in meeting health care needs will improve Outcome: Adequate for Discharge   Problem: Physical Regulation: Goal: Complications related to the disease process, condition or treatment will be avoided or minimized Outcome: Adequate for Discharge   Problem: Safety: Goal: Ability to remain free from injury will improve Outcome: Adequate for Discharge   Problem: Education: Goal: Knowledge of General Education information will improve Description: Including pain rating scale, medication(s)/side effects and non-pharmacologic comfort measures Outcome: Adequate for Discharge   Problem: Health Behavior/Discharge Planning: Goal: Ability to manage health-related needs will improve Outcome: Adequate for Discharge   Problem: Clinical Measurements: Goal: Ability to maintain clinical measurements within normal limits will improve Outcome: Adequate for Discharge Goal: Will remain free from infection Outcome: Adequate for Discharge Goal: Diagnostic test results will improve Outcome: Adequate for Discharge Goal: Respiratory complications will improve Outcome: Adequate for Discharge Goal: Cardiovascular complication will be avoided Outcome: Adequate for Discharge   Problem: Activity: Goal: Risk for activity intolerance will decrease Outcome: Adequate for Discharge   Problem: Nutrition: Goal: Adequate nutrition will be maintained Outcome: Adequate for Discharge   Problem: Coping: Goal: Level of anxiety will decrease Outcome: Adequate for Discharge   Problem:  Elimination: Goal: Will not experience complications related to bowel motility Outcome: Adequate for Discharge Goal: Will not experience complications related to urinary retention Outcome: Adequate for Discharge   Problem: Pain Managment: Goal: General experience of comfort will improve Outcome: Adequate for Discharge   Problem: Safety: Goal: Ability to remain free from injury will improve Outcome: Adequate for Discharge   Problem: Skin Integrity: Goal: Risk for impaired skin integrity will decrease Outcome: Adequate for Discharge   

## 2019-09-23 NOTE — TOC Transition Note (Signed)
Transition of Care [facility]) - CM/SW Discharge Note   Patient Details  Name: Samuel Reyes MRN: [id number] Date of Birth: 1956/02/20  Transition of [facility]) CM/SW Contact:  Trish Mage, LCSW Phone Number: 09/23/2019, 10:07 AM   Clinical Narrative:  Patient to transfer today to [facility]. I got him some clothing, shoes out of clothing closet as he has none. PTAR arranged. Nursing, please call report to [phone], room 126B. TOC sing off.    Final next level of care: Skilled Nursing Facility Barriers to Discharge: Barriers Resolved   Patient Goals and CMS Choice        Discharge Placement                       Discharge Plan and Services In-house Referral: Clinical Social Work                                   Social Determinants of Health (SDOH) Interventions     Readmission Risk Interventions No flowsheet data found.

## 2019-09-23 NOTE — Discharge Summary (Signed)
Physician Discharge Summary  Samuel Reyes [id number] DOB: 01-13-1956 DOA: 09/13/2019  PCP: [name], NP  Admit date: 09/13/2019 Discharge date: 09/23/2019  Admitted From: Homeless, found on the streets Disposition: [facility]  Recommendations for Outpatient Follow-up:  1. Follow up with PCP in 1-2 weeks 2. Recommend referral outpatient psychiatry 3. Continue to encourage alcohol cessation, recommend outpatient substance abuse program  Home Health: No Equipment/Devices: None  Discharge Condition: Stable CODE STATUS: Full code Diet recommendation: Heart healthy diet  History of present illness:  Samuel Reyes is a 64 y.o.malewith medical history significant ofCOPD, HTN, GERD, CVA, depression, CAD, alcoholism, h/o DT's, Barrett's esophagus found down near [facility] in a yard. Was wet and cold and noted to have core temp of 87 degrees.   In ED treated with [facility], warmed fluids. Noted to have alcohol level of 327 and lactic acid of 2.8 without concomitant leukocytosis. Recent admission for the same 08/01/2019, with transfer to behavioral health hospital for 4 days of inpatient prior to discharge. He has known stressors including loss of child/wife, homelessness and ongoing alcohol use.  Hospital course:  Hypothermia: Resolved Etiology likely secondary to cold exposure as he was found outside for likely prolonged period of time frame given his homelessness.  Was actively rewarmed and fluid resuscitated on presentation.  Urinalysis not suggestive of infection and without leukocytosis.  Urine culture with no growth.  Blood culture showed no growth x5 days.  Acute EtOH intoxication Patient with elevated EtOH level of 327 on admission.  History of significant DTs.  Was monitored during hospitalization under [facility] protocol; no signs of withdrawal symptoms. Thiamine, folate, multivitamin.  Continue encourage alcohol cessation, recommend outpatient  substance abuse program.  Depression/suicidal ideation Patient with long history of depression and chronic suicidal ideations.  Recent admission to behavioral health Hospital early January, states thoughts of suicidal ideation never really go away.  Has reportedly thoughts of intense with cutting wrists and running into traffic.  Has been seen by psychiatry on 2 occasions during this hospitalization on 09/14/2019 and 09/16/2019; and cleared psychiatrically for discharge.  Continue gabapentin 300 mg p.o. 3 times daily and Prozac 10 mg p.o. daily.  Recommend outpatient follow-up with psychiatry and substance abuse program  Chest pain Hx CAD/MI Patient reports chest pain on presentation, was noted to be significantly hypothermic secondary to cold exposure.  EKG without concerning ST elevation/depression or T wave inversions.  Troponin was minimally elevated which down trended which is inconsistent with ACS.  TTE with normal EF, no regional wall motion abnormalities.  Currently chest pain-free.  Acute COPD exacerbation Currently oxygenating well on room air.  Completed 5-day course of prednisone 40 mg. Continue Breo Ellipta 1 puff daily  GERD: Continue PPI  Essential hypertension Was previously on Norvasc, held secondary to hypotension on presentation with associated hypothermia.  Remained normotensive during hospitalization, Norvasc discontinued.  Monitor blood pressure following discharge.  Elevated LFTs Etiology likely secondary to continued alcohol abuse.  Acute hepatitis panel negative.    Hypomagnesemia Repleted during hospitalization.  Leukocytosis Etiology likely secondary to steroid-induced from treatment of COPD exacerbation as above.  Tobacco use disorder: Continue nicotine patch  Weakness, debility, gait disturbance: Evaluated by PT, recommending SNF placement.  Discharge to [facility].  Discharge Diagnoses:  Active Problems:   COPD (chronic obstructive  pulmonary disease) (HCC)   GERD (gastroesophageal reflux disease)   Benign essential HTN   Alcohol use disorder, severe, dependence ([place])   Major depressive disorder, recurrent episode, moderate  with anxious distress ([place])   Tobacco abuse   Homelessness    Discharge Instructions  Discharge Instructions    Diet - low sodium heart healthy   Complete by: As directed    Increase activity slowly   Complete by: As directed      Allergies as of 09/23/2019      Reactions   Bee Venom Anaphylaxis   Penicillins Rash   Has patient had a PCN reaction causing immediate rash, facial/tongue/throat swelling, SOB or lightheadedness with hypotension: {Yes Has patient had a PCN reaction causing severe rash involving mucus membranes or skin necrosis: YES Has patient had a PCN reaction that required hospitalization Yes Has patient had a PCN reaction occurring within the last 10 years: YES If all of the above answers are "NO", then may proceed with Cephalosporin use.   Vancomycin Tinitus   Pt states he "almost went deaf" - does not want to take vanc      Medication List    STOP taking these medications   amLODipine 5 MG tablet Commonly known as: NORVASC   sertraline 50 MG tablet Commonly known as: ZOLOFT     TAKE these medications   albuterol 108 (90 Base) MCG/ACT inhaler Commonly known as: VENTOLIN HFA Inhale 1-2 puffs into the lungs every 6 (six) hours as needed for wheezing or shortness of breath.   FLUoxetine 20 MG capsule Commonly known as: PROZAC Take 1 capsule (20 mg total) by mouth daily.   fluticasone furoate-vilanterol 200-25 MCG/INH Aepb Commonly known as: BREO ELLIPTA Inhale 1 puff into the lungs daily.   folic acid 1 MG tablet Commonly known as: FOLVITE Take 1 tablet (1 mg total) by mouth daily.   gabapentin 300 MG capsule Commonly known as: NEURONTIN Take 1 capsule (300 mg total) by mouth 3 (three) times daily.   guaiFENesin-dextromethorphan 100-10 MG/5ML  syrup Commonly known as: ROBITUSSIN DM Take 5 mLs by mouth every 4 (four) hours as needed for cough (chest congestion).   HYDROcodone-acetaminophen 5-325 MG tablet Commonly known as: NORCO/VICODIN Take 1 tablet by mouth every 8 (eight) hours as needed for severe pain.   multivitamin with minerals Tabs tablet Take 1 tablet by mouth daily.   nicotine 21 mg/24hr patch Commonly known as: NICODERM CQ - dosed in mg/24 hours Place 1 patch (21 mg total) onto the skin daily for 14 days.   pantoprazole 40 MG tablet Commonly known as: PROTONIX Take 1 tablet (40 mg total) by mouth 2 (two) times daily.   thiamine 100 MG tablet Take 1 tablet (100 mg total) by mouth daily.   traZODone 100 MG tablet Commonly known as: DESYREL Take 1 tablet (100 mg total) by mouth at bedtime. What changed:   medication strength  how much to take  when to take this  reasons to take this      Follow-up Information    [name], NP. Schedule an appointment as soon as possible for a visit in 1 week(s).   Specialty: Internal Medicine Contact information: [address] [address] [phone]          Allergies  Allergen Reactions  . Bee Venom Anaphylaxis  . Penicillins Rash    Has patient had a PCN reaction causing immediate rash, facial/tongue/throat swelling, SOB or lightheadedness with hypotension: {Yes Has patient had a PCN reaction causing severe rash involving mucus membranes or skin necrosis: YES Has patient had a PCN reaction that required hospitalization Yes Has patient had a PCN reaction occurring  within the last 10 years: YES If all of the above answers are "NO", then may proceed with Cephalosporin use.   . Vancomycin Tinitus    Pt states he "almost went deaf" - does not want to take vanc    Consultations:  Psychiatry   Procedures/Studies: DG Chest 1 View  Result Date: 09/13/2019 CLINICAL DATA:  Shortness of breath. EXAM: CHEST  1 VIEW COMPARISON:   August 01, 2019 FINDINGS: The heart size and mediastinal contours are within normal limits. Both lungs are clear. The visualized skeletal structures are unremarkable. IMPRESSION: No active disease. Electronically Signed   By: Abelardo Diesel M.D.   On: 09/13/2019 16:50   DG Abd 1 View  Result Date: 09/16/2019 CLINICAL DATA:  Abdominal pain, distention EXAM: ABDOMEN - 1 VIEW COMPARISON:  None. FINDINGS: Gaseous distension of bowel. This is slightly more prominent within small bowel loops. Findings may reflect ileus although small-bowel obstruction is difficult to exclude. No organomegaly or free air. IMPRESSION: Distended gas-filled bowel loops, predominantly small bowel. Differential considerations would include ileus or small bowel obstruction. Electronically Signed   By: Rolm Baptise M.D.   On: 09/16/2019 11:19   DG CHEST PORT 1 VIEW  Result Date: 09/14/2019 CLINICAL DATA:  Aspiration EXAM: PORTABLE CHEST 1 VIEW COMPARISON:  09/13/2019 FINDINGS: Frontal view of the chest demonstrates an unremarkable cardiac silhouette. No airspace disease, effusion, or pneumothorax. No evidence of aspiration. IMPRESSION: 1. No acute intrathoracic process. Electronically Signed   By: Randa Ngo M.D.   On: 09/14/2019 22:28   ECHOCARDIOGRAM COMPLETE  Result Date: 09/14/2019    ECHOCARDIOGRAM REPORT   Patient Name:   SAABIR BATZ Date of Exam: 09/14/2019 Medical Rec #:  [id number]      Height:       64.0 in Accession #:    [id number]     Weight:       150.0 lb Date of Birth:  January 17, 1956     BSA:          1.73 m Patient Age:    66 years       BP:           174/87 mmHg Patient Gender: M              HR:           96 bpm. Exam Location:  Inpatient Procedure: 2D Echo, Color Doppler and Cardiac Doppler Indications:    R07.9* Chest pain, unspecified  History:        Patient has prior history of Echocardiogram examinations, most                 recent 08/03/2016. Signs/Symptoms:Chest Pain; Risk                  Factors:Hypertension. ETOH and tobacco abuse.  Sonographer:    Raquel Sarna Senior RDCS Referring Phys: [phone] A CALDWELL [place]  1. Left ventricular ejection fraction, by estimation, is 60 to 65%. The left ventricle has normal function. The left ventricle has no regional wall motion abnormalities. Left ventricular diastolic parameters were normal.  2. Right ventricular systolic function is normal. The right ventricular size is normal. There is normal pulmonary artery systolic pressure. The estimated right ventricular systolic pressure is [id number] mmHg.  3. The mitral valve is grossly normal. No evidence of mitral valve regurgitation.  4. The aortic valve is tricuspid. Aortic valve regurgitation is not visualized. No aortic stenosis is present.  5. The inferior vena cava is  dilated in size with >50% respiratory variability, suggesting right atrial pressure of 8 mmHg. Comparison(s): A prior study was performed on 08/03/2016. Prior images unable to be directly viewed, comparison made by report only. No significant change from prior study. FINDINGS  Left Ventricle: Left ventricular ejection fraction, by estimation, is 60 to 65%. The left ventricle has normal function. The left ventricle has no regional wall motion abnormalities. The left ventricular internal cavity size was normal in size. There is  no left ventricular hypertrophy. Left ventricular diastolic parameters were normal. Right Ventricle: The right ventricular size is normal. No increase in right ventricular wall thickness. Right ventricular systolic function is normal. There is normal pulmonary artery systolic pressure. The tricuspid regurgitant velocity is 2.20 m/s, and  with an assumed right atrial pressure of 8 mmHg, the estimated right ventricular systolic pressure is [id number] mmHg. Left Atrium: Left atrial size was normal in size. Right Atrium: Right atrial size was normal in size. Pericardium: Trivial pericardial effusion is present. Presence of  pericardial fat pad. Mitral Valve: The mitral valve is grossly normal. No evidence of mitral valve regurgitation. Tricuspid Valve: The tricuspid valve is grossly normal. Tricuspid valve regurgitation is trivial. Aortic Valve: The aortic valve is tricuspid. Aortic valve regurgitation is not visualized. No aortic stenosis is present. Pulmonic Valve: The pulmonic valve was grossly normal. Pulmonic valve regurgitation is not visualized. Aorta: The aortic root and ascending aorta are structurally normal, with no evidence of dilitation. Venous: The inferior vena cava is dilated in size with greater than 50% respiratory variability, suggesting right atrial pressure of 8 mmHg. IAS/Shunts: No atrial level shunt detected by color flow Doppler.  LEFT VENTRICLE PLAX 2D LVIDd:         4.50 cm  Diastology LVIDs:         3.30 cm  LV e' lateral:   12.30 cm/s LV PW:         0.80 cm  LV E/e' lateral: 5.9 LV IVS:        0.70 cm  LV e' medial:    12.90 cm/s LVOT diam:     2.30 cm  LV E/e' medial:  5.6 LV SV:         73.95 ml LV SV Index:   27.35 LVOT Area:     4.15 cm  RIGHT VENTRICLE RV S prime:     15.90 cm/s TAPSE (M-mode): 2.4 cm LEFT ATRIUM             Index       RIGHT ATRIUM           Index LA diam:        2.70 cm 1.56 cm/m  RA Area:     16.70 cm LA Vol (A2C):   36.4 ml 21.03 ml/m RA Volume:   45.70 ml  26.40 ml/m LA Vol (A4C):   38.1 ml 22.01 ml/m LA Biplane Vol: 40.7 ml 23.51 ml/m  AORTIC VALVE LVOT Vmax:   102.00 cm/s LVOT Vmean:  70.000 cm/s LVOT VTI:    0.178 m  AORTA Ao Root diam: 3.20 cm Ao Asc diam:  3.20 cm MITRAL VALVE               TRICUSPID VALVE MV Area (PHT): 4.21 cm    TR Peak grad:   19.4 mmHg MV Decel Time: 180 msec    TR Vmax:        220.00 cm/s MV E velocity: 72.40 cm/s MV A velocity: 71.10 cm/s  SHUNTS MV E/A ratio:  1.02        Systemic VTI:  0.18 m                            Systemic Diam: 2.30 cm Eleonore Chiquito MD Electronically signed by Eleonore Chiquito MD Signature Date/Time: 09/14/2019/6:14:14 PM     Final       Subjective: Patient seen and examined bedside, resting comfortably.  Ready for discharge to SNF today.  No complaints.  Denies headache, no chest pain, no palpitations, no shortness of breath, no abdominal pain, no suicidal/homicidal ideations.  No acute events overnight per nursing staff.  Discharge Exam: Vitals:   09/23/19 0539 09/23/19 0833  BP: 130/76   Pulse: 79   Resp: 18   Temp: 97.8 F (36.6 C)   SpO2: 98% 99%   Vitals:   09/22/19 1410 09/22/19 2042 09/23/19 0539 09/23/19 0833  BP: 111/69 (!) 149/87 130/76   Pulse: 91 85 79   Resp: 18 18 18    Temp: 98.1 F (36.7 C) 97.9 F (36.6 C) 97.8 F (36.6 C)   TempSrc: Oral     SpO2: 97% 99% 98% 99%  Weight:      Height:        General: Pt is alert, awake, not in acute distress Cardiovascular: RRR, S1/S2 +, no rubs, no gallops Respiratory: CTA bilaterally, no wheezing, no rhonchi Abdominal: Soft, NT, ND, bowel sounds + Extremities: no edema, no cyanosis    The results of significant diagnostics from this hospitalization (including imaging, microbiology, ancillary and laboratory) are listed below for reference.     Microbiology: Recent Results (from the past 240 hour(s))  MRSA PCR Screening     Status: None   Collection Time: 09/13/19 12:50 PM   Specimen: Nasal Mucosa; Nasopharyngeal  Result Value Ref Range Status   MRSA by PCR NEGATIVE NEGATIVE Final    Comment:        The GeneXpert MRSA Assay (FDA approved for NASAL specimens only), is one component of a comprehensive MRSA colonization surveillance program. It is not intended to diagnose MRSA infection nor to guide or monitor treatment for MRSA infections. Performed at [facility], [place] [address] [place], [place] [address]   Culture, Urine     Status: None   Collection Time: 09/13/19  4:12 PM   Specimen: Urine, Random  Result Value Ref Range Status   Specimen Description   Final    URINE, RANDOM Performed at [place] [address] [place], [place] [address]    Special Requests   Final    Normal Performed at [facility], [place] [address], [place], [place] [address]    Culture   Final    NO GROWTH Performed at [facility], [place] [address], [place], [place] [address]    Report Status 09/14/2019 FINAL  Final  Culture, blood (routine x 2)     Status: None   Collection Time: 09/13/19  4:53 PM   Specimen: BLOOD  Result Value Ref Range Status   Specimen Description BLOOD RIGHT ARM  Final   Special Requests   Final    BOTTLES DRAWN AEROBIC AND ANAEROBIC Blood Culture adequate volume Performed at [facility] [address], [place], [place] [address]    Culture NO GROWTH 5 DAYS  Final   Report Status 09/19/2019 FINAL  Final  Culture, blood (routine x  2)     Status: None   Collection Time: 09/13/19  4:56 PM   Specimen: BLOOD  Result Value Ref Range Status   Specimen Description BLOOD RIGHT HAND  Final   Special Requests   Final    BOTTLES DRAWN AEROBIC AND ANAEROBIC Blood Culture adequate volume Performed at [facility] [address], [place], [place] [address]    Culture NO GROWTH 5 DAYS  Final   Report Status 09/19/2019 FINAL  Final  SARS CORONAVIRUS 2 (TAT 6-24 HRS) Nasopharyngeal Nasopharyngeal Swab     Status: None   Collection Time: 09/22/19 12:44 PM   Specimen: Nasopharyngeal Swab  Result Value Ref Range Status   SARS Coronavirus 2 NEGATIVE NEGATIVE Final    Comment: (NOTE) SARS-CoV-2 target nucleic acids are NOT DETECTED. The SARS-CoV-2 RNA is generally detectable in upper and lower respiratory specimens during the acute phase of infection. Negative results do not preclude SARS-CoV-2 infection, do not rule out co-infections with other pathogens, and should not be used as the sole basis for treatment or other patient management decisions. Negative results must be combined with clinical  observations, patient history, and epidemiological information. The expected result is Negative. Fact Sheet for Patients: SugarRoll.be Fact Sheet for Healthcare Providers: https://www.woods-mathews.com/ This test is not yet approved or cleared by the [place] FDA and  has been authorized for detection and/or diagnosis of SARS-CoV-2 by FDA under an Emergency Use Authorization (EUA). This EUA will remain  in effect (meaning this test can be used) for the duration of the COVID-19 declaration under Section 56 4(b)(1) of the Act, 21 U.S.C. section 360bbb-3(b)(1), unless the authorization is terminated or revoked sooner. Performed at [facility], [place] [address], [place], [place] [address]      Labs: BNP (last 3 results) Recent Labs    07/08/19 1222  BNP [id number]   Basic Metabolic Panel: Recent Labs  Lab 09/17/19 0441 09/18/19 0442 09/20/19 0529  NA 138 139  --   K 3.7 3.5  --   CL 104 107  --   CO2 25 25  --   GLUCOSE 81 92  --   BUN 13 17  --   CREATININE 0.61 0.67 0.79  CALCIUM 8.2* 8.2*  --   MG 1.7 1.8  --    Liver Function Tests: Recent Labs  Lab 09/17/19 0441  AST 26  ALT 37  ALKPHOS 69  BILITOT 0.5  PROT 5.7*  ALBUMIN 2.6*   No results for input(s): LIPASE, AMYLASE in the last 168 hours. No results for input(s): AMMONIA in the last 168 hours. CBC: Recent Labs  Lab 09/17/19 0441  WBC 9.4  HGB 10.6*  HCT 34.3*  MCV 87.9  PLT 329   Cardiac Enzymes: No results for input(s): CKTOTAL, CKMB, CKMBINDEX, TROPONINI in the last 168 hours. BNP: Invalid input(s): POCBNP CBG: No results for input(s): GLUCAP in the last 168 hours. D-Dimer No results for input(s): DDIMER in the last 72 hours. Hgb A1c No results for input(s): HGBA1C in the last 72 hours. Lipid Profile No results for input(s): CHOL, HDL, LDLCALC, TRIG, CHOLHDL, LDLDIRECT in the last 72 hours. Thyroid function studies No results for input(s):  TSH, T4TOTAL, T3FREE, THYROIDAB in the last 72 hours.  Invalid input(s): FREET3 Anemia work up No results for input(s): VITAMINB12, FOLATE, FERRITIN, TIBC, IRON, RETICCTPCT in the last 72 hours. Urinalysis    Component Value Date/Time   COLORURINE YELLOW 09/13/2019 1612   APPEARANCEUR CLOUDY (A)  09/13/2019 1612   LABSPEC 1.005 09/13/2019 1612   PHURINE 6.0 09/13/2019 1612   GLUCOSEU 50 (A) 09/13/2019 1612   HGBUR NEGATIVE 09/13/2019 1612   BILIRUBINUR NEGATIVE 09/13/2019 1612   KETONESUR NEGATIVE 09/13/2019 1612   PROTEINUR >=300 (A) 09/13/2019 1612   UROBILINOGEN 0.2 05/31/2015 2156   NITRITE NEGATIVE 09/13/2019 1612   LEUKOCYTESUR NEGATIVE 09/13/2019 1612   Sepsis Labs Invalid input(s): PROCALCITONIN,  WBC,  LACTICIDVEN Microbiology Recent Results (from the past 240 hour(s))  MRSA PCR Screening     Status: None   Collection Time: 09/13/19 12:50 PM   Specimen: Nasal Mucosa; Nasopharyngeal  Result Value Ref Range Status   MRSA by PCR NEGATIVE NEGATIVE Final    Comment:        The GeneXpert MRSA Assay (FDA approved for NASAL specimens only), is one component of a comprehensive MRSA colonization surveillance program. It is not intended to diagnose MRSA infection nor to guide or monitor treatment for MRSA infections. Performed at [facility], [place] [address], [place], [place] [address]   Culture, Urine     Status: None   Collection Time: 09/13/19  4:12 PM   Specimen: Urine, Random  Result Value Ref Range Status   Specimen Description   Final    URINE, RANDOM Performed at [facility] [address], [place], [place] [address]    Special Requests   Final    Normal Performed at [facility], [place] [address], [place], [place]  [address]    Culture   Final    NO GROWTH Performed at [facility], [place] [address], [place], [place] [address]    Report Status 09/14/2019 FINAL  Final  Culture, blood (routine x  2)     Status: None   Collection Time: 09/13/19  4:53 PM   Specimen: BLOOD  Result Value Ref Range Status   Specimen Description BLOOD RIGHT ARM  Final   Special Requests   Final    BOTTLES DRAWN AEROBIC AND ANAEROBIC Blood Culture adequate volume Performed at [facility] [address] [place], [place] [address]    Culture NO GROWTH 5 DAYS  Final   Report Status 09/19/2019 FINAL  Final  Culture, blood (routine x 2)     Status: None   Collection Time: 09/13/19  4:56 PM   Specimen: BLOOD  Result Value Ref Range Status   Specimen Description BLOOD RIGHT HAND  Final   Special Requests   Final    BOTTLES DRAWN AEROBIC AND ANAEROBIC Blood Culture adequate volume Performed at [facility] [address], [place], [place] [address]    Culture NO GROWTH 5 DAYS  Final   Report Status 09/19/2019 FINAL  Final  SARS CORONAVIRUS 2 (TAT 6-24 HRS) Nasopharyngeal Nasopharyngeal Swab     Status: None   Collection Time: 09/22/19 12:44 PM   Specimen: Nasopharyngeal Swab  Result Value Ref Range Status   SARS Coronavirus 2 NEGATIVE NEGATIVE Final    Comment: (NOTE) SARS-CoV-2 target nucleic acids are NOT DETECTED. The SARS-CoV-2 RNA is generally detectable in upper and lower respiratory specimens during the acute phase of infection. Negative results do not preclude SARS-CoV-2 infection, do not rule out co-infections with other pathogens, and should not be used as the sole basis for treatment or other patient management decisions. Negative results must be combined with clinical observations, patient history, and epidemiological information. The expected result is Negative. Fact Sheet for Patients: SugarRoll.be Fact  Sheet for Healthcare Providers: https://www.woods-mathews.com/ This test is not yet approved or cleared by the [place] FDA and  has been authorized for detection and/or diagnosis of SARS-CoV-2  by FDA under an Emergency Use Authorization (EUA). This EUA will remain  in effect (meaning this test can be used) for the duration of the COVID-19 declaration under Section 56 4(b)(1) of the Act, 21 U.S.C. section 360bbb-3(b)(1), unless the authorization is terminated or revoked sooner. Performed at [facility], [place] [address], [place], [place] [address]      Time coordinating discharge: Over 30 minutes  SIGNED:   Donnamarie Poag [place] DO  [facility] 09/23/2019, 9:36 AM

## 2019-09-23 NOTE — Progress Notes (Signed)
Scherrie Merritts to be D/C'd Skilled nursing facility per MD order.  Discussed prescriptions and follow up appointments with the patient. Prescriptions given to patient, medication list explained in detail. Pt verbalized understanding.  Allergies as of 09/23/2019      Reactions   Bee Venom Anaphylaxis   Penicillins Rash   Has patient had a PCN reaction causing immediate rash, facial/tongue/throat swelling, SOB or lightheadedness with hypotension: {Yes Has patient had a PCN reaction causing severe rash involving mucus membranes or skin necrosis: YES Has patient had a PCN reaction that required hospitalization Yes Has patient had a PCN reaction occurring within the last 10 years: YES If all of the above answers are "NO", then may proceed with Cephalosporin use.   Vancomycin Tinitus   Pt states he "almost went deaf" - does not want to take vanc      Medication List    STOP taking these medications   amLODipine 5 MG tablet Commonly known as: NORVASC   sertraline 50 MG tablet Commonly known as: ZOLOFT     TAKE these medications   albuterol 108 (90 Base) MCG/ACT inhaler Commonly known as: VENTOLIN HFA Inhale 1-2 puffs into the lungs every 6 (six) hours as needed for wheezing or shortness of breath.   FLUoxetine 20 MG capsule Commonly known as: PROZAC Take 1 capsule (20 mg total) by mouth daily.   fluticasone furoate-vilanterol 200-25 MCG/INH Aepb Commonly known as: BREO ELLIPTA Inhale 1 puff into the lungs daily.   folic acid 1 MG tablet Commonly known as: FOLVITE Take 1 tablet (1 mg total) by mouth daily.   gabapentin 300 MG capsule Commonly known as: NEURONTIN Take 1 capsule (300 mg total) by mouth 3 (three) times daily.   guaiFENesin-dextromethorphan 100-10 MG/5ML syrup Commonly known as: ROBITUSSIN DM Take 5 mLs by mouth every 4 (four) hours as needed for cough (chest congestion).   HYDROcodone-acetaminophen 5-325 MG tablet Commonly known as: NORCO/VICODIN Take 1 tablet  by mouth every 8 (eight) hours as needed for severe pain.   multivitamin with minerals Tabs tablet Take 1 tablet by mouth daily.   nicotine 21 mg/24hr patch Commonly known as: NICODERM CQ - dosed in mg/24 hours Place 1 patch (21 mg total) onto the skin daily for 14 days.   pantoprazole 40 MG tablet Commonly known as: PROTONIX Take 1 tablet (40 mg total) by mouth 2 (two) times daily.   thiamine 100 MG tablet Take 1 tablet (100 mg total) by mouth daily.   traZODone 100 MG tablet Commonly known as: DESYREL Take 1 tablet (100 mg total) by mouth at bedtime. What changed:   medication strength  how much to take  when to take this  reasons to take this       Vitals:   09/23/19 0539 09/23/19 0833  BP: 130/76   Pulse: 79   Resp: 18   Temp: 97.8 F (36.6 C)   SpO2: 98% 99%    Site without signs and symptoms of complications. Dressing and pressure applied. Pt denies pain at this time. No complaints noted. Report called and given to receiving RN at facility. RN denies having any questions or concerns.   An After Visit Summary was printed and given to PTAR. Pt to transfer to facility via [facility] [place] [facility].   Lolita Rieger 09/23/2019 11:52 AM

## 2019-10-15 IMAGING — CR DG CHEST 2V
2 series · 2 of 2 positions shown · non-contrast
Comparison: Chest radiograph 05/17/2017

CLINICAL DATA: Shortness of breath

EXAM:
CHEST  2 VIEW

[w chest pa]
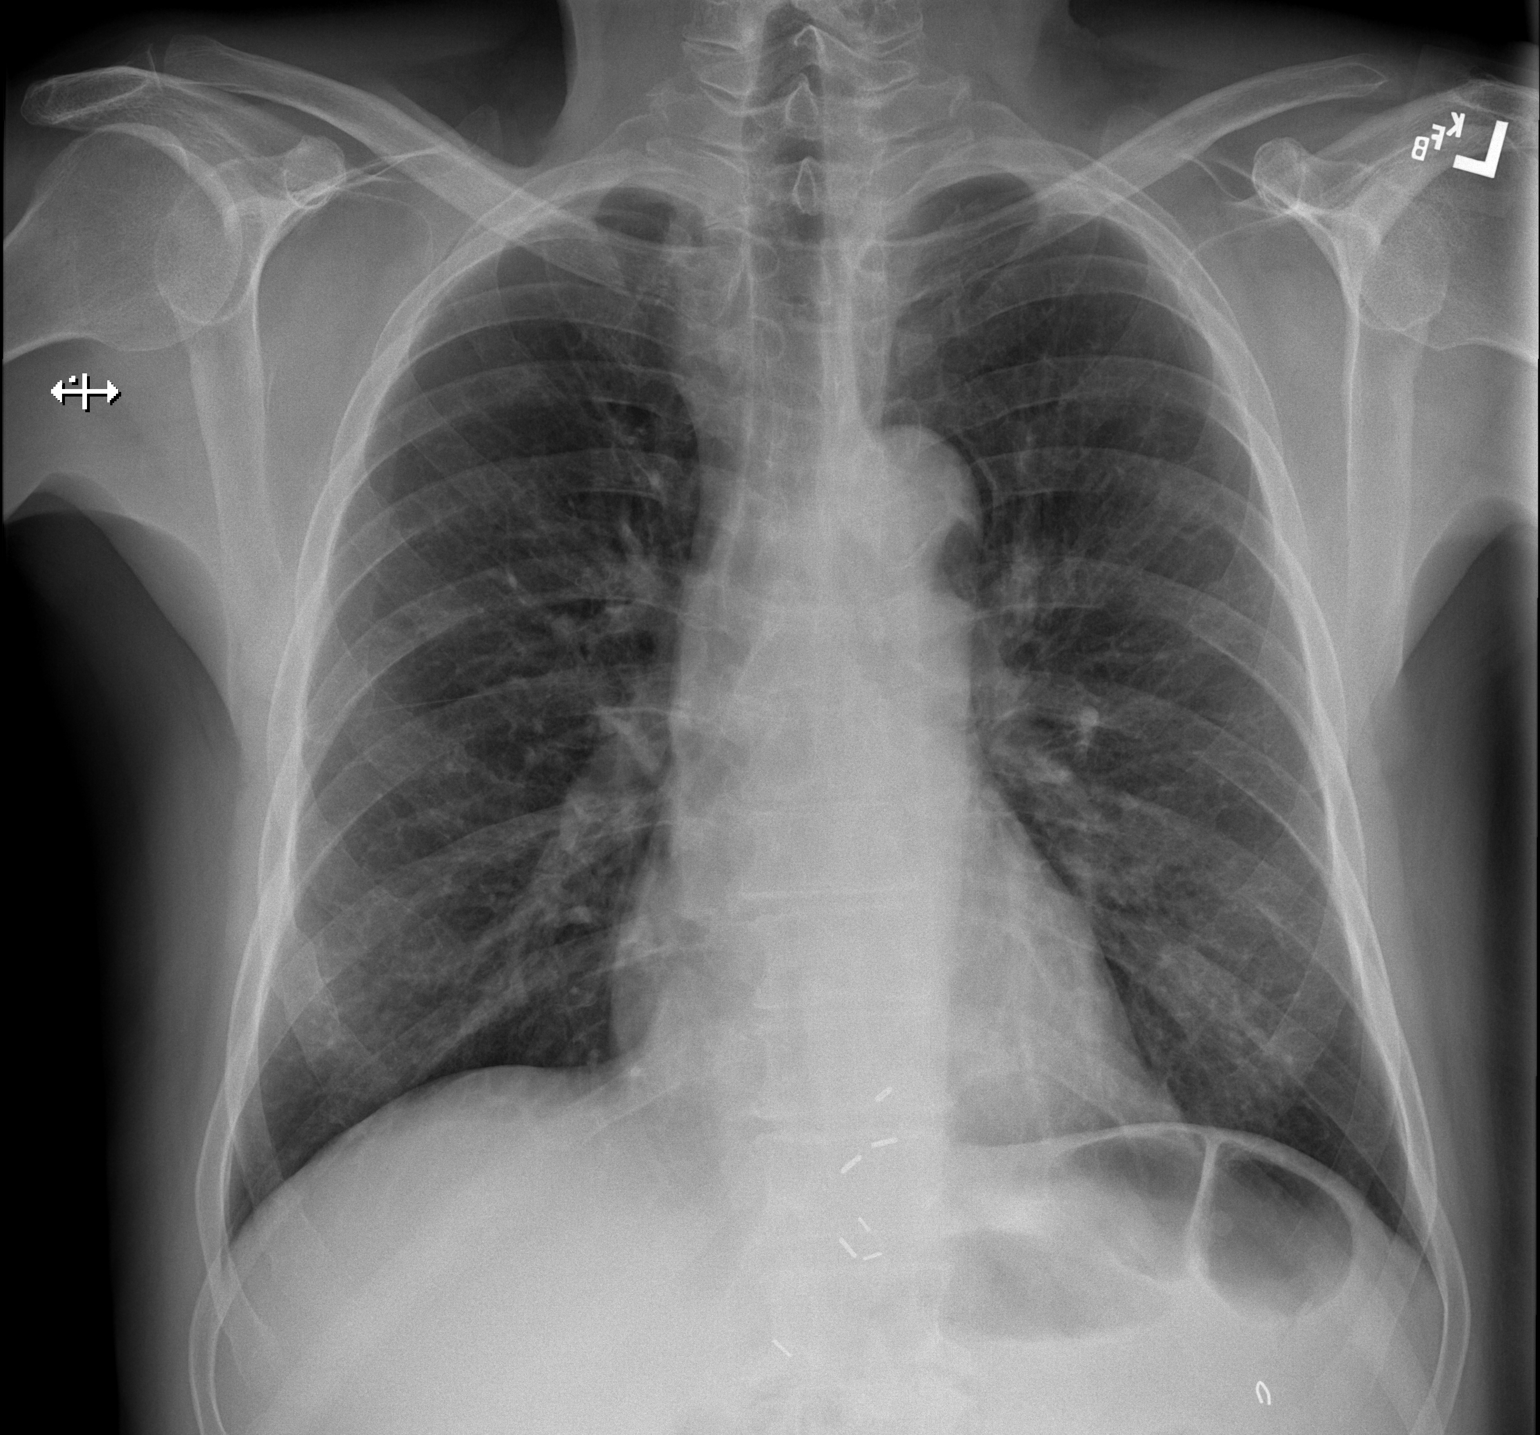

[w chest lat]
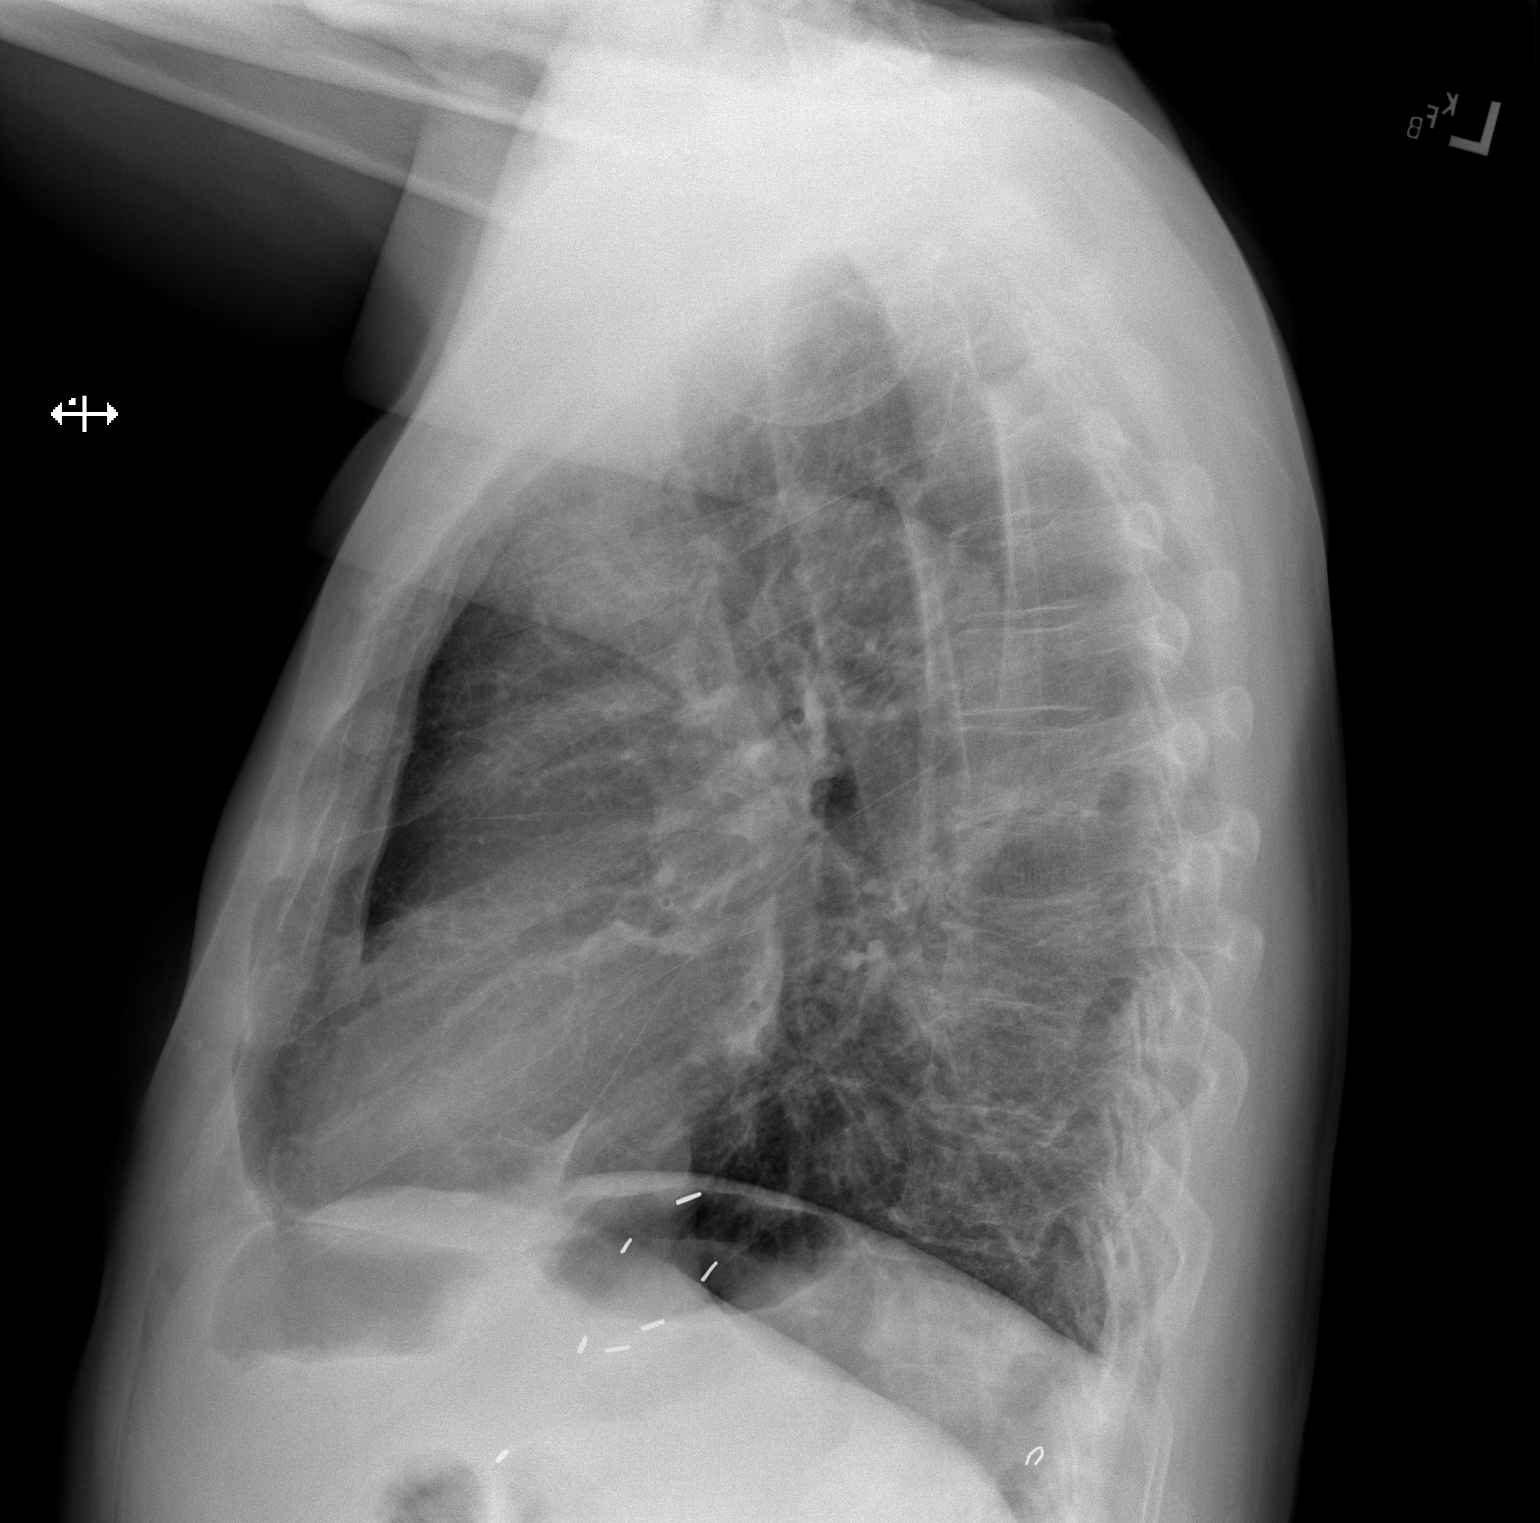

[2 of 2 positions shown; findings below may reference images not displayed]

FINDINGS: The heart size and mediastinal contours are within normal limits.
Both lungs are clear. The visualized skeletal structures are
unremarkable. The lungs are hyperinflated, consistent with COPD.
IMPRESSION: Unchanged appearance of the chest without acute cardiopulmonary
disease. COPD.

## 2019-10-25 IMAGING — CR DG CHEST 2V
1 series · 1 of 1 positions shown · non-contrast
Comparison: 06/14/2017

CLINICAL DATA: Chest pain and shortness of breath

EXAM:
CHEST  2 VIEW

[w chest pa]
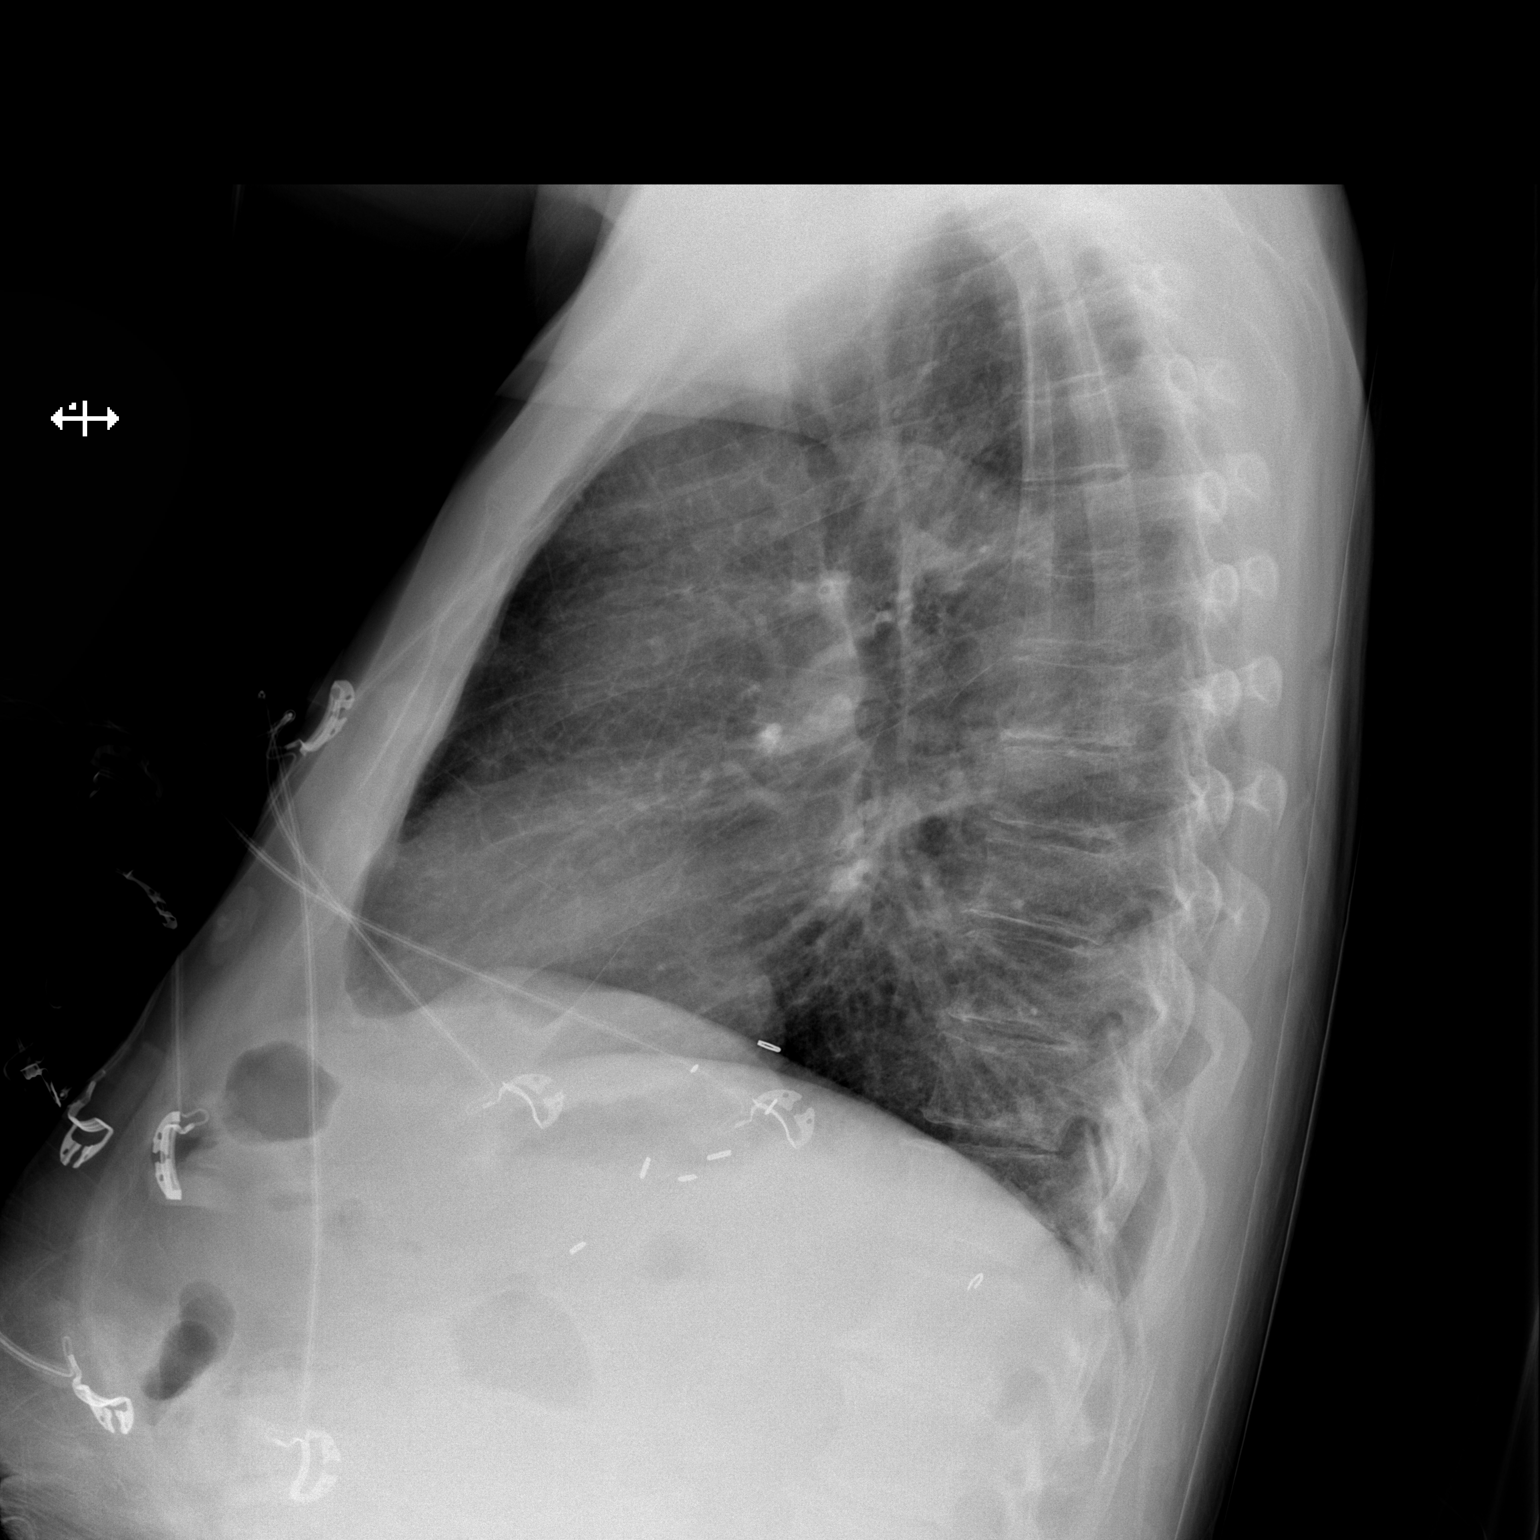

[1 of 1 positions shown; findings below may reference images not displayed]

FINDINGS: Normal heart size and mediastinal contours. There is no edema,
consolidation, effusion, or pneumothorax. Chronic airway thickening.
There were multiple centrilobular/tree-in-bud nodules by 08/21/2016
chest CT, largely occult by radiography. Surgical clips at the GE
junction. Remote right anterior sixth and seventh rib fractures. No
acute osseous finding.
IMPRESSION: 1. No acute finding when compared to prior.
2. Chronic bronchitic markings.

## 2022-11-28 DEATH — deceased

## 2024-02-18 ENCOUNTER — Other Ambulatory Visit (HOSPITAL_COMMUNITY): Payer: Self-pay
